# Patient Record
Sex: Female | Born: 1935
Health system: Southern US, Community
[De-identification: ages and names within clinical notes are randomized; demographics above are authoritative.]

## PROBLEM LIST (undated history)

## (undated) DIAGNOSIS — K559 Vascular disorder of intestine, unspecified: Secondary | ICD-10-CM

## (undated) DIAGNOSIS — H4020X0 Unspecified primary angle-closure glaucoma, stage unspecified: Secondary | ICD-10-CM

## (undated) DIAGNOSIS — F329 Major depressive disorder, single episode, unspecified: Secondary | ICD-10-CM

## (undated) DIAGNOSIS — G510 Bell's palsy: Secondary | ICD-10-CM

## (undated) DIAGNOSIS — M7060 Trochanteric bursitis, unspecified hip: Secondary | ICD-10-CM

## (undated) DIAGNOSIS — M810 Age-related osteoporosis without current pathological fracture: Secondary | ICD-10-CM

## (undated) DIAGNOSIS — N390 Urinary tract infection, site not specified: Secondary | ICD-10-CM

## (undated) DIAGNOSIS — F410 Panic disorder [episodic paroxysmal anxiety] without agoraphobia: Secondary | ICD-10-CM

## (undated) DIAGNOSIS — I809 Phlebitis and thrombophlebitis of unspecified site: Secondary | ICD-10-CM

## (undated) DIAGNOSIS — I499 Cardiac arrhythmia, unspecified: Secondary | ICD-10-CM

## (undated) DIAGNOSIS — K589 Irritable bowel syndrome without diarrhea: Secondary | ICD-10-CM

## (undated) DIAGNOSIS — G20 Parkinson's disease: Secondary | ICD-10-CM

## (undated) DIAGNOSIS — K269 Duodenal ulcer, unspecified as acute or chronic, without hemorrhage or perforation: Secondary | ICD-10-CM

## (undated) DIAGNOSIS — I493 Ventricular premature depolarization: Secondary | ICD-10-CM

## (undated) DIAGNOSIS — M797 Fibromyalgia: Secondary | ICD-10-CM

## (undated) DIAGNOSIS — N83202 Unspecified ovarian cyst, left side: Secondary | ICD-10-CM

## (undated) DIAGNOSIS — B029 Zoster without complications: Secondary | ICD-10-CM

## (undated) DIAGNOSIS — F32A Depression, unspecified: Secondary | ICD-10-CM

## (undated) DIAGNOSIS — I739 Peripheral vascular disease, unspecified: Secondary | ICD-10-CM

## (undated) DIAGNOSIS — R5381 Other malaise: Secondary | ICD-10-CM

## (undated) DIAGNOSIS — T7840XA Allergy, unspecified, initial encounter: Secondary | ICD-10-CM

## (undated) DIAGNOSIS — Z8719 Personal history of other diseases of the digestive system: Secondary | ICD-10-CM

## (undated) DIAGNOSIS — E039 Hypothyroidism, unspecified: Secondary | ICD-10-CM

## (undated) DIAGNOSIS — R5382 Chronic fatigue, unspecified: Secondary | ICD-10-CM

## (undated) DIAGNOSIS — K219 Gastro-esophageal reflux disease without esophagitis: Secondary | ICD-10-CM

## (undated) DIAGNOSIS — R109 Unspecified abdominal pain: Secondary | ICD-10-CM

## (undated) DIAGNOSIS — G8929 Other chronic pain: Secondary | ICD-10-CM

## (undated) DIAGNOSIS — G43109 Migraine with aura, not intractable, without status migrainosus: Secondary | ICD-10-CM

## (undated) DIAGNOSIS — G20A1 Parkinson's disease without dyskinesia, without mention of fluctuations: Secondary | ICD-10-CM

## (undated) DIAGNOSIS — F40240 Claustrophobia: Secondary | ICD-10-CM

## (undated) HISTORY — DX: Irritable bowel syndrome, unspecified: K58.9

## (undated) HISTORY — DX: Unspecified ovarian cyst, left side: N83.202

## (undated) HISTORY — DX: Migraine with aura, not intractable, without status migrainosus: G43.109

## (undated) HISTORY — DX: Bell's palsy: G51.0

## (undated) HISTORY — DX: Vascular disorder of intestine, unspecified: K55.9

## (undated) HISTORY — DX: Gastro-esophageal reflux disease without esophagitis: K21.9

## (undated) HISTORY — PX: FRACTURE SURGERY: SHX138

## (undated) HISTORY — PX: EYE SURGERY: SHX253

## (undated) HISTORY — PX: ABDOMINAL HYSTERECTOMY: SHX81

## (undated) HISTORY — PX: WISDOM TOOTH EXTRACTION: SHX21

## (undated) HISTORY — PX: OTHER SURGICAL HISTORY: SHX169

## (undated) HISTORY — DX: Urinary tract infection, site not specified: N39.0

## (undated) HISTORY — DX: Panic disorder (episodic paroxysmal anxiety): F41.0

## (undated) HISTORY — DX: Trochanteric bursitis, unspecified hip: M70.60

## (undated) HISTORY — PX: CATARACT EXTRACTION, BILATERAL: SHX1313

## (undated) HISTORY — DX: Major depressive disorder, single episode, unspecified: F32.9

## (undated) HISTORY — DX: Peripheral vascular disease, unspecified: I73.9

## (undated) HISTORY — DX: Allergy, unspecified, initial encounter: T78.40XA

## (undated) HISTORY — DX: Hypothyroidism, unspecified: E03.9

## (undated) HISTORY — DX: Duodenal ulcer, unspecified as acute or chronic, without hemorrhage or perforation: K26.9

## (undated) HISTORY — DX: Fibromyalgia: M79.7

## (undated) HISTORY — DX: Zoster without complications: B02.9

## (undated) HISTORY — DX: Age-related osteoporosis without current pathological fracture: M81.0

## (undated) HISTORY — DX: Unspecified primary angle-closure glaucoma, stage unspecified: H40.20X0

## (undated) HISTORY — DX: Depression, unspecified: F32.A

## (undated) HISTORY — DX: Phlebitis and thrombophlebitis of unspecified site: I80.9

## (undated) HISTORY — DX: Claustrophobia: F40.240

---
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
## 1960-07-24 DIAGNOSIS — K269 Duodenal ulcer, unspecified as acute or chronic, without hemorrhage or perforation: Secondary | ICD-10-CM

## 1960-07-24 HISTORY — DX: Duodenal ulcer, unspecified as acute or chronic, without hemorrhage or perforation: K26.9

## 1964-07-24 DIAGNOSIS — G510 Bell's palsy: Secondary | ICD-10-CM

## 1964-07-24 HISTORY — DX: Bell's palsy: G51.0

## 1969-07-24 HISTORY — PX: VAGINAL HYSTERECTOMY: SHX2639

## 1997-07-24 DIAGNOSIS — B029 Zoster without complications: Secondary | ICD-10-CM

## 1997-07-24 HISTORY — DX: Zoster without complications: B02.9

## 2005-07-13 ENCOUNTER — Encounter: Admission: RE | Admit: 2005-07-13 | Discharge: 2005-07-13 | Payer: Self-pay | Admitting: Family Medicine

## 2005-07-25 ENCOUNTER — Encounter: Admission: RE | Admit: 2005-07-25 | Discharge: 2005-07-25 | Payer: Self-pay | Admitting: Family Medicine

## 2005-07-25 ENCOUNTER — Encounter (INDEPENDENT_AMBULATORY_CARE_PROVIDER_SITE_OTHER): Payer: Self-pay | Admitting: Specialist

## 2005-07-25 ENCOUNTER — Other Ambulatory Visit: Admission: RE | Admit: 2005-07-25 | Discharge: 2005-07-25 | Payer: Self-pay | Admitting: Interventional Radiology

## 2005-08-07 ENCOUNTER — Encounter (HOSPITAL_COMMUNITY): Admission: RE | Admit: 2005-08-07 | Discharge: 2005-11-05 | Payer: Self-pay | Admitting: Family Medicine

## 2005-09-21 HISTORY — PX: THYROIDECTOMY, PARTIAL: SHX18

## 2005-09-28 ENCOUNTER — Ambulatory Visit (HOSPITAL_COMMUNITY): Admission: RE | Admit: 2005-09-28 | Discharge: 2005-09-29 | Payer: Self-pay | Admitting: Surgery

## 2005-09-28 ENCOUNTER — Encounter (INDEPENDENT_AMBULATORY_CARE_PROVIDER_SITE_OTHER): Payer: Self-pay | Admitting: Specialist

## 2006-05-02 ENCOUNTER — Ambulatory Visit (HOSPITAL_COMMUNITY): Admission: RE | Admit: 2006-05-02 | Discharge: 2006-05-02 | Payer: Self-pay | Admitting: Family Medicine

## 2007-03-25 DIAGNOSIS — E039 Hypothyroidism, unspecified: Secondary | ICD-10-CM

## 2007-03-25 HISTORY — DX: Hypothyroidism, unspecified: E03.9

## 2007-05-07 ENCOUNTER — Ambulatory Visit (HOSPITAL_COMMUNITY): Admission: RE | Admit: 2007-05-07 | Discharge: 2007-05-07 | Payer: Self-pay | Admitting: Family Medicine

## 2007-12-09 ENCOUNTER — Encounter: Admission: RE | Admit: 2007-12-09 | Discharge: 2007-12-09 | Payer: Self-pay | Admitting: Family Medicine

## 2008-01-09 ENCOUNTER — Encounter: Admission: RE | Admit: 2008-01-09 | Discharge: 2008-01-09 | Payer: Self-pay | Admitting: Rheumatology

## 2008-05-07 ENCOUNTER — Ambulatory Visit (HOSPITAL_COMMUNITY): Admission: RE | Admit: 2008-05-07 | Discharge: 2008-05-07 | Payer: Self-pay | Admitting: Family Medicine

## 2008-07-24 DIAGNOSIS — I779 Disorder of arteries and arterioles, unspecified: Secondary | ICD-10-CM

## 2008-07-24 HISTORY — DX: Disorder of arteries and arterioles, unspecified: I77.9

## 2008-12-22 DIAGNOSIS — M7060 Trochanteric bursitis, unspecified hip: Secondary | ICD-10-CM

## 2008-12-22 HISTORY — DX: Trochanteric bursitis, unspecified hip: M70.60

## 2009-01-14 ENCOUNTER — Encounter: Admission: RE | Admit: 2009-01-14 | Discharge: 2009-01-14 | Payer: Self-pay | Admitting: Family Medicine

## 2009-03-24 DIAGNOSIS — I809 Phlebitis and thrombophlebitis of unspecified site: Secondary | ICD-10-CM

## 2009-03-24 HISTORY — DX: Phlebitis and thrombophlebitis of unspecified site: I80.9

## 2009-03-31 ENCOUNTER — Encounter: Admission: RE | Admit: 2009-03-31 | Discharge: 2009-03-31 | Payer: Self-pay | Admitting: Gastroenterology

## 2009-04-05 ENCOUNTER — Encounter: Admission: RE | Admit: 2009-04-05 | Discharge: 2009-04-05 | Payer: Self-pay | Admitting: *Deleted

## 2009-05-10 ENCOUNTER — Ambulatory Visit (HOSPITAL_COMMUNITY): Admission: RE | Admit: 2009-05-10 | Discharge: 2009-05-10 | Payer: Self-pay | Admitting: Internal Medicine

## 2010-03-11 ENCOUNTER — Ambulatory Visit (HOSPITAL_COMMUNITY): Admission: RE | Admit: 2010-03-11 | Discharge: 2010-03-11 | Payer: Self-pay | Admitting: Orthopedic Surgery

## 2010-04-23 DIAGNOSIS — M810 Age-related osteoporosis without current pathological fracture: Secondary | ICD-10-CM

## 2010-04-23 HISTORY — DX: Age-related osteoporosis without current pathological fracture: M81.0

## 2010-05-11 ENCOUNTER — Ambulatory Visit (HOSPITAL_COMMUNITY): Admission: RE | Admit: 2010-05-11 | Discharge: 2010-05-11 | Payer: Self-pay | Admitting: Internal Medicine

## 2010-08-14 ENCOUNTER — Encounter: Payer: Self-pay | Admitting: Orthopedic Surgery

## 2010-09-28 ENCOUNTER — Other Ambulatory Visit: Payer: Self-pay | Admitting: Dermatology

## 2010-12-09 NOTE — Op Note (Signed)
Tracy Malone, Tracy Malone               ACCOUNT NO.:  [id number]   MEDICAL RECORD NO.:  [id number]          PATIENT TYPE:  OIB   LOCATION:  5712                         FACILITY:  [facility]   PHYSICIAN:  Velora Heckler, MD      DATE OF BIRTH:  07/30/1931   DATE OF PROCEDURE:  09/28/2005  DATE OF DISCHARGE:                                 OPERATIVE REPORT   PREOPERATIVE DIAGNOSIS:  Left thyroid mass.   POSTOPERATIVE DIAGNOSIS:  Left thyroid mass.   PROCEDURE:  Left thyroid lobectomy.   SURGEON:  Velora Heckler, M.D., FACS   ASSISTANT:  Wilmon Arms. Corliss Skains, M.D., FACS   ANESTHESIA:  General by Dr. Bedelia Person.   ESTIMATED BLOOD LOSS:  Minimal.   PREPARATION:  Betadine.   COMPLICATIONS:  None.   INDICATIONS:  The patient is a 77 year old white female who presents with  left thyroid mass.  The patient has mild compressive symptoms.  Fine-needle  aspiration biopsy was obtained and demonstrated a follicular lesion with  indeterminate cytopathology.  The patient now comes to surgery for resection  for definitive diagnosis and relief of compressive symptoms.   BODY OF REPORT:  Procedure was done in OR #10 at the [facility].  The patient is brought to the operating room and placed in supine  position on the operating room table.  Following the administration of  general anesthesia, the patient is positioned and then prepped and draped in  the usual strict aseptic fashion.  After ascertaining that an adequate level  of anesthesia had been obtained, a Kocher incision is made with a #15 blade.  Dissection is carried through subcutaneous tissues and platysma.  Hemostasis  is obtained with the electrocautery.  Skin flaps are developed cephalad and  caudad from the thyroid notch to the sternal notch.  A Mahorner self-  retaining retractor is placed for exposure.  Strap muscles are incised in  the midline and dissection is carried down to the thyroid.  Strap muscles  are  reflected laterally and the left thyroid lobe is exposed.  There is a  large mass measuring approximately 4.5 to 5 cm in greatest dimension  occupying the inferior portion of the left thyroid lobe.  This is gently  mobilized with blunt dissection.  Venous tributaries are divided between  small and medium Ligaclips.  Large inferior venous tributaries are ligated  in continuity with 2-0 silk ties and divided.  Gland is rolled medially.  Superior pole vessels are ligated in continuity between 2-0 silk ties and  medium Ligaclips and divided.  Gland is rolled further anteriorly.  The  superior parathyroid gland is identified and preserved on its vascular  pedicle.  Branches of the inferior thyroid artery are divided between small  Ligaclips.  Dissection is carried down to the tubercle of Zuckerkandl.  This  is gently mobilized, taking care to avoid the recurrent nerve.  Inferior  parathyroid tissue is identified and preserved.  Ligament of Allyson Sabal is  transected with the electrocautery and the gland is rolled up and onto the  anterior trachea.  Isthmus is divided between hemostats and suture-ligated  with 3-0 Vicryl suture ligatures.  Specimen is submitted for frozen section.  Dr. Jimmy Picket reviewed the specimen and felt this represented a primarily  cystic lesion with a follicular component, but no sign of malignancy was  identified.  Left neck was irrigated with warm saline.  Good hemostasis was  obtained.  Surgicel was placed over the area of the parathyroid glands and  recurrent nerve.  Right thyroid lobe was inspected.  There were no dominant  nodules or masses.  There was no gross abnormality.  There was no palpable  lymphadenopathy in the central compartment of the neck.  Strap muscles were  reapproximated in the midline with interrupted 3-0 Vicryl sutures.  Platysma  is closed with interrupted 3-0 Vicryl sutures.  Skin is closed with a  running 4-0 Vicryl subcuticular suture.  Wound is  washed and dried and  Benzoin and Steri-Strips are applied.  Sterile dressings are applied.  The  patient is awakened from anesthesia and brought to the recovery room in  stable condition.  The patient tolerated the procedure well.      Velora Heckler, MD  Electronically Signed     TMG/MEDQ  D:  09/28/2005  T:  09/29/2005  Job:  [id number]   cc:   Lavonda Jumbo, M.D.  Fax: [phone]

## 2011-08-02 DIAGNOSIS — H6120 Impacted cerumen, unspecified ear: Secondary | ICD-10-CM | POA: Diagnosis not present

## 2011-08-02 DIAGNOSIS — F3289 Other specified depressive episodes: Secondary | ICD-10-CM | POA: Diagnosis not present

## 2011-08-02 DIAGNOSIS — F329 Major depressive disorder, single episode, unspecified: Secondary | ICD-10-CM | POA: Diagnosis not present

## 2011-08-02 DIAGNOSIS — I6529 Occlusion and stenosis of unspecified carotid artery: Secondary | ICD-10-CM | POA: Diagnosis not present

## 2011-08-02 DIAGNOSIS — G43109 Migraine with aura, not intractable, without status migrainosus: Secondary | ICD-10-CM | POA: Diagnosis not present

## 2011-08-03 DIAGNOSIS — M9981 Other biomechanical lesions of cervical region: Secondary | ICD-10-CM | POA: Diagnosis not present

## 2011-08-03 DIAGNOSIS — M999 Biomechanical lesion, unspecified: Secondary | ICD-10-CM | POA: Diagnosis not present

## 2011-08-03 DIAGNOSIS — M546 Pain in thoracic spine: Secondary | ICD-10-CM | POA: Diagnosis not present

## 2011-08-03 DIAGNOSIS — M5030 Other cervical disc degeneration, unspecified cervical region: Secondary | ICD-10-CM | POA: Diagnosis not present

## 2011-08-08 DIAGNOSIS — H9209 Otalgia, unspecified ear: Secondary | ICD-10-CM | POA: Diagnosis not present

## 2011-08-09 DIAGNOSIS — I6529 Occlusion and stenosis of unspecified carotid artery: Secondary | ICD-10-CM | POA: Diagnosis not present

## 2011-08-15 DIAGNOSIS — H9319 Tinnitus, unspecified ear: Secondary | ICD-10-CM | POA: Diagnosis not present

## 2011-08-21 DIAGNOSIS — M999 Biomechanical lesion, unspecified: Secondary | ICD-10-CM | POA: Diagnosis not present

## 2011-08-21 DIAGNOSIS — M5030 Other cervical disc degeneration, unspecified cervical region: Secondary | ICD-10-CM | POA: Diagnosis not present

## 2011-08-21 DIAGNOSIS — M9981 Other biomechanical lesions of cervical region: Secondary | ICD-10-CM | POA: Diagnosis not present

## 2011-08-21 DIAGNOSIS — M546 Pain in thoracic spine: Secondary | ICD-10-CM | POA: Diagnosis not present

## 2011-08-22 DIAGNOSIS — Z961 Presence of intraocular lens: Secondary | ICD-10-CM | POA: Diagnosis not present

## 2011-08-22 DIAGNOSIS — G43909 Migraine, unspecified, not intractable, without status migrainosus: Secondary | ICD-10-CM | POA: Diagnosis not present

## 2011-08-23 DIAGNOSIS — H9319 Tinnitus, unspecified ear: Secondary | ICD-10-CM | POA: Diagnosis not present

## 2011-08-23 DIAGNOSIS — H905 Unspecified sensorineural hearing loss: Secondary | ICD-10-CM | POA: Diagnosis not present

## 2011-08-24 DIAGNOSIS — M546 Pain in thoracic spine: Secondary | ICD-10-CM | POA: Diagnosis not present

## 2011-08-24 DIAGNOSIS — M999 Biomechanical lesion, unspecified: Secondary | ICD-10-CM | POA: Diagnosis not present

## 2011-08-24 DIAGNOSIS — M5030 Other cervical disc degeneration, unspecified cervical region: Secondary | ICD-10-CM | POA: Diagnosis not present

## 2011-08-24 DIAGNOSIS — M9981 Other biomechanical lesions of cervical region: Secondary | ICD-10-CM | POA: Diagnosis not present

## 2011-09-04 DIAGNOSIS — M5030 Other cervical disc degeneration, unspecified cervical region: Secondary | ICD-10-CM | POA: Diagnosis not present

## 2011-09-04 DIAGNOSIS — M999 Biomechanical lesion, unspecified: Secondary | ICD-10-CM | POA: Diagnosis not present

## 2011-09-04 DIAGNOSIS — M9981 Other biomechanical lesions of cervical region: Secondary | ICD-10-CM | POA: Diagnosis not present

## 2011-09-04 DIAGNOSIS — M546 Pain in thoracic spine: Secondary | ICD-10-CM | POA: Diagnosis not present

## 2011-10-09 DIAGNOSIS — M9981 Other biomechanical lesions of cervical region: Secondary | ICD-10-CM | POA: Diagnosis not present

## 2011-10-09 DIAGNOSIS — M999 Biomechanical lesion, unspecified: Secondary | ICD-10-CM | POA: Diagnosis not present

## 2011-10-09 DIAGNOSIS — M5030 Other cervical disc degeneration, unspecified cervical region: Secondary | ICD-10-CM | POA: Diagnosis not present

## 2011-10-09 DIAGNOSIS — M546 Pain in thoracic spine: Secondary | ICD-10-CM | POA: Diagnosis not present

## 2011-10-10 DIAGNOSIS — M999 Biomechanical lesion, unspecified: Secondary | ICD-10-CM | POA: Diagnosis not present

## 2011-10-10 DIAGNOSIS — M9981 Other biomechanical lesions of cervical region: Secondary | ICD-10-CM | POA: Diagnosis not present

## 2011-10-10 DIAGNOSIS — M5030 Other cervical disc degeneration, unspecified cervical region: Secondary | ICD-10-CM | POA: Diagnosis not present

## 2011-10-10 DIAGNOSIS — M546 Pain in thoracic spine: Secondary | ICD-10-CM | POA: Diagnosis not present

## 2011-11-17 DIAGNOSIS — L821 Other seborrheic keratosis: Secondary | ICD-10-CM | POA: Diagnosis not present

## 2011-11-17 DIAGNOSIS — D239 Other benign neoplasm of skin, unspecified: Secondary | ICD-10-CM | POA: Diagnosis not present

## 2011-12-21 DIAGNOSIS — M9981 Other biomechanical lesions of cervical region: Secondary | ICD-10-CM | POA: Diagnosis not present

## 2011-12-21 DIAGNOSIS — M5030 Other cervical disc degeneration, unspecified cervical region: Secondary | ICD-10-CM | POA: Diagnosis not present

## 2011-12-21 DIAGNOSIS — M999 Biomechanical lesion, unspecified: Secondary | ICD-10-CM | POA: Diagnosis not present

## 2011-12-21 DIAGNOSIS — M546 Pain in thoracic spine: Secondary | ICD-10-CM | POA: Diagnosis not present

## 2011-12-28 DIAGNOSIS — N390 Urinary tract infection, site not specified: Secondary | ICD-10-CM | POA: Diagnosis not present

## 2012-01-23 DIAGNOSIS — M999 Biomechanical lesion, unspecified: Secondary | ICD-10-CM | POA: Diagnosis not present

## 2012-01-23 DIAGNOSIS — M9981 Other biomechanical lesions of cervical region: Secondary | ICD-10-CM | POA: Diagnosis not present

## 2012-01-23 DIAGNOSIS — M5030 Other cervical disc degeneration, unspecified cervical region: Secondary | ICD-10-CM | POA: Diagnosis not present

## 2012-01-23 DIAGNOSIS — M545 Low back pain, unspecified: Secondary | ICD-10-CM | POA: Diagnosis not present

## 2012-01-24 DIAGNOSIS — M5030 Other cervical disc degeneration, unspecified cervical region: Secondary | ICD-10-CM | POA: Diagnosis not present

## 2012-01-24 DIAGNOSIS — M9981 Other biomechanical lesions of cervical region: Secondary | ICD-10-CM | POA: Diagnosis not present

## 2012-01-24 DIAGNOSIS — M545 Low back pain, unspecified: Secondary | ICD-10-CM | POA: Diagnosis not present

## 2012-01-24 DIAGNOSIS — M999 Biomechanical lesion, unspecified: Secondary | ICD-10-CM | POA: Diagnosis not present

## 2012-01-29 DIAGNOSIS — I779 Disorder of arteries and arterioles, unspecified: Secondary | ICD-10-CM | POA: Diagnosis not present

## 2012-01-29 DIAGNOSIS — F329 Major depressive disorder, single episode, unspecified: Secondary | ICD-10-CM | POA: Diagnosis not present

## 2012-01-29 DIAGNOSIS — R5381 Other malaise: Secondary | ICD-10-CM | POA: Diagnosis not present

## 2012-01-29 DIAGNOSIS — R0789 Other chest pain: Secondary | ICD-10-CM | POA: Diagnosis not present

## 2012-01-29 DIAGNOSIS — N390 Urinary tract infection, site not specified: Secondary | ICD-10-CM | POA: Diagnosis not present

## 2012-01-29 DIAGNOSIS — F411 Generalized anxiety disorder: Secondary | ICD-10-CM | POA: Diagnosis not present

## 2012-02-01 DIAGNOSIS — M5030 Other cervical disc degeneration, unspecified cervical region: Secondary | ICD-10-CM | POA: Diagnosis not present

## 2012-02-01 DIAGNOSIS — M545 Low back pain, unspecified: Secondary | ICD-10-CM | POA: Diagnosis not present

## 2012-02-01 DIAGNOSIS — M9981 Other biomechanical lesions of cervical region: Secondary | ICD-10-CM | POA: Diagnosis not present

## 2012-02-01 DIAGNOSIS — M999 Biomechanical lesion, unspecified: Secondary | ICD-10-CM | POA: Diagnosis not present

## 2012-03-13 DIAGNOSIS — M9981 Other biomechanical lesions of cervical region: Secondary | ICD-10-CM | POA: Diagnosis not present

## 2012-03-13 DIAGNOSIS — M999 Biomechanical lesion, unspecified: Secondary | ICD-10-CM | POA: Diagnosis not present

## 2012-03-13 DIAGNOSIS — M545 Low back pain, unspecified: Secondary | ICD-10-CM | POA: Diagnosis not present

## 2012-03-13 DIAGNOSIS — M5030 Other cervical disc degeneration, unspecified cervical region: Secondary | ICD-10-CM | POA: Diagnosis not present

## 2012-03-14 DIAGNOSIS — M999 Biomechanical lesion, unspecified: Secondary | ICD-10-CM | POA: Diagnosis not present

## 2012-03-14 DIAGNOSIS — M5030 Other cervical disc degeneration, unspecified cervical region: Secondary | ICD-10-CM | POA: Diagnosis not present

## 2012-03-14 DIAGNOSIS — M545 Low back pain, unspecified: Secondary | ICD-10-CM | POA: Diagnosis not present

## 2012-03-14 DIAGNOSIS — M9981 Other biomechanical lesions of cervical region: Secondary | ICD-10-CM | POA: Diagnosis not present

## 2012-03-15 DIAGNOSIS — Z01419 Encounter for gynecological examination (general) (routine) without abnormal findings: Secondary | ICD-10-CM | POA: Diagnosis not present

## 2012-04-01 DIAGNOSIS — F411 Generalized anxiety disorder: Secondary | ICD-10-CM | POA: Diagnosis not present

## 2012-04-01 DIAGNOSIS — M25519 Pain in unspecified shoulder: Secondary | ICD-10-CM | POA: Diagnosis not present

## 2012-04-02 DIAGNOSIS — M25519 Pain in unspecified shoulder: Secondary | ICD-10-CM | POA: Diagnosis not present

## 2012-04-12 DIAGNOSIS — R197 Diarrhea, unspecified: Secondary | ICD-10-CM | POA: Diagnosis not present

## 2012-04-12 DIAGNOSIS — M549 Dorsalgia, unspecified: Secondary | ICD-10-CM | POA: Diagnosis not present

## 2012-04-15 DIAGNOSIS — M5030 Other cervical disc degeneration, unspecified cervical region: Secondary | ICD-10-CM | POA: Diagnosis not present

## 2012-04-15 DIAGNOSIS — M545 Low back pain, unspecified: Secondary | ICD-10-CM | POA: Diagnosis not present

## 2012-04-15 DIAGNOSIS — M9981 Other biomechanical lesions of cervical region: Secondary | ICD-10-CM | POA: Diagnosis not present

## 2012-04-15 DIAGNOSIS — M999 Biomechanical lesion, unspecified: Secondary | ICD-10-CM | POA: Diagnosis not present

## 2012-04-22 ENCOUNTER — Encounter (HOSPITAL_COMMUNITY)
Admission: RE | Admit: 2012-04-22 | Discharge: 2012-04-22 | Disposition: A | Discharge status: Home / Self Care | Payer: Medicare Other | Source: Ambulatory Visit | Attending: Geriatric Medicine | Admitting: Geriatric Medicine

## 2012-04-22 ENCOUNTER — Other Ambulatory Visit: Payer: Self-pay | Admitting: Geriatric Medicine

## 2012-04-22 ENCOUNTER — Other Ambulatory Visit: Payer: Self-pay

## 2012-04-22 ENCOUNTER — Other Ambulatory Visit (HOSPITAL_COMMUNITY): Payer: Self-pay | Admitting: Geriatric Medicine

## 2012-04-22 DIAGNOSIS — R7989 Other specified abnormal findings of blood chemistry: Secondary | ICD-10-CM

## 2012-04-22 DIAGNOSIS — R079 Chest pain, unspecified: Secondary | ICD-10-CM | POA: Diagnosis not present

## 2012-04-22 DIAGNOSIS — R059 Cough, unspecified: Secondary | ICD-10-CM

## 2012-04-22 DIAGNOSIS — R0789 Other chest pain: Secondary | ICD-10-CM

## 2012-04-22 DIAGNOSIS — R791 Abnormal coagulation profile: Secondary | ICD-10-CM | POA: Insufficient documentation

## 2012-04-22 DIAGNOSIS — R05 Cough: Secondary | ICD-10-CM

## 2012-04-22 DIAGNOSIS — R071 Chest pain on breathing: Secondary | ICD-10-CM | POA: Diagnosis not present

## 2012-04-22 DIAGNOSIS — M545 Low back pain, unspecified: Secondary | ICD-10-CM | POA: Diagnosis not present

## 2012-04-22 DIAGNOSIS — R030 Elevated blood-pressure reading, without diagnosis of hypertension: Secondary | ICD-10-CM | POA: Diagnosis not present

## 2012-04-22 DIAGNOSIS — R109 Unspecified abdominal pain: Secondary | ICD-10-CM | POA: Diagnosis not present

## 2012-04-22 MED ORDER — TECHNETIUM TO 99M ALBUMIN AGGREGATED
3.0000 | Freq: Once | INTRAVENOUS | Status: AC | PRN
  Administered 2012-04-22: 3 via INTRAVENOUS

## 2012-04-22 MED ORDER — XENON XE 133 GAS
15.0000 | GAS_FOR_INHALATION | Freq: Once | RESPIRATORY_TRACT | Status: AC | PRN
  Administered 2012-04-22: 15 via RESPIRATORY_TRACT

## 2012-05-02 DIAGNOSIS — R05 Cough: Secondary | ICD-10-CM | POA: Diagnosis not present

## 2012-05-02 DIAGNOSIS — R059 Cough, unspecified: Secondary | ICD-10-CM | POA: Diagnosis not present

## 2012-05-03 DIAGNOSIS — M545 Low back pain, unspecified: Secondary | ICD-10-CM | POA: Diagnosis not present

## 2012-05-03 DIAGNOSIS — IMO0002 Reserved for concepts with insufficient information to code with codable children: Secondary | ICD-10-CM | POA: Diagnosis not present

## 2012-05-03 DIAGNOSIS — M549 Dorsalgia, unspecified: Secondary | ICD-10-CM | POA: Diagnosis not present

## 2012-05-13 ENCOUNTER — Ambulatory Visit (INDEPENDENT_AMBULATORY_CARE_PROVIDER_SITE_OTHER): Payer: Medicare Other | Admitting: Family Medicine

## 2012-05-13 ENCOUNTER — Encounter: Payer: Self-pay | Admitting: *Deleted

## 2012-05-13 ENCOUNTER — Encounter: Payer: Self-pay | Admitting: Family Medicine

## 2012-05-13 VITALS — BP 132/70 | HR 48 | Temp 98.1°F | Ht 59.0 in | Wt 117.0 lb

## 2012-05-13 DIAGNOSIS — R5381 Other malaise: Secondary | ICD-10-CM | POA: Diagnosis not present

## 2012-05-13 DIAGNOSIS — F411 Generalized anxiety disorder: Secondary | ICD-10-CM | POA: Insufficient documentation

## 2012-05-13 DIAGNOSIS — R5383 Other fatigue: Secondary | ICD-10-CM

## 2012-05-13 DIAGNOSIS — M8580 Other specified disorders of bone density and structure, unspecified site: Secondary | ICD-10-CM | POA: Insufficient documentation

## 2012-05-13 DIAGNOSIS — M899 Disorder of bone, unspecified: Secondary | ICD-10-CM | POA: Diagnosis not present

## 2012-05-13 DIAGNOSIS — M949 Disorder of cartilage, unspecified: Secondary | ICD-10-CM | POA: Diagnosis not present

## 2012-05-13 DIAGNOSIS — E039 Hypothyroidism, unspecified: Secondary | ICD-10-CM | POA: Diagnosis not present

## 2012-05-13 DIAGNOSIS — R1013 Epigastric pain: Secondary | ICD-10-CM | POA: Diagnosis not present

## 2012-05-13 NOTE — Progress Notes (Signed)
Chief Complaint  Patient presents with  . Advice Only    abdominal burning and sweeling since 04/04/12. Wakes from her sleep most nights sweaty and cold from her neck up. Also has a cough mainly when she lays down.   HPI: 9/12 went to [facility] and got depo medrol into right shoulder joint.  She was feeling fine until that point, but 2 days later started feeling very tired, upper abdominal swelling, burning sensation in upper abdomen, radiating to her back.  Waking up at night sweating from neck up, head soaking wet.  Had a strange cough, when lying down (dry, hacky). Started feeling pulsations across her upper abdomen, which radiated to the back.  Finally, on 9/20 she saw Dr. Kirby Funk, was told "must be a viral infection".  9/30 she saw Dr. Pete Glatter, who had a positive D-dimer, and sent her for a CT scan, which was negative(actually, upon review of chart, didn't have CT due to allergy to contrast, but had nuclear med v/q scan instead, which was normal).  No other bloodwork was done.  10/10 she saw Dr. Earl Gala, who diagnosed her with acid reflux.  This feels completely different to the acid reflux she previously had.  He gave her Nexium samples, but had to stop after 5 days due to severe diarrhea.  She then changed to Prilosec, but also had diarrhea from the Prilosec.  Doesn't recall any improvement in her symptoms while taking the PPI.  She sleeps with head of bed elevated.  On 10/10 she laid on her back to sleep, and the next day she woke up unable to straighten her back, and pain into her legs (she never sleeps on her back) . She saw PA at [facility], had x-rays of back, told normal, and she was given 2 shots of depo medrol, into buttocks.  Felt better (painwise) a few days later.  Ongoing upper abdominal fullness, swelling, pulsating (noticed more when lying or sitting).  It is no longer burning.  She hasn't had a BM x 4 days, since she took immodium after having diarrhea from the PPI's.  Ongoing  fatigue.  Can't recall when last TSH was.  She reports taking the of Synthroid on Sat, Sun, Mon, and taking 50 mcg the rest of the week.  Anxiety--She doesn't want to take xanax anymore.  Has been taking it about every day over the last month, taking 1/2 in morning, 1 at night.  Takes the medication for anxiety.  Denies feeling depressed, but reports feeling "disgusted" that she can't do the things she wants to do.  Hasn't taken a xanax since Saturday. Prior to this past month, when she has been feeling ill, she was only using xanax about twice a month. Recalls that Zoloft in the past gave her ocular migraines.  Prozac made her very jittery in the past.  Past Medical History  Diagnosis Date  . Depression     treated in the past for years;stopped in 2010 for a years  . Panic attack   . Claustrophobia   . Fibromyalgia   . IBS (irritable bowel syndrome)   . Hypercholesteremia     with high HDL  . Glaucoma, narrow-angle     s/p laser surgery  . Duodenal ulcer 1962    h/o  . Hyperthyroidism     h/o x 2 years  . Shingles 1999    h/o  . Hypothyroid 9/08  . GERD (gastroesophageal reflux disease)   . Trochanteric bursitis 12/2008  bilateral  . Superficial thrombophlebitis 03/2009    RLE  . Bell's palsy 1966  . Osteoporosis 10/11    Dr.Hawkes  . SLE (systemic lupus erythematosus)     with arthralgia/myalgia, positive ANA centromere pattern, positive DNA, positive RNP, indeterminant anti-cardiolipin antibody-Dr.Hawkes.  . Carotid artery disease 2010    on vascular screening;unchanged 2013.(could not tolerate simvastatin, no other statins tried)  . Recurrent UTI     has cystocele-Dr.Grewal   Past Surgical History  Procedure Date  . Tonsillectomy 1946  . Vaginal hysterectomy 1971    and bladder repair.  Still has ovaries  . Cataract extraction, bilateral 1995, 1996  . Thyroidectomy, partial 09/2005    L nodule; Dr. Gerrit Friends   History   Social History  . Marital Status:  Married    Spouse Name: N/A    Number of Children: 2  . Years of Education: N/A   Occupational History  . retired (school system)    Social History Main Topics  . Smoking status: Never Smoker   . Smokeless tobacco: Never Used  . Alcohol Use: No  . Drug Use: No  . Sexually Active: Not on file   Other Topics Concern  . Not on file   Social History Narrative   Married.  Son lives in [place]; Daughter [name] lives in [place]; 2 grandchildren   Family History  Problem Relation Age of Onset  . Heart disease Mother   . Hypertension Mother   . Hypertension Sister   . Diabetes Maternal Grandfather   . Heart disease Brother   . Cancer Brother     lung   Current outpatient prescriptions:Acetaminophen (TYLENOL ARTHRITIS EXT RELIEF PO), Take 500 mg by mouth daily., Disp: , Rfl: ;  aspirin 81 MG tablet, Take 81 mg by mouth daily., Disp: , Rfl: ;  b complex vitamins capsule, Take 1 capsule by mouth 3 (three) times daily. , Disp: , Rfl: ;  cholecalciferol (VITAMIN D) 1000 UNITS tablet, Take 2,000 Units by mouth daily. , Disp: , Rfl:  levothyroxine (SYNTHROID) 25 MCG tablet, Take 25 mcg by mouth daily., Disp: , Rfl: ;  levothyroxine (SYNTHROID) 50 MCG tablet, Take 50 mcg by mouth daily., Disp: , Rfl: ;  Probiotic Product (PROBIOTIC DAILY) CAPS, Take 1 capsule by mouth daily., Disp: , Rfl: ;  vitamin E 400 UNIT capsule, Take 400 Units by mouth daily., Disp: , Rfl: ;  ALPRAZolam (XANAX) 0.5 MG tablet, Take 0.25-0.5 mg by mouth as needed., Disp: , Rfl:  ARTIFICIAL TEAR OP, Apply 1-2 drops to eye., Disp: , Rfl:   Allergies  Allergen Reactions  . Salmon (Fish Allergy) Hives and Shortness Of Breath  . Aspirin Other (See Comments)    Sever stomach pain due to ulcer scaring.  . Ciprofloxacin Diarrhea  . Codeine Nausea And Vomiting  . Darvocet (Propoxyphene-Acetaminophen) Nausea And Vomiting  . Demerol (Meperidine) Nausea And Vomiting  . Diphedryl (Diphenhydramine) Other (See Comments)    Increased  pulse/small amount ok  . Doxycycline Hyclate Other (See Comments)    GI intolerance.  Marland Kitchen Epinephrine Other (See Comments)    Breathing problems  . Erythromycin Other (See Comments)    GI intolerance.  . Flexeril (Cyclobenzaprine) Other (See Comments)    Tingly/prickly sensation.  . Iodine   . Keflex (Cephalexin) Hives  . Latex Other (See Comments)    Gloves ok  . Penicillins   . Prednisone Other (See Comments)    Headache  . Shellfish Allergy   . Sulfa Antibiotics  Other (See Comments)    Increased pulse, fainting, diarrhea  . Xylocaine (Lidocaine Hcl)   . Zoloft (Sertraline Hcl) Other (See Comments)    Migraine   . Advil (Ibuprofen) Rash    Motrin ok with a GI effect.   ROS: Never had bloody stool or black BM's.  Some nausea early on, resolved.  No vomiting.  Denies urinary complaints, vaginal discharge.  Joint pains improved--no shoulder or back pain.  Uses tylenol as needed for pain.  Denies any fevers.  Has had some weight loss--recalls being 121 at beginning of illness. Some decrease in appetite. Denies sick contacts.  Denies URI symptoms; +dry cough, worse when lying down, no shortness of breath.    PHYSICAL EXAM: BP 132/70  Pulse 48  Temp 98.1 F (36.7 C) (Oral)  Ht 4\' 11"  (1.499 m)  Wt 117 lb (53.071 kg)  BMI 23.63 kg/m2 Well developed, pleasant female, mildly anxious, in no acute distress HEENT:  PERRL, conjunctiva clear, OP clear Neck: no lymphadenopathy, thyromegaly, carotid bruit Heart: regular rate and rhythm, bradycardic.  Extra heart sound noted.  No murmur Lungs: clear bilaterally Back: no spine or CVA tenderness Abdomen: soft.  No pulsatile mass.  +Epigastric tenderness. No rebound tenderness or guarding, no hepatomegaly or mass Extremities: no edema, 2+ pulse Psych: anxious, normal eye contact, speech, hygiene and grooming Neuro: alert and oriented.  Cranial nerves grossly intact, normal gait.  ASSESSMENT/PLAN: 1. Epigastric abdominal pain   Comprehensive metabolic panel, CBC with Differential, HELICOBACTER PYLORI  ANTIBODY, IGM  2. Other malaise and fatigue  Comprehensive metabolic panel, CBC with Differential, HELICOBACTER PYLORI  ANTIBODY, IGM  3. Unspecified hypothyroidism  TSH  4. Osteopenia  Vitamin D 25 hydroxy  5. Anxiety state, unspecified     History of epigastric burning with dry cough when supine is suggestive of reflux.  Denies improvement (although burning has improved) with PPI, and didn't tolerate them due to diarrhea. Fatigue--check labs CBC, TSH, c-met, Vitamin D H pylori Ab--Ig M (has h/o of being treated for H pylori in past)  Anxiety--discussed Cymbalta in detail--has no medication insurance coverage.  So, will try citalopram instead.  Risks/side effects and reason to start at 1/2 tablet were reviewed.  Will await lab results (to r/o thyroid disease) prior to starting new medications.  Extra heart sound--no record of this in records received from [facility]. I doubt this relates to her current complaints, but should be evaluated with echocardiogram.  Also consider abdominal u/s to evaluate for AAA given her symptoms of upper stomach pulsing (normal exam today, no pulsatile mass noted).  Await lab results and set up these studies.

## 2012-05-14 LAB — COMPREHENSIVE METABOLIC PANEL
AST: 18 U/L (ref 0–37)
Alkaline Phosphatase: 66 U/L (ref 39–117)
BUN: 17 mg/dL (ref 6–23)
Creat: 0.67 mg/dL (ref 0.50–1.10)

## 2012-05-14 LAB — CBC WITH DIFFERENTIAL/PLATELET
Basophils Relative: 0 % (ref 0–1)
Eosinophils Absolute: 0.1 10*3/uL (ref 0.0–0.7)
HCT: 43.7 % (ref 36.0–46.0)
Hemoglobin: 15.1 g/dL — ABNORMAL HIGH (ref 12.0–15.0)
MCH: 30.6 pg (ref 26.0–34.0)
MCHC: 34.6 g/dL (ref 30.0–36.0)
Monocytes Absolute: 1.9 10*3/uL — ABNORMAL HIGH (ref 0.1–1.0)
Monocytes Relative: 11 % (ref 3–12)
Neutro Abs: 13.7 10*3/uL — ABNORMAL HIGH (ref 1.7–7.7)

## 2012-05-14 LAB — VITAMIN D 25 HYDROXY (VIT D DEFICIENCY, FRACTURES): Vit D, 25-Hydroxy: 38 ng/mL (ref 30–89)

## 2012-05-14 LAB — TSH: TSH: 1.497 u[IU]/mL (ref 0.350–4.500)

## 2012-05-14 LAB — HELICOBACTER PYLORI  ANTIBODY, IGM: Helicobacter pylori, IgM: 14.7 U/mL — ABNORMAL HIGH (ref <9.0)

## 2012-05-15 NOTE — Progress Notes (Signed)
Quick Note:  Discussed with patient, after discussing with Dr. Elnoria Howard. Discussed that titers aren't really helpful/useful and wouldn't base treatment decision on them. Discussed options of stopping PPI and doing breath test, vs just referring to him, and possibly have EGD. She would like consult. Refer to Dr Elnoria Howard. Send medical records and labs. She has been taking prilosec OTC once daily, and is feeling a little better, but still having swelling/fullness as well as night sweats (above neck only). Started having some diarrhea from the prilosec, so took 1/2 imodium.  Also, please set up echocardiogram (dx extra heart sound, bradycardia) ______

## 2012-05-16 ENCOUNTER — Other Ambulatory Visit: Payer: Self-pay | Admitting: *Deleted

## 2012-05-16 DIAGNOSIS — R001 Bradycardia, unspecified: Secondary | ICD-10-CM

## 2012-05-20 DIAGNOSIS — R142 Eructation: Secondary | ICD-10-CM | POA: Diagnosis not present

## 2012-05-20 DIAGNOSIS — F411 Generalized anxiety disorder: Secondary | ICD-10-CM | POA: Diagnosis not present

## 2012-05-20 DIAGNOSIS — R141 Gas pain: Secondary | ICD-10-CM | POA: Diagnosis not present

## 2012-05-20 DIAGNOSIS — G4700 Insomnia, unspecified: Secondary | ICD-10-CM | POA: Diagnosis not present

## 2012-05-22 ENCOUNTER — Telehealth: Payer: Self-pay | Admitting: Internal Medicine

## 2012-05-22 ENCOUNTER — Ambulatory Visit (HOSPITAL_COMMUNITY): Payer: Medicare Other | Attending: Cardiology | Admitting: Radiology

## 2012-05-22 ENCOUNTER — Other Ambulatory Visit: Payer: Self-pay

## 2012-05-22 DIAGNOSIS — R002 Palpitations: Secondary | ICD-10-CM | POA: Diagnosis not present

## 2012-05-22 DIAGNOSIS — I2510 Atherosclerotic heart disease of native coronary artery without angina pectoris: Secondary | ICD-10-CM | POA: Insufficient documentation

## 2012-05-22 DIAGNOSIS — R011 Cardiac murmur, unspecified: Secondary | ICD-10-CM | POA: Diagnosis not present

## 2012-05-22 DIAGNOSIS — I059 Rheumatic mitral valve disease, unspecified: Secondary | ICD-10-CM | POA: Insufficient documentation

## 2012-05-22 DIAGNOSIS — I369 Nonrheumatic tricuspid valve disorder, unspecified: Secondary | ICD-10-CM | POA: Insufficient documentation

## 2012-05-22 DIAGNOSIS — R001 Bradycardia, unspecified: Secondary | ICD-10-CM

## 2012-05-22 MED ORDER — ALPRAZOLAM 0.5 MG PO TABS: 0.2500 mg | ORAL_TABLET | ORAL | Status: DC | PRN

## 2012-05-22 NOTE — Telephone Encounter (Signed)
Advise pt re: constipation--just be patient.  High fiber diet, prune juice, drink plenty of fluids.  The constipation is caused by imodium, and should resolve on its own.  If you are aggressive in treating the constipation, you'll just end up with diarrhea again--be patient and it should resolve on its own.  Okay to refill xanax 0.5mg , 1/2-1 tablet every 8 hours as needed for anxiety #30.  Tresa Garter is a muscle relaxer.  It sometimes makes people sleepy, but I don't recommend using it for sleep unless it is muscle pain/spasm that is keeping her from sleeping.  The xanax should help.  She can also try melatonin if she needs something in addition to the xanax.

## 2012-05-22 NOTE — Telephone Encounter (Signed)
i have called in xanax to pharmacy. Waiting on pt to call me back so i can explain everything to her

## 2012-05-22 NOTE — Telephone Encounter (Signed)
Pt states that she is out of her xanax 0.5mg  and would like a refill sent to [facility]. In the past 2 days she has only slept 9 hours and she is exhausted. Pt also has generic soma 350mg  and wants to know if she can take that for sleep. Pt also states that Prilosec has given her diarrhea so she took imodium for it and the imodium made her constipated and what can she do to relief the constipation.

## 2012-05-22 NOTE — Progress Notes (Signed)
Echocardiogram performed.  

## 2012-05-22 NOTE — Telephone Encounter (Signed)
Pt notified and called in xanax

## 2012-06-05 ENCOUNTER — Encounter: Payer: Self-pay | Admitting: Family Medicine

## 2012-06-05 ENCOUNTER — Ambulatory Visit (INDEPENDENT_AMBULATORY_CARE_PROVIDER_SITE_OTHER): Payer: Medicare Other | Admitting: Family Medicine

## 2012-06-05 VITALS — BP 110/70 | HR 56 | Ht 59.0 in | Wt 117.0 lb

## 2012-06-05 DIAGNOSIS — N644 Mastodynia: Secondary | ICD-10-CM | POA: Diagnosis not present

## 2012-06-05 DIAGNOSIS — R109 Unspecified abdominal pain: Secondary | ICD-10-CM | POA: Diagnosis not present

## 2012-06-05 DIAGNOSIS — M797 Fibromyalgia: Secondary | ICD-10-CM

## 2012-06-05 DIAGNOSIS — IMO0001 Reserved for inherently not codable concepts without codable children: Secondary | ICD-10-CM

## 2012-06-05 DIAGNOSIS — R1013 Epigastric pain: Secondary | ICD-10-CM

## 2012-06-05 NOTE — Progress Notes (Signed)
Chief Complaint  Patient presents with  . Advice Only    having abdominal swelling. Not eating and not sleeping well.   Patient presents accompanied by her husband for follow-up on her abdominal pain.  She continues to have decreased appetite, not eating much.  Husband is concerned that she isn't eating enough, seems week, ?losing weight. Not really as active or doing as much as usual.  Gets swelling across her upper abdomen with eating "heavier" meals.  Sometimes has nausea.  Infrequent heartburn.  Took Pepcid Complete with the one episode of heartburn she had,  and caused more reflux/nausea.  She took prilosec x 6 days before it caused diarrhea.  Same thing had happened with Nexium.  Didn't notice any improvement with the prilosec.  Noticed some improvement with nexium the first 3 days she took it, but then it caused terrible diarrhea.  No PPI in 3 weeks.  Having stools 3-4x/day, and some diarrhea late in the afternoon, but not daily.  Denies blood or mucus in stool.  Denies fevers.    No longer is having the sweats (that was drenching her above the neck at her last visit). hacky cough when lying down is much less frequent. No longer having pulsating sensation in her upper abdomen when she lies down. But continues to have abdominal pain and swelling.  Also complaining of a burning, tingling pain in her left breast that recently started.  She is scheduled for a mammogram next week.  Denies nipple discharge, skin changes.  Past Medical History  Diagnosis Date  . Depression     treated in the past for years;stopped in 2010 for a years  . Panic attack   . Claustrophobia   . Fibromyalgia   . IBS (irritable bowel syndrome)   . Hypercholesteremia     with high HDL  . Glaucoma, narrow-angle     s/p laser surgery  . Duodenal ulcer 1962    h/o  . Hyperthyroidism     h/o x 2 years  . Shingles 1999    h/o  . Hypothyroid 9/08  . GERD (gastroesophageal reflux disease)   . Trochanteric  bursitis 12/2008    bilateral  . Superficial thrombophlebitis 03/2009    RLE  . Bell's palsy 1966  . Osteoporosis 10/11    Dr.Hawkes  . SLE (systemic lupus erythematosus)     with arthralgia/myalgia, positive ANA centromere pattern, positive DNA, positive RNP, indeterminant anti-cardiolipin antibody-Dr.Hawkes.  . Carotid artery disease 2010    on vascular screening;unchanged 2013.(could not tolerate simvastatin, no other statins tried)  . Recurrent UTI     has cystocele-Dr.Grewal   Past Surgical History  Procedure Date  . Tonsillectomy 1946  . Vaginal hysterectomy 1971    and bladder repair.  Still has ovaries  . Cataract extraction, bilateral 1995, 1996  . Thyroidectomy, partial 09/2005    L nodule; Dr. Gerrit Friends   History   Social History  . Marital Status: Married    Spouse Name: N/A    Number of Children: 2  . Years of Education: N/A   Occupational History  . retired (school system)    Social History Main Topics  . Smoking status: Never Smoker   . Smokeless tobacco: Never Used  . Alcohol Use: No  . Drug Use: No  . Sexually Active: Not on file   Other Topics Concern  . Not on file   Social History Narrative   Married.  Son lives in [place]; Daughter [name] lives in [place];  2 grandchildren   Current Outpatient Prescriptions on File Prior to Visit  Medication Sig Dispense Refill  . aspirin 81 MG tablet Take 81 mg by mouth daily.      . cholecalciferol (VITAMIN D) 1000 UNITS tablet Take 2,000 Units by mouth daily.       Marland Kitchen levothyroxine (SYNTHROID) 25 MCG tablet Take 25 mcg by mouth daily.      Marland Kitchen levothyroxine (SYNTHROID) 50 MCG tablet Take 50 mcg by mouth daily.      . Probiotic Product (PROBIOTIC DAILY) CAPS Take 1 capsule by mouth daily.      . vitamin E 400 UNIT capsule Take 400 Units by mouth daily.      . Acetaminophen (TYLENOL ARTHRITIS EXT RELIEF PO) Take 500 mg by mouth daily.      Marland Kitchen ALPRAZolam (XANAX) 0.5 MG tablet Take 0.5-1 tablets (0.25-0.5 mg total) by  mouth as needed.  30 tablet  0  . ARTIFICIAL TEAR OP Apply 1-2 drops to eye.      Marland Kitchen b complex vitamins capsule Take 1 capsule by mouth 3 (three) times daily.        Allergies  Allergen Reactions  . Salmon (Fish Allergy) Hives and Shortness Of Breath  . Aspirin Other (See Comments)    Sever stomach pain due to ulcer scaring.  . Ciprofloxacin Diarrhea  . Codeine Nausea And Vomiting  . Darvocet (Propoxyphene-Acetaminophen) Nausea And Vomiting  . Demerol (Meperidine) Nausea And Vomiting  . Diphedryl (Diphenhydramine) Other (See Comments)    Increased pulse/small amount ok  . Doxycycline Hyclate Other (See Comments)    GI intolerance.  Marland Kitchen Epinephrine Other (See Comments)    Breathing problems  . Erythromycin Other (See Comments)    GI intolerance.  . Flexeril (Cyclobenzaprine) Other (See Comments)    Tingly/prickly sensation.  . Iodine   . Keflex (Cephalexin) Hives  . Latex Other (See Comments)    Gloves ok  . Penicillins   . Prednisone Other (See Comments)    Headache  . Shellfish Allergy   . Sulfa Antibiotics Other (See Comments)    Increased pulse, fainting, diarrhea  . Xylocaine (Lidocaine Hcl)   . Zoloft (Sertraline Hcl) Other (See Comments)    Migraine   . Advil (Ibuprofen) Rash    Motrin ok with a GI effect.   ROS:  Denies fevers, headaches.  No URI symptoms, just occasional dry cough.  +abdominal pan, increased stool frequency/diarrhea, fatigue.  Denies bloody or black bowel movements, bleeding.  +back pain, +fibromyalgia with diffuse pain.  +depression/anxiety.  No skin rash/lesions (except breast complaint--see HPI)  PHYSICAL EXAM: BP 110/70  Pulse 56  Ht 4\' 11"  (1.499 m)  Wt 117 lb (53.071 kg)  BMI 23.63 kg/m2 Pleasant female, not appearing in any distress.  Appears stated age, perhaps slightly younger. Neck: no lymphadenopathy, thyromegaly or mass Left breast--mild tenderness medially.  Skin just slightly dry.  No nipple discharge or mass palpable. Heart:  regular rate and rhythm.  No extra heart sounds are noted today at all.  Rare ectopy. Abdomen: soft, normal bowel sounds.Tender at epigastrium and behind umbilicus, and slightly to the left. nontender at RUQ or LLQ.  No rebound tenderness.  Minimal guarding.  Soft abdomen. Extremities: no edema Skin: no rash  ASSESSMENT/PLAN:  1. Abdominal  pain, other specified site  CBC with Differential, US Abdomen Complete  2. Epigastric abdominal pain    3. Breast pain    4. Fibromyalgia     Abdominal u/s CBC (to  f/u on elevated WBC on last check--?demargination from steroids, vs elevated due to infectious process.  Recheck today.  Breast pain--has mammogram scheduled for next week--she will make sure tech is aware of her complaints  F/u with Dr. Elnoria Howard if no cause found.  Might need endoscopy (or breath test).  We discussed the results of H.pylori testing in detail, and the risks/side effects of meds for treatment--prefer to confirm +test prior to treating, if no other cause of her symptoms found.  Consider thoracic radiculopathy with vague symptoms  Consider starting cymbalta (for depression/anxiety/fibromyalgia/pain)--holding off due to side effect (ie nausea, abdominal pain) possibly making her worse.  Finish eval first.  Molli Knock to use xanax prn anxiety in the interim.  She is not needing refill at this time.  30 minute visit, more than 1/2 spent counseling.

## 2012-06-05 NOTE — Patient Instructions (Addendum)
Ensure that you continue to eat 3 meals/day, or 5 small meals/snacks.  Eat bland food, avoid dairy. If you continue to have abdominal complaints, follow up with Dr. Elnoria Howard.  ???could this be related to nerve pain from thoracic spine, causing vague abdominal symptoms??? Vs related somewhat to fibromyalgia (chronic pain).

## 2012-06-06 LAB — CBC WITH DIFFERENTIAL/PLATELET
Basophils Relative: 0 % (ref 0–1)
HCT: 43.9 % (ref 36.0–46.0)
Hemoglobin: 15.1 g/dL — ABNORMAL HIGH (ref 12.0–15.0)
Lymphocytes Relative: 23 % (ref 12–46)
Lymphs Abs: 2.7 10*3/uL (ref 0.7–4.0)
Monocytes Relative: 11 % (ref 3–12)
Neutro Abs: 7.6 10*3/uL (ref 1.7–7.7)
Neutrophils Relative %: 64 % (ref 43–77)
RBC: 5.02 MIL/uL (ref 3.87–5.11)
WBC: 12 10*3/uL — ABNORMAL HIGH (ref 4.0–10.5)

## 2012-06-10 ENCOUNTER — Ambulatory Visit
Admission: RE | Admit: 2012-06-10 | Discharge: 2012-06-10 | Disposition: A | Discharge status: Home / Self Care | Payer: Medicare Other | Source: Ambulatory Visit | Attending: Family Medicine | Admitting: Family Medicine

## 2012-06-10 DIAGNOSIS — N281 Cyst of kidney, acquired: Secondary | ICD-10-CM | POA: Diagnosis not present

## 2012-06-10 DIAGNOSIS — R109 Unspecified abdominal pain: Secondary | ICD-10-CM

## 2012-06-12 ENCOUNTER — Telehealth: Payer: Self-pay | Admitting: Family Medicine

## 2012-06-12 DIAGNOSIS — M5030 Other cervical disc degeneration, unspecified cervical region: Secondary | ICD-10-CM | POA: Diagnosis not present

## 2012-06-12 DIAGNOSIS — M545 Low back pain, unspecified: Secondary | ICD-10-CM | POA: Diagnosis not present

## 2012-06-12 DIAGNOSIS — M9981 Other biomechanical lesions of cervical region: Secondary | ICD-10-CM | POA: Diagnosis not present

## 2012-06-12 DIAGNOSIS — M999 Biomechanical lesion, unspecified: Secondary | ICD-10-CM | POA: Diagnosis not present

## 2012-06-12 NOTE — Telephone Encounter (Signed)
Patient's husband called, she is still having abdominal swelling, some nausea, weakness  Has had some mucous in bowel movement Patient unhappy about not feeling better, concerned.  Also would like to know about ultrasound results  Please call

## 2012-06-12 NOTE — Telephone Encounter (Signed)
Please advise pt of u/s results (I believe I sent them to you already).  Have her f/u with Dr. Elnoria Howard re: ongoing abdominal complaints, as I believe we discussed at last visit.  U/s was fine

## 2012-06-12 NOTE — Telephone Encounter (Signed)
Pt advised.

## 2012-06-13 DIAGNOSIS — Z1231 Encounter for screening mammogram for malignant neoplasm of breast: Secondary | ICD-10-CM | POA: Diagnosis not present

## 2012-06-13 DIAGNOSIS — M999 Biomechanical lesion, unspecified: Secondary | ICD-10-CM | POA: Diagnosis not present

## 2012-06-13 DIAGNOSIS — M9981 Other biomechanical lesions of cervical region: Secondary | ICD-10-CM | POA: Diagnosis not present

## 2012-06-13 DIAGNOSIS — M545 Low back pain, unspecified: Secondary | ICD-10-CM | POA: Diagnosis not present

## 2012-06-13 DIAGNOSIS — M5030 Other cervical disc degeneration, unspecified cervical region: Secondary | ICD-10-CM | POA: Diagnosis not present

## 2012-06-14 DIAGNOSIS — IMO0001 Reserved for inherently not codable concepts without codable children: Secondary | ICD-10-CM | POA: Diagnosis not present

## 2012-06-14 DIAGNOSIS — M549 Dorsalgia, unspecified: Secondary | ICD-10-CM | POA: Diagnosis not present

## 2012-06-14 DIAGNOSIS — R143 Flatulence: Secondary | ICD-10-CM | POA: Diagnosis not present

## 2012-06-14 DIAGNOSIS — R141 Gas pain: Secondary | ICD-10-CM | POA: Diagnosis not present

## 2012-06-14 DIAGNOSIS — M349 Systemic sclerosis, unspecified: Secondary | ICD-10-CM | POA: Diagnosis not present

## 2012-06-14 DIAGNOSIS — R142 Eructation: Secondary | ICD-10-CM | POA: Diagnosis not present

## 2012-06-17 ENCOUNTER — Ambulatory Visit: Payer: Medicare Other | Admitting: Family Medicine

## 2012-06-17 DIAGNOSIS — M5030 Other cervical disc degeneration, unspecified cervical region: Secondary | ICD-10-CM | POA: Diagnosis not present

## 2012-06-17 DIAGNOSIS — M999 Biomechanical lesion, unspecified: Secondary | ICD-10-CM | POA: Diagnosis not present

## 2012-06-17 DIAGNOSIS — R141 Gas pain: Secondary | ICD-10-CM | POA: Diagnosis not present

## 2012-06-17 DIAGNOSIS — M545 Low back pain, unspecified: Secondary | ICD-10-CM | POA: Diagnosis not present

## 2012-06-17 DIAGNOSIS — M9981 Other biomechanical lesions of cervical region: Secondary | ICD-10-CM | POA: Diagnosis not present

## 2012-06-17 DIAGNOSIS — R143 Flatulence: Secondary | ICD-10-CM | POA: Diagnosis not present

## 2012-06-17 DIAGNOSIS — R634 Abnormal weight loss: Secondary | ICD-10-CM | POA: Diagnosis not present

## 2012-06-19 DIAGNOSIS — M5030 Other cervical disc degeneration, unspecified cervical region: Secondary | ICD-10-CM | POA: Diagnosis not present

## 2012-06-19 DIAGNOSIS — M545 Low back pain, unspecified: Secondary | ICD-10-CM | POA: Diagnosis not present

## 2012-06-19 DIAGNOSIS — M9981 Other biomechanical lesions of cervical region: Secondary | ICD-10-CM | POA: Diagnosis not present

## 2012-06-19 DIAGNOSIS — M999 Biomechanical lesion, unspecified: Secondary | ICD-10-CM | POA: Diagnosis not present

## 2012-06-24 DIAGNOSIS — M545 Low back pain, unspecified: Secondary | ICD-10-CM | POA: Diagnosis not present

## 2012-06-24 DIAGNOSIS — M999 Biomechanical lesion, unspecified: Secondary | ICD-10-CM | POA: Diagnosis not present

## 2012-06-24 DIAGNOSIS — M9981 Other biomechanical lesions of cervical region: Secondary | ICD-10-CM | POA: Diagnosis not present

## 2012-06-24 DIAGNOSIS — M5030 Other cervical disc degeneration, unspecified cervical region: Secondary | ICD-10-CM | POA: Diagnosis not present

## 2012-06-25 ENCOUNTER — Other Ambulatory Visit: Payer: Self-pay | Admitting: Family Medicine

## 2012-06-25 NOTE — Telephone Encounter (Signed)
Okay for #30, no refills

## 2012-06-25 NOTE — Telephone Encounter (Signed)
IS THIS OK 

## 2012-06-25 NOTE — Telephone Encounter (Signed)
Called xanax in per knapp

## 2012-06-26 DIAGNOSIS — M545 Low back pain, unspecified: Secondary | ICD-10-CM | POA: Diagnosis not present

## 2012-06-26 DIAGNOSIS — M999 Biomechanical lesion, unspecified: Secondary | ICD-10-CM | POA: Diagnosis not present

## 2012-06-26 DIAGNOSIS — M9981 Other biomechanical lesions of cervical region: Secondary | ICD-10-CM | POA: Diagnosis not present

## 2012-06-26 DIAGNOSIS — M5030 Other cervical disc degeneration, unspecified cervical region: Secondary | ICD-10-CM | POA: Diagnosis not present

## 2012-06-27 DIAGNOSIS — K2970 Gastritis, unspecified, without bleeding: Secondary | ICD-10-CM | POA: Diagnosis not present

## 2012-06-27 DIAGNOSIS — K649 Unspecified hemorrhoids: Secondary | ICD-10-CM | POA: Diagnosis not present

## 2012-06-27 DIAGNOSIS — R634 Abnormal weight loss: Secondary | ICD-10-CM | POA: Diagnosis not present

## 2012-06-27 DIAGNOSIS — R109 Unspecified abdominal pain: Secondary | ICD-10-CM | POA: Diagnosis not present

## 2012-06-27 DIAGNOSIS — K2940 Chronic atrophic gastritis without bleeding: Secondary | ICD-10-CM | POA: Diagnosis not present

## 2012-06-27 DIAGNOSIS — K319 Disease of stomach and duodenum, unspecified: Secondary | ICD-10-CM | POA: Diagnosis not present

## 2012-06-27 DIAGNOSIS — R143 Flatulence: Secondary | ICD-10-CM | POA: Diagnosis not present

## 2012-06-27 DIAGNOSIS — R197 Diarrhea, unspecified: Secondary | ICD-10-CM | POA: Diagnosis not present

## 2012-06-27 DIAGNOSIS — R141 Gas pain: Secondary | ICD-10-CM | POA: Diagnosis not present

## 2012-06-27 DIAGNOSIS — A048 Other specified bacterial intestinal infections: Secondary | ICD-10-CM | POA: Diagnosis not present

## 2012-06-27 HISTORY — PX: UPPER GI ENDOSCOPY: SHX6162

## 2012-07-03 ENCOUNTER — Encounter: Payer: Self-pay | Admitting: Family Medicine

## 2012-07-04 ENCOUNTER — Encounter: Payer: Self-pay | Admitting: Internal Medicine

## 2012-07-04 ENCOUNTER — Encounter: Payer: Self-pay | Admitting: Family Medicine

## 2012-07-05 ENCOUNTER — Other Ambulatory Visit: Payer: Self-pay

## 2012-07-05 DIAGNOSIS — D485 Neoplasm of uncertain behavior of skin: Secondary | ICD-10-CM | POA: Diagnosis not present

## 2012-07-05 DIAGNOSIS — L820 Inflamed seborrheic keratosis: Secondary | ICD-10-CM | POA: Diagnosis not present

## 2012-07-05 DIAGNOSIS — L570 Actinic keratosis: Secondary | ICD-10-CM | POA: Diagnosis not present

## 2012-07-05 DIAGNOSIS — L851 Acquired keratosis [keratoderma] palmaris et plantaris: Secondary | ICD-10-CM | POA: Diagnosis not present

## 2012-07-24 DIAGNOSIS — I493 Ventricular premature depolarization: Secondary | ICD-10-CM

## 2012-07-24 HISTORY — DX: Ventricular premature depolarization: I49.3

## 2012-07-30 DIAGNOSIS — R634 Abnormal weight loss: Secondary | ICD-10-CM | POA: Diagnosis not present

## 2012-07-30 DIAGNOSIS — R6881 Early satiety: Secondary | ICD-10-CM | POA: Diagnosis not present

## 2012-07-30 DIAGNOSIS — R141 Gas pain: Secondary | ICD-10-CM | POA: Diagnosis not present

## 2012-07-31 ENCOUNTER — Other Ambulatory Visit: Payer: Self-pay | Admitting: Gastroenterology

## 2012-07-31 ENCOUNTER — Encounter: Payer: Self-pay | Admitting: Family Medicine

## 2012-07-31 DIAGNOSIS — R634 Abnormal weight loss: Secondary | ICD-10-CM

## 2012-07-31 DIAGNOSIS — R109 Unspecified abdominal pain: Secondary | ICD-10-CM

## 2012-08-01 ENCOUNTER — Ambulatory Visit (INDEPENDENT_AMBULATORY_CARE_PROVIDER_SITE_OTHER): Payer: Medicare Other | Admitting: Family Medicine

## 2012-08-01 ENCOUNTER — Encounter: Payer: Self-pay | Admitting: Family Medicine

## 2012-08-01 ENCOUNTER — Telehealth: Payer: Self-pay | Admitting: *Deleted

## 2012-08-01 VITALS — BP 130/78 | HR 48 | Temp 97.7°F | Ht 59.0 in | Wt 116.0 lb

## 2012-08-01 DIAGNOSIS — I499 Cardiac arrhythmia, unspecified: Secondary | ICD-10-CM

## 2012-08-01 DIAGNOSIS — Z23 Encounter for immunization: Secondary | ICD-10-CM | POA: Diagnosis not present

## 2012-08-01 DIAGNOSIS — R5381 Other malaise: Secondary | ICD-10-CM

## 2012-08-01 DIAGNOSIS — R001 Bradycardia, unspecified: Secondary | ICD-10-CM | POA: Insufficient documentation

## 2012-08-01 DIAGNOSIS — I498 Other specified cardiac arrhythmias: Secondary | ICD-10-CM

## 2012-08-01 DIAGNOSIS — A048 Other specified bacterial intestinal infections: Secondary | ICD-10-CM | POA: Insufficient documentation

## 2012-08-01 DIAGNOSIS — R5383 Other fatigue: Secondary | ICD-10-CM | POA: Insufficient documentation

## 2012-08-01 MED ORDER — INFLUENZA VIRUS VACC SPLIT PF IM SUSP: 0.5000 mL | INTRAMUSCULAR | Status: DC

## 2012-08-01 MED ORDER — INFLUENZA VIRUS VACC SPLIT PF IM SUSP: 0.5000 mL | Freq: Once | INTRAMUSCULAR | Status: DC

## 2012-08-01 NOTE — Telephone Encounter (Signed)
Dr.Nishan at [facility] had a cancellation tomorrow, 08/02/12 @ 10:00am. Spoke with patient and she is aware of this appointment. Just an FYI for you. So you don't need to enter the referral as it is done. I faxed the EKG to Dr.Nishan @ [phone].

## 2012-08-01 NOTE — Progress Notes (Signed)
Chief Complaint  Patient presents with  . Fatigue    and just not feeling well overall x 4 months, has worsened in the last 2 months. Been also having a very low pulse rate since Sept. Would like to also speak to you about her thyroid.   HPI: Patient presents, accompanied by her spouse, with complaints of ongoing fatigue.  She has been under the care of Dr. Elnoria Howard, and recently had endoscopy.  She was told she had H.Pylori, and was rx'd Pylera.  She apparently had an allergic reaction and had to stop it.  Her tongue swelled, and got a prickly feeling and tingling in her face. Found old medical notes she took--had similar reaction to taking separate flayl, TCN, bismuth, nexium in 2010.  She feels swollen in her stomach, and hurts in her back after eating.  Has gastric emptying study scheduled to evaluate for gastroparesis. Dr. Elnoria Howard doesn't think that most of her symptoms are related to the H.Pylori  Today she is complaining of feeling exhausted and weak.  Pulse has remained 44-45 since September.  She doesn't have chest pain or dizziness.  She sleeps only 5.5-6 hrs/night.  Some anxiety contributes to poor sleep. She is starting to feel frustrated and somewhat down, related to her poor quality of life due to the fatigue and abdominal pain.    Past Medical History  Diagnosis Date  . Depression     treated in the past for years;stopped in 2010 for a years  . Panic attack   . Claustrophobia   . Fibromyalgia   . IBS (irritable bowel syndrome)   . Hypercholesteremia     with high HDL  . Glaucoma, narrow-angle     s/p laser surgery  . Duodenal ulcer 1962    h/o  . Hyperthyroidism     h/o x 2 years  . Shingles 1999    h/o  . Hypothyroid 9/08  . GERD (gastroesophageal reflux disease)   . Trochanteric bursitis 12/2008    bilateral  . Superficial thrombophlebitis 03/2009    RLE  . Bell's palsy 1966  . Osteoporosis 10/11    Dr.Hawkes  . SLE (systemic lupus erythematosus)     with  arthralgia/myalgia, positive ANA centromere pattern, positive DNA, positive RNP, indeterminant anti-cardiolipin antibody-Dr.Hawkes.  . Carotid artery disease 2010    on vascular screening;unchanged 2013.(could not tolerate simvastatin, no other statins tried)  . Recurrent UTI     has cystocele-Dr.Grewal   Past Surgical History  Procedure Date  . Tonsillectomy 1946  . Vaginal hysterectomy 1971    and bladder repair.  Still has ovaries  . Cataract extraction, bilateral 1995, 1996  . Thyroidectomy, partial 09/2005    L nodule; Dr. Gerrit Friends  . Upper gi endoscopy 06/27/12  . Flexible sigmoidoscopy    History   Social History  . Marital Status: Married    Spouse Name: N/A    Number of Children: 2  . Years of Education: N/A   Occupational History  . retired (school system)    Social History Main Topics  . Smoking status: Never Smoker   . Smokeless tobacco: Never Used  . Alcohol Use: No  . Drug Use: No  . Sexually Active: Not on file   Other Topics Concern  . Not on file   Social History Narrative   Married.  Son lives in [place]; Daughter [name] lives in [place]; 2 grandchildren   Current Outpatient Prescriptions on File Prior to Visit  Medication Sig Dispense Refill  .  ALPRAZolam (XANAX) 0.5 MG tablet TAKE 1/2 TO 1 TABLET BY MOUTH AS NEEDED FOR ANXIETY  30 tablet  0  . aspirin 81 MG tablet Take 81 mg by mouth daily.      Marland Kitchen b complex vitamins capsule Take 1 capsule by mouth 3 (three) times daily.       . cholecalciferol (VITAMIN D) 1000 UNITS tablet Take 2,000 Units by mouth daily.       Marland Kitchen levothyroxine (SYNTHROID) 25 MCG tablet Take 25 mcg by mouth daily.      Marland Kitchen levothyroxine (SYNTHROID) 50 MCG tablet Take 50 mcg by mouth daily.      . vitamin E 400 UNIT capsule Take 400 Units by mouth daily.      . Acetaminophen (TYLENOL ARTHRITIS EXT RELIEF PO) Take 500 mg by mouth daily.      . ARTIFICIAL TEAR OP Apply 1-2 drops to eye.       No current facility-administered medications on  file prior to visit.    Allergies  Allergen Reactions  . Salmon (Fish Allergy) Hives and Shortness Of Breath  . Aspirin Other (See Comments)    Sever stomach pain due to ulcer scaring.  . Ciprofloxacin Diarrhea  . Codeine Nausea And Vomiting  . Darvocet (Propoxyphene-Acetaminophen) Nausea And Vomiting  . Demerol (Meperidine) Nausea And Vomiting  . Diphedryl (Diphenhydramine) Other (See Comments)    Increased pulse/small amount ok  . Doxycycline Hyclate Other (See Comments)    GI intolerance.  Marland Kitchen Epinephrine Other (See Comments)    Breathing problems  . Erythromycin Other (See Comments)    GI intolerance.  . Flexeril (Cyclobenzaprine) Other (See Comments)    Tingly/prickly sensation.  . Iodine   . Keflex (Cephalexin) Hives  . Latex Other (See Comments)    Gloves ok  . Penicillins   . Prednisone Other (See Comments)    Headache  . Pylera (Bis Subcit-Metronid-Tetracyc) Swelling    Tongue swelling.  . Shellfish Allergy   . Sulfa Antibiotics Other (See Comments)    Increased pulse, fainting, diarrhea  . Xylocaine (Lidocaine Hcl)   . Zoloft (Sertraline Hcl) Other (See Comments)    Migraine   . Advil (Ibuprofen) Rash    Motrin ok with a GI effect.   ROS:  Denies fevers.  Denies URI symptoms, cough, shortness of breath, chest pain.  +abdominal pain, back pain.  No weight changes, rashes, bleeding/bruising.  See HPI  PHYSICAL EXAM: BP 130/78  Pulse 48  Temp 97.7 F (36.5 C) (Oral)  Ht 4\' 11"  (1.499 m)  Wt 116 lb (52.617 kg)  BMI 23.43 kg/m2 Pleasant, somewhat fatigued appearing female, in no distress. Neck: no lymphadenopathy thyromegaly or mass Heart: frequent skipped beats/pauses (but overall rate normal, about 60, with frequent pauses).  No murmur Lungs: clear bilaterally Skin no rash Psych: normal mood, affect, hygiene and grooming Neuro: alert and oriented, normal gait, strength  EKG: NSR rate of 71 with frequent PVC's (every 2-3 beats).  Poor R-wave  progression  ASSESSMENT/PLAN:  1. Fatigue    2. Bradycardia  PR ELECTROCARDIOGRAM, COMPLETE, Ambulatory referral to Cardiology  3. Need for prophylactic vaccination and inoculation against influenza  influenza  inactive virus vaccine (FLUZONE/FLUARIX) injection 0.5 mL, DISCONTINUED: influenza  inactive virus vaccine (FLUZONE/FLUARIX) injection  4. Irregular heartbeat  Ambulatory referral to Cardiology  5. H. pylori infection      All recent available labs reviewed with patient and husband--no labs need to be repeated today.  Patient reports WBC was improved on  most recent CBC done by Dr. Sharman Crate pylori--reviewed risk for potential ulcer formation, and what signs/symptoms are.  We reviewed all the medication regimens to eradicate H.pylori, but without knowing which ingredient (ie TCN vs flagyl) she is allergic to, plus knowing her other allergies), hard to use any combination to treat.  Since Dr. Elnoria Howard feels like symptoms aren't related to infection, no need to rush to try and eradicate at this point.  Will continue with gastric emptying scan.  F/u with Dr. Elnoria Howard if symptoms change, worsen.  Fatigue--? Etiology.  Recent labs normal.  ? If related to bradycardia (my nurse states that there was more significant bradycardia seen on the EKG monitor just prior to it printing (wouldn't even let her print one, due to slow rate), just didn't catch on EKG.  Will refer to cardiology for further evaluation.  Anxiety/possibly mild depression.  Given her intolerance to many meds, and no severe symptoms, I recommended counseling rather than medications.  She was given Raynelle Fanning [facility]'s card. She believes she has seen her in the past (distant), and liked her.  Will call to check and see if she takes Medicare now  35 minute visit, more than 1/2 spent counseling.

## 2012-08-01 NOTE — Patient Instructions (Addendum)
We are referring you to cardiologist for slow/irregular heart rate. See if Berniece Andreas takes your insurance--she would be a great therapist to talk to about moods, anxiety.

## 2012-08-02 ENCOUNTER — Encounter: Payer: Self-pay | Admitting: Cardiovascular Disease

## 2012-08-02 ENCOUNTER — Ambulatory Visit (INDEPENDENT_AMBULATORY_CARE_PROVIDER_SITE_OTHER): Payer: Medicare Other | Admitting: Cardiovascular Disease

## 2012-08-02 VITALS — BP 131/69 | HR 84 | Ht 64.0 in | Wt 116.0 lb

## 2012-08-02 DIAGNOSIS — R5381 Other malaise: Secondary | ICD-10-CM

## 2012-08-02 DIAGNOSIS — I498 Other specified cardiac arrhythmias: Secondary | ICD-10-CM

## 2012-08-02 DIAGNOSIS — R001 Bradycardia, unspecified: Secondary | ICD-10-CM

## 2012-08-02 DIAGNOSIS — I4949 Other premature depolarization: Secondary | ICD-10-CM | POA: Diagnosis not present

## 2012-08-02 DIAGNOSIS — R5383 Other fatigue: Secondary | ICD-10-CM

## 2012-08-02 DIAGNOSIS — I493 Ventricular premature depolarization: Secondary | ICD-10-CM

## 2012-08-02 NOTE — Patient Instructions (Addendum)
**Note De-identified Nuchem Grattan Obfuscation** Your physician recommends that you continue on your current medications as directed. Please refer to the Current Medication list given to you today.  Your physician recommends that you schedule a follow-up appointment in: as needed  

## 2012-08-02 NOTE — Progress Notes (Signed)
Patient ID: Tracy Malone, female   DOB: 1932/05/14, 79 y.o.   MRN: [id number] 79 yo referred by [facility] for bradycardia and fatigue.  Reviewed office notes and no documentation of bradycardia.  Office not indicates pulse 48 but ECG from that office visit 71.. She does have PVC;s and this may throw off attempts to record.  Echo 10/30 normal and reviewed EF 60%.  She has fatigue which sounds more like depression.  She has no chest pain palpitations or dyspnea.  No CRF or electrolyte abnormalities Reviewed recent labs and TSH normal.  Fatigue worse after steroid shots in back.    ROS: Denies fever, malais, weight loss, blurry vision, decreased visual acuity, cough, sputum, SOB, hemoptysis, pleuritic pain, palpitaitons, heartburn, abdominal pain, melena, lower extremity edema, claudication, or rash.  All other systems reviewed and negative   General: Affect appropriate Healthy:  appears stated age HEENT: normal Neck supple with no adenopathy JVP normal no bruits no thyromegaly Lungs clear with no wheezing and good diaphragmatic motion Heart:  S1/S2 no murmur,rub, gallop or click PMI normal Abdomen: benighn, BS positve, no tenderness, no AAA no bruit.  No HSM or HJR Distal pulses intact with no bruits No edema Neuro non-focal Skin warm and dry No muscular weakness  Medications Current Outpatient Prescriptions  Medication Sig Dispense Refill  . Acetaminophen (TYLENOL ARTHRITIS EXT RELIEF PO) Take 500 mg by mouth daily.      Marland Kitchen ALPRAZolam (XANAX) 0.5 MG tablet TAKE 1/2 TO 1 TABLET BY MOUTH AS NEEDED FOR ANXIETY  30 tablet  0  . ARTIFICIAL TEAR OP Apply 1-2 drops to eye.      Marland Kitchen aspirin 81 MG tablet Take 81 mg by mouth daily.      Marland Kitchen b complex vitamins capsule Take 1 capsule by mouth 3 (three) times daily.       . cholecalciferol (VITAMIN D) 1000 UNITS tablet Take 2,000 Units by mouth daily.       Marland Kitchen levothyroxine (SYNTHROID) 25 MCG tablet Take 25 mcg by mouth as directed.       Marland Kitchen  levothyroxine (SYNTHROID) 50 MCG tablet Take 50 mcg by mouth as directed.       Marland Kitchen omeprazole (PRILOSEC) 20 MG capsule Take 20 mg by mouth daily.      . vitamin E 400 UNIT capsule Take 400 Units by mouth. 3 times weekly        Allergies Salmon; Aspirin; Ciprofloxacin; Codeine; Darvocet; Demerol; Diphedryl; Doxycycline hyclate; Epinephrine; Erythromycin; Flexeril; Iodine; Keflex; Latex; Penicillins; Prednisone; Pylera; Shellfish allergy; Sulfa antibiotics; Xylocaine; Zoloft; and Advil  Family History: Family History  Problem Relation Age of Onset  . Heart disease Mother   . Hypertension Mother   . Hypertension Sister   . Diabetes Maternal Grandfather   . Heart disease Brother   . Cancer Brother     lung    Social History: History   Social History  . Marital Status: Married    Spouse Name: N/A    Number of Children: 2  . Years of Education: N/A   Occupational History  . retired (school system)    Social History Main Topics  . Smoking status: Never Smoker   . Smokeless tobacco: Never Used  . Alcohol Use: No  . Drug Use: No  . Sexually Active: Not on file   Other Topics Concern  . Not on file   Social History Narrative   Married.  Son lives in [place]; Daughter [name] lives in [place]; [place]  grandchildren    Electrocardiogram:  08/01/12  SR rate 71 LAD PVC  Today SR 84 PVD LAD  Assessment and Plan

## 2012-08-02 NOTE — Assessment & Plan Note (Signed)
Likely related to depression Consider anti seratonergic

## 2012-08-02 NOTE — Assessment & Plan Note (Signed)
Benign asymptomatic with normal EF

## 2012-08-02 NOTE — Assessment & Plan Note (Signed)
No objective bradycardia.  Suspect PVC;s throwing people off.  No AV block and no symtpoms Fatigue is not related to heart ECG;s always document pulse in 70's and 80;s  Echo and exam are normal as are labs

## 2012-08-05 ENCOUNTER — Other Ambulatory Visit: Payer: Self-pay | Admitting: Family Medicine

## 2012-08-05 NOTE — Telephone Encounter (Signed)
Okay for #30, no refill 

## 2012-08-05 NOTE — Telephone Encounter (Signed)
Is this okay to refill? 

## 2012-08-06 DIAGNOSIS — Z0000 Encounter for general adult medical examination without abnormal findings: Secondary | ICD-10-CM | POA: Diagnosis not present

## 2012-08-06 DIAGNOSIS — Z1331 Encounter for screening for depression: Secondary | ICD-10-CM | POA: Diagnosis not present

## 2012-08-08 ENCOUNTER — Encounter (HOSPITAL_COMMUNITY)
Admission: RE | Admit: 2012-08-08 | Discharge: 2012-08-08 | Disposition: A | Discharge status: Home / Self Care | Payer: Medicare Other | Source: Ambulatory Visit | Attending: Gastroenterology | Admitting: Gastroenterology

## 2012-08-08 DIAGNOSIS — R109 Unspecified abdominal pain: Secondary | ICD-10-CM

## 2012-08-08 DIAGNOSIS — R634 Abnormal weight loss: Secondary | ICD-10-CM | POA: Diagnosis not present

## 2012-08-08 MED ORDER — TECHNETIUM TC 99M SULFUR COLLOID
2.0000 | Freq: Once | INTRAVENOUS | Status: AC | PRN
  Administered 2012-08-08: 2 via ORAL

## 2012-08-12 ENCOUNTER — Ambulatory Visit: Payer: Medicare Other | Admitting: Licensed Clinical Social Worker

## 2012-08-12 DIAGNOSIS — R634 Abnormal weight loss: Secondary | ICD-10-CM | POA: Diagnosis not present

## 2012-08-12 DIAGNOSIS — R141 Gas pain: Secondary | ICD-10-CM | POA: Diagnosis not present

## 2012-08-15 ENCOUNTER — Encounter: Payer: Self-pay | Admitting: Cardiovascular Disease

## 2012-08-19 DIAGNOSIS — M9981 Other biomechanical lesions of cervical region: Secondary | ICD-10-CM | POA: Diagnosis not present

## 2012-08-19 DIAGNOSIS — M546 Pain in thoracic spine: Secondary | ICD-10-CM | POA: Diagnosis not present

## 2012-08-19 DIAGNOSIS — M999 Biomechanical lesion, unspecified: Secondary | ICD-10-CM | POA: Diagnosis not present

## 2012-08-19 DIAGNOSIS — M5030 Other cervical disc degeneration, unspecified cervical region: Secondary | ICD-10-CM | POA: Diagnosis not present

## 2012-08-21 DIAGNOSIS — M5030 Other cervical disc degeneration, unspecified cervical region: Secondary | ICD-10-CM | POA: Diagnosis not present

## 2012-08-21 DIAGNOSIS — M999 Biomechanical lesion, unspecified: Secondary | ICD-10-CM | POA: Diagnosis not present

## 2012-08-21 DIAGNOSIS — M546 Pain in thoracic spine: Secondary | ICD-10-CM | POA: Diagnosis not present

## 2012-08-21 DIAGNOSIS — M9981 Other biomechanical lesions of cervical region: Secondary | ICD-10-CM | POA: Diagnosis not present

## 2012-08-26 DIAGNOSIS — M5030 Other cervical disc degeneration, unspecified cervical region: Secondary | ICD-10-CM | POA: Diagnosis not present

## 2012-08-26 DIAGNOSIS — M546 Pain in thoracic spine: Secondary | ICD-10-CM | POA: Diagnosis not present

## 2012-08-26 DIAGNOSIS — M9981 Other biomechanical lesions of cervical region: Secondary | ICD-10-CM | POA: Diagnosis not present

## 2012-08-26 DIAGNOSIS — M999 Biomechanical lesion, unspecified: Secondary | ICD-10-CM | POA: Diagnosis not present

## 2012-08-28 ENCOUNTER — Ambulatory Visit (INDEPENDENT_AMBULATORY_CARE_PROVIDER_SITE_OTHER): Payer: Medicare Other | Admitting: Licensed Clinical Social Worker

## 2012-08-28 DIAGNOSIS — M5030 Other cervical disc degeneration, unspecified cervical region: Secondary | ICD-10-CM | POA: Diagnosis not present

## 2012-08-28 DIAGNOSIS — M9981 Other biomechanical lesions of cervical region: Secondary | ICD-10-CM | POA: Diagnosis not present

## 2012-08-28 DIAGNOSIS — M999 Biomechanical lesion, unspecified: Secondary | ICD-10-CM | POA: Diagnosis not present

## 2012-08-28 DIAGNOSIS — F411 Generalized anxiety disorder: Secondary | ICD-10-CM

## 2012-08-28 DIAGNOSIS — M546 Pain in thoracic spine: Secondary | ICD-10-CM | POA: Diagnosis not present

## 2012-08-29 DIAGNOSIS — M5030 Other cervical disc degeneration, unspecified cervical region: Secondary | ICD-10-CM | POA: Diagnosis not present

## 2012-08-29 DIAGNOSIS — M546 Pain in thoracic spine: Secondary | ICD-10-CM | POA: Diagnosis not present

## 2012-08-29 DIAGNOSIS — M9981 Other biomechanical lesions of cervical region: Secondary | ICD-10-CM | POA: Diagnosis not present

## 2012-08-29 DIAGNOSIS — M999 Biomechanical lesion, unspecified: Secondary | ICD-10-CM | POA: Diagnosis not present

## 2012-09-04 DIAGNOSIS — M546 Pain in thoracic spine: Secondary | ICD-10-CM | POA: Diagnosis not present

## 2012-09-04 DIAGNOSIS — M999 Biomechanical lesion, unspecified: Secondary | ICD-10-CM | POA: Diagnosis not present

## 2012-09-04 DIAGNOSIS — M5030 Other cervical disc degeneration, unspecified cervical region: Secondary | ICD-10-CM | POA: Diagnosis not present

## 2012-09-04 DIAGNOSIS — M9981 Other biomechanical lesions of cervical region: Secondary | ICD-10-CM | POA: Diagnosis not present

## 2012-09-11 ENCOUNTER — Ambulatory Visit: Payer: Medicare Other | Admitting: Licensed Clinical Social Worker

## 2012-09-16 ENCOUNTER — Ambulatory Visit (INDEPENDENT_AMBULATORY_CARE_PROVIDER_SITE_OTHER): Payer: Medicare Other | Admitting: Licensed Clinical Social Worker

## 2012-09-16 DIAGNOSIS — M546 Pain in thoracic spine: Secondary | ICD-10-CM | POA: Diagnosis not present

## 2012-09-16 DIAGNOSIS — M9981 Other biomechanical lesions of cervical region: Secondary | ICD-10-CM | POA: Diagnosis not present

## 2012-09-16 DIAGNOSIS — F411 Generalized anxiety disorder: Secondary | ICD-10-CM

## 2012-09-16 DIAGNOSIS — M999 Biomechanical lesion, unspecified: Secondary | ICD-10-CM | POA: Diagnosis not present

## 2012-09-16 DIAGNOSIS — M5030 Other cervical disc degeneration, unspecified cervical region: Secondary | ICD-10-CM | POA: Diagnosis not present

## 2012-09-18 ENCOUNTER — Ambulatory Visit (INDEPENDENT_AMBULATORY_CARE_PROVIDER_SITE_OTHER): Payer: Medicare Other | Admitting: Family Medicine

## 2012-09-18 ENCOUNTER — Ambulatory Visit: Payer: Medicare Other | Admitting: Licensed Clinical Social Worker

## 2012-09-18 ENCOUNTER — Encounter: Payer: Self-pay | Admitting: Family Medicine

## 2012-09-18 VITALS — BP 108/64 | HR 56 | Ht 59.0 in | Wt 118.0 lb

## 2012-09-18 DIAGNOSIS — F411 Generalized anxiety disorder: Secondary | ICD-10-CM | POA: Diagnosis not present

## 2012-09-18 DIAGNOSIS — I4949 Other premature depolarization: Secondary | ICD-10-CM | POA: Diagnosis not present

## 2012-09-18 DIAGNOSIS — R5383 Other fatigue: Secondary | ICD-10-CM

## 2012-09-18 DIAGNOSIS — F329 Major depressive disorder, single episode, unspecified: Secondary | ICD-10-CM | POA: Diagnosis not present

## 2012-09-18 DIAGNOSIS — I493 Ventricular premature depolarization: Secondary | ICD-10-CM

## 2012-09-18 DIAGNOSIS — E039 Hypothyroidism, unspecified: Secondary | ICD-10-CM | POA: Diagnosis not present

## 2012-09-18 DIAGNOSIS — R5381 Other malaise: Secondary | ICD-10-CM

## 2012-09-18 DIAGNOSIS — F3289 Other specified depressive episodes: Secondary | ICD-10-CM | POA: Diagnosis not present

## 2012-09-18 NOTE — Patient Instructions (Addendum)
We will be in touch with your thyroid test results tomorrow.  If your thyroid tests are normal, we will plan on starting sertraline 25mg  at bedtime (generic zoloft).  If the thyroid needs to be adjusted, then we will hold off on starting the anti-depressant until thyroid tests are normal, as moods can be affected by thyroid being off.  Thank you for the beautiful scarf!!!

## 2012-09-18 NOTE — Progress Notes (Signed)
Chief Complaint  Patient presents with  . Advice Only    wants to discuss her meds and talk about blood sugar and thyroid. Hair is dry and falling out, legs and hands feel cold, and skin is flaky. Feels shaky every afternoon around 1pm, feels like she needs to eat, eats and doesn't help.   She is complaining of dry, flaky skin, dry hair and hair loss, cold hands and feet.  She is also complaining of diarrhea off and on (every 3-4 days) without any particular change in her diet.   Her stomach pain has finally resolved, ultimately thinking it was from the steroid injections she received. Nervous, irritable Between 1-2pm her hands shake.  Doesn't drink caffeine.  Eats breakfast 9:30-10, eating lunch around 1:30.  Notices the shakiness prior to eating lunch, but it doesn't get better by eating.  She sometimes needs to take 1/2 alprazolam and lie down, between 1-3x/week. 3:30 am awoke from sleep on Sunday, heart was racing, felt shaky.  Had chamomile tea, cookie and 1/2 alprazolam, went back to sleep at 5:30, and was better when she woke up later.  Fasting sugars 97-99, when felt shaky was 122 (using her granddaughter's meter).  Seeing Berniece Andreas, likes her a lot.  Patient reports that she thinks Raynelle Fanning thinks she might be "clinically depressed".  She was put on Zoloft years ago, which worked well, but then was later diagnosed with her thyroid problem.  Feels draggy, doesn't have the desire to do anything, doesn't enjoy things.  Doesn't want to get out of bed.  She forces herself to get out of bed and do things. Sometimes feels her heart beating fast, other times "hardly beating".  Past Medical History  Diagnosis Date  . Depression     treated in the past for years;stopped in 2010 for a years  . Panic attack   . Claustrophobia   . Fibromyalgia   . IBS (irritable bowel syndrome)   . Hypercholesteremia     with high HDL  . Glaucoma, narrow-angle     s/p laser surgery  . Duodenal ulcer 1962   h/o  . Hyperthyroidism     h/o x 2 years  . Shingles 1999    h/o  . Hypothyroid 9/08  . GERD (gastroesophageal reflux disease)   . Trochanteric bursitis 12/2008    bilateral  . Superficial thrombophlebitis 03/2009    RLE  . Bell's palsy 1966  . Osteoporosis 10/11    Dr.Hawkes  . SLE (systemic lupus erythematosus)     with arthralgia/myalgia, positive ANA centromere pattern, positive DNA, positive RNP, indeterminant anti-cardiolipin antibody-Dr.Hawkes.  . Carotid artery disease 2010    on vascular screening;unchanged 2013.(could not tolerate simvastatin, no other statins tried)  . Recurrent UTI     has cystocele-Dr.Grewal   Past Surgical History  Procedure Laterality Date  . Tonsillectomy  1946  . Vaginal hysterectomy  1971    and bladder repair.  Still has ovaries  . Cataract extraction, bilateral  1995, 1996  . Thyroidectomy, partial  09/2005    L nodule; Dr. Gerrit Friends  . Upper gi endoscopy  06/27/12  . Flexible sigmoidoscopy     History   Social History  . Marital Status: Married    Spouse Name: N/A    Number of Children: 2  . Years of Education: N/A   Occupational History  . retired (school system)    Social History Main Topics  . Smoking status: Never Smoker   . Smokeless tobacco:  Never Used  . Alcohol Use: No  . Drug Use: No  . Sexually Active: Not on file   Other Topics Concern  . Not on file   Social History Narrative   Married.  Son lives in [place]; Daughter [name] lives in [place]; 2 grandchildren   Current Outpatient Prescriptions on File Prior to Visit  Medication Sig Dispense Refill  . ALPRAZolam (XANAX) 0.5 MG tablet TAKE 1/2 TO 1 TABLET BY MOUTH AS NEEDED FOR ANXIETY  30 tablet  0  . aspirin 81 MG tablet Take 81 mg by mouth daily.      Marland Kitchen b complex vitamins capsule Take 1 capsule by mouth 3 (three) times daily.       . cholecalciferol (VITAMIN D) 1000 UNITS tablet Take 2,000 Units by mouth daily.       Marland Kitchen levothyroxine (SYNTHROID) 25 MCG tablet Take 25  mcg by mouth as directed.       Marland Kitchen levothyroxine (SYNTHROID) 50 MCG tablet Take 50 mcg by mouth as directed.       . vitamin E 400 UNIT capsule Take 400 Units by mouth. 3 times weekly      . Acetaminophen (TYLENOL ARTHRITIS EXT RELIEF PO) Take 500 mg by mouth daily.      . ARTIFICIAL TEAR OP Apply 1-2 drops to eye.       No current facility-administered medications on file prior to visit.   Allergies  Allergen Reactions  . Salmon (Fish Allergy) Hives and Shortness Of Breath  . Aspirin Other (See Comments)    Sever stomach pain due to ulcer scaring.  . Ciprofloxacin Diarrhea  . Codeine Nausea And Vomiting  . Darvocet (Propoxyphene-Acetaminophen) Nausea And Vomiting  . Demerol (Meperidine) Nausea And Vomiting  . Diphedryl (Diphenhydramine) Other (See Comments)    Increased pulse/small amount ok  . Doxycycline Hyclate Other (See Comments)    GI intolerance.  Marland Kitchen Epinephrine Other (See Comments)    Breathing problems  . Erythromycin Other (See Comments)    GI intolerance.  . Flexeril (Cyclobenzaprine) Other (See Comments)    Tingly/prickly sensation.  . Iodine   . Keflex (Cephalexin) Hives  . Latex Other (See Comments)    Gloves ok  . Penicillins   . Prednisone Other (See Comments)    Headache  . Pylera (Bis Subcit-Metronid-Tetracyc) Swelling    Tongue swelling.  . Shellfish Allergy   . Sulfa Antibiotics Other (See Comments)    Increased pulse, fainting, diarrhea  . Xylocaine (Lidocaine Hcl)   . Zoloft (Sertraline Hcl) Other (See Comments)    Migraine   . Advil (Ibuprofen) Rash    Motrin ok with a GI effect.   ROS:  Denies fevers, URI symptoms, cough, chest pain, shortness of breath.  Denies vomiting or diarrhea, bloody stools.  Denies urinary complaints.  Reports feeling depressed and anxious.  See HPI.  PHYSICAL EXAM: BP 108/64  Pulse 56  Ht 4\' 11"  (1.499 m)  Wt 118 lb (53.524 kg)  BMI 23.82 kg/m2 Well developed pleasant thin female in no distress HEENT:  Conjunctiva  clear. OP clear Neck: no lymphadenopathy thyromegaly or mass Heart: regular rate, Frequent ectopy, every 2nd-6th beat, irregular. Extremities: no edmea  ASSESSMENT/PLAN:  Unspecified hypothyroidism - Plan: TSH, T4, Free, T3  Anxiety state, unspecified  Depressive disorder, not elsewhere classified  PVC (premature ventricular contraction)  Fatigue  She wants additional thyroid tests done.  Check TSH, free t4, t3.  Adjust meds as indicated by results.  If thyroid tests normal,  then plan to restart zoloft 25mg  qHS, which she previously tolerated. Recalls some ocular migraines in the past, which she thought might have been related to the zoloft (she had been on it for about a year), but she would like to retry this. Nausea from citalopram in the past, didn't tolerate it.  Frequent ectopy: Has seen cardiologist and is not worried about fatigue being from her heart  Marker of depression--ask if knitting

## 2012-09-19 ENCOUNTER — Other Ambulatory Visit: Payer: Self-pay | Admitting: *Deleted

## 2012-09-19 DIAGNOSIS — M999 Biomechanical lesion, unspecified: Secondary | ICD-10-CM | POA: Diagnosis not present

## 2012-09-19 DIAGNOSIS — M9981 Other biomechanical lesions of cervical region: Secondary | ICD-10-CM | POA: Diagnosis not present

## 2012-09-19 DIAGNOSIS — M5030 Other cervical disc degeneration, unspecified cervical region: Secondary | ICD-10-CM | POA: Diagnosis not present

## 2012-09-19 DIAGNOSIS — M546 Pain in thoracic spine: Secondary | ICD-10-CM | POA: Diagnosis not present

## 2012-09-19 DIAGNOSIS — F411 Generalized anxiety disorder: Secondary | ICD-10-CM

## 2012-09-19 LAB — TSH: TSH: 1.371 u[IU]/mL (ref 0.350–4.500)

## 2012-09-19 LAB — T4, FREE: Free T4: 1.19 ng/dL (ref 0.80–1.80)

## 2012-09-19 LAB — T3: T3, Total: 95.1 ng/dL (ref 80.0–204.0)

## 2012-09-19 MED ORDER — SERTRALINE HCL 25 MG PO TABS: 25.0000 mg | ORAL_TABLET | Freq: Every day | ORAL | Status: DC

## 2012-09-20 ENCOUNTER — Encounter: Payer: Self-pay | Admitting: Family Medicine

## 2012-09-25 DIAGNOSIS — I789 Disease of capillaries, unspecified: Secondary | ICD-10-CM | POA: Diagnosis not present

## 2012-09-25 DIAGNOSIS — L821 Other seborrheic keratosis: Secondary | ICD-10-CM | POA: Diagnosis not present

## 2012-09-25 DIAGNOSIS — L570 Actinic keratosis: Secondary | ICD-10-CM | POA: Diagnosis not present

## 2012-09-25 DIAGNOSIS — D235 Other benign neoplasm of skin of trunk: Secondary | ICD-10-CM | POA: Diagnosis not present

## 2012-09-25 DIAGNOSIS — L650 Telogen effluvium: Secondary | ICD-10-CM | POA: Diagnosis not present

## 2012-09-26 ENCOUNTER — Telehealth: Payer: Self-pay | Admitting: *Deleted

## 2012-09-26 NOTE — Telephone Encounter (Signed)
Patient called and states that she has been cutting her zoloft in 1/2 and taking her 25mg  for the last 3-4 nights. Last night she took it @ 9:30pm and around 11:00pm the inside of her upper and lower lips starting swelling. Wonders if this could be a reaction to the zoloft? She wanted me to mention to you that she would be willing to try Lexapro if needed. Thanks.

## 2012-09-26 NOTE — Telephone Encounter (Signed)
She has taken this in the past.  If this only occurred, once, possibly could have been related to something she ate, or some other cause.  If swelling was signficant (spread to tongue, affected breathing, etc) then I wouldn't recommend continuing if no other cause if found. Otherwise, I recommend she continue to take it---she can take a benadryl along with it at bedtime if worried, and if no problems, she can stop the benadryl and see how she does

## 2012-09-26 NOTE — Telephone Encounter (Signed)
Patient informed of Dr.Knapp's recommendations and she will call back if she has only problems.

## 2012-09-30 ENCOUNTER — Telehealth: Payer: Self-pay | Admitting: Family Medicine

## 2012-09-30 ENCOUNTER — Other Ambulatory Visit: Payer: Self-pay | Admitting: Family Medicine

## 2012-09-30 NOTE — Telephone Encounter (Signed)
Patient advised of Dr.Knapp's recommendations, she will have Raynelle Fanning call if she feels that she should be started on something.

## 2012-09-30 NOTE — Telephone Encounter (Signed)
PT HAD REACTION TO MEDICATION OVER THE WEEKEND AND WAS INFORMED BY PHARMACY TO STOP TAKING IT. HER TONGUE STARTED SWELLING. PLEASE CALL PT AND INFORM OF WHAT WE NEED TO DO NOW. PT USES [facility] ON [address].

## 2012-09-30 NOTE — Telephone Encounter (Signed)
Is this okay to refill? 

## 2012-09-30 NOTE — Telephone Encounter (Signed)
Okay for #30, no refill 

## 2012-09-30 NOTE — Telephone Encounter (Signed)
I am fine with her not being started on a medication right away, and to continue therapy with Raynelle Fanning.  We can consider starting Wellbutrin in future, but I don't think we need to rush to start a new medication, maybe in a few weeks to a month if not getting improvement with therapy.  Raynelle Fanning can call me to discuss if she feels the patient shouldn't wait/should be started on med sooner (advise pt to tell Raynelle Fanning this)

## 2012-10-02 ENCOUNTER — Ambulatory Visit (INDEPENDENT_AMBULATORY_CARE_PROVIDER_SITE_OTHER): Payer: Medicare Other | Admitting: Family Medicine

## 2012-10-02 ENCOUNTER — Encounter: Payer: Self-pay | Admitting: Family Medicine

## 2012-10-02 VITALS — BP 120/64 | HR 52 | Ht 59.0 in | Wt 118.0 lb

## 2012-10-02 DIAGNOSIS — F329 Major depressive disorder, single episode, unspecified: Secondary | ICD-10-CM | POA: Insufficient documentation

## 2012-10-02 DIAGNOSIS — F3289 Other specified depressive episodes: Secondary | ICD-10-CM | POA: Insufficient documentation

## 2012-10-02 DIAGNOSIS — I4949 Other premature depolarization: Secondary | ICD-10-CM | POA: Diagnosis not present

## 2012-10-02 DIAGNOSIS — I493 Ventricular premature depolarization: Secondary | ICD-10-CM | POA: Insufficient documentation

## 2012-10-02 MED ORDER — BUPROPION HCL ER (XL) 150 MG PO TB24: 150.0000 mg | ORAL_TABLET | Freq: Every day | ORAL | Status: DC

## 2012-10-02 NOTE — Progress Notes (Signed)
Chief Complaint  Patient presents with  . Tachycardia    heart started racing last Friday, does state that she has felt some pulsations in her left arm occasionally.   She is waking up at night, around 2-3 am, about 3x in the last week, with her heart "pulsating", "doesn't feel right".  Denies tachycardia.  One night it woke her from sleep, the other times she has just woken up and noticed it.  She took a xanax once when this occurred, didn't seem to work very fast (took 1/2 tablet), took over an hour before she felt better, and went back to sleep.  Crying, stressed, hasn't felt herself since steroid shots in September.  Had lip swelling after 3rd day of zoloft, then tongue swelling developed.  Swelling has resolved with use of benadryl and stopping the zoloft.  She tolerated BRAND of zoloft in the past.  She would like to try another medication for depression, and states that Berniece Andreas (therapist) agreed that medication would benefit her.  She reports that by the 5th day of zoloft use, she actually noted some improvement in her mood.  Known PVC's, saw cardiologist in January--see epic notes  Past Medical History  Diagnosis Date  . Depression     treated in the past for years;stopped in 2010 for a years  . Panic attack   . Claustrophobia   . Fibromyalgia   . IBS (irritable bowel syndrome)   . Hypercholesteremia     with high HDL  . Glaucoma, narrow-angle     s/p laser surgery  . Duodenal ulcer 1962    h/o  . Hyperthyroidism     h/o x 2 years  . Shingles 1999    h/o  . Hypothyroid 9/08  . GERD (gastroesophageal reflux disease)   . Trochanteric bursitis 12/2008    bilateral  . Superficial thrombophlebitis 03/2009    RLE  . Bell's palsy 1966  . Osteoporosis 10/11    Dr.Hawkes  . SLE (systemic lupus erythematosus)     with arthralgia/myalgia, positive ANA centromere pattern, positive DNA, positive RNP, indeterminant anti-cardiolipin antibody-Dr.Hawkes.  . Carotid artery disease  2010    on vascular screening;unchanged 2013.(could not tolerate simvastatin, no other statins tried)  . Recurrent UTI     has cystocele-Dr.Grewal   Past Surgical History  Procedure Laterality Date  . Tonsillectomy  1946  . Vaginal hysterectomy  1971    and bladder repair.  Still has ovaries  . Cataract extraction, bilateral  1995, 1996  . Thyroidectomy, partial  09/2005    L nodule; Dr. Gerrit Friends  . Upper gi endoscopy  06/27/12  . Flexible sigmoidoscopy     History   Social History  . Marital Status: Married    Spouse Name: N/A    Number of Children: 2  . Years of Education: N/A   Occupational History  . retired (school system)    Social History Main Topics  . Smoking status: Never Smoker   . Smokeless tobacco: Never Used  . Alcohol Use: No  . Drug Use: No  . Sexually Active: Not on file   Other Topics Concern  . Not on file   Social History Narrative   Married.  Son lives in [place]; Daughter [name] lives in [place]; 2 grandchildren   Current Outpatient Prescriptions on File Prior to Visit  Medication Sig Dispense Refill  . ALPRAZolam (XANAX) 0.5 MG tablet TAKE 1/2 TO 1 TABLET BY MOUTH AS NEEDED FOR ANXIETY  30 tablet  0  . aspirin 81 MG tablet Take 81 mg by mouth daily.      Marland Kitchen levothyroxine (SYNTHROID) 25 MCG tablet Take 25 mcg by mouth as directed.       Marland Kitchen levothyroxine (SYNTHROID) 50 MCG tablet Take 50 mcg by mouth as directed.       . Acetaminophen (TYLENOL ARTHRITIS EXT RELIEF PO) Take 500 mg by mouth daily.      . ARTIFICIAL TEAR OP Apply 1-2 drops to eye.      Marland Kitchen b complex vitamins capsule Take 1 capsule by mouth 3 (three) times daily.       . cholecalciferol (VITAMIN D) 1000 UNITS tablet Take 2,000 Units by mouth daily.       . vitamin E 400 UNIT capsule Take 400 Units by mouth. 3 times weekly       No current facility-administered medications on file prior to visit.   Allergies  Allergen Reactions  . Salmon (Fish Allergy) Hives and Shortness Of Breath  .  Aspirin Other (See Comments)    Sever stomach pain due to ulcer scaring.  . Ciprofloxacin Diarrhea  . Codeine Nausea And Vomiting  . Darvocet (Propoxyphene-Acetaminophen) Nausea And Vomiting  . Demerol (Meperidine) Nausea And Vomiting  . Diphedryl (Diphenhydramine) Other (See Comments)    Increased pulse/small amount ok  . Doxycycline Hyclate Other (See Comments)    GI intolerance.  Marland Kitchen Epinephrine Other (See Comments)    Breathing problems  . Erythromycin Other (See Comments)    GI intolerance.  . Flexeril (Cyclobenzaprine) Other (See Comments)    Tingly/prickly sensation.  . Iodine   . Keflex (Cephalexin) Hives  . Latex Other (See Comments)    Gloves ok  . Penicillins   . Prednisone Other (See Comments)    Headache  . Pylera (Bis Subcit-Metronid-Tetracyc) Swelling    Tongue swelling.  . Shellfish Allergy   . Sulfa Antibiotics Other (See Comments)    Increased pulse, fainting, diarrhea  . Xylocaine (Lidocaine Hcl)   . Zoloft (Sertraline Hcl) Swelling and Other (See Comments)    Migraine Swelling of tongue/lip (09/2012)  . Advil (Ibuprofen) Rash    Motrin ok with a GI effect.   ROS:  Denies fevers, URI symptoms, cough, chest pain. +palpitations, no tachycardia. No shortness of breath.  +fatigue.  No headaches, dizziness, vomiting, diarrhea, skin rash, bleeding/bruising or other concerns  PHYSICAL EXAM: BP 120/64  Pulse 52  Ht 4\' 11"  (1.499 m)  Wt 118 lb (53.524 kg)  BMI 23.82 kg/m2  Psych: Crying at first, but full range of affect. Normal hygiene and grooming, good eye contact, normal speech.  +depressed mood. Neck: no lymphadenopathy, thyromegaly or mass Heart: occasional ectopic beat (about 3-4/minute, with long stretches of normal rhythm) Lungs: clear bilaterally with good air movement Abdomen: nontender Extremities: no edema Skin: no rash  ASSESSMENT/PLAN: Depressive disorder, not elsewhere classified - Plan: buPROPion (WELLBUTRIN XL) 150 MG 24 hr tablet  PVC's  (premature ventricular contractions)  Symptomatic PVC's, no tachycardia.  Consider Holter monitor if worsening in symptoms/frequency/severity.  Currently only noted in the evenings. Likely related to room being quite, and +anxiety.  Treatment not recommended at this point.  Avoiding beta blockers due to minimal symptoms (just nocturnal) and depression uncontrolled.  Consider treatment in future, possibly refer back to cardiologist if persistent/worsening.  Depression:  Continue therapy with Raynelle Fanning as scheduled.  Start Wellbutrin XL 150mg  daily.  Risks/side effects reviewed and patient verbalizes understanding of plan.  F/u as scheduled next month  for f/u depression, sooner prn

## 2012-10-02 NOTE — Patient Instructions (Signed)
Start the wellbutrin once daily in the mornings (these cannot be cut).  Premature Ventricular Contraction Premature ventricular contraction (PVC) is an irregularity of the heart rhythm involving extra or skipped heartbeats. In some cases, they may occur without obvious cause or heart disease. Other times, they can be caused by an electrolyte change in the blood. These need to be corrected. They can also be seen when there is not enough oxygen going to the heart. A common cause of this is plaque or cholesterol buildup. This buildup decreases the blood supply to the heart. In addition, extra beats may be caused or aggravated by:  Excessive smoking.  Alcohol consumption.  Caffeine.  Certain medications  Some street drugs. SYMPTOMS   The sensation of feeling your heart skipping a beat (palpitations).  In many cases, the person may have no symptoms. SIGNS AND TESTS   A physical examination may show an occasional irregularity, but if the PVC beats do not happen often, they may not be found on physical exam.  Blood pressure is usually normal.  Other tests that may find extra beats of the heart are:  An EKG (electrocardiogram)  A Holter monitor which can monitor your heart over longer periods of time  An Angiogram (study of the heart arteries). TREATMENT  Usually extra heartbeats do not need treatment. The condition is treated only if symptoms are severe or if extra beats are very frequent or are causing problems. An underlying cause, if discovered, may also require treatment.  Treatment may also be needed if there may be a risk for other more serious cardiac arrhythmias.  PREVENTION   Moderation in caffeine, alcohol, and tobacco use may reduce the risk of ectopic heartbeats in some people.  Exercise often helps people who lead a sedentary (inactive) lifestyle. PROGNOSIS  PVC heartbeats are generally harmless and do not need treatment.  RISKS AND COMPLICATIONS   Ventricular  tachycardia (occasionally).  There usually are no complications.  Other arrhythmias (occasionally). SEEK IMMEDIATE MEDICAL CARE IF:   You feel palpitations that are frequent or continual.  You develop chest pain or other problems such as shortness of breath, sweating, or nausea and vomiting.  You become light-headed or faint (pass out).  You get worse or do not improve with treatment. Document Released: 02/25/2004 Document Revised: 10/02/2011 Document Reviewed: 09/06/2007 [facility] Patient Information 2013 [place], [place].

## 2012-10-04 ENCOUNTER — Ambulatory Visit (INDEPENDENT_AMBULATORY_CARE_PROVIDER_SITE_OTHER): Payer: Medicare Other | Admitting: Licensed Clinical Social Worker

## 2012-10-04 DIAGNOSIS — F411 Generalized anxiety disorder: Secondary | ICD-10-CM

## 2012-10-08 ENCOUNTER — Encounter: Payer: Self-pay | Admitting: Family Medicine

## 2012-10-16 ENCOUNTER — Ambulatory Visit (INDEPENDENT_AMBULATORY_CARE_PROVIDER_SITE_OTHER): Payer: Medicare Other | Admitting: Licensed Clinical Social Worker

## 2012-10-16 DIAGNOSIS — F411 Generalized anxiety disorder: Secondary | ICD-10-CM | POA: Diagnosis not present

## 2012-10-30 ENCOUNTER — Ambulatory Visit (INDEPENDENT_AMBULATORY_CARE_PROVIDER_SITE_OTHER): Payer: Medicare Other | Admitting: Licensed Clinical Social Worker

## 2012-10-30 ENCOUNTER — Ambulatory Visit: Payer: Medicare Other | Admitting: Family Medicine

## 2012-10-30 DIAGNOSIS — F411 Generalized anxiety disorder: Secondary | ICD-10-CM | POA: Diagnosis not present

## 2012-10-31 ENCOUNTER — Telehealth: Payer: Self-pay | Admitting: *Deleted

## 2012-10-31 NOTE — Telephone Encounter (Signed)
Left message for patient that vitamin was okay per Dr.Knapp.

## 2012-10-31 NOTE — Telephone Encounter (Signed)
Patient rescheduled appointment for 11/13/12. She wants to talk to you about her heart rate issues as well as ongoing fatigue. She wanted me to ask you if it would be okay for her to start taking an OTC multivitamin w/o iron and w/o iodine. Would you be okay with this? Please advise. Thanks.

## 2012-10-31 NOTE — Telephone Encounter (Signed)
Okay for vitamin.

## 2012-11-03 ENCOUNTER — Encounter: Payer: Self-pay | Admitting: Family Medicine

## 2012-11-13 ENCOUNTER — Encounter: Payer: Self-pay | Admitting: Family Medicine

## 2012-11-13 ENCOUNTER — Ambulatory Visit (INDEPENDENT_AMBULATORY_CARE_PROVIDER_SITE_OTHER): Payer: Medicare Other | Admitting: Family Medicine

## 2012-11-13 VITALS — BP 110/60 | HR 64 | Ht 59.0 in | Wt 117.0 lb

## 2012-11-13 DIAGNOSIS — F329 Major depressive disorder, single episode, unspecified: Secondary | ICD-10-CM

## 2012-11-13 DIAGNOSIS — F411 Generalized anxiety disorder: Secondary | ICD-10-CM

## 2012-11-13 DIAGNOSIS — D1720 Benign lipomatous neoplasm of skin and subcutaneous tissue of unspecified limb: Secondary | ICD-10-CM

## 2012-11-13 DIAGNOSIS — I493 Ventricular premature depolarization: Secondary | ICD-10-CM

## 2012-11-13 DIAGNOSIS — I4949 Other premature depolarization: Secondary | ICD-10-CM | POA: Diagnosis not present

## 2012-11-13 DIAGNOSIS — F3289 Other specified depressive episodes: Secondary | ICD-10-CM | POA: Diagnosis not present

## 2012-11-13 DIAGNOSIS — D1779 Benign lipomatous neoplasm of other sites: Secondary | ICD-10-CM | POA: Diagnosis not present

## 2012-11-13 MED ORDER — FLUOXETINE HCL 10 MG PO TABS: ORAL_TABLET | ORAL | Status: DC

## 2012-11-13 NOTE — Progress Notes (Signed)
Chief Complaint  Patient presents with  . Advice Only    wants to discuss heart issues and fatigue. Also has some places on her right arm that she would like you to look at.   Patient presents for follow up on depression.  She is wanting to try 10mg  of Prozac.  She reports that she previously took Prozac 20mg , and it made her jittery.  Had taken 10mg  also, and did better with that.  Zoloft caused lip and tongue swelling (with the generic; tolerated brand in the distant past), and she didn't tolerate Wellbutrin due to stomach pain.  "just not where I feel like I should be".  She continues to see Berniece Andreas, and went from every 2 weeks down to every 3 weeks.  She feels tired.  Crying less, but still periodically gets emotional build-up and needs to cry, occuring about once a week (previously had been crying 3x/week).  Has gotten some better with therapy.  She continues to have a lot of anxiety, worries a lot (whole life).    Wellbutrin bothered her stomach, she couldn't eat. She took it for just over a week, after problems started 5-6 days into taking it.  Complained of abdominal fullness and pain, "like someone hit me in the stomach with their fist".  Pain resolved within 2 days of stopping the Wellbutrin.  At night, when she goes to bed, if she lies on her left side, she is more aware of the irregular heartbeat.  Doesn't feel it at all on her back, feels less when on her right side, but lying on her right side bothers her neck.  Noticed lumps on her RUE.  She has had one on her right upper arm for at least 30 years.  She has noticed a very gradual increase in size, no sudden increase. Nontender.  She was told it was noticeable related to weight loss.  She noticed another lump in the RUE in bicep area.  She thinks she noticed it after losing weight related to her recent GI issue.  Hasn't noticed any change in size in the last month.  No longer having any stomach pain.  Appetite is fair.  No further  weight loss (down one pound per our records), but appetite is much better.  Past Medical History  Diagnosis Date  . Depression     treated in the past for years;stopped in 2010 for a years  . Panic attack   . Claustrophobia   . Fibromyalgia   . IBS (irritable bowel syndrome)   . Hypercholesteremia     with high HDL  . Glaucoma, narrow-angle     s/p laser surgery  . Duodenal ulcer 1962    h/o  . Hyperthyroidism     h/o x 2 years  . Shingles 1999    h/o  . Hypothyroid 9/08  . GERD (gastroesophageal reflux disease)   . Trochanteric bursitis 12/2008    bilateral  . Superficial thrombophlebitis 03/2009    RLE  . Bell's palsy 1966  . Osteoporosis 10/11    Dr.Hawkes  . SLE (systemic lupus erythematosus)     with arthralgia/myalgia, positive ANA centromere pattern, positive DNA, positive RNP, indeterminant anti-cardiolipin antibody-Dr.Hawkes.  . Carotid artery disease 2010    on vascular screening;unchanged 2013.(could not tolerate simvastatin, no other statins tried)  . Recurrent UTI     has cystocele-Dr.Grewal   Past Surgical History  Procedure Laterality Date  . Tonsillectomy  1946  . Vaginal hysterectomy  1971  and bladder repair.  Still has ovaries  . Cataract extraction, bilateral  1995, 1996  . Thyroidectomy, partial  09/2005    L nodule; Dr. Gerrit Friends  . Upper gi endoscopy  06/27/12  . Flexible sigmoidoscopy     History   Social History  . Marital Status: Married    Spouse Name: N/A    Number of Children: 2  . Years of Education: N/A   Occupational History  . retired (school system)    Social History Main Topics  . Smoking status: Never Smoker   . Smokeless tobacco: Never Used  . Alcohol Use: No  . Drug Use: No  . Sexually Active: Not on file   Other Topics Concern  . Not on file   Social History Narrative   Married.  Son lives in [place]; Daughter [name] lives in [place]; 2 grandchildren   Current Outpatient Prescriptions on File Prior to Visit   Medication Sig Dispense Refill  . Acetaminophen (TYLENOL ARTHRITIS EXT RELIEF PO) Take 500 mg by mouth daily.      Marland Kitchen ALPRAZolam (XANAX) 0.5 MG tablet TAKE 1/2 TO 1 TABLET BY MOUTH AS NEEDED FOR ANXIETY  30 tablet  0  . ARTIFICIAL TEAR OP Apply 1-2 drops to eye.      Marland Kitchen aspirin 81 MG tablet Take 81 mg by mouth daily.      Marland Kitchen b complex vitamins capsule Take 1 capsule by mouth 3 (three) times daily.       . cholecalciferol (VITAMIN D) 1000 UNITS tablet Take 2,000 Units by mouth daily.       Marland Kitchen levothyroxine (SYNTHROID) 25 MCG tablet Take 25 mcg by mouth as directed.       Marland Kitchen levothyroxine (SYNTHROID) 50 MCG tablet Take 50 mcg by mouth as directed.       . vitamin E 400 UNIT capsule Take 400 Units by mouth. 3 times weekly       No current facility-administered medications on file prior to visit.   Allergies  Allergen Reactions  . Salmon (Fish Allergy) Hives and Shortness Of Breath  . Aspirin Other (See Comments)    Sever stomach pain due to ulcer scaring.  . Ciprofloxacin Diarrhea  . Codeine Nausea And Vomiting  . Darvocet (Propoxyphene-Acetaminophen) Nausea And Vomiting  . Demerol (Meperidine) Nausea And Vomiting  . Diphedryl (Diphenhydramine) Other (See Comments)    Increased pulse/small amount ok  . Doxycycline Hyclate Other (See Comments)    GI intolerance.  Marland Kitchen Epinephrine Other (See Comments)    Breathing problems  . Erythromycin Other (See Comments)    GI intolerance.  . Flexeril (Cyclobenzaprine) Other (See Comments)    Tingly/prickly sensation.  . Iodine   . Keflex (Cephalexin) Hives  . Latex Other (See Comments)    Gloves ok  . Penicillins   . Prednisone Other (See Comments)    Headache  . Pylera (Bis Subcit-Metronid-Tetracyc) Swelling    Tongue swelling.  . Shellfish Allergy   . Sulfa Antibiotics Other (See Comments)    Increased pulse, fainting, diarrhea  . Xylocaine (Lidocaine Hcl)   . Zoloft (Sertraline Hcl) Swelling and Other (See Comments)    Migraine Swelling  of tongue/lip (09/2012)  . Advil (Ibuprofen) Rash    Motrin ok with a GI effect.   ROS:  R sided neck pain intermittently, worse when she lies on right side, and notes some soreness of R collarbone.  Used a sling which helped.  Denies fevers, URI symptoms, cough, shortness of breath, chest  pain.  +palpitations.  Denies GI or GU complaints, bleeding/bruising.  +depression/anxiety.  See HPI  PHYSICAL EXAM: BP 110/60  Pulse 64  Ht 4\' 11"  (1.499 m)  Wt 117 lb (53.071 kg)  BMI 23.62 kg/m2 Well developed, pleasant female in no distress.  Right upper arm, medially.  There is a 3.5 x 2 cm soft tissue, fleshy/fatty feeling soft mobile mass, subcutaneous.  Over the biceps area there is a similar feeling subcutaneous mass, measuring 2 x 1-1.5 cm. Neck: no lymphadenopathy or mass  Pulse:  PVC felt about every 4th beat (slight pause and weaker pulse felt every 4th beat).  Subsequently, listening to heart, her rhythm is regular, but PVC's are more irregular/sporadic, from every 4th to every 10th beat. Nor murmurs, rubs Lungs: clear bilaterally  Mild tenderness over R clavicle.  No significant soft tissue swelling is noted. Psych: normal affect, full range, frequently smiling.  Normal speech, eye contact, hygiene and grooming. Neuro: alert and oriented.  Cranial nerves intact. Normal gait, strength.  ASSESSMENT/PLAN:  Depressive disorder, not elsewhere classified - Plan: FLUoxetine (PROZAC) 10 MG tablet  Anxiety state, unspecified  PVC's (premature ventricular contractions)  Lipoma of arm  Depression and anxiety:  Improving with therapy, but suboptimally.  Pt would like to start prozac.  Start at 10mg  tablet, at just 1/2 tablet and increase to full tablet if tolerated.  Reviewed risks/side effects.  Continue with therapy.  Lipomas--reassured.  Return if increase in size, tenderness, firmness of masses.  Neck/shoulder pain--Counseled re: use of sling, and risk for frozen shoulder (which she has  had before).  Using only for short-term relief, and do ROM exercises daily. Follow up as scheduled with Dr. Nickola Major (rheum) next month.  PVC's, relatively asymptomatic (just when lying down at night she is aware of them).  Reassured.  No treatment needed at this time.  Reassured, reminded what cardiologist told her.   F/u 6 weeks.  She would like lipids checked, so will come fasting to next visit.

## 2012-11-13 NOTE — Patient Instructions (Addendum)
Start the fluoxetine at just 1/2 tablet daily.  Take in the morning.  Increase to full tablet after a week if you aren't having any side effects.  If you are having some mild side effects, stay at the 1/2 tablet for longer.  Stop it if you develop any mouth or tongue swelling.  Continue counseling with Raynelle Fanning.  Your lumps on the arm are likely lipomas.  Return for re-evaluation if they grow rapidly, or become very tender.  Lipoma A lipoma is a noncancerous (benign) tumor composed of fat cells. They are usually found under the skin (subcutaneous). A lipoma may occur in any tissue of the body that contains fat. Common areas for lipomas to appear include the back, shoulders, buttocks, and thighs. Lipomas are a very common soft tissue growth. They are soft and grow slowly. Most problems caused by a lipoma depend on where it is growing. DIAGNOSIS  A lipoma can be diagnosed with a physical exam. These tumors rarely become cancerous, but radiographic studies can help determine this for certain. Studies used may include:  Computerized X-ray scans (CT or CAT scan).  Computerized magnetic scans (MRI). TREATMENT  Small lipomas that are not causing problems may be watched. If a lipoma continues to enlarge or causes problems, removal is often the best treatment. Lipomas can also be removed to improve appearance. Surgery is done to remove the fatty cells and the surrounding capsule. Most often, this is done with medicine that numbs the area (local anesthetic). The removed tissue is examined under a microscope to make sure it is not cancerous. Keep all follow-up appointments with your caregiver. SEEK MEDICAL CARE IF:   The lipoma becomes larger or hard.  The lipoma becomes painful, red, or increasingly swollen. These could be signs of infection or a more serious condition. Document Released: 06/30/2002 Document Revised: 10/02/2011 Document Reviewed: 12/10/2009 [facility] Patient Information 2013 [place],  [place].

## 2012-11-15 DIAGNOSIS — H43819 Vitreous degeneration, unspecified eye: Secondary | ICD-10-CM | POA: Diagnosis not present

## 2012-11-15 DIAGNOSIS — Z961 Presence of intraocular lens: Secondary | ICD-10-CM | POA: Diagnosis not present

## 2012-11-15 DIAGNOSIS — H52209 Unspecified astigmatism, unspecified eye: Secondary | ICD-10-CM | POA: Diagnosis not present

## 2012-11-15 DIAGNOSIS — H40039 Anatomical narrow angle, unspecified eye: Secondary | ICD-10-CM | POA: Diagnosis not present

## 2012-11-20 ENCOUNTER — Ambulatory Visit (INDEPENDENT_AMBULATORY_CARE_PROVIDER_SITE_OTHER): Payer: Medicare Other | Admitting: Licensed Clinical Social Worker

## 2012-11-20 DIAGNOSIS — F411 Generalized anxiety disorder: Secondary | ICD-10-CM

## 2012-11-25 ENCOUNTER — Other Ambulatory Visit: Payer: Self-pay | Admitting: Family Medicine

## 2012-11-25 NOTE — Telephone Encounter (Signed)
Is this okay to refill? 

## 2012-11-25 NOTE — Telephone Encounter (Signed)
Ok for #30 

## 2012-12-02 ENCOUNTER — Ambulatory Visit (INDEPENDENT_AMBULATORY_CARE_PROVIDER_SITE_OTHER): Payer: Medicare Other | Admitting: Licensed Clinical Social Worker

## 2012-12-02 DIAGNOSIS — F411 Generalized anxiety disorder: Secondary | ICD-10-CM | POA: Diagnosis not present

## 2012-12-04 ENCOUNTER — Encounter: Payer: Self-pay | Admitting: Family Medicine

## 2012-12-06 DIAGNOSIS — IMO0001 Reserved for inherently not codable concepts without codable children: Secondary | ICD-10-CM | POA: Diagnosis not present

## 2012-12-06 DIAGNOSIS — M25519 Pain in unspecified shoulder: Secondary | ICD-10-CM | POA: Diagnosis not present

## 2012-12-06 DIAGNOSIS — M349 Systemic sclerosis, unspecified: Secondary | ICD-10-CM | POA: Diagnosis not present

## 2012-12-18 ENCOUNTER — Ambulatory Visit (INDEPENDENT_AMBULATORY_CARE_PROVIDER_SITE_OTHER): Payer: Medicare Other | Admitting: Licensed Clinical Social Worker

## 2012-12-18 DIAGNOSIS — F411 Generalized anxiety disorder: Secondary | ICD-10-CM | POA: Diagnosis not present

## 2013-01-01 ENCOUNTER — Telehealth: Payer: Self-pay | Admitting: *Deleted

## 2013-01-01 ENCOUNTER — Encounter: Payer: Self-pay | Admitting: Family Medicine

## 2013-01-01 ENCOUNTER — Ambulatory Visit (INDEPENDENT_AMBULATORY_CARE_PROVIDER_SITE_OTHER): Payer: Medicare Other | Admitting: Family Medicine

## 2013-01-01 VITALS — BP 160/80 | HR 64 | Ht 59.0 in | Wt 116.0 lb

## 2013-01-01 DIAGNOSIS — E7800 Pure hypercholesterolemia, unspecified: Secondary | ICD-10-CM | POA: Diagnosis not present

## 2013-01-01 DIAGNOSIS — F411 Generalized anxiety disorder: Secondary | ICD-10-CM

## 2013-01-01 DIAGNOSIS — F3289 Other specified depressive episodes: Secondary | ICD-10-CM | POA: Diagnosis not present

## 2013-01-01 DIAGNOSIS — F329 Major depressive disorder, single episode, unspecified: Secondary | ICD-10-CM

## 2013-01-01 LAB — LIPID PANEL
Cholesterol: 207 mg/dL — ABNORMAL HIGH (ref 0–200)
LDL Cholesterol: 114 mg/dL — ABNORMAL HIGH (ref 0–99)
Total CHOL/HDL Ratio: 3.2 Ratio
VLDL: 29 mg/dL (ref 0–40)

## 2013-01-01 NOTE — Progress Notes (Signed)
Chief Complaint  Patient presents with  . Depression    4 week follow up on depression and is fasting for lipids. Wants to talk to you about the 81 mg aspirin, does she really need to take?   "I don't feel depressed right now".  Counseling is helping.   She didn't tolerate the prozac--she took 1/2 tablet (5mg ) for over 2 weeks, and 2 hrs after taking got very shaky and jittery and anxious.  This didn't seem to ever improve/get less so med was stopped.  Continues to see Berniece Andreas, currently seeing her every 3 weeks.    Sometimes she will wake up with panic attacks.  Sometimes needs a whole pill, often just 1/2, maybe every other day (at least 3x/week).  She gets panicky if in a car while it is raining. Intolerant of multiple SSRI's and Wellbutrin.  She stopped taking her aspirin about a month ago, due to bruising.  Bruising improved since she stopped the aspirin, wondering if she needs to be on it.  Review of records shows some possible carotid artery disease (no imaging available, just the information in the history), intolerant of statins.  Also, per history, has ?h/o lupus and indeterminant anti-cardiolipin antibody (labs per Dr. Nickola Major). Paper chart not available to me today.  Neck and shoulder pain--seeing Dr. Nickola Major, who apparently recommended she see an orthopedist.  Having pain in her right collarbone and neck.  She has been doing some home exercises, and something she did yesterday seems to make it worse.  Having muscle spasms today.  Afraid to see ortho because she doesn't want a shot--had significant abdominal pain after last depo medrol injections from ortho.  Hasn't had PT.  Using low dose ibuprofen up to twice daily, with temporary benefit and no GI discomfort   BP 160/80  Pulse 64  Ht 4\' 11"  (1.499 m)  Wt 116 lb (52.617 kg)  BMI 23.42 kg/m2 Pleasant female, in fairly good spirits, but in obvious pain with certain head movements, having spasms in right side of neck.  Tender over  right sternocleidomastoid muscle, extending from behind right ear to clavicle.  Also tender at shoulder at trapezius muscle.  Limited range of motion due to pain.  No lymphadenopathy or mass HEENT:  PERRL, EOMI, conjunctiva and OP clear Heart: regular rate and rhythm, no murmur Lungs: clear bilaterally Abdomen: soft, nontender, no mass Extremities: no edema Skin: normal without rashes Neuro: alert and oriented.  Normal strength, sensation, strength, sensation.  ASSESSMENT/PLAN:  Depressive disorder, not elsewhere classified  Anxiety state, unspecified  Pure hypercholesterolemia - Plan: Lipid panel  Depression--improving with counseling.  Intolerant of medications. Anxiety--still with some panic attacks.  Usually at night.  Continue to use xanax prn, since intolerant of other medications.  Neck pain--agree with her hesitancy about steroid injections.  Discussed types of injections (she may have had intramuscular, which will have more system effects than a joint injection), as well as trigger point injections (that don't need to have steroid in it at all), and that she should be frank in her discussion with Dr. Jillyn Hidden.  I would prefer she have a trial of PT given her intolerance to meds.  She will discuss this with ortho.  Re: question of aspirin.  Discussed potential risks and benefits.  Her risk factors that would suggest benefit from aspirin include carotid artery disease and ?poss anti-cardiolipin.  I would like to see copies of carotid ultrasound.  She has h/o ulcers, but not having problems currently.  She is asking about taking qod to lessen the bruising.  Discussed that this might lessen the benefit, but likely better than not taking anything.

## 2013-01-01 NOTE — Patient Instructions (Signed)
See if you can get hold of carotid artery ultrasound results.  I recommend discussing getting physical therapy with your doctors re: your neck pain.  Use heat and/or ice for the neck pain. Okay to use the ibuprofen as long as not having any stomach pain.  Take with food.  Consider starting an enteric-coated aspirin 81mg  back every other day, if you don't want to take it every day.  I will be in touch with your cholesterol results in the next few days.

## 2013-01-01 NOTE — Telephone Encounter (Signed)
Patient called and left a message letting you know that she had a carotid artery study done 08/09/2011 @ [facility] with Dr.Varanasi. She has called there and asked them to send you a copy. Just an FYI.

## 2013-01-03 DIAGNOSIS — M25819 Other specified joint disorders, unspecified shoulder: Secondary | ICD-10-CM | POA: Diagnosis not present

## 2013-01-03 DIAGNOSIS — G243 Spasmodic torticollis: Secondary | ICD-10-CM | POA: Diagnosis not present

## 2013-01-08 ENCOUNTER — Ambulatory Visit (INDEPENDENT_AMBULATORY_CARE_PROVIDER_SITE_OTHER): Payer: Medicare Other | Admitting: Licensed Clinical Social Worker

## 2013-01-08 DIAGNOSIS — F411 Generalized anxiety disorder: Secondary | ICD-10-CM | POA: Diagnosis not present

## 2013-01-13 ENCOUNTER — Other Ambulatory Visit: Payer: Self-pay | Admitting: Family Medicine

## 2013-01-13 NOTE — Telephone Encounter (Signed)
Is this okay to refill? 

## 2013-01-13 NOTE — Telephone Encounter (Signed)
I called out Xanax per Crosby Oyster PA-C. CLS

## 2013-01-14 ENCOUNTER — Telehealth: Payer: Self-pay | Admitting: Internal Medicine

## 2013-01-14 NOTE — Telephone Encounter (Signed)
It looks like this was called in yesterday. Please check on it.

## 2013-01-14 NOTE — Telephone Encounter (Signed)
Refill request for alprazolam 0.5mg to [facility] 

## 2013-01-14 NOTE — Telephone Encounter (Signed)
Tracy Malone it looks like Tracy Malone called this in yesterday

## 2013-01-21 ENCOUNTER — Encounter: Payer: Self-pay | Admitting: Family Medicine

## 2013-01-21 DIAGNOSIS — M5030 Other cervical disc degeneration, unspecified cervical region: Secondary | ICD-10-CM | POA: Diagnosis not present

## 2013-01-21 DIAGNOSIS — M549 Dorsalgia, unspecified: Secondary | ICD-10-CM | POA: Diagnosis not present

## 2013-01-22 ENCOUNTER — Telehealth: Payer: Self-pay | Admitting: *Deleted

## 2013-01-22 ENCOUNTER — Encounter: Payer: Self-pay | Admitting: Family Medicine

## 2013-01-22 NOTE — Telephone Encounter (Signed)
I forwarded you a patient advice request this am before I got to look in the red folder. I saw the report on Tracy Malone and I did call her and reassure her that you reviewed the carotid artery duplex report. Did reassure her not to worry about the small thyroid nodules and that you do check this at every visit.

## 2013-01-29 ENCOUNTER — Ambulatory Visit (INDEPENDENT_AMBULATORY_CARE_PROVIDER_SITE_OTHER): Payer: Medicare Other | Admitting: Licensed Clinical Social Worker

## 2013-01-29 DIAGNOSIS — F411 Generalized anxiety disorder: Secondary | ICD-10-CM | POA: Diagnosis not present

## 2013-01-30 ENCOUNTER — Encounter: Payer: Self-pay | Admitting: Family Medicine

## 2013-02-03 ENCOUNTER — Encounter: Payer: Self-pay | Admitting: Family Medicine

## 2013-02-10 ENCOUNTER — Ambulatory Visit (INDEPENDENT_AMBULATORY_CARE_PROVIDER_SITE_OTHER): Payer: Medicare Other | Admitting: Licensed Clinical Social Worker

## 2013-02-10 DIAGNOSIS — F411 Generalized anxiety disorder: Secondary | ICD-10-CM

## 2013-02-17 DIAGNOSIS — M5030 Other cervical disc degeneration, unspecified cervical region: Secondary | ICD-10-CM | POA: Diagnosis not present

## 2013-02-17 DIAGNOSIS — M549 Dorsalgia, unspecified: Secondary | ICD-10-CM | POA: Diagnosis not present

## 2013-02-24 ENCOUNTER — Telehealth: Payer: Self-pay | Admitting: Internal Medicine

## 2013-02-24 ENCOUNTER — Other Ambulatory Visit: Payer: Self-pay | Admitting: *Deleted

## 2013-02-24 MED ORDER — ALPRAZOLAM 0.5 MG PO TABS: 0.2500 mg | ORAL_TABLET | ORAL | Status: DC | PRN

## 2013-02-24 NOTE — Telephone Encounter (Signed)
Okay to refill #30.  We have seen her multiple times for same complaint of fatigue and haven't really found a cause.  We have thought that depression was contributing--we can see if she feels worse now that she stopped the prozac (may take a few weeks to notice).  Ensure that she is still getting counseling

## 2013-02-24 NOTE — Telephone Encounter (Signed)
Phoned in rx for #30 and went over Dr.Knapp's recommendations with patient.

## 2013-02-24 NOTE — Telephone Encounter (Signed)
Last filled 6/23.  Please check with pt if she truly needs, or if pharmacy just requesting.  Check on frequency of use

## 2013-02-24 NOTE — Telephone Encounter (Signed)
Refill request for alprazolam 0.5mg to [facility] 

## 2013-02-24 NOTE — Telephone Encounter (Signed)
Spoke with patient and she did request this refill. She states she is using this medication about 3-4 times a week. Has 4 pills left but is going out of town next week to visit her sister and would like to take with her. She also states that she stopped taking the prozac, it was making her so tired and she had no appetite. She wants to know if she can come in and see you either Wed or Thurs this week for the fatigue. Is this okay? Do you have a preference where I should schedule her?

## 2013-02-26 ENCOUNTER — Other Ambulatory Visit: Payer: Self-pay

## 2013-02-26 ENCOUNTER — Ambulatory Visit (INDEPENDENT_AMBULATORY_CARE_PROVIDER_SITE_OTHER): Payer: Medicare Other | Admitting: Licensed Clinical Social Worker

## 2013-02-26 DIAGNOSIS — F411 Generalized anxiety disorder: Secondary | ICD-10-CM | POA: Diagnosis not present

## 2013-03-17 ENCOUNTER — Ambulatory Visit (INDEPENDENT_AMBULATORY_CARE_PROVIDER_SITE_OTHER): Payer: Medicare Other | Admitting: Licensed Clinical Social Worker

## 2013-03-17 DIAGNOSIS — F411 Generalized anxiety disorder: Secondary | ICD-10-CM | POA: Diagnosis not present

## 2013-04-02 ENCOUNTER — Other Ambulatory Visit: Payer: Self-pay | Admitting: Obstetrics and Gynecology

## 2013-04-02 DIAGNOSIS — Z124 Encounter for screening for malignant neoplasm of cervix: Secondary | ICD-10-CM | POA: Diagnosis not present

## 2013-04-02 DIAGNOSIS — Z1231 Encounter for screening mammogram for malignant neoplasm of breast: Secondary | ICD-10-CM | POA: Diagnosis not present

## 2013-04-02 DIAGNOSIS — N6001 Solitary cyst of right breast: Secondary | ICD-10-CM

## 2013-04-03 ENCOUNTER — Telehealth: Payer: Self-pay | Admitting: *Deleted

## 2013-04-03 NOTE — Telephone Encounter (Signed)
Patient informed. 

## 2013-04-03 NOTE — Telephone Encounter (Signed)
It is supposed to be on an empty stomach for best absorption (and not to be taken with other meds).  So, if about 4 hrs after eating, and with no other meds, it should be okay. She can verify recommendations on how to take (if not taken this way) with her pharmacist.

## 2013-04-03 NOTE — Telephone Encounter (Signed)
Patient called and wants to know if you think it would be okay for her to take her Synthroid at night instead of in the morning. Feels it would be easier for her. Please advise.

## 2013-04-04 ENCOUNTER — Ambulatory Visit (INDEPENDENT_AMBULATORY_CARE_PROVIDER_SITE_OTHER): Payer: Medicare Other | Admitting: Licensed Clinical Social Worker

## 2013-04-04 DIAGNOSIS — F411 Generalized anxiety disorder: Secondary | ICD-10-CM

## 2013-04-18 ENCOUNTER — Ambulatory Visit
Admission: RE | Admit: 2013-04-18 | Discharge: 2013-04-18 | Disposition: A | Discharge status: Home / Self Care | Payer: Medicare Other | Source: Ambulatory Visit | Attending: Obstetrics and Gynecology | Admitting: Obstetrics and Gynecology

## 2013-04-18 DIAGNOSIS — N641 Fat necrosis of breast: Secondary | ICD-10-CM | POA: Diagnosis not present

## 2013-04-18 DIAGNOSIS — N6001 Solitary cyst of right breast: Secondary | ICD-10-CM

## 2013-04-21 ENCOUNTER — Telehealth: Payer: Self-pay | Admitting: Internal Medicine

## 2013-04-21 MED ORDER — ALPRAZOLAM 0.5 MG PO TABS: 0.2500 mg | ORAL_TABLET | ORAL | Status: DC | PRN

## 2013-04-21 NOTE — Telephone Encounter (Signed)
Refill request for alprazolam 0.5mg to [facility] 

## 2013-04-21 NOTE — Telephone Encounter (Signed)
Last filled 8/4.  Okay for refill #30

## 2013-04-21 NOTE — Telephone Encounter (Signed)
Done, phoned in. 

## 2013-04-30 ENCOUNTER — Ambulatory Visit (INDEPENDENT_AMBULATORY_CARE_PROVIDER_SITE_OTHER): Payer: Medicare Other | Admitting: Licensed Clinical Social Worker

## 2013-04-30 DIAGNOSIS — F411 Generalized anxiety disorder: Secondary | ICD-10-CM | POA: Diagnosis not present

## 2013-05-06 ENCOUNTER — Encounter: Payer: Self-pay | Admitting: Family Medicine

## 2013-05-14 ENCOUNTER — Other Ambulatory Visit: Payer: Medicare Other

## 2013-05-28 ENCOUNTER — Ambulatory Visit (INDEPENDENT_AMBULATORY_CARE_PROVIDER_SITE_OTHER): Payer: Medicare Other | Admitting: Licensed Clinical Social Worker

## 2013-05-28 DIAGNOSIS — F411 Generalized anxiety disorder: Secondary | ICD-10-CM | POA: Diagnosis not present

## 2013-05-29 ENCOUNTER — Other Ambulatory Visit: Payer: Self-pay

## 2013-06-02 ENCOUNTER — Encounter: Payer: Self-pay | Admitting: Family Medicine

## 2013-06-02 ENCOUNTER — Ambulatory Visit (INDEPENDENT_AMBULATORY_CARE_PROVIDER_SITE_OTHER): Payer: Medicare Other | Admitting: Family Medicine

## 2013-06-02 VITALS — BP 130/68 | HR 76 | Temp 99.0°F | Ht 59.0 in | Wt 120.0 lb

## 2013-06-02 DIAGNOSIS — J029 Acute pharyngitis, unspecified: Secondary | ICD-10-CM

## 2013-06-02 DIAGNOSIS — J0110 Acute frontal sinusitis, unspecified: Secondary | ICD-10-CM | POA: Diagnosis not present

## 2013-06-02 LAB — POCT RAPID STREP A (OFFICE): Rapid Strep A Screen: NEGATIVE

## 2013-06-02 MED ORDER — AZITHROMYCIN 250 MG PO TABS: ORAL_TABLET | ORAL | Status: DC

## 2013-06-02 NOTE — Progress Notes (Signed)
Chief Complaint  Patient presents with  . Sore Throat    ST,cough and runny nose that started last Wed-throat feels extremely sore. Fever started yesterday, low grade.    She had a cold with runny nose, head congestion about 2 weeks ago (so she canceled her flu shot).  She got better, but unsure if she got completely better.  Started feeling worse again 6 days ago.  She developed sore throat, congestion.  When seeing Berniece Andreas last week (5 days ago) her eyes seemed glazed over, had headache.  Congestion has gotten progressively worse.  She is having frontal headaches.  Nasal mucus is yellow-green, has gotten a little lighter and looser compared to last week, but still discolored.  Phlegm is also yellow-green.  Denies any shortness of breath, chest tightness.  Last night she noted fever and chills (T99.4), no fevers prior to this.   She has been gargling, using nasal saline and using neti-pot.  Using tylenol as needed, no other OTC medications.  Phlegm is getting looser and she is able to get the phlegm up and out--saline spray has helped a lot with that.  No known sick contacts, but +exposures at her 62th anniversary party last week. Moods are better, seeing Raynelle Fanning has been very helpful. Still anxious, but overall much better.  Past Medical History  Diagnosis Date  . Depression     treated in the past for years;stopped in 2010 for a years  . Panic attack   . Claustrophobia   . Fibromyalgia   . IBS (irritable bowel syndrome)   . Hypercholesteremia     with high HDL  . Glaucoma, narrow-angle     s/p laser surgery  . Duodenal ulcer 1962    h/o  . Hyperthyroidism     h/o x 2 years  . Shingles 1999    h/o  . Hypothyroid 9/08  . GERD (gastroesophageal reflux disease)   . Trochanteric bursitis 12/2008    bilateral  . Superficial thrombophlebitis 03/2009    RLE  . Bell's palsy 1966  . Osteoporosis 10/11    Dr.Hawkes  . SLE (systemic lupus erythematosus)     with arthralgia/myalgia,  positive ANA centromere pattern, positive DNA, positive RNP, indeterminant anti-cardiolipin antibody-Dr.Hawkes.  . Carotid artery disease 2010    on vascular screening;unchanged 2013.(could not tolerate simvastatin, no other statins tried)--<30% blockage bilat 07/2011  . Recurrent UTI     has cystocele-Dr.Grewal   Past Surgical History  Procedure Laterality Date  . Tonsillectomy  1946  . Vaginal hysterectomy  1971    and bladder repair.  Still has ovaries  . Cataract extraction, bilateral  1995, 1996  . Thyroidectomy, partial  09/2005    L nodule; Dr. Gerrit Friends  . Upper gi endoscopy  06/27/12  . Flexible sigmoidoscopy     History   Social History  . Marital Status: Married    Spouse Name: N/A    Number of Children: 2  . Years of Education: N/A   Occupational History  . retired (school system)    Social History Main Topics  . Smoking status: Never Smoker   . Smokeless tobacco: Never Used  . Alcohol Use: No  . Drug Use: No  . Sexual Activity: Not on file   Other Topics Concern  . Not on file   Social History Narrative   Married.  Son lives in [place]; Daughter [name] lives in [place]; 2 grandchildren   Current outpatient prescriptions:ALPRAZolam Prudy Feeler) 0.5 MG tablet, Take 0.5-1 tablets (  0.25-0.5 mg total) by mouth as needed for sleep or anxiety., Disp: 30 tablet, Rfl: 0;  ARTIFICIAL TEAR OP, Apply 1-2 drops to eye., Disp: , Rfl: ;  b complex vitamins capsule, Take 1 capsule by mouth 3 (three) times daily. , Disp: , Rfl: ;  cholecalciferol (VITAMIN D) 1000 UNITS tablet, Take 2,000 Units by mouth daily. , Disp: , Rfl:  levothyroxine (SYNTHROID) 50 MCG tablet, Take 50 mcg by mouth daily before breakfast. 1/2 tablet Mon, Wed, Fri, full tablet on Tues, Thurs ,Sat and Sun, Disp: , Rfl: ;  Multiple Vitamins-Minerals (MULTIVITAMIN WITH MINERALS) tablet, Take 1 tablet by mouth daily., Disp: , Rfl: ;  vitamin E 400 UNIT capsule, Take 400 Units by mouth. 3 times weekly, Disp: , Rfl:   Acetaminophen (TYLENOL ARTHRITIS EXT RELIEF PO), Take 500 mg by mouth daily., Disp: , Rfl: ;  aspirin 81 MG tablet, Take 81 mg by mouth daily., Disp: , Rfl:   Allergies  Allergen Reactions  . Salmon [Fish Allergy] Hives and Shortness Of Breath  . Aspirin Other (See Comments)    Sever stomach pain due to ulcer scaring.  . Ciprofloxacin Diarrhea  . Codeine Nausea And Vomiting  . Darvocet [Propoxyphene-Acetaminophen] Nausea And Vomiting  . Demerol [Meperidine] Nausea And Vomiting  . Diphedryl [Diphenhydramine] Other (See Comments)    Increased pulse/small amount ok  . Doxycycline Hyclate Other (See Comments)    GI intolerance.  Marland Kitchen Epinephrine Other (See Comments)    Breathing problems  . Erythromycin Other (See Comments)    GI intolerance.  [name] [Cyclobenzaprine] Other (See Comments)    Tingly/prickly sensation.  . Iodine   . Keflex [Cephalexin] Hives  . Latex Other (See Comments)    Gloves ok  . Penicillins   . Prednisone Other (See Comments)    Headache  . Pylera [Bis Subcit-Metronid-Tetracyc] Swelling    Tongue swelling.  . Shellfish Allergy   . Sulfa Antibiotics Other (See Comments)    Increased pulse, fainting, diarrhea  . Xylocaine [Lidocaine Hcl]   . Zoloft [Sertraline Hcl] Swelling and Other (See Comments)    Migraine Swelling of tongue/lip (09/2012)  . Advil [Ibuprofen] Rash    Motrin ok with a GI effect.   All allergies and intolerances reviewed with pt.  She has tolerated z-pak in the past.  ROS:  +fevers, +headache, cough.  Denies chest pain, shortness of breath, nausea, vomiting, diarrhea.  Denies rashes, bleeding, bruising.  Moods are improved, mild anxiety.  Has some arm soreness, but otherwise denies any joint pains or other myalgias.  PHYSICAL EXAM:  BP 130/68  Pulse 76  Temp(Src) 99 F (37.2 C)  Ht 4\' 11"  (1.499 m)  Wt 120 lb (54.432 kg)  BMI 24.22 kg/m2 Well developed female, with frequent sniffling and throat clearing. No coughing.  In no  distress, speaking easily in full sentences.  Hoarse voice. HEENT:  PERRL, EOMI, conjunctiva clear.  TM's and EAC's normal.  OP clear.  Nasal mucosa moderately edematous, L side is severely edematous.  Pale on the right.  No purulence noted.  OP clear.  Mild tenderness over both frontal sinuses, nontender over maxillary sinuses Neck: no lymphadenopathy, thyromegaly or mass Heart: frequent ectopy, sounding like a bigeminy.  Normal rate Lungs: clear bilaterally with good air movement. No wheezes, rales, ronchi Skin: no rash  ASSESSMENT/PLAN:  Acute frontal sinusitis - Plan: azithromycin (ZITHROMAX) 250 MG tablet  Sore throat - Plan: Rapid Strep A   Bacterial sinusitis, likely complicating recent viral illness.  Continue with supportive measures--saline nasal spray and sinus rinses.  Drink plenty of fluids. Sleep with head of bed elevated. Continue tylenol as needed for pain and/or fever Consider using guaifenesin (mucinex) if secretions become thick. Consider claritin if also seem to have any underlying allergies (clear runny nose, sneezing, itchy eyes)--it doesn't appear that you need this now.  Take the z-pak as directed.  Call on day 5 if your are worse rather than improving.  Otherwise, call on day 10 if you aren't completely better (if you still have residual symptoms, I would send in a refill)

## 2013-06-02 NOTE — Patient Instructions (Signed)
Bacterial sinusitis, likely complicating recent viral illness. Continue with supportive measures--saline nasal spray and sinus rinses.  Drink plenty of fluids. Sleep with head of bed elevated. Continue tylenol as needed for pain and/or fever Consider using guaifenesin (mucinex) if secretions become thick. Consider claritin if also seem to have any underlying allergies (clear runny nose, sneezing, itchy eyes)--it doesn't appear that you need this now.  Take the z-pak as directed.  Call on day 5 if your are worse rather than improving.  Otherwise, call on day 10 if you aren't completely better (if you still have residual symptoms, I would send in a refill)

## 2013-06-11 ENCOUNTER — Other Ambulatory Visit: Payer: Self-pay | Admitting: Family Medicine

## 2013-06-11 ENCOUNTER — Telehealth: Payer: Self-pay | Admitting: Family Medicine

## 2013-06-11 DIAGNOSIS — R5381 Other malaise: Secondary | ICD-10-CM | POA: Diagnosis not present

## 2013-06-11 DIAGNOSIS — J0110 Acute frontal sinusitis, unspecified: Secondary | ICD-10-CM

## 2013-06-11 DIAGNOSIS — M329 Systemic lupus erythematosus, unspecified: Secondary | ICD-10-CM | POA: Diagnosis not present

## 2013-06-11 DIAGNOSIS — M810 Age-related osteoporosis without current pathological fracture: Secondary | ICD-10-CM | POA: Diagnosis not present

## 2013-06-11 DIAGNOSIS — IMO0001 Reserved for inherently not codable concepts without codable children: Secondary | ICD-10-CM | POA: Diagnosis not present

## 2013-06-11 MED ORDER — AZITHROMYCIN 250 MG PO TABS
ORAL_TABLET | ORAL | Status: DC
Start: 2013-06-11 — End: 2013-06-26

## 2013-06-11 NOTE — Telephone Encounter (Signed)
Refill of z-pak sent to pharmacy.  Okay for flu shot (if no contraindications, if she has tolerated in the past--she has so many allergies!).

## 2013-06-11 NOTE — Telephone Encounter (Signed)
Is this okay to refill? I called patient and she states that you told her to call back on day 10 if she was not 100% better and you would do another round of zpak. She is not 100% better, throat still isn't quite there. Please advise, thanks.

## 2013-06-11 NOTE — Telephone Encounter (Signed)
Pt called wanted refill on Zithromycin as she is not 100%.  She still has congestion in throat.  Refill to [facility] on [address]  Also pt wants to know if she can have flu shot.  Please call her [phone]

## 2013-06-13 ENCOUNTER — Other Ambulatory Visit (INDEPENDENT_AMBULATORY_CARE_PROVIDER_SITE_OTHER): Payer: Medicare Other

## 2013-06-13 DIAGNOSIS — Z23 Encounter for immunization: Secondary | ICD-10-CM

## 2013-06-25 ENCOUNTER — Telehealth: Payer: Self-pay | Admitting: *Deleted

## 2013-06-25 ENCOUNTER — Telehealth: Payer: Self-pay | Admitting: Family Medicine

## 2013-06-25 MED ORDER — ALPRAZOLAM 0.5 MG PO TABS: 0.2500 mg | ORAL_TABLET | ORAL | Status: DC | PRN

## 2013-06-25 NOTE — Telephone Encounter (Signed)
Left message for patient to return my call.

## 2013-06-25 NOTE — Telephone Encounter (Signed)
Per notes in computer, she uses 1/2 tablet twice a week.  Last filled 9/29.  Please check with pt (does she use it at bedtime also?) to see if she truly needs or pharmacy did automatically. Fine to refill #30 with no add'l refills if she needs it.

## 2013-06-25 NOTE — Telephone Encounter (Signed)
Spoke with patient and she has been using alprazolam at bedtime occasionally and truly does need this refill. Phoned in #30 0.5mg  with 0 refills.

## 2013-06-26 ENCOUNTER — Ambulatory Visit (INDEPENDENT_AMBULATORY_CARE_PROVIDER_SITE_OTHER): Payer: Medicare Other | Admitting: Family Medicine

## 2013-06-26 ENCOUNTER — Encounter: Payer: Self-pay | Admitting: Family Medicine

## 2013-06-26 VITALS — BP 130/70 | HR 48 | Temp 98.1°F | Ht 59.0 in | Wt 117.0 lb

## 2013-06-26 DIAGNOSIS — R5381 Other malaise: Secondary | ICD-10-CM

## 2013-06-26 DIAGNOSIS — K219 Gastro-esophageal reflux disease without esophagitis: Secondary | ICD-10-CM

## 2013-06-26 DIAGNOSIS — R5383 Other fatigue: Secondary | ICD-10-CM

## 2013-06-26 DIAGNOSIS — I493 Ventricular premature depolarization: Secondary | ICD-10-CM

## 2013-06-26 DIAGNOSIS — E039 Hypothyroidism, unspecified: Secondary | ICD-10-CM | POA: Diagnosis not present

## 2013-06-26 DIAGNOSIS — I498 Other specified cardiac arrhythmias: Secondary | ICD-10-CM | POA: Diagnosis not present

## 2013-06-26 DIAGNOSIS — R61 Generalized hyperhidrosis: Secondary | ICD-10-CM

## 2013-06-26 DIAGNOSIS — I4949 Other premature depolarization: Secondary | ICD-10-CM

## 2013-06-26 DIAGNOSIS — M62838 Other muscle spasm: Secondary | ICD-10-CM | POA: Diagnosis not present

## 2013-06-26 DIAGNOSIS — R001 Bradycardia, unspecified: Secondary | ICD-10-CM

## 2013-06-26 LAB — CBC WITH DIFFERENTIAL/PLATELET
Eosinophils Absolute: 0.1 10*3/uL (ref 0.0–0.7)
Eosinophils Relative: 1 % (ref 0–5)
Hemoglobin: 14.6 g/dL (ref 12.0–15.0)
Lymphocytes Relative: 20 % (ref 12–46)
Lymphs Abs: 1.7 10*3/uL (ref 0.7–4.0)
MCH: 29.5 pg (ref 26.0–34.0)
MCHC: 33.7 g/dL (ref 30.0–36.0)
MCV: 87.5 fL (ref 78.0–100.0)
Monocytes Absolute: 0.7 10*3/uL (ref 0.1–1.0)
Monocytes Relative: 8 % (ref 3–12)
Platelets: 279 10*3/uL (ref 150–400)
RBC: 4.95 MIL/uL (ref 3.87–5.11)

## 2013-06-26 LAB — COMPREHENSIVE METABOLIC PANEL
AST: 17 U/L (ref 0–37)
Albumin: 4 g/dL (ref 3.5–5.2)
Alkaline Phosphatase: 60 U/L (ref 39–117)
Chloride: 107 mEq/L (ref 96–112)
Creat: 0.63 mg/dL (ref 0.50–1.10)
Glucose, Bld: 106 mg/dL — ABNORMAL HIGH (ref 70–99)
Potassium: 4.2 mEq/L (ref 3.5–5.3)
Sodium: 139 mEq/L (ref 135–145)
Total Bilirubin: 1.1 mg/dL (ref 0.3–1.2)
Total Protein: 6.8 g/dL (ref 6.0–8.3)

## 2013-06-26 NOTE — Patient Instructions (Addendum)
Keep food journal--record what symptom you have, recorded with foods that you have eaten, in order to better determine which foods might be causing your IBS symptoms.  Consider meeting with a nutritionist, vs seeing gastroenterologist if having ongoing stomach problems.  Muscle spasm in your left upper back (rhomboid muscle)--use heat, massage, and the stretches as shown  Go to [facility] next week for chest x-ray if you continue to have night sweats.  Use Pepcid prior to dinner--do this regularly for at least a week.

## 2013-06-26 NOTE — Progress Notes (Signed)
Chief Complaint  Patient presents with  . Fatigue    extremely exhausted over the past 3 weeks, but getting worse. For the last 10 days she has been waking up at 3-4am from a deep sleep and she is cold, wet and clammy. Last night her bp and she was unable to obtain a pulse with her machine.   [employer]ley [employer]    feels like she is having alot of gas lately and like her "bowel is compromised and spasming."   On and off for years, but moreso in the last 2 weeks, she has been waking up in the middle of the night feeling cold and clammy (from the waist up).  She hasn't had fevers, not sweating through her clothing, just feels clammy.  BP during these episodes 140's/60's but pulse isn't registering.  She doesn't feel back to normal for about 2 hours.  1/2 of 0.5mg  of xanax is not helpful. She denies feeling anxious at this time, thought to take the xanax "just because I feel so bad" but didn't help.  She is not having heartburn at this time, but does have it earlier in the evening, prior to going to bed. Denies dizziness, numbness, tingling, chest pain, no tachycardia, no shortness of breath with episodes. Feels the pulsation in her base of her skull when she wakes up with these episodes, but it isn't fast.  She does have heartburn symptoms in the evening, before going to bed.  She has taken gaviscon.  She has also taken a pinch of sodium bicarb with immediate relief.  Certain foods trigger her hearburn, including gravy, peanut butter, sweet foods.  She avoids acidic foods, eats dinner early but has a bedtime snack.  She also has IBS, and many foods trigger loose stools.  Her husband feels she is being too restrictive, saying that everything bothers her stomach, and not eating enough.  Chart was reviewed, and her weight has been stable.  She feels that the recent antibiotics affected her stomach, that it still feels a little inflamed, and not back to normal yet.  She denies and bloody stools or any abdominal pain  currently.  She is also having a dry cough, as a remnant from her recent URI.  Denies shortness of breath, doesn't think it is related to reflux.    She feels like she aggravated a muscle in her back due to knitting a lot; pain is bilateral.  She had pain/swelling in the muscles of the back when she was fixing her hair (arms raised above her head).  Currently there is a spot in her upper back that is painful. She has fibromyalgia, so having muscle pain is not new for her.  She had labs done last month at Dr. Nickola Major, but records haven't been received yet.  Going once a month to see Berniece Andreas (therapist), doing well, denies depression.  She had a recent sinus infection, s/p 2 z-paks, and sinus symptoms have improved.  Slight residual drainage and cough. Off ABX just since last week  Past Medical History  Diagnosis Date  . Depression     treated in the past for years;stopped in 2010 for a years  . Panic attack   . Claustrophobia   . Fibromyalgia   . IBS (irritable bowel syndrome)   . Hypercholesteremia     with high HDL  . Glaucoma, narrow-angle     s/p laser surgery  . Duodenal ulcer 1962    h/o  . Hyperthyroidism  h/o x 2 years  . Shingles 1999    h/o  . Hypothyroid 9/08  . GERD (gastroesophageal reflux disease)   . Trochanteric bursitis 12/2008    bilateral  . Superficial thrombophlebitis 03/2009    RLE  . Bell's palsy 1966  . Osteoporosis 10/11    Dr.Hawkes  . SLE (systemic lupus erythematosus)     with arthralgia/myalgia, positive ANA centromere pattern, positive DNA, positive RNP, indeterminant anti-cardiolipin antibody-Dr.Hawkes.  . Carotid artery disease 2010    on vascular screening;unchanged 2013.(could not tolerate simvastatin, no other statins tried)--<30% blockage bilat 07/2011  . Recurrent UTI     has cystocele-Dr.Grewal   Past Surgical History  Procedure Laterality Date  . Tonsillectomy  1946  . Vaginal hysterectomy  1971    and bladder repair.  Still  has ovaries  . Cataract extraction, bilateral  1995, 1996  . Thyroidectomy, partial  09/2005    L nodule; Dr. Gerrit Friends  . Upper gi endoscopy  06/27/12  . Flexible sigmoidoscopy     History   Social History  . Marital Status: Married    Spouse Name: N/A    Number of Children: 2  . Years of Education: N/A   Occupational History  . retired (school system)    Social History Main Topics  . Smoking status: Never Smoker   . Smokeless tobacco: Never Used  . Alcohol Use: No  . Drug Use: No  . Sexual Activity: Not on file   Other Topics Concern  . Not on file   Social History Narrative   Married.  Son lives in [place]; Daughter [name] lives in [place]; 2 grandchildren   Current outpatient prescriptions:Acetaminophen (TYLENOL ARTHRITIS EXT RELIEF PO), Take 500 mg by mouth daily., Disp: , Rfl: ;  ALPRAZolam (XANAX) 0.5 MG tablet, Take 0.5-1 tablets (0.25-0.5 mg total) by mouth as needed for sleep or anxiety., Disp: 30 tablet, Rfl: 0;  b complex vitamins capsule, Take 1 capsule by mouth 3 (three) times daily. , Disp: , Rfl:  cholecalciferol (VITAMIN D) 1000 UNITS tablet, Take 2,000 Units by mouth daily. , Disp: , Rfl: ;  levothyroxine (SYNTHROID) 50 MCG tablet, Take 50 mcg by mouth daily before breakfast. 1/2 tablet Mon, Wed, Fri, full tablet on Tues, Thurs ,Sat and Sun, Disp: , Rfl: ;  vitamin E 400 UNIT capsule, Take 400 Units by mouth. 3 times weekly, Disp: , Rfl: ;  ARTIFICIAL TEAR OP, Apply 1-2 drops to eye., Disp: , Rfl:   Allergies  Allergen Reactions  . Salmon [Fish Allergy] Hives and Shortness Of Breath  . Aspirin Other (See Comments)    Sever stomach pain due to ulcer scaring.  . Ciprofloxacin Diarrhea  . Codeine Nausea And Vomiting  . Darvocet [Propoxyphene-Acetaminophen] Nausea And Vomiting  . Demerol [Meperidine] Nausea And Vomiting  . Diphedryl [Diphenhydramine] Other (See Comments)    Increased pulse/small amount ok  . Doxycycline Hyclate Other (See Comments)    GI  intolerance.  Marland Kitchen Epinephrine Other (See Comments)    Breathing problems  . Erythromycin Other (See Comments)    GI intolerance.  [name] [Cyclobenzaprine] Other (See Comments)    Tingly/prickly sensation.  . Iodine   . Keflex [Cephalexin] Hives  . Latex Other (See Comments)    Gloves ok  . Penicillins   . Prednisone Other (See Comments)    Headache  . Pylera [Bis Subcit-Metronid-Tetracyc] Swelling    Tongue swelling.  . Shellfish Allergy   . Sulfa Antibiotics Other (See Comments)  Increased pulse, fainting, diarrhea  . Xylocaine [Lidocaine Hcl]   . Zoloft [Sertraline Hcl] Swelling and Other (See Comments)    Migraine Swelling of tongue/lip (09/2012)  . Advil [Ibuprofen] Rash    Motrin ok with a GI effect.    ROS:  No fevers, +night sweats/clamminess.  Denies dizziness, chest pain, palpitations, shortness of breath, vomiting, blood in stool, urinary complaints, bleeding, bruising, depression.  Occasional anxiety.  +dry cough and heartburn as per HPI.  +fatigue.  PHYSICAL EXAM: BP 170/70  Pulse 48  Temp(Src) 98.1 F (36.7 C) (Oral)  Ht 4\' 11"  (1.499 m)  Wt 117 lb (53.071 kg)  BMI 23.62 kg/m2 Pleasant female, accompanied by her husband, in no distress.   HEENT:  PERRL, EOMI, conjunctiva clear.  OP clear, sinuses nontender. Neck: no lymphadenopathy, thyromegaly or carotid bruit Heart: no murmur, rub, gallop.  Bigeminy, rate of 80 Lungs: clear bilaterally Back: no spine tenderness or CVA tenderness.  Very mild tenderness over trapezius bilaterally.  +focal area of tenderness and spasm at left rhomboid muscle. Abdomen: soft, nontender, no mass Extremities: no edema Skin: no rashes, bruising Psych: normal mood, affect, hygiene, grooming, speech and eye contact Neuro: alert and oriented.  Cranial nerves intact, normal strength, DTR's, gait  ASSESSMENT/PLAN:  Fatigue - differential diagnosis reviewed in detail with pt and husband, including allergies, recent URI, meds,  fibromyalgia, depression, poor sleep, thyroid, etc.  - Plan: Comprehensive metabolic panel, CBC with Differential, TSH  Unspecified hypothyroidism - Plan: TSH  Bradycardia - reassured that rate today is normal, frequent PVCs/bigeminy not likely symptomatic  Muscle spasm - L rhomboid.  heat/massage, shown stretches  Night sweats - mild; recent illness, plus postmenopausal.  rec CXR if persistent/worsening. recheck TSH - Plan: DG Chest 2 View  PVC's (premature ventricular contractions)  GERD (gastroesophageal reflux disease) - diet/precautions reviewed.  intolerant of PPI's.  recommended regular use of Pepcid prior to dinner (and BID if needed) for at least a week, longterm if needed  Keep journal re: foods and IBS, to see if she can get a better sense of which foods truly bother her stomach and to avoid those Consider going back to GI (but not interested in meds, so avoid for now). Consider referral to nutritionist to work with her on her diet.  Reassured husband that despite her self-imposed restrictions, she has not lost weight.  CXR--to get next week if still having night sweats  40-45 minute visit, more than 1/2 spent counseling

## 2013-06-27 ENCOUNTER — Encounter: Payer: Self-pay | Admitting: Family Medicine

## 2013-06-27 DIAGNOSIS — K219 Gastro-esophageal reflux disease without esophagitis: Secondary | ICD-10-CM | POA: Insufficient documentation

## 2013-06-27 NOTE — Telephone Encounter (Signed)
done

## 2013-07-02 ENCOUNTER — Ambulatory Visit (INDEPENDENT_AMBULATORY_CARE_PROVIDER_SITE_OTHER): Payer: Medicare Other | Admitting: Licensed Clinical Social Worker

## 2013-07-02 DIAGNOSIS — F411 Generalized anxiety disorder: Secondary | ICD-10-CM | POA: Diagnosis not present

## 2013-07-03 ENCOUNTER — Telehealth: Payer: Self-pay | Admitting: *Deleted

## 2013-07-03 NOTE — Telephone Encounter (Signed)
I think that is fine.  We talked about using Pepcid (either before dinner, or BID if needed), ensure she is doing this, as well as probiotic.  We also talked about getting CXR if night sweats persisted.  Has this improved (no CXR done yet).  Ok for her to f/u with Dr. Elnoria Howard. She shouldn't need referral, is a pt there.

## 2013-07-03 NOTE — Telephone Encounter (Signed)
Patient advised.

## 2013-07-03 NOTE — Telephone Encounter (Signed)
Patient called and states that she thinks she needs to see Dr.Hung. She states that after I saw her in [facility] Tuesday she became so weak after walking around for a few minutes that she had to go out to the car to wait for her husband to finish the shopping. She said most of her issues seem to be in her abdominal area and just does not feel right. Would like to know what you think she should do.

## 2013-07-04 ENCOUNTER — Ambulatory Visit: Payer: Medicare Other | Admitting: Licensed Clinical Social Worker

## 2013-07-04 ENCOUNTER — Emergency Department (HOSPITAL_COMMUNITY): Payer: Medicare Other

## 2013-07-04 ENCOUNTER — Emergency Department (HOSPITAL_COMMUNITY)
Admission: EM | Admit: 2013-07-04 | Discharge: 2013-07-04 | Disposition: A | Discharge status: Home / Self Care | Payer: Medicare Other | Attending: Emergency Medicine | Admitting: Emergency Medicine

## 2013-07-04 ENCOUNTER — Encounter (HOSPITAL_COMMUNITY): Payer: Self-pay | Admitting: Emergency Medicine

## 2013-07-04 DIAGNOSIS — F329 Major depressive disorder, single episode, unspecified: Secondary | ICD-10-CM | POA: Diagnosis not present

## 2013-07-04 DIAGNOSIS — Z8619 Personal history of other infectious and parasitic diseases: Secondary | ICD-10-CM | POA: Diagnosis not present

## 2013-07-04 DIAGNOSIS — F410 Panic disorder [episodic paroxysmal anxiety] without agoraphobia: Secondary | ICD-10-CM | POA: Diagnosis not present

## 2013-07-04 DIAGNOSIS — R5381 Other malaise: Secondary | ICD-10-CM | POA: Diagnosis not present

## 2013-07-04 DIAGNOSIS — K219 Gastro-esophageal reflux disease without esophagitis: Secondary | ICD-10-CM | POA: Diagnosis not present

## 2013-07-04 DIAGNOSIS — Z8669 Personal history of other diseases of the nervous system and sense organs: Secondary | ICD-10-CM | POA: Diagnosis not present

## 2013-07-04 DIAGNOSIS — F3289 Other specified depressive episodes: Secondary | ICD-10-CM | POA: Insufficient documentation

## 2013-07-04 DIAGNOSIS — Z8739 Personal history of other diseases of the musculoskeletal system and connective tissue: Secondary | ICD-10-CM | POA: Diagnosis not present

## 2013-07-04 DIAGNOSIS — Z880 Allergy status to penicillin: Secondary | ICD-10-CM | POA: Diagnosis not present

## 2013-07-04 DIAGNOSIS — E0590 Thyrotoxicosis, unspecified without thyrotoxic crisis or storm: Secondary | ICD-10-CM | POA: Insufficient documentation

## 2013-07-04 DIAGNOSIS — Z91040 Latex allergy status: Secondary | ICD-10-CM | POA: Insufficient documentation

## 2013-07-04 DIAGNOSIS — Z8679 Personal history of other diseases of the circulatory system: Secondary | ICD-10-CM | POA: Insufficient documentation

## 2013-07-04 DIAGNOSIS — Z79899 Other long term (current) drug therapy: Secondary | ICD-10-CM | POA: Insufficient documentation

## 2013-07-04 DIAGNOSIS — G8929 Other chronic pain: Secondary | ICD-10-CM | POA: Insufficient documentation

## 2013-07-04 DIAGNOSIS — R5383 Other fatigue: Secondary | ICD-10-CM | POA: Insufficient documentation

## 2013-07-04 DIAGNOSIS — Z87440 Personal history of urinary (tract) infections: Secondary | ICD-10-CM | POA: Insufficient documentation

## 2013-07-04 DIAGNOSIS — R531 Weakness: Secondary | ICD-10-CM

## 2013-07-04 HISTORY — DX: Other malaise: R53.81

## 2013-07-04 HISTORY — DX: Ventricular premature depolarization: I49.3

## 2013-07-04 HISTORY — DX: Other chronic pain: G89.29

## 2013-07-04 HISTORY — DX: Unspecified abdominal pain: R10.9

## 2013-07-04 HISTORY — DX: Chronic fatigue, unspecified: R53.82

## 2013-07-04 LAB — COMPREHENSIVE METABOLIC PANEL
AST: 18 U/L (ref 0–37)
Albumin: 4 g/dL (ref 3.5–5.2)
BUN: 15 mg/dL (ref 6–23)
Chloride: 106 mEq/L (ref 96–112)
Creatinine, Ser: 0.78 mg/dL (ref 0.50–1.10)
GFR calc Af Amer: 90 mL/min — ABNORMAL LOW (ref >90)
Potassium: 3.9 mEq/L (ref 3.5–5.1)
Total Bilirubin: 1.2 mg/dL (ref 0.3–1.2)

## 2013-07-04 LAB — CBC WITH DIFFERENTIAL/PLATELET
Eosinophils Absolute: 0.2 10*3/uL (ref 0.0–0.7)
Lymphocytes Relative: 22 % (ref 12–46)
Lymphs Abs: 2.3 10*3/uL (ref 0.7–4.0)
Monocytes Absolute: 0.8 10*3/uL (ref 0.1–1.0)
Neutro Abs: 7.2 10*3/uL (ref 1.7–7.7)
Neutrophils Relative %: 69 % (ref 43–77)
Platelets: 227 10*3/uL (ref 150–400)
RBC: 4.96 MIL/uL (ref 3.87–5.11)
WBC: 10.5 10*3/uL (ref 4.0–10.5)

## 2013-07-04 LAB — MAGNESIUM: Magnesium: 2.1 mg/dL (ref 1.5–2.5)

## 2013-07-04 LAB — URINALYSIS W MICROSCOPIC + REFLEX CULTURE
Bilirubin Urine: NEGATIVE
Glucose, UA: NEGATIVE mg/dL
Specific Gravity, Urine: 1.012 (ref 1.005–1.030)
pH: 5.5 (ref 5.0–8.0)

## 2013-07-04 LAB — LACTIC ACID, PLASMA: Lactic Acid, Venous: 1.4 mmol/L (ref 0.5–2.2)

## 2013-07-04 LAB — LIPASE, BLOOD: Lipase: 46 U/L (ref 11–59)

## 2013-07-04 LAB — TROPONIN I: Troponin I: <0.3 ng/mL (ref <0.30)

## 2013-07-04 NOTE — ED Provider Notes (Signed)
CSN: [id number]     Arrival date & time 07/04/13  4098 History   First MD Initiated Contact with Patient 07/04/13 1006     Chief Complaint  Patient presents with  . Weakness    HPI Pt was seen at 1020. Per pt, c/o gradual onset and persistence of constant acute flair of her chronic generalized weakness and fatigue for the past 1 year, worse over the past 1 month. Has been associated with "waking up in a cold sweat" at night.  Pt states she was extensively evaluated by her PMD on 06/26/13 for same without definitive diagnosis. Pt states she continues to feel "tired" and "weak." Denies any new symptoms. Denies N/V/D, no cough/SOB, no CP/palpitations, no abd pain, no back pain, no fevers, no focal motor weakness, no tingling/numbness in extremities, no syncope/near syncope.    Past Medical History  Diagnosis Date  . Depression     treated in the past for years;stopped in 2010 for a years  . Panic attack   . Claustrophobia   . Fibromyalgia   . IBS (irritable bowel syndrome)   . Hypercholesteremia     with high HDL  . Glaucoma, narrow-angle     s/p laser surgery  . Duodenal ulcer 1962    h/o  . Hyperthyroidism     h/o x 2 years  . Shingles 1999    h/o  . Hypothyroid 9/08  . GERD (gastroesophageal reflux disease)   . Trochanteric bursitis 12/2008    bilateral  . Superficial thrombophlebitis 03/2009    RLE  . Bell's palsy 1966  . Osteoporosis 10/11    Dr.Hawkes  . SLE (systemic lupus erythematosus)     with arthralgia/myalgia, positive ANA centromere pattern, positive DNA, positive RNP, indeterminant anti-cardiolipin antibody-Dr.Hawkes.  . Carotid artery disease 2010    on vascular screening;unchanged 2013.(could not tolerate simvastatin, no other statins tried)--<30% blockage bilat 07/2011  . Recurrent UTI     has cystocele-Dr.Grewal  . Chronic fatigue and malaise   . Chronic abdominal pain   . 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 Tonsillectomy  1946  . Vaginal hysterectomy  1971    and bladder repair.  Still has ovaries  . Cataract extraction, bilateral  1995, 1996  . Thyroidectomy, partial  09/2005    L nodule; Dr. Gerrit Friends  . Upper gi endoscopy  06/27/12  . Flexible sigmoidoscopy     Family History  Problem Relation Age of Onset  . Heart disease Mother   . Hypertension Mother   . Hypertension Sister   . Diabetes Maternal Grandfather   . Heart disease Brother   . Cancer Brother     lung   History  Substance Use Topics  . Smoking status: Never Smoker   . Smokeless tobacco: Never Used  . Alcohol Use: No    Review of Systems ROS: Statement: All systems negative except as marked or noted in the HPI; Constitutional: Negative for fever and chills.+generalized weakness/fatigue. ; ; Eyes: Negative for eye pain, redness and discharge. ; ; ENMT: Negative for ear pain, hoarseness, nasal congestion, sinus pressure and sore throat. ; ; Cardiovascular: Negative for chest pain, palpitations, diaphoresis, dyspnea and peripheral edema. ; ; Respiratory: Negative for cough, wheezing and stridor. ; ; Gastrointestinal: Negative for nausea, vomiting, diarrhea, abdominal pain, blood in stool, hematemesis, jaundice and rectal bleeding. . ; ; Genitourinary: Negative for dysuria,  flank pain and hematuria. ; ; Musculoskeletal: Negative for back pain and neck pain. Negative for swelling and trauma.; ; Skin: Negative for pruritus, rash, abrasions, blisters, bruising and skin lesion.; ; Neuro: Negative for headache, lightheadedness and neck stiffness. Negative for altered level of consciousness , altered mental status, extremity weakness, paresthesias, involuntary movement, seizure and syncope.       Allergies  Salmon; Shellfish allergy; Aspirin; Ciprofloxacin; Codeine; Darvocet; Demerol; Diphedryl; Doxycycline hyclate; Epinephrine; Erythromycin; Flexeril; Iodine; Keflex; Latex;  Prednisone; Pylera; Sulfa antibiotics; Xylocaine; Zoloft; Advil; and Penicillins  Home Medications   Current Outpatient Rx  Name  Route  Sig  Dispense  Refill  . ALPRAZolam (XANAX) 0.5 MG tablet   Oral   Take 0.25-0.5 mg by mouth 3 (three) times daily as needed for anxiety or sleep.         Marland Kitchen ARTIFICIAL TEAR OP   Ophthalmic   Apply 1-2 drops to eye 4 (four) times daily as needed (dry eyes).          Marland Kitchen b complex vitamins capsule   Oral   Take 1 capsule by mouth 2 (two) times daily.          . cholecalciferol (VITAMIN D) 1000 UNITS tablet   Oral   Take 1,000 Units by mouth 2 (two) times daily.          Marland Kitchen levothyroxine (SYNTHROID) 50 MCG tablet   Oral   Take 25-50 mcg by mouth daily before breakfast. 25 mcg half tablet Mon, Wed, Fri, 50 mcg full tablet on Tues, Thurs ,Sat and Sun         . omeprazole (PRILOSEC) 20 MG capsule   Oral   Take 20 mg by mouth daily as needed (heartburn).         . vitamin E 400 UNIT capsule   Oral   Take 400 Units by mouth every Monday, Wednesday, and Friday. 3 times weekly          BP 114/49  Pulse 83  Temp(Src) 97.5 F (36.4 C) (Oral)  Resp 18  SpO2 98% Physical Exam 1025: Physical examination:  Nursing notes reviewed; Vital signs and O2 SAT reviewed;  Constitutional: Well developed, Well nourished, Well hydrated, In no acute distress; Head:  Normocephalic, atraumatic; Eyes: EOMI, PERRL, No scleral icterus; ENMT: TM's clear bilat. +edemetous nasal turbinates bilat with clear rhinorrhea. Mouth and pharynx normal, Mucous membranes moist; Neck: Supple, Full range of motion, No lymphadenopathy; Cardiovascular: Regular rate and rhythm, No gallop; Respiratory: Breath sounds clear & equal bilaterally, No wheezes.  Speaking full sentences with ease, Normal respiratory effort/excursion; Chest: Nontender, Movement normal; Abdomen: Soft, Nontender, Nondistended, Normal bowel sounds; Genitourinary: No CVA tenderness; Extremities: Pulses normal,  No tenderness, No edema, No calf edema or asymmetry.; Neuro: AA&Ox3, Major CN grossly intact. No facial droop. Speech clear. Climbs on and off stretcher easily by herself. Gait steady. No gross focal motor or sensory deficits in extremities.; Skin: Color normal, Warm, Dry.   ED Course  Procedures   EKG Interpretation    Date/Time:  Friday July 04 2013 09:36:26 EST Ventricular Rate:  92 PR Interval:  166 QRS Duration: 104 QT Interval:  370 QTC Calculation: 457 R Axis:   -2 Text Interpretation:  Sinus rhythm with occasional Premature ventricular complexes in a pattern of bigeminy and Possible Premature atrial complexes with Abberant conduction Nonspecific ST and T wave abnormality Lateral leads Abnormal ECG When compared with ECG of 08/02/2012, Premature ventricular complexes are  More frequent Otherwise  no significant change Confirmed by [facility]  MD, Nicholos Johns [phone]) on 07/04/2013 10:51:12 AM            MDM  MDM Reviewed: previous chart, nursing note and vitals Reviewed previous: labs and ECG Interpretation: labs, ECG and x-ray   Results for orders placed during the hospital encounter of 07/04/13  URINALYSIS W MICROSCOPIC + REFLEX CULTURE      Result Value Range   Color, Urine YELLOW  YELLOW   APPearance CLEAR  CLEAR   Specific Gravity, Urine 1.012  1.005 - 1.030   pH 5.5  5.0 - 8.0   Glucose, UA NEGATIVE  NEGATIVE mg/dL   Hgb urine dipstick TRACE (*) NEGATIVE   Bilirubin Urine NEGATIVE  NEGATIVE   Ketones, ur NEGATIVE  NEGATIVE mg/dL   Protein, ur NEGATIVE  NEGATIVE mg/dL   Urobilinogen, UA 0.2  0.0 - 1.0 mg/dL   Nitrite NEGATIVE  NEGATIVE   Leukocytes, UA SMALL (*) NEGATIVE   WBC, UA 3-6  <3 WBC/hpf   RBC / HPF 0-2  <3 RBC/hpf   Bacteria, UA RARE  RARE   Squamous Epithelial / LPF RARE  RARE  CBC WITH DIFFERENTIAL      Result Value Range   WBC 10.5  4.0 - 10.5 K/uL   RBC 4.96  3.87 - 5.11 MIL/uL   Hemoglobin 14.9  12.0 - 15.0 g/dL   HCT 91.4  78.2 - 95.6 %    MCV 90.3  78.0 - 100.0 fL   MCH 30.0  26.0 - 34.0 pg   MCHC 33.3  30.0 - 36.0 g/dL   RDW 21.3  08.6 - 57.8 %   Platelets 227  150 - 400 K/uL   Neutrophils Relative % 69  43 - 77 %   Neutro Abs 7.2  1.7 - 7.7 K/uL   Lymphocytes Relative 22  12 - 46 %   Lymphs Abs 2.3  0.7 - 4.0 K/uL   Monocytes Relative 7  3 - 12 %   Monocytes Absolute 0.8  0.1 - 1.0 K/uL   Eosinophils Relative 2  0 - 5 %   Eosinophils Absolute 0.2  0.0 - 0.7 K/uL   Basophils Relative 1  0 - 1 %   Basophils Absolute 0.1  0.0 - 0.1 K/uL  LACTIC ACID, PLASMA      Result Value Range   Lactic Acid, Venous 1.4  0.5 - 2.2 mmol/L  TROPONIN I      Result Value Range   Troponin I <0.30  <0.30 ng/mL  COMPREHENSIVE METABOLIC PANEL      Result Value Range   Sodium 142  135 - 145 mEq/L   Potassium 3.9  3.5 - 5.1 mEq/L   Chloride 106  96 - 112 mEq/L   CO2 24  19 - 32 mEq/L   Glucose, Bld 97  70 - 99 mg/dL   BUN 15  6 - 23 mg/dL   Creatinine, Ser 4.69  0.50 - 1.10 mg/dL   Calcium 9.6  8.4 - 62.9 mg/dL   Total Protein 7.7  6.0 - 8.3 g/dL   Albumin 4.0  3.5 - 5.2 g/dL   AST 18  0 - 37 U/L   ALT 11  0 - 35 U/L   Alkaline Phosphatase 64  39 - 117 U/L   Total Bilirubin 1.2  0.3 - 1.2 mg/dL   GFR calc non Af Amer 77 (*) >90 mL/min   GFR calc Af Amer 90 (*) >90 mL/min  LIPASE, BLOOD      Result Value Range   Lipase 46  11 - 59 U/L  MAGNESIUM      Result Value Range   Magnesium 2.1  1.5 - 2.5 mg/dL   Dg Chest 2 View 09/81/1914   CLINICAL DATA:  Weakness.  EXAM: CHEST  2 VIEW  COMPARISON:  April 22, 2012.  FINDINGS: The heart size and mediastinal contours are within normal limits. No pleural effusion or pneumothorax is noted. Right infrahilar opacity is noted which is unchanged compared to prior exam and most consistent with scarring. Left lung is clear. The visualized skeletal structures are unremarkable.  IMPRESSION: No acute cardiopulmonary abnormality seen.   Electronically Signed   By: Roque Lias M.D.   On:  07/04/2013 11:34    1240:  Pt is not orthostatic. Monitor continues NSR with frequent PVC's. VSS, resps easy, neuro exam intact/unchanged. Has been ambulatory several times while in the ED, gait steady, NAD. Has tol PO well without N/V. States she is ready to go home now. Encouraged to f/u with PMD regarding her chronic symptoms of generalized fatigue/weakness. Verb understanding. Dx and testing d/w pt and family.  Questions answered.  Verb understanding, agreeable to d/c home with outpt f/u.     Laray Anger, DO 07/07/13 [phone]

## 2013-07-04 NOTE — ED Notes (Addendum)
PATIENT REPORTS SHE HAS NOT FELT WELL FOR THE PAST 2 WEEKS OR SO. STATES SHE STARTED WITH A BACTERIAL SINUS INFECTION AND WAS PLACED ON ANTIBIOTICS. STATES SHE HAS A HARD TIME WITH ATB BECAUSE SHE ALSO HAS IBS. STATES SHE HAD DIARRHEA ALSO DURING THIS TIME. LAST DIARRHEA WAS ABOUT 4 DAYS AGO. STATES SHE FINISHED HER ATB ON November 27TH . STATES SHE FOLLOWED UP WITH IVA KNAPP MD IN THE OFFICE. HAD A BATTERY OF LABS DRAWN AND REPORTS THEY WERE UNREMARKABLE. STATES THIS WEEK SHE HAS FELT JUST WEAKER THAN NORMAL. STATES SHE WAS NOT FEELING WELL TODAY SO SHE WENT TO THE MINUTE CLINIC AT [facility] AND THE NP SUGGESTED SHE COME TO THE HOSPITAL TO BE CHECKED "BECAUSE MY PULSE WAS LOW". PT ANIMATELY DENIES ANY CP OR SOB. STATES SHE SIMPLY FEELS WEAK AND THIS IS NOT HER NORMAL.  PT STATES SHE HAD HER PVCS EVALUATED IN January 2013 BY DR Charlton Haws.

## 2013-07-04 NOTE — ED Notes (Signed)
Pt c/o generalized weakness with some diaphoresis x 2 weeks; pt sts hx of PVC and was noted to be bradycardic today

## 2013-07-05 ENCOUNTER — Encounter: Payer: Self-pay | Admitting: Family Medicine

## 2013-07-07 ENCOUNTER — Encounter (HOSPITAL_COMMUNITY): Payer: Self-pay | Admitting: Emergency Medicine

## 2013-07-07 DIAGNOSIS — R1032 Left lower quadrant pain: Secondary | ICD-10-CM | POA: Diagnosis not present

## 2013-07-07 DIAGNOSIS — R1012 Left upper quadrant pain: Secondary | ICD-10-CM | POA: Diagnosis not present

## 2013-07-07 DIAGNOSIS — R197 Diarrhea, unspecified: Secondary | ICD-10-CM | POA: Diagnosis not present

## 2013-07-30 ENCOUNTER — Ambulatory Visit (INDEPENDENT_AMBULATORY_CARE_PROVIDER_SITE_OTHER): Payer: Medicare Other | Admitting: Licensed Clinical Social Worker

## 2013-07-30 DIAGNOSIS — F411 Generalized anxiety disorder: Secondary | ICD-10-CM

## 2013-08-11 ENCOUNTER — Telehealth: Payer: Self-pay | Admitting: *Deleted

## 2013-08-11 DIAGNOSIS — F3289 Other specified depressive episodes: Secondary | ICD-10-CM

## 2013-08-11 DIAGNOSIS — F329 Major depressive disorder, single episode, unspecified: Secondary | ICD-10-CM

## 2013-08-11 MED ORDER — ESCITALOPRAM OXALATE 5 MG PO TABS: ORAL_TABLET | ORAL | Status: DC

## 2013-08-11 NOTE — Telephone Encounter (Signed)
Spoke with patient and went over you recommendations. She wanted me to ask you if you would give her one last try on Lexapro, maybe the lowest dosage and she could cut in half. I did explain again that you really want her to see a psychiatrist, she wants to try this and if it doesn't work then she will consider psych. I told her I would ask.   [facility].

## 2013-08-11 NOTE — Telephone Encounter (Signed)
She hasn't tolerate any medication that I have given her.  If her symptoms of depression aren't adequately treated with therapy alone from Marya Amsler, then I recommend that she consider seeing a psychiatrist.  Dr. Casimiro Needle or Dr. Caprice Beaver are two that I recommend (the former is female, latter is female).

## 2013-08-11 NOTE — Telephone Encounter (Signed)
Spoke with patient and she agreed with all of Dr.Knapp's recommendations.

## 2013-08-11 NOTE — Telephone Encounter (Signed)
Patient called and wanted to know if there was anything you could try on her on for depression, she mentioned Lexapro. She states that she is laying around a lot and cries all the time. I told her you probably would want to see her. Please advise. Thanks.

## 2013-08-11 NOTE — Telephone Encounter (Signed)
Chart reviewed.  She reported tolerating zoloft in past, but when retried had allergic reaction with tongue/lip swelling (08/2012). She then tried Wellbutrin (09/2012) which caused abdominal pain.  She then asked to retry Prozac, which she hadn't tolerated in the past, but she asked to be put on lowest dose, and she, again, did not tolerate it.  This is yet another SSRI, that claims she has been on in the past, yet I'm guessing she had a reaction since she wasn't ever put back on it, or remain on it.  I'm happy to appease her, but I expect there to be an intolerance (if not some other kind of reaction).  This medication will take at least 1-2 weeks to see improvement, so it is very important that she continue to see Almyra Free for counseling in the interim.  If she does not tolerate this medication (or if it isn't effective), then she really must see the psychiatrist as recommended.  The lowest dose is 5 mg.  Start at 1/2 tablet for 4-7 days, increasing to full tablet only if tolerating med completely.  This is very low dose, and if ineffective, but no side effects, we can consider further titrating up dose, but must give this some time.

## 2013-08-19 ENCOUNTER — Ambulatory Visit (INDEPENDENT_AMBULATORY_CARE_PROVIDER_SITE_OTHER): Payer: Medicare Other | Admitting: Medical

## 2013-08-19 ENCOUNTER — Encounter: Payer: Self-pay | Admitting: Medical

## 2013-08-19 VITALS — BP 130/78 | HR 60 | Temp 97.7°F | Resp 16 | Wt 119.0 lb

## 2013-08-19 DIAGNOSIS — R519 Headache, unspecified: Secondary | ICD-10-CM

## 2013-08-19 DIAGNOSIS — K089 Disorder of teeth and supporting structures, unspecified: Secondary | ICD-10-CM | POA: Diagnosis not present

## 2013-08-19 DIAGNOSIS — K0889 Other specified disorders of teeth and supporting structures: Secondary | ICD-10-CM

## 2013-08-19 DIAGNOSIS — R21 Rash and other nonspecific skin eruption: Secondary | ICD-10-CM

## 2013-08-19 DIAGNOSIS — R51 Headache: Secondary | ICD-10-CM

## 2013-08-19 NOTE — Progress Notes (Signed)
  Subjective:   Tracy Malone is a 80 y.o. female who presents for possible shingles .  Sees Dr. Tomi Bamberger here.  She is here with her husband today. She does note hx/o shingles in past, was inside her mouth on the right side.   This past week has been having some pain in her right cheek as well as tooth pain and some bleeding when she brushes her teeth. However in the last day or so she feels a prickly somewhat like pins and needles sensation of her face, jaw, chest, arms. She has been having some muscle aches as well, saw her chiropractor recently for the same symptoms. However after thinking about the cheek pain she was worried that the symptoms may represent shingles. She currently has no rash other than some pink coloration of her neck which feels irritated.  She denies itching, no fever, no chills, no body aches, no recent illness. She denies rash of the right face, inside her mouth or on her scalp.  Denies any irritation of the right eye. No other URI symptoms.   She went to her dentist today to have her tooth issue looked at, however her dentist would not do anything until she came into see [place] to rule out shingles.  Her main complaint today is the sensation in her skin in general and isolated pain of her right cheek.  She does have numerous drug allergies, and was started on a medication for her mood recently by Dr. Tomi Bamberger here. No other aggravating or relieving factors. No other complaint.  The following portions of the patient's history were reviewed and updated as appropriate: allergies, current medications, past family history, past medical history, past social history and problem list.  Review of Systems As in subjective above   Objective:   Gen: wd, wn, nad Skin: Anterior neck with faint pink coloration, mildly superficially swollen appearance of the skin such as a broad whealed appearance, otherwise no other rash HEENT: Unremarkable Oral: There is gingival inflammation in the right upper gum  line, no obvious abscess or decay, otherwise MMM, no lesions Neck: Supple, nontender, no mass,no lymphadenopathy, no thyromegaly Musculoskeletal: Nontender in general Neuro: CN 2-12 intact, nonfocal exam Pulses normal Extremities without edema   Assessment:   Encounter Diagnoses  Name Primary?  . Pain of cheek Yes  . Rash and nonspecific skin eruption   . Tooth pain      Plan:   We discussed her symptoms and concerns today. There is no obvious shingles outbreak. The only abnormal skin finding was mild pink/inflamed appearance of her anterior neck, this is faint, and the area does look a little swollen superficially. She does have some gingival disease in the right upper gum line. Otherwise exam findings unremarkable. At this point I recommended she use a few days of Benadryl over-the-counter, hydrate well.  Advise if any rash outbreaks on her face in the next few days to come back in. Advise if any new or different symptoms to call. Her skin sensation may be more related to myalgias. At this point we will use a watch and wait approach. She will go ahead and followup with dentist after a few days.

## 2013-08-20 ENCOUNTER — Encounter: Payer: Self-pay | Admitting: Family Medicine

## 2013-08-24 ENCOUNTER — Encounter: Payer: Self-pay | Admitting: Family Medicine

## 2013-08-27 ENCOUNTER — Ambulatory Visit (INDEPENDENT_AMBULATORY_CARE_PROVIDER_SITE_OTHER): Payer: Medicare Other | Admitting: Licensed Clinical Social Worker

## 2013-08-27 DIAGNOSIS — F411 Generalized anxiety disorder: Secondary | ICD-10-CM | POA: Diagnosis not present

## 2013-09-10 ENCOUNTER — Ambulatory Visit: Payer: Medicare Other | Admitting: Licensed Clinical Social Worker

## 2013-09-17 ENCOUNTER — Other Ambulatory Visit: Payer: Self-pay | Admitting: Family Medicine

## 2013-09-17 NOTE — Telephone Encounter (Signed)
Is this okay to refill? 

## 2013-09-17 NOTE — Telephone Encounter (Signed)
Ok for #30, no refill 

## 2013-09-22 ENCOUNTER — Other Ambulatory Visit: Payer: Self-pay | Admitting: Obstetrics and Gynecology

## 2013-09-22 DIAGNOSIS — N6009 Solitary cyst of unspecified breast: Secondary | ICD-10-CM

## 2013-09-24 ENCOUNTER — Ambulatory Visit (INDEPENDENT_AMBULATORY_CARE_PROVIDER_SITE_OTHER): Payer: Medicare Other | Admitting: Licensed Clinical Social Worker

## 2013-09-24 DIAGNOSIS — F411 Generalized anxiety disorder: Secondary | ICD-10-CM | POA: Diagnosis not present

## 2013-09-26 DIAGNOSIS — L821 Other seborrheic keratosis: Secondary | ICD-10-CM | POA: Diagnosis not present

## 2013-09-26 DIAGNOSIS — D235 Other benign neoplasm of skin of trunk: Secondary | ICD-10-CM | POA: Diagnosis not present

## 2013-09-26 DIAGNOSIS — L851 Acquired keratosis [keratoderma] palmaris et plantaris: Secondary | ICD-10-CM | POA: Diagnosis not present

## 2013-09-29 ENCOUNTER — Ambulatory Visit (INDEPENDENT_AMBULATORY_CARE_PROVIDER_SITE_OTHER): Payer: Medicare Other | Admitting: Family Medicine

## 2013-09-29 ENCOUNTER — Other Ambulatory Visit: Payer: Self-pay | Admitting: Family Medicine

## 2013-09-29 ENCOUNTER — Encounter: Payer: Self-pay | Admitting: Family Medicine

## 2013-09-29 VITALS — BP 98/60 | HR 48 | Ht 59.0 in | Wt 116.0 lb

## 2013-09-29 DIAGNOSIS — E039 Hypothyroidism, unspecified: Secondary | ICD-10-CM | POA: Diagnosis not present

## 2013-09-29 DIAGNOSIS — Z0000 Encounter for general adult medical examination without abnormal findings: Secondary | ICD-10-CM | POA: Diagnosis not present

## 2013-09-29 DIAGNOSIS — Z131 Encounter for screening for diabetes mellitus: Secondary | ICD-10-CM

## 2013-09-29 DIAGNOSIS — F411 Generalized anxiety disorder: Secondary | ICD-10-CM

## 2013-09-29 DIAGNOSIS — R5383 Other fatigue: Secondary | ICD-10-CM | POA: Diagnosis not present

## 2013-09-29 DIAGNOSIS — K219 Gastro-esophageal reflux disease without esophagitis: Secondary | ICD-10-CM

## 2013-09-29 DIAGNOSIS — I493 Ventricular premature depolarization: Secondary | ICD-10-CM

## 2013-09-29 DIAGNOSIS — M899 Disorder of bone, unspecified: Secondary | ICD-10-CM

## 2013-09-29 DIAGNOSIS — Z23 Encounter for immunization: Secondary | ICD-10-CM

## 2013-09-29 DIAGNOSIS — F329 Major depressive disorder, single episode, unspecified: Secondary | ICD-10-CM

## 2013-09-29 DIAGNOSIS — K589 Irritable bowel syndrome without diarrhea: Secondary | ICD-10-CM

## 2013-09-29 DIAGNOSIS — F3289 Other specified depressive episodes: Secondary | ICD-10-CM

## 2013-09-29 DIAGNOSIS — M949 Disorder of cartilage, unspecified: Secondary | ICD-10-CM | POA: Diagnosis not present

## 2013-09-29 DIAGNOSIS — I4949 Other premature depolarization: Secondary | ICD-10-CM

## 2013-09-29 DIAGNOSIS — R5381 Other malaise: Secondary | ICD-10-CM | POA: Diagnosis not present

## 2013-09-29 DIAGNOSIS — M8580 Other specified disorders of bone density and structure, unspecified site: Secondary | ICD-10-CM

## 2013-09-29 LAB — TSH: TSH: 3.378 u[IU]/mL (ref 0.350–4.500)

## 2013-09-29 LAB — GLUCOSE, RANDOM: GLUCOSE: 92 mg/dL (ref 70–99)

## 2013-09-29 NOTE — Patient Instructions (Signed)
HEALTH MAINTENANCE RECOMMENDATIONS:  It is recommended that you get at least 30 minutes of aerobic exercise at least 5 days/week (for weight loss, you may need as much as 60-90 minutes). This can be any activity that gets your heart rate up. This can be divided in 10-15 minute intervals if needed, but try and build up your endurance at least once a week.  Weight bearing exercise is also recommended twice weekly.  Eat a healthy diet with lots of vegetables, fruits and fiber.  "Colorful" foods have a lot of vitamins (ie green vegetables, tomatoes, red peppers, etc).  Limit sweet tea, regular sodas and alcoholic beverages, all of which has a lot of calories and sugar.  Up to 1 alcoholic drink daily may be beneficial for women (unless trying to lose weight, watch sugars).  Drink a lot of water.  Calcium recommendations are 1200-1500 mg daily (1500 mg for postmenopausal women or women without ovaries), and vitamin D 1000 IU daily.  This should be obtained from diet and/or supplements (vitamins), and calcium should not be taken all at once, but in divided doses.  Monthly self breast exams and yearly mammograms for women over the age of 40 is recommended.  Sunscreen of at least SPF 30 should be used on all sun-exposed parts of the skin when outside between the hours of 10 am and 4 pm (not just when at beach or pool, but even with exercise, golf, tennis, and yard work!)  Use a sunscreen that says "broad spectrum" so it covers both UVA and UVB rays, and make sure to reapply every 1-2 hours.  Remember to change the batteries in your smoke detectors when changing your clock times in the spring and fall.  Use your seat belt every time you are in a car, and please drive safely and not be distracted with cell phones and texting while driving.    

## 2013-09-29 NOTE — Progress Notes (Signed)
Chief Complaint  Patient presents with  . Med check plus    nonfasting med check plus(blood was drawn this am). Does not need gyn care sees Dr.Grewal and is UTD.    Tracy Malone is a 80 y.o. female who presents for a routine med check, as well as Annual Wellness Exam. She sees Dr. Helane Rima for her GYN care.    Depression/anxiety:  She has been seeing Marya Amsler for counseling.  She is tolerating 1/4 tablet of lexapro $RemoveBe'5mg'[id number]$  without side effects, and feels like this is effective in controlling her moods.  Feels less depressed, and she usually gets anxious with her depression.  Doing much better.  She needs to take 1/2 tablet of xanax every night in order to sleep, but not needing it for anxiety.  GERD:  Only having symptoms every couple of weeks, being careful with her diet.  Omeprazole is effective in treating symptoms prn.  IBS:  Stomach has been better the last 2 weeks.  She is careful with what she eats.  She has seen Dr. Benson Norway.  He does not feel that she needs a colonoscopy, not unless she develops changes in bowels, bleeding/anemia.   Hypothyroidism:  She is having dry skin, and feels like her hair is thinning.  Energy isn't as good as she feels it should be, but better than in the past.  She had gained some weight through November, but is back to her typical weight.  Mild carotid artery disease--intolerant of a statin in the past, and had been unwilling to try additional statins.  Recheck of u/s in 2013 was stable, minimal disease. Denies neuro symptoms--no headaches, dizziness, numbness, tingling. Memory problems, speech, concerns.  AWV: Other doctors caring for this patient include:  GYN:  Dr. Helane Rima Rheum:  Dr. Trudie Reed Ophtho: Dr. Satira Sark Dentist:  Dr. Sandria Bales GI: Dr. Benson Norway Cardiology:  Dr. Johnsie Cancel Derm: Dr. Derrel Nip  End of Life: She has a healthcare power of attorney and living will.  Depression screening: + (low energy, decreased appetite, some helplessness.  No SI)--on treatment  and getting counseling.  See scanned sheet  ADL screen:  Completely independent.  Occasional ringing in ears. She had 1 fall 6-7 months ago--she missed a step coming down the stairs.  Needs to hold onto something when putting her pants on.  See scanned sheet   Immunization History  Administered Date(s) Administered  . Influenza, High Dose Seasonal PF 06/13/2013  Tdap 09/23/2009 at [facility], per pt (no records in chart) Last pneumonia vaccine was many years ago (possibly at 18) Declines shingles vaccine (has had shingles in past, afraid of shot) Last Pap smear: last year with Dr. Helane Rima Last mammogram: 03/2013, scheduled for 6 month f/u next week Last colonoscopy: 2006 (and Dr. Benson Norway states isn't needed now) Last DEXA: due; done through Dr. Trudie Reed Carotid u/s 07/2011 (no significant stenosis, stable/unchanged) Dentist: twice yearly Ophtho: yearly Exercise:  [employer]--walking and yoga 3x/week.  No weight-bearing exercise.  Past Medical History  Diagnosis Date  . Depression     treated in the past for years;stopped in 2010 for a years  . Panic attack   . Claustrophobia   . Fibromyalgia   . IBS (irritable bowel syndrome)     Dr. Benson Norway  . Hypercholesteremia     with high HDL  . Glaucoma, narrow-angle     s/p laser surgery  . Duodenal ulcer 1962    h/o  . Hyperthyroidism     h/o x 2 years  . Shingles 1999  h/o  . Hypothyroid 9/08  . GERD (gastroesophageal reflux disease)   . Trochanteric bursitis 12/2008    bilateral  . Superficial thrombophlebitis 03/2009    RLE  . Bell's palsy 1966  . Osteoporosis 10/11    Dr.Hawkes  . SLE (systemic lupus erythematosus)     with arthralgia/myalgia, positive ANA centromere pattern, positive DNA, positive RNP, indeterminant anti-cardiolipin antibody-Dr.Hawkes.  . Carotid artery disease 2010    on vascular screening;unchanged 2013.(could not tolerate simvastatin, no other statins tried)--<30% blockage bilat 07/2011  . Recurrent UTI     has  cystocele-Dr.Grewal  . Chronic fatigue and malaise   . Chronic abdominal pain   . 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 Tonsillectomy  1946  . Vaginal hysterectomy  1971    and bladder repair.  Still has ovaries  . Cataract extraction, bilateral  1995, 1996  . Thyroidectomy, partial  09/2005    L nodule; Dr. Harlow Asa  . Upper gi endoscopy  06/27/12  . Flexible sigmoidoscopy      History   Social History  . Marital Status: Married    Spouse Name: N/A    Number of Children: 2  . Years of Education: N/A   Occupational History  . retired (school system)    Social History Main Topics  . Smoking status: Never Smoker   . Smokeless tobacco: Never Used  . Alcohol Use: No  . Drug Use: No  . Sexual Activity: Not on file   Other Topics Concern  . Not on file   Social History Narrative   Married.  Son lives in [place]; Daughter [name] lives in [place]; 2 grandchildren    Family History  Problem Relation Age of Onset  . Heart disease Mother   . Hypertension Mother   . Hypertension Sister   . Diabetes Maternal Grandfather   . Heart disease Brother   . Cancer Brother     lung   Outpatient Encounter Prescriptions as of 09/29/2013  Medication Sig Note  . ALPRAZolam (XANAX) 0.5 MG tablet TAKE 1/2 TO 1 TABLET BY MOUTH AS NEEDED FOR SLEEP OR ANXIETY 09/29/2013: Takes 1/2 tablet every night  . ARTIFICIAL TEAR OP Apply 1-2 drops to eye 4 (four) times daily as needed (dry eyes).    Marland Kitchen b complex vitamins capsule Take 1 capsule by mouth 2 (two) times daily.    . cholecalciferol (VITAMIN D) 1000 UNITS tablet Take 1,000 Units by mouth 2 (two) times daily.    Marland Kitchen escitalopram (LEXAPRO) 5 MG tablet START AT 1/2 TABLET FOR 4-7 DAYS, THEN INCREASE TO FULL TABLET IF TOLERATED 09/29/2013: Taking 1/4 tablet without side effects  . levothyroxine (SYNTHROID) 50 MCG tablet Take 25-50 mcg by mouth daily before  breakfast. 25 mcg half tablet Mon, Wed, Fri, 50 mcg full tablet on Tues, Thurs ,Sat and Sun   . omeprazole (PRILOSEC) 20 MG capsule Take 20 mg by mouth daily as needed (heartburn). 09/29/2013: Uses prn--once every 2 weeks, depending on what she eats  . vitamin E 400 UNIT capsule Take 400 Units by mouth every Monday, Wednesday, and Friday. 3 times weekly   . [DISCONTINUED] escitalopram (LEXAPRO) 5 MG tablet Start at 1/2 tablet for 4-7 days, then increase to full tablet if tolerated     Allergies  Allergen Reactions  . Salmon [Fish Allergy] Hives and Shortness Of Breath  . Shellfish Allergy Anaphylaxis  .  Aspirin Other (See Comments)    Sever stomach pain due to ulcer scaring.  . Ciprofloxacin Diarrhea  . Codeine Nausea And Vomiting  . Darvocet [Propoxyphene N-Acetaminophen] Nausea And Vomiting  . Demerol [Meperidine] Nausea And Vomiting    Violent vomitting  . Diphedryl [Diphenhydramine] Other (See Comments)    Increased pulse/small amount ok  . Doxycycline Hyclate Other (See Comments)    GI intolerance.  Marland Kitchen Epinephrine Other (See Comments)    Breathing problems  . Erythromycin Other (See Comments)    GI intolerance.  Yvette Rack [Cyclobenzaprine] Other (See Comments)    Tingly/prickly sensation.  . Iodine   . Keflex [Cephalexin] Hives  . Latex Other (See Comments)    Gloves ok  . Prednisone Other (See Comments)    Headache  . Pylera [Bis Subcit-Metronid-Tetracyc] Swelling    Tongue swelling. Face tingling  . Sulfa Antibiotics Other (See Comments)    Increased pulse, fainting, diarrhea, thrush  . Xylocaine [Lidocaine Hcl]     With epinephrine, given by dentist.  Speeded up heart rate and she passed out (occured twice, at dentist)  . Zoloft [Sertraline Hcl] Swelling and Other (See Comments)    Migraine Swelling of tongue/lip (09/2012)  . Advil [Ibuprofen] Rash    Motrin ok with a GI effect.  . Penicillins Hives and Rash   ROS:  The patient denies anorexia (fair appetite),  fever, weight changes, headaches,  vision changes, decreased hearing, ear pain, sore throat, breast concerns, chest pain, dizziness, syncope, dyspnea on exertion, cough, swelling, nausea, vomiting, diarrhea, constipation, abdominal pain, melena, hematochezia, hematuria, incontinence, dysuria, vaginal bleeding, discharge, odor or itch, genital lesions, numbness, tingling, weakness, tremor, suspicious skin lesions, abnormal bleeding/bruising, or enlarged lymph nodes. Aware of PVC's when lying down, otherwise no palpitations. Occasional heartburn Intermittent diarrhea related to IBS (and then gets constipated after taking imodium prn) Occasional hip pain, especially when lying down at night. Fibromyalgia--currently having some shoulder pain/upper back, and slight in lower back.  PHYSICAL EXAM:  BP 98/60  Pulse 48  Ht _0  (1.499 m)  Wt 116 lb (52.617 kg)  BMI 23.42 kg/m2 Pulse 74 on repeat by MD  General Appearance:    Alert, cooperative, no distress, appears stated age  Head:    Normocephalic, without obvious abnormality, atraumatic  Eyes:    PERRL, conjunctiva/corneas clear, EOM's intact, fundi    benign  Ears:    Normal TM's and external ear canals  Nose:   Nares normal, mucosa normal, no drainage or sinus   tenderness  Throat:   Lips, mucosa, and tongue normal; teeth and gums normal  Neck:   Supple, no lymphadenopathy;  thyroid:  no   enlargement/tenderness/nodules; no carotid   bruit or JVD  Back:    Spine nontender, no curvature, ROM normal, no CVA     tenderness  Lungs:     Clear to auscultation bilaterally without wheezes, rales or     ronchi; respirations unlabored  Chest Wall:    No tenderness or deformity   Heart:    Regular rate and rhythm, S1 and S2 normal, no murmur, rub   or gallop; there is an ectopic/extra beat after every 3rd beat (that was missed by nurse on checking pulse initially)   Breast Exam:    Deferred to GYN  Abdomen:     Soft, non-tender, nondistended,  normoactive bowel sounds,    no masses, no hepatosplenomegaly  Genitalia:    Deferred to GYN     Extremities:  No clubbing, cyanosis or edema  Pulses:   2+ and symmetric all extremities  Skin:   Skin color, texture, turgor normal. Skin on arms mildly dry. No rashes/lesions. She has a healing blistered area on her R cheek (recently treated with cryotherapy by derm).  No evidence of infection  Lymph nodes:   Cervical, supraclavicular, and axillary nodes normal  Neurologic:   CNII-XII intact, normal strength, sensation and gait; reflexes 2+ and symmetric throughout          Psych:   Normal mood, affect, hygiene and grooming.     ASSESSMENT/PLAN:  Medicare annual wellness visit, initial  Anxiety state, unspecified - stable/improved  Unspecified hypothyroidism - Plan: TSH  Depressive disorder, not elsewhere classified - stable/improved. continue counseling and low dose lexapro  Osteopenia - monitored per Dr. Trudie Reed.  encouraged calcium, vitamin D and weight-bearing exercise  GERD (gastroesophageal reflux disease) - stable.  continue careful det and prn PPI  Screening for diabetes mellitus - Plan: Glucose, random  Need for pneumococcal vaccine - Plan: Pneumococcal conjugate vaccine 13-valent  IBS (irritable bowel syndrome) - stable  PVC's (premature ventricular contractions) - fairly asymptomatic.  reassured of low pulses when checked by nurses--inaccurate. (being told of low pulses cause her more anxiety, and is really normal)  Discussed monthly self breast exams and yearly mammograms after the age of 53; at least 30 minutes of aerobic activity at least 5 days/week; proper sunscreen use reviewed; healthy diet, including goals of calcium and vitamin D intake and alcohol recommendations (less than or equal to 1 drink/day) reviewed; regular seatbelt use; changing batteries in smoke detectors.  Immunization recommendations discussed--prevnar 13 today.  Will try and confirm Tdap date, but  seems UTD.  Declines zostavax.  Colonoscopy recommendations reviewed--may elect to hold off based on age, per Dr. Benson Norway.  Recommended if significant change in bowels, bloody stools, iron deficiency anemia.  TSH, glucose (c-met done x 2 in December and was normal) CBC--normal in December  Lab Results  Component Value Date   CHOL 207* 01/01/2013   HDL 64 01/01/2013   LDLCALC 114* 01/01/2013   TRIG 146 01/01/2013   CHOLHDL 3.2 01/01/2013   Given that there wasn't any significant carotid blockage, and good HDL, and age and intolerance to statin in past, no treatment is recommended.  Osteoporosis--monitored by Dr. Trudie Reed.  Due for DEXA. Not on treatment.  Recommended weight-bearing exercise.  Had a normal Vitamin D in 04/2012, and continues to take daily supplement.  Depression--doing well.  Continue counseling and very low dose lexapro (refilled to her pharmacy earlier today).   AWV:  End of life discussion.  Pt will get [place] copies of her living will, POA.  MOST form filled out today.  Full Code, Full Care

## 2013-09-30 ENCOUNTER — Encounter: Payer: Self-pay | Admitting: Family Medicine

## 2013-09-30 DIAGNOSIS — K589 Irritable bowel syndrome without diarrhea: Secondary | ICD-10-CM | POA: Insufficient documentation

## 2013-10-06 ENCOUNTER — Ambulatory Visit
Admission: RE | Admit: 2013-10-06 | Discharge: 2013-10-06 | Disposition: A | Discharge status: Home / Self Care | Payer: Medicare Other | Source: Ambulatory Visit | Attending: Obstetrics and Gynecology | Admitting: Obstetrics and Gynecology

## 2013-10-06 DIAGNOSIS — N6009 Solitary cyst of unspecified breast: Secondary | ICD-10-CM

## 2013-10-06 DIAGNOSIS — R928 Other abnormal and inconclusive findings on diagnostic imaging of breast: Secondary | ICD-10-CM | POA: Diagnosis not present

## 2013-10-08 ENCOUNTER — Ambulatory Visit (INDEPENDENT_AMBULATORY_CARE_PROVIDER_SITE_OTHER): Payer: Medicare Other | Admitting: Licensed Clinical Social Worker

## 2013-10-08 DIAGNOSIS — F411 Generalized anxiety disorder: Secondary | ICD-10-CM

## 2013-10-09 ENCOUNTER — Telehealth: Payer: Self-pay | Admitting: *Deleted

## 2013-10-09 NOTE — Telephone Encounter (Signed)
Patient called and states that she takes her Synthroid first thing in the morning on an empty stomach, wants to know how long she has to wait until she can take her Lexapro after that. Please advise. Thanks.

## 2013-10-09 NOTE — Telephone Encounter (Signed)
Advise--30-60 minutes (ideally 60, but as long as she is consistent with following the same routine, it isn't that critical--the consistency is important)

## 2013-10-09 NOTE — Telephone Encounter (Signed)
Patient advised.

## 2013-10-24 ENCOUNTER — Ambulatory Visit (INDEPENDENT_AMBULATORY_CARE_PROVIDER_SITE_OTHER): Payer: Medicare Other | Admitting: Licensed Clinical Social Worker

## 2013-10-24 DIAGNOSIS — F411 Generalized anxiety disorder: Secondary | ICD-10-CM | POA: Diagnosis not present

## 2013-10-27 ENCOUNTER — Encounter: Payer: Self-pay | Admitting: Family Medicine

## 2013-10-28 ENCOUNTER — Other Ambulatory Visit (INDEPENDENT_AMBULATORY_CARE_PROVIDER_SITE_OTHER): Payer: Medicare Other

## 2013-10-28 DIAGNOSIS — D62 Acute posthemorrhagic anemia: Secondary | ICD-10-CM

## 2013-10-28 DIAGNOSIS — Z1211 Encounter for screening for malignant neoplasm of colon: Secondary | ICD-10-CM | POA: Diagnosis not present

## 2013-10-29 ENCOUNTER — Ambulatory Visit (INDEPENDENT_AMBULATORY_CARE_PROVIDER_SITE_OTHER): Payer: Medicare Other | Admitting: Family Medicine

## 2013-10-29 ENCOUNTER — Encounter: Payer: Self-pay | Admitting: Family Medicine

## 2013-10-29 VITALS — BP 118/72 | HR 56 | Temp 99.2°F | Ht 59.0 in | Wt 119.0 lb

## 2013-10-29 DIAGNOSIS — M545 Low back pain, unspecified: Secondary | ICD-10-CM

## 2013-10-29 DIAGNOSIS — N390 Urinary tract infection, site not specified: Secondary | ICD-10-CM | POA: Diagnosis not present

## 2013-10-29 LAB — HEMOCCULT GUIAC POC 1CARD (OFFICE)
Card #2 Fecal Occult Blod, POC: NEGATIVE
Card #3 Fecal Occult Blood, POC: NEGATIVE
Fecal Occult Blood, POC: NEGATIVE

## 2013-10-29 LAB — POCT URINALYSIS DIPSTICK
BILIRUBIN UA: NEGATIVE
Glucose, UA: NEGATIVE
Ketones, UA: NEGATIVE
NITRITE UA: NEGATIVE
PH UA: 5
PROTEIN UA: NEGATIVE
Spec Grav, UA: 1.015
UROBILINOGEN UA: NEGATIVE

## 2013-10-29 MED ORDER — NITROFURANTOIN MONOHYD MACRO 100 MG PO CAPS: 100.0000 mg | ORAL_CAPSULE | Freq: Two times a day (BID) | ORAL | Status: DC

## 2013-10-29 NOTE — Patient Instructions (Signed)
Start the antibiotics for urinary tract infection today.  Culture will take a few days to come back.  We will notify you when available, and change the antibiotic if it shows that the current one is not effective.  Continue to drink plenty of fluids.  Return if you develop high fevers, chills, vomiting, flank/kidney pain.

## 2013-10-29 NOTE — Progress Notes (Signed)
Chief Complaint  Patient presents with  . Groin Pain    and lbp, thinks she may have UTI.   She is having lower abdominal pain/pressure, which radiates around to her lower back.  She has had symptoms off and on for 2.5-3 weeks.  She voids frequently, but drinks a lot of water.  She thinks she is voiding a little more than it normal for her, especially at night, getting up twice at night.  Denies any pain with urination.    Last UTI was over a year ago.  Symptoms are typically just lower abdominal pressure and back pain.  She hasn't been having fever or chills, but feeling "punky"--tired, and cold.  She took some home urinary tests that showed positive for UTI.  She has multiple drug allergies, which were reviewed today.  She recalls taking macrobid in the past--no recollection of significant problem, no allergy documented to it.  The reaction to cipro is diarrhea, so an intolerance, not an allergy.  She has known prolapse of her bladder, and Dr. Helane Rima has recommended a pessary.  She plans to follow up with her soon.  Past Medical History  Diagnosis Date  . Depression     treated in the past for years;stopped in 2010 for a years  . Panic attack   . Claustrophobia   . Fibromyalgia   . IBS (irritable bowel syndrome)     Dr. Benson Norway  . Hypercholesteremia     with high HDL  . Glaucoma, narrow-angle     s/p laser surgery  . Duodenal ulcer 1962    h/o  . Hyperthyroidism     h/o x 2 years  . Shingles 1999    h/o  . Hypothyroid 9/08  . GERD (gastroesophageal reflux disease)   . Trochanteric bursitis 12/2008    bilateral  . Superficial thrombophlebitis 03/2009    RLE  . Bell's palsy 1966  . Osteoporosis 10/11    Dr.Hawkes  . SLE (systemic lupus erythematosus)     with arthralgia/myalgia, positive ANA centromere pattern, positive DNA, positive RNP, indeterminant anti-cardiolipin antibody-Dr.Hawkes.  . Carotid artery disease 2010    on vascular screening;unchanged 2013.(could not tolerate  simvastatin, no other statins tried)--<30% blockage bilat 07/2011  . Recurrent UTI     has cystocele-Dr.Grewal  . Chronic fatigue and malaise   . Chronic abdominal pain   . 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 Tonsillectomy  1946  . Vaginal hysterectomy  1971    and bladder repair.  Still has ovaries  . Cataract extraction, bilateral  1995, 1996  . Thyroidectomy, partial  09/2005    L nodule; Dr. Harlow Asa  . Upper gi endoscopy  06/27/12  . Flexible sigmoidoscopy     History   Social History  . Marital Status: Married    Spouse Name: N/A    Number of Children: 2  . Years of Education: N/A   Occupational History  . retired (school system)    Social History Main Topics  . Smoking status: Never Smoker   . Smokeless tobacco: Never Used  . Alcohol Use: No  . Drug Use: No  . Sexual Activity: Not on file   Other Topics Concern  . Not on file   Social History Narrative   Married.  Son lives in [place]; Daughter [name] lives in [place]; 2 grandchildren   Outpatient Encounter Prescriptions as of 10/29/2013  Medication Sig Note  . ALPRAZolam (XANAX) 0.5 MG tablet TAKE 1/2 TO 1 TABLET BY MOUTH AS NEEDED FOR SLEEP OR ANXIETY 09/29/2013: Takes 1/2 tablet every night  . ARTIFICIAL TEAR OP Apply 1-2 drops to eye 4 (four) times daily as needed (dry eyes).    Marland Kitchen b complex vitamins capsule Take 1 capsule by mouth 2 (two) times daily.    . cholecalciferol (VITAMIN D) 1000 UNITS tablet Take 1,000 Units by mouth 2 (two) times daily.    Marland Kitchen escitalopram (LEXAPRO) 5 MG tablet START AT 1/2 TABLET FOR 4-7 DAYS, THEN INCREASE TO FULL TABLET IF TOLERATED 09/29/2013: Taking 1/4 tablet without side effects  . levothyroxine (SYNTHROID) 50 MCG tablet Take 25-50 mcg by mouth daily before breakfast. 25 mcg half tablet Mon, Wed, Fri, 50 mcg full tablet on Tues, Thurs ,Sat and Sun   . omeprazole (PRILOSEC) 20 MG capsule Take  20 mg by mouth daily as needed (heartburn). 09/29/2013: Uses prn--once every 2 weeks, depending on what she eats  . vitamin E 400 UNIT capsule Take 400 Units by mouth every Monday, Wednesday, and Friday. 3 times weekly   . nitrofurantoin, macrocrystal-monohydrate, (MACROBID) 100 MG capsule Take 1 capsule (100 mg total) by mouth 2 (two) times daily.   (macrobid was added today, not taking prior to visit)  Allergies  Allergen Reactions  . Salmon [Fish Allergy] Hives and Shortness Of Breath  . Shellfish Allergy Anaphylaxis  . Aspirin Other (See Comments)    Sever stomach pain due to ulcer scaring.  . Ciprofloxacin Diarrhea  . Codeine Nausea And Vomiting  . Darvocet [Propoxyphene N-Acetaminophen] Nausea And Vomiting  . Demerol [Meperidine] Nausea And Vomiting    Violent vomitting  . Diphedryl [Diphenhydramine] Other (See Comments)    Increased pulse/small amount ok  . Doxycycline Hyclate Other (See Comments)    GI intolerance.  Marland Kitchen Epinephrine Other (See Comments)    Breathing problems  . Erythromycin Other (See Comments)    GI intolerance.  Yvette Rack [Cyclobenzaprine] Other (See Comments)    Tingly/prickly sensation.  . Iodine   . Keflex [Cephalexin] Hives  . Latex Other (See Comments)    Gloves ok  . Prednisone Other (See Comments)    Headache  . Pylera [Bis Subcit-Metronid-Tetracyc] Swelling    Tongue swelling. Face tingling  . Sulfa Antibiotics Other (See Comments)    Increased pulse, fainting, diarrhea, thrush  . Xylocaine [Lidocaine Hcl]     With epinephrine, given by dentist.  Speeded up heart rate and she passed out (occured twice, at dentist)  . Zoloft [Sertraline Hcl] Swelling and Other (See Comments)    Migraine Swelling of tongue/lip (09/2012)  . Advil [Ibuprofen] Rash    Motrin ok with a GI effect.  . Penicillins Hives and Rash   ROS:  Denies fevers, chills, nausea, vomiting, flank pain, vaginal discharge, bleeding, bruising, URI symptoms, chest pain, shortness of  breath.  Moods stable, weight stable.  PHYSICAL EXAM: BP 118/72  Pulse 56  Temp(Src) 99.2 F (37.3 C) (Oral)  Ht 4\' 11"  (1.499 m)  Wt 119 lb (53.978 kg)  BMI 24.02 kg/m2 Well developed, pleasant female, in no distress, in good spirits Back:  No CVA tenderness Abdomen: soft, nontender.  No suprapubic tenderness or masses  Urine:  Moderate leuks, trace blood  ASSESSMENT/PLAN:  Urinary tract infection, site not specified - Plan: nitrofurantoin, macrocrystal-monohydrate, (MACROBID) 100 MG capsule, Urine culture  LBP (low back pain) - Plan: POCT Urinalysis Dipstick   Treat with macrobid x  7 days.  Send urine for culture.  If abx needs changing, likely can tolerate either low dose cipro (250mg , along with probiotic) vs trial of levaquin.

## 2013-11-01 LAB — URINE CULTURE

## 2013-11-10 ENCOUNTER — Encounter: Payer: Self-pay | Admitting: Family Medicine

## 2013-11-17 ENCOUNTER — Ambulatory Visit: Payer: Medicare Other | Admitting: Licensed Clinical Social Worker

## 2013-11-17 DIAGNOSIS — H40039 Anatomical narrow angle, unspecified eye: Secondary | ICD-10-CM | POA: Diagnosis not present

## 2013-11-17 DIAGNOSIS — Z961 Presence of intraocular lens: Secondary | ICD-10-CM | POA: Diagnosis not present

## 2013-11-17 DIAGNOSIS — H43819 Vitreous degeneration, unspecified eye: Secondary | ICD-10-CM | POA: Diagnosis not present

## 2013-11-17 DIAGNOSIS — H52209 Unspecified astigmatism, unspecified eye: Secondary | ICD-10-CM | POA: Diagnosis not present

## 2013-11-20 ENCOUNTER — Telehealth: Payer: Self-pay

## 2013-11-20 ENCOUNTER — Other Ambulatory Visit: Payer: Self-pay | Admitting: Family Medicine

## 2013-11-20 NOTE — Telephone Encounter (Signed)
Ok for #30, no refill 

## 2013-11-20 NOTE — Telephone Encounter (Signed)
Is this okay to refill? 

## 2013-11-20 NOTE — Telephone Encounter (Signed)
error 

## 2013-11-24 ENCOUNTER — Ambulatory Visit (INDEPENDENT_AMBULATORY_CARE_PROVIDER_SITE_OTHER): Payer: Medicare Other | Admitting: Licensed Clinical Social Worker

## 2013-11-24 DIAGNOSIS — F411 Generalized anxiety disorder: Secondary | ICD-10-CM | POA: Diagnosis not present

## 2013-12-09 DIAGNOSIS — N390 Urinary tract infection, site not specified: Secondary | ICD-10-CM | POA: Diagnosis not present

## 2013-12-09 DIAGNOSIS — M329 Systemic lupus erythematosus, unspecified: Secondary | ICD-10-CM | POA: Diagnosis not present

## 2013-12-09 DIAGNOSIS — IMO0001 Reserved for inherently not codable concepts without codable children: Secondary | ICD-10-CM | POA: Diagnosis not present

## 2013-12-09 DIAGNOSIS — R3989 Other symptoms and signs involving the genitourinary system: Secondary | ICD-10-CM | POA: Diagnosis not present

## 2013-12-09 DIAGNOSIS — IMO0002 Reserved for concepts with insufficient information to code with codable children: Secondary | ICD-10-CM | POA: Diagnosis not present

## 2013-12-09 DIAGNOSIS — M810 Age-related osteoporosis without current pathological fracture: Secondary | ICD-10-CM | POA: Diagnosis not present

## 2013-12-11 ENCOUNTER — Encounter: Payer: Self-pay | Admitting: Family Medicine

## 2013-12-22 ENCOUNTER — Ambulatory Visit (INDEPENDENT_AMBULATORY_CARE_PROVIDER_SITE_OTHER): Payer: Medicare Other | Admitting: Licensed Clinical Social Worker

## 2013-12-22 DIAGNOSIS — F411 Generalized anxiety disorder: Secondary | ICD-10-CM

## 2013-12-23 DIAGNOSIS — IMO0002 Reserved for concepts with insufficient information to code with codable children: Secondary | ICD-10-CM | POA: Diagnosis not present

## 2013-12-23 DIAGNOSIS — M25569 Pain in unspecified knee: Secondary | ICD-10-CM | POA: Diagnosis not present

## 2013-12-29 ENCOUNTER — Encounter: Payer: Self-pay | Admitting: Family Medicine

## 2013-12-29 ENCOUNTER — Ambulatory Visit (INDEPENDENT_AMBULATORY_CARE_PROVIDER_SITE_OTHER): Payer: Medicare Other | Admitting: Family Medicine

## 2013-12-29 VITALS — BP 140/80 | HR 80 | Temp 97.6°F | Ht 59.0 in | Wt 117.0 lb

## 2013-12-29 DIAGNOSIS — R197 Diarrhea, unspecified: Secondary | ICD-10-CM | POA: Diagnosis not present

## 2013-12-29 DIAGNOSIS — M545 Low back pain, unspecified: Secondary | ICD-10-CM | POA: Diagnosis not present

## 2013-12-29 DIAGNOSIS — N8111 Cystocele, midline: Secondary | ICD-10-CM

## 2013-12-29 DIAGNOSIS — E039 Hypothyroidism, unspecified: Secondary | ICD-10-CM

## 2013-12-29 DIAGNOSIS — R102 Pelvic and perineal pain: Secondary | ICD-10-CM

## 2013-12-29 DIAGNOSIS — IMO0002 Reserved for concepts with insufficient information to code with codable children: Secondary | ICD-10-CM

## 2013-12-29 DIAGNOSIS — R109 Unspecified abdominal pain: Secondary | ICD-10-CM

## 2013-12-29 LAB — POCT URINALYSIS DIPSTICK
Bilirubin, UA: NEGATIVE
Glucose, UA: NEGATIVE
Ketones, UA: NEGATIVE
Nitrite, UA: NEGATIVE
PH UA: 5
PROTEIN UA: NEGATIVE
SPEC GRAV UA: 1.01
UROBILINOGEN UA: NEGATIVE

## 2013-12-29 MED ORDER — SYNTHROID 50 MCG PO TABS: 25.0000 ug | ORAL_TABLET | Freq: Every day | ORAL | Status: DC

## 2013-12-29 NOTE — Progress Notes (Signed)
Chief Complaint  Patient presents with  . Back Pain    lbp and abdominal pain since last Monday. Thinks she may have a UTI. UA showed 1+ leuks and blood.     Last UTI was 2 months ago.  Culture showed Enterococcus, treated with Macrobid. Symptoms had completely resolved.  She felt like something started when she saw Dr. Trudie Reed in May, so urine specimen was checked then and was "dirty"--no treatment given.  She thinks perhaps that symptoms have gradually worsened since then, but really noticed them last week.  She is having lower abdominal pains (sometimes it is sharp, and would press on the bowels and cause her to have frequent bowel movements--it felt swollen in her lower abdomen).  She denies any dysuria, urgency/frequency, no odor to the urine, not cloudy and no blood in the urine.  She is also having pain in the lower back over the last week (not in flanks, she does have some intermittent muscular pain higher in back, not currently).  Dr. Helane Rima told her last year that her bladder has dropped, and it sounds like they discussed a pessary, but it was never done.  Gets sharp pains about 6-7 times/day, mostly at night.  Hasn't had any yet today.  Having diarrhea on average 3-4x/day.  Stools are formed at first, then get looser.  +foul odor to stool (not urine).  She had cortisone shot into the R knee at her last visit with Dr. Trudie Reed, and the knee is doing better (gradually).  She is asking for written rx for 59mcg Synthroid brand, so she can check prices at different pharmacies.  Requesting #[phone]  Past Medical History  Diagnosis Date  . Depression     treated in the past for years;stopped in 2010 for a years  . Panic attack   . Claustrophobia   . Fibromyalgia   . IBS (irritable bowel syndrome)     Dr. Benson Norway  . Hypercholesteremia     with high HDL  . Glaucoma, narrow-angle     s/p laser surgery  . Duodenal ulcer 1962    h/o  . Hyperthyroidism     h/o x 2 years  . Shingles 1999    h/o  .  Hypothyroid 9/08  . GERD (gastroesophageal reflux disease)   . Trochanteric bursitis 12/2008    bilateral  . Superficial thrombophlebitis 03/2009    RLE  . Bell's palsy 1966  . Osteoporosis 10/11    Dr.Hawkes  . SLE (systemic lupus erythematosus)     with arthralgia/myalgia, positive ANA centromere pattern, positive DNA, positive RNP, indeterminant anti-cardiolipin antibody-Dr.Hawkes.  . Carotid artery disease 2010    on vascular screening;unchanged 2013.(could not tolerate simvastatin, no other statins tried)--<30% blockage bilat 07/2011  . Recurrent UTI     has cystocele-Dr.Grewal  . Chronic fatigue and malaise   . Chronic abdominal pain   . 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 Tonsillectomy  1946  . Vaginal hysterectomy  1971    and bladder repair.  Still has ovaries  . Cataract extraction, bilateral  1995, 1996  . Thyroidectomy, partial  09/2005    L nodule; Dr. Harlow Asa  . Upper gi endoscopy  06/27/12  . Flexible sigmoidoscopy     History   Social History  . Marital Status: Married    Spouse Name: N/A    Number of Children: 2  . Years  of Education: N/A   Occupational History  . retired (school system)    Social History Main Topics  . Smoking status: Never Smoker   . Smokeless tobacco: Never Used  . Alcohol Use: No  . Drug Use: No  . Sexual Activity: Not on file   Other Topics Concern  . Not on file   Social History Narrative   Married.  Son lives in [place]; Daughter [name] lives in [place]; 2 grandchildren   Outpatient Encounter Prescriptions as of 12/29/2013  Medication Sig Note  . ALPRAZolam (XANAX) 0.5 MG tablet TAKE 1/2 TO 1 TABLET BY MOUTH AS NEEDED FOR SLEEP OR ANXIETY   . ARTIFICIAL TEAR OP Apply 1-2 drops to eye 4 (four) times daily as needed (dry eyes).    Marland Kitchen b complex vitamins capsule Take 1 capsule by mouth 2 (two) times daily.    . cholecalciferol (VITAMIN  D) 1000 UNITS tablet Take 1,000 Units by mouth 2 (two) times daily.    Marland Kitchen escitalopram (LEXAPRO) 5 MG tablet START AT 1/2 TABLET FOR 4-7 DAYS, THEN INCREASE TO FULL TABLET IF TOLERATED 09/29/2013: Taking 1/4 tablet without side effects  . levothyroxine (SYNTHROID) 50 MCG tablet Take 25-50 mcg by mouth daily before breakfast. 25 mcg half tablet Mon, Wed, Fri, 50 mcg full tablet on Tues, Thurs ,Sat and Sun   . vitamin E 400 UNIT capsule Take 400 Units by mouth every Monday, Wednesday, and Friday. 3 times weekly   . omeprazole (PRILOSEC) 20 MG capsule Take 20 mg by mouth daily as needed (heartburn). 09/29/2013: Uses prn--once every 2 weeks, depending on what she eats  . [DISCONTINUED] nitrofurantoin, macrocrystal-monohydrate, (MACROBID) 100 MG capsule Take 1 capsule (100 mg total) by mouth 2 (two) times daily.    Allergies  Allergen Reactions  . Salmon [Fish Allergy] Hives and Shortness Of Breath  . Shellfish Allergy Anaphylaxis  . Aspirin Other (See Comments)    Sever stomach pain due to ulcer scaring.  . Ciprofloxacin Diarrhea  . Codeine Nausea And Vomiting  . Darvocet [Propoxyphene N-Acetaminophen] Nausea And Vomiting  . Demerol [Meperidine] Nausea And Vomiting    Violent vomitting  . Diphedryl [Diphenhydramine] Other (See Comments)    Increased pulse/small amount ok  . Doxycycline Hyclate Other (See Comments)    GI intolerance.  Marland Kitchen Epinephrine Other (See Comments)    Breathing problems  . Erythromycin Other (See Comments)    GI intolerance.  Yvette Rack [Cyclobenzaprine] Other (See Comments)    Tingly/prickly sensation.  . Iodine   . Keflex [Cephalexin] Hives  . Latex Other (See Comments)    Gloves ok  . Prednisone Other (See Comments)    Headache  . Pylera [Bis Subcit-Metronid-Tetracyc] Swelling    Tongue swelling. Face tingling  . Sulfa Antibiotics Other (See Comments)    Increased pulse, fainting, diarrhea, thrush  . Xylocaine [Lidocaine Hcl]     With epinephrine, given by  dentist.  Speeded up heart rate and she passed out (occured twice, at dentist)  . Zoloft [Sertraline Hcl] Swelling and Other (See Comments)    Migraine Swelling of tongue/lip (09/2012)  . Advil [Ibuprofen] Rash    Motrin ok with a GI effect.  . Penicillins Hives and Rash   ROS:  Denies fevers, chills, nausea or vomiting.  +diarrhea.  She has been perspiring when having the diarrhea.  No visible blood in the stool or urine.  Denies URI symptoms, cough, shortness of breath, chest pain, palpitations.  PHYSICAL EXAM: BP 140/80  Pulse 80  Ht 4\' 11"  (1.499 m)  Wt 117 lb (53.071 kg)  BMI 23.62 kg/m2 T97.6 Well developed, pleasant female in no distress Heart: regular rate and rhythm, no murmur or ectopy Lungs: clear bilaterally Abdomen:  No suprapubic tenderness, soft, no masses.  Normal bowel sounds Pelvic exam:  Some cystocele noted with cough. Bladder is nontender.  She has some tenderness isn LLQ/adnexal region; no masses Rectal exam: light brown, heme negative stool. No masses, normal sphincter tone   Urine dip:  1+ blood, 1+ leuks, SG 1.010  ASSESSMENT/PLAN:  Suprapubic pain - other than pain, no history to suggest infection.  given her h/o allergies to meds, will await culture results - Plan: Urine culture  LBP (low back pain) - Plan: POCT Urinalysis Dipstick  Unspecified hypothyroidism - Plan: SYNTHROID 50 MCG tablet  Diarrhea - known h/o IBS  Cystocele - f/u with GYN as scheduled   Start probiotics Send urine culture and await results given atypical symptoms and sensitivities to ABX  F/u as scheduled with Dr. Helane Rima

## 2013-12-29 NOTE — Patient Instructions (Signed)
Restart probiotics, and avoid dairy (since you have been having loose stools). We will be in touch with your culture results once they are back.  If you start having fevers, vomiting, kidney pain, please contact us right away.

## 2013-12-30 DIAGNOSIS — IMO0002 Reserved for concepts with insufficient information to code with codable children: Secondary | ICD-10-CM | POA: Insufficient documentation

## 2013-12-31 LAB — URINE CULTURE

## 2014-01-01 ENCOUNTER — Encounter: Payer: Self-pay | Admitting: Family Medicine

## 2014-01-21 ENCOUNTER — Ambulatory Visit (INDEPENDENT_AMBULATORY_CARE_PROVIDER_SITE_OTHER): Payer: Medicare Other | Admitting: Licensed Clinical Social Worker

## 2014-01-21 ENCOUNTER — Other Ambulatory Visit: Payer: Self-pay | Admitting: Family Medicine

## 2014-01-21 DIAGNOSIS — F411 Generalized anxiety disorder: Secondary | ICD-10-CM

## 2014-01-21 MED ORDER — ALPRAZOLAM 0.5 MG PO TABS: ORAL_TABLET | ORAL | Status: DC

## 2014-02-11 ENCOUNTER — Ambulatory Visit (INDEPENDENT_AMBULATORY_CARE_PROVIDER_SITE_OTHER): Payer: Medicare Other | Admitting: Licensed Clinical Social Worker

## 2014-02-11 DIAGNOSIS — F411 Generalized anxiety disorder: Secondary | ICD-10-CM

## 2014-02-13 DIAGNOSIS — IMO0002 Reserved for concepts with insufficient information to code with codable children: Secondary | ICD-10-CM | POA: Diagnosis not present

## 2014-02-13 DIAGNOSIS — M25569 Pain in unspecified knee: Secondary | ICD-10-CM | POA: Diagnosis not present

## 2014-02-24 ENCOUNTER — Ambulatory Visit (INDEPENDENT_AMBULATORY_CARE_PROVIDER_SITE_OTHER): Payer: Medicare Other | Admitting: Family Medicine

## 2014-02-24 ENCOUNTER — Encounter: Payer: Self-pay | Admitting: Family Medicine

## 2014-02-24 VITALS — BP 102/60 | HR 50 | Wt 115.0 lb

## 2014-02-24 DIAGNOSIS — I4949 Other premature depolarization: Secondary | ICD-10-CM

## 2014-02-24 DIAGNOSIS — R5382 Chronic fatigue, unspecified: Secondary | ICD-10-CM

## 2014-02-24 DIAGNOSIS — F329 Major depressive disorder, single episode, unspecified: Secondary | ICD-10-CM

## 2014-02-24 DIAGNOSIS — R5381 Other malaise: Secondary | ICD-10-CM | POA: Diagnosis not present

## 2014-02-24 DIAGNOSIS — R5383 Other fatigue: Secondary | ICD-10-CM

## 2014-02-24 DIAGNOSIS — F3289 Other specified depressive episodes: Secondary | ICD-10-CM | POA: Diagnosis not present

## 2014-02-24 DIAGNOSIS — I493 Ventricular premature depolarization: Secondary | ICD-10-CM

## 2014-02-24 LAB — CBC WITH DIFFERENTIAL/PLATELET
Basophils Absolute: 0 10*3/uL (ref 0.0–0.1)
Basophils Relative: 0 % (ref 0–1)
EOS ABS: 0.2 10*3/uL (ref 0.0–0.7)
Eosinophils Relative: 2 % (ref 0–5)
HCT: 42.2 % (ref 36.0–46.0)
Hemoglobin: 14.5 g/dL (ref 12.0–15.0)
Lymphocytes Relative: 24 % (ref 12–46)
Lymphs Abs: 2.8 10*3/uL (ref 0.7–4.0)
MCH: 30 pg (ref 26.0–34.0)
MCHC: 34.4 g/dL (ref 30.0–36.0)
MCV: 87.2 fL (ref 78.0–100.0)
MONOS PCT: 9 % (ref 3–12)
Monocytes Absolute: 1.1 10*3/uL — ABNORMAL HIGH (ref 0.1–1.0)
Neutro Abs: 7.7 10*3/uL (ref 1.7–7.7)
Neutrophils Relative %: 65 % (ref 43–77)
PLATELETS: 308 10*3/uL (ref 150–400)
RBC: 4.84 MIL/uL (ref 3.87–5.11)
RDW: 14.2 % (ref 11.5–15.5)
WBC: 11.8 10*3/uL — ABNORMAL HIGH (ref 4.0–10.5)

## 2014-02-24 LAB — COMPREHENSIVE METABOLIC PANEL
ALT: 15 U/L (ref 0–35)
AST: 17 U/L (ref 0–37)
Albumin: 3.9 g/dL (ref 3.5–5.2)
Alkaline Phosphatase: 64 U/L (ref 39–117)
BILIRUBIN TOTAL: 0.9 mg/dL (ref 0.2–1.2)
BUN: 15 mg/dL (ref 6–23)
CO2: 26 meq/L (ref 19–32)
Calcium: 9.1 mg/dL (ref 8.4–10.5)
Chloride: 103 mEq/L (ref 96–112)
Creat: 0.68 mg/dL (ref 0.50–1.10)
GLUCOSE: 119 mg/dL — AB (ref 70–99)
Potassium: 4.1 mEq/L (ref 3.5–5.3)
Sodium: 138 mEq/L (ref 135–145)
Total Protein: 6.7 g/dL (ref 6.0–8.3)

## 2014-02-24 LAB — TSH: TSH: 2.576 u[IU]/mL (ref 0.350–4.500)

## 2014-02-24 NOTE — Progress Notes (Signed)
   Subjective:    Patient ID: Tracy Malone, female    DOB: [birth year], 80 y.o.   MRN: [id number]  HPI She is here for evaluation of continued difficulty with fatigue. She dates this back to April. She did have trouble with UTI at that time but no urinary symptoms since then. She has no shortness of breath, fever, chills, nausea, vomiting, abdominal pain, chest pain. She does states she has some lower abdominal pain that is intermittent in nature. She states that she is taking 5 mg Lexapro but taking only one quarter of a pill at a time and states she is noted no improvement. She does state that the 5 mg causing sedation. She continues in counseling. She does admit to difficulty dealing with guilt as well as worrying about the future.  Review of Systems     Objective:   Physical Exam alert and in no distress. Tympanic membranes and canals are normal. Throat is clear. Tonsils are normal. Neck is supple without adenopathy or thyromegaly. Cardiac exam shows an irregular rhythm without murmurs or gallops. Lungs are clear to auscultation.        Assessment & Plan:  Chronic fatigue - Plan: CBC with Differential, Comprehensive metabolic panel, TSH  PVC's (premature ventricular contractions)  Depressive disorder, not elsewhere classified  will await blood results. Discussed the need to discuss further antidepressant medication with Dr. Tomi Bamberger when she comes back from vacation.

## 2014-02-25 ENCOUNTER — Encounter: Payer: Self-pay | Admitting: Family Medicine

## 2014-03-02 ENCOUNTER — Other Ambulatory Visit: Payer: Self-pay

## 2014-03-02 DIAGNOSIS — Z1231 Encounter for screening mammogram for malignant neoplasm of breast: Secondary | ICD-10-CM

## 2014-03-04 ENCOUNTER — Ambulatory Visit (INDEPENDENT_AMBULATORY_CARE_PROVIDER_SITE_OTHER): Payer: Medicare Other | Admitting: Licensed Clinical Social Worker

## 2014-03-04 DIAGNOSIS — F411 Generalized anxiety disorder: Secondary | ICD-10-CM

## 2014-03-05 ENCOUNTER — Encounter: Payer: Self-pay | Admitting: Family Medicine

## 2014-03-05 ENCOUNTER — Ambulatory Visit (INDEPENDENT_AMBULATORY_CARE_PROVIDER_SITE_OTHER): Payer: Medicare Other | Admitting: Family Medicine

## 2014-03-05 VITALS — BP 140/70 | HR 48 | Ht 59.0 in | Wt 115.0 lb

## 2014-03-05 DIAGNOSIS — F411 Generalized anxiety disorder: Secondary | ICD-10-CM | POA: Diagnosis not present

## 2014-03-05 DIAGNOSIS — E039 Hypothyroidism, unspecified: Secondary | ICD-10-CM

## 2014-03-05 DIAGNOSIS — F329 Major depressive disorder, single episode, unspecified: Secondary | ICD-10-CM | POA: Diagnosis not present

## 2014-03-05 DIAGNOSIS — R5383 Other fatigue: Secondary | ICD-10-CM

## 2014-03-05 DIAGNOSIS — K589 Irritable bowel syndrome without diarrhea: Secondary | ICD-10-CM

## 2014-03-05 DIAGNOSIS — F3289 Other specified depressive episodes: Secondary | ICD-10-CM

## 2014-03-05 DIAGNOSIS — R1011 Right upper quadrant pain: Secondary | ICD-10-CM

## 2014-03-05 DIAGNOSIS — R5381 Other malaise: Secondary | ICD-10-CM

## 2014-03-05 NOTE — Patient Instructions (Signed)
  Increase the lexapro to 1/2 tablet--change to taking it in the evening/bedtime.  Expect some increase in anxiety, which should be temporary (maybe a week), and use the alprazolam if you need to.  After 2 weeks, if your anxiety has gone back to baseline, but you are still feeling depressed, add an extra 1/4 tablet (3/4 tablet) for 1-2 weeks, and then if tolerated, try the full tablet.  Go up very slowly, given your history of intolerance, and don't increase the dose if you already are feeling bad--stay at the lower dose longer.  If your moods are not improving despite gradually increasing the lexapro dose, then we probably should send you to a psychiatrist for further evaluation.   Dr. Letta Moynahan, Dr. Chucky May are two women psychiatrists that I recommend. Dr. Casimiro Needle is a female psychiatrist who specializes in geriatrics  Your abdominal pain may be related to your recent steroids (for bursitis), given your previous reaction, which lasted for months.  You likely had a lower dose, so hopefully you won't get as sick as in the past.  Continue the prilosec--take it an hour before a meal (wait 1-2 hours after taking Synthroid before taking Prilosec; ideally take the prilosec an hour before breakfast).  If needed (meaning that you have ongoing stomach pain, especially in the evening, when the morning dose of prilosec may be wearing off), then take another dose of prilosec before bedtime.  If you have persistent change in bowels and/or abdominal pain, consider follow-up with Dr. Benson Norway, and we may need to check another ultrasound to look at your liver/gall bladder if the pain on the right side persists.  Also if having ongoing pain, would like to repeat your blood count.   Follow up in 1-2 weeks if not improving, otherwise follow-up in 1 month

## 2014-03-05 NOTE — Progress Notes (Signed)
Chief Complaint  Patient presents with  . Fatigue    extreme fatigue, no appetite. She thinks that she doesn't have an appetite because every time she eats her stomach feels so bloated. Also wants to discuss her depression-saw Almyra Free yesterday.     "I'm fatigued to the point where I can't function".  30 minutes after eating this morning her stomach felt swollen, but not painful.  She had decrease in her appetite (when feeling this way--appetite was fine in the morning) but she forced herself to eat an egg for lunch.  Denies nausea.  She has burning in her upper stomach at night, when she is lying down in bed.  Sometimes it wakes her up from sleep.  GI symptoms have been off and on over the last month, but worse over the last 2 weeks.  She has been taking prilosec before lunch, about 15-30 minutes before eating.  She also uses pepcid or gaviscon prn, and that seems to help.  It seems to be with everything she eats, no particular food.  She has been doing [employer] for the last 3 days.  She has not had any vomiting.  Stools have been slightly loose and yellowy, without any significant changes in her diet.  She took imodium once with diarrhea.  Last stool was today, and slightly yellow, but normal in consistency.  She has some bursitis in her right knee, and Dr. Trudie Reed did a steroid injection.  She states she was feeling sick prior to getting the injection (so doesn't blame the injection), but it was just the fatigue--the GI symptoms started more recently.   She had severe abdominal pain after steroid injection in the past, with eval by Dr. Hung--ultimately it self-resolved, and was felt to be related to the steroids.  Depression/Anxiety:  She saw Marya Amsler yesterday--she asked Almyra Free if she was depressed and Almyra Free said yes, which seemed to surprise the patient.  She thought she would be better by now (she was referred to [facility] for treatment of anxiety and depression, due to intolerance of medications).  She  has only been on very low dose lexapro (1/4 of a 5mg  tablet). She states she went up to 1/2 tablet of 5mg  Lexapro for about 4 days. She went back down to the 1/4 tablet after she felt more anxious.  This was about a week ago.  She is asking to go back on Prozac. I had to look back in the record to remind her that we did this back in 10/2012 (at her request)--at just 5mg  it made her feel too jittery, and she stayed on it for 2 weeks without improvement in the increased anxiety, so it was stopped.  She generally uses 1/2 alprazolam between 1-3 in the afternoon, or sometimes at bedtime to help her sleep if she didn't take it earlier in the day.  She needed to take 1/2 twice daily (3 hrs after taking 1/2 tablet of lexapro, and then again 1/2 tablet at bedtime) while she was at the higher dose of lexapro.  She is complaining of feeling extremely tired, but also admits to sleeping poorly.  She isn't sleeping well at all--due to hip pain,stomach burning wakes her up, takes an hour to fall back asleep.  Up 2 times to void at night.  She only sleeps about 4 hrs/day.  She states her husband told her not to nap in the afternoon, but sometimes she can't help it.  When she took a very long nap in  the afternoon she woke up feeling refreshed, much better.  She has also seen cardiology as part of evaluation of fatigue (with bradycardia/bigeminy)--felt to NOT be bradycardic, and that PVC's aren't contributing to her fatigue.  Normal echo and lab w/u.  Past Medical History  Diagnosis Date  . Depression     treated in the past for years;stopped in 2010 for a years  . Panic attack   . Claustrophobia   . Fibromyalgia   . IBS (irritable bowel syndrome)     Dr. Benson Norway  . Hypercholesteremia     with high HDL  . Glaucoma, narrow-angle     s/p laser surgery  . Duodenal ulcer 1962    h/o  . Hyperthyroidism     h/o x 2 years  . Shingles 1999    h/o  . Hypothyroid 9/08  . GERD (gastroesophageal reflux disease)   .  Trochanteric bursitis 12/2008    bilateral  . Superficial thrombophlebitis 03/2009    RLE  . Bell's palsy 1966  . Osteoporosis 10/11    Dr.Hawkes  . SLE (systemic lupus erythematosus)     with arthralgia/myalgia, positive ANA centromere pattern, positive DNA, positive RNP, indeterminant anti-cardiolipin antibody-Dr.Hawkes.  . Carotid artery disease 2010    on vascular screening;unchanged 2013.(could not tolerate simvastatin, no other statins tried)--<30% blockage bilat 07/2011  . Recurrent UTI     has cystocele-Dr.Grewal  . Chronic fatigue and malaise   . Chronic abdominal pain   . 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 Tonsillectomy  1946  . Vaginal hysterectomy  1971    and bladder repair.  Still has ovaries  . Cataract extraction, bilateral  1995, 1996  . Thyroidectomy, partial  09/2005    L nodule; Dr. Harlow Asa  . Upper gi endoscopy  06/27/12  . Flexible sigmoidoscopy     History   Social History  . Marital Status: Married    Spouse Name: N/A    Number of Children: 2  . Years of Education: N/A   Occupational History  . retired (school system)    Social History Main Topics  . Smoking status: Never Smoker   . Smokeless tobacco: Never Used  . Alcohol Use: No  . Drug Use: No  . Sexual Activity: Not on file   Other Topics Concern  . Not on file   Social History Narrative   Married.  Son lives in [place]; Daughter [name] lives in [place]; 2 grandchildren   Outpatient Encounter Prescriptions as of 03/05/2014  Medication Sig Note  . ALPRAZolam (XANAX) 0.5 MG tablet TAKE 1/2 TO 1 TABLET BY MOUTH AS NEEDED FOR SLEEP OR ANXIETY   . ARTIFICIAL TEAR OP Apply 1-2 drops to eye 4 (four) times daily as needed (dry eyes).    Marland Kitchen b complex vitamins capsule Take 1 capsule by mouth 2 (two) times daily.    . cholecalciferol (VITAMIN D) 1000 UNITS tablet Take 1,000 Units by mouth 2 (two) times  daily.    Marland Kitchen escitalopram (LEXAPRO) 5 MG tablet START AT 1/2 TABLET FOR 4-7 DAYS, THEN INCREASE TO FULL TABLET IF TOLERATED 09/29/2013: Taking 1/4 tablet without side effects  . SYNTHROID 50 MCG tablet Take 0.5-1 tablets (25-50 mcg total) by mouth daily before breakfast. Take half tablet Mon, Wed, Fri, full tablet on Tues, Thurs ,Sat and Sun   . vitamin E 400 UNIT capsule Take 400 Units by  mouth every Monday, Wednesday, and Friday. 3 times weekly   . omeprazole (PRILOSEC) 20 MG capsule Take 20 mg by mouth daily as needed (heartburn). 09/29/2013: Uses prn--once every 2 weeks, depending on what she eats   Allergies  Allergen Reactions  . Salmon [Fish Allergy] Hives and Shortness Of Breath  . Shellfish Allergy Anaphylaxis  . Aspirin Other (See Comments)    Sever stomach pain due to ulcer scaring.  . Ciprofloxacin Diarrhea  . Codeine Nausea And Vomiting  . Darvocet [Propoxyphene N-Acetaminophen] Nausea And Vomiting  . Demerol [Meperidine] Nausea And Vomiting    Violent vomitting  . Diphedryl [Diphenhydramine] Other (See Comments)    Increased pulse/small amount ok  . Doxycycline Hyclate Other (See Comments)    GI intolerance.  Marland Kitchen Epinephrine Other (See Comments)    Breathing problems  . Erythromycin Other (See Comments)    GI intolerance.  Yvette Rack [Cyclobenzaprine] Other (See Comments)    Tingly/prickly sensation.  . Iodine   . Keflex [Cephalexin] Hives  . Latex Other (See Comments)    Gloves ok  . Prednisone Other (See Comments)    Headache  . Pylera [Bis Subcit-Metronid-Tetracyc] Swelling    Tongue swelling. Face tingling  . Sulfa Antibiotics Other (See Comments)    Increased pulse, fainting, diarrhea, thrush  . Xylocaine [Lidocaine Hcl]     With epinephrine, given by dentist.  Speeded up heart rate and she passed out (occured twice, at dentist)  . Zoloft [Sertraline Hcl] Swelling and Other (See Comments)    Migraine Swelling of tongue/lip (09/2012)  . Advil [Ibuprofen] Rash     Motrin ok with a GI effect.  . Penicillins Hives and Rash   ROS:  Denies fevers, chills.  Denies URI or allergy symptoms.  No cough, shortness of breath.  Denies skin rashes, bleeding/bruising. Denies any urinary symptoms.  She has chronic pain, but no significant changes noted. Still some pain at her knee from bursitis, but improved.   Denies any black/tarry stools. Weight fluctuates 116-120, unchanged from visit 8/2, down 2# from June  PHYSICAL EXAM: BP 140/70  Pulse 48  Ht 4\' 11"  (1.499 m)  Wt 115 lb (52.164 kg)  BMI 23.21 kg/m2 Elderly female, in no distress.   HEENT:  PERRL, EOMI, conjunctiva clear Neck: no lymphadenopathy, thyromegaly or carotid bruit Heart: Ectopic beats every 2-4 beats--started out every 4th beat, then irregular every 2-5 beats, but then ultimately stayed in bigeminy for a while.  Pulse 62 on repeat. Lungs: clear bilaterally Abdomen: Tender in RUQ. No rebound tenderness, organomegaly or mass.  nontender in epigastrium. Extremities: no edema, 2+ pulse Neuro: alert and oriented.  Seems somewhat tired today, not recalling details/dates as well as in the past.  Normal gait, strength, cranial nerves Psych: normal eye contact, speech, hygiene and grooming.  Depressed mood, slightly depressed affect, not anxious today.  Lab Results  Component Value Date   WBC 11.8* 02/24/2014   HGB 14.5 02/24/2014   HCT 42.2 02/24/2014   MCV 87.2 02/24/2014   PLT 308 02/24/2014     Chemistry      Component Value Date/Time   NA 138 02/24/2014 1153   K 4.1 02/24/2014 1153   CL 103 02/24/2014 1153   CO2 26 02/24/2014 1153   BUN 15 02/24/2014 1153   CREATININE 0.68 02/24/2014 1153   CREATININE 0.78 07/04/2013 1045      Component Value Date/Time   CALCIUM 9.1 02/24/2014 1153   ALKPHOS 64 02/24/2014 1153   AST 17  02/24/2014 1153   ALT 15 02/24/2014 1153   BILITOT 0.9 02/24/2014 1153     nonfasting glucose 119  Lab Results  Component Value Date   TSH 2.576 02/24/2014   Review of chart shows: Last EKG  was 06/2013 which showed bigeminy. Last visit with cardiology was 07/2012, who didn't feel that the PVC's (without any AVB) was cause of fatigue.  Echo, exam and labs were fine.  ASSESSMENT/PLAN:  Depressive disorder, not elsewhere classified - suboptimally controlled on subtherapeutic dose of lexapro and counseling.  Gradually increase lexapro dose.  consider psych referral (names given)  Anxiety state, unspecified - expect some worsening of symptoms with increased lexapro.  use alprazolam prn.  change lexapro dose to evening  Unspecified hypothyroidism - adequately replaced and not contributing to her fatigue  IBS (irritable bowel syndrome) - abdominal bloating and R and L UQ pains. Discussed proper dosing of prilosec; consider f/u with GI, U/S, repeat CBC  Other malaise and fatigue - multifactorial, with inadequate sleep being a component along with depression  RUQ abdominal pain   Increase the lexapro to 1/2 tablet--change to taking it in the evening/bedtime.  Expect some increase in anxiety, which should be temporary (maybe a week), and use the alprazolam if you need to.  After 2 weeks, if your anxiety has gone back to baseline, but you are still feeling depressed, add an extra 1/4 tablet (3/4 tablet) for 1-2 weeks, and then if tolerated, try the full tablet.  Go up very slowly, given your history of intolerance, and don't increase the dose if you already are feeling bad--stay at the lower dose longer.  If your moods are not improving despite gradually increasing the lexapro dose, then we probably should send you to a psychiatrist for further evaluation.   Dr. Letta Moynahan, Dr. Chucky May are two women psychiatrists that I recommend. Dr. Casimiro Needle is a female psychiatrist who specializes in geriatrics  Your abdominal pain may be related to your recent steroids (for bursitis), given your previous reaction, which lasted for months.  You likely had a lower dose, so hopefully you won't  get as sick as in the past.  Continue the prilosec--take it an hour before a meal (wait 1-2 hours after taking Synthroid before taking Prilosec; ideally take the prilosec an hour before breakfast).  If needed (meaning that you have ongoing stomach pain, especially in the evening, when the morning dose of prilosec may be wearing off), then take another dose of prilosec before bedtime.  If you have persistent change in bowels and/or abdominal pain, consider follow-up with Dr. Benson Norway, and we may need to check another ultrasound to look at your liver/gall bladder if the pain on the right side persists.  Also if having ongoing pain, would like to repeat your blood count.   Follow up in 1-2 weeks if not improving, otherwise follow-up in 1 month (scheduled for next month)

## 2014-03-06 ENCOUNTER — Ambulatory Visit
Admission: RE | Admit: 2014-03-06 | Discharge: 2014-03-06 | Disposition: A | Discharge status: Home / Self Care | Payer: Medicare Other | Source: Ambulatory Visit

## 2014-03-06 DIAGNOSIS — Z1231 Encounter for screening mammogram for malignant neoplasm of breast: Secondary | ICD-10-CM | POA: Diagnosis not present

## 2014-03-09 ENCOUNTER — Other Ambulatory Visit: Payer: Self-pay | Admitting: Gastroenterology

## 2014-03-09 DIAGNOSIS — R5383 Other fatigue: Secondary | ICD-10-CM | POA: Diagnosis not present

## 2014-03-09 DIAGNOSIS — R142 Eructation: Secondary | ICD-10-CM | POA: Diagnosis not present

## 2014-03-09 DIAGNOSIS — R1011 Right upper quadrant pain: Secondary | ICD-10-CM

## 2014-03-09 DIAGNOSIS — R634 Abnormal weight loss: Secondary | ICD-10-CM | POA: Diagnosis not present

## 2014-03-09 DIAGNOSIS — R141 Gas pain: Secondary | ICD-10-CM | POA: Diagnosis not present

## 2014-03-09 DIAGNOSIS — R5381 Other malaise: Secondary | ICD-10-CM | POA: Diagnosis not present

## 2014-03-12 ENCOUNTER — Encounter: Payer: Self-pay | Admitting: Family Medicine

## 2014-03-17 ENCOUNTER — Ambulatory Visit (HOSPITAL_COMMUNITY)
Admission: RE | Admit: 2014-03-17 | Discharge: 2014-03-17 | Disposition: A | Discharge status: Home / Self Care | Payer: Medicare Other | Source: Ambulatory Visit | Attending: Gastroenterology | Admitting: Gastroenterology

## 2014-03-17 DIAGNOSIS — R1011 Right upper quadrant pain: Secondary | ICD-10-CM

## 2014-03-17 DIAGNOSIS — R110 Nausea: Secondary | ICD-10-CM | POA: Diagnosis not present

## 2014-03-17 MED ORDER — SINCALIDE 5 MCG IJ SOLR
0.0200 ug/kg | Freq: Once | INTRAMUSCULAR | Status: AC
  Administered 2014-03-17: 1.05 ug via INTRAVENOUS

## 2014-03-17 MED ORDER — STERILE WATER FOR INJECTION IJ SOLN
INTRAMUSCULAR | Status: AC
  Filled 2014-03-17: qty 10

## 2014-03-17 MED ORDER — SINCALIDE 5 MCG IJ SOLR
INTRAMUSCULAR | Status: AC
  Administered 2014-03-17: 1.05 ug via INTRAVENOUS
  Filled 2014-03-17: qty 5

## 2014-03-17 MED ORDER — TECHNETIUM TC 99M MEBROFENIN IV KIT
5.0000 | PACK | Freq: Once | INTRAVENOUS | Status: AC | PRN
  Administered 2014-03-17: 5 via INTRAVENOUS

## 2014-03-20 ENCOUNTER — Ambulatory Visit (INDEPENDENT_AMBULATORY_CARE_PROVIDER_SITE_OTHER): Payer: Medicare Other | Admitting: Licensed Clinical Social Worker

## 2014-03-20 DIAGNOSIS — F411 Generalized anxiety disorder: Secondary | ICD-10-CM

## 2014-03-26 ENCOUNTER — Other Ambulatory Visit: Payer: Self-pay | Admitting: Family Medicine

## 2014-03-26 NOTE — Telephone Encounter (Signed)
Is this okay to refill? 

## 2014-03-26 NOTE — Telephone Encounter (Signed)
Last filled 2 mos ago for #30.  Okay to refill #30, no refill

## 2014-04-01 ENCOUNTER — Ambulatory Visit (INDEPENDENT_AMBULATORY_CARE_PROVIDER_SITE_OTHER): Payer: Medicare Other | Admitting: Family Medicine

## 2014-04-01 ENCOUNTER — Encounter: Payer: Self-pay | Admitting: Family Medicine

## 2014-04-01 VITALS — BP 110/68 | HR 72 | Ht 59.0 in | Wt 114.0 lb

## 2014-04-01 DIAGNOSIS — F3289 Other specified depressive episodes: Secondary | ICD-10-CM | POA: Diagnosis not present

## 2014-04-01 DIAGNOSIS — E039 Hypothyroidism, unspecified: Secondary | ICD-10-CM

## 2014-04-01 DIAGNOSIS — F411 Generalized anxiety disorder: Secondary | ICD-10-CM

## 2014-04-01 DIAGNOSIS — R197 Diarrhea, unspecified: Secondary | ICD-10-CM

## 2014-04-01 DIAGNOSIS — R5382 Chronic fatigue, unspecified: Secondary | ICD-10-CM

## 2014-04-01 DIAGNOSIS — F329 Major depressive disorder, single episode, unspecified: Secondary | ICD-10-CM | POA: Diagnosis not present

## 2014-04-01 DIAGNOSIS — K219 Gastro-esophageal reflux disease without esophagitis: Secondary | ICD-10-CM

## 2014-04-01 DIAGNOSIS — R5383 Other fatigue: Secondary | ICD-10-CM

## 2014-04-01 DIAGNOSIS — R5381 Other malaise: Secondary | ICD-10-CM

## 2014-04-01 MED ORDER — ESCITALOPRAM OXALATE 5 MG PO TABS: ORAL_TABLET | ORAL | Status: DC

## 2014-04-01 NOTE — Patient Instructions (Addendum)
Continue your current medications.  Continue the lexapro at 1/2 tablet daily. Continue counseling with Almyra Free, and using the alprazolam sparingly, as needed for anxiety.  If your runny nose, post-nasal drainage or sinus pressure worsens, consider using claritin and/or neti-pot.  Cut back on dose/frequency of imodium such that is isn't causing constipation that requires Miralax.  When you do use the miralax, use just 1/2 dose.  Try and adjust the dose of imodium (1/2 tablet, less frequent, only when needed) such that you don't need any treatment for constipation.

## 2014-04-01 NOTE — Progress Notes (Signed)
Chief Complaint  Patient presents with  . Med check    nonfasting med check. Had gallbladder test by Dr.Hung and wanted to discuss with you .    She was last seen a month ago with fatigue and abdominal bloating, decrease in appetite.  She saw Dr. Benson Norway in f/u and had normal HIDA scan.  He also repeated labs (not available at the moment; she recalls it being "the same"--white count had been a little elevated--notes not scanned in computer yet).  She has had ongoing diarrhea--he recommended using imodium regularly for 2 weeks and to stop the prilosec.  The imodium caused so much constipation that she had to start on miralax 3 days ago.  She has gotten some relief, had some additional diarrhea.  Took 1/2 imodium yesterday and seems fine today. She is using gaviscon as needed for reflux (infrequent, depending on her diet).  Stomach "feels better than it did", but she still feels a LUQ swelling after eating.  Overall less bloated than before.  She still doesn't have much of an appetite, but she eats regularly (small amounts).  Her weight is down 1 pound from her last visit.  Anxiety:  She continues to see Marya Amsler, which she admits is very helpful. She has had some recent relevations (her responsibility in how she feels rather than blaming others), and feels as though a weight has been lifted.  Anxiety overall is doing better. She is taking 2.5mg  of Lexapro daily, and needing 1/2 alprazolam 4x/week at bedtime.  Hypothyroidism: Taking meds as prescribed, on an empty stomach, separate from her medications/vitamins.  No significant changes in hair/skin/nails (losing some hair in the last few months--last thyroid was within the month).  Bowel changes per HPI (diarrhea/constipation).  Some days she feels "really good" and some days she feels "really tired".  She hasn't paid attention to whether this was after a night of good vs poor sleep (taking alprazolam or not).  Past Medical History  Diagnosis Date  .  Depression     treated in the past for years;stopped in 2010 for a years  . Panic attack   . Claustrophobia   . Fibromyalgia   . IBS (irritable bowel syndrome)     Dr. Benson Norway  . Hypercholesteremia     with high HDL  . Glaucoma, narrow-angle     s/p laser surgery  . Duodenal ulcer 1962    h/o  . Hyperthyroidism     h/o x 2 years  . Shingles 1999    h/o  . Hypothyroid 9/08  . GERD (gastroesophageal reflux disease)   . Trochanteric bursitis 12/2008    bilateral  . Superficial thrombophlebitis 03/2009    RLE  . Bell's palsy 1966  . Osteoporosis 10/11    Dr.Hawkes  . SLE (systemic lupus erythematosus)     with arthralgia/myalgia, positive ANA centromere pattern, positive DNA, positive RNP, indeterminant anti-cardiolipin antibody-Dr.Hawkes.  . Carotid artery disease 2010    on vascular screening;unchanged 2013.(could not tolerate simvastatin, no other statins tried)--<30% blockage bilat 07/2011  . Recurrent UTI     has cystocele-Dr.Grewal  . Chronic fatigue and malaise   . Chronic abdominal pain   . 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 Tonsillectomy  1946  . Vaginal hysterectomy  1971    and bladder repair.  Still has ovaries  . Cataract extraction, bilateral  1995, 1996  .  Thyroidectomy, partial  09/2005    L nodule; Dr. Harlow Asa  . Upper gi endoscopy  06/27/12  . Flexible sigmoidoscopy     History   Social History  . Marital Status: Married    Spouse Name: N/A    Number of Children: 2  . Years of Education: N/A   Occupational History  . retired (school system)    Social History Main Topics  . Smoking status: Never Smoker   . Smokeless tobacco: Never Used  . Alcohol Use: No  . Drug Use: No  . Sexual Activity: Not on file   Other Topics Concern  . Not on file   Social History Narrative   Married.  Son lives in [place]; Daughter [name] lives in [place]; 2 grandchildren     Outpatient Encounter Prescriptions as of 04/01/2014  Medication Sig Note  . ALPRAZolam (XANAX) 0.5 MG tablet TAKE ONE-HALF OR ONE TABLET BY MOUTH AS NEEDED FOR SLEEP OR ANXIETY 04/01/2014: Takes 1/2 tablet at bedtime about 4x/week (feels anxious in the evenings, to help her sleep)  . ARTIFICIAL TEAR OP Apply 1-2 drops to eye 4 (four) times daily as needed (dry eyes).    Marland Kitchen b complex vitamins capsule Take 1 capsule by mouth 2 (two) times daily.    . cholecalciferol (VITAMIN D) 1000 UNITS tablet Take 1,000 Units by mouth 2 (two) times daily.    Marland Kitchen escitalopram (LEXAPRO) 5 MG tablet START AT 1/2 TABLET FOR 4-7 DAYS, THEN INCREASE TO FULL TABLET IF TOLERATED 04/01/2014: Taking 1/2 tab daily  . polyethylene glycol (MIRALAX / GLYCOLAX) packet Take 17 g by mouth daily. 04/01/2014: Just restarted x 3 days (after being constipated from using imodium)  . SYNTHROID 50 MCG tablet Take 0.5-1 tablets (25-50 mcg total) by mouth daily before breakfast. Take half tablet Mon, Wed, Fri, full tablet on Tues, Thurs ,Sat and Sun 04/01/2014: MWF takes 1/2 tablet, full tablet the rest of the week  . vitamin E 400 UNIT capsule Take 400 Units by mouth every Monday, Wednesday, and Friday. 3 times weekly   . [DISCONTINUED] omeprazole (PRILOSEC) 20 MG capsule Take 20 mg by mouth daily as needed (heartburn). 04/01/2014: Stopped by Dr. Benson Norway (?causing diarrhea)   Allergies  Allergen Reactions  . Salmon [Fish Allergy] Hives and Shortness Of Breath  . Shellfish Allergy Anaphylaxis  . Aspirin Other (See Comments)    Sever stomach pain due to ulcer scaring.  . Ciprofloxacin Diarrhea  . Codeine Nausea And Vomiting  . Darvocet [Propoxyphene N-Acetaminophen] Nausea And Vomiting  . Demerol [Meperidine] Nausea And Vomiting    Violent vomitting  . Diphedryl [Diphenhydramine] Other (See Comments)    Increased pulse/small amount ok  . Doxycycline Hyclate Other (See Comments)    GI intolerance.  Marland Kitchen Epinephrine Other (See Comments)     Breathing problems  . Erythromycin Other (See Comments)    GI intolerance.  Yvette Rack [Cyclobenzaprine] Other (See Comments)    Tingly/prickly sensation.  . Iodine   . Keflex [Cephalexin] Hives  . Latex Other (See Comments)    Gloves ok  . Prednisone Other (See Comments)    Headache  . Pylera [Bis Subcit-Metronid-Tetracyc] Swelling    Tongue swelling. Face tingling  . Sulfa Antibiotics Other (See Comments)    Increased pulse, fainting, diarrhea, thrush  . Xylocaine [Lidocaine Hcl]     With epinephrine, given by dentist.  Speeded up heart rate and she passed out (occured twice, at dentist)  . Zoloft [Sertraline Hcl] Swelling and Other (See  Comments)    Migraine Swelling of tongue/lip (09/2012)  . Advil [Ibuprofen] Rash    Motrin ok with a GI effect.  . Penicillins Hives and Rash    ROS:  Right cheek feels puffy/swollen when she wakes up in the morning.  She does report having a "leaky nose"--runs a lot, clear.  Denies sneezing or itchy eyes.  Denies cough, shortness of breath, chest pain, palpitations.  Denies nausea, vomiting.  Infrequent hearburn--see HPI.  Denies urinary complaints, bleeding/bruising, rashes.  +dry skin.  PHYSICAL EXAM: BP 110/68  Pulse 72  Ht 4\' 11"  (1.499 m)  Wt 114 lb (51.71 kg)  BMI 23.01 kg/m2 Well developed, pleasant female in no distress.  She is talkative, and much less anxious than at prior visits. She occasionally sniffles and clear her throat.  No coughing. HEENT:  PERRL, EOMI, conjunctiva clear.  Nasal mucosa is moderately edematous, pale, clear mucus.  OP has very mild cobbletoning posteriorly. Neck: no lymphadenopathy, thyromegaly or mass Heart: regular rate and rhythm Lungs: clear bilaterally Abdomen:  Very mild tenderness in RUQ. No rebound, guarding, organomegaly or Murphy sign.  Normal bowel sounds, nondistended Extremities: no edema, 2+ pulse Neuro: alert and oriented. Cranial nerves intact. Normal gait, strength, sensation Psych: normal  mood, affect, hygiene and grooming. Normal eye contact and speech Skin: no rashes   Lab Results  Component Value Date   WBC 11.8* 02/24/2014   HGB 14.5 02/24/2014   HCT 42.2 02/24/2014   MCV 87.2 02/24/2014   PLT 308 02/24/2014     Chemistry      Component Value Date/Time   NA 138 02/24/2014 1153   K 4.1 02/24/2014 1153   CL 103 02/24/2014 1153   CO2 26 02/24/2014 1153   BUN 15 02/24/2014 1153   CREATININE 0.68 02/24/2014 1153   CREATININE 0.78 07/04/2013 1045      Component Value Date/Time   CALCIUM 9.1 02/24/2014 1153   ALKPHOS 64 02/24/2014 1153   AST 17 02/24/2014 1153   ALT 15 02/24/2014 1153   BILITOT 0.9 02/24/2014 1153     Lab Results  Component Value Date   TSH 2.576 02/24/2014   ASSESSMENT/PLAN:  Anxiety state, unspecified - stable, improved. continue 2.5mg  lexapro and counseling.  using alprazolam sparingly for anxiety (not for insomnia) - Plan: escitalopram (LEXAPRO) 5 MG tablet  Unspecified hypothyroidism - adequately replaced per recent labs.  reviewed proper way to take meds  Depressive disorder, not elsewhere classified - stable, improved. continue counseling with Marya Amsler and lexapro 2.5mg  daily  Gastroesophageal reflux disease without esophagitis - improved.  continue proper diet and prn use of gaviscon  Chronic fatigue - overall significantly improved, now sporadic  Diarrhea - somewhat improved after stopping prilosec.  Too aggressive in imodium use, causing constipation, requiring treatment with Miralax  Questions on how to take thyroid med reviewed/answered in detail (questions re: timing, vitamins)  Rhinitis--allergic vs other.  Minimally symptomatic.  Reviewed symptoms and treatment, if needed (ie claritin).  Counseled extensively re: diarrhea and constipation, and meds she is using.  Cut back on use of imodium (to prn, down to 1/2 tablet if full tablet causes constipation).  Stop use of Miralax if no longer having constipation--discussed not needing both meds to balance  each other out, but to adjust down the imodium so miralax shouldn't be needed.  25 min visit, more than 1/2 spent counseling and answering questions.  Pt reassured. Doing well  F/U in 6 mos, sooner prn

## 2014-04-10 ENCOUNTER — Ambulatory Visit (INDEPENDENT_AMBULATORY_CARE_PROVIDER_SITE_OTHER): Payer: Medicare Other | Admitting: Licensed Clinical Social Worker

## 2014-04-10 DIAGNOSIS — F411 Generalized anxiety disorder: Secondary | ICD-10-CM

## 2014-04-14 DIAGNOSIS — Z01419 Encounter for gynecological examination (general) (routine) without abnormal findings: Secondary | ICD-10-CM | POA: Diagnosis not present

## 2014-04-21 ENCOUNTER — Encounter: Payer: Self-pay | Admitting: Family Medicine

## 2014-04-22 ENCOUNTER — Other Ambulatory Visit: Payer: Medicare Other

## 2014-04-28 ENCOUNTER — Other Ambulatory Visit (INDEPENDENT_AMBULATORY_CARE_PROVIDER_SITE_OTHER): Payer: Medicare Other

## 2014-04-28 DIAGNOSIS — Z23 Encounter for immunization: Secondary | ICD-10-CM | POA: Diagnosis not present

## 2014-05-01 ENCOUNTER — Ambulatory Visit (INDEPENDENT_AMBULATORY_CARE_PROVIDER_SITE_OTHER): Payer: Medicare Other | Admitting: Licensed Clinical Social Worker

## 2014-05-01 DIAGNOSIS — F419 Anxiety disorder, unspecified: Secondary | ICD-10-CM

## 2014-05-05 ENCOUNTER — Encounter: Payer: Self-pay | Admitting: Family Medicine

## 2014-05-08 ENCOUNTER — Other Ambulatory Visit: Payer: Self-pay

## 2014-05-13 DIAGNOSIS — N390 Urinary tract infection, site not specified: Secondary | ICD-10-CM | POA: Diagnosis not present

## 2014-05-13 DIAGNOSIS — N952 Postmenopausal atrophic vaginitis: Secondary | ICD-10-CM | POA: Diagnosis not present

## 2014-05-22 ENCOUNTER — Ambulatory Visit (INDEPENDENT_AMBULATORY_CARE_PROVIDER_SITE_OTHER): Payer: Medicare Other | Admitting: Licensed Clinical Social Worker

## 2014-05-22 DIAGNOSIS — F419 Anxiety disorder, unspecified: Secondary | ICD-10-CM | POA: Diagnosis not present

## 2014-05-25 ENCOUNTER — Encounter: Payer: Self-pay | Admitting: Family Medicine

## 2014-05-28 ENCOUNTER — Other Ambulatory Visit: Payer: Self-pay | Admitting: Family Medicine

## 2014-05-28 ENCOUNTER — Encounter: Payer: Self-pay | Admitting: Family Medicine

## 2014-05-28 MED ORDER — ALPRAZOLAM 0.5 MG PO TABS: 0.5000 mg | ORAL_TABLET | Freq: Every evening | ORAL | Status: DC | PRN

## 2014-06-11 DIAGNOSIS — M25569 Pain in unspecified knee: Secondary | ICD-10-CM | POA: Diagnosis not present

## 2014-06-11 DIAGNOSIS — M797 Fibromyalgia: Secondary | ICD-10-CM | POA: Diagnosis not present

## 2014-06-11 DIAGNOSIS — M810 Age-related osteoporosis without current pathological fracture: Secondary | ICD-10-CM | POA: Diagnosis not present

## 2014-06-11 DIAGNOSIS — M329 Systemic lupus erythematosus, unspecified: Secondary | ICD-10-CM | POA: Diagnosis not present

## 2014-06-21 ENCOUNTER — Encounter: Payer: Self-pay | Admitting: Family Medicine

## 2014-06-22 ENCOUNTER — Ambulatory Visit (INDEPENDENT_AMBULATORY_CARE_PROVIDER_SITE_OTHER): Payer: Medicare Other | Admitting: Family Medicine

## 2014-06-22 ENCOUNTER — Ambulatory Visit: Payer: Medicare Other | Admitting: Licensed Clinical Social Worker

## 2014-06-22 ENCOUNTER — Ambulatory Visit: Payer: Medicare Other | Admitting: Family Medicine

## 2014-06-22 ENCOUNTER — Encounter: Payer: Self-pay | Admitting: Family Medicine

## 2014-06-22 VITALS — BP 142/70 | HR 52 | Ht 59.0 in | Wt 116.0 lb

## 2014-06-22 DIAGNOSIS — M545 Low back pain: Secondary | ICD-10-CM

## 2014-06-22 DIAGNOSIS — M940 Chondrocostal junction syndrome [Tietze]: Secondary | ICD-10-CM | POA: Diagnosis not present

## 2014-06-22 DIAGNOSIS — R079 Chest pain, unspecified: Secondary | ICD-10-CM | POA: Diagnosis not present

## 2014-06-22 DIAGNOSIS — M25512 Pain in left shoulder: Secondary | ICD-10-CM | POA: Diagnosis not present

## 2014-06-22 NOTE — Progress Notes (Signed)
Chief Complaint  Patient presents with  . Chest Pain    and right shoulder pain that began yesterday am @ 8:15am.    MyChart message from patient received earlier today (sent at 10:52 last night): "I had an episode Sunday Am (29th) around 8:15 that lasted for abt 40 min. experienced sharp pain in left side of chest to right of left breast and it went up into my shoulder. It was a very excruciating sharp pain. I felt like some one was stabbing me. I had a slight swelling inside of left side of lips. Not on outside. I think I need to see you sometime early Monday. I took 2 baby aspirin, unlocked my front door and kept phone with me just in case, [name] was at church! So I was pretty much alone and scared.  I don't know if it could of been a stroke! Will call to see you am. I am afraid I won't get much sleep tonight. I did take one of my aprazaloam around 10 am Sunday and did sleep for 3 hours--I was so exhausted"  Pain felt like someone was punching her, like a "hard pressure" in the left side of her chest, and had knife-like sharp pain in her left shoulder. Both places were hurting at the same time.  She noticed the pain about 10 minutes after getting out of bed in the morning (yesterday).  Pain started acutely/suddenly when she was going to lay down on a heating pad for her back (scooting over in bed to get on the pad).  Didn't feel like she strained anything.  Denied any shortness of breath, diaphoresis, chest pain, nausea.  Lasted 40-45 minutes, and gradually dissipated on its own.  Hasn't had any recurrent pain, just a slight residual soreness in chest and shoulder.  She has been taking 200mg  ibuprofen twice daily for the last 6 days for her low back pain (didn't help).  She had slight numbness/swelling on the inside of her left mouth. She then denies any numbness, just a little swelling.  She had some tingling on her left face during this episode, which resolved after taking xanax.  She was  very scared, didn't think she was hyperventilating.  Denies any new foods, contacts, trauma/injury.  She is having low back pain, and "prickliness" in her legs, as well as some pain in her upper back.  She plans to see chiropractor later today.  He has been seeing her regularly for back pain.  PMH, PSH, SH reviewed. Meds and allergies reviewed.  ROS:  No fevers, chills, dizziness, headache, shortness of breath, URI symptoms.  No bleeding, bruising, vomiting, bowel changes, urinary complaints.  See HPI.  PHYSICAL EXAM BP 142/70 mmHg  Pulse 52  Ht 4\' 11"  (1.499 m)  Wt 116 lb (52.617 kg)  BMI 23.42 kg/m2  Pleasant female, in mild-moderate discomfort with position changes (back, and chest) HEENT:  PERRL, EOMI, conjunctiva clear.  OP is clear--no erythema, swelling, lesions.  Normal sensation to light touch and temperature on her cheek Neck: no lymphadenopathy or mass Heart: regular rate and rhythm, noted to be in bigeminy (extra beats every other beat) for part of the exam, other times less frequent PVCs. No murmur Lungs: clear bilaterally Back: no CVA tenderness, no spinal tenderness Shoulders: Tender at left AC joint, no edema, warmth, muscle spasm.  normal on the right Chest:  Very tender along multiple costochondral junctions, more tender inferiorly, but extending over at least 4-5 levels. Pectoralis muscle is nontender.  Breast is without any notable mass or tenderness. Abdomen: mild epigastric discomfort, otherwise normal (chronic for her) Extremities: no edema Neuro: alert and oriented.  Cranial nerves intact.  Normal sensation, strength, gait.  EKG:  Frequent PVC's. No acute changes  ASSESSMENT/PLAN:  Costochondritis  Chest pain, unspecified chest pain type - reassured that her symptoms are not cardiac - Plan: EKG 12-Lead  Pain in joint, shoulder region, left - suspect some underlying arthritis, as well as fibro pain  Low back pain, unspecified back pain laterality, with  sciatica presence unspecified   Costochondritis, left-sided.  Given her GI complaints and limited tolerability of ibuprofen, will treat topically with voltaren gel (she has samples from Dr. Lenna Gilford).  Anxiety--EKG done today to help ease her worries.  Pain is reproducible   F/u prn

## 2014-06-22 NOTE — Patient Instructions (Signed)
Use topical Voltaren gel to the chest.  Use warm compresses  Costochondritis Costochondritis, sometimes called Tietze syndrome, is a swelling and irritation (inflammation) of the tissue (cartilage) that connects your ribs with your breastbone (sternum). It causes pain in the chest and rib area. Costochondritis usually goes away on its own over time. It can take up to 6 weeks or longer to get better, especially if you are unable to limit your activities. CAUSES  Some cases of costochondritis have no known cause. Possible causes include:  Injury (trauma).  Exercise or activity such as lifting.  Severe coughing. SIGNS AND SYMPTOMS  Pain and tenderness in the chest and rib area.  Pain that gets worse when coughing or taking deep breaths.  Pain that gets worse with specific movements. DIAGNOSIS  Your health care provider will do a physical exam and ask about your symptoms. Chest X-rays or other tests may be done to rule out other problems. TREATMENT  Costochondritis usually goes away on its own over time. Your health care provider may prescribe medicine to help relieve pain. HOME CARE INSTRUCTIONS   Avoid exhausting physical activity. Try not to strain your ribs during normal activity. This would include any activities using chest, abdominal, and side muscles, especially if heavy weights are used.  Apply ice to the affected area for the first 2 days after the pain begins.  Put ice in a plastic bag.  Place a towel between your skin and the bag.  Leave the ice on for 20 minutes, 2-3 times a day.  Only take over-the-counter or prescription medicines as directed by your health care provider. SEEK MEDICAL CARE IF:  You have redness or swelling at the rib joints. These are signs of infection.  Your pain does not go away despite rest or medicine. SEEK IMMEDIATE MEDICAL CARE IF:   Your pain increases or you are very uncomfortable.  You have shortness of breath or difficulty  breathing.  You cough up blood.  You have worse chest pains, sweating, or vomiting.  You have a fever or persistent symptoms for more than 2-3 days.  You have a fever and your symptoms suddenly get worse. MAKE SURE YOU:   Understand these instructions.  Will watch your condition.  Will get help right away if you are not doing well or get worse. Document Released: 04/19/2005 Document Revised: 04/30/2013 Document Reviewed: 02/11/2013 [facility] Patient Information 2015 [place], [place]. This information is not intended to replace advice given to you by your health care provider. Make sure you discuss any questions you have with your health care provider.

## 2014-07-06 ENCOUNTER — Ambulatory Visit (INDEPENDENT_AMBULATORY_CARE_PROVIDER_SITE_OTHER): Payer: BLUE CROSS/BLUE SHIELD | Admitting: Licensed Clinical Social Worker

## 2014-07-06 ENCOUNTER — Ambulatory Visit: Payer: Medicare Other | Admitting: Licensed Clinical Social Worker

## 2014-07-06 DIAGNOSIS — F419 Anxiety disorder, unspecified: Secondary | ICD-10-CM | POA: Diagnosis not present

## 2014-07-08 ENCOUNTER — Encounter: Payer: Self-pay | Admitting: Family Medicine

## 2014-07-08 ENCOUNTER — Other Ambulatory Visit: Payer: Self-pay | Admitting: Family Medicine

## 2014-07-08 NOTE — Telephone Encounter (Signed)
Patient called and stated that she knows that she is not due. She dropped her bottle last night in the sink and could only salvage 2 pills.

## 2014-07-08 NOTE — Telephone Encounter (Signed)
Okay to refill? 

## 2014-07-30 ENCOUNTER — Encounter: Payer: Self-pay | Admitting: Family Medicine

## 2014-07-30 ENCOUNTER — Ambulatory Visit (INDEPENDENT_AMBULATORY_CARE_PROVIDER_SITE_OTHER): Payer: Medicare Other | Admitting: Family Medicine

## 2014-07-30 VITALS — BP 130/64 | HR 48 | Temp 98.6°F | Ht 59.0 in | Wt 113.0 lb

## 2014-07-30 DIAGNOSIS — R5383 Other fatigue: Secondary | ICD-10-CM

## 2014-07-30 DIAGNOSIS — R630 Anorexia: Secondary | ICD-10-CM | POA: Diagnosis not present

## 2014-07-30 DIAGNOSIS — E039 Hypothyroidism, unspecified: Secondary | ICD-10-CM

## 2014-07-30 DIAGNOSIS — G4700 Insomnia, unspecified: Secondary | ICD-10-CM | POA: Diagnosis not present

## 2014-07-30 DIAGNOSIS — F419 Anxiety disorder, unspecified: Secondary | ICD-10-CM | POA: Diagnosis not present

## 2014-07-30 DIAGNOSIS — R61 Generalized hyperhidrosis: Secondary | ICD-10-CM | POA: Diagnosis not present

## 2014-07-30 LAB — CBC WITH DIFFERENTIAL/PLATELET
Basophils Absolute: 0.1 10*3/uL (ref 0.0–0.1)
Basophils Relative: 1 % (ref 0–1)
EOS ABS: 0.4 10*3/uL (ref 0.0–0.7)
Eosinophils Relative: 3 % (ref 0–5)
HCT: 42.4 % (ref 36.0–46.0)
HEMOGLOBIN: 14.1 g/dL (ref 12.0–15.0)
LYMPHS ABS: 2.7 10*3/uL (ref 0.7–4.0)
Lymphocytes Relative: 21 % (ref 12–46)
MCH: 29.7 pg (ref 26.0–34.0)
MCHC: 33.3 g/dL (ref 30.0–36.0)
MCV: 89.3 fL (ref 78.0–100.0)
MPV: 10.5 fL (ref 8.6–12.4)
Monocytes Absolute: 1.1 10*3/uL — ABNORMAL HIGH (ref 0.1–1.0)
Monocytes Relative: 9 % (ref 3–12)
Neutro Abs: 8.4 10*3/uL — ABNORMAL HIGH (ref 1.7–7.7)
Neutrophils Relative %: 66 % (ref 43–77)
Platelets: 298 10*3/uL (ref 150–400)
RBC: 4.75 MIL/uL (ref 3.87–5.11)
RDW: 13.5 % (ref 11.5–15.5)
WBC: 12.7 10*3/uL — AB (ref 4.0–10.5)

## 2014-07-30 LAB — COMPREHENSIVE METABOLIC PANEL
ALBUMIN: 4 g/dL (ref 3.5–5.2)
ALT: 10 U/L (ref 0–35)
AST: 19 U/L (ref 0–37)
Alkaline Phosphatase: 68 U/L (ref 39–117)
BILIRUBIN TOTAL: 0.6 mg/dL (ref 0.2–1.2)
BUN: 16 mg/dL (ref 6–23)
CALCIUM: 9.3 mg/dL (ref 8.4–10.5)
CHLORIDE: 105 meq/L (ref 96–112)
CO2: 27 mEq/L (ref 19–32)
Creat: 0.75 mg/dL (ref 0.50–1.10)
Glucose, Bld: 87 mg/dL (ref 70–99)
Potassium: 4.8 mEq/L (ref 3.5–5.3)
Sodium: 141 mEq/L (ref 135–145)
Total Protein: 6.8 g/dL (ref 6.0–8.3)

## 2014-07-30 LAB — TSH: TSH: 1.558 u[IU]/mL (ref 0.350–4.500)

## 2014-07-30 NOTE — Progress Notes (Signed)
Chief Complaint  Patient presents with  . Night Sweats    for the past 10 days she has been waking up each night and from the neck up has a cold, sweaty flush. Loss of appetite.  . Groin Pain    3 days ago had groin pain that lasted 2-3 days and felt light a brush burn-right groin.   . Hip Pain    B/L only when she is laying down at night, they feel "real prickly."  . Anxiety    feels like anxiety is just through the rough. Has been taking 1/2 of a Lexapro, not doing any good. Took a whole tablet and felt like it made her anxiety worse.    She is complaining of decreased appetite for 2.5-3 weeks, forcing herself to eat. Denies nausea or vomiting.  She has lost a few pounds.  She isn't sure if this could be related to her moods. Denies changes in bowels, always has some alternating constipation and loose stools. She uses miralax and imodium as needed. Denies any blood or mucus in the stool.  She sometimes has lower abdominal pain--related to gas or constipation.  Denies pain currently.  She denies urinary symptoms.  Night sweats are only from the neck up--described as cold flushes, different from menopausal hot flashes she used to have.  It has woken her up from sleep, occurs only at night.  She feels "something is just not right".  She denies any URI symptoms. Denies nasal congestion (just in the mornings, some swelling in her right cheek first thing only), postnasal drainage. Denies cough, shortness of breath. No fevers, chills.  Bowels are alternating between constipation and diarrhea,    Anxiety:  She still sees Marya Amsler for counseling. Alprazolam seems to be the only thing that helps--taking 1/2 tablet, sometimes a full, every day recently.  She has been taking 1/2 tablet of the Lexapro for many months.  She tried increasing to the full tablet about 3 weeks ago, took it for 1 week, but felt worse--more irritable/agitated/anxious.  She did this on her own (I was not aware) Anxiety is  worse since her husband sick with problems with his throat/cough.  Right groin pain--was like a "brush burn"--burning pain at her right groin and thigh.  She has some bulging veins in that area.  She applied heat, and symptoms have resolved.  No further pain, resolved. No leg swelling. She is having some pain in her legs for the last couple of months (since she got a new mattress).  Asking for a foot doctor to treat her callouses.  PMH, PSH, SH reviewed.  Outpatient Encounter Prescriptions as of 07/30/2014  Medication Sig Note  . ALPRAZolam (XANAX) 0.5 MG tablet TAKE 1/2 - 1 TABLET BY MOUTH DAILY AS NEEDED FOR SLEEP/ANXIETY 07/30/2014: 1/2 - 1 tablet every day recently, during the day (not at night); up to BID when anxiety is worse  . ARTIFICIAL TEAR OP Apply 1-2 drops to eye 4 (four) times daily as needed (dry eyes).    Marland Kitchen b complex vitamins capsule Take 1 capsule by mouth 2 (two) times daily.    . cholecalciferol (VITAMIN D) 1000 UNITS tablet Take 1,000 Units by mouth 2 (two) times daily.    Marland Kitchen escitalopram (LEXAPRO) 5 MG tablet Take 1/2- 1 tablet by mouth daily as directed 07/30/2014: 1/2 tablet  . SYNTHROID 50 MCG tablet Take 0.5-1 tablets (25-50 mcg total) by mouth daily before breakfast. Take half tablet Mon, Wed, Fri, full tablet  on Tues, Thurs ,Sat and Sun 04/01/2014: MWF takes 1/2 tablet, full tablet the rest of the week  . vitamin E 400 UNIT capsule Take 400 Units by mouth every Monday, Wednesday, and Friday. 3 times weekly   . polyethylene glycol (MIRALAX / GLYCOLAX) packet Take 17 g by mouth daily. 07/30/2014: Uses prn   Allergies  Allergen Reactions  . Salmon [Fish Allergy] Hives and Shortness Of Breath  . Shellfish Allergy Anaphylaxis  . Aspirin Other (See Comments)    Sever stomach pain due to ulcer scaring.  . Ciprofloxacin Diarrhea  . Codeine Nausea And Vomiting  . Darvocet [Propoxyphene N-Acetaminophen] Nausea And Vomiting  . Demerol [Meperidine] Nausea And Vomiting    Violent  vomitting  . Diphedryl [Diphenhydramine] Other (See Comments)    Increased pulse/small amount ok  . Doxycycline Hyclate Other (See Comments)    GI intolerance.  Marland Kitchen Epinephrine Other (See Comments)    Breathing problems  . Erythromycin Other (See Comments)    GI intolerance.  Yvette Rack [Cyclobenzaprine] Other (See Comments)    Tingly/prickly sensation.  . Iodine   . Keflex [Cephalexin] Hives  . Latex Other (See Comments)    Gloves ok  . Prednisone Other (See Comments)    Headache  . Pylera [Bis Subcit-Metronid-Tetracyc] Swelling    Tongue swelling. Face tingling  . Sulfa Antibiotics Other (See Comments)    Increased pulse, fainting, diarrhea, thrush  . Xylocaine [Lidocaine Hcl]     With epinephrine, given by dentist.  Speeded up heart rate and she passed out (occured twice, at dentist)  . Zoloft [Sertraline Hcl] Swelling and Other (See Comments)    Migraine Swelling of tongue/lip (09/2012)  . Advil [Ibuprofen] Rash    Motrin ok with a GI effect.  . Penicillins Hives and Rash   ROS:  See HPI for details. No bleeding, bruising, rash. No chest pain, palpitations.  PHYSICAL EXAM: BP 130/64 mmHg  Pulse 48  Temp(Src) 98.6 F (37 C) (Tympanic)  Ht 4\' 11"  (1.499 m)  Wt 113 lb (51.256 kg)  BMI 22.81 kg/m2 Pleasant female, appears slightly depressed/tired.  She is in no distress.   HEENT: PERRL, conjunctiva clear.   Neck: No lymphadenopathy, thyromegaly or mass Heart: regular rate; Frequent ectopy, with extra beats and pause sometimes as often as every other beat for stretches.  Same as prior exams Lungs: clear bilaterally Back: no CVA tenderness Abdomen: active bowel sounds. Mild epigastric tenderness (unchanged from prior exams). No masses, rebound tenderness or guarding Right hip:  FROM.  No cords.  No pain with hip flexion against resistance. No edema  ASSESSMENT/PLAN:  Other fatigue - Plan: Comprehensive metabolic panel, CBC with Differential  Night sweats - Plan: CBC  with Differential  Decreased appetite - Plan: Comprehensive metabolic panel, CBC with Differential  Anxiety - refer to psych; on minimal SSRI dose, didn't give full tab enough time. Also with insomnia related to anxiety;  med eval with psych recommended  Insomnia  Hypothyroidism, unspecified hypothyroidism type - Plan: TSH  Recent sweats, decreased appetite.  ?viral illness.  Nonspecific symptoms over all.  Check labs. Hip pain resolved prior to eval  Refer to Dr. Casimiro Needle (recommended--pt to call; also given female options, per her request, but geriatric psych is my rec). Continue counseling with Almyra Free.  Dr. Regal/[facility] recommended for treatment of her callouses.

## 2014-07-30 NOTE — Patient Instructions (Addendum)
Please call Dr. Norma Fredrickson for evaluation.  He is a psychiatrist, and I feel that he will be able to help get your anxiety under better control, as well as your insomnia.  You should continue to see Marya Amsler.  If you need his number or a referral, please let me know.    Other psychiatrists that I work with frequency include: Dr. Chucky May, Dr. Letta Moynahan (both women)  Dr. Paulla Dolly at [facility] (or any of his associates) can treat your callouses.  We will be in touch with your lab results soon.

## 2014-07-31 ENCOUNTER — Encounter: Payer: Self-pay | Admitting: Family Medicine

## 2014-08-03 ENCOUNTER — Ambulatory Visit: Payer: Medicare Other | Admitting: Licensed Clinical Social Worker

## 2014-08-03 ENCOUNTER — Telehealth: Payer: Self-pay | Admitting: Family Medicine

## 2014-08-03 ENCOUNTER — Encounter: Payer: Self-pay | Admitting: Family Medicine

## 2014-08-03 NOTE — Telephone Encounter (Signed)
It may be that she has caught a virus/cold.  These symptoms also can potentially raise the white cell count, as well as cause fevers.  I would see Dr. Benson Norway only if GI symptoms are getting worse--abdominal pain and persistent diarrhea.  She wasn't having diarrhea last week, so that might all be part of the virus.  She can give it 48 hours and see if she improves.  Avoid dairy, use imodium as needed, along with OTC cold medications to treat her URI symptoms, if needed

## 2014-08-03 NOTE — Telephone Encounter (Signed)
Pt Has not improved at all over the weekend. She started having blowing greenish mucus out of nose  & not sure if that is related to her GI symptoms. Had a bad day yesterday with diarrhea 9 times , low grade fever. Does she need to see Dr Tomi Bamberger or proceed with seeing Dr Benson Norway?

## 2014-08-03 NOTE — Telephone Encounter (Signed)
Patient advised of Dr.Knapp's recommendations. 

## 2014-08-05 ENCOUNTER — Ambulatory Visit (INDEPENDENT_AMBULATORY_CARE_PROVIDER_SITE_OTHER): Payer: Medicare Other | Admitting: Family Medicine

## 2014-08-05 ENCOUNTER — Encounter: Payer: Self-pay | Admitting: Family Medicine

## 2014-08-05 VITALS — BP 128/70 | HR 48 | Temp 97.9°F | Ht 59.0 in | Wt 112.0 lb

## 2014-08-05 DIAGNOSIS — J069 Acute upper respiratory infection, unspecified: Secondary | ICD-10-CM

## 2014-08-05 DIAGNOSIS — D72829 Elevated white blood cell count, unspecified: Secondary | ICD-10-CM | POA: Diagnosis not present

## 2014-08-05 DIAGNOSIS — R1013 Epigastric pain: Secondary | ICD-10-CM

## 2014-08-05 DIAGNOSIS — R61 Generalized hyperhidrosis: Secondary | ICD-10-CM

## 2014-08-05 DIAGNOSIS — R630 Anorexia: Secondary | ICD-10-CM | POA: Diagnosis not present

## 2014-08-05 LAB — CBC WITH DIFFERENTIAL/PLATELET
BASOS PCT: 1 % (ref 0–1)
Basophils Absolute: 0.1 10*3/uL (ref 0.0–0.1)
Eosinophils Absolute: 0.2 10*3/uL (ref 0.0–0.7)
Eosinophils Relative: 2 % (ref 0–5)
HCT: 41.3 % (ref 36.0–46.0)
Hemoglobin: 13.7 g/dL (ref 12.0–15.0)
Lymphocytes Relative: 27 % (ref 12–46)
Lymphs Abs: 2.3 10*3/uL (ref 0.7–4.0)
MCH: 29 pg (ref 26.0–34.0)
MCHC: 33.2 g/dL (ref 30.0–36.0)
MCV: 87.3 fL (ref 78.0–100.0)
MPV: 10.3 fL (ref 8.6–12.4)
Monocytes Absolute: 0.5 10*3/uL (ref 0.1–1.0)
Monocytes Relative: 6 % (ref 3–12)
NEUTROS ABS: 5.5 10*3/uL (ref 1.7–7.7)
Neutrophils Relative %: 64 % (ref 43–77)
PLATELETS: 339 10*3/uL (ref 150–400)
RBC: 4.73 MIL/uL (ref 3.87–5.11)
RDW: 13.3 % (ref 11.5–15.5)
WBC: 8.6 10*3/uL (ref 4.0–10.5)

## 2014-08-05 NOTE — Progress Notes (Signed)
Chief Complaint  Patient presents with  . Follow-up    not feeling any better. Had low grade fever Fri, Sat and Sun as well as green mucus. Has a sinus headache today. Still feeling excessively tired and her legs are still feeling weird but somewhat better. Has no appetite, cannot eat-stomach feels swollen.    Congestion and green mucus started on 1/8, frontal headache and lowgrade fever.  Headache lasted x 3 days, and still has a slight one now.  She had postnasal drainage, which she was expectorating--stringy green.  Didn't have a cough, shortness of breath.   Green mucus has resolved, none today.  She continues to have some headache.  Fever has also resolved. She has been doing sinus rinses, and mucus has cleared--no longer getting discolored drainage.  Stomach feels swollen.  Stomach feels a little more irritated than last week.  She continues to have sweats in the night, and has a warm feeling over her stomach.  Feels sweaty, clammy.  No nausea, vomiting.  No associated chest pressure, heart racing.  Occasional missed beats, but not noticing at night.  Can't get back to sleep after these episodes. So she hasn't been sleeping much, and this is a big concern to her and her husband.  She has some epigastric pain and bloating, "swelling". She has no appetite, but is forcing herself to eat/drink. Drinking water. Last bowel movement was 1/8--after she needed to take 1/2 tablet of imodium x 4 doses (total of 2 tablets).  Hasn't had a bowel movement since, but hasn't eaten much.  Today she had soup and a waffle.  Had ulcerations in her right upper mouth for the last 3 days. Hasn't gotten better or worse.  Feels irritated when eating. Has Orajel but hasn't used it.  Using tylenol prn--waits until pain is really bad, and then isn't very effective.  She scheduled appt with Dr. Casimiro Needle for next week.  PMH, PSH, SH reviewed. meds and allergies reviewed.  ROS:  See HPI. No urinary complaints, rash,  bleeding, bruising.  URI symptoms are improving. No chest pain. Husband is present today and has many questions, especially about gallbladder. Chart was reviewed. Ad nuclear med study last year which was normal.  PHYSICAL EXAM: BP 128/70 mmHg  Pulse 48  Temp(Src) 97.9 F (36.6 C) (Tympanic)  Ht 4\' 11"  (1.499 m)  Wt 112 lb (50.803 kg)  BMI 22.61 kg/m2  Pleasant female, who appears comfortable, not ill or in any distress. Accompanied by her husband. HEENT: PERRL, EOMI, conjunctiva clear.  Mildly tender to palpation over sinuses x 4 2 small shallow ulcerations on the roof of her mouth (hard palate, on the right)--only very mild erythema. No soft tissue swelling. Remainder of OP is normal. Nasal mucosa is mildly edematous, no erythema. Minimal yellow crust at distal nare on the right Neck: no lymphadenopathy, thyromegaly or mass Heart: frequent ectopy, unchanged. No murmur, rub Lungs: clear bilateraly Abdomen: soft,nondistended.  Tender over epigastrium, unchanged.  No organomegaly or mass Extremities: no edema Psych: appears tired, but otherwise normal mood, affect, hygiene and grooming  Lab Results  Component Value Date   WBC 12.7* 07/30/2014   HGB 14.1 07/30/2014   HCT 42.4 07/30/2014   MCV 89.3 07/30/2014   PLT 298 07/30/2014     Chemistry      Component Value Date/Time   NA 141 07/30/2014 1601   K 4.8 07/30/2014 1601   CL 105 07/30/2014 1601   CO2 27 07/30/2014 1601   BUN  16 07/30/2014 1601   CREATININE 0.75 07/30/2014 1601   CREATININE 0.78 07/04/2013 1045      Component Value Date/Time   CALCIUM 9.3 07/30/2014 1601   ALKPHOS 68 07/30/2014 1601   AST 19 07/30/2014 1601   ALT 10 07/30/2014 1601   BILITOT 0.6 07/30/2014 1601     Lab Results  Component Value Date   TSH 1.558 07/30/2014    ASSESSMENT/PLAN:  Elevated WBC count - Plan: CBC with Differential  Acute upper respiratory infection  Abdominal pain, epigastric  Decreased appetite  Night  sweats   Ongoing abdominal pain, now with some bloating that may be a result of anti-diarrheal use and constipation.  Reviewed bland diet in detail, avoiding dairy.  Chronic epigastric pain.  Consider f/u with Dr. Alroy Bailiff if WBC doesn't normalize, and persistent GI symptoms. Unclear why she is having clamminess/sweats in middle of night.  She can't get back to sleep after, partly related to anxiety.  May use 1/2 tablet of alprazolam (full, if needed) prn in the middle of the night--not chronically/longterm, but for now.  URI is resolving, no evidence of bacterial infection. Mouth ulcer--likely viral, no treatment needed. Avoid hard/sharp foods. Use anbesol prn.  Repeat CBC today, given elevated WBC last week. I don't expect it to be normal.  If significantly higher, than suspect some other process going on.  Consider abdominal CT.  If same or slightly high, recheck in 1-2 weeks to ensure normalizes.  F/u with Dr. Casimiro Needle as scheduled.  30 min visit, moe than 1/2 spent counseling

## 2014-08-06 ENCOUNTER — Other Ambulatory Visit: Payer: Self-pay | Admitting: Family Medicine

## 2014-08-06 ENCOUNTER — Telehealth: Payer: Self-pay | Admitting: *Deleted

## 2014-08-06 ENCOUNTER — Encounter: Payer: Self-pay | Admitting: Family Medicine

## 2014-08-06 MED ORDER — ALPRAZOLAM 0.5 MG PO TABS: 0.2500 mg | ORAL_TABLET | Freq: Every day | ORAL | Status: DC

## 2014-08-06 NOTE — Telephone Encounter (Signed)
Please reassure her.  She recently has had increase in how frequently she is taking it.  Short-term more frequent use is okay.  She can discuss her concerns about medications with the psychiatrist.  What she is currently taking is perfectly safe in the short term. I'm sending her to the psychiatrist to see if there is possibly a better regimen that will get her feeling better, and perhaps need less of the xanax.

## 2014-08-06 NOTE — Telephone Encounter (Signed)
Patient given Dr.Knapp's recommendations. She verbalized understanding.

## 2014-08-06 NOTE — Telephone Encounter (Signed)
Patient called and was very upset on the phone (crying) stating that she feels like she may be addicted to xanax. Has been taking it for 16 years and feels like it is the only thing that can relieve her anxiety. Wants to make sure that it is safe to be taken in the manner in which she has been taking for as long as she has. Please advise.

## 2014-08-10 DIAGNOSIS — R140 Abdominal distension (gaseous): Secondary | ICD-10-CM | POA: Diagnosis not present

## 2014-08-10 DIAGNOSIS — R634 Abnormal weight loss: Secondary | ICD-10-CM | POA: Diagnosis not present

## 2014-08-10 DIAGNOSIS — R197 Diarrhea, unspecified: Secondary | ICD-10-CM | POA: Diagnosis not present

## 2014-08-10 DIAGNOSIS — F411 Generalized anxiety disorder: Secondary | ICD-10-CM | POA: Diagnosis not present

## 2014-08-18 DIAGNOSIS — D1801 Hemangioma of skin and subcutaneous tissue: Secondary | ICD-10-CM | POA: Diagnosis not present

## 2014-08-18 DIAGNOSIS — L853 Xerosis cutis: Secondary | ICD-10-CM | POA: Diagnosis not present

## 2014-08-18 DIAGNOSIS — L650 Telogen effluvium: Secondary | ICD-10-CM | POA: Diagnosis not present

## 2014-08-18 DIAGNOSIS — L820 Inflamed seborrheic keratosis: Secondary | ICD-10-CM | POA: Diagnosis not present

## 2014-08-18 DIAGNOSIS — L821 Other seborrheic keratosis: Secondary | ICD-10-CM | POA: Diagnosis not present

## 2014-08-18 DIAGNOSIS — D225 Melanocytic nevi of trunk: Secondary | ICD-10-CM | POA: Diagnosis not present

## 2014-08-18 DIAGNOSIS — L814 Other melanin hyperpigmentation: Secondary | ICD-10-CM | POA: Diagnosis not present

## 2014-08-18 DIAGNOSIS — L570 Actinic keratosis: Secondary | ICD-10-CM | POA: Diagnosis not present

## 2014-08-26 ENCOUNTER — Other Ambulatory Visit: Payer: Self-pay | Admitting: Gastroenterology

## 2014-08-26 DIAGNOSIS — R634 Abnormal weight loss: Secondary | ICD-10-CM | POA: Diagnosis not present

## 2014-08-26 DIAGNOSIS — N390 Urinary tract infection, site not specified: Secondary | ICD-10-CM | POA: Diagnosis not present

## 2014-08-26 DIAGNOSIS — R197 Diarrhea, unspecified: Secondary | ICD-10-CM | POA: Diagnosis not present

## 2014-08-26 DIAGNOSIS — R140 Abdominal distension (gaseous): Secondary | ICD-10-CM | POA: Diagnosis not present

## 2014-08-26 DIAGNOSIS — R5383 Other fatigue: Secondary | ICD-10-CM | POA: Diagnosis not present

## 2014-09-03 ENCOUNTER — Other Ambulatory Visit: Payer: Self-pay | Admitting: Rheumatology

## 2014-09-03 DIAGNOSIS — M810 Age-related osteoporosis without current pathological fracture: Secondary | ICD-10-CM | POA: Diagnosis not present

## 2014-09-03 DIAGNOSIS — M545 Low back pain: Secondary | ICD-10-CM | POA: Diagnosis not present

## 2014-09-03 DIAGNOSIS — M797 Fibromyalgia: Secondary | ICD-10-CM | POA: Diagnosis not present

## 2014-09-03 DIAGNOSIS — M329 Systemic lupus erythematosus, unspecified: Secondary | ICD-10-CM | POA: Diagnosis not present

## 2014-09-03 DIAGNOSIS — M25561 Pain in right knee: Secondary | ICD-10-CM

## 2014-09-03 LAB — HM DEXA SCAN

## 2014-09-04 ENCOUNTER — Ambulatory Visit
Admission: RE | Admit: 2014-09-04 | Discharge: 2014-09-04 | Disposition: A | Discharge status: Home / Self Care | Payer: Medicare Other | Source: Ambulatory Visit | Attending: Gastroenterology | Admitting: Gastroenterology

## 2014-09-04 DIAGNOSIS — N281 Cyst of kidney, acquired: Secondary | ICD-10-CM | POA: Diagnosis not present

## 2014-09-04 DIAGNOSIS — R634 Abnormal weight loss: Secondary | ICD-10-CM

## 2014-09-07 ENCOUNTER — Encounter: Payer: Self-pay | Admitting: Family Medicine

## 2014-09-08 DIAGNOSIS — I83813 Varicose veins of bilateral lower extremities with pain: Secondary | ICD-10-CM | POA: Diagnosis not present

## 2014-09-10 DIAGNOSIS — I83813 Varicose veins of bilateral lower extremities with pain: Secondary | ICD-10-CM | POA: Diagnosis not present

## 2014-09-14 ENCOUNTER — Other Ambulatory Visit: Payer: Self-pay | Admitting: Family Medicine

## 2014-09-14 NOTE — Telephone Encounter (Signed)
Okay for #10, and maybe call over to their office and let them know that since Dr. Casimiro Needle is treating her for her anxiety, we thought it best that he be aware of her usage and provide her rx in future.

## 2014-09-14 NOTE — Telephone Encounter (Signed)
Last given #30 on 1/14.  She was referred to psych.  If seeing psych, she should be getting refills through them. Please check with patient.

## 2014-09-14 NOTE — Telephone Encounter (Signed)
Spoke with patient and she is seeing Dr.Plovsky, he gave her prozac but not any alprazolam. I told her that she would need to call them to rx. She states that will take 4-5 days to get done. When she calls there it is very hard to get hold of them and it usually takes 4-5 days until they respond to her. She wants to know if you can call in a few days worth to hold her until she can get in touch with them . Please advise.

## 2014-09-14 NOTE — Telephone Encounter (Signed)
Is this okay to refill? 

## 2014-09-16 ENCOUNTER — Other Ambulatory Visit (HOSPITAL_COMMUNITY): Payer: Self-pay | Admitting: Obstetrics and Gynecology

## 2014-09-16 ENCOUNTER — Telehealth: Payer: Self-pay | Admitting: *Deleted

## 2014-09-16 DIAGNOSIS — R109 Unspecified abdominal pain: Secondary | ICD-10-CM | POA: Diagnosis not present

## 2014-09-16 DIAGNOSIS — R1084 Generalized abdominal pain: Secondary | ICD-10-CM | POA: Diagnosis not present

## 2014-09-16 DIAGNOSIS — R1030 Lower abdominal pain, unspecified: Secondary | ICD-10-CM

## 2014-09-16 NOTE — Telephone Encounter (Signed)
Error

## 2014-09-18 ENCOUNTER — Ambulatory Visit (HOSPITAL_COMMUNITY)
Admission: RE | Admit: 2014-09-18 | Discharge: 2014-09-18 | Disposition: A | Discharge status: Home / Self Care | Payer: Medicare Other | Source: Ambulatory Visit | Attending: Obstetrics and Gynecology | Admitting: Obstetrics and Gynecology

## 2014-09-18 DIAGNOSIS — N839 Noninflammatory disorder of ovary, fallopian tube and broad ligament, unspecified: Secondary | ICD-10-CM | POA: Diagnosis not present

## 2014-09-18 DIAGNOSIS — R1030 Lower abdominal pain, unspecified: Secondary | ICD-10-CM | POA: Diagnosis not present

## 2014-09-18 DIAGNOSIS — K5710 Diverticulosis of small intestine without perforation or abscess without bleeding: Secondary | ICD-10-CM | POA: Insufficient documentation

## 2014-09-18 DIAGNOSIS — I700 Atherosclerosis of aorta: Secondary | ICD-10-CM | POA: Insufficient documentation

## 2014-09-21 DIAGNOSIS — I83813 Varicose veins of bilateral lower extremities with pain: Secondary | ICD-10-CM | POA: Diagnosis not present

## 2014-09-24 DIAGNOSIS — R102 Pelvic and perineal pain: Secondary | ICD-10-CM | POA: Diagnosis not present

## 2014-09-24 DIAGNOSIS — N8320 Unspecified ovarian cysts: Secondary | ICD-10-CM | POA: Diagnosis not present

## 2014-09-28 DIAGNOSIS — F411 Generalized anxiety disorder: Secondary | ICD-10-CM | POA: Diagnosis not present

## 2014-09-29 DIAGNOSIS — M25652 Stiffness of left hip, not elsewhere classified: Secondary | ICD-10-CM | POA: Diagnosis not present

## 2014-09-29 DIAGNOSIS — M7062 Trochanteric bursitis, left hip: Secondary | ICD-10-CM | POA: Diagnosis not present

## 2014-09-29 DIAGNOSIS — M25552 Pain in left hip: Secondary | ICD-10-CM | POA: Diagnosis not present

## 2014-09-30 ENCOUNTER — Encounter: Payer: Medicare Other | Admitting: Family Medicine

## 2014-10-04 ENCOUNTER — Ambulatory Visit
Admission: RE | Admit: 2014-10-04 | Discharge: 2014-10-04 | Disposition: A | Discharge status: Home / Self Care | Payer: Medicare Other | Source: Ambulatory Visit | Attending: Rheumatology | Admitting: Rheumatology

## 2014-10-04 DIAGNOSIS — M25561 Pain in right knee: Secondary | ICD-10-CM

## 2014-10-05 ENCOUNTER — Telehealth: Payer: Self-pay | Admitting: Internal Medicine

## 2014-10-05 ENCOUNTER — Telehealth: Payer: Self-pay

## 2014-10-05 DIAGNOSIS — R102 Pelvic and perineal pain: Secondary | ICD-10-CM | POA: Diagnosis not present

## 2014-10-05 DIAGNOSIS — R3915 Urgency of urination: Secondary | ICD-10-CM | POA: Diagnosis not present

## 2014-10-05 DIAGNOSIS — R390 Extravasation of urine: Secondary | ICD-10-CM | POA: Diagnosis not present

## 2014-10-05 DIAGNOSIS — R5383 Other fatigue: Secondary | ICD-10-CM | POA: Diagnosis not present

## 2014-10-05 DIAGNOSIS — N8329 Other ovarian cysts: Secondary | ICD-10-CM | POA: Diagnosis not present

## 2014-10-05 DIAGNOSIS — N390 Urinary tract infection, site not specified: Secondary | ICD-10-CM | POA: Diagnosis not present

## 2014-10-05 NOTE — Telephone Encounter (Signed)
Rec'd from [facility] forward 12 pages to Dr. Olevia Perches

## 2014-10-05 NOTE — Telephone Encounter (Signed)
Rec'd from [facility] forward 12 pages to Historical Provider

## 2014-10-06 ENCOUNTER — Encounter: Payer: Self-pay | Admitting: Gastroenterology

## 2014-10-12 ENCOUNTER — Encounter: Payer: Self-pay | Admitting: Gastroenterology

## 2014-10-12 ENCOUNTER — Ambulatory Visit (INDEPENDENT_AMBULATORY_CARE_PROVIDER_SITE_OTHER): Payer: Medicare Other | Admitting: Gastroenterology

## 2014-10-12 ENCOUNTER — Telehealth: Payer: Self-pay | Admitting: Gastroenterology

## 2014-10-12 VITALS — BP 128/78 | HR 66 | Ht 59.0 in | Wt 109.0 lb

## 2014-10-12 DIAGNOSIS — R1906 Epigastric swelling, mass or lump: Secondary | ICD-10-CM | POA: Insufficient documentation

## 2014-10-12 DIAGNOSIS — R6881 Early satiety: Secondary | ICD-10-CM | POA: Diagnosis not present

## 2014-10-12 NOTE — Progress Notes (Signed)
10/12/2014 Tracy Malone [id number] 1932-05-19   HISTORY OF PRESENT ILLNESS:  This is an 81 year old female who has been followed by Dr. Benson Norway for the past 3 or 4 years, and was just seen by him in February, however, was referred here by her gynecologist, Dr. Helane Rima for evaluation of abdominal pain. The patient had her records transferred here from Dr. Benson Norway, and they were apparently reviewed by and accepted by Dr. Ardis Hughs. Patient was initially scheduled to see him in May for complaints of abdominal pain, however, called today to have her appointment moved sooner and was placed on my schedule urgently.  She comes in today with complaints of abdominal pain, nausea, fatigue, poor appetite, and weight loss of 8 pounds that she says has been present since December, however, after reviewing her records it appears that she's had these complaints dating back to at least 02/2014 as well as other GI complaints in the past that have undergone extensive evaluation.  This is listed below.  She complains of upper abdominal fullness and pressure when she eats too much, which she tries not to do.  Because of this her appetite has been poor and she has lost 8 pounds since 02/2014.    --CBC, CMP, and TSH were normal in 07/2014 and again in 08/2014.   --CT scan of the abdomen and pelvis without contrast was performed on 09/18/2014. This was done without contrast due to dye allergy.  This showed no acute findings in the abdomen and pelvis specifically to explain her history of abdominal pain. There is a 3.0 cm left ovarian cystic lesion that is likely benign. As I stated above, she is following with gynecology and they're going to monitor that, but they think that it is benign.  Stomach, duodenum, small bowel, and colon grossly unremarkable.  --Abdominal ultrasound on 09/04/2014 showed no acute or chronic abnormalities of the hepatobiliary tree; evaluation of the pancreatic tail was limited. There is no acute  abnormalities of the kidney demonstrated. A 1.6 cm diameter mid to lower pole cyst was noted in the right kidney.  --August 2015 she had a nuclear med HIDA scan with [facility], which was normal.  This was performed for complaints of upper abdominal pain, appetite loss, fatigue, and weight loss at that time as well.  --Her last EGD was in December 2013 at which time she was found to have moderate gastritis. Biopsies actually showed H. Pylori with chronic active gastritis, however, after taking 1 dose of the Pylera medication she discontinued it because of side effects (it made her feel horrible).  Therefore, she has not completed treatment for H. Pylori. Duodenal biopsies were unremarkable at that time.  --She had a flexible sigmoidoscopy also in December 2013 that was normal with only medium hemorrhoids noted. This was performed for diarrhea and biopsies of the descending and sigmoid colon were unremarkable. She states that she had a full colonoscopy prior to that with Dr. Benson Norway, however, after calling his office they stated they have never performed a full colonoscopy on her.  --Gastric emptying scan in January 2014 showed 33% of the ingested activity remaining in the stomach which is mildly abnormal compared to the normal range of less than 30% of the ingested activity remaining at 120 minutes.  Just of note, she has listed 22 medication allergies/intolerances.   Past Medical History  Diagnosis Date  . Depression     treated in the past for years;stopped in 2010 for a years  . Panic  attack   . Claustrophobia   . Fibromyalgia   . IBS (irritable bowel syndrome)     Dr. Benson Norway  . Hypercholesteremia     with high HDL  . Glaucoma, narrow-angle     s/p laser surgery  . Duodenal ulcer 1962    h/o  . Hyperthyroidism     h/o x 2 years  . Shingles 1999    h/o  . Hypothyroid 9/08  . GERD (gastroesophageal reflux disease)   . Trochanteric bursitis 12/2008    bilateral  . Superficial thrombophlebitis  03/2009    RLE  . Bell's palsy 1966  . Osteoporosis 10/11    Dr.Hawkes  . SLE (systemic lupus erythematosus)     with arthralgia/myalgia, positive ANA centromere pattern, positive DNA, positive RNP, indeterminant anti-cardiolipin antibody-Dr.Hawkes.  . Carotid artery disease 2010    on vascular screening;unchanged 2013.(could not tolerate simvastatin, no other statins tried)--<30% blockage bilat 07/2011  . Recurrent UTI     has cystocele-Dr.Grewal  . Chronic fatigue and malaise   . Chronic abdominal pain   . Frequent PVCs 07/2012    Seen by [facility]: benign, asymptomatic, normal EF   Past Surgical History  Procedure Laterality Date  . Tonsillectomy  1946  . Vaginal hysterectomy  1971    and bladder repair.  Still has ovaries  . Cataract extraction, bilateral  1995, 1996  . Thyroidectomy, partial  09/2005    L nodule; Dr. Harlow Asa  . Upper gi endoscopy  06/27/12  . Flexible sigmoidoscopy      reports that she has never smoked. She has never used smokeless tobacco. She reports that she does not drink alcohol or use illicit drugs. family history includes Cancer in her brother; Diabetes in her maternal grandfather; Heart disease in her brother and mother; Hypertension in her mother and sister. Allergies  Allergen Reactions  . Salmon [Fish Allergy] Hives and Shortness Of Breath  . Shellfish Allergy Anaphylaxis  . Aspirin Other (See Comments)    Sever stomach pain due to ulcer scaring.  . Ciprofloxacin Diarrhea  . Codeine Nausea And Vomiting  . Darvocet [Propoxyphene N-Acetaminophen] Nausea And Vomiting  . Demerol [Meperidine] Nausea And Vomiting    Violent vomitting  . Diphedryl [Diphenhydramine] Other (See Comments)    Increased pulse/small amount ok  . Doxycycline Hyclate Other (See Comments)    GI intolerance.  Marland Kitchen Epinephrine Other (See Comments)    Breathing problems  . Erythromycin Other (See Comments)    GI intolerance.  Yvette Rack [Cyclobenzaprine] Other (See Comments)     Tingly/prickly sensation.  . Iodine   . Keflex [Cephalexin] Hives  . Latex Other (See Comments)    Gloves ok  . Prednisone Other (See Comments)    Headache  . Pylera [Bis Subcit-Metronid-Tetracyc] Swelling    Tongue swelling. Face tingling  . Sulfa Antibiotics Other (See Comments)    Increased pulse, fainting, diarrhea, thrush  . Xylocaine [Lidocaine Hcl]     With epinephrine, given by dentist.  Speeded up heart rate and she passed out (occured twice, at dentist)  . Zoloft [Sertraline Hcl] Swelling and Other (See Comments)    Migraine Swelling of tongue/lip (09/2012)  . Advil [Ibuprofen] Rash    Motrin ok with a GI effect.  . Penicillins Hives and Rash      Outpatient Encounter Prescriptions as of 10/12/2014  Medication Sig  . ARTIFICIAL TEAR OP Apply 1-2 drops to eye 4 (four) times daily as needed (dry eyes).   Marland Kitchen b  complex vitamins capsule Take 1 capsule by mouth 2 (two) times daily.   . cholecalciferol (VITAMIN D) 1000 UNITS tablet Take 1,000 Units by mouth 2 (two) times daily.   . polyethylene glycol (MIRALAX / GLYCOLAX) packet Take 17 g by mouth daily.  Marland Kitchen SYNTHROID 50 MCG tablet Take 0.5-1 tablets (25-50 mcg total) by mouth daily before breakfast. Take half tablet Mon, Wed, Fri, full tablet on Tues, Thurs ,Sat and Sun  . temazepam (RESTORIL) 7.5 MG capsule Take 7.5 mg by mouth at bedtime as needed for sleep.  . vitamin E 400 UNIT capsule Take 400 Units by mouth every Monday, Wednesday, and Friday. 3 times weekly  . [DISCONTINUED] ALPRAZolam (XANAX) 0.5 MG tablet TAKE 1/2 TO 1 TABLET BY MOUTH DAILY AS NEEDED FOR ANXIETY (Patient not taking: Reported on 10/12/2014)  . [DISCONTINUED] escitalopram (LEXAPRO) 5 MG tablet Take 1/2- 1 tablet by mouth daily as directed (Patient not taking: Reported on 10/12/2014)     REVIEW OF SYSTEMS  : All other systems reviewed and negative except where noted in the History of Present Illness.   PHYSICAL EXAM: BP 128/78 mmHg  Pulse 66  Ht 4'  11" (1.499 m)  Wt 109 lb (49.442 kg)  BMI 22.00 kg/m2 General: Well developed white female in no acute distress Head: Normocephalic and atraumatic Eyes:  Sclerae anicteric, conjunctiva pink. Ears: Normal auditory acuity Lungs: Clear throughout to auscultation Heart: Regular rate and rhythm Abdomen: Soft, non-distended.  Normal bowel sounds.  Mild epigastric TTP without R/R/G.  Musculoskeletal: Symmetrical with no gross deformities  Skin: No lesions on visible extremities Extremities: No edema  Neurological: Alert oriented x 4, grossly non-focal Psychological:  Alert and cooperative. Normal mood and affect  ASSESSMENT AND PLAN: -Post-prandial epigastric fullness/bloating and early satiety with resulting poor appetite and weight loss of 8 pounds:  She had borderline abnormal GES in 07/2012.  I wonder if she has worsening delayed emptying that could be causing her symptoms.  Will repeat GES for comparison.  Also, we know that she has Hpylori that has not been treated due to her extensive medication allergy/intolerance list.  I have suggested that we try treating her with Prevpac (BID PPI, flagyl, and clarithromycin; she thinks that she has taken flagyl without issues in the past and has erythromycin as having "GI intolerance" so not allergy, which means that we could try the clarithromycin).  She declined and would like to see the results of the GES first.  I think that her symptoms may be functional and possibly related to some anxiety/depression.  *She still has an appointment with Dr. Ardis Hughs in May, which we have left on the schedule for now as well.     CC:  [name], MD  CC:  Dr. Dian Queen

## 2014-10-12 NOTE — Patient Instructions (Addendum)
You have been scheduled for a gastric emptying scan at [facility] on 10-22-14  At 10:45 . Please arrive at least 15 minutes prior to your appointment for registration. Please make certain not to have anything to eat or drink after midnight the night before your test. Hold all stomach medications (ex: Zofran, phenergan, Reglan) 48 hours prior to your test. If you need to reschedule your appointment, please contact radiology scheduling at [phone]. _____________________________________________________________________ A gastric-emptying study measures how long it takes for food to move through your stomach. There are several ways to measure stomach emptying. In the most common test, you eat food that contains a small amount of radioactive material. A scanner that detects the movement of the radioactive material is placed over your abdomen to monitor the rate at which food leaves your stomach. This test normally takes about 2 hours to complete. _____________________________________________________________________

## 2014-10-12 NOTE — Telephone Encounter (Signed)
Pt states she is having abdominal pain, weakness, and wt loss. Pt has OV scheduled with Dr. Ardis Hughs in May but does not think she can wait until then. Pt scheduled to see Alonza Bogus PA today at 2:30pm. Pt aware of appt.

## 2014-10-13 NOTE — Progress Notes (Signed)
Jess, thanks for seeing her for this urgent appt.  I agree with your plan, note.  Will see her in May as previously scheduled.

## 2014-10-14 DIAGNOSIS — F411 Generalized anxiety disorder: Secondary | ICD-10-CM | POA: Diagnosis not present

## 2014-10-22 ENCOUNTER — Ambulatory Visit (INDEPENDENT_AMBULATORY_CARE_PROVIDER_SITE_OTHER): Payer: Medicare Other | Admitting: Family Medicine

## 2014-10-22 ENCOUNTER — Encounter: Payer: Self-pay | Admitting: Family Medicine

## 2014-10-22 ENCOUNTER — Ambulatory Visit (HOSPITAL_COMMUNITY)
Admission: RE | Admit: 2014-10-22 | Discharge: 2014-10-22 | Disposition: A | Discharge status: Home / Self Care | Payer: Medicare Other | Source: Ambulatory Visit | Attending: Gastroenterology | Admitting: Gastroenterology

## 2014-10-22 VITALS — BP 140/60 | HR 60 | Ht 59.0 in | Wt 109.6 lb

## 2014-10-22 DIAGNOSIS — G8929 Other chronic pain: Secondary | ICD-10-CM

## 2014-10-22 DIAGNOSIS — R1013 Epigastric pain: Secondary | ICD-10-CM

## 2014-10-22 DIAGNOSIS — R6881 Early satiety: Secondary | ICD-10-CM | POA: Diagnosis not present

## 2014-10-22 DIAGNOSIS — K589 Irritable bowel syndrome without diarrhea: Secondary | ICD-10-CM | POA: Insufficient documentation

## 2014-10-22 DIAGNOSIS — K219 Gastro-esophageal reflux disease without esophagitis: Secondary | ICD-10-CM | POA: Insufficient documentation

## 2014-10-22 DIAGNOSIS — R1906 Epigastric swelling, mass or lump: Secondary | ICD-10-CM | POA: Diagnosis not present

## 2014-10-22 DIAGNOSIS — R140 Abdominal distension (gaseous): Secondary | ICD-10-CM | POA: Diagnosis not present

## 2014-10-22 MED ORDER — TECHNETIUM TC 99M SULFUR COLLOID
2.0000 | Freq: Once | INTRAVENOUS | Status: AC | PRN
  Administered 2014-10-22: 2 via INTRAVENOUS

## 2014-10-22 NOTE — Progress Notes (Signed)
Chief Complaint  Patient presents with  . Edema    swelling of the stomach. Seeing Dr.Jacobs 12/04/14, but in the meantime was told to see her PCP if she was having any problems. She states that after she eats her stomach swells up and she feels bloated. It is making her very very weak. Would like to know if you would order fecal antigen test for H Pylori?   Patient presents with complaint of ongoing swelling and epigastric pain after eating.  It makes her feel weak, wiped out "like a limp rag". Feels wiped out most of the day.  She is eating 4x/day, small amounts, sometimes an additional snack in the evening. Symptoms start an hour after eating breakfast.  This has caused some weight loss.  This has been going on since January. The last visit with Dr. Benson Norway she was told "I'm out of options".  This disappointed her.  She was supposed to be referred to [facility] for second opinion, but never got a call about an appointment. She had seen her GYN, as she was having some lower abdominal pain, cyst, and was ultimately referred to Dr. Edison Nasuti at [facility]. She was seen there 10 days ago.  Full history was reviewed with patient today, re-reading back history from GI's note with the patient, who agrees with the history.  It had been suggested to retreat for H.Pylori, but patient declined presumptive treatment (given her poor tolerance of meds).  She is asking about what other tests could be done to confirm if there is ongoing infection.  She had a gastric emptying study this morning.  She tells me today (never in the past) that she preivously was diagnosed with "spastic colon". She now states that the pain back then (over 10 years ago when she lived in [place]) her symptoms were similar to her discomfort now--bloating and weakness after eating.  She recalls being given some phenobarbital drops which she used before eating and it helped. (of note, she frequently has recalled things inaccurately, when trying to recall which  anti-depressants she had tried, and what side effects she had, asking to re-try meds that she thinks worked for her, when in fact chart was reviewed and documentation was found proving prior intolerance)  She had been referred to Dr. Casimiro Needle to treat her anxiety/depression, especially given her intolerance to so many medications. Dr. Casimiro Needle tried her on two different anti-depressants.  She was re-tried on prozac 10mg  for about a month--felt worse.  She felt bad, so was then changed to Cymbalta--she couldn't concentrate, it affected her memory, worsening depression, cried all the time.  She took it for about a month. She was told she didn't have depression, just anxiety. She remains on BID alprazolam, and was started on sleeping pills.  She is taking temazepam to help her sleep.  She states she was given a new prescription for 15mg , but she hasn't increased the dose. She sleeps until 3am with the 7.5mg  dose. She will take a second pill if she wakes up 2-3am. She was changed to a 15mg  dose to take at bedtime and states she tried that for a bit, but was still waking up 2-4am , so went back to 7.5 each night.  She was upset that Dr. Karen Chafe records were never sent to me. Discussed HIPPA, and that unless specifically asked, psychiatrists do not usually send each visit's notes.  She could ask him to do so if she wants.   Hasn't had diarrhea in the last 4  months. This had been an ongoing problem for her, for which she had seen Dr. Benson Norway.  No longer a problems.  Currently she uses miralax once daily, and bowels are well controlled.  PMH, PSH, SH were reviewed and updated. Outpatient Encounter Prescriptions as of 10/22/2014  Medication Sig Note  . ALPRAZolam (XANAX) 0.5 MG tablet Take 0.5 mg by mouth 2 (two) times daily. 10/22/2014: Takes 1/2 tablet twice daily  . ARTIFICIAL TEAR OP Apply 1-2 drops to eye 4 (four) times daily as needed (dry eyes).    Marland Kitchen b complex vitamins capsule Take 1 capsule by mouth 2 (two)  times daily.    . cholecalciferol (VITAMIN D) 1000 UNITS tablet Take 1,000 Units by mouth 2 (two) times daily.    . polyethylene glycol (MIRALAX / GLYCOLAX) packet Take 17 g by mouth daily. 10/22/2014: Uses daily  . SYNTHROID 50 MCG tablet Take 0.5-1 tablets (25-50 mcg total) by mouth daily before breakfast. Take half tablet Mon, Wed, Fri, full tablet on Tues, Thurs ,Sat and Sun 04/01/2014: MWF takes 1/2 tablet, full tablet the rest of the week  . temazepam (RESTORIL) 7.5 MG capsule Take 7.5 mg by mouth at bedtime as needed for sleep.   . vitamin E 400 UNIT capsule Take 400 Units by mouth every Monday, Wednesday, and Friday. 3 times weekly    Allergies  Allergen Reactions  . Salmon [Fish Allergy] Hives and Shortness Of Breath  . Shellfish Allergy Anaphylaxis  . Aspirin Other (See Comments)    Sever stomach pain due to ulcer scaring.  . Ciprofloxacin Diarrhea  . Codeine Nausea And Vomiting  . Darvocet [Propoxyphene N-Acetaminophen] Nausea And Vomiting  . Demerol [Meperidine] Nausea And Vomiting    Violent vomitting  . Diphedryl [Diphenhydramine] Other (See Comments)    Increased pulse/small amount ok  . Doxycycline Hyclate Other (See Comments)    GI intolerance.  Marland Kitchen Epinephrine Other (See Comments)    Breathing problems  . Erythromycin Other (See Comments)    GI intolerance.  Yvette Rack [Cyclobenzaprine] Other (See Comments)    Tingly/prickly sensation.  . Iodine   . Keflex [Cephalexin] Hives  . Latex Other (See Comments)    Gloves ok  . Prednisone Other (See Comments)    Headache  . Pylera [Bis Subcit-Metronid-Tetracyc] Swelling    Tongue swelling. Face tingling  . Sulfa Antibiotics Other (See Comments)    Increased pulse, fainting, diarrhea, thrush  . Xylocaine [Lidocaine Hcl]     With epinephrine, given by dentist.  Speeded up heart rate and she passed out (occured twice, at dentist)  . Zoloft [Sertraline Hcl] Swelling and Other (See Comments)    Migraine Swelling of  tongue/lip (09/2012)  . Advil [Ibuprofen] Rash    Motrin ok with a GI effect.  . Penicillins Hives and Rash   ROS:  +weight loss--see flowsheet. No headaches, dizziness.  +fatigue. Bowels are normal. No nausea, vomiting.  +abdominal pain and bloating after eating, as per HPI.  No rashes, bleeding, bruising, chest pain, DOE, palpitations.  Denies URI symptoms, cough.  See HPI.   PHYSICAL EXAM: BP 140/60 mmHg  Pulse 60  Ht 4\' 11"  (1.499 m)  Wt 109 lb 9.6 oz (49.714 kg)  BMI 22.12 kg/m2 Elderly female in no distress.  Appears tired, somewhat frustrated, but in no acute distress HEENT: PERRL, EOMI, conjunctiva clear.  OP clear with moist mucus membranes. Neck; no lymphadenopathy, thyromegaly or mass Heart: regular rate and rhythm with occasional ectopy (every 3-4 beats), unchanged. No  murmur Lungs: clear bilaterally Back: no spinal or CVA tenderness Abdomen: +diffuse epigastric tenderness.  Active bowel sounds in this area.  No rebound tenderness or guarding, no mass.  Negative Murphy sign. No masses.   Extremities: no edema Skin: no rash, normal turgor Psych: appears somewhat down/frustrated.  Not anxious appearing.  ASSESSMENT/PLAN:  Abdominal pain, chronic, epigastric  Counseled extensively re: workup, the meaning of negative findings (ruling out many causes, even though cause of her pain not yet found). Discussed functional pain, vs other sources of pain. Discussed why her case is challenging, and why Dr. Benson Norway said what he did--he didn't know what other options there were, so referring to a specialist for additional eval and opinion is a good idea.  Unfortunately, she never had appointment made.  I agree with second opinion from Dr. Eugenia Pancoast office.  Gastric emptying study from this morning is pending.  The information about spastic colon and phenobarb was never mentioned either to me or other GI docs.  Maybe that needs to be investigated further in future (using other types of meds as  a trial for pain). Rather than starting new medications at this point, given her intolerance to so many, I recommended re-try of gluten free diet (and remaining lactose-free).  She has had negative testing for celiac, but perhaps there might be an intolerance that is causing her symptoms (rather than true allergy).  Worth a try, with nothing to lose.    Trial of 1-2 weeks of gluten-free and lactose-free diet (already mostly lactose-free now). Await test results from this morning--you should hear GI doctors office with those results soon. If there is an abnormality, they should recommend a treatment.  If it is normal, and your symptoms don't improve with this dietary trial, then we may need to look further to see what else could be causing your symptoms. You should discuss with them the possibility of anti-spasmodics, and let them know the history you just told me (about phenobarb previously helping). You should also consider the possibility (and discuss with GI) for another endoscopy, since the one true abnormal finding that you have had has been gastritis and a +H.pylori test (2013).    50 minute visit, more than 1/2 spent counseling and answering questions.

## 2014-10-22 NOTE — Patient Instructions (Signed)
Trial of 1-2 weeks of gluten-free and lactose-free diet (already mostly lactose-free now). Await test results from this morning--you should hear GI doctors office with those results soon. If there is an abnormality, they should recommend a treatment.  If it is normal, and your symptoms don't improve with this dietary trial, then we may need to look further to see what else could be causing your symptoms. You should discuss with them the possibility of anti-spasmodics, and let them know the history you just told me (about phenobarb previously helping). You should also consider the possibility (and discuss with GI) for another endoscopy, since the one true abnormal finding that you have had has been gastritis and a +H.pylori test (2013).     Gluten-Free Diet for Celiac Disease Gluten is a protein found in wheat, rye, barley, and triticale (a cross between wheat and rye) grains. People with celiac disease need to have a gluten-free diet. With celiac disease, gluten interferes with the absorption of food and may also cause intestinal injury.  Strict compliance is important even during symptom-free periods. This means eliminating all foods with gluten from your diet permanently. This requires some significant changes but is very manageable. WHAT DO I NEED TO KNOW ABOUT A GLUTEN-FREE DIET?  Look for items labeled with "GF." Looking for GF will make it easier to identify products that are safe to eat.  Read all labels. Gluten may have been added as a minor ingredient where least expected, such as in shredded cheeses or ice creams. Always check food labels and investigate questionable ingredients. Talk to your dietitian or health care provider if you have questions about certain foods or need help finding GF foods.  Check when in doubt. If you are not sure whether an ingredient contains gluten, check with the manufacturer. Note that some manufacturers may change ingredients without notice. Always read  labels.   Know how food is prepared. Since flour and cereal products are often used in the preparation of foods, it is important to be aware of the methods of preparation used, as well as the ingredients in the foods themselves. This is especially true when you are dining out. Ask restaurants if they have a gluten-free menu.  Watch for cross-contamination. Cross-contamination occurs when gluten-free foods come into contact with foods that contain gluten. It often happens during the manufacturing process. Always check the ingredient list and for warnings on packages, such as "may contain gluten."  Eat a balanced diet. It is important to still get enough fiber, iron, and B vitamins in your diet. Look for enriched whole grain gluten-free products and continue to eat a well-balanced diet of the important non-grain items, such as vegetables, fruit, lean proteins, legumes, and dairy.  Consider taking a gluten-free multivitamin and mineral supplement. Discuss this with your health care provider. WHAT KEY WORDS HELP IDENTIFY GLUTEN? Know key words to help identify gluten. A dietitian can help you identify possible harmful ingredients in the foods you normally eat. Words to check for on food labels include:   Flour, enriched flour, bromated flour, white flour, durum flour, graham flour, phosphated flour, self-rising flour, semolina, or farina.  Starch, dextrin, modified food starch, or cereal.  Thickening, fillers, or emulsifiers.  Any kind of malt flavoring, extract, or syrup (malt is made from barley and includes malt vinegar, malted milk, and malted beverages).  Hydrolyzed vegetable protein. WHAT FOODS CAN I EAT? Below is a list of common foods that are allowed with a gluten-free diet.  Grains Products  made from the following flours or grains:amaranth,bean flours, 100% buckwheat flour, corn, millet, nut flours or meals, GF oats, quinoa, rice, sorghum, teff, any all-purpose 100% GF flour mix,  rice wafers, pure cornmeal tortillas, popcorn, some crackers, some chips, and hot cereals made from cornmeal. Ask your dietitian which specific hot and cold cereals are allowed. Hominy, rice or wild rice, and special GF pasta. Some Asian rice noodles or bean noodles. Arrowroot starch, corn bran, corn flour, corn germ, cornmeal, corn starch, potato flour, potato starch flour, and rice bran. Rice flours: plain, brown, and sweet. Rice polish, soy flour, tapioca starch. Vegetables All plain, fresh, frozen, or canned vegetables.  Fruits All fresh, frozen, canned, dried fruits, and fruit juices.  Meats and Other Protein Foods Meat, fish, poultry, or eggs prepared without added wheat, rye, barley, or triticale. Some luncheon meat and some frankfurters. Pure meat. All aged cheese, most processed cheese products, some cottage cheese, and some cream cheese. Dried beans, dried peas, and lentils.  Dairy Milk and yogurt made with allowed ingredients.  Beverages Coffee (regular or decaffeinated), tea, herbal tea (read label to be sure that no wheat flour has been added). Carbonated beverages and some root beers. Wine, sake, and distilled spirits, such as gin, vodka, and whiskey. GF beers and GF ciders.  Sweetsand Desserts Sugar, honey, some syrups, molasses, jelly, jam, plain hard candy, marshmallows, gumdrops, homemade candies free of wheat, rye, barley, or triticale. Coconut. Custard, some pudding mixes, and homemade puddings from cornstarch, rice, and tapioca. Gelatin desserts, sorbets, frozen ice pops, and sherbet. Cake, cookies, and other desserts prepared with allowed flours. Some commercial ice creams. Ask your dietitian about specific brands of dessert that are allowed.  Fats and Oils Butter, margarine, vegetable oil, sour cream not containing modified food starch, whipping cream, shortening, lard, cream, and some mayonnaise. Some commercial salad dressings. Peanut butter.  Other Homemade broth and  soups made with allowed ingredients; some canned or frozen soups. Any other combination or prepared foods that do not contain gluten. Monosodium glutamate (MSG). Cider, rice, and wine vinegar. Baking soda and baking powder. Certain soy sauces (Tamari). Ask your dietitian about specific brands that are allowed. Nuts, coconut, chocolate, and pure cocoa powder. Salt, pepper, herbs, spices, extracts, and food colorings. The items listed above may not be a complete list of allowed foods or beverages. Contact your dietitian for more options.  WHAT FOODS CAN I NOT EAT? Below is a list of common foods that are not allowed with a gluten-free diet.  Grains Barley, bran, bulgur, cracked wheat, graham, malt, matzo, wheat germ, and all wheat and rye cereals including spelt and kamut. Avoid cereals containing malt as a flavoring, such as rice cereal. Also avoid regular noodles, spaghetti, macaroni, and most packaged rice mixes, and all others containing wheat, rye, barley, or triticale.  Vegetables Most creamed vegetables, most vegetables canned in sauces, and any vegetables prepared with wheat, rye, barley, or triticale.  Fruits Thickened or prepared fruits and some pie fillings.  Meats and Other Protein Sources Any meat or meat alternative containing wheat, rye, barley, or gluten stabilizers (such as some hot dogs, salami, cold cuts, or sausage). Bread-containing products, such as Swiss steak, croquettes, and meatloaf. Most tuna canned in vegetable broth, [place] with hydrolyzed vegetable protein (HVP) injected as part of the basting, and any cheese product containing oat gum as an ingredient. [place]. Imitation fish. Dairy Commercial chocolate milk, which may have cereal added, and malted milk. Beverages Certain cereal beverages. Beer and  ciders (unless GF), ale, malted milk, and some root beers. Sweetsand Desserts Commercial candies containing wheat, rye, barley, or triticale. Certain toffees are dusted  with wheat flour. Chocolate-coated nuts, which are often rolled in flour. Cakes, cookies, doughnuts, and pastries that are prepared with wheat, barley, rye, or triticale flour. Some commercial ice creams, ice cream flavors which contain cookies, crumbs, or cheesecake. Ice cream cones. Commercially prepared mixes for cakes, cookies, and other desserts unless marked GF. Bread pudding and other puddings thickened with flour. Fats and Oils Some commercial salad dressings and sour cream containing modified food starch.  Condiments Some curry powder, some dry seasoning mixes, some gravy extracts, some meat sauces, some ketchup, some prepared mustard, horseradish. Other All soups containing wheat, rye, barley, or triticale flour. Bouillon and bouillon cubes that contain HVP. Combination or prepared foods that contain gluten. Some soy sauce, some chip dips, and some chewing gum. Yeast extract (contains barley). Caramel color (may contain malt). The items listed above may not be a complete list of foods and beverages to avoid. Contact your dietitian for more information. Document Released: 07/10/2005 Document Revised: 11/24/2013 Document Reviewed: 05/14/2013 [facility] Patient Information 2015 [place], [place]. This information is not intended to replace advice given to you by your health care provider. Make sure you discuss any questions you have with your health care provider.   Lactose-Free Diet Lactose is a carbohydrate that is found mainly in milk and milk products, as well as in foods with added milk or whey. Lactose must be digested by the enzyme lactase in order to be used by the body. Lactose intolerance occurs when there is a shortage of lactase. When your body is not able to digest lactose, you may feel sick to your stomach (nausea), bloated, and have cramps, gas, and diarrhea. TYPES OF LACTASE DEFICIENCY  Primary lactase deficiency. This is the most common type. It is characterized by a slow  decrease in lactase activity.  Secondary lactase deficiency. This occurs following injury to the small intestinal mucosa as a result of a disease or condition. It can also occur as a result of surgery or after treatment with antibiotic medicines or cancer drugs. Tolerance to lactose varies widely. Each person must determine how much milk can be consumed without developing symptoms. Drinking smaller portions of milk throughout the day may be helpful. Some studies suggest that slowing gastric emptying may help increase tolerance of milk products. This may be done by:  Consuming milk or milk products with a meal rather than alone.  Consuming milk with a higher fat content. There are many dairy products that may be tolerated better than milk by some people, including:  Cheese (especially aged cheese). The lactose content is much lower than in milk.  Cultured dairy products, such as yogurt, buttermilk, cottage cheese, and sweet acidophilus milk (kefir). These products are usually well tolerated by lactase-deficient people. This is because the healthy bacteria help digest lactose.  Lactose-hydrolyzed milk. This product contains 40% to 90% less lactose than milk and may also be well tolerated. ADEQUACY These diets may be deficient in calcium, riboflavin, and vitamin D, according to the Recommended Dietary Allowances of the [employer]. Depending on individual tolerances and the use of milk substitutes, milk, or other dairy products, you may be able to meet these recommendations. SPECIAL NOTES  Lactose is a carbohydrate. The main food source for lactose is dairy products. Reading food labels is important. Many products contain lactose even when they are not made from  milk. Look for the following words: whey, milk solids, dry milk solids, nonfat dry milk powder. Typical sources of lactose other than dairy products include breads, candies, cold cuts, prepared and processed foods, and  commercial sauces and gravies.  All foods must be prepared without milk, cream, or other dairy foods.  A vitamin or mineral supplement may be necessary. Consult your caregiver or Registered Dietitian.  Lactose is also found in many prescription and over-the-counter medicines.  Soy milk and lactose-free supplements may be used as an alternative to milk. CHOOSING FOODS Breads and Starches  Allowed: Breads and rolls made without milk. [place], [place], or [place] bread. Soda crackers, graham crackers. Any crackers prepared without lactose. Cooked or dry cereals prepared without lactose (read labels). Any potatoes, pasta, or rice prepared without milk or lactose. Popcorn.  Avoid: Breads and rolls that contain milk. Prepared mixes such as muffins, biscuits, waffles, pancakes. Sweet rolls, donuts, [place] toast (if made with milk or lactose). Zwieback crackers, corn curls, or any crackers that contain lactose. Cooked or dry cereals prepared with lactose (read labels). Instant potatoes, frozen [place] fries, scalloped or au gratin potatoes. Vegetables  Allowed: Fresh, frozen, and canned vegetables.  Avoid: Creamed or breaded vegetables. Vegetables in a cheese sauce or with lactose-containing margarines. Fruit  Allowed: All fresh, canned, or frozen fruits that are not processed with lactose.  Avoid: Any canned or frozen fruits processed with lactose. Meat and Meat Substitutes  Allowed: Plain beef, chicken, fish, [place], lamb, veal, pork, or ham. Kosher prepared meat products. Strained or junior meats that do not contain milk. Eggs, soy meat substitutes, nuts.  Avoid: Scrambled eggs, omelets, and souffles that contain milk. Creamed or breaded meat, fish, or fowl. Sausage products such as wieners, liver sausage, or cold cuts that contain milk solids. Cheese, cottage cheese, or cheese spreads. Milk  Allowed: None.  Avoid: Milk (whole, 2%, skim, or chocolate). Evaporated, powdered, or condensed  milk. Malted milk. Soups and Combination Foods  Allowed: Bouillon, broth, vegetable soups, clear soups, consomms. Homemade soups made with allowed ingredients. Combination or prepared foods that do not contain milk or milk products (read labels).  Avoid: Cream soups, chowders, commercially prepared soups containing lactose. Macaroni and cheese, pizza. Combination or prepared foods that contain milk or milk products. Desserts and Sweets  Allowed: Water and fruit ices, gelatin, angel food cake. Homemade cookies, pies, or cakes made from allowed ingredients. Pudding (if made with water or a milk substitute). Lactose-free tofu desserts. Sugar, honey, corn syrup, jam, jelly, marmalade, molasses (beet sugar). Pure sugar candy, marshmallows.  Avoid: Ice cream, ice milk, sherbet, custard, pudding, frozen yogurt. Commercial cake and cookie mixes. Desserts that contain chocolate. Pie crust made with milk-containing margarine. Reduced calorie desserts made with a sugar substitute that contains lactose. Toffee, peppermint, butterscotch, chocolate, caramels. Fats and Oils  Allowed: Butter (as tolerated, contains very small amounts of lactose). Margarines and dressings that do not contain milk. Vegetable oils, shortening, mayonnaise, nondairy cream and whipped toppings without lactose or milk solids added. Berniece Salines.  Avoid: Margarines and salad dressings containing milk. Cream, cream cheese, peanut butter with added milk solids, sour cream, chip dips made with sour cream. Beverages  Allowed: Carbonated drinks, tea, coffee and freeze-dried coffee, some instant coffees (check labels). Fruit drinks, fruit and vegetable juice, rice or soy milk.  Avoid: Hot chocolate. Some cocoas, some instant coffees, instant iced teas, powdered fruit drinks (read labels). Condiments  Allowed: Soy sauce, carob powder, olives, gravy made with water, baker's cocoa,  pickles, pure seasonings and spices, wine, pure monosodium  glutamate, catsup, mustard.  Avoid: Some chewing gums, chocolate, some cocoas. Certain antibiotics and vitamin or mineral preparations. Spice blends if they contain milk products. MSG extender. Artificial sweeteners that contain lactose. Some nondairy creamers (read labels). SAMPLE MENU Breakfast  Orange juice.  Banana.  Bran cereal.  Nondairy creamer.  [place] bread, toasted.  Butter or milk-free margarine.  Coffee or tea. Lunch  Chicken breast.  Rice.  Green beans.  Butter or milk-free margarine.  Fresh melon.  Coffee or tea. Dinner  [employer].  Baked potato.  Butter or milk-free margarine.  Broccoli.  Lettuce salad with vinegar and oil dressing.  [employer]  Coffee or tea. Document Released: 12/30/2001 Document Revised: 10/02/2011 Document Reviewed: 10/10/2013 [facility] Patient Information 2015 [address], [place]. This information is not intended to replace advice given to you by your health care provider. Make sure you discuss any questions you have with your health care provider.

## 2014-10-26 ENCOUNTER — Other Ambulatory Visit: Payer: Self-pay

## 2014-10-26 DIAGNOSIS — Z1231 Encounter for screening mammogram for malignant neoplasm of breast: Secondary | ICD-10-CM

## 2014-10-28 DIAGNOSIS — N761 Subacute and chronic vaginitis: Secondary | ICD-10-CM | POA: Diagnosis not present

## 2014-10-28 DIAGNOSIS — R102 Pelvic and perineal pain: Secondary | ICD-10-CM | POA: Diagnosis not present

## 2014-11-02 ENCOUNTER — Telehealth: Payer: Self-pay | Admitting: Gastroenterology

## 2014-11-02 NOTE — Telephone Encounter (Signed)
Spoke with patient's husband and he is interested in getting patient in to see Dr. Ardis Hughs earlier than her May OV. Told him that Dr. Ardis Hughs is booked out until June. Offered to ask for H. Pylori treatment to be ordered since patient is still having symptoms. He states she does not want to do this at this time. They will wait for OV.

## 2014-11-16 DIAGNOSIS — M5416 Radiculopathy, lumbar region: Secondary | ICD-10-CM | POA: Diagnosis not present

## 2014-11-16 DIAGNOSIS — M47816 Spondylosis without myelopathy or radiculopathy, lumbar region: Secondary | ICD-10-CM | POA: Diagnosis not present

## 2014-11-16 DIAGNOSIS — M797 Fibromyalgia: Secondary | ICD-10-CM | POA: Diagnosis not present

## 2014-11-16 DIAGNOSIS — M810 Age-related osteoporosis without current pathological fracture: Secondary | ICD-10-CM | POA: Diagnosis not present

## 2014-11-16 DIAGNOSIS — M329 Systemic lupus erythematosus, unspecified: Secondary | ICD-10-CM | POA: Diagnosis not present

## 2014-11-16 DIAGNOSIS — M545 Low back pain: Secondary | ICD-10-CM | POA: Diagnosis not present

## 2014-11-17 ENCOUNTER — Ambulatory Visit: Payer: Medicare Other | Attending: Obstetrics and Gynecology | Admitting: Physical Therapy

## 2014-11-17 ENCOUNTER — Encounter: Payer: Self-pay | Admitting: Physical Therapy

## 2014-11-17 DIAGNOSIS — K589 Irritable bowel syndrome without diarrhea: Secondary | ICD-10-CM | POA: Diagnosis not present

## 2014-11-17 DIAGNOSIS — K219 Gastro-esophageal reflux disease without esophagitis: Secondary | ICD-10-CM | POA: Insufficient documentation

## 2014-11-17 DIAGNOSIS — R102 Pelvic and perineal pain: Secondary | ICD-10-CM | POA: Insufficient documentation

## 2014-11-17 DIAGNOSIS — E039 Hypothyroidism, unspecified: Secondary | ICD-10-CM | POA: Diagnosis not present

## 2014-11-17 DIAGNOSIS — M797 Fibromyalgia: Secondary | ICD-10-CM | POA: Insufficient documentation

## 2014-11-17 DIAGNOSIS — E780 Pure hypercholesterolemia: Secondary | ICD-10-CM | POA: Diagnosis not present

## 2014-11-17 DIAGNOSIS — M810 Age-related osteoporosis without current pathological fracture: Secondary | ICD-10-CM | POA: Insufficient documentation

## 2014-11-17 DIAGNOSIS — M329 Systemic lupus erythematosus, unspecified: Secondary | ICD-10-CM | POA: Diagnosis not present

## 2014-11-17 DIAGNOSIS — M79605 Pain in left leg: Secondary | ICD-10-CM

## 2014-11-17 DIAGNOSIS — M5416 Radiculopathy, lumbar region: Secondary | ICD-10-CM | POA: Insufficient documentation

## 2014-11-17 DIAGNOSIS — M79604 Pain in right leg: Secondary | ICD-10-CM

## 2014-11-17 DIAGNOSIS — M6289 Other specified disorders of muscle: Secondary | ICD-10-CM

## 2014-11-17 NOTE — Therapy (Signed)
[facility] [address], [address] [place], [place], [address] Phone: [phone]   Fax:  [phone]  Physical Therapy Evaluation  Patient Details  Name: TAMME HOMEWOOD MRN: [id number] Date of Birth: 11-24-1992 Referring Provider:  Marcelle Overlie, MD  Encounter Date: 11/16/77      PT End of Session - 11/17/14 1231    Visit Number 1   Date for PT Re-Evaluation 01/12/15   PT Start Time 1145   PT Stop Time 1230   PT Time Calculation (min) 45 min   Activity Tolerance Patient tolerated treatment well   Behavior During Therapy [facility] for tasks assessed/performed      Past Medical History  Diagnosis Date  . Depression     treated in the past for years;stopped in 2010 for a years  . Panic attack   . Claustrophobia   . Fibromyalgia   . IBS (irritable bowel syndrome)     Dr. Elnoria Howard  . Hypercholesteremia     with high HDL  . Glaucoma, narrow-angle     s/p laser surgery  . Duodenal ulcer 1962    h/o  . Hyperthyroidism     h/o x 2 years  . Shingles 1999    h/o  . Hypothyroid 9/08  . GERD (gastroesophageal reflux disease)   . Trochanteric bursitis 12/2008    bilateral  . Superficial thrombophlebitis 03/2009    RLE  . Bell's palsy 1966  . Osteoporosis 10/11    Dr.Hawkes  . SLE (systemic lupus erythematosus)     with arthralgia/myalgia, positive ANA centromere pattern, positive DNA, positive RNP, indeterminant anti-cardiolipin antibody-Dr.Hawkes.  . Carotid artery disease 2010    on vascular screening;unchanged 2013.(could not tolerate simvastatin, no other statins tried)--<30% blockage bilat 07/2011  . Recurrent UTI     has cystocele-Dr.Grewal  . Chronic fatigue and malaise   . Chronic abdominal pain   . Frequent PVCs 07/2012    Seen by [place] [facility]: benign, asymptomatic, normal EF    Past Surgical History  Procedure Laterality Date  . Tonsillectomy  1946  . Vaginal hysterectomy  1971    and bladder repair.   Still has ovaries  . Cataract extraction, bilateral  1995, 1996  . Thyroidectomy, partial  09/2005    L nodule; Dr. Gerrit Friends  . Upper gi endoscopy  06/27/12  . Flexible sigmoidoscopy      There were no vitals filed for this visit.  Visit Diagnosis:  PFD (pelvic floor dysfunction) - Plan: PT plan of care cert/re-cert  Pain of left lower extremity - Plan: PT plan of care cert/re-cert  Pain of right lower extremity - Plan: PT plan of care cert/re-cert      Subjective Assessment - 11/17/14 1155    Subjective Patient reports pelvic pain started 3 months ago. Patient reports she felt she had a bladder infection. MD felt it was pain coming from the left area.  Patient reports she has difficulty with straightening her right leg due to siffness.  Patient reports pain is 10/10 when sleeping.  Patient had difficulty with constipation and took Miralax.    Pertinent History Osteoporosis; irritable bowel   Limitations Sitting   How long can you sit comfortably? 30 min. with pain going down both legs   How long can you walk comfortably? 40 min.    Diagnostic tests CAT scan that showed cyst on ovary   Patient Stated Goals reduce pain with activities   Currently in Pain? Yes   Pain  Score 5    Pain Location Pelvis  bil. legs   Pain Orientation Right;Left;Lower   Pain Descriptors / Indicators Constant;Tiring   Pain Onset More than a month ago   Pain Frequency Constant   Aggravating Factors  getting in and out of car, sleep at night   Pain Relieving Factors Motrin            [place] PT Assessment - 11/17/14 0001    Assessment   Medical Diagnosis Vaginitis, pelvic floor   Onset Date 08/24/14   Prior Therapy None   Precautions   Precautions --  No joint mobilization   Balance Screen   Has the patient fallen in the past 6 months No   Has the patient had a decrease in activity level because of a fear of falling?  No   Is the patient reluctant to leave their home because of a fear of falling?   No   Home Environment   Living Enviornment Private residence   Type of Home --  town house   Home Layout Two level  master on first level    Alternate Level Stairs-Rails Can reach both  has to hold the railing   Prior Function   Level of Independence Independent with basic ADLs   Vocation Retired   Observation/Other Assessments   Focus on Therapeutic Outcomes (FOTO)  29%  limitation   Posture/Postural Control   Posture/Postural Control Postural limitations   Postural Limitations Rounded Shoulders;Forward head;Decreased lumbar lordosis;Flexed trunk   Posture Comments stands with right knee flexed   AROM   Overall AROM Comments right knee extension -15 degrees   Lumbar Flexion decreased by 25%   Lumbar Extension decreased by 50%   Lumbar - Right Side Bend decreased by 50%   Lumbar - Left Side Bend decreased by 50%   PROM   Overall PROM  Deficits   Overall PROM Comments bil. hip rotation limited by 25%   Strength   Right/Left Hip --  bil. hip strength is 4/5.    Right/Left Knee --  bil. hamstring 4/5   Palpation   Palpation right ilium is anteriorly rotated, Palpable tenderness located in left psoas and lower quadrant, pinpoint tenderness located on bil. great trochanter, suprapubically   Bed Mobility   Bed Mobility --  rolls slowly with pain   Ambulation/Gait   Ambulation/Gait Yes   Ambulation/Gait Assistance 6: Modified independent (Device/Increase time)   Gait Pattern Decreased step length - right;Decreased step length - left;Decreased hip/knee flexion - right;Decreased dorsiflexion - right;Decreased dorsiflexion - left;Right flexed knee in stance;Shuffle;Poor foot clearance - left;Poor foot clearance - right   Stairs Yes   Stairs Assistance 6: Modified independent (Device/Increase time)   Stair Management Technique Two rails;Alternating pattern                 Pelvic Floor Special Questions - 11/17/14 0001    Currently Sexually Active No   Urinary Leakage No    Falling out feeling (prolapse) Yes   Prolapse Anterior Wall   Pelvic Floor Internal Exam Patient confirms identification and approved the physical therapist to assess muscle strength and integrity   Exam Type Vaginal   Palpation palpable tenderness with bil. obturator internist, levator ani   Strength weak squeeze, no lift   Strength # of reps 3   Strength # of seconds 3                  PT Education - 11/17/14 1231  Education provided No          PT Short Term Goals - Dec 17, 2014 1323    PT SHORT TERM GOAL #1   Title pain with daily activities decreased >/= 25%   Time 4   Period Weeks   Status New   PT SHORT TERM GOAL #2   Title pelvic floor strength >/= 3/5 holding for 8 seconds   Time 4   Period Weeks   Status New   PT SHORT TERM GOAL #3   Title be able to go out to entertain with >/ =25% greater ease due to decreased pain   Time 4   Period Weeks   Status New   PT SHORT TERM GOAL #4   Title ability to ambulate with heel toe gait due to increased right knee extension >/=-5 degrees   Time 4   Period Weeks   Status New   PT SHORT TERM GOAL #5   Title understand ways to toilet correctly to decreased feeling of constipation and strain on prolapse   Time 4   Period Weeks   Status New           PT Long Term Goals - Dec 17, 2014 1327    PT LONG TERM GOAL #1   Title pain with daily activities decreased >/= 50%    Time 8   Period Weeks   Status New   PT LONG TERM GOAL #2   Title pelvic floor strength >/= 4/5   Time 8   Period Weeks   Status New   PT LONG TERM GOAL #3   Title move in bed with pain decreased  >/= 50%   Time 8   Period Weeks   Status New   PT LONG TERM GOAL #4   Title ambulate with bigger step length due to increased strength and increased hip mobility   Time 8   Period Weeks   Status New               Plan - 12-17-14 1232    Clinical Impression Statement Patient is a 81 year old female with pelvic pain, bil. lower  extremity pain, and decreased mobility.  Patient reports pelvic pain began 3 months ago with sudden onset. Patient has FOTO score of 45% limitaiton due to pain and lack of mobility.  Patient ambulate with decreased step length bil. decreased right knee extension in stance phase, and decreased bil. heel strike. Patient has decreased strength and range of motion of bil .hips by 25%.  Patient has decreased lumbar range of motion.    Pt will benefit from skilled therapeutic intervention in order to improve on the following deficits Abnormal gait;Decreased range of motion;Difficulty walking;Impaired flexibility;Postural dysfunction;Decreased endurance;Decreased activity tolerance;Increased fascial restricitons;Pain;Increased muscle spasms;Decreased mobility;Decreased strength   Rehab Potential Good   PT Frequency 2x / week   PT Duration 8 weeks   PT Treatment/Interventions Moist Heat;Therapeutic activities;Patient/family education;Passive range of motion;Therapeutic exercise;Biofeedback;Ultrasound;Gait training;Manual techniques;Neuromuscular re-education;Cryotherapy;Electrical Stimulation;Functional mobility training   PT Next Visit Plan modalities for pain relief, education on tips to avoid falls, stretches, soft tissue work, flexibility exercises   PT Home Exercise Plan Posture and flexibility exercises   Recommended Other Services None   Consulted and Agree with Plan of Care Patient          G-Codes - Dec 17, 2014 1328    Functional Assessment Tool Used FOTO 58% score is limitation due to pain and reduced mobility   Functional Limitation Other PT subsequent  Other PT Secondary Current Status [phone]) At least 40 percent but less than 60 percent impaired, limited or restricted   Other PT Secondary Goal Status ([id number]) At least 40 percent but less than 60 percent impaired, limited or restricted       Problem List Patient Active Problem List   Diagnosis Date Noted  . Epigastric fullness 10/12/2014   . Early satiety 10/12/2014  . Cystocele 12/30/2013  . IBS (irritable bowel syndrome) 09/30/2013  . GERD (gastroesophageal reflux disease) 06/27/2013  . PVC's (premature ventricular contractions) 10/02/2012  . Depressive disorder, not elsewhere classified 10/02/2012  . H. pylori infection 08/01/2012  . Bradycardia 08/01/2012  . Fatigue 08/01/2012  . Anxiety state, unspecified 05/13/2012  . Osteopenia 05/13/2012  . Unspecified hypothyroidism 05/13/2012    Roshaun Pound,PT 11/17/2014, 1:35 PM  [facility] [address], [address] [place], [place], [address] Phone: [phone]   Fax:  [phone]

## 2014-11-23 ENCOUNTER — Ambulatory Visit (INDEPENDENT_AMBULATORY_CARE_PROVIDER_SITE_OTHER): Payer: Medicare Other | Admitting: Licensed Clinical Social Worker

## 2014-11-23 DIAGNOSIS — F419 Anxiety disorder, unspecified: Secondary | ICD-10-CM | POA: Diagnosis not present

## 2014-11-26 ENCOUNTER — Ambulatory Visit: Payer: Medicare Other | Attending: Obstetrics and Gynecology | Admitting: Physical Therapy

## 2014-11-26 ENCOUNTER — Encounter: Payer: Self-pay | Admitting: Physical Therapy

## 2014-11-26 DIAGNOSIS — M79605 Pain in left leg: Secondary | ICD-10-CM

## 2014-11-26 DIAGNOSIS — M329 Systemic lupus erythematosus, unspecified: Secondary | ICD-10-CM | POA: Diagnosis not present

## 2014-11-26 DIAGNOSIS — M79604 Pain in right leg: Secondary | ICD-10-CM

## 2014-11-26 DIAGNOSIS — E780 Pure hypercholesterolemia: Secondary | ICD-10-CM | POA: Diagnosis not present

## 2014-11-26 DIAGNOSIS — M810 Age-related osteoporosis without current pathological fracture: Secondary | ICD-10-CM | POA: Insufficient documentation

## 2014-11-26 DIAGNOSIS — M5416 Radiculopathy, lumbar region: Secondary | ICD-10-CM | POA: Diagnosis not present

## 2014-11-26 DIAGNOSIS — E039 Hypothyroidism, unspecified: Secondary | ICD-10-CM | POA: Diagnosis not present

## 2014-11-26 DIAGNOSIS — K219 Gastro-esophageal reflux disease without esophagitis: Secondary | ICD-10-CM | POA: Insufficient documentation

## 2014-11-26 DIAGNOSIS — M797 Fibromyalgia: Secondary | ICD-10-CM | POA: Diagnosis not present

## 2014-11-26 DIAGNOSIS — R102 Pelvic and perineal pain: Secondary | ICD-10-CM | POA: Diagnosis not present

## 2014-11-26 DIAGNOSIS — K589 Irritable bowel syndrome without diarrhea: Secondary | ICD-10-CM | POA: Diagnosis not present

## 2014-11-26 DIAGNOSIS — M6289 Other specified disorders of muscle: Secondary | ICD-10-CM

## 2014-11-26 NOTE — Patient Instructions (Addendum)
About Abdominal Massage  Abdominal massage, also called external colon massage, is a self-treatment circular massage technique that can reduce and eliminate gas and ease constipation. The colon naturally contracts in waves in a clockwise direction starting from inside the right hip, moving up toward the ribs, across the belly, and down inside the left hip.  When you perform circular abdominal massage, you help stimulate your colon's normal wave pattern of movement called peristalsis.  It is most beneficial when done after eating.  Positioning You can practice abdominal massage with oil while lying down, or in the shower with soap.  Some people find that it is just as effective to do the massage through clothing while sitting or standing.  How to Massage Start by placing your finger tips or knuckles on your right side, just inside your hip bone.  . Make small circular movements while you move upward toward your rib cage.   . Once you reach the bottom right side of your rib cage, take your circular movements across to the left side of the bottom of your rib cage.  . Next, move downward until you reach the inside of your left hip bone.  This is the path your feces travel in your colon. . Continue to perform your abdominal massage in this pattern for 10 minutes each day.     You can apply as much pressure as is comfortable in your massage.  Start gently and build pressure as you continue to practice.  Notice any areas of pain as you massage; areas of slight pain may be relieved as you massage, but if you have areas of significant or intense pain, consult with your healthcare provider.  Other Considerations . General physical activity including bending and stretching can have a beneficial massage-like effect on the colon.  Deep breathing can also stimulate the colon because breathing deeply activates the same nervous system that supplies the colon.   . Abdominal massage should always be used in  combination with a bowel-conscious diet that is high in the proper type of fiber for you, fluids (primarily water), and a regular exercise program.  Adductor Stretch - Supine   Lie on floor, knees bent, feet flat. Keeping feet together, lower knees toward floor until stretch felt at inner thighs. Hold 30 seconds.  Repeat _3__ times. Do __1_ times per day.  Copyright  VHI. All rights reserved.  Piriformis Stretch, Sitting   Do while laying on back or Sit, one ankle on opposite knee, same-side hand on crossed knee. Push down on knee, keeping spine straight. Lean torso forward, with flat back, until tension is felt in hamstrings and gluteals of crossed-leg side. Hold _30__ seconds.  Repeat _2__ times per session. Do _1__ sessions per day.  Copyright  VHI. All rights reserved.   Preventing Falls and Fractures  Falls can be very serious, especially for older adults or people with osteoporosis  Falls can be caused by:  Tripping or slipping  Slow reflexes  Balance problems  Reduced muscle strength  Poor vision or a recent change in prescription  Illness and some medications (especially blood pressure pills, diuretics, heart medicines, muscle relaxants and sleep medications)  Drinking alcohol  To prevent falls outdoors:  Use a can or walker if needed  Wear rubber-soled shoes so you don't slip  DO NOT buy "shape up" shoes with rocker bottom soles if you have balance problems.  The thick soles and shape make it more difficult to keep your balance.  Put  kitty litter or salt on icy sidewalks  Walk on the grass if the sidewalks are slick  Avoid walking on uneven ground whenever possible  T prevent falls indoors:  Keep rooms clutter-free, especially hallways, stairs and paths to light switches  Remove throw rugs  Install night lights, especially to and in the bathroom  Turn on lights before going downstairs  Keep a flashlight next to your bed  Buy a cordless phone  to keep with you instead of jumping up to answer the phone  Install grab bars in the bathroom near the shower and toilet  Install rails on both sides of the stairs.  Make sure the stairs are well lit  Wear slippers with non-skid soles.  Do not walk around in stockings or socks  Balance problems and dizziness are not a normal part of growing older.  If you begin having balance problems or dizziness see your doctor.  Physical Therapy can help you with many balance problems, strengthening hip and leg muscles and with gait training.  To keep your bones healthy make sure you are getting enough calcium and Vitamin D each day.  Ask your doctor or pharmacist about supplements.  Regular weight-bearing exercise like walking, lifting weights or dancing can help strengthen bones and prevent osteoporosis.  Bridging   Lie on back with feet shoulder width apart. Lift hips toward the ceiling. Hold 5____ seconds. Repeat _10___ times. Do _1___ sessions per day.  http://gt2.exer.us/356   Copyright  VHI. All rights reserved.   Hip Flexor Stretch   Lying on back near edge of bed, bend one leg, foot flat. Hang other leg over edge, relaxed, thigh resting entirely on bed for _30 seconds. Repeat __2__ times. Do ___1_ sessions per day. Then do on other leg.    http://gt2.exer.us/346   Copyright  VHI. All rights reserved.  Best Stretch   Using a chair for balance if necessary, place one leg back, foot flat on floor, forward leg bent. Slowly shift weight to forward leg until a stretch is felt in back leg. Be sure front knee DOES NOT extend past toes. Hold __30__ seconds. Change legs and repeat. Repeat _2___ times. Do _1___ sessions per day.  http://gt2.exer.us/348   Copyright  VHI. All rights reserved.  Patient able to return demonstration correctly with above exercises.

## 2014-11-26 NOTE — Therapy (Signed)
[facility] [address] [place], [place], [address] Phone: [phone]   Fax:  [phone]  Physical Therapy Treatment  Patient Details  Name: Tracy Malone MRN: [id number] Date of Birth: [birth year] Referring Provider:  Marcelle Overlie, MD  Encounter Date: 11/26/2014      PT End of Session - 11/26/14 1611    Visit Number 2   Number of Visits 10  Medicare   Date for PT Re-Evaluation 01/12/15   PT Start Time 1530   PT Stop Time 1615   PT Time Calculation (min) 45 min   Activity Tolerance Patient tolerated treatment well   Behavior During Therapy [facility] for tasks assessed/performed      Past Medical History  Diagnosis Date  . Depression     treated in the past for years;stopped in 2010 for a years  . Panic attack   . Claustrophobia   . Fibromyalgia   . IBS (irritable bowel syndrome)     Dr. Elnoria Howard  . Hypercholesteremia     with high HDL  . Glaucoma, narrow-angle     s/p laser surgery  . Duodenal ulcer 1962    h/o  . Hyperthyroidism     h/o x 2 years  . Shingles 1999    h/o  . Hypothyroid 9/08  . GERD (gastroesophageal reflux disease)   . Trochanteric bursitis 12/2008    bilateral  . Superficial thrombophlebitis 03/2009    RLE  . Bell's palsy 1966  . Osteoporosis 10/11    Dr.Hawkes  . SLE (systemic lupus erythematosus)     with arthralgia/myalgia, positive ANA centromere pattern, positive DNA, positive RNP, indeterminant anti-cardiolipin antibody-Dr.Hawkes.  . Carotid artery disease 2010    on vascular screening;unchanged 2013.(could not tolerate simvastatin, no other statins tried)--<30% blockage bilat 07/2011  . Recurrent UTI     has cystocele-Dr.Grewal  . Chronic fatigue and malaise   . Chronic abdominal pain   . 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 Tonsillectomy  1946  . Vaginal hysterectomy   1971    and bladder repair.  Still has ovaries  . Cataract extraction, bilateral  1995, 1996  . Thyroidectomy, partial  09/2005    L nodule; Dr. Gerrit Friends  . Upper gi endoscopy  06/27/12  . Flexible sigmoidoscopy      There were no vitals filed for this visit.  Visit Diagnosis:  PFD (pelvic floor dysfunction)  Pain of left lower extremity  Pain of right lower extremity      Subjective Assessment - 11/26/14 1534    Subjective Patient reports she had pain in upper abdomen.    Pertinent History Osteoporosis; irritable bowel   Limitations Sitting   How long can you sit comfortably? 30 min. with pain going down both legs   How long can you walk comfortably? 40 min.    Diagnostic tests CAT scan that showed cyst on ovary   Patient Stated Goals reduce pain with activities   Currently in Pain? Yes   Pain Score 5    Pain Location Pelvis  bil. outter leg   Pain Orientation Right;Left;Lower   Pain Descriptors / Indicators Constant;Tiring   Pain Type Chronic pain   Pain Onset More than a month ago   Pain Frequency Constant   Aggravating Factors  when she eats, getting in and out of car   Pain Relieving Factors  Motrin   Multiple Pain Sites No                         [facility] Adult PT Treatment/Exercise - 11/26/14 0001    Manual Therapy   Manual Therapy Massage;Myofascial release   Massage circular massage around the abdomen to facilitated movement of gas in large intestines.   Myofascial Release to diaphragm, lower abdominal                PT Education - 11/26/14 1610    Education provided Yes   Education Details abdominal massage, gastroc stretch, butterfly stretch, piriformis stretch, hip flexor stretch, bridge, Tips to avoid falls   Person(s) Educated Patient   Methods Explanation;Demonstration;Tactile cues;Verbal cues;Handout   Comprehension Verbal cues required;Returned demonstration;Verbalized understanding          PT Short Term Goals - 11/26/14  1612    PT SHORT TERM GOAL #1   Title pain with daily activities decreased >/= 25%   Time 4   Period Weeks   Status New   PT SHORT TERM GOAL #2   Title pelvic floor strength >/= 3/5 holding for 8 seconds   Time 4   Period Weeks   Status New   PT SHORT TERM GOAL #3   Title be able to go out to entertain with >/ =25% greater ease due to decreased pain   Time 4   Period Weeks   Status New   PT SHORT TERM GOAL #4   Title ability to ambulate with heel toe gait due to increased right knee extension >/=-5 degrees   Time 4   Period Weeks   Status New   PT SHORT TERM GOAL #5   Title understand ways to toilet correctly to decreased feeling of constipation and strain on prolapse   Time 4   Period Weeks   Status New           PT Long Term Goals - 11/17/14 1327    PT LONG TERM GOAL #1   Title pain with daily activities decreased >/= 50%    Time 8   Period Weeks   Status New   PT LONG TERM GOAL #2   Title pelvic floor strength >/= 4/5   Time 8   Period Weeks   Status New   PT LONG TERM GOAL #3   Title move in bed with pain decreased  >/= 50%   Time 8   Period Weeks   Status New   PT LONG TERM GOAL #4   Title ambulate with bigger step length due to increased strength and increased hip mobility   Time 8   Period Weeks   Status New               Plan - 11/26/14 1612    Clinical Impression Statement Patient is a 81 year old female with pelvic pain, abdominal and bil. hip pain. Today patient had difficulty with constipation and tenderness in abdomen and diaphragm.  Patient has learned flexibilty exercises to improve her mobility and decrease her pain.  Patient has not met goals due to her  just starting physical therapy.  Patient has learned tips to avoid falls and verbally understands them.    Pt will benefit from skilled therapeutic intervention in order to improve on the following deficits Abnormal gait;Decreased range of motion;Difficulty walking;Impaired  flexibility;Postural dysfunction;Decreased endurance;Decreased activity tolerance;Increased fascial restricitons;Pain;Increased muscle spasms;Decreased mobility;Decreased strength   Rehab Potential Good  Clinical Impairments Affecting Rehab Potential None   PT Frequency 2x / week   PT Duration 8 weeks   PT Treatment/Interventions Moist Heat;Therapeutic activities;Patient/family education;Passive range of motion;Therapeutic exercise;Biofeedback;Ultrasound;Gait training;Manual techniques;Neuromuscular re-education;Cryotherapy;Electrical Stimulation;Functional mobility training   PT Next Visit Plan modalities for pain relief, toileting, posture, soft tissue work   PT Home Exercise Plan Posture, toileting technique   Consulted and Agree with Plan of Care Patient        Problem List Patient Active Problem List   Diagnosis Date Noted  . Epigastric fullness 10/12/2014  . Early satiety 10/12/2014  . Cystocele 12/30/2013  . IBS (irritable bowel syndrome) 09/30/2013  . GERD (gastroesophageal reflux disease) 06/27/2013  . PVC's (premature ventricular contractions) 10/02/2012  . Depressive disorder, not elsewhere classified 10/02/2012  . H. pylori infection 08/01/2012  . Bradycardia 08/01/2012  . Fatigue 08/01/2012  . Anxiety state, unspecified 05/13/2012  . Osteopenia 05/13/2012  . Unspecified hypothyroidism 05/13/2012    Mala Gibbard,PT 11/26/2014, 4:17 PM  [facility] [address] [place], [place], [address] Phone: [phone]   Fax:  [phone]

## 2014-12-01 ENCOUNTER — Encounter: Payer: Self-pay | Admitting: Physical Therapy

## 2014-12-01 ENCOUNTER — Ambulatory Visit: Payer: Medicare Other | Admitting: Physical Therapy

## 2014-12-01 DIAGNOSIS — M79604 Pain in right leg: Secondary | ICD-10-CM

## 2014-12-01 DIAGNOSIS — M797 Fibromyalgia: Secondary | ICD-10-CM | POA: Diagnosis not present

## 2014-12-01 DIAGNOSIS — M810 Age-related osteoporosis without current pathological fracture: Secondary | ICD-10-CM | POA: Diagnosis not present

## 2014-12-01 DIAGNOSIS — R102 Pelvic and perineal pain: Secondary | ICD-10-CM | POA: Diagnosis not present

## 2014-12-01 DIAGNOSIS — K589 Irritable bowel syndrome without diarrhea: Secondary | ICD-10-CM | POA: Diagnosis not present

## 2014-12-01 DIAGNOSIS — M79605 Pain in left leg: Secondary | ICD-10-CM

## 2014-12-01 DIAGNOSIS — M5416 Radiculopathy, lumbar region: Secondary | ICD-10-CM | POA: Diagnosis not present

## 2014-12-01 DIAGNOSIS — M329 Systemic lupus erythematosus, unspecified: Secondary | ICD-10-CM | POA: Diagnosis not present

## 2014-12-01 DIAGNOSIS — M6289 Other specified disorders of muscle: Secondary | ICD-10-CM

## 2014-12-01 NOTE — Therapy (Signed)
[facility] [address], [place], [place], [address] Phone: [phone]   Fax:  [phone]  Physical Therapy Treatment  Patient Details  Name: Tracy Malone MRN: [id number] Date of Birth: 1931-11-09 Referring Provider:  Dian Queen, MD  Encounter Date: 12/01/2014      PT End of Session - 12/01/14 0940    Visit Number 3   Number of Visits 10  Medicare   Date for PT Re-Evaluation 01/12/15   PT Start Time 0930   PT Stop Time [id number]   PT Time Calculation (min) 45 min   Activity Tolerance Patient tolerated treatment well   Behavior During Therapy [facility] for tasks assessed/performed      Past Medical History  Diagnosis Date  . Depression     treated in the past for years;stopped in 2010 for a years  . Panic attack   . Claustrophobia   . Fibromyalgia   . IBS (irritable bowel syndrome)     Dr. Benson Norway  . Hypercholesteremia     with high HDL  . Glaucoma, narrow-angle     s/p laser surgery  . Duodenal ulcer 1962    h/o  . Hyperthyroidism     h/o x 2 years  . Shingles 1999    h/o  . Hypothyroid 9/08  . GERD (gastroesophageal reflux disease)   . Trochanteric bursitis 12/2008    bilateral  . Superficial thrombophlebitis 03/2009    RLE  . Bell's palsy 1966  . Osteoporosis 10/11    Dr.Hawkes  . SLE (systemic lupus erythematosus)     with arthralgia/myalgia, positive ANA centromere pattern, positive DNA, positive RNP, indeterminant anti-cardiolipin antibody-Dr.Hawkes.  . Carotid artery disease 2010    on vascular screening;unchanged 2013.(could not tolerate simvastatin, no other statins tried)--<30% blockage bilat 07/2011  . Recurrent UTI     has cystocele-Dr.Grewal  . Chronic fatigue and malaise   . Chronic abdominal pain   . Frequent PVCs 07/2012    Seen by [place] [facility]: benign, asymptomatic, normal EF    Past Surgical History  Procedure Laterality Date  . Tonsillectomy  1946  . Vaginal hysterectomy   1971    and bladder repair.  Still has ovaries  . Cataract extraction, bilateral  1995, 1996  . Thyroidectomy, partial  09/2005    L nodule; Dr. Harlow Asa  . Upper gi endoscopy  06/27/12  . Flexible sigmoidoscopy      There were no vitals filed for this visit.  Visit Diagnosis:  PFD (pelvic floor dysfunction)  Pain of left lower extremity  Pain of right lower extremity      Subjective Assessment - 12/01/14 0932    Subjective I did not feel bad after last visit.  I have done the exercises.  The exercise with lifting my lifting my low back has been hurting. After physical therapy patient felt good for 3 days. Massaging the abdomen has helped.    Pertinent History Osteoporosis; irritable bowel   Limitations Sitting   How long can you sit comfortably? 15-20 min   How long can you stand comfortably? No difficulty   How long can you walk comfortably? no difficulty   Diagnostic tests CAT scan that showed cyst on ovary   Patient Stated Goals reduce pain with activities   Currently in Pain? Yes   Pain Score 7    Pain Location Hip  back   Pain Orientation Right;Left;Lower   Pain Descriptors / Indicators Constant;Jabbing   Pain Type Chronic  [facility] [address], [place], [place], [address] Phone: [phone]   Fax:  [phone]  Physical Therapy Treatment  Patient Details  Name: Tracy Malone MRN: [id number] Date of Birth: 1932-01-24 Referring Provider:  Dian Queen, MD  Encounter Date: 12/01/2014      PT End of Session - 12/01/14 0940    Visit Number 3   Number of Visits 10  Medicare   Date for PT Re-Evaluation 01/12/15   PT Start Time 0930   PT Stop Time 9678   PT Time Calculation (min) 45 min   Activity Tolerance Patient tolerated treatment well   Behavior During Therapy [facility] for tasks assessed/performed      Past Medical History  Diagnosis Date  . Depression     treated in the past for years;stopped in 2010 for a years  . Panic attack   . Claustrophobia   . Fibromyalgia   . IBS (irritable bowel syndrome)     Dr. Benson Norway  . Hypercholesteremia     with high HDL  . Glaucoma, narrow-angle     s/p laser surgery  . Duodenal ulcer 1962    h/o  . Hyperthyroidism     h/o x 2 years  . Shingles 1999    h/o  . Hypothyroid 9/08  . GERD (gastroesophageal reflux disease)   . Trochanteric bursitis 12/2008    bilateral  . Superficial thrombophlebitis 03/2009    RLE  . Bell's palsy 1966  . Osteoporosis 10/11    Dr.Hawkes  . SLE (systemic lupus erythematosus)     with arthralgia/myalgia, positive ANA centromere pattern, positive DNA, positive RNP, indeterminant anti-cardiolipin antibody-Dr.Hawkes.  . Carotid artery disease 2010    on vascular screening;unchanged 2013.(could not tolerate simvastatin, no other statins tried)--<30% blockage bilat 07/2011  . Recurrent UTI     has cystocele-Dr.Grewal  . Chronic fatigue and malaise   . Chronic abdominal pain   . 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 Tonsillectomy  1946  . Vaginal hysterectomy   1971    and bladder repair.  Still has ovaries  . Cataract extraction, bilateral  1995, 1996  . Thyroidectomy, partial  09/2005    L nodule; Dr. Harlow Asa  . Upper gi endoscopy  06/27/12  . Flexible sigmoidoscopy      There were no vitals filed for this visit.  Visit Diagnosis:  PFD (pelvic floor dysfunction)  Pain of left lower extremity  Pain of right lower extremity      Subjective Assessment - 12/01/14 0932    Subjective I did not feel bad after last visit.  I have done the exercises.  The exercise with lifting my lifting my low back has been hurting. After physical therapy patient felt good for 3 days. Massaging the abdomen has helped.    Pertinent History Osteoporosis; irritable bowel   Limitations Sitting   How long can you sit comfortably? 15-20 min   How long can you stand comfortably? No difficulty   How long can you walk comfortably? no difficulty   Diagnostic tests CAT scan that showed cyst on ovary   Patient Stated Goals reduce pain with activities   Currently in Pain? Yes   Pain Score 7    Pain Location Hip  back   Pain Orientation Right;Left;Lower   Pain Descriptors / Indicators Constant;Jabbing   Pain Type Chronic  [facility] [address], [place], [place], [address] Phone: [phone]   Fax:  [phone]  Physical Therapy Treatment  Patient Details  Name: Tracy Malone MRN: [id number] Date of Birth: 1932-01-24 Referring Provider:  Dian Queen, MD  Encounter Date: 12/01/2014      PT End of Session - 12/01/14 0940    Visit Number 3   Number of Visits 10  Medicare   Date for PT Re-Evaluation 01/12/15   PT Start Time 0930   PT Stop Time 9678   PT Time Calculation (min) 45 min   Activity Tolerance Patient tolerated treatment well   Behavior During Therapy [facility] for tasks assessed/performed      Past Medical History  Diagnosis Date  . Depression     treated in the past for years;stopped in 2010 for a years  . Panic attack   . Claustrophobia   . Fibromyalgia   . IBS (irritable bowel syndrome)     Dr. Benson Norway  . Hypercholesteremia     with high HDL  . Glaucoma, narrow-angle     s/p laser surgery  . Duodenal ulcer 1962    h/o  . Hyperthyroidism     h/o x 2 years  . Shingles 1999    h/o  . Hypothyroid 9/08  . GERD (gastroesophageal reflux disease)   . Trochanteric bursitis 12/2008    bilateral  . Superficial thrombophlebitis 03/2009    RLE  . Bell's palsy 1966  . Osteoporosis 10/11    Dr.Hawkes  . SLE (systemic lupus erythematosus)     with arthralgia/myalgia, positive ANA centromere pattern, positive DNA, positive RNP, indeterminant anti-cardiolipin antibody-Dr.Hawkes.  . Carotid artery disease 2010    on vascular screening;unchanged 2013.(could not tolerate simvastatin, no other statins tried)--<30% blockage bilat 07/2011  . Recurrent UTI     has cystocele-Dr.Grewal  . Chronic fatigue and malaise   . Chronic abdominal pain   . 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 Tonsillectomy  1946  . Vaginal hysterectomy   1971    and bladder repair.  Still has ovaries  . Cataract extraction, bilateral  1995, 1996  . Thyroidectomy, partial  09/2005    L nodule; Dr. Harlow Asa  . Upper gi endoscopy  06/27/12  . Flexible sigmoidoscopy      There were no vitals filed for this visit.  Visit Diagnosis:  PFD (pelvic floor dysfunction)  Pain of left lower extremity  Pain of right lower extremity      Subjective Assessment - 12/01/14 0932    Subjective I did not feel bad after last visit.  I have done the exercises.  The exercise with lifting my lifting my low back has been hurting. After physical therapy patient felt good for 3 days. Massaging the abdomen has helped.    Pertinent History Osteoporosis; irritable bowel   Limitations Sitting   How long can you sit comfortably? 15-20 min   How long can you stand comfortably? No difficulty   How long can you walk comfortably? no difficulty   Diagnostic tests CAT scan that showed cyst on ovary   Patient Stated Goals reduce pain with activities   Currently in Pain? Yes   Pain Score 7    Pain Location Hip  back   Pain Orientation Right;Left;Lower   Pain Descriptors / Indicators Constant;Jabbing   Pain Type Chronic

## 2014-12-01 NOTE — Patient Instructions (Addendum)
Toileting Techniques for Bowel Movements (Defecation) Using your belly (abdomen) and pelvic floor muscles to have a bowel movement is usually instinctive.  Sometimes people can have problems with these muscles and have to relearn proper defecation (emptying) techniques.  If you have weakness in your muscles, organs that are falling out, decreased sensation in your pelvis, or ignore your urge to go, you may find yourself straining to have a bowel movement.  You are straining if you are: . holding your breath or taking in a huge gulp of air and holding it  . keeping your lips and jaw tensed and closed tightly . turning red in the face because of excessive pushing or forcing . developing or worsening your  hemorrhoids . getting faint while pushing . not emptying completely and have to defecate many times a day  If you are straining, you are actually making it harder for yourself to have a bowel movement.  Many people find they are pulling up with the pelvic floor muscles and closing off instead of opening the anus. Due to lack pelvic floor relaxation and coordination the abdominal muscles, one has to work harder to push the feces out.  Many people have never been taught how to defecate efficiently and effectively.  Notice what happens to your body when you are having a bowel movement.  While you are sitting on the toilet pay attention to the following areas: . Jaw and mouth position . Angle of your hips   . Whether your feet touch the ground or not . Arm placement  . Spine position . Waist . Belly tension . Anus (opening of the anal canal)  An Evacuation/Defecation Plan   Here are the 4 basic points:  1. Lean forward enough for your elbows to rest on your knees 2. Support your feet on the floor or use a low stool if your feet don't touch the floor  3. Push out your belly as if you have swallowed a beach ball-you should feel a widening of your waist 4. Open and relax your pelvic floor muscles,  rather than tightening around the anus      The following conditions my require modifications to your toileting posture:  . If you have had surgery in the past that limits your back, hip, pelvic, knee or ankle flexibility . Constipation   Your healthcare practitioner may make the following additional suggestions and adjustments:  1) Sit on the toilet  a) Make sure your feet are supported. b) Notice your hip angle and spine position-most people find it effective to lean forward or raise their knees, which can help the muscles around the anus to relax  c) When you lean forward, place your forearms on your thighs for support  2) Relax suggestions a) Breath deeply in through your nose and out slowly through your mouth as if you are smelling the flowers and blowing out the candles. b) To become aware of how to relax your muscles, contracting and releasing muscles can be helpful.  Pull your pelvic floor muscles in tightly by using the image of holding back gas, or closing around the anus (visualize making a circle smaller) and lifting the anus up and in.  Then release the muscles and your anus should drop down and feel open. Repeat 5 times ending with the feeling of relaxation. c) Keep your pelvic floor muscles relaxed; let your belly bulge out. d) The digestive tract starts at the mouth and ends at the anal opening, so be   sure to relax both ends of the tube.  Place your tongue on the roof of your mouth with your teeth separated.  This helps relax your mouth and will help to relax the anus at the same time.  3) Empty (defecation) a) Keep your pelvic floor and sphincter relaxed, then bulge your anal muscles.  Make the anal opening wide.  b) Stick your belly out as if you have swallowed a beach ball. c) Make your belly wall hard using your belly muscles while continuing to breathe. Doing this makes it easier to open your anus. d) Breath out and give a grunt (or try using other sounds such as  ahhhh, shhhhh, ohhhh or grrrrrrr).  4) Finish a) As you finish your bowel movement, pull the pelvic floor muscles up and in.  This will leave your anus in the proper place rather than remaining pushed out and down. If you leave your anus pushed out and down, it will start to feel as though that is normal and give you incorrect signals about needing to have a bowel movement.    PT instructed patient on ambulating with big steps, heel toe and arm swings.  Patient requires verbal cues to perform correctly.

## 2014-12-03 ENCOUNTER — Encounter: Payer: Self-pay | Admitting: Physical Therapy

## 2014-12-03 ENCOUNTER — Ambulatory Visit: Payer: Medicare Other | Admitting: Physical Therapy

## 2014-12-03 DIAGNOSIS — M79604 Pain in right leg: Secondary | ICD-10-CM

## 2014-12-03 DIAGNOSIS — M797 Fibromyalgia: Secondary | ICD-10-CM | POA: Diagnosis not present

## 2014-12-03 DIAGNOSIS — M329 Systemic lupus erythematosus, unspecified: Secondary | ICD-10-CM | POA: Diagnosis not present

## 2014-12-03 DIAGNOSIS — M5416 Radiculopathy, lumbar region: Secondary | ICD-10-CM | POA: Diagnosis not present

## 2014-12-03 DIAGNOSIS — R102 Pelvic and perineal pain: Secondary | ICD-10-CM | POA: Diagnosis not present

## 2014-12-03 DIAGNOSIS — K589 Irritable bowel syndrome without diarrhea: Secondary | ICD-10-CM | POA: Diagnosis not present

## 2014-12-03 DIAGNOSIS — M810 Age-related osteoporosis without current pathological fracture: Secondary | ICD-10-CM | POA: Diagnosis not present

## 2014-12-03 DIAGNOSIS — M79605 Pain in left leg: Secondary | ICD-10-CM

## 2014-12-03 NOTE — Therapy (Signed)
[facility] [address] [place], [place], [address] Phone: [phone]   Fax:  [phone]  Physical Therapy Treatment  Patient Details  Name: RUNA PEDRAZA MRN: [id number] Date of Birth: 04-29-1932 Referring Provider:  Marcelle Overlie, MD  Encounter Date: 12/03/2014      PT End of Session - 12/03/14 1224    Visit Number 4   Number of Visits 10  Medicare   Date for PT Re-Evaluation 01/12/15   PT Start Time 1145   PT Stop Time 1225   PT Time Calculation (min) 40 min   Activity Tolerance Patient tolerated treatment well   Behavior During Therapy [facility] for tasks assessed/performed      Past Medical History  Diagnosis Date  . Depression     treated in the past for years;stopped in 2010 for a years  . Panic attack   . Claustrophobia   . Fibromyalgia   . IBS (irritable bowel syndrome)     Dr. Elnoria Howard  . Hypercholesteremia     with high HDL  . Glaucoma, narrow-angle     s/p laser surgery  . Duodenal ulcer 1962    h/o  . Hyperthyroidism     h/o x 2 years  . Shingles 1999    h/o  . Hypothyroid 9/08  . GERD (gastroesophageal reflux disease)   . Trochanteric bursitis 12/2008    bilateral  . Superficial thrombophlebitis 03/2009    RLE  . Bell's palsy 1966  . Osteoporosis 10/11    Dr.Hawkes  . SLE (systemic lupus erythematosus)     with arthralgia/myalgia, positive ANA centromere pattern, positive DNA, positive RNP, indeterminant anti-cardiolipin antibody-Dr.Hawkes.  . Carotid artery disease 2010    on vascular screening;unchanged 2013.(could not tolerate simvastatin, no other statins tried)--<30% blockage bilat 07/2011  . Recurrent UTI     has cystocele-Dr.Grewal  . Chronic fatigue and malaise   . Chronic abdominal pain   . 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 Tonsillectomy  1946  . Vaginal hysterectomy   1971    and bladder repair.  Still has ovaries  . Cataract extraction, bilateral  1995, 1996  . Thyroidectomy, partial  09/2005    L nodule; Dr. Gerrit Friends  . Upper gi endoscopy  06/27/12  . Flexible sigmoidoscopy      There were no vitals filed for this visit.  Visit Diagnosis:  Pain of left lower extremity  Pain of right lower extremity                       [facility] Adult PT Treatment/Exercise - 12/03/14 0001    Lumbar Exercises: Aerobic   Tread Mill x 5 min to increase steps  vc on increasing step length and heel toe   Lumbar Exercises: Prone   Other Prone Lumbar Exercises lay prone ti increase hip and knee extension but too painful x 1 min.    Manual Therapy   Manual Therapy Other (comment)   Massage to left piriformis and SI area in righ tsidely   Other Manual Therapy manually stretched bil. hamsring and hip adductors                PT Education - 12/03/14 1224    Education provided Yes   Education Details hip stretches   Person(s) Educated Patient   Methods Explanation;Demonstration;Tactile cues;Verbal cues;Handout  Comprehension Verbalized understanding;Returned demonstration          PT Short Term Goals - 12/01/14 0934    PT SHORT TERM GOAL #1   Title pain with daily activities decreased >/= 25%   Time 4   Period Weeks   Status On-going  No change   PT SHORT TERM GOAL #3   Title be able to go out to entertain with >/ =25% greater ease due to decreased pain   Time 4   Period Weeks   Status On-going  no change in pain   PT SHORT TERM GOAL #4   Title ability to ambulate with heel toe gait due to increased right knee extension >/=-5 degrees   Time 4   Period Weeks   Status On-going   knee extension -5 but still has trouble with heel toe gait.   PT SHORT TERM GOAL #5   Title understand ways to toilet correctly to decreased feeling of constipation and strain on prolapse   Time 4   Period Weeks   Status Achieved            PT Long Term Goals - 11/17/14 1327    PT LONG TERM GOAL #1   Title pain with daily activities decreased >/= 50%    Time 8   Period Weeks   Status New   PT LONG TERM GOAL #2   Title pelvic floor strength >/= 4/5   Time 8   Period Weeks   Status New   PT LONG TERM GOAL #3   Title move in bed with pain decreased  >/= 50%   Time 8   Period Weeks   Status New   PT LONG TERM GOAL #4   Title ambulate with bigger step length due to increased strength and increased hip mobility   Time 8   Period Weeks   Status New               Plan - 12/03/14 1228    Clinical Impression Statement Patient is a 81 year old female with tight hips and decreased mobility of right knee making it difficult for her to walk with increased step length.  Patient is able to walk with increased step length when walkingfaster.  Patient is unable to lay on her stomach due to hip and back pain.  Patient  has pain in left hip due to muscle spasm.    Pt will benefit from skilled therapeutic intervention in order to improve on the following deficits Abnormal gait;Decreased range of motion;Difficulty walking;Impaired flexibility;Postural dysfunction;Decreased endurance;Decreased activity tolerance;Increased fascial restricitons;Pain;Increased muscle spasms;Decreased mobility;Decreased strength   Rehab Potential Good   Clinical Impairments Affecting Rehab Potential None   PT Frequency 2x / week   PT Duration 8 weeks   PT Treatment/Interventions Moist Heat;Therapeutic activities;Patient/family education;Passive range of motion;Therapeutic exercise;Biofeedback;Ultrasound;Gait training;Manual techniques;Neuromuscular re-education;Cryotherapy;Electrical Stimulation;Functional mobility training   PT Next Visit Plan modalities for pain if needed, soft tissue work, exercises to increase hip mobility   PT Home Exercise Plan current HEP   Consulted and Agree with Plan of Care Patient        Problem List Patient Active  Problem List   Diagnosis Date Noted  . Epigastric fullness 10/12/2014  . Early satiety 10/12/2014  . Cystocele 12/30/2013  . IBS (irritable bowel syndrome) 09/30/2013  . GERD (gastroesophageal reflux disease) 06/27/2013  . PVC's (premature ventricular contractions) 10/02/2012  . Depressive disorder, not elsewhere classified 10/02/2012  . H. pylori infection 08/01/2012  .  Bradycardia 08/01/2012  . Fatigue 08/01/2012  . Anxiety state, unspecified 05/13/2012  . Osteopenia 05/13/2012  . Unspecified hypothyroidism 05/13/2012    Lashaya Kienitz,PT 12/03/2014, 12:31 PM  [facility] [address] [place], [place], [address] Phone: [phone]   Fax:  [phone]

## 2014-12-03 NOTE — Patient Instructions (Signed)
Hip Adductor: Wall Stretch   Lie on back with hips against wall, back of thighs on wall. Pull legs apart until stretch is felt in inner thighs. Hold _30__ seconds. Relax. Repeat _2__ times. Do __2_ times a day. Then keep legs straight hold 30 seconds 2 times Copyright  VHI. All rights reserved.  Hamstring: Wall Stretch   Lie on floor with both leg on wall, . Scoot buttocks toward wall until stretch is felt in back of thigh. Hold _30__ seconds. Relax. Repeat _2__ times. Do _1__ times a day. Repeat with other leg.    Copyright  VHI. All rights reserved.    Quad Set   With other leg bent, foot flat, slowly tighten muscles on thigh of straight leg while counting out loud to _5___. Repeat with other leg. Repeat _10__ times. Do __1__ sessions per day.  http://[email or web address].exer.us/276   Copyright  VHI. All rights reserved.

## 2014-12-04 ENCOUNTER — Encounter: Payer: Self-pay | Admitting: Gastroenterology

## 2014-12-04 ENCOUNTER — Ambulatory Visit (INDEPENDENT_AMBULATORY_CARE_PROVIDER_SITE_OTHER): Payer: Medicare Other | Admitting: Gastroenterology

## 2014-12-04 VITALS — BP 134/70 | HR 64 | Ht 58.75 in | Wt 110.0 lb

## 2014-12-04 DIAGNOSIS — R1012 Left upper quadrant pain: Secondary | ICD-10-CM | POA: Diagnosis not present

## 2014-12-04 NOTE — Patient Instructions (Addendum)
You will be set up for an upper endoscopy for LUQ pains, check for sprue; check for persistent H. Pylori.  You have been scheduled for an endoscopy. Please follow written instructions given to you at your visit today. If you use inhalers (even only as needed), please bring them with you on the day of your procedure. Your physician has requested that you go to www.startemmi.com and enter the access code given to you at your visit today. This web site gives a general overview about your procedure. However, you should still follow specific instructions given to you by our office regarding your preparation for the procedure.

## 2014-12-04 NOTE — Progress Notes (Signed)
Review of previous GI testing; Dr. Hung:  --CBC, CMP, and TSH were normal in 07/2014 and again in 08/2014. CT scan02/26/2016. This was done without contrast due to dye allergy. This showed no acute findings in the abdomen and pelvis specifically to explain her history of abdominal pain. There is a 3.0 cm left ovarian cystic lesion that is likely benign. As I stated above, she is following with gynecology and they're going to monitor that, but they think that it is benign. Stomach, duodenum, small bowel, and colon grossly unremarkable.Abdominal ultrasound on 09/04/2014 showed no acute or chronic abnormalities of the hepatobiliary tree; evaluation of the pancreatic tail was limited. There is no acute abnormalities of the kidney demonstrated. A 1.6 cm diameter mid to lower pole cyst was noted in the right kidney.August 2015 HIDA scan with CCK, which was normal. This was performed for complaints of upper abdominal pain, appetite loss, fatigue, and weight loss at that time as well.EGD was in December 2013 at which time she was found to have moderate gastritis. Biopsies actually showed H. Pylori with chronic active gastritis, however, after taking 1 dose of the Pylera medication she discontinued it because of side effects (it made her feel horrible). Therefore, she has not completed treatment for H. Pylori. Duodenal biopsies were unremarkable at that time.  flexible sigmoidoscopy also in December 2013 that was normal with only medium hemorrhoids noted. This was performed for diarrhea and biopsies of the descending and sigmoid colon were unremarkable. She states that she had a full colonoscopy prior to that with Dr. Hung, however, after calling his office they stated they have never performed a full colonoscopy on her. Gastric emptying scan in January 2014 showed 33% of the ingested activity remaining in the stomach which is mildly abnormal compared to the normal range of less than 30% of the ingested activity  remaining at 120 minutes.  Repeat GES 09/2014 was normal   HPI: This is a  very pleasant 81-year-old woman whom I am meeting for the first time today. She is here with her husband today.   Chief complaint is persistent left upper quadrant discomfort  She was here 2 months ago and met Jessica. Gastric emptying scan ordered at that date was normal.  She is still bothered with post prandial abd swelling, causes an ache.  Has a poor appetite.  Weight  Fluctuates.  No NSAIDs;  Asa causes abd discomfort.  Has gained 2 pounds since her last visit here in GI office (same scale)  Has had symptoms  For 5 months she says.  Past Medical History  Diagnosis Date  . Depression     treated in the past for years;stopped in 2010 for a years  . Panic attack   . Claustrophobia   . Fibromyalgia   . IBS (irritable bowel syndrome)     Dr. Hung  . Hypercholesteremia     with high HDL  . Glaucoma, narrow-angle     s/p laser surgery  . Duodenal ulcer 1962    h/o  . Hyperthyroidism     h/o x 2 years  . Shingles 1999    h/o  . Hypothyroid 9/08  . GERD (gastroesophageal reflux disease)   . Trochanteric bursitis 12/2008    bilateral  . Superficial thrombophlebitis 03/2009    RLE  . Bell's palsy 1966  . Osteoporosis 10/11    Dr.Hawkes  . SLE (systemic lupus erythematosus)     with arthralgia/myalgia, positive ANA centromere pattern, positive DNA, positive RNP,   indeterminant anti-cardiolipin antibody-Dr.Hawkes.  . Carotid artery disease 2010    on vascular screening;unchanged 2013.(could not tolerate simvastatin, no other statins tried)--<30% blockage bilat 07/2011  . Recurrent UTI     has cystocele-Dr.Grewal  . Chronic fatigue and malaise   . Chronic abdominal pain   . Frequent PVCs 07/2012    Seen by [facility]: benign, asymptomatic, normal EF    Past Surgical History  Procedure Laterality Date  . Tonsillectomy  1946  . Vaginal hysterectomy  1971    and bladder repair.  Still has  ovaries  . Cataract extraction, bilateral  1995, 1996  . Thyroidectomy, partial  09/2005    L nodule; Dr. Harlow Asa  . Upper gi endoscopy  06/27/12  . Flexible sigmoidoscopy      Current Outpatient Prescriptions  Medication Sig Dispense Refill  . acetaminophen (TYLENOL) 650 MG CR tablet Take 650 mg by mouth as needed for pain.    Marland Kitchen ALPRAZolam (XANAX) 0.5 MG tablet Take 0.25 mg by mouth 2 (two) times daily as needed.     . ARTIFICIAL TEAR OP Apply 1-2 drops to eye 4 (four) times daily as needed (dry eyes).     Marland Kitchen b complex vitamins capsule Take 1 capsule by mouth daily.     . Biotin 1000 MCG tablet Take 1,000 mcg by mouth daily.    . cholecalciferol (VITAMIN D) 1000 UNITS tablet Take 1,000 Units by mouth 2 (two) times daily.     . Magnesium 250 MG TABS Take 1 tablet by mouth daily.    . polyethylene glycol (MIRALAX / GLYCOLAX) packet Take 17 g by mouth as needed.     Marland Kitchen SYNTHROID 50 MCG tablet Take 0.5-1 tablets (25-50 mcg total) by mouth daily before breakfast. Take half tablet Mon, Wed, Fri, full tablet on Tues, Thurs ,Sat and Sun 90 tablet 1  . temazepam (RESTORIL) 15 MG capsule Take 15 mg by mouth at bedtime as needed for sleep.    . vitamin E 400 UNIT capsule Take 400 Units by mouth every Monday, Wednesday, and Friday. 3 times weekly    . Loperamide HCl (IMODIUM PO) Take 1 tablet by mouth as needed.     No current facility-administered medications for this visit.    Allergies as of 12/04/2014 - Review Complete 12/04/2014  Allergen Reaction Noted  . Salmon [fish allergy] Hives and Shortness Of Breath 05/13/2012  . Shellfish allergy Anaphylaxis 05/13/2012  . Aspirin Other (See Comments) 05/13/2012  . Ciprofloxacin Diarrhea 05/13/2012  . Codeine Nausea And Vomiting 05/13/2012  . Darvocet [propoxyphene n-acetaminophen] Nausea And Vomiting 05/13/2012  . Demerol [meperidine] Nausea And Vomiting 05/13/2012  . Diphedryl [diphenhydramine] Other (See Comments) 05/13/2012  . Doxycycline  hyclate Other (See Comments) 05/13/2012  . Epinephrine Other (See Comments) 05/13/2012  . Erythromycin Other (See Comments) 05/13/2012  . Flexeril [cyclobenzaprine] Other (See Comments) 05/13/2012  . Iodine  05/13/2012  . Keflex [cephalexin] Hives 05/13/2012  . Latex Other (See Comments) 05/13/2012  . Prednisone Other (See Comments) 05/13/2012  . Pylera [bis subcit-metronid-tetracyc] Swelling 08/01/2012  . Sulfa antibiotics Other (See Comments) 05/13/2012  . Xylocaine [lidocaine hcl]  05/13/2012  . Zoloft [sertraline hcl] Swelling and Other (See Comments) 05/13/2012  . Advil [ibuprofen] Rash 05/13/2012  . Penicillins Hives and Rash 05/13/2012    Family History  Problem Relation Age of Onset  . Heart disease Mother   . Hypertension Mother   . Hypertension Sister   . Diabetes Maternal Grandfather   . Heart disease Brother   .  Cancer Brother     lung    History   Social History  . Marital Status: Married    Spouse Name: N/A  . Number of Children: 2  . Years of Education: N/A   Occupational History  . retired (school system)    Social History Main Topics  . Smoking status: Never Smoker   . Smokeless tobacco: Never Used  . Alcohol Use: No  . Drug Use: No  . Sexual Activity: Not on file   Other Topics Concern  . Not on file   Social History Narrative   Married.  Son lives in [place]; Daughter Lisa lives in [place]; 2 grandchildren     Physical Exam: Ht 4' 10.75" (1.492 m)  Wt 110 lb (49.896 kg)  BMI 22.41 kg/m2 Constitutional: generally well-appearing Psychiatric: alert and oriented x3 Abdomen: soft, nontender, nondistended, no obvious ascites, no peritoneal signs, normal bowel sounds   Assessment and plan: 81 y.o. female with dyspepsia, left upper quadrant discomfort  She was H. pylori positive on EGD biopsies 3 years ago and never really completed treatment. I think it is very likely that she still has persistent H pylori. She has a history of duodenal  ulcers also. She has a long list of medication allergies and intolerances. I am concerned that if she has recurrent H. pylori or actually residual H. pylori that I would not be able to pick out appropriate antibiotics for her and she would require infectious disease consultation. I think before proceeding to that I would like to make sure that she does indeed have persistent H. pylori and also check for recurrent duodenal ulcers which she says she has had in the past. Probably we'll also biopsy for celiac sprue at the same time since it can cause some dyspepsia as well.    , MD [facility] 12/04/2014, 11:18 AM    

## 2014-12-07 ENCOUNTER — Encounter: Payer: Self-pay | Admitting: Physical Therapy

## 2014-12-07 ENCOUNTER — Ambulatory Visit (INDEPENDENT_AMBULATORY_CARE_PROVIDER_SITE_OTHER): Payer: Medicare Other | Admitting: Licensed Clinical Social Worker

## 2014-12-07 ENCOUNTER — Ambulatory Visit: Payer: Medicare Other | Admitting: Physical Therapy

## 2014-12-07 DIAGNOSIS — R102 Pelvic and perineal pain: Secondary | ICD-10-CM | POA: Diagnosis not present

## 2014-12-07 DIAGNOSIS — M810 Age-related osteoporosis without current pathological fracture: Secondary | ICD-10-CM | POA: Diagnosis not present

## 2014-12-07 DIAGNOSIS — F419 Anxiety disorder, unspecified: Secondary | ICD-10-CM | POA: Diagnosis not present

## 2014-12-07 DIAGNOSIS — M79604 Pain in right leg: Secondary | ICD-10-CM

## 2014-12-07 DIAGNOSIS — M6289 Other specified disorders of muscle: Secondary | ICD-10-CM

## 2014-12-07 DIAGNOSIS — M329 Systemic lupus erythematosus, unspecified: Secondary | ICD-10-CM | POA: Diagnosis not present

## 2014-12-07 DIAGNOSIS — M79605 Pain in left leg: Secondary | ICD-10-CM

## 2014-12-07 DIAGNOSIS — M5416 Radiculopathy, lumbar region: Secondary | ICD-10-CM | POA: Diagnosis not present

## 2014-12-07 DIAGNOSIS — M797 Fibromyalgia: Secondary | ICD-10-CM | POA: Diagnosis not present

## 2014-12-07 DIAGNOSIS — K589 Irritable bowel syndrome without diarrhea: Secondary | ICD-10-CM | POA: Diagnosis not present

## 2014-12-07 NOTE — Patient Instructions (Signed)
Adduction: Hip - Knees Together With Pelvic Floor (Hook-Lying)   Lie with hips and knees bent, towel roll between knees. Squeeze pelvic floor while pushing knees together. Hold for __5_ seconds. Rest for _5__ seconds. Repeat 10___ times. Do __1_ times a day.   Copyright  VHI. All rights reserved.  Pelvic Tilt (Sitting)   Sit on ball. Tighten pelvic floor and hold. Roll ball slightly forward by tilting pelvis backwards. Relax. Repeat _10__ times. Do _1__ times a day. Advanced: Continue to hold squeeze through repetitions.  Copyright  VHI. All rights reserved.  Side Tilt (Sitting)   Sit on ball. Tighten pelvic floor and hold. Roll ball slightly to one side by tilting pelvis the opposite direction. Repeat to other side. Relax. Repeat _10__ times. Do _1__ times a day. Advanced: Continue to hold squeeze through repetitions.  Copyright  VHI. All rights reserved.  Diagonal (Sitting)   Sit on ball. Tighten pelvic floor and hold. Guiding with legs, move ball diagonally to left side. Relax. Repeat 10___ times. Do _1__ times a day. Alternate: Form a "V" by moving diagonal to one side then to other. Advanced: Continue to hold squeeze through repetitions.   Copyright  VHI. All rights reserved.

## 2014-12-07 NOTE — Therapy (Signed)
[facility] [address] [place], [place], [address] Phone: [phone]   Fax:  [phone]  Physical Therapy Treatment  Patient Details  Name: Tracy Malone MRN: [id number] Date of Birth: Jul 03, 1932 Referring Provider:  Dian Queen, MD  Encounter Date: 12/07/2014      PT End of Session - 12/07/14 1216    Visit Number 5   Number of Visits 10  Medicare   Date for PT Re-Evaluation 01/12/15   PT Start Time 7169   PT Stop Time 1225   PT Time Calculation (min) 40 min   Activity Tolerance Patient tolerated treatment well   Behavior During Therapy [facility] for tasks assessed/performed      Past Medical History  Diagnosis Date  . Depression     treated in the past for years;stopped in 2010 for a years  . Panic attack   . Claustrophobia   . Fibromyalgia   . IBS (irritable bowel syndrome)     Dr. Benson Norway  . Hypercholesteremia     with high HDL  . Glaucoma, narrow-angle     s/p laser surgery  . Duodenal ulcer 1962    h/o  . Hyperthyroidism     h/o x 2 years  . Shingles 1999    h/o  . Hypothyroid 9/08  . GERD (gastroesophageal reflux disease)   . Trochanteric bursitis 12/2008    bilateral  . Superficial thrombophlebitis 03/2009    RLE  . Bell's palsy 1966  . Osteoporosis 10/11    Dr.Hawkes  . SLE (systemic lupus erythematosus)     with arthralgia/myalgia, positive ANA centromere pattern, positive DNA, positive RNP, indeterminant anti-cardiolipin antibody-Dr.Hawkes.  . Carotid artery disease 2010    on vascular screening;unchanged 2013.(could not tolerate simvastatin, no other statins tried)--<30% blockage bilat 07/2011  . Recurrent UTI     has cystocele-Dr.Grewal  . Chronic fatigue and malaise   . Chronic abdominal pain   . 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 Tonsillectomy  1946  . Vaginal hysterectomy   1971    and bladder repair.  Still has ovaries  . Cataract extraction, bilateral  1995, 1996  . Thyroidectomy, partial  09/2005    L nodule; Dr. Harlow Asa  . Upper gi endoscopy  06/27/12  . Flexible sigmoidoscopy      There were no vitals filed for this visit.  Visit Diagnosis:  Pain of left lower extremity  Pain of right lower extremity  PFD (pelvic floor dysfunction)      Subjective Assessment - 12/07/14 1152    Subjective I was unable to perform my last 2 exercises.  Patient reports she has tingling in bilateral legs for 30 minutes and have not had it again. I am getting better with straightening my right knee.    Pertinent History Osteoporosis; irritable bowel   Limitations Sitting   How long can you sit comfortably? 30 min   How long can you stand comfortably? No difficulty   How long can you walk comfortably? no difficulty   Diagnostic tests CAT scan that showed cyst on ovary   Patient Stated Goals reduce pain with activities   Currently in Pain? Yes   Pain Score 4    Pain Location Hip   Pain Orientation Right;Left;Lower   Pain Descriptors / Indicators Constant;Jabbing   Pain Type Chronic pain   Pain Onset More than  a month ago   Pain Frequency Constant   Aggravating Factors  getting in and out of the car   Pain Relieving Factors Motrin   Multiple Pain Sites No                         [facility] Adult PT Treatment/Exercise - 12/07/14 0001    Lumbar Exercises: Stretches   Passive Hamstring Stretch 2 reps;30 seconds  bil.    Piriformis Stretch 2 reps;30 seconds  bil. manually   Knee/Hip Exercises: Supine   Bridges Strengthening;10 reps  discomfort   Other Supine Knee Exercises hip adductor stretch hold 30 sec 2 times each leg   Manual Therapy   Manual Therapy Soft tissue mobilization   Soft tissue mobilization to bil. obturator internist and levator ani                PT Education - 12/07/14 1220    Education provided Yes   Education  Details pelvic floor contraction, sitting on physioball with pelvic mobility and strengthening   Person(s) Educated Patient   Methods Explanation;Demonstration;Tactile cues;Verbal cues;Handout   Comprehension Verbalized understanding;Returned demonstration          PT Short Term Goals - 12/07/14 1155    PT SHORT TERM GOAL #1   Title pain with daily activities decreased >/= 25%   Time 4   Period Weeks   Status Achieved   PT SHORT TERM GOAL #2   Title pelvic floor strength >/= 3/5 holding for 8 seconds   Time 4   Period Weeks   Status New   PT SHORT TERM GOAL #3   Title be able to go out to entertain with >/ =25% greater ease due to decreased pain   Time 4   Period Weeks   Status Achieved   PT SHORT TERM GOAL #4   Title ability to ambulate with heel toe gait due to increased right knee extension >/=-5 degrees   Time 4   Period Weeks   Status On-going           PT Long Term Goals - 11/17/14 1327    PT LONG TERM GOAL #1   Title pain with daily activities decreased >/= 50%    Time 8   Period Weeks   Status New   PT LONG TERM GOAL #2   Title pelvic floor strength >/= 4/5   Time 8   Period Weeks   Status New   PT LONG TERM GOAL #3   Title move in bed with pain decreased  >/= 50%   Time 8   Period Weeks   Status New   PT LONG TERM GOAL #4   Title ambulate with bigger step length due to increased strength and increased hip mobility   Time 8   Period Weeks   Status New               Plan - 12/07/14 1221    Clinical Impression Statement Patient is a 81 year old female with pelvic and bil. leg pain.  Patient has reports getting in and out of bed is 70% easier.  Patient reports her pain with daily activities has decreased by 25%. Patient has a physioball at home and has learned exercises to strengthen the pelvic floor. Patient still ambulates with decreased step length and decreased heel toe. Patient has met STG number 1 and 3.    Pt will benefit from skilled  therapeutic intervention  in order to improve on the following deficits Abnormal gait;Decreased range of motion;Difficulty walking;Impaired flexibility;Postural dysfunction;Decreased endurance;Decreased activity tolerance;Increased fascial restricitons;Pain;Increased muscle spasms;Decreased mobility;Decreased strength   Rehab Potential Good   Clinical Impairments Affecting Rehab Potential None   PT Frequency 2x / week   PT Duration 8 weeks   PT Treatment/Interventions Moist Heat;Therapeutic activities;Patient/family education;Passive range of motion;Therapeutic exercise;Biofeedback;Ultrasound;Gait training;Manual techniques;Neuromuscular re-education;Cryotherapy;Electrical Stimulation;Functional mobility training   PT Next Visit Plan modalities as needed, internal soft tissue work, measure pelvic floor strengt   PT Home Exercise Plan current HEP        Problem List Patient Active Problem List   Diagnosis Date Noted  . Epigastric fullness 10/12/2014  . Early satiety 10/12/2014  . Cystocele 12/30/2013  . IBS (irritable bowel syndrome) 09/30/2013  . GERD (gastroesophageal reflux disease) 06/27/2013  . PVC's (premature ventricular contractions) 10/02/2012  . Depressive disorder, not elsewhere classified 10/02/2012  . H. pylori infection 08/01/2012  . Bradycardia 08/01/2012  . Fatigue 08/01/2012  . Anxiety state, unspecified 05/13/2012  . Osteopenia 05/13/2012  . Unspecified hypothyroidism 05/13/2012    GRAY,CHERYL,PT 12/07/2014, 12:27 PM  [facility] [address], [place], [place], [address] Phone: [phone]   Fax:  [phone]

## 2014-12-08 ENCOUNTER — Encounter: Payer: Medicare Other | Admitting: Physical Therapy

## 2014-12-08 DIAGNOSIS — F411 Generalized anxiety disorder: Secondary | ICD-10-CM | POA: Diagnosis not present

## 2014-12-09 ENCOUNTER — Encounter: Payer: Self-pay | Admitting: Gastroenterology

## 2014-12-09 ENCOUNTER — Ambulatory Visit (AMBULATORY_SURGERY_CENTER): Payer: Medicare Other | Admitting: Gastroenterology

## 2014-12-09 ENCOUNTER — Telehealth: Payer: Self-pay | Admitting: Internal Medicine

## 2014-12-09 ENCOUNTER — Other Ambulatory Visit: Payer: Self-pay | Admitting: Gastroenterology

## 2014-12-09 ENCOUNTER — Telehealth: Payer: Self-pay | Admitting: Gastroenterology

## 2014-12-09 VITALS — BP 147/61 | HR 138 | Temp 98.0°F | Resp 18 | Ht <= 58 in | Wt 110.0 lb

## 2014-12-09 DIAGNOSIS — B9681 Helicobacter pylori [H. pylori] as the cause of diseases classified elsewhere: Secondary | ICD-10-CM | POA: Diagnosis not present

## 2014-12-09 DIAGNOSIS — K2990 Gastroduodenitis, unspecified, without bleeding: Secondary | ICD-10-CM

## 2014-12-09 DIAGNOSIS — R1012 Left upper quadrant pain: Secondary | ICD-10-CM | POA: Diagnosis not present

## 2014-12-09 DIAGNOSIS — R1013 Epigastric pain: Secondary | ICD-10-CM | POA: Diagnosis not present

## 2014-12-09 DIAGNOSIS — K2950 Unspecified chronic gastritis without bleeding: Secondary | ICD-10-CM | POA: Diagnosis not present

## 2014-12-09 DIAGNOSIS — K2970 Gastritis, unspecified, without bleeding: Secondary | ICD-10-CM

## 2014-12-09 DIAGNOSIS — E039 Hypothyroidism, unspecified: Secondary | ICD-10-CM

## 2014-12-09 MED ORDER — SODIUM CHLORIDE 0.9 % IV SOLN: 500.0000 mL | INTRAVENOUS | Status: DC

## 2014-12-09 MED ORDER — SYNTHROID 50 MCG PO TABS: 25.0000 ug | ORAL_TABLET | Freq: Every day | ORAL | Status: DC

## 2014-12-09 NOTE — Progress Notes (Signed)
Called to room to assist during endoscopic procedure.  Patient ID and intended procedure confirmed with present staff. Received instructions for my participation in the procedure from the performing physician.  

## 2014-12-09 NOTE — Patient Instructions (Signed)
YOU HAD AN ENDOSCOPIC PROCEDURE TODAY AT THE [facility]:   Refer to the procedure report that was given to you for any specific questions about what was found during the examination.  If the procedure report does not answer your questions, please call your gastroenterologist to clarify.  If you requested that your care partner not be given the details of your procedure findings, then the procedure report has been included in a sealed envelope for you to review at your convenience later.  YOU SHOULD EXPECT: Some feelings of bloating in the abdomen. Passage of more gas than usual.  Walking can help get rid of the air that was put into your GI tract during the procedure and reduce the bloating. If you had a lower endoscopy (such as a colonoscopy or flexible sigmoidoscopy) you may notice spotting of blood in your stool or on the toilet paper. If you underwent a bowel prep for your procedure, you may not have a normal bowel movement for a few days.  Please Note:  You might notice some irritation and congestion in your nose or some drainage.  This is from the oxygen used during your procedure.  There is no need for concern and it should clear up in a day or so.  SYMPTOMS TO REPORT IMMEDIATELY:   Following upper endoscopy (EGD)  Vomiting of blood or coffee ground material  New chest pain or pain under the shoulder blades  Painful or persistently difficult swallowing  New shortness of breath  Fever of 100F or higher  Black, tarry-looking stools  For urgent or emergent issues, a gastroenterologist can be reached at any hour by calling [phone].   DIET: Your first meal following the procedure should be a small meal and then it is ok to progress to your normal diet. Heavy or fried foods are harder to digest and may make you feel nauseous or bloated.  Likewise, meals heavy in dairy and vegetables can increase bloating.  Drink plenty of fluids but you should avoid alcoholic beverages for  24 hours.  ACTIVITY:  You should plan to take it easy for the rest of today and you should NOT DRIVE or use heavy machinery until tomorrow (because of the sedation medicines used during the test).    FOLLOW UP: Our staff will call the number listed on your records the next business day following your procedure to check on you and address any questions or concerns that you may have regarding the information given to you following your procedure. If we do not reach you, we will leave a message.  However, if you are feeling well and you are not experiencing any problems, there is no need to return our call.  We will assume that you have returned to your regular daily activities without incident.  If any biopsies were taken you will be contacted by phone or by letter within the next 1-3 weeks.  Please call us at [phone] if you have not heard about the biopsies in 3 weeks.    SIGNATURES/CONFIDENTIALITY: You and/or your care partner have signed paperwork which will be entered into your electronic medical record.  These signatures attest to the fact that that the information above on your After Visit Summary has been reviewed and is understood.  Full responsibility of the confidentiality of this discharge information lies with you and/or your care-partner.  Please review gastritis handout provided. Biopsy results pending. 

## 2014-12-09 NOTE — Telephone Encounter (Signed)
This came in, she has TSH in [place] wondering if she is due for med check?

## 2014-12-09 NOTE — Telephone Encounter (Signed)
Refill request for synthroid 0.05mg  to [facility]

## 2014-12-09 NOTE — Op Note (Signed)
[place]  [address] [place], [address]   ENDOSCOPY PROCEDURE REPORT  PATIENT: Tracy Malone, Tracy Malone  MR#: [id number] BIRTHDATE: Oct 17, 1931 , 83  yrs. old GENDER: female ENDOSCOPIST: Milus Banister, MD PROCEDURE DATE:  12/09/2014 PROCEDURE:  EGD w/ biopsy ASA CLASS:     Class III INDICATIONS:  LUQ pain, dyspepsia, previous EGD (Dr.  Benson Norway 2013) found H.  pylori + gastritis. MEDICATIONS: Monitored anesthesia care and Propofol 100 mg IV TOPICAL ANESTHETIC: none  DESCRIPTION OF PROCEDURE: After the risks benefits and alternatives of the procedure were thoroughly explained, informed consent was obtained.  The LB [id number] endoscope was introduced through the mouth and advanced to the second portion of the duodenum , Without limitations.  The instrument was slowly withdrawn as the mucosa was fully examined.  There was mild to moderate non-specific pan-gastritis.  This was biopsied and sent to pathology.  There was a small (1cm) hiatal hernia.  The examination was otherwise normal.  Retroflexed views revealed no abnormalities.     The scope was then withdrawn from the patient and the procedure completed.  COMPLICATIONS: There were no immediate complications.  ENDOSCOPIC IMPRESSION: There was mild to moderate non-specific pan-gastritis.  This was biopsied and sent to pathology.  There was a small (1cm) hiatal hernia.  The examination was otherwise normal  RECOMMENDATIONS: If biopsies prove residual/recurrent H.  pylori, will likely refer you to the ID clinic given your extensive allergy list.   eSigned:  Milus Banister, MD 12/09/2014 10:48 AM    CC: [name], MD

## 2014-12-09 NOTE — Telephone Encounter (Signed)
Done and left message

## 2014-12-09 NOTE — Progress Notes (Signed)
Pt has irregular heart rate. Pt placed on monitor. John Nulty,CRNA in room and observed monitor states pt has PVC's but regular heart rate. John spoke with Dr Ardis Hughs. States ok to proceed with EGD.

## 2014-12-09 NOTE — Telephone Encounter (Signed)
Last TSH was January.   Last AWV was 09/2013.  Medcheck (plus, if has no GYN) and AWV is recommended to be scheduled at some point in the next 6 months.  Can refill synthroid until January.

## 2014-12-09 NOTE — Progress Notes (Signed)
A/ox3 pleased with MAC, report to Wendy RN 

## 2014-12-09 NOTE — Telephone Encounter (Signed)
Pt was told to try OTC omeprazole or zantac/pepcid and we will call as soon as biopsy is back with Dr Ardis Hughs recommendations

## 2014-12-10 ENCOUNTER — Telehealth: Payer: Self-pay

## 2014-12-10 ENCOUNTER — Encounter: Payer: Self-pay | Admitting: Physical Therapy

## 2014-12-10 ENCOUNTER — Telehealth: Payer: Self-pay | Admitting: Gastroenterology

## 2014-12-10 ENCOUNTER — Ambulatory Visit: Payer: Medicare Other | Admitting: Physical Therapy

## 2014-12-10 DIAGNOSIS — M5416 Radiculopathy, lumbar region: Secondary | ICD-10-CM | POA: Diagnosis not present

## 2014-12-10 DIAGNOSIS — M329 Systemic lupus erythematosus, unspecified: Secondary | ICD-10-CM | POA: Diagnosis not present

## 2014-12-10 DIAGNOSIS — R102 Pelvic and perineal pain: Secondary | ICD-10-CM | POA: Diagnosis not present

## 2014-12-10 DIAGNOSIS — M810 Age-related osteoporosis without current pathological fracture: Secondary | ICD-10-CM | POA: Diagnosis not present

## 2014-12-10 DIAGNOSIS — M79605 Pain in left leg: Secondary | ICD-10-CM

## 2014-12-10 DIAGNOSIS — M79604 Pain in right leg: Secondary | ICD-10-CM

## 2014-12-10 DIAGNOSIS — M797 Fibromyalgia: Secondary | ICD-10-CM | POA: Diagnosis not present

## 2014-12-10 DIAGNOSIS — K589 Irritable bowel syndrome without diarrhea: Secondary | ICD-10-CM | POA: Diagnosis not present

## 2014-12-10 NOTE — Telephone Encounter (Signed)
  Follow up Call-  Call back number 12/09/2014  Post procedure Call Back phone  # [phone]  Permission to leave phone message Yes     Patient questions:  Do you have a fever, pain , or abdominal swelling? No. Pain Score  0 *  Have you tolerated food without any problems? Yes.    Have you been able to return to your normal activities? Yes.    Do you have any questions about your discharge instructions: Diet   No. Medications  No. Follow up visit  No.  Do you have questions or concerns about your Care? No.  Actions: * If pain score is 4 or above: No action needed, pain <4.  Patient stated had a episode of diarrhea after procedure. Patient had a EGD . Patient called yesterday and spoke with nurse. Instructed to call back if needed.

## 2014-12-10 NOTE — Telephone Encounter (Signed)
Early Chars you spoke to this pt this morning and she is now complaining of a hematoma an the hand from the IV placement.  Can you please call her?

## 2014-12-10 NOTE — Therapy (Signed)
[facility] [address] [place], [place], [address] Phone: [phone]   Fax:  [phone]  Physical Therapy Treatment  Patient Details  Name: Tracy Malone MRN: [id number] Date of Birth: [birth year] Referring Provider:  Dian Queen, MD  Encounter Date: 12/10/2014      PT End of Session - 12/10/14 1207    Visit Number 6   Number of Visits 10  Medicare   Date for PT Re-Evaluation 01/12/15   PT Start Time 4010   PT Stop Time 1210   PT Time Calculation (min) 25 min   Activity Tolerance Patient limited by fatigue;Patient limited by pain   Behavior During Therapy [facility] for tasks assessed/performed      Past Medical History  Diagnosis Date  . Depression     treated in the past for years;stopped in 2010 for a years  . Panic attack   . Claustrophobia   . Fibromyalgia   . IBS (irritable bowel syndrome)     Dr. Benson Norway  . Hypercholesteremia     with high HDL  . Glaucoma, narrow-angle     s/p laser surgery  . Duodenal ulcer 1962    h/o  . Hyperthyroidism     h/o x 2 years  . Shingles 1999    h/o  . Hypothyroid 9/08  . GERD (gastroesophageal reflux disease)   . Trochanteric bursitis 12/2008    bilateral  . Superficial thrombophlebitis 03/2009    RLE  . Bell's palsy 1966  . Osteoporosis 10/11    Dr.Hawkes  . SLE (systemic lupus erythematosus)     with arthralgia/myalgia, positive ANA centromere pattern, positive DNA, positive RNP, indeterminant anti-cardiolipin antibody-Dr.Hawkes.  . Carotid artery disease 2010    on vascular screening;unchanged 2013.(could not tolerate simvastatin, no other statins tried)--<30% blockage bilat 07/2011  . Recurrent UTI     has cystocele-Dr.Grewal  . Chronic fatigue and malaise   . Chronic abdominal pain   . Frequent PVCs 07/2012    Seen by  Cards: benign, asymptomatic, normal EF    Past Surgical History  Procedure Laterality Date  . Tonsillectomy  1946  .  Vaginal hysterectomy  1971    and bladder repair.  Still has ovaries  . Cataract extraction, bilateral  1995, 1996  . Thyroidectomy, partial  09/2005    L nodule; Dr. Harlow Asa  . Upper gi endoscopy  06/27/12  . Flexible sigmoidoscopy      There were no vitals filed for this visit.  Visit Diagnosis:  Pain of left lower extremity  Pain of right lower extremity      Subjective Assessment - 12/10/14 1153    Subjective Yesterday I had tests done on colon.  Endoscopy on my stomach and do not feel well today.    Pertinent History Osteoporosis; irritable bowel   Limitations Sitting   How long can you sit comfortably? 30 min   How long can you stand comfortably? No difficulty   How long can you walk comfortably? no difficulty   Diagnostic tests CAT scan that showed cyst on ovary   Patient Stated Goals reduce pain with activities   Currently in Pain? Yes   Pain Score 6    Pain Location Pelvis  abdomen   Pain Orientation Anterior;Lower   Pain Descriptors / Indicators Aching   Pain Type Chronic pain   Pain Onset More than a month ago   Pain Frequency Intermittent   Aggravating Factors  getting in and out of  car   Pain Relieving Factors eating   Multiple Pain Sites No                         [facility] Adult PT Treatment/Exercise - 12/10/14 0001    Lumbar Exercises: Stretches   Passive Hamstring Stretch 2 reps;30 seconds  bil.    Piriformis Stretch 2 reps;30 seconds  bil. manually   Lumbar Exercises: Supine   Bridge 5 reps  pain   Manual Therapy   Other Manual Therapy manually stretched bil. hamsring and hip adductors, pirifornis, gastroc                PT Education - 12/10/14 1214    Education provided No          PT Short Term Goals - 12/07/14 1155    PT SHORT TERM GOAL #1   Title pain with daily activities decreased >/= 25%   Time 4   Period Weeks   Status Achieved   PT SHORT TERM GOAL #2   Title pelvic floor strength >/= 3/5 holding for 8  seconds   Time 4   Period Weeks   Status New   PT SHORT TERM GOAL #3   Title be able to go out to entertain with >/ =25% greater ease due to decreased pain   Time 4   Period Weeks   Status Achieved   PT SHORT TERM GOAL #4   Title ability to ambulate with heel toe gait due to increased right knee extension >/=-5 degrees   Time 4   Period Weeks   Status On-going           PT Long Term Goals - 11/17/14 1327    PT LONG TERM GOAL #1   Title pain with daily activities decreased >/= 50%    Time 8   Period Weeks   Status New   PT LONG TERM GOAL #2   Title pelvic floor strength >/= 4/5   Time 8   Period Weeks   Status New   PT LONG TERM GOAL #3   Title move in bed with pain decreased  >/= 50%   Time 8   Period Weeks   Status New   PT LONG TERM GOAL #4   Title ambulate with bigger step length due to increased strength and increased hip mobility   Time 8   Period Weeks   Status New               Plan - 12/10/14 1212    Clinical Impression Statement Patient was not able to tolerate treatment today due to not feeling well from her endoscopy.  Patient was able to tolerate stretches then after that her stomach was not feeling well.  Next visit we will resume therapy.    Pt will benefit from skilled therapeutic intervention in order to improve on the following deficits Abnormal gait;Decreased range of motion;Difficulty walking;Impaired flexibility;Postural dysfunction;Decreased endurance;Decreased activity tolerance;Increased fascial restricitons;Pain;Increased muscle spasms;Decreased mobility;Decreased strength   Clinical Impairments Affecting Rehab Potential None   PT Frequency 2x / week   PT Duration 8 weeks   PT Treatment/Interventions Moist Heat;Therapeutic activities;Patient/family education;Passive range of motion;Therapeutic exercise;Biofeedback;Ultrasound;Gait training;Manual techniques;Neuromuscular re-education;Cryotherapy;Electrical Stimulation;Functional mobility  training   PT Next Visit Plan modalities as needed, internal soft tissue work, measure pelvic floor strengt   PT Home Exercise Plan current HEP; add information on osteoporsis   Consulted and Agree with Plan of Care --  Problem List Patient Active Problem List   Diagnosis Date Noted  . Epigastric fullness 10/12/2014  . Early satiety 10/12/2014  . Cystocele 12/30/2013  . IBS (irritable bowel syndrome) 09/30/2013  . GERD (gastroesophageal reflux disease) 06/27/2013  . PVC's (premature ventricular contractions) 10/02/2012  . Depressive disorder, not elsewhere classified 10/02/2012  . H. pylori infection 08/01/2012  . Bradycardia 08/01/2012  . Fatigue 08/01/2012  . Anxiety state, unspecified 05/13/2012  . Osteopenia 05/13/2012  . Unspecified hypothyroidism 05/13/2012    GRAY,CHERYL,PT 12/10/2014, 12:16 PM  [facility] [address], [place], [place], [address] Phone: [phone]   Fax:  [phone]

## 2014-12-11 DIAGNOSIS — L570 Actinic keratosis: Secondary | ICD-10-CM | POA: Diagnosis not present

## 2014-12-15 ENCOUNTER — Ambulatory Visit: Payer: Medicare Other | Admitting: Physical Therapy

## 2014-12-16 DIAGNOSIS — M797 Fibromyalgia: Secondary | ICD-10-CM | POA: Diagnosis not present

## 2014-12-16 DIAGNOSIS — M329 Systemic lupus erythematosus, unspecified: Secondary | ICD-10-CM | POA: Diagnosis not present

## 2014-12-16 DIAGNOSIS — M25569 Pain in unspecified knee: Secondary | ICD-10-CM | POA: Diagnosis not present

## 2014-12-16 DIAGNOSIS — M5416 Radiculopathy, lumbar region: Secondary | ICD-10-CM | POA: Diagnosis not present

## 2014-12-16 NOTE — Telephone Encounter (Signed)
Spoke with pt today.  Hematoma is still there, but area is "going down".  No numbness to fingers and able to move fingers without difficulty.  Advised her to apply heat, which she states she has been doing.  Told her to call back with any further problems.

## 2014-12-17 ENCOUNTER — Other Ambulatory Visit: Payer: Self-pay

## 2014-12-17 ENCOUNTER — Ambulatory Visit: Payer: Medicare Other | Admitting: Physical Therapy

## 2014-12-17 ENCOUNTER — Encounter: Payer: Self-pay | Admitting: Physical Therapy

## 2014-12-17 DIAGNOSIS — M810 Age-related osteoporosis without current pathological fracture: Secondary | ICD-10-CM | POA: Diagnosis not present

## 2014-12-17 DIAGNOSIS — M329 Systemic lupus erythematosus, unspecified: Secondary | ICD-10-CM | POA: Diagnosis not present

## 2014-12-17 DIAGNOSIS — R102 Pelvic and perineal pain: Secondary | ICD-10-CM | POA: Diagnosis not present

## 2014-12-17 DIAGNOSIS — K589 Irritable bowel syndrome without diarrhea: Secondary | ICD-10-CM | POA: Diagnosis not present

## 2014-12-17 DIAGNOSIS — M79605 Pain in left leg: Secondary | ICD-10-CM

## 2014-12-17 DIAGNOSIS — M5416 Radiculopathy, lumbar region: Secondary | ICD-10-CM | POA: Diagnosis not present

## 2014-12-17 DIAGNOSIS — A048 Other specified bacterial intestinal infections: Secondary | ICD-10-CM

## 2014-12-17 DIAGNOSIS — M79604 Pain in right leg: Secondary | ICD-10-CM

## 2014-12-17 DIAGNOSIS — M797 Fibromyalgia: Secondary | ICD-10-CM | POA: Diagnosis not present

## 2014-12-17 NOTE — Therapy (Signed)
[facility] [address], [place], [place], [address] Phone: [phone]   Fax:  [phone]  Physical Therapy Treatment  Patient Details  Name: Tracy Malone MRN: [id number] Date of Birth: [birth year] Referring Provider:  Dian Queen, MD  Encounter Date: 12/17/2014      PT End of Session - 12/17/14 1222    Visit Number 7   Number of Visits 10  Medicare   Date for PT Re-Evaluation 01/12/15   PT Start Time 4818   PT Stop Time 1225   PT Time Calculation (min) 40 min   Activity Tolerance Patient limited by fatigue;Patient limited by pain   Behavior During Therapy [facility] for tasks assessed/performed      Past Medical History  Diagnosis Date  . Depression     treated in the past for years;stopped in 2010 for a years  . Panic attack   . Claustrophobia   . Fibromyalgia   . IBS (irritable bowel syndrome)     Dr. Benson Norway  . Hypercholesteremia     with high HDL  . Glaucoma, narrow-angle     s/p laser surgery  . Duodenal ulcer 1962    h/o  . Hyperthyroidism     h/o x 2 years  . Shingles 1999    h/o  . Hypothyroid 9/08  . GERD (gastroesophageal reflux disease)   . Trochanteric bursitis 12/2008    bilateral  . Superficial thrombophlebitis 03/2009    RLE  . Bell's palsy 1966  . Osteoporosis 10/11    Dr.Hawkes  . SLE (systemic lupus erythematosus)     with arthralgia/myalgia, positive ANA centromere pattern, positive DNA, positive RNP, indeterminant anti-cardiolipin antibody-Dr.Hawkes.  . Carotid artery disease 2010    on vascular screening;unchanged 2013.(could not tolerate simvastatin, no other statins tried)--<30% blockage bilat 07/2011  . Recurrent UTI     has cystocele-Dr.Grewal  . Chronic fatigue and malaise   . Chronic abdominal pain   . 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 Tonsillectomy  1946  .  Vaginal hysterectomy  1971    and bladder repair.  Still has ovaries  . Cataract extraction, bilateral  1995, 1996  . Thyroidectomy, partial  09/2005    L nodule; Dr. Harlow Asa  . Upper gi endoscopy  06/27/12  . Flexible sigmoidoscopy      There were no vitals filed for this visit.  Visit Diagnosis:  Pain of left lower extremity  Pain of right lower extremity      Subjective Assessment - 12/17/14 1156    Subjective I had to miss last visit due to headache. Pelvic floor pain reduced 99.9%. Pressure from prolapse decreased by 80% but patient feels it.    Pertinent History Osteoporosis; irritable bowel   Limitations Sitting   How long can you sit comfortably? 30 min   How long can you stand comfortably? No difficulty   How long can you walk comfortably? no difficulty   Diagnostic tests CAT scan that showed cyst on ovary   Patient Stated Goals reduce pain with activities   Pain Score 4    Pain Location Back  bil. legs   Pain Orientation Right;Left   Pain Descriptors / Indicators Aching;Burning   Pain Type Chronic pain   Pain Onset More than a month ago   Pain Frequency Intermittent   Aggravating Factors  at night when she  sleeps   Multiple Pain Sites No            [facility] PT Assessment - 12/17/14 0001    Strength   Right/Left Hip --  bilateral hip strength is 4/5                     [facility] Adult PT Treatment/Exercise - 12/17/14 0001    Lumbar Exercises: Stretches   Single Knee to Chest Stretch 5 reps   Lumbar Exercises: Aerobic   Stationary Bike level 0 6 min   Lumbar Exercises: Standing   Other Standing Lumbar Exercises trampoline 3 ways holding onto rail   Other Standing Lumbar Exercises big sidestep 8 feet 2 times both ways;walk backwards 8 feet 2 times; March with high step with abdominal contraction 8 feet 2 times    Lumbar Exercises: Supine   Clam 10 reps  bothered knees   Other Supine Lumbar Exercises hookly alternate shoulder flexion with abdominal  contraction   Knee/Hip Exercises: Seated   Long Arc Quad Strengthening;Both;3 sets;10 reps   Long Arc Quad Weight 2 lbs.                  PT Short Term Goals - 12/17/14 1229    PT SHORT TERM GOAL #1   Title pain with daily activities decreased >/= 25%   Time 4   Period Weeks   Status Achieved   PT SHORT TERM GOAL #2   Title pelvic floor strength >/= 3/5 holding for 8 seconds   Time 4   Period Weeks   Status Achieved   PT SHORT TERM GOAL #3   Title be able to go out to entertain with >/ =25% greater ease due to decreased pain   Time 4   Period Weeks   Status Achieved   PT SHORT TERM GOAL #4   Title ability to ambulate with heel toe gait due to increased right knee extension >/=-5 degrees   Time 4   Period Weeks   Status Achieved   PT SHORT TERM GOAL #5   Title understand ways to toilet correctly to decreased feeling of constipation and strain on prolapse   Time 4   Period Weeks   Status Achieved           PT Long Term Goals - 12/17/14 1223    PT LONG TERM GOAL #1   Title pain with daily activities decreased >/= 50%    Time 8   Period Weeks   Status Achieved   PT LONG TERM GOAL #2   Title pelvic floor strength >/= 4/5   Time 8   Period Weeks   Status New   PT LONG TERM GOAL #3   Title move in bed with pain decreased  >/= 50%   Time 8   Period Weeks   Status On-going  40%   PT LONG TERM GOAL #4   Title ambulate with bigger step length due to increased strength and increased hip mobility   Time 8   Period Weeks   Status On-going  needs verbal cues               Plan - 12/17/14 1225    Clinical Impression Statement Patient reports her pelvic pain is 99.9% better.  Patient can move in bed with 40% less pain. Patient has the same hip strength as initial eval but has increased endurance.  Patient reports pelvic pressure has improved by 80%. Patient reports her  exercises are helping her. Patient is able to take bigger steps after therapy.  Patient will continue to require therapy to increase her strength and endurance and decrease pain.     Pt will benefit from skilled therapeutic intervention in order to improve on the following deficits Abnormal gait;Decreased range of motion;Difficulty walking;Impaired flexibility;Postural dysfunction;Decreased endurance;Decreased activity tolerance;Increased fascial restricitons;Pain;Increased muscle spasms;Decreased mobility;Decreased strength   Rehab Potential Good   Clinical Impairments Affecting Rehab Potential None   PT Frequency 2x / week   PT Duration 8 weeks   PT Treatment/Interventions Moist Heat;Therapeutic activities;Patient/family education;Passive range of motion;Therapeutic exercise;Biofeedback;Ultrasound;Gait training;Manual techniques;Neuromuscular re-education;Cryotherapy;Electrical Stimulation;Functional mobility training   PT Next Visit Plan strengthening, soft tissue work if needed   PT Home Exercise Plan current HeP   Consulted and Agree with Plan of Care Patient        Problem List Patient Active Problem List   Diagnosis Date Noted  . Epigastric fullness 10/12/2014  . Early satiety 10/12/2014  . Cystocele 12/30/2013  . IBS (irritable bowel syndrome) 09/30/2013  . GERD (gastroesophageal reflux disease) 06/27/2013  . PVC's (premature ventricular contractions) 10/02/2012  . Depressive disorder, not elsewhere classified 10/02/2012  . H. pylori infection 08/01/2012  . Bradycardia 08/01/2012  . Fatigue 08/01/2012  . Anxiety state, unspecified 05/13/2012  . Osteopenia 05/13/2012  . Unspecified hypothyroidism 05/13/2012    GRAY,CHERYL,PT 12/17/2014, 12:30 PM  [facility] [address], [place], [place], [address] Phone: [phone]   Fax:  [phone]

## 2014-12-22 ENCOUNTER — Telehealth: Payer: Self-pay | Admitting: Gastroenterology

## 2014-12-22 NOTE — Telephone Encounter (Signed)
Patient notified of the results on the biopsy and the recommendations to see ID.  She is notified that she will be contacted directly by ID for an appt

## 2014-12-23 ENCOUNTER — Encounter: Payer: Self-pay | Admitting: Physical Therapy

## 2014-12-23 ENCOUNTER — Ambulatory Visit: Payer: Medicare Other | Attending: Obstetrics and Gynecology | Admitting: Physical Therapy

## 2014-12-23 DIAGNOSIS — N8184 Pelvic muscle wasting: Secondary | ICD-10-CM | POA: Insufficient documentation

## 2014-12-23 DIAGNOSIS — M79604 Pain in right leg: Secondary | ICD-10-CM | POA: Insufficient documentation

## 2014-12-23 DIAGNOSIS — M79605 Pain in left leg: Secondary | ICD-10-CM | POA: Diagnosis not present
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
## 2014-12-24 ENCOUNTER — Telehealth: Payer: Self-pay

## 2014-12-24 NOTE — Telephone Encounter (Signed)
Pt has appt with dr Megan Salon on 01/06/15 2 pm she is aware

## 2014-12-28 ENCOUNTER — Ambulatory Visit (INDEPENDENT_AMBULATORY_CARE_PROVIDER_SITE_OTHER): Payer: Medicare Other | Admitting: Licensed Clinical Social Worker

## 2014-12-28 DIAGNOSIS — F419 Anxiety disorder, unspecified: Secondary | ICD-10-CM | POA: Diagnosis not present

## 2014-12-29 ENCOUNTER — Encounter: Payer: Self-pay | Admitting: Physical Therapy

## 2014-12-29 ENCOUNTER — Ambulatory Visit: Payer: Medicare Other | Admitting: Physical Therapy

## 2014-12-29 DIAGNOSIS — N8184 Pelvic muscle wasting: Secondary | ICD-10-CM | POA: Diagnosis not present

## 2014-12-29 DIAGNOSIS — M79605 Pain in left leg: Secondary | ICD-10-CM | POA: Diagnosis not present

## 2014-12-29 DIAGNOSIS — M79604 Pain in right leg: Secondary | ICD-10-CM | POA: Diagnosis not present

## 2014-12-29 NOTE — Therapy (Signed)
[facility] [address], [place], [place], [address] Phone: [phone]   Fax:  [phone]  Physical Therapy Treatment  Patient Details  Name: Tracy Malone MRN: [id number] Date of Birth: [birth year] Referring Provider:  Dian Queen, MD  Encounter Date: 12/29/2014      PT End of Session - 12/29/14 1229    Visit Number 9   Number of Visits 10  Medicare   Date for PT Re-Evaluation 01/12/15   PT Start Time 7026   PT Stop Time 1230   PT Time Calculation (min) 45 min   Activity Tolerance Patient tolerated treatment well   Behavior During Therapy [facility] for tasks assessed/performed      Past Medical History  Diagnosis Date  . Depression     treated in the past for years;stopped in 2010 for a years  . Panic attack   . Claustrophobia   . Fibromyalgia   . IBS (irritable bowel syndrome)     Dr. Benson Norway  . Hypercholesteremia     with high HDL  . Glaucoma, narrow-angle     s/p laser surgery  . Duodenal ulcer 1962    h/o  . Hyperthyroidism     h/o x 2 years  . Shingles 1999    h/o  . Hypothyroid 9/08  . GERD (gastroesophageal reflux disease)   . Trochanteric bursitis 12/2008    bilateral  . Superficial thrombophlebitis 03/2009    RLE  . Bell's palsy 1966  . Osteoporosis 10/11    Dr.Hawkes  . SLE (systemic lupus erythematosus)     with arthralgia/myalgia, positive ANA centromere pattern, positive DNA, positive RNP, indeterminant anti-cardiolipin antibody-Dr.Hawkes.  . Carotid artery disease 2010    on vascular screening;unchanged 2013.(could not tolerate simvastatin, no other statins tried)--<30% blockage bilat 07/2011  . Recurrent UTI     has cystocele-Dr.Grewal  . Chronic fatigue and malaise   . Chronic abdominal pain   . 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 Tonsillectomy  1946  . Vaginal hysterectomy   1971    and bladder repair.  Still has ovaries  . Cataract extraction, bilateral  1995, 1996  . Thyroidectomy, partial  09/2005    L nodule; Dr. Harlow Asa  . Upper gi endoscopy  06/27/12  . Flexible sigmoidoscopy      There were no vitals filed for this visit.  Visit Diagnosis:  Pain of left lower extremity  Pain of right lower extremity      Subjective Assessment - 12/29/14 1202    Subjective I am feeling stronger.  I have not seen the doctor for the HYPlori bacterial infection. Patient reports her lower abdominal pain is 99% better.    Pertinent History Osteoporosis; irritable bowel   Limitations Sitting   How long can you sit comfortably? uses a certain chair   How long can you stand comfortably? no difficulty    How long can you walk comfortably? tired    Diagnostic tests CAT scan that showed cyst on ovary; biopsy of colon showed a bacterial infection   Patient Stated Goals reduce pain with activities   Currently in Pain? No/denies            [facility] PT Assessment - 12/29/14 0001    AROM   Overall AROM Comments right knee extension 0 degrees   Strength   Right/Left Knee --  flexion  [facility] [address], [place], [place], [address] Phone: [phone]   Fax:  [phone]  Physical Therapy Treatment  Patient Details  Name: Tracy Malone MRN: [id number] Date of Birth: [birth year] Referring Provider:  Dian Queen, MD  Encounter Date: 12/29/2014      PT End of Session - 12/29/14 1229    Visit Number 9   Number of Visits 10  Medicare   Date for PT Re-Evaluation 01/12/15   PT Start Time 7026   PT Stop Time 1230   PT Time Calculation (min) 45 min   Activity Tolerance Patient tolerated treatment well   Behavior During Therapy [facility] for tasks assessed/performed      Past Medical History  Diagnosis Date  . Depression     treated in the past for years;stopped in 2010 for a years  . Panic attack   . Claustrophobia   . Fibromyalgia   . IBS (irritable bowel syndrome)     Dr. Benson Norway  . Hypercholesteremia     with high HDL  . Glaucoma, narrow-angle     s/p laser surgery  . Duodenal ulcer 1962    h/o  . Hyperthyroidism     h/o x 2 years  . Shingles 1999    h/o  . Hypothyroid 9/08  . GERD (gastroesophageal reflux disease)   . Trochanteric bursitis 12/2008    bilateral  . Superficial thrombophlebitis 03/2009    RLE  . Bell's palsy 1966  . Osteoporosis 10/11    Dr.Hawkes  . SLE (systemic lupus erythematosus)     with arthralgia/myalgia, positive ANA centromere pattern, positive DNA, positive RNP, indeterminant anti-cardiolipin antibody-Dr.Hawkes.  . Carotid artery disease 2010    on vascular screening;unchanged 2013.(could not tolerate simvastatin, no other statins tried)--<30% blockage bilat 07/2011  . Recurrent UTI     has cystocele-Dr.Grewal  . Chronic fatigue and malaise   . Chronic abdominal pain   . 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 Tonsillectomy  1946  . Vaginal hysterectomy   1971    and bladder repair.  Still has ovaries  . Cataract extraction, bilateral  1995, 1996  . Thyroidectomy, partial  09/2005    L nodule; Dr. Harlow Asa  . Upper gi endoscopy  06/27/12  . Flexible sigmoidoscopy      There were no vitals filed for this visit.  Visit Diagnosis:  Pain of left lower extremity  Pain of right lower extremity      Subjective Assessment - 12/29/14 1202    Subjective I am feeling stronger.  I have not seen the doctor for the HYPlori bacterial infection. Patient reports her lower abdominal pain is 99% better.    Pertinent History Osteoporosis; irritable bowel   Limitations Sitting   How long can you sit comfortably? uses a certain chair   How long can you stand comfortably? no difficulty    How long can you walk comfortably? tired    Diagnostic tests CAT scan that showed cyst on ovary; biopsy of colon showed a bacterial infection   Patient Stated Goals reduce pain with activities   Currently in Pain? No/denies            [facility] PT Assessment - 12/29/14 0001    AROM   Overall AROM Comments right knee extension 0 degrees   Strength   Right/Left Knee --  flexion  [facility] [address] [place], [place], [address] Phone: [phone]   Fax:  [phone]  Physical Therapy Treatment  Patient Details  Name: Genene L Orgeron MRN: [id number] Date of Birth: 07/23/1932 Referring Provider:  Grewal, Michelle, MD  Encounter Date: 12/29/2014      PT End of Session - 12/29/14 1229    Visit Number 9   Number of Visits 10  Medicare   Date for PT Re-Evaluation 01/12/15   PT Start Time 1145   PT Stop Time 1230   PT Time Calculation (min) 45 min   Activity Tolerance Patient tolerated treatment well   Behavior During Therapy WFL for tasks assessed/performed      Past Medical History  Diagnosis Date  . Depression     treated in the past for years;stopped in 2010 for a years  . Panic attack   . Claustrophobia   . Fibromyalgia   . IBS (irritable bowel syndrome)     Dr. Hung  . Hypercholesteremia     with high HDL  . Glaucoma, narrow-angle     s/p laser surgery  . Duodenal ulcer 1962    h/o  . Hyperthyroidism     h/o x 2 years  . Shingles 1999    h/o  . Hypothyroid 9/08  . GERD (gastroesophageal reflux disease)   . Trochanteric bursitis 12/2008    bilateral  . Superficial thrombophlebitis 03/2009    RLE  . Bell's palsy 1966  . Osteoporosis 10/11    Dr.Hawkes  . SLE (systemic lupus erythematosus)     with arthralgia/myalgia, positive ANA centromere pattern, positive DNA, positive RNP, indeterminant anti-cardiolipin antibody-Dr.Hawkes.  . Carotid artery disease 2010    on vascular screening;unchanged 2013.(could not tolerate simvastatin, no other statins tried)--<30% blockage bilat 07/2011  . Recurrent UTI     has cystocele-Dr.Grewal  . Chronic fatigue and malaise   . Chronic abdominal pain   . 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 Tonsillectomy  1946  . Vaginal hysterectomy   1971    and bladder repair.  Still has ovaries  . Cataract extraction, bilateral  1995, 1996  . Thyroidectomy, partial  09/2005    L nodule; Dr. Gerkin  . Upper gi endoscopy  06/27/12  . Flexible sigmoidoscopy      There were no vitals filed for this visit.  Visit Diagnosis:  Pain of left lower extremity  Pain of right lower extremity      Subjective Assessment - 12/29/14 1202    Subjective I am feeling stronger.  I have not seen the doctor for the HYPlori bacterial infection. Patient reports her lower abdominal pain is 99% better.    Pertinent History Osteoporosis; irritable bowel   Limitations Sitting   How long can you sit comfortably? uses a certain chair   How long can you stand comfortably? no difficulty    How long can you walk comfortably? tired    Diagnostic tests CAT scan that showed cyst on ovary; biopsy of colon showed a bacterial infection   Patient Stated Goals reduce pain with activities   Currently in Pain? No/denies            [facility] PT Assessment - 12/29/14 0001    AROM   Overall AROM Comments right knee extension 0 degrees   Strength   Right/Left Knee --  flexion

## 2014-12-29 NOTE — Patient Instructions (Addendum)
KNEE: Extension, Long Arc Quad (Band)   Place band around leg and under other foot. Pull band forward until knee is straight. Hold _1__ seconds. Use __red______ band. __10_ reps per set, _3__ sets per day Copyright  VHI. All rights reserved.   FLEXION: Sitting - Resistance Band (Active)  Place knot of band into door.  Sit with right leg extended. Against yellow resistance band, bend knee and draw foot backward. Complete _3__ sets of _10__ repetitions. Perform _1__ sessions per day.  http://gtsc.exer.us/231   Copyright  VHI. All rights reserved.   ANKLE: Plantarflexion - Sitting (Band)   Place band around foot; hold other end. Sit at edge of sitting surface. Keep heel in place. Push foot down against band. Hold _1__ seconds. Use ___red_____ band. _30__ reps per set, _1__ sets per day  Copyright  VHI. All rights reserved.   [facility] [address], [place], [place] [address] Phone # [phone] Fax [phone]

## 2014-12-31 ENCOUNTER — Ambulatory Visit (INDEPENDENT_AMBULATORY_CARE_PROVIDER_SITE_OTHER): Payer: Medicare Other | Admitting: Internal Medicine

## 2014-12-31 ENCOUNTER — Telehealth: Payer: Self-pay | Admitting: *Deleted

## 2014-12-31 ENCOUNTER — Encounter: Payer: Self-pay | Admitting: Physical Therapy

## 2014-12-31 VITALS — BP 116/70 | HR 120 | Temp 97.9°F | Wt 110.0 lb

## 2014-12-31 DIAGNOSIS — B9681 Helicobacter pylori [H. pylori] as the cause of diseases classified elsewhere: Secondary | ICD-10-CM | POA: Diagnosis not present

## 2014-12-31 DIAGNOSIS — A048 Other specified bacterial intestinal infections: Secondary | ICD-10-CM

## 2014-12-31 MED ORDER — METRONIDAZOLE 500 MG PO TABS: 500.0000 mg | ORAL_TABLET | Freq: Two times a day (BID) | ORAL | Status: DC

## 2014-12-31 MED ORDER — OMEPRAZOLE 20 MG PO CPDR: 20.0000 mg | DELAYED_RELEASE_CAPSULE | Freq: Two times a day (BID) | ORAL | Status: DC

## 2014-12-31 MED ORDER — ONDANSETRON HCL 4 MG PO TABS: 4.0000 mg | ORAL_TABLET | Freq: Two times a day (BID) | ORAL | Status: DC

## 2014-12-31 MED ORDER — LEVOFLOXACIN 250 MG PO TABS: 250.0000 mg | ORAL_TABLET | Freq: Two times a day (BID) | ORAL | Status: DC

## 2014-12-31 MED ORDER — CLARITHROMYCIN 500 MG PO TABS: 500.0000 mg | ORAL_TABLET | Freq: Two times a day (BID) | ORAL | Status: DC

## 2014-12-31 MED ORDER — METRONIDAZOLE 500 MG PO TABS
500.0000 mg | ORAL_TABLET | Freq: Two times a day (BID) | ORAL | Status: DC
Start: 2014-12-31 — End: 2014-12-31

## 2014-12-31 NOTE — Telephone Encounter (Signed)
question about ABX change and sequencing rxes each morning.  RN referred to Dr. Storm Frisk office note and went through the medications and morning sequencing of taking them.  Pt verbalized back these information.

## 2014-12-31 NOTE — Progress Notes (Signed)
Subjective:    Patient ID: Tracy Malone, female    DOB: 1931-11-14, 81 y.o.   MRN: [id number]  HPI 81yo F with IBS, fibromyalgia, GERD, dyspepsia who sees Dr. Benson Norway and saw Dr. Ardis Hughs for second opinion. Recent path looks like she has still ongoing h.pylori infection. It appears that she was treated in the past, but did not tolerate pylera pack due to diarrhea. She reports that she only took 1-2 doses before stopping the medicaiton. Difficult to tell which component may her sick. Allergies  Allergen Reactions  . Salmon [Fish Allergy] Hives and Shortness Of Breath  . Shellfish Allergy Anaphylaxis  . Aspirin Other (See Comments)    Sever stomach pain due to ulcer scaring.  . Ciprofloxacin Diarrhea  . Codeine Nausea And Vomiting  . Darvocet [Propoxyphene N-Acetaminophen] Nausea And Vomiting  . Demerol [Meperidine] Nausea And Vomiting    Violent vomitting  . Diphedryl [Diphenhydramine] Other (See Comments)    Increased pulse/small amount ok  . Doxycycline Hyclate Other (See Comments)    GI intolerance.  Marland Kitchen Epinephrine Other (See Comments)    Breathing problems  . Erythromycin Other (See Comments)    GI intolerance.  Yvette Rack [Cyclobenzaprine] Other (See Comments)    Tingly/prickly sensation.  . Iodine   . Keflex [Cephalexin] Hives  . Latex Other (See Comments)    Gloves ok  . Prednisone Other (See Comments)    Headache  . Pylera [Bis Subcit-Metronid-Tetracyc] Swelling    Tongue swelling. Face tingling  . Sulfa Antibiotics Other (See Comments)    Increased pulse, fainting, diarrhea, thrush  . Xylocaine [Lidocaine Hcl]     With epinephrine, given by dentist.  Speeded up heart rate and she passed out (occured twice, at dentist)  . Zoloft [Sertraline Hcl] Swelling and Other (See Comments)    Migraine Swelling of tongue/lip (09/2012)  . Advil [Ibuprofen] Rash    Motrin ok with a GI effect.  . Penicillins Hives and Rash    Current Outpatient Prescriptions on File Prior to  Visit  Medication Sig Dispense Refill  . acetaminophen (TYLENOL) 650 MG CR tablet Take 650 mg by mouth as needed for pain.    Marland Kitchen ALPRAZolam (XANAX) 0.5 MG tablet Take 0.25 mg by mouth 2 (two) times daily as needed.     . ARTIFICIAL TEAR OP Apply 1-2 drops to eye 4 (four) times daily as needed (dry eyes).     Marland Kitchen b complex vitamins capsule Take 1 capsule by mouth daily.     . Biotin 1000 MCG tablet Take 1,000 mcg by mouth daily.    . cholecalciferol (VITAMIN D) 1000 UNITS tablet Take 1,000 Units by mouth 2 (two) times daily.     . Loperamide HCl (IMODIUM PO) Take 1 tablet by mouth as needed.    . Magnesium 250 MG TABS Take 1 tablet by mouth daily.    . polyethylene glycol (MIRALAX / GLYCOLAX) packet Take 17 g by mouth as needed.     Marland Kitchen SYNTHROID 50 MCG tablet Take 0.5-1 tablets (25-50 mcg total) by mouth daily before breakfast. Take half tab M, W, F, full tab T, Th ,Sat and Sun 90 tablet 1  . temazepam (RESTORIL) 15 MG capsule Take 15 mg by mouth at bedtime as needed for sleep.    . vitamin E 400 UNIT capsule Take 400 Units by mouth every Monday, Wednesday, and Friday. 3 times weekly     No current facility-administered medications on file prior to visit.  Active Ambulatory Problems    Diagnosis Date Noted  . Anxiety state, unspecified 05/13/2012  . Osteopenia 05/13/2012  . Unspecified hypothyroidism 05/13/2012  . H. pylori infection 08/01/2012  . Bradycardia 08/01/2012  . Fatigue 08/01/2012  . PVC's (premature ventricular contractions) 10/02/2012  . Depressive disorder, not elsewhere classified 10/02/2012  . GERD (gastroesophageal reflux disease) 06/27/2013  . IBS (irritable bowel syndrome) 09/30/2013  . Cystocele 12/30/2013  . Epigastric fullness 10/12/2014  . Early satiety 10/12/2014   Resolved Ambulatory Problems    Diagnosis Date Noted  . Epigastric abdominal pain 05/13/2012   Past Medical History  Diagnosis Date  . Depression   . Panic attack   . Claustrophobia   .  Fibromyalgia   . Hypercholesteremia   . Glaucoma, narrow-angle   . Duodenal ulcer 1962  . Hyperthyroidism   . Shingles 1999  . Hypothyroid 9/08  . Trochanteric bursitis 12/2008  . Superficial thrombophlebitis 03/2009  . Bell's palsy 1966  . Osteoporosis 10/11  . SLE (systemic lupus erythematosus)   . Carotid artery disease 2010  . Recurrent UTI   . Chronic fatigue and malaise   . Chronic abdominal pain   . 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 Marital Status: Married    Spouse Name: N/A  . Number of Children: 2  . Years of Education: N/A   Occupational History  . retired (school system)    Social History Main Topics  . Smoking status: Never Smoker   . Smokeless tobacco: Never Used  . Alcohol Use: No  . Drug Use: No  . Sexual Activity: Not on file   Other Topics Concern  . Not on file   Social History Narrative   Married.  Son lives in [place]; Daughter [name] lives in [place]; 2 grandchildren  family history includes Cancer in her brother; Diabetes in her maternal grandfather; Heart disease in her brother and mother; Hypertension in her mother and sister. Review of Systems Review of Systems  Constitutional: Negative for fever, chills, diaphoresis, activity change, appetite change, fatigue and unexpected weight change.  HENT: Negative for congestion, sore throat, rhinorrhea, sneezing, trouble swallowing and sinus pressure.  Eyes: Negative for photophobia and visual disturbance.  Respiratory: Negative for cough, chest tightness, shortness of breath, wheezing and stridor.  Cardiovascular: Negative for chest pain, palpitations and leg swelling.  Gastrointestinal: Negative for nausea, vomiting, abdominal pain, diarrhea, constipation, blood in stool, abdominal distention and anal bleeding.  Genitourinary: Negative for dysuria, hematuria, flank pain and difficulty urinating.  Musculoskeletal: Negative for myalgias, back pain, joint swelling, arthralgias and gait  problem.  Skin: Negative for color change, pallor, rash and wound.  Neurological: Negative for dizziness, tremors, weakness and light-headedness.  Hematological: Negative for adenopathy. Does not bruise/bleed easily.  Psychiatric/Behavioral: Negative for behavioral problems, confusion, sleep disturbance, dysphoric mood, decreased concentration and agitation.       Objective:   Physical Exam BP 116/70 mmHg  Pulse 120  Temp(Src) 97.9 F (36.6 C)  Wt 110 lb (49.896 kg)  Constitutional:  oriented to person, place, and time. appears well-developed and well-nourished. No distress.  HENT: [place]/AT, PERRLA, no scleral icterus Mouth/Throat: Oropharynx is clear and moist. No oropharyngeal exudate.  Cardiovascular: Normal rate, regular rhythm and normal heart sounds. Exam reveals no gallop and no friction rub.  No murmur heard.  Pulmonary/Chest: Effort normal and breath sounds normal. No respiratory distress.  has no wheezes.  Neck = supple, no nuchal rigidity Abdominal: Soft. Bowel sounds are normal.  exhibits no distension. There is no tenderness.  Lymphadenopathy: no cervical adenopathy. No axillary adenopathy Neurological: alert and oriented to person, place, and time.  Skin: Skin is warm and dry. No rash noted. No erythema.  Psychiatric: a normal mood and affect.  behavior is normal.   Labs/path: 5/18 PATH: Surgical [P], gastric - CHRONIC MILDLY ACTIVE GASTRITIS WITH HELICOBACTER PYLORI. - WARTHIN-STARRY STAIN POSITIVE FOR HELICOBACTER PYLORI. - NO DYSPLASIA OR MALIGNANCY. Claudette Laws MD Pathologist, Electronic [employer]     Assessment & Plan:  It appears that she only took one dose of pylera pack nad stopped, it is unclear if her allergy is to tetracycline. We will first try the following regimen:  10 day course: omeprazole 20mg  BID, metronidazole 500mg  BID(which she tolerates), clarithromycin 500mg  BID.   We will premedicate with zofran 4mg  BID ( esp for GI side effects of  clarithromycin), take meds after meals  Also ask her take daily immodium. As well as her Aline probiotic.  If she is unable to do this, will need to see if she can get repeat egd to get cultures for h.pylori drug resistant testing.  Only other alternative would be: again needs premeds for nausea nad antimotility for her ibs   Moxifloxacin, rifabutin, ppi, and probiotic

## 2014-12-31 NOTE — Progress Notes (Signed)
Subjective:    Patient ID: JASLYNE BEECK, female    DOB: 1932/06/05, 81 y.o.   MRN: [id number]  HPI  Mrs. Vialpando is an 81 y/o woman with a hx of duodenal ulcer, IBS, H. Pylori infection, colitis, fibromyalgia, lupus. She was referred by Dr. Ardis Hughs for H. Pylori treatment and multiple allergies and intolerance of the Pylopac. She states that she developed tingling to her face and her physician told her to stop taking this. She states she has a continuous "gnawing ache" in her stomach. After she eats she states her stomach feels swollen. She has been afraid to eat although she does feel hungry. She denies any belching, flatulence, bloating. She states that she has tolerated Flagyl, pepto-bismol without problems. She states nexium gives her severe diarrhea but she does tolerate Omeprazole with a little diarrhea. She states that with her IBS, diarrhea is a big concern for her. She does take Immodium on occasion and is concerned that treatment for H. Pylori will cause her a lot of GI distress.   Outpatient Encounter Prescriptions as of 12/31/2014  Medication Sig Note  . ALPRAZolam (XANAX) 0.5 MG tablet Take 0.25 mg by mouth 2 (two) times daily as needed.  12/04/2014: Takes 1/2 tablet twice daily as needed  . b complex vitamins capsule Take 1 capsule by mouth daily.    . cholecalciferol (VITAMIN D) 1000 UNITS tablet Take 1,000 Units by mouth 2 (two) times daily.    . Magnesium 250 MG TABS Take 1 tablet by mouth daily.   Marland Kitchen SYNTHROID 50 MCG tablet Take 0.5-1 tablets (25-50 mcg total) by mouth daily before breakfast. Take half tab M, W, F, full tab T, Th ,Sat and Sun   . temazepam (RESTORIL) 15 MG capsule Take 15 mg by mouth at bedtime as needed for sleep.   . vitamin E 400 UNIT capsule Take 400 Units by mouth every Monday, Wednesday, and Friday. 3 times weekly   . acetaminophen (TYLENOL) 650 MG CR tablet Take 650 mg by mouth as needed for pain.   Marland Kitchen ARTIFICIAL TEAR OP Apply 1-2 drops to eye 4 (four) times  daily as needed (dry eyes).    . Biotin 1000 MCG tablet Take 1,000 mcg by mouth daily.   . clarithromycin (BIAXIN) 500 MG tablet Take 1 tablet (500 mg total) by mouth 2 (two) times daily.   Marland Kitchen levofloxacin (LEVAQUIN) 250 MG tablet Take 1 tablet (250 mg total) by mouth 2 (two) times daily.   . Loperamide HCl (IMODIUM PO) Take 1 tablet by mouth as needed.   . metroNIDAZOLE (FLAGYL) 500 MG tablet Take 1 tablet (500 mg total) by mouth 2 (two) times daily.   Marland Kitchen omeprazole (PRILOSEC) 20 MG capsule Take 1 capsule (20 mg total) by mouth 2 (two) times daily before a meal.   . ondansetron (ZOFRAN) 4 MG tablet Take 1 tablet (4 mg total) by mouth 2 (two) times daily.   . polyethylene glycol (MIRALAX / GLYCOLAX) packet Take 17 g by mouth as needed.  12/04/2014: As needed   No facility-administered encounter medications on file as of 12/31/2014.    Review of Systems  Constitutional: Negative for fever and fatigue.  HENT: Negative for trouble swallowing.   Gastrointestinal: Positive for abdominal pain and diarrhea. Negative for nausea, constipation, blood in stool and abdominal distention.       IBS-D with approx. 3 episodes of diarrhea/day.  Genitourinary: Negative for difficulty urinating.  Musculoskeletal: Negative for arthralgias.  Skin: Negative for  rash.  Neurological: Positive for weakness.  Hematological: Negative for adenopathy.  Psychiatric/Behavioral: The patient is nervous/anxious.        Objective:   Physical Exam  Constitutional: She is oriented to person, place, and time. She appears well-developed and well-nourished.  HENT:  Head: Normocephalic and atraumatic.  Eyes: Conjunctivae are normal. Pupils are equal, round, and reactive to light.  Neck: Normal range of motion. Neck supple.  Cardiovascular: Normal rate and normal heart sounds.   Pulmonary/Chest: Effort normal and breath sounds normal.  Abdominal: Soft. Bowel sounds are normal. She exhibits no distension. There is tenderness.    Tenderness to LUQ with deep palpation  Musculoskeletal: She exhibits no tenderness.  Neurological: She is alert and oriented to person, place, and time.  Skin: Skin is warm and dry.  Psychiatric: She has a normal mood and affect.          Assessment & Plan:  Assessment: Mrs. Lutze is anxious about starting the H. Pylori regimen due to her past intolerances of medications. We have discussed pretreating her for symptoms of nausea and diarrhea and counseled her on the importance of adhering to therapy for the full course of antibiotics. We discussed signs of allergies to medications versus medication side effects and worked with her to develop a plan regarding her regimen.  Plan: H. Pylori infection: Will start 10 day course levofloxacin 250mg  BID, metronidazole 500mg  BID(which she tolerates), clarithromycin 500mg  BID, plus her prilosec twice a day  We will premedicate with zofran 4mg  BID 40min prior to her regimen, Immodium daily and Align probiotics She has been directed to take medications after meals.  Discussed porribly needing repeat egd to get cultures for h.pylori drug resistant testing if she is unable to finish the course Only other alternative would be: again needs premeds for nausea nad antimotility for her ibs  Moxifloxacin, rifabutin, ppi, and probiotic

## 2015-01-05 ENCOUNTER — Ambulatory Visit: Payer: Medicare Other | Admitting: Physical Therapy

## 2015-01-06 ENCOUNTER — Ambulatory Visit: Payer: Self-pay | Admitting: Internal Medicine

## 2015-01-07 ENCOUNTER — Ambulatory Visit: Payer: Medicare Other | Admitting: Physical Therapy

## 2015-01-07 ENCOUNTER — Encounter: Payer: Self-pay | Admitting: Physical Therapy

## 2015-01-07 DIAGNOSIS — M6289 Other specified disorders of muscle: Secondary | ICD-10-CM

## 2015-01-07 DIAGNOSIS — M79605 Pain in left leg: Secondary | ICD-10-CM

## 2015-01-07 DIAGNOSIS — M79604 Pain in right leg: Secondary | ICD-10-CM

## 2015-01-07 DIAGNOSIS — N8184 Pelvic muscle wasting: Secondary | ICD-10-CM | POA: Diagnosis not present

## 2015-01-07 NOTE — Therapy (Signed)
[facility] [address] [place], [place], [address] Phone: [phone]   Fax:  [phone]  Physical Therapy Treatment  Patient Details  Name: Tracy Malone MRN: [id number] Date of Birth: [birth year] Referring Provider:  Dian Queen, MD  Encounter Date: 01/07/2015      PT End of Session - 01/07/15 1219    Visit Number 10   Date for PT Re-Evaluation 01/12/15   PT Start Time 9767   PT Stop Time 1220   PT Time Calculation (min) 35 min   Activity Tolerance Patient limited by lethargy;Other (comment)  feeling bad due to taking antibiotics   Behavior During Therapy [facility] for tasks assessed/performed      Past Medical History  Diagnosis Date  . Depression     treated in the past for years;stopped in 2010 for a years  . Panic attack   . Claustrophobia   . Fibromyalgia   . IBS (irritable bowel syndrome)     Dr. Benson Norway  . Hypercholesteremia     with high HDL  . Glaucoma, narrow-angle     s/p laser surgery  . Duodenal ulcer 1962    h/o  . Hyperthyroidism     h/o x 2 years  . Shingles 1999    h/o  . Hypothyroid 9/08  . GERD (gastroesophageal reflux disease)   . Trochanteric bursitis 12/2008    bilateral  . Superficial thrombophlebitis 03/2009    RLE  . Bell's palsy 1966  . Osteoporosis 10/11    Dr.Hawkes  . SLE (systemic lupus erythematosus)     with arthralgia/myalgia, positive ANA centromere pattern, positive DNA, positive RNP, indeterminant anti-cardiolipin antibody-Dr.Hawkes.  . Carotid artery disease 2010    on vascular screening;unchanged 2013.(could not tolerate simvastatin, no other statins tried)--<30% blockage bilat 07/2011  . Recurrent UTI     has cystocele-Dr.Grewal  . Chronic fatigue and malaise   . Chronic abdominal pain   . Frequent PVCs 07/2012    Seen by [facility]: benign, asymptomatic, normal EF    Past Surgical History  Procedure Laterality Date  . Tonsillectomy  1946  .  Vaginal hysterectomy  1971    and bladder repair.  Still has ovaries  . Cataract extraction, bilateral  1995, 1996  . Thyroidectomy, partial  09/2005    L nodule; Dr. Harlow Asa  . Upper gi endoscopy  06/27/12  . Flexible sigmoidoscopy      There were no vitals filed for this visit.  Visit Diagnosis:  PFD (pelvic floor dysfunction)  Pain of right lower extremity  Pain of left lower extremity      Subjective Assessment - 01/07/15 1159    Subjective I am on medication for my bacterial infection and feel bad.  I am tired, nausea and my stomach does not feel well. Patient reports vaginal achiness has improved by 98%.  Patient feels better in the vaginal area.    Pertinent History Osteoporosis; irritable bowel   Diagnostic tests CAT scan that showed cyst on ovary; biopsy of colon showed a bacterial infection   Patient Stated Goals reduce pain with activities   Currently in Pain? No/denies   Multiple Pain Sites No            [facility] PT Assessment - 01/07/15 0001    Assessment   Medical Diagnosis Vaginitis, pelvic floor   Onset Date/Surgical Date 08/24/14   Prior Therapy None   Prior Function   Level of Independence Independent with basic ADLs  Observation/Other Assessments   Focus on Therapeutic Outcomes (FOTO)  54% limitation due to feeling bad from bacterial infection   AROM   Overall AROM Comments right knee extension 0 degrees   Strength   Right/Left Hip --  bilateral hip strength is 4/5   Ambulation/Gait   Gait Pattern --  Patient ambulates with decreased right knee extension   Gait Comments amulates with improved heel strike bil. and step length     Physical therapist reviewed with patient her HEP and she verbally understands how to perform them.  Patient was not up to demonstrating due to not feeling well. Patient was educated on osteoporosis, ways to protect her spine, ways to prevent falls, and ways to incorporate calcium in her diet to improve bone mass. Patient  verbally understands.                         PT Education - 01/07/15 1219    Education provided Yes   Education Details education on osteoporosis and reviewed her HEP   Person(s) Educated Patient   Methods Explanation;Handout;Verbal cues   Comprehension Verbalized understanding          PT Short Term Goals - 12/17/14 1229    PT SHORT TERM GOAL #1   Title pain with daily activities decreased >/= 25%   Time 4   Period Weeks   Status Achieved   PT SHORT TERM GOAL #2   Title pelvic floor strength >/= 3/5 holding for 8 seconds   Time 4   Period Weeks   Status Achieved   PT SHORT TERM GOAL #3   Title be able to go out to entertain with >/ =25% greater ease due to decreased pain   Time 4   Period Weeks   Status Achieved   PT SHORT TERM GOAL #4   Title ability to ambulate with heel toe gait due to increased right knee extension >/=-5 degrees   Time 4   Period Weeks   Status Achieved   PT SHORT TERM GOAL #5   Title understand ways to toilet correctly to decreased feeling of constipation and strain on prolapse   Time 4   Period Weeks   Status Achieved           PT Long Term Goals - 01/07/15 1207    PT LONG TERM GOAL #1   Title pain with daily activities decreased >/= 50%    Time 8   Period Weeks   Status Achieved   PT LONG TERM GOAL #2   Title pelvic floor strength >/= 4/5   Time 8   Period Weeks   Status Deferred  not tested due to patient feeling bad from new antibiotic.    PT LONG TERM GOAL #3   Title move in bed with pain decreased  >/= 50%   Time 8   Period Weeks   Status Achieved   PT LONG TERM GOAL #4   Title ambulate with bigger step length due to increased strength and increased hip mobility   Time 8   Period Weeks   Status Achieved               Plan - 01/07/15 1220    Clinical Impression Statement Patient is a 81 year old female who attended therapy for pain in LE and pelvic.  Patient has met all of her goal with the  execption of pelvic floor strength due to her not feeling well  from taking an antibiotic for a bacterial infection and feeling weak. Patient has 4/5 strength for bilteral knees and hips due to feeling weak from taking antibiotic.  Patient reports her abdominal pain is 95% better and she is feeling stronger in the pelvic area. Patient is able to to ambulate with bigger step length, improved heel strike, but still has slight decreased righ tknee extension. FOTO score is 54% limitation but should be around 10 % limitation.  Patient is limited due to her feeling  tired, weak and fatiqued from taking a new antibiotic for a bacterial infection.    Pt will benefit from skilled therapeutic intervention in order to improve on the following deficits Abnormal gait;Decreased range of motion;Difficulty walking;Impaired flexibility;Postural dysfunction;Decreased endurance;Decreased activity tolerance;Increased fascial restricitons;Pain;Increased muscle spasms;Decreased mobility;Decreased strength   Rehab Potential Good   Clinical Impairments Affecting Rehab Potential None   PT Treatment/Interventions Moist Heat;Therapeutic activities;Patient/family education;Passive range of motion;Therapeutic exercise;Biofeedback;Ultrasound;Gait training;Manual techniques;Neuromuscular re-education;Cryotherapy;Electrical Stimulation;Functional mobility training   PT Next Visit Plan Discharge    PT Home Exercise Plan current HeP   Consulted and Agree with Plan of Care Patient          G-Codes - Jan 21, 2015 1226    Functional Assessment Tool Used FOTO score is 54% limitation but patient should be 20% limited due to her presently taking an antibiotic for a bacterial infection and feeling weak, tired, and fatiqued   Functional Limitation Other PT subsequent   Other PT Secondary Current Status (J6116) At least 40 percent but less than 60 percent impaired, limited or restricted   Other PT Secondary Goal Status (I3539) At least 20 percent  but less than 40 percent impaired, limited or restricted      Problem List Patient Active Problem List   Diagnosis Date Noted  . Epigastric fullness 10/12/2014  . Early satiety 10/12/2014  . Cystocele 12/30/2013  . IBS (irritable bowel syndrome) 09/30/2013  . GERD (gastroesophageal reflux disease) 06/27/2013  . PVC's (premature ventricular contractions) 10/02/2012  . Depressive disorder, not elsewhere classified 10/02/2012  . H. pylori infection 08/01/2012  . Bradycardia 08/01/2012  . Fatigue 08/01/2012  . Anxiety state, unspecified 05/13/2012  . Osteopenia 05/13/2012  . Unspecified hypothyroidism 05/13/2012    GRAY,CHERYL,PT 21-Jan-2015, 12:32 PM  [facility] [address], [place], [place], [address] Phone: [phone]   Fax:  [phone]   PHYSICAL THERAPY DISCHARGE SUMMARY  Visits from Start of Care: 10  Current functional level related to goals / functional outcomes:  See above for goal updates.   Pelvic floor strength was not assessed due to patient not wanting to be assessed the area.  Remaining deficits:  None.   Education / Equipment: HEP Plan: Patient agrees to discharge.  Patient goals were partially met. Patient is being discharged due to meeting the stated rehab goals. Thank you for the referral. Earlie Counts, PT 01-21-15 12:32 PM  ?????

## 2015-01-07 NOTE — Patient Instructions (Signed)
DO's and DON'T's   Avoid and/or Minimize positions of forward bending ( flexion)  Side bending and rotation of the trunk  Especially when movements occur together   When your back aches:   Don't sit down   Lie down on your back with a small pillow under your head and one under your knees or as outlined by our therapist. Or, lie in the 90/90 position ( on the floor with your feet and legs on the sofa with knees and hips bent to 90 degrees)  Tying or putting on your shoes:   Don't bend over to tie your shoes or put on socks.  Instead, bring one foot up, cross it over the opposite knee and bend forward (hinge) at the hips to so the task.  Keep your back straight.  If you cannot do this safely, then you need to use long handled assistive devices such as a shoehorn and sock puller.  Exercising:  Don't engage in ballistic types of exercise routines such as high-impact aerobics or jumping rope  Don't do exercises in the gym that bring you forward (abdominal crunches, sit-ups, touching your  toes, knee-to-chest, straight leg raising.)  Follow a regular exercise program that includes a variety of different weight-bearing activities, such as low-impact aerobics, T' ai chi or walking as your physical therapist advises  Do exercises that emphasize return to normal body alignment and strengthening of the muscles that keep your back straight, as outlined in this program or by your therapist  Household tasks:  Don't reach unnecessarily or twist your trunk when mopping, sweeping, vacuuming, raking, making beds, weeding gardens, getting objects ou of cupboards, etc.  Keep your broom, mop, vacuum, or rake close to you and mover your whole body as you move them. Walk over to the area on which you are working. Arrange kitchen, bathroom, and bedroom shelves so that frequently used items may be reached without excessive bending, twisting, and reaching.  Use a  sturdy stool if necessary.  Don't bend from the waist to pick up something up  Off the floor, out of the trunk of your car, or to brush your teeth, wash your face, etc.   Bend at the knees, keeping back straight as possible. Use a reacher if necessary.   Prevention of fracture is the so-called "BOTTOm -Line" in the management of OSTEOPOROSIS. Do not take unnecessary chances in movement. Once a compression fracture occurs, the process is very difficult to control; one fracture is frequently followed by many more.     What is Osteoporosis?  - A silent disease in which the skeleton is weakened by decreased bone density. - Characterized by low bone mass, deterioration of bone, and increased risk of fracture postmenopausal (primary) or the result of an identifiable condition/event (secondary) - Commonly found in the wrists, spine, and hips; these are high-risk stress areas and very susceptible to fractures.  The Facts: - There are 1.5 million fractures/year o 500,000 spine; 250,000 hip with over 60,000 nursing home admissions secondary to hip fracture; and 200,000 wrist - After hip fracture, only 50% of people able to walk independently prior to the fracture return to independent ambulation. - Bone mass: Peaks at age 43-30, and begins declining at age 22-50.   Osteoporosis is defined by the [facility]) as:  NOF/WHO Criteria for Interpreting Results of Bone Density Assessment  Results Diagnosis  Within 1 standard deviation (SD) of young adult mean Normal  Between 1 and -2.5 SD below mean,  repeat in 2 years Low bone mass (osteopenia)  Greater than -2.5 SD below mean Osteoporosis  Greater than -2.5 SD below mean and one or more fragility fractures exist Severe Osteoporosis  *Results can be affected by positioning of the body in the DEXA scan, presence of current or old fractures, arthritis, extraneous calcifications.    Osteoporosis is not just a women's  disease!  - 30-40% of women will develop osteoporosis - 5-15% of males will develop osteoporosis   What are the risk factors?  1. Female 2. Thin, small frame 3. Caucasian, Asian race 4. Early menopause (<22 years old)/amenorrhea/delayed puberty 26. Old age 82. Family history (fractures, stooped posture)\ 7. Low calcium diet 8. Sedentary lifestyle 9. Alcohol, Caffeine, Smoking 10. Malnutrition, GI Disease 11. Prolonged use of Glucocorticoids (Prednisone), Meds to treat asthma, arthritis, cancers, thyroid, and anti-seizure meds.  How do I know for sure?  Get a BONE DENSITY TEST!  This measures bone loss and it's painless, non-invasive, and only takes 5-10 minutes!  What can I do about it?  ? Decrease your risk factors (alcohol, caffeine, smoking) ? Helpful medications (see next page) ? Adequate Calcium and Vitamin D intake ? Get active! o Proper posture - Sit and stand tall! No slouching or twisting o Weight-Bearing Exercise - walking, stair climbing, elliptical; NO jogging or high-impact exercise. o Resistive Exercise - Cybex weight equipment, Nautilus, dumbbells, therabands  **Be sure to maintain proper alignment when lifting any weight!!  **When using equipment, avoid abdominal exercises which involve "crunching" or curling or twisting the trunk, biceps machines, cross-country machines, moving handlebars, or ANY MACHINE WITH ROTATION OR FORWARD BENDING!!!           Approved Pharmacologic Management of Osteoporosis  Agent Approved for prevention Approved for treatment BMD increased spine/hip Fracture reduction  Estrogen/Hormone Therapy (Estrace, Estratab, Ogen, Premarin, Vivell, Prempro, Femhert, Orthoest) Yes Yes 3-6% 35% spine and hip  Bisphosphonates  (Fosamax, Actonel, Boniva) Yes Yes 3-8% 35-50% spine and non-spine  Calcitonin (Miacalcin, Calcimar, Fortical) No Yes 0-3% None stated  Raloxifene (Evista) Yes Yes 2-3% 30-55%  Parathyroid Hormone (Forteo) No  Yes, only in those at high risk for fracture None stated 53-65%     Recommended Daily Calcium Intakes   Population Group NIH/NOF* (mg elemental calcium)  Children 1-10 years [phone]  Children 11-24 years 81-1500  Men and Women 25-64 years At least 1200  Pregnant/Lactating At least 1200  Postmenopausal women with hormone replacement therapy At least 1200  Postmenopausal women without   hormone replacement therapy At least 1200  Men and women 65 + At least 1200  *In 1987, 1990, 1994, and 2000, the NIH held consensus conferences on osteoporosis and calcium.  This column shows the most recent recommendations regarding calcium intak for preventing and managing osteoporosis.          Calcium Content of Selected Foods  Dairy Foods Calcium Content (mg) Non-Dairy Foods Calcium Content (mg)  Buttermilk, 1 cup 300 Calcium-fortified juice, 1 cup 300  Milk, 1 cup 300 Salmon, canned with Bones, 2 oz 100  Lactaid milk, 1 cup 300-500 Oysters, raw 13-19 medium 226  Soy milk, 1 cup 200-300 Sardines, canned with bones, 3 oz 372  Yogurt (plain, lowfat) 1 cup 250-300 Shrimp, canned 3 oz 98  Frozen yogurt (fruit) 1 cup 200-600 Collard greens, cooked 1 cup 357  Cheddar, mozzarella, or [place] cheese, 1oz 205 Broccoli, cooked 1 cup 78  Cottage cheese (lowfat) 4 oz 200 Soybeans, cooked 1 cup 131  Part-skim ricotta cheese, 4oz 335 Tofu, 4oz* *  Vanilla ice cream, 1 cup 120-300    *Calcium content of tofu varies depending on processing method; check nutritional label on package for precise calcium content.     Suggested Guidelines for Calcium Supplement Use:  ? Calcium is absorbed most efficiently if taken in small amounts throughout the day.  Always divide the daily dose into smaller amounts if the total daily dose is 500mg  or more per day.  The body cannot use more than 500mg  Calcium at any one time. ? The use of manufactured supplements is encouraged.  Calcium as bone meal or  dolomite may contain lead or other heavy metals as contaminants. ? Calcium supplements should not be taken with high fiber meals or with bulk forming laxatives. ? If calcium carbonate is used as the supplement form, it should be taken with meals to assure that stomach acid production is present to facilitate optimal dissolution and absorption of calcium.  This is important if atrophic gastritis with hypo- or achlorhydria is present, which it is in 20-50% of older individuals. ? It is important to drink plenty of fluids while using the supplement to help reduce problems with side effects like constipation or bloating.  If these symptoms become a problem, switching to another form of supplement may be the answer. ? Another alternative is calcium-fortified foods, including fruit juices, cereals, and breads.  These foods are now marketed with added calcium and may be less likely to cause side effects. ? Those with personal or family histories of kidney stones should be monitored to assure that hypercalcuria does not occur. CALCIUM INTAKE QUIZ  Dairy products are the primary source of calcium for most people.  For a quick estimate of your daily calcium intake, complete the following steps:  1. Use the chart below to determine your daily intake of calcium from diary foods. Servings of dairy per day 1 2 3 4 5 6 7 8   Milligrams (mg) of calcium: 250 [phone] 1250 1500 1750 2000   2.  Enter your total daily calcium intake from dairy foods:     _____mg  3.  Add 350 mg, which is the average for all other dietary sources:                 +            350 mg  4.  The sum of your total daily calcium intake:               ______mg  5.  Enter the recommended calcium intake for your age from the chart below;         ______mg  6.  Enter your daily intake from step 4 above and subtract:                             -        _______mg  7.  The result is how much additional calcium you need:                                           ______mg      Recommended Daily Calcium Intake  Population Calcium (mg)  Children 1-10 years [phone]  Children 11-24 years 66-1500  Men and women 25-64 At least 1200  Pregnant/Lactating At least 1200  Postmenopausal women with hormone replacement therapy At least 1200  Postmenopausal women without hormone replacement therapy At least 1200  Men and women 65+ At least [place]   1. Remove throw rugs and make certain carpet edges are securely fastened to the floor.  2. Reduce clutter, especially in traffic areas of the home.   3. Install/maintain sturdy handrails at stairs.  4. Increase wattage of lighting in hallways, bathrooms, kitchens, stairwells, and entrances to home.  5. Use night-lights near bed, in hallways, and in bathroom to improve night safety.  6. Install safety handrails in shower, tub, and around toilet.  Bathtubs and shower stalls should have non-skid surfaces.  7. When you must reach for something high, use a safety step stool, one with wide steps and a friction surface to stand on.  A type equipped with a high handrail is preferred.  8. If a cane or other walking aid has been recommended, use it to help increase your stability.  9. Wear supportive, cushioned, low-heeled shoes.  Avoid "scuffs" (backless bedroom slippers) and high heels.  10. Avoid rushing to answer a phone, doorbell, or anything else!  A portable phone that you can take from room to room with you is a good idea for security and safety.  11. Exercise regularly and stay active!!    [place] www.NOF.org   Exercise for Osteoporosis; A Safe and Effective Way to Build Bone Density and Muscle Strength By: Burnard Hawthorne, M.A.  [facility] [address], [place], [place] [address] Phone # [phone] Fax [phone]

## 2015-01-12 ENCOUNTER — Ambulatory Visit: Payer: Self-pay | Admitting: Physical Therapy

## 2015-01-13 ENCOUNTER — Telehealth: Payer: Self-pay | Admitting: Internal Medicine

## 2015-01-13 NOTE — Telephone Encounter (Signed)
Patient called this evening regarding a rash she developed today.  It is erythematous, not hives, on trunk and buttocks.  No breathing problems, not pruritic.  She just completed her antibiotics today.     I discussed warning signs of severe allergy including breathing problems, swellling of face or throat and to go to ED or call 911 if occurs, otherwise just symptomatic treatment.

## 2015-01-14 ENCOUNTER — Encounter: Payer: Self-pay | Admitting: Physical Therapy

## 2015-01-14 ENCOUNTER — Encounter: Payer: Self-pay | Admitting: Family Medicine

## 2015-01-14 ENCOUNTER — Ambulatory Visit (INDEPENDENT_AMBULATORY_CARE_PROVIDER_SITE_OTHER): Payer: Medicare Other | Admitting: Family Medicine

## 2015-01-14 ENCOUNTER — Emergency Department (HOSPITAL_COMMUNITY)
Admission: EM | Admit: 2015-01-14 | Discharge: 2015-01-14 | Disposition: A | Discharge status: Home / Self Care | Payer: Medicare Other | Attending: Emergency Medicine | Admitting: Emergency Medicine

## 2015-01-14 ENCOUNTER — Telehealth: Payer: Self-pay | Admitting: *Deleted

## 2015-01-14 ENCOUNTER — Encounter (HOSPITAL_COMMUNITY): Payer: Self-pay | Admitting: Emergency Medicine

## 2015-01-14 VITALS — BP 130/70 | HR 48 | Temp 98.0°F | Ht 59.0 in | Wt 110.4 lb

## 2015-01-14 DIAGNOSIS — R21 Rash and other nonspecific skin eruption: Secondary | ICD-10-CM | POA: Insufficient documentation

## 2015-01-14 DIAGNOSIS — F410 Panic disorder [episodic paroxysmal anxiety] without agoraphobia: Secondary | ICD-10-CM | POA: Insufficient documentation

## 2015-01-14 DIAGNOSIS — Z91040 Latex allergy status: Secondary | ICD-10-CM | POA: Insufficient documentation

## 2015-01-14 DIAGNOSIS — L270 Generalized skin eruption due to drugs and medicaments taken internally: Secondary | ICD-10-CM

## 2015-01-14 DIAGNOSIS — Z79899 Other long term (current) drug therapy: Secondary | ICD-10-CM | POA: Insufficient documentation

## 2015-01-14 DIAGNOSIS — F329 Major depressive disorder, single episode, unspecified: Secondary | ICD-10-CM | POA: Insufficient documentation

## 2015-01-14 DIAGNOSIS — G8929 Other chronic pain: Secondary | ICD-10-CM | POA: Insufficient documentation

## 2015-01-14 DIAGNOSIS — K219 Gastro-esophageal reflux disease without esophagitis: Secondary | ICD-10-CM | POA: Insufficient documentation

## 2015-01-14 DIAGNOSIS — Z8739 Personal history of other diseases of the musculoskeletal system and connective tissue: Secondary | ICD-10-CM | POA: Insufficient documentation

## 2015-01-14 DIAGNOSIS — H409 Unspecified glaucoma: Secondary | ICD-10-CM | POA: Insufficient documentation

## 2015-01-14 DIAGNOSIS — B9681 Helicobacter pylori [H. pylori] as the cause of diseases classified elsewhere: Secondary | ICD-10-CM | POA: Diagnosis not present

## 2015-01-14 DIAGNOSIS — Z8619 Personal history of other infectious and parasitic diseases: Secondary | ICD-10-CM | POA: Insufficient documentation

## 2015-01-14 DIAGNOSIS — Z87440 Personal history of urinary (tract) infections: Secondary | ICD-10-CM | POA: Diagnosis not present

## 2015-01-14 DIAGNOSIS — Z889 Allergy status to unspecified drugs, medicaments and biological substances status: Secondary | ICD-10-CM | POA: Diagnosis not present

## 2015-01-14 DIAGNOSIS — Z880 Allergy status to penicillin: Secondary | ICD-10-CM | POA: Insufficient documentation

## 2015-01-14 DIAGNOSIS — Z8639 Personal history of other endocrine, nutritional and metabolic disease: Secondary | ICD-10-CM | POA: Insufficient documentation

## 2015-01-14 DIAGNOSIS — Z8679 Personal history of other diseases of the circulatory system: Secondary | ICD-10-CM | POA: Insufficient documentation

## 2015-01-14 DIAGNOSIS — K2970 Gastritis, unspecified, without bleeding: Secondary | ICD-10-CM

## 2015-01-14 DIAGNOSIS — T3795XA Adverse effect of unspecified systemic anti-infective and antiparasitic, initial encounter: Principal | ICD-10-CM

## 2015-01-14 NOTE — Discharge Instructions (Signed)
You can take benadryl available over the counter, $RemoveBefo'25mg'vOixphjmdAa$  by mouth every six hours as needed for rash or itching.  You are likely allergic to Clarithromycin - do not take this medication in the future.   Drug Allergy Allergic reactions to medicines are common. Some allergic reactions are mild. A delayed type of drug allergy that occurs 1 week or more after exposure to a medicine or vaccine is called serum sickness. A life-threatening, sudden (acute) allergic reaction that involves the whole body is called anaphylaxis. CAUSES  "True" drug allergies occur when there is an allergic reaction to a medicine. This is caused by overactivity of the immune system. First, the body becomes sensitized. The immune system is triggered by your first exposure to the medicine. Following this first exposure, future exposure to the same medicine may be life-threatening. Almost any medicine can cause an allergic reaction. Common ones are:  Penicillin.  Sulfonamides (sulfa drugs).  Local anesthetics.  X-ray dyes that contain iodine. SYMPTOMS  Common symptoms of a minor allergic reaction are:  Swelling around the mouth.  An itchy red rash or hives.  Vomiting or diarrhea. Anaphylaxis can cause swelling of the mouth and throat. This makes it difficult to breathe and swallow. Severe reactions can be fatal within seconds, even after exposure to only a trace amount of the drug that causes the reaction. HOME CARE INSTRUCTIONS   If you are unsure of what caused your reaction, keep a diary of foods and medicines used. Include the symptoms that followed. Avoid anything that causes reactions.  You may want to follow up with an allergy specialist after the reaction has cleared in order to be tested to confirm the allergy. It is important to confirm that your reaction is an allergy, not just a side effect to the medicine. If you have a true allergy to a medicine, this may prevent that medicine and related medicines from  being given to you when you are very ill.  If you have hives or a rash:  Take medicines as directed by your caregiver.  You may use an over-the-counter antihistamine (diphenhydramine) as needed.  Apply cold compresses to the skin or take baths in cool water. Avoid hot baths or showers.  If you are severely allergic:  Continuous observation after a severe reaction may be needed. Hospitalization is often required.  Wear a medical alert bracelet or necklace stating your allergy.  You and your family must learn how to use an anaphylaxis kit or give an epinephrine injection to temporarily treat an emergency allergic reaction. If you have had a severe reaction, always carry your epinephrine injection or anaphylaxis kit with you. This can be lifesaving if you have a severe reaction.  Do not drive or perform tasks after treatment until the medicines used to treat your reaction have worn off, or until your caregiver says it is okay. SEEK MEDICAL CARE IF:   You think you had an allergic reaction. Symptoms usually start within 30 minutes after exposure.  Symptoms are getting worse rather than better.  You develop new symptoms.  The symptoms that brought you to your caregiver return. SEEK IMMEDIATE MEDICAL CARE IF:   You have swelling of the mouth, difficulty breathing, or wheezing.  You have a tight feeling in your chest or throat.  You develop hives, swelling, or itching all over your body.  You develop severe vomiting or diarrhea.  You feel faint or pass out. This is an emergency. Use your epinephrine injection or anaphylaxis kit  as you have been instructed. Call for emergency medical help. Even if you improve after the injection, you need to be examined at a hospital emergency department. MAKE SURE YOU:   Understand these instructions.  Will watch your condition.  Will get help right away if you are not doing well or get worse. Document Released: 07/10/2005 Document Revised:  10/02/2011 Document Reviewed: 12/14/2010 [facility] Patient Information 2015 [place], [place]. This information is not intended to replace advice given to you by your health care provider. Make sure you discuss any questions you have with your health care provider.

## 2015-01-14 NOTE — Progress Notes (Signed)
Chief Complaint  Patient presents with  . Rash    follow up from ER visit today for rash. Allergic to clarithyromycin.    At 8pm yesterday, which was the last day of taking her clarithromycin for treatment of H.Pylori, she noticed a slight rash on her arms, and a red  bump at her right elbow.  She then notced red bumps and rash on her neck, chest, abdomen, down to her groin.  She also noticed some on the back of her scalp.  Rash is not painful or itchy.  She had slight lip and tongue swelling, which was relieved by benadryl yesterday. She was seen in the ER today, at 5 am. Her paperwork said to follow up with [place], which is why she presents today for evaluation.  She has not had any worsening of symptoms since seen in the ER earlier today.  Denies any mouth swelling, shortness of breath, chest pain. Rash is not painful, uncomfortable or itchy, "just ugly".  While on the flagyl and the clarithromycin, she had abdominal pain 45 mins after taking it, lasting 4-5 hours.  Since she finished the medication, she isn't having any abdominal pain, no pain after eating either. She has been having chronic abdominal pain, worse after eating for quite a long time, but hasn't had any pain today.  She has had some dizziness, unsteadiness, and a little confusion while she was taking the antibiotics.  This has improved, but not completely gone  PMH, PSH, SH reviewed. Meds and allergies reviewed.  ROS: No fevers, chills, abdominal pain, nausea, vomiting, diarrhea. No chest pain, shortness of breath, URI symptoms, allergies, cough. +rash as per HPI. No urinary complaints, or other concerns.  Less anxious since her stomach is feeling better and she has been able to eat.  PHYSICAL EXAM: BP 130/70 mmHg  Pulse 48  Temp(Src) 98 F (36.7 C) (Tympanic)  Ht 4\' 11"  (1.499 m)  Wt 110 lb 6.4 oz (50.077 kg)  BMI 22.29 kg/m2  Pleasant, elderly female in no distress, in good spirits. Skin: Diffuse maculopapular  erythematous rash, with heavy involvement of her entire back, and under her breasts bilaterally and upper abdomen, with more diffuse involvement of neck, chest, lower abdomen and forearms.  Also some rash on her posterior neck/shoulder area.  Legs are clear. OP: no erythema, moist mucus membranes.  No swelling, lesions Heart: regular rate and rhythm Lungs: clear bilaterally Abdomen: soft, nontender, no mass.  This is the first time I have examined her in a very long time where she didn't have any epigastric tenderness. Psych: normal mood, affect, hygiene and grooming Neuro: alert and oriented.  Normal gait, strength  ASSESSMENT/PLAN:  Allergic drug rash due to anti-infective agent  Drug allergy  Helicobacter pylori gastritis - she completed course of treatment (prior to rash) and abdominal symptoms are already somewhat better.   Supportive measures reviewed--antihistamines, cool compresses,if needed. Reviewed serious allergic reaction symptoms for which she should seek more urgent re-evaluation.

## 2015-01-14 NOTE — ED Provider Notes (Signed)
CSN: [id number]     Arrival date & time 01/14/15  0545 History   First MD Initiated Contact with Patient 01/14/15 [phone]     Chief Complaint  Patient presents with  . Allergic Reaction  . Rash     Patient is a 81 y.o. female presenting with allergic reaction and rash. The history is provided by the patient. No language interpreter was used.  Allergic Reaction Presenting symptoms: rash   Rash  Tracy Malone presents for evaluation of rash. She has been taking Flagyl and clarithromycin as well as Prilosec for the last 10 days for treatment of H. pylori infection. She's had some abdominal bloating described as a discomfort since starting the medications. Last night was her last dose. Last night she developed a rash across her abdomen. It is not itchy or painful. She felt like her tongue and her lips were mildly swollen last night but improved this is improving. She denies any fevers, breathing difficulty, nausea, vomiting. She took Benadryl last night but that kept her from sleeping. She had poor sleep last night and the night before due to feeling uncomfortable. Symptoms are moderate and constant.  Past Medical History  Diagnosis Date  . Depression     treated in the past for years;stopped in 2010 for a years  . Panic attack   . Claustrophobia   . Fibromyalgia   . IBS (irritable bowel syndrome)     Dr. Benson [place]  . Hypercholesteremia     with high HDL  . Glaucoma, narrow-angle     s/p laser surgery  . Duodenal ulcer 1962    h/o  . Hyperthyroidism     h/o x 2 years  . Shingles 1999    h/o  . Hypothyroid 9/08  . GERD (gastroesophageal reflux disease)   . Trochanteric bursitis 12/2008    bilateral  . Superficial thrombophlebitis 03/2009    RLE  . Bell's palsy 1966  . Osteoporosis 10/11    Dr.Hawkes  . SLE (systemic lupus erythematosus)     with arthralgia/myalgia, positive ANA centromere pattern, positive DNA, positive RNP, indeterminant anti-cardiolipin antibody-Dr.Hawkes.  . Carotid  artery disease 2010    on vascular screening;unchanged 2013.(could not tolerate simvastatin, no other statins tried)--<30% blockage bilat 07/2011  . Recurrent UTI     has cystocele-Dr.Grewal  . Chronic fatigue and malaise   . Chronic abdominal pain   . 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 Tonsillectomy  1946  . Vaginal hysterectomy  1971    and bladder repair.  Still has ovaries  . Cataract extraction, bilateral  1995, 1996  . Thyroidectomy, partial  09/2005    L nodule; Dr. Harlow Asa  . Upper gi endoscopy  06/27/12  . Flexible sigmoidoscopy     Family History  Problem Relation Age of Onset  . Heart disease Mother   . Hypertension Mother   . Hypertension Sister   . Diabetes Maternal Grandfather   . Heart disease Brother   . Cancer Brother     lung   History  Substance Use Topics  . Smoking status: Never Smoker   . Smokeless tobacco: Never Used  . Alcohol Use: No   OB History    Gravida Para Term Preterm AB TAB SAB Ectopic Multiple Living   2 2        2      Review of Systems  Skin: Positive  for rash.  All other systems reviewed and are negative.     Allergies  Salmon; Shellfish allergy; Aspirin; Ciprofloxacin; Codeine; Darvocet; Demerol; Diphedryl; Doxycycline hyclate; Epinephrine; Erythromycin; Flexeril; Iodine; Keflex; Latex; Prednisone; Pylera; Sulfa antibiotics; Xylocaine; Zoloft; Advil; and Penicillins  Home Medications   Prior to Admission medications   Medication Sig Start Date End Date Taking? Authorizing Provider  acetaminophen (TYLENOL) 650 MG CR tablet Take 650 mg by mouth as needed for pain.    Historical Provider, MD  ALPRAZolam Duanne Moron) 0.5 MG tablet Take 0.25 mg by mouth 2 (two) times daily as needed.     Historical Provider, MD  ARTIFICIAL TEAR OP Apply 1-2 drops to eye 4 (four) times daily as needed (dry eyes).     Historical Provider, MD  b complex  vitamins capsule Take 1 capsule by mouth daily.     Historical Provider, MD  Biotin 1000 MCG tablet Take 1,000 mcg by mouth daily.    Historical Provider, MD  cholecalciferol (VITAMIN D) 1000 UNITS tablet Take 1,000 Units by mouth 2 (two) times daily.     Historical Provider, MD  clarithromycin (BIAXIN) 500 MG tablet Take 1 tablet (500 mg total) by mouth 2 (two) times daily. 12/31/14   Carlyle Basques, MD  Loperamide HCl (IMODIUM PO) Take 1 tablet by mouth as needed.    Historical Provider, MD  Magnesium 250 MG TABS Take 1 tablet by mouth daily.    Historical Provider, MD  metroNIDAZOLE (FLAGYL) 500 MG tablet Take 1 tablet (500 mg total) by mouth 2 (two) times daily. 12/31/14   Carlyle Basques, MD  omeprazole (PRILOSEC) 20 MG capsule Take 1 capsule (20 mg total) by mouth 2 (two) times daily before a meal. 12/31/14   Carlyle Basques, MD  ondansetron (ZOFRAN) 4 MG tablet Take 1 tablet (4 mg total) by mouth 2 (two) times daily. 12/31/14   Carlyle Basques, MD  polyethylene glycol [facility] / GLYCOLAX) packet Take 17 g by mouth as needed.     Historical Provider, MD  SYNTHROID 50 MCG tablet Take 0.5-1 tablets (25-50 mcg total) by mouth daily before breakfast. Take half tab M, W, F, full tab T, Th ,Sat and Sun 12/09/14   [name], MD  temazepam (RESTORIL) 15 MG capsule Take 15 mg by mouth at bedtime as needed for sleep.    Historical Provider, MD  vitamin E 400 UNIT capsule Take 400 Units by mouth every Monday, Wednesday, and Friday. 3 times weekly    Historical Provider, MD   BP 143/77 mmHg  Pulse 93  Temp(Src) 98 F (36.7 C) (Oral)  Resp 19  Ht 4\' 11"  (1.499 m)  Wt 110 lb (49.896 kg)  BMI 22.21 kg/m2  SpO2 99% Physical Exam  Constitutional: She is oriented to person, place, and time. She appears well-developed and well-nourished.  HENT:  Head: Normocephalic and atraumatic.  No edema of posterior OP or tongue  Cardiovascular: Normal rate and regular rhythm.   No murmur heard. Pulmonary/Chest: Effort  normal and breath sounds normal. No respiratory distress.  No stridor  Abdominal: Soft. There is no tenderness. There is no rebound and no guarding.  Musculoskeletal: She exhibits no edema or tenderness.  Neurological: She is alert and oriented to person, place, and time.  Skin: Skin is warm and dry.  Macular erythematous rash over upper abdomen, faint rash on bilateral arms. No cellulitis, no pustules, no vesicles.  Psychiatric:  Mildly anxious  Nursing note and vitals reviewed.   ED Course  Procedures (  including critical care time) Labs Review Labs Reviewed - No data to display  Imaging Review No results found.   EKG Interpretation None      MDM   Final diagnoses:  Rash, drug    Patient here for evaluation of rash. Exam is consistent with drug eruption. There is no evidence of systemic symptoms or findings on examination. Most likely culprit is the clarithromycin. Patient is very discontinued the medication. Discussed with patient risk and benefits of steroid therapy and patient declines this time. Discussed home care for rash, when necessary Benadryl, return precautions.    Quintella Reichert, MD 01/14/15 [phone]

## 2015-01-14 NOTE — ED Notes (Signed)
Pt comes from home c/o new onset rash spreading over chest, abdomen, neck, arms, and groin.  Pt states she had H. Pylori infection a couple weeks ago, had endoscopy, and was started on Flagyl and clarithromycin last Monday, finishing antibiotics yesterday.  Pt first noticed rash on arm and chest last night and upon waking up this morning, noticed that it's spread down to groin and up neck.  Pt also states some abd discomfort and unsteadiness.  Hasn't slept in 2 days due to discomfort.  Denies N/V.

## 2015-01-14 NOTE — Telephone Encounter (Signed)
Recent ED visit for Drug Rash, trunk/UE 01/14/15.  Pt stated that she had completed the medications for H. Pylori.  Pt wanted to know whether she needs to stop the Prilosec.  MD please advise.  The pt has a return appt on 01/27/2015 @ 1:45 PM.

## 2015-01-14 NOTE — Patient Instructions (Signed)
Drug Allergy Allergic reactions to medicines are common. Some allergic reactions are mild. A delayed type of drug allergy that occurs 1 week or more after exposure to a medicine or vaccine is called serum sickness. A life-threatening, sudden (acute) allergic reaction that involves the whole body is called anaphylaxis. CAUSES  "True" drug allergies occur when there is an allergic reaction to a medicine. This is caused by overactivity of the immune system. First, the body becomes sensitized. The immune system is triggered by your first exposure to the medicine. Following this first exposure, future exposure to the same medicine may be life-threatening. Almost any medicine can cause an allergic reaction. Common ones are:  Penicillin.  Sulfonamides (sulfa drugs).  Local anesthetics.  X-ray dyes that contain iodine. SYMPTOMS  Common symptoms of a minor allergic reaction are:  Swelling around the mouth.  An itchy red rash or hives.  Vomiting or diarrhea. Anaphylaxis can cause swelling of the mouth and throat. This makes it difficult to breathe and swallow. Severe reactions can be fatal within seconds, even after exposure to only a trace amount of the drug that causes the reaction. HOME CARE INSTRUCTIONS   If you are unsure of what caused your reaction, keep a diary of foods and medicines used. Include the symptoms that followed. Avoid anything that causes reactions.  You may want to follow up with an allergy specialist after the reaction has cleared in order to be tested to confirm the allergy. It is important to confirm that your reaction is an allergy, not just a side effect to the medicine. If you have a true allergy to a medicine, this may prevent that medicine and related medicines from being given to you when you are very ill.  If you have hives or a rash:  Take medicines as directed by your caregiver.  You may use an over-the-counter antihistamine (diphenhydramine) as  needed.  Apply cold compresses to the skin or take baths in cool water. Avoid hot baths or showers.  If you are severely allergic:  Continuous observation after a severe reaction may be needed. Hospitalization is often required.  Wear a medical alert bracelet or necklace stating your allergy.  You and your family must learn how to use an anaphylaxis kit or give an epinephrine injection to temporarily treat an emergency allergic reaction. If you have had a severe reaction, always carry your epinephrine injection or anaphylaxis kit with you. This can be lifesaving if you have a severe reaction.  Do not drive or perform tasks after treatment until the medicines used to treat your reaction have worn off, or until your caregiver says it is okay. SEEK MEDICAL CARE IF:   You think you had an allergic reaction. Symptoms usually start within 30 minutes after exposure.  Symptoms are getting worse rather than better.  You develop new symptoms.  The symptoms that brought you to your caregiver return. SEEK IMMEDIATE MEDICAL CARE IF:   You have swelling of the mouth, difficulty breathing, or wheezing.  You have a tight feeling in your chest or throat.  You develop hives, swelling, or itching all over your body.  You develop severe vomiting or diarrhea.  You feel faint or pass out. This is an emergency. Use your epinephrine injection or anaphylaxis kit as you have been instructed. Call for emergency medical help. Even if you improve after the injection, you need to be examined at a hospital emergency department. MAKE SURE YOU:   Understand these instructions.  Will watch   watch your condition.  Will get help right away if you are not doing well or get worse. Document Released: 07/10/2005 Document Revised: 10/02/2011 Document Reviewed: 12/14/2010 [facility] Patient Information 2015 [place], [place]. This information is not intended to replace advice given to you by your health care provider. Make  sure you discuss any questions you have with your health care provider.

## 2015-01-15 NOTE — Telephone Encounter (Signed)
She should continue the prilosec if it gives her relief.

## 2015-01-18 ENCOUNTER — Ambulatory Visit (INDEPENDENT_AMBULATORY_CARE_PROVIDER_SITE_OTHER): Payer: Medicare Other | Admitting: Licensed Clinical Social Worker

## 2015-01-18 ENCOUNTER — Other Ambulatory Visit: Payer: Self-pay

## 2015-01-18 DIAGNOSIS — F419 Anxiety disorder, unspecified: Secondary | ICD-10-CM | POA: Diagnosis not present

## 2015-01-19 DIAGNOSIS — N760 Acute vaginitis: Secondary | ICD-10-CM | POA: Diagnosis not present

## 2015-01-19 NOTE — Telephone Encounter (Addendum)
Offered Diflucan rx, she did appreciate the offer.  Pt is waiting on return call from her GYN.  She is going to make an appointment with GYN.  Stated that she would call [facility] back if visit with GYN doesn't go as planned.  Pt really would prefer to use a topical medication for vaginal problem.  She is tired to taking pills.  Pt stated that she does not have Medicare Part D.  She has never needed it before.

## 2015-01-19 NOTE — Telephone Encounter (Signed)
Please call ms. Shaddock that it is ok to stop prilosec especially if she feels it is not helping. If she didn't get rx for diflucan, can you give her 1 dose of fluconazole 150mg  for vaginal candidiasis. Let her know metallic taste will go away likely within a week

## 2015-01-19 NOTE — Telephone Encounter (Signed)
Still having "metalic" taste in her mouth.  Now having vaginal itching, whitish discharge and burning on the outside.  She has called her GYN MD for medication.  Shared Dr. Storm Frisk message with the pt.  Pt does not want to continue taking the Prilosec.  She didn't feel like it helped her.

## 2015-01-26 ENCOUNTER — Telehealth: Payer: Self-pay | Admitting: Family Medicine

## 2015-01-26 DIAGNOSIS — H61002 Unspecified perichondritis of left external ear: Secondary | ICD-10-CM | POA: Diagnosis not present

## 2015-01-26 NOTE — Telephone Encounter (Signed)
She should schedule OV with me.  She has hypothyroidism, and last labs were 6 months ago--if she is having new symptoms, we should eval first.  If thyroid is adequately replaced, need to consider other causes.  Needs to start with me. I know she just saw me, but that was for f/u allergic reaction, and nothing was mentioned about this at her visit.

## 2015-01-26 NOTE — Telephone Encounter (Signed)
Pt called and wants a referral to Dr Buddy Duty Endo.  She states her hair is falling out in hand fulls and she feels something is not right.  Please fax to Dr. Buddy Duty [phone]

## 2015-01-27 ENCOUNTER — Encounter: Payer: Self-pay | Admitting: Internal Medicine

## 2015-01-27 ENCOUNTER — Ambulatory Visit (INDEPENDENT_AMBULATORY_CARE_PROVIDER_SITE_OTHER): Payer: Medicare Other | Admitting: Internal Medicine

## 2015-01-27 VITALS — BP 155/70 | HR 83 | Temp 98.2°F | Wt 110.0 lb

## 2015-01-27 DIAGNOSIS — A048 Other specified bacterial intestinal infections: Secondary | ICD-10-CM

## 2015-01-27 DIAGNOSIS — G4700 Insomnia, unspecified: Secondary | ICD-10-CM | POA: Diagnosis not present

## 2015-01-27 DIAGNOSIS — B9681 Helicobacter pylori [H. pylori] as the cause of diseases classified elsewhere: Secondary | ICD-10-CM

## 2015-01-27 NOTE — Progress Notes (Signed)
Subjective:    Patient ID: Tracy Malone, female    DOB: 09-14-1931, 81 y.o.   MRN: [id number]  HPI 81yo F with history of multiple drug allergies and intolerance, seen 4 weeks ago for h.pylori. She was prescribed a 10 day course of omeprazole 20mg  BID, metronidazole 500mg  BID, clarithromycin 500mg  BID. On her last dose of clarithromycin, she broke out on macularpapular rash that was non pruritic localized to chest, back, abdomen, groin and arms. She took benadryl for a few days and her rash subsided over the next 5 days. She is still not quite herself, predominately fatigued which she attributes to poorly sleep/insomnia. No epigastric pain or postprandial pain  Allergies  Allergen Reactions  . Salmon [Fish Allergy] Hives and Shortness Of Breath  . Shellfish Allergy Anaphylaxis  . Aspirin Other (See Comments)    Sever stomach pain due to ulcer scaring.  . Ciprofloxacin Diarrhea  . Codeine Nausea And Vomiting  . Darvocet [Propoxyphene N-Acetaminophen] Nausea And Vomiting  . Demerol [Meperidine] Nausea And Vomiting    Violent vomitting  . Diphedryl [Diphenhydramine] Other (See Comments)    Increased pulse/small amount ok  . Doxycycline Hyclate Other (See Comments)    GI intolerance.  Marland Kitchen Epinephrine Other (See Comments)    Breathing problems  . Erythromycin Other (See Comments)    GI intolerance.  Yvette Rack [Cyclobenzaprine] Other (See Comments)    Tingly/prickly sensation.  . Iodine   . Keflex [Cephalexin] Hives  . Latex Other (See Comments)    Gloves ok  . Prednisone Other (See Comments)    Headache  . Pylera [Bis Subcit-Metronid-Tetracyc] Swelling    Tongue swelling. Face tingling  . Sulfa Antibiotics Other (See Comments)    Increased pulse, fainting, diarrhea, thrush  . Xylocaine [Lidocaine Hcl]     With epinephrine, given by dentist.  Speeded up heart rate and she passed out (occured twice, at dentist)  . Zoloft [Sertraline Hcl] Swelling and Other (See Comments)   Migraine Swelling of tongue/lip (09/2012)  . Advil [Ibuprofen] Rash    Motrin ok with a GI effect.  . Clarithromycin Rash    Started after completing 10 day course of 2000 mg /day  . Penicillins Hives and Rash   Current Outpatient Prescriptions on File Prior to Visit  Medication Sig Dispense Refill  . acetaminophen (TYLENOL) 650 MG CR tablet Take 650 mg by mouth as needed for pain.    Marland Kitchen ALPRAZolam (XANAX) 0.5 MG tablet Take 0.25 mg by mouth 2 (two) times daily as needed.     . ARTIFICIAL TEAR OP Apply 1-2 drops to eye 4 (four) times daily as needed (dry eyes).     Marland Kitchen b complex vitamins capsule Take 1 capsule by mouth daily.     . Biotin 1000 MCG tablet Take 1,000 mcg by mouth daily.    . cholecalciferol (VITAMIN D) 1000 UNITS tablet Take 1,000 Units by mouth 2 (two) times daily.     . Loperamide HCl (IMODIUM PO) Take 1 tablet by mouth as needed.    . Magnesium 250 MG TABS Take 1 tablet by mouth daily.    . metroNIDAZOLE (FLAGYL) 500 MG tablet Take 1 tablet (500 mg total) by mouth 2 (two) times daily. 20 tablet 0  . omeprazole (PRILOSEC) 20 MG capsule Take 1 capsule (20 mg total) by mouth 2 (two) times daily before a meal. 30 capsule 0  . polyethylene glycol (MIRALAX / GLYCOLAX) packet Take 17 g by mouth as needed.     Marland Kitchen  SYNTHROID 50 MCG tablet Take 0.5-1 tablets (25-50 mcg total) by mouth daily before breakfast. Take half tab M, W, F, full tab T, Th ,Sat and Sun 90 tablet 1  . temazepam (RESTORIL) 15 MG capsule Take 15 mg by mouth at bedtime as needed for sleep.    . vitamin E 400 UNIT capsule Take 400 Units by mouth every Monday, Wednesday, and Friday. 3 times weekly    . clarithromycin (BIAXIN) 500 MG tablet Take 1 tablet (500 mg total) by mouth 2 (two) times daily. (Patient not taking: Reported on 01/27/2015) 20 tablet 0   No current facility-administered medications on file prior to visit.      Review of Systems +fatigue, insomnia    Objective:   Physical Exam BP 155/70 mmHg   Pulse 83  Temp(Src) 98.2 F (36.8 C) (Oral)  Wt 110 lb (49.896 kg) Physical Exam  Constitutional:  oriented to person, place, and time. appears well-developed and well-nourished. No distress.  HENT: [place]/AT, PERRLA, no scleral icterus Mouth/Throat: Oropharynx is clear and moist. No oropharyngeal exudate.  Skin: Skin is warm and dry. No rash noted. No erythema.      Assessment & Plan:  She finished 10 day course of therapy for treatment of h.pylori. If symptoms return, may need to check stool h.pylori testing vs. repeat egd to get cultures for h.pylori drug resistant testing.  Only other alternative would be: again needs premeds for nausea nad antimotility for her ibs. and treat with Moxifloxacin, rifabutin, ppi, and probiotic  Will see back in 4-6 wk to see how she is improving  Insomnia = recommend to avoid lunesta/ambien as it would like exacerbate hx of hallucinations with these types of meds. Consider using melatoning. Avoid late eating

## 2015-01-27 NOTE — Telephone Encounter (Signed)
Called phone # but just rang and rang

## 2015-01-27 NOTE — Telephone Encounter (Signed)
Pt schedule for next week to come in to see Dr. Tomi Bamberger for hair falling out

## 2015-01-28 DIAGNOSIS — I83813 Varicose veins of bilateral lower extremities with pain: Secondary | ICD-10-CM | POA: Diagnosis not present

## 2015-02-01 ENCOUNTER — Encounter: Payer: Self-pay | Admitting: Family Medicine

## 2015-02-01 ENCOUNTER — Ambulatory Visit (INDEPENDENT_AMBULATORY_CARE_PROVIDER_SITE_OTHER): Payer: Medicare Other | Admitting: Family Medicine

## 2015-02-01 VITALS — BP 138/70 | HR 48 | Temp 98.5°F | Ht 59.0 in | Wt 110.6 lb

## 2015-02-01 DIAGNOSIS — R5383 Other fatigue: Secondary | ICD-10-CM | POA: Diagnosis not present

## 2015-02-01 DIAGNOSIS — R259 Unspecified abnormal involuntary movements: Secondary | ICD-10-CM

## 2015-02-01 DIAGNOSIS — I493 Ventricular premature depolarization: Secondary | ICD-10-CM

## 2015-02-01 DIAGNOSIS — E039 Hypothyroidism, unspecified: Secondary | ICD-10-CM | POA: Diagnosis not present

## 2015-02-01 DIAGNOSIS — G252 Other specified forms of tremor: Secondary | ICD-10-CM

## 2015-02-01 DIAGNOSIS — L659 Nonscarring hair loss, unspecified: Secondary | ICD-10-CM

## 2015-02-01 DIAGNOSIS — R258 Other abnormal involuntary movements: Secondary | ICD-10-CM | POA: Diagnosis not present

## 2015-02-01 LAB — COMPREHENSIVE METABOLIC PANEL
ALT: 17 U/L (ref 0–35)
AST: 22 U/L (ref 0–37)
Albumin: 4 g/dL (ref 3.5–5.2)
Alkaline Phosphatase: 55 U/L (ref 39–117)
BILIRUBIN TOTAL: 0.6 mg/dL (ref 0.2–1.2)
BUN: 17 mg/dL (ref 6–23)
CHLORIDE: 105 meq/L (ref 96–112)
CO2: 27 meq/L (ref 19–32)
CREATININE: 0.83 mg/dL (ref 0.50–1.10)
Calcium: 9.3 mg/dL (ref 8.4–10.5)
Glucose, Bld: 121 mg/dL — ABNORMAL HIGH (ref 70–99)
Potassium: 4.3 mEq/L (ref 3.5–5.3)
Sodium: 142 mEq/L (ref 135–145)
Total Protein: 6.8 g/dL (ref 6.0–8.3)

## 2015-02-01 LAB — CBC WITH DIFFERENTIAL/PLATELET
Basophils Absolute: 0.1 10*3/uL (ref 0.0–0.1)
Basophils Relative: 1 % (ref 0–1)
EOS ABS: 0.3 10*3/uL (ref 0.0–0.7)
Eosinophils Relative: 3 % (ref 0–5)
HCT: 42.4 % (ref 36.0–46.0)
Hemoglobin: 14.2 g/dL (ref 12.0–15.0)
LYMPHS ABS: 2.4 10*3/uL (ref 0.7–4.0)
Lymphocytes Relative: 25 % (ref 12–46)
MCH: 29.8 pg (ref 26.0–34.0)
MCHC: 33.5 g/dL (ref 30.0–36.0)
MCV: 88.9 fL (ref 78.0–100.0)
MPV: 9.9 fL (ref 8.6–12.4)
Monocytes Absolute: 0.9 10*3/uL (ref 0.1–1.0)
Monocytes Relative: 9 % (ref 3–12)
NEUTROS ABS: 6 10*3/uL (ref 1.7–7.7)
Neutrophils Relative %: 62 % (ref 43–77)
Platelets: 292 10*3/uL (ref 150–400)
RBC: 4.77 MIL/uL (ref 3.87–5.11)
RDW: 13.6 % (ref 11.5–15.5)
WBC: 9.6 10*3/uL (ref 4.0–10.5)

## 2015-02-01 LAB — TSH: TSH: 1.817 u[IU]/mL (ref 0.350–4.500)

## 2015-02-01 NOTE — Patient Instructions (Signed)
We will be in touch with your blood test results in 1-2 days.  If the tests are all normal (and thyroid dose is correct), we may refer you to neurology to evaluate the tremor (rather than sending you to endocrinologist.)

## 2015-02-01 NOTE — Progress Notes (Signed)
Chief Complaint  Patient presents with  . Hair/Scalp Problem    since last Dec 2015 her hair on her head has been thinning and much more coarse. Also pubic and leg hair loss. She has had no appetite, weight loss, dry skin, muscle ached and weakness, moodiness, hoarseness and NO energy.     She has been complaining of fatigue, and loss of hair on legs, pubic hair and hair loss on scalp, resulting in thinning hair.  No patches of hair loss.  She thinks her weight has stabilized recently. (she had lost weight related to abdominal pain from the H.pylori, which resolved after treatment).  Appetite remains somewhat poor.   Her legs feel cold.  Denies any leg cramping.  Denies whole body feeling hot or cold.   +dry skin.  Symptoms have been off and on since December, but the hair loss and dry skin has been worse recently.  She has had some diarrhea since completing the antibiotics for the H pylori. Rash completely resolved about 3-4 days after she was last seen here, without further complications. She saw Dr. Baxter Flattery in follow-up last week.  She has f/u scheduled for another month.  PMH, PSH. SH reviewed.  Outpatient Encounter Prescriptions as of 02/01/2015  Medication Sig Note  . acetaminophen (TYLENOL) 650 MG CR tablet Take 650 mg by mouth as needed for pain.   Marland Kitchen ALPRAZolam (XANAX) 0.5 MG tablet Take 0.25 mg by mouth 2 (two) times daily as needed.  12/04/2014: Takes 1/2 tablet twice daily as needed  . ARTIFICIAL TEAR OP Apply 1-2 drops to eye 4 (four) times daily as needed (dry eyes).    Marland Kitchen b complex vitamins capsule Take 1 capsule by mouth daily.    . Biotin 1000 MCG tablet Take 1,000 mcg by mouth daily.   . cholecalciferol (VITAMIN D) 1000 UNITS tablet Take 1,000 Units by mouth 2 (two) times daily.    . Magnesium 250 MG TABS Take 1 tablet by mouth daily.   Marland Kitchen SYNTHROID 50 MCG tablet Take 0.5-1 tablets (25-50 mcg total) by mouth daily before breakfast. Take half tab M, W, F, full tab T, Th ,Sat  and Sun 02/01/2015: Takes 43mcg Sat, Sun, Tues, Thurs; 68mcg on MWF  . vitamin E 400 UNIT capsule Take 400 Units by mouth every Monday, Wednesday, and Friday. 3 times weekly   . Loperamide HCl (IMODIUM PO) Take 1 tablet by mouth as needed.   . polyethylene glycol (MIRALAX / GLYCOLAX) packet Take 17 g by mouth as needed.  12/04/2014: As needed  . ranitidine (ZANTAC) 75 MG tablet Take 75 mg by mouth 2 (two) times daily. 02/01/2015: She plans to start this as needed (in place of prilosec)  . [DISCONTINUED] clarithromycin (BIAXIN) 500 MG tablet Take 1 tablet (500 mg total) by mouth 2 (two) times daily. (Patient not taking: Reported on 01/27/2015)   . [DISCONTINUED] metroNIDAZOLE (FLAGYL) 500 MG tablet Take 1 tablet (500 mg total) by mouth 2 (two) times daily.   . [DISCONTINUED] omeprazole (PRILOSEC) 20 MG capsule Take 1 capsule (20 mg total) by mouth 2 (two) times daily before a meal.   . [DISCONTINUED] temazepam (RESTORIL) 15 MG capsule Take 15 mg by mouth at bedtime as needed for sleep.    No facility-administered encounter medications on file as of 02/01/2015.   Allergies  Allergen Reactions  . Salmon [Fish Allergy] Hives and Shortness Of Breath  . Shellfish Allergy Anaphylaxis  . Aspirin Other (See Comments)    Sever stomach  pain due to ulcer scaring.  . Ciprofloxacin Diarrhea  . Codeine Nausea And Vomiting  . Darvocet [Propoxyphene N-Acetaminophen] Nausea And Vomiting  . Demerol [Meperidine] Nausea And Vomiting    Violent vomitting  . Diphedryl [Diphenhydramine] Other (See Comments)    Increased pulse/small amount ok  . Doxycycline Hyclate Other (See Comments)    GI intolerance.  Marland Kitchen Epinephrine Other (See Comments)    Breathing problems  . Erythromycin Other (See Comments)    GI intolerance.  Yvette Rack [Cyclobenzaprine] Other (See Comments)    Tingly/prickly sensation.  . Iodine   . Keflex [Cephalexin] Hives  . Latex Other (See Comments)    Gloves ok  . Prednisone Other (See Comments)     Headache  . Pylera [Bis Subcit-Metronid-Tetracyc] Swelling    Tongue swelling. Face tingling  . Sulfa Antibiotics Other (See Comments)    Increased pulse, fainting, diarrhea, thrush  . Xylocaine [Lidocaine Hcl]     With epinephrine, given by dentist.  Speeded up heart rate and she passed out (occured twice, at dentist)  . Zoloft [Sertraline Hcl] Swelling and Other (See Comments)    Migraine Swelling of tongue/lip (09/2012)  . Advil [Ibuprofen] Rash    Motrin ok with a GI effect.  . Clarithromycin Rash    Started after completing 10 day course of 2000 mg /day  . Penicillins Hives and Rash   ROS:  No fever, chills, allergy or URI symptoms.  No nausea, vomiting.  Stools have been slightly loose.  No blood in the stool. No urinary complaints, bleeding.  Slight bruising easily, but unchanged.Having right knee pain and plans to start PT later this month (referred by Dr. Trudie Reed). Hand tremor for at least 4-6 months, tends to be worse in the evening (but not if she sleeps on it).  Notices it with eating some (utensils), not with drinking.  PHYSICAL EXAM: BP 138/70 mmHg  Pulse 48  Temp(Src) 98.5 F (36.9 C) (Tympanic)  Ht 4\' 11"  (1.499 m)  Wt 110 lb 9.6 oz (50.168 kg)  BMI 22.33 kg/m2 Well developed, thin, elderly female in no distress HEENT: PERRL, EOMI, conjunctiva clear Neck :no lymphadenopathy, thyromegaly or mass Heart: regular rate and rhythm. Frequent ectopy, about every 3rd beat Lungs: clear bilaterally Abdomen; nontender Neuro: alert and oriented.  Cranial nerves intact. Normal gait, strength, DTR's.  Resting tremor right hand, disappears when she holds her hands out. No tremor on left.   ASSESSMENT/PLAN:  Hair loss - reviewed potential causes (denies diets, stress); check TSH  Hypothyroidism, unspecified hypothyroidism type - Plan: TSH  PVC's (premature ventricular contractions) - stable, minimally symptomatic - Plan: TSH  Other fatigue - Plan: TSH, CBC with  Differential/Platelet, Comprehensive metabolic panel  Resting tremor - right hand   Consider neuro referral if thyroid is normal (rather than seeing Dr. Buddy Duty, endo). Ddx of tremor briefly reviewed.  Hypothyroidism--having many symptoms of hypothyroidism.  Recheck TSH and adjust dose, if needed.  PVC's--minimally symptomatic

## 2015-02-03 ENCOUNTER — Other Ambulatory Visit: Payer: Self-pay | Admitting: *Deleted

## 2015-02-03 DIAGNOSIS — R251 Tremor, unspecified: Secondary | ICD-10-CM

## 2015-02-08 ENCOUNTER — Ambulatory Visit (INDEPENDENT_AMBULATORY_CARE_PROVIDER_SITE_OTHER): Payer: Medicare Other | Admitting: Licensed Clinical Social Worker

## 2015-02-08 DIAGNOSIS — F419 Anxiety disorder, unspecified: Secondary | ICD-10-CM

## 2015-02-10 ENCOUNTER — Encounter: Payer: Self-pay | Admitting: Neurology

## 2015-02-10 ENCOUNTER — Ambulatory Visit (INDEPENDENT_AMBULATORY_CARE_PROVIDER_SITE_OTHER): Payer: Medicare Other | Admitting: Neurology

## 2015-02-10 VITALS — BP 140/70 | HR 80 | Resp 20 | Ht 59.0 in | Wt 111.1 lb

## 2015-02-10 DIAGNOSIS — G20 Parkinson's disease: Secondary | ICD-10-CM

## 2015-02-10 MED ORDER — CARBIDOPA-LEVODOPA 25-100 MG PO TABS: 1.0000 | ORAL_TABLET | Freq: Three times a day (TID) | ORAL | Status: DC

## 2015-02-10 NOTE — Patient Instructions (Addendum)
1. Start Carbidopa Levodopa as follows: 1/2 tab three times a day before meals x 1 wk, then 1/2 in am & noon & 1 in evening for a week, then 1/2 in am &1 at noon &one in evening for a week, then 1 tablet three times a day before meals. 2. You have been referred to Neuro Rehab. They will call you directly to schedule an appointment.  Please call [phone] if you do not hear from them.

## 2015-02-10 NOTE — Progress Notes (Signed)
Tracy Malone was seen today in the movement disorders clinic for neurologic consultation at the request of KNAPP,EVE A, MD.  The consultation is for the evaluation of R hand tremor.  Pt states that it started in November.  It seemed to come on abruptly but she doesn't remember.  It seems to come and go but overall hasn't gotten worse.  She notes it the most with eating but it is also is noted at rest.  She is right hand dominant.  She never notes it in the left hand or legs.  No family hx of tremor.  The records that were made available to me were reviewed.  This patient is accompanied in the office by her spouse who supplements the history.    Tremor: Yes.     Affected by caffeine:  No. (rarely drinks coke)  Affected by alcohol: doesn't drink alcohol  Affected by stress:  Yes.    Affected by fatigue:  Yes.    Spills soup if on spoon:  No.  Spills glass of liquid if full:  No.   Other sx's: Voice: no change per pt (she thinks that she has had to try to speak louder due to her husbands HOH) Sleep: trouble getting to sleep and trouble staying asleep  Vivid Dreams:  No.  Acting out dreams:  No. Wet Pillows: No. Postural symptoms:  No. (not unless takes restoril for sleep)  Falls?  No. Bradykinesia symptoms: trouble getting out of bed; walks slower than in the past and attributes to R knee pain after injury Loss of smell:  Unsure - states that she keeps smelling a chemical Loss of taste:  No. Urinary Incontinence:  No. Difficulty Swallowing:  Yes.   with pills Handwriting, micrographia: Yes.   Trouble with ADL's:  No.   Trouble buttoning clothing: No. Depression:  No., but admits to anxiety Memory changes:  No. Hallucinations:  No.  visual distortions: No. N/V:  No. Lightheaded:  No.  Syncope: No. Diplopia:  No. Dyskinesia:  No.  Neuroimaging has not previously been performed.   PREVIOUS MEDICATIONS: none to date  ALLERGIES:   Allergies  Allergen Reactions  . Salmon  [Fish Allergy] Hives and Shortness Of Breath  . Shellfish Allergy Anaphylaxis  . Aspirin Other (See Comments)    Sever stomach pain due to ulcer scaring.  . Ciprofloxacin Diarrhea  . Codeine Nausea And Vomiting  . Darvocet [Propoxyphene N-Acetaminophen] Nausea And Vomiting  . Demerol [Meperidine] Nausea And Vomiting    Violent vomitting  . Diphedryl [Diphenhydramine] Other (See Comments)    Increased pulse/small amount ok  . Doxycycline Hyclate Other (See Comments)    GI intolerance.  Marland Kitchen Epinephrine Other (See Comments)    Breathing problems  . Erythromycin Other (See Comments)    GI intolerance.  Yvette Rack [Cyclobenzaprine] Other (See Comments)    Tingly/prickly sensation.  . Iodine   . Keflex [Cephalexin] Hives  . Latex Other (See Comments)    Gloves ok  . Prednisone Other (See Comments)    Headache  . Pylera [Bis Subcit-Metronid-Tetracyc] Swelling    Tongue swelling. Face tingling  . Sulfa Antibiotics Other (See Comments)    Increased pulse, fainting, diarrhea, thrush  . Xylocaine [Lidocaine Hcl]     With epinephrine, given by dentist.  Speeded up heart rate and she passed out (occured twice, at dentist)  . Zoloft [Sertraline Hcl] Swelling and Other (See Comments)    Migraine Swelling of tongue/lip (09/2012)  . Advil [Ibuprofen]  Rash    Motrin ok with a GI effect.  . Clarithromycin Rash    Started after completing 10 day course of 2000 mg /day  . Penicillins Hives and Rash    CURRENT MEDICATIONS:  Outpatient Encounter Prescriptions as of 02/10/2015  Medication Sig  . temazepam (RESTORIL) 15 MG capsule Take 15 mg by mouth at bedtime as needed for sleep.  Marland Kitchen acetaminophen (TYLENOL) 650 MG CR tablet Take 650 mg by mouth as needed for pain.  Marland Kitchen ALPRAZolam (XANAX) 0.5 MG tablet Take 0.25 mg by mouth 2 (two) times daily as needed.   . ARTIFICIAL TEAR OP Apply 1-2 drops to eye 4 (four) times daily as needed (dry eyes).   Marland Kitchen b complex vitamins capsule Take 1 capsule by mouth  daily.   . Biotin 1000 MCG tablet Take 1,000 mcg by mouth daily.  . cholecalciferol (VITAMIN D) 1000 UNITS tablet Take 1,000 Units by mouth 2 (two) times daily.   . Loperamide HCl (IMODIUM PO) Take 1 tablet by mouth as needed.  . Magnesium 250 MG TABS Take 1 tablet by mouth daily.  . polyethylene glycol (MIRALAX / GLYCOLAX) packet Take 17 g by mouth as needed.   . ranitidine (ZANTAC) 75 MG tablet Take 75 mg by mouth 2 (two) times daily.  Marland Kitchen SYNTHROID 50 MCG tablet Take 0.5-1 tablets (25-50 mcg total) by mouth daily before breakfast. Take half tab M, W, F, full tab T, Th ,Sat and Sun  . vitamin E 400 UNIT capsule Take 400 Units by mouth every Monday, Wednesday, and Friday. 3 times weekly   No facility-administered encounter medications on file as of 02/10/2015.    PAST MEDICAL HISTORY:   Past Medical History  Diagnosis Date  . Depression     treated in the past for years;stopped in 2010 for a years  . Panic attack   . Claustrophobia   . Fibromyalgia   . IBS (irritable bowel syndrome)     Dr. Benson Norway  . Hypercholesteremia     with high HDL  . Glaucoma, narrow-angle     s/p laser surgery  . Duodenal ulcer 1962    h/o  . Hyperthyroidism     h/o x 2 years  . Shingles 1999    h/o  . Hypothyroid 9/08  . GERD (gastroesophageal reflux disease)   . Trochanteric bursitis 12/2008    bilateral  . Superficial thrombophlebitis 03/2009    RLE  . Bell's palsy 1966    ? right side facial droop  . Osteoporosis 10/11    Dr.Hawkes  . SLE (systemic lupus erythematosus)     with arthralgia/myalgia, positive ANA centromere pattern, positive DNA, positive RNP, indeterminant anti-cardiolipin antibody-Dr.Hawkes.  . Carotid artery disease 2010    on vascular screening;unchanged 2013.(could not tolerate simvastatin, no other statins tried)--<30% blockage bilat 07/2011  . Recurrent UTI     has cystocele-Dr.Grewal  . Chronic fatigue and malaise   . Chronic abdominal pain   . 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 Ocular migraine     PAST SURGICAL HISTORY:   Past Surgical History  Procedure Laterality Date  . Tonsillectomy  1946  . Vaginal hysterectomy  1971    and bladder repair.  Still has ovaries  . Cataract extraction, bilateral  1995, 1996  . Thyroidectomy, partial  09/2005    L nodule; Dr. Harlow Asa  . Upper gi endoscopy  06/27/12  . Flexible sigmoidoscopy  SOCIAL HISTORY:   History   Social History  . Marital Status: Married    Spouse Name: N/A  . Number of Children: 2  . Years of Education: N/A   Occupational History  . retired (school system)    Social History Main Topics  . Smoking status: Never Smoker   . Smokeless tobacco: Never Used  . Alcohol Use: No  . Drug Use: No  . Sexual Activity: No   Other Topics Concern  . Not on file   Social History Narrative   Married.  Son lives in [place]; Daughter [name] lives in [place]; 2 grandchildren    FAMILY HISTORY:   Family Status  Relation Status Death Age  . Mother Deceased 7    MI  . Father Deceased 7    arteriosclerosis  . Sister Alive     2, alive and well  . Brother Deceased 33    "natural causes"--had PTSD  . Daughter Alive   . Son Alive   . Brother Deceased     CAD  . Brother Deceased     lung CA    ROS:  A complete 10 system review of systems was obtained and was unremarkable apart from what is mentioned above.  PHYSICAL EXAMINATION:    VITALS:   Filed Vitals:   02/10/15 1243  BP: 140/70  Pulse: 80  Resp: 20  Height: 4\' 11"  (1.499 m)  Weight: 111 lb 1.6 oz (50.395 kg)  SpO2: 98%    GEN:  The patient appears stated age and is in NAD. HEENT:  Normocephalic, atraumatic.  The mucous membranes are moist. The superficial temporal arteries are without ropiness or tenderness. CV:  RRR Lungs:  CTAB Neck/HEME:  There are no carotid bruits bilaterally.  Neurological examination:  Orientation: The patient is alert and oriented x3. Fund of  knowledge is appropriate.  Recent and remote memory are intact.  Attention and concentration are normal.    Able to name objects and repeat phrases. Cranial nerves: There is good facial symmetry. Pupils are pinpoint and reactive to light bilaterally. Fundoscopic exam reveals clear margins bilaterally. Extraocular muscles are intact. The visual fields are full to confrontational testing. The speech is fluent and clear. Soft palate rises symmetrically and there is no tongue deviation. Hearing is intact to conversational tone. Sensation: Sensation is intact to light and pinprick throughout (facial, trunk, extremities). Vibration is intact at the bilateral big toe. There is no extinction with double simultaneous stimulation. There is no sensory dermatomal level identified. Motor: Strength is 5/5 in the bilateral upper and lower extremities.   Shoulder shrug is equal and symmetric.  There is no pronator drift. Deep tendon reflexes: Deep tendon reflexes are 2/4 at the bilateral biceps, triceps, brachioradialis, patella and trace at the bilateral achilles. Plantar responses are downgoing bilaterally.  Movement examination: Tone: There is slight increased tone in the RUE, most notable with distraction procedures.  Tone elsewhere is normal.   Abnormal movements: There is a near constant RUE resting tremor that increases with distraction procedures.  Coordination:  There is mild decremation with RAM's, seen with finger taps, alternation of supination/pronation of the forearm on the right and hand opening and closing on the right.  RAM's were good on the L and in the bilateral LE's.   Gait and Station: The patient has no difficulty arising out of a deep-seated chair without the use of the hands. The patient's stride length is decreased and she drags the RLE (blames it on  R knee pain).    ASSESSMENT/PLAN:  1.  Parkinsonism.  I suspect that this does represent early idiopathic Parkinson's disease.  The patient has  tremor, bradykinesia, rigidity and mild postural instability.  -We discussed the diagnosis as well as pathophysiology of the disease.  We discussed treatment options as well as prognostic indicators.  Patient education was provided.  -Greater than 50% of the 60 minute visit was spent in counseling answering questions and talking about what to expect now as well as in the future.  We talked about medication options as well as potential future surgical options.  We talked about safety in the home.  -We decided to add carbidopa/levodopa 25/100.  1/2 tab tid x 1 wk, then 1/2 in am & noon & 1 at night for a week, then 1/2 in am &1 at noon &night for a week, then 1 po tid.  Risks, benefits, side effects and alternative therapies were discussed.  The opportunity to ask questions was given and they were answered to the best of my ability.  The patient expressed understanding and willingness to follow the outlined treatment protocols.  -I will refer the patient to the Parkinson's program [facility], for PT/OT and ST.  We talked about the importance of safe, cardiovascular exercise in Parkinson's disease.    -We discussed community resources in the area including patient support groups and community exercise programs for PD and pt education was provided to the patient. 2.  Follow up is anticipated in the next few months, sooner should new neurologic issues arise.

## 2015-02-10 NOTE — Addendum Note (Signed)
Addended byAnnamaria Helling on: 02/10/2015 01:55 PM   Modules accepted: Orders

## 2015-02-11 ENCOUNTER — Telehealth: Payer: Self-pay | Admitting: Neurology

## 2015-02-11 ENCOUNTER — Telehealth: Payer: Self-pay | Admitting: Family Medicine

## 2015-02-11 NOTE — Telephone Encounter (Signed)
[facility] for referral for second opinion.  I do believe that she saw a movement disorder specialist, and I trust her opinion, but I'm happy to let her have a second opinion

## 2015-02-11 NOTE — Telephone Encounter (Signed)
Went over your recommendations with patient. She is EXTREMELY hesitant to start these meds-wonders if she "really" needs them. States that she just has "gut feeling," if you know what she means. She really wants a second opinion and is requesting to be referred to [facility]. Please advise, thanks.

## 2015-02-11 NOTE — Telephone Encounter (Signed)
Pt wants a call to let her know if we called in the medication carbidopa levodopa to the [facility] on [address] [phone]

## 2015-02-11 NOTE — Telephone Encounter (Signed)
Pt states she saw Dr Tat yesterday and was Dx'd with early stages of Parkinson's. Dr Tat wants pt to start taking Carbidopa Levdopa morning, lunch and dinner. Pt wants to know if this will affect her Synthroid med or her thyroid in any way. Pt also state that Dr. Carles Collet recommended that pt start cardiovascular exercises and pt concerned that she can not do this due to age limitations. She hs tried it before and could not do it. Also does Dr Tomi Bamberger think pt should get a second opinion?

## 2015-02-11 NOTE — Telephone Encounter (Signed)
It shouldn't affect her synthroid.  She might want to start out a little slower, knowing how she is affected by medications (maybe start at twice daily just the first day, and then three times the next day if not having any problems).  I recommend EVERY person gets regular aerobic exercise.  She needs to start out very slowly, and increase as tolerated, doing something that doesn't cause her problems or pain.  She can march in front of the TV for 10-15 minutes at a time, and work up to doing that two to three times a day, with a goal of getting 150 minutes/week of aerobic exercise (which can be in 10-15 minute intervals, if needed).  Don't do anything too drastic or too much too quickly, or your fibromyalgia will flare.  This amount of regular exercise is recommended to prevent heart disease (and has no age limit, but the TYPE of exercise, and duration/schedule, has to be adjusted for each person).

## 2015-02-11 NOTE — Telephone Encounter (Signed)
Spoke with patient and she wants to do some research and call me back with the specific doctor that she would like to see.

## 2015-02-11 NOTE — Telephone Encounter (Signed)
Patient aware this was sent yesterday.

## 2015-02-15 ENCOUNTER — Ambulatory Visit: Payer: Medicare Other

## 2015-02-15 ENCOUNTER — Other Ambulatory Visit: Payer: Self-pay | Admitting: *Deleted

## 2015-02-15 ENCOUNTER — Ambulatory Visit: Payer: Medicare Other | Attending: Rheumatology

## 2015-02-15 DIAGNOSIS — M25661 Stiffness of right knee, not elsewhere classified: Secondary | ICD-10-CM | POA: Diagnosis not present

## 2015-02-15 DIAGNOSIS — M7631 Iliotibial band syndrome, right leg: Secondary | ICD-10-CM | POA: Diagnosis not present

## 2015-02-15 DIAGNOSIS — M25561 Pain in right knee: Secondary | ICD-10-CM

## 2015-02-15 DIAGNOSIS — R251 Tremor, unspecified: Secondary | ICD-10-CM

## 2015-02-15 DIAGNOSIS — R5382 Chronic fatigue, unspecified: Secondary | ICD-10-CM

## 2015-02-15 NOTE — Therapy (Signed)
[facility] [address] [place], [place], [address] Phone: [phone]   Fax:  [phone]  Physical Therapy Evaluation  Patient Details  Name: Tracy Malone MRN: [id number] Date of Birth: April 18, 1932 Referring Provider:  Gavin Pound, MD  Encounter Date: 02/15/2015      PT End of Session - 02/15/15 1233    Visit Number 1   Number of Visits 10   Date for PT Re-Evaluation 04/12/15   PT Start Time 4010   PT Stop Time 1117   PT Time Calculation (min) 49 min   Activity Tolerance Patient tolerated treatment well   Behavior During Therapy [facility] for tasks assessed/performed      Past Medical History  Diagnosis Date  . Depression     treated in the past for years;stopped in 2010 for a years  . Panic attack   . Claustrophobia   . Fibromyalgia   . IBS (irritable bowel syndrome)     Dr. Benson Norway  . Glaucoma, narrow-angle     s/p laser surgery  . Duodenal ulcer 1962    h/o  . Hyperthyroidism     h/o x 2 years  . Shingles 1999    h/o  . Hypothyroid 9/08  . GERD (gastroesophageal reflux disease)   . Trochanteric bursitis 12/2008    bilateral  . Superficial thrombophlebitis 03/2009    RLE  . Bell's palsy 1966    ? right side facial droop  . Osteoporosis 10/11    Dr.Hawkes  . SLE (systemic lupus erythematosus)     with arthralgia/myalgia, positive ANA centromere pattern, positive DNA, positive RNP, indeterminant anti-cardiolipin antibody-Dr.Hawkes.  . Carotid artery disease 2010    on vascular screening;unchanged 2013.(could not tolerate simvastatin, no other statins tried)--<30% blockage bilat 07/2011  . Recurrent UTI     has cystocele-Dr.Grewal  . Chronic fatigue and malaise   . Chronic abdominal pain   . Frequent PVCs 07/2012    Seen by [facility]: benign, asymptomatic, normal EF  . Ocular migraine     Past Surgical History  Procedure Laterality Date  . Tonsillectomy  1946  . Vaginal hysterectomy  1971     and bladder repair.  Still has ovaries  . Cataract extraction, bilateral  1995, 1996  . Thyroidectomy, partial  09/2005    L nodule; Dr. Harlow Asa  . Upper gi endoscopy  06/27/12  . Flexible sigmoidoscopy    . Abdominal hysterectomy      There were no vitals filed for this visit.  Visit Diagnosis:  Right anterior knee pain - Plan: PT plan of care cert/re-cert  Knee stiffness, right - Plan: PT plan of care cert/re-cert  Iliotibial band syndrome, right - Plan: PT plan of care cert/re-cert      Subjective Assessment - 02/15/15 1033    Subjective 6 mo. ago, she was kneeling into a pillow and reaching for something else and then the Rt knee got pinched into the button hole area and the knee was sore 2 days after that and felt twisted.  Knee was swollen initially and has continued to be swollen throughout the day. Marland Kitchen Has received cortisone shots in the knee and was only temporary    How long can you sit comfortably? 25 - 30 minutes    How long can you stand comfortably? No issues    How long can you walk comfortably? 15-20 minutes; knee begins to feel tight and feels swelling    Diagnostic tests MRI -  normal - see imaging    Patient Stated Goals reduce pain and be able to straighten out leg without pain, getting in/out of the car, sitting for 45 minutes, walking for 30 minutes at a time    Currently in Pain? Yes  Not currently in pain    Pain Score 5    Pain Location Knee   Pain Orientation Right   Pain Descriptors / Indicators Tightness   Pain Radiating Towards tightness around the calf muscle and pulls in the hamstring muscle, swelling at the knee occurs    Aggravating Factors  Sitting for long periods of time, bent knee for long periods    Pain Relieving Factors Extending leg, ice, Tylenol as needed             [facility] PT Assessment - 02/15/15 0001    Assessment   Medical Diagnosis Rt Knee pain    Onset Date/Surgical Date 08/17/14   Next MD Visit Janurary 2016    Prior Therapy  Previously had pelvic floor PT    Precautions   Precautions Other (comment)  osteoporosis   Restrictions   Weight Bearing Restrictions No   Balance Screen   Has the patient fallen in the past 6 months No   Has the patient had a decrease in activity level because of a fear of falling?  No   Is the patient reluctant to leave their home because of a fear of falling?  No   Home Ecologist residence   Home Access Stairs to enter   Home Layout Two level  17 steps    Prior Function   Level of Independence Independent with basic ADLs   Vocation Retired   Clinical research associate, Building services engineer puzzles, crafts    Cognition   Overall Cognitive Status Within Functional Limits for tasks assessed   Observation/Other Assessments   Focus on Therapeutic Outcomes (FOTO)  64%   Observation/Other Assessments-Edema    Edema Circumferential  13 in on Lt.; 14 in on Rt.    ROM / Strength   AROM / PROM / Strength AROM;PROM;Strength   AROM   Overall AROM  Deficits   AROM Assessment Site Knee   Right/Left Knee Right;Left   Right Knee Extension 15   Right Knee Flexion 120  painful   Left Knee Extension 0   Left Knee Flexion --  WNL    PROM   Overall PROM  Deficits   Overall PROM Comments Passive rt. knee extension/flexion limited by pain    Strength   Overall Strength Deficits   Strength Assessment Site Ankle   Right/Left Hip Right;Left   Right Hip Flexion 4+/5   Left Hip Flexion 4+/5   Right/Left Knee Right;Left   Right Knee Flexion 3-/5   Right Knee Extension 4+/5   Left Knee Flexion 4+/5   Left Knee Extension 4+/5   Right/Left Ankle Right;Left   Right Ankle Dorsiflexion 4+/5   Left Ankle Dorsiflexion 4+/5   Palpation   Patella mobility WNL    Palpation comment 25% limited in SLR Rt. compared to Lt.; increased tenderness at Rt. hamstring insertion and gastroc origin   Tenderness at anterolateral knee joint and ITB    Special Tests    Special Tests --  (+) Noble  compression test   Ambulation/Gait   Ambulation/Gait Yes   Ambulation/Gait Assistance 7: Independent   Ambulation Distance (Feet) 40 Feet   Gait Pattern Decreased stance time - right;Decreased hip/knee flexion - right;Antalgic;Poor foot clearance -  right   Gait Comments --                           PT Education - 02/15/15 1232    Education provided Yes   Education Details HEP: hamstring stretch and supine gastroc stretch    Person(s) Educated Patient   Methods Explanation;Demonstration;Verbal cues   Comprehension Verbalized understanding;Returned demonstration          PT Short Term Goals - 02/15/15 1321    PT SHORT TERM GOAL #1   Title Independent with HEP    Time 4   Period Weeks   Status New   PT SHORT TERM GOAL #2   Title Report 30% decrease in Rt. knee pain with sitting and knitting activities    Time 4   Period Weeks   Status New   PT SHORT TERM GOAL #3   Title Report 30% decrease in Rt knee pain with getting in and out of car    Time 4   PT SHORT TERM GOAL #4   Title Improve AROM knee ROM to -10 degrees on Rt. knee for improved gait mechanics    Time 4   Period Weeks   Status New   PT SHORT TERM GOAL #5   Title --           PT Long Term Goals - 02/15/15 1243    PT LONG TERM GOAL #1   Title Independent with advanced HEP    Time 8   Status New   PT LONG TERM GOAL #2   Title Improve Rt. hamstring strength to 4+/5 to improve walking ability    Time 8   Period Weeks   Status New   PT LONG TERM GOAL #3   Title Increase walking time to 45 minutes without being limited by Rt. knee pain    Time 8   Period Weeks   Status New   PT LONG TERM GOAL #4   Title Improve AROM to > or = -5 degrees to improve gait mechanics    Time 8   Period Weeks   Status New   PT LONG TERM GOAL #5   Title Report 70% improvement in Rt. knee pain with sitting activities    Time 8   Period Weeks   Additional Long Term Goals   Additional Long Term Goals  Yes   PT LONG TERM GOAL #6   Title Improve FOTO score limitation to < or = 49%    Time 8   Period Weeks   Status New               Plan - 02/15/15 1234    Clinical Impression Statement 81 y.o female presents with Rt. sided knee pain that began 6 months when she was kneeling on a pillow and her knee slipped. Has noticed increased swelling, pain and inability to fully extend knee since January and limited ability to do sitting for leisure activities and walking. Limited ROM, increased hamstring, gastroc and  ITB tightness  contributing to pain and decreased extension ROM and hamstring weakness. Pt will benefit from skiled PT for pain management, flexiblity program and LE strenghtening to return to functional activities.      Pt will benefit from skilled therapeutic intervention in order to improve on the following deficits Abnormal gait;Pain;Increased muscle spasms;Decreased activity tolerance;Decreased endurance;Decreased strength;Difficulty walking;Impaired flexibility   Rehab Potential Good   PT Frequency 2x / week  PT Duration 8 weeks   PT Treatment/Interventions ADLs/Self Care Home Management;Cryotherapy;Electrical Stimulation;Gait training;Ultrasound;Moist Heat;Iontophoresis 4mg /ml Dexamethasone;Stair training;Therapeutic activities;Functional mobility training;Therapeutic exercise;Neuromuscular re-education;Manual techniques;Patient/family education;Orthotic Fit/Training;Passive range of motion;Dry needling   PT Next Visit Plan Begin with bicycle, [place] for hamstring muscle/gastroc muslce, review HEP, hip strengthening, ITB stretch/foam rolling    Consulted and Agree with Plan of Care Patient          G-Codes - 2015/02/23 1054    Functional Assessment Tool Used 64% limitation: FOTO   Functional Limitation Mobility: Walking and moving around   Mobility: Walking and Moving Around Current Status [phone]) At least 60 percent but less than 80 percent impaired, limited or restricted    Mobility: Walking and Moving Around Goal Status [phone]) At least 40 percent but less than 60 percent impaired, limited or restricted       Problem List Patient Active Problem List   Diagnosis Date Noted  . Resting tremor 02/01/2015  . Epigastric fullness 10/12/2014  . Early satiety 10/12/2014  . Cystocele 12/30/2013  . IBS (irritable bowel syndrome) 09/30/2013  . GERD (gastroesophageal reflux disease) 06/27/2013  . PVC's (premature ventricular contractions) 10/02/2012  . Depressive disorder, not elsewhere classified 10/02/2012  . H. pylori infection 08/01/2012  . Bradycardia 08/01/2012  . Fatigue 08/01/2012  . Anxiety state, unspecified 05/13/2012  . Osteopenia 05/13/2012  . Hypothyroidism 05/13/2012   Reginal Lutes, SPT Feb 23, 2015 1:26 PM   During this treatment session, the therapist was present, participating in, and directing the treatment. TAKACS,KELLY, PT February 23, 2015, 1:26 PM  [facility] [address] [place], [place], [address] Phone: [phone]   Fax:  [phone]

## 2015-02-15 NOTE — Patient Instructions (Addendum)
HIP: Hamstrings - Short Sitting   Rest leg on raised surface. Keep knee straight. Lift chest. Hold _20__ seconds. Alternate legs and do 3x each leg. Do 4-5 x a day   Hamstring Step 1   Straighten left knee using a belt/long towel/bed sheet rolled up. Keep knee level with other knee or on bolster.  Hold _20 seconds__ seconds. Relax knee by returning foot to start. Repeat _3x__ times each leg. Do 3-4x a day.    [facility] [address], [place],  [address] Phone # [phone] Fax [phone]

## 2015-02-17 ENCOUNTER — Encounter: Payer: Self-pay | Admitting: Family Medicine

## 2015-02-22 ENCOUNTER — Ambulatory Visit: Payer: Medicare Other | Attending: Rheumatology | Admitting: Physical Therapy

## 2015-02-22 DIAGNOSIS — M79604 Pain in right leg: Secondary | ICD-10-CM | POA: Insufficient documentation

## 2015-02-22 DIAGNOSIS — M7631 Iliotibial band syndrome, right leg: Secondary | ICD-10-CM | POA: Insufficient documentation

## 2015-02-22 DIAGNOSIS — M79605 Pain in left leg: Secondary | ICD-10-CM | POA: Diagnosis not present

## 2015-02-22 DIAGNOSIS — M25561 Pain in right knee: Secondary | ICD-10-CM

## 2015-02-22 DIAGNOSIS — M25661 Stiffness of right knee, not elsewhere classified: Secondary | ICD-10-CM | POA: Insufficient documentation

## 2015-02-22 NOTE — Therapy (Signed)
[facility] [address] [place], [place], [address] Phone: [phone]   Fax:  [phone]  Physical Therapy Treatment  Patient Details  Name: Tracy Malone MRN: [id number] Date of Birth: 1931-09-18 Referring Provider:  Zenovia Jordan, MD  Encounter Date: 02/22/2015      PT End of Session - 02/22/15 1309    Visit Number 2   Number of Visits 10   Date for PT Re-Evaluation 04/12/15   PT Start Time 1230   PT Stop Time 1310   PT Time Calculation (min) 40 min   Activity Tolerance Patient tolerated treatment well   Behavior During Therapy [facility] for tasks assessed/performed      Past Medical History  Diagnosis Date  . Depression     treated in the past for years;stopped in 2010 for a years  . Panic attack   . Claustrophobia   . Fibromyalgia   . IBS (irritable bowel syndrome)     Dr. Elnoria Howard  . Glaucoma, narrow-angle     s/p laser surgery  . Duodenal ulcer 1962    h/o  . Hyperthyroidism     h/o x 2 years  . Shingles 1999    h/o  . Hypothyroid 9/08  . GERD (gastroesophageal reflux disease)   . Trochanteric bursitis 12/2008    bilateral  . Superficial thrombophlebitis 03/2009    RLE  . Bell's palsy 1966    ? right side facial droop  . Osteoporosis 10/11    Dr.Hawkes  . SLE (systemic lupus erythematosus)     with arthralgia/myalgia, positive ANA centromere pattern, positive DNA, positive RNP, indeterminant anti-cardiolipin antibody-Dr.Hawkes.  . Carotid artery disease 2010    on vascular screening;unchanged 2013.(could not tolerate simvastatin, no other statins tried)--<30% blockage bilat 07/2011  . Recurrent UTI     has cystocele-Dr.Grewal  . Chronic fatigue and malaise   . Chronic abdominal pain   . 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 Ocular migraine     Past Surgical History  Procedure Laterality Date  . Tonsillectomy  1946  . Vaginal hysterectomy  1971     and bladder repair.  Still has ovaries  . Cataract extraction, bilateral  1995, 1996  . Thyroidectomy, partial  09/2005    L nodule; Dr. Gerrit Friends  . Upper gi endoscopy  06/27/12  . Flexible sigmoidoscopy    . Abdominal hysterectomy      There were no vitals filed for this visit.  Visit Diagnosis:  Right anterior knee pain  Iliotibial band syndrome, right  Knee stiffness, right      Subjective Assessment - 02/22/15 1233    Subjective I cannot straighten my knee when I walk bc of pain.    Currently in Pain? No/denies   Aggravating Factors  If I straighten my knee when walking.   Pain Relieving Factors Ice,meds   Multiple Pain Sites No                         [facility] Adult PT Treatment/Exercise - 02/22/15 0001    Knee/Hip Exercises: Stretches   Active Hamstring Stretch Right;Both;3 reps;20 seconds   Gastroc Stretch Right;3 reps;20 seconds   Ultrasound   Ultrasound Location RT lateral quad   Ultrasound Parameters 100%, 1.2wtcm2    Ultrasound Goals Pain   Manual Therapy   Manual Therapy Soft tissue mobilization   Soft tissue mobilization RT gastroc,  IT BAnd, hamstring, quad                  PT Short Term Goals - 02/22/15 1313    PT SHORT TERM GOAL #1   Title Independent with HEP    Time 4   Period Weeks   Status Achieved   PT SHORT TERM GOAL #2   Title Report 30% decrease in Rt. knee pain with sitting and knitting activities    Time 4   Period Weeks   Status On-going  Too soon   PT SHORT TERM GOAL #3   Title Report 30% decrease in Rt knee pain with getting in and out of car    Time 4   Period Weeks   Status On-going  Too soon           PT Long Term Goals - 02/15/15 1243    PT LONG TERM GOAL #1   Title Independent with advanced HEP    Time 8   Status New   PT LONG TERM GOAL #2   Title Improve Rt. hamstring strength to 4+/5 to improve walking ability    Time 8   Period Weeks   Status New   PT LONG TERM GOAL #3   Title  Increase walking time to 45 minutes without being limited by Rt. knee pain    Time 8   Period Weeks   Status New   PT LONG TERM GOAL #4   Title Improve AROM to > or = -5 degrees to improve gait mechanics    Time 8   Period Weeks   Status New   PT LONG TERM GOAL #5   Title Report 70% improvement in Rt. knee pain with sitting activities    Time 8   Period Weeks   Additional Long Term Goals   Additional Long Term Goals Yes   PT LONG TERM GOAL #6   Title Improve FOTO score limitation to < or = 49%    Time 8   Period Weeks   Status New               Plan - 02/22/15 1311    Clinical Impression Statement PAin persists. Pt fearful of stretching bc she feels pain and is afraid it will worsen. encouraged pt to stretch gently 3-4xday and doing so should looosen the tissues and eventualy reduce pain. Pt received [place] and manual work today. After this she was able to walk without pain during stance phase.    Pt will benefit from skilled therapeutic intervention in order to improve on the following deficits Abnormal gait;Pain;Increased muscle spasms;Decreased activity tolerance;Decreased endurance;Decreased strength;Difficulty walking;Impaired flexibility   Rehab Potential Good   Clinical Impairments Affecting Rehab Potential None   PT Frequency 2x / week   PT Duration 8 weeks   PT Treatment/Interventions ADLs/Self Care Home Management;Cryotherapy;Electrical Stimulation;Gait training;Ultrasound;Moist Heat;Iontophoresis 4mg /ml Dexamethasone;Stair training;Therapeutic activities;Functional mobility training;Therapeutic exercise;Neuromuscular re-education;Manual techniques;Patient/family education;Orthotic Fit/Training;Passive range of motion;Dry needling   PT Next Visit Plan Nu Step, manual and [place] to knee area, continue stretching, try rocker board standing   Consulted and Agree with Plan of Care Patient        Problem List Patient Active Problem List   Diagnosis Date Noted  . Resting  tremor 02/01/2015  . Epigastric fullness 10/12/2014  . Early satiety 10/12/2014  . Cystocele 12/30/2013  . IBS (irritable bowel syndrome) 09/30/2013  . GERD (gastroesophageal reflux disease) 06/27/2013  . PVC's (premature ventricular contractions) 10/02/2012  .  Depressive disorder, not elsewhere classified 10/02/2012  . H. pylori infection 08/01/2012  . Bradycardia 08/01/2012  . Fatigue 08/01/2012  . Anxiety state, unspecified 05/13/2012  . Osteopenia 05/13/2012  . Hypothyroidism 05/13/2012    Almadelia Looman, PTA 02/22/2015, 1:15 PM  [facility] [address] [place], [place], [address] Phone: [phone]   Fax:  [phone]    Nu Step x 4 min L1 was performed

## 2015-02-22 NOTE — Patient Instructions (Signed)
Walk around in the house working on straightnening RT leg, visualizing the knee opening in the back. Do a few times a day. Pt verbally agreed.

## 2015-02-23 ENCOUNTER — Encounter: Payer: Self-pay | Admitting: Family Medicine

## 2015-02-23 ENCOUNTER — Ambulatory Visit (INDEPENDENT_AMBULATORY_CARE_PROVIDER_SITE_OTHER): Payer: Medicare Other | Admitting: Family Medicine

## 2015-02-23 VITALS — BP 116/70 | Wt 111.0 lb

## 2015-02-23 DIAGNOSIS — K120 Recurrent oral aphthae: Secondary | ICD-10-CM | POA: Diagnosis not present

## 2015-02-23 MED ORDER — TRIAMCINOLONE ACETONIDE 0.1 % MT PSTE: PASTE | OROMUCOSAL | Status: DC

## 2015-02-23 NOTE — Patient Instructions (Signed)
Use the Kenalog and if that doesn't work call and we will refer you to an ENT

## 2015-02-23 NOTE — Progress Notes (Signed)
   Subjective:    Patient ID: Tracy Malone, female    DOB: 1932-01-10, 81 y.o.   MRN: [id number]  HPI Approximately 3 weeks ago she had difficulty with a sore throat and points to the upper neck area. She then noted a sore lesion on the left side of her tongue. She's had no fever, chills, cough or congestion. She has had difficulty with aphthous ulcers in the past.   Review of Systems     Objective:   Physical Exam Alert and in no distress. Throat is clear. Neck is supple without adenopathy. TMs normal. Exam of the tongue does show an ulcerated lesion on the left side of the tongue.       Assessment & Plan:  Aphthous ulcer  recommend using Kenalog in [employer] . If no improvement she will call for possible referral to ENT.

## 2015-02-24 ENCOUNTER — Ambulatory Visit (INDEPENDENT_AMBULATORY_CARE_PROVIDER_SITE_OTHER): Payer: Medicare Other | Admitting: Licensed Clinical Social Worker

## 2015-02-24 ENCOUNTER — Encounter: Payer: Self-pay | Admitting: Physical Therapy

## 2015-02-24 ENCOUNTER — Ambulatory Visit: Payer: Medicare Other | Admitting: Physical Therapy

## 2015-02-24 DIAGNOSIS — M7631 Iliotibial band syndrome, right leg: Secondary | ICD-10-CM

## 2015-02-24 DIAGNOSIS — F419 Anxiety disorder, unspecified: Secondary | ICD-10-CM

## 2015-02-24 DIAGNOSIS — M25561 Pain in right knee: Secondary | ICD-10-CM

## 2015-02-24 DIAGNOSIS — M79605 Pain in left leg: Secondary | ICD-10-CM | POA: Diagnosis not present

## 2015-02-24 DIAGNOSIS — M25661 Stiffness of right knee, not elsewhere classified: Secondary | ICD-10-CM | POA: Diagnosis not present

## 2015-02-24 DIAGNOSIS — M79604 Pain in right leg: Secondary | ICD-10-CM | POA: Diagnosis not present

## 2015-02-24 NOTE — Therapy (Signed)
[facility] [address], [address] [place], [place], [address] Phone: [phone]   Fax:  [phone]  Physical Therapy Treatment  Patient Details  Name: Tracy Malone MRN: [id number] Date of Birth: 10/31/1931 Referring Provider:  Hawkes, Angela, MD  Encounter Date: 02/24/2015      PT End of Session - 02/24/15 1615    Visit Number 3   Number of Visits 10  Medicare   Date for PT Re-Evaluation 04/12/15   PT Start Time 1615   PT Stop Time 1655   PT Time Calculation (min) 40 min   Activity Tolerance Patient tolerated treatment well   Behavior During Therapy WFL for tasks assessed/performed      Past Medical History  Diagnosis Date  . Depression     treated in the past for years;stopped in 2010 for a years  . Panic attack   . Claustrophobia   . Fibromyalgia   . IBS (irritable bowel syndrome)     Dr. Hung  . Glaucoma, narrow-angle     s/p laser surgery  . Duodenal ulcer 1962    h/o  . Hyperthyroidism     h/o x 2 years  . Shingles 1999    h/o  . Hypothyroid 9/08  . GERD (gastroesophageal reflux disease)   . Trochanteric bursitis 12/2008    bilateral  . Superficial thrombophlebitis 03/2009    RLE  . Bell's palsy 1966    ? right side facial droop  . Osteoporosis 10/11    Dr.Hawkes  . SLE (systemic lupus erythematosus)     with arthralgia/myalgia, positive ANA centromere pattern, positive DNA, positive RNP, indeterminant anti-cardiolipin antibody-Dr.Hawkes.  . Carotid artery disease 2010    on vascular screening;unchanged 2013.(could not tolerate simvastatin, no other statins tried)--<30% blockage bilat 07/2011  . Recurrent UTI     has cystocele-Dr.Grewal  . Chronic fatigue and malaise   . Chronic abdominal pain   . 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 Ocular migraine     Past Surgical History  Procedure Laterality Date  . Tonsillectomy  1946  . Vaginal  hysterectomy  1971    and bladder repair.  Still has ovaries  . Cataract extraction, bilateral  1995, 1996  . Thyroidectomy, partial  09/2005    L nodule; Dr. Gerkin  . Upper gi endoscopy  06/27/12  . Flexible sigmoidoscopy    . Abdominal hysterectomy      There were no vitals filed for this visit.  Visit Diagnosis:  Right anterior knee pain  Iliotibial band syndrome, right  Knee stiffness, right      Subjective Assessment - 02/24/15 1616    Subjective ultrasound helped. I was able to move more with less pain.    Limitations Sitting   How long can you sit comfortably? 25 - 30 minutes    How long can you stand comfortably? No issues    How long can you walk comfortably? 15-20 minutes; knee begins to feel tight and feels swelling    Diagnostic tests MRI - normal - see imaging    Patient Stated Goals reduce pain and be able to straighten out leg without pain, getting in/out of the car, sitting for 45 minutes, walking for 30 minutes at a time                          [facility] Adult PT Treatment/Exercise - 02/24/15   0001    Knee/Hip Exercises: Aerobic   Nustep level 1 8 min   Knee/Hip Exercises: Standing   Step Down Right;20 reps   Ultrasound   Ultrasound Location right lateral quad   Ultrasound Parameters 100%, 1 mhz, 1.2 w/cm2 8 min   Ultrasound Goals Pain   Manual Therapy   Manual Therapy Soft tissue mobilization   Soft tissue mobilization RT gastroc, IT BAnd, hamstring, quad                PT Education - 02/24/15 1644    Education provided No          PT Short Term Goals - 02/22/15 1313    PT SHORT TERM GOAL #1   Title Independent with HEP    Time 4   Period Weeks   Status Achieved   PT SHORT TERM GOAL #2   Title Report 30% decrease in Rt. knee pain with sitting and knitting activities    Time 4   Period Weeks   Status On-going  Too soon   PT SHORT TERM GOAL #3   Title Report 30% decrease in Rt knee pain with getting in and out of  car    Time 4   Period Weeks   Status On-going  Too soon           PT Long Term Goals - 02/15/15 1243    PT LONG TERM GOAL #1   Title Independent with advanced HEP    Time 8   Status New   PT LONG TERM GOAL #2   Title Improve Rt. hamstring strength to 4+/5 to improve walking ability    Time 8   Period Weeks   Status New   PT LONG TERM GOAL #3   Title Increase walking time to 45 minutes without being limited by Rt. knee pain    Time 8   Period Weeks   Status New   PT LONG TERM GOAL #4   Title Improve AROM to > or = -5 degrees to improve gait mechanics    Time 8   Period Weeks   Status New   PT LONG TERM GOAL #5   Title Report 70% improvement in Rt. knee pain with sitting activities    Time 8   Period Weeks   Additional Long Term Goals   Additional Long Term Goals Yes   PT LONG TERM GOAL #6   Title Improve FOTO score limitation to < or = 49%    Time 8   Period Weeks   Status New               Plan - 02/24/15 1644    Clinical Impression Statement Patient reports she has more movement since she had the ultrasound. Patient has palpable tenderness located in right iliotibial band.  Patient has not met goals due to just starting therapy. Patient has difficulty with walking due to pain. Patient hs decreased movement of right knee. Patient would benefit from physical therapy to improve right knee strength and mobility.    Pt will benefit from skilled therapeutic intervention in order to improve on the following deficits Abnormal gait;Pain;Increased muscle spasms;Decreased activity tolerance;Decreased endurance;Decreased strength;Difficulty walking;Impaired flexibility   Rehab Potential Good   Clinical Impairments Affecting Rehab Potential None   PT Frequency 2x / week   PT Duration 8 weeks   PT Treatment/Interventions ADLs/Self Care Home Management;Cryotherapy;Electrical Stimulation;Gait training;Ultrasound;Moist Heat;Iontophoresis 14m/ml Dexamethasone;Stair  training;Therapeutic activities;Functional mobility training;Therapeutic exercise;Neuromuscular re-education;Manual techniques;Patient/family education;Orthotic Fit/Training;Passive  range of motion;Dry needling   PT Next Visit Plan Nu Step, manual and US to knee area, Knee ROM   PT Home Exercise Plan progress as needed   Consulted and Agree with Plan of Care Patient        Problem List Patient Active Problem List   Diagnosis Date Noted  . Resting tremor 02/01/2015  . Epigastric fullness 10/12/2014  . Early satiety 10/12/2014  . Cystocele 12/30/2013  . IBS (irritable bowel syndrome) 09/30/2013  . GERD (gastroesophageal reflux disease) 06/27/2013  . PVC's (premature ventricular contractions) 10/02/2012  . Depressive disorder, not elsewhere classified 10/02/2012  . H. pylori infection 08/01/2012  . Bradycardia 08/01/2012  . Fatigue 08/01/2012  . Anxiety state, unspecified 05/13/2012  . Osteopenia 05/13/2012  . Hypothyroidism 05/13/2012    GRAY,CHERYL,PT 02/24/2015, 4:57 PM  [facility] [address], [address] [place], [place], [address] Phone: [phone]   Fax:  [phone]      

## 2015-03-01 ENCOUNTER — Encounter: Payer: Self-pay | Admitting: Physical Therapy

## 2015-03-01 ENCOUNTER — Ambulatory Visit: Payer: Medicare Other | Admitting: Physical Therapy

## 2015-03-01 ENCOUNTER — Encounter: Payer: Self-pay | Admitting: Family Medicine

## 2015-03-01 DIAGNOSIS — M25661 Stiffness of right knee, not elsewhere classified: Secondary | ICD-10-CM

## 2015-03-01 DIAGNOSIS — M25561 Pain in right knee: Secondary | ICD-10-CM

## 2015-03-01 DIAGNOSIS — M7631 Iliotibial band syndrome, right leg: Secondary | ICD-10-CM

## 2015-03-01 DIAGNOSIS — M79604 Pain in right leg: Secondary | ICD-10-CM | POA: Diagnosis not present

## 2015-03-01 DIAGNOSIS — M79605 Pain in left leg: Secondary | ICD-10-CM | POA: Diagnosis not present

## 2015-03-01 NOTE — Patient Instructions (Signed)
RE-ALIGNMENT ROUTINE EXERCISES-OSTEOPROROSIS BASIC FOR POSTURAL CORRECTION               4. Leg Lengthener: stretches quadratus lumborum and hip flexors.  Strengthens quads and ankle dorsiflexors.   Straighten one leg. Pull toes AND forefoot toward knee gently , extend heel. Lengthen leg by pulling pelvis away from ribs. Hold _2-3__ seconds. Relax. Repeat 1 time. Re-bend knee. Do other leg. Each leg 4-6___ times. Do 1-2 x day Surface: floor  Copyright  V[employer]. All rights reserved.

## 2015-03-01 NOTE — Therapy (Signed)
[facility] [address] [place], [place], [address] Phone: [phone]   Fax:  [phone]  Physical Therapy Treatment  Patient Details  Name: Tracy Malone MRN: [id number] Date of Birth: 1931-09-17 Referring Provider:  Zenovia Jordan, MD  Encounter Date: 03/01/2015      PT End of Session - 03/01/15 1308    Visit Number 4   Number of Visits 10   Date for PT Re-Evaluation 04/12/15   PT Start Time 1230   PT Stop Time 1310   PT Time Calculation (min) 40 min   Activity Tolerance Patient tolerated treatment well   Behavior During Therapy [facility] for tasks assessed/performed      Past Medical History  Diagnosis Date  . Depression     treated in the past for years;stopped in 2010 for a years  . Panic attack   . Claustrophobia   . Fibromyalgia   . IBS (irritable bowel syndrome)     Dr. Elnoria Howard  . Glaucoma, narrow-angle     s/p laser surgery  . Duodenal ulcer 1962    h/o  . Hyperthyroidism     h/o x 2 years  . Shingles 1999    h/o  . Hypothyroid 9/08  . GERD (gastroesophageal reflux disease)   . Trochanteric bursitis 12/2008    bilateral  . Superficial thrombophlebitis 03/2009    RLE  . Bell's palsy 1966    ? right side facial droop  . Osteoporosis 10/11    Dr.Hawkes  . SLE (systemic lupus erythematosus)     with arthralgia/myalgia, positive ANA centromere pattern, positive DNA, positive RNP, indeterminant anti-cardiolipin antibody-Dr.Hawkes.  . Carotid artery disease 2010    on vascular screening;unchanged 2013.(could not tolerate simvastatin, no other statins tried)--<30% blockage bilat 07/2011  . Recurrent UTI     has cystocele-Dr.Grewal  . Chronic fatigue and malaise   . Chronic abdominal pain   . 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 Ocular migraine     Past Surgical History  Procedure Laterality Date  . Tonsillectomy  1946  . Vaginal hysterectomy  1971     and bladder repair.  Still has ovaries  . Cataract extraction, bilateral  1995, 1996  . Thyroidectomy, partial  09/2005    L nodule; Dr. Gerrit Friends  . Upper gi endoscopy  06/27/12  . Flexible sigmoidoscopy    . Abdominal hysterectomy      There were no vitals filed for this visit.  Visit Diagnosis:  Right anterior knee pain  Iliotibial band syndrome, right  Knee stiffness, right      Subjective Assessment - 03/01/15 1229    Subjective My knee is swollen today, still having trouble straightening out knee.   Currently in Pain? No/denies   Multiple Pain Sites No                         [facility] Adult PT Treatment/Exercise - 03/01/15 0001    Knee/Hip Exercises: Stretches   Other Knee/Hip Stretches Supine leg lengthener 2x3 holding for 3 sec   Knee/Hip Exercises: Aerobic   Nustep L1 x 10 min   Ultrasound   Ultrasound Location RT lateral quad knee   Ultrasound Parameters 100% 1.2wtcm2    Ultrasound Goals Pain   Manual Therapy   Manual Therapy Soft tissue mobilization   Soft tissue mobilization RT gastroc, IT BAnd, hamstring, quad  PT Education - 03/01/15 1301    Education provided Yes   Education Details HEP supine leg lengthener   Person(s) Educated Patient   Methods Explanation;Demonstration;Handout   Comprehension Verbalized understanding;Returned demonstration          PT Short Term Goals - 03/01/15 1312    PT SHORT TERM GOAL #1   Title Independent with HEP    Time 4   Period Weeks   Status Achieved   PT SHORT TERM GOAL #2   Title Report 30% decrease in Rt. knee pain with sitting and knitting activities    Time 4   Period Weeks   Status --  10%   PT SHORT TERM GOAL #3   Title Report 30% decrease in Rt knee pain with getting in and out of car    Time 4   Period Weeks   Status On-going  10%   PT SHORT TERM GOAL #4   Title Improve AROM knee ROM to -10 degrees on Rt. knee for improved gait mechanics    Time 4    Period Weeks   Status On-going  will take at end of week.            PT Long Term Goals - 02/15/15 1243    PT LONG TERM GOAL #1   Title Independent with advanced HEP    Time 8   Status New   PT LONG TERM GOAL #2   Title Improve Rt. hamstring strength to 4+/5 to improve walking ability    Time 8   Period Weeks   Status New   PT LONG TERM GOAL #3   Title Increase walking time to 45 minutes without being limited by Rt. knee pain    Time 8   Period Weeks   Status New   PT LONG TERM GOAL #4   Title Improve AROM to > or = -5 degrees to improve gait mechanics    Time 8   Period Weeks   Status New   PT LONG TERM GOAL #5   Title Report 70% improvement in Rt. knee pain with sitting activities    Time 8   Period Weeks   Additional Long Term Goals   Additional Long Term Goals Yes   PT LONG TERM GOAL #6   Title Improve FOTO score limitation to < or = 49%    Time 8   Period Weeks   Status New               Plan - 03/01/15 1309    Clinical Impression Statement Pt reports having some days where her knee does feel better. She is riding stationary bike at home which she feels is helpful. Last two days she reports knee feels swollen making it difficult to bend knee. Knee does not appear swollen through observation, but she does demonstrate overcontracting the RT quad when she walks, almost like she is protecting it.    Pt will benefit from skilled therapeutic intervention in order to improve on the following deficits Abnormal gait;Pain;Increased muscle spasms;Decreased activity tolerance;Decreased endurance;Decreased strength;Difficulty walking;Impaired flexibility   Rehab Potential Good   Clinical Impairments Affecting Rehab Potential None   PT Frequency 2x / week   PT Duration 8 weeks   PT Treatment/Interventions ADLs/Self Care Home Management;Cryotherapy;Electrical Stimulation;Gait training;Ultrasound;Moist Heat;Iontophoresis 4mg /ml Dexamethasone;Stair training;Therapeutic  activities;Functional mobility training;Therapeutic exercise;Neuromuscular re-education;Manual techniques;Patient/family education;Orthotic Fit/Training;Passive range of motion;Dry needling   PT Next Visit Plan Nu Step, manual and [place] to knee area, Knee ROM,  work on not contracting the quad too much when walking.        Problem List Patient Active Problem List   Diagnosis Date Noted  . Resting tremor 02/01/2015  . Epigastric fullness 10/12/2014  . Early satiety 10/12/2014  . Cystocele 12/30/2013  . IBS (irritable bowel syndrome) 09/30/2013  . GERD (gastroesophageal reflux disease) 06/27/2013  . PVC's (premature ventricular contractions) 10/02/2012  . Depressive disorder, not elsewhere classified 10/02/2012  . H. pylori infection 08/01/2012  . Bradycardia 08/01/2012  . Fatigue 08/01/2012  . Anxiety state, unspecified 05/13/2012  . Osteopenia 05/13/2012  . Hypothyroidism 05/13/2012    Kingsten Enfield, PTA 03/01/2015, 1:14 PM  [facility] [address], [address] [place], [place], [address] Phone: [phone]   Fax:  [phone]

## 2015-03-02 ENCOUNTER — Ambulatory Visit (INDEPENDENT_AMBULATORY_CARE_PROVIDER_SITE_OTHER): Payer: Medicare Other | Admitting: Neurology

## 2015-03-02 ENCOUNTER — Telehealth: Payer: Self-pay | Admitting: Neurology

## 2015-03-02 ENCOUNTER — Encounter: Payer: Self-pay | Admitting: Neurology

## 2015-03-02 VITALS — BP 112/68 | HR 68 | Resp 14 | Ht 59.0 in | Wt 111.0 lb

## 2015-03-02 DIAGNOSIS — M7550 Bursitis of unspecified shoulder: Secondary | ICD-10-CM | POA: Insufficient documentation

## 2015-03-02 DIAGNOSIS — M7551 Bursitis of right shoulder: Secondary | ICD-10-CM

## 2015-03-02 DIAGNOSIS — G250 Essential tremor: Secondary | ICD-10-CM | POA: Diagnosis not present

## 2015-03-02 NOTE — Progress Notes (Signed)
[facility]  PATIENT: Tracy Malone DOB: 1932-03-04  REFERRING DOCTOR OR PCP:  Joselyn Arrow SOURCE: patient and EMR records  _________________________________   HISTORICAL  CHIEF COMPLAINT:  Chief Complaint  Patient presents with  . Tremors    Sherrica is here with her husband Chrissie Noa and her dtr. [name] for eval of tremor right hand first noted in November 2015.  Sts. tremor is gradually worsening and is most noticeable when she is holding a fork.  Sts. prior to tremor, she was pushing a heavy door open with her right arm, had sudden pain in right shoulder--she did not seek tx. for pain and sts. she still has intermittent pain right shoulder with lifting arm past midline.  No pain with rest.  She also c/o difficulty going to and staying asleep,   . Sleep Disturbance    onset yrs. ago.  She is currently taking Restoril 15mg  prn, but sts. it leaves her very drowsy the next day.  Sts. even with Restoril she wakes 2-3 times per night, usually to go to the bathroom.  Her husband sts. she has occasional light snoring.  She sts. she doesn't typically nap during the day.  She denies h/a's./fim    HISTORY OF PRESENT ILLNESS:  I had the pleasure of seeing your patient, [name], at [facility] for a neurologic consultation regarding her tremor.     She is an 81 year old woman who began to notice a right hand tremor in late 2015, after a right arm injury she injured her arm when she was pushing a heavy door open and had sudden pain in the right shoulder and proximal arm.  She notes the tremor only in the right hand. She specifically notes it when she is holding a utensil like a spoon or fork. She takes Xanax for anxiety, 0.25 mg twice a day and finds that her tremor is better for a few hours after each pill. Her tremor will worsen if she asked or some brings attention to the fact that she has a tremor. She has not noted the tremor in any significant way in the  left arm or in the head.   She has not had any difficulty with her gait or balance. There have been no falls.   She has no family history tremors.  She continues to have pain in the right shoulder and upper arm.   She has done physical therapy for that and feels it is a little bit better.  Pain is worse when she tries to raise her arm over her head or externally rotates.   REVIEW OF SYSTEMS: Constitutional: No fevers, chills, sweats, or change in appetite Eyes: No visual changes, double vision, eye pain Ear, nose and throat: No hearing loss, ear pain, nasal congestion, sore throat Cardiovascular: No chest pain, palpitations Respiratory: No shortness of breath at rest or with exertion.   No wheezes GastrointestinaI: No nausea, vomiting, diarrhea, abdominal pain, fecal incontinence Genitourinary: No dysuria, urinary retention or frequency.  No nocturia. Musculoskeletal: No neck pain, back pain Integumentary: No rash, pruritus, skin lesions Neurological: as above Psychiatric: No depression at this time.  No anxiety Endocrine: No palpitations, diaphoresis, change in appetite, change in weigh or increased thirst Hematologic/Lymphatic: No anemia, purpura, petechiae. Allergic/Immunologic: No itchy/runny eyes, nasal congestion, recent allergic reactions, rashes  ALLERGIES: Allergies  Allergen Reactions  . Salmon [Fish Allergy] Hives and Shortness Of Breath  . Shellfish Allergy Anaphylaxis  . Aspirin Other (See Comments)  Sever stomach pain due to ulcer scaring.  . Ciprofloxacin Diarrhea  . Codeine Nausea And Vomiting  . Darvocet [Propoxyphene N-Acetaminophen] Nausea And Vomiting  . Demerol [Meperidine] Nausea And Vomiting    Violent vomitting  . Diphedryl [Diphenhydramine] Other (See Comments)    Increased pulse/small amount ok  . Doxycycline Hyclate Other (See Comments)    GI intolerance.  Marland Kitchen Epinephrine Other (See Comments)    Breathing problems  . Erythromycin Other (See  Comments)    GI intolerance.  [name] [Cyclobenzaprine] Other (See Comments)    Tingly/prickly sensation.  . Iodine   . Keflex [Cephalexin] Hives  . Latex Other (See Comments)    Gloves ok  . Prednisone Other (See Comments)    Headache  . Pylera [Bis Subcit-Metronid-Tetracyc] Swelling    Tongue swelling. Face tingling  . Sulfa Antibiotics Other (See Comments)    Increased pulse, fainting, diarrhea, thrush  . Xylocaine [Lidocaine Hcl]     With epinephrine, given by dentist.  Speeded up heart rate and she passed out (occured twice, at dentist)  . Zoloft [Sertraline Hcl] Swelling and Other (See Comments)    Migraine Swelling of tongue/lip (09/2012)  . Advil [Ibuprofen] Rash    Motrin ok with a GI effect.  . Clarithromycin Rash    Started after completing 10 day course of 2000 mg /day  . Penicillins Hives and Rash    HOME MEDICATIONS:  Current outpatient prescriptions:  .  acetaminophen (TYLENOL) 650 MG CR tablet, Take 650 mg by mouth as needed for pain., Disp: , Rfl:  .  ALPRAZolam (XANAX) 0.5 MG tablet, Take 0.25 mg by mouth 2 (two) times daily as needed. , Disp: , Rfl:  .  ARTIFICIAL TEAR OP, Apply 1-2 drops to eye 4 (four) times daily as needed (dry eyes). , Disp: , Rfl:  .  b complex vitamins capsule, Take 1 capsule by mouth daily. , Disp: , Rfl:  .  Biotin 1000 MCG tablet, Take 1,000 mcg by mouth daily., Disp: , Rfl:  .  cholecalciferol (VITAMIN D) 1000 UNITS tablet, Take 1,000 Units by mouth 2 (two) times daily. , Disp: , Rfl:  .  feeding supplement (BOOST HIGH PROTEIN) LIQD, Take 1 Container by mouth 3 (three) times daily between meals., Disp: , Rfl:  .  Loperamide HCl (IMODIUM PO), Take 1 tablet by mouth as needed., Disp: , Rfl:  .  SYNTHROID 50 MCG tablet, Take 0.5-1 tablets (25-50 mcg total) by mouth daily before breakfast. Take half tab M, W, F, full tab T, Th ,Sat and Sun, Disp: 90 tablet, Rfl: 1 .  temazepam (RESTORIL) 15 MG capsule, Take 15 mg by mouth at bedtime as  needed for sleep., Disp: , Rfl:  .  triamcinolone (KENALOG) 0.1 % paste, 1 application to the affected area 2 or 3 times per day, Disp: 5 g, Rfl: 0 .  vitamin E 400 UNIT capsule, Take 400 Units by mouth every Monday, Wednesday, and Friday. 3 times weekly, Disp: , Rfl:  .  carbidopa-levodopa (SINEMET IR) 25-100 MG per tablet, Take 1 tablet by mouth 3 (three) times daily. (Patient not taking: Reported on 02/23/2015), Disp: 270 tablet, Rfl: 1 .  Lysine 500 MG CAPS, Take by mouth., Disp: , Rfl:  .  Magnesium 250 MG TABS, Take 1 tablet by mouth daily., Disp: , Rfl:  .  polyethylene glycol (MIRALAX / GLYCOLAX) packet, Take 17 g by mouth as needed. , Disp: , Rfl:  .  ranitidine (ZANTAC) 75 MG  tablet, Take 75 mg by mouth 2 (two) times daily., Disp: , Rfl:   PAST MEDICAL HISTORY: Past Medical History  Diagnosis Date  . Depression     treated in the past for years;stopped in 2010 for a years  . Panic attack   . Claustrophobia   . Fibromyalgia   . IBS (irritable bowel syndrome)     Dr. Elnoria Howard  . Glaucoma, narrow-angle     s/p laser surgery  . Duodenal ulcer 1962    h/o  . Hyperthyroidism     h/o x 2 years  . Shingles 1999    h/o  . Hypothyroid 9/08  . GERD (gastroesophageal reflux disease)   . Trochanteric bursitis 12/2008    bilateral  . Superficial thrombophlebitis 03/2009    RLE  . Bell's palsy 1966    ? right side facial droop  . Osteoporosis 10/11    Dr.Hawkes  . SLE (systemic lupus erythematosus)     with arthralgia/myalgia, positive ANA centromere pattern, positive DNA, positive RNP, indeterminant anti-cardiolipin antibody-Dr.Hawkes.  . Carotid artery disease 2010    on vascular screening;unchanged 2013.(could not tolerate simvastatin, no other statins tried)--<30% blockage bilat 07/2011  . Recurrent UTI     has cystocele-Dr.Grewal  . Chronic fatigue and malaise   . Chronic abdominal pain   . 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 Ocular  migraine     PAST SURGICAL HISTORY: Past Surgical History  Procedure Laterality Date  . Tonsillectomy  1946  . Vaginal hysterectomy  1971    and bladder repair.  Still has ovaries  . Cataract extraction, bilateral  1995, 1996  . Thyroidectomy, partial  09/2005    L nodule; Dr. Gerrit Friends  . Upper gi endoscopy  06/27/12  . Flexible sigmoidoscopy    . Abdominal hysterectomy      FAMILY HISTORY: Family History  Problem Relation Age of Onset  . Heart disease Mother   . Hypertension Mother   . Hypertension Sister   . Diabetes Maternal Grandfather   . Heart disease Brother   . Cancer Brother     lung    SOCIAL HISTORY:  History   Social History  . Marital Status: Married    Spouse Name: N/A  . Number of Children: 2  . Years of Education: N/A   Occupational History  . retired (school system)    Social History Main Topics  . Smoking status: Never Smoker   . Smokeless tobacco: Never Used  . Alcohol Use: No  . Drug Use: No  . Sexual Activity: No   Other Topics Concern  . Not on file   Social History Narrative   Married.  Son lives in [place]; Daughter [name] lives in [place]; 2 grandchildren     PHYSICAL EXAM  Filed Vitals:   03/02/15 0958  BP: 112/68  Pulse: 68  Resp: 14  Height: 4\' 11"  (1.499 m)  Weight: 111 lb (50.349 kg)    Body mass index is 22.41 kg/(m^2).   General: The patient is well-developed and well-nourished and in no acute distress  Neck: The neck is supple, no carotid bruits are noted.  The neck is nontender.  Cardiovascular: The heart has a regular rate and rhythm with a normal S1 and S2. There were no murmurs, gallops or rubs.    Skin: Extremities are without significant edema.  Musculoskeletal:  She is tender over the right subacromial bursa and the right before meals  joint of the shoulder.   Neurologic Exam  Mental status: The patient is alert and oriented x 3 at the time of the examination. The patient has apparent normal recent and  remote memory, with an apparently normal attention span and concentration ability.   Speech is normal.  Cranial nerves: Extraocular movements are full.  Facial symmetry is present. There is good facial sensation to soft touch bilaterally.Facial strength is normal.  Trapezius and sternocleidomastoid strength is normal. No dysarthria is noted.  The tongue is midline, and the patient has symmetric elevation of the soft palate. No obvious hearing deficits are noted.  Motor:  There is no bradykinesia. Fast 7 hz Tremor increases with sustained posture Muscle bulk is normal.   Tone is normal. Strength is  5 / 5 in all 4 extremities.   Sensory: Sensory testing is intact to pinprick, soft touch and vibration sensation in all 4 extremities.  Coordination: Cerebellar testing reveals good finger-nose-finger and heel-to-shin bilaterally.  Gait and station: Station is normal.   Gait is normal. Tandem gait is normal. No significant retropulsion. Romberg is negative.   Reflexes: Deep tendon reflexes are symmetric and normal bilaterally.   Plantar responses are flexor.    DIAGNOSTIC DATA (LABS, IMAGING, TESTING) - I reviewed patient records, labs, notes, testing and imaging myself where available.  Lab Results  Component Value Date   WBC 9.6 02/01/2015   HGB 14.2 02/01/2015   HCT 42.4 02/01/2015   MCV 88.9 02/01/2015   PLT 292 02/01/2015      Component Value Date/Time   NA 142 02/01/2015 0001   K 4.3 02/01/2015 0001   CL 105 02/01/2015 0001   CO2 27 02/01/2015 0001   GLUCOSE 121* 02/01/2015 0001   BUN 17 02/01/2015 0001   CREATININE 0.83 02/01/2015 0001   CREATININE 0.78 07/04/2013 1045   CALCIUM 9.3 02/01/2015 0001   PROT 6.8 02/01/2015 0001   ALBUMIN 4.0 02/01/2015 0001   AST 22 02/01/2015 0001   ALT 17 02/01/2015 0001   ALKPHOS 55 02/01/2015 0001   BILITOT 0.6 02/01/2015 0001   GFRNONAA 77* 07/04/2013 1045   GFRAA 90* 07/04/2013 1045   Lab Results  Component Value Date   CHOL 207*  01/01/2013   HDL 64 01/01/2013   LDLCALC 114* 01/01/2013   TRIG 146 01/01/2013   CHOLHDL 3.2 01/01/2013   No results found for: HGBA1C No results found for: VITAMINB12 Lab Results  Component Value Date   TSH 1.817 02/01/2015       ASSESSMENT AND PLAN  Essential tremor  Subacromial bursitis, right   In summary, Arabellah Hailes is an 81 year old woman with a nine-month history of tremor isolated to the right hand. Although the tremor is unilateral, it otherwise has characteristics most consistent with benign essential tremor. It increases when she is anxious and is decreased with benzodiazepines. She does not have bradykinesia, gait instability or increased muscle tone as would be expected if she had Parkinson's disease. At this time, the tremor is mild and a medication is probably not indicated. She can always take one half of her Xanax, up to 2 pills a day as needed. If the tremor worsens, I would consider putting her on clonazepam or lorazepam to get a longer duration of action.  She has had difficulties tolerating Inderal in the past so I would avoid the beta blockers, at least initially. I will reevaluate her in 6 months to make sure that the tremor has not picked up characteristics more consistent  with Parkinson's. She also has pain in the right shoulder and appears to have a right subacromial bursitis. Using sterile technique, the right subacromial bursa was injected with 40 mg of Depo-Medrol and saline. She tolerated the procedure well.  She will return to see me in 6 months or call sooner if there are new or worsening neurologic symptoms.   Syd Manges A. Epimenio Foot, MD, PhD 03/02/2015, 10:02 AM Certified in Neurology, Clinical Neurophysiology, Sleep Medicine, Pain Medicine and Neuroimaging  [facility] [address] [place], [place] [address] [phone]

## 2015-03-02 NOTE — Telephone Encounter (Signed)
I have spoken with Tracy Malone this afternoon--she wanted to clarify Xanax dosing for tremor.  Per RAS' ov note, I have advised she may take 1/2 tab up to  qid prn.  She verbalized understanding of same/fim

## 2015-03-02 NOTE — Telephone Encounter (Signed)
Patient called inquiring the directions for ALPRAZolam (XANAX) 0.5 MG tablet . Please call and advise. Patient can be reached at [phone].

## 2015-03-03 ENCOUNTER — Ambulatory Visit: Payer: Medicare Other | Admitting: Physical Therapy

## 2015-03-03 ENCOUNTER — Ambulatory Visit (INDEPENDENT_AMBULATORY_CARE_PROVIDER_SITE_OTHER): Payer: Medicare Other | Admitting: Family Medicine

## 2015-03-03 ENCOUNTER — Encounter: Payer: Self-pay | Admitting: Physical Therapy

## 2015-03-03 ENCOUNTER — Encounter: Payer: Self-pay | Admitting: Family Medicine

## 2015-03-03 VITALS — BP 140/76 | HR 56 | Temp 97.9°F | Ht 59.0 in | Wt 113.0 lb

## 2015-03-03 DIAGNOSIS — M79604 Pain in right leg: Secondary | ICD-10-CM | POA: Diagnosis not present

## 2015-03-03 DIAGNOSIS — N3000 Acute cystitis without hematuria: Secondary | ICD-10-CM

## 2015-03-03 DIAGNOSIS — M25661 Stiffness of right knee, not elsewhere classified: Secondary | ICD-10-CM | POA: Diagnosis not present

## 2015-03-03 DIAGNOSIS — M25561 Pain in right knee: Secondary | ICD-10-CM

## 2015-03-03 DIAGNOSIS — R3915 Urgency of urination: Secondary | ICD-10-CM | POA: Diagnosis not present

## 2015-03-03 DIAGNOSIS — M7631 Iliotibial band syndrome, right leg: Secondary | ICD-10-CM

## 2015-03-03 DIAGNOSIS — M79605 Pain in left leg: Secondary | ICD-10-CM | POA: Diagnosis not present

## 2015-03-03 LAB — POCT URINALYSIS DIPSTICK
BILIRUBIN UA: NEGATIVE
Glucose, UA: NEGATIVE
Ketones, UA: NEGATIVE
PH UA: 6
Spec Grav, UA: >=1.03
UROBILINOGEN UA: NEGATIVE

## 2015-03-03 MED ORDER — NITROFURANTOIN MONOHYD MACRO 100 MG PO CAPS: 100.0000 mg | ORAL_CAPSULE | Freq: Two times a day (BID) | ORAL | Status: DC

## 2015-03-03 NOTE — Patient Instructions (Signed)
[employer] a large can or rolled towel under leg. Straighten knee and leg. Hold __3-5__ seconds. Repeat __10__ times. Do ___2_ sessions per day.  http://gt2.exer.us/366   Copyright  VHI. All rights reserved.

## 2015-03-03 NOTE — Progress Notes (Signed)
Chief Complaint  Patient presents with  . Urinary Frequency    started last Friday with urgency and also states that she has some urinary pain at the end of stream.    5 days ago she started with urinary urgency, followed by frequency.  She started having some discomfort on the outside of her vaginal area, and has some soreness in her lower stomach over the bladder. Denies any vaginal soreness, bleeding, or discharge.  She also has some lower back discomfort, which is common for her to get with UTI's (she reports not usually getting the typical symptoms of urgency/frequency/dysuria with prior UTI's).  She has multiple antibiotic allergies, but recalls tolerating macrobid without problems in the past.  She had a second neurologic opinion yesterday for her tremor.  She was more reassured by this visit (essential tremor, vs possible Parkinson's disease as mentioned by first neuro eval--she reports she had been having more knee problems at that time of the first visit, which may have affected her gait).  Interestingly, she reports having tremor with using fork, but NOT with spoon, or holding cup of liquid/drinking.  PMH, PSH, SH reviewed. Denies fevers, chills, nausea, vomiting.  No bleeding, bruising, rash. +lower abdominal pain and slight back pain. Denies epigastric pain recurrence.  Bowels are normal.  Moods have been okay. No bleeding, bruising, rash. Right knee pain has been improving with PT. She recently started using recumbant bicycle.  PHYSICAL EXAM: BP 140/76 mmHg  Pulse 56  Temp(Src) 97.9 F (36.6 C) (Tympanic)  Ht 4\' 11"  (1.499 m)  Wt 113 lb (51.256 kg)  BMI 22.81 kg/m2  Pleasant, elderly female in good spirits, in no distress Abdomen: Mild suprapubic tenderness. No mass, rebound tenderness or guarding Back: No CVA tenderness.  Area of mild discomfort is lower back Skin: normal turgor, no rash Neuro: alert and oriented. Normal gait, strength  Urine dip:  SG >1.030, 3+ leuks, 1+  nitrite, 1+ blood, trace protein  ASSESSMENT/PLAN:  Acute cystitis without hematuria - Plan: Urine culture, nitrofurantoin, macrocrystal-monohydrate, (MACROBID) 100 MG capsule  Urinary urgency - Plan: POCT Urinalysis Dipstick  Risks/side effects of med reviewed. Urine sent for culture.  She has so many allergies, that if the nitrofurantoin isn't effective, it will be difficult to treat.  Technically doesn't have allergy to cipro, just intolerance (cipro)--possibly could use low dose for short-term... She also recalls not tolerating levaquin

## 2015-03-03 NOTE — Patient Instructions (Signed)

## 2015-03-03 NOTE — Therapy (Signed)
[facility] [address] [place], [place], [address] Phone: [phone]   Fax:  [phone]  Physical Therapy Treatment  Patient Details  Name: Tracy Malone MRN: [id number] Date of Birth: 1931/10/26 Referring Provider:  Zenovia Jordan, MD  Encounter Date: 03/03/2015      PT End of Session - 03/03/15 1307    Visit Number 5   Number of Visits 10   Date for PT Re-Evaluation 04/12/15   PT Start Time 1219   PT Stop Time 1310   PT Time Calculation (min) 51 min   Activity Tolerance Patient tolerated treatment well   Behavior During Therapy [facility] for tasks assessed/performed      Past Medical History  Diagnosis Date  . Depression     treated in the past for years;stopped in 2010 for a years  . Panic attack   . Claustrophobia   . Fibromyalgia   . IBS (irritable bowel syndrome)     Dr. Elnoria Howard  . Glaucoma, narrow-angle     s/p laser surgery  . Duodenal ulcer 1962    h/o  . Hyperthyroidism     h/o x 2 years  . Shingles 1999    h/o  . Hypothyroid 9/08  . GERD (gastroesophageal reflux disease)   . Trochanteric bursitis 12/2008    bilateral  . Superficial thrombophlebitis 03/2009    RLE  . Bell's palsy 1966    ? right side facial droop  . Osteoporosis 10/11    Dr.Hawkes  . SLE (systemic lupus erythematosus)     with arthralgia/myalgia, positive ANA centromere pattern, positive DNA, positive RNP, indeterminant anti-cardiolipin antibody-Dr.Hawkes.  . Carotid artery disease 2010    on vascular screening;unchanged 2013.(could not tolerate simvastatin, no other statins tried)--<30% blockage bilat 07/2011  . Recurrent UTI     has cystocele-Dr.Grewal  . Chronic fatigue and malaise   . Chronic abdominal pain   . 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 Ocular migraine     Past Surgical History  Procedure Laterality Date  . Tonsillectomy  1946  . Vaginal hysterectomy  1971     and bladder repair.  Still has ovaries  . Cataract extraction, bilateral  1995, 1996  . Thyroidectomy, partial  09/2005    L nodule; Dr. Gerrit Friends  . Upper gi endoscopy  06/27/12  . Flexible sigmoidoscopy    . Abdominal hysterectomy      There were no vitals filed for this visit.  Visit Diagnosis:  Right anterior knee pain  Knee stiffness, right  Pain of right lower extremity  Iliotibial band syndrome, right      Subjective Assessment - 03/03/15 1238    Subjective I woke up this morning and my knee did not hurt when I stepped on it.    Currently in Pain? No/denies   Multiple Pain Sites No                         [facility] Adult PT Treatment/Exercise - 03/03/15 0001    Knee/Hip Exercises: Stretches   Other Knee/Hip Stretches 5x 3 sec holds   Knee/Hip Exercises: Aerobic   Nustep L1 10 min   Knee/Hip Exercises: Supine   Short Arc Quad Sets Strengthening;Right;1 set;10 reps   Ultrasound   Ultrasound Location Rt lateral gastroc   Ultrasound Parameters 100% 1.2wtcm2 100%   Ultrasound Goals Pain   Manual Therapy   Manual  Therapy Soft tissue mobilization   Soft tissue mobilization RT gastroc, IT BAnd, hamstring, quad                PT Education - 03/03/15 1302    Education provided Yes   Education Details SAQ for [employer]) Educated Patient   Methods Explanation;Demonstration;Tactile cues;Verbal cues;Handout   Comprehension Verbalized understanding;Returned demonstration          PT Short Term Goals - 03/01/15 1312    PT SHORT TERM GOAL #1   Title Independent with HEP    Time 4   Period Weeks   Status Achieved   PT SHORT TERM GOAL #2   Title Report 30% decrease in Rt. knee pain with sitting and knitting activities    Time 4   Period Weeks   Status --  10%   PT SHORT TERM GOAL #3   Title Report 30% decrease in Rt knee pain with getting in and out of car    Time 4   Period Weeks   Status On-going  10%   PT SHORT TERM GOAL #4    Title Improve AROM knee ROM to -10 degrees on Rt. knee for improved gait mechanics    Time 4   Period Weeks   Status On-going  will take at end of week.            PT Long Term Goals - 02/15/15 1243    PT LONG TERM GOAL #1   Title Independent with advanced HEP    Time 8   Status New   PT LONG TERM GOAL #2   Title Improve Rt. hamstring strength to 4+/5 to improve walking ability    Time 8   Period Weeks   Status New   PT LONG TERM GOAL #3   Title Increase walking time to 45 minutes without being limited by Rt. knee pain    Time 8   Period Weeks   Status New   PT LONG TERM GOAL #4   Title Improve AROM to > or = -5 degrees to improve gait mechanics    Time 8   Period Weeks   Status New   PT LONG TERM GOAL #5   Title Report 70% improvement in Rt. knee pain with sitting activities    Time 8   Period Weeks   Additional Long Term Goals   Additional Long Term Goals Yes   PT LONG TERM GOAL #6   Title Improve FOTO score limitation to < or = 49%    Time 8   Period Weeks   Status New               Plan - 03/03/15 1309    Clinical Impression Statement Pt was able to put weight into her RT leg this morning without any pain. This was the first time in a long time this happened. She is tolerating stretching her leg into extension better and was able to add SAQ to her HEP this week.    Pt will benefit from skilled therapeutic intervention in order to improve on the following deficits Abnormal gait;Pain;Increased muscle spasms;Decreased activity tolerance;Decreased endurance;Decreased strength;Difficulty walking;Impaired flexibility   Clinical Impairments Affecting Rehab Potential None   PT Frequency 2x / week   PT Duration 8 weeks   PT Treatment/Interventions ADLs/Self Care Home Management;Cryotherapy;Electrical Stimulation;Gait training;Ultrasound;Moist Heat;Iontophoresis 4mg /ml Dexamethasone;Stair training;Therapeutic activities;Functional mobility training;Therapeutic  exercise;Neuromuscular re-education;Manual techniques;Patient/family education;Orthotic Fit/Training;Passive range of motion;Dry needling   PT Next Visit  Plan Nu Step, manual and [place] to knee area, Knee ROM, work on not contracting the quad too much when walking. Review SAQ.   Consulted and Agree with Plan of Care Patient        Problem List Patient Active Problem List   Diagnosis Date Noted  . Essential tremor 03/02/2015  . Subacromial bursitis 03/02/2015  . Resting tremor 02/01/2015  . Epigastric fullness 10/12/2014  . Early satiety 10/12/2014  . Cystocele 12/30/2013  . IBS (irritable bowel syndrome) 09/30/2013  . GERD (gastroesophageal reflux disease) 06/27/2013  . PVC's (premature ventricular contractions) 10/02/2012  . Depressive disorder, not elsewhere classified 10/02/2012  . H. pylori infection 08/01/2012  . Bradycardia 08/01/2012  . Fatigue 08/01/2012  . Anxiety state, unspecified 05/13/2012  . Osteopenia 05/13/2012  . Hypothyroidism 05/13/2012    Dan Dissinger, PTA 03/03/2015, 1:12 PM  [facility] [address], [address] [place], [place], [address] Phone: [phone]   Fax:  [phone]

## 2015-03-04 ENCOUNTER — Encounter: Payer: Self-pay | Admitting: Neurology

## 2015-03-04 ENCOUNTER — Encounter: Payer: Self-pay | Admitting: Internal Medicine

## 2015-03-04 ENCOUNTER — Ambulatory Visit (INDEPENDENT_AMBULATORY_CARE_PROVIDER_SITE_OTHER): Payer: Medicare Other | Admitting: Internal Medicine

## 2015-03-04 VITALS — BP 149/76 | HR 80 | Temp 98.3°F | Wt 110.0 lb

## 2015-03-04 DIAGNOSIS — A048 Other specified bacterial intestinal infections: Secondary | ICD-10-CM

## 2015-03-04 DIAGNOSIS — B9681 Helicobacter pylori [H. pylori] as the cause of diseases classified elsewhere: Secondary | ICD-10-CM

## 2015-03-04 DIAGNOSIS — N3000 Acute cystitis without hematuria: Secondary | ICD-10-CM

## 2015-03-04 NOTE — Progress Notes (Signed)
Rfv: follow up on h.pylori treatment Subjective:    Patient ID: Tracy Malone, female    DOB: 1931-11-27, 81 y.o.   MRN: [id number]  HPI  She is doing well since we finished treatment for hpylori in mid july. She did break out in rash on last day of treatment. All resolved. She notices having metalic smell occasionally and wonders if it is related to abtx. She states she does not notice anymore dyspepsia but recently having urinary frequency, urgency with slight dysuria. She went to pcp who is culturing her urine.  She is being treated for uti with macrobid  Allergies  Allergen Reactions  . Salmon [Fish Allergy] Hives and Shortness Of Breath  . Shellfish Allergy Anaphylaxis  . Aspirin Other (See Comments)    Sever stomach pain due to ulcer scaring.  . Ciprofloxacin Diarrhea  . Codeine Nausea And Vomiting  . Darvocet [Propoxyphene N-Acetaminophen] Nausea And Vomiting  . Demerol [Meperidine] Nausea And Vomiting    Violent vomitting  . Diphedryl [Diphenhydramine] Other (See Comments)    Increased pulse/small amount ok  . Doxycycline Hyclate Other (See Comments)    GI intolerance.  Marland Kitchen Epinephrine Other (See Comments)    Breathing problems  . Erythromycin Other (See Comments)    GI intolerance.  Yvette Rack [Cyclobenzaprine] Other (See Comments)    Tingly/prickly sensation.  . Iodine   . Keflex [Cephalexin] Hives  . Latex Other (See Comments)    Gloves ok  . Prednisone Other (See Comments)    Headache  . Pylera [Bis Subcit-Metronid-Tetracyc] Swelling    Tongue swelling. Face tingling  . Sulfa Antibiotics Other (See Comments)    Increased pulse, fainting, diarrhea, thrush  . Xylocaine [Lidocaine Hcl]     With epinephrine, given by dentist.  Speeded up heart rate and she passed out (occured twice, at dentist)  . Zoloft [Sertraline Hcl] Swelling and Other (See Comments)    Migraine Swelling of tongue/lip (09/2012)  . Advil [Ibuprofen] Rash    Motrin ok with a GI effect.  .  Clarithromycin Rash    Started after completing 10 day course of 2000 mg /day  . Penicillins Hives and Rash   Current Outpatient Prescriptions on File Prior to Visit  Medication Sig Dispense Refill  . acetaminophen (TYLENOL) 650 MG CR tablet Take 650 mg by mouth as needed for pain.    Marland Kitchen ALPRAZolam (XANAX) 0.5 MG tablet Take 0.25 mg by mouth 2 (two) times daily as needed.     . ARTIFICIAL TEAR OP Apply 1-2 drops to eye 4 (four) times daily as needed (dry eyes).     Marland Kitchen b complex vitamins capsule Take 1 capsule by mouth daily.     . Biotin 1000 MCG tablet Take 1,000 mcg by mouth daily.    . Cholecalciferol (VITAMIN D) 2000 UNITS tablet Take 2,000 Units by mouth daily.    . Loperamide HCl (IMODIUM PO) Take 1 tablet by mouth as needed.    . Magnesium 250 MG TABS Take 1 tablet by mouth daily.    . nitrofurantoin, macrocrystal-monohydrate, (MACROBID) 100 MG capsule Take 1 capsule (100 mg total) by mouth 2 (two) times daily. 14 capsule 0  . polyethylene glycol (MIRALAX / GLYCOLAX) packet Take 17 g by mouth as needed.     . ranitidine (ZANTAC) 75 MG tablet Take 75 mg by mouth 2 (two) times daily.    Marland Kitchen SYNTHROID 50 MCG tablet Take 0.5-1 tablets (25-50 mcg total) by mouth daily before breakfast. Take half  tab M, W, F, full tab T, Th ,Sat and Sun 90 tablet 1  . temazepam (RESTORIL) 15 MG capsule Take 15 mg by mouth at bedtime as needed for sleep.    . vitamin E 400 UNIT capsule Take 400 Units by mouth every Monday, Wednesday, and Friday. 3 times weekly     No current facility-administered medications on file prior to visit.   Active Ambulatory Problems    Diagnosis Date Noted  . Anxiety state, unspecified 05/13/2012  . Osteopenia 05/13/2012  . Hypothyroidism 05/13/2012  . H. pylori infection 08/01/2012  . Bradycardia 08/01/2012  . Fatigue 08/01/2012  . PVC's (premature ventricular contractions) 10/02/2012  . Depressive disorder, not elsewhere classified 10/02/2012  . GERD (gastroesophageal reflux  disease) 06/27/2013  . IBS (irritable bowel syndrome) 09/30/2013  . Cystocele 12/30/2013  . Epigastric fullness 10/12/2014  . Early satiety 10/12/2014  . Resting tremor 02/01/2015  . Essential tremor 03/02/2015  . Subacromial bursitis 03/02/2015   Resolved Ambulatory Problems    Diagnosis Date Noted  . Epigastric abdominal pain 05/13/2012   Past Medical History  Diagnosis Date  . Depression   . Panic attack   . Claustrophobia   . Fibromyalgia   . Glaucoma, narrow-angle   . Duodenal ulcer 1962  . Hyperthyroidism   . Shingles 1999  . Hypothyroid 9/08  . Trochanteric bursitis 12/2008  . Superficial thrombophlebitis 03/2009  . Bell's palsy 1966  . Osteoporosis 10/11  . SLE (systemic lupus erythematosus)   . Carotid artery disease 2010  . Recurrent UTI   . Chronic fatigue and malaise   . Chronic abdominal pain   . Frequent PVCs 07/2012  . Ocular migraine      Review of Systems 10 point ros reviewed, positive pertinents listed above    Objective:   Physical Exam BP 149/76 mmHg  Pulse 80  Temp(Src) 98.3 F (36.8 C) (Oral)  Wt 110 lb (49.896 kg)  No exam      Assessment & Plan:  H.pylori infection = presumably cured. Patient has not had any signs or symptoms like she had previously. For now we will keep watching her every 3-4 months x 2 to ensure she is getgin back to her baseline.  uti = patient has rx of nitrofurantoin which i recommended to start. And can change if needed  Metallic taste = likely sequelae from antibiotics, anticipate to be transient.

## 2015-03-05 ENCOUNTER — Telehealth: Payer: Self-pay | Admitting: *Deleted

## 2015-03-05 NOTE — Telephone Encounter (Signed)
I have spoken with Tracy Malone this morning--she has noted that her f/u appt. with [facility] indicates she is coming in for f/u MS.  I have advised that this is simply how Dr. Garth Bigness appt's are listed in our appt. system, that it is not a dx. in her chart, and does not indicate that she has or that [name] feels  she has MS. , it is simply how his appts are scheduled.  I have apologized for any concern this caused her.  She verbalized understanding of same/fim

## 2015-03-06 LAB — URINE CULTURE

## 2015-03-08 ENCOUNTER — Ambulatory Visit: Payer: Medicare Other | Admitting: Physical Therapy

## 2015-03-08 ENCOUNTER — Encounter: Payer: Self-pay | Admitting: Physical Therapy

## 2015-03-08 DIAGNOSIS — M25661 Stiffness of right knee, not elsewhere classified: Secondary | ICD-10-CM

## 2015-03-08 DIAGNOSIS — M25561 Pain in right knee: Secondary | ICD-10-CM

## 2015-03-08 DIAGNOSIS — M79605 Pain in left leg: Secondary | ICD-10-CM | POA: Diagnosis not present

## 2015-03-08 DIAGNOSIS — M7631 Iliotibial band syndrome, right leg: Secondary | ICD-10-CM | POA: Diagnosis not present

## 2015-03-08 DIAGNOSIS — M79604 Pain in right leg: Secondary | ICD-10-CM | POA: Diagnosis not present

## 2015-03-08 NOTE — Therapy (Signed)
[facility] [address] [place], [place], [address] Phone: [phone]   Fax:  [phone]  Physical Therapy Treatment  Patient Details  Name: Tracy Malone MRN: [id number] Date of Birth: Dec 10, 1931 Referring Provider:  Gavin Pound, MD  Encounter Date: 03/08/2015      PT End of Session - 03/08/15 1306    Visit Number 6   Number of Visits 10   Date for PT Re-Evaluation 04/12/15   PT Start Time 1225   PT Stop Time 1308   PT Time Calculation (min) 43 min   Activity Tolerance Patient tolerated treatment well   Behavior During Therapy [facility] for tasks assessed/performed      Past Medical History  Diagnosis Date  . Depression     treated in the past for years;stopped in 2010 for a years  . Panic attack   . Claustrophobia   . Fibromyalgia   . IBS (irritable bowel syndrome)     Dr. Benson Norway  . Glaucoma, narrow-angle     s/p laser surgery  . Duodenal ulcer 1962    h/o  . Hyperthyroidism     h/o x 2 years  . Shingles 1999    h/o  . Hypothyroid 9/08  . GERD (gastroesophageal reflux disease)   . Trochanteric bursitis 12/2008    bilateral  . Superficial thrombophlebitis 03/2009    RLE  . Bell's palsy 1966    ? right side facial droop  . Osteoporosis 10/11    Dr.Hawkes  . SLE (systemic lupus erythematosus)     with arthralgia/myalgia, positive ANA centromere pattern, positive DNA, positive RNP, indeterminant anti-cardiolipin antibody-Dr.Hawkes.  . Carotid artery disease 2010    on vascular screening;unchanged 2013.(could not tolerate simvastatin, no other statins tried)--<30% blockage bilat 07/2011  . Recurrent UTI     has cystocele-Dr.Grewal  . Chronic fatigue and malaise   . Chronic abdominal pain   . Frequent PVCs 07/2012    Seen by [facility]: benign, asymptomatic, normal EF  . Ocular migraine     Past Surgical History  Procedure Laterality Date  . Tonsillectomy  1946  . Vaginal hysterectomy  1971     and bladder repair.  Still has ovaries  . Cataract extraction, bilateral  1995, 1996  . Thyroidectomy, partial  09/2005    L nodule; Dr. Harlow Asa  . Upper gi endoscopy  06/27/12  . Flexible sigmoidoscopy    . Abdominal hysterectomy      There were no vitals filed for this visit.  Visit Diagnosis:  Right anterior knee pain  Knee stiffness, right  Pain of right lower extremity  Iliotibial band syndrome, right  Pain of left lower extremity      Subjective Assessment - 03/08/15 1234    Subjective Knee continues to do well first thing in the morning. Main pain is when stretching into extension.   Currently in Pain? No/denies   Multiple Pain Sites No            [facility] PT Assessment - 03/08/15 0001    AROM   Right Knee Extension 10                     [facility] Adult PT Treatment/Exercise - 03/08/15 0001    Knee/Hip Exercises: Aerobic   Nustep L1 10 min   Knee/Hip Exercises: Supine   Short Arc Quad Sets AROM;Right;2 sets;10 reps   Ultrasound   Ultrasound Location RT lateral knee gastroc   Ultrasound  Parameters 100% 1.2wt/cm2 8 min   Ultrasound Goals Pain   Manual Therapy   Manual Therapy Soft tissue mobilization   Soft tissue mobilization RT ITB, lateral kne and lateral gastroc                  PT Short Term Goals - 03/08/15 1238    PT SHORT TERM GOAL #1   Title Independent with HEP    Time 4   Period Weeks   Status Achieved   PT SHORT TERM GOAL #2   Title Report 30% decrease in Rt. knee pain with sitting and knitting activities    Time 4   Period Weeks   Status Achieved  85%   PT SHORT TERM GOAL #3   Title Report 30% decrease in Rt knee pain with getting in and out of car    Time 4   Period Weeks   Status Achieved  85%           PT Long Term Goals - 02/15/15 1243    PT LONG TERM GOAL #1   Title Independent with advanced HEP    Time 8   Status New   PT LONG TERM GOAL #2   Title Improve Rt. hamstring strength to 4+/5 to  improve walking ability    Time 8   Period Weeks   Status New   PT LONG TERM GOAL #3   Title Increase walking time to 45 minutes without being limited by Rt. knee pain    Time 8   Period Weeks   Status New   PT LONG TERM GOAL #4   Title Improve AROM to > or = -5 degrees to improve gait mechanics    Time 8   Period Weeks   Status New   PT LONG TERM GOAL #5   Title Report 70% improvement in Rt. knee pain with sitting activities    Time 8   Period Weeks   Additional Long Term Goals   Additional Long Term Goals Yes   PT LONG TERM GOAL #6   Title Improve FOTO score limitation to < or = 49%    Time 8   Period Weeks   Status New               Plan - 03/08/15 1307    Clinical Impression Statement Knee extension improving, progressing towards goal. Pain is 85% reduced.   Pt will benefit from skilled therapeutic intervention in order to improve on the following deficits Abnormal gait;Pain;Increased muscle spasms;Decreased activity tolerance;Decreased endurance;Decreased strength;Difficulty walking;Impaired flexibility   Rehab Potential Good   Clinical Impairments Affecting Rehab Potential None   PT Frequency 2x / week   PT Duration 8 weeks   PT Treatment/Interventions ADLs/Self Care Home Management;Cryotherapy;Electrical Stimulation;Gait training;Ultrasound;Moist Heat;Iontophoresis 4mg /ml Dexamethasone;Stair training;Therapeutic activities;Functional mobility training;Therapeutic exercise;Neuromuscular re-education;Manual techniques;Patient/family education;Orthotic Fit/Training;Passive range of motion;Dry needling   PT Next Visit Plan Nu Step, manual and [place] to knee area, Knee ROM, work on not contracting the quad too much when walking. Add LAQ ex.   Consulted and Agree with Plan of Care Patient        Problem List Patient Active Problem List   Diagnosis Date Noted  . Essential tremor 03/02/2015  . Subacromial bursitis 03/02/2015  . Resting tremor 02/01/2015  .  Epigastric fullness 10/12/2014  . Early satiety 10/12/2014  . Cystocele 12/30/2013  . IBS (irritable bowel syndrome) 09/30/2013  . GERD (gastroesophageal reflux disease) 06/27/2013  . PVC's (premature ventricular contractions)  10/02/2012  . Depressive disorder, not elsewhere classified 10/02/2012  . H. pylori infection 08/01/2012  . Bradycardia 08/01/2012  . Fatigue 08/01/2012  . Anxiety state, unspecified 05/13/2012  . Osteopenia 05/13/2012  . Hypothyroidism 05/13/2012    COCHRAN,JENNIFER, PTA 03/08/2015, 1:12 PM  [facility] [address] [place], [place], [address] Phone: [phone]   Fax:  [phone]

## 2015-03-09 ENCOUNTER — Telehealth: Payer: Self-pay | Admitting: Family Medicine

## 2015-03-09 ENCOUNTER — Ambulatory Visit: Payer: Medicare Other

## 2015-03-09 DIAGNOSIS — N3000 Acute cystitis without hematuria: Secondary | ICD-10-CM

## 2015-03-09 MED ORDER — NITROFURANTOIN MONOHYD MACRO 100 MG PO CAPS: 100.0000 mg | ORAL_CAPSULE | Freq: Two times a day (BID) | ORAL | Status: DC

## 2015-03-09 NOTE — Telephone Encounter (Signed)
Pt called and stated she is still having issues with UTI. She is requesting another round of rx be sent to [facility]. Pt can be reached at [phone].

## 2015-03-09 NOTE — Telephone Encounter (Signed)
Please advise pt that refill was sent

## 2015-03-10 ENCOUNTER — Ambulatory Visit: Payer: Medicare Other

## 2015-03-10 ENCOUNTER — Encounter: Payer: Self-pay | Admitting: *Deleted

## 2015-03-10 DIAGNOSIS — M25661 Stiffness of right knee, not elsewhere classified: Secondary | ICD-10-CM | POA: Diagnosis not present

## 2015-03-10 DIAGNOSIS — M79604 Pain in right leg: Secondary | ICD-10-CM | POA: Diagnosis not present

## 2015-03-10 DIAGNOSIS — M7631 Iliotibial band syndrome, right leg: Secondary | ICD-10-CM | POA: Diagnosis not present

## 2015-03-10 DIAGNOSIS — M25561 Pain in right knee: Secondary | ICD-10-CM

## 2015-03-10 DIAGNOSIS — F411 Generalized anxiety disorder: Secondary | ICD-10-CM | POA: Diagnosis not present

## 2015-03-10 DIAGNOSIS — M79605 Pain in left leg: Secondary | ICD-10-CM | POA: Diagnosis not present

## 2015-03-10 NOTE — Therapy (Addendum)
[facility] [address] [place], [place], [address] Phone: [phone]   Fax:  [phone]  Physical Therapy Treatment  Patient Details  Name: Tracy Malone MRN: [id number] Date of Birth: May 11, 1932 Referring Provider:  Zenovia Jordan, MD  Encounter Date: 03/10/2015      PT End of Session - 03/10/15 1517    Visit Number 7   Number of Visits 10   Date for PT Re-Evaluation 04/12/15   PT Start Time 1436   PT Stop Time 1524   PT Time Calculation (min) 48 min   Activity Tolerance Patient tolerated treatment well   Behavior During Therapy [facility] for tasks assessed/performed      Past Medical History  Diagnosis Date  . Depression     treated in the past for years;stopped in 2010 for a years  . Panic attack   . Claustrophobia   . Fibromyalgia   . IBS (irritable bowel syndrome)     Dr. Elnoria Howard  . Glaucoma, narrow-angle     s/p laser surgery  . Duodenal ulcer 1962    h/o  . Hyperthyroidism     h/o x 2 years  . Shingles 1999    h/o  . Hypothyroid 9/08  . GERD (gastroesophageal reflux disease)   . Trochanteric bursitis 12/2008    bilateral  . Superficial thrombophlebitis 03/2009    RLE  . Bell's palsy 1966    ? right side facial droop  . Osteoporosis 10/11    Dr.Hawkes  . SLE (systemic lupus erythematosus)     with arthralgia/myalgia, positive ANA centromere pattern, positive DNA, positive RNP, indeterminant anti-cardiolipin antibody-Dr.Hawkes.  . Carotid artery disease 2010    on vascular screening;unchanged 2013.(could not tolerate simvastatin, no other statins tried)--<30% blockage bilat 07/2011  . Recurrent UTI     has cystocele-Dr.Grewal  . Chronic fatigue and malaise   . Chronic abdominal pain   . Frequent PVCs 07/2012    Seen by [facility]: benign, asymptomatic, normal EF  . Ocular migraine     Past Surgical History  Procedure Laterality Date  . Tonsillectomy  1946  . Vaginal hysterectomy  1971     and bladder repair.  Still has ovaries  . Cataract extraction, bilateral  1995, 1996  . Thyroidectomy, partial  09/2005    L nodule; Dr. Gerrit Friends  . Upper gi endoscopy  06/27/12  . Flexible sigmoidoscopy    . Abdominal hysterectomy      There were no vitals filed for this visit.  Visit Diagnosis:  Right anterior knee pain  Knee stiffness, right  Pain of right lower extremity      Subjective Assessment - 03/10/15 1530    Subjective Rt knee only hurts when doing exercise.  Overall pain is better.     Pain Score 0-No pain   Pain Location Knee   Pain Orientation Right   Pain Descriptors / Indicators Tightness   Aggravating Factors  straighening my knee when walking   Pain Relieving Factors ice, meds                         [facility] Adult PT Treatment/Exercise - 03/10/15 0001    Knee/Hip Exercises: Stretches   Active Hamstring Stretch Right;3 reps;20 seconds  seated and supine with strap   Knee/Hip Exercises: Aerobic   Nustep L1 10 min  seat 6, arms 9   Knee/Hip Exercises: Seated   Long Arc Quad Strengthening;Right;2 sets;10 reps  Knee/Hip Exercises: Supine   Short Arc Quad Sets AROM;Right;2 sets;10 reps   Straight Leg Raises Right;Strengthening;2 sets;10 reps   Ultrasound   Ultrasound Location Rt medial and lateral knee joint   Ultrasound Parameters 100% 1.1 w/cm2 x 8 mintues   Ultrasound Goals Pain   Manual Therapy   Manual Therapy Soft tissue mobilization   Soft tissue mobilization RT ITB, lateral kne and lateral gastroc                PT Education - 03/10/15 1513    Education provided Yes   Education Details LAQs   Person(s) Educated Patient   Methods Explanation;Demonstration;Handout   Comprehension Verbalized understanding;Returned demonstration          PT Short Term Goals - 03/08/15 1238    PT SHORT TERM GOAL #1   Title Independent with HEP    Time 4   Period Weeks   Status Achieved   PT SHORT TERM GOAL #2   Title Report  30% decrease in Rt. knee pain with sitting and knitting activities    Time 4   Period Weeks   Status Achieved  85%   PT SHORT TERM GOAL #3   Title Report 30% decrease in Rt knee pain with getting in and out of car    Time 4   Period Weeks   Status Achieved  85%           PT Long Term Goals - 02/15/15 1243    PT LONG TERM GOAL #1   Title Independent with advanced HEP    Time 8   Status New   PT LONG TERM GOAL #2   Title Improve Rt. hamstring strength to 4+/5 to improve walking ability    Time 8   Period Weeks   Status New   PT LONG TERM GOAL #3   Title Increase walking time to 45 minutes without being limited by Rt. knee pain    Time 8   Period Weeks   Status New   PT LONG TERM GOAL #4   Title Improve AROM to > or = -5 degrees to improve gait mechanics    Time 8   Period Weeks   Status New   PT LONG TERM GOAL #5   Title Report 70% improvement in Rt. knee pain with sitting activities    Time 8   Period Weeks   Additional Long Term Goals   Additional Long Term Goals Yes   PT LONG TERM GOAL #6   Title Improve FOTO score limitation to < or = 49%    Time 8   Period Weeks   Status New               Plan - 03/10/15 1447    Clinical Impression Statement Pt with Rt knee AROM knee extension lacking 10 degrees.  Pt with Rt knee pain with exercises but overall pain is reduced by 85%.  Pt will benefit from skilled PT for strength, flexibility, manual and modalities.     Pt will benefit from skilled therapeutic intervention in order to improve on the following deficits Abnormal gait;Pain;Increased muscle spasms;Decreased activity tolerance;Decreased endurance;Decreased strength;Difficulty walking;Impaired flexibility   Rehab Potential Good   PT Frequency 2x / week   PT Duration 8 weeks   PT Treatment/Interventions ADLs/Self Care Home Management;Cryotherapy;Electrical Stimulation;Gait training;Ultrasound;Moist Heat;Iontophoresis 4mg /ml Dexamethasone;Stair  training;Therapeutic activities;Functional mobility training;Therapeutic exercise;Neuromuscular re-education;Manual techniques;Patient/family education;Orthotic Fit/Training;Passive range of motion;Dry needling   PT Next Visit Plan Nu Step,  manual and [place] to knee area, Knee ROM, review LAQ from HEP   Consulted and Agree with Plan of Care Patient        Problem List Patient Active Problem List   Diagnosis Date Noted  . Essential tremor 03/02/2015  . Subacromial bursitis 03/02/2015  . Resting tremor 02/01/2015  . Epigastric fullness 10/12/2014  . Early satiety 10/12/2014  . Cystocele 12/30/2013  . IBS (irritable bowel syndrome) 09/30/2013  . GERD (gastroesophageal reflux disease) 06/27/2013  . PVC's (premature ventricular contractions) 10/02/2012  . Depressive disorder, not elsewhere classified 10/02/2012  . H. pylori infection 08/01/2012  . Bradycardia 08/01/2012  . Fatigue 08/01/2012  . Anxiety state, unspecified 05/13/2012  . Osteopenia 05/13/2012  . Hypothyroidism 05/13/2012    Raejean Swinford, PT 03/10/2015, 3:33 PM  [facility] [address], [address] [place], [place], [address] Phone: [phone]   Fax:  [phone]

## 2015-03-10 NOTE — Patient Instructions (Signed)
KNEE: Extension, Long Arc Quads - Sitting   Raise leg until knee is straight.  Hold 5 seconds __10_ reps per set, __2_ sets per day,   [facility] [address] [place], [place] [address] Phone # [phone] Fax [phone]  Copyright  [employer]. All rights reserved.

## 2015-03-10 NOTE — Telephone Encounter (Signed)
No answer or answering machine-sent pt a my chart message to let her know.

## 2015-03-11 ENCOUNTER — Ambulatory Visit
Admission: RE | Admit: 2015-03-11 | Discharge: 2015-03-11 | Disposition: A | Discharge status: Home / Self Care | Payer: Medicare Other | Source: Ambulatory Visit

## 2015-03-11 DIAGNOSIS — Z1231 Encounter for screening mammogram for malignant neoplasm of breast: Secondary | ICD-10-CM

## 2015-03-15 ENCOUNTER — Encounter: Payer: Self-pay | Admitting: Physical Therapy

## 2015-03-15 ENCOUNTER — Ambulatory Visit (INDEPENDENT_AMBULATORY_CARE_PROVIDER_SITE_OTHER): Payer: Medicare Other | Admitting: Licensed Clinical Social Worker

## 2015-03-15 ENCOUNTER — Ambulatory Visit: Payer: Medicare Other | Admitting: Physical Therapy

## 2015-03-15 DIAGNOSIS — M79605 Pain in left leg: Secondary | ICD-10-CM

## 2015-03-15 DIAGNOSIS — M25661 Stiffness of right knee, not elsewhere classified: Secondary | ICD-10-CM | POA: Diagnosis not present

## 2015-03-15 DIAGNOSIS — M7631 Iliotibial band syndrome, right leg: Secondary | ICD-10-CM

## 2015-03-15 DIAGNOSIS — M25561 Pain in right knee: Secondary | ICD-10-CM

## 2015-03-15 DIAGNOSIS — F419 Anxiety disorder, unspecified: Secondary | ICD-10-CM

## 2015-03-15 DIAGNOSIS — M79604 Pain in right leg: Secondary | ICD-10-CM | POA: Diagnosis not present

## 2015-03-15 NOTE — Therapy (Signed)
[facility] [address], [address] [place], [place], [address] Phone: [phone]   Fax:  [phone]  Physical Therapy Treatment  Patient Details  Name: Tracy Malone MRN: [id number] Date of Birth: 07-02-1932 Referring Provider:  Zenovia Jordan, MD  Encounter Date: 03/15/2015      PT End of Session - 03/15/15 1236    Visit Number 8   Number of Visits 10   Date for PT Re-Evaluation 04/12/15   PT Start Time 1228   PT Stop Time 1310   PT Time Calculation (min) 42 min   Activity Tolerance Patient tolerated treatment well   Behavior During Therapy [facility] for tasks assessed/performed      Past Medical History  Diagnosis Date  . Depression     treated in the past for years;stopped in 2010 for a years  . Panic attack   . Claustrophobia   . Fibromyalgia   . IBS (irritable bowel syndrome)     Dr. Elnoria Howard  . Glaucoma, narrow-angle     s/p laser surgery  . Duodenal ulcer 1962    h/o  . Hyperthyroidism     h/o x 2 years  . Shingles 1999    h/o  . Hypothyroid 9/08  . GERD (gastroesophageal reflux disease)   . Trochanteric bursitis 12/2008    bilateral  . Superficial thrombophlebitis 03/2009    RLE  . Bell's palsy 1966    ? right side facial droop  . Osteoporosis 10/11    Dr.Hawkes  . SLE (systemic lupus erythematosus)     with arthralgia/myalgia, positive ANA centromere pattern, positive DNA, positive RNP, indeterminant anti-cardiolipin antibody-Dr.Hawkes.  . Carotid artery disease 2010    on vascular screening;unchanged 2013.(could not tolerate simvastatin, no other statins tried)--<30% blockage bilat 07/2011  . Recurrent UTI     has cystocele-Dr.Grewal  . Chronic fatigue and malaise   . Chronic abdominal pain   . Frequent PVCs 07/2012    Seen by [facility]: benign, asymptomatic, normal EF  . Ocular migraine     Past Surgical History  Procedure Laterality Date  . Tonsillectomy  1946  . Vaginal hysterectomy  1971     and bladder repair.  Still has ovaries  . Cataract extraction, bilateral  1995, 1996  . Thyroidectomy, partial  09/2005    L nodule; Dr. Gerrit Friends  . Upper gi endoscopy  06/27/12  . Flexible sigmoidoscopy    . Abdominal hysterectomy      There were no vitals filed for this visit.  Visit Diagnosis:  Right anterior knee pain  Pain of left lower extremity  Iliotibial band syndrome, right  Knee stiffness, right  Pain of right lower extremity      Subjective Assessment - 03/15/15 1231    Subjective Had horrible pain for 3 days, had to stop stretching all together. Today it is better.    Currently in Pain? Yes   Pain Score 2    Pain Location Knee   Pain Orientation Right   Pain Descriptors / Indicators Dull   Aggravating Factors  Overdoing my exercises   Pain Relieving Factors ice, meds, rest                         [facility] Adult PT Treatment/Exercise - 03/15/15 0001    Knee/Hip Exercises: Aerobic   Nustep L1 x 10 min   Knee/Hip Exercises: Standing   Other Standing Knee Exercises IT band release at [employer] with  foam roll   Knee/Hip Exercises: Seated   Long Arc Quad --  6x   Knee/Hip Exercises: Supine   Short Arc Quad Sets AROM;Right;2 sets;5 sets   Other Supine Knee/Hip Exercises Gastroc release on soft foam roll 3 positions of calf                   PT Short Term Goals - 03/08/15 1238    PT SHORT TERM GOAL #1   Title Independent with HEP    Time 4   Period Weeks   Status Achieved   PT SHORT TERM GOAL #2   Title Report 30% decrease in Rt. knee pain with sitting and knitting activities    Time 4   Period Weeks   Status Achieved  85%   PT SHORT TERM GOAL #3   Title Report 30% decrease in Rt knee pain with getting in and out of car    Time 4   Period Weeks   Status Achieved  85%           PT Long Term Goals - 03/15/15 1236    PT LONG TERM GOAL #1   Title Independent with advanced HEP    Time 8   Period Weeks   Status On-going   working towards   PT LONG TERM GOAL #3   Title Increase walking time to 45 minutes without being limited by Rt. knee pain    Time 8   Period Weeks   Status Achieved  45 min               Plan - 03/15/15 1253    Clinical Impression Statement Pt had a lot of icreased pain with increase in volume last session. She has only rested until today due to pain. Today was better, but we reduced the load significantly. Tried some release work with foam rolls to [employer] and post knees.    Pt will benefit from skilled therapeutic intervention in order to improve on the following deficits Abnormal gait;Pain;Increased muscle spasms;Decreased activity tolerance;Decreased endurance;Decreased strength;Difficulty walking;Impaired flexibility   Rehab Potential Good   Clinical Impairments Affecting Rehab Potential None   PT Frequency 2x / week   PT Duration 8 weeks   PT Treatment/Interventions ADLs/Self Care Home Management;Cryotherapy;Electrical Stimulation;Gait training;Ultrasound;Moist Heat;Iontophoresis 4mg /ml Dexamethasone;Stair training;Therapeutic activities;Functional mobility training;Therapeutic exercise;Neuromuscular re-education;Manual techniques;Patient/family education;Orthotic Fit/Training;Passive range of motion;Dry needling   PT Next Visit Plan See how pain was after doing foam roll work.   Consulted and Agree with Plan of Care Patient        Problem List Patient Active Problem List   Diagnosis Date Noted  . Essential tremor 03/02/2015  . Subacromial bursitis 03/02/2015  . Resting tremor 02/01/2015  . Epigastric fullness 10/12/2014  . Early satiety 10/12/2014  . Cystocele 12/30/2013  . IBS (irritable bowel syndrome) 09/30/2013  . GERD (gastroesophageal reflux disease) 06/27/2013  . PVC's (premature ventricular contractions) 10/02/2012  . Depressive disorder, not elsewhere classified 10/02/2012  . H. pylori infection 08/01/2012  . Bradycardia 08/01/2012  . Fatigue  08/01/2012  . Anxiety state, unspecified 05/13/2012  . Osteopenia 05/13/2012  . Hypothyroidism 05/13/2012    Camdan Burdi, PTA 03/15/2015, 1:02 PM  [facility] [address] [place], [place], [address] Phone: [phone]   Fax:  [phone]

## 2015-03-17 ENCOUNTER — Ambulatory Visit: Payer: Medicare Other | Admitting: Physical Therapy

## 2015-03-17 ENCOUNTER — Encounter: Payer: Self-pay | Admitting: Physical Therapy

## 2015-03-17 DIAGNOSIS — M7631 Iliotibial band syndrome, right leg: Secondary | ICD-10-CM | POA: Diagnosis not present

## 2015-03-17 DIAGNOSIS — M25661 Stiffness of right knee, not elsewhere classified: Secondary | ICD-10-CM

## 2015-03-17 DIAGNOSIS — M79604 Pain in right leg: Secondary | ICD-10-CM | POA: Diagnosis not present

## 2015-03-17 DIAGNOSIS — M79605 Pain in left leg: Secondary | ICD-10-CM | POA: Diagnosis not present

## 2015-03-17 DIAGNOSIS — M25561 Pain in right knee: Secondary | ICD-10-CM | POA: Diagnosis not present

## 2015-03-17 NOTE — Therapy (Signed)
[facility] [address] [place], [place], [address] Phone: [phone]   Fax:  [phone]  Physical Therapy Treatment  Patient Details  Name: Tracy Malone MRN: [id number] Date of Birth: 10-14-1931 Referring Provider:  Gavin Pound, MD  Encounter Date: 03/17/2015      PT End of Session - 03/17/15 1243    Visit Number 9   Number of Visits 10  Medicare   Date for PT Re-Evaluation 04/12/15   PT Start Time 1230   PT Stop Time 1310   PT Time Calculation (min) 40 min   Activity Tolerance Patient tolerated treatment well   Behavior During Therapy [facility] for tasks assessed/performed      Past Medical History  Diagnosis Date  . Depression     treated in the past for years;stopped in 2010 for a years  . Panic attack   . Claustrophobia   . Fibromyalgia   . IBS (irritable bowel syndrome)     Dr. Benson Norway  . Glaucoma, narrow-angle     s/p laser surgery  . Duodenal ulcer 1962    h/o  . Hyperthyroidism     h/o x 2 years  . Shingles 1999    h/o  . Hypothyroid 9/08  . GERD (gastroesophageal reflux disease)   . Trochanteric bursitis 12/2008    bilateral  . Superficial thrombophlebitis 03/2009    RLE  . Bell's palsy 1966    ? right side facial droop  . Osteoporosis 10/11    Dr.Hawkes  . SLE (systemic lupus erythematosus)     with arthralgia/myalgia, positive ANA centromere pattern, positive DNA, positive RNP, indeterminant anti-cardiolipin antibody-Dr.Hawkes.  . Carotid artery disease 2010    on vascular screening;unchanged 2013.(could not tolerate simvastatin, no other statins tried)--<30% blockage bilat 07/2011  . Recurrent UTI     has cystocele-Dr.Grewal  . Chronic fatigue and malaise   . Chronic abdominal pain   . 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 Ocular migraine     Past Surgical History  Procedure Laterality Date  . Tonsillectomy  1946  . Vaginal  hysterectomy  1971    and bladder repair.  Still has ovaries  . Cataract extraction, bilateral  1995, 1996  . Thyroidectomy, partial  09/2005    L nodule; Dr. Harlow Asa  . Upper gi endoscopy  06/27/12  . Flexible sigmoidoscopy    . Abdominal hysterectomy      There were no vitals filed for this visit.  Visit Diagnosis:  Right anterior knee pain  Iliotibial band syndrome, right  Knee stiffness, right      Subjective Assessment - 03/17/15 1242    Subjective After last wednesday I had increased pain due to doing increased reps.    Limitations Sitting   How long can you sit comfortably? 25 - 30 minutes    How long can you stand comfortably? No issues    How long can you walk comfortably? 15-20 minutes; knee begins to feel tight and feels swelling    Diagnostic tests MRI - normal - see imaging    Patient Stated Goals reduce pain and be able to straighten out leg without pain, getting in/out of the car, sitting for 45 minutes, walking for 30 minutes at a time    Currently in Pain? Yes   Pain Score 5    Pain Location Knee   Pain Orientation Right   Pain Descriptors / Indicators Dull  Pain Type Chronic pain   Pain Onset More than a month ago   Pain Frequency Intermittent   Aggravating Factors  overdoing my exercises   Pain Relieving Factors ice, meds, rest            [facility] PT Assessment - 03/17/15 0001    AROM   Right/Left Knee Right   Right Knee Extension -8  supine   Strength   Right Knee Flexion 3+/5   Right Knee Extension 4-/5                     [facility] Adult PT Treatment/Exercise - 03/17/15 0001    Knee/Hip Exercises: Aerobic   Nustep L2 x 10 min   Knee/Hip Exercises: Seated   Long Arc Quad Strengthening;Right;10 reps;3 sets;Weights   Long Arc Quad Weight 2 lbs.   Knee/Hip Exercises: Supine   Short Arc Quad Sets Strengthening;Right;3 sets;10 reps  2#   Heel Slides Right;10 reps   Manual Therapy   Manual Therapy Soft tissue mobilization   Soft  tissue mobilization posterior right knee in hamsting and gastroc                PT Education - 03/17/15 1242    Education provided No          PT Short Term Goals - 03/17/15 1246    PT SHORT TERM GOAL #1   Title Independent with HEP    Time 4   Period Weeks   Status Achieved   PT SHORT TERM GOAL #2   Title Report 30% decrease in Rt. knee pain with sitting and knitting activities    Time 4   Period Weeks   Status Achieved   PT SHORT TERM GOAL #3   Title Report 30% decrease in Rt knee pain with getting in and out of car    Time 4   Period Weeks   Status Achieved   PT SHORT TERM GOAL #4   Title Improve AROM knee ROM to -10 degrees on Rt. knee for improved gait mechanics    Period Weeks   Status Achieved  -8 degrees           PT Long Term Goals - 03/17/15 1249    PT LONG TERM GOAL #1   Title Independent with advanced HEP    Time 8   Period Weeks   Status On-going  still learning exercises   PT LONG TERM GOAL #2   Title Improve Rt. hamstring strength to 4+/5 to improve walking ability    Time 8   Period Weeks   Status On-going  4/5   PT LONG TERM GOAL #3   Title Increase walking time to 45 minutes without being limited by Rt. knee pain    Time 8   Period Weeks   Status Achieved   PT LONG TERM GOAL #4   Title Improve AROM to > or = -5 degrees to improve gait mechanics    Time 8   Period Weeks   Status On-going  -8 degrees   PT LONG TERM GOAL #5   Title Report 70% improvement in Rt. knee pain with sitting activities    Time 8   Period Weeks   Status Achieved  90%   PT LONG TERM GOAL #6   Title Improve FOTO score limitation to < or = 49%    Time 8   Period Weeks   Status On-going  Plan - 03/17/15 1303    Clinical Impression Statement Patient has met all of her STG's.  Patient has met LTG # 3. Patient has increased right knee extension AROM to -8 degrees and do LAG without pain.  Right knee flexion strength has improved  to 4-/5.  Patient reports her right knee pain has decreased by 90%.  Patient has tenderness located in posterior right knee.  Patient would benefit from inreasing right knee strength and extension ROM.    Pt will benefit from skilled therapeutic intervention in order to improve on the following deficits Abnormal gait;Pain;Increased muscle spasms;Decreased activity tolerance;Decreased endurance;Decreased strength;Difficulty walking;Impaired flexibility   Rehab Potential Good   Clinical Impairments Affecting Rehab Potential None   PT Frequency 2x / week   PT Duration 8 weeks   PT Treatment/Interventions ADLs/Self Care Home Management;Cryotherapy;Electrical Stimulation;Gait training;Ultrasound;Moist Heat;Iontophoresis 23m/ml Dexamethasone;Stair training;Therapeutic activities;Functional mobility training;Therapeutic exercise;Neuromuscular re-education;Manual techniques;Patient/family education;Orthotic Fit/Training;Passive range of motion;Dry needling   PT Next Visit Plan Measure right hip strength; work on right knee ROM and strength; G-code and progress note   PT Home Exercise Plan progress as needed   Consulted and Agree with Plan of Care Patient        Problem List Patient Active Problem List   Diagnosis Date Noted  . Essential tremor 03/02/2015  . Subacromial bursitis 03/02/2015  . Resting tremor 02/01/2015  . Epigastric fullness 10/12/2014  . Early satiety 10/12/2014  . Cystocele 12/30/2013  . IBS (irritable bowel syndrome) 09/30/2013  . GERD (gastroesophageal reflux disease) 06/27/2013  . PVC's (premature ventricular contractions) 10/02/2012  . Depressive disorder, not elsewhere classified 10/02/2012  . H. pylori infection 08/01/2012  . Bradycardia 08/01/2012  . Fatigue 08/01/2012  . Anxiety state, unspecified 05/13/2012  . Osteopenia 05/13/2012  . Hypothyroidism 05/13/2012    GRAY,CHERYL,PT 03/17/2015, 1:07 PM  [facility] [address] [place] [address]Phone: [phone]  Fax:  [phone]

## 2015-03-22 ENCOUNTER — Encounter: Payer: Self-pay | Admitting: Physical Therapy

## 2015-03-22 ENCOUNTER — Ambulatory Visit: Payer: Medicare Other | Admitting: Physical Therapy

## 2015-03-22 DIAGNOSIS — M25661 Stiffness of right knee, not elsewhere classified: Secondary | ICD-10-CM

## 2015-03-22 DIAGNOSIS — M25561 Pain in right knee: Secondary | ICD-10-CM

## 2015-03-22 DIAGNOSIS — M79604 Pain in right leg: Secondary | ICD-10-CM | POA: Diagnosis not present

## 2015-03-22 DIAGNOSIS — M7631 Iliotibial band syndrome, right leg: Secondary | ICD-10-CM | POA: Diagnosis not present

## 2015-03-22 DIAGNOSIS — M79605 Pain in left leg: Secondary | ICD-10-CM

## 2015-03-22 NOTE — Therapy (Addendum)
[facility] [address], [address] [place], [place], [address] Phone: [phone]   Fax:  [phone]  Physical Therapy Treatment  Patient Details  Name: Tracy Malone MRN: [id number] Date of Birth: 08/12/1931 Referring Provider:  Zenovia Jordan, MD  Encounter Date: 03/22/2015      PT End of Session - 03/22/15 1241    Visit Number 10   Number of Visits 10   Date for PT Re-Evaluation 04/12/15   PT Start Time 1228   PT Stop Time 1310   PT Time Calculation (min) 42 min   Activity Tolerance Patient tolerated treatment well   Behavior During Therapy [facility] for tasks assessed/performed      Past Medical History  Diagnosis Date  . Depression     treated in the past for years;stopped in 2010 for a years  . Panic attack   . Claustrophobia   . Fibromyalgia   . IBS (irritable bowel syndrome)     Dr. Elnoria Howard  . Glaucoma, narrow-angle     s/p laser surgery  . Duodenal ulcer 1962    h/o  . Hyperthyroidism     h/o x 2 years  . Shingles 1999    h/o  . Hypothyroid 9/08  . GERD (gastroesophageal reflux disease)   . Trochanteric bursitis 12/2008    bilateral  . Superficial thrombophlebitis 03/2009    RLE  . Bell's palsy 1966    ? right side facial droop  . Osteoporosis 10/11    Dr.Hawkes  . SLE (systemic lupus erythematosus)     with arthralgia/myalgia, positive ANA centromere pattern, positive DNA, positive RNP, indeterminant anti-cardiolipin antibody-Dr.Hawkes.  . Carotid artery disease 2010    on vascular screening;unchanged 2013.(could not tolerate simvastatin, no other statins tried)--<30% blockage bilat 07/2011  . Recurrent UTI     has cystocele-Dr.Grewal  . Chronic fatigue and malaise   . Chronic abdominal pain   . 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 Ocular migraine     Past Surgical History  Procedure Laterality Date  . Tonsillectomy  1946  . Vaginal hysterectomy  1971     and bladder repair.  Still has ovaries  . Cataract extraction, bilateral  1995, 1996  . Thyroidectomy, partial  09/2005    L nodule; Dr. Gerrit Friends  . Upper gi endoscopy  06/27/12  . Flexible sigmoidoscopy    . Abdominal hysterectomy      There were no vitals filed for this visit.  Visit Diagnosis:  Right anterior knee pain  Iliotibial band syndrome, right  Knee stiffness, right  Pain of left lower extremity  Pain of right lower extremity      Subjective Assessment - 03/22/15 1239    Subjective Pt continues to complain of increased pain in her right knee, esspecially in the morning and with bending            [facility] PT Assessment - 03/22/15 0001    Assessment   Medical Diagnosis Rt Knee pain    Onset Date/Surgical Date 08/17/14   Prior Therapy Previously had pelvic floor PT    Precautions   Precautions Other (comment)   Restrictions   Weight Bearing Restrictions No   Balance Screen   Has the patient fallen in the past 6 months No   Has the patient had a decrease in activity level because of a fear of falling?  No   Is the patient reluctant to leave  their home because of a fear of falling?  No   Home Tourist information centre manager residence   Home Access Stairs to enter   Home Layout Two level   Prior Function   Level of Independence Independent with basic ADLs   Vocation Retired   Pension scheme manager, Dance movement psychotherapist puzzles, crafts    Cognition   Overall Cognitive Status Within Functional Limits for tasks assessed   Observation/Other Assessments   Focus on Therapeutic Outcomes (FOTO)  64%   Observation/Other Assessments-Edema    Edema Circumferential  left knee 13, Rt knee 13.5        ROM / Strength   AROM / PROM / Strength AROM   AROM   Overall AROM  Deficits   AROM Assessment Site Knee   Right/Left Knee Right   Right Knee Extension -7   Right Knee Flexion 125   Left Knee Extension 0   Left Knee Flexion --  WNL   PROM   Overall PROM  Deficits    Overall PROM Comments Passive rt. knee extension/flexion limited by pain    Strength   Overall Strength Deficits   Right/Left Hip Right   Right Hip Flexion 4+/5   Left Hip Flexion 4+/5   Right/Left Knee Left;Right   Right Knee Flexion 3+/5   Right Knee Extension 3+/5   Left Knee Flexion 4+/5   Left Knee Extension 4+/5   Right/Left Ankle Right;Left   Right Ankle Dorsiflexion 4+/5   Left Ankle Dorsiflexion 4+/5   Palpation   Patella mobility WNL    Palpation comment 10% limited in SLR Rt compared to Lt   Ambulation/Gait   Ambulation/Gait Yes   Ambulation/Gait Assistance 7: Independent   Ambulation Distance (Feet) 40 Feet   Gait Pattern Decreased stance time - right;Decreased hip/knee flexion - right;Antalgic;Poor foot clearance - right                     [facility] Adult PT Treatment/Exercise - 03/22/15 0001    Knee/Hip Exercises: Stretches   Active Hamstring Stretch Right;3 reps;20 seconds  in supine using strap   Knee/Hip Exercises: Aerobic   Nustep L2 x 10 min  finished one lab today   Knee/Hip Exercises: Seated   Long Arc Quad Strengthening;Right;10 reps;3 sets;Weights   Long Arc Quad Weight 2 lbs.   Knee/Hip Exercises: Supine   Heel Slides 10 reps;20 reps   Manual Therapy   Manual Therapy Soft tissue mobilization   Soft tissue mobilization posterior right knee in hamsting and gastroc                  PT Short Term Goals - 03/22/15 1245    PT SHORT TERM GOAL #1   Title Independent with HEP    Time 4   Period Weeks   Status Achieved   PT SHORT TERM GOAL #2   Title Report 30% decrease in Rt. knee pain with sitting and knitting activities    Time 4   Period Weeks   Status Achieved   PT SHORT TERM GOAL #3   Title Report 30% decrease in Rt knee pain with getting in and out of car    Time 4   Period Weeks   Status Achieved   PT SHORT TERM GOAL #4   Title Improve AROM knee ROM to -10 degrees on Rt. knee for improved gait mechanics    Time 4    Period Weeks   Status Achieved  PT Long Term Goals - 2015-04-20 1246    PT LONG TERM GOAL #1   Title Independent with advanced HEP    Time 8   Period Weeks   Status On-going   PT LONG TERM GOAL #2   Title Improve Rt. hamstring strength to 4+/5 to improve walking ability    Time 8   Period Weeks   Status On-going   PT LONG TERM GOAL #4   Title Improve AROM to > or = -5 degrees to improve gait mechanics    Time 8   Period Weeks   Status On-going   PT LONG TERM GOAL #5   Title Report 70% improvement in Rt. knee pain with sitting activities    Time 8   Period Weeks   Status Achieved   PT LONG TERM GOAL #6   Title Improve FOTO score limitation to < or = 49%   as of 2023-04-20 64%, however, pt repoorts feeling 90% improvement since start of care   Time 8   Period Weeks   Status On-going               Plan - 04/20/15 1243    Clinical Impression Statement Patient is limited in her activity level due to pain in Rt knee limits daily activities. AROM in degrees Rt knee ext -7, flexion 120, Strength knee ext and flexion 4+/5. Pt with palpaple tenederness hamstrings distal insertion.Pt will continue to benefit from skilled Pt   Pt will benefit from skilled therapeutic intervention in order to improve on the following deficits Abnormal gait;Pain;Increased muscle spasms;Decreased activity tolerance;Decreased endurance;Decreased strength;Difficulty walking;Impaired flexibility   Rehab Potential Good   PT Frequency 2x / week   PT Duration 8 weeks   PT Treatment/Interventions ADLs/Self Care Home Management;Cryotherapy;Electrical Stimulation;Gait training;Ultrasound;Moist Heat;Iontophoresis 4mg /ml Dexamethasone;Stair training;Therapeutic activities;Functional mobility training;Therapeutic exercise;Neuromuscular re-education;Manual techniques;Patient/family education;Orthotic Fit/Training;Passive range of motion;Dry needling   PT Next Visit Plan Continue with hamstrings  stretch and softtissue work, and strength   PT Home Exercise Plan progress as needed   Consulted and Agree with Plan of Care Patient          G-Codes - 2015-04-20 1401    Functional Assessment Tool Used FOTO: 64% limitation   Functional Limitation Mobility: Walking and moving around   Mobility: Walking and Moving Around Current Status [phone]) At least 60 percent but less than 80 percent impaired, limited or restricted   Mobility: Walking and Moving Around Goal Status [phone]) At least 40 percent but less than 60 percent impaired, limited or restricted      Problem List Patient Active Problem List   Diagnosis Date Noted  . Essential tremor 03/02/2015  . Subacromial bursitis 03/02/2015  . Resting tremor 02/01/2015  . Epigastric fullness 10/12/2014  . Early satiety 10/12/2014  . Cystocele 12/30/2013  . IBS (irritable bowel syndrome) 09/30/2013  . GERD (gastroesophageal reflux disease) 06/27/2013  . PVC's (premature ventricular contractions) 10/02/2012  . Depressive disorder, not elsewhere classified 10/02/2012  . H. pylori infection 08/01/2012  . Bradycardia 08/01/2012  . Fatigue 08/01/2012  . Anxiety state, unspecified 05/13/2012  . Osteopenia 05/13/2012  . Hypothyroidism 05/13/2012    Ching Rabideau Naumann-Houegnifio, PTA 20-Apr-2015 2:02 PM Physical Therapy Progress Note  Dates of Reporting Period: 02/15/15  to 04-20-15  Objective Reports of Subjective Statement: Pt reports 90% overall improvement since the start of care.  Objective Measurements: See above  Goal Update: See above  Plan: Continue to treat Rt knee pain with modalities, manual, and flexibility  exercises and improve strength with exercises.  Reason Skilled Services are Required: Pt with continued functional limitation due to Rt knee pain.  FOTO is 64% limitation.  Pt will benefit from skilled PT to improve strength and reduce pain to improve functional standing tolerance.   Lorrene Reid, PT 03/22/2015 2:07  PM  [facility] [address], [address] [place], [place], [address] Phone: [phone]   Fax:  [phone]

## 2015-03-24 ENCOUNTER — Ambulatory Visit: Payer: Medicare Other

## 2015-03-24 DIAGNOSIS — M25561 Pain in right knee: Secondary | ICD-10-CM | POA: Diagnosis not present

## 2015-03-24 DIAGNOSIS — M79604 Pain in right leg: Secondary | ICD-10-CM | POA: Diagnosis not present

## 2015-03-24 DIAGNOSIS — M25661 Stiffness of right knee, not elsewhere classified: Secondary | ICD-10-CM

## 2015-03-24 DIAGNOSIS — M7631 Iliotibial band syndrome, right leg: Secondary | ICD-10-CM | POA: Diagnosis not present

## 2015-03-24 DIAGNOSIS — M79605 Pain in left leg: Secondary | ICD-10-CM | POA: Diagnosis not present

## 2015-03-24 NOTE — Therapy (Signed)
[facility] [address], [address] [place], [place], [address] Phone: [phone]   Fax:  [phone]  Physical Therapy Treatment  Patient Details  Name: Tracy Malone MRN: [id number] Date of Birth: 02-26-1932 Referring Provider:  Zenovia Jordan, MD  Encounter Date: 03/24/2015      PT End of Session - 03/24/15 1249    Visit Number 11   Number of Visits 20   Date for PT Re-Evaluation 04/12/15   PT Start Time 1229   PT Stop Time 1303   PT Time Calculation (min) 34 min   Activity Tolerance Patient tolerated treatment well   Behavior During Therapy [facility] for tasks assessed/performed      Past Medical History  Diagnosis Date  . Depression     treated in the past for years;stopped in 2010 for a years  . Panic attack   . Claustrophobia   . Fibromyalgia   . IBS (irritable bowel syndrome)     Dr. Elnoria Howard  . Glaucoma, narrow-angle     s/p laser surgery  . Duodenal ulcer 1962    h/o  . Hyperthyroidism     h/o x 2 years  . Shingles 1999    h/o  . Hypothyroid 9/08  . GERD (gastroesophageal reflux disease)   . Trochanteric bursitis 12/2008    bilateral  . Superficial thrombophlebitis 03/2009    RLE  . Bell's palsy 1966    ? right side facial droop  . Osteoporosis 10/11    Dr.Hawkes  . SLE (systemic lupus erythematosus)     with arthralgia/myalgia, positive ANA centromere pattern, positive DNA, positive RNP, indeterminant anti-cardiolipin antibody-Dr.Hawkes.  . Carotid artery disease 2010    on vascular screening;unchanged 2013.(could not tolerate simvastatin, no other statins tried)--<30% blockage bilat 07/2011  . Recurrent UTI     has cystocele-Dr.Grewal  . Chronic fatigue and malaise   . Chronic abdominal pain   . Frequent PVCs 07/2012    Seen by [facility]: benign, asymptomatic, normal EF  . Ocular migraine     Past Surgical History  Procedure Laterality Date  . Tonsillectomy  1946  . Vaginal hysterectomy  1971     and bladder repair.  Still has ovaries  . Cataract extraction, bilateral  1995, 1996  . Thyroidectomy, partial  09/2005    L nodule; Dr. Gerrit Friends  . Upper gi endoscopy  06/27/12  . Flexible sigmoidoscopy    . Abdominal hysterectomy      There were no vitals filed for this visit.  Visit Diagnosis:  Right anterior knee pain  Knee stiffness, right  Iliotibial band syndrome, right      Subjective Assessment - 03/24/15 1233    Subjective Pt reports that things aren't much better or worse today.     Currently in Pain? Yes   Pain Score 6    Pain Location Knee   Pain Orientation Right   Pain Descriptors / Indicators Dull   Pain Type Chronic pain   Pain Onset More than a month ago                         [facility] Adult PT Treatment/Exercise - 03/24/15 0001    Knee/Hip Exercises: Stretches   Active Hamstring Stretch Right;3 reps;20 seconds  in supine using strap   Knee/Hip Exercises: Aerobic   Nustep L2 x 10 min  slow pace today   Knee/Hip Exercises: Seated   Long Arc Quad Strengthening;Right;10 reps;3 sets;Weights  Long Arc Quad Weight 2 lbs.   Knee/Hip Exercises: Supine   Short Arc Quad Sets Strengthening;Right;3 sets;10 reps   Heel Slides 10 reps;20 reps   Ultrasound   Ultrasound Location Rt medial knee joint   Ultrasound Parameters 100% 1.0 w/cm2 x 8 minutes   Ultrasound Goals Pain                  PT Short Term Goals - 03/22/15 1245    PT SHORT TERM GOAL #1   Title Independent with HEP    Time 4   Period Weeks   Status Achieved   PT SHORT TERM GOAL #2   Title Report 30% decrease in Rt. knee pain with sitting and knitting activities    Time 4   Period Weeks   Status Achieved   PT SHORT TERM GOAL #3   Title Report 30% decrease in Rt knee pain with getting in and out of car    Time 4   Period Weeks   Status Achieved   PT SHORT TERM GOAL #4   Title Improve AROM knee ROM to -10 degrees on Rt. knee for improved gait mechanics    Time  4   Period Weeks   Status Achieved           PT Long Term Goals - 03/22/15 1246    PT LONG TERM GOAL #1   Title Independent with advanced HEP    Time 8   Period Weeks   Status On-going   PT LONG TERM GOAL #2   Title Improve Rt. hamstring strength to 4+/5 to improve walking ability    Time 8   Period Weeks   Status On-going   PT LONG TERM GOAL #4   Title Improve AROM to > or = -5 degrees to improve gait mechanics    Time 8   Period Weeks   Status On-going   PT LONG TERM GOAL #5   Title Report 70% improvement in Rt. knee pain with sitting activities    Time 8   Period Weeks   Status Achieved   PT LONG TERM GOAL #6   Title Improve FOTO score limitation to < or = 49%   as of Aug 29,2016 64%, however, pt repoorts feeling 90% improvement since start of care   Time 8   Period Weeks   Status On-going               Plan - 03/24/15 1246    Clinical Impression Statement Pt with incresaed pain today with 6-7/10 Rt knee pain reported.  Pt is limited in activity including driving and walking due to pain.  Pt reports that ultrasound helps the pain.  Pt will benefit from PT for pain management, flexibiity and strength for the Rt knee.     Pt will benefit from skilled therapeutic intervention in order to improve on the following deficits Abnormal gait;Pain;Increased muscle spasms;Decreased activity tolerance;Decreased endurance;Decreased strength;Difficulty walking;Impaired flexibility   Rehab Potential Good   PT Frequency 2x / week   PT Duration 8 weeks   PT Treatment/Interventions ADLs/Self Care Home Management;Cryotherapy;Electrical Stimulation;Gait training;Ultrasound;Moist Heat;Iontophoresis 4mg /ml Dexamethasone;Stair training;Therapeutic activities;Functional mobility training;Therapeutic exercise;Neuromuscular re-education;Manual techniques;Patient/family education;Orthotic Fit/Training;Passive range of motion;Dry needling   PT Next Visit Plan Continue with hamstrings  stretch and softtissue work, and Barrister's [employer] and Agree with Plan of Care Patient        Problem List Patient Active Problem List   Diagnosis Date Noted  . Essential  tremor 03/02/2015  . Subacromial bursitis 03/02/2015  . Resting tremor 02/01/2015  . Epigastric fullness 10/12/2014  . Early satiety 10/12/2014  . Cystocele 12/30/2013  . IBS (irritable bowel syndrome) 09/30/2013  . GERD (gastroesophageal reflux disease) 06/27/2013  . PVC's (premature ventricular contractions) 10/02/2012  . Depressive disorder, not elsewhere classified 10/02/2012  . H. pylori infection 08/01/2012  . Bradycardia 08/01/2012  . Fatigue 08/01/2012  . Anxiety state, unspecified 05/13/2012  . Osteopenia 05/13/2012  . Hypothyroidism 05/13/2012    Cassiel Fernandez, PT 03/24/2015, 1:05 PM  [facility] [address] [place], [place], [address] Phone: [phone]   Fax:  [phone]

## 2015-03-25 DIAGNOSIS — F419 Anxiety disorder, unspecified: Secondary | ICD-10-CM | POA: Diagnosis not present

## 2015-03-25 DIAGNOSIS — L659 Nonscarring hair loss, unspecified: Secondary | ICD-10-CM | POA: Diagnosis not present

## 2015-03-25 DIAGNOSIS — G4700 Insomnia, unspecified: Secondary | ICD-10-CM | POA: Diagnosis not present

## 2015-03-25 DIAGNOSIS — R454 Irritability and anger: Secondary | ICD-10-CM | POA: Diagnosis not present

## 2015-03-25 DIAGNOSIS — R5383 Other fatigue: Secondary | ICD-10-CM | POA: Diagnosis not present

## 2015-03-25 DIAGNOSIS — Z9889 Other specified postprocedural states: Secondary | ICD-10-CM | POA: Diagnosis not present

## 2015-03-25 DIAGNOSIS — E039 Hypothyroidism, unspecified: Secondary | ICD-10-CM | POA: Diagnosis not present

## 2015-03-25 DIAGNOSIS — E063 Autoimmune thyroiditis: Secondary | ICD-10-CM | POA: Diagnosis not present

## 2015-03-30 ENCOUNTER — Telehealth: Payer: Self-pay | Admitting: *Deleted

## 2015-03-30 ENCOUNTER — Other Ambulatory Visit: Payer: Self-pay | Admitting: Internal Medicine

## 2015-03-30 DIAGNOSIS — A048 Other specified bacterial intestinal infections: Secondary | ICD-10-CM

## 2015-03-30 NOTE — Telephone Encounter (Signed)
I placed an order for Helicobacter pylori antigen on a stool specimen.

## 2015-03-30 NOTE — Telephone Encounter (Signed)
Pt to ccome to [facility] 9,7/16 to Pick up stool kit from [place]

## 2015-03-30 NOTE — Telephone Encounter (Signed)
Pt complaining of "not eating well" and abdominal swelling.  These are the same type of symptoms she was having prior to the H.Pylori diagnosis.  She is wondering if there is a lab/stool test that can be done to check whether the H. Pylori has returned.  RN to return call after consulting with MD.

## 2015-03-31 ENCOUNTER — Telehealth: Payer: Self-pay

## 2015-03-31 NOTE — Telephone Encounter (Signed)
Pt came today and picked up the stool kit.

## 2015-03-31 NOTE — Telephone Encounter (Signed)
Records sent to [facility] on 03/31/2015

## 2015-04-01 ENCOUNTER — Other Ambulatory Visit: Payer: Self-pay

## 2015-04-01 ENCOUNTER — Ambulatory Visit: Payer: Medicare Other | Attending: Rheumatology | Admitting: Physical Therapy

## 2015-04-01 ENCOUNTER — Encounter: Payer: Self-pay | Admitting: Physical Therapy

## 2015-04-01 DIAGNOSIS — M25561 Pain in right knee: Secondary | ICD-10-CM | POA: Insufficient documentation

## 2015-04-01 DIAGNOSIS — M25661 Stiffness of right knee, not elsewhere classified: Secondary | ICD-10-CM | POA: Diagnosis not present

## 2015-04-01 DIAGNOSIS — M7631 Iliotibial band syndrome, right leg: Secondary | ICD-10-CM | POA: Diagnosis not present

## 2015-04-01 DIAGNOSIS — M79604 Pain in right leg: Secondary | ICD-10-CM

## 2015-04-01 NOTE — Therapy (Signed)
[facility] [address], [address] [place], [place], [address] Phone: [phone]   Fax:  [phone]  Physical Therapy Treatment  Patient Details  Name: Tracy Malone MRN: [id number] Date of Birth: 07-07-1932 Referring Provider:  Zenovia Jordan, MD  Encounter Date: 04/01/2015      PT End of Session - 04/01/15 1242    Visit Number 12   Number of Visits 20   Date for PT Re-Evaluation 04/12/15   PT Start Time 1229   PT Stop Time 1314   PT Time Calculation (min) 45 min   Activity Tolerance Patient tolerated treatment well   Behavior During Therapy [facility] for tasks assessed/performed      Past Medical History  Diagnosis Date  . Depression     treated in the past for years;stopped in 2010 for a years  . Panic attack   . Claustrophobia   . Fibromyalgia   . IBS (irritable bowel syndrome)     Dr. Elnoria Howard  . Glaucoma, narrow-angle     s/p laser surgery  . Duodenal ulcer 1962    h/o  . Hyperthyroidism     h/o x 2 years  . Shingles 1999    h/o  . Hypothyroid 9/08  . GERD (gastroesophageal reflux disease)   . Trochanteric bursitis 12/2008    bilateral  . Superficial thrombophlebitis 03/2009    RLE  . Bell's palsy 1966    ? right side facial droop  . Osteoporosis 10/11    Dr.Hawkes  . SLE (systemic lupus erythematosus)     with arthralgia/myalgia, positive ANA centromere pattern, positive DNA, positive RNP, indeterminant anti-cardiolipin antibody-Dr.Hawkes.  . Carotid artery disease 2010    on vascular screening;unchanged 2013.(could not tolerate simvastatin, no other statins tried)--<30% blockage bilat 07/2011  . Recurrent UTI     has cystocele-Dr.Grewal  . Chronic fatigue and malaise   . Chronic abdominal pain   . 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 Ocular migraine     Past Surgical History  Procedure Laterality Date  . Tonsillectomy  1946  . Vaginal hysterectomy  1971     and bladder repair.  Still has ovaries  . Cataract extraction, bilateral  1995, 1996  . Thyroidectomy, partial  09/2005    L nodule; Dr. Gerrit Friends  . Upper gi endoscopy  06/27/12  . Flexible sigmoidoscopy    . Abdominal hysterectomy      There were no vitals filed for this visit.  Visit Diagnosis:  Right anterior knee pain  Knee stiffness, right  Iliotibial band syndrome, right  Pain of right lower extremity      Subjective Assessment - 04/01/15 1235    Subjective My knee is pretty stiff in the morning, it is still swelling in that right knee on the outside   Currently in Pain? No/denies                         [facility] Adult PT Treatment/Exercise - 04/01/15 0001    Knee/Hip Exercises: Stretches   Active Hamstring Stretch Right;3 reps;20 seconds  supine using strap   Knee/Hip Exercises: Aerobic   Nustep L2 x 10 min  st# 5, arms #9, incr pace able to complete 1 lab, 0.57miles   Knee/Hip Exercises: Seated   Long Arc Quad Strengthening;Right;10 reps;3 sets;Weights   Long Arc Quad Weight 2 lbs.   Knee/Hip Exercises: Supine   Short Arc  Quad Sets --   Heel Slides 10 reps;20 reps   Ultrasound   Ultrasound Location Rt medial    Ultrasound Parameters 100%, , 1Wcm   Ultrasound Goals Pain   Manual Therapy   Manual Therapy Soft tissue mobilization;Joint mobilization   Manual therapy comments fibula head ant/posterior glide, tibia ant/post lat and med grade 3   Joint Mobilization grade 3   Soft tissue mobilization gentle crossfriction distal ITB, and biceps femoris tendon                  PT Short Term Goals - 03/22/15 1245    PT SHORT TERM GOAL #1   Title Independent with HEP    Time 4   Period Weeks   Status Achieved   PT SHORT TERM GOAL #2   Title Report 30% decrease in Rt. knee pain with sitting and knitting activities    Time 4   Period Weeks   Status Achieved   PT SHORT TERM GOAL #3   Title Report 30% decrease in Rt knee pain with  getting in and out of car    Time 4   Period Weeks   Status Achieved   PT SHORT TERM GOAL #4   Title Improve AROM knee ROM to -10 degrees on Rt. knee for improved gait mechanics    Time 4   Period Weeks   Status Achieved           PT Long Term Goals - 04/01/15 1247    PT LONG TERM GOAL #1   Title Independent with advanced HEP    Time 8   Period Weeks   Status On-going   PT LONG TERM GOAL #2   Title Improve Rt. hamstring strength to 4+/5 to improve walking ability    Time 8   Period Weeks   Status On-going   PT LONG TERM GOAL #3   Title Increase walking time to 45 minutes without being limited by Rt. knee pain    Time 8   Period Weeks   Status On-going   PT LONG TERM GOAL #4   Title Improve AROM to > or = -5 degrees to improve gait mechanics    Time 8   Period Weeks   Status On-going   PT LONG TERM GOAL #5   Title Report 70% improvement in Rt. knee pain with sitting activities    Time 8   Period Weeks   Status Achieved   PT LONG TERM GOAL #6   Title Improve FOTO score limitation to < or = 49%    Time 8   Period Weeks   Status On-going               Plan - 04/01/15 1243    Clinical Impression Statement No complain of pain today, pt with increase pace on Nu-step today. Pt will continue to  benefit from skilled PT for pain managment, flexibility and strength for Rt knee   Pt will benefit from skilled therapeutic intervention in order to improve on the following deficits Abnormal gait;Pain;Increased muscle spasms;Decreased activity tolerance;Decreased endurance;Decreased strength;Difficulty walking;Impaired flexibility;Dizziness;Decreased safety awareness   Rehab Potential Good   PT Frequency 2x / week   PT Duration 8 weeks   PT Treatment/Interventions ADLs/Self Care Home Management;Cryotherapy;Electrical Stimulation;Gait training;Ultrasound;Moist Heat;Iontophoresis 4mg /ml Dexamethasone;Stair training;Therapeutic activities;Functional mobility  training;Therapeutic exercise;Neuromuscular re-education;Manual techniques;Patient/family education;Orthotic Fit/Training;Passive range of motion;Dry needling   PT Next Visit Plan Continue with strength, endurance and Ultrasound   PT Home Exercise Plan  progress as needed   Consulted and Agree with Plan of Care Patient        Problem List Patient Active Problem List   Diagnosis Date Noted  . Essential tremor 03/02/2015  . Subacromial bursitis 03/02/2015  . Resting tremor 02/01/2015  . Epigastric fullness 10/12/2014  . Early satiety 10/12/2014  . Cystocele 12/30/2013  . IBS (irritable bowel syndrome) 09/30/2013  . GERD (gastroesophageal reflux disease) 06/27/2013  . PVC's (premature ventricular contractions) 10/02/2012  . Depressive disorder, not elsewhere classified 10/02/2012  . H. pylori infection 08/01/2012  . Bradycardia 08/01/2012  . Fatigue 08/01/2012  . Anxiety state, unspecified 05/13/2012  . Osteopenia 05/13/2012  . Hypothyroidism 05/13/2012    NAUMANN-HOUEGNIFIO,Lionardo Haze PTA 04/01/2015, 1:17 PM  [facility] [address] [place], [place], [address] Phone: [phone]   Fax:  [phone]

## 2015-04-02 ENCOUNTER — Ambulatory Visit (INDEPENDENT_AMBULATORY_CARE_PROVIDER_SITE_OTHER): Payer: Medicare Other | Admitting: Licensed Clinical Social Worker

## 2015-04-02 DIAGNOSIS — F419 Anxiety disorder, unspecified: Secondary | ICD-10-CM

## 2015-04-02 NOTE — Telephone Encounter (Signed)
i think we wait to see if hpylori testing is positive.

## 2015-04-05 ENCOUNTER — Encounter: Payer: Self-pay | Admitting: Physical Therapy

## 2015-04-05 ENCOUNTER — Ambulatory Visit: Payer: Medicare Other | Admitting: Physical Therapy

## 2015-04-05 DIAGNOSIS — M25661 Stiffness of right knee, not elsewhere classified: Secondary | ICD-10-CM | POA: Diagnosis not present

## 2015-04-05 DIAGNOSIS — M7631 Iliotibial band syndrome, right leg: Secondary | ICD-10-CM | POA: Diagnosis not present

## 2015-04-05 DIAGNOSIS — M25561 Pain in right knee: Secondary | ICD-10-CM

## 2015-04-05 DIAGNOSIS — M79604 Pain in right leg: Secondary | ICD-10-CM | POA: Diagnosis not present

## 2015-04-05 NOTE — Therapy (Signed)
[facility] [address], [place], [place], [address] Phone: [phone]   Fax:  [phone]  Physical Therapy Treatment  Patient Details  Name: Tracy Malone MRN: [id number] Date of Birth: [birth year] Referring Provider:  Gavin Pound, MD  Encounter Date: 04/05/2015      PT End of Session - 04/05/15 1407    Visit Number 13   Number of Visits 20  Medicare   Date for PT Re-Evaluation 04/12/15   PT Start Time 1400   PT Stop Time 1440   PT Time Calculation (min) 40 min   Activity Tolerance Patient tolerated treatment well   Behavior During Therapy [facility] for tasks assessed/performed      Past Medical History  Diagnosis Date  . Depression     treated in the past for years;stopped in 2010 for a years  . Panic attack   . Claustrophobia   . Fibromyalgia   . IBS (irritable bowel syndrome)     Dr. Benson Norway  . Glaucoma, narrow-angle     s/p laser surgery  . Duodenal ulcer 1962    h/o  . Hyperthyroidism     h/o x 2 years  . Shingles 1999    h/o  . Hypothyroid 9/08  . GERD (gastroesophageal reflux disease)   . Trochanteric bursitis 12/2008    bilateral  . Superficial thrombophlebitis 03/2009    RLE  . Bell's palsy 1966    ? right side facial droop  . Osteoporosis 10/11    Dr.Hawkes  . SLE (systemic lupus erythematosus)     with arthralgia/myalgia, positive ANA centromere pattern, positive DNA, positive RNP, indeterminant anti-cardiolipin antibody-Dr.Hawkes.  . Carotid artery disease 2010    on vascular screening;unchanged 2013.(could not tolerate simvastatin, no other statins tried)--<30% blockage bilat 07/2011  . Recurrent UTI     has cystocele-Dr.Grewal  . Chronic fatigue and malaise   . Chronic abdominal pain   . 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 Ocular migraine     Past Surgical History  Procedure Laterality Date  . Tonsillectomy  1946  . Vaginal  hysterectomy  1971    and bladder repair.  Still has ovaries  . Cataract extraction, bilateral  1995, 1996  . Thyroidectomy, partial  09/2005    L nodule; Dr. Harlow Asa  . Upper gi endoscopy  06/27/12  . Flexible sigmoidoscopy    . Abdominal hysterectomy      There were no vitals filed for this visit.  Visit Diagnosis:  Right anterior knee pain  Knee stiffness, right  Iliotibial band syndrome, right      Subjective Assessment - 04/05/15 1408    Subjective My knee is not painful but the tendon on the side hurts.  I think I am working better.    Limitations Sitting   How long can you sit comfortably? 25 - 30 minutes    How long can you stand comfortably? No issues    How long can you walk comfortably? 15-20 minutes; knee begins to feel tight and feels swelling    Diagnostic tests MRI - normal - see imaging    Patient Stated Goals reduce pain and be able to straighten out leg without pain, getting in/out of the car, sitting for 45 minutes, walking for 30 minutes at a time    Currently in Pain? Yes   Pain Score 7    Pain Location Knee   Pain Orientation Right  Pain Descriptors / Indicators Dull   Pain Type Chronic pain   Pain Onset More than a month ago   Pain Frequency Intermittent   Aggravating Factors  overdoing my exercise   Pain Relieving Factors ice, meds, rest                         [facility] Adult PT Treatment/Exercise - 04/05/15 0001    Knee/Hip Exercises: Aerobic   Nustep L3 x 10 min  st# 5, arms #9, incr pace able to complete 1 lab, 0.69miles   Knee/Hip Exercises: Seated   Long Arc Quad Strengthening;Right;10 reps;3 sets;Weights   Long Arc Quad Weight 2 lbs.   Knee/Hip Exercises: Supine   Heel Slides Right;Strengthening;10 reps  2 pounds   Heel Slides Limitations 2 sets   Ultrasound   Ultrasound Location lateral right knee   Ultrasound Parameters 100% i mhz, 8 min, 1.2 w/cm2   Ultrasound Goals Pain   Manual Therapy   Manual Therapy Soft tissue  mobilization;Joint mobilization   Soft tissue mobilization lateral left knee, iliotibial band, lateral right fibula head                PT Education - 04/05/15 1438    Education provided No          PT Short Term Goals - 03/22/15 1245    PT SHORT TERM GOAL #1   Title Independent with HEP    Time 4   Period Weeks   Status Achieved   PT SHORT TERM GOAL #2   Title Report 30% decrease in Rt. knee pain with sitting and knitting activities    Time 4   Period Weeks   Status Achieved   PT SHORT TERM GOAL #3   Title Report 30% decrease in Rt knee pain with getting in and out of car    Time 4   Period Weeks   Status Achieved   PT SHORT TERM GOAL #4   Title Improve AROM knee ROM to -10 degrees on Rt. knee for improved gait mechanics    Time 4   Period Weeks   Status Achieved           PT Long Term Goals - 04/01/15 1247    PT LONG TERM GOAL #1   Title Independent with advanced HEP    Time 8   Period Weeks   Status On-going   PT LONG TERM GOAL #2   Title Improve Rt. hamstring strength to 4+/5 to improve walking ability    Time 8   Period Weeks   Status On-going   PT LONG TERM GOAL #3   Title Increase walking time to 45 minutes without being limited by Rt. knee pain    Time 8   Period Weeks   Status On-going   PT LONG TERM GOAL #4   Title Improve AROM to > or = -5 degrees to improve gait mechanics    Time 8   Period Weeks   Status On-going   PT LONG TERM GOAL #5   Title Report 70% improvement in Rt. knee pain with sitting activities    Time 8   Period Weeks   Status Achieved   PT LONG TERM GOAL #6   Title Improve FOTO score limitation to < or = 49%    Time 8   Period Weeks   Status On-going               Plan -  04/05/15 1438    Clinical Impression Statement Patient has pain in right distal iliotibial band level 7/10.  Patient is seeing Dr. Tonita Cong this wednesday.  Patient is able to ambulate with a more fluid gait.  Patient tolerated increased  tension on nustep. Patient will benefit from increase right knee strength.    Pt will benefit from skilled therapeutic intervention in order to improve on the following deficits Abnormal gait;Pain;Increased muscle spasms;Decreased activity tolerance;Decreased endurance;Decreased strength;Difficulty walking;Impaired flexibility;Dizziness;Decreased safety awareness   Rehab Potential Good   Clinical Impairments Affecting Rehab Potential None   PT Duration 8 weeks   PT Treatment/Interventions ADLs/Self Care Home Management;Cryotherapy;Electrical Stimulation;Gait training;Ultrasound;Moist Heat;Iontophoresis 4mg /ml Dexamethasone;Stair training;Therapeutic activities;Functional mobility training;Therapeutic exercise;Neuromuscular re-education;Manual techniques;Patient/family education;Orthotic Fit/Training;Passive range of motion;Dry needling   PT Next Visit Plan Continue with strength, endurance and Ultrasound; write MD note   PT Home Exercise Plan progress as needed   Consulted and Agree with Plan of Care Patient        Problem List Patient Active Problem List   Diagnosis Date Noted  . Essential tremor 03/02/2015  . Subacromial bursitis 03/02/2015  . Resting tremor 02/01/2015  . Epigastric fullness 10/12/2014  . Early satiety 10/12/2014  . Cystocele 12/30/2013  . IBS (irritable bowel syndrome) 09/30/2013  . GERD (gastroesophageal reflux disease) 06/27/2013  . PVC's (premature ventricular contractions) 10/02/2012  . Depressive disorder, not elsewhere classified 10/02/2012  . H. pylori infection 08/01/2012  . Bradycardia 08/01/2012  . Fatigue 08/01/2012  . Anxiety state, unspecified 05/13/2012  . Osteopenia 05/13/2012  . Hypothyroidism 05/13/2012    GRAY,CHERYL,PT 04/05/2015, 2:41 PM  [facility] [address] [place], [place], [address] Phone: [phone]   Fax:  [phone]

## 2015-04-07 ENCOUNTER — Encounter: Payer: Self-pay | Admitting: Physical Therapy

## 2015-04-07 ENCOUNTER — Ambulatory Visit: Payer: Medicare Other | Admitting: Physical Therapy

## 2015-04-07 DIAGNOSIS — M7631 Iliotibial band syndrome, right leg: Secondary | ICD-10-CM | POA: Diagnosis not present

## 2015-04-07 DIAGNOSIS — M79604 Pain in right leg: Secondary | ICD-10-CM | POA: Diagnosis not present

## 2015-04-07 DIAGNOSIS — M25561 Pain in right knee: Secondary | ICD-10-CM | POA: Diagnosis not present

## 2015-04-07 DIAGNOSIS — M1711 Unilateral primary osteoarthritis, right knee: Secondary | ICD-10-CM | POA: Diagnosis not present

## 2015-04-07 DIAGNOSIS — M24561 Contracture, right knee: Secondary | ICD-10-CM | POA: Diagnosis not present

## 2015-04-07 DIAGNOSIS — M25661 Stiffness of right knee, not elsewhere classified: Secondary | ICD-10-CM | POA: Diagnosis not present

## 2015-04-07 NOTE — Therapy (Signed)
[facility] [address] [place], [place], [address] Phone: [phone]   Fax:  [phone]  Physical Therapy Treatment  Patient Details  Name: Tracy Malone MRN: [id number] Date of Birth: 1932-01-26 Referring Provider:  Zenovia Jordan, MD  Encounter Date: 04/07/2015      PT End of Session - 04/07/15 1111    Visit Number 14   Number of Visits 20  Medicare   Date for PT Re-Evaluation 05/03/15   PT Start Time 1100   PT Stop Time 1140   PT Time Calculation (min) 40 min   Activity Tolerance Patient tolerated treatment well   Behavior During Therapy [facility] for tasks assessed/performed      Past Medical History  Diagnosis Date  . Depression     treated in the past for years;stopped in 2010 for a years  . Panic attack   . Claustrophobia   . Fibromyalgia   . IBS (irritable bowel syndrome)     Dr. Elnoria Howard  . Glaucoma, narrow-angle     s/p laser surgery  . Duodenal ulcer 1962    h/o  . Hyperthyroidism     h/o x 2 years  . Shingles 1999    h/o  . Hypothyroid 9/08  . GERD (gastroesophageal reflux disease)   . Trochanteric bursitis 12/2008    bilateral  . Superficial thrombophlebitis 03/2009    RLE  . Bell's palsy 1966    ? right side facial droop  . Osteoporosis 10/11    Dr.Hawkes  . SLE (systemic lupus erythematosus)     with arthralgia/myalgia, positive ANA centromere pattern, positive DNA, positive RNP, indeterminant anti-cardiolipin antibody-Dr.Hawkes.  . Carotid artery disease 2010    on vascular screening;unchanged 2013.(could not tolerate simvastatin, no other statins tried)--<30% blockage bilat 07/2011  . Recurrent UTI     has cystocele-Dr.Grewal  . Chronic fatigue and malaise   . Chronic abdominal pain   . Frequent PVCs 07/2012    Seen by [facility]: benign, asymptomatic, normal EF  . Ocular migraine     Past Surgical History  Procedure Laterality Date  . Tonsillectomy  1946  . Vaginal  hysterectomy  1971    and bladder repair.  Still has ovaries  . Cataract extraction, bilateral  1995, 1996  . Thyroidectomy, partial  09/2005    L nodule; Dr. Gerrit Friends  . Upper gi endoscopy  06/27/12  . Flexible sigmoidoscopy    . Abdominal hysterectomy      There were no vitals filed for this visit.  Visit Diagnosis:  Right anterior knee pain - Plan: PT plan of care cert/re-cert  Knee stiffness, right - Plan: PT plan of care cert/re-cert  Iliotibial band syndrome, right - Plan: PT plan of care cert/re-cert      Subjective Assessment - 04/07/15 1113    Subjective I feel like therapy has helped due to more motion in my knee.    Limitations Sitting   How long can you sit comfortably? 25 - 30 minutes    How long can you stand comfortably? No issues    How long can you walk comfortably? 15-20 minutes; knee begins to feel tight and feels swelling    Diagnostic tests MRI - normal - see imaging    Patient Stated Goals reduce pain and be able to straighten out leg without pain, getting in/out of the car, sitting for 45 minutes, walking for 30 minutes at a time    Currently in Pain? Yes  Pain Score 5    Pain Location Knee   Pain Orientation Right   Pain Descriptors / Indicators Aching   Pain Type Chronic pain   Pain Onset More than a month ago   Pain Frequency Intermittent   Aggravating Factors  overdoing my exercise   Pain Relieving Factors stretching, riding bike at home   Multiple Pain Sites No            [facility] PT Assessment - 04/07/15 0001    Assessment   Medical Diagnosis Rt Knee pain    Onset Date/Surgical Date 08/17/14   Prior Therapy Previously had pelvic floor PT    Precautions   Precautions Other (comment)   Restrictions   Weight Bearing Restrictions No   Balance Screen   Has the patient fallen in the past 6 months No   Has the patient had a decrease in activity level because of a fear of falling?  No   Is the patient reluctant to leave their home because of a  fear of falling?  No   Home [employer]ist information centre manager residence   Prior Function   Level of Independence Independent with basic ADLs   Cognition   Overall Cognitive Status Within Functional Limits for tasks assessed   Observation/Other Assessments   Focus on Therapeutic Outcomes (FOTO)  64%   Observation/Other Assessments-Edema    Edema Circumferential  left knee 13, Rt knee 13.5        AROM   AROM Assessment Site Knee   Right/Left Knee Right   Right Knee Extension -8   PROM   Overall PROM Comments right knee extension PROM is -8 degrees with pain   Strength   Right/Left Hip Right   Right Hip Flexion 4+/5   Right Knee Flexion 5/5   Right Knee Extension 4/5   Right Ankle Dorsiflexion 4+/5                     [facility] Adult PT Treatment/Exercise - 04/07/15 0001    Knee/Hip Exercises: Aerobic   Nustep L3 x 10 min  st# 5, arms #9, incr pace able to complete 1 lab, 0.27miles   Knee/Hip Exercises: Supine   Short Arc Quad Sets Strengthening;Right;10 reps;2 sets   Short Arc Quad Sets Limitations 3 pounds   Heel Slides Strengthening;Right;15 reps  3 #   Manual Therapy   Manual Therapy Soft tissue mobilization;Joint mobilization;Passive ROM   Joint Mobilization distraction of right knee   Soft tissue mobilization righ tiliotibial band, right lateral quad, around right fibula head   Passive ROM right knee extension                PT Education - 04/07/15 1135    Education provided No          PT Short Term Goals - 04/07/15 1115    PT SHORT TERM GOAL #1   Title Independent with HEP    Time 4   Period Weeks   Status Achieved   PT SHORT TERM GOAL #2   Time 4   Period Weeks   Status Achieved   PT SHORT TERM GOAL #3   Title Report 30% decrease in Rt knee pain with getting in and out of car    Time 4   Period Weeks   Status Achieved   PT SHORT TERM GOAL #4   Title Improve AROM knee ROM to -10 degrees on Rt. knee for improved gait  mechanics  Time 4   Period Weeks   Status Achieved           PT Long Term Goals - 04/07/15 1115    PT LONG TERM GOAL #1   Title Independent with advanced HEP    Time 8   Period Weeks   Status On-going   PT LONG TERM GOAL #2   Title Improve Rt. hamstring strength to 4+/5 to improve walking ability    Time 8   Period Weeks   Status Achieved   PT LONG TERM GOAL #3   Title Increase walking time to 45 minutes without being limited by Rt. knee pain    Time 8   Period Weeks   Status On-going  35 min.   PT LONG TERM GOAL #4   Title Improve AROM to > or = -5 degrees to improve gait mechanics    Time 8   Period Weeks   Status On-going  -8 AROM in sitting   PT LONG TERM GOAL #5   Title Report 70% improvement in Rt. knee pain with sitting activities    Time 8   Period Weeks   Status Achieved               Plan - 04/07/15 1135    Clinical Impression Statement Patient is a 81 year old female with diagnosis of right knee pain.  Right knee strength for hamstring is 5/5 and quads is 4/5.  Right hip flexion 4+/5 an dright ankle DF for 4+/5.  Right knee extension AROM in sitting is -8 degrees and PROM is -5 degrees after stretching. Patient has met her STG's.  Patietn has met LTG # 2. Patient s able to walk 35 minutes.  Patient is able to ambulate with increased knee extension.    Pt will benefit from skilled therapeutic intervention in order to improve on the following deficits Abnormal gait;Pain;Increased muscle spasms;Decreased activity tolerance;Decreased endurance;Decreased strength;Difficulty walking;Impaired flexibility;Dizziness;Decreased safety awareness   Rehab Potential Good   Clinical Impairments Affecting Rehab Potential None   PT Frequency 2x / week   PT Duration 3 weeks   PT Treatment/Interventions ADLs/Self Care Home Management;Cryotherapy;Electrical Stimulation;Gait training;Ultrasound;Moist Heat;Iontophoresis 4mg /ml Dexamethasone;Stair training;Therapeutic  activities;Functional mobility training;Therapeutic exercise;Neuromuscular re-education;Manual techniques;Patient/family education;Orthotic Fit/Training;Passive range of motion;Dry needling   PT Next Visit Plan see what MD says, work on right knee extension strength and ROM   PT Home Exercise Plan progress as needed   Consulted and Agree with Plan of Care Patient        Problem List Patient Active Problem List   Diagnosis Date Noted  . Essential tremor 03/02/2015  . Subacromial bursitis 03/02/2015  . Resting tremor 02/01/2015  . Epigastric fullness 10/12/2014  . Early satiety 10/12/2014  . Cystocele 12/30/2013  . IBS (irritable bowel syndrome) 09/30/2013  . GERD (gastroesophageal reflux disease) 06/27/2013  . PVC's (premature ventricular contractions) 10/02/2012  . Depressive disorder, not elsewhere classified 10/02/2012  . H. pylori infection 08/01/2012  . Bradycardia 08/01/2012  . Fatigue 08/01/2012  . Anxiety state, unspecified 05/13/2012  . Osteopenia 05/13/2012  . Hypothyroidism 05/13/2012    Hilberto Burzynski,PT 04/07/2015, 11:42 AM  [facility] [address] [place], [place], [address] Phone: [phone]   Fax:  [phone]

## 2015-04-08 ENCOUNTER — Encounter: Payer: Self-pay | Admitting: Family Medicine

## 2015-04-08 ENCOUNTER — Encounter: Payer: Self-pay | Admitting: Neurology

## 2015-04-08 ENCOUNTER — Other Ambulatory Visit: Payer: Medicare Other

## 2015-04-08 DIAGNOSIS — A048 Other specified bacterial intestinal infections: Secondary | ICD-10-CM

## 2015-04-08 DIAGNOSIS — B9681 Helicobacter pylori [H. pylori] as the cause of diseases classified elsewhere: Secondary | ICD-10-CM | POA: Diagnosis not present

## 2015-04-09 ENCOUNTER — Telehealth: Payer: Self-pay | Admitting: *Deleted

## 2015-04-09 LAB — HELICOBACTER PYLORI  SPECIAL ANTIGEN: H. PYLORI Antigen: NOT DETECTED

## 2015-04-09 NOTE — Telephone Encounter (Signed)
-----   Message from Michel Bickers, MD sent at 04/09/2015  9:25 AM EDT ----- Sharyn Lull,  Please let her know her H. Pylori antigen was negative. Thanks.  Jenny Reichmann

## 2015-04-09 NOTE — Telephone Encounter (Signed)
Relayed results to patient. She had no further questions. Howell, Michelle M, RN  

## 2015-04-12 ENCOUNTER — Ambulatory Visit: Payer: Medicare Other | Admitting: Rehabilitation

## 2015-04-12 ENCOUNTER — Encounter: Payer: Self-pay | Admitting: Rehabilitation

## 2015-04-12 DIAGNOSIS — M79604 Pain in right leg: Secondary | ICD-10-CM

## 2015-04-12 DIAGNOSIS — M25661 Stiffness of right knee, not elsewhere classified: Secondary | ICD-10-CM

## 2015-04-12 DIAGNOSIS — M25561 Pain in right knee: Secondary | ICD-10-CM

## 2015-04-12 DIAGNOSIS — M7631 Iliotibial band syndrome, right leg: Secondary | ICD-10-CM | POA: Diagnosis not present

## 2015-04-12 NOTE — Therapy (Signed)
[facility] [address] [place], [place], [address] Phone: [phone]   Fax:  [phone]  Physical Therapy Treatment  Patient Details  Name: Tracy Malone MRN: [id number] Date of Birth: 1931/10/02 Referring Provider:  Gavin Pound, MD  Encounter Date: 04/12/2015      PT End of Session - 04/12/15 1706    Visit Number 15   Number of Visits 20   Date for PT Re-Evaluation 05/03/15   PT Start Time 1324   PT Stop Time 1455   PT Time Calculation (min) 40 min   Activity Tolerance Patient tolerated treatment well      Past Medical History  Diagnosis Date  . Depression     treated in the past for years;stopped in 2010 for a years  . Panic attack   . Claustrophobia   . Fibromyalgia   . IBS (irritable bowel syndrome)     Dr. Benson Norway  . Glaucoma, narrow-angle     s/p laser surgery  . Duodenal ulcer 1962    h/o  . Hyperthyroidism     h/o x 2 years  . Shingles 1999    h/o  . Hypothyroid 9/08  . GERD (gastroesophageal reflux disease)   . Trochanteric bursitis 12/2008    bilateral  . Superficial thrombophlebitis 03/2009    RLE  . Bell's palsy 1966    ? right side facial droop  . Osteoporosis 10/11    Dr.Hawkes  . SLE (systemic lupus erythematosus)     with arthralgia/myalgia, positive ANA centromere pattern, positive DNA, positive RNP, indeterminant anti-cardiolipin antibody-Dr.Hawkes.  . Carotid artery disease 2010    on vascular screening;unchanged 2013.(could not tolerate simvastatin, no other statins tried)--<30% blockage bilat 07/2011  . Recurrent UTI     has cystocele-Dr.Grewal  . Chronic fatigue and malaise   . Chronic abdominal pain   . Frequent PVCs 07/2012    Seen by [facility]: benign, asymptomatic, normal EF  . Ocular migraine     Past Surgical History  Procedure Laterality Date  . Tonsillectomy  1946  . Vaginal hysterectomy  1971    and bladder repair.  Still has ovaries  . Cataract  extraction, bilateral  1995, 1996  . Thyroidectomy, partial  09/2005    L nodule; Dr. Harlow Asa  . Upper gi endoscopy  06/27/12  . Flexible sigmoidoscopy    . Abdominal hysterectomy      There were no vitals filed for this visit.  Visit Diagnosis:  Knee stiffness, right  Right anterior knee pain  Pain of right lower extremity      Subjective Assessment - 04/12/15 1614    Subjective no new complaints. R knee pain as usual   Currently in Pain? Yes   Pain Location Knee   Pain Orientation Right   Aggravating Factors  doing too much   Pain Relieving Factors stretching                         [facility] Adult PT Treatment/Exercise - 04/12/15 0001    Knee/Hip Exercises: Stretches   Other Knee/Hip Stretches prone extension hangs 30"x5   Knee/Hip Exercises: Aerobic   Nustep L3 x 10 min   Knee/Hip Exercises: Supine   Short Arc Quad Sets Strengthening;20 reps   Heel Slides Strengthening;Right;15 reps   Terminal Knee Extension 15 reps;Strengthening;Right   Theraband Level (Terminal Knee Extension) Level 3 (Green)   Manual Therapy   Manual therapy comments sidelying STM  distal ITB and lateral hamstring.  Joint mob hooklying curvilinear PAs grade III 3x30", extension mob grade IV 3x30"                PT Education - 04/12/15 1706    Education provided Yes   Education Details reviewed prone extension hangs for home given by MD   Person(s) Educated Patient   Methods Explanation;Demonstration   Comprehension Verbalized understanding;Returned demonstration          PT Short Term Goals - 04/07/15 1115    PT SHORT TERM GOAL #1   Title Independent with HEP    Time 4   Period Weeks   Status Achieved   PT SHORT TERM GOAL #2   Time 4   Period Weeks   Status Achieved   PT SHORT TERM GOAL #3   Title Report 30% decrease in Rt knee pain with getting in and out of car    Time 4   Period Weeks   Status Achieved   PT SHORT TERM GOAL #4   Title Improve AROM knee  ROM to -10 degrees on Rt. knee for improved gait mechanics    Time 4   Period Weeks   Status Achieved           PT Long Term Goals - 04/07/15 1115    PT LONG TERM GOAL #1   Title Independent with advanced HEP    Time 8   Period Weeks   Status On-going   PT LONG TERM GOAL #2   Title Improve Rt. hamstring strength to 4+/5 to improve walking ability    Time 8   Period Weeks   Status Achieved   PT LONG TERM GOAL #3   Title Increase walking time to 45 minutes without being limited by Rt. knee pain    Time 8   Period Weeks   Status On-going  35 min.   PT LONG TERM GOAL #4   Title Improve AROM to > or = -5 degrees to improve gait mechanics    Time 8   Period Weeks   Status On-going  -8 AROM in sitting   PT LONG TERM GOAL #5   Title Report 70% improvement in Rt. knee pain with sitting activities    Time 8   Period Weeks   Status Achieved               Plan - 04/12/15 1706    Clinical Impression Statement lacking 7 degrees of extension today after manual work.  continues with posterior hamstring region pain with extension.  MD wanted her to add prone extension hangs to HEP; reviewed and performed in clinic to add to HEP.     PT Next Visit Plan work on right knee extension strength and ROM        Problem List Patient Active Problem List   Diagnosis Date Noted  . Essential tremor 03/02/2015  . Subacromial bursitis 03/02/2015  . Resting tremor 02/01/2015  . Epigastric fullness 10/12/2014  . Early satiety 10/12/2014  . Cystocele 12/30/2013  . IBS (irritable bowel syndrome) 09/30/2013  . GERD (gastroesophageal reflux disease) 06/27/2013  . PVC's (premature ventricular contractions) 10/02/2012  . Depressive disorder, not elsewhere classified 10/02/2012  . H. pylori infection 08/01/2012  . Bradycardia 08/01/2012  . Fatigue 08/01/2012  . Anxiety state, unspecified 05/13/2012  . Osteopenia 05/13/2012  . Hypothyroidism 05/13/2012    Stark Bray, DPT,  CMP 04/12/2015, 5:08 PM  [facility]  [place] [address], [place], [place], [address] Phone: [phone]   Fax:  [phone]

## 2015-04-14 ENCOUNTER — Ambulatory Visit: Payer: Medicare Other | Admitting: Licensed Clinical Social Worker

## 2015-04-15 ENCOUNTER — Encounter: Payer: Self-pay | Admitting: Physical Therapy

## 2015-04-15 ENCOUNTER — Ambulatory Visit: Payer: Medicare Other | Admitting: Physical Therapy

## 2015-04-15 DIAGNOSIS — M25561 Pain in right knee: Secondary | ICD-10-CM

## 2015-04-15 DIAGNOSIS — M25661 Stiffness of right knee, not elsewhere classified: Secondary | ICD-10-CM | POA: Diagnosis not present

## 2015-04-15 DIAGNOSIS — M79604 Pain in right leg: Secondary | ICD-10-CM | POA: Diagnosis not present

## 2015-04-15 DIAGNOSIS — M7631 Iliotibial band syndrome, right leg: Secondary | ICD-10-CM | POA: Diagnosis not present

## 2015-04-15 NOTE — Therapy (Signed)
[facility] [address], [place], [place], [address] Phone: [phone]   Fax:  [phone]  Physical Therapy Treatment  Patient Details  Name: Tracy Malone MRN: [id number] Date of Birth: January 17, 1932 Referring Provider:  Gavin Pound, MD  Encounter Date: 04/15/2015      PT End of Session - 04/15/15 1139    Visit Number 16   Number of Visits 20   Date for PT Re-Evaluation 05/03/15   PT Start Time 1100   PT Stop Time 1142   PT Time Calculation (min) 42 min   Activity Tolerance Patient tolerated treatment well   Behavior During Therapy [facility] for tasks assessed/performed      Past Medical History  Diagnosis Date  . Depression     treated in the past for years;stopped in 2010 for a years  . Panic attack   . Claustrophobia   . Fibromyalgia   . IBS (irritable bowel syndrome)     Dr. Benson Norway  . Glaucoma, narrow-angle     s/p laser surgery  . Duodenal ulcer 1962    h/o  . Hyperthyroidism     h/o x 2 years  . Shingles 1999    h/o  . Hypothyroid 9/08  . GERD (gastroesophageal reflux disease)   . Trochanteric bursitis 12/2008    bilateral  . Superficial thrombophlebitis 03/2009    RLE  . Bell's palsy 1966    ? right side facial droop  . Osteoporosis 10/11    Dr.Hawkes  . SLE (systemic lupus erythematosus)     with arthralgia/myalgia, positive ANA centromere pattern, positive DNA, positive RNP, indeterminant anti-cardiolipin antibody-Dr.Hawkes.  . Carotid artery disease 2010    on vascular screening;unchanged 2013.(could not tolerate simvastatin, no other statins tried)--<30% blockage bilat 07/2011  . Recurrent UTI     has cystocele-Dr.Grewal  . Chronic fatigue and malaise   . Chronic abdominal pain   . Frequent PVCs 07/2012    Seen by [facility]: benign, asymptomatic, normal EF  . Ocular migraine     Past Surgical History  Procedure Laterality Date  . Tonsillectomy  1946  . Vaginal hysterectomy  1971     and bladder repair.  Still has ovaries  . Cataract extraction, bilateral  1995, 1996  . Thyroidectomy, partial  09/2005    L nodule; Dr. Harlow Asa  . Upper gi endoscopy  06/27/12  . Flexible sigmoidoscopy    . Abdominal hysterectomy      There were no vitals filed for this visit.  Visit Diagnosis:  Knee stiffness, right  Right anterior knee pain  Pain of right lower extremity  Iliotibial band syndrome, right      Subjective Assessment - 04/15/15 1105    Subjective Pt "achy" all over due to the weather, R knee pain unchanged.  New pain in Rt calf, pt feels it may be from theraband   Pain Score 4    Pain Location Knee   Pain Orientation Right                         [facility] Adult PT Treatment/Exercise - 04/15/15 0001    Knee/Hip Exercises: Stretches   Gastroc Stretch Right;2 reps;30 seconds   Knee/Hip Exercises: Aerobic   Nustep L3 x 10 miniutes   Knee/Hip Exercises: Standing   SLS 5 x 10 sec Bilat   Other Standing Knee Exercises SLS with knee llifts 10 sec x 2 bilat   Knee/Hip Exercises:  Supine   Short Arc Quad Sets Strengthening;Right;3 sets;10 reps   Knee Extension PROM;Right  with foot on towel roll gravity assisted stretch 2 x 60 seco   Knee/Hip Exercises: Sidelying   Clams 2 x 10   Knee/Hip Exercises: Prone   Hamstring Curl 2 sets;10 reps   Manual Therapy   Manual Therapy Soft tissue mobilization   Manual therapy comments sidelying  pt with difficulty toleraating due to pain   Soft tissue mobilization Rt ITB                PT Education - 04/15/15 1139    Education provided Yes   Education Details SLS at counter at home, try for 10 seconds   Person(s) Educated Patient   Methods Explanation;Demonstration   Comprehension Returned demonstration;Verbalized understanding          PT Short Term Goals - 04/15/15 1142    PT SHORT TERM GOAL #1   Title Independent with HEP    Status Achieved   PT SHORT TERM GOAL #2   Title Report 30%  decrease in Rt. knee pain with sitting and knitting activities    Status Achieved   PT SHORT TERM GOAL #3   Title Report 30% decrease in Rt knee pain with getting in and out of car    Status Achieved   PT SHORT TERM GOAL #4   Title Improve AROM knee ROM to -10 degrees on Rt. knee for improved gait mechanics    Status Achieved           PT Long Term Goals - 04/15/15 1142    PT LONG TERM GOAL #1   Title Independent with advanced HEP    Status On-going   PT LONG TERM GOAL #2   Title Improve Rt. hamstring strength to 4+/5 to improve walking ability    Status Achieved   PT LONG TERM GOAL #3   Title Increase walking time to 45 minutes without being limited by Rt. knee pain    Status On-going   PT LONG TERM GOAL #4   Title Improve AROM to > or = -5 degrees to improve gait mechanics    Status On-going   PT LONG TERM GOAL #5   Title Report 70% improvement in Rt. knee pain with sitting activities    Status Achieved   PT LONG TERM GOAL #6   Title Improve FOTO score limitation to < or = 49%    Status On-going               Plan - 04/15/15 1140    Clinical Impression Statement pt continues with pain as the main limiting factor, able to progress to work on balance exercises today   Pt will benefit from skilled therapeutic intervention in order to improve on the following deficits Abnormal gait;Pain;Increased muscle spasms;Decreased activity tolerance;Decreased endurance;Decreased strength;Difficulty walking;Impaired flexibility;Dizziness;Decreased safety awareness   Rehab Potential Good   PT Frequency 2x / week   PT Duration 3 weeks   PT Treatment/Interventions ADLs/Self Care Home Management;Cryotherapy;Electrical Stimulation;Gait training;Ultrasound;Moist Heat;Iontophoresis 4mg /ml Dexamethasone;Stair training;Therapeutic activities;Functional mobility training;Therapeutic exercise;Neuromuscular re-education;Manual techniques;Patient/family education;Orthotic Fit/Training;Passive  range of motion;Dry needling   PT Next Visit Plan continue balance, ROM, strength   PT Home Exercise Plan added SLS   Consulted and Agree with Plan of Care Patient        Problem List Patient Active Problem List   Diagnosis Date Noted  . Essential tremor 03/02/2015  . Subacromial bursitis 03/02/2015  . Resting tremor  02/01/2015  . Epigastric fullness 10/12/2014  . Early satiety 10/12/2014  . Cystocele 12/30/2013  . IBS (irritable bowel syndrome) 09/30/2013  . GERD (gastroesophageal reflux disease) 06/27/2013  . PVC's (premature ventricular contractions) 10/02/2012  . Depressive disorder, not elsewhere classified 10/02/2012  . H. pylori infection 08/01/2012  . Bradycardia 08/01/2012  . Fatigue 08/01/2012  . Anxiety state, unspecified 05/13/2012  . Osteopenia 05/13/2012  . Hypothyroidism 05/13/2012    Isabelle Course, PT, DPT  04/15/2015, 11:44 AM  [facility] [address] [place], [place], [address] Phone: [phone]   Fax:  [phone]

## 2015-04-19 ENCOUNTER — Encounter: Payer: Self-pay | Admitting: Physical Therapy

## 2015-04-19 ENCOUNTER — Other Ambulatory Visit: Payer: Self-pay | Admitting: Obstetrics and Gynecology

## 2015-04-19 DIAGNOSIS — Z6822 Body mass index (BMI) 22.0-22.9, adult: Secondary | ICD-10-CM | POA: Diagnosis not present

## 2015-04-19 DIAGNOSIS — N819 Female genital prolapse, unspecified: Secondary | ICD-10-CM | POA: Diagnosis not present

## 2015-04-19 DIAGNOSIS — Z124 Encounter for screening for malignant neoplasm of cervix: Secondary | ICD-10-CM | POA: Diagnosis not present

## 2015-04-20 LAB — CYTOLOGY - PAP

## 2015-04-21 ENCOUNTER — Ambulatory Visit: Payer: Medicare Other | Admitting: Physical Therapy

## 2015-04-22 DIAGNOSIS — F411 Generalized anxiety disorder: Secondary | ICD-10-CM | POA: Diagnosis not present

## 2015-04-23 ENCOUNTER — Ambulatory Visit: Payer: Medicare Other | Admitting: Physical Therapy

## 2015-04-23 ENCOUNTER — Encounter: Payer: Self-pay | Admitting: Physical Therapy

## 2015-04-23 DIAGNOSIS — M25561 Pain in right knee: Secondary | ICD-10-CM | POA: Diagnosis not present

## 2015-04-23 DIAGNOSIS — M25661 Stiffness of right knee, not elsewhere classified: Secondary | ICD-10-CM

## 2015-04-23 DIAGNOSIS — M7631 Iliotibial band syndrome, right leg: Secondary | ICD-10-CM | POA: Diagnosis not present

## 2015-04-23 DIAGNOSIS — M79604 Pain in right leg: Secondary | ICD-10-CM | POA: Diagnosis not present

## 2015-04-23 NOTE — Therapy (Signed)
[facility] [address], [place], [place], [address] Phone: [phone]   Fax:  [phone]  Physical Therapy Treatment  Patient Details  Name: [place] MRN: [id number] Date of Birth: May 30, 1932 Referring Provider:  Gavin Pound, MD  Encounter Date: 04/23/2015      PT End of Session - 04/23/15 1020    Visit Number 17   Number of Visits 20   Date for PT Re-Evaluation 05/03/15   PT Start Time 1015   PT Stop Time 1055   PT Time Calculation (min) 40 min   Activity Tolerance Patient tolerated treatment well   Behavior During Therapy [facility] for tasks assessed/performed      Past Medical History  Diagnosis Date  . Depression     treated in the past for years;stopped in 2010 for a years  . Panic attack   . Claustrophobia   . Fibromyalgia   . IBS (irritable bowel syndrome)     Dr. Benson Norway  . Glaucoma, narrow-angle     s/p laser surgery  . Duodenal ulcer 1962    h/o  . Hyperthyroidism     h/o x 2 years  . Shingles 1999    h/o  . Hypothyroid 9/08  . GERD (gastroesophageal reflux disease)   . Trochanteric bursitis 12/2008    bilateral  . Superficial thrombophlebitis 03/2009    RLE  . Bell's palsy 1966    ? right side facial droop  . Osteoporosis 10/11    Dr.Hawkes  . SLE (systemic lupus erythematosus)     with arthralgia/myalgia, positive ANA centromere pattern, positive DNA, positive RNP, indeterminant anti-cardiolipin antibody-Dr.Hawkes.  . Carotid artery disease 2010    on vascular screening;unchanged 2013.(could not tolerate simvastatin, no other statins tried)--<30% blockage bilat 07/2011  . Recurrent UTI     has cystocele-Dr.Grewal  . Chronic fatigue and malaise   . Chronic abdominal pain   . Frequent PVCs 07/2012    Seen by [facility]: benign, asymptomatic, normal EF  . Ocular migraine     Past Surgical History  Procedure Laterality Date  . Tonsillectomy  1946  . Vaginal hysterectomy  1971     and bladder repair.  Still has ovaries  . Cataract extraction, bilateral  1995, 1996  . Thyroidectomy, partial  09/2005    L nodule; Dr. Harlow Asa  . Upper gi endoscopy  06/27/12  . Flexible sigmoidoscopy    . Abdominal hysterectomy      There were no vitals filed for this visit.  Visit Diagnosis:  Knee stiffness, right  Right anterior knee pain  Pain of right lower extremity      Subjective Assessment - 04/23/15 1023    Subjective sitting in the car is the worse for me.     Limitations Sitting   How long can you sit comfortably? 60 min.   How long can you stand comfortably? No issues    How long can you walk comfortably? 90 min.   Diagnostic tests MRI - normal - see imaging    Patient Stated Goals reduce pain and be able to straighten out leg without pain, getting in/out of the car, sitting for 45 minutes, walking for 30 minutes at a time    Currently in Pain? No/denies            [facility] PT Assessment - 04/23/15 0001    Assessment   Medical Diagnosis Rt Knee pain    Onset Date/Surgical Date 08/17/14   Prior  Therapy Previously had pelvic floor PT    Precautions   Precautions Other (comment)   Restrictions   Weight Bearing Restrictions No   Balance Screen   Has the patient fallen in the past 6 months No   Has the patient had a decrease in activity level because of a fear of falling?  No   Is the patient reluctant to leave their home because of a fear of falling?  No   Home Ecologist residence   Home Access Stairs to enter   Home Layout Two level   Prior Function   Level of Independence Independent with basic ADLs   Vocation Retired   Clinical research associate, Building services engineer puzzles, crafts    Cognition   Overall Cognitive Status Within Functional Limits for tasks assessed   Observation/Other Assessments   Focus on Therapeutic Outcomes (FOTO)  43% limitaiton CK   ROM / Strength   AROM / PROM / Strength AROM   AROM   AROM Assessment Site Knee    Right/Left Knee Right   Right Knee Extension -3   Strength   Right/Left Hip Right   Right Hip Flexion 5/5   Left Hip Flexion 5/5   Right/Left Knee Right;Left   Right Knee Extension 4+/5   Left Knee Flexion 5/5   Left Knee Extension 5/5   Right Ankle Dorsiflexion 4+/5   Left Ankle Dorsiflexion 5/5                     [facility] Adult PT Treatment/Exercise - 04/23/15 0001    Knee/Hip Exercises: Stretches   Gastroc Stretch Right;2 reps;30 seconds   Knee/Hip Exercises: Aerobic   Nustep L3 x 10 miniutes   Knee/Hip Exercises: Supine   Short Arc Quad Sets Strengthening;Right;3 sets;10 reps   Short Arc Quad Sets Limitations 3   Heel Slides Strengthening;Right;15 reps;2 sets;10 reps   Straight Leg Raises Strengthening;Right;2 sets;10 reps                  PT Short Term Goals - 04/15/15 1142    PT SHORT TERM GOAL #1   Title Independent with HEP    Status Achieved   PT SHORT TERM GOAL #2   Title Report 30% decrease in Rt. knee pain with sitting and knitting activities    Status Achieved   PT SHORT TERM GOAL #3   Title Report 30% decrease in Rt knee pain with getting in and out of car    Status Achieved   PT SHORT TERM GOAL #4   Title Improve AROM knee ROM to -10 degrees on Rt. knee for improved gait mechanics    Status Achieved           PT Long Term Goals - 04/23/15 1021    PT LONG TERM GOAL #1   Title Independent with advanced HEP    Time 8   Period Weeks   Status Achieved   PT LONG TERM GOAL #2   Title Improve Rt. hamstring strength to 4+/5 to improve walking ability    Time 8   Period Weeks   Status Achieved   PT LONG TERM GOAL #3   Title Increase walking time to 45 minutes without being limited by Rt. knee pain    Time 8   Period Weeks   Status Achieved   PT LONG TERM GOAL #4   Title Improve AROM to > or = -5 degrees to improve gait mechanics  Time 8   Period Weeks   Status Achieved   PT LONG TERM GOAL #6   Title Improve FOTO score  limitation to < or = 49%    Time 8   Period Weeks   Status Achieved               Plan - 05-May-2015 1055    Clinical Impression Statement Patient has met all of her goals.  Patient right knee extension AROM -3 degrees.  Patient is independent with HEP.  Right hip is 5/5.  Right knee strength for extension 4+/5 and flexion 5/5.  Right ankle strength is 5/5.  Patient is ready to be discharged.    Pt will benefit from skilled therapeutic intervention in order to improve on the following deficits Abnormal gait;Pain;Increased muscle spasms;Decreased activity tolerance;Decreased endurance;Decreased strength;Difficulty walking;Impaired flexibility;Dizziness;Decreased safety awareness   Rehab Potential Good   Clinical Impairments Affecting Rehab Potential None   PT Treatment/Interventions ADLs/Self Care Home Management;Cryotherapy;Electrical Stimulation;Gait training;Ultrasound;Moist Heat;Iontophoresis 42m/ml Dexamethasone;Stair training;Therapeutic activities;Functional mobility training;Therapeutic exercise;Neuromuscular re-education;Manual techniques;Patient/family education;Orthotic Fit/Training;Passive range of motion;Dry needling   PT Next Visit Plan Discharge to HEP   PT Home Exercise Plan Current HEP   Consulted and Agree with Plan of Care Patient          G-Codes - 02016/10/[id number]    Functional Assessment Tool Used FOTO score is 43% limitation   Functional Limitation Mobility: Walking and moving around   Mobility: Walking and Moving Around Goal Status [phone] At least 40 percent but less than 60 percent impaired, limited or restricted   Mobility: Walking and Moving Around Discharge Status [phone] At least 40 percent but less than 60 percent impaired, limited or restricted      Problem List Patient Active Problem List   Diagnosis Date Noted  . Essential tremor 03/02/2015  . Subacromial bursitis 03/02/2015  . Resting tremor 02/01/2015  . Epigastric fullness 10/12/2014  . Early  satiety 10/12/2014  . Cystocele 12/30/2013  . IBS (irritable bowel syndrome) 09/30/2013  . GERD (gastroesophageal reflux disease) 06/27/2013  . PVC's (premature ventricular contractions) 10/02/2012  . Depressive disorder, not elsewhere classified 10/02/2012  . H. pylori infection 08/01/2012  . Bradycardia 08/01/2012  . Fatigue 08/01/2012  . Anxiety state, unspecified 05/13/2012  . Osteopenia 05/13/2012  . Hypothyroidism 05/13/2012    GRAY,CHERYL,PT 912-Oct-2016 11:00 AM  [facility] [address] [place] [id number]: [phone]  Fax:  [phone] PHYSICAL THERAPY DISCHARGE SUMMARY  Visits from Start of Care: 17  Current functional level related to goals / functional outcomes: See above.   Remaining deficits: See above.   Education / Equipment: HEP Plan: Patient agrees to discharge.  Patient goals were met. Patient is being discharged due to meeting the stated rehab goals. Thank you for the referral.Cheryl GPearline Cables PT 010/12/201611:00 AM    ?????     CEarlie Counts PT 010-06-1610:00 AM

## 2015-05-03 ENCOUNTER — Ambulatory Visit (INDEPENDENT_AMBULATORY_CARE_PROVIDER_SITE_OTHER): Payer: Medicare Other | Admitting: Licensed Clinical Social Worker

## 2015-05-03 ENCOUNTER — Ambulatory Visit (INDEPENDENT_AMBULATORY_CARE_PROVIDER_SITE_OTHER): Payer: Medicare Other | Admitting: Family Medicine

## 2015-05-03 ENCOUNTER — Encounter: Payer: Self-pay | Admitting: Family Medicine

## 2015-05-03 VITALS — BP 112/60 | HR 56 | Temp 97.7°F | Ht 59.0 in | Wt 112.6 lb

## 2015-05-03 DIAGNOSIS — L739 Follicular disorder, unspecified: Secondary | ICD-10-CM | POA: Diagnosis not present

## 2015-05-03 DIAGNOSIS — R350 Frequency of micturition: Secondary | ICD-10-CM

## 2015-05-03 DIAGNOSIS — F419 Anxiety disorder, unspecified: Secondary | ICD-10-CM | POA: Diagnosis not present

## 2015-05-03 DIAGNOSIS — N3000 Acute cystitis without hematuria: Secondary | ICD-10-CM

## 2015-05-03 LAB — POCT URINALYSIS DIPSTICK
Bilirubin, UA: NEGATIVE
GLUCOSE UA: NEGATIVE
KETONES UA: NEGATIVE
NITRITE UA: POSITIVE
PH UA: 5.5
Protein, UA: NEGATIVE
Spec Grav, UA: 1.02
Urobilinogen, UA: NEGATIVE

## 2015-05-03 MED ORDER — NITROFURANTOIN MONOHYD MACRO 100 MG PO CAPS: 100.0000 mg | ORAL_CAPSULE | Freq: Two times a day (BID) | ORAL | Status: DC

## 2015-05-03 NOTE — Patient Instructions (Signed)
Take the antibiotics twice daily.  We will be in touch with the culture results and let you know if any changes need to be made.  Rash in right armpit area:  Try cornstarch to keep it dry, can also use antibacterial ointment if getting tender or more red. Also can try warm compresses if getting larger/worse.

## 2015-05-03 NOTE — Progress Notes (Signed)
Chief Complaint  Patient presents with  . Urinary Frequency    and burning x 6 days. Has some chills the first few days.    Patient presents with complaint of possible UTI.  6-7 days ago she started to have urinary urgency, along with dysuria.  Now the burning is more constant.  The urgency has improved, but is having lower abdominal discomfort. She has some discomfort in her lower back, but not up into the flanks.  She had slight chills and sweats, but these resolved, none in 2 nights.  Denies fever.  She is feeling fatigued, "dragging".  Denies nausea, vomiting.  Last UTI was 2 months ago; culture showed Klebsiella, treated with nitrofurantoin. It showed intermediate sensitivity, and symptoms hadn't completely resolved after a week, so took a second course, and got completely better.  She saw Dr. Helane Rima recently, and is planning to have a pessary. Pap smear was normal (pt hadn't gotten results, but they are in computer and she was advised).  She is also complaining of a rash in her right axilla for about 3 weeks.  It hasn't gotten worse, but not improving.  She stopped using deoderant, hasn't been shaving.  Using various topical creams and lotions and antibacterial ointment. Denies any new products.  No rash on the left side.  PMH, PSH, SH reviewed.  Outpatient Encounter Prescriptions as of 05/03/2015  Medication Sig Note  . acetaminophen (TYLENOL) 650 MG CR tablet Take 650 mg by mouth as needed for pain.   Marland Kitchen ALPRAZolam (XANAX) 0.5 MG tablet Take 0.25 mg by mouth 2 (two) times daily as needed.  12/04/2014: Takes 1/2 tablet twice daily as needed  . b complex vitamins capsule Take 1 capsule by mouth daily.  03/03/2015: Three times a week  . Biotin 1000 MCG tablet Take 1,000 mcg by mouth daily. 03/03/2015: Three times a week  . Cholecalciferol (VITAMIN D) 2000 UNITS tablet Take 2,000 Units by mouth daily. 03/03/2015: Three times a week  . SYNTHROID 50 MCG tablet Take 0.5-1 tablets (25-50 mcg total)  by mouth daily before breakfast. Take half tab M, W, F, full tab T, Th ,Sat and Sun 02/01/2015: Takes 72mcg Sat, Sun, Tues, Thurs; 57mcg on MWF  . vitamin E 400 UNIT capsule Take 400 Units by mouth every Monday, Wednesday, and Friday. 3 times weekly   . ARTIFICIAL TEAR OP Apply 1-2 drops to eye 4 (four) times daily as needed (dry eyes).    . Loperamide HCl (IMODIUM PO) Take 1 tablet by mouth as needed.   . Magnesium 250 MG TABS Take 1 tablet by mouth daily.   . polyethylene glycol (MIRALAX / GLYCOLAX) packet Take 17 g by mouth as needed.  12/04/2014: As needed  . ranitidine (ZANTAC) 75 MG tablet Take 75 mg by mouth 2 (two) times daily. 02/01/2015: She plans to start this as needed (in place of prilosec)  . temazepam (RESTORIL) 15 MG capsule Take 15 mg by mouth at bedtime as needed for sleep.   . [DISCONTINUED] nitrofurantoin, macrocrystal-monohydrate, (MACROBID) 100 MG capsule Take 1 capsule (100 mg total) by mouth 2 (two) times daily.    No facility-administered encounter medications on file as of 05/03/2015.    Allergies  Allergen Reactions  . Salmon [Fish Allergy] Hives and Shortness Of Breath  . Shellfish Allergy Anaphylaxis  . Aspirin Other (See Comments)    Sever stomach pain due to ulcer scaring.  . Ciprofloxacin Diarrhea  . Codeine Nausea And Vomiting  . Darvocet [Propoxyphene N-Acetaminophen]  Nausea And Vomiting  . Demerol [Meperidine] Nausea And Vomiting    Violent vomitting  . Diphedryl [Diphenhydramine] Other (See Comments)    Increased pulse/small amount ok  . Doxycycline Hyclate Other (See Comments)    GI intolerance.  Marland Kitchen Epinephrine Other (See Comments)    Breathing problems  . Erythromycin Other (See Comments)    GI intolerance.  Yvette Rack [Cyclobenzaprine] Other (See Comments)    Tingly/prickly sensation.  . Iodine   . Keflex [Cephalexin] Hives  . Latex Other (See Comments)    Gloves ok  . Prednisone Other (See Comments)    Headache  . Pylera [Bis  Subcit-Metronid-Tetracyc] Swelling    Tongue swelling. Face tingling  . Sulfa Antibiotics Other (See Comments)    Increased pulse, fainting, diarrhea, thrush  . Xylocaine [Lidocaine Hcl]     With epinephrine, given by dentist.  Speeded up heart rate and she passed out (occured twice, at dentist)  . Zoloft [Sertraline Hcl] Swelling and Other (See Comments)    Migraine Swelling of tongue/lip (09/2012)  . Advil [Ibuprofen] Rash    Motrin ok with a GI effect.  . Clarithromycin Rash    Started after completing 10 day course of 2000 mg /day  . Penicillins Hives and Rash   ROS:  No URI symptoms, headaches, dizziness, cough, shortness of breath. No nausea, vomiting, diarrhea. No fever.  Some chills/sweats as per HPI.  No rash (other than axilla as per HPI), bleeding, bruising.  PHYSICAL EXAM: BP 112/60 mmHg  Pulse 56  Temp(Src) 97.7 F (36.5 C) (Tympanic)  Ht 4\' 11"  (1.499 m)  Wt 112 lb 9.6 oz (51.075 kg)  BMI 22.73 kg/m2  Well developed, pleasant, elderly female in no distress Neck: no lymphadenopathy or mass Heart: regular rate and rhythm Lungs: clear bilaterally Back: no CVA tenderness Abdomen: mild suprapubic tenderness, extending across entire lower abdomen. No epigastric tenderness, organomegaly or mass.  Normal bowel sounds. Extremities: Lipoma on right upper arm, nontender. No edema, normal pulses Skin: Small red bumps (not truly pustules) scattered in right axilla in follicular appearance. No erythema, warmth, swelling, tenderness Psych: normal mood, affect, hygiene or grooming Neuro: alert and oriented, cranial nerves intact. Normal strength, gait   Urine dip: 3+ leuks, nitrite +, 1+ blood, SG 1.020  ASSESSMENT/PLAN:  Acute cystitis without hematuria - Plan: Urine culture, nitrofurantoin, macrocrystal-monohydrate, (MACROBID) 100 MG capsule  Urinary frequency - Plan: POCT Urinalysis Dipstick  Folliculitis - mild, without acute infection (follicular dermatitis).  antibacterial ointment, keep dry/cornstarch powder; warm compresses if worsening/painful  Multiple allergies reviewed. Will start with macrobid since she tolerates this, and treat with this while waiting on the culture and sensitivities. If resistant, will need to change ABX.  Given allergies to Sulfa, PCN, cephalosporins, would go with one that caused just GI intolerance rather than a more serious reaction.  Discussed levaquin and cipro, and she prefers Cipro at low dose, if needed. Levaquin isn't listed in her allergies, but she recalls having a reaction, and that it was worse than the diarrhea from Cipro.  Rash R axilla--possibly mild folliculitis Try cornstarch to keep it dry, can also use antibacterial ointment if getting tender or more red. Also can try warm compresses if getting larger/worse.

## 2015-05-04 ENCOUNTER — Encounter: Payer: Self-pay | Admitting: Family Medicine

## 2015-05-04 ENCOUNTER — Ambulatory Visit (INDEPENDENT_AMBULATORY_CARE_PROVIDER_SITE_OTHER): Payer: Medicare Other | Admitting: Family Medicine

## 2015-05-04 VITALS — BP 138/80 | HR 60 | Ht 59.0 in | Wt 112.0 lb

## 2015-05-04 DIAGNOSIS — E7800 Pure hypercholesterolemia, unspecified: Secondary | ICD-10-CM | POA: Diagnosis not present

## 2015-05-04 DIAGNOSIS — K589 Irritable bowel syndrome without diarrhea: Secondary | ICD-10-CM

## 2015-05-04 DIAGNOSIS — I6529 Occlusion and stenosis of unspecified carotid artery: Secondary | ICD-10-CM

## 2015-05-04 DIAGNOSIS — I493 Ventricular premature depolarization: Secondary | ICD-10-CM | POA: Diagnosis not present

## 2015-05-04 DIAGNOSIS — R259 Unspecified abnormal involuntary movements: Secondary | ICD-10-CM

## 2015-05-04 DIAGNOSIS — E039 Hypothyroidism, unspecified: Secondary | ICD-10-CM

## 2015-05-04 DIAGNOSIS — N3000 Acute cystitis without hematuria: Secondary | ICD-10-CM | POA: Diagnosis not present

## 2015-05-04 DIAGNOSIS — R258 Other abnormal involuntary movements: Secondary | ICD-10-CM | POA: Diagnosis not present

## 2015-05-04 DIAGNOSIS — F411 Generalized anxiety disorder: Secondary | ICD-10-CM | POA: Diagnosis not present

## 2015-05-04 DIAGNOSIS — Z131 Encounter for screening for diabetes mellitus: Secondary | ICD-10-CM

## 2015-05-04 DIAGNOSIS — G252 Other specified forms of tremor: Secondary | ICD-10-CM

## 2015-05-04 DIAGNOSIS — Z0000 Encounter for general adult medical examination without abnormal findings: Secondary | ICD-10-CM

## 2015-05-04 NOTE — Progress Notes (Signed)
Chief Complaint  Patient presents with  . AWV    fasting AWV/med check without pap-sees Dr.Grewal. Saw Dr.Kerr recently and he changed her synthroid dosage to 3mcg. Would like to come back for flu shot when feeling better.    Tracy Malone is a 81 y.o. female who presents for annual wellness visit and follow-up on chronic medical conditions.  She sees Dr. Helane Rima for her GYN care. She has the following concerns:  Seen yesterday for UTI.  She has been taking the macrobid since last night (2 doses).  She slept well last night, and did not have any burning with urination this morning.  Depression/anxiety: She is under the care of Dr. Casimiro Needle. He wants her to take Trintellix, but it is too expensive. She doesn't have Medicare Part D.  She waiting to get a coupon from him and see what it will cost at [facility].  She continues to see Marya Amsler for counseling.She was told by [name] (neuro) to take xanax 1 in am, 1/2 in afternoon and 1 at night to help with tremor (and anxiety, which he feels exacerbates her tremor).  She has been taking 1/2 in the morning and at noon, and full tablet at bedtime.   GERD: No longer having any stomach problems since being treated for H.pylori this past year. Denies any heartburn, belching, abdominal pain, dysphagia.  IBS: Symptoms come and go depending on what she eats, worth with fatty food and a lot of roughage. She is careful with what she eats.   Hypothyroidism: Seeing Dr. Buddy Duty, who reduced her Synthroid dose to 37mcg.  She has f/u labs scheduled for next week.  She doesn't feel as tired, feels better overall.  Skin remains dry, no hair loss.  Mild carotid artery disease--intolerant of a statin in the past, and had been unwilling to try additional statins. Recheck of u/s in 2013 was stable, minimal disease. Denies neuro symptoms--no headaches, dizziness, numbness, tingling,memory problems, speech, or other concerns, just the tremor for which she saw the  neurologist.   Immunization History  Administered Date(s) Administered  . Influenza, High Dose Seasonal PF 06/13/2013, 04/28/2014  . Pneumococcal Conjugate-13 09/29/2013  . Tdap 09/23/2009   Declines shingles vaccine (has had shingles in past, afraid of shot) Last Pap smear: last month with Dr. Helane Rima Last mammogram: 02/2015 Last colonoscopy: 2006  Last DEXA: earlier this year; done through Dr. Trudie Reed (no record available to me, "about the same" per pt). Carotid u/s 07/2011 (no significant stenosis, stable/unchanged) Dentist: twice yearly Ophtho: yearly Exercise: exercise bike 15 minutes twice daily for 5x/week. No weight-bearing exercise.   Other doctors caring for patient include: GYN: Dr. Helane Rima Rheum: Dr. [name] (previously saw Dr. Trudie Reed, who moved; staying in same office) Ophtho: Dr. Satira Sark Dentist: Dr. Sandria Bales GI: Dr. Ardis Hughs (previously saw Dr. Benson Norway) Cardiology: Dr. Neta Mends: Dr. Derrel Nip Neuro: [name] Psychiatrist: Dr. Casimiro Needle Endocrinologist: Dr. Buddy Duty  Depression screen: +; full screen not done, as she is under the care of Dr. Casimiro Needle. No falls in the last year, and functional status screen was unremarkable.   End of Life Discussion:  Patient has a living will and medical power of attorney. She still hasn't gotten copies to [place].  Past Medical History  Diagnosis Date  . Depression     treated in the past for years;stopped in 2010 for a years  . Panic attack   . Claustrophobia   . Fibromyalgia   . IBS (irritable bowel syndrome)  Dr. Benson Norway  . Glaucoma, narrow-angle     s/p laser surgery  . Duodenal ulcer 1962    h/o  . Hyperthyroidism     h/o x 2 years  . Shingles 1999    h/o  . Hypothyroid 9/08  . GERD (gastroesophageal reflux disease)   . Trochanteric bursitis 12/2008    bilateral  . Superficial thrombophlebitis 03/2009    RLE  . Bell's palsy 1966    ? right side facial droop  . Osteoporosis 10/11    Dr.Hawkes  . SLE (systemic lupus  erythematosus) (HCC)     with arthralgia/myalgia, positive ANA centromere pattern, positive DNA, positive RNP, indeterminant anti-cardiolipin antibody-Dr.Hawkes.  . Carotid artery disease ([place]) 2010    on vascular screening;unchanged 2013.(could not tolerate simvastatin, no other statins tried)--<30% blockage bilat 07/2011  . Recurrent UTI     has cystocele-Dr.Grewal  . Chronic fatigue and malaise   . Chronic abdominal pain   . 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 Ocular migraine     Past Surgical History  Procedure Laterality Date  . Tonsillectomy  1946  . Vaginal hysterectomy  1971    and bladder repair.  Still has ovaries  . Cataract extraction, bilateral  1995, 1996  . Thyroidectomy, partial  09/2005    L nodule; Dr. Harlow Asa  . Upper gi endoscopy  06/27/12  . Flexible sigmoidoscopy    . Abdominal hysterectomy      Social History   Social History  . Marital Status: Married    Spouse Name: N/A  . Number of Children: 2  . Years of Education: N/A   Occupational History  . retired (school system)    Social History Main Topics  . Smoking status: Never Smoker   . Smokeless tobacco: Never Used  . Alcohol Use: No  . Drug Use: No  . Sexual Activity: No   Other Topics Concern  . Not on file   Social History Narrative   Married.  Son lives in [place]; Daughter [name] lives in [place]; 2 grandchildren    Family History  Problem Relation Age of Onset  . Heart disease Mother   . Hypertension Mother   . Hypertension Sister   . Diabetes Maternal Grandfather   . Heart disease Brother   . Cancer Brother     lung    Outpatient Encounter Prescriptions as of 05/04/2015  Medication Sig Note  . ALPRAZolam (XANAX) 0.5 MG tablet Take 0.25 mg by mouth 2 (two) times daily as needed.  05/04/2015: Taking 1/2 tablet in morning and noon, full tablet at bedtime  . ARTIFICIAL TEAR OP Apply 1-2 drops to eye 4 (four) times daily as needed (dry  eyes).    Marland Kitchen b complex vitamins capsule Take 1 capsule by mouth daily.  03/03/2015: Three times a week  . Biotin 1000 MCG tablet Take 1,000 mcg by mouth daily. 03/03/2015: Three times a week  . Cholecalciferol (VITAMIN D) 2000 UNITS tablet Take 2,000 Units by mouth daily. 05/04/2015: Taking daily  . levothyroxine (SYNTHROID) 25 MCG tablet Take 25 mcg by mouth daily before breakfast.   . nitrofurantoin, macrocrystal-monohydrate, (MACROBID) 100 MG capsule Take 1 capsule (100 mg total) by mouth 2 (two) times daily.   . vitamin E 400 UNIT capsule Take 400 Units by mouth every Monday, Wednesday, and Friday. 3 times weekly   . acetaminophen (TYLENOL) 650 MG CR tablet Take 650 mg by mouth as needed for  pain.   . Loperamide HCl (IMODIUM PO) Take 1 tablet by mouth as needed.   . temazepam (RESTORIL) 15 MG capsule Take 15 mg by mouth at bedtime as needed for sleep.   . [DISCONTINUED] Magnesium 250 MG TABS Take 1 tablet by mouth daily.   . [DISCONTINUED] polyethylene glycol (MIRALAX / GLYCOLAX) packet Take 17 g by mouth as needed.  12/04/2014: As needed  . [DISCONTINUED] ranitidine (ZANTAC) 75 MG tablet Take 75 mg by mouth 2 (two) times daily. 02/01/2015: She plans to start this as needed (in place of prilosec)  . [DISCONTINUED] SYNTHROID 50 MCG tablet Take 0.5-1 tablets (25-50 mcg total) by mouth daily before breakfast. Take half tab M, W, F, full tab T, Th ,Sat and Sun 02/01/2015: Takes 51mcg Sat, Sun, Tues, Thurs; 88mcg on MWF   No facility-administered encounter medications on file as of 05/04/2015.    Allergies  Allergen Reactions  . Salmon [Fish Allergy] Hives and Shortness Of Breath  . Shellfish Allergy Anaphylaxis  . Aspirin Other (See Comments)    Sever stomach pain due to ulcer scaring.  . Ciprofloxacin Diarrhea  . Codeine Nausea And Vomiting  . Darvocet [Propoxyphene N-Acetaminophen] Nausea And Vomiting  . Demerol [Meperidine] Nausea And Vomiting    Violent vomitting  . Diphedryl  [Diphenhydramine] Other (See Comments)    Increased pulse/small amount ok  . Doxycycline Hyclate Other (See Comments)    GI intolerance.  Marland Kitchen Epinephrine Other (See Comments)    Breathing problems  . Erythromycin Other (See Comments)    GI intolerance.  Yvette Rack [Cyclobenzaprine] Other (See Comments)    Tingly/prickly sensation.  . Iodine   . Keflex [Cephalexin] Hives  . Latex Other (See Comments)    Gloves ok  . Prednisone Other (See Comments)    Headache  . Pylera [Bis Subcit-Metronid-Tetracyc] Swelling    Tongue swelling. Face tingling  . Sulfa Antibiotics Other (See Comments)    Increased pulse, fainting, diarrhea, thrush  . Xylocaine [Lidocaine Hcl]     With epinephrine, given by dentist.  Speeded up heart rate and she passed out (occured twice, at dentist)  . Zoloft [Sertraline Hcl] Swelling and Other (See Comments)    Migraine Swelling of tongue/lip (09/2012)  . Advil [Ibuprofen] Rash    Motrin ok with a GI effect.  . Clarithromycin Rash    Started after completing 10 day course of 2000 mg /day  . Penicillins Hives and Rash   ROS: The patient denies anorexia (fair appetite), fever, weight changes, headaches, vision changes, decreased hearing, ear pain, sore throat, breast concerns, chest pain, dizziness, syncope, dyspnea on exertion, cough, swelling, nausea, vomiting, constipation, melena, hematochezia, hematuria, incontinence,vaginal bleeding, discharge, odor or itch, genital lesions, numbness, tingling, weakness, tremor, suspicious skin lesions, or enlarged lymph nodes. Some bruising (she relates to vitamin E), unchanged. No bleeding. Aware of PVC's only when lying down, otherwise no palpitations. No further problems with heartburn Intermittent diarrhea related to IBS (hasn't needed any imodium recently). Occasional hip pain, especially when lying down at night. Fibromyalgia--some shoulder pain/upper back, and slight in lower back. Some chronic mild tinnitus in the  right ear. +dysuria and UTI symptoms per yesterday's visit, improved today, but energy is still decreased. Very mild discomfort in lower abdomen and low back, less than yesterday. R knee pain (s/p PT x 14 weeks). Blurred vision when on a lot of meds, significantly improved now. Dry skin.  PHYSICAL EXAM:  BP 138/80 mmHg  Pulse 60  Ht 4\' 11"  (1.499  m)  Wt 112 lb (50.803 kg)  BMI 22.61 kg/m2  General Appearance:   Alert, cooperative, no distress, appears stated age  Head:   Normocephalic, without obvious abnormality, atraumatic  Eyes:   PERRL, conjunctiva/corneas clear, EOM's intact, fundi   benign  Ears:   Normal TM's and external ear canals  Nose:  Nares normal, mucosa normal, no drainage or sinus tenderness  Throat:  Lips, mucosa, and tongue normal; teeth and gums normal  Neck:  Supple, no lymphadenopathy; thyroid: no enlargement/tenderness/nodules; no carotid  bruit or JVD  Back:  Spine nontender, no curvature, ROM normal, no CVA tenderness  Lungs:   Clear to auscultation bilaterally without wheezes, rales or ronchi; respirations unlabored  Chest Wall:   No tenderness or deformity  Heart:   Regular rate and rhythm, S1 and S2 normal, no murmur, rub  or gallop; Bigeminy noted during visit (PVC every other beat)   Breast Exam:   Deferred to GYN  Abdomen:   Soft, non-tender, nondistended, normoactive bowel sounds,   no masses, no hepatosplenomegaly  Genitalia:   Deferred to GYN     Extremities:  No clubbing, cyanosis or edema. Soft, nontender subcutaneous mass on right upper arm, consistent with lipoma (unchanged)  Pulses:  2+ and symmetric all extremities  Skin:  Skin color, texture, turgor normal. Skin on arms mildly dry. No rashes/lesions.   Lymph nodes:  Cervical, supraclavicular, and axillary nodes normal  Neurologic:  CNII-XII intact, normal strength, sensation and gait; reflexes 2+ and symmetric  throughout. Resting tremor (somewhat pill-rolling in appearance) noted in the RUE   Psych: Normal mood, affect, hygiene and grooming        ASSESSMENT/PLAN:  Medicare annual wellness visit, subsequent  Pure hypercholesterolemia - low cholesterol diet reviewed; due for recheck.  Would not want to take statins, as previously indicated - Plan: Lipid panel  Screening for diabetes mellitus - Plan: Glucose, random  Carotid stenosis, unspecified laterality - minimal; no symptoms or bruit; repeat eval not indicated at this time - Plan: Lipid panel  Anxiety state - continue with counseling and with Dr. Casimiro Needle. Encouraged her to try Trintellix (she was asking about re-trying zoloft), and since no part D, can use coupons  Hypothyroidism, unspecified hypothyroidism type - euthyroid by history. Scheduled for repeat TSH since lowering dose with Dr. Buddy Duty soon  PVC's (premature ventricular contractions) - bigeminy today.  asymptomatic  IBS (irritable bowel syndrome) - controlled  Resting tremor - now under the care of Dr Felecia Shelling. stable  Acute cystitis without hematuria - somewhat improved after 2 doses of ABX. We will be in touch with culture results, continue macrobid  Discussed monthly self breast exams and yearly mammograms; at least 30 minutes of aerobic activity at least 5 days/week, weight-bearing exercise at least 2x/week; proper sunscreen use reviewed; healthy diet, including goals of calcium and vitamin D intake and alcohol recommendations (less than or equal to 1 drink/day) reviewed; regular seatbelt use; changing batteries in smoke detectors. Immunization recommendations discussed--needs to see if she has had pneumovax through [facility], if not then it is recommended. Zostavax reviewed and discussed (doesn't have Medicare Part D, so likely very costly). She declined flu shot today--wanted to fee better (feeling fatigued from UTI), and return in November. Colonoscopy  recommendations reviewed--colonoscopy is not recommended.  Recommended and discussed Cologard.  Consider shingles vaccine--since you don't have Part D Medicare, you will likely have to pay out of pocket.  Check the costs at the pharmacy (I believe it get it  through our office is about $350).  Please get immunization records from [place].  You may need a pneumovax at some point, depending on when your last one was (if over 10 years).  Medicare Attestation I have personally reviewed: The patient's medical and social history Their use of alcohol, tobacco or illicit drugs Their current medications and supplements The patient's functional ability including ADLs,fall risks, home safety risks, cognitive, and hearing and visual impairment Diet and physical activities Evidence for depression or mood disorders  The patient's weight, height, and BMI have been recorded in the chart.  I have made referrals, counseling, and provided education to the patient based on review of the above and I have provided the patient with a written personalized care plan for preventive services.     Jamayah Myszka A, MD   05/04/2015

## 2015-05-04 NOTE — Patient Instructions (Addendum)
  HEALTH MAINTENANCE RECOMMENDATIONS:  It is recommended that you get at least 30 minutes of aerobic exercise at least 5 days/week (for weight loss, you may need as much as 60-90 minutes). This can be any activity that gets your heart rate up. This can be divided in 10-15 minute intervals if needed, but try and build up your endurance at least once a week.  Weight bearing exercise is also recommended twice weekly.  Eat a healthy diet with lots of vegetables, fruits and fiber.  "Colorful" foods have a lot of vitamins (ie green vegetables, tomatoes, red peppers, etc).  Limit sweet tea, regular sodas and alcoholic beverages, all of which has a lot of calories and sugar.  Up to 1 alcoholic drink daily may be beneficial for women (unless trying to lose weight, watch sugars).  Drink a lot of water.  Calcium recommendations are 1200-1500 mg daily (1500 mg for postmenopausal women or women without ovaries), and vitamin D 1000 IU daily.  This should be obtained from diet and/or supplements (vitamins), and calcium should not be taken all at once, but in divided doses.  Monthly self breast exams and yearly mammograms for women over the age of 46 is recommended.  Sunscreen of at least SPF 30 should be used on all sun-exposed parts of the skin when outside between the hours of 10 am and 4 pm (not just when at beach or pool, but even with exercise, golf, tennis, and yard work!)  Use a sunscreen that says "broad spectrum" so it covers both UVA and UVB rays, and make sure to reapply every 1-2 hours.  Remember to change the batteries in your smoke detectors when changing your clock times in the spring and fall.  Use your seat belt every time you are in a car, and please drive safely and not be distracted with cell phones and texting while driving.   Tracy Malone , Thank you for taking time to come for your Medicare Wellness Visit. I appreciate your ongoing commitment to your health goals. Please review the following  plan we discussed and let me know if I can assist you in the future.   These are the goals we discussed: Goals    None      This is a list of the screening recommended for you and due dates:  Health Maintenance  Topic Date Due  . Shingles Vaccine  07/31/1993  . Pneumonia vaccines (2 of 2 - PPSV23) 09/30/2014  . Flu Shot  02/22/2015  . Tetanus Vaccine  09/24/2019  . DEXA scan (bone density measurement)  Completed   Please come back in a week or so to have your flu shot.  Consider shingles vaccine--since you don't have Part D Medicare, you will likely have to pay out of pocket.  Check the costs at the pharmacy (I believe it get it through our office is about $350).  Please get immunization records from [place].  You may need a pneumovax at some point, depending on when your last one was (if over 10 years).

## 2015-05-05 LAB — URINE CULTURE: Colony Count: >=100000

## 2015-05-05 LAB — LIPID PANEL
Cholesterol: 222 mg/dL — ABNORMAL HIGH (ref 125–200)
HDL: 77 mg/dL (ref >=46)
LDL CALC: 121 mg/dL (ref <130)
Total CHOL/HDL Ratio: 2.9 Ratio (ref <=5.0)
Triglycerides: 121 mg/dL (ref <150)
VLDL: 24 mg/dL (ref <30)

## 2015-05-05 LAB — GLUCOSE, RANDOM: GLUCOSE: 90 mg/dL (ref 65–99)

## 2015-05-10 ENCOUNTER — Encounter: Payer: Self-pay | Admitting: Family Medicine

## 2015-05-10 ENCOUNTER — Telehealth: Payer: Self-pay | Admitting: Family Medicine

## 2015-05-10 DIAGNOSIS — N3000 Acute cystitis without hematuria: Secondary | ICD-10-CM

## 2015-05-10 MED ORDER — NITROFURANTOIN MONOHYD MACRO 100 MG PO CAPS: 100.0000 mg | ORAL_CAPSULE | Freq: Two times a day (BID) | ORAL | Status: DC

## 2015-05-10 NOTE — Telephone Encounter (Signed)
Done (we had to do this with last UTI as well; only intermediate sensitivity).  I'll advise her per MyChart

## 2015-05-10 NOTE — Telephone Encounter (Signed)
Pt called and said she was getting some better, but got much worse last night.  Thought she was trying to pass a marble, felt swollen and burning.  Will you call in 2nd round of antibiotics?

## 2015-05-11 DIAGNOSIS — E039 Hypothyroidism, unspecified: Secondary | ICD-10-CM | POA: Diagnosis not present

## 2015-05-13 ENCOUNTER — Ambulatory Visit: Payer: Self-pay | Admitting: Neurology

## 2015-05-17 ENCOUNTER — Ambulatory Visit: Payer: Medicare Other | Admitting: Licensed Clinical Social Worker

## 2015-05-18 DIAGNOSIS — H52203 Unspecified astigmatism, bilateral: Secondary | ICD-10-CM | POA: Diagnosis not present

## 2015-05-18 DIAGNOSIS — H43813 Vitreous degeneration, bilateral: Secondary | ICD-10-CM | POA: Diagnosis not present

## 2015-05-18 DIAGNOSIS — H40013 Open angle with borderline findings, low risk, bilateral: Secondary | ICD-10-CM | POA: Diagnosis not present

## 2015-05-18 DIAGNOSIS — G43909 Migraine, unspecified, not intractable, without status migrainosus: Secondary | ICD-10-CM | POA: Diagnosis not present

## 2015-05-20 ENCOUNTER — Ambulatory Visit (INDEPENDENT_AMBULATORY_CARE_PROVIDER_SITE_OTHER): Payer: Medicare Other | Admitting: Family Medicine

## 2015-05-20 ENCOUNTER — Encounter: Payer: Self-pay | Admitting: Family Medicine

## 2015-05-20 VITALS — BP 120/62 | HR 60 | Ht 59.0 in | Wt 112.6 lb

## 2015-05-20 DIAGNOSIS — I6529 Occlusion and stenosis of unspecified carotid artery: Secondary | ICD-10-CM

## 2015-05-20 DIAGNOSIS — R3989 Other symptoms and signs involving the genitourinary system: Secondary | ICD-10-CM | POA: Diagnosis not present

## 2015-05-20 DIAGNOSIS — N390 Urinary tract infection, site not specified: Secondary | ICD-10-CM | POA: Diagnosis not present

## 2015-05-20 DIAGNOSIS — R21 Rash and other nonspecific skin eruption: Secondary | ICD-10-CM

## 2015-05-20 DIAGNOSIS — R300 Dysuria: Secondary | ICD-10-CM | POA: Diagnosis not present

## 2015-05-20 LAB — POCT URINALYSIS DIPSTICK
BILIRUBIN UA: NEGATIVE
GLUCOSE UA: NEGATIVE
KETONES UA: NEGATIVE
NITRITE UA: POSITIVE
Protein, UA: NEGATIVE
Spec Grav, UA: >=1.03
Urobilinogen, UA: NEGATIVE
pH, UA: 6

## 2015-05-20 NOTE — Patient Instructions (Signed)
Use either lamisil or lotrimin cream twice daily to the right armpit area. Use this for 2 weeks.  If rash doesn't improve at all, and/or if you develop itching, call for prescription steroid cream.  If rash persists/worsens, may need to do a biopsy vs refer you to dermatologist.  Try and continue to keep the skin dry. Okay to continue powder.  We are sending your urine for culture to determine if there truly is a persistent infection (given your ongoing lower abdominal discomfort).  If so, we will need to use a different antibiotic (since 2 weeks of the macrobid wouldn't have cured it).   Your urine was very concentrated today--be sure to drink at least 8 glasses (64 ounces) of beverages (non-alcoholic and non-caffeinated) each day--it doesn't have to all be water.  Limit your caffeine.  Per our last discussion, you would prefer cipro over levaquin. We would use a low dose, and you would want to take a probiotic along with it.  There was no evidence of any yeast infection on exam. If you start having thick white vaginal discharge and any internal itching, let us know, so we can recommend treatment. Your symptoms are related just to the area around your urethra, which appeared inflamed. Try using Vagisil (not the kind for yeast, but the kind for vaginal irritation) to this area as directed on the package.    If you have ongoing irritation and pain/itching in this area, we can send you to the urologist.  We can also consider a trial of topical estrogen just to this small area.

## 2015-05-20 NOTE — Progress Notes (Signed)
Chief Complaint  Patient presents with  . Burning with urination    since original visit 05/03/15. Also recheck on rash under right arm. Still there-same.   Urinary symptoms and abdominal pain felt better for 3 days, then burning recurred, felt like she was passing a pebble.  She called and was given a refill of the macrobid (also knowing that the sensitivity was only intermediate).  She again had temporary improvement from the second course of antibiotic.  She still has some residual pressure/discomfort in her lower abdomen, but much less.  Now is also complaining of itching externally. Denies internal itching or any known vaginal discharge.  Denies fevers, chills, nausea, vomiting.    She has persistent rash under her right arm. She has been using cornstarch powder.  It has never been itchy.  She previously tried A&D, neosporin and hydrocortisone without benefit.  PMH, PSH, SH reviewed.  Outpatient Encounter Prescriptions as of 05/20/2015  Medication Sig Note  . acetaminophen (TYLENOL) 650 MG CR tablet Take 650 mg by mouth as needed for pain.   Marland Kitchen ALPRAZolam (XANAX) 0.5 MG tablet Take 0.25 mg by mouth 2 (two) times daily as needed.  05/04/2015: Taking 1/2 tablet in morning and noon, full tablet at bedtime  . ARTIFICIAL TEAR OP Apply 1-2 drops to eye 4 (four) times daily as needed (dry eyes).    Marland Kitchen b complex vitamins capsule Take 1 capsule by mouth daily.  03/03/2015: Three times a week  . Biotin 1000 MCG tablet Take 1,000 mcg by mouth daily. 03/03/2015: Three times a week  . Cholecalciferol (VITAMIN D) 2000 UNITS tablet Take 2,000 Units by mouth daily. 05/04/2015: Taking daily  . levothyroxine (SYNTHROID) 25 MCG tablet Take 25 mcg by mouth daily before breakfast.   . temazepam (RESTORIL) 15 MG capsule Take 15 mg by mouth at bedtime as needed for sleep.   . vitamin E 400 UNIT capsule Take 400 Units by mouth every Monday, Wednesday, and Friday. 3 times weekly   . Loperamide HCl (IMODIUM PO) Take  1 tablet by mouth as needed.   . [DISCONTINUED] nitrofurantoin, macrocrystal-monohydrate, (MACROBID) 100 MG capsule Take 1 capsule (100 mg total) by mouth 2 (two) times daily.    No facility-administered encounter medications on file as of 05/20/2015.    Allergies  Allergen Reactions  . Salmon [Fish Allergy] Hives and Shortness Of Breath  . Shellfish Allergy Anaphylaxis  . Aspirin Other (See Comments)    Sever stomach pain due to ulcer scaring.  . Ciprofloxacin Diarrhea  . Codeine Nausea And Vomiting  . Darvocet [Propoxyphene N-Acetaminophen] Nausea And Vomiting  . Demerol [Meperidine] Nausea And Vomiting    Violent vomitting  . Diphedryl [Diphenhydramine] Other (See Comments)    Increased pulse/small amount ok  . Doxycycline Hyclate Other (See Comments)    GI intolerance.  Marland Kitchen Epinephrine Other (See Comments)    Breathing problems  . Erythromycin Other (See Comments)    GI intolerance.  Yvette Rack [Cyclobenzaprine] Other (See Comments)    Tingly/prickly sensation.  . Iodine   . Keflex [Cephalexin] Hives  . Latex Other (See Comments)    Gloves ok  . Prednisone Other (See Comments)    Headache  . Pylera [Bis Subcit-Metronid-Tetracyc] Swelling    Tongue swelling. Face tingling  . Sulfa Antibiotics Other (See Comments)    Increased pulse, fainting, diarrhea, thrush  . Xylocaine [Lidocaine Hcl]     With epinephrine, given by dentist.  Speeded up heart rate and she passed out (occured  twice, at dentist)  . Zoloft [Sertraline Hcl] Swelling and Other (See Comments)    Migraine Swelling of tongue/lip (09/2012)  . Advil [Ibuprofen] Rash    Motrin ok with a GI effect.  . Clarithromycin Rash    Started after completing 10 day course of 2000 mg /day  . Penicillins Hives and Rash   ROS: no fever, chills, nausea, vomiting, diarrhea, vaginal discharge. No bleeding, bruising. Rash R axilla per HPI, no other skin concerns.  +anxiety. No chest pain, palpitations, shortness of breath or URI  symptoms. See HPI.  PHYSICAL EXAM: BP 120/62 mmHg  Pulse 60  Ht 4\' 11"  (1.499 m)  Wt 112 lb 9.6 oz (51.075 kg)  BMI 22.73 kg/m2 Pleasant female in no distress Right axilla--some scattered patches of erythema. Nontender, no pustules or drainage. No axillary lymphadenopathy or mass External genitalia: Some inflammation noted surrounding the urethra, with some erythema. There is also a small (1-97mm) whitish polyp at the 5 o'clock position (at urethra). The remainder of the vaginal area looks normal. There is no discharge Abdomen: nontender (was tender diffusely along lower abdomen at her last visit). Back: no CVA tenderness. She describes a fullness on the left CVA area ,but nontender. Extremities:  No edema Psych: normal mood, affect, hygiene, grooming, eye contact and speech Neuro: alert and oriented. Normal cranial nerves, gait, strength  Urine dip: SG >1.030, trace blood, 1+ leuks, +nitrite  ASSESSMENT/PLAN:  Urinary tract infection without hematuria, site unspecified - improvement in symptoms; recheck to evaluate for cure - Plan: Urine culture  Burning with urination - Plan: POCT Urinalysis Dipstick, Urine culture  Rash - right axilla--trial of antifungal cream x 2 wks.  Urethral pain - trial of vagisil. consider topical premarin. small polyp present. consider urology referral if ongoing sx   Differential diagnosis of rash in axilla reviewed.  Given lack of itching and lack of response to hydrocortisone, with no worsening since using the Cornstarch, recommended trial of lamisil or lotrimin twice daily for 2 weeks (alternative that was also discussed was combo anti-fungal and steroid cream).  If no change with 2 weeks of antifungal, try a stronger steroid. If no improvement, consider small biopsy.   Use either lamisil or lotrimin cream twice daily to the right armpit area. Use this for 2 weeks.  If rash doesn't improve at all, and/or if you develop itching, call for prescription  steroid cream.  If rash persists/worsens, may need to do a biopsy vs refer you to dermatologist.  Try and continue to keep the skin dry. Okay to continue powder.  We are sending your urine for culture to determine if there truly is a persistent infection (given your ongoing lower abdominal discomfort).  If so, we will need to use a different antibiotic (since 2 weeks of the macrobid wouldn't have cured it).   Per our last discussion, you would prefer cipro over levaquin. We would use a low dose, and you would want to take a probiotic along with it.  There was no evidence of any yeast infection on exam. If you start having thick white vaginal discharge and any internal itching, let us know, so we can recommend treatment. Your symptoms are related just to the area around your urethra, which appeared inflamed. Try using Vagisil (not the kind for yeast, but the kind for vaginal irritation) to this area as directed on the package.    If you have ongoing irritation and pain/itching in this area, we can send you to the urologist.  We can also consider a trial of topical estrogen just to this small area.

## 2015-05-21 DIAGNOSIS — M24561 Contracture, right knee: Secondary | ICD-10-CM | POA: Diagnosis not present

## 2015-05-21 DIAGNOSIS — Z1212 Encounter for screening for malignant neoplasm of rectum: Secondary | ICD-10-CM | POA: Diagnosis not present

## 2015-05-21 DIAGNOSIS — M1711 Unilateral primary osteoarthritis, right knee: Secondary | ICD-10-CM | POA: Diagnosis not present

## 2015-05-21 DIAGNOSIS — M25561 Pain in right knee: Secondary | ICD-10-CM | POA: Diagnosis not present

## 2015-05-21 DIAGNOSIS — Z1211 Encounter for screening for malignant neoplasm of colon: Secondary | ICD-10-CM | POA: Diagnosis not present

## 2015-05-21 LAB — COLOGUARD: Cologuard: NEGATIVE

## 2015-05-22 LAB — URINE CULTURE

## 2015-05-24 ENCOUNTER — Telehealth: Payer: Self-pay | Admitting: Family Medicine

## 2015-05-24 ENCOUNTER — Encounter: Payer: Self-pay | Admitting: Family Medicine

## 2015-05-24 MED ORDER — CIPROFLOXACIN HCL 500 MG PO TABS: 500.0000 mg | ORAL_TABLET | Freq: Two times a day (BID) | ORAL | Status: DC

## 2015-05-24 NOTE — Telephone Encounter (Signed)
Please advise pt-- She was advised to use the full 500mg  dose, and only cut it in half if she couldn't tolerate it.  She can use a pill cutter, if needed (doesn't need to be exact, if she can't cut it exactly).

## 2015-05-24 NOTE — Telephone Encounter (Signed)
Called and informed pt.  

## 2015-05-24 NOTE — Telephone Encounter (Signed)
Pt says that she was instructed to cut Cipro 500mg  in half but she is concerned since pill does not have scoring on the pill. Will it be ok for pt to just judge the half mark?

## 2015-05-25 DIAGNOSIS — E039 Hypothyroidism, unspecified: Secondary | ICD-10-CM | POA: Diagnosis not present

## 2015-05-25 DIAGNOSIS — E063 Autoimmune thyroiditis: Secondary | ICD-10-CM | POA: Diagnosis not present

## 2015-05-25 DIAGNOSIS — Z79899 Other long term (current) drug therapy: Secondary | ICD-10-CM | POA: Diagnosis not present

## 2015-05-26 ENCOUNTER — Encounter: Payer: Self-pay | Admitting: *Deleted

## 2015-06-02 ENCOUNTER — Encounter: Payer: Self-pay | Admitting: Family Medicine

## 2015-06-02 DIAGNOSIS — L304 Erythema intertrigo: Secondary | ICD-10-CM | POA: Diagnosis not present

## 2015-06-02 NOTE — Telephone Encounter (Signed)
The way this was sent to me, it isn't allowing me to reply directly to her.  Advise that urine odor can be for many reasons (related to medications/vitamins/foods). If not otherwise having symptoms, don't worry about it.  Encourage her to continue to get regular exercise (ie the exercise bike) to work on her stamina and strength.

## 2015-06-07 ENCOUNTER — Encounter: Payer: Self-pay | Admitting: *Deleted

## 2015-06-10 ENCOUNTER — Encounter: Payer: Self-pay | Admitting: *Deleted

## 2015-06-11 DIAGNOSIS — F411 Generalized anxiety disorder: Secondary | ICD-10-CM | POA: Diagnosis not present

## 2015-06-12 DIAGNOSIS — Z23 Encounter for immunization: Secondary | ICD-10-CM | POA: Diagnosis not present

## 2015-06-30 DIAGNOSIS — H01005 Unspecified blepharitis left lower eyelid: Secondary | ICD-10-CM | POA: Diagnosis not present

## 2015-07-21 ENCOUNTER — Ambulatory Visit (INDEPENDENT_AMBULATORY_CARE_PROVIDER_SITE_OTHER): Payer: Medicare Other | Admitting: Licensed Clinical Social Worker

## 2015-07-21 DIAGNOSIS — F419 Anxiety disorder, unspecified: Secondary | ICD-10-CM | POA: Diagnosis not present

## 2015-08-04 ENCOUNTER — Ambulatory Visit (INDEPENDENT_AMBULATORY_CARE_PROVIDER_SITE_OTHER): Payer: Medicare Other | Admitting: Licensed Clinical Social Worker

## 2015-08-04 DIAGNOSIS — F419 Anxiety disorder, unspecified: Secondary | ICD-10-CM

## 2015-08-08 ENCOUNTER — Encounter: Payer: Self-pay | Admitting: Family Medicine

## 2015-08-08 NOTE — Telephone Encounter (Signed)
Please call and schedule her for OV for possible UTI. Thanks!

## 2015-08-09 ENCOUNTER — Ambulatory Visit (INDEPENDENT_AMBULATORY_CARE_PROVIDER_SITE_OTHER): Payer: Medicare Other | Admitting: Family Medicine

## 2015-08-09 ENCOUNTER — Encounter: Payer: Self-pay | Admitting: Family Medicine

## 2015-08-09 VITALS — BP 160/64 | HR 62 | Temp 97.6°F | Ht 59.0 in | Wt 113.8 lb

## 2015-08-09 DIAGNOSIS — N952 Postmenopausal atrophic vaginitis: Secondary | ICD-10-CM

## 2015-08-09 DIAGNOSIS — R300 Dysuria: Secondary | ICD-10-CM

## 2015-08-09 LAB — POCT URINALYSIS DIPSTICK
BILIRUBIN UA: NEGATIVE
Glucose, UA: NEGATIVE
KETONES UA: NEGATIVE
LEUKOCYTES UA: NEGATIVE
NITRITE UA: NEGATIVE
PH UA: 5.5
PROTEIN UA: NEGATIVE
RBC UA: NEGATIVE
Spec Grav, UA: >=1.03
Urobilinogen, UA: NEGATIVE

## 2015-08-09 NOTE — Progress Notes (Signed)
Chief Complaint  Patient presents with  . Vaginal Itching    and burning, also was burning when she urinated. Used Vagisil cream with some relief. UA today was negative. SG was 1.030. Very tired.    5 days ago she noticed pain and burning with voiding. She also started having itching in the vaginal area, and continued to have burning.  She has had some urgency, but not frequency.  Urine isn't cloudy, no blood or odor noted.  Denies fevers, chills.  She describes some bloating in her lower abdomen, some discomfort near her bladder, but not today. She woke up with some back pain, but denies any now.  She had her [facility] appointment rescheduled to the end of the month (they called her recently to r/s).  She was referred to [facility] for diagnosis of frequent UTI's, with multiple antibiotic allergies.   PMH, PSH, SH reviewed. Meds and allergies reviewed.  ROS: no fever, chills, URI symptoms, chest pain, shortness of breath, nausea, vomiting, bowel changes. Some intermittent bloating.  She had some mild heartburn from drinking cranberry juice.  No hematuria, bleeding, bruising, rash, headache, dizziness or other concerns toay.  PHYSICAL EXAM: BP 160/64 mmHg  Pulse 62  Temp(Src) 97.6 F (36.4 C) (Tympanic)  Ht 4\' 11"  (1.499 m)  Wt 113 lb 12.8 oz (51.619 kg)  BMI 22.97 kg/m2 Elderly, pleasant, mildly anxious female in no distress Heart: Bigeminy on exam, trace murmur. Lungs: clear bilaterally Neck: no lymphadenopathy or mass Neuro: alert and oriented. Normal gait. Mild resting tremor RUE Back: no CVA tendenress Abdomen: soft, nontender, no mass External genitalia: normal, atrophic changes noted.  Very mild thickening/inflammation of the left labia, but without erythema or other abnormality.  No vaginal discharge is noted Skin: normal turgor, no rashes Psych: full range of affect, slightly anxious. Normal hygiene, grooming, eye contact.  Urine dip: SG >= 1.030, otherwise  completely negative.  ASSESSMENT/PLAN:  Postmenopausal atrophic vaginitis  Burning with urination - Plan: POCT Urinalysis Dipstick  Reassured there was no evidence of recurrent infection. Likely atrophic vaginitis, and symptoms improved with Vagisil use--continue to use prn.  Answered many questions regarding cranberry juice/extract, other OTC urine agents.

## 2015-08-09 NOTE — Patient Instructions (Signed)
There is no evidence of infection.  The skin looks normal, just some normal atrophy (due to being postmenopausal). You may continue using the Vagisil (for irritation, not the kind of yeast) as needed.  Follow up with the urologist as scheduled (no rush at this point, since you do not have a current infection.

## 2015-08-13 DIAGNOSIS — F411 Generalized anxiety disorder: Secondary | ICD-10-CM | POA: Diagnosis not present

## 2015-08-13 DIAGNOSIS — H01005 Unspecified blepharitis left lower eyelid: Secondary | ICD-10-CM | POA: Diagnosis not present

## 2015-08-13 DIAGNOSIS — H01004 Unspecified blepharitis left upper eyelid: Secondary | ICD-10-CM | POA: Diagnosis not present

## 2015-08-23 DIAGNOSIS — L0293 Carbuncle, unspecified: Secondary | ICD-10-CM | POA: Diagnosis not present

## 2015-08-23 DIAGNOSIS — Z87440 Personal history of urinary (tract) infections: Secondary | ICD-10-CM | POA: Diagnosis not present

## 2015-08-23 DIAGNOSIS — N8111 Cystocele, midline: Secondary | ICD-10-CM | POA: Diagnosis not present

## 2015-08-23 DIAGNOSIS — M7551 Bursitis of right shoulder: Secondary | ICD-10-CM | POA: Diagnosis not present

## 2015-08-23 DIAGNOSIS — M1711 Unilateral primary osteoarthritis, right knee: Secondary | ICD-10-CM | POA: Diagnosis not present

## 2015-08-23 DIAGNOSIS — M25561 Pain in right knee: Secondary | ICD-10-CM | POA: Diagnosis not present

## 2015-08-23 DIAGNOSIS — Z0000 Encounter for general adult medical examination without abnormal findings: Secondary | ICD-10-CM | POA: Diagnosis not present

## 2015-08-23 DIAGNOSIS — M25511 Pain in right shoulder: Secondary | ICD-10-CM | POA: Diagnosis not present

## 2015-08-23 DIAGNOSIS — M7631 Iliotibial band syndrome, right leg: Secondary | ICD-10-CM | POA: Diagnosis not present

## 2015-08-25 ENCOUNTER — Ambulatory Visit (INDEPENDENT_AMBULATORY_CARE_PROVIDER_SITE_OTHER): Payer: Medicare Other | Admitting: Licensed Clinical Social Worker

## 2015-08-25 DIAGNOSIS — F419 Anxiety disorder, unspecified: Secondary | ICD-10-CM

## 2015-08-31 DIAGNOSIS — N393 Stress incontinence (female) (male): Secondary | ICD-10-CM | POA: Diagnosis not present

## 2015-08-31 DIAGNOSIS — N8110 Cystocele, unspecified: Secondary | ICD-10-CM | POA: Diagnosis not present

## 2015-09-01 DIAGNOSIS — L720 Epidermal cyst: Secondary | ICD-10-CM | POA: Diagnosis not present

## 2015-09-01 DIAGNOSIS — L820 Inflamed seborrheic keratosis: Secondary | ICD-10-CM | POA: Diagnosis not present

## 2015-09-01 DIAGNOSIS — L814 Other melanin hyperpigmentation: Secondary | ICD-10-CM | POA: Diagnosis not present

## 2015-09-01 DIAGNOSIS — L821 Other seborrheic keratosis: Secondary | ICD-10-CM | POA: Diagnosis not present

## 2015-09-01 DIAGNOSIS — I788 Other diseases of capillaries: Secondary | ICD-10-CM | POA: Diagnosis not present

## 2015-09-01 DIAGNOSIS — D225 Melanocytic nevi of trunk: Secondary | ICD-10-CM | POA: Diagnosis not present

## 2015-09-01 DIAGNOSIS — D1801 Hemangioma of skin and subcutaneous tissue: Secondary | ICD-10-CM | POA: Diagnosis not present

## 2015-09-02 ENCOUNTER — Ambulatory Visit (INDEPENDENT_AMBULATORY_CARE_PROVIDER_SITE_OTHER): Payer: Medicare Other | Admitting: Neurology

## 2015-09-02 ENCOUNTER — Encounter: Payer: Self-pay | Admitting: Neurology

## 2015-09-02 VITALS — BP 130/60 | HR 68 | Resp 14 | Ht 59.0 in | Wt 114.8 lb

## 2015-09-02 DIAGNOSIS — G250 Essential tremor: Secondary | ICD-10-CM | POA: Diagnosis not present

## 2015-09-02 DIAGNOSIS — M7551 Bursitis of right shoulder: Secondary | ICD-10-CM | POA: Diagnosis not present

## 2015-09-02 MED ORDER — CLONAZEPAM 0.5 MG PO TABS: ORAL_TABLET | ORAL | Status: DC

## 2015-09-02 NOTE — Progress Notes (Signed)
[facility]  PATIENT: Tracy Malone DOB: 1932-01-30  REFERRING DOCTOR OR PCP:  Joselyn Arrow SOURCE: patient and EMR records  _________________________________   HISTORICAL  CHIEF COMPLAINT:  Chief Complaint  Patient presents with  . Tremors    Sts. tremors are about the same.  Sts. right shoulder pain is worsening---inj. given at last ov helped.  She also received a right shoulder inj. from ortho 7-10 days ago, sts. has gotten about 30% relief of pain with this inj/fim  . Right Shoulder Pain    HISTORY OF PRESENT ILLNESS:  Tracy Malone is an 82 year old woman with tremor and right shoulder pain.   Tremor:   She first noted a right hand tremor in late 2015.   It was first noted, after a right arm injury.   She notes the tremor only in the right hand. She specifically notes it when she is holding a utensil like a spoon or fork. She has gotten heavier utensils and a thicker pen with benefit.   She takes Xanax for anxiety, 0.25 mg twice a day and  her tremor is better for a few hours after each pill. However, it makes her sleepy.   Her tremor will worsen if she is anxious or someone mentions the tremor.    She has not noted the tremor in the left arm or in the head.   She has not had any difficulty with her gait or balance. There have been no falls.   She has no family history tremors.  Shoulder pain:   She continues to have pain in the right shoulder and upper arm.   She has done physical therapy for that and feels it is a little bit better.  Pain is worse when she tries to raise her arm over her head or externally rotates.   I did a bursa injection and ortho did one.   Both shots helped about 30-40% for a few week   REVIEW OF SYSTEMS: Constitutional: No fevers, chills, sweats, or change in appetite Eyes: No visual changes, double vision, eye pain Ear, nose and throat: No hearing loss, ear pain, nasal congestion, sore throat Cardiovascular: No chest pain,  palpitations.  Has had palpitations Respiratory: No shortness of breath at rest or with exertion.   No wheezes GastrointestinaI: No nausea, vomiting, diarrhea, abdominal pain, fecal incontinence Genitourinary: No dysuria, urinary retention or frequency.  No nocturia. Musculoskeletal: No neck pain, back pain Integumentary: No rash, pruritus, skin lesions Neurological: as above Psychiatric: No depression at this time.  Some anxiety Endocrine: No palpitations, diaphoresis, change in appetite, change in weigh or increased thirst Hematologic/Lymphatic: No anemia, purpura, petechiae. Allergic/Immunologic: No itchy/runny eyes, nasal congestion, recent allergic reactions, rashes  ALLERGIES: Allergies  Allergen Reactions  . Salmon [Fish Allergy] Hives and Shortness Of Breath  . Shellfish Allergy Anaphylaxis  . Aspirin Other (See Comments)    Sever stomach pain due to ulcer scaring.  . Ciprofloxacin Diarrhea  . Codeine Nausea And Vomiting  . Darvocet [Propoxyphene N-Acetaminophen] Nausea And Vomiting  . Demerol [Meperidine] Nausea And Vomiting    Violent vomitting  . Diphedryl [Diphenhydramine] Other (See Comments)    Increased pulse/small amount ok  . Doxycycline Hyclate Other (See Comments)    GI intolerance.  Marland Kitchen Epinephrine Other (See Comments)    Breathing problems  . Erythromycin Other (See Comments)    GI intolerance.  [name] [Cyclobenzaprine] Other (See Comments)    Tingly/prickly sensation.  . Iodine   .  Keflex [Cephalexin] Hives  . Latex Other (See Comments)    Gloves ok  . Prednisone Other (See Comments)    Headache  . Pylera [Bis Subcit-Metronid-Tetracyc] Swelling    Tongue swelling. Face tingling  . Sulfa Antibiotics Other (See Comments)    Increased pulse, fainting, diarrhea, thrush  . Xylocaine [Lidocaine Hcl]     With epinephrine, given by dentist.  Speeded up heart rate and she passed out (occured twice, at dentist)  . Zoloft [Sertraline Hcl] Swelling and  Other (See Comments)    Migraine Swelling of tongue/lip (09/2012)  . Advil [Ibuprofen] Rash    Motrin ok with a GI effect.  . Clarithromycin Rash    Started after completing 10 day course of 2000 mg /day  . Penicillins Hives and Rash    HOME MEDICATIONS:  Current outpatient prescriptions:  .  acetaminophen (TYLENOL) 650 MG CR tablet, Take 650 mg by mouth as needed for pain., Disp: , Rfl:  .  ALPRAZolam (XANAX) 0.5 MG tablet, Take 0.25 mg by mouth 2 (two) times daily as needed. , Disp: , Rfl:  .  ARTIFICIAL TEAR OP, Apply 1-2 drops to eye 4 (four) times daily as needed (dry eyes). , Disp: , Rfl:  .  b complex vitamins capsule, Take 1 capsule by mouth daily. , Disp: , Rfl:  .  Cholecalciferol (VITAMIN D) 2000 UNITS tablet, Take 2,000 Units by mouth daily., Disp: , Rfl:  .  levothyroxine (SYNTHROID) 25 MCG tablet, Take 25 mcg by mouth daily before breakfast., Disp: , Rfl:  .  vitamin E 400 UNIT capsule, Take 400 Units by mouth every Monday, Wednesday, and Friday. 3 times weekly, Disp: , Rfl:  .  Biotin 1000 MCG tablet, Take 1,000 mcg by mouth daily. Reported on 09/02/2015, Disp: , Rfl:  .  Loperamide HCl (IMODIUM PO), Take 1 tablet by mouth as needed. Reported on 09/02/2015, Disp: , Rfl:   PAST MEDICAL HISTORY: Past Medical History  Diagnosis Date  . Depression     treated in the past for years;stopped in 2010 for a years  . Panic attack   . Claustrophobia   . Fibromyalgia   . IBS (irritable bowel syndrome)     Dr. Elnoria Howard  . Glaucoma, narrow-angle     s/p laser surgery  . Duodenal ulcer 1962    h/o  . Hyperthyroidism     h/o x 2 years  . Shingles 1999    h/o  . Hypothyroid 9/08  . GERD (gastroesophageal reflux disease)   . Trochanteric bursitis 12/2008    bilateral  . Superficial thrombophlebitis 03/2009    RLE  . Bell's palsy 1966    ? right side facial droop  . Osteoporosis 10/11    Dr.Hawkes  . SLE (systemic lupus erythematosus) (HCC)     with arthralgia/myalgia, positive  ANA centromere pattern, positive DNA, positive RNP, indeterminant anti-cardiolipin antibody-Dr.Hawkes.  . Carotid artery disease (HCC) 2010    on vascular screening;unchanged 2013.(could not tolerate simvastatin, no other statins tried)--<30% blockage bilat 07/2011  . Recurrent UTI     has cystocele-Dr.Grewal  . Chronic fatigue and malaise   . Chronic abdominal pain   . 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 Ocular migraine     PAST SURGICAL HISTORY: Past Surgical History  Procedure Laterality Date  . Tonsillectomy  1946  . Vaginal hysterectomy  1971    and bladder repair.  Still has ovaries  .  Cataract extraction, bilateral  1995, 1996  . Thyroidectomy, partial  09/2005    L nodule; Dr. Gerrit Friends  . Upper gi endoscopy  06/27/12  . Flexible sigmoidoscopy    . Abdominal hysterectomy      FAMILY HISTORY: Family History  Problem Relation Age of Onset  . Heart disease Mother   . Hypertension Mother   . Hypertension Sister   . Diabetes Maternal Grandfather   . Heart disease Brother   . Cancer Brother     lung    SOCIAL HISTORY:  Social History   Social History  . Marital Status: Married    Spouse Name: N/A  . Number of Children: 2  . Years of Education: N/A   Occupational History  . retired (school system)    Social History Main Topics  . Smoking status: Never Smoker   . Smokeless tobacco: Never Used  . Alcohol Use: No  . Drug Use: No  . Sexual Activity: No   Other Topics Concern  . Not on file   Social History Narrative   Married.  Son lives in [place]; Daughter [name] lives in [place]; 2 grandchildren     PHYSICAL EXAM  Filed Vitals:   09/02/15 1111  BP: 130/60  Pulse: 68  Resp: 14  Height: 4\' 11"  (1.499 m)  Weight: 114 lb 12.8 oz (52.073 kg)    Body mass index is 23.17 kg/(m^2).   General: The patient is well-developed and well-nourished and in no acute distress  Musculoskeletal:  She is tender over the  right subacromial bursa and the right glenohumeral joint of the shoulder.   ROM is better than last visit  Neurologic Exam  Mental status: The patient is alert and oriented x 3 at the time of the examination. The patient has apparent normal recent and remote memory, with an apparently normal attention span and concentration ability.   Speech is normal.  Cranial nerves: Extraocular movements are full.   Facial strength is normal.  Trapezius and sternocleidomastoid strength is normal. No dysarthria is noted.    Motor:  There is no bradykinesia. Fast 7 hz Tremor increases with sustained posture.   Muscle bulk is normal.   Tone is normal. Strength is  5 / 5 in all 4 extremities.   Sensory: Sensory testing is intact to touch and vibration sensation in all 4 extremities.  Coordination: Cerebellar testing reveals good finger-nose-finger bilaterally.  Gait and station: Station is normal.   Gait is normal for age. Tandem gait is normal. No significant retropulsion. Romberg is negative.   Reflexes: Deep tendon reflexes are symmetric and normal bilaterally.    DIAGNOSTIC DATA (LABS, IMAGING, TESTING) - I reviewed patient records, labs, notes, testing and imaging myself where available.  Lab Results  Component Value Date   WBC 9.6 02/01/2015   HGB 14.2 02/01/2015   HCT 42.4 02/01/2015   MCV 88.9 02/01/2015   PLT 292 02/01/2015      Component Value Date/Time   NA 142 02/01/2015 0001   K 4.3 02/01/2015 0001   CL 105 02/01/2015 0001   CO2 27 02/01/2015 0001   GLUCOSE 90 05/04/2015 0001   BUN 17 02/01/2015 0001   CREATININE 0.83 02/01/2015 0001   CREATININE 0.78 07/04/2013 1045   CALCIUM 9.3 02/01/2015 0001   PROT 6.8 02/01/2015 0001   ALBUMIN 4.0 02/01/2015 0001   AST 22 02/01/2015 0001   ALT 17 02/01/2015 0001   ALKPHOS 55 02/01/2015 0001   BILITOT 0.6 02/01/2015 0001  GFRNONAA 77* 07/04/2013 1045   GFRAA 90* 07/04/2013 1045   Lab Results  Component Value Date   CHOL 222*  05/04/2015   HDL 77 05/04/2015   LDLCALC 121 05/04/2015   TRIG 121 05/04/2015   CHOLHDL 2.9 05/04/2015   No results found for: HGBA1C No results found for: VITAMINB12 Lab Results  Component Value Date   TSH 1.817 02/01/2015       ASSESSMENT AND PLAN  Essential tremor  Subacromial bursitis, right   1.   Trial of low dose clonazepam (0.5 mg pills) take 1/2 pill 2 -  3 times a day. This is to take the place of Xanax not in addition   Most likely tremor is benign essential tremor (rather than Parkinson's) as no bradykinesia, no cogwheeling and stable gait 2.   Stay active and exercise as tolerated. 3.  She is seeing ortho for her shoulder. 4.   She will return to see me in 6 months or call sooner if there are new or worsening neurologic symptoms.   Terence Bart A. Epimenio Foot, MD, PhD 09/02/2015, 11:16 AM Certified in Neurology, Clinical Neurophysiology, Sleep Medicine, Pain Medicine and Neuroimaging  [facility] [address], [address] [place], [place] [address] [phone]

## 2015-09-02 NOTE — Patient Instructions (Signed)
Instead of Xanax, try, clonazepam one half pill up to 3 times a day to see if it works better for your tremor and anxiety.

## 2015-09-06 ENCOUNTER — Encounter: Payer: Self-pay | Admitting: Family Medicine

## 2015-09-06 ENCOUNTER — Ambulatory Visit (INDEPENDENT_AMBULATORY_CARE_PROVIDER_SITE_OTHER): Payer: Medicare Other | Admitting: Family Medicine

## 2015-09-06 ENCOUNTER — Telehealth: Payer: Self-pay | Admitting: *Deleted

## 2015-09-06 VITALS — BP 132/80 | HR 60 | Temp 98.5°F | Ht 59.0 in | Wt 111.2 lb

## 2015-09-06 DIAGNOSIS — R8290 Unspecified abnormal findings in urine: Secondary | ICD-10-CM | POA: Diagnosis not present

## 2015-09-06 DIAGNOSIS — R109 Unspecified abdominal pain: Secondary | ICD-10-CM

## 2015-09-06 DIAGNOSIS — R1012 Left upper quadrant pain: Secondary | ICD-10-CM

## 2015-09-06 LAB — POCT URINALYSIS DIPSTICK
BILIRUBIN UA: NEGATIVE
GLUCOSE UA: NEGATIVE
KETONES UA: NEGATIVE
Nitrite, UA: NEGATIVE
Protein, UA: NEGATIVE
RBC UA: NEGATIVE
Urobilinogen, UA: NEGATIVE
pH, UA: 5.5

## 2015-09-06 NOTE — Telephone Encounter (Signed)
Patient advised.

## 2015-09-06 NOTE — Telephone Encounter (Signed)
Patient called and is having a 2hr dental procedure, she forgot to ask you this afternoon if you think it would be okay to have nitrous oxide? Please advise, thanks.

## 2015-09-06 NOTE — Telephone Encounter (Signed)
That's fine

## 2015-09-06 NOTE — Patient Instructions (Signed)
  Eat bland diet--avoid fried/greasy foods, continue to avoid dairy (Lactaid is okay). See if any food triggers the discomfort--it sounds like it may be gas based on the location, and that positional change and gaviscon helped.  Things that can cause gas are beans, greans, broccoli.  If you want to eat these foods, try taking Beano when you eat these foods.  I recommend using simethicone (ie Gas-X) or gaviscon, as needed for the discomfort. This may be related to IBS (irritable bowel syndrome).  If ongoing problems with pain in the stomach, especially with ongoing bowel changes, you should follow up with your gastroenterologist (Dr. Ardis Hughs). If you develop fever, vomiting, worsening pain, you will need to be seen more urgently.  I think that the clonazepam is a reasonable thing to try--might help both with your anxiety (which can affect IBS and tremor)--run it by Dr. Casimiro Needle, but I think it would be a good thing to try --IN PLACE OF ALPRAZOLAM, not to be used together.  Knowing your sensitivity to medications, I'd start with the lowest possible dose (ie 1/4 tablet if you are able to cut it that small, and then increase to the 1/2 tablet if 1/4 isn't effective and doesn't cause side effects).  Your urine dip had 3+ white cells today.  Given lack of symptoms, I do not think that you have a urinary tract infection.  Should you develop symptoms, return to the urologist for repeat urine testing (and culture) and treatment.

## 2015-09-06 NOTE — Progress Notes (Signed)
Chief Complaint  Patient presents with  . Abdominal Pain    left sided under her ribs, not a sharp pain, just aches. Feels like it swells up after she eats.    "I just feel awful".  She feels like the LUQ quadrant swells and aches.  Going on for the last 6 weeks. Pain is constant, hasn't changed.  Feels like she is hungry, and should be relieved by food, but it doesn't go away. Feels different than ulcer she reports having in the past.  Felt better last night after laying on her right side and taking gaviscon.  Doesn't seem to be related to food/eating.    She had some nausea this morning, otherwise no nausea related to this discomfort.  Denies any heartburn, reflux (just very mild).  Bowels are normal--frequent, but not loose.  She has been getting up once or twice a night to have a BM.  She denies any changes in diet, just decreased appetite.  She saw the urologist recently (referred for frequent UTI's), who prescribed topical premarin to apply to the urethral area 3x/week.  Urine was reportedly clear at that visit. She currently denies any dysuria, but does have urinary frequency.  She drinks a lot of water. She denies any lower abdominal pain or feel like she has a UTI, like prior infections.  She has been using the topical premarin cream, just recently started (got from Dr. Helane Rima).  She had the pessary placed, and is having slight discomfort/twinges, like is possibly has shifted. She has appointment to see her tomorrow.  She saw neuro last week, who recommended low dose of clonazepam for benign essential tremor. She was told to Dr. Casimiro Needle and her PCP about this.  She hasn't picked up the prescription yet.   PMH, PSH, SH reviewed.  Outpatient Encounter Prescriptions as of 09/06/2015  Medication Sig Note  . ALPRAZolam (XANAX) 0.5 MG tablet Take 0.25 mg by mouth 2 (two) times daily as needed.  05/04/2015: Taking 1/2 tablet in morning and noon, full tablet at bedtime  . ARTIFICIAL TEAR OP  Apply 1-2 drops to eye 4 (four) times daily as needed (dry eyes).    Marland Kitchen b complex vitamins capsule Take 1 capsule by mouth daily.  03/03/2015: Three times a week  . Cholecalciferol (VITAMIN D) 2000 UNITS tablet Take 2,000 Units by mouth daily. 05/04/2015: Taking daily  . levothyroxine (SYNTHROID) 25 MCG tablet Take 25 mcg by mouth daily before breakfast.   . acetaminophen (TYLENOL) 650 MG CR tablet Take 650 mg by mouth as needed for pain. Reported on 09/06/2015   . clonazePAM (KLONOPIN) 0.5 MG tablet Take 1/2 pill up to 3 times a day (Patient not taking: Reported on 09/06/2015)   . Loperamide HCl (IMODIUM PO) Take 1 tablet by mouth as needed. Reported on 09/06/2015   . vitamin E 400 UNIT capsule Take 400 Units by mouth every Monday, Wednesday, and Friday. Reported on 09/06/2015   . [DISCONTINUED] Biotin 1000 MCG tablet Take 1,000 mcg by mouth daily. Reported on 09/02/2015 03/03/2015: Three times a week  . [DISCONTINUED] escitalopram (LEXAPRO) 10 MG tablet Take 10 mg by mouth every morning. 09/06/2015: Received from: External Pharmacy   No facility-administered encounter medications on file as of 09/06/2015.   Hasn't started the clonazepam yet.  Allergies  Allergen Reactions  . Salmon [Fish Allergy] Hives and Shortness Of Breath  . Shellfish Allergy Anaphylaxis  . Aspirin Other (See Comments)    Sever stomach pain due to ulcer scaring.  Marland Kitchen  Ciprofloxacin Diarrhea  . Codeine Nausea And Vomiting  . Darvocet [Propoxyphene N-Acetaminophen] Nausea And Vomiting  . Demerol [Meperidine] Nausea And Vomiting    Violent vomitting  . Diphedryl [Diphenhydramine] Other (See Comments)    Increased pulse/small amount ok  . Doxycycline Hyclate Other (See Comments)    GI intolerance.  Marland Kitchen Epinephrine Other (See Comments)    Breathing problems  . Erythromycin Other (See Comments)    GI intolerance.  Yvette Rack [Cyclobenzaprine] Other (See Comments)    Tingly/prickly sensation.  . Iodine   . Keflex [Cephalexin]  Hives  . Latex Other (See Comments)    Gloves ok  . Prednisone Other (See Comments)    Headache  . Pylera [Bis Subcit-Metronid-Tetracyc] Swelling    Tongue swelling. Face tingling  . Sulfa Antibiotics Other (See Comments)    Increased pulse, fainting, diarrhea, thrush  . Xylocaine [Lidocaine Hcl]     With epinephrine, given by dentist.  Speeded up heart rate and she passed out (occured twice, at dentist)  . Zoloft [Sertraline Hcl] Swelling and Other (See Comments)    Migraine Swelling of tongue/lip (09/2012)  . Advil [Ibuprofen] Rash    Motrin ok with a GI effect.  . Clarithromycin Rash    Started after completing 10 day course of 2000 mg /day  . Penicillins Hives and Rash   ROS: +fatigue, feels weak sometimes, some nausea today. More frequent bowel movements, per HPI.  +tremor, +LUQ pain, +right shoulder pain, right leg pain, and some pain in the lower back (h/o injection in the past, painful since the shot).  No numbness, tingling, focal weakness. +anxiety. Denies hopeless/helplessness  PHYSICAL EXAM: BP 132/80 mmHg  Pulse 60  Temp(Src) 98.5 F (36.9 C) (Tympanic)  Ht 4\' 11"  (1.499 m)  Wt 111 lb 3.2 oz (50.44 kg)  BMI 22.45 kg/m2 Elderly female in no distress.  Appears to be in very mild discomfort/frustrated/tired. Honestly, appears slightly depressed today. When asked about depression she stated "Dr. Casimiro Needle says I'm not depressed".  Neck: no lymphadenopathy or mass Back: No CVA or spinal tenderness Abdomen: normal bowel sounds.  No hepatomegaly. Mildly  tender in epigastrium, and minimal tenderness in RUQ.  No rebound tenderness or guarding.  nontender elsewhere, no suprapubic tenderness. Extremities: no edema Neuro: alert and oriented. Mild resting tremor noted in R hand. Normal gait, strength, cranial nerves   Urine dip: 3+ leuks--admits that perhaps she didn't do a great job with trying to get a clean catch urine ("could have wiped better")   ASSESSMENT/PLAN:  LUQ  abdominal pain - suspect IBS, gas. reviewed dietary recommendations and OTC meds prn. f/u with GI if worse  Abdominal pain, unspecified abdominal location - Plan: POCT Urinalysis Dipstick  Abnormal urinalysis - asymptomatic   Given lack of symptoms of UTI, will not send for culture. If symptoms develop, f/u with urologist.  LUQ pain--suspect IBS, related to gas.  Supportive measures reviewed.  F/u with GI if ongoing/worsening pain. Trial Beano, simethicone.  Tremor/anxiety--agree with trial of low dose clonazepam in place of xanax.  Risks/side effects reviewed--consider starting at 1/4 tab, if able to cut that way, due to her issues with sensitivities to meds--increase to 1/2 tablet if needed.  Double check with Dr. Martina Sinner that he agrees with this plans.  25-30 min visit, more than 1/2 spent counseling.   Eat bland diet--avoid fried/greasy foods, continue to avoid dairy (Lactaid is okay). See if any food triggers the discomfort--it sounds like it may be gas based on the  location, and that positional change and gaviscon helped.  Things that can cause gas are beans, greans, broccoli.  If you want to eat these foods, try taking Beano when you eat these foods.  I recommend using simethicone (ie Gas-X) or gaviscon, as needed for the discomfort. This may be related to IBS (irritable bowel syndrome).  If ongoing problems with pain in the stomach, especially with ongoing bowel changes, you should follow up with your gastroenterologist (Dr. Ardis Hughs). If you develop fever, vomiting, worsening pain, you will need to be seen more urgently.  I think that the clonazepam is a reasonable thing to try--might help both with your anxiety (which can affect IBS and tremor)--run it by Dr. Casimiro Needle, but I think it would be a good thing to try --IN PLACE OF ALPRAZOLAM, not to be used together.  Knowing your sensitivity to medications, I'd start with the lowest possible dose (ie 1/4 tablet if you are able to cut it  that small, and then increase to the 1/2 tablet if 1/4 isn't effective and doesn't cause side effects).  Your urine dip had 3+ white cells today.  Given lack of symptoms, I do not think that you have a urinary tract infection.  Should you develop symptoms, return to the urologist for repeat urine testing (and culture) and treatment.

## 2015-09-07 ENCOUNTER — Ambulatory Visit: Payer: Medicare Other | Admitting: Licensed Clinical Social Worker

## 2015-09-07 DIAGNOSIS — N390 Urinary tract infection, site not specified: Secondary | ICD-10-CM | POA: Diagnosis not present

## 2015-09-07 DIAGNOSIS — N819 Female genital prolapse, unspecified: Secondary | ICD-10-CM | POA: Diagnosis not present

## 2015-09-07 DIAGNOSIS — R3915 Urgency of urination: Secondary | ICD-10-CM | POA: Diagnosis not present

## 2015-09-08 ENCOUNTER — Telehealth: Payer: Self-pay | Admitting: Gastroenterology

## 2015-09-08 NOTE — Telephone Encounter (Signed)
Pt has been scheduled to see Cecille Rubin on 09/22/15 she will call back if she has any worsening symptoms

## 2015-09-08 NOTE — Telephone Encounter (Signed)
Pt has recurrent abd pain, the pain is the same as the past, no additional symptoms noted.  She feels "full".  Appt scheduled with Tracy Malone for 09/22/15

## 2015-09-13 ENCOUNTER — Encounter: Payer: Medicare Other | Admitting: Family Medicine

## 2015-09-14 DIAGNOSIS — F411 Generalized anxiety disorder: Secondary | ICD-10-CM | POA: Diagnosis not present

## 2015-09-14 DIAGNOSIS — N393 Stress incontinence (female) (male): Secondary | ICD-10-CM | POA: Diagnosis not present

## 2015-09-14 DIAGNOSIS — N8110 Cystocele, unspecified: Secondary | ICD-10-CM | POA: Diagnosis not present

## 2015-09-15 ENCOUNTER — Ambulatory Visit (INDEPENDENT_AMBULATORY_CARE_PROVIDER_SITE_OTHER): Payer: Medicare Other | Admitting: Licensed Clinical Social Worker

## 2015-09-15 DIAGNOSIS — F419 Anxiety disorder, unspecified: Secondary | ICD-10-CM | POA: Diagnosis not present

## 2015-09-22 ENCOUNTER — Telehealth: Payer: Self-pay | Admitting: Gastroenterology

## 2015-09-22 ENCOUNTER — Other Ambulatory Visit: Payer: Self-pay | Admitting: *Deleted

## 2015-09-22 ENCOUNTER — Ambulatory Visit (INDEPENDENT_AMBULATORY_CARE_PROVIDER_SITE_OTHER): Payer: Medicare Other | Admitting: Physician Assistant

## 2015-09-22 ENCOUNTER — Other Ambulatory Visit (INDEPENDENT_AMBULATORY_CARE_PROVIDER_SITE_OTHER): Payer: Medicare Other

## 2015-09-22 ENCOUNTER — Encounter: Payer: Self-pay | Admitting: Physician Assistant

## 2015-09-22 VITALS — BP 132/62 | HR 64 | Ht 58.75 in | Wt 111.5 lb

## 2015-09-22 DIAGNOSIS — K219 Gastro-esophageal reflux disease without esophagitis: Secondary | ICD-10-CM | POA: Diagnosis not present

## 2015-09-22 DIAGNOSIS — K2970 Gastritis, unspecified, without bleeding: Secondary | ICD-10-CM

## 2015-09-22 DIAGNOSIS — R945 Abnormal results of liver function studies: Secondary | ICD-10-CM

## 2015-09-22 DIAGNOSIS — R1013 Epigastric pain: Secondary | ICD-10-CM | POA: Diagnosis not present

## 2015-09-22 DIAGNOSIS — R7989 Other specified abnormal findings of blood chemistry: Secondary | ICD-10-CM

## 2015-09-22 LAB — CBC WITH DIFFERENTIAL/PLATELET
BASOS ABS: 0 10*3/uL (ref 0.0–0.1)
Basophils Relative: 0.3 % (ref 0.0–3.0)
Eosinophils Absolute: 0.1 10*3/uL (ref 0.0–0.7)
Eosinophils Relative: 1.1 % (ref 0.0–5.0)
HCT: 43.2 % (ref 36.0–46.0)
Hemoglobin: 14.5 g/dL (ref 12.0–15.0)
LYMPHS ABS: 2 10*3/uL (ref 0.7–4.0)
Lymphocytes Relative: 17.1 % (ref 12.0–46.0)
MCHC: 33.5 g/dL (ref 30.0–36.0)
MCV: 88.6 fl (ref 78.0–100.0)
MONO ABS: 1 10*3/uL (ref 0.1–1.0)
Monocytes Relative: 8.3 % (ref 3.0–12.0)
NEUTROS PCT: 73.2 % (ref 43.0–77.0)
Neutro Abs: 8.6 10*3/uL — ABNORMAL HIGH (ref 1.4–7.7)
Platelets: 255 10*3/uL (ref 150.0–400.0)
RBC: 4.87 Mil/uL (ref 3.87–5.11)
RDW: 13.9 % (ref 11.5–15.5)
WBC: 11.7 10*3/uL — ABNORMAL HIGH (ref 4.0–10.5)

## 2015-09-22 LAB — AMYLASE: AMYLASE: 40 U/L (ref 27–131)

## 2015-09-22 LAB — HEPATIC FUNCTION PANEL
ALT: 13 U/L (ref 0–35)
AST: 18 U/L (ref 0–37)
Albumin: 4.2 g/dL (ref 3.5–5.2)
Alkaline Phosphatase: 60 U/L (ref 39–117)
BILIRUBIN DIRECT: 0.2 mg/dL (ref 0.0–0.3)
BILIRUBIN TOTAL: 1.3 mg/dL — AB (ref 0.2–1.2)
Total Protein: 7.4 g/dL (ref 6.0–8.3)

## 2015-09-22 LAB — LIPASE: Lipase: 47 U/L (ref 11.0–59.0)

## 2015-09-22 LAB — GAMMA GT: GGT: 9 U/L (ref 7–51)

## 2015-09-22 MED ORDER — OMEPRAZOLE 20 MG PO CPDR: 20.0000 mg | DELAYED_RELEASE_CAPSULE | ORAL | Status: DC

## 2015-09-22 MED ORDER — HYOSCYAMINE SULFATE 0.125 MG SL SUBL: 0.1250 mg | SUBLINGUAL_TABLET | SUBLINGUAL | Status: DC | PRN

## 2015-09-22 NOTE — Patient Instructions (Signed)
You have been scheduled for an abdominal ultrasound at [facility] (1st floor of hospital) on 09/27/15 at 9 am. Please arrive 15 minutes prior to your appointment for registration. Make certain not to have anything to eat or drink 6 hours prior to your appointment. Should you need to reschedule your appointment, please contact radiology at [phone]. This test typically takes about 30 minutes to perform.  Your physician has requested that you go to the basement for lab work before leaving today.  We have sent the following medications to your pharmacy for you to pick up at your convenience: Omeprazole 20 mg every morning Levsin SL 0.125 mg dissolve as needed for cramps/bloating.  Please call back to Follow up with Dr. Ardis Hughs.

## 2015-09-22 NOTE — Telephone Encounter (Signed)
The appt schedule is not out that far now, she will call back to set up the appt in a couple weeks.

## 2015-09-22 NOTE — Progress Notes (Signed)
Patient ID: Tracy Malone, female   DOB: 08-30-1931, 82 y.o.   MRN: [id number]    Review of previous GI testing; Dr. Elnoria Howard:  --CBC, CMP, and TSH were normal in 07/2014 and again in 08/2014. CT scan02/26/2016. This was done without contrast due to dye allergy. This showed no acute findings in the abdomen and pelvis specifically to explain her history of abdominal pain. There is a 3.0 cm left ovarian cystic lesion that is likely benign. As I stated above, she is following with gynecology and they're going to monitor that, but they think that it is benign. Stomach, duodenum, small bowel, and colon grossly unremarkable.Abdominal ultrasound on 09/04/2014 showed no acute or chronic abnormalities of the hepatobiliary tree; evaluation of the pancreatic tail was limited. There is no acute abnormalities of the kidney demonstrated. A 1.6 cm diameter mid to lower pole cyst was noted in the right kidney.August 2015 HIDA scan with [facility], which was normal. This was performed for complaints of upper abdominal pain, appetite loss, fatigue, and weight loss at that time as well.EGD was in December 2013 at which time she was found to have moderate gastritis. Biopsies actually showed H. Pylori with chronic active gastritis, however, after taking 1 dose of the Pylera medication she discontinued it because of side effects (it made her feel horrible). Therefore, she has not completed treatment for H. Pylori. Duodenal biopsies were unremarkable at that time. flexible sigmoidoscopy also in December 2013 that was normal with only medium hemorrhoids noted. This was performed for diarrhea and biopsies of the descending and sigmoid colon were unremarkable. She states that she had a full colonoscopy prior to that with Dr. Elnoria Howard, however, after calling his office they stated they have never performed a full colonoscopy on her. Gastric emptying scan in January 2014 showed 33% of the ingested activity remaining in the stomach which is mildly  abnormal compared to the normal range of less than 30% of the ingested activity remaining at 120 minutes. Repeat GES 09/2014 was normal  History of Present Illness: Tracy Malone is a pleasant 82 year old female who was last seen by Dr. Christella Hartigan in May 2016. She is here today with ongoing complaints of intermittent left upper quadrant pain. Her pain is not affected by food but she states she experiences left upper quadrant bloating and distention after she eats. Her appetite has been poor. Her weight fluctuates. She had an EGD by Dr. Christella Hartigan in May 2016 that showed mild to moderate nonspecific pangastritis. She was H. pylori positive, was evaluated by infectious disease and treated and subsequent testing revealed that she had cleared the infection. Once she completed her treatment for H. pylori, she states she stopped taking her PPI because she felt fine. She has a burning in the epigastric area. Her bowel movements are moving daily but she has days of formed stools alternating with days of loose stools. She will sometimes eat, developed bloating and cramping and have an urgent bowel movement. She has had no bright red blood per rectum or melena.   Past Medical History  Diagnosis Date  . Depression     treated in the past for years;stopped in 2010 for a years  . Panic attack   . Claustrophobia   . Fibromyalgia   . IBS (irritable bowel syndrome)     Dr. Elnoria Howard  . Glaucoma, narrow-angle     s/p laser surgery  . Duodenal ulcer 1962    h/o  . Hyperthyroidism     h/o x  2 years  . Shingles 1999    h/o  . Hypothyroid 9/08  . GERD (gastroesophageal reflux disease)   . Trochanteric bursitis 12/2008    bilateral  . Superficial thrombophlebitis 03/2009    RLE  . Bell's palsy 1966    ? right side facial droop  . Osteoporosis 10/11    Dr.Hawkes  . SLE (systemic lupus erythematosus) (HCC)     with arthralgia/myalgia, positive ANA centromere pattern, positive DNA, positive RNP, indeterminant  anti-cardiolipin antibody-Dr.Hawkes.  . Carotid artery disease (HCC) 2010    on vascular screening;unchanged 2013.(could not tolerate simvastatin, no other statins tried)--<30% blockage bilat 07/2011  . Recurrent UTI     has cystocele-Dr.Grewal  . Chronic fatigue and malaise   . Chronic abdominal pain   . 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 Ocular migraine     Past Surgical History  Procedure Laterality Date  . Tonsillectomy  1946  . Vaginal hysterectomy  1971    and bladder repair.  Still has ovaries  . Cataract extraction, bilateral  1995, 1996  . Thyroidectomy, partial  09/2005    L nodule; Dr. Gerrit Friends  . Upper gi endoscopy  06/27/12  . Flexible sigmoidoscopy    . Abdominal hysterectomy     Family History  Problem Relation Age of Onset  . Heart disease Mother   . Hypertension Mother   . Hypertension Sister   . Diabetes Maternal Grandfather   . Heart disease Brother   . Lung cancer Brother     lung   Social History  Substance Use Topics  . Smoking status: Never Smoker   . Smokeless tobacco: Never Used  . Alcohol Use: No   Current Outpatient Prescriptions  Medication Sig Dispense Refill  . acetaminophen (TYLENOL) 650 MG CR tablet Take 650 mg by mouth as needed for pain. Reported on 09/06/2015    . ALPRAZolam (XANAX) 0.5 MG tablet Take 0.25 mg by mouth 2 (two) times daily as needed.     . ARTIFICIAL TEAR OP Apply 1-2 drops to eye 4 (four) times daily as needed (dry eyes).     Marland Kitchen b complex vitamins capsule Take 1 capsule by mouth daily.     . Cholecalciferol (VITAMIN D) 2000 UNITS tablet Take 2,000 Units by mouth daily.    . hyoscyamine (LEVSIN SL) 0.125 MG SL tablet Place 1 tablet (0.125 mg total) under the tongue every 4 (four) hours as needed for cramping. 60 tablet 3  . levothyroxine (SYNTHROID) 25 MCG tablet Take 25 mcg by mouth daily before breakfast.    . Loperamide HCl (IMODIUM PO) Take 1 tablet by mouth as needed.  Reported on 09/06/2015    . omeprazole (PRILOSEC) 20 MG capsule Take 1 capsule (20 mg total) by mouth every morning. 30 capsule 3  . vitamin E 400 UNIT capsule Take 400 Units by mouth every Monday, Wednesday, and Friday. Reported on 09/06/2015     No current facility-administered medications for this visit.   Allergies  Allergen Reactions  . Salmon [Fish Allergy] Hives and Shortness Of Breath  . Shellfish Allergy Anaphylaxis  . Aspirin Other (See Comments)    Sever stomach pain due to ulcer scaring.  . Ciprofloxacin Diarrhea  . Codeine Nausea And Vomiting  . Darvocet [Propoxyphene N-Acetaminophen] Nausea And Vomiting  . Demerol [Meperidine] Nausea And Vomiting    Violent vomitting  . Diphedryl [Diphenhydramine] Other (See Comments)    Increased pulse/small amount ok  .  Doxycycline Hyclate Other (See Comments)    GI intolerance.  Marland Kitchen Epinephrine Other (See Comments)    Breathing problems  . Erythromycin Other (See Comments)    GI intolerance.  [name] [Cyclobenzaprine] Other (See Comments)    Tingly/prickly sensation.  . Iodine   . Keflex [Cephalexin] Hives  . Latex Other (See Comments)    Gloves ok  . Prednisone Other (See Comments)    Headache  . Pylera [Bis Subcit-Metronid-Tetracyc] Swelling    Tongue swelling. Face tingling  . Sulfa Antibiotics Other (See Comments)    Increased pulse, fainting, diarrhea, thrush  . Xylocaine [Lidocaine Hcl]     With epinephrine, given by dentist.  Speeded up heart rate and she passed out (occured twice, at dentist)  . Zoloft [Sertraline Hcl] Swelling and Other (See Comments)    Migraine Swelling of tongue/lip (09/2012)  . Advil [Ibuprofen] Rash    Motrin ok with a GI effect.  . Clarithromycin Rash    Started after completing 10 day course of 2000 mg /day  . Penicillins Hives and Rash     Review of Systems: Per history of present illness otherwise negative.  LAB RESULTS:  Recent Labs  09/22/15 0951  WBC 11.7*  HGB 14.5  HCT  43.2  PLT 255.0    LFT  Recent Labs  09/22/15 0951  PROT 7.4  ALBUMIN 4.2  AST 18  ALT 13  ALKPHOS 60  BILITOT 1.3*  BILIDIR 0.2     Physical Exam: BP 132/62 mmHg  Pulse 64  Ht 4' 10.75" (1.492 m)  Wt 111 lb 8 oz (50.576 kg)  BMI 22.72 kg/m2 General: Pleasant, well developed , female in no acute distress Head: Normocephalic and atraumatic Lungs: Clear throughout to auscultation Heart: Regular rate and rhythm Abdomen: Soft, non distended, non-tender. No masses, no hepatomegaly. Normal bowel sounds.   Assessment and Recommendations:  82 year old female with ongoing complaints of dyspepsia and left upper quadrant discomfort likely due to recurrent gastritis. She does have a history of duodenal ulcers and has been off her PPI. An antireflux regimen has been reviewed. We will check a CBC, amylase, lipase, GGT, and hepatic function panel. Omeprazole 20 mg 1 by mouth every morning 30 minutes prior to breakfast. She is very concerned that she may be developing gallbladder problems as there is a strong family history. She did have an ultrasound a year ago that was nonrevealing but we will repeat the ultrasound per patient request. With regards to her postprandial cramping and urge to defecate, she may have a component of IBS. She will be given a trial of Levsin sublingual 0.125 mg dissolve 1 under tongue every 8 hours when necessary cramps. She will follow up in one to 2 months, sooner if needed.       Avonda Toso, Tollie Pizza PA-C 09/22/2015,

## 2015-09-23 NOTE — Progress Notes (Signed)
i agree with the above note, plan 

## 2015-09-27 ENCOUNTER — Ambulatory Visit (HOSPITAL_COMMUNITY)
Admission: RE | Admit: 2015-09-27 | Discharge: 2015-09-27 | Disposition: A | Discharge status: Home / Self Care | Payer: Medicare Other | Source: Ambulatory Visit | Attending: Physician Assistant | Admitting: Physician Assistant

## 2015-09-27 DIAGNOSIS — R1013 Epigastric pain: Secondary | ICD-10-CM | POA: Diagnosis not present

## 2015-09-27 DIAGNOSIS — K219 Gastro-esophageal reflux disease without esophagitis: Secondary | ICD-10-CM | POA: Diagnosis not present

## 2015-09-27 DIAGNOSIS — K2970 Gastritis, unspecified, without bleeding: Secondary | ICD-10-CM | POA: Diagnosis not present

## 2015-09-28 ENCOUNTER — Other Ambulatory Visit: Payer: Self-pay | Admitting: *Deleted

## 2015-09-29 ENCOUNTER — Other Ambulatory Visit (INDEPENDENT_AMBULATORY_CARE_PROVIDER_SITE_OTHER): Payer: Medicare Other

## 2015-09-29 DIAGNOSIS — N390 Urinary tract infection, site not specified: Secondary | ICD-10-CM | POA: Diagnosis not present

## 2015-09-29 DIAGNOSIS — R945 Abnormal results of liver function studies: Secondary | ICD-10-CM

## 2015-09-29 DIAGNOSIS — R7989 Other specified abnormal findings of blood chemistry: Secondary | ICD-10-CM | POA: Diagnosis not present

## 2015-09-29 DIAGNOSIS — N8110 Cystocele, unspecified: Secondary | ICD-10-CM | POA: Diagnosis not present

## 2015-09-29 DIAGNOSIS — R5383 Other fatigue: Secondary | ICD-10-CM | POA: Diagnosis not present

## 2015-09-29 DIAGNOSIS — N819 Female genital prolapse, unspecified: Secondary | ICD-10-CM | POA: Diagnosis not present

## 2015-09-29 LAB — CBC WITH DIFFERENTIAL/PLATELET
BASOS PCT: 0.2 % (ref 0.0–3.0)
Basophils Absolute: 0 10*3/uL (ref 0.0–0.1)
EOS PCT: 1.3 % (ref 0.0–5.0)
Eosinophils Absolute: 0.1 10*3/uL (ref 0.0–0.7)
HCT: 42.5 % (ref 36.0–46.0)
HEMOGLOBIN: 14.3 g/dL (ref 12.0–15.0)
LYMPHS ABS: 2 10*3/uL (ref 0.7–4.0)
Lymphocytes Relative: 16.9 % (ref 12.0–46.0)
MCHC: 33.5 g/dL (ref 30.0–36.0)
MCV: 89.3 fl (ref 78.0–100.0)
MONO ABS: 1 10*3/uL (ref 0.1–1.0)
Monocytes Relative: 8.2 % (ref 3.0–12.0)
Neutro Abs: 8.6 10*3/uL — ABNORMAL HIGH (ref 1.4–7.7)
Neutrophils Relative %: 73.4 % (ref 43.0–77.0)
Platelets: 268 10*3/uL (ref 150.0–400.0)
RBC: 4.76 Mil/uL (ref 3.87–5.11)
RDW: 13.8 % (ref 11.5–15.5)
WBC: 11.8 10*3/uL — AB (ref 4.0–10.5)

## 2015-09-29 LAB — HEPATIC FUNCTION PANEL
ALK PHOS: 68 U/L (ref 39–117)
ALT: 13 U/L (ref 0–35)
AST: 17 U/L (ref 0–37)
Albumin: 4 g/dL (ref 3.5–5.2)
BILIRUBIN DIRECT: 0.2 mg/dL (ref 0.0–0.3)
BILIRUBIN TOTAL: 0.9 mg/dL (ref 0.2–1.2)
Total Protein: 7 g/dL (ref 6.0–8.3)

## 2015-10-01 ENCOUNTER — Ambulatory Visit: Payer: Medicare Other | Admitting: Licensed Clinical Social Worker

## 2015-10-06 ENCOUNTER — Encounter: Payer: Self-pay | Admitting: *Deleted

## 2015-10-06 ENCOUNTER — Telehealth: Payer: Self-pay | Admitting: *Deleted

## 2015-10-06 NOTE — Telephone Encounter (Signed)
-----   Message from Vita Barley Hvozdovic, PA-C sent at 09/27/2015 10:07 PM EST ----- Rollene Fare, For this pt, she will need appt. in3-4 weeks. When app schedule comes out next week, can you set her up? Thanks, Cecille Rubin

## 2015-10-06 NOTE — Telephone Encounter (Signed)
Scheduled ov on 10/13/15 at 10:00 AM with Alonza Bogus, PA. Mailed a letter to patient with OV.

## 2015-10-12 ENCOUNTER — Ambulatory Visit (INDEPENDENT_AMBULATORY_CARE_PROVIDER_SITE_OTHER): Payer: Medicare Other | Admitting: Licensed Clinical Social Worker

## 2015-10-12 DIAGNOSIS — F419 Anxiety disorder, unspecified: Secondary | ICD-10-CM

## 2015-10-13 ENCOUNTER — Encounter: Payer: Self-pay | Admitting: Gastroenterology

## 2015-10-13 ENCOUNTER — Ambulatory Visit (INDEPENDENT_AMBULATORY_CARE_PROVIDER_SITE_OTHER): Payer: Medicare Other | Admitting: Gastroenterology

## 2015-10-13 VITALS — BP 118/60 | HR 72 | Ht 58.75 in | Wt 113.2 lb

## 2015-10-13 DIAGNOSIS — K589 Irritable bowel syndrome without diarrhea: Secondary | ICD-10-CM

## 2015-10-13 DIAGNOSIS — K30 Functional dyspepsia: Secondary | ICD-10-CM | POA: Diagnosis not present

## 2015-10-13 MED ORDER — AMBULATORY NON FORMULARY MEDICATION: Status: DC

## 2015-10-13 NOTE — Patient Instructions (Signed)
  Continue to use your Imodium as needed.    Today we are giving you samples and a coupon for  IBgard, take one capsule twice daily.  You may purchase this over the counter.     I appreciate the opportunity to care for you.

## 2015-10-13 NOTE — Progress Notes (Signed)
10/13/2015 Tracy Malone [id number] 12-Oct-1931  Review of previous GI testing; Dr. Benson Norway:  -CBC, CMP, and TSH were normal in 07/2014 and again in 08/2014. CT scan02/26/2016. This was done without contrast due to dye allergy.  This showed no acute findings in the abdomen and pelvis specifically to explain her history of abdominal pain. There is a 3.0 cm left ovarian cystic lesion that is likely benign. As I stated above, she is following with gynecology and they're going to monitor that, but they think that it is benign.  Stomach, duodenum, small bowel, and colon grossly unremarkable.Abdominal ultrasound on 09/04/2014 showed no acute or chronic abnormalities of the hepatobiliary tree; evaluation of the pancreatic tail was limited. There is no acute abnormalities of the kidney demonstrated. A 1.6 cm diameter mid to lower pole cyst was noted in the right kidney.  August 2015 HIDA scan with [facility], which was normal.  This was performed for complaints of upper abdominal pain, appetite loss, fatigue, and weight loss at that time as well.EGD was in December 2013 at which time she was found to have moderate gastritis. Biopsies actually showed H. Pylori with chronic active gastritis, however, after taking 1 dose of the Pylera medication she discontinued it because of side effects (it made her feel horrible).  Therefore, she has not completed treatment for H. Pylori. Duodenal biopsies were unremarkable at that time.  flexible sigmoidoscopy also in December 2013 that was normal with only medium hemorrhoids noted. This was performed for diarrhea and biopsies of the descending and sigmoid colon were unremarkable. She states that she had a full colonoscopy prior to that with Dr. Benson Norway, however, after calling his office they stated they have never performed a full colonoscopy on her. Gastric emptying scan in January 2014 showed 33% of the ingested activity remaining in the stomach which is mildly abnormal compared to the  normal range of less than 30% of the ingested activity remaining at 120 minutes.  Repeat GES 09/2014 was normal.  EGD 11/2014 with Dr. Ardis Hughs showed pan-gastritis with positive Hpylori on biopsies; also small hiatal hernia.  Saw ID and was apparently treated with subsequent testing negative.  Repeat abdominal ultrasound 09/2015 was normal.   History of Present Illness:  This is an 82 year old female who is known to Dr. Ardis Hughs and was just seen here  3 weeks ago by one of our PAs. Please see the note from 09/22/2015 by Tracy Bruce, Tracy Malone for full details regarding recent events.  Ultrasound was ordered and was normal.  She was given Levsin but says that it messed with her vision so she stopped taking it.  Returns today with ongoing complaints of upper abdominal pain and bloating. She says that she feels like her stomach swells up and gets very bloated even after eating very small amounts. She says that she still has intermittent issues with diarrhea and on Saturday she had 7 episodes, but when she took an Imodium it improved. She says that taking an occasional half pill of Imodium does help with the diarrhea and she is asking if she can just use that as needed. She tells me that she has had IBS symptoms since her daughter was born 2 years ago and was treated with phenobarbital in the past, which apparently worked very well for her.  Says that she also has lactose intolerance but eats all dairy-free/lactose free stuff.   Current Medications, Allergies, Past Medical History, Past Surgical History, Family History and Social History  were reviewed in [facility] record.   Physical Exam: BP 118/60 mmHg  Pulse 72  Ht 4' 10.75" (1.492 m)  Wt 113 lb 4 oz (51.37 kg)  BMI 23.08 kg/m2 General: Well developed white female in no acute distress Head: Normocephalic and atraumatic Eyes:  Sclerae anicteric, conjunctiva pink  Ears: Normal auditory acuity Lungs: Clear throughout to  auscultation Heart: Regular rate and rhythm Abdomen: Soft, non-distended.  Normal bowel sounds.  Expresses mild diffuse TTP but abdominal exam is benign. Musculoskeletal: Symmetrical with no gross deformities  Extremities: No edema  Neurological: Alert oriented x 4, grossly nonfocal Psychological:  Alert and cooperative. Normal mood and affect  Assessment and Recommendations: *82 year old female with ongoing complaints of dyspepsia and left upper quadrant discomfort, likely functional dyspepsia.  With regards to her bowel issues, very likely has IBS.  She is very intolerant or allergic to several medications.  She would like to use only Imodium prn for now, which I told her is completely acceptable.  She will also try IBgard BID prn for bloating (samples and coupon provided).  She will follow-up prn.

## 2015-10-14 NOTE — Progress Notes (Signed)
i agree with the above note, plan 

## 2015-10-18 DIAGNOSIS — N8189 Other female genital prolapse: Secondary | ICD-10-CM | POA: Diagnosis not present

## 2015-10-25 DIAGNOSIS — N8189 Other female genital prolapse: Secondary | ICD-10-CM | POA: Diagnosis not present

## 2015-10-26 DIAGNOSIS — M1711 Unilateral primary osteoarthritis, right knee: Secondary | ICD-10-CM | POA: Diagnosis not present

## 2015-10-27 ENCOUNTER — Ambulatory Visit (INDEPENDENT_AMBULATORY_CARE_PROVIDER_SITE_OTHER): Payer: Medicare Other | Admitting: Licensed Clinical Social Worker

## 2015-10-27 DIAGNOSIS — F419 Anxiety disorder, unspecified: Secondary | ICD-10-CM | POA: Diagnosis not present

## 2015-10-27 DIAGNOSIS — F411 Generalized anxiety disorder: Secondary | ICD-10-CM | POA: Diagnosis not present

## 2015-10-28 DIAGNOSIS — M1711 Unilateral primary osteoarthritis, right knee: Secondary | ICD-10-CM | POA: Diagnosis not present

## 2015-11-02 DIAGNOSIS — N83202 Unspecified ovarian cyst, left side: Secondary | ICD-10-CM | POA: Diagnosis not present

## 2015-11-03 DIAGNOSIS — M1711 Unilateral primary osteoarthritis, right knee: Secondary | ICD-10-CM | POA: Diagnosis not present

## 2015-11-09 ENCOUNTER — Ambulatory Visit (INDEPENDENT_AMBULATORY_CARE_PROVIDER_SITE_OTHER): Payer: Medicare Other | Admitting: Licensed Clinical Social Worker

## 2015-11-09 DIAGNOSIS — F331 Major depressive disorder, recurrent, moderate: Secondary | ICD-10-CM | POA: Diagnosis not present

## 2015-11-11 DIAGNOSIS — F411 Generalized anxiety disorder: Secondary | ICD-10-CM | POA: Diagnosis not present

## 2015-11-11 DIAGNOSIS — M1711 Unilateral primary osteoarthritis, right knee: Secondary | ICD-10-CM | POA: Diagnosis not present

## 2015-11-16 DIAGNOSIS — M1711 Unilateral primary osteoarthritis, right knee: Secondary | ICD-10-CM | POA: Diagnosis not present

## 2015-11-18 DIAGNOSIS — M1711 Unilateral primary osteoarthritis, right knee: Secondary | ICD-10-CM | POA: Diagnosis not present

## 2015-11-19 DIAGNOSIS — M810 Age-related osteoporosis without current pathological fracture: Secondary | ICD-10-CM | POA: Diagnosis not present

## 2015-11-19 DIAGNOSIS — R769 Abnormal immunological finding in serum, unspecified: Secondary | ICD-10-CM | POA: Diagnosis not present

## 2015-11-23 DIAGNOSIS — M1711 Unilateral primary osteoarthritis, right knee: Secondary | ICD-10-CM | POA: Diagnosis not present

## 2015-11-30 ENCOUNTER — Ambulatory Visit (INDEPENDENT_AMBULATORY_CARE_PROVIDER_SITE_OTHER): Payer: Medicare Other | Admitting: Licensed Clinical Social Worker

## 2015-11-30 DIAGNOSIS — F3341 Major depressive disorder, recurrent, in partial remission: Secondary | ICD-10-CM

## 2015-12-13 ENCOUNTER — Ambulatory Visit (INDEPENDENT_AMBULATORY_CARE_PROVIDER_SITE_OTHER): Payer: Medicare Other | Admitting: Licensed Clinical Social Worker

## 2015-12-13 DIAGNOSIS — F331 Major depressive disorder, recurrent, moderate: Secondary | ICD-10-CM

## 2015-12-17 ENCOUNTER — Telehealth: Payer: Self-pay

## 2015-12-17 ENCOUNTER — Encounter (HOSPITAL_COMMUNITY): Payer: Self-pay

## 2015-12-17 ENCOUNTER — Ambulatory Visit (HOSPITAL_COMMUNITY)
Admission: EM | Admit: 2015-12-17 | Discharge: 2015-12-17 | Disposition: A | Discharge status: Home / Self Care | Payer: Medicare Other | Attending: Emergency Medicine | Admitting: Emergency Medicine

## 2015-12-17 DIAGNOSIS — R5383 Other fatigue: Secondary | ICD-10-CM

## 2015-12-17 DIAGNOSIS — R531 Weakness: Secondary | ICD-10-CM

## 2015-12-17 LAB — POCT I-STAT, CHEM 8
BUN: 23 mg/dL — AB (ref 6–20)
CALCIUM ION: 1.16 mmol/L (ref 1.13–1.30)
CREATININE: 0.7 mg/dL (ref 0.44–1.00)
Chloride: 105 mmol/L (ref 101–111)
GLUCOSE: 103 mg/dL — AB (ref 65–99)
HCT: 45 % (ref 36.0–46.0)
Hemoglobin: 15.3 g/dL — ABNORMAL HIGH (ref 12.0–15.0)
Potassium: 4.8 mmol/L (ref 3.5–5.1)
Sodium: 141 mmol/L (ref 135–145)
TCO2: 30 mmol/L (ref 0–100)

## 2015-12-17 LAB — OCCULT BLOOD, POC DEVICE: FECAL OCCULT BLD: NEGATIVE

## 2015-12-17 NOTE — Discharge Instructions (Signed)
Fatigue We discussed options for adjusting medications. May take the Prozac 10 mg every other day for the next few days. If necessary may reduce alprazolam to one half dose. Do not stop taking either of these medications completely. Discussed this with your physicians. Fatigue is feeling tired all of the time, a lack of energy, or a lack of motivation. Occasional or mild fatigue is often a normal response to activity or life in general. However, long-lasting (chronic) or extreme fatigue may indicate an underlying medical condition. HOME CARE INSTRUCTIONS  Watch your fatigue for any changes. The following actions may help to lessen any discomfort you are feeling:  Talk to your health care provider about how much sleep you need each night. Try to get the required amount every night.  Take medicines only as directed by your health care provider.  Eat a healthy and nutritious diet. Ask your health care provider if you need help changing your diet.  Drink enough fluid to keep your urine clear or pale yellow.  Practice ways of relaxing, such as yoga, meditation, massage therapy, or acupuncture.  Exercise regularly.   Change situations that cause you stress. Try to keep your work and personal routine reasonable.  Do not abuse illegal drugs.  Limit alcohol intake to no more than 1 drink per day for nonpregnant women and 2 drinks per day for men. One drink equals 12 ounces of beer, 5 ounces of wine, or 1 ounces of hard liquor.  Take a multivitamin, if directed by your health care provider. SEEK MEDICAL CARE IF:   Your fatigue does not get better.  You have a fever.   You have unintentional weight loss or gain.  You have headaches.   You have difficulty:   Falling asleep.  Sleeping throughout the night.  You feel angry, guilty, anxious, or sad.   You are unable to have a bowel movement (constipation).   You skin is dry.   Your legs or another part of your body is  swollen.  SEEK IMMEDIATE MEDICAL CARE IF:   You feel confused.   Your vision is blurry.  You feel faint or pass out.   You have a severe headache.   You have severe abdominal, pelvic, or back pain.   You have chest pain, shortness of breath, or an irregular or fast heartbeat.   You are unable to urinate or you urinate less than normal.   You develop abnormal bleeding, such as bleeding from the rectum, vagina, nose, lungs, or nipples.  You vomit blood.   You have thoughts about harming yourself or committing suicide.   You are worried that you might harm someone else.    This information is not intended to replace advice given to you by your health care provider. Make sure you discuss any questions you have with your health care provider.   Document Released: 05/07/2007 Document Revised: 07/31/2014 Document Reviewed: 11/11/2013 Elsevier Interactive Patient Education 2016 Elsevier Inc.  Weakness Weakness is a lack of strength. It may be felt all over the body (generalized) or in one specific part of the body (focal). Some causes of weakness can be serious. You may need further medical evaluation, especially if you are elderly or you have a history of immunosuppression (such as chemotherapy or HIV), kidney disease, heart disease, or diabetes. CAUSES  Weakness can be caused by many different things, including:  Infection.  Physical exhaustion.  Internal bleeding or other blood loss that results in a lack of red  blood cells (anemia).  Dehydration. This cause is more common in elderly people.  Side effects or electrolyte abnormalities from medicines, such as pain medicines or sedatives.  Emotional distress, anxiety, or depression.  Circulation problems, especially severe peripheral arterial disease.  Heart disease, such as rapid atrial fibrillation, bradycardia, or heart failure.  Nervous system disorders, such as Guillain-Barr syndrome, multiple sclerosis,  or stroke. DIAGNOSIS  To find the cause of your weakness, your caregiver will take your history and perform a physical exam. Lab tests or X-rays may also be ordered, if needed. TREATMENT  Treatment of weakness depends on the cause of your symptoms and can vary greatly. HOME CARE INSTRUCTIONS   Rest as needed.  Eat a well-balanced diet.  Try to get some exercise every day.  Only take over-the-counter or prescription medicines as directed by your caregiver. SEEK MEDICAL CARE IF:   Your weakness seems to be getting worse or spreads to other parts of your body.  You develop new aches or pains. SEEK IMMEDIATE MEDICAL CARE IF:   You cannot perform your normal daily activities, such as getting dressed and feeding yourself.  You cannot walk up and down stairs, or you feel exhausted when you do so.  You have shortness of breath or chest pain.  You have difficulty moving parts of your body.  You have weakness in only one area of the body or on only one side of the body.  You have a fever.  You have trouble speaking or swallowing.  You cannot control your bladder or bowel movements.  You have black or bloody vomit or stools. MAKE SURE YOU:  Understand these instructions.  Will watch your condition.  Will get help right away if you are not doing well or get worse.   This information is not intended to replace advice given to you by your health care provider. Make sure you discuss any questions you have with your health care provider.   Document Released: 07/10/2005 Document Revised: 01/09/2012 Document Reviewed: 09/08/2011 Elsevier Interactive Patient Education [employer].

## 2015-12-17 NOTE — ED Provider Notes (Signed)
CSN: [id number]     Arrival date & time 12/17/15  1709 History   First MD Initiated Contact with Patient 12/17/15 1711     Chief Complaint  Patient presents with  . Weakness   (Consider location/radiation/quality/duration/timing/severity/associated sxs/prior Treatment) HPI Comments: 82 year old female presents to the urgent care with a two-week in a few days history of feeling tired and weak. She states this coincides with the onset of her starting Prozac. She has no other complaints although she states that after reading the medication literature that the Prozac may calls  weakness as well as dark stool. She states that her stools have been darker recently. Denies seeing blood in her stools. Review of systems is essentially negative other than the chief complaint. She does have left anterolateral lateral chest pain that started after she was leaning into the washing machine on her left chest wall to remove close. It is worse when she dresses on the left side of her chest. She is fully awake, alert, aware, oriented and apparently self-sufficient and able to perform her own ADLs. Her speech is lucid and articulate.   Past Medical History  Diagnosis Date  . Depression     treated in the past for years;stopped in 2010 for a years  . Panic attack   . Claustrophobia   . Fibromyalgia   . IBS (irritable bowel syndrome)     Dr. Benson Norway  . Glaucoma, narrow-angle     s/p laser surgery  . Duodenal ulcer 1962    h/o  . Hyperthyroidism     h/o x 2 years  . Shingles 1999    h/o  . Hypothyroid 9/08  . GERD (gastroesophageal reflux disease)   . Trochanteric bursitis 12/2008    bilateral  . Superficial thrombophlebitis 03/2009    RLE  . Bell's palsy 1966    ? right side facial droop  . Osteoporosis 10/11    Dr.Hawkes  . SLE (systemic lupus erythematosus) (HCC)     with arthralgia/myalgia, positive ANA centromere pattern, positive DNA, positive RNP, indeterminant anti-cardiolipin  antibody-Dr.Hawkes.  . Carotid artery disease ([facility]) 2010    on vascular screening;unchanged 2013.(could not tolerate simvastatin, no other statins tried)--<30% blockage bilat 07/2011  . Recurrent UTI     has cystocele-Dr.Grewal  . Chronic fatigue and malaise   . Chronic abdominal pain   . Frequent PVCs 07/2012    Seen by [facility]: benign, asymptomatic, normal EF  . Ocular migraine    Past Surgical History  Procedure Laterality Date  . Tonsillectomy  1946  . Vaginal hysterectomy  1971    and bladder repair.  Still has ovaries  . Cataract extraction, bilateral  1995, 1996  . Thyroidectomy, partial  09/2005    L nodule; Dr. Harlow Asa  . Upper gi endoscopy  06/27/12  . Flexible sigmoidoscopy    . Abdominal hysterectomy     Family History  Problem Relation Age of Onset  . Heart disease Mother   . Hypertension Mother   . Hypertension Sister   . Diabetes Maternal Grandfather   . Heart disease Brother   . Lung cancer Brother     lung   Social History  Substance Use Topics  . Smoking status: Never Smoker   . Smokeless tobacco: Never Used  . Alcohol Use: No   OB History    Gravida Para Term Preterm AB TAB SAB Ectopic Multiple Living   2 2        2  Review of Systems  Constitutional: Positive for activity change and fatigue. Negative for fever, chills, diaphoresis, appetite change and unexpected weight change.  HENT: Positive for postnasal drip. Negative for congestion, ear pain, sore throat, trouble swallowing and voice change.   Eyes: Negative.  Negative for visual disturbance.  Respiratory: Negative for cough, choking, chest tightness, shortness of breath and wheezing.   Cardiovascular: Positive for chest pain. Negative for palpitations and leg swelling.  Gastrointestinal: Negative for nausea, vomiting, abdominal pain, diarrhea, constipation, blood in stool and abdominal distention.  Genitourinary: Negative.  Negative for dysuria, frequency, hematuria, flank pain, vaginal  discharge, vaginal pain and pelvic pain.  Musculoskeletal: Positive for neck pain. Negative for joint swelling.       Complains of occasional soreness in the musculature of the neck for several weeks.  Skin: Negative.   Neurological: Negative.  Negative for dizziness, tremors, seizures, syncope, facial asymmetry, speech difficulty, light-headedness, numbness and headaches.  Hematological: Negative.   Psychiatric/Behavioral: The patient is nervous/anxious.     Allergies  Iodine; Levsin; Salmon; Shellfish allergy; Aspirin; Ciprofloxacin; Codeine; Darvocet; Demerol; Diphedryl; Doxycycline hyclate; Epinephrine; Erythromycin; Flexeril; Keflex; Latex; Prednisone; Pylera; Sulfa antibiotics; Xylocaine; Zoloft; Advil; Clarithromycin; and Penicillins  Home Medications   Prior to Admission medications   Medication Sig Start Date End Date Taking? Authorizing Provider  acetaminophen (TYLENOL) 650 MG CR tablet Take 650 mg by mouth as needed for pain. Reported on 09/06/2015   Yes Historical Provider, MD  ALPRAZolam Duanne Moron) 0.5 MG tablet Take 0.25 mg by mouth 2 (two) times daily as needed.    Yes Historical Provider, MD  FLUoxetine (PROZAC) 10 MG tablet Take 10 mg by mouth daily.   Yes Historical Provider, MD  levothyroxine (SYNTHROID) 25 MCG tablet Take 25 mcg by mouth daily before breakfast.   Yes Historical Provider, MD  AMBULATORY NON FORMULARY MEDICATION IBgard Take 1 capsule by mouth twice a day 10/13/15   Dartha Lodge, PA-C  ARTIFICIAL TEAR OP Apply 1-2 drops to eye 4 (four) times daily as needed (dry eyes).     Historical Provider, MD  b complex vitamins capsule Take 1 capsule by mouth as needed.     Historical Provider, MD  Cholecalciferol (VITAMIN D) 2000 UNITS tablet Take 2,000 Units by mouth daily.    Historical Provider, MD  Loperamide HCl (IMODIUM PO) Take 1 tablet by mouth as needed. Reported on 09/06/2015    Historical Provider, MD  omeprazole (PRILOSEC) 20 MG capsule Take 1 capsule (20 mg total)  by mouth every morning. Patient taking differently: Take 20 mg by mouth as needed.  09/22/15   Lori P Hvozdovic, PA-C  vitamin E 400 UNIT capsule Take 400 Units by mouth every Monday, Wednesday, and Friday. Reported on 09/06/2015    Historical Provider, MD   Meds Ordered and Administered this Visit  Medications - No data to display  BP 139/94 mmHg  Pulse 66  Temp(Src) 97.7 F (36.5 C)  Resp 16  SpO2 98% No data found.   Physical Exam  Constitutional: She is oriented to person, place, and time. She appears well-developed and well-nourished. No distress.  HENT:  Head: Normocephalic and atraumatic.  Nose: Nose normal.  Mouth/Throat: Oropharynx is clear and moist. No oropharyngeal exudate.  Oropharynx with minor erythema and clear PND.  Eyes: Conjunctivae and EOM are normal. Pupils are equal, round, and reactive to light.  Neck: Normal range of motion. Neck supple.  Cardiovascular: Normal rate and regular rhythm.  Exam reveals gallop.   Pulmonary/Chest: Effort  normal and breath sounds normal. No respiratory distress. She has no wheezes. She has no rales. She exhibits tenderness.  Tenderness to palpation to the left anterior chest wall. This is the same. area and which she had complained of having the chest pain and tenderness.  Abdominal: Soft. There is no tenderness. There is no guarding.  Genitourinary: Guaiac negative stool.  Musculoskeletal: Normal range of motion. She exhibits no edema or tenderness.  Lymphadenopathy:    She has no cervical adenopathy.  Neurological: She is alert and oriented to person, place, and time. No cranial nerve deficit. She exhibits normal muscle tone. Coordination normal.  Skin: Skin is warm and dry. No rash noted. She is not diaphoretic. No erythema.  Psychiatric: She has a normal mood and affect. Her behavior is normal. Judgment and thought content normal.  Nursing note and vitals reviewed.   ED Course  Procedures (including critical care  time)  Labs Review Labs Reviewed  POCT I-STAT, CHEM 8 - Abnormal; Notable for the following:    BUN 23 (*)    Glucose, Bld 103 (*)    Hemoglobin 15.3 (*)    All other components within normal limits  OCCULT BLOOD, POC DEVICE   Results for orders placed or performed during the hospital encounter of 12/17/15  I-STAT, chem 8  Result Value Ref Range   Sodium 141 135 - 145 mmol/L   Potassium 4.8 3.5 - 5.1 mmol/L   Chloride 105 101 - 111 mmol/L   BUN 23 (H) 6 - 20 mg/dL   Creatinine, Ser 0.70 0.44 - 1.00 mg/dL   Glucose, Bld 103 (H) 65 - 99 mg/dL   Calcium, Ion 1.16 1.13 - 1.30 mmol/L   TCO2 30 0 - 100 mmol/L   Hemoglobin 15.3 (H) 12.0 - 15.0 g/dL   HCT 45.0 36.0 - 46.0 %  Occult blood, poc device  Result Value Ref Range   Fecal Occult Bld NEGATIVE NEGATIVE     Imaging Review No results found.   Visual Acuity Review  Right Eye Distance:   Left Eye Distance:   Bilateral Distance:    Right Eye Near:   Left Eye Near:    Bilateral Near:         MDM   1. Other fatigue   2. Generalized weakness    The following medication adjustments are based on the patient's request on how to go about decreasing some of her medication such as the Prozac and alprazolam to see if they may be causing her tiredness and fatigue. This decision is up to the patient and her PCP or prescriber. She is to call the prescriber to check with them that the suggestion below was given to the patient. She is aware that she should not stop these medications abruptly and that there are adverse effects if that were to occur. No other findings suggest other reasons for tiredness and weakness. No evidence for neurologic or cardiovascular etiologies. We discussed options for adjusting medications. May take the Prozac 10 mg every other day for the next few days. If necessary may reduce alprazolam to one half dose. Do not stop taking either of these medications completely. Discussed this with your  physicians.    Janne Napoleon, NP 12/17/15 1824

## 2015-12-17 NOTE — Telephone Encounter (Signed)
Pt called the office in regards to concerns with black tarry stools and feeling tired, weak. Pt denies bright red stool, heart racing, SHOB and dizziness. She was recently started on Prozac capsules. Pt has reached out to her psychiatrist and he was not concerned with this as a side effect from Prozac. Advised pt to go to [facility] for CBC check or to E.R if sx worsen. She is agreeable.   Spoke with Dr. Tomi Bamberger- informed her of advise above and she is agreeable, questions if pt is taking iron? This could cause black tarry stools. Pt needs to call her GI provider and notify them to see if they could see her.   Contacted pt and informed her to go to UC. She is not taking Iron. Advised pt to go to UC on [address] by [facility]. Victorino December

## 2015-12-17 NOTE — ED Notes (Signed)
Patient states she has been feeling weak since she started taking Prozac 1 1/2 weeks ago, she called her primary physician and was told to come to Urgent care today to have Hemoglobin checked  No acute distress

## 2015-12-21 ENCOUNTER — Ambulatory Visit: Payer: Medicare Other | Admitting: Licensed Clinical Social Worker

## 2015-12-28 DIAGNOSIS — K921 Melena: Secondary | ICD-10-CM | POA: Diagnosis not present

## 2015-12-28 DIAGNOSIS — F322 Major depressive disorder, single episode, severe without psychotic features: Secondary | ICD-10-CM | POA: Diagnosis not present

## 2015-12-29 ENCOUNTER — Ambulatory Visit (INDEPENDENT_AMBULATORY_CARE_PROVIDER_SITE_OTHER): Payer: Medicare Other | Admitting: Licensed Clinical Social Worker

## 2015-12-29 DIAGNOSIS — F331 Major depressive disorder, recurrent, moderate: Secondary | ICD-10-CM | POA: Diagnosis not present

## 2016-01-04 ENCOUNTER — Telehealth: Payer: Self-pay

## 2016-01-04 NOTE — Telephone Encounter (Signed)
Records faxed to [facility] @ [place] per pt request. Tracy Malone

## 2016-01-07 DIAGNOSIS — F411 Generalized anxiety disorder: Secondary | ICD-10-CM | POA: Diagnosis not present

## 2016-01-12 ENCOUNTER — Ambulatory Visit: Payer: Medicare Other | Admitting: Licensed Clinical Social Worker

## 2016-01-12 DIAGNOSIS — M810 Age-related osteoporosis without current pathological fracture: Secondary | ICD-10-CM | POA: Diagnosis not present

## 2016-01-12 DIAGNOSIS — M329 Systemic lupus erythematosus, unspecified: Secondary | ICD-10-CM | POA: Diagnosis not present

## 2016-01-12 DIAGNOSIS — M797 Fibromyalgia: Secondary | ICD-10-CM | POA: Diagnosis not present

## 2016-01-12 DIAGNOSIS — M7581 Other shoulder lesions, right shoulder: Secondary | ICD-10-CM | POA: Diagnosis not present

## 2016-01-12 DIAGNOSIS — R769 Abnormal immunological finding in serum, unspecified: Secondary | ICD-10-CM | POA: Diagnosis not present

## 2016-01-12 DIAGNOSIS — N390 Urinary tract infection, site not specified: Secondary | ICD-10-CM | POA: Diagnosis not present

## 2016-01-14 ENCOUNTER — Ambulatory Visit (INDEPENDENT_AMBULATORY_CARE_PROVIDER_SITE_OTHER): Payer: Medicare Other | Admitting: Licensed Clinical Social Worker

## 2016-01-14 DIAGNOSIS — F419 Anxiety disorder, unspecified: Secondary | ICD-10-CM

## 2016-01-24 DIAGNOSIS — N390 Urinary tract infection, site not specified: Secondary | ICD-10-CM | POA: Diagnosis not present

## 2016-01-24 DIAGNOSIS — M79606 Pain in leg, unspecified: Secondary | ICD-10-CM | POA: Diagnosis not present

## 2016-01-31 ENCOUNTER — Ambulatory Visit (INDEPENDENT_AMBULATORY_CARE_PROVIDER_SITE_OTHER): Payer: Medicare Other | Admitting: Licensed Clinical Social Worker

## 2016-01-31 DIAGNOSIS — F331 Major depressive disorder, recurrent, moderate: Secondary | ICD-10-CM | POA: Diagnosis not present

## 2016-02-01 DIAGNOSIS — N390 Urinary tract infection, site not specified: Secondary | ICD-10-CM | POA: Diagnosis not present

## 2016-02-03 DIAGNOSIS — I83811 Varicose veins of right lower extremities with pain: Secondary | ICD-10-CM | POA: Diagnosis not present

## 2016-02-03 DIAGNOSIS — I83891 Varicose veins of right lower extremities with other complications: Secondary | ICD-10-CM | POA: Diagnosis not present

## 2016-02-07 DIAGNOSIS — M79604 Pain in right leg: Secondary | ICD-10-CM | POA: Diagnosis not present

## 2016-02-07 DIAGNOSIS — I8001 Phlebitis and thrombophlebitis of superficial vessels of right lower extremity: Secondary | ICD-10-CM | POA: Diagnosis not present

## 2016-02-10 DIAGNOSIS — E039 Hypothyroidism, unspecified: Secondary | ICD-10-CM | POA: Diagnosis not present

## 2016-02-11 ENCOUNTER — Ambulatory Visit (INDEPENDENT_AMBULATORY_CARE_PROVIDER_SITE_OTHER): Payer: Medicare Other | Admitting: Licensed Clinical Social Worker

## 2016-02-11 DIAGNOSIS — F331 Major depressive disorder, recurrent, moderate: Secondary | ICD-10-CM | POA: Diagnosis not present

## 2016-02-15 DIAGNOSIS — M25551 Pain in right hip: Secondary | ICD-10-CM | POA: Diagnosis not present

## 2016-02-15 DIAGNOSIS — M7061 Trochanteric bursitis, right hip: Secondary | ICD-10-CM | POA: Diagnosis not present

## 2016-02-15 DIAGNOSIS — M25552 Pain in left hip: Secondary | ICD-10-CM | POA: Diagnosis not present

## 2016-02-21 DIAGNOSIS — I868 Varicose veins of other specified sites: Secondary | ICD-10-CM | POA: Diagnosis not present

## 2016-02-21 DIAGNOSIS — R109 Unspecified abdominal pain: Secondary | ICD-10-CM | POA: Diagnosis not present

## 2016-02-21 DIAGNOSIS — R634 Abnormal weight loss: Secondary | ICD-10-CM | POA: Diagnosis not present

## 2016-02-25 ENCOUNTER — Ambulatory Visit (INDEPENDENT_AMBULATORY_CARE_PROVIDER_SITE_OTHER): Payer: Medicare Other | Admitting: Licensed Clinical Social Worker

## 2016-02-25 DIAGNOSIS — F3341 Major depressive disorder, recurrent, in partial remission: Secondary | ICD-10-CM | POA: Diagnosis not present

## 2016-02-27 DIAGNOSIS — R21 Rash and other nonspecific skin eruption: Secondary | ICD-10-CM | POA: Diagnosis not present

## 2016-02-27 DIAGNOSIS — K219 Gastro-esophageal reflux disease without esophagitis: Secondary | ICD-10-CM | POA: Diagnosis not present

## 2016-02-28 DIAGNOSIS — Z1211 Encounter for screening for malignant neoplasm of colon: Secondary | ICD-10-CM | POA: Diagnosis not present

## 2016-03-01 ENCOUNTER — Encounter: Payer: Self-pay | Admitting: Neurology

## 2016-03-01 ENCOUNTER — Ambulatory Visit (INDEPENDENT_AMBULATORY_CARE_PROVIDER_SITE_OTHER): Payer: Medicare Other | Admitting: Neurology

## 2016-03-01 DIAGNOSIS — R21 Rash and other nonspecific skin eruption: Secondary | ICD-10-CM | POA: Insufficient documentation

## 2016-03-01 DIAGNOSIS — G250 Essential tremor: Secondary | ICD-10-CM | POA: Diagnosis not present

## 2016-03-01 DIAGNOSIS — F411 Generalized anxiety disorder: Secondary | ICD-10-CM

## 2016-03-01 DIAGNOSIS — M79671 Pain in right foot: Secondary | ICD-10-CM | POA: Diagnosis not present

## 2016-03-01 DIAGNOSIS — M79672 Pain in left foot: Secondary | ICD-10-CM | POA: Diagnosis not present

## 2016-03-01 DIAGNOSIS — M7551 Bursitis of right shoulder: Secondary | ICD-10-CM | POA: Diagnosis not present

## 2016-03-01 DIAGNOSIS — M79605 Pain in left leg: Secondary | ICD-10-CM | POA: Diagnosis not present

## 2016-03-01 DIAGNOSIS — M79604 Pain in right leg: Secondary | ICD-10-CM | POA: Diagnosis not present

## 2016-03-01 DIAGNOSIS — R109 Unspecified abdominal pain: Secondary | ICD-10-CM | POA: Diagnosis not present

## 2016-03-01 MED ORDER — PREDNISONE 10 MG (21) PO TBPK: 10.0000 mg | ORAL_TABLET | Freq: Every day | ORAL | 0 refills | Status: DC

## 2016-03-01 NOTE — Progress Notes (Signed)
[facility]  PATIENT: [place] DOB: [birth year]  REFERRING DOCTOR OR PCP:  Joselyn Arrow SOURCE: patient and EMR records  _________________________________   HISTORICAL  CHIEF COMPLAINT:  Chief Complaint  Patient presents with  . Tremors  . Shoulder Pain    HISTORY OF PRESENT ILLNESS:  [place] is an 82 year old woman with tremor and right shoulder pain.   She was placed on Dexilant for GERD 12-14 days ago.   After 4 doses, she had redness and pain in her feet and ankles without any edema/swelling.  Her heels felt like walking on pebbles.  Urgent Care told her she was having a reaction to the medication and told her to stop.    She stopped 4 days ago (6 doses total) and the rash is improving but she does not note any improvement in pain symptoms.  Her feet are still burning a lot.     Tremor:   She feels her tremor is about the same.   She first noted a right hand tremor in late 2015.    She notes the tremor only in the right hand. Left hand not really involved.   She specifically notes it when she is holding a utensil like a spoon or fork. She has gotten heavier utensils and a thicker pen with benefit.   She takes Xanax for anxiety, 0.25 mg twice a day and  her tremor is better for a few hours after each pill. However, it makes her sleepy.   Her tremor will worsen if she is anxious or someone is watchiing her.   Tremor is not present when asleep.    She has not noted the tremor in the left arm or in the head.   She has not had any difficulty with her gait or balance. There have been no falls.   She has no family history of tremors.  Shoulder pain:   She feels the right shoulder and upper arm resolved with the shot at the last visit - subacromial bursa injection   REVIEW OF SYSTEMS: Constitutional: No fevers, chills, sweats, or change in appetite Eyes: No visual changes, double vision, eye pain Ear, nose and throat: No hearing loss, ear pain, nasal  congestion, sore throat Cardiovascular: No chest pain, palpitations.  Has had palpitations Respiratory: No shortness of breath at rest or with exertion.   No wheezes GastrointestinaI: No nausea, vomiting, diarrhea, abdominal pain, fecal incontinence Genitourinary: No dysuria, urinary retention or frequency.  No nocturia. Musculoskeletal: No neck pain, back pain Integumentary: No rash, pruritus, skin lesions Neurological: as above Psychiatric: No depression at this time.  Some anxiety Endocrine: No palpitations, diaphoresis, change in appetite, change in weigh or increased thirst Hematologic/Lymphatic: No anemia, purpura, petechiae. Allergic/Immunologic: No itchy/runny eyes, nasal congestion, recent allergic reactions, rashes  ALLERGIES: Allergies  Allergen Reactions  . Iodine Anaphylaxis  . Levsin [Hyoscyamine Sulfate]     Vision problems/pt has glaucoma  . Salmon [Fish Allergy] Hives and Shortness Of Breath  . Shellfish Allergy Anaphylaxis  . Aspirin Other (See Comments)    Sever stomach pain due to ulcer scaring.  . Ciprofloxacin Diarrhea  . Codeine Nausea And Vomiting  . Darvocet [Propoxyphene N-Acetaminophen] Nausea And Vomiting  . Demerol [Meperidine] Nausea Only  . Dexilant [Dexlansoprazole] Swelling  . Diphedryl [Diphenhydramine] Other (See Comments)    Increased pulse/small amount ok  . Doxycycline Hyclate Other (See Comments)    GI intolerance.  Marland Kitchen Epinephrine Other (See Comments)  Breathing problems  . Erythromycin Other (See Comments)    GI intolerance.  [name] [Cyclobenzaprine] Other (See Comments)    Tingly/prickly sensation.  Marland Kitchen Keflex [Cephalexin] Hives  . Latex Other (See Comments)    Gloves ok  . Prednisone Other (See Comments)    Headache  . Pylera [Bis Subcit-Metronid-Tetracyc] Swelling    Tongue swelling. Face tingling  . Sulfa Antibiotics Other (See Comments)    Increased pulse, fainting, diarrhea, thrush  . Xylocaine [Lidocaine Hcl]      With epinephrine, given by dentist.  Speeded up heart rate and she passed out (occured twice, at dentist)  . Zoloft [Sertraline Hcl] Swelling and Other (See Comments)    Migraine Swelling of tongue/lip (09/2012)  . Advil [Ibuprofen] Other (See Comments)    Motrin ok with a GI effect.  . Clarithromycin Rash    Started after completing 10 day course of 2000 mg /day  . Penicillins Hives and Rash    HOME MEDICATIONS:  Current Outpatient Prescriptions:  .  acetaminophen (TYLENOL) 650 MG CR tablet, Take 650 mg by mouth as needed for pain. Reported on 09/06/2015, Disp: , Rfl:  .  ALPRAZolam (XANAX) 0.5 MG tablet, Take 0.25 mg by mouth 2 (two) times daily as needed. , Disp: , Rfl:  .  AMBULATORY NON FORMULARY MEDICATION, IBgard Take 1 capsule by mouth twice a day, Disp: 12 capsule, Rfl: 0 .  ARTIFICIAL TEAR OP, Apply 1-2 drops to eye 4 (four) times daily as needed (dry eyes). , Disp: , Rfl:  .  b complex vitamins capsule, Take 1 capsule by mouth as needed. , Disp: , Rfl:  .  Cholecalciferol (VITAMIN D) 2000 UNITS tablet, Take 2,000 Units by mouth daily., Disp: , Rfl:  .  levothyroxine (SYNTHROID) 25 MCG tablet, Take 50 mcg by mouth daily before breakfast. , Disp: , Rfl:  .  vitamin E 400 UNIT capsule, Take 400 Units by mouth every Monday, Wednesday, and Friday. Reported on 09/06/2015, Disp: , Rfl:  .  FLUoxetine (PROZAC) 10 MG tablet, Take 10 mg by mouth daily., Disp: , Rfl:  .  Loperamide HCl (IMODIUM PO), Take 1 tablet by mouth as needed. Reported on 09/06/2015, Disp: , Rfl:  .  omeprazole (PRILOSEC) 20 MG capsule, Take 1 capsule (20 mg total) by mouth every morning. (Patient not taking: Reported on 03/01/2016), Disp: 30 capsule, Rfl: 3 .  predniSONE (STERAPRED UNI-PAK 21 TAB) 10 MG (21) TBPK tablet, Take 1 tablet (10 mg total) by mouth daily. Taper from 6 to 1 pill po daily over 6 days, Disp: 21 tablet, Rfl: 0  PAST MEDICAL HISTORY: Past Medical History:  Diagnosis Date  . Bell's palsy 1966   ?  right side facial droop  . Carotid artery disease (HCC) 2010   on vascular screening;unchanged 2013.(could not tolerate simvastatin, no other statins tried)--<30% blockage bilat 07/2011  . Chronic abdominal pain   . Chronic fatigue and malaise   . Claustrophobia   . Depression    treated in the past for years;stopped in 2010 for a years  . Duodenal ulcer 1962   h/o  . Fibromyalgia   . Frequent PVCs 07/2012   Seen by [facility]: benign, asymptomatic, normal EF  . GERD (gastroesophageal reflux disease)   . Glaucoma, narrow-angle    s/p laser surgery  . Hyperthyroidism    h/o x 2 years  . Hypothyroid 9/08  . IBS (irritable bowel syndrome)    Dr. Elnoria Howard  . Ocular migraine   .  Osteoporosis 10/11   Dr.Hawkes  . Panic attack   . Recurrent UTI    has cystocele-Dr.Grewal  . Shingles 1999   h/o  . SLE (systemic lupus erythematosus) (HCC)    with arthralgia/myalgia, positive ANA centromere pattern, positive DNA, positive RNP, indeterminant anti-cardiolipin antibody-Dr.Hawkes.  . Superficial thrombophlebitis 03/2009   RLE  . Trochanteric bursitis 12/2008   bilateral    PAST SURGICAL HISTORY: Past Surgical History:  Procedure Laterality Date  . ABDOMINAL HYSTERECTOMY    . CATARACT EXTRACTION, BILATERAL  1995, 1996  . Flexible sigmoidoscopy    . THYROIDECTOMY, PARTIAL  09/2005   L nodule; Dr. Gerrit Friends  . TONSILLECTOMY  1946  . UPPER GI ENDOSCOPY  06/27/12  . VAGINAL HYSTERECTOMY  1971   and bladder repair.  Still has ovaries    FAMILY HISTORY: Family History  Problem Relation Age of Onset  . Heart disease Mother   . Hypertension Mother   . Hypertension Sister   . Diabetes Maternal Grandfather   . Heart disease Brother   . Lung cancer Brother     lung    SOCIAL HISTORY:  Social History   Social History  . Marital status: Married    Spouse name: N/A  . Number of children: 2  . Years of education: N/A   Occupational History  . retired (school system)    Social  History Main Topics  . Smoking status: Never Smoker  . Smokeless tobacco: Never Used  . Alcohol use No  . Drug use: No  . Sexual activity: No   Other Topics Concern  . Not on file   Social History Narrative   Married.  Son lives in [place]; Daughter [name] lives in [place]; 2 grandchildren     PHYSICAL EXAM  There were no vitals filed for this visit.  There is no height or weight on file to calculate BMI.   General: The patient is well-developed and well-nourished and in no acute distress  Musculoskeletal:  She is nontender over the subacromial bursae and the glenohumeral joints of the shoulders with good ROM.     Feet/ankles tender to deep palpation  Skin:   No edema.    Mild discoloration spots in ankle region bilaterally up to mid-shin.    Neurologic Exam  Mental status: The patient is alert and oriented x 3 at the time of the examination. The patient has apparent normal recent and remote memory, with an apparently normal attention span and concentration ability.   Speech is normal.  Cranial nerves: Extraocular movements are full.   Facial strength is normal.  Trapezius and sternocleidomastoid strength is normal. No dysarthria is noted.    Motor:  There is no bradykinesia. Fast 7 hz Tremor increases with sustained posture.   Muscle bulk is normal.   Tone is normal. Strength is  5 / 5 in all 4 extremities.   Sensory: Sensory testing is intact to touch and vibration sensation in all 4 extremities.  Coordination: Cerebellar testing reveals good finger-nose-finger bilaterally.  Gait and station: Station is normal.   Gait is normal for age. Tandem gait is normal. No retropulsion with mild disturbance of posture.  Mild retropulsion when posture disturbed more. Romberg is negative.   Reflexes: Deep tendon reflexes are symmetric and normal bilaterally.    DIAGNOSTIC DATA (LABS, IMAGING, TESTING) - I reviewed patient records, labs, notes, testing and imaging myself where  available.  Lab Results  Component Value Date   WBC 11.8 (H) 09/29/2015  HGB 15.3 (H) 12/17/2015   HCT 45.0 12/17/2015   MCV 89.3 09/29/2015   PLT 268.0 09/29/2015      Component Value Date/Time   NA 141 12/17/2015 1757   K 4.8 12/17/2015 1757   CL 105 12/17/2015 1757   CO2 27 02/01/2015 0001   GLUCOSE 103 (H) 12/17/2015 1757   BUN 23 (H) 12/17/2015 1757   CREATININE 0.70 12/17/2015 1757   CREATININE 0.83 02/01/2015 0001   CALCIUM 9.3 02/01/2015 0001   PROT 7.0 09/29/2015 1216   ALBUMIN 4.0 09/29/2015 1216   AST 17 09/29/2015 1216   ALT 13 09/29/2015 1216   ALKPHOS 68 09/29/2015 1216   BILITOT 0.9 09/29/2015 1216   GFRNONAA 77 (L) 07/04/2013 1045   GFRAA 90 (L) 07/04/2013 1045   Lab Results  Component Value Date   CHOL 222 (H) 05/04/2015   HDL 77 05/04/2015   LDLCALC 121 05/04/2015   TRIG 121 05/04/2015   CHOLHDL 2.9 05/04/2015   No results found for: HGBA1C No results found for: VITAMINB12 Lab Results  Component Value Date   TSH 1.817 02/01/2015       ASSESSMENT AND PLAN  Essential tremor  Subacromial bursitis, right  Rash and nonspecific skin eruption  Leg pain, bilateral  Anxiety state  1.   Can continue Xanax for tremor.   She prefers not to add another medication at this time but I would consider a beta blocker.     Most likely tremor is benign essential tremor (rather than Parkinson's) as no bradykinesia, no cogwheeling and stable gait 2.   Stay active and exercise as tolerated. 3.   For pain in feet and possible drug reaction, I'll have her try a steroid pack.  Tylenol prn for the pain 4.   She will return to see me in 6 months or call sooner if there are new or worsening neurologic symptoms.   Bernardino Dowell A. Epimenio Foot, MD, PhD 03/01/2016, 12:53 PM Certified in Neurology, Clinical Neurophysiology, Sleep Medicine, Pain Medicine and Neuroimaging  [facility] [address] [place], [place] [address] [phone]

## 2016-03-02 ENCOUNTER — Telehealth: Payer: Self-pay | Admitting: Gastroenterology

## 2016-03-03 NOTE — Telephone Encounter (Signed)
Left message on machine to call back  

## 2016-03-06 DIAGNOSIS — F411 Generalized anxiety disorder: Secondary | ICD-10-CM | POA: Diagnosis not present

## 2016-03-06 DIAGNOSIS — Z79899 Other long term (current) drug therapy: Secondary | ICD-10-CM | POA: Diagnosis not present

## 2016-03-07 NOTE — Telephone Encounter (Signed)
Pt has bloating and swelling x 2 months.  Diarrhea ( up to 5 times daily) that she takes imodium for which does help.  No blood, saw PCP and was heme negative.  No recent abx.  IBgard has not helped.  Weight loss of 6 pounds in 2 weeks.  Early satiety.  Has a history of IBS.  No other medications for IBS.  Appt for 05/22/16 is this ok?  Are there any recommendations prior to the appt?

## 2016-03-07 NOTE — Telephone Encounter (Signed)
The pt was notified to NOT take Levsin and begin gas ex with every meal and scheduled imodium.  Pt agreed and verbalized understanding and will call with an update.

## 2016-03-07 NOTE — Telephone Encounter (Signed)
Dr Ardis Hughs the pt has an allergy to levsin?  Please advise

## 2016-03-07 NOTE — Telephone Encounter (Signed)
Ok, then advise she take one gas ex pill with every meal.  Still stay on imodium on scheduled basis.  Thanks

## 2016-03-07 NOTE — Telephone Encounter (Signed)
She should continue imodium, but should take it every morning shortly after waking on a scheduled basis.   She should also be on twice daily levbid (.375mg ), rx 60 pills with 3 refills.  She should call back in 4-5 weeks to report on her response  That rov date is fine, but but on wait list for earlier one with me if a spot opens up.  Thanks

## 2016-03-08 DIAGNOSIS — R1013 Epigastric pain: Secondary | ICD-10-CM | POA: Diagnosis not present

## 2016-03-14 ENCOUNTER — Ambulatory Visit (INDEPENDENT_AMBULATORY_CARE_PROVIDER_SITE_OTHER): Payer: Medicare Other | Admitting: Licensed Clinical Social Worker

## 2016-03-14 DIAGNOSIS — F331 Major depressive disorder, recurrent, moderate: Secondary | ICD-10-CM | POA: Diagnosis not present

## 2016-03-21 DIAGNOSIS — F411 Generalized anxiety disorder: Secondary | ICD-10-CM | POA: Diagnosis not present

## 2016-03-31 ENCOUNTER — Ambulatory Visit (INDEPENDENT_AMBULATORY_CARE_PROVIDER_SITE_OTHER): Payer: Medicare Other | Admitting: Licensed Clinical Social Worker

## 2016-03-31 DIAGNOSIS — F3341 Major depressive disorder, recurrent, in partial remission: Secondary | ICD-10-CM

## 2016-04-06 DIAGNOSIS — I8311 Varicose veins of right lower extremity with inflammation: Secondary | ICD-10-CM | POA: Diagnosis not present

## 2016-04-06 DIAGNOSIS — I8001 Phlebitis and thrombophlebitis of superficial vessels of right lower extremity: Secondary | ICD-10-CM | POA: Diagnosis not present

## 2016-04-13 ENCOUNTER — Other Ambulatory Visit (HOSPITAL_COMMUNITY): Payer: Medicare Other | Attending: Psychiatry | Admitting: Psychiatry

## 2016-04-13 ENCOUNTER — Encounter (HOSPITAL_COMMUNITY): Payer: Self-pay | Admitting: Psychiatry

## 2016-04-13 DIAGNOSIS — Z833 Family history of diabetes mellitus: Secondary | ICD-10-CM | POA: Diagnosis not present

## 2016-04-13 DIAGNOSIS — Z801 Family history of malignant neoplasm of trachea, bronchus and lung: Secondary | ICD-10-CM | POA: Diagnosis not present

## 2016-04-13 DIAGNOSIS — K589 Irritable bowel syndrome without diarrhea: Secondary | ICD-10-CM | POA: Insufficient documentation

## 2016-04-13 DIAGNOSIS — Z9841 Cataract extraction status, right eye: Secondary | ICD-10-CM | POA: Diagnosis not present

## 2016-04-13 DIAGNOSIS — Z881 Allergy status to other antibiotic agents status: Secondary | ICD-10-CM | POA: Insufficient documentation

## 2016-04-13 DIAGNOSIS — M329 Systemic lupus erythematosus, unspecified: Secondary | ICD-10-CM | POA: Diagnosis not present

## 2016-04-13 DIAGNOSIS — Z885 Allergy status to narcotic agent status: Secondary | ICD-10-CM | POA: Diagnosis not present

## 2016-04-13 DIAGNOSIS — Z79890 Hormone replacement therapy (postmenopausal): Secondary | ICD-10-CM | POA: Diagnosis not present

## 2016-04-13 DIAGNOSIS — Z880 Allergy status to penicillin: Secondary | ICD-10-CM | POA: Diagnosis not present

## 2016-04-13 DIAGNOSIS — Z8249 Family history of ischemic heart disease and other diseases of the circulatory system: Secondary | ICD-10-CM | POA: Diagnosis not present

## 2016-04-13 DIAGNOSIS — E039 Hypothyroidism, unspecified: Secondary | ICD-10-CM | POA: Insufficient documentation

## 2016-04-13 DIAGNOSIS — Z79899 Other long term (current) drug therapy: Secondary | ICD-10-CM | POA: Diagnosis not present

## 2016-04-13 DIAGNOSIS — Z9842 Cataract extraction status, left eye: Secondary | ICD-10-CM | POA: Insufficient documentation

## 2016-04-13 DIAGNOSIS — M810 Age-related osteoporosis without current pathological fracture: Secondary | ICD-10-CM | POA: Diagnosis not present

## 2016-04-13 DIAGNOSIS — Z90710 Acquired absence of both cervix and uterus: Secondary | ICD-10-CM | POA: Diagnosis not present

## 2016-04-13 DIAGNOSIS — Z91013 Allergy to seafood: Secondary | ICD-10-CM | POA: Insufficient documentation

## 2016-04-13 DIAGNOSIS — K219 Gastro-esophageal reflux disease without esophagitis: Secondary | ICD-10-CM | POA: Insufficient documentation

## 2016-04-13 DIAGNOSIS — F332 Major depressive disorder, recurrent severe without psychotic features: Secondary | ICD-10-CM | POA: Insufficient documentation

## 2016-04-13 DIAGNOSIS — Z91040 Latex allergy status: Secondary | ICD-10-CM | POA: Diagnosis not present

## 2016-04-13 DIAGNOSIS — I2510 Atherosclerotic heart disease of native coronary artery without angina pectoris: Secondary | ICD-10-CM | POA: Insufficient documentation

## 2016-04-13 NOTE — Progress Notes (Signed)
Psychiatric Initial Adult Assessment   Patient Identification: [place] Mal[place] MRN:  [id number] Date of Evaluation:  04/13/2016 Referral Source: Dr Casimiro Needle Chief Complaint:depression and anxiety not responding to medication and anxiety  Visit Diagnosis:    ICD-9-CM ICD-10-CM   1. Severe recurrent major depression without psychotic features ([place]) 296.33 F33.2     History of Present Illness:  Ms [place] has been depressed for many years.  Zoloft was helpful for many years but stopped working.  No other medication has helped including augmentation with other meds.  She does have hypothyroidism but says her replacement hormones are checked regularly.  Depression has been worse for the last 8 years or so for no apparent reason as her life is good.  Sertraline was tried again without help and seemed to make her feel worse.  She has a good marriage but does have worries about her son who has chronic drug problems.  Her childhood was good.  No trauma or stressors to precipitate the depression.  She has always had generalized anxiety and that preceded the depression but both are present now.   She has no metal implants, ho history of seizures, no bipolar diagnosis and no psychosis.  She has never undergone ECT. Depressions scales today are consistent with severe depression  Associated Signs/Symptoms: Depression Symptoms:  depressed mood, anhedonia, insomnia, fatigue, feelings of worthlessness/guilt, difficulty concentrating, hopelessness, impaired memory, anxiety, decreased appetite, (Hypo) Manic Symptoms:  Irritable Mood, Anxiety Symptoms:  Excessive Worry, Psychotic Symptoms:  none PTSD Symptoms: Negative  Past Psychiatric History: outpatient therapy and medication for years.  No inpatient treatment  Previous Psychotropic Medications: Yes   Substance Abuse History in the last 12 months:  No.  Consequences of Substance Abuse: Negative  Past Medical History:  Past Medical History:   Diagnosis Date  . Bell's palsy 1966   ? right side facial droop  . Carotid artery disease ([place]) 2010   on vascular screening;unchanged 2013.(could not tolerate simvastatin, no other statins tried)--<30% blockage bilat 07/2011  . Chronic abdominal pain   . Chronic fatigue and malaise   . Claustrophobia   . Depression    treated in the past for years;stopped in 2010 for a years  . Duodenal ulcer 1962   h/o  . Fibromyalgia   . 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 GERD (gastroesophageal reflux disease)   . Glaucoma, narrow-angle    s/p laser surgery  . Hyperthyroidism    h/o x 2 years  . Hypothyroid 9/08  . IBS (irritable bowel syndrome)    Dr. Benson Norway  . Ocular migraine   . Osteoporosis 10/11   Dr.Hawkes  . Panic attack   . Recurrent UTI    has cystocele-Dr.Grewal  . Shingles 1999   h/o  . SLE (systemic lupus erythematosus) (HCC)    with arthralgia/myalgia, positive ANA centromere pattern, positive DNA, positive RNP, indeterminant anti-cardiolipin antibody-Dr.Hawkes.  . Superficial thrombophlebitis 03/2009   RLE  . Trochanteric bursitis 12/2008   bilateral    Past Surgical History:  Procedure Laterality Date  . ABDOMINAL HYSTERECTOMY    . CATARACT EXTRACTION, BILATERAL  1995, 1996  . Flexible sigmoidoscopy    . THYROIDECTOMY, PARTIAL  09/2005   L nodule; Dr. Harlow Asa  . TONSILLECTOMY  1946  . UPPER GI ENDOSCOPY  06/27/12  . VAGINAL HYSTERECTOMY  1971   and bladder repair.  Still has ovaries    Family Psychiatric History: good family, no known mental health  issues  Family History:  Family History  Problem Relation Age of Onset  . Heart disease Mother   . Hypertension Mother   . Hypertension Sister   . Diabetes Maternal Grandfather   . Heart disease Brother   . Lung cancer Brother     lung    Social History:   Social History   Social History  . Marital status: Married    Spouse name: N/A  . Number of children: 2   . Years of education: N/A   Occupational History  . retired (school system)    Social History Main Topics  . Smoking status: Never Smoker  . Smokeless tobacco: Never Used  . Alcohol use No  . Drug use: No  . Sexual activity: No   Other Topics Concern  . None   Social History Narrative   Married.  Son lives in [place]; Daughter [place] lives in [place]; 2 grandchildren    Additional Social History: none  Allergies:   Allergies  Allergen Reactions  . Iodine Anaphylaxis  . Levsin [Hyoscyamine Sulfate]     Vision problems/pt has glaucoma  . Salmon [Fish Allergy] Hives and Shortness Of Breath  . Shellfish Allergy Anaphylaxis  . Aspirin Other (See Comments)    Sever stomach pain due to ulcer scaring.  . Ciprofloxacin Diarrhea  . Codeine Nausea And Vomiting  . Darvocet [Propoxyphene N-Acetaminophen] Nausea And Vomiting  . Demerol [Meperidine] Nausea Only  . Dexilant [Dexlansoprazole] Swelling  . Diphedryl [Diphenhydramine] Other (See Comments)    Increased pulse/small amount ok  . Doxycycline Hyclate Other (See Comments)    GI intolerance.  Marland Kitchen Epinephrine Other (See Comments)    Breathing problems  . Erythromycin Other (See Comments)    GI intolerance.  Yvette Rack [Cyclobenzaprine] Other (See Comments)    Tingly/prickly sensation.  Marland Kitchen Keflex [Cephalexin] Hives  . Latex Other (See Comments)    Gloves ok  . Prednisone Other (See Comments)    Headache  . Pylera [Bis Subcit-Metronid-Tetracyc] Swelling    Tongue swelling. Face tingling  . Sulfa Antibiotics Other (See Comments)    Increased pulse, fainting, diarrhea, thrush  . Xylocaine [Lidocaine Hcl]     With epinephrine, given by dentist.  Speeded up heart rate and she passed out (occured twice, at dentist)  . Zoloft [Sertraline Hcl] Swelling and Other (See Comments)    Migraine Swelling of tongue/lip (09/2012)  . Advil [Ibuprofen] Other (See Comments)    Motrin ok with a GI effect.  . Clarithromycin Rash    Started  after completing 10 day course of 2000 mg /day  . Penicillins Hives and Rash    Metabolic Disorder Labs: No results found for: HGBA1C, MPG No results found for: PROLACTIN Lab Results  Component Value Date   CHOL 222 (H) 05/04/2015   TRIG 121 05/04/2015   HDL 77 05/04/2015   CHOLHDL 2.9 05/04/2015   VLDL 24 05/04/2015   LDLCALC 121 05/04/2015   LDLCALC 114 (H) 01/01/2013     Current Medications: Current Outpatient Prescriptions  Medication Sig Dispense Refill  . acetaminophen (TYLENOL) 650 MG CR tablet Take 650 mg by mouth as needed for pain. Reported on 09/06/2015    . ALPRAZolam (XANAX) 0.5 MG tablet Take 0.25 mg by mouth 2 (two) times daily as needed.     . AMBULATORY NON FORMULARY MEDICATION IBgard Take 1 capsule by mouth twice a day 12 capsule 0  . ARTIFICIAL TEAR OP Apply 1-2 drops to eye 4 (four) times daily as  needed (dry eyes).     Marland Kitchen b complex vitamins capsule Take 1 capsule by mouth as needed.     . Cholecalciferol (VITAMIN D) 2000 UNITS tablet Take 2,000 Units by mouth daily.    Marland Kitchen FLUoxetine (PROZAC) 10 MG tablet Take 10 mg by mouth daily.    Marland Kitchen levothyroxine (SYNTHROID) 25 MCG tablet Take 50 mcg by mouth daily before breakfast.     . Loperamide HCl (IMODIUM PO) Take 1 tablet by mouth as needed. Reported on 09/06/2015    . omeprazole (PRILOSEC) 20 MG capsule Take 1 capsule (20 mg total) by mouth every morning. (Patient not taking: Reported on 03/01/2016) 30 capsule 3  . predniSONE (STERAPRED UNI-PAK 21 TAB) 10 MG (21) TBPK tablet Take 1 tablet (10 mg total) by mouth daily. Taper from 6 to 1 pill po daily over 6 days 21 tablet 0  . vitamin E 400 UNIT capsule Take 400 Units by mouth every Monday, Wednesday, and Friday. Reported on 09/06/2015     No current facility-administered medications for this visit.     Neurologic: Headache: Negative Seizure: Negative Paresthesias:NegativeMusculoskeletal: patient does have a significant resting tremor in her right hand and arm.   Neurologist says it is idiopathic she says Strength & Muscle Tone: within normal limits Gait & Station: normal Patient leans: N/A  Psychiatric Specialty Exam: ROS  There were no vitals taken for this visit.There is no height or weight on file to calculate BMI.  General Appearance: Well Groomed  Eye Contact:  Good  Speech:  Clear and Coherent  Volume:  Normal  Mood:  Depressed  Affect:  Congruent  Thought Process:  Coherent and Goal Directed  Orientation:  Full (Time, Place, and Person)  Thought Content:  Logical  Suicidal Thoughts:  No  Homicidal Thoughts:  No  Memory:  Immediate;   Good Recent;   Good Remote;   Good  Judgement:  Good  Insight:  Good  Psychomotor Activity:  Normal  Concentration:  Concentration: Good and Attention Span: Good  Recall:  Good  Fund of Knowledge:Good  Language: Good  Akathisia:  Negative  Handed:  Right  AIMS (if indicated):  0  Assets:  Communication Skills Desire for Improvement Financial Resources/Insurance Housing Intimacy Leisure Time Physical Health Resilience Social Support Talents/Skills Transportation Vocational/Educational  ADL's:  Intact  Cognition: WNL  Sleep:  poor    Treatment Plan Summary: Meets criteria for [facility] therapy.  Discussed ECT and ketamine therapy   Donnelly Angelica, MD 9/21/20173:10 PM

## 2016-04-17 ENCOUNTER — Encounter: Payer: Self-pay | Admitting: Vascular Surgery

## 2016-04-17 DIAGNOSIS — R7303 Prediabetes: Secondary | ICD-10-CM | POA: Diagnosis not present

## 2016-04-17 DIAGNOSIS — R1013 Epigastric pain: Secondary | ICD-10-CM | POA: Diagnosis not present

## 2016-04-17 DIAGNOSIS — Z79899 Other long term (current) drug therapy: Secondary | ICD-10-CM | POA: Diagnosis not present

## 2016-04-17 DIAGNOSIS — Z0000 Encounter for general adult medical examination without abnormal findings: Secondary | ICD-10-CM | POA: Diagnosis not present

## 2016-04-20 DIAGNOSIS — H1032 Unspecified acute conjunctivitis, left eye: Secondary | ICD-10-CM | POA: Diagnosis not present

## 2016-04-25 DIAGNOSIS — Z01419 Encounter for gynecological examination (general) (routine) without abnormal findings: Secondary | ICD-10-CM | POA: Diagnosis not present

## 2016-04-25 DIAGNOSIS — Z1231 Encounter for screening mammogram for malignant neoplasm of breast: Secondary | ICD-10-CM | POA: Diagnosis not present

## 2016-04-25 DIAGNOSIS — N390 Urinary tract infection, site not specified: Secondary | ICD-10-CM | POA: Diagnosis not present

## 2016-04-25 DIAGNOSIS — Z6822 Body mass index (BMI) 22.0-22.9, adult: Secondary | ICD-10-CM | POA: Diagnosis not present

## 2016-04-26 ENCOUNTER — Other Ambulatory Visit (HOSPITAL_COMMUNITY): Payer: Medicare Other | Attending: Psychiatry | Admitting: Psychiatry

## 2016-04-26 DIAGNOSIS — F332 Major depressive disorder, recurrent severe without psychotic features: Secondary | ICD-10-CM

## 2016-04-26 NOTE — Progress Notes (Signed)
Ms Amburgy reported for [facility].  Because of a pronounced tremor in her right hand the determinations were made from the right side of her head.  There was a noticeable in the left hand as well.  We were assisted by Justice Rocher the representative of the company and determined the following parameters.  SOA 30, A/P 10.5, coil angle 5 degrees and SMT 1.24. Ms Kolm tolerated the procedure well and the first treatment followed.

## 2016-04-26 NOTE — Progress Notes (Signed)
Pt reported to [facility] for cortical mapping and motor threshold determination for Repetitive Transcranial Magnetic Stimulation treatment for Major Depressive Disorder. Pt completed a PHQ-9 with a score of 21 ( severe depression). Pt also completed a Beck's Depression Inventory with a score of 46 (severe depression). Prior to procedure, pt signed an informed consent agreement for [place] treatment. Pt's treatment area was found by applying single pulses to her right motor cortex, hunting along the anterior/posterior plane and along the superior oblique angle until the best motor response was elicited from the pt's right thumb. Due to her tremors, the positioning was done on the right side. The best response was observed at 5.0 A/P and 30degrees SOA, with a coil angle of - 5degrees. Pt's motor threshold was calculated using the [employer]'s proprietary MT Assist algorithm, which produced a calculated motor threshold of 1.24SMT. Per these findings, pt's treatment parameters are as follows: A/P -- 11 cm, SOA -- 30degrees, Coil Angle -- 5 degrees, Motor Threshold -- 1.24 SMT. With these parameters, the pt will receive 36 sessions of TMS according to the following protocol: 3000 pulses per session, with stimulation in bursts of pulses lasting 4 seconds at a frequency of 10 Hz, separated by 26 seconds of rest. After determining pt's tx parameters, coil was moved to the treatment location, and the first burst of pulses was applied at a reduced power of 80%MT. Pt reported no complaints, and stated that the stimulation was tolerable, so the first tx session was given to the pt. Stimulation power was gradually titrated up from 80%MT to 120%MT. Pt tolerated tx well. Pt and husband left with no complaints post-tx. Pt departed from clinic without issue. There was no severity in her tremors after treatment session.

## 2016-04-27 ENCOUNTER — Other Ambulatory Visit (INDEPENDENT_AMBULATORY_CARE_PROVIDER_SITE_OTHER): Payer: Medicare Other | Admitting: Emergency Medicine

## 2016-04-27 DIAGNOSIS — F332 Major depressive disorder, recurrent severe without psychotic features: Secondary | ICD-10-CM | POA: Diagnosis not present

## 2016-04-27 NOTE — Progress Notes (Signed)
Pt reported to [facility] for Repetitive Transcranial Magnetic Stimulation treatment for Major Depressive Disorder. Pt presented with pleasant affect. Pt reported no change in alcohol/substance use, caffeine consumption, sleep pattern. Pt with no complaints post-tx. Pt left clinic with her husband with no problems or issues.

## 2016-04-28 DIAGNOSIS — H01004 Unspecified blepharitis left upper eyelid: Secondary | ICD-10-CM | POA: Diagnosis not present

## 2016-04-28 DIAGNOSIS — B353 Tinea pedis: Secondary | ICD-10-CM | POA: Diagnosis not present

## 2016-05-01 ENCOUNTER — Other Ambulatory Visit (INDEPENDENT_AMBULATORY_CARE_PROVIDER_SITE_OTHER): Payer: Medicare Other

## 2016-05-01 DIAGNOSIS — F332 Major depressive disorder, recurrent severe without psychotic features: Secondary | ICD-10-CM | POA: Diagnosis not present

## 2016-05-01 NOTE — Progress Notes (Signed)
Patient ID: Tracy Malone, female   DOB: 1931/08/23, 82 y.o.   MRN: [id number] Pt reported to [facility] for Repetitive Transcranial Magnetic Stimulation treatment for Major Depressive Disorder. Pt presented with pleasant affect. Pt reported no change in alcohol/substance use, caffeine consumption, sleep pattern. Pt stated that her eye has been watering and she is feels a sensation feeling after tx. Informed that those are normal side effects and she may experience the sensation until she is more adjusted. Pt with no complaints post-tx. Pt left clinic with her husband with no problems or issues.

## 2016-05-02 ENCOUNTER — Other Ambulatory Visit (INDEPENDENT_AMBULATORY_CARE_PROVIDER_SITE_OTHER): Payer: Medicare Other

## 2016-05-02 DIAGNOSIS — F332 Major depressive disorder, recurrent severe without psychotic features: Secondary | ICD-10-CM

## 2016-05-02 NOTE — Progress Notes (Signed)
Pt reported to [facility] for Repetitive Transcranial Magnetic Stimulation treatment for Major Depressive Disorder. Pt presented with pleasant affect. Pt reported no change in alcohol/substance use, caffeine consumption, sleep pattern. Pt requests a step stool because she has a hard time getting on the chair. Beather Arbour was notified. Pt with no complaints post-tx. Pt left clinic with her husband with no problems or issues.

## 2016-05-03 ENCOUNTER — Ambulatory Visit (INDEPENDENT_AMBULATORY_CARE_PROVIDER_SITE_OTHER): Payer: Medicare Other | Admitting: Licensed Clinical Social Worker

## 2016-05-03 ENCOUNTER — Encounter: Payer: Medicare Other | Admitting: Family Medicine

## 2016-05-03 ENCOUNTER — Encounter (HOSPITAL_COMMUNITY): Payer: Self-pay

## 2016-05-03 DIAGNOSIS — I8001 Phlebitis and thrombophlebitis of superficial vessels of right lower extremity: Secondary | ICD-10-CM | POA: Diagnosis not present

## 2016-05-03 DIAGNOSIS — F3341 Major depressive disorder, recurrent, in partial remission: Secondary | ICD-10-CM

## 2016-05-03 DIAGNOSIS — M79604 Pain in right leg: Secondary | ICD-10-CM | POA: Diagnosis not present

## 2016-05-04 ENCOUNTER — Ambulatory Visit: Payer: Medicare Other | Admitting: Podiatry

## 2016-05-04 ENCOUNTER — Other Ambulatory Visit (INDEPENDENT_AMBULATORY_CARE_PROVIDER_SITE_OTHER): Payer: Medicare Other

## 2016-05-04 DIAGNOSIS — F332 Major depressive disorder, recurrent severe without psychotic features: Secondary | ICD-10-CM

## 2016-05-04 NOTE — Progress Notes (Signed)
Patient ID: Tracy Malone, female   DOB: [birth year], 82 y.o.   MRN: [id number] Pt reported to [facility] for Repetitive Transcranial Magnetic Stimulation treatment for Major Depressive Disorder. Pt presented with pleasant affect. Pt reported no change in alcohol/substance use, caffeine consumption, sleep pattern.Pt with no complaints post-tx. Pt left clinic with her husband with no problems or issues.

## 2016-05-05 ENCOUNTER — Other Ambulatory Visit (INDEPENDENT_AMBULATORY_CARE_PROVIDER_SITE_OTHER): Payer: Medicare Other

## 2016-05-05 DIAGNOSIS — F332 Major depressive disorder, recurrent severe without psychotic features: Secondary | ICD-10-CM

## 2016-05-05 NOTE — Progress Notes (Signed)
Patient ID: Tracy Malone, female   DOB: 1932-07-13, 82 y.o.   MRN: [id number] Pt reported to [facility] for Repetitive Transcranial Magnetic Stimulation treatment for Major Depressive Disorder. Pt presented with pleasant affect. Pt reported no change in alcohol/substance use, caffeine consumption, sleep pattern.Pt with no complaints post-tx. Pt left clinic with her husband with no problems or issues.

## 2016-05-08 ENCOUNTER — Other Ambulatory Visit (INDEPENDENT_AMBULATORY_CARE_PROVIDER_SITE_OTHER): Payer: Medicare Other

## 2016-05-08 DIAGNOSIS — F332 Major depressive disorder, recurrent severe without psychotic features: Secondary | ICD-10-CM | POA: Diagnosis not present

## 2016-05-08 NOTE — Progress Notes (Signed)
Pt reported to [facility] for Repetitive Transcranial Magnetic Stimulation treatment for Major Depressive Disorder. Pt presented with pleasant affect. Pt reported no change in alcohol/substance use, caffeine consumption, sleep pattern.Pt with no complaints post-tx. Pt left clinic with her husband with no problems or issues.

## 2016-05-09 ENCOUNTER — Encounter: Payer: Self-pay | Admitting: Vascular Surgery

## 2016-05-09 ENCOUNTER — Other Ambulatory Visit: Payer: Self-pay | Admitting: *Deleted

## 2016-05-09 ENCOUNTER — Ambulatory Visit (INDEPENDENT_AMBULATORY_CARE_PROVIDER_SITE_OTHER): Payer: Medicare Other | Admitting: Podiatry

## 2016-05-09 ENCOUNTER — Encounter: Payer: Self-pay | Admitting: Podiatry

## 2016-05-09 ENCOUNTER — Other Ambulatory Visit (INDEPENDENT_AMBULATORY_CARE_PROVIDER_SITE_OTHER): Payer: Medicare Other

## 2016-05-09 DIAGNOSIS — B353 Tinea pedis: Secondary | ICD-10-CM

## 2016-05-09 DIAGNOSIS — F332 Major depressive disorder, recurrent severe without psychotic features: Secondary | ICD-10-CM

## 2016-05-09 DIAGNOSIS — Q828 Other specified congenital malformations of skin: Secondary | ICD-10-CM | POA: Diagnosis not present

## 2016-05-09 NOTE — Progress Notes (Signed)
   Subjective:    Patient ID: Tracy Malone, female    DOB: [birth year], 82 y.o.   MRN: [id number]  HPI: She presents today with a chief complaint of pain to the plantar forefoot bilaterally. She states that left forefoot is just so tender bilaterally and they have these callused areas that have been present for the past several years. She states that she tries to trim them occasionally but she is concerned about the skin healing on the bottom of her foot. She states that the sides of her feet and in between her toes are also peeling and she solid dermatologist who recommended a $300 medication. She states that she was unable to purchase that I did try Lotrimin over-the-counter seems to be working better.    Review of Systems  Constitutional: Positive for activity change.  HENT: Positive for tinnitus.   Musculoskeletal: Positive for arthralgias, back pain, gait problem and myalgias.  Neurological: Positive for tremors and weakness.  Psychiatric/Behavioral: The patient is nervous/anxious.   All other systems reviewed and are negative.      Objective:   Physical Exam: Vital signs are stable she is alert and oriented 3. Pulses are strongly palpable. Neurologic sensorium is intact. Deep tendon reflexes are intact. Muscle strength is normal and symmetrical bilateral. Orthopedic evaluation demonstrates rectus foot bilateral. Hammertoe deformities are noted bilateral lower hallux valgus deformities are noted bilateral. Cutaneous evaluation demonstrates interdigital tinea pedis. She also has multiple reactive hyperkeratotic lesions or keratotic lesions plantar aspect of bilateral foot.        Assessment & Plan:  Porokeratosis and tinea pedis bilateral.  Plan: Debrided all reactive hyperkeratoses today and placed her in silicone metatarsal pads. I also recommended she continue with the Lotrimin and follow-up with her dermatologist.

## 2016-05-09 NOTE — Progress Notes (Signed)
Pt reported to [facility] for Repetitive Transcranial Magnetic Stimulation treatment for Major Depressive Disorder. Pt presented with pleasant affect. Pt reported no change in alcohol/substance use, caffeine consumption, sleep pattern.Pt with no complaints post-tx. Pt had to use the restroom during a minute into the session. Pt left clinic with her husband with no problems or issues.

## 2016-05-10 ENCOUNTER — Other Ambulatory Visit (INDEPENDENT_AMBULATORY_CARE_PROVIDER_SITE_OTHER): Payer: Medicare Other

## 2016-05-10 DIAGNOSIS — F332 Major depressive disorder, recurrent severe without psychotic features: Secondary | ICD-10-CM | POA: Diagnosis not present

## 2016-05-10 NOTE — Progress Notes (Signed)
Patient ID: Tracy Malone, female   DOB: 1932/07/20, 82 y.o.   MRN: [id number] Pt reported to [facility] for Repetitive Transcranial Magnetic Stimulation treatment for Major Depressive Disorder. Pt presented with pleasant affect. Pt reported no change in alcohol/substance use, caffeine consumption, sleep pattern.Pt with no complaints post-tx. Pt had to use the restroom during a minute into the session. Pt left clinic with her husband with no problems or issues.

## 2016-05-11 ENCOUNTER — Other Ambulatory Visit (INDEPENDENT_AMBULATORY_CARE_PROVIDER_SITE_OTHER): Payer: Medicare Other

## 2016-05-11 DIAGNOSIS — F332 Major depressive disorder, recurrent severe without psychotic features: Secondary | ICD-10-CM

## 2016-05-11 NOTE — Progress Notes (Signed)
Patient ID: Tracy Malone, female   DOB: February 04, 1932, 82 y.o.   MRN: [id number] Pt reported to [facility] for Repetitive Transcranial Magnetic Stimulation treatment for Major Depressive Disorder. Pt presented with pleasant affect. Pt reported no change in alcohol/substance use, caffeine consumption, sleep pattern.Pt with no complaints post-tx. Pt had to use the restroom during a minute into the session.Pt left clinic with her husband with no problems or issues.

## 2016-05-12 ENCOUNTER — Other Ambulatory Visit (INDEPENDENT_AMBULATORY_CARE_PROVIDER_SITE_OTHER): Payer: Medicare Other

## 2016-05-12 DIAGNOSIS — Z23 Encounter for immunization: Secondary | ICD-10-CM | POA: Diagnosis not present

## 2016-05-12 DIAGNOSIS — F332 Major depressive disorder, recurrent severe without psychotic features: Secondary | ICD-10-CM | POA: Diagnosis not present

## 2016-05-12 NOTE — Progress Notes (Signed)
Patient ID: Tracy Malone, female   DOB: July 12, 1932, 82 y.o.   MRN: [id number] Pt reported to [facility] for Repetitive Transcranial Magnetic Stimulation treatment for Major Depressive Disorder. Pt presented with pleasant affect. Pt reported no change in alcohol/substance use, caffeine consumption, sleep pattern.Pt with no complaints post-tx. Pt left clinic with her husband with no problems or issues.

## 2016-05-15 ENCOUNTER — Other Ambulatory Visit (INDEPENDENT_AMBULATORY_CARE_PROVIDER_SITE_OTHER): Payer: Medicare Other

## 2016-05-15 DIAGNOSIS — F332 Major depressive disorder, recurrent severe without psychotic features: Secondary | ICD-10-CM

## 2016-05-15 NOTE — Progress Notes (Signed)
Patient ID: Tracy Malone, female   DOB: Mar 26, 1932, 82 y.o.   MRN: [id number] Pt reported to [facility] for Repetitive Transcranial Magnetic Stimulation treatment for Major Depressive Disorder. Pt presented with pleasant affect. Pt reported no change in alcohol/substance use, caffeine consumption, sleep pattern.Pt watched television during tx. Pt stated that she is not feeling as tired after sessions as she was in the beginning. Pt with no complaints post-tx. Pt left clinic with her husband with no problems or issues.

## 2016-05-16 ENCOUNTER — Other Ambulatory Visit (INDEPENDENT_AMBULATORY_CARE_PROVIDER_SITE_OTHER): Payer: Medicare Other

## 2016-05-16 ENCOUNTER — Ambulatory Visit: Payer: Medicare Other | Admitting: Podiatry

## 2016-05-16 DIAGNOSIS — F332 Major depressive disorder, recurrent severe without psychotic features: Secondary | ICD-10-CM

## 2016-05-16 NOTE — Progress Notes (Signed)
Pt reported to [facility] for Repetitive Transcranial Magnetic Stimulation treatment for Major Depressive Disorder. Pt presented with pleasant affect. Pt reported no change in alcohol/substance use, caffeine consumption, sleep pattern. Pt watched television during tx. Pt with no complaints post-tx. Pt left clinic with her husband with no problems or issues 

## 2016-05-17 ENCOUNTER — Other Ambulatory Visit (INDEPENDENT_AMBULATORY_CARE_PROVIDER_SITE_OTHER): Payer: Medicare Other

## 2016-05-17 DIAGNOSIS — F332 Major depressive disorder, recurrent severe without psychotic features: Secondary | ICD-10-CM | POA: Diagnosis not present

## 2016-05-17 NOTE — Progress Notes (Signed)
Patient ID: Tracy Malone, female   DOB: 09/20/1931, 82 y.o.   MRN: [id number] Pt reported to [facility] for Repetitive Transcranial Magnetic Stimulation treatment for Major Depressive Disorder. Pt presented with pleasant affect. Pt reported no change in alcohol/substance use, caffeine consumption, sleep pattern.Pt watched television during tx. Pt completed PHQ-9, totaling 1. Pt with no complaints post-tx. Pt left clinic with her husband with no problems or issues.

## 2016-05-18 ENCOUNTER — Other Ambulatory Visit (INDEPENDENT_AMBULATORY_CARE_PROVIDER_SITE_OTHER): Payer: Medicare Other | Admitting: Emergency Medicine

## 2016-05-18 DIAGNOSIS — F332 Major depressive disorder, recurrent severe without psychotic features: Secondary | ICD-10-CM

## 2016-05-18 NOTE — Progress Notes (Signed)
Pt reported to [facility] for Repetitive Transcranial Magnetic Stimulation treatment for Major Depressive Disorder. Pt presented with pleasant affect. Pt reported no change in alcohol/substance use, caffeine consumption, sleep pattern. Pt watched television during tx. Pt with no complaints post-tx. Pt left clinic with her husband with no problems or issues 

## 2016-05-19 ENCOUNTER — Other Ambulatory Visit (INDEPENDENT_AMBULATORY_CARE_PROVIDER_SITE_OTHER): Payer: Medicare Other | Admitting: Emergency Medicine

## 2016-05-19 DIAGNOSIS — F332 Major depressive disorder, recurrent severe without psychotic features: Secondary | ICD-10-CM

## 2016-05-19 NOTE — Progress Notes (Signed)
Pt reported to [facility] for Repetitive Transcranial Magnetic Stimulation treatment for Major Depressive Disorder. Pt presented with pleasant affect. Pt reported no change in alcohol/substance use, caffeine consumption, sleep pattern. Pt watched television during tx. Pt with no complaints post-tx. Pt left clinic with her husband with no problems or issues 

## 2016-05-22 ENCOUNTER — Ambulatory Visit (INDEPENDENT_AMBULATORY_CARE_PROVIDER_SITE_OTHER): Payer: Medicare Other | Admitting: Gastroenterology

## 2016-05-22 ENCOUNTER — Encounter: Payer: Self-pay | Admitting: Gastroenterology

## 2016-05-22 ENCOUNTER — Other Ambulatory Visit (INDEPENDENT_AMBULATORY_CARE_PROVIDER_SITE_OTHER): Payer: Medicare Other

## 2016-05-22 VITALS — BP 118/68 | HR 78 | Ht 59.0 in | Wt 113.0 lb

## 2016-05-22 DIAGNOSIS — F332 Major depressive disorder, recurrent severe without psychotic features: Secondary | ICD-10-CM | POA: Diagnosis not present

## 2016-05-22 DIAGNOSIS — K30 Functional dyspepsia: Secondary | ICD-10-CM

## 2016-05-22 DIAGNOSIS — K589 Irritable bowel syndrome without diarrhea: Secondary | ICD-10-CM | POA: Diagnosis not present

## 2016-05-22 DIAGNOSIS — R1906 Epigastric swelling, mass or lump: Secondary | ICD-10-CM

## 2016-05-22 NOTE — Progress Notes (Signed)
Review of previous GI testing; Dr. Benson Norway and changed to Dr. Ardis Hughs 10/2014:  -CBC, CMP, and TSH were normal in 07/2014 and again in 08/2014. CT scan02/26/2016. This was done without contrast due to dye allergy. This showed no acute findings in the abdomen and pelvis specifically to explain her history of abdominal pain. There is a 3.0 cm left ovarian cystic lesion that is likely benign. As I stated above, she is following with gynecology and they're going to monitor that, but they think that it is benign. Stomach, duodenum, small bowel, and colon grossly unremarkable.Abdominal ultrasound on 09/04/2014 showed no acute or chronic abnormalities of the hepatobiliary tree; evaluation of the pancreatic tail was limited. There is no acute abnormalities of the kidney demonstrated. A 1.6 cm diameter mid to lower pole cyst was noted in the right kidney.  August 2015 HIDA scan with CCK, which was normal. This was performed for complaints of upper abdominal pain, appetite loss, fatigue, and weight loss at that time as well.EGD was in December 2013 at which time she was found to have moderate gastritis. Biopsies actually showed H. Pylori with chronic active gastritis, however, after taking 1 dose of the Pylera medication she discontinued it because of side effects (it made her feel horrible). Therefore, she has not completed treatment for H. Pylori. Duodenal biopsies were unremarkable at that time. flexible sigmoidoscopy also in December 2013 that was normal with only medium hemorrhoids noted. This was performed for diarrhea and biopsies of the descending and sigmoid colon were unremarkable. She states that she had a full colonoscopy prior to that with Dr. Benson Norway, however, after calling his office they stated they have never performed a full colonoscopy on her. Gastric emptying scan in January 2014 showed 33% of the ingested activity remaining in the stomach which is mildly abnormal compared to the normal range of less than 30%  of the ingested activity remaining at 120 minutes. Repeat GES 09/2014 was normal.  EGD 11/2014 with Dr. Ardis Hughs showed pan-gastritis with positive Hpylori on biopsies; also small hiatal hernia.  Saw ID and was apparently treated with subsequent testing negative.  Repeat abdominal ultrasound 09/2015 was normal.   HPI: This is a  very pleasant 82 year old woman whom I last saw about a year and a half ago. She has been back in the office a couple times since then seeing some of our extenders. She continues to have the same complaint     Chief complaint is left upper quadrant bloating  She has LUQ discomfot.  A band of swelling across the top of her abdomen.  Was having weight loss that has reversed and now back to her baseline.  The still has the swelling in left upper quadrant.  She is under a lot of stress.  Getting Magnetic therapy to brain for depression; this has been helping her a 'great deal'  This occurs once to twice per week.  Can skip a week or two at a time.  No nausea or vomiting.  Has been intermittently constipated (from h2 blocker she says).  Weight is unchanged since 7 months ago (same scale her in GI)  ROS: complete GI ROS as described in HPI.  Constitutional:  No unintentional weight loss   Past Medical History:  Diagnosis Date  . Bell's palsy 1966   ? right side facial droop  . Carotid artery disease ([place]) 2010   on vascular screening;unchanged 2013.(could not tolerate simvastatin, no other statins tried)--<30% blockage bilat 07/2011  . Chronic abdominal pain   .  Chronic fatigue and malaise   . Claustrophobia   . Depression    treated in the past for years;stopped in 2010 for a years  . Duodenal ulcer 1962   h/o  . Fibromyalgia   . 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 GERD (gastroesophageal reflux disease)   . Glaucoma, narrow-angle    s/p laser surgery  . Hyperthyroidism    h/o x 2 years  . Hypothyroid 9/08  .  IBS (irritable bowel syndrome)    Dr. Benson Norway  . Ocular migraine   . Osteoporosis 10/11   Dr.Hawkes  . Panic attack   . Recurrent UTI    has cystocele-Dr.Grewal  . Shingles 1999   h/o  . SLE (systemic lupus erythematosus) (HCC)    with arthralgia/myalgia, positive ANA centromere pattern, positive DNA, positive RNP, indeterminant anti-cardiolipin antibody-Dr.Hawkes.  . Superficial thrombophlebitis 03/2009   RLE  . Trochanteric bursitis 12/2008   bilateral    Past Surgical History:  Procedure Laterality Date  . ABDOMINAL HYSTERECTOMY    . CATARACT EXTRACTION, BILATERAL  1995, 1996  . Flexible sigmoidoscopy    . THYROIDECTOMY, PARTIAL  09/2005   L nodule; Dr. Harlow Asa  . TONSILLECTOMY  1946  . UPPER GI ENDOSCOPY  06/27/12  . VAGINAL HYSTERECTOMY  1971   and bladder repair.  Still has ovaries    Current Outpatient Prescriptions  Medication Sig Dispense Refill  . acetaminophen (TYLENOL) 650 MG CR tablet Take 650 mg by mouth as needed for pain. Reported on 09/06/2015    . ALPRAZolam (XANAX) 0.5 MG tablet Take 0.25 mg by mouth 2 (two) times daily as needed.     . ARTIFICIAL TEAR OP Apply 1-2 drops to eye 4 (four) times daily as needed (dry eyes).     Marland Kitchen b complex vitamins capsule Take 1 capsule by mouth as needed.     . Cholecalciferol (VITAMIN D) 2000 UNITS tablet Take 2,000 Units by mouth daily.    . Flaxseed, Linseed, (FLAX SEEDS PO) Take by mouth.    Marland Kitchen FLUoxetine (PROZAC) 10 MG tablet Take 10 mg by mouth daily.    Marland Kitchen levothyroxine (SYNTHROID) 25 MCG tablet Take 50 mcg by mouth daily before breakfast.     . Loperamide HCl (IMODIUM PO) Take 1 tablet by mouth as needed. Reported on 09/06/2015    . omeprazole (PRILOSEC) 20 MG capsule Take 1 capsule (20 mg total) by mouth every morning. (Patient taking differently: Take 20 mg by mouth daily as needed. ) 30 capsule 3  . vitamin E 400 UNIT capsule Take 400 Units by mouth every Monday, Wednesday, and Friday. Reported on 09/06/2015    . AMBULATORY  NON FORMULARY MEDICATION IBgard Take 1 capsule by mouth twice a day (Patient not taking: Reported on 05/22/2016) 12 capsule 0   No current facility-administered medications for this visit.     Allergies as of 05/22/2016 - Review Complete 05/22/2016  Allergen Reaction Noted  . Iodine Anaphylaxis 05/13/2012  . Levsin [hyoscyamine sulfate]  10/13/2015  . Salmon [fish allergy] Hives and Shortness Of Breath 05/13/2012  . Shellfish allergy Anaphylaxis 05/13/2012  . Aspirin Other (See Comments) 05/13/2012  . Ciprofloxacin Diarrhea 05/13/2012  . Codeine Nausea And Vomiting 05/13/2012  . Darvocet [propoxyphene n-acetaminophen] Nausea And Vomiting 05/13/2012  . Demerol [meperidine] Nausea Only 05/13/2012  . Dexilant [dexlansoprazole] Swelling 03/01/2016  . Diphedryl [diphenhydramine] Other (See Comments) 05/13/2012  . Doxycycline hyclate Other (See Comments) 05/13/2012  . Epinephrine  Other (See Comments) 05/13/2012  . Erythromycin Other (See Comments) 05/13/2012  . Flexeril [cyclobenzaprine] Other (See Comments) 05/13/2012  . Keflex [cephalexin] Hives 05/13/2012  . Latex Other (See Comments) 05/13/2012  . Prednisone Other (See Comments) 05/13/2012  . Pylera [bis subcit-metronid-tetracyc] Swelling 08/01/2012  . Sulfa antibiotics Other (See Comments) 05/13/2012  . Xylocaine [lidocaine hcl]  05/13/2012  . Zoloft [sertraline hcl] Swelling and Other (See Comments) 05/13/2012  . Advil [ibuprofen] Other (See Comments) 05/13/2012  . Clarithromycin Rash 01/14/2015  . Penicillins Hives and Rash 05/13/2012    Family History  Problem Relation Age of Onset  . Heart disease Mother   . Hypertension Mother   . Hypertension Sister   . Heart disease Brother   . Lung cancer Brother     lung  . Diabetes Maternal Grandfather     Social History   Social History  . Marital status: Married    Spouse name: N/A  . Number of children: 2  . Years of education: N/A   Occupational History  . retired  (school system)    Social History Main Topics  . Smoking status: Never Smoker  . Smokeless tobacco: Never Used  . Alcohol use No  . Drug use: No  . Sexual activity: No   Other Topics Concern  . Not on file   Social History Narrative   Married.  Son lives in [place]; Daughter [name] lives in [place]; 2 grandchildren     Physical Exam: BP 118/68   Pulse 78   Ht 4\' 11"  (1.499 m)   Wt 113 lb (51.3 kg)   BMI 22.82 kg/m  Constitutional: generally well-appearing Psychiatric: alert and oriented x3 Abdomen: soft, nontender, nondistended, no obvious ascites, no peritoneal signs, normal bowel sounds No peripheral edema noted in lower extremities  Assessment and plan: 82 y.o. female with chronic intermittent left upper quadrant bloating, swelling  I think she was really here for reassurance today that we are not missing anything serious like a cancer and I hope I was able to provide that for her. She has had CAT scan, ultrasounds, upper endoscopy, colonoscopy none of these show anything significant going on such as cancer ulcer. She does have intermittent bloating I think it is probably functional dyspepsia recommended she try Gas-X on as-needed basis and I recommended against any further dedicated GI testing.   Owens Loffler, MD [facility] 05/22/2016, 3:34 PM

## 2016-05-22 NOTE — Progress Notes (Signed)
Patient ID: Tracy Malone, female   DOB: March 23, 1932, 82 y.o.   MRN: [id number] Pt reported to [facility] for Repetitive Transcranial Magnetic Stimulation treatment for Major Depressive Disorder. Pt presented with pleasant affect. Pt reported no change in alcohol/substance use, caffeine consumption, sleep pattern.Pt watched television during tx. Pt stated that she has not been feeling as tired as she was at the start of [place] tx. Stated that she has been feeling better in regards to her depression. Pt with no complaints post-tx. Pt left clinic with her husband with no problems or issues.

## 2016-05-22 NOTE — Patient Instructions (Signed)
Take gas-ex when need for bloating, LUQ swelling.

## 2016-05-23 ENCOUNTER — Other Ambulatory Visit (INDEPENDENT_AMBULATORY_CARE_PROVIDER_SITE_OTHER): Payer: Medicare Other

## 2016-05-23 DIAGNOSIS — F332 Major depressive disorder, recurrent severe without psychotic features: Secondary | ICD-10-CM | POA: Diagnosis not present

## 2016-05-23 DIAGNOSIS — E039 Hypothyroidism, unspecified: Secondary | ICD-10-CM | POA: Diagnosis not present

## 2016-05-23 NOTE — Progress Notes (Signed)
Pt reported to [facility] for Repetitive Transcranial Magnetic Stimulation treatment for Major Depressive Disorder. Pt presented with pleasant affect. Pt reported no change in alcohol/substance use, caffeine consumption, sleep pattern. Pt watched television during tx. Pt with no complaints post-tx. Pt left clinic with her husband with no problems or issues 

## 2016-05-24 ENCOUNTER — Encounter (HOSPITAL_COMMUNITY): Payer: Self-pay

## 2016-05-24 ENCOUNTER — Ambulatory Visit: Payer: Medicare Other | Admitting: Licensed Clinical Social Worker

## 2016-05-25 ENCOUNTER — Other Ambulatory Visit (HOSPITAL_COMMUNITY): Payer: Medicare Other | Attending: Psychiatry

## 2016-05-25 DIAGNOSIS — F332 Major depressive disorder, recurrent severe without psychotic features: Secondary | ICD-10-CM | POA: Insufficient documentation

## 2016-05-25 NOTE — Progress Notes (Signed)
Patient ID: Tracy Malone, female   DOB: [birth year], 82 y.o.   MRN: [id number] Pt reported to [facility] for Repetitive Transcranial Magnetic Stimulation treatment for Major Depressive Disorder. Pt presented with pleasant affect. Pt reported no change in alcohol/substance use, caffeine consumption, sleep pattern.Pt watched television during tx. Pt stated that she was not feeling well but still feels as though [place] is working. Stated that she has been feeling better in regards to her depression. Pt with no complaints post-tx. Pt left clinic with her husband with no problems or issues.

## 2016-05-26 ENCOUNTER — Other Ambulatory Visit (INDEPENDENT_AMBULATORY_CARE_PROVIDER_SITE_OTHER): Payer: Medicare Other

## 2016-05-26 DIAGNOSIS — E063 Autoimmune thyroiditis: Secondary | ICD-10-CM | POA: Diagnosis not present

## 2016-05-26 DIAGNOSIS — F332 Major depressive disorder, recurrent severe without psychotic features: Secondary | ICD-10-CM | POA: Diagnosis not present

## 2016-05-26 DIAGNOSIS — E039 Hypothyroidism, unspecified: Secondary | ICD-10-CM | POA: Diagnosis not present

## 2016-05-26 DIAGNOSIS — Z98891 History of uterine scar from previous surgery: Secondary | ICD-10-CM | POA: Diagnosis not present

## 2016-05-26 NOTE — Progress Notes (Signed)
Pt reported to [facility] for Repetitive Transcranial Magnetic Stimulation treatment for Major Depressive Disorder. Pt presented with pleasant affect. Pt reported no change in alcohol/substance use, caffeine consumption, sleep pattern.Pt watched television during tx. Pt stated that she was not feeling well but still feels as though [place] is working. Stated that she has been feeling better in regards to her depression. Pt with no complaints post-tx. Pt left clinic with her husband with no problems or issues.

## 2016-05-29 ENCOUNTER — Encounter (HOSPITAL_COMMUNITY): Payer: Self-pay

## 2016-05-30 ENCOUNTER — Encounter (HOSPITAL_COMMUNITY): Payer: Self-pay

## 2016-05-30 ENCOUNTER — Other Ambulatory Visit (INDEPENDENT_AMBULATORY_CARE_PROVIDER_SITE_OTHER): Payer: Medicare Other

## 2016-05-30 DIAGNOSIS — F332 Major depressive disorder, recurrent severe without psychotic features: Secondary | ICD-10-CM

## 2016-05-30 NOTE — Progress Notes (Signed)
Pt reported to [facility] for Repetitive Transcranial Magnetic Stimulation treatment for Major Depressive Disorder. Pt presented with pleasant affect. Pt reported no change in alcohol/substance use, caffeine consumption, sleep pattern.Pt stated she still did not feel well. Pt with no complaints post-tx. Pt left clinic with her husband with no problems or issues.

## 2016-05-31 ENCOUNTER — Other Ambulatory Visit (HOSPITAL_COMMUNITY): Payer: Medicare Other

## 2016-06-01 ENCOUNTER — Encounter (HOSPITAL_COMMUNITY): Payer: Self-pay

## 2016-06-02 ENCOUNTER — Encounter (HOSPITAL_COMMUNITY): Payer: Self-pay | Admitting: Emergency Medicine

## 2016-06-05 ENCOUNTER — Other Ambulatory Visit (INDEPENDENT_AMBULATORY_CARE_PROVIDER_SITE_OTHER): Payer: Medicare Other

## 2016-06-05 DIAGNOSIS — F332 Major depressive disorder, recurrent severe without psychotic features: Secondary | ICD-10-CM | POA: Diagnosis not present

## 2016-06-05 NOTE — Progress Notes (Signed)
Patient ID: HIILEI NED, female   DOB: 1932-01-11, 82 y.o.   MRN: [id number] Pt reported to [facility] for Repetitive Transcranial Magnetic Stimulation treatment for Major Depressive Disorder. Pt presented with pleasant affect. Pt reported no change in alcohol/substance use, caffeine consumption, sleep pattern.Pt watched television during tx. Pt with no complaints post-tx. Pt left clinic with her husband with no problems or issues.

## 2016-06-06 ENCOUNTER — Other Ambulatory Visit (INDEPENDENT_AMBULATORY_CARE_PROVIDER_SITE_OTHER): Payer: Medicare Other

## 2016-06-06 DIAGNOSIS — F332 Major depressive disorder, recurrent severe without psychotic features: Secondary | ICD-10-CM

## 2016-06-06 DIAGNOSIS — H40012 Open angle with borderline findings, low risk, left eye: Secondary | ICD-10-CM | POA: Diagnosis not present

## 2016-06-06 DIAGNOSIS — H40011 Open angle with borderline findings, low risk, right eye: Secondary | ICD-10-CM | POA: Diagnosis not present

## 2016-06-06 DIAGNOSIS — H52203 Unspecified astigmatism, bilateral: Secondary | ICD-10-CM | POA: Diagnosis not present

## 2016-06-06 DIAGNOSIS — H04123 Dry eye syndrome of bilateral lacrimal glands: Secondary | ICD-10-CM | POA: Diagnosis not present

## 2016-06-06 NOTE — Progress Notes (Signed)
Pt reported to [facility] for Repetitive Transcranial Magnetic Stimulation treatment for Major Depressive Disorder. Pt presented with pleasant affect. Pt reported no change in alcohol/substance use, caffeine consumption, sleep pattern. Pt watched television during tx. Pt with no complaints post-tx. Pt left clinic with her husband with no problems or issues 

## 2016-06-07 ENCOUNTER — Other Ambulatory Visit (INDEPENDENT_AMBULATORY_CARE_PROVIDER_SITE_OTHER): Payer: Medicare Other | Admitting: Emergency Medicine

## 2016-06-07 DIAGNOSIS — F332 Major depressive disorder, recurrent severe without psychotic features: Secondary | ICD-10-CM | POA: Diagnosis not present

## 2016-06-07 NOTE — Progress Notes (Addendum)
Pt reported to [facility] for Repetitive Transcranial Magnetic Stimulation treatment for Major Depressive Disorder. Pt presented with pleasant affect. Pt reported no change in alcohol/substance use, caffeine consumption, sleep pattern.Pt watched television during tx. Pt completed PHQ-9 and scored a 0. Pt stated that [place] has been a positive experience thus far. Pt with no complaints post-tx. Pt left clinic with her husband with no problems or issues

## 2016-06-08 ENCOUNTER — Other Ambulatory Visit (INDEPENDENT_AMBULATORY_CARE_PROVIDER_SITE_OTHER): Payer: Medicare Other

## 2016-06-08 DIAGNOSIS — F332 Major depressive disorder, recurrent severe without psychotic features: Secondary | ICD-10-CM | POA: Diagnosis not present

## 2016-06-08 NOTE — Progress Notes (Signed)
Patient ID: Tracy Malone, female   DOB: 04-07-1932, 82 y.o.   MRN: [id number] Pt reported to [facility] for Repetitive Transcranial Magnetic Stimulation treatment for Major Depressive Disorder. Pt presented with pleasant affect. Pt reported no change in alcohol/substance use, caffeine consumption, sleep pattern.Pt watched television and engaged in conversation during tx. Pt stated that she has been feeling much better since [place] and she is excited that she decided to proceed with the procedure. Pt with no complaints post-tx. Pt left clinic with her husband with no problems or issues.

## 2016-06-09 ENCOUNTER — Encounter (HOSPITAL_COMMUNITY): Payer: Self-pay

## 2016-06-09 DIAGNOSIS — F411 Generalized anxiety disorder: Secondary | ICD-10-CM | POA: Diagnosis not present

## 2016-06-12 ENCOUNTER — Other Ambulatory Visit (INDEPENDENT_AMBULATORY_CARE_PROVIDER_SITE_OTHER): Payer: Medicare Other

## 2016-06-12 DIAGNOSIS — F332 Major depressive disorder, recurrent severe without psychotic features: Secondary | ICD-10-CM | POA: Diagnosis not present

## 2016-06-12 NOTE — Progress Notes (Signed)
Patient ID: Tracy Malone, female   DOB: February 13, 1932, 82 y.o.   MRN: [id number] Pt reported to [facility] for Repetitive Transcranial Magnetic Stimulation treatment for Major Depressive Disorder. Pt presented with pleasant affect. Pt reported no change in alcohol/substance use, caffeine consumption, sleep pattern.Pt watched television and engaged in conversation during tx. Pt with no complaints post-tx. Pt left clinic with her husband with no problems or issues.

## 2016-06-13 ENCOUNTER — Other Ambulatory Visit (INDEPENDENT_AMBULATORY_CARE_PROVIDER_SITE_OTHER): Payer: Medicare Other

## 2016-06-13 DIAGNOSIS — F332 Major depressive disorder, recurrent severe without psychotic features: Secondary | ICD-10-CM | POA: Diagnosis not present

## 2016-06-13 DIAGNOSIS — J019 Acute sinusitis, unspecified: Secondary | ICD-10-CM | POA: Diagnosis not present

## 2016-06-13 NOTE — Progress Notes (Signed)
Pt reported to [facility] for Repetitive Transcranial Magnetic Stimulation treatment for Major Depressive Disorder. Pt presented with pleasant affect. Pt reported no change in alcohol/substance use, caffeine consumption, sleep pattern.Pt watched television and engaged in conversation during tx. Pt with no complaints post-tx. Pt left clinic with her husband with no problems or issues.

## 2016-06-14 ENCOUNTER — Other Ambulatory Visit (INDEPENDENT_AMBULATORY_CARE_PROVIDER_SITE_OTHER): Payer: Medicare Other

## 2016-06-14 DIAGNOSIS — F332 Major depressive disorder, recurrent severe without psychotic features: Secondary | ICD-10-CM | POA: Diagnosis not present

## 2016-06-14 NOTE — Progress Notes (Signed)
Pt reported to [facility] for Repetitive Transcranial Magnetic Stimulation treatment for Major Depressive Disorder. Pt presented with pleasant affect. Pt reported no change in alcohol/substance use, caffeine consumption, sleep pattern.Pt watched television and engaged in conversation during tx. Pt with no complaints post-tx. Pt left clinic with her husband with no problems or issues.

## 2016-06-15 ENCOUNTER — Encounter (HOSPITAL_COMMUNITY): Payer: Self-pay

## 2016-06-16 ENCOUNTER — Other Ambulatory Visit (INDEPENDENT_AMBULATORY_CARE_PROVIDER_SITE_OTHER): Payer: Medicare Other | Admitting: Emergency Medicine

## 2016-06-16 DIAGNOSIS — F332 Major depressive disorder, recurrent severe without psychotic features: Secondary | ICD-10-CM | POA: Diagnosis not present

## 2016-06-16 NOTE — Progress Notes (Signed)
Pt reported to [facility] for Repetitive Transcranial Magnetic Stimulation treatment for Major Depressive Disorder. Pt presented with pleasant affect. Pt reported no change in alcohol/substance use, caffeine consumption, sleep pattern.Pt watched television and engaged in conversation during tx. Pt with no complaints post-tx. Pt left clinic with her husband with no problems or issues.

## 2016-06-19 ENCOUNTER — Other Ambulatory Visit (INDEPENDENT_AMBULATORY_CARE_PROVIDER_SITE_OTHER): Payer: Medicare Other

## 2016-06-19 DIAGNOSIS — F332 Major depressive disorder, recurrent severe without psychotic features: Secondary | ICD-10-CM

## 2016-06-19 NOTE — Progress Notes (Signed)
Patient ID: Tracy Malone, female   DOB: January 23, 1932, 82 y.o.   MRN: [id number] Pt reported to [facility] for Repetitive Transcranial Magnetic Stimulation treatment for Major Depressive Disorder. Pt presented with pleasant affect. Pt reported no change in alcohol/substance use, caffeine consumption, sleep pattern.Pt watched television and engaged in conversation during tx. Pt with no complaints post-tx. Pt left clinic with her husband with no problems or issues.

## 2016-06-20 ENCOUNTER — Other Ambulatory Visit (INDEPENDENT_AMBULATORY_CARE_PROVIDER_SITE_OTHER): Payer: Medicare Other | Admitting: Emergency Medicine

## 2016-06-20 DIAGNOSIS — F332 Major depressive disorder, recurrent severe without psychotic features: Secondary | ICD-10-CM

## 2016-06-20 NOTE — Progress Notes (Signed)
Pt reported to [facility] for Repetitive Transcranial Magnetic Stimulation treatment for Major Depressive Disorder. Pt presented with pleasant affect. Pt reported no change in alcohol/substance use, caffeine consumption, sleep pattern.Pt watched television. Pt spoke to Probation officer. Pt with no complaints post-tx. Pt left clinic with her husband with no problems or issues.

## 2016-06-21 ENCOUNTER — Encounter (HOSPITAL_COMMUNITY): Payer: Self-pay

## 2016-06-22 DIAGNOSIS — R109 Unspecified abdominal pain: Secondary | ICD-10-CM | POA: Diagnosis not present

## 2016-06-22 DIAGNOSIS — N390 Urinary tract infection, site not specified: Secondary | ICD-10-CM | POA: Diagnosis not present

## 2016-06-23 DIAGNOSIS — G20 Parkinson's disease: Secondary | ICD-10-CM

## 2016-06-23 DIAGNOSIS — G20A1 Parkinson's disease without dyskinesia, without mention of fluctuations: Secondary | ICD-10-CM

## 2016-06-23 HISTORY — DX: Parkinson's disease: G20

## 2016-06-23 HISTORY — DX: Parkinson's disease without dyskinesia, without mention of fluctuations: G20.A1

## 2016-06-26 ENCOUNTER — Other Ambulatory Visit (HOSPITAL_COMMUNITY): Payer: Medicare Other | Attending: Psychiatry | Admitting: Emergency Medicine

## 2016-06-26 DIAGNOSIS — F332 Major depressive disorder, recurrent severe without psychotic features: Secondary | ICD-10-CM | POA: Diagnosis not present

## 2016-06-26 NOTE — Progress Notes (Signed)
Pt reported to [facility] for Repetitive Transcranial Magnetic Stimulation treatment for Major Depressive Disorder. Pt presented with pleasant affect. Pt reported no change in alcohol/substance use, caffeine consumption, sleep pattern.Pt watched television. Pt spoke to Probation officer. Pt with no complaints post-tx. Pt left clinic with her husband with no problems or issues.

## 2016-06-27 ENCOUNTER — Other Ambulatory Visit (INDEPENDENT_AMBULATORY_CARE_PROVIDER_SITE_OTHER): Payer: Medicare Other

## 2016-06-27 DIAGNOSIS — F332 Major depressive disorder, recurrent severe without psychotic features: Secondary | ICD-10-CM

## 2016-06-27 NOTE — Progress Notes (Signed)
Pt reported to [facility] for Repetitive Transcranial Magnetic Stimulation treatment for Major Depressive Disorder. Pt presented with pleasant affect. Pt reported no change in alcohol/substance use, caffeine consumption, sleep pattern.Pt watched television. Pt spoke to Probation officer.Pt with no complaints post-tx. Pt left clinic with her husband with no problems or issues.

## 2016-06-28 ENCOUNTER — Other Ambulatory Visit (INDEPENDENT_AMBULATORY_CARE_PROVIDER_SITE_OTHER): Payer: Medicare Other | Admitting: Emergency Medicine

## 2016-06-28 DIAGNOSIS — F332 Major depressive disorder, recurrent severe without psychotic features: Secondary | ICD-10-CM

## 2016-06-28 NOTE — Progress Notes (Signed)
Pt reported to [facility] for Repetitive Transcranial Magnetic Stimulation treatment for Major Depressive Disorder. Pt presented with pleasant affect. Pt reported no change in alcohol/substance use, caffeine consumption, sleep pattern.Pt had to stop treatment mid way because she was experiencing cramps. Pt used restroom and resumed treatment,Pt watched television. Pt spoke to Probation officer.Pt with no complaints post-tx. Pt left clinic with her husband with no problems or issues.

## 2016-06-30 ENCOUNTER — Other Ambulatory Visit: Payer: Self-pay

## 2016-07-03 ENCOUNTER — Other Ambulatory Visit (INDEPENDENT_AMBULATORY_CARE_PROVIDER_SITE_OTHER): Payer: Medicare Other

## 2016-07-03 DIAGNOSIS — F332 Major depressive disorder, recurrent severe without psychotic features: Secondary | ICD-10-CM

## 2016-07-03 NOTE — Progress Notes (Signed)
Patient ID: Tracy Malone, female   DOB: 1932-02-28, 82 y.o.   MRN: [id number] Pt reported to [facility] for Repetitive Transcranial Magnetic Stimulation treatment for Major Depressive Disorder. Pt presented with pleasant affect. Pt reported no change in alcohol/substance use, caffeine consumption, sleep pattern. Pt watched television during tx. Pt completed PHQ-9, totaling 0. Pt spoke to Probation officer.Pt with no complaints post-tx. Pt left clinic with her husband with no problems or issues.

## 2016-07-04 ENCOUNTER — Ambulatory Visit (INDEPENDENT_AMBULATORY_CARE_PROVIDER_SITE_OTHER): Payer: Medicare Other | Admitting: Licensed Clinical Social Worker

## 2016-07-04 DIAGNOSIS — F3341 Major depressive disorder, recurrent, in partial remission: Secondary | ICD-10-CM | POA: Diagnosis not present

## 2016-07-04 DIAGNOSIS — S81812A Laceration without foreign body, left lower leg, initial encounter: Secondary | ICD-10-CM | POA: Diagnosis not present

## 2016-07-06 DIAGNOSIS — R5383 Other fatigue: Secondary | ICD-10-CM | POA: Diagnosis not present

## 2016-07-06 DIAGNOSIS — R1032 Left lower quadrant pain: Secondary | ICD-10-CM | POA: Diagnosis not present

## 2016-07-06 DIAGNOSIS — N83209 Unspecified ovarian cyst, unspecified side: Secondary | ICD-10-CM | POA: Diagnosis not present

## 2016-07-11 DIAGNOSIS — M7061 Trochanteric bursitis, right hip: Secondary | ICD-10-CM | POA: Diagnosis not present

## 2016-07-11 DIAGNOSIS — M7062 Trochanteric bursitis, left hip: Secondary | ICD-10-CM | POA: Diagnosis not present

## 2016-07-19 DIAGNOSIS — M546 Pain in thoracic spine: Secondary | ICD-10-CM | POA: Diagnosis not present

## 2016-07-19 DIAGNOSIS — Z1389 Encounter for screening for other disorder: Secondary | ICD-10-CM | POA: Diagnosis not present

## 2016-07-19 DIAGNOSIS — F411 Generalized anxiety disorder: Secondary | ICD-10-CM | POA: Diagnosis not present

## 2016-07-19 DIAGNOSIS — Z0000 Encounter for general adult medical examination without abnormal findings: Secondary | ICD-10-CM | POA: Diagnosis not present

## 2016-07-19 DIAGNOSIS — K580 Irritable bowel syndrome with diarrhea: Secondary | ICD-10-CM | POA: Diagnosis not present

## 2016-07-19 DIAGNOSIS — G250 Essential tremor: Secondary | ICD-10-CM | POA: Diagnosis not present

## 2016-07-19 DIAGNOSIS — G8929 Other chronic pain: Secondary | ICD-10-CM | POA: Diagnosis not present

## 2016-07-19 DIAGNOSIS — F322 Major depressive disorder, single episode, severe without psychotic features: Secondary | ICD-10-CM | POA: Diagnosis not present

## 2016-07-26 ENCOUNTER — Ambulatory Visit: Payer: Medicare Other | Admitting: Licensed Clinical Social Worker

## 2016-07-27 DIAGNOSIS — N83202 Unspecified ovarian cyst, left side: Secondary | ICD-10-CM | POA: Diagnosis not present

## 2016-07-27 DIAGNOSIS — R1032 Left lower quadrant pain: Secondary | ICD-10-CM | POA: Diagnosis not present

## 2016-08-08 DIAGNOSIS — M329 Systemic lupus erythematosus, unspecified: Secondary | ICD-10-CM | POA: Diagnosis not present

## 2016-08-08 DIAGNOSIS — M797 Fibromyalgia: Secondary | ICD-10-CM | POA: Diagnosis not present

## 2016-08-08 DIAGNOSIS — M25569 Pain in unspecified knee: Secondary | ICD-10-CM | POA: Diagnosis not present

## 2016-08-08 DIAGNOSIS — D8989 Other specified disorders involving the immune mechanism, not elsewhere classified: Secondary | ICD-10-CM | POA: Diagnosis not present

## 2016-08-08 DIAGNOSIS — M810 Age-related osteoporosis without current pathological fracture: Secondary | ICD-10-CM | POA: Diagnosis not present

## 2016-08-15 ENCOUNTER — Encounter: Payer: Self-pay | Admitting: Gastroenterology

## 2016-08-15 ENCOUNTER — Ambulatory Visit (INDEPENDENT_AMBULATORY_CARE_PROVIDER_SITE_OTHER): Payer: Medicare Other | Admitting: Gastroenterology

## 2016-08-15 ENCOUNTER — Encounter (INDEPENDENT_AMBULATORY_CARE_PROVIDER_SITE_OTHER): Payer: Self-pay

## 2016-08-15 ENCOUNTER — Other Ambulatory Visit: Payer: Medicare Other

## 2016-08-15 VITALS — BP 132/74 | HR 92 | Ht 59.0 in | Wt 108.4 lb

## 2016-08-15 DIAGNOSIS — R140 Abdominal distension (gaseous): Secondary | ICD-10-CM | POA: Insufficient documentation

## 2016-08-15 DIAGNOSIS — K589 Irritable bowel syndrome without diarrhea: Secondary | ICD-10-CM | POA: Diagnosis not present

## 2016-08-15 DIAGNOSIS — K30 Functional dyspepsia: Secondary | ICD-10-CM | POA: Diagnosis not present

## 2016-08-15 MED ORDER — MIRTAZAPINE 15 MG PO TABS: 15.0000 mg | ORAL_TABLET | Freq: Every day | ORAL | 1 refills | Status: DC

## 2016-08-15 NOTE — Progress Notes (Signed)
08/15/2016 Tracy Malone [id number] 1931/09/21  Review of previous GI testing; Dr. Benson Norway and changed to Dr. Ardis Hughs 10/2014:  -CBC, CMP, and TSH were normal in 07/2014 and again in 08/2014. CT scan02/26/2016. This was done without contrast due to dye allergy.  This showed no acute findings in the abdomen and pelvis specifically to explain her history of abdominal pain. There is a 3.0 cm left ovarian cystic lesion that is likely benign. As I stated above, she is following with gynecology and they're going to monitor that, but they think that it is benign.  Stomach, duodenum, small bowel, and colon grossly unremarkable.Abdominal ultrasound on 09/04/2014 showed no acute or chronic abnormalities of the hepatobiliary tree; evaluation of the pancreatic tail was limited. There is no acute abnormalities of the kidney demonstrated. A 1.6 cm diameter mid to lower pole cyst was noted in the right kidney.  August 2015 HIDA scan with [facility]CK, which was normal.  This was performed for complaints of upper abdominal pain, appetite loss, fatigue, and weight loss at that time as well.EGD was in December 2013 at which time she was found to have moderate gastritis. Biopsies actually showed H. Pylori with chronic active gastritis, however, after taking 1 dose of the Pylera medication she discontinued it because of side effects (it made her feel horrible).  Therefore, she has not completed treatment for H. Pylori. Duodenal biopsies were unremarkable at that time.  flexible sigmoidoscopy also in December 2013 that was normal with only medium hemorrhoids noted. This was performed for diarrhea and biopsies of the descending and sigmoid colon were unremarkable. She states that she had a full colonoscopy prior to that with Dr. Benson Norway, however, after calling his office they stated they have never performed a full colonoscopy on her. Gastric emptying scan in January 2014 showed 33% of the ingested activity remaining in the stomach which is  mildly abnormal compared to the normal range of less than 30% of the ingested activity remaining at 120 minutes.  Repeat GES 09/2014 was normal.  EGD 11/2014 with Dr. Ardis Hughs showed pan-gastritis with positive Hpylori on biopsies; also small hiatal hernia.  Saw ID and was apparently treated with subsequent testing negative.  Repeat abdominal ultrasound 09/2015 was normal.  CC:  Bloating, poor appetite  HISTORY OF PRESENT ILLNESS:  This is an 83 year old female who is known to Dr. Ardis Hughs. She is here today with her husband again with complaints of upper abdominal bloating, loss of appetite, weight loss. She says that she tried IBgard and it did not help.  She is concerned because she is losing weight. Her weight is down about 5 pounds since her last visit here at the end of October. She's had extensive evaluation. On one CT scan it was noted that she had an ovarian cyst, but she follows with Dr. Helane Rima regularly and had a recent pelvic ultrasound after which she was told that everything was fine with that. She does have a history of H. pylori and is concerned that she may have that again. She was treated by Dr. Baxter Flattery of infectious disease due to her multiple medication allergies. She says that after her treatment she had negative study (and I see that here in EPIC from 03/2015), but she would like to be tested again.   Past Medical History:  Diagnosis Date  . Bell's palsy 1966   ? right side facial droop  . Carotid artery disease ([place]) 2010   on vascular screening;unchanged 2013.(could not tolerate simvastatin,  no other statins tried)--<30% blockage bilat 07/2011  . Chronic abdominal pain   . Chronic fatigue and malaise   . Claustrophobia   . Depression    treated in the past for years;stopped in 2010 for a years  . Duodenal ulcer 1962   h/o  . Fibromyalgia   . 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 GERD (gastroesophageal reflux disease)   . Glaucoma,  narrow-angle    s/p laser surgery  . Hyperthyroidism    h/o x 2 years  . Hypothyroid 9/08  . IBS (irritable bowel syndrome)    Dr. Benson Norway  . Ocular migraine   . Osteoporosis 10/11   Dr.Hawkes  . Panic attack   . Recurrent UTI    has cystocele-Dr.Grewal  . Shingles 1999   h/o  . SLE (systemic lupus erythematosus) (HCC)    with arthralgia/myalgia, positive ANA centromere pattern, positive DNA, positive RNP, indeterminant anti-cardiolipin antibody-Dr.Hawkes.  . Superficial thrombophlebitis 03/2009   RLE  . Trochanteric bursitis 12/2008   bilateral   Past Surgical History:  Procedure Laterality Date  . ABDOMINAL HYSTERECTOMY    . CATARACT EXTRACTION, BILATERAL  1995, 1996  . Flexible sigmoidoscopy    . THYROIDECTOMY, PARTIAL  09/2005   L nodule; Dr. Harlow Asa  . TONSILLECTOMY  1946  . UPPER GI ENDOSCOPY  06/27/12  . VAGINAL HYSTERECTOMY  1971   and bladder repair.  Still has ovaries    reports that she has never smoked. She has never used smokeless tobacco. She reports that she does not drink alcohol or use drugs. family history includes Diabetes in her maternal grandfather; Heart disease in her brother and mother; Hypertension in her mother and sister; Lung cancer in her brother. Allergies  Allergen Reactions  . Iodine Anaphylaxis  . Levsin [Hyoscyamine Sulfate]     Vision problems/pt has glaucoma  . Salmon [Fish Allergy] Hives and Shortness Of Breath  . Shellfish Allergy Anaphylaxis  . Aspirin Other (See Comments)    Sever stomach pain due to ulcer scaring.  . Ciprofloxacin Diarrhea  . Codeine Nausea And Vomiting  . Darvocet [Propoxyphene N-Acetaminophen] Nausea And Vomiting  . Demerol [Meperidine] Nausea Only  . Dexilant [Dexlansoprazole] Swelling  . Diphedryl [Diphenhydramine] Other (See Comments)    Increased pulse/small amount ok  . Doxycycline Hyclate Other (See Comments)    GI intolerance.  Marland Kitchen Epinephrine Other (See Comments)    Breathing problems  . Erythromycin  Other (See Comments)    GI intolerance.  Yvette Rack [Cyclobenzaprine] Other (See Comments)    Tingly/prickly sensation.  Marland Kitchen Keflex [Cephalexin] Hives  . Latex Other (See Comments)    Gloves ok  . Prednisone Other (See Comments)    Headache  . Pylera [Bis Subcit-Metronid-Tetracyc] Swelling    Tongue swelling. Face tingling  . Sulfa Antibiotics Other (See Comments)    Increased pulse, fainting, diarrhea, thrush  . Xylocaine [Lidocaine Hcl]     With epinephrine, given by dentist.  Speeded up heart rate and she passed out (occured twice, at dentist)  . Zoloft [Sertraline Hcl] Swelling and Other (See Comments)    Migraine Swelling of tongue/lip (09/2012)  . Advil [Ibuprofen] Other (See Comments)    Motrin ok with a GI effect.  . Clarithromycin Rash    Started after completing 10 day course of 2000 mg /day  . Penicillins Hives and Rash      Outpatient Encounter Prescriptions as of 08/15/2016  Medication Sig  . acetaminophen (  TYLENOL) 650 MG CR tablet Take 650 mg by mouth as needed for pain. Reported on 09/06/2015  . ALPRAZolam (XANAX) 0.5 MG tablet Take 0.25 mg by mouth 2 (two) times daily as needed.   . ARTIFICIAL TEAR OP Apply 1-2 drops to eye 4 (four) times daily as needed (dry eyes).   Marland Kitchen b complex vitamins capsule Take 1 capsule by mouth as needed.   . Cholecalciferol (VITAMIN D) 2000 UNITS tablet Take 2,000 Units by mouth daily.  Marland Kitchen levothyroxine (SYNTHROID) 25 MCG tablet Take 50 mcg by mouth daily before breakfast.   . Loperamide HCl (IMODIUM PO) Take 1 tablet by mouth as needed. Reported on 09/06/2015  . vitamin E 400 UNIT capsule Take 400 Units by mouth every Monday, Wednesday, and Friday. Reported on 09/06/2015  . Flaxseed, Linseed, (FLAX SEEDS PO) Take by mouth.  Marland Kitchen omeprazole (PRILOSEC) 20 MG capsule Take 1 capsule (20 mg total) by mouth every morning. (Patient not taking: Reported on 08/15/2016)  . [DISCONTINUED] AMBULATORY NON FORMULARY MEDICATION IBgard Take 1 capsule by mouth  twice a day (Patient not taking: Reported on 05/22/2016)  . [DISCONTINUED] FLUoxetine (PROZAC) 10 MG tablet Take 10 mg by mouth daily.   No facility-administered encounter medications on file as of 08/15/2016.      REVIEW OF SYSTEMS  : All other systems reviewed and negative except where noted in the History of Present Illness.   PHYSICAL EXAM: BP 132/74   Pulse 92   Ht 4\' 11"  (1.499 m)   Wt 108 lb 6.4 oz (49.2 kg)   BMI 21.89 kg/m  General: Well developed white female in no acute distress Head: Normocephalic and atraumatic Eyes:  Sclerae anicteric, conjunctiva pink. Ears: Normal auditory acuity Lungs: Clear throughout to auscultation; no increased WOB Heart: Regular rate and rhythm Abdomen: Soft, non-distended.  Normal bowel sounds.  Mild epigastric TTP. Musculoskeletal: Symmetrical with no gross deformities  Skin: No lesions on visible extremities Extremities: No edema  Neurological: Alert oriented x 4, grossly non-focal Psychological:  Alert and cooperative. Normal mood and affect  ASSESSMENT AND PLAN: *83 y.o. female with chronic complaints of intermittent left upper quadrant bloating, loss of appetite and weight loss on and off *History of Hpylori:  Was treated by ID due to her several medication allergies.   She has had extensive evaluation of the same symptoms in the past, specifically over the past 2 years with CAT scan, ultrasounds, upper endoscopy, colonoscopy, GES, HIDA scan, none of these show anything significant findings. All of these symptoms are c/w functional dyspepsia/IBS.  I offered her a lot of reassurance today.  She would like to be re-tested for Hpylori.  I have discussed with her using anti-depressants/anxiolytic to help treat her GI symptoms as well.  She has an appt with her PCP soon and will discuss with them again.  I have given her a prescription of Remeron 15 mg to take at bedtime if she decides to proceed with that.  Desipramine or buspirone would also  be options.  Will follow-up in 6-8 weeks.     CC:  Lajean Manes, MD

## 2016-08-15 NOTE — Patient Instructions (Signed)
We have sent the following medications to your pharmacy for you to pick up at your convenience:  Remeron  Your physician has requested that you go to the basement for the following lab work before leaving today:  H pylori  Please follow up with Dr. Ardis Hughs on 10/11/2016 at 3:45pm

## 2016-08-16 ENCOUNTER — Other Ambulatory Visit: Payer: Medicare Other

## 2016-08-16 DIAGNOSIS — K589 Irritable bowel syndrome without diarrhea: Secondary | ICD-10-CM

## 2016-08-16 DIAGNOSIS — K30 Functional dyspepsia: Secondary | ICD-10-CM

## 2016-08-16 DIAGNOSIS — R140 Abdominal distension (gaseous): Secondary | ICD-10-CM

## 2016-08-16 NOTE — Progress Notes (Signed)
I agree with the above note, plan 

## 2016-08-17 DIAGNOSIS — N644 Mastodynia: Secondary | ICD-10-CM | POA: Diagnosis not present

## 2016-08-17 LAB — HELICOBACTER PYLORI  SPECIAL ANTIGEN: H. PYLORI Antigen: NOT DETECTED

## 2016-08-18 ENCOUNTER — Other Ambulatory Visit: Payer: Self-pay | Admitting: Obstetrics and Gynecology

## 2016-08-18 DIAGNOSIS — N644 Mastodynia: Secondary | ICD-10-CM

## 2016-08-21 ENCOUNTER — Telehealth: Payer: Self-pay | Admitting: *Deleted

## 2016-08-21 NOTE — Telephone Encounter (Signed)
Patient left a message on my voicemail asking me to ask you if you would take her back as a patient-she said she does not like being a patient at [facility]. Please advise.

## 2016-08-22 DIAGNOSIS — F322 Major depressive disorder, single episode, severe without psychotic features: Secondary | ICD-10-CM | POA: Diagnosis not present

## 2016-08-22 DIAGNOSIS — K580 Irritable bowel syndrome with diarrhea: Secondary | ICD-10-CM | POA: Diagnosis not present

## 2016-08-23 ENCOUNTER — Other Ambulatory Visit: Payer: Self-pay

## 2016-08-23 NOTE — Telephone Encounter (Signed)
Advise patient I'd be happy to take her back.  But, I'm not sure really what she expects--I likely will not be able to provide the answers she is looking for, as clearly she has sought care from others.   I will need [facility] records sent.

## 2016-08-23 NOTE — Telephone Encounter (Signed)
Patient advised.

## 2016-08-25 ENCOUNTER — Ambulatory Visit (INDEPENDENT_AMBULATORY_CARE_PROVIDER_SITE_OTHER): Payer: Medicare Other | Admitting: Gastroenterology

## 2016-08-25 ENCOUNTER — Encounter: Payer: Self-pay | Admitting: Gastroenterology

## 2016-08-25 VITALS — BP 120/70 | HR 72 | Ht 58.75 in | Wt 108.4 lb

## 2016-08-25 DIAGNOSIS — K2289 Other specified disease of esophagus: Secondary | ICD-10-CM

## 2016-08-25 DIAGNOSIS — K228 Other specified diseases of esophagus: Secondary | ICD-10-CM | POA: Diagnosis not present

## 2016-08-25 NOTE — Patient Instructions (Signed)
You will be set up for an upper endoscopy for lower esophagus pain, weight loss.

## 2016-08-25 NOTE — Progress Notes (Signed)
Review of previous GI testing; Dr. Benson Norway and changed to Dr. Ardis Hughs 10/2014:  -CBC, CMP, and TSH were normal in 07/2014 and again in 08/2014. CT scan02/26/2016.This was done without contrast due to dye allergy.This showed no acute findings in the abdomen and pelvis specifically to explain her history of abdominal pain. There is a 3.0 cm left ovarian cystic lesion that is likely benign. As I stated above, she is following with gynecology and they're going to monitor that, but they think that it is benign.Stomach, duodenum, small bowel, and colon grossly unremarkable.Abdominal ultrasound on 02/12/2016showed no acute or chronic abnormalities of the hepatobiliary tree; evaluation of the pancreatic tail was limited. There is no acute abnormalities of the kidney demonstrated. A 1.6 cm diameter mid to lower pole cyst was noted in the right kidney. August 2015 HIDA scan with CCK, which was normal.This was performed for complaints of upper abdominal pain, appetite loss, fatigue, and weight loss at that time as well.EGD was in December 2013at which time she was found to have moderate gastritis. Biopsies actually showed H. Pylori with chronic active gastritis, however, after taking 1 dose of the Pylera medication she discontinued it because of side effects (it made her feel horrible).Therefore, she has not completed treatment for H. Pylori. Duodenal biopsies were unremarkable at that time.flexible sigmoidoscopy also in December 2013that was normal with only medium hemorrhoids noted. This was performed for diarrhea and biopsies of the descending and sigmoid colon were unremarkable. She states that she had a full colonoscopy prior to that with Dr. Benson Norway, however, after calling his office they stated they have never performed a full colonoscopy on her. Gastric emptying scan in January 2014showed 33% of the ingested activity remaining in the stomach which is mildly abnormal compared to the normal range of less than 30%  of the ingested activity remaining at 120 minutes.Repeat GES 3/2016was normal. EGD 11/2014 with Dr. Ardis Hughs showed pan-gastritis with positive Hpylori on biopsies; also small hiatal hernia. Saw ID and was apparently treated with subsequent testing negative. Repeat abdominal ultrasound 09/2015 was normal.  07/2016 H. Pylori stool antigen was negative.    HPI: This is a    83 year old woman whom I last saw about a year ago. She is here with her husband today. She was here in our office 10 days ago and saw Tracy Malone. This appointment was not canceled in error. She was actually scheduled to see me in about 2 months. When she came in and we explained her of the error she still wanted to be seen  Chief complaint is abdominal pain, epigastrium, nausea.    These are constant. She is very worried that she has something serious going on  She tried remeron but this caused trouble with her eyes and so she stopped.  "my esophagus hurts"  She says her food does not digest.  She says this has been going on for 3.5 monhts  when I pointed out to her that she has had an extreme number of gastrointestinal tests, she said she wants more testing because now she is losing weight. I do document that she is down about 5 pounds since her last visit 1 year ago.    ROS: complete GI ROS as described in HPI.  Constitutional:  No unintentional weight loss   Past Medical History:  Diagnosis Date  . Bell's palsy 1966   ? right side facial droop  . Carotid artery disease ([facility]) 2010   on vascular screening;unchanged 2013.(could not tolerate simvastatin, no other  statins tried)--<30% blockage bilat 07/2011  . Chronic abdominal pain   . Chronic fatigue and malaise   . Claustrophobia   . Depression    treated in the past for years;stopped in 2010 for a years  . Duodenal ulcer 1962   h/o  . Fibromyalgia   . Frequent PVCs 07/2012   Seen by [facility]: benign, asymptomatic, normal EF  . GERD (gastroesophageal reflux  disease)   . Glaucoma, narrow-angle    s/p laser surgery  . Hyperthyroidism    h/o x 2 years  . Hypothyroid 9/08  . IBS (irritable bowel syndrome)    Dr. Benson Norway  . Ocular migraine   . Osteoporosis 10/11   Dr.Hawkes  . Panic attack   . Recurrent UTI    has cystocele-Dr.Grewal  . Shingles 1999   h/o  . SLE (systemic lupus erythematosus) (HCC)    with arthralgia/myalgia, positive ANA centromere pattern, positive DNA, positive RNP, indeterminant anti-cardiolipin antibody-Dr.Hawkes.  . Superficial thrombophlebitis 03/2009   RLE  . Trochanteric bursitis 12/2008   bilateral    Past Surgical History:  Procedure Laterality Date  . ABDOMINAL HYSTERECTOMY    . CATARACT EXTRACTION, BILATERAL  1995, 1996  . Flexible sigmoidoscopy    . THYROIDECTOMY, PARTIAL  09/2005   L nodule; Dr. Harlow Asa  . TONSILLECTOMY  1946  . UPPER GI ENDOSCOPY  06/27/12  . VAGINAL HYSTERECTOMY  1971   and bladder repair.  Still has ovaries    Current Outpatient Prescriptions  Medication Sig Dispense Refill  . acetaminophen (TYLENOL) 650 MG CR tablet Take 650 mg by mouth as needed for pain. Reported on 09/06/2015    . ALPRAZolam (XANAX) 0.5 MG tablet Take 0.25 mg by mouth 2 (two) times daily as needed.     . ARTIFICIAL TEAR OP Apply 1-2 drops to eye 4 (four) times daily as needed (dry eyes).     Marland Kitchen b complex vitamins capsule Take 1 capsule by mouth as needed.     . Cholecalciferol (VITAMIN D) 2000 UNITS tablet Take 2,000 Units by mouth daily.    . Flaxseed, Linseed, (FLAX SEEDS PO) Take by mouth.    . levothyroxine (SYNTHROID) 25 MCG tablet Take 50 mcg by mouth daily before breakfast.     . Loperamide HCl (IMODIUM PO) Take 1 tablet by mouth as needed. Reported on 09/06/2015    . omeprazole (PRILOSEC) 20 MG capsule Take 1 capsule (20 mg total) by mouth every morning. 30 capsule 3  . vitamin E 400 UNIT capsule Take 400 Units by mouth every Monday, Wednesday, and Friday. Reported on 09/06/2015     No current  facility-administered medications for this visit.     Allergies as of 08/25/2016 - Review Complete 08/15/2016  Allergen Reaction Noted  . Iodine Anaphylaxis 05/13/2012  . Levsin [hyoscyamine sulfate]  10/13/2015  . Salmon [fish allergy] Hives and Shortness Of Breath 05/13/2012  . Shellfish allergy Anaphylaxis 05/13/2012  . Remeron [mirtazapine] Other (See Comments) 08/25/2016  . Aspirin Other (See Comments) 05/13/2012  . Ciprofloxacin Diarrhea 05/13/2012  . Codeine Nausea And Vomiting 05/13/2012  . Darvocet [propoxyphene n-acetaminophen] Nausea And Vomiting 05/13/2012  . Demerol [meperidine] Nausea Only 05/13/2012  . Dexilant [dexlansoprazole] Swelling 03/01/2016  . Diphedryl [diphenhydramine] Other (See Comments) 05/13/2012  . Doxycycline hyclate Other (See Comments) 05/13/2012  . Epinephrine Other (See Comments) 05/13/2012  . Erythromycin Other (See Comments) 05/13/2012  . Flexeril [cyclobenzaprine] Other (See Comments) 05/13/2012  . Keflex [cephalexin] Hives 05/13/2012  . Latex Other (See  Comments) 05/13/2012  . Prednisone Other (See Comments) 05/13/2012  . Pylera [bis subcit-metronid-tetracyc] Swelling 08/01/2012  . Sulfa antibiotics Other (See Comments) 05/13/2012  . Xylocaine [lidocaine hcl]  05/13/2012  . Zoloft [sertraline hcl] Swelling and Other (See Comments) 05/13/2012  . Advil [ibuprofen] Other (See Comments) 05/13/2012  . Clarithromycin Rash 01/14/2015  . Penicillins Hives and Rash 05/13/2012    Family History  Problem Relation Age of Onset  . Heart disease Mother   . Hypertension Mother   . Hypertension Sister   . Heart disease Brother   . Lung cancer Brother     lung  . Diabetes Maternal Grandfather     Social History   Social History  . Marital status: Married    Spouse name: N/A  . Number of children: 2  . Years of education: N/A   Occupational History  . retired (school system)    Social History Main Topics  . Smoking status: Never Smoker  .  Smokeless tobacco: Never Used  . Alcohol use No  . Drug use: No  . Sexual activity: No   Other Topics Concern  . Not on file   Social History Narrative   Married.  Son lives in [place]; Daughter [name] lives in [place]; 2 grandchildren     Physical Exam: Ht 4' 10.75" (1.492 m)   Wt 108 lb 6 oz (49.2 kg)   BMI 22.08 kg/m  Constitutional: generally well-appearing Psychiatric: alert and oriented x3 Abdomen: soft, nontender, nondistended, no obvious ascites, no peritoneal signs, normal bowel sounds No peripheral edema noted in lower extremities  Assessment and plan: 83 y.o. female with Chronic pan gastrointestinal GI symptoms, now with some weight loss  I think it is unlikely that there is anything serious going on after the 10-12 gastrointestinal tests that she has had over the past for 5 years. She does point out that she has lost about 5 pounds recently and and I do see that that is true. She is very concerned that she has something serious going on despite reassurance. She insists on further testing and I offered upper endoscopy and we'll proceed with that at her soonest convenience.  Please see the "Patient Instructions" section for addition details about the plan.  Tracy Loffler, MD [facility] 08/25/2016, 9:49 AM

## 2016-08-29 ENCOUNTER — Ambulatory Visit
Admission: RE | Admit: 2016-08-29 | Discharge: 2016-08-29 | Disposition: A | Discharge status: Home / Self Care | Payer: Medicare Other | Source: Ambulatory Visit | Attending: Obstetrics and Gynecology | Admitting: Obstetrics and Gynecology

## 2016-08-29 DIAGNOSIS — N644 Mastodynia: Secondary | ICD-10-CM

## 2016-08-30 ENCOUNTER — Telehealth: Payer: Self-pay | Admitting: Gastroenterology

## 2016-08-30 NOTE — Telephone Encounter (Signed)
The pt was rescheduled to an earlier date and time and was advised to increase her omeprazole to twice daily for the recurrent reflux and burning in the esophagus.  Pt verbalized understanding of the new instructions

## 2016-08-31 DIAGNOSIS — D692 Other nonthrombocytopenic purpura: Secondary | ICD-10-CM | POA: Diagnosis not present

## 2016-08-31 DIAGNOSIS — D1801 Hemangioma of skin and subcutaneous tissue: Secondary | ICD-10-CM | POA: Diagnosis not present

## 2016-08-31 DIAGNOSIS — D225 Melanocytic nevi of trunk: Secondary | ICD-10-CM | POA: Diagnosis not present

## 2016-08-31 DIAGNOSIS — L853 Xerosis cutis: Secondary | ICD-10-CM | POA: Diagnosis not present

## 2016-08-31 DIAGNOSIS — L821 Other seborrheic keratosis: Secondary | ICD-10-CM | POA: Diagnosis not present

## 2016-08-31 DIAGNOSIS — D1721 Benign lipomatous neoplasm of skin and subcutaneous tissue of right arm: Secondary | ICD-10-CM | POA: Diagnosis not present

## 2016-08-31 DIAGNOSIS — L650 Telogen effluvium: Secondary | ICD-10-CM | POA: Diagnosis not present

## 2016-09-04 ENCOUNTER — Ambulatory Visit (INDEPENDENT_AMBULATORY_CARE_PROVIDER_SITE_OTHER): Payer: Medicare Other | Admitting: Licensed Clinical Social Worker

## 2016-09-04 ENCOUNTER — Encounter: Payer: Self-pay | Admitting: Geriatric Medicine

## 2016-09-04 DIAGNOSIS — F3341 Major depressive disorder, recurrent, in partial remission: Secondary | ICD-10-CM

## 2016-09-05 ENCOUNTER — Encounter: Payer: Self-pay | Admitting: Neurology

## 2016-09-05 ENCOUNTER — Ambulatory Visit (INDEPENDENT_AMBULATORY_CARE_PROVIDER_SITE_OTHER): Payer: Medicare Other | Admitting: Neurology

## 2016-09-05 VITALS — BP 114/60 | HR 76 | Resp 16 | Ht <= 58 in | Wt 109.5 lb

## 2016-09-05 DIAGNOSIS — G250 Essential tremor: Secondary | ICD-10-CM | POA: Diagnosis not present

## 2016-09-05 DIAGNOSIS — M7550 Bursitis of unspecified shoulder: Secondary | ICD-10-CM | POA: Diagnosis not present

## 2016-09-05 DIAGNOSIS — W19XXXA Unspecified fall, initial encounter: Secondary | ICD-10-CM | POA: Insufficient documentation

## 2016-09-05 NOTE — Progress Notes (Signed)
[facility]  PATIENT: Tracy Malone DOB: 07-23-1932  REFERRING DOCTOR OR PCP:  Joselyn Arrow SOURCE: patient and EMR records  _________________________________   HISTORICAL  CHIEF COMPLAINT:  Chief Complaint  Patient presents with  . Tremors    Sts. tremor is about the same.  Sts. she would like to discuss continued right shoulder pain./fim    HISTORY OF PRESENT ILLNESS:  Tracy Malone is an 83 year old woman with tremor and shoulder pain  Tremor:   Her tremor is about the same.    She notes it is worse if she is aggravated.   Her tremor will worsen if she is anxious or someone is watchiing her.She is on alprazolam and notes a benefit.   She takes only 0.25-0.5 mg 2-3 times a day.    She first noted a right hand tremor in late 2015.    She notes the tremor in the right hand much more than the left.   She specifically notes it when she is holding a utensil like a spoon or fork. She has heavier utensils and a thicker pen with benefit.   She takes Xanax for anxiety, 0.25 mg twice a day and  her tremor is better for a few hours after each pill. However, it makes her sleepy.     Tremor is not present when asleep.    She has not noted the tremor in the head.   She has not had any difficulty with her gait or balance. However she has two falls in the last 2 years, both when she tripped   She has no family history of tremors.  Fall:   She fell yesterday and bruised her left side.   She tripped in the closet when her foot slipped (was wearing socks on a wood floor).     Shoulder pain:   Her right shoulder is much worse the past month. He shoulder pain will worsen if she externally rotates or elevates the arm.  Keeping the arm in will decrease the pain. NSAIDs have not helped much.   In the past, pain improved with subacromial bursa injection   REVIEW OF SYSTEMS: Constitutional: No fevers, chills, sweats, or change in appetite Eyes: No visual changes, double vision, eye  pain Ear, nose and throat: No hearing loss, ear pain, nasal congestion, sore throat Cardiovascular: No chest pain, palpitations.  Has had palpitations Respiratory: No shortness of breath at rest or with exertion.   No wheezes GastrointestinaI: No nausea, vomiting, diarrhea, abdominal pain, fecal incontinence Genitourinary: No dysuria, urinary retention or frequency.  No nocturia. Musculoskeletal: No neck pain, back pain Integumentary: No rash, pruritus, skin lesions Neurological: as above Psychiatric: No depression at this time.  Some anxiety Endocrine: No palpitations, diaphoresis, change in appetite, change in weigh or increased thirst Hematologic/Lymphatic: No anemia, purpura, petechiae. Allergic/Immunologic: No itchy/runny eyes, nasal congestion, recent allergic reactions, rashes  ALLERGIES: Allergies  Allergen Reactions  . Iodine Anaphylaxis  . Levsin [Hyoscyamine Sulfate]     Vision problems/pt has glaucoma  . Salmon [Fish Allergy] Hives and Shortness Of Breath  . Shellfish Allergy Anaphylaxis  . Remeron [Mirtazapine] Other (See Comments)    Cause blurred vision and red eyes, pt has glaucoma  . Aspirin Other (See Comments)    Sever stomach pain due to ulcer scaring.  . Ciprofloxacin Diarrhea  . Codeine Nausea And Vomiting  . Darvocet [Propoxyphene N-Acetaminophen] Nausea And Vomiting  . Demerol [Meperidine] Nausea Only  . Dexilant [Dexlansoprazole] Swelling  .  Diphedryl [Diphenhydramine] Other (See Comments)    Increased pulse/small amount ok  . Doxycycline Hyclate Other (See Comments)    GI intolerance.  Marland Kitchen Epinephrine Other (See Comments)    Breathing problems  . Erythromycin Other (See Comments)    GI intolerance.  [name] [Cyclobenzaprine] Other (See Comments)    Tingly/prickly sensation.  Marland Kitchen Keflex [Cephalexin] Hives  . Latex Other (See Comments)    Gloves ok  . Prednisone Other (See Comments)    Headache  . Pylera [Bis Subcit-Metronid-Tetracyc] Swelling     Tongue swelling. Face tingling  . Sulfa Antibiotics Other (See Comments)    Increased pulse, fainting, diarrhea, thrush  . Xylocaine [Lidocaine Hcl]     With epinephrine, given by dentist.  Speeded up heart rate and she passed out (occured twice, at dentist)  . Zoloft [Sertraline Hcl] Swelling and Other (See Comments)    Migraine Swelling of tongue/lip (09/2012)  . Advil [Ibuprofen] Other (See Comments)    Motrin ok with a GI effect.  . Clarithromycin Rash    Started after completing 10 day course of 2000 mg /day  . Penicillins Hives and Rash    HOME MEDICATIONS:  Current Outpatient Prescriptions:  .  acetaminophen (TYLENOL) 650 MG CR tablet, Take 650 mg by mouth as needed for pain. Reported on 09/06/2015, Disp: , Rfl:  .  ALPRAZolam (XANAX) 0.5 MG tablet, Take 0.25 mg by mouth 2 (two) times daily as needed. , Disp: , Rfl:  .  ARTIFICIAL TEAR OP, Apply 1-2 drops to eye 4 (four) times daily as needed (dry eyes). , Disp: , Rfl:  .  b complex vitamins capsule, Take 1 capsule by mouth as needed. , Disp: , Rfl:  .  Cholecalciferol (VITAMIN D) 2000 UNITS tablet, Take 2,000 Units by mouth daily., Disp: , Rfl:  .  Flaxseed, Linseed, (FLAX SEEDS PO), Take by mouth., Disp: , Rfl:  .  levothyroxine (SYNTHROID) 25 MCG tablet, Take 50 mcg by mouth daily before breakfast. , Disp: , Rfl:  .  Loperamide HCl (IMODIUM PO), Take 1 tablet by mouth as needed. Reported on 09/06/2015, Disp: , Rfl:  .  omeprazole (PRILOSEC) 20 MG capsule, Take 1 capsule (20 mg total) by mouth every morning., Disp: 30 capsule, Rfl: 3 .  vitamin E 400 UNIT capsule, Take 400 Units by mouth every Monday, Wednesday, and Friday. Reported on 09/06/2015, Disp: , Rfl:   PAST MEDICAL HISTORY: Past Medical History:  Diagnosis Date  . Bell's palsy 1966   ? right side facial droop  . Carotid artery disease ([facility]) 2010   on vascular screening;unchanged 2013.(could not tolerate simvastatin, no other statins tried)--<30% blockage bilat  07/2011  . Chronic abdominal pain   . Chronic fatigue and malaise   . Claustrophobia   . Depression    treated in the past for years;stopped in 2010 for a years  . Duodenal ulcer 1962   h/o  . Fibromyalgia   . Frequent PVCs 07/2012   Seen by [facility]: benign, asymptomatic, normal EF  . GERD (gastroesophageal reflux disease)   . Glaucoma, narrow-angle    s/p laser surgery  . Hyperthyroidism    h/o x 2 years  . Hypothyroid 9/08  . IBS (irritable bowel syndrome)    Dr. Elnoria Howard  . Ocular migraine   . Osteoporosis 10/11   Dr.Hawkes  . Panic attack   . Recurrent UTI    has cystocele-Dr.Grewal  . Shingles 1999   h/o  . SLE (systemic lupus  erythematosus) ([facility]CC)    with arthralgia/myalgia, positive ANA centromere pattern, positive DNA, positive RNP, indeterminant anti-cardiolipin antibody-Dr.Hawkes.  . Superficial thrombophlebitis 03/2009   RLE  . Trochanteric bursitis 12/2008   bilateral    PAST SURGICAL HISTORY: Past Surgical History:  Procedure Laterality Date  . ABDOMINAL HYSTERECTOMY    . CATARACT EXTRACTION, BILATERAL  1995, 1996  . Flexible sigmoidoscopy    . THYROIDECTOMY, PARTIAL  09/2005   L nodule; Dr. Gerrit Friends  . TONSILLECTOMY  1946  . UPPER GI ENDOSCOPY  06/27/12  . VAGINAL HYSTERECTOMY  1971   and bladder repair.  Still has ovaries    FAMILY HISTORY: Family History  Problem Relation Age of Onset  . Heart disease Mother   . Hypertension Mother   . Hypertension Sister   . Heart disease Brother   . Lung cancer Brother     lung  . Diabetes Maternal Grandfather     SOCIAL HISTORY:  Social History   Social History  . Marital status: Married    Spouse name: N/A  . Number of children: 2  . Years of education: N/A   Occupational History  . retired (school system)    Social History Main Topics  . Smoking status: Never Smoker  . Smokeless tobacco: Never Used  . Alcohol use No  . Drug use: No  . Sexual activity: No   Other Topics Concern  . Not on  file   Social History Narrative   Married.  Son lives in [place]; Daughter [name] lives in [place]; 2 grandchildren     PHYSICAL EXAM  Vitals:   09/05/16 1250  BP: 114/60  Pulse: 76  Resp: 16  Weight: 109 lb 8 oz (49.7 kg)  Height: 4\' 10"  (1.473 m)    Body mass index is 22.89 kg/m.   General: The patient is well-developed and well-nourished and in no acute distress  Musculoskeletal:  She is nontender over the subacromial bursae and the glenohumeral joints of the shoulders with good ROM.     Feet/ankles tender to deep palpation  Skin:   No edema.    Mild discoloration spots in ankle region bilaterally up to mid-shin.    Neurologic Exam  Mental status: The patient is alert and oriented x 3 at the time of the examination. The patient has apparent normal recent and remote memory, with an apparently normal attention span and concentration ability.   Speech is normal.  Cranial nerves: Extraocular movements are full.   Facial strength is normal.  Trapezius and sternocleidomastoid strength is normal. No dysarthria is noted.    Motor:  There is no bradykinesia. Fast 7 hz Tremor increases with sustained posture.   Muscle bulk is normal.   Tone is normal. Strength is  5 / 5 in all 4 extremities.   Sensory: Sensory testing is intact to touch and vibration sensation in all 4 extremities.  Coordination: Cerebellar testing reveals good finger-nose-finger bilaterally.  Gait and station: Station is normal.   Gait is normal for age. Tandem gait is normal. No retropulsion with mild disturbance of posture.  Mild retropulsion when posture disturbed more. Romberg is negative.   Reflexes: Deep tendon reflexes are symmetric and normal bilaterally.    DIAGNOSTIC DATA (LABS, IMAGING, TESTING) - I reviewed patient records, labs, notes, testing and imaging myself where available.  Lab Results  Component Value Date   WBC 11.8 (H) 09/29/2015   HGB 15.3 (H) 12/17/2015   HCT 45.0 12/17/2015   MCV  89.3 09/29/2015  PLT 268.0 09/29/2015      Component Value Date/Time   NA 141 12/17/2015 1757   K 4.8 12/17/2015 1757   CL 105 12/17/2015 1757   CO2 27 02/01/2015 0001   GLUCOSE 103 (H) 12/17/2015 1757   BUN 23 (H) 12/17/2015 1757   CREATININE 0.70 12/17/2015 1757   CREATININE 0.83 02/01/2015 0001   CALCIUM 9.3 02/01/2015 0001   PROT 7.0 09/29/2015 1216   ALBUMIN 4.0 09/29/2015 1216   AST 17 09/29/2015 1216   ALT 13 09/29/2015 1216   ALKPHOS 68 09/29/2015 1216   BILITOT 0.9 09/29/2015 1216   GFRNONAA 77 (L) 07/04/2013 1045   GFRAA 90 (L) 07/04/2013 1045   Lab Results  Component Value Date   CHOL 222 (H) 05/04/2015   HDL 77 05/04/2015   LDLCALC 121 05/04/2015   TRIG 121 05/04/2015   CHOLHDL 2.9 05/04/2015   No results found for: HGBA1C No results found for: VITAMINB12 Lab Results  Component Value Date   TSH 1.817 02/01/2015       ASSESSMENT AND PLAN  Essential tremor  Subacromial bursitis  Fall, initial encounter  1.   She will continue Xanax for tremor. If the tremor worsens, I will add a beta blocker. There is no evidence of Parkinson's disease as no bradykinesia, no cogwheeling and stable gait 2.   Stay active and exercise as tolerated. 3.   Inject right subacromial bursa with 40 mg Depo-Medrol in 2.5 cc Marcaine using sterile technique.    She tolerated the procedure well 4.   She will return to see me in 6 months or call sooner if there are new or worsening neurologic symptoms.   Ellyanna Holton A. Epimenio Foot, MD, PhD 09/05/2016, 2:56 PM Certified in Neurology, Clinical Neurophysiology, Sleep Medicine, Pain Medicine and Neuroimaging  [facility] [address] [place], [place] [address] [phone]

## 2016-09-07 DIAGNOSIS — R0789 Other chest pain: Secondary | ICD-10-CM | POA: Diagnosis not present

## 2016-09-12 ENCOUNTER — Ambulatory Visit (AMBULATORY_SURGERY_CENTER): Payer: Medicare Other | Admitting: Gastroenterology

## 2016-09-12 ENCOUNTER — Encounter: Payer: Self-pay | Admitting: Gastroenterology

## 2016-09-12 VITALS — BP 149/78 | HR 62 | Temp 97.1°F | Resp 18 | Ht 58.75 in | Wt 108.0 lb

## 2016-09-12 DIAGNOSIS — K228 Other specified diseases of esophagus: Secondary | ICD-10-CM | POA: Diagnosis not present

## 2016-09-12 DIAGNOSIS — K2970 Gastritis, unspecified, without bleeding: Secondary | ICD-10-CM

## 2016-09-12 DIAGNOSIS — R1013 Epigastric pain: Secondary | ICD-10-CM

## 2016-09-12 DIAGNOSIS — K30 Functional dyspepsia: Secondary | ICD-10-CM | POA: Diagnosis not present

## 2016-09-12 DIAGNOSIS — K2950 Unspecified chronic gastritis without bleeding: Secondary | ICD-10-CM | POA: Diagnosis not present

## 2016-09-12 DIAGNOSIS — B9681 Helicobacter pylori [H. pylori] as the cause of diseases classified elsewhere: Secondary | ICD-10-CM | POA: Diagnosis not present

## 2016-09-12 DIAGNOSIS — K2289 Other specified disease of esophagus: Secondary | ICD-10-CM

## 2016-09-12 MED ORDER — SODIUM CHLORIDE 0.9 % IV SOLN: 500.0000 mL | INTRAVENOUS | Status: DC

## 2016-09-12 NOTE — Progress Notes (Signed)
A/ox3 pleased with MAC, report to Sheila RN 

## 2016-09-12 NOTE — Progress Notes (Signed)
Dental advisory given to patient 

## 2016-09-12 NOTE — Op Note (Signed)
[facility] Patient Name: Tracy Malone Procedure Date: 09/12/2016 8:45 AM MRN: [id number] Endoscopist: Milus Banister , MD Age: 83 Referring MD:  Date of Birth: [birth year] Gender: Female Account #: [id number] Procedure:                Upper GI endoscopy Indications:              Dyspepsia, mild weight loss over past year;                            numerous GI tests since 2016; previous H. pylori +                            gastric biopsies, treated under supervision of ID                            with confirmation of eradication by stool testing Medicines:                Monitored Anesthesia Care Procedure:                Pre-Anesthesia Assessment:                           - Prior to the procedure, a History and Physical                            was performed, and patient medications and                            allergies were reviewed. The patient's tolerance of                            previous anesthesia was also reviewed. The risks                            and benefits of the procedure and the sedation                            options and risks were discussed with the patient.                            All questions were answered, and informed consent                            was obtained. Prior Anticoagulants: The patient has                            taken no previous anticoagulant or antiplatelet                            agents. ASA Grade Assessment: III - A patient with                            severe systemic disease. After reviewing the risks  and benefits, the patient was deemed in                            satisfactory condition to undergo the procedure.                           After obtaining informed consent, the endoscope was                            passed under direct vision. Throughout the                            procedure, the patient's blood pressure, pulse, and                            oxygen  saturations were monitored continuously. The                            Model GIF-[id number]) scope was introduced                            through the mouth, and advanced to the second part                            of duodenum. The upper GI endoscopy was                            accomplished without difficulty. The patient                            tolerated the procedure well. Scope In: Scope Out: Findings:                 The esophagus was normal.                           Diffuse mild inflammation characterized by erythema                            was found in the gastric antrum. Biopsies were                            taken with a cold forceps for histology.                           The examined duodenum was normal. Complications:            No immediate complications. Estimated blood loss:                            None. Estimated Blood Loss:     Estimated blood loss: none. Impression:               - Normal esophagus.                           - Mild,  non-specific gastritis. Biopsied.                           - Normal examined duodenum. Recommendation:           - Patient has a contact number available for                            emergencies. The signs and symptoms of potential                            delayed complications were discussed with the                            patient. Return to normal activities tomorrow.                            Written discharge instructions were provided to the                            patient.                           - Resume previous diet.                           - Continue present medications.                           - Await pathology results. Milus Banister, MD 09/12/2016 8:57:00 AM This report has been signed electronically.

## 2016-09-12 NOTE — Progress Notes (Signed)
No change since pre-visit.

## 2016-09-12 NOTE — Patient Instructions (Signed)
YOU HAD AN ENDOSCOPIC PROCEDURE TODAY AT THE [facility]:   Refer to the procedure report that was given to you for any specific questions about what was found during the examination.  If the procedure report does not answer your questions, please call your gastroenterologist to clarify.  If you requested that your care partner not be given the details of your procedure findings, then the procedure report has been included in a sealed envelope for you to review at your convenience later.  YOU SHOULD EXPECT: Some feelings of bloating in the abdomen. Passage of more gas than usual.  Walking can help get rid of the air that was put into your GI tract during the procedure and reduce the bloating. If you had a lower endoscopy (such as a colonoscopy or flexible sigmoidoscopy) you may notice spotting of blood in your stool or on the toilet paper. If you underwent a bowel prep for your procedure, you may not have a normal bowel movement for a few days.  Please Note:  You might notice some irritation and congestion in your nose or some drainage.  This is from the oxygen used during your procedure.  There is no need for concern and it should clear up in a day or so.  SYMPTOMS TO REPORT IMMEDIATELY:     Following upper endoscopy (EGD)  Vomiting of blood or coffee ground material  New chest pain or pain under the shoulder blades  Painful or persistently difficult swallowing  New shortness of breath  Fever of 100F or higher  Black, tarry-looking stools  For urgent or emergent issues, a gastroenterologist can be reached at any hour by calling [phone].   DIET:  We do recommend a small meal at first, but then you may proceed to your regular diet.  Drink plenty of fluids but you should avoid alcoholic beverages for 24 hours.  ACTIVITY:  You should plan to take it easy for the rest of today and you should NOT DRIVE or use heavy machinery until tomorrow (because of the sedation medicines  used during the test).    FOLLOW UP: Our staff will call the number listed on your records the next business day following your procedure to check on you and address any questions or concerns that you may have regarding the information given to you following your procedure. If we do not reach you, we will leave a message.  However, if you are feeling well and you are not experiencing any problems, there is no need to return our call.  We will assume that you have returned to your regular daily activities without incident.  If any biopsies were taken you will be contacted by phone or by letter within the next 1-3 weeks.  Please call us at [phone] if you have not heard about the biopsies in 3 weeks.    SIGNATURES/CONFIDENTIALITY: You and/or your care partner have signed paperwork which will be entered into your electronic medical record.  These signatures attest to the fact that that the information above on your After Visit Summary has been reviewed and is understood.  Full responsibility of the confidentiality of this discharge information lies with you and/or your care-partner.   Resume medications. Information given on Gastritis. 

## 2016-09-12 NOTE — Progress Notes (Signed)
Called to room to assist during endoscopic procedure.  Patient ID and intended procedure confirmed with present staff. Received instructions for my participation in the procedure from the performing physician.  

## 2016-09-13 ENCOUNTER — Telehealth: Payer: Self-pay | Admitting: *Deleted

## 2016-09-13 ENCOUNTER — Telehealth: Payer: Self-pay | Admitting: Gastroenterology

## 2016-09-13 NOTE — Telephone Encounter (Signed)
  Follow up Call-  Call back number 09/12/2016 12/09/2014  Post procedure Call Back phone  # [phone]  Permission to leave phone message Yes Yes  Some recent data might be hidden     Patient questions:  Do you have a fever, pain , or abdominal swelling? No. Pain Score  0 *  Have you tolerated food without any problems? No.  Have you been able to return to your normal activities? Yes.    Do you have any questions about your discharge instructions: Diet   No. Medications  No. Follow up visit  No.  Do you have questions or concerns about your Care? No.  Actions: * If pain score is 4 or above: No action needed, pain <4.

## 2016-09-13 NOTE — Telephone Encounter (Signed)
Per Up to date pt information the pt should use fiber supplements to bulk up the stool.    Fiber may also be helpful in some people with diarrhea-predominant symptoms since it can improve the consistency of stools.   A bulk-forming fiber supplement (such as psyllium or methylcellulose) may also be recommended to increase fiber intake since it is difficult to consume enough fiber in the diet. Fiber supplements should be started at a low dose and increased slowly over several weeks to reduce the symptoms of excessive intestinal gas, which can occur in some people when beginning fiber therapy.

## 2016-09-13 NOTE — Telephone Encounter (Signed)
Pt has been advised to try cirtucel and will call back if that does not help her symptoms

## 2016-09-13 NOTE — Telephone Encounter (Signed)
Pt states that she needs something to help for diarrhea not constipation

## 2016-09-14 DIAGNOSIS — F411 Generalized anxiety disorder: Secondary | ICD-10-CM | POA: Diagnosis not present

## 2016-09-15 ENCOUNTER — Telehealth: Payer: Self-pay | Admitting: Gastroenterology

## 2016-09-15 ENCOUNTER — Other Ambulatory Visit: Payer: Self-pay

## 2016-09-15 DIAGNOSIS — B9681 Helicobacter pylori [H. pylori] as the cause of diseases classified elsewhere: Secondary | ICD-10-CM

## 2016-09-15 DIAGNOSIS — K279 Peptic ulcer, site unspecified, unspecified as acute or chronic, without hemorrhage or perforation: Principal | ICD-10-CM

## 2016-09-15 DIAGNOSIS — Z881 Allergy status to other antibiotic agents status: Secondary | ICD-10-CM

## 2016-09-15 NOTE — Telephone Encounter (Signed)
The pt has been notified that there is no special diet for H pylori and only avoid foods if they bother her.

## 2016-09-18 ENCOUNTER — Ambulatory Visit (INDEPENDENT_AMBULATORY_CARE_PROVIDER_SITE_OTHER): Payer: Medicare Other | Admitting: Licensed Clinical Social Worker

## 2016-09-18 DIAGNOSIS — F3341 Major depressive disorder, recurrent, in partial remission: Secondary | ICD-10-CM

## 2016-09-20 ENCOUNTER — Other Ambulatory Visit (HOSPITAL_COMMUNITY): Payer: Medicare Other | Attending: Psychiatry | Admitting: Psychiatry

## 2016-09-20 DIAGNOSIS — F332 Major depressive disorder, recurrent severe without psychotic features: Secondary | ICD-10-CM

## 2016-09-20 NOTE — Progress Notes (Signed)
Follow up interview to [place] was helpful for depression and she still feels less depressed than she did and is glad she had the treatment, she says.  Anxiety has gotten worse as she has developed an H. Pylori infection and is allergic to the medication that worked in the past and has to take Prilosec which she hates.  She is going to try the Zoloft brand name as it helped th anxiety in the past but might make her closed angle glaucoma worse.  Worries all the the time that she will never get better and have to live this was till she dies

## 2016-09-25 ENCOUNTER — Telehealth: Payer: Self-pay

## 2016-09-25 NOTE — Telephone Encounter (Signed)
The pt has called ID and they will try and get her in sooner, if not she will call back.  She will also try gas x prior to eating to hopefully relieve some of the bloating

## 2016-09-26 ENCOUNTER — Ambulatory Visit (INDEPENDENT_AMBULATORY_CARE_PROVIDER_SITE_OTHER): Payer: Medicare Other | Admitting: Internal Medicine

## 2016-09-26 ENCOUNTER — Telehealth: Payer: Self-pay | Admitting: Allergy and Immunology

## 2016-09-26 VITALS — BP 132/75 | HR 68 | Temp 97.9°F | Ht 59.0 in | Wt 106.0 lb

## 2016-09-26 DIAGNOSIS — Z880 Allergy status to penicillin: Secondary | ICD-10-CM

## 2016-09-26 DIAGNOSIS — A048 Other specified bacterial intestinal infections: Secondary | ICD-10-CM

## 2016-09-26 MED ORDER — OMEPRAZOLE 20 MG PO CPDR: 20.0000 mg | DELAYED_RELEASE_CAPSULE | ORAL | 3 refills | Status: DC

## 2016-09-26 NOTE — Progress Notes (Signed)
RFV: recurrent h.pylori infection Patient ID: Tracy Malone, female   DOB: 1932/02/07, 83 y.o.   MRN: [id number]  HPI 83yo F with recurrent hpylori infection, previously treated in Summer 2016, but developed rash at the end of 10 day course thought to be due to clarithromycin. She has long list of intolerances. Previously had attempted to be placed on Pylera which gave her tongue and facial swelling. Unclear which specific agent, that caused the reaction. It contains tetracycline though she has tolerated doxycycline in the past, but had the GI intolerance SE.  Her last course of Hpylori treatment included: clarithromycin, metronidazole, and omeprazole. She did a 10 day course which included premedication for zofran for the GI side effects of clarithromycin. She tolerated it up until the last dose where she developed a diffuse maculopapular rash on 6/22. She went to the ED due to treat symptoms with benadryl  She states that her health had been doing ok up until Thanksgiving this past year, when she started to develop more abdominal fullness, epigastric discomfort and increase intestinal gas/occ flatulence  She was seen by Dr Edison Nasuti for evaluation. Her h.pylori stool antigen test was negative on 07/2016.  who performed repeat EGD on 2/20 which showed Diffuse mild inflammation characterized by erythema was found in the gastric antrum. Path showed WARTHIN-STARRY STAIN POSITIVE FOR HELICOBACTER PYLORI.  Due to her past difficulties with treatment, she was referred to our clinic for options for treatment  Currently taking mallox which she finds it is helping her. At the end of the day, she feels that she has accumulated significant gas in her abdomen, that is releaved somewhat with going to the bathroom and passing flatus. Allergies  Allergen Reactions  . Iodine Anaphylaxis  . Levsin [Hyoscyamine Sulfate]     Vision problems/pt has glaucoma  . Salmon [Fish Allergy] Hives and Shortness Of Breath    . Shellfish Allergy Anaphylaxis  . Remeron [Mirtazapine] Other (See Comments)    Cause blurred vision and red eyes, pt has glaucoma  . Aspirin Other (See Comments)    Sever stomach pain due to ulcer scaring.  . Ciprofloxacin Diarrhea  . Codeine Nausea And Vomiting  . Darvocet [Propoxyphene N-Acetaminophen] Nausea And Vomiting  . Demerol [Meperidine] Nausea Only  . Dexilant [Dexlansoprazole] Swelling  . Diphedryl [Diphenhydramine] Other (See Comments)    Increased pulse/small amount ok  . Doxycycline Hyclate Other (See Comments)    GI intolerance.  Marland Kitchen Epinephrine Other (See Comments)    Breathing problems  . Erythromycin Other (See Comments)    GI intolerance.  Yvette Rack [Cyclobenzaprine] Other (See Comments)    Tingly/prickly sensation.  Marland Kitchen Keflex [Cephalexin] Hives  . Latex Other (See Comments)    Gloves ok  . Prednisone Other (See Comments)    Headache  . Pylera [Bis Subcit-Metronid-Tetracyc] Swelling    Tongue swelling. Face tingling  . Sulfa Antibiotics Other (See Comments)    Increased pulse, fainting, diarrhea, thrush  . Xylocaine [Lidocaine Hcl]     With epinephrine, given by dentist.  Speeded up heart rate and she passed out (occured twice, at dentist)  . Zoloft [Sertraline Hcl] Swelling and Other (See Comments)    Migraine Swelling of tongue/lip (09/2012)  . Advil [Ibuprofen] Other (See Comments)    Motrin ok with a GI effect.  . Clarithromycin Rash    Started after completing 10 day course of 2000 mg /day  . Penicillins Hives and Rash     Outpatient Encounter Prescriptions as of 09/26/2016  Medication Sig  . ALPRAZolam (XANAX) 0.5 MG tablet Take 0.25 mg by mouth 2 (two) times daily as needed.   . ARTIFICIAL TEAR OP Apply 1-2 drops to eye 4 (four) times daily as needed (dry eyes).   Marland Kitchen b complex vitamins capsule Take 1 capsule by mouth as needed.   . Cholecalciferol (VITAMIN D) 2000 UNITS tablet Take 2,000 Units by mouth daily.  . Loperamide HCl (IMODIUM PO) Take  1 tablet by mouth as needed. Reported on 09/06/2015  . omeprazole (PRILOSEC) 20 MG capsule Take 1 capsule (20 mg total) by mouth every morning.  Marland Kitchen SYNTHROID 50 MCG tablet Take 50 mg by mouth every morning.  . vitamin E 400 UNIT capsule Take 400 Units by mouth every Monday, Wednesday, and Friday. Reported on 09/06/2015  . acetaminophen (TYLENOL) 650 MG CR tablet Take 650 mg by mouth as needed for pain. Reported on 09/06/2015  . [DISCONTINUED] Flaxseed, Linseed, (FLAX SEEDS PO) Take by mouth.  . [DISCONTINUED] levothyroxine (SYNTHROID) 25 MCG tablet Take 50 mcg by mouth daily before breakfast.   . [DISCONTINUED] 0.9 %  sodium chloride infusion    No facility-administered encounter medications on file as of 09/26/2016.      Patient Active Problem List   Diagnosis Date Noted  . Fall 09/05/2016  . Bloating 08/15/2016  . Severe recurrent major depression without psychotic features ([place]) 04/13/2016    Class: Chronic  . Rash and nonspecific skin eruption 03/01/2016  . Leg pain, bilateral 03/01/2016  . Functional dyspepsia 10/13/2015  . Essential tremor 03/02/2015  . Subacromial bursitis 03/02/2015  . Resting tremor 02/01/2015  . Epigastric fullness 10/12/2014  . Early satiety 10/12/2014  . Cystocele 12/30/2013  . IBS (irritable bowel syndrome) 09/30/2013  . GERD (gastroesophageal reflux disease) 06/27/2013  . PVC's (premature ventricular contractions) 10/02/2012  . Depressive disorder, not elsewhere classified 10/02/2012  . Bradycardia 08/01/2012  . Fatigue 08/01/2012  . Anxiety state 05/13/2012  . Osteopenia 05/13/2012  . Hypothyroidism 05/13/2012     Health Maintenance Due  Topic Date Due  . PNA vac Low Risk Adult (2 of 2 - PPSV23) 09/30/2014  . INFLUENZA VACCINE  02/22/2016    Social History  Substance Use Topics  . Smoking status: Never Smoker  . Smokeless tobacco: Never Used  . Alcohol use No   family history includes Diabetes in her maternal grandfather; Heart disease in  her brother and mother; Hypertension in her mother and sister; Lung cancer in her brother. Review of Systems Per hpi, roughly 5-10# weight loss since 4 months. Otherwise 10point ros is negative Physical Exam  BP 132/75   Pulse 68   Temp 97.9 F (36.6 C) (Oral)   Ht 4\' 11"  (1.499 m)   Wt 106 lb (48.1 kg)   BMI 21.41 kg/m  Physical Exam  Constitutional:  oriented to person, place, and time. appears well-developed and well-nourished. No distress.  HENT: [place]/AT, PERRLA, no scleral icterus Mouth/Throat: Oropharynx is clear and moist. No oropharyngeal exudate.  Cardiovascular: Normal rate, regular rhythm and normal heart sounds. Exam reveals no gallop and no friction rub.  No murmur heard.  Pulmonary/Chest: Effort normal and breath sounds normal. No respiratory distress.  has no wheezes.  Neck = supple, no nuchal rigidity Abdominal: Soft. Bowel sounds are normal.  exhibits no distension. There is no tenderness.   Psychiatric: a normal mood and affect.  behavior is normal.   CBC Lab Results  Component Value Date   WBC 11.8 (H) 09/29/2015   RBC  4.76 09/29/2015   HGB 15.3 (H) 12/17/2015   HCT 45.0 12/17/2015   PLT 268.0 09/29/2015   MCV 89.3 09/29/2015   MCH 29.8 02/01/2015   MCHC 33.5 09/29/2015   RDW 13.8 09/29/2015   LYMPHSABS 2.0 09/29/2015   MONOABS 1.0 09/29/2015   EOSABS 0.1 09/29/2015    BMET Lab Results  Component Value Date   NA 141 12/17/2015   K 4.8 12/17/2015   CL 105 12/17/2015   CO2 27 02/01/2015   GLUCOSE 103 (H) 12/17/2015   BUN 23 (H) 12/17/2015   CREATININE 0.70 12/17/2015   CALCIUM 9.3 02/01/2015   GFRNONAA 77 (L) 07/04/2013   GFRAA 90 (L) 07/04/2013      Assessment and Plan Recurrent h.pylori infection are often difficult to treat since they will include either macrolide or amoxicillin as part of the treatment core.  - I would like to refer Ms. Arcand to see allergist to determine which would be best to desensitive against either amoxicillin and  clarithro so that we can do a salvage therapy  Refill on omeprazole  Spent 45 min with patient counseling and reviewing options for hpylori treatment, reviewed her studies and her previous clinic notes/path study results

## 2016-09-26 NOTE — Telephone Encounter (Signed)
Dr. Storm Frisk office called and made a New Patient appointment for Tracy Malone 04/14/1932, MRN [id number]. Dr. Baxter Flattery would like to speak to you about this patient before she is seen here.  Her appointment is on April 11, at 9:00. Dr. Storm Frisk cell number is [phone].

## 2016-09-27 ENCOUNTER — Telehealth: Payer: Self-pay | Admitting: *Deleted

## 2016-09-27 NOTE — Telephone Encounter (Signed)
Verbal order per Dr Baxter Flattery to refer patient to Allergist for testing/desensitization.  RN called [place], appointment made with Dr Neldon Mc 4/11 at [facility] to refer patient anywhere else locally for this need.  Patient unable to drive to [facility] or [facility] for referral (she and her husband do not drive on major highways, would have to coordinate transportation with their daughter).   Tracy Malone would like to call Dr Bruna Potter office, request to be placed on a cancellation list.  RN will fax office visit and referral to Dr Bruna Potter office at [phone]. Landis Gandy, RN'

## 2016-10-02 ENCOUNTER — Encounter: Payer: Self-pay | Admitting: Gastroenterology

## 2016-10-03 ENCOUNTER — Ambulatory Visit: Payer: Medicare Other | Admitting: Licensed Clinical Social Worker

## 2016-10-04 ENCOUNTER — Ambulatory Visit: Payer: Self-pay | Admitting: Internal Medicine

## 2016-10-04 ENCOUNTER — Telehealth: Payer: Self-pay | Admitting: *Deleted

## 2016-10-04 NOTE — Telephone Encounter (Signed)
Patient husband called and advised his wife is very ill and needs to be seen sooner than 11/01/16 at allergy office. He would like for Dr Baxter Flattery to call them as they have questions and feels she can not wait. Advised him that was the earliest the office had but that they could call and try to get an earlier appointment. Advised will check with the doctor and someone will call him back.

## 2016-10-05 ENCOUNTER — Other Ambulatory Visit: Payer: Self-pay | Admitting: *Deleted

## 2016-10-05 ENCOUNTER — Ambulatory Visit (INDEPENDENT_AMBULATORY_CARE_PROVIDER_SITE_OTHER): Payer: Medicare Other | Admitting: Family Medicine

## 2016-10-05 VITALS — BP 118/68 | HR 64 | Temp 98.7°F | Ht <= 58 in | Wt 107.2 lb

## 2016-10-05 DIAGNOSIS — K2970 Gastritis, unspecified, without bleeding: Secondary | ICD-10-CM

## 2016-10-05 DIAGNOSIS — B9681 Helicobacter pylori [H. pylori] as the cause of diseases classified elsewhere: Secondary | ICD-10-CM | POA: Diagnosis not present

## 2016-10-05 DIAGNOSIS — J3489 Other specified disorders of nose and nasal sinuses: Secondary | ICD-10-CM

## 2016-10-05 DIAGNOSIS — F332 Major depressive disorder, recurrent severe without psychotic features: Secondary | ICD-10-CM | POA: Diagnosis not present

## 2016-10-05 MED ORDER — METRONIDAZOLE 500 MG PO TABS: 500.0000 mg | ORAL_TABLET | Freq: Three times a day (TID) | ORAL | 0 refills | Status: DC

## 2016-10-05 NOTE — Progress Notes (Signed)
Chief Complaint  Patient presents with  . Sinus Problem    having right sided sinus swelling. No facial pain. No drainage. No fevers. No coughing.   . Other    also has a question about probiotics.    Patient is re-establishing here, after switching providers for a time.  Briefly reviewed that I'm aware of recent health issues, including new diagnosis of recurrent H.pylori. She was sent to ID, who recommended allergy eval given her long history of allergies, to determine which antibiotic/treatment can be used.  She also tells me that she has had [facility] therapy for depression (completed in mid-December), and moods are significantly improved.    Her current complain is that for the last 2-3 weeks she has been waking up in the morning with right sided cheek pain. Pain improves later that morning, swelling goes down.  She saw the dentist last week--no problems that he saw in the upper jaw (most problems have been with the lower teeth that he has been treating). Denies any nasal drainage, postnasal drainage, cough, sore throat.  PMH, PSH, SH reviewed  Outpatient Encounter Prescriptions as of 10/05/2016  Medication Sig Note  . ALPRAZolam (XANAX) 0.5 MG tablet Take 0.25 mg by mouth 2 (two) times daily as needed.  05/04/2015: Taking 1/2 tablet in morning and noon, full tablet at bedtime  . b complex vitamins capsule Take 1 capsule by mouth as needed.  03/03/2015: Three times a week  . Cholecalciferol (VITAMIN D) 2000 UNITS tablet Take 2,000 Units by mouth daily. 05/04/2015: Taking daily  . omeprazole (PRILOSEC) 20 MG capsule Take 1 capsule (20 mg total) by mouth every morning.   . Probiotic Product (ALIGN PO) Take by mouth.   . SYNTHROID 50 MCG tablet Take 50 mg by mouth every morning.   . vitamin E 400 UNIT capsule Take 400 Units by mouth every Monday, Wednesday, and Friday. Reported on 09/06/2015   . acetaminophen (TYLENOL) 650 MG CR tablet Take 650 mg by mouth as needed for pain. Reported on 09/06/2015    . ARTIFICIAL TEAR OP Apply 1-2 drops to eye 4 (four) times daily as needed (dry eyes).    . Loperamide HCl (IMODIUM PO) Take 1 tablet by mouth as needed. Reported on 09/06/2015   . metroNIDAZOLE (FLAGYL) 500 MG tablet Take 1 tablet (500 mg total) by mouth 3 (three) times daily. (Patient not taking: Reported on 10/06/2016)   . [DISCONTINUED] hyoscyamine (LEVSIN, ANASPAZ) 0.125 MG tablet Take by mouth 3 (three) times daily as needed.   . [DISCONTINUED] lidocaine (LINDAMANTLE) 3 % CREA cream Apply 1 application to affected area as needed. Apply 5 inches to back at bedtime   . [DISCONTINUED] ZOLOFT 25 MG tablet Take 25 mg by mouth every morning.    No facility-administered encounter medications on file as of 10/05/2016.    Allergies  Allergen Reactions  . Iodine Anaphylaxis  . Levsin [Hyoscyamine Sulfate]     Vision problems/pt has glaucoma  . Salmon [Fish Allergy] Hives and Shortness Of Breath  . Shellfish Allergy Anaphylaxis  . Remeron [Mirtazapine] Other (See Comments)    Cause blurred vision and red eyes, pt has glaucoma  . Aspirin Other (See Comments)    Sever stomach pain due to ulcer scaring.  . Ciprofloxacin Diarrhea  . Codeine Nausea And Vomiting  . Darvocet [Propoxyphene N-Acetaminophen] Nausea And Vomiting  . Demerol [Meperidine] Nausea Only  . Dexilant [Dexlansoprazole] Swelling  . Diphedryl [Diphenhydramine] Other (See Comments)    Increased pulse/small amount  ok  . Doxycycline Hyclate Other (See Comments)    GI intolerance.  Marland Kitchen Epinephrine Other (See Comments)    Breathing problems  . Erythromycin Other (See Comments)    GI intolerance.  Yvette Rack [Cyclobenzaprine] Other (See Comments)    Tingly/prickly sensation.  Marland Kitchen Keflex [Cephalexin] Hives  . Latex Other (See Comments)    Gloves ok  . Prednisone Other (See Comments)    Headache  . Pylera [Bis Subcit-Metronid-Tetracyc] Swelling    Tongue swelling. Face tingling  . Sulfa Antibiotics Other (See Comments)     Increased pulse, fainting, diarrhea, thrush  . Xylocaine [Lidocaine Hcl]     With epinephrine, given by dentist.  Speeded up heart rate and she passed out (occured twice, at dentist)  . Zoloft [Sertraline Hcl] Swelling and Other (See Comments)    Migraine Swelling of tongue/lip (09/2012)  . Advil [Ibuprofen] Other (See Comments)    Motrin ok with a GI effect.  . Clarithromycin Rash    Started after completing 10 day course of 2000 mg /day  . Penicillins Hives and Rash   ROS: Denies sneezing, itchy eyes, runny nose, fever, cough, sore throat.  Denies chest pain, shortness of breath, nausea, vomiting, diarrhea.  She has epigastric pain and some decreased appetite.  No bleeding, bruising, rash or other complaints.  Depression is improved, as per HPI  PHYSICAL EXAM:  BP 118/68 (BP Location: Left Arm, Patient Position: Sitting, Cuff Size: Normal)   Pulse 64   Temp 98.7 F (37.1 C) (Tympanic)   Ht 4\' 10"  (1.473 m)   Wt 107 lb 3.2 oz (48.6 kg)   BMI 22.40 kg/m    Wt Readings from Last 3 Encounters:  10/05/16 107 lb 3.2 oz (48.6 kg)  09/26/16 106 lb (48.1 kg)  09/12/16 108 lb (49 kg)   Well appearing, pleasant female, accompanied by her husband, in no distress HEENT: PERRL, EOMI, conjunctiva and sclera are clear. Nasal mucosa notable for mild edema with clear mucus on the right, fairly clear on the left.  Sinuses are nontender.  OP is clear Neck: no lymphadenopathy, thyromegaly or mass Heart: regular rate and rhythm Lungs: clear bilaterally Back: no spinal or CVA tenderness Abdomen: mild diffuse tenderness across across entire upper abdomen. No rebound, guarding, organomegaly or mass Extremities: no edema Psych: normal mood, affect, hygiene and grooming Skin: normal turgor, no rash Neuro: alert and oriented, cranial nerves intact, normal gait  ASSESSMENT/PLAN:   Sinus pain - reassured no evidence of infection; supportive measures reviewed, and s/sx infection  Helicobacter  pylori gastritis - f/u as scheduled with allergist, to help ID determine best treatment  Severe recurrent major depression without psychotic features ([place]) - significantly improved s/p course of [place]. Continue routine f/u with psych    I see no evidence of a sinus infection.  I do see some clear mucus, which could indicate mild allergies.  If not bothersome you don't need to take any medications for it. If you have increased runny nose, sneezing, itchy eyes, start claritin or allegra daily. You can consider using nasal saline spray and/or sinus rinses to help alleviate any sinus pain.   Return if you have increasing pain (persists), discolored mucus, fever, cough, shortness of breath for re-evaluation.

## 2016-10-05 NOTE — Progress Notes (Signed)
I have an availability at 130 with Dr. Ernst Bowler in [place]. I called patient and left message for patient to call back.

## 2016-10-05 NOTE — Patient Instructions (Signed)
  I see no evidence of a sinus infection.  I do see some clear mucus, which could indicate mild allergies.  If not bothersome you don't need to take any medications for it. If you have increased runny nose, sneezing, itchy eyes, start claritin or allegra daily. You can consider using nasal saline spray and/or sinus rinses to help alleviate any sinus pain.   Return if you have increasing pain (persists), discolored mucus, fever, cough, shortness of breath for re-evaluation.

## 2016-10-06 ENCOUNTER — Ambulatory Visit (INDEPENDENT_AMBULATORY_CARE_PROVIDER_SITE_OTHER): Payer: Medicare Other | Admitting: Allergy & Immunology

## 2016-10-06 ENCOUNTER — Encounter: Payer: Self-pay | Admitting: Allergy & Immunology

## 2016-10-06 VITALS — BP 142/78 | HR 73 | Temp 97.9°F | Resp 16 | Ht 59.0 in | Wt 106.0 lb

## 2016-10-06 DIAGNOSIS — B9681 Helicobacter pylori [H. pylori] as the cause of diseases classified elsewhere: Secondary | ICD-10-CM | POA: Diagnosis not present

## 2016-10-06 DIAGNOSIS — Z881 Allergy status to other antibiotic agents status: Secondary | ICD-10-CM | POA: Diagnosis not present

## 2016-10-06 DIAGNOSIS — K253 Acute gastric ulcer without hemorrhage or perforation: Secondary | ICD-10-CM

## 2016-10-06 DIAGNOSIS — T7803XD Anaphylactic reaction due to other fish, subsequent encounter: Secondary | ICD-10-CM

## 2016-10-06 NOTE — Progress Notes (Signed)
Patient called back yesterday and advised that she was okay with this appointment. I gave her the address to [facility] and told her to go to [name] on computer and print off directions.

## 2016-10-06 NOTE — Progress Notes (Addendum)
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 Allergy to multiple antibiotics - Testing was negative today to penicillin, which does have excellent predictive value in her chances of passing an oral amoxicillin challenege. - Her history is low-risk, and it has been shown that patients with penicillin allergies lose their allergy in large numbers over time (upwards of 90% will lose their allergy over a ten year period).  - Therefore she is likely to tolerate the oral challenge. - I did offer to do the challenge today in clinic, however she was quite hesitant to this.  - We will have her come back in next week for an amoxicillin challenge in the office setting. - Unfortunately, we only have validated testing for penicillin because we know the metabolites of the drug that can cause reactions. - For the other antibiotics, we would have to address these case-by-case and either (1) perform a 10/90 challenge to the drug in clinic setting if it seems to be low risk or (2) perform an inpatient desensitization procedure which must be done in the ICU setting. - It should be noted that the reactions of late-onset drug rashes are likely T-cell mediated, and despite the irritating affect to the patient, they are rarely, if ever, life threatening.  - Therefore, the use of the drugs with late-onset rashes should be used following a risk-benefit analysis.  - We did have an abbreviated conversion about her other allergies, many of which are better classified as known side effects (i.e. diarrhea,  nausea, etc) rather than allergies.  - These reactions do not necessarily preclude the use of these antibiotics.  - However, Tracy Malone does not seem willing to even attempt to try these medications again.  - It should also be noted that the history of mouth sores with the TMP-SMX, any kind of challenge or desensitization is contraindicated since this brings up the possibility of Stevens-Johnson syndrome  2. Heliobacter pylori infection - I will talk to Dr. Baxter Flattery in the meantime and figure out a treatment plan.  - If she passes her amoxicillin test next Thursday, we should be able to use this to treat her Heliobacter. - However, I believe that Heliobacter requires a multiple antibiotic regimen in order to eradicate properly.  - This plan will be more clear once I touch base with Dr. Baxter Flattery.   3. Seafood allergy - It sounds from her story that she had negative testing to seafood. - She is not remotely interested in even trying seafood, therefore we will just leave that allergy on her extensive list. - I would like to send in an EpiPen but she was very hesitant to this secondary to her history of an "allergy" to epinephrine. - We will discuss again at her next appointment.   4. Return to clinic in six days for the amoxicillin challenge (Thursday March 22nd)    Subjective:   Tracy Malone is a 83 y.o. female presenting today for evaluation of  Chief Complaint  Patient presents with  . Allergic Reaction    antibiotics. most recent reaction was to clarithromycin with a rash and hives.   Marland Kitchen GI  Problem    is positive for h. pylori infection and is unable to take the antibiotics to treat it.     Tracy Malone has a history of the following: Patient Active Problem List   Diagnosis Date Noted  . Allergy to multiple antibiotics 10/07/2016  . Seafood allergy, anaphylaxis, subsequent encounter 10/07/2016  . Fall 09/05/2016  . Bloating 08/15/2016  . Severe recurrent major depression  without psychotic features ([place]) 04/13/2016    Class: Chronic  . Rash and nonspecific skin eruption 03/01/2016  . Leg pain, bilateral 03/01/2016  . Functional dyspepsia 10/13/2015  . Essential tremor 03/02/2015  . Subacromial bursitis 03/02/2015  . Resting tremor 02/01/2015  . Epigastric fullness 10/12/2014  . Early satiety 10/12/2014  . Cystocele 12/30/2013  . IBS (irritable bowel syndrome) 09/30/2013  . GERD (gastroesophageal reflux disease) 06/27/2013  . PVC's (premature ventricular contractions) 10/02/2012  . Depressive disorder, not elsewhere classified 10/02/2012  . Bradycardia 08/01/2012  . Fatigue 08/01/2012  . Anxiety state 05/13/2012  . Osteopenia 05/13/2012  . Hypothyroidism 05/13/2012    History obtained from: chart review and patient and her husband.  Tracy Malone was referred by Vikki Ports, MD.     Tracy Malone is a 83 y.o. female presenting for evaluation of antibiotic allergies. She was recently diagnosed with H pylori and needs to be treated for this. She had an endoscopy performed in February 20th. She had been feeling bad since Thanksgiving 2017. However, she has multiple antibiotic allergies and cannot tolerate the first line antibiotics for this infection.   She was diagnosed previously with H pylori two years ago in 2016. She was treated with clarithromycin and Flagyl ini. On the last day of her clarithromycin, she broke out in a rash and was seen in the ED. She was told that she was allergic to clarithromycin and Flagyl. Prior to this, she had taken Flagyl without a problem. She has not taken Flagyl since then around 40 years ago to treat what I believe must have been giardiasis. She does think that this was the first time that she had taken clarithromycin in 2016.   As mentioned above, her reaction included the development of a rash over her abdomen. She denies tongue and lip swelling. When she had the reaction, she called the pharmacist who recommended taking  benadryl. She then ended up in the ED on 01/14/15. She did not receive any medications at the ED. She did not take any additional Benadryl - only the 50mg  prior to coming to the ED. Per the ID note, she is interested in using either amoxicillin or clarithromycin as "salvage therapy". Tracy Malone does carry a diagnosis of penicillin allergy. However this history is rather vague. She has a history of penicillin allergy that was diagnosed decades ago. She estimates that this was over ten years ago. She developed rashes on the bottom of her feet. She did not require any intervention to treat this.   She has mulotiple other antibiotic allergies. Most are adverse reactions. She has them listed out on a sheet that she has brought with her today. She has them broken down into different categories:  "Serious Allergies" Iodine, shellfish, salmon: severe breathing problem and hives Cephalexin: hives and itching Latex: only from bandaids (gloves are no problem) Epinephrine, diamox, xylocaine: breathing problems and fainting Advil: hives on hands and feet with itching  Penicillin: as above (hives on hands and feet with itching) Pylera: headaches, tingling face, diarrhea TMP-SMX: oral thrush, GERD, Tongue swelling, painful  mouth ulcers  Clarithromycin: rash over entire body Dexilant: painful rash on feet Aspirin: stomach pain  "Bad Side Effects" Erythromycin: severe diarrhea Sulfites: diarrhea Demerol: nausea Codeine: vomiting Darvon/Darvocet: vomiting Diphenhydramine: rapid heartbeat Antiinflammatory medications: gastric problems Nexium and Prilosec: diarrhea (dose-dependent, as she tolerates 20mg  daily) [place]mith: burning and pain in stomach   She has a history of difficulty breathing with epinephrine. She first had this reaction at a dentist office and she received Xylocaine with epinephrine. She reported that she passed out. However, she has received lidocaine by itself without adverse event.  She  has anaphylaxis to shrimp and other shellfish. Apparently, she has undergone an evaluation for this in the past. It seems that she had negative skin testing and the physician wanted to give her fish in the office setting. However, she adamantly refused and stopped seeing this physician. In any case, she is fairly careful about where she eats and otherwise ask about whether shellfish is prepared in the same kitchen using the same facilities as the food she will be ingesting restaurant. She does not carry an EpiPen because of her history of an "epinephrine allergy".   Otherwise, there is no history of other atopic diseases, including asthma, stinging insect allergies, or urticaria. There is no significant infectious history. Vaccinations are up to date.    Past Medical History: Patient Active Problem List   Diagnosis Date Noted  . Allergy to multiple antibiotics 10/07/2016  . Seafood allergy, anaphylaxis, subsequent encounter 10/07/2016  . Fall 09/05/2016  . Bloating 08/15/2016  . Severe recurrent major depression without psychotic features ([place]) 04/13/2016    Class: Chronic  . Rash and nonspecific skin eruption 03/01/2016  . Leg pain, bilateral 03/01/2016  . Functional dyspepsia 10/13/2015  . Essential tremor 03/02/2015  . Subacromial bursitis 03/02/2015  . Resting tremor 02/01/2015  . Epigastric fullness 10/12/2014  . Early satiety 10/12/2014  . Cystocele 12/30/2013  . IBS (irritable bowel syndrome) 09/30/2013  . GERD (gastroesophageal reflux disease) 06/27/2013  . PVC's (premature ventricular contractions) 10/02/2012  . Depressive disorder, not elsewhere classified 10/02/2012  . Bradycardia 08/01/2012  . Fatigue 08/01/2012  . Anxiety state 05/13/2012  . Osteopenia 05/13/2012  . Hypothyroidism 05/13/2012    Medication List:  Allergies as of 10/06/2016      Reactions   Iodine Anaphylaxis   Levsin [hyoscyamine Sulfate]    Vision problems/pt has glaucoma   Salmon [fish Allergy]  Hives, Shortness Of Breath   Shellfish Allergy Anaphylaxis   Remeron [mirtazapine] Other (See Comments)   Cause blurred vision and red eyes, pt has glaucoma   Aspirin Other (See Comments)   Sever stomach pain due to ulcer scaring.   Ciprofloxacin Diarrhea   Codeine Nausea And Vomiting   Darvocet [propoxyphene N-acetaminophen] Nausea And Vomiting   Demerol [meperidine] Nausea Only   Dexilant [dexlansoprazole] Swelling   Diphedryl [diphenhydramine] Other (See Comments)   Increased pulse/small amount ok   Doxycycline Hyclate Other (See Comments)   GI intolerance.   Epinephrine Other (See Comments)   Breathing problems   Erythromycin Other (See Comments)   GI intolerance.   Flexeril [cyclobenzaprine] Other (See Comments)   Tingly/prickly sensation.   Keflex [cephalexin] Hives   Latex Other (See Comments)   Gloves ok   Prednisone Other (See Comments)   Headache   Pylera [bis Subcit-metronid-tetracyc] Swelling   Tongue swelling. Face tingling   Sulfa Antibiotics Other (See Comments)   Increased pulse, fainting, diarrhea, thrush   Xylocaine [lidocaine Hcl]    With epinephrine,  given by dentist.  Speeded up heart rate and she passed out (occured twice, at dentist)   Sharlee Blew Hcl] Swelling, Other (See Comments)   Migraine Swelling of tongue/lip (09/2012)   Advil [ibuprofen] Other (See Comments)   Motrin ok with a GI effect.   Clarithromycin Rash   Started after completing 10 day course of 2000 mg /day   Penicillins Hives, Rash      Medication List       Accurate as of 10/06/16 11:59 PM. Always use your most recent med list.          acetaminophen 650 MG CR tablet Commonly known as:  TYLENOL Take 650 mg by mouth as needed for pain. Reported on 09/06/2015   ALIGN Malone Take by mouth.   ALPRAZolam 0.5 MG tablet Commonly known as:  XANAX Take 0.25 mg by mouth 2 (two) times daily as needed.   ARTIFICIAL TEAR OP Apply 1-2 drops to eye 4 (four) times daily as needed  (dry eyes).   b complex vitamins capsule Take 1 capsule by mouth as needed.   IMODIUM Malone Take 1 tablet by mouth as needed. Reported on 09/06/2015   metroNIDAZOLE 500 MG tablet Commonly known as:  FLAGYL Take 1 tablet (500 mg total) by mouth 3 (three) times daily.   omeprazole 20 MG capsule Commonly known as:  PRILOSEC Take 1 capsule (20 mg total) by mouth every morning.   SYNTHROID 50 MCG tablet Generic drug:  levothyroxine Take 50 mg by mouth every morning.   Vitamin D 2000 units tablet Take 2,000 Units by mouth daily.   vitamin E 400 UNIT capsule Take 400 Units by mouth every Monday, Wednesday, and Friday. Reported on 09/06/2015       Birth History: non-contributory.    Developmental History: non-contributory.   Past Surgical History: Past Surgical History:  Procedure Laterality Date  . ABDOMINAL HYSTERECTOMY    . CATARACT EXTRACTION, BILATERAL  1995, 1996  . Flexible sigmoidoscopy    . THYROIDECTOMY, PARTIAL  09/2005   L nodule; Dr. Harlow Asa  . TONSILLECTOMY  1946  . UPPER GI ENDOSCOPY  06/27/12  . VAGINAL HYSTERECTOMY  1971   and bladder repair.  Still has ovaries     Family History: Family History  Problem Relation Age of Onset  . Heart disease Mother   . Hypertension Mother   . Hypertension Sister   . Heart disease Brother   . Lung cancer Brother     lung  . Diabetes Maternal Grandfather      Social History: Tracy Malone lives at home with her husband. They live in a 21yo home with carpeting throughout. They have gas heating and central cooling. There are no animals inside or outside of the home.     Review of Systems: a 14-point review of systems is pertinent for what is mentioned in HPI.  Otherwise, all other systems were negative. Constitutional: negative other than that listed in the HPI Eyes: negative other than that listed in the HPI Ears, nose, mouth, throat, and face: negative other than that listed in the HPI Respiratory: negative other than  that listed in the HPI Cardiovascular: negative other than that listed in the HPI Gastrointestinal: negative other than that listed in the HPI Genitourinary: negative other than that listed in the HPI Integument: negative other than that listed in the HPI Hematologic: negative other than that listed in the HPI Musculoskeletal: negative other than that listed in the HPI Neurological: negative other than that listed  in the HPI Allergy/Immunologic: negative other than that listed in the HPI    Objective:   Blood pressure (!) 142/78, pulse 73, temperature 97.9 F (36.6 C), temperature source Oral, resp. rate 16, height 4\' 11"  (1.499 m), weight 106 lb (48.1 kg), SpO2 95 %. Body mass index is 21.41 kg/m.   Physical Exam:  General: Alert, interactive, in no acute distress. Thin and emaciated. Eyes: No conjunctival injection present on the right, No conjunctival injection present on the left, PERRL bilaterally, No discharge on the right, No discharge on the left and No Horner-Trantas dots present. Sunken eyes.  Ears: Right TM pearly gray with normal light reflex, Left TM pearly gray with normal light reflex, Right TM intact without perforation and Left TM intact without perforation.  Nose/Throat: External nose within normal limits and septum midline, turbinates edematous and pale with clear discharge, post-pharynx erythematous without cobblestoning in the posterior oropharynx. Tonsils 2+ without exudates Neck: Supple without thyromegaly.  Adenopathy: no enlarged lymph nodes appreciated in the anterior cervical, occipital, axillary, epitrochlear, inguinal, or popliteal regions Lungs: Clear to auscultation without wheezing, rhonchi or rales. No increased work of breathing. CV: Normal S1/S2, no murmurs. Capillary refill <2 seconds.  Abdomen: Nondistended, nontender. No guarding or rebound tenderness. Bowel sounds present in all fields and hyperactive  Skin: Warm and dry, without lesions or  rashes. Extremities:  No clubbing, cyanosis or edema. Neuro:   Grossly intact. No focal deficits appreciated. Responsive to questions.  Diagnostic studies:    Allergy Studies:   Penicillin allergy testing (prick testing and intradermal testing): negative to all concentrations with a robust positive control     Salvatore Marvel, MD [facility] [place]

## 2016-10-06 NOTE — Patient Instructions (Addendum)
1. Allergy to multiple antibiotics - Testing was negative today, therefore she is likely to tolerate the oral challenge. - Come back next Thursday to the [facility] office for a amoxicillin challege. - I will talk to Dr. Baxter Flattery in the meantime and figure out a plan to treat the H pylori.  It was a pleasure to meet you today!

## 2016-10-07 ENCOUNTER — Encounter: Payer: Self-pay | Admitting: Family Medicine

## 2016-10-07 DIAGNOSIS — Z881 Allergy status to other antibiotic agents status: Secondary | ICD-10-CM | POA: Insufficient documentation

## 2016-10-07 DIAGNOSIS — K2970 Gastritis, unspecified, without bleeding: Secondary | ICD-10-CM

## 2016-10-07 DIAGNOSIS — T7803XD Anaphylactic reaction due to other fish, subsequent encounter: Secondary | ICD-10-CM | POA: Insufficient documentation

## 2016-10-07 DIAGNOSIS — B9681 Helicobacter pylori [H. pylori] as the cause of diseases classified elsewhere: Secondary | ICD-10-CM | POA: Insufficient documentation

## 2016-10-09 ENCOUNTER — Ambulatory Visit (INDEPENDENT_AMBULATORY_CARE_PROVIDER_SITE_OTHER): Payer: Medicare Other | Admitting: Licensed Clinical Social Worker

## 2016-10-09 DIAGNOSIS — F3341 Major depressive disorder, recurrent, in partial remission: Secondary | ICD-10-CM

## 2016-10-11 ENCOUNTER — Ambulatory Visit: Payer: Self-pay | Admitting: Gastroenterology

## 2016-10-12 ENCOUNTER — Ambulatory Visit (INDEPENDENT_AMBULATORY_CARE_PROVIDER_SITE_OTHER): Payer: Medicare Other | Admitting: Allergy & Immunology

## 2016-10-12 VITALS — BP 100/60 | HR 70 | Resp 14

## 2016-10-12 DIAGNOSIS — Z881 Allergy status to other antibiotic agents status: Secondary | ICD-10-CM | POA: Diagnosis not present

## 2016-10-12 MED ORDER — ESOMEPRAZOLE MAGNESIUM 40 MG PO CPDR: 40.0000 mg | DELAYED_RELEASE_CAPSULE | Freq: Two times a day (BID) | ORAL | 0 refills | Status: DC

## 2016-10-12 MED ORDER — LEVOFLOXACIN 500 MG PO TABS: 500.0000 mg | ORAL_TABLET | Freq: Every day | ORAL | 0 refills | Status: AC

## 2016-10-12 MED ORDER — AMOXICILLIN 500 MG PO CAPS: 1000.0000 mg | ORAL_CAPSULE | Freq: Two times a day (BID) | ORAL | 0 refills | Status: AC

## 2016-10-12 NOTE — Patient Instructions (Addendum)
1. Penicillin allergy - You tolerated a full dose of penicillin today, therefore you should be able to tolerate the amoxicillin for your Heliobacter infection. - I talked to Dr. Baxter Flattery and she recommended the following drug regimen:  - Amoxicillin 1000mg  twice daily for seven days (10/12/16 through 10/19/16)  - Nexium 40mg  twice daily for seven days (10/12/16 through 10/19/16)  - Levaquin 500mg  daily for seven days (10/20/16 through 10/27/16)  - Flagyl 500mg  three times daily for seven days (10/20/16 through 10/27/16) - Dr. Baxter Flattery will see you next week during your appointment.   2. Return as needed.   Please inform us of any Emergency Department visits, hospitalizations, or changes in symptoms. Call us before going to the ED for breathing or allergy symptoms since we might be able to fit you in for a sick visit. Feel free to contact us anytime with any questions, problems, or concerns.  It was a pleasure to see you and your family again today! Happy spring!   Websites that have reliable patient information: 1. American Academy of Asthma, Allergy, and Immunology: www.aaaai.org 2. Food Allergy Research and Education (FARE): foodallergy.org 3. Mothers of Asthmatics: http://www.asthmacommunitynetwork.org 4. American College of Allergy, Asthma, and Immunology: www.acaai.org

## 2016-10-12 NOTE — Progress Notes (Signed)
FOLLOW UP  Date of Service/Encounter:  10/12/16   Assessment:   Allergy to multiple antibiotics  Heliobacter pylori gastritis  Complex medical history    Plan/Recommendations:   1. Penicillin allergy - You tolerated a full dose of penicillin today, therefore you should be able to tolerate the amoxicillin for your Heliobacter infection. - I talked to Dr. Baxter Flattery and she recommended the following drug regimen:  - Amoxicillin 1000mg  twice daily for seven days (10/12/16 through 10/19/16)  - Nexium 40mg  twice daily for seven days (10/12/16 through 10/19/16)  - Levaquin 500mg  daily for seven days (10/20/16 through 10/27/16)  - Flagyl 500mg  three times daily for seven days (10/20/16 through 10/27/16) - Scripts sent in for the antibiotics.  - Dr. Baxter Flattery has a follow up appointment with Ms. Renderos early next week to discuss further.  - Ms. Weinel does not want to start the medication regimen until she talks to Dr. Baxter Flattery next week. - Since she has had the Heliobacter infection for a couple of months, I do not think that a few more days without treatment will hurt her.  2. Multiple antibiotic allergies versus adverse reactions - It should be noted that many of Ms. Register's reactions are actually known side effects.  - Therefore we could consider giving medications concurrently with the antibiotics that treat the side effects (i.e. Zofran to prevent nausea). - We could even consider putting Ms. Krohn on an antihistamine during her antibiotic regimens so that she can tolerate any non-serious rashes from the drugs.   3. Return as needed.       Subjective:   Tracy Malone is a 83 y.o. female presenting today for follow up of  Chief Complaint  Patient presents with  . Food/Drug Challenge    amoxicillin.     AHSLEY Malone has a history of the following: Patient Active Problem List   Diagnosis Date Noted  . Allergy to multiple antibiotics 10/07/2016  . Seafood allergy,  anaphylaxis, subsequent encounter 10/07/2016  . Helicobacter pylori gastritis 10/07/2016  . Fall 09/05/2016  . Bloating 08/15/2016  . Severe recurrent major depression without psychotic features ([place]) 04/13/2016    Class: Chronic  . Rash and nonspecific skin eruption 03/01/2016  . Leg pain, bilateral 03/01/2016  . Functional dyspepsia 10/13/2015  . Essential tremor 03/02/2015  . Subacromial bursitis 03/02/2015  . Resting tremor 02/01/2015  . Epigastric fullness 10/12/2014  . Early satiety 10/12/2014  . Cystocele 12/30/2013  . IBS (irritable bowel syndrome) 09/30/2013  . GERD (gastroesophageal reflux disease) 06/27/2013  . PVC's (premature ventricular contractions) 10/02/2012  . Depressive disorder, not elsewhere classified 10/02/2012  . Bradycardia 08/01/2012  . Fatigue 08/01/2012  . Anxiety state 05/13/2012  . Osteopenia 05/13/2012  . Hypothyroidism 05/13/2012    History obtained from: chart review and patient and her husband.  Tracy Malone was referred by Vikki Ports, MD.     Tracy Malone is a 83 y.o. female presenting for a drug challenge (amoxicillin). She has a complicated past medical history which is laid out in my note from last week. Essentially, she is requiring treatment for Helicobacter pylori gastritis. However she has had adverse reactions to clarithromycin and Flagyl in the past, both of which are included in one particular treatment regimen for Helio bacteria pylori. She has a history of a penicillin allergy, which is low risk including a rash only. We did do skin prick testing and intradermal's on Friday which were all negative. This does provide excellent negative predictive  value. She presents today for an amoxicillin challenge.  Since last visit, she has been stable. She continues to have decreased appetite and abdominal discomfort but this has not bee any worse. She denies any feelings of shortness of breath, swelling, or itching since the last visit. She is quite  nervous about the challenge today. She has not been on any antihistamines at this time.   Otherwise, there have been no changes to her past medical history, surgical history, family history, or social history.    Review of Systems: a 14-point review of systems is pertinent for what is mentioned in HPI.  Otherwise, all other systems were negative. Constitutional: negative other than that listed in the HPI Eyes: negative other than that listed in the HPI Ears, nose, mouth, throat, and face: negative other than that listed in the HPI Respiratory: negative other than that listed in the HPI Cardiovascular: negative other than that listed in the HPI Gastrointestinal: negative other than that listed in the HPI Genitourinary: negative other than that listed in the HPI Integument: negative other than that listed in the HPI Hematologic: negative other than that listed in the HPI Musculoskeletal: negative other than that listed in the HPI Neurological: negative other than that listed in the HPI Allergy/Immunologic: negative other than that listed in the HPI    Objective:   Blood pressure 100/60, pulse 70, resp. rate 14, SpO2 98 %. There is no height or weight on file to calculate BMI.   Physical Exam:  General: Alert, interactive, in no acute distress. Pleasant today. Less tearful than our last visit.  Eyes: No conjunctival injection present on the right, No conjunctival injection present on the left, PERRL bilaterally, No discharge on the right, No discharge on the left and No Horner-Trantas dots present Ears: Right TM pearly gray with normal light reflex, Left TM pearly gray with normal light reflex, Right TM intact without perforation and Left TM intact without perforation.  Nose/Throat: External nose within normal limits and septum midline, turbinates edematous without discharge, post-pharynx mildly erythematous without cobblestoning in the posterior oropharynx. Tonsils 2+ without  exudates Neck: Supple without thyromegaly. Lungs: Clear to auscultation without wheezing, rhonchi or rales. No increased work of breathing. CV: Normal S1/S2, no murmurs. Capillary refill <2 seconds.  Skin: Warm and dry, without lesions or rashes. Neuro:   Grossly intact. No focal deficits appreciated. Responsive to questions.   Diagnostic studies:    Open graded penicillin oral challenge: The patient was given multiple doses of penicillin in the clinic (see flow sheet scanned in for details): 5mg , 25mg , 50mg , and 450mg . The patient was observed and vitals were taken 30 minutes after each dose. She was watched for one hour after the last dose. At the end of three hours, she had tolerated a full dose of penicillin. There was some subjective tongue swelling after the last dose, otherwise there were no vital sign changes, physical exam changes, or any concerning symptoms appreciated. The patient was able to tolerate the challenge today without adverse signs or symptoms. Vital signs were stable throughout the challenge and observation period.   The patient had negative skin tests to penicillin and its determinants and was able to tolerate the open graded oral challenge today without adverse signs or symptoms. Therefore, she has the same risk of systemic reaction associated with the consumption of penicillin antibiotics as the general population.    Salvatore Marvel, MD [facility]

## 2016-10-16 ENCOUNTER — Telehealth: Payer: Self-pay

## 2016-10-16 NOTE — Telephone Encounter (Signed)
Patent called in requesting specific dosing in regards to the penicillin vk that she was given Thursday at her Drug Challenge. She was advised that she was given 450 mg/9 mL with the last dose. She stated that she did have some minor tongue swelling with that last dose. Patient's vital were reviewed in office prior to leaving and her vitals were within normal limits.Patient advised me Thursday that she was not going to start the amoxicillin until after her visit with Dr. Baxter Flattery because she was still concerned.   Patient is being seen tomorrow and she will speak with Dr. Baxter Flattery at that time in regards to the medication that she should take to treat the H. Pylori infection. Will await further information form patient in regards to medications.

## 2016-10-17 ENCOUNTER — Encounter: Payer: Self-pay | Admitting: Internal Medicine

## 2016-10-17 ENCOUNTER — Ambulatory Visit (INDEPENDENT_AMBULATORY_CARE_PROVIDER_SITE_OTHER): Payer: Medicare Other | Admitting: Internal Medicine

## 2016-10-17 VITALS — BP 125/71 | HR 76 | Temp 97.9°F | Wt 106.0 lb

## 2016-10-17 DIAGNOSIS — Z880 Allergy status to penicillin: Secondary | ICD-10-CM

## 2016-10-17 DIAGNOSIS — F064 Anxiety disorder due to known physiological condition: Secondary | ICD-10-CM | POA: Diagnosis not present

## 2016-10-17 DIAGNOSIS — A048 Other specified bacterial intestinal infections: Secondary | ICD-10-CM

## 2016-10-17 NOTE — Progress Notes (Signed)
RFV: recurrent hpylori infection  Patient ID: Tracy Malone, female   DOB: [birth year], 83 y.o.   MRN: [id number]  HPI  Tracy Malone isa an 83yo F with recurrent hpylori who underwent pcn  Desensitization by dr gallagher, allergist, last week with penicillin. The patient has both drug, seafood, iodine allergies as well as intolerances.  We have decided on the following regimen: Amoxicillin 1000mg  bid x 7 day nexium 40mg  bid x 7 days  Followed by Levofloxacin 500mg  daily x 7 days and metronidazole 500mg  TID x 7 days  She reported some tongue swelling roughly 12 min after the last dose of pcn thus she is concerned that she will get an allergic reaction. Thus she has not filled her prescriptions. After speaking with dr gallagher, his notes report that patient felt symptoms of tongue swelling at 40 min post last dose, though examination did not suggest any signs consistent with anaphylaxis  She is also still has poor appetite due to abdominal distention. Simethicone has not helped  Outpatient Encounter Prescriptions as of 10/17/2016  Medication Sig  . acetaminophen (TYLENOL) 650 MG CR tablet Take 650 mg by mouth as needed for pain. Reported on 09/06/2015  . ARTIFICIAL TEAR OP Apply 1-2 drops to eye 4 (four) times daily as needed (dry eyes).   Marland Kitchen b complex vitamins capsule Take 1 capsule by mouth as needed.   . Loperamide HCl (IMODIUM PO) Take 1 tablet by mouth as needed. Reported on 09/06/2015  . omeprazole (PRILOSEC) 20 MG capsule Take 1 capsule (20 mg total) by mouth every morning.  . Probiotic Product (ALIGN PO) Take by mouth.  . SYNTHROID 50 MCG tablet Take 50 mg by mouth every morning.  . vitamin E 400 UNIT capsule Take 400 Units by mouth every Monday, Wednesday, and Friday. Reported on 09/06/2015  . ALPRAZolam (XANAX) 0.5 MG tablet Take 0.25 mg by mouth 2 (two) times daily as needed.   Marland Kitchen amoxicillin (AMOXIL) 500 MG capsule Take 2 capsules (1,000 mg total) by mouth 2 (two) times daily.  (Patient not taking: Reported on 10/17/2016)  . Cholecalciferol (VITAMIN D) 2000 UNITS tablet Take 2,000 Units by mouth daily.  Marland Kitchen esomeprazole (NEXIUM) 40 MG capsule Take 1 capsule (40 mg total) by mouth 2 (two) times daily before a meal. (Patient not taking: Reported on 10/17/2016)  . levofloxacin (LEVAQUIN) 500 MG tablet Take 1 tablet (500 mg total) by mouth daily. (Patient not taking: Reported on 10/17/2016)  . metroNIDAZOLE (FLAGYL) 500 MG tablet Take 1 tablet (500 mg total) by mouth 3 (three) times daily. (Patient not taking: Reported on 10/06/2016)   No facility-administered encounter medications on file as of 10/17/2016.      Patient Active Problem List   Diagnosis Date Noted  . Allergy to multiple antibiotics 10/07/2016  . Seafood allergy, anaphylaxis, subsequent encounter 10/07/2016  . Helicobacter pylori gastritis 10/07/2016  . Fall 09/05/2016  . Bloating 08/15/2016  . Severe recurrent major depression without psychotic features ([place]) 04/13/2016    Class: Chronic  . Rash and nonspecific skin eruption 03/01/2016  . Leg pain, bilateral 03/01/2016  . Functional dyspepsia 10/13/2015  . Essential tremor 03/02/2015  . Subacromial bursitis 03/02/2015  . Resting tremor 02/01/2015  . Epigastric fullness 10/12/2014  . Early satiety 10/12/2014  . Cystocele 12/30/2013  . IBS (irritable bowel syndrome) 09/30/2013  . GERD (gastroesophageal reflux disease) 06/27/2013  . PVC's (premature ventricular contractions) 10/02/2012  . Depressive disorder, not elsewhere classified 10/02/2012  . Bradycardia 08/01/2012  . Fatigue  08/01/2012  . Anxiety state 05/13/2012  . Osteopenia 05/13/2012  . Hypothyroidism 05/13/2012     Health Maintenance Due  Topic Date Due  . PNA vac Low Risk Adult (2 of 2 - PPSV23) 09/30/2014     Review of Systems Loss of appetite, early satiety, abdominal fullness. Weight loss. Otherwise 10 point ros is negative Physical Exam   BP 125/71   Pulse 76   Temp 97.9 F  (36.6 C) (Oral)   Wt 106 lb (48.1 kg)   SpO2 100%   BMI 21.41 kg/m   Physical Exam  Constitutional:  oriented to person, place, and time. appears well-developed and well-nourished. No distress.  HENT: [place]/AT, PERRLA, no scleral icterus Mouth/Throat: Oropharynx is clear and moist. No oropharyngeal exudate.  Cardiovascular: Normal rate, regular rhythm and normal heart sounds. Exam reveals no gallop and no friction rub.  No murmur heard.  Pulmonary/Chest: Effort normal and breath sounds normal. No respiratory distress.  has no wheezes.  Abdominal: Soft. Bowel sounds are normal.  exhibits no distension. There is no tenderness.  Skin: Skin is warm and dry. No rash noted. No erythema.  Psychiatric:tearful, anxious  CBC Lab Results  Component Value Date   WBC 11.8 (H) 09/29/2015   RBC 4.76 09/29/2015   HGB 15.3 (H) 12/17/2015   HCT 45.0 12/17/2015   PLT 268.0 09/29/2015   MCV 89.3 09/29/2015   MCH 29.8 02/01/2015   MCHC 33.5 09/29/2015   RDW 13.8 09/29/2015   LYMPHSABS 2.0 09/29/2015   MONOABS 1.0 09/29/2015   EOSABS 0.1 09/29/2015    BMET Lab Results  Component Value Date   NA 141 12/17/2015   K 4.8 12/17/2015   CL 105 12/17/2015   CO2 27 02/01/2015   GLUCOSE 103 (H) 12/17/2015   BUN 23 (H) 12/17/2015   CREATININE 0.70 12/17/2015   CALCIUM 9.3 02/01/2015   GFRNONAA 77 (L) 07/04/2013   GFRAA 90 (L) 07/04/2013      Assessment and Plan  Abdominal discomfort due to hpylori vs. Bacterial overgrowth - will have her start course of metronidazole to see if it helps symptoms  Weight loss =Will have her supplement diet with protein   - will get her back to dr gallagher, either test with dose of amox vs. desensitizaiton to clarithro vs other alternative regimen that does not include any sensitivities taht she has had in the past  Memory disorder = during these last 2 visits, I suspect that she has mild memory disorder and has forgotten some of our discussion. Will make note  to ensure that prescriptions and discussion of taking meds are accurately understood.  Spent 45 min in counseling for antibiotic allergy and hpylori treatment  Ultimately, if unable to handle this regimen, may need to refer back to GI to help with symptoms.

## 2016-10-19 ENCOUNTER — Telehealth: Payer: Self-pay | Admitting: *Deleted

## 2016-10-19 ENCOUNTER — Encounter: Payer: Medicare Other | Admitting: Allergy and Immunology

## 2016-10-19 NOTE — Telephone Encounter (Signed)
Patient with specific concerns regarding antibiotics. 1- she is asking for explanation regarding why she was tested against penicillin rather than amoxicillin. 2- she wants to be sure that Dr. Baxter Flattery is aware that her tongue was swollen "in the back and along the sides" with her final dose of penicillin during testing. She does not think she is clear to take penicillin. 3- she started the flagyl yesterday. First dose gave her some tongue swelling. Second dose made her lips burn and tingle, with some swelling and redness.  She reports that she "looks like she is wearing lipstick but is not."  She stopped the antibiotic after 2 doses. 4- she states she was never advised about taking antihistamines to help with side effects of her antibiotics. 5- she is extremely concerned that she will never be able to tolerate antibiotics to clear her h pylori. Patient's call was routed to pharmacy. Landis Gandy, RN

## 2016-10-19 NOTE — Telephone Encounter (Signed)
I will call her to answer questions

## 2016-10-23 ENCOUNTER — Other Ambulatory Visit: Payer: Self-pay | Admitting: Internal Medicine

## 2016-10-23 MED ORDER — OMEPRAZOLE 40 MG PO CPDR: 40.0000 mg | DELAYED_RELEASE_CAPSULE | Freq: Two times a day (BID) | ORAL | 0 refills | Status: DC

## 2016-10-23 MED ORDER — NITAZOXANIDE 500 MG PO TABS: 500.0000 mg | ORAL_TABLET | Freq: Two times a day (BID) | ORAL | 0 refills | Status: DC

## 2016-10-23 MED ORDER — DOXYCYCLINE HYCLATE 100 MG PO TABS: 100.0000 mg | ORAL_TABLET | Freq: Two times a day (BID) | ORAL | 0 refills | Status: DC

## 2016-10-23 MED ORDER — ONDANSETRON 8 MG PO TBDP: 8.0000 mg | ORAL_TABLET | Freq: Three times a day (TID) | ORAL | 0 refills | Status: DC | PRN

## 2016-10-23 MED ORDER — LEVOFLOXACIN 500 MG PO TABS: 500.0000 mg | ORAL_TABLET | Freq: Every day | ORAL | 0 refills | Status: DC

## 2016-10-23 MED FILL — ONDANSETRON ODT 8 MG TABLET: 8 | 7 days supply | Qty: 20 | Fill #0

## 2016-10-23 MED FILL — *ALINIA 500 MG TABLET: 500 | 14 days supply | Qty: 28 | Fill #0

## 2016-10-23 MED FILL — DOXYCYCLINE HYCLATE 100 MG: 100 | 14 days supply | Qty: 28 | Fill #0

## 2016-10-23 MED FILL — levoFLOXacin 500 MG TABS: 500 | 14 days supply | Qty: 14 | Fill #0

## 2016-10-23 MED FILL — OMEPRAZOLE DR 40 MG CAPSULE: 40 | 14 days supply | Qty: 28 | Fill #0

## 2016-10-23 NOTE — Progress Notes (Signed)
Patient has had numerous intolerances and true allergies to several drug classes that it is making it challenging to retreat her recurrent hpylori  She is anxious after undergoing penicillin desensitization where she felt she had mild tongue swelling to her last dose though allergist did not feel it was significant.  Nonetheless, we will try a regimen that she has not had prior exposure to, and not known to have true abtx allergy  LOAD regimen for 14 days:  Levo 500mg  daily with breakfast Omeprazole 40mg  BID before meals Nitazoxanide 500mg  BID with meals Doxy 100mg  BID with meals  Also will give zofran to use as needed

## 2016-10-25 ENCOUNTER — Ambulatory Visit: Payer: Medicare Other | Admitting: Licensed Clinical Social Worker

## 2016-10-26 ENCOUNTER — Encounter: Payer: Self-pay | Admitting: Family Medicine

## 2016-10-26 ENCOUNTER — Ambulatory Visit (INDEPENDENT_AMBULATORY_CARE_PROVIDER_SITE_OTHER): Payer: Medicare Other | Admitting: Allergy & Immunology

## 2016-10-26 ENCOUNTER — Encounter: Payer: Self-pay | Admitting: Allergy & Immunology

## 2016-10-26 ENCOUNTER — Ambulatory Visit (INDEPENDENT_AMBULATORY_CARE_PROVIDER_SITE_OTHER): Payer: Medicare Other | Admitting: Family Medicine

## 2016-10-26 VITALS — BP 112/64 | HR 70 | Temp 97.5°F | Resp 16

## 2016-10-26 VITALS — BP 132/72 | HR 72 | Temp 98.5°F | Ht 59.0 in | Wt 107.0 lb

## 2016-10-26 DIAGNOSIS — K253 Acute gastric ulcer without hemorrhage or perforation: Secondary | ICD-10-CM

## 2016-10-26 DIAGNOSIS — N644 Mastodynia: Secondary | ICD-10-CM | POA: Diagnosis not present

## 2016-10-26 DIAGNOSIS — R0789 Other chest pain: Secondary | ICD-10-CM | POA: Diagnosis not present

## 2016-10-26 DIAGNOSIS — Z881 Allergy status to other antibiotic agents status: Secondary | ICD-10-CM | POA: Diagnosis not present

## 2016-10-26 DIAGNOSIS — B9681 Helicobacter pylori [H. pylori] as the cause of diseases classified elsewhere: Secondary | ICD-10-CM

## 2016-10-26 DIAGNOSIS — M797 Fibromyalgia: Secondary | ICD-10-CM | POA: Diagnosis not present

## 2016-10-26 NOTE — Patient Instructions (Addendum)
1. Acute gastric ulcer due to Helicobacter pylori - with multiple antibiotic allergies - You tolerated your entire dose today.  - Continue to take the antibiotic at home until gone.  Morning doses (with meal):  Levofloxacin in the morning with breakfast Omeprazole in the morning with breakfast [place] in the morning with breakfast Doxycycline in the morning with breakfast  Evening doses (with meal): Omeprazole in the evening with your evening meal Alinea in the evening with your evening meal Doxycycline in the evening with your evening meal  2. Return in about 1 year (around 10/26/2017) or as needed.  Please inform us of any Emergency Department visits, hospitalizations, or changes in symptoms. Call us before going to the ED for breathing or allergy symptoms since we might be able to fit you in for a sick visit. Feel free to contact us anytime with any questions, problems, or concerns.  It was a pleasure to see you and your family again today! Happy spring! I love the hats (including your beret, Mr. Vivia Ewing) and the bumblebee broach!   Websites that have reliable patient information: 1. American Academy of Asthma, Allergy, and Immunology: www.aaaai.org 2. Food Allergy Research and Education (FARE): foodallergy.org 3. Mothers of Asthmatics: http://www.asthmacommunitynetwork.org 4. American College of Allergy, Asthma, and Immunology: www.acaai.org

## 2016-10-26 NOTE — Progress Notes (Signed)
FOLLOW UP  Date of Service/Encounter:  10/26/16   Assessment:   Allergy to multiple antibiotics  Acute gastric ulcer due to Helicobacter pylori   Plan/Recommendations:   1. Acute gastric ulcer due to Helicobacter pylori - with multiple antibiotic allergies - Ms. Yankowski tolerated her entire dose today.  - Continue to take the antibiotic at home until gone.  Morning doses (with meal):  Levofloxacin in the morning with breakfast Omeprazole in the morning with breakfast Alinea in the morning with breakfast Doxycycline in the morning with breakfast  Evening doses (with meal): Omeprazole in the evening with your evening meal Alinea in the evening with your evening meal Doxycycline in the evening with your evening meal  2. Return in about 1 year (around 10/26/2017) or as needed.  Subjective:   MAEBEL MARASCO is a 83 y.o. female presenting today for follow up of  Chief Complaint  Patient presents with  . Food/Drug Challenge    Nitazoxanide    DONESHA WALLANDER has a history of the following: Patient Active Problem List   Diagnosis Date Noted  . Allergy to multiple antibiotics 10/07/2016  . Seafood allergy, anaphylaxis, subsequent encounter 10/07/2016  . Helicobacter pylori gastritis 10/07/2016  . Fall 09/05/2016  . Bloating 08/15/2016  . Severe recurrent major depression without psychotic features ([place]) 04/13/2016    Class: Chronic  . Rash and nonspecific skin eruption 03/01/2016  . Leg pain, bilateral 03/01/2016  . Functional dyspepsia 10/13/2015  . Essential tremor 03/02/2015  . Subacromial bursitis 03/02/2015  . Resting tremor 02/01/2015  . Epigastric fullness 10/12/2014  . Early satiety 10/12/2014  . Cystocele 12/30/2013  . IBS (irritable bowel syndrome) 09/30/2013  . GERD (gastroesophageal reflux disease) 06/27/2013  . PVC's (premature ventricular contractions) 10/02/2012  . Depressive disorder, not elsewhere classified 10/02/2012  . Bradycardia  08/01/2012  . Fatigue 08/01/2012  . Anxiety state 05/13/2012  . Osteopenia 05/13/2012  . Hypothyroidism 05/13/2012    History obtained from: chart review and patient.  [name] was referred by Vikki Ports, MD.     Johonna is a 82 y.o. female presenting for an antibiotic ingestion challenge. Fiza is a very complicated past medical history that is delineated in previous clinic visits. Briefly, she has a history of persistent H. pylori infection. Due to her multiple antibiotic allergies, we have been working closely with Dr. Carlyle Basques  to come up with an adequate treatment plan. We did do penicillin skin testing and gave her oral penicillin in clinic two weeks ago to prove that she can tolerate it. She did have some subjective tongue swelling within a few hours of the last dose, although she had no other problems with normal vital signs and physical exam. Since she passed penicillin challenge, we had planned to treat her with amoxicillin in combination with levofloxacin and metronidazole. However, she has refused taking amoxicillin. In consultation with Dr. Baxter Flattery, we we have decided to pursue treatment with the LOAD protocol (levofloxacin, omeprazole, Alinia, and doxycycline). She presents today for her first dose.  Since the last visit, she has remained stable. She did bring all of her medications into the office today. She has tolerated doxycycline in the past. She denies having ever had levofloxacin, but she has had this prescribed in the past. She has never had Alinia. She reports feeling well today aside from her baseline abdominal discomfort secondary to her heel electric pylori infection. She denies itching, shortness breath, or other systemic symptoms. She did bring in applesauce  to help her take the pills.  Otherwise, there have been no changes to her past medical history, surgical history, family history, or social history.    Review of Systems: a 14-point review of systems is  pertinent for what is mentioned in HPI.  Otherwise, all other systems were negative. Constitutional: negative other than that listed in the HPI Eyes: negative other than that listed in the HPI Ears, nose, mouth, throat, and face: negative other than that listed in the HPI Respiratory: negative other than that listed in the HPI Cardiovascular: negative other than that listed in the HPI Gastrointestinal: negative other than that listed in the HPI Genitourinary: negative other than that listed in the HPI Integument: negative other than that listed in the HPI Hematologic: negative other than that listed in the HPI Musculoskeletal: negative other than that listed in the HPI Neurological: negative other than that listed in the HPI Allergy/Immunologic: negative other than that listed in the HPI    Objective:   Blood pressure 112/64, pulse 70, temperature 97.5 F (36.4 C), temperature source Oral, resp. rate 16, SpO2 99 %. There is no height or weight on file to calculate BMI.   Physical Exam:  General: Alert, interactive, in no acute distress. Adorable thin appearing female.  Eyes: No conjunctival injection present on the right, No conjunctival injection present on the left, PERRL bilaterally, No discharge on the right, No discharge on the left and No Horner-Trantas dots present Ears: Right TM pearly gray with normal light reflex, Left TM pearly gray with normal light reflex, Right TM intact without perforation and Left TM intact without perforation.  Nose/Throat: External nose within normal limits and septum midline, turbinates minimally edematous without discharge, post-pharynx mildly erythematous without cobblestoning in the posterior oropharynx. Tonsils 2+ without exudates Neck: Supple without thyromegaly. Lungs: Clear to auscultation without wheezing, rhonchi or rales. No increased work of breathing. CV: Normal S1/S2, no murmurs. Capillary refill <2 seconds.  Skin: Warm and dry, without  lesions or rashes. Neuro:   Grossly intact. No focal deficits appreciated. Responsive to questions.   Diagnostic studies:    Open graded antibiotic (levofloxacin, omeprazole, Alinea, doxycycline) oral challenge: The patient was able to tolerate the challenge today without adverse signs or symptoms. Vital signs were stable throughout the challenge and observation period. Her physical exam remained normal before and during the procedure. She was kept for almost 2 hours following the ingestion.      Salvatore Marvel, MD [facility]

## 2016-10-26 NOTE — Patient Instructions (Signed)
  I suspect your pain is musculoskeletal, and not related to your breast. The fact that it radiates around to the back, and is tender to touch, makes me think it is related to the fibromyalgia. Use Tylenol Arthritis at bedtime, and as needed during the day. You can also try topical medications (if you have one with lidocatine, try that; biofreeze, zostrix, icy hot, etc).  Moist heat might also help (heating pad).  Contact us if increasing/worsening pain.  Next step would be x-rays of the ribs and chest. I suspect this is related to fibromyalgia, not a serious underlying problem, but if getting worse, please let us know.

## 2016-10-26 NOTE — Progress Notes (Signed)
Chief Complaint  Patient presents with  . Breast Pain    saw Dr.Grewal back in Feb for same pain she is having today. Had mammo 08/29/16 @ [facility]. Has not gotten any better or worse-same. She wonders if the pain is coming from her abdomen with her H Pylori? Pain radiates around to her back, hurts when she bends over.   . Cyst    also has a place on her right thigh that she would like you to take a look at as well.    She has pain in the left breast when she leans forward. The pain first started in January. The pain seems to radiate to the nipple.  To get into bed she needs to lift the breast--hurts in the entire breast.  Pain radiates up into to the lateral left chest wall, not quite up into the axilla. Hurts to sleep on the left side at night.  She took tylenol last night, which seemed to help some.  Hurts to take a deep breath.  Pain is usually contant.  She feels it with every bump in the road, feels it with a cough. Left breast feels different--feels swollen from the norm.    She saw Dr. Helane Rima in February for this complaint. Exam was normal, and sent her for a diagnostic mammogram. Mammogram result reviewed--2D diagnostic mammo done 08/29/16, normal, no significant change.  PMH, PSH, SH reviewed  Outpatient Encounter Prescriptions as of 10/26/2016  Medication Sig Note  . ALPRAZolam (XANAX) 0.5 MG tablet Take 0.25 mg by mouth 2 (two) times daily as needed.  05/04/2015: Taking 1/2 tablet in morning and noon, full tablet at bedtime  . b complex vitamins capsule Take 1 capsule by mouth as needed.  10/26/2016: daily  . Cholecalciferol (VITAMIN D) 2000 UNITS tablet Take 2,000 Units by mouth daily. 05/04/2015: Taking daily  . omeprazole (PRILOSEC) 40 MG capsule Take 1 capsule (40 mg total) by mouth 2 (two) times daily. Before meals   . Probiotic Product (ALIGN PO) Take by mouth.   . SYNTHROID 50 MCG tablet Take 50 mg by mouth every morning.   . vitamin E 400 UNIT capsule Take 400 Units by mouth every  Monday, Wednesday, and Friday. Reported on 09/06/2015   . acetaminophen (TYLENOL) 650 MG CR tablet Take 650 mg by mouth as needed for pain. Reported on 09/06/2015   . doxycycline (VIBRA-TABS) 100 MG tablet Take 1 tablet (100 mg total) by mouth 2 (two) times daily. With meals   . esomeprazole (NEXIUM) 40 MG capsule Take 1 capsule (40 mg total) by mouth 2 (two) times daily before a meal. (Patient not taking: Reported on 10/17/2016)   . levofloxacin (LEVAQUIN) 500 MG tablet Take 1 tablet (500 mg total) by mouth daily. With breakfast   . Loperamide HCl (IMODIUM PO) Take 1 tablet by mouth as needed. Reported on 09/06/2015   . nitazoxanide (ALINIA) 500 MG tablet Take 1 tablet (500 mg total) by mouth 2 (two) times daily with a meal. (Patient not taking: Reported on 10/26/2016)   . ondansetron (ZOFRAN-ODT) 8 MG disintegrating tablet Take 1 tablet (8 mg total) by mouth every 8 (eight) hours as needed for nausea or vomiting.   . [DISCONTINUED] ARTIFICIAL TEAR OP Apply 1-2 drops to eye 4 (four) times daily as needed (dry eyes).    . [DISCONTINUED] metroNIDAZOLE (FLAGYL) 500 MG tablet Take 1 tablet (500 mg total) by mouth 3 (three) times daily. (Patient not taking: Reported on 10/06/2016)    No  facility-administered encounter medications on file as of 10/26/2016.    Allergies  Allergen Reactions  . Iodine Anaphylaxis  . Levsin [Hyoscyamine Sulfate]     Vision problems/pt has glaucoma  . Salmon [Fish Allergy] Hives and Shortness Of Breath  . Shellfish Allergy Anaphylaxis  . Remeron [Mirtazapine] Other (See Comments)    Cause blurred vision and red eyes, pt has glaucoma  . Aspirin Other (See Comments)    Sever stomach pain due to ulcer scaring.  . Ciprofloxacin Diarrhea  . Codeine Nausea And Vomiting  . Darvocet [Propoxyphene N-Acetaminophen] Nausea And Vomiting  . Demerol [Meperidine] Nausea Only  . Dexilant [Dexlansoprazole] Swelling  . Diphedryl [Diphenhydramine] Other (See Comments)    Increased  pulse/small amount ok  . Doxycycline Hyclate Other (See Comments)    GI intolerance.  Marland Kitchen Epinephrine Other (See Comments)    Breathing problems  . Erythromycin Other (See Comments)    GI intolerance.  Yvette Rack [Cyclobenzaprine] Other (See Comments)    Tingly/prickly sensation.  Marland Kitchen Keflex [Cephalexin] Hives  . Latex Other (See Comments)    Gloves ok  . Prednisone Other (See Comments)    Headache  . Pylera [Bis Subcit-Metronid-Tetracyc] Swelling    Tongue swelling. Face tingling  . Sulfa Antibiotics Other (See Comments)    Increased pulse, fainting, diarrhea, thrush  . Xylocaine [Lidocaine Hcl]     With epinephrine, given by dentist.  Speeded up heart rate and she passed out (occured twice, at dentist)  . Zoloft [Sertraline Hcl] Swelling and Other (See Comments)    Migraine Swelling of tongue/lip (09/2012)  . Advil [Ibuprofen] Other (See Comments)    Motrin ok with a GI effect.  . Clarithromycin Rash    Started after completing 10 day course of 2000 mg /day   ROS:  No fever chills, URI symptoms. +abdominal pain, bloating--plans for H pylori treatment to start later today. No vomiting or diarrhea.  No nipple discharge, lymphadenopathy or other concerns today.  PHYSICAL EXAM:  BP 132/72 (BP Location: Left Arm, Patient Position: Sitting, Cuff Size: Normal)   Pulse 72   Temp 98.5 F (36.9 C) (Tympanic)   Ht 4\' 11"  (1.499 m)   Wt 107 lb (48.5 kg)   BMI 21.61 kg/m   Pleasant, thin, elderly female in mild-mod discomfort with movements and exam Left breast: normal appearance--no swelling, redness, skin dimpling, nipple discharge or inversion. Very sensitive in the superficial left breast.  No masses or abnormalities in breast exam. She was much more tender to deeper palpation and lateral to the breast, over the rib and intercostal area.  Slightly tender in medial upper breast/chest wall, but not at costochondral junction (medial to this area). nontender posteriorly at spine and  ribs. Mild epigastric tenderness Heart: regular rate and rhythm Lungs: clear   ASSESSMENT/PLAN:   Chest wall pain - suspect related to fibromyalgia.  Ddx include pleurisy vs other underlying problem. Rec rib films/CXR if worsening.  Fibromyalgia  Breast pain, left - doesn't appear to be related to the breast, more likely the underlying tissue and fibromyalgia. normal mammo recently    I suspect your pain is musculoskeletal, and not related to your breast. The fact that it radiates around to the back, and is tender to touch, makes me think it is related to the fibromyalgia. Use Tylenol Arthritis at bedtime, and as needed during the day. You can also try topical medications (if you have one with lidocatine, try that; biofreeze, zostrix, icy hot, etc).  Moist heat might  also help (heating pad).  Contact us if increasing/worsening pain.  Next step would be x-rays of the ribs and chest. I suspect this is related to fibromyalgia, not a serious underlying problem, but if getting worse, please let us know.

## 2016-11-01 ENCOUNTER — Ambulatory Visit: Payer: Medicare Other | Admitting: Allergy and Immunology

## 2016-11-02 ENCOUNTER — Telehealth: Payer: Self-pay | Admitting: Internal Medicine

## 2016-11-02 NOTE — Telephone Encounter (Signed)
Tracy Malone reported feeling horrible in taking her medications for hpylori. She noticed having diarrhea, increasing bloating, possibly reflux radiating toward her sternum. I asked her to stop her regimen since it appears that we would not be able to carry her through the remaining 7 days of treatment.  I mentioned that I would reach out to dr Ardis Hughs to see if she can be seen by them to evaluate her symptoms in order to decide to retry again.

## 2016-11-03 ENCOUNTER — Telehealth: Payer: Self-pay

## 2016-11-03 NOTE — Telephone Encounter (Signed)
Pt has been scheduled to see Dr Ardis Hughs for next week.  Pt aware

## 2016-11-03 NOTE — Telephone Encounter (Signed)
-----   Message from Milus Banister, MD sent at 11/03/2016  4:48 AM EDT ----- Thanks for the information. We'll get her in.  Enjoy your trip!   Katina Degree, She needs rov with me next week, please double book if needed.  Thanks   ----- Message ----- From: Carlyle Basques, MD Sent: 11/02/2016  10:16 PM To: Milus Banister, MD  Hey dan, I have been working (unsuccessfully)  to find a regimen to treat her recurrent h.pylori. She only managed to do a 7 of the 14 day course where she had significant symptoms/SE. I had asked her to stop taking her meds on 4/11 since she appeared miserable.   Any chance you or your PA can see her to see if you can provide any symptomatic relief to her constellation of symptoms.   Since you referred her back to me. I sent her to an allergist to do PCN testing to try to do an amox based regimen. Though the allergist doesn't believe she had any reaction, the patient felt she had some tongue swelling at the last dose.  I opted for a levofloxacin, doxycycline, omeprazole  BID dosing combination but I think she states that he BID dosing of omep caused her diarrhea. And she had reflux vs. Pill esophagitis from the doxy but not nausea. In retrospect, I might have tried to do the lower dose.  I am willing to take another crack at it to find a different regimen but wondering if her symptoms are all hpylori or possibly something else that could be fine tuned. Granted, she does have high level of anxiety that also plays into this all.  Sorry that it wasn't as easy as the first time. I am headed out of town til the 25th but if someone in your office could see her, that would be very useful.

## 2016-11-07 ENCOUNTER — Ambulatory Visit (INDEPENDENT_AMBULATORY_CARE_PROVIDER_SITE_OTHER): Payer: Medicare Other | Admitting: Gastroenterology

## 2016-11-07 ENCOUNTER — Encounter: Payer: Self-pay | Admitting: Gastroenterology

## 2016-11-07 VITALS — BP 132/72 | HR 60 | Ht 59.0 in | Wt 106.2 lb

## 2016-11-07 DIAGNOSIS — R140 Abdominal distension (gaseous): Secondary | ICD-10-CM

## 2016-11-07 DIAGNOSIS — R1011 Right upper quadrant pain: Secondary | ICD-10-CM

## 2016-11-07 DIAGNOSIS — Z8619 Personal history of other infectious and parasitic diseases: Secondary | ICD-10-CM

## 2016-11-07 NOTE — Progress Notes (Signed)
Review of previous GI testing; Dr. Benson Norway and changed to Dr. Ardis Hughs 10/2014:  -CBC, CMP, and TSH were normal in 07/2014 and again in 08/2014. CT scan02/26/2016.This was done without contrast due to dye allergy.This showed no acute findings in the abdomen and pelvis specifically to explain her history of abdominal pain. There is a 3.0 cm left ovarian cystic lesion that is likely benign. As I stated above, she is following with gynecology and they're going to monitor that, but they think that it is benign.Stomach, duodenum, small bowel, and colon grossly unremarkable.Abdominal ultrasound on 02/12/2016showed no acute or chronic abnormalities of the hepatobiliary tree; evaluation of the pancreatic tail was limited. There is no acute abnormalities of the kidney demonstrated. A 1.6 cm diameter mid to lower pole cyst was noted in the right kidney. August 2015 HIDA scan with [facility], which was normal.This was performed for complaints of upper abdominal pain, appetite loss, fatigue, and weight loss at that time as well.EGD was in December 2013at which time she was found to have moderate gastritis. Biopsies actually showed H. Pylori with chronic active gastritis, however, after taking 1 dose of the Pylera medication she discontinued it because of side effects (it made her feel horrible).Therefore, she has not completed treatment for H. Pylori. Duodenal biopsies were unremarkable at that time.flexible sigmoidoscopy also in December 2013that was normal with only medium hemorrhoids noted. This was performed for diarrhea and biopsies of the descending and sigmoid colon were unremarkable. She states that she had a full colonoscopy prior to that with Dr. Benson Norway, however, after calling his office they stated they have never performed a full colonoscopy on her. Gastric emptying scan in January 2014showed 33% of the ingested activity remaining in the stomach which is mildly abnormal compared to the normal range of less than 30%  of the ingested activity remaining at 120 minutes.Repeat GES 3/2016was normal. EGD 11/2014 with Dr. Ardis Hughs showed pan-gastritis withpositive Hpylori on biopsies; also small hiatal hernia. Saw ID and was apparently treated with subsequent testing negative. Repeat abdominal ultrasound 09/2015 was normal.  07/2016 H. Pylori stool antigen was negative. Repeat EGD 08/2016: mild gastritis, + for H pylori again. Referred to ID and tried a regimen but could not complete it due to   HPI: This is a pleasant 83 year old woman who is here with her husband and daughter.  Chief complaint is recent intolerance of antibiotics for H. pylori treatment.  Completed 7 days of ID recommended antibiotics: then she had abdominal cramping.  She stopped the antibiotics his also stopped proton pump inhibitor. She has felt slightly better since stopping all of those.  Her fingers have been peeling.  Her bloating is improved but certainly not gone. She is still bothered by early satiety, bloating after she eats.   ROS: complete GI ROS as described in HPI.  Constitutional:  No unintentional weight loss   Past Medical History:  Diagnosis Date  . Bell's palsy 1966   ? right side facial droop  . Carotid artery disease ([place]) 2010   on vascular screening;unchanged 2013.(could not tolerate simvastatin, no other statins tried)--<30% blockage bilat 07/2011  . Chronic abdominal pain   . Chronic fatigue and malaise   . Claustrophobia   . Depression    treated in the past for years;stopped in 2010 for a years  . Duodenal ulcer 1962   h/o  . Fibromyalgia   . 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 GERD (gastroesophageal reflux disease)   .  Glaucoma, narrow-angle    s/p laser surgery  . Hyperthyroidism    h/o x 2 years  . Hypothyroid 9/08  . IBS (irritable bowel syndrome)    Dr. Benson Norway  . Ocular migraine   . Osteoporosis 10/11   Dr.Hawkes  . Panic attack   . Recurrent UTI     has cystocele-Dr.Grewal  . Shingles 1999   h/o  . SLE (systemic lupus erythematosus) (H[facility])    with arthralgia/myalgia, positive ANA centromere pattern, positive DNA, positive RNP, indeterminant anti-cardiolipin antibody-Dr.Hawkes.  . Superficial thrombophlebitis 03/2009   RLE  . Trochanteric bursitis 12/2008   bilateral    Past Surgical History:  Procedure Laterality Date  . ABDOMINAL HYSTERECTOMY    . CATARACT EXTRACTION, BILATERAL  1995, 1996  . Flexible sigmoidoscopy    . THYROIDECTOMY, PARTIAL  09/2005   L nodule; Dr. Harlow Asa  . TONSILLECTOMY  1946  . UPPER GI ENDOSCOPY  06/27/12  . VAGINAL HYSTERECTOMY  1971   and bladder repair.  Still has ovaries    Current Outpatient Prescriptions  Medication Sig Dispense Refill  . acetaminophen (TYLENOL) 650 MG CR tablet Take 650 mg by mouth as needed for pain. Reported on 09/06/2015    . ALPRAZolam (XANAX) 0.5 MG tablet Take 0.25 mg by mouth 2 (two) times daily as needed.     Marland Kitchen b complex vitamins capsule Take 1 capsule by mouth as needed.     . Cholecalciferol (VITAMIN D) 2000 UNITS tablet Take 2,000 Units by mouth daily.    . Loperamide HCl (IMODIUM PO) Take 1 tablet by mouth as needed. Reported on 09/06/2015    . Probiotic Product (ALIGN PO) Take by mouth.    . SYNTHROID 50 MCG tablet Take 50 mg by mouth every morning.  4  . vitamin E 400 UNIT capsule Take 400 Units by mouth every Monday, Wednesday, and Friday. Reported on 09/06/2015     No current facility-administered medications for this visit.     Allergies as of 11/07/2016 - Review Complete 11/07/2016  Allergen Reaction Noted  . Iodine Anaphylaxis 05/13/2012  . Levsin [hyoscyamine sulfate]  10/13/2015  . Salmon [fish allergy] Hives and Shortness Of Breath 05/13/2012  . Shellfish allergy Anaphylaxis 05/13/2012  . Remeron [mirtazapine] Other (See Comments) 08/25/2016  . Aspirin Other (See Comments) 05/13/2012  . Ciprofloxacin Diarrhea 05/13/2012  . Codeine Nausea And Vomiting  05/13/2012  . Darvocet [propoxyphene n-acetaminophen] Nausea And Vomiting 05/13/2012  . Demerol [meperidine] Nausea Only 05/13/2012  . Dexilant [dexlansoprazole] Swelling 03/01/2016  . Diphedryl [diphenhydramine] Other (See Comments) 05/13/2012  . Doxycycline hyclate Other (See Comments) 05/13/2012  . Epinephrine Other (See Comments) 05/13/2012  . Erythromycin Other (See Comments) 05/13/2012  . Flexeril [cyclobenzaprine] Other (See Comments) 05/13/2012  . Keflex [cephalexin] Hives 05/13/2012  . Latex Other (See Comments) 05/13/2012  . Prednisone Other (See Comments) 05/13/2012  . Pylera [bis subcit-metronid-tetracyc] Swelling 08/01/2012  . Sulfa antibiotics Other (See Comments) 05/13/2012  . Xylocaine [lidocaine hcl]  05/13/2012  . Zoloft [sertraline hcl] Swelling and Other (See Comments) 05/13/2012  . Advil [ibuprofen] Other (See Comments) 05/13/2012  . Clarithromycin Rash 01/14/2015    Family History  Problem Relation Age of Onset  . Heart disease Mother   . Hypertension Mother   . Hypertension Sister   . Heart disease Brother   . Lung cancer Brother     lung  . Diabetes Maternal Grandfather     Social History   Social History  . Marital status: Married  Spouse name: N/A  . Number of children: 2  . Years of education: N/A   Occupational History  . retired (school system)    Social History Main Topics  . Smoking status: Never Smoker  . Smokeless tobacco: Never Used  . Alcohol use No  . Drug use: No  . Sexual activity: No   Other Topics Concern  . Not on file   Social History Narrative   Married.  Son lives in [place]; Daughter [name] lives in [place]; 2 grandchildren     Physical Exam: BP 132/72   Pulse 60   Ht 4\' 11"  (1.499 m)   Wt 106 lb 3.2 oz (48.2 kg)   BMI 21.45 kg/m  Constitutional: Frail, elderly woman Psychiatric: alert and oriented x3 Abdomen: soft, nontender, nondistended, no obvious ascites, no peritoneal signs, normal bowel sounds No  peripheral edema noted in lower extremities  Assessment and plan: 83 y.o. female with recent biopsies of H. pylori positive gastritis, intolerance of multiple medicines including multiple antibiotics  She completed a seven-day course of antibiotics for H. pylori gastritis proven by biopsy. She has had H. pylori biopsies proven 3 times over the past for 5 years. I'm not sure I can contribute all of her symptoms to the H. pylori. I also find anything that stool antigen testing was done about one month before her EGD and it was negative for H. pylori. Perhaps it was because she was on proton pump inhibitor. She is off of proton pump inhibitor now for about a week and then to continue her to hold it and she'll recheck stool antigen H. pylori in 3 or 4 weeks. I like to reimage her abdomen, she has iodine allergy and so CT scan is not possible. I'm getting an abdominal ultrasound to make sure were not missing anything to large obvious tumor in her abdomen. I don't think that is the case. I'm most struck by the fact that she has allergies to 23 or 24 medicines, she has been complaining of GI issues for 68 years, she has undergone 10-12 tests for without any clear explanation except for this intermittently positive H. pylori. I think a lot of her symptoms may be functional, anxiety related.  Please see the "Patient Instructions" section for addition details about the plan.  Owens Loffler, MD [facility] 11/07/2016, 2:19 PM

## 2016-11-07 NOTE — Patient Instructions (Addendum)
You will have labs checked today in the basement lab.  Please head down after you check out with the front desk  (h. Pylori stool antigen in 2-3 weeks).  Abdominal US for epigastric/RUQ abdominal pains.   You have been scheduled for an abdominal ultrasound at [facility] (1st floor of hospital) on 11/09/16 at 930 am. Please arrive 15 minutes prior to your appointment for registration. Make certain not to have anything to eat or drink 6 hours prior to your appointment. Should you need to reschedule your appointment, please contact radiology at [phone]. This test typically takes about 30 minutes to perform.  Slowly advance your diet, whatever foods appeal to you.

## 2016-11-08 ENCOUNTER — Telehealth: Payer: Self-pay | Admitting: Family Medicine

## 2016-11-08 ENCOUNTER — Encounter: Payer: Self-pay | Admitting: Family Medicine

## 2016-11-08 ENCOUNTER — Telehealth: Payer: Self-pay

## 2016-11-08 DIAGNOSIS — R5382 Chronic fatigue, unspecified: Secondary | ICD-10-CM

## 2016-11-08 DIAGNOSIS — Z5181 Encounter for therapeutic drug level monitoring: Secondary | ICD-10-CM

## 2016-11-08 DIAGNOSIS — R0789 Other chest pain: Secondary | ICD-10-CM

## 2016-11-08 DIAGNOSIS — E785 Hyperlipidemia, unspecified: Secondary | ICD-10-CM

## 2016-11-08 DIAGNOSIS — Z13220 Encounter for screening for lipoid disorders: Secondary | ICD-10-CM

## 2016-11-08 NOTE — Telephone Encounter (Signed)
Did she complete antibiotic course? I believe this was for Helicobacter pylori

## 2016-11-08 NOTE — Telephone Encounter (Signed)
Fingers starting peeling after stopping antibiotics. Started with index finger on right hand and then both hands are peeling . No irritation present.  Tips are wrinkling and rough.   She was taking  : doxycyline, Levaquin and alinia for 7 days.   Patient thinks this is related to antibiotics.  Please advise.

## 2016-11-08 NOTE — Telephone Encounter (Signed)
Pt made Medicare Wellness Visit appointment for 05/10/17. She would like to come in on 05/07/17 for fasting labs prior to visit. Is this ok?  Pt said she continues to have arm-breast area pain and pain between her shoulder blades. Breast pain was so bad last night that she had to lift her breast to get into bed. GI doctor will be doing a [place] of her abdomen. Pt said Dr Tomi Bamberger mentioned that she may need a chest & rib xray.

## 2016-11-09 ENCOUNTER — Other Ambulatory Visit: Payer: Medicare Other

## 2016-11-09 ENCOUNTER — Ambulatory Visit (HOSPITAL_COMMUNITY)
Admission: RE | Admit: 2016-11-09 | Discharge: 2016-11-09 | Disposition: A | Discharge status: Home / Self Care | Payer: Medicare Other | Source: Ambulatory Visit | Attending: Gastroenterology | Admitting: Gastroenterology

## 2016-11-09 DIAGNOSIS — R1011 Right upper quadrant pain: Secondary | ICD-10-CM | POA: Insufficient documentation

## 2016-11-09 DIAGNOSIS — Z8619 Personal history of other infectious and parasitic diseases: Secondary | ICD-10-CM

## 2016-11-09 DIAGNOSIS — R109 Unspecified abdominal pain: Secondary | ICD-10-CM | POA: Diagnosis not present

## 2016-11-10 ENCOUNTER — Telehealth: Payer: Self-pay | Admitting: Gastroenterology

## 2016-11-10 ENCOUNTER — Encounter: Payer: Self-pay | Admitting: Gastroenterology

## 2016-11-10 ENCOUNTER — Encounter: Payer: Self-pay | Admitting: Internal Medicine

## 2016-11-10 NOTE — Addendum Note (Signed)
Addended byRita Ohara on: 11/10/2016 03:55 PM   Modules accepted: Orders

## 2016-11-10 NOTE — Telephone Encounter (Signed)
This is definitely not an emergency to treat her  H pylori. This can be revisited with her upcoming appt

## 2016-11-10 NOTE — Telephone Encounter (Signed)
She took 7 out of 14 days, stopped 4/11.

## 2016-11-10 NOTE — Telephone Encounter (Signed)
Pt has been advised she can take imodium as needed.

## 2016-11-10 NOTE — Telephone Encounter (Signed)
Advise pt--I entered order for her to get a chest x-ray.  I was waiting to see if ultrasound showed anything that could contribute to her left sided discomfort, but it looked normal.  She can go at her convenience to [facility] imaging during their walk-in hours.    I entered future orders for prior to her October wellness visit (I forgot to change the "expected date" to say October, but all the entered orders should be released that day--some say earlier than that).

## 2016-11-11 ENCOUNTER — Encounter: Payer: Self-pay | Admitting: Family Medicine

## 2016-11-13 ENCOUNTER — Other Ambulatory Visit: Payer: Self-pay | Admitting: Family Medicine

## 2016-11-13 ENCOUNTER — Ambulatory Visit
Admission: RE | Admit: 2016-11-13 | Discharge: 2016-11-13 | Disposition: A | Discharge status: Home / Self Care | Payer: Medicare Other | Source: Ambulatory Visit | Attending: Family Medicine | Admitting: Family Medicine

## 2016-11-13 DIAGNOSIS — R0789 Other chest pain: Secondary | ICD-10-CM

## 2016-11-13 DIAGNOSIS — R0781 Pleurodynia: Secondary | ICD-10-CM | POA: Diagnosis not present

## 2016-11-13 NOTE — Telephone Encounter (Signed)
Advised pt of Dr Knapp's instructions °

## 2016-11-15 ENCOUNTER — Encounter: Payer: Self-pay | Admitting: Family Medicine

## 2016-11-15 ENCOUNTER — Ambulatory Visit (INDEPENDENT_AMBULATORY_CARE_PROVIDER_SITE_OTHER): Payer: Medicare Other | Admitting: Family Medicine

## 2016-11-15 VITALS — BP 100/70 | HR 64 | Resp 16 | Wt 104.6 lb

## 2016-11-15 DIAGNOSIS — R634 Abnormal weight loss: Secondary | ICD-10-CM | POA: Diagnosis not present

## 2016-11-15 DIAGNOSIS — E039 Hypothyroidism, unspecified: Secondary | ICD-10-CM | POA: Diagnosis not present

## 2016-11-15 DIAGNOSIS — R0789 Other chest pain: Secondary | ICD-10-CM | POA: Diagnosis not present

## 2016-11-15 NOTE — Patient Instructions (Signed)
  Use tylenol and/or topical medications (topical lidocaine or biofreeze or icy hot) as needed for the pain at your ribs. It seems to be a lot less frequent than before--hopefully it at some point will resolve completely. Do the stretches as shown for the upper back discomfort. Consider re-trying the voltaren gel to the painful area at the left breastplate/sternum  (where it hurt to touch)  Be sure to get enough calories throughout the day to maintain your weight.  It is okay to eat small meals and snacks frequently throughout the day if you are afraid to eat larger meals.  It sounds like your stomach is doing much better since treating for H.pylori--so don't live in fear. If you are not having pain, don't restrict your diet.   I have scheduled a thyroid test with your labs for your wellness exam this fall--that may not be needed if you are getting this checked regularly by Dr. Buddy Duty.  Check with him to see when your last test was--should be checked at least once yearly (more often only if having thyroid symptoms or if medication was adjusted).

## 2016-11-15 NOTE — Progress Notes (Signed)
Chief Complaint  Patient presents with  . Advice Only    skin peeling, discuss x-ray results, and discuss medications.    She stopped her antibiotics (to treat H.pylori infection) on 4/11--it was bothering her stomach, and she had noticed significant skin peeling on her hands.  Peeling has gotten a lot better, gradually. Denies any pain, itching, just still a little peeling (she is picking at it during visit).  She noticed a "knot" on her right foot, on the top of the foot--it was small, got bigger, feels hard and dry.  It is not painful.  Concerned about weight loss.  Her stomach was "awful, pure hell" while taking the antibiotics.  But now reports stomach is much better--she has a good appetite and is eating well without bloating and pain. Husband states she isn't eating "well"--she reports being "afraid to eat".  Eating small amounts. Asking about adding protein powder. Denies any further bloating/pain with eating.  Hypothyroidism--she thinks she last saw Dr. Buddy Duty in 08/2015 per pt, but reports she has plenty of refills. Husband thinks she may have been seen more recently.  This is managed by Dr. Buddy Duty. She feels cold all the time, weight loss, no hair changes.  +skin peeling as above.   f/u on pain in left chest, below breast:  She had rib films which were normal. Pain in the left chest is not constant, comes and goes.  Kept her awake last night.  It previously was more constant.  Pain is no longer daily, but when it comes on, it is very painful.  She hadn't tried taking anything for the pain.  Hasn't been taking any tylenol recently. Last night her pain was at her right upper back.  Has a lot of repetitive motions--reach up to a high microwave is bothering her.  She sees rheumatologist (Dr. [name]) who also runs a lot of blood tests. She reports she states she has lupus.  Bowels are back to normal (were loose with antibiotics).  She takes Electronics engineer daily.  PMH, PSH, SH reviewed  Outpatient  Encounter Prescriptions as of 11/15/2016  Medication Sig Note  . acetaminophen (TYLENOL) 650 MG CR tablet Take 650 mg by mouth as needed for pain. Reported on 09/06/2015   . ALPRAZolam (XANAX) 0.5 MG tablet Take 0.25 mg by mouth 2 (two) times daily as needed.  05/04/2015: Taking 1/2 tablet in morning and noon, full tablet at bedtime  . b complex vitamins capsule Take 1 capsule by mouth as needed.  10/26/2016: daily  . Cholecalciferol (VITAMIN D) 2000 UNITS tablet Take 2,000 Units by mouth daily. 05/04/2015: Taking daily  . Loperamide HCl (IMODIUM PO) Take 1 tablet by mouth as needed. Reported on 09/06/2015   . Probiotic Product (ALIGN PO) Take by mouth.   . SYNTHROID 50 MCG tablet Take 50 mg by mouth every morning.   . vitamin E 400 UNIT capsule Take 400 Units by mouth every Monday, Wednesday, and Friday. Reported on 09/06/2015    No facility-administered encounter medications on file as of 11/15/2016.    Allergies  Allergen Reactions  . Iodine Anaphylaxis  . Levsin [Hyoscyamine Sulfate]     Vision problems/pt has glaucoma  . Salmon [Fish Allergy] Hives and Shortness Of Breath  . Shellfish Allergy Anaphylaxis  . Remeron [Mirtazapine] Other (See Comments)    Cause blurred vision and red eyes, pt has glaucoma  . Aspirin Other (See Comments)    Sever stomach pain due to ulcer scaring.  . Ciprofloxacin Diarrhea  .  Codeine Nausea And Vomiting  . Darvocet [Propoxyphene N-Acetaminophen] Nausea And Vomiting  . Demerol [Meperidine] Nausea Only  . Dexilant [Dexlansoprazole] Swelling  . Diphedryl [Diphenhydramine] Other (See Comments)    Increased pulse/small amount ok  . Doxycycline Hyclate Other (See Comments)    GI intolerance.  Marland Kitchen Epinephrine Other (See Comments)    Breathing problems  . Erythromycin Other (See Comments)    GI intolerance.  Yvette Rack [Cyclobenzaprine] Other (See Comments)    Tingly/prickly sensation.  Marland Kitchen Keflex [Cephalexin] Hives  . Latex Other (See Comments)    Gloves ok   . Prednisone Other (See Comments)    Headache  . Pylera [Bis Subcit-Metronid-Tetracyc] Swelling    Tongue swelling. Face tingling  . Sulfa Antibiotics Other (See Comments)    Increased pulse, fainting, diarrhea, thrush  . Xylocaine [Lidocaine Hcl]     With epinephrine, given by dentist.  Speeded up heart rate and she passed out (occured twice, at dentist)  . Zoloft [Sertraline Hcl] Swelling and Other (See Comments)    Migraine Swelling of tongue/lip (09/2012)  . Advil [Ibuprofen] Other (See Comments)    Motrin ok with a GI effect.  . Clarithromycin Rash    Started after completing 10 day course of 2000 mg /day   ROS: no fever, chills, vomiting, diarrhea, abdominal pain, urinary complaints. +weight loss, left sided chest wall pain, skin concern on her foot as per HPI.  No urinary complaints, URI symptoms, shortness of breath.  PHYSICAL EXAM:  BP 100/70   Pulse 64   Resp 16   Wt 104 lb 9.6 oz (47.4 kg)   SpO2 93%   BMI 21.13 kg/m   Wt Readings from Last 3 Encounters:  11/15/16 104 lb 9.6 oz (47.4 kg)  11/07/16 106 lb 3.2 oz (48.2 kg)  10/26/16 107 lb (48.5 kg)   Pleasant, elderly, thin female in no distress.   HEENT: conjunctiva and sclera are clear Neck: no lymphadenopathy, thyromegaly or mass Heart: regular rate and rhythm Lungs: clear bilaterally Chest: Tender along 3-4 levels of costochondral junction on the left, at level of breast.  Also tender along the rib below the left breast (same as prior exam) Area of discomfort in her back (nontender now) is at her rhomboid muscles, lower shoulder blade. No CVA tenderness or spinal tenderness Abdomen: soft, nontender Extremities: no edema Right foot--top of midfoot there is a 0.5cm superficial abrasion.  No surrounding erythema.  There is a bony prominence below this, separate from the superficial skin lesion. Extremities: no edema Hands--one very small area of dry skin on the right radial aspect of index finger. Neuro:  alert and oriented.  Mild resting tremor noted in hands, R>L   ASSESSMENT/PLAN:  Chest wall pain - L sided--seems to be improving; reviewed supportive measures (tylenol, topical meds, warm compress) prn  Weight loss - reassured--discussed adequate caloric intake, frequent snacks if eating only small meals  Hypothyroidism, unspecified type - managed by Dr. Prescott Gum sx, don't suspect peeling fingers or wt changes are related to thyroid.    25 min visit, more than 1/2 spent counseling.  Use tylenol and/or topical medications (topical lidocaine or biofreeze or icy hot) as needed for the pain at your ribs. It seems to be a lot less frequent than before--hopefully it at some point will resolve completely. Do the stretches as shown for the upper back discomfort. Consider re-trying the voltaren gel to the painful area at the left breastplate/sternum  (where it hurt to touch)  Be sure  to get enough calories throughout the day to maintain your weight.  It is okay to eat small meals and snacks frequently throughout the day if you are afraid to eat larger meals.  It sounds like your stomach is doing much better since treating for H.pylori--so don't live in fear. If you are not having pain, don't restrict your diet.   I have scheduled a thyroid test with your labs for your wellness exam this fall--that may not be needed if you are getting this checked regularly by Dr. Buddy Duty.  Check with him to see when your last test was--should be checked at least once yearly (more often only if having thyroid symptoms or if medication was adjusted).

## 2016-11-16 ENCOUNTER — Telehealth: Payer: Self-pay | Admitting: Family Medicine

## 2016-11-16 NOTE — Telephone Encounter (Signed)
Pt aware and verbalized understanding that nutritionist is not recommended at this time. Tracy Malone

## 2016-11-16 NOTE — Telephone Encounter (Signed)
Pt called and states she forgot to ask you if she see a nutritionist,if you recommend it. states she is not eating well, states you and her discussed all of this at her office meeting, pt can be reached at [phone]

## 2016-11-16 NOTE — Telephone Encounter (Signed)
At this point, I don't think she needs to see a nutritionist.  If she has trouble maintaining her weight (continues to lose), then it would be a good idea.  I'm not sure that insurance would cover it at this point.  Just have her continue to try and get enough caloric intake (by eating frequently, if unable to eat a lot at a time), as we discussed.  Please advise

## 2016-11-17 DIAGNOSIS — H01001 Unspecified blepharitis right upper eyelid: Secondary | ICD-10-CM | POA: Diagnosis not present

## 2016-11-17 DIAGNOSIS — H01002 Unspecified blepharitis right lower eyelid: Secondary | ICD-10-CM | POA: Diagnosis not present

## 2016-11-17 DIAGNOSIS — H01005 Unspecified blepharitis left lower eyelid: Secondary | ICD-10-CM | POA: Diagnosis not present

## 2016-11-17 DIAGNOSIS — H01004 Unspecified blepharitis left upper eyelid: Secondary | ICD-10-CM | POA: Diagnosis not present

## 2016-11-17 NOTE — Telephone Encounter (Signed)
Thanks for fielding her questions. She has been back to see dr. Ardis Hughs with plan to retest for hpyloi in 3 wk. She had pcp appt on 4/25 where she appears to be improving. I will check in on her

## 2016-11-17 NOTE — Telephone Encounter (Signed)
No worries :)

## 2016-11-20 ENCOUNTER — Encounter: Payer: Self-pay | Admitting: Family Medicine

## 2016-11-21 ENCOUNTER — Encounter: Payer: Self-pay | Admitting: Gastroenterology

## 2016-11-21 ENCOUNTER — Encounter: Payer: Self-pay | Admitting: Family Medicine

## 2016-11-22 ENCOUNTER — Other Ambulatory Visit: Payer: Medicare Other

## 2016-11-22 DIAGNOSIS — Z8619 Personal history of other infectious and parasitic diseases: Secondary | ICD-10-CM

## 2016-11-22 DIAGNOSIS — R1011 Right upper quadrant pain: Secondary | ICD-10-CM | POA: Diagnosis not present

## 2016-11-22 NOTE — Telephone Encounter (Signed)
Dr Ardis Hughs we do not offer breath test but I have heard that [facility] does.  Do you want me to offer that to her or explain that the stool testing for H pylori was negative.  She was notified of the result but she is very anxious and maybe this would ease her anxiety.  Thanks

## 2016-11-23 ENCOUNTER — Other Ambulatory Visit: Payer: Medicare Other

## 2016-11-23 DIAGNOSIS — R5382 Chronic fatigue, unspecified: Secondary | ICD-10-CM | POA: Diagnosis not present

## 2016-11-23 DIAGNOSIS — Z13220 Encounter for screening for lipoid disorders: Secondary | ICD-10-CM

## 2016-11-23 DIAGNOSIS — Z5181 Encounter for therapeutic drug level monitoring: Secondary | ICD-10-CM | POA: Diagnosis not present

## 2016-11-23 DIAGNOSIS — E785 Hyperlipidemia, unspecified: Secondary | ICD-10-CM | POA: Diagnosis not present

## 2016-11-23 LAB — COMPREHENSIVE METABOLIC PANEL
ALBUMIN: 3.8 g/dL (ref 3.6–5.1)
ALT: 12 U/L (ref 6–29)
AST: 20 U/L (ref 10–35)
Alkaline Phosphatase: 60 U/L (ref 33–130)
BILIRUBIN TOTAL: 1.3 mg/dL — AB (ref 0.2–1.2)
BUN: 16 mg/dL (ref 7–25)
CO2: 26 mmol/L (ref 20–31)
CREATININE: 0.67 mg/dL (ref 0.60–0.88)
Calcium: 9.2 mg/dL (ref 8.6–10.4)
Chloride: 106 mmol/L (ref 98–110)
GLUCOSE: 85 mg/dL (ref 65–99)
Potassium: 4.2 mmol/L (ref 3.5–5.3)
SODIUM: 143 mmol/L (ref 135–146)
Total Protein: 6.6 g/dL (ref 6.1–8.1)

## 2016-11-23 LAB — LIPID PANEL
Cholesterol: 218 mg/dL — ABNORMAL HIGH (ref <200)
HDL: 78 mg/dL (ref >50)
LDL CALC: 115 mg/dL — AB (ref <100)
TRIGLYCERIDES: 123 mg/dL (ref <150)
Total CHOL/HDL Ratio: 2.8 Ratio (ref <5.0)
VLDL: 25 mg/dL (ref <30)

## 2016-11-23 LAB — HELICOBACTER PYLORI  SPECIAL ANTIGEN: H. PYLORI ANTIGEN STOOL: NOT DETECTED

## 2016-11-23 LAB — CBC WITH DIFFERENTIAL/PLATELET
BASOS ABS: 87 {cells}/uL (ref 0–200)
Basophils Relative: 1 %
EOS ABS: 522 {cells}/uL — AB (ref 15–500)
EOS PCT: 6 %
HCT: 41.5 % (ref 35.0–45.0)
Hemoglobin: 13.6 g/dL (ref 11.7–15.5)
Lymphocytes Relative: 37 %
Lymphs Abs: 3219 cells/uL (ref 850–3900)
MCH: 29.4 pg (ref 27.0–33.0)
MCHC: 32.8 g/dL (ref 32.0–36.0)
MCV: 89.6 fL (ref 80.0–100.0)
MPV: 10.3 fL (ref 7.5–12.5)
Monocytes Absolute: 957 cells/uL — ABNORMAL HIGH (ref 200–950)
Monocytes Relative: 11 %
NEUTROS ABS: 3915 {cells}/uL (ref 1500–7800)
Neutrophils Relative %: 45 %
PLATELETS: 257 10*3/uL (ref 140–400)
RBC: 4.63 MIL/uL (ref 3.80–5.10)
RDW: 13.5 % (ref 11.0–15.0)
WBC: 8.7 10*3/uL (ref 4.0–10.5)

## 2016-11-23 LAB — TSH: TSH: 1.81 m[IU]/L

## 2016-11-24 ENCOUNTER — Encounter: Payer: Self-pay | Admitting: Family Medicine

## 2016-11-24 DIAGNOSIS — I700 Atherosclerosis of aorta: Secondary | ICD-10-CM | POA: Insufficient documentation

## 2016-11-28 DIAGNOSIS — F411 Generalized anxiety disorder: Secondary | ICD-10-CM | POA: Diagnosis not present

## 2016-11-30 DIAGNOSIS — R102 Pelvic and perineal pain: Secondary | ICD-10-CM | POA: Diagnosis not present

## 2016-12-20 ENCOUNTER — Telehealth: Payer: Self-pay | Admitting: Neurology

## 2016-12-20 NOTE — Telephone Encounter (Signed)
I have spoken with pt. today. She feels essential tremor is some worse.  Describes knots under the skin right arm, for weeks. Sts. not a rash. Has been eval by pcp.  I offered sooner appt., which conflicted with an appt. she already had with rheumatology.  Per pt's request, I have added her to the cancellation list I keep for RAS pt's/fim

## 2016-12-20 NOTE — Telephone Encounter (Signed)
Pt is having raised bumps on the rt arm that started last month. Pt has seen PCP in April when the 1st bump appeared and she suggested to be seen by Dr Felecia Shelling. Dr Felecia Shelling is booked out.  Please call

## 2017-01-10 ENCOUNTER — Ambulatory Visit (INDEPENDENT_AMBULATORY_CARE_PROVIDER_SITE_OTHER): Payer: Medicare Other | Admitting: Licensed Clinical Social Worker

## 2017-01-10 DIAGNOSIS — F411 Generalized anxiety disorder: Secondary | ICD-10-CM

## 2017-01-18 ENCOUNTER — Encounter: Payer: Self-pay | Admitting: Family Medicine

## 2017-01-18 ENCOUNTER — Ambulatory Visit (INDEPENDENT_AMBULATORY_CARE_PROVIDER_SITE_OTHER): Payer: Medicare Other | Admitting: Family Medicine

## 2017-01-18 VITALS — BP 130/60 | HR 72 | Ht 59.0 in | Wt 107.0 lb

## 2017-01-18 DIAGNOSIS — R5383 Other fatigue: Secondary | ICD-10-CM

## 2017-01-18 DIAGNOSIS — F419 Anxiety disorder, unspecified: Secondary | ICD-10-CM

## 2017-01-18 DIAGNOSIS — M799 Soft tissue disorder, unspecified: Secondary | ICD-10-CM | POA: Diagnosis not present

## 2017-01-18 DIAGNOSIS — E039 Hypothyroidism, unspecified: Secondary | ICD-10-CM

## 2017-01-18 DIAGNOSIS — F339 Major depressive disorder, recurrent, unspecified: Secondary | ICD-10-CM

## 2017-01-18 DIAGNOSIS — R251 Tremor, unspecified: Secondary | ICD-10-CM | POA: Diagnosis not present

## 2017-01-18 DIAGNOSIS — M7989 Other specified soft tissue disorders: Secondary | ICD-10-CM

## 2017-01-18 NOTE — Progress Notes (Signed)
Chief Complaint  Patient presents with  . Advice Only    thinks maybe her thyroid might be off. Having nervousness and she is jittery, low energy, some hair loss, dry skin, constipation and diarrhea as well as bruising easily. Has some bumps in her right bicep area that came about a month ago after she stopped taking wellbutrin.     She feels tired, jittery, nervous/"edgy".  I feel "crappy". Having some hair loss, seems to be off/on.  Over the last month she wakes up with chills (only upper body, not every morning, just intermittently).  Constipation was worse when on Wellbutrin (from Dr. Casimiro Needle). This medication also caused worsening tremor and pressure in her eye. Took it for over a month, stopped it 2 weeks ago. Sees Dr. Casimiro Needle again in July.  She is wondering if her thyroid could be the cause of all of these symptoms. Last had TSH done 11/23/16 and was normal at 1.81. She takes the thyroid medication with a little bit of butter to help her swallow it (always has, no recent change). Last saw Dr. Buddy Duty in December. Doesn't plan to go back to see him.  Depression was improved after TMS (finished in December).  About a month later she started having weird dreams.  Had recurrent depression around February (when the weird dreams started, felt like they were kind of like hallucinations).  She is having trouble sleeping, unable to sleep past 5am. She falls asleep okay, wakes up at 5am.  Goes to bed 11-12--has been trying to stay up later to see if it will help her sleep later, but she still wakes up at 5 (so getting less sleep). Not napping during the day. She admits to being worried about her husband (who has lost weight, and may also be depressed)--who is present today, and who in turn, is very worried about her.  Alprazolam (regular use) had helped with tremor, until she took the wellbutrin.  Not helping as much as before. She continues to have more noticeable tremor, even though wellbutrin was  stopped.  She and her husband also have other questions today-- Questions about bruising. Questions about the lumps in her right arm (unchanged since last discussed)  PMH, PSH, [place] reviewed  Outpatient Encounter Prescriptions as of 01/18/2017  Medication Sig Note  . ALPRAZolam (XANAX) 0.5 MG tablet Take 0.5 mg by mouth 3 (three) times daily.  01/18/2017: tid  . b complex vitamins capsule Take 1 capsule by mouth as needed.  10/26/2016: daily  . Cholecalciferol (VITAMIN D) 2000 UNITS tablet Take 2,000 Units by mouth daily. 05/04/2015: Taking daily  . Probiotic Product (ALIGN PO) Take 1 capsule by mouth every other day.    Marland Kitchen SYNTHROID 50 MCG tablet Take 50 mg by mouth every morning.   . vitamin E 400 UNIT capsule Take 400 Units by mouth every Monday, Wednesday, and Friday. Reported on 09/06/2015   . acetaminophen (TYLENOL) 650 MG CR tablet Take 650 mg by mouth as needed for pain. Reported on 09/06/2015   . Loperamide HCl (IMODIUM PO) Take 1 tablet by mouth as needed. Reported on 09/06/2015    No facility-administered encounter medications on file as of 01/18/2017.    Allergies  Allergen Reactions  . Iodine Anaphylaxis  . Levsin [Hyoscyamine Sulfate]     Vision problems/pt has glaucoma  . [place]mon [Fish Allergy] Hives and Shortness Of Breath  . Shellfish Allergy Anaphylaxis  . Remeron [Mirtazapine] Other (See Comments)    Cause blurred vision and red  eyes, pt has glaucoma  . Aspirin Other (See Comments)    Sever stomach pain due to ulcer scaring.  . Ciprofloxacin Diarrhea  . Codeine Nausea And Vomiting  . Darvocet [Propoxyphene N-Acetaminophen] Nausea And Vomiting  . Demerol [Meperidine] Nausea Only  . Dexilant [Dexlansoprazole] Swelling  . Diphedryl [Diphenhydramine] Other (See Comments)    Increased pulse/small amount ok  . Doxycycline Hyclate Other (See Comments)    GI intolerance.  Marland Kitchen Epinephrine Other (See Comments)    Breathing problems  . Erythromycin Other (See Comments)    GI  intolerance.  Yvette Rack [Cyclobenzaprine] Other (See Comments)    Tingly/prickly sensation.  Marland Kitchen Keflex [Cephalexin] Hives  . Latex Other (See Comments)    Gloves ok  . Prednisone Other (See Comments)    Headache  . Pylera [Bis Subcit-Metronid-Tetracyc] Swelling    Tongue swelling. Face tingling  . Sulfa Antibiotics Other (See Comments)    Increased pulse, fainting, diarrhea, thrush  . Xylocaine [Lidocaine Hcl]     With epinephrine, given by dentist.  Speeded up heart rate and she passed out (occured twice, at dentist)  . Zoloft [Sertraline Hcl] Swelling and Other (See Comments)    Migraine Swelling of tongue/lip (09/2012)  . Advil [Ibuprofen] Other (See Comments)    Motrin ok with a GI effect.  . Clarithromycin Rash    Started after completing 10 day course of 2000 mg /day   ROS:  +fatigue, anxiety, tremor, depression, bowel changes as per HPI.  No fever, chills, URI symptoms, rash.   PHYSICAL EXAM: BP 130/60 (BP Location: Left Arm, Patient Position: Sitting, Cuff Size: Normal)   Pulse 72   Ht 4\' 11"  (1.499 m)   Wt 107 lb (48.5 kg)   BMI 21.61 kg/m   Elderly female, with notable tremor in RUE. She appears depressed, and was quite tearful and end of visit. Full range of affect throughout the visit, able to make some jokes, but overall appeared frustrated, helpless. She has normal eye contact, hygiene, grooming, speech. Neck: no lymphadenopathy, thyromegaly or mass Heart; regular rate and rhythm Lungs: clear Upper right medial arm, 3x3 fleshy, protuberant soft tissue mass, very soft (present x 30 years, unchanged per pt). Mid-arm, there is a smaller subcutaneous mass, measures 1.5-2 mm, soft, not as large/not protruding. A second smaller one is just medial to this. Skin: Senile purpura R hand Neuro: alert and oriented, cranial nerves intact. Normal gait. Tremor more noticeable on right.  ASSESSMENT/PLAN:  Hypothyroidism, unspecified type - reassured of normal recent TSH,  and other reasons for her symptoms. Cont current thyroid dose  Episode of recurrent major depressive disorder, unspecified depression episode severity ([place]) - initial response to [place], but not with recurrent depression. Intolerant of many meds; f/u with Dr. Casimiro Needle. Counseled; contracts for safety. supportive husband  Tremor  Anxiety  Soft tissue mass - right upper extremity--soft, nontender,  not changing. Reassured. excision recommended if tenderness, increase in size orother change  Other fatigue - contributed by inadequate sleep. counseled re: sleep hygiene, encouraged her to go to bed earlier.   25-30 min visit, more than 1/2 spent counseling.   Please try and go to bed earlier, with a goal of getting at least 6-8 hours of sleep.  Staying up later has not been effective to get you to sleep later, so let's try a different technique. I think not getting enough sleep is worsening your moods.  Go to bed earlier. Don't worry about the time on the clock when you  wake up. Just wake up and start your day.  Don't get frustrated and try and toss and turn and keep sleeping.

## 2017-01-18 NOTE — Patient Instructions (Signed)
  Please try and go to bed earlier, with a goal of getting at least 6-8 hours of sleep.  Staying up later has not been effective to get you to sleep later, so let's try a different technique. I think not getting enough sleep is worsening your moods.  Go to bed earlier. Don't worry about the time on the clock when you wake up. Just wake up and start your day.  Don't get frustrated and try and toss and turn and keep sleeping.

## 2017-01-19 DIAGNOSIS — M25511 Pain in right shoulder: Secondary | ICD-10-CM | POA: Diagnosis not present

## 2017-01-26 ENCOUNTER — Ambulatory Visit (INDEPENDENT_AMBULATORY_CARE_PROVIDER_SITE_OTHER): Payer: Medicare Other | Admitting: Licensed Clinical Social Worker

## 2017-01-26 DIAGNOSIS — F331 Major depressive disorder, recurrent, moderate: Secondary | ICD-10-CM | POA: Diagnosis not present

## 2017-01-30 ENCOUNTER — Encounter: Payer: Self-pay | Admitting: Family Medicine

## 2017-01-31 ENCOUNTER — Ambulatory Visit (INDEPENDENT_AMBULATORY_CARE_PROVIDER_SITE_OTHER): Payer: Medicare Other | Admitting: Licensed Clinical Social Worker

## 2017-01-31 DIAGNOSIS — F331 Major depressive disorder, recurrent, moderate: Secondary | ICD-10-CM | POA: Diagnosis not present

## 2017-02-05 ENCOUNTER — Other Ambulatory Visit (INDEPENDENT_AMBULATORY_CARE_PROVIDER_SITE_OTHER): Payer: Medicare Other

## 2017-02-05 DIAGNOSIS — Z5181 Encounter for therapeutic drug level monitoring: Secondary | ICD-10-CM

## 2017-02-07 DIAGNOSIS — M25569 Pain in unspecified knee: Secondary | ICD-10-CM | POA: Diagnosis not present

## 2017-02-07 DIAGNOSIS — F329 Major depressive disorder, single episode, unspecified: Secondary | ICD-10-CM | POA: Diagnosis not present

## 2017-02-07 DIAGNOSIS — M797 Fibromyalgia: Secondary | ICD-10-CM | POA: Diagnosis not present

## 2017-02-07 DIAGNOSIS — M329 Systemic lupus erythematosus, unspecified: Secondary | ICD-10-CM | POA: Diagnosis not present

## 2017-02-08 ENCOUNTER — Encounter (HOSPITAL_COMMUNITY): Payer: Self-pay | Admitting: Psychiatry

## 2017-02-08 DIAGNOSIS — F411 Generalized anxiety disorder: Secondary | ICD-10-CM | POA: Diagnosis not present

## 2017-02-08 NOTE — Progress Notes (Unsigned)
  Patient ID: Tracy Malone, female   DOB: 1932-05-24, 83 y.o.   MRN: [id number]   I met with Tracy Malone with her husband.  She did well after the initial [place] but in the last month has begun to become depressed again.  The trigger seemed to be a round of having to take 4 different antibiotics for an infection which made her physically ill and the depression started again.  Says she currently is crying for no reason and wants to withdraw and avoid social contacts.  Things are starting to make her easily irritated.  Her husband sees more depression than she perceives herself.  They both say this is how her worsening depression begins  Plan:  Recommend another round of [place] as it has been the only thing that has helped recently.  Diagnosis remains Major depression, recurrent severe without psychosis

## 2017-02-09 ENCOUNTER — Encounter: Payer: Self-pay | Admitting: Family Medicine

## 2017-02-13 ENCOUNTER — Other Ambulatory Visit (HOSPITAL_COMMUNITY): Payer: Medicare Other | Attending: Psychiatry | Admitting: Emergency Medicine

## 2017-02-13 DIAGNOSIS — F332 Major depressive disorder, recurrent severe without psychotic features: Secondary | ICD-10-CM | POA: Insufficient documentation

## 2017-02-13 NOTE — Progress Notes (Signed)
Pt reported to [facility] for Repetitive Transcranial Magnetic Stimulation treatment for Major Depressive Disorder. Pt presented with pleasant affect. Pt reported no change in alcohol/substance use, caffeine consumption, sleep pattern.Pt reported that she is glad to be back to complete another round of [place]. She stated that she has been having an extremely hard time. Pt watched television during tx. Pt with no complaints post-tx. Pt left clinic with her husband with no problems or issues

## 2017-02-14 ENCOUNTER — Other Ambulatory Visit (INDEPENDENT_AMBULATORY_CARE_PROVIDER_SITE_OTHER): Payer: Medicare Other | Admitting: Emergency Medicine

## 2017-02-14 DIAGNOSIS — F332 Major depressive disorder, recurrent severe without psychotic features: Secondary | ICD-10-CM | POA: Diagnosis not present

## 2017-02-14 NOTE — Progress Notes (Addendum)
Pt reported to [facility] for Repetitive Transcranial Magnetic Stimulation treatment for Major Depressive Disorder. Pt presented with pleasant affect. Pt reported no change in alcohol/substance use, caffeine consumption, sleep pattern. Pt watched television during tx. Pt with no complaints post-tx. Pt left clinic with her husband with no problems or issues

## 2017-02-15 ENCOUNTER — Encounter: Payer: Self-pay | Admitting: Family Medicine

## 2017-02-15 ENCOUNTER — Other Ambulatory Visit (INDEPENDENT_AMBULATORY_CARE_PROVIDER_SITE_OTHER): Payer: Medicare Other | Admitting: Emergency Medicine

## 2017-02-15 DIAGNOSIS — F332 Major depressive disorder, recurrent severe without psychotic features: Secondary | ICD-10-CM | POA: Diagnosis not present

## 2017-02-15 NOTE — Progress Notes (Signed)
Pt reported to [facility] for Repetitive Transcranial Magnetic Stimulation treatment for Major Depressive Disorder. Pt presented with pleasant affect. Pt reported no change in alcohol/substance use, caffeine consumption, sleep pattern. Pt watched television during tx. Pt with no complaints post-tx. Pt left clinic with her husband with no problems or issues

## 2017-02-16 ENCOUNTER — Encounter: Payer: Self-pay | Admitting: Family Medicine

## 2017-02-16 ENCOUNTER — Other Ambulatory Visit (HOSPITAL_COMMUNITY): Payer: Medicare Other | Admitting: Emergency Medicine

## 2017-02-16 DIAGNOSIS — F332 Major depressive disorder, recurrent severe without psychotic features: Secondary | ICD-10-CM

## 2017-02-16 NOTE — Progress Notes (Signed)
Patient reported to [facility] for Repetitive Transcranial Magnetic Stimulation treatment for severe episode of recurrent major depressive disorder, without psychotic features. Patient presented with appropriate affect, level mood and denied any suicidal or homicidal ideations. Patient denies any other current symptoms and remains optimistic with continued [place] treatment. Patient reported no change in alcohol/substane use, caffeine consumption, sleep pattern or metal implant status since previous treatment. Pt watched tv during sessions. Ptremained at 120% for the duration of treatment after initial increase with first five impulse series.Patient reported no complaints, pain, discomfort or problems with treatment through post out session and completed session with no noted concerns.Patient and husband departed post-treatment with no reported concern.

## 2017-02-19 ENCOUNTER — Other Ambulatory Visit (INDEPENDENT_AMBULATORY_CARE_PROVIDER_SITE_OTHER): Payer: Medicare Other | Admitting: Emergency Medicine

## 2017-02-19 ENCOUNTER — Ambulatory Visit (INDEPENDENT_AMBULATORY_CARE_PROVIDER_SITE_OTHER): Payer: Medicare Other | Admitting: Licensed Clinical Social Worker

## 2017-02-19 ENCOUNTER — Encounter (HOSPITAL_COMMUNITY): Payer: Self-pay | Admitting: Psychiatry

## 2017-02-19 DIAGNOSIS — F332 Major depressive disorder, recurrent severe without psychotic features: Secondary | ICD-10-CM | POA: Diagnosis not present

## 2017-02-19 DIAGNOSIS — F3341 Major depressive disorder, recurrent, in partial remission: Secondary | ICD-10-CM | POA: Diagnosis not present

## 2017-02-19 NOTE — Progress Notes (Addendum)
Patient reported to [facility] for Repetitive Transcranial Magnetic Stimulation treatment for severe episode of recurrent major depressive disorder, without psychotic features. Patient presented with appropriate affect, level mood and denied any suicidal or homicidal ideations. Patient denies any other current symptoms and remains optimistic with continued [place] treatment. Patient reported no change in alcohol/substane use, caffeine consumption, sleep pattern or metal implant status since previous treatment. Pt watched tv during sessions. Pt stated that she was constipated during the weekend. She took a laxative. Pt shared she feels better.Ptremained at 120% for the duration of treatment after initial increase with first five impulse series.Patient reported no complaints, pain, discomfort or problems with treatment through post out session and completed session with no noted concerns.Patient and husband departed post-treatment with no reported concern.

## 2017-02-20 ENCOUNTER — Other Ambulatory Visit (INDEPENDENT_AMBULATORY_CARE_PROVIDER_SITE_OTHER): Payer: Medicare Other | Admitting: Emergency Medicine

## 2017-02-20 DIAGNOSIS — F332 Major depressive disorder, recurrent severe without psychotic features: Secondary | ICD-10-CM

## 2017-02-20 NOTE — Progress Notes (Signed)
Patient reported to [facility] for Repetitive Transcranial Magnetic Stimulation treatment for severe episode of recurrent major depressive disorder, without psychotic features. Patient presented with appropriate affect, level mood and denied any suicidal or homicidal ideations. Patient denies any other current symptoms and remains optimistic with continued [place] treatment. Patient reported no change in alcohol/substane use, caffeine consumption, sleep pattern or metal implant status since previous treatment. Pt watched tv during sessions. Pt stated that she was having negative thoughts - she did not have a good morning. Ptremained at 120% for the duration of treatment after initial increase with first five impulse series.Patient reported no complaints, pain, discomfort or problems with treatment through post out session and completed session with no noted concerns.Patient and husbanddeparted post-treatment with no reported concern.

## 2017-02-21 ENCOUNTER — Other Ambulatory Visit (HOSPITAL_COMMUNITY): Payer: Medicare Other | Attending: Psychiatry | Admitting: Emergency Medicine

## 2017-02-21 DIAGNOSIS — F332 Major depressive disorder, recurrent severe without psychotic features: Secondary | ICD-10-CM | POA: Diagnosis not present

## 2017-02-21 NOTE — Progress Notes (Signed)
Patient reported to [facility] for Repetitive Transcranial Magnetic Stimulation treatment for severe episode of recurrent major depressive disorder, without psychotic features. Patient presented with appropriate affect, level mood and denied any suicidal or homicidal ideations. Patient denies any other current symptoms and remains optimistic with continued [place] treatment. Patient reported no change in alcohol/substane use, caffeine consumption, sleep pattern or metal implant status since previous treatment. Pt came in tearful and expressed that she is feeling hopeless. Writer provided comfort and encouragement. Ptremained at 120% for the duration of treatment after initial increase with first five impulse series.Patient reported no complaints, pain, discomfort or problems with treatment through post out session and completed session with no noted concerns.Patient and husbanddeparted post-treatment with no reported concern.

## 2017-02-22 ENCOUNTER — Other Ambulatory Visit (INDEPENDENT_AMBULATORY_CARE_PROVIDER_SITE_OTHER): Payer: Medicare Other | Admitting: Emergency Medicine

## 2017-02-22 DIAGNOSIS — F332 Major depressive disorder, recurrent severe without psychotic features: Secondary | ICD-10-CM

## 2017-02-22 NOTE — Progress Notes (Signed)
Patient reported to [facility] for Repetitive Transcranial Magnetic Stimulation treatment for severe episode of recurrent major depressive disorder, without psychotic features. Patient presented with appropriate affect, level mood and denied any suicidal or homicidal ideations. Patient denies any other current symptoms and remains optimistic with continued [place] treatment. Patient reported no change in alcohol/substane use, caffeine consumption, sleep pattern or metal implant status since previous treatment. Pt watched tv during sessions. Ptremained at 120% for the duration of treatment after initial increase with first five impulse series.Patient reported no complaints, pain, discomfort or problems with treatment through post out session and completed session with no noted concerns.Patient and husbanddeparted post-treatment with no reported concern.

## 2017-02-23 ENCOUNTER — Other Ambulatory Visit (INDEPENDENT_AMBULATORY_CARE_PROVIDER_SITE_OTHER): Payer: Medicare Other | Admitting: Emergency Medicine

## 2017-02-23 DIAGNOSIS — F332 Major depressive disorder, recurrent severe without psychotic features: Secondary | ICD-10-CM | POA: Diagnosis not present

## 2017-02-23 NOTE — Progress Notes (Signed)
Patient reported to [facility] for Repetitive Transcranial Magnetic Stimulation treatment for severe episode of recurrent major depressive disorder, without psychotic features. Patient presented with appropriate affect, level mood and denied any suicidal or homicidal ideations. Patient denies any other current symptoms and remains optimistic with continued [place] treatment. Patient reported no change in alcohol/substane use, caffeine consumption, sleep pattern or metal implant status since previous treatment. Pt watched tv during sessions. Ptremained at 120% for the duration of treatment after initial increase with first five impulse series.Patient reported no complaints, pain, discomfort or problems with treatment through post out session and completed session with no noted concerns.Patient and husbanddeparted post-treatment with no reported concern.

## 2017-02-26 ENCOUNTER — Other Ambulatory Visit (INDEPENDENT_AMBULATORY_CARE_PROVIDER_SITE_OTHER): Payer: Medicare Other | Admitting: Emergency Medicine

## 2017-02-26 DIAGNOSIS — F332 Major depressive disorder, recurrent severe without psychotic features: Secondary | ICD-10-CM

## 2017-02-26 NOTE — Progress Notes (Addendum)
Patient reported to [facility] for Repetitive Transcranial Magnetic Stimulation treatment for severe episode of recurrent major depressive disorder, without psychotic features. Patient presented with appropriate affect, level mood and denied any suicidal or homicidal ideations. Patient denies any other current symptoms and remains optimistic with continued [place] treatment. Patient reported no change in alcohol/substane use, caffeine consumption, sleep pattern or metal implant status since previous treatment. Pt watched tv during sessions. Pt shared that she has not been feeling well - she had a nightmare last night and had a hard time going to sleep. Pt is stressed and depressed. Writer provided printouts of aromatherapy and acupuncture services - she wants to try anything that can possibly help. Ptremained at 120% for the duration of treatment after initial increase with first five impulse series.Patient reported no complaints, pain, discomfort or problems with treatment through post out session and completed session with no noted concerns.Patient and husbanddeparted post-treatment with no reported concern.

## 2017-02-27 ENCOUNTER — Other Ambulatory Visit (INDEPENDENT_AMBULATORY_CARE_PROVIDER_SITE_OTHER): Payer: Medicare Other | Admitting: Emergency Medicine

## 2017-02-27 DIAGNOSIS — F332 Major depressive disorder, recurrent severe without psychotic features: Secondary | ICD-10-CM | POA: Diagnosis not present

## 2017-02-27 NOTE — Progress Notes (Signed)
Patient reported to [facility] for Repetitive Transcranial Magnetic Stimulation treatment for severe episode of recurrent major depressive disorder, without psychotic features. Patient presented with appropriate affect, level mood and denied any suicidal or homicidal ideations. Patient denies any other current symptoms and remains optimistic with continued [place] treatment. Patient reported no change in alcohol/substane use, caffeine consumption, sleep pattern or metal implant status since previous treatment. Pt stated she called around aromatherapy services - she found a couple that she will like to try. Pt also shared that she spoke with Dr. Debby Bud and he wants to start her on a mediation. He stated that he will be talking to Dr. [name] or Dr. Lovena Le before starting medication trial. Ptremained at 120% for the duration of treatment after initial increase with first five impulse series.Patient reported no complaints, pain, discomfort or problems with treatment through post out session and completed session with no noted concerns.Patient and husbanddeparted post-treatment with no reported concern.

## 2017-02-28 ENCOUNTER — Other Ambulatory Visit (INDEPENDENT_AMBULATORY_CARE_PROVIDER_SITE_OTHER): Payer: Medicare Other | Admitting: Emergency Medicine

## 2017-02-28 DIAGNOSIS — F332 Major depressive disorder, recurrent severe without psychotic features: Secondary | ICD-10-CM | POA: Diagnosis not present

## 2017-02-28 NOTE — Progress Notes (Signed)
Patient reported to [facility] for Repetitive Transcranial Magnetic Stimulation treatment for severe episode of recurrent major depressive disorder, without psychotic features. Patient presented with appropriate affect, level mood and denied any suicidal or homicidal ideations. Patient denies any other current symptoms and remains optimistic with continued [place] treatment. Patient reported no change in alcohol/substane use, caffeine consumption, sleep pattern or metal implant status since previous treatment. Pt forgot to take medication (alprazaloem) this morning. Pt took it during tx session. She said it made sense why her hand was feeling jittery this morning. Pt also started going to a counselor at a facility near [employer]. Pt stated the session was extremely helpful and plan on going there twice a week. Ptremained at 120% for the duration of treatment after initial increase with first five impulse series.Patient reported no complaints, pain, discomfort or problems with treatment through post out session and completed session with no noted concerns.Patient and husbanddeparted post-treatment with no reported concern.

## 2017-03-01 ENCOUNTER — Other Ambulatory Visit (INDEPENDENT_AMBULATORY_CARE_PROVIDER_SITE_OTHER): Payer: Medicare Other | Admitting: Emergency Medicine

## 2017-03-01 DIAGNOSIS — F332 Major depressive disorder, recurrent severe without psychotic features: Secondary | ICD-10-CM

## 2017-03-01 NOTE — Progress Notes (Signed)
Patient reported to [facility] for Repetitive Transcranial Magnetic Stimulation treatment for severe episode of recurrent major depressive disorder, without psychotic features. Patient presented with appropriate affect, level mood and denied any suicidal or homicidal ideations. Patient denies any other current symptoms and remains optimistic with continued [place] treatment. Patient reported no change in alcohol/substane use, caffeine consumption, sleep pattern or metal implant status since previous treatment. Pt watched tv during sessions. Pt stated that she is going to try to learn a new instrument. Pt seemed to be in better spirits.  Ptremained at 120% for the duration of treatment after initial increase with first five impulse series.Patient reported no complaints, pain, discomfort or problems with treatment through post out session and completed session with no noted concerns.Patient and husbanddeparted post-treatment with no reported concern.

## 2017-03-02 ENCOUNTER — Ambulatory Visit (HOSPITAL_COMMUNITY): Payer: Medicare Other | Admitting: Psychiatry

## 2017-03-02 ENCOUNTER — Other Ambulatory Visit (INDEPENDENT_AMBULATORY_CARE_PROVIDER_SITE_OTHER): Payer: Medicare Other | Admitting: Emergency Medicine

## 2017-03-02 DIAGNOSIS — F332 Major depressive disorder, recurrent severe without psychotic features: Secondary | ICD-10-CM | POA: Diagnosis not present

## 2017-03-02 NOTE — Progress Notes (Signed)
Patient reported to [facility] for Repetitive Transcranial Magnetic Stimulation treatment for severe episode of recurrent major depressive disorder, without psychotic features. Patient presented with appropriate affect, level mood and denied any suicidal or homicidal ideations. Patient denies any other current symptoms and remains optimistic with continued [place] treatment. Patient reported no change in alcohol/substane use, caffeine consumption, sleep pattern or metal implant status since previous treatment. Pt was in good spirits today. Pt watched tv during sessions.Ptremained at 120% for the duration of treatment after initial increase with first five impulse series.Patient reported no complaints, pain, discomfort or problems with treatment through post out session and completed session with no noted concerns.Patient and husbanddeparted post-treatment with no reported concern.

## 2017-03-05 ENCOUNTER — Other Ambulatory Visit (INDEPENDENT_AMBULATORY_CARE_PROVIDER_SITE_OTHER): Payer: Medicare Other | Admitting: Emergency Medicine

## 2017-03-05 ENCOUNTER — Ambulatory Visit: Payer: Medicare Other | Admitting: Neurology

## 2017-03-05 DIAGNOSIS — F332 Major depressive disorder, recurrent severe without psychotic features: Secondary | ICD-10-CM

## 2017-03-05 NOTE — Progress Notes (Signed)
F33.2 Patient reported to [facility] for Repetitive Transcranial Magnetic Stimulation treatment for severe episode of recurrent major depressive disorder, without psychotic features. Patient presented with appropriate affect, level mood and denied any suicidal or homicidal ideations. Patient denies any other current symptoms and remains optimistic with continued [place] treatment. Patient reported no change in alcohol/substane use, caffeine consumption, sleep pattern or metal implant status since previous treatment. Pt watched tv during sessions. Pt shared that she had a good weekend. Ptremained at 120% for the duration of treatment after initial increase with first five impulse series.Patient reported no complaints, pain, discomfort or problems with treatment through post out session and completed session with no noted concerns.Patient and husbanddeparted post-treatment with no reported concern.

## 2017-03-06 ENCOUNTER — Other Ambulatory Visit (INDEPENDENT_AMBULATORY_CARE_PROVIDER_SITE_OTHER): Payer: Medicare Other | Admitting: Emergency Medicine

## 2017-03-06 DIAGNOSIS — F332 Major depressive disorder, recurrent severe without psychotic features: Secondary | ICD-10-CM

## 2017-03-06 NOTE — Progress Notes (Signed)
Patient reported to [facility] for Repetitive Transcranial Magnetic Stimulation treatment for severe episode of recurrent major depressive disorder, without psychotic features. Patient presented with appropriate affect, level mood and denied any suicidal or homicidal ideations. Patient denies any other current symptoms and remains optimistic with continued [place] treatment. Patient reported no change in alcohol/substane use, caffeine consumption, sleep pattern or metal implant status since previous treatment. Pt walked in very teary - pt stated she has not been having a good day. Pt stated yesterday afternoon she had a bad headache. Writer provided with support and encouragement.Ptremained at 120% for the duration of treatment after initial increase with first five impulse series.Patient reported no complaints, pain, discomfort or problems with treatment through post out session and completed session with no noted concerns.Patient and husbanddeparted post-treatment with no reported concern.

## 2017-03-07 ENCOUNTER — Encounter (HOSPITAL_COMMUNITY): Payer: Self-pay | Admitting: Psychiatry

## 2017-03-07 ENCOUNTER — Other Ambulatory Visit (INDEPENDENT_AMBULATORY_CARE_PROVIDER_SITE_OTHER): Payer: Medicare Other | Admitting: Emergency Medicine

## 2017-03-07 DIAGNOSIS — F332 Major depressive disorder, recurrent severe without psychotic features: Secondary | ICD-10-CM

## 2017-03-07 NOTE — Progress Notes (Signed)
Patient reported to [facility] for Repetitive Transcranial Magnetic Stimulation treatment for severe episode of recurrent major depressive disorder, without psychotic features. Patient presented with appropriate affect, level mood and denied any suicidal or homicidal ideations. Patient denies any other current symptoms and remains optimistic with continued [place] treatment. Patient reported no change in alcohol/substane use, caffeine consumption, sleep pattern or metal implant status since previous treatment. Pt was provided with encouragement and support. Patient reported no complaints, pain, discomfort or problems with treatment through post out session and completed session with no noted concerns.Patient and husbanddeparted post-treatment with no reported concern.

## 2017-03-08 ENCOUNTER — Other Ambulatory Visit (INDEPENDENT_AMBULATORY_CARE_PROVIDER_SITE_OTHER): Payer: Medicare Other | Admitting: Emergency Medicine

## 2017-03-08 DIAGNOSIS — F332 Major depressive disorder, recurrent severe without psychotic features: Secondary | ICD-10-CM | POA: Diagnosis not present

## 2017-03-08 NOTE — Progress Notes (Signed)
Patient reported to [facility] for Repetitive Transcranial Magnetic Stimulation treatment for severe episode of recurrent major depressive disorder, without psychotic features. Patient presented with appropriate affect, level mood and denied any suicidal or homicidal ideations. Patient denies any other current symptoms and remains optimistic with continued [place] treatment. Patient reported no change in alcohol/substane use, caffeine consumption, sleep pattern or metal implant status since previous treatment. Pt contacted Dr. Ezzie Dural in regards to medication change and [place]. Pt shared she did not like how Dr. Ezzie Dural spoke to her on the phone. Pt was told that she can contact Dr. [name] in regards to any questions she has about [place]. Pt contacted him via email.  Patient reported no complaints, pain, discomfort or problems with treatment through post out session and completed session with no noted concerns.Patient and husbanddeparted post-treatment with no reported concern.

## 2017-03-09 ENCOUNTER — Other Ambulatory Visit (INDEPENDENT_AMBULATORY_CARE_PROVIDER_SITE_OTHER): Payer: Medicare Other | Admitting: Emergency Medicine

## 2017-03-09 DIAGNOSIS — F332 Major depressive disorder, recurrent severe without psychotic features: Secondary | ICD-10-CM | POA: Diagnosis not present

## 2017-03-09 NOTE — Progress Notes (Signed)
Patient reported to [facility] for Repetitive Transcranial Magnetic Stimulation treatment for severe episode of recurrent major depressive disorder, without psychotic features. Patient presented with appropriate affect, level mood and denied any suicidal or homicidal ideations. Patient denies any other current symptoms and remains optimistic with continued [place] treatment. Patient reported no change in alcohol/substane use, caffeine consumption, sleep pattern or metal implant status since previous treatment. Pt was extremely teary today. Writer provided patient with support and encouragement.  Patient reported no complaints, pain, discomfort or problems with treatment through post out session and completed session with no noted concerns.Patient and husbanddeparted post-treatment with no reported concern.

## 2017-03-12 ENCOUNTER — Ambulatory Visit (INDEPENDENT_AMBULATORY_CARE_PROVIDER_SITE_OTHER): Payer: Medicare Other | Admitting: Licensed Clinical Social Worker

## 2017-03-12 ENCOUNTER — Other Ambulatory Visit (INDEPENDENT_AMBULATORY_CARE_PROVIDER_SITE_OTHER): Payer: Medicare Other | Admitting: Emergency Medicine

## 2017-03-12 DIAGNOSIS — F332 Major depressive disorder, recurrent severe without psychotic features: Secondary | ICD-10-CM | POA: Diagnosis not present

## 2017-03-12 DIAGNOSIS — F331 Major depressive disorder, recurrent, moderate: Secondary | ICD-10-CM | POA: Diagnosis not present

## 2017-03-12 NOTE — Progress Notes (Signed)
Patient reported to [facility] for Repetitive Transcranial Magnetic Stimulation treatment for severe episode of recurrent major depressive disorder, without psychotic features. Patient presented with appropriate affect, level mood and denied any suicidal or homicidal ideations. Patient denies any other current symptoms and remains optimistic with continued [place] treatment. Patient reported no change in alcohol/substane use, caffeine consumption, sleep pattern or metal implant status since previous treatment. Pt shared that she went shopping for aromatherapy items. Pt has started using them this weekend.  Patient reported no complaints, pain, discomfort or problems with treatment through post out session and completed session with no noted concerns.Patient and husbanddeparted post-treatment with no reported concern.

## 2017-03-13 ENCOUNTER — Other Ambulatory Visit (INDEPENDENT_AMBULATORY_CARE_PROVIDER_SITE_OTHER): Payer: Medicare Other | Admitting: Emergency Medicine

## 2017-03-13 DIAGNOSIS — F332 Major depressive disorder, recurrent severe without psychotic features: Secondary | ICD-10-CM | POA: Diagnosis not present

## 2017-03-13 NOTE — Progress Notes (Signed)
Patient reported to [facility] for Repetitive Transcranial Magnetic Stimulation treatment for severe episode of recurrent major depressive disorder, without psychotic features. Patient presented with appropriate affect, level mood and denied any suicidal or homicidal ideations. Patient denies any other current symptoms and remains optimistic with continued [place] treatment. Patient reported no change in alcohol/substane use, caffeine consumption, sleep pattern or metal implant status since previous treatment. Pt watched tv. Pt came into session in a good mood.Patient reported no complaints, pain, discomfort or problems with treatment through post out session and completed session with no noted concerns.Patient and husbanddeparted post-treatment with no reported concern.

## 2017-03-14 ENCOUNTER — Encounter (HOSPITAL_COMMUNITY): Payer: Self-pay

## 2017-03-15 ENCOUNTER — Other Ambulatory Visit (INDEPENDENT_AMBULATORY_CARE_PROVIDER_SITE_OTHER): Payer: Medicare Other | Admitting: Emergency Medicine

## 2017-03-15 DIAGNOSIS — F332 Major depressive disorder, recurrent severe without psychotic features: Secondary | ICD-10-CM | POA: Diagnosis not present

## 2017-03-15 NOTE — Progress Notes (Signed)
Patient reported to [facility] for Repetitive Transcranial Magnetic Stimulation treatment for severe episode of recurrent major depressive disorder, without psychotic features. Patient presented with appropriate affect, level mood and denied any suicidal or homicidal ideations. Patient denies any other current symptoms and remains optimistic with continued [place] treatment. Patient reported no change in alcohol/substane use, caffeine consumption, sleep pattern or metal implant status since previous treatment. Pt watched tv. Pt stated that she is feeling better today. Yesterday she had to cancel her appointment  Because she was having a bad day. She was very teary and mentally exhausted. Pt came into session in a good mood.Patient reported no complaints, pain, discomfort or problems with treatment through post out session and completed session with no noted concerns.Patient and husbanddeparted post-treatment with no reported concern.

## 2017-03-16 ENCOUNTER — Ambulatory Visit (INDEPENDENT_AMBULATORY_CARE_PROVIDER_SITE_OTHER): Payer: Medicare Other | Admitting: Neurology

## 2017-03-16 ENCOUNTER — Encounter: Payer: Self-pay | Admitting: Neurology

## 2017-03-16 ENCOUNTER — Encounter (HOSPITAL_COMMUNITY): Payer: Self-pay | Admitting: Emergency Medicine

## 2017-03-16 VITALS — BP 113/62 | HR 67 | Resp 16 | Ht 59.0 in | Wt 99.0 lb

## 2017-03-16 DIAGNOSIS — M7550 Bursitis of unspecified shoulder: Secondary | ICD-10-CM

## 2017-03-16 DIAGNOSIS — G250 Essential tremor: Secondary | ICD-10-CM | POA: Diagnosis not present

## 2017-03-16 DIAGNOSIS — R229 Localized swelling, mass and lump, unspecified: Secondary | ICD-10-CM | POA: Diagnosis not present

## 2017-03-16 DIAGNOSIS — F418 Other specified anxiety disorders: Secondary | ICD-10-CM | POA: Diagnosis not present

## 2017-03-16 MED ORDER — PROPRANOLOL HCL ER 60 MG PO CP24: 60.0000 mg | ORAL_CAPSULE | Freq: Every day | ORAL | 11 refills | Status: DC

## 2017-03-16 NOTE — Progress Notes (Signed)
[facility]  PATIENT: Tracy Malone DOB: 1932/01/24  REFERRING DOCTOR OR PCP:  Joselyn Arrow SOURCE: patient and EMR records  _________________________________   HISTORICAL  CHIEF COMPLAINT:  Chief Complaint  Patient presents with  . Tremors    Sts.tremor right arm is about the same.  Has noted multiple nodules right arm--would like to discuss./fim    HISTORY OF PRESENT ILLNESS:  Micheal Mathwig is an 83 year old woman with tremor and shoulder pain  Tremor:   She has a tremor that is worse in the right arm the left arm. She notes it is generally worse when she is aggravated or if she tries to hold still such as holding a cup or spoon to her mouth. She also notes it with writing. Alprazolam has helped the tremor some she takes a low dose 0.25 mg 2-3 times a day.   She first noted a right hand tremor in late 2015.    She notes the tremor in the right hand much more than the left.      Tremor is not present when asleep.    She has not noted the tremor in the head.   She has not had any difficulty with her gait or balance. She has had a couple falls but they occurred with tripping. There is no family history of tremors.   Shoulder pain:   The right shoulder pain returned. It is worse when she holds her arm over her head or externally rotates.  Pain is best when she holds the arm into her chest. In the past, we did a subacromial bursa injection and that helped her for 2-3 months. She also received an injection by her orthopedic surgeon that also helped for 2-3 months.   NSAIDs have not helped.  Nodules:   She has a nodule in the right arm.   She has shown her PCP  Mood:  She has noted some crying spells but no laughing spells. The crying spells and sometimes spontaneous often she is sad at that time. The history of depression. She prefers not to be on an antidepressant at this time.   REVIEW OF SYSTEMS: Constitutional: No fevers, chills, sweats, or change in  appetite Eyes: No visual changes, double vision, eye pain Ear, nose and throat: No hearing loss, ear pain, nasal congestion, sore throat Cardiovascular: No chest pain, palpitations.  Has had palpitations Respiratory: No shortness of breath at rest or with exertion.   No wheezes GastrointestinaI: No nausea, vomiting, diarrhea, abdominal pain, fecal incontinence Genitourinary: No dysuria, urinary retention or frequency.  No nocturia. Musculoskeletal: No neck pain, back pain.  She notes right shoulder pain Integumentary: No rash, pruritus, skin lesions Neurological: as above Psychiatric: Some crying spells..  Some anxiety Endocrine: No palpitations, diaphoresis, change in appetite, change in weigh or increased thirst Hematologic/Lymphatic: No anemia, purpura, petechiae. Allergic/Immunologic: No itchy/runny eyes, nasal congestion, recent allergic reactions, rashes  ALLERGIES: Allergies  Allergen Reactions  . Iodine Anaphylaxis  . Levsin [Hyoscyamine Sulfate]     Vision problems/pt has glaucoma  . Salmon [Fish Allergy] Hives and Shortness Of Breath  . Shellfish Allergy Anaphylaxis  . Remeron [Mirtazapine] Other (See Comments)    Cause blurred vision and red eyes, pt has glaucoma  . Aspirin Other (See Comments)    Sever stomach pain due to ulcer scaring.  . Ciprofloxacin Diarrhea  . Codeine Nausea And Vomiting  . Darvocet [Propoxyphene N-Acetaminophen] Nausea And Vomiting  . Demerol [Meperidine] Nausea Only  .  Dexilant [Dexlansoprazole] Swelling  . Diphedryl [Diphenhydramine] Other (See Comments)    Increased pulse/small amount ok  . Doxycycline Hyclate Other (See Comments)    GI intolerance.  Marland Kitchen Epinephrine Other (See Comments)    Breathing problems  . Erythromycin Other (See Comments)    GI intolerance.  [name] [Cyclobenzaprine] Other (See Comments)    Tingly/prickly sensation.  Marland Kitchen Keflex [Cephalexin] Hives  . Latex Other (See Comments)    Gloves ok  . Prednisone Other  (See Comments)    Headache  . Pylera [Bis Subcit-Metronid-Tetracyc] Swelling    Tongue swelling. Face tingling  . Sulfa Antibiotics Other (See Comments)    Increased pulse, fainting, diarrhea, thrush  . Xylocaine [Lidocaine Hcl]     With epinephrine, given by dentist.  Speeded up heart rate and she passed out (occured twice, at dentist)  . Zoloft [Sertraline Hcl] Swelling and Other (See Comments)    Migraine Swelling of tongue/lip (09/2012)  . Advil [Ibuprofen] Other (See Comments)    Motrin ok with a GI effect.  . Clarithromycin Rash    Started after completing 10 day course of 2000 mg /day    HOME MEDICATIONS:  Current Outpatient Prescriptions:  .  acetaminophen (TYLENOL) 650 MG CR tablet, Take 650 mg by mouth as needed for pain. Reported on 09/06/2015, Disp: , Rfl:  .  ALPRAZolam (XANAX) 0.5 MG tablet, Take 0.5 mg by mouth 3 (three) times daily. , Disp: , Rfl:  .  b complex vitamins capsule, Take 1 capsule by mouth as needed. , Disp: , Rfl:  .  Cholecalciferol (VITAMIN D) 2000 UNITS tablet, Take 2,000 Units by mouth daily., Disp: , Rfl:  .  Probiotic Product (ALIGN PO), Take 1 capsule by mouth every other day. , Disp: , Rfl:  .  SYNTHROID 50 MCG tablet, Take 50 mg by mouth every morning., Disp: , Rfl: 4 .  vitamin E 400 UNIT capsule, Take 400 Units by mouth every Monday, Wednesday, and Friday. Reported on 09/06/2015, Disp: , Rfl:  .  propranolol ER (INDERAL LA) 60 MG 24 hr capsule, Take 1 capsule (60 mg total) by mouth daily., Disp: 30 capsule, Rfl: 11  PAST MEDICAL HISTORY: Past Medical History:  Diagnosis Date  . Bell's palsy 1966   ? right side facial droop  . Carotid artery disease (HCC) 2010   on vascular screening;unchanged 2013.(could not tolerate simvastatin, no other statins tried)--<30% blockage bilat 07/2011  . Chronic abdominal pain   . Chronic fatigue and malaise   . Claustrophobia   . Depression    treated in the past for years;stopped in 2010 for a years  .  Duodenal ulcer 1962   h/o  . Fibromyalgia   . Frequent PVCs 07/2012   Seen by [facility]: benign, asymptomatic, normal EF  . GERD (gastroesophageal reflux disease)   . Glaucoma, narrow-angle    s/p laser surgery  . Hyperthyroidism    h/o x 2 years  . Hypothyroid 9/08  . IBS (irritable bowel syndrome)    Dr. Elnoria Howard  . Ocular migraine   . Osteoporosis 10/11   Dr.Hawkes  . Panic attack   . Recurrent UTI    has cystocele-Dr.Grewal  . Shingles 1999   h/o  . SLE (systemic lupus erythematosus) (HCC)    with arthralgia/myalgia, positive ANA centromere pattern, positive DNA, positive RNP, indeterminant anti-cardiolipin antibody-Dr.Hawkes.  . Superficial thrombophlebitis 03/2009   RLE  . Trochanteric bursitis 12/2008   bilateral    PAST SURGICAL HISTORY: Past  Surgical History:  Procedure Laterality Date  . ABDOMINAL HYSTERECTOMY    . CATARACT EXTRACTION, BILATERAL  1995, 1996  . Flexible sigmoidoscopy    . THYROIDECTOMY, PARTIAL  09/2005   L nodule; Dr. Gerrit Friends  . TONSILLECTOMY  1946  . UPPER GI ENDOSCOPY  06/27/12  . VAGINAL HYSTERECTOMY  1971   and bladder repair.  Still has ovaries    FAMILY HISTORY: Family History  Problem Relation Age of Onset  . Heart disease Mother   . Hypertension Mother   . Hypertension Sister   . Heart disease Brother   . Lung cancer Brother        lung  . Diabetes Maternal Grandfather     SOCIAL HISTORY:  Social History   Social History  . Marital status: Married    Spouse name: N/A  . Number of children: 2  . Years of education: N/A   Occupational History  . retired (school system)    Social History Main Topics  . Smoking status: Never Smoker  . Smokeless tobacco: Never Used  . Alcohol use No  . Drug use: No  . Sexual activity: No   Other Topics Concern  . Not on file   Social History Narrative   Married.  Son lives in [place]; Daughter [name] lives in [place]; 2 grandchildren     PHYSICAL EXAM  Vitals:   03/16/17 0855   BP: 113/62  Pulse: 67  Resp: 16  Weight: 99 lb (44.9 kg)  Height: 4\' 11"  (1.499 m)    Body mass index is 20 kg/m.   General: The patient is well-developed and well-nourished and in no acute distress  Musculoskeletal:  There is tenderness over the right subacromial bursa and she has a reduced range of motion of the right shoulder, limited by pain.  Skin:   No edema.    There were a couple small subcutaneous nodules in the right arm. .    Neurologic Exam  Mental status: The patient is alert and oriented x 3 at the time of the examination. The patient has apparent normal recent and remote memory, with an apparently normal attention span and concentration ability.   Speech is normal.  Cranial nerves: Extraocular movements are full.   Facial strength is normal.  Trapezius and sternocleidomastoid strength is normal. No dysarthria is noted.    Motor:  There is no bradykinesia. She has a 6-7 Hz tremor worse in the right arm with sustained posture/intention..   Muscle bulk is normal.   Tone is normal. No cogwheeling.  Strength is  5 / 5 in all 4 extremities.   Sensory: Sensory testing is intact to touch and vibration sensation in all 4 extremities.  Coordination: Cerebellar testing reveals good finger-nose-finger bilaterally.  Gait and station: Station is normal.   Gait is normal for age. Tandem gait is normal. There is no significant retropulsion.. Romberg is negative.   Reflexes: Deep tendon reflexes are symmetric and normal bilaterally.    DIAGNOSTIC DATA (LABS, IMAGING, TESTING) - I reviewed patient records, labs, notes, testing and imaging myself where available.  Lab Results  Component Value Date   WBC 8.7 11/23/2016   HGB 13.6 11/23/2016   HCT 41.5 11/23/2016   MCV 89.6 11/23/2016   PLT 257 11/23/2016      Component Value Date/Time   NA 143 11/23/2016 0815   K 4.2 11/23/2016 0815   CL 106 11/23/2016 0815   CO2 26 11/23/2016 0815   GLUCOSE 85 11/23/2016 0815  BUN  16 11/23/2016 0815   CREATININE 0.67 11/23/2016 0815   CALCIUM 9.2 11/23/2016 0815   PROT 6.6 11/23/2016 0815   ALBUMIN 3.8 11/23/2016 0815   AST 20 11/23/2016 0815   ALT 12 11/23/2016 0815   ALKPHOS 60 11/23/2016 0815   BILITOT 1.3 (H) 11/23/2016 0815   GFRNONAA 77 (L) 07/04/2013 1045   GFRAA 90 (L) 07/04/2013 1045   Lab Results  Component Value Date   CHOL 218 (H) 11/23/2016   HDL 78 11/23/2016   LDLCALC 115 (H) 11/23/2016   TRIG 123 11/23/2016   CHOLHDL 2.8 11/23/2016   No results found for: HGBA1C No results found for: VITAMINB12 Lab Results  Component Value Date   TSH 1.81 11/23/2016       ASSESSMENT AND PLAN  Essential tremor  Subacromial bursitis  Depression with anxiety  Subcutaneous nodules  1.   She will continue Xanax for tremor.I will add a low-dose of propranolol to see if that helps better. She does not appear to have Parkinson's as there is no bradykinesia, cogwheeling or significant gait disturbance.   2.   Inject right subacromial bursa with 40 mg Depo-Medrol in 2.5 cc Marcaine using sterile technique.    She tolerated the procedure well.   Pain was much better afterwards. 3.    I offered to have her try low dose of an SSRI for depression but she would prefer to wait at this point she asked about PBA. I do not think that she has that. 4.   Advised to follow up with her primary care physician or dermatologist if the subcutaneous nodules worsen.    They may be lipomas but I let her know that this is well outside of my field of knowledge  She will return to see me in 12 months or call sooner if there are new or worsening neurologic symptoms.   Ariya Bohannon A. Epimenio Foot, MD, PhD 03/16/2017, 9:28 AM Certified in Neurology, Clinical Neurophysiology, Sleep Medicine, Pain Medicine and Neuroimaging  [facility] [address], [address] [place], [place] [address] [phone]

## 2017-03-17 ENCOUNTER — Encounter: Payer: Self-pay | Admitting: Family Medicine

## 2017-03-18 ENCOUNTER — Encounter: Payer: Self-pay | Admitting: Family Medicine

## 2017-03-19 ENCOUNTER — Other Ambulatory Visit (INDEPENDENT_AMBULATORY_CARE_PROVIDER_SITE_OTHER): Payer: Medicare Other | Admitting: Emergency Medicine

## 2017-03-19 DIAGNOSIS — F332 Major depressive disorder, recurrent severe without psychotic features: Secondary | ICD-10-CM

## 2017-03-19 DIAGNOSIS — F411 Generalized anxiety disorder: Secondary | ICD-10-CM | POA: Diagnosis not present

## 2017-03-19 NOTE — Progress Notes (Signed)
Patient reported to [facility] for Repetitive Transcranial Magnetic Stimulation treatment for severe episode of recurrent major depressive disorder, without psychotic features. Patient presented with appropriate affect, level mood and denied any suicidal or homicidal ideations. Patient denies any other current symptoms and remains optimistic with continued [place] treatment. Patient reported no change in alcohol/substane use, caffeine consumption, sleep pattern or metal implant status since previous treatment. Pt watched tv. Pt shared that she did not have a good night last night. Pt stated that she Patient reported no complaints, pain, discomfort or problems with treatment through post out session and completed session with no noted concerns.Patient and husbanddeparted post-treatment with no reported concern.

## 2017-03-20 ENCOUNTER — Other Ambulatory Visit (INDEPENDENT_AMBULATORY_CARE_PROVIDER_SITE_OTHER): Payer: Medicare Other | Admitting: Emergency Medicine

## 2017-03-20 DIAGNOSIS — F332 Major depressive disorder, recurrent severe without psychotic features: Secondary | ICD-10-CM | POA: Diagnosis not present

## 2017-03-20 NOTE — Progress Notes (Signed)
Patient reported to [facility] for Repetitive Transcranial Magnetic Stimulation treatment for severe episode of recurrent major depressive disorder, without psychotic features. Patient presented with appropriate affect, level mood and denied any suicidal or homicidal ideations. Patient denies any other current symptoms and remains optimistic with continued [place] treatment. Patient reported no change in alcohol/substane use, caffeine consumption, sleep pattern or metal implant status since previous treatment. Pt was feeling better today. Pt is looking forward to going to her peer specialist appointment, Pt came into session in a good mood.Patient reported no complaints, pain, discomfort or problems with treatment through post out session and completed session with no noted concerns.Patient and husbanddeparted post-treatment with no reported concern.

## 2017-03-21 ENCOUNTER — Other Ambulatory Visit (INDEPENDENT_AMBULATORY_CARE_PROVIDER_SITE_OTHER): Payer: Medicare Other | Admitting: Emergency Medicine

## 2017-03-21 DIAGNOSIS — F332 Major depressive disorder, recurrent severe without psychotic features: Secondary | ICD-10-CM | POA: Diagnosis not present

## 2017-03-21 NOTE — Progress Notes (Signed)
Patient reported to [facility] for Repetitive Transcranial Magnetic Stimulation treatment for severe episode of recurrent major depressive disorder, without psychotic features. Patient presented with appropriate affect, level mood and denied any suicidal or homicidal ideations. Patient denies any other current symptoms and remains optimistic with continued [place] treatment. Patient reported no change in alcohol/substane use, caffeine consumption, sleep pattern or metal implant status since previous treatment. Pt watched tv. Pt stated that she had a good session with her peer specialist. Pt shared that she will be starting a new medication (propranolol) that was recommended by her neurologist, [name]. Pt is hopeful and excited about taking this medication. Pt stated that she Patient reported no complaints, pain, discomfort or problems with treatment through post out session and completed session with no noted concerns.Patient and husbanddeparted post-treatment with no reported concern.

## 2017-03-22 ENCOUNTER — Other Ambulatory Visit (INDEPENDENT_AMBULATORY_CARE_PROVIDER_SITE_OTHER): Payer: Medicare Other | Admitting: Emergency Medicine

## 2017-03-22 ENCOUNTER — Encounter (HOSPITAL_COMMUNITY): Payer: Self-pay

## 2017-03-22 ENCOUNTER — Encounter: Payer: Self-pay | Admitting: Podiatry

## 2017-03-22 ENCOUNTER — Ambulatory Visit (INDEPENDENT_AMBULATORY_CARE_PROVIDER_SITE_OTHER): Payer: Medicare Other | Admitting: Podiatry

## 2017-03-22 ENCOUNTER — Encounter (HOSPITAL_COMMUNITY): Payer: Self-pay | Admitting: Psychiatry

## 2017-03-22 DIAGNOSIS — B351 Tinea unguium: Secondary | ICD-10-CM | POA: Diagnosis not present

## 2017-03-22 DIAGNOSIS — F332 Major depressive disorder, recurrent severe without psychotic features: Secondary | ICD-10-CM

## 2017-03-22 DIAGNOSIS — M79676 Pain in unspecified toe(s): Secondary | ICD-10-CM | POA: Diagnosis not present

## 2017-03-22 DIAGNOSIS — Q828 Other specified congenital malformations of skin: Secondary | ICD-10-CM

## 2017-03-22 NOTE — Progress Notes (Signed)
Patient reported to [facility] for Repetitive Transcranial Magnetic Stimulation treatment for severe episode of recurrent major depressive disorder, without psychotic features. Patient presented with appropriate affect, level mood and denied any suicidal or homicidal ideations. Patient denies any other current symptoms and remains optimistic with continued [place] treatment. Patient reported no change in alcohol/substane use, caffeine consumption, sleep pattern or metal implant status since previous treatment. Pt brought her daughter to [place] treatment today. Pt watched tv during session. Pt explained to her daughter about [place] and the benefits she had experienced. Patient reported no complaints, pain, discomfort or problems with treatment through post out session and completed session with no noted concerns.Patient and her daughterdeparted post-treatment with no reported concern.

## 2017-03-22 NOTE — Progress Notes (Signed)
She presents today with a chief complaint of painful toenails and calluses bilaterally.  Objective: Vital signs are stable she is alert and oriented 3. Toenails are long thick yellow dystrophic with mycotic and painful on palpation. Multiple reactive hyperkeratosis plantar aspect of the bilateral foot porokeratotic in nature.  Assessment: Porokeratosis and payments. Onychomycosis.  Plan: Debridement of toenails 1 through 5 bilateral. Porokeratosis were debrided as well.

## 2017-03-23 ENCOUNTER — Other Ambulatory Visit (INDEPENDENT_AMBULATORY_CARE_PROVIDER_SITE_OTHER): Payer: Medicare Other | Admitting: Emergency Medicine

## 2017-03-23 DIAGNOSIS — F332 Major depressive disorder, recurrent severe without psychotic features: Secondary | ICD-10-CM | POA: Diagnosis not present

## 2017-03-23 NOTE — Progress Notes (Signed)
Patient reported to [facility] for Repetitive Transcranial Magnetic Stimulation treatment for severe episode of recurrent major depressive disorder, without psychotic features. Patient presented with appropriate affect, level mood and denied any suicidal or homicidal ideations. Patient denies any other current symptoms and remains optimistic with continued [place] treatment. Patient reported no change in alcohol/substane use, caffeine consumption, sleep pattern or metal implant status since previous treatment. Pt watched tv. Pt stated that she had evening yesterday. Pt shared that her aromatherapy has been helping her. Pt stated that she Patient reported no complaints, pain, discomfort or problems with treatment through post out session and completed session with no noted concerns.Patient and husbanddeparted post-treatment with no reported concern.

## 2017-03-26 ENCOUNTER — Encounter (HOSPITAL_COMMUNITY): Payer: Self-pay

## 2017-03-27 ENCOUNTER — Telehealth: Payer: Self-pay | Admitting: Neurology

## 2017-03-27 ENCOUNTER — Other Ambulatory Visit (HOSPITAL_COMMUNITY): Payer: Medicare Other | Attending: Psychiatry | Admitting: Emergency Medicine

## 2017-03-27 ENCOUNTER — Encounter: Payer: Self-pay | Admitting: Family Medicine

## 2017-03-27 DIAGNOSIS — F332 Major depressive disorder, recurrent severe without psychotic features: Secondary | ICD-10-CM | POA: Diagnosis not present

## 2017-03-27 NOTE — Telephone Encounter (Signed)
Pt called said propranolol ER (INDERAL LA) 60 MG 24 hr capsule is making her sleepy, increased tiredness, exhausted. She is wanting to know if will be ok to take it at bedtime. She has noticed her stomach hurts about 3 hrs after taking (she is taking with food) and she gets chills if she is touched by someone although this did not happen last night. The tremor did get better 2 days after starting but it is still there. These are the only effects she has noticed.  She read the pamphlet it said to have BP checked periodically, does she need to do this? Pt said she has [place] treatment at 1:00 today and will be available prior to that or after 2:30.

## 2017-03-27 NOTE — Telephone Encounter (Signed)
Pt called back, she has monitored BP this afternoon. It is running 127/51  Pulse 50-51 (normally 72). She said diastolic is usually around 70-79. FYI

## 2017-03-27 NOTE — Progress Notes (Signed)
Patient reported to [facility] for Repetitive Transcranial Magnetic Stimulation treatment for severe episode of recurrent major depressive disorder, without psychotic features. Patient presented with appropriate affect, level mood and denied any suicidal or homicidal ideations. Patient denies any other current symptoms and remains optimistic with continued [place] treatment. Patient reported no change in alcohol/substane use, caffeine consumption, sleep pattern or metal implant status since previous treatment. Pt watched tv. Pt stated that she is having trouble with her medications. Pt reported that she will be discussing with Dr. [name] tomorrow.Pt stated that she Patient reported no complaints, pain, discomfort or problems with treatment through post out session and completed session with no noted concerns.Patient and husbanddeparted post-treatment with no reported concern.

## 2017-03-27 NOTE — Telephone Encounter (Signed)
Spoke with pt. and explained BP is normal, HR is low due to Inderal.  She should watch for dizziness, feeling as if she is going to pass out, shortness of breath,any other new sx. that causes concern, and call back.  She verbalized understanding of same./fim

## 2017-03-27 NOTE — Telephone Encounter (Signed)
I have spoken with pt. this am and advised ok to take Inderal at night.  Fatigue is a known side effect and hopefully will improved.  She will monitor BP at home to make sure BP is not low, and will call back if it is. Will use otc MOM for gi issues, which is also a known side effect of Inderal. If gi issues worsen, she will call back/fim

## 2017-03-28 ENCOUNTER — Encounter (HOSPITAL_COMMUNITY): Payer: Self-pay | Admitting: Psychiatry

## 2017-03-28 ENCOUNTER — Encounter (HOSPITAL_COMMUNITY): Payer: Self-pay | Admitting: Emergency Medicine

## 2017-03-28 ENCOUNTER — Ambulatory Visit (INDEPENDENT_AMBULATORY_CARE_PROVIDER_SITE_OTHER): Payer: Medicare Other | Admitting: Psychiatry

## 2017-03-28 VITALS — BP 112/72 | HR 55 | Ht 59.0 in | Wt 106.0 lb

## 2017-03-28 DIAGNOSIS — G20 Parkinson's disease: Secondary | ICD-10-CM | POA: Diagnosis not present

## 2017-03-28 DIAGNOSIS — F329 Major depressive disorder, single episode, unspecified: Secondary | ICD-10-CM | POA: Diagnosis not present

## 2017-03-28 NOTE — Progress Notes (Signed)
BH MD/PA/NP OP Progress Note  03/28/2017 1:04 PM Tracy Malone  MRN:  [id number]  Chief Complaint:  Chief Complaint    Follow-up     HPI: Tracy Malone is an 83 year old female with a 3 year history of depression, with treatment resistance. She completed 1 round of [place] and 2017 and had about 3 months of remission, and is currently participating in a repeat round of [place]. This is my first interaction with the patient, and I spent time getting to know her in here about her trajectory in terms of her psychiatric illness.   Upon meeting the patient, I noted that she walks with a shuffled gait and leaning forward. Within moments of sitting with her, it became clear that she had a persistent right-sided pill-rolling tremor. Her face was masked throughout our interaction. She took out some notes with regard to questions that she had, and it was clear from her notes that she has micrographia.  I spent time with the patient asking her about the onset of her depression symptoms, and she reports that it's been about 3 years, and she doesn't understand why she has been suddenly depressed over the past few years. She is never had any lifelong struggle with depression, nor has she been psychiatrically hospitalized. She reports that she had some mild depressive symptoms when she was a teenager, but it was never treated and it went away on its own.   She reports that over the past 3 or 4 years, she was also diagnosed with a benign essential tremor. She absolutely never had any tremor prior to this, and she has no family history of benign essential tremor.  She is also noted that over the past 3 years, she has started to experience hallucinations, and notes that just the other day, she thought that she saw all the strange man walking down the street, but when she looked again, the image disappeared. She is also had experiences of hallucinating early in the morning when she is just waking up. She reports that she  also dreams quite vividly now which she did not used to do in the past.  She reports that her husband has remarked that over the past few years her voices become more quiet, and her voices also become more raspy. She and husband both agree that her gait has become shorter and slower, and she often feels stiff.  She has multiple unexplained nerve-like pains throughout her body.  The patient was agreeable to brief neurological examination, and on my exam she had cogwheeling in her right upper extremity, and on finger tap, the amplitude of her tapping became precipitously lower as she was asked to speed up. She has somewhat of a fixed posture when she is sitting, and has substantial physical difficulty getting up, not due to any other etiology or arthropathy.  I spent time reflecting to the patient that over the past 3-4 years she has had the sudden onset of tremor, the sudden onset of treatment resistant depression, bouts of tearfulness, masked and flat affect in her face, decreased size of writing, shuffling gait, hallucinations, and unexplained peripheral pains. She reports that this is correct. I spent time educating her about Parkinson's disease and my clinical impression that she has a history very consistent with Parkinson's disease.  She reports that she and her husband have been worried about the same, and I spent time educating her on the psychiatric manifestations of Parkinson's disease, and my recommendation that she seek a  second opinion from a neurologist, and I have made a referral for her to have a neurology examination with Maryanna Shape.    She expressed relief to have some sort of a sense of an answer. I expressed my recommendation that she gradually taper Xanax with her outpatient provider, and my recommendation that she talk with her neurology provider about Sinemet.   With regard to [place], I recommended that we discontinue treatment, as she has not acrued benefit over the past few weeks, and I  suspect that her depression is due to medical illness of parkinsons disease.  She and her husband were agreeable to this, and were hopeful that we could discontinue treatment with TMS given the lack of benefit.    Visit Diagnosis:    ICD-10-CM   1. Parkinson's disease ([place]) [facility] referral to Neurology    Past Psychiatric History: See intake H&P for full details. Reviewed, with no updates at this time.   Past Medical History:  Past Medical History:  Diagnosis Date  . Bell's palsy 1966   ? right side facial droop  . Carotid artery disease ([place]) 2010   on vascular screening;unchanged 2013.(could not tolerate simvastatin, no other statins tried)--<30% blockage bilat 07/2011  . Chronic abdominal pain   . Chronic fatigue and malaise   . Claustrophobia   . Depression    treated in the past for years;stopped in 2010 for a years  . Duodenal ulcer 1962   h/o  . Fibromyalgia   . Frequent PVCs 07/2012   Seen by [facility]: benign, asymptomatic, normal EF  . GERD (gastroesophageal reflux disease)   . Glaucoma, narrow-angle    s/p laser surgery  . Hyperthyroidism    h/o x 2 years  . Hypothyroid 9/08  . IBS (irritable bowel syndrome)    Dr. Benson Norway  . Ocular migraine   . Osteoporosis 10/11   Dr.Hawkes  . Panic attack   . Recurrent UTI    has cystocele-Dr.Grewal  . Shingles 1999   h/o  . SLE (systemic lupus erythematosus) (HCC)    with arthralgia/myalgia, positive ANA centromere pattern, positive DNA, positive RNP, indeterminant anti-cardiolipin antibody-Dr.Hawkes.  . Superficial thrombophlebitis 03/2009   RLE  . Trochanteric bursitis 12/2008   bilateral    Past Surgical History:  Procedure Laterality Date  . ABDOMINAL HYSTERECTOMY    . CATARACT EXTRACTION, BILATERAL  1995, 1996  . Flexible sigmoidoscopy    . THYROIDECTOMY, PARTIAL  09/2005   L nodule; Dr. Harlow Asa  . TONSILLECTOMY  1946  . UPPER GI ENDOSCOPY  06/27/12  . VAGINAL HYSTERECTOMY  1971   and bladder repair.   Still has ovaries    Family Psychiatric History: See intake H&P for full details. Reviewed, with no updates at this time.   Family History:  Family History  Problem Relation Age of Onset  . Heart disease Mother   . Hypertension Mother   . Hypertension Sister   . Heart disease Brother   . Lung cancer Brother        lung  . Diabetes Maternal Grandfather     Social History:  Social History   Social History  . Marital status: Married    Spouse name: N/A  . Number of children: 2  . Years of education: N/A   Occupational History  . retired (school system)    Social History Main Topics  . Smoking status: Never Smoker  . Smokeless tobacco: Never Used  . Alcohol use No  . Drug use:  No  . Sexual activity: No   Other Topics Concern  . None   Social History Narrative   Married.  Son lives in [place]; Daughter [name] lives in [place]; 2 grandchildren    Allergies:  Allergies  Allergen Reactions  . Iodine Anaphylaxis  . Levsin [Hyoscyamine Sulfate]     Vision problems/pt has glaucoma  . [place] [Fish Allergy] Hives and Shortness Of Breath  . Shellfish Allergy Anaphylaxis  . Remeron [Mirtazapine] Other (See Comments)    Cause blurred vision and red eyes, pt has glaucoma  . Aspirin Other (See Comments)    Sever stomach pain due to ulcer scaring.  . Ciprofloxacin Diarrhea  . Codeine Nausea And Vomiting  . Darvocet [Propoxyphene N-Acetaminophen] Nausea And Vomiting  . Demerol [Meperidine] Nausea Only  . Dexilant [Dexlansoprazole] Swelling  . Diphedryl [Diphenhydramine] Other (See Comments)    Increased pulse/small amount ok  . Doxycycline Hyclate Other (See Comments)    GI intolerance.  Marland Kitchen Epinephrine Other (See Comments)    Breathing problems  . Erythromycin Other (See Comments)    GI intolerance.  Yvette Rack [Cyclobenzaprine] Other (See Comments)    Tingly/prickly sensation.  Marland Kitchen Keflex [Cephalexin] Hives  . Latex Other (See Comments)    Gloves ok  . Prednisone Other  (See Comments)    Headache  . Pylera [Bis Subcit-Metronid-Tetracyc] Swelling    Tongue swelling. Face tingling  . Sulfa Antibiotics Other (See Comments)    Increased pulse, fainting, diarrhea, thrush  . Xylocaine [Lidocaine Hcl]     With epinephrine, given by dentist.  Speeded up heart rate and she passed out (occured twice, at dentist)  . Zoloft [Sertraline Hcl] Swelling and Other (See Comments)    Migraine Swelling of tongue/lip (09/2012)  . Advil [Ibuprofen] Other (See Comments)    Motrin ok with a GI effect.  . Clarithromycin Rash    Started after completing 10 day course of 2000 mg /day    Metabolic Disorder Labs: No results found for: HGBA1C, MPG No results found for: PROLACTIN Lab Results  Component Value Date   CHOL 218 (H) 11/23/2016   TRIG 123 11/23/2016   HDL 78 11/23/2016   CHOLHDL 2.8 11/23/2016   VLDL 25 11/23/2016   LDLCALC 115 (H) 11/23/2016   LDLCALC 121 05/04/2015   Lab Results  Component Value Date   TSH 1.81 11/23/2016   TSH 1.817 02/01/2015    Therapeutic Level Labs: No results found for: LITHIUM No results found for: VALPROATE No components found for:  CBMZ  Current Medications: Current Outpatient Prescriptions  Medication Sig Dispense Refill  . acetaminophen (TYLENOL) 650 MG CR tablet Take 650 mg by mouth as needed for pain. Reported on 09/06/2015    . ALPRAZolam (XANAX) 0.5 MG tablet Take 0.5 mg by mouth 3 (three) times daily.     Marland Kitchen b complex vitamins capsule Take 1 capsule by mouth as needed.     . Cholecalciferol (VITAMIN D) 2000 UNITS tablet Take 2,000 Units by mouth daily.    . Probiotic Product (ALIGN PO) Take 1 capsule by mouth every other day.     Marland Kitchen SYNTHROID 50 MCG tablet Take 50 mg by mouth every morning.  4  . vitamin E 400 UNIT capsule Take 400 Units by mouth every Monday, Wednesday, and Friday. Reported on 09/06/2015     No current facility-administered medications for this visit.      Musculoskeletal: Strength & Muscle Tone:  cogwheel Gait & Station: shuffle Patient leans: [place]  Specialty Exam: ROS  Blood pressure 112/72, pulse (!) 55, height 4\' 11"  (1.499 m), weight 106 lb (48.1 kg).Body mass index is 21.41 kg/m.  General Appearance: Casual and flat affect  Eye Contact:  Fair  Speech:  Normal Rate  Volume:  Decreased  Mood:  "I dont feel sad but I cry sometimes"  Affect:  Flat  Thought Process:  Coherent and Goal Directed  Orientation:  Full (Time, Place, and Person)  Thought Content: Logical   Suicidal Thoughts:  No  Homicidal Thoughts:  No  Memory:  Immediate;   Fair  Judgement:  Fair  Insight:  Fair  Psychomotor Activity:  Shuffling Gait and Tremor  Concentration:  Concentration: Fair  Recall:  [place] of Knowledge: Fair  Language: Good  Akathisia:  Negative  Handed:  Right  AIMS (if indicated): not done  Assets:  Communication Skills Desire for Improvement Financial Resources/Insurance Housing Intimacy Social Support Transportation  ADL's:  Intact  Cognition: WNL  Sleep:  Fair   Screenings: PHQ2-9     Office Visit from 09/26/2016 in [facility] for Infectious Disease Office Visit from 05/04/2015 in [facility] Visit from 03/04/2015 in [facility] for Infectious Disease Office Visit from 01/27/2015 in [facility] for Infectious Disease  PHQ-2 Total Score  0  2  0  0       Assessment and Plan: TAHIRAH SARA is an 83 year old female with an approximately 3-4 year history of treatment resistant depression, since the age of 12. This has coincided with a gradual decline in her gait, the development of shuffling gait, right-sided pill-rolling tremor, bradykinesia and cogwheel rigidity, decrease in the volume of her speech, masking of her face, onset of periodic visual hallucinations (nonpsychotic), and various somatic complaints. On my examination, she presents with a clinical and physical examination history  consistent with Parkinson's disease. I have expressed my concerns to the patient and my recommendation that they seek neurology consultation and second opinion. She is previously failed treatment with [place], achieving only 3 months of remission from her depressive symptoms. She is currently approximately halfway through her current [place] treatment, and has yet to accrue benefit, I recommended that we discontinue treatment in light of this new clinical information and presentation.  No acute safety issues at this time, and she can follow up with her outpatient psychiatrist as scheduled.  I provided a neurology consultation and phone number for her to schedule an appointment at her convenience.   1. Parkinson's disease ([place])    Suspect depression is secondary to medical illness  Referral to neurology Discontinue [place] Continued outpatient psychiatric medication management per primary psychiatrist  Aundra Dubin, MD 03/28/2017, 1:04 PM

## 2017-03-29 ENCOUNTER — Encounter (HOSPITAL_COMMUNITY): Payer: Self-pay | Admitting: Psychiatry

## 2017-03-29 ENCOUNTER — Encounter (HOSPITAL_COMMUNITY): Payer: Self-pay

## 2017-03-30 ENCOUNTER — Encounter (HOSPITAL_COMMUNITY): Payer: Self-pay

## 2017-03-30 DIAGNOSIS — D1721 Benign lipomatous neoplasm of skin and subcutaneous tissue of right arm: Secondary | ICD-10-CM | POA: Diagnosis not present

## 2017-04-04 ENCOUNTER — Telehealth: Payer: Self-pay | Admitting: Neurology

## 2017-04-04 ENCOUNTER — Ambulatory Visit (INDEPENDENT_AMBULATORY_CARE_PROVIDER_SITE_OTHER): Payer: Medicare Other | Admitting: Licensed Clinical Social Worker

## 2017-04-04 DIAGNOSIS — F3340 Major depressive disorder, recurrent, in remission, unspecified: Secondary | ICD-10-CM

## 2017-04-04 NOTE — Telephone Encounter (Signed)
Note faxed.

## 2017-04-04 NOTE — Telephone Encounter (Signed)
Received call from Dr. Casimiro Needle regarding patient.  He did not know that patient previously saw me but did know that she had seen "someone" previously as well as [name] now.  Said that a different psychiatrist referred her back to me.  She is very depressed.  S/p [place].  Other psychiatrist felt that if she was treated for PD depression would resolve but Dr. Casimiro Needle disagreed.  He also wanted my opinion on PBA as felt that patient had this as well as depression (crying spells for no reason).  Pt appears to still be actively following with [facility].  I reviewed Dr. [name] records and am not sure that he is aware that I saw the patient in the past.  Caryl Pina, please fax a copy of my 2016 evaluation of the patient to Dr. [name].

## 2017-04-04 NOTE — Progress Notes (Signed)
Tracy Malone was seen today in the movement disorders clinic for neurologic consultation at the request of [name], MD.  The consultation is for the evaluation of R hand tremor.  Pt states that it started in November.  It seemed to come on abruptly but she doesn't remember.  It seems to come and go but overall hasn't gotten worse.  She notes it the most with eating but it is also is noted at rest.  She is right hand dominant.  She never notes it in the left hand or legs.  No family hx of tremor.  The records that were made available to me were reviewed.  This patient is accompanied in the office by her spouse who supplements the history.    Tremor: Yes.     Affected by caffeine:  No. (rarely drinks coke)  Affected by alcohol: doesn't drink alcohol  Affected by stress:  Yes.    Affected by fatigue:  Yes.    Spills soup if on spoon:  No.  Spills glass of liquid if full:  No.   Other sx's: Voice: no change per pt (she thinks that she has had to try to speak louder due to her husbands HOH) Sleep: trouble getting to sleep and trouble staying asleep  Vivid Dreams:  No.  Acting out dreams:  No. Wet Pillows: No. Postural symptoms:  No. (not unless takes restoril for sleep)  Falls?  No. Bradykinesia symptoms: trouble getting out of bed; walks slower than in the past and attributes to R knee pain after injury Loss of smell:  Unsure - states that she keeps smelling a chemical Loss of taste:  No. Urinary Incontinence:  No. Difficulty Swallowing:  Yes.   with pills Handwriting, micrographia: Yes.   Trouble with ADL's:  No.   Trouble buttoning clothing: No. Depression:  No., but admits to anxiety Memory changes:  No. Hallucinations:  No.  visual distortions: No. N/V:  No. Lightheaded:  No.  Syncope: No. Diplopia:  No. Dyskinesia:  No.  Neuroimaging has not previously been performed.   04/05/17 update:  I have not seen this patient in over 2 years.  The records that were made  available to me were reviewed.  She is accompanied by her husband and daughter, who supplement the history.  I have spoken to her psychiatrist.  I felt that she likely had early PD at our last visit.  I recommended carbidopa/levodopa 25/100.  She did not take this and transferred care to [name].  He felt that she did not have PD but rather ET.  Xanax was recommended for her tremor.  When she last saw [name], on 03/16/17, he started her on propranolol LA, 60 mg because of increased tremor. This did help some.   She has been seeing Dr. [name] at [facility] and he felt that she had sx's consistent with PD and recommend she come to see me.  She states that she has had no falls.  She states that she has normal balance but states that she has back pain today and that has caused some trouble.  No dreams.  No acting out of the dreams.  Handwriting is very little.  Still having trouble with smelling things that are not there.  No visual distortions.  No hallucinations.  No trouble with ADL's/buttoning clothing.  Doing okay with swallowing.  Taking xanax 0.5 mg tid.  Still with depression.  Holding [place] for now.  Independent of depression, she  has crying spells.  PREVIOUS MEDICATIONS: none to date  ALLERGIES:   Allergies  Allergen Reactions  . Iodine Anaphylaxis  . Levsin [Hyoscyamine Sulfate]     Vision problems/pt has glaucoma  . Salmon [Fish Allergy] Hives and Shortness Of Breath  . Shellfish Allergy Anaphylaxis  . Remeron [Mirtazapine] Other (See Comments)    Cause blurred vision and red eyes, pt has glaucoma  . Aspirin Other (See Comments)    Sever stomach pain due to ulcer scaring.  . Ciprofloxacin Diarrhea  . Codeine Nausea And Vomiting  . Darvocet [Propoxyphene N-Acetaminophen] Nausea And Vomiting  . Demerol [Meperidine] Nausea Only  . Dexilant [Dexlansoprazole] Swelling  . Diphedryl [Diphenhydramine] Other (See Comments)    Increased pulse/small amount ok  . Doxycycline Hyclate Other  (See Comments)    GI intolerance.  Marland Kitchen Epinephrine Other (See Comments)    Breathing problems  . Erythromycin Other (See Comments)    GI intolerance.  Yvette Rack [Cyclobenzaprine] Other (See Comments)    Tingly/prickly sensation.  Marland Kitchen Keflex [Cephalexin] Hives  . Latex Other (See Comments)    Gloves ok  . Prednisone Other (See Comments)    Headache  . Pylera [Bis Subcit-Metronid-Tetracyc] Swelling    Tongue swelling. Face tingling  . Sulfa Antibiotics Other (See Comments)    Increased pulse, fainting, diarrhea, thrush  . Xylocaine [Lidocaine Hcl]     With epinephrine, given by dentist.  Speeded up heart rate and she passed out (occured twice, at dentist)  . Zoloft [Sertraline Hcl] Swelling and Other (See Comments)    Migraine Swelling of tongue/lip (09/2012)  . Advil [Ibuprofen] Other (See Comments)    Motrin ok with a GI effect.  . Clarithromycin Rash    Started after completing 10 day course of 2000 mg /day    CURRENT MEDICATIONS:  Outpatient Encounter Prescriptions as of 04/05/2017  Medication Sig  . acetaminophen (TYLENOL) 650 MG CR tablet Take 650 mg by mouth as needed for pain. Reported on 09/06/2015  . ALPRAZolam (XANAX) 0.5 MG tablet Take 0.5 mg by mouth 3 (three) times daily.   Marland Kitchen b complex vitamins capsule Take 1 capsule by mouth as needed.   . Cholecalciferol (VITAMIN D) 2000 UNITS tablet Take 2,000 Units by mouth daily.  . Probiotic Product (ALIGN PO) Take 1 capsule by mouth every other day.   Marland Kitchen SYNTHROID 50 MCG tablet Take 50 mg by mouth every morning.  . vitamin E 400 UNIT capsule Take 400 Units by mouth every Monday, Wednesday, and Friday. Reported on 09/06/2015   No facility-administered encounter medications on file as of 04/05/2017.     PAST MEDICAL HISTORY:   Past Medical History:  Diagnosis Date  . Bell's palsy 1966   ? right side facial droop  . Carotid artery disease ([place]) 2010   on vascular screening;unchanged 2013.(could not tolerate simvastatin, no other  statins tried)--<30% blockage bilat 07/2011  . Chronic abdominal pain   . Chronic fatigue and malaise   . Claustrophobia   . Depression    treated in the past for years;stopped in 2010 for a years  . Duodenal ulcer 1962   h/o  . Fibromyalgia   . Frequent PVCs 07/2012   Seen by [facility]: benign, asymptomatic, normal EF  . GERD (gastroesophageal reflux disease)   . Glaucoma, narrow-angle    s/p laser surgery  . Hyperthyroidism    h/o x 2 years  . Hypothyroid 9/08  . IBS (irritable bowel syndrome)    Dr. Benson Norway  .  Ocular migraine   . Osteoporosis 10/11   Dr.Hawkes  . Panic attack   . Recurrent UTI    has cystocele-Dr.Grewal  . Shingles 1999   h/o  . SLE (systemic lupus erythematosus) (HCC)    with arthralgia/myalgia, positive ANA centromere pattern, positive DNA, positive RNP, indeterminant anti-cardiolipin antibody-Dr.Hawkes.  . Superficial thrombophlebitis 03/2009   RLE  . Trochanteric bursitis 12/2008   bilateral    PAST SURGICAL HISTORY:   Past Surgical History:  Procedure Laterality Date  . ABDOMINAL HYSTERECTOMY    . CATARACT EXTRACTION, BILATERAL  1995, 1996  . Flexible sigmoidoscopy    . THYROIDECTOMY, PARTIAL  09/2005   L nodule; Dr. Harlow Asa  . TONSILLECTOMY  1946  . UPPER GI ENDOSCOPY  06/27/12  . VAGINAL HYSTERECTOMY  1971   and bladder repair.  Still has ovaries    SOCIAL HISTORY:   Social History   Social History  . Marital status: Married    Spouse name: N/A  . Number of children: 2  . Years of education: N/A   Occupational History  . retired (school system)    Social History Main Topics  . Smoking status: Never Smoker  . Smokeless tobacco: Never Used  . Alcohol use No  . Drug use: No  . Sexual activity: No   Other Topics Concern  . Not on file   Social History Narrative   Married.  Son lives in [place]; Daughter [name] lives in [place]; 2 grandchildren    FAMILY HISTORY:   Family Status  Relation Status  . Mother Deceased at age 43        MI  . Father Deceased at age 6       arteriosclerosis  . Sister Alive       2, alive and well  . Brother Deceased at age 22       "natural causes"--had PTSD  . Daughter Alive  . Son Alive  . Brother Deceased       CAD  . Brother Deceased       lung CA  . MGF (Not Specified)    ROS:  A complete 10 system review of systems was obtained and was unremarkable apart from what is mentioned above.  PHYSICAL EXAMINATION:    VITALS:   Vitals:   04/05/17 1026  BP: 104/70  Pulse: 67  SpO2: 98%  Weight: 104 lb 9.6 oz (47.4 kg)  Height: 4\' 11"  (1.499 m)    GEN:  The patient appears stated age and is in NAD. HEENT:  Normocephalic, atraumatic.  The mucous membranes are moist. The superficial temporal arteries are without ropiness or tenderness. CV:  RRR Lungs:  CTAB Neck/HEME:  There are no carotid bruits bilaterally.  Neurological examination:  Orientation: The patient is alert and oriented x3. Fund of knowledge is appropriate.  Recent and remote memory are intact.  Attention and concentration are normal.    Able to name objects and repeat phrases. Cranial nerves: There is good facial symmetry. . Extraocular muscles are intact. The visual fields are full to confrontational testing. The speech is fluent and clear.   She has hypophonic speech.  Soft palate rises symmetrically and there is no tongue deviation. Hearing is intact to conversational tone. Sensation: Sensation is intact to light and pinprick throughout (facial, trunk, extremities). Vibration is intact at the bilateral big toe. There is no extinction with double simultaneous stimulation. There is no sensory dermatomal level identified. Motor: Strength is 5/5 in the bilateral upper and  lower extremities.   Shoulder shrug is equal and symmetric.  There is no pronator drift. Deep tendon reflexes: Deep tendon reflexes are 2-2+/4 at the bilateral biceps, triceps, brachioradialis, patella and trace at the bilateral achilles. Plantar  responses are downgoing bilaterally.  Movement examination: Tone: There is mild increased tone in the RUE that becomes at least moderate with activation procedures.  Tone elsewhere is normal.   Abnormal movements: There is a near constant RUE resting tremor that increases with distraction procedures.  Coordination:  There is decremation with RAM's, seen with finger taps, alternation of supination/pronation of the forearm on the right and hand opening and closing on the right.  She has trouble alternating supination and pronation of the forearm, hand opening and closing, finger taps on the L.  Heel taps are good bilaterally.  Toe taps are slow bilaterally but slower on the right. Gait and Station: She is able to arise out of the chair without the use of her hands.  When she ambulates, the right knee is flexed.  She has tremor in the right arm with ambulation.  She is mildly unsteady.  ASSESSMENT/PLAN:  1.  Idiopathic Parkinson's disease.  The patient has tremor, bradykinesia, rigidity and mild postural instability.  -I last saw the patient 2+ years ago and dx her with PD.  She did not take the carbidopa/levodopa 25/100 prescribed at that point in time.  She sought a 2nd opinion and was told her tremor was due to essential tremor.  She meets [place] brain bank criteria for the diagnoiss of idiopathic parkinsons disease  -We discussed the diagnosis as well as pathophysiology of the disease.  We discussed treatment options as well as prognostic indicators.  Patient education was provided.  -We discussed that it used to be thought that levodopa would increase risk of melanoma but now it is believed that Parkinsons itself likely increases risk of melanoma. she is to get regular skin checks.  -We decided to add carbidopa/levodopa 25/100.  1/2 tab tid x 1 wk, then 1/2 in am & noon & 1 at night for a week, then 1/2 in am &1 at noon &night for a week, then 1 po tid.  Risks, benefits, side effects and alternative  therapies were discussed.  The opportunity to ask questions was given and they were answered to the best of my ability.  The patient expressed understanding and willingness to follow the outlined treatment protocols.  -I will refer the patient to the Parkinson's program at the [facility], for PT/OT and ST.  We talked about the importance of safe, cardiovascular exercise in Parkinson's disease.  -We discussed community resources in the area including patient support groups and community exercise programs for PD and pt education was provided to the patient.  2.  PBA   -It does sound like it is possible that the patient has pseudobulbar affect, in addition to depression.  We talked about Nuedexta, but I really do not want to add too different medications at once.  We will consider this in the future, and she can consider this with her psychiatrist as well.  3.  Depression  -Long discussion with the patient today.  She asked me for treatment for the Parkinson's disease would help her depression.  Given her long-standing depression and failure of multiple medications, I suspect that just adding dopamine is not going to help her mood symptoms.  Many studies show that most Parkinson's patients will need to be on additional medications for depression.  I have no objection to [place], if this is felt beneficial for her depression.  I will leave this to her psychiatrist.  -I did address her Xanax.  I would like to see her off of daytime Xanax.  This decreases her cognition and increases risk for falls.  They will work with psychiatry regarding this medication.  4.  Told patient that they can return to [name] at previously scheduled visit or can return here.  However, I did ask them not to go back and forth because it is not good for her care and she is getting differing opinions and is frustrated by that.  If she chooses to f/u here, she will be seen in 3 months.  Much greater than 50% of this visit  was spent in counseling and coordinating care.  Total face to face time:  45 min

## 2017-04-05 ENCOUNTER — Ambulatory Visit (INDEPENDENT_AMBULATORY_CARE_PROVIDER_SITE_OTHER): Payer: Medicare Other | Admitting: Neurology

## 2017-04-05 ENCOUNTER — Encounter: Payer: Self-pay | Admitting: Neurology

## 2017-04-05 ENCOUNTER — Encounter (HOSPITAL_COMMUNITY): Payer: Self-pay | Admitting: Psychiatry

## 2017-04-05 VITALS — BP 104/70 | HR 67 | Ht 59.0 in | Wt 104.6 lb

## 2017-04-05 DIAGNOSIS — F482 Pseudobulbar affect: Secondary | ICD-10-CM

## 2017-04-05 DIAGNOSIS — F331 Major depressive disorder, recurrent, moderate: Secondary | ICD-10-CM | POA: Diagnosis not present

## 2017-04-05 DIAGNOSIS — G20 Parkinson's disease: Secondary | ICD-10-CM | POA: Diagnosis not present

## 2017-04-05 MED ORDER — CARBIDOPA-LEVODOPA 25-100 MG PO TABS: 1.0000 | ORAL_TABLET | Freq: Three times a day (TID) | ORAL | 1 refills | Status: DC

## 2017-04-05 NOTE — Patient Instructions (Addendum)
1.  Start carbidopa/levodopa 25/100, 1/2 tab three times a day 30 min before meals x 1 wk, then 1/2 in am & noon & 1 in evening for a week, then 1/2 in am &1 at noon &one in evening for a week, then 1 tablet three times a day 30 min before meals before meals  2.  We will send a referral to the neurorehab center for Physical/occupational/speech therapy for Parkinsons disease, they will contact you to schedule this appointment if you do not hear from them within a week call us to let us know   3. Follow up in 3-4 months

## 2017-04-05 NOTE — Progress Notes (Signed)
Spoke with patient via phone regarding recent Neuro visit and once again her formal diagnosis and education regarding Parkinsonism. She is feeling encouraged that she has been diagnosed and understands her symptoms.    Expressed my support for her proceeding with Sinemet titration and we agreed to revisit the need for TMS in about 3-4 weeks. Discussed that sinemet can lower seizure threshold, so we can proceed with caution, if nee ded, once her dose is stable.  She will follow up with her primary psychiatrist for med management on September 5th and can let us know then how she is doing in terms of mood symptoms.  - hold [place] for now - if we decide to resume, I'd like to remap given that she had inadequate improvement after the last treatment

## 2017-04-05 NOTE — Telephone Encounter (Signed)
Patient called to confirm the right directions for her Carbidopa Levodopa medication. She said there are no directions on the bottle. She is to start the medication tomorrow. Please call. Thanks

## 2017-04-05 NOTE — Progress Notes (Signed)
Note faxed.

## 2017-04-09 ENCOUNTER — Telehealth: Payer: Self-pay | Admitting: Neurology

## 2017-04-09 NOTE — Telephone Encounter (Signed)
Pt's daughter [name] left a voicemail message asking for a call back in regards to a reaction to some medication she took on Friday but did not leave the name of the medication

## 2017-04-09 NOTE — Telephone Encounter (Signed)
She does report a lot of allergies which are a lot of sensitivities and not true allergy.  Can she start with carbidopa/levodopa 25/100, 1/2 q day for a week then go to 1/2 bid for a week, then 1/2 tid for a week and then she can folllow titration given?

## 2017-04-09 NOTE — Telephone Encounter (Signed)
Patient's daughter made aware.  She asked about an RX for an epipen to make her mother feel better about taking medication.  Epinephrine was on patient's allergy list. She will relay Dr. Doristine Devoid instructions.

## 2017-04-09 NOTE — Telephone Encounter (Signed)
Spoke with patient's daughter (DPR signed for all [facility] Practices 09/2016) and she states patient started Carbidopa Levodopa 25/100 IR 1/2 TID last Friday. She took Friday dosages, then 1/2 tablet Saturday morning. She then developed burning and swelling in her lips/mouth. She called the pharmacist who advised her to stop taking the medication.   Patient's daughter states that patient is very sensitive to medications and thinks this may be an additive issue, states she has had them in the past. She wants to know if there is an alternative.   Please advise. Daughter can be reached on her cell phone [phone].

## 2017-04-17 ENCOUNTER — Telehealth (HOSPITAL_COMMUNITY): Payer: Self-pay | Admitting: Psychiatry

## 2017-04-17 ENCOUNTER — Ambulatory Visit: Payer: Self-pay | Admitting: Neurology

## 2017-04-17 ENCOUNTER — Telehealth: Payer: Self-pay | Admitting: Neurology

## 2017-04-17 NOTE — Telephone Encounter (Signed)
Pt's daughter left a voicemail message wanting a call back regarding an interaction with medication

## 2017-04-18 NOTE — Telephone Encounter (Signed)
There really aren't other options in her age group.  Levodopa is converted to dopamine in the body, which is a naturally occurring neurotransmitter and I have never had anyone had puffy lips with it.  Carbidopa is merely in it to prevent it from breaking down in the stomach and making her sick.  She can d/c it.  Any other new meds?  Other parkinsons meds are not as much an option in her age group and would be MORE likely to give side effects than carbidopa/levodopa

## 2017-04-18 NOTE — Telephone Encounter (Signed)
Left message on machine for patient to call back.

## 2017-04-18 NOTE — Telephone Encounter (Signed)
Spoke with patient's daughter.  She states she went to visit her mother, even on a half tablet of Levodopa she developed red/puffy lips and swelling in the mouth. Patient stopped medication.   Any alternatives?   Patient's daughter wanted me to note that she is also dealing with a lot of depression and is in the process of trying to treat this as well. No new medications.

## 2017-04-18 NOTE — Telephone Encounter (Signed)
Spoke with patient's daughter. Made her aware of recommendations.  She is worried more about depression at this point and they will treat that first. They will keep follow up appt.

## 2017-04-18 NOTE — Telephone Encounter (Signed)
I called and left her a VM.

## 2017-04-18 NOTE — Telephone Encounter (Signed)
I spoke with the patient's daughter for about 20 minutes.  She reports that her mom continues to be very tearful, very depressed, and this is certainly been complicated by her pseudobulbar affect.  She has not been able to tolerate Sinemet, due to some unspecific intolerance and lip swelling.  She had questions regarding treatment planning, [place], and additional treatment options. I reviewed the pathophysiology of [place], and how [place] is used to treat major depressive disorder. We reviewed the patient's prior response to [place] which was essentially a negative or nonresponse, and her most recent trial of [place] for 3 weeks, once again without response.  We discussed that we may want to consider remapping and pursuing a different treatment site in the [place] process.  I also educated the daughter on the option of ECT, which I also spoke about with the patient during our previous discussions. Ultimately, given her failure with [place], it would be quite reasonable for the family to seek a sitdown consultation with the ECT team. Discussed that ECT is by far the most effective treatment for major depressive disorder, but her neurocognitive status would certainly complicate treatment, given that the risk of delirium and Parkinson's disease associated with ECT is quite elevated.  Daughter was receptive to the education provided. She reports that her mom asked about ECT, specifically, because one of her family members have had this in the past. Daughter reports that they're going to schedule a consultation for ECT, and continue to think about whether or not they want to restart [place].  I encouraged her to discuss ECT and the prospects of proceeding with treatment, with the patient's primary psychiatrist, and certainly feel free to contact us if they would like to reconsider doing a new mapping for [place].

## 2017-04-20 ENCOUNTER — Telehealth: Payer: Self-pay | Admitting: Neurology

## 2017-04-20 NOTE — Telephone Encounter (Signed)
Pt's daughter called and said pt was supposed to be referred for physical therapy and has not heard anything yet and also a boxing class and has not heard on that either

## 2017-04-20 NOTE — Telephone Encounter (Signed)
LMOM letting daughter know that they can contact [facility] at [phone] to set up therapy. Referral was sent at appt. Also that boxing class does not need a referral. They would contact them directly for details. Information was given at appt.

## 2017-04-21 ENCOUNTER — Encounter (HOSPITAL_COMMUNITY): Payer: Self-pay | Admitting: Psychiatry

## 2017-04-23 ENCOUNTER — Ambulatory Visit (INDEPENDENT_AMBULATORY_CARE_PROVIDER_SITE_OTHER): Payer: Medicare Other | Admitting: Licensed Clinical Social Worker

## 2017-04-23 DIAGNOSIS — F331 Major depressive disorder, recurrent, moderate: Secondary | ICD-10-CM | POA: Diagnosis not present

## 2017-04-24 ENCOUNTER — Encounter (HOSPITAL_COMMUNITY): Payer: Self-pay | Admitting: Psychiatry

## 2017-04-24 ENCOUNTER — Encounter: Payer: Self-pay | Admitting: Psychiatry

## 2017-04-24 ENCOUNTER — Ambulatory Visit (INDEPENDENT_AMBULATORY_CARE_PROVIDER_SITE_OTHER): Payer: Medicare Other | Admitting: Psychiatry

## 2017-04-24 VITALS — BP 137/75 | HR 79 | Temp 97.7°F | Wt 105.4 lb

## 2017-04-24 DIAGNOSIS — G20 Parkinson's disease: Secondary | ICD-10-CM | POA: Diagnosis not present

## 2017-04-24 DIAGNOSIS — Z9889 Other specified postprocedural states: Secondary | ICD-10-CM | POA: Insufficient documentation

## 2017-04-24 DIAGNOSIS — F332 Major depressive disorder, recurrent severe without psychotic features: Secondary | ICD-10-CM

## 2017-04-24 DIAGNOSIS — E063 Autoimmune thyroiditis: Secondary | ICD-10-CM | POA: Insufficient documentation

## 2017-04-24 NOTE — Progress Notes (Signed)
ECT: This is a intake and consultation evaluation for ECT for this 83 year old woman with a history of depression. Chart reviewed. Met with patient and her husband and daughter. 83 year old woman who has had symptoms of major depression for a few years now. Current symptoms include low energy to the point that she frequently does not get dressed and does almost nothing around the house on a daily basis. Unable to do even basic household tasks. Mood is sad and down frequently with a hopeless feeling and frequent crying spells. Sleep is poor. Energy level poor. Appetite poor with loss of significant weight over the last several months. Patient denies any suicidal thoughts no wish to die or to kill her self. Denies any hallucinations or delusions or other psychotic symptoms. Patient is referred for consideration of ECT treatment by the doctors in [place].  Most recent treatment has included 2 rounds of transcranial magnetic stimulation. Patient felt that the first round was partially helpful although the benefit was transient. She felt the second round produced little effect. She has also been on multiple medications including Prozac, Lexapro, Wellbutrin all at normal doses for therapeutic times. Patient reports that medicines either were of no effect or in a couple of cases such as Zoloft caused possible allergic reactions and side effects or made her feel agitated. She is currently not on any antidepressant medicine and taking only a low dose of Xanax 0.5 mg 3 times a day as far as psychiatric medicine.  Patient's medical condition is currently notable for a new diagnosis of probable Parkinson's disease. The severity of her condition is still mild. Treatment was attempted with levodopa/carbidopa but the patient was unable to tolerate it saying it made her feel like her face was tingly. She has no history of coronary artery disease no history of stroke no history of renal disease. She does have a history of  hypothyroidism and is on thyroid replacement.  Social history: Patient lives with her husband. Daughter nearby. Family very supportive and fully in agreement with getting treatment. They understand the needs for transportation and support during a course of ECT and are willing to be available.  Patient presents as a neatly dressed and groomed woman cooperative and appropriate during the interview. Psychomotor activity somewhat diminished. Mild resting tremor notable. Eye contact good. Affect somewhat flattened. Mood depressed. Some tearfulness during the interview at appropriate times. Thoughts are lucid without any signs of loosening of associations or delusions. Patient denies suicidal or homicidal ideation. Patient asked multiple appropriate questions and appears to have good BASIC knowledge of medical terms. She asked appropriate questions regarding anesthesia. Patient clearly understands well enough to make appropriate medical judgments. Patient and family both report that no concern has been raised about significant memory loss or dementia. I did not do formal testing but the patient was able to remember multiple medications as well as details of recent treatment and conversations in a way that seemed to indicate that she was cognitively intact.  83 year old woman with a history of major depression severe recurrent without psychotic features that has been going on for more than a year. Resistant to trials of multiple medicines. Resistant now to transcranial magnetic stimulation. Causing major life distress and lack of quality of life. Patient does not have any major contraindications.  Description of treatment was made and patient and her family were given ample opportunity to ask questions. I recommend right unilateral ECT treatment. Risks of short-term memory impairment and confusion were described. Risks of transient hypertension  and pain described. Patient is tentatively willing to pursue  treatment.  I am going to have her get the usual labs including EKG and chest x-ray. Orders completed. I will send an email to her outpatient psychiatrist and the referring doctor. I will send an email to our nursing staff and to utilization review. We are tentatively agreeing on a plan to initiate treatment on October 15.

## 2017-04-27 DIAGNOSIS — F411 Generalized anxiety disorder: Secondary | ICD-10-CM | POA: Diagnosis not present

## 2017-04-30 ENCOUNTER — Encounter
Admission: RE | Admit: 2017-04-30 | Discharge: 2017-04-30 | Disposition: A | Discharge status: Home / Self Care | Payer: Medicare Other | Source: Ambulatory Visit | Attending: Psychiatry | Admitting: Psychiatry

## 2017-04-30 ENCOUNTER — Ambulatory Visit
Admission: RE | Admit: 2017-04-30 | Discharge: 2017-04-30 | Disposition: A | Discharge status: Home / Self Care | Payer: Medicare Other | Source: Ambulatory Visit | Attending: Psychiatry | Admitting: Psychiatry

## 2017-04-30 DIAGNOSIS — R918 Other nonspecific abnormal finding of lung field: Secondary | ICD-10-CM | POA: Diagnosis not present

## 2017-04-30 DIAGNOSIS — N644 Mastodynia: Secondary | ICD-10-CM | POA: Diagnosis not present

## 2017-04-30 DIAGNOSIS — Z01818 Encounter for other preprocedural examination: Secondary | ICD-10-CM | POA: Diagnosis not present

## 2017-04-30 DIAGNOSIS — I2510 Atherosclerotic heart disease of native coronary artery without angina pectoris: Secondary | ICD-10-CM | POA: Diagnosis not present

## 2017-04-30 DIAGNOSIS — K219 Gastro-esophageal reflux disease without esophagitis: Secondary | ICD-10-CM | POA: Diagnosis not present

## 2017-04-30 HISTORY — DX: Personal history of other diseases of the digestive system: Z87.19

## 2017-04-30 HISTORY — DX: Cardiac arrhythmia, unspecified: I49.9

## 2017-04-30 LAB — URINALYSIS, ROUTINE W REFLEX MICROSCOPIC
BILIRUBIN URINE: NEGATIVE
Glucose, UA: NEGATIVE mg/dL
Hgb urine dipstick: NEGATIVE
KETONES UR: NEGATIVE mg/dL
LEUKOCYTES UA: NEGATIVE
NITRITE: NEGATIVE
PROTEIN: NEGATIVE mg/dL
Specific Gravity, Urine: 1.008 (ref 1.005–1.030)
pH: 5 (ref 5.0–8.0)

## 2017-04-30 LAB — BASIC METABOLIC PANEL
ANION GAP: 9 (ref 5–15)
BUN: 17 mg/dL (ref 6–20)
CALCIUM: 9.5 mg/dL (ref 8.9–10.3)
CO2: 29 mmol/L (ref 22–32)
CREATININE: 0.73 mg/dL (ref 0.44–1.00)
Chloride: 101 mmol/L (ref 101–111)
GFR calc Af Amer: >60 mL/min (ref >60)
GFR calc non Af Amer: >60 mL/min (ref >60)
Glucose, Bld: 93 mg/dL (ref 65–99)
Potassium: 3.5 mmol/L (ref 3.5–5.1)
SODIUM: 139 mmol/L (ref 135–145)

## 2017-04-30 LAB — CBC
HEMATOCRIT: 43.1 % (ref 35.0–47.0)
Hemoglobin: 14.4 g/dL (ref 12.0–16.0)
MCH: 30.3 pg (ref 26.0–34.0)
MCHC: 33.4 g/dL (ref 32.0–36.0)
MCV: 90.8 fL (ref 80.0–100.0)
Platelets: 272 10*3/uL (ref 150–440)
RBC: 4.75 MIL/uL (ref 3.80–5.20)
RDW: 14 % (ref 11.5–14.5)
WBC: 10.9 10*3/uL (ref 3.6–11.0)

## 2017-05-02 ENCOUNTER — Ambulatory Visit (INDEPENDENT_AMBULATORY_CARE_PROVIDER_SITE_OTHER): Payer: Medicare Other | Admitting: Licensed Clinical Social Worker

## 2017-05-02 DIAGNOSIS — F331 Major depressive disorder, recurrent, moderate: Secondary | ICD-10-CM

## 2017-05-07 ENCOUNTER — Encounter
Admission: RE | Admit: 2017-05-07 | Discharge: 2017-05-07 | Disposition: A | Discharge status: Home / Self Care | Payer: Medicare Other | Source: Ambulatory Visit | Attending: Psychiatry | Admitting: Psychiatry

## 2017-05-07 ENCOUNTER — Encounter: Payer: Self-pay | Admitting: Anesthesiology

## 2017-05-07 ENCOUNTER — Other Ambulatory Visit: Payer: Medicare Other

## 2017-05-07 ENCOUNTER — Ambulatory Visit: Payer: Self-pay | Admitting: Anesthesiology

## 2017-05-07 DIAGNOSIS — K589 Irritable bowel syndrome without diarrhea: Secondary | ICD-10-CM | POA: Diagnosis not present

## 2017-05-07 DIAGNOSIS — R5381 Other malaise: Secondary | ICD-10-CM | POA: Insufficient documentation

## 2017-05-07 DIAGNOSIS — Z87440 Personal history of urinary (tract) infections: Secondary | ICD-10-CM | POA: Diagnosis not present

## 2017-05-07 DIAGNOSIS — R5382 Chronic fatigue, unspecified: Secondary | ICD-10-CM | POA: Insufficient documentation

## 2017-05-07 DIAGNOSIS — M542 Cervicalgia: Secondary | ICD-10-CM | POA: Diagnosis not present

## 2017-05-07 DIAGNOSIS — F332 Major depressive disorder, recurrent severe without psychotic features: Secondary | ICD-10-CM | POA: Diagnosis not present

## 2017-05-07 DIAGNOSIS — K219 Gastro-esophageal reflux disease without esophagitis: Secondary | ICD-10-CM | POA: Insufficient documentation

## 2017-05-07 DIAGNOSIS — G8929 Other chronic pain: Secondary | ICD-10-CM | POA: Diagnosis not present

## 2017-05-07 DIAGNOSIS — M797 Fibromyalgia: Secondary | ICD-10-CM | POA: Diagnosis not present

## 2017-05-07 DIAGNOSIS — E039 Hypothyroidism, unspecified: Secondary | ICD-10-CM | POA: Diagnosis not present

## 2017-05-07 DIAGNOSIS — H409 Unspecified glaucoma: Secondary | ICD-10-CM | POA: Diagnosis not present

## 2017-05-07 MED ORDER — SUCCINYLCHOLINE CHLORIDE 20 MG/ML IJ SOLN
INTRAMUSCULAR | Status: DC | PRN
  Administered 2017-05-07: 60 mg via INTRAVENOUS

## 2017-05-07 MED ORDER — SODIUM CHLORIDE 0.9 % IV SOLN
500.0000 mL | Freq: Once | INTRAVENOUS | Status: AC
  Administered 2017-05-07: 500 mL via INTRAVENOUS

## 2017-05-07 MED ORDER — SUCCINYLCHOLINE CHLORIDE 20 MG/ML IJ SOLN
INTRAMUSCULAR | Status: AC
  Filled 2017-05-07: qty 1

## 2017-05-07 MED ORDER — METHOHEXITAL SODIUM 100 MG/10ML IV SOSY
PREFILLED_SYRINGE | INTRAVENOUS | Status: DC | PRN
  Administered 2017-05-07: 50 mg via INTRAVENOUS

## 2017-05-07 MED ORDER — SODIUM CHLORIDE 0.9 % IV SOLN
INTRAVENOUS | Status: DC | PRN
  Administered 2017-05-07: 10:00:00 via INTRAVENOUS

## 2017-05-07 MED ORDER — METHOHEXITAL SODIUM 0.5 G IJ SOLR
INTRAMUSCULAR | Status: AC
  Filled 2017-05-07: qty 500

## 2017-05-07 NOTE — Anesthesia Post-op Follow-up Note (Signed)
Anesthesia QCDR form completed.        

## 2017-05-07 NOTE — Anesthesia Preprocedure Evaluation (Signed)
Anesthesia Evaluation  Patient identified by MRN, date of birth, ID band Patient awake    Reviewed: Allergy & Precautions, H&P , NPO status , Patient's Chart, lab work & pertinent test results  History of Anesthesia Complications Negative for: history of anesthetic complications  Airway Mallampati: III  TM Distance: <3 FB Neck ROM: limited    Dental  (+) Poor Dentition, Chipped, Missing   Pulmonary neg pulmonary ROS, neg shortness of breath,           Cardiovascular Exercise Tolerance: Good (-) angina+ Peripheral Vascular Disease  (-) Past MI and (-) DOE + dysrhythmias (PVCs)      Neuro/Psych  Headaches, PSYCHIATRIC DISORDERS Anxiety Depression  Neuromuscular disease negative psych ROS   GI/Hepatic Neg liver ROS, hiatal hernia, PUD, GERD  Medicated and Controlled,  Endo/Other  Hypothyroidism   Renal/GU negative Renal ROS  negative genitourinary   Musculoskeletal  (+) Fibromyalgia -  Abdominal   Peds  Hematology negative hematology ROS (+)   Anesthesia Other Findings Past Medical History: 1966: Bell's palsy     Comment:  ? right side facial droop 2010: Carotid artery disease ([place])     Comment:  on vascular screening;unchanged 2013.(could not tolerate              simvastatin, no other statins tried)--<30% blockage bilat              07/2011 No date: Chronic abdominal pain No date: Chronic fatigue and malaise No date: Claustrophobia No date: Depression     Comment:  treated in the past for years;stopped in 2010 for a               years 1962: Duodenal ulcer     Comment:  h/o No date: Dysrhythmia     Comment:  ocassional PVC's No date: Fibromyalgia 07/2012: Frequent PVCs     Comment:  Seen by [facility] Cards: benign, asymptomatic, normal EF No date: GERD (gastroesophageal reflux disease) No date: Glaucoma, narrow-angle     Comment:  s/p laser surgery No date: History of hiatal hernia     Comment:  during  endoscopy 9/08: Hypothyroid No date: IBS (irritable bowel syndrome)     Comment:  Dr. Benson [place] No date: Ocular migraine 10/11: Osteoporosis     Comment:  Dr.Hawkes No date: Panic attack No date: Recurrent UTI     Comment:  has cystocele-Dr.Grewal 1999: Shingles     Comment:  h/o 03/2009: Superficial thrombophlebitis     Comment:  RLE 12/2008: Trochanteric bursitis     Comment:  bilateral  Past Surgical History: No date: [place]: CATARACT EXTRACTION, BILATERAL No date: Flexible sigmoidoscopy 09/2005: THYROIDECTOMY, PARTIAL     Comment:  L nodule; Dr. Harlow Asa 1946: TONSILLECTOMY 06/27/12: UPPER GI ENDOSCOPY 1971: VAGINAL HYSTERECTOMY     Comment:  and bladder repair.  Still has ovaries  BMI    Body Mass Index:  21.01 kg/m      Reproductive/Obstetrics negative OB ROS                             Anesthesia Physical Anesthesia Plan  ASA: III  Anesthesia Plan: General   Post-op Pain Management:    Induction: Intravenous  PONV Risk Score and Plan:   Airway Management Planned: Natural Airway, Nasal Cannula and Mask  Additional Equipment:   Intra-op Plan:   Post-operative Plan:   Informed Consent: I have reviewed the patients History and Physical,  chart, labs and discussed the procedure including the risks, benefits and alternatives for the proposed anesthesia with the patient or authorized representative who has indicated his/her understanding and acceptance.   Dental Advisory Given  Plan Discussed with: Anesthesiologist, CRNA and Surgeon  Anesthesia Plan Comments: (Patient consented for risks of anesthesia including but not limited to:  - adverse reactions to medications - risk of intubation if required - damage to teeth, lips or other oral mucosa - sore throat or hoarseness - Damage to heart, brain, lungs or loss of life  Patient voiced understanding.)        Anesthesia Quick Evaluation

## 2017-05-07 NOTE — Transfer of Care (Signed)
Immediate Anesthesia Transfer of Care Note  Patient: Tracy Malone  Procedure(s) Performed: ECT TX  Patient Location: PACU  Anesthesia Type:General  Level of Consciousness: awake and sedated  Airway & Oxygen Therapy: Patient Spontanous Breathing and Patient connected to face mask oxygen  Post-op Assessment: Report given to RN and Post -op Vital signs reviewed and stable  Post vital signs: Reviewed and stable  Last Vitals:  Vitals:   05/07/17 0905  BP: (!) 130/51  Pulse: 71  Resp: 18  Temp: 37.1 C  SpO2: 99%    Last Pain:  Vitals:   05/07/17 0905  TempSrc: Oral         Complications: No apparent anesthesia complications

## 2017-05-07 NOTE — Discharge Instructions (Signed)
1)  The drugs that you have been given will stay in your system until tomorrow so for the       next 24 hours you should not:  A. Drive an automobile  B. Make any legal decisions  C. Drink any alcoholic beverages  2)  You may resume your regular meals upon return home.  3)  A responsible adult must take you home.  Someone should stay with you for a few          hours, then be available by phone for the remainder of the treatment day.  4)  You May experience any of the following symptoms:  Headache, Nausea and a dry mouth (due to the medications you were given),  temporary memory loss and some confusion, or sore muscles (a warm bath  should help this).  If you you experience any of these symptoms let us know on                your return visit.  5)  Report any of the following: any acute discomfort, severe headache, or temperature        greater than 100.5 F.   Also report any unusual redness, swelling, drainage, or pain         at your IV site.    You may report Symptoms to:  [place] [facility]          Phone: [phone], ECT Department           or Dr. Prescott Gum office [phone]  6)  Your next ECT Treatment is Wednesday May 09, 2018 at 0830   We will call 2 days prior to your scheduled appointment for arrival times.  7)  Nothing to eat or drink after midnight the night before your procedure.  8)  Take     With a sip of water the morning of your procedure.  9)  Other Instructions: Call [phone] to cancel the morning of your procedure due         to illness or emergency.  10) We will call within 72 hours to assess how you are feeling.

## 2017-05-07 NOTE — Anesthesia Postprocedure Evaluation (Signed)
Anesthesia Post Note  Patient: Tracy Malone  Procedure(s) Performed: ECT TX  Patient location during evaluation: PACU Anesthesia Type: General Level of consciousness: awake and alert Pain management: pain level controlled Vital Signs Assessment: post-procedure vital signs reviewed and stable Respiratory status: spontaneous breathing, nonlabored ventilation, respiratory function stable and patient connected to nasal cannula oxygen Cardiovascular status: blood pressure returned to baseline and stable Postop Assessment: no apparent nausea or vomiting Anesthetic complications: no     Last Vitals:  Vitals:   05/07/17 1105 05/07/17 1120  BP: (!) 144/78 (!) 143/87  Pulse: 98   Resp: 15 (!) 97  Temp:  37 C  SpO2: 98%     Last Pain:  Vitals:   05/07/17 1120  TempSrc: Oral  PainSc: 4                  Precious Haws Oneill Bais

## 2017-05-08 ENCOUNTER — Other Ambulatory Visit: Payer: Self-pay | Admitting: Psychiatry

## 2017-05-09 ENCOUNTER — Encounter: Payer: Self-pay | Admitting: Certified Registered Nurse Anesthetist

## 2017-05-09 ENCOUNTER — Encounter (HOSPITAL_BASED_OUTPATIENT_CLINIC_OR_DEPARTMENT_OTHER)
Admission: RE | Admit: 2017-05-09 | Discharge: 2017-05-09 | Disposition: A | Discharge status: Home / Self Care | Payer: Medicare Other | Source: Ambulatory Visit | Attending: Psychiatry | Admitting: Psychiatry

## 2017-05-09 DIAGNOSIS — R5381 Other malaise: Secondary | ICD-10-CM | POA: Diagnosis not present

## 2017-05-09 DIAGNOSIS — F332 Major depressive disorder, recurrent severe without psychotic features: Secondary | ICD-10-CM

## 2017-05-09 DIAGNOSIS — K219 Gastro-esophageal reflux disease without esophagitis: Secondary | ICD-10-CM | POA: Diagnosis not present

## 2017-05-09 DIAGNOSIS — E039 Hypothyroidism, unspecified: Secondary | ICD-10-CM | POA: Diagnosis not present

## 2017-05-09 DIAGNOSIS — F419 Anxiety disorder, unspecified: Secondary | ICD-10-CM | POA: Diagnosis not present

## 2017-05-09 DIAGNOSIS — R5382 Chronic fatigue, unspecified: Secondary | ICD-10-CM | POA: Diagnosis not present

## 2017-05-09 DIAGNOSIS — G8929 Other chronic pain: Secondary | ICD-10-CM | POA: Diagnosis not present

## 2017-05-09 DIAGNOSIS — I739 Peripheral vascular disease, unspecified: Secondary | ICD-10-CM | POA: Diagnosis not present

## 2017-05-09 DIAGNOSIS — M542 Cervicalgia: Secondary | ICD-10-CM | POA: Diagnosis not present

## 2017-05-09 DIAGNOSIS — F329 Major depressive disorder, single episode, unspecified: Secondary | ICD-10-CM | POA: Diagnosis not present

## 2017-05-09 MED ORDER — METHOHEXITAL SODIUM 100 MG/10ML IV SOSY
PREFILLED_SYRINGE | INTRAVENOUS | Status: DC | PRN
  Administered 2017-05-09: 50 mg via INTRAVENOUS

## 2017-05-09 MED ORDER — SODIUM CHLORIDE 0.9 % IV SOLN
500.0000 mL | Freq: Once | INTRAVENOUS | Status: AC
  Administered 2017-05-09: 500 mL via INTRAVENOUS

## 2017-05-09 MED ORDER — METHOHEXITAL SODIUM 0.5 G IJ SOLR
INTRAMUSCULAR | Status: AC
  Filled 2017-05-09: qty 500

## 2017-05-09 MED ORDER — SUCCINYLCHOLINE CHLORIDE 20 MG/ML IJ SOLN
INTRAMUSCULAR | Status: DC | PRN
  Administered 2017-05-09: 60 mg via INTRAVENOUS

## 2017-05-09 MED ORDER — SODIUM CHLORIDE 0.9 % IV SOLN
INTRAVENOUS | Status: DC | PRN
  Administered 2017-05-09: 10:00:00 via INTRAVENOUS

## 2017-05-09 MED ORDER — FENTANYL CITRATE (PF) 100 MCG/2ML IJ SOLN: 25.0000 ug | INTRAMUSCULAR | Status: DC | PRN

## 2017-05-09 NOTE — Anesthesia Post-op Follow-up Note (Signed)
Anesthesia QCDR form completed.        

## 2017-05-09 NOTE — Anesthesia Postprocedure Evaluation (Signed)
Anesthesia Post Note  Patient: Tracy Malone  Procedure(s) Performed: ECT TX  Patient location during evaluation: PACU Anesthesia Type: General Level of consciousness: awake and alert Pain management: pain level controlled Vital Signs Assessment: post-procedure vital signs reviewed and stable Respiratory status: spontaneous breathing and respiratory function stable Cardiovascular status: stable Anesthetic complications: no     Last Vitals:  Vitals:   05/09/17 1117 05/09/17 1132  BP: (!) 146/83 136/82  Pulse: 85 78  Resp: (!) 21 18  Temp:  37.1 C  SpO2: 97%     Last Pain:  Vitals:   05/09/17 1132  TempSrc: Oral  PainSc:                  KEPHART,WILLIAM K

## 2017-05-09 NOTE — Anesthesia Preprocedure Evaluation (Signed)
Anesthesia Evaluation  Patient identified by MRN, date of birth, ID band Patient awake    Reviewed: Allergy & Precautions, H&P , NPO status , Patient's Chart, lab work & pertinent test results  History of Anesthesia Complications Negative for: history of anesthetic complications  Airway Mallampati: III  TM Distance: <3 FB Neck ROM: limited    Dental  (+) Poor Dentition, Chipped, Missing   Pulmonary neg pulmonary ROS, neg shortness of breath,           Cardiovascular Exercise Tolerance: Good (-) angina+ Peripheral Vascular Disease  (-) Past MI and (-) DOE + dysrhythmias (PVCs)      Neuro/Psych  Headaches, PSYCHIATRIC DISORDERS Anxiety Depression  Neuromuscular disease negative psych ROS   GI/Hepatic Neg liver ROS, hiatal hernia, PUD, GERD  Medicated and Controlled,  Endo/Other  Hypothyroidism   Renal/GU negative Renal ROS  negative genitourinary   Musculoskeletal  (+) Fibromyalgia -  Abdominal   Peds  Hematology negative hematology ROS (+)   Anesthesia Other Findings Past Medical History: 1966: Bell's palsy     Comment:  ? right side facial droop 2010: Carotid artery disease ([place])     Comment:  on vascular screening;unchanged 2013.(could not tolerate              simvastatin, no other statins tried)--<30% blockage bilat              07/2011 No date: Chronic abdominal pain No date: Chronic fatigue and malaise No date: Claustrophobia No date: Depression     Comment:  treated in the past for years;stopped in 2010 for a               years 1962: Duodenal ulcer     Comment:  h/o No date: Dysrhythmia     Comment:  ocassional PVC's No date: Fibromyalgia 07/2012: Frequent PVCs     Comment:  Seen by  Cards: benign, asymptomatic, normal EF No date: GERD (gastroesophageal reflux disease) No date: Glaucoma, narrow-angle     Comment:  s/p laser surgery No date: History of hiatal hernia     Comment:  during  endoscopy 9/08: Hypothyroid No date: IBS (irritable bowel syndrome)     Comment:  Dr. Benson [place] No date: Ocular migraine 10/11: Osteoporosis     Comment:  Dr.Hawkes No date: Panic attack No date: Recurrent UTI     Comment:  has cystocele-Dr.Grewal 1999: Shingles     Comment:  h/o 03/2009: Superficial thrombophlebitis     Comment:  RLE 12/2008: Trochanteric bursitis     Comment:  bilateral  Past Surgical History: No date: [place]: CATARACT EXTRACTION, BILATERAL No date: Flexible sigmoidoscopy 09/2005: THYROIDECTOMY, PARTIAL     Comment:  L nodule; Dr. Harlow Asa 1946: TONSILLECTOMY 06/27/12: UPPER GI ENDOSCOPY 1971: VAGINAL HYSTERECTOMY     Comment:  and bladder repair.  Still has ovaries  BMI    Body Mass Index:  21.01 kg/m      Reproductive/Obstetrics negative OB ROS                             Anesthesia Physical  Anesthesia Plan  ASA: III  Anesthesia Plan: General   Post-op Pain Management:    Induction: Intravenous  PONV Risk Score and Plan:   Airway Management Planned: Natural Airway, Nasal Cannula and Mask  Additional Equipment:   Intra-op Plan:   Post-operative Plan:   Informed Consent: I have reviewed the patients History and  Physical, chart, labs and discussed the procedure including the risks, benefits and alternatives for the proposed anesthesia with the patient or authorized representative who has indicated his/her understanding and acceptance.   Dental Advisory Given  Plan Discussed with: Anesthesiologist, CRNA and Surgeon  Anesthesia Plan Comments: (Patient consented for risks of anesthesia including but not limited to:  - adverse reactions to medications - risk of intubation if required - damage to teeth, lips or other oral mucosa - sore throat or hoarseness - Damage to heart, brain, lungs or loss of life  Patient voiced understanding.)        Anesthesia Quick Evaluation

## 2017-05-09 NOTE — Anesthesia Procedure Notes (Signed)
Performed by: Chetara Kropp Pre-anesthesia Checklist: Patient identified, Patient being monitored, Timeout performed, Emergency Drugs available and Suction available Patient Re-evaluated:Patient Re-evaluated prior to inductionOxygen Delivery Method: Circle system utilized Preoxygenation: Pre-oxygenation with 100% oxygen Ventilation: Mask ventilation without difficulty Airway Equipment and Method: Bite block Dental Injury: Teeth and Oropharynx as per pre-operative assessment        

## 2017-05-09 NOTE — Transfer of Care (Signed)
Immediate Anesthesia Transfer of Care Note  Patient: Tracy Malone  Procedure(s) Performed: ECT TX  Patient Location: PACU  Anesthesia Type:General  Level of Consciousness: awake and alert   Airway & Oxygen Therapy: Patient Spontanous Breathing and Patient connected to face mask oxygen  Post-op Assessment: Report given to RN and Post -op Vital signs reviewed and stable  Post vital signs: Reviewed and stable  Last Vitals:  Vitals:   05/09/17 0841  BP: (!) 148/64  Pulse: 69  Resp: 18  Temp: 36.4 C  SpO2: 100%    Last Pain:  Vitals:   05/09/17 0841  TempSrc: Oral         Complications: No apparent anesthesia complications

## 2017-05-09 NOTE — Discharge Instructions (Signed)
1)  The drugs that you have been given will stay in your system until tomorrow so for the       next 24 hours you should not:  A. Drive an automobile  B. Make any legal decisions  C. Drink any alcoholic beverages  2)  You may resume your regular meals upon return home.  3)  A responsible adult must take you home.  Someone should stay with you for a few          hours, then be available by phone for the remainder of the treatment day.  4)  You May experience any of the following symptoms:  Headache, Nausea and a dry mouth (due to the medications you were given),  temporary memory loss and some confusion, or sore muscles (a warm bath  should help this).  If you you experience any of these symptoms let us know on                your return visit.  5)  Report any of the following: any acute discomfort, severe headache, or temperature        greater than 100.5 F.   Also report any unusual redness, swelling, drainage, or pain         at your IV site.    You may report Symptoms to:  [place] at [facility]          Phone: [phone], ECT Department           or Dr. Prescott Gum office [phone]  6)  Your next ECT Treatment is Friday May 11, 2017 at 0830  We will call 2 days prior to your scheduled appointment for arrival times.  7)  Nothing to eat or drink after midnight the night before your procedure.  8)  Take      With a sip of water the morning of your procedure.  9)  Other Instructions: Call [phone] to cancel the morning of your procedure due         to illness or emergency.  10) We will call within 72 hours to assess how you are feeling.

## 2017-05-09 NOTE — H&P (Signed)
Tracy Malone is an 83 y.o. female.   Chief Complaint: patient with major depression. Second treatment. Had some predictable soreness in the jaw and neck after the last treatment. HPI: history of recurrent severe depression and not responding to medication  Past Medical History:  Diagnosis Date  . Bell's palsy 1966   ? right side facial droop  . Carotid artery disease ([place]) 2010   on vascular screening;unchanged 2013.(could not tolerate simvastatin, no other statins tried)--<30% blockage bilat 07/2011  . Chronic abdominal pain   . Chronic fatigue and malaise   . Claustrophobia   . Depression    treated in the past for years;stopped in 2010 for a years  . Duodenal ulcer 1962   h/o  . Dysrhythmia    ocassional PVC's  . Fibromyalgia   . Frequent PVCs 07/2012   Seen by [facility]: benign, asymptomatic, normal EF  . GERD (gastroesophageal reflux disease)   . Glaucoma, narrow-angle    s/p laser surgery  . History of hiatal hernia    during endoscopy  . Hypothyroid 9/08  . IBS (irritable bowel syndrome)    Dr. Benson Norway  . Ocular migraine   . Osteoporosis 10/11   Dr.Hawkes  . Panic attack   . Recurrent UTI    has cystocele-Dr.Grewal  . Shingles 1999   h/o  . Superficial thrombophlebitis 03/2009   RLE  . Trochanteric bursitis 12/2008   bilateral    Past Surgical History:  Procedure Laterality Date  . ABDOMINAL HYSTERECTOMY    . CATARACT EXTRACTION, BILATERAL  1995, 1996  . Flexible sigmoidoscopy    . THYROIDECTOMY, PARTIAL  09/2005   L nodule; Dr. Harlow Asa  . TONSILLECTOMY  1946  . UPPER GI ENDOSCOPY  06/27/12  . VAGINAL HYSTERECTOMY  1971   and bladder repair.  Still has ovaries    Family History  Problem Relation Age of Onset  . Heart disease Mother   . Hypertension Mother   . Hypertension Sister   . Heart disease Brother   . Lung cancer Brother        lung  . Diabetes Maternal Grandfather    Social History:  reports that she has never smoked. She has never used  smokeless tobacco. She reports that she does not drink alcohol or use drugs.  Allergies:  Allergies  Allergen Reactions  . Iodine Anaphylaxis    IV and topical forms.  Burnard Leigh [Hyoscyamine Sulfate]     Vision problems/pt has glaucoma  . Salmon [Fish Allergy] Hives and Shortness Of Breath  . Shellfish Allergy Anaphylaxis  . Remeron [Mirtazapine] Other (See Comments)    Cause blurred vision and red eyes, pt has glaucoma  . Aspirin Other (See Comments)    Sever stomach pain due to ulcer scaring.  . Ciprofloxacin Diarrhea  . Codeine Nausea And Vomiting  . Darvocet [Propoxyphene N-Acetaminophen] Nausea And Vomiting  . Demerol [Meperidine] Nausea Only  . Dexilant [Dexlansoprazole] Swelling    Redness, swelling and peeling of both feet.  . Diphedryl [Diphenhydramine] Other (See Comments)    Increased pulse/small amount ok  . Doxycycline Hyclate Other (See Comments)    GI intolerance.  Marland Kitchen Epinephrine Other (See Comments)    Breathing problems  . Erythromycin Other (See Comments)    GI intolerance.  Yvette Rack [Cyclobenzaprine] Other (See Comments)    Tingly/prickly sensation.  Marland Kitchen Keflex [Cephalexin] Hives  . Latex Other (See Comments)    Gloves ok.  Skin gets red from elastic in underwear and  latex bandaides.  . Prednisone Other (See Comments)    Headache  . Pylera [Bis Subcit-Metronid-Tetracyc] Swelling    Tongue swelling. Face tingling  . Sulfa Antibiotics Other (See Comments)    Increased pulse, fainting, diarrhea, thrush  . Xylocaine [Lidocaine Hcl]     With epinephrine, given by dentist.  Speeded up heart rate and she passed out (occured twice, at dentist)  . Zoloft [Sertraline Hcl] Swelling and Other (See Comments)    Migraine Swelling of tongue/lip (09/2012)  . Advil [Ibuprofen] Other (See Comments)    Motrin ok with a GI effect.  . Clarithromycin Rash    Started after completing 10 day course of 2000 mg /day     (Not in a hospital admission)  No results found for  this or any previous visit (from the past 48 hour(s)). No results found.  Review of Systems  Constitutional: Negative.   HENT: Negative.   Eyes: Negative.   Respiratory: Negative.   Cardiovascular: Negative.   Gastrointestinal: Negative.   Musculoskeletal: Negative.   Skin: Negative.   Neurological: Negative.   Psychiatric/Behavioral: Positive for depression. Negative for hallucinations, memory loss, substance abuse and suicidal ideas. The patient is not nervous/anxious and does not have insomnia.     Blood pressure (!) 148/64, pulse 69, temperature 97.6 F (36.4 C), temperature source Oral, resp. rate 18, height 4\' 11"  (1.499 m), weight 47.6 kg (105 lb), SpO2 100 %. Physical Exam  Nursing note and vitals reviewed. Constitutional: She appears well-developed and well-nourished.  HENT:  Head: Normocephalic and atraumatic.  Eyes: Pupils are equal, round, and reactive to light. Conjunctivae are normal.  Neck: Normal range of motion.  Cardiovascular: Regular rhythm and normal heart sounds.   Respiratory: Effort normal. No respiratory distress.  GI: Soft.  Musculoskeletal: Normal range of motion.  Neurological: She is alert.  Skin: Skin is warm and dry.  Psychiatric: Her speech is normal and behavior is normal. Judgment and thought content normal. Cognition and memory are normal. She exhibits a depressed mood.     Assessment/Plan Second treatment today. Talked about pain management strategies. Continue current treatment plan.  Alethia Berthold, MD 05/09/2017, 10:33 AM

## 2017-05-09 NOTE — Procedures (Signed)
ECT SERVICES Physician's Interval Evaluation & Treatment Note  Patient Identification: BUFFEY ZABINSKI MRN:  [id number] Date of Evaluation:  05/09/2017 TX #: 2  MADRS:   MMSE:   P.E. Findings:  No change to physical exam. Vitals unremarkable. Heart and lungs normal.  Psychiatric Interval Note:  Patient feeling about the same. Some aches and pains mood still depressed. No major complaints about cognition  Subjective:  Patient is a 83 y.o. female seen for evaluation for Electroconvulsive Therapy. Some soreness but otherwise about the same no change  Treatment Summary:   []   Right Unilateral             [x]  Bilateral   % Energy : 1.0 ms 40%   Impedance: 2070 ohms  Seizure Energy Index: 10,503 V squared 98%8% Postictal Suppression Index: 71%  Seizure Concordance Index: 98%  Medications  Pre Shock: Brevital 50 mg succinylcholine 60 mg  Post Shock:    Seizure Duration: 42 seconds by EMG 58 seconds by EEG   Comments: ontinue Monday Wednesday Friday follow-up scheduled   Lungs:  [x]   Clear to auscultation               []  Other:   Heart:    [x]   Regular rhythm             []  irregular rhythm    [x]   Previous H&P reviewed, patient examined and there are NO CHANGES                 []   Previous H&P reviewed, patient examined and there are changes noted.   Alethia Berthold, MD 10/17/201810:35 AM

## 2017-05-10 ENCOUNTER — Ambulatory Visit: Payer: Medicare Other | Admitting: Family Medicine

## 2017-05-10 ENCOUNTER — Other Ambulatory Visit: Payer: Self-pay | Admitting: Psychiatry

## 2017-05-10 ENCOUNTER — Ambulatory Visit: Payer: Medicare Other | Admitting: Licensed Clinical Social Worker

## 2017-05-11 ENCOUNTER — Encounter: Payer: Self-pay | Admitting: Certified Registered Nurse Anesthetist

## 2017-05-11 ENCOUNTER — Encounter
Admission: RE | Admit: 2017-05-11 | Discharge: 2017-05-11 | Disposition: A | Discharge status: Home / Self Care | Payer: Medicare Other | Source: Ambulatory Visit | Attending: Psychiatry | Admitting: Psychiatry

## 2017-05-11 DIAGNOSIS — H409 Unspecified glaucoma: Secondary | ICD-10-CM | POA: Insufficient documentation

## 2017-05-11 DIAGNOSIS — E039 Hypothyroidism, unspecified: Secondary | ICD-10-CM | POA: Insufficient documentation

## 2017-05-11 DIAGNOSIS — R5382 Chronic fatigue, unspecified: Secondary | ICD-10-CM | POA: Insufficient documentation

## 2017-05-11 DIAGNOSIS — Z87440 Personal history of urinary (tract) infections: Secondary | ICD-10-CM | POA: Insufficient documentation

## 2017-05-11 DIAGNOSIS — F329 Major depressive disorder, single episode, unspecified: Secondary | ICD-10-CM | POA: Diagnosis not present

## 2017-05-11 DIAGNOSIS — K219 Gastro-esophageal reflux disease without esophagitis: Secondary | ICD-10-CM | POA: Diagnosis not present

## 2017-05-11 DIAGNOSIS — K589 Irritable bowel syndrome without diarrhea: Secondary | ICD-10-CM | POA: Insufficient documentation

## 2017-05-11 DIAGNOSIS — M797 Fibromyalgia: Secondary | ICD-10-CM | POA: Diagnosis not present

## 2017-05-11 DIAGNOSIS — F332 Major depressive disorder, recurrent severe without psychotic features: Secondary | ICD-10-CM

## 2017-05-11 DIAGNOSIS — F410 Panic disorder [episodic paroxysmal anxiety] without agoraphobia: Secondary | ICD-10-CM | POA: Insufficient documentation

## 2017-05-11 DIAGNOSIS — R109 Unspecified abdominal pain: Secondary | ICD-10-CM | POA: Diagnosis not present

## 2017-05-11 DIAGNOSIS — G8929 Other chronic pain: Secondary | ICD-10-CM | POA: Diagnosis not present

## 2017-05-11 DIAGNOSIS — F419 Anxiety disorder, unspecified: Secondary | ICD-10-CM | POA: Diagnosis not present

## 2017-05-11 MED ORDER — SODIUM CHLORIDE 0.9 % IV SOLN
INTRAVENOUS | Status: DC | PRN
  Administered 2017-05-11: 10:00:00 via INTRAVENOUS

## 2017-05-11 MED ORDER — METHOHEXITAL SODIUM 0.5 G IJ SOLR
INTRAMUSCULAR | Status: AC
  Filled 2017-05-11: qty 500

## 2017-05-11 MED ORDER — METHOHEXITAL SODIUM 100 MG/10ML IV SOSY
PREFILLED_SYRINGE | INTRAVENOUS | Status: DC | PRN
  Administered 2017-05-11: 50 mg via INTRAVENOUS

## 2017-05-11 MED ORDER — SODIUM CHLORIDE 0.9 % IV SOLN
500.0000 mL | Freq: Once | INTRAVENOUS | Status: AC
  Administered 2017-05-11: 500 mL via INTRAVENOUS

## 2017-05-11 MED ORDER — SUCCINYLCHOLINE CHLORIDE 20 MG/ML IJ SOLN
INTRAMUSCULAR | Status: DC | PRN
  Administered 2017-05-11: 60 mg via INTRAVENOUS

## 2017-05-11 NOTE — Procedures (Signed)
ECT SERVICES Physician's Interval Evaluation & Treatment Note  Patient Identification: Tracy Malone MRN:  [id number] Date of Evaluation:  05/11/2017 TX #: 3  MADRS:   MMSE:   P.E. Findings:  Unchanged physical exam  Psychiatric Interval Note:  Affect euthymic mood slightly depressed still  Subjective:  Patient is a 83 y.o. female seen for evaluation for Electroconvulsive Therapy. Some possible slight improvement  Treatment Summary:   []   Right Unilateral             [x]  Bilateral   % Energy : 1.0 ms 40%   Impedance: 1760 ohms  Seizure Energy Index: 9049 V square  Postictal Suppression Index: 81%  Seizure Concordance Index: 94%  Medications  Pre Shock: Brevital 50 mg, succinylcholine 60 mg  Post Shock:    Seizure Duration: 44 seconds by EMG 63 seconds by EEG   Comments: Tolerating treatment well.  Spoke with patient and family today.  Continue 3 times a week schedule into the next week.  Lungs:  [x]   Clear to auscultation               []  Other:   Heart:    [x]   Regular rhythm             []  irregular rhythm    [x]   Previous H&P reviewed, patient examined and there are NO CHANGES                 []   Previous H&P reviewed, patient examined and there are changes noted.   Alethia Berthold, MD 10/19/201810:20 AM

## 2017-05-11 NOTE — Anesthesia Preprocedure Evaluation (Signed)
Anesthesia Evaluation  Patient identified by MRN, date of birth, ID band Patient awake    Reviewed: Allergy & Precautions, H&P , NPO status , Patient's Chart, lab work & pertinent test results  History of Anesthesia Complications Negative for: history of anesthetic complications  Airway Mallampati: III  TM Distance: <3 FB Neck ROM: limited    Dental  (+) Poor Dentition, Chipped, Missing   Pulmonary neg pulmonary ROS, neg shortness of breath,           Cardiovascular Exercise Tolerance: Good (-) angina+ Peripheral Vascular Disease  (-) Past MI and (-) DOE + dysrhythmias (PVCs)      Neuro/Psych  Headaches, PSYCHIATRIC DISORDERS Anxiety Depression  Neuromuscular disease negative psych ROS   GI/Hepatic Neg liver ROS, hiatal hernia, PUD, GERD  Medicated and Controlled,  Endo/Other  Hypothyroidism   Renal/GU negative Renal ROS  negative genitourinary   Musculoskeletal  (+) Fibromyalgia -  Abdominal   Peds  Hematology negative hematology ROS (+)   Anesthesia Other Findings Past Medical History: 1966: Bell's palsy     Comment:  ? right side facial droop 2010: Carotid artery disease ([place])     Comment:  on vascular screening;unchanged 2013.(could not tolerate              simvastatin, no other statins tried)--<30% blockage bilat              07/2011 No date: Chronic abdominal pain No date: Chronic fatigue and malaise No date: Claustrophobia No date: Depression     Comment:  treated in the past for years;stopped in 2010 for a               years 1962: Duodenal ulcer     Comment:  h/o No date: Dysrhythmia     Comment:  ocassional PVC's No date: Fibromyalgia 07/2012: Frequent PVCs     Comment:  Seen by [facility]: benign, asymptomatic, normal EF No date: GERD (gastroesophageal reflux disease) No date: Glaucoma, narrow-angle     Comment:  s/p laser surgery No date: History of hiatal hernia     Comment:  during  endoscopy 9/08: Hypothyroid No date: IBS (irritable bowel syndrome)     Comment:  Dr. Benson Norway No date: Ocular migraine 10/11: Osteoporosis     Comment:  Dr.Hawkes No date: Panic attack No date: Recurrent UTI     Comment:  has cystocele-Dr.Grewal 1999: Shingles     Comment:  h/o 03/2009: Superficial thrombophlebitis     Comment:  RLE 12/2008: Trochanteric bursitis     Comment:  bilateral  Past Surgical History: No date: [place]: CATARACT EXTRACTION, BILATERAL No date: Flexible sigmoidoscopy 09/2005: THYROIDECTOMY, PARTIAL     Comment:  L nodule; Dr. Harlow Asa 1946: TONSILLECTOMY 06/27/12: UPPER GI ENDOSCOPY 1971: VAGINAL HYSTERECTOMY     Comment:  and bladder repair.  Still has ovaries  BMI    Body Mass Index:  21.01 kg/m      Reproductive/Obstetrics negative OB ROS                             Anesthesia Physical  Anesthesia Plan  ASA: III  Anesthesia Plan: General   Post-op Pain Management:    Induction: Intravenous  PONV Risk Score and Plan:   Airway Management Planned: Natural Airway, Nasal Cannula and Mask  Additional Equipment:   Intra-op Plan:   Post-operative Plan:   Informed Consent: I have reviewed the patients History and  Physical, chart, labs and discussed the procedure including the risks, benefits and alternatives for the proposed anesthesia with the patient or authorized representative who has indicated his/her understanding and acceptance.   Dental Advisory Given  Plan Discussed with: Anesthesiologist, CRNA and Surgeon  Anesthesia Plan Comments: (Patient consented for risks of anesthesia including but not limited to:  - adverse reactions to medications - risk of intubation if required - damage to teeth, lips or other oral mucosa - sore throat or hoarseness - Damage to heart, brain, lungs or loss of life  Patient voiced understanding.)        Anesthesia Quick Evaluation

## 2017-05-11 NOTE — Anesthesia Procedure Notes (Signed)
Performed by: Alicianna Litchford Pre-anesthesia Checklist: Patient identified, Patient being monitored, Timeout performed, Emergency Drugs available and Suction available Patient Re-evaluated:Patient Re-evaluated prior to inductionOxygen Delivery Method: Circle system utilized Preoxygenation: Pre-oxygenation with 100% oxygen Ventilation: Mask ventilation without difficulty Airway Equipment and Method: Bite block Dental Injury: Teeth and Oropharynx as per pre-operative assessment        

## 2017-05-11 NOTE — Transfer of Care (Signed)
Immediate Anesthesia Transfer of Care Note  Patient: Tracy Malone  Procedure(s) Performed: ECT TX  Patient Location: PACU  Anesthesia Type:General  Level of Consciousness: sedated  Airway & Oxygen Therapy: Patient Spontanous Breathing and Patient connected to face mask oxygen  Post-op Assessment: Report given to RN and Post -op Vital signs reviewed and stable  Post vital signs: Reviewed and stable  Last Vitals:  Vitals:   05/11/17 0850 05/11/17 1033  BP: (!) 150/75 136/62  Pulse:  84  Resp: 18 12  Temp: 36.9 C 37.1 C  SpO2:  98%    Last Pain:  Vitals:   05/11/17 0902  TempSrc:   PainSc: 0-No pain         Complications: No apparent anesthesia complications

## 2017-05-11 NOTE — Anesthesia Post-op Follow-up Note (Signed)
Anesthesia QCDR form completed.        

## 2017-05-11 NOTE — H&P (Signed)
Tracy Malone is an 83 y.o. female.   Chief Complaint: No specific complaint HPI: Patient with recurrent severe depression today is treatment #3 of ECT.  Tolerating treatment fairly well so far.  Past Medical History:  Diagnosis Date  . Bell's palsy 1966   ? right side facial droop  . Carotid artery disease ([place]) 2010   on vascular screening;unchanged 2013.(could not tolerate simvastatin, no other statins tried)--<30% blockage bilat 07/2011  . Chronic abdominal pain   . Chronic fatigue and malaise   . Claustrophobia   . Depression    treated in the past for years;stopped in 2010 for a years  . Duodenal ulcer 1962   h/o  . Dysrhythmia    ocassional PVC's  . Fibromyalgia   . Frequent PVCs 07/2012   Seen by [facility]: benign, asymptomatic, normal EF  . GERD (gastroesophageal reflux disease)   . Glaucoma, narrow-angle    s/p laser surgery  . History of hiatal hernia    during endoscopy  . Hypothyroid 9/08  . IBS (irritable bowel syndrome)    Dr. Benson Norway  . Ocular migraine   . Osteoporosis 10/11   Dr.Hawkes  . Panic attack   . Recurrent UTI    has cystocele-Dr.Grewal  . Shingles 1999   h/o  . Superficial thrombophlebitis 03/2009   RLE  . Trochanteric bursitis 12/2008   bilateral    Past Surgical History:  Procedure Laterality Date  . ABDOMINAL HYSTERECTOMY    . CATARACT EXTRACTION, BILATERAL  1995, 1996  . Flexible sigmoidoscopy    . THYROIDECTOMY, PARTIAL  09/2005   L nodule; Dr. Harlow Asa  . TONSILLECTOMY  1946  . UPPER GI ENDOSCOPY  06/27/12  . VAGINAL HYSTERECTOMY  1971   and bladder repair.  Still has ovaries    Family History  Problem Relation Age of Onset  . Heart disease Mother   . Hypertension Mother   . Hypertension Sister   . Heart disease Brother   . Lung cancer Brother        lung  . Diabetes Maternal Grandfather    Social History:  reports that she has never smoked. She has never used smokeless tobacco. She reports that she does not drink  alcohol or use drugs.  Allergies:  Allergies  Allergen Reactions  . Iodine Anaphylaxis    IV and topical forms.  Burnard Leigh [Hyoscyamine Sulfate]     Vision problems/pt has glaucoma  . Salmon [Fish Allergy] Hives and Shortness Of Breath  . Shellfish Allergy Anaphylaxis  . Remeron [Mirtazapine] Other (See Comments)    Cause blurred vision and red eyes, pt has glaucoma  . Aspirin Other (See Comments)    Sever stomach pain due to ulcer scaring.  . Ciprofloxacin Diarrhea  . Codeine Nausea And Vomiting  . Darvocet [Propoxyphene N-Acetaminophen] Nausea And Vomiting  . Demerol [Meperidine] Nausea Only  . Dexilant [Dexlansoprazole] Swelling    Redness, swelling and peeling of both feet.  . Diphedryl [Diphenhydramine] Other (See Comments)    Increased pulse/small amount ok  . Doxycycline Hyclate Other (See Comments)    GI intolerance.  Marland Kitchen Epinephrine Other (See Comments)    Breathing problems  . Erythromycin Other (See Comments)    GI intolerance.  Tracy Malone [Cyclobenzaprine] Other (See Comments)    Tingly/prickly sensation.  Marland Kitchen Keflex [Cephalexin] Hives  . Latex Other (See Comments)    Gloves ok.  Skin gets red from elastic in underwear and latex bandaides.  . Prednisone Other (See Comments)  Headache  . Pylera [Bis Subcit-Metronid-Tetracyc] Swelling    Tongue swelling. Face tingling  . Sulfa Antibiotics Other (See Comments)    Increased pulse, fainting, diarrhea, thrush  . Xylocaine [Lidocaine Hcl]     With epinephrine, given by dentist.  Speeded up heart rate and she passed out (occured twice, at dentist)  . Zoloft [Sertraline Hcl] Swelling and Other (See Comments)    Migraine Swelling of tongue/lip (09/2012)  . Advil [Ibuprofen] Other (See Comments)    Motrin ok with a GI effect.  . Clarithromycin Rash    Started after completing 10 day course of 2000 mg /day     (Not in a hospital admission)  No results found for this or any previous visit (from the past 48  hour(s)). No results found.  Review of Systems  Constitutional: Negative.   HENT: Negative.   Eyes: Negative.   Respiratory: Negative.   Cardiovascular: Negative.   Gastrointestinal: Negative.   Musculoskeletal: Negative.   Skin: Negative.   Neurological: Negative.   Psychiatric/Behavioral: Positive for depression and memory loss. Negative for hallucinations, substance abuse and suicidal ideas. The patient is not nervous/anxious and does not have insomnia.     Blood pressure (!) 150/75, temperature 98.4 F (36.9 C), temperature source Oral, resp. rate 18, height 4\' 11"  (1.499 m), weight 106 lb (48.1 kg). Physical Exam  Nursing note and vitals reviewed. Constitutional: She appears well-developed and well-nourished.  HENT:  Head: Normocephalic and atraumatic.  Eyes: Pupils are equal, round, and reactive to light. Conjunctivae are normal.  Neck: Normal range of motion.  Cardiovascular: Regular rhythm and normal heart sounds.   Respiratory: Effort normal and breath sounds normal. No respiratory distress.  GI: Soft.  Musculoskeletal: Normal range of motion.  Neurological: She is alert.  Skin: Skin is warm and dry.  Psychiatric: She has a normal mood and affect. Her behavior is normal. Judgment and thought content normal.     Assessment/Plan Continue 3 times a week schedule into next week with continued monitoring of any improvement.  Alethia Berthold, MD 05/11/2017, 10:18 AM

## 2017-05-11 NOTE — Discharge Instructions (Signed)
1)  The drugs that you have been given will stay in your system until tomorrow so for the       next 24 hours you should not:  A. Drive an automobile  B. Make any legal decisions  C. Drink any alcoholic beverages  2)  You may resume your regular meals upon return home.  3)  A responsible adult must take you home.  Someone should stay with you for a few          hours, then be available by phone for the remainder of the treatment day.  4)  You May experience any of the following symptoms:  Headache, Nausea and a dry mouth (due to the medications you were given),  temporary memory loss and some confusion, or sore muscles (a warm bath  should help this).  If you you experience any of these symptoms let us know on                your return visit.  5)  Report any of the following: any acute discomfort, severe headache, or temperature        greater than 100.5 F.   Also report any unusual redness, swelling, drainage, or pain         at your IV site.    You may report Symptoms to:  [place] [facility]          Phone: [phone], ECT Department           or Dr. Prescott Gum office [phone]  6)  Your next ECT Treatment is Monday May 14, 2017   We will call 2 days prior to your scheduled appointment for arrival times.  7)  Nothing to eat or drink after midnight the night before your procedure.  8)  Take      With a sip of water the morning of your procedure.  9)  Other Instructions: Call [phone] to cancel the morning of your procedure due         to illness or emergency.  10) We will call within 72 hours to assess how you are feeling.

## 2017-05-12 DIAGNOSIS — Z23 Encounter for immunization: Secondary | ICD-10-CM | POA: Diagnosis not present

## 2017-05-14 ENCOUNTER — Encounter: Payer: Self-pay | Admitting: Anesthesiology

## 2017-05-14 ENCOUNTER — Inpatient Hospital Stay: Admission: RE | Admit: 2017-05-14 | Payer: Self-pay | Source: Ambulatory Visit

## 2017-05-14 ENCOUNTER — Telehealth: Payer: Self-pay

## 2017-05-14 NOTE — Anesthesia Postprocedure Evaluation (Signed)
Anesthesia Post Note  Patient: Tracy Malone  Procedure(s) Performed: ECT TX  Patient location during evaluation: PACU Anesthesia Type: General Level of consciousness: awake and alert Pain management: pain level controlled Vital Signs Assessment: post-procedure vital signs reviewed and stable Respiratory status: spontaneous breathing, nonlabored ventilation, respiratory function stable and patient connected to nasal cannula oxygen Cardiovascular status: blood pressure returned to baseline and stable Postop Assessment: no apparent nausea or vomiting Anesthetic complications: no     Last Vitals:  Vitals:   05/11/17 1103 05/11/17 1118  BP: (!) 129/53 134/73  Pulse: 86 79  Resp: 20 20  Temp: 36.7 C 36.7 C  SpO2: 96%     Last Pain:  Vitals:   05/11/17 1118  TempSrc: Oral  PainSc:                  Martha Clan

## 2017-05-21 ENCOUNTER — Encounter: Payer: Self-pay | Admitting: *Deleted

## 2017-05-22 ENCOUNTER — Telehealth (HOSPITAL_COMMUNITY): Payer: Self-pay | Admitting: Psychiatry

## 2017-05-22 NOTE — Telephone Encounter (Signed)
She should not be following up with me at all, she is [facility] patient.  We can cancel the visit. The follow-up visit was for [place], but she is not a [place] candidate

## 2017-05-24 ENCOUNTER — Other Ambulatory Visit: Payer: Self-pay | Admitting: Radiology

## 2017-05-24 DIAGNOSIS — N644 Mastodynia: Secondary | ICD-10-CM | POA: Diagnosis not present

## 2017-05-29 DIAGNOSIS — E039 Hypothyroidism, unspecified: Secondary | ICD-10-CM | POA: Diagnosis not present

## 2017-05-30 ENCOUNTER — Ambulatory Visit: Payer: Self-pay

## 2017-05-30 ENCOUNTER — Ambulatory Visit
Admission: RE | Admit: 2017-05-30 | Discharge: 2017-05-30 | Disposition: A | Discharge status: Home / Self Care | Payer: Medicare Other | Source: Ambulatory Visit | Attending: Radiology | Admitting: Radiology

## 2017-05-30 DIAGNOSIS — R928 Other abnormal and inconclusive findings on diagnostic imaging of breast: Secondary | ICD-10-CM | POA: Diagnosis not present

## 2017-05-30 DIAGNOSIS — N644 Mastodynia: Secondary | ICD-10-CM

## 2017-06-01 DIAGNOSIS — E039 Hypothyroidism, unspecified: Secondary | ICD-10-CM | POA: Diagnosis not present

## 2017-06-01 DIAGNOSIS — E063 Autoimmune thyroiditis: Secondary | ICD-10-CM | POA: Diagnosis not present

## 2017-06-03 ENCOUNTER — Encounter: Payer: Self-pay | Admitting: Family Medicine

## 2017-06-05 DIAGNOSIS — M7551 Bursitis of right shoulder: Secondary | ICD-10-CM | POA: Diagnosis not present

## 2017-06-11 ENCOUNTER — Encounter: Payer: Self-pay | Admitting: Neurology

## 2017-06-11 DIAGNOSIS — H0100B Unspecified blepharitis left eye, upper and lower eyelids: Secondary | ICD-10-CM | POA: Diagnosis not present

## 2017-06-11 DIAGNOSIS — H52203 Unspecified astigmatism, bilateral: Secondary | ICD-10-CM | POA: Diagnosis not present

## 2017-06-11 DIAGNOSIS — H43813 Vitreous degeneration, bilateral: Secondary | ICD-10-CM | POA: Diagnosis not present

## 2017-06-11 DIAGNOSIS — H0100A Unspecified blepharitis right eye, upper and lower eyelids: Secondary | ICD-10-CM | POA: Diagnosis not present

## 2017-06-13 ENCOUNTER — Ambulatory Visit (INDEPENDENT_AMBULATORY_CARE_PROVIDER_SITE_OTHER): Payer: Medicare Other | Admitting: Psychiatry

## 2017-06-13 DIAGNOSIS — Z5329 Procedure and treatment not carried out because of patient's decision for other reasons: Secondary | ICD-10-CM

## 2017-06-13 NOTE — Progress Notes (Signed)
Patient requesting to be seen sooner.  Offered appointment this AM, VM left. Patient unable to make it. Will follow-up as scheduled in December.

## 2017-06-21 ENCOUNTER — Encounter: Payer: Self-pay | Admitting: Family Medicine

## 2017-06-22 ENCOUNTER — Encounter: Payer: Self-pay | Admitting: Podiatry

## 2017-06-22 ENCOUNTER — Ambulatory Visit (INDEPENDENT_AMBULATORY_CARE_PROVIDER_SITE_OTHER): Payer: Medicare Other | Admitting: Podiatry

## 2017-06-22 DIAGNOSIS — B351 Tinea unguium: Secondary | ICD-10-CM

## 2017-06-22 DIAGNOSIS — Q828 Other specified congenital malformations of skin: Secondary | ICD-10-CM | POA: Diagnosis not present

## 2017-06-22 DIAGNOSIS — M79676 Pain in unspecified toe(s): Secondary | ICD-10-CM

## 2017-06-22 NOTE — Progress Notes (Signed)
Complaint:  Visit Type: Patient returns to my office for continued preventative foot care services. Complaint: Patient states" my nails have grown long and thick and become painful to walk and wear shoes" . The patient presents for preventative foot care services. No changes to ROS.  Patient has painful callus both feet.  Podiatric Exam: Vascular: dorsalis pedis and posterior tibial pulses are palpable bilateral. Capillary return is immediate. Temperature gradient is WNL. Skin turgor WNL  Sensorium: Normal Semmes Weinstein monofilament test. Normal tactile sensation bilaterally. Nail Exam: Pt has thick disfigured discolored nails with subungual debris noted bilateral entire nail hallux through fifth toenails Ulcer Exam: There is no evidence of ulcer or pre-ulcerative changes or infection. Orthopedic Exam: Muscle tone and strength are WNL. No limitations in general ROM. No crepitus or effusions noted. Foot type and digits show no abnormalities. Bony prominences are unremarkable. Skin:  Porokeratosis sub 1 left and sub 5th  B/L.   No infection or ulcers  Diagnosis:  Onychomycosis, , Pain in right toe, pain in left toes,  Porokeratosis  Treatment & Plan Procedures and Treatment: Consent by patient was obtained for treatment procedures.   Debridement of mycotic and hypertrophic toenails, 1 through 5 bilateral and clearing of subungual debris. No ulceration, no infection noted. Debride porokeratosis  B/L Return Visit-Office Procedure: Patient instructed to return to the office for a follow up visit 3 months for continued evaluation and treatment.    Gardiner Barefoot DPM

## 2017-06-25 ENCOUNTER — Encounter (HOSPITAL_COMMUNITY): Payer: Self-pay | Admitting: Psychiatry

## 2017-06-26 ENCOUNTER — Encounter (HOSPITAL_COMMUNITY): Payer: Self-pay | Admitting: Psychiatry

## 2017-06-27 ENCOUNTER — Ambulatory Visit (INDEPENDENT_AMBULATORY_CARE_PROVIDER_SITE_OTHER): Payer: Medicare Other | Admitting: Family Medicine

## 2017-06-27 ENCOUNTER — Encounter: Payer: Self-pay | Admitting: Family Medicine

## 2017-06-27 VITALS — BP 134/70 | HR 64 | Temp 97.8°F | Ht 59.0 in | Wt 106.6 lb

## 2017-06-27 DIAGNOSIS — I2510 Atherosclerotic heart disease of native coronary artery without angina pectoris: Secondary | ICD-10-CM | POA: Diagnosis not present

## 2017-06-27 DIAGNOSIS — R300 Dysuria: Secondary | ICD-10-CM

## 2017-06-27 DIAGNOSIS — N3001 Acute cystitis with hematuria: Secondary | ICD-10-CM

## 2017-06-27 DIAGNOSIS — F339 Major depressive disorder, recurrent, unspecified: Secondary | ICD-10-CM

## 2017-06-27 LAB — POCT URINALYSIS DIP (PROADVANTAGE DEVICE)
BILIRUBIN UA: NEGATIVE mg/dL
Bilirubin, UA: NEGATIVE
Glucose, UA: NEGATIVE mg/dL
Nitrite, UA: NEGATIVE
PH UA: 5.5 (ref 5.0–8.0)
SPECIFIC GRAVITY, URINE: 1.02
UUROB: NEGATIVE

## 2017-06-27 MED ORDER — NITROFURANTOIN MONOHYD MACRO 100 MG PO CAPS: 100.0000 mg | ORAL_CAPSULE | Freq: Two times a day (BID) | ORAL | 0 refills | Status: DC

## 2017-06-27 NOTE — Progress Notes (Signed)
Chief Complaint  Patient presents with  . Dysuria    burning with urination, lbp and bladder felt swollen. Started about 3 days ago.    Started with "swelling of the bladder" about a week ago, low back pain and fatigue.  2 days ago she started with burning with urination. She has no urgency, but increased pain at the end of void.  She isn't voiding as much, but admits she isn't drinking as much water.  She recalls taking cipro for a UTI from Dr. Helane Rima a couple of years ago, did fine, with a probiotic.  She also recalls tolerating macrobid without a problem.  Depression:  She had 3 ECT treatments.  Stopped due to memory loss. Feels some improvement, "not 100%".  PMH, PSH, SH reviewed  Outpatient Encounter Medications as of 06/27/2017  Medication Sig Note  . ALPRAZolam (XANAX) 0.5 MG tablet Take 0.5 mg by mouth 3 (three) times daily.  01/18/2017: tid  . b complex vitamins capsule Take 1 capsule by mouth as needed.  10/26/2016: daily  . Cholecalciferol (VITAMIN D) 2000 UNITS tablet Take 2,000 Units by mouth daily. 05/04/2015: Taking daily  . SYNTHROID 50 MCG tablet Take 50 mg by mouth every morning.   . vitamin E 400 UNIT capsule Take 400 Units by mouth every Monday, Wednesday, and Friday. Reported on 09/06/2015   . acetaminophen (TYLENOL) 650 MG CR tablet Take 650 mg by mouth as needed for pain. Reported on 09/06/2015   . nitrofurantoin, macrocrystal-monohydrate, (MACROBID) 100 MG capsule Take 1 capsule (100 mg total) by mouth 2 (two) times daily.   . Probiotic Product (ALIGN PO) Take 1 capsule by mouth every other day.     No facility-administered encounter medications on file as of 06/27/2017.    (macrobid rx'd today, not taking prior to visit)  Allergies  Allergen Reactions  . Iodine Anaphylaxis    IV and topical forms.  Burnard Leigh [Hyoscyamine Sulfate]     Vision problems/pt has glaucoma  . Salmon [Fish Allergy] Hives and Shortness Of Breath  . Shellfish Allergy Anaphylaxis  . Remeron  [Mirtazapine] Other (See Comments)    Cause blurred vision and red eyes, pt has glaucoma  . Aspirin Other (See Comments)    Sever stomach pain due to ulcer scaring.  . Ciprofloxacin Diarrhea  . Codeine Nausea And Vomiting  . Darvocet [Propoxyphene N-Acetaminophen] Nausea And Vomiting  . Demerol [Meperidine] Nausea Only  . Dexilant [Dexlansoprazole] Swelling    Redness, swelling and peeling of both feet.  . Diphedryl [Diphenhydramine] Other (See Comments)    Increased pulse/small amount ok  . Doxycycline Hyclate Other (See Comments)    GI intolerance.  Marland Kitchen Epinephrine Other (See Comments)    Breathing problems  . Erythromycin Other (See Comments)    GI intolerance.  Yvette Rack [Cyclobenzaprine] Other (See Comments)    Tingly/prickly sensation.  Marland Kitchen Keflex [Cephalexin] Hives  . Latex Other (See Comments)    Gloves ok.  Skin gets red from elastic in underwear and latex bandaides.  . Prednisone Other (See Comments)    Headache  . Pylera [Bis Subcit-Metronid-Tetracyc] Swelling    Tongue swelling. Face tingling  . Sulfa Antibiotics Other (See Comments)    Increased pulse, fainting, diarrhea, thrush  . Xylocaine [Lidocaine Hcl]     With epinephrine, given by dentist.  Speeded up heart rate and she passed out (occured twice, at dentist)  . Zoloft [Sertraline Hcl] Swelling and Other (See Comments)    Migraine Swelling of tongue/lip (09/2012)  .  Advil [Ibuprofen] Other (See Comments)    Motrin ok with a GI effect.  . Clarithromycin Rash    Started after completing 10 day course of 2000 mg /day   ROS: No fever, chills.  She has had a headache in the morning for the last week, at the top of her head and on the sides.  She denies sinus or allergy symptoms. She is seeing dentist tomorrow because she chipped a tooth. No nausea or vomiting, no diarrhea.  +constipation. Denies any vaginal discharge, bleeding, spotting. Denies flank pain, but has pain in her lower back  PHYSICAL EXAM:  BP  134/70   Pulse 64   Temp 97.8 F (36.6 C) (Oral)   Ht 4\' 11"  (1.499 m)   Wt 106 lb 9.6 oz (48.4 kg)   BMI 21.53 kg/m   Well appearing, pleasant female (apologizing for being "a pain"). Neck: no lymphadenopathy or thyromegaly or mass Heart: regular rate and rhythm Lungs: clear bilaterally Back: no CVA tenderness. nontender over the lower back where she feels discomfort Abdomen: mild diffuse tenderness along the lower abdomen, no mass. No upper abdominal tenderness, organomegaly or mass Psych: she has full range of affect, smiling some, making jokes, but appearing tired. Neuro: alert and oriented.  Resting tremor of right hand noted.  Urine dip: large blood, 3+ leuks, 30 protein (trace)   ASSESSMENT/PLAN:  Acute cystitis with hematuria - many allergies, tolerated macrobid, and cipro, if needed to change - Plan: Urine Culture, nitrofurantoin, macrocrystal-monohydrate, (MACROBID) 100 MG capsule  Burning with urination - Plan: POCT Urinalysis DIP (Proadvantage Device)  Episode of recurrent major depressive disorder, unspecified depression episode severity ([place]) - some improvement with few sessions of ECT.  Still also has a lot of anxiety. Under care of psych   Urine culture macrobid  She has tolerated macrobid in the past. She has also had cipro--caused diarrhea at the higher doses, but she is willing to retry (at the lower dose) if culture dictates a need for change.

## 2017-06-27 NOTE — Patient Instructions (Signed)
Drink plenty of water. Take the antibiotic twice daily for a week. We will contact you through MyChart with the urine culture results, when available.  If the macrobid is the correct antibiotic to treat the infection, you should feel a lot better within 1-2 days. Seek immediate evaluation if you develop high fever, flank pain (mid-back, on either side), vomiting, unable to keep the antibiotic down or other concerns.   Urinary Tract Infection, Adult A urinary tract infection (UTI) is an infection of any part of the urinary tract, which includes the kidneys, ureters, bladder, and urethra. These organs make, store, and get rid of urine in the body. UTI can be a bladder infection (cystitis) or kidney infection (pyelonephritis). What are the causes? This infection may be caused by fungi, viruses, or bacteria. Bacteria are the most common cause of UTIs. This condition can also be caused by repeated incomplete emptying of the bladder during urination. What increases the risk? This condition is more likely to develop if:  You ignore your need to urinate or hold urine for long periods of time.  You do not empty your bladder completely during urination.  You wipe back to front after urinating or having a bowel movement, if you are female.  You are uncircumcised, if you are female.  You are constipated.  You have a urinary catheter that stays in place (indwelling).  You have a weak defense (immune) system.  You have a medical condition that affects your bowels, kidneys, or bladder.  You have diabetes.  You take antibiotic medicines frequently or for long periods of time, and the antibiotics no longer work well against certain types of infections (antibiotic resistance).  You take medicines that irritate your urinary tract.  You are exposed to chemicals that irritate your urinary tract.  You are female.  What are the signs or symptoms? Symptoms of this condition  include:  Fever.  Frequent urination or passing small amounts of urine frequently.  Needing to urinate urgently.  Pain or burning with urination.  Urine that smells bad or unusual.  Cloudy urine.  Pain in the lower abdomen or back.  Trouble urinating.  Blood in the urine.  Vomiting or being less hungry than normal.  Diarrhea or abdominal pain.  Vaginal discharge, if you are female.  How is this diagnosed? This condition is diagnosed with a medical history and physical exam. You will also need to provide a urine sample to test your urine. Other tests may be done, including:  Blood tests.  Sexually transmitted disease (STD) testing.  If you have had more than one UTI, a cystoscopy or imaging studies may be done to determine the cause of the infections. How is this treated? Treatment for this condition often includes a combination of two or more of the following:  Antibiotic medicine.  Other medicines to treat less common causes of UTI.  Over-the-counter medicines to treat pain.  Drinking enough water to stay hydrated.  Follow these instructions at home:  Take over-the-counter and prescription medicines only as told by your health care provider.  If you were prescribed an antibiotic, take it as told by your health care provider. Do not stop taking the antibiotic even if you start to feel better.  Avoid alcohol, caffeine, tea, and carbonated beverages. They can irritate your bladder.  Drink enough fluid to keep your urine clear or pale yellow.  Keep all follow-up visits as told by your health care provider. This is important.  Make sure to: ?  Empty your bladder often and completely. Do not hold urine for long periods of time. ? Empty your bladder before and after sex. ? Wipe from front to back after a bowel movement if you are female. Use each tissue one time when you wipe. Contact a health care provider if:  You have back pain.  You have a fever.  You  feel nauseous or vomit.  Your symptoms do not get better after 3 days.  Your symptoms go away and then return. Get help right away if:  You have severe back pain or lower abdominal pain.  You are vomiting and cannot keep down any medicines or water. This information is not intended to replace advice given to you by your health care provider. Make sure you discuss any questions you have with your health care provider. Document Released: 04/19/2005 Document Revised: 12/22/2015 Document Reviewed: 05/31/2015 Elsevier Interactive Patient Education  2017 [employer].

## 2017-06-30 LAB — URINE CULTURE
MICRO NUMBER: 81368031
SPECIMEN QUALITY: ADEQUATE

## 2017-07-01 ENCOUNTER — Encounter: Payer: Self-pay | Admitting: Family Medicine

## 2017-07-01 MED ORDER — CIPROFLOXACIN HCL 250 MG PO TABS: 250.0000 mg | ORAL_TABLET | Freq: Two times a day (BID) | ORAL | 0 refills | Status: DC

## 2017-07-01 NOTE — Addendum Note (Signed)
Addended by: [name] on: 07/01/2017 08:08 AM   Modules accepted: Orders

## 2017-07-06 ENCOUNTER — Ambulatory Visit: Payer: Self-pay | Admitting: Neurology

## 2017-07-11 ENCOUNTER — Encounter: Payer: Self-pay | Admitting: Neurology

## 2017-07-11 ENCOUNTER — Ambulatory Visit (INDEPENDENT_AMBULATORY_CARE_PROVIDER_SITE_OTHER): Payer: Medicare Other | Admitting: Psychiatry

## 2017-07-11 ENCOUNTER — Other Ambulatory Visit (INDEPENDENT_AMBULATORY_CARE_PROVIDER_SITE_OTHER): Payer: Medicare Other

## 2017-07-11 ENCOUNTER — Encounter (HOSPITAL_COMMUNITY): Payer: Self-pay | Admitting: Psychiatry

## 2017-07-11 ENCOUNTER — Telehealth (HOSPITAL_COMMUNITY): Payer: Self-pay

## 2017-07-11 ENCOUNTER — Ambulatory Visit (HOSPITAL_COMMUNITY): Payer: Self-pay | Admitting: Psychiatry

## 2017-07-11 VITALS — BP 122/70 | HR 88 | Ht 59.0 in | Wt 105.4 lb

## 2017-07-11 DIAGNOSIS — F330 Major depressive disorder, recurrent, mild: Secondary | ICD-10-CM

## 2017-07-11 DIAGNOSIS — N3001 Acute cystitis with hematuria: Secondary | ICD-10-CM

## 2017-07-11 DIAGNOSIS — Z79899 Other long term (current) drug therapy: Secondary | ICD-10-CM

## 2017-07-11 DIAGNOSIS — G20 Parkinson's disease: Secondary | ICD-10-CM | POA: Diagnosis not present

## 2017-07-11 DIAGNOSIS — R300 Dysuria: Secondary | ICD-10-CM | POA: Diagnosis not present

## 2017-07-11 DIAGNOSIS — F410 Panic disorder [episodic paroxysmal anxiety] without agoraphobia: Secondary | ICD-10-CM | POA: Diagnosis not present

## 2017-07-11 DIAGNOSIS — R251 Tremor, unspecified: Secondary | ICD-10-CM

## 2017-07-11 LAB — POCT URINALYSIS DIP (PROADVANTAGE DEVICE)
BILIRUBIN UA: NEGATIVE mg/dL
Bilirubin, UA: NEGATIVE
Glucose, UA: NEGATIVE mg/dL
Nitrite, UA: NEGATIVE
PH UA: 6 (ref 5.0–8.0)
PROTEIN UA: NEGATIVE mg/dL
SPECIFIC GRAVITY, URINE: 1.025
Urobilinogen, Ur: NEGATIVE

## 2017-07-11 MED ORDER — ALPRAZOLAM 0.5 MG PO TABS: 0.5000 mg | ORAL_TABLET | Freq: Three times a day (TID) | ORAL | 1 refills | Status: DC

## 2017-07-11 MED ORDER — PROPRANOLOL HCL ER 60 MG PO CP24: 60.0000 mg | ORAL_CAPSULE | Freq: Every day | ORAL | 1 refills | Status: DC

## 2017-07-11 NOTE — Telephone Encounter (Signed)
No she can take at same time

## 2017-07-11 NOTE — Progress Notes (Signed)
BH MD/PA/NP OP Progress Note  07/11/2017 11:11 AM Tracy Malone  MRN:  [id number]  Chief Complaint:  a little better, less of the crying spells   HPI: Tracy Malone presents today for psychiatric transfer of care.  She is known to this Probation officer from [facility] assessment.  The patient continues to have difficulty processing her diagnosis of Parkinson's disease.  She appears far less labile and tearful than our previous encounters.  She was able to tolerate 3 treatments of ECT and per discussion with her husband and daughter who are also present, there has been an improvement in her mood lability and episodes of tearfulness.  She does seem to be less depressed, and closer to her baseline in terms of mood.  She does continue to have periodic episodes of tearfulness but they are short-lived and she is able to respond to comforting.  We discussed treatment options if she has a flare in depressive symptoms, including the use of selegiline Emsam patch, but reviewed that this is an MAOI and carries substantial risks of side effects.  We discussed that this is also potentially helpful for Parkinson symptoms because of the action of increasing endogenous dopamine.  The patient would like to avoid any medications oral or otherwise.  Given that she has had some positive improvements with ECT, we discussed the risks and benefits of considering a repeat Roberts treatment, and doing a re-mapping.  Patient is open to this if necessary.  Regarding Xanax, I reviewed the risks with her that this is of course a habit forming medication, does increase the risk of falls and confusion, but also can be quite beneficial for anxiety and neuromuscular tension.  Specifically I suggested we consider switching her from Xanax to Lorazepam, she was agreeable to this in the future.  She was specifically wondering about restarting propranolol extended release.  I reviewed the risks of falls and hypotension, and suggested that she monitor for any  side effects, family was on board.  We agreed to restart at the lowest dose of extended release which is propranolol LA 60 mg daily.  She had previously tried this for about a week and felt that it was helpful, but then discontinued due to starting other medicines for her mood and depression.   She does not present with any safety issues or suicidality.  I spent time with the patient providing her education on various illnesses associated with parkinsonism, including Parkinson's disease, Parkinson's plus dementia, multiple systems atrophy, idiopathic parkinsonism related to medications, and spent time processing some of her concerns about the long-term sequelae and outcomes.  I educated her on the associated symptoms of Parkinson's including the pains of unclear etiology, potential for autonomic disturbances, and depression.  She will see her neurologist soon to consider retrial of Sinemet.  Visit Diagnosis:    ICD-10-CM   1. Tremor R25.1 propranolol ER (INDERAL LA) 60 MG 24 hr capsule  2. Panic disorder F41.0 ALPRAZolam (XANAX) 0.5 MG tablet  3. Mild episode of recurrent major depressive disorder (HCC) F33.0   4. Parkinson's disease ([place]) [place]     Past Psychiatric History: 3 tx with ECT, prior [place] last year  Past Medical History:  Past Medical History:  Diagnosis Date  . Bell's palsy 1966   ? right side facial droop  . Carotid artery disease ([place]) 2010   on vascular screening;unchanged 2013.(could not tolerate simvastatin, no other statins tried)--<30% blockage bilat 07/2011  . Chronic abdominal pain   . Chronic fatigue and  malaise   . Claustrophobia   . Depression    treated in the past for years;stopped in 2010 for a years  . Duodenal ulcer 1962   h/o  . Dysrhythmia    ocassional PVC's  . Fibromyalgia   . Frequent PVCs 07/2012   Seen by [facility]: benign, asymptomatic, normal EF  . GERD (gastroesophageal reflux disease)   . Glaucoma, narrow-angle    s/p laser surgery  .  History of hiatal hernia    during endoscopy  . Hypothyroid 9/08  . IBS (irritable bowel syndrome)    Dr. Benson [place]  . Ocular migraine   . Osteoporosis 10/11   Dr.Hawkes  . Panic attack   . Recurrent UTI    has cystocele-Dr.Grewal  . Shingles 1999   h/o  . Superficial thrombophlebitis 03/2009   RLE  . Trochanteric bursitis 12/2008   bilateral    Past Surgical History:  Procedure Laterality Date  . ABDOMINAL HYSTERECTOMY    . CATARACT EXTRACTION, BILATERAL  1995, 1996  . Flexible sigmoidoscopy    . THYROIDECTOMY, PARTIAL  09/2005   L nodule; Dr. Harlow Asa  . TONSILLECTOMY  1946  . UPPER GI ENDOSCOPY  06/27/12  . VAGINAL HYSTERECTOMY  1971   and bladder repair.  Still has ovaries    Family Psychiatric History: See intake H&P for full details. Reviewed, with no updates at this time.   Family History:  Family History  Problem Relation Age of Onset  . Heart disease Mother   . Hypertension Mother   . Hypertension Sister   . Heart disease Brother   . Lung cancer Brother        lung  . Diabetes Maternal Grandfather     Social History:  Social History   Socioeconomic History  . Marital status: Married    Spouse name: Not on file  . Number of children: 2  . Years of education: Not on file  . Highest education level: Not on file  Social Needs  . Financial resource strain: Not on file  . Food insecurity - worry: Not on file  . Food insecurity - inability: Not on file  . Transportation needs - medical: Not on file  . Transportation needs - non-medical: Not on file  Occupational History  . Occupation: retired (school system)  Tobacco Use  . Smoking status: Never Smoker  . Smokeless tobacco: Never Used  Substance and Sexual Activity  . Alcohol use: No    Alcohol/week: 0.0 oz  . Drug use: No  . Sexual activity: No  Other Topics Concern  . Not on file  Social History Narrative   Married.  Son lives in [place]; Daughter [name] lives in [place]; 2 grandchildren     Allergies:  Allergies  Allergen Reactions  . Iodine Anaphylaxis    IV and topical forms.  Burnard Leigh [Hyoscyamine Sulfate]     Vision problems/pt has glaucoma  . Salmon [Fish Allergy] Hives and Shortness Of Breath  . Shellfish Allergy Anaphylaxis  . Remeron [Mirtazapine] Other (See Comments)    Cause blurred vision and red eyes, pt has glaucoma  . Aspirin Other (See Comments)    Sever stomach pain due to ulcer scaring.  . Ciprofloxacin Diarrhea  . Codeine Nausea And Vomiting  . Darvocet [Propoxyphene N-Acetaminophen] Nausea And Vomiting  . Demerol [Meperidine] Nausea Only  . Dexilant [Dexlansoprazole] Swelling    Redness, swelling and peeling of both feet.  . Diphedryl [Diphenhydramine] Other (See Comments)    Increased pulse/small  amount ok  . Doxycycline Hyclate Other (See Comments)    GI intolerance.  Marland Kitchen Epinephrine Other (See Comments)    Breathing problems  . Erythromycin Other (See Comments)    GI intolerance.  Yvette Rack [Cyclobenzaprine] Other (See Comments)    Tingly/prickly sensation.  Marland Kitchen Keflex [Cephalexin] Hives  . Latex Other (See Comments)    Gloves ok.  Skin gets red from elastic in underwear and latex bandaides.  . Prednisone Other (See Comments)    Headache  . Pylera [Bis Subcit-Metronid-Tetracyc] Swelling    Tongue swelling. Face tingling  . Sulfa Antibiotics Other (See Comments)    Increased pulse, fainting, diarrhea, thrush  . Xylocaine [Lidocaine Hcl]     With epinephrine, given by dentist.  Speeded up heart rate and she passed out (occured twice, at dentist)  . Zoloft [Sertraline Hcl] Swelling and Other (See Comments)    Migraine Swelling of tongue/lip (09/2012)  . Advil [Ibuprofen] Other (See Comments)    Motrin ok with a GI effect.  . Clarithromycin Rash    Started after completing 10 day course of 2000 mg /day    Metabolic Disorder Labs: No results found for: HGBA1C, MPG No results found for: PROLACTIN Lab Results  Component Value Date    CHOL 218 (H) 11/23/2016   TRIG 123 11/23/2016   HDL 78 11/23/2016   CHOLHDL 2.8 11/23/2016   VLDL 25 11/23/2016   LDLCALC 115 (H) 11/23/2016   LDLCALC 121 05/04/2015   Lab Results  Component Value Date   TSH 1.81 11/23/2016   TSH 1.817 02/01/2015    Therapeutic Level Labs: No results found for: LITHIUM No results found for: VALPROATE No components found for:  CBMZ  Current Medications: Current Outpatient Medications  Medication Sig Dispense Refill  . acetaminophen (TYLENOL) 650 MG CR tablet Take 650 mg by mouth as needed for pain. Reported on 09/06/2015    . ALPRAZolam (XANAX) 0.5 MG tablet Take 1 tablet (0.5 mg total) by mouth 3 (three) times daily. 90 tablet 1  . b complex vitamins capsule Take 1 capsule by mouth as needed.     . Cholecalciferol (VITAMIN D) 2000 UNITS tablet Take 2,000 Units by mouth daily.    . ciprofloxacin (CIPRO) 250 MG tablet Take 1 tablet (250 mg total) by mouth 2 (two) times daily. 14 tablet 0  . Probiotic Product (ALIGN PO) Take 1 capsule by mouth every other day.     . propranolol ER (INDERAL LA) 60 MG 24 hr capsule Take 1 capsule (60 mg total) by mouth daily. 90 capsule 1  . SYNTHROID 50 MCG tablet Take 50 mg by mouth every morning.  4  . vitamin E 400 UNIT capsule Take 400 Units by mouth every Monday, Wednesday, and Friday. Reported on 09/06/2015     No current facility-administered medications for this visit.      Musculoskeletal: Strength & Muscle Tone: cogwheel and increased Gait & Station: shuffle Patient leans: Front  Psychiatric Specialty Exam: ROS  Blood pressure 122/70, pulse 88, height 4\' 11"  (1.499 m), weight 105 lb 6.4 oz (47.8 kg).Body mass index is 21.29 kg/m.  General Appearance: Casual and Well Groomed  Eye Contact:  Fair  Speech:  Normal Rate  Volume:  Decreased  Mood:  Euthymic  Affect:  Flat  Thought Process:  Goal Directed and Descriptions of Associations: Intact  Orientation:  Full (Time, Place, and Person)  Thought  Content: Logical   Suicidal Thoughts:  No  Homicidal Thoughts:  No  Memory:  Immediate;   Fair  Judgement:  Fair  Insight:  Fair  Psychomotor Activity:  Psychomotor Retardation and Tremor  Concentration:  Concentration: Fair  Recall:  [employer] of Knowledge: Fair  Language: Good  Akathisia:  Negative  Handed:  Right  AIMS (if indicated): not done  Assets:  Communication Skills Desire for Improvement Financial Resources/Insurance Housing  ADL's:  Intact  Cognition: WNL  Sleep:  Good   Screenings: ECT-MADRS     ECT Treatment from 05/07/2017 in [place]  MADRS Total Score  31    Mini-Mental     ECT Treatment from 05/07/2017 in [place]  Total Score (max 30 points )  30    PHQ2-9     Office Visit from 09/26/2016 in [facility] for [facility]ious Disease Office Visit from 05/04/2015 in [facility] Visit from 03/04/2015 in [facility] for Infectious Disease Office Visit from 01/27/2015 in [facility] for Infectious Disease  PHQ-2 Total Score  0  2  0  0       Assessment and Plan:  Tracy Malone is an 83 year old female with idiopathic parkinsonism presenting today for medication management follow-up and transfer of psychiatric care.  Her lability and presentation of pseudobulbar affect has improved as a result of ECT, and family is far less concerned about her episodes of tearfulness.  We spent today processing her ongoing questions and concerns about Parkinson's disease.  We also discussed initiating propranolol to help with her general nervousness and tremulousness, which she reports was partially relieved with propranolol in the past.  I reviewed the risks and benefits of beta-blockers with the patient and family.  I also reviewed the risks and benefits of benzodiazepines with the family and at this point it does appear that the Xanax provides her with  some benefit and comfort, and we will continue as below.  If she has a flare in her depressive symptoms, I have suggested that we proceed with a repeat treatment of [place] and redo her mapping given that she has neurocognitive changes.  1. Tremor   2. Panic disorder   3. Mild episode of recurrent major depressive disorder ([place])   4. Parkinson's disease ([place])     Status of current problems: gradually improving  Labs Ordered: No orders of the defined types were placed in this encounter.   Labs Reviewed: n/a  Collateral Obtained/Records Reviewed: ECT record, family in office  Plan:  Continue Xanax 0.5 mg 3 times a day Initiate propranolol ER 60 mg daily Reviewed the risks and benefits of benzodiazepines and beta-blocker with the patient and family Encourage the patient to follow up with her neurologist to discuss additional treatment modalities for Parkinson's disease Consider Emsam patch versus [place] for any flare in depression symptoms  I spent 30 minutes with the patient in direct face-to-face clinical care.  Greater than 50% of this time was spent in counseling and coordination of care with the patient.    Aundra Dubin, MD 07/11/2017, 11:11 AM

## 2017-07-11 NOTE — Telephone Encounter (Signed)
Patient called and would like to know if the Propanolol and the Xanax can be taken at the same time as her Synthroid, or does she need to space them out. Please review and advise, thank you

## 2017-07-11 NOTE — Telephone Encounter (Signed)
Okay, I called patient back and let her know.

## 2017-07-12 LAB — URINE CULTURE
MICRO NUMBER:: 81429681
Result:: NO GROWTH
SPECIMEN QUALITY:: ADEQUATE

## 2017-07-26 ENCOUNTER — Encounter: Payer: Self-pay | Admitting: Family Medicine

## 2017-07-26 ENCOUNTER — Ambulatory Visit (INDEPENDENT_AMBULATORY_CARE_PROVIDER_SITE_OTHER): Payer: Medicare Other | Admitting: Family Medicine

## 2017-07-26 VITALS — BP 134/80 | HR 72 | Temp 97.5°F | Ht 59.0 in | Wt 108.0 lb

## 2017-07-26 DIAGNOSIS — N390 Urinary tract infection, site not specified: Secondary | ICD-10-CM | POA: Diagnosis not present

## 2017-07-26 DIAGNOSIS — R109 Unspecified abdominal pain: Secondary | ICD-10-CM | POA: Diagnosis not present

## 2017-07-26 DIAGNOSIS — R251 Tremor, unspecified: Secondary | ICD-10-CM | POA: Diagnosis not present

## 2017-07-26 DIAGNOSIS — F332 Major depressive disorder, recurrent severe without psychotic features: Secondary | ICD-10-CM | POA: Diagnosis not present

## 2017-07-26 LAB — POCT URINALYSIS DIP (PROADVANTAGE DEVICE)
BILIRUBIN UA: NEGATIVE mg/dL
Bilirubin, UA: NEGATIVE
Blood, UA: NEGATIVE
GLUCOSE UA: NEGATIVE mg/dL
Leukocytes, UA: NEGATIVE
Nitrite, UA: NEGATIVE
PROTEIN UA: NEGATIVE mg/dL
SPECIFIC GRAVITY, URINE: 1.015
Urobilinogen, Ur: NEGATIVE
pH, UA: 6 (ref 5.0–8.0)

## 2017-07-26 NOTE — Progress Notes (Signed)
Chief Complaint  Patient presents with  . Advice Only    feels like her bladder is full, her chiro told her to have her urine checked.    She was diagnosed with UTI on 12/5. She was originally treated with macrobid. She was later changed to cipro, when culture came back proteus mirabilis, resistant to nitrofurantoin.  She reports taking cipro for 5 days (BID).  She was fine while she was taking it, but developed diarrhea x 3 days, starting about 3 days after completing the course.  She also reports that by the 3rd day on the cipro she had some burning in her feet, they looked red and hurt (from the knee down), lasted for a couple of days, resolved when she finished the antibiotics.  She presents for recheck today because her bladder felt "swollen". She was at the chiropractor today (for sciatica issue), and mentioned this, and he thinks the back could contribute, but recommended she get her bladder rechecked.  She denies dysuria, urgency, frequency, hematuria. She denies any urinary leakage, overflow or any incontinence.  She had chocolate flavored instant breakfast with lactose-free milk, and developed diarrhea today.  She has had this in the past from drinking chocolate drinks. She had some fecal incontinence on the way to this visit.  PMH, PSH, SH reviewed  Outpatient Encounter Medications as of 07/26/2017  Medication Sig Note  . ALPRAZolam (XANAX) 0.5 MG tablet Take 1 tablet (0.5 mg total) by mouth 3 (three) times daily.   Marland Kitchen b complex vitamins capsule Take 1 capsule by mouth as needed.  10/26/2016: daily  . Cholecalciferol (VITAMIN D) 2000 UNITS tablet Take 2,000 Units by mouth daily. 05/04/2015: Taking daily  . Probiotic Product (ALIGN PO) Take 1 capsule by mouth every other day.    . propranolol ER (INDERAL LA) 60 MG 24 hr capsule Take 1 capsule (60 mg total) by mouth daily.   Marland Kitchen SYNTHROID 50 MCG tablet Take 50 mg by mouth every morning.   . vitamin E 400 UNIT capsule Take 400 Units by mouth  every Monday, Wednesday, and Friday. Reported on 09/06/2015   . acetaminophen (TYLENOL) 650 MG CR tablet Take 650 mg by mouth as needed for pain. Reported on 09/06/2015   . [DISCONTINUED] ciprofloxacin (CIPRO) 250 MG tablet Take 1 tablet (250 mg total) by mouth 2 (two) times daily. (Patient not taking: Reported on 07/26/2017)    No facility-administered encounter medications on file as of 07/26/2017.    Allergies  Allergen Reactions  . Iodine Anaphylaxis    IV and topical forms.  Burnard Leigh [Hyoscyamine Sulfate]     Vision problems/pt has glaucoma  . Salmon [Fish Allergy] Hives and Shortness Of Breath  . Shellfish Allergy Anaphylaxis  . Remeron [Mirtazapine] Other (See Comments)    Cause blurred vision and red eyes, pt has glaucoma  . Aspirin Other (See Comments)    Sever stomach pain due to ulcer scaring.  . Ciprofloxacin Diarrhea  . Codeine Nausea And Vomiting  . Darvocet [Propoxyphene N-Acetaminophen] Nausea And Vomiting  . Demerol [Meperidine] Nausea Only  . Dexilant [Dexlansoprazole] Swelling    Redness, swelling and peeling of both feet.  . Diphedryl [Diphenhydramine] Other (See Comments)    Increased pulse/small amount ok  . Doxycycline Hyclate Other (See Comments)    GI intolerance.  Marland Kitchen Epinephrine Other (See Comments)    Breathing problems  . Erythromycin Other (See Comments)    GI intolerance.  Yvette Rack [Cyclobenzaprine] Other (See Comments)  Tingly/prickly sensation.  Marland Kitchen Keflex [Cephalexin] Hives  . Latex Other (See Comments)    Gloves ok.  Skin gets red from elastic in underwear and latex bandaides.  . Prednisone Other (See Comments)    Headache  . Pylera [Bis Subcit-Metronid-Tetracyc] Swelling    Tongue swelling. Face tingling  . Sulfa Antibiotics Other (See Comments)    Increased pulse, fainting, diarrhea, thrush  . Xylocaine [Lidocaine Hcl]     With epinephrine, given by dentist.  Speeded up heart rate and she passed out (occured twice, at dentist)  . Zoloft  [Sertraline Hcl] Swelling and Other (See Comments)    Migraine Swelling of tongue/lip (09/2012)  . Advil [Ibuprofen] Other (See Comments)    Motrin ok with a GI effect.  . Clarithromycin Rash    Started after completing 10 day course of 2000 mg /day   ROS: no fever, chills, headache, dizziness, urinary complaints, vomiting.  Had diarrhea today, but otherwise bowels are usually fine.  Pain in feet from cipro resolved upon completing the course. Denies bleeding, bruising.  No URI symptoms, chest pain, cough, shortness of breath.  She has some chronic fatigue, pain, currently having sciatica issues.   PHYSICAL EXAM:  BP 134/80   Pulse 72   Temp (!) 97.5 F (36.4 C) (Tympanic)   Ht 4\' 11"  (1.499 m)   Wt 108 lb (49 kg)   BMI 21.81 kg/m   Pleasant, thin, elderly female in no distress.  She was crying with nurse (embarrassed about accident), but was in good spirits during our visit.  While she complains of pain, fears related to her diagnosis of Parksinson's, she is laughing, talking about her upcoming 67th wedding anniversary, how her husband makes her laugh, which helps her moods. HEENT: conjunctiva and sclera are clear, EOMI, OP clear, moist mucus membranes Neck: no lymphadenopathy or mass Heart: regular rate and rhythm, no murmur Lungs: clear bilaterally Back: no spinal tenderness, no CVA tenderness. Tender at R>L SI joints Abdomen: soft, normal bowel sounds, nontender Extremities: no edema Neuro: alert and oriented. Cranial nerves intact.  Resting tremor RUE. Normal gait Skin: normal turgor, no rash  Urine dip was normal.   ASSESSMENT/PLAN:  Abdominal discomfort - possibly related to chocolate drink, which caused diarrhea. Reassured no e/o UTI  Frequent UTI - Plan: POCT Urinalysis DIP (Proadvantage Device)  Tremor - mostly R hand, resting. Dx'd with PD--reassured, counseled re: using LUE  more, typing when able, instead of writing, to be less frustrated re: tremor  Severe  recurrent major depression without psychotic features ([place]) - Seems a little better today--frustrated with chronic pain, fear of PD; counseled re: keeping things in perspective, calming anxiety/fear, use of laughter

## 2017-08-06 ENCOUNTER — Encounter (HOSPITAL_COMMUNITY): Payer: Self-pay | Admitting: Psychiatry

## 2017-08-07 ENCOUNTER — Encounter (HOSPITAL_COMMUNITY): Payer: Self-pay | Admitting: Psychiatry

## 2017-08-07 DIAGNOSIS — M5417 Radiculopathy, lumbosacral region: Secondary | ICD-10-CM | POA: Diagnosis not present

## 2017-08-07 DIAGNOSIS — M7061 Trochanteric bursitis, right hip: Secondary | ICD-10-CM | POA: Diagnosis not present

## 2017-08-07 DIAGNOSIS — M7062 Trochanteric bursitis, left hip: Secondary | ICD-10-CM | POA: Diagnosis not present

## 2017-08-09 DIAGNOSIS — I8311 Varicose veins of right lower extremity with inflammation: Secondary | ICD-10-CM | POA: Diagnosis not present

## 2017-08-09 DIAGNOSIS — I8312 Varicose veins of left lower extremity with inflammation: Secondary | ICD-10-CM | POA: Diagnosis not present

## 2017-08-09 DIAGNOSIS — M79604 Pain in right leg: Secondary | ICD-10-CM | POA: Diagnosis not present

## 2017-08-10 DIAGNOSIS — F331 Major depressive disorder, recurrent, moderate: Secondary | ICD-10-CM | POA: Diagnosis not present

## 2017-08-15 ENCOUNTER — Encounter: Payer: Self-pay | Admitting: Family Medicine

## 2017-08-15 ENCOUNTER — Ambulatory Visit (INDEPENDENT_AMBULATORY_CARE_PROVIDER_SITE_OTHER): Payer: Medicare Other | Admitting: Family Medicine

## 2017-08-15 VITALS — BP 118/60 | HR 68 | Temp 98.2°F | Ht 59.0 in | Wt 104.8 lb

## 2017-08-15 DIAGNOSIS — F419 Anxiety disorder, unspecified: Secondary | ICD-10-CM

## 2017-08-15 DIAGNOSIS — E039 Hypothyroidism, unspecified: Secondary | ICD-10-CM

## 2017-08-15 DIAGNOSIS — Z72821 Inadequate sleep hygiene: Secondary | ICD-10-CM | POA: Diagnosis not present

## 2017-08-15 NOTE — Patient Instructions (Addendum)
Try and be as least disruptive as possible when you wake up--leave the lights off, try to stay in bed (unless you need to use the bathroom).  Don't check your blood sugar--they are fine.  Work on breathing/relaxation techniques as we discussed, to try and relax you and help you get back to sleep.  You can ask your therapist to help you with this, if needed.  Consider not taking the alprazolam at bedtime (if you're able to fall asleep without it), and use it just if needed when you wake up.  We are checking your thyroid today to make sure that your current dose is still correct.  Insomnia Insomnia is a sleep disorder that makes it difficult to fall asleep or to stay asleep. Insomnia can cause tiredness (fatigue), low energy, difficulty concentrating, mood swings, and poor performance at work or school. There are three different ways to classify insomnia:  Difficulty falling asleep.  Difficulty staying asleep.  Waking up too early in the morning.  Any type of insomnia can be long-term (chronic) or short-term (acute). Both are common. Short-term insomnia usually lasts for three months or less. Chronic insomnia occurs at least three times a week for longer than three months. What are the causes? Insomnia may be caused by another condition, situation, or substance, such as:  Anxiety.  Certain medicines.  Gastroesophageal reflux disease (GERD) or other gastrointestinal conditions.  Asthma or other breathing conditions.  Restless legs syndrome, sleep apnea, or other sleep disorders.  Chronic pain.  Menopause. This may include hot flashes.  Stroke.  Abuse of alcohol, tobacco, or illegal drugs.  Depression.  Caffeine.  Neurological disorders, such as Alzheimer disease.  An overactive thyroid (hyperthyroidism).  The cause of insomnia may not be known. What increases the risk? Risk factors for insomnia include:  Gender. Women are more commonly affected than men.  Age. Insomnia  is more common as you get older.  Stress. This may involve your professional or personal life.  Income. Insomnia is more common in people with lower income.  Lack of exercise.  Irregular work schedule or night shifts.  Traveling between different time zones.  What are the signs or symptoms? If you have insomnia, trouble falling asleep or trouble staying asleep is the main symptom. This may lead to other symptoms, such as:  Feeling fatigued.  Feeling nervous about going to sleep.  Not feeling rested in the morning.  Having trouble concentrating.  Feeling irritable, anxious, or depressed.  How is this treated? Treatment for insomnia depends on the cause. If your insomnia is caused by an underlying condition, treatment will focus on addressing the condition. Treatment may also include:  Medicines to help you sleep.  Counseling or therapy.  Lifestyle adjustments.  Follow these instructions at home:  Take medicines only as directed by your health care provider.  Keep regular sleeping and waking hours. Avoid naps.  Keep a sleep diary to help you and your health care provider figure out what could be causing your insomnia. Include: ? When you sleep. ? When you wake up during the night. ? How well you sleep. ? How rested you feel the next day. ? Any side effects of medicines you are taking. ? What you eat and drink.  Make your bedroom a comfortable place where it is easy to fall asleep: ? Put up shades or special blackout curtains to block light from outside. ? Use a white noise machine to block noise. ? Keep the temperature cool.  Exercise  regularly as directed by your health care provider. Avoid exercising right before bedtime.  Use relaxation techniques to manage stress. Ask your health care provider to suggest some techniques that may work well for you. These may include: ? Breathing exercises. ? Routines to release muscle tension. ? Visualizing peaceful  scenes.  Cut back on alcohol, caffeinated beverages, and cigarettes, especially close to bedtime. These can disrupt your sleep.  Do not overeat or eat spicy foods right before bedtime. This can lead to digestive discomfort that can make it hard for you to sleep.  Limit screen use before bedtime. This includes: ? Watching TV. ? Using your smartphone, tablet, and computer.  Stick to a routine. This can help you fall asleep faster. Try to do a quiet activity, brush your teeth, and go to bed at the same time each night.  Get out of bed if you are still awake after 15 minutes of trying to sleep. Keep the lights down, but try reading or doing a quiet activity. When you feel sleepy, go back to bed.  Make sure that you drive carefully. Avoid driving if you feel very sleepy.  Keep all follow-up appointments as directed by your health care provider. This is important. Contact a health care provider if:  You are tired throughout the day or have trouble in your daily routine due to sleepiness.  You continue to have sleep problems or your sleep problems get worse. Get help right away if:  You have serious thoughts about hurting yourself or someone else. This information is not intended to replace advice given to you by your health care provider. Make sure you discuss any questions you have with your health care provider. Document Released: 07/07/2000 Document Revised: 12/10/2015 Document Reviewed: 04/10/2014 Elsevier Interactive Patient Education  [employer].

## 2017-08-15 NOTE — Progress Notes (Signed)
Chief Complaint  Patient presents with  . Advice Only    waking up for the last 10 nights around 1am, cold and clammy. Head is sweaty. Feels like a flutter in her stomach. Her counselor tell her that these are called "nightime panic attacks." Has been checking blood sugar when this occurs and blood pressure. These are fine. Tired during the day because she is being woken up. Husband reports she is not eating well.   . Back Pain    feels tight across her upper back.   Waking up at 2am feeling cold, clammy, not sweaty.  Blood sugar is 79-80's (has been checking every time she wakes up clammy). Heart is racing, and gradually improves. Mild nausea just a couple of times (she attributes to her neck pain). Once she wakes up and moves around, it dissipates.  She has trouble getting back to sleep, takes 3-4 hours, sometimes can't get back to bed at all. Fell asleep right away at 11:30 last night.  Has been happening most nights for the last almost 2 weeks.  No change in activities or habits.  Doesn't recall any dreams, denies having nightmares.  She reports she "has some hallucinations" since ECT, last 2.5 weeks ago--she describes "seeing" scary people/events, occurring when sleeping (but doesn't call them dreams?)  +hair loss in the last few months. Dry skin Some naps--once slept 4.5 hours and still slept great the following night.  She reports she got injections in both hip bursa, got good relief, walks without pain now. Still complains of feeling heavy from knees down, "like lead".  PMH, PSH, SH reviewed Meds and allergies reviewed  Outpatient Encounter Medications as of 08/15/2017  Medication Sig Note  . ALPRAZolam (XANAX) 0.5 MG tablet Take 1 tablet (0.5 mg total) by mouth 3 (three) times daily. 08/15/2017: Takes morning, afternoon and before bed  . SYNTHROID 50 MCG tablet Take 50 mg by mouth every morning.   Marland Kitchen acetaminophen (TYLENOL) 650 MG CR tablet Take 650 mg by mouth as needed for pain.  Reported on 09/06/2015   . b complex vitamins capsule Take 1 capsule by mouth as needed.  10/26/2016: daily  . Cholecalciferol (VITAMIN D) 2000 UNITS tablet Take 2,000 Units by mouth daily. 05/04/2015: Taking daily  . Probiotic Product (ALIGN PO) Take 1 capsule by mouth every other day.    . vitamin E 400 UNIT capsule Take 400 Units by mouth every Monday, Wednesday, and Friday. Reported on 09/06/2015   . [DISCONTINUED] propranolol ER (INDERAL LA) 60 MG 24 hr capsule Take 1 capsule (60 mg total) by mouth daily. (Patient not taking: Reported on 08/15/2017)    No facility-administered encounter medications on file as of 08/15/2017.    ROS: no fever, chills, URI symptoms, headaches, dizziness, chest pain.  +heart racing, cold/clammy spells in the middle of the night per HPI.  Chronic fatigue, insomnia per HPI.  No other new complaints.  Remains depressed.  PHYSICAL EXAM:  BP 118/60   Pulse 68   Temp 98.2 F (36.8 C) (Tympanic)   Ht 4\' 11"  (1.499 m)   Wt 104 lb 12.8 oz (47.5 kg)   BMI 21.17 kg/m  Wt Readings from Last 3 Encounters:  08/15/17 104 lb 12.8 oz (47.5 kg)  07/26/17 108 lb (49 kg)  06/27/17 106 lb 9.6 oz (48.4 kg)   Well appearing, elderly female, wearing a knit hat, accompanied by her husband.  Both appear to be in good spirits in normal conversation HEENT: conjunctiva and sclera  are clear, EOMI Neck: no lymphadenopathy, thyromegaly or mass Heart: regular rate and rhythm Lungs: clear bilaterally Extremities: no edema Psych: depressed, slightly anxious.  Full range of affect. Normal speech, eye contact, hygiene and grooming Neuro: alert and oriented, normal cranial nerves, gait  ASSESSMENT/PLAN:  Anxiety - suspect episodes may be related to dreams, anxiety. Reassured. Instructed on relaxation techniques  Poor sleep hygiene - and insomnia.  Counseled.   Hypothyroidism, unspecified type - recheck TSH given hair loss, dry skin, possible heat intolerance - Plan: TSH   Sleep  hygiene reviewed. Shown some breathing techniques.  Discussed DDx for symptoms, as well as how to handle, and how to try and get back to sleep. Check TSH to r/o overreplacement contributing to her sx (doubt).  25-30 min visit, more than 1/2 spent counseling.

## 2017-08-16 ENCOUNTER — Encounter: Payer: Self-pay | Admitting: Family Medicine

## 2017-08-16 LAB — TSH: TSH: 0.625 u[IU]/mL (ref 0.450–4.500)

## 2017-08-17 ENCOUNTER — Encounter (HOSPITAL_COMMUNITY): Payer: Self-pay | Admitting: Psychiatry

## 2017-08-20 ENCOUNTER — Encounter: Payer: Self-pay | Admitting: Family Medicine

## 2017-08-22 ENCOUNTER — Ambulatory Visit: Payer: Self-pay | Admitting: Family Medicine

## 2017-08-22 ENCOUNTER — Encounter: Payer: Self-pay | Admitting: Family Medicine

## 2017-08-22 ENCOUNTER — Ambulatory Visit (INDEPENDENT_AMBULATORY_CARE_PROVIDER_SITE_OTHER): Payer: Medicare Other | Admitting: Family Medicine

## 2017-08-22 VITALS — BP 122/80 | HR 80 | Ht 59.0 in | Wt 105.4 lb

## 2017-08-22 DIAGNOSIS — E039 Hypothyroidism, unspecified: Secondary | ICD-10-CM

## 2017-08-22 DIAGNOSIS — F419 Anxiety disorder, unspecified: Secondary | ICD-10-CM | POA: Diagnosis not present

## 2017-08-22 DIAGNOSIS — F332 Major depressive disorder, recurrent severe without psychotic features: Secondary | ICD-10-CM

## 2017-08-22 DIAGNOSIS — R5383 Other fatigue: Secondary | ICD-10-CM | POA: Diagnosis not present

## 2017-08-22 DIAGNOSIS — R002 Palpitations: Secondary | ICD-10-CM

## 2017-08-22 NOTE — Patient Instructions (Signed)
  We are checking EKG and labs to make sure there doesn't seem to be anything underlying that is contributing to your symptoms. It does sound as though there is anxiety (in the larger stores) that may have caused the episode at [facility] on Monday. I would continue to work with your counselor on helping treat your anxiety and depression, including the crying spells. Continue to exercise on the bike (since you don't feel badly at all doing so), gradually working up to 30 minutes/day, increasing very slowly

## 2017-08-22 NOTE — Progress Notes (Signed)
Chief Complaint  Patient presents with  . Tachycardia    was in [employer] this past Monday. Was pushing cart and felt like her heart was racing and she felt "weird." Was very extremely fatigued and went home and went to bed. On another note, for a couple weeks she has a period of time each day where she is very emotional, cries for a while (maybe 30-60 minutes). And then gradually stops and has a pain/weakness under her breath away. Questioning panic attack?   2 days ago, while pushing an empty cart at [employer], she felt her heart start racing, then legs got rubbery, weak.  She didn't sit and rest, she "just kept going" and it got better.  She hurried, but finished her shopping Heart rate gradually increased, not suddenly, and gradually improved. She had pain in her upper back when it was beating fast. No associated lightheadedness or dizziness. No associated chest pain, but she did have a discomfort across her whole upper abdomen, "like a weakness".  No nausea.  She had eaten that morning, nothing different.  She drinks a lot of water, doesn't drink caffeine Take takes xanax three times daily. She had taken it about 30 minutes prior to going to [employer].  Sometimes in the grocery store she also feels the same, not as bad as when she is in a "big store".  Admits to hating being in those larger stores.  She also gets very tired/fatigue when washing back and forth when she does the laundry. She also gets some back discomfort (husband helps her empty the washer/dryer).    She started riding her exercise bike again a few days ago.  Started slowly, 9 mins, and did 10 minutes this morning.  It seems to help the pain in her legs.  No associated tachycardia, back or stomach weakness/discomfort. She pushed herself a little harder today, 1.5 miles in 10 minutes.  Her other complaint today is episodes of being very emotional, crying.  Occurs around 10:30, comes on in the midst of eating breakfast, over the last  1-2 weeks.  She thinks this happens after she takes the xanax, but this only happens in the morning, not after the other doses later in the day. Usually starts when she is eating breakfast.  "it just comes on", lasts about 30-45 minutes. Most of the time she pushes through, occasionally will get back in bed.  Takes synthroid 8:30-8:45am, xanax 10-10:30, 3-3:30 and before bed (around 11). She is not currently taking any other prescription medications.  Previously had issues with PVC's, but denies noticing that recently.  PMH, PSH, SH reviewed  Outpatient Encounter Medications as of 08/22/2017  Medication Sig Note  . ALPRAZolam (XANAX) 0.5 MG tablet Take 1 tablet (0.5 mg total) by mouth 3 (three) times daily. 08/15/2017: Takes morning, afternoon and before bed  . b complex vitamins capsule Take 1 capsule by mouth as needed.  10/26/2016: daily  . Cholecalciferol (VITAMIN D) 2000 UNITS tablet Take 2,000 Units by mouth daily. 05/04/2015: Taking daily  . Probiotic Product (ALIGN PO) Take 1 capsule by mouth every other day.    Marland Kitchen SYNTHROID 50 MCG tablet Take 50 mg by mouth every morning.   . vitamin E 400 UNIT capsule Take 400 Units by mouth every Monday, Wednesday, and Friday. Reported on 09/06/2015   . acetaminophen (TYLENOL) 650 MG CR tablet Take 650 mg by mouth as needed for pain. Reported on 09/06/2015    No facility-administered encounter medications on file as of  08/22/2017.    See list of allergies, reviewed  ROS: no fever, has been feeling cold, tired per HPI.  Denies URI symptoms, chest pain (just the upper abdominal "weakness" and back pain as reported in HPI).  No vomiting, diarrhea, bleeding, bruising, rashes, urinary complaints. +depression  PHYSICAL EXAM:  BP 122/80   Pulse 80   Ht _0  (1.499 m)   Wt 105 lb 6.4 oz (47.8 kg)   BMI 21.29 kg/m   Wt Readings from Last 3 Encounters:  08/22/17 105 lb 6.4 oz (47.8 kg)  08/15/17 104 lb 12.8 oz (47.5 kg)  07/26/17 108 lb (49 kg)    Somewhat frail-appearing, elderly female, tearful and anxious, but also smiling. In no distress, just frustrated and concerned HEENT: conjunctiva and sclera are clear, OP clear Neck: no lymphadenopathy, thyromegaly or carotid bruit Heart: regular rate and rhythm, no murmur or ectopy Lungs: clear bilaterally Back: no spinal or CVA tenderness Chest: nontender Abdomen: soft, nontender, no mass Extremities: no edema, clubbing, cyanosis Skin: no rash, normal turgor Psych: depressed and anxious, with full range of affect. Normal speech, eye contact, hygiene and grooming Neuro: alert and oriented, normal cranial nerves and gait. Resting tremor in hands unchanged  EKG: NSR, rate 62. Incomplete RBBB.  Comparison to 01/2017 notes some changes, mostly related to RWP--there is incomplete transition/poor RWP now.  ASSESSMENT/PLAN:  Palpitations - Plan: CBC with Differential/Platelet, Comprehensive metabolic panel, T4, Free, EKG 12-Lead  Fatigue, unspecified type - Plan: CBC with Differential/Platelet, Comprehensive metabolic panel, T4, Free  Hypothyroidism, unspecified type - Plan: T4, Free  Severe recurrent major depression without psychotic features ([facility])  Anxiety   CBC, c-met, free T4   Consider sending back to cardiology if labs normal and ongoing problems. Consider CXR if persistent back pain with exertion  I suspect it is related to anxiety (occurring in a large store, which she "hates"), NOT having problems when using exercise bike.  Back pain only when pushing cart or doing laundry, may be MSK and related to her fibromyalgia.   We are checking EKG and labs to make sure there doesn't seem to be anything underlying that is contributing to your symptoms. It does sound as though there is anxiety (in the larger stores) that may have caused the episode at [facility] on Monday. I would continue to work with your counselor on helping treat your anxiety and depression, including the crying  spells. Continue to exercise on the bike (since you don't feel badly at all doing so), gradually working up to 30 minutes/day, increasing very slowly

## 2017-08-23 ENCOUNTER — Other Ambulatory Visit: Payer: Self-pay | Admitting: *Deleted

## 2017-08-23 ENCOUNTER — Ambulatory Visit
Admission: RE | Admit: 2017-08-23 | Discharge: 2017-08-23 | Disposition: A | Discharge status: Home / Self Care | Payer: Medicare Other | Source: Ambulatory Visit | Attending: Family Medicine | Admitting: Family Medicine

## 2017-08-23 ENCOUNTER — Encounter: Payer: Self-pay | Admitting: Family Medicine

## 2017-08-23 DIAGNOSIS — M549 Dorsalgia, unspecified: Secondary | ICD-10-CM

## 2017-08-23 DIAGNOSIS — R0789 Other chest pain: Secondary | ICD-10-CM

## 2017-08-23 DIAGNOSIS — R079 Chest pain, unspecified: Secondary | ICD-10-CM | POA: Diagnosis not present

## 2017-08-23 LAB — CBC WITH DIFFERENTIAL/PLATELET
Basophils Absolute: 0.1 10*3/uL (ref 0.0–0.2)
Basos: 0 %
EOS (ABSOLUTE): 0.1 10*3/uL (ref 0.0–0.4)
EOS: 1 %
Hematocrit: 42.7 % (ref 34.0–46.6)
Hemoglobin: 14.2 g/dL (ref 11.1–15.9)
IMMATURE GRANULOCYTES: 0 %
Immature Grans (Abs): 0 10*3/uL (ref 0.0–0.1)
LYMPHS: 18 %
Lymphocytes Absolute: 2.1 10*3/uL (ref 0.7–3.1)
MCH: 29.2 pg (ref 26.6–33.0)
MCHC: 33.3 g/dL (ref 31.5–35.7)
MCV: 88 fL (ref 79–97)
Monocytes Absolute: 1 10*3/uL — ABNORMAL HIGH (ref 0.1–0.9)
Monocytes: 8 %
NEUTROS PCT: 73 %
Neutrophils Absolute: 8.3 10*3/uL — ABNORMAL HIGH (ref 1.4–7.0)
PLATELETS: 282 10*3/uL (ref 150–379)
RBC: 4.86 x10E6/uL (ref 3.77–5.28)
RDW: 14 % (ref 12.3–15.4)
WBC: 11.6 10*3/uL — AB (ref 3.4–10.8)

## 2017-08-23 LAB — COMPREHENSIVE METABOLIC PANEL
ALT: 13 IU/L (ref 0–32)
AST: 21 IU/L (ref 0–40)
Albumin/Globulin Ratio: 1.4 (ref 1.2–2.2)
Albumin: 4 g/dL (ref 3.5–4.7)
Alkaline Phosphatase: 75 IU/L (ref 39–117)
BUN/Creatinine Ratio: 23 (ref 12–28)
BUN: 16 mg/dL (ref 8–27)
Bilirubin Total: 1 mg/dL (ref 0.0–1.2)
CALCIUM: 9.4 mg/dL (ref 8.7–10.3)
CO2: 26 mmol/L (ref 20–29)
CREATININE: 0.7 mg/dL (ref 0.57–1.00)
Chloride: 101 mmol/L (ref 96–106)
GFR calc Af Amer: 92 mL/min/{1.73_m2} (ref >59)
GFR, EST NON AFRICAN AMERICAN: 80 mL/min/{1.73_m2} (ref >59)
GLUCOSE: 94 mg/dL (ref 65–99)
Globulin, Total: 2.8 g/dL (ref 1.5–4.5)
POTASSIUM: 4.4 mmol/L (ref 3.5–5.2)
Sodium: 144 mmol/L (ref 134–144)
Total Protein: 6.8 g/dL (ref 6.0–8.5)

## 2017-08-23 LAB — T4, FREE: Free T4: 1.58 ng/dL (ref 0.82–1.77)

## 2017-08-24 DIAGNOSIS — I8311 Varicose veins of right lower extremity with inflammation: Secondary | ICD-10-CM | POA: Diagnosis not present

## 2017-08-24 DIAGNOSIS — I8312 Varicose veins of left lower extremity with inflammation: Secondary | ICD-10-CM | POA: Diagnosis not present

## 2017-08-26 NOTE — Progress Notes (Signed)
Chief Complaint  Patient presents with  . Medicare Wellness    nonfasting AWV, no pap. Right ear has been popping x 1 week. Still very fatigued and has that racing feeling in her chest. And after she eats she has a fullnes in her stomach.     Tracy Malone is a 84 y.o. female who presents for annual wellness visit and follow-up on chronic medical conditions.  She has the following concerns:  Seen last week with palpitations, tachycardia, back pain with pushing cart (but not with exercise bike).  No significant changes since last visit.  She is cutting her Synthroid in half, had one episode where heart still seemed faster.  At her prior visit, she also described crying/emotional for no reason.   Patient's prior notes/records reviewed, including neuro notes reviewed, from Tracy Malone 03/2017. At that visit they discussed possible pseudobulbar affect, in addition to depression. They had Nuedexta, said to consider it at some point, and to discuss with her psychiatrist as well.  She has f/u with her 3/1 (also has f/u with Tracy Malone in August--shouldn't have both--discussed this).  Hypothyroidism:  It was suggested she cut her thyroid dose from 50 to 25 after her last labs (TSH trending down, free T4 higher than it needs to be), possibly somewhat symptomatic, so trial of lower dose. She has dry skin, hair loss, depression, palpitations/tachycardia as per last visit. She started cutting them in half.  No known change yet in any symptoms. Persistent significant fatigue.  Mild carotid artery disease--intolerant of a statin in the past, and had been unwilling to try additional statins.  Recheck of u/s in 2013 was stable, minimal disease. Denies neuro symptoms--no headaches, dizziness, numbness, tingling, changes in speech, or other concerns. Some memory loss related to ECT, and tremor/PD   Immunization History  Administered Date(s) Administered  . Influenza Split 06/22/2015  . Influenza, High Dose Seasonal  PF 06/13/2013, 04/28/2014, 05/12/2017  . Influenza-Unspecified 05/17/2016  . Pneumococcal Conjugate-13 09/29/2013  . Tdap 09/23/2009   Last Pap smear: last year with Tracy Malone Last mammogram: 05/2017 Last colonoscopy: 2006; negative Cologard 04/2015 Last DEXA: done through Tracy Malone. Dossie Malone (known osteoporosis) Carotid u/s 07/2011 (no significant stenosis, stable/unchanged, 20-30% at [facility]) Dentist: twice yearly Ophtho: yearly Exercise: exercise bike 9-10 minutes intermittently/sporadically, when not too tired.  Other doctors caring for this patient include:  GYN:  Tracy Malone Rheum:  Tracy Malone (previously Tracy Malone) Ophtho: Tracy Malone Dentist:  Tracy Malone GI: Tracy Malone Cardiology:  Tracy Malone Derm: Tracy Malone Urologist: Tracy Malone (doesn't plan to see him again) Psychiatrist: Dr. Sharyon Malone (previously saw Tracy Malone for ECT in the past Ortho: Tracy Malone Neuro: Tracy Malone and also Tracy Malone (for tremor, poss Parkinson's) Veins: Tracy Malone  End of Life: She has a healthcare power of attorney and living will.  Depression screening: abnormal (PHQ-2 score of 4), under care of psychiatrist and therapist. Fall screen: negative Functional Status survey: some trouble walking (from fatigue, bursitis), not driving since the tremor. See epic for full screens.  Past Medical History:  Diagnosis Date  . Bell's palsy 1966   ? right side facial droop  . Carotid artery disease ([place]) 2010   on vascular screening;unchanged 2013.(could not tolerate simvastatin, no other statins tried)--<30% blockage bilat 07/2011  . Chronic abdominal pain   . Chronic fatigue and malaise   . Claustrophobia   . Depression    treated in the past for years;stopped in 2010 for a  years  . Duodenal ulcer 1962   h/o  . Dysrhythmia    ocassional PVC's  . Fibromyalgia   . Frequent PVCs 07/2012   Seen by [facility]: benign, asymptomatic, normal EF  . GERD (gastroesophageal reflux disease)    . Glaucoma, narrow-angle    s/p laser surgery  . History of hiatal hernia    during endoscopy  . Hypothyroid 9/08  . IBS (irritable bowel syndrome)    Tracy Malone  . Ocular migraine   . Osteoporosis 10/11   TracyHawkes  . Panic attack   . Recurrent UTI    has cystocele-[name]  . Shingles 1999   h/o  . Superficial thrombophlebitis 03/2009   [facility]  . Trochanteric bursitis 12/2008   bilateral    Past Surgical History:  Procedure Laterality Date  . ABDOMINAL HYSTERECTOMY    . CATARACT EXTRACTION, BILATERAL  1995, 1996  . Flexible sigmoidoscopy    . THYROIDECTOMY, PARTIAL  09/2005   L nodule; Tracy Malone  . TONSILLECTOMY  1946  . UPPER GI ENDOSCOPY  06/27/12  . VAGINAL HYSTERECTOMY  1971   and bladder repair.  Still has ovaries    Social History   Socioeconomic History  . Marital status: Married    Spouse name: Not on file  . Number of children: 2  . Years of education: Not on file  . Highest education level: Not on file  Social Needs  . Financial resource strain: Not on file  . Food insecurity - worry: Not on file  . Food insecurity - inability: Not on file  . Transportation needs - medical: Not on file  . Transportation needs - non-medical: Not on file  Occupational History  . Occupation: retired (school system)  Tobacco Use  . Smoking status: Never Smoker  . Smokeless tobacco: Never Used  Substance and Sexual Activity  . Alcohol use: No    Alcohol/week: 0.0 oz  . Drug use: No  . Sexual activity: No  Other Topics Concern  . Not on file  Social History Narrative   Married.  Son lives in [place]; Daughter Tracy Malone lives in [place]; 2 grandchildren    Family History  Problem Relation Age of Onset  . Heart disease Mother   . Hypertension Mother   . Hypertension Sister   . HIV Son   . Heart disease Brother   . Lung cancer Brother        lung  . Diabetes Maternal Grandfather     Outpatient Encounter Medications as of 08/27/2017  Medication Sig Note  .  ALPRAZolam (XANAX) 0.5 MG tablet Take 1 tablet (0.5 mg total) by mouth 3 (three) times daily. 08/15/2017: Takes morning, afternoon and before bed  . b complex vitamins capsule Take 1 capsule by mouth as needed.  10/26/2016: daily  . Cholecalciferol (VITAMIN D) 2000 UNITS tablet Take 2,000 Units by mouth daily. 05/04/2015: Taking daily  . Probiotic Product (ALIGN PO) Take 1 capsule by mouth every other day.    Marland Kitchen SYNTHROID 50 MCG tablet Take 25 mg by mouth every morning.  08/27/2017: Started cutting in half since visit last week  . vitamin E 400 UNIT capsule Take 400 Units by mouth every Monday, Wednesday, and Friday. Reported on 09/06/2015   . acetaminophen (TYLENOL) 650 MG CR tablet Take 650 mg by mouth as needed for pain. Reported on 09/06/2015    No facility-administered encounter medications on file as of 08/27/2017.     Allergies  Allergen Reactions  . Iodine  Anaphylaxis    IV and topical forms.  Burnard Leigh [Hyoscyamine Sulfate]     Vision problems/pt has glaucoma  . Salmon [Fish Allergy] Hives and Shortness Of Breath  . Shellfish Allergy Anaphylaxis  . Remeron [Mirtazapine] Other (See Comments)    Cause blurred vision and red eyes, pt has glaucoma  . Aspirin Other (See Comments)    Sever stomach pain due to ulcer scaring.  . Ciprofloxacin Diarrhea  . Codeine Nausea And Vomiting  . Darvocet [Propoxyphene N-Acetaminophen] Nausea And Vomiting  . Demerol [Meperidine] Nausea Only  . Dexilant [Dexlansoprazole] Swelling    Redness, swelling and peeling of both feet.  . Diphedryl [Diphenhydramine] Other (See Comments)    Increased pulse/small amount ok  . Doxycycline Hyclate Other (See Comments)    GI intolerance.  Marland Kitchen Epinephrine Other (See Comments)    Breathing problems  . Erythromycin Other (See Comments)    GI intolerance.  Yvette Rack [Cyclobenzaprine] Other (See Comments)    Tingly/prickly sensation.  Marland Kitchen Keflex [Cephalexin] Hives  . Latex Other (See Comments)    Gloves ok.  Skin gets  red from elastic in underwear and latex bandaides.  . Prednisone Other (See Comments)    Headache  . Pylera [Bis Subcit-Metronid-Tetracyc] Swelling    Tongue swelling. Face tingling  . Sulfa Antibiotics Other (See Comments)    Increased pulse, fainting, diarrhea, thrush  . Xylocaine [Lidocaine Hcl]     With epinephrine, given by dentist.  Speeded up heart rate and she passed out (occured twice, at dentist)  . Zoloft [Sertraline Hcl] Swelling and Other (See Comments)    Migraine Swelling of tongue/lip (09/2012)  . Advil [Ibuprofen] Other (See Comments)    Motrin ok with a GI effect.  . Clarithromycin Rash    Started after completing 10 day course of 2000 mg /day    ROS:  The patient denies fever, weight changes, headaches,  vision changes, decreased hearing, sore throat, breast concerns, chest pain, dizziness, syncope, cough, swelling, nausea (only after certain foods/large meals), vomiting, diarrhea, abdominal pain, melena, hematochezia, hematuria, incontinence, dysuria, vaginal bleeding, discharge, odor or itch, genital lesions, numbness, tingling, weakness, suspicious skin lesions, abnormal bleeding, or enlarged lymph nodes. Some easy bruising. Occasional heartburn IBS--constipation, uses Miralax most nights, with good results. Hip pain from bursitis--improved after last injection Fibromyalgia--stable, chronic pain in shoulder/upper back, and slight in lower back. Depression--not doing well, didn't benefit from ECT Occasional popping in her right ear, off/on for a couple of weeks. No longer has ringing in her ears. Feels bloated after eating dinner, discomfort in upper abdomen, only when eating a large meal Tremor, R hand, worse when nervous. Saw Dr. Aleda Grana recently, had phlebitis, resolved.  Recent US. Pessary didn't stay in place, tried 2 different kinds. No recent UTI or urinary complaints.   PHYSICAL EXAM: BP 138/78   Pulse 68   Ht 4\' 11"  (1.499 m)   Wt 104 lb 12.8 oz  (47.5 kg)   BMI 21.17 kg/m   Wt Readings from Last 3 Encounters:  08/27/17 104 lb 12.8 oz (47.5 kg)  08/22/17 105 lb 6.4 oz (47.8 kg)  08/15/17 104 lb 12.8 oz (47.5 kg)    General Appearance:    Alert, cooperative, thin, somewhat frail appearing elderly female, who is very down, constantly putting herself down, and crying intermittently.  Head:    Normocephalic, without obvious abnormality, atraumatic  Eyes:    PERRL, conjunctiva/corneas clear, EOM's intact, fundi    benign  Ears:  Normal TM's and external ear canals  Nose:   Nares normal, mucosa normal, no drainage or sinus   tenderness  Throat:   Lips, mucosa, and tongue normal; teeth and gums normal  Neck:   Supple, no lymphadenopathy;  thyroid:  no enlargement/ tenderness/nodules; no carotid bruit or JVD. WHSS anterior neck  Back:    Spine nontender, no curvature, ROM normal, no CVA   tenderness. Mildly tender in upper back, trapezius and around scapula bilaterally. No spasm  Lungs:     Clear to auscultation bilaterally without wheezes, rales or   ronchi; respirations unlabored  Chest Wall:    No tenderness or deformity   Heart:    Regular rate and rhythm, S1 and S2 normal, no murmur, rub   or gallop   Breast Exam:    Deferred to GYN  Abdomen:     Soft, nondistended, normoactive bowel sounds, no masses, no hepatosplenomegaly. Mildly tender in epigastrium today (was nontender at recent visit). No rebound or guarding  Genitalia:    Deferred to GYN     Extremities:   No clubbing, cyanosis or edema  Pulses:   2+ and symmetric all extremities  Skin:   Skin color, texture, turgor normal. No rashes/lesions.   Lymph nodes:   Cervical, supraclavicular, and axillary nodes normal  Neurologic:   CNII-XII intact, normal strength, sensation and gait; reflexes 2+ and symmetric throughout. Resting right hand tremor.                              Psych:  Depressed and anxious mood, full range of affect, normal hygiene and grooming.  Normal  eye contact and speech    ASSESSMENT/PLAN:  Medicare annual wellness visit, subsequent  Severe recurrent major depression without psychotic features ([place]) - to f/u with psych and therapist.  Consider PBA, whether or not this was previously mentioned, possible cause of her new crying spells at breakfast  Anxiety  Hypothyroidism, unspecified type - has 6 week lab visit scheduled to recheck TSH on 70mcg dose.  Hiatal hernia  Popping of right ear - normal exam, reassured    Discussed monthly self breast exams and yearly mammograms; at least 30 minutes of aerobic activity at least 5 days/week, weight-bearing exercise at least 2-3x/week; proper sunscreen use reviewed; healthy diet, including goals of calcium and vitamin D intake and alcohol recommendations (less than or equal to 1 drink/day) reviewed; regular seatbelt use; changing batteries in smoke detectors.  Immunization recommendations discussed--pneumovax recommended--even if she got a dose at age 39, booster is recommended. She prefers to check her records to see when her last one was. Continue yearly high dose flu shots. Shingrix recommended, risks/side effects reviewed, to get from pharmacy.  Colonoscopy recommendations reviewed--cologard due again 04/2018 (won't be covered by her birthday in 07/2018).   Answered questions re: hiatal hernia, in discussing her GI complaints. Encouraged to eat small, frequent meals, waiting at least 2-3 hours after eating before reclining.  Info given.  MOST form reviewed, Full Code, Full Care  45 min total visit, more than 1/2 spent counseling (re: depression, vaccinations, coordinating her various doctors, answering her many questions, etc.)   F/u 3/14 as scheduled for repeat TSH    Medicare Attestation I have personally reviewed: The patient's medical and social history Their use of alcohol, tobacco or illicit drugs Their current medications and supplements The patient's functional ability  including ADLs,fall risks, home safety risks, cognitive, and hearing  and visual impairment Diet and physical activities Evidence for depression or mood disorders  The patient's weight, height and BMI have been recorded in the chart.  I have made referrals, counseling, and provided education to the patient based on review of the above and I have provided the patient with a written personalized care plan for preventive services.

## 2017-08-27 ENCOUNTER — Encounter: Payer: Self-pay | Admitting: Family Medicine

## 2017-08-27 ENCOUNTER — Ambulatory Visit (INDEPENDENT_AMBULATORY_CARE_PROVIDER_SITE_OTHER): Payer: Medicare Other | Admitting: Family Medicine

## 2017-08-27 VITALS — BP 138/78 | HR 68 | Ht 59.0 in | Wt 104.8 lb

## 2017-08-27 DIAGNOSIS — F332 Major depressive disorder, recurrent severe without psychotic features: Secondary | ICD-10-CM

## 2017-08-27 DIAGNOSIS — F419 Anxiety disorder, unspecified: Secondary | ICD-10-CM

## 2017-08-27 DIAGNOSIS — H938X1 Other specified disorders of right ear: Secondary | ICD-10-CM

## 2017-08-27 DIAGNOSIS — K449 Diaphragmatic hernia without obstruction or gangrene: Secondary | ICD-10-CM | POA: Diagnosis not present

## 2017-08-27 DIAGNOSIS — E039 Hypothyroidism, unspecified: Secondary | ICD-10-CM

## 2017-08-27 DIAGNOSIS — Z0000 Encounter for general adult medical examination without abnormal findings: Secondary | ICD-10-CM

## 2017-08-27 NOTE — Patient Instructions (Addendum)
HEALTH MAINTENANCE RECOMMENDATIONS:  It is recommended that you get at least 30 minutes of aerobic exercise at least 5 days/week (for weight loss, you may need as much as 60-90 minutes). This can be any activity that gets your heart rate up. This can be divided in 10-15 minute intervals if needed, but try and build up your endurance at least once a week.  Weight bearing exercise is also recommended twice weekly.  Eat a healthy diet with lots of vegetables, fruits and fiber.  "Colorful" foods have a lot of vitamins (ie green vegetables, tomatoes, red peppers, etc).  Limit sweet tea, regular sodas and alcoholic beverages, all of which has a lot of calories and sugar.  Up to 1 alcoholic drink daily may be beneficial for women (unless trying to lose weight, watch sugars).  Drink a lot of water.  Calcium recommendations are 1200-1500 mg daily (1500 mg for postmenopausal women or women without ovaries), and vitamin D 1000 IU daily.  This should be obtained from diet and/or supplements (vitamins), and calcium should not be taken all at once, but in divided doses.  Monthly self breast exams and yearly mammograms for women over the age of 82 is recommended.  Sunscreen of at least SPF 30 should be used on all sun-exposed parts of the skin when outside between the hours of 10 am and 4 pm (not just when at beach or pool, but even with exercise, golf, tennis, and yard work!)  Use a sunscreen that says "broad spectrum" so it covers both UVA and UVB rays, and make sure to reapply every 1-2 hours.  Remember to change the batteries in your smoke detectors when changing your clock times in the spring and fall. Carbon monoxide detectors are recommended for the home.  Use your seat belt every time you are in a car, and please drive safely and not be distracted with cell phones and texting while driving.   Ms. Gerwig , Thank you for taking time to come for your Medicare Wellness Visit. I appreciate your ongoing  commitment to your health goals. Please review the following plan we discussed and let me know if I can assist you in the future.   These are the goals we discussed: Goals    None      This is a list of the screening recommended for you and due dates:  Health Maintenance  Topic Date Due  . Pneumonia vaccines (2 of 2 - PPSV23) 09/30/2014  . Tetanus Vaccine  09/24/2019  . Flu Shot  Completed  . DEXA scan (bone density measurement)  Completed   We have no record of you having a pneumovax.  I suspect that you likely had one many years ago (usually given at age 84).  If you haven't had one in over 10 years, I recommend getting another one. You can get this at our office, or the pharmacy. If you get it from the pharmacy, please let us know the date.  You need the PNEUMOVAX. NOT PREVNAR-13, as you already got this, and only need that once.  I'd love to get a copy of your bone density test from your rheumatologist.  Ask when you see her next time, or whenever you are due for another one.  I recommend getting the new shingles vaccine (Shingrix). You will need to check with your insurance to see if it is covered, and if covered by Medicare Part D, you need to get from the pharmacy rather than our office.  It is a series of 2 injections, spaced 2 months apart.  Continue yearly high dose flu shots.  I recommend doing Cologard one last time before your insurance no longer covers it.  It is due again in October 2019 (and Medicare will no longer pay for it once you turn 84). So, this needs to be done October/November/December.  Follow up with Dr. Carles Collet as scheduled in March. Continue to see your therapist and psychiatrist regularly.  Try and get some exercise every day--try 10 minutes on the bike, and work your way up to doing that at least 2-3 times daily.  You may feel very tired right now, but sometimes pushing through the fatigue and getting a little exercise can actually help your mood and energy  later in the day.  Just try your best.  Be sure to eat small meals in the evenings, but frequently (feel free to eat every 2 hours, if needed, but not such a large meal that you feel sick). Your were noted to have a hiatal hernia back when you had an upper endoscopy. Here is some information regarding this diagnosis.    Hiatal Hernia A hiatal hernia occurs when part of the stomach slides above the muscle that separates the abdomen from the chest (diaphragm). A person can be born with a hiatal hernia (congenital), or it may develop over time. In almost all cases of hiatal hernia, only the top part of the stomach pushes through the diaphragm. Many people have a hiatal hernia with no symptoms. The larger the hernia, the more likely it is that you will have symptoms. In some cases, a hiatal hernia allows stomach acid to flow back into the tube that carries food from your mouth to your stomach (esophagus). This may cause heartburn symptoms. Severe heartburn symptoms may mean that you have developed a condition called gastroesophageal reflux disease (GERD). What are the causes? This condition is caused by a weakness in the opening (hiatus) where the esophagus passes through the diaphragm to attach to the upper part of the stomach. A person may be born with a weakness in the hiatus, or a weakness can develop over time. What increases the risk? This condition is more likely to develop in:  Older people. Age is a major risk factor for a hiatal hernia, especially if you are over the age of 82.  Pregnant women.  People who are overweight.  People who have frequent constipation.  What are the signs or symptoms? Symptoms of this condition usually develop in the form of GERD symptoms. Symptoms include:  Heartburn.  Belching.  Indigestion.  Trouble swallowing.  Coughing or wheezing.  Sore throat.  Hoarseness.  Chest pain.  Nausea and vomiting.  How is this diagnosed? This condition may  be diagnosed during testing for GERD. Tests that may be done include:  X-rays of your stomach or chest.  An upper gastrointestinal (GI) series. This is an X-ray exam of your GI tract that is taken after you swallow a chalky liquid that shows up clearly on the X-ray.  Endoscopy. This is a procedure to look into your stomach using a thin, flexible tube that has a tiny camera and light on the end of it.  How is this treated? This condition may be treated by:  Dietary and lifestyle changes to help reduce GERD symptoms.  Medicines. These may include: ? Over-the-counter antacids. ? Medicines that make your stomach empty more quickly. ? Medicines that block the production of stomach acid (H2  blockers). ? Stronger medicines to reduce stomach acid (proton pump inhibitors).  Surgery to repair the hernia, if other treatments are not helping.  If you have no symptoms, you may not need treatment. Follow these instructions at home: Lifestyle and activity  Do not use any products that contain nicotine or tobacco, such as cigarettes and e-cigarettes. If you need help quitting, ask your health care provider.  Try to achieve and maintain a healthy body weight.  Avoid putting pressure on your abdomen. Anything that puts pressure on your abdomen increases the amount of acid that may be pushed up into your esophagus. ? Avoid bending over, especially after eating. ? Raise the head of your bed by putting blocks under the legs. This keeps your head and esophagus higher than your stomach. ? Do not wear tight clothing around your chest or stomach. ? Try not to strain when having a bowel movement, when urinating, or when lifting heavy objects. Eating and drinking  Avoid foods that can worsen GERD symptoms. These may include: ? Fatty foods, like fried foods. ? Citrus fruits, like oranges or lemon. ? Other foods and drinks that contain acid, like orange juice or tomatoes. ? Spicy  food. ? Chocolate.  Eat frequent small meals instead of three large meals a day. This helps prevent your stomach from getting too full. ? Eat slowly. ? Do not lie down right after eating. ? Do not eat 1-2 hours before bed.  Do not drink beverages with caffeine. These include cola, coffee, cocoa, and tea.  Do not drink alcohol. General instructions  Take over-the-counter and prescription medicines only as told by your health care provider.  Keep all follow-up visits as told by your health care provider. This is important. Contact a health care provider if:  Your symptoms are not controlled with medicines or lifestyle changes.  You are having trouble swallowing.  You have coughing or wheezing that will not go away. Get help right away if:  Your pain is getting worse.  Your pain spreads to your arms, neck, jaw, teeth, or back.  You have shortness of breath.  You sweat for no reason.  You feel sick to your stomach (nauseous) or you vomit.  You vomit blood.  You have bright red blood in your stools.  You have black, tarry stools. This information is not intended to replace advice given to you by your health care provider. Make sure you discuss any questions you have with your health care provider. Document Released: 09/30/2003 Document Revised: 07/03/2016 Document Reviewed: 07/03/2016 Elsevier Interactive Patient Education  [employer].

## 2017-08-29 ENCOUNTER — Encounter: Payer: Self-pay | Admitting: Family Medicine

## 2017-08-29 ENCOUNTER — Encounter: Payer: Self-pay | Admitting: Neurology

## 2017-08-29 ENCOUNTER — Encounter (HOSPITAL_COMMUNITY): Payer: Self-pay | Admitting: Psychiatry

## 2017-08-30 ENCOUNTER — Encounter (HOSPITAL_COMMUNITY): Payer: Self-pay | Admitting: Psychiatry

## 2017-08-30 ENCOUNTER — Encounter: Payer: Self-pay | Admitting: Neurology

## 2017-08-30 DIAGNOSIS — E039 Hypothyroidism, unspecified: Secondary | ICD-10-CM | POA: Diagnosis not present

## 2017-08-31 ENCOUNTER — Encounter (HOSPITAL_COMMUNITY): Payer: Self-pay | Admitting: Psychiatry

## 2017-08-31 DIAGNOSIS — E039 Hypothyroidism, unspecified: Secondary | ICD-10-CM | POA: Diagnosis not present

## 2017-08-31 DIAGNOSIS — E063 Autoimmune thyroiditis: Secondary | ICD-10-CM | POA: Diagnosis not present

## 2017-09-03 ENCOUNTER — Ambulatory Visit: Payer: Self-pay | Admitting: Neurology

## 2017-09-03 ENCOUNTER — Ambulatory Visit (INDEPENDENT_AMBULATORY_CARE_PROVIDER_SITE_OTHER): Payer: Medicare Other | Admitting: Psychiatry

## 2017-09-03 ENCOUNTER — Telehealth (HOSPITAL_COMMUNITY): Payer: Self-pay

## 2017-09-03 ENCOUNTER — Encounter (HOSPITAL_COMMUNITY): Payer: Self-pay | Admitting: Psychiatry

## 2017-09-03 DIAGNOSIS — G20 Parkinson's disease: Secondary | ICD-10-CM

## 2017-09-03 DIAGNOSIS — Z79899 Other long term (current) drug therapy: Secondary | ICD-10-CM

## 2017-09-03 DIAGNOSIS — F332 Major depressive disorder, recurrent severe without psychotic features: Secondary | ICD-10-CM

## 2017-09-03 DIAGNOSIS — R251 Tremor, unspecified: Secondary | ICD-10-CM

## 2017-09-03 MED ORDER — TRAZODONE HCL 100 MG PO TABS: 100.0000 mg | ORAL_TABLET | Freq: Every day | ORAL | 1 refills | Status: DC

## 2017-09-03 MED ORDER — LORAZEPAM 1 MG PO TABS: 1.0000 mg | ORAL_TABLET | Freq: Three times a day (TID) | ORAL | 2 refills | Status: DC | PRN

## 2017-09-03 NOTE — Progress Notes (Signed)
BH MD/PA/NP OP Progress Note  09/03/2017 2:13 PM Tracy Malone  MRN:  [id number]  Chief Complaint: Med management HPI: Patient presents for depression follow-up.  This is complicated by Parkinson's disease.  I spent time reviewing her results from genetic testing and medication metabolism.  There are a few medications that would be agreed in terms of her metabolism.  We agreed to switch her from Xanax to Lorazepam given the effects in reducing neuromuscular rigidity.  I recommended she taper as tolerated to twice a day, then the evening only.  We agreed to start trazodone nightly as a sleep aid and titrate antidepressant effect.   Reviewed again the risks of lorazepam, increased risk of dementia, increase coping skill falls, increased risk of confusion.  Weighing risks and benefits, and patient's primary concern with quality of life, we agreed to initiate as discussed.  Specifically, my recommendation was that we start Emsam patch for treatment of depression and Parkinson's disease symptomology as well.  Unfortunately the patient does not have medication coverage for her insurance, and this would cost approximately $2000 out of pocket per month.  This is untenable for the family.  They may look into adding prescription coverage to their insurance and we can revisit this as a treatment option at that point.  I offered to resume [place] and restart at a new mapping location, and patient reports she will think about that option as well.  Visit Diagnosis:    ICD-10-CM   1. Tremor R25.1   2. Major depressive disorder, recurrent, severe without psychotic features ([place]) F33.2   3. Parkinson's disease ([place]) [place]     Past Psychiatric History: See intake H&P for full details. Reviewed, with no updates at this time.   Past Medical History:  Past Medical History:  Diagnosis Date  . Bell's palsy 1966   ? right side facial droop  . Carotid artery disease ([place]) 2010   on vascular screening;unchanged  2013.(could not tolerate simvastatin, no other statins tried)--<30% blockage bilat 07/2011  . Chronic abdominal pain   . Chronic fatigue and malaise   . Claustrophobia   . Depression    treated in the past for years;stopped in 2010 for a years  . Duodenal ulcer 1962   h/o  . Dysrhythmia    ocassional PVC's  . Fibromyalgia   . Frequent PVCs 07/2012   Seen by [facility]: benign, asymptomatic, normal EF  . GERD (gastroesophageal reflux disease)   . Glaucoma, narrow-angle    s/p laser surgery  . History of hiatal hernia    during endoscopy  . Hypothyroid 9/08  . IBS (irritable bowel syndrome)    Dr. Benson [place]  . Ocular migraine   . Osteoporosis 10/11   Dr.Hawkes  . Panic attack   . Recurrent UTI    has cystocele-Dr.Grewal  . Shingles 1999   h/o  . Superficial thrombophlebitis 03/2009   RLE  . Trochanteric bursitis 12/2008   bilateral    Past Surgical History:  Procedure Laterality Date  . ABDOMINAL HYSTERECTOMY    . CATARACT EXTRACTION, BILATERAL  1995, 1996  . Flexible sigmoidoscopy    . THYROIDECTOMY, PARTIAL  09/2005   L nodule; Dr. Harlow Asa  . TONSILLECTOMY  1946  . UPPER GI ENDOSCOPY  06/27/12  . VAGINAL HYSTERECTOMY  1971   and bladder repair.  Still has ovaries    Family Psychiatric History: See intake H&P for full details. Reviewed, with no updates at this time.   Family History:  Family  History  Problem Relation Age of Onset  . Heart disease Mother   . Hypertension Mother   . Hypertension Sister   . HIV Son   . Heart disease Brother   . Lung cancer Brother        lung  . Diabetes Maternal Grandfather     Social History:  Social History   Socioeconomic History  . Marital status: Married    Spouse name: None  . Number of children: 2  . Years of education: None  . Highest education level: None  Social Needs  . Financial resource strain: None  . Food insecurity - worry: None  . Food insecurity - inability: None  . Transportation needs - medical:  None  . Transportation needs - non-medical: None  Occupational History  . Occupation: retired (school system)  Tobacco Use  . Smoking status: Never Smoker  . Smokeless tobacco: Never Used  Substance and Sexual Activity  . Alcohol use: No    Alcohol/week: 0.0 oz  . Drug use: No  . Sexual activity: No  Other Topics Concern  . None  Social History Narrative   Married.  Son lives in [place]; Daughter [name] lives in [place]; 2 grandchildren    Allergies:  Allergies  Allergen Reactions  . Iodine Anaphylaxis    IV and topical forms.  Burnard Leigh [Hyoscyamine Sulfate]     Vision problems/pt has glaucoma  . [place] [Fish Allergy] Hives and Shortness Of Breath  . Shellfish Allergy Anaphylaxis  . Remeron [Mirtazapine] Other (See Comments)    Cause blurred vision and red eyes, pt has glaucoma  . Aspirin Other (See Comments)    Sever stomach pain due to ulcer scaring.  . Ciprofloxacin Diarrhea  . Codeine Nausea And Vomiting  . Darvocet [Propoxyphene N-Acetaminophen] Nausea And Vomiting  . Demerol [Meperidine] Nausea Only  . Dexilant [Dexlansoprazole] Swelling    Redness, swelling and peeling of both feet.  . Diphedryl [Diphenhydramine] Other (See Comments)    Increased pulse/small amount ok  . Doxycycline Hyclate Other (See Comments)    GI intolerance.  Marland Kitchen Epinephrine Other (See Comments)    Breathing problems  . Erythromycin Other (See Comments)    GI intolerance.  Yvette Rack [Cyclobenzaprine] Other (See Comments)    Tingly/prickly sensation.  Marland Kitchen Keflex [Cephalexin] Hives  . Latex Other (See Comments)    Gloves ok.  Skin gets red from elastic in underwear and latex bandaides.  . Prednisone Other (See Comments)    Headache  . Pylera [Bis Subcit-Metronid-Tetracyc] Swelling    Tongue swelling. Face tingling  . Sulfa Antibiotics Other (See Comments)    Increased pulse, fainting, diarrhea, thrush  . Xylocaine [Lidocaine Hcl]     With epinephrine, given by dentist.  Speeded up heart  rate and she passed out (occured twice, at dentist)  . Zoloft [Sertraline Hcl] Swelling and Other (See Comments)    Migraine Swelling of tongue/lip (09/2012)  . Advil [Ibuprofen] Other (See Comments)    Motrin ok with a GI effect.  . Clarithromycin Rash    Started after completing 10 day course of 2000 mg /day    Metabolic Disorder Labs: No results found for: HGBA1C, MPG No results found for: PROLACTIN Lab Results  Component Value Date   CHOL 218 (H) 11/23/2016   TRIG 123 11/23/2016   HDL 78 11/23/2016   CHOLHDL 2.8 11/23/2016   VLDL 25 11/23/2016   LDLCALC 115 (H) 11/23/2016   LDLCALC 121 05/04/2015   Lab Results  Component  Value Date   TSH 0.625 08/15/2017   TSH 1.81 11/23/2016    Therapeutic Level Labs: No results found for: LITHIUM No results found for: VALPROATE No components found for:  CBMZ  Current Medications: Current Outpatient Medications  Medication Sig Dispense Refill  . acetaminophen (TYLENOL) 650 MG CR tablet Take 650 mg by mouth as needed for pain. Reported on 09/06/2015    . b complex vitamins capsule Take 1 capsule by mouth as needed.     . Cholecalciferol (VITAMIN D) 2000 UNITS tablet Take 2,000 Units by mouth daily.    Marland Kitchen LORazepam (ATIVAN) 1 MG tablet Take 1 tablet (1 mg total) by mouth 3 (three) times daily as needed for anxiety. 90 tablet 2  . Probiotic Product (ALIGN PO) Take 1 capsule by mouth every other day.     Marland Kitchen SYNTHROID 50 MCG tablet Take 25 mg by mouth every morning.   4  . traZODone (DESYREL) 100 MG tablet Take 1 tablet (100 mg total) by mouth at bedtime. Take 1/2 tablet for 1 week, then increase to 1 tablet 90 tablet 1  . vitamin E 400 UNIT capsule Take 400 Units by mouth every Monday, Wednesday, and Friday. Reported on 09/06/2015     No current facility-administered medications for this visit.      Musculoskeletal: Strength & Muscle Tone: cogwheel and abnormal Gait & Station: shuffle Patient leans: Front  Psychiatric Specialty  Exam: ROS  There were no vitals taken for this visit.There is no height or weight on file to calculate BMI.  General Appearance: Casual and Fairly Groomed  Eye Contact:  Fair  Speech:  Slow  Volume:  Decreased  Mood:  Dysphoric  Affect:  Flat  Thought Process:  Coherent and Descriptions of Associations: Intact  Orientation:  Full (Time, Place, and Person)  Thought Content: Logical   Suicidal Thoughts:  No  Homicidal Thoughts:  No  Memory:  Immediate;   Fair  Judgement:  Fair  Insight:  Fair  Psychomotor Activity:  Shuffling Gait and Tremor  Concentration:  Concentration: Fair  Recall:  [employer] of Knowledge: Fair  Language: Fair  Akathisia:  Negative  Handed:  Right  AIMS (if indicated): not done  Assets:  Communication Skills Desire for Improvement Financial Resources/Insurance Housing Intimacy Social Support Transportation  ADL's:  Intact  Cognition: WNL  Sleep:  Poor   Screenings: ECT-MADRS     ECT Treatment from 05/07/2017 in [facility]  MADRS Total Score  31    Mini-Mental     ECT Treatment from 05/07/2017 in [facility]  Total Score (max 30 points )  30    PHQ2-9     Office Visit from 08/27/2017 in [facility] Office Visit from 09/26/2016 in [facility] Office Visit from 05/04/2015 in [facility] Visit from 03/04/2015 in [facility] Office Visit from 01/27/2015 in [facility]  PHQ-2 Total Score  4  0  2  0  0       Assessment and Plan: MARYBELLA ETHIER presents today for medication management follow-up for severe major depressive disorder in the context of Parkinson's disease.  I reviewed her genetic testing results for medication metabolism, and she remains fairly sensitive to multiple medications as evidenced on the data reviewed.  She would be a normal  metabolizer of Emsam, which would be helpful  for depression and Parkinson symptoms, but this is cost inhibited at this point.  She has a normal metabolizer of trazodone and we agreed to initiate as below for relief of sleep and depressive symptoms.  1. Tremor   2. Major depressive disorder, recurrent, severe without psychotic features ([place])   3. Parkinson's disease ([place])     Status of current problems: unchanged  Labs Ordered: No orders of the defined types were placed in this encounter.   Labs Reviewed: N/A  Collateral Obtained/Records Reviewed: N/A  Plan:  Initiate Lorazepam 1 mg 3 times a day Discontinue Xanax Once again reiterated the deleterious effects of benzodiazepines in the elderly Trazodone 50 mg nightly, increase to 100 mg in 1-2 weeks Return to clinic in 4 weeks as scheduled Continue to offer [place] if patient is agreeable  I spent 25 minutes with the patient in direct face-to-face clinical care.  Greater than 50% of this time was spent in counseling and coordination of care with the patient.    Aundra Dubin, MD 09/03/2017, 2:13 PM

## 2017-09-03 NOTE — Telephone Encounter (Signed)
Called patients husband and let him know

## 2017-09-03 NOTE — Telephone Encounter (Signed)
If she wants to come at 1 pm today that is okay

## 2017-09-03 NOTE — Telephone Encounter (Signed)
Patients husband called and he is very concerned and would like to know if you can work her in. I was unable to find an appointment close, so can you let me know?

## 2017-09-04 ENCOUNTER — Encounter (HOSPITAL_COMMUNITY): Payer: Self-pay | Admitting: Psychiatry

## 2017-09-04 DIAGNOSIS — L853 Xerosis cutis: Secondary | ICD-10-CM | POA: Diagnosis not present

## 2017-09-04 DIAGNOSIS — L820 Inflamed seborrheic keratosis: Secondary | ICD-10-CM | POA: Diagnosis not present

## 2017-09-04 DIAGNOSIS — B351 Tinea unguium: Secondary | ICD-10-CM | POA: Diagnosis not present

## 2017-09-04 DIAGNOSIS — D225 Melanocytic nevi of trunk: Secondary | ICD-10-CM | POA: Diagnosis not present

## 2017-09-04 DIAGNOSIS — L570 Actinic keratosis: Secondary | ICD-10-CM | POA: Diagnosis not present

## 2017-09-04 DIAGNOSIS — L814 Other melanin hyperpigmentation: Secondary | ICD-10-CM | POA: Diagnosis not present

## 2017-09-04 DIAGNOSIS — L821 Other seborrheic keratosis: Secondary | ICD-10-CM | POA: Diagnosis not present

## 2017-09-04 DIAGNOSIS — D1721 Benign lipomatous neoplasm of skin and subcutaneous tissue of right arm: Secondary | ICD-10-CM | POA: Diagnosis not present

## 2017-09-06 ENCOUNTER — Encounter (HOSPITAL_COMMUNITY): Payer: Self-pay | Admitting: Psychiatry

## 2017-09-06 DIAGNOSIS — H5713 Ocular pain, bilateral: Secondary | ICD-10-CM | POA: Diagnosis not present

## 2017-09-07 ENCOUNTER — Encounter (HOSPITAL_COMMUNITY): Payer: Self-pay | Admitting: Psychiatry

## 2017-09-13 ENCOUNTER — Encounter (HOSPITAL_COMMUNITY): Payer: Self-pay | Admitting: Psychiatry

## 2017-09-14 ENCOUNTER — Other Ambulatory Visit (HOSPITAL_COMMUNITY): Payer: Self-pay | Admitting: Psychiatry

## 2017-09-14 ENCOUNTER — Telehealth (HOSPITAL_COMMUNITY): Payer: Self-pay

## 2017-09-14 MED ORDER — BUPROPION HCL ER (XL) 150 MG PO TB24: 150.0000 mg | ORAL_TABLET | ORAL | 2 refills | Status: DC

## 2017-09-14 MED ORDER — ALPRAZOLAM 0.5 MG PO TABS
ORAL_TABLET | ORAL | 1 refills | Status: DC
Start: 2017-09-14 — End: 2017-11-15

## 2017-09-14 NOTE — Telephone Encounter (Signed)
Patient's daughter had called concerned about mom's depression. Wants to know if patient could possibly be put on another medication. Please advise

## 2017-09-16 ENCOUNTER — Encounter: Payer: Self-pay | Admitting: Family Medicine

## 2017-09-17 NOTE — Telephone Encounter (Signed)
I already spoke with them, thank you!

## 2017-09-18 DIAGNOSIS — H04123 Dry eye syndrome of bilateral lacrimal glands: Secondary | ICD-10-CM | POA: Diagnosis not present

## 2017-09-18 DIAGNOSIS — H0100A Unspecified blepharitis right eye, upper and lower eyelids: Secondary | ICD-10-CM | POA: Diagnosis not present

## 2017-09-18 DIAGNOSIS — H0100B Unspecified blepharitis left eye, upper and lower eyelids: Secondary | ICD-10-CM | POA: Diagnosis not present

## 2017-09-20 NOTE — Progress Notes (Signed)
Tracy Malone was seen today in the movement disorders clinic for neurologic consultation at the request of [name], MD.  The consultation is for the evaluation of R hand tremor.  Pt states that it started in November.  It seemed to come on abruptly but she doesn't remember.  It seems to come and go but overall hasn't gotten worse.  She notes it the most with eating but it is also is noted at rest.  She is right hand dominant.  She never notes it in the left hand or legs.  No family hx of tremor.  The records that were made available to me were reviewed.  This patient is accompanied in the office by her spouse who supplements the history.    Tremor: Yes.     Affected by caffeine:  No. (rarely drinks coke)  Affected by alcohol: doesn't drink alcohol  Affected by stress:  Yes.    Affected by fatigue:  Yes.    Spills soup if on spoon:  No.  Spills glass of liquid if full:  No.   Other sx's: Voice: no change per pt (she thinks that she has had to try to speak louder due to her husbands HOH) Sleep: trouble getting to sleep and trouble staying asleep  Vivid Dreams:  No.  Acting out dreams:  No. Wet Pillows: No. Postural symptoms:  No. (not unless takes restoril for sleep)  Falls?  No. Bradykinesia symptoms: trouble getting out of bed; walks slower than in the past and attributes to R knee pain after injury Loss of smell:  Unsure - states that she keeps smelling a chemical Loss of taste:  No. Urinary Incontinence:  No. Difficulty Swallowing:  Yes.   with pills Handwriting, micrographia: Yes.   Trouble with ADL's:  No.   Trouble buttoning clothing: No. Depression:  No., but admits to anxiety Memory changes:  No. Hallucinations:  No.  visual distortions: No. N/V:  No. Lightheaded:  No.  Syncope: No. Diplopia:  No. Dyskinesia:  No.  Neuroimaging has not previously been performed.   04/05/17 update:  I have not seen this patient in over 2 years.  The records that were made  available to me were reviewed.  She is accompanied by her husband and daughter, who supplement the history.  I have spoken to her psychiatrist.  I felt that she likely had early PD at our last visit.  I recommended carbidopa/levodopa 25/100.  She did not take this and transferred care to [name].  He felt that she did not have PD but rather ET.  Xanax was recommended for her tremor.  When she last saw [name], on 03/16/17, he started her on propranolol LA, 60 mg because of increased tremor. This did help some.   She has been seeing Dr. [name] at [facility] and he felt that she had sx's consistent with PD and recommend she come to see me.  She states that she has had no falls.  She states that she has normal balance but states that she has back pain today and that has caused some trouble.  No dreams.  No acting out of the dreams.  Handwriting is very little.  Still having trouble with smelling things that are not there.  No visual distortions.  No hallucinations.  No trouble with ADL's/buttoning clothing.  Doing okay with swallowing.  Taking xanax 0.5 mg tid.  Still with depression.  Holding Lowgap for now.  Independent of depression, she  has crying spells.  09/21/17 update: Patient is seen back in follow-up today.  Many records reviewed since our last visit.  Pt accompanied by daughter and husband who supplements the history.  I started her on levodopa last visit.  Her daughter called only a few days after her visit to state that she took 1/2 tablet and developed burning and swelling in the lip/mouth.  She called her pharmacist and told her to stop taking the medication.  I gave her a new titration schedule that was very slow and they reported that even a half a tablet once per day caused red and puffy lips and swelling in the mouth.  We discussed that there really are not other options in her age group and that I did not think that this was likely a side effect of levodopa.  Her daughter reported that  depression was a bigger issue and they wanted to hold on further attempts with levodopa.  She has been to psychiatry.  Numerous records have been reviewed.  She started ECT treatments for depression.  That seemed to help some, but she developed some memory change after 3 treatments and decided to hold.  She remains faithful with follow-up with psychiatry.  Trazodone was started but she reports it caused blurred vision and she d/c it and started wellbutrin last week.  She saw ophthalmology and was told she just had dry eye.  Her Xanax was changed to lorazepam.  The patient has been in contact with [name] via email since our last visit.  She reported that she needed to see him quickly because she was told that she had Parkinson's disease.  One fall since our visit where she fell off poorly inflated exercise ball.  Daughter asks me about the crying spells independent of depression.  PREVIOUS MEDICATIONS: none to date  ALLERGIES:   Allergies  Allergen Reactions  . Iodine Anaphylaxis    IV and topical forms.  Burnard Leigh [Hyoscyamine Sulfate]     Vision problems/pt has glaucoma  . Salmon [Fish Allergy] Hives and Shortness Of Breath  . Shellfish Allergy Anaphylaxis  . Remeron [Mirtazapine] Other (See Comments)    Cause blurred vision and red eyes, pt has glaucoma  . Aspirin Other (See Comments)    Sever stomach pain due to ulcer scaring.  . Ciprofloxacin Diarrhea  . Codeine Nausea And Vomiting  . Darvocet [Propoxyphene N-Acetaminophen] Nausea And Vomiting  . Demerol [Meperidine] Nausea Only  . Dexilant [Dexlansoprazole] Swelling    Redness, swelling and peeling of both feet.  . Diphedryl [Diphenhydramine] Other (See Comments)    Increased pulse/small amount ok  . Doxycycline Hyclate Other (See Comments)    GI intolerance.  Marland Kitchen Epinephrine Other (See Comments)    Breathing problems  . Erythromycin Other (See Comments)    GI intolerance.  Yvette Rack [Cyclobenzaprine] Other (See Comments)     Tingly/prickly sensation.  Marland Kitchen Keflex [Cephalexin] Hives  . Latex Other (See Comments)    Gloves ok.  Skin gets red from elastic in underwear and latex bandaides.  . Prednisone Other (See Comments)    Headache  . Pylera [Bis Subcit-Metronid-Tetracyc] Swelling    Tongue swelling. Face tingling  . Sulfa Antibiotics Other (See Comments)    Increased pulse, fainting, diarrhea, thrush  . Xylocaine [Lidocaine Hcl]     With epinephrine, given by dentist.  Speeded up heart rate and she passed out (occured twice, at dentist)  . Zoloft [Sertraline Hcl] Swelling and Other (See Comments)  Migraine Swelling of tongue/lip (09/2012)  . Advil [Ibuprofen] Other (See Comments)    Motrin ok with a GI effect.  . Clarithromycin Rash    Started after completing 10 day course of 2000 mg /day    CURRENT MEDICATIONS:  Outpatient Encounter Medications as of 09/21/2017  Medication Sig  . acetaminophen (TYLENOL) 650 MG CR tablet Take 650 mg by mouth as needed for pain. Reported on 09/06/2015  . ALPRAZolam (XANAX) 0.5 MG tablet Use up to 3 times daily.  Warning: benzodiazepines increase the risk of falls and neurocognitive decline in the elderly  . b complex vitamins capsule Take 1 capsule by mouth as needed.   Marland Kitchen buPROPion (WELLBUTRIN XL) 150 MG 24 hr tablet Take 1 tablet (150 mg total) by mouth every morning.  . Cholecalciferol (VITAMIN D) 2000 UNITS tablet Take 2,000 Units by mouth daily.  . Probiotic Product (ALIGN PO) Take 1 capsule by mouth every other day.   Marland Kitchen SYNTHROID 50 MCG tablet Take 25 mcg by mouth every morning.   . vitamin E 400 UNIT capsule Take 400 Units by mouth every Monday, Wednesday, and Friday. Reported on 09/06/2015  . Carbidopa-Levodopa ER (SINEMET CR) 25-100 MG tablet controlled release Take 1 tablet by mouth 3 (three) times daily.   No facility-administered encounter medications on file as of 09/21/2017.     PAST MEDICAL HISTORY:   Past Medical History:  Diagnosis Date  . Bell's palsy  1966   ? right side facial droop  . Carotid artery disease ([place]) 2010   on vascular screening;unchanged 2013.(could not tolerate simvastatin, no other statins tried)--<30% blockage bilat 07/2011  . Chronic abdominal pain   . Chronic fatigue and malaise   . Claustrophobia   . Depression    treated in the past for years;stopped in 2010 for a years  . Duodenal ulcer 1962   h/o  . Dysrhythmia    ocassional PVC's  . Fibromyalgia   . Frequent PVCs 07/2012   Seen by [facility]: benign, asymptomatic, normal EF  . GERD (gastroesophageal reflux disease)   . Glaucoma, narrow-angle    s/p laser surgery  . History of hiatal hernia    during endoscopy  . Hypothyroid 9/08  . IBS (irritable bowel syndrome)    Dr. Benson Norway  . Ocular migraine   . Osteoporosis 10/11   Dr.Hawkes  . Panic attack   . Recurrent UTI    has cystocele-Dr.Grewal  . Shingles 1999   h/o  . Superficial thrombophlebitis 03/2009   RLE  . Trochanteric bursitis 12/2008   bilateral    PAST SURGICAL HISTORY:   Past Surgical History:  Procedure Laterality Date  . ABDOMINAL HYSTERECTOMY    . CATARACT EXTRACTION, BILATERAL  1995, 1996  . Flexible sigmoidoscopy    . THYROIDECTOMY, PARTIAL  09/2005   L nodule; Dr. Harlow Asa  . TONSILLECTOMY  1946  . UPPER GI ENDOSCOPY  06/27/12  . VAGINAL HYSTERECTOMY  1971   and bladder repair.  Still has ovaries    SOCIAL HISTORY:   Social History   Socioeconomic History  . Marital status: Married    Spouse name: Not on file  . Number of children: 2  . Years of education: Not on file  . Highest education level: Not on file  Social Needs  . Financial resource strain: Not on file  . Food insecurity - worry: Not on file  . Food insecurity - inability: Not on file  . Transportation needs - medical: Not on file  .  Transportation needs - non-medical: Not on file  Occupational History  . Occupation: retired (school system)  Tobacco Use  . Smoking status: Never Smoker  . Smokeless  tobacco: Never Used  Substance and Sexual Activity  . Alcohol use: No    Alcohol/week: 0.0 oz  . Drug use: No  . Sexual activity: No  Other Topics Concern  . Not on file  Social History Narrative   Married.  Son lives in [place]; Daughter [name] lives in [place]; 2 grandchildren    FAMILY HISTORY:   Family Status  Relation Name Status  . Mother  Deceased at age 58       MI  . Father  Deceased at age 31       arteriosclerosis  . Sister 2 Alive       2, alive and well  . Brother  Deceased at age 1       "natural causes"--had PTSD  . Daughter  Alive  . Son  Alive  . Brother  Deceased       CAD  . Brother  Deceased       lung CA  . MGF  (Not Specified)    ROS:  A complete 10 system review of systems was obtained and was unremarkable apart from what is mentioned above.  PHYSICAL EXAMINATION:    VITALS:   Vitals:   09/21/17 1458  BP: 120/76  Pulse: 88  SpO2: 96%  Weight: 103 lb (46.7 kg)  Height: _0  (1.499 m)    GEN:  The patient appears stated age and is in NAD.  She is intermittently very tearful throughout the examination (but appropriately tearful) HEENT:  Normocephalic, atraumatic.  The mucous membranes are moist. The superficial temporal arteries are without ropiness or tenderness. CV:  RRR Lungs:  CTAB Neck/HEME:  There are no carotid bruits bilaterally.  Neurological examination:  Orientation: The patient is alert and oriented x3. Fund of knowledge is appropriate.  Recent and remote memory are intact.  Attention and concentration are normal.    Able to name objects and repeat phrases. Cranial nerves: There is good facial symmetry. . Extraocular muscles are intact. The visual fields are full to confrontational testing. The speech is fluent and clear.   She has hypophonic speech.  Soft palate rises symmetrically and there is no tongue deviation. Hearing is intact to conversational tone. Sensation: Sensation is intact to light touch throughout. Motor: Strength  is 5/5 in the bilateral upper and lower extremities.   Shoulder shrug is equal and symmetric.  There is no pronator drift.  Movement examination: Tone: There is mild increased tone in the RUE that becomes at least moderate with activation procedures.  Tone elsewhere is normal.   Abnormal movements: There is a near constant RUE resting tremor that increases with distraction procedures.  Coordination:  There is decremation with RAM's, seen with finger taps, alternation of supination/pronation of the forearm on the right and hand opening and closing on the right.  She has trouble alternating supination and pronation of the forearm, hand opening and closing, finger taps on the L.  Heel taps are good bilaterally.  Toe taps are slow bilaterally but slower on the right. Gait and Station: She is able to arise out of the chair without the use of her hands.  When she ambulates, she drags the R leg and has decreased arm swing on the right.  Labs:  Lab Results  Component Value Date   TSH 0.625 08/15/2017  ASSESSMENT/PLAN:  1.  Idiopathic Parkinson's disease.  The patient has tremor, bradykinesia, rigidity and mild postural instability.  -I had a long discussion with the patient today.  She was seen by me in 2016 and diagnosed with Parkinson's disease.  She transferred care to [name] who felt that she did not have Parkinson's disease, but rather essential tremor.  She then returned back to our office in 2018, but has been emailing [name] about the diagnosis of Parkinson's disease.  I discussed with the patient that I have no objection to her going back to [name], but it is not in her best interest to be going back and forth, especially since there is a discrepancy in diagnosis and therefore treatment.  We discussed this last visit as well.  I also told patient that I will be happy to get her a third opinion if she would like, but ultimately she opted to stay here.  -Patient tried half a tablet of  levodopa on 2 separate occasions and felt that it caused lip burning and some swelling.  This would be incredibly unusual, and given her more than 24 allergies listed, I suspect that she just has sensitivities and not true allergies.  In fact, when I reviewed her allergy list many of these medications are reported to cause facial tingling, and swelling of the lips and tongue.  I wonder if the patient is having anxiety about taking new medications, and the patient's husband and daughter agree that this is likely the case.  I discussed with the patient that without levodopa, she really has no other options in medication given her age.  Ultimately, she decided to try to change to the CR formulation of levodopa and work very slowly up to 1 tablet 3 times per day.  We discussed what this meant.  -daughter asked me about neurorehab programs but patient refused.  I did tell her I thought it would be helpful for her to get involved with community programs.  She also met with our social worker today about various community programs available.  2.  PBA   -It does sound like it is possible that the patient has pseudobulbar affect, in addition to depression.  We talked about Nuedexta, but I really do not want to add too different medications at once.  They asked me about this today and I reiterated that since we are starting levodopa again, we would hold on this.  She is also working with her psychiatrist on various medications, but like many of her other medications she finds intolerance to them.    3.  Depression  -Patient is faithfully following with psychiatry.  I think this is likely her biggest issue  -I did address her Xanax.  I would like to see her off of daytime Xanax.  This decreases her cognition and increases risk for falls.  They will work with psychiatry regarding this medication.  4.  Follow up is anticipated in the next few months, sooner should new neurologic issues arise.  Much greater than 50% of  this visit was spent in counseling with patient, daughter and husband.  Total face to face time:  40 min.  This did not include the 35 min of record review which was detailed above, which was non face to face time.

## 2017-09-21 ENCOUNTER — Encounter: Payer: Self-pay | Admitting: Neurology

## 2017-09-21 ENCOUNTER — Ambulatory Visit (INDEPENDENT_AMBULATORY_CARE_PROVIDER_SITE_OTHER): Payer: Medicare Other | Admitting: Neurology

## 2017-09-21 ENCOUNTER — Encounter: Payer: Self-pay | Admitting: Psychology

## 2017-09-21 VITALS — BP 120/76 | HR 88 | Ht 59.0 in | Wt 103.0 lb

## 2017-09-21 DIAGNOSIS — F331 Major depressive disorder, recurrent, moderate: Secondary | ICD-10-CM | POA: Diagnosis not present

## 2017-09-21 DIAGNOSIS — G20 Parkinson's disease: Secondary | ICD-10-CM

## 2017-09-21 DIAGNOSIS — F482 Pseudobulbar affect: Secondary | ICD-10-CM | POA: Diagnosis not present

## 2017-09-21 MED ORDER — CARBIDOPA-LEVODOPA ER 25-100 MG PO TBCR: 1.0000 | EXTENDED_RELEASE_TABLET | Freq: Three times a day (TID) | ORAL | 2 refills | Status: DC

## 2017-09-21 NOTE — Patient Instructions (Signed)
1. Start Carbidopa Levodopa 25/100 CR - one tablet daily for one week,  Then one tablet twice daily for one week,  Then one tablet three times daily  Prescription has been sent to your pharmacy.

## 2017-09-21 NOTE — Progress Notes (Signed)
I met with the patient, her husband and her daughter while they were in the clinic today.  We talked about resources in our community for Parkinson's disease.  I reviewed the social and support programs.  In addition the exercise program and encouraged him to attend.  They can call me with any questions that they may have in the future.

## 2017-09-22 DIAGNOSIS — R319 Hematuria, unspecified: Secondary | ICD-10-CM | POA: Diagnosis not present

## 2017-09-22 DIAGNOSIS — R300 Dysuria: Secondary | ICD-10-CM | POA: Diagnosis not present

## 2017-09-24 ENCOUNTER — Encounter (HOSPITAL_COMMUNITY): Payer: Self-pay | Admitting: Psychiatry

## 2017-09-24 ENCOUNTER — Ambulatory Visit (INDEPENDENT_AMBULATORY_CARE_PROVIDER_SITE_OTHER): Payer: Medicare Other | Admitting: Psychiatry

## 2017-09-24 VITALS — BP 149/79 | HR 79 | Ht 59.0 in | Wt 104.6 lb

## 2017-09-24 DIAGNOSIS — G20 Parkinson's disease: Secondary | ICD-10-CM | POA: Diagnosis not present

## 2017-09-24 DIAGNOSIS — F332 Major depressive disorder, recurrent severe without psychotic features: Secondary | ICD-10-CM

## 2017-09-24 DIAGNOSIS — G4700 Insomnia, unspecified: Secondary | ICD-10-CM | POA: Diagnosis not present

## 2017-09-24 DIAGNOSIS — F410 Panic disorder [episodic paroxysmal anxiety] without agoraphobia: Secondary | ICD-10-CM | POA: Diagnosis not present

## 2017-09-24 DIAGNOSIS — G20A1 Parkinson's disease without dyskinesia, without mention of fluctuations: Secondary | ICD-10-CM

## 2017-09-24 MED ORDER — TRAZODONE HCL 50 MG PO TABS: ORAL_TABLET | ORAL | 0 refills | Status: DC

## 2017-09-24 MED ORDER — FLUOXETINE HCL 10 MG PO CAPS: 10.0000 mg | ORAL_CAPSULE | Freq: Every day | ORAL | 1 refills | Status: DC

## 2017-09-24 NOTE — Progress Notes (Signed)
Tracy Malone Progress Note  09/24/2017 10:51 AM Tracy Malone  MRN:  [id number]  Chief Complaint:  down, sad HPI: Tracy Malone reports that she still is down and depressed, does feel like the Wellbutrin has helped her energy a little bit.  She reports that the trazodone low-dose 25-50 mg seem to help her with sleep and did not have side effects.  She wonders about still taking a low-dose of trazodone rather than increasing trazodone to a high antidepressant dose.  We agreed to continue Wellbutrin and continue a low-dose of trazodone.  She reports that she had done well in the past with Prozac at a low dose of 10 mg.  We agreed to restart Prozac 10 mg to take in conjunction with Wellbutrin.  Spent time reviewing the risks of Xanax and agreed that given her severe anxiety, it was beneficial for her quality of life to continue Xanax on an as-needed basis.  Spent time discussing that we may find better results with using low doses of multiple medicines rather than a high dose of 1 medicine, and she may be able to better tolerate the low doses of multiple medicines.  I also recommended family have a geriatric psych consultation with [facility] as this would help to guide this provider if there was any other additional interventions she would benefit from.  They were agreeable to this.  She is also coordinated with her neurologist and has decided to restart Sinemet and to do a gradual titration are much slower than her previous attempt.  I echoed my support of this  Visit Diagnosis:    ICD-10-CM   1. Parkinson's disease ([place]) G20   2. Major depressive disorder, recurrent, severe without psychotic features ([place]) F33.2   3. Panic disorder F41.0     Past Psychiatric History: See intake H&P for full details. Reviewed, with no updates at this time.   Past Medical History:  Past Medical History:  Diagnosis Date  . Bell's palsy 1966   ? right side facial droop  . Carotid artery disease ([place]) 2010   on vascular screening;unchanged 2013.(could not tolerate simvastatin, no other statins tried)--<30% blockage bilat 07/2011  . Chronic abdominal pain   . Chronic fatigue and malaise   . Claustrophobia   . Depression    treated in the past for years;stopped in 2010 for a years  . Duodenal ulcer 1962   h/o  . Dysrhythmia    ocassional PVC's  . Fibromyalgia   . 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 GERD (gastroesophageal reflux disease)   . Glaucoma, narrow-angle    s/p laser surgery  . History of hiatal hernia    during endoscopy  . Hypothyroid 9/08  . IBS (irritable bowel syndrome)    Dr. Benson Norway  . Ocular migraine   . Osteoporosis 10/11   Dr.Hawkes  . Panic attack   . Recurrent UTI    has cystocele-Dr.Grewal  . Shingles 1999   h/o  . Superficial thrombophlebitis 03/2009   RLE  . Trochanteric bursitis 12/2008   bilateral    Past Surgical History:  Procedure Laterality Date  . ABDOMINAL HYSTERECTOMY    . CATARACT EXTRACTION, BILATERAL  1995, 1996  . Flexible sigmoidoscopy    . THYROIDECTOMY, PARTIAL  09/2005   L nodule; Dr. Harlow Asa  . TONSILLECTOMY  1946  . UPPER GI ENDOSCOPY  06/27/12  . VAGINAL HYSTERECTOMY  1971   and bladder repair.  Still  has ovaries    Family Psychiatric History: See intake H&P for full details. Reviewed, with no updates at this time.   Family History:  Family History  Problem Relation Age of Onset  . Heart disease Mother   . Hypertension Mother   . Hypertension Sister   . HIV Son   . Heart disease Brother   . Lung cancer Brother        lung  . Diabetes Maternal Grandfather     Social History:  Social History   Socioeconomic History  . Marital status: Married    Spouse name: None  . Number of children: 2  . Years of education: None  . Highest education level: None  Social Needs  . Financial resource strain: None  . Food insecurity - worry: None  . Food insecurity - inability: None  .  Transportation needs - medical: None  . Transportation needs - non-medical: None  Occupational History  . Occupation: retired (school system)  Tobacco Use  . Smoking status: Never Smoker  . Smokeless tobacco: Never Used  Substance and Sexual Activity  . Alcohol use: No    Alcohol/week: 0.0 oz  . Drug use: No  . Sexual activity: No  Other Topics Concern  . None  Social History Narrative   Married.  Son lives in [place]; Daughter [name] lives in [place]; 2 grandchildren    Allergies:  Allergies  Allergen Reactions  . Iodine Anaphylaxis    IV and topical forms.  Burnard Leigh [Hyoscyamine Sulfate]     Vision problems/pt has glaucoma  . Salmon [Fish Allergy] Hives and Shortness Of Breath  . Shellfish Allergy Anaphylaxis  . Remeron [Mirtazapine] Other (See Comments)    Cause blurred vision and red eyes, pt has glaucoma  . Aspirin Other (See Comments)    Sever stomach pain due to ulcer scaring.  . Ciprofloxacin Diarrhea  . Codeine Nausea And Vomiting  . Darvocet [Propoxyphene N-Acetaminophen] Nausea And Vomiting  . Demerol [Meperidine] Nausea Only  . Dexilant [Dexlansoprazole] Swelling    Redness, swelling and peeling of both feet.  . Diphedryl [Diphenhydramine] Other (See Comments)    Increased pulse/small amount ok  . Doxycycline Hyclate Other (See Comments)    GI intolerance.  Marland Kitchen Epinephrine Other (See Comments)    Breathing problems  . Erythromycin Other (See Comments)    GI intolerance.  Yvette Rack [Cyclobenzaprine] Other (See Comments)    Tingly/prickly sensation.  Marland Kitchen Keflex [Cephalexin] Hives  . Latex Other (See Comments)    Gloves ok.  Skin gets red from elastic in underwear and latex bandaides.  . Prednisone Other (See Comments)    Headache  . Pylera [Bis Subcit-Metronid-Tetracyc] Swelling    Tongue swelling. Face tingling  . Sulfa Antibiotics Other (See Comments)    Increased pulse, fainting, diarrhea, thrush  . Xylocaine [Lidocaine Hcl]     With epinephrine, given  by dentist.  Speeded up heart rate and she passed out (occured twice, at dentist)  . Zoloft [Sertraline Hcl] Swelling and Other (See Comments)    Migraine Swelling of tongue/lip (09/2012)  . Advil [Ibuprofen] Other (See Comments)    Motrin ok with a GI effect.  . Clarithromycin Rash    Started after completing 10 day course of 2000 mg /day    Metabolic Disorder Labs: No results found for: HGBA1C, MPG No results found for: PROLACTIN Lab Results  Component Value Date   CHOL 218 (H) 11/23/2016   TRIG 123 11/23/2016   HDL 78 11/23/2016  CHOLHDL 2.8 11/23/2016   VLDL 25 11/23/2016   LDLCALC 115 (H) 11/23/2016   LDLCALC 121 05/04/2015   Lab Results  Component Value Date   TSH 0.625 08/15/2017   TSH 1.81 11/23/2016    Therapeutic Level Labs: No results found for: LITHIUM No results found for: VALPROATE No components found for:  CBMZ  Current Medications: Current Outpatient Medications  Medication Sig Dispense Refill  . acetaminophen (TYLENOL) 650 MG CR tablet Take 650 mg by mouth as needed for pain. Reported on 09/06/2015    . ALPRAZolam (XANAX) 0.5 MG tablet Use up to 3 times daily.  Warning: benzodiazepines increase the risk of falls and neurocognitive decline in the elderly 90 tablet 1  . b complex vitamins capsule Take 1 capsule by mouth as needed.     Marland Kitchen buPROPion (WELLBUTRIN XL) 150 MG 24 hr tablet Take 1 tablet (150 mg total) by mouth every morning. 30 tablet 2  . Cholecalciferol (VITAMIN D) 2000 UNITS tablet Take 2,000 Units by mouth daily.    . Probiotic Product (ALIGN PO) Take 1 capsule by mouth every other day.     Marland Kitchen SYNTHROID 50 MCG tablet Take 25 mcg by mouth every morning.   4  . vitamin E 400 UNIT capsule Take 400 Units by mouth every Monday, Wednesday, and Friday. Reported on 09/06/2015    . Carbidopa-Levodopa ER (SINEMET CR) 25-100 MG tablet controlled release Take 1 tablet by mouth 3 (three) times daily. 90 tablet 2  . FLUoxetine (PROZAC) 10 MG capsule Take 1  capsule (10 mg total) by mouth daily. Take in the morning with wellbutrin 90 capsule 1  . traZODone (DESYREL) 50 MG tablet 25-50 mg nightly 90 tablet 0   No current facility-administered medications for this visit.      Musculoskeletal: Strength & Muscle Tone: within normal limits Gait & Station: normal Patient leans: N/A  Psychiatric Specialty Exam: ROS  Blood pressure (!) 149/79, pulse 79, height 4\' 11"  (1.499 m), weight 104 lb 9.6 oz (47.4 kg).Body mass index is 21.13 kg/m.  General Appearance: Casual and Fairly Groomed  Eye Contact:  Fair  Speech:  Normal Rate  Volume:  Decreased  Mood:  Dysphoric  Affect:  Flat  Thought Process:  Goal Directed and Descriptions of Associations: Intact  Orientation:  Full (Time, Place, and Person)  Thought Content: Logical   Suicidal Thoughts:  No  Homicidal Thoughts:  No  Memory:  Immediate;   Fair  Judgement:  Fair  Insight:  Fair  Psychomotor Activity:  Psychomotor Retardation, Shuffling Gait and Tremor  Concentration:  Concentration: Good  Recall:  [employer] of Knowledge: Fair  Language: Fair  Akathisia:  Negative  Handed:  Right  AIMS (if indicated): not done  Assets:  Communication Skills Desire for Improvement Financial Resources/Insurance Housing Intimacy Social Support Transportation  ADL's:  Intact  Cognition: WNL  Sleep:  Fair   Screenings: ECT-MADRS     ECT Treatment from 05/07/2017 in [facility]  MADRS Total Score  31    Mini-Mental     ECT Treatment from 05/07/2017 in [facility]  Total Score (max 30 points )  30    PHQ2-9     Office Visit from 08/27/2017 in [place] Visit from 09/26/2016 in [facility] for [facility]ious Disease Office Visit from 05/04/2015 in [place] Visit from 03/04/2015 in [facility] for [facility] Office Visit  from 01/27/2015 in [facility] for Infectious Disease  PHQ-2 Total Score  4  0  2  0  0      Assessment and Plan: ISMAHAN LIPPMAN presents with complex depression related to Parkinson's disease and associated neurocognitive changes.  We spent time today discussing sensible polypharmacy at low doses to help alleviate some of her symptoms.  I also suggested she participate in the Parkinson's disease support group, and she shares that her neurologist also recommended this.  She has excellent support from her family, no suicidal thoughts.  We will proceed as below and follow-up in 4-6 weeks.  1. Parkinson's disease ([place])   2. Major depressive disorder, recurrent, severe without psychotic features ([place])   3. Panic disorder     Status of current problems: unchanged  Labs Ordered: No orders of the defined types were placed in this encounter.   Labs Reviewed: n/a  Collateral Obtained/Records Reviewed: family present, husband and daughter  Plan:  Continue Wellbutrin XL 150 mg daily Low-dose trazodone 25-50 mg nightly for sleep only Xanax 0.25-0.5 mg 3 times a day as needed for anxiety Initiate low-dose Prozac 10 mg; I suggested patient wait for a couple weeks before she start Prozac, so that she could assess her response to Sinemet Return to clinic in 4-6 weeks Gero-psych consultation with [facility]  I spent 25 minutes with the patient in direct face-to-face clinical care.  Greater than 50% of this time was spent in counseling and coordination of care with the patient.    Aundra Dubin, MD 09/24/2017, 10:51 AM

## 2017-09-24 NOTE — Patient Instructions (Signed)
Start the Sinemet (L-Dopa) per neurology instructions   Go ahead and add back low dose trazodone (25 mg) for sleep only and leave it around 25-50 mg  In a couple weeks, start prozac in the morning with wellbutrin

## 2017-09-25 ENCOUNTER — Encounter: Payer: Self-pay | Admitting: Podiatry

## 2017-09-25 ENCOUNTER — Ambulatory Visit (INDEPENDENT_AMBULATORY_CARE_PROVIDER_SITE_OTHER): Payer: Medicare Other | Admitting: Podiatry

## 2017-09-25 DIAGNOSIS — B351 Tinea unguium: Secondary | ICD-10-CM

## 2017-09-25 DIAGNOSIS — Q828 Other specified congenital malformations of skin: Secondary | ICD-10-CM | POA: Diagnosis not present

## 2017-09-25 DIAGNOSIS — M79676 Pain in unspecified toe(s): Secondary | ICD-10-CM | POA: Diagnosis not present

## 2017-09-25 NOTE — Progress Notes (Addendum)
Complaint:  Visit Type: Patient returns to my office for continued preventative foot care services. Complaint: Patient states" my nails have grown long and thick and become painful to walk and wear shoes" . The patient presents for preventative foot care services. No changes to ROS.  Patient has painful callus both feet.  Podiatric Exam: Vascular: dorsalis pedis and posterior tibial pulses are palpable bilateral. Capillary return is immediate. Temperature gradient is WNL. Skin turgor WNL  Sensorium: Normal Semmes Weinstein monofilament test. Normal tactile sensation bilaterally. Nail Exam: Pt has thick disfigured discolored nails with subungual debris noted bilateral entire nail hallux through fifth toenails Ulcer Exam: There is no evidence of ulcer or pre-ulcerative changes or infection. Orthopedic Exam: Muscle tone and strength are WNL. No limitations in general ROM. No crepitus or effusions noted. Foot type and digits show no abnormalities. Bony prominences are unremarkable. Skin:  Porokeratosis sub 1 left and sub 5th  B/L.   No infection or ulcers  Diagnosis:  Onychomycosis, , Pain in right toe, pain in left toes,  Porokeratosis  Treatment & Plan Procedures and Treatment: Consent by patient was obtained for treatment procedures.   Debridement of mycotic and hypertrophic toenails, 1 through 5 bilateral and clearing of subungual debris. No ulceration, no infection noted. Debride porokeratosis  B/L  ABN signed for 1019. Return Visit-Office Procedure: Patient instructed to return to the office for a follow up visit 3 months for continued evaluation and treatment.    Gardiner Barefoot DPM

## 2017-09-26 DIAGNOSIS — Z01419 Encounter for gynecological examination (general) (routine) without abnormal findings: Secondary | ICD-10-CM | POA: Diagnosis not present

## 2017-09-26 DIAGNOSIS — Z6820 Body mass index (BMI) 20.0-20.9, adult: Secondary | ICD-10-CM | POA: Diagnosis not present

## 2017-09-27 ENCOUNTER — Telehealth: Payer: Self-pay

## 2017-09-27 NOTE — Telephone Encounter (Signed)
Pt was notified.  

## 2017-09-27 NOTE — Telephone Encounter (Signed)
Ok to stop the antibiotic since I am seeing her tomorrow

## 2017-09-27 NOTE — Telephone Encounter (Signed)
Please advise of kim message

## 2017-09-27 NOTE — Telephone Encounter (Signed)
They did not pt says she was told to follow up with our office. Made her an appt with you tomorrow. Pt just wanted to know should she stop the abx she is on since it will not help?

## 2017-09-27 NOTE — Telephone Encounter (Signed)
Pt has appt with you tomorrow but is taking macrolide abx . Pt was given this med at urgent care and was called by them to let her know this med will not work for bladder infection. Pt wants to know if she should continue to take this med until she comes in to see you tomorrow afternoon. Please advise Thanks [facility]

## 2017-09-27 NOTE — Telephone Encounter (Signed)
They should have switched her to a different antibiotic. Did they? If she was told this will not work then no use taking it.

## 2017-09-28 ENCOUNTER — Encounter: Payer: Self-pay | Admitting: Family Medicine

## 2017-09-28 ENCOUNTER — Ambulatory Visit (INDEPENDENT_AMBULATORY_CARE_PROVIDER_SITE_OTHER): Payer: Medicare Other | Admitting: Family Medicine

## 2017-09-28 VITALS — BP 120/82 | HR 72 | Temp 97.7°F

## 2017-09-28 DIAGNOSIS — N3001 Acute cystitis with hematuria: Secondary | ICD-10-CM

## 2017-09-28 LAB — POCT URINALYSIS DIP (PROADVANTAGE DEVICE)
BILIRUBIN UA: NEGATIVE mg/dL
Bilirubin, UA: NEGATIVE
Blood, UA: NEGATIVE
Glucose, UA: NEGATIVE mg/dL
Leukocytes, UA: NEGATIVE
Nitrite, UA: NEGATIVE
PH UA: 6 (ref 5.0–8.0)
PROTEIN UA: NEGATIVE mg/dL
SPECIFIC GRAVITY, URINE: 1.025
Urobilinogen, Ur: NEGATIVE

## 2017-09-28 MED ORDER — CIPROFLOXACIN HCL 250 MG PO TABS: 250.0000 mg | ORAL_TABLET | Freq: Two times a day (BID) | ORAL | 0 refills | Status: DC

## 2017-09-28 NOTE — Progress Notes (Signed)
Chief Complaint  Patient presents with  . bladder issues    bladder feels swollen, sweating. no other symptoms. was on macrobid but no relieve    Subjective:  Tracy Malone is a 84 y.o. female who is here to discuss recent UTI diagnosis. States she was diagnosed with a UTI with hematuria at an [facility] last weekend and was treated with Macrobid. States she received a phone call from them stating that the College would not cover the UTI and she should stop taking it and follow up with her PCP. She is requesting Cipro low dose. States she can take this safely. States Dr. Tomi Bamberger has prescribed this for her and she did fine.  She reports urinary symptoms have mainly resolved but she still feels like her bladder it "swollen". No longer having dysuria.  States she has occasional sweating.    Past Medical History:  Diagnosis Date  . Bell's palsy 1966   ? right side facial droop  . Carotid artery disease ([place]) 2010   on vascular screening;unchanged 2013.(could not tolerate simvastatin, no other statins tried)--<30% blockage bilat 07/2011  . Chronic abdominal pain   . Chronic fatigue and malaise   . Claustrophobia   . Depression    treated in the past for years;stopped in 2010 for a years  . Duodenal ulcer 1962   h/o  . Dysrhythmia    ocassional PVC's  . Fibromyalgia   . 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 GERD (gastroesophageal reflux disease)   . Glaucoma, narrow-angle    s/p laser surgery  . History of hiatal hernia    during endoscopy  . Hypothyroid 9/08  . IBS (irritable bowel syndrome)    Dr. Benson Norway  . Ocular migraine   . Osteoporosis 10/11   Dr.Hawkes  . Panic attack   . Recurrent UTI    has cystocele-Dr.Grewal  . Shingles 1999   h/o  . Superficial thrombophlebitis 03/2009   RLE  . Trochanteric bursitis 12/2008   bilateral    ROS as in subjective  Reviewed allergies, medications, past medical, surgical, and social history.     Objective: Vitals:   09/28/17 1353  BP: 120/82  Pulse: 72  Temp: 97.7 F (36.5 C)  SpO2: 95%    General appearance: alert, no distress, WD/WN, female Abdomen: +bs, soft, non tender, non distended, no masses, no hepatomegaly, no splenomegaly, no bruits Back: no CVA tenderness GU: Declined     Laboratory:  Urine dipstick: negative for all components.       Assessment: Acute cystitis with hematuria - Plan: POCT Urinalysis DIP (Proadvantage Device), Urine Culture, ciprofloxacin (CIPRO) 250 MG tablet    Plan: Attempted to get the notes and results from [facility] urgent care but they required the patient sign a medical release form.  Our fax machine is not currently working due to the phone lines being down all day.  Her urinalysis today is normal however she continues to have symptoms.  I will send the urine for culture. We will also attempt to get the records from urgent care. She reports the urgent care called her because Macrobid was not good coverage for her UTI, I will switch her to Cipro. Cipro is listed in her allergies however patient states she can take low-dose Cipro and has done fine on this recently.  Advised increased water intake, can use OTC Tylenol for pain.    Urine culture sent.  Call or return if  worse or not improving.

## 2017-09-29 LAB — URINE CULTURE

## 2017-10-01 ENCOUNTER — Telehealth: Payer: Self-pay | Admitting: Neurology

## 2017-10-01 ENCOUNTER — Encounter: Payer: Self-pay | Admitting: Family Medicine

## 2017-10-01 NOTE — Telephone Encounter (Signed)
Please advise 

## 2017-10-01 NOTE — Telephone Encounter (Signed)
LMOM at Dr. Mellissa Kohut office to check on glaucoma and safety of using Levodopa. Awaiting call back.

## 2017-10-01 NOTE — Telephone Encounter (Signed)
Tracy Malone, call her ophthalmologist and find out status and if I can use.  It does look like she has narrow angle glaucoma.  I have no other meds for tx of her PD and this patient is VERY nervous about all meds.

## 2017-10-01 NOTE — Telephone Encounter (Signed)
Pt called in regards to her prescription for carbadopa and the pharmacists was concerned about filling this because of her glaucoma, please call and advise whether it is safe or not

## 2017-10-03 ENCOUNTER — Ambulatory Visit (HOSPITAL_COMMUNITY): Payer: Self-pay | Admitting: Psychiatry

## 2017-10-03 NOTE — Progress Notes (Signed)
Chief Complaint  Patient presents with  . Follow-up    took macrobid but felt she didn't need cipro so did not take. I will check her urine sample, not having any urinary symptoms today. Also to have TSH rechecked today, no thyroid symptoms. Having L breast soreness x 2 weeks.     Patient presents for f/u on hypothyroidism. Her TSH/free T4 were borderline over-replaced,and due to her anxiety/tremor/fatigue, dose of thyroid was cut from 72mcg to 11mcg She is due for recheck of labs today. She can't tell if she feels any different (due to feeling badly, from depression, etc).  Hair is still falling out, but not as much. She also notices that her nails grew. Lab Results  Component Value Date   TSH 0.625 08/15/2017  free T4 1.58 (normal up to 1.77)  She was seen last week by [facility] for f/u on UTI from [facility]. She had been put on macrobid by [facility], but called stating the ABX needed to be changed by PCP.  Vickie put her on low dose cipro. Records were reviewed from [facility]: Urine dip showed mod leuks, mod blood. No culture was sent.  She had been treated with macrobid, took it for a full 7 days, and symptoms resolved.  She still had some swelling of her bladder when she saw Vickie last week (was still taking the macrobid). She finished the course of the macrobid, rather than starting the cipro, and symptoms did finally resolve.  She currently denies dysuria, urgency, frequency, abdominal pain or "bladder swelling" as she previously had.  She is complaining of left breast soreness x 2 weeks. It hasn't gotten worse.  She hasn't worn a bra in a while.  Uses that left arm to push herself up and out of bed. No change in activity, no injury/trauma.  Pain started prior to falling off an exercise ball.  (Slid off and landed on her tailbone).  Left chest/breast hurts in certain positions, and when she coughs at night. Pain 4.5-5/10 She was told by pharmacist that she shouldn't take tylenol with  wellbutrin.  She was started on wellbutrin by her psychiatrist, currently only taking 150mg , recently started.  She denies side effects.  She had emailed the end of February, with ongoing complaints of heartburn and reflux, along with abdominal swelling and bloating, and burning sensation. She was taking antacid with simethicone.  She also reported losing weight down to 102 lbs.   She hasn't taken any antacids in quite a while, and the bloating and pain resolved.  She has since seen Dr. Carles Collet in f/u of her tremor/Parkinson's.  They are waiting on hearing from her ophtho to determine whether or not she can take Levodopa.  PMH, PSH, SH reviewed  Outpatient Encounter Medications as of 10/04/2017  Medication Sig Note  . ALPRAZolam (XANAX) 0.5 MG tablet Use up to 3 times daily.  Warning: benzodiazepines increase the risk of falls and neurocognitive decline in the elderly   . b complex vitamins capsule Take 1 capsule by mouth as needed.  10/26/2016: daily  . buPROPion (WELLBUTRIN XL) 150 MG 24 hr tablet Take 1 tablet (150 mg total) by mouth every morning.   . Cholecalciferol (VITAMIN D) 2000 UNITS tablet Take 2,000 Units by mouth daily. 05/04/2015: Taking daily  . Probiotic Product (ALIGN PO) Take 1 capsule by mouth every other day.    . vitamin E 400 UNIT capsule Take 400 Units by mouth every Monday, Wednesday, and Friday. Reported on 09/06/2015   . [  DISCONTINUED] SYNTHROID 50 MCG tablet Take 25 mcg by mouth every morning.  08/27/2017: Started cutting in half since visit last week  . acetaminophen (TYLENOL) 650 MG CR tablet Take 650 mg by mouth as needed for pain. Reported on 09/06/2015   . Carbidopa-Levodopa ER (SINEMET CR) 25-100 MG tablet controlled release Take 1 tablet by mouth 3 (three) times daily. (Patient not taking: Reported on 10/04/2017)   . ciprofloxacin (CIPRO) 250 MG tablet Take 1 tablet (250 mg total) by mouth 2 (two) times daily. (Patient not taking: Reported on 10/04/2017)   . FLUoxetine  (PROZAC) 10 MG capsule Take 1 capsule (10 mg total) by mouth daily. Take in the morning with wellbutrin (Patient not taking: Reported on 09/28/2017)   . SYNTHROID 25 MCG tablet Take 1 tablet (25 mcg total) by mouth daily before breakfast.   . [DISCONTINUED] traZODone (DESYREL) 50 MG tablet 25-50 mg nightly    No facility-administered encounter medications on file as of 10/04/2017.    Allergies  Allergen Reactions  . Iodine Anaphylaxis    IV and topical forms.  Burnard Leigh [Hyoscyamine Sulfate]     Vision problems/pt has glaucoma  . Salmon [Fish Allergy] Hives and Shortness Of Breath  . Shellfish Allergy Anaphylaxis  . Remeron [Mirtazapine] Other (See Comments)    Cause blurred vision and red eyes, pt has glaucoma  . Aspirin Other (See Comments)    Sever stomach pain due to ulcer scaring.  . Ciprofloxacin Diarrhea  . Codeine Nausea And Vomiting  . Darvocet [Propoxyphene N-Acetaminophen] Nausea And Vomiting  . Demerol [Meperidine] Nausea Only  . Dexilant [Dexlansoprazole] Swelling    Redness, swelling and peeling of both feet.  . Diphedryl [Diphenhydramine] Other (See Comments)    Increased pulse/small amount ok  . Doxycycline Hyclate Other (See Comments)    GI intolerance.  Marland Kitchen Epinephrine Other (See Comments)    Breathing problems  . Erythromycin Other (See Comments)    GI intolerance.  Yvette Rack [Cyclobenzaprine] Other (See Comments)    Tingly/prickly sensation.  Marland Kitchen Keflex [Cephalexin] Hives  . Latex Other (See Comments)    Gloves ok.  Skin gets red from elastic in underwear and latex bandaides.  . Prednisone Other (See Comments)    Headache  . Pylera [Bis Subcit-Metronid-Tetracyc] Swelling    Tongue swelling. Face tingling  . Sulfa Antibiotics Other (See Comments)    Increased pulse, fainting, diarrhea, thrush  . Xylocaine [Lidocaine Hcl]     With epinephrine, given by dentist.  Speeded up heart rate and she passed out (occured twice, at dentist)  . Zoloft [Sertraline Hcl]  Swelling and Other (See Comments)    Migraine Swelling of tongue/lip (09/2012)  . Advil [Ibuprofen] Other (See Comments)    Motrin ok with a GI effect.  . Clarithromycin Rash    Started after completing 10 day course of 2000 mg /day   ROS:  +depression--crying, decreased appetite.  No SI/HI. No fever, chills, urinary complaints, bleeding, bruising, rash.  No change in bowels.  +left sided chest/breast pain per HPI.  See HPI.  PHYSICAL EXAM:  BP 120/68   Pulse 64   Temp 97.8 F (36.6 C) (Tympanic)   Ht 4\' 11"  (1.499 m)   Wt 103 lb 3.2 oz (46.8 kg)   BMI 20.84 kg/m   Wt Readings from Last 3 Encounters:  10/04/17 103 lb 3.2 oz (46.8 kg)  09/21/17 103 lb (46.7 kg)  08/27/17 104 lb 12.8 oz (47.5 kg)   Elderly, thin female, intermittently tearful,  in no distress.   HEENT conjunctiva and sclera are clear, EOMI Neck: no lymphadenopathy or thyromegaly, nontender Heart: regular rate and rhythm, no murmur Lungs: clear bilaterally Back: no cVA tenderness Abdomen: soft, nontender, no mass Chest: diffusely tender at the inferior portion of breast, and extending to the ribs below, extending laterally. She is very tender even to light touch over the breast tissue.  No masses, no axillary adenopathy, no other abnormality noted.  Extremities: no edema Psych: depressed. Normal hygiene and grooming, normal eye contact and speech. +tearful intermittently Neuro: alert and oriented, cranial nerves intact, normal gait Skin: no rashes or lesions.  Skin at chest/breast entirely normal as well.   Urine dip: 2+ leuks, trace blood.   ASSESSMENT/PLAN:  Hypothyroidism, unspecified type - dose decreased to 6mcg, due for recheck today. Euthyroid by history, nails improved - Plan: TSH, T4, Free, SYNTHROID 25 MCG tablet  Hiatal hernia - discussed what this was and meant.  Symptoms improved  Cystitis - clinically resolved. no antibiotics are indicated. Likely has had asymptomatic bacteriuria. Given lack  of symptoms, not sending any culture - Plan: POCT Urinalysis DIP (Proadvantage Device)  Severe recurrent major depression without psychotic features ([place]) - Reassured, to be pt with wellbutrin, only recently started, still low dose. To think of one thing that makes her smile every day. f/u with psych as planned  Chest wall pain - possible strain, vs related to fibromyalgia. Topical creams, tylenol, warm compresses prn. Reassured.   UTI--symptoms of frequency and dysuria resolved with 7d of macrobid.  No need to take the cipro. Repeat urine today not normal, but not sending for culture due to lack of symptoms.  TSH, free fT4  Send new rx for 25 mcg for when she runs out of 50, if dose isn't changed, to [facility].    There is no contraindication that I am aware of with taking acetaminophen and wellbutrin together.  You may use the tylenol (acetaminophen) as needed for pain, as well as topical medications (ie Icy Hot or Biofreeze), or a heating pad to the left chest.  I don't know that massage is a good idea for you--you are sensitive to just light touch, so massage might actually be fairly painful for you, and I'm not sure that it will help much.  Remember to try and think of something every day that makes you happy and/or makes you smile.  It might not be easy, but try. Do not lose hope in the new medication--you haven't been on it very long, and it is low dose.  The good news is that it isn't causing you any side effects, so be patient.

## 2017-10-03 NOTE — Telephone Encounter (Signed)
Spoke with Dr. Mellissa Kohut office to see the status. They state they will call me back.

## 2017-10-04 ENCOUNTER — Ambulatory Visit (INDEPENDENT_AMBULATORY_CARE_PROVIDER_SITE_OTHER): Payer: Medicare Other | Admitting: Family Medicine

## 2017-10-04 ENCOUNTER — Telehealth: Payer: Self-pay

## 2017-10-04 ENCOUNTER — Encounter: Payer: Self-pay | Admitting: Family Medicine

## 2017-10-04 VITALS — BP 120/68 | HR 64 | Temp 97.8°F | Ht 59.0 in | Wt 103.2 lb

## 2017-10-04 DIAGNOSIS — N3090 Cystitis, unspecified without hematuria: Secondary | ICD-10-CM

## 2017-10-04 DIAGNOSIS — F332 Major depressive disorder, recurrent severe without psychotic features: Secondary | ICD-10-CM | POA: Diagnosis not present

## 2017-10-04 DIAGNOSIS — E039 Hypothyroidism, unspecified: Secondary | ICD-10-CM

## 2017-10-04 DIAGNOSIS — R0789 Other chest pain: Secondary | ICD-10-CM

## 2017-10-04 DIAGNOSIS — K449 Diaphragmatic hernia without obstruction or gangrene: Secondary | ICD-10-CM | POA: Diagnosis not present

## 2017-10-04 LAB — POCT URINALYSIS DIP (PROADVANTAGE DEVICE)
BILIRUBIN UA: NEGATIVE mg/dL
Bilirubin, UA: NEGATIVE
Glucose, UA: NEGATIVE mg/dL
NITRITE UA: NEGATIVE
PH UA: 6 (ref 5.0–8.0)
PROTEIN UA: NEGATIVE mg/dL
Specific Gravity, Urine: 1.01
Urobilinogen, Ur: NEGATIVE

## 2017-10-04 NOTE — Telephone Encounter (Signed)
Patient's husband called to see if you have spoken with Dr. Mellissa Kohut office? Thanks

## 2017-10-04 NOTE — Telephone Encounter (Signed)
Patient left message on the voice mail stating she is return a call

## 2017-10-04 NOTE — Telephone Encounter (Signed)
Patient said it was Urgent to speak with you regarding the medication.

## 2017-10-04 NOTE — Telephone Encounter (Signed)
Called pt because it seems as if she was checking to see if we had gotten some records from another doctor in 2014. Pt cell phone recording was going in out . Could not lvm because of it not being set up. [place]

## 2017-10-04 NOTE — Telephone Encounter (Signed)
Spoke with patient - made her aware I have still not heard back from Dr. Mellissa Kohut office.  She has called them twice as well.

## 2017-10-04 NOTE — Patient Instructions (Signed)
  There is no contraindication that I am aware of with taking acetaminophen and wellbutrin together.  You may use the tylenol (acetaminophen) as needed for pain, as well as topical medications (ie Icy Hot or Biofreeze), or a heating pad to the left chest.  I don't know that massage is a good idea for you--you are sensitive to just light touch, so massage might actually be fairly painful for you, and I'm not sure that it will help much.  Remember to try and think of something every day that makes you happy and/or makes you smile.  It might not be easy, but try. Do not lose hope in the new medication--you haven't been on it very long, and it is low dose.  The good news is that it isn't causing you any side effects, so be patient.

## 2017-10-04 NOTE — Telephone Encounter (Signed)
Left message on machine for patient to call back.

## 2017-10-05 LAB — TSH: TSH: 1.81 u[IU]/mL (ref 0.450–4.500)

## 2017-10-05 LAB — T4, FREE: FREE T4: 1.53 ng/dL (ref 0.82–1.77)

## 2017-10-05 MED ORDER — SYNTHROID 25 MCG PO TABS: 25.0000 ug | ORAL_TABLET | Freq: Every day | ORAL | 5 refills | Status: DC

## 2017-10-05 NOTE — Telephone Encounter (Signed)
Patient called into after hours nursing line and left message that she finally heard back from Dr. Mellissa Kohut office that Carbidopa Levodopa is safe for her to use. She will start medication.   Dr. Carles Collet Juluis Rainier.

## 2017-10-10 ENCOUNTER — Telehealth: Payer: Self-pay | Admitting: Neurology

## 2017-10-10 ENCOUNTER — Encounter: Payer: Self-pay | Admitting: Family Medicine

## 2017-10-10 ENCOUNTER — Telehealth: Payer: Self-pay | Admitting: Family Medicine

## 2017-10-10 NOTE — Telephone Encounter (Signed)
Sent patient a message through My Chart.

## 2017-10-10 NOTE — Telephone Encounter (Signed)
Spoke with patient. She started Carbidopa Levodopa on Saturday. She is on CR version and only taking one tablet in the morning. The last two nights she is having chills at bedtime. She states "chills" is a side effect of Levodopa.   I did let her know that this more than likely is not coming from Levodopa. She is taking medication in the morning and even though it's CR version it would not still be in her system at bedtime. Also explained that everything listed as side effects is not always an accurate representation and should pay attention to more common side effects.   I did let her know I would make Dr. Carles Collet aware of this and call back if she thinks differently. She will continue medication and call PCP if chills persist.

## 2017-10-10 NOTE — Telephone Encounter (Signed)
Pt called and stated that she is getting frequent chills at night. She states she goes to bed fine but every time her husband moves she gets chills. She states it last about 2 minutes or so then goes away but returns if he moves again. She stated she called Dr. Doristine Devoid office and they stated for her to call pcp. Please advise pt at [phone] and she uses [facility] on [address].

## 2017-10-10 NOTE — Telephone Encounter (Signed)
1.     Name of medication: Carbadopa  2.     How are you currently taking this medication (dosage and times per day)?   3.     Are you having a reaction (difficulty breathing--STAT)?   4.     What is your medication issue? Pt is having night sweats and chills

## 2017-10-11 ENCOUNTER — Encounter: Payer: Self-pay | Admitting: Family Medicine

## 2017-10-11 NOTE — Telephone Encounter (Signed)
I had a very long discussion last visit about her intolerances to medication and pts family and I believe that these are mostly anxiety.  She can d/c if she wishes but I have no other options for her

## 2017-10-15 ENCOUNTER — Encounter: Payer: Self-pay | Admitting: Neurology

## 2017-10-22 DIAGNOSIS — E039 Hypothyroidism, unspecified: Secondary | ICD-10-CM | POA: Diagnosis not present

## 2017-10-23 DIAGNOSIS — M25511 Pain in right shoulder: Secondary | ICD-10-CM | POA: Diagnosis not present

## 2017-10-23 DIAGNOSIS — M7542 Impingement syndrome of left shoulder: Secondary | ICD-10-CM | POA: Diagnosis not present

## 2017-10-30 ENCOUNTER — Other Ambulatory Visit: Payer: Self-pay | Admitting: Obstetrics and Gynecology

## 2017-10-30 DIAGNOSIS — M940 Chondrocostal junction syndrome [Tietze]: Secondary | ICD-10-CM | POA: Diagnosis not present

## 2017-10-30 DIAGNOSIS — N644 Mastodynia: Secondary | ICD-10-CM | POA: Diagnosis not present

## 2017-10-31 ENCOUNTER — Other Ambulatory Visit: Payer: Self-pay

## 2017-10-31 DIAGNOSIS — M25561 Pain in right knee: Secondary | ICD-10-CM | POA: Diagnosis not present

## 2017-10-31 DIAGNOSIS — M1711 Unilateral primary osteoarthritis, right knee: Secondary | ICD-10-CM | POA: Diagnosis not present

## 2017-11-02 ENCOUNTER — Telehealth: Payer: Self-pay | Admitting: Neurology

## 2017-11-02 NOTE — Telephone Encounter (Signed)
Called Pt, LMOVM advising

## 2017-11-02 NOTE — Telephone Encounter (Signed)
Patient left on VM that she is having some problems with her carbidopa levodopa such a as headache not sleeping  Sweating and some other things that she did not want to leave on the VM she has called twice and left two VM this morning stating that is was very important she speaks to someone

## 2017-11-02 NOTE — Telephone Encounter (Signed)
Called and spoke with Pt. She wants to remain on 1 tablet QD. She will call her PCP to make an appt to be evaluated for her symptoms.

## 2017-11-02 NOTE — Telephone Encounter (Signed)
Called and spoke with Pt. She states she started Carbidopa-Levodopa 4 weeks ago, taking 1 tablet QD.  On 10/28/17, she started 1 BID 9:30a, 3:30p. She mentioned she was to take another tablet at 9:30p, however, she has not been taking that dose.  On Wednesday (10/31/17) she developed a severe headache that worsened throughout the night, she was unable to sleep. Headache resolved by 10a. The headache returned last night around 10p, was worse than the previous night. She has been experiencing chills and night sweats, along with abdominal pain. She has noticed jerking in her R. Arm that she states was not there previously and red spots on her hands, though mostly on the R. She has a lump on her upper R. arm and wrist. She has also been more nervous and emotional. She is questioning if she should d/c.

## 2017-11-02 NOTE — Telephone Encounter (Signed)
Just make her aware carbidopa/levodopa won't work if only takes once per day.  Supposed to be working to tid.

## 2017-11-02 NOTE — Telephone Encounter (Signed)
Those are no side effects of levodopa.  She should be evaluated by PCP or ER for those things.  She can stop levodopa if she would like but as I have told her many times previously, I have no other medication options for her.  She does need to get assist for her anxiety.

## 2017-11-05 ENCOUNTER — Ambulatory Visit
Admission: RE | Admit: 2017-11-05 | Discharge: 2017-11-05 | Disposition: A | Discharge status: Home / Self Care | Payer: Medicare Other | Source: Ambulatory Visit | Attending: Obstetrics and Gynecology | Admitting: Obstetrics and Gynecology

## 2017-11-05 ENCOUNTER — Other Ambulatory Visit: Payer: Self-pay | Admitting: Obstetrics and Gynecology

## 2017-11-05 ENCOUNTER — Ambulatory Visit: Payer: Self-pay

## 2017-11-05 ENCOUNTER — Encounter: Payer: Self-pay | Admitting: Family Medicine

## 2017-11-05 ENCOUNTER — Ambulatory Visit (INDEPENDENT_AMBULATORY_CARE_PROVIDER_SITE_OTHER): Payer: Medicare Other | Admitting: Family Medicine

## 2017-11-05 VITALS — BP 134/70 | HR 80 | Temp 98.0°F | Ht 59.0 in | Wt 106.0 lb

## 2017-11-05 DIAGNOSIS — R0781 Pleurodynia: Secondary | ICD-10-CM

## 2017-11-05 DIAGNOSIS — F332 Major depressive disorder, recurrent severe without psychotic features: Secondary | ICD-10-CM | POA: Diagnosis not present

## 2017-11-05 DIAGNOSIS — D1721 Benign lipomatous neoplasm of skin and subcutaneous tissue of right arm: Secondary | ICD-10-CM | POA: Diagnosis not present

## 2017-11-05 DIAGNOSIS — R928 Other abnormal and inconclusive findings on diagnostic imaging of breast: Secondary | ICD-10-CM | POA: Diagnosis not present

## 2017-11-05 DIAGNOSIS — R51 Headache: Secondary | ICD-10-CM | POA: Diagnosis not present

## 2017-11-05 DIAGNOSIS — R0789 Other chest pain: Secondary | ICD-10-CM | POA: Diagnosis not present

## 2017-11-05 DIAGNOSIS — G20 Parkinson's disease: Secondary | ICD-10-CM | POA: Diagnosis not present

## 2017-11-05 DIAGNOSIS — R519 Headache, unspecified: Secondary | ICD-10-CM

## 2017-11-05 DIAGNOSIS — R05 Cough: Secondary | ICD-10-CM | POA: Diagnosis not present

## 2017-11-05 DIAGNOSIS — N644 Mastodynia: Secondary | ICD-10-CM

## 2017-11-05 NOTE — Progress Notes (Signed)
Chief Complaint  Patient presents with  . Allergic Reaction    severe HA, thought she was having a stroke. Believes it's from her sinemet. Was taking 2 now she is taking only 1 and is fine. Dr. Carles Collet said this is not possible.    Patient presents with complaint of headaches which she relates to the new medication from Dr. Carles Collet, carbidopa/levodopa.  She is taking 1 levodopa daily. She took it once daily x 4 weeks and tolerated it fine; states her daughter thought it helped--acted/looked/seemed to feel better, not any significant change in the tremor noted, per pt. She then increased it to 2x/d (6 hours apart) 1 week ago, 4/8.  She was fine on 4/8, but by 4/10th at 8pm she had a "terrible, severe headache", on either side of her head (both parieto-occipital area).  It kept her up all night, tylenol didn't help. It was gone by 11am the next day.  Had the same HA develop the next night.  She went back to just taking the morning dose, and hasn't had any further headaches. She did check BP's when feeling bad, had some high BP's, but a lot of fluctuations. Nothing significantly high.  She had no associated symptoms with the headaches (vision, nausea, other neuro complaints).  She remains on Wellbutrin. She has an upcoming appointment with psychologist and she is asking for my opinion on this.  She admits to crying a lot, but not necessarily due to feeling hopeless/helpless, just cries easily.  She thinks this bothers her husband.  She has been having some left sided chest pain (for about 5 weeks), that she thinks is contributed by her pushing herself out of bed with her left arm. She saw Dr. Helane Rima recently, who ordered mammogram and chest x-ray, both of which she had this morning. She had gotten the mammo results and was relieved, asking about CXR results.  She has noted some bumps on the right upper. She reports having known lipomas on the front of that arm, one near the armpit for 40 years, lower on the arm  for at least 4-5 years. She does think they became more noticeable with weight loss. The new lumps are smaller, not painful, not associated with any injury.  PMH, PSH, SH reviewed  Outpatient Encounter Medications as of 11/05/2017  Medication Sig Note  . ALPRAZolam (XANAX) 0.5 MG tablet Use up to 3 times daily.  Warning: benzodiazepines increase the risk of falls and neurocognitive decline in the elderly   . b complex vitamins capsule Take 1 capsule by mouth as needed.  10/26/2016: daily  . buPROPion (WELLBUTRIN XL) 150 MG 24 hr tablet Take 1 tablet (150 mg total) by mouth every morning.   . Carbidopa-Levodopa ER (SINEMET CR) 25-100 MG tablet controlled release Take 1 tablet by mouth 3 (three) times daily. 11/05/2017: 1 daily  . Cholecalciferol (VITAMIN D) 2000 UNITS tablet Take 2,000 Units by mouth daily. 05/04/2015: Taking daily  . Probiotic Product (ALIGN PO) Take 1 capsule by mouth every other day.    Marland Kitchen SYNTHROID 25 MCG tablet Take 1 tablet (25 mcg total) by mouth daily before breakfast.   . vitamin E 400 UNIT capsule Take 400 Units by mouth every Monday, Wednesday, and Friday. Reported on 09/06/2015   . acetaminophen (TYLENOL) 650 MG CR tablet Take 650 mg by mouth as needed for pain. Reported on 09/06/2015   . ciclopirox (PENLAC) 8 % solution    . [DISCONTINUED] buPROPion (WELLBUTRIN XL) 150 MG 24 hr  tablet Wellbutrin XL 150 mg 24 hr tablet, extended release  Take 1 tablet every day by oral route.   . [DISCONTINUED] ciprofloxacin (CIPRO) 250 MG tablet Take 1 tablet (250 mg total) by mouth 2 (two) times daily. (Patient not taking: Reported on 10/04/2017)   . [DISCONTINUED] FLUoxetine (PROZAC) 10 MG capsule Take 1 capsule (10 mg total) by mouth daily. Take in the morning with wellbutrin (Patient not taking: Reported on 09/28/2017)    No facility-administered encounter medications on file as of 11/05/2017.    Allergies  Allergen Reactions  . Iodine Anaphylaxis    IV and topical forms.  Burnard Leigh  [Hyoscyamine Sulfate]     Vision problems/pt has glaucoma  . Salmon [Fish Allergy] Hives and Shortness Of Breath  . Shellfish Allergy Anaphylaxis  . Remeron [Mirtazapine] Other (See Comments)    Cause blurred vision and red eyes, pt has glaucoma  . Aspirin Other (See Comments)    Sever stomach pain due to ulcer scaring.  . Ciprofloxacin Diarrhea  . Codeine Nausea And Vomiting  . Darvocet [Propoxyphene N-Acetaminophen] Nausea And Vomiting  . Demerol [Meperidine] Nausea Only  . Dexilant [Dexlansoprazole] Swelling    Redness, swelling and peeling of both feet.  . Diphedryl [Diphenhydramine] Other (See Comments)    Increased pulse/small amount ok  . Doxycycline Hyclate Other (See Comments)    GI intolerance.  Marland Kitchen Epinephrine Other (See Comments)    Breathing problems  . Erythromycin Other (See Comments)    GI intolerance.  Yvette Rack [Cyclobenzaprine] Other (See Comments)    Tingly/prickly sensation.  Marland Kitchen Keflex [Cephalexin] Hives  . Latex Other (See Comments)    Gloves ok.  Skin gets red from elastic in underwear and latex bandaides.  . Prednisone Other (See Comments)    Headache  . Pylera [Bis Subcit-Metronid-Tetracyc] Swelling    Tongue swelling. Face tingling  . Sulfa Antibiotics Other (See Comments)    Increased pulse, fainting, diarrhea, thrush  . Xylocaine [Lidocaine Hcl]     With epinephrine, given by dentist.  Speeded up heart rate and she passed out (occured twice, at dentist)  . Zoloft [Sertraline Hcl] Swelling and Other (See Comments)    Migraine Swelling of tongue/lip (09/2012)  . Advil [Ibuprofen] Other (See Comments)    Motrin ok with a GI effect.  . Clarithromycin Rash    Started after completing 10 day course of 2000 mg /day   ROS:  No fever, chills, URI symptoms.  Appetite okay, no signiicant weight changes.  +tremor RUE.  Moods somewhat improved, though still depressed. Left sided chest wall pain per HPI; no exertional chest pain. She had a fall and has had some  persistent pain at her tailbone, which limits her ability to ride the exercise bike. She has been walking some. No bleeding, bruising, rash or any other complaints, except as noted in HPI  PHYSICAL EXAM:  BP 134/70   Pulse 80   Temp 98 F (36.7 C) (Tympanic)   Ht 4\' 11"  (1.499 m)   Wt 106 lb (48.1 kg)   BMI 21.41 kg/m  Wt Readings from Last 3 Encounters:  11/05/17 106 lb (48.1 kg)  10/04/17 103 lb 3.2 oz (46.8 kg)  09/21/17 103 lb (46.7 kg)   Well appearing, pleasant female, who is not accompanied by her husband in exam room today.  She appears to be in better spirits, with full range of affect, smiling.  Not tearful. HEENT: conjunctiva and sclera are clear, EOMI Neck: no lymphadenopathy or mass  Chest: tender at costochondral junction on left, at level of lower breast Heart: regular rate and rhythm Lungs: clear Head: nontender Right upper arm--along the lateral aspect are 1.5 cm or less soft, subcutaneous masses. No overlying skin abnormality.  Further superiorly and anteriorly on the right arm are much large subcutaneous masses ("those are my lipomas").  Chest x-ray: normal   ASSESSMENT/PLAN:  Lipoma of right upper extremity - reassured. to return if increasing in size, painful or other new concerns  Chest wall pain - left -sided costochondritis. Reassured about normal CXR results  Acute nonintractable headache, unspecified headache type - resolved; consider possibility is side effect from sinimet; no further HA's since decreased dose back to 1/d  Parkinson disease ([place]) - discussed trial of slower titration up to BID dose as per Dr. Dalia Heading pm dose every other day at first, then re-try BID. To discuss concerns/SE  with Dr. Carles Collet  Severe recurrent major depression without psychotic features ([place]) - improved some on wellbutrin, encouraged her to f/u with counseling as planned   Consider titrating up the Carbidopa-Levodopa a little more slowly--sounds like you did fine taking  it once daily, but on the third day of taking it 2x/day you developed headaches.  Try taking the 2nd dose every other day for a few weeks, and if no headaches, then try again to take it daily.  Feel free to run this by Dr. Carles Collet as well.  Your chest x-ray and mammogram were normal. Costochondritis is not something you can see on an x-ray. The fact that the x-ray didn't show any abnormalities is very reassuring that nothing more serious is causing your pain.   The small lumps that you have noticed on the right upper arm are consistent with small lipomas, the same as the other, larger lumps on that arm.  They have the same exact consistency.  If you noticed any rapid increase in size or tenderness, please let us know, otherwise these will remain there (and possibly very slowly increase in size), as part of the normal course of a lipoma.

## 2017-11-05 NOTE — Patient Instructions (Signed)
  Consider titrating up the Carbidopa-Levodopa a little more slowly--sounds like you did fine taking it once daily, but on the third day of taking it 2x/day you developed headaches.  Try taking the 2nd dose every other day for a few weeks, and if no headaches, then try again to take it daily.  Feel free to run this by Dr. Carles Collet as well.  Your chest x-ray and mammogram were normal. Costochondritis is not something you can see on an x-ray. The fact that the x-ray didn't show any abnormalities is very reassuring that nothing more serious is causing your pain.   The small lumps that you have noticed on the right upper arm are consistent with small lipomas, the same as the other, larger lumps on that arm.  They have the same exact consistency.  If you noticed any rapid increase in size or tenderness, please let us know, otherwise these will remain there (and possibly very slowly increase in size), as part of the normal course of a lipoma.  Lipoma A lipoma is a noncancerous (benign) tumor that is made up of fat cells. This is a very common type of soft-tissue growth. Lipomas are usually found under the skin (subcutaneous). They may occur in any tissue of the body that contains fat. Common areas for lipomas to appear include the back, shoulders, buttocks, and thighs. Lipomas grow slowly, and they are usually painless. Most lipomas do not cause problems and do not require treatment. What are the causes? The cause of this condition is not known. What increases the risk? This condition is more likely to develop in:  People who are 78-64 years old.  People who have a family history of lipomas.  What are the signs or symptoms? A lipoma usually appears as a small, round bump under the skin. It may feel soft or rubbery, but the firmness can vary. Most lipomas are not painful. However, a lipoma may become painful if it is located in an area where it pushes on nerves. How is this diagnosed? A lipoma can usually  be diagnosed with a physical exam. You may also have tests to confirm the diagnosis and to rule out other conditions. Tests may include:  Imaging tests, such as a CT scan or MRI.  Removal of a tissue sample to be looked at under a microscope (biopsy).  How is this treated? Treatment is not needed for small lipomas that are not causing problems. If a lipoma continues to get bigger or it causes problems, removal is often the best option. Lipomas can also be removed to improve appearance. Removal of a lipoma is usually done with a surgery in which the fatty cells and the surrounding capsule are removed. Most often, a medicine that numbs the area (local anesthetic) is used for this procedure. Follow these instructions at home:  Keep all follow-up visits as directed by your health care provider. This is important. Contact a health care provider if:  Your lipoma becomes larger or hard.  Your lipoma becomes painful, red, or increasingly swollen. These could be signs of infection or a more serious condition. This information is not intended to replace advice given to you by your health care provider. Make sure you discuss any questions you have with your health care provider. Document Released: 06/30/2002 Document Revised: 12/16/2015 Document Reviewed: 07/06/2014 Elsevier Interactive Patient Education  [employer].

## 2017-11-07 ENCOUNTER — Ambulatory Visit: Payer: Medicare Other | Admitting: Family Medicine

## 2017-11-15 ENCOUNTER — Ambulatory Visit (INDEPENDENT_AMBULATORY_CARE_PROVIDER_SITE_OTHER): Payer: Medicare Other | Admitting: Psychiatry

## 2017-11-15 ENCOUNTER — Encounter (HOSPITAL_COMMUNITY): Payer: Self-pay | Admitting: Psychiatry

## 2017-11-15 VITALS — BP 117/68 | HR 76 | Ht 59.0 in | Wt 104.0 lb

## 2017-11-15 DIAGNOSIS — G20 Parkinson's disease: Secondary | ICD-10-CM | POA: Diagnosis not present

## 2017-11-15 DIAGNOSIS — F332 Major depressive disorder, recurrent severe without psychotic features: Secondary | ICD-10-CM

## 2017-11-15 DIAGNOSIS — Z79899 Other long term (current) drug therapy: Secondary | ICD-10-CM

## 2017-11-15 DIAGNOSIS — R251 Tremor, unspecified: Secondary | ICD-10-CM

## 2017-11-15 DIAGNOSIS — F411 Generalized anxiety disorder: Secondary | ICD-10-CM | POA: Diagnosis not present

## 2017-11-15 MED ORDER — BUPROPION HCL ER (XL) 150 MG PO TB24: 150.0000 mg | ORAL_TABLET | ORAL | 1 refills | Status: DC

## 2017-11-15 MED ORDER — ALPRAZOLAM 0.5 MG PO TABS: ORAL_TABLET | ORAL | 2 refills | Status: DC

## 2017-11-15 NOTE — Progress Notes (Signed)
[facility] MD/PA/NP OP Progress Note  11/15/2017 10:21 AM BANA BORGMEYER  MRN:  [id number]  Chief Complaint: Doing a little better HPI: Tracy Malone presents for follow-up visit, has been taking Wellbutrin consistently, and gradually titrating Sinemet.  No acute safety issues.  She continues on Xanax 0.5 mg 3 times a day as needed.  She understands that this does contribute to the risk of falls.  Spent time with her encouraging her participation in individual therapy, which she will be starting today.  Disclose that Probation officer is leaving office at the end of August, and we discussed the plan of transition to Dr. Adele Schilder for geriatric psychiatry specialty care.  She was agreeable to this, no other acute concerns or questions at this time/  Visit Diagnosis:    ICD-10-CM   1. Major depressive disorder, recurrent, severe without psychotic features ([place]) F33.2 buPROPion (WELLBUTRIN XL) 150 MG 24 hr tablet    ALPRAZolam (XANAX) 0.5 MG tablet  2. Parkinson's disease ([place]) G20   3. Tremor R25.1     Past Psychiatric History: See intake H&P for full details. Reviewed, with no updates at this time.   Past Medical History:  Past Medical History:  Diagnosis Date  . Bell's palsy 1966   ? right side facial droop  . Carotid artery disease ([place]) 2010   on vascular screening;unchanged 2013.(could not tolerate simvastatin, no other statins tried)--<30% blockage bilat 07/2011  . Chronic abdominal pain   . Chronic fatigue and malaise   . Claustrophobia   . Depression    treated in the past for years;stopped in 2010 for a years  . Duodenal ulcer 1962   h/o  . Dysrhythmia    ocassional PVC's  . Fibromyalgia   . 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 GERD (gastroesophageal reflux disease)   . Glaucoma, narrow-angle    s/p laser surgery  . History of hiatal hernia    during endoscopy  . Hypothyroid 9/08  . IBS (irritable bowel syndrome)    Dr. Benson Norway  . Ocular  migraine   . Osteoporosis 10/11   Dr.Hawkes  . Panic attack   . Recurrent UTI    has cystocele-Dr.Grewal  . Shingles 1999   h/o  . Superficial thrombophlebitis 03/2009   RLE  . Trochanteric bursitis 12/2008   bilateral    Past Surgical History:  Procedure Laterality Date  . ABDOMINAL HYSTERECTOMY    . CATARACT EXTRACTION, BILATERAL  1995, 1996  . Flexible sigmoidoscopy    . THYROIDECTOMY, PARTIAL  09/2005   L nodule; Dr. Harlow Asa  . TONSILLECTOMY  1946  . UPPER GI ENDOSCOPY  06/27/12  . VAGINAL HYSTERECTOMY  1971   and bladder repair.  Still has ovaries    Family Psychiatric History: See intake H&P for full details. Reviewed, with no updates at this time.   Family History:  Family History  Problem Relation Age of Onset  . Heart disease Mother   . Hypertension Mother   . Hypertension Sister   . HIV Son   . Heart disease Brother   . Lung cancer Brother        lung  . Diabetes Maternal Grandfather     Social History:  Social History   Socioeconomic History  . Marital status: Married    Spouse name: Not on file  . Number of children: 2  . Years of education: Not on file  . Highest education level: Not on file  Occupational History  . Occupation: retired (school system)  Social Needs  . Financial resource strain: Not on file  . Food insecurity:    Worry: Not on file    Inability: Not on file  . Transportation needs:    Medical: Not on file    Non-medical: Not on file  Tobacco Use  . Smoking status: Never Smoker  . Smokeless tobacco: Never Used  Substance and Sexual Activity  . Alcohol use: No    Alcohol/week: 0.0 oz  . Drug use: No  . Sexual activity: Never  Lifestyle  . Physical activity:    Days per week: Not on file    Minutes per session: Not on file  . Stress: Not on file  Relationships  . Social connections:    Talks on phone: Not on file    Gets together: Not on file    Attends religious service: Not on file    Active member of club or  organization: Not on file    Attends meetings of clubs or organizations: Not on file    Relationship status: Not on file  Other Topics Concern  . Not on file  Social History Narrative   Married.  Son lives in [place]; Daughter [name] lives in [place]; 2 grandchildren    Allergies:  Allergies  Allergen Reactions  . Iodine Anaphylaxis    IV and topical forms.  Burnard Leigh [Hyoscyamine Sulfate]     Vision problems/pt has glaucoma  . [place] [Fish Allergy] Hives and Shortness Of Breath  . Shellfish Allergy Anaphylaxis  . Remeron [Mirtazapine] Other (See Comments)    Cause blurred vision and red eyes, pt has glaucoma  . Aspirin Other (See Comments)    Sever stomach pain due to ulcer scaring.  . Ciprofloxacin Diarrhea  . Codeine Nausea And Vomiting  . Darvocet [Propoxyphene N-Acetaminophen] Nausea And Vomiting  . Demerol [Meperidine] Nausea Only  . Dexilant [Dexlansoprazole] Swelling    Redness, swelling and peeling of both feet.  . Diphedryl [Diphenhydramine] Other (See Comments)    Increased pulse/small amount ok  . Doxycycline Hyclate Other (See Comments)    GI intolerance.  Marland Kitchen Epinephrine Other (See Comments)    Breathing problems  . Erythromycin Other (See Comments)    GI intolerance.  Yvette Rack [Cyclobenzaprine] Other (See Comments)    Tingly/prickly sensation.  Marland Kitchen Keflex [Cephalexin] Hives  . Latex Other (See Comments)    Gloves ok.  Skin gets red from elastic in underwear and latex bandaides.  . Prednisone Other (See Comments)    Headache  . Pylera [Bis Subcit-Metronid-Tetracyc] Swelling    Tongue swelling. Face tingling  . Sulfa Antibiotics Other (See Comments)    Increased pulse, fainting, diarrhea, thrush  . Xylocaine [Lidocaine Hcl]     With epinephrine, given by dentist.  Speeded up heart rate and she passed out (occured twice, at dentist)  . Zoloft [Sertraline Hcl] Swelling and Other (See Comments)    Migraine Swelling of tongue/lip (09/2012)  . Advil [Ibuprofen]  Other (See Comments)    Motrin ok with a GI effect.  . Clarithromycin Rash    Started after completing 10 day course of 2000 mg /day    Metabolic Disorder Labs: No results found for: HGBA1C, MPG No results found for: PROLACTIN Lab Results  Component Value Date   CHOL 218 (H) 11/23/2016   TRIG 123 11/23/2016   HDL 78 11/23/2016   CHOLHDL 2.8 11/23/2016   VLDL 25 11/23/2016   LDLCALC 115 (H) 11/23/2016  Brigantine 121 05/04/2015   Lab Results  Component Value Date   TSH 1.810 10/04/2017   TSH 0.625 08/15/2017    Therapeutic Level Labs: No results found for: LITHIUM No results found for: VALPROATE No components found for:  CBMZ  Current Medications: Current Outpatient Medications  Medication Sig Dispense Refill  . acetaminophen (TYLENOL) 650 MG CR tablet Take 650 mg by mouth as needed for pain. Reported on 09/06/2015    . ALPRAZolam (XANAX) 0.5 MG tablet Use up to 3 times daily.  Warning: benzodiazepines increase the risk of falls and neurocognitive decline in the elderly 90 tablet 2  . b complex vitamins capsule Take 1 capsule by mouth as needed.     Marland Kitchen buPROPion (WELLBUTRIN XL) 150 MG 24 hr tablet Take 1 tablet (150 mg total) by mouth every morning. 90 tablet 1  . Carbidopa-Levodopa ER (SINEMET CR) 25-100 MG tablet controlled release Take 1 tablet by mouth 3 (three) times daily. 90 tablet 2  . Cholecalciferol (VITAMIN D) 2000 UNITS tablet Take 2,000 Units by mouth daily.    . ciclopirox (PENLAC) 8 % solution   2  . Probiotic Product (ALIGN PO) Take 1 capsule by mouth every other day.     Marland Kitchen SYNTHROID 25 MCG tablet Take 1 tablet (25 mcg total) by mouth daily before breakfast. 30 tablet 5  . vitamin E 400 UNIT capsule Take 400 Units by mouth every Monday, Wednesday, and Friday. Reported on 09/06/2015     No current facility-administered medications for this visit.      Musculoskeletal: Strength & Muscle Tone: abnormal Gait & Station: shuffle Patient leans:  Front  Psychiatric Specialty Exam: ROS  Blood pressure 117/68, pulse 76, height 4' 11"  (1.499 m), weight 104 lb (47.2 kg), SpO2 99 %.Body mass index is 21.01 kg/m.  General Appearance: Casual and Fairly Groomed  Eye Contact:  Good  Speech:  Normal Rate  Volume:  soft  Mood:  okay, a little irritable  Affect:  Congruent and Flat  Thought Process:  Goal Directed and Descriptions of Associations: Intact  Orientation:  Full (Time, Place, and Person)  Thought Content: Logical   Suicidal Thoughts:  No  Homicidal Thoughts:  No  Memory:  Immediate;   Fair  Judgement:  Good  Insight:  Fair  Psychomotor Activity:  Psychomotor Retardation and Tremor  Concentration:  Concentration: Fair  Recall:  [place] of Knowledge: Fair  Language: Fair  Akathisia:  Negative  Handed:  Right  AIMS (if indicated): not done  Assets:  Communication Skills Desire for Improvement Financial Resources/Insurance Housing Social Support Transportation  ADL's:  Intact  Cognition: WNL  Sleep:  Good   Screenings: ECT-MADRS     ECT Treatment from 05/07/2017 in [facility]  MADRS Total Score  31    Mini-Mental     ECT Treatment from 05/07/2017 in [facility]  Total Score (max 30 points )  30    PHQ2-9     Office Visit from 08/27/2017 in [place] Office Visit from 09/26/2016 in [facility] for Infectious Disease Office Visit from 05/04/2015 in [facility] Visit from 03/04/2015 in [facility] for Infectious Disease Office Visit from 01/27/2015 in [facility] for [facility]ious Disease  PHQ-2 Total Score  4  0  2  0  0       Assessment and Plan:  CHARLINA DWIGHT has had some  interval improvement with Wellbutrin in terms of depression and mood, energy, and participation in family activities.  She continues to struggle with physical side effects of titration of Sinemet and  is working with neurology on this.  No acute safety issues or suicidality.  Has excellent family support.  Discussed writer's departure from this clinic at the end of August and transition of care plans.  1. Major depressive disorder, recurrent, severe without psychotic features ([place])   2. Parkinson's disease ([place])   3. Tremor     Status of current problems: gradually improving  Labs Ordered: No orders of the defined types were placed in this encounter.   Labs Reviewed: n/a  Collateral Obtained/Records Reviewed: family present to corroborate improvements gradually from last visit  Plan:  Continue Wellbutrin 150 mg XL daily Xanax 0.5 mg 3 times daily; benefits outweigh the risks at this time, particularly given that this is improving her quality of life, I have educated the patient repeatedly on the significant risks of increased falls.  Attempts to taper and discontinue have been met with significant increase in anxiety and panic and tearfulness Continue to work with neurology on Sinemet titration Return to clinic in 2 months, transfer care to Dr. Adele Schilder in September  I spent 30 minutes with the patient in direct face-to-face clinical care.  Greater than 50% of this time was spent in counseling and coordination of care with the patient.    Aundra Dubin, MD 11/15/2017, 10:21 AM

## 2017-11-19 ENCOUNTER — Encounter (HOSPITAL_COMMUNITY): Payer: Self-pay | Admitting: Psychiatry

## 2017-11-19 DIAGNOSIS — N644 Mastodynia: Secondary | ICD-10-CM | POA: Diagnosis not present

## 2017-11-20 ENCOUNTER — Encounter: Payer: Self-pay | Admitting: Family Medicine

## 2017-11-27 ENCOUNTER — Ambulatory Visit (INDEPENDENT_AMBULATORY_CARE_PROVIDER_SITE_OTHER): Payer: Medicare Other | Admitting: Psychiatry

## 2017-11-27 DIAGNOSIS — F411 Generalized anxiety disorder: Secondary | ICD-10-CM

## 2017-11-28 ENCOUNTER — Encounter: Payer: Self-pay | Admitting: Family Medicine

## 2017-11-28 ENCOUNTER — Ambulatory Visit (INDEPENDENT_AMBULATORY_CARE_PROVIDER_SITE_OTHER): Payer: Medicare Other | Admitting: Family Medicine

## 2017-11-28 ENCOUNTER — Ambulatory Visit (INDEPENDENT_AMBULATORY_CARE_PROVIDER_SITE_OTHER): Payer: Medicare Other | Admitting: Neurology

## 2017-11-28 ENCOUNTER — Encounter: Payer: Self-pay | Admitting: Neurology

## 2017-11-28 VITALS — BP 127/66 | HR 79 | Ht 59.0 in | Wt 104.5 lb

## 2017-11-28 VITALS — BP 120/70 | HR 84 | Temp 99.0°F | Ht 59.0 in | Wt 104.6 lb

## 2017-11-28 DIAGNOSIS — F411 Generalized anxiety disorder: Secondary | ICD-10-CM | POA: Diagnosis not present

## 2017-11-28 DIAGNOSIS — R300 Dysuria: Secondary | ICD-10-CM

## 2017-11-28 DIAGNOSIS — G252 Other specified forms of tremor: Secondary | ICD-10-CM

## 2017-11-28 DIAGNOSIS — D692 Other nonthrombocytopenic purpura: Secondary | ICD-10-CM

## 2017-11-28 DIAGNOSIS — M7550 Bursitis of unspecified shoulder: Secondary | ICD-10-CM

## 2017-11-28 DIAGNOSIS — R269 Unspecified abnormalities of gait and mobility: Secondary | ICD-10-CM | POA: Insufficient documentation

## 2017-11-28 DIAGNOSIS — R259 Unspecified abnormal involuntary movements: Secondary | ICD-10-CM | POA: Diagnosis not present

## 2017-11-28 DIAGNOSIS — S0093XA Contusion of unspecified part of head, initial encounter: Secondary | ICD-10-CM | POA: Diagnosis not present

## 2017-11-28 LAB — POCT URINALYSIS DIP (PROADVANTAGE DEVICE)
BILIRUBIN UA: NEGATIVE
BILIRUBIN UA: NEGATIVE mg/dL
Glucose, UA: NEGATIVE mg/dL
Nitrite, UA: NEGATIVE
PH UA: 6 (ref 5.0–8.0)
Protein Ur, POC: 30 mg/dL — AB
Specific Gravity, Urine: 1.02
Urobilinogen, Ur: NEGATIVE

## 2017-11-28 NOTE — Progress Notes (Signed)
[facility]  PATIENT: Tracy Malone DOB: November 20, 1931  REFERRING DOCTOR OR PCP:  Joselyn Arrow SOURCE: patient and EMR records  _________________________________   HISTORICAL  CHIEF COMPLAINT:  Chief Complaint  Patient presents with  . Follow-up    patient here with her husband to discuss her worsening tremors.     HISTORY OF PRESENT ILLNESS:  Tracy Malone is an 84year old woman with tremor and shoulder pain  Update 11/28/2017: Her right hand tremor has worsened.  She does not note any significant tremor on the left.  Her tremor will worsen if she is anxious.  She is on Xanax 0.5 mg tid and that helps the tremor some.    She is on Sinemet CR 25/100.   She can tolerate one pill a day but on 2 pills she had a headache.  On 1 pill, she does not note any benefit.  We discussed that that dose is extremely low and would be unlikely to make a difference.    She stumbled some with her gait but has not had falls.  She is concerned about interactions between Wellbutrin and Sinemet.   We discussed that they can be taken at the same time.     She has shoulder pain on her right.   She only got short term benefit from the subacromial bursa injection.    From 03/16/2017: Tremor:   She has a tremor that is worse in the right arm the left arm. She notes it is generally worse when she is aggravated or if she tries to hold still such as holding a cup or spoon to her mouth. She also notes it with writing. Alprazolam has helped the tremor some she takes a low dose 0.25 mg 2-3 times a day.   She first noted a right hand tremor in late 2015.    She notes the tremor in the right hand much more than the left.      Tremor is not present when asleep.    She has not noted the tremor in the head.   She has not had any difficulty with her gait or balance. She has had a couple falls but they occurred with tripping. There is no family history of tremors.   Shoulder pain:   The right shoulder pain  returned. It is worse when she holds her arm over her head or externally rotates.  Pain is best when she holds the arm into her chest. In the past, we did a subacromial bursa injection and that helped her for 2-3 months. She also received an injection by her orthopedic surgeon that also helped for 2-3 months.   NSAIDs have not helped.  Nodules:   She has a nodule in the right arm.   She has shown her PCP  Mood:  She has noted some crying spells but no laughing spells. The crying spells and sometimes spontaneous often she is sad at that time. The history of depression. She prefers not to be on an antidepressant at this time.   REVIEW OF SYSTEMS: Constitutional: No fevers, chills, sweats, or change in appetite Eyes: No visual changes, double vision, eye pain Ear, nose and throat: No hearing loss, ear pain, nasal congestion, sore throat Cardiovascular: No chest pain, palpitations.  Has had palpitations Respiratory: No shortness of breath at rest or with exertion.   No wheezes GastrointestinaI: No nausea, vomiting, diarrhea, abdominal pain, fecal incontinence Genitourinary: No dysuria, urinary retention or frequency.  No nocturia. Musculoskeletal:  No neck pain, back pain.  She notes right shoulder pain Integumentary: No rash, pruritus, skin lesions Neurological: as above Psychiatric: Some crying spells..  Some anxiety Endocrine: No palpitations, diaphoresis, change in appetite, change in weigh or increased thirst Hematologic/Lymphatic: No anemia, purpura, petechiae. Allergic/Immunologic: No itchy/runny eyes, nasal congestion, recent allergic reactions, rashes  ALLERGIES: Allergies  Allergen Reactions  . Iodine Anaphylaxis    IV and topical forms.  [name] [Hyoscyamine Sulfate]     Vision problems/pt has glaucoma  . Salmon [Fish Allergy] Hives and Shortness Of Breath  . Shellfish Allergy Anaphylaxis  . Remeron [Mirtazapine] Other (See Comments)    Cause blurred vision and red eyes,  pt has glaucoma  . Aspirin Other (See Comments)    Sever stomach pain due to ulcer scaring.  . Ciprofloxacin Diarrhea  . Codeine Nausea And Vomiting  . Darvocet [Propoxyphene N-Acetaminophen] Nausea And Vomiting  . Demerol [Meperidine] Nausea Only  . Dexilant [Dexlansoprazole] Swelling    Redness, swelling and peeling of both feet.  . Diphedryl [Diphenhydramine] Other (See Comments)    Increased pulse/small amount ok  . Doxycycline Hyclate Other (See Comments)    GI intolerance.  Marland Kitchen Epinephrine Other (See Comments)    Breathing problems  . Erythromycin Other (See Comments)    GI intolerance.  [name] [Cyclobenzaprine] Other (See Comments)    Tingly/prickly sensation.  Marland Kitchen Keflex [Cephalexin] Hives  . Latex Other (See Comments)    Gloves ok.  Skin gets red from elastic in underwear and latex bandaides.  . Prednisone Other (See Comments)    Headache  . Pylera [Bis Subcit-Metronid-Tetracyc] Swelling    Tongue swelling. Face tingling  . Sulfa Antibiotics Other (See Comments)    Increased pulse, fainting, diarrhea, thrush  . Xylocaine [Lidocaine Hcl]     With epinephrine, given by dentist.  Speeded up heart rate and she passed out (occured twice, at dentist)  . Zoloft [Sertraline Hcl] Swelling and Other (See Comments)    Migraine Swelling of tongue/lip (09/2012)  . Advil [Ibuprofen] Other (See Comments)    Motrin ok with a GI effect.  . Clarithromycin Rash    Started after completing 10 day course of 2000 mg /day    HOME MEDICATIONS:  Current Outpatient Medications:  .  acetaminophen (TYLENOL) 650 MG CR tablet, Take 650 mg by mouth as needed for pain. Reported on 09/06/2015, Disp: , Rfl:  .  ALPRAZolam (XANAX) 0.5 MG tablet, Use up to 3 times daily.  Warning: benzodiazepines increase the risk of falls and neurocognitive decline in the elderly, Disp: 90 tablet, Rfl: 2 .  b complex vitamins capsule, Take 1 capsule by mouth as needed. , Disp: , Rfl:  .  buPROPion (WELLBUTRIN XL)  150 MG 24 hr tablet, Take 1 tablet (150 mg total) by mouth every morning., Disp: 90 tablet, Rfl: 1 .  Carbidopa-Levodopa ER (SINEMET CR) 25-100 MG tablet controlled release, Take 1 tablet by mouth 3 (three) times daily., Disp: 90 tablet, Rfl: 2 .  Cholecalciferol (VITAMIN D) 2000 UNITS tablet, Take 2,000 Units by mouth daily., Disp: , Rfl:  .  Probiotic Product (ALIGN PO), Take 1 capsule by mouth every other day. , Disp: , Rfl:  .  SYNTHROID 25 MCG tablet, Take 1 tablet (25 mcg total) by mouth daily before breakfast., Disp: 30 tablet, Rfl: 5 .  vitamin E 400 UNIT capsule, Take 400 Units by mouth every Monday, Wednesday, and Friday. Reported on 09/06/2015, Disp: , Rfl:   PAST MEDICAL HISTORY:  Past Medical History:  Diagnosis Date  . Bell's palsy 1966   ? right side facial droop  . Carotid artery disease (HCC) 2010   on vascular screening;unchanged 2013.(could not tolerate simvastatin, no other statins tried)--<30% blockage bilat 07/2011  . Chronic abdominal pain   . Chronic fatigue and malaise   . Claustrophobia   . Depression    treated in the past for years;stopped in 2010 for a years  . Duodenal ulcer 1962   h/o  . Dysrhythmia    ocassional PVC's  . Fibromyalgia   . 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 GERD (gastroesophageal reflux disease)   . Glaucoma, narrow-angle    s/p laser surgery  . History of hiatal hernia    during endoscopy  . Hypothyroid 9/08  . IBS (irritable bowel syndrome)    Dr. Elnoria Howard  . Ocular migraine   . Osteoporosis 10/11   Dr.Hawkes  . Panic attack   . Recurrent UTI    has cystocele-Dr.Grewal  . Shingles 1999   h/o  . Superficial thrombophlebitis 03/2009   RLE  . Trochanteric bursitis 12/2008   bilateral    PAST SURGICAL HISTORY: Past Surgical History:  Procedure Laterality Date  . ABDOMINAL HYSTERECTOMY    . CATARACT EXTRACTION, BILATERAL  1995, 1996  . Flexible sigmoidoscopy    . THYROIDECTOMY,  PARTIAL  09/2005   L nodule; Dr. Gerrit Friends  . TONSILLECTOMY  1946  . UPPER GI ENDOSCOPY  06/27/12  . VAGINAL HYSTERECTOMY  1971   and bladder repair.  Still has ovaries    FAMILY HISTORY: Family History  Problem Relation Age of Onset  . Heart disease Mother   . Hypertension Mother   . Hypertension Sister   . HIV Son   . Heart disease Brother   . Lung cancer Brother        lung  . Diabetes Maternal Grandfather     SOCIAL HISTORY:  Social History   Socioeconomic History  . Marital status: Married    Spouse name: Not on file  . Number of children: 2  . Years of education: Not on file  . Highest education level: Not on file  Occupational History  . Occupation: retired (school system)  Social Needs  . Financial resource strain: Not on file  . Food insecurity:    Worry: Not on file    Inability: Not on file  . Transportation needs:    Medical: Not on file    Non-medical: Not on file  Tobacco Use  . Smoking status: Never Smoker  . Smokeless tobacco: Never Used  Substance and Sexual Activity  . Alcohol use: No    Alcohol/week: 0.0 oz  . Drug use: No  . Sexual activity: Never  Lifestyle  . Physical activity:    Days per week: Not on file    Minutes per session: Not on file  . Stress: Not on file  Relationships  . Social connections:    Talks on phone: Not on file    Gets together: Not on file    Attends religious service: Not on file    Active member of club or organization: Not on file    Attends meetings of clubs or organizations: Not on file    Relationship status: Not on file  . Intimate partner violence:    Fear of current or ex partner: Not on file    Emotionally abused: Not on file  Physically abused: Not on file    Forced sexual activity: Not on file  Other Topics Concern  . Not on file  Social History Narrative   Married.  Son lives in [place]; Daughter [name] lives in [place]; 2 grandchildren     PHYSICAL EXAM  Vitals:   11/28/17 1108  BP:  127/66  Pulse: 79  Weight: 104 lb 8 oz (47.4 kg)  Height: 4\' 11"  (1.499 m)    Body mass index is 21.11 kg/m.   General: The patient is well-developed and well-nourished and in no acute distress  Musculoskeletal:  There is tenderness over the right subacromial bursa and she has a reduced range of motion of the right shoulder, limited by pain.  Skin:   No edema.    There were a couple small subcutaneous nodules in the right arm. .    Neurologic Exam  Mental status: The patient is alert and oriented x 3 at the time of the examination. The patient has apparent normal recent and remote memory, with an apparently normal attention span and concentration ability.   Speech is normal.  Cranial nerves: Extraocular movements are full.   Facial strength is normal.  Trapezius and sternocleidomastoid strength is normal. No dysarthria is noted.    Motor: She does not have bradykinesia.  There is a 6 Hz tremor on the right that is worse during rest and with intention.   Muscle bulk is normal.   Tone is normal. No cogwheeling.  Strength is  5 / 5 in all 4 extremities.   Sensory: Sensory testing is intact to touch and vibration sensation in all 4 extremities.  Coordination: Cerebellar testing reveals good finger-nose-finger bilaterally.  Gait and station: Station is normal.   The gait is normal for age.  Her tandem gait is minimally wide but normal for age.  She did not have retropulsion to mild disturbances of posture but did have a few steps with larger disturbances.  Romberg is negative.   Reflexes: Deep tendon reflexes are symmetric and normal bilaterally.    DIAGNOSTIC DATA (LABS, IMAGING, TESTING) - I reviewed patient records, labs, notes, testing and imaging myself where available.  Lab Results  Component Value Date   WBC 11.6 (H) 08/22/2017   HGB 14.2 08/22/2017   HCT 42.7 08/22/2017   MCV 88 08/22/2017   PLT 282 08/22/2017      Component Value Date/Time   NA 144 08/22/2017 1329     K 4.4 08/22/2017 1329   CL 101 08/22/2017 1329   CO2 26 08/22/2017 1329   GLUCOSE 94 08/22/2017 1329   GLUCOSE 93 04/30/2017 1413   BUN 16 08/22/2017 1329   CREATININE 0.70 08/22/2017 1329   CREATININE 0.67 11/23/2016 0815   CALCIUM 9.4 08/22/2017 1329   PROT 6.8 08/22/2017 1329   ALBUMIN 4.0 08/22/2017 1329   AST 21 08/22/2017 1329   ALT 13 08/22/2017 1329   ALKPHOS 75 08/22/2017 1329   BILITOT 1.0 08/22/2017 1329   GFRNONAA 80 08/22/2017 1329   GFRAA 92 08/22/2017 1329   Lab Results  Component Value Date   CHOL 218 (H) 11/23/2016   HDL 78 11/23/2016   LDLCALC 115 (H) 11/23/2016   TRIG 123 11/23/2016   CHOLHDL 2.8 11/23/2016   No results found for: HGBA1C No results found for: VITAMINB12 Lab Results  Component Value Date   TSH 1.810 10/04/2017       ASSESSMENT AND PLAN  Resting tremor  Anxiety state  Gait disturbance  Subacromial  bursitis  1.    It is still not completely clear whether she has Parkinson's but she does have some retropulsion on today's examination making that diagnosis more likely.  I discussed with her and her husband to try to very slowly increase the Sinemet dose to a total of 300 mg a day of levodopa.  They can go up 1/2 pill at a time.   2.    She will continue Xanax and Wellbutrin.  She should follow-up with psychiatry for her anxiety.   3.    She is seeing Dr. Arbutus Leas and I discussed with them that her movement disorders might be better treated by a subspecialist and that I do very little movement disorders.   4.   She will return to see me as needed if there are new or worsening neurologic symptoms.   Nur Krasinski A. Epimenio Foot, MD, PhD 11/28/2017, 12:38 PM Certified in Neurology, Clinical Neurophysiology, Sleep Medicine, Pain Medicine and Neuroimaging  [facility] [address] [place], [place] [address] [phone]

## 2017-11-28 NOTE — Patient Instructions (Signed)
  Continue the cranberry extract tablets and drinking lots of water. Let us know if your urinary symptoms return and worsen. We are sending your urine for culture, but am not using this to decide whether or not you need antibiotics. That decision is made only on if you have ongoing urinary symptoms (burning with urination, urgency, frequency, flank pain, fevers).   Your head looks fine--no swelling or bruising.  Try and be careful. Return or seek help if any confusion, dizziness, severe headaches, vomiting, or any other neurologic symptoms develop.

## 2017-11-28 NOTE — Progress Notes (Signed)
Chief Complaint  Patient presents with  . Fall    fell in bedroom Fri night and hit head on bedside table (slipped on pillow next to bed). And then last night she hit her head on counter in the kitchen while coming up from getting something on floor.  . Urinary Tract Infection    last night urinary burning started.     She had a pillow laying against the nighstand, stepped on the edge of it, slid, and the back of her head hit the door, pushing her head forward.  This was 4 nights ago. No loss of consciousness, but her neck hurts some. She has some discomfort at the back of her head and neck.  No bleeding. She has been sore since then, but no severe headaches or dizziness.  Denies numbness, tingling, weakness.  Last night she heard something fall in the dining room, saw a dish fell. She leaned down to pick it up, and as she was getting up, she hit the top/right side of the head on the underneath part of counter. Didn't have any swelling, but has been sore and hurting. She iced it right away. It feels sore, but no headaches, nausea.  She took some Tylenol for the discomfort, which helped.  Last night she noticed burning when she went to the bathroom. Not having any symptoms today. She took cranberry pills last night. Urine has been very light in color.  Denies cloudy, no blood, no odor. She hasn't had any further burning or urinary discomfort during the day today, just last night.   Last UTI was 09/2017 (originally diagnosed at [facility], no culture done), treated with macrobid (took a while for symptoms to completely resolve, but ultimately did, with only using macrobid, never starting the cipro that was rx'd at f/u).  No flank pain, nausea, vomiting. Some constipation from her medication (relieved by prunes and pistachios).  Denies abdominal pain. +head pain (right side, from injury last night).  PMH, PSH, SH reviewed Meds and allergies reviewed  ROS: no fever, chills, dizziness, nausea, vomiting.  Bleeding, bruising, rash, confusion or altered mental status.  Pain in head as per HPI, and some neck pain. No numbness, tingling, weakness. No abdominal pain, hematuria, incontinence, flank pain.  +constipation, stable.  PHYSICAL EXAM:  BP 120/70   Pulse 84   Temp 99 F (37.2 C) (Tympanic)   Ht 4\' 11"  (1.499 m)   Wt 104 lb 9.6 oz (47.4 kg)   BMI 21.13 kg/m   Well-appearing female, tremor of right hand, in no distress Tender along the right parietal scalp. No bruising, soft tissue swelling, skin is intact. She is also tender at the occiput posteriorly (bilateral)--no bruising or soft tissue swelling Cervical spine is nontender, FROM Neck: no lymphadenopathy or mass Back: no flank pain Abdomen: soft, nontender, no mass Heart: regular rate and rhythm Lungs: clear Extremities: no edema Neuro: alert and oriented. Resting tremor of R hand. Normal gait (she saw neuro this morning). Skin: normal turgor, no rash.  Some senile purpura noted on hands.   Urine dip: SG 1.020, trace protein, small blood, 3+ leukocytes   ASSESSMENT/PLAN:   Contusion of head, unspecified part of head, initial encounter - reassured. reviewed red flag s/sx to be concerned about. No significant injury  Burning with urination - abnormal urine dip, but sx resolved. Don't want to treat asymptomatic bacteriuria. Culture sent so that if sx worsen, we have for ABX decision, hope to avoid - Plan: POCT Urinalysis DIP (Proadvantage  Device), Urine Culture  Senile purpura ([place]) - reassured    Continue the cranberry extract tablets and drinking lots of water. Let us know if your urinary symptoms return and worsen. We are sending your urine for culture, but am not using this to decide whether or not you need antibiotics. That decision is made only on if you have ongoing urinary symptoms (burning with urination, urgency, frequency, flank pain, fevers).

## 2017-11-29 ENCOUNTER — Encounter: Payer: Self-pay | Admitting: Family Medicine

## 2017-11-29 ENCOUNTER — Ambulatory Visit: Payer: Medicare Other | Admitting: Family Medicine

## 2017-11-30 ENCOUNTER — Encounter (HOSPITAL_COMMUNITY): Payer: Self-pay | Admitting: Psychiatry

## 2017-11-30 ENCOUNTER — Encounter: Payer: Self-pay | Admitting: Family Medicine

## 2017-11-30 MED ORDER — NITROFURANTOIN MONOHYD MACRO 100 MG PO CAPS: 100.0000 mg | ORAL_CAPSULE | Freq: Two times a day (BID) | ORAL | 0 refills | Status: DC

## 2017-12-01 LAB — URINE CULTURE

## 2017-12-02 ENCOUNTER — Encounter: Payer: Self-pay | Admitting: Family Medicine

## 2017-12-03 ENCOUNTER — Encounter: Payer: Self-pay | Admitting: Family Medicine

## 2017-12-03 ENCOUNTER — Encounter: Payer: Self-pay | Admitting: Podiatry

## 2017-12-03 ENCOUNTER — Encounter: Payer: Self-pay | Admitting: Neurology

## 2017-12-04 ENCOUNTER — Encounter: Payer: Self-pay | Admitting: Family Medicine

## 2017-12-04 ENCOUNTER — Ambulatory Visit (INDEPENDENT_AMBULATORY_CARE_PROVIDER_SITE_OTHER): Payer: Medicare Other | Admitting: Psychiatry

## 2017-12-04 DIAGNOSIS — I8311 Varicose veins of right lower extremity with inflammation: Secondary | ICD-10-CM | POA: Diagnosis not present

## 2017-12-04 DIAGNOSIS — I8312 Varicose veins of left lower extremity with inflammation: Secondary | ICD-10-CM | POA: Diagnosis not present

## 2017-12-04 DIAGNOSIS — F411 Generalized anxiety disorder: Secondary | ICD-10-CM

## 2017-12-05 ENCOUNTER — Encounter: Payer: Self-pay | Admitting: Family Medicine

## 2017-12-06 ENCOUNTER — Encounter: Payer: Self-pay | Admitting: Family Medicine

## 2017-12-06 ENCOUNTER — Other Ambulatory Visit: Payer: Self-pay | Admitting: *Deleted

## 2017-12-06 MED ORDER — CIPROFLOXACIN HCL 250 MG PO TABS: 250.0000 mg | ORAL_TABLET | Freq: Two times a day (BID) | ORAL | 0 refills | Status: DC

## 2017-12-06 NOTE — Telephone Encounter (Signed)
FYI--I told her we were holding off on sending in the #8 until we hear back

## 2017-12-09 ENCOUNTER — Encounter (HOSPITAL_COMMUNITY): Payer: Self-pay | Admitting: Psychiatry

## 2017-12-11 ENCOUNTER — Ambulatory Visit (INDEPENDENT_AMBULATORY_CARE_PROVIDER_SITE_OTHER): Payer: Medicare Other | Admitting: Psychiatry

## 2017-12-11 DIAGNOSIS — F411 Generalized anxiety disorder: Secondary | ICD-10-CM | POA: Diagnosis not present

## 2017-12-12 ENCOUNTER — Encounter: Payer: Self-pay | Admitting: Family Medicine

## 2017-12-12 ENCOUNTER — Telehealth: Payer: Self-pay | Admitting: Neurology

## 2017-12-12 ENCOUNTER — Encounter: Payer: Self-pay | Admitting: Neurology

## 2017-12-12 NOTE — Telephone Encounter (Signed)
Pt is needing Dr Felecia Shelling to be aware that re taking:Carbidopa-Levodopa ER (SINEMET CR) 25-100 MG tablet controlled release her  her vision is getting blurry and her pupils are getting very constricted.  Pt wants Dr Felecia Shelling to be aware she has scheduled an appointment with her Eye Doctor to be checked out.  Please call

## 2017-12-12 NOTE — Telephone Encounter (Signed)
Spoke with Tracy Malone.  Several issues:    1--She sts. she has a hx. of narrow angle glaucoma and wanted to know if it is safe to continue Carbidopa-Levodopa with this hx.  Per RAS, I advised it is ok to continue Carbidopa-Levodopa.   2--She sts. she has seen psychiatry for her anxiety and depression and he d/c her Wellbutrin.  She questions this decision.  I have explained that she needs to address these concerns to the psychiatrist--he is the best person to answer these questions for her.   3--She would like a 3rd opinion from a movement disorder specialist regarding PD dx. and wondered if [facility] or [facility] is best.  I have explained that RAS does not feel one is better than the other; she would do well with either facility. She verbalized understanding of all of above/fim

## 2017-12-14 ENCOUNTER — Encounter (HOSPITAL_COMMUNITY): Payer: Self-pay | Admitting: Psychiatry

## 2017-12-14 DIAGNOSIS — H04123 Dry eye syndrome of bilateral lacrimal glands: Secondary | ICD-10-CM | POA: Diagnosis not present

## 2017-12-14 DIAGNOSIS — H5703 Miosis: Secondary | ICD-10-CM | POA: Diagnosis not present

## 2017-12-20 ENCOUNTER — Ambulatory Visit (INDEPENDENT_AMBULATORY_CARE_PROVIDER_SITE_OTHER): Payer: Medicare Other | Admitting: Psychiatry

## 2017-12-20 DIAGNOSIS — F411 Generalized anxiety disorder: Secondary | ICD-10-CM | POA: Diagnosis not present

## 2017-12-24 ENCOUNTER — Ambulatory Visit (INDEPENDENT_AMBULATORY_CARE_PROVIDER_SITE_OTHER): Payer: Medicare Other | Admitting: Psychiatry

## 2017-12-24 ENCOUNTER — Encounter (HOSPITAL_COMMUNITY): Payer: Self-pay | Admitting: Psychiatry

## 2017-12-24 VITALS — BP 126/72 | HR 98 | Ht 59.0 in | Wt 107.0 lb

## 2017-12-24 DIAGNOSIS — G20 Parkinson's disease: Secondary | ICD-10-CM | POA: Diagnosis not present

## 2017-12-24 DIAGNOSIS — F332 Major depressive disorder, recurrent severe without psychotic features: Secondary | ICD-10-CM | POA: Diagnosis not present

## 2017-12-24 DIAGNOSIS — F4321 Adjustment disorder with depressed mood: Secondary | ICD-10-CM

## 2017-12-24 DIAGNOSIS — F4329 Adjustment disorder with other symptoms: Secondary | ICD-10-CM

## 2017-12-24 DIAGNOSIS — Z634 Disappearance and death of family member: Secondary | ICD-10-CM

## 2017-12-24 MED ORDER — FLUOXETINE HCL 10 MG PO CAPS: 10.0000 mg | ORAL_CAPSULE | Freq: Every day | ORAL | 0 refills | Status: DC

## 2017-12-24 NOTE — Progress Notes (Signed)
BH MD/PA/NP OP Progress Note  12/24/2017 12:40 PM Tracy Malone  MRN:  [id number]  Chief Complaint: not happy HPI: Tracy Malone presents with ongoing tearfulness, grief and anxiety about her dx of parkinsons disease.  She does not feel Sinemet has been beneficial, I encouraged her to coordinate with her neurologist and consider second opinion.  Suggested that [facility] has a movement disorders clinic which may provide her a second opinion.  Discussed much of her anxiety/mood issues are related to ongoing depression and grief related to dx.  We agreed to start prozac, which she has been prescribed by writer but never started.  Suggest initiate prozac and continue PRN xanax.  She discontinued wellbutrin due to dizziness and insomnia.  She did try melatonin for sleep and found it to be helpful.  No acute SI, HI, AVH. Husband presents and able to corroborate patients ongoing tearfulness, grief about diagnosis.  Visit Diagnosis:    ICD-10-CM   1. Major depressive disorder, recurrent, severe without psychotic features ([place]) F33.2 FLUoxetine (PROZAC) 10 MG capsule  2. Parkinson's disease ([place]) G20   3. Complicated grief W43.15    Z63.4     Past Psychiatric History: See intake H&P for full details. Reviewed, with no updates at this time.   Past Medical History:  Past Medical History:  Diagnosis Date  . Bell's palsy 1966   ? right side facial droop  . Carotid artery disease ([place]) 2010   on vascular screening;unchanged 2013.(could not tolerate simvastatin, no other statins tried)--<30% blockage bilat 07/2011  . Chronic abdominal pain   . Chronic fatigue and malaise   . Claustrophobia   . Depression    treated in the past for years;stopped in 2010 for a years  . Duodenal ulcer 1962   h/o  . Dysrhythmia    ocassional PVC's  . Fibromyalgia   . Frequent PVCs 07/2012   Seen by [facility]: benign, asymptomatic, normal EF  . GERD (gastroesophageal reflux disease)   . Glaucoma,  narrow-angle    s/p laser surgery  . History of hiatal hernia    during endoscopy  . Hypothyroid 9/08  . IBS (irritable bowel syndrome)    Dr. Benson Norway  . Ocular migraine   . Osteoporosis 10/11   Dr.Hawkes  . Panic attack   . Recurrent UTI    has cystocele-Dr.Grewal  . Shingles 1999   h/o  . Superficial thrombophlebitis 03/2009   RLE  . Trochanteric bursitis 12/2008   bilateral    Past Surgical History:  Procedure Laterality Date  . ABDOMINAL HYSTERECTOMY    . CATARACT EXTRACTION, BILATERAL  1995, 1996  . Flexible sigmoidoscopy    . THYROIDECTOMY, PARTIAL  09/2005   L nodule; Dr. Harlow Asa  . TONSILLECTOMY  1946  . UPPER GI ENDOSCOPY  06/27/12  . VAGINAL HYSTERECTOMY  1971   and bladder repair.  Still has ovaries    Family Psychiatric History: See intake H&P for full details. Reviewed, with no updates at this time.   Family History:  Family History  Problem Relation Age of Onset  . Heart disease Mother   . Hypertension Mother   . Hypertension Sister   . HIV Son   . Heart disease Brother   . Lung cancer Brother        lung  . Diabetes Maternal Grandfather     Social History:  Social History   Socioeconomic History  . Marital status: Married    Spouse name: Not on file  .  Number of children: 2  . Years of education: Not on file  . Highest education level: Not on file  Occupational History  . Occupation: retired (school system)  Social Needs  . Financial resource strain: Not on file  . Food insecurity:    Worry: Not on file    Inability: Not on file  . Transportation needs:    Medical: Not on file    Non-medical: Not on file  Tobacco Use  . Smoking status: Never Smoker  . Smokeless tobacco: Never Used  Substance and Sexual Activity  . Alcohol use: No    Alcohol/week: 0.0 oz  . Drug use: No  . Sexual activity: Never  Lifestyle  . Physical activity:    Days per week: Not on file    Minutes per session: Not on file  . Stress: Not on file   Relationships  . Social connections:    Talks on phone: Not on file    Gets together: Not on file    Attends religious service: Not on file    Active member of club or organization: Not on file    Attends meetings of clubs or organizations: Not on file    Relationship status: Not on file  Other Topics Concern  . Not on file  Social History Narrative   Married.  Son lives in [place]; Daughter [name] lives in [place]; 2 grandchildren    Allergies:  Allergies  Allergen Reactions  . Iodine Anaphylaxis    IV and topical forms.  Burnard Leigh [Hyoscyamine Sulfate]     Vision problems/pt has glaucoma  . Salmon [Fish Allergy] Hives and Shortness Of Breath  . Shellfish Allergy Anaphylaxis  . Remeron [Mirtazapine] Other (See Comments)    Cause blurred vision and red eyes, pt has glaucoma  . Aspirin Other (See Comments)    Sever stomach pain due to ulcer scaring.  . Ciprofloxacin Diarrhea  . Codeine Nausea And Vomiting  . Darvocet [Propoxyphene N-Acetaminophen] Nausea And Vomiting  . Demerol [Meperidine] Nausea Only  . Dexilant [Dexlansoprazole] Swelling    Redness, swelling and peeling of both feet.  . Diphedryl [Diphenhydramine] Other (See Comments)    Increased pulse/small amount ok  . Doxycycline Hyclate Other (See Comments)    GI intolerance.  Marland Kitchen Epinephrine Other (See Comments)    Breathing problems  . Erythromycin Other (See Comments)    GI intolerance.  Yvette Rack [Cyclobenzaprine] Other (See Comments)    Tingly/prickly sensation.  Marland Kitchen Keflex [Cephalexin] Hives  . Latex Other (See Comments)    Gloves ok.  Skin gets red from elastic in underwear and latex bandaides.  . Prednisone Other (See Comments)    Headache  . Pylera [Bis Subcit-Metronid-Tetracyc] Swelling    Tongue swelling. Face tingling  . Sulfa Antibiotics Other (See Comments)    Increased pulse, fainting, diarrhea, thrush  . Xylocaine [Lidocaine Hcl]     With epinephrine, given by dentist.  Speeded up heart rate and  she passed out (occured twice, at dentist)  . Zoloft [Sertraline Hcl] Swelling and Other (See Comments)    Migraine Swelling of tongue/lip (09/2012)  . Advil [Ibuprofen] Other (See Comments)    Motrin ok with a GI effect.  . Clarithromycin Rash    Started after completing 10 day course of 2000 mg /day    Metabolic Disorder Labs: No results found for: HGBA1C, MPG No results found for: PROLACTIN Lab Results  Component Value Date   CHOL 218 (H) 11/23/2016   TRIG 123 11/23/2016  HDL 78 11/23/2016   CHOLHDL 2.8 11/23/2016   VLDL 25 11/23/2016   LDLCALC 115 (H) 11/23/2016   LDLCALC 121 05/04/2015   Lab Results  Component Value Date   TSH 1.810 10/04/2017   TSH 0.625 08/15/2017    Therapeutic Level Labs: No results found for: LITHIUM No results found for: VALPROATE No components found for:  CBMZ  Current Medications: Current Outpatient Medications  Medication Sig Dispense Refill  . acetaminophen (TYLENOL) 650 MG CR tablet Take 650 mg by mouth as needed for pain. Reported on 09/06/2015    . ALPRAZolam (XANAX) 0.5 MG tablet Use up to 3 times daily.  Warning: benzodiazepines increase the risk of falls and neurocognitive decline in the elderly 90 tablet 2  . b complex vitamins capsule Take 1 capsule by mouth as needed.     . Carbidopa-Levodopa ER (SINEMET CR) 25-100 MG tablet controlled release Take 1 tablet by mouth 3 (three) times daily. 90 tablet 2  . Cholecalciferol (VITAMIN D) 2000 UNITS tablet Take 2,000 Units by mouth daily.    . Probiotic Product (ALIGN PO) Take 1 capsule by mouth every other day.     Marland Kitchen SYNTHROID 25 MCG tablet Take 1 tablet (25 mcg total) by mouth daily before breakfast. 30 tablet 5  . vitamin E 400 UNIT capsule Take 400 Units by mouth every Monday, Wednesday, and Friday. Reported on 09/06/2015    . FLUoxetine (PROZAC) 10 MG capsule Take 1 capsule (10 mg total) by mouth daily. 90 capsule 0   No current facility-administered medications for this visit.      Musculoskeletal: Strength & Muscle Tone: within normal limits Gait & Station: normal Patient leans: N/A  Psychiatric Specialty Exam: ROS  Blood pressure 126/72, pulse 98, height 4\' 11"  (1.499 m), weight 107 lb (48.5 kg), SpO2 98 %.Body mass index is 21.61 kg/m.  General Appearance: Casual and Fairly Groomed  Eye Contact:  Fair  Speech:  Normal Rate  Volume:  Normal  Mood:  Depressed and Dysphoric  Affect:  Appropriate and Congruent  Thought Process:  Coherent, Goal Directed and Descriptions of Associations: Intact  Orientation:  Full (Time, Place, and Person)  Thought Content: Logical   Suicidal Thoughts:  No  Homicidal Thoughts:  No  Memory:  Immediate;   Fair  Judgement:  Intact  Insight:  Shallow  Psychomotor Activity:  Normal  Concentration:  Concentration: Good  Recall:  Good  Fund of Knowledge: Good  Language: Good  Akathisia:  Negative  Handed:  Right  AIMS (if indicated): not done  Assets:  Communication Skills Desire for Improvement Financial Resources/Insurance Housing  ADL's:  Intact  Cognition: WNL  Sleep:  Fair   Screenings: ECT-MADRS     ECT Treatment from 05/07/2017 in [facility]  MADRS Total Score  31    Mini-Mental     ECT Treatment from 05/07/2017 in [facility]  Total Score (max 30 points )  30    PHQ2-9     Office Visit from 08/27/2017 in [facility] Visit from 09/26/2016 in [facility] Office Visit from 05/04/2015 in [facility] Visit from 03/04/2015 in [facility] Office Visit from 01/27/2015 in [facility]  PHQ-2 Total Score  4  0  2  0  0       Assessment and Plan:  Tracy Malone presents  with ongoing depression and grief related to Parkinson's disease diagnosis, and superimposed on pre-existing depression and generally  poor coping skills.  The patient seems to respond well to validation and listening, and I have reiterated the importance of individual therapy.  She has good support from her husband.  Pharmacologically, she is incredibly sensitive to medications, and we agreed to proceed with a low-dose of Prozac which she has tolerated in the past.  She continues on Xanax as needed 3-4 times per day, and melatonin at night.  We recently discontinued Wellbutrin because it seemed to cause her some dizziness and insomnia.  She continues to be preoccupied with the diagnosis of Parkinson's disease and may pursue a second opinion.  She understands Probation officer is transitioning out of clinic and she will follow-up in her psychiatric care with Dr. Adele Schilder or another psychiatrist at her discretion.  1. Major depressive disorder, recurrent, severe without psychotic features ([place])   2. Parkinson's disease ([place])   3. Complicated grief     Status of current problems: unchanged  Labs Ordered: No orders of the defined types were placed in this encounter.   Labs Reviewed: n/a  Collateral Obtained/Records Reviewed: n/a  Plan:  Xanax 0.5 mg 3 times daily as needed Prozac 10 mg daily Discontinue Wellbutrin; patient has already stopped the medicine Melatonin 1 mg nightly rtc 6-8 weeks  Aundra Dubin, MD 12/24/2017, 12:40 PM

## 2017-12-25 ENCOUNTER — Ambulatory Visit (INDEPENDENT_AMBULATORY_CARE_PROVIDER_SITE_OTHER): Payer: Medicare Other | Admitting: Podiatry

## 2017-12-25 ENCOUNTER — Encounter: Payer: Self-pay | Admitting: Podiatry

## 2017-12-25 DIAGNOSIS — M79676 Pain in unspecified toe(s): Secondary | ICD-10-CM | POA: Diagnosis not present

## 2017-12-25 DIAGNOSIS — B351 Tinea unguium: Secondary | ICD-10-CM | POA: Diagnosis not present

## 2017-12-25 DIAGNOSIS — Q828 Other specified congenital malformations of skin: Secondary | ICD-10-CM | POA: Diagnosis not present

## 2017-12-25 NOTE — Progress Notes (Signed)
Complaint:  Visit Type: Patient returns to my office for continued preventative foot care services. Complaint: Patient states" my nails have grown long and thick and become painful to walk and wear shoes" . The patient presents for preventative foot care services. No changes to ROS.  Patient has painful callus both feet.  Podiatric Exam: Vascular: dorsalis pedis and posterior tibial pulses are palpable bilateral. Capillary return is immediate. Temperature gradient is WNL. Skin turgor WNL  Sensorium: Normal Semmes Weinstein monofilament test. Normal tactile sensation bilaterally. Nail Exam: Pt has thick disfigured discolored nails with subungual debris noted bilateral entire nail hallux through fifth toenails Ulcer Exam: There is no evidence of ulcer or pre-ulcerative changes or infection. Orthopedic Exam: Muscle tone and strength are WNL. No limitations in general ROM. No crepitus or effusions noted. Foot type and digits show no abnormalities. Bony prominences are unremarkable. Skin:  Porokeratosis sub 1 left and sub 5 left and sub 1,4 right..   No infection or ulcers  Diagnosis:  Onychomycosis, , Pain in right toe, pain in left toes,  Porokeratosis  Treatment & Plan Procedures and Treatment: Consent by patient was obtained for treatment procedures.   Debridement of mycotic and hypertrophic toenails, 1 through 5 bilateral and clearing of subungual debris. No ulceration, no infection noted. Debride porokeratosis  B/L  ABN signed for 1019. Rick prepared diabetic insoles to help her calluses not be so painful. Return Visit-Office Procedure: Patient instructed to return to the office for a follow up visit 3 months for continued evaluation and treatment.    Gardiner Barefoot DPM

## 2017-12-26 DIAGNOSIS — M7541 Impingement syndrome of right shoulder: Secondary | ICD-10-CM | POA: Diagnosis not present

## 2017-12-26 DIAGNOSIS — M25511 Pain in right shoulder: Secondary | ICD-10-CM | POA: Diagnosis not present

## 2017-12-31 NOTE — Progress Notes (Signed)
Tracy Malone was seen today in the movement disorders clinic for neurologic consultation at the request of [name], MD.  The consultation is for the evaluation of R hand tremor.  Pt states that it started in November.  It seemed to come on abruptly but she doesn't remember.  It seems to come and go but overall hasn't gotten worse.  She notes it the most with eating but it is also is noted at rest.  She is right hand dominant.  She never notes it in the left hand or legs.  No family hx of tremor.  The records that were made available to me were reviewed.  This patient is accompanied in the office by her spouse who supplements the history.    Tremor: Yes.     Affected by caffeine:  No. (rarely drinks coke)  Affected by alcohol: doesn't drink alcohol  Affected by stress:  Yes.    Affected by fatigue:  Yes.    Spills soup if on spoon:  No.  Spills glass of liquid if full:  No.   Other sx's: Voice: no change per pt (she thinks that she has had to try to speak louder due to her husbands HOH) Sleep: trouble getting to sleep and trouble staying asleep  Vivid Dreams:  No.  Acting out dreams:  No. Wet Pillows: No. Postural symptoms:  No. (not unless takes restoril for sleep)  Falls?  No. Bradykinesia symptoms: trouble getting out of bed; walks slower than in the past and attributes to R knee pain after injury Loss of smell:  Unsure - states that she keeps smelling a chemical Loss of taste:  No. Urinary Incontinence:  No. Difficulty Swallowing:  Yes.   with pills Handwriting, micrographia: Yes.   Trouble with ADL's:  No.   Trouble buttoning clothing: No. Depression:  No., but admits to anxiety Memory changes:  No. Hallucinations:  No.  visual distortions: No. N/V:  No. Lightheaded:  No.  Syncope: No. Diplopia:  No. Dyskinesia:  No.  Neuroimaging has not previously been performed.   04/05/17 update:  I have not seen this patient in over 2 years.  The records that were made  available to me were reviewed.  She is accompanied by her husband and daughter, who supplement the history.  I have spoken to her psychiatrist.  I felt that she likely had early PD at our last visit.  I recommended carbidopa/levodopa 25/100.  She did not take this and transferred care to [name].  He felt that she did not have PD but rather ET.  Xanax was recommended for her tremor.  When she last saw [name], on 03/16/17, he started her on propranolol LA, 60 mg because of increased tremor. This did help some.   She has been seeing Dr. [name] at [facility] and he felt that she had sx's consistent with PD and recommend she come to see me.  She states that she has had no falls.  She states that she has normal balance but states that she has back pain today and that has caused some trouble.  No dreams.  No acting out of the dreams.  Handwriting is very little.  Still having trouble with smelling things that are not there.  No visual distortions.  No hallucinations.  No trouble with ADL's/buttoning clothing.  Doing okay with swallowing.  Taking xanax 0.5 mg tid.  Still with depression.  Holding Lowgap for now.  Independent of depression, she  has crying spells.  09/21/17 update: Patient is seen back in follow-up today.  Many records reviewed since our last visit.  Pt accompanied by daughter and husband who supplements the history.  I started her on levodopa last visit.  Her daughter called only a few days after her visit to state that she took 1/2 tablet and developed burning and swelling in the lip/mouth.  She called her pharmacist and told her to stop taking the medication.  I gave her a new titration schedule that was very slow and they reported that even a half a tablet once per day caused red and puffy lips and swelling in the mouth.  We discussed that there really are not other options in her age group and that I did not think that this was likely a side effect of levodopa.  Her daughter reported that  depression was a bigger issue and they wanted to hold on further attempts with levodopa.  She has been to psychiatry.  Numerous records have been reviewed.  She started ECT treatments for depression.  That seemed to help some, but she developed some memory change after 3 treatments and decided to hold.  She remains faithful with follow-up with psychiatry.  Trazodone was started but she reports it caused blurred vision and she d/c it and started wellbutrin last week.  She saw ophthalmology and was told she just had dry eye.  Her Xanax was changed to lorazepam.  The patient has been in contact with [name] via email since our last visit.  She reported that she needed to see him quickly because she was told that she had Parkinson's disease.  One fall since our visit where she fell off poorly inflated exercise ball.  Daughter asks me about the crying spells independent of depression.  01/01/18 update: Patient is seen today for Parkinson's.  She is accompanied by her family who supplements the history.  The patient was given carbidopa/levodopa 25/100 CR last visit and told to slowly work up to 1 tablet 3 times per day.  Not surprisingly, she was not able to tolerate this either.  She had previously reported facial paresthesias with levodopa (as with many other medications) and I told her I did not think it was related to levodopa.  This time, she reports that taking it more than once per day causes headache.  Records have been reviewed extensively since last visit.  She just saw [name] on Nov 28, 2017.  He thought it was possible that the patient had Parkinson's disease and also told the patient to increase her medication to 3 times per day dosing (taking at 9am/3pm/9pm).  She states that she did that and has been on that for about 1 week.  "the only thing that I have noted is my walking is better.  I need to do something about the shaking of my hand."   She emailed [name] back asking him for another opinion at  [facility] or [facility].  She has been seeing psychiatry.  He noted her pre-occupation with the dx of PD.  She was started on prozac.  PREVIOUS MEDICATIONS: none to date  ALLERGIES:   Allergies  Allergen Reactions  . Iodine Anaphylaxis    IV and topical forms.  Burnard Leigh [Hyoscyamine Sulfate]     Vision problems/pt has glaucoma  . Salmon [Fish Allergy] Hives and Shortness Of Breath  . Shellfish Allergy Anaphylaxis  . Remeron [Mirtazapine] Other (See Comments)    Cause blurred vision  and red eyes, pt has glaucoma  . Aspirin Other (See Comments)    Sever stomach pain due to ulcer scaring.  . Ciprofloxacin Diarrhea  . Codeine Nausea And Vomiting  . Darvocet [Propoxyphene N-Acetaminophen] Nausea And Vomiting  . Demerol [Meperidine] Nausea Only  . Dexilant [Dexlansoprazole] Swelling    Redness, swelling and peeling of both feet.  . Diphedryl [Diphenhydramine] Other (See Comments)    Increased pulse/small amount ok  . Doxycycline Hyclate Other (See Comments)    GI intolerance.  Marland Kitchen Epinephrine Other (See Comments)    Breathing problems  . Erythromycin Other (See Comments)    GI intolerance.  Yvette Rack [Cyclobenzaprine] Other (See Comments)    Tingly/prickly sensation.  Marland Kitchen Keflex [Cephalexin] Hives  . Latex Other (See Comments)    Gloves ok.  Skin gets red from elastic in underwear and latex bandaides.  . Prednisone Other (See Comments)    Headache  . Pylera [Bis Subcit-Metronid-Tetracyc] Swelling    Tongue swelling. Face tingling  . Sulfa Antibiotics Other (See Comments)    Increased pulse, fainting, diarrhea, thrush  . Xylocaine [Lidocaine Hcl]     With epinephrine, given by dentist.  Speeded up heart rate and she passed out (occured twice, at dentist)  . Zoloft [Sertraline Hcl] Swelling and Other (See Comments)    Migraine Swelling of tongue/lip (09/2012)  . Advil [Ibuprofen] Other (See Comments)    Motrin ok with a GI effect.  . Clarithromycin Rash    Started after completing  10 day course of 2000 mg /day    CURRENT MEDICATIONS:  Outpatient Encounter Medications as of 01/01/2018  Medication Sig  . acetaminophen (TYLENOL) 650 MG CR tablet Take 650 mg by mouth as needed for pain. Reported on 09/06/2015  . ALPRAZolam (XANAX) 0.5 MG tablet Use up to 3 times daily.  Warning: benzodiazepines increase the risk of falls and neurocognitive decline in the elderly  . b complex vitamins capsule Take 1 capsule by mouth as needed.   . Carbidopa-Levodopa ER (SINEMET CR) 25-100 MG tablet controlled release Take 1 tablet by mouth 3 (three) times daily.  . Cholecalciferol (VITAMIN D) 2000 UNITS tablet Take 2,000 Units by mouth daily.  Marland Kitchen FLUoxetine (PROZAC) 10 MG capsule Take 1 capsule (10 mg total) by mouth daily.  . Probiotic Product (ALIGN PO) Take 1 capsule by mouth every other day.   Marland Kitchen SYNTHROID 25 MCG tablet Take 1 tablet (25 mcg total) by mouth daily before breakfast.  . vitamin E 400 UNIT capsule Take 400 Units by mouth every Monday, Wednesday, and Friday. Reported on 09/06/2015   No facility-administered encounter medications on file as of 01/01/2018.     PAST MEDICAL HISTORY:   Past Medical History:  Diagnosis Date  . Bell's palsy 1966   ? right side facial droop  . Carotid artery disease ([place]) 2010   on vascular screening;unchanged 2013.(could not tolerate simvastatin, no other statins tried)--<30% blockage bilat 07/2011  . Chronic abdominal pain   . Chronic fatigue and malaise   . Claustrophobia   . Depression    treated in the past for years;stopped in 2010 for a years  . Duodenal ulcer 1962   h/o  . Dysrhythmia    ocassional PVC's  . Fibromyalgia   . Frequent PVCs 07/2012   Seen by  Cards: benign, asymptomatic, normal EF  . GERD (gastroesophageal reflux disease)   . Glaucoma, narrow-angle    s/p laser surgery  . History of hiatal hernia    during  endoscopy  . Hypothyroid 9/08  . IBS (irritable bowel syndrome)    Dr. Benson Norway  . Ocular migraine   .  Osteoporosis 10/11   Dr.Hawkes  . Panic attack   . Recurrent UTI    has cystocele-Dr.Grewal  . Shingles 1999   h/o  . Superficial thrombophlebitis 03/2009   RLE  . Trochanteric bursitis 12/2008   bilateral    PAST SURGICAL HISTORY:   Past Surgical History:  Procedure Laterality Date  . ABDOMINAL HYSTERECTOMY    . CATARACT EXTRACTION, BILATERAL  1995, 1996  . Flexible sigmoidoscopy    . THYROIDECTOMY, PARTIAL  09/2005   L nodule; Dr. Harlow Asa  . TONSILLECTOMY  1946  . UPPER GI ENDOSCOPY  06/27/12  . VAGINAL HYSTERECTOMY  1971   and bladder repair.  Still has ovaries    SOCIAL HISTORY:   Social History   Socioeconomic History  . Marital status: Married    Spouse name: Not on file  . Number of children: 2  . Years of education: Not on file  . Highest education level: Not on file  Occupational History  . Occupation: retired (school system)  Social Needs  . Financial resource strain: Not on file  . Food insecurity:    Worry: Not on file    Inability: Not on file  . Transportation needs:    Medical: Not on file    Non-medical: Not on file  Tobacco Use  . Smoking status: Never Smoker  . Smokeless tobacco: Never Used  Substance and Sexual Activity  . Alcohol use: No    Alcohol/week: 0.0 oz  . Drug use: No  . Sexual activity: Never  Lifestyle  . Physical activity:    Days per week: Not on file    Minutes per session: Not on file  . Stress: Not on file  Relationships  . Social connections:    Talks on phone: Not on file    Gets together: Not on file    Attends religious service: Not on file    Active member of club or organization: Not on file    Attends meetings of clubs or organizations: Not on file    Relationship status: Not on file  . Intimate partner violence:    Fear of current or ex partner: Not on file    Emotionally abused: Not on file    Physically abused: Not on file    Forced sexual activity: Not on file  Other Topics Concern  . Not on file    Social History Narrative   Married.  Son lives in [place]; Daughter [name] lives in [place]; 2 grandchildren    FAMILY HISTORY:   Family Status  Relation Name Status  . Mother  Deceased at age 58       MI  . Father  Deceased at age 22       arteriosclerosis  . Sister 2 Alive       2, alive and well  . Brother  Deceased at age 18       "natural causes"--had PTSD  . Daughter  Alive  . Son  Alive  . Brother  Deceased       CAD  . Brother  Deceased       lung CA  . MGF  (Not Specified)    ROS:  A complete 10 system review of systems was obtained and was unremarkable apart from what is mentioned above.  PHYSICAL EXAMINATION:    VITALS:   Vitals:  01/01/18 1041  BP: 140/74  Pulse: 81  SpO2: 98%  Weight: 107 lb 4 oz (48.6 kg)  Height: '4\' 11"'$  (1.499 m)    GEN:  The patient appears stated age and is in NAD.  Intermittently tearful and crying (appropriately) HEENT:  Normocephalic, atraumatic.  The mucous membranes are moist. The superficial temporal arteries are without ropiness or tenderness. CV:  RRR Lungs:  CTAB Neck/HEME:  There are no carotid bruits bilaterally.  Neurological examination:  Orientation: The patient is alert and oriented x3. Cranial nerves: There is good facial symmetry. The speech is fluent and clear. Soft palate rises symmetrically and there is no tongue deviation. Hearing is intact to conversational tone. Sensation: Sensation is intact to light touch throughout Motor: Strength is 5/5 in the bilateral upper and lower extremities.   Shoulder shrug is equal and symmetric.  There is no pronator drift.  Movement examination: Tone: There is normal tone in the UE/LE Abnormal movements: There is a near constant RUE resting tremor that increases with distraction procedures.  Coordination:  There is no decremation, with any form of RAMS, including alternating supination and pronation of the forearm, hand opening and closing, finger taps, heel taps and toe  taps. Gait and Station: She is able to arise out of the chair without the use of her hands.  She is no longer dragging the right leg.  Is just slightly short stepped.    Labs:  Lab Results  Component Value Date   TSH 1.810 10/04/2017     ASSESSMENT/PLAN:  1.  Idiopathic Parkinson's disease.  The patient has tremor, bradykinesia, rigidity and mild postural instability.  -I had a long discussion with the patient today.  She was seen by me in 2016 and diagnosed with Parkinson's disease.  She transferred care to [name] who felt that she did not have Parkinson's disease, but rather essential tremor.  She then returned back to our office in 2018, but has been going back and forth for care, despite long discussions about the fact that this is not good for her care.  We discussed this last visit but she has continued this pattern.  We discussed that she certainly can stay with [name] and she has a follow-up with him in August.  She was upset and stated that she wanted follow-up here.  I explained to her that I have no issues with second opinions or even third opinions, but when she is going back and forth for care, she is getting differing opinions that ultimately are not good for her.  -Patient has many intolerances to medications.  She has reported facial paresthesias with levodopa in the past.  She then reported headache with levodopa.   Neither of these are generally side effects of levodopa and I think that the patients biggest issue is anxiety.  She was ultimately able to get up on 3 times daily levodopa, but is spreading it too far apart.  I told her to take 1 tablet at 9 AM/1 PM/4 PM.  Discussed dietary issues with levodopa.  Understanding was expressed.    -She may have a component of levodopa resistant tremor, but it has helped other symptoms.  I told her that I would not recommend adding further medication just for tremor.  -asked me about propranolol and I would not recommend  -talked to  her about importance of exercise.  She is having difficulty with this because of pains in the shoulder, knee and ankle.  This eliminates most  forms of exercise, but talked about things like yoga and tai chi.  2.  PBA   -It certainly is possible that the patient has pseudobulbar affect in addition to depression.  3.  Depression/anxiety  -Patient is faithfully following with psychiatry.  I think this is likely her biggest issue  -I would like to see her off of the Xanax, but certainly will leave this to the care of psychiatry.  They have just started Prozac.  I believe that she likely could benefit from the addition of a counselor as well.  4.  Follow-up in 4 months, sooner should new issues arise.  Much greater than 50% of this visit was spent in counseling and coordinating care.  Total face to face time:  25 min

## 2018-01-01 ENCOUNTER — Encounter: Payer: Self-pay | Admitting: Neurology

## 2018-01-01 ENCOUNTER — Ambulatory Visit (INDEPENDENT_AMBULATORY_CARE_PROVIDER_SITE_OTHER): Payer: Medicare Other | Admitting: Neurology

## 2018-01-01 VITALS — BP 140/74 | HR 81 | Ht 59.0 in | Wt 107.2 lb

## 2018-01-01 DIAGNOSIS — F331 Major depressive disorder, recurrent, moderate: Secondary | ICD-10-CM | POA: Diagnosis not present

## 2018-01-01 DIAGNOSIS — G20 Parkinson's disease: Secondary | ICD-10-CM | POA: Diagnosis not present

## 2018-01-01 DIAGNOSIS — G20A1 Parkinson's disease without dyskinesia, without mention of fluctuations: Secondary | ICD-10-CM

## 2018-01-01 NOTE — Patient Instructions (Signed)
Registration is OPEN!    Third Annual Parkinson's Education Symposium   To register: [email or web address]      Search:  Parkinson's Symposium  Register EACH person attending individually Questions: Contact Jessica Thomas, LCSW  [phone] or [email or web address]    [facility] Parkinson's & Movement Disorders  The [facility] team know that living well with a movement disorder extends far beyond our clinic walls. We are together with you. Our team is passionate about providing resources to you and your loved ones who are living with Parkinson's disease and movement disorders. Participate in these programs and join our community. These resources are free or low cost!    Parkinson's and Movement Disorders Program is adding:   Innovative educational programs for patients and caregivers.   Support groups for patients and caregivers living with Parkinson's disease.   Parkinson's specific exercise programs.   Custom tailored therapeutic programs that will benefit patient's living with Parkinson's disease.   We are in this together. You can help and contribute to grow these programs and resources in our community. 100% of the funds donated to the Movement Disorders Fund stays right here in our community to support patients and their caregivers.  To make a tax deductible contribution:  -ask for a [facility] Parkinson's envelope in the office today.  - call the Office of Institutional Advancement at [phone].      

## 2018-01-03 ENCOUNTER — Ambulatory Visit (INDEPENDENT_AMBULATORY_CARE_PROVIDER_SITE_OTHER): Payer: Medicare Other | Admitting: Psychiatry

## 2018-01-03 DIAGNOSIS — F411 Generalized anxiety disorder: Secondary | ICD-10-CM | POA: Diagnosis not present

## 2018-01-07 ENCOUNTER — Encounter: Payer: Self-pay | Admitting: Neurology

## 2018-01-07 ENCOUNTER — Encounter: Payer: Self-pay | Admitting: Family Medicine

## 2018-01-09 ENCOUNTER — Ambulatory Visit: Payer: Medicare Other | Admitting: Family Medicine

## 2018-01-10 ENCOUNTER — Ambulatory Visit (INDEPENDENT_AMBULATORY_CARE_PROVIDER_SITE_OTHER): Payer: Medicare Other | Admitting: Psychiatry

## 2018-01-10 DIAGNOSIS — F411 Generalized anxiety disorder: Secondary | ICD-10-CM | POA: Diagnosis not present

## 2018-01-14 ENCOUNTER — Encounter: Payer: Self-pay | Admitting: Family Medicine

## 2018-01-21 ENCOUNTER — Encounter (HOSPITAL_COMMUNITY): Payer: Self-pay | Admitting: Psychiatry

## 2018-01-23 ENCOUNTER — Encounter: Payer: Self-pay | Admitting: Family Medicine

## 2018-01-23 ENCOUNTER — Ambulatory Visit (INDEPENDENT_AMBULATORY_CARE_PROVIDER_SITE_OTHER): Payer: Medicare Other | Admitting: Family Medicine

## 2018-01-23 VITALS — BP 110/60 | HR 65 | Temp 97.6°F | Ht 59.0 in | Wt 106.6 lb

## 2018-01-23 DIAGNOSIS — I879 Disorder of vein, unspecified: Secondary | ICD-10-CM | POA: Diagnosis not present

## 2018-01-23 DIAGNOSIS — R739 Hyperglycemia, unspecified: Secondary | ICD-10-CM

## 2018-01-23 LAB — POCT GLYCOSYLATED HEMOGLOBIN (HGB A1C): HEMOGLOBIN A1C: 5.5 % (ref 4.0–5.6)

## 2018-01-23 LAB — POCT CBG (FASTING - GLUCOSE)-MANUAL ENTRY: GLUCOSE FASTING, POC: 87 mg/dL (ref 70–99)

## 2018-01-23 NOTE — Patient Instructions (Signed)
  Your blood sugar is completely fine. I suspect that your monitor or test strips are not accurate.  DO NOT WORRY.  I recommend a trial of compression socks to help with your burning discomfort in your lower legs.   [employer] (a scrub store) carries a nice selection. [address] Try just one pair, to see how they feel. It is much less pressure than what you got from Dr. Aleda Grana.   Put them on in the morning, before you develop any discomfort.

## 2018-01-23 NOTE — Progress Notes (Signed)
Chief Complaint  Patient presents with  . Hyperglycemia   Patient presents with concern over high blood sugars at home.  She is complaining of burning in her lower legs, which made her worry that she could have diabetes. She checked her blood sugar with her daughter's machine and it was high.  183 the first time, 221, 206.  This morning was 266.  The machine and test strips were admittedly very old. She denies polydipsia, polyuria.  She is complaining of lower legs burning, and the feet turning very red when she is sitting.  She has previously seen Dr. Aleda Grana, had ultrasound, told everything was okay.  Given compression stockings (thigh high), but are too hard to put on.  PMH, PSH, SH reviewed  Outpatient Encounter Medications as of 01/23/2018  Medication Sig Note  . acetaminophen (TYLENOL) 650 MG CR tablet Take 650 mg by mouth as needed for pain. Reported on 09/06/2015   . ALPRAZolam (XANAX) 0.5 MG tablet Use up to 3 times daily.  Warning: benzodiazepines increase the risk of falls and neurocognitive decline in the elderly   . b complex vitamins capsule Take 1 capsule by mouth as needed.  10/26/2016: daily  . Carbidopa-Levodopa ER (SINEMET CR) 25-100 MG tablet controlled release Take 1 tablet by mouth 3 (three) times daily. 11/05/2017: 1 daily  . Cholecalciferol (VITAMIN D) 2000 UNITS tablet Take 2,000 Units by mouth daily. 05/04/2015: Taking daily  . Probiotic Product (ALIGN PO) Take 1 capsule by mouth every other day.    Marland Kitchen SYNTHROID 25 MCG tablet Take 1 tablet (25 mcg total) by mouth daily before breakfast.   . vitamin E 400 UNIT capsule Take 400 Units by mouth every Monday, Wednesday, and Friday. Reported on 09/06/2015   . FLUoxetine (PROZAC) 10 MG capsule Take 1 capsule (10 mg total) by mouth daily. (Patient not taking: Reported on 01/23/2018)    No facility-administered encounter medications on file as of 01/23/2018.    Allergies  Allergen Reactions  . Iodine Anaphylaxis    IV and  topical forms.  Burnard Leigh [Hyoscyamine Sulfate]     Vision problems/pt has glaucoma  . Salmon [Fish Allergy] Hives and Shortness Of Breath  . Shellfish Allergy Anaphylaxis  . Remeron [Mirtazapine] Other (See Comments)    Cause blurred vision and red eyes, pt has glaucoma  . Aspirin Other (See Comments)    Sever stomach pain due to ulcer scaring.  . Ciprofloxacin Diarrhea  . Codeine Nausea And Vomiting  . Darvocet [Propoxyphene N-Acetaminophen] Nausea And Vomiting  . Demerol [Meperidine] Nausea Only  . Dexilant [Dexlansoprazole] Swelling    Redness, swelling and peeling of both feet.  . Diphedryl [Diphenhydramine] Other (See Comments)    Increased pulse/small amount ok  . Doxycycline Hyclate Other (See Comments)    GI intolerance.  Marland Kitchen Epinephrine Other (See Comments)    Breathing problems  . Erythromycin Other (See Comments)    GI intolerance.  Yvette Rack [Cyclobenzaprine] Other (See Comments)    Tingly/prickly sensation.  Marland Kitchen Keflex [Cephalexin] Hives  . Latex Other (See Comments)    Gloves ok.  Skin gets red from elastic in underwear and latex bandaides.  . Prednisone Other (See Comments)    Headache  . Pylera [Bis Subcit-Metronid-Tetracyc] Swelling    Tongue swelling. Face tingling  . Sulfa Antibiotics Other (See Comments)    Increased pulse, fainting, diarrhea, thrush  . Xylocaine [Lidocaine Hcl]     With epinephrine, given by dentist.  Speeded up heart rate and she  passed out (occured twice, at dentist)  . Zoloft [Sertraline Hcl] Swelling and Other (See Comments)    Migraine Swelling of tongue/lip (09/2012)  . Advil [Ibuprofen] Other (See Comments)    Motrin ok with a GI effect.  . Clarithromycin Rash    Started after completing 10 day course of 2000 mg /day   ROS: no fever, chills, URI symptoms, polydipsia, polyuria, vision changes, bleeding, bruising, rash. +burning in legs, +chronic pain, +depression/anxiety, unchanged.   PHYSICAL EXAM: BP 110/60   Pulse 65   Temp  97.6 F (36.4 C) (Oral)   Ht 4\' 11"  (1.499 m)   Wt 106 lb 9.6 oz (48.4 kg)   SpO2 94%   BMI 21.53 kg/m   Well appearing, elderly female, accompanied by her husband, and they both appear to be in good spirits today HEENT: conjunctiva and sclera are clear, EOMI Extremities: Normal pulses, no edema. Superficial veins noted bilaterally, most prominent on the dorsum of the feet, R>L.  Somewhat red and violaceous when seated--after just a minute of putting legs straight out on exam table, color improved to normal (other than the superficial veins). No varicosities noted. Calves nontender  Fasting glucose 87 A1c 5.5  ASSESSMENT/PLAN:  Elevated blood sugar - reassured that blood sugars are normal, no evidence of diabetes.  Tests strips and/or monitor she used were NOT ACCURATE - Plan: Glucose (CBG), Fasting, HgB A1c  Venous disease - reassured no DVT; mainly superficial varicosities. Encouraged use of support socks--may tolerate knee-high socks better than stockings. Elevate legs when able    Your blood sugar is completely fine. I suspect that your monitor or test strips are not accurate.  DO NOT WORRY.  I recommend a trial of compression socks to help with your burning discomfort in your lower legs.   [employer] (a scrub store) carries a nice selection. [address]. Try just one pair, to see how they feel. It is much less pressure than what you got from Dr. Aleda Grana.   Put them on in the morning, before you develop any discomfort.

## 2018-01-28 DIAGNOSIS — I8312 Varicose veins of left lower extremity with inflammation: Secondary | ICD-10-CM | POA: Diagnosis not present

## 2018-01-28 DIAGNOSIS — I8311 Varicose veins of right lower extremity with inflammation: Secondary | ICD-10-CM | POA: Diagnosis not present

## 2018-01-28 DIAGNOSIS — M79604 Pain in right leg: Secondary | ICD-10-CM | POA: Diagnosis not present

## 2018-01-28 DIAGNOSIS — M79605 Pain in left leg: Secondary | ICD-10-CM | POA: Diagnosis not present

## 2018-01-30 ENCOUNTER — Encounter: Payer: Self-pay | Admitting: Family Medicine

## 2018-01-30 ENCOUNTER — Encounter: Payer: Self-pay | Admitting: Neurology

## 2018-02-04 ENCOUNTER — Other Ambulatory Visit: Payer: Self-pay | Admitting: Obstetrics and Gynecology

## 2018-02-05 ENCOUNTER — Ambulatory Visit: Payer: Medicare Other | Admitting: Psychiatry

## 2018-02-07 DIAGNOSIS — M940 Chondrocostal junction syndrome [Tietze]: Secondary | ICD-10-CM | POA: Diagnosis not present

## 2018-02-13 ENCOUNTER — Encounter: Payer: Self-pay | Admitting: Neurology

## 2018-02-13 ENCOUNTER — Encounter: Payer: Self-pay | Admitting: Family Medicine

## 2018-02-14 ENCOUNTER — Other Ambulatory Visit: Payer: Self-pay | Admitting: Neurology

## 2018-02-17 ENCOUNTER — Other Ambulatory Visit (HOSPITAL_COMMUNITY): Payer: Self-pay | Admitting: Psychiatry

## 2018-02-17 DIAGNOSIS — F332 Major depressive disorder, recurrent severe without psychotic features: Secondary | ICD-10-CM

## 2018-02-18 DIAGNOSIS — I8311 Varicose veins of right lower extremity with inflammation: Secondary | ICD-10-CM | POA: Diagnosis not present

## 2018-02-18 DIAGNOSIS — I8312 Varicose veins of left lower extremity with inflammation: Secondary | ICD-10-CM | POA: Diagnosis not present

## 2018-02-19 ENCOUNTER — Ambulatory Visit (INDEPENDENT_AMBULATORY_CARE_PROVIDER_SITE_OTHER): Payer: Medicare Other | Admitting: Psychiatry

## 2018-02-19 DIAGNOSIS — F411 Generalized anxiety disorder: Secondary | ICD-10-CM

## 2018-02-21 DIAGNOSIS — M7061 Trochanteric bursitis, right hip: Secondary | ICD-10-CM | POA: Diagnosis not present

## 2018-02-21 DIAGNOSIS — M7062 Trochanteric bursitis, left hip: Secondary | ICD-10-CM | POA: Diagnosis not present

## 2018-02-21 DIAGNOSIS — M25551 Pain in right hip: Secondary | ICD-10-CM | POA: Diagnosis not present

## 2018-02-25 ENCOUNTER — Encounter (HOSPITAL_COMMUNITY): Payer: Self-pay | Admitting: Psychiatry

## 2018-02-25 ENCOUNTER — Ambulatory Visit (INDEPENDENT_AMBULATORY_CARE_PROVIDER_SITE_OTHER): Payer: Medicare Other | Admitting: Psychiatry

## 2018-02-25 DIAGNOSIS — F4329 Adjustment disorder with other symptoms: Secondary | ICD-10-CM

## 2018-02-25 DIAGNOSIS — F332 Major depressive disorder, recurrent severe without psychotic features: Secondary | ICD-10-CM

## 2018-02-25 DIAGNOSIS — Z634 Disappearance and death of family member: Secondary | ICD-10-CM | POA: Diagnosis not present

## 2018-02-25 DIAGNOSIS — G20 Parkinson's disease: Secondary | ICD-10-CM | POA: Diagnosis not present

## 2018-02-25 DIAGNOSIS — G20A1 Parkinson's disease without dyskinesia, without mention of fluctuations: Secondary | ICD-10-CM

## 2018-02-25 DIAGNOSIS — F4381 Prolonged grief disorder: Secondary | ICD-10-CM

## 2018-02-25 DIAGNOSIS — F4321 Adjustment disorder with depressed mood: Secondary | ICD-10-CM

## 2018-02-25 NOTE — Progress Notes (Signed)
[facility] MD/PA/NP OP Progress Note  02/25/2018 10:30 AM Tracy Malone  MRN:  [id number]  Chief Complaint: feel better off prozac  HPI: Tracy Malone reports that she feels better off the Prozac.  She thinks that the titration of Sinemet has helped with her mood and outlook as well.  She is now taking 3 of the Sinemet capsules per day.  She feels like she is moving around a little better as well and her writing is getting better.  She uses the Xanax about 2-3 times a day, is sleeping okay at night.  We discussed focusing on therapy, and revisiting the idea of [place].  Educated her on additional modalities of [place] including the brains way machine, which may provide some broader stimulation.  She wants to hold off on [place] for now but revisit if needed in the future.  No suicidality or unsafe thoughts.  Visit Diagnosis:    ICD-10-CM   1. Major depressive disorder, recurrent, severe without psychotic features ([place]) F33.2   2. Complicated grief P71.06    Z63.4   3. Parkinson's disease ([place]) [place]     Past Psychiatric History: See intake H&P for full details. Reviewed, with no updates at this time.   Past Medical History:  Past Medical History:  Diagnosis Date  . Bell's palsy 1966   ? right side facial droop  . Carotid artery disease ([place]) 2010   on vascular screening;unchanged 2013.(could not tolerate simvastatin, no other statins tried)--<30% blockage bilat 07/2011  . Chronic abdominal pain   . Chronic fatigue and malaise   . Claustrophobia   . Depression    treated in the past for years;stopped in 2010 for a years  . Duodenal ulcer 1962   h/o  . Dysrhythmia    ocassional PVC's  . Fibromyalgia   . Frequent PVCs 07/2012   Seen by [facility]: benign, asymptomatic, normal EF  . GERD (gastroesophageal reflux disease)   . Glaucoma, narrow-angle    s/p laser surgery  . History of hiatal hernia    during endoscopy  . Hypothyroid 9/08  . IBS (irritable bowel syndrome)    Dr. Benson Norway  . Ocular  migraine   . Osteoporosis 10/11   Dr.Hawkes  . Panic attack   . Recurrent UTI    has cystocele-Dr.Grewal  . Shingles 1999   h/o  . Superficial thrombophlebitis 03/2009   RLE  . Trochanteric bursitis 12/2008   bilateral    Past Surgical History:  Procedure Laterality Date  . ABDOMINAL HYSTERECTOMY    . CATARACT EXTRACTION, BILATERAL  1995, 1996  . Flexible sigmoidoscopy    . THYROIDECTOMY, PARTIAL  09/2005   L nodule; Dr. Harlow Asa  . TONSILLECTOMY  1946  . UPPER GI ENDOSCOPY  06/27/12  . VAGINAL HYSTERECTOMY  1971   and bladder repair.  Still has ovaries    Family Psychiatric History: See intake H&P for full details. Reviewed, with no updates at this time.   Family History:  Family History  Problem Relation Age of Onset  . Heart disease Mother   . Hypertension Mother   . Hypertension Sister   . HIV Son   . Heart disease Brother   . Lung cancer Brother        lung  . Diabetes Maternal Grandfather     Social History:  Social History   Socioeconomic History  . Marital status: Married    Spouse name: Not on file  . Number of children: 2  . Years  of education: Not on file  . Highest education level: Not on file  Occupational History  . Occupation: retired (school system)  Social Needs  . Financial resource strain: Not on file  . Food insecurity:    Worry: Not on file    Inability: Not on file  . Transportation needs:    Medical: Not on file    Non-medical: Not on file  Tobacco Use  . Smoking status: Never Smoker  . Smokeless tobacco: Never Used  Substance and Sexual Activity  . Alcohol use: No    Alcohol/week: 0.0 oz  . Drug use: No  . Sexual activity: Never  Lifestyle  . Physical activity:    Days per week: Not on file    Minutes per session: Not on file  . Stress: Not on file  Relationships  . Social connections:    Talks on phone: Not on file    Gets together: Not on file    Attends religious service: Not on file    Active member of club or  organization: Not on file    Attends meetings of clubs or organizations: Not on file    Relationship status: Not on file  Other Topics Concern  . Not on file  Social History Narrative   Married.  Son lives in [place]; Daughter [name] lives in [place]; 2 grandchildren    Allergies:  Allergies  Allergen Reactions  . Iodine Anaphylaxis    IV and topical forms.  Burnard Leigh [Hyoscyamine Sulfate]     Vision problems/pt has glaucoma  . Salmon [Fish Allergy] Hives and Shortness Of Breath  . Shellfish Allergy Anaphylaxis  . Remeron [Mirtazapine] Other (See Comments)    Cause blurred vision and red eyes, pt has glaucoma  . Aspirin Other (See Comments)    Sever stomach pain due to ulcer scaring.  . Ciprofloxacin Diarrhea  . Codeine Nausea And Vomiting  . Darvocet [Propoxyphene N-Acetaminophen] Nausea And Vomiting  . Demerol [Meperidine] Nausea Only  . Dexilant [Dexlansoprazole] Swelling    Redness, swelling and peeling of both feet.  . Diphedryl [Diphenhydramine] Other (See Comments)    Increased pulse/small amount ok  . Doxycycline Hyclate Other (See Comments)    GI intolerance.  Marland Kitchen Epinephrine Other (See Comments)    Breathing problems  . Erythromycin Other (See Comments)    GI intolerance.  Yvette Rack [Cyclobenzaprine] Other (See Comments)    Tingly/prickly sensation.  Marland Kitchen Keflex [Cephalexin] Hives  . Latex Other (See Comments)    Gloves ok.  Skin gets red from elastic in underwear and latex bandaides.  . Prednisone Other (See Comments)    Headache  . Pylera [Bis Subcit-Metronid-Tetracyc] Swelling    Tongue swelling. Face tingling  . Sulfa Antibiotics Other (See Comments)    Increased pulse, fainting, diarrhea, thrush  . Xylocaine [Lidocaine Hcl]     With epinephrine, given by dentist.  Speeded up heart rate and she passed out (occured twice, at dentist)  . Zoloft [Sertraline Hcl] Swelling and Other (See Comments)    Migraine Swelling of tongue/lip (09/2012)  . Advil [Ibuprofen]  Other (See Comments)    Motrin ok with a GI effect.  . Clarithromycin Rash    Started after completing 10 day course of 2000 mg /day    Metabolic Disorder Labs: Lab Results  Component Value Date   HGBA1C 5.5 01/23/2018   No results found for: PROLACTIN Lab Results  Component Value Date   CHOL 218 (H) 11/23/2016   TRIG 123 11/23/2016  HDL 78 11/23/2016   CHOLHDL 2.8 11/23/2016   VLDL 25 11/23/2016   LDLCALC 115 (H) 11/23/2016   LDLCALC 121 05/04/2015   Lab Results  Component Value Date   TSH 1.810 10/04/2017   TSH 0.625 08/15/2017    Therapeutic Level Labs: No results found for: LITHIUM No results found for: VALPROATE No components found for:  CBMZ  Current Medications: Current Outpatient Medications  Medication Sig Dispense Refill  . acetaminophen (TYLENOL) 650 MG CR tablet Take 650 mg by mouth as needed for pain. Reported on 09/06/2015    . ALPRAZolam (XANAX) 0.5 MG tablet TAKE BY MOUTH UP TO 3 TIMES DAILY. WARNING: BENZODIAZEPINES INCREASE THE RISK OF FALLS AND NEUROCOGNITIVE DECLINE IN THE ELDERLY 90 tablet 2  . b complex vitamins capsule Take 1 capsule by mouth as needed.     . Carbidopa-Levodopa ER (SINEMET CR) 25-100 MG tablet controlled release TAKE 1 TABLET BY MOUTH THREE TIMES DAILY 270 tablet 1  . Cholecalciferol (VITAMIN D) 2000 UNITS tablet Take 2,000 Units by mouth daily.    . Probiotic Product (ALIGN PO) Take 1 capsule by mouth every other day.     Marland Kitchen SYNTHROID 25 MCG tablet Take 1 tablet (25 mcg total) by mouth daily before breakfast. 30 tablet 5  . vitamin E 400 UNIT capsule Take 400 Units by mouth every Monday, Wednesday, and Friday. Reported on 09/06/2015     No current facility-administered medications for this visit.     Musculoskeletal: Strength & Muscle Tone: within normal limits Gait & Station: normal Patient leans: N/A  Psychiatric Specialty Exam: ROS  There were no vitals taken for this visit.There is no height or weight on file to  calculate BMI.  General Appearance: Casual and Fairly Groomed  Eye Contact:  Fair  Speech:  Normal Rate  Volume:  Normal  Mood:  Euthymic and okay right now  Affect:  Appropriate and Congruent  Thought Process:  Coherent, Goal Directed and Descriptions of Associations: Intact  Orientation:  Full (Time, Place, and Person)  Thought Content: Logical   Suicidal Thoughts:  No  Homicidal Thoughts:  No  Memory:  Immediate;   Fair  Judgement:  Intact  Insight:  Present  Psychomotor Activity:  Normal  Concentration:  Concentration: Good  Recall:  Good  Fund of Knowledge: Good  Language: Good  Akathisia:  Negative  Handed:  Right  AIMS (if indicated): not done  Assets:  Communication Skills Desire for Improvement Financial Resources/Insurance Housing  ADL's:  Intact  Cognition: WNL  Sleep:  Fair   Screenings: ECT-MADRS     ECT Treatment from 05/07/2017 in [facility] [place]  MADRS Total Score  31    Mini-Mental     ECT Treatment from 05/07/2017 in [facility]  Total Score (max 30 points )  30    PHQ2-9     Office Visit from 08/27/2017 in [facility] Office Visit from 09/26/2016 in [facility] Office Visit from 05/04/2015 in [facility] Visit from 03/04/2015 in [facility] Office Visit from 01/27/2015 in [facility]  PHQ-2 Total Score  4  0  2  0  0      Assessment and Plan:  Tracy Malone presents with generally calm mood. Wishes to avoid any new medicines and stick with therapy, xanax prn and consider TMS if needed.  Will  follow-up in 6 weeks with Probation officer.  1. Major depressive disorder, recurrent, severe without psychotic features ([place])   2. Complicated grief   3. Parkinson's disease ([place])    Status of current problems: unchanged  Labs Ordered: No orders of the defined  types were placed in this encounter.  Labs Reviewed: n/a  Collateral Obtained/Records Reviewed: n/a  Plan:  Xanax 0.5 mg 3 times daily as needed RTC 6-8 weeks  Aundra Dubin, MD 02/25/2018, 10:30 AM

## 2018-02-26 ENCOUNTER — Ambulatory Visit: Payer: Medicare Other | Admitting: Podiatry

## 2018-03-04 ENCOUNTER — Encounter: Payer: Self-pay | Admitting: Podiatry

## 2018-03-05 ENCOUNTER — Ambulatory Visit (INDEPENDENT_AMBULATORY_CARE_PROVIDER_SITE_OTHER): Payer: Medicare Other | Admitting: Psychiatry

## 2018-03-05 DIAGNOSIS — F411 Generalized anxiety disorder: Secondary | ICD-10-CM

## 2018-03-06 ENCOUNTER — Ambulatory Visit (HOSPITAL_COMMUNITY)
Admission: RE | Admit: 2018-03-06 | Discharge: 2018-03-06 | Disposition: A | Discharge status: Home / Self Care | Payer: Medicare Other | Source: Ambulatory Visit | Attending: Vascular Surgery | Admitting: Vascular Surgery

## 2018-03-06 ENCOUNTER — Other Ambulatory Visit (HOSPITAL_COMMUNITY): Payer: Self-pay | Admitting: Family Medicine

## 2018-03-06 ENCOUNTER — Telehealth: Payer: Self-pay | Admitting: Licensed Clinical Social Worker

## 2018-03-06 DIAGNOSIS — M79605 Pain in left leg: Principal | ICD-10-CM

## 2018-03-06 DIAGNOSIS — M79604 Pain in right leg: Secondary | ICD-10-CM

## 2018-03-06 NOTE — Telephone Encounter (Signed)
Palliative Care SW left a vm on patient's home phone to set up a visit.

## 2018-03-07 ENCOUNTER — Telehealth: Payer: Self-pay

## 2018-03-07 NOTE — Telephone Encounter (Signed)
Will send to [facility]--can't have again until October (3 years from last)

## 2018-03-07 NOTE — Telephone Encounter (Signed)
Pt called stating she wants to have Cologuard done. She stated she is already aware this is the last time she can have it done. Please advise.

## 2018-03-10 ENCOUNTER — Encounter: Payer: Self-pay | Admitting: Family Medicine

## 2018-03-13 ENCOUNTER — Telehealth: Payer: Self-pay | Admitting: *Deleted

## 2018-03-13 NOTE — Telephone Encounter (Signed)
Contacted and spoke with patient to arrange home visit. Visit scheduled to visit with patient and husband on 8/30 at 12p.

## 2018-03-15 ENCOUNTER — Encounter: Payer: Self-pay | Admitting: Family Medicine

## 2018-03-15 ENCOUNTER — Ambulatory Visit (INDEPENDENT_AMBULATORY_CARE_PROVIDER_SITE_OTHER): Payer: Medicare Other | Admitting: Family Medicine

## 2018-03-15 VITALS — BP 130/68 | HR 64 | Temp 98.5°F | Ht 59.0 in | Wt 109.2 lb

## 2018-03-15 DIAGNOSIS — J029 Acute pharyngitis, unspecified: Secondary | ICD-10-CM | POA: Diagnosis not present

## 2018-03-15 LAB — POCT RAPID STREP A (OFFICE): Rapid Strep A Screen: NEGATIVE

## 2018-03-15 NOTE — Patient Instructions (Signed)
  There does not appear to be any evidence of bacterial infection. Strep test was negative. It could possibly be due to a virus, and/or exacerbated by some of things you have used to try and help the discomfort.  Stay away from the peppermint toothpaste. Drink plenty of water to stay well hydrated. Limit salty foods. Continue to use the Biotene. You can use tylenol as needed for the discomfort. You can continue to use heat vs ice (hot/cold liquids), whichever feels better.  Avoid citrus and acidic foods (ie pineapple, oranges, lemon/lime).  If you develop any runny nose, sniffling or other allergy symptoms, then start claritin for allergies, as the postnasal drainage can also contribute to sore throat--you don't appear to have that now, but in case it develops, this would be the recommendation.

## 2018-03-15 NOTE — Progress Notes (Signed)
Chief Complaint  Patient presents with  . Sore Throat    started 5 days ago with scratchy throat. Now feels swollen, lips were burning and mouth. No coughing or fever per patient.    Started with scratchy throat last weekend, then got worse, started to hurt to swallow, felt swollen. She had stuffy nose, but no runny nose, no cough.  Eyes feel "sort of glassy", not itchy or watery.   No known fever, headaches. No sick contacts.  Today her throat seems a little better. The upper part of her palate she has noticed some ridges intermittently, the lips and tongue looked red, almost looked like she had lipstick on. Inside of her mouth and tongue felt the same way.  She used chloraseptic spray last night, which made it hurt worse. Doing salt water gargles twice daily. She also used some Biotene yesterday, helped temporarily.  She used a new toothpaste, which had peppermint, which often bothers her--makes her mouth feel weird, may have started this.  She stopped using the toothpaste about a week ago.  PMH, PSH, SH reviewed  Outpatient Encounter Medications as of 03/15/2018  Medication Sig Note  . acetaminophen (TYLENOL) 500 MG tablet Take 500 mg by mouth every 6 (six) hours as needed.   . ALPRAZolam (XANAX) 0.5 MG tablet TAKE BY MOUTH UP TO 3 TIMES DAILY. WARNING: BENZODIAZEPINES INCREASE THE RISK OF FALLS AND NEUROCOGNITIVE DECLINE IN THE ELDERLY   . b complex vitamins capsule Take 1 capsule by mouth as needed.  10/26/2016: daily  . Carbidopa-Levodopa ER (SINEMET CR) 25-100 MG tablet controlled release TAKE 1 TABLET BY MOUTH THREE TIMES DAILY   . Cholecalciferol (VITAMIN D) 2000 UNITS tablet Take 2,000 Units by mouth daily. 05/04/2015: Taking daily  . Probiotic Product (ALIGN PO) Take 1 capsule by mouth every other day.    Marland Kitchen SYNTHROID 25 MCG tablet Take 1 tablet (25 mcg total) by mouth daily before breakfast.   . vitamin E 400 UNIT capsule Take 400 Units by mouth every Monday, Wednesday, and Friday.  Reported on 09/06/2015   . [DISCONTINUED] acetaminophen (TYLENOL) 650 MG CR tablet Take 650 mg by mouth as needed for pain. Reported on 09/06/2015    No facility-administered encounter medications on file as of 03/15/2018.    Allergies  Allergen Reactions  . Iodine Anaphylaxis    IV and topical forms.  Burnard Leigh [Hyoscyamine Sulfate]     Vision problems/pt has glaucoma  . [place] [Fish Allergy] Hives and Shortness Of Breath  . Shellfish Allergy Anaphylaxis  . Remeron [Mirtazapine] Other (See Comments)    Cause blurred vision and red eyes, pt has glaucoma  . Aspirin Other (See Comments)    Sever stomach pain due to ulcer scaring.  . Ciprofloxacin Diarrhea  . Codeine Nausea And Vomiting  . Darvocet [Propoxyphene N-Acetaminophen] Nausea And Vomiting  . Demerol [Meperidine] Nausea Only  . Dexilant [Dexlansoprazole] Swelling    Redness, swelling and peeling of both feet.  . Diphedryl [Diphenhydramine] Other (See Comments)    Increased pulse/small amount ok  . Doxycycline Hyclate Other (See Comments)    GI intolerance.  Marland Kitchen Epinephrine Other (See Comments)    Breathing problems  . Erythromycin Other (See Comments)    GI intolerance.  Yvette Rack [Cyclobenzaprine] Other (See Comments)    Tingly/prickly sensation.  Marland Kitchen Keflex [Cephalexin] Hives  . Latex Other (See Comments)    Gloves ok.  Skin gets red from elastic in underwear and latex bandaides.  . Prednisone Other (See  Comments)    Headache  . Pylera [Bis Subcit-Metronid-Tetracyc] Swelling    Tongue swelling. Face tingling  . Sulfa Antibiotics Other (See Comments)    Increased pulse, fainting, diarrhea, thrush  . Xylocaine [Lidocaine Hcl]     With epinephrine, given by dentist.  Speeded up heart rate and she passed out (occured twice, at dentist)  . Zoloft [Sertraline Hcl] Swelling and Other (See Comments)    Migraine Swelling of tongue/lip (09/2012)  . Advil [Ibuprofen] Other (See Comments)    Motrin ok with a GI effect.  .  Clarithromycin Rash    Started after completing 10 day course of 2000 mg /day   ROS: no fever, chills, URI or allergy symptoms. Sore throat per HPI. No chest pain, shortness of breath, urinary complaints, bleeding, bruising, rash. No GI complaints. See HPI   PHYSICAL EXAM:  BP 130/68   Pulse 64   Temp 98.5 F (36.9 C) (Tympanic)   Ht 4\' 11"  (1.499 m)   Wt 109 lb 3.2 oz (49.5 kg)   BMI 22.06 kg/m   Well appearing, thin, elderly female, in good spirits overall, full range of affect. HEENT: conjunctiva and sclera are clear, EOMI.  Two small areas of erythema at the center of the top of the hard palate, not raised, no ulceration. There is erythema at the edge of the anterior tonsillar pillars bilaterally, otherwise the rest of the OP appears entirely normal. Lips are somewhat dry. Tongue appears normal. Sinuses are nontender. Nasal mucosa is normal (mildly edematous and pale on the left, no drainage). TM's and EACs normal. Neck: no lymphadenopathy or mass Heart: regular rate and rhythm Lungs: clear bilaterally Skin: normal turgor, no rash Neuro: resting tremor of right hand.  Normal cranial nerves, gait Psych: normal mood, affect, hygiene and grooming  Rapid strep: Negative   ASSESSMENT/PLAN:  Pharyngitis, unspecified etiology  Sore throat - Plan: Rapid Strep A    There does not appear to be any evidence of bacterial infection. Strep test was negative. It could possibly be due to a virus, and/or exacerbated by some of things you have used to try and help the discomfort.  Stay away from the peppermint toothpaste. Drink plenty of water to stay well hydrated. Limit salty foods. Continue to use the Biotene. You can use tylenol as needed for the discomfort. You can continue to use heat vs ice (hot/cold liquids), whichever feels better.  Avoid citrus and acidic foods (ie pineapple, oranges, lemon/lime).  If you develop any runny nose, sniffling or other allergy symptoms,  then start claritin for allergies, as the postnasal drainage can also contribute to sore throat--you don't appear to have that now, but in case it develops, this would be the recommendation.

## 2018-03-16 ENCOUNTER — Encounter: Payer: Self-pay | Admitting: Family Medicine

## 2018-03-18 ENCOUNTER — Ambulatory Visit: Payer: Medicare Other | Admitting: Neurology

## 2018-03-18 DIAGNOSIS — I83813 Varicose veins of bilateral lower extremities with pain: Secondary | ICD-10-CM | POA: Diagnosis not present

## 2018-03-19 ENCOUNTER — Ambulatory Visit (INDEPENDENT_AMBULATORY_CARE_PROVIDER_SITE_OTHER): Payer: Medicare Other | Admitting: Psychiatry

## 2018-03-19 ENCOUNTER — Encounter (HOSPITAL_COMMUNITY): Payer: Self-pay

## 2018-03-19 ENCOUNTER — Encounter: Payer: Self-pay | Admitting: Family Medicine

## 2018-03-19 DIAGNOSIS — F411 Generalized anxiety disorder: Secondary | ICD-10-CM

## 2018-03-19 NOTE — Telephone Encounter (Signed)
Please send in Singac for pt (with lidocaine, benadryl and antacid like mylanta)), advise to use 5-10 cc swish, gargle and spit (not swallow) every 4-6 hours prn. Please advise pt that this should help with her discomfort, to use if/when needed over the next 3-5 days and hopefully she will be improving and symptoms will resolve.   Return for recheck if persistent/worsening/new symptoms.

## 2018-03-20 DIAGNOSIS — E039 Hypothyroidism, unspecified: Secondary | ICD-10-CM | POA: Diagnosis not present

## 2018-03-21 DIAGNOSIS — M79671 Pain in right foot: Secondary | ICD-10-CM | POA: Diagnosis not present

## 2018-03-22 ENCOUNTER — Other Ambulatory Visit: Payer: Medicare Other | Admitting: *Deleted

## 2018-03-22 ENCOUNTER — Other Ambulatory Visit: Payer: Medicare Other | Admitting: Licensed Clinical Social Worker

## 2018-03-22 DIAGNOSIS — Z515 Encounter for palliative care: Secondary | ICD-10-CM

## 2018-03-23 NOTE — Progress Notes (Signed)
COMMUNITY PALLIATIVE CARE SW NOTE  PATIENT NAME: Tracy Malone DOB: 01/15/1932 MRN: [id number]  PRIMARY CARE PROVIDER: [name], MD  RESPONSIBLE PARTY:  Acct ID - Guarantor Home Phone Work Phone Relationship Acct Type  [id number] [name] Self P/F     [place], [place] [address]     PLAN OF CARE and INTERVENTIONS:             1. GOALS OF CARE/ ADVANCE CARE PLANNING:  Patient wishes to remain in her home with her husband. 2. SOCIAL/EMOTIONAL/SPIRITUAL ASSESSMENT/ INTERVENTIONS:  SW and Palliative Care RN, [name], met with patient and her husband, [name], in their home.  SW provided active listening and supportive counseling while they participated in life review.  She expressed having difficulty adjusting to her Parkinson's diagnosis.  She has been married for over 32 years.  The couple has a daughter locally and a son that lives in [place].  Patient reported her daughter is very supportive and helpful.  The couple moved from [place] twelve years ago to be near family.  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  Patient enjoys knitting and said she does not have hand tremors when she knits.  She was very engaging and appeared to respond positively to the visit. 3. PATIENT/CAREGIVER EDUCATION/ COPING:  Patient copes by expressing her thoughts and feelings openly.  She currently is under the care of a psychiatrist and a psychologist.  SW provided education regarding Palliative Care and the SW role.  She said she understood. 4. PERSONAL EMERGENCY PLAN:  Patient will call her husband or EMS. 5. COMMUNITY RESOURCES COORDINATION/ HEALTH CARE NAVIGATION:  Patient makes her own appointments. 6. FINANCIAL/LEGAL CONCERNS/INTERVENTIONS:  None per patient.     SOCIAL HX:  Social History   Tobacco Use  . Smoking status: Never Smoker  . Smokeless tobacco: Never Used  Substance Use Topics  . Alcohol use: No    Alcohol/week: 0.0 standard  drinks    CODE STATUS:  Full Code  ADVANCED DIRECTIVES: N MOST FORM COMPLETE:  N HOSPICE EDUCATION PROVIDED: Yes PPS: Patient's appetite is normal.  She is able to stand and ambulate independently. Duration of visit and documentation:  60 minutes.      Tracy Corn Avianna Moynahan, LCSW

## 2018-03-26 NOTE — Progress Notes (Signed)
COMMUNITY PALLIATIVE CARE RN NOTE  PATIENT NAME: Tracy Malone DOB: 02/14/1932 MRN: [id number]  PRIMARY CARE PROVIDER: [name], MD  RESPONSIBLE PARTY:  Acct ID - Guarantor Home Phone Work Phone Relationship Acct Type  [id number] [name] Self P/F     [address], [place], [place] [address]    PLAN OF CARE and INTERVENTION:  1. ADVANCE CARE PLANNING/GOALS OF CARE: She wants to remain at home with her husband, avoid hospitalizations and start knitting again 2. PATIENT/CAREGIVER EDUCATION: Explained Palliative Care Services, Reinforced Safety/Fall Precautions 3. DISEASE STATUS: Joint visit made with Palliative Care SW, Lynn Duffy. Husband [name] also present during visit. Patient very pleasant and conversational. Denies pain at this time, but does report some soreness in her R foot from dropping a jar of peanut butter on it about a month ago. Her doctor has advised her to limit the time that she is on her foott and to wear flat shoes when going out. She voices her frustrations regarding her Parkinson's diagnosis and the various treatments that she has undergone. She does have a tremor noted at rest to her R arm/hand, but is still able to write and perform other ADLs independently such as bathing, dressing, toileting and ambulation. Her intake is fair, but she does say that she doesn't like cooking much anymore. Last week she was experiencing a sore throat with some nasal congestion. She was seen by her PCP. Her strep test was negative. Instructions given for patient to follow. She has Tylenol available if needed. She is welcoming of future visits from the Palliative Care Team.   HISTORY OF PRESENT ILLNESS:  This is a 84 yo female who resides at home with her husband. Palliative Care to follow patient monthly and PRN.  CODE STATUS: FULL CODE ADVANCED DIRECTIVES: N MOST FORM: no PPS: 50%   PHYSICAL EXAM:   LUNGS: clear to auscultation  CARDIAC: Cor  RRR EXTREMITIES: No edema SKIN: Skin color, texture, turgor normal. No rashes or lesions  NEURO: Alert and oriented x 3, pleasant mood, ambulatory    (Duration of visit and documentation 75 minutes)   [name], RN, BSN

## 2018-03-27 DIAGNOSIS — I8311 Varicose veins of right lower extremity with inflammation: Secondary | ICD-10-CM | POA: Diagnosis not present

## 2018-03-27 DIAGNOSIS — R600 Localized edema: Secondary | ICD-10-CM | POA: Diagnosis not present

## 2018-03-27 DIAGNOSIS — I8312 Varicose veins of left lower extremity with inflammation: Secondary | ICD-10-CM | POA: Diagnosis not present

## 2018-03-27 DIAGNOSIS — M79604 Pain in right leg: Secondary | ICD-10-CM | POA: Diagnosis not present

## 2018-03-29 ENCOUNTER — Encounter: Payer: Self-pay | Admitting: Family Medicine

## 2018-04-02 ENCOUNTER — Ambulatory Visit (INDEPENDENT_AMBULATORY_CARE_PROVIDER_SITE_OTHER): Payer: Medicare Other | Admitting: Psychiatry

## 2018-04-02 DIAGNOSIS — F411 Generalized anxiety disorder: Secondary | ICD-10-CM

## 2018-04-03 ENCOUNTER — Encounter: Payer: Self-pay | Admitting: Family Medicine

## 2018-04-03 ENCOUNTER — Other Ambulatory Visit: Payer: Self-pay | Admitting: Family Medicine

## 2018-04-03 DIAGNOSIS — Z1231 Encounter for screening mammogram for malignant neoplasm of breast: Secondary | ICD-10-CM

## 2018-04-05 ENCOUNTER — Ambulatory Visit: Payer: Medicare Other | Admitting: Podiatry

## 2018-04-10 ENCOUNTER — Telehealth: Payer: Self-pay | Admitting: Licensed Clinical Social Worker

## 2018-04-10 NOTE — Telephone Encounter (Signed)
Palliative Care SW left a message with patient to schedule a home visit next week.

## 2018-04-11 DIAGNOSIS — M79671 Pain in right foot: Secondary | ICD-10-CM | POA: Diagnosis not present

## 2018-04-12 ENCOUNTER — Encounter: Payer: Self-pay | Admitting: Family Medicine

## 2018-04-12 NOTE — Telephone Encounter (Signed)
Tracy Malone doesn't do MyChart, so Tracy Malone did it on his behalf.  She usually does it in his chart, but in error did it from hers.  Please copy and paste the information sent (and if possible, my reply) into a phone encounter into [employer] chart, so I can refer back to it when I see him next week.  Thanks

## 2018-04-16 ENCOUNTER — Ambulatory Visit (HOSPITAL_COMMUNITY): Payer: Self-pay | Admitting: Psychiatry

## 2018-04-16 ENCOUNTER — Ambulatory Visit (INDEPENDENT_AMBULATORY_CARE_PROVIDER_SITE_OTHER): Payer: Medicare Other | Admitting: Psychiatry

## 2018-04-16 DIAGNOSIS — F411 Generalized anxiety disorder: Secondary | ICD-10-CM

## 2018-04-17 ENCOUNTER — Other Ambulatory Visit (INDEPENDENT_AMBULATORY_CARE_PROVIDER_SITE_OTHER): Payer: Medicare Other

## 2018-04-17 DIAGNOSIS — Z23 Encounter for immunization: Secondary | ICD-10-CM

## 2018-04-19 ENCOUNTER — Other Ambulatory Visit: Payer: Medicare Other | Admitting: *Deleted

## 2018-04-19 ENCOUNTER — Other Ambulatory Visit: Payer: Medicare Other | Admitting: Licensed Clinical Social Worker

## 2018-04-19 DIAGNOSIS — Z515 Encounter for palliative care: Secondary | ICD-10-CM

## 2018-04-22 NOTE — Progress Notes (Signed)
COMMUNITY PALLIATIVE CARE RN NOTE  PATIENT NAME: Tracy Malone DOB: [birth year] MRN: [id number]  PRIMARY CARE PROVIDER: [name], MD  RESPONSIBLE PARTY:  Acct ID - Guarantor Home Phone Work Phone Relationship Acct Type  [id number] Tracy [phone] Self P/F     [address], [place], [place] [address]    PLAN OF CARE and INTERVENTION:  1. ADVANCE CARE PLANNING/GOALS OF CARE: She wants to remain at home with her husband and find out causes of her emotional issues 2. PATIENT/CAREGIVER EDUCATION: Reinforced Safe Mobility 3. DISEASE STATUS: Joint visit made with Palliative Care SW, Lynn Duffy. Met with patient and her husband in their home. Upon arrival, husband answered the door and states that she is having a "bad day" and is still lying in bed in her pajamas. She came into the living room, sat beside this RN and immediately placed her head on my shoulder and began crying. She is worried that something is wrong with her husband, as she watches him continually decline. She is upset that he has not gone to the MD sooner to report his issues. She also spoke about being upset about a conversation she had with her daughter. She feels that her daughter is more concerned with her husband than her. She has always had high anxiety. She saw her Psychiatrist this week who recommended that she be checked for a hormonal imbalance. She speaks about her Parkinson's diagnosis and her everyday struggles with living with this illness. She says she is unable to do anything that she used to be able to do. She remains able to bathe, dress, toilet and ambulate independently. Her intake is poor to fair. Her R foot is still sore where she dropped a can of peanut butter on it about 2 months ago. She wears a brace on her R foot for stability. She also wears a brace on her R wrist, which is the hand where her tremors are more profound. Will continue to monitor.  HISTORY OF PRESENT ILLNESS:  This is a 84  yo female who resides at home with her husband. Palliative Care Team continues to follow.  CODE STATUS: FULL CODE ADVANCED DIRECTIVES: N MOST FORM: no PPS: 50%   PHYSICAL EXAM:   LUNGS: clear to auscultation  CARDIAC: Cor RRR EXTREMITIES: No edema SKIN: Exposed skin is dry and intact  NEURO: Alert and oriented x 3, anxious and tearful, ambulatory   (Duration of visit and documentation 60 minutes)    [name], RN, BSN

## 2018-04-22 NOTE — Progress Notes (Signed)
COMMUNITY PALLIATIVE CARE SW NOTE  PATIENT NAME: Tracy Malone DOB: [birth year] MRN: [id number]  PRIMARY CARE PROVIDER: [name], MD  RESPONSIBLE PARTY:  Acct ID - Guarantor Home Phone Work Phone Relationship Acct Type  [id number] Tracy [phone] Self P/F     [address], [place], [place] [address]     PLAN OF CARE and INTERVENTIONS:             1. GOALS OF CARE/ ADVANCE CARE PLANNING:  Patient wants to remain at home with her husband.  She wishes to avoid hospitalizations.  She is a Full Code. 2. SOCIAL/EMOTIONAL/SPIRITUAL ASSESSMENT/ INTERVENTIONS:  SW and Palliative Care RN, Tracy Malone, met with patient and her husband, Tracy Malone.  Tracy Malone said patient was not having a good day and was in bed.  When patient came into the living room, she was in her pajamas and was crying.  She talked about how difficult it has been caring for others.  "I care too much."  She expressed concern for her husband who is having tests conducted today due to his difficulty swallowing.  RN remained with patient while SW spoke with Tracy Malone in the other room. 3. PATIENT/CAREGIVER EDUCATION/ COPING:  Patient expresses her feelings openly.  Continue providing Palliative Care program information. 4. PERSONAL EMERGENCY PLAN:  Patient will inform her family of medical concerns. 5. COMMUNITY RESOURCES COORDINATION/ HEALTH CARE NAVIGATION:  None. 6. FINANCIAL/LEGAL CONCERNS/INTERVENTIONS:  None     SOCIAL HX:  Social History   Tobacco Use  . Smoking status: Never Smoker  . Smokeless tobacco: Never Used  Substance Use Topics  . Alcohol use: No    Alcohol/week: 0.0 standard drinks    CODE STATUS:  Full Code  ADVANCED DIRECTIVES: N MOST FORM COMPLETE:   N HOSPICE EDUCATION PROVIDED:  N PPS:  Patient's appetite is normal.  She is able to stand and ambulate independently. Duration of visit and documentation:  45 minutes.      Creola Corn Adryen Cookson, LCSW

## 2018-04-30 ENCOUNTER — Other Ambulatory Visit: Payer: Self-pay | Admitting: *Deleted

## 2018-04-30 ENCOUNTER — Telehealth: Payer: Self-pay | Admitting: *Deleted

## 2018-04-30 ENCOUNTER — Ambulatory Visit (INDEPENDENT_AMBULATORY_CARE_PROVIDER_SITE_OTHER): Payer: Medicare Other | Admitting: Psychiatry

## 2018-04-30 ENCOUNTER — Encounter: Payer: Self-pay | Admitting: Family Medicine

## 2018-04-30 DIAGNOSIS — F411 Generalized anxiety disorder: Secondary | ICD-10-CM

## 2018-04-30 DIAGNOSIS — Z1211 Encounter for screening for malignant neoplasm of colon: Secondary | ICD-10-CM

## 2018-04-30 NOTE — Telephone Encounter (Signed)
Contacted and spoke with patient to arrange a home visit. Visit scheduled for 05/03/18 at 1 pm.

## 2018-04-30 NOTE — Telephone Encounter (Signed)
That's fine

## 2018-04-30 NOTE — Telephone Encounter (Signed)
I have a reminder that patient is due for repeat cologuard 04/2018-should I order?

## 2018-05-01 ENCOUNTER — Telehealth: Payer: Self-pay | Admitting: Neurology

## 2018-05-01 DIAGNOSIS — F332 Major depressive disorder, recurrent severe without psychotic features: Secondary | ICD-10-CM | POA: Diagnosis not present

## 2018-05-01 DIAGNOSIS — F4321 Adjustment disorder with depressed mood: Secondary | ICD-10-CM | POA: Diagnosis not present

## 2018-05-01 DIAGNOSIS — G20 Parkinson's disease: Secondary | ICD-10-CM | POA: Diagnosis not present

## 2018-05-01 NOTE — Telephone Encounter (Signed)
LMOM for Tracy Malone to call back.

## 2018-05-01 NOTE — Telephone Encounter (Signed)
Dorian Pod from Dr. Edger House office calling to speak with Dr.Tat's nurse to discuss a medication Dr.Tat just put patient on. Ellen's best contact is [phone] ext:[phone]

## 2018-05-01 NOTE — Telephone Encounter (Signed)
Spoke with Dorian Pod at Dr. Christen Butter office. 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 Patient states she has been this way since starting Carbidopa Levodopa. I did let Dorian Pod know she has been on it several months and hasn't mentioned mental health decline. She has pinpointed several other symptoms she thought were due to the medication to [place]. Dr. Helane Rima stopped her Carbidopa Levodopa. Patient was going to see psychiatry this afternoon to discuss. Dorian Pod could not tell me about what dose of Xanax she was currently taking because she did not have patient's chart in front of her (it was discussed last visit for patient to discuss this medicine with psychiatry).   Dr. Carles Collet Juluis Rainier.

## 2018-05-02 DIAGNOSIS — M79671 Pain in right foot: Secondary | ICD-10-CM | POA: Diagnosis not present

## 2018-05-02 NOTE — Telephone Encounter (Signed)
Ok.  Unfortunately, I have nothing further to offer her given medication "sensitivites" and intolerances.

## 2018-05-03 ENCOUNTER — Telehealth: Payer: Self-pay | Admitting: Neurology

## 2018-05-03 ENCOUNTER — Other Ambulatory Visit: Payer: Medicare Other | Admitting: Licensed Clinical Social Worker

## 2018-05-03 ENCOUNTER — Other Ambulatory Visit: Payer: Medicare Other | Admitting: *Deleted

## 2018-05-03 DIAGNOSIS — Z515 Encounter for palliative care: Secondary | ICD-10-CM

## 2018-05-03 NOTE — Telephone Encounter (Signed)
Tried to call Dr. Helane Rima to understand why carbidopa/levodopa 25/100 was stopped.  She was not in the office today.  Office was to transfer me to VM but didn't and I could not leave a message.  Luvenia Starch will you remind me to try and call next week.

## 2018-05-03 NOTE — Telephone Encounter (Signed)
Will set a reminder for next week.

## 2018-05-06 NOTE — Progress Notes (Signed)
COMMUNITY PALLIATIVE CARE SW NOTE  PATIENT NAME: Tracy Malone DOB: [birth year] MRN: [id number]  PRIMARY CARE PROVIDER: [name], MD  RESPONSIBLE PARTY:  Acct ID - Guarantor Home Phone Work Phone Relationship Acct Type  [id number] [name] Self P/F     [address], [place] [address]     PLAN OF CARE and INTERVENTIONS:             1. GOALS OF CARE/ ADVANCE CARE PLANNING:  Patient wishes to remain at home with her husband.  She is a full code. 2. SOCIAL/EMOTIONAL/SPIRITUAL ASSESSMENT/ INTERVENTIONS:  SW and Palliative Care RN, [name], met with patient and her husband, [name]. She was tearful during the visit due to "feeling bad" all of the time.  She stated she felt empty inside.  She presented her autobiography.  Explored complimentary therapies including aromatherapy and writing poetry.  She was able to tell her husband that she did not need a lecture, but needed a hug.  She also said she keeps having flashbacks of her ECT treatments.  Patient keeps her daughter informed of all of her medications. 3. PATIENT/CAREGIVER EDUCATION/ COPING:  Patient copes by expressing her feelings openly.   4. PERSONAL EMERGENCY PLAN:  Patient will inform her daughter and husband. 5. COMMUNITY RESOURCES COORDINATION/ HEALTH CARE NAVIGATION:  She has a Secretary/administrator. 6. FINANCIAL/LEGAL CONCERNS/INTERVENTIONS:  None per patient.     SOCIAL HX:  Social History   Tobacco Use  . Smoking status: Never Smoker  . Smokeless tobacco: Never Used  Substance Use Topics  . Alcohol use: No    Alcohol/week: 0.0 standard drinks    CODE STATUS:  Full Code ADVANCED DIRECTIVES: N MOST FORM COMPLETE:  N HOSPICE EDUCATION PROVIDED:  N PPS:  Patient reports her appetite is normal.  She is able to stand and ambulate independently. Duration of visit and documentation:  60 minutes.      Creola Corn Killian Schwer, LCSW

## 2018-05-07 NOTE — Telephone Encounter (Signed)
Tried to call Dr. Helane Rima again but she is not in the office currently.

## 2018-05-08 ENCOUNTER — Encounter: Payer: Self-pay | Admitting: *Deleted

## 2018-05-08 ENCOUNTER — Other Ambulatory Visit: Payer: Self-pay | Admitting: *Deleted

## 2018-05-08 DIAGNOSIS — E039 Hypothyroidism, unspecified: Secondary | ICD-10-CM

## 2018-05-08 MED ORDER — SYNTHROID 25 MCG PO TABS: 25.0000 ug | ORAL_TABLET | Freq: Every day | ORAL | 1 refills | Status: DC

## 2018-05-08 NOTE — Progress Notes (Signed)
COMMUNITY PALLIATIVE CARE RN NOTE  PATIENT NAME: Tracy Malone DOB: 02/19/1932 MRN: [id number]  PRIMARY CARE PROVIDER: [name], MD  RESPONSIBLE PARTY:  Acct ID - Guarantor Home Phone Work Phone Relationship Acct Type  [id number] [name] Self P/F     [address], [place], [place] [address]    PLAN OF CARE and INTERVENTION:  1. ADVANCE CARE PLANNING/GOALS OF CARE: Remain at home with her husband and avoid hospitalizations 2. PATIENT/CAREGIVER EDUCATION: Reinforced Safe Mobility and Alternative Therapies for Depression/Anxiety 3. DISEASE STATUS: Joint visit made with Palliative Care SW, Tracy Malone. Met with patient and her [name] in their home. Patient has a history of anxiety and depression, however her tearful episodes are occurring more frequently. She reports that she feels "bad" most of the time and that she only had one day where she felt good in the past several weeks. She was tearful on and off throughout the visit. She kept referring to when she had an ECT several years ago, and even being able to smell a "burnt" smell when she wakes up in the morning. SW discussed alternative therapies, including aromatherapy to help. She advised that her Sinemet CR has been placed on hold at this time to see if this will improve her mood/crying spells. This is Day 3 without this medication. She speaks about "hating" to take her Xanax, as she states it makes her feel bad and thinking is less clear, however when she tries to take a lesser dose, she ends up having to take the full dose eventually before the day is over to control her anxiety. She was able to cheer up towards end of visit and showed her autobiography and poem she wrote for her family. Will continue to monitor.  HISTORY OF PRESENT ILLNESS:  This is a 84 yo female who resides at home with her husband. Palliative Care Team continues to follow. Next visit scheduled in 2 weeks.   CODE STATUS: FULL  CODE ADVANCED DIRECTIVES: N MOST FORM: no PPS: 50%   PHYSICAL EXAM:   LUNGS: clear to auscultation  CARDIAC: Cor RRR EXTREMITIES: No edema SKIN: Thin, frail skin; Exposed skin is dry and intact  NEURO: Alert and oriented x 3, anxious, ambulates with walker    (Duration of visit and documentation 60 minutes)   [name], RN, BSN

## 2018-05-09 ENCOUNTER — Encounter: Payer: Self-pay | Admitting: Family Medicine

## 2018-05-09 NOTE — Telephone Encounter (Signed)
Called Dr. Helane Rima and was able to speak with her.  States pt was very depressed and reported it was from carbidopa/levodopa.  States that she didn't tell pt to stop carbidopa/levodopa 25/100 but to talk with Dr. Tomi Bamberger.  Reports pt had appt with Dr. Tomi Bamberger that same day (I was unable to find that note in system).  I did review notes and psychiatry notes indicate pt said that carbidopa/levodopa 25/100 "helped with mood and outlook."  Left message for Dr. Tomi Bamberger to call to discuss

## 2018-05-10 NOTE — Telephone Encounter (Signed)
Spoke with Dr. Tomi Bamberger this morning.  She confirmed that she didn't stop the patients carbidopa/levodopa 25/100 but that she was going to email pt and try to encourage her to restart it.  Discussed that it is okay if patient doesn't want to take the medication (although what pt thinks are SE likely are not and are manifestations of her signficant depression) but if she chooses not to take it, there are not a lot of PD treatment options for her.

## 2018-05-10 NOTE — Telephone Encounter (Signed)
I called Dr. Pascal Lux office and left message before realizing that she had left a voice mail for me.  I listened to that message.  Dr. Tomi Bamberger explained that she did not see the patient but was actually seeing the patients husband and pt asked her something about her foot.  Neither depression nor levodopa was addressed.  Dr. Tomi Bamberger does state (and I agree) that patient has very long standing MDD, long prior to start of levodopa.  She did not discontinue med.  I did note in palliative notes from yesterday that pt stopped the medication (levodopa).

## 2018-05-12 ENCOUNTER — Encounter: Payer: Self-pay | Admitting: Family Medicine

## 2018-05-13 DIAGNOSIS — M25572 Pain in left ankle and joints of left foot: Secondary | ICD-10-CM | POA: Diagnosis not present

## 2018-05-14 ENCOUNTER — Ambulatory Visit: Payer: Self-pay | Admitting: Psychiatry

## 2018-05-15 ENCOUNTER — Encounter: Payer: Self-pay | Admitting: Family Medicine

## 2018-05-16 DIAGNOSIS — F4321 Adjustment disorder with depressed mood: Secondary | ICD-10-CM | POA: Diagnosis not present

## 2018-05-16 DIAGNOSIS — F329 Major depressive disorder, single episode, unspecified: Secondary | ICD-10-CM | POA: Diagnosis not present

## 2018-05-17 ENCOUNTER — Other Ambulatory Visit: Payer: Medicare Other | Admitting: *Deleted

## 2018-05-17 ENCOUNTER — Other Ambulatory Visit: Payer: Medicare Other | Admitting: Licensed Clinical Social Worker

## 2018-05-17 DIAGNOSIS — Z515 Encounter for palliative care: Secondary | ICD-10-CM

## 2018-05-21 NOTE — Progress Notes (Signed)
COMMUNITY PALLIATIVE CARE SW NOTE  PATIENT NAME: Tracy Malone DOB: 1931-11-14 MRN: [id number]  PRIMARY CARE PROVIDER: [name], MD  RESPONSIBLE PARTY:  Acct ID - Guarantor Home Phone Work Phone Relationship Acct Type  [id number] Tracy [phone] Self P/F     [address], [place] [address]     PLAN OF CARE and INTERVENTIONS:             1. GOALS OF CARE/ ADVANCE CARE PLANNING:  Patient's goal is to remain at home with her husband.  She wishes to avoid hospitalizations.  She remains a full code. 2. SOCIAL/EMOTIONAL/SPIRITUAL ASSESSMENT/ INTERVENTIONS:  SW and Palliative Care RN, Tracy Malone, met with patient and her husband, Tracy Malone, in their home.  Patient stated she continues to feel "bad" with crying episodes.  She reflected on her ECT treatment last year, including the sensory and olfactory memories.  She has not attempted aromatherapy as previously suggested.  She was able to express to her husband her emotional needs.  SW continues providing education regarding the couple's different coping styles.  They acknowledge these differences, but do not alter their communication. 3. PATIENT/CAREGIVER EDUCATION/ COPING:  SW provided education regarding her emotions during her illness.  She said she understood.  Patient openly expresses her feelings.  She copes by seeking out medical professionals for her symptoms. 4. PERSONAL EMERGENCY PLAN:  Patient will stay in bed when she feels "bad".  She will inform her husband or daughter with medical concerns and they will act accordingly. 5. COMMUNITY RESOURCES COORDINATION/ HEALTH CARE NAVIGATION:  Patient has a housekeeper. 6. FINANCIAL/LEGAL CONCERNS/INTERVENTIONS:  None.     SOCIAL HX:  Social History   Tobacco Use  . Smoking status: Never Smoker  . Smokeless tobacco: Never Used  Substance Use Topics  . Alcohol use: No    Alcohol/week: 0.0 standard drinks    CODE STATUS:  Full Code ADVANCED DIRECTIVES:  N MOST FORM COMPLETE:  N HOSPICE EDUCATION PROVIDED: N PPS:  Patient's appetite is normal.  She is able to stand and ambulates independently. Duration of visit and documentation:  45 minutes.      Tracy Corn Senica Crall, LCSW

## 2018-05-21 NOTE — Progress Notes (Signed)
COMMUNITY PALLIATIVE CARE RN NOTE  PATIENT NAME: ERNIE SAGRERO DOB: 03-23-1932 MRN: [id number]  PRIMARY CARE PROVIDER: [name], MD  RESPONSIBLE PARTY:  Acct ID - Guarantor Home Phone Work Phone Relationship Acct Type  [id number] [name] Self P/F     [address], [place],  [address]    PLAN OF CARE and INTERVENTION:  1. ADVANCE CARE PLANNING/GOALS OF CARE: Remain at home with her husband and avoid the hospital 2. PATIENT/CAREGIVER EDUCATION: Reinforced Safe Mobility and Pain Management 3. DISEASE STATUS: Joint visit made with Palliative Care SW, Lynn Duffy. Met with patient and her husband in their home. She is in a much better mood this visit. No tearfulness. Continues to speak about receiving a ECT a year ago, and wishing that she hadn't. She reports not sleeping well last pm initially, but was then able to fall asleep after listening to some nature sounds on the headphones and slept for about 6.5 hours. She has started back taking her Levadopa, as she states that her R arm began to become painful and she was more shaky. She reports not seeing much difference in her mood, because she continues to have sad/tearful days. Crying is one of her coping mechanisms. She has an upcoming appointment with her Neurologist soon. She continues with anxiety and is taking Xanax to help. She also states that she worries often about her son and her husband's health. She is experiencing some pain in her L foot and had it wrapped in an ACE bandage. She states waking up one morning and her foot was swollen. She was seen by the Ortho MD, however they are unsure of the cause. It is recommended that she stay off of her feet as much as possible. She is ambulating with a slight limp d/t pain. She tries to keep it elevated. Will continue to monitor.   HISTORY OF PRESENT ILLNESS: This is a 84 yo female who resides at home with her husband. Palliative Care Team continues to follow.  Next visit scheduled in 3 weeks.    CODE STATUS: FULL CODE ADVANCED DIRECTIVES: N MOST FORM: no PPS: 50%   PHYSICAL EXAM:   LUNGS: clear to auscultation  CARDIAC: Cor RRR EXTREMITIES: Trace edema to L ankle/foot SKIN: Exposed skin is dry and intact  NEURO: Alert and oriented x 3, pleasant mood today, ambulatory   (Duration of visit and documentation 60 minutes)    [name], RN, BSN

## 2018-05-23 DIAGNOSIS — L821 Other seborrheic keratosis: Secondary | ICD-10-CM | POA: Diagnosis not present

## 2018-05-23 NOTE — Progress Notes (Signed)
Tracy Malone was seen today in the movement disorders clinic for neurologic consultation at the request of Tracy Ohara, MD.  The consultation is for the evaluation of R hand tremor.  Pt states that it started in November.  It seemed to come on abruptly but she doesn't remember.  It seems to come and go but overall hasn't gotten worse.  She notes it the most with eating but it is also is noted at rest.  She is right hand dominant.  She never notes it in the left hand or legs.  No family hx of tremor.  The records that were made available to me were reviewed.  This patient is accompanied in the office by her spouse who supplements the history.    Tremor: Yes.     Affected by caffeine:  No. (rarely drinks coke)  Affected by alcohol: doesn't drink alcohol  Affected by stress:  Yes.    Affected by fatigue:  Yes.    Spills soup if on spoon:  No.  Spills glass of liquid if full:  No.   Other sx's: Voice: no change per pt (she thinks that she has had to try to speak louder due to her husbands HOH) Sleep: trouble getting to sleep and trouble staying asleep  Vivid Dreams:  No.  Acting out dreams:  No. Wet Pillows: No. Postural symptoms:  No. (not unless takes restoril for sleep)  Falls?  No. Bradykinesia symptoms: trouble getting out of bed; walks slower than in the past and attributes to R knee pain after injury Loss of smell:  Unsure - states that she keeps smelling a chemical Loss of taste:  No. Urinary Incontinence:  No. Difficulty Swallowing:  Yes.   with pills Handwriting, micrographia: Yes.   Trouble with ADL's:  No.   Trouble buttoning clothing: No. Depression:  No., but admits to anxiety Memory changes:  No. Hallucinations:  No.  visual distortions: No. N/V:  No. Lightheaded:  No.  Syncope: No. Diplopia:  No. Dyskinesia:  No.  Neuroimaging has not previously been performed.   04/05/17 update:  I have not seen this patient in over 2 years.  The records that were made  available to me were reviewed.  She is accompanied by her husband and daughter, who supplement the history.  I have spoken to her psychiatrist.  I felt that she likely had early PD at our last visit.  I recommended carbidopa/levodopa 25/100.  She did not take this and transferred care to Dr. Felecia Malone.  He felt that she did not have PD but rather ET.  Xanax was recommended for her tremor.  When she last saw Dr. Felecia Malone, on 03/16/17, he started her on propranolol LA, 60 mg because of increased tremor. This did help some.   She has been seeing Dr. Daron Malone at [facility] and he felt that she had sx's consistent with PD and recommend she come to see me.  She states that she has had no falls.  She states that she has normal balance but states that she has back pain today and that has caused some trouble.  No dreams.  No acting out of the dreams.  Handwriting is very little.  Still having trouble with smelling things that are not there.  No visual distortions.  No hallucinations.  No trouble with ADL's/buttoning clothing.  Doing okay with swallowing.  Taking xanax 0.5 mg tid.  Still with depression.  Holding Lowgap for now.  Independent of depression, she  has crying spells.  09/21/17 update: Patient is seen back in follow-up today.  Many records reviewed since our last visit.  Pt accompanied by daughter and husband who supplements the history.  I started her on levodopa last visit.  Her daughter called only a few days after her visit to state that she took 1/2 tablet and developed burning and swelling in the lip/mouth.  She called her pharmacist and told her to stop taking the medication.  I gave her a new titration schedule that was very slow and they reported that even a half a tablet once per day caused red and puffy lips and swelling in the mouth.  We discussed that there really are not other options in her age group and that I did not think that this was likely a side effect of levodopa.  Her daughter reported that  depression was a bigger issue and they wanted to hold on further attempts with levodopa.  She has been to psychiatry.  Numerous records have been reviewed.  She started ECT treatments for depression.  That seemed to help some, but she developed some memory change after 3 treatments and decided to hold.  She remains faithful with follow-up with psychiatry.  Trazodone was started but she reports it caused blurred vision and she d/c it and started wellbutrin last week.  She saw ophthalmology and was told she just had dry eye.  Her Xanax was changed to lorazepam.  The patient has been in contact with Dr. Felecia Malone via email since our last visit.  She reported that she needed to see him quickly because she was told that she had Parkinson's disease.  One fall since our visit where she fell off poorly inflated exercise ball.  Daughter asks me about the crying spells independent of depression.  01/01/18 update: Patient is seen today for Parkinson's.  She is accompanied by her family who supplements the history.  The patient was given carbidopa/levodopa 25/100 CR last visit and told to slowly work up to 1 tablet 3 times per day.  Not surprisingly, she was not able to tolerate this either.  She had previously reported facial paresthesias with levodopa (as with many other medications) and I told her I did not think it was related to levodopa.  This time, she reports that taking it more than once per day causes headache.  Records have been reviewed extensively since last visit.  She just saw Dr. Felecia Malone on Nov 28, 2017.  He thought it was possible that the patient had Parkinson's disease and also told the patient to increase her medication to 3 times per day dosing (taking at 9am/3pm/9pm).  She states that she did that and has been on that for about 1 week.  "the only thing that I have noted is my walking is better.  I need to do something about the shaking of my hand."   She emailed Dr. Felecia Malone back asking him for another opinion at  [facility] or [facility].  She has been seeing psychiatry.  He noted her pre-occupation with the dx of PD.  She was started on prozac.  05/24/18 update: Patient is seen back today in follow-up for Parkinson's disease.  Husband accompanies patient who supplements the history.  Much  happened since her last visit.  I have talked to multiple physicians regarding the patient's care.  I have reviewed her records since our last visit.  I received a call from her gynecology office that the patient was complaining about depression  and mood changes and the patient told her gynecologist that it started when she started the levodopa.  Interestingly, her psychiatry records contradict this and states that she felt better since starting levodopa.  In any case, the levodopa was discontinued.  I talked to her primary care and she agreed that the patient has had long-standing depression.  Her primary care physician kindly contacted the patient and encouraged the patient to go back on the levodopa. Pt states that she went back on it 1.5 weeks ago.  She states that her psychiatrist told her to go on selegeline but she insists that she was told that she would need to go down on the levodopa to try that medication because selegeline "produces dopamine."    PREVIOUS MEDICATIONS: none to date  ALLERGIES:   Allergies  Allergen Reactions  . Iodine Anaphylaxis    IV and topical forms.  Burnard Leigh [Hyoscyamine Sulfate]     Vision problems/pt has glaucoma  . [place] [Fish Allergy] Hives and Shortness Of Breath  . Shellfish Allergy Anaphylaxis  . Remeron [Mirtazapine] Other (See Comments)    Cause blurred vision and red eyes, pt has glaucoma  . Aspirin Other (See Comments)    Sever stomach pain due to ulcer scaring.  . Ciprofloxacin Diarrhea  . Codeine Nausea And Vomiting  . Darvocet [Propoxyphene N-Acetaminophen] Nausea And Vomiting  . Demerol [Meperidine] Nausea Only  . Dexilant [Dexlansoprazole] Swelling    Redness, swelling  and peeling of both feet.  . Diphedryl [Diphenhydramine] Other (See Comments)    Increased pulse/small amount ok  . Doxycycline Hyclate Other (See Comments)    GI intolerance.  Marland Kitchen Epinephrine Other (See Comments)    Breathing problems  . Erythromycin Other (See Comments)    GI intolerance.  Yvette Rack [Cyclobenzaprine] Other (See Comments)    Tingly/prickly sensation.  Marland Kitchen Keflex [Cephalexin] Hives  . Latex Other (See Comments)    Gloves ok.  Skin gets red from elastic in underwear and latex bandaides.  . Prednisone Other (See Comments)    Headache  . Pylera [Bis Subcit-Metronid-Tetracyc] Swelling    Tongue swelling. Face tingling  . Sulfa Antibiotics Other (See Comments)    Increased pulse, fainting, diarrhea, thrush  . Xylocaine [Lidocaine Hcl]     With epinephrine, given by dentist.  Speeded up heart rate and she passed out (occured twice, at dentist)  . Zoloft [Sertraline Hcl] Swelling and Other (See Comments)    Migraine Swelling of tongue/lip (09/2012)  . Advil [Ibuprofen] Other (See Comments)    Motrin ok with a GI effect.  . Clarithromycin Rash    Started after completing 10 day course of 2000 mg /day    CURRENT MEDICATIONS:  Outpatient Encounter Medications as of 05/24/2018  Medication Sig  . acetaminophen (TYLENOL) 500 MG tablet Take 500 mg by mouth every 6 (six) hours as needed.  . ALPRAZolam (XANAX) 0.5 MG tablet TAKE BY MOUTH UP TO 3 TIMES DAILY. WARNING: BENZODIAZEPINES INCREASE THE RISK OF FALLS AND NEUROCOGNITIVE DECLINE IN THE ELDERLY  . b complex vitamins capsule Take 1 capsule by mouth as needed.   . Carbidopa-Levodopa ER (SINEMET CR) 25-100 MG tablet controlled release TAKE 1 TABLET BY MOUTH THREE TIMES DAILY  . Cholecalciferol (VITAMIN D) 2000 UNITS tablet Take 2,000 Units by mouth daily.  . Probiotic Product (ALIGN PO) Take 1 capsule by mouth every other day.   Marland Kitchen SYNTHROID 25 MCG tablet Take 1 tablet (25 mcg total) by mouth daily before breakfast.  .  vitamin  B-12 (CYANOCOBALAMIN) 1000 MCG tablet Take 1,000 mcg by mouth daily.  . vitamin E 400 UNIT capsule Take 400 Units by mouth every Monday, Wednesday, and Friday. Reported on 09/06/2015   No facility-administered encounter medications on file as of 05/24/2018.     PAST MEDICAL HISTORY:   Past Medical History:  Diagnosis Date  . Bell's palsy 1966   ? right side facial droop  . Carotid artery disease ([place]) 2010   on vascular screening;unchanged 2013.(could not tolerate simvastatin, no other statins tried)--<30% blockage bilat 07/2011  . Chronic abdominal pain   . Chronic fatigue and malaise   . Claustrophobia   . Depression    treated in the past for years;stopped in 2010 for a years  . Duodenal ulcer 1962   h/o  . Dysrhythmia    ocassional PVC's  . Fibromyalgia   . Frequent PVCs 07/2012   Seen by [facility]: benign, asymptomatic, normal EF  . GERD (gastroesophageal reflux disease)   . Glaucoma, narrow-angle    s/p laser surgery  . History of hiatal hernia    during endoscopy  . Hypothyroid 9/08  . IBS (irritable bowel syndrome)    Dr. Benson Norway  . Ocular migraine   . Osteoporosis 10/11   Dr.Hawkes  . Panic attack   . Recurrent UTI    has cystocele-Dr.Grewal  . Shingles 1999   h/o  . Superficial thrombophlebitis 03/2009   RLE  . Trochanteric bursitis 12/2008   bilateral    PAST SURGICAL HISTORY:   Past Surgical History:  Procedure Laterality Date  . ABDOMINAL HYSTERECTOMY    . CATARACT EXTRACTION, BILATERAL  1995, 1996  . Flexible sigmoidoscopy    . THYROIDECTOMY, PARTIAL  09/2005   L nodule; Dr. Harlow Asa  . TONSILLECTOMY  1946  . UPPER GI ENDOSCOPY  06/27/12  . VAGINAL HYSTERECTOMY  1971   and bladder repair.  Still has ovaries    SOCIAL HISTORY:   Social History   Socioeconomic History  . Marital status: Married    Spouse name: Not on file  . Number of children: 2  . Years of education: Not on file  . Highest education level: Not on file  Occupational History    . Occupation: retired (school system)  Social Needs  . Financial resource strain: Not on file  . Food insecurity:    Worry: Not on file    Inability: Not on file  . Transportation needs:    Medical: Not on file    Non-medical: Not on file  Tobacco Use  . Smoking status: Never Smoker  . Smokeless tobacco: Never Used  Substance and Sexual Activity  . Alcohol use: No    Alcohol/week: 0.0 standard drinks  . Drug use: No  . Sexual activity: Never  Lifestyle  . Physical activity:    Days per week: Not on file    Minutes per session: Not on file  . Stress: Not on file  Relationships  . Social connections:    Talks on phone: Not on file    Gets together: Not on file    Attends religious service: Not on file    Active member of club or organization: Not on file    Attends meetings of clubs or organizations: Not on file    Relationship status: Not on file  . Intimate partner violence:    Fear of current or ex partner: Not on file    Emotionally abused: Not on file    Physically  abused: Not on file    Forced sexual activity: Not on file  Other Topics Concern  . Not on file  Social History Narrative   Married.  Son lives in [place]; Daughter [name] lives in [place]; 2 grandchildren    FAMILY HISTORY:   Family Status  Relation Name Status  . Mother  Deceased at age 77       MI  . Father  Deceased at age 9       arteriosclerosis  . Sister 2 Alive       2, alive and well  . Brother  Deceased at age 64       "natural causes"--had PTSD  . Daughter  Alive  . Son  Alive  . Brother  Deceased       CAD  . Brother  Deceased       lung CA  . MGF  (Not Specified)    ROS:  Review of Systems  Constitutional: Positive for malaise/fatigue.  HENT: Negative.   Eyes: Negative.   Respiratory: Negative.   Cardiovascular: Negative.   Skin: Negative.      PHYSICAL EXAMINATION:    VITALS:   Vitals:   05/24/18 1246  BP: 126/70  Pulse: 80  SpO2: 98%  Weight: 113 lb (51.3 kg)   Height: 4\' 11"  (1.499 m)    GEN:  The patient appears stated age and is in NAD. HEENT:  Normocephalic, atraumatic.  The mucous membranes are moist. The superficial temporal arteries are without ropiness or tenderness. CV:  RRR Lungs:  CTAB Neck/HEME:  There are no carotid bruits bilaterally.  Neurological examination:  Orientation: The patient is alert and oriented x3. Cranial nerves: There is good facial symmetry. The speech is fluent and clear. Soft palate rises symmetrically and there is no tongue deviation. Hearing is intact to conversational tone. Sensation: Sensation is intact to light touch throughout Motor: Strength is 5/5 in the bilateral upper and lower extremities.   Shoulder shrug is equal and symmetric.  There is no pronator drift.   Movement examination: Tone: There is normal tone in the UE/LE Abnormal movements: There is a near constant RUE resting tremor that increases with distraction procedures.  Coordination:  There is no decremation, with any form of RAMS, including alternating supination and pronation of the forearm, hand opening and closing, finger taps, heel taps and toe taps. Gait and Station: She is able to arise out of the chair without the use of her hands.  She walks well in the hall  Labs:  Lab Results  Component Value Date   TSH 1.810 10/04/2017     ASSESSMENT/PLAN:  1.  Idiopathic Parkinson's disease.  The patient has tremor, bradykinesia, rigidity and mild postural instability.  -I had a long discussion with the patient today.  She was seen by me in 2016 and diagnosed with Parkinson's disease.  She transferred care to Dr. Felecia Malone who felt that she did not have Parkinson's disease, but rather essential tremor.  She then went back and forth for her care for a time.  -The patient has had many intolerances to medications, as evidenced by her allergy list.  She has tried levodopa several times.  She has reported multiple different side effects including  facial paresthesias, headache and now depression.  These are not generally side effects with levodopa and, in fact, I have not had any other patient with the side effects.  On levodopa.  She actually looks very well from a Parkinson's standpoint.  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  -she asks about what she can do to help the disease, but she doesn't want to exercise and is clear with me about that.  She does have ankle pain on the left, but we talked about exercises that she can do to avoid the ankle.  She then states that she is too tired to exercise.  We talked about the importance of exercising despite fatigue.  We talked about hiring a Clinical research associate.  She states that she cannot afford one.  She was given scholarship programs for essentially free personal trainers.  We talked about putting her barriers to her own health care.  She had multiple excuses to almost everything that was suggested today.    2.  PBA   -It certainly is possible that the patient has pseudobulbar affect in addition to depression.  3.  Depression/anxiety  -Patient is faithfully following with psychiatry.  I think this continues to be the biggest issue  -Patient's psychiatry has suggested scheduling for the treatment of depression.  This could be a good option.  Talked to the patient about the fact that she would not need to reduce levodopa just because she went on selegiline, as she states that she was told.  I think that she must have misunderstood.  I did tell her that selegiline may give her some energy as it breaks down into amphetamine-like properties.  4.   Insomnia  -refuses to take medication.  Refuses to exercise.  Melatonin makes her feel like she has a "boulder on her chest."   5.  Follow up is anticipated in the next few months, sooner should new neurologic issues arise.  Much greater than 50% of this visit was spent in counseling and coordinating care.  Total face  to face time:  25 min.  This did not include the 40 min of non face to face care/coordinating care which was detailed above.

## 2018-05-24 ENCOUNTER — Ambulatory Visit (INDEPENDENT_AMBULATORY_CARE_PROVIDER_SITE_OTHER): Payer: Medicare Other | Admitting: Neurology

## 2018-05-24 ENCOUNTER — Encounter: Payer: Self-pay | Admitting: Neurology

## 2018-05-24 VITALS — BP 126/70 | HR 80 | Ht 59.0 in | Wt 113.0 lb

## 2018-05-24 DIAGNOSIS — F331 Major depressive disorder, recurrent, moderate: Secondary | ICD-10-CM

## 2018-05-24 DIAGNOSIS — G20 Parkinson's disease: Secondary | ICD-10-CM | POA: Diagnosis not present

## 2018-05-24 DIAGNOSIS — G4700 Insomnia, unspecified: Secondary | ICD-10-CM | POA: Diagnosis not present

## 2018-05-24 DIAGNOSIS — G20A1 Parkinson's disease without dyskinesia, without mention of fluctuations: Secondary | ICD-10-CM

## 2018-05-24 NOTE — Patient Instructions (Signed)
Support & Resources  You are not alone. Being diagnosed with Parkinson's disease can be an emotional diagnosis, and one that impacts your whole family. Our goal is to make sure you're getting support not just while you're at [facility], but in the days between appointments. Our social worker will work with both patients and their support systems to help you cope with the changes your diagnosis brings to your everyday life. You will learn how to recognize and adjust to new needs, receive information about coping skills related to disease progression, receive counseling and be plugged into community resources and other areas of support. You can contact our Education officer, museum, Kathryne Hitch. Marcello Moores, for resources and support at [phone].   [place] [employer] [place] This group meets every third Tuesday from 4:00 p.m. - 5:00 p.m. at the [facility]. This group provides information about how Parkinson's disease affects you as an individual, how to proactively take control of Parkinson's disease, and steps to manage your Parkinson's disease, education, and information about exercise for lifelong activity.  Other Local Support Groups: (please call group leader to confirm meeting location) [facility] Day/Time Location Group Leader  [place]  1st Tuesday  (no June/July mtg) 10:30 am                                                                  [address] [place], [place] [address] Annette Caughron                            [phone]                                    [email or web address]         [place]  2nd Tuesday   10:30 am  [address]      [place], [place] [address] Ninetta Lights                                         [phone]                         [email or web address]   [place]) 1st Thursday     10:30 am [place], [place] [address] Berton Mount                                          [phone]   [place]- early onset  [place]                                      [email or web address]  [place]  3rd Monday     2pm [address] [place], [place] [address] [name], LCSW  Janett [email or web address] [email or web address] 2nd Thursday          10am [address], [place] [address] Reymundo Poll                      [phone]                       [email or web address]  3rd Wednesday      7pm  [address] [place],  [address] [phone]        Community Parkinson's Exercise Programs   Parkinson's Wellness Recovery Exercise Programs:   PWR! Moves PD Exercise Class:  This is a therapist-led exercise class for people with Parkinson's disease in the [place] community. It consists of a one-hour exercise class each week. Classes are offered in eight-week sessions, and the cost per session is $80. Class size is limited to a maximum of 20 participants. Participant criteria includes: Participant must be able to get up and down from the floor with minimal to no assistance, have had 0-1 falls in the past 6 months, and have completed physical or occupational therapy at [facility] within the past year.  To find out more about session dates, questions, or to register, please contact Mady Haagensen, Physical Therapist, or Nita Sells, Physical Brewing technologist, at [facility] at [phone].  PWR! Circuit Class:  This is a therapist-led exercise class with intervals of circuit activities incorporating PWR! Moves into functional activities. It consists of one 45-minute exercise class per week. Classes are offered in eight-week sessions, and the cost per session is $120. Class size is limited to a maximum of eight participants to allow for hands-on instruction. Participant criteria: class is ideal for people with Parkinson's disease who have completed  PWR! Moves Exercise Class or who are currently independently exercising and want to be challenged, must be able to walk independently with 0-1 falls in the past 6 months, able to get up and down from the floor independently, able to sit to stand independently, and able to jog 20 feet.   To find out more about session dates, questions, or to register, please contact Mady Haagensen, Physical Therapist, or Nita Sells, Physical Brewing technologist, at [facility] at [phone].   [employer] Parkinson's Cycle:   Parkinson's Cycle Class at [facility] This is an ongoing class on Monday and Thursday mornings at 10:45 a.m. A healthcare provider referral is required to enroll. This class is FREE to participants, and you do not have to be a member of the [employer] to enroll. Contact Beth at [phone] or [email or web address]. Parkinson's Cycle Class at [facility] Ongoing Class Monday, Wednesday, and Friday mornings at 9:00 a.m. A healthcare provider referral is required to enroll. This class is FREE to participants, and you do not have to be a member of the [employer] to enroll. Contact Marlee at [phone] or [email or web address]. Parkinson's Cycle Class at [facility] Ongoing Class every Friday mornings at 12 p.m.  A healthcare provider referral is required to enroll. This class is FREE to participants, and you do not have to be a member of the [employer] to enroll. Contact [phone].  Parkinson's Cycle Class at [facility] Ongoing Class every Monday at 12pm.  A healthcare provider referral is required to enroll. This class is FREE to participants, and you do not have to be a member of the [employer] to enroll.  Contact Almyra Free at [phone] or  [email or web address].   [employer]:  [employer]  Classes are offered Mondays at 5:15 p.m. and Tuesdays and Thursdays at 12 p.m. at [employer]. For more information, contact [phone] or visit [email or web address] or [email or web address]. [employer] [place] Classes are offered Monday, Wednesday, and Friday from 9:30 a.m. - 11:00 a.m. For more information, contact [phone] or [phone] or email [email or web address] or visit [email or web address] or [email or web address]. [employer] (classes are offered at 2 locations) . [facility] [employer] in [place] (for more information, contact [place] at [phone] or email [email or web address] . Cathren Laine at [facility] (class is open to the public -- for more information, contact Clabe Seal at [phone] or email [email or web address]) [place] are held at [facility] in [place], [place]. For more information, call Dr. Bing Plume at [phone] or [email or web address].   Personal Training for Parkinson's:   [employer] Offers certified personal training to customize a program to meet your exercise needs to address Parkinson's disease. For more information, contact [phone] or visit [email or web address].  Community Dance for Parkinson's:   Community dance class for people with Parkinson's Disease Wednesdays at 9 a.m. [employer] [place], [place] [address] Please contact Eliberto Ivory [phone] for more information  Scholarships Available for Fitness Programs:  The [place] for [employer] is a non-profit 501(C)3 organization run by volunteers, whose mission is to strive to empower those living with Parkinson's Disease (PD), Progressive Supra-Nuclear Palsy (PSP) and Multiple System Atrophy ([place]).  Through financial support, recipients benefit from individual and group programs. [phone] [email or web address]

## 2018-05-28 ENCOUNTER — Ambulatory Visit (INDEPENDENT_AMBULATORY_CARE_PROVIDER_SITE_OTHER): Payer: Medicare Other | Admitting: Psychiatry

## 2018-05-28 DIAGNOSIS — M25572 Pain in left ankle and joints of left foot: Secondary | ICD-10-CM | POA: Diagnosis not present

## 2018-05-28 DIAGNOSIS — F411 Generalized anxiety disorder: Secondary | ICD-10-CM

## 2018-05-30 DIAGNOSIS — G20 Parkinson's disease: Secondary | ICD-10-CM | POA: Diagnosis not present

## 2018-05-30 DIAGNOSIS — F5101 Primary insomnia: Secondary | ICD-10-CM | POA: Diagnosis not present

## 2018-05-30 DIAGNOSIS — F410 Panic disorder [episodic paroxysmal anxiety] without agoraphobia: Secondary | ICD-10-CM | POA: Diagnosis not present

## 2018-05-31 ENCOUNTER — Ambulatory Visit
Admission: RE | Admit: 2018-05-31 | Discharge: 2018-05-31 | Disposition: A | Discharge status: Home / Self Care | Payer: Medicare Other | Source: Ambulatory Visit | Attending: Family Medicine | Admitting: Family Medicine

## 2018-05-31 ENCOUNTER — Ambulatory Visit: Payer: Medicare Other | Admitting: Podiatry

## 2018-05-31 DIAGNOSIS — Z1231 Encounter for screening mammogram for malignant neoplasm of breast: Secondary | ICD-10-CM

## 2018-06-03 ENCOUNTER — Encounter: Payer: Self-pay | Admitting: Podiatry

## 2018-06-03 ENCOUNTER — Ambulatory Visit (INDEPENDENT_AMBULATORY_CARE_PROVIDER_SITE_OTHER): Payer: Medicare Other | Admitting: Podiatry

## 2018-06-03 ENCOUNTER — Encounter

## 2018-06-03 ENCOUNTER — Encounter: Payer: Self-pay | Admitting: Family Medicine

## 2018-06-03 DIAGNOSIS — B351 Tinea unguium: Secondary | ICD-10-CM

## 2018-06-03 DIAGNOSIS — M79674 Pain in right toe(s): Secondary | ICD-10-CM

## 2018-06-03 DIAGNOSIS — M79675 Pain in left toe(s): Secondary | ICD-10-CM | POA: Diagnosis not present

## 2018-06-03 DIAGNOSIS — L84 Corns and callosities: Secondary | ICD-10-CM

## 2018-06-03 NOTE — Patient Instructions (Signed)

## 2018-06-07 ENCOUNTER — Other Ambulatory Visit: Payer: Medicare Other | Admitting: Licensed Clinical Social Worker

## 2018-06-07 ENCOUNTER — Other Ambulatory Visit: Payer: Medicare Other | Admitting: *Deleted

## 2018-06-07 DIAGNOSIS — Z515 Encounter for palliative care: Secondary | ICD-10-CM

## 2018-06-10 DIAGNOSIS — Z79890 Hormone replacement therapy (postmenopausal): Secondary | ICD-10-CM | POA: Diagnosis not present

## 2018-06-10 DIAGNOSIS — Z9889 Other specified postprocedural states: Secondary | ICD-10-CM | POA: Diagnosis not present

## 2018-06-10 DIAGNOSIS — E063 Autoimmune thyroiditis: Secondary | ICD-10-CM | POA: Diagnosis not present

## 2018-06-10 DIAGNOSIS — E039 Hypothyroidism, unspecified: Secondary | ICD-10-CM | POA: Diagnosis not present

## 2018-06-11 ENCOUNTER — Ambulatory Visit (INDEPENDENT_AMBULATORY_CARE_PROVIDER_SITE_OTHER): Payer: Medicare Other | Admitting: Psychiatry

## 2018-06-11 DIAGNOSIS — F411 Generalized anxiety disorder: Secondary | ICD-10-CM | POA: Diagnosis not present

## 2018-06-11 NOTE — Progress Notes (Signed)
COMMUNITY PALLIATIVE CARE RN NOTE  PATIENT NAME: Tracy Malone DOB: 05-18-1932 MRN: [id number]  PRIMARY CARE PROVIDER: [name], MD  RESPONSIBLE PARTY:  Acct ID - Guarantor Home Phone Work Phone Relationship Acct Type  [id number] [name] Self P/F     [address], [place], [place] [address]    PLAN OF CARE and INTERVENTION:  1. ADVANCE CARE PLANNING/GOALS OF CARE: Remain at home with her husband and avoid going to the hospital 2. PATIENT/CAREGIVER EDUCATION: Reinforced Safe Mobility 3. DISEASE STATUS: Met with patient and her husband in their home. She is pleasant and smiling this visit. No tearful episodes today. She does report having a tearful episode last pm, but afterwards slept well throughout the night. She is unsure of what causes her crying spells, but states they come on all of a sudden. Today, upon arrival, she is cooking dinner in her Crockpot. She reports soreness in her L foot from a sudden occurrence of edema. Still unsure of cause. She wears a compression sock to help. She also reports experiencing generalized pain every morning when she wakes up, but seems to improve throughout the day. She does not take, and does not want to take pain medication to help. She continues on her Carbidopa-Levadopa TID and Xanax. She was recently seen by her Neurologist. No changes made to her plan of care. Continues with R hand tremor. Overall, she reports feeling good today. She went out earlier for breakfast with her husband and enjoyed the outing. Will continue to monitor.  HISTORY OF PRESENT ILLNESS:  This is a 84 yo female who resides at home with her husband. Palliative Care Team continues to follow patient. Will continue to visit monthly and PRN.  CODE STATUS: FULL CODE  ADVANCED DIRECTIVES: N MOST FORM: no PPS: 50%  PHYSICAL EXAM:   LUNGS: clear to auscultation  CARDIAC: Cor RRR EXTREMITIES: Trace edema in L foot SKIN: Exposed skin is dry and  intact  NEURO: Alert and oriented x 3, pleasant mood, ambulatory without assistive devices, R hand tremor   (Duration of visit and documentation 60 minutes)    [name], RN, BSN

## 2018-06-17 DIAGNOSIS — H0100A Unspecified blepharitis right eye, upper and lower eyelids: Secondary | ICD-10-CM | POA: Diagnosis not present

## 2018-06-17 DIAGNOSIS — H0100B Unspecified blepharitis left eye, upper and lower eyelids: Secondary | ICD-10-CM | POA: Diagnosis not present

## 2018-06-17 DIAGNOSIS — H43813 Vitreous degeneration, bilateral: Secondary | ICD-10-CM | POA: Diagnosis not present

## 2018-06-17 DIAGNOSIS — Z1211 Encounter for screening for malignant neoplasm of colon: Secondary | ICD-10-CM | POA: Diagnosis not present

## 2018-06-17 DIAGNOSIS — H52203 Unspecified astigmatism, bilateral: Secondary | ICD-10-CM | POA: Diagnosis not present

## 2018-06-17 LAB — COLOGUARD: Cologuard: NEGATIVE

## 2018-06-18 DIAGNOSIS — M79604 Pain in right leg: Secondary | ICD-10-CM | POA: Diagnosis not present

## 2018-06-18 DIAGNOSIS — M79605 Pain in left leg: Secondary | ICD-10-CM | POA: Diagnosis not present

## 2018-06-18 NOTE — Progress Notes (Signed)
Subjective: Tracy Malone presents today with painful, thick toenails 1-5 b/l that she cannot cut and which interfere with daily activities.  Pain is aggravated when wearing enclosed shoe gear.  Tracy Malone has h/o multiple drug allergies listed below:  Allergies  Allergen Reactions  . Iodine Anaphylaxis    IV and topical forms.  Tracy Malone [Hyoscyamine Sulfate]     Vision problems/pt has glaucoma  . Salmon [Fish Allergy] Hives and Shortness Of Breath  . Shellfish Allergy Anaphylaxis  . Remeron [Mirtazapine] Other (See Comments)    Cause blurred vision and red eyes, pt has glaucoma  . Aspirin Other (See Comments)    Sever stomach pain due to ulcer scaring.  . Ciprofloxacin Diarrhea  . Codeine Nausea And Vomiting  . Darvocet [Propoxyphene N-Acetaminophen] Nausea And Vomiting  . Demerol [Meperidine] Nausea Only  . Dexilant [Dexlansoprazole] Swelling    Redness, swelling and peeling of both feet.  . Diphedryl [Diphenhydramine] Other (See Comments)    Increased pulse/small amount ok  . Doxycycline Hyclate Other (See Comments)    GI intolerance.  Tracy Malone Kitchen Epinephrine Other (See Comments)    Breathing problems  . Erythromycin Other (See Comments)    GI intolerance.  Tracy Malone [Cyclobenzaprine] Other (See Comments)    Tingly/prickly sensation.  Tracy Malone Kitchen Keflex [Cephalexin] Hives  . Latex Other (See Comments)    Gloves ok.  Skin gets red from elastic in underwear and latex bandaides.  . Prednisone Other (See Comments)    Headache  . Pylera [Bis Subcit-Metronid-Tetracyc] Swelling    Tongue swelling. Face tingling  . Sulfa Antibiotics Other (See Comments)    Increased pulse, fainting, diarrhea, thrush  . Xylocaine [Lidocaine Hcl]     With epinephrine, given by dentist.  Speeded up heart rate and she passed out (occured twice, at dentist)  . Zoloft [Sertraline Hcl] Swelling and Other (See Comments)    Migraine Swelling of tongue/lip (09/2012)  . Advil [Ibuprofen] Other (See Comments)    Motrin  ok with a GI effect.  . Clarithromycin Rash    Started after completing 10 day course of 2000 mg /day   Objective: Vascular Examination: Capillary refill time immediate x 10 digits Dorsalis pedis and Posterior tibial pulses palpable b/l Digital hair x 10 digits present Skin temperature gradient WNL b/l  Dermatological Examination: Skin with normal turgor, texture and tone b/l  Toenails 1-5 b/l discolored, thick, dystrophic with subungual debris and pain with palpation to nailbeds due to thickness of nails.  Hyperkeratotic lesion submet head 5 b/l and submet head 1 left foot. No surrounding erythema, no edema, no flocculence.  Musculoskeletal: Muscle strength 5/5 to all LE muscle groups  Neurological: Sensation intact with 10 gram monofilament. Vibratory sensation intact.  Assessment: Painful onychomycosis toenails 1-5 b/l  Calluses b/l feet  Plan: 1. Toenails 1-5 b/l were debrided in length and girth without iatrogenic bleeding. 2. Calluses pared submet head 5 b/l and submet head 1 left foot. 3. Patient to continue soft, supportive shoe gear 4. Patient to report any pedal injuries to medical professional immediately. 5. Follow up 3 months. Patient/POA to call should there be a concern in the interim.

## 2018-06-19 DIAGNOSIS — M25572 Pain in left ankle and joints of left foot: Secondary | ICD-10-CM | POA: Diagnosis not present

## 2018-06-25 ENCOUNTER — Ambulatory Visit (INDEPENDENT_AMBULATORY_CARE_PROVIDER_SITE_OTHER): Payer: Medicare Other | Admitting: Psychiatry

## 2018-06-25 DIAGNOSIS — F411 Generalized anxiety disorder: Secondary | ICD-10-CM | POA: Diagnosis not present

## 2018-07-01 ENCOUNTER — Ambulatory Visit (INDEPENDENT_AMBULATORY_CARE_PROVIDER_SITE_OTHER): Payer: Medicare Other | Admitting: Family Medicine

## 2018-07-01 ENCOUNTER — Encounter: Payer: Self-pay | Admitting: *Deleted

## 2018-07-01 VITALS — BP 120/58 | HR 68 | Ht 59.0 in | Wt 114.4 lb

## 2018-07-01 DIAGNOSIS — M25473 Effusion, unspecified ankle: Secondary | ICD-10-CM | POA: Diagnosis not present

## 2018-07-01 DIAGNOSIS — I83893 Varicose veins of bilateral lower extremities with other complications: Secondary | ICD-10-CM

## 2018-07-01 NOTE — Progress Notes (Signed)
Chief Complaint  Patient presents with  . Edema    in both ankes. Saw ortho,podiatry and vascular for this without any help. Has some supportive pieces that she brought with her that were recommended but not helping.     Patient presents accompanied by her husband, with complaint of ankle swelling.  She had been having ongoing issues with pain at the left ankle (being treated by Dr. Delilah Shan), which has had some associated swelling.  Right foot has been swelling x 2.5 weeks. She has seen the vein doctors who didn't recommend treatment (just compression).  Hasn't been eating more salt. Hasn't been wearing compression socks. She finds they are too hard to get on.  She denies any pain with the swelling (on the right), break in skin, weeping, redness or pain.  She and her husband note that the tip of her right great toe (and some other toes) sometimes look very bright red, shiny.  She denies any associated pain or swelling in the toe, just the color change.  Denies it every looking white or blue. She reports some chronic numbness in that toe (x years)  Of note, she had normal ABI in 03/2018, US showed reflux in small and great saphenous veins  PMH, PSH, SH reviewed  Outpatient Encounter Medications as of 07/01/2018  Medication Sig Note  . ALPRAZolam (XANAX) 0.5 MG tablet TAKE BY MOUTH UP TO 3 TIMES DAILY. WARNING: BENZODIAZEPINES INCREASE THE RISK OF FALLS AND NEUROCOGNITIVE DECLINE IN THE ELDERLY   . b complex vitamins capsule Take 1 capsule by mouth as needed.  10/26/2016: daily  . Carbidopa-Levodopa ER (SINEMET CR) 25-100 MG tablet controlled release TAKE 1 TABLET BY MOUTH THREE TIMES DAILY   . Cholecalciferol (VITAMIN D) 2000 UNITS tablet Take 2,000 Units by mouth daily. 05/04/2015: Taking daily  . ibuprofen (ADVIL,MOTRIN) 200 MG tablet Take 200 mg by mouth every 6 (six) hours as needed.   Marland Kitchen SYNTHROID 25 MCG tablet Take 1 tablet (25 mcg total) by mouth daily before breakfast.   . vitamin B-12  (CYANOCOBALAMIN) 1000 MCG tablet Take 1,000 mcg by mouth daily.   . vitamin E 400 UNIT capsule Take 400 Units by mouth every Monday, Wednesday, and Friday. Reported on 09/06/2015   . acetaminophen (TYLENOL) 500 MG tablet Take 500 mg by mouth every 6 (six) hours as needed.   . Probiotic Product (ALIGN PO) Take 1 capsule by mouth every other day.     No facility-administered encounter medications on file as of 07/01/2018.    Allergies  Allergen Reactions  . Iodine Anaphylaxis    IV and topical forms.  Burnard Leigh [Hyoscyamine Sulfate]     Vision problems/pt has glaucoma  . Salmon [Fish Allergy] Hives and Shortness Of Breath  . Shellfish Allergy Anaphylaxis  . Remeron [Mirtazapine] Other (See Comments)    Cause blurred vision and red eyes, pt has glaucoma  . Aspirin Other (See Comments)    Sever stomach pain due to ulcer scaring.  . Ciprofloxacin Diarrhea  . Codeine Nausea And Vomiting  . Darvocet [Propoxyphene N-Acetaminophen] Nausea And Vomiting  . Demerol [Meperidine] Nausea Only  . Dexilant [Dexlansoprazole] Swelling    Redness, swelling and peeling of both feet.  . Diphedryl [Diphenhydramine] Other (See Comments)    Increased pulse/small amount ok  . Doxycycline Hyclate Other (See Comments)    GI intolerance.  Marland Kitchen Epinephrine Other (See Comments)    Breathing problems  . Erythromycin Other (See Comments)    GI intolerance.  Marland Kitchen  Flexeril [Cyclobenzaprine] Other (See Comments)    Tingly/prickly sensation.  Marland Kitchen Keflex [Cephalexin] Hives  . Latex Other (See Comments)    Gloves ok.  Skin gets red from elastic in underwear and latex bandaides.  . Prednisone Other (See Comments)    Headache  . Pylera [Bis Subcit-Metronid-Tetracyc] Swelling    Tongue swelling. Face tingling  . Sulfa Antibiotics Other (See Comments)    Increased pulse, fainting, diarrhea, thrush  . Xylocaine [Lidocaine Hcl]     With epinephrine, given by dentist.  Speeded up heart rate and she passed out (occured twice, at  dentist)  . Zoloft [Sertraline Hcl] Swelling and Other (See Comments)    Migraine Swelling of tongue/lip (09/2012)  . Advil [Ibuprofen] Other (See Comments)    Motrin ok with a GI effect.  . Clarithromycin Rash    Started after completing 10 day course of 2000 mg /day   ROS: no fever, chills, dizziness, chest pain, URI symptoms, shortness of breath.  +tremor, swelling, left ankle pain per HPI   PHYSICAL EXAM:  BP 140/70   Pulse 68   Ht 4\' 11"  (1.499 m)   Wt 114 lb 6.4 oz (51.9 kg)   BMI 23.11 kg/m   120/58 on repeat by MD  Pleasant, well-appearing female, in good spirits today.   HEENT: conjunctiva and sclera are clear, EOMI Neck: no lymphadenopathy or mass Heart: regular rate and rhythm Lungs: clear Extremities:  R leg/ankle--prominent distended veins across the top of the foot and ankle.  2+ pulses.  Right tip of great toe is pink/red, nontender, no swelling.  Some of the discoloration of the feet resolved immediately when she was supine. Veins are nontender No pitting edema noted today, just a small sock indentation at her lower leg. Left--there is a bony tenderness at the medial foot (above the arch), and some tenderness laterally at the ankle.  Some prominent veins medially, much less than on the right. Psych: normal mood, affect.  She is smiling, full range of affect, just some mild worry/anxiety per her usual. Normal hygiene, grooming, eye contact and speech Neuro: alert and oriented.  Tremor R>L UE noted. Normal gait   ASSESSMENT/PLAN:  Ankle swelling, unspecified laterality - bilat--L contributed by tendonitis, pain, R by varicose veins. Discussed sodium in diet and compression stockings. Reassured  Varicose veins of both legs with edema - R>L.  Discussed concerning signs (discoloration, weeping, cellulitis), compression, leg elevation, exercise. reassured.    Try wearing compression over the bulging veins (wrap, socks, or whatever is tolerated). Taking breaks  and elevated the feet and or walking to contract the muscles can also help mobilize the fluid. Some of your swelling is related to the dilated veins in the feet, and some may be related to salt, so try and limit the sodium in your diet (see handout).

## 2018-07-01 NOTE — Patient Instructions (Signed)
Try wearing compression over the bulging veins (wrap, socks, or whatever is tolerated). Taking breaks and elevated the feet and or walking to contract the muscles can also help mobilize the fluid. Some of your swelling is related to the dilated veins in the feet, and some may be related to salt, so try and limit the sodium in your diet (see handout).   Low-Sodium Eating Plan Sodium, which is an element that makes up salt, helps you maintain a healthy balance of fluids in your body. Too much sodium can increase your blood pressure and cause fluid and waste to be held in your body. Your health care provider or dietitian may recommend following this plan if you have high blood pressure (hypertension), kidney disease, liver disease, or heart failure. Eating less sodium can help lower your blood pressure, reduce swelling, and protect your heart, liver, and kidneys. What are tips for following this plan? General guidelines  Most people on this plan should limit their sodium intake to 1,500-2,000 mg (milligrams) of sodium each day. Reading food labels  The Nutrition Facts label lists the amount of sodium in one serving of the food. If you eat more than one serving, you must multiply the listed amount of sodium by the number of servings.  Choose foods with less than 140 mg of sodium per serving.  Avoid foods with 300 mg of sodium or more per serving. Shopping  Look for lower-sodium products, often labeled as "low-sodium" or "no salt added."  Always check the sodium content even if foods are labeled as "unsalted" or "no salt added".  Buy fresh foods. ? Avoid canned foods and premade or frozen meals. ? Avoid canned, cured, or processed meats  Buy breads that have less than 80 mg of sodium per slice. Cooking  Eat more home-cooked food and less restaurant, buffet, and fast food.  Avoid adding salt when cooking. Use salt-free seasonings or herbs instead of table salt or sea salt. Check with your  health care provider or pharmacist before using salt substitutes.  Cook with plant-based oils, such as canola, sunflower, or olive oil. Meal planning  When eating at a restaurant, ask that your food be prepared with less salt or no salt, if possible.  Avoid foods that contain MSG (monosodium glutamate). MSG is sometimes added to [place] food, bouillon, and some canned foods. What foods are recommended? The items listed may not be a complete list. Talk with your dietitian about what dietary choices are best for you. Grains Low-sodium cereals, including oats, puffed wheat and rice, and shredded wheat. Low-sodium crackers. Unsalted rice. Unsalted pasta. Low-sodium bread. Whole-grain breads and whole-grain pasta. Vegetables Fresh or frozen vegetables. "No salt added" canned vegetables. "No salt added" tomato sauce and paste. Low-sodium or reduced-sodium tomato and vegetable juice. Fruits Fresh, frozen, or canned fruit. Fruit juice. Meats and other protein foods Fresh or frozen (no salt added) meat, poultry, seafood, and fish. Low-sodium canned tuna and salmon. Unsalted nuts. Dried peas, beans, and lentils without added salt. Unsalted canned beans. Eggs. Unsalted nut butters. Dairy Milk. Soy milk. Cheese that is naturally low in sodium, such as ricotta cheese, fresh mozzarella, or Swiss cheese Low-sodium or reduced-sodium cheese. Cream cheese. Yogurt. Fats and oils Unsalted butter. Unsalted margarine with no trans fat. Vegetable oils such as canola or olive oils. Seasonings and other foods Fresh and dried herbs and spices. Salt-free seasonings. Low-sodium mustard and ketchup. Sodium-free salad dressing. Sodium-free light mayonnaise. Fresh or refrigerated horseradish. Lemon juice. Vinegar. Homemade, reduced-sodium,  or low-sodium soups. Unsalted popcorn and pretzels. Low-salt or salt-free chips. What foods are not recommended? The items listed may not be a complete list. Talk with your dietitian  about what dietary choices are best for you. Grains Instant hot cereals. Bread stuffing, pancake, and biscuit mixes. Croutons. Seasoned rice or pasta mixes. Noodle soup cups. Boxed or frozen macaroni and cheese. Regular salted crackers. Self-rising flour. Vegetables Sauerkraut, pickled vegetables, and relishes. Olives. [place] fries. Onion rings. Regular canned vegetables (not low-sodium or reduced-sodium). Regular canned tomato sauce and paste (not low-sodium or reduced-sodium). Regular tomato and vegetable juice (not low-sodium or reduced-sodium). Frozen vegetables in sauces. Meats and other protein foods Meat or fish that is salted, canned, smoked, spiced, or pickled. Bacon, ham, sausage, hotdogs, corned beef, chipped beef, packaged lunch meats, salt pork, jerky, pickled herring, anchovies, regular canned tuna, sardines, salted nuts. Dairy Processed cheese and cheese spreads. Cheese curds. Blue cheese. Feta cheese. String cheese. Regular cottage cheese. Buttermilk. Canned milk. Fats and oils Salted butter. Regular margarine. Ghee. Bacon fat. Seasonings and other foods Onion salt, garlic salt, seasoned salt, table salt, and sea salt. Canned and packaged gravies. Worcestershire sauce. Tartar sauce. Barbecue sauce. Teriyaki sauce. Soy sauce, including reduced-sodium. Steak sauce. Fish sauce. Oyster sauce. Cocktail sauce. Horseradish that you find on the shelf. Regular ketchup and mustard. Meat flavorings and tenderizers. Bouillon cubes. Hot sauce and Tabasco sauce. Premade or packaged marinades. Premade or packaged taco seasonings. Relishes. Regular salad dressings. Salsa. Potato and tortilla chips. Corn chips and puffs. Salted popcorn and pretzels. Canned or dried soups. Pizza. Frozen entrees and pot pies. Summary  Eating less sodium can help lower your blood pressure, reduce swelling, and protect your heart, liver, and kidneys.  Most people on this plan should limit their sodium intake to  1,500-2,000 mg (milligrams) of sodium each day.  Canned, boxed, and frozen foods are high in sodium. Restaurant foods, fast foods, and pizza are also very high in sodium. You also get sodium by adding salt to food.  Try to cook at home, eat more fresh fruits and vegetables, and eat less fast food, canned, processed, or prepared foods. This information is not intended to replace advice given to you by your health care provider. Make sure you discuss any questions you have with your health care provider. Document Released: 12/30/2001 Document Revised: 07/03/2016 Document Reviewed: 07/03/2016 Elsevier Interactive Patient Education  [employer].

## 2018-07-02 ENCOUNTER — Encounter: Payer: Self-pay | Admitting: Family Medicine

## 2018-07-02 LAB — COLOGUARD

## 2018-07-09 ENCOUNTER — Ambulatory Visit (INDEPENDENT_AMBULATORY_CARE_PROVIDER_SITE_OTHER): Payer: Medicare Other | Admitting: Psychiatry

## 2018-07-09 DIAGNOSIS — F411 Generalized anxiety disorder: Secondary | ICD-10-CM | POA: Diagnosis not present

## 2018-07-11 DIAGNOSIS — G20 Parkinson's disease: Secondary | ICD-10-CM | POA: Diagnosis not present

## 2018-07-11 DIAGNOSIS — F5101 Primary insomnia: Secondary | ICD-10-CM | POA: Diagnosis not present

## 2018-07-11 DIAGNOSIS — F410 Panic disorder [episodic paroxysmal anxiety] without agoraphobia: Secondary | ICD-10-CM | POA: Diagnosis not present

## 2018-07-12 ENCOUNTER — Other Ambulatory Visit: Payer: Medicare Other | Admitting: Licensed Clinical Social Worker

## 2018-07-12 ENCOUNTER — Other Ambulatory Visit: Payer: Medicare Other | Admitting: *Deleted

## 2018-07-12 DIAGNOSIS — Z515 Encounter for palliative care: Secondary | ICD-10-CM

## 2018-07-12 NOTE — Progress Notes (Signed)
COMMUNITY PALLIATIVE CARE RN NOTE  PATIENT NAME: Tracy Malone DOB: 01/01/1932 MRN: [id number]  PRIMARY CARE PROVIDER: Knapp, Eve, MD  RESPONSIBLE PARTY: Tracy Malone (husband) Acct ID - Guarantor Home Phone Work Phone Relationship Acct Type  [id number] - Tracy Malone,HEL* [phone] [id number] Self P/F     [address], [place], [place] [address]    PLAN OF CARE and INTERVENTION:  1. ADVANCE CARE PLANNING/GOALS OF CARE: She wants to remain at home with her husband. She is a Full Code 2. PATIENT/CAREGIVER EDUCATION: Reinforced Safe Mobility 3. DISEASE STATUS: Joint visit made with Palliative Care SW, Tracy Malone. Met with patient and her husband in their home. She is awake and alert. Pleasant and engaging most of the visit. Some tearfulness on and off throughout visit when patient speaks of ECT treatment she received in October 2018 that she feels she suffers with PTSD from. However, she does state that she has not been having as many crying spells recently that she has had in the past. She did have a crying spell that lasted all day on 07/08/18. She states that it comes on all of a sudden without warning, but does not occur daily. She speaks of her appointment with her Psychiatrist, Tracy Malone on yesterday, 07/11/18. He suggests that patient try TMS treatments for her depression. She wants more time to think about this. She has had this treatment in the past without negative side effects, but did not complete a full course. She reports mild pain in her lower back, she feels was caused after vacuuming her house. Also reports pain in both feet. She does not like taking medication. She is using moist heat for her back pain, which is effective. She continues on Carbidopa-Levidopa for her Parkinson's and Xanax for anxiety. R hand tremors noted. She states that she would like to wean off of Xanax. She spoke about her love for knitting, and has started back knitting scarfs. She reports her intake is good  and she drinks plenty of fluids. Provided active listening and support. She remains ambulatory and able to perform ADLs independently. Will continue to monitor.    HISTORY OF PRESENT ILLNESS:  This is a 84 yo female who resides at home with her husband. Palliative Care Team continues to follow patient. Will continue to visit monthly and PRN.  CODE STATUS: FULL CODE ADVANCED DIRECTIVES: N MOST FORM: no PPS: 50%   PHYSICAL EXAM:   LUNGS: clear to auscultation  CARDIAC: Cor RRR EXTREMITIES: No edema SKIN: Exposed skin is dry and intact  NEURO: Alert and oriented x 3, pleasant mood, ambulatory   (Duration of visit and documentation 45 minutes)    Tracy Howard, RN, BSN 

## 2018-07-15 NOTE — Progress Notes (Signed)
COMMUNITY PALLIATIVE CARE SW NOTE  PATIENT NAME: Tracy Malone DOB: 07/27/1931 MRN: [id number]  PRIMARY CARE PROVIDER: Knapp, Eve, MD  RESPONSIBLE PARTY:  Acct ID - Guarantor Home Phone Work Phone Relationship Acct Type  [id number] - Ozawa,HEL* [phone] [id number] Self P/F     [address], [place], [place] [address]    PLAN OF CARE and INTERVENTIONS:             1. GOALS OF CARE/ ADVANCE CARE PLANNING:  Patient wishes to live at home with her husband as long as possible.  She does not want to be hospitalized.  She remains a full code. 2. SOCIAL/EMOTIONAL/SPIRITUAL ASSESSMENT/ INTERVENTIONS:  SW and Palliative Care RN, Monishia Howard, met with patient and her husband, Bill, in their home.  Patient reports having decreased crying spells in the past few weeks.  She stated last Monday that she cried continuously with no specific prompt.  She does have flashbacks of her ECT treatments which are quite distressful.  Patient continues stating that her family does not understand her emotional state. 3. PATIENT/CAREGIVER EDUCATION/ COPING:  Patient openly expresses her feelings.  She seeks out medical professionals for her symptoms. 4. PERSONAL EMERGENCY PLAN:  She will inform her husband or daughter with medical concerns. 5. COMMUNITY RESOURCES COORDINATION/ HEALTH CARE NAVIGATION:  Patient has a housekeeper. 6. FINANCIAL/LEGAL CONCERNS/INTERVENTIONS:  None.        SOCIAL HX:  Social History   Tobacco Use  . Smoking status: Never Smoker  . Smokeless tobacco: Never Used  Substance Use Topics  . Alcohol use: No    Alcohol/week: 0.0 standard drinks    CODE STATUS:  Full Code  ADVANCED DIRECTIVES: N MOST FORM COMPLETE:  N HOSPICE EDUCATION PROVIDED:  N PPS: Patient reports her appetite is normal.  She is able to stand and ambulate independently.   Duration of visit and documentation:  45 minutes.       Z , LCSW 

## 2018-07-17 ENCOUNTER — Encounter: Payer: Self-pay | Admitting: Family Medicine

## 2018-07-22 DIAGNOSIS — M810 Age-related osteoporosis without current pathological fracture: Secondary | ICD-10-CM | POA: Diagnosis not present

## 2018-07-22 DIAGNOSIS — M25569 Pain in unspecified knee: Secondary | ICD-10-CM | POA: Diagnosis not present

## 2018-07-22 DIAGNOSIS — M797 Fibromyalgia: Secondary | ICD-10-CM | POA: Diagnosis not present

## 2018-07-22 DIAGNOSIS — M25559 Pain in unspecified hip: Secondary | ICD-10-CM | POA: Diagnosis not present

## 2018-07-22 DIAGNOSIS — F329 Major depressive disorder, single episode, unspecified: Secondary | ICD-10-CM | POA: Diagnosis not present

## 2018-07-22 DIAGNOSIS — R251 Tremor, unspecified: Secondary | ICD-10-CM | POA: Diagnosis not present

## 2018-07-22 DIAGNOSIS — G20 Parkinson's disease: Secondary | ICD-10-CM | POA: Diagnosis not present

## 2018-07-22 DIAGNOSIS — Z8739 Personal history of other diseases of the musculoskeletal system and connective tissue: Secondary | ICD-10-CM | POA: Diagnosis not present

## 2018-07-23 ENCOUNTER — Ambulatory Visit (INDEPENDENT_AMBULATORY_CARE_PROVIDER_SITE_OTHER): Payer: Medicare Other | Admitting: Psychiatry

## 2018-07-23 DIAGNOSIS — F411 Generalized anxiety disorder: Secondary | ICD-10-CM | POA: Diagnosis not present

## 2018-07-26 DIAGNOSIS — M25559 Pain in unspecified hip: Secondary | ICD-10-CM | POA: Diagnosis not present

## 2018-07-26 DIAGNOSIS — R251 Tremor, unspecified: Secondary | ICD-10-CM | POA: Diagnosis not present

## 2018-07-26 DIAGNOSIS — Z8739 Personal history of other diseases of the musculoskeletal system and connective tissue: Secondary | ICD-10-CM | POA: Diagnosis not present

## 2018-07-26 DIAGNOSIS — F329 Major depressive disorder, single episode, unspecified: Secondary | ICD-10-CM | POA: Diagnosis not present

## 2018-07-26 DIAGNOSIS — G20 Parkinson's disease: Secondary | ICD-10-CM | POA: Diagnosis not present

## 2018-07-26 DIAGNOSIS — M25569 Pain in unspecified knee: Secondary | ICD-10-CM | POA: Diagnosis not present

## 2018-07-26 DIAGNOSIS — M797 Fibromyalgia: Secondary | ICD-10-CM | POA: Diagnosis not present

## 2018-07-30 DIAGNOSIS — R350 Frequency of micturition: Secondary | ICD-10-CM

## 2018-07-31 ENCOUNTER — Telehealth: Payer: Self-pay | Admitting: Neurology

## 2018-07-31 NOTE — Telephone Encounter (Signed)
Referral faxed to Dr. Zigmund Daniel at [phone] with confirmation received. They should call patient to schedule.

## 2018-08-06 ENCOUNTER — Ambulatory Visit (INDEPENDENT_AMBULATORY_CARE_PROVIDER_SITE_OTHER): Payer: Medicare Other | Admitting: Psychiatry

## 2018-08-06 ENCOUNTER — Telehealth: Payer: Self-pay | Admitting: Neurology

## 2018-08-06 DIAGNOSIS — F411 Generalized anxiety disorder: Secondary | ICD-10-CM

## 2018-08-06 NOTE — Telephone Encounter (Signed)
Received note from [facility] to call to make appt with Dr. Zigmund Daniel with Urology. Called and made appt for next available on October 09, 2018 at 9:45 am. They will mail details and appt information to patient directly.

## 2018-08-08 ENCOUNTER — Encounter: Payer: Self-pay | Admitting: *Deleted

## 2018-08-08 DIAGNOSIS — Z8739 Personal history of other diseases of the musculoskeletal system and connective tissue: Secondary | ICD-10-CM | POA: Diagnosis not present

## 2018-08-08 DIAGNOSIS — G20 Parkinson's disease: Secondary | ICD-10-CM | POA: Diagnosis not present

## 2018-08-08 DIAGNOSIS — M25569 Pain in unspecified knee: Secondary | ICD-10-CM | POA: Diagnosis not present

## 2018-08-08 DIAGNOSIS — M797 Fibromyalgia: Secondary | ICD-10-CM | POA: Diagnosis not present

## 2018-08-08 DIAGNOSIS — M25559 Pain in unspecified hip: Secondary | ICD-10-CM | POA: Diagnosis not present

## 2018-08-08 DIAGNOSIS — F5101 Primary insomnia: Secondary | ICD-10-CM | POA: Diagnosis not present

## 2018-08-08 DIAGNOSIS — F410 Panic disorder [episodic paroxysmal anxiety] without agoraphobia: Secondary | ICD-10-CM | POA: Diagnosis not present

## 2018-08-08 DIAGNOSIS — F329 Major depressive disorder, single episode, unspecified: Secondary | ICD-10-CM | POA: Diagnosis not present

## 2018-08-08 DIAGNOSIS — M7071 Other bursitis of hip, right hip: Secondary | ICD-10-CM | POA: Diagnosis not present

## 2018-08-08 DIAGNOSIS — R251 Tremor, unspecified: Secondary | ICD-10-CM | POA: Diagnosis not present

## 2018-08-12 ENCOUNTER — Telehealth: Payer: Self-pay | Admitting: Licensed Clinical Social Worker

## 2018-08-12 NOTE — Telephone Encounter (Signed)
SW phoned patient's home to schedule an appointment.  Home visit is scheduled tomorrow,  1/21, at 1pm.

## 2018-08-13 ENCOUNTER — Other Ambulatory Visit: Payer: Medicare Other | Admitting: *Deleted

## 2018-08-13 ENCOUNTER — Other Ambulatory Visit: Payer: Medicare Other | Admitting: Licensed Clinical Social Worker

## 2018-08-13 DIAGNOSIS — Z515 Encounter for palliative care: Secondary | ICD-10-CM

## 2018-08-14 NOTE — Progress Notes (Signed)
COMMUNITY PALLIATIVE CARE SW NOTE  PATIENT NAME: Tracy Malone DOB: 05/28/1932 MRN: [id number]  PRIMARY CARE PROVIDER: Knapp, Eve, MD  RESPONSIBLE PARTY:  Acct ID - Guarantor Home Phone Work Phone Relationship Acct Type  [id number] - Helm,HEL* [phone] [id number] Self P/F     [address], [place], [place] [address]     PLAN OF CARE and INTERVENTIONS:             1. GOALS OF CARE/ ADVANCE CARE PLANNING:  Goal for patient is to remain at home with her husband, Tracy Malone.  She does not want any further hospitalizations.  She is a full code. 2. SOCIAL/EMOTIONAL/SPIRITUAL ASSESSMENT/ INTERVENTIONS:  SW and Palliative Care RN, Monishia Howard, met with patient and her husband, Tracy Malone, in their home.  Patient was initially tearful when discussing her son in [place].  She continues focusing on her symptoms of depression.  This causes conflict with her husband who expresses having control over his emotions, where Lizbet says she does not.  Patient denied pain during this visit.  She is to begin a new Parkinsons medication.  SW provided active listening and supportive counseling. 3. PATIENT/CAREGIVER EDUCATION/ COPING:  Patient copes by expressing her feelings openly.  She crochets to steady her hands from the Parkinsons.  She recently began using essential oils. 4. PERSONAL EMERGENCY PLAN:  She will inform her husband of her medical needs and he will act accordingly. 5. COMMUNITY RESOURCES COORDINATION/ HEALTH CARE NAVIGATION:  Patient has a housekeeper. 6. FINANCIAL/LEGAL CONCERNS/INTERVENTIONS:  None.     SOCIAL HX:  Social History   Tobacco Use  . Smoking status: Never Smoker  . Smokeless tobacco: Never Used  Substance Use Topics  . Alcohol use: No    Alcohol/week: 0.0 standard drinks    CODE STATUS:  Full Code ADVANCED DIRECTIVES: N MOST FORM COMPLETE:  N HOSPICE EDUCATION PROVIDED:  N PPS:  Patient reports a normal appetite.  She is able to stand and ambulate independently. Duration  of visit and documentation:  45 minutes. Plan:  Continue assessing patient coping.       Z , LCSW 

## 2018-08-16 NOTE — Progress Notes (Signed)
COMMUNITY PALLIATIVE CARE RN NOTE  PATIENT NAME: Tracy Malone DOB: 1932-05-01 MRN: [id number]  PRIMARY CARE PROVIDER: [name], MD  RESPONSIBLE PARTY: [name] (husband) Acct ID - Guarantor Home Phone Work Phone Relationship Acct Type  [id number] [name] Self P/F     [address], [place], [place] [address]    PLAN OF CARE and INTERVENTION:  1. ADVANCE CARE PLANNING/GOALS OF CARE: She wants to remain at home with her husband 2. PATIENT/CAREGIVER EDUCATION: Relaxation Techniques and Aromatherapy 3. DISEASE STATUS: Joint visit made with Palliative Care SW, Lynn Duffy. Patient very tearful throughout visit as she received disturbing news regarding her son and is having difficulty coping. Provided active listening and support. She had a recent appointment with Dr. [name] who would like patient to start on a new medication first, Safinamide, before trying [place] treatments for her depression. She just received the medication yesterday. Pharmacy had to order it. She did not want to start it today because she had a Dentist appointment this am and did not know how it would affect her. She plans to start tomorrow. She remains able to perform ADLs independently. She does have a R hand tremor but does not allow this to interfere with her daily life. Will continue to monitor.  HISTORY OF PRESENT ILLNESS:  This is a 85 yo female who resides at home with her husband. Palliative Care Team continues to follow patient. Team to visit monthly and PRN.   CODE STATUS: FULL CODE  ADVANCED DIRECTIVES: N MOST FORM: no PPS: 50%   PHYSICAL EXAM:   LUNGS: clear to auscultation  CARDIAC: Cor RRR EXTREMITIES: No edema SKIN: Exposed skin is dry and intact  NEURO: Alert and oriented x 3, tearful/sad, ambulatory   (Duration of visit and documentation 60 minutes)    [name], RN, BSN

## 2018-08-20 ENCOUNTER — Ambulatory Visit (INDEPENDENT_AMBULATORY_CARE_PROVIDER_SITE_OTHER): Payer: Medicare Other | Admitting: Psychiatry

## 2018-08-20 DIAGNOSIS — F411 Generalized anxiety disorder: Secondary | ICD-10-CM | POA: Diagnosis not present

## 2018-08-21 ENCOUNTER — Encounter: Payer: Self-pay | Admitting: Family Medicine

## 2018-08-21 ENCOUNTER — Ambulatory Visit (INDEPENDENT_AMBULATORY_CARE_PROVIDER_SITE_OTHER): Payer: Medicare Other | Admitting: Family Medicine

## 2018-08-21 VITALS — BP 140/70 | HR 72 | Temp 98.7°F | Ht 59.0 in | Wt 109.8 lb

## 2018-08-21 DIAGNOSIS — J029 Acute pharyngitis, unspecified: Secondary | ICD-10-CM | POA: Diagnosis not present

## 2018-08-21 LAB — POCT RAPID STREP A (OFFICE): Rapid Strep A Screen: NEGATIVE

## 2018-08-21 NOTE — Patient Instructions (Signed)
  You do not have strep throat.  I think your pain is from postnasal drainage. Try and sleep with the head elevated.  Consider using claritin to help dry up the mucus. Continue salt water gargles. You can use tylenol as needed for the pain.  Contact us if you develop fever, discolored mucus, sinus pain, or other new symptoms.

## 2018-08-21 NOTE — Progress Notes (Signed)
Chief Complaint  Patient presents with  . Sore Throat    mostly on right side, about 10 days. No fever. Bringing up lots of clear mucus, doing salt water gargles.    Noted right sided sore throat about 10 days ago.  Hasn't gotten worse, but not better either.  It is worse in the morning (pain, and also has some worse mouth dryness in the morning). No runny nose, sinus pain, cough.  She has PND Slight ear pain on the right, off/on  No known fever.  Occasional slight chill.   Selegiline from (psych) was started last week, just once daily--to help with anxiety/depression, and also tremor. She let Dr. Carles Collet know, and was told it fine to take with the levodopa. No known side effects.  No change in moods yet.  PMH, PSH, SH reviewed  Outpatient Encounter Medications as of 08/21/2018  Medication Sig Note  . ALPRAZolam (XANAX) 0.5 MG tablet TAKE BY MOUTH UP TO 3 TIMES DAILY. WARNING: BENZODIAZEPINES INCREASE THE RISK OF FALLS AND NEUROCOGNITIVE DECLINE IN THE ELDERLY   . b complex vitamins capsule Take 1 capsule by mouth as needed.  10/26/2016: daily  . Carbidopa-Levodopa ER (SINEMET CR) 25-100 MG tablet controlled release TAKE 1 TABLET BY MOUTH THREE TIMES DAILY   . Cholecalciferol (VITAMIN D) 2000 UNITS tablet Take 2,000 Units by mouth daily. 05/04/2015: Taking daily  . Probiotic Product (ALIGN PO) Take 1 capsule by mouth every other day.    Marland Kitchen QUEtiapine (SEROQUEL) 25 MG tablet Take 25 mg by mouth at bedtime.   Marland Kitchen SYNTHROID 25 MCG tablet Take 1 tablet (25 mcg total) by mouth daily before breakfast.   . vitamin B-12 (CYANOCOBALAMIN) 1000 MCG tablet Take 1,000 mcg by mouth daily.   . vitamin E 400 UNIT capsule Take 400 Units by mouth every Monday, Wednesday, and Friday. Reported on 09/06/2015   . acetaminophen (TYLENOL) 500 MG tablet Take 500 mg by mouth every 6 (six) hours as needed.   Marland Kitchen ibuprofen (ADVIL,MOTRIN) 200 MG tablet Take 200 mg by mouth every 6 (six) hours as needed.    No  facility-administered encounter medications on file as of 08/21/2018.    Allergies  Allergen Reactions  . Iodine Anaphylaxis    IV and topical forms.  Burnard Leigh [Hyoscyamine Sulfate]     Vision problems/pt has glaucoma  . Salmon [Fish Allergy] Hives and Shortness Of Breath  . Shellfish Allergy Anaphylaxis  . Remeron [Mirtazapine] Other (See Comments)    Cause blurred vision and red eyes, pt has glaucoma  . Aspirin Other (See Comments)    Sever stomach pain due to ulcer scaring.  . Ciprofloxacin Diarrhea  . Codeine Nausea And Vomiting  . Darvocet [Propoxyphene N-Acetaminophen] Nausea And Vomiting  . Demerol [Meperidine] Nausea Only  . Dexilant [Dexlansoprazole] Swelling    Redness, swelling and peeling of both feet.  . Diphedryl [Diphenhydramine] Other (See Comments)    Increased pulse/small amount ok  . Doxycycline Hyclate Other (See Comments)    GI intolerance.  Marland Kitchen Epinephrine Other (See Comments)    Breathing problems  . Erythromycin Other (See Comments)    GI intolerance.  Yvette Rack [Cyclobenzaprine] Other (See Comments)    Tingly/prickly sensation.  Marland Kitchen Keflex [Cephalexin] Hives  . Latex Other (See Comments)    Gloves ok.  Skin gets red from elastic in underwear and latex bandaides.  . Prednisone Other (See Comments)    Headache  . Pylera [Bis Subcit-Metronid-Tetracyc] Swelling    Tongue swelling. Face  tingling  . Sulfa Antibiotics Other (See Comments)    Increased pulse, fainting, diarrhea, thrush  . Xylocaine [Lidocaine Hcl]     With epinephrine, given by dentist.  Speeded up heart rate and she passed out (occured twice, at dentist)  . Zoloft [Sertraline Hcl] Swelling and Other (See Comments)    Migraine Swelling of tongue/lip (09/2012)  . Advil [Ibuprofen] Other (See Comments)    Motrin ok with a GI effect.  . Clarithromycin Rash    Started after completing 10 day course of 2000 mg /day   ROS: No fever, headache, dizziness, chest pain, shortness of breath. No  nausea, vomiting, diarrhea. Not currently in a lot of pain. +stress  PHYSICAL EXAM:  BP 140/70   Pulse 72   Temp 98.7 F (37.1 C) (Tympanic)   Ht 4\' 11"  (1.499 m)   Wt 109 lb 12.8 oz (49.8 kg)   BMI 22.18 kg/m   Well-appearing, pleasant female, in good spirits, accompanied by her husband (also in good spirits).  HEENT: PERRL, EOMI, conjunctiva and sclera are clear.  TM's and EAC's are normal. Nasal mucosa is mildly edematous on the right, with clear mucus. Mild-mod on the left.  Sinuses are nontender.  OP is notable for some erythema posteriorly.  No other redness or lesions noted.  Moist mucus membranes. Neck: no lymphadenopathy Heart: regular rate and rhythm Lungs: clear bilaterally Skin: no rash, normal turgor Psych: normal mood, affect, hygiene and grooming Neuro: alert and oriented, cranial nerves intact, normal gait.  Rapid strep negative  ASSESSMENT/PLAN:  Sore throat - suspect related to PND; no evidence of infection. Trial claritin; other supportive measures reviewed. - Plan: Rapid Strep A     You do not have strep throat.  I think your pain is from postnasal drainage. Try and sleep with the head elevated.  Consider using claritin to help dry up the mucus. Continue salt water gargles. You can use tylenol as needed for the pain.  Contact us if you develop fever, discolored mucus, sinus pain, or other new symptoms.

## 2018-08-28 ENCOUNTER — Encounter: Payer: Self-pay | Admitting: Family Medicine

## 2018-08-28 NOTE — Progress Notes (Signed)
Chief Complaint  Patient presents with  . Medicare Wellness    fasting AWV no pap-seeing Dr.Grewal at the end of the month. Still having issues with her throat, did not go away. She has general, overall hurting. When she sleeps at night her left breast area hurts.   . Form    has mental competency form to be filled out from attny.    Tracy Malone is a 85 y.o. female who presents for annual wellness visit and follow-up on chronic medical conditions.  She has the following concerns:  She was recently seen with sore throat. Se has some ongoing issues with discomfort on the right side and some postnasal drainage.  She forgot to take claritin yesterday and had more drainage in her throat this morning.  Not any worse.  Hypothyroidism: She reports compliance with taking her Synthroid 35mg daily, on an empty stomach, separate from her other medications.  She denies any significant changes to hair/skin/bowels/energy. She has chronic dryness to her skin, mild hair loss, fatigue and depression, no recent changes. Complains of fatigue, depression.  Lab Results  Component Value Date   TSH 1.810 10/04/2017   Mild carotid artery disease--intolerant of a statin in the past, and had been unwilling to try additional statins. Recheck of u/s in 2013 was stable, minimal disease (216-96%stenosis of LICA, <<78%stenosis of RICA). Denies neuro symptoms--no headaches, dizziness, numbness (only in right 4th and 5th fingertips from a mandolin injury), tingling, changes in speech, or other concerns. (just tremor related to PD).  Depression:  Under the care of Dr. EDaron Offer   Recently started selegiline, about 2 weeks ago, hasn't really noticed improvement.  Has a little bit of lightheadedness at times. She plans to discuss this with him, and also to discuss repeating TCullen  Has upcoming appointment. She reports being very depressed and upset about her son, CGerald Stabs   She reports that he is in trouble for possible child  pornopgraphy--someone else used his computer, that he is innocent. Court date March; dragging on x 3.5 years.  She worries constantly about this, and cries frequently.  Immunization History  Administered Date(s) Administered  . Influenza Split 06/22/2015  . Influenza, High Dose Seasonal PF 06/13/2013, 04/28/2014, 05/12/2017, 04/17/2018  . Influenza-Unspecified 05/17/2016  . Pneumococcal Conjugate-13 09/29/2013  . Tdap 09/23/2009   Last Pap smear: 03/2015 with Dr. GHelane Rima has upcoming appointment Last mammogram: 05/2018 Last colonoscopy: 2006; negative Cologard 04/2015 and 05/2018 Last DEXA: done through Dr. HDarden Amber SDossie Der(known osteoporosis)--we don't have record Carotid u/s 07/2011 (no significant stenosis, stable/unchanged, 20-30% at [facility]) Dentist: twice yearly Ophtho: yearly Exercise: had to stop using exercise bike due to bursitis, plans to restart.  She had been using it daily , for 30 mins daily (15 twice daily or 30 mins at once).  Lipids: Lab Results  Component Value Date   CHOL 218 (H) 11/23/2016   HDL 78 11/23/2016   LDLCALC 115 (H) 11/23/2016   TRIG 123 11/23/2016   CHOLHDL 2.8 11/23/2016    Other doctors caring for this patient include:  GYN: Dr. GHelane RimaRheum: Dr. SDossie Der(previously Dr. HTrudie Reed Ophtho: Dr. TTora KindredDentist: Dr. WSandria BalesGI: Dr. JArdis HughsCardiology: Dr. NJohnsie CancelDerm: Dr. SDerrel NipUrologist: Dr. BPilar Jarvis(doesn't plan to see him again) Psychiatrist: Dr. ESharyon MedicusPsychologist:  Dr. AOuida SillsPodiatry: Dr. GElisha PonderOrtho: Dr. BEugenie Filler(or ankle) Neuro: Dr. TCarles Collet(previously also saw Dr. SFelecia Shelling Veins: Dr. FRenaldo Reel End of Life: She has a healthcare power of attorney  and living will.  Depression screening: PHQ-2 score of 4; she is under care of psychiatrist and therapist. Fall screen: negative Functional Status survey: notable for some recent depth perception issue (unsure if related to her glasses or new meds); no longer  drives Mini-Cog screen: normal (perfect circle, no evidence of tremor) See epic for full screens.  Past Medical History:  Diagnosis Date  . Bell's palsy 1966   ? right side facial droop  . Carotid artery disease ([place]) 2010   on vascular screening;unchanged 2013.(could not tolerate simvastatin, no other statins tried)--<30% blockage bilat 07/2011  . Chronic abdominal pain   . Chronic fatigue and malaise   . Claustrophobia   . Depression    treated in the past for years;stopped in 2010 for a years  . Duodenal ulcer 1962   h/o  . Dysrhythmia    ocassional PVC's  . Fibromyalgia   . Frequent PVCs 07/2012   Seen by [facility]: benign, asymptomatic, normal EF  . GERD (gastroesophageal reflux disease)   . Glaucoma, narrow-angle    s/p laser surgery  . History of hiatal hernia    during endoscopy  . Hypothyroid 9/08  . IBS (irritable bowel syndrome)    Dr. Benson Norway  . Ocular migraine   . Osteoporosis 10/11   Dr.Hawkes  . Panic attack   . Recurrent UTI    has cystocele-Dr.Grewal  . Shingles 1999   h/o  . Superficial thrombophlebitis 03/2009   RLE  . Trochanteric bursitis 12/2008   bilateral    Past Surgical History:  Procedure Laterality Date  . ABDOMINAL HYSTERECTOMY    . CATARACT EXTRACTION, BILATERAL  1995, 1996  . Flexible sigmoidoscopy    . THYROIDECTOMY, PARTIAL  09/2005   L nodule; Dr. Harlow Asa  . TONSILLECTOMY  1946  . UPPER GI ENDOSCOPY  06/27/12  . VAGINAL HYSTERECTOMY  1971   and bladder repair.  Still has ovaries    Social History   Socioeconomic History  . Marital status: Married    Spouse name: Not on file  . Number of children: 2  . Years of education: Not on file  . Highest education level: Not on file  Occupational History  . Occupation: retired (school system)  Social Needs  . Financial resource strain: Not on file  . Food insecurity:    Worry: Not on file    Inability: Not on file  . Transportation needs:    Medical: Not on file     Non-medical: Not on file  Tobacco Use  . Smoking status: Never Smoker  . Smokeless tobacco: Never Used  Substance and Sexual Activity  . Alcohol use: No    Alcohol/week: 0.0 standard drinks  . Drug use: No  . Sexual activity: Not Currently    Partners: Male  Lifestyle  . Physical activity:    Days per week: Not on file    Minutes per session: Not on file  . Stress: Not on file  Relationships  . Social connections:    Talks on phone: Not on file    Gets together: Not on file    Attends religious service: Not on file    Active member of club or organization: Not on file    Attends meetings of clubs or organizations: Not on file    Relationship status: Not on file  . Intimate partner violence:    Fear of current or ex partner: Not on file    Emotionally abused: Not on file  Physically abused: Not on file    Forced sexual activity: Not on file  Other Topics Concern  . Not on file  Social History Narrative   Married.  Son lives in [place]; Daughter [name] lives in [place]; 2 grandchildren    Family History  Problem Relation Age of Onset  . Heart disease Mother   . Hypertension Mother   . Hypertension Sister   . HIV Son   . Heart disease Brother   . Lung cancer Brother        lung  . Diabetes Maternal Grandfather   . Diabetes Granddaughter        type 1    Outpatient Encounter Medications as of 08/29/2018  Medication Sig Note  . acetaminophen (TYLENOL) 500 MG tablet Take 500 mg by mouth every 6 (six) hours as needed.   . ALPRAZolam (XANAX) 0.5 MG tablet TAKE BY MOUTH UP TO 3 TIMES DAILY. WARNING: BENZODIAZEPINES INCREASE THE RISK OF FALLS AND NEUROCOGNITIVE DECLINE IN THE ELDERLY   . b complex vitamins capsule Take 1 capsule by mouth as needed.  10/26/2016: daily  . Carbidopa-Levodopa ER (SINEMET CR) 25-100 MG tablet controlled release TAKE 1 TABLET BY MOUTH THREE TIMES DAILY   . Cholecalciferol (VITAMIN D) 2000 UNITS tablet Take 2,000 Units by mouth daily. 05/04/2015:  Taking daily  . loratadine (CLARITIN) 10 MG tablet Take 10 mg by mouth daily.   . Probiotic Product (ALIGN PO) Take 1 capsule by mouth every other day.    . selegiline (ELDEPRYL) 5 MG capsule Take 5 mg by mouth daily. 08/21/2018: Takes in the morning; prescribed by her psychiatrist, started last week.  Marland Kitchen SYNTHROID 25 MCG tablet Take 1 tablet (25 mcg total) by mouth daily before breakfast.   . vitamin B-12 (CYANOCOBALAMIN) 1000 MCG tablet Take 1,000 mcg by mouth daily.   . vitamin E 400 UNIT capsule Take 400 Units by mouth every Monday, Wednesday, and Friday. Reported on 09/06/2015   . QUEtiapine (SEROQUEL) 25 MG tablet Take 25 mg by mouth at bedtime. 08/29/2018: Uses prn  . [DISCONTINUED] ibuprofen (ADVIL,MOTRIN) 200 MG tablet Take 200 mg by mouth every 6 (six) hours as needed.    No facility-administered encounter medications on file as of 08/29/2018.     Allergies  Allergen Reactions  . Iodine Anaphylaxis    IV and topical forms.  Burnard Leigh [Hyoscyamine Sulfate]     Vision problems/pt has glaucoma  . Salmon [Fish Allergy] Hives and Shortness Of Breath  . Shellfish Allergy Anaphylaxis  . Remeron [Mirtazapine] Other (See Comments)    Cause blurred vision and red eyes, pt has glaucoma  . Aspirin Other (See Comments)    Sever stomach pain due to ulcer scaring.  . Ciprofloxacin Diarrhea  . Codeine Nausea And Vomiting  . Darvocet [Propoxyphene N-Acetaminophen] Nausea And Vomiting  . Demerol [Meperidine] Nausea Only  . Dexilant [Dexlansoprazole] Swelling    Redness, swelling and peeling of both feet.  . Diphedryl [Diphenhydramine] Other (See Comments)    Increased pulse/small amount ok  . Doxycycline Hyclate Other (See Comments)    GI intolerance.  Marland Kitchen Epinephrine Other (See Comments)    Breathing problems  . Erythromycin Other (See Comments)    GI intolerance.  Yvette Rack [Cyclobenzaprine] Other (See Comments)    Tingly/prickly sensation.  Marland Kitchen Keflex [Cephalexin] Hives  . Latex Other (See  Comments)    Gloves ok.  Skin gets red from elastic in underwear and latex bandaides.  . Prednisone Other (See Comments)  Headache  . Pylera [Bis Subcit-Metronid-Tetracyc] Swelling    Tongue swelling. Face tingling  . Sulfa Antibiotics Other (See Comments)    Increased pulse, fainting, diarrhea, thrush  . Xylocaine [Lidocaine Hcl]     With epinephrine, given by dentist.  Speeded up heart rate and she passed out (occured twice, at dentist)  . Zoloft [Sertraline Hcl] Swelling and Other (See Comments)    Migraine Swelling of tongue/lip (09/2012)  . Advil [Ibuprofen] Other (See Comments)    Motrin ok with a GI effect.  . Clarithromycin Rash    Started after completing 10 day course of 2000 mg /day    ROS: The patient denies fever, headaches, decreased hearing, breast concerns (some pain when she sleeps on left side, previously evaluated with [place]), chest pain, dizziness (some LH since starting selegiline), syncope, cough, swelling, nausea, vomiting, diarrhea, abdominal pain, melena, hematochezia, hematuria, incontinence, dysuria, vaginal bleeding, discharge, odor or itch, genital lesions, numbness (just fingertips due to injury, 4th and 5th on R), tingling, weakness, suspicious skin lesions, abnormal bleeding, or enlarged lymph nodes. Some easy bruising. Only rare heartburn after certain foods. Fibromyalgia--stable, chronic pain in shoulder/upper back, and slight in lower back. Worse on a rainy day (today), overall is usually 2-3/10, tylenol helps. Depression--not well controlled, has f/u with psychiatrist on Monday.  Cries easily (family stress related to her son). No longer eats large meals, helps prevent bloating.  Bowels are normal, no longer needing miralax regularly. Tremor, R hand, worse when nervous, under care of Dr. Carles Collet for Parkinson's. Ankle pain and swelling resolved (bilaterally) Hip pain improved after cortisone shots (bilaterally) for bursitis. +insomnia--trouble staying asleep,  and getting back to sleep after going to the bathroom. Able to fall asleep okay, takes a little while. Some slight swelling in her knees, denies significant pain, occasional ache. Urinary frequency attributed to drinking lots of water. Hasn't had UTI in a long time.    PHYSICAL EXAM:  BP 134/70   Pulse 68   Ht 4' 11.5" (1.511 m)   Wt 108 lb 12.8 oz (49.4 kg)   BMI 21.61 kg/m   Wt Readings from Last 3 Encounters:  08/29/18 108 lb 12.8 oz (49.4 kg)  08/21/18 109 lb 12.8 oz (49.8 kg)  07/01/18 114 lb 6.4 oz (51.9 kg)    General Appearance:  Alert, cooperative, thin elderly female in no distress. Some throat-clearing during the visit.  Intermittently tearful in discussing her son, worry.  Head:  Normocephalic, without obvious abnormality, atraumatic  Eyes:  PERRL, conjunctiva/corneas clear, EOM's intact, fundi  benign  Ears:  Normal TM's and external ear canals  Nose: Nares normal, mucosa is mild-moderately edematous, no drainage or sinus tenderness  Throat: Lips, mucosa, and tongue normal; teeth and gums normal  Neck: Supple, no lymphadenopathy; thyroid: no enlargement/ tenderness/nodules; no carotid bruit or JVD. WHSS anterior neck  Back:  Spine nontender, no curvature, ROM normal, no CVA tenderness. No trigger points.  Lungs:  Clear to auscultation bilaterally without wheezes, rales or ronchi; respirations unlabored  Chest Wall:  No deformity; mildly tender at costochondral junctions on the left at 2-3 levels (reproducing the discomfort she feels when in bed at night)  Heart:  Regular rate and rhythm, S1 and S2 normal, no murmur, rub or gallop   Breast Exam:  Deferred to GYN  Abdomen:  Soft, nontender, nondistended, normoactive bowel sounds, no masses, no hepatosplenomegaly.  Genitalia:  Deferred to GYN     Extremities: No clubbing, cyanosis or edema  Pulses: 2+  and symmetric all extremities  Skin: Skin color, texture, turgor  normal. No rashes/lesions.   Lymph nodes: Cervical, supraclavicular, and axillary nodes normal  Neurologic: CNII-XII intact, normal strength, sensation and gait; reflexes 2+ and symmetric throughout. Resting right hand tremor.  Psych: Depressed and anxious mood, full range of affect (crying and laughing/smiling at different points during the visit), normal hygiene and grooming.Normal eye contact and speech    ASSESSMENT/PLAN:  Medicare annual wellness visit, subsequent  Hypothyroidism, unspecified type - euthyroid by history; recheck TSH - Plan: TSH  Parkinson disease ([place]) - under the care of Dr. Carles Collet.    Varicose veins of both legs with edema - Edema has resolved; Under the care of Dr. Renaldo Reel. Continue compression stockings  Irritable bowel syndrome, unspecified type - stable/improved. This is the first time I recall her abdomen being nontender on exam  Fatigue, unspecified type - Plan: Comprehensive metabolic panel, CBC with Differential/Platelet, TSH  Stenosis of right carotid artery - minimal, with no progression on recheck in 2013. No bruit, no symptoms. Will not recheck at this time - Plan: Lipid panel  Immunization due - risks/SE and need for pneumovax reviewed.  Encouraged Shingrix from pharmacy, risks/SE reviewed - Plan: Pneumococcal polysaccharide vaccine 23-valent greater than or equal to 2yo subcutaneous/IM  Severe recurrent major depression without psychotic features [facility]) - continue seeing therapist and psychiatrist. Counseled some today. +stress related to son, worse now with upcoming court date  Chest wall pain - mild costochondritis.  Rec heat. Pt w/fibromyalgia. No other significant pain currently; bursitis resolved with cortisone injection    c-met, CBC, TSH, lipids  Discussed monthly self breast exams and yearly mammograms; at least 30 minutes of aerobic activity at least 5 days/week, weight-bearing exercise at least  2-3x/week; proper sunscreen use reviewed; healthy diet, including goals of calcium and vitamin D intake and alcohol recommendations (less than or equal to 1 drink/day) reviewed; regular seatbelt use; changing batteries in smoke detectors. Immunization recommendations discussed--pneumovax given today. Continue yearly high dose flu shots. Shingrix recommended, risks/side effects reviewed, to get from pharmacy. Will need tetanus booster next year (from pharmacy). Colon cancer screening recommendations reviewed--UTD, cologuard recently negative   MOST form reviewed, Full Code, Full Care  ROR Dr. [name] (rheum), OV notes, labs and DEXA Pt reports showed osteoporosis, and that she didn't tolerate fosamax.  I'd like to see results, as she is asking my opinion.  FFO stating that she is competent. While she is dealing with significant depression, there is nothing that disrupts her ability to make decision (about her health, finances or otherwise).   Medicare Attestation I have personally reviewed: The patient's medical and social history Their use of alcohol, tobacco or illicit drugs Their current medications and supplements The patient's functional ability including ADLs,fall risks, home safety risks, cognitive, and hearing and visual impairment Diet and physical activities Evidence for depression or mood disorders  The patient's weight, height and BMI have been recorded in the chart.  I have made referrals, counseling, and provided education to the patient based on review of the above and I have provided the patient with a written personalized care plan for preventive services.

## 2018-08-29 ENCOUNTER — Ambulatory Visit (INDEPENDENT_AMBULATORY_CARE_PROVIDER_SITE_OTHER): Payer: Medicare Other | Admitting: Family Medicine

## 2018-08-29 ENCOUNTER — Encounter: Payer: Self-pay | Admitting: Family Medicine

## 2018-08-29 VITALS — BP 134/70 | HR 68 | Ht 59.5 in | Wt 108.8 lb

## 2018-08-29 DIAGNOSIS — R0789 Other chest pain: Secondary | ICD-10-CM | POA: Diagnosis not present

## 2018-08-29 DIAGNOSIS — Z0000 Encounter for general adult medical examination without abnormal findings: Secondary | ICD-10-CM | POA: Diagnosis not present

## 2018-08-29 DIAGNOSIS — Z23 Encounter for immunization: Secondary | ICD-10-CM | POA: Diagnosis not present

## 2018-08-29 DIAGNOSIS — G20 Parkinson's disease: Secondary | ICD-10-CM

## 2018-08-29 DIAGNOSIS — R5383 Other fatigue: Secondary | ICD-10-CM

## 2018-08-29 DIAGNOSIS — I6521 Occlusion and stenosis of right carotid artery: Secondary | ICD-10-CM | POA: Diagnosis not present

## 2018-08-29 DIAGNOSIS — F332 Major depressive disorder, recurrent severe without psychotic features: Secondary | ICD-10-CM | POA: Diagnosis not present

## 2018-08-29 DIAGNOSIS — I83893 Varicose veins of bilateral lower extremities with other complications: Secondary | ICD-10-CM | POA: Diagnosis not present

## 2018-08-29 DIAGNOSIS — K589 Irritable bowel syndrome without diarrhea: Secondary | ICD-10-CM | POA: Diagnosis not present

## 2018-08-29 DIAGNOSIS — E039 Hypothyroidism, unspecified: Secondary | ICD-10-CM

## 2018-08-29 NOTE — Patient Instructions (Addendum)
  HEALTH MAINTENANCE RECOMMENDATIONS:  It is recommended that you get at least 30 minutes of aerobic exercise at least 5 days/week (for weight loss, you may need as much as 60-90 minutes). This can be any activity that gets your heart rate up. This can be divided in 10-15 minute intervals if needed, but try and build up your endurance at least once a week.  Weight bearing exercise is also recommended twice weekly.  Eat a healthy diet with lots of vegetables, fruits and fiber.  "Colorful" foods have a lot of vitamins (ie green vegetables, tomatoes, red peppers, etc).  Limit sweet tea, regular sodas and alcoholic beverages, all of which has a lot of calories and sugar.  Up to 1 alcoholic drink daily may be beneficial for women (unless trying to lose weight, watch sugars).  Drink a lot of water.  Calcium recommendations are 1200-1500 mg daily (1500 mg for postmenopausal women or women without ovaries), and vitamin D 1000 IU daily.  This should be obtained from diet and/or supplements (vitamins), and calcium should not be taken all at once, but in divided doses.  Monthly self breast exams and yearly mammograms for women over the age of 16 is recommended.  Sunscreen of at least SPF 30 should be used on all sun-exposed parts of the skin when outside between the hours of 10 am and 4 pm (not just when at beach or pool, but even with exercise, golf, tennis, and yard work!)  Use a sunscreen that says "broad spectrum" so it covers both UVA and UVB rays, and make sure to reapply every 1-2 hours.  Remember to change the batteries in your smoke detectors when changing your clock times in the spring and fall.  Use your seat belt every time you are in a car, and please drive safely and not be distracted with cell phones and texting while driving.   Tracy Malone , Thank you for taking time to come for your Medicare Wellness Visit. I appreciate your ongoing commitment to your health goals. Please review the following  plan we discussed and let me know if I can assist you in the future.    This is a list of the screening recommended for you and due dates:  Health Maintenance  Topic Date Due  . Pneumonia vaccines (2 of 2 - PPSV23) 09/30/2014  . Tetanus Vaccine  09/24/2019  . Flu Shot  Completed  . DEXA scan (bone density measurement)  Completed   You were given the last pneumonia vaccine today.  I recommend getting the new shingles vaccine (Shingrix). You will need to check with your insurance to see if it is covered, and if covered by Medicare Part D, you need to get from the pharmacy rather than our office.  It is a series of 2 injections, spaced 2 months apart.

## 2018-08-30 LAB — CBC WITH DIFFERENTIAL/PLATELET
Basophils Absolute: 0 10*3/uL (ref 0.0–0.2)
Basos: 0 %
EOS (ABSOLUTE): 0.2 10*3/uL (ref 0.0–0.4)
EOS: 1 %
HEMATOCRIT: 46 % (ref 34.0–46.6)
Hemoglobin: 15.8 g/dL (ref 11.1–15.9)
Immature Grans (Abs): 0.2 10*3/uL — ABNORMAL HIGH (ref 0.0–0.1)
Immature Granulocytes: 1 %
Lymphocytes Absolute: 2.1 10*3/uL (ref 0.7–3.1)
Lymphs: 15 %
MCH: 30.5 pg (ref 26.6–33.0)
MCHC: 34.3 g/dL (ref 31.5–35.7)
MCV: 89 fL (ref 79–97)
Monocytes Absolute: 1 10*3/uL — ABNORMAL HIGH (ref 0.1–0.9)
Monocytes: 8 %
Neutrophils Absolute: 10.2 10*3/uL — ABNORMAL HIGH (ref 1.4–7.0)
Neutrophils: 75 %
Platelets: 189 10*3/uL (ref 150–450)
RBC: 5.18 x10E6/uL (ref 3.77–5.28)
RDW: 13.4 % (ref 11.7–15.4)
WBC: 13.6 10*3/uL — ABNORMAL HIGH (ref 3.4–10.8)

## 2018-08-30 LAB — COMPREHENSIVE METABOLIC PANEL
ALK PHOS: 78 IU/L (ref 39–117)
ALT: 26 IU/L (ref 0–32)
AST: 22 IU/L (ref 0–40)
Albumin/Globulin Ratio: 1.7 (ref 1.2–2.2)
Albumin: 4.3 g/dL (ref 3.6–4.6)
BUN/Creatinine Ratio: 20 (ref 12–28)
BUN: 15 mg/dL (ref 8–27)
Bilirubin Total: 1.1 mg/dL (ref 0.0–1.2)
CO2: 23 mmol/L (ref 20–29)
CREATININE: 0.76 mg/dL (ref 0.57–1.00)
Calcium: 9.6 mg/dL (ref 8.7–10.3)
Chloride: 102 mmol/L (ref 96–106)
GFR calc Af Amer: 83 mL/min/{1.73_m2} (ref >59)
GFR calc non Af Amer: 72 mL/min/{1.73_m2} (ref >59)
Globulin, Total: 2.6 g/dL (ref 1.5–4.5)
Glucose: 91 mg/dL (ref 65–99)
Potassium: 4.8 mmol/L (ref 3.5–5.2)
Sodium: 143 mmol/L (ref 134–144)
Total Protein: 6.9 g/dL (ref 6.0–8.5)

## 2018-08-30 LAB — LIPID PANEL
Chol/HDL Ratio: 2.6 ratio (ref 0.0–4.4)
Cholesterol, Total: 233 mg/dL — ABNORMAL HIGH (ref 100–199)
HDL: 91 mg/dL (ref >39)
LDL CALC: 116 mg/dL — AB (ref 0–99)
Triglycerides: 128 mg/dL (ref 0–149)
VLDL Cholesterol Cal: 26 mg/dL (ref 5–40)

## 2018-08-30 LAB — TSH: TSH: 1.65 u[IU]/mL (ref 0.450–4.500)

## 2018-08-31 ENCOUNTER — Encounter: Payer: Self-pay | Admitting: Family Medicine

## 2018-09-02 ENCOUNTER — Other Ambulatory Visit: Payer: Self-pay | Admitting: *Deleted

## 2018-09-02 DIAGNOSIS — G20 Parkinson's disease: Secondary | ICD-10-CM | POA: Diagnosis not present

## 2018-09-02 DIAGNOSIS — D72829 Elevated white blood cell count, unspecified: Secondary | ICD-10-CM

## 2018-09-02 DIAGNOSIS — F5101 Primary insomnia: Secondary | ICD-10-CM | POA: Diagnosis not present

## 2018-09-02 DIAGNOSIS — F410 Panic disorder [episodic paroxysmal anxiety] without agoraphobia: Secondary | ICD-10-CM | POA: Diagnosis not present

## 2018-09-03 ENCOUNTER — Ambulatory Visit (INDEPENDENT_AMBULATORY_CARE_PROVIDER_SITE_OTHER): Payer: Medicare Other | Admitting: Psychiatry

## 2018-09-03 ENCOUNTER — Ambulatory Visit: Payer: Medicare Other | Admitting: Podiatry

## 2018-09-03 DIAGNOSIS — F411 Generalized anxiety disorder: Secondary | ICD-10-CM | POA: Diagnosis not present

## 2018-09-05 DIAGNOSIS — L853 Xerosis cutis: Secondary | ICD-10-CM | POA: Diagnosis not present

## 2018-09-05 DIAGNOSIS — L821 Other seborrheic keratosis: Secondary | ICD-10-CM | POA: Diagnosis not present

## 2018-09-05 DIAGNOSIS — D225 Melanocytic nevi of trunk: Secondary | ICD-10-CM | POA: Diagnosis not present

## 2018-09-05 DIAGNOSIS — L820 Inflamed seborrheic keratosis: Secondary | ICD-10-CM | POA: Diagnosis not present

## 2018-09-05 DIAGNOSIS — D1801 Hemangioma of skin and subcutaneous tissue: Secondary | ICD-10-CM | POA: Diagnosis not present

## 2018-09-09 ENCOUNTER — Telehealth: Payer: Self-pay | Admitting: *Deleted

## 2018-09-09 NOTE — Telephone Encounter (Signed)
Contacted and spoke with patient to arrange a home visit for this month. Visit scheduled for 09/12/18 at 1p.

## 2018-09-11 ENCOUNTER — Ambulatory Visit (INDEPENDENT_AMBULATORY_CARE_PROVIDER_SITE_OTHER): Payer: Medicare Other | Admitting: Podiatry

## 2018-09-11 DIAGNOSIS — M79675 Pain in left toe(s): Secondary | ICD-10-CM | POA: Diagnosis not present

## 2018-09-11 DIAGNOSIS — M79674 Pain in right toe(s): Secondary | ICD-10-CM

## 2018-09-11 DIAGNOSIS — L84 Corns and callosities: Secondary | ICD-10-CM

## 2018-09-11 DIAGNOSIS — B351 Tinea unguium: Secondary | ICD-10-CM

## 2018-09-11 NOTE — Patient Instructions (Signed)
Onychomycosis/Fungal Toenails  WHAT IS IT? An infection that lies within the keratin of your nail plate that is caused by a fungus.  WHY ME? Fungal infections affect all ages, sexes, races, and creeds.  There may be many factors that predispose you to a fungal infection such as age, coexisting medical conditions such as diabetes, or an autoimmune disease; stress, medications, fatigue, genetics, etc.  Bottom line: fungus thrives in a warm, moist environment and your shoes offer such a location.  IS IT CONTAGIOUS? Theoretically, yes.  You do not want to share shoes, nail clippers or files with someone who has fungal toenails.  Walking around barefoot in the same room or sleeping in the same bed is unlikely to transfer the organism.  It is important to realize, however, that fungus can spread easily from one nail to the next on the same foot.  HOW DO WE TREAT THIS?  There are several ways to treat this condition.  Treatment may depend on many factors such as age, medications, pregnancy, liver and kidney conditions, etc.  It is best to ask your doctor which options are available to you.  1. No treatment.   Unlike many other medical concerns, you can live with this condition.  However for many people this can be a painful condition and may lead to ingrown toenails or a bacterial infection.  It is recommended that you keep the nails cut short to help reduce the amount of fungal nail. 2. Topical treatment.  These range from herbal remedies to prescription strength nail lacquers.  About 40-50% effective, topicals require twice daily application for approximately 9 to 12 months or until an entirely new nail has grown out.  The most effective topicals are medical grade medications available through physicians offices. 3. Oral antifungal medications.  With an 80-90% cure rate, the most common oral medication requires 3 to 4 months of therapy and stays in your system for a year as the new nail grows out.  Oral  antifungal medications do require blood work to make sure it is a safe drug for you.  A liver function panel will be performed prior to starting the medication and after the first month of treatment.  It is important to have the blood work performed to avoid any harmful side effects.  In general, this medication safe but blood work is required. 4. Laser Therapy.  This treatment is performed by applying a specialized laser to the affected nail plate.  This therapy is noninvasive, fast, and non-painful.  It is not covered by insurance and is therefore, out of pocket.  The results have been very good with a 80-95% cure rate.  [facility] is the only practice in the area to offer this therapy. Permanent Nail Avulsion.  Removing the entire nail so that a new nail will not grow back.Corns and Calluses Corns are small areas of thickened skin that occur on the top, sides, or tip of a toe. They contain a cone-shaped core with a point that can press on a nerve below. This causes pain.  Calluses are areas of thickened skin that can occur anywhere on the body, including the hands, fingers, palms, soles of the feet, and heels. Calluses are usually larger than corns. What are the causes? Corns and calluses are caused by rubbing (friction) or pressure, such as from shoes that are too tight or do not fit properly. What increases the risk? Corns are more likely to develop in people who have misshapen   toes (toe deformities), such as hammer toes. Calluses can occur with friction to any area of the skin. They are more likely to develop in people who:  Work with their hands.  Wear shoes that fit poorly, are too tight, or are high-heeled.  Have toe deformities. What are the signs or symptoms? Symptoms of a corn or callus include:  A hard growth on the skin.  Pain or tenderness under the skin.  Redness and swelling.  Increased discomfort while wearing tight-fitting shoes, if your feet are affected. If a  corn or callus becomes infected, symptoms may include:  Redness and swelling that gets worse.  Pain.  Fluid, blood, or pus draining from the corn or callus. How is this diagnosed? Corns and calluses may be diagnosed based on your symptoms, your medical history, and a physical exam. How is this treated? Treatment for corns and calluses may include:  Removing the cause of the friction or pressure. This may involve: ? Changing your shoes. ? Wearing shoe inserts (orthotics) or other protective layers in your shoes, such as a corn pad. ? Wearing gloves.  Applying medicine to the skin (topical medicine) to help soften skin in the hardened, thickened areas.  Removing layers of dead skin with a file to reduce the size of the corn or callus.  Removing the corn or callus with a scalpel or laser.  Taking antibiotic medicines, if your corn or callus is infected.  Having surgery, if a toe deformity is the cause. Follow these instructions at home:   Take over-the-counter and prescription medicines only as told by your health care provider.  If you were prescribed an antibiotic, take it as told by your health care provider. Do not stop taking it even if your condition starts to improve.  Wear shoes that fit well. Avoid wearing high-heeled shoes and shoes that are too tight or too loose.  Wear any padding, protective layers, gloves, or orthotics as told by your health care provider.  Soak your hands or feet and then use a file or pumice stone to soften your corn or callus. Do this as told by your health care provider.  Check your corn or callus every day for symptoms of infection. Contact a health care provider if you:  Notice that your symptoms do not improve with treatment.  Have redness or swelling that gets worse.  Notice that your corn or callus becomes painful.  Have fluid, blood, or pus coming from your corn or callus.  Have new symptoms. Summary  Corns are small areas of  thickened skin that occur on the top, sides, or tip of a toe.  Calluses are areas of thickened skin that can occur anywhere on the body, including the hands, fingers, palms, and soles of the feet. Calluses are usually larger than corns.  Corns and calluses are caused by rubbing (friction) or pressure, such as from shoes that are too tight or do not fit properly.  Treatment may include wearing any padding, protective layers, gloves, or orthotics as told by your health care provider. This information is not intended to replace advice given to you by your health care provider. Make sure you discuss any questions you have with your health care provider. Document Released: 04/15/2004 Document Revised: 05/23/2017 Document Reviewed: 05/23/2017 Elsevier Interactive Patient Education  2019 Elsevier Inc.  

## 2018-09-12 ENCOUNTER — Other Ambulatory Visit: Payer: Self-pay | Admitting: Neurology

## 2018-09-12 ENCOUNTER — Other Ambulatory Visit: Payer: Medicare Other | Admitting: Licensed Clinical Social Worker

## 2018-09-12 ENCOUNTER — Other Ambulatory Visit: Payer: Medicare Other | Admitting: *Deleted

## 2018-09-12 DIAGNOSIS — Z515 Encounter for palliative care: Secondary | ICD-10-CM

## 2018-09-16 ENCOUNTER — Ambulatory Visit (INDEPENDENT_AMBULATORY_CARE_PROVIDER_SITE_OTHER): Payer: Medicare Other | Admitting: Family Medicine

## 2018-09-16 ENCOUNTER — Encounter: Payer: Self-pay | Admitting: Family Medicine

## 2018-09-16 VITALS — BP 120/70 | HR 80 | Temp 98.8°F | Ht 59.5 in | Wt 109.2 lb

## 2018-09-16 DIAGNOSIS — F332 Major depressive disorder, recurrent severe without psychotic features: Secondary | ICD-10-CM | POA: Diagnosis not present

## 2018-09-16 DIAGNOSIS — I6521 Occlusion and stenosis of right carotid artery: Secondary | ICD-10-CM | POA: Diagnosis not present

## 2018-09-16 DIAGNOSIS — J309 Allergic rhinitis, unspecified: Secondary | ICD-10-CM | POA: Diagnosis not present

## 2018-09-16 NOTE — Patient Instructions (Signed)
  Avoid using Flonase--it seems to irritate your nasal passages more than helping. Continue to use nasal saline as frequently as needed. There is no evidence of any significant recent nosebleed.  The "pink stuff" that you coughed up is likely just postnasal drainage (the same "pink stuff" that you blew out of your nose). There is no evidence of any infection or other problem.  Please try and find at least 30 minutes daily (if not more) to engage in an activity to keep your mind distracted and busy, preferably finding something you enjoy (ie puppy videos, "uplift" videos. Consider volunteering.  I briefly mentioned Freight [employer] or Out of the [employer] vs through [employer] or elsewhere. Continue with your counseling.

## 2018-09-16 NOTE — Progress Notes (Signed)
Chief Complaint  Patient presents with  . Epistaxis    noticed some light nose bleeding from the left side of her nose Sat. also some Sunday. Has been having cold and warm flushes at night-no fevers though.    She started using flonase after her last visit--helped with her runny nose and eye symptoms.  She only used it for 3 days. It burned a little after the third time, so she stopped using it. Last Friday she noted some increased nasal congestion on the left side, so used 1 spray of flonase on the left, and it burned. The next morning she noted "pink stuff" from her nose, and also expectorating pink mucus.  She started using nasal saline the following day, using it since, frequently. She had just a little pink mucus noted from the nose Sunday afternoon (yesterday), none since.  Denies itchy or watery eyes. No longer having the nasal congestion that she noted on Friday. No sinus pain. She wakes up with frontal headaches, feels better throughout the morning when upright.  Today she feels very tired, isn't sleeping well.  Melatonin helped her sleep until 2:30 last night. Sees psychiatrist next AFTER the court date. Seeing 2 therapists (one is through [place], and the one through [facility])  [facility], [place], [place] reviewed Allergies remain the same, reviewed  Outpatient Encounter Medications as of 09/16/2018  Medication Sig Note  . ALPRAZolam (XANAX) 0.5 MG tablet TAKE BY MOUTH UP TO 3 TIMES DAILY. WARNING: BENZODIAZEPINES INCREASE THE RISK OF FALLS AND NEUROCOGNITIVE DECLINE IN THE ELDERLY   . b complex vitamins capsule Take 1 capsule by mouth as needed.  10/26/2016: daily  . Carbidopa-Levodopa ER (SINEMET CR) 25-100 MG tablet controlled release TAKE ONE TABLET BY MOUTH THREE TIMES A DAY   . Cholecalciferol (VITAMIN D) 2000 UNITS tablet Take 2,000 Units by mouth daily. 05/04/2015: Taking daily  . loratadine (CLARITIN) 10 MG tablet Take 10 mg by mouth daily.   . Probiotic Product (ALIGN PO) Take 1 capsule by  mouth every other day.    . selegiline (ELDEPRYL) 5 MG capsule Take 5 mg by mouth daily. 08/21/2018: Takes in the morning; prescribed by her psychiatrist, started last week.  Marland Kitchen SYNTHROID 25 MCG tablet Take 1 tablet (25 mcg total) by mouth daily before breakfast.   . vitamin B-12 (CYANOCOBALAMIN) 1000 MCG tablet Take 1,000 mcg by mouth daily.   . vitamin E 400 UNIT capsule Take 400 Units by mouth every Monday, Wednesday, and Friday. Reported on 09/06/2015   . acetaminophen (TYLENOL) 500 MG tablet Take 500 mg by mouth every 6 (six) hours as needed.   Marland Kitchen QUEtiapine (SEROQUEL) 25 MG tablet Take 25 mg by mouth at bedtime. 08/29/2018: Uses prn   No facility-administered encounter medications on file as of 09/16/2018.    ROS: no fever, URI symptoms other than some nasal congestion, pink mucus and sinus headaches per HPI. No ear pain, sore throat, cough, shortness of breath, chest pain.  +fatigue, depression.  Some recurrent abdominal discomfort and foot pain.  Reports that the podiatrist "cut her" when debriding, a little sore still.  PHYSICAL EXAM:  BP 120/70   Pulse 80   Temp 98.8 F (37.1 C) (Tympanic)   Ht 4' 11.5" (1.511 m)   Wt 109 lb 3.2 oz (49.5 kg)   BMI 21.69 kg/m   Pleasant, elderly female, periodically smiling, but frequently crying as well  Full range of affect, but appears depressed.  Fair eye contact. Normal speech. Normal hygiene and  grooming HEENT: conjunctiva and sclera are clear. Nasal mucosa is mild-mod edematous, some clear mucus noted on the right. There is no erythema or purulence . There is no evidence of any bleeding or recent bleed on the left.  OP is clear Sinuses are nontender She is mildly tender over the right cheek muscles, extending to the jaw.  No lymphadenopathy No carotid bruit Heart: regular rate and rhythm Lungs: clear bilaterally Psych: depressed, anxious, tearful in discussing situation regarding her son. See above.  ASSESSMENT/PLAN:  Allergic rhinitis,  unspecified seasonality, unspecified trigger - slight bleeding/irritation exacerbated by Flonase. Stop Flonase. Continue frequent saline spray.  Severe recurrent major depression without psychotic features ([place]) - counseled in detail--stay distracted, find things to do which bring her joy, don't perseverate on the uncontrollable (son's future).   25 min visit, more than 1/2 spent counseling, regarding depression and anxiety.    Avoid using Flonase--it seems to irritate your nasal passages more than helping. Continue to use nasal saline as frequently as needed. There is no evidence of any significant recent nosebleed.  The "pink stuff" that you coughed up is likely just postnasal drainage (the same "pink stuff" that you blew out of your nose). There is no evidence of any infection or other problem.  Please try and find at least 30 minutes daily (if not more) to engage in an activity to keep your mind distracted and busy, preferably finding something you enjoy (ie puppy videos, "uplift" videos. Consider volunteering.  I briefly mentioned Freight [employer] or Out of the [employer] vs through [employer] or elsewhere. Continue with your counseling.

## 2018-09-16 NOTE — Progress Notes (Signed)
COMMUNITY PALLIATIVE CARE SW NOTE  PATIENT NAME: Tracy Malone DOB: 06-10-1932 MRN: [id number]  PRIMARY CARE PROVIDER: [name], MD  RESPONSIBLE PARTY:  Acct ID - Guarantor Home Phone Work Phone Relationship Acct Type  [id number] [name] Self P/F     [address], [place], [place] [address]     PLAN OF CARE and INTERVENTIONS:             1. GOALS OF CARE/ ADVANCE CARE PLANNING:  Patient wishes to remain at home with her husband, [name].  She does not wish to be hospitalized.  She remains a full code. 2. SOCIAL/EMOTIONAL/SPIRITUAL ASSESSMENT/ INTERVENTIONS:  SW and Palliative Care RN, [name], met with patient and her husband in their home.  She complained of having a few cuts on her feet.  Since the last visit, the couple flew to [facility] to visit their son.  SW provided active listening and supportive counseling.  Patient became tearful when discussing him.  Her husband remains supportive and says he tries to take her mind off of sad thoughts. 3. PATIENT/CAREGIVER EDUCATION/ COPING:  Patient expresses her feelings openly. 4. PERSONAL EMERGENCY PLAN:  She informs her husband of any medical needs. 5. COMMUNITY RESOURCES COORDINATION/ HEALTH CARE NAVIGATION:  A housekeeper cleans their home one time a month. 6. FINANCIAL/LEGAL CONCERNS/INTERVENTIONS:  None.     SOCIAL HX:  Social History   Tobacco Use  . Smoking status: Never Smoker  . Smokeless tobacco: Never Used  Substance Use Topics  . Alcohol use: No    Alcohol/week: 0.0 standard drinks    CODE STATUS:  Full code. ADVANCED DIRECTIVES: N MOST FORM COMPLETE:  N HOSPICE EDUCATION PROVIDED:  N PPS:  Patient states her appetite is normal.  She stands and ambulates independently. Duration of visit and documentation:  45 minutes.      Creola Corn Eulice Rutledge, LCSW

## 2018-09-17 ENCOUNTER — Ambulatory Visit (INDEPENDENT_AMBULATORY_CARE_PROVIDER_SITE_OTHER): Payer: Medicare Other | Admitting: Psychiatry

## 2018-09-17 DIAGNOSIS — F411 Generalized anxiety disorder: Secondary | ICD-10-CM

## 2018-09-18 NOTE — Progress Notes (Signed)
COMMUNITY PALLIATIVE CARE RN NOTE  PATIENT NAME: Tracy Malone DOB: [birth year] MRN: [id number]  PRIMARY CARE PROVIDER: [name], MD  RESPONSIBLE PARTY: [name] (husband) Acct ID - Guarantor Home Phone Work Phone Relationship Acct Type  [id number] [name] Self P/F     [address], [place], [place] [address]    PLAN OF CARE and INTERVENTION:  1. ADVANCE CARE PLANNING/GOALS OF CARE: Goal is to remain at home with her husband. She is a Full code. 2. PATIENT/CAREGIVER : Reinforced Safe Mobility 3. DISEASE STATUS: Joint visit made with Palliative Care SW, Lynn Duffy. Met with patient and husband in their home. She denies pain at this time, but does experience some soreness in her back. She reports going to the Podiatrist yesterday to get calluses removed, but unfortunately sustained cuts on both of her feet. They are currently covered with a band-aid. She continues with tearful episodes, which occurred some during the visit today. She says crying helps her to feel better. She recently took a flight to visit her son in [place]. Tremor continues in R hand from Parkinson's. She wears a wrist brace which helps. She continues to take Safinamide, prescribed by Dr. [name] for depression. She feels that her writing has improved. She has some difficulty sleeping during the night. She took 1/4 of her Seroquel last pm and slept well. Recommended that she take this regularly at night. Her mood seems better the following day after getting adequate sleep. Her intake is normal. She remains independent with all ADLs. Will continue to monitor.  HISTORY OF PRESENT ILLNESS:  This is a 85 yo female who resides at home with her husband. Palliative Care Team continues to follow patient. Will continue to visit monthly and PRN.  CODE STATUS: Full Code  ADVANCED DIRECTIVES: Y MOST FORM: no PPS: 50%   PHYSICAL EXAM:   LUNGS: clear to auscultation  CARDIAC: Cor RRR EXTREMITIES:  No edema SKIN: No skin issues  NEURO: Alert and oriented x 3, tearful episodes at times, ambulatory   (Duration of visit and documentation 60 minutes)    [name], RN, BSN

## 2018-09-20 ENCOUNTER — Encounter: Payer: Self-pay | Admitting: Podiatry

## 2018-09-20 NOTE — Progress Notes (Addendum)
Subjective: Tracy Malone presents with cc of painful, discolored, thick toenails and painful mycotic toenails and calluses which interfere with activities of daily living. Pain is aggravated when wearing enclosed shoe gear. Pain is relieved with periodic professional debridement.  Tracy Ohara, MD is her PCP and last visit was 08/29/2018.   Current Outpatient Medications:  .  acetaminophen (TYLENOL) 500 MG tablet, Take 500 mg by mouth every 6 (six) hours as needed., Disp: , Rfl:  .  ALPRAZolam (XANAX) 0.5 MG tablet, TAKE BY MOUTH UP TO 3 TIMES DAILY. WARNING: BENZODIAZEPINES INCREASE THE RISK OF FALLS AND NEUROCOGNITIVE DECLINE IN THE ELDERLY, Disp: 90 tablet, Rfl: 2 .  b complex vitamins capsule, Take 1 capsule by mouth as needed. , Disp: , Rfl:  .  Carbidopa-Levodopa ER (SINEMET CR) 25-100 MG tablet controlled release, TAKE ONE TABLET BY MOUTH THREE TIMES A DAY, Disp: 270 tablet, Rfl: 0 .  Cholecalciferol (VITAMIN D) 2000 UNITS tablet, Take 2,000 Units by mouth daily., Disp: , Rfl:  .  loratadine (CLARITIN) 10 MG tablet, Take 10 mg by mouth daily., Disp: , Rfl:  .  Probiotic Product (ALIGN PO), Take 1 capsule by mouth every other day. , Disp: , Rfl:  .  QUEtiapine (SEROQUEL) 25 MG tablet, Take 25 mg by mouth at bedtime., Disp: , Rfl:  .  selegiline (ELDEPRYL) 5 MG capsule, Take 5 mg by mouth daily., Disp: , Rfl:  .  SYNTHROID 25 MCG tablet, Take 1 tablet (25 mcg total) by mouth daily before breakfast., Disp: 90 tablet, Rfl: 1 .  vitamin B-12 (CYANOCOBALAMIN) 1000 MCG tablet, Take 1,000 mcg by mouth daily., Disp: , Rfl:  .  vitamin E 400 UNIT capsule, Take 400 Units by mouth every Monday, Wednesday, and Friday. Reported on 09/06/2015, Disp: , Rfl:   Allergies  Allergen Reactions  . Iodine Anaphylaxis    IV and topical forms.  Burnard Leigh [Hyoscyamine Sulfate]     Vision problems/pt has glaucoma  . Salmon [Fish Allergy] Hives and Shortness Of Breath  . Shellfish Allergy Anaphylaxis  .  Remeron [Mirtazapine] Other (See Comments)    Cause blurred vision and red eyes, pt has glaucoma  . Aspirin Other (See Comments)    Sever stomach pain due to ulcer scaring.  . Ciprofloxacin Diarrhea  . Codeine Nausea And Vomiting  . Darvocet [Propoxyphene N-Acetaminophen] Nausea And Vomiting  . Demerol [Meperidine] Nausea Only  . Dexilant [Dexlansoprazole] Swelling    Redness, swelling and peeling of both feet.  . Diphedryl [Diphenhydramine] Other (See Comments)    Increased pulse/small amount ok  . Doxycycline Hyclate Other (See Comments)    GI intolerance.  Marland Kitchen Epinephrine Other (See Comments)    Breathing problems  . Erythromycin Other (See Comments)    GI intolerance.  Yvette Rack [Cyclobenzaprine] Other (See Comments)    Tingly/prickly sensation.  Marland Kitchen Keflex [Cephalexin] Hives  . Latex Other (See Comments)    Gloves ok.  Skin gets red from elastic in underwear and latex bandaides.  . Prednisone Other (See Comments)    Headache  . Pylera [Bis Subcit-Metronid-Tetracyc] Swelling    Tongue swelling. Face tingling  . Sulfa Antibiotics Other (See Comments)    Increased pulse, fainting, diarrhea, thrush  . Xylocaine [Lidocaine Hcl]     With epinephrine, given by dentist.  Speeded up heart rate and she passed out (occured twice, at dentist)  . Zoloft [Sertraline Hcl] Swelling and Other (See Comments)    Migraine Swelling of tongue/lip (09/2012)  .  Advil [Ibuprofen] Other (See Comments)    Motrin ok with a GI effect.  . Clarithromycin Rash    Started after completing 10 day course of 2000 mg /day    Vascular Examination: Capillary refill time immediate  x 10 digits  Dorsalis pedis and Posterior tibial pulses present b/l  Digital hair present x 10 digits  Skin temperature gradient WNL b/l  Dermatological Examination: Skin with normal turgor, texture and tone b/l.  Toenails 1-5 b/l discolored, thick, dystrophic with subungual debris and pain with palpation to nailbeds due to  thickness of nails.  Hyperkeratotic lesion submetatarsal head 5 b/l and submet head 1 left foot. No erythema, no edema, no drainage, no flocculence.  Musculoskeletal: Muscle strength 5/5 to all LE muscle groups  Neurological: Sensation intact with 10 gram monofilamen  Vibratory sensation intact   Assessment: 1. Painful onychomycosis toenails 1-5 b/l  2. Calluses submetatarsal head 5 b/l and submet head 1 left foot  Plan: 1. Toenails 1-5 b/l were debrided in length and girth without iatrogenic bleeding. 2. Hyperkeratotic lesion pared with sterile scalpel submetatarsal head 5 b/l and submet head 1 left foot without incident. 3. Patient to continue soft, supportive shoe gear daily. 4. Patient to report any pedal injuries to medical professional immediately. 5. Follow up 3 months.  6. Patient/POA to call should there be a concern in the interim.

## 2018-09-25 ENCOUNTER — Telehealth: Payer: Self-pay | Admitting: Neurology

## 2018-09-25 DIAGNOSIS — F332 Major depressive disorder, recurrent severe without psychotic features: Secondary | ICD-10-CM | POA: Diagnosis not present

## 2018-09-25 NOTE — Telephone Encounter (Signed)
Patient called to ask about taking Levodopa with food. I explained the need to keep away from protein, but what food she could eat with medication. She will call with any other questions.

## 2018-09-29 ENCOUNTER — Encounter: Payer: Self-pay | Admitting: Family Medicine

## 2018-09-30 ENCOUNTER — Other Ambulatory Visit: Payer: Self-pay | Admitting: Family Medicine

## 2018-09-30 ENCOUNTER — Encounter: Payer: Self-pay | Admitting: Family Medicine

## 2018-09-30 ENCOUNTER — Other Ambulatory Visit: Payer: Medicare Other

## 2018-09-30 DIAGNOSIS — D72829 Elevated white blood cell count, unspecified: Secondary | ICD-10-CM | POA: Diagnosis not present

## 2018-09-30 DIAGNOSIS — E039 Hypothyroidism, unspecified: Secondary | ICD-10-CM

## 2018-09-30 LAB — CBC WITH DIFFERENTIAL/PLATELET
Basophils Absolute: 0.1 10*3/uL (ref 0.0–0.2)
Basos: 1 %
EOS (ABSOLUTE): 0.2 10*3/uL (ref 0.0–0.4)
EOS: 1 %
Hematocrit: 47 % — ABNORMAL HIGH (ref 34.0–46.6)
Hemoglobin: 15.5 g/dL (ref 11.1–15.9)
Immature Grans (Abs): 0.2 10*3/uL — ABNORMAL HIGH (ref 0.0–0.1)
Immature Granulocytes: 1 %
Lymphocytes Absolute: 2.6 10*3/uL (ref 0.7–3.1)
Lymphs: 19 %
MCH: 30.2 pg (ref 26.6–33.0)
MCHC: 33 g/dL (ref 31.5–35.7)
MCV: 92 fL (ref 79–97)
Monocytes Absolute: 1.1 10*3/uL — ABNORMAL HIGH (ref 0.1–0.9)
Monocytes: 8 %
NEUTROS ABS: 9.5 10*3/uL — AB (ref 1.4–7.0)
Neutrophils: 70 %
Platelets: 253 10*3/uL (ref 150–450)
RBC: 5.13 x10E6/uL (ref 3.77–5.28)
RDW: 14 % (ref 11.7–15.4)
WBC: 13.6 10*3/uL — ABNORMAL HIGH (ref 3.4–10.8)

## 2018-10-01 ENCOUNTER — Encounter: Payer: Self-pay | Admitting: Family Medicine

## 2018-10-01 ENCOUNTER — Ambulatory Visit (INDEPENDENT_AMBULATORY_CARE_PROVIDER_SITE_OTHER): Payer: Medicare Other | Admitting: Psychiatry

## 2018-10-01 ENCOUNTER — Other Ambulatory Visit: Payer: Self-pay | Admitting: Family Medicine

## 2018-10-01 ENCOUNTER — Ambulatory Visit (INDEPENDENT_AMBULATORY_CARE_PROVIDER_SITE_OTHER): Payer: Medicare Other | Admitting: Family Medicine

## 2018-10-01 VITALS — BP 120/82 | HR 75 | Temp 97.8°F | Wt 109.6 lb

## 2018-10-01 DIAGNOSIS — N3001 Acute cystitis with hematuria: Secondary | ICD-10-CM | POA: Diagnosis not present

## 2018-10-01 DIAGNOSIS — I6521 Occlusion and stenosis of right carotid artery: Secondary | ICD-10-CM | POA: Diagnosis not present

## 2018-10-01 DIAGNOSIS — D729 Disorder of white blood cells, unspecified: Secondary | ICD-10-CM

## 2018-10-01 DIAGNOSIS — F411 Generalized anxiety disorder: Secondary | ICD-10-CM | POA: Diagnosis not present

## 2018-10-01 DIAGNOSIS — R61 Generalized hyperhidrosis: Secondary | ICD-10-CM

## 2018-10-01 LAB — POCT URINALYSIS DIP (PROADVANTAGE DEVICE)
Bilirubin, UA: NEGATIVE
GLUCOSE UA: NEGATIVE mg/dL
Ketones, POC UA: NEGATIVE mg/dL
Nitrite, UA: POSITIVE — AB
Protein Ur, POC: NEGATIVE mg/dL
Specific Gravity, Urine: 1.015
Urobilinogen, Ur: 3.5
pH, UA: 6 (ref 5.0–8.0)

## 2018-10-01 MED ORDER — NITROFURANTOIN MONOHYD MACRO 100 MG PO CAPS: 100.0000 mg | ORAL_CAPSULE | Freq: Two times a day (BID) | ORAL | 0 refills | Status: DC

## 2018-10-01 NOTE — Progress Notes (Signed)
   Subjective:    Patient ID: Tracy Malone, female    DOB: 1931/09/26, 85 y.o.   MRN: [id number]  HPI She complains of a 4-day history of backache, lower abdominal pain, cold sweats, fatigue with frequency.  No fever or chills. Review of the records states that she does have multiple allergies and side effects from medications. Review of Systems     Objective:   Physical Exam Alert and in no distress.  Urine dipstick did show nitrite positive.       Assessment & Plan:  Acute cystitis with hematuria - Plan: nitrofurantoin, macrocrystal-monohydrate, (MACROBID) 100 MG capsule Discussed the use of Macrobid and possible toxicity with her however she would like this medicine as it has worked well for her in the past.

## 2018-10-01 NOTE — Progress Notes (Signed)
Referral to hematology

## 2018-10-02 ENCOUNTER — Telehealth: Payer: Self-pay | Admitting: Family Medicine

## 2018-10-02 NOTE — Telephone Encounter (Signed)
Requested records received from [facility]. Sending back for review.

## 2018-10-04 ENCOUNTER — Telehealth: Payer: Self-pay

## 2018-10-04 ENCOUNTER — Ambulatory Visit: Payer: Medicare Other | Admitting: Podiatry

## 2018-10-04 MED ORDER — CIPROFLOXACIN HCL 250 MG PO TABS: 250.0000 mg | ORAL_TABLET | Freq: Two times a day (BID) | ORAL | 0 refills | Status: DC

## 2018-10-04 NOTE — Telephone Encounter (Signed)
Correction: I have prescribed Cipro low dose in the past for her for UTI. We can try this again. She does not need to come in. Ciprofloxacin 250 mg twice daily x 7 days, no refills. Please send this in for her. Thanks. If she is not improving by Monday then she should be seen again and will need a urine culture at that time.

## 2018-10-04 NOTE — Telephone Encounter (Signed)
Please re view previous message as patient was seen recently fir UTI and she does not feel any better. She would like a different medication ( to be Cipro) to be sent to [facility].

## 2018-10-04 NOTE — Telephone Encounter (Signed)
Medication has been sent and patient has been informed.

## 2018-10-04 NOTE — Telephone Encounter (Signed)
Patient called and stated she does not feel any better after taking Macrobid. Stated her bladder still feels full and she may need Cipro. Please advise.

## 2018-10-04 NOTE — Telephone Encounter (Signed)
I have never seen her before but after reviewing her chart it appears that she has an allergy to Cipro.  Since she is not improving, I recommend she drop off another urine specimen today and let send it for a urine culture to see which antibiotics will take care of the infection.

## 2018-10-07 ENCOUNTER — Encounter: Payer: Self-pay | Admitting: Family Medicine

## 2018-10-07 NOTE — Addendum Note (Signed)
Addended by: Elyse Jarvis on: 10/07/2018 08:58 AM   Modules accepted: Orders

## 2018-10-08 ENCOUNTER — Telehealth: Payer: Self-pay | Admitting: Licensed Clinical Social Worker

## 2018-10-08 NOTE — Telephone Encounter (Signed)
Palliative Care SW spoke with patient and scheduled a home visit for Friday, 3/20, at 1pm.

## 2018-10-11 ENCOUNTER — Other Ambulatory Visit: Payer: Self-pay

## 2018-10-11 ENCOUNTER — Other Ambulatory Visit: Payer: Medicare Other | Admitting: *Deleted

## 2018-10-11 ENCOUNTER — Other Ambulatory Visit: Payer: Medicare Other | Admitting: Licensed Clinical Social Worker

## 2018-10-11 DIAGNOSIS — Z515 Encounter for palliative care: Secondary | ICD-10-CM

## 2018-10-11 NOTE — Progress Notes (Signed)
COMMUNITY PALLIATIVE CARE SW NOTE  PATIENT NAME: Tracy Malone DOB: 12/14/1931 MRN: [id number]  PRIMARY CARE PROVIDER: [name], MD  RESPONSIBLE PARTY:  Acct ID - Guarantor Home Phone Work Phone Relationship Acct Type  [id number] [name] Self P/F     [address], [place], [place] [address]     PLAN OF CARE and INTERVENTIONS:             1. GOALS OF CARE/ ADVANCE CARE PLANNING:  Goal is for patient to remain in her home with her husband, Tracy Malone, who is also a Palliative Care patient.  She wishes to avoid hospitalizations.  She is a full code. 2. SOCIAL/EMOTIONAL/SPIRITUAL ASSESSMENT/ INTERVENTIONS:  SW and Palliative Care RN, [name], met with patient and her husband in their home.  Patient was in her robe.  She cried during most of the visit about news she recently received about her son.  SW attempted to provide interventions and supportive counseling.  Patient has a history of depression.  Both she and her husband understand the signs and symptoms.  3. PATIENT/CAREGIVER EDUCATION/ COPING:  SW provided education regarding adjusting to the new about her son.  She is currently coping by expressing her feelings.  She is remaining in bed more.  She said her stomach is "upset" from antibiotics she took recently. 4. PERSONAL EMERGENCY PLAN:  She will inform her family of medical concerns and contact a MD if necessary. 5. COMMUNITY RESOURCES COORDINATION/ HEALTH CARE NAVIGATION:  She has a Secretary/administrator. 6. FINANCIAL/LEGAL CONCERNS/INTERVENTIONS:  None.     SOCIAL HX:  Social History   Tobacco Use  . Smoking status: Never Smoker  . Smokeless tobacco: Never Used  Substance Use Topics  . Alcohol use: No    Alcohol/week: 0.0 standard drinks    CODE STATUS:  Full Code ADVANCED DIRECTIVES: N MOST FORM COMPLETE:  N HOSPICE EDUCATION PROVIDED: N PPS:  Patient stated her appetite has decreased since the news about her son and taking antibiotics.  She  can ambulate independently. Duration of visit and documentation:  60 minutes.      [name], LCSW

## 2018-10-12 ENCOUNTER — Encounter: Payer: Self-pay | Admitting: Family Medicine

## 2018-10-14 ENCOUNTER — Telehealth: Payer: Self-pay | Admitting: Neurology

## 2018-10-14 ENCOUNTER — Telehealth (INDEPENDENT_AMBULATORY_CARE_PROVIDER_SITE_OTHER): Payer: Medicare Other | Admitting: Family Medicine

## 2018-10-14 ENCOUNTER — Other Ambulatory Visit: Payer: Self-pay

## 2018-10-14 ENCOUNTER — Ambulatory Visit: Payer: Medicare Other | Admitting: Family Medicine

## 2018-10-14 DIAGNOSIS — F419 Anxiety disorder, unspecified: Secondary | ICD-10-CM | POA: Diagnosis not present

## 2018-10-14 DIAGNOSIS — R1084 Generalized abdominal pain: Secondary | ICD-10-CM | POA: Diagnosis not present

## 2018-10-14 DIAGNOSIS — R61 Generalized hyperhidrosis: Secondary | ICD-10-CM

## 2018-10-14 DIAGNOSIS — F32A Depression, unspecified: Secondary | ICD-10-CM

## 2018-10-14 DIAGNOSIS — F329 Major depressive disorder, single episode, unspecified: Secondary | ICD-10-CM

## 2018-10-14 NOTE — Progress Notes (Signed)
Consents to phone consultation Nobody else in the room with her at home  Start time 3:49 End time 4:21 Visit time 32 minutes  Patient is seeking consult to follow-up on many various concerns as expressed her in her multiple emails over the last week--regarding her depression, anxiety, GI complaints, clamminess in the mornings, elevated white count.  Today she reports that her stomach is feeling a little better. "the two antibiotics really screwed me up", but is finally improving. She had been using miralax prior to taking the antibiotics, stopped when taking them, and resumed taking it yesterday.  She reports she hadn't been moving her bowels as regularly, while having her increased GI complaints, but used Miralax yesterday and her stomach feels better today. She describes that if she eats too much for dinner, it feels "like a boulder in my abdomen, I can't even sleep, I feel like I can't digest things".  She continues to wake up 1-5am feeling clammy.  Never gets this during the day. No change in thermostat. This started about 6 weeks ago. She denies any worsening symptoms, and actually thinks it might have been a little better these last couple of days.  She reports she only had a slight bit this morning around 5, and didn't have it the night before.  So maybe is getting slightly better (?)  No longer getting up to void frequently at night (used to wake up 3-4 times/night, partly related to UTI, and prior to that was related to drinking so much water). She cut back and isn't drinking as much water (which may have contributed to her GI complaints).  She checks her temperature regularly, before bed and also when feeling clammy.  Hasn't had any fever.  She reports that her therapists canceled all of her upcoming appointments.  She was not aware that anyone was doing telephone or video visits. She hasn't tried calling them since her appointments were canceled.  She has been in email contact with  her psychiatrist.  'I want to go back on the selegiline, I felt so much better on that stuff"  Stopped related to blurry vision, vision changes. This was stopped 2.5 weeks ago.  She states she thinks the psychiatrist thinks maybe that could also contribute to the clamminess (though those symptoms are only slightly better in the last 2 days, not within a closer time frame of stopping the medication). She felt her rigidity, tremor and anxiety was better when she was on that medication.  Apparently there is a lower dose, but reportedly it was much more expensive. She just sent him email about whether or not she could restart this.  A significant source of her anxiety has been her son. He did go to court, and she reports that he got a 5 year sentence. She states this is not a "hard core" prison. She has some hope that it can be shorter, still dealing with lawyers.  She reports she will be able to have regular contact with him, and did a "hangout" session with him last night and really enjoyed that, made her happy.  No changes in meds except as noted above (selegiline stopped) Meds/allergies reviewed.  PHYSICAL EXAM: Not performed, phone consult. She has normal speech.  Full range of affect.  Sounds somewhat down and anxious, but laughing and asking about my family.  ASSESSMENT/PLAN:  Generalized abdominal pain - worse after dinner; improved since resuming miralax. Cont increased water, miralax prn  Night sweats - early morning clamminess. Less the  last couple of mornings.   Anxiety and depression - to call therapist office and see if can do phone/video visit. Counseled today (staying busy, positive, not feeling socially isolated)   32 minute phone consultation, significant counseling performed

## 2018-10-14 NOTE — Telephone Encounter (Signed)
Spoke with patient and verified that they do have a video compatible device. Best number to reach them is [phone]. Email: [email or web address].

## 2018-10-14 NOTE — Patient Instructions (Signed)
   Please call your therapists' offices to verify whether or not you can be "seen"--whether it be a phone consult or video consult.  Please be sure to drink plenty of water--try and drink the majority of it prior to 4-5pm (so it doesn't keep you up at night going to the bathroom)--drinking the fluid will help you keep your bowels regular and prevent bloating and constipation. Use Miralax just as needed to help keep your bowels moving regularly.  Please stay in regular contact with your family--by phone, video--to prevent feeling isolated.  Do things daily that bring you some joy. We spoke about trying to see some of the [employer] tours, concerts, etc.   I hope that your clamminess continues to improve. As we discussed, no urgency with with hematology consult.  Stay home, stay well, and stay positive!!

## 2018-10-15 ENCOUNTER — Telehealth: Payer: Self-pay | Admitting: Neurology

## 2018-10-15 NOTE — Progress Notes (Deleted)
Virtual Visit via Video Note The purpose of this virtual visit is to provide medical care while limiting exposure to the novel coronavirus.    Consent was obtained for video visit:  Yes.   Answered questions that patient had about telehealth interaction:  Yes.   I discussed the limitations, risks, security and privacy concerns of performing an evaluation and management service by telemedicine. I also discussed with the patient that there may be a patient responsible charge related to this service. The patient expressed understanding and agreed to proceed.  Pt location: Home Physician Location: office Name of referring provider:  [name], MD I connected with Tracy Malone at patients initiation/request on 10/16/2018 at 11:00 AM EDT by video enabled telemedicine application and verified that I am speaking with the correct person using two identifiers. Pt MRN:  [id number] Pt DOB:  September 23, 1931   History of Present Illness: Patient is seen via tele-visit for Parkinson's disease.  She is on carbidopa/levodopa 25/100, 1 tablet 3 times per day.  There is no falls since last visit.  No lightheadedness or near syncope.  Anxiety/depression continues to be the biggest issue.  She just had a phone visit with her primary care provider regarding this and I reviewed that in detail.  She has emailed me several times since last visit.  She emailed me about her psychiatry starting her on low-dose quetiapine, which I had no objection to.  She emailed me about CBD, which I did not really recommend for Parkinson's disease, due to insufficient data.  She emailed me regarding urinary frequency and thought it was related to levodopa.  I explained that this was not related to levodopa, but could be related to Parkinson's disease.  I offered a referral to urogynecology, which she wanted.  We sent a referral to Dr. Zigmund Daniel.  She has an appointment on May 13.  Has still been seeing psychiatry.  She is off of selegiline, but  states that she wants to go back on it (although stopped it because of complaints about blurry vision).    Observations/Objective:  *** GEN:  The patient appears stated age and is in NAD.  Neurological examination:  Orientation: The patient is alert and oriented x3. Cranial nerves: There is good facial symmetry. There is ***facial hypomimia.  The speech is fluent and clear. Soft palate rises symmetrically and there is no tongue deviation. Hearing is intact to conversational tone. Motor: Strength is at least antigravity x 4.   Shoulder shrug is equal and symmetric.  There is no pronator drift.  Movement examination: Tone: unable Abnormal movements: ***There is a near constant right upper extremity resting tremor that increases with distraction. Coordination:  There is no decremation with RAM's, *** Gait and Station: The patient has no difficulty arising out of a deep-seated chair without the use of the hands. The patient's stride length is ***.      Assessment and Plan:  *** 1.  Idiopathic Parkinson's disease.  The patient has tremor, bradykinesia, rigidity and mild postural instability.             -On carbidopa/levodopa 25/100, 1 tablet 3 times per day.  -Biggest barrier to treatment is actually the patient's anxiety/depression and multiple intolerances to medication (that are really likely related more to anxiety and depression than true intolerances).             -Encouraged exercise.  She does not wish to participate in any community Parkinson's programs (currently not going on because  of covid)  -Discussed in detail Covid-19 and risk factors for this, including age and PD.  Discussed importance of social distancing.  Discussed importance of staying home at all times, as is feasible.  Discussed taking advantage of grocery store hours for the elderly.  Pt expressed understanding.               2.  PBA              -It certainly is possible that the patient has pseudobulbar affect in  addition to depression.  3.  Depression/anxiety             -Patient is following with psychiatry.  This continues to be the biggest issue.   Follow Up Instructions:    -I discussed the assessment and treatment plan with the patient. The patient was provided an opportunity to ask questions and all were answered. The patient agreed with the plan and demonstrated an understanding of the instructions.   The patient was advised to call back or seek an in-person evaluation if the symptoms worsen or if the condition fails to improve as anticipated.    Total Time spent in visit with the patient was:  ***, of which more than 50% of the time was spent in counseling and/or coordinating care on ***.   Pt understands and agrees with the plan of care outlined.     Alonza Bogus, DO

## 2018-10-15 NOTE — Telephone Encounter (Signed)
Left message on machine for patient to call back.  To go over medications prior to e-visit tomorrow and ask about urogyn. Ref.

## 2018-10-15 NOTE — Progress Notes (Signed)
COMMUNITY PALLIATIVE CARE RN NOTE  PATIENT NAME: Tracy Malone DOB: Feb 02, 1932 MRN: [id number]  PRIMARY CARE PROVIDER: [name], MD  RESPONSIBLE PARTY:  Acct ID - Guarantor Home Phone Work Phone Relationship Acct Type  [id number] [name] Self P/F     [address], [place], [place] [address]    PLAN OF CARE and INTERVENTION:  1. ADVANCE CARE PLANNING/GOALS OF CARE: She wants to remain in her home. She is a Full Code. 2. PATIENT/CAREGIVER EDUCATION: Reinforced Safe Mobility and Relaxation Techniques/Redirection 3. DISEASE STATUS: Joint visit made with Palliative Care SW, Lynn Duffy. Met with patient and husband in their home. Patient came out of her room still wearing her pajamas and house coat. She denies pain at this time, but is very sad and tearful throughout majority of visit. She received bad news regarding her son in [place] and is having difficulty coping. She reports that she is also not sleeping well. She continues on Xanax for anxiety. Her intake has somewhat deteriorated, but she states that she does try and eat something 3 times/day. She c/o stomach discomfort and mild nausea. She recently was placed on antibiotics for a UTI, but states they upset her stomach and caused diarrhea. Macrobid was tried initially, then was switched to Cipro, but both caused same effect. She continues with L hand tremor and continues to wear a wrist brace, which helps. She remains ambulatory and able to perform ADLs independently. She is disinterested in any type of activity. She used to crochet scarfs, but does not want to do this either. Her appointments with her Pyschiatrist and Counselor were cancelled d/t COVID-19 restrictions. Provided active listening and support. Will continue to monitor.     HISTORY OF PRESENT ILLNESS:  This is a 85 yo female who resides at home with her husband. Palliative Care Team continues to follow patient. Will continue to visit patient monthly  and PRN.  CODE STATUS: Full Code  ADVANCED DIRECTIVES: N MOST FORM: no PPS: 30%   PHYSICAL EXAM:   LUNGS: clear to auscultation  CARDIAC: Cor RRR EXTREMITIES: No edema SKIN: Exposed skin is dry and intact  NEURO: Alert and oriented x 3, depressed mood, tearful, ambulatory without assistive devices   (Duration of visit and documentation 60 minutes)    [name], RN BSN

## 2018-10-15 NOTE — Telephone Encounter (Signed)
Patient called back and I verified her medications. She states she feels like this is too overwhelming and wants to wait to do this visit. Chelsea - please call to reschedule. I did offer to reschedule in the office but doesn't want to wait that long.  Dr. Carles Collet - fyi.

## 2018-10-15 NOTE — Telephone Encounter (Signed)
Also, asked patient about appt with Urogyn and this was r/s to the middle of May. FYI.

## 2018-10-15 NOTE — Telephone Encounter (Signed)
-----   Message from Sterling, DO sent at 10/15/2018 10:53 AM EDT ----- Find out what happened to urogyn referral.  Doesn't look like she had appt

## 2018-10-16 ENCOUNTER — Telehealth: Payer: Self-pay | Admitting: Neurology

## 2018-10-17 ENCOUNTER — Ambulatory Visit: Payer: Medicare Other | Admitting: Psychiatry

## 2018-10-20 ENCOUNTER — Encounter: Payer: Self-pay | Admitting: Family Medicine

## 2018-10-22 ENCOUNTER — Other Ambulatory Visit: Payer: Self-pay

## 2018-10-22 ENCOUNTER — Encounter: Payer: Self-pay | Admitting: Internal Medicine

## 2018-10-22 ENCOUNTER — Inpatient Hospital Stay: Payer: Medicare Other

## 2018-10-22 ENCOUNTER — Inpatient Hospital Stay: Payer: Medicare Other | Attending: Internal Medicine | Admitting: Internal Medicine

## 2018-10-22 VITALS — BP 130/60 | HR 80 | Temp 98.0°F | Resp 18 | Ht 59.5 in | Wt 110.8 lb

## 2018-10-22 DIAGNOSIS — M2550 Pain in unspecified joint: Secondary | ICD-10-CM

## 2018-10-22 DIAGNOSIS — R300 Dysuria: Secondary | ICD-10-CM

## 2018-10-22 DIAGNOSIS — D72828 Other elevated white blood cell count: Secondary | ICD-10-CM

## 2018-10-22 DIAGNOSIS — G20 Parkinson's disease: Secondary | ICD-10-CM | POA: Diagnosis not present

## 2018-10-22 DIAGNOSIS — D508 Other iron deficiency anemias: Secondary | ICD-10-CM

## 2018-10-22 DIAGNOSIS — D509 Iron deficiency anemia, unspecified: Secondary | ICD-10-CM

## 2018-10-22 DIAGNOSIS — D72829 Elevated white blood cell count, unspecified: Secondary | ICD-10-CM | POA: Diagnosis not present

## 2018-10-22 DIAGNOSIS — Z87440 Personal history of urinary (tract) infections: Secondary | ICD-10-CM

## 2018-10-22 DIAGNOSIS — D72822 Plasmacytosis: Secondary | ICD-10-CM | POA: Diagnosis not present

## 2018-10-22 DIAGNOSIS — Z114 Encounter for screening for human immunodeficiency virus [HIV]: Secondary | ICD-10-CM

## 2018-10-22 LAB — CMP (CANCER CENTER ONLY)
ALBUMIN: 3.5 g/dL (ref 3.5–5.0)
ALT: 7 U/L (ref 0–44)
AST: 17 U/L (ref 15–41)
Alkaline Phosphatase: 74 U/L (ref 38–126)
Anion gap: 11 (ref 5–15)
BUN: 13 mg/dL (ref 8–23)
CHLORIDE: 103 mmol/L (ref 98–111)
CO2: 25 mmol/L (ref 22–32)
Calcium: 9.2 mg/dL (ref 8.9–10.3)
Creatinine: 0.75 mg/dL (ref 0.44–1.00)
GFR, Est AFR Am: >60 mL/min (ref >60)
GFR, Estimated: >60 mL/min (ref >60)
Glucose, Bld: 96 mg/dL (ref 70–99)
Potassium: 3.5 mmol/L (ref 3.5–5.1)
Sodium: 139 mmol/L (ref 135–145)
Total Bilirubin: 0.8 mg/dL (ref 0.3–1.2)
Total Protein: 7.4 g/dL (ref 6.5–8.1)

## 2018-10-22 LAB — URINALYSIS, COMPLETE (UACMP) WITH MICROSCOPIC
Bacteria, UA: NONE SEEN
Bilirubin Urine: NEGATIVE
Glucose, UA: NEGATIVE mg/dL
Hgb urine dipstick: NEGATIVE
Ketones, ur: NEGATIVE mg/dL
Leukocytes,Ua: NEGATIVE
Nitrite: NEGATIVE
PH: 5 (ref 5.0–8.0)
Protein, ur: NEGATIVE mg/dL
SPECIFIC GRAVITY, URINE: 1.01 (ref 1.005–1.030)

## 2018-10-22 LAB — CBC WITH DIFFERENTIAL (CANCER CENTER ONLY)
Abs Immature Granulocytes: 0.14 10*3/uL — ABNORMAL HIGH (ref 0.00–0.07)
Basophils Absolute: 0.1 10*3/uL (ref 0.0–0.1)
Basophils Relative: 1 %
Eosinophils Absolute: 0.1 10*3/uL (ref 0.0–0.5)
Eosinophils Relative: 1 %
HEMATOCRIT: 46.4 % — AB (ref 36.0–46.0)
HEMOGLOBIN: 14.4 g/dL (ref 12.0–15.0)
Immature Granulocytes: 1 %
LYMPHS ABS: 2.3 10*3/uL (ref 0.7–4.0)
LYMPHS PCT: 20 %
MCH: 29.9 pg (ref 26.0–34.0)
MCHC: 31 g/dL (ref 30.0–36.0)
MCV: 96.5 fL (ref 80.0–100.0)
Monocytes Absolute: 1 10*3/uL (ref 0.1–1.0)
Monocytes Relative: 8 %
Neutro Abs: 8.3 10*3/uL — ABNORMAL HIGH (ref 1.7–7.7)
Neutrophils Relative %: 69 %
Platelet Count: 260 10*3/uL (ref 150–400)
RBC: 4.81 MIL/uL (ref 3.87–5.11)
RDW: 14.2 % (ref 11.5–15.5)
WBC Count: 12 10*3/uL — ABNORMAL HIGH (ref 4.0–10.5)
nRBC: 0 % (ref 0.0–0.2)

## 2018-10-22 LAB — SEDIMENTATION RATE: Sed Rate: 4 mm/hr (ref 0–22)

## 2018-10-22 LAB — LACTATE DEHYDROGENASE: LDH: 197 U/L — ABNORMAL HIGH (ref 98–192)

## 2018-10-22 NOTE — Progress Notes (Signed)
Virtual Visit via Video Note The purpose of this virtual visit is to provide medical care while limiting exposure to the novel coronavirus.    Consent was obtained for video visit:  Yes.   Answered questions that patient had about telehealth interaction:  Yes.   I discussed the limitations, risks, security and privacy concerns of performing an evaluation and management service by telemedicine. I also discussed with the patient that there may be a patient responsible charge related to this service. The patient expressed understanding and agreed to proceed.  Pt location: Home Physician Location: office Name of referring provider:  [name], MD I connected with Tracy Malone at patients initiation/request on 10/24/2018 at  2:00 PM EDT by video enabled telemedicine application and verified that I am speaking with the correct person using two identifiers. Pt MRN:  [id number] Pt DOB:  [birth year]   History of Present Illness: Patient is seen via tele-visit for Parkinson's disease.  She is on carbidopa/levodopa 25/100, 1 tablet 3 times per day.  "when I get up in the AM, I feel awful."  Has trouble describing that but does state that she feels "agitated and sad."  There is no falls since last visit.  No lightheadedness or near syncope.  Anxiety/depression continues to be the biggest issue.  She just had a phone visit with her primary care provider regarding this and I reviewed that in detail.  She has emailed me several times since last visit.  She emailed me about her psychiatry starting her on low-dose quetiapine, which I had no objection to.  She emailed me about CBD, which I did not really recommend for Parkinson's disease, due to insufficient data.  She emailed me regarding urinary frequency and thought it was related to levodopa.  I explained that this was not related to levodopa, but could be related to Parkinson's disease.  I offered a referral to urogynecology, which she wanted.  We sent a  referral to Dr. Zigmund Daniel.  She has an appointment on May 13.  Has still been seeing psychiatry.  She is off of selegiline, but states that she wants to go back on it (although stopped it because of complaints about blurry vision) because "my tremor and writing were better."  States that psychiatry doesn't want her back on it.  Daughter is doing grocery shopping given coronavirus.    Observations/Objective:   Vitals:   10/24/18 1414  BP: (!) 117/55  Pulse: 78  Temp: (!) 96.8 F (36 C)  Weight: 110 lb (49.9 kg)    GEN:  The patient appears stated age and is in NAD.  Much more upbeat, less anxious than usual.  Neurological examination:  Orientation: The patient is alert and oriented x3. Cranial nerves: There is good facial symmetry. There is mild facial hypomimia.  The speech is fluent and clear. Soft palate rises symmetrically and there is no tongue deviation. Hearing is intact to conversational tone. Motor: Strength is at least antigravity x 4.   Shoulder shrug is equal and symmetric.  There is no pronator drift.  Movement examination: Tone: unable Abnormal movements: Intermittent RUE resting tremor (but not able to see arms much of video visit) Coordination:  There is no decremation with RAM's,  Gait and Station: The patient has no difficulty arising out of a deep-seated chair without the use of the hands. The patient's stride length is good with good arm swing.      Assessment and Plan:   1.  Idiopathic Parkinson's  disease.  The patient has tremor, bradykinesia, rigidity and mild postural instability.             -On carbidopa/levodopa 25/100 CR, 1 tablet 3 times per day.  Will add an additional carbidopa/levodopa 25/100 CR at bedtime.  She states that the AM's are awful and difficult to tell if this is really an "off" phenomenon or if it is more a psychologic issue.  Pt was agreeable to try.  -Biggest barrier to treatment is actually the patient's anxiety/depression and multiple  intolerances to medication (that are really likely related more to anxiety and depression than true intolerances).             -Encouraged exercise.  Home exercise resources sent to patient.  -Discussed in detail Covid-19 and risk factors for this, including age and PD.  Discussed importance of social distancing.  Discussed importance of staying home at all times, as is feasible.  Discussed taking advantage of grocery store hours for the elderly.  Pt expressed understanding.               2.  PBA              -It certainly is possible that the patient has pseudobulbar affect in addition to depression.  3.  Depression/anxiety             -Patient is following with psychiatry.  This continues to be the biggest issue.    Follow Up Instructions:    -I discussed the assessment and treatment plan with the patient. The patient was provided an opportunity to ask questions and all were answered. The patient agreed with the plan and demonstrated an understanding of the instructions.   The patient was advised to call back or seek an in-person evaluation if the symptoms worsen or if the condition fails to improve as anticipated.    Total Time spent in visit with the patient was:  25 min, of which more than 50% of the time was spent in counseling and/or coordinating care on safety with covid and PD.   Pt understands and agrees with the plan of care outlined.     Alonza Bogus, DO

## 2018-10-22 NOTE — Progress Notes (Signed)
Referring Physician:  [name], MD  Diagnosis Other elevated white blood cell (WBC) count - Plan: CBC with Differential ([facility] Only), CMP ([facility] only), Lactate dehydrogenase (LDH), Sedimentation rate, Ferritin, Iron and TIBC, Jak 2 V617F ([facility]), Urinalysis, Complete w Microscopic, Urine Culture, BCR ABL1 FISH ([facility]), Jak 2 Exon 12 ([facility]), Methylmalonic acid, serum, Rheumatoid factor, Hepatitis B surface antigen, Hepatitis B core antibody, total, Hepatitis B core antibody, IgM, Hepatitis C antibody, HIV antibody (with reflex)  Dysuria - Plan: CBC with Differential ([facility] Only), CMP ([facility] only), Lactate dehydrogenase (LDH), Sedimentation rate, Ferritin, Iron and TIBC, Jak 2 V617F ([facility]), Urinalysis, Complete w Microscopic, Urine Culture, BCR ABL1 FISH ([facility]), Jak 2 Exon 12 ([facility]), Methylmalonic acid, serum, Rheumatoid factor, Hepatitis B surface antigen, Hepatitis B core antibody, total, Hepatitis B core antibody, IgM, Hepatitis C antibody, HIV antibody (with reflex)  Other iron deficiency anemia - Plan: CBC with Differential ([facility] Only), CMP ([facility] only), Lactate dehydrogenase (LDH), Sedimentation rate, Ferritin, Iron and TIBC, Jak 2 V617F ([facility]), Urinalysis, Complete w Microscopic, Urine Culture, BCR ABL1 FISH ([facility]), Jak 2 Exon 12 ([facility]), Methylmalonic acid, serum, Rheumatoid factor, Hepatitis B surface antigen, Hepatitis B core antibody, total, Hepatitis B core antibody, IgM, Hepatitis C antibody, HIV antibody (with reflex)  Screening for HIV without presence of risk factors - Plan: CBC with Differential ([facility] Only), CMP ([facility] only), Lactate dehydrogenase (LDH), Sedimentation rate, Ferritin, Iron and TIBC, Jak 2 V617F ([facility]), Urinalysis, Complete w Microscopic, Urine Culture, BCR ABL1 FISH ([facility]), Jak 2 Exon 12 ([facility]), Methylmalonic acid, serum, Rheumatoid factor, Hepatitis B surface antigen, Hepatitis  B core antibody, total, Hepatitis B core antibody, IgM, Hepatitis C antibody, HIV antibody (with reflex)  Staging Cancer Staging No matching staging information was found for the patient.  Assessment and Plan:  1.  Leucocytosis.  85 year old female referred for evaluation due to leucocytosis.  Pt has history of persistent UTIs and UA done 09/2018 showed 2+ LE, Positive nitrites.  Culture was not sent.  She reports she was treated with Macrobid but did not tolerate therapy.  She denies fevers but reports occasional night sweats.  Labs done 08/22/2018 showed WBC 11.6 HB 14.2 plts 282,000. Normal Differential.   Chemistries WNL with K+ 4.4 Cr 0.7 and normal LFTs.  Labs done 08/29/2018 showed WBC 13.6 HB 15.8 plts 189,000.  Normal Differential.  Labs done 09/30/2018 showed WBC 13.6 hb 15.5 plts 253,000.  Pt reports occasional cloudy urine.  She has Parkinson Disease.  She denies steroid use.  She reports occasional night sweats and skin changes.    Pt is seen today for consultation due to leucocytosis.    Labs done today showed WBC 12 HB 14.4 plts 260,000.  She has a normal differential.  Chemistries WNL with K+ 3.5 Cr 0,75 and normal LFTs.   Discussion held with pt regarding possible etiologies of mildly elevated WBC count. Included in the differential diagnosis is infection.  She has history of frequent UTIs and recent UA done in 09/2018 showed + LE and nitrites.  Culture was not done at that time.  WBC is improved today at 12,000 compared to labs done on 09/30/2018.  Awaiting results of BCR/ABL and Jak 2, hepatitis panel and HIV.   Pt will have phone visit for follow-up to go over labs.  She should notify the office if any change in symptoms prior to follow-up.  All questions answered and she expressed understanding of information presented.  2. Dysuria/UTI.  Pt has history of frequent UTIs.  Awaiting results of urine culture. Pt had USN of abdomen done 10/2016 that was negative.   Discussed with pt that UTIs may  cause leucocytosis.  Pt will be notified of results.    3.  Night sweats and skin changes.  Many people with PD experience trouble with sweating.  Sometimes this occurs on the palms of the hands and soles of the feet.  Drenching sweats, particularly at night, can occur and may be related to therapy with  carbidopa-levodopa or Sinemet.  Skin changes also likely related to PD.  Pt should discuss this further with PCP or neurology.    4.  Parkinson Disease.  Pt is on Sinemet.  Follow-up with neurology as directed.    5.  Joint pain.  Awaiting results of sed rate and RF.  Pt has history of bursitis.    6.  Health maintenance.  Mammogram screenings as recommended.  GI follow-up as recommended.    40 minutes spent with more than 50% spent in review of records, counseling and coordination of care.    HPI:  85 year old female referred for evaluation due to leucocytosis.  Pt has history of persistent UTIs and UA done 09/2018 showed 2+ LE, Positive nitrites.  Culture was not sent.  She reports she was treated with Macrobid but did not tolerate therapy.  She denies fevers but reports occasional night sweats.  Labs done 08/22/2018 showed WBC 11.6 HB 14.2 plts 282,000. Normal Differential.   Chemistries WNL with K+ 4.4 Cr 0.7 and normal LFTs.  Labs done 08/29/2018 showed WBC 13.6 HB 15.8 plts 189,000.  Normal Differential.  Labs done 09/30/2018 showed WBC 13.6 hb 15.5 plts 253,000.  Pt reports occasional cloudy urine.  She has Parkinson Disease.  She denies steroid use.   Pt reports some skin changes and night sweats.   Pt is seen today for consultation due to leucocytosis.    Problem List Patient Active Problem List   Diagnosis Date Noted  . Gait disturbance [R26.9] 11/28/2017  . Chronic lymphocytic thyroiditis [E06.3] 04/24/2017  . Status post removal of thyroid nodule [Z98.890] 04/24/2017  . Depression with anxiety [F41.8] 03/16/2017  . Subcutaneous nodules [R22.9] 03/16/2017  . Aortic atherosclerosis ([facility])  [I70.0] 11/24/2016  . Allergy to multiple antibiotics [Z88.1] 10/07/2016  . Seafood allergy, anaphylaxis, subsequent encounter [T78.03XD] 10/07/2016  . Helicobacter pylori gastritis [K29.70, B96.81] 10/07/2016  . Fall [W19.XXXA] 09/05/2016  . Bloating [R14.0] 08/15/2016  . Severe recurrent major depression without psychotic features ([facility]) [F33.2] 04/13/2016    Class: Chronic  . Rash and nonspecific skin eruption [R21] 03/01/2016  . Leg pain, bilateral [I45.809, M79.605] 03/01/2016  . Functional dyspepsia [K30] 10/13/2015  . Essential tremor [G25.0] 03/02/2015  . Subacromial bursitis [M75.50] 03/02/2015  . Resting tremor [R25.9] 02/01/2015  . Epigastric fullness [R19.06] 10/12/2014  . Early satiety [R68.81] 10/12/2014  . Cystocele [IMO0002] 12/30/2013  . IBS (irritable bowel syndrome) [K58.9] 09/30/2013  . GERD (gastroesophageal reflux disease) [K21.9] 06/27/2013  . PVC's (premature ventricular contractions) [I49.3] 10/02/2012  . Depressive disorder, not elsewhere classified [F32.9] 10/02/2012  . Bradycardia [R00.1] 08/01/2012  . Fatigue [R53.83] 08/01/2012  . Anxiety state [F41.1] 05/13/2012  . Osteopenia [M85.80] 05/13/2012  . Hypothyroidism [E03.9] 05/13/2012    Past Medical History Past Medical History:  Diagnosis Date  . Bell's palsy 1966   ? right side facial droop  . Carotid artery disease ([facility]) 2010   on vascular screening;unchanged 2013.(could not  tolerate simvastatin, no other statins tried)--<30% blockage bilat 07/2011  . Chronic abdominal pain   . Chronic fatigue and malaise   . Claustrophobia   . Depression    treated in the past for years;stopped in 2010 for a years  . Duodenal ulcer 1962   h/o  . Dysrhythmia    ocassional PVC's  . Fibromyalgia   . Frequent PVCs 07/2012   Seen by [facility]: benign, asymptomatic, normal EF  . GERD (gastroesophageal reflux disease)   . Glaucoma, narrow-angle    s/p laser surgery  . History of hiatal hernia    during  endoscopy  . Hypothyroid 9/08  . IBS (irritable bowel syndrome)    Dr. Benson [place]  . Ocular migraine   . Osteoporosis 10/11   Dr.Hawkes  . Panic attack   . Recurrent UTI    has cystocele-Dr.Grewal  . Shingles 1999   h/o  . Superficial thrombophlebitis 03/2009   RLE  . Trochanteric bursitis 12/2008   bilateral    Past Surgical History Past Surgical History:  Procedure Laterality Date  . ABDOMINAL HYSTERECTOMY    . CATARACT EXTRACTION, BILATERAL  1995, 1996  . Flexible sigmoidoscopy    . THYROIDECTOMY, PARTIAL  09/2005   L nodule; Dr. Harlow Asa  . TONSILLECTOMY  1946  . UPPER GI ENDOSCOPY  06/27/12  . VAGINAL HYSTERECTOMY  1971   and bladder repair.  Still has ovaries    Family History Family History  Problem Relation Age of Onset  . Heart disease Mother   . Hypertension Mother   . Hypertension Sister   . HIV Son   . Heart disease Brother   . Lung cancer Brother        lung  . Diabetes Maternal Grandfather   . Diabetes Granddaughter        type 1     Social History  reports that she has never smoked. She has never used smokeless tobacco. She reports that she does not drink alcohol or use drugs.  Medications  Current Outpatient Medications:  .  acetaminophen (TYLENOL) 500 MG tablet, Take 500 mg by mouth every 6 (six) hours as needed., Disp: , Rfl:  .  ALPRAZolam (XANAX) 0.5 MG tablet, TAKE BY MOUTH UP TO 3 TIMES DAILY. WARNING: BENZODIAZEPINES INCREASE THE RISK OF FALLS AND NEUROCOGNITIVE DECLINE IN THE ELDERLY, Disp: 90 tablet, Rfl: 2 .  b complex vitamins capsule, Take 1 capsule by mouth as needed. , Disp: , Rfl:  .  Carbidopa-Levodopa ER (SINEMET CR) 25-100 MG tablet controlled release, TAKE ONE TABLET BY MOUTH THREE TIMES A DAY, Disp: 270 tablet, Rfl: 0 .  Cholecalciferol (VITAMIN D) 2000 UNITS tablet, Take 2,000 Units by mouth daily., Disp: , Rfl:  .  loratadine (CLARITIN) 10 MG tablet, Take 10 mg by mouth daily., Disp: , Rfl:  .  Probiotic Product (ALIGN PO), Take 1  capsule by mouth every other day. , Disp: , Rfl:  .  SYNTHROID 25 MCG tablet, TAKE 1 TABLET DAILY BEFORE BREAKFAST, Disp: 90 tablet, Rfl: 1 .  vitamin E 400 UNIT capsule, Take 400 Units by mouth every Monday, Wednesday, and Friday. Reported on 09/06/2015, Disp: , Rfl:  .  selegiline (ELDEPRYL) 5 MG capsule, Take 5 mg by mouth daily., Disp: , Rfl:  .  vitamin B-12 (CYANOCOBALAMIN) 1000 MCG tablet, Take 1,000 mcg by mouth daily., Disp: , Rfl:   Allergies Iodine; Levsin [hyoscyamine sulfate]; Hyman Hopes allergy]; Shellfish allergy; Remeron [mirtazapine]; Aspirin; Ciprofloxacin; Codeine; Darvocet [propoxyphene n-acetaminophen];  Demerol [meperidine]; Dexilant [dexlansoprazole]; Diphedryl [diphenhydramine]; Doxycycline hyclate; Epinephrine; Erythromycin; Flexeril [cyclobenzaprine]; Keflex [cephalexin]; Latex; Prednisone; Pylera [bis subcit-metronid-tetracyc]; Sulfa antibiotics; Xylocaine [lidocaine hcl]; Zoloft [sertraline hcl]; Advil [ibuprofen]; and Clarithromycin  Review of Systems Review of Systems - Oncology ROS negative other than dysuria, skin changes and night sweats.     Physical Exam  Vitals Wt Readings from Last 3 Encounters:  10/22/18 110 lb 12.8 oz (50.3 kg)  10/01/18 109 lb 9.6 oz (49.7 kg)  09/16/18 109 lb 3.2 oz (49.5 kg)   Temp Readings from Last 3 Encounters:  10/22/18 98 F (36.7 C) (Oral)  10/01/18 97.8 F (36.6 C)  09/16/18 98.8 F (37.1 C) (Tympanic)   BP Readings from Last 3 Encounters:  10/22/18 130/60  10/01/18 120/82  09/16/18 120/70   Pulse Readings from Last 3 Encounters:  10/22/18 80  10/01/18 75  09/16/18 80   Constitutional: Well-developed, well-nourished, and in no distress.   HENT: Head: Normocephalic and atraumatic.  Mouth/Throat: No oropharyngeal exudate. Mucosa moist. Eyes: Pupils are equal, round, and reactive to light. Conjunctivae are normal. No scleral icterus.  Neck: Normal range of motion. Neck supple. No JVD present.  Cardiovascular:  Normal rate, regular rhythm and normal heart sounds.  Exam reveals no gallop and no friction rub.   No murmur heard. Pulmonary/Chest: Effort normal and breath sounds normal. No respiratory distress. No wheezes.No rales.  Abdominal: Soft. Bowel sounds are normal. No distension. There is no tenderness. There is no guarding.  Musculoskeletal: No edema or tenderness.  Lymphadenopathy: No cervical,axillary or supraclavicular adenopathy.  Neurological: Alert and oriented to person, place, and time. Tremor noted on right.   Skin: Skin is warm and dry. Small hemangiomas noted on chest and abdomen.   Psychiatric: Affect and judgment normal.   Labs Appointment on 10/22/2018  Component Date Value Ref Range Status  . Color, Urine 10/22/2018 YELLOW  YELLOW Final  . APPearance 10/22/2018 HAZY* CLEAR Final  . Specific Gravity, Urine 10/22/2018 1.010  1.005 - 1.030 Final  . pH 10/22/2018 5.0  5.0 - 8.0 Final  . Glucose, UA 10/22/2018 NEGATIVE  NEGATIVE mg/dL Final  . Hgb urine dipstick 10/22/2018 NEGATIVE  NEGATIVE Final  . Bilirubin Urine 10/22/2018 NEGATIVE  NEGATIVE Final  . Ketones, ur 10/22/2018 NEGATIVE  NEGATIVE mg/dL Final  . Protein, ur 10/22/2018 NEGATIVE  NEGATIVE mg/dL Final  . Nitrite 10/22/2018 NEGATIVE  NEGATIVE Final  . Leukocytes,Ua 10/22/2018 NEGATIVE  NEGATIVE Final  . RBC / HPF 10/22/2018 0-5  0 - 5 RBC/hpf Final  . WBC, UA 10/22/2018 0-5  0 - 5 WBC/hpf Final  . Bacteria, UA 10/22/2018 NONE SEEN  NONE SEEN Final  . Squamous Epithelial / LPF 10/22/2018 0-5  0 - 5 Final  . Mucus 10/22/2018 PRESENT   Final   Performed at [facility], [place] [address] [place], [place] [address]  . Sodium 10/22/2018 139  135 - 145 mmol/L Final  . Potassium 10/22/2018 3.5  3.5 - 5.1 mmol/L Final  . Chloride 10/22/2018 103  98 - 111 mmol/L Final  . CO2 10/22/2018 25  22 - 32 mmol/L Final  . Glucose, Bld 10/22/2018 96  70 - 99 mg/dL Final  . BUN 10/22/2018 13  8 - 23 mg/dL Final  .  Creatinine 10/22/2018 0.75  0.44 - 1.00 mg/dL Final  . Calcium 10/22/2018 9.2  8.9 - 10.3 mg/dL Final  . Total Protein 10/22/2018 7.4  6.5 - 8.1 g/dL Final  . Albumin 10/22/2018 3.5  3.5 -  5.0 g/dL Final  . AST 10/22/2018 17  15 - 41 U/L Final  . ALT 10/22/2018 7  0 - 44 U/L Final  . Alkaline Phosphatase 10/22/2018 74  38 - 126 U/L Final  . Total Bilirubin 10/22/2018 0.8  0.3 - 1.2 mg/dL Final  . GFR, Est Non Af Am 10/22/2018 >60  >60 mL/min Final  . GFR, Est AFR Am 10/22/2018 >60  >60 mL/min Final  . Anion gap 10/22/2018 11  5 - 15 Final   Performed at [facility], [place] [address] [place], [place] [address]  . WBC Count 10/22/2018 12.0* 4.0 - 10.5 K/uL Final  . RBC 10/22/2018 4.81  3.87 - 5.11 MIL/uL Final  . Hemoglobin 10/22/2018 14.4  12.0 - 15.0 g/dL Final  . HCT 10/22/2018 46.4* 36.0 - 46.0 % Final  . MCV 10/22/2018 96.5  80.0 - 100.0 fL Final  . MCH 10/22/2018 29.9  26.0 - 34.0 pg Final  . MCHC 10/22/2018 31.0  30.0 - 36.0 g/dL Final  . RDW 10/22/2018 14.2  11.5 - 15.5 % Final  . Platelet Count 10/22/2018 260  150 - 400 K/uL Final  . nRBC 10/22/2018 0.0  0.0 - 0.2 % Final  . Neutrophils Relative % 10/22/2018 69  % Final  . Neutro Abs 10/22/2018 8.3* 1.7 - 7.7 K/uL Final  . Lymphocytes Relative 10/22/2018 20  % Final  . Lymphs Abs 10/22/2018 2.3  0.7 - 4.0 K/uL Final  . Monocytes Relative 10/22/2018 8  % Final  . Monocytes Absolute 10/22/2018 1.0  0.1 - 1.0 K/uL Final  . Eosinophils Relative 10/22/2018 1  % Final  . Eosinophils Absolute 10/22/2018 0.1  0.0 - 0.5 K/uL Final  . Basophils Relative 10/22/2018 1  % Final  . Basophils Absolute 10/22/2018 0.1  0.0 - 0.1 K/uL Final  . Immature Granulocytes 10/22/2018 1  % Final  . Abs Immature Granulocytes 10/22/2018 0.14* 0.00 - 0.07 K/uL Final   Performed at [facility], [place] [address] [place], [place] [address]     Pathology Orders Placed This Encounter  Procedures  . Urine  Culture    Standing Status:   Future    Number of Occurrences:   1    Standing Expiration Date:   10/22/2019  . CBC with Differential (Cancer Center Only)    Standing Status:   Future    Number of Occurrences:   1    Standing Expiration Date:   10/22/2019  . CMP ([facility] only)    Standing Status:   Future    Number of Occurrences:   1    Standing Expiration Date:   10/22/2019  . Lactate dehydrogenase (LDH)    Standing Status:   Future    Number of Occurrences:   1    Standing Expiration Date:   10/22/2019  . Sedimentation rate    Standing Status:   Future    Number of Occurrences:   1    Standing Expiration Date:   10/22/2019  . Ferritin    Standing Status:   Future    Number of Occurrences:   1    Standing Expiration Date:   10/22/2019  . Iron and TIBC    Standing Status:   Future    Number of Occurrences:   1    Standing Expiration Date:   10/22/2019  . Jak 2 V617F ([facility])    Standing Status:   Future    Number  of Occurrences:   1    Standing Expiration Date:   10/22/2019  . Urinalysis, Complete w Microscopic    Standing Status:   Future    Number of Occurrences:   1    Standing Expiration Date:   10/22/2019  . BCR ABL1 FISH ([facility])    Standing Status:   Future    Number of Occurrences:   1    Standing Expiration Date:   10/22/2019  . Jak 2 Exon 12 ([facility])    Standing Status:   Future    Number of Occurrences:   1    Standing Expiration Date:   10/22/2019  . Methylmalonic acid, serum    Standing Status:   Future    Number of Occurrences:   1    Standing Expiration Date:   10/22/2019  . Rheumatoid factor    Standing Status:   Future    Number of Occurrences:   1    Standing Expiration Date:   10/22/2019  . Hepatitis B surface antigen    Standing Status:   Future    Number of Occurrences:   1    Standing Expiration Date:   10/22/2019  . Hepatitis B core antibody, total    Standing Status:   Future    Number of Occurrences:   1    Standing Expiration Date:    10/22/2019  . Hepatitis B core antibody, IgM    Standing Status:   Future    Number of Occurrences:   1    Standing Expiration Date:   10/22/2019  . Hepatitis C antibody    Standing Status:   Future    Number of Occurrences:   1    Standing Expiration Date:   10/22/2019  . HIV antibody (with reflex)    Standing Status:   Future    Number of Occurrences:   1    Standing Expiration Date:   10/22/2019       Zoila Shutter MD

## 2018-10-23 ENCOUNTER — Encounter: Payer: Self-pay | Admitting: *Deleted

## 2018-10-23 ENCOUNTER — Telehealth: Payer: Self-pay | Admitting: *Deleted

## 2018-10-23 LAB — METHYLMALONIC ACID, SERUM: Methylmalonic Acid, Quantitative: 160 nmol/L (ref 0–378)

## 2018-10-23 LAB — HEPATITIS B CORE ANTIBODY, IGM: Hep B C IgM: NEGATIVE

## 2018-10-23 LAB — HEPATITIS B SURFACE ANTIGEN: Hepatitis B Surface Ag: NEGATIVE

## 2018-10-23 LAB — URINE CULTURE

## 2018-10-23 LAB — RHEUMATOID FACTOR: Rhuematoid fact SerPl-aCnc: 10.3 IU/mL (ref 0.0–13.9)

## 2018-10-23 LAB — HIV ANTIBODY (ROUTINE TESTING W REFLEX): HIV SCREEN 4TH GENERATION: NONREACTIVE

## 2018-10-23 LAB — HEPATITIS B CORE ANTIBODY, TOTAL: Hep B Core Total Ab: NEGATIVE

## 2018-10-23 LAB — IRON AND TIBC
Iron: 67 ug/dL (ref 41–142)
Saturation Ratios: 19 % — ABNORMAL LOW (ref 21–57)
TIBC: 358 ug/dL (ref 236–444)
UIBC: 291 ug/dL (ref 120–384)

## 2018-10-23 LAB — HEPATITIS C ANTIBODY: HCV Ab: <0.1 s/co ratio (ref 0.0–0.9)

## 2018-10-23 LAB — FERRITIN: Ferritin: 76 ng/mL (ref 11–307)

## 2018-10-23 NOTE — Telephone Encounter (Signed)
TCT to patient regarding urine culture results. Per Dr. Walden Field, urine culture is negative. Spoke with patient and informed her of the above. Also advised that her next appt with Dr. Walden Field will be via the phone in about 2 weeks and a scheduler will call her her with date and time. Pt appreciative of call. No questions or concerns

## 2018-10-24 ENCOUNTER — Telehealth (INDEPENDENT_AMBULATORY_CARE_PROVIDER_SITE_OTHER): Payer: Medicare Other | Admitting: Neurology

## 2018-10-24 ENCOUNTER — Ambulatory Visit: Payer: Self-pay | Admitting: Neurology

## 2018-10-24 ENCOUNTER — Encounter: Payer: Self-pay | Admitting: Neurology

## 2018-10-24 ENCOUNTER — Other Ambulatory Visit: Payer: Self-pay

## 2018-10-24 ENCOUNTER — Telehealth: Payer: Self-pay | Admitting: Internal Medicine

## 2018-10-24 DIAGNOSIS — F411 Generalized anxiety disorder: Secondary | ICD-10-CM | POA: Diagnosis not present

## 2018-10-24 DIAGNOSIS — G20 Parkinson's disease: Secondary | ICD-10-CM | POA: Diagnosis not present

## 2018-10-24 MED ORDER — CARBIDOPA-LEVODOPA ER 25-100 MG PO TBCR: 1.0000 | EXTENDED_RELEASE_TABLET | Freq: Four times a day (QID) | ORAL | 1 refills | Status: DC

## 2018-10-24 NOTE — Telephone Encounter (Signed)
Patient's updated calender will be mailed.  °

## 2018-10-28 ENCOUNTER — Telehealth: Payer: Self-pay | Admitting: Neurology

## 2018-10-28 ENCOUNTER — Encounter: Payer: Self-pay | Admitting: Family Medicine

## 2018-10-29 ENCOUNTER — Encounter: Payer: Self-pay | Admitting: Family Medicine

## 2018-10-31 ENCOUNTER — Encounter: Payer: Self-pay | Admitting: Family Medicine

## 2018-10-31 DIAGNOSIS — R197 Diarrhea, unspecified: Secondary | ICD-10-CM | POA: Diagnosis not present

## 2018-10-31 DIAGNOSIS — R350 Frequency of micturition: Secondary | ICD-10-CM | POA: Diagnosis not present

## 2018-11-01 ENCOUNTER — Telehealth: Payer: Self-pay | Admitting: Internal Medicine

## 2018-11-01 NOTE — Telephone Encounter (Signed)
[facility] out of office 4/14. Telephone visit moved from 4/14 to 4/17. Confirmed with patient this will still be a telephone visit.

## 2018-11-05 ENCOUNTER — Ambulatory Visit: Payer: Self-pay | Admitting: Internal Medicine

## 2018-11-05 DIAGNOSIS — R197 Diarrhea, unspecified: Secondary | ICD-10-CM | POA: Diagnosis not present

## 2018-11-06 ENCOUNTER — Encounter: Payer: Self-pay | Admitting: Family Medicine

## 2018-11-07 ENCOUNTER — Telehealth: Payer: Self-pay | Admitting: *Deleted

## 2018-11-07 NOTE — Telephone Encounter (Signed)
Contacted and spoke with patient to arrange a Virtual check in visit. Visit scheduled for 11/12/18 at 1p.

## 2018-11-08 ENCOUNTER — Inpatient Hospital Stay: Payer: Medicare Other | Attending: Internal Medicine | Admitting: Internal Medicine

## 2018-11-08 DIAGNOSIS — D72828 Other elevated white blood cell count: Secondary | ICD-10-CM

## 2018-11-08 NOTE — Progress Notes (Signed)
Virtual Visit via Telephone Note  I connected with Tracy Malone on 11/08/18 at  2:20 PM EDT by telephone and verified that I am speaking with the correct person using two identifiers.   I discussed the limitations, risks, security and privacy concerns of performing an evaluation and management service by telephone and the availability of in person appointments. I also discussed with the patient that there may be a patient responsible charge related to this service. The patient expressed understanding and agreed to proceed.  Interval history:  Historical data obtained from note dated 10/22/2018.  85 year old female referred for evaluation due to leucocytosis.  Pt has history of persistent UTIs and UA done 09/2018 showed 2+ LE, Positive nitrites.  Culture was not sent.  She reports she was treated with Macrobid but did not tolerate therapy.  She denies fevers but reports occasional night sweats.  Labs done 08/22/2018 showed WBC 11.6 HB 14.2 plts 282,000. Normal Differential.   Chemistries WNL with K+ 4.4 Cr 0.7 and normal LFTs.  Labs done 08/29/2018 showed WBC 13.6 HB 15.8 plts 189,000.  Normal Differential.  Labs done 09/30/2018 showed WBC 13.6 hb 15.5 plts 253,000.  Pt reports occasional cloudy urine.  She has Parkinson Disease.  She denies steroid use.   Pt reports some skin changes and night sweats.     Observations/Objective:Review labs from 10/22/2018   Assessment and Plan: 1.  Leucocytosis.  85 year old female referred for evaluation due to leucocytosis.  Pt has history of persistent UTIs and UA done 09/2018 showed 2+ LE, Positive nitrites.  Culture was not sent.  She reports she was treated with Macrobid but did not tolerate therapy.  She denies fevers but reports occasional night sweats.  Labs done 08/22/2018 showed WBC 11.6 HB 14.2 plts 282,000. Normal Differential.   Chemistries WNL with K+ 4.4 Cr 0.7 and normal LFTs.  Labs done 08/29/2018 showed WBC 13.6 HB 15.8 plts 189,000.  Normal Differential.  Labs  done 09/30/2018 showed WBC 13.6 hb 15.5 plts 253,000.  Pt reports occasional cloudy urine.  She has Parkinson Disease.  She denies steroid use.  She reports occasional night sweats and skin changes.     Labs done 10/22/2018 reviewed and showed WBC 12 HB 14.4 plts 260,000.  She has a normal differential.  Chemistries WNL with K+ 3.5 Cr 0,75 and normal LFTs. Ferritin normal at 76.  Sed rate normal at 4.  RF normal at 10.   Hepatitis and HIV testing negative.  Jak 2 and BCR/ABL testing negative.    I have discussed with pt work-up is negative.  She has normal WBC with minimal periodic increase that is felt not to be clinically significant.    I have discussed with her possible etiologies of mildly elevated WBC count and included in the differential diagnosis is infection.  She has history of frequent UTIs and recent UA done in 09/2018 showed + LE and nitrites.  Culture was not done at that time.  WBC is improved on 10/22/2018  at 12,000 compared to labs done on 09/30/2018.  Urine culture was negative.  Pt will have repeat labs in 03/2019 and will follow-up at that time to go over results.    She should notify the office if any change in symptoms prior to follow-up.  All questions answered and she expressed understanding of information presented.    2. Dysuria/UTI.  Pt has history of frequent UTIs.  UA done 10/22/2018 showed negative urine culture. Pt had  USN of abdomen done 10/2016 that was negative.   Discussed with pt that UTIs may cause leucocytosis.  Follow-up with PCP or GYN if ongoing symptoms.   3.  Night sweats and skin changes.  Many people with PD experience trouble with sweating.  Sometimes this occurs on the palms of the hands and soles of the feet.  Drenching sweats, particularly at night, can occur and may be related to therapy with  carbidopa-levodopa or Sinemet.  Skin changes also likely related to PD.  Pt should discuss this further with PCP or neurology.    4.  Parkinson Disease.  Pt is on  Sinemet.  Follow-up with neurology as directed.    5.  Joint pain.  Pt has normal sed rate and RF.  Pt has history of bursitis.  Follow-up with PCP if ongoing symptoms.    6.  Health maintenance.  Mammogram screenings as recommended.  GI follow-up as recommended.    Follow Up Instructions: follow-up in 03/2019 with labs.      I discussed the assessment and treatment plan with the patient. The patient was provided an opportunity to ask questions and all were answered. The patient agreed with the plan and demonstrated an understanding of the instructions.   The patient was advised to call back or seek an in-person evaluation if the symptoms worsen or if the condition fails to improve as anticipated.  I provided 15 minutes of non-face-to-face time during this encounter.   Zoila Shutter, MD

## 2018-11-12 ENCOUNTER — Other Ambulatory Visit: Payer: Medicare Other | Admitting: Licensed Clinical Social Worker

## 2018-11-12 ENCOUNTER — Other Ambulatory Visit: Payer: Self-pay

## 2018-11-12 ENCOUNTER — Other Ambulatory Visit: Payer: Medicare Other | Admitting: *Deleted

## 2018-11-12 DIAGNOSIS — Z515 Encounter for palliative care: Secondary | ICD-10-CM

## 2018-11-12 NOTE — Progress Notes (Signed)
COMMUNITY PALLIATIVE CARE SW NOTE  PATIENT NAME: Tracy Malone DOB: Feb 21, 1932 MRN: [id number]  PRIMARY CARE PROVIDER: [name], MD  RESPONSIBLE PARTY:  Acct ID - Guarantor Home Phone Work Phone Relationship Acct Type  [id number] [name] Self P/F     [address], [place], [place] [address]   Due to the COVID-19 crisis, this virtual check-in visit was done via telephone from my office and it was initiated and consent given by this patient.  PLAN OF CARE and INTERVENTIONS:             1. GOALS OF CARE/ ADVANCE CARE PLANNING:  Goal is for patient to remain at home with her husband, Gwyndolyn Saxon, who is also a Palliative Care patient.  Patient is a full code.  She does not wish to be hospitalized. 2. SOCIAL/EMOTIONAL/SPIRITUAL ASSESSMENT/ INTERVENTIONS:  SW and Palliative Care RN, [name], conducted a virtual check-in visit with patient.  She was focused on the pandemic initially.  Patient remains concerned regarding her her health also.  Provided active listening and supportive counseling.  Watching her priest on video helps affirm her faith.  She enjoys video chats with her son in [place], but misses giving hugs to her daughter and grandchildren. 3. PATIENT/CAREGIVER EDUCATION/ COPING:  Patient copes by expressing her feelings openly.  Provided education regarding virtual check-in visits. 4. PERSONAL EMERGENCY PLAN:  She will email or call her doctors 5. COMMUNITY RESOURCES COORDINATION/ HEALTH CARE NAVIGATION:  She has assistance with housekeeping. 6. FINANCIAL/LEGAL CONCERNS/INTERVENTIONS:  None.     SOCIAL HX:  Social History   Tobacco Use  . Smoking status: Never Smoker  . Smokeless tobacco: Never Used  Substance Use Topics  . Alcohol use: No    Alcohol/week: 0.0 standard drinks    CODE STATUS:  Full Code  ADVANCED DIRECTIVES: N MOST FORM COMPLETE:  N HOSPICE EDUCATION PROVIDED: N PPS:  Patient's appetite is normal.  She ambulates  independently. Duration of visit and documentation:  60 minutes.       Creola Corn Adetokunbo Mccadden, LCSW

## 2018-11-13 NOTE — Progress Notes (Signed)
COMMUNITY PALLIATIVE CARE RN NOTE  PATIENT NAME: Tracy Malone DOB: 1931-09-20 MRN: [id number]  PRIMARY CARE PROVIDER: [name], MD  RESPONSIBLE PARTY:  Acct ID - Guarantor Home Phone Work Phone Relationship Acct Type  [id number] [name] Self P/F     [address], [place], [place] [address]   Due to the COVID-19 crisis, this virtual check-in visit was done via telephone from my office and it was initiated and consent by this patient and or family.  PLAN OF CARE and INTERVENTION:  1. ADVANCE CARE PLANNING/GOALS OF CARE: Goal is for patient to remain at home with her husband. She is a Full Code. 2. PATIENT/CAREGIVER EDUCATION: Relaxation Techniques 3. DISEASE STATUS: Joint virtual check in visit made with Palliative Care SW, Jeani Hawking Duffy. Patient denies pain, and for most of conversation was in a pleasant mood. She did become tearful when speaking about her son in [place]. She reports feeling "lazy and tired" and not wanting to do much of anything. She did start making face masks, however says that she has been unable to complete this task. She has been disinterested in most activities. She is concerned that her Carbidopa/Levadopa is causing increased weakness and fatigue, especially in the am. She did not take this medication this am and reports feeling much better. She says that she will take this medication in the afternoon and evenings only and see how she feels. She had a recent appointment with her Hematologist and Gynecologist. Her WBC was elevated at 13.0. She is to have her WBC rechecked this week. She states that during her Gynecology appointment, that when her colon was checked, it caused pain so there is a suspicion that something may be going on with her colon causing this WBC elevation. She says that her stomach had been very swollen, to the point where she was having difficulty buttoning her pants, but has since resolved. She is taking Miralax daily, which  is helping with constipation. She has also been referred to a Urologist. She has a history of UTIs and prolapsed bladder. She then spoke of her depression and reports crying almost all day yesterday. Husband tries to remain supportive. She continues not to have much of an appetite, but does eat 3 small meals/day and drinks Boost for added nutritional supplementation. She is appreciative of visit. Will continue to monitor.   HISTORY OF PRESENT ILLNESS:  This is a 85 yo female who resides at home with her husband. Palliative Care Team continues to follow patient. Will continue to check in with patient monthly and PRN.  CODE STATUS: Full Code ADVANCED DIRECTIVES: Y MOST FORM: no PPS: 50%  (Duration of visit and documentation 60 minutes)   [name], RN BSN

## 2018-11-14 DIAGNOSIS — D72829 Elevated white blood cell count, unspecified: Secondary | ICD-10-CM | POA: Diagnosis not present

## 2018-11-15 LAB — JAK 2 V617F (GENPATH)

## 2018-11-15 LAB — BCR ABL1 FISH (GENPATH)

## 2018-11-15 LAB — JAK 2 EXON 12 (GENPATH)

## 2018-11-17 ENCOUNTER — Encounter: Payer: Self-pay | Admitting: Family Medicine

## 2018-11-18 ENCOUNTER — Encounter: Payer: Self-pay | Admitting: Family Medicine

## 2018-11-18 DIAGNOSIS — R1084 Generalized abdominal pain: Secondary | ICD-10-CM

## 2018-11-19 ENCOUNTER — Telehealth: Payer: Self-pay | Admitting: *Deleted

## 2018-11-19 NOTE — Telephone Encounter (Signed)
Received call from pt requesting recent labs (10/22/18) be fax'd to her GI doctor-Dr. Owens Loffler. This was done.

## 2018-11-21 ENCOUNTER — Other Ambulatory Visit: Payer: Self-pay

## 2018-11-21 ENCOUNTER — Ambulatory Visit: Payer: Medicare Other | Admitting: Psychiatry

## 2018-11-21 ENCOUNTER — Encounter (HOSPITAL_COMMUNITY): Payer: Self-pay

## 2018-11-21 ENCOUNTER — Inpatient Hospital Stay (HOSPITAL_COMMUNITY)
Admission: EM | Admit: 2018-11-21 | Discharge: 2018-11-25 | DRG: 689 | Disposition: A | Discharge status: Home / Self Care | Payer: Medicare Other | Attending: Family Medicine | Admitting: Family Medicine

## 2018-11-21 DIAGNOSIS — R0902 Hypoxemia: Secondary | ICD-10-CM | POA: Diagnosis not present

## 2018-11-21 DIAGNOSIS — D72829 Elevated white blood cell count, unspecified: Secondary | ICD-10-CM

## 2018-11-21 DIAGNOSIS — Z9842 Cataract extraction status, left eye: Secondary | ICD-10-CM

## 2018-11-21 DIAGNOSIS — R197 Diarrhea, unspecified: Secondary | ICD-10-CM | POA: Diagnosis not present

## 2018-11-21 DIAGNOSIS — E872 Acidosis: Secondary | ICD-10-CM | POA: Diagnosis not present

## 2018-11-21 DIAGNOSIS — I959 Hypotension, unspecified: Secondary | ICD-10-CM | POA: Diagnosis not present

## 2018-11-21 DIAGNOSIS — Z1159 Encounter for screening for other viral diseases: Secondary | ICD-10-CM

## 2018-11-21 DIAGNOSIS — R1032 Left lower quadrant pain: Secondary | ICD-10-CM | POA: Diagnosis not present

## 2018-11-21 DIAGNOSIS — Z886 Allergy status to analgesic agent status: Secondary | ICD-10-CM

## 2018-11-21 DIAGNOSIS — Z91041 Radiographic dye allergy status: Secondary | ICD-10-CM

## 2018-11-21 DIAGNOSIS — K55039 Acute (reversible) ischemia of large intestine, extent unspecified: Secondary | ICD-10-CM | POA: Diagnosis not present

## 2018-11-21 DIAGNOSIS — Z91013 Allergy to seafood: Secondary | ICD-10-CM

## 2018-11-21 DIAGNOSIS — Z8711 Personal history of peptic ulcer disease: Secondary | ICD-10-CM

## 2018-11-21 DIAGNOSIS — K219 Gastro-esophageal reflux disease without esophagitis: Secondary | ICD-10-CM | POA: Diagnosis present

## 2018-11-21 DIAGNOSIS — K559 Vascular disorder of intestine, unspecified: Secondary | ICD-10-CM

## 2018-11-21 DIAGNOSIS — E876 Hypokalemia: Secondary | ICD-10-CM | POA: Diagnosis present

## 2018-11-21 DIAGNOSIS — Z884 Allergy status to anesthetic agent status: Secondary | ICD-10-CM

## 2018-11-21 DIAGNOSIS — G20 Parkinson's disease: Secondary | ICD-10-CM | POA: Diagnosis not present

## 2018-11-21 DIAGNOSIS — N3000 Acute cystitis without hematuria: Secondary | ICD-10-CM | POA: Diagnosis not present

## 2018-11-21 DIAGNOSIS — N390 Urinary tract infection, site not specified: Secondary | ICD-10-CM | POA: Diagnosis present

## 2018-11-21 DIAGNOSIS — R1084 Generalized abdominal pain: Secondary | ICD-10-CM | POA: Diagnosis not present

## 2018-11-21 DIAGNOSIS — I493 Ventricular premature depolarization: Secondary | ICD-10-CM | POA: Diagnosis present

## 2018-11-21 DIAGNOSIS — Z91040 Latex allergy status: Secondary | ICD-10-CM

## 2018-11-21 DIAGNOSIS — K6389 Other specified diseases of intestine: Secondary | ICD-10-CM | POA: Diagnosis not present

## 2018-11-21 DIAGNOSIS — Z885 Allergy status to narcotic agent status: Secondary | ICD-10-CM

## 2018-11-21 DIAGNOSIS — Z8672 Personal history of thrombophlebitis: Secondary | ICD-10-CM

## 2018-11-21 DIAGNOSIS — E890 Postprocedural hypothyroidism: Secondary | ICD-10-CM | POA: Diagnosis present

## 2018-11-21 DIAGNOSIS — N83209 Unspecified ovarian cyst, unspecified side: Secondary | ICD-10-CM

## 2018-11-21 DIAGNOSIS — K5900 Constipation, unspecified: Secondary | ICD-10-CM | POA: Diagnosis not present

## 2018-11-21 DIAGNOSIS — F40240 Claustrophobia: Secondary | ICD-10-CM | POA: Diagnosis present

## 2018-11-21 DIAGNOSIS — Z881 Allergy status to other antibiotic agents status: Secondary | ICD-10-CM

## 2018-11-21 DIAGNOSIS — Z87440 Personal history of urinary (tract) infections: Secondary | ICD-10-CM

## 2018-11-21 DIAGNOSIS — I10 Essential (primary) hypertension: Secondary | ICD-10-CM | POA: Diagnosis not present

## 2018-11-21 DIAGNOSIS — Z90710 Acquired absence of both cervix and uterus: Secondary | ICD-10-CM

## 2018-11-21 DIAGNOSIS — Z9841 Cataract extraction status, right eye: Secondary | ICD-10-CM

## 2018-11-21 DIAGNOSIS — E039 Hypothyroidism, unspecified: Secondary | ICD-10-CM | POA: Diagnosis present

## 2018-11-21 DIAGNOSIS — N83292 Other ovarian cyst, left side: Secondary | ICD-10-CM | POA: Diagnosis not present

## 2018-11-21 DIAGNOSIS — K529 Noninfective gastroenteritis and colitis, unspecified: Secondary | ICD-10-CM

## 2018-11-21 DIAGNOSIS — F410 Panic disorder [episodic paroxysmal anxiety] without agoraphobia: Secondary | ICD-10-CM | POA: Diagnosis present

## 2018-11-21 DIAGNOSIS — K581 Irritable bowel syndrome with constipation: Secondary | ICD-10-CM | POA: Diagnosis present

## 2018-11-21 DIAGNOSIS — Z79890 Hormone replacement therapy (postmenopausal): Secondary | ICD-10-CM

## 2018-11-21 DIAGNOSIS — M797 Fibromyalgia: Secondary | ICD-10-CM | POA: Diagnosis present

## 2018-11-21 DIAGNOSIS — B961 Klebsiella pneumoniae [K. pneumoniae] as the cause of diseases classified elsewhere: Secondary | ICD-10-CM | POA: Diagnosis present

## 2018-11-21 DIAGNOSIS — R911 Solitary pulmonary nodule: Secondary | ICD-10-CM | POA: Diagnosis present

## 2018-11-21 DIAGNOSIS — Z888 Allergy status to other drugs, medicaments and biological substances status: Secondary | ICD-10-CM

## 2018-11-21 DIAGNOSIS — M810 Age-related osteoporosis without current pathological fracture: Secondary | ICD-10-CM | POA: Diagnosis present

## 2018-11-21 DIAGNOSIS — Z880 Allergy status to penicillin: Secondary | ICD-10-CM

## 2018-11-21 DIAGNOSIS — Z882 Allergy status to sulfonamides status: Secondary | ICD-10-CM

## 2018-11-21 DIAGNOSIS — Z79899 Other long term (current) drug therapy: Secondary | ICD-10-CM

## 2018-11-21 HISTORY — DX: Vascular disorder of intestine, unspecified: K55.9

## 2018-11-21 LAB — CBC WITH DIFFERENTIAL/PLATELET
Abs Immature Granulocytes: 0.2 10*3/uL — ABNORMAL HIGH (ref 0.00–0.07)
Basophils Absolute: 0.1 10*3/uL (ref 0.0–0.1)
Basophils Relative: 0 %
Eosinophils Absolute: 0.1 10*3/uL (ref 0.0–0.5)
Eosinophils Relative: 0 %
HCT: 47.4 % — ABNORMAL HIGH (ref 36.0–46.0)
Hemoglobin: 14.7 g/dL (ref 12.0–15.0)
Immature Granulocytes: 1 %
Lymphocytes Relative: 4 %
Lymphs Abs: 0.9 10*3/uL (ref 0.7–4.0)
MCH: 30.3 pg (ref 26.0–34.0)
MCHC: 31 g/dL (ref 30.0–36.0)
MCV: 97.7 fL (ref 80.0–100.0)
Monocytes Absolute: 0.7 10*3/uL (ref 0.1–1.0)
Monocytes Relative: 3 %
Neutro Abs: 18.8 10*3/uL — ABNORMAL HIGH (ref 1.7–7.7)
Neutrophils Relative %: 92 %
Platelets: 288 10*3/uL (ref 150–400)
RBC: 4.85 MIL/uL (ref 3.87–5.11)
RDW: 13.2 % (ref 11.5–15.5)
WBC: 20.8 10*3/uL — ABNORMAL HIGH (ref 4.0–10.5)
nRBC: 0 % (ref 0.0–0.2)

## 2018-11-21 MED ORDER — FENTANYL CITRATE (PF) 100 MCG/2ML IJ SOLN
50.0000 ug | Freq: Once | INTRAMUSCULAR | Status: AC
  Administered 2018-11-21: 50 ug via INTRAVENOUS
  Filled 2018-11-21: qty 2

## 2018-11-21 MED ORDER — ONDANSETRON HCL 4 MG/2ML IJ SOLN
4.0000 mg | Freq: Once | INTRAMUSCULAR | Status: AC
  Administered 2018-11-21: 4 mg via INTRAVENOUS
  Filled 2018-11-21: qty 2

## 2018-11-21 NOTE — ED Triage Notes (Signed)
Pt arrived via EMS from home. Pt c/o lower abdominal pain / cramping. Pt had be constipated and had a BM today and began having cramping afterwards.Pt denies taking mediation for pain.    EMS v/s 139/58, HR 70, 97% RA, RR 16, CBG 163  Husband [phone]

## 2018-11-21 NOTE — ED Provider Notes (Signed)
[facility] DEPT Provider Note   CSN: [id number] Arrival date & time: 11/21/18  2128    History   Chief Complaint Chief Complaint  Patient presents with   Abdominal Pain    HPI Tracy Malone is a 85 y.o. female with a history of Parkinson's disease, H. pylori gastritis, aortic atherosclerosis, GERD, IBS, and anxiety who presents to the emergency department with a chief complaint of rectal bleeding.  The patient reports she has been having intermittent episodes of lower abdominal cramping and generalized weakness for the last 3 weeks.  She reports the abdominal cramping is intermittent and lasts for about 40 to 45 minutes before spontaneously resolving.  No known aggravating or alleviating factors.  She reports that when the symptoms initially began that she was seen by her OB who advised her to follow-up with GI.  She was diagnosed with a UTI and was started on nitrofurantoin, which was later changed to ciprofloxacin.  She reports worsening abdominal pain and frequent episodes of diarrhea while taking antibiotics.  She reports that diarrhea resolved after finishing her last dose of antibiotics.  She reports that yesterday she did have a small bowel movement, but reports she had been constipated for 5 days prior.  She reports that earlier tonight she began to feel clammy, nauseated in addition to feeling more generally weak than she has been feeling over the last few weeks.  She reports had a very large bowel movement and noted bright red blood in the stool, which was new.  She reports that after the bowel movement that she was feeling very weak and was unable to get off the toilet for approximately 15 minutes.  She reports the abdominal cramping then worsened again and she had a second bowel movement where she only passed a small amount of bright red blood, but no stool. She reports   She denies vomiting, dysuria, hematuria, vaginal pain, bleeding, discharge,  shortness of breath, chest pain, fever, chills, back pain, rash, or rectal pain.  She has been treating her symptoms at home with Tylenol with no improvement.  Per chart review, the patient was started on Macrobid on 10/01/2018, but was not tolerating the medication it was changed to ciprofloxacin on 3/13-24/20.      The history is provided by the patient. No language interpreter was used.    Past Medical History:  Diagnosis Date   Bell's palsy 1966   ? right side facial droop   Carotid artery disease ([place]) 2010   on vascular screening;unchanged 2013.(could not tolerate simvastatin, no other statins tried)--<30% blockage bilat 07/2011   Chronic abdominal pain    Chronic fatigue and malaise    Claustrophobia    Depression    treated in the past for years;stopped in 2010 for a years   Duodenal ulcer 1962   h/o   Dysrhythmia    ocassional PVC's   Fibromyalgia    Frequent PVCs 07/2012   Seen by [facility]: benign, asymptomatic, normal EF   GERD (gastroesophageal reflux disease)    Glaucoma, narrow-angle    s/p laser surgery   History of hiatal hernia    during endoscopy   Hypothyroid 9/08   IBS (irritable bowel syndrome)    Dr. Benson [place]   Ocular migraine    Osteoporosis 10/11   Dr.Hawkes   Panic attack    Recurrent UTI    has cystocele-Dr.Grewal   Shingles 1999   h/o   Superficial thrombophlebitis 03/2009   RLE  Trochanteric bursitis 12/2008   bilateral    Patient Active Problem List   Diagnosis Date Noted   Parkinson's disease ([place]) 10/24/2018   Gait disturbance 11/28/2017   Chronic lymphocytic thyroiditis 04/24/2017   Status post removal of thyroid nodule 04/24/2017   Depression with anxiety 03/16/2017   Subcutaneous nodules 03/16/2017   Aortic atherosclerosis ([place]) 11/24/2016   Allergy to multiple antibiotics 10/07/2016   Seafood allergy, anaphylaxis, subsequent encounter 60/73/7106   Helicobacter pylori gastritis 10/07/2016    Fall 09/05/2016   Bloating 08/15/2016   Severe recurrent major depression without psychotic features ([place]) 04/13/2016    Class: Chronic   Rash and nonspecific skin eruption 03/01/2016   Leg pain, bilateral 03/01/2016   Functional dyspepsia 10/13/2015   Subacromial bursitis 03/02/2015   Resting tremor 02/01/2015   Epigastric fullness 10/12/2014   Early satiety 10/12/2014   Cystocele 12/30/2013   IBS (irritable bowel syndrome) 09/30/2013   GERD (gastroesophageal reflux disease) 06/27/2013   PVC's (premature ventricular contractions) 10/02/2012   Depressive disorder, not elsewhere classified 10/02/2012   Bradycardia 08/01/2012   Fatigue 08/01/2012   Anxiety state 05/13/2012   Osteopenia 05/13/2012   Hypothyroidism 05/13/2012    Past Surgical History:  Procedure Laterality Date   ABDOMINAL HYSTERECTOMY     CATARACT EXTRACTION, BILATERAL  1995, 1996   Flexible sigmoidoscopy     THYROIDECTOMY, PARTIAL  09/2005   L nodule; Dr. Harlow Asa   TONSILLECTOMY  1946   UPPER GI ENDOSCOPY  06/27/12   VAGINAL HYSTERECTOMY  1971   and bladder repair.  Still has ovaries     OB History    Gravida  2   Para  2   Term      Preterm      AB      Living  2     SAB      TAB      Ectopic      Multiple      Live Births               Home Medications    Prior to Admission medications   Medication Sig Start Date End Date Taking? Authorizing Provider  ALPRAZolam (XANAX) 0.5 MG tablet TAKE BY MOUTH UP TO 3 TIMES DAILY. WARNING: BENZODIAZEPINES INCREASE THE RISK OF FALLS AND NEUROCOGNITIVE DECLINE IN THE ELDERLY Patient taking differently: Take 0.5 mg by mouth 4 (four) times daily as needed for anxiety.  02/18/18  Yes Eksir, Richard Miu, MD  b complex vitamins capsule Take 1 capsule by mouth daily.    Yes [provider]  Carbidopa-Levodopa ER (SINEMET CR) 25-100 MG tablet controlled release Take 1 tablet by mouth 4 (four) times daily. Patient  taking differently: Take 1 tablet by mouth 3 (three) times daily.  10/24/18  Yes Tat, Eustace Quail, DO  Cholecalciferol (VITAMIN D) 2000 UNITS tablet Take 2,000 Units by mouth daily.   Yes [provider]  Homeopathic Products (AZO CONFIDENCE PO) Take 1 tablet by mouth once.   Yes [provider]  Probiotic Product (ALIGN PO) Take 1 capsule by mouth daily.    Yes [provider]  selegiline (ELDEPRYL) 5 MG capsule Take 5 mg by mouth daily.   Yes [provider]  SYNTHROID 25 MCG tablet TAKE 1 TABLET DAILY BEFORE BREAKFAST Patient taking differently: Take 25 mcg by mouth daily before breakfast.  09/30/18  Yes [name], MD  vitamin E 400 UNIT capsule Take 400 Units by mouth every Monday, Wednesday, and Friday.  Reported on 09/06/2015   Yes [provider]    Family History Family History  Problem Relation Age of Onset   Heart disease Mother    Hypertension Mother    Hypertension Sister    HIV Son    Heart disease Brother    Lung cancer Brother        lung   Diabetes Maternal Grandfather    Diabetes Granddaughter        type 1    Social History Social History   Tobacco Use   Smoking status: Never Smoker   Smokeless tobacco: Never Used  Substance Use Topics   Alcohol use: No    Alcohol/week: 0.0 standard drinks   Drug use: No     Allergies   Iodine; Levsin [hyoscyamine sulfate]; Hyman Hopes allergy]; Shellfish allergy; Remeron [mirtazapine]; Aspirin; Ciprofloxacin; Codeine; Darvocet [propoxyphene n-acetaminophen]; Demerol [meperidine]; Dexilant [dexlansoprazole]; Diphedryl [diphenhydramine]; Doxycycline hyclate; Epinephrine; Erythromycin; Flexeril [cyclobenzaprine]; Keflex [cephalexin]; Latex; Nitrofurantoin; Prednisone; Pylera [bis subcit-metronid-tetracyc]; Sulfa antibiotics; Xylocaine [lidocaine hcl]; Zoloft [sertraline hcl]; Advil [ibuprofen]; and Clarithromycin   Review of Systems Review of Systems  Constitutional:  Negative for activity change, chills and fever.  HENT: Negative for congestion.   Respiratory: Negative for cough, shortness of breath and wheezing.   Cardiovascular: Negative for chest pain, palpitations and leg swelling.  Gastrointestinal: Positive for abdominal pain, blood in stool, constipation, diarrhea and nausea. Negative for abdominal distention, anal bleeding and vomiting.  Genitourinary: Negative for dysuria and hematuria.  Musculoskeletal: Negative for back pain, myalgias, neck pain and neck stiffness.  Skin: Negative for rash.  Allergic/Immunologic: Negative for immunocompromised state.  Neurological: Positive for weakness (generalized). Negative for dizziness, syncope, numbness and headaches.  Psychiatric/Behavioral: Negative for confusion.   Physical Exam Updated Vital Signs BP (!) 117/51    Pulse 72    Temp 98.3 F (36.8 C) (Oral)    Resp 17    SpO2 95%   Physical Exam Vitals signs and nursing note reviewed.  Constitutional:      General: She is not in acute distress.    Appearance: She is not toxic-appearing or diaphoretic.  HENT:     Head: Normocephalic.  Eyes:     Conjunctiva/sclera: Conjunctivae normal.  Neck:     Musculoskeletal: Normal range of motion and neck supple.  Cardiovascular:     Rate and Rhythm: Normal rate and regular rhythm.     Heart sounds: No murmur. No friction rub. No gallop.   Pulmonary:     Effort: Pulmonary effort is normal. No respiratory distress.     Breath sounds: No stridor. No wheezing, rhonchi or rales.  Chest:     Chest wall: No tenderness.  Abdominal:     General: There is no distension.     Palpations: Abdomen is soft. There is no mass.     Tenderness: There is abdominal tenderness. There is no right CVA tenderness, left CVA tenderness or rebound.     Hernia: No hernia is present.     Comments: Tender to palpation in the left lower quadrant and suprapubic regions.  She has no guarding, but grimaces with palpation.  No  rebound.  Upper abdomen is nontender.  Abdomen is soft, nondistended.  No CVA tenderness bilaterally.   Musculoskeletal:     Right lower leg: No edema.     Left lower leg: No edema.  Skin:    General: Skin is warm.     Capillary Refill: Capillary refill takes less than 2 seconds.  Findings: No rash.  Neurological:     Mental Status: She is alert.     Comments: Resting tremor in the right hand. Left hand is not tremulous. Patient reports this is baseline.   Psychiatric:        Behavior: Behavior normal.      ED Treatments / Results  Labs (all labs ordered are listed, but only abnormal results are displayed) Labs Reviewed  CBC WITH DIFFERENTIAL/PLATELET - Abnormal; Notable for the following components:      Result Value   WBC 20.8 (*)    HCT 47.4 (*)    Neutro Abs 18.8 (*)    Abs Immature Granulocytes 0.20 (*)    All other components within normal limits  COMPREHENSIVE METABOLIC PANEL - Abnormal; Notable for the following components:   Glucose, Bld 146 (*)    All other components within normal limits  URINALYSIS, ROUTINE W REFLEX MICROSCOPIC - Abnormal; Notable for the following components:   APPearance CLOUDY (*)    Ketones, ur 5 (*)    Nitrite POSITIVE (*)    Leukocytes,Ua LARGE (*)    Bacteria, UA MANY (*)    All other components within normal limits  URINE CULTURE  LIPASE, BLOOD  POC OCCULT BLOOD, ED    EKG None  Radiology Ct Abdomen Pelvis Wo Contrast  Result Date: 11/22/2018 CLINICAL DATA:  Initial evaluation for acute abdominal pain. Diverticulitis suspected. EXAM: CT ABDOMEN AND PELVIS WITHOUT CONTRAST TECHNIQUE: Multidetector CT imaging of the abdomen and pelvis was performed following the standard protocol without IV contrast. COMPARISON:  Prior CT from 09/18/2014. FINDINGS: Lower chest: Scattered subsegmental atelectatic changes and/or fibrosis noted within the visualized lung bases. 7 mm pleural base nodular density at the posterior right lung base (series  4, image 34), indeterminate. Visualized lungs are otherwise clear. Hepatobiliary: Limited noncontrast evaluation of the liver is unremarkable. Gallbladder normal. No biliary dilatation. Pancreas: Pancreas within normal limits. Spleen: Spleen within normal limits. Adrenals/Urinary Tract: Adrenal glands are normal. Kidneys equal in size without evidence for nephrolithiasis or hydronephrosis. No radiopaque calculi seen along the course of either renal collecting system. No hydroureter. Partially distended bladder within normal limits. Stomach/Bowel: Stomach mildly distended with enteric contrast material within the gastric lumen. No evidence for bowel obstruction. Normal appendix. Colon largely decompressed. Mild circumferential wall thickening about the transverse colon, which could be related incomplete distension or possibly mild colitis. No other acute inflammatory changes about the bowels. Vascular/Lymphatic: Mild aorto bi-iliac atherosclerotic disease. No aneurysm. No adenopathy. Reproductive: Uterus is absent. Atrophic right ovary. 3.7 cm simple cyst present within the left ovary. Other: No free air or fluid. Musculoskeletal: No acute osseous finding. No discrete lytic or blastic osseous lesions. IMPRESSION: 1. Mild circumferential wall thickening about the transverse colon, which may be related incomplete distension or possibly mild colitis. 2. No other acute intra-abdominal or pelvic process. 3. 3.7 cm simple left ovarian cyst. Follow-up examination with dedicated pelvic ultrasound recommended for complete evaluation. 4. 7 mm pleural-based nodular density at the posterior right lung base, indeterminate. Non-contrast chest CT at 6-12 months is recommended. If the nodule is stable at time of repeat CT, then future CT at 18-24 months (from today's scan) is considered optional for low-risk patients, but is recommended for high-risk patients. This recommendation follows the consensus statement: Guidelines for  Management of Incidental Pulmonary Nodules Detected on CT Images: From the Fleischner Society 2017; Radiology 2017; 284:228-243. Electronically Signed   By: Jeannine Boga M.D.   On: 11/22/2018 02:17  Procedures Procedures (including critical care time)  Medications Ordered in ED Medications  sodium chloride 0.9 % bolus 500 mL (has no administration in time range)  piperacillin-tazobactam (ZOSYN) IVPB 3.375 g (has no administration in time range)  acetaminophen (TYLENOL) tablet 1,000 mg (has no administration in time range)  fentaNYL (SUBLIMAZE) injection 50 mcg (has no administration in time range)  fentaNYL (SUBLIMAZE) injection 50 mcg (50 mcg Intravenous Given 11/21/18 2303)  ondansetron (ZOFRAN) injection 4 mg (4 mg Intravenous Given 11/21/18 2303)  fentaNYL (SUBLIMAZE) injection 50 mcg (50 mcg Intravenous Given 11/22/18 0147)     Initial Impression / Assessment and Plan / ED Course  I have reviewed the triage vital signs and the nursing notes.  Pertinent labs & imaging results that were available during my care of the patient were reviewed by me and considered in my medical decision making (see chart for details).        85 year old female with a history of Parkinson's disease, H. pylori gastritis, aortic atherosclerosis, GERD, IBS, and anxiety presenting with rectal bleeding.  She reports that she was feeling clammy, generally weak, and is having abdominal pain along with rectal bleeding earlier tonight.  She was treated with antibiotics for UTI approximately 5 weeks ago.  She took 2 days of Macrobid, but was not tolerating the medication and there was changed to ciprofloxacin for a course from 3/13-24/20.  The patient was discussed and independently evaluated by Dr. Clydell Hakim, attending physician.  Vital signs are reassuring.  She is afebrile and without tachycardia.  On exam, she is tender to palpation in the suprapubic region and left lower quadrant.  She has no guarding  or rebound, but does have some grimacing with palpation.  We had a lengthy decision making conversation regarding pain control given the patient's many extensive allergies.  I recommended Bentyl for cramping pain, but patient was very hesitant to try any medication at this time.  She was agreeable to fentanyl and on reevaluation reports good pain control with IV fentanyl.  She was given Zofran for nausea.  Labs are notable for leukocytosis of 20.8 with an elevated absolute neutrophil count.  Metabolic panel is unremarkable.  Urinalysis is concerning for infection.  Urine culture sent.  She does not meet sepsis criteria.  CT with mild circumferential wall thickening concerning for colitis.  She also has a 3.7 cm left ovarian cyst that may benefit from follow-up examination with a dedicated pelvic ultrasound for further evaluation.   Per chart review, in 2018, patient underwent penicillin sensitization.  She previously refused amoxicillin.  She reported edema with Flagyl. Given the patient's many allergies to antibiotics, pharmacy was consulted and spoke with [facility] who recommended Zosyn for both colitis and UTI coverage.   She had a urine culture from December 2018 that grew Proteus that demonstrated resistance to ampicillin, cefazolin, and intermediate resistance to ampicillin sulbactam.  Zosyn was sensitive.  On reevaluation, discussed CT findings with patient.  I have also ordered a 500 cc fluid bolus as patient previously had an echo that demonstrated an EF of 50 to 55%.  She has had no further episodes of bleeding or BM since arrival in the ER.  She is requesting something for sleep, but also reports that her cramping pain is back.  Reports that she typically takes 1.5 milligrams of Xanax at night to help with sleeping.  Discussed with the patient that I do not feel comfortable administering both Xanax and fentanyl.  Will give fentanyl and Tylenol and  consider giving a small dose of Benadryl (as the patient  reports tachycardia with higher doses) if she continues to have insomnia.  She is requested I call and give her husband an update.  I have attempted to call the patient's home phone 4-5 times and received a busy signal each time.   Given concern for the patient's age and extensive medical history and concern for decompensation, the hospitalist team was consulted.  Dr. Hal Hope has accepted the patient for admission. The patient appears reasonably stabilized for admission considering the current resources, flow, and capabilities available in the ED at this time, and I doubt any other [facility] requiring further screening and/or treatment in the ED prior to admission.    Final Clinical Impressions(s) / ED Diagnoses   Final diagnoses:  Colitis  Acute cystitis without hematuria    ED Discharge Orders    None       Joanne Gavel, PA-C 11/22/18 [place], April, MD 11/22/18 2800

## 2018-11-21 NOTE — ED Notes (Signed)
Bed: WA21 Expected date:  Expected time:  Means of arrival:  Comments: 65F abdominal pain

## 2018-11-22 ENCOUNTER — Emergency Department (HOSPITAL_COMMUNITY): Payer: Medicare Other

## 2018-11-22 ENCOUNTER — Encounter (HOSPITAL_COMMUNITY): Payer: Self-pay

## 2018-11-22 ENCOUNTER — Telehealth: Payer: Self-pay | Admitting: Gastroenterology

## 2018-11-22 ENCOUNTER — Observation Stay (HOSPITAL_COMMUNITY): Payer: Medicare Other

## 2018-11-22 DIAGNOSIS — R109 Unspecified abdominal pain: Secondary | ICD-10-CM

## 2018-11-22 DIAGNOSIS — K581 Irritable bowel syndrome with constipation: Secondary | ICD-10-CM | POA: Diagnosis present

## 2018-11-22 DIAGNOSIS — R911 Solitary pulmonary nodule: Secondary | ICD-10-CM | POA: Diagnosis present

## 2018-11-22 DIAGNOSIS — N390 Urinary tract infection, site not specified: Secondary | ICD-10-CM

## 2018-11-22 DIAGNOSIS — M797 Fibromyalgia: Secondary | ICD-10-CM | POA: Diagnosis present

## 2018-11-22 DIAGNOSIS — Z885 Allergy status to narcotic agent status: Secondary | ICD-10-CM | POA: Diagnosis not present

## 2018-11-22 DIAGNOSIS — R1032 Left lower quadrant pain: Secondary | ICD-10-CM | POA: Diagnosis not present

## 2018-11-22 DIAGNOSIS — I493 Ventricular premature depolarization: Secondary | ICD-10-CM | POA: Diagnosis present

## 2018-11-22 DIAGNOSIS — N3000 Acute cystitis without hematuria: Secondary | ICD-10-CM | POA: Diagnosis not present

## 2018-11-22 DIAGNOSIS — E890 Postprocedural hypothyroidism: Secondary | ICD-10-CM | POA: Diagnosis present

## 2018-11-22 DIAGNOSIS — Z1159 Encounter for screening for other viral diseases: Secondary | ICD-10-CM | POA: Diagnosis not present

## 2018-11-22 DIAGNOSIS — Z91040 Latex allergy status: Secondary | ICD-10-CM | POA: Diagnosis not present

## 2018-11-22 DIAGNOSIS — K559 Vascular disorder of intestine, unspecified: Secondary | ICD-10-CM | POA: Diagnosis not present

## 2018-11-22 DIAGNOSIS — Z9842 Cataract extraction status, left eye: Secondary | ICD-10-CM | POA: Diagnosis not present

## 2018-11-22 DIAGNOSIS — F40240 Claustrophobia: Secondary | ICD-10-CM | POA: Diagnosis present

## 2018-11-22 DIAGNOSIS — K6389 Other specified diseases of intestine: Secondary | ICD-10-CM | POA: Diagnosis not present

## 2018-11-22 DIAGNOSIS — K529 Noninfective gastroenteritis and colitis, unspecified: Secondary | ICD-10-CM | POA: Insufficient documentation

## 2018-11-22 DIAGNOSIS — N3001 Acute cystitis with hematuria: Secondary | ICD-10-CM | POA: Insufficient documentation

## 2018-11-22 DIAGNOSIS — Z91048 Other nonmedicinal substance allergy status: Secondary | ICD-10-CM | POA: Diagnosis not present

## 2018-11-22 DIAGNOSIS — Z90710 Acquired absence of both cervix and uterus: Secondary | ICD-10-CM | POA: Diagnosis not present

## 2018-11-22 DIAGNOSIS — Z881 Allergy status to other antibiotic agents status: Secondary | ICD-10-CM | POA: Diagnosis not present

## 2018-11-22 DIAGNOSIS — D72829 Elevated white blood cell count, unspecified: Secondary | ICD-10-CM | POA: Diagnosis not present

## 2018-11-22 DIAGNOSIS — M810 Age-related osteoporosis without current pathological fracture: Secondary | ICD-10-CM | POA: Diagnosis present

## 2018-11-22 DIAGNOSIS — N3090 Cystitis, unspecified without hematuria: Secondary | ICD-10-CM | POA: Insufficient documentation

## 2018-11-22 DIAGNOSIS — Z886 Allergy status to analgesic agent status: Secondary | ICD-10-CM | POA: Diagnosis not present

## 2018-11-22 DIAGNOSIS — E876 Hypokalemia: Secondary | ICD-10-CM | POA: Diagnosis present

## 2018-11-22 DIAGNOSIS — Z9841 Cataract extraction status, right eye: Secondary | ICD-10-CM | POA: Diagnosis not present

## 2018-11-22 DIAGNOSIS — K219 Gastro-esophageal reflux disease without esophagitis: Secondary | ICD-10-CM | POA: Diagnosis present

## 2018-11-22 DIAGNOSIS — N83292 Other ovarian cyst, left side: Secondary | ICD-10-CM | POA: Diagnosis not present

## 2018-11-22 DIAGNOSIS — Z91013 Allergy to seafood: Secondary | ICD-10-CM | POA: Diagnosis not present

## 2018-11-22 DIAGNOSIS — Z8711 Personal history of peptic ulcer disease: Secondary | ICD-10-CM | POA: Diagnosis not present

## 2018-11-22 DIAGNOSIS — Z87440 Personal history of urinary (tract) infections: Secondary | ICD-10-CM | POA: Diagnosis not present

## 2018-11-22 DIAGNOSIS — F410 Panic disorder [episodic paroxysmal anxiety] without agoraphobia: Secondary | ICD-10-CM | POA: Diagnosis present

## 2018-11-22 DIAGNOSIS — E039 Hypothyroidism, unspecified: Secondary | ICD-10-CM | POA: Diagnosis not present

## 2018-11-22 DIAGNOSIS — K55039 Acute (reversible) ischemia of large intestine, extent unspecified: Secondary | ICD-10-CM | POA: Diagnosis present

## 2018-11-22 DIAGNOSIS — B961 Klebsiella pneumoniae [K. pneumoniae] as the cause of diseases classified elsewhere: Secondary | ICD-10-CM | POA: Diagnosis present

## 2018-11-22 DIAGNOSIS — Z880 Allergy status to penicillin: Secondary | ICD-10-CM | POA: Diagnosis not present

## 2018-11-22 DIAGNOSIS — Z884 Allergy status to anesthetic agent status: Secondary | ICD-10-CM | POA: Diagnosis not present

## 2018-11-22 DIAGNOSIS — G20 Parkinson's disease: Secondary | ICD-10-CM | POA: Diagnosis not present

## 2018-11-22 DIAGNOSIS — E872 Acidosis: Secondary | ICD-10-CM | POA: Diagnosis present

## 2018-11-22 DIAGNOSIS — Z8672 Personal history of thrombophlebitis: Secondary | ICD-10-CM | POA: Diagnosis not present

## 2018-11-22 DIAGNOSIS — Z888 Allergy status to other drugs, medicaments and biological substances status: Secondary | ICD-10-CM | POA: Diagnosis not present

## 2018-11-22 HISTORY — DX: Urinary tract infection, site not specified: N39.0

## 2018-11-22 LAB — CBC
HCT: 46.8 % — ABNORMAL HIGH (ref 36.0–46.0)
HCT: 45.4 % (ref 36.0–46.0)
HCT: 45.8 % (ref 36.0–46.0)
Hemoglobin: 14.8 g/dL (ref 12.0–15.0)
Hemoglobin: 13.9 g/dL (ref 12.0–15.0)
Hemoglobin: 14.3 g/dL (ref 12.0–15.0)
MCH: 30.6 pg (ref 26.0–34.0)
MCH: 30.1 pg (ref 26.0–34.0)
MCH: 30.8 pg (ref 26.0–34.0)
MCHC: 31.6 g/dL (ref 30.0–36.0)
MCHC: 30.6 g/dL (ref 30.0–36.0)
MCHC: 31.2 g/dL (ref 30.0–36.0)
MCV: 96.9 fL (ref 80.0–100.0)
MCV: 98.3 fL (ref 80.0–100.0)
MCV: 98.5 fL (ref 80.0–100.0)
Platelets: 257 10*3/uL (ref 150–400)
Platelets: 291 10*3/uL (ref 150–400)
Platelets: 252 10*3/uL (ref 150–400)
RBC: 4.83 MIL/uL (ref 3.87–5.11)
RBC: 4.62 MIL/uL (ref 3.87–5.11)
RBC: 4.65 MIL/uL (ref 3.87–5.11)
RDW: 13.1 % (ref 11.5–15.5)
RDW: 13.1 % (ref 11.5–15.5)
RDW: 13.2 % (ref 11.5–15.5)
WBC: 16.5 10*3/uL — ABNORMAL HIGH (ref 4.0–10.5)
WBC: 15.2 10*3/uL — ABNORMAL HIGH (ref 4.0–10.5)
WBC: 16.2 10*3/uL — ABNORMAL HIGH (ref 4.0–10.5)
nRBC: 0 % (ref 0.0–0.2)
nRBC: 0 % (ref 0.0–0.2)
nRBC: 0 % (ref 0.0–0.2)

## 2018-11-22 LAB — URINALYSIS, ROUTINE W REFLEX MICROSCOPIC
Bilirubin Urine: NEGATIVE
Glucose, UA: NEGATIVE mg/dL
Hgb urine dipstick: NEGATIVE
Ketones, ur: 5 mg/dL — AB
Nitrite: POSITIVE — AB
Protein, ur: NEGATIVE mg/dL
Specific Gravity, Urine: 1.017 (ref 1.005–1.030)
pH: 5 (ref 5.0–8.0)

## 2018-11-22 LAB — TYPE AND SCREEN
ABO/RH(D): B POS
Antibody Screen: NEGATIVE

## 2018-11-22 LAB — LACTIC ACID, PLASMA
Lactic Acid, Venous: 2.1 mmol/L (ref 0.5–1.9)
Lactic Acid, Venous: 2.3 mmol/L (ref 0.5–1.9)
Lactic Acid, Venous: 2.7 mmol/L (ref 0.5–1.9)
Lactic Acid, Venous: 2.4 mmol/L (ref 0.5–1.9)

## 2018-11-22 LAB — COMPREHENSIVE METABOLIC PANEL
ALT: <5 U/L (ref 0–44)
AST: 22 U/L (ref 15–41)
Albumin: 4 g/dL (ref 3.5–5.0)
Alkaline Phosphatase: 77 U/L (ref 38–126)
Anion gap: 9 (ref 5–15)
BUN: 15 mg/dL (ref 8–23)
CO2: 25 mmol/L (ref 22–32)
Calcium: 9.2 mg/dL (ref 8.9–10.3)
Chloride: 105 mmol/L (ref 98–111)
Creatinine, Ser: 0.7 mg/dL (ref 0.44–1.00)
GFR calc Af Amer: >60 mL/min (ref >60)
GFR calc non Af Amer: >60 mL/min (ref >60)
Glucose, Bld: 146 mg/dL — ABNORMAL HIGH (ref 70–99)
Potassium: 3.7 mmol/L (ref 3.5–5.1)
Sodium: 139 mmol/L (ref 135–145)
Total Bilirubin: 0.9 mg/dL (ref 0.3–1.2)
Total Protein: 7.4 g/dL (ref 6.5–8.1)

## 2018-11-22 LAB — POC OCCULT BLOOD, ED: Fecal Occult Bld: POSITIVE — AB

## 2018-11-22 LAB — ABO/RH: ABO/RH(D): B POS

## 2018-11-22 LAB — LIPASE, BLOOD: Lipase: 44 U/L (ref 11–51)

## 2018-11-22 MED ORDER — ONDANSETRON HCL 4 MG PO TABS: 4.0000 mg | ORAL_TABLET | Freq: Four times a day (QID) | ORAL | Status: DC | PRN

## 2018-11-22 MED ORDER — ACETAMINOPHEN 500 MG PO TABS
1000.0000 mg | ORAL_TABLET | Freq: Once | ORAL | Status: AC
  Administered 2018-11-22: 1000 mg via ORAL
  Filled 2018-11-22: qty 2

## 2018-11-22 MED ORDER — FENTANYL CITRATE (PF) 100 MCG/2ML IJ SOLN
50.0000 ug | Freq: Once | INTRAMUSCULAR | Status: AC
  Administered 2018-11-22: 50 ug via INTRAVENOUS
  Filled 2018-11-22: qty 2

## 2018-11-22 MED ORDER — LEVOTHYROXINE SODIUM 25 MCG PO TABS
25.0000 ug | ORAL_TABLET | Freq: Every day | ORAL | Status: DC
  Administered 2018-11-22 – 2018-11-25 (×4): 25 ug via ORAL
  Filled 2018-11-22 (×4): qty 1

## 2018-11-22 MED ORDER — PIPERACILLIN-TAZOBACTAM 3.375 G IVPB
3.3750 g | Freq: Three times a day (TID) | INTRAVENOUS | Status: DC
  Administered 2018-11-22 – 2018-11-24 (×7): 3.375 g via INTRAVENOUS
  Filled 2018-11-22 (×6): qty 50

## 2018-11-22 MED ORDER — ALPRAZOLAM 0.5 MG PO TABS: 1.5000 mg | ORAL_TABLET | Freq: Once | ORAL | Status: DC

## 2018-11-22 MED ORDER — ALPRAZOLAM 0.5 MG PO TABS
0.5000 mg | ORAL_TABLET | Freq: Four times a day (QID) | ORAL | Status: DC | PRN
  Administered 2018-11-22 – 2018-11-24 (×9): 0.5 mg via ORAL
  Filled 2018-11-22 (×9): qty 1

## 2018-11-22 MED ORDER — ACETAMINOPHEN 650 MG RE SUPP: 650.0000 mg | Freq: Four times a day (QID) | RECTAL | Status: DC | PRN

## 2018-11-22 MED ORDER — ONDANSETRON HCL 4 MG/2ML IJ SOLN: 4.0000 mg | Freq: Four times a day (QID) | INTRAMUSCULAR | Status: DC | PRN

## 2018-11-22 MED ORDER — PIPERACILLIN-TAZOBACTAM 3.375 G IVPB
3.3750 g | Freq: Once | INTRAVENOUS | Status: AC
  Administered 2018-11-22: 3.375 g via INTRAVENOUS
  Filled 2018-11-22: qty 50

## 2018-11-22 MED ORDER — SODIUM CHLORIDE 0.9 % IV BOLUS
500.0000 mL | Freq: Once | INTRAVENOUS | Status: AC
  Administered 2018-11-22: 500 mL via INTRAVENOUS

## 2018-11-22 MED ORDER — ACETAMINOPHEN 325 MG PO TABS: 650.0000 mg | ORAL_TABLET | Freq: Four times a day (QID) | ORAL | Status: DC | PRN

## 2018-11-22 MED ORDER — SELEGILINE HCL 5 MG PO TABS
5.0000 mg | ORAL_TABLET | Freq: Every day | ORAL | Status: DC
  Administered 2018-11-22 – 2018-11-25 (×4): 5 mg via ORAL
  Filled 2018-11-22 (×4): qty 1

## 2018-11-22 MED ORDER — FENTANYL CITRATE (PF) 100 MCG/2ML IJ SOLN
25.0000 ug | INTRAMUSCULAR | Status: DC | PRN
  Administered 2018-11-22 – 2018-11-25 (×15): 25 ug via INTRAVENOUS
  Filled 2018-11-22 (×15): qty 2

## 2018-11-22 MED ORDER — SODIUM CHLORIDE 0.9 % IV SOLN
INTRAVENOUS | Status: AC
  Administered 2018-11-22 (×2): via INTRAVENOUS

## 2018-11-22 MED ORDER — CARBIDOPA-LEVODOPA ER 25-100 MG PO TBCR
1.0000 | EXTENDED_RELEASE_TABLET | Freq: Three times a day (TID) | ORAL | Status: DC
  Administered 2018-11-22 – 2018-11-25 (×10): 1 via ORAL
  Filled 2018-11-22 (×11): qty 1

## 2018-11-22 NOTE — Progress Notes (Signed)
CRITICAL VALUE ALERT  Date & Time Notied: 11/22/2018 2030  Lactic acid 2.4  Provider Notified: X blount  Orders Received/Actions taken: no new orders yet will continue to monitor patient

## 2018-11-22 NOTE — Telephone Encounter (Signed)
FYI:  Pt's husband called to inform that pt is in the ED and that her symptoms has gotten worse.  Pt is scheduled for a virtual visit on Monday, 11/25/18.  Pt will be requesting a GI consult with Dr. Silverio Decamp today.

## 2018-11-22 NOTE — Progress Notes (Signed)
A consult was received from an ED physician for zosyn per pharmacy dosing.  The patient's profile has been reviewed for ht/wt/allergies/indication/available labs.   A one time order has been placed for Zosyn 3.375 Gm x1 over 4 hours.  Further antibiotics/pharmacy consults should be ordered by admitting physician if indicated.        Re: Patient with multiple medication allergies:   Discussed allergies with Donavan Foil, PA.  Per old epic records seems patient has tolerated amoxicillin in the past.  Will proceed with zosyn and run it slowly over 4 hours and have RN observe for allergic rxn.                  Thank you, Dorrene German 11/22/2018  2:46 AM

## 2018-11-22 NOTE — Progress Notes (Addendum)
PROGRESS NOTE    Tracy Malone  PYK:[id number] DOB: [birth year] DOA: 11/21/2018 PCP: [name], MD (Confirm with patient/family/NH records and if not entered, this HAS to be entered at [facility] point of entry. "No PCP" if truly none.)   Brief Narrative:  Tracy Malone is a 85 y.o. female with history of Parkinson's disease and hypothyroidism has been experiencing suprapubic and left lower quadrant pain for last 3 to 4 weeks.  Denied any nausea vomiting.  Has chronic constipation.  Patient also had visited OB/GYN about 2 weeks ago and was told that her pain is likely related to the colon and not related to bladder or uterine.  Patient was referred to Dr. Ardis Hughs gastroenterologist which patient is here to follow.  Patient also had followed up with hematologist because of weakness. Yesterday patient had increasing pain and also for the first time she noticed some blood in the stools so she came to ER.  In the ER patient was afebrile and hemodynamically stable.  CT scan of the abdomen and pelvis done showed colon thickening around the transverse colon concerning for colitis and UA is consistent with UTI.  CT abdomen also showed left simple ovarian cyst which was verified by pelvic ultrasound. Patient was started on empiric antibiotics IV fluids and admitted under hospitalist service.  Consultants:   GI  Procedures:   None  Antimicrobials:   Zosyn started on 11/21/2018   Subjective: Patient seen and examined.  She states that she continues to have lower abdominal pain which is crampy 9 out of 10 with no aggravating or relieving factor.  Does not have any other complaint.  Denies any shortness of breath or fever or chills.  When I entered the room, patient started telling me all the long history about her abdominal pain at her visits to oncologist and OB/GYN.  She told me that her husband has already called the gastroenterologist/Dr. Ardis Hughs office and that his APP will see patient today.  She tells  me that I do not need to call them or consult them.  Objective: Vitals:   11/22/18 0345 11/22/18 0430 11/22/18 0510 11/22/18 0513  BP: (!) 121/52 133/67  132/68  Pulse: 80 86  73  Resp: 13 20  18   Temp:    98.2 F (36.8 C)  TempSrc:    Oral  SpO2: 100% 100%  91%  Weight:   49.6 kg   Height:   4\' 11"  (1.499 m)     Intake/Output Summary (Last 24 hours) at 11/22/2018 1249 Last data filed at 11/22/2018 0927 Gross per 24 hour  Intake 752.91 ml  Output -  Net 752.91 ml   Filed Weights   11/22/18 0331 11/22/18 0510  Weight: 48.5 kg 49.6 kg    Examination:  General exam: Appears comfortable but anxious. Respiratory system: Clear to auscultation. Respiratory effort normal. Cardiovascular system: S1 & S2 heard, RRR. No JVD, murmurs, rubs, gallops or clicks. No pedal edema. Gastrointestinal system: Abdomen is nondistended, generalized tenderness with more pronounced in the lower abdomen. No organomegaly or masses felt. Normal bowel sounds heard. Central nervous system: Alert and oriented. No focal neurological deficits. Extremities: Symmetric 5 x 5 power.  Pill-rolling movements of right upper extremity Skin: No rashes, lesions or ulcers Psychiatry: Judgement and insight appear normal. Mood & affect appropriate.    Data Reviewed: I have personally reviewed following labs and imaging studies  CBC: Recent Labs  Lab 11/21/18 2300 11/22/18 0447 11/22/18 0722 11/22/18 1126  WBC 20.8*  16.5* 15.2* 16.2*  NEUTROABS 18.8*  --   --   --   HGB 14.7 14.8 13.9 14.3  HCT 47.4* 46.8* 45.4 45.8  MCV 97.7 96.9 98.3 98.5  PLT 288 257 291 381   Basic Metabolic Panel: Recent Labs  Lab 11/21/18 2300  NA 139  K 3.7  CL 105  CO2 25  GLUCOSE 146*  BUN 15  CREATININE 0.70  CALCIUM 9.2   GFR: Estimated Creatinine Clearance: 35.1 mL/min (by C-G formula based on SCr of 0.7 mg/dL). Liver Function Tests: Recent Labs  Lab 11/21/18 2300  AST 22  ALT <5  ALKPHOS 77  BILITOT 0.9  PROT  7.4  ALBUMIN 4.0   Recent Labs  Lab 11/21/18 2300  LIPASE 44   No results for input(s): AMMONIA in the last 168 hours. Coagulation Profile: No results for input(s): INR, PROTIME in the last 168 hours. Cardiac Enzymes: No results for input(s): CKTOTAL, CKMB, CKMBINDEX, TROPONINI in the last 168 hours. BNP (last 3 results) No results for input(s): PROBNP in the last 8760 hours. HbA1C: No results for input(s): HGBA1C in the last 72 hours. CBG: No results for input(s): GLUCAP in the last 168 hours. Lipid Profile: No results for input(s): CHOL, HDL, LDLCALC, TRIG, CHOLHDL, LDLDIRECT in the last 72 hours. Thyroid Function Tests: No results for input(s): TSH, T4TOTAL, FREET4, T3FREE, THYROIDAB in the last 72 hours. Anemia Panel: No results for input(s): VITAMINB12, FOLATE, FERRITIN, TIBC, IRON, RETICCTPCT in the last 72 hours. Sepsis Labs: Recent Labs  Lab 11/22/18 0448 11/22/18 0175  LATICACIDVEN 2.1* 2.3*    No results found for this or any previous visit (from the past 240 hour(s)).    Radiology Studies: Ct Abdomen Pelvis Wo Contrast  Result Date: 11/22/2018 CLINICAL DATA:  Initial evaluation for acute abdominal pain. Diverticulitis suspected. EXAM: CT ABDOMEN AND PELVIS WITHOUT CONTRAST TECHNIQUE: Multidetector CT imaging of the abdomen and pelvis was performed following the standard protocol without IV contrast. COMPARISON:  Prior CT from 09/18/2014. FINDINGS: Lower chest: Scattered subsegmental atelectatic changes and/or fibrosis noted within the visualized lung bases. 7 mm pleural base nodular density at the posterior right lung base (series 4, image 34), indeterminate. Visualized lungs are otherwise clear. Hepatobiliary: Limited noncontrast evaluation of the liver is unremarkable. Gallbladder normal. No biliary dilatation. Pancreas: Pancreas within normal limits. Spleen: Spleen within normal limits. Adrenals/Urinary Tract: Adrenal glands are normal. Kidneys equal in size  without evidence for nephrolithiasis or hydronephrosis. No radiopaque calculi seen along the course of either renal collecting system. No hydroureter. Partially distended bladder within normal limits. Stomach/Bowel: Stomach mildly distended with enteric contrast material within the gastric lumen. No evidence for bowel obstruction. Normal appendix. Colon largely decompressed. Mild circumferential wall thickening about the transverse colon, which could be related incomplete distension or possibly mild colitis. No other acute inflammatory changes about the bowels. Vascular/Lymphatic: Mild aorto bi-iliac atherosclerotic disease. No aneurysm. No adenopathy. Reproductive: Uterus is absent. Atrophic right ovary. 3.7 cm simple cyst present within the left ovary. Other: No free air or fluid. Musculoskeletal: No acute osseous finding. No discrete lytic or blastic osseous lesions. IMPRESSION: 1. Mild circumferential wall thickening about the transverse colon, which may be related incomplete distension or possibly mild colitis. 2. No other acute intra-abdominal or pelvic process. 3. 3.7 cm simple left ovarian cyst. Follow-up examination with dedicated pelvic ultrasound recommended for complete evaluation. 4. 7 mm pleural-based nodular density at the posterior right lung base, indeterminate. Non-contrast chest CT at 6-12 months is recommended.  If the nodule is stable at time of repeat CT, then future CT at 18-24 months (from today's scan) is considered optional for low-risk patients, but is recommended for high-risk patients. This recommendation follows the consensus statement: Guidelines for Management of Incidental Pulmonary Nodules Detected on CT Images: From the Fleischner Society 2017; Radiology 2017; 284:228-243. Electronically Signed   By: Jeannine Boga M.D.   On: 11/22/2018 02:17   US Pelvis (transabdominal Only)  Result Date: 11/22/2018 CLINICAL DATA:  Left ovarian cysts seen on abdominal CT. Left lower  quadrant pain for 3-4 weeks EXAM: TRANSABDOMINAL ULTRASOUND OF PELVIS TECHNIQUE: Transabdominal ultrasound examination of the pelvis was performed including evaluation of the uterus, ovaries, adnexal regions, and pelvic cul-de-sac. COMPARISON:  Abdominal CT from earlier today FINDINGS: Uterus Surgically absent Right ovary Not visualized due to bowel.  No abnormality by CT. Left ovary Measurements: 4.6 x 4.5 x 3 cm = volume: 32 mL. There is an underlying 3.4 cm avascular anechoic cyst. Other findings:  No abnormal free fluid. IMPRESSION: 1. Simple 3.4 cm left ovarian cyst. One year follow-up pelvic ultrasound is recommended if appropriate for comorbidities. 2. The right ovary was not visualized sonographically but the right adnexa was normal by preceding CT. Electronically Signed   By: Monte Fantasia M.D.   On: 11/22/2018 08:11    Scheduled Meds: . Carbidopa-Levodopa ER  1 tablet Oral TID  . levothyroxine  25 mcg Oral QAC breakfast  . selegiline  5 mg Oral Daily   Continuous Infusions: . sodium chloride 150 mL/hr at 11/22/18 0952  . piperacillin-tazobactam (ZOSYN)  IV 3.375 g (11/22/18 1205)     LOS: 0 days   Assessment & Plan:   Principal Problem:   Acute colitis Active Problems:   Hypothyroidism   Parkinson's disease ([place])   Acute lower UTI   1. Transverse colitis : Patient has several allergies and for that reason, she was started on Zosyn which I will continue.  As mentioned above, patient tells me that her husband has already spoken to GI office and they will see her in consultation.  She believes that she needs a colonoscopy.  Slightly worsened leukocytosis.  Also has lactic acidosis.  Continue IV fluids.  Recheck lactic acid later.  Check blood culture.  2. Rectal bleeding -this is happened yesterday has not had any further episodes after admission.  Will check serial CBC.  Has had a sigmoidoscopy in 2013.  Which showed hemorrhoids.  Last upper endoscopy was in 2018.  As stated  above, she will be seen by GI and will defer further evaluation up to them.  3. Parkinson's disease on carbidopa levodopa and selegiline.  Continue those medications.  4. Hypothyroidism on Synthroid.  5. Anxiety on PRN Xanax.  6. Simple left ovarian cyst: Informed the patient about this diagnosis.  Will need repeat pelvic ultrasound in a year.  7. 8 mm pulmonary nodule: Home patient about this diagnosis as well.  Will need repeat CT scan in 12 months.  8. Acute UTI: Tinea Zosyn and follow urine culture and tailor antibiotics accordingly.   DVT prophylaxis: SCD Code Status: Full code Family Communication: Plan of care discussed with the patient.  Patient did not want me to call any family members.  She will call her husband to update. Disposition Plan: To be determined   Time spent: [place], MD [facility] Pager [phone]  If 7PM-7AM, please contact night-coverage [email or web address] Password [facility] 11/22/2018, 12:49 PM

## 2018-11-22 NOTE — ED Notes (Signed)
Patient put on 2L [place] due to o2 sat-80%.

## 2018-11-22 NOTE — Progress Notes (Signed)
CRITICAL VALUE ALERT  Critical Value:  Lactic 2.7  Date & Time Notied:  11/22/2018 1440  Provider Notified: Dr. Doristine Bosworth  Orders Received/Actions taken: Notified via text page.  Awaiting call back.

## 2018-11-22 NOTE — ED Notes (Signed)
ED TO INPATIENT HANDOFF REPORT  ED Nurse Name and Phone #: Loleta Books [phone]  S Name/Age/Gender [name] 85 y.o. female Room/Bed: WA21/WA21  Code Status   Code Status: Not on file  Home/SNF/Other Home Patient oriented to: self, place, time and situation Is this baseline? Yes   Triage Complete: Triage complete  Chief Complaint Abd pain  Triage Note Pt arrived via EMS from home. Pt c/o lower abdominal pain / cramping. Pt had be constipated and had a BM today and began having cramping afterwards.Pt denies taking mediation for pain.    EMS v/s 139/58, HR 70, 97% RA, RR 16, CBG 163  Husband [phone]   Allergies Allergies  Allergen Reactions  . Iodine Anaphylaxis    IV and topical forms.  Burnard Leigh [Hyoscyamine Sulfate]     Vision problems/pt has glaucoma  . Salmon [Fish Allergy] Hives and Shortness Of Breath  . Shellfish Allergy Anaphylaxis  . Remeron [Mirtazapine] Other (See Comments)    Cause blurred vision and red eyes, pt has glaucoma  . Aspirin Other (See Comments)    Sever stomach pain due to ulcer scaring.  . Ciprofloxacin Diarrhea  . Codeine Nausea And Vomiting  . Darvocet [Propoxyphene N-Acetaminophen] Nausea And Vomiting  . Demerol [Meperidine] Nausea Only  . Dexilant [Dexlansoprazole] Swelling    Redness, swelling and peeling of both feet.  . Diphedryl [Diphenhydramine] Other (See Comments)    Increased pulse/small amount ok  . Doxycycline Hyclate Other (See Comments)    GI intolerance.  Marland Kitchen Epinephrine Other (See Comments)    Breathing problems  . Erythromycin Other (See Comments)    GI intolerance.  Yvette Rack [Cyclobenzaprine] Other (See Comments)    Tingly/prickly sensation.  Marland Kitchen Keflex [Cephalexin] Hives  . Latex Other (See Comments)    Gloves ok.  Skin gets red from elastic in underwear and latex bandaides.  . Nitrofurantoin Diarrhea  . Prednisone Other (See Comments)    Headache  . Pylera [Bis Subcit-Metronid-Tetracyc]  Swelling    Tongue swelling. Face tingling  . Sulfa Antibiotics Other (See Comments)    Increased pulse, fainting, diarrhea, thrush  . Xylocaine [Lidocaine Hcl]     With epinephrine, given by dentist.  Speeded up heart rate and she passed out (occured twice, at dentist)  . Zoloft [Sertraline Hcl] Swelling and Other (See Comments)    Migraine Swelling of tongue/lip (09/2012)  . Advil [Ibuprofen] Other (See Comments)    Motrin ok with a GI effect.  . Clarithromycin Rash    Started after completing 10 day course of 2000 mg /day    Level of Care/Admitting Diagnosis ED Disposition    ED Disposition Condition [facility] Area: [place] [[id number]]  Level of Care: Telemetry [5]  Admit to tele based on following criteria: Monitor for Ischemic changes  Covid Evaluation: N/A  Diagnosis: Abdominal pain [009233]  Admitting Physician: Rise Patience [phone]  Attending Physician: Rise Patience [[id number]]  PT Class (Do Not Modify): Observation [104]  PT Acc Code (Do Not Modify): Observation [10022]       B Medical/Surgery History Past Medical History:  Diagnosis Date  . Bell's palsy 1966   ? right side facial droop  . Carotid artery disease ([place]) 2010   on vascular screening;unchanged 2013.(could not tolerate simvastatin, no other statins tried)--<30% blockage bilat 07/2011  . Chronic abdominal pain   . Chronic fatigue and malaise   . Claustrophobia   . Depression  treated in the past for years;stopped in 2010 for a years  . Duodenal ulcer 1962   h/o  . Dysrhythmia    ocassional PVC's  . Fibromyalgia   . 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 GERD (gastroesophageal reflux disease)   . Glaucoma, narrow-angle    s/p laser surgery  . History of hiatal hernia    during endoscopy  . Hypothyroid 9/08  . IBS (irritable bowel syndrome)    Dr. Benson [place]  . Ocular migraine   . Osteoporosis 10/11    Dr.Hawkes  . Panic attack   . Recurrent UTI    has cystocele-Dr.Grewal  . Shingles 1999   h/o  . Superficial thrombophlebitis 03/2009   RLE  . Trochanteric bursitis 12/2008   bilateral   Past Surgical History:  Procedure Laterality Date  . ABDOMINAL HYSTERECTOMY    . CATARACT EXTRACTION, BILATERAL  1995, 1996  . Flexible sigmoidoscopy    . THYROIDECTOMY, PARTIAL  09/2005   L nodule; Dr. Harlow Asa  . TONSILLECTOMY  1946  . UPPER GI ENDOSCOPY  06/27/12  . VAGINAL HYSTERECTOMY  1971   and bladder repair.  Still has ovaries     A IV Location/Drains/Wounds Patient Lines/Drains/Airways Status   Active Line/Drains/Airways    Name:   Placement date:   Placement time:   Site:   Days:   Peripheral IV 11/21/18 Left Antecubital   11/21/18    2300    Antecubital   1          Intake/Output Last 24 hours No intake or output data in the 24 hours ending 11/22/18 0431  Labs/Imaging Results for orders placed or performed during the hospital encounter of 11/21/18 (from the past 48 hour(s))  Urinalysis, Routine w reflex microscopic     Status: Abnormal   Collection Time: 11/21/18  9:57 PM  Result Value Ref Range   Color, Urine YELLOW YELLOW   APPearance CLOUDY (A) CLEAR   Specific Gravity, Urine 1.017 1.005 - 1.030   pH 5.0 5.0 - 8.0   Glucose, UA NEGATIVE NEGATIVE mg/dL   Hgb urine dipstick NEGATIVE NEGATIVE   Bilirubin Urine NEGATIVE NEGATIVE   Ketones, ur 5 (A) NEGATIVE mg/dL   Protein, ur NEGATIVE NEGATIVE mg/dL   Nitrite POSITIVE (A) NEGATIVE   Leukocytes,Ua LARGE (A) NEGATIVE   RBC / HPF 6-10 0 - 5 RBC/hpf   WBC, UA 21-50 0 - 5 WBC/hpf   Bacteria, UA MANY (A) NONE SEEN   Squamous Epithelial / LPF 6-10 0 - 5   Mucus PRESENT    Hyaline Casts, UA PRESENT    Amorphous Crystal PRESENT     Comment: Performed at [facility], [place] [address] [place], [place] [address]  CBC with Differential     Status: Abnormal   Collection Time: 11/21/18 11:00 PM  Result Value  Ref Range   WBC 20.8 (H) 4.0 - 10.5 K/uL   RBC 4.85 3.87 - 5.11 MIL/uL   Hemoglobin 14.7 12.0 - 15.0 g/dL   HCT 47.4 (H) 36.0 - 46.0 %   MCV 97.7 80.0 - 100.0 fL   MCH 30.3 26.0 - 34.0 pg   MCHC 31.0 30.0 - 36.0 g/dL   RDW 13.2 11.5 - 15.5 %   Platelets 288 150 - 400 K/uL   nRBC 0.0 0.0 - 0.2 %   Neutrophils Relative % 92 %   Neutro Abs 18.8 (H) 1.7 - 7.7 K/uL  Lymphocytes Relative 4 %   Lymphs Abs 0.9 0.7 - 4.0 K/uL   Monocytes Relative 3 %   Monocytes Absolute 0.7 0.1 - 1.0 K/uL   Eosinophils Relative 0 %   Eosinophils Absolute 0.1 0.0 - 0.5 K/uL   Basophils Relative 0 %   Basophils Absolute 0.1 0.0 - 0.1 K/uL   Immature Granulocytes 1 %   Abs Immature Granulocytes 0.20 (H) 0.00 - 0.07 K/uL    Comment: Performed at [facility], [place] [address] [place], [place] [address]  Comprehensive metabolic panel     Status: Abnormal   Collection Time: 11/21/18 11:00 PM  Result Value Ref Range   Sodium 139 135 - 145 mmol/L   Potassium 3.7 3.5 - 5.1 mmol/L   Chloride 105 98 - 111 mmol/L   CO2 25 22 - 32 mmol/L   Glucose, Bld 146 (H) 70 - 99 mg/dL   BUN 15 8 - 23 mg/dL   Creatinine, Ser 0.70 0.44 - 1.00 mg/dL   Calcium 9.2 8.9 - 10.3 mg/dL   Total Protein 7.4 6.5 - 8.1 g/dL   Albumin 4.0 3.5 - 5.0 g/dL   AST 22 15 - 41 U/L   ALT <5 0 - 44 U/L   Alkaline Phosphatase 77 38 - 126 U/L   Total Bilirubin 0.9 0.3 - 1.2 mg/dL   GFR calc non Af Amer >60 >60 mL/min   GFR calc Af Amer >60 >60 mL/min   Anion gap 9 5 - 15    Comment: Performed at [facility], [place] [address] [place], [place] [address]  Lipase, blood     Status: None   Collection Time: 11/21/18 11:00 PM  Result Value Ref Range   Lipase 44 11 - 51 U/L    Comment: Performed at [facility], [place] [address], [place], [place] [address]   Ct Abdomen Pelvis Wo Contrast  Result Date: 11/22/2018 CLINICAL DATA:  Initial evaluation for acute abdominal pain. Diverticulitis  suspected. EXAM: CT ABDOMEN AND PELVIS WITHOUT CONTRAST TECHNIQUE: Multidetector CT imaging of the abdomen and pelvis was performed following the standard protocol without IV contrast. COMPARISON:  Prior CT from 09/18/2014. FINDINGS: Lower chest: Scattered subsegmental atelectatic changes and/or fibrosis noted within the visualized lung bases. 7 mm pleural base nodular density at the posterior right lung base (series 4, image 34), indeterminate. Visualized lungs are otherwise clear. Hepatobiliary: Limited noncontrast evaluation of the liver is unremarkable. Gallbladder normal. No biliary dilatation. Pancreas: Pancreas within normal limits. Spleen: Spleen within normal limits. Adrenals/Urinary Tract: Adrenal glands are normal. Kidneys equal in size without evidence for nephrolithiasis or hydronephrosis. No radiopaque calculi seen along the course of either renal collecting system. No hydroureter. Partially distended bladder within normal limits. Stomach/Bowel: Stomach mildly distended with enteric contrast material within the gastric lumen. No evidence for bowel obstruction. Normal appendix. Colon largely decompressed. Mild circumferential wall thickening about the transverse colon, which could be related incomplete distension or possibly mild colitis. No other acute inflammatory changes about the bowels. Vascular/Lymphatic: Mild aorto bi-iliac atherosclerotic disease. No aneurysm. No adenopathy. Reproductive: Uterus is absent. Atrophic right ovary. 3.7 cm simple cyst present within the left ovary. Other: No free air or fluid. Musculoskeletal: No acute osseous finding. No discrete lytic or blastic osseous lesions. IMPRESSION: 1. Mild circumferential wall thickening about the transverse colon, which may be related incomplete distension or possibly mild colitis. 2. No other acute intra-abdominal or pelvic process. 3. 3.7 cm simple left ovarian cyst.  Follow-up examination with dedicated pelvic ultrasound recommended for  complete evaluation. 4. 7 mm pleural-based nodular density at the posterior right lung base, indeterminate. Non-contrast chest CT at 6-12 months is recommended. If the nodule is stable at time of repeat CT, then future CT at 18-24 months (from today's scan) is considered optional for low-risk patients, but is recommended for high-risk patients. This recommendation follows the consensus statement: Guidelines for Management of Incidental Pulmonary Nodules Detected on CT Images: From the Fleischner Society 2017; Radiology 2017; 284:228-243. Electronically Signed   By: Jeannine Boga M.D.   On: 11/22/2018 02:17    Pending Labs Unresulted Labs (From admission, onward)    Start     Ordered   11/22/18 0142  Urine culture  Add-on,   STAT     11/22/18 0141          Vitals/Pain Today's Vitals   11/22/18 0315 11/22/18 0330 11/22/18 0331 11/22/18 0345  BP:    (!) 121/52  Pulse: (!) 209 76  80  Resp: (!) 9 13  13   Temp:      TempSrc:      SpO2: (!) 86% 99%  100%  Weight:   48.5 kg   Height:   4\' 11"  (1.499 m)   PainSc:        Isolation Precautions No active isolations  Medications Medications  piperacillin-tazobactam (ZOSYN) IVPB 3.375 g (3.375 g Intravenous New Bag/Given 11/22/18 0326)  fentaNYL (SUBLIMAZE) injection 50 mcg (50 mcg Intravenous Given 11/21/18 2303)  ondansetron (ZOFRAN) injection 4 mg (4 mg Intravenous Given 11/21/18 2303)  fentaNYL (SUBLIMAZE) injection 50 mcg (50 mcg Intravenous Given 11/22/18 0147)  sodium chloride 0.9 % bolus 500 mL (500 mLs Intravenous New Bag/Given 11/22/18 0324)  acetaminophen (TYLENOL) tablet 1,000 mg (1,000 mg Oral Given 11/22/18 0325)  fentaNYL (SUBLIMAZE) injection 50 mcg (50 mcg Intravenous Given 11/22/18 0331)    Mobility walks with person assist Low fall risk   Focused Assessments Abdominal Pain   R Recommendations: See Admitting Provider Note  Report given to: Katie RN  Additional Notes:

## 2018-11-22 NOTE — Evaluation (Signed)
Physical Therapy Evaluation Patient Details Name: Tracy Malone MRN: [id number] DOB: 14-Jun-1932 Today's Date: 11/22/2018   History of Present Illness  85 yo female admitted to ED on 4/30 with abdominal pain, CT revealing colitis. Pt also with UTI. PMH includes CAD, depression, fibromyalgia, PVCs, IBS, migraines, OP, Parkinson's disease.  Clinical Impression  Pt presents with abdominal pain, increased time and effort to perform mobility tasks, hand tremors, and decreased activity tolerance due to abdominal pain. Pt to benefit from acute PT to address deficits. Pt ambulated hallway distance with use of IV pole for steadying, limited by abdominal pain and lack of mobility over the past couple of days. PT expects pt to progress well with mobility, given prior independent status and motivation to return to PLOF. PT to progress mobility as tolerated, and will continue to follow acutely.      Follow Up Recommendations No PT follow up;Supervision for mobility/OOB    Equipment Recommendations  None recommended by PT    Recommendations for Other Services       Precautions / Restrictions Precautions Precautions: Fall Restrictions Weight Bearing Restrictions: No      Mobility  Bed Mobility Overal bed mobility: Needs Assistance Bed Mobility: Supine to Sit     Supine to sit: Min guard;HOB elevated     General bed mobility comments: Min guard for safety, increased time to perform and use of bed rails.   Transfers Overall transfer level: Needs assistance Equipment used: None Transfers: Sit to/from Stand Sit to Stand: Min guard;From elevated surface         General transfer comment: Min guard for safety, pt with self-steadying upon standing. Pt does wish to use IV pole for steadying during ambulation for pt comfort.   Ambulation/Gait Ambulation/Gait assistance: Supervision Gait Distance (Feet): 150 Feet Assistive device: IV Pole Gait Pattern/deviations: Step-through  pattern;Decreased stride length Gait velocity: slightly decr   General Gait Details: Supervision for safety, pt with use of IV pole for steadying and if not using IV pole pt reaches for environment. Pt with short steps, no festinating/freezing of gait noted.   Stairs            Wheelchair Mobility    Modified Rankin (Stroke Patients Only)       Balance Overall balance assessment: Mild deficits observed, not formally tested                                           Pertinent Vitals/Pain Pain Assessment: Faces Faces Pain Scale: Hurts even more Pain Location: abdomen, with mobility  Pain Descriptors / Indicators: Discomfort;Cramping Pain Intervention(s): Monitored during session;Repositioned;Limited activity within patient's tolerance    Home Living Family/patient expects to be discharged to:: Private residence Living Arrangements: Spouse/significant other Available Help at Discharge: Family;Available 24 hours/day Type of Home: Independent living facility Home Access: Level entry     Home Layout: Two level;Able to live on main level with bedroom/bathroom Home Equipment: Shower seat - built in      Prior Function Level of Independence: Independent         Comments: Pt reports doing everything for self, but husband and pt do have a housekeeper. Pt states her MD has 2 palliative care attendants check on her and husband from time to time.      Hand Dominance   Dominant Hand: Right    Extremity/Trunk Assessment   Upper Extremity  Assessment Upper Extremity Assessment: Overall WFL for tasks assessed(Pt with tremor R>L)    Lower Extremity Assessment Lower Extremity Assessment: Overall WFL for tasks assessed    Cervical / Trunk Assessment Cervical / Trunk Assessment: Normal  Communication   Communication: No difficulties  Cognition Arousal/Alertness: Awake/alert Behavior During Therapy: WFL for tasks assessed/performed Overall Cognitive  Status: Within Functional Limits for tasks assessed                                        General Comments General comments (skin integrity, edema, etc.): Pt on 3LO2 upon PT arrival to room. Pt on RA for PT eval, sats ranging from 97-98%. RN notified, pt dropped to 2LO2.     Exercises     Assessment/Plan    PT Assessment Patient needs continued PT services  PT Problem List Pain;Decreased activity tolerance;Decreased mobility;Decreased safety awareness       PT Treatment Interventions DME instruction;Functional mobility training;Balance training;Patient/family education;Gait training;Therapeutic exercise;Stair training;Therapeutic activities;Neuromuscular re-education    PT Goals (Current goals can be found in the Care Plan section)  Acute Rehab PT Goals Patient Stated Goal: go home to husband  PT Goal Formulation: With patient Time For Goal Achievement: 12/06/18 Potential to Achieve Goals: Good    Frequency Min 3X/week   Barriers to discharge        Co-evaluation               AM-PAC PT "6 Clicks" Mobility  Outcome Measure Help needed turning from your back to your side while in a flat bed without using bedrails?: A Little Help needed moving from lying on your back to sitting on the side of a flat bed without using bedrails?: A Little Help needed moving to and from a bed to a chair (including a wheelchair)?: None Help needed standing up from a chair using your arms (e.g., wheelchair or bedside chair)?: None Help needed to walk in hospital room?: None Help needed climbing 3-5 steps with a railing? : A Little 6 Click Score: 21    End of Session Equipment Utilized During Treatment: Gait belt Activity Tolerance: Patient tolerated treatment well;Patient limited by fatigue;Patient limited by pain Patient left: in chair;with chair alarm set;with call bell/phone within reach Nurse Communication: Mobility status PT Visit Diagnosis: Other abnormalities of  gait and mobility (R26.89);Pain Pain - Right/Left: (lower) Pain - part of body: (abdomen)    Time: 1610-9604 PT Time Calculation (min) (ACUTE ONLY): 23 min   Charges:   PT Evaluation $PT Eval Low Complexity: 1 Low PT Treatments $Gait Training: 8-22 mins       Nicola Police, PT Acute Rehabilitation Services Pager [phone]  Office [phone]  Tracy Malone 11/22/2018, 4:12 PM

## 2018-11-22 NOTE — Progress Notes (Signed)
Pharmacy Antibiotic Note  Tracy Malone is a 85 y.o. female admitted on 11/21/2018 with intra-abdominal infection.  Pharmacy has been consulted for zosyn dosing.  Plan: Zosyn 3.375g IV q8h (4 hour infusion).  F/u scr/cultures  Height: 4\' 11"  (149.9 cm) Weight: 107 lb (48.5 kg) IBW/kg (Calculated) : 43.2  Temp (24hrs), Avg:98.3 F (36.8 C), Min:98.3 F (36.8 C), Max:98.3 F (36.8 C)  Recent Labs  Lab 11/21/18 2300  WBC 20.8*  CREATININE 0.70    Estimated Creatinine Clearance: 35.1 mL/min (by C-G formula based on SCr of 0.7 mg/dL).    Allergies  Allergen Reactions  . Iodine Anaphylaxis    IV and topical forms.  Burnard Leigh [Hyoscyamine Sulfate]     Vision problems/pt has glaucoma  . Salmon [Fish Allergy] Hives and Shortness Of Breath  . Shellfish Allergy Anaphylaxis  . Remeron [Mirtazapine] Other (See Comments)    Cause blurred vision and red eyes, pt has glaucoma  . Aspirin Other (See Comments)    Sever stomach pain due to ulcer scaring.  . Ciprofloxacin Diarrhea  . Codeine Nausea And Vomiting  . Darvocet [Propoxyphene N-Acetaminophen] Nausea And Vomiting  . Demerol [Meperidine] Nausea Only  . Dexilant [Dexlansoprazole] Swelling    Redness, swelling and peeling of both feet.  . Diphedryl [Diphenhydramine] Other (See Comments)    Increased pulse/small amount ok  . Doxycycline Hyclate Other (See Comments)    GI intolerance.  Marland Kitchen Epinephrine Other (See Comments)    Breathing problems  . Erythromycin Other (See Comments)    GI intolerance.  Yvette Rack [Cyclobenzaprine] Other (See Comments)    Tingly/prickly sensation.  Marland Kitchen Keflex [Cephalexin] Hives  . Latex Other (See Comments)    Gloves ok.  Skin gets red from elastic in underwear and latex bandaides.  . Nitrofurantoin Diarrhea  . Prednisone Other (See Comments)    Headache  . Pylera [Bis Subcit-Metronid-Tetracyc] Swelling    Tongue swelling. Face tingling  . Sulfa Antibiotics Other (See Comments)    Increased  pulse, fainting, diarrhea, thrush  . Xylocaine [Lidocaine Hcl]     With epinephrine, given by dentist.  Speeded up heart rate and she passed out (occured twice, at dentist)  . Zoloft [Sertraline Hcl] Swelling and Other (See Comments)    Migraine Swelling of tongue/lip (09/2012)  . Advil [Ibuprofen] Other (See Comments)    Motrin ok with a GI effect.  . Clarithromycin Rash    Started after completing 10 day course of 2000 mg /day    Antimicrobials this admission: 5/1 zosyn >>    >>   Dose adjustments this admission:   Microbiology results:  BCx:   UCx:    Sputum:    MRSA PCR:  Thank you for allowing pharmacy to be a part of this patient's care.  Dorrene German 11/22/2018 4:48 AM

## 2018-11-22 NOTE — H&P (Signed)
History and Physical    Tracy Malone HDQ:[id number] DOB: [birth year] DOA: 11/21/2018  PCP: Tracy Ohara, MD   Patient coming from: Home.  Chief Complaint: Abdominal pain.  Rectal bleeding.  HPI: Tracy Malone is a 85 y.o. female with history of Parkinson's disease and hypothyroidism has been experiencing suprapubic and left lower quadrant pain for last 3 to 4 weeks.  Denies any nausea vomiting.  Has chronic constipation.  Patient has had UTI in the month of March about 2 months ago when patient was given nitrofurantoin and after taking for 3 days developed diarrhea and was switched to Cipro after which patient developed some gastritis-like symptoms.  Following which patient has been exam constipation.  Last week patient had constipation for 5 days and had a bowel movement after taking MiraLAX.  Patient also had visited OB/GYN about 2 weeks ago and was told that her pain is likely related to the colon and not related to bladder or uterine.  Patient was referred to Dr. Ardis Malone gastroenterologist which patient is here to follow.  Patient also had followed up with hematologist because of weakness.  Patient's abdominal pain is mostly suprapubic and left lower quadrant crampy in nature has no relation to food or bowel movement.  Yesterday patient had increasing pain and also for the first time will be some blood in the stools.  ED Course: In the ER patient was afebrile and hemodynamically stable.  CT scan of the abdomen and pelvis done showed colon thickening around the transverse colon concerning for colitis and UA is consistent with UTI.  There also was left ovarian cyst which will need further study.  Patient still complains of some pain.  Blood count shows leukocytosis.  Patient was started on empiric antibiotics IV fluids and admitted for abdominal pain differentials possibility includes colitis with possible UTI and also need further assessment of the ovarian cyst.  Review of Systems: As per HPI,  rest all negative.   Past Medical History:  Diagnosis Date   Bell's palsy 1966   ? right side facial droop   Carotid artery disease ([place]) 2010   on vascular screening;unchanged 2013.(could not tolerate simvastatin, no other statins tried)--<30% blockage bilat 07/2011   Chronic abdominal pain    Chronic fatigue and malaise    Claustrophobia    Depression    treated in the past for years;stopped in 2010 for a years   Duodenal ulcer 1962   h/o   Dysrhythmia    ocassional PVC's   Fibromyalgia    Frequent PVCs 07/2012   Seen by  Cards: benign, asymptomatic, normal EF   GERD (gastroesophageal reflux disease)    Glaucoma, narrow-angle    s/p laser surgery   History of hiatal hernia    during endoscopy   Hypothyroid 9/08   IBS (irritable bowel syndrome)    Tracy Malone   Ocular migraine    Osteoporosis 10/11   TracyHawkes   Panic attack    Recurrent UTI    has cystocele-[name]   Shingles 1999   h/o   Superficial thrombophlebitis 03/2009   RLE   Trochanteric bursitis 12/2008   bilateral    Past Surgical History:  Procedure Laterality Date   ABDOMINAL HYSTERECTOMY     CATARACT EXTRACTION, BILATERAL  1995, 1996   Flexible sigmoidoscopy     THYROIDECTOMY, PARTIAL  09/2005   L nodule; Tracy Malone   TONSILLECTOMY  1946   UPPER GI ENDOSCOPY  06/27/12   Tracy Malone  and bladder repair.  Still has ovaries     reports that she has never smoked. She has never used smokeless tobacco. She reports that she does not drink alcohol or use drugs.  Allergies  Allergen Reactions   Iodine Anaphylaxis    IV and topical forms.   Levsin [Hyoscyamine Sulfate]     Vision problems/pt has glaucoma   Salmon [Fish Allergy] Hives and Shortness Of Breath   Shellfish Allergy Anaphylaxis   Remeron [Mirtazapine] Other (See Comments)    Cause blurred vision and red eyes, pt has glaucoma   Aspirin Other (See Comments)    Sever stomach pain due  to ulcer scaring.   Ciprofloxacin Diarrhea   Codeine Nausea And Vomiting   Darvocet [Propoxyphene N-Acetaminophen] Nausea And Vomiting   Demerol [Meperidine] Nausea Only   Dexilant [Dexlansoprazole] Swelling    Redness, swelling and peeling of both feet.   Diphedryl [Diphenhydramine] Other (See Comments)    Increased pulse/small amount ok   Doxycycline Hyclate Other (See Comments)    GI intolerance.   Epinephrine Other (See Comments)    Breathing problems   Erythromycin Other (See Comments)    GI intolerance.   Flexeril [Cyclobenzaprine] Other (See Comments)    Tingly/prickly sensation.   Keflex [Cephalexin] Hives   Latex Other (See Comments)    Gloves ok.  Skin gets red from elastic in underwear and latex bandaides.   Nitrofurantoin Diarrhea   Prednisone Other (See Comments)    Headache   Pylera [Bis Subcit-Metronid-Tetracyc] Swelling    Tongue swelling. Face tingling   Sulfa Antibiotics Other (See Comments)    Increased pulse, fainting, diarrhea, thrush   Xylocaine [Lidocaine Hcl]     With epinephrine, given by dentist.  Speeded up heart rate and she passed out (occured twice, at dentist)   Zoloft [Sertraline Hcl] Swelling and Other (See Comments)    Migraine Swelling of tongue/lip (09/2012)   Advil [Ibuprofen] Other (See Comments)    Motrin ok with a GI effect.   Clarithromycin Rash    Started after completing 10 day course of 2000 mg /day    Family History  Problem Relation Age of Onset   Heart disease Mother    Hypertension Mother    Hypertension Sister    HIV Son    Heart disease Brother    Lung cancer Brother        lung   Diabetes Maternal Grandfather    Diabetes Granddaughter        type 1    Prior to Admission medications   Medication Sig Start Date End Date Taking? Authorizing Provider  ALPRAZolam (XANAX) 0.5 MG tablet TAKE BY MOUTH UP TO 3 TIMES DAILY. WARNING: BENZODIAZEPINES INCREASE THE RISK OF FALLS AND NEUROCOGNITIVE  DECLINE IN THE ELDERLY Patient taking differently: Take 0.5 mg by mouth 4 (four) times daily as needed for anxiety.  02/18/18  Yes Eksir, Richard Miu, MD  b complex vitamins capsule Take 1 capsule by mouth daily.    Yes [provider]  Carbidopa-Levodopa ER (SINEMET CR) 25-100 MG tablet controlled release Take 1 tablet by mouth 4 (four) times daily. Patient taking differently: Take 1 tablet by mouth 3 (three) times daily.  10/24/18  Yes Tat, Eustace Quail, DO  Cholecalciferol (VITAMIN D) 2000 UNITS tablet Take 2,000 Units by mouth daily.   Yes [provider]  Homeopathic Products (AZO CONFIDENCE PO) Take 1 tablet by mouth once.   Yes [provider]  Probiotic Product (ALIGN PO) Take  1 capsule by mouth daily.    Yes [provider]  selegiline (ELDEPRYL) 5 MG capsule Take 5 mg by mouth daily.   Yes [provider]  SYNTHROID 25 MCG tablet TAKE 1 TABLET DAILY BEFORE BREAKFAST Patient taking differently: Take 25 mcg by mouth daily before breakfast.  09/30/18  Yes Tracy Ohara, MD  vitamin E 400 UNIT capsule Take 400 Units by mouth every Monday, Wednesday, and Friday. Reported on 09/06/2015   Yes [provider]    Physical Exam: Vitals:   11/22/18 0315 11/22/18 0330 11/22/18 0331 11/22/18 0345  BP:    (!) 121/52  Pulse: (!) 209 76  80  Resp: (!) 9 13  13   Temp:      TempSrc:      SpO2: (!) 86% 99%  100%  Weight:   48.5 kg   Height:   4\' 11"  (1.499 m)       Constitutional: Moderately built and nourished. Vitals:   11/22/18 0315 11/22/18 0330 11/22/18 0331 11/22/18 0345  BP:    (!) 121/52  Pulse: (!) 209 76  80  Resp: (!) 9 13  13   Temp:      TempSrc:      SpO2: (!) 86% 99%  100%  Weight:   48.5 kg   Height:   4\' 11"  (1.499 m)    Eyes: Anicteric no pallor. ENMT: No discharge from the ears eyes nose and mouth. Neck: No mass felt.  No neck rigidity. Respiratory: No rhonchi or crepitations. Cardiovascular: S1-S2 heard. Abdomen:  Soft nontender bowel sounds present.  No rigidity no rebound tenderness. Musculoskeletal: No edema.  No joint effusion. Skin: No rash. Neurologic: Alert awake oriented to time place and person.  Moves all extremities. Psychiatric: Appears normal.   Labs on Admission: I have personally reviewed following labs and imaging studies  CBC: Recent Labs  Lab 11/21/18 2300  WBC 20.8*  NEUTROABS 18.8*  HGB 14.7  HCT 47.4*  MCV 97.7  PLT 623   Basic Metabolic Panel: Recent Labs  Lab 11/21/18 2300  NA 139  K 3.7  CL 105  CO2 25  GLUCOSE 146*  BUN 15  CREATININE 0.70  CALCIUM 9.2   GFR: Estimated Creatinine Clearance: 35.1 mL/min (by C-G formula based on SCr of 0.7 mg/dL). Liver Function Tests: Recent Labs  Lab 11/21/18 2300  AST 22  ALT <5  ALKPHOS 77  BILITOT 0.9  PROT 7.4  ALBUMIN 4.0   Recent Labs  Lab 11/21/18 2300  LIPASE 44   No results for input(s): AMMONIA in the last 168 hours. Coagulation Profile: No results for input(s): INR, PROTIME in the last 168 hours. Cardiac Enzymes: No results for input(s): CKTOTAL, CKMB, CKMBINDEX, TROPONINI in the last 168 hours. BNP (last 3 results) No results for input(s): PROBNP in the last 8760 hours. HbA1C: No results for input(s): HGBA1C in the last 72 hours. CBG: No results for input(s): GLUCAP in the last 168 hours. Lipid Profile: No results for input(s): CHOL, HDL, LDLCALC, TRIG, CHOLHDL, LDLDIRECT in the last 72 hours. Thyroid Function Tests: No results for input(s): TSH, T4TOTAL, FREET4, T3FREE, THYROIDAB in the last 72 hours. Anemia Panel: No results for input(s): VITAMINB12, FOLATE, FERRITIN, TIBC, IRON, RETICCTPCT in the last 72 hours. Urine analysis:    Component Value Date/Time   COLORURINE YELLOW 11/21/2018 2157   APPEARANCEUR CLOUDY (A) 11/21/2018 2157   LABSPEC 1.017 11/21/2018 2157   LABSPEC 1.015 10/01/2018 0857   PHURINE 5.0 11/21/2018 2157  GLUCOSEU NEGATIVE 11/21/2018 2157   HGBUR NEGATIVE  11/21/2018 2157   BILIRUBINUR NEGATIVE 11/21/2018 2157   BILIRUBINUR negative 10/01/2018 0857   BILIRUBINUR neg 09/06/2015 1334   KETONESUR 5 (A) 11/21/2018 2157   PROTEINUR NEGATIVE 11/21/2018 2157   UROBILINOGEN negative 09/06/2015 1334   UROBILINOGEN 0.2 07/04/2013 1050   NITRITE POSITIVE (A) 11/21/2018 2157   LEUKOCYTESUR LARGE (A) 11/21/2018 2157   Sepsis Labs: @LABRCNTIP (procalcitonin:4,lacticidven:4) )No results found for this or any previous visit (from the past 240 hour(s)).   Radiological Exams on Admission: Ct Abdomen Pelvis Wo Contrast  Result Date: 11/22/2018 CLINICAL DATA:  Initial evaluation for acute abdominal pain. Diverticulitis suspected. EXAM: CT ABDOMEN AND PELVIS WITHOUT CONTRAST TECHNIQUE: Multidetector CT imaging of the abdomen and pelvis was performed following the standard protocol without IV contrast. COMPARISON:  Prior CT from 09/18/2014. FINDINGS: Lower chest: Scattered subsegmental atelectatic changes and/or fibrosis noted within the visualized lung bases. 7 mm pleural base nodular density at the posterior right lung base (series 4, image 34), indeterminate. Visualized lungs are otherwise clear. Hepatobiliary: Limited noncontrast evaluation of the liver is unremarkable. Gallbladder normal. No biliary dilatation. Pancreas: Pancreas within normal limits. Spleen: Spleen within normal limits. Adrenals/Urinary Tract: Adrenal glands are normal. Kidneys equal in size without evidence for nephrolithiasis or hydronephrosis. No radiopaque calculi seen along the course of either renal collecting system. No hydroureter. Partially distended bladder within normal limits. Stomach/Bowel: Stomach mildly distended with enteric contrast material within the gastric lumen. No evidence for bowel obstruction. Normal appendix. Colon largely decompressed. Mild circumferential wall thickening about the transverse colon, which could be related incomplete distension or possibly mild colitis. No  other acute inflammatory changes about the bowels. Vascular/Lymphatic: Mild aorto bi-iliac atherosclerotic disease. No aneurysm. No adenopathy. Reproductive: Uterus is absent. Atrophic right ovary. 3.7 cm simple cyst present within the left ovary. Other: No free air or fluid. Musculoskeletal: No acute osseous finding. No discrete lytic or blastic osseous lesions. IMPRESSION: 1. Mild circumferential wall thickening about the transverse colon, which may be related incomplete distension or possibly mild colitis. 2. No other acute intra-abdominal or pelvic process. 3. 3.7 cm simple left ovarian cyst. Follow-up examination with dedicated pelvic ultrasound recommended for complete evaluation. 4. 7 mm pleural-based nodular density at the posterior right lung base, indeterminate. Non-contrast chest CT at 6-12 months is recommended. If the nodule is stable at time of repeat CT, then future CT at 18-24 months (from today's scan) is considered optional for low-risk patients, but is recommended for high-risk patients. This recommendation follows the consensus statement: Guidelines for Management of Incidental Pulmonary Nodules Detected on CT Images: From the Fleischner Society 2017; Radiology 2017; 284:228-243. Electronically Signed   By: Jeannine Boga M.D.   On: 11/22/2018 02:17     Assessment/Plan Principal Problem:   Abdominal pain Active Problems:   Hypothyroidism   Parkinson's disease ([place])   Colitis   Acute lower UTI    1. Abdominal pain -differentials include colitis will have to rule out ischemic colitis and also could be from UTI given the suprapubic nature of the pain and also that is a cyst on the left side for which I have ordered pelvic ultrasound.  Will check lactate keep patient on IV fluids antibiotics and may consult Dr. Ardis Malone gastroenterologist in the morning since patient is supposed to follow as outpatient. 2. Rectal bleeding -this is happened yesterday has not had any further episodes  after admission.  Will check serial CBC.  Has had a sigmoidoscopy in 2013.  Which showed hemorrhoids.  Last upper endoscopy was in 2018.  As discussed in #1 may consult Dr. Ardis Malone gastroenterologist in the morning. 3. Parkinson's disease on carbidopa levodopa and selegiline. 4. Hypothyroidism on Synthroid. 5. Anxiety on PRN Xanax.   DVT prophylaxis: SCDs. Code Status: Full code. Family Communication: Discussed with patient. Disposition Plan: Home. Consults called: None. Admission status: Observation.   Rise Patience MD [facility] Pager [phone].  If 7PM-7AM, please contact night-coverage [email or web address] Password [facility]  11/22/2018, 4:34 AM

## 2018-11-22 NOTE — Telephone Encounter (Signed)
I explained to the patient's husband that the inpatient team caring for his wife will need to make a consult request for GI to see her while she is in the hospital.  He thanked me for the call and will contact her inpatient nurse to make that request.

## 2018-11-22 NOTE — Progress Notes (Signed)
MD Darliss Cheney notified and aware of critical lactic acid of 2.3.

## 2018-11-23 LAB — CBC WITH DIFFERENTIAL/PLATELET
Abs Immature Granulocytes: 0.12 10*3/uL — ABNORMAL HIGH (ref 0.00–0.07)
Basophils Absolute: 0.1 10*3/uL (ref 0.0–0.1)
Basophils Relative: 0 %
Eosinophils Absolute: 0.2 10*3/uL (ref 0.0–0.5)
Eosinophils Relative: 1 %
HCT: 42.7 % (ref 36.0–46.0)
Hemoglobin: 13.3 g/dL (ref 12.0–15.0)
Immature Granulocytes: 1 %
Lymphocytes Relative: 13 %
Lymphs Abs: 2.5 10*3/uL (ref 0.7–4.0)
MCH: 30.2 pg (ref 26.0–34.0)
MCHC: 31.1 g/dL (ref 30.0–36.0)
MCV: 96.8 fL (ref 80.0–100.0)
Monocytes Absolute: 1.7 10*3/uL — ABNORMAL HIGH (ref 0.1–1.0)
Monocytes Relative: 9 %
Neutro Abs: 14.7 10*3/uL — ABNORMAL HIGH (ref 1.7–7.7)
Neutrophils Relative %: 76 %
Platelets: 284 10*3/uL (ref 150–400)
RBC: 4.41 MIL/uL (ref 3.87–5.11)
RDW: 13.2 % (ref 11.5–15.5)
WBC: 19.3 10*3/uL — ABNORMAL HIGH (ref 4.0–10.5)
nRBC: 0 % (ref 0.0–0.2)

## 2018-11-23 LAB — COMPREHENSIVE METABOLIC PANEL
ALT: 11 U/L (ref 0–44)
AST: 20 U/L (ref 15–41)
Albumin: 3.1 g/dL — ABNORMAL LOW (ref 3.5–5.0)
Alkaline Phosphatase: 63 U/L (ref 38–126)
Anion gap: 7 (ref 5–15)
BUN: 5 mg/dL — ABNORMAL LOW (ref 8–23)
CO2: 24 mmol/L (ref 22–32)
Calcium: 8.3 mg/dL — ABNORMAL LOW (ref 8.9–10.3)
Chloride: 109 mmol/L (ref 98–111)
Creatinine, Ser: 0.62 mg/dL (ref 0.44–1.00)
GFR calc Af Amer: >60 mL/min (ref >60)
GFR calc non Af Amer: >60 mL/min (ref >60)
Glucose, Bld: 108 mg/dL — ABNORMAL HIGH (ref 70–99)
Potassium: 3.6 mmol/L (ref 3.5–5.1)
Sodium: 140 mmol/L (ref 135–145)
Total Bilirubin: 1.7 mg/dL — ABNORMAL HIGH (ref 0.3–1.2)
Total Protein: 6.1 g/dL — ABNORMAL LOW (ref 6.5–8.1)

## 2018-11-23 MED ORDER — BISMUTH SUBSALICYLATE 262 MG/15ML PO SUSP
30.0000 mL | Freq: Three times a day (TID) | ORAL | Status: DC
  Administered 2018-11-23 – 2018-11-25 (×8): 30 mL via ORAL
  Filled 2018-11-23 (×2): qty 236

## 2018-11-23 NOTE — Progress Notes (Signed)
Pt requesting something for gas as she complains of feeling bloated. Dr. Doristine Bosworth made aware. New order given. Pt made aware; but pt wants to hold off for now. VWilliams,RN.

## 2018-11-23 NOTE — Consult Note (Addendum)
Referring Provider: [facility]   Primary Care Physician:  [name], MD Primary Gastroenterologist:  Oretha Caprice, MD     Reason for Consultation:   Blood in stool, abnormal colon on CT scan.     ASSESSMENT / PLAN:     49. 85 yo female with probable ischemic colitis  Distribution of colon inflammation not typical for ischemic colitis but her presentation is. No further diarrhea / bleeding. No further abdominal pain.  -Afebrile. Her WBC is still increasing but urine + for Klebsiella. Consider narrowing antibiotics. Doesn't need Zosyn for ischemic colitis. -soft diet   2. UTI, see #1.   HPI:   Tracy Malone is a 85 y.o. female with Parkinson's disease, hypothyroidism.   Three weeks ago patient took antibiotics 3 weeks ago for UTI. Took Macrodantin but stopped after a few days due to diarrhea. Changed to Cipro and developed abdominal pain but no further diarrhea until Thursday night. Friday the cramping continued but she hasn't had any BMs since Thursday.  No associated N/V, no fever. CT scan - Mild circumferential wall thickening about the transverse colon, which could be related incomplete distension or possibly mild colitis. No other acute inflammatory changes about the bowels.  Past Medical History:  Diagnosis Date   Bell's palsy 1966   ? right side facial droop   Carotid artery disease ([place]) 2010   on vascular screening;unchanged 2013.(could not tolerate simvastatin, no other statins tried)--<30% blockage bilat 07/2011   Chronic abdominal pain    Chronic fatigue and malaise    Claustrophobia    Depression    treated in the past for years;stopped in 2010 for a years   Duodenal ulcer 1962   h/o   Dysrhythmia    ocassional PVC's   Fibromyalgia    Frequent PVCs 07/2012   Seen by [facility]: benign, asymptomatic, normal EF   GERD (gastroesophageal reflux disease)    Glaucoma, narrow-angle    s/p laser surgery   History of hiatal hernia    during  endoscopy   Hypothyroid 9/08   IBS (irritable bowel syndrome)    Dr. Benson [place]   Ocular migraine    Osteoporosis 10/11   Dr.Hawkes   Panic attack    Recurrent UTI    has cystocele-Dr.Grewal   Shingles 1999   h/o   Superficial thrombophlebitis 03/2009   RLE   Trochanteric bursitis 12/2008   bilateral    Past Surgical History:  Procedure Laterality Date   ABDOMINAL HYSTERECTOMY     CATARACT EXTRACTION, BILATERAL  1995, 1996   Flexible sigmoidoscopy     THYROIDECTOMY, PARTIAL  09/2005   L nodule; Dr. Harlow Asa   TONSILLECTOMY  1946   UPPER GI ENDOSCOPY  06/27/12   VAGINAL HYSTERECTOMY  1971   and bladder repair.  Still has ovaries    Prior to Admission medications   Medication Sig Start Date End Date Taking? Authorizing Provider  ALPRAZolam (XANAX) 0.5 MG tablet TAKE BY MOUTH UP TO 3 TIMES DAILY. WARNING: BENZODIAZEPINES INCREASE THE RISK OF FALLS AND NEUROCOGNITIVE DECLINE IN THE ELDERLY Patient taking differently: Take 0.5 mg by mouth 4 (four) times daily as needed for anxiety.  02/18/18  Yes Eksir, Richard Miu, MD  b complex vitamins capsule Take 1 capsule by mouth daily.    Yes [provider]  Carbidopa-Levodopa ER (SINEMET CR) 25-100 MG tablet controlled release Take 1 tablet by mouth 4 (four) times daily. Patient taking differently: Take 1 tablet by mouth 3 (three) times  daily.  10/24/18  Yes Tat, Eustace Quail, DO  Cholecalciferol (VITAMIN D) 2000 UNITS tablet Take 2,000 Units by mouth daily.   Yes [provider]  Homeopathic Products (AZO CONFIDENCE PO) Take 1 tablet by mouth once.   Yes [provider]  Probiotic Product (ALIGN PO) Take 1 capsule by mouth daily.    Yes [provider]  selegiline (ELDEPRYL) 5 MG capsule Take 5 mg by mouth daily.   Yes [provider]  SYNTHROID 25 MCG tablet TAKE 1 TABLET DAILY BEFORE BREAKFAST Patient taking differently: Take 25 mcg by mouth daily before breakfast.  09/30/18  Yes [name], MD  vitamin E 400 UNIT capsule Take 400 Units by mouth every Monday, Wednesday, and Friday. Reported on 09/06/2015   Yes [provider]    Current Facility-Administered Medications  Medication Dose Route Frequency Provider Last Rate Last Dose   acetaminophen (TYLENOL) tablet 650 mg  650 mg Oral Q6H PRN Rise Patience, MD       Or   acetaminophen (TYLENOL) suppository 650 mg  650 mg Rectal Q6H PRN Rise Patience, MD       ALPRAZolam Duanne Moron) tablet 0.5 mg  0.5 mg Oral QID PRN Rise Patience, MD   0.5 mg at 11/23/18 1016   Carbidopa-Levodopa ER (SINEMET CR) 25-100 MG tablet controlled release 1 tablet  1 tablet Oral TID Rise Patience, MD   1 tablet at 11/23/18 1238   fentaNYL (SUBLIMAZE) injection 25 mcg  25 mcg Intravenous Q2H PRN Rise Patience, MD   25 mcg at 11/23/18 0447   levothyroxine (SYNTHROID) tablet 25 mcg  25 mcg Oral QAC breakfast Rise Patience, MD   25 mcg at 11/23/18 0542   ondansetron (ZOFRAN) tablet 4 mg  4 mg Oral Q6H PRN Rise Patience, MD       Or   ondansetron [facility]) injection 4 mg  4 mg Intravenous Q6H PRN Rise Patience, MD       piperacillin-tazobactam (ZOSYN) IVPB 3.375 g  3.375 g Intravenous Q8H Dorrene German, RPH 12.5 mL/hr at 11/23/18 1243 3.375 g at 11/23/18 1243   selegiline (ELDEPRYL) tablet 5 mg  5 mg Oral Daily Rise Patience, MD   5 mg at 11/23/18 1016    Allergies as of 11/21/2018 - Review Complete 11/21/2018  Allergen Reaction Noted   Iodine Anaphylaxis 05/13/2012   Levsin [hyoscyamine sulfate]  10/13/2015   Salmon [fish allergy] Hives and Shortness Of Breath 05/13/2012   Shellfish allergy Anaphylaxis 05/13/2012   Remeron [mirtazapine] Other (See Comments) 08/25/2016   Aspirin Other (See Comments) 05/13/2012   Ciprofloxacin Diarrhea 05/13/2012   Codeine Nausea And Vomiting 05/13/2012   Darvocet [propoxyphene n-acetaminophen] Nausea And Vomiting 05/13/2012    Demerol [meperidine] Nausea Only 05/13/2012   Dexilant [dexlansoprazole] Swelling 03/01/2016   Diphedryl [diphenhydramine] Other (See Comments) 05/13/2012   Doxycycline hyclate Other (See Comments) 05/13/2012   Epinephrine Other (See Comments) 05/13/2012   Erythromycin Other (See Comments) 05/13/2012   Flexeril [cyclobenzaprine] Other (See Comments) 05/13/2012   Keflex [cephalexin] Hives 05/13/2012   Latex Other (See Comments) 05/13/2012   Prednisone Other (See Comments) 05/13/2012   Pylera [bis subcit-metronid-tetracyc] Swelling 08/01/2012   Sulfa antibiotics Other (See Comments) 05/13/2012   Xylocaine [lidocaine hcl]  05/13/2012   Zoloft [sertraline hcl] Swelling and Other (See Comments) 05/13/2012   Advil [ibuprofen] Other (See Comments) 05/13/2012   Clarithromycin Rash 01/14/2015    Family History  Problem Relation Age  of Onset   Heart disease Mother    Hypertension Mother    Hypertension Sister    HIV Son    Heart disease Brother    Lung cancer Brother        lung   Diabetes Maternal Grandfather    Diabetes Granddaughter        type 1    Social History   Socioeconomic History   Marital status: Married    Spouse name: Not on file   Number of children: 2   Years of education: Not on file   Highest education level: Not on file  Occupational History   Occupation: retired (school system)  Social Needs   Emergency planning/management officer strain: Not on file   Food insecurity:    Worry: Not on file    Inability: Not on file   Transportation needs:    Medical: Not on file    Non-medical: Not on file  Tobacco Use   Smoking status: Never Smoker   Smokeless tobacco: Never Used  Substance and Sexual Activity   Alcohol use: No    Alcohol/week: 0.0 standard drinks   Drug use: No   Sexual activity: Not Currently    Partners: Male  Lifestyle   Physical activity:    Days per week: Not on file    Minutes per session: Not on file   Stress: Not  on file  Relationships   Social connections:    Talks on phone: Not on file    Gets together: Not on file    Attends religious service: Not on file    Active member of club or organization: Not on file    Attends meetings of clubs or organizations: Not on file    Relationship status: Not on file   Intimate partner violence:    Fear of current or ex partner: Not on file    Emotionally abused: Not on file    Physically abused: Not on file    Forced sexual activity: Not on file  Other Topics Concern   Not on file  Social History Narrative   Married.  Son lives in [place]; Daughter [name] lives in [place]; 2 grandchildren    Review of Systems: All systems reviewed and negative except where noted in HPI.  Physical Exam: Vital signs in last 24 hours: Temp:  [98.4 F (36.9 C)-98.7 F (37.1 C)] 98.7 F (37.1 C) (05/02 0640) Pulse Rate:  [65-76] 65 (05/02 0640) Resp:  [18-20] 18 (05/02 0640) BP: (129-132)/(56-58) 130/56 (05/02 0640) SpO2:  [97 %-99 %] 97 % (05/02 0640) Weight:  [50.5 kg] 50.5 kg (05/02 0640) Last BM Date: 11/21/18 General:   Alert, well-developed,  female in NAD Psych:  Pleasant, cooperative. Normal mood and affect. Eyes:  Pupils equal, sclera clear, no icterus.   Conjunctiva pink. Ears:  Normal auditory acuity. Nose:  No deformity, discharge,  or lesions. Neck:  Supple; no masses Lungs:  Clear throughout to auscultation.   No wheezes, crackles, or rhonchi.  Heart:  Regular rate and rhythm; no murmurs, no lower extremity edema Abdomen:  Soft, non-distended, nontender, BS active, no palp mass    Rectal:  Deferred  Msk:  Symmetrical without gross deformities. . Neurologic:  Alert and  oriented x4;  grossly normal neurologically. Skin:  Intact without significant lesions or rashes.   Intake/Output from previous day: 05/01 0701 - 05/02 0700 In: 170 [P.O.:120; IV Piggyback:50] Out: -  Intake/Output this shift: No intake/output data recorded.  Lab  Results: Recent  Labs    11/22/18 0722 11/22/18 1126 11/23/18 0455  WBC 15.2* 16.2* 19.3*  HGB 13.9 14.3 13.3  HCT 45.4 45.8 42.7  PLT 291 252 284   BMET Recent Labs    11/21/18 2300 11/23/18 0455  NA 139 140  K 3.7 3.6  CL 105 109  CO2 25 24  GLUCOSE 146* 108*  BUN 15 5*  CREATININE 0.70 0.62  CALCIUM 9.2 8.3*   LFT Recent Labs    11/23/18 0455  PROT 6.1*  ALBUMIN 3.1*  AST 20  ALT 11  ALKPHOS 63  BILITOT 1.7*   PT/INR No results for input(s): LABPROT, INR in the last 72 hours. Hepatitis Panel No results for input(s): HEPBSAG, HCVAB, HEPAIGM, HEPBIGM in the last 72 hours.    Studies/Results: Ct Abdomen Pelvis Wo Contrast  Result Date: 11/22/2018 CLINICAL DATA:  Initial evaluation for acute abdominal pain. Diverticulitis suspected. EXAM: CT ABDOMEN AND PELVIS WITHOUT CONTRAST TECHNIQUE: Multidetector CT imaging of the abdomen and pelvis was performed following the standard protocol without IV contrast. COMPARISON:  Prior CT from 09/18/2014. FINDINGS: Lower chest: Scattered subsegmental atelectatic changes and/or fibrosis noted within the visualized lung bases. 7 mm pleural base nodular density at the posterior right lung base (series 4, image 34), indeterminate. Visualized lungs are otherwise clear. Hepatobiliary: Limited noncontrast evaluation of the liver is unremarkable. Gallbladder normal. No biliary dilatation. Pancreas: Pancreas within normal limits. Spleen: Spleen within normal limits. Adrenals/Urinary Tract: Adrenal glands are normal. Kidneys equal in size without evidence for nephrolithiasis or hydronephrosis. No radiopaque calculi seen along the course of either renal collecting system. No hydroureter. Partially distended bladder within normal limits. Stomach/Bowel: Stomach mildly distended with enteric contrast material within the gastric lumen. No evidence for bowel obstruction. Normal appendix. Colon largely decompressed. Mild circumferential wall thickening  about the transverse colon, which could be related incomplete distension or possibly mild colitis. No other acute inflammatory changes about the bowels. Vascular/Lymphatic: Mild aorto bi-iliac atherosclerotic disease. No aneurysm. No adenopathy. Reproductive: Uterus is absent. Atrophic right ovary. 3.7 cm simple cyst present within the left ovary. Other: No free air or fluid. Musculoskeletal: No acute osseous finding. No discrete lytic or blastic osseous lesions. IMPRESSION: 1. Mild circumferential wall thickening about the transverse colon, which may be related incomplete distension or possibly mild colitis. 2. No other acute intra-abdominal or pelvic process. 3. 3.7 cm simple left ovarian cyst. Follow-up examination with dedicated pelvic ultrasound recommended for complete evaluation. 4. 7 mm pleural-based nodular density at the posterior right lung base, indeterminate. Non-contrast chest CT at 6-12 months is recommended. If the nodule is stable at time of repeat CT, then future CT at 18-24 months (from today's scan) is considered optional for low-risk patients, but is recommended for high-risk patients. This recommendation follows the consensus statement: Guidelines for Management of Incidental Pulmonary Nodules Detected on CT Images: From the Fleischner Society 2017; Radiology 2017; 284:228-243. Electronically Signed   By: Jeannine Boga M.D.   On: 11/22/2018 02:17   US Pelvis (transabdominal Only)  Result Date: 11/22/2018 CLINICAL DATA:  Left ovarian cysts seen on abdominal CT. Left lower quadrant pain for 3-4 weeks EXAM: TRANSABDOMINAL ULTRASOUND OF PELVIS TECHNIQUE: Transabdominal ultrasound examination of the pelvis was performed including evaluation of the uterus, ovaries, adnexal regions, and pelvic cul-de-sac. COMPARISON:  Abdominal CT from earlier today FINDINGS: Uterus Surgically absent Right ovary Not visualized due to bowel.  No abnormality by CT. Left ovary Measurements: 4.6 x 4.5 x 3 cm =  volume: 32  mL. There is an underlying 3.4 cm avascular anechoic cyst. Other findings:  No abnormal free fluid. IMPRESSION: 1. Simple 3.4 cm left ovarian cyst. One year follow-up pelvic ultrasound is recommended if appropriate for comorbidities. 2. The right ovary was not visualized sonographically but the right adnexa was normal by preceding CT. Electronically Signed   By: Monte Fantasia M.D.   On: 11/22/2018 08:11     Tye Savoy, NP-C @  11/23/2018, 1:41 PM   Attending physician's note   I have taken a history, examined the patient and reviewed the chart. I agree with the Advanced Practitioner's note, impression and recommendations.  85 year old female with history of Parkinson's disease, hypothyroidism and chronic constipation. Diarrhea associated with abdominal pain and blood, CT with thickening transverse colon suggestive of possible ischemic colitis Symptoms have since resolved. Advance diet as tolerated Bowel regimen with MiraLAX daily No plan for colonoscopy at this time Urine culture positive for Klebsiella, sensitivity pending.  Taper antibiotic regimen based on antibiotic sensitivity  Please call if have any questions, will sign off  K. Denzil Magnuson , MD [phone]

## 2018-11-23 NOTE — Progress Notes (Signed)
Pt requesting xanax too close to last adminstration. Dr. Doristine Bosworth made aware. MD said it was OK to give drug. Med given.

## 2018-11-23 NOTE — Progress Notes (Signed)
PROGRESS NOTE    MARYCATHERINE Malone  HYW:[id number] DOB: Nov 01, 1931 DOA: 11/21/2018 PCP: [name], MD (Confirm with patient/family/NH records and if not entered, this HAS to be entered at [facility] point of entry. "No PCP" if truly none.)   Brief Narrative:  Tracy Malone is a 85 y.o. female with history of Parkinson's disease and hypothyroidism has been experiencing suprapubic and left lower quadrant pain for last 3 to 4 weeks.  Denied any nausea vomiting.  Has chronic constipation.  Patient also had visited OB/GYN about 2 weeks ago and was told that her pain is likely related to the colon and not related to bladder or uterine.  Patient was referred to Dr. Ardis Hughs gastroenterologist which patient is here to follow.  Patient also had followed up with hematologist because of weakness. Yesterday patient had increasing pain and also for the first time she noticed some blood in the stools so she came to ER.  In the ER patient was afebrile and hemodynamically stable.  CT scan of the abdomen and pelvis done showed colon thickening around the transverse colon concerning for colitis and UA is consistent with UTI.  CT abdomen also showed left simple ovarian cyst which was verified by pelvic ultrasound. Patient was started on empiric antibiotics of IV Zosyn and IV fluids and admitted under hospitalist service.  She is feeling better today.  GI has been consulted today.  Consultants:   GI  Procedures:   None  Antimicrobials:   Zosyn started on 11/21/2018   Subjective: Patient seen and examined.  She states that she feels better today.  Her abdominal pain is improved compared to yesterday.  No new complaint.  Objective: Vitals:   11/22/18 0513 11/22/18 1437 11/22/18 2125 11/23/18 0640  BP: 132/68 (!) 129/57 (!) 132/58 (!) 130/56  Pulse: 73 75 76 65  Resp: 18 20 19 18   Temp: 98.2 F (36.8 C) 98.4 F (36.9 C) 98.6 F (37 C) 98.7 F (37.1 C)  TempSrc: Oral Oral Oral Oral  SpO2: 91% 99% 99% 97%   Weight:    50.5 kg  Height:       No intake or output data in the 24 hours ending 11/23/18 1214 Filed Weights   11/22/18 0331 11/22/18 0510 11/23/18 0640  Weight: 48.5 kg 49.6 kg 50.5 kg    Examination:  General exam: Appears comfortable but anxious. Respiratory system: Clear to auscultation. Respiratory effort normal. Cardiovascular system: S1 & S2 heard, RRR. No JVD, murmurs, rubs, gallops or clicks. No pedal edema. Gastrointestinal system: Abdomen is nondistended, nontender, no organomegaly or masses felt. Normal bowel sounds heard. Central nervous system: Alert and oriented. No focal neurological deficits. Extremities: Symmetric 5 x 5 power.  Pill-rolling movements of right upper extremity Skin: No rashes, lesions or ulcers Psychiatry: Judgement and insight appear normal. Mood & affect appropriate.    Data Reviewed: I have personally reviewed following labs and imaging studies  CBC: Recent Labs  Lab 11/21/18 2300 11/22/18 0447 11/22/18 0722 11/22/18 1126 11/23/18 0455  WBC 20.8* 16.5* 15.2* 16.2* 19.3*  NEUTROABS 18.8*  --   --   --  14.7*  HGB 14.7 14.8 13.9 14.3 13.3  HCT 47.4* 46.8* 45.4 45.8 42.7  MCV 97.7 96.9 98.3 98.5 96.8  PLT 288 257 291 252 485   Basic Metabolic Panel: Recent Labs  Lab 11/21/18 2300 11/23/18 0455  NA 139 140  K 3.7 3.6  CL 105 109  CO2 25 24  GLUCOSE 146* 108*  BUN  15 5*  CREATININE 0.70 0.62  CALCIUM 9.2 8.3*   GFR: Estimated Creatinine Clearance: 35.1 mL/min (by C-G formula based on SCr of 0.62 mg/dL). Liver Function Tests: Recent Labs  Lab 11/21/18 2300 11/23/18 0455  AST 22 20  ALT <5 11  ALKPHOS 77 63  BILITOT 0.9 1.7*  PROT 7.4 6.1*  ALBUMIN 4.0 3.1*   Recent Labs  Lab 11/21/18 2300  LIPASE 44   No results for input(s): AMMONIA in the last 168 hours. Coagulation Profile: No results for input(s): INR, PROTIME in the last 168 hours. Cardiac Enzymes: No results for input(s): CKTOTAL, CKMB, CKMBINDEX,  TROPONINI in the last 168 hours. BNP (last 3 results) No results for input(s): PROBNP in the last 8760 hours. HbA1C: No results for input(s): HGBA1C in the last 72 hours. CBG: No results for input(s): GLUCAP in the last 168 hours. Lipid Profile: No results for input(s): CHOL, HDL, LDLCALC, TRIG, CHOLHDL, LDLDIRECT in the last 72 hours. Thyroid Function Tests: No results for input(s): TSH, T4TOTAL, FREET4, T3FREE, THYROIDAB in the last 72 hours. Anemia Panel: No results for input(s): VITAMINB12, FOLATE, FERRITIN, TIBC, IRON, RETICCTPCT in the last 72 hours. Sepsis Labs: Recent Labs  Lab 11/22/18 0448 11/22/18 0722 11/22/18 1406 11/22/18 1952  LATICACIDVEN 2.1* 2.3* 2.7* 2.4*    Recent Results (from the past 240 hour(s))  Urine culture     Status: Abnormal (Preliminary result)   Collection Time: 11/21/18  9:57 PM  Result Value Ref Range Status   Specimen Description   Final    URINE, RANDOM Performed at [facility], [place] [address] [place], [place] [address]    Special Requests   Final    NONE Performed at [facility], [place] [address], [place], [place] [address]    Culture (A)  Final    >=100,000 COLONIES/mL KLEBSIELLA PNEUMONIAE SUSCEPTIBILITIES TO FOLLOW Performed at [facility], [place] [address] [place], [place] [address]    Report Status PENDING  Incomplete  Culture, blood (routine x 2)     Status: None (Preliminary result)   Collection Time: 11/22/18 11:24 AM  Result Value Ref Range Status   Specimen Description   Final    BLOOD RIGHT ARM Performed at [facility], [place] [address], [place], [place] [address]    Special Requests   Final    BOTTLES DRAWN AEROBIC AND ANAEROBIC Blood Culture results may not be optimal due to an excessive volume of blood received in culture bottles Performed at [place] [address], [place], [place] [address]    Culture   Final    NO GROWTH < 24 HOURS  Performed at [place] [address] [place], [place] [address]    Report Status PENDING  Incomplete  Culture, blood (routine x 2)     Status: None (Preliminary result)   Collection Time: 11/22/18 11:24 AM  Result Value Ref Range Status   Specimen Description   Final    BLOOD RIGHT HAND Performed at [facility], [place] [address] [place], [place] [address]    Special Requests   Final    BOTTLES DRAWN AEROBIC ONLY Blood Culture adequate volume Performed at [place] [address] [place], [place] [address]    Culture   Final    NO GROWTH < 24 HOURS Performed at [place] [address] [place], [place] [address]    Report Status PENDING  Incomplete      Radiology Studies: Ct Abdomen Pelvis Wo Contrast  Result Date: 11/22/2018 CLINICAL DATA:  Initial evaluation for acute abdominal pain. Diverticulitis suspected. EXAM: CT ABDOMEN AND PELVIS WITHOUT CONTRAST TECHNIQUE: Multidetector CT imaging of the abdomen and pelvis was performed following the standard protocol without IV contrast. COMPARISON:  Prior CT from 09/18/2014. FINDINGS: Lower chest: Scattered subsegmental atelectatic changes and/or fibrosis noted within the visualized lung bases. 7 mm pleural base nodular density at the posterior right lung base (series 4, image 34), indeterminate. Visualized lungs are otherwise clear. Hepatobiliary: Limited noncontrast evaluation of the liver is unremarkable. Gallbladder normal. No biliary dilatation. Pancreas: Pancreas within normal limits. Spleen: Spleen within normal limits. Adrenals/Urinary Tract: Adrenal glands are normal. Kidneys equal in size without evidence for nephrolithiasis or hydronephrosis. No radiopaque calculi seen along the course of either renal collecting system. No hydroureter. Partially distended bladder within normal limits. Stomach/Bowel: Stomach mildly distended with enteric contrast material within the gastric lumen. No  evidence for bowel obstruction. Normal appendix. Colon largely decompressed. Mild circumferential wall thickening about the transverse colon, which could be related incomplete distension or possibly mild colitis. No other acute inflammatory changes about the bowels. Vascular/Lymphatic: Mild aorto bi-iliac atherosclerotic disease. No aneurysm. No adenopathy. Reproductive: Uterus is absent. Atrophic right ovary. 3.7 cm simple cyst present within the left ovary. Other: No free air or fluid. Musculoskeletal: No acute osseous finding. No discrete lytic or blastic osseous lesions. IMPRESSION: 1. Mild circumferential wall thickening about the transverse colon, which may be related incomplete distension or possibly mild colitis. 2. No other acute intra-abdominal or pelvic process. 3. 3.7 cm simple left ovarian cyst. Follow-up examination with dedicated pelvic ultrasound recommended for complete evaluation. 4. 7 mm pleural-based nodular density at the posterior right lung base, indeterminate. Non-contrast chest CT at 6-12 months is recommended. If the nodule is stable at time of repeat CT, then future CT at 18-24 months (from today's scan) is considered optional for low-risk patients, but is recommended for high-risk patients. This recommendation follows the consensus statement: Guidelines for Management of Incidental Pulmonary Nodules Detected on CT Images: From the Fleischner Society 2017; Radiology 2017; 284:228-243. Electronically Signed   By: Jeannine Boga M.D.   On: 11/22/2018 02:17   US Pelvis (transabdominal Only)  Result Date: 11/22/2018 CLINICAL DATA:  Left ovarian cysts seen on abdominal CT. Left lower quadrant pain for 3-4 weeks EXAM: TRANSABDOMINAL ULTRASOUND OF PELVIS TECHNIQUE: Transabdominal ultrasound examination of the pelvis was performed including evaluation of the uterus, ovaries, adnexal regions, and pelvic cul-de-sac. COMPARISON:  Abdominal CT from earlier today FINDINGS: Uterus Surgically  absent Right ovary Not visualized due to bowel.  No abnormality by CT. Left ovary Measurements: 4.6 x 4.5 x 3 cm = volume: 32 mL. There is an underlying 3.4 cm avascular anechoic cyst. Other findings:  No abnormal free fluid. IMPRESSION: 1. Simple 3.4 cm left ovarian cyst. One year follow-up pelvic ultrasound is recommended if appropriate for comorbidities. 2. The right ovary was not visualized sonographically but the right adnexa was normal by preceding CT. Electronically Signed   By: Monte Fantasia M.D.   On: 11/22/2018 08:11    Scheduled Meds: . Carbidopa-Levodopa ER  1 tablet Oral TID  . levothyroxine  25 mcg Oral QAC breakfast  . selegiline  5 mg Oral Daily   Continuous Infusions: . piperacillin-tazobactam (ZOSYN)  IV 3.375 g (11/23/18 0356)     LOS: 1 day   Assessment & Plan:   Principal Problem:  Acute colitis Active Problems:   Hypothyroidism   Parkinson's disease ([place])   Acute lower UTI   Transverse colitis : Abdominal pain improved.  Continue Zosyn and continue on clear liquids for possible need of colonoscopy as GI has been consulted now.  Leukocytosis slightly worsened today.  Rectal bleeding -only one episode on the day of admission.  Has had a sigmoidoscopy in 2013 Which showed hemorrhoids.  Last upper endoscopy was in 2018.  GI on board.  Defer management to them.  Parkinson's disease on carbidopa levodopa and selegiline.  Continue those medications.  Hypothyroidism on Synthroid.  Anxiety on PRN Xanax.  Simple left ovarian cyst:  Will need repeat pelvic ultrasound in a year.  8 mm pulmonary nodule: Will need repeat CT scan in 12 months.  Acute UTI: Urine culture growing Klebsiella pneumonia.  Susceptibilities pending.  Continue Zosyn and follow urine culture and tailor antibiotics accordingly.   DVT prophylaxis: SCD Code Status: Full code Family Communication: Plan of care discussed with the patient.  Patient did not want me to call any family members.  She  will call her husband to update. Disposition Plan: To be determined   Time spent: 69   Darliss Cheney, MD [facility] Pager [phone]  If 7PM-7AM, please contact night-coverage [email or web address] Password [facility] 11/23/2018, 12:14 PM

## 2018-11-24 DIAGNOSIS — Z885 Allergy status to narcotic agent status: Secondary | ICD-10-CM

## 2018-11-24 DIAGNOSIS — Z87440 Personal history of urinary (tract) infections: Secondary | ICD-10-CM

## 2018-11-24 DIAGNOSIS — K559 Vascular disorder of intestine, unspecified: Secondary | ICD-10-CM

## 2018-11-24 DIAGNOSIS — Z881 Allergy status to other antibiotic agents status: Secondary | ICD-10-CM

## 2018-11-24 DIAGNOSIS — Z886 Allergy status to analgesic agent status: Secondary | ICD-10-CM

## 2018-11-24 DIAGNOSIS — Z91048 Other nonmedicinal substance allergy status: Secondary | ICD-10-CM

## 2018-11-24 DIAGNOSIS — G20 Parkinson's disease: Secondary | ICD-10-CM

## 2018-11-24 DIAGNOSIS — Z91040 Latex allergy status: Secondary | ICD-10-CM

## 2018-11-24 DIAGNOSIS — D72829 Elevated white blood cell count, unspecified: Secondary | ICD-10-CM

## 2018-11-24 DIAGNOSIS — Z884 Allergy status to anesthetic agent status: Secondary | ICD-10-CM

## 2018-11-24 DIAGNOSIS — Z888 Allergy status to other drugs, medicaments and biological substances status: Secondary | ICD-10-CM

## 2018-11-24 DIAGNOSIS — Z91013 Allergy to seafood: Secondary | ICD-10-CM

## 2018-11-24 LAB — COMPREHENSIVE METABOLIC PANEL
ALT: 12 U/L (ref 0–44)
AST: 17 U/L (ref 15–41)
Albumin: 3 g/dL — ABNORMAL LOW (ref 3.5–5.0)
Alkaline Phosphatase: 68 U/L (ref 38–126)
Anion gap: 8 (ref 5–15)
BUN: 6 mg/dL — ABNORMAL LOW (ref 8–23)
CO2: 28 mmol/L (ref 22–32)
Calcium: 8.6 mg/dL — ABNORMAL LOW (ref 8.9–10.3)
Chloride: 105 mmol/L (ref 98–111)
Creatinine, Ser: 0.66 mg/dL (ref 0.44–1.00)
GFR calc Af Amer: >60 mL/min (ref >60)
GFR calc non Af Amer: >60 mL/min (ref >60)
Glucose, Bld: 93 mg/dL (ref 70–99)
Potassium: 3.3 mmol/L — ABNORMAL LOW (ref 3.5–5.1)
Sodium: 141 mmol/L (ref 135–145)
Total Bilirubin: 1.3 mg/dL — ABNORMAL HIGH (ref 0.3–1.2)
Total Protein: 5.9 g/dL — ABNORMAL LOW (ref 6.5–8.1)

## 2018-11-24 LAB — CBC WITH DIFFERENTIAL/PLATELET
Abs Immature Granulocytes: 0.13 10*3/uL — ABNORMAL HIGH (ref 0.00–0.07)
Basophils Absolute: 0.1 10*3/uL (ref 0.0–0.1)
Basophils Relative: 0 %
Eosinophils Absolute: 0.3 10*3/uL (ref 0.0–0.5)
Eosinophils Relative: 2 %
HCT: 41.7 % (ref 36.0–46.0)
Hemoglobin: 12.9 g/dL (ref 12.0–15.0)
Immature Granulocytes: 1 %
Lymphocytes Relative: 11 %
Lymphs Abs: 2.3 10*3/uL (ref 0.7–4.0)
MCH: 30 pg (ref 26.0–34.0)
MCHC: 30.9 g/dL (ref 30.0–36.0)
MCV: 97 fL (ref 80.0–100.0)
Monocytes Absolute: 1.7 10*3/uL — ABNORMAL HIGH (ref 0.1–1.0)
Monocytes Relative: 8 %
Neutro Abs: 16.3 10*3/uL — ABNORMAL HIGH (ref 1.7–7.7)
Neutrophils Relative %: 78 %
Platelets: 251 10*3/uL (ref 150–400)
RBC: 4.3 MIL/uL (ref 3.87–5.11)
RDW: 13.1 % (ref 11.5–15.5)
WBC: 20.9 10*3/uL — ABNORMAL HIGH (ref 4.0–10.5)
nRBC: 0 % (ref 0.0–0.2)

## 2018-11-24 LAB — URINE CULTURE: Culture: >=100000 — AB

## 2018-11-24 LAB — CK: Total CK: 29 U/L — ABNORMAL LOW (ref 38–234)

## 2018-11-24 LAB — LACTIC ACID, PLASMA: Lactic Acid, Venous: 0.7 mmol/L (ref 0.5–1.9)

## 2018-11-24 LAB — LACTATE DEHYDROGENASE: LDH: 112 U/L (ref 98–192)

## 2018-11-24 LAB — SARS CORONAVIRUS 2 BY RT PCR (HOSPITAL ORDER, PERFORMED IN ~~LOC~~ HOSPITAL LAB): SARS Coronavirus 2: NEGATIVE

## 2018-11-24 MED ORDER — POTASSIUM CHLORIDE CRYS ER 20 MEQ PO TBCR
40.0000 meq | EXTENDED_RELEASE_TABLET | Freq: Once | ORAL | Status: AC
  Administered 2018-11-24: 40 meq via ORAL
  Filled 2018-11-24: qty 2

## 2018-11-24 MED ORDER — LORAZEPAM 2 MG/ML IJ SOLN
1.0000 mg | Freq: Once | INTRAMUSCULAR | Status: AC
  Administered 2018-11-24: 1 mg via INTRAVENOUS
  Filled 2018-11-24: qty 1

## 2018-11-24 MED ORDER — LORAZEPAM BOLUS VIA INFUSION: 1.0000 mg | Freq: Once | INTRAVENOUS | Status: DC

## 2018-11-24 NOTE — Progress Notes (Signed)
PHARMACY NOTE -  [place] has been assisting with dosing of Zosyn for IAI.  Dosage remains stable at 3.375 g IV q8 hr and need for further dosage adjustment appears unlikely at present given stable renal function  Pharmacy will sign off, following peripherally for culture results or dose adjustments. Please reconsult if a change in clinical status warrants re-evaluation of dosage.  Reuel Boom, PharmD, BCPS [phone] 11/24/2018, 12:41 PM

## 2018-11-24 NOTE — Progress Notes (Addendum)
[facility] ROUNDING NOTE   Subjective: Tolerating soft diet No diarrhea or rectal bleeding She had mild lower abdominal cramping after she eat but not intense compared what she had prior to hospitalization   Objective: Vital signs in last 24 hours: Temp:  [98.3 F (36.8 C)-98.6 F (37 C)] 98.6 F (37 C) (05/03 0430) Pulse Rate:  [71-81] 76 (05/03 0430) Resp:  [14-18] 18 (05/03 0430) BP: (117-122)/(54-65) 122/65 (05/03 0430) SpO2:  [98 %-99 %] 98 % (05/03 0430) Last BM Date: 11/21/18 General: NAD Abdomen:soft, no distension, no rebound, no tenderness, BS+ Ext: No edema    Intake/Output from previous day: 05/02 0701 - 05/03 0700 In: 270 [IV Piggyback:270] Out: -  Intake/Output this shift: Total I/O In: 240 [P.O.:240] Out: -    Lab Results: Recent Labs    11/22/18 1126 11/23/18 0455 11/24/18 0600  WBC 16.2* 19.3* 20.9*  HGB 14.3 13.3 12.9  PLT 252 284 251  MCV 98.5 96.8 97.0   BMET Recent Labs    11/21/18 2300 11/23/18 0455 11/24/18 0600  NA 139 140 141  K 3.7 3.6 3.3*  CL 105 109 105  CO2 25 24 28   GLUCOSE 146* 108* 93  BUN 15 5* 6*  CREATININE 0.70 0.62 0.66  CALCIUM 9.2 8.3* 8.6*   LFT Recent Labs    11/21/18 2300 11/23/18 0455 11/24/18 0600  PROT 7.4 6.1* 5.9*  ALBUMIN 4.0 3.1* 3.0*  AST 22 20 17   ALT 5 11 12   ALKPHOS 77 63 68  BILITOT 0.9 1.7* 1.3*   PT/INR No results for input(s): INR in the last 72 hours.    Imaging/Other results: No results found.    Assessment &Plan  12 yr F with Parkinson's disease, hypothyroidism, Klebsiella UIT Admitted with lower abdominal pain and diarrhea with blood, which has since resolved. Mild transverse colon thickening on CT. Likely etiology mild ischemic colitis  Clinically colitis appears to be resolving and abdominal exam is benign  Continues to have persistent leucocytosis predominant neutrophils with left shift  SARS COV 2 negative  On Zosyn for Klebsiella UTI  If  no other source of infection, may consider repeat CT abd & pelvis with contrast.  Consider ID consult  Discussed above plan with patient and her daughter on phone  We will continue to follow      K. Denzil Magnuson , MD [phone]  [facility]

## 2018-11-24 NOTE — Consult Note (Signed)
[facility]       Reason for Consult: leukocytosis    Referring Physician: Dr. Doristine Bosworth  Principal Problem:   Acute colitis Active Problems:   Hypothyroidism   Parkinson's disease ([place])   Acute lower UTI   Ischemic colitis ([place])   Leukocytosis   . bismuth subsalicylate  30 mL Oral TID AC & HS  . Carbidopa-Levodopa ER  1 tablet Oral TID  . levothyroxine  25 mcg Oral QAC breakfast  . selegiline  5 mg Oral Daily    Recommendations: Stop Zosyn Continue supportive care   Assessment: She has ischemic colitis and leukocytosis.  Probably some exacerbation with recent cipro use with antibiotics-associated (non-C diff) diarrhea.  She has no signs of an active infection with no urinary complaints, no other symptoms.  I suspect the leukocytosis is reactive with ongoing non-infectious colitis.   No indication for antibiotics at this time. Will continue to monitor for new symptoms.  Antibiotic allergy - she reports a penicillin allergy but is tolerating pipercillin just fine. No pcn allergy listed anyway.    Dr. Tommy Medal on tomorrow  Antibiotics: Zosyn day 4  HPI: Tracy Malone is a 85 y.o. female with parkinson's disease and reported recurrent UTIs who presented to the hospital with ongoing suprapubic and abdominal pain for 3-4 weeks.  Not associated with n/v.  Work up in the hospital included a CT of her abdomen with mild colitis.  Had noted bloody diarrhea.  Pain associated with eating.  Has improved since admission.  Has had UTIs in the past but asymptomatic.  UA here done and culture sent though no dysuria, only some frequency and back pain she relates to having a UTI.  Previous UTI, again with just back pain and some frequency and given macrobid and then cipro.  After the cipro she felt her abdominal pain and diarrhea much worse.   Recently saw hematology due to persistent leukocytosis.  WBC now up to 20.9.   CT without pyelonephritis, no abscess.     Review of Systems:  Constitutional: negative for fevers and chills Respiratory: negative for cough Genitourinary: negative for frequency, dysuria at this time Integument/breast: negative for rash All other systems reviewed and are negative    Past Medical History:  Diagnosis Date  . Bell's palsy 1966   ? right side facial droop  . Carotid artery disease ([place]) 2010   on vascular screening;unchanged 2013.(could not tolerate simvastatin, no other statins tried)--<30% blockage bilat 07/2011  . Chronic abdominal pain   . Chronic fatigue and malaise   . Claustrophobia   . Depression    treated in the past for years;stopped in 2010 for a years  . Duodenal ulcer 1962   h/o  . Dysrhythmia    ocassional PVC's  . Fibromyalgia   . Frequent PVCs 07/2012   Seen by [facility]: benign, asymptomatic, normal EF  . GERD (gastroesophageal reflux disease)   . Glaucoma, narrow-angle    s/p laser surgery  . History of hiatal hernia    during endoscopy  . Hypothyroid 9/08  . IBS (irritable bowel syndrome)    Dr. Benson Norway  . Ocular migraine   . Osteoporosis 10/11   Dr.Hawkes  . Panic attack   . Recurrent UTI    has cystocele-Dr.Grewal  . Shingles 1999   h/o  . Superficial thrombophlebitis 03/2009   RLE  . Trochanteric bursitis 12/2008   bilateral    Social History   Tobacco Use  .  Smoking status: Never Smoker  . Smokeless tobacco: Never Used  Substance Use Topics  . Alcohol use: No    Alcohol/week: 0.0 standard drinks  . Drug use: No    Family History  Problem Relation Age of Onset  . Heart disease Mother   . Hypertension Mother   . Hypertension Sister   . HIV Son   . Heart disease Brother   . Lung cancer Brother        lung  . Diabetes Maternal Grandfather   . Diabetes Granddaughter        type 1    Allergies  Allergen Reactions  . Iodine Anaphylaxis    IV and topical forms.  Burnard Leigh [Hyoscyamine Sulfate]     Vision problems/pt has glaucoma  . Salmon [Fish Allergy]  Hives and Shortness Of Breath  . Shellfish Allergy Anaphylaxis  . Remeron [Mirtazapine] Other (See Comments)    Cause blurred vision and red eyes, pt has glaucoma  . Aspirin Other (See Comments)    Sever stomach pain due to ulcer scaring.  . Ciprofloxacin Diarrhea  . Codeine Nausea And Vomiting  . Darvocet [Propoxyphene N-Acetaminophen] Nausea And Vomiting  . Demerol [Meperidine] Nausea Only  . Dexilant [Dexlansoprazole] Swelling    Redness, swelling and peeling of both feet.  . Diphedryl [Diphenhydramine] Other (See Comments)    Increased pulse/small amount ok  . Doxycycline Hyclate Other (See Comments)    GI intolerance.  Marland Kitchen Epinephrine Other (See Comments)    Breathing problems  . Erythromycin Other (See Comments)    GI intolerance.  Yvette Rack [Cyclobenzaprine] Other (See Comments)    Tingly/prickly sensation.  Marland Kitchen Keflex [Cephalexin] Hives  . Latex Other (See Comments)    Gloves ok.  Skin gets red from elastic in underwear and latex bandaides.  . Nitrofurantoin Diarrhea  . Prednisone Other (See Comments)    Headache  . Pylera [Bis Subcit-Metronid-Tetracyc] Swelling    Tongue swelling. Face tingling  . Sulfa Antibiotics Other (See Comments)    Increased pulse, fainting, diarrhea, thrush  . Xylocaine [Lidocaine Hcl]     With epinephrine, given by dentist.  Speeded up heart rate and she passed out (occured twice, at dentist)  . Zoloft [Sertraline Hcl] Swelling and Other (See Comments)    Migraine Swelling of tongue/lip (09/2012)  . Advil [Ibuprofen] Other (See Comments)    Motrin ok with a GI effect.  . Clarithromycin Rash    Started after completing 10 day course of 2000 mg /day    Physical Exam: Constitutional: in no apparent distress  Vitals:   11/24/18 0430 11/24/18 1246  BP: 122/65 120/67  Pulse: 76 77  Resp: 18 16  Temp: 98.6 F (37 C) 98.6 F (37 C)  SpO2: 98% 96%   EYES: anicteric ENMT: no thrush Cardiovascular: Cor RRR Respiratory: CTA B; normal  respriatory effort GI: soft, no guarding, some tenderness with palpation on left side, normal bowel sounds Musculoskeletal: no pedal edema noted Skin: negatives: no rash Neuro: non-focal  Lab Results  Component Value Date   WBC 20.9 (H) 11/24/2018   HGB 12.9 11/24/2018   HCT 41.7 11/24/2018   MCV 97.0 11/24/2018   PLT 251 11/24/2018    Lab Results  Component Value Date   CREATININE 0.66 11/24/2018   BUN 6 (L) 11/24/2018   NA 141 11/24/2018   K 3.3 (L) 11/24/2018   CL 105 11/24/2018   CO2 28 11/24/2018    Lab Results  Component Value Date  ALT 12 11/24/2018   AST 17 11/24/2018   ALKPHOS 68 11/24/2018     Microbiology: Recent Results (from the past 240 hour(s))  Urine culture     Status: Abnormal   Collection Time: 11/21/18  9:57 PM  Result Value Ref Range Status   Specimen Description   Final    URINE, RANDOM Performed at [facility] [address] [place], [place] [address]    Special Requests   Final    NONE Performed at [facility], [place] [address], [place], [place] [address]    Culture >=100,000 COLONIES/mL KLEBSIELLA PNEUMONIAE (A)  Final   Report Status 11/24/2018 FINAL  Final   Organism ID, Bacteria KLEBSIELLA PNEUMONIAE (A)  Final      Susceptibility   Klebsiella pneumoniae - MIC*    AMPICILLIN >=32 RESISTANT Resistant     CEFAZOLIN <=4 SENSITIVE Sensitive     CEFTRIAXONE <=1 SENSITIVE Sensitive     CIPROFLOXACIN <=0.25 SENSITIVE Sensitive     GENTAMICIN <=1 SENSITIVE Sensitive     IMIPENEM <=0.25 SENSITIVE Sensitive     NITROFURANTOIN 64 INTERMEDIATE Intermediate     TRIMETH/SULFA <=20 SENSITIVE Sensitive     AMPICILLIN/SULBACTAM 4 SENSITIVE Sensitive     PIP/TAZO <=4 SENSITIVE Sensitive     Extended ESBL NEGATIVE Sensitive     * >=100,000 COLONIES/mL KLEBSIELLA PNEUMONIAE  Culture, blood (routine x 2)     Status: None (Preliminary result)   Collection Time: 11/22/18 11:24 AM  Result Value Ref Range Status    Specimen Description   Final    BLOOD RIGHT ARM Performed at [facility], [address] [place], [place] [address]    Special Requests   Final    BOTTLES DRAWN AEROBIC AND ANAEROBIC Blood Culture results may not be optimal due to an excessive volume of blood received in culture bottles Performed at [facility] [address], [place], [place] [address]    Culture   Final    NO GROWTH 2 DAYS Performed at [facility] [address], [place], [place] [address]    Report Status PENDING  Incomplete  Culture, blood (routine x 2)     Status: None (Preliminary result)   Collection Time: 11/22/18 11:24 AM  Result Value Ref Range Status   Specimen Description   Final    BLOOD RIGHT HAND Performed at [facility], [place] [address], [place], [place] [address]    Special Requests   Final    BOTTLES DRAWN AEROBIC ONLY Blood Culture adequate volume Performed at [facility] [address], [place], [place] [address]    Culture   Final    NO GROWTH 2 DAYS Performed at [facility] [address] [place], [place] [address]    Report Status PENDING  Incomplete  SARS Coronavirus 2 [facility] order, Performed in [facility] hospital lab)     Status: None   Collection Time: 11/24/18  9:15 AM  Result Value Ref Range Status   SARS Coronavirus 2 NEGATIVE NEGATIVE Final    Comment: (NOTE) If result is NEGATIVE SARS-CoV-2 target nucleic acids are NOT DETECTED. The SARS-CoV-2 RNA is generally detectable in upper and lower  respiratory specimens during the acute phase of infection. The lowest  concentration of SARS-CoV-2 viral copies this assay can detect is 250  copies / mL. A negative result does not preclude SARS-CoV-2 infection  and should not be used as the  sole basis for treatment or other  patient management decisions.  A negative result may occur with  improper specimen collection / handling, submission of specimen other   than nasopharyngeal swab, presence of viral mutation(s) within the  areas targeted by this assay, and inadequate number of viral copies  (<250 copies / mL). A negative result must be combined with clinical  observations, patient history, and epidemiological information. If result is POSITIVE SARS-CoV-2 target nucleic acids are DETECTED. The SARS-CoV-2 RNA is generally detectable in upper and lower  respiratory specimens dur ing the acute phase of infection.  Positive  results are indicative of active infection with SARS-CoV-2.  Clinical  correlation with patient history and other diagnostic information is  necessary to determine patient infection status.  Positive results do  not rule out bacterial infection or co-infection with other viruses. If result is PRESUMPTIVE POSTIVE SARS-CoV-2 nucleic acids MAY BE PRESENT.   A presumptive positive result was obtained on the submitted specimen  and confirmed on repeat testing.  While 2019 novel coronavirus  (SARS-CoV-2) nucleic acids may be present in the submitted sample  additional confirmatory testing may be necessary for epidemiological  and / or clinical management purposes  to differentiate between  SARS-CoV-2 and other Sarbecovirus currently known to infect humans.  If clinically indicated additional testing with an alternate test  methodology [phone]) is advised. The SARS-CoV-2 RNA is generally  detectable in upper and lower respiratory sp ecimens during the acute  phase of infection. The expected result is Negative. Fact Sheet for Patients:  StrictlyIdeas.no Fact Sheet for Healthcare Providers: BankingDealers.co.za This test is not yet approved or cleared by the [place] FDA and has been authorized for detection and/or diagnosis of SARS-CoV-2 by FDA under an Emergency Use Authorization (EUA).  This EUA will remain in effect (meaning this test can be used) for the duration of the  COVID-19 declaration under Section 564(b)(1) of the Act, 21 U.S.C. section 360bbb-3(b)(1), unless the authorization is terminated or revoked sooner. Performed at [facility], [place] [address], [place], [place] [address]     Jaydin Boniface W Mileena Rothenberger, MD [facility] for Infectious Disease [facility] [email or web address] 11/24/2018, 4:08 PM

## 2018-11-24 NOTE — Progress Notes (Signed)
Physical Therapy Treatment Patient Details Name: Tracy Malone MRN: [id number] DOB: 08-22-1931 Today's Date: 11/24/2018    History of Present Illness 85 yo female admitted to ED on 4/30 with abdominal pain, CT revealing colitis. Pt also with UTI. PMH includes CAD, depression, fibromyalgia, PVCs, IBS, migraines, OP, Parkinson's disease.    PT Comments    Pt progressing toward goals; incr amb distance/tolerance today. amb ~ 380' with RW progressing  to no device, min/guard to close supervision for safety    Follow Up Recommendations  No PT follow up;Supervision for mobility/OOB     Equipment Recommendations  None recommended by PT    Recommendations for Other Services       Precautions / Restrictions Precautions Precautions: Fall Restrictions Weight Bearing Restrictions: No    Mobility  Bed Mobility Overal bed mobility: Needs Assistance Bed Mobility: Supine to Sit;Sit to Supine     Supine to sit: Supervision Sit to supine: Min guard   General bed mobility comments: incr time, supervision for safety to sit, min/guard to elevate LEs onto bed  Transfers Overall transfer level: Needs assistance Equipment used: None;Rolling walker (2 wheeled) Transfers: Sit to/from Stand Sit to Stand: Min guard(from bed, toilet)         General transfer comment: min-guard for safety, unsteady on initial stand  Ambulation/Gait Ambulation/Gait assistance: Supervision;Min guard Gait Distance (Feet): 380 Feet Assistive device: Rolling walker (2 wheeled);None Gait Pattern/deviations: Step-through pattern;Decreased stride length;Festinating;Drifts right/left     General Gait Details: close supervision to min/guard for balance and safety; pt wanted to amb with RW however with incr distance agreed to amb without AD; pt with mildly festinating gait initially, improved with distance; unsteady although no overt LOB   Stairs             Wheelchair Mobility    Modified Rankin  (Stroke Patients Only)       Balance Overall balance assessment: Needs assistance Sitting-balance support: No upper extremity supported;Feet supported Sitting balance-Leahy Scale: Good     Standing balance support: During functional activity;No upper extremity supported Standing balance-Leahy Scale: Fair               High level balance activites: Direction changes;Turns;Head turns;Backward walking High Level Balance Comments: min/guard for safety with above, no overt LOB, unable to tolerate mod challenges without LOB            Cognition Arousal/Alertness: Awake/alert Behavior During Therapy: WFL for tasks assessed/performed Overall Cognitive Status: Within Functional Limits for tasks assessed                                        Exercises      General Comments        Pertinent Vitals/Pain Pain Assessment: No/denies pain    Home Living                      Prior Function            PT Goals (current goals can now be found in the care plan section) Acute Rehab PT Goals Patient Stated Goal: go home to husband  PT Goal Formulation: With patient Time For Goal Achievement: 12/06/18 Potential to Achieve Goals: Good Progress towards PT goals: Progressing toward goals    Frequency           PT Plan Current plan remains appropriate    Co-evaluation  AM-PAC PT "6 Clicks" Mobility   Outcome Measure  Help needed turning from your back to your side while in a flat bed without using bedrails?: A Little Help needed moving from lying on your back to sitting on the side of a flat bed without using bedrails?: A Little Help needed moving to and from a bed to a chair (including a wheelchair)?: A Little Help needed standing up from a chair using your arms (e.g., wheelchair or bedside chair)?: A Little Help needed to walk in hospital room?: A Little Help needed climbing 3-5 steps with a railing? : A Little 6 Click  Score: 18    End of Session Equipment Utilized During Treatment: Gait belt Activity Tolerance: Patient tolerated treatment well Patient left: in bed;with call bell/phone within reach;with bed alarm set Nurse Communication: Mobility status PT Visit Diagnosis: Other abnormalities of gait and mobility (R26.89);Pain     Time: 1414-1440 PT Time Calculation (min) (ACUTE ONLY): 26 min  Charges:  $Gait Training: 23-37 mins                     Kenyon Ana, PT  Pager: [phone] Acute Rehab Dept [facility]): [phone]   11/24/2018    [facility] 11/24/2018, 2:50 PM

## 2018-11-24 NOTE — Progress Notes (Addendum)
PROGRESS NOTE    Tracy Malone  OYD:[id number] DOB: 21-Jun-1932 DOA: 11/21/2018 PCP: [name], MD (Confirm with patient/family/NH records and if not entered, this HAS to be entered at [facility] point of entry. "No PCP" if truly none.)   Brief Narrative:  Tracy Malone is a 85 y.o. female with history of Parkinson's disease and hypothyroidism has been experiencing suprapubic and left lower quadrant pain for last 3 to 4 weeks.  Denied any nausea vomiting.  Has chronic constipation.  Patient also had visited OB/GYN about 2 weeks ago and was told that her pain is likely related to the colon and not related to bladder or uterine.  Patient was referred to Dr. Ardis Hughs gastroenterologist which patient is here to follow.  Patient also had followed up with hematologist because of weakness. Yesterday patient had increasing pain and also for the first time she noticed some blood in the stools so she came to ER.  In the ER patient was afebrile and hemodynamically stable.  CT scan of the abdomen and pelvis done showed colon thickening around the transverse colon concerning for colitis and UA is consistent with UTI.  CT abdomen also showed left simple ovarian cyst which was verified by pelvic ultrasound. Patient was started on empiric antibiotics of IV Zosyn and IV fluids and admitted under hospitalist service.  Per patient's request, GI was consulted however they do not plan on any colonoscopy and patient does not want any either.  She continues to have leukocytosis.  Has intermittent abdominal pain.  Urine culture growing Klebsiella pneumonia and she continues to be on Zosyn due to the fact that she has allergies to ciprofloxacin, cephalosporins and sulfa antibiotics.  Consultants:   GI  Procedures:   None  Antimicrobials:   Zosyn started on 11/21/2018   Subjective: Patient seen and examined.  She states that she felt better yesterday when she was seen by GI however 30 minutes later, she started having  abdominal pain and although her diet was advanced to soft diet but she never tried anything.  She has intermittent abdominal pain but overall feels better than when she came in.  Objective: Vitals:   11/23/18 0640 11/23/18 1458 11/23/18 2032 11/24/18 0430  BP: (!) 130/56 (!) 119/54 (!) 117/57 122/65  Pulse: 65 71 81 76  Resp: 18 14 18 18   Temp: 98.7 F (37.1 C) 98.3 F (36.8 C) 98.5 F (36.9 C) 98.6 F (37 C)  TempSrc: Oral Oral Oral Oral  SpO2: 97% 99% 99% 98%  Weight: 50.5 kg     Height:        Intake/Output Summary (Last 24 hours) at 11/24/2018 0926 Last data filed at 11/24/2018 0542 Gross per 24 hour  Intake 269.97 ml  Output -  Net 269.97 ml   Filed Weights   11/22/18 0331 11/22/18 0510 11/23/18 0640  Weight: 48.5 kg 49.6 kg 50.5 kg    Examination:  General exam: Appears comfortable but anxious. Respiratory system: Clear to auscultation. Respiratory effort normal. Cardiovascular system: S1 & S2 heard, RRR. No JVD, murmurs, rubs, gallops or clicks. No pedal edema. Gastrointestinal system: Abdomen is nondistended, very mild tenderness in the right lower quadrant, no organomegaly or masses felt. Normal bowel sounds heard. Central nervous system: Alert and oriented. No focal neurological deficits. Extremities: Symmetric 5 x 5 power.  Pill-rolling movements of right upper extremity Skin: No rashes, lesions or ulcers Psychiatry: Judgement and insight appear normal. Mood & affect appropriate.    Data Reviewed: I have  personally reviewed following labs and imaging studies  CBC: Recent Labs  Lab 11/21/18 2300 11/22/18 0447 11/22/18 0722 11/22/18 1126 11/23/18 0455 11/24/18 0600  WBC 20.8* 16.5* 15.2* 16.2* 19.3* 20.9*  NEUTROABS 18.8*  --   --   --  14.7* 16.3*  HGB 14.7 14.8 13.9 14.3 13.3 12.9  HCT 47.4* 46.8* 45.4 45.8 42.7 41.7  MCV 97.7 96.9 98.3 98.5 96.8 97.0  PLT 288 257 291 252 284 494   Basic Metabolic Panel: Recent Labs  Lab 11/21/18 2300 11/23/18  0455 11/24/18 0600  NA 139 140 141  K 3.7 3.6 3.3*  CL 105 109 105  CO2 25 24 28   GLUCOSE 146* 108* 93  BUN 15 5* 6*  CREATININE 0.70 0.62 0.66  CALCIUM 9.2 8.3* 8.6*   GFR: Estimated Creatinine Clearance: 35.1 mL/min (by C-G formula based on SCr of 0.66 mg/dL). Liver Function Tests: Recent Labs  Lab 11/21/18 2300 11/23/18 0455 11/24/18 0600  AST 22 20 17   ALT 5 11 12   ALKPHOS 77 63 68  BILITOT 0.9 1.7* 1.3*  PROT 7.4 6.1* 5.9*  ALBUMIN 4.0 3.1* 3.0*   Recent Labs  Lab 11/21/18 2300  LIPASE 44   No results for input(s): AMMONIA in the last 168 hours. Coagulation Profile: No results for input(s): INR, PROTIME in the last 168 hours. Cardiac Enzymes: No results for input(s): CKTOTAL, CKMB, CKMBINDEX, TROPONINI in the last 168 hours. BNP (last 3 results) No results for input(s): PROBNP in the last 8760 hours. HbA1C: No results for input(s): HGBA1C in the last 72 hours. CBG: No results for input(s): GLUCAP in the last 168 hours. Lipid Profile: No results for input(s): CHOL, HDL, LDLCALC, TRIG, CHOLHDL, LDLDIRECT in the last 72 hours. Thyroid Function Tests: No results for input(s): TSH, T4TOTAL, FREET4, T3FREE, THYROIDAB in the last 72 hours. Anemia Panel: No results for input(s): VITAMINB12, FOLATE, FERRITIN, TIBC, IRON, RETICCTPCT in the last 72 hours. Sepsis Labs: Recent Labs  Lab 11/22/18 0722 11/22/18 1406 11/22/18 1952 11/24/18 0805  LATICACIDVEN 2.3* 2.7* 2.4* 0.7    Recent Results (from the past 240 hour(s))  Urine culture     Status: Abnormal   Collection Time: 11/21/18  9:57 PM  Result Value Ref Range Status   Specimen Description   Final    URINE, RANDOM Performed at [facility], [place] [address], [place], [place] [address]    Special Requests   Final    NONE Performed at [facility], [place] [address] [place], [place] [address]    Culture >=100,000 COLONIES/mL KLEBSIELLA PNEUMONIAE (A)  Final   Report  Status 11/24/2018 FINAL  Final   Organism ID, Bacteria KLEBSIELLA PNEUMONIAE (A)  Final      Susceptibility   Klebsiella pneumoniae - MIC*    AMPICILLIN >=32 RESISTANT Resistant     CEFAZOLIN <=4 SENSITIVE Sensitive     CEFTRIAXONE <=1 SENSITIVE Sensitive     CIPROFLOXACIN <=0.25 SENSITIVE Sensitive     GENTAMICIN <=1 SENSITIVE Sensitive     IMIPENEM <=0.25 SENSITIVE Sensitive     NITROFURANTOIN 64 INTERMEDIATE Intermediate     TRIMETH/SULFA <=20 SENSITIVE Sensitive     AMPICILLIN/SULBACTAM 4 SENSITIVE Sensitive     PIP/TAZO <=4 SENSITIVE Sensitive     Extended ESBL NEGATIVE Sensitive     * >=100,000 COLONIES/mL KLEBSIELLA PNEUMONIAE  Culture, blood (routine x 2)     Status: None (Preliminary result)   Collection Time: 11/22/18 11:24 AM  Result Value Ref Range  Status   Specimen Description   Final    BLOOD RIGHT ARM Performed at [facility], [place] [address] [place], [place] [address]    Special Requests   Final    BOTTLES DRAWN AEROBIC AND ANAEROBIC Blood Culture results may not be optimal due to an excessive volume of blood received in culture bottles Performed at [place] [address], [place], [place] [address]    Culture   Final    NO GROWTH 2 DAYS Performed at [facility] [address], [place], [place] [address]    Report Status PENDING  Incomplete  Culture, blood (routine x 2)     Status: None (Preliminary result)   Collection Time: 11/22/18 11:24 AM  Result Value Ref Range Status   Specimen Description   Final    BLOOD RIGHT HAND Performed at [facility], [place] [address] [place], [place] [address]    Special Requests   Final    BOTTLES DRAWN AEROBIC ONLY Blood Culture adequate volume Performed at [place] [address], [place], [place] [address]    Culture   Final    NO GROWTH 2 DAYS Performed at [facility] [place] [address], [place], [place] [address]    Report Status PENDING   Incomplete      Radiology Studies: No results found.  Scheduled Meds: . bismuth subsalicylate  30 mL Oral TID AC & HS  . Carbidopa-Levodopa ER  1 tablet Oral TID  . levothyroxine  25 mcg Oral QAC breakfast  . potassium chloride  40 mEq Oral Once  . selegiline  5 mg Oral Daily   Continuous Infusions: . piperacillin-tazobactam (ZOSYN)  IV 3.375 g (11/24/18 0405)     LOS: 2 days   Assessment & Plan:   Principal Problem:   Acute colitis Active Problems:   Hypothyroidism   Parkinson's disease ([place])   Acute lower UTI   Transverse colitis : Abdominal pain waxing and waning.  Continue on soft diet and advance as tolerated.  Also came in with lactic acidosis which was a stable.  Rechecking it today.  Discussed case with GI Dr. Silverio Decamp and requested to recommend appropriate oral antibiotics in her case when she has allergies to several antibiotics.  We will also check for COVID 19 per GI recommendations.  Rectal bleeding -only one episode on the day of admission.  Has had a sigmoidoscopy in 2013 Which showed hemorrhoids.  Last upper endoscopy was in 2018.  GI on board.  Defer management to them.  Parkinson's disease on carbidopa levodopa and selegiline.  Continue those medications.  Hypothyroidism on Synthroid.  Anxiety on PRN Xanax.  Simple left ovarian cyst:  Will need repeat pelvic ultrasound in a year.  8 mm pulmonary nodule: Will need repeat CT scan in 12 months.  Acute UTI: Urine culture growing Klebsiella pneumonia, not ESBL.  Sensitive to Zosyn.  Continue Zosyn since she is allergic to cephalosporins, fluoroquinolones and sulfa antibiotics.  Hypokalemia: 3.3 today.  Will replace orally and recheck in the morning.  Persistent and worsening leukocytosis: Upon extensive chart review, it seems like patient has a history of leukocytosis in the past and now she has persistent and worsening albeit slowly leukocytosis.  Low suspicion for CML however we will check basic labs  such as LDH and peripheral smear.  If indicated we will consider consulting hematology.  DVT prophylaxis: SCD Code Status: Full code Family Communication: Plan of  care discussed with the patient.  I also called patient's husband Mr. Tracy Malone at [phone] and answered all the questions. Disposition Plan: To be determined   Time spent: 30 min   Darliss Cheney, MD [facility] Pager [phone]  If 7PM-7AM, please contact night-coverage [email or web address] Password [email or web address] 11/24/2018, 9:26 AM

## 2018-11-25 ENCOUNTER — Encounter: Payer: Self-pay | Admitting: Gastroenterology

## 2018-11-25 ENCOUNTER — Ambulatory Visit (INDEPENDENT_AMBULATORY_CARE_PROVIDER_SITE_OTHER): Payer: Medicare Other | Admitting: Gastroenterology

## 2018-11-25 VITALS — Ht 59.0 in | Wt 108.0 lb

## 2018-11-25 DIAGNOSIS — Z5329 Procedure and treatment not carried out because of patient's decision for other reasons: Secondary | ICD-10-CM

## 2018-11-25 DIAGNOSIS — Z880 Allergy status to penicillin: Secondary | ICD-10-CM

## 2018-11-25 DIAGNOSIS — Z91199 Patient's noncompliance with other medical treatment and regimen due to unspecified reason: Secondary | ICD-10-CM

## 2018-11-25 LAB — COMPREHENSIVE METABOLIC PANEL
ALT: 12 U/L (ref 0–44)
AST: 22 U/L (ref 15–41)
Albumin: 2.9 g/dL — ABNORMAL LOW (ref 3.5–5.0)
Alkaline Phosphatase: 63 U/L (ref 38–126)
Anion gap: 6 (ref 5–15)
BUN: 8 mg/dL (ref 8–23)
CO2: 27 mmol/L (ref 22–32)
Calcium: 8.5 mg/dL — ABNORMAL LOW (ref 8.9–10.3)
Chloride: 107 mmol/L (ref 98–111)
Creatinine, Ser: 0.61 mg/dL (ref 0.44–1.00)
GFR calc Af Amer: >60 mL/min (ref >60)
GFR calc non Af Amer: >60 mL/min (ref >60)
Glucose, Bld: 99 mg/dL (ref 70–99)
Potassium: 4 mmol/L (ref 3.5–5.1)
Sodium: 140 mmol/L (ref 135–145)
Total Bilirubin: 0.9 mg/dL (ref 0.3–1.2)
Total Protein: 6.1 g/dL — ABNORMAL LOW (ref 6.5–8.1)

## 2018-11-25 LAB — CBC WITH DIFFERENTIAL/PLATELET
Abs Immature Granulocytes: 0.11 10*3/uL — ABNORMAL HIGH (ref 0.00–0.07)
Basophils Absolute: 0.1 10*3/uL (ref 0.0–0.1)
Basophils Relative: 1 %
Eosinophils Absolute: 0.6 10*3/uL — ABNORMAL HIGH (ref 0.0–0.5)
Eosinophils Relative: 3 %
HCT: 41.1 % (ref 36.0–46.0)
Hemoglobin: 12.9 g/dL (ref 12.0–15.0)
Immature Granulocytes: 1 %
Lymphocytes Relative: 17 %
Lymphs Abs: 3 10*3/uL (ref 0.7–4.0)
MCH: 30.5 pg (ref 26.0–34.0)
MCHC: 31.4 g/dL (ref 30.0–36.0)
MCV: 97.2 fL (ref 80.0–100.0)
Monocytes Absolute: 1.5 10*3/uL — ABNORMAL HIGH (ref 0.1–1.0)
Monocytes Relative: 9 %
Neutro Abs: 12.3 10*3/uL — ABNORMAL HIGH (ref 1.7–7.7)
Neutrophils Relative %: 69 %
Platelets: 251 10*3/uL (ref 150–400)
RBC: 4.23 MIL/uL (ref 3.87–5.11)
RDW: 13.1 % (ref 11.5–15.5)
WBC: 17.5 10*3/uL — ABNORMAL HIGH (ref 4.0–10.5)
nRBC: 0 % (ref 0.0–0.2)

## 2018-11-25 MED ORDER — LACTULOSE 10 GM/15ML PO SOLN
30.0000 g | Freq: Once | ORAL | Status: AC
  Administered 2018-11-25: 30 g via ORAL
  Filled 2018-11-25: qty 45

## 2018-11-25 MED ORDER — OXYCODONE-ACETAMINOPHEN 5-325 MG PO TABS
1.0000 | ORAL_TABLET | Freq: Four times a day (QID) | ORAL | Status: DC | PRN
  Administered 2018-11-25: 1 via ORAL
  Filled 2018-11-25: qty 1

## 2018-11-25 MED ORDER — DICYCLOMINE HCL 10 MG PO CAPS
10.0000 mg | ORAL_CAPSULE | Freq: Four times a day (QID) | ORAL | Status: DC | PRN
  Administered 2018-11-25: 10 mg via ORAL
  Filled 2018-11-25: qty 1

## 2018-11-25 MED ORDER — DICYCLOMINE HCL 10 MG PO CAPS: 10.0000 mg | ORAL_CAPSULE | Freq: Four times a day (QID) | ORAL | 0 refills | Status: DC | PRN

## 2018-11-25 MED ORDER — ONDANSETRON 4 MG PO TBDP: 4.0000 mg | ORAL_TABLET | Freq: Three times a day (TID) | ORAL | 0 refills | Status: DC | PRN

## 2018-11-25 MED ORDER — DICYCLOMINE HCL 10 MG PO CAPS: 10.0000 mg | ORAL_CAPSULE | Freq: Three times a day (TID) | ORAL | Status: DC

## 2018-11-25 NOTE — Progress Notes (Signed)
[facility] ROUNDING NOTE   Subjective: No acute events overnight. Lower abdominal discomfort again this morning. Tolerating PO intake. No BM in 3+ days. Seen by ID yesterday and Abx d/c-ed. WBC downtrending to 17.5 from 20.9.   Objective: Vital signs in last 24 hours: Temp:  [97.8 F (36.6 C)-98.6 F (37 C)] 97.8 F (36.6 C) (05/04 0541) Pulse Rate:  [74-81] 74 (05/04 0541) Resp:  [16-18] 18 (05/04 0541) BP: (106-120)/(43-67) 107/43 (05/04 0541) SpO2:  [94 %-96 %] 94 % (05/04 0541) Last BM Date: 11/21/18 General: NAD Lungs: CTA b/l, no w/r/r Heart: RRR, no m/r/g Abdomen: Soft, lower abdominal TTP without rebound or guarding Ext: No c/c/e   Intake/Output from previous day: 05/03 0701 - 05/04 0700 In: 530 [P.O.:480; IV Piggyback:50] Out: -  Intake/Output this shift: No intake/output data recorded.   Lab Results: Recent Labs    11/23/18 0455 11/24/18 0600 11/25/18 0505  WBC 19.3* 20.9* 17.5*  HGB 13.3 12.9 12.9  PLT 284 251 251  MCV 96.8 97.0 97.2   BMET Recent Labs    11/23/18 0455 11/24/18 0600 11/25/18 0505  NA 140 141 140  K 3.6 3.3* 4.0  CL 109 105 107  CO2 24 28 27   GLUCOSE 108* 93 99  BUN 5* 6* 8  CREATININE 0.62 0.66 0.61  CALCIUM 8.3* 8.6* 8.5*   LFT Recent Labs    11/23/18 0455 11/24/18 0600 11/25/18 0505  PROT 6.1* 5.9* 6.1*  ALBUMIN 3.1* 3.0* 2.9*  AST 20 17 22   ALT 11 12 12   ALKPHOS 63 68 63  BILITOT 1.7* 1.3* 0.9   PT/INR No results for input(s): INR in the last 72 hours.    Imaging/Other results: No results found.    Assessment and Recommendations:  85 yo female with hx of Parkinson's Disease, hypothyroidism, admitted with lower abdominal pain and diarrhea, with admission CT n/f mild transverse colonic wall thickening, clinically c/w mild Ischemic Colitis. Diarrhea resolved (no BM in 3+ days now) and abdominal pain improved, but not resolved. Was treated with Zosyn for Kleb  UTI, which was d/c-ed yesterday.    1) Ischemic Coltis: Resolving 2) Lower Abdominal Pain: - Intermittent cramping-type pain. No clear exacerbating factors. Suspect 2/2 the Ischemic Colitis.  - Trial course of Bentyl to use prn abdominal pain - Agree with trial of laxative for some clinical constipation. Baseline bowel habits is 1 BM QOD. No previous need for laxatives/stool softeners prior to admission, so explained that goal is for short term use of these agents until back to baseline.   3) Leukocytosis: - Dowtrending WBC. Agree that the elevation likely exacerbation of baseline elevated WBC 2/2 colitis as above, but now improving.  - Abx d/c-ed per ID recs - Outpatient Heme w/u recently for elevated WBC (13.6). Ongoing eval per primary Hospitalist service and outpatient Hematology  GI Service will sign off at this time. However, please do not hesitate to contact with additional questions or concerns.      Lavena Bullion, DO  11/25/2018, 9:33 AM [facility] Pager [phone]

## 2018-11-25 NOTE — Progress Notes (Signed)
Pt being discharged. Discharge instructions were reviewed with the pt. No questions or concerns at this time.

## 2018-11-25 NOTE — Discharge Summary (Signed)
Physician Discharge Summary  ELIAH OZAWA VZD:[id number] DOB: [birth year] DOA: 11/21/2018  PCP: [name], MD  Admit date: 11/21/2018 Discharge date: 11/25/2018  Admitted From: Home Disposition: Home  Recommendations for Outpatient Follow-up:  1. Follow up with PCP in 1-2 weeks 2. Please obtain BMP/CBC in one week 3. Please follow up on the following pending results:  Home Health: Yes Equipment/Devices: None  Discharge Condition: Stable CODE STATUS: Full code Diet recommendation: Regular diet  Subjective: Patient seen 85.  She feels overall better but still with intermittent abdominal pain which is improving in intensity.  No other complaint.  Tolerating diet.  Brief/Interim Summary: Tracy Malone a 85 y.o.femalewithhistory of Parkinson's disease and hypothyroidism has been experiencing suprapubic and left lower quadrant pain for last 3 to 4 weeks. Denied any nausea vomiting. Has chronic constipation. Patient also had visited OB/GYN about 2 weeks ago and was told that her pain is likely related to the colon and not related to bladder or uterine. Patient was referred to Dr. Ardis Hughs gastroenterologist which patient is here to follow. Patient also had followed up with hematologist because of weakness.  Yesterday patient had increasing pain on 11/21/2018 and also for the first time she noticed some blood in the stools so she came to ER on 11/22/2018.  In the ER patient was afebrile and hemodynamically stable. CT scan of the abdomen and pelvis done showed colon thickening around the transverse colon concerning for colitis and UA is consistent with UTI.  CT abdomen also showed left simple ovarian cyst which was verified by pelvic ultrasound.Patient was started on empiric antibiotics of IV Zosyn and IV fluids and admitted under hospitalist service.  Per patient's request, GI was consulted however they do not plan on any colonoscopy and patient did not want any either.  She continues to  have leukocytosis with intermittent abdominal pain so per GI recommendations, ID was consulted and they discontinued her antibiotics and according to them, her leukocytosis were secondary to acute ischemic colitis.  Since discontinuation of antibiotics yesterday, her leukocytosis has in fact improved.  She has improved as well.  She is tolerating regular diet now.  GI has seen her again and they have recommended Bentyl as needed for abdominal pain.  I have prescribed that as well as PRN Zofran ODT.  She will resume all her home medications.  She will follow with PCP next week and she will follow up with her gastroenterologist in 1 to 2 weeks.  Patient is agreeable to the plan of care for the discharge and so is her husband who I spoke to over the phone.  Discharge Diagnoses:  Active Problems:   Hypothyroidism   Parkinson's disease ([place])   Acute lower UTI   Ischemic colitis ([place])   Leukocytosis  Discharge Instructions  Discharge Instructions    Discharge patient   Complete by:  As directed    Discharge disposition:  01-Home or Self Care   Discharge patient date:  11/25/2018     Allergies as of 11/25/2018      Reactions   Iodine Anaphylaxis   IV and topical forms.   Levsin [hyoscyamine Sulfate]    Vision problems/pt has glaucoma   Salmon [fish Allergy] Hives, Shortness Of Breath   Shellfish Allergy Anaphylaxis   Remeron [mirtazapine] Other (See Comments)   Cause blurred vision and red eyes, pt has glaucoma   Aspirin Other (See Comments)   Sever stomach pain due to ulcer scaring.   Ciprofloxacin Diarrhea   Codeine  Nausea And Vomiting   Darvocet [propoxyphene N-acetaminophen] Nausea And Vomiting   Demerol [meperidine] Nausea Only   Dexilant [dexlansoprazole] Swelling   Redness, swelling and peeling of both feet.   Diphedryl [diphenhydramine] Other (See Comments)   Increased pulse/small amount ok   Doxycycline Hyclate Other (See Comments)   GI intolerance.   Epinephrine Other (See  Comments)   Breathing problems   Erythromycin Other (See Comments)   GI intolerance.   Flexeril [cyclobenzaprine] Other (See Comments)   Tingly/prickly sensation.   Keflex [cephalexin] Hives   Latex Other (See Comments)   Gloves ok.  Skin gets red from elastic in underwear and latex bandaides.   Nitrofurantoin Diarrhea   Prednisone Other (See Comments)   Headache   Pylera [bis Subcit-metronid-tetracyc] Swelling   Tongue swelling. Face tingling   Sulfa Antibiotics Other (See Comments)   Increased pulse, fainting, diarrhea, thrush   Xylocaine [lidocaine Hcl]    With epinephrine, given by dentist.  Speeded up heart rate and she passed out (occured twice, at dentist)   Sharlee Blew Hcl] Swelling, Other (See Comments)   Migraine Swelling of tongue/lip (09/2012)   Advil [ibuprofen] Other (See Comments)   Motrin ok with a GI effect.   Clarithromycin Rash   Started after completing 10 day course of 2000 mg /day      Medication List    TAKE these medications   ALIGN PO Take 1 capsule by mouth daily.   ALPRAZolam 0.5 MG tablet Commonly known as:  XANAX TAKE BY MOUTH UP TO 3 TIMES DAILY. WARNING: BENZODIAZEPINES INCREASE THE RISK OF FALLS AND NEUROCOGNITIVE DECLINE IN THE ELDERLY What changed:    how much to take  how to take this  when to take this  reasons to take this  additional instructions   AZO CONFIDENCE PO Take 1 tablet by mouth once.   b complex vitamins capsule Take 1 capsule by mouth daily.   Carbidopa-Levodopa ER 25-100 MG tablet controlled release Commonly known as:  SINEMET CR Take 1 tablet by mouth 4 (four) times daily. What changed:  when to take this   dicyclomine 10 MG capsule Commonly known as:  BENTYL Take 1 capsule (10 mg total) by mouth every 6 (six) hours as needed (abdominal discomfort).   ondansetron 4 MG disintegrating tablet Commonly known as:  ZOFRAN-ODT Take 1 tablet (4 mg total) by mouth every 8 (eight) hours as needed for  nausea or vomiting.   selegiline 5 MG capsule Commonly known as:  ELDEPRYL Take 5 mg by mouth daily.   Synthroid 25 MCG tablet Generic drug:  levothyroxine TAKE 1 TABLET DAILY BEFORE BREAKFAST What changed:  how much to take   Vitamin D 50 MCG (2000 UT) tablet Take 2,000 Units by mouth daily.   vitamin E 400 UNIT capsule Take 400 Units by mouth every Monday, Wednesday, and Friday. Reported on 09/06/2015      Follow-up Information    [name], MD Follow up in 1 week(s).   Specialty:  Family Medicine Contact information: [address] [place] [address] [phone]          Allergies  Allergen Reactions  . Iodine Anaphylaxis    IV and topical forms.  Burnard Leigh [Hyoscyamine Sulfate]     Vision problems/pt has glaucoma  . Salmon [Fish Allergy] Hives and Shortness Of Breath  . Shellfish Allergy Anaphylaxis  . Remeron [Mirtazapine] Other (See Comments)    Cause blurred vision and red eyes, pt has glaucoma  .  Aspirin Other (See Comments)    Sever stomach pain due to ulcer scaring.  . Ciprofloxacin Diarrhea  . Codeine Nausea And Vomiting  . Darvocet [Propoxyphene N-Acetaminophen] Nausea And Vomiting  . Demerol [Meperidine] Nausea Only  . Dexilant [Dexlansoprazole] Swelling    Redness, swelling and peeling of both feet.  . Diphedryl [Diphenhydramine] Other (See Comments)    Increased pulse/small amount ok  . Doxycycline Hyclate Other (See Comments)    GI intolerance.  Marland Kitchen Epinephrine Other (See Comments)    Breathing problems  . Erythromycin Other (See Comments)    GI intolerance.  Yvette Rack [Cyclobenzaprine] Other (See Comments)    Tingly/prickly sensation.  Marland Kitchen Keflex [Cephalexin] Hives  . Latex Other (See Comments)    Gloves ok.  Skin gets red from elastic in underwear and latex bandaides.  . Nitrofurantoin Diarrhea  . Prednisone Other (See Comments)    Headache  . Pylera [Bis Subcit-Metronid-Tetracyc] Swelling    Tongue swelling. Face tingling   . Sulfa Antibiotics Other (See Comments)    Increased pulse, fainting, diarrhea, thrush  . Xylocaine [Lidocaine Hcl]     With epinephrine, given by dentist.  Speeded up heart rate and she passed out (occured twice, at dentist)  . Zoloft [Sertraline Hcl] Swelling and Other (See Comments)    Migraine Swelling of tongue/lip (09/2012)  . Advil [Ibuprofen] Other (See Comments)    Motrin ok with a GI effect.  . Clarithromycin Rash    Started after completing 10 day course of 2000 mg /day    Consultations: Gastroenterology and palliative care   Procedures/Studies: Ct Abdomen Pelvis Wo Contrast  Result Date: 11/22/2018 CLINICAL DATA:  Initial evaluation for acute abdominal pain. Diverticulitis suspected. EXAM: CT ABDOMEN AND PELVIS WITHOUT CONTRAST TECHNIQUE: Multidetector CT imaging of the abdomen and pelvis was performed following the standard protocol without IV contrast. COMPARISON:  Prior CT from 09/18/2014. FINDINGS: Lower chest: Scattered subsegmental atelectatic changes and/or fibrosis noted within the visualized lung bases. 7 mm pleural base nodular density at the posterior right lung base (series 4, image 34), indeterminate. Visualized lungs are otherwise clear. Hepatobiliary: Limited noncontrast evaluation of the liver is unremarkable. Gallbladder normal. No biliary dilatation. Pancreas: Pancreas within normal limits. Spleen: Spleen within normal limits. Adrenals/Urinary Tract: Adrenal glands are normal. Kidneys equal in size without evidence for nephrolithiasis or hydronephrosis. No radiopaque calculi seen along the course of either renal collecting system. No hydroureter. Partially distended bladder within normal limits. Stomach/Bowel: Stomach mildly distended with enteric contrast material within the gastric lumen. No evidence for bowel obstruction. Normal appendix. Colon largely decompressed. Mild circumferential wall thickening about the transverse colon, which could be related incomplete  distension or possibly mild colitis. No other acute inflammatory changes about the bowels. Vascular/Lymphatic: Mild aorto bi-iliac atherosclerotic disease. No aneurysm. No adenopathy. Reproductive: Uterus is absent. Atrophic right ovary. 3.7 cm simple cyst present within the left ovary. Other: No free air or fluid. Musculoskeletal: No acute osseous finding. No discrete lytic or blastic osseous lesions. IMPRESSION: 1. Mild circumferential wall thickening about the transverse colon, which may be related incomplete distension or possibly mild colitis. 2. No other acute intra-abdominal or pelvic process. 3. 3.7 cm simple left ovarian cyst. Follow-up examination with dedicated pelvic ultrasound recommended for complete evaluation. 4. 7 mm pleural-based nodular density at the posterior right lung base, indeterminate. Non-contrast chest CT at 6-12 months is recommended. If the nodule is stable at time of repeat CT, then future CT at 18-24 months (from today's scan) is considered  optional for low-risk patients, but is recommended for high-risk patients. This recommendation follows the consensus statement: Guidelines for Management of Incidental Pulmonary Nodules Detected on CT Images: From the Fleischner Society 2017; Radiology 2017; 284:228-243. Electronically Signed   By: Jeannine Boga M.D.   On: 11/22/2018 02:17   US Pelvis (transabdominal Only)  Result Date: 11/22/2018 CLINICAL DATA:  Left ovarian cysts seen on abdominal CT. Left lower quadrant pain for 3-4 weeks EXAM: TRANSABDOMINAL ULTRASOUND OF PELVIS TECHNIQUE: Transabdominal ultrasound examination of the pelvis was performed including evaluation of the uterus, ovaries, adnexal regions, and pelvic cul-de-sac. COMPARISON:  Abdominal CT from earlier today FINDINGS: Uterus Surgically absent Right ovary Not visualized due to bowel.  No abnormality by CT. Left ovary Measurements: 4.6 x 4.5 x 3 cm = volume: 32 mL. There is an underlying 3.4 cm avascular anechoic  cyst. Other findings:  No abnormal free fluid. IMPRESSION: 1. Simple 3.4 cm left ovarian cyst. One year follow-up pelvic ultrasound is recommended if appropriate for comorbidities. 2. The right ovary was not visualized sonographically but the right adnexa was normal by preceding CT. Electronically Signed   By: Monte Fantasia M.D.   On: 11/22/2018 08:11      Discharge Exam: Vitals:   11/25/18 0541 11/25/18 1342  BP: (!) 107/43 (!) 115/40  Pulse: 74 74  Resp: 18 12  Temp: 97.8 F (36.6 C) 97.9 F (36.6 C)  SpO2: 94% 100%   Vitals:   11/24/18 1246 11/24/18 2006 11/25/18 0541 11/25/18 1342  BP: 120/67 (!) 106/44 (!) 107/43 (!) 115/40  Pulse: 77 81 74 74  Resp: 16 18 18 12   Temp: 98.6 F (37 C) 98.6 F (37 C) 97.8 F (36.6 C) 97.9 F (36.6 C)  TempSrc: Oral Oral Oral Oral  SpO2: 96% 95% 94% 100%  Weight:      Height:        General: Pt is alert, awake, not in acute distress Cardiovascular: RRR, S1/S2 +, no rubs, no gallops Respiratory: CTA bilaterally, no wheezing, no rhonchi Abdominal: Soft, NT, ND, bowel sounds + Extremities: no edema, no cyanosis    The results of significant diagnostics from this hospitalization (including imaging, microbiology, ancillary and laboratory) are listed below for reference.     Microbiology: Recent Results (from the past 240 hour(s))  Urine culture     Status: Abnormal   Collection Time: 11/21/18  9:57 PM  Result Value Ref Range Status   Specimen Description   Final    URINE, RANDOM Performed at [facility] [address], [place], [place] [address]    Special Requests   Final    NONE Performed at [facility], [place] [address], [place], [place] [address]    Culture >=100,000 COLONIES/mL KLEBSIELLA PNEUMONIAE (A)  Final   Report Status 11/24/2018 FINAL  Final   Organism ID, Bacteria KLEBSIELLA PNEUMONIAE (A)  Final      Susceptibility   Klebsiella pneumoniae - MIC*    AMPICILLIN >=32  RESISTANT Resistant     CEFAZOLIN <=4 SENSITIVE Sensitive     CEFTRIAXONE <=1 SENSITIVE Sensitive     CIPROFLOXACIN <=0.25 SENSITIVE Sensitive     GENTAMICIN <=1 SENSITIVE Sensitive     IMIPENEM <=0.25 SENSITIVE Sensitive     NITROFURANTOIN 64 INTERMEDIATE Intermediate     TRIMETH/SULFA <=20 SENSITIVE Sensitive     AMPICILLIN/SULBACTAM 4 SENSITIVE Sensitive     PIP/TAZO <=4 SENSITIVE Sensitive     Extended ESBL NEGATIVE Sensitive     * >=  100,000 COLONIES/mL KLEBSIELLA PNEUMONIAE  Culture, blood (routine x 2)     Status: None (Preliminary result)   Collection Time: 11/22/18 11:24 AM  Result Value Ref Range Status   Specimen Description   Final    BLOOD RIGHT ARM Performed at [facility], [address] [place], [place] [address]    Special Requests   Final    BOTTLES DRAWN AEROBIC AND ANAEROBIC Blood Culture results may not be optimal due to an excessive volume of blood received in culture bottles Performed at [facility] [address] [place], [place] [address]    Culture   Final    NO GROWTH 3 DAYS Performed at [facility], [place] [address] [place], [place] [address]    Report Status PENDING  Incomplete  Culture, blood (routine x 2)     Status: None (Preliminary result)   Collection Time: 11/22/18 11:24 AM  Result Value Ref Range Status   Specimen Description   Final    BLOOD RIGHT HAND Performed at [facility], [place] [address], [place], [place] [address]    Special Requests   Final    BOTTLES DRAWN AEROBIC ONLY Blood Culture adequate volume Performed at [facility] [address] [place], [place] [address]    Culture   Final    NO GROWTH 3 DAYS Performed at [facility], [place] [address] [place], [place] [address]    Report Status PENDING  Incomplete  SARS Coronavirus 2 [facility] order, Performed in [place] hospital lab)     Status: None   Collection Time: 11/24/18  9:15 AM  Result Value Ref  Range Status   SARS Coronavirus 2 NEGATIVE NEGATIVE Final    Comment: (NOTE) If result is NEGATIVE SARS-CoV-2 target nucleic acids are NOT DETECTED. The SARS-CoV-2 RNA is generally detectable in upper and lower  respiratory specimens during the acute phase of infection. The lowest  concentration of SARS-CoV-2 viral copies this assay can detect is 250  copies / mL. A negative result does not preclude SARS-CoV-2 infection  and should not be used as the sole basis for treatment or other  patient management decisions.  A negative result may occur with  improper specimen collection / handling, submission of specimen other  than nasopharyngeal swab, presence of viral mutation(s) within the  areas targeted by this assay, and inadequate number of viral copies  (<250 copies / mL). A negative result must be combined with clinical  observations, patient history, and epidemiological information. If result is POSITIVE SARS-CoV-2 target nucleic acids are DETECTED. The SARS-CoV-2 RNA is generally detectable in upper and lower  respiratory specimens dur ing the acute phase of infection.  Positive  results are indicative of active infection with SARS-CoV-2.  Clinical  correlation with patient history and other diagnostic information is  necessary to determine patient infection status.  Positive results do  not rule out bacterial infection or co-infection with other viruses. If result is PRESUMPTIVE POSTIVE SARS-CoV-2 nucleic acids MAY BE PRESENT.   A presumptive positive result was obtained on the submitted specimen  and confirmed on repeat testing.  While 2019 novel coronavirus  (SARS-CoV-2) nucleic acids may be present in the submitted sample  additional confirmatory testing may be necessary for epidemiological  and / or clinical management purposes  to differentiate between  SARS-CoV-2 and other Sarbecovirus currently known to infect humans.  If clinically indicated additional testing with an  alternate test  methodology [phone]) is advised. The SARS-CoV-2 RNA is generally  detectable in upper and lower respiratory sp ecimens during the acute  phase of infection. The expected result is Negative. Fact Sheet for Patients:  StrictlyIdeas.no Fact Sheet for Healthcare Providers: BankingDealers.co.za This test is not yet approved or cleared by the [place] FDA and has been authorized for detection and/or diagnosis of SARS-CoV-2 by FDA under an Emergency Use Authorization (EUA).  This EUA will remain in effect (meaning this test can be used) for the duration of the COVID-19 declaration under Section 564(b)(1) of the Act, 21 U.S.C. section 360bbb-3(b)(1), unless the authorization is terminated or revoked sooner. Performed at [facility], [place] [address], [place], [place] [address]      Labs: BNP (last 3 results) No results for input(s): BNP in the last 8760 hours. Basic Metabolic Panel: Recent Labs  Lab 11/21/18 2300 11/23/18 0455 11/24/18 0600 11/25/18 0505  NA 139 140 141 140  K 3.7 3.6 3.3* 4.0  CL 105 109 105 107  CO2 25 24 28 27   GLUCOSE 146* 108* 93 99  BUN 15 5* 6* 8  CREATININE 0.70 0.62 0.66 0.61  CALCIUM 9.2 8.3* 8.6* 8.5*   Liver Function Tests: Recent Labs  Lab 11/21/18 2300 11/23/18 0455 11/24/18 0600 11/25/18 0505  AST 22 20 17 22   ALT 5 11 12 12   ALKPHOS 77 63 68 63  BILITOT 0.9 1.7* 1.3* 0.9  PROT 7.4 6.1* 5.9* 6.1*  ALBUMIN 4.0 3.1* 3.0* 2.9*   Recent Labs  Lab 11/21/18 2300  LIPASE 44   No results for input(s): AMMONIA in the last 168 hours. CBC: Recent Labs  Lab 11/21/18 2300  11/22/18 0722 11/22/18 1126 11/23/18 0455 11/24/18 0600 11/25/18 0505  WBC 20.8*   < > 15.2* 16.2* 19.3* 20.9* 17.5*  NEUTROABS 18.8*  --   --   --  14.7* 16.3* 12.3*  HGB 14.7   < > 13.9 14.3 13.3 12.9 12.9  HCT 47.4*   < > 45.4 45.8 42.7 41.7 41.1  MCV 97.7   < > 98.3 98.5 96.8  97.0 97.2  PLT 288   < > 291 252 284 251 251   < > = values in this interval not displayed.   Cardiac Enzymes: Recent Labs  Lab 11/24/18 1102  CKTOTAL 29*   BNP: Invalid input(s): POCBNP CBG: No results for input(s): GLUCAP in the last 168 hours. D-Dimer No results for input(s): DDIMER in the last 72 hours. Hgb A1c No results for input(s): HGBA1C in the last 72 hours. Lipid Profile No results for input(s): CHOL, HDL, LDLCALC, TRIG, CHOLHDL, LDLDIRECT in the last 72 hours. Thyroid function studies No results for input(s): TSH, T4TOTAL, T3FREE, THYROIDAB in the last 72 hours.  Invalid input(s): FREET3 Anemia work up No results for input(s): VITAMINB12, FOLATE, FERRITIN, TIBC, IRON, RETICCTPCT in the last 72 hours. Urinalysis    Component Value Date/Time   COLORURINE YELLOW 11/21/2018 2157   APPEARANCEUR CLOUDY (A) 11/21/2018 2157   LABSPEC 1.017 11/21/2018 2157   LABSPEC 1.015 10/01/2018 0857   PHURINE 5.0 11/21/2018 2157   GLUCOSEU NEGATIVE 11/21/2018 2157   HGBUR NEGATIVE 11/21/2018 2157   BILIRUBINUR NEGATIVE 11/21/2018 2157   BILIRUBINUR negative 10/01/2018 0857   BILIRUBINUR neg 09/06/2015 1334   KETONESUR 5 (A) 11/21/2018 2157   PROTEINUR NEGATIVE 11/21/2018 2157   UROBILINOGEN negative 09/06/2015 1334   UROBILINOGEN 0.2 07/04/2013 1050   NITRITE POSITIVE (A) 11/21/2018 2157   LEUKOCYTESUR LARGE (  A) 11/21/2018 2157   Sepsis Labs Invalid input(s): PROCALCITONIN,  WBC,  LACTICIDVEN Microbiology Recent Results (from the past 240 hour(s))  Urine culture     Status: Abnormal   Collection Time: 11/21/18  9:57 PM  Result Value Ref Range Status   Specimen Description   Final    URINE, RANDOM Performed at [facility] [address], [place], [place] [address]    Special Requests   Final    NONE Performed at [facility], [place] [address] [place], [place] [address]    Culture >=100,000 COLONIES/mL KLEBSIELLA PNEUMONIAE (A)   Final   Report Status 11/24/2018 FINAL  Final   Organism ID, Bacteria KLEBSIELLA PNEUMONIAE (A)  Final      Susceptibility   Klebsiella pneumoniae - MIC*    AMPICILLIN >=32 RESISTANT Resistant     CEFAZOLIN <=4 SENSITIVE Sensitive     CEFTRIAXONE <=1 SENSITIVE Sensitive     CIPROFLOXACIN <=0.25 SENSITIVE Sensitive     GENTAMICIN <=1 SENSITIVE Sensitive     IMIPENEM <=0.25 SENSITIVE Sensitive     NITROFURANTOIN 64 INTERMEDIATE Intermediate     TRIMETH/SULFA <=20 SENSITIVE Sensitive     AMPICILLIN/SULBACTAM 4 SENSITIVE Sensitive     PIP/TAZO <=4 SENSITIVE Sensitive     Extended ESBL NEGATIVE Sensitive     * >=100,000 COLONIES/mL KLEBSIELLA PNEUMONIAE  Culture, blood (routine x 2)     Status: None (Preliminary result)   Collection Time: 11/22/18 11:24 AM  Result Value Ref Range Status   Specimen Description   Final    BLOOD RIGHT ARM Performed at [facility], [address] [place], [place] [address]    Special Requests   Final    BOTTLES DRAWN AEROBIC AND ANAEROBIC Blood Culture results may not be optimal due to an excessive volume of blood received in culture bottles Performed at [facility] [address], [place], [place] [address]    Culture   Final    NO GROWTH 3 DAYS Performed at [facility], [place] [address], [place], [place] [address]    Report Status PENDING  Incomplete  Culture, blood (routine x 2)     Status: None (Preliminary result)   Collection Time: 11/22/18 11:24 AM  Result Value Ref Range Status   Specimen Description   Final    BLOOD RIGHT HAND Performed at [facility], [place] [address] [place], [place] [address]    Special Requests   Final    BOTTLES DRAWN AEROBIC ONLY Blood Culture adequate volume Performed at [facility] [address], [place], [place] [address]    Culture   Final    NO GROWTH 3 DAYS Performed at [facility], [place] [address], [place], [place] [address]    Report  Status PENDING  Incomplete  SARS Coronavirus 2 [facility] order, Performed in [place] hospital lab)     Status: None   Collection Time: 11/24/18  9:15 AM  Result Value Ref Range Status   SARS Coronavirus 2 NEGATIVE NEGATIVE Final    Comment: (NOTE) If result is NEGATIVE SARS-CoV-2 target nucleic acids are NOT DETECTED. The SARS-CoV-2 RNA is generally detectable in upper and lower  respiratory specimens during the acute phase of infection. The lowest  concentration of SARS-CoV-2 viral copies this assay can detect is 250  copies / mL. A negative result does not preclude SARS-CoV-2 infection  and should not be used as the sole basis  for treatment or other  patient management decisions.  A negative result may occur with  improper specimen collection / handling, submission of specimen other  than nasopharyngeal swab, presence of viral mutation(s) within the  areas targeted by this assay, and inadequate number of viral copies  (<250 copies / mL). A negative result must be combined with clinical  observations, patient history, and epidemiological information. If result is POSITIVE SARS-CoV-2 target nucleic acids are DETECTED. The SARS-CoV-2 RNA is generally detectable in upper and lower  respiratory specimens dur ing the acute phase of infection.  Positive  results are indicative of active infection with SARS-CoV-2.  Clinical  correlation with patient history and other diagnostic information is  necessary to determine patient infection status.  Positive results do  not rule out bacterial infection or co-infection with other viruses. If result is PRESUMPTIVE POSTIVE SARS-CoV-2 nucleic acids MAY BE PRESENT.   A presumptive positive result was obtained on the submitted specimen  and confirmed on repeat testing.  While 2019 novel coronavirus  (SARS-CoV-2) nucleic acids may be present in the submitted sample  additional confirmatory testing may be necessary for epidemiological  and / or  clinical management purposes  to differentiate between  SARS-CoV-2 and other Sarbecovirus currently known to infect humans.  If clinically indicated additional testing with an alternate test  methodology [phone]) is advised. The SARS-CoV-2 RNA is generally  detectable in upper and lower respiratory sp ecimens during the acute  phase of infection. The expected result is Negative. Fact Sheet for Patients:  StrictlyIdeas.no Fact Sheet for Healthcare Providers: BankingDealers.co.za This test is not yet approved or cleared by the [place] FDA and has been authorized for detection and/or diagnosis of SARS-CoV-2 by FDA under an Emergency Use Authorization (EUA).  This EUA will remain in effect (meaning this test can be used) for the duration of the COVID-19 declaration under Section 564(b)(1) of the Act, 21 U.S.C. section 360bbb-3(b)(1), unless the authorization is terminated or revoked sooner. Performed at [facility], [place] [address] [place], [place] [address]      Time coordinating discharge: 45 minutes  SIGNED:   Darliss Cheney, MD  [facility] 11/25/2018, 2:42 PM Pager [phone]  If 7PM-7AM, please contact night-coverage [email or web address] Password [email or web address]

## 2018-11-25 NOTE — Progress Notes (Deleted)
Review of previous GI testing; Dr. Benson Norway and changed to Dr. Ardis Hughs 10/2014:  -CBC, CMP, and TSH were normal in 07/2014 and again in 08/2014. CT scan02/26/2016.This was done without contrast due to dye allergy.This showed no acute findings in the abdomen and pelvis specifically to explain her history of abdominal pain. There is a 3.0 cm left ovarian cystic lesion that is likely benign. As I stated above, she is following with gynecology and they're going to monitor that, but they think that it is benign.Stomach, duodenum, small bowel, and colon grossly unremarkable.Abdominal ultrasound on 02/12/2016showed no acute or chronic abnormalities of the hepatobiliary tree; evaluation of the pancreatic tail was limited. There is no acute abnormalities of the kidney demonstrated. A 1.6 cm diameter mid to lower pole cyst was noted in the right kidney. August 2015 HIDA scan with [facility], which was normal.This was performed for complaints of upper abdominal pain, appetite loss, fatigue, and weight loss at that time as well.EGD was in December 2013at which time she was found to have moderate gastritis. Biopsies actually showed H. Pylori with chronic active gastritis, however, after taking 1 dose of the Pylera medication she discontinued it because of side effects (it made her feel horrible).Therefore, she has not completed treatment for H. Pylori. Duodenal biopsies were unremarkable at that time.flexible sigmoidoscopy also in December 2013that was normal with only medium hemorrhoids noted. This was performed for diarrhea and biopsies of the descending and sigmoid colon were unremarkable. She states that she had a full colonoscopy prior to that with Dr. Benson Norway, however, after calling his office they stated they have never performed a full colonoscopy on her. Gastric emptying scan in January 2014showed 33% of the ingested activity remaining in the stomach which is mildly abnormal compared to the normal range of less than 30%  of the ingested activity remaining at 120 minutes.Repeat GES 3/2016was normal. EGD 11/2014 with Dr. Ardis Hughs showed pan-gastritis withpositive Hpylori on biopsies; also small hiatal hernia. Saw ID and was apparently treated with subsequent testing negative. Repeat abdominal ultrasound 09/2015 was normal.07/2016 H. Pylori stool antigen was negative. Repeat EGD 08/2016: mild gastritis, + for H pylori again. Referred to ID and tried a regimen but could not complete it due to    This service was provided via virtual visit.  Both audio and visual were used.  *** Only audio was used.  The patient was located at home.  I was located in my office.  The patient did consent to this virtual visit and is aware of possible charges through their insurance for this visit.  The patient is a *** patient.  My certified medical assistant, Tracy Malone, contributed to this visit by contacting the patient by phone 1 or 2 business days prior to the appointment and also followed up on the recommendations I made after the visit.  Time spent on virtual visit: ***   HPI: This is a ***  I last saw her in the office a little over 2 years ago..  At that point I felt a lot of her symptoms might be functional after extensive GI work-up and the fact that she had been complaining of GI issues for 68 years, she has 23 or 24 medicine allergies listed.   CT scan Nov 22, 2018 abdomen pelvis without IV contrast "evaluation of acute abdominal pain" IMPRESSION: 1. Mild circumferential wall thickening about the transverse colon, which may be related incomplete distension or possibly mild colitis. 2. No other acute intra-abdominal or pelvic process. 3. 3.7  cm simple left ovarian cyst. Follow-up examination with dedicated pelvic ultrasound recommended for complete evaluation.  Chief complaint is ***  ROS: complete GI ROS as described in HPI, all other review negative.  Constitutional:  No unintentional weight loss   Past  Medical History:  Diagnosis Date  . Bell's palsy 1966   ? right side facial droop  . Carotid artery disease ([place]) 2010   on vascular screening;unchanged 2013.(could not tolerate simvastatin, no other statins tried)--<30% blockage bilat 07/2011  . Chronic abdominal pain   . Chronic fatigue and malaise   . Claustrophobia   . Depression    treated in the past for years;stopped in 2010 for a years  . Duodenal ulcer 1962   h/o  . Dysrhythmia    ocassional PVC's  . Fibromyalgia   . 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 GERD (gastroesophageal reflux disease)   . Glaucoma, narrow-angle    s/p laser surgery  . History of hiatal hernia    during endoscopy  . Hypothyroid 9/08  . IBS (irritable bowel syndrome)    Dr. Benson Norway  . Ocular migraine   . Osteoporosis 10/11   Dr.Hawkes  . Panic attack   . Recurrent UTI    has cystocele-Dr.Grewal  . Shingles 1999   h/o  . Superficial thrombophlebitis 03/2009   RLE  . Trochanteric bursitis 12/2008   bilateral    Past Surgical History:  Procedure Laterality Date  . ABDOMINAL HYSTERECTOMY    . CATARACT EXTRACTION, BILATERAL  1995, 1996  . Flexible sigmoidoscopy    . THYROIDECTOMY, PARTIAL  09/2005   L nodule; Dr. Harlow Asa  . TONSILLECTOMY  1946  . UPPER GI ENDOSCOPY  06/27/12  . VAGINAL HYSTERECTOMY  1971   and bladder repair.  Still has ovaries    No current facility-administered medications for this visit.    No current outpatient medications on file.   Facility-Administered Medications Ordered in Other Visits  Medication Dose Route Frequency Provider Last Rate Last Dose  . acetaminophen (TYLENOL) tablet 650 mg  650 mg Oral Q6H PRN Rise Patience, MD       Or  . acetaminophen (TYLENOL) suppository 650 mg  650 mg Rectal Q6H PRN Rise Patience, MD      . ALPRAZolam Duanne Moron) tablet 0.5 mg  0.5 mg Oral QID PRN Rise Patience, MD   0.5 mg at 11/24/18 2135  . bismuth subsalicylate  (PEPTO BISMOL) 262 MG/15ML suspension 30 mL  30 mL Oral TID AC & HS Darliss Cheney, MD   30 mL at 11/25/18 0819  . Carbidopa-Levodopa ER (SINEMET CR) 25-100 MG tablet controlled release 1 tablet  1 tablet Oral TID Rise Patience, MD   1 tablet at 11/25/18 0818  . levothyroxine (SYNTHROID) tablet 25 mcg  25 mcg Oral QAC breakfast Rise Patience, MD   25 mcg at 11/25/18 0533  . ondansetron (ZOFRAN) tablet 4 mg  4 mg Oral Q6H PRN Rise Patience, MD       Or  . ondansetron [facility]) injection 4 mg  4 mg Intravenous Q6H PRN Rise Patience, MD      . oxyCODONE-acetaminophen (PERCOCET/ROXICET) 5-325 MG per tablet 1 tablet  1 tablet Oral Q6H PRN Darliss Cheney, MD   1 tablet at 11/25/18 0829  . selegiline (ELDEPRYL) tablet 5 mg  5 mg Oral Daily Rise Patience, MD   5 mg at 11/25/18 [phone]  Allergies as of 11/25/2018 - Review Complete 11/22/2018  Allergen Reaction Noted  . Iodine Anaphylaxis 05/13/2012  . Levsin [hyoscyamine sulfate]  10/13/2015  . Salmon [fish allergy] Hives and Shortness Of Breath 05/13/2012  . Shellfish allergy Anaphylaxis 05/13/2012  . Remeron [mirtazapine] Other (See Comments) 08/25/2016  . Aspirin Other (See Comments) 05/13/2012  . Ciprofloxacin Diarrhea 05/13/2012  . Codeine Nausea And Vomiting 05/13/2012  . Darvocet [propoxyphene n-acetaminophen] Nausea And Vomiting 05/13/2012  . Demerol [meperidine] Nausea Only 05/13/2012  . Dexilant [dexlansoprazole] Swelling 03/01/2016  . Diphedryl [diphenhydramine] Other (See Comments) 05/13/2012  . Doxycycline hyclate Other (See Comments) 05/13/2012  . Epinephrine Other (See Comments) 05/13/2012  . Erythromycin Other (See Comments) 05/13/2012  . Flexeril [cyclobenzaprine] Other (See Comments) 05/13/2012  . Keflex [cephalexin] Hives 05/13/2012  . Latex Other (See Comments) 05/13/2012  . Nitrofurantoin Diarrhea 11/22/2018  . Prednisone Other (See Comments) 05/13/2012  . Pylera [bis  subcit-metronid-tetracyc] Swelling 08/01/2012  . Sulfa antibiotics Other (See Comments) 05/13/2012  . Xylocaine [lidocaine hcl]  05/13/2012  . Zoloft [sertraline hcl] Swelling and Other (See Comments) 05/13/2012  . Advil [ibuprofen] Other (See Comments) 05/13/2012  . Clarithromycin Rash 01/14/2015    Family History  Problem Relation Age of Onset  . Heart disease Mother   . Hypertension Mother   . Hypertension Sister   . HIV Son   . Heart disease Brother   . Lung cancer Brother        lung  . Diabetes Maternal Grandfather   . Diabetes Granddaughter        type 1    Social History   Socioeconomic History  . Marital status: Married    Spouse name: Not on file  . Number of children: 2  . Years of education: Not on file  . Highest education level: Not on file  Occupational History  . Occupation: retired (school system)  Social Needs  . Financial resource strain: Not on file  . Food insecurity:    Worry: Not on file    Inability: Not on file  . Transportation needs:    Medical: Not on file    Non-medical: Not on file  Tobacco Use  . Smoking status: Never Smoker  . Smokeless tobacco: Never Used  Substance and Sexual Activity  . Alcohol use: No    Alcohol/week: 0.0 standard drinks  . Drug use: No  . Sexual activity: Not Currently    Partners: Male  Lifestyle  . Physical activity:    Days per week: Not on file    Minutes per session: Not on file  . Stress: Not on file  Relationships  . Social connections:    Talks on phone: Not on file    Gets together: Not on file    Attends religious service: Not on file    Active member of club or organization: Not on file    Attends meetings of clubs or organizations: Not on file    Relationship status: Not on file  . Intimate partner violence:    Fear of current or ex partner: Not on file    Emotionally abused: Not on file    Physically abused: Not on file    Forced sexual activity: Not on file  Other Topics Concern  .  Not on file  Social History Narrative   Married.  Son lives in [place]; Daughter [name] lives in [place]; 2 grandchildren     Physical Exam: Unable to perform because this was a "telemed visit" due to  current Covid-19 pandemic  Assessment and plan: 85 y.o. female with ***  ***  Please see the "Patient Instructions" section for addition details about the plan.  Tracy Loffler, MD [facility] 11/25/2018, 9:23 AM

## 2018-11-25 NOTE — Progress Notes (Signed)
Physical Therapy Treatment Patient Details Name: Tracy Malone MRN: [id number] DOB: May 22, 1932 Today's Date: 11/25/2018    History of Present Illness 85 yo female admitted to ED on 4/30 with abdominal pain, CT revealing colitis. Pt also with UTI. PMH includes CAD, depression, fibromyalgia, PVCs, IBS, migraines, OP, Parkinson's disease.    PT Comments    Pt tolerating ambulation distance well, has some difficulty with challenges to dynamic standing balance but no overt LOB noted during session. PT introduced CL LE and UE exercise and sit to stands for general strengthening and to address Parkinson's-related deficits. Pt tolerated exercises well, and states she does sit to stands and fine motor exercises at home. PT to continue to follow acutely, no PT follow up needed at this time.    Follow Up Recommendations  No PT follow up;Supervision for mobility/OOB     Equipment Recommendations  None recommended by PT    Recommendations for Other Services       Precautions / Restrictions Precautions Precautions: Fall Restrictions Weight Bearing Restrictions: No    Mobility  Bed Mobility Overal bed mobility: Needs Assistance Bed Mobility: Supine to Sit;Sit to Supine     Supine to sit: Supervision Sit to supine: Supervision   General bed mobility comments: Supervision for safety, increased time to come to sitting and reports of increased abdominal pain with mobility.   Transfers Overall transfer level: Needs assistance Equipment used: None;Rolling walker (2 wheeled) Transfers: Sit to/from Stand Sit to Stand: Min guard(from bed, toilet)         General transfer comment: Min guard for safety, increased time to come to standing.   Ambulation/Gait Ambulation/Gait assistance: Supervision;Min guard Gait Distance (Feet): 400 Feet Assistive device: None Gait Pattern/deviations: Step-through pattern;Decreased stride length;Drifts right/left;Shuffle Gait velocity: slightly decr    General Gait Details: Min guard to supervision for standby steady assist. Pt with deviations to gait when dynamic standing balance challenged (see balance section).    Stairs             Wheelchair Mobility    Modified Rankin (Stroke Patients Only)       Balance Overall balance assessment: Needs assistance Sitting-balance support: No upper extremity supported;Feet supported Sitting balance-Leahy Scale: Good     Standing balance support: During functional activity;No upper extremity supported Standing balance-Leahy Scale: Fair Standing balance comment: able to stand without UE assist, difficulty with challenge to balance             High level balance activites: Turns;Head turns;Other (comment) High Level Balance Comments: Pt with WNL gait with head turns horizontally and vertically. Pt with unsteadiness no LOB noted with turning 180*, weaving in hallway, stepping over obstacle.             Cognition Arousal/Alertness: Awake/alert Behavior During Therapy: WFL for tasks assessed/performed Overall Cognitive Status: Within Functional Limits for tasks assessed                                        Exercises Other Exercises Other Exercises: contralateral UE flexion and LE marching in seated, x10 bilaterally  Other Exercises: Sit to stand without UE use x6, limited by fatigue     General Comments        Pertinent Vitals/Pain Pain Assessment: 0-10 Pain Score: 5  Pain Location: abdomen, with mobility  Pain Descriptors / Indicators: Discomfort;Cramping Pain Intervention(s): Monitored during session;Repositioned;Limited activity within patient's tolerance;Premedicated  before session    Home Living                      Prior Function            PT Goals (current goals can now be found in the care plan section) Acute Rehab PT Goals Patient Stated Goal: go home to husband  PT Goal Formulation: With patient Time For Goal  Achievement: 12/06/18 Potential to Achieve Goals: Good Progress towards PT goals: Progressing toward goals    Frequency    Min 3X/week      PT Plan Current plan remains appropriate    Co-evaluation              AM-PAC PT "6 Clicks" Mobility   Outcome Measure  Help needed turning from your back to your side while in a flat bed without using bedrails?: A Little Help needed moving from lying on your back to sitting on the side of a flat bed without using bedrails?: A Little Help needed moving to and from a bed to a chair (including a wheelchair)?: A Little Help needed standing up from a chair using your arms (e.g., wheelchair or bedside chair)?: A Little Help needed to walk in hospital room?: A Little Help needed climbing 3-5 steps with a railing? : A Little 6 Click Score: 18    End of Session Equipment Utilized During Treatment: Gait belt Activity Tolerance: Patient tolerated treatment well Patient left: in bed;with call bell/phone within reach Nurse Communication: Mobility status PT Visit Diagnosis: Other abnormalities of gait and mobility (R26.89);Pain Pain - Right/Left: (lower) Pain - part of body: (abdomen)     Time: 0156-1537 PT Time Calculation (min) (ACUTE ONLY): 16 min  Charges:  $Gait Training: 8-22 mins                    Julien Girt, PT Acute Rehabilitation Services Pager [phone]  Office [place] 11/25/2018, 11:57 AM

## 2018-11-25 NOTE — Progress Notes (Signed)
Subjective: Minimal abdominal pain now   Antibiotics:  Anti-infectives (From admission, onward)   Start     Dose/Rate Route Frequency Ordered Stop   11/22/18 1200  piperacillin-tazobactam (ZOSYN) IVPB 3.375 g  Status:  Discontinued     3.375 g 12.5 mL/hr over 240 Minutes Intravenous Every 8 hours 11/22/18 0450 11/24/18 1640   11/22/18 0245  piperacillin-tazobactam (ZOSYN) IVPB 3.375 g     3.375 g 12.5 mL/hr over 240 Minutes Intravenous  Once 11/22/18 0242 11/22/18 0501      Medications: Scheduled Meds: . bismuth subsalicylate  30 mL Oral TID AC & HS  . Carbidopa-Levodopa ER  1 tablet Oral TID  . levothyroxine  25 mcg Oral QAC breakfast  . selegiline  5 mg Oral Daily   Continuous Infusions: PRN Meds:.acetaminophen **OR** acetaminophen, ALPRAZolam, dicyclomine, ondansetron **OR** ondansetron (ZOFRAN) IV, oxyCODONE-acetaminophen    Objective: Weight change:   Intake/Output Summary (Last 24 hours) at 11/25/2018 1744 Last data filed at 11/25/2018 1230 Gross per 24 hour  Intake 480 ml  Output -  Net 480 ml   Blood pressure (!) 115/40, pulse 74, temperature 97.9 F (36.6 C), temperature source Oral, resp. rate 12, height 4\' 11"  (1.499 m), weight 50.5 kg, SpO2 100 %. Temp:  [97.8 F (36.6 C)-98.6 F (37 C)] 97.9 F (36.6 C) (05/04 1342) Pulse Rate:  [74-81] 74 (05/04 1342) Resp:  [12-18] 12 (05/04 1342) BP: (106-115)/(40-44) 115/40 (05/04 1342) SpO2:  [94 %-100 %] 100 % (05/04 1342) Weight:  [49 kg] 49 kg (05/04 0752)  Physical Exam: General: Alert and awake, oriented x3, not in any acute distress. HEENT: anicteric sclera, EOMI CVS regular rate, normal  Chest: , no wheezing, no respiratory distress Abdomen: soft non-distended,  Extremities: no edema or deformity noted bilaterally Skin: no rashes Neuro: nonfocal  CBC:    BMET Recent Labs    11/24/18 0600 11/25/18 0505  NA 141 140  K 3.3* 4.0  CL 105 107  CO2 28 27  GLUCOSE 93 99  BUN 6* 8   CREATININE 0.66 0.61  CALCIUM 8.6* 8.5*     Liver Panel  Recent Labs    11/24/18 0600 11/25/18 0505  PROT 5.9* 6.1*  ALBUMIN 3.0* 2.9*  AST 17 22  ALT 12 12  ALKPHOS 68 63  BILITOT 1.3* 0.9       Sedimentation Rate No results for input(s): ESRSEDRATE in the last 72 hours. C-Reactive Protein No results for input(s): CRP in the last 72 hours.  Micro Results: Recent Results (from the past 720 hour(s))  Urine culture     Status: Abnormal   Collection Time: 11/21/18  9:57 PM  Result Value Ref Range Status   Specimen Description   Final    URINE, RANDOM Performed at [facility] [address] [place], [place] [address]    Special Requests   Final    NONE Performed at [facility], [place] [address], [place], [place] [address]    Culture >=100,000 COLONIES/mL KLEBSIELLA PNEUMONIAE (A)  Final   Report Status 11/24/2018 FINAL  Final   Organism ID, Bacteria KLEBSIELLA PNEUMONIAE (A)  Final      Susceptibility   Klebsiella pneumoniae - MIC*    AMPICILLIN >=32 RESISTANT Resistant     CEFAZOLIN <=4 SENSITIVE Sensitive     CEFTRIAXONE <=1 SENSITIVE Sensitive     CIPROFLOXACIN <=0.25 SENSITIVE Sensitive     GENTAMICIN <=1 SENSITIVE Sensitive  IMIPENEM <=0.25 SENSITIVE Sensitive     NITROFURANTOIN 64 INTERMEDIATE Intermediate     TRIMETH/SULFA <=20 SENSITIVE Sensitive     AMPICILLIN/SULBACTAM 4 SENSITIVE Sensitive     PIP/TAZO <=4 SENSITIVE Sensitive     Extended ESBL NEGATIVE Sensitive     * >=100,000 COLONIES/mL KLEBSIELLA PNEUMONIAE  Culture, blood (routine x 2)     Status: None (Preliminary result)   Collection Time: 11/22/18 11:24 AM  Result Value Ref Range Status   Specimen Description   Final    BLOOD RIGHT ARM Performed at [facility], [place] [address] [place], [place] [address]    Special Requests   Final    BOTTLES DRAWN AEROBIC AND ANAEROBIC Blood Culture results may not be optimal due to an excessive volume  of blood received in culture bottles Performed at [facility] [address] [place], [place] [address]    Culture   Final    NO GROWTH 3 DAYS Performed at [facility], [place] [address], [place], [place] [address]    Report Status PENDING  Incomplete  Culture, blood (routine x 2)     Status: None (Preliminary result)   Collection Time: 11/22/18 11:24 AM  Result Value Ref Range Status   Specimen Description   Final    BLOOD RIGHT HAND Performed at [facility], [place] [address] [place], [place] [address]    Special Requests   Final    BOTTLES DRAWN AEROBIC ONLY Blood Culture adequate volume Performed at [facility] [address], [place], [place] [address]    Culture   Final    NO GROWTH 3 DAYS Performed at [facility], [place] [address], [place], [place] [address]    Report Status PENDING  Incomplete  SARS Coronavirus 2 [facility] order, Performed in [place] hospital lab)     Status: None   Collection Time: 11/24/18  9:15 AM  Result Value Ref Range Status   SARS Coronavirus 2 NEGATIVE NEGATIVE Final    Comment: (NOTE) If result is NEGATIVE SARS-CoV-2 target nucleic acids are NOT DETECTED. The SARS-CoV-2 RNA is generally detectable in upper and lower  respiratory specimens during the acute phase of infection. The lowest  concentration of SARS-CoV-2 viral copies this assay can detect is 250  copies / mL. A negative result does not preclude SARS-CoV-2 infection  and should not be used as the sole basis for treatment or other  patient management decisions.  A negative result may occur with  improper specimen collection / handling, submission of specimen other  than nasopharyngeal swab, presence of viral mutation(s) within the  areas targeted by this assay, and inadequate number of viral copies  (<250 copies / mL). A negative result must be combined with clinical  observations, patient history, and epidemiological  information. If result is POSITIVE SARS-CoV-2 target nucleic acids are DETECTED. The SARS-CoV-2 RNA is generally detectable in upper and lower  respiratory specimens dur ing the acute phase of infection.  Positive  results are indicative of active infection with SARS-CoV-2.  Clinical  correlation with patient history and other diagnostic information is  necessary to determine patient infection status.  Positive results do  not rule out bacterial infection or co-infection with other viruses. If result is PRESUMPTIVE POSTIVE SARS-CoV-2 nucleic acids MAY BE PRESENT.   A presumptive positive result was obtained on the submitted specimen  and confirmed on repeat testing.  While 2019 novel coronavirus  (SARS-CoV-2) nucleic  acids may be present in the submitted sample  additional confirmatory testing may be necessary for epidemiological  and / or clinical management purposes  to differentiate between  SARS-CoV-2 and other Sarbecovirus currently known to infect humans.  If clinically indicated additional testing with an alternate test  methodology [phone]) is advised. The SARS-CoV-2 RNA is generally  detectable in upper and lower respiratory sp ecimens during the acute  phase of infection. The expected result is Negative. Fact Sheet for Patients:  StrictlyIdeas.no Fact Sheet for Healthcare Providers: BankingDealers.co.za This test is not yet approved or cleared by the [place] FDA and has been authorized for detection and/or diagnosis of SARS-CoV-2 by FDA under an Emergency Use Authorization (EUA).  This EUA will remain in effect (meaning this test can be used) for the duration of the COVID-19 declaration under Section 564(b)(1) of the Act, 21 U.S.C. section 360bbb-3(b)(1), unless the authorization is terminated or revoked sooner. Performed at [facility], [place] [address], [place], [place] [address]      Studies/Results: No results found.    Assessment/Plan:  INTERVAL HISTORY: pt doing well off antibiotics   Active Problems:   Hypothyroidism   Parkinson's disease ([place])   Acute lower UTI   Ischemic colitis ([place])   Leukocytosis    Tracy Malone is a 85 y.o. female with with ischemic colitis and leukocytosis, initially treated with antimicrobials though we feel confident that her entire presentation is due to ischemic colitis and not due to an active infectious process.  She has done well off antibiotics  #1 ischemic colitis: Continuing to improve  #2 penicillin allergy: She states that she had an allergic reaction with a rash that developed while she was being treated for Helicobacter pylori with erythromycin and a penicillin.  She also states she developed welts on the bottom of her feet for 5 days into treatment with cephalexin in the past.  She did not have a reaction to Zosyn while she was here.  Nonetheless we should be cautious with beta lactams with her.   LOS: 3 days   Alcide Evener 11/25/2018, 5:44 PM

## 2018-11-25 NOTE — Discharge Instructions (Signed)
Ischemic Colitis    Ischemic colitis is damage to the large intestine due to reduced blood flow (ischemia) to the colon. The colon is the last section of the large intestine, where stool is formed. The reduced blood flow may lead to the death of cells (necrosis) in the lining of the colon, damaging the colon and often causing bleeding.  Most cases of ischemic colitis clear up in a few days with treatment. In other cases, blood flow does not improve, and parts of the colon start to die. This is extremely serious and even life-threatening. If this happens, surgery may be required. In some cases, parts of the colon may need to be removed.  What are the causes?  Ischemic colitis results from a decrease in the blood supply to the colon. Many conditions can cause this, such as:  Heart problems that reduce blood flow to the arteries that supply the colon. These include problems such as coronary heart disease, peripheral vascular disease, atrial fibrillation, and congestive heart failure.  Low blood pressure from:  An infection that spreads to the blood (sepsis).  Dehydration or bleeding (shock).  Drugs that narrow blood vessels (vasoconstrictors).  Sometimes the cause is not known.  What increases the risk?  You are more likely to develop this condition if:  You are 60 years of age or older.  You are female.  You have another medical condition, such as:  Heart disease.  Diabetes.  Kidney disease that requires you to be on dialysis.  A disease that causes blood clots.  You are frequently constipated.  You have had surgery on the heart, blood vessels (such as the aorta), or colon.  You take certain medicines or drugs, such as:  Medicines that suppress your immune system (immunomodulators).  Medicines that cause constipation.  Illegal drugs, such as cocaine or methamphetamines.  You get an extreme amount of exercise from long-distance bike riding or running.  What are the signs or symptoms?  Symptoms of this condition start  suddenly and may include:  Dull pain, usually on the left side of the abdomen.  Tenderness of the abdomen.  Abdomen (abdominal) cramps.  An urgent need to have a bowel movement.  Loose, bloody stools with clots of dark or bright red blood.  Nausea and vomiting.  Fever.  Weakness, fatigue, and confusion.  How is this diagnosed?  This condition may be diagnosed based on:  Your symptoms, your medical history, and a physical exam.  Tests to find out more about your condition and to rule out other causes of pain and bleeding. These tests may include:  Blood tests to check for clotting, blood loss, and low proteins in your blood.  CT scan of the colon.  A procedure to examine the inside of your colon using a scope that is passed through the rectum (colonoscopy). Colonoscopy is the most important diagnostic test. During this test, your health care provider may take a small piece of tissue from your colon to be examined under a microscope (biopsy).  How is this treated?  You may be hospitalized for treatment. Treatment usually includes:  Not eating or drinking anything. This allows the colon to rest.  IV fluids to maintain blood pressure, regulate blood minerals (electrolytes), and provide nutrition.  Having a tube inserted into your stomach through your nose (nasogastric tube) to drain your stomach.  IV antibiotic medicines. These may be used if an infection is suspected.  Stopping or changing medicines that may be causing the condition.    You may need surgery if your condition is severe or if it gets worse or does not get better after a few days. Parts of the colon that will not recover may need to be removed. In some cases, a procedure is also done to attach the healthy part of the colon to the outer wall of the abdomen to drain stool (colostomy).  Follow these instructions at home:  Follow instructions from your health care provider about eating or drinking restrictions.  Drink enough fluid to keep your urine clear or  pale yellow.  Take over-the-counter and prescription medicines only as told by your health care provider.  Return to your normal activities as told by your health care provider. Ask your health care provider what activities are safe for you.  Do not use any products that contain nicotine or tobacco, such as cigarettes and e-cigarettes. If you need help quitting, ask your health care provider.  Keep all follow-up visits as told by your health care provider. This is important.  Contact a health care provider if:  You have blood in your stool.  You have abdominal pain or cramps.  You have constipation.  You have nausea or vomiting.  Get help right away if:  You have a moderate to large amount of loose, bloody stools with clots of dark or bright red blood.  You have severe abdominal pain.  Your abdominal pain has not improved after 24 hours.  You have a fever.  You have not been able to have a bowel movement, and you are in pain and vomiting.  You have shortness of breath.  You are very tired (lethargic) or have confusion.  Summary  Ischemic colitis is damage to the large intestine due to reduced blood flow (ischemia) to the colon.  Some of the symptoms of this condition include abdominal pain or tenderness, bloody stools, and an urgent need to have a bowel movement.  Diagnosis usually includes a procedure to examine the inside of the colon using a scope that is passed through the rectum (colonoscopy).  This information is not intended to replace advice given to you by your health care provider. Make sure you discuss any questions you have with your health care provider.  Document Released: 08/28/2016 Document Revised: 08/28/2016 Document Reviewed: 08/28/2016  Elsevier Interactive Patient Education  2019 [employer]

## 2018-11-26 ENCOUNTER — Telehealth: Payer: Self-pay

## 2018-11-26 ENCOUNTER — Encounter: Payer: Self-pay | Admitting: General Surgery

## 2018-11-26 ENCOUNTER — Telehealth: Payer: Self-pay | Admitting: Gastroenterology

## 2018-11-26 NOTE — Telephone Encounter (Signed)
Spoke to pt about hospital follow. Pt says she is feeling about 60 % better. All current and discharged medicine were reconciled. Pt appt is set for 12-02-18 at 2 pm. Pt also would like to know is there a special diet she should follow. Pt would also like to know if she could take a stool softer due to not having a bowel movement since this past Thursday.  Pt is also having some issues with gas and would like to know if it is ok to take Gas-Ex. Please advise. [place]

## 2018-11-26 NOTE — Telephone Encounter (Signed)
Patient's spouse tells me they did not have any missed calls. Wants to speak to nurse before scheduling an appointment. Confirmed the call back number and it's correct [phone]. Will you please call one more time today?

## 2018-11-26 NOTE — Telephone Encounter (Signed)
Patient husband called said that he is wanting to know what should be the next step. PT was at the ED on 4-30 for Colitis.

## 2018-11-26 NOTE — Progress Notes (Signed)
In hosp

## 2018-11-26 NOTE — Telephone Encounter (Signed)
Pt was scheduled to speak with Dr Ardis Hughs tomorrow at 230 pm.

## 2018-11-26 NOTE — Telephone Encounter (Signed)
Tried to call pt husband at number given.  Phone rang several times and then disconnected.  The pt no showed for appt yesterday and needs to reschedule to discuss next steps with Dr Ardis Hughs

## 2018-11-26 NOTE — Telephone Encounter (Signed)
No special diet.  She can use Gas-X and also have her use MiraLAX to help with her BMs.

## 2018-11-26 NOTE — Telephone Encounter (Signed)
Called pt and no answer. Sent pt a my chart message. [place]

## 2018-11-27 ENCOUNTER — Ambulatory Visit (INDEPENDENT_AMBULATORY_CARE_PROVIDER_SITE_OTHER): Payer: Medicare Other | Admitting: Gastroenterology

## 2018-11-27 ENCOUNTER — Other Ambulatory Visit: Payer: Self-pay

## 2018-11-27 ENCOUNTER — Encounter: Payer: Self-pay | Admitting: Gastroenterology

## 2018-11-27 ENCOUNTER — Telehealth: Payer: Self-pay

## 2018-11-27 VITALS — Ht 59.0 in | Wt 108.0 lb

## 2018-11-27 DIAGNOSIS — K559 Vascular disorder of intestine, unspecified: Secondary | ICD-10-CM

## 2018-11-27 DIAGNOSIS — K529 Noninfective gastroenteritis and colitis, unspecified: Secondary | ICD-10-CM | POA: Diagnosis not present

## 2018-11-27 DIAGNOSIS — I6521 Occlusion and stenosis of right carotid artery: Secondary | ICD-10-CM | POA: Diagnosis not present

## 2018-11-27 DIAGNOSIS — N390 Urinary tract infection, site not specified: Secondary | ICD-10-CM | POA: Diagnosis not present

## 2018-11-27 LAB — CULTURE, BLOOD (ROUTINE X 2)
Culture: NO GROWTH
Culture: NO GROWTH
Special Requests: ADEQUATE

## 2018-11-27 NOTE — Telephone Encounter (Signed)
LVM for pt to for In office visit Monday with Dr. Redmond School. Screening needs to be done . Also sent My chart message. [place]

## 2018-11-27 NOTE — Progress Notes (Signed)
Review of previous GI testing; Tracy Malone and changed to Tracy Malone 10/2014:  -CBC, CMP, and TSH were normal in 07/2014 and again in 08/2014. CT scan02/26/2016.This was done without contrast due to dye allergy.This showed no acute findings in the abdomen and pelvis specifically to explain her history of abdominal pain. There is a 3.0 cm left ovarian cystic lesion that is likely benign. As I stated above, she is following with gynecology and they're going to monitor that, but they think that it is benign.Stomach, duodenum, small bowel, and colon grossly unremarkable.Abdominal ultrasound on 02/12/2016showed no acute or chronic abnormalities of the hepatobiliary tree; evaluation of the pancreatic tail was limited. There is no acute abnormalities of the kidney demonstrated. A 1.6 cm diameter mid to lower pole cyst was noted in the right kidney. August 2015 HIDA scan with [facility], which was normal.This was performed for complaints of upper abdominal pain, appetite loss, fatigue, and weight loss at that time as well.EGD was in December 2013at which time she was found to have moderate gastritis. Biopsies actually showed H. Pylori with chronic active gastritis, however, after taking 1 dose of the Pylera medication she discontinued it because of side effects (it made her feel horrible).Therefore, she has not completed treatment for H. Pylori. Duodenal biopsies were unremarkable at that time.flexible sigmoidoscopy also in December 2013that was normal with only medium hemorrhoids noted. This was performed for diarrhea and biopsies of the descending and sigmoid colon were unremarkable. She states that she had a full colonoscopy prior to that with Tracy Malone, however, after calling his office they stated they have never performed a full colonoscopy on her. Gastric emptying scan in January 2014showed 33% of the ingested activity remaining in the stomach which is mildly abnormal compared to the normal range of less than 30%  of the ingested activity remaining at 120 minutes.Repeat GES 3/2016was normal. EGD 11/2014 with Tracy Malone showed pan-gastritis withpositive Hpylori on biopsies; also small hiatal hernia. Saw ID and was apparently treated with subsequent testing negative. Repeat abdominal ultrasound 09/2015 was normal.07/2016 H. Pylori stool antigen was negative. Repeat EGD 08/2016: mild gastritis, + for H pylori again. Referred to ID and tried a regimen but could not complete it due   This service was provided via virtual visit.Only audio was used.  The patient was located at home.  I was located in my office.  The patient did consent to this virtual visit and is aware of possible charges through their insurance for this visit.  The patient is an established patient.  My certified medical assistant, Tracy Malone, contributed to this visit by contacting the patient by phone 1 or 2 business days prior to the appointment and also followed up on the recommendations I made after the visit.  Time spent on virtual visit: 27 min   HPI: This is a pleasant 85 year old woman whom was just discharged from the hospital 2 or 3 days ago.  I spoke with her and her husband on the phone today.  Earlier this week she was hospitalized with diarrhea that progressed to constipation.  CT scan suggested "mild circumferential wall thickening about the transverse colon, which may be related to incomplete distention or possibly mild colitis."  Her symptoms seemed to improve quickly.  Her urinary culture was positive for Klebsiella.  ID was at 1 point consulted and they felt she had ischemic colitis.  It did not feel that she actually had ongoing urinary tract infections, antibiotics were stopped.  She ended up spending  just 2 nights in the hospital.  When she went to the hospital she had severe abd pains, cramping, bloody diarrhea. The diarrhea resolved.  She still has some  lower abdominal discomfort.  She was upset that she didn't get  another CT scan while in the hospital.   She saw Dr. Helane Malone earlier today.  Drew blood tests and urine testing.  Those results should be back tomorrow.  Hematology has been evaluating her chronically elevated WBC, they drew a battery of blood tests last month without any conclusive diagnosis   Chief complaint is recent ischemic colitis, chronic constipation  ROS: complete GI ROS as described in HPI, all other review negative.  Constitutional:  No unintentional weight loss   Past Medical History:  Diagnosis Date  . Bell's palsy 1966   ? right side facial droop  . Carotid artery disease ([place]) 2010   on vascular screening;unchanged 2013.(could not tolerate simvastatin, no other statins tried)--<30% blockage bilat 07/2011  . Chronic abdominal pain   . Chronic fatigue and malaise   . Claustrophobia   . Depression    treated in the past for years;stopped in 2010 for a years  . Duodenal ulcer 1962   h/o  . Dysrhythmia    ocassional PVC's  . Fibromyalgia   . Frequent PVCs 07/2012   Seen by [facility]: benign, asymptomatic, normal EF  . GERD (gastroesophageal reflux disease)   . Glaucoma, narrow-angle    s/p laser surgery  . History of hiatal hernia    during endoscopy  . Hypothyroid 9/08  . IBS (irritable bowel syndrome)    Tracy Malone  . Ocular migraine   . Osteoporosis 10/11   Tracy Malone  . Panic attack   . Recurrent UTI    has cystocele-Tracy Malone  . Shingles 1999   h/o  . Superficial thrombophlebitis 03/2009   RLE  . Trochanteric bursitis 12/2008   bilateral    Past Surgical History:  Procedure Laterality Date  . ABDOMINAL HYSTERECTOMY    . CATARACT EXTRACTION, BILATERAL  1995, 1996  . Flexible sigmoidoscopy    . THYROIDECTOMY, PARTIAL  09/2005   L nodule; Tracy Malone  . TONSILLECTOMY  1946  . UPPER GI ENDOSCOPY  06/27/12  . VAGINAL HYSTERECTOMY  1971   and bladder repair.  Still has ovaries    Current Outpatient Medications  Medication Sig Dispense Refill  .  ALPRAZolam (XANAX) 0.5 MG tablet TAKE BY MOUTH UP TO 3 TIMES DAILY. WARNING: BENZODIAZEPINES INCREASE THE RISK OF FALLS AND NEUROCOGNITIVE DECLINE IN THE ELDERLY (Patient taking differently: Take 0.5 mg by mouth 4 (four) times daily as needed for anxiety. ) 90 tablet 2  . b complex vitamins capsule Take 1 capsule by mouth daily.     . Carbidopa-Levodopa ER (SINEMET CR) 25-100 MG tablet controlled release Take 1 tablet by mouth 4 (four) times daily. (Patient taking differently: Take 1 tablet by mouth 3 (three) times daily. ) 360 tablet 1  . Cholecalciferol (VITAMIN D) 2000 UNITS tablet Take 2,000 Units by mouth daily.    Marland Kitchen dicyclomine (BENTYL) 10 MG capsule Take 1 capsule (10 mg total) by mouth every 6 (six) hours as needed (abdominal discomfort). 30 capsule 0  . Homeopathic Products (AZO CONFIDENCE PO) Take 1 tablet by mouth once.    . ondansetron (ZOFRAN-ODT) 4 MG disintegrating tablet Take 1 tablet (4 mg total) by mouth every 8 (eight) hours as needed for nausea or vomiting. 20 tablet 0  . Probiotic Product (ALIGN PO) Take  1 capsule by mouth daily.     . selegiline (ELDEPRYL) 5 MG capsule Take 5 mg by mouth daily.    Marland Kitchen SYNTHROID 25 MCG tablet TAKE 1 TABLET DAILY BEFORE BREAKFAST (Patient taking differently: Take 25 mcg by mouth daily before breakfast. ) 90 tablet 1  . vitamin E 400 UNIT capsule Take 400 Units by mouth every Monday, Wednesday, and Friday. Reported on 09/06/2015     No current facility-administered medications for this visit.     Allergies as of 11/27/2018 - Review Complete 11/26/2018  Allergen Reaction Noted  . Iodine Anaphylaxis 05/13/2012  . Levsin [hyoscyamine sulfate]  10/13/2015  . Salmon [fish allergy] Hives and Shortness Of Breath 05/13/2012  . Shellfish allergy Anaphylaxis 05/13/2012  . Remeron [mirtazapine] Other (See Comments) 08/25/2016  . Aspirin Other (See Comments) 05/13/2012  . Ciprofloxacin Diarrhea 05/13/2012  . Codeine Nausea And Vomiting 05/13/2012  .  Darvocet [propoxyphene n-acetaminophen] Nausea And Vomiting 05/13/2012  . Demerol [meperidine] Nausea Only 05/13/2012  . Dexilant [dexlansoprazole] Swelling 03/01/2016  . Diphedryl [diphenhydramine] Other (See Comments) 05/13/2012  . Doxycycline hyclate Other (See Comments) 05/13/2012  . Epinephrine Other (See Comments) 05/13/2012  . Erythromycin Other (See Comments) 05/13/2012  . Flexeril [cyclobenzaprine] Other (See Comments) 05/13/2012  . Keflex [cephalexin] Hives 05/13/2012  . Latex Other (See Comments) 05/13/2012  . Nitrofurantoin Diarrhea 11/22/2018  . Prednisone Other (See Comments) 05/13/2012  . Pylera [bis subcit-metronid-tetracyc] Swelling 08/01/2012  . Sulfa antibiotics Other (See Comments) 05/13/2012  . Xylocaine [lidocaine hcl]  05/13/2012  . Zoloft [sertraline hcl] Swelling and Other (See Comments) 05/13/2012  . Advil [ibuprofen] Other (See Comments) 05/13/2012  . Clarithromycin Rash 01/14/2015    Family History  Problem Relation Age of Onset  . Heart disease Mother   . Hypertension Mother   . Hypertension Sister   . HIV Son   . Heart disease Brother   . Lung cancer Brother        lung  . Diabetes Maternal Grandfather   . Diabetes Granddaughter        type 1    Social History   Socioeconomic History  . Marital status: Married    Spouse name: Not on file  . Number of children: 2  . Years of education: Not on file  . Highest education level: Not on file  Occupational History  . Occupation: retired (school system)  Social Needs  . Financial resource strain: Not on file  . Food insecurity:    Worry: Not on file    Inability: Not on file  . Transportation needs:    Medical: Not on file    Non-medical: Not on file  Tobacco Use  . Smoking status: Never Smoker  . Smokeless tobacco: Never Used  Substance and Sexual Activity  . Alcohol use: No    Alcohol/week: 0.0 standard drinks  . Drug use: No  . Sexual activity: Not Currently    Partners: Male   Lifestyle  . Physical activity:    Days per week: Not on file    Minutes per session: Not on file  . Stress: Not on file  Relationships  . Social connections:    Talks on phone: Not on file    Gets together: Not on file    Attends religious service: Not on file    Active member of club or organization: Not on file    Attends meetings of clubs or organizations: Not on file    Relationship status: Not on file  .  Intimate partner violence:    Fear of current or ex partner: Not on file    Emotionally abused: Not on file    Physically abused: Not on file    Forced sexual activity: Not on file  Other Topics Concern  . Not on file  Social History Narrative   Married.  Son lives in [place]; Daughter [name] lives in [place]; 2 grandchildren     Physical Exam: Unable to perform because this was a "telemed visit" due to current Covid-19 pandemic  Assessment and plan: 85 y.o. female with chronic constipation, recent likely ischemic colitis, chronic low-grade leukocytosis  It is certainly likely that she had mild ischemic colitis, her chronic constipation puts her at risk.  I recommended that she start taking MiraLAX on an every day basis rather than just as needed.  She had labs drawn at her gynecologist office this morning and those will be sent to me for my review.  After reviewing them we will contact her with my opinion.  She understands that he can take days or even up to a week or 2 to fully recover from ischemic colitis.  We will arrange for return office visit with me in 2 weeks.  Please see the "Patient Instructions" section for addition details about the plan.  Tracy Loffler, MD [facility] 11/27/2018, 12:03 PM

## 2018-11-27 NOTE — Patient Instructions (Addendum)
She knows to increase her MiraLAX so that she takes a single dose every day not just as needed  We will look out for labs from Dr. Christen Butter office that were drawn this morning  ROV with Dr Ardis Hughs in 2 weeks (telemedicine if needed).

## 2018-11-28 ENCOUNTER — Telehealth: Payer: Self-pay | Admitting: Gastroenterology

## 2018-11-28 NOTE — Telephone Encounter (Signed)
Patient husband called said that his wife had a virtual visit this week. She is currently having Dark black stools that started today and would like to know if that is normal. Callback # [phone]

## 2018-11-28 NOTE — Telephone Encounter (Signed)
The pt was given the recommendations that Dr Ardis Hughs discussed at virtual visit yesterday. "She understands that he can take days or even up to a week or 2 to fully recover from ischemic colitis.  We will arrange for return office visit with me in 2 weeks."  She will call if the stools begin to be bright red or she starts seeing [facility].

## 2018-12-01 ENCOUNTER — Encounter: Payer: Self-pay | Admitting: Family Medicine

## 2018-12-02 ENCOUNTER — Encounter: Payer: Self-pay | Admitting: Family Medicine

## 2018-12-02 ENCOUNTER — Other Ambulatory Visit: Payer: Self-pay

## 2018-12-02 ENCOUNTER — Ambulatory Visit (INDEPENDENT_AMBULATORY_CARE_PROVIDER_SITE_OTHER): Payer: Medicare Other | Admitting: Family Medicine

## 2018-12-02 ENCOUNTER — Telehealth: Payer: Self-pay | Admitting: Gastroenterology

## 2018-12-02 VITALS — BP 126/74 | HR 83 | Temp 98.0°F | Wt 108.0 lb

## 2018-12-02 DIAGNOSIS — Z87440 Personal history of urinary (tract) infections: Secondary | ICD-10-CM

## 2018-12-02 DIAGNOSIS — K5909 Other constipation: Secondary | ICD-10-CM | POA: Diagnosis not present

## 2018-12-02 DIAGNOSIS — D72829 Elevated white blood cell count, unspecified: Secondary | ICD-10-CM

## 2018-12-02 DIAGNOSIS — K559 Vascular disorder of intestine, unspecified: Secondary | ICD-10-CM | POA: Diagnosis not present

## 2018-12-02 LAB — POCT URINALYSIS DIP (PROADVANTAGE DEVICE)
Bilirubin, UA: NEGATIVE
Blood, UA: NEGATIVE
Glucose, UA: NEGATIVE mg/dL
Ketones, POC UA: NEGATIVE mg/dL
Leukocytes, UA: NEGATIVE
Nitrite, UA: NEGATIVE
Protein Ur, POC: NEGATIVE mg/dL
Specific Gravity, Urine: 1.005
Urobilinogen, Ur: 3.5
pH, UA: 6 (ref 5.0–8.0)

## 2018-12-02 NOTE — Progress Notes (Signed)
   Subjective:    Patient ID: Tracy Malone, female    DOB: April 21, 1932, 85 y.o.   MRN: [id number]  HPI She is here for follow-up visit after recent hospitalization.  She was found to have ischemic colitis.  There was also a previous history of UTI.  She also has a history of chronic constipation and has subsequently been placed on MiraLAX.  While in the hospital was also noted that she had leukocytosis but this was about subsequently to be secondary to the ischemic colitis.  She was seen by GI and infectious disease.  Presently she does not complain of nausea, vomiting and only occasional difficulty with abdominal cramping.  She is not using Bentyl nor has she used Zofran.  No frequency, dysuria.  She states that her bowel movements have essentially returned to normal.   Review of Systems     Objective:   Physical Exam Alert and in no distress.  Medical record including lab work, x-ray and medical discharge was reviewed. Cardiac exam shows regular rhythm without murmurs or gallops.  Lungs are clear to auscultation.  Abdominal exam shows no masses or tenderness with decreased bowel sounds.  Urinalysis was negative.      Assessment & Plan:  Ischemic colitis ([place]) - Plan: CBC with Differential/Platelet, Comprehensive metabolic panel  History of UTI - Plan: POCT Urinalysis DIP (Proadvantage Device), CANCELED: Urinalysis Dipstick  Chronic constipation  Leukocytosis, unspecified type - Plan: CBC with Differential/Platelet She will continue with her present medication regimen and I will call when I get to blood work back. Over 30 minutes was spent in evaluation and management as well as consultation.

## 2018-12-02 NOTE — Telephone Encounter (Signed)
The pt has been advised to advance her diet as she can tolerate.  The pt has been advised of the information and verbalized understanding.

## 2018-12-02 NOTE — Patient Outreach (Signed)
[facility] [employer] [facility]) Care Management  12/02/2018  Tracy Malone [birth year] [id number]   EMMI- General Discharge RED ON EMMI ALERT Day # 4 Date: 11/30/2018 Red Alert Reason:  Got discharge papers? I Don't Know  Know who to call about changes in condition? No  Lost interest in things? Yes  Sad/hopeless/anxious/empty? Yes  Other questions/problems? Yes    Outreach attempt: spoke with patient.  She states she is doing ok but not her best.  Addressed red alerts with patient.  She states she did get her discharge papers and has read them.  She expressed some dissatisfaction as they did not provide her with diet instructions as what she can eat and not eat.  Discussed with patient diet and what she can eat.  She verbalized understanding and advised that  CM will send information given.  Patient states that she has spoken with there GI and will be following up him again as well. Patient states that her physician is on leave and was not sure who to call if she had problems. Advised patient that PCP office has someone who will cover for her PCP.  She states that they do and she will be meeting with him today Dr. Redmond School.  Patient admits to some down feelings and frustration with her recent colitis but is dealing with it.  She denies any other problems with depression.  Patient declined any further needs and is appreciative of call.    Plan: RN CM will send EMMI Educational information reviewed on phone with patient.   RN CM will close case.    Jone Baseman, RN, MSN [facility] Care Management Care Management Coordinator Direct Line [phone] Toll Free: [phone]  Fax: [phone]

## 2018-12-03 LAB — COMPREHENSIVE METABOLIC PANEL
ALT: 9 IU/L (ref 0–32)
AST: 14 IU/L (ref 0–40)
Albumin/Globulin Ratio: 1.6 (ref 1.2–2.2)
Albumin: 4.2 g/dL (ref 3.6–4.6)
Alkaline Phosphatase: 76 IU/L (ref 39–117)
BUN/Creatinine Ratio: 18 (ref 12–28)
BUN: 11 mg/dL (ref 8–27)
Bilirubin Total: 0.4 mg/dL (ref 0.0–1.2)
CO2: 24 mmol/L (ref 20–29)
Calcium: 9.5 mg/dL (ref 8.7–10.3)
Chloride: 102 mmol/L (ref 96–106)
Creatinine, Ser: 0.62 mg/dL (ref 0.57–1.00)
GFR calc Af Amer: 95 mL/min/{1.73_m2} (ref >59)
GFR calc non Af Amer: 82 mL/min/{1.73_m2} (ref >59)
Globulin, Total: 2.6 g/dL (ref 1.5–4.5)
Glucose: 101 mg/dL — ABNORMAL HIGH (ref 65–99)
Potassium: 4.6 mmol/L (ref 3.5–5.2)
Sodium: 139 mmol/L (ref 134–144)
Total Protein: 6.8 g/dL (ref 6.0–8.5)

## 2018-12-03 LAB — CBC WITH DIFFERENTIAL/PLATELET
Basophils Absolute: 0.1 10*3/uL (ref 0.0–0.2)
Basos: 1 %
EOS (ABSOLUTE): 0.2 10*3/uL (ref 0.0–0.4)
Eos: 2 %
Hematocrit: 42.6 % (ref 34.0–46.6)
Hemoglobin: 14.5 g/dL (ref 11.1–15.9)
Immature Grans (Abs): 0.2 10*3/uL — ABNORMAL HIGH (ref 0.0–0.1)
Immature Granulocytes: 1 %
Lymphocytes Absolute: 2.5 10*3/uL (ref 0.7–3.1)
Lymphs: 20 %
MCH: 30.2 pg (ref 26.6–33.0)
MCHC: 34 g/dL (ref 31.5–35.7)
MCV: 89 fL (ref 79–97)
Monocytes Absolute: 1.1 10*3/uL — ABNORMAL HIGH (ref 0.1–0.9)
Monocytes: 9 %
Neutrophils Absolute: 8.3 10*3/uL — ABNORMAL HIGH (ref 1.4–7.0)
Neutrophils: 67 %
Platelets: 411 10*3/uL (ref 150–450)
RBC: 4.8 x10E6/uL (ref 3.77–5.28)
RDW: 12.1 % (ref 11.7–15.4)
WBC: 12.4 10*3/uL — ABNORMAL HIGH (ref 3.4–10.8)

## 2018-12-04 ENCOUNTER — Telehealth: Payer: Self-pay | Admitting: Gastroenterology

## 2018-12-04 ENCOUNTER — Other Ambulatory Visit: Payer: Medicare Other | Admitting: Licensed Clinical Social Worker

## 2018-12-04 ENCOUNTER — Other Ambulatory Visit: Payer: Self-pay

## 2018-12-04 ENCOUNTER — Encounter: Payer: Self-pay | Admitting: Family Medicine

## 2018-12-04 DIAGNOSIS — Z515 Encounter for palliative care: Secondary | ICD-10-CM

## 2018-12-04 NOTE — Telephone Encounter (Signed)
I reviewed labs from Nov 27, 2018  CBC showed her usual a slightly elevated white blood cell count at 14.5 thousand.  Hemoglobin 14.6.  Complete metabolic profile was completely normal except for slightly elevated blood glucose level    Please call her, let her know that I reviewed her labs from last week, Dr.Grewal's appointment, they all look good.  No changes from what she and I discussed at her visit last week.

## 2018-12-04 NOTE — Telephone Encounter (Signed)
Patient notified. She is aware that Dr Ardis Hughs reviewed her labs from Dr Valentino Hue. All questions answered. Patient voiced understanding.

## 2018-12-04 NOTE — Progress Notes (Signed)
COMMUNITY PALLIATIVE CARE SW NOTE  PATIENT NAME: Tracy Malone DOB: 1931/09/28 MRN: [id number]  PRIMARY CARE PROVIDER: [name], MD  RESPONSIBLE PARTY:  Acct ID - Guarantor Home Phone Work Phone Relationship Acct Type  [id number] Tracy [phone] Self P/F     [address], [place], [name] [address]   Due to the COVID-19 crisis, this virtual check-in visit was done via telephone from my office and it was initiated and consent given by thispatient.  PLAN OF CARE and INTERVENTIONS:             1. GOALS OF CARE/ ADVANCE CARE PLANNING:  Patient wishes to remain in her home with her husband.  She is a Full Code. 2. SOCIAL/EMOTIONAL/SPIRITUAL ASSESSMENT/ INTERVENTIONS:  SW conducted a virtual check-in visit with patient in her home.  She stated she was recently hospitalized due to bowel issues.  SW provided active listening and supportive counseling while she discussed her medical status.  Her husband, Tracy Malone, is currently caring for her at home.  She stated she would feel better in a few weeks and asked for another visit then. 3. PATIENT/CAREGIVER EDUCATION/ COPING:  Patient copes by expressing her feelings openly. 4. PERSONAL EMERGENCY PLAN:  She will inform her husband or daughter and proceed accordingly. 5. COMMUNITY RESOURCES COORDINATION/ HEALTH CARE NAVIGATION:  She has assistance with housekeeping. 6. FINANCIAL/LEGAL CONCERNS/INTERVENTIONS:  None.     SOCIAL HX:  Social History   Tobacco Use  . Smoking status: Never Smoker  . Smokeless tobacco: Never Used  Substance Use Topics  . Alcohol use: No    Alcohol/week: 0.0 standard drinks    CODE STATUS:  Full Code  ADVANCED DIRECTIVES: N MOST FORM COMPLETE:  N HOSPICE EDUCATION PROVIDED: N PPS:  Patient reports her appetite is normal. Duration of visit and documentation:  30 minutes.     Creola Corn Tracy Ravan, LCSW

## 2018-12-10 ENCOUNTER — Ambulatory Visit: Payer: Medicare Other | Admitting: Gastroenterology

## 2018-12-11 ENCOUNTER — Encounter: Payer: Self-pay | Admitting: Family Medicine

## 2018-12-11 ENCOUNTER — Ambulatory Visit: Payer: Medicare Other | Admitting: Podiatry

## 2018-12-12 ENCOUNTER — Ambulatory Visit (INDEPENDENT_AMBULATORY_CARE_PROVIDER_SITE_OTHER): Payer: Medicare Other | Admitting: Gastroenterology

## 2018-12-12 ENCOUNTER — Encounter: Payer: Self-pay | Admitting: Gastroenterology

## 2018-12-12 ENCOUNTER — Other Ambulatory Visit: Payer: Self-pay

## 2018-12-12 VITALS — Ht 59.0 in | Wt 108.0 lb

## 2018-12-12 DIAGNOSIS — K559 Vascular disorder of intestine, unspecified: Secondary | ICD-10-CM | POA: Diagnosis not present

## 2018-12-12 DIAGNOSIS — I6521 Occlusion and stenosis of right carotid artery: Secondary | ICD-10-CM

## 2018-12-12 DIAGNOSIS — K219 Gastro-esophageal reflux disease without esophagitis: Secondary | ICD-10-CM | POA: Diagnosis not present

## 2018-12-12 NOTE — Progress Notes (Signed)
Review of previous GI testing; Dr. Benson Norway and changed to Dr. Ardis Hughs 10/2014:  -CBC, CMP, and TSH were normal in 07/2014 and again in 08/2014. CT scan02/26/2016.This was done without contrast due to dye allergy.This showed no acute findings in the abdomen and pelvis specifically to explain her history of abdominal pain. There is a 3.0 cm left ovarian cystic lesion that is likely benign. As I stated above, she is following with gynecology and they're going to monitor that, but they think that it is benign.Stomach, duodenum, small bowel, and colon grossly unremarkable.Abdominal ultrasound on 02/12/2016showed no acute or chronic abnormalities of the hepatobiliary tree; evaluation of the pancreatic tail was limited. There is no acute abnormalities of the kidney demonstrated. A 1.6 cm diameter mid to lower pole cyst was noted in the right kidney. August 2015 HIDA scan with [facility], which was normal.This was performed for complaints of upper abdominal pain, appetite loss, fatigue, and weight loss at that time as well.EGD was in December 2013at which time she was found to have moderate gastritis. Biopsies actually showed H. Pylori with chronic active gastritis, however, after taking 1 dose of the Pylera medication she discontinued it because of side effects (it made her feel horrible).Therefore, she has not completed treatment for H. Pylori. Duodenal biopsies were unremarkable at that time.flexible sigmoidoscopy also in December 2013that was normal with only medium hemorrhoids noted. This was performed for diarrhea and biopsies of the descending and sigmoid colon were unremarkable. She states that she had a full colonoscopy prior to that with Dr. Benson Norway, however, after calling his office they stated they have never performed a full colonoscopy on her. Gastric emptying scan in January 2014showed 33% of the ingested activity remaining in the stomach which is mildly abnormal compared to the normal range of less than 30%  of the ingested activity remaining at 120 minutes.Repeat GES 3/2016was normal. EGD 11/2014 with Dr. Ardis Hughs showed pan-gastritis withpositive Hpylori on biopsies; also small hiatal hernia. Saw ID and was apparently treated with subsequent testing negative. Repeat abdominal ultrasound 09/2015 was normal.07/2016 H. Pylori stool antigen was negative.Repeat EGD 08/2016: mild gastritis, + for H pylori again. Referred to ID and tried a regimen but could not complete it due  2.  Presumed ischemic colitis.  April 2020, CT scan suggested "mild circumferential wall thickening about the transverse colon, which may be related to incomplete distention or possibly mild colitis".     This service was provided via virtual visit.   Only audio was used.  The patient was located at home.  I was located in my office.  The patient did consent to this virtual visit and is aware of possible charges through their insurance for this visit.  The patient is an established patient.  Her husband was on the phone as well.  My certified medical assistant, Grace Bushy, contributed to this visit by contacting the patient by phone 1 or 2 business days prior to the appointment and also followed up on the recommendations I made after the visit.  Time spent on virtual visit: 23 minutes   HPI: This is a very pleasant 85 year old woman  I last saw her by telemedicine visit about 3 weeks ago.  She had just recently been hospitalized for what seemed like it was probably mild ischemic colitis.  Certainly her chronic constipation puts her at risk for that.  Recommended that she start taking MiraLAX on an every day basis rather than just as needed.  She had blood work repeated a few  days after her visit with me confirming a normal CBC for her, chronically mildly elevated white count, and a normal complete metabolic profile.  Urinalysis was normal.  She feels slightly better.  Still eating softer foods and small meals.  She takes  miralax every day, once per day.  Her bowels are better, at least this helps her constipation.  She is still quite fatigued.  Her husband I spoke after I spoke with her and he thinks that she worries a lot about her overall health and he thinks that might be part of the problem, he also thinks she has been a bit depressed.   Chief complaint is fatigue, constipation, recent ischemic colitis  ROS: complete GI ROS as described in HPI, all other review negative.  Constitutional:  No unintentional weight loss   Past Medical History:  Diagnosis Date  . Bell's palsy 1966   ? right side facial droop  . Carotid artery disease ([place]) 2010   on vascular screening;unchanged 2013.(could not tolerate simvastatin, no other statins tried)--<30% blockage bilat 07/2011  . Chronic abdominal pain   . Chronic fatigue and malaise   . Claustrophobia   . Depression    treated in the past for years;stopped in 2010 for a years  . Duodenal ulcer 1962   h/o  . Dysrhythmia    ocassional PVC's  . Fibromyalgia   . 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 GERD (gastroesophageal reflux disease)   . Glaucoma, narrow-angle    s/p laser surgery  . History of hiatal hernia    during endoscopy  . Hypothyroid 9/08  . IBS (irritable bowel syndrome)    Dr. Benson Norway  . Ischemic colitis ([place]) 11/21/2018  . Ocular migraine   . Osteoporosis 10/11   Dr.Hawkes  . Panic attack   . Recurrent UTI    has cystocele-Dr.Grewal  . Shingles 1999   h/o  . Superficial thrombophlebitis 03/2009   RLE  . Trochanteric bursitis 12/2008   bilateral    Past Surgical History:  Procedure Laterality Date  . ABDOMINAL HYSTERECTOMY    . CATARACT EXTRACTION, BILATERAL  1995, 1996  . Flexible sigmoidoscopy    . THYROIDECTOMY, PARTIAL  09/2005   L nodule; Dr. Harlow Asa  . TONSILLECTOMY  1946  . UPPER GI ENDOSCOPY  06/27/12  . VAGINAL HYSTERECTOMY  1971   and bladder repair.  Still has ovaries     Current Outpatient Medications  Medication Sig Dispense Refill  . ALPRAZolam (XANAX) 0.5 MG tablet TAKE BY MOUTH UP TO 3 TIMES DAILY. WARNING: BENZODIAZEPINES INCREASE THE RISK OF FALLS AND NEUROCOGNITIVE DECLINE IN THE ELDERLY (Patient taking differently: Take 0.5 mg by mouth 4 (four) times daily as needed for anxiety. ) 90 tablet 2  . b complex vitamins capsule Take 1 capsule by mouth daily.     . Carbidopa-Levodopa ER (SINEMET CR) 25-100 MG tablet controlled release Take 1 tablet by mouth 4 (four) times daily. (Patient taking differently: Take 1 tablet by mouth 3 (three) times daily. ) 360 tablet 1  . Cholecalciferol (VITAMIN D) 2000 UNITS tablet Take 2,000 Units by mouth daily.    Marland Kitchen dicyclomine (BENTYL) 10 MG capsule Take 1 capsule (10 mg total) by mouth every 6 (six) hours as needed (abdominal discomfort). 30 capsule 0  . ondansetron (ZOFRAN-ODT) 4 MG disintegrating tablet Take 1 tablet (4 mg total) by mouth every 8 (eight) hours as needed for nausea or vomiting. 20 tablet 0  .  Probiotic Product (ALIGN PO) Take 1 capsule by mouth daily.     Marland Kitchen SYNTHROID 25 MCG tablet TAKE 1 TABLET DAILY BEFORE BREAKFAST (Patient taking differently: Take 25 mcg by mouth daily before breakfast. ) 90 tablet 1  . vitamin E 400 UNIT capsule Take 400 Units by mouth every Monday, Wednesday, and Friday. Reported on 09/06/2015     No current facility-administered medications for this visit.     Allergies as of 12/12/2018 - Review Complete 12/12/2018  Allergen Reaction Noted  . Iodine Anaphylaxis 05/13/2012  . Levsin [hyoscyamine sulfate]  10/13/2015  . Salmon [fish allergy] Hives and Shortness Of Breath 05/13/2012  . Shellfish allergy Anaphylaxis 05/13/2012  . Remeron [mirtazapine] Other (See Comments) 08/25/2016  . Aspirin Other (See Comments) 05/13/2012  . Ciprofloxacin Diarrhea 05/13/2012  . Codeine Nausea And Vomiting 05/13/2012  . Darvocet [propoxyphene n-acetaminophen] Nausea And Vomiting 05/13/2012   . Demerol [meperidine] Nausea Only 05/13/2012  . Dexilant [dexlansoprazole] Swelling 03/01/2016  . Diphedryl [diphenhydramine] Other (See Comments) 05/13/2012  . Doxycycline hyclate Other (See Comments) 05/13/2012  . Epinephrine Other (See Comments) 05/13/2012  . Erythromycin Other (See Comments) 05/13/2012  . Flexeril [cyclobenzaprine] Other (See Comments) 05/13/2012  . Keflex [cephalexin] Hives 05/13/2012  . Latex Other (See Comments) 05/13/2012  . Nitrofurantoin Diarrhea 11/22/2018  . Prednisone Other (See Comments) 05/13/2012  . Pylera [bis subcit-metronid-tetracyc] Swelling 08/01/2012  . Sulfa antibiotics Other (See Comments) 05/13/2012  . Xylocaine [lidocaine hcl]  05/13/2012  . Zoloft [sertraline hcl] Swelling and Other (See Comments) 05/13/2012  . Advil [ibuprofen] Other (See Comments) 05/13/2012  . Clarithromycin Rash 01/14/2015    Family History  Problem Relation Age of Onset  . Heart disease Mother   . Hypertension Mother   . Hypertension Sister   . HIV Son   . Heart disease Brother   . Lung cancer Brother        lung  . Diabetes Maternal Grandfather   . Diabetes Granddaughter        type 1    Social History   Socioeconomic History  . Marital status: Married    Spouse name: Not on file  . Number of children: 2  . Years of education: Not on file  . Highest education level: Not on file  Occupational History  . Occupation: retired (school system)  Social Needs  . Financial resource strain: Not on file  . Food insecurity:    Worry: Not on file    Inability: Not on file  . Transportation needs:    Medical: Not on file    Non-medical: Not on file  Tobacco Use  . Smoking status: Never Smoker  . Smokeless tobacco: Never Used  Substance and Sexual Activity  . Alcohol use: No    Alcohol/week: 0.0 standard drinks  . Drug use: No  . Sexual activity: Not Currently    Partners: Male  Lifestyle  . Physical activity:    Days per week: Not on file    Minutes  per session: Not on file  . Stress: Not on file  Relationships  . Social connections:    Talks on phone: Not on file    Gets together: Not on file    Attends religious service: Not on file    Active member of club or organization: Not on file    Attends meetings of clubs or organizations: Not on file    Relationship status: Not on file  . Intimate partner violence:    Fear of current or  ex partner: Not on file    Emotionally abused: Not on file    Physically abused: Not on file    Forced sexual activity: Not on file  Other Topics Concern  . Not on file  Social History Narrative   Married.  Son lives in [place]; Daughter [name] lives in [place]; 2 grandchildren     Physical Exam: Unable to perform because this was a "telemed visit" due to current Covid-19 pandemic  Assessment and plan: 85 y.o. female with recent ischemic colitis, fatigue  I think it is very helpful that she continue taking MiraLAX every single day to keep her from becoming constipated again.  I do think it is a very good chance that the constipation which she was having caused her mild ischemic colitis.  Daily MiraLAX seems to be helping quite well.  I encouraged her to increase her diet to a greater variety of foods.  She has been reluctant and I tried to reassure her that getting back to her normal diet is probably safe now.  She is having nocturnal pyrosis, almost water brash and so I recommended bedtime H2 blocker on a nightly basis.  Return office visit with me in 4 5 weeks, hopefully in person by then  Please see the "Patient Instructions" section for addition details about the plan.  Owens Loffler, MD [facility] 12/12/2018, 3:18 PM

## 2018-12-12 NOTE — Patient Instructions (Addendum)
  She knows to continue eating several small meals per day, 5 or 6.  She will also continue taking MiraLAX every day to try to prevent constipation which I believe is why she had her ischemic colitis episode  She will try Pepcid or famotidine 20 mg at bedtime every night for nocturnal pyrosis.  We will arrange return office visit with me hopefully in person in 4 weeks from now  Thank you for entrusting me with your care and choosing [facility].  Dr Ardis Hughs

## 2018-12-17 ENCOUNTER — Telehealth: Payer: Self-pay | Admitting: Gastroenterology

## 2018-12-17 NOTE — Telephone Encounter (Signed)
Pts husband wanted to know if pt should continue to take miralax every day due to not eating much. Her Levodopa meds cause constipation so discussed with him she should continue the miralax. He states one BM seemed to be loosed, discussed with him if the stool is loose she could try 1/2 dose of miralax a day to see if that helped. He also wanted to know the name of med recommended for reflux. Discussed with him it was recommended she take pepcid 20mg  at night. Husband verbalized understanding.

## 2018-12-17 NOTE — Telephone Encounter (Signed)
Pt's husband reported that pt was in the hospital for colitis.  He requested a call back to discuss medications.

## 2018-12-18 ENCOUNTER — Other Ambulatory Visit: Payer: Medicare Other | Admitting: Licensed Clinical Social Worker

## 2018-12-18 ENCOUNTER — Other Ambulatory Visit: Payer: Self-pay

## 2018-12-18 DIAGNOSIS — Z515 Encounter for palliative care: Secondary | ICD-10-CM

## 2018-12-19 DIAGNOSIS — I8312 Varicose veins of left lower extremity with inflammation: Secondary | ICD-10-CM | POA: Diagnosis not present

## 2018-12-19 DIAGNOSIS — M79604 Pain in right leg: Secondary | ICD-10-CM | POA: Diagnosis not present

## 2018-12-19 DIAGNOSIS — Z6822 Body mass index (BMI) 22.0-22.9, adult: Secondary | ICD-10-CM | POA: Diagnosis not present

## 2018-12-19 DIAGNOSIS — Z01419 Encounter for gynecological examination (general) (routine) without abnormal findings: Secondary | ICD-10-CM | POA: Diagnosis not present

## 2018-12-19 DIAGNOSIS — M79605 Pain in left leg: Secondary | ICD-10-CM | POA: Diagnosis not present

## 2018-12-19 DIAGNOSIS — I8311 Varicose veins of right lower extremity with inflammation: Secondary | ICD-10-CM | POA: Diagnosis not present

## 2018-12-19 NOTE — Progress Notes (Signed)
COMMUNITY PALLIATIVE CARE SW NOTE  PATIENT NAME: Tracy Malone DOB: [birth year] MRN: [id number]  PRIMARY CARE PROVIDER: [name], MD  RESPONSIBLE PARTY:  Acct ID - Guarantor Home Phone Work Phone Relationship Acct Type  [id number] [name] Self P/F     [address] [place],  [address]   Due to the COVID-19 crisis, this virtual check-in visit was done via telephone from my office and it was initiated and consent given by thispatient.  PLAN OF CARE and INTERVENTIONS:             1. GOALS OF CARE/ ADVANCE CARE PLANNING:  Goal is for patient to remain at home with her husband, [name].  Patient is a full code. 2. SOCIAL/EMOTIONAL/SPIRITUAL ASSESSMENT/ INTERVENTIONS:  SW conducted a virtual check-in visit with patient.  Another virtual check-in visit is scheduled for Friday, May 29, with the Palliative Care RN, [name].  Patient stated she is feeling better from her hospitalization recently.  She is sleeping well and napping during the day.  She has to monitor her diet carefully.  SW provided active listening and supportive counseling while she discussed her thoughts about future procedures.   3. PATIENT/CAREGIVER EDUCATION/ COPING:  Patient copes by expressing her feelings openly. 4. PERSONAL EMERGENCY PLAN:  She will rest in bed when she is fatigued.  She will also inform her husband or daughter when she needs assistance. 5. COMMUNITY RESOURCES COORDINATION/ HEALTH CARE NAVIGATION:  Patient has a housekeeper. 6. FINANCIAL/LEGAL CONCERNS/INTERVENTIONS:  None.     SOCIAL HX:  Social History   Tobacco Use  . Smoking status: Never Smoker  . Smokeless tobacco: Never Used  Substance Use Topics  . Alcohol use: No    Alcohol/week: 0.0 standard drinks    CODE STATUS:  Full Code  ADVANCED DIRECTIVES: N MOST FORM COMPLETE:  N HOSPICE EDUCATION PROVIDED:  N PPS:  Appetite is normal.  Patient ambulates independently. Duration of visit and  documentation:  30 minutes.      Creola Corn Fatin Bachicha, LCSW

## 2018-12-20 ENCOUNTER — Other Ambulatory Visit: Payer: Self-pay

## 2018-12-20 ENCOUNTER — Other Ambulatory Visit: Payer: Medicare Other | Admitting: *Deleted

## 2018-12-20 ENCOUNTER — Ambulatory Visit: Payer: Medicare Other | Admitting: Psychology

## 2018-12-20 ENCOUNTER — Other Ambulatory Visit: Payer: Medicare Other | Admitting: Licensed Clinical Social Worker

## 2018-12-20 DIAGNOSIS — Z515 Encounter for palliative care: Secondary | ICD-10-CM

## 2018-12-23 ENCOUNTER — Encounter: Payer: Self-pay | Admitting: Family Medicine

## 2018-12-23 NOTE — Progress Notes (Signed)
COMMUNITY PALLIATIVE CARE SW NOTE  PATIENT NAME: Tracy Malone DOB: 1931-08-02 MRN: [id number]  PRIMARY CARE PROVIDER: [name], MD  RESPONSIBLE PARTY:  Acct ID - Guarantor Home Phone Work Phone Relationship Acct Type  [id number] [name] [phone] Self P/F     [address] [place], [place] [address]   Due to the COVID-19 crisis, this virtual check-in visit was done via telephone from my office and it was initiated and consent given by thispatient.  PLAN OF CARE and INTERVENTIONS:             1. GOALS OF CARE/ ADVANCE CARE PLANNING:  Patient's goal is remain in her home with her husband, [name], who is also a Palliative Care patient.  She is a full code. 2. SOCIAL/EMOTIONAL/SPIRITUAL ASSESSMENT/ INTERVENTIONS:  SW and Palliative Care RN, [name], conducted a joint virtual check-in meeting with patient in her home.  She stated she continues to recover from her recent hospitalization.  Patient also discussed her need to not be a burden to her husband and daughter.  She has video chats with her son in [place] which she appears to enjoy.  SW provided active listening and supportive counseling. 3. PATIENT/CAREGIVER EDUCATION/ COPING:  Patient expresses her feelings openly. 4. PERSONAL EMERGENCY PLAN:  She will inform her husband and daughter for emergencies. 5. COMMUNITY RESOURCES COORDINATION/ HEALTH CARE NAVIGATION:  Patient has a housekeeper. 6. FINANCIAL/LEGAL CONCERNS/INTERVENTIONS:  None.     SOCIAL HX:  Social History   Tobacco Use  . Smoking status: Never Smoker  . Smokeless tobacco: Never Used  Substance Use Topics  . Alcohol use: No    Alcohol/week: 0.0 standard drinks    CODE STATUS:  Full Code ADVANCED DIRECTIVES: N MOST FORM COMPLETE:  N HOSPICE EDUCATION PROVIDED: N PPS:  Patient stated she has to make herself eat.  She ambulates independently. Duration of visit and documentation:  30 minutes.      Creola Corn Lavaughn Haberle, LCSW

## 2018-12-23 NOTE — Progress Notes (Signed)
COMMUNITY PALLIATIVE CARE RN NOTE  PATIENT NAME: Tracy Malone DOB: 1932-01-10 MRN: [id number]  PRIMARY CARE PROVIDER: [name], MD  RESPONSIBLE PARTY:  Acct ID - Guarantor Home Phone Work Phone Relationship Acct Type  [id number] [name] [phone] Self P/F     [address], [place], [place] [address]   Due to the COVID-19 crisis, this virtual check-in visit was done via telephone from my office and it was initiated and consent by this patient and or family.  PLAN OF CARE and INTERVENTION:  1. ADVANCE CARE PLANNING/GOALS OF CARE: Goal is for patient to remain at home with her husband. She is a Full Code. 2. PATIENT/CAREGIVER EDUCATION: Reinforced Safety Precautions 3. DISEASE STATUS: Joint virtual check-in visit completed via telephone with [place]. Patient denies pain at this time. She spoke about her most recent hospitalization from 4/30-5/4 due to ischemic colitis. She was treated with IV antibiotics and placed on a liquid diet x 2 days. She was seen by her OBGYN yesterday who restricted her diet to include foods that are not too harsh on her digestive system. Less fiber and no fried foods. Recommendation made for her to drink 2 Boosts per day. Her appetite is slowly improving. Continues with good fluid intake. She remains able to perform ADLs independently, however she tires much easier and is resting more  on and off throughout the day. She reports that she had an episode of severe dizziness last pm while lying on couch. Unsure of cause. None experienced today. She knows that her recovery is going to take time, but she says she is trying to be more patient. She is taking Miralax PRN constipation which is helpful. She has an appointment with her Gastroenterologist next month. No tearfulness noted during visit today. She had a very positive disposition. Will continue to monitor.   HISTORY OF PRESENT ILLNESS:  This is a 85 yo female who resides at  home with her husband. Palliative Care Team continues to follow patient. Will continue to check in with patient monthly and PRN.  CODE STATUS: Full Code   ADVANCED DIRECTIVES: Y MOST FORM: no PPS: 50%   (Duration of visit and documentation 30 minutes)   [name], RN BSN

## 2019-01-02 DIAGNOSIS — L820 Inflamed seborrheic keratosis: Secondary | ICD-10-CM | POA: Diagnosis not present

## 2019-01-02 DIAGNOSIS — L821 Other seborrheic keratosis: Secondary | ICD-10-CM | POA: Diagnosis not present

## 2019-01-03 DIAGNOSIS — F5101 Primary insomnia: Secondary | ICD-10-CM | POA: Diagnosis not present

## 2019-01-03 DIAGNOSIS — F410 Panic disorder [episodic paroxysmal anxiety] without agoraphobia: Secondary | ICD-10-CM | POA: Diagnosis not present

## 2019-01-03 DIAGNOSIS — F332 Major depressive disorder, recurrent severe without psychotic features: Secondary | ICD-10-CM | POA: Diagnosis not present

## 2019-01-07 DIAGNOSIS — K529 Noninfective gastroenteritis and colitis, unspecified: Secondary | ICD-10-CM | POA: Diagnosis not present

## 2019-01-09 ENCOUNTER — Telehealth: Payer: Self-pay | Admitting: General Surgery

## 2019-01-09 NOTE — Telephone Encounter (Signed)
Covid-19 screening questions   Do you now or have you had a fever in the last 14 days? no  Do you have any respiratory symptoms of shortness of breath or cough now or in the last 14 days? no  Do you have any family members or close contacts with diagnosed or suspected Covid-19 in the past 14 days? no  Have you been tested for Covid-19 and found to be positive? NO  Patient instructed to wear a mask to office and she stated that she was told that her husband was able to come as well. Expressed that he will need to mask as well. The patient verbalized understanding.

## 2019-01-10 ENCOUNTER — Ambulatory Visit: Payer: Medicare Other | Admitting: Gastroenterology

## 2019-01-13 ENCOUNTER — Telehealth: Payer: Self-pay | Admitting: *Deleted

## 2019-01-13 NOTE — Telephone Encounter (Signed)
Contacted and spoke with patient to arrange a virtual check in visit. Patient is very tearful/emotional today during phone conversation and states that she feels that "everything is falling apart." She wants visit scheduled for 01/17/19 at 1pm.

## 2019-01-15 ENCOUNTER — Encounter: Payer: Self-pay | Admitting: Family Medicine

## 2019-01-17 ENCOUNTER — Other Ambulatory Visit: Payer: Self-pay

## 2019-01-17 ENCOUNTER — Other Ambulatory Visit: Payer: Medicare Other | Admitting: Licensed Clinical Social Worker

## 2019-01-17 ENCOUNTER — Other Ambulatory Visit: Payer: Medicare Other | Admitting: *Deleted

## 2019-01-17 DIAGNOSIS — Z515 Encounter for palliative care: Secondary | ICD-10-CM | POA: Diagnosis not present

## 2019-01-17 NOTE — Progress Notes (Signed)
COMMUNITY PALLIATIVE CARE SW NOTE  PATIENT NAME: Tracy Malone DOB: 1931-11-29 MRN: [id number]  PRIMARY CARE PROVIDER: [name], MD  RESPONSIBLE PARTY:  Acct ID - Guarantor Home Phone Work Phone Relationship Acct Type  [id number] [name] Self P/F     [address] [place], [place] [address]   Due to the COVID-19 crisis, this virtual check-in visit was done via telephone from my office and it was initiated and consent given by this patient and or family.  PLAN OF CARE and INTERVENTIONS:             1. GOALS OF CARE/ ADVANCE CARE PLANNING:  Goal is for patient to remain at home with her husband, [name], who is also a Palliative Care patient.  Patient remains a full code. 2. SOCIAL/EMOTIONAL/SPIRITUAL ASSESSMENT/ INTERVENTIONS:  SW and Palliative Care RN, [name], conducted a virtual check-in visit with patient in her home.  SW provided active listening and supportive counseling while patient discussed her son.  She sounded tearful and stated she is very worried about hi.  Her faith is very important.  She continues to talk with her therapist.  She expressed feeling overwhelmed. 3. PATIENT/CAREGIVER EDUCATION/ COPING:  Patient copes by expressing her feelings openly. 4. PERSONAL EMERGENCY PLAN:  Patient will inform her husband for medical issues. 5. COMMUNITY RESOURCES COORDINATION/ HEALTH CARE NAVIGATION:  Patient has a housekeeper. 6. FINANCIAL/LEGAL CONCERNS/INTERVENTIONS:  None.     SOCIAL HX:  Social History   Tobacco Use  . Smoking status: Never Smoker  . Smokeless tobacco: Never Used  Substance Use Topics  . Alcohol use: No    Alcohol/week: 0.0 standard drinks    CODE STATUS:  Full code  ADVANCED DIRECTIVES: N MOST FORM COMPLETE:  N HOSPICE EDUCATION PROVIDED:  N PPS:  Patient's appetite has decreased.  She can ambulate independently. Duration of visit and documentation:  60 minutes.      Creola Corn Onur Mori, LCSW

## 2019-01-20 NOTE — Progress Notes (Signed)
COMMUNITY PALLIATIVE CARE RN NOTE  PATIENT NAME: Tracy Malone DOB: 1931/10/18 MRN: [id number]  PRIMARY CARE PROVIDER: [name], MD  RESPONSIBLE PARTY:  Acct ID - Guarantor Home Phone Work Phone Relationship Acct Type  [id number] [name] [phone] Self P/F     [address] [place], [place] [address]   Due to the COVID-19 crisis, this virtual check-in visit was done via telephone from my office and it was initiated and consent by this patient and or family.  PLAN OF CARE and INTERVENTION:  1. ADVANCE CARE PLANNING/GOALS OF CARE: Goal is for patient to remain at home with her husband. She is a Full Code. 2. PATIENT/CAREGIVER EDUCATION: Relaxation and Coping Techniques, Bowel Regimen 3. DISEASE STATUS: Joint virtual check-in visit completed with Palliative Care SW, Lynn Duffy. Patient is very tearful and emotional this visit. Patient's son has been having some personal issues that has affected her mood tremendously. She says that she has many problems and feels overwhelmed with her anxiety, depression, Parkinson's and colon issues. She denies wanting to harm herself nor having any suicidal ideations. She spoke with her Counselor last week which is helpful. She has been limiting her exposure to the news and newspapers, as these things contribute to her depression and anxiety. She says that her Xanax was recently increased to 1mg , but she feels that this dose makes her too drowsy during the day and that her legs feel like "lead." However, it is helping her to sleep better during the night. She also reports that she is noticing several bruises to her hands. As bruises on one hand heals, they will occur on her other hand and alternates like this back and forth. Unsure of cause, but she feels that it is due to her Carbidopa/Levodopa. She is going to speak to her Neurologist regarding this during her next visit. She has a upcoming visit next week with her Dr. Ardis Hughs (GI) and Dr.  Helane Rima (OBGYN). Her appetite has decreased some, however she is supplementing with Boost daily. She has been able to maintain her weight. She continues with some issues with constipation vs diarrhea. She is learning how to adjust her medications better. She states that she had been taking a full dose of Miralax, but has since reduced this to half which is better. She is also taking Colace. She remains able to perform ADLs independently, but is taking more frequent rest periods. Will continue to monitor.    HISTORY OF PRESENT ILLNESS: This is a 85 yo female who resides at home with her husband. Palliative Care Team continues to follow patient. Patient requests next visit in 2 weeks.    CODE STATUS: Full Code ADVANCED DIRECTIVES: Y (Living Will/HCPOA) MOST FORM: no PPS: 50%   (Duration of visit and documentation 60 minutes)   [name], RN BSN

## 2019-01-22 DIAGNOSIS — F5101 Primary insomnia: Secondary | ICD-10-CM | POA: Diagnosis not present

## 2019-01-22 DIAGNOSIS — F410 Panic disorder [episodic paroxysmal anxiety] without agoraphobia: Secondary | ICD-10-CM | POA: Diagnosis not present

## 2019-01-22 DIAGNOSIS — F332 Major depressive disorder, recurrent severe without psychotic features: Secondary | ICD-10-CM | POA: Diagnosis not present

## 2019-01-27 ENCOUNTER — Telehealth: Payer: Self-pay | Admitting: Neurology

## 2019-01-27 NOTE — Telephone Encounter (Signed)
Pt is wanting to switch to Dr. Rexene Alberts frm [name] she has parkinson and we have rcv'd a referral is it ok for pt to switch to Dr. Rexene Alberts?

## 2019-01-27 NOTE — Telephone Encounter (Signed)
I have reviewed her chart, I really do not have anything new or different to offer her. I will therefore have to decline the transfer. Of note, she has also been followed by Dr. Carles Collet as well with a recent appointment and next appointment pending.

## 2019-01-27 NOTE — Telephone Encounter (Signed)
She transferred her care to Dr. Carles Collet and I have not seen her for over a year.  As I do not specialize in movement disorders, she should continue to see Dr. Carles Collet or ask her PCP for a referral to another movement disorder specialist

## 2019-02-04 ENCOUNTER — Telehealth: Payer: Self-pay | Admitting: Licensed Clinical Social Worker

## 2019-02-04 ENCOUNTER — Telehealth: Payer: Self-pay

## 2019-02-04 NOTE — Telephone Encounter (Signed)
Palliative Care SW scheduled a virtual check-in visit with patient and her husband, Gwyndolyn Saxon, for Friday, 7/17, at 1pm.

## 2019-02-04 NOTE — Telephone Encounter (Signed)
Covid-19 screening questions   Do you now or have you had a fever in the last 14 days no   Do you have any respiratory symptoms of shortness of breath or cough now or in the last 14 days no  Do you have any family members or close contacts with diagnosed or suspected Covid-19 in the past 14 days no  Have you been tested for Covid-19 and found to be positive no          

## 2019-02-05 ENCOUNTER — Encounter

## 2019-02-05 ENCOUNTER — Ambulatory Visit (INDEPENDENT_AMBULATORY_CARE_PROVIDER_SITE_OTHER): Payer: Medicare Other | Admitting: Gastroenterology

## 2019-02-05 ENCOUNTER — Encounter: Payer: Self-pay | Admitting: Gastroenterology

## 2019-02-05 VITALS — BP 110/64 | HR 91 | Temp 97.9°F | Ht 59.0 in | Wt 106.0 lb

## 2019-02-05 DIAGNOSIS — I6521 Occlusion and stenosis of right carotid artery: Secondary | ICD-10-CM | POA: Diagnosis not present

## 2019-02-05 DIAGNOSIS — K559 Vascular disorder of intestine, unspecified: Secondary | ICD-10-CM

## 2019-02-05 DIAGNOSIS — M25552 Pain in left hip: Secondary | ICD-10-CM | POA: Diagnosis not present

## 2019-02-05 DIAGNOSIS — K589 Irritable bowel syndrome without diarrhea: Secondary | ICD-10-CM | POA: Diagnosis not present

## 2019-02-05 DIAGNOSIS — M25551 Pain in right hip: Secondary | ICD-10-CM | POA: Diagnosis not present

## 2019-02-05 MED ORDER — DICYCLOMINE HCL 10 MG PO CAPS: 10.0000 mg | ORAL_CAPSULE | Freq: Four times a day (QID) | ORAL | 3 refills | Status: DC | PRN

## 2019-02-05 NOTE — Patient Instructions (Addendum)
Please weigh yourself every other day  Please purchase the following medications over the counter and take as directed: Multivitamin, continue your probiotic  We have sent the following medications to your pharmacy for you to pick up at your convenience: Dicyclomine  Please take a daily walk in or outdoors   Please contact our office for a follow up appointment for  September  Thank you for entrusting me with your care and choosing [facility].  Dr Ardis Hughs

## 2019-02-05 NOTE — Progress Notes (Signed)
Review of previous GI testing; Dr. Benson Norway and changed to Dr. Ardis Hughs 10/2014:  -CBC, CMP, and TSH were normal in 07/2014 and again in 08/2014. CT scan02/26/2016.This was done without contrast due to dye allergy.This showed no acute findings in the abdomen and pelvis specifically to explain her history of abdominal pain. There is a 3.0 cm left ovarian cystic lesion that is likely benign. As I stated above, she is following with gynecology and they're going to monitor that, but they think that it is benign.Stomach, duodenum, small bowel, and colon grossly unremarkable.Abdominal ultrasound on 02/12/2016showed no acute or chronic abnormalities of the hepatobiliary tree; evaluation of the pancreatic tail was limited. There is no acute abnormalities of the kidney demonstrated. A 1.6 cm diameter mid to lower pole cyst was noted in the right kidney. August 2015 HIDA scan with [facility], which was normal.This was performed for complaints of upper abdominal pain, appetite loss, fatigue, and weight loss at that time as well.EGD was in December 2013at which time she was found to have moderate gastritis. Biopsies actually showed H. Pylori with chronic active gastritis, however, after taking 1 dose of the Pylera medication she discontinued it because of side effects (it made her feel horrible).Therefore, she has not completed treatment for H. Pylori. Duodenal biopsies were unremarkable at that time.flexible sigmoidoscopy also in December 2013that was normal with only medium hemorrhoids noted. This was performed for diarrhea and biopsies of the descending and sigmoid colon were unremarkable. She states that she had a full colonoscopy prior to that with Dr. Benson Norway, however, after calling his office they stated they have never performed a full colonoscopy on her. Gastric emptying scan in January 2014showed 33% of the ingested activity remaining in the stomach which is mildly abnormal compared to the normal range of less than 30%  of the ingested activity remaining at 120 minutes.Repeat GES 3/2016was normal. EGD 11/2014 with Dr. Ardis Hughs showed pan-gastritis withpositive Hpylori on biopsies; also small hiatal hernia. Saw ID and was apparently treated with subsequent testing negative. Repeat abdominal ultrasound 09/2015 was normal.07/2016 H. Pylori stool antigen was negative.Repeat EGD 08/2016: mild gastritis, + for H pylori again. Referred to ID and tried a regimen but could not complete it due  2.  Presumed ischemic colitis.  April 2020, CT scan suggested "mild circumferential wall thickening about the transverse colon, which may be related to incomplete distention or possibly mild colitis".    HPI: This is a pleasant 85 year old woman who I last visited with via telemedicine about 2 months ago.  In the interim she no showed for an in person appointment.  Chief complaint is lower abdominal cramping  She is here with her husband today.  She has a lot of fatigue still.  She has intermittent mild lower abdominal cramping.  Antispasmodic Bentyl seems to help.  She is out of prescription refills for it.  She asked about Colace 3 times daily, probiotic dosing, multivitamins.  We had a very nice discussion  ROS: complete GI ROS as described in HPI, all other review negative.  Constitutional:  No unintentional weight loss   Past Medical History:  Diagnosis Date  . Bell's palsy 1966   ? right side facial droop  . Carotid artery disease ([place]) 2010   on vascular screening;unchanged 2013.(could not tolerate simvastatin, no other statins tried)--<30% blockage bilat 07/2011  . Chronic abdominal pain   . Chronic fatigue and malaise   . Claustrophobia   . Depression    treated in the past for years;stopped  in 2010 for a years  . Duodenal ulcer 1962   h/o  . Dysrhythmia    ocassional PVC's  . Fibromyalgia   . 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 GERD (gastroesophageal reflux  disease)   . Glaucoma, narrow-angle    s/p laser surgery  . History of hiatal hernia    during endoscopy  . Hypothyroid 9/08  . IBS (irritable bowel syndrome)    Dr. Benson Norway  . Ischemic colitis ([place]) 11/21/2018  . Ocular migraine   . Osteoporosis 10/11   Dr.Hawkes  . Panic attack   . Recurrent UTI    has cystocele-Dr.Grewal  . Shingles 1999   h/o  . Superficial thrombophlebitis 03/2009   RLE  . Trochanteric bursitis 12/2008   bilateral    Past Surgical History:  Procedure Laterality Date  . ABDOMINAL HYSTERECTOMY    . CATARACT EXTRACTION, BILATERAL  1995, 1996  . Flexible sigmoidoscopy    . THYROIDECTOMY, PARTIAL  09/2005   L nodule; Dr. Harlow Asa  . TONSILLECTOMY  1946  . UPPER GI ENDOSCOPY  06/27/12  . VAGINAL HYSTERECTOMY  1971   and bladder repair.  Still has ovaries    Current Outpatient Medications  Medication Sig Dispense Refill  . ALPRAZolam (XANAX) 0.5 MG tablet TAKE BY MOUTH UP TO 3 TIMES DAILY. WARNING: BENZODIAZEPINES INCREASE THE RISK OF FALLS AND NEUROCOGNITIVE DECLINE IN THE ELDERLY (Patient taking differently: Take 0.5 mg by mouth 4 (four) times daily as needed for anxiety. ) 90 tablet 2  . Carbidopa-Levodopa ER (SINEMET CR) 25-100 MG tablet controlled release Take 1 tablet by mouth 4 (four) times daily. (Patient taking differently: Take 1 tablet by mouth 3 (three) times daily. ) 360 tablet 1  . Cholecalciferol (VITAMIN D) 2000 UNITS tablet Take 2,000 Units by mouth daily.    Marland Kitchen dicyclomine (BENTYL) 10 MG capsule Take 1 capsule (10 mg total) by mouth every 6 (six) hours as needed (abdominal discomfort). 30 capsule 0  . ondansetron (ZOFRAN-ODT) 4 MG disintegrating tablet Take 1 tablet (4 mg total) by mouth every 8 (eight) hours as needed for nausea or vomiting. 20 tablet 0  . Probiotic Product (ALIGN PO) Take 1 capsule by mouth daily.     Marland Kitchen SYNTHROID 25 MCG tablet TAKE 1 TABLET DAILY BEFORE BREAKFAST (Patient taking differently: Take 25 mcg by mouth daily before  breakfast. ) 90 tablet 1   No current facility-administered medications for this visit.     Allergies as of 02/05/2019 - Review Complete 02/05/2019  Allergen Reaction Noted  . Iodine Anaphylaxis 05/13/2012  . Levsin [hyoscyamine sulfate]  10/13/2015  . Salmon [fish allergy] Hives and Shortness Of Breath 05/13/2012  . Shellfish allergy Anaphylaxis 05/13/2012  . Remeron [mirtazapine] Other (See Comments) 08/25/2016  . Aspirin Other (See Comments) 05/13/2012  . Ciprofloxacin Diarrhea 05/13/2012  . Codeine Nausea And Vomiting 05/13/2012  . Darvocet [propoxyphene n-acetaminophen] Nausea And Vomiting 05/13/2012  . Demerol [meperidine] Nausea Only 05/13/2012  . Dexilant [dexlansoprazole] Swelling 03/01/2016  . Diphedryl [diphenhydramine] Other (See Comments) 05/13/2012  . Doxycycline hyclate Other (See Comments) 05/13/2012  . Epinephrine Other (See Comments) 05/13/2012  . Erythromycin Other (See Comments) 05/13/2012  . Flexeril [cyclobenzaprine] Other (See Comments) 05/13/2012  . Keflex [cephalexin] Hives 05/13/2012  . Latex Other (See Comments) 05/13/2012  . Nitrofurantoin Diarrhea 11/22/2018  . Prednisone Other (See Comments) 05/13/2012  . Pylera [bis subcit-metronid-tetracyc] Swelling 08/01/2012  . Sulfa antibiotics Other (See Comments) 05/13/2012  .  Xylocaine [lidocaine hcl]  05/13/2012  . Zoloft [sertraline hcl] Swelling and Other (See Comments) 05/13/2012  . Advil [ibuprofen] Other (See Comments) 05/13/2012  . Clarithromycin Rash 01/14/2015    Family History  Problem Relation Age of Onset  . Heart disease Mother   . Hypertension Mother   . Hypertension Sister   . HIV Son   . Heart disease Brother   . Lung cancer Brother        lung  . Diabetes Maternal Grandfather   . Diabetes Granddaughter        type 1    Social History   Socioeconomic History  . Marital status: Married    Spouse name: Not on file  . Number of children: 2  . Years of education: Not on file  .  Highest education level: Not on file  Occupational History  . Occupation: retired (school system)  Social Needs  . Financial resource strain: Not on file  . Food insecurity    Worry: Not on file    Inability: Not on file  . Transportation needs    Medical: Not on file    Non-medical: Not on file  Tobacco Use  . Smoking status: Never Smoker  . Smokeless tobacco: Never Used  Substance and Sexual Activity  . Alcohol use: No    Alcohol/week: 0.0 standard drinks  . Drug use: No  . Sexual activity: Not Currently    Partners: Male  Lifestyle  . Physical activity    Days per week: Not on file    Minutes per session: Not on file  . Stress: Not on file  Relationships  . Social Herbalist on phone: Not on file    Gets together: Not on file    Attends religious service: Not on file    Active member of club or organization: Not on file    Attends meetings of clubs or organizations: Not on file    Relationship status: Not on file  . Intimate partner violence    Fear of current or ex partner: Not on file    Emotionally abused: Not on file    Physically abused: Not on file    Forced sexual activity: Not on file  Other Topics Concern  . Not on file  Social History Narrative   Married.  Son lives in [place]; Daughter [name] lives in [place]; 2 grandchildren     Physical Exam: BP 110/64   Pulse 91   Temp 97.9 F (36.6 C)   Ht 4\' 11"  (1.499 m)   Wt 106 lb (48.1 kg)   BMI 21.41 kg/m  Constitutional: generally well-appearing Psychiatric: alert and oriented x3 Abdomen: soft, nontender, nondistended, no obvious ascites, no peritoneal signs, normal bowel sounds No peripheral edema noted in lower extremities  Assessment and plan: 85 y.o. female with intermittent constipation, previous ischemic colitis  I think her ischemic colitis is clearly healed clinically.  We are left with mild IBS-like symptoms.  She is 64, quite frail, fatigue is a big issue for her.  I recommended  that she try to get out of the house and walk once or twice a day.  I explained to her that it was safe for her to stay on Colace 2 or 3 times a day like she has been doing since it helps to regulate her bowels better than MiraLAX.  It is also safe that she stay on probiotics daily.  I recommended a multivitamin, really no downside and usually  and she thinks it might help her fatigue.  It might.  We will give her a new prescription for Bentyl 10 mg, she is out of this now and it does seem to help when she does have lower abdominal cramping.  She will return to see me in 2 months and sooner if any problems.  Please see the "Patient Instructions" section for addition details about the plan.  Owens Loffler, MD [facility] 02/05/2019, 9:56 AM

## 2019-02-06 ENCOUNTER — Ambulatory Visit (INDEPENDENT_AMBULATORY_CARE_PROVIDER_SITE_OTHER): Payer: Medicare Other | Admitting: Neurology

## 2019-02-06 ENCOUNTER — Other Ambulatory Visit: Payer: Self-pay

## 2019-02-06 ENCOUNTER — Encounter: Payer: Self-pay | Admitting: Neurology

## 2019-02-06 VITALS — BP 105/60 | HR 80 | Temp 97.5°F | Ht 59.0 in | Wt 107.0 lb

## 2019-02-06 DIAGNOSIS — F418 Other specified anxiety disorders: Secondary | ICD-10-CM

## 2019-02-06 DIAGNOSIS — G250 Essential tremor: Secondary | ICD-10-CM | POA: Diagnosis not present

## 2019-02-06 DIAGNOSIS — I6521 Occlusion and stenosis of right carotid artery: Secondary | ICD-10-CM

## 2019-02-06 DIAGNOSIS — R269 Unspecified abnormalities of gait and mobility: Secondary | ICD-10-CM

## 2019-02-06 DIAGNOSIS — G20 Parkinson's disease: Secondary | ICD-10-CM

## 2019-02-06 DIAGNOSIS — R0789 Other chest pain: Secondary | ICD-10-CM | POA: Diagnosis not present

## 2019-02-06 DIAGNOSIS — G20A1 Parkinson's disease without dyskinesia, without mention of fluctuations: Secondary | ICD-10-CM

## 2019-02-06 MED ORDER — METOPROLOL TARTRATE 25 MG PO TABS: ORAL_TABLET | ORAL | 11 refills | Status: DC

## 2019-02-06 NOTE — Progress Notes (Signed)
[facility]  PATIENT: Tracy Malone DOB: [birth year]  REFERRING DOCTOR OR PCP:  [name] SOURCE: patient and EMR records  _________________________________   HISTORICAL  CHIEF COMPLAINT:  Chief Complaint  Patient presents with  . Follow-up    RM 12 with husband.Husband temp: 98.0. Last seen 11/28/2017. Here to f/u on tremor, gait issues, anxiety. Takes sinemet. Takes xanax, wellbutrin (followed by psychiatry). Having more diffficulty with walking. Was in the hospital for ischemic collitis back in June.     HISTORY OF PRESENT ILLNESS:  Tracy Malone is an 85 year old woman with tremor and shoulder pain  Update 02/06/2019: She continues to have a severe tremor in her right hand but not in the left hand or head.  She feels gait is doing ok.   No recent falls.  She is on Sinemet 25-100 one po tid and alprazolam 0.5 mg qid.     She does not think Sinemet helped the tremor but gait may be slightly better.     She was hospitalized for a colitis she reports was due to constipation from Sinemet..   She is now also on dicyclomine but had the constipation before that.  She feels the tremor is actually worse 30-90 minutes after each Sinemet and she feels more agitated during that time.    Her tremor had done better on propranolol LA 60 mg but her BP dropped and she felt lightheaded and needed to stop  She has needed bursa injections for hip pain and just had one yesterday.  She is sore and feels she is walking a little worse today.  Alprazolam has helped the anxiety a little bit.   However, she still feels agitated at time during the day.  She reports trying several other medications in the past that did not help the anxiety or were poorly tolerated.  Update 11/28/2017: Her right hand tremor has worsened.  She does not note any significant tremor on the left.  Her tremor will worsen if she is anxious.  She is on Xanax 0.5 mg tid and that helps the tremor some.    She is on  Sinemet CR 25/100.   She can tolerate one pill a day but on 2 pills she had a headache.  On 1 pill, she does not note any benefit.  We discussed that that dose is extremely low and would be unlikely to make a difference.    She stumbled some with her gait but has not had falls.  She is concerned about interactions between Wellbutrin and Sinemet.   We discussed that they can be taken at the same time.     She has shoulder pain on her right.   She only got short term benefit from the subacromial bursa injection.    From 03/16/2017: Tremor:   She has a tremor that is worse in the right arm the left arm. She notes it is generally worse when she is aggravated or if she tries to hold still such as holding a cup or spoon to her mouth. She also notes it with writing. Alprazolam has helped the tremor some she takes a low dose 0.25 mg 2-3 times a day.   She first noted a right hand tremor in late 2015.    She notes the tremor in the right hand much more than the left.      Tremor is not present when asleep.    She has not noted the tremor in the head.  She has not had any difficulty with her gait or balance. She has had a couple falls but they occurred with tripping. There is no family history of tremors.   Shoulder pain:   The right shoulder pain returned. It is worse when she holds her arm over her head or externally rotates.  Pain is best when she holds the arm into her chest. In the past, we did a subacromial bursa injection and that helped her for 2-3 months. She also received an injection by her orthopedic surgeon that also helped for 2-3 months.   NSAIDs have not helped.  Nodules:   She has a nodule in the right arm.   She has shown her PCP  Mood:  She has noted some crying spells but no laughing spells. The crying spells and sometimes spontaneous often she is sad at that time. The history of depression. She prefers not to be on an antidepressant at this time.   REVIEW OF SYSTEMS: Constitutional: No  fevers, chills, sweats, or change in appetite Eyes: No visual changes, double vision, eye pain Ear, nose and throat: No hearing loss, ear pain, nasal congestion, sore throat Cardiovascular: No chest pain, palpitations.  Has had palpitations Respiratory: No shortness of breath at rest or with exertion.   No wheezes GastrointestinaI: No nausea, vomiting, diarrhea, abdominal pain, fecal incontinence Genitourinary: No dysuria, urinary retention or frequency.  No nocturia. Musculoskeletal: No neck pain, back pain.  She notes right shoulder pain Integumentary: No rash, pruritus, skin lesions Neurological: as above Psychiatric: Some crying spells..  Some anxiety Endocrine: No palpitations, diaphoresis, change in appetite, change in weigh or increased thirst Hematologic/Lymphatic: No anemia, purpura, petechiae. Allergic/Immunologic: No itchy/runny eyes, nasal congestion, recent allergic reactions, rashes  ALLERGIES: Allergies  Allergen Reactions  . Iodine Anaphylaxis    IV and topical forms.  Burnard Leigh [Hyoscyamine Sulfate]     Vision problems/pt has glaucoma  . Salmon [Fish Allergy] Hives and Shortness Of Breath  . Shellfish Allergy Anaphylaxis  . Remeron [Mirtazapine] Other (See Comments)    Cause blurred vision and red eyes, pt has glaucoma  . Aspirin Other (See Comments)    Sever stomach pain due to ulcer scaring.  . Ciprofloxacin Diarrhea  . Codeine Nausea And Vomiting  . Darvocet [Propoxyphene N-Acetaminophen] Nausea And Vomiting  . Demerol [Meperidine] Nausea Only  . Dexilant [Dexlansoprazole] Swelling    Redness, swelling and peeling of both feet.  . Diphedryl [Diphenhydramine] Other (See Comments)    Increased pulse/small amount ok  . Doxycycline Hyclate Other (See Comments)    GI intolerance.  Marland Kitchen Epinephrine Other (See Comments)    Breathing problems  . Erythromycin Other (See Comments)    GI intolerance.  Yvette Rack [Cyclobenzaprine] Other (See Comments)     Tingly/prickly sensation.  Marland Kitchen Keflex [Cephalexin] Hives  . Latex Other (See Comments)    Gloves ok.  Skin gets red from elastic in underwear and latex bandaides.  . Nitrofurantoin Diarrhea  . Prednisone Other (See Comments)    Headache  . Pylera [Bis Subcit-Metronid-Tetracyc] Swelling    Tongue swelling. Face tingling  . Sulfa Antibiotics Other (See Comments)    Increased pulse, fainting, diarrhea, thrush  . Xylocaine [Lidocaine Hcl]     With epinephrine, given by dentist.  Speeded up heart rate and she passed out (occured twice, at dentist)  . Zoloft [Sertraline Hcl] Swelling and Other (See Comments)    Migraine Swelling of tongue/lip (09/2012)  . Advil [Ibuprofen] Other (See Comments)  Motrin ok with a GI effect.  . Clarithromycin Rash    Started after completing 10 day course of 2000 mg /day    HOME MEDICATIONS:  Current Outpatient Medications:  .  ALPRAZolam (XANAX) 0.5 MG tablet, TAKE BY MOUTH UP TO 3 TIMES DAILY. WARNING: BENZODIAZEPINES INCREASE THE RISK OF FALLS AND NEUROCOGNITIVE DECLINE IN THE ELDERLY (Patient taking differently: Take 0.5 mg by mouth 4 (four) times daily as needed for anxiety. ), Disp: 90 tablet, Rfl: 2 .  Carbidopa-Levodopa ER (SINEMET CR) 25-100 MG tablet controlled release, Take 1 tablet by mouth 4 (four) times daily. (Patient taking differently: Take 1 tablet by mouth 3 (three) times daily. ), Disp: 360 tablet, Rfl: 1 .  Cholecalciferol (VITAMIN D) 2000 UNITS tablet, Take 2,000 Units by mouth daily., Disp: , Rfl:  .  dicyclomine (BENTYL) 10 MG capsule, Take 1 capsule (10 mg total) by mouth every 6 (six) hours as needed (abdominal discomfort)., Disp: 30 capsule, Rfl: 0 .  dicyclomine (BENTYL) 10 MG capsule, Take 1 capsule (10 mg total) by mouth every 6 (six) hours as needed for spasms., Disp: 50 capsule, Rfl: 3 .  Probiotic Product (ALIGN PO), Take 1 capsule by mouth daily. , Disp: , Rfl:  .  SYNTHROID 25 MCG tablet, TAKE 1 TABLET DAILY BEFORE BREAKFAST  (Patient taking differently: Take 25 mcg by mouth daily before breakfast. ), Disp: 90 tablet, Rfl: 1  PAST MEDICAL HISTORY: Past Medical History:  Diagnosis Date  . Bell's palsy 1966   ? right side facial droop  . Carotid artery disease ([place]) 2010   on vascular screening;unchanged 2013.(could not tolerate simvastatin, no other statins tried)--<30% blockage bilat 07/2011  . Chronic abdominal pain   . Chronic fatigue and malaise   . Claustrophobia   . Depression    treated in the past for years;stopped in 2010 for a years  . Duodenal ulcer 1962   h/o  . Dysrhythmia    ocassional PVC's  . Fibromyalgia   . Frequent PVCs 07/2012   Seen by [facility]: benign, asymptomatic, normal EF  . GERD (gastroesophageal reflux disease)   . Glaucoma, narrow-angle    s/p laser surgery  . History of hiatal hernia    during endoscopy  . Hypothyroid 9/08  . IBS (irritable bowel syndrome)    Dr. Benson Norway  . Ischemic colitis ([place]) 11/21/2018  . Ocular migraine   . Osteoporosis 10/11   Dr.Hawkes  . Panic attack   . Recurrent UTI    has cystocele-Dr.Grewal  . Shingles 1999   h/o  . Superficial thrombophlebitis 03/2009   RLE  . Trochanteric bursitis 12/2008   bilateral    PAST SURGICAL HISTORY: Past Surgical History:  Procedure Laterality Date  . ABDOMINAL HYSTERECTOMY    . CATARACT EXTRACTION, BILATERAL  1995, 1996  . Flexible sigmoidoscopy    . THYROIDECTOMY, PARTIAL  09/2005   L nodule; Dr. Harlow Asa  . TONSILLECTOMY  1946  . UPPER GI ENDOSCOPY  06/27/12  . VAGINAL HYSTERECTOMY  1971   and bladder repair.  Still has ovaries    FAMILY HISTORY: Family History  Problem Relation Age of Onset  . Heart disease Mother   . Hypertension Mother   . Hypertension Sister   . HIV Son   . Heart disease Brother   . Lung cancer Brother        lung  . Diabetes Maternal Grandfather   . Diabetes Granddaughter        type 1  SOCIAL HISTORY:  Social History   Socioeconomic History  . Marital  status: Married    Spouse name: Gwyndolyn Saxon  . Number of children: 2  . Years of education: Not on file  . Highest education level: Not on file  Occupational History  . Occupation: retired (school system)  Social Needs  . Financial resource strain: Not on file  . Food insecurity    Worry: Not on file    Inability: Not on file  . Transportation needs    Medical: Not on file    Non-medical: Not on file  Tobacco Use  . Smoking status: Never Smoker  . Smokeless tobacco: Never Used  Substance and Sexual Activity  . Alcohol use: No    Alcohol/week: 0.0 standard drinks  . Drug use: No  . Sexual activity: Not Currently    Partners: Male  Lifestyle  . Physical activity    Days per week: Not on file    Minutes per session: Not on file  . Stress: Not on file  Relationships  . Social Herbalist on phone: Not on file    Gets together: Not on file    Attends religious service: Not on file    Active member of club or organization: Not on file    Attends meetings of clubs or organizations: Not on file    Relationship status: Not on file  . Intimate partner violence    Fear of current or ex partner: Not on file    Emotionally abused: Not on file    Physically abused: Not on file    Forced sexual activity: Not on file  Other Topics Concern  . Not on file  Social History Narrative   Married.  Son lives in [place]; Daughter [name] lives in [place]; 2 grandchildren     PHYSICAL EXAM  Vitals:   02/06/19 0949  BP: 105/60  Pulse: 80  Temp: (!) 97.5 F (36.4 C)  SpO2: 98%  Weight: 107 lb (48.5 kg)  Height: 4\' 11"  (1.499 m)    Body mass index is 21.61 kg/m.   General: The patient is well-developed and well-nourished and in no acute distress  Musculoskeletal:  There is tenderness over the right subacromial bursa and she has a reduced range of motion of the right shoulder, limited by pain.  Skin:   No edema.    There were a couple small subcutaneous nodules in the right  arm. .    Neurologic Exam  Mental status: The patient is alert and oriented x 3 at the time of the examination. The patient has apparent normal recent and remote memory, with an apparently normal attention span and concentration ability.   Speech is normal.  Cranial nerves: Extraocular movements are full.   Facial strength is normal.  Trapezius and sternocleidomastoid strength is normal. No dysarthria is noted.    Motor: She does not have bradykinesia.  There is a 6 Hz tremor on the right that is worse during rest and with intention.   Muscle bulk is normal.   Tone is normal. No cogwheeling.  Strength is  5 / 5 in all 4 extremities.   Sensory: Sensory testing is intact to touch and vibration sensation in all 4 extremities.  Coordination: Cerebellar testing reveals good finger-nose-finger bilaterally.  Gait and station: Station is normal.   The gait is fairly normal for age.  Her tandem gait is minimally wide but normal for age.  Today, he did not have any  significant retropulsion..  Romberg is negative.   Reflexes: Deep tendon reflexes are symmetric and normal bilaterally.    DIAGNOSTIC DATA (LABS, IMAGING, TESTING) - I reviewed patient records, labs, notes, testing and imaging myself where available.  Lab Results  Component Value Date   WBC 12.4 (H) 12/02/2018   HGB 14.5 12/02/2018   HCT 42.6 12/02/2018   MCV 89 12/02/2018   PLT 411 12/02/2018      Component Value Date/Time   NA 139 12/02/2018 1454   K 4.6 12/02/2018 1454   CL 102 12/02/2018 1454   CO2 24 12/02/2018 1454   GLUCOSE 101 (H) 12/02/2018 1454   GLUCOSE 99 11/25/2018 0505   BUN 11 12/02/2018 1454   CREATININE 0.62 12/02/2018 1454   CREATININE 0.75 10/22/2018 1401   CREATININE 0.67 11/23/2016 0815   CALCIUM 9.5 12/02/2018 1454   PROT 6.8 12/02/2018 1454   ALBUMIN 4.2 12/02/2018 1454   AST 14 12/02/2018 1454   AST 17 10/22/2018 1401   ALT 9 12/02/2018 1454   ALT 7 10/22/2018 1401   ALKPHOS 76 12/02/2018  1454   BILITOT 0.4 12/02/2018 1454   BILITOT 0.8 10/22/2018 1401   GFRNONAA 82 12/02/2018 1454   GFRNONAA >60 10/22/2018 1401   GFRAA 95 12/02/2018 1454   GFRAA >60 10/22/2018 1401   Lab Results  Component Value Date   CHOL 233 (H) 08/29/2018   HDL 91 08/29/2018   LDLCALC 116 (H) 08/29/2018   TRIG 128 08/29/2018   CHOLHDL 2.6 08/29/2018   Lab Results  Component Value Date   HGBA1C 5.5 01/23/2018   No results found for: VITAMINB12 Lab Results  Component Value Date   TSH 1.650 08/29/2018       ASSESSMENT AND PLAN    1. Parkinson's disease ([place])   2. Essential tremor   3. Gait disturbance   4. Depression with anxiety     1.    She appears to have PD/BET overlap.   She did get some beenfit from propranolol but felt lightheadedness and gait seems better with Sinemet.Marland Kitchen   She will cut Sinemet down to 2 pills a day (1/2 pills x 4) and add very low dose metoprolol (12.5 to 25 mg).  If she gets lightheaded she will stop the metoprolol. 2.    Dr. Sharyon Medicus has her on Xanax and Wellbutrin.  She should follow-up with psychiatry for her anxiety.  I asked her to ask Dr. [name] if she can switch to a longer acting benzo (ativan, valium or clonazepam) to see if better controlled. 3.    She will return to see me in 6 months or as needed if there are new or worsening neurologic symptoms.    A. Felecia Shelling, MD, PhD 6/44/0347, 42:59 AM Certified in Neurology, Clinical Neurophysiology, Sleep Medicine, Pain Medicine and Neuroimaging  [facility] [address], [place], [place] [address] [phone]

## 2019-02-07 ENCOUNTER — Other Ambulatory Visit: Payer: Medicare Other | Admitting: Licensed Clinical Social Worker

## 2019-02-07 ENCOUNTER — Other Ambulatory Visit: Payer: Medicare Other | Admitting: *Deleted

## 2019-02-07 ENCOUNTER — Other Ambulatory Visit: Payer: Self-pay

## 2019-02-07 DIAGNOSIS — Z515 Encounter for palliative care: Secondary | ICD-10-CM

## 2019-02-08 DIAGNOSIS — R5381 Other malaise: Secondary | ICD-10-CM | POA: Diagnosis not present

## 2019-02-08 DIAGNOSIS — R251 Tremor, unspecified: Secondary | ICD-10-CM | POA: Diagnosis not present

## 2019-02-08 NOTE — Progress Notes (Signed)
COMMUNITY PALLIATIVE CARE SW NOTE  PATIENT NAME: Tracy Malone DOB: [birth year] MRN: [id number]  PRIMARY CARE PROVIDER: [name], MD  RESPONSIBLE PARTY:  Acct ID - Guarantor Home Phone Work Phone Relationship Acct Type  [id number] [name] Self P/F     [address] [place], [place] [address]   Due to the COVID-19 crisis, this virtual check-in visit was done via telephone from my office and it was initiated and consent given by this patient and or family.  PLAN OF CARE and INTERVENTIONS:             1. GOALS OF CARE/ ADVANCE CARE PLANNING:  Patient's goal is to remain in her home with her husband, Tracy Malone.  He is also a Palliative Care patient.  She is a full code. 2. SOCIAL/EMOTIONAL/SPIRITUAL ASSESSMENT/ INTERVENTIONS:  SW and Palliative Care RN, [name], conducted a virtual check-in visit with patient in her home.  She was tearful during the visit and expressed deep concern for her son in [place].  She plans on contacting her pastor for spiritual console.  She has a new therapist and a community therapist she speaks with weekly.  She said her hair is falling out. She expresses feeling alone even with her family members present.  SW provided active listening and supportive counseling. 3. PATIENT/CAREGIVER EDUCATION/ COPING:  Patient copes by expressing her feelings openly. 4. PERSONAL EMERGENCY PLAN:  She will inform her husband or MD. 5. COMMUNITY RESOURCES [place]:  She has a Secretary/administrator. 6. FINANCIAL/LEGAL CONCERNS/INTERVENTIONS:  None.     SOCIAL HX:  Social History   Tobacco Use  . Smoking status: Never Smoker  . Smokeless tobacco: Never Used  Substance Use Topics  . Alcohol use: No    Alcohol/week: 0.0 standard drinks    CODE STATUS:  Full Code  ADVANCED DIRECTIVES: N MOST FORM COMPLETE:  N HOSPICE EDUCATION PROVIDED: N PPS:  Her appetite has decreased.  She ambulates independently. Duration of visit and  documentation:  60 minutes.      Creola Corn Deavin Forst, LCSW

## 2019-02-10 ENCOUNTER — Telehealth: Payer: Self-pay | Admitting: Gastroenterology

## 2019-02-10 ENCOUNTER — Encounter: Payer: Self-pay | Admitting: Family Medicine

## 2019-02-10 ENCOUNTER — Other Ambulatory Visit: Payer: Self-pay

## 2019-02-10 NOTE — Telephone Encounter (Signed)
Pt reported that she was at [facility] ED and was prescribed brand Macrobid.  Pt would like to know whether it is OK for her to take and if not any alts.

## 2019-02-10 NOTE — Telephone Encounter (Signed)
The pt wanted to discuss antibiotic that MD at [facility] prescribed her.  I did advise her that she would need to speak with the MD that put her on the abx to discuss any changes.  The pt has been advised of the information and verbalized understanding.

## 2019-02-12 NOTE — Progress Notes (Signed)
COMMUNITY PALLIATIVE CARE RN NOTE  PATIENT NAME: Tracy Malone DOB: Dec 15, 1931 MRN: [id number]  PRIMARY CARE PROVIDER: [name], MD  RESPONSIBLE PARTY: [name] (husband) Acct ID - Guarantor Home Phone Work Phone Relationship Acct Type  [id number] ALYSSANDRA, [phone] Self P/F     [address] [place], [place] [address]   Due to the COVID-19 crisis, this virtual check-in visit was done via telephone from my office and it was initiated and consent by this patient and or family.  PLAN OF CARE and INTERVENTION:  1. ADVANCE CARE PLANNING/GOALS OF CARE: Goal is to remain at home with her husband. She is a Full Code. 2. PATIENT/CAREGIVER EDUCATION: Pain Management, Relaxation Techniques 3. DISEASE STATUS: Joint virtual check-in visit completed with Palliative Care SW, Lynn Duffy. She reports having a bad day today. She has been having some family issues that has caused feelings of increased depression, sadness and tearfulness. She is tearful during visit. She has a Social worker whom she speaks with weekly. She has had several doctor appointments this week. She had to have steroid injections to the bursa joint in both legs this week d/t pain and bilateral leg weakness. Injections were helpful. She has little energy and feels like lying down all the time. She speaks of her worsening R hand tremor which makes it difficult for her to write which is frustrating. She continues on her Carbidopa/Levadopa twice daily and Xanax to help with anxiety. She is taking Colace and Miralax to help with constipation. She does not have much of an appetite, but eats because she know she needs to. Continues with good fluid intake and nutritional supplement drinks twice daily. Provided active listening and support. Will continue to monitor.    HISTORY OF PRESENT ILLNESS:  This is a 85 yo female who resides at home with her husband. Palliative Care team continues to follow patient. Will continue to  check in with patient monthly and PRN.  CODE STATUS: Full Code ADVANCED DIRECTIVES: Y MOST FORM: no PPS: 50%   (Duration of visit and documentation 60 minutes)   [name], RN BSN

## 2019-02-13 ENCOUNTER — Encounter: Payer: Self-pay | Admitting: Family Medicine

## 2019-02-13 ENCOUNTER — Other Ambulatory Visit: Payer: Self-pay

## 2019-02-13 ENCOUNTER — Ambulatory Visit: Payer: Medicare Other | Admitting: Psychiatry

## 2019-02-13 ENCOUNTER — Ambulatory Visit (INDEPENDENT_AMBULATORY_CARE_PROVIDER_SITE_OTHER): Payer: Medicare Other | Admitting: Family Medicine

## 2019-02-13 VITALS — BP 110/70 | HR 73 | Temp 98.0°F | Ht 59.0 in | Wt 106.0 lb

## 2019-02-13 DIAGNOSIS — F332 Major depressive disorder, recurrent severe without psychotic features: Secondary | ICD-10-CM

## 2019-02-13 DIAGNOSIS — Z87440 Personal history of urinary (tract) infections: Secondary | ICD-10-CM | POA: Diagnosis not present

## 2019-02-13 DIAGNOSIS — R1010 Upper abdominal pain, unspecified: Secondary | ICD-10-CM

## 2019-02-13 DIAGNOSIS — K589 Irritable bowel syndrome without diarrhea: Secondary | ICD-10-CM

## 2019-02-13 DIAGNOSIS — I6521 Occlusion and stenosis of right carotid artery: Secondary | ICD-10-CM

## 2019-02-13 LAB — POCT URINALYSIS DIP (PROADVANTAGE DEVICE)
Bilirubin, UA: NEGATIVE
Blood, UA: NEGATIVE
Glucose, UA: NEGATIVE mg/dL
Nitrite, UA: NEGATIVE
Protein Ur, POC: NEGATIVE mg/dL
Specific Gravity, Urine: 1.015
Urobilinogen, Ur: NEGATIVE
pH, UA: 6 (ref 5.0–8.0)

## 2019-02-13 NOTE — Progress Notes (Signed)
Chief Complaint  Patient presents with  . Urinary Frequency    taking Macrobid since Tuesday   . Bloated    She woke up this morning with her stomach feeling swollen below her ribs.  It woke her up, and it is still present. Pain was 7/10 this morning, is currently 3/10. Ate some crackers, no worse. Had BM--no change in pain related to having bowel movement. Has had some heartburn, but not in the last few days.  She belches a lot. She uses Mylanta prn.  Bowels have been regular--denies diarrhea or constipation.  Last BM was this morning. No new foods or change in diet (routine/typical dinner includes chicken, potatoes, brocolli and carrots).  Husband states they are getting bored with this diet.  Tracy Malone says she can't eat roughage.  She had abdominal cramping yesterday, took a bentyl yesterday, which helped. Doesn't have that kind of cramping now.  She is currently taking macrobid for UTI.  She had been seen at [facility].  Symptoms included feeling tired and weak, with urinary frequency. Did not have dysuria or hematuria.  She reports these symptoms have improved.  Tolerating the antibiotic so far without diarrhea (documented sided effect in the past).  She continues to be very depressed, worries all the time about her son and other things.    PMH, PSH, SH reviewed  Outpatient Encounter Medications as of 02/13/2019  Medication Sig  . ALPRAZolam (XANAX) 0.5 MG tablet TAKE BY MOUTH UP TO 3 TIMES DAILY. WARNING: BENZODIAZEPINES INCREASE THE RISK OF FALLS AND NEUROCOGNITIVE DECLINE IN THE ELDERLY (Patient taking differently: Take 0.5 mg by mouth 4 (four) times daily as needed for anxiety. )  . Carbidopa-Levodopa ER (SINEMET CR) 25-100 MG tablet controlled release Take 1 tablet by mouth 4 (four) times daily. (Patient taking differently: Take 1 tablet by mouth 3 (three) times daily. )  . Cholecalciferol (VITAMIN D) 2000 UNITS tablet Take 2,000 Units by mouth daily.  Marland Kitchen dicyclomine (BENTYL) 10 MG capsule  Take 1 capsule (10 mg total) by mouth every 6 (six) hours as needed for spasms.  . metoprolol tartrate (LOPRESSOR) 25 MG tablet 1/2 to 1 po in the morning  . nitrofurantoin, macrocrystal-monohydrate, (MACROBID) 100 MG capsule Take by mouth.  . Probiotic Product (ALIGN PO) Take 1 capsule by mouth daily.   Marland Kitchen SYNTHROID 25 MCG tablet TAKE 1 TABLET DAILY BEFORE BREAKFAST (Patient taking differently: Take 25 mcg by mouth daily before breakfast. )  . [DISCONTINUED] dicyclomine (BENTYL) 10 MG capsule Take 1 capsule (10 mg total) by mouth every 6 (six) hours as needed (abdominal discomfort).   No facility-administered encounter medications on file as of 02/13/2019.    Allergies  Allergen Reactions  . Iodine Anaphylaxis    IV and topical forms.  Burnard Leigh [Hyoscyamine Sulfate]     Vision problems/pt has glaucoma  . Salmon [Fish Allergy] Hives and Shortness Of Breath  . Shellfish Allergy Anaphylaxis  . Remeron [Mirtazapine] Other (See Comments)    Cause blurred vision and red eyes, pt has glaucoma  . Aspirin Other (See Comments)    Sever stomach pain due to ulcer scaring.  . Ciprofloxacin Diarrhea  . Codeine Nausea And Vomiting  . Darvocet [Propoxyphene N-Acetaminophen] Nausea And Vomiting  . Demerol [Meperidine] Nausea Only  . Dexilant [Dexlansoprazole] Swelling    Redness, swelling and peeling of both feet.  . Diphedryl [Diphenhydramine] Other (See Comments)    Increased pulse/small amount ok  . Doxycycline Hyclate Other (See Comments)    GI  intolerance.  Marland Kitchen Epinephrine Other (See Comments)    Breathing problems  . Erythromycin Other (See Comments)    GI intolerance.  Yvette Rack [Cyclobenzaprine] Other (See Comments)    Tingly/prickly sensation.  Marland Kitchen Keflex [Cephalexin] Hives  . Latex Other (See Comments)    Gloves ok.  Skin gets red from elastic in underwear and latex bandaides.  . Nitrofurantoin Diarrhea  . Prednisone Other (See Comments)    Headache  . Pylera [Bis  Subcit-Metronid-Tetracyc] Swelling    Tongue swelling. Face tingling  . Sulfa Antibiotics Other (See Comments)    Increased pulse, fainting, diarrhea, thrush  . Xylocaine [Lidocaine Hcl]     With epinephrine, given by dentist.  Speeded up heart rate and she passed out (occured twice, at dentist)  . Zoloft [Sertraline Hcl] Swelling and Other (See Comments)    Migraine Swelling of tongue/lip (09/2012)  . Advil [Ibuprofen] Other (See Comments)    Motrin ok with a GI effect.  . Clarithromycin Rash    Started after completing 10 day course of 2000 mg /day   ROS:  Denies dysuria, hematuria.No diarrhea. No fever, chills, URI symptoms, cough, chest pain.  She had slight nausea this morning, resolved. No vomiting. +depression and anxiety   PHYSICAL EXAM:  BP 110/70   Pulse 73   Temp 98 F (36.7 C) (Oral)   Ht 4\' 11"  (1.499 m)   Wt 106 lb (48.1 kg)   SpO2 98%   BMI 21.41 kg/m   Elderly female, appears depressed, periodically crying during visit.  Normal speech, eye contact okay intermittently. She is accompanied by her husband. She does not appear to be in any pain or distress (just depressed). HEENT: conjunctiva and sclera are clear, EOMI Neck: no lymphadenopathy or mass Heart: regular rate and rhythm Lungs: clear bilaterally Abdomen: soft, normal bowel sounds. She is mildly tender in epigastrium, extending bilaterally in upper abdomen. No rebound, guarding or masses. Extremities: no edema Skin: normal turgor, no rash Psych: depressed mood and affect. Normal hygiene and grooming.  ASSESSMENT/PLAN:  Upper abdominal pain - some intermittent reflux symptoms--rec Mylanta Plus prn.  Pain level already improved   History of UTI - on macrobid and urine has improved (still has leuks, but improved from dip in UC); to complete ABX course - Plan: POCT Urinalysis DIP (Proadvantage Device)  Irritable bowel syndrome, unspecified type - may continue Bentyl prn cramping   Severe recurrent  major depression without psychotic features ([place]) - Counseled some today. Encouraged her to f/u with therapist and psych. Plans for repeat [facility] were postponed due to COVID

## 2019-02-13 NOTE — Patient Instructions (Signed)
Please be sure to follow up with your therapist and psychiatrist for your depression. Please try and focus on things that make you happy, even for just a short bit during the day.  Try taking Mylanta Plus if you need to take something for your discomfort. I'm glad that your pain has eased off some.

## 2019-02-18 DIAGNOSIS — M25561 Pain in right knee: Secondary | ICD-10-CM | POA: Diagnosis not present

## 2019-02-19 ENCOUNTER — Telehealth: Payer: Self-pay | Admitting: Gastroenterology

## 2019-02-19 ENCOUNTER — Encounter: Payer: Self-pay | Admitting: Family Medicine

## 2019-02-19 NOTE — Telephone Encounter (Signed)
Patient husband called said he has a question regarding medication BENTYL

## 2019-02-19 NOTE — Telephone Encounter (Signed)
Tried to reach Tracy Malone and the phone just rings busy, no voicemail.

## 2019-02-19 NOTE — Progress Notes (Signed)
Chief Complaint  Patient presents with  . Urinary Frequency  . Diarrhea  . Knee Pain    right knee swollen   . Arm Injury    cut on left arm     Patient presents for recheck of her urine, s/p treatment for UTI, as well as complaint of arm pain. Message sent by patient yesterday: "I think I need recheck on uti. Been peeing 3 times at night and sweaty and clammy. I cut lower left arm with piece card board box lid, it was pretty deep.  I checked it this morning and it's pretty red.  Arm hurts .  What do I do next.  I feel awful I never had my arm hurt like this before.   That left side of abdominal area still feels swollen, feels like something in there that shouldn't be THERE."  UTI diagnosed and treated by UC, seen 7/18, treated with macrobid.  She completed the course. 2 days ago she noticed more frequent urination at night.  She drinks water before bed (to take her medications).  She was up 3x/ last night, no change in water intake from her normal routine. No dysuria. No frequency during the day, only at night, for the last 3 nights. No caffeine, alcohol. Thinks she is waking up at night, and then going, not that the urge to go is waking her up.  Diarrhea has been off and on.  It had gotten a little worse when the macrobid was first started, but improved some.  She stopped taking the colace when she started the antibiotics.  She had increased diarrhea 5 days ago.  5x on the first day, 4x Sunday, 3 episodes Monday, none yesterday, once so far today. Yesterday she had left sided discomfort (chronic, but was a little worse yesterday).   No blood or mucus in the stool (maybe initially she noticed some mucus, this past weekend, not recently).   No constipation/straining. She was sweaty the last 3 mornings.  No longer having the sweats at night like she used to.  She took Bentyl this morning, due to having spasms on the left side; it does help some, not completely, doesn't last long enough,  takes a while to work.  Helps for about 2.5 hours.   She passes gas, and the pain improves. She is asking whether or not she should be taking the bentyl on a more regular basis, as opposed to prn cramping.  She cut her left forearm with cardboard yesterday.  Felt like it was deep--described as a skin tear.  She cleaned it, skin put back in place. She reports bleeding stopped, and she was surprised how much better it looked today. Still sore, but much better than yesterday.  R knee is swollen.  She is under the care of ortho and was given a brace yesterday. She isn't wearing it today, said it was swollen and couldn't get it on. Reports that the swelling is on the top part of the knee, hurts to touch.  PMH, PSH, SH reviewed  Outpatient Encounter Medications as of 02/20/2019  Medication Sig  . ALPRAZolam (XANAX) 0.5 MG tablet TAKE BY MOUTH UP TO 3 TIMES DAILY. WARNING: BENZODIAZEPINES INCREASE THE RISK OF FALLS AND NEUROCOGNITIVE DECLINE IN THE ELDERLY (Patient taking differently: Take 0.5 mg by mouth 4 (four) times daily as needed for anxiety. )  . Carbidopa-Levodopa ER (SINEMET CR) 25-100 MG tablet controlled release Take 1 tablet by mouth 4 (four) times daily. (Patient taking differently:  Take 1 tablet by mouth 3 (three) times daily. )  . Cholecalciferol (VITAMIN D) 2000 UNITS tablet Take 2,000 Units by mouth daily.  Marland Kitchen dicyclomine (BENTYL) 10 MG capsule Take 1 capsule (10 mg total) by mouth every 6 (six) hours as needed for spasms.  . metoprolol tartrate (LOPRESSOR) 25 MG tablet 1/2 to 1 po in the morning  . Probiotic Product (ALIGN PO) Take 1 capsule by mouth daily.   Marland Kitchen SYNTHROID 25 MCG tablet TAKE 1 TABLET DAILY BEFORE BREAKFAST (Patient taking differently: Take 25 mcg by mouth daily before breakfast. )   No facility-administered encounter medications on file as of 02/20/2019.    Allergies  Allergen Reactions  . Iodine Anaphylaxis    IV and topical forms.  Burnard Leigh [Hyoscyamine Sulfate]      Vision problems/pt has glaucoma  . Salmon [Fish Allergy] Hives and Shortness Of Breath  . Shellfish Allergy Anaphylaxis  . Remeron [Mirtazapine] Other (See Comments)    Cause blurred vision and red eyes, pt has glaucoma  . Aspirin Other (See Comments)    Sever stomach pain due to ulcer scaring.  . Ciprofloxacin Diarrhea  . Codeine Nausea And Vomiting  . Darvocet [Propoxyphene N-Acetaminophen] Nausea And Vomiting  . Demerol  [Meperidine Hcl]   . Demerol [Meperidine] Nausea Only  . Dexilant [Dexlansoprazole] Swelling    Redness, swelling and peeling of both feet.  . Diphedryl [Diphenhydramine] Other (See Comments)    Increased pulse/small amount ok  . Doxycycline Hyclate Other (See Comments)    GI intolerance.  Marland Kitchen Epinephrine Other (See Comments)    Breathing problems  . Erythromycin Other (See Comments)    GI intolerance.  Yvette Rack [Cyclobenzaprine] Other (See Comments)    Tingly/prickly sensation.  Marland Kitchen Keflex [Cephalexin] Hives  . Latex Other (See Comments)    Gloves ok.  Skin gets red from elastic in underwear and latex bandaides.  . Nitrofurantoin Diarrhea  . Other   . Prednisone Other (See Comments)    Headache  . Pylera [Bis Subcit-Metronid-Tetracyc] Swelling    Tongue swelling. Face tingling  . Sulfa Antibiotics Other (See Comments)    Increased pulse, fainting, diarrhea, thrush  . Xylocaine [Lidocaine Hcl]     With epinephrine, given by dentist.  Speeded up heart rate and she passed out (occured twice, at dentist)  . Zoloft [Sertraline Hcl] Swelling and Other (See Comments)    Migraine Swelling of tongue/lip (09/2012)  . Advil [Ibuprofen] Other (See Comments)    Motrin ok with a GI effect.  . Clarithromycin Rash    Started after completing 10 day course of 2000 mg /day    ROS: no known fever, sweats in the morning per HPI.  No headaches, dizziness, chest pain.  +Left sided abdominal bloating/pain and cramping intermittently.  +diarrhea recently per HPI.  Urinary  frequency for the last 3 nights, no hematuria, dysuria, or frequency during the day.  Injury to left forearm--no longer as painful, looks better.  PHYSICAL EXAM:  BP 110/68   Pulse 76   Temp (!) 97.2 F (36.2 C) (Oral)   Ht 4\' 11"  (1.499 m)   Wt 105 lb 12.8 oz (48 kg)   SpO2 96%   BMI 21.37 kg/m   Wt Readings from Last 3 Encounters:  02/20/19 105 lb 12.8 oz (48 kg)  02/13/19 106 lb (48.1 kg)  02/06/19 107 lb (48.5 kg)   Elderly female, accompanied by her husband, in no distress.  +anxious, many complaints HEENT: conjunctiva and sclera are  clear, EOMI Heart: regular rate and rhythm Lungs: clear bilaterally  L forearm-- Dime size area of erythema Skin tear that is back in place, healing.  Wound edges completely approximated. No bruising or soft tissue swelling. Mildly tender.  R knee--minimal soft tissue swelling superior to the right knee. Mildly tender. No erythema or warmth in this area. No warmth; no effusion at the knee itself.    Abdomen: mildly tender in epigastrium and slightly to the left. nontender at LUQ (where she reportedly gets her spasms--took Bentyl earlier today). nontender suprapubically. When LLQ palpated, nontender, but made her have urinary urgency, felt like she had to void. Back: no CVA tenderness  Urine dip: cloudy, SG 1.000, 3+ leuks, no blood.  ASSESSMENT/PLAN:  Urinary frequency - only at night x3d, no other UTI sx. Having diarrhea, a risk. send for cx and treat per culture.  - Plan: POCT Urinalysis DIP (Proadvantage Device), Urine Culture  Skin tear of left forearm without complication, initial encounter - edges well approximated and healing very nicely--reassured.   Irritable bowel syndrome, unspecified type - cont to use bentyl prn only (LUQ spasms)   Abnormal urinalysis - Plan: Urine Culture  Abdominal bloating - improvement in discomfort after passing gas--discussed foods/diet. May try simethicone prn  Diarrhea, unspecified type -  unclear if related to her IBS, vs constipation, vs other etiology (recent ABX use). without fever and benign exam, supportive measures; plans to f/u with GI   Acute pain of right knee - saw ortho yest--encouraged to wear brace. Reassured--suspect strain vs bursitis

## 2019-02-20 ENCOUNTER — Ambulatory Visit (INDEPENDENT_AMBULATORY_CARE_PROVIDER_SITE_OTHER): Payer: Medicare Other | Admitting: Psychiatry

## 2019-02-20 ENCOUNTER — Encounter: Payer: Self-pay | Admitting: Family Medicine

## 2019-02-20 ENCOUNTER — Other Ambulatory Visit: Payer: Self-pay

## 2019-02-20 ENCOUNTER — Ambulatory Visit (INDEPENDENT_AMBULATORY_CARE_PROVIDER_SITE_OTHER): Payer: Medicare Other | Admitting: Family Medicine

## 2019-02-20 VITALS — BP 110/68 | HR 76 | Temp 97.2°F | Ht 59.0 in | Wt 105.8 lb

## 2019-02-20 DIAGNOSIS — R140 Abdominal distension (gaseous): Secondary | ICD-10-CM | POA: Diagnosis not present

## 2019-02-20 DIAGNOSIS — R197 Diarrhea, unspecified: Secondary | ICD-10-CM | POA: Diagnosis not present

## 2019-02-20 DIAGNOSIS — K589 Irritable bowel syndrome without diarrhea: Secondary | ICD-10-CM

## 2019-02-20 DIAGNOSIS — I6521 Occlusion and stenosis of right carotid artery: Secondary | ICD-10-CM

## 2019-02-20 DIAGNOSIS — F411 Generalized anxiety disorder: Secondary | ICD-10-CM | POA: Diagnosis not present

## 2019-02-20 DIAGNOSIS — R8290 Unspecified abnormal findings in urine: Secondary | ICD-10-CM | POA: Diagnosis not present

## 2019-02-20 DIAGNOSIS — F332 Major depressive disorder, recurrent severe without psychotic features: Secondary | ICD-10-CM | POA: Diagnosis not present

## 2019-02-20 DIAGNOSIS — R350 Frequency of micturition: Secondary | ICD-10-CM

## 2019-02-20 DIAGNOSIS — F410 Panic disorder [episodic paroxysmal anxiety] without agoraphobia: Secondary | ICD-10-CM | POA: Diagnosis not present

## 2019-02-20 DIAGNOSIS — M25561 Pain in right knee: Secondary | ICD-10-CM | POA: Diagnosis not present

## 2019-02-20 DIAGNOSIS — S51812A Laceration without foreign body of left forearm, initial encounter: Secondary | ICD-10-CM

## 2019-02-20 DIAGNOSIS — F5101 Primary insomnia: Secondary | ICD-10-CM | POA: Diagnosis not present

## 2019-02-20 LAB — POCT URINALYSIS DIP (PROADVANTAGE DEVICE)
Bilirubin, UA: NEGATIVE
Blood, UA: NEGATIVE
Glucose, UA: NEGATIVE mg/dL
Ketones, POC UA: NEGATIVE mg/dL
Nitrite, UA: NEGATIVE
Protein Ur, POC: NEGATIVE mg/dL
Specific Gravity, Urine: 1
Urobilinogen, Ur: NEGATIVE
pH, UA: 6 (ref 5.0–8.0)

## 2019-02-20 NOTE — Patient Instructions (Signed)
Since passing gas seems to ease off some of your discomfort, you may have some trapped gas, causing your left-sided pain.  Consider trying simethicone (Gas-X) when the pain is bad (and not able to pass the gas).    Talk to Dr. Ardis Hughs as to whether or not you should take any kind of fiber supplement or not.

## 2019-02-20 NOTE — Telephone Encounter (Signed)
I spoke to patient who states she has not been taking her Bentyl and has had loose stools for a few days.She started on an antibiotic a few days ago for a UTI and will be going back today for a recheck. She has some "fullness:on her upper left quad, that will come and go, she states she has felt this before when her colitis flairs up. Denies fever. She was advised to eat bland small frequent meals and take her bentyl 10mg  as needed for abdominal spasms. She will also discus her symptoms with her PCP today as her symptoms may be related to her antibiotic for her UTI. She will call back with any questions or concerns.

## 2019-02-24 ENCOUNTER — Other Ambulatory Visit: Payer: Self-pay | Admitting: *Deleted

## 2019-02-24 ENCOUNTER — Ambulatory Visit: Payer: Medicare Other | Admitting: Psychology

## 2019-02-24 ENCOUNTER — Telehealth: Payer: Self-pay | Admitting: Gastroenterology

## 2019-02-24 LAB — URINE CULTURE

## 2019-02-24 MED ORDER — CIPROFLOXACIN HCL 250 MG PO TABS: 250.0000 mg | ORAL_TABLET | Freq: Two times a day (BID) | ORAL | 0 refills | Status: DC

## 2019-02-24 NOTE — Telephone Encounter (Signed)
The pt's husband called to report the pt continues to have lower abd discomfort.  She does not have diarrhea but feels like she has the urge to mover her bowels.  She is not taking the bentyl at this time.  I did advise her to take the bentyl as prescribed and call back if this does not help the spasms.  She also has an appt the first of September and will keep that appt as planned unless needed sooner.

## 2019-02-24 NOTE — Telephone Encounter (Signed)
Pt 's husband called to inform that pt's sxs have no resolved completely yet, she is still having diarrhea on and off and abd pain. He would like some advise.

## 2019-02-25 ENCOUNTER — Encounter (HOSPITAL_COMMUNITY): Payer: Self-pay | Admitting: Emergency Medicine

## 2019-02-25 ENCOUNTER — Emergency Department (HOSPITAL_COMMUNITY): Payer: Medicare Other

## 2019-02-25 ENCOUNTER — Other Ambulatory Visit: Payer: Self-pay

## 2019-02-25 ENCOUNTER — Telehealth: Payer: Self-pay | Admitting: Family Medicine

## 2019-02-25 ENCOUNTER — Ambulatory Visit (INDEPENDENT_AMBULATORY_CARE_PROVIDER_SITE_OTHER)
Admission: EM | Admit: 2019-02-25 | Discharge: 2019-02-25 | Disposition: A | Discharge status: Home / Self Care | Payer: Medicare Other | Source: Home / Self Care

## 2019-02-25 ENCOUNTER — Emergency Department (HOSPITAL_COMMUNITY)
Admission: EM | Admit: 2019-02-25 | Discharge: 2019-02-25 | Disposition: A | Discharge status: Home / Self Care | Payer: Medicare Other | Attending: Emergency Medicine | Admitting: Emergency Medicine

## 2019-02-25 DIAGNOSIS — Z79899 Other long term (current) drug therapy: Secondary | ICD-10-CM | POA: Diagnosis not present

## 2019-02-25 DIAGNOSIS — K219 Gastro-esophageal reflux disease without esophagitis: Secondary | ICD-10-CM | POA: Insufficient documentation

## 2019-02-25 DIAGNOSIS — R1010 Upper abdominal pain, unspecified: Secondary | ICD-10-CM

## 2019-02-25 DIAGNOSIS — R1013 Epigastric pain: Secondary | ICD-10-CM

## 2019-02-25 DIAGNOSIS — N83202 Unspecified ovarian cyst, left side: Secondary | ICD-10-CM | POA: Diagnosis not present

## 2019-02-25 DIAGNOSIS — K589 Irritable bowel syndrome without diarrhea: Secondary | ICD-10-CM | POA: Insufficient documentation

## 2019-02-25 DIAGNOSIS — Z91040 Latex allergy status: Secondary | ICD-10-CM | POA: Insufficient documentation

## 2019-02-25 DIAGNOSIS — R109 Unspecified abdominal pain: Secondary | ICD-10-CM

## 2019-02-25 DIAGNOSIS — E039 Hypothyroidism, unspecified: Secondary | ICD-10-CM | POA: Insufficient documentation

## 2019-02-25 LAB — COMPREHENSIVE METABOLIC PANEL
ALT: 17 U/L (ref 0–44)
AST: 20 U/L (ref 15–41)
Albumin: 3.8 g/dL (ref 3.5–5.0)
Alkaline Phosphatase: 67 U/L (ref 38–126)
Anion gap: 10 (ref 5–15)
BUN: 10 mg/dL (ref 8–23)
CO2: 27 mmol/L (ref 22–32)
Calcium: 9.3 mg/dL (ref 8.9–10.3)
Chloride: 104 mmol/L (ref 98–111)
Creatinine, Ser: 0.71 mg/dL (ref 0.44–1.00)
GFR calc Af Amer: >60 mL/min (ref >60)
GFR calc non Af Amer: >60 mL/min (ref >60)
Glucose, Bld: 105 mg/dL — ABNORMAL HIGH (ref 70–99)
Potassium: 3.6 mmol/L (ref 3.5–5.1)
Sodium: 141 mmol/L (ref 135–145)
Total Bilirubin: 1.4 mg/dL — ABNORMAL HIGH (ref 0.3–1.2)
Total Protein: 7 g/dL (ref 6.5–8.1)

## 2019-02-25 LAB — URINALYSIS, ROUTINE W REFLEX MICROSCOPIC
Bilirubin Urine: NEGATIVE
Glucose, UA: NEGATIVE mg/dL
Ketones, ur: 5 mg/dL — AB
Nitrite: NEGATIVE
Protein, ur: NEGATIVE mg/dL
Specific Gravity, Urine: 1.003 — ABNORMAL LOW (ref 1.005–1.030)
WBC, UA: >50 WBC/hpf — ABNORMAL HIGH (ref 0–5)
pH: 6 (ref 5.0–8.0)

## 2019-02-25 LAB — CBC
HCT: 49.4 % — ABNORMAL HIGH (ref 36.0–46.0)
Hemoglobin: 15.6 g/dL — ABNORMAL HIGH (ref 12.0–15.0)
MCH: 29.3 pg (ref 26.0–34.0)
MCHC: 31.6 g/dL (ref 30.0–36.0)
MCV: 92.7 fL (ref 80.0–100.0)
Platelets: 305 10*3/uL (ref 150–400)
RBC: 5.33 MIL/uL — ABNORMAL HIGH (ref 3.87–5.11)
RDW: 13.5 % (ref 11.5–15.5)
WBC: 10.2 10*3/uL (ref 4.0–10.5)
nRBC: 0 % (ref 0.0–0.2)

## 2019-02-25 LAB — LIPASE, BLOOD: Lipase: 39 U/L (ref 11–51)

## 2019-02-25 MED ORDER — CARBIDOPA-LEVODOPA ER 25-100 MG PO TBCR
1.0000 | EXTENDED_RELEASE_TABLET | Freq: Once | ORAL | Status: DC
  Filled 2019-02-25: qty 1

## 2019-02-25 MED ORDER — SODIUM CHLORIDE 0.9% FLUSH: 3.0000 mL | Freq: Once | INTRAVENOUS | Status: DC

## 2019-02-25 MED ORDER — SODIUM CHLORIDE 0.9 % IV BOLUS
1000.0000 mL | Freq: Once | INTRAVENOUS | Status: AC
  Administered 2019-02-25: 1000 mL via INTRAVENOUS

## 2019-02-25 MED ORDER — FAMOTIDINE IN NACL 20-0.9 MG/50ML-% IV SOLN
20.0000 mg | Freq: Once | INTRAVENOUS | Status: AC
  Administered 2019-02-25: 20 mg via INTRAVENOUS
  Filled 2019-02-25: qty 50

## 2019-02-25 MED ORDER — PANTOPRAZOLE SODIUM 40 MG IV SOLR
40.0000 mg | Freq: Once | INTRAVENOUS | Status: AC
  Administered 2019-02-25: 40 mg via INTRAVENOUS
  Filled 2019-02-25: qty 40

## 2019-02-25 MED ORDER — ALUM & MAG HYDROXIDE-SIMETH 200-200-20 MG/5ML PO SUSP
30.0000 mL | Freq: Once | ORAL | Status: AC
  Administered 2019-02-25: 30 mL via ORAL
  Filled 2019-02-25: qty 30

## 2019-02-25 NOTE — Telephone Encounter (Signed)
Ned Card called to let you know her mom went to urgent care today and they sent her over to the ER and she has been there since 11am sitting in the lobby in the wheelchair with belly pain.

## 2019-02-25 NOTE — ED Notes (Addendum)
Pt placed on cardiac monitor at this time. D/t sudden increase in pulse rate with sudden decrease. Elevated HR unsustained RN unable to confirm with physical pulse d/t sudden decrease. PA informed. Will continue to monitor cardiac monitor.

## 2019-02-25 NOTE — Telephone Encounter (Signed)
Chart reviewed.  At the time of the message she had labs done, and was headed to [place].  She has been treated/discharged, and notes reviewed.

## 2019-02-25 NOTE — ED Notes (Signed)
Pt aware that urine sample is needed, but is unable to provide one at this time 

## 2019-02-25 NOTE — ED Notes (Signed)
Signature pad for DC not available

## 2019-02-25 NOTE — ED Triage Notes (Signed)
Pt c/o upper abdominal pain since "I got out of the hospital for colitis". Pt states that was in "may".

## 2019-02-25 NOTE — ED Notes (Signed)
Discharge instructions discussed with pt and spouse at bedside. Pt verbalized follow up with GI. No questions at this time.

## 2019-02-25 NOTE — ED Notes (Signed)
Patient transported to CT 

## 2019-02-25 NOTE — Discharge Instructions (Signed)
Findings were reassuring today.  Follow-up with your GI specialist.  Return to the emergency department, as needed.

## 2019-02-25 NOTE — ED Triage Notes (Signed)
Onset 1 week ago developed general abdominal pain with soft stool. Last BM was yesterday and has a bloating feeling, Stated spoke with GI doctor last week and not feeling better and has a history of colitis. Denies nausea and emesis.

## 2019-02-25 NOTE — Discharge Instructions (Addendum)
Go to the emergency department for evaluation of your acute abdominal pain. 

## 2019-02-25 NOTE — ED Provider Notes (Signed)
Tracy Malone    CSN: [id number] Arrival date & time: 02/25/19  1122     History   Chief Complaint Chief Complaint  Patient presents with  . Abdominal Pain    HPI Tracy Malone is a 85 y.o. female.   Patient presents with acute abdominal pain x2 days.  She states the pain is sharp and constant.  She denies fever, chills, vomiting, diarrhea; but does report nausea.  She denies any injuries.  She was previously treated for a UTI with Macrobid on 02/08/2019 but the urine culture indicated that a different antibiotic was required; she was then prescribed Cipro which patient has not taken.  Patient was hospitalized on 11/21/2018 for colitis.    The history is provided by the patient.    Past Medical History:  Diagnosis Date  . Bell's palsy 1966   ? right side facial droop  . Carotid artery disease ([place]) 2010   on vascular screening;unchanged 2013.(could not tolerate simvastatin, no other statins tried)--<30% blockage bilat 07/2011  . Chronic abdominal pain   . Chronic fatigue and malaise   . Claustrophobia   . Depression    treated in the past for years;stopped in 2010 for a years  . Duodenal ulcer 1962   h/o  . Dysrhythmia    ocassional PVC's  . Fibromyalgia   . Frequent PVCs 07/2012   Seen by [facility]: benign, asymptomatic, normal EF  . GERD (gastroesophageal reflux disease)   . Glaucoma, narrow-angle    s/p laser surgery  . History of hiatal hernia    during endoscopy  . Hypothyroid 9/08  . IBS (irritable bowel syndrome)    Dr. Benson [place]  . Ischemic colitis ([place]) 11/21/2018  . Ocular migraine   . Osteoporosis 10/11   Dr.Hawkes  . Panic attack   . Recurrent UTI    has cystocele-Dr.Grewal  . Shingles 1999   h/o  . Superficial thrombophlebitis 03/2009   RLE  . Trochanteric bursitis 12/2008   bilateral    Patient Active Problem List   Diagnosis Date Noted  . Essential tremor 02/06/2019  . Ischemic colitis ([place])   . Leukocytosis   . Acute colitis  11/22/2018  . Colitis 11/22/2018  . Acute lower UTI 11/22/2018  . Acute cystitis without hematuria   . Parkinson's disease ([place]) 10/24/2018  . Gait disturbance 11/28/2017  . Chronic lymphocytic thyroiditis 04/24/2017  . Status post removal of thyroid nodule 04/24/2017  . Depression with anxiety 03/16/2017  . Subcutaneous nodules 03/16/2017  . Aortic atherosclerosis ([place]) 11/24/2016  . Allergy to multiple antibiotics 10/07/2016  . Seafood allergy, anaphylaxis, subsequent encounter 10/07/2016  . Helicobacter pylori gastritis 10/07/2016  . Fall 09/05/2016  . Bloating 08/15/2016  . Severe recurrent major depression without psychotic features ([place]) 04/13/2016    Class: Chronic  . Rash and nonspecific skin eruption 03/01/2016  . Leg pain, bilateral 03/01/2016  . Functional dyspepsia 10/13/2015  . Subacromial bursitis 03/02/2015  . Resting tremor 02/01/2015  . Epigastric fullness 10/12/2014  . Early satiety 10/12/2014  . Cystocele 12/30/2013  . IBS (irritable bowel syndrome) 09/30/2013  . GERD (gastroesophageal reflux disease) 06/27/2013  . PVC's (premature ventricular contractions) 10/02/2012  . Depressive disorder, not elsewhere classified 10/02/2012  . Bradycardia 08/01/2012  . Fatigue 08/01/2012  . Anxiety state 05/13/2012  . Osteopenia 05/13/2012  . Hypothyroidism 05/13/2012    Past Surgical History:  Procedure Laterality Date  . ABDOMINAL HYSTERECTOMY    . CATARACT EXTRACTION, BILATERAL  1995, 1996  .  Flexible sigmoidoscopy    . THYROIDECTOMY, PARTIAL  09/2005   L nodule; Dr. Harlow Asa  . TONSILLECTOMY  1946  . UPPER GI ENDOSCOPY  06/27/12  . VAGINAL HYSTERECTOMY  1971   and bladder repair.  Still has ovaries    OB History    Gravida  2   Para  2   Term      Preterm      AB      Living  2     SAB      TAB      Ectopic      Multiple      Live Births               Home Medications    Prior to Admission medications   Medication Sig Start Date  End Date Taking? Authorizing Provider  ALPRAZolam (XANAX) 0.5 MG tablet TAKE BY MOUTH UP TO 3 TIMES DAILY. WARNING: BENZODIAZEPINES INCREASE THE RISK OF FALLS AND NEUROCOGNITIVE DECLINE IN THE ELDERLY Patient taking differently: Take 0.5 mg by mouth 4 (four) times daily as needed for anxiety.  02/18/18   Eksir, Richard Miu, MD  Carbidopa-Levodopa ER (SINEMET CR) 25-100 MG tablet controlled release Take 1 tablet by mouth 4 (four) times daily. Patient taking differently: Take 1 tablet by mouth 3 (three) times daily.  10/24/18   Tat, Eustace Quail, DO  Cholecalciferol (VITAMIN D) 2000 UNITS tablet Take 2,000 Units by mouth daily.    [provider]  ciprofloxacin (CIPRO) 250 MG tablet Take 1 tablet (250 mg total) by mouth 2 (two) times daily. 02/24/19   [name], MD  dicyclomine (BENTYL) 10 MG capsule Take 1 capsule (10 mg total) by mouth every 6 (six) hours as needed for spasms. 02/05/19   Milus Banister, MD  metoprolol tartrate (LOPRESSOR) 25 MG tablet 1/2 to 1 po in the morning 02/06/19   Sater, Nanine Means, MD  Probiotic Product (ALIGN PO) Take 1 capsule by mouth daily.     [provider]  SYNTHROID 25 MCG tablet TAKE 1 TABLET DAILY BEFORE BREAKFAST Patient taking differently: Take 25 mcg by mouth daily before breakfast.  09/30/18   [name], MD    Family History Family History  Problem Relation Age of Onset  . Heart disease Mother   . Hypertension Mother   . Hypertension Sister   . HIV Son   . Heart disease Brother   . Lung cancer Brother        lung  . Diabetes Maternal Grandfather   . Diabetes Granddaughter        type 1    Social History Social History   Tobacco Use  . Smoking status: Never Smoker  . Smokeless tobacco: Never Used  Substance Use Topics  . Alcohol use: No    Alcohol/week: 0.0 standard drinks  . Drug use: No     Allergies   Iodine, Levsin [hyoscyamine sulfate], Salmon [fish allergy], Shellfish allergy, Remeron [mirtazapine], Aspirin,  Ciprofloxacin, Codeine, Darvocet [propoxyphene n-acetaminophen], Demerol  [meperidine hcl], Demerol [meperidine], Dexilant [dexlansoprazole], Diphedryl [diphenhydramine], Doxycycline hyclate, Epinephrine, Erythromycin, Flexeril [cyclobenzaprine], Keflex [cephalexin], Latex, Nitrofurantoin, Other, Prednisone, Pylera [bis subcit-metronid-tetracyc], Sulfa antibiotics, Xylocaine [lidocaine hcl], Zoloft [sertraline hcl], Advil [ibuprofen], and Clarithromycin   Review of Systems Review of Systems  Constitutional: Negative for chills and fever.  HENT: Negative for ear pain and sore throat.   Eyes: Negative for pain and visual disturbance.  Respiratory: Negative for cough and shortness of breath.   Cardiovascular:  Negative for chest pain and palpitations.  Gastrointestinal: Positive for abdominal pain and nausea. Negative for diarrhea and vomiting.  Genitourinary: Negative for dysuria and hematuria.  Musculoskeletal: Negative for arthralgias and back pain.  Skin: Negative for color change and rash.  Neurological: Negative for seizures and syncope.  All other systems reviewed and are negative.    Physical Exam Triage Vital Signs ED Triage Vitals [02/25/19 1136]  Enc Vitals Group     BP 137/73     Pulse Rate 71     Resp 18     Temp 97.8 F (36.6 C)     Temp src      SpO2 98 %     Weight      Height      Head Circumference      Peak Flow      Pain Score 7     Pain Loc      Pain Edu?      Excl. in [place]?    No data found.  Updated Vital Signs BP 137/73   Pulse 71   Temp 97.8 F (36.6 C)   Resp 18   SpO2 98%   Visual Acuity Right Eye Distance:   Left Eye Distance:   Bilateral Distance:    Right Eye Near:   Left Eye Near:    Bilateral Near:     Physical Exam Vitals signs and nursing note reviewed.  Constitutional:      General: She is not in acute distress.    Appearance: She is well-developed.  HENT:     Head: Normocephalic and atraumatic.     Mouth/Throat:      Mouth: Mucous membranes are moist.  Eyes:     Conjunctiva/sclera: Conjunctivae normal.  Neck:     Musculoskeletal: Neck supple.  Cardiovascular:     Rate and Rhythm: Normal rate and regular rhythm.     Heart sounds: No murmur.  Pulmonary:     Effort: Pulmonary effort is normal. No respiratory distress.     Breath sounds: Normal breath sounds.  Abdominal:     General: Bowel sounds are normal.     Palpations: Abdomen is soft.     Tenderness: There is abdominal tenderness. There is no right CVA tenderness, left CVA tenderness, guarding or rebound.  Skin:    General: Skin is warm and dry.  Neurological:     Mental Status: She is alert.      UC Treatments / Results  Labs (all labs ordered are listed, but only abnormal results are displayed) Labs Reviewed - No data to display  EKG   Radiology No results found.  Procedures Procedures (including critical care time)  Medications Ordered in UC Medications - No data to display  Initial Impression / Assessment and Plan / UC Course  I have reviewed the triage vital signs and the nursing notes.  Pertinent labs & imaging results that were available during my care of the patient were reviewed by me and considered in my medical decision making (see chart for details).   Abdominal pain.  Patient is acutely tender in her upper abdomen.  Sending to the ED for evaluation.     Final Clinical Impressions(s) / UC Diagnoses   Final diagnoses:  Abdominal pain, unspecified abdominal location     Discharge Instructions     Go to the emergency department for evaluation of your acute abdominal pain.      ED Prescriptions    None  Controlled Substance Prescriptions [place] Controlled Substance Registry consulted? Not Applicable   Sharion Balloon, NP 02/25/19 1201

## 2019-02-25 NOTE — ED Notes (Signed)
Spoke with patient states did not take her parkinson medication this morning and 1300 dose. Spoke with ED doctor ordered her home medication Sinemet CR 25/100mg 

## 2019-02-25 NOTE — ED Provider Notes (Signed)
[facility] EMERGENCY DEPARTMENT Provider Note   CSN: [id number] Arrival date & time: 02/25/19  1156    History   Chief Complaint Chief Complaint  Patient presents with  . Abdominal Pain    HPI Tracy Malone is a 85 y.o. female.     HPI   Tracy Malone is a 85 y.o. female, with a history of chronic and recurrent abdominal pain, duodenal ulcer, GERD, IBS, colitis, presenting to the ED with abdominal discomfort.  Discomfort is epigastric, burning and "feels like swelling like balloons," currently rated 8/10, nonradiating. Was previously intermittent, but last three days has been constant and worse. Accompanied by some "mucousy" loose stools.  She had been having similar pain intermittently for the last 2.5 week and has been in contact with her GI specialist, Dr. Ardis Hughs ([facility]), previously on this matter and had been treated with Bentyl, but states this seems to make the discomfort worse.  Last EGD was 2018, cultures positive for H. Pylori.   Patient was diagnosed with an UTI July 23 due to symptoms of urinary frequency, generalized weakness, and bilateral lower back pain (states this is typical for her UTIs) beginning a few days prior. She took macrobid 7 days and seemed to improve, however, her PCP told her the culture showed macrobid was not good coverage. She was prescribed Cipro, but has not taken it because she states this medication typically causes GI upset and did not want to take it and worsen her current symptoms.   Denies fever/chills, N/V, hematochezia/melena, chest pain, shortness of breath, back pain, or any other complaints.  Past Medical History:  Diagnosis Date  . Bell's palsy 1966   ? right side facial droop  . Carotid artery disease ([place]) 2010   on vascular screening;unchanged 2013.(could not tolerate simvastatin, no other statins tried)--<30% blockage bilat 07/2011  . Chronic abdominal pain   . Chronic fatigue and malaise   .  Claustrophobia   . Depression    treated in the past for years;stopped in 2010 for a years  . Duodenal ulcer 1962   h/o  . Dysrhythmia    ocassional PVC's  . Fibromyalgia   . Frequent PVCs 07/2012   Seen by [facility]: benign, asymptomatic, normal EF  . GERD (gastroesophageal reflux disease)   . Glaucoma, narrow-angle    s/p laser surgery  . History of hiatal hernia    during endoscopy  . Hypothyroid 9/08  . IBS (irritable bowel syndrome)    Dr. Benson Norway  . Ischemic colitis ([place]) 11/21/2018  . Ocular migraine   . Osteoporosis 10/11   Dr.Hawkes  . Panic attack   . Recurrent UTI    has cystocele-Dr.Grewal  . Shingles 1999   h/o  . Superficial thrombophlebitis 03/2009   RLE  . Trochanteric bursitis 12/2008   bilateral    Patient Active Problem List   Diagnosis Date Noted  . Essential tremor 02/06/2019  . Ischemic colitis ([place])   . Leukocytosis   . Acute colitis 11/22/2018  . Colitis 11/22/2018  . Acute lower UTI 11/22/2018  . Acute cystitis without hematuria   . Parkinson's disease ([place]) 10/24/2018  . Gait disturbance 11/28/2017  . Chronic lymphocytic thyroiditis 04/24/2017  . Status post removal of thyroid nodule 04/24/2017  . Depression with anxiety 03/16/2017  . Subcutaneous nodules 03/16/2017  . Aortic atherosclerosis ([place]) 11/24/2016  . Allergy to multiple antibiotics 10/07/2016  . Seafood allergy, anaphylaxis, subsequent encounter 10/07/2016  . Helicobacter pylori gastritis 10/07/2016  .  Fall 09/05/2016  . Bloating 08/15/2016  . Severe recurrent major depression without psychotic features ([place]) 04/13/2016    Class: Chronic  . Rash and nonspecific skin eruption 03/01/2016  . Leg pain, bilateral 03/01/2016  . Functional dyspepsia 10/13/2015  . Subacromial bursitis 03/02/2015  . Resting tremor 02/01/2015  . Epigastric fullness 10/12/2014  . Early satiety 10/12/2014  . Cystocele 12/30/2013  . IBS (irritable bowel syndrome) 09/30/2013  . GERD  (gastroesophageal reflux disease) 06/27/2013  . PVC's (premature ventricular contractions) 10/02/2012  . Depressive disorder, not elsewhere classified 10/02/2012  . Bradycardia 08/01/2012  . Fatigue 08/01/2012  . Anxiety state 05/13/2012  . Osteopenia 05/13/2012  . Hypothyroidism 05/13/2012    Past Surgical History:  Procedure Laterality Date  . ABDOMINAL HYSTERECTOMY    . CATARACT EXTRACTION, BILATERAL  1995, 1996  . Flexible sigmoidoscopy    . THYROIDECTOMY, PARTIAL  09/2005   L nodule; Dr. Harlow Asa  . TONSILLECTOMY  1946  . UPPER GI ENDOSCOPY  06/27/12  . VAGINAL HYSTERECTOMY  1971   and bladder repair.  Still has ovaries     OB History    Gravida  2   Para  2   Term      Preterm      AB      Living  2     SAB      TAB      Ectopic      Multiple      Live Births               Home Medications    Prior to Admission medications   Medication Sig Start Date End Date Taking? Authorizing Provider  ALPRAZolam (XANAX) 0.5 MG tablet TAKE BY MOUTH UP TO 3 TIMES DAILY. WARNING: BENZODIAZEPINES INCREASE THE RISK OF FALLS AND NEUROCOGNITIVE DECLINE IN THE ELDERLY Patient taking differently: Take 0.5 mg by mouth 4 (four) times daily as needed for anxiety.  02/18/18   Eksir, Richard Miu, MD  Carbidopa-Levodopa ER (SINEMET CR) 25-100 MG tablet controlled release Take 1 tablet by mouth 4 (four) times daily. Patient taking differently: Take 1 tablet by mouth 3 (three) times daily.  10/24/18   Tat, Eustace Quail, DO  Cholecalciferol (VITAMIN D) 2000 UNITS tablet Take 2,000 Units by mouth daily.    [provider]  ciprofloxacin (CIPRO) 250 MG tablet Take 1 tablet (250 mg total) by mouth 2 (two) times daily. 02/24/19   [name], MD  dicyclomine (BENTYL) 10 MG capsule Take 1 capsule (10 mg total) by mouth every 6 (six) hours as needed for spasms. 02/05/19   Milus Banister, MD  metoprolol tartrate (LOPRESSOR) 25 MG tablet 1/2 to 1 po in the morning 02/06/19   Sater,  Nanine Means, MD  Probiotic Product (ALIGN PO) Take 1 capsule by mouth daily.     [provider]  SYNTHROID 25 MCG tablet TAKE 1 TABLET DAILY BEFORE BREAKFAST Patient taking differently: Take 25 mcg by mouth daily before breakfast.  09/30/18   [name], MD    Family History Family History  Problem Relation Age of Onset  . Heart disease Mother   . Hypertension Mother   . Hypertension Sister   . HIV Son   . Heart disease Brother   . Lung cancer Brother        lung  . Diabetes Maternal Grandfather   . Diabetes Granddaughter        type 1    Social History Social History  Tobacco Use  . Smoking status: Never Smoker  . Smokeless tobacco: Never Used  Substance Use Topics  . Alcohol use: No    Alcohol/week: 0.0 standard drinks  . Drug use: No     Allergies   Iodine, Levsin [hyoscyamine sulfate], Salmon [fish allergy], Shellfish allergy, Remeron [mirtazapine], Aspirin, Ciprofloxacin, Codeine, Darvocet [propoxyphene n-acetaminophen], Demerol  [meperidine hcl], Demerol [meperidine], Dexilant [dexlansoprazole], Diphedryl [diphenhydramine], Doxycycline hyclate, Epinephrine, Erythromycin, Flexeril [cyclobenzaprine], Keflex [cephalexin], Latex, Nitrofurantoin, Other, Prednisone, Pylera [bis subcit-metronid-tetracyc], Sulfa antibiotics, Xylocaine [lidocaine hcl], Zoloft [sertraline hcl], Advil [ibuprofen], and Clarithromycin   Review of Systems Review of Systems  Constitutional: Negative for chills, diaphoresis and fever.  Respiratory: Negative for cough and shortness of breath.   Cardiovascular: Negative for chest pain.  Gastrointestinal: Positive for abdominal pain. Negative for blood in stool, diarrhea, nausea and vomiting.  Genitourinary: Negative for dysuria, flank pain, frequency and hematuria.  All other systems reviewed and are negative.    Physical Exam Updated Vital Signs BP (!) 142/68   Pulse 83   Temp 98.1 F (36.7 C)   Resp 18   Ht 4\' 11"  (1.499 m)   Wt  46 kg   SpO2 97%   BMI 20.48 kg/m   Physical Exam Vitals signs and nursing note reviewed.  Constitutional:      General: She is not in acute distress.    Appearance: She is well-developed. She is not diaphoretic.  HENT:     Head: Normocephalic and atraumatic.     Mouth/Throat:     Mouth: Mucous membranes are moist.     Pharynx: Oropharynx is clear.  Eyes:     Conjunctiva/sclera: Conjunctivae normal.  Neck:     Musculoskeletal: Neck supple.  Cardiovascular:     Rate and Rhythm: Normal rate and regular rhythm.     Pulses: Normal pulses.          Radial pulses are 2+ on the right side and 2+ on the left side.       Posterior tibial pulses are 2+ on the right side and 2+ on the left side.     Heart sounds: Normal heart sounds.     Comments: Tactile temperature in the extremities appropriate and equal bilaterally. Pulmonary:     Effort: Pulmonary effort is normal. No respiratory distress.     Breath sounds: Normal breath sounds.  Abdominal:     Palpations: Abdomen is soft.     Tenderness: There is abdominal tenderness in the epigastric area. There is no guarding.  Musculoskeletal:     Right lower leg: No edema.     Left lower leg: No edema.  Lymphadenopathy:     Cervical: No cervical adenopathy.  Skin:    General: Skin is warm and dry.  Neurological:     Mental Status: She is alert.     Comments: Sensation grossly intact to light touch in the extremities.  Grip strengths equal bilaterally.  Strength 5/5 in all extremities.Coordination intact.   Psychiatric:        Mood and Affect: Mood and affect normal.        Speech: Speech normal.        Behavior: Behavior normal.      ED Treatments / Results  Labs (all labs ordered are listed, but only abnormal results are displayed) Labs Reviewed  COMPREHENSIVE METABOLIC PANEL - Abnormal; Notable for the following components:      Result Value   Glucose, Bld 105 (*)    Total Bilirubin 1.4 (*)  All other components within  normal limits  CBC - Abnormal; Notable for the following components:   RBC 5.33 (*)    Hemoglobin 15.6 (*)    HCT 49.4 (*)    All other components within normal limits  URINALYSIS, ROUTINE W REFLEX MICROSCOPIC - Abnormal; Notable for the following components:   APPearance CLOUDY (*)    Specific Gravity, Urine 1.003 (*)    Hgb urine dipstick SMALL (*)    Ketones, ur 5 (*)    Leukocytes,Ua LARGE (*)    WBC, UA >50 (*)    Bacteria, UA MANY (*)    All other components within normal limits  LIPASE, BLOOD    EKG None  Radiology Ct Abdomen Pelvis Wo Contrast  Result Date: 02/25/2019 CLINICAL DATA:  Generalized abdominal pain EXAM: CT ABDOMEN AND PELVIS WITHOUT CONTRAST TECHNIQUE: Multidetector CT imaging of the abdomen and pelvis was performed following the standard protocol without IV contrast. COMPARISON:  11/22/2018 FINDINGS: Lower chest: Slight decrease in and the previously described subpleural nodule in the posterior costophrenic angle on the right. This is likely postinflammatory in nature given its regression. Hepatobiliary: No focal liver abnormality is seen. No gallstones, gallbladder wall thickening, or biliary dilatation. Pancreas: Unremarkable. No pancreatic ductal dilatation or surrounding inflammatory changes. Spleen: Normal in size without focal abnormality. Adrenals/Urinary Tract: The adrenal glands are within normal limits. Kidneys are well visualized bilaterally without renal calculi. No obstructive changes are noted. The bladder is within normal limits. Stomach/Bowel: The appendix is within normal limits. Colon is well distended without focal obstructive changes. Previously seen thickening in the transverse colon is not well appreciated on today's exam. No small bowel obstructive or inflammatory changes are seen. The stomach is within normal limits. Vascular/Lymphatic: No significant vascular findings are present. No enlarged abdominal or pelvic lymph nodes. Reproductive: The  uterus has been surgically removed. Persistent left ovarian cyst is noted which measures 3.2 cm in greatest dimension. Other: No abdominal wall hernia or abnormality. No abdominopelvic ascites. Musculoskeletal: No acute or significant osseous findings. IMPRESSION: Previously seen right lower lobe subpleural nodule has decreased in size consistent with a postinflammatory nature. Left ovarian cyst stable from the prior exam. No other focal abnormality is seen. Electronically Signed   By: Inez Catalina M.D.   On: 02/25/2019 20:11   US Abdomen Limited Ruq  Result Date: 02/25/2019 CLINICAL DATA:  Epigastric pain EXAM: ULTRASOUND ABDOMEN LIMITED RIGHT UPPER QUADRANT COMPARISON:  None. FINDINGS: Gallbladder: No gallstones or wall thickening visualized. No sonographic Murphy sign noted by sonographer. Common bile duct: Diameter: 6 mm Liver: No focal lesion identified. Within normal limits in parenchymal echogenicity. Portal vein is patent on color Doppler imaging with normal direction of blood flow towards the liver. Other: None. IMPRESSION: Normal study.  No acute sonographic abnormality. Electronically Signed   By: Constance Holster M.D.   On: 02/25/2019 17:54    Procedures Procedures (including critical care time)  Medications Ordered in ED Medications  sodium chloride flush (NS) 0.9 % injection 3 mL (has no administration in time range)  Carbidopa-Levodopa ER (SINEMET CR) 25-100 MG tablet controlled release 1 tablet (1 tablet Oral Not Given 02/25/19 1819)  sodium chloride 0.9 % bolus 1,000 mL (0 mLs Intravenous Stopped 02/25/19 1918)  pantoprazole (PROTONIX) injection 40 mg (40 mg Intravenous Given 02/25/19 1807)  famotidine (PEPCID) IVPB 20 mg premix (0 mg Intravenous Stopped 02/25/19 1844)  alum & mag hydroxide-simeth (MAALOX/MYLANTA) 200-200-20 MG/5ML suspension 30 mL (30 mLs Oral Given 02/25/19 1816)  Initial Impression / Assessment and Plan / ED Course  I have reviewed the triage vital signs and the  nursing notes.  Pertinent labs & imaging results that were available during my care of the patient were reviewed by me and considered in my medical decision making (see chart for details).  Clinical Course as of Feb 24 2113  Tue Feb 25, 2019  1638 Noted values of 1.3 three months ago, then down to 0.4.  Total Bilirubin(!): 1.4 [SJ]  1850 Discussed ultrasound results with the patient.  Patient's husband is now at the bedside. States her original abdominal discomfort has resolved, but now describes a fullness in the left abdomen.    [SJ]  2103 Abnormalities on UA are already being managed by patient's PCP.  Urinalysis, Routine w reflex microscopic(!) [SJ]    Clinical Course User Index [SJ] Messiah Rovira C, PA-C       Patient presents with epigastric pain.  Patient is nontoxic appearing, afebrile, not tachycardic, not tachypneic, not hypotensive, maintains excellent SPO2 on room air, and is in no apparent distress.  No leukocytosis. Symptoms sound as if they could be related to ulcer, of which patient has a history.  She improved with Maalox, Protonix, and Pepcid. Ultrasound and CT abdomen were without acute abnormality.  Abdominal CT had to be performed without IV contrast due to patient's iodine allergy. The patient was given instructions for home care as well as return precautions. Patient voices understanding of these instructions, accepts the plan, and is comfortable with discharge.  Findings and plan of care discussed with Dorie Rank, MD. Dr. Tomi Bamberger personally evaluated and examined this patient.   Vitals:   02/25/19 1815 02/25/19 1845 02/25/19 1900 02/25/19 2010  BP: (!) 142/58 136/80 (!) 142/107 (!) 121/53  Pulse: 66 68 66 67  Resp:   17 (!) 22  Temp:      TempSrc:      SpO2: 99% 100% 100% 99%  Weight:      Height:         Final Clinical Impressions(s) / ED Diagnoses   Final diagnoses:  Epigastric pain    ED Discharge Orders    None       Layla Maw  02/25/19 2114    Dorie Rank, MD 02/26/19 1040

## 2019-02-25 NOTE — ED Notes (Signed)
Patient is being discharged from the [facility]gent [facility] and sent to the Emergency Department via wheelchair by staff. Per Barkley Boards, NP, patient is stable but in need of higher level of care due to abdominal pain. Patient is aware and verbalizes understanding of plan of care.  Vitals:   02/25/19 1136  BP: 137/73  Pulse: 71  Resp: 18  Temp: 97.8 F (36.6 C)  SpO2: 98%

## 2019-02-26 ENCOUNTER — Telehealth: Payer: Self-pay | Admitting: Gastroenterology

## 2019-02-26 NOTE — Telephone Encounter (Signed)
Returned call to the pt and no answer and voice mail not set up  

## 2019-02-26 NOTE — Telephone Encounter (Signed)
I spoke with the pt and she has been scheduled with T[facility] for 8/27 for diarrhea/ abd pain post ED visit.

## 2019-02-27 ENCOUNTER — Encounter: Payer: Self-pay | Admitting: Family Medicine

## 2019-02-27 ENCOUNTER — Ambulatory Visit: Payer: Medicare Other | Admitting: Psychiatry

## 2019-03-04 ENCOUNTER — Telehealth: Payer: Self-pay | Admitting: *Deleted

## 2019-03-04 ENCOUNTER — Other Ambulatory Visit: Payer: Self-pay

## 2019-03-04 ENCOUNTER — Other Ambulatory Visit: Payer: Medicare Other | Admitting: Licensed Clinical Social Worker

## 2019-03-04 ENCOUNTER — Other Ambulatory Visit: Payer: Medicare Other | Admitting: *Deleted

## 2019-03-04 DIAGNOSIS — Z515 Encounter for palliative care: Secondary | ICD-10-CM

## 2019-03-04 NOTE — Progress Notes (Signed)
COMMUNITY PALLIATIVE CARE SW NOTE  PATIENT NAME: Tracy Malone DOB: 1931-12-21 MRN: [id number]  PRIMARY CARE PROVIDER: [name], MD  RESPONSIBLE PARTY:  Acct ID - Guarantor Home Phone Work Phone Relationship Acct Type  [id number] Tracy [phone] Self P/F     [address] [name], Tracy Malone   Due to the COVID-19 crisis, this virtual check-in visit was done via telephone from my office and it was initiated and consent given by this patient and or family.  PLAN OF CARE and INTERVENTIONS:             1. GOALS OF CARE/ ADVANCE CARE PLANNING:  Goal is to remain at home with her husband, Tracy Malone.  Patient is a full code. 2. SOCIAL/EMOTIONAL/SPIRITUAL ASSESSMENT/ INTERVENTIONS:  SW and Palliative Care RN, [name], conducted a virtual check-in visit.  She sounded tearful and expressed her lifelong feelings of guilt.  Recommended counseling.  Provided active listening and supportive counseling while patient expressed her feelings of depression.  Assessed for self harm due to her helplessness and hopelessness.  Patient adamantly denied.  She is not sleeping well. 3. PATIENT/CAREGIVER EDUCATION/ COPING:  She expresses her feelings openly. 4. PERSONAL EMERGENCY PLAN:  She will tell her husband or family. 5. COMMUNITY RESOURCES COORDINATION/ HEALTH CARE NAVIGATION:  She has a Secretary/administrator. 6. FINANCIAL/LEGAL CONCERNS/INTERVENTIONS:  None.     SOCIAL HX:  Social History   Tobacco Use  . Smoking status: Never Smoker  . Smokeless tobacco: Never Used  Substance Use Topics  . Alcohol use: No    Alcohol/week: 0.0 standard drinks    CODE STATUS:  Full Code  ADVANCED DIRECTIVES: N MOST FORM COMPLETE:  N HOSPICE EDUCATION PROVIDED:  N PPS:  Patient reports a decrease in appetite.  Patient ambulates independently. Duration of visit and documentation:  60 minutes.      Creola Corn Sequoya Hogsett, LCSW

## 2019-03-04 NOTE — Telephone Encounter (Signed)
Contacted patient to arrange a date/time for a virtual visit. Patient requested visit be done today so is scheduled at 1:00 pm and husband Gwyndolyn Saxon at 2:00 pm.

## 2019-03-06 ENCOUNTER — Ambulatory Visit (INDEPENDENT_AMBULATORY_CARE_PROVIDER_SITE_OTHER): Payer: Medicare Other | Admitting: Family Medicine

## 2019-03-06 ENCOUNTER — Encounter: Payer: Self-pay | Admitting: Family Medicine

## 2019-03-06 ENCOUNTER — Other Ambulatory Visit: Payer: Self-pay

## 2019-03-06 VITALS — BP 120/70 | HR 72 | Temp 98.9°F | Resp 16 | Wt 105.0 lb

## 2019-03-06 DIAGNOSIS — F32A Depression, unspecified: Secondary | ICD-10-CM

## 2019-03-06 DIAGNOSIS — F419 Anxiety disorder, unspecified: Secondary | ICD-10-CM | POA: Diagnosis not present

## 2019-03-06 DIAGNOSIS — R350 Frequency of micturition: Secondary | ICD-10-CM

## 2019-03-06 DIAGNOSIS — F329 Major depressive disorder, single episode, unspecified: Secondary | ICD-10-CM

## 2019-03-06 DIAGNOSIS — I6521 Occlusion and stenosis of right carotid artery: Secondary | ICD-10-CM

## 2019-03-06 DIAGNOSIS — H8110 Benign paroxysmal vertigo, unspecified ear: Secondary | ICD-10-CM | POA: Diagnosis not present

## 2019-03-06 DIAGNOSIS — R1011 Right upper quadrant pain: Secondary | ICD-10-CM

## 2019-03-06 LAB — POCT URINALYSIS DIP (PROADVANTAGE DEVICE)
Bilirubin, UA: NEGATIVE
Blood, UA: NEGATIVE
Glucose, UA: NEGATIVE mg/dL
Ketones, POC UA: NEGATIVE mg/dL
Leukocytes, UA: NEGATIVE
Nitrite, UA: NEGATIVE
Protein Ur, POC: NEGATIVE mg/dL
Specific Gravity, Urine: 1
Urobilinogen, Ur: NEGATIVE
pH, UA: 6 (ref 5.0–8.0)

## 2019-03-06 NOTE — Progress Notes (Signed)
Chief Complaint  Patient presents with  . bladder soreness, burning    bladder soreness, burning, weak   She had vertigo when she woke up this morning--room felt like it was spinning, lasted 3 minutes.  Also had it about a month ago, gradually resolved.  She continues to complain of abdominal problems--she reports bladder soreness, as well as upper abdominal pain, extending across the upper stomach. She has decreased appetite. She describes a constant fullness in the upper stomach (not sharp as when she was seen for pain on 7/23).  Her husband is worried that she isn't eating much, feels bad, and is losing weight.  She has had two back to back bladder infections.  She previously was put on premarin cream by her GYN, Dr. Helane Rima, and had seen urologist.  She has not discussed the UTI's or potentially restarting premarin cream with her.  She was given some other samples of cream.  She hasn't tried using this, due to "not having any vaginal problems".  Her daughter suggested she could have a gall bladder problem.  She and husband state that she doesn't digest food well, stays in her stomach and she feels miserable. Stools are soft, thin, yellowish.  She had 3 stools today. No diarrhea since the first day of cipro.  Hasn't needed to take bentyl.  Describes a fullness on the left, like she has a balloon in there. Not having crampy pain.  PMH, PSH, SH reviewed  Outpatient Encounter Medications as of 03/06/2019  Medication Sig  . ALPRAZolam (XANAX) 0.5 MG tablet TAKE BY MOUTH UP TO 3 TIMES DAILY. WARNING: BENZODIAZEPINES INCREASE THE RISK OF FALLS AND NEUROCOGNITIVE DECLINE IN THE ELDERLY (Patient taking differently: Take 0.5 mg by mouth 4 (four) times daily as needed for anxiety. )  . Carbidopa-Levodopa ER (SINEMET CR) 25-100 MG tablet controlled release Take 1 tablet by mouth 4 (four) times daily. (Patient taking differently: Take 1 tablet by mouth 3 (three) times daily. )  . Cholecalciferol  (VITAMIN D) 2000 UNITS tablet Take 2,000 Units by mouth daily.  . Probiotic Product (ALIGN PO) Take 1 capsule by mouth daily.   Marland Kitchen SYNTHROID 25 MCG tablet TAKE 1 TABLET DAILY BEFORE BREAKFAST (Patient taking differently: Take 25 mcg by mouth daily before breakfast. )  . ciprofloxacin (CIPRO) 250 MG tablet Take 1 tablet (250 mg total) by mouth 2 (two) times daily. (Patient not taking: Reported on 03/06/2019)  . dicyclomine (BENTYL) 10 MG capsule Take 1 capsule (10 mg total) by mouth every 6 (six) hours as needed for spasms. (Patient not taking: Reported on 03/06/2019)  . metoprolol tartrate (LOPRESSOR) 25 MG tablet 1/2 to 1 po in the morning (Patient not taking: Reported on 03/06/2019)   No facility-administered encounter medications on file as of 03/06/2019.    Allergies  Allergen Reactions  . Iodine Anaphylaxis    IV and topical forms.  Burnard Leigh [Hyoscyamine Sulfate]     Vision problems/pt has glaucoma  . Salmon [Fish Allergy] Hives and Shortness Of Breath  . Shellfish Allergy Anaphylaxis  . Remeron [Mirtazapine] Other (See Comments)    Cause blurred vision and red eyes, pt has glaucoma  . Aspirin Other (See Comments)    Sever stomach pain due to ulcer scaring.  . Ciprofloxacin Diarrhea  . Codeine Nausea And Vomiting  . Darvocet [Propoxyphene N-Acetaminophen] Nausea And Vomiting  . Demerol  [Meperidine Hcl]   . Demerol [Meperidine] Nausea Only  . Dexilant [Dexlansoprazole] Swelling    Redness, swelling and peeling  of both feet.  . Diphedryl [Diphenhydramine] Other (See Comments)    Increased pulse/small amount ok  . Doxycycline Hyclate Other (See Comments)    GI intolerance.  Marland Kitchen Epinephrine Other (See Comments)    Breathing problems  . Erythromycin Other (See Comments)    GI intolerance.  Yvette Rack [Cyclobenzaprine] Other (See Comments)    Tingly/prickly sensation.  Marland Kitchen Keflex [Cephalexin] Hives  . Latex Other (See Comments)    Gloves ok.  Skin gets red from elastic in underwear and  latex bandaides.  . Nitrofurantoin Diarrhea  . Other   . Prednisone Other (See Comments)    Headache  . Pylera [Bis Subcit-Metronid-Tetracyc] Swelling    Tongue swelling. Face tingling  . Sulfa Antibiotics Other (See Comments)    Increased pulse, fainting, diarrhea, thrush  . Xylocaine [Lidocaine Hcl]     With epinephrine, given by dentist.  Speeded up heart rate and she passed out (occured twice, at dentist)  . Zoloft [Sertraline Hcl] Swelling and Other (See Comments)    Migraine Swelling of tongue/lip (09/2012)  . Advil [Ibuprofen] Other (See Comments)    Motrin ok with a GI effect.  . Clarithromycin Rash    Started after completing 10 day course of 2000 mg /day   ROS: No fever or chills.  No dysuria.  When she pushes herself up from a chair, to try and stand, she feels like there is a bubble, like she has to pee. Abdominal pain and decreased appetite per HPI. +depression. No nausea or vomiting   PHYSICAL EXAM:  BP 120/70   Pulse 72   Temp 98.9 F (37.2 C) (Temporal)   Resp 16   Wt 105 lb (47.6 kg)   SpO2 97%   BMI 21.21 kg/m   Wt Readings from Last 3 Encounters:  03/06/19 105 lb (47.6 kg)  02/25/19 101 lb 6.6 oz (46 kg)  02/20/19 105 lb 12.8 oz (48 kg)   Wt 108-109 in 08/2018, and also 02/2018  Thin, elderly female, appears frustrated, tired, and depressed. She does have full range of affect, and brightens when asking about my children HEENT: conjunctiva and sclera are clear, EOMI.  Wearing mask Neck: no lymphadenopathy or mass Heart: regular rate and rhythm Lungs: clear bilaterally Abdomen: soft, slightly diminished bowel sounds, but present and normal pitch.  nondistended and soft.  She is tender at RUQ. Negative Murphy sign, no hepatosplenomegaly.  No rebound or guarding. nontender elsewhere today. Back: no CVA tenderness Extremities: no edema Skin: normal turgor, no rash Neuro: she is alert and oriented.  EOMI. She had short-lived vertigo develop when arising to  seated position from supine with head straight forward.  Also when leaning forward slightly.  No vertigo when doing same maneuver with head turned to either side.  Urine dip normal  CT and US performed in ED earlier this month were reviewed with patient, normal gall bladder   ASSESSMENT/PLAN:  Urinary frequency - recent UTI x 2, completed ABX. UA normal today, reassured. Disc causes of recurrent UTI, to discuss with GYN poss restart premarin, vs seeing urologist - Plan: POCT Urinalysis DIP (Proadvantage Device)  RUQ pain - has diffuse bloating/pain per pt, only tender at RUQ. normal US/CT recently. Check LFT's to reassure pt - Plan: Hepatic function panel  Benign paroxysmal positional vertigo, unspecified laterality - counseled to turn head when changing positions, to avoid vertigo. If persists or worsens, discussed poss PT. Currently mild, short-lived   Anxiety and depression - discussed how this could  be a factor with her decreased appetite and pain complaints   Reassured that gall bladder looked normal on recent scans (which doesn't necessarily indicate function)--to f/u with GI if persistent/worsening pain. Will check LFT's to reassure pt that liver is fine, given location of pain.  She has lost a few pounds, but not steadily losing wt, husband reassured. Discussed decreased appetite (without nausea) could be symptom of her depression.  Recurrent UTI--urine is clear today. To discuss with her GYN. Discussed at length today causes of UTI--atrophy/postmenopausal, vs urinary retention related to position of bladder, etc.  Discussed evaluation that could be done by urologist, but she prefers to start with her GYN, whom she trusts.  Visit time today was 30 minutes, more than 1/2 spent counseling.   You have benign positional vertigo--the room spinning that you feel seems to be when you sit up when looking straight forward. To avoid feeling bad, try looking to either side when you sit up,  and that should help prevent the vertigo.   You may be having recurrent infections due to issues related to postmenopausal atrophy of the tissues.  Premarin cream has helped you in the past.  You may want to discuss this with Dr. Helane Rima.  Other things that may need to be evaluated is how well you empty your bladder (post-void residual), and you may need to go back to see a urologist for further evaluation.

## 2019-03-06 NOTE — Patient Instructions (Signed)
You have benign positional vertigo--the room spinning that you feel seems to be when you sit up when looking straight forward. To avoid feeling bad, try looking to either side when you sit up, and that should help prevent the vertigo.   You may be having recurrent infections due to issues related to postmenopausal atrophy of the tissues.  Premarin cream has helped you in the past.  You may want to discuss this with Dr. Helane Rima.  Other things that may need to be evaluated is how well you empty your bladder (post-void residual), and you may need to go back to see a urologist for further evaluation.   Benign Positional Vertigo Vertigo is the feeling that you or your surroundings are moving when they are not. Benign positional vertigo is the most common form of vertigo. This is usually a harmless condition (benign). This condition is positional. This means that symptoms are triggered by certain movements and positions. This condition can be dangerous if it occurs while you are doing something that could cause harm to you or others. This includes activities such as driving or operating machinery. What are the causes? In many cases, the cause of this condition is not known. It may be caused by a disturbance in an area of the inner ear that helps your brain to sense movement and balance. This disturbance can be caused by:  Viral infection (labyrinthitis).  Head injury.  Repetitive motion, such as jumping, dancing, or running. What increases the risk? You are more likely to develop this condition if:  You are a woman.  You are 83 years of age or older. What are the signs or symptoms? Symptoms of this condition usually happen when you move your head or your eyes in different directions. Symptoms may start suddenly, and usually last for less than a minute. They include:  Loss of balance and falling.  Feeling like you are spinning or moving.  Feeling like your surroundings are spinning or moving.   Nausea and vomiting.  Blurred vision.  Dizziness.  Involuntary eye movement (nystagmus). Symptoms can be mild and cause only minor problems, or they can be severe and interfere with daily life. Episodes of benign positional vertigo may return (recur) over time. Symptoms may improve over time. How is this diagnosed? This condition may be diagnosed based on:  Your medical history.  Physical exam of the head, neck, and ears.  Tests, such as: ? MRI. ? CT scan. ? Eye movement tests. Your health care provider may ask you to change positions quickly while he or she watches you for symptoms of benign positional vertigo, such as nystagmus. Eye movement may be tested with a variety of exams that are designed to evaluate or stimulate vertigo. ? An electroencephalogram (EEG). This records electrical activity in your brain. ? Hearing tests. You may be referred to a health care provider who specializes in ear, nose, and throat (ENT) problems (otolaryngologist) or a provider who specializes in disorders of the nervous system (neurologist). How is this treated?  This condition may be treated in a session in which your health care provider moves your head in specific positions to adjust your inner ear back to normal. Treatment for this condition may take several sessions. Surgery may be needed in severe cases, but this is rare. In some cases, benign positional vertigo may resolve on its own in 2-4 weeks. Follow these instructions at home: Safety  Move slowly. Avoid sudden body or head movements or certain positions, as told  by your health care provider.  Avoid driving until your health care provider says it is safe for you to do so.  Avoid operating heavy machinery until your health care provider says it is safe for you to do so.  Avoid doing any tasks that would be dangerous to you or others if vertigo occurs.  If you have trouble walking or keeping your balance, try using a cane for  stability. If you feel dizzy or unstable, sit down right away.  Return to your normal activities as told by your health care provider. Ask your health care provider what activities are safe for you. General instructions  Take over-the-counter and prescription medicines only as told by your health care provider.  Drink enough fluid to keep your urine pale yellow.  Keep all follow-up visits as told by your health care provider. This is important. Contact a health care provider if:  You have a fever.  Your condition gets worse or you develop new symptoms.  Your family or friends notice any behavioral changes.  You have nausea or vomiting that gets worse.  You have numbness or a "pins and needles" sensation. Get help right away if you:  Have difficulty speaking or moving.  Are always dizzy.  Faint.  Develop severe headaches.  Have weakness in your legs or arms.  Have changes in your hearing or vision.  Develop a stiff neck.  Develop sensitivity to light. Summary  Vertigo is the feeling that you or your surroundings are moving when they are not. Benign positional vertigo is the most common form of vertigo.  The cause of this condition is not known. It may be caused by a disturbance in an area of the inner ear that helps your brain to sense movement and balance.  Symptoms include loss of balance and falling, feeling that you or your surroundings are moving, nausea and vomiting, and blurred vision.  This condition can be diagnosed based on symptoms, physical exam, and other tests, such as MRI, CT scan, eye movement tests, and hearing tests.  Follow safety instructions as told by your health care provider. You will also be told when to contact your health care provider in case of problems. This information is not intended to replace advice given to you by your health care provider. Make sure you discuss any questions you have with your health care provider. Document  Released: 04/17/2006 Document Revised: 12/19/2017 Document Reviewed: 12/19/2017 Elsevier Patient Education  2020 [employer].

## 2019-03-07 LAB — HEPATIC FUNCTION PANEL
ALT: 7 IU/L (ref 0–32)
AST: 21 IU/L (ref 0–40)
Albumin: 4.2 g/dL (ref 3.6–4.6)
Alkaline Phosphatase: 86 IU/L (ref 39–117)
Bilirubin Total: 0.7 mg/dL (ref 0.0–1.2)
Bilirubin, Direct: 0.17 mg/dL (ref 0.00–0.40)
Total Protein: 6.7 g/dL (ref 6.0–8.5)

## 2019-03-08 NOTE — Progress Notes (Signed)
COMMUNITY PALLIATIVE CARE RN NOTE  PATIENT NAME: Tracy Malone DOB: April 22, 1932 MRN: [id number]  PRIMARY CARE PROVIDER: [name], MD  RESPONSIBLE PARTY:  Acct ID - Guarantor Home Phone Work Phone Relationship Acct Type  [id number] [name] Self P/F     [address], [place],  [address]   Due to the COVID-19 crisis, this virtual check-in visit was done via telephone from my office and it was initiated and consent by this patient and or family.  PLAN OF CARE and INTERVENTION:  1. ADVANCE CARE PLANNING/GOALS OF CARE: Goal is for patient to remain at home with her husband. She wishes for her crying spells to decrease. 2. PATIENT/CAREGIVER EDUCATION: N/A 3. DISEASE STATUS: Joint virtual check-in visit completed with Palliative care SW, Lynn Duffy. Patient reports that she was recently seen in the ED for severe abdominal pain. She was treated for a UTI in July with Macrobid, but was switched to Cipro due to the organism growing. Ultrasound and Abdominal CT did not show any acute abnormalities. They feel that her symptoms could be related to an ulcer. Her symptoms were improved with Maalox, Protonix and Pepcid. She says that she is currently on Cipro for a UTI and has one more dose  left but does not like taking d/t side effects of increased abdominal pain and bloating. She is feeling more tired, emotional and agitated lately. She was quite tearful throughout phone conversation. She reports feelings of guilt resurfacing that began when she was a child. It is difficult for her to redirect her thoughts. She has a Social worker that she speaks with every other week. Recommended that she request weekly sessions for a while. Her intake is decreasing and she says she is losing weight. She is no longer drinking Ensure, as she says it started causing diarrhea. Recommended that she try Boost Breeze since it is more juice based. She reports not sleeping well and woke up this am  feeling dizzy. Unsure of cause. Her constipation has improved on current regimen. Will continue to monitor.   HISTORY OF PRESENT ILLNESS: This is a 85 yo female who resides at home with her husband. Palliative care team continues to follow patient. Will continue to visit patient monthly and prn.   CODE STATUS: Full Code ADVANCED DIRECTIVES: Y MOST FORM: no PPS: 50%   (Duration of visit and documentation 75 minutes)   [name], RN BSN

## 2019-03-11 ENCOUNTER — Ambulatory Visit (INDEPENDENT_AMBULATORY_CARE_PROVIDER_SITE_OTHER): Payer: Medicare Other | Admitting: Psychiatry

## 2019-03-11 ENCOUNTER — Ambulatory Visit (INDEPENDENT_AMBULATORY_CARE_PROVIDER_SITE_OTHER): Payer: Medicare Other | Admitting: Family Medicine

## 2019-03-11 ENCOUNTER — Other Ambulatory Visit: Payer: Self-pay

## 2019-03-11 VITALS — BP 118/70 | HR 83 | Temp 97.1°F

## 2019-03-11 DIAGNOSIS — F330 Major depressive disorder, recurrent, mild: Secondary | ICD-10-CM

## 2019-03-11 DIAGNOSIS — F411 Generalized anxiety disorder: Secondary | ICD-10-CM

## 2019-03-11 DIAGNOSIS — I6521 Occlusion and stenosis of right carotid artery: Secondary | ICD-10-CM

## 2019-03-11 DIAGNOSIS — T148XXA Other injury of unspecified body region, initial encounter: Secondary | ICD-10-CM | POA: Diagnosis not present

## 2019-03-11 DIAGNOSIS — B079 Viral wart, unspecified: Secondary | ICD-10-CM

## 2019-03-11 MED ORDER — DICLOFENAC SODIUM 75 MG PO TBEC: 75.0000 mg | DELAYED_RELEASE_TABLET | Freq: Two times a day (BID) | ORAL | 0 refills | Status: DC

## 2019-03-11 NOTE — Progress Notes (Signed)
   Subjective:    Patient ID: Tracy Malone, female    DOB: 1932/02/22, 85 y.o.   MRN: [id number]  HPI She states that last Saturday while she was moving a piece of furniture she was pushing that and then felt some lower abdominal and pelvic pain.  She has been treating this with off and on ice and heat and is also tried Tylenol and Motrin without success.  She was recently treated for a UTI and states she is still having some frequency.  She also has a lesion on her anterior chest that she would like evaluated.   Review of Systems     Objective:   Physical Exam Alert and complaining of abdominal/pelvic discomfort.  She does complain of palpable tenderness in the right gluteus minimus area.  No tenderness over SI joint or over iliac crest.  No skin lesions noted.  Abdominal exam shows no tenderness palpation in the lateral right abdominal area. Exam of the anterior chest does show a 0.5 cm raised pinkish lesion.       Assessment & Plan:  Muscle strain - Plan: diclofenac (VOLTAREN) 75 MG EC tablet, I did review her allergy history and she has never had this medication before.  She did try ibuprofen without success so not having a true allergy to that.  Also recommend Tylenol.  Viral warts, unspecified type - Plan: Recommend she come back and have Dr. Tomi Bamberger remove the wart from her anterior chest.  Flu shot offered but she refused

## 2019-03-11 NOTE — Patient Instructions (Signed)
Take the Voltaren twice per day and you can also take Tylenol with that.  Set up an appointment to see Dr. Tomi Bamberger about the lesion on your chest

## 2019-03-12 ENCOUNTER — Telehealth: Payer: Self-pay

## 2019-03-12 DIAGNOSIS — M4856XA Collapsed vertebra, not elsewhere classified, lumbar region, initial encounter for fracture: Secondary | ICD-10-CM | POA: Diagnosis not present

## 2019-03-12 DIAGNOSIS — M545 Low back pain: Secondary | ICD-10-CM | POA: Diagnosis not present

## 2019-03-12 DIAGNOSIS — M810 Age-related osteoporosis without current pathological fracture: Secondary | ICD-10-CM | POA: Diagnosis not present

## 2019-03-12 NOTE — Telephone Encounter (Signed)
It is usually best to get bone density the same place prior one was done, so they can compare.  Her last DEXA was done 2016 at [facility], ordered by Dr. Lenna Gilford.  She might want to contact Dr. Lenna Gilford.  There is no rush that I'm aware of for this test--it will not necessarily alter treatment (I don't have any records from ortho, but if it was a compression fracture, that DEFINES that she has osteoporosis), and it won't help her feel better any faster.

## 2019-03-12 NOTE — Telephone Encounter (Signed)
Pt was seen by Dr. Marlou Sa and she has a fracture in her mid back . Pt was advised to have a dexa scan. She would like to have it as soon as she can but stated pain is still a ten. Please advise if pt should wait or can go ahead and have the dexa scan. Also pt will need order for the dexa scan to be done at the breast center. [place]

## 2019-03-12 NOTE — Telephone Encounter (Signed)
Patient advised.

## 2019-03-13 ENCOUNTER — Other Ambulatory Visit: Payer: Self-pay

## 2019-03-13 ENCOUNTER — Other Ambulatory Visit: Payer: Medicare Other | Admitting: Licensed Clinical Social Worker

## 2019-03-13 DIAGNOSIS — Z515 Encounter for palliative care: Secondary | ICD-10-CM

## 2019-03-13 NOTE — Progress Notes (Signed)
COMMUNITY PALLIATIVE CARE SW NOTE  PATIENT NAME: TZIVIA ONEIL DOB: Jan 07, 1932 MRN: [id number]  PRIMARY CARE PROVIDER: [name], MD  RESPONSIBLE PARTY:  Acct ID - Guarantor Home Phone Work Phone Relationship Acct Type  [id number] [name] Self P/F     [address] [place], [place] [address]   Due to the COVID-19 crisis, this virtual check-in visit was done via telephone from my office and it was initiated and consent given by this patient and or family.  PLAN OF CARE and INTERVENTIONS:             1. GOALS OF CARE/ ADVANCE CARE PLANNING:  Patient wishes to remain at home with her husband, [name].  She is a full code. 2. SOCIAL/EMOTIONAL/SPIRITUAL ASSESSMENT/ INTERVENTIONS:  SW conducted a virtual check-in visit with patient in her home.  She stated she strained her back by moving her mattress.  She was tearful at times, but less than previous visit.  SW provided active listening and supportive counseling.  A church member made a visit yesterday, which she benefited from.  She confirms feeling depressed.  She remains in contact with her son. 3. PATIENT/CAREGIVER EDUCATION/ COPING:  Patient copes by expressing her feelings openly, but feels no one understands her. 4. PERSONAL EMERGENCY PLAN:  She informs her family if she has a medical need. 5. COMMUNITY RESOURCES COORDINATION/ HEALTH CARE NAVIGATION:  She has assistance for house keeping. 6. FINANCIAL/LEGAL CONCERNS/INTERVENTIONS:  None.     SOCIAL HX:  Social History   Tobacco Use  . Smoking status: Never Smoker  . Smokeless tobacco: Never Used  Substance Use Topics  . Alcohol use: No    Alcohol/week: 0.0 standard drinks    CODE STATUS:  Full Code  ADVANCED DIRECTIVES: N MOST FORM COMPLETE:  N HOSPICE EDUCATION PROVIDED:  N PPS:  Patient reports a decreased appetite.  She ambulates independently. Duration of visit and documentation:  45 minutes.      Creola Corn Maecyn Panning, LCSW

## 2019-03-14 ENCOUNTER — Telehealth: Payer: Self-pay | Admitting: Gastroenterology

## 2019-03-14 NOTE — Telephone Encounter (Signed)
The pt is complaining of  abd discomfort, bloating.  She has a recent history of diarrhea.  Took bentyl and imodium.  She is now constipated and has been taking colace BID as well as miralax BID. She stopped the bentyl.   She says she is not eating very much at all.  Some cereal in the morning, soft foods for lunch and ensure once daily.  She has not had a BM in 3 days.  Passes a lot of gas.  Please advise

## 2019-03-16 ENCOUNTER — Encounter: Payer: Self-pay | Admitting: Family Medicine

## 2019-03-16 DIAGNOSIS — S32010A Wedge compression fracture of first lumbar vertebra, initial encounter for closed fracture: Secondary | ICD-10-CM | POA: Insufficient documentation

## 2019-03-16 DIAGNOSIS — M810 Age-related osteoporosis without current pathological fracture: Secondary | ICD-10-CM | POA: Insufficient documentation

## 2019-03-16 NOTE — Telephone Encounter (Signed)
Advise 3 fleet enemas.  Thanks

## 2019-03-17 NOTE — Telephone Encounter (Signed)
The pt states she took several more doses of miralax over the weekend and did have a good BM.  She will get some fleet enemas to keep on hand if this happens in the future.

## 2019-03-20 ENCOUNTER — Ambulatory Visit: Payer: Medicare Other | Admitting: Nurse Practitioner

## 2019-03-20 DIAGNOSIS — M545 Low back pain: Secondary | ICD-10-CM | POA: Diagnosis not present

## 2019-03-21 ENCOUNTER — Other Ambulatory Visit: Payer: Self-pay

## 2019-03-21 ENCOUNTER — Other Ambulatory Visit: Payer: Medicare Other | Admitting: *Deleted

## 2019-03-21 ENCOUNTER — Other Ambulatory Visit: Payer: Medicare Other | Admitting: Licensed Clinical Social Worker

## 2019-03-21 DIAGNOSIS — Z515 Encounter for palliative care: Secondary | ICD-10-CM | POA: Diagnosis not present

## 2019-03-21 NOTE — Progress Notes (Signed)
COMMUNITY PALLIATIVE CARE SW NOTE  PATIENT NAME: Tracy Malone DOB: [birth year] MRN: [id number]  PRIMARY CARE PROVIDER: [name], MD  RESPONSIBLE PARTY:  Acct ID - Guarantor Home Phone Work Phone Relationship Acct Type  [id number] Tracy [phone] Self P/F     [address], [place], [place] [address]   Due to the COVID-19 crisis, this virtual check-in visit was done via telephone from my office and it was initiated and consent given by this patient and or family.  PLAN OF CARE and INTERVENTIONS:             1. GOALS OF CARE/ ADVANCE CARE PLANNING:  Goal is for patient to remain in her home with her husband, Tracy Malone.  Patient is a full code. 2. SOCIAL/EMOTIONAL/SPIRITUAL ASSESSMENT/ INTERVENTIONS:  SW and Palliative Care RN, Tracy Malone, conducted a virtual check-in visit with patient.  She stated she has to have back surgery and was in severe pain.  She reports this is due to moving a mattress and will have to have cement in her back.  She did not sound tearful during the visit.  She is having difficulty finding a comfortable position to sleep in.  She asked for a call back next week so that she can update the Palliative Care team about her surgery. 3. PATIENT/CAREGIVER EDUCATION/ COPING:  Patient expresses her feelings openly. 4. PERSONAL EMERGENCY PLAN:  Patient will inform her MD. 5. COMMUNITY RESOURCES COORDINATION/ HEALTH CARE NAVIGATION:  Patient has assistance with housekeeping. 6. FINANCIAL/LEGAL CONCERNS/INTERVENTIONS:  None.     SOCIAL HX:  Social History   Tobacco Use  . Smoking status: Never Smoker  . Smokeless tobacco: Never Used  Substance Use Topics  . Alcohol use: No    Alcohol/week: 0.0 standard drinks    CODE STATUS:  Full Code ADVANCED DIRECTIVES: No MOST FORM COMPLETE:  No HOSPICE EDUCATION PROVIDED:  No PPS:  Patient reports a decrease in her appetite.  She ambulates independently. Duration of visit and documentation:  45  minutes.      Tracy Malone Tracy Chrismer, LCSW

## 2019-03-24 ENCOUNTER — Telehealth: Payer: Self-pay | Admitting: Gastroenterology

## 2019-03-24 NOTE — Telephone Encounter (Signed)
The pt was scheduled to see Dr Ardis Hughs on 9/2 but wanted to be seen sooner and cancelled the appt with Dr Ardis Hughs rescheduled with Nevin Bloodgood on 8/27. She then  no showed the appt with Nevin Bloodgood on 8/27.  She is calling to get back on the schedule with Dr Ardis Hughs for 9/2.  There are no appointments available for 9/2 with Dr Ardis Hughs so she was offered an appt on 10/8 with Ellouise Newer PA.  The pt states she does not want to see a NP. The pt has been advised of the information and verbalized understanding.

## 2019-03-25 ENCOUNTER — Encounter: Payer: Self-pay | Admitting: Family Medicine

## 2019-03-25 ENCOUNTER — Telehealth: Payer: Self-pay | Admitting: Family Medicine

## 2019-03-25 DIAGNOSIS — M4856XA Collapsed vertebra, not elsewhere classified, lumbar region, initial encounter for fracture: Secondary | ICD-10-CM | POA: Diagnosis not present

## 2019-03-25 NOTE — Telephone Encounter (Signed)
Pt dropped of surgery clearance form. Sending back to be completed.

## 2019-03-25 NOTE — Progress Notes (Signed)
COMMUNITY PALLIATIVE CARE RN NOTE  PATIENT NAME: Tracy Malone DOB: 1932-01-10 MRN: [id number]  PRIMARY CARE PROVIDER: [name], MD  RESPONSIBLE PARTY: Tracy Malone (husband) Acct ID - Guarantor Home Phone Work Phone Relationship Acct Type  [id number] TAMECA, [phone] Self P/F     [address], [place], [place] [address]   Due to the COVID-19 crisis, this virtual check-in visit was done via telephone from my office and it was initiated and consent by this patient and or family.  PLAN OF CARE and INTERVENTION:  1. ADVANCE CARE PLANNING/GOALS OF CARE: Goal is for patient to remain at home with her husband. She is a Full Code. 2. PATIENT/CAREGIVER EDUCATION: Pain Management, Safe Mobility 3. DISEASE STATUS:  Joint virtual check-in visit completed with Palliative Care SW, Lynn Duffy. Patient reports pain in her back and bilateral legs. She recently hurt her back moving a mattress. She was seen by an Orthopedic MD and was told that she now requires back surgery. She has an appointment next week to discuss the procedure and schedule the surgery. She is having difficulties sleeping and getting comfortable lying down. She has a pain medication via nose spray that is being ordered to help with pain, as she does not want to take any opioids. She remains ambulatory without assistive devices, but has to walk very slowly. No recent falls. Her intake has decreased. She reports not having an appetite. She does want a call next week to update her regarding her surgery. Will continue to monitor.                      HISTORY OF PRESENT ILLNESS:  This is a 85 yo female who resides at home with her husband. Palliative care team continues to follow patient. Will contact patient next week to discuss info regarding future back surgery.   CODE STATUS: Full Code ADVANCED DIRECTIVES: Y MOST FORM: no PPS: 50%   (Duration of visit and documentation 45 minutes)   [name], RN  BSN

## 2019-03-26 ENCOUNTER — Encounter: Payer: Self-pay | Admitting: Family Medicine

## 2019-03-26 ENCOUNTER — Ambulatory Visit: Payer: Self-pay | Admitting: Orthopedic Surgery

## 2019-03-26 ENCOUNTER — Ambulatory Visit: Payer: Medicare Other | Admitting: Gastroenterology

## 2019-03-26 NOTE — Telephone Encounter (Signed)
Patient advised.

## 2019-03-26 NOTE — Telephone Encounter (Signed)
Chart reviewed--cleared (but to stop diclofenac, if still taking, was rx'd 8/18 by JCL).  BP's/lipids ok, last ekg 07/2017, prior cardiac eval 2013-14 (PVC's)  FFO---in outgoing red folder.  Please return. Cleared for surgery

## 2019-03-27 ENCOUNTER — Ambulatory Visit: Payer: Self-pay | Admitting: Neurology

## 2019-03-28 ENCOUNTER — Telehealth: Payer: Self-pay | Admitting: Neurology

## 2019-03-28 NOTE — Telephone Encounter (Signed)
Is this okay to take?

## 2019-03-28 NOTE — Telephone Encounter (Signed)
Yes she needs to take it.  No it doesn't interfere with the surgery

## 2019-03-28 NOTE — Telephone Encounter (Signed)
Patient notified

## 2019-03-28 NOTE — Telephone Encounter (Signed)
Patient is having back surgery on 04/03/19 and he was wanting to know about the carbidopa levodopa medication. Would the medication interfere with the surgery? Does she still need to take it? Please call back and update husband. Thanks!

## 2019-04-01 ENCOUNTER — Other Ambulatory Visit (HOSPITAL_COMMUNITY)
Admission: RE | Admit: 2019-04-01 | Discharge: 2019-04-01 | Disposition: A | Discharge status: Home / Self Care | Payer: Medicare Other | Source: Ambulatory Visit | Attending: Orthopedic Surgery | Admitting: Orthopedic Surgery

## 2019-04-01 ENCOUNTER — Ambulatory Visit: Payer: Medicare Other | Admitting: Physician Assistant

## 2019-04-01 ENCOUNTER — Ambulatory Visit: Payer: Self-pay | Admitting: Orthopedic Surgery

## 2019-04-01 DIAGNOSIS — Z20828 Contact with and (suspected) exposure to other viral communicable diseases: Secondary | ICD-10-CM | POA: Insufficient documentation

## 2019-04-01 DIAGNOSIS — Z01812 Encounter for preprocedural laboratory examination: Secondary | ICD-10-CM | POA: Diagnosis not present

## 2019-04-01 LAB — SARS CORONAVIRUS 2 (TAT 6-24 HRS): SARS Coronavirus 2: NEGATIVE

## 2019-04-01 NOTE — Pre-Procedure Instructions (Signed)
Tracy Malone  04/01/2019     Your procedure is scheduled on Thursday, September 10..  Report to [facility], Main Entrance or Entrance "A" at 11:15 AM                   Your surgery or procedure is scheduled for 1:15 PM    Call this number if you have problems the morning of surgery: [phone]  This is the number for the Pre- Surgical Desk.   Remember:  Do not eat or drink after midnight.    Take these medicines the morning of surgery with A SIP OF WATER :              Carbidopa-Levodopa ER (SINEMET CR)    SYNTHROID    Take if needed: Acetaminophen (Tylenol) Alprazolam (Xanax)  STOP/ Do Not start taking Aspirin, Aspirin Products (Goody Powder, Excedrin Migraine), Ibuprofen (Advil), Naproxen (Aleve), Vitamins and Herbal Products (ie Fish Oil).  Special instructions:   [employer]- Preparing For Surgery  Before surgery, you can play an important role. Because skin is not sterile, your skin needs to be as free of germs as possible. You can reduce the number of germs on your skin by washing with CHG (chlorahexidine gluconate) Soap before surgery.  CHG is an antiseptic cleaner which kills germs and bonds with the skin to continue killing germs even after washing.    Oral Hygiene is also important to reduce your risk of infection.  Remember - BRUSH YOUR TEETH THE MORNING OF SURGERY WITH YOUR REGULAR TOOTHPASTE  Please do not use if you have an allergy to CHG or antibacterial soaps. If your skin becomes reddened/irritated stop using the CHG.  Do not shave (including legs and underarms) for at least 48 hours prior to first CHG shower. It is OK to shave your face.  Please follow these instructions carefully.   1. Shower the NIGHT BEFORE SURGERY and the MORNING OF SURGERY with CHG.   2. If you chose to wash your hair, wash your hair first as usual with your normal shampoo.  3. After you shampoo, wash your face and private area with the soap you use at home, then rinse  your hair and body thoroughly to remove the shampoo and soap.  4. Use CHG as you would any other liquid soap. You can apply CHG directly to the skin and wash gently with a scrungie or a clean washcloth.   5. Apply the CHG Soap to your body ONLY FROM THE NECK DOWN.  Do not use on open wounds or open sores. Avoid contact with your eyes, ears, mouth and genitals (private parts).   6. Wash thoroughly, paying special attention to the area where your surgery will be performed.  7. Thoroughly rinse your body with warm water from the neck down.  8. DO NOT shower/wash with your normal soap after using and rinsing off the CHG Soap.  9. Pat yourself dry with a CLEAN TOWEL.  10. Wear CLEAN PAJAMAS to bed the night before surgery, wear comfortable clothes the morning of surgery  11. Place CLEAN SHEETS on your bed the night of your first shower and DO NOT SLEEP WITH PETS.  Day of Surgery: Shower as instructed above. Do not wear lotions, powders, or perfumes, or deodorant. Please wear clean clothes to the hospital/surgery center.   Remember to brush your teeth WITH YOUR REGULAR TOOTHPASTE.  Do not wear jewelry, make-up or nail polish.  Do not  shave 48 hours prior to surgery.   Do not bring valuables to the hospital.  [facility] is not responsible for any belongings or valuables.  Contacts, dentures or bridgework may not be worn into surgery.  Leave your suitcase in the car.  After surgery it may be brought to your room.  For patients admitted to the hospital, discharge time will be determined by your treatment team.  Patients discharged the day of surgery will not be allowed to drive home.   Please read over the following fact sheets that you were given: Pain Booklet, Coughing and Deep Breathing, Surgical Site Infections.

## 2019-04-02 ENCOUNTER — Other Ambulatory Visit: Payer: Self-pay

## 2019-04-02 ENCOUNTER — Ambulatory Visit: Payer: Self-pay | Admitting: Orthopedic Surgery

## 2019-04-02 ENCOUNTER — Encounter (HOSPITAL_COMMUNITY): Payer: Self-pay

## 2019-04-02 ENCOUNTER — Ambulatory Visit (HOSPITAL_COMMUNITY)
Admission: RE | Admit: 2019-04-02 | Discharge: 2019-04-02 | Disposition: A | Discharge status: Home / Self Care | Payer: Medicare Other | Source: Ambulatory Visit | Attending: Orthopedic Surgery | Admitting: Orthopedic Surgery

## 2019-04-02 ENCOUNTER — Encounter (HOSPITAL_COMMUNITY)
Admission: RE | Admit: 2019-04-02 | Discharge: 2019-04-02 | Disposition: A | Discharge status: Home / Self Care | Payer: Medicare Other | Source: Ambulatory Visit | Attending: Orthopedic Surgery | Admitting: Orthopedic Surgery

## 2019-04-02 DIAGNOSIS — Z87440 Personal history of urinary (tract) infections: Secondary | ICD-10-CM | POA: Insufficient documentation

## 2019-04-02 DIAGNOSIS — K219 Gastro-esophageal reflux disease without esophagitis: Secondary | ICD-10-CM | POA: Diagnosis not present

## 2019-04-02 DIAGNOSIS — K589 Irritable bowel syndrome without diarrhea: Secondary | ICD-10-CM | POA: Insufficient documentation

## 2019-04-02 DIAGNOSIS — E890 Postprocedural hypothyroidism: Secondary | ICD-10-CM | POA: Diagnosis not present

## 2019-04-02 DIAGNOSIS — F329 Major depressive disorder, single episode, unspecified: Secondary | ICD-10-CM | POA: Diagnosis not present

## 2019-04-02 DIAGNOSIS — M797 Fibromyalgia: Secondary | ICD-10-CM | POA: Diagnosis not present

## 2019-04-02 DIAGNOSIS — G20 Parkinson's disease: Secondary | ICD-10-CM | POA: Diagnosis not present

## 2019-04-02 DIAGNOSIS — Z8672 Personal history of thrombophlebitis: Secondary | ICD-10-CM | POA: Insufficient documentation

## 2019-04-02 DIAGNOSIS — Z79890 Hormone replacement therapy (postmenopausal): Secondary | ICD-10-CM | POA: Diagnosis not present

## 2019-04-02 DIAGNOSIS — M8008XA Age-related osteoporosis with current pathological fracture, vertebra(e), initial encounter for fracture: Secondary | ICD-10-CM | POA: Diagnosis not present

## 2019-04-02 DIAGNOSIS — S32010A Wedge compression fracture of first lumbar vertebra, initial encounter for closed fracture: Secondary | ICD-10-CM | POA: Diagnosis not present

## 2019-04-02 DIAGNOSIS — F410 Panic disorder [episodic paroxysmal anxiety] without agoraphobia: Secondary | ICD-10-CM | POA: Insufficient documentation

## 2019-04-02 DIAGNOSIS — F40240 Claustrophobia: Secondary | ICD-10-CM | POA: Diagnosis not present

## 2019-04-02 DIAGNOSIS — Z01818 Encounter for other preprocedural examination: Secondary | ICD-10-CM | POA: Diagnosis not present

## 2019-04-02 DIAGNOSIS — Z79899 Other long term (current) drug therapy: Secondary | ICD-10-CM | POA: Diagnosis not present

## 2019-04-02 HISTORY — DX: Parkinson's disease without dyskinesia, without mention of fluctuations: G20.A1

## 2019-04-02 HISTORY — DX: Parkinson's disease: G20

## 2019-04-02 LAB — CBC
HCT: 46.9 % — ABNORMAL HIGH (ref 36.0–46.0)
Hemoglobin: 14.8 g/dL (ref 12.0–15.0)
MCH: 29.8 pg (ref 26.0–34.0)
MCHC: 31.6 g/dL (ref 30.0–36.0)
MCV: 94.4 fL (ref 80.0–100.0)
Platelets: 313 10*3/uL (ref 150–400)
RBC: 4.97 MIL/uL (ref 3.87–5.11)
RDW: 14 % (ref 11.5–15.5)
WBC: 10.4 10*3/uL (ref 4.0–10.5)
nRBC: 0 % (ref 0.0–0.2)

## 2019-04-02 LAB — URINALYSIS, ROUTINE W REFLEX MICROSCOPIC
Bacteria, UA: NONE SEEN
Bilirubin Urine: NEGATIVE
Glucose, UA: NEGATIVE mg/dL
Ketones, ur: NEGATIVE mg/dL
Leukocytes,Ua: NEGATIVE
Nitrite: NEGATIVE
Protein, ur: NEGATIVE mg/dL
Specific Gravity, Urine: 1.006 (ref 1.005–1.030)
pH: 5 (ref 5.0–8.0)

## 2019-04-02 LAB — PROTIME-INR
INR: 1 (ref 0.8–1.2)
Prothrombin Time: 12.9 seconds (ref 11.4–15.2)

## 2019-04-02 LAB — BASIC METABOLIC PANEL
Anion gap: 8 (ref 5–15)
BUN: 9 mg/dL (ref 8–23)
CO2: 27 mmol/L (ref 22–32)
Calcium: 9.4 mg/dL (ref 8.9–10.3)
Chloride: 101 mmol/L (ref 98–111)
Creatinine, Ser: 0.51 mg/dL (ref 0.44–1.00)
GFR calc Af Amer: >60 mL/min (ref >60)
GFR calc non Af Amer: >60 mL/min (ref >60)
Glucose, Bld: 112 mg/dL — ABNORMAL HIGH (ref 70–99)
Potassium: 3.7 mmol/L (ref 3.5–5.1)
Sodium: 136 mmol/L (ref 135–145)

## 2019-04-02 LAB — SURGICAL PCR SCREEN
MRSA, PCR: NEGATIVE
Staphylococcus aureus: NEGATIVE

## 2019-04-02 LAB — APTT: aPTT: 29 seconds (ref 24–36)

## 2019-04-02 NOTE — Progress Notes (Signed)
Patient denies shortness of breath, fever, cough and chest pain at PAT appointment  PCP - Dr [name] Cardiologist - Denies  Chest x-ray - 04/02/19 EKG - 04/02/19 Stress Test - Denies ECHO - 05/22/12 Cardiac Cath - Denies  Anesthesia review: Yes  7 days prior to surgery STOP taking any Aspirin (unless otherwise instructed by your surgeon), Aleve, Naproxen, Ibuprofen, Motrin, Advil, Goody's, BC's, all herbal medications, fish oil, and all vitamins.   Coronavirus Screening Have you or your husband experienced the following symptoms:  Cough yes/no: No Fever (>100.81F)  yes/no: No Runny nose yes/no: No Sore throat yes/no: No Difficulty breathing/shortness of breath  yes/no: No  Have you or your husband traveled in the last 14 days and where? yes/no: No  Patient verbalized understanding of instructions that were given to them at the PAT appointment.

## 2019-04-02 NOTE — Progress Notes (Signed)
Anesthesia Chart Review:  Case: [id number] Date/Time: 04/03/19 1300   Procedure: KYPHOPLASTY L1 (N/A ) - 90 mins   Anesthesia type: General   Pre-op diagnosis: L1 Osteoporitic compression fracture   Location: [facility] OR ROOM 04 / [facility]   Surgeon: Melina Schools, MD      DISCUSSION: Patient is an 85 year old female scheduled for the above procedure.  History includes never smoker, Parkinson's disease, claustrophobia, panic attacks, depression, fibromyalgia, glaucoma, hypothyroidism, GERD, RLE superficial thrombophlebitis (2010), Bell's Palsy (1966, right face), carotid artery disease (mild, 2013, intolerant to simvastatin), cystocele with recurrent UTI, PVC's (2014), hiatal hernia, ischemic colitis (11/21/18-11/25/18 hospitalization), IBS, left thyroidectomy (2007; pathology: cystic adenomatous nodule a/w chronic lymphocytic thyroiditis). Patient says she is not aware of any CAD history and no mention of CAD in 2014 cardiology evaluation.   Reviewed EKG with anesthesiologist Renold Don, MD. R wave progression worse when compared to 2019 tracing. No significant ST/T wave abnormalities. Consider changes due to lead placement. She denied chest pain, SOB, cough, fever at PAT RN visit. Denied known CAD. Saw Dr. Johnsie Cancel for PVCs in 2014 and "Echo and exam are normal as are labs."   04/01/19 COVID-19 test negative.  Anesthesia team to evaluate on the day of surgery. If no acute changes then it is anticipated that she can proceed as planned. CXR is still in process.   VS: BP (!) 136/48   Pulse 86   Temp 36.5 C   Resp 18   Ht 4\' 11"  (1.499 m)   Wt 46 kg   LMP  (LMP Unknown)   SpO2 100%   BMI 20.50 kg/m     PROVIDERS: [name], MD is PCP Tat, Wells Guiles, DO is neurologist Owens Loffler, MD is GI. She is scheduled to see Ellouise Newer, [place] on 04/11/19.  Higgs, Mathis Dad, MD is hematologist. Last visit 11/08/18 for leukocytosis in setting of recurrent UTI. Given normal WBC with minimal periodic increase, this  was not felt to be clinically significant.  - She is not followed routinely by cardiology, but saw Jenkins Rouge, MD in 2014 for PVCs (with no objective bradycardia) with normal LVEF and felt to be bengin. (EF 50-55%, no wall motion abnormalities 04/2012.)   LABS: Labs reviewed: Acceptable for surgery. (all labs ordered are listed, but only abnormal results are displayed)  Labs Reviewed  BASIC METABOLIC PANEL - Abnormal; Notable for the following components:      Result Value   Glucose, Bld 112 (*)    All other components within normal limits  CBC - Abnormal; Notable for the following components:   HCT 46.9 (*)    All other components within normal limits  URINALYSIS, ROUTINE W REFLEX MICROSCOPIC - Abnormal; Notable for the following components:   Hgb urine dipstick SMALL (*)    All other components within normal limits  SURGICAL PCR SCREEN  APTT  PROTIME-INR    IMAGES: CXR 04/02/19: In process.  Abdominal US 02/25/19: IMPRESSION: Normal study.  No acute sonographic abnormality.  CT abd/pelvis 02/25/19: IMPRESSION: - Previously seen right lower lobe subpleural nodule has decreased in size consistent with a postinflammatory nature. - Left ovarian cyst stable from the prior exam. - No other focal abnormality is seen.   EKG: 04/02/19: Normal sinus rhythm Left axis deviation Minimal voltage criteria for LVH, may be normal variant Possible Lateral infarct , age undetermined Abnormal ECG - R wave progression worse in V4-5 when compared to 08/22/17 tracing      CV: Echo 05/22/12: Study Conclusions  -  Left ventricle: The cavity size was normal. Wall thickness  was normal. Systolic function was normal. The estimated  ejection fraction was in the range of 50% to 55%. Wall  motion was normal; there were no regional wall motion  abnormalities. Left ventricular diastolic function  parameters were normal.  - Atrial septum: No defect or patent foramen ovale was  identified.    Carotid US 08/09/11: Conclusion: - Mild bilateral plaque without significant stenosis (< 30% BICA stenoses; < 50% BECA stenoses) - Incidental right thyroid nodule   Past Medical History:  Diagnosis Date  . Bell's palsy 1966   ? right side facial droop  . Carotid artery disease ([place]) 2010   on vascular screening;unchanged 2013.(could not tolerate simvastatin, no other statins tried)--<30% blockage bilat 07/2011  . Chronic abdominal pain   . Chronic fatigue and malaise   . Claustrophobia   . Depression    treated in the past for years;stopped in 2010 for a years  . Duodenal ulcer 1962   h/o  . Dysrhythmia    ocassional PVC's  . Fibromyalgia   . Frequent PVCs 07/2012   Seen by [facility]: benign, asymptomatic, normal EF  . GERD (gastroesophageal reflux disease)   . Glaucoma, narrow-angle    s/p laser surgery  . History of hiatal hernia    during endoscopy  . Hypothyroid 9/08  . IBS (irritable bowel syndrome)    Dr. Benson Norway  . Ischemic colitis ([place]) 11/21/2018  . Ocular migraine   . Osteoporosis 10/11   Dr.Hawkes  . Panic attack   . Recurrent UTI    has cystocele-Dr.Grewal  . Shingles 1999   h/o  . Superficial thrombophlebitis 03/2009   RLE  . Trochanteric bursitis 12/2008   bilateral    Past Surgical History:  Procedure Laterality Date  . ABDOMINAL HYSTERECTOMY    . CATARACT EXTRACTION, BILATERAL  1995, 1996  . Flexible sigmoidoscopy    . THYROIDECTOMY, PARTIAL  09/2005   L nodule; Dr. Harlow Asa  . TONSILLECTOMY  1946  . UPPER GI ENDOSCOPY  06/27/12  . VAGINAL HYSTERECTOMY  1971   and bladder repair.  Still has ovaries    MEDICATIONS: . acetaminophen (TYLENOL) 500 MG tablet  . ALPRAZolam (XANAX) 0.5 MG tablet  . calcium carbonate (OSCAL) 1500 (600 Ca) MG TABS tablet  . Carbidopa-Levodopa ER (SINEMET CR) 25-100 MG tablet controlled release  . Cholecalciferol (VITAMIN D) 2000 UNITS tablet  . ciprofloxacin (CIPRO) 250 MG tablet  . diclofenac (VOLTAREN) 75 MG  EC tablet  . dicyclomine (BENTYL) 10 MG capsule  . metoprolol tartrate (LOPRESSOR) 25 MG tablet  . Probiotic Product (ALIGN PO)  . SYNTHROID 25 MCG tablet  . VITAMIN E PO   No current facility-administered medications for this encounter.     [name], PA-C Surgical Short Stay/Anesthesiology [facility] Phone [phone] [facility] Phone [phone] 04/02/2019 4:41 PM

## 2019-04-02 NOTE — H&P (Signed)
Subjective:   For quality, patient reports aching, burning, gnawing, stabbing, throbbing, sharp, constant, and worsening. For severity, she reports pain level 20/10. For duration, she reports continuous since onset. For timing, she reports acute. For context, she reports overuse. For alleviating factors, she reports nothing helps. For aggravating factors, she reports sitting, standing, lying down, walking, lifting, carrying, twisting, bending/squatting, pushing/pulling, throwing, weightbearing, and exercise. For associated symptoms, she reports no weakness, no numbness, no tingling, no swelling, no redness, no warmth, no ecchymosis, no catching/locking, no popping/clicking, no buckling, no grinding, no instability, no radiation down leg, no drainage, no fever, no weight loss, and no change in bowel/bladder habits. For previous surgery, she reports none. For prior imaging, she reports x ray and mri. For previous injections, she reports none. For previous pt, she reports none. For work related, she reports no. For working, she reports no.  Patient Active Problem List   Diagnosis Date Noted  . Compression fracture of L1 lumbar vertebra ([place]) 03/16/2019  . Osteoporosis of lumbar spine 03/16/2019  . Essential tremor 02/06/2019  . Ischemic colitis ([place])   . Leukocytosis   . Acute colitis 11/22/2018  . Colitis 11/22/2018  . Acute lower UTI 11/22/2018  . Acute cystitis without hematuria   . Parkinson's disease ([place]) 10/24/2018  . Gait disturbance 11/28/2017  . Chronic lymphocytic thyroiditis 04/24/2017  . Status post removal of thyroid nodule 04/24/2017  . Depression with anxiety 03/16/2017  . Subcutaneous nodules 03/16/2017  . Aortic atherosclerosis ([place]) 11/24/2016  . Allergy to multiple antibiotics 10/07/2016  . Seafood allergy, anaphylaxis, subsequent encounter 10/07/2016  . Helicobacter pylori gastritis 10/07/2016  . Fall 09/05/2016  . Bloating 08/15/2016  . Severe recurrent major  depression without psychotic features ([place]) 04/13/2016    Class: Chronic  . Rash and nonspecific skin eruption 03/01/2016  . Leg pain, bilateral 03/01/2016  . Functional dyspepsia 10/13/2015  . Subacromial bursitis 03/02/2015  . Resting tremor 02/01/2015  . Epigastric fullness 10/12/2014  . Early satiety 10/12/2014  . Cystocele 12/30/2013  . IBS (irritable bowel syndrome) 09/30/2013  . GERD (gastroesophageal reflux disease) 06/27/2013  . PVC's (premature ventricular contractions) 10/02/2012  . Depressive disorder, not elsewhere classified 10/02/2012  . Bradycardia 08/01/2012  . Fatigue 08/01/2012  . Anxiety state 05/13/2012  . Osteopenia 05/13/2012  . Hypothyroidism 05/13/2012   Past Medical History:  Diagnosis Date  . Bell's palsy 1966   Hx: right side facial droop, resolved per patient 04/02/19  . Carotid artery disease ([place]) 2010   on vascular screening;unchanged 2013.(could not tolerate simvastatin, no other statins tried)--<30% blockage bilat 07/2011  . Chronic abdominal pain   . Chronic fatigue and malaise   . Claustrophobia   . Depression    treated in the past for years;stopped in 2010 for a years  . Duodenal ulcer 1962   h/o  . Dysrhythmia    ocassional PVC's, no current problems per patient on 04/02/19  . Fibromyalgia   . Frequent PVCs 07/2012   Seen by [facility]: benign, asymptomatic, normal EF  . GERD (gastroesophageal reflux disease)    diet controlled  . Glaucoma, narrow-angle    s/p laser surgery  . History of hiatal hernia    during endoscopy  . Hypothyroid 9/08  . IBS (irritable bowel syndrome)    Dr. Benson Norway  . Ischemic colitis ([place]) 11/21/2018   no current problems per patient on 04/02/19  . Ocular migraine   . Osteoporosis 10/11   Dr.Hawkes  . Panic attack   .  Parkinson disease ([place])   . Recurrent UTI    has cystocele-Dr.Grewal  . Shingles 1999   h/o  . Superficial thrombophlebitis 03/2009   RLE  . Trochanteric bursitis 12/2008   bilateral     Past Surgical History:  Procedure Laterality Date  . ABDOMINAL HYSTERECTOMY    . CATARACT EXTRACTION, BILATERAL  1995, 1996  . EYE SURGERY Bilateral    laser - glaucoma  . Flexible sigmoidoscopy    . THYROIDECTOMY, PARTIAL  09/2005   L nodule; Dr. Harlow Asa  . TONSILLECTOMY  1946  . UPPER GI ENDOSCOPY  06/27/12  . VAGINAL HYSTERECTOMY  1971   and bladder repair.  Still has ovaries  . WISDOM TOOTH EXTRACTION      Current Outpatient Medications  Medication Sig Dispense Refill Last Dose  . acetaminophen (TYLENOL) 500 MG tablet Take 500-1,000 mg by mouth 3 (three) times daily as needed (pain.).      Marland Kitchen ALPRAZolam (XANAX) 0.5 MG tablet TAKE BY MOUTH UP TO 3 TIMES DAILY. WARNING: BENZODIAZEPINES INCREASE THE RISK OF FALLS AND NEUROCOGNITIVE DECLINE IN THE ELDERLY (Patient taking differently: Take 0.5-1 mg by mouth See admin instructions. Take 1 tablet (0.5 mg) by mouth twice daily & take 2 tablets(1 mg) by mouth in the evening.) 90 tablet 2 Taking  . calcium carbonate (OSCAL) 1500 (600 Ca) MG TABS tablet Take 600 mg by mouth 2 (two) times daily.     . Carbidopa-Levodopa ER (SINEMET CR) 25-100 MG tablet controlled release Take 1 tablet by mouth 4 (four) times daily. (Patient taking differently: Take 1 tablet by mouth 3 (three) times daily before meals. ) 360 tablet 1 Taking  . Cholecalciferol (VITAMIN D) 2000 UNITS tablet Take 2,000 Units by mouth daily.   Taking  . ciprofloxacin (CIPRO) 250 MG tablet Take 1 tablet (250 mg total) by mouth 2 (two) times daily. (Patient not taking: Reported on 03/11/2019) 14 tablet 0 Not Taking  . diclofenac (VOLTAREN) 75 MG EC tablet Take 1 tablet (75 mg total) by mouth 2 (two) times daily. (Patient not taking: Reported on 03/27/2019) 20 tablet 0 Not Taking at Unknown time  . dicyclomine (BENTYL) 10 MG capsule Take 1 capsule (10 mg total) by mouth every 6 (six) hours as needed for spasms. (Patient not taking: Reported on 03/06/2019) 50 capsule 3 Not Taking  . metoprolol  tartrate (LOPRESSOR) 25 MG tablet 1/2 to 1 po in the morning (Patient not taking: Reported on 03/06/2019) 30 tablet 11 Not Taking  . Polyethylene Glycol 3350 (MIRALAX PO) Take by mouth as needed.     . Probiotic Product (ALIGN PO) Take 1 capsule by mouth daily.    Taking  . SYNTHROID 25 MCG tablet TAKE 1 TABLET DAILY BEFORE BREAKFAST (Patient taking differently: Take 25 mcg by mouth daily before breakfast. ) 90 tablet 1 Taking  . VITAMIN E PO Take 1 capsule by mouth 3 (three) times a week.      No current facility-administered medications for this visit.    Allergies  Allergen Reactions  . Iodine Anaphylaxis    IV and topical forms.  Burnard Leigh [Hyoscyamine Sulfate]     Vision problems/pt has glaucoma  . Salmon [Fish Allergy] Hives and Shortness Of Breath  . Shellfish Allergy Anaphylaxis  . Remeron [Mirtazapine] Other (See Comments)    Cause blurred vision and red eyes, pt has glaucoma  . Aspirin Other (See Comments)    Sever stomach pain due to ulcer scaring.  . Ciprofloxacin Diarrhea  . Codeine  Nausea And Vomiting  . Darvocet [Propoxyphene N-Acetaminophen] Nausea And Vomiting  . Demerol  [Meperidine Hcl]   . Demerol [Meperidine] Nausea Only  . Dexilant [Dexlansoprazole] Swelling    Redness, swelling and peeling of both feet.  . Diphedryl [Diphenhydramine] Other (See Comments)    Increased pulse/small amount ok  . Doxycycline Hyclate Other (See Comments)    GI intolerance.  Marland Kitchen Epinephrine Other (See Comments)    Breathing problems  . Erythromycin Other (See Comments)    GI intolerance.  Yvette Rack [Cyclobenzaprine] Other (See Comments)    Tingly/prickly sensation.  Marland Kitchen Keflex [Cephalexin] Hives  . Latex Other (See Comments)    Gloves ok.  Skin gets red from elastic in underwear and latex bandaides.  . Nitrofurantoin Diarrhea  . Other   . Prednisone Other (See Comments)    Headache  . Pylera [Bis Subcit-Metronid-Tetracyc] Swelling    Tongue swelling. Face tingling  . Sulfa  Antibiotics Other (See Comments)    Increased pulse, fainting, diarrhea, thrush  . Xylocaine [Lidocaine Hcl]     With epinephrine, given by dentist.  Speeded up heart rate and she passed out (occured twice, at dentist)  . Zoloft [Sertraline Hcl] Swelling and Other (See Comments)    Migraine Swelling of tongue/lip (09/2012)  . Advil [Ibuprofen] Other (See Comments)    Motrin ok with a GI effect.  . Clarithromycin Rash    Started after completing 10 day course of 2000 mg /day    Social History   Tobacco Use  . Smoking status: Never Smoker  . Smokeless tobacco: Never Used  Substance Use Topics  . Alcohol use: No    Alcohol/week: 0.0 standard drinks    Family History  Problem Relation Age of Onset  . Heart disease Mother   . Hypertension Mother   . Hypertension Sister   . HIV Son   . Heart disease Brother   . Lung cancer Brother        lung  . Diabetes Maternal Grandfather   . Diabetes Granddaughter        type 1    Review of Systems As stated in HPI  Objective:   Clinical exam: Melvia is a pleasant individual, who appears younger than their stated age. She Is alert and orientated 3. No shortness of breath, chest pain. Abdomen is soft and non-tender, negative loss of bowel and bladder control, no rebound tenderness. Negative: skin lesions abrasions contusions  Lungs: Clear to auscultation bilaterally  Cardiac: Regular rate and rhythm no rubs gallops murmurs  Peripheral pulses: 1+ dorsalis pedis/posterior tibialis pulses bilaterally. Compartment soft and nontender.  Gait pattern: Limited ability to ambulate due to severe lumbar pain. She is grossly stable when she attempts to ambulate.  Assistive devices: Currently using a wheelchair because of her severe acute low back pain.  Neuro: No focal motor or sensory deficits on exam in the lower extremity. Negative nerve root tension signs in the lower extremity. Negative Babinski test, no clonus, 1+ deep tendon reflexes at the  knee and Achilles.  Musculoskeletal: Severe low back pain with direct palpation in the mid to upper lumbar spine. No significant SI joint pain. No hip, knee, ankle pain with isolated joint range of motion  X-rays of the lumbar spine demonstrate an L1 compression fracture with kyphosis. Mild degenerative disc disease in the remainder of the lower lumbar spine.  MRI of the lumbar spine dated 03/20/19 demonstrates L1 acute compression fracture with 40% loss of anterior height. There is a  slight amount of retropulsion. But no significant canal stenosis is noted at that level. Mild degenerative changes throughout the remainder of the lumbar spine. Conus is in normal position and appearance.  Assessment:   Antony Haste is a pleasant otherwise active 85 year old woman who has been having severe debilitating pain since 03/09/19. She was relocating a couch and felt an acute severe pain in her low back. Since then she states she cannot walk far distances and she is been very limited. Imaging studies demonstrate an L1 acute osteoporotic compression fracture with 4 mm of retropulsion. X-rays confirm the anterior compression deformity of L1.  Plan:    At this point time the patient states the pain is debilitating and she would like to move forward with surgical intervention. At this point I think it is reasonable to move forward with an L1 kyphoplasty. I have gone over the surgery in great detail with the patient and her husband and I provided them with literature. I have also discussed the risks and benefits of surgery.Risks of surgery include: Infection, bleeding, death, stroke, paralysis, nerve damage, leak of cement, need for additional surgery including open decompression. Ongoing or worse pain.   Goals of surgery: Reduction in pain, and improvement in quality of life All of their questions were encouraged and addressed. We will plan on moving forward with the L1 kyphoplasty after we have obtained clearance from her  primary care physician. Treatment plan: L1 kyphoplasty

## 2019-04-03 ENCOUNTER — Ambulatory Visit (HOSPITAL_COMMUNITY): Payer: Medicare Other

## 2019-04-03 ENCOUNTER — Observation Stay (HOSPITAL_COMMUNITY)
Admission: RE | Admit: 2019-04-03 | Discharge: 2019-04-04 | Disposition: A | Discharge status: Home / Self Care | Payer: Medicare Other | Attending: Orthopedic Surgery | Admitting: Orthopedic Surgery

## 2019-04-03 ENCOUNTER — Encounter (HOSPITAL_COMMUNITY): Payer: Self-pay | Admitting: Anesthesiology

## 2019-04-03 ENCOUNTER — Ambulatory Visit (HOSPITAL_COMMUNITY): Payer: Medicare Other | Admitting: Anesthesiology

## 2019-04-03 ENCOUNTER — Encounter: Payer: Self-pay | Admitting: Family Medicine

## 2019-04-03 ENCOUNTER — Ambulatory Visit (HOSPITAL_COMMUNITY): Payer: Medicare Other | Admitting: Vascular Surgery

## 2019-04-03 ENCOUNTER — Encounter (HOSPITAL_COMMUNITY)
Admission: RE | Disposition: A | Discharge status: Home / Self Care | Payer: Self-pay | Source: Home / Self Care | Attending: Orthopedic Surgery

## 2019-04-03 ENCOUNTER — Other Ambulatory Visit: Payer: Self-pay

## 2019-04-03 DIAGNOSIS — Z419 Encounter for procedure for purposes other than remedying health state, unspecified: Secondary | ICD-10-CM

## 2019-04-03 DIAGNOSIS — Z881 Allergy status to other antibiotic agents status: Secondary | ICD-10-CM | POA: Insufficient documentation

## 2019-04-03 DIAGNOSIS — M5137 Other intervertebral disc degeneration, lumbosacral region: Secondary | ICD-10-CM | POA: Diagnosis not present

## 2019-04-03 DIAGNOSIS — Z886 Allergy status to analgesic agent status: Secondary | ICD-10-CM | POA: Diagnosis not present

## 2019-04-03 DIAGNOSIS — M8008XA Age-related osteoporosis with current pathological fracture, vertebra(e), initial encounter for fracture: Principal | ICD-10-CM | POA: Insufficient documentation

## 2019-04-03 DIAGNOSIS — Z885 Allergy status to narcotic agent status: Secondary | ICD-10-CM | POA: Diagnosis not present

## 2019-04-03 DIAGNOSIS — G20 Parkinson's disease: Secondary | ICD-10-CM | POA: Insufficient documentation

## 2019-04-03 DIAGNOSIS — Z79890 Hormone replacement therapy (postmenopausal): Secondary | ICD-10-CM | POA: Insufficient documentation

## 2019-04-03 DIAGNOSIS — Z79899 Other long term (current) drug therapy: Secondary | ICD-10-CM | POA: Insufficient documentation

## 2019-04-03 DIAGNOSIS — F418 Other specified anxiety disorders: Secondary | ICD-10-CM | POA: Diagnosis not present

## 2019-04-03 DIAGNOSIS — S32010A Wedge compression fracture of first lumbar vertebra, initial encounter for closed fracture: Secondary | ICD-10-CM | POA: Diagnosis not present

## 2019-04-03 DIAGNOSIS — Z882 Allergy status to sulfonamides status: Secondary | ICD-10-CM | POA: Insufficient documentation

## 2019-04-03 DIAGNOSIS — Z01818 Encounter for other preprocedural examination: Secondary | ICD-10-CM

## 2019-04-03 DIAGNOSIS — X500XXA Overexertion from strenuous movement or load, initial encounter: Secondary | ICD-10-CM | POA: Diagnosis not present

## 2019-04-03 DIAGNOSIS — Z888 Allergy status to other drugs, medicaments and biological substances status: Secondary | ICD-10-CM | POA: Insufficient documentation

## 2019-04-03 DIAGNOSIS — S32000A Wedge compression fracture of unspecified lumbar vertebra, initial encounter for closed fracture: Secondary | ICD-10-CM | POA: Diagnosis present

## 2019-04-03 DIAGNOSIS — E039 Hypothyroidism, unspecified: Secondary | ICD-10-CM | POA: Insufficient documentation

## 2019-04-03 DIAGNOSIS — K219 Gastro-esophageal reflux disease without esophagitis: Secondary | ICD-10-CM | POA: Diagnosis not present

## 2019-04-03 DIAGNOSIS — M8088XA Other osteoporosis with current pathological fracture, vertebra(e), initial encounter for fracture: Secondary | ICD-10-CM | POA: Diagnosis not present

## 2019-04-03 DIAGNOSIS — Z981 Arthrodesis status: Secondary | ICD-10-CM | POA: Diagnosis not present

## 2019-04-03 HISTORY — PX: KYPHOPLASTY: SHX5884

## 2019-04-03 SURGERY — KYPHOPLASTY
Anesthesia: Monitor Anesthesia Care

## 2019-04-03 MED ORDER — BUPIVACAINE LIPOSOME 1.3 % IJ SUSP
20.0000 mL | Freq: Once | INTRAMUSCULAR | Status: DC
  Filled 2019-04-03: qty 20

## 2019-04-03 MED ORDER — ALPRAZOLAM 0.5 MG PO TABS
1.0000 mg | ORAL_TABLET | Freq: Every day | ORAL | Status: DC
  Administered 2019-04-03: 1 mg via ORAL
  Filled 2019-04-03: qty 2

## 2019-04-03 MED ORDER — BUPIVACAINE HCL (PF) 0.25 % IJ SOLN
INTRAMUSCULAR | Status: DC | PRN
  Administered 2019-04-03: 20 mL

## 2019-04-03 MED ORDER — ACETAMINOPHEN 10 MG/ML IV SOLN
1000.0000 mg | Freq: Once | INTRAVENOUS | Status: AC
  Administered 2019-04-03: 1000 mg via INTRAVENOUS

## 2019-04-03 MED ORDER — PHENOL 1.4 % MT LIQD: 1.0000 | OROMUCOSAL | Status: DC | PRN

## 2019-04-03 MED ORDER — METHOCARBAMOL 500 MG PO TABS
500.0000 mg | ORAL_TABLET | Freq: Four times a day (QID) | ORAL | Status: DC | PRN
  Administered 2019-04-03: 500 mg via ORAL
  Filled 2019-04-03: qty 1

## 2019-04-03 MED ORDER — ONDANSETRON HCL 4 MG/2ML IJ SOLN: 4.0000 mg | Freq: Once | INTRAMUSCULAR | Status: DC | PRN

## 2019-04-03 MED ORDER — MIDAZOLAM HCL 2 MG/2ML IJ SOLN
1.0000 mg | Freq: Once | INTRAMUSCULAR | Status: AC
  Administered 2019-04-03: 1 mg via INTRAVENOUS

## 2019-04-03 MED ORDER — TRAMADOL HCL 50 MG PO TABS: 50.0000 mg | ORAL_TABLET | Freq: Four times a day (QID) | ORAL | Status: AC | PRN

## 2019-04-03 MED ORDER — FENTANYL CITRATE (PF) 100 MCG/2ML IJ SOLN
INTRAMUSCULAR | Status: DC | PRN
  Administered 2019-04-03 (×2): 50 ug via INTRAVENOUS

## 2019-04-03 MED ORDER — BUPIVACAINE-EPINEPHRINE (PF) 0.25% -1:200000 IJ SOLN
INTRAMUSCULAR | Status: AC
  Filled 2019-04-03: qty 30

## 2019-04-03 MED ORDER — 0.9 % SODIUM CHLORIDE (POUR BTL) OPTIME
TOPICAL | Status: DC | PRN
  Administered 2019-04-03: 1000 mL

## 2019-04-03 MED ORDER — IOPAMIDOL (ISOVUE-300) INJECTION 61%
INTRAVENOUS | Status: DC | PRN
  Administered 2019-04-03: 50 mL

## 2019-04-03 MED ORDER — VANCOMYCIN HCL IN DEXTROSE 1-5 GM/200ML-% IV SOLN
1000.0000 mg | INTRAVENOUS | Status: AC
  Administered 2019-04-03: 1000 mg via INTRAVENOUS
  Filled 2019-04-03: qty 200

## 2019-04-03 MED ORDER — ACETAMINOPHEN 10 MG/ML IV SOLN
INTRAVENOUS | Status: AC
  Filled 2019-04-03: qty 100

## 2019-04-03 MED ORDER — LACTATED RINGERS IV SOLN: INTRAVENOUS | Status: DC

## 2019-04-03 MED ORDER — ACETAMINOPHEN 325 MG PO TABS
650.0000 mg | ORAL_TABLET | ORAL | Status: DC | PRN
  Administered 2019-04-03 – 2019-04-04 (×3): 650 mg via ORAL
  Filled 2019-04-03 (×3): qty 2

## 2019-04-03 MED ORDER — ONDANSETRON HCL 4 MG PO TABS: 4.0000 mg | ORAL_TABLET | Freq: Four times a day (QID) | ORAL | Status: DC | PRN

## 2019-04-03 MED ORDER — ALPRAZOLAM 0.5 MG PO TABS: 0.5000 mg | ORAL_TABLET | ORAL | Status: DC

## 2019-04-03 MED ORDER — VANCOMYCIN HCL 500 MG IV SOLR
500.0000 mg | INTRAVENOUS | Status: DC
  Administered 2019-04-04: 500 mg via INTRAVENOUS
  Filled 2019-04-03 (×2): qty 500

## 2019-04-03 MED ORDER — BUPIVACAINE LIPOSOME 1.3 % IJ SUSP
INTRAMUSCULAR | Status: DC | PRN
  Administered 2019-04-03: 10 mL

## 2019-04-03 MED ORDER — PROPOFOL 500 MG/50ML IV EMUL
INTRAVENOUS | Status: DC | PRN
  Administered 2019-04-03: 25 ug/kg/min via INTRAVENOUS

## 2019-04-03 MED ORDER — ONDANSETRON HCL 4 MG/2ML IJ SOLN: 4.0000 mg | Freq: Four times a day (QID) | INTRAMUSCULAR | Status: DC | PRN

## 2019-04-03 MED ORDER — FENTANYL CITRATE (PF) 100 MCG/2ML IJ SOLN
INTRAMUSCULAR | Status: AC
  Filled 2019-04-03: qty 2

## 2019-04-03 MED ORDER — FENTANYL CITRATE (PF) 250 MCG/5ML IJ SOLN
INTRAMUSCULAR | Status: AC
  Filled 2019-04-03: qty 5

## 2019-04-03 MED ORDER — LACTATED RINGERS IV SOLN
INTRAVENOUS | Status: DC
  Administered 2019-04-03: 13:00:00 via INTRAVENOUS

## 2019-04-03 MED ORDER — DIPHENHYDRAMINE HCL 25 MG PO CAPS
25.0000 mg | ORAL_CAPSULE | Freq: Four times a day (QID) | ORAL | Status: DC | PRN
  Administered 2019-04-03: 25 mg via ORAL
  Filled 2019-04-03: qty 1

## 2019-04-03 MED ORDER — MENTHOL 3 MG MT LOZG: 1.0000 | LOZENGE | OROMUCOSAL | Status: DC | PRN

## 2019-04-03 MED ORDER — MIDAZOLAM HCL 2 MG/2ML IJ SOLN
INTRAMUSCULAR | Status: AC
  Administered 2019-04-03: 1 mg via INTRAVENOUS
  Filled 2019-04-03: qty 2

## 2019-04-03 MED ORDER — ALPRAZOLAM 0.5 MG PO TABS: 0.5000 mg | ORAL_TABLET | Freq: Two times a day (BID) | ORAL | Status: DC

## 2019-04-03 MED ORDER — ACETAMINOPHEN 650 MG RE SUPP: 650.0000 mg | RECTAL | Status: DC | PRN

## 2019-04-03 MED ORDER — ACETAMINOPHEN 500 MG PO TABS: 500.0000 mg | ORAL_TABLET | Freq: Three times a day (TID) | ORAL | Status: DC | PRN

## 2019-04-03 MED ORDER — MIDAZOLAM HCL 2 MG/2ML IJ SOLN
1.0000 mg | Freq: Once | INTRAMUSCULAR | Status: AC
  Administered 2019-04-03: 13:00:00 1 mg via INTRAVENOUS

## 2019-04-03 MED ORDER — ONDANSETRON HCL 4 MG/2ML IJ SOLN
INTRAMUSCULAR | Status: DC | PRN
  Administered 2019-04-03: 4 mg via INTRAVENOUS

## 2019-04-03 MED ORDER — TRAMADOL HCL 50 MG PO TABS
50.0000 mg | ORAL_TABLET | Freq: Four times a day (QID) | ORAL | Status: DC | PRN
  Administered 2019-04-03: 50 mg via ORAL
  Filled 2019-04-03: qty 1

## 2019-04-03 MED ORDER — CARBIDOPA-LEVODOPA ER 25-100 MG PO TBCR
1.0000 | EXTENDED_RELEASE_TABLET | Freq: Three times a day (TID) | ORAL | Status: DC
  Administered 2019-04-04: 1 via ORAL
  Filled 2019-04-03 (×4): qty 1

## 2019-04-03 MED ORDER — METHOCARBAMOL 1000 MG/10ML IJ SOLN
500.0000 mg | Freq: Four times a day (QID) | INTRAVENOUS | Status: DC | PRN
  Filled 2019-04-03: qty 5

## 2019-04-03 MED ORDER — FENTANYL CITRATE (PF) 100 MCG/2ML IJ SOLN
25.0000 ug | INTRAMUSCULAR | Status: DC | PRN
  Administered 2019-04-03: 25 ug via INTRAVENOUS

## 2019-04-03 MED ORDER — SODIUM CHLORIDE 0.9% FLUSH: 3.0000 mL | INTRAVENOUS | Status: DC | PRN

## 2019-04-03 MED ORDER — BUPIVACAINE HCL (PF) 0.25 % IJ SOLN
INTRAMUSCULAR | Status: AC
  Filled 2019-04-03: qty 30

## 2019-04-03 MED ORDER — DIPHENHYDRAMINE HCL 50 MG/ML IJ SOLN
12.5000 mg | Freq: Once | INTRAMUSCULAR | Status: AC
  Administered 2019-04-03: 14:00:00 12.5 mg via INTRAVENOUS

## 2019-04-03 MED ORDER — DIPHENHYDRAMINE HCL 50 MG/ML IJ SOLN
INTRAMUSCULAR | Status: AC
  Administered 2019-04-03: 12.5 mg via INTRAVENOUS
  Filled 2019-04-03: qty 1

## 2019-04-03 MED ORDER — SODIUM CHLORIDE 0.9% FLUSH
3.0000 mL | Freq: Two times a day (BID) | INTRAVENOUS | Status: DC
  Administered 2019-04-03: 3 mL via INTRAVENOUS

## 2019-04-03 MED ORDER — LEVOTHYROXINE SODIUM 25 MCG PO TABS
25.0000 ug | ORAL_TABLET | Freq: Every day | ORAL | Status: DC
  Administered 2019-04-04: 25 ug via ORAL
  Filled 2019-04-03: qty 1

## 2019-04-03 SURGICAL SUPPLY — 48 items
ADH SKN CLS APL DERMABOND .7 (GAUZE/BANDAGES/DRESSINGS) ×1
BLADE SURG 15 STRL LF DISP TIS (BLADE) ×1 IMPLANT
BLADE SURG 15 STRL SS (BLADE) ×2
BNDG ADH 1X3 SHEER STRL LF (GAUZE/BANDAGES/DRESSINGS) ×3 IMPLANT
BNDG ADH THN 3X1 STRL LF (GAUZE/BANDAGES/DRESSINGS) ×1
CEMENT KYPHON CX01A KIT/MIXER (Cement) ×2 IMPLANT
COVER SURGICAL LIGHT HANDLE (MISCELLANEOUS) ×2 IMPLANT
COVER WAND RF STERILE (DRAPES) ×2 IMPLANT
CURETTE EXPRESS SZ2 7MM (INSTRUMENTS) IMPLANT
CURETTE WEDGE 8.5MM KYPHX (MISCELLANEOUS) IMPLANT
CURRETTE EXPRESS SZ2 7MM (INSTRUMENTS)
DERMABOND ADVANCED (GAUZE/BANDAGES/DRESSINGS) ×1
DERMABOND ADVANCED .7 DNX12 (GAUZE/BANDAGES/DRESSINGS) ×1 IMPLANT
DRAPE C-ARM 42X72 X-RAY (DRAPES) ×4 IMPLANT
DRAPE INCISE IOBAN 66X45 STRL (DRAPES) ×2 IMPLANT
DRAPE LAPAROTOMY T 102X78X121 (DRAPES) ×2 IMPLANT
DRAPE SURG 17X23 STRL (DRAPES) ×2 IMPLANT
DRAPE U-SHAPE 47X51 STRL (DRAPES) ×2 IMPLANT
DRAPE WARM FLUID 44X44 (DRAPES) ×2 IMPLANT
DURAPREP 26ML APPLICATOR (WOUND CARE) ×2 IMPLANT
GAUZE 4X4 16PLY RFD (DISPOSABLE) ×1 IMPLANT
GLOVE BIO SURGEON STRL SZ 6.5 (GLOVE) ×2 IMPLANT
GLOVE BIOGEL PI IND STRL 6.5 (GLOVE) ×1 IMPLANT
GLOVE BIOGEL PI IND STRL 8.5 (GLOVE) ×1 IMPLANT
GLOVE BIOGEL PI INDICATOR 6.5 (GLOVE) ×1
GLOVE BIOGEL PI INDICATOR 8.5 (GLOVE) ×1
GLOVE SS BIOGEL STRL SZ 8.5 (GLOVE) ×1 IMPLANT
GLOVE SUPERSENSE BIOGEL SZ 8.5 (GLOVE) ×1
GOWN STRL REUS W/ TWL LRG LVL3 (GOWN DISPOSABLE) ×2 IMPLANT
GOWN STRL REUS W/TWL 2XL LVL3 (GOWN DISPOSABLE) ×2 IMPLANT
GOWN STRL REUS W/TWL LRG LVL3 (GOWN DISPOSABLE) ×4
KIT BASIN OR (CUSTOM PROCEDURE TRAY) ×2 IMPLANT
KIT TURNOVER KIT B (KITS) ×2 IMPLANT
NDL SPNL 18GX3.5 QUINCKE PK (NEEDLE) ×2 IMPLANT
NEEDLE SPNL 18GX3.5 QUINCKE PK (NEEDLE) ×4 IMPLANT
NS IRRIG 1000ML POUR BTL (IV SOLUTION) ×2 IMPLANT
PACK SURGICAL SETUP 50X90 (CUSTOM PROCEDURE TRAY) ×2 IMPLANT
PACK UNIVERSAL I (CUSTOM PROCEDURE TRAY) ×2 IMPLANT
PAD ARMBOARD 7.5X6 YLW CONV (MISCELLANEOUS) ×4 IMPLANT
SPONGE LAP 4X18 RFD (DISPOSABLE) ×2 IMPLANT
SUT MNCRL AB 3-0 PS2 18 (SUTURE) ×2 IMPLANT
SUT MON AB 3-0 SH 27 (SUTURE) ×2
SUT MON AB 3-0 SH27 (SUTURE) IMPLANT
SYR CONTROL 10ML LL (SYRINGE) ×2 IMPLANT
TOWEL GREEN STERILE (TOWEL DISPOSABLE) ×2 IMPLANT
TRAY KYPHOPAK 15/3 ONESTEP 1ST (MISCELLANEOUS) IMPLANT
TRAY KYPHOPAK 20/3 ONESTEP 1ST (MISCELLANEOUS) IMPLANT
WATER STERILE IRR 1000ML POUR (IV SOLUTION) ×2 IMPLANT

## 2019-04-03 NOTE — H&P (Signed)
Addendum H&P.  Patient continues to have severe diffuse back pain radiating to the scapula and into the lumbar spine.  No significant complaints of lower extremity pain.  No change in her clinical exam since her last note of 04/02/2019.  Plan on moving forward with L1 kyphoplasty for osteoporotic compression fracture.  Patient is aware of the plan as well as the risks and benefits and has indicated she would like to move forward with surgery.  Plan on doing a IV sedation with local anesthesia.

## 2019-04-03 NOTE — Progress Notes (Addendum)
Pharmacy Antibiotic Note  Tracy Malone is a 85 y.o. female admitted on 04/03/2019 for surgery for Lumbar one osteoporitic compression fracture. S/p L1 kyphoplasty today 04/03/19.  Pharmacy has been consulted for Vancomycin dosing for surgical prophylaxis.  WBC wnl, afeb Patient received preop Vancomycin 1g x1 today 9/10 at 13:30.  85 y.o female, wt only 46 kg,  CrCl 35 ml/min  NO drain noted, thus will give 1 dose of Vancomycin 12 hours post op.   Plan: Vancomycin 500 mg IV x1 at 0130 tomorrow AM. Pharmacy will sign off. Re-consult pharmacy if vancomycin needs to continue.    Height: 4\' 11"  (149.9 cm) Weight: 101 lb 8 oz (46 kg) IBW/kg (Calculated) : 43.2  Temp (24hrs), Avg:98.1 F (36.7 C), Min:97.7 F (36.5 C), Max:98.6 F (37 C)  Recent Labs  Lab 04/02/19 1346  WBC 10.4  CREATININE 0.51    Estimated Creatinine Clearance: 35.1 mL/min (by C-G formula based on SCr of 0.51 mg/dL).    Allergies  Allergen Reactions  . Iodine Anaphylaxis    IV and topical forms.  Burnard Leigh [Hyoscyamine Sulfate]     Vision problems/pt has glaucoma  . Salmon [Fish Allergy] Hives and Shortness Of Breath  . Shellfish Allergy Anaphylaxis  . Remeron [Mirtazapine] Other (See Comments)    Cause blurred vision and red eyes, pt has glaucoma  . Aspirin Other (See Comments)    Sever stomach pain due to ulcer scaring.  . Ciprofloxacin Diarrhea  . Codeine Nausea And Vomiting  . Darvocet [Propoxyphene N-Acetaminophen] Nausea And Vomiting  . Demerol  [Meperidine Hcl]   . Demerol [Meperidine] Nausea Only  . Dexilant [Dexlansoprazole] Swelling    Redness, swelling and peeling of both feet.  . Diphedryl [Diphenhydramine] Other (See Comments)    Increased pulse/small amount ok  . Doxycycline Hyclate Other (See Comments)    GI intolerance.  Marland Kitchen Epinephrine Other (See Comments)    Breathing problems  . Erythromycin Other (See Comments)    GI intolerance.  Yvette Rack [Cyclobenzaprine] Other (See Comments)     Tingly/prickly sensation.  Marland Kitchen Keflex [Cephalexin] Hives  . Latex Other (See Comments)    Gloves ok.  Skin gets red from elastic in underwear and latex bandaides.  . Nitrofurantoin Diarrhea  . Other   . Prednisone Other (See Comments)    Headache  . Pylera [Bis Subcit-Metronid-Tetracyc] Swelling    Tongue swelling. Face tingling  . Sulfa Antibiotics Other (See Comments)    Increased pulse, fainting, diarrhea, thrush  . Xylocaine [Lidocaine Hcl]     With epinephrine, given by dentist.  Speeded up heart rate and she passed out (occured twice, at dentist)  . Zoloft [Sertraline Hcl] Swelling and Other (See Comments)    Migraine Swelling of tongue/lip (09/2012)  . Advil [Ibuprofen] Other (See Comments)    Motrin ok with a GI effect.  . Clarithromycin Rash    Started after completing 10 day course of 2000 mg /day     Thank you for allowing pharmacy to be a part of this patient's care.  Nicole Cella, RPh Clinical Pharmacist [phone] (04/03/19 until 10PM) Please check AMION for all [place] phone numbers After 10:00 PM, call [place] [phone] 04/03/2019 5:01 PM

## 2019-04-03 NOTE — Progress Notes (Signed)
Patient has been very anxious.  Verbal orders received for versed and given.  Pt. C/o her stomach burning.  Order received to slow down Vanc by half.

## 2019-04-03 NOTE — Transfer of Care (Signed)
Immediate Anesthesia Transfer of Care Note  Patient: Tracy Malone  Procedure(s) Performed: KYPHOPLASTY L1 (N/A )  Patient Location: PACU  Anesthesia Type:MAC  Level of Consciousness: awake, alert , oriented and sedated  Airway & Oxygen Therapy: Patient Spontanous Breathing and Patient connected to nasal cannula oxygen  Post-op Assessment: Report given to RN and Post -op Vital signs reviewed and stable  Post vital signs: Reviewed and stable  Last Vitals:  Vitals Value Taken Time  BP 105/49 04/03/19 1537  Temp    Pulse 80 04/03/19 1540  Resp 15 04/03/19 1540  SpO2 100 % 04/03/19 1540  Vitals shown include unvalidated device data.  Last Pain:  Vitals:   04/03/19 1216  TempSrc:   PainSc: 10-Worst pain ever      Patients Stated Pain Goal: 2 (07/68/08 8110)  Complications: No apparent anesthesia complications

## 2019-04-03 NOTE — Op Note (Signed)
Operative report  Preoperative diagnosis: osteoporotic L1 compression fracture  Postoperative diagnosis: Same  Operative procedure: L1 kyphoplasty  First Assistant: Cleta Alberts, PA  Complications: None  Indications: Tracy Malone is a very pleasant 85 year old woman with severe acute onset of back pain.  Imaging studies demonstrated osteoporotic compression fracture of L1.  Attempts at conservative management failed to alleviate her symptoms and so we elected to move forward with surgery.  All appropriate risks benefits and alternatives to surgery were discussed and consent was obtained.  Operative report  Patient was brought the operating room turned known on the operating room table.  After successful induction of IV sedation the back was prepped and draped in a standard fashion.  Timeout was taken to confirm patient procedure and all other important data.  Once this was done 2 fluoroscopy machines were brought sterilely into the field 1 for the AP the other for the lateral view.  The L1 pedicle was marked out and the area was infiltrated with Marcaine quarter percent with Exparel.  Once local anesthesia was obtained a small incision was made and the Jamshidi needle was advanced percutaneously to the lateral aspect of the L1 pedicle.  Using the AP and lateral fluoroscopy images I advanced the Jamshidi needle into the L1 pedicle and into the L1 vertebral body.  Again trajectory was confirmed throughout insertion using biplane fluoroscopy images.  Once the pedicle was cannulated I then repeated the exact same procedure on the contralateral side.  The drill was then advanced and then the inflatable bone tamp inserted.  I inflated approximately 1 cc of fluid on both sides.  There was excellent fill with no complications.  I then deflated the left balloon and then inserted approximately 1/2 cc of cement.  I then deflated the right balloon and inserted approximately 1 cc of cement.  I then started and another  half cc cement on the left side.  At this point there was good cement fill on both sides.  I continue to insert the full amount of 1-1/2 cc of cement on each side.  There is a total of 3 cc of cement inserted.  There was excellent fill of the vertebral body with no migration of the cement anterior, posterior, superior, inferior, or laterally.  Once the cement was allowed to harden the Jamshidi trocar was removed and the skin was cleaned.  The stab incisions were closed with interrupted simple 3-0 Monocryl stitches and a dry dressing was applied.  The patient was then transferred to the PACU without incident.  The end of the case all needle sponge counts were correct.  There were no adverse intraoperative events.

## 2019-04-03 NOTE — Addendum Note (Signed)
Addendum  created 04/03/19 1641 by Scheryl Darter, CRNA   Intraprocedure Event edited

## 2019-04-03 NOTE — Anesthesia Preprocedure Evaluation (Addendum)
Anesthesia Evaluation  Patient identified by MRN, date of birth, ID band Patient awake    Reviewed: Allergy & Precautions, NPO status , Patient's Chart, lab work & pertinent test results  Airway Mallampati: III  TM Distance: >3 FB Neck ROM: Full    Dental  (+) Poor Dentition, Chipped, Missing   Pulmonary neg pulmonary ROS,    Pulmonary exam normal breath sounds clear to auscultation       Cardiovascular Normal cardiovascular exam+ dysrhythmias  Rhythm:Regular Rate:Normal  EKG- NSR, LAD, lateral infarct, LVH  Echo 10/302013 Left ventricle: The cavity size was normal. Wall thickness  was normal. Systolic function was normal. The estimated ejection fraction was in the range of 50% to 55%. Wall motion was normal; there were no regional wall motion abnormalities. Left ventricular diastolic functionparameters were normal.  - Atrial septum: No defect or patent foramen ovale was  identified.    Neuro/Psych  Headaches, PSYCHIATRIC DISORDERS Anxiety Depression Parkinson's disease  Neuromuscular disease    GI/Hepatic hiatal hernia, PUD, GERD  Medicated and Controlled,IBS Diverticulosis    Endo/Other  Hypothyroidism Chronic lymphocytic thyroiditis Osteoporosis  Renal/GU   negative genitourinary   Musculoskeletal  (+) Fibromyalgia -Compression Fx L1   Abdominal   Peds  Hematology   Anesthesia Other Findings   Reproductive/Obstetrics                             Anesthesia Physical Anesthesia Plan  ASA: III  Anesthesia Plan: MAC   Post-op Pain Management:    Induction:   PONV Risk Score and Plan: 3 and Ondansetron, Treatment may vary due to age or medical condition and Propofol infusion  Airway Management Planned: Natural Airway and Nasal Cannula  Additional Equipment:   Intra-op Plan:   Post-operative Plan:   Informed Consent: I have reviewed the patients History and Physical,  chart, labs and discussed the procedure including the risks, benefits and alternatives for the proposed anesthesia with the patient or authorized representative who has indicated his/her understanding and acceptance.     Dental advisory given  Plan Discussed with: CRNA and Surgeon  Anesthesia Plan Comments:        Anesthesia Quick Evaluation

## 2019-04-03 NOTE — Anesthesia Postprocedure Evaluation (Signed)
Anesthesia Post Note  Patient: Tracy Malone  Procedure(s) Performed: KYPHOPLASTY L1 (N/A )     Patient location during evaluation: PACU Anesthesia Type: MAC Level of consciousness: awake and alert and oriented Pain management: pain level controlled Vital Signs Assessment: post-procedure vital signs reviewed and stable Respiratory status: spontaneous breathing, nonlabored ventilation and respiratory function stable Cardiovascular status: stable and blood pressure returned to baseline Postop Assessment: no apparent nausea or vomiting Anesthetic complications: no    Last Vitals:  Vitals:   04/03/19 1540 04/03/19 1555  BP: (!) 105/49 114/78  Pulse: 80 85  Resp: 15 17  Temp: 36.5 C   SpO2: 100% 99%    Last Pain:  Vitals:   04/03/19 1540  TempSrc:   PainSc: 5                  Lion Fernandez A.

## 2019-04-03 NOTE — Brief Op Note (Signed)
04/03/2019  3:26 PM  PATIENT:  Tracy Malone  85 y.o. female  PRE-OPERATIVE DIAGNOSIS:  Lumbar one osteoporitic compression fracture  POST-OPERATIVE DIAGNOSIS:  Lumbar one osteoporitic compression fracture  PROCEDURE:  Procedure(s) with comments: KYPHOPLASTY L1 (N/A) - 90 mins  SURGEON:  Surgeon(s) and Role:    Melina Schools, MD - Primary  PHYSICIAN ASSISTANT:   ASSISTANTS: Amanda Ward, PA   ANESTHESIA:   IV sedation  EBL:  none   BLOOD ADMINISTERED:none  DRAINS: none   LOCAL MEDICATIONS USED:  MARCAINE    and OTHER exparel  SPECIMEN:  No Specimen  DISPOSITION OF SPECIMEN:  N/A  COUNTS:  YES  TOURNIQUET:  * No tourniquets in log *  DICTATION: .Dragon Dictation  PLAN OF CARE: Admit for overnight observation  PATIENT DISPOSITION:  PACU - hemodynamically stable.

## 2019-04-03 NOTE — Anesthesia Procedure Notes (Signed)
Procedure Name: MAC Date/Time: 04/03/2019 2:40 PM Performed by: Scheryl Darter, CRNA Pre-anesthesia Checklist: Patient identified, Emergency Drugs available, Suction available and Patient being monitored Patient Re-evaluated:Patient Re-evaluated prior to induction Oxygen Delivery Method: Nasal cannula

## 2019-04-04 ENCOUNTER — Encounter (HOSPITAL_COMMUNITY): Payer: Self-pay | Admitting: Orthopedic Surgery

## 2019-04-04 DIAGNOSIS — Z79890 Hormone replacement therapy (postmenopausal): Secondary | ICD-10-CM | POA: Diagnosis not present

## 2019-04-04 DIAGNOSIS — M8008XA Age-related osteoporosis with current pathological fracture, vertebra(e), initial encounter for fracture: Secondary | ICD-10-CM | POA: Diagnosis not present

## 2019-04-04 DIAGNOSIS — E039 Hypothyroidism, unspecified: Secondary | ICD-10-CM | POA: Diagnosis not present

## 2019-04-04 DIAGNOSIS — G20 Parkinson's disease: Secondary | ICD-10-CM | POA: Diagnosis not present

## 2019-04-04 DIAGNOSIS — F418 Other specified anxiety disorders: Secondary | ICD-10-CM | POA: Diagnosis not present

## 2019-04-04 DIAGNOSIS — Z79899 Other long term (current) drug therapy: Secondary | ICD-10-CM | POA: Diagnosis not present

## 2019-04-04 NOTE — Evaluation (Signed)
Physical Therapy Evaluation Patient Details Name: Tracy Malone MRN: [id number] DOB: 1931/10/23 Today's Date: 04/04/2019   History of Present Illness  Patient is a 85 y/o female who presents s/p L1 kyphoplasty. PMH includes CAD, depression, fibromyalgia, PVCs, PD, migraines.  Clinical Impression  Patient presents with pain and post surgical deficits s/p above surgery. Pt Mod I PTA and uses RW for ambulation. Has support of spouse at home. 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 Today, pt requires Min guard-supervision for bed mobility, transfers and gait training. Needs cues for upright posture. Education re: back precautions, log roll technique, positioning, safety at home, and walking program. Will follow acutely to maximize independence and mobility prior to return home.     Follow Up Recommendations No PT follow up;Supervision - Intermittent    Equipment Recommendations  None recommended by PT    Recommendations for Other Services       Precautions / Restrictions Precautions Precautions: Fall;Back Precaution Booklet Issued: Yes (comment) Precaution Comments: Reviewed precautions and handout Restrictions Weight Bearing Restrictions: No      Mobility  Bed Mobility Overal bed mobility: Needs Assistance Bed Mobility: Rolling;Sidelying to Sit Rolling: Supervision Sidelying to sit: Supervision;HOB elevated       General bed mobility comments: Cues for log roll technique; increased time and effort but no assist needed. HOB slightly elevated.  Transfers Overall transfer level: Needs assistance Equipment used: Rolling walker (2 wheeled) Transfers: Sit to/from Stand Sit to Stand: Min guard         General transfer comment: Min guard for safety. Stood from [employer], transferred to chair post ambulation.  Ambulation/Gait Ambulation/Gait assistance: Supervision Gait Distance (Feet): 350 Feet Assistive device: Rolling walker (2 wheeled) Gait Pattern/deviations:  Step-through pattern;Decreased stride length;Trunk flexed Gait velocity: decreased   General Gait Details: Slow, mostly steady gait with cues for upright posture.  Stairs            Wheelchair Mobility    Modified Rankin (Stroke Patients Only)       Balance Overall balance assessment: Needs assistance Sitting-balance support: Feet supported;No upper extremity supported Sitting balance-Leahy Scale: Good     Standing balance support: During functional activity Standing balance-Leahy Scale: Fair Standing balance comment: Does better with UE support for standing.                             Pertinent Vitals/Pain Pain Assessment: 0-10 Pain Score: 2  Pain Location: back Pain Descriptors / Indicators: Sore Pain Intervention(s): Repositioned;Monitored during session    Home Living Family/patient expects to be discharged to:: Private residence   Available Help at Discharge: Family;Available 24 hours/day Type of Home: Independent living facility Home Access: Level entry     Home Layout: Two level;Able to live on main level with bedroom/bathroom Home Equipment: Shower seat - built in;Walker - 2 wheels;Grab bars - toilet;Grab bars - tub/shower      Prior Function Level of Independence: Independent with assistive device(s)         Comments: Uses RW for ambulation and reports doing ADLs independently. Likes to knit     Hand Dominance   Dominant Hand: Right    Extremity/Trunk Assessment   Upper Extremity Assessment Upper Extremity Assessment: (Intention tremor RUE)    Lower Extremity Assessment Lower Extremity Assessment: Overall WFL for tasks assessed(sensation WFLs)    Cervical / Trunk Assessment Cervical / Trunk Assessment: Other exceptions Cervical / Trunk Exceptions: s/p  back surgery  Communication   Communication: No difficulties  Cognition Arousal/Alertness: Awake/alert Behavior During Therapy: WFL for tasks assessed/performed Overall  Cognitive Status: Within Functional Limits for tasks assessed                                 General Comments: for basic mobility tasks.      General Comments      Exercises     Assessment/Plan    PT Assessment Patient needs continued PT services  PT Problem List Decreased mobility;Decreased balance;Pain;Decreased skin integrity;Decreased knowledge of precautions;Decreased activity tolerance       PT Treatment Interventions Therapeutic activities;Gait training;Therapeutic exercise;Patient/family education;Balance training;Functional mobility training;DME instruction    PT Goals (Current goals can be found in the Care Plan section)  Acute Rehab PT Goals Patient Stated Goal: to go home PT Goal Formulation: With patient Time For Goal Achievement: 04/18/19 Potential to Achieve Goals: Good    Frequency Min 5X/week   Barriers to discharge        Co-evaluation               AM-PAC PT "6 Clicks" Mobility  Outcome Measure Help needed turning from your back to your side while in a flat bed without using bedrails?: A Little Help needed moving from lying on your back to sitting on the side of a flat bed without using bedrails?: A Little Help needed moving to and from a bed to a chair (including a wheelchair)?: A Little Help needed standing up from a chair using your arms (e.g., wheelchair or bedside chair)?: A Little Help needed to walk in hospital room?: A Little Help needed climbing 3-5 steps with a railing? : A Little 6 Click Score: 18    End of Session Equipment Utilized During Treatment: Gait belt Activity Tolerance: Patient tolerated treatment well Patient left: in chair;with call bell/phone within reach Nurse Communication: Mobility status PT Visit Diagnosis: Unsteadiness on feet (R26.81);Difficulty in walking, not elsewhere classified (R26.2)    Time: [id number] PT Time Calculation (min) (ACUTE ONLY): 19 min   Charges:   PT Evaluation $PT  Eval Moderate Complexity: 1 Mod          Wray Kearns, PT, DPT Acute Rehabilitation Services Pager [phone] Office [phone]      Tracy Malone 04/04/2019, 8:46 AM

## 2019-04-04 NOTE — Evaluation (Signed)
Occupational Therapy Evaluation and Discharge Patient Details Name: Tracy Malone MRN: [id number] DOB: 13-Jul-1932 Today's Date: 04/04/2019    History of Present Illness Patient is a 85 y/o female who presents s/p L1 kyphoplasty. PMH includes CAD, depression, fibromyalgia, PVCs, PD, migraines.   Clinical Impression   All education completed. Pt has all necessary DME and AE. She will have assist of her husband and daughter at home. Pt verbalized and demonstrated understanding of back precautions.     Follow Up Recommendations  No OT follow up    Equipment Recommendations  None recommended by OT    Recommendations for Other Services       Precautions / Restrictions Precautions Precautions: Fall;Back Precaution Booklet Issued: Yes (comment) Precaution Comments: Reviewed precautions and handout Restrictions Weight Bearing Restrictions: No      Mobility Bed Mobility Overal bed mobility: Needs Assistance Bed Mobility: Rolling;Sit to Sidelying Rolling: Supervision      Sit to sidelying: Min assist General bed mobility comments: min assist for LEs into bed, cues for log roll technique  Transfers Overall transfer level: Needs assistance Equipment used: None Transfers: Sit to/from Stand Sit to Stand: Supervision         General transfer comment: supervision for safety to ambulate to bathroom and back to bed    Balance Overall balance assessment: Needs assistance Sitting-balance support: Feet supported;No upper extremity supported Sitting balance-Leahy Scale: Good     Standing balance support: During functional activity Standing balance-Leahy Scale: Fair Standing balance comment: Does better with UE support for standing.                           ADL either performed or assessed with clinical judgement   ADL                                         General ADL Comments: performed toileting, dressing with min assist and grooming with  supervision for safety     Vision Baseline Vision/History: Wears glasses       Perception     Praxis      Pertinent Vitals/Pain Pain Assessment: Faces Pain Score: 2  Faces Pain Scale: Hurts even more Pain Location: back Pain Descriptors / Indicators: Sore Pain Intervention(s): Monitored during session     Hand Dominance Right   Extremity/Trunk Assessment Upper Extremity Assessment Upper Extremity Assessment: Overall WFL for tasks assessed(tremor)   Lower Extremity Assessment Lower Extremity Assessment: Defer to PT evaluation   Cervical / Trunk Assessment Cervical / Trunk Assessment: Other exceptions Cervical / Trunk Exceptions: s/p back surgery   Communication Communication Communication: No difficulties   Cognition Arousal/Alertness: Awake/alert Behavior During Therapy: WFL for tasks assessed/performed Overall Cognitive Status: Within Functional Limits for tasks assessed                                 General Comments: for basic mobility tasks.   General Comments       Exercises     Shoulder Instructions      Home Living Family/patient expects to be discharged to:: Private residence Living Arrangements: Spouse/significant other Available Help at Discharge: Family;Available 24 hours/day Type of Home: Independent living facility Home Access: Level entry     Home Layout: Two level;Able to live on main level with bedroom/bathroom  Bathroom Shower/Tub: Occupational psychologist: Standard Bathroom Accessibility: Yes   Home Equipment: Shower seat - built in;Walker - 2 wheels;Grab bars - toilet;Grab bars - tub/shower;Adaptive equipment Adaptive Equipment: Reacher;Sock aid;Long-handled sponge        Prior Functioning/Environment Level of Independence: Independent with assistive device(s)        Comments: Uses RW for ambulation and reports doing ADLs independently. Likes to knit        OT Problem List:        OT  Treatment/Interventions:      OT Goals(Current goals can be found in the care plan section) Acute Rehab OT Goals Patient Stated Goal: to go home  OT Frequency:     Barriers to D/C:            Co-evaluation              AM-PAC OT "6 Clicks" Daily Activity     Outcome Measure Help from another person eating meals?: None Help from another person taking care of personal grooming?: A Little Help from another person toileting, which includes using toliet, bedpan, or urinal?: None Help from another person bathing (including washing, rinsing, drying)?: A Little Help from another person to put on and taking off regular upper body clothing?: None Help from another person to put on and taking off regular lower body clothing?: A Little 6 Click Score: 21   End of Session Equipment Utilized During Treatment: Gait belt  Activity Tolerance: Patient tolerated treatment well Patient left: in bed;with call bell/phone within reach  OT Visit Diagnosis: Other abnormalities of gait and mobility (R26.89);Pain                Time: [id number] OT Time Calculation (min): 39 min Charges:  OT General Charges $OT Visit: 1 Visit OT Evaluation $OT Eval Moderate Complexity: 1 Mod OT Treatments $Self Care/Home Management : 23-37 mins  Malka So 04/04/2019, 10:37 AM  Nestor Lewandowsky, OTR/L Acute Rehabilitation Services Pager: [phone] Office: [phone]

## 2019-04-04 NOTE — Progress Notes (Signed)
Patient is discharged from room 3C11 at this time. Alert and in stable condition. IV site d/c'd and instructions read to patient with understanding verbalized. Left unit via wheelchair with all belongings at side. 

## 2019-04-04 NOTE — Progress Notes (Signed)
    Subjective: Procedure(s) (LRB): KYPHOPLASTY L1 (N/A) 1 Day Post-Op  Patient reports pain as 1 on 0-10 scale.  Reports none leg pain denies incisional back pain   Positive void Negative bowel movement Positive flatus Negative chest pain or shortness of breath  Objective: Vital signs in last 24 hours: Temp:  [97.7 F (36.5 C)-98.6 F (37 C)] 98 F (36.7 C) (09/11 0407) Pulse Rate:  [76-103] 76 (09/11 0407) Resp:  [15-18] 18 (09/11 0407) BP: (101-172)/(42-84) 101/42 (09/11 0407) SpO2:  [94 %-100 %] 96 % (09/11 0407) Weight:  [46 kg] 46 kg (09/10 1133)  Intake/Output from previous day: 09/10 0701 - 09/11 0700 In: 475 [P.O.:75; I.V.:300; IV Piggyback:100] Out: -   Labs: Recent Labs    04/02/19 1346  WBC 10.4  RBC 4.97  HCT 46.9*  PLT 313   Recent Labs    04/02/19 1346  NA 136  K 3.7  CL 101  CO2 27  BUN 9  CREATININE 0.51  GLUCOSE 112*  CALCIUM 9.4   Recent Labs    04/02/19 1346  INR 1.0    Physical Exam: Neurologically intact ABD soft Intact pulses distally Dorsiflexion/Plantar flexion intact Incision: dressing C/D/I and no drainage Compartment soft Body mass index is 20.5 kg/m.   Assessment/Plan: Patient stable  xrays n/a Continue mobilization with physical therapy Continue care  Advance diet Up with therapy  Doing well Ambulating without significant pain Plan on d/c to home - f/u in 2 weeks  Melina Schools, MD [facility] [phone]

## 2019-04-08 ENCOUNTER — Emergency Department (HOSPITAL_COMMUNITY)
Admission: EM | Admit: 2019-04-08 | Discharge: 2019-04-08 | Disposition: A | Discharge status: Home / Self Care | Payer: Medicare Other | Attending: Emergency Medicine | Admitting: Emergency Medicine

## 2019-04-08 ENCOUNTER — Encounter (HOSPITAL_COMMUNITY): Payer: Self-pay

## 2019-04-08 ENCOUNTER — Emergency Department (HOSPITAL_COMMUNITY): Payer: Medicare Other

## 2019-04-08 ENCOUNTER — Other Ambulatory Visit: Payer: Self-pay

## 2019-04-08 DIAGNOSIS — R110 Nausea: Secondary | ICD-10-CM | POA: Diagnosis not present

## 2019-04-08 DIAGNOSIS — K5900 Constipation, unspecified: Secondary | ICD-10-CM | POA: Insufficient documentation

## 2019-04-08 DIAGNOSIS — R1084 Generalized abdominal pain: Secondary | ICD-10-CM | POA: Diagnosis not present

## 2019-04-08 DIAGNOSIS — Z79899 Other long term (current) drug therapy: Secondary | ICD-10-CM | POA: Insufficient documentation

## 2019-04-08 DIAGNOSIS — G20 Parkinson's disease: Secondary | ICD-10-CM | POA: Diagnosis not present

## 2019-04-08 DIAGNOSIS — R109 Unspecified abdominal pain: Secondary | ICD-10-CM | POA: Diagnosis present

## 2019-04-08 DIAGNOSIS — R52 Pain, unspecified: Secondary | ICD-10-CM | POA: Diagnosis not present

## 2019-04-08 LAB — CBC WITH DIFFERENTIAL/PLATELET
Abs Immature Granulocytes: 0.02 10*3/uL (ref 0.00–0.07)
Basophils Absolute: 0.1 10*3/uL (ref 0.0–0.1)
Basophils Relative: 1 %
Eosinophils Absolute: 0.3 10*3/uL (ref 0.0–0.5)
Eosinophils Relative: 4 %
HCT: 46 % (ref 36.0–46.0)
Hemoglobin: 14.8 g/dL (ref 12.0–15.0)
Immature Granulocytes: 0 %
Lymphocytes Relative: 29 %
Lymphs Abs: 2.4 10*3/uL (ref 0.7–4.0)
MCH: 29.5 pg (ref 26.0–34.0)
MCHC: 32.2 g/dL (ref 30.0–36.0)
MCV: 91.8 fL (ref 80.0–100.0)
Monocytes Absolute: 0.9 10*3/uL (ref 0.1–1.0)
Monocytes Relative: 11 %
Neutro Abs: 4.5 10*3/uL (ref 1.7–7.7)
Neutrophils Relative %: 55 %
Platelets: 341 10*3/uL (ref 150–400)
RBC: 5.01 MIL/uL (ref 3.87–5.11)
RDW: 14.2 % (ref 11.5–15.5)
WBC: 8.2 10*3/uL (ref 4.0–10.5)
nRBC: 0 % (ref 0.0–0.2)

## 2019-04-08 LAB — COMPREHENSIVE METABOLIC PANEL
ALT: 22 U/L (ref 0–44)
AST: 29 U/L (ref 15–41)
Albumin: 4 g/dL (ref 3.5–5.0)
Alkaline Phosphatase: 111 U/L (ref 38–126)
Anion gap: 11 (ref 5–15)
BUN: 10 mg/dL (ref 8–23)
CO2: 25 mmol/L (ref 22–32)
Calcium: 9.5 mg/dL (ref 8.9–10.3)
Chloride: 106 mmol/L (ref 98–111)
Creatinine, Ser: 0.59 mg/dL (ref 0.44–1.00)
GFR calc Af Amer: >60 mL/min (ref >60)
GFR calc non Af Amer: >60 mL/min (ref >60)
Glucose, Bld: 98 mg/dL (ref 70–99)
Potassium: 3.7 mmol/L (ref 3.5–5.1)
Sodium: 142 mmol/L (ref 135–145)
Total Bilirubin: 0.7 mg/dL (ref 0.3–1.2)
Total Protein: 7.2 g/dL (ref 6.5–8.1)

## 2019-04-08 LAB — LIPASE, BLOOD: Lipase: 43 U/L (ref 11–51)

## 2019-04-08 MED ORDER — ONDANSETRON HCL 4 MG/2ML IJ SOLN
4.0000 mg | Freq: Once | INTRAMUSCULAR | Status: AC
  Administered 2019-04-08: 4 mg via INTRAVENOUS
  Filled 2019-04-08: qty 2

## 2019-04-08 MED ORDER — ACETAMINOPHEN 325 MG PO TABS
650.0000 mg | ORAL_TABLET | Freq: Once | ORAL | Status: DC
  Filled 2019-04-08: qty 2

## 2019-04-08 MED ORDER — ALPRAZOLAM 0.5 MG PO TABS
0.5000 mg | ORAL_TABLET | Freq: Once | ORAL | Status: AC
  Administered 2019-04-08: 0.5 mg via ORAL
  Filled 2019-04-08: qty 1

## 2019-04-08 NOTE — ED Notes (Signed)
EDP made aware of patient's reported discomfort

## 2019-04-08 NOTE — ED Provider Notes (Signed)
85yo female with kyphoplasty at L1. No bladder changes, normal neuro exam. Constipation and abdominal pain, drinking contrast.  Follow up GI Thursday. Not on narcotics, taking colace and suppositories.  Physical Exam  BP (!) 163/94   Pulse 94   Temp 97.7 F (36.5 C) (Oral)   Resp 17   LMP  (LMP Unknown)   SpO2 99%   Physical Exam Well appearing, pleasant female, comfortable at this time. ED Course/Procedures   Clinical Course as of Apr 07 701  Tue Apr 08, 2019  0508 RN states patient declining tylenol and requesting "something for anxiety."   [SJ]    Clinical Course User Index [SJ] Joy, Shawn C, PA-C    Procedures  MDM  Reivewed labs and CT with patient, patient prefers dietary changes to help with her constipation. Patient plans to drink warm prune juice, will increase her miralax. Patient is to follow up with GI later this week, return to ER for fevers, worsening pain, other concerns.       Tacy Learn, PA-C 04/08/19 ID:[id number]    Lacretia Leigh, MD 04/08/19 [phone]

## 2019-04-08 NOTE — ED Notes (Signed)
[place]

## 2019-04-08 NOTE — ED Notes (Signed)
Patient is moaning and states she "is uncomfortable and wants her CT scan" Patient has a lengthy list of medications she does not take and was drinking oral contrast at 0600.

## 2019-04-08 NOTE — ED Notes (Signed)
Patient ambulated to bathroom with RN assistance. Patient steady and needed minimal help with getting out of bed. Patient told RN to "throw away panties" as she had "wet herself while walking" RN asked patient what she does at home, pt reports "Well I walk to the bathroom." PA made aware.

## 2019-04-08 NOTE — ED Notes (Signed)
Pt finished drinking all the CT contrast. No reports of emesis

## 2019-04-08 NOTE — ED Provider Notes (Signed)
[facility] DEPT Provider Note   CSN: [id number] Arrival date & time: 04/08/19  0411     History   Chief Complaint Chief Complaint  Patient presents with  . Abdominal Pain    HPI Tracy Malone is a 85 y.o. female.     HPI   Tracy Malone is a 85 y.o. female, with a history of Bell's palsy, chronic abdominal pain, ischemic colitis, Parkinson's disease, presenting to the ED with abdominal pain for the past 4 days.  Pain is in the upper and left abdomen, "feels like a pressure or innertube inflated in the abdomen," constant, 5/10, radiating throughout the abdomen. Patient states she underwent kyphoplasty 4 days ago.  Since that time she has not been able to have a bowel movement.  She adds she was already having difficulty with constipation prior to the surgery, but it has gotten worse and is now accompanied by pain. Also notes nausea and loss of appetite.  She has been taking Colace and has tried laxatives suppositories. She is still passing gas. Denies fever/chills, vomiting, diarrhea, rectal bleeding, urinary symptoms, changes in bladder function, numbness, weakness, syncope, or any other complaints.   Past Medical History:  Diagnosis Date  . Bell's palsy 1966   Hx: right side facial droop, resolved per patient 04/02/19  . Carotid artery disease ([place]) 2010   on vascular screening;unchanged 2013.(could not tolerate simvastatin, no other statins tried)--<30% blockage bilat 07/2011  . Chronic abdominal pain   . Chronic fatigue and malaise   . Claustrophobia   . Depression    treated in the past for years;stopped in 2010 for a years  . Duodenal ulcer 1962   h/o  . Dysrhythmia    ocassional PVC's, no current problems per patient on 04/02/19  . Fibromyalgia   . Frequent PVCs 07/2012   Seen by [facility]: benign, asymptomatic, normal EF  . GERD (gastroesophageal reflux disease)    diet controlled  . Glaucoma, narrow-angle    s/p laser surgery   . History of hiatal hernia    during endoscopy  . Hypothyroid 9/08  . IBS (irritable bowel syndrome)    Dr. Benson [place]  . Ischemic colitis ([place]) 11/21/2018   no current problems per patient on 04/02/19  . Ocular migraine   . Osteoporosis 10/11   Dr.Hawkes  . Panic attack   . Parkinson disease ([place])   . Recurrent UTI    has cystocele-Dr.Grewal  . Shingles 1999   h/o  . Superficial thrombophlebitis 03/2009   RLE  . Trochanteric bursitis 12/2008   bilateral    Patient Active Problem List   Diagnosis Date Noted  . Lumbar compression fracture ([place]) 04/03/2019  . Compression fracture of L1 lumbar vertebra ([place]) 03/16/2019  . Osteoporosis of lumbar spine 03/16/2019  . Essential tremor 02/06/2019  . Ischemic colitis ([place])   . Leukocytosis   . Acute colitis 11/22/2018  . Colitis 11/22/2018  . Acute lower UTI 11/22/2018  . Acute cystitis without hematuria   . Parkinson's disease ([place]) 10/24/2018  . Gait disturbance 11/28/2017  . Chronic lymphocytic thyroiditis 04/24/2017  . Status post removal of thyroid nodule 04/24/2017  . Depression with anxiety 03/16/2017  . Subcutaneous nodules 03/16/2017  . Aortic atherosclerosis ([place]) 11/24/2016  . Allergy to multiple antibiotics 10/07/2016  . Seafood allergy, anaphylaxis, subsequent encounter 10/07/2016  . Helicobacter pylori gastritis 10/07/2016  . Fall 09/05/2016  . Bloating 08/15/2016  . Severe recurrent major depression without psychotic features ([place]) 04/13/2016  Class: Chronic  . Rash and nonspecific skin eruption 03/01/2016  . Leg pain, bilateral 03/01/2016  . Functional dyspepsia 10/13/2015  . Subacromial bursitis 03/02/2015  . Resting tremor 02/01/2015  . Epigastric fullness 10/12/2014  . Early satiety 10/12/2014  . Cystocele 12/30/2013  . IBS (irritable bowel syndrome) 09/30/2013  . GERD (gastroesophageal reflux disease) 06/27/2013  . PVC's (premature ventricular contractions) 10/02/2012  . Depressive disorder, not  elsewhere classified 10/02/2012  . Bradycardia 08/01/2012  . Fatigue 08/01/2012  . Anxiety state 05/13/2012  . Osteopenia 05/13/2012  . Hypothyroidism 05/13/2012    Past Surgical History:  Procedure Laterality Date  . ABDOMINAL HYSTERECTOMY    . CATARACT EXTRACTION, BILATERAL  1995, 1996  . EYE SURGERY Bilateral    laser - glaucoma  . Flexible sigmoidoscopy    . KYPHOPLASTY N/A 04/03/2019   Procedure: KYPHOPLASTY L1;  Surgeon: Melina Schools, MD;  Location: [facility];  Service: Orthopedics;  Laterality: N/A;  90 mins  . THYROIDECTOMY, PARTIAL  09/2005   L nodule; Dr. Harlow Asa  . TONSILLECTOMY  1946  . UPPER GI ENDOSCOPY  06/27/12  . VAGINAL HYSTERECTOMY  1971   and bladder repair.  Still has ovaries  . WISDOM TOOTH EXTRACTION       OB History    Gravida  2   Para  2   Term      Preterm      AB      Living  2     SAB      TAB      Ectopic      Multiple      Live Births               Home Medications    Prior to Admission medications   Medication Sig Start Date End Date Taking? Authorizing Provider  acetaminophen (TYLENOL) 500 MG tablet Take 500-1,000 mg by mouth 3 (three) times daily as needed (pain.).    Yes [provider]  ALPRAZolam (XANAX) 0.5 MG tablet TAKE BY MOUTH UP TO 3 TIMES DAILY. WARNING: BENZODIAZEPINES INCREASE THE RISK OF FALLS AND NEUROCOGNITIVE DECLINE IN THE ELDERLY Patient taking differently: Take 0.5-1 mg by mouth See admin instructions. Take 1 tablet (0.5 mg) by mouth twice daily & take 2 tablets(1 mg) by mouth in the evening. 02/18/18  Yes Eksir, Richard Miu, MD  calcium carbonate (OSCAL) 1500 (600 Ca) MG TABS tablet Take 600 mg by mouth 2 (two) times daily.   Yes [provider]  Carbidopa-Levodopa ER (SINEMET CR) 25-100 MG tablet controlled release Take 1 tablet by mouth 4 (four) times daily. Patient taking differently: Take 1 tablet by mouth 3 (three) times daily before meals.  10/24/18  Yes Tat, Eustace Quail, DO   Cholecalciferol (VITAMIN D) 2000 UNITS tablet Take 2,000 Units by mouth daily.   Yes [provider]  docusate sodium (COLACE) 100 MG capsule Take 100 mg by mouth 2 (two) times daily.   Yes [provider]  polyethylene glycol (MIRALAX / GLYCOLAX) 17 g packet Take 17 g by mouth daily.   Yes [provider]  Probiotic Product (ALIGN PO) Take 1 capsule by mouth daily.    Yes [provider]  SYNTHROID 25 MCG tablet TAKE 1 TABLET DAILY BEFORE BREAKFAST Patient taking differently: Take 25 mcg by mouth daily before breakfast.  09/30/18  Yes [name], MD  dicyclomine (BENTYL) 10 MG capsule Take 1 capsule (10 mg total) by mouth every 6 (six) hours as needed  for spasms. Patient not taking: Reported on 03/06/2019 02/05/19   Milus Banister, MD    Family History Family History  Problem Relation Age of Onset  . Heart disease Mother   . Hypertension Mother   . Hypertension Sister   . HIV Son   . Heart disease Brother   . Lung cancer Brother        lung  . Diabetes Maternal Grandfather   . Diabetes Granddaughter        type 1    Social History Social History   Tobacco Use  . Smoking status: Never Smoker  . Smokeless tobacco: Never Used  Substance Use Topics  . Alcohol use: No    Alcohol/week: 0.0 standard drinks  . Drug use: No     Allergies   Iodine, Levsin [hyoscyamine sulfate], Salmon [fish allergy], Shellfish allergy, Remeron [mirtazapine], Aspirin, Ciprofloxacin, Codeine, Darvocet [propoxyphene n-acetaminophen], Demerol  [meperidine hcl], Demerol [meperidine], Dexilant [dexlansoprazole], Diphedryl [diphenhydramine], Doxycycline hyclate, Epinephrine, Erythromycin, Flexeril [cyclobenzaprine], Keflex [cephalexin], Latex, Nitrofurantoin, Other, Prednisone, Pylera [bis subcit-metronid-tetracyc], Sulfa antibiotics, Xylocaine [lidocaine hcl], Zoloft [sertraline hcl], Advil [ibuprofen], and Clarithromycin   Review of Systems Review of Systems   Constitutional: Negative for chills, diaphoresis and fever.  Respiratory: Negative for shortness of breath.   Cardiovascular: Negative for chest pain.  Gastrointestinal: Positive for abdominal pain, constipation and nausea. Negative for blood in stool and vomiting.  Genitourinary: Negative for dysuria, flank pain, frequency and hematuria.  Musculoskeletal: Negative for back pain.  Neurological: Negative for syncope.  All other systems reviewed and are negative.    Physical Exam Updated Vital Signs BP (!) 166/80 (BP Location: Left Arm)   Pulse 88   Temp 97.7 F (36.5 C) (Oral)   Resp 19   LMP  (LMP Unknown)   SpO2 100%   Physical Exam Vitals signs and nursing note reviewed.  Constitutional:      General: She is not in acute distress.    Appearance: She is well-developed. She is not diaphoretic.  HENT:     Head: Normocephalic and atraumatic.     Mouth/Throat:     Mouth: Mucous membranes are moist.     Pharynx: Oropharynx is clear.  Eyes:     Conjunctiva/sclera: Conjunctivae normal.  Neck:     Musculoskeletal: Neck supple.  Cardiovascular:     Rate and Rhythm: Normal rate and regular rhythm.     Pulses: Normal pulses.          Radial pulses are 2+ on the right side and 2+ on the left side.       Posterior tibial pulses are 2+ on the right side and 2+ on the left side.     Heart sounds: Normal heart sounds.     Comments: Tactile temperature in the extremities appropriate and equal bilaterally. Pulmonary:     Effort: Pulmonary effort is normal. No respiratory distress.     Breath sounds: Normal breath sounds.  Abdominal:     Palpations: Abdomen is soft.     Tenderness: There is abdominal tenderness. There is guarding.    Musculoskeletal:     Right lower leg: No edema.     Left lower leg: No edema.  Lymphadenopathy:     Cervical: No cervical adenopathy.  Skin:    General: Skin is warm and dry.  Neurological:     Mental Status: She is alert.     Deep Tendon  Reflexes:     Reflex Scores:      Patellar  reflexes are 2+ on the right side and 2+ on the left side.    Comments: Sensation grossly intact to light touch in the lower extremities bilaterally. No saddle anesthesias. Strength 5/5 in the bilateral lower extremities.  Psychiatric:        Mood and Affect: Mood and affect normal.        Speech: Speech normal.        Behavior: Behavior normal.      ED Treatments / Results  Labs (all labs ordered are listed, but only abnormal results are displayed) Labs Reviewed  COMPREHENSIVE METABOLIC PANEL  LIPASE, BLOOD  CBC WITH DIFFERENTIAL/PLATELET    EKG None  Radiology No results found.  Procedures Procedures (including critical care time)  Medications Ordered in ED Medications  ondansetron (ZOFRAN) injection 4 mg (4 mg Intravenous Given 04/08/19 0514)  ALPRAZolam Duanne Moron) tablet 0.5 mg (0.5 mg Oral Given 04/08/19 0521)     Initial Impression / Assessment and Plan / ED Course  I have reviewed the triage vital signs and the nursing notes.  Pertinent labs & imaging results that were available during my care of the patient were reviewed by me and considered in my medical decision making (see chart for details).  Clinical Course as of Apr 07 725  Tue Apr 08, 2019  0508 RN states patient declining tylenol and requesting "something for anxiety."   [SJ]    Clinical Course User Index [SJ] Jaecion Dempster C, PA-C       Patient presents with abdominal pain and constipation. Patient is nontoxic appearing, afebrile, not tachycardic, not tachypneic, not hypotensive, maintains excellent SPO2 on room air. Lab work unremarkable.  Findings and plan of care discussed with Merrily Pew, MD.   End of shift patient care handoff report given to Suella Broad, PA-C. Plan: Patient awaiting CT of abdomen/pelvis.   Vitals:   04/08/19 0422 04/08/19 0425 04/08/19 0530  BP:  (!) 166/80 (!) 163/94  Pulse:  88 94  Resp:  19 17  Temp:  97.7 F (36.5 C)    TempSrc:  Oral   SpO2: 98% 100% 99%     Final Clinical Impressions(s) / ED Diagnoses   Final diagnoses:  None    ED Discharge Orders    None       Layla Maw 04/08/19 [id number]    Mesner, Corene Cornea, MD 04/08/19 0730

## 2019-04-08 NOTE — ED Notes (Signed)
Pt states her back is still hurting. She just had back surgery 4 days ago. This Probation officer noticed patient starting shaking. Pt stated she has anxiety and shakes from her anxiety when she has not taken her medicine. Pt states she is in a lot of pain.

## 2019-04-08 NOTE — ED Notes (Signed)
Patient given water. PA at bedside

## 2019-04-08 NOTE — Discharge Instructions (Addendum)
Follow up with your GI as scheduled. Return to ER for fevers, new or worsening symptoms.

## 2019-04-08 NOTE — ED Triage Notes (Signed)
Per ems: Pt coming from home c/o abdominal pain/constipation/ bloating x1 month. Pt had back surgery on 04/03/19 and didn't take narcotics. Tried enemas at home without relief. A&Ox4 and ambulatory with assistance. Soft nontender in all 4 quadrants.    142/88 76 hr 18 rr 98% room air 98.1 temp

## 2019-04-09 ENCOUNTER — Telehealth: Payer: Self-pay | Admitting: Internal Medicine

## 2019-04-09 ENCOUNTER — Telehealth: Payer: Self-pay | Admitting: Licensed Clinical Social Worker

## 2019-04-09 DIAGNOSIS — F410 Panic disorder [episodic paroxysmal anxiety] without agoraphobia: Secondary | ICD-10-CM | POA: Diagnosis not present

## 2019-04-09 DIAGNOSIS — F5101 Primary insomnia: Secondary | ICD-10-CM | POA: Diagnosis not present

## 2019-04-09 DIAGNOSIS — F332 Major depressive disorder, recurrent severe without psychotic features: Secondary | ICD-10-CM | POA: Diagnosis not present

## 2019-04-09 NOTE — Telephone Encounter (Signed)
Palliative Care SW made a follow up phone call to patient and her husband, Gwyndolyn Saxon, but there was no answer.

## 2019-04-09 NOTE — Telephone Encounter (Signed)
Returned patient's phone call regarding [phone] an appointment, per patient's request 09/23 appointment has been cancelled and she will call when she's ready to reschedule.

## 2019-04-10 NOTE — Discharge Summary (Signed)
Patient ID: Tracy Malone MRN: [id number] DOB/AGE: [birth year] 85 y.o.  Admit date: 04/03/2019 Discharge date: 04/10/2019  Admission Diagnoses:  Active Problems:   Lumbar compression fracture [facility])   Discharge Diagnoses:  Active Problems:   Lumbar compression fracture ([facility])  status post Procedure(s): KYPHOPLASTY L1  Past Medical History:  Diagnosis Date   Bell's palsy 1966   Hx: right side facial droop, resolved per patient 04/02/19   Carotid artery disease ([facility]) 2010   on vascular screening;unchanged 2013.(could not tolerate simvastatin, no other statins tried)--<30% blockage bilat 07/2011   Chronic abdominal pain    Chronic fatigue and malaise    Claustrophobia    Depression    treated in the past for years;stopped in 2010 for a years   Duodenal ulcer 1962   h/o   Dysrhythmia    ocassional PVC's, no current problems per patient on 04/02/19   Fibromyalgia    Frequent PVCs 07/2012   Seen by [facility]: benign, asymptomatic, normal EF   GERD (gastroesophageal reflux disease)    diet controlled   Glaucoma, narrow-angle    s/p laser surgery   History of hiatal hernia    during endoscopy   Hypothyroid 9/08   IBS (irritable bowel syndrome)    Dr. Elnoria Howard   Ischemic colitis ([facility]) 11/21/2018   no current problems per patient on 04/02/19   Ocular migraine    Osteoporosis 10/11   Dr.Hawkes   Panic attack    Parkinson disease ([facility])    Recurrent UTI    has cystocele-Dr.Grewal   Shingles 1999   h/o   Superficial thrombophlebitis 03/2009   RLE   Trochanteric bursitis 12/2008   bilateral    Surgeries: Procedure(s): KYPHOPLASTY L1 on 04/03/2019   Consultants:   Discharged Condition: Improved  Hospital Course: Tracy Malone is an 85 y.o. female who was admitted 04/03/2019 for operative treatment of L1 compression fracture. Patient failed conservative treatments (please see the history and physical for the specifics) and had severe unremitting pain  that affects sleep, daily activities and work/hobbies. After pre-op clearance, the patient was taken to the operating room on 04/03/2019 and underwent  Procedure(s): KYPHOPLASTY L1.    Patient was given perioperative antibiotics:  Anti-infectives (From admission, onward)   Start     Dose/Rate Route Frequency Ordered Stop   04/04/19 0130  vancomycin (VANCOCIN) 500 mg in sodium chloride 0.9 % 100 mL IVPB  Status:  Discontinued     500 mg 100 mL/hr over 60 Minutes Intravenous Every 24 hours 04/03/19 1708 04/04/19 1421   04/03/19 1215  vancomycin (VANCOCIN) IVPB 1000 mg/200 mL premix     1,000 mg 200 mL/hr over 60 Minutes Intravenous To Short Stay 04/03/19 1201 04/03/19 1735       Patient was given sequential compression devices and early ambulation to prevent DVT.   Patient benefited maximally from hospital stay and there were no complications. At the time of discharge, the patient was urinating/moving their bowels without difficulty, tolerating a regular diet, pain is controlled with oral pain medications and they have been cleared by PT/OT.   Recent vital signs: No data found.   Recent laboratory studies:  Recent Labs    04/08/19 0516  WBC 8.2  HGB 14.8  HCT 46.0  PLT 341  NA 142  K 3.7  CL 106  CO2 25  BUN 10  CREATININE 0.59  GLUCOSE 98  CALCIUM 9.5     Discharge Medications:   Allergies as of 04/04/2019  Reactions   Iodine Anaphylaxis   IV and topical forms.   Levsin [hyoscyamine Sulfate]    Vision problems/pt has glaucoma   Salmon [fish Allergy] Hives, Shortness Of Breath   Shellfish Allergy Anaphylaxis   Remeron [mirtazapine] Other (See Comments)   Cause blurred vision and red eyes, pt has glaucoma   Aspirin Other (See Comments)   Sever stomach pain due to ulcer scaring.   Ciprofloxacin Diarrhea   Codeine Nausea And Vomiting   Darvocet [propoxyphene N-acetaminophen] Nausea And Vomiting   Demerol  [meperidine Hcl]    Demerol [meperidine] Nausea Only    Dexilant [dexlansoprazole] Swelling   Redness, swelling and peeling of both feet.   Diphedryl [diphenhydramine] Other (See Comments)   Increased pulse/small amount ok   Doxycycline Hyclate Other (See Comments)   GI intolerance.   Epinephrine Other (See Comments)   Breathing problems   Erythromycin Other (See Comments)   GI intolerance.   Flexeril [cyclobenzaprine] Other (See Comments)   Tingly/prickly sensation.   Keflex [cephalexin] Hives   Latex Other (See Comments)   Gloves ok.  Skin gets red from elastic in underwear and latex bandaides.   Nitrofurantoin Diarrhea   Other    Prednisone Other (See Comments)   Headache   Pylera [bis Subcit-metronid-tetracyc] Swelling   Tongue swelling. Face tingling   Sulfa Antibiotics Other (See Comments)   Increased pulse, fainting, diarrhea, thrush   Xylocaine [lidocaine Hcl]    With epinephrine, given by dentist.  Speeded up heart rate and she passed out (occured twice, at dentist)   Sharman Crate Hcl] Swelling, Other (See Comments)   Migraine Swelling of tongue/lip (09/2012)   Advil [ibuprofen] Other (See Comments)   Motrin ok with a GI effect.   Clarithromycin Rash   Started after completing 10 day course of 2000 mg /day      Medication List    STOP taking these medications   ciprofloxacin 250 MG tablet Commonly known as: CIPRO   diclofenac 75 MG EC tablet Commonly known as: VOLTAREN   metoprolol tartrate 25 MG tablet Commonly known as: LOPRESSOR     TAKE these medications   acetaminophen 500 MG tablet Commonly known as: TYLENOL Take 500-1,000 mg by mouth 3 (three) times daily as needed (pain.).   ALIGN PO Take 1 capsule by mouth daily.   ALPRAZolam 0.5 MG tablet Commonly known as: XANAX TAKE BY MOUTH UP TO 3 TIMES DAILY. WARNING: BENZODIAZEPINES INCREASE THE RISK OF FALLS AND NEUROCOGNITIVE DECLINE IN THE ELDERLY What changed:   how much to take  how to take this  when to take this  additional  instructions   calcium carbonate 1500 (600 Ca) MG Tabs tablet Commonly known as: OSCAL Take 600 mg by mouth 2 (two) times daily.   Carbidopa-Levodopa ER 25-100 MG tablet controlled release Commonly known as: SINEMET CR Take 1 tablet by mouth 4 (four) times daily. What changed: when to take this   dicyclomine 10 MG capsule Commonly known as: BENTYL Take 1 capsule (10 mg total) by mouth every 6 (six) hours as needed for spasms.   Synthroid 25 MCG tablet Generic drug: levothyroxine TAKE 1 TABLET DAILY BEFORE BREAKFAST What changed: how much to take   Vitamin D 50 MCG (2000 UT) tablet Take 2,000 Units by mouth daily.       Diagnostic Studies: Ct Abdomen Pelvis Wo Contrast  Result Date: 04/08/2019 CLINICAL DATA:  Abdominal pain, constipation, bloating x4 days. Prior kyphoplasty on 04/03/2019. EXAM: CT ABDOMEN AND PELVIS  WITHOUT CONTRAST TECHNIQUE: Multidetector CT imaging of the abdomen and pelvis was performed following the standard protocol without IV contrast. COMPARISON:  02/25/2019. FINDINGS: Lower chest: Lung bases are clear. Hepatobiliary: Unenhanced liver is unremarkable. Gallbladder is unremarkable. No intrahepatic or extrahepatic ductal dilatation. Pancreas: Within normal limits. Spleen: Within normal limits. Adrenals/Urinary Tract: Adrenal glands are within normal limits. Kidneys are within normal limits.  No hydronephrosis. Distended bladder. Stomach/Bowel: Stomach is within normal limits. No evidence of bowel obstruction. Appendix is not discretely visualized. No colonic wall thickening or mass is evident on CT. Vascular/Lymphatic: No evidence of abdominal aortic aneurysm. Atherosclerotic calcifications of the abdominal aorta and branch vessels. No suspicious abdominopelvic lymphadenopathy. Reproductive: Status post hysterectomy. No adnexal masses. Other: No abdominopelvic ascites. Musculoskeletal: Moderate compression fracture deformity at L1 with prior vertebral augmentation.  IMPRESSION: No CT findings to account for the patient's abdominal pain. Moderate compression fracture deformity at L1 with prior vertebral augmentation. Electronically Signed   By: Charline Bills M.D.   On: 04/08/2019 08:19   Dg Chest 2 View  Result Date: 04/03/2019 CLINICAL DATA:  Preop kyphoplasty L1. EXAM: CHEST - 2 VIEW COMPARISON:  11/05/2017 FINDINGS: Lungs are adequately inflated and otherwise clear. Cardiomediastinal silhouette is within normal. Evidence of patient's known moderate L1 compression fracture new since the previous exam. IMPRESSION: No active cardiopulmonary disease. Known moderate L1 compression fracture new since the previous exam. Electronically Signed   By: Elberta Fortis M.D.   On: 04/03/2019 08:22   Dg Lumbar Spine 2-3 Views  Result Date: 04/03/2019 CLINICAL DATA:  L1 kyphoplasty EXAM: LUMBAR SPINE - 2-3 VIEW; DG C-ARM 1-60 MIN COMPARISON:  11/28/2010. Abdomen pelvis CT dated 02/25/2019. Chest radiographs dated 04/02/2019 FINDINGS: A single PA C-arm view of the thoracolumbar junction demonstrates the recently demonstrated L1 vertebral compression deformity with interval kyphoplasty material. Degenerative changes more inferiorly. IMPRESSION: Previously demonstrated L1 vertebral compression deformity with interval kyphoplasty material. Electronically Signed   By: Beckie Salts M.D.   On: 04/03/2019 15:42   Dg C-arm 1-60 Min  Result Date: 04/03/2019 CLINICAL DATA:  L1 kyphoplasty EXAM: LUMBAR SPINE - 2-3 VIEW; DG C-ARM 1-60 MIN COMPARISON:  11/28/2010. Abdomen pelvis CT dated 02/25/2019. Chest radiographs dated 04/02/2019 FINDINGS: A single PA C-arm view of the thoracolumbar junction demonstrates the recently demonstrated L1 vertebral compression deformity with interval kyphoplasty material. Degenerative changes more inferiorly. IMPRESSION: Previously demonstrated L1 vertebral compression deformity with interval kyphoplasty material. Electronically Signed   By: Beckie Salts M.D.    On: 04/03/2019 15:42    Discharge Instructions    Incentive spirometry RT   Complete by: As directed       Follow-up Information    Venita Lick, MD. Schedule an appointment as soon as possible for a visit in 2 weeks.   Specialty: Orthopedic Surgery Why: For suture removal, If symptoms worsen, For wound re-check Contact information: [address] [place] [address] [phone]           Discharge Plan:  discharge to home  Disposition: stable    Signed: Leonette Monarch Charlesetta Milliron for [facility] PA-C [facility] [phone] 04/10/2019, 6:39 PM

## 2019-04-11 ENCOUNTER — Telehealth: Payer: Medicare Other | Admitting: Neurology

## 2019-04-11 ENCOUNTER — Other Ambulatory Visit: Payer: Self-pay

## 2019-04-11 ENCOUNTER — Ambulatory Visit (INDEPENDENT_AMBULATORY_CARE_PROVIDER_SITE_OTHER): Payer: Medicare Other | Admitting: Physician Assistant

## 2019-04-11 ENCOUNTER — Encounter: Payer: Self-pay | Admitting: Physician Assistant

## 2019-04-11 VITALS — BP 102/62 | HR 93 | Temp 97.8°F | Ht 59.0 in | Wt 101.0 lb

## 2019-04-11 DIAGNOSIS — R140 Abdominal distension (gaseous): Secondary | ICD-10-CM

## 2019-04-11 DIAGNOSIS — I6521 Occlusion and stenosis of right carotid artery: Secondary | ICD-10-CM | POA: Diagnosis not present

## 2019-04-11 DIAGNOSIS — K5900 Constipation, unspecified: Secondary | ICD-10-CM

## 2019-04-11 DIAGNOSIS — R12 Heartburn: Secondary | ICD-10-CM | POA: Diagnosis not present

## 2019-04-11 MED ORDER — LINACLOTIDE 72 MCG PO CAPS: 72.0000 ug | ORAL_CAPSULE | Freq: Every day | ORAL | 1 refills | Status: DC

## 2019-04-11 MED ORDER — OMEPRAZOLE 20 MG PO CPDR: 20.0000 mg | DELAYED_RELEASE_CAPSULE | Freq: Every day | ORAL | 3 refills | Status: DC

## 2019-04-11 NOTE — Progress Notes (Signed)
Chief Complaint: Abdominal pain, nausea and bloating  HPI:    Tracy Malone is an 85 year old female with a past medical history as listed below, known to Dr. Ardis Hughs, who was referred to me by [name], MD for a complaint of abdominal pain, nausea and bloating.      08/25/2016 EGD Dr. Ardis Hughs with mild nonspecific gastritis.  She was positive for H. pylori "again".  She was referred to ID for recurrent H. pylori gastritis and help with antibiotic choice given her many allergies.    04/08/2019 ER visit for abdominal pain.  Patient had a CT which showed no findings to account for abdominal pain.  Moderate compression fracture deformity of L1 with prior vertebral augmentation.  CBC CMP and lipase were normal.    Today, the patient presents clinic accompanied by her daughter who does assist with history.  She explains that since she has been seen in clinic last she has continued with bloating abdominal discomfort along with a burning which happens about 10 to 15 minutes after she eats almost anything.  Due to this they have placed her on a mostly bland diet, but she would like to try and eat other foods.  This pain can often be worse when she lays down to sleep and in fact has started to hinder her sleep schedule.    Was also seen in the ER as above with abdominal pain thought related to constipation.  Patient tells me that she may have 1-2 small bowel movements throughout the day but never feels completely empty.  Currently she has increased her MiraLAX to twice daily dosing but still feels incomplete evacuation.  She also feels as though the MiraLAX is contributing to her bloating.    Daughter asks if patient's Parkinson disease could be causing some of these issues, "slowness".    Denies fever, chills, weight loss or vomiting.  Past Medical History:  Diagnosis Date  . Bell's palsy 1966   Hx: right side facial droop, resolved per patient 04/02/19  . Carotid artery disease ([facility]) 2010   on vascular  screening;unchanged 2013.(could not tolerate simvastatin, no other statins tried)--<30% blockage bilat 07/2011  . Chronic abdominal pain   . Chronic fatigue and malaise   . Claustrophobia   . Depression    treated in the past for years;stopped in 2010 for a years  . Duodenal ulcer 1962   h/o  . Dysrhythmia    ocassional PVC's, no current problems per patient on 04/02/19  . Fibromyalgia   . Frequent PVCs 07/2012   Seen by [facility]: benign, asymptomatic, normal EF  . GERD (gastroesophageal reflux disease)    diet controlled  . Glaucoma, narrow-angle    s/p laser surgery  . History of hiatal hernia    during endoscopy  . Hypothyroid 9/08  . IBS (irritable bowel syndrome)    Dr. Benson Norway  . Ischemic colitis ([facility]) 11/21/2018   no current problems per patient on 04/02/19  . Ocular migraine   . Osteoporosis 10/11   Dr.Hawkes  . Panic attack   . Parkinson disease ([facility])   . Recurrent UTI    has cystocele-Dr.Grewal  . Shingles 1999   h/o  . Superficial thrombophlebitis 03/2009   RLE  . Trochanteric bursitis 12/2008   bilateral    Past Surgical History:  Procedure Laterality Date  . ABDOMINAL HYSTERECTOMY    . CATARACT EXTRACTION, BILATERAL  1995, 1996  . EYE SURGERY Bilateral    laser - glaucoma  .  Flexible sigmoidoscopy    . KYPHOPLASTY N/A 04/03/2019   Procedure: KYPHOPLASTY L1;  Surgeon: Melina Schools, MD;  Location: [facility];  Service: Orthopedics;  Laterality: N/A;  90 mins  . THYROIDECTOMY, PARTIAL  09/2005   L nodule; Dr. Harlow Asa  . TONSILLECTOMY  1946  . UPPER GI ENDOSCOPY  06/27/12  . VAGINAL HYSTERECTOMY  1971   and bladder repair.  Still has ovaries  . WISDOM TOOTH EXTRACTION      Current Outpatient Medications  Medication Sig Dispense Refill  . acetaminophen (TYLENOL) 500 MG tablet Take 500-1,000 mg by mouth 3 (three) times daily as needed (pain.).     Marland Kitchen ALPRAZolam (XANAX) 0.5 MG tablet TAKE BY MOUTH UP TO 3 TIMES DAILY. WARNING: BENZODIAZEPINES INCREASE THE RISK OF  FALLS AND NEUROCOGNITIVE DECLINE IN THE ELDERLY (Patient taking differently: Take 0.5-1 mg by mouth See admin instructions. Take 1 tablet (0.5 mg) by mouth twice daily & take 2 tablets(1 mg) by mouth in the evening.) 90 tablet 2  . calcium carbonate (OSCAL) 1500 (600 Ca) MG TABS tablet Take 600 mg by mouth 2 (two) times daily.    . Carbidopa-Levodopa ER (SINEMET CR) 25-100 MG tablet controlled release Take 1 tablet by mouth 4 (four) times daily. (Patient taking differently: Take 1 tablet by mouth 3 (three) times daily before meals. ) 360 tablet 1  . Cholecalciferol (VITAMIN D) 2000 UNITS tablet Take 2,000 Units by mouth daily.    Marland Kitchen docusate sodium (COLACE) 100 MG capsule Take 100 mg by mouth 2 (two) times daily.    . polyethylene glycol (MIRALAX / GLYCOLAX) 17 g packet Take 17 g by mouth daily.    . Probiotic Product (ALIGN PO) Take 1 capsule by mouth daily.     Marland Kitchen SYNTHROID 25 MCG tablet TAKE 1 TABLET DAILY BEFORE BREAKFAST (Patient taking differently: Take 25 mcg by mouth daily before breakfast. ) 90 tablet 1   No current facility-administered medications for this visit.     Allergies as of 04/11/2019 - Review Complete 04/11/2019  Allergen Reaction Noted  . Iodine Anaphylaxis 05/13/2012  . Levsin [hyoscyamine sulfate]  10/13/2015  . Salmon [fish allergy] Hives and Shortness Of Breath 05/13/2012  . Shellfish allergy Anaphylaxis 05/13/2012  . Tramadol Swelling 04/11/2019  . Remeron [mirtazapine] Other (See Comments) 08/25/2016  . Aspirin Other (See Comments) 05/13/2012  . Ciprofloxacin Diarrhea 05/13/2012  . Codeine Nausea And Vomiting 05/13/2012  . Darvocet [propoxyphene n-acetaminophen] Nausea And Vomiting 05/13/2012  . Demerol  [meperidine hcl]  02/20/2019  . Demerol [meperidine] Nausea Only 05/13/2012  . Dexilant [dexlansoprazole] Swelling 03/01/2016  . Diphedryl [diphenhydramine] Other (See Comments) 05/13/2012  . Doxycycline hyclate Other (See Comments) 05/13/2012  . Epinephrine  Other (See Comments) 05/13/2012  . Erythromycin Other (See Comments) 05/13/2012  . Flexeril [cyclobenzaprine] Other (See Comments) 05/13/2012  . Keflex [cephalexin] Hives 05/13/2012  . Latex Other (See Comments) 05/13/2012  . Nitrofurantoin Diarrhea 11/22/2018  . Other  02/20/2019  . Prednisone Other (See Comments) 05/13/2012  . Pylera [bis subcit-metronid-tetracyc] Swelling 08/01/2012  . Sulfa antibiotics Other (See Comments) 05/13/2012  . Xylocaine [lidocaine hcl]  05/13/2012  . Zoloft [sertraline hcl] Swelling and Other (See Comments) 05/13/2012  . Advil [ibuprofen] Other (See Comments) 05/13/2012  . Clarithromycin Rash 01/14/2015    Family History  Problem Relation Age of Onset  . Heart disease Mother   . Hypertension Mother   . Hypertension Sister   . HIV Son   . Heart disease Brother   . Lung  cancer Brother        lung  . Diabetes Maternal Grandfather   . Diabetes Granddaughter        type 1    Social History   Socioeconomic History  . Marital status: Married    Spouse name: Gwyndolyn Saxon  . Number of children: 2  . Years of education: Not on file  . Highest education level: Not on file  Occupational History  . Occupation: retired (school system)  Social Needs  . Financial resource strain: Not on file  . Food insecurity    Worry: Not on file    Inability: Not on file  . Transportation needs    Medical: Not on file    Non-medical: Not on file  Tobacco Use  . Smoking status: Never Smoker  . Smokeless tobacco: Never Used  Substance and Sexual Activity  . Alcohol use: No    Alcohol/week: 0.0 standard drinks  . Drug use: No  . Sexual activity: Not Currently    Partners: Male    Birth control/protection: Other-see comments    Comment: Hysterectomy  Lifestyle  . Physical activity    Days per week: Not on file    Minutes per session: Not on file  . Stress: Not on file  Relationships  . Social Herbalist on phone: Not on file    Gets together: Not  on file    Attends religious service: Not on file    Active member of club or organization: Not on file    Attends meetings of clubs or organizations: Not on file    Relationship status: Not on file  . Intimate partner violence    Fear of current or ex partner: Not on file    Emotionally abused: Not on file    Physically abused: Not on file    Forced sexual activity: Not on file  Other Topics Concern  . Not on file  Social History Narrative   Married.  Son lives in [place]; Daughter [name] lives in [place]; 2 grandchildren    Review of Systems:    Constitutional: No weight loss, fever or chills Cardiovascular: No chest pain Respiratory: No SOB  Gastrointestinal: See HPI and otherwise negative   Physical Exam:  Vital signs: BP 102/62 (BP Location: Left Arm, Patient Position: Sitting, Cuff Size: Normal)   Pulse 93   Temp 97.8 F (36.6 C) (Oral)   Ht 4\' 11"  (1.499 m)   Wt 101 lb (45.8 kg) Comment: per patient  LMP  (LMP Unknown)   BMI 20.40 kg/m   Constitutional:   Pleasant Elderly Frail appearing Caucasian female appears to be in NAD, Well developed, Well nourished, alert and cooperative Respiratory: Respirations even and unlabored. Lungs clear to auscultation bilaterally.   No wheezes, crackles, or rhonchi.  Cardiovascular: Normal S1, S2. No MRG. Regular rate and rhythm. No peripheral edema, cyanosis or pallor.  Gastrointestinal:  Soft, nondistended, nontender. No rebound or guarding. Normal bowel sounds. No appreciable masses or hepatomegaly. Rectal:  Not performed.  Msk:  Symmetrical without gross deformities. Without edema, no deformity or joint abnormality. Ambulates in wheelchair Psychiatric: Demonstrates good judgement and reason without abnormal affect or behaviors.  MOST RECENT LABS AND IMAGING: CBC    Component Value Date/Time   WBC 8.2 04/08/2019 0516   RBC 5.01 04/08/2019 0516   HGB 14.8 04/08/2019 0516   HGB 14.5 12/02/2018 1454   HCT 46.0 04/08/2019 0516    HCT 42.6 12/02/2018 1454   PLT  341 04/08/2019 0516   PLT 411 12/02/2018 1454   MCV 91.8 04/08/2019 0516   MCV 89 12/02/2018 1454   MCH 29.5 04/08/2019 0516   MCHC 32.2 04/08/2019 0516   RDW 14.2 04/08/2019 0516   RDW 12.1 12/02/2018 1454   LYMPHSABS 2.4 04/08/2019 0516   LYMPHSABS 2.5 12/02/2018 1454   MONOABS 0.9 04/08/2019 0516   EOSABS 0.3 04/08/2019 0516   EOSABS 0.2 12/02/2018 1454   BASOSABS 0.1 04/08/2019 0516   BASOSABS 0.1 12/02/2018 1454    CMP     Component Value Date/Time   NA 142 04/08/2019 0516   NA 139 12/02/2018 1454   K 3.7 04/08/2019 0516   CL 106 04/08/2019 0516   CO2 25 04/08/2019 0516   GLUCOSE 98 04/08/2019 0516   BUN 10 04/08/2019 0516   BUN 11 12/02/2018 1454   CREATININE 0.59 04/08/2019 0516   CREATININE 0.75 10/22/2018 1401   CREATININE 0.67 11/23/2016 0815   CALCIUM 9.5 04/08/2019 0516   PROT 7.2 04/08/2019 0516   PROT 6.7 03/06/2019 1503   ALBUMIN 4.0 04/08/2019 0516   ALBUMIN 4.2 03/06/2019 1503   AST 29 04/08/2019 0516   AST 17 10/22/2018 1401   ALT 22 04/08/2019 0516   ALT 7 10/22/2018 1401   ALKPHOS 111 04/08/2019 0516   BILITOT 0.7 04/08/2019 0516   BILITOT 0.7 03/06/2019 1503   BILITOT 0.8 10/22/2018 1401   GFRNONAA >60 04/08/2019 0516   GFRNONAA >60 10/22/2018 1401   GFRAA >60 04/08/2019 0516   GFRAA >60 10/22/2018 1401    Assessment: 1.  Epigastric pain: History of recurrent H. pylori in the past, will try to treat for reflux and see how this goes, may benefit from repeat testing 2.  Constipation: Likely related to age+/-Parkinson's+/-recent back surgery and decrease in ambulation 3.  Bloating: With above  Plan: 1.  Prescribed Omeprazole 20 mg daily, 30 minutes before dinner as patient tells me it is hard for her to sleep due to heartburn and she often wakes up from this in the morning. 2.  Patient can continue Mylanta if needed. 3.  Prescribed Linzess 72 mcg samples.  Recommend taking on an empty stomach in the morning.   Prescribed #30 with 1 refill.  Did encourage patient to continue this for at least a month unless she is having severe diarrhea. 4.  Provided her with a low gas producing diet handout. 5.  Did discuss various reasons for patient's current symptoms, she is now in a wheelchair and not moving around as much, also has Parkinson's and has not been eating a regular diet. 6.  Patient to follow in clinic with me in 6-8 weeks or sooner if necessary.  Ellouise Newer, PA-C [facility] 04/11/2019, 3:06 PM  Cc: [name], MD.

## 2019-04-11 NOTE — Patient Instructions (Signed)
If you are age 83 or older, your body mass index should be between 23-30. Your Body mass index is 20.4 kg/m. If this is out of the aforementioned range listed, please consider follow up with your Primary Care Provider.  If you are age 64 or younger, your body mass index should be between 19-25. Your Body mass index is 20.4 kg/m. If this is out of the aformentioned range listed, please consider follow up with your Primary Care Provider.   We have sent the following medications to your pharmacy for you to pick up at your convenience: Omeprazole, Linzess   Please follow gas and flatulence diet that was given.   Start Linzess 19mcg - once daily. Samples given.   Thank you for choosing me and [facility].  Dennison Bulla

## 2019-04-13 NOTE — Progress Notes (Signed)
I agree with the above note, plan 

## 2019-04-16 ENCOUNTER — Other Ambulatory Visit: Payer: Self-pay

## 2019-04-16 ENCOUNTER — Ambulatory Visit: Payer: Self-pay | Admitting: Internal Medicine

## 2019-04-17 ENCOUNTER — Other Ambulatory Visit: Payer: Self-pay

## 2019-04-17 ENCOUNTER — Other Ambulatory Visit: Payer: Medicare Other | Admitting: *Deleted

## 2019-04-17 ENCOUNTER — Other Ambulatory Visit: Payer: Medicare Other | Admitting: Licensed Clinical Social Worker

## 2019-04-17 DIAGNOSIS — Z515 Encounter for palliative care: Secondary | ICD-10-CM

## 2019-04-17 NOTE — Progress Notes (Signed)
COMMUNITY PALLIATIVE CARE RN NOTE  PATIENT NAME: Tracy Malone DOB: 22-Jun-1932 MRN: [id number]  PRIMARY CARE PROVIDER: [name], MD  RESPONSIBLE PARTY: [name] (husband) Acct ID - Guarantor Home Phone Work Phone Relationship Acct Type  [id number] BENDETTA, [phone] Self P/F     [address], [place], [place] [address]   Due to the COVID-19 crisis, this virtual check-in visit was done via telephone from my office and it was initiated and consent by this patient and or family.  PLAN OF CARE and INTERVENTION 1. ADVANCE CARE PLANNING/GOALS OF CARE: Goal is for patient to receive in-home PT to get stronger s/p kyphoplasty. She is a Full code. 2. PATIENT/CAREGIVER EDUCATION: Safe Mobility/Transfers, Pain Management 3. DISEASE STATUS: Joint virtual check-in visit completed via telephone with [place]. Patient reports a new onset of pain noted in her lower back. She had a recent kyphoplasty, but reports the area of surgery was in her upper back. She is interested in receiving in home PT for strengthening. She has an appointment with one of her physicians tomorrow and will request a PT referral during this appointment. She is taking Tylenol EX up to 3x/day which is effective. She is also applying ice to her lower back as she says this helps with the swelling she is also experiencing. She does not feel comfortable walking with a cane or a walker. She says she does better walking when she places her hands on both hips. She remains able to bathe and dress herself. Her husband does assist her to the bathroom as needed. Her intake has decreased. She says that she has lost about 6 lbs with a current weight of 101 lbs. She is making herself nutritional shakes to help. She reports that her bowels have been more regular over the past few days. She is drinking warm prune juice and taking 1/2 dose of Miralax daily. She says she was placed on Linzess and tried one  dose, but this caused side effects of abdominal pain and discomfort so she is not going to take any more. Will continue to monitor.  HISTORY OF PRESENT ILLNESS:  This is a 85 yo female who resides at home with her husband. Palliative Care team continues to follow patient. Will continue to visit monthly and PRN.  CODE STATUS: Full code ADVANCED DIRECTIVES: Y MOST FORM: no PPS: 50%   (Duration of visit and documentation 45 minutes)   [name], RN BSN

## 2019-04-17 NOTE — Progress Notes (Signed)
COMMUNITY PALLIATIVE CARE SW NOTE  PATIENT NAME: Tracy Malone DOB: 20-Nov-1931 MRN: [id number]  PRIMARY CARE PROVIDER: [name], MD  RESPONSIBLE PARTY:  Acct ID - Guarantor Home Phone Work Phone Relationship Acct Type  [id number] [name] Self P/F     [address] [place], [place] [address]   Due to the COVID-19 crisis, this virtual check-in visit was done via telephone from my office and it was initiated and consent given by this patient and or family.   PLAN OF CARE and INTERVENTIONS:             1. GOALS OF CARE/ ADVANCE CARE PLANNING:  Patient's goal is to remain in her home with her husband, Tracy Malone, who is also a Palliative Care patient.  She is a full code. 2. SOCIAL/EMOTIONAL/SPIRITUAL ASSESSMENT/ INTERVENTIONS:  SW and Palliative Care RN, [name], conducted a virtual check-in visit with patient in her home.  She had back surgery about two weeks ago.  She said she is having lower back pain now, but is relieved by Tylenol.  She is using a walker periodically to ambulate, but she is leaning over, causing discomfort.  She is going to request physical therapy in her home during an MD visit tomorrow.  Her husband has been during chores and light cooking since her movements are limited.  She reports tiring quickly.  SW provided active listening and supportive counseling. 3. PATIENT/CAREGIVER EDUCATION/ COPING:  Patient copes by expressing her feelings openly. 4. PERSONAL EMERGENCY PLAN:  Patient will inform her MD. 5. COMMUNITY RESOURCES COORDINATION/ HEALTH CARE NAVIGATION:  She has assistance with housekeeping. 6. FINANCIAL/LEGAL CONCERNS/INTERVENTIONS:  None.     SOCIAL HX:  Social History   Tobacco Use  . Smoking status: Never Smoker  . Smokeless tobacco: Never Used  Substance Use Topics  . Alcohol use: No    Alcohol/week: 0.0 standard drinks    CODE STATUS:  Full code ADVANCED DIRECTIVES: No MOST FORM COMPLETE:  No HOSPICE  EDUCATION PROVIDED:  No PPS: Her appetite has decreased due to pain issues.  She can ambulate independently. Duration of visit and documentation:  45 minutes.      Creola Corn Ardelia Wrede, LCSW

## 2019-04-21 ENCOUNTER — Telehealth: Payer: Self-pay | Admitting: Neurology

## 2019-04-21 ENCOUNTER — Encounter: Payer: Self-pay | Admitting: Family Medicine

## 2019-04-21 NOTE — Telephone Encounter (Signed)
Please cancel appt with me for wed.  Pt has transferred care (again) back to [name] at [facility].  Let pt know that we have cancelled appt as she saw [name] recently and has a f/u with him.  Thank you!

## 2019-04-22 ENCOUNTER — Telehealth: Payer: Self-pay

## 2019-04-22 ENCOUNTER — Other Ambulatory Visit: Payer: Self-pay

## 2019-04-22 MED ORDER — OMEPRAZOLE 20 MG PO CPDR: 20.0000 mg | DELAYED_RELEASE_CAPSULE | Freq: Two times a day (BID) | ORAL | 3 refills | Status: DC

## 2019-04-22 NOTE — Progress Notes (Signed)
omeprazol

## 2019-04-22 NOTE — Telephone Encounter (Signed)
Called patient and she states she has been taking the Omeprazole 30 mins. Before supper. Let her know Ellouise Newer PA wants her to start taking it twice a day, 30 mins. before breakfast and 30 mins. before supper. Said she never got the Omeprazole script sent in a few days ago. I resent it. She says she only took the [place] once and does not remember if it was with food or not. Michela Pitcher it gave her sever diarrhea so she stopped. Asked her to take Miralax BID for a few weeks and see if that gives her regular bowel empting.

## 2019-04-23 ENCOUNTER — Telehealth: Payer: Medicare Other | Admitting: Neurology

## 2019-04-24 ENCOUNTER — Encounter: Payer: Self-pay | Admitting: Neurology

## 2019-04-28 ENCOUNTER — Ambulatory Visit (INDEPENDENT_AMBULATORY_CARE_PROVIDER_SITE_OTHER): Payer: Medicare Other | Admitting: Physician Assistant

## 2019-04-28 ENCOUNTER — Encounter: Payer: Self-pay | Admitting: Physician Assistant

## 2019-04-28 ENCOUNTER — Other Ambulatory Visit: Payer: Self-pay

## 2019-04-28 ENCOUNTER — Encounter: Payer: Self-pay | Admitting: Family Medicine

## 2019-04-28 ENCOUNTER — Telehealth: Payer: Self-pay | Admitting: Neurology

## 2019-04-28 VITALS — BP 118/58 | HR 72 | Temp 97.1°F | Ht 59.0 in

## 2019-04-28 DIAGNOSIS — R12 Heartburn: Secondary | ICD-10-CM

## 2019-04-28 DIAGNOSIS — I6521 Occlusion and stenosis of right carotid artery: Secondary | ICD-10-CM | POA: Diagnosis not present

## 2019-04-28 DIAGNOSIS — K5900 Constipation, unspecified: Secondary | ICD-10-CM | POA: Diagnosis not present

## 2019-04-28 NOTE — Patient Instructions (Signed)
If you are age 83 or older, your body mass index should be between 23-30. Your Body mass index is 20.4 kg/m. If this is out of the aforementioned range listed, please consider follow up with your Primary Care Provider.  If you are age 35 or younger, your body mass index should be between 19-25. Your Body mass index is 20.4 kg/m. If this is out of the aformentioned range listed, please consider follow up with your Primary Care Provider.   Continue Omeprazole 20 mg twice daily.  Continue Miralax every other day.  Follow up with me in three months.  Please call our office in a couple of weeks to make an appointment as the schedule is not available at this time.  Thank you for choosing me and [facility].    Ellouise Newer, PA-C

## 2019-04-28 NOTE — Telephone Encounter (Signed)
Patient dismissed from [facility] by [place] DO, effective 04/24/19. Dismissal letter was sent by registered mail. js

## 2019-04-28 NOTE — Progress Notes (Signed)
Chief Complaint: Follow-up abdominal pain, nausea and bloating  HPI:    Tracy Malone is an 85 year old female with a past medical history as listed below, known to Dr. Ardis Hughs, who returns clinic today for follow-up of her abdominal pain, nausea and bloating.    04/11/2019 patient seen in the clinic after being seen in the ER on the 15th for abdominal pain.  CT showed no findings to account for abdominal pain.  Labs were normal.  At that time patient presented with daughter who described that she had continued with abdominal bloating and discomfort along with burning which happens 10 to 15 minutes after she ate anything.  She had been on mostly bland diet to try and help.  This that hindered her sleep.  Also describes constipation.  It was noted that her Parkinson's is likely contributing.  At that time prescribed omeprazole 20 mg daily and Linzess 72 mcg daily.    04/22/2019 patient continued to complain of burning.  Her omeprazole was increased to twice daily dosing and she was told to use MiraLAX twice a day after the Linzess gave her diarrhea.    Today, the patient presents to clinic accompanied by her husband and tells me that she has had no further episodes of burning across her abdomen since she has been using the Omeprazole 20 mg twice daily except for last night at about 3:30 in the morning.  When she does have breakthrough symptoms she just uses some Gaviscon which helps relieve her symptoms.  Tells me in general she still feels weak after her back surgery and finds herself laying down for much of the day because she just "cannot sit up".  Does still have a decreased appetite.    Constipation is better with MiraLAX every other day per the patient.    Denies fever, chills, nausea or vomiting.  Past Medical History:  Diagnosis Date  . Bell's palsy 1966   Hx: right side facial droop, resolved per patient 04/02/19  . Carotid artery disease ([place]) 2010   on vascular screening;unchanged 2013.(could  not tolerate simvastatin, no other statins tried)--<30% blockage bilat 07/2011  . Chronic abdominal pain   . Chronic fatigue and malaise   . Claustrophobia   . Depression    treated in the past for years;stopped in 2010 for a years  . Duodenal ulcer 1962   h/o  . Dysrhythmia    ocassional PVC's, no current problems per patient on 04/02/19  . Fibromyalgia   . Frequent PVCs 07/2012   Seen by [facility]: benign, asymptomatic, normal EF  . GERD (gastroesophageal reflux disease)    diet controlled  . Glaucoma, narrow-angle    s/p laser surgery  . History of hiatal hernia    during endoscopy  . Hypothyroid 9/08  . IBS (irritable bowel syndrome)    Dr. Benson Norway  . Ischemic colitis ([place]) 11/21/2018   no current problems per patient on 04/02/19  . Ocular migraine   . Osteoporosis 10/11   Dr.Hawkes  . Panic attack   . Parkinson disease ([place])   . Recurrent UTI    has cystocele-Dr.Grewal  . Shingles 1999   h/o  . Superficial thrombophlebitis 03/2009   RLE  . Trochanteric bursitis 12/2008   bilateral    Past Surgical History:  Procedure Laterality Date  . ABDOMINAL HYSTERECTOMY    . CATARACT EXTRACTION, BILATERAL  1995, 1996  . EYE SURGERY Bilateral    laser - glaucoma  . Flexible sigmoidoscopy    .  KYPHOPLASTY N/A 04/03/2019   Procedure: KYPHOPLASTY L1;  Surgeon: Melina Schools, MD;  Location: [facility];  Service: Orthopedics;  Laterality: N/A;  90 mins  . THYROIDECTOMY, PARTIAL  09/2005   L nodule; Dr. Harlow Asa  . TONSILLECTOMY  1946  . UPPER GI ENDOSCOPY  06/27/12  . VAGINAL HYSTERECTOMY  1971   and bladder repair.  Still has ovaries  . WISDOM TOOTH EXTRACTION      Current Outpatient Medications  Medication Sig Dispense Refill  . acetaminophen (TYLENOL) 500 MG tablet Take 500-1,000 mg by mouth 3 (three) times daily as needed (pain.).     Marland Kitchen ALPRAZolam (XANAX) 0.5 MG tablet TAKE BY MOUTH UP TO 3 TIMES DAILY. WARNING: BENZODIAZEPINES INCREASE THE RISK OF FALLS AND NEUROCOGNITIVE  DECLINE IN THE ELDERLY (Patient taking differently: Take 0.5-1 mg by mouth See admin instructions. Take 1 tablet (0.5 mg) by mouth twice daily & take 2 tablets(1 mg) by mouth in the evening.) 90 tablet 2  . calcium carbonate (OSCAL) 1500 (600 Ca) MG TABS tablet Take 600 mg by mouth 2 (two) times daily.    . Carbidopa-Levodopa ER (SINEMET CR) 25-100 MG tablet controlled release Take 1 tablet by mouth 4 (four) times daily. (Patient taking differently: Take 1 tablet by mouth 3 (three) times daily before meals. ) 360 tablet 1  . Cholecalciferol (VITAMIN D) 2000 UNITS tablet Take 2,000 Units by mouth daily.    Marland Kitchen docusate sodium (COLACE) 100 MG capsule Take 100 mg by mouth 2 (two) times daily.    Marland Kitchen omeprazole (PRILOSEC) 20 MG capsule Take 1 capsule (20 mg total) by mouth 2 (two) times daily before a meal. 60 capsule 3  . polyethylene glycol (MIRALAX / GLYCOLAX) 17 g packet Take 17 g by mouth daily.    . Probiotic Product (ALIGN PO) Take 1 capsule by mouth daily.     Marland Kitchen SYNTHROID 25 MCG tablet TAKE 1 TABLET DAILY BEFORE BREAKFAST (Patient taking differently: Take 25 mcg by mouth daily before breakfast. ) 90 tablet 1  . linaclotide (LINZESS) 72 MCG capsule Take 1 capsule (72 mcg total) by mouth daily before breakfast. (Patient not taking: Reported on 04/28/2019) 30 capsule 1   No current facility-administered medications for this visit.     Allergies as of 04/28/2019 - Review Complete 04/28/2019  Allergen Reaction Noted  . Iodine Anaphylaxis 05/13/2012  . Levsin [hyoscyamine sulfate]  10/13/2015  . Salmon [fish allergy] Hives and Shortness Of Breath 05/13/2012  . Shellfish allergy Anaphylaxis 05/13/2012  . Tramadol Swelling 04/11/2019  . Remeron [mirtazapine] Other (See Comments) 08/25/2016  . Aspirin Other (See Comments) 05/13/2012  . Ciprofloxacin Diarrhea 05/13/2012  . Codeine Nausea And Vomiting 05/13/2012  . Darvocet [propoxyphene n-acetaminophen] Nausea And Vomiting 05/13/2012  . Demerol   [meperidine hcl]  02/20/2019  . Demerol [meperidine] Nausea Only 05/13/2012  . Dexilant [dexlansoprazole] Swelling 03/01/2016  . Diphedryl [diphenhydramine] Other (See Comments) 05/13/2012  . Doxycycline hyclate Other (See Comments) 05/13/2012  . Epinephrine Other (See Comments) 05/13/2012  . Erythromycin Other (See Comments) 05/13/2012  . Flexeril [cyclobenzaprine] Other (See Comments) 05/13/2012  . Keflex [cephalexin] Hives 05/13/2012  . Latex Other (See Comments) 05/13/2012  . Nitrofurantoin Diarrhea 11/22/2018  . Other  02/20/2019  . Prednisone Other (See Comments) 05/13/2012  . Pylera [bis subcit-metronid-tetracyc] Swelling 08/01/2012  . Sulfa antibiotics Other (See Comments) 05/13/2012  . Xylocaine [lidocaine hcl]  05/13/2012  . Zoloft [sertraline hcl] Swelling and Other (See Comments) 05/13/2012  . Advil [ibuprofen] Other (See Comments) 05/13/2012  .  Clarithromycin Rash 01/14/2015    Family History  Problem Relation Age of Onset  . Heart disease Mother   . Hypertension Mother   . Hypertension Sister   . HIV Son   . Heart disease Brother   . Lung cancer Brother        lung  . Diabetes Maternal Grandfather   . Diabetes Granddaughter        type 1    Social History   Socioeconomic History  . Marital status: Married    Spouse name: Gwyndolyn Saxon  . Number of children: 2  . Years of education: Not on file  . Highest education level: Not on file  Occupational History  . Occupation: retired (school system)  Social Needs  . Financial resource strain: Not on file  . Food insecurity    Worry: Not on file    Inability: Not on file  . Transportation needs    Medical: Not on file    Non-medical: Not on file  Tobacco Use  . Smoking status: Never Smoker  . Smokeless tobacco: Never Used  Substance and Sexual Activity  . Alcohol use: No    Alcohol/week: 0.0 standard drinks  . Drug use: No  . Sexual activity: Not Currently    Partners: Male    Birth control/protection:  Other-see comments    Comment: Hysterectomy  Lifestyle  . Physical activity    Days per week: Not on file    Minutes per session: Not on file  . Stress: Not on file  Relationships  . Social Herbalist on phone: Not on file    Gets together: Not on file    Attends religious service: Not on file    Active member of club or organization: Not on file    Attends meetings of clubs or organizations: Not on file    Relationship status: Not on file  . Intimate partner violence    Fear of current or ex partner: Not on file    Emotionally abused: Not on file    Physically abused: Not on file    Forced sexual activity: Not on file  Other Topics Concern  . Not on file  Social History Narrative   Married.  Son lives in [place]; Daughter [name] lives in [place]; 2 grandchildren    Review of Systems:    Constitutional: No weight loss, fever or chills Cardiovascular: No chest pain Respiratory: No SOB  Gastrointestinal: See HPI and otherwise negative   Physical Exam:  Vital signs: BP (!) 118/58   Pulse 72   Temp (!) 97.1 F (36.2 C)   LMP  (LMP Unknown)   Constitutional:   Pleasant Elderly Caucasian female appears to be in NAD, Well developed, Well nourished, alert and cooperative Respiratory: Respirations even and unlabored. Lungs clear to auscultation bilaterally.   No wheezes, crackles, or rhonchi.  Cardiovascular: Normal S1, S2. No MRG. Regular rate and rhythm. No peripheral edema, cyanosis or pallor.  Gastrointestinal:  Soft, nondistended, nontender. No rebound or guarding. Normal bowel sounds. No appreciable masses or hepatomegaly. Rectal:  Not performed.  Psychiatric: Demonstrates good judgement and reason without abnormal affect or behaviors.  No recent labs.  Assessment: 1.  Constipation: Better with MiraLAX every other day; again thought related to Parkinson's + being sedentary+ lack of food 2.  Heartburn: Better on Omeprazole 20 twice daily  Plan: 1.  Continue  Omeprazole 20 mg twice daily.  Patient does have a history of osteoporosis, will discuss decreasing  this at her next visit to once daily dosing, pending amount of breakthroughs that she is having. 2.  Continue Gaviscon for breakthrough heartburn. 3.  Continue MiraLAX every other day to maintain regular stools. 4.  Patient to follow in clinic with me in 3 to 4 months.  Ellouise Newer, PA-C [facility] 04/28/2019, 3:38 PM  Cc: [name], MD

## 2019-04-29 NOTE — Progress Notes (Signed)
I agree with the above note, plan 

## 2019-05-01 ENCOUNTER — Ambulatory Visit (INDEPENDENT_AMBULATORY_CARE_PROVIDER_SITE_OTHER): Payer: Medicare Other | Admitting: Psychiatry

## 2019-05-01 DIAGNOSIS — M79604 Pain in right leg: Secondary | ICD-10-CM | POA: Diagnosis not present

## 2019-05-01 DIAGNOSIS — I890 Lymphedema, not elsewhere classified: Secondary | ICD-10-CM | POA: Diagnosis not present

## 2019-05-01 DIAGNOSIS — F411 Generalized anxiety disorder: Secondary | ICD-10-CM | POA: Diagnosis not present

## 2019-05-02 DIAGNOSIS — C44529 Squamous cell carcinoma of skin of other part of trunk: Secondary | ICD-10-CM | POA: Diagnosis not present

## 2019-05-02 DIAGNOSIS — D485 Neoplasm of uncertain behavior of skin: Secondary | ICD-10-CM | POA: Diagnosis not present

## 2019-05-02 DIAGNOSIS — B353 Tinea pedis: Secondary | ICD-10-CM | POA: Diagnosis not present

## 2019-05-02 DIAGNOSIS — L218 Other seborrheic dermatitis: Secondary | ICD-10-CM | POA: Diagnosis not present

## 2019-05-08 ENCOUNTER — Other Ambulatory Visit: Payer: Self-pay | Admitting: Family Medicine

## 2019-05-08 DIAGNOSIS — E039 Hypothyroidism, unspecified: Secondary | ICD-10-CM

## 2019-05-13 ENCOUNTER — Telehealth: Payer: Self-pay | Admitting: *Deleted

## 2019-05-13 ENCOUNTER — Telehealth: Payer: Self-pay | Admitting: Neurology

## 2019-05-13 NOTE — Telephone Encounter (Signed)
Patient called and requested a call back from a nurse. She said the she has questions about the carbidopa-levodopa dosing instructions. She wants to know how much time should pass in between each dose.

## 2019-05-13 NOTE — Telephone Encounter (Signed)
She has transferred her care to [facility] and should direct her questions to that office.

## 2019-05-13 NOTE — Telephone Encounter (Addendum)
Called patient and directed patient to [facility] office. She had question about her carbadopa/levadopa as she has just been doing 3x day with meals and just noticed bottle said 4x day. Informed patient 4/2 progress note stated to add bedtime dose.   Discussed above with MD and she stated to have patient contact her neurologist now following her at [facility]. Attempted to call patient back no answer.

## 2019-05-13 NOTE — Telephone Encounter (Signed)
Contacted and spoke with patient to arrange a home visit. Visit scheduled for 05/16/19 at 1:00 pm.

## 2019-05-14 DIAGNOSIS — C44529 Squamous cell carcinoma of skin of other part of trunk: Secondary | ICD-10-CM | POA: Diagnosis not present

## 2019-05-14 NOTE — Telephone Encounter (Signed)
I spoke with patient and notified her to contact [facility]

## 2019-05-15 ENCOUNTER — Telehealth: Payer: Self-pay | Admitting: *Deleted

## 2019-05-15 ENCOUNTER — Other Ambulatory Visit (INDEPENDENT_AMBULATORY_CARE_PROVIDER_SITE_OTHER): Payer: Medicare Other

## 2019-05-15 ENCOUNTER — Other Ambulatory Visit: Payer: Self-pay

## 2019-05-15 DIAGNOSIS — Z23 Encounter for immunization: Secondary | ICD-10-CM

## 2019-05-15 NOTE — Telephone Encounter (Signed)
Patient was here and asked if you would look at note from PA Lemmon-said that she mentioned that her BMI was too low-wonders if she should see a dietician? Please advise.

## 2019-05-15 NOTE — Telephone Encounter (Signed)
Will send message to pt

## 2019-05-16 ENCOUNTER — Other Ambulatory Visit: Payer: Medicare Other | Admitting: Licensed Clinical Social Worker

## 2019-05-16 ENCOUNTER — Other Ambulatory Visit: Payer: Medicare Other | Admitting: *Deleted

## 2019-05-16 DIAGNOSIS — Z515 Encounter for palliative care: Secondary | ICD-10-CM

## 2019-05-17 NOTE — Progress Notes (Signed)
COMMUNITY PALLIATIVE CARE SW NOTE  PATIENT NAME: Tracy Malone DOB: 22-Jun-1932 MRN: [id number]  PRIMARY CARE PROVIDER: [name], MD  RESPONSIBLE PARTY:  Acct ID - Guarantor Home Phone Work Phone Relationship Acct Type  [id number] [name] Self P/F     [address], [place], [place] [address]     PLAN OF CARE and INTERVENTIONS:             1. GOALS OF CARE/ ADVANCE CARE PLANNING:  Goal is for patient to remain in her home with her husband, [name], who is also a Palliative Care patient.  Patient is a full code. 2. SOCIAL/EMOTIONAL/SPIRITUAL ASSESSMENT/ INTERVENTIONS:  SW and Palliative Care RN, [name], met with patient and husband in their home.  She ambulated with caution due to her back surgery.  She reported the pain continues to be an issue if she extends herself more than she should.  She has changed doctors and felt she needed to explain the event.  She has difficulty expressing her need for assistance from her husband.  She was not tearful during this visit.  She was recently started on an antidepressant.  SW provided active listening and supportive counseling. 3. PATIENT/CAREGIVER EDUCATION/ COPING:  Patient copes by venting. 4. PERSONAL EMERGENCY PLAN:  Family or patient will contact her MD. 5. COMMUNITY RESOURCES COORDINATION/ HEALTH CARE NAVIGATION:  She has housekeeping assistance. 6. FINANCIAL/LEGAL CONCERNS/INTERVENTIONS:  None.     SOCIAL HX:  Social History   Tobacco Use  . Smoking status: Never Smoker  . Smokeless tobacco: Never Used  Substance Use Topics  . Alcohol use: No    Alcohol/week: 0.0 standard drinks    CODE STATUS:   Full Code ADVANCED DIRECTIVES: No MOST FORM COMPLETE:  No HOSPICE EDUCATION PROVIDED: No PPS:  Patient reports her appetite has improved.  She is able to ambulate independently. Duration of visit and documentation:  45 minutes.      Creola Corn Mikiah Demond, LCSW

## 2019-05-19 ENCOUNTER — Other Ambulatory Visit: Payer: Self-pay

## 2019-05-19 NOTE — Progress Notes (Signed)
COMMUNITY PALLIATIVE CARE RN NOTE  PATIENT NAME: Tracy Malone DOB: 11-20-1931 MRN: [id number]  PRIMARY CARE PROVIDER: [name], MD  RESPONSIBLE PARTY: Tracy Malone (husband) Acct ID - Guarantor Home Phone Work Phone Relationship Acct Type  [id number] [name], [name] Self P/F     [address] [place], [place] [address]   Covid-19 Pre-screening Negative  PLAN OF CARE and INTERVENTION:  1. ADVANCE CARE PLANNING/GOALS OF CARE: Goal is to feel better overall and avoid hospitalizations. She is a Full code. 2. PATIENT/CAREGIVER EDUCATION: Safe Mobility and Pain Management 3. DISEASE STATUS: Joint visit made with Palliative care SW, Tracy Malone. Met with patient and husband in their home. Patient reports pain in her lower back from her recent kyphoplasty. The pain moves around to different areas in her lower back and sides. She is taking Tylenol to make pain more tolerable. She has difficulty getting comfortable while in bed. Pain worsens if she has sat for too long or bends over to pick up things (she knows she is not supposed to bend.) She requires assistance in putting her pants on. She is able to bathe and dress herself independently otherwise. She ambulates at a slow pace. She has a cane, but says it seems more difficult to ambulate while using it. She reports 2 recent falls over the past 2 weeks, without injury. She has someone coming tomorrow to clean her house she is unable to perform many household chores at this time. Her mood is more pleasant today and no tearful episodes occurred. She was recently started on Prozac and feels that this medication has helped her. I notice that her tremor is slightly worse in her right hand/arm. She continues on Carbidopa-Levodopa 3x/day. Her intake is poor-fair. Fluid intake remains good. She appears thinner and clothes are fitting more loosely. She has stomach issues/upset/diarrhea when drinking Ensure for nutritional  supplementation. Recommended Boost Breeze to try since it is juice based. She says that 2 weeks ago her weight had gone down to 99 lbs. Her bowels have been more regular. Will continue to monitor.   HISTORY OF PRESENT ILLNESS:  This is a 85 yo female who resides at home with her husband, who is also a Palliative care patient. Palliative care team continues to follow patient. Will continue to visit monthly and PRN.  CODE STATUS: Full code  ADVANCED DIRECTIVES: Y MOST FORM: no PPS: 50%   PHYSICAL EXAM:   LUNGS: clear to auscultation  CARDIAC: Cor RRR EXTREMITIES: No edema SKIN: Exposed skin is dry and intact  NEURO: Alert and oriented x 3, right hand/arm tremor, generalized weakness, ambulatory   (Duration of visit and documentation 60 minutes)   Tracy Eastern, RN BSN

## 2019-05-20 ENCOUNTER — Ambulatory Visit (INDEPENDENT_AMBULATORY_CARE_PROVIDER_SITE_OTHER): Payer: Medicare Other | Admitting: Psychiatry

## 2019-05-20 DIAGNOSIS — Z4889 Encounter for other specified surgical aftercare: Secondary | ICD-10-CM | POA: Diagnosis not present

## 2019-05-20 DIAGNOSIS — F411 Generalized anxiety disorder: Secondary | ICD-10-CM | POA: Diagnosis not present

## 2019-05-22 ENCOUNTER — Other Ambulatory Visit: Payer: Self-pay | Admitting: Family Medicine

## 2019-05-22 DIAGNOSIS — Z1231 Encounter for screening mammogram for malignant neoplasm of breast: Secondary | ICD-10-CM

## 2019-05-25 HISTORY — PX: BACK SURGERY: SHX140

## 2019-05-27 DIAGNOSIS — D692 Other nonthrombocytopenic purpura: Secondary | ICD-10-CM | POA: Diagnosis not present

## 2019-05-27 DIAGNOSIS — Z85828 Personal history of other malignant neoplasm of skin: Secondary | ICD-10-CM | POA: Diagnosis not present

## 2019-05-27 DIAGNOSIS — L570 Actinic keratosis: Secondary | ICD-10-CM | POA: Diagnosis not present

## 2019-05-28 ENCOUNTER — Telehealth: Payer: Self-pay | Admitting: Family Medicine

## 2019-05-28 NOTE — Telephone Encounter (Signed)
Patient advised.

## 2019-05-28 NOTE — Telephone Encounter (Signed)
Pt called and states that she has been at her dermatology appts she had a place taken off her chest. The pathology showed Carcinoma. She went yesterday for a follow up and showed the doctor some red blotches on her arms. She states she was informed that it could be her platelets.  She states that she has shown these places to Dr. Tomi Bamberger before and wanted to know your thoughts on this. Pt can be reached at [phone].

## 2019-05-28 NOTE — Telephone Encounter (Signed)
Please reassure patient that her platelets were normal on last check.  Unless having increased bruising, bleeding, increased number of these areas, I don't think it needs to be checked again  Lab Results  Component Value Date   WBC 8.2 04/08/2019   HGB 14.8 04/08/2019   HCT 46.0 04/08/2019   MCV 91.8 04/08/2019   PLT 341 04/08/2019

## 2019-06-05 DIAGNOSIS — F332 Major depressive disorder, recurrent severe without psychotic features: Secondary | ICD-10-CM | POA: Diagnosis not present

## 2019-06-05 DIAGNOSIS — F5101 Primary insomnia: Secondary | ICD-10-CM | POA: Diagnosis not present

## 2019-06-05 DIAGNOSIS — F410 Panic disorder [episodic paroxysmal anxiety] without agoraphobia: Secondary | ICD-10-CM | POA: Diagnosis not present

## 2019-06-09 ENCOUNTER — Other Ambulatory Visit: Payer: Self-pay

## 2019-06-09 ENCOUNTER — Other Ambulatory Visit: Payer: Medicare Other | Admitting: Licensed Clinical Social Worker

## 2019-06-09 DIAGNOSIS — Z515 Encounter for palliative care: Secondary | ICD-10-CM

## 2019-06-09 NOTE — Progress Notes (Signed)
COMMUNITY PALLIATIVE CARE SW NOTE  PATIENT NAME: Tracy Malone DOB: January 27, 1932 MRN: [id number]  PRIMARY CARE PROVIDER: [name], MD  RESPONSIBLE PARTY:  Acct ID - Guarantor Home Phone Work Phone Relationship Acct Type  [id number] Tracy [phone] Self P/F     [address], [place], [place] [address]   Due to the COVID-19 crisis, this virtual check-in visit was done via telephone from my office and it was initiated and consent given by this patient and or family.  PLAN OF CARE and INTERVENTIONS:             1. GOALS OF CARE/ ADVANCE CARE PLANNING:  Patient's goal is to stay at home with her husband, Tracy Malone.  He is also a Palliative Care patient.  She is a full code. 2. SOCIAL/EMOTIONAL/SPIRITUAL ASSESSMENT/ INTERVENTIONS:  SW conducted a virtual check-in visit with patient in her home.  She stated her back is not painful, but uncomfortable.  SW provided active listening and supportive counseling.  She talked about continuing to see her psychiatrist.  Her mood sounded much improved.  She is looking forward to a consultation with her physical therapist for water aerobics.  The next visit with SW and RN is scheduled for Friday, 11/20, at 11:30 with patient and her husband. 3. PATIENT/CAREGIVER EDUCATION/ COPING:  Patient copes by expressing her feelings openly. 4. PERSONAL EMERGENCY PLAN:  Patient will contact her family for medical concerns, then her MD. 5. COMMUNITY RESOURCES COORDINATION/ HEALTH CARE NAVIGATION:  Patient has assistance with her housekeeping. 6. FINANCIAL/LEGAL CONCERNS/INTERVENTIONS:  None.     SOCIAL HX:  Social History   Tobacco Use  . Smoking status: Never Smoker  . Smokeless tobacco: Never Used  Substance Use Topics  . Alcohol use: No    Alcohol/week: 0.0 standard drinks    CODE STATUS:  Full Code  ADVANCED DIRECTIVES: N MOST FORM COMPLETE:  N  HOSPICE EDUCATION PROVIDED: N PPS: Appetite is normal.  She ambulates  independently. Duration of visit and documentation:  30 minutes.      Tracy Corn Graylen [place]oa, LCSW

## 2019-06-13 ENCOUNTER — Other Ambulatory Visit: Payer: Medicare Other | Admitting: *Deleted

## 2019-06-13 ENCOUNTER — Other Ambulatory Visit: Payer: Self-pay

## 2019-06-13 ENCOUNTER — Other Ambulatory Visit: Payer: Medicare Other | Admitting: Licensed Clinical Social Worker

## 2019-06-13 DIAGNOSIS — Z515 Encounter for palliative care: Secondary | ICD-10-CM

## 2019-06-16 NOTE — Progress Notes (Signed)
COMMUNITY PALLIATIVE CARE RN NOTE  PATIENT NAME: Tracy Malone DOB: March 30, 1932 MRN: [id number]  PRIMARY CARE PROVIDER: [name], MD  RESPONSIBLE PARTY: Tracy Malone (husband) Acct ID - Guarantor Home Phone Work Phone Relationship Acct Type  [id number] FLOWER, [phone] Self P/F     [address] [place], [place] [address]   Covid-19 Pre-screening Negative  PLAN OF CARE and INTERVENTION:  1. ADVANCE CARE PLANNING/GOALS OF CARE: Goal is for patient to remain at home with her husband. She is a Full code. 2. PATIENT/CAREGIVER EDUCATION: Pain Management, Symptom Management 3. DISEASE STATUS: Joint visit made with Palliative Care SW, Lynn Duffy. Met with patient and her husband, Tracy Malone, in their home. Patient is awake and alert and remains able to engage in appropriate conversation. She reports continued discomfort in her lower back from her kyphoplasty, but says that it is slowly improving. She also has some slight swelling in her lower back. She has a difficult time some nights getting comfortable, which interferes with her sleep. She talked about buying a "L" shaped pillow which she says can help her lie straighter in the bed. She takes Tylenol prn to help. She has an appointment to speak with a water aerobic instructor today in order to strengthen her back. She has several questions for the instructor prior to making a decision whether she will move forward with this. She is ambulatory, but is careful and walks slowly. She is now able to take showers and put her pants on independently, which she was unable to do last month. She is unable to perform any household chores and has a housekeeper to help. Her appetite is improving and her bowel movements have been more regular lately. Her mood has improved with the addition of Prozac to her medication regimen. She continues with occasional "crying episodes" but with less severity. She is not requiring the use of her Xanax as often.  She states that her tremors seem to be improving, and she is not noticing them as often. She continues on Carbidopa-Levodopa 3 times/day. She has started back crocheting some. She is currently working on a hat. Will continue to monitor.  HISTORY OF PRESENT ILLNESS: This is a 85 yo female who resides at home with her husband. Palliative care team continues to follow patient. Will continue to visit monthly and PRN.   CODE STATUS: Full Code ADVANCED DIRECTIVES: Y MOST FORM: no PPS: 50%   PHYSICAL EXAM:   LUNGS: clear to auscultation  CARDIAC: Cor RRR EXTREMITIES: No Edema SKIN: Skin color, texture, turgor normal. No rashes or lesions  NEURO: Alert and oriented x 3, pleasant mood, ambulatory   (Duration of visit and documentation 60 minutes)   [name], RN, BSN

## 2019-06-16 NOTE — Progress Notes (Signed)
COMMUNITY PALLIATIVE CARE SW NOTE  PATIENT NAME: Tracy Malone DOB: 1932-06-22 MRN: [id number]  PRIMARY CARE PROVIDER: [name], MD  RESPONSIBLE PARTY:  Acct ID - Guarantor Home Phone Work Phone Relationship Acct Type  [id number] [name] Self P/F     [address], [place], [place] [address]     PLAN OF CARE and INTERVENTIONS:             1. GOALS OF CARE/ ADVANCE CARE PLANNING:  Patient wishes to remain at home with her husband, [name].  He is also a Palliative Care patient.  She is a full code. 2. SOCIAL/EMOTIONAL/SPIRITUAL ASSESSMENT/ INTERVENTIONS:  SW and Palliative Care RN, [name], met with patient and her husband in their home.  Patient reports less discomfort from her back surgery.  She continues to discuss her relationships with her doctor's.  Provided active listening and supportive counseling regarding their concerns and fears regarding the pandemic. 3. PATIENT/CAREGIVER EDUCATION/ COPING:  Patient copes by venting. 4. PERSONAL EMERGENCY PLAN:  Patient or family contact patient's MD. 5. COMMUNITY RESOURCES [place]:  Patient has assistance with housekeeping. 6. FINANCIAL/LEGAL CONCERNS/INTERVENTIONS:  None.     SOCIAL HX:  Social History   Tobacco Use  . Smoking status: Never Smoker  . Smokeless tobacco: Never Used  Substance Use Topics  . Alcohol use: No    Alcohol/week: 0.0 standard drinks    CODE STATUS:  Full Code  ADVANCED DIRECTIVES: No MOST FORM COMPLETE:  No HOSPICE EDUCATION PROVIDED: No PPS:  Patient's appetite is normal.  She ambulates independently. Duration of visit and documentation:  45 minutes.      Creola Corn Rindy Kollman, LCSW

## 2019-06-18 ENCOUNTER — Encounter: Payer: Self-pay | Admitting: Family Medicine

## 2019-06-23 DIAGNOSIS — H0100A Unspecified blepharitis right eye, upper and lower eyelids: Secondary | ICD-10-CM | POA: Diagnosis not present

## 2019-06-23 DIAGNOSIS — Z961 Presence of intraocular lens: Secondary | ICD-10-CM | POA: Diagnosis not present

## 2019-06-23 DIAGNOSIS — H52203 Unspecified astigmatism, bilateral: Secondary | ICD-10-CM | POA: Diagnosis not present

## 2019-06-23 DIAGNOSIS — H43813 Vitreous degeneration, bilateral: Secondary | ICD-10-CM | POA: Diagnosis not present

## 2019-06-27 DIAGNOSIS — L821 Other seborrheic keratosis: Secondary | ICD-10-CM | POA: Diagnosis not present

## 2019-06-27 DIAGNOSIS — L820 Inflamed seborrheic keratosis: Secondary | ICD-10-CM | POA: Diagnosis not present

## 2019-06-27 DIAGNOSIS — Z85828 Personal history of other malignant neoplasm of skin: Secondary | ICD-10-CM | POA: Diagnosis not present

## 2019-06-30 ENCOUNTER — Ambulatory Visit (INDEPENDENT_AMBULATORY_CARE_PROVIDER_SITE_OTHER): Payer: Medicare Other | Admitting: Physician Assistant

## 2019-06-30 ENCOUNTER — Encounter: Payer: Self-pay | Admitting: Physician Assistant

## 2019-06-30 VITALS — BP 116/60 | HR 80 | Temp 98.0°F | Ht 58.75 in | Wt 103.4 lb

## 2019-06-30 DIAGNOSIS — R140 Abdominal distension (gaseous): Secondary | ICD-10-CM

## 2019-06-30 DIAGNOSIS — R12 Heartburn: Secondary | ICD-10-CM | POA: Diagnosis not present

## 2019-06-30 DIAGNOSIS — K5900 Constipation, unspecified: Secondary | ICD-10-CM | POA: Diagnosis not present

## 2019-06-30 DIAGNOSIS — I6521 Occlusion and stenosis of right carotid artery: Secondary | ICD-10-CM

## 2019-06-30 NOTE — Patient Instructions (Signed)
If you are age 83 or older, your body mass index should be between 23-30. Your Body mass index is 21.06 kg/m. If this is out of the aforementioned range listed, please consider follow up with your Primary Care Provider.  If you are age 60 or younger, your body mass index should be between 19-25. Your Body mass index is 21.06 kg/m. If this is out of the aformentioned range listed, please consider follow up with your Primary Care Provider.   STOP Omeprazole. Start Pepcid 40 mg at bedtime.  If heartburn recurs then restart Omeprazole.  Start Gas-X before meals.  Use Miralax every other day.  Follow up as needed.  Thank you for choosing me and [facility].    Ellouise Newer, PA-C

## 2019-06-30 NOTE — Progress Notes (Signed)
Chief Complaint: Follow-up constipation  HPI:    Mrs. Tracy Malone is an 85 year old female, known to Dr. Ardis Hughs, who returns to clinic today for follow-up of her constipation.    Please recall patient had been seen 04/11/2019 for follow-up after being seen in the ER for abdominal pain.  CT showed no findings to account for pain.  Labs are normal.  That time described abdominal bloating and discomfort along with burning 10 to 15 minutes after she ate anything.  Also described constipation.  It is noted that her Parkinson's is likely contributing.  At that time she was prescribed omeprazole 20 mg daily and Linzess 72 mcg daily.    04/22/2019 patient continued to complain of burning in her omeprazole was increased to twice daily dosing and she was told to use MiraLAX twice a day after the Linzess gave her diarrhea.    04/28/2019 patient seen for follow-up and at that time had no further episodes of burning across her abdomen since she been using the omeprazole 20 mg twice daily.  She did have occasional breakthrough for which she is Gaviscon.  In general she felt weak after her back surgery.  Constipation was better with MiraLAX every other day per patient.  At that time it was discussed that she would continue omeprazole 20 mg twice daily but it was also noted she had a history of osteoporosis and we could discuss decreasing her omeprazole at the next visit.  Pending breakthroughs.  Also continue on MiraLAX every other day.    Today, the patient returns to clinic accompanied by her husband who does assist with her history.  She explains that she is now only taking her Omeprazole 20 mg at night and has had no further burning or epigastric discomfort but does feel like she is "really full all the time" and bloated.  Feels like the food just kind of sits in her stomach and does not really digest.  Denies nausea.     Also tells me she has stopped taking the MiraLAX.  She is having a bowel movement about every day but  sometimes still feels bloated and full and incompletely relieved afterwards.  Also describes occasional lower abdominal cramping pains which come on before a bowel movement and are relieved afterwards.  Describes a lot of gas for which she takes Gas-X when she has symptoms, does not use this with meals.    In general patient continues to be troubled after her back surgery.  She is graduating from aquatic therapy to land therapy within the next week and is excited to be able to start doing things like going up and down the stairs.  Social history positive for the fact that her husband has a hobby of painting rocks.  Patient also desires a haircut.    Denies fever, chills, weight loss or vomiting.  Past Medical History:  Diagnosis Date   Bell's palsy 1966   Hx: right side facial droop, resolved per patient 04/02/19   Carotid artery disease ([place]) 2010   on vascular screening;unchanged 2013.(could not tolerate simvastatin, no other statins tried)--<30% blockage bilat 07/2011   Chronic abdominal pain    Chronic fatigue and malaise    Claustrophobia    Depression    treated in the past for years;stopped in 2010 for a years   Duodenal ulcer 1962   h/o   Dysrhythmia    ocassional PVC's, no current problems per patient on 04/02/19   Fibromyalgia    Frequent PVCs 07/2012  Seen by [facility]: benign, asymptomatic, normal EF   GERD (gastroesophageal reflux disease)    diet controlled   Glaucoma, narrow-angle    s/p laser surgery   History of hiatal hernia    during endoscopy   Hypothyroid 9/08   IBS (irritable bowel syndrome)    Dr. Benson Norway   Ischemic colitis ([place]) 11/21/2018   no current problems per patient on 04/02/19   Ocular migraine    Osteoporosis 10/11   Dr.Hawkes   Panic attack    Parkinson disease ([place])    Recurrent UTI    has cystocele-Dr.Grewal   Shingles 1999   h/o   Superficial thrombophlebitis 03/2009   RLE   Trochanteric bursitis 12/2008   bilateral     Past Surgical History:  Procedure Laterality Date   ABDOMINAL HYSTERECTOMY     CATARACT EXTRACTION, BILATERAL  1995, 1996   EYE SURGERY Bilateral    laser - glaucoma   Flexible sigmoidoscopy     KYPHOPLASTY N/A 04/03/2019   Procedure: KYPHOPLASTY L1;  Surgeon: Melina Schools, MD;  Location: [facility];  Service: Orthopedics;  Laterality: N/A;  90 mins   THYROIDECTOMY, PARTIAL  09/2005   L nodule; Dr. Harlow Asa   TONSILLECTOMY  1946   UPPER GI ENDOSCOPY  06/27/12   VAGINAL HYSTERECTOMY  1971   and bladder repair.  Still has ovaries   WISDOM TOOTH EXTRACTION      Current Outpatient Medications  Medication Sig Dispense Refill   acetaminophen (TYLENOL) 500 MG tablet Take 500-1,000 mg by mouth 3 (three) times daily as needed (pain.).      ALPRAZolam (XANAX) 0.5 MG tablet TAKE BY MOUTH UP TO 3 TIMES DAILY. WARNING: BENZODIAZEPINES INCREASE THE RISK OF FALLS AND NEUROCOGNITIVE DECLINE IN THE ELDERLY (Patient taking differently: Take 0.5-1 mg by mouth See admin instructions. Take 1 tablet (0.5 mg) by mouth twice daily & take 2 tablets(1 mg) by mouth in the evening.) 90 tablet 2   calcium carbonate (OSCAL) 1500 (600 Ca) MG TABS tablet Take 600 mg by mouth 2 (two) times daily.     Carbidopa-Levodopa ER (SINEMET CR) 25-100 MG tablet controlled release Take 1 tablet by mouth 4 (four) times daily. (Patient taking differently: Take 1 tablet by mouth 3 (three) times daily before meals. ) 360 tablet 1   Cholecalciferol (VITAMIN D) 2000 UNITS tablet Take 2,000 Units by mouth daily.     docusate sodium (COLACE) 100 MG capsule Take 100 mg by mouth 2 (two) times daily.     linaclotide (LINZESS) 72 MCG capsule Take 1 capsule (72 mcg total) by mouth daily before breakfast. (Patient not taking: Reported on 04/28/2019) 30 capsule 1   omeprazole (PRILOSEC) 20 MG capsule Take 1 capsule (20 mg total) by mouth 2 (two) times daily before a meal. 60 capsule 3   polyethylene glycol (MIRALAX / GLYCOLAX) 17  g packet Take 17 g by mouth daily.     Probiotic Product (ALIGN PO) Take 1 capsule by mouth daily.      SYNTHROID 25 MCG tablet TAKE 1 TABLET DAILY BEFORE BREAKFAST 90 tablet 1   No current facility-administered medications for this visit.     Allergies as of 06/30/2019 - Review Complete 04/28/2019  Allergen Reaction Noted   Iodine Anaphylaxis 05/13/2012   Levsin [hyoscyamine sulfate]  10/13/2015   Salmon [fish allergy] Hives and Shortness Of Breath 05/13/2012   Shellfish allergy Anaphylaxis 05/13/2012   Tramadol Swelling 04/11/2019   Remeron [mirtazapine] Other (See Comments) 08/25/2016  Aspirin Other (See Comments) 05/13/2012   Ciprofloxacin Diarrhea 05/13/2012   Codeine Nausea And Vomiting 05/13/2012   Darvocet [propoxyphene n-acetaminophen] Nausea And Vomiting 05/13/2012   Demerol  [meperidine hcl]  02/20/2019   Demerol [meperidine] Nausea Only 05/13/2012   Dexilant [dexlansoprazole] Swelling 03/01/2016   Diphedryl [diphenhydramine] Other (See Comments) 05/13/2012   Doxycycline hyclate Other (See Comments) 05/13/2012   Epinephrine Other (See Comments) 05/13/2012   Erythromycin Other (See Comments) 05/13/2012   Flexeril [cyclobenzaprine] Other (See Comments) 05/13/2012   Keflex [cephalexin] Hives 05/13/2012   Latex Other (See Comments) 05/13/2012   Nitrofurantoin Diarrhea 11/22/2018   Other  02/20/2019   Prednisone Other (See Comments) 05/13/2012   Pylera [bis subcit-metronid-tetracyc] Swelling 08/01/2012   Sulfa antibiotics Other (See Comments) 05/13/2012   Xylocaine [lidocaine hcl]  05/13/2012   Zoloft [sertraline hcl] Swelling and Other (See Comments) 05/13/2012   Advil [ibuprofen] Other (See Comments) 05/13/2012   Clarithromycin Rash 01/14/2015    Family History  Problem Relation Age of Onset   Heart disease Mother    Hypertension Mother    Hypertension Sister    HIV Son    Heart disease Brother    Lung cancer Brother         lung   Diabetes Maternal Grandfather    Diabetes Granddaughter        type 1    Social History   Socioeconomic History   Marital status: Married    Spouse name: Gwyndolyn Saxon   Number of children: 2   Years of education: Not on file   Highest education level: Not on file  Occupational History   Occupation: retired (school system)  Social Needs   Emergency planning/management officer strain: Not on file   Food insecurity    Worry: Not on file    Inability: Not on Lexicographer needs    Medical: Not on file    Non-medical: Not on file  Tobacco Use   Smoking status: Never Smoker   Smokeless tobacco: Never Used  Substance and Sexual Activity   Alcohol use: No    Alcohol/week: 0.0 standard drinks   Drug use: No   Sexual activity: Not Currently    Partners: Male    Birth control/protection: Other-see comments    Comment: Hysterectomy  Lifestyle   Physical activity    Days per week: Not on file    Minutes per session: Not on file   Stress: Not on file  Relationships   Social connections    Talks on phone: Not on file    Gets together: Not on file    Attends religious service: Not on file    Active member of club or organization: Not on file    Attends meetings of clubs or organizations: Not on file    Relationship status: Not on file   Intimate partner violence    Fear of current or ex partner: Not on file    Emotionally abused: Not on file    Physically abused: Not on file    Forced sexual activity: Not on file  Other Topics Concern   Not on file  Social History Narrative   Married.  Son lives in [place]; Daughter [name] lives in [place]; 2 grandchildren    Review of Systems:    Constitutional: No weight loss, fever or chills Cardiovascular: No chest pain Respiratory: No SOB  Gastrointestinal: See HPI and otherwise negative   Physical Exam:  Vital signs: BP 116/60 (BP Location: Left Arm, Patient Position: Sitting,  Cuff Size: Normal)    Pulse 80    Temp 98  F (36.7 C)    Ht 4' 10.75" (1.492 m)    Wt 103 lb 6 oz (46.9 kg)    LMP  (LMP Unknown)    BMI 21.06 kg/m   Constitutional:   Pleasant Elderly Caucasian female appears to be in NAD, Well developed, Well nourished, alert and cooperative Respiratory: Respirations even and unlabored. Lungs clear to auscultation bilaterally.   No wheezes, crackles, or rhonchi.  Cardiovascular: Normal S1, S2. No MRG. Regular rate and rhythm. No peripheral edema, cyanosis or pallor.  Gastrointestinal:  Soft, nondistended, nontender. No rebound or guarding. Normal bowel sounds. No appreciable masses or hepatomegaly. Psychiatric: Demonstrates good judgement and reason without abnormal affect or behaviors.  No recent labs or imaging.  Assessment: 1.  Constipation: Incomplete bowel movements every day with bloating and some gas lower abdominal cramping 2.  Bloating: Likely related to constipation 3.  Heartburn: Controlled with Omeprazole 20 mg daily, no breakthrough symptoms, is having trouble with digestion, will try to decrease Omeprazole  Plan: 1.  Discussed with patient that we should try to lower her Omeprazole if possible to see if this helps with digestion.  Recommend she discontinue this and start Pepcid 40 mg nightly instead.  If she starts back with heartburn symptoms she can restart her Omeprazole.  This would also be wise given her history of osteoporosis. 2.  Would recommend the patient restart her MiraLAX every other day to ensure that she is getting good complete bowel movements that are not adding to her bloating and gas.  Discussed titration of this. 3.  Discussed with the patient that if she uses Gas-X it should be with her right before/ with a  meal.  This will help it to be more effective. 4.  Patient to follow in clinic with me or Dr. Ardis Hughs as needed in the near future.  Ellouise Newer, PA-C [facility] 06/30/2019, 2:09 PM  Cc: [name], MD

## 2019-07-01 ENCOUNTER — Telehealth: Payer: Self-pay | Admitting: *Deleted

## 2019-07-01 NOTE — Telephone Encounter (Signed)
Contacted and spoke with patient to arrange a home visit. Visit scheduled for Wednesday, 07/09/19 at 11:00 am.

## 2019-07-01 NOTE — Progress Notes (Signed)
I agree with the above note, plan 

## 2019-07-03 ENCOUNTER — Encounter: Payer: Self-pay | Admitting: Family Medicine

## 2019-07-09 ENCOUNTER — Other Ambulatory Visit: Payer: Self-pay

## 2019-07-09 ENCOUNTER — Other Ambulatory Visit: Payer: Medicare Other | Admitting: Licensed Clinical Social Worker

## 2019-07-09 ENCOUNTER — Other Ambulatory Visit: Payer: Medicare Other | Admitting: *Deleted

## 2019-07-09 DIAGNOSIS — Z515 Encounter for palliative care: Secondary | ICD-10-CM

## 2019-07-09 NOTE — Progress Notes (Signed)
COMMUNITY PALLIATIVE CARE SW NOTE  PATIENT NAME: Tracy Malone DOB: 1931/12/21 MRN: [id number]  PRIMARY CARE PROVIDER: [name], MD  RESPONSIBLE PARTY:  Acct ID - Guarantor Home Phone Work Phone Relationship Acct Type  [id number] [name] Self P/F     [address], [place], [place] [address]   Due to the COVID-19 crisis, this virtual check-in visit was done via telephone from my office and it was initiated and consent given by this patient and or family.   PLAN OF CARE and INTERVENTIONS:             1. GOALS OF CARE/ ADVANCE CARE PLANNING:  Goal is for patient to remain in her home with her husband, [name], who is also a Palliative Care patient.  She is a full code. 2. SOCIAL/EMOTIONAL/SPIRITUAL ASSESSMENT/ INTERVENTIONS:  SW and Palliative Care RN, [name], conducted a virtual check-in visit with patient and her husband.  Patient reports being extremely sore from her water physical therapy.  She was crying intermittently during the visit when discussing her son.  Her husband stated he gets "upset" when she cries and attempts to improve her mood, which is minimally successful.  She reports not feeling herself since she received ECT several years ago.  SW provided active listening and supportive counseling.  Explored when she is sad and how these feelings impact her life. 3. PATIENT/CAREGIVER EDUCATION/ COPING:  Patient copes by venting. 4. PERSONAL EMERGENCY PLAN:  Patient's MD will be notified. 5. COMMUNITY RESOURCES COORDINATION/ HEALTH CARE NAVIGATION:  She has assistance with housekeeping. 6. FINANCIAL/LEGAL CONCERNS/INTERVENTIONS:  None.     SOCIAL HX:  Social History   Tobacco Use  . Smoking status: Never Smoker  . Smokeless tobacco: Never Used  Substance Use Topics  . Alcohol use: No    Alcohol/week: 0.0 standard drinks    CODE STATUS:  Full Code  ADVANCED DIRECTIVES: No MOST FORM COMPLETE:  No HOSPICE EDUCATION PROVIDED: No PPS:   Patient reported no change in appetite.  She is able to ambulate independently. Duration of visit and documentation:  45 minutes.      Creola Corn Rollan Roger, LCSW

## 2019-07-10 ENCOUNTER — Telehealth: Payer: Self-pay | Admitting: Hematology and Oncology

## 2019-07-10 NOTE — Telephone Encounter (Signed)
[facility] transfer to [place]. Confirmed January appointment with patient.  °

## 2019-07-10 NOTE — Progress Notes (Signed)
COMMUNITY PALLIATIVE CARE RN NOTE  PATIENT NAME: Tracy Malone DOB: 07/12/1932 MRN: [id number]  PRIMARY CARE PROVIDER: [name], MD  RESPONSIBLE PARTY: [name] (husband) Acct ID - Guarantor Home Phone Work Phone Relationship Acct Type  [id number] SHAWNTRICE, [phone] Self P/F     [address], [place], [place] [address]   Due to the COVID-19 crisis, this virtual check-in visit was done via telephone from my office and it was initiated and consent by this patient and or family.  PLAN OF CARE and INTERVENTION:  1. ADVANCE CARE PLANNING/GOALS OF CARE: Goal is for patient to be able to move around better without pain. She is a Full code. 2. PATIENT/CAREGIVER EDUCATION: Safe Mobility/Transfers 3. DISEASE STATUS: Joint virtual check-in visit completed via telephone. Spoke with both patient and her husband, [name]. Patient reports that she has been going to water aerobic therapy. She has had about 3 sessions. She states that after the sessions she has been experiencing generalized soreness and increased pain in her left side. She says her back and left side hurts even when she takes a deep breath. She is having difficulty getting comfortable in any position. She is taking Tylenol, but it is not effective. She is unable to take Ibuprofen and she doesn't like how Tramadol has made her feel in the past. She tried using Bengay on the area, but it was applied after using the heating pad and says that she thinks this is the reason the medication caused a burning sensation on her back and she had to wipe it off. She was up at 5am this morning and had difficulties falling back asleep d/t pain. She feels that she may be better off doing regular physical therapy sessions. She is able to dress, shower and toilet herself, but takes some time. She remains ambulatory, but says she is unable to perform household chores. She is tearful today. I believe that her discomfort is contributing and  exacerbating her sad mood because she says everything she does causes pain and she wants to be more independent. She will reach out to her Ortho MD if her side pain does not improve.  She also speaks of her head feeling "weird" at times when she wakes up in the mornings. This feeling is difficult for her to describe, but it is not pain. This feeling usually subsides on it's own. She goes back to when she received ECT treatments in the past and feels some of her head issues started after this. She continues on Xanax for her anxiety. She also continues on Prozac. When she doesn't remember things, this bothers and frustrates her. She feels that her tremors have improved, but is having difficulties writing. She continues on Carbidopa/Levadopa for her Parkinson's. She is appreciative of visit and Palliative support. Will continue to monitor.  HISTORY OF PRESENT ILLNESS: This is a 85 yo female who resides at home with her husband. Palliative care team continues to follow patient. Will continue with monthly and PRN visits.   CODE STATUS: Full Code ADVANCED DIRECTIVES: Y MOST FORM: no PPS: 50%   (Duration of visit and documentation 60 minutes)   [name], RN BSN

## 2019-07-11 ENCOUNTER — Ambulatory Visit
Admission: RE | Admit: 2019-07-11 | Discharge: 2019-07-11 | Disposition: A | Discharge status: Home / Self Care | Payer: Medicare Other | Source: Ambulatory Visit | Attending: Family Medicine | Admitting: Family Medicine

## 2019-07-11 ENCOUNTER — Other Ambulatory Visit: Payer: Self-pay

## 2019-07-11 DIAGNOSIS — Z1231 Encounter for screening mammogram for malignant neoplasm of breast: Secondary | ICD-10-CM

## 2019-07-15 ENCOUNTER — Telehealth: Payer: Self-pay | Admitting: Physician Assistant

## 2019-07-15 NOTE — Telephone Encounter (Signed)
Called patient back, and she states she has no appetite, pain across her upper abdomin, mushy stools, a lot of gas and wakes up at night all sweaty. Says no fever, has tried gas-x with no relief. Looking for some advise.

## 2019-07-16 NOTE — Telephone Encounter (Signed)
Please ask if she discontinued her Omeprazole as discussed at last visit.  If she did and had switched to Pepcid as we discussed then would recommend she switch back to Omeprazole 20 mg 30 min before dinner  It could be that it is her gastritis causing all the symptoms.  Please let me know, Thank you, Ellouise Newer, PA-C

## 2019-07-16 NOTE — Telephone Encounter (Signed)
Called patient and she said she had already switched back to the Omperazole about 1 week ago. States she had back surgery in Sept.of this year and is still getting PT for it. She had PT yesterday and it was brought up that it could be related to her GI symptoms ? Stated last night was a little better and that she would give it 1 more week and let us know how she is doing

## 2019-07-21 ENCOUNTER — Encounter: Payer: Self-pay | Admitting: Hematology and Oncology

## 2019-07-22 ENCOUNTER — Telehealth: Payer: Self-pay | Admitting: Physician Assistant

## 2019-07-22 NOTE — Telephone Encounter (Signed)
On review of notes, was seen by PA Lemmon earlier this month and restarted on Miralax daily. Is she still taking this daily? If so, recommend decreasing to 1/2 cap/day or QOD. If she has already stopped but still with sxs, start fiber supplement such as Benefiber or Citrucel.

## 2019-07-22 NOTE — Telephone Encounter (Signed)
Patient's husband called and is very concerned about his wife. She has not been eating much at all because when she does it goes right through her. She has 3-4 mushy BMs a day. He stated he thinks it is her IBS flaring up. No fever. No n/v. As DOD please advise

## 2019-07-22 NOTE — Telephone Encounter (Signed)
Called patient, and she has not been taking the Miralax. Told her Dr.Cirigliano would like her to try Benefiver or Citrucel OTC and if no improvement in 2 days to call us back.

## 2019-07-28 ENCOUNTER — Ambulatory Visit (INDEPENDENT_AMBULATORY_CARE_PROVIDER_SITE_OTHER): Payer: Medicare Other | Admitting: Family Medicine

## 2019-07-28 ENCOUNTER — Other Ambulatory Visit: Payer: Self-pay

## 2019-07-28 ENCOUNTER — Encounter: Payer: Self-pay | Admitting: Family Medicine

## 2019-07-28 VITALS — BP 140/80 | HR 84 | Temp 97.7°F | Ht 59.0 in | Wt 102.0 lb

## 2019-07-28 DIAGNOSIS — R109 Unspecified abdominal pain: Secondary | ICD-10-CM | POA: Diagnosis not present

## 2019-07-28 DIAGNOSIS — M545 Low back pain, unspecified: Secondary | ICD-10-CM

## 2019-07-28 DIAGNOSIS — N3000 Acute cystitis without hematuria: Secondary | ICD-10-CM

## 2019-07-28 DIAGNOSIS — G8929 Other chronic pain: Secondary | ICD-10-CM

## 2019-07-28 LAB — POCT URINALYSIS DIP (PROADVANTAGE DEVICE)
Bilirubin, UA: NEGATIVE
Blood, UA: NEGATIVE
Glucose, UA: NEGATIVE mg/dL
Ketones, POC UA: NEGATIVE mg/dL
Nitrite, UA: NEGATIVE
Protein Ur, POC: NEGATIVE mg/dL
Specific Gravity, Urine: 1
Urobilinogen, Ur: NEGATIVE
pH, UA: 6 (ref 5.0–8.0)

## 2019-07-28 NOTE — Patient Instructions (Signed)
Take the macrobid until we let you know to stop it (if the culture stated it was completely resistant (and not intermediate), then it should be stopped; if intermediate, complete the last 4 pills). I suspect you may have colonization of the bladder with bacteria rather than a true infection, in which case more antibiotics will just make you feel worse.  If you truly have an infection, and once you stop the macrobid, if you develop additional symptoms (burning with urination, odor, blood in the urine, urinary urgency, frequency, fever), then we should start one of the antibiotics that the culture showed would be effective (tetracycline or doxycycline).. I will be calling over to get the results once their office opens (closed until 2pm).

## 2019-07-28 NOTE — Progress Notes (Signed)
Chief Complaint  Patient presents with  . Abdominal Pain    and discomfort. Went to Dr Christen Butter office 12/28 and didn't see the doctor, just gave sample and she thinks they cultured it. Started taking macrobid BID.    Patient presents possibly UTI.   She developed abdominal discomfort and low back pain, and was worried it could be a UTI, so saw her GYN (didn't see MD).  Reportedly a few different ABX were sent in, and she chose to start macrobid.  She has 4 pills left.  She got branded macrobid (got diarrhea from generic nitrofurantoin).  She paid $96, and hasn't had any diarrhea.  She reports that their office called her Wed with the results, was told it was resistant to macrobid, and to change to TCN, but it was too expensive. She chose to stay with the macrobid.  Currently only has pain in low back, on either side. No abdominal discomfort.  She had low back pain and lower abdominal pain.  She did not have any dysuria, no hematuria, no odor or cloudiness. Never had any fever or flank pain.  Also having ongoing GI issues.  Messages reviewed in the chart to GI, reporting 3-4 mushy BMs/day, she wasn't eating much, as it was "going right through her". She is having lower abdominal discomfort.  She was advised to start fiber supplement such as Benefiber or Citrucel.  She is not taking miralax. She hasn't started any fiber.  She had a normal bowel movement.  PMH, PSH, SH reviewed  Outpatient Encounter Medications as of 07/28/2019  Medication Sig  . ALPRAZolam (XANAX) 0.5 MG tablet TAKE BY MOUTH UP TO 3 TIMES DAILY. WARNING: BENZODIAZEPINES INCREASE THE RISK OF FALLS AND NEUROCOGNITIVE DECLINE IN THE ELDERLY (Patient taking differently: Take 0.5-1 mg by mouth See admin instructions. Take 1 tablet (0.5 mg) by mouth twice daily & take 2 tablets(1 mg) by mouth in the evening.)  . calcium carbonate (OSCAL) 1500 (600 Ca) MG TABS tablet Take 600 mg by mouth 2 (two) times daily.  . Carbidopa-Levodopa ER  (SINEMET CR) 25-100 MG tablet controlled release Take 1 tablet by mouth 4 (four) times daily. (Patient taking differently: Take 1 tablet by mouth 3 (three) times daily before meals. )  . Cholecalciferol (VITAMIN D) 2000 UNITS tablet Take 2,000 Units by mouth daily.  . nitrofurantoin, macrocrystal-monohydrate, (MACROBID) 100 MG capsule Macrobid 100 mg capsule  Take 1 capsule twice a day by oral route for 7 days.  Marland Kitchen omeprazole (PRILOSEC) 20 MG capsule Take 1 capsule (20 mg total) by mouth 2 (two) times daily before a meal.  . Probiotic Product (ALIGN PO) Take 1 capsule by mouth daily.   Marland Kitchen SYNTHROID 25 MCG tablet TAKE 1 TABLET DAILY BEFORE BREAKFAST  . acetaminophen (TYLENOL) 500 MG tablet Take 500-1,000 mg by mouth 3 (three) times daily as needed (pain.).   . [DISCONTINUED] FLUoxetine (PROZAC) 10 MG tablet Take 10 mg by mouth daily.   No facility-administered encounter medications on file as of 07/28/2019.    Allergies  Allergen Reactions  . Iodine Anaphylaxis    IV and topical forms.  Burnard Leigh [Hyoscyamine Sulfate]     Vision problems/pt has glaucoma  . Salmon [Fish Allergy] Hives and Shortness Of Breath  . Shellfish Allergy Anaphylaxis  . Tramadol Swelling  . Remeron [Mirtazapine] Other (See Comments)    Cause blurred vision and red eyes, pt has glaucoma  . Aspirin Other (See Comments)    Sever stomach pain due to  ulcer scaring.  . Ciprofloxacin Diarrhea  . Codeine Nausea And Vomiting  . Darvocet [Propoxyphene N-Acetaminophen] Nausea And Vomiting  . Demerol  [Meperidine Hcl]   . Demerol [Meperidine] Nausea Only  . Dexilant [Dexlansoprazole] Swelling    Redness, swelling and peeling of both feet.  . Diphedryl [Diphenhydramine] Other (See Comments)    Increased pulse/small amount ok  . Doxycycline Hyclate Other (See Comments)    GI intolerance.  Marland Kitchen Epinephrine Other (See Comments)    Breathing problems  . Erythromycin Other (See Comments)    GI intolerance.  Yvette Rack  [Cyclobenzaprine] Other (See Comments)    Tingly/prickly sensation.  Marland Kitchen Keflex [Cephalexin] Hives  . Latex Other (See Comments)    Gloves ok.  Skin gets red from elastic in underwear and latex bandaides.  . Nitrofurantoin Diarrhea  . Other   . Prednisone Other (See Comments)    Headache  . Pylera [Bis Subcit-Metronid-Tetracyc] Swelling    Tongue swelling. Face tingling  . Sulfa Antibiotics Other (See Comments)    Increased pulse, fainting, diarrhea, thrush  . Xylocaine [Lidocaine Hcl]     With epinephrine, given by dentist.  Speeded up heart rate and she passed out (occured twice, at dentist)  . Zoloft [Sertraline Hcl] Swelling and Other (See Comments)    Migraine Swelling of tongue/lip (09/2012)  . Advil [Ibuprofen] Other (See Comments)    Motrin ok with a GI effect.  . Biaxin [Clarithromycin] Rash    Started after completing 10 day course of 2000 mg /day, Lips swelling   ROS: no fever, chills, dysuria, hematuria.  Lower abdominal discomfort is not currently present. Denies flank pain, has some pain at either side of lower back (which she has had since her surgery).  She denies nausea, vomiting or diarrhea.  Some decreased appetite.   PHYSICAL EXAM:  BP 140/80   Pulse 84   Temp 97.7 F (36.5 C) (Other (Comment))   Ht 4\' 11"  (1.499 m)   Wt 102 lb (46.3 kg)   LMP  (LMP Unknown)   BMI 20.60 kg/m   Wt Readings from Last 3 Encounters:  07/28/19 102 lb (46.3 kg)  06/30/19 103 lb 6 oz (46.9 kg)  04/11/19 101 lb (45.8 kg)   Elderly female, accompanied by her husband.  She is in no distress, though doesn't appear to be in great spirits today. HEENT: conjunctiva and sclera are clear, EOMI. Wearing mask and hat in office Back: no spinal or CVA tenderness (area of discomfort is lower lumbar back in muscular area, no spasm noted). Abdomen: soft.  Mild epigastric tenderness. No suprapubic tenderness Neuro: alert and oriented, normal gait.  Trace leuks, SG 1.000  Eventually was  able to get urine culture results from GYN office--Klebsiella, resistant to macrobid.  ASSESSMENT/PLAN:  Acute cystitis without hematuria - Klebsiella on culture; unclear if this is asymptomatic bacteriuria, vs true UTI given vague sx (which improved with using resistant ABX). Can stop ABX  Abdominal pain, unspecified abdominal location - resolved.  - Plan: POCT Urinalysis DIP (Proadvantage Device)  Chronic bilateral low back pain without sciatica - reassure not related to UTI or kidneys.  cont heat prn   .Marland Kitchen

## 2019-07-29 ENCOUNTER — Other Ambulatory Visit: Payer: Medicare Other

## 2019-07-29 ENCOUNTER — Ambulatory Visit: Payer: Medicare Other | Admitting: Hematology and Oncology

## 2019-07-30 ENCOUNTER — Ambulatory Visit: Payer: Medicare Other | Admitting: Hematology and Oncology

## 2019-07-30 ENCOUNTER — Other Ambulatory Visit: Payer: Medicare Other

## 2019-07-30 DIAGNOSIS — R262 Difficulty in walking, not elsewhere classified: Secondary | ICD-10-CM | POA: Diagnosis not present

## 2019-07-30 DIAGNOSIS — S32000D Wedge compression fracture of unspecified lumbar vertebra, subsequent encounter for fracture with routine healing: Secondary | ICD-10-CM | POA: Diagnosis not present

## 2019-07-30 DIAGNOSIS — M545 Low back pain: Secondary | ICD-10-CM | POA: Diagnosis not present

## 2019-08-05 ENCOUNTER — Other Ambulatory Visit: Payer: Self-pay

## 2019-08-05 ENCOUNTER — Other Ambulatory Visit: Payer: Medicare Other | Admitting: Licensed Clinical Social Worker

## 2019-08-05 DIAGNOSIS — Z515 Encounter for palliative care: Secondary | ICD-10-CM

## 2019-08-06 NOTE — Progress Notes (Signed)
COMMUNITY PALLIATIVE CARE SW NOTE  PATIENT NAME: Tracy Malone DOB: 06-25-1932 MRN: [id number]  PRIMARY CARE PROVIDER: [name], MD  RESPONSIBLE PARTY:  Acct ID - Guarantor Home Phone Work Phone Relationship Acct Type  [id number] [name] Self P/F     [address], [place], [place] [address]   Due to the COVID-19 crisis, this virtual check-in visit was done via telephone from my office and it was initiated and consent given by this patientand orfamily.  PLAN OF CARE and INTERVENTIONS:             1. GOALS OF CARE/ ADVANCE CARE PLANNING:  Patient's goal is to remain at home with her husband, [name].  He is also a Palliative Care patient.  Patient is a full code. 2. SOCIAL/EMOTIONAL/SPIRITUAL ASSESSMENT/ INTERVENTIONS:  SW conducted a virtual check-in visit with patient in her home, after speaking with Bill.  SW provided active listening and supportive counseling while she talked about him falling down the stairs last Sunday.  She was very concerned about him.  She said she will have her daughter take patient to the MD tomorrow.  Patient was lying in bed on a heating pad.  She complained of still having discomfort since her back surgery.  She talked about hurting her toe during water aerobics.   3. PATIENT/CAREGIVER EDUCATION/ COPING:  Patient copes by expressing her feelings freely. 4. PERSONAL EMERGENCY PLAN:  She contacts her MD. 5. COMMUNITY RESOURCES COORDINATION/ HEALTH CARE NAVIGATION:  The couple has assistance with housekeeping. 6. FINANCIAL/LEGAL CONCERNS/INTERVENTIONS:  None.     SOCIAL HX:  Social History   Tobacco Use  . Smoking status: Never Smoker  . Smokeless tobacco: Never Used  Substance Use Topics  . Alcohol use: No    Alcohol/week: 0.0 standard drinks    CODE STATUS:  Full Code ADVANCED DIRECTIVES:  No MOST FORM COMPLETE:  No HOSPICE EDUCATION PROVIDED:  No PPS:   She did not report any change in her appetite.  She ambulates  independently. Duration of visit and documentation:  30 minutes.      Creola Corn Rande Dario, LCSW

## 2019-08-07 ENCOUNTER — Encounter: Payer: Self-pay | Admitting: Family Medicine

## 2019-08-08 DIAGNOSIS — R262 Difficulty in walking, not elsewhere classified: Secondary | ICD-10-CM | POA: Diagnosis not present

## 2019-08-08 DIAGNOSIS — M545 Low back pain: Secondary | ICD-10-CM | POA: Diagnosis not present

## 2019-08-08 DIAGNOSIS — S32000D Wedge compression fracture of unspecified lumbar vertebra, subsequent encounter for fracture with routine healing: Secondary | ICD-10-CM | POA: Diagnosis not present

## 2019-08-11 DIAGNOSIS — R262 Difficulty in walking, not elsewhere classified: Secondary | ICD-10-CM | POA: Diagnosis not present

## 2019-08-11 DIAGNOSIS — S32000D Wedge compression fracture of unspecified lumbar vertebra, subsequent encounter for fracture with routine healing: Secondary | ICD-10-CM | POA: Diagnosis not present

## 2019-08-11 DIAGNOSIS — M545 Low back pain: Secondary | ICD-10-CM | POA: Diagnosis not present

## 2019-08-12 ENCOUNTER — Encounter: Payer: Self-pay | Admitting: Neurology

## 2019-08-12 ENCOUNTER — Ambulatory Visit (INDEPENDENT_AMBULATORY_CARE_PROVIDER_SITE_OTHER): Payer: Medicare Other | Admitting: Neurology

## 2019-08-12 ENCOUNTER — Other Ambulatory Visit: Payer: Self-pay

## 2019-08-12 VITALS — BP 129/61 | HR 74 | Temp 97.3°F | Ht 59.0 in

## 2019-08-12 DIAGNOSIS — G20 Parkinson's disease: Secondary | ICD-10-CM

## 2019-08-12 DIAGNOSIS — G250 Essential tremor: Secondary | ICD-10-CM

## 2019-08-12 DIAGNOSIS — R269 Unspecified abnormalities of gait and mobility: Secondary | ICD-10-CM

## 2019-08-12 DIAGNOSIS — R350 Frequency of micturition: Secondary | ICD-10-CM | POA: Diagnosis not present

## 2019-08-12 DIAGNOSIS — S32010S Wedge compression fracture of first lumbar vertebra, sequela: Secondary | ICD-10-CM

## 2019-08-12 MED ORDER — CARISOPRODOL 350 MG PO TABS: ORAL_TABLET | ORAL | 0 refills | Status: DC

## 2019-08-12 MED ORDER — CARBIDOPA-LEVODOPA ER 25-100 MG PO TBCR: 1.0000 | EXTENDED_RELEASE_TABLET | Freq: Four times a day (QID) | ORAL | 3 refills | Status: DC

## 2019-08-12 NOTE — Progress Notes (Signed)
[facility]  PATIENT: Tracy Malone DOB: [birth year]  REFERRING DOCTOR OR PCP:  Joselyn Arrow SOURCE: patient and EMR records  _________________________________   HISTORICAL  CHIEF COMPLAINT:  Chief Complaint  Patient presents with  . Follow-up    RM 12, with husband (temp: 97.5). Last seen 02/06/2019. Here today in WC. She fractured vertebrae in back 03/2019. Had surgery for this 04/2019. Still doing PT 2x/week at [facility] on Monday/Wed with Delice Bison.     HISTORY OF PRESENT ILLNESS:  Tracy Malone is an 86 year old woman with tremor and recent L1 fracture.    Update 08/12/2019: She fractured the L1 vertebrae September 2020 while moving a mattress and had kyphoplasty 04/03/2019.   She feels she got a little better but is still having a lot of pain.  She is doing PT.     She is on Sinemet 25/100 q 4 hours while awake (4 times a day).   She felt gait improved with sinemet but she still had a tremor.    She feels the xanax has helped the tremors.    Propranolol helped the tremor the most but caused hypotension so she stopped,   She never started the low dose metoprolol.      She is sleeping worse due to the pain.    Methocarbamol was not well tolerated recently.    In the past she could not tolerate Flexeril.    Tramadol was not well tolerated   She also has urinary frequency and constipation, likely related to her PD  Update 02/06/2019: She continues to have a severe tremor in her right hand but not in the left hand or head.  She feels gait is doing ok.   No recent falls.  She is on Sinemet 25-100 one po tid and alprazolam 0.5 mg qid.     She does not think Sinemet helped the tremor but gait may be slightly better.     She was hospitalized for a colitis she reports was due to constipation from Sinemet..   She is now also on dicyclomine but had the constipation before that.  She feels the tremor is actually worse 30-90 minutes after each Sinemet and she feels more  agitated during that time.    Her tremor had done better on propranolol LA 60 mg but her BP dropped and she felt lightheaded and needed to stop  She has needed bursa injections for hip pain and just had one yesterday.  She is sore and feels she is walking a little worse today.  Alprazolam has helped the anxiety a little bit.   However, she still feels agitated at time during the day.  She reports trying several other medications in the past that did not help the anxiety or were poorly tolerated.  Update 11/28/2017: Her right hand tremor has worsened.  She does not note any significant tremor on the left.  Her tremor will worsen if she is anxious.  She is on Xanax 0.5 mg tid and that helps the tremor some.    She is on Sinemet CR 25/100.   She can tolerate one pill a day but on 2 pills she had a headache.  On 1 pill, she does not note any benefit.  We discussed that that dose is extremely low and would be unlikely to make a difference.    She stumbled some with her gait but has not had falls.  She is concerned about interactions between Wellbutrin and Sinemet.  We discussed that they can be taken at the same time.     She has shoulder pain on her right.   She only got short term benefit from the subacromial bursa injection.    From 03/16/2017: Tremor:   She has a tremor that is worse in the right arm the left arm. She notes it is generally worse when she is aggravated or if she tries to hold still such as holding a cup or spoon to her mouth. She also notes it with writing. Alprazolam has helped the tremor some she takes a low dose 0.25 mg 2-3 times a day.   She first noted a right hand tremor in late 2015.    She notes the tremor in the right hand much more than the left.      Tremor is not present when asleep.    She has not noted the tremor in the head.   She has not had any difficulty with her gait or balance. She has had a couple falls but they occurred with tripping. There is no family history of  tremors.   Shoulder pain:   The right shoulder pain returned. It is worse when she holds her arm over her head or externally rotates.  Pain is best when she holds the arm into her chest. In the past, we did a subacromial bursa injection and that helped her for 2-3 months. She also received an injection by her orthopedic surgeon that also helped for 2-3 months.   NSAIDs have not helped.  Nodules:   She has a nodule in the right arm.   She has shown her PCP  Mood:  She has noted some crying spells but no laughing spells. The crying spells and sometimes spontaneous often she is sad at that time. The history of depression. She prefers not to be on an antidepressant at this time.   REVIEW OF SYSTEMS: Constitutional: No fevers, chills, sweats, or change in appetite Eyes: No visual changes, double vision, eye pain Ear, nose and throat: No hearing loss, ear pain, nasal congestion, sore throat Cardiovascular: No chest pain, palpitations.  Has had palpitations Respiratory: No shortness of breath at rest or with exertion.   No wheezes GastrointestinaI: No nausea, vomiting, diarrhea, abdominal pain, fecal incontinence Genitourinary: No dysuria, urinary retention or frequency.  No nocturia. Musculoskeletal: No neck pain, back pain.  She notes right shoulder pain Integumentary: No rash, pruritus, skin lesions Neurological: as above Psychiatric: Some crying spells..  Some anxiety Endocrine: No palpitations, diaphoresis, change in appetite, change in weigh or increased thirst Hematologic/Lymphatic: No anemia, purpura, petechiae. Allergic/Immunologic: No itchy/runny eyes, nasal congestion, recent allergic reactions, rashes  ALLERGIES: Allergies  Allergen Reactions  . Iodine Anaphylaxis    IV and topical forms.  [name] [Hyoscyamine Sulfate]     Vision problems/pt has glaucoma  . Salmon [Fish Allergy] Hives and Shortness Of Breath  . Shellfish Allergy Anaphylaxis  . Tramadol Swelling  . Remeron  [Mirtazapine] Other (See Comments)    Cause blurred vision and red eyes, pt has glaucoma  . Aspirin Other (See Comments)    Sever stomach pain due to ulcer scaring.  . Ciprofloxacin Diarrhea  . Codeine Nausea And Vomiting  . Darvocet [Propoxyphene N-Acetaminophen] Nausea And Vomiting  . Demerol  [Meperidine Hcl]   . Demerol [Meperidine] Nausea Only  . Dexilant [Dexlansoprazole] Swelling    Redness, swelling and peeling of both feet.  . Diphedryl [Diphenhydramine] Other (See Comments)    Increased pulse/small  amount ok  . Doxycycline Hyclate Other (See Comments)    GI intolerance.  Marland Kitchen Epinephrine Other (See Comments)    Breathing problems  . Erythromycin Other (See Comments)    GI intolerance.  [name] [Cyclobenzaprine] Other (See Comments)    Tingly/prickly sensation.  Marland Kitchen Keflex [Cephalexin] Hives  . Latex Other (See Comments)    Gloves ok.  Skin gets red from elastic in underwear and latex bandaides.  . Nitrofurantoin Diarrhea  . Other   . Prednisone Other (See Comments)    Headache  . Pylera [Bis Subcit-Metronid-Tetracyc] Swelling    Tongue swelling. Face tingling  . Sulfa Antibiotics Other (See Comments)    Increased pulse, fainting, diarrhea, thrush  . Xylocaine [Lidocaine Hcl]     With epinephrine, given by dentist.  Speeded up heart rate and she passed out (occured twice, at dentist)  . Zoloft [Sertraline Hcl] Swelling and Other (See Comments)    Migraine Swelling of tongue/lip (09/2012)  . Advil [Ibuprofen] Other (See Comments)    Motrin ok with a GI effect.  . Biaxin [Clarithromycin] Rash    Started after completing 10 day course of 2000 mg /day, Lips swelling    HOME MEDICATIONS:  Current Outpatient Medications:  .  acetaminophen (TYLENOL) 500 MG tablet, Take 500-1,000 mg by mouth 3 (three) times daily as needed (pain.). , Disp: , Rfl:  .  ALPRAZolam (XANAX) 0.5 MG tablet, TAKE BY MOUTH UP TO 3 TIMES DAILY. WARNING: BENZODIAZEPINES INCREASE THE RISK OF FALLS  AND NEUROCOGNITIVE DECLINE IN THE ELDERLY (Patient taking differently: Take 0.5-1 mg by mouth See admin instructions. Take 1 tablet (0.5 mg) by mouth twice daily & take 2 tablets(1 mg) by mouth in the evening.), Disp: 90 tablet, Rfl: 2 .  calcium carbonate (OSCAL) 1500 (600 Ca) MG TABS tablet, Take 600 mg by mouth 2 (two) times daily., Disp: , Rfl:  .  Carbidopa-Levodopa ER (SINEMET CR) 25-100 MG tablet controlled release, Take 1 tablet by mouth 4 (four) times daily., Disp: 360 tablet, Rfl: 3 .  Cholecalciferol (VITAMIN D) 2000 UNITS tablet, Take 2,000 Units by mouth daily., Disp: , Rfl:  .  Probiotic Product (ALIGN PO), Take 1 capsule by mouth daily. , Disp: , Rfl:  .  SYNTHROID 25 MCG tablet, TAKE 1 TABLET DAILY BEFORE BREAKFAST, Disp: 90 tablet, Rfl: 1 .  carisoprodol (SOMA) 350 MG tablet, 1/2 to 1 po qHS, Disp: 30 tablet, Rfl: 0 .  omeprazole (PRILOSEC) 20 MG capsule, Take 1 capsule (20 mg total) by mouth 2 (two) times daily before a meal. (Patient not taking: Reported on 08/12/2019), Disp: 60 capsule, Rfl: 3  PAST MEDICAL HISTORY: Past Medical History:  Diagnosis Date  . Bell's palsy 1966   Hx: right side facial droop, resolved per patient 04/02/19  . Carotid artery disease (HCC) 2010   on vascular screening;unchanged 2013.(could not tolerate simvastatin, no other statins tried)--<30% blockage bilat 07/2011  . Chronic abdominal pain   . Chronic fatigue and malaise   . Claustrophobia   . Depression    treated in the past for years;stopped in 2010 for a years  . Duodenal ulcer 1962   h/o  . Dysrhythmia    ocassional PVC's, no current problems per patient on 04/02/19  . Fibromyalgia   . Frequent PVCs 07/2012   Seen by [facility]: benign, asymptomatic, normal EF  . GERD (gastroesophageal reflux disease)    diet controlled  . Glaucoma, narrow-angle    s/p laser surgery  . History  of hiatal hernia    during endoscopy  . Hypothyroid 9/08  . IBS (irritable bowel syndrome)    Dr. Elnoria Howard  .  Ischemic colitis (HCC) 11/21/2018   no current problems per patient on 04/02/19  . Ocular migraine   . Osteoporosis 10/11   Dr.Hawkes  . Panic attack   . Parkinson disease (HCC)   . Recurrent UTI    has cystocele-Dr.Grewal  . Shingles 1999   h/o  . Superficial thrombophlebitis 03/2009   RLE  . Trochanteric bursitis 12/2008   bilateral    PAST SURGICAL HISTORY: Past Surgical History:  Procedure Laterality Date  . ABDOMINAL HYSTERECTOMY    . CATARACT EXTRACTION, BILATERAL  1995, 1996  . EYE SURGERY Bilateral    laser - glaucoma  . Flexible sigmoidoscopy    . KYPHOPLASTY N/A 04/03/2019   Procedure: KYPHOPLASTY L1;  Surgeon: Venita Lick, MD;  Location: [facility] OR;  Service: Orthopedics;  Laterality: N/A;  90 mins  . THYROIDECTOMY, PARTIAL  09/2005   L nodule; Dr. Gerrit Friends  . TONSILLECTOMY  1946  . UPPER GI ENDOSCOPY  06/27/12  . VAGINAL HYSTERECTOMY  1971   and bladder repair.  Still has ovaries  . WISDOM TOOTH EXTRACTION      FAMILY HISTORY: Family History  Problem Relation Age of Onset  . Heart disease Mother   . Hypertension Mother   . Hypertension Sister   . HIV Son   . Heart disease Brother   . Lung cancer Brother        lung  . Diabetes Maternal Grandfather   . Diabetes Granddaughter        type 1    SOCIAL HISTORY:  Social History   Socioeconomic History  . Marital status: Married    Spouse name: Chrissie Noa  . Number of children: 2  . Years of education: Not on file  . Highest education level: Not on file  Occupational History  . Occupation: retired (school system)  Tobacco Use  . Smoking status: Never Smoker  . Smokeless tobacco: Never Used  Substance and Sexual Activity  . Alcohol use: No    Alcohol/week: 0.0 standard drinks  . Drug use: No  . Sexual activity: Not Currently    Partners: Male    Birth control/protection: Other-see comments    Comment: Hysterectomy  Other Topics Concern  . Not on file  Social History Narrative   Married.  Son lives  in [place]; Daughter [name] lives in [place]; 2 grandchildren   Social Determinants of Health   Financial Resource Strain:   . Difficulty of Paying Living Expenses: Not on file  Food Insecurity:   . Worried About Programme researcher, broadcasting/film/video in the Last Year: Not on file  . Ran Out of Food in the Last Year: Not on file  Transportation Needs:   . Lack of Transportation (Medical): Not on file  . Lack of Transportation (Non-Medical): Not on file  Physical Activity:   . Days of Exercise per Week: Not on file  . Minutes of Exercise per Session: Not on file  Stress:   . Feeling of Stress : Not on file  Social Connections:   . Frequency of Communication with Friends and Family: Not on file  . Frequency of Social Gatherings with Friends and Family: Not on file  . Attends Religious Services: Not on file  . Active Member of Clubs or Organizations: Not on file  . Attends Banker Meetings: Not on file  . Marital  Status: Not on file  Intimate Partner Violence:   . Fear of Current or Ex-Partner: Not on file  . Emotionally Abused: Not on file  . Physically Abused: Not on file  . Sexually Abused: Not on file     PHYSICAL EXAM  Vitals:   08/12/19 1537  BP: 129/61  Pulse: 74  Temp: (!) 97.3 F (36.3 C)  SpO2: 97%  Height: 4\' 11"  (1.499 m)    Body mass index is 20.6 kg/m.   General: The patient is well-developed and well-nourished and in no acute distress  Musculoskeletal:  There is tenderness over the right subacromial bursa and she has a reduced range of motion of the right shoulder, limited by pain.  Skin:   No edema.    There were a couple small subcutaneous nodules in the right arm. .    Neurologic Exam  Mental status: The patient is alert and oriented x 3 at the time of the examination. The patient has apparent normal recent and remote memory, with an apparently normal attention span and concentration ability.   Speech is normal.  Cranial nerves: Extraocular movements are  full.   Facial strength is normal.  Trapezius and sternocleidomastoid strength is normal. No dysarthria is noted.    Motor: She does not have bradykinesia.  There is a 6 Hz tremor on the right>> left  that is worse during rest and with intention.   Muscle bulk is normal.   Tone is normal. No cogwheeling.  Strength is  5 / 5 in all 4 extremities.   Sensory: Sensory testing is intact to touch and vibration sensation in all 4 extremities.  Coordination: Cerebellar testing reveals good finger-nose-finger bilaterally.  Gait and station: Station is normal.   The gait has a mildly reduced stride and normal arm swing.   She turns 180 degrees in 3-4 steps..  Romberg is negative.   Reflexes: Deep tendon reflexes are symmetric and normal bilaterally.    DIAGNOSTIC DATA (LABS, IMAGING, TESTING) - I reviewed patient records, labs, notes, testing and imaging myself where available.  Lab Results  Component Value Date   WBC 8.2 04/08/2019   HGB 14.8 04/08/2019   HCT 46.0 04/08/2019   MCV 91.8 04/08/2019   PLT 341 04/08/2019      Component Value Date/Time   NA 142 04/08/2019 0516   NA 139 12/02/2018 1454   K 3.7 04/08/2019 0516   CL 106 04/08/2019 0516   CO2 25 04/08/2019 0516   GLUCOSE 98 04/08/2019 0516   BUN 10 04/08/2019 0516   BUN 11 12/02/2018 1454   CREATININE 0.59 04/08/2019 0516   CREATININE 0.75 10/22/2018 1401   CREATININE 0.67 11/23/2016 0815   CALCIUM 9.5 04/08/2019 0516   PROT 7.2 04/08/2019 0516   PROT 6.7 03/06/2019 1503   ALBUMIN 4.0 04/08/2019 0516   ALBUMIN 4.2 03/06/2019 1503   AST 29 04/08/2019 0516   AST 17 10/22/2018 1401   ALT 22 04/08/2019 0516   ALT 7 10/22/2018 1401   ALKPHOS 111 04/08/2019 0516   BILITOT 0.7 04/08/2019 0516   BILITOT 0.7 03/06/2019 1503   BILITOT 0.8 10/22/2018 1401   GFRNONAA >60 04/08/2019 0516   GFRNONAA >60 10/22/2018 1401   GFRAA >60 04/08/2019 0516   GFRAA >60 10/22/2018 1401   Lab Results  Component Value Date   CHOL 233 (H)  08/29/2018   HDL 91 08/29/2018   LDLCALC 116 (H) 08/29/2018   TRIG 128 08/29/2018   CHOLHDL 2.6 08/29/2018  Lab Results  Component Value Date   HGBA1C 5.5 01/23/2018   No results found for: VITAMINB12 Lab Results  Component Value Date   TSH 1.650 08/29/2018       ASSESSMENT AND PLAN    1. Parkinson's disease (HCC)   2. Essential tremor   3. Compression fracture of L1 vertebra, sequela   4. Gait disturbance   5. Urinary frequency     1.    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   Continue sinemet.   May try low dose metoprolol but she prefers to wait till back is better. 2.    Dr. Gaspar Skeeters has her on Xanax which also helps tremor 3.    Soma 175 mg for lower back spasticity and pain at night.   4.   She will return to see me in 6 months or as needed if there are new or worsening neurologic symptoms.   Blanche Scovell A. Epimenio Foot, MD, PhD 08/12/2019, 9:05 PM Certified in Neurology, Clinical Neurophysiology, Sleep Medicine, Pain Medicine and Neuroimaging  [facility] [address], [address] [place], [place] [address] [phone]

## 2019-08-13 ENCOUNTER — Telehealth: Payer: Self-pay | Admitting: Licensed Clinical Social Worker

## 2019-08-13 NOTE — Telephone Encounter (Signed)
Palliative Care SW returned phone call from patient.  She requested a home visit.  It is scheduled for 2pm tomorrow, 1/21.

## 2019-08-14 ENCOUNTER — Other Ambulatory Visit: Payer: Medicare Other | Admitting: Licensed Clinical Social Worker

## 2019-08-14 ENCOUNTER — Other Ambulatory Visit: Payer: Self-pay

## 2019-08-14 ENCOUNTER — Other Ambulatory Visit: Payer: Medicare Other | Admitting: *Deleted

## 2019-08-14 DIAGNOSIS — Z515 Encounter for palliative care: Secondary | ICD-10-CM

## 2019-08-15 DIAGNOSIS — M545 Low back pain: Secondary | ICD-10-CM | POA: Diagnosis not present

## 2019-08-15 DIAGNOSIS — S32000D Wedge compression fracture of unspecified lumbar vertebra, subsequent encounter for fracture with routine healing: Secondary | ICD-10-CM | POA: Diagnosis not present

## 2019-08-15 DIAGNOSIS — R262 Difficulty in walking, not elsewhere classified: Secondary | ICD-10-CM | POA: Diagnosis not present

## 2019-08-15 NOTE — Progress Notes (Signed)
COMMUNITY PALLIATIVE CARE SW NOTE  PATIENT NAME: Tracy Malone DOB: [birth year] MRN: [id number]  PRIMARY CARE PROVIDER: [name], MD  RESPONSIBLE PARTY:  Acct ID - Guarantor Home Phone Work Phone Relationship Acct Type  [id number] [name] Self P/F     [address], [place], [place] [address]     PLAN OF CARE and INTERVENTIONS:             1. GOALS OF CARE/ ADVANCE CARE PLANNING:  Goal is for patient to remain in her home with her husband, [name], who is also a Palliative Care patient.  She is a full code. 2. SOCIAL/EMOTIONAL/SPIRITUAL ASSESSMENT/ INTERVENTIONS:  SW and Palliative Care RN, [name], met with patient and her husband in their home.  She was eating lunch independently in her kitchen.  She continues to express concern regarding her physical limitations.  She has a conflicted discussion with her husband about his fall down their stairs a few weeks ago.  She is very frustrated with her isolation.  SW provided active listening and supportive counseling. 3. PATIENT/CAREGIVER EDUCATION/ COPING:  Patient copes by venting. 4. PERSONAL EMERGENCY PLAN:  She will contact her MD. 5. COMMUNITY RESOURCES COORDINATION/ HEALTH CARE NAVIGATION:  She assistance with housekeeping. 6. FINANCIAL/LEGAL CONCERNS/INTERVENTIONS:  None.     SOCIAL HX:  Social History   Tobacco Use  . Smoking status: Never Smoker  . Smokeless tobacco: Never Used  Substance Use Topics  . Alcohol use: No    Alcohol/week: 0.0 standard drinks    CODE STATUS:  Full Code  ADVANCED DIRECTIVES: N MOST FORM COMPLETE:  Introduced HOSPICE EDUCATION PROVIDED:  N PPS:  Her appetite is normal.  Patient ambulates independently. Duration of visit and documentation:  45 minutes.      Creola Corn Taquila Leys, LCSW

## 2019-08-18 DIAGNOSIS — R262 Difficulty in walking, not elsewhere classified: Secondary | ICD-10-CM | POA: Diagnosis not present

## 2019-08-18 DIAGNOSIS — S32000D Wedge compression fracture of unspecified lumbar vertebra, subsequent encounter for fracture with routine healing: Secondary | ICD-10-CM | POA: Diagnosis not present

## 2019-08-18 DIAGNOSIS — M545 Low back pain: Secondary | ICD-10-CM | POA: Diagnosis not present

## 2019-08-18 NOTE — Progress Notes (Signed)
COMMUNITY PALLIATIVE CARE RN NOTE  PATIENT NAME: Tracy Malone DOB: Apr 24, 1932 MRN: [id number]  PRIMARY CARE PROVIDER: [name], MD  RESPONSIBLE PARTY: [name] (husband) Acct ID - Guarantor Home Phone Work Phone Relationship Acct Type  [id number] TALEENA, [phone] Self P/F     [address], [place], [place] [address]   Covid-19 Pre-screening Negative  PLAN OF CARE and INTERVENTION:  1. ADVANCE CARE PLANNING/GOALS OF CARE: Goal is for patient's back pain/issues to improve. She is a Full Code. 2. PATIENT/CAREGIVER EDUCATION: Symptom Management 3. DISEASE STATUS: Joint visit made with Palliative care SW, Lynn Duffy. Upon arrival, patient is standing up in the kitchen and states that she wants to finish her sandwich. She reports that her back and leg pain has improved some, but is still present. She is experiencing soreness in the back part of her legs by her hamstrings, she states from doing exercises with PT recently, where she had to stand several times from a seated position without using her arms to push her upright. She also reports that most of her back pain is in the left side and hip region. She is participating in Outpatient PT 2x/week. She no longer wants to do water aerobics, as she feels this causes her more discomfort afterwards than regular PT. After her water aerobics session last month, she hit her toe on a water jet when exiting the pool. This is also causing foot pain and she noticed a bruise on her right 2nd toe this week. Her intake is variable. She alternates between constipation and loose stools. This week she reports more loose stools and feels this varies based on what she has eaten. She has good fluid intake. She is ambulatory without assistive devices. She finds it easier to balance herself by placing her hands on her hips when she walks. Her mood was pleasant today. No tearful episodes. She is no longer taking Prozac, as she felt this has  caused her to bruise too easily. She noticed bruising and a raised area on her right hand recently. Area is now flat, but bruising is still present. She continues on Xanax 2-3 times per day. She feels that she has been more tired lately and spending more time lying around. She is able to perform ADLs without assistance. She is no longer able to perform household chores such as vacuuming and cleaning. She says that she is no longer able to write letters to her son, as her writing is now illegible. This is frustrating to her. She types letters now using her tablet. Will continue to monitor.   HISTORY OF PRESENT ILLNESS:  His is a 86 yo female who resides at home with her husband. Palliative care continues to follow patient. Team to continue to visit monthly and PRN  CODE STATUS: Full Code ADVANCED DIRECTIVES: Y MOST FORM: MOST form left in the home for patient to review PPS: 50%   PHYSICAL EXAM:    LUNGS: clear to auscultation  CARDIAC: Cor RRR EXTREMITIES: No edema SKIN: Exposed skin is dry and intact; bruising noted to right 2nd toe and right hand  NEURO: Alert and oriented x 3, pleasant mood, generalized weakness, ambulatory   (Duration of visit and documentation 60 minutes)    [name], RN BSN

## 2019-08-22 DIAGNOSIS — R262 Difficulty in walking, not elsewhere classified: Secondary | ICD-10-CM | POA: Diagnosis not present

## 2019-08-22 DIAGNOSIS — M545 Low back pain: Secondary | ICD-10-CM | POA: Diagnosis not present

## 2019-08-22 DIAGNOSIS — S32000D Wedge compression fracture of unspecified lumbar vertebra, subsequent encounter for fracture with routine healing: Secondary | ICD-10-CM | POA: Diagnosis not present

## 2019-08-25 ENCOUNTER — Encounter: Payer: Self-pay | Admitting: Family Medicine

## 2019-08-25 DIAGNOSIS — R262 Difficulty in walking, not elsewhere classified: Secondary | ICD-10-CM | POA: Diagnosis not present

## 2019-08-25 DIAGNOSIS — S32000D Wedge compression fracture of unspecified lumbar vertebra, subsequent encounter for fracture with routine healing: Secondary | ICD-10-CM | POA: Diagnosis not present

## 2019-08-25 DIAGNOSIS — M545 Low back pain: Secondary | ICD-10-CM | POA: Diagnosis not present

## 2019-08-26 ENCOUNTER — Encounter: Payer: Self-pay | Admitting: Family Medicine

## 2019-09-01 DIAGNOSIS — R262 Difficulty in walking, not elsewhere classified: Secondary | ICD-10-CM | POA: Diagnosis not present

## 2019-09-01 DIAGNOSIS — S32000D Wedge compression fracture of unspecified lumbar vertebra, subsequent encounter for fracture with routine healing: Secondary | ICD-10-CM | POA: Diagnosis not present

## 2019-09-01 DIAGNOSIS — M545 Low back pain: Secondary | ICD-10-CM | POA: Diagnosis not present

## 2019-09-02 ENCOUNTER — Telehealth: Payer: Self-pay | Admitting: *Deleted

## 2019-09-02 NOTE — Telephone Encounter (Signed)
Contacted and spoke with patient to arrange a home visit. Visit scheduled for 09/09/19@ 11:00am.

## 2019-09-02 NOTE — Patient Instructions (Addendum)
HEALTH MAINTENANCE RECOMMENDATIONS:  It is recommended that you get at least 30 minutes of aerobic exercise at least 5 days/week (for weight loss, you may need as much as 60-90 minutes). This can be any activity that gets your heart rate up. This can be divided in 10-15 minute intervals if needed, but try and build up your endurance at least once a week.  Weight bearing exercise is also recommended twice weekly.  Eat a healthy diet with lots of vegetables, fruits and fiber.  "Colorful" foods have a lot of vitamins (ie green vegetables, tomatoes, red peppers, etc).  Limit sweet tea, regular sodas and alcoholic beverages, all of which has a lot of calories and sugar.  Up to 1 alcoholic drink daily may be beneficial for women (unless trying to lose weight, watch sugars).  Drink a lot of water.  Calcium recommendations are 1200-1500 mg daily (1500 mg for postmenopausal women or women without ovaries), and vitamin D 1000 IU daily.  This should be obtained from diet and/or supplements (vitamins), and calcium should not be taken all at once, but in divided doses.  Monthly self breast exams and yearly mammograms for women over the age of 75 is recommended.  Sunscreen of at least SPF 30 should be used on all sun-exposed parts of the skin when outside between the hours of 10 am and 4 pm (not just when at beach or pool, but even with exercise, golf, tennis, and yard work!)  Use a sunscreen that says "broad spectrum" so it covers both UVA and UVB rays, and make sure to reapply every 1-2 hours.  Remember to change the batteries in your smoke detectors when changing your clock times in the spring and fall. Carbon monoxide detectors are recommended for your home.  Use your seat belt every time you are in a car, and please drive safely and not be distracted with cell phones and texting while driving.   Tracy Malone , Thank you for taking time to come for your Medicare Wellness Visit. I appreciate your ongoing  commitment to your health goals. Please review the following plan we discussed and let me know if I can assist you in the future.   This is a list of the screening recommended for you and due dates:  Health Maintenance  Topic Date Due  . Tetanus Vaccine  09/24/2019  . Flu Shot  Completed  . DEXA scan (bone density measurement)  Completed  . Pneumonia vaccines  Completed   Tetanus booster is due next month (to get at the pharmacy)--you need to wait at least 2 weeks after completing your COVID-19 vaccines.   After the COVID series, at some point, I also recommend you get Shingrix from the pharmacy (they usually want you to wait 4 weeks after any other vaccine). You will need to get this from the pharmacy, as it is covered by Medicare Part D (just like the tetanus).  It is a series of 2 injections, spaced 2 months apart. If you do not have Part D, then the least expensive place to pay out of pocket for the vaccine would be at [place].  I recommend getting the new shingles vaccine (Shingrix). You will need to get this from the pharmacy rather than our office, as it is covered by Medicare Part D.  It is a series of 2 injections, spaced 2 months apart. This needs to come 4 weeks after other vaccines (check with pharmacy to see if they will give the same  day as the tetanus.   Osteoporosis--it is very important that you start treatment, to prevent further compression fractures in your spine.  I understand you wanting to have another bone density test soon, as a baseline.  I understand you would prefer to get this from Dr. Christen Butter office.  The last bone density I see from rheum was either 2015 or 2016. If you truly had another one there since then (that they didn't send [place]), then you need to make sure that it has been 2 years since the last one, or insurance won't pay for it.  Technically, you do not need to wait for this scan to start treatment. If it has been more than 2 years, then it is fine to  scan then treat.  Prolia is what has been recommended, and I agree with this.  Remember it needs to be used long-term.  If Dr. Helane Rima doesn't do Prolia treatment at her office, make sure we get the bone density test results (most recent) and we can do here.  I recommend taking the fiber supplement once daily.  Use heat to the tender areas on your left chest   Be sure to eat frequently, especially if you're only eating small amounts at a time (every 1-2 hours).  Please let us know the date of your first COVID vaccine (send [place] a MyChart message).   Osteoporosis  Osteoporosis is thinning and loss of density in your bones. Osteoporosis makes bones more brittle and fragile and more likely to break (fracture). Over time, osteoporosis can cause your bones to become so weak that they fracture after a minor fall. Bones in the hip, wrist, and spine are most likely to fracture due to osteoporosis. What are the causes? The exact cause of this condition is not known. What increases the risk? You may be at greater risk for osteoporosis if you:  Have a family history of the condition.  Have poor nutrition.  Use steroid medicines, such as prednisone.  Are female.  Are age 87 or older.  Smoke or have a history of smoking.  Are not physically active (are sedentary).  Are white (Caucasian) or of Asian descent.  Have a small body frame.  Take certain medicines, such as antiseizure medicines. What are the signs or symptoms? A fracture might be the first sign of osteoporosis, especially if the fracture results from a fall or injury that usually would not cause a bone to break. Other signs and symptoms include:  Pain in the neck or low back.  Stooped posture.  Loss of height. How is this diagnosed? This condition may be diagnosed based on:  Your medical history.  A physical exam.  A bone mineral density test, also called a DXA or DEXA test (dual-energy X-ray absorptiometry test).  This test uses X-rays to measure the amount of minerals in your bones. How is this treated? The goal of treatment is to strengthen your bones and lower your risk for a fracture. Treatment may involve:  Making lifestyle changes, such as: ? Including foods with more calcium and vitamin D in your diet. ? Doing weight-bearing and muscle-strengthening exercises. ? Stopping tobacco use. ? Limiting alcohol intake.  Taking medicine to slow the process of bone loss or to increase bone density.  Taking daily supplements of calcium and vitamin D.  Taking hormone replacement medicines, such as estrogen for women and testosterone for men.  Monitoring your levels of calcium and vitamin D. Follow these instructions at home:  Activity  Exercise as told by your health care provider. Ask your health care provider what exercises and activities are safe for you. You should do: ? Exercises that make you work against gravity (weight-bearing exercises), such as tai chi, yoga, or walking. ? Exercises to strengthen muscles, such as lifting weights. Lifestyle  Limit alcohol intake to no more than 1 drink a day for nonpregnant women and 2 drinks a day for men. One drink equals 12 oz of beer, 5 oz of wine, or 1 oz of hard liquor.  Do not use any products that contain nicotine or tobacco, such as cigarettes and e-cigarettes. If you need help quitting, ask your health care provider. Preventing falls  Use devices to help you move around (mobility aids) as needed, such as canes, walkers, scooters, or crutches.  Keep rooms well-lit and clutter-free.  Remove tripping hazards from walkways, including cords and throw rugs.  Install grab bars in bathrooms and safety rails on stairs.  Use rubber mats in the bathroom and other areas that are often wet or slippery.  Wear closed-toe shoes that fit well and support your feet. Wear shoes that have rubber soles or low heels.  Review your medicines with your health  care provider. Some medicines can cause dizziness or changes in blood pressure, which can increase your risk of falling. General instructions  Include calcium and vitamin D in your diet. Calcium is important for bone health, and vitamin D helps your body to absorb calcium. Good sources of calcium and vitamin D include: ? Certain fatty fish, such as salmon and tuna. ? Products that have calcium and vitamin D added to them (fortified products), such as fortified cereals. ? Egg yolks. ? Cheese. ? Liver.  Take over-the-counter and prescription medicines only as told by your health care provider.  Keep all follow-up visits as told by your health care provider. This is important. Contact a health care provider if:  You have never been screened for osteoporosis and you are: ? A woman who is age 65 or older. ? A man who is age 75 or older. Get help right away if:  You fall or injure yourself. Summary  Osteoporosis is thinning and loss of density in your bones. This makes bones more brittle and fragile and more likely to break (fracture),even with minor falls.  The goal of treatment is to strengthen your bones and reduce your risk for a fracture.  Include calcium and vitamin D in your diet. Calcium is important for bone health, and vitamin D helps your body to absorb calcium.  Talk with your health care provider about screening for osteoporosis if you are a woman who is age 9 or older, or a man who is age 36 or older. This information is not intended to replace advice given to you by your health care provider. Make sure you discuss any questions you have with your health care provider. Document Revised: 06/22/2017 Document Reviewed: 05/04/2017 [employer] Patient Education  2020 [employer].

## 2019-09-02 NOTE — Progress Notes (Signed)
Chief Complaint  Patient presents with  . Medicare Wellness    fasting AWV/med check plus. Would really like you to check her back-she is still having pain. She has no appetite and is very tired. Has periodic shallow breathing when she is laying down.     Tracy Malone is a 86 y.o. female who presents for annual wellness visit and follow-up on chronic medical conditions.  She is accompanied by her husband. She has the following concerns: Ongoing back pain, decreased appetite and fatigue.  She is followed by palliative care, with home visits. "they are real nice".  UTI treated by GYN in late December.  Mainly had low back pain, and some abdominal pain, but was also having GI issues at that time. Currently she denies any urinary complaints.  Bowels are better, just occasional bloating and cramping.  Bentyl helps, and uses it prn with good results.  She has chronic constipation and frequent bloating, and has been under the care of GI for these complaints.  She used fiber supplement in the past, stopped because she  "no longer needs it". She continues to only be able to eat small amounts. She also has h/o ischemic colitis (10/2018)  Hypothyroidism:She reports compliance with taking her Synthroid 23mg daily, on an empty stomach, separate from her other medications.  She denies any significant changes to hair/skin/energy. She has chronic dryness to her skin, mild hair loss, fatigue and depression, no recent changes. No changes in the last 6 months. Bowels as regular, no recent diarrhea or constipation. +weight loss    Lab Results  Component Value Date   TSH 1.650 08/29/2018   Osteoporosis, with L1 compression fracture, s/p kyphoplasty.  Continues to have some pain. She is getting PT, but still has pain, which concerns her. She had known osteoporosis prior to this fracture (DEXA done in 2016 by Dr. HTrudie Reed.  Prolia was recommended at that time, declined by patient. Patient thinks she had  another DEXA after that, but we do not have results. She saw Dr. KBuddy Dutylast month to discuss osteoporosis treatments, and declined all treatments.  She reports today that she wants to have another bone density test, through Dr. GHelane Rima but hasn't been able to lay on her back to have it done, due to pain.  Mild carotid artery disease--intolerant of a statin in the past, and had been unwilling to try additional statins. Recheck of u/s in 2013 was stable, minimal disease (282-99%stenosis of LICA, <<37%stenosis of RICA). Denies neuro symptoms--no headaches, dizziness, numbness (only in right 4th and 5th fingertips from a mandolin injury), tingling, changes inspeech, or otherconcerns. (just tremor related to PD).  Depression:  Under the care of Dr. EDaron Offer  She was started on 172mProzac about 2 months ago.  She noticed some improvement initially only. Has f/u appt next week. She has been very depressed.  She continues to get counseling.  Stressors include being worried about her son ChGerald Stabsho has been in legal trouble. Sentence has been reduced by 2 years. This is still an issue and is discussing in therapy.  Parkinson's and Essential Tremor (Dr. SaFelecia Shellingeels she likely has overlap)--last saw Dr. SaFelecia Shelling/2021.  She continues on Sinimet.  Low dose metoprolol could be tried, previously didn't tolerate propranolol due to low BP.  Discussed with pt at that visit and preferred to wait until her back felt better.  She notes that the xanax prescribed by her psychiatrist does help with her tremor.  Dr.  Sater prescribed Soma for low back spasticity and pain at night.  It didn't help her sleep, couldn't tell if it helped with her pain. Her husband admits that she had tried 1/2 tablet of his Manuela Neptune previously, and it did help some. (didn't tolerate methocarbamol, nor flexeril in the past).  She last saw Dr. Helane Rima in 11/2018 She sees Dr. Renaldo Reel for VV.  Her legs haven't been bothering her, so hasn't been wearing  compression stockings recently.    Immunization History  Administered Date(s) Administered  . Fluad Quad(high Dose 65+) 05/15/2019  . Influenza Split 06/22/2015  . Influenza, High Dose Seasonal PF 06/13/2013, 04/28/2014, 05/12/2017, 04/17/2018  . Influenza-Unspecified 05/17/2016  . Pneumococcal Conjugate-13 09/29/2013  . Pneumococcal Polysaccharide-23 08/29/2018  . Tdap 09/23/2009   Got 1st COVID vaccine, pfizer, thinks 08/28/2019. Last Pap smear: 03/2015 with Dr. Helane Rima; last seen by GYN 11/2018 Last mammogram:06/2019 Last colonoscopy: 2006; negative Cologard 04/2015 and 05/2018 Last DEXA: 08/2014 with Dr. Posey Rea.  Osteoporosis.  Prolia was recommended at that time (not started). She thinks she has had DEXA since then. Carotid u/s 07/2011 (no significant stenosis, stable/unchanged, 20-30% at [facility]) Dentist: twice yearly Ophtho: yearly Exercise:limited by back pain; doing PT (pool exercises, and outside of pool).  She is not to use her exercise bike yet, or do weight-bearing exercise Vitamin D-OH 04/2012 level of 38 Lipids: Lab Results  Component Value Date   CHOL 233 (H) 08/29/2018   HDL 91 08/29/2018   LDLCALC 116 (H) 08/29/2018   TRIG 128 08/29/2018   CHOLHDL 2.6 08/29/2018   Other doctors caring for this patient include:  GYN: Dr. Helane Rima Rheum: Dr.Syed (previously Dr. Trudie Reed) Ophtho: Dr. Tora Kindred Dentist: Dr. Sandria Bales GI: Dr.Jacobs Cardiology: Dr. Johnsie Cancel Derm: Dr. Derrel Nip Urologist: Dr. Silverio Lay plan to see him again) Psychiatrist:Dr. Eskir Psychologist:  Dr. Ouida Sills Podiatry: Dr. Elisha Ponder Ortho: Dr. Eugenie Filler (or ankle), Dr. Rolena Infante (back) Neuro: [name] (also previously saw Dr.Tat, no longer sees her) Veins: Dr. Renaldo Reel Endocrine: Dr. Buddy Duty  End of Life: She has a healthcare power of attorney and living will.  Depression screening:PHQ-2 score of 6, PHQ-9 score of 17; she is under care of psychiatrist and therapist. Fall  screen:negative Functional Status survey:  no longer drives, decreased vision L eye, husband helps with dressing/bathing, bowel issues from IBS, memory.  See full screen in epic Mini-Cog screen: normal (perfect circle, no evidence of tremor) See epic for full screens.   PMH, PSH, SH and FH reviewed  Outpatient Encounter Medications as of 09/03/2019  Medication Sig Note  . acetaminophen (TYLENOL) 500 MG tablet Take 500-1,000 mg by mouth 3 (three) times daily as needed (pain.).  09/03/2019: As needed  . ALPRAZolam (XANAX) 0.5 MG tablet TAKE BY MOUTH UP TO 3 TIMES DAILY. WARNING: BENZODIAZEPINES INCREASE THE RISK OF FALLS AND NEUROCOGNITIVE DECLINE IN THE ELDERLY (Patient taking differently: Take 0.5-1 mg by mouth See admin instructions. Take 1 tablet (0.5 mg) by mouth twice daily & take 2 tablets(1 mg) by mouth in the evening.)   . b complex vitamins tablet Take 1 tablet by mouth daily.   . calcium carbonate (OSCAL) 1500 (600 Ca) MG TABS tablet Take 600 mg by mouth 2 (two) times daily.   . Carbidopa-Levodopa ER (SINEMET CR) 25-100 MG tablet controlled release Take 1 tablet by mouth 4 (four) times daily.   . Cholecalciferol (VITAMIN D) 2000 UNITS tablet Take 2,000 Units by mouth daily.   Marland Kitchen dicyclomine (BENTYL) 10 MG capsule dicyclomine 10 mg  capsule   10 mg by oral route.   Marland Kitchen FLUoxetine (PROZAC) 10 MG capsule Take 10 mg by mouth daily.   Marland Kitchen omeprazole (PRILOSEC) 20 MG capsule Take 1 capsule (20 mg total) by mouth 2 (two) times daily before a meal. 09/03/2019: Once daily, as needed.  . Probiotic Product (ALIGN PO) Take 1 capsule by mouth daily.    Marland Kitchen SYNTHROID 25 MCG tablet TAKE 1 TABLET DAILY BEFORE BREAKFAST   . [DISCONTINUED] carisoprodol (SOMA) 350 MG tablet 1/2 to 1 po qHS    No facility-administered encounter medications on file as of 09/03/2019.   Allergies  Allergen Reactions  . Iodine Anaphylaxis    IV and topical forms.  Burnard Leigh [Hyoscyamine Sulfate]     Vision problems/pt has  glaucoma  . Salmon [Fish Allergy] Hives and Shortness Of Breath  . Shellfish Allergy Anaphylaxis  . Tramadol Swelling  . Remeron [Mirtazapine] Other (See Comments)    Cause blurred vision and red eyes, pt has glaucoma  . Aspirin Other (See Comments)    Sever stomach pain due to ulcer scaring.  . Ciprofloxacin Diarrhea  . Codeine Nausea And Vomiting  . Darvocet [Propoxyphene N-Acetaminophen] Nausea And Vomiting  . Demerol  [Meperidine Hcl]   . Demerol [Meperidine] Nausea Only  . Dexilant [Dexlansoprazole] Swelling    Redness, swelling and peeling of both feet.  . Diphedryl [Diphenhydramine] Other (See Comments)    Increased pulse/small amount ok  . Doxycycline Hyclate Other (See Comments)    GI intolerance.  Marland Kitchen Epinephrine Other (See Comments)    Breathing problems  . Erythromycin Other (See Comments)    GI intolerance.  Yvette Rack [Cyclobenzaprine] Other (See Comments)    Tingly/prickly sensation.  Marland Kitchen Keflex [Cephalexin] Hives  . Latex Other (See Comments)    Gloves ok.  Skin gets red from elastic in underwear and latex bandaides.  . Nitrofurantoin Diarrhea  . Other   . Prednisone Other (See Comments)    Headache  . Pylera [Bis Subcit-Metronid-Tetracyc] Swelling    Tongue swelling. Face tingling  . Sulfa Antibiotics Other (See Comments)    Increased pulse, fainting, diarrhea, thrush  . Xylocaine [Lidocaine Hcl]     With epinephrine, given by dentist.  Speeded up heart rate and she passed out (occured twice, at dentist)  . Zoloft [Sertraline Hcl] Swelling and Other (See Comments)    Migraine Swelling of tongue/lip (09/2012)  . Advil [Ibuprofen] Other (See Comments)    Motrin ok with a GI effect.  . Biaxin [Clarithromycin] Rash    Started after completing 10 day course of 2000 mg /day, Lips swelling    ROS: The patient denies fever, headaches, decreased hearing, breast concerns, chest pain (some L-sided chest wall pain, no exertional pain), dizziness, syncope, cough,  swelling, nausea, vomiting, diarrhea, constipation, abdominal pain, melena, hematochezia, hematuria, incontinence, dysuria, vaginal bleeding, discharge, odor or itch, genital lesions, numbness (just fingertips due to injury, 4th and 5th on R), tingling, weakness, suspicious skin lesions, abnormal bleeding, or enlarged lymph nodes.Some easy bruising. Low back pain since L1 fracture, s/p kyphoplasty with residual pain Depression--continues to be active, feeling isolated with COVID, worried about son.. Constipation and bloating--eating small meals helps some.  Constipation has resolved. L eye--crusted in the mornings/watery, some vision change.  Sees ophtho, needs new glasses Tremor R>L +insomnia--sleeping pretty well these days, except related to back pain. Some knee pain, chronic and mild.   PHYSICAL EXAM:  BP 120/62   Pulse 72   Temp (!) 96.8 F (  36 C) (Other (Comment))   Ht '4\' 10"'$  (1.473 m)   Wt 100 lb 3.2 oz (45.5 kg)   LMP  (LMP Unknown)   BMI 20.94 kg/m   Wt Readings from Last 3 Encounters:  09/03/19 100 lb 3.2 oz (45.5 kg)  07/28/19 102 lb (46.3 kg)  06/30/19 103 lb 6 oz (46.9 kg)   Wt 108# 12.8 oz at her CPE last year  General Appearance:  Alert, cooperative,thin elderly female in no distress.  Head:  Normocephalic, without obvious abnormality, atraumatic  Eyes:  PERRL, conjunctiva/corneas clear, EOM's intact  Ears:  Normal TM's and external ear canals  Nose: Not examined, wearing mask due to COVID-19 pandemic  Throat: Not examined, wearing mask due to COVID-19 pandemic  Neck: Supple, no lymphadenopathy; thyroid: no enlargement/ tenderness/nodules; no carotid bruit or JVD.   Back:  Spine nontender, no curvature, no CVA tenderness. No trigger points in upper back. Tender at left paraspinous muscles in lower back.  Lungs:  Clear to auscultation bilaterally without wheezes, rales or ronchi; respirations unlabored  Chest Wall:  No deformity. Tender  at 2-3 levels at CC junction on the left  Heart:  Regular rate and rhythm, S1 and S2 normal, no murmur, rub or gallop  Breast Exam:  Deferred to GYN  Abdomen:  Soft, nontender, nondistended, normoactive bowel sounds, no masses, no hepatosplenomegaly.  Genitalia:  Deferred to GYN     Extremities: No clubbing, cyanosis or edema  Pulses: 2+ and symmetric all extremities  Skin: Skin color, texture, turgor normal. No rashes/lesions.   Lymph nodes: Cervical, supraclavicular, and axillary nodes normal  Neurologic: Slow, but normal gait; reflexes 2+ and symmetric throughout. Resting right hand tremor noted.  Normal handwriting (and no evidence of tremor in drawing spiral, circle for her clock)  Psych:Depressed and anxiousmood, somewhat flattened affect,normalhygiene and grooming.Normal eye contact and speech   ASSESSMENT/PLAN:  Medicare annual wellness visit, subsequent  Severe recurrent major depression without psychotic features ([place]) - pt to continue to work closely and f/u with psych.  Denies SI  Hypothyroidism, unspecified type - due for recheck - Plan: TSH  Parkinson disease ([place]) - on treatment per [name]  Chronic constipation - improved; encouraged daily fiber supplement  Aortic atherosclerosis ([place]) - she has refused statins   Osteoporosis of lumbar spine - known osteoporosis for years, declined tx, now with painful compression fx. Encouraged Prolia tx now. She would like DEXA repeated thru her GYN. - Plan: VITAMIN D 25 Hydroxy (Vit-D Deficiency, Fractures)  Leukocytosis, unspecified type - resolved on last check.  Due for repeat  Weight loss - inadequate caloric intake, can tolerate small meals only. Reviewed diet, encouraged eating every 1-2 hours - Plan: TSH  Medication monitoring encounter - Plan: TSH, CBC with Differential/Platelet, Comprehensive metabolic panel  History of vertebral compression fracture - Plan:  VITAMIN D 25 Hydroxy (Vit-D Deficiency, Fractures)  Patient was extremely anxious and hesitant to start any treatment. Counseled extensively regarding her osteoporosis, recent compression fracture, and risk for additional fractures.  Reviewed risks/SE of Prolia, and she is willing to try.  Will send to [facility] to check on coverage/cost. Unsure if she has had DEXA in the last 2 years.  Pt wants a baseline prior to treatment--and prefers to get through Dr. Christen Butter office.  She will call and try and get it scheduled. Advised that if she has had one within 2 years, I would NOT delay starting Prolia treatments until her next scan, should start it right away (  but can delay for a short bit if able to get DEXA soon). Counseled re: Ca, D and weight-bearing exercise (some of which has been limited since her back surgery).  Getting 2800 IU daily of D total  TSH, cbc, c-met, D  COVID vaccines recommended. TdaP due in March (to wait until after COVID vaccines). Shingrix recommended--she doesn't have Medicare Part D.   Discussed monthly self breast exams and yearly mammograms; at least 30 minutes of aerobic activity at least 5 days/week, weight-bearing exercise at least 2-3x/week; proper sunscreen use reviewed; healthy diet, including goals of calcium and vitamin D intake and alcohol recommendations (less than or equal to 1 drink/day) reviewed; regular seatbelt use; changing batteries in smoke detectors. Immunization recommendations discussed--COVID vaccine recommended.  After completing that, should get TdaP from pharmacy.  Shingrix also recommended, risks/SE reviewed.  Continue yearly high dose flu shots.  Colon cancer screening recommendations reviewed--UTD  MOST form reviewed, Full Code, Full Care  Total FTF visit time 55 mins, plus at least 10 mins documentation and chart review.  Medicare Attestation I have personally reviewed: The patient's medical and social history Their use of alcohol, tobacco  or illicit drugs Their current medications and supplements The patient's functional ability including ADLs,fall risks, home safety risks, cognitive, and hearing and visual impairment Diet and physical activities Evidence for depression or mood disorders  The patient's weight, height, BMI have been recorded in the chart.  I have made referrals, counseling, and provided education to the patient based on review of the above and I have provided the patient with a written personalized care plan for preventive services.

## 2019-09-03 ENCOUNTER — Telehealth: Payer: Self-pay | Admitting: Family Medicine

## 2019-09-03 ENCOUNTER — Encounter: Payer: Self-pay | Admitting: *Deleted

## 2019-09-03 ENCOUNTER — Other Ambulatory Visit: Payer: Self-pay

## 2019-09-03 ENCOUNTER — Encounter: Payer: Self-pay | Admitting: Family Medicine

## 2019-09-03 ENCOUNTER — Ambulatory Visit (INDEPENDENT_AMBULATORY_CARE_PROVIDER_SITE_OTHER): Payer: Medicare Other | Admitting: Family Medicine

## 2019-09-03 VITALS — BP 120/62 | HR 72 | Temp 96.8°F | Ht <= 58 in | Wt 100.2 lb

## 2019-09-03 DIAGNOSIS — F332 Major depressive disorder, recurrent severe without psychotic features: Secondary | ICD-10-CM

## 2019-09-03 DIAGNOSIS — I700 Atherosclerosis of aorta: Secondary | ICD-10-CM

## 2019-09-03 DIAGNOSIS — M810 Age-related osteoporosis without current pathological fracture: Secondary | ICD-10-CM

## 2019-09-03 DIAGNOSIS — D72829 Elevated white blood cell count, unspecified: Secondary | ICD-10-CM

## 2019-09-03 DIAGNOSIS — K5909 Other constipation: Secondary | ICD-10-CM

## 2019-09-03 DIAGNOSIS — Z8781 Personal history of (healed) traumatic fracture: Secondary | ICD-10-CM

## 2019-09-03 DIAGNOSIS — G20 Parkinson's disease: Secondary | ICD-10-CM

## 2019-09-03 DIAGNOSIS — Z5181 Encounter for therapeutic drug level monitoring: Secondary | ICD-10-CM

## 2019-09-03 DIAGNOSIS — R634 Abnormal weight loss: Secondary | ICD-10-CM

## 2019-09-03 DIAGNOSIS — Z0000 Encounter for general adult medical examination without abnormal findings: Secondary | ICD-10-CM

## 2019-09-03 DIAGNOSIS — E039 Hypothyroidism, unspecified: Secondary | ICD-10-CM | POA: Diagnosis not present

## 2019-09-03 NOTE — Telephone Encounter (Signed)
Pt called and wanted to let you know that she had her first COVID vaccine on 08/28/2019 and it was phizer and she is having her second one on 09/18/2019

## 2019-09-03 NOTE — Telephone Encounter (Signed)
Done

## 2019-09-03 NOTE — Telephone Encounter (Signed)
Please abstract her first COVID vaccine

## 2019-09-04 ENCOUNTER — Encounter: Payer: Self-pay | Admitting: Family Medicine

## 2019-09-04 LAB — COMPREHENSIVE METABOLIC PANEL
ALT: 4 IU/L (ref 0–32)
AST: 19 IU/L (ref 0–40)
Albumin/Globulin Ratio: 1.6 (ref 1.2–2.2)
Albumin: 4.2 g/dL (ref 3.6–4.6)
Alkaline Phosphatase: 83 IU/L (ref 39–117)
BUN/Creatinine Ratio: 21 (ref 12–28)
BUN: 14 mg/dL (ref 8–27)
Bilirubin Total: 1 mg/dL (ref 0.0–1.2)
CO2: 25 mmol/L (ref 20–29)
Calcium: 9.8 mg/dL (ref 8.7–10.3)
Chloride: 103 mmol/L (ref 96–106)
Creatinine, Ser: 0.68 mg/dL (ref 0.57–1.00)
GFR calc Af Amer: 92 mL/min/{1.73_m2} (ref >59)
GFR calc non Af Amer: 79 mL/min/{1.73_m2} (ref >59)
Globulin, Total: 2.6 g/dL (ref 1.5–4.5)
Glucose: 91 mg/dL (ref 65–99)
Potassium: 4.6 mmol/L (ref 3.5–5.2)
Sodium: 141 mmol/L (ref 134–144)
Total Protein: 6.8 g/dL (ref 6.0–8.5)

## 2019-09-04 LAB — CBC WITH DIFFERENTIAL/PLATELET
Basophils Absolute: 0.1 10*3/uL (ref 0.0–0.2)
Basos: 1 %
EOS (ABSOLUTE): 0.2 10*3/uL (ref 0.0–0.4)
Eos: 3 %
Hematocrit: 42.5 % (ref 34.0–46.6)
Hemoglobin: 14.4 g/dL (ref 11.1–15.9)
Immature Grans (Abs): 0 10*3/uL (ref 0.0–0.1)
Immature Granulocytes: 0 %
Lymphocytes Absolute: 1.8 10*3/uL (ref 0.7–3.1)
Lymphs: 27 %
MCH: 29.9 pg (ref 26.6–33.0)
MCHC: 33.9 g/dL (ref 31.5–35.7)
MCV: 88 fL (ref 79–97)
Monocytes Absolute: 0.7 10*3/uL (ref 0.1–0.9)
Monocytes: 10 %
Neutrophils Absolute: 4.1 10*3/uL (ref 1.4–7.0)
Neutrophils: 59 %
Platelets: 286 10*3/uL (ref 150–450)
RBC: 4.82 x10E6/uL (ref 3.77–5.28)
RDW: 12.5 % (ref 11.7–15.4)
WBC: 6.9 10*3/uL (ref 3.4–10.8)

## 2019-09-04 LAB — VITAMIN D 25 HYDROXY (VIT D DEFICIENCY, FRACTURES): Vit D, 25-Hydroxy: 50 ng/mL (ref 30.0–100.0)

## 2019-09-04 LAB — TSH: TSH: 1.97 u[IU]/mL (ref 0.450–4.500)

## 2019-09-05 DIAGNOSIS — R262 Difficulty in walking, not elsewhere classified: Secondary | ICD-10-CM | POA: Diagnosis not present

## 2019-09-05 DIAGNOSIS — S32000D Wedge compression fracture of unspecified lumbar vertebra, subsequent encounter for fracture with routine healing: Secondary | ICD-10-CM | POA: Diagnosis not present

## 2019-09-05 DIAGNOSIS — M545 Low back pain: Secondary | ICD-10-CM | POA: Diagnosis not present

## 2019-09-09 ENCOUNTER — Other Ambulatory Visit: Payer: Medicare Other | Admitting: *Deleted

## 2019-09-09 ENCOUNTER — Other Ambulatory Visit: Payer: Self-pay

## 2019-09-09 ENCOUNTER — Other Ambulatory Visit: Payer: Medicare Other | Admitting: Licensed Clinical Social Worker

## 2019-09-09 DIAGNOSIS — Z515 Encounter for palliative care: Secondary | ICD-10-CM

## 2019-09-10 ENCOUNTER — Other Ambulatory Visit: Payer: Self-pay
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
## 2019-09-10 NOTE — Progress Notes (Signed)
COMMUNITY PALLIATIVE CARE RN NOTE  PATIENT NAME: Tracy Malone DOB: 06/13/1932 MRN: [id number]  PRIMARY CARE PROVIDER: [name], MD  RESPONSIBLE PARTY: [name] (husband) Acct ID - Guarantor Home Phone Work Phone Relationship Acct Type  [id number] SAIYA, [phone] Self P/F     [address] [place], [place] [address]   Covid-19 Pre-screening Negative  PLAN OF CARE and INTERVENTION:  1. ADVANCE CARE PLANNING/GOALS OF CARE: Goal is for patient to remain at home with her husband and avoid hospitalizations. She is a Full code. 2. PATIENT/CAREGIVER EDUCATION: Symptom management, safe mobility 3. DISEASE STATUS: Joint visit made with Palliative care SW, Lynn Duffy. Met with patient and her husband, [name], who is also a Palliative patient, in their home. Upon my arrival, she is sitting up on the couch eating a peanut butter sandwich. She continues to report soreness in her back, mainly the left side and lower back. She also has some soreness in both arms. She is currently receiving outpatient PT, which contributes to some of her soreness, but she states it is improving. No crying spells or sadness noted during visit. She continues on Prozac, which she feels is helping with her mood. She also continues with Xanax for anxiety routinely. She is ambulatory without assistive devices and able to perform ADLs independently. She says that it helps for her to walk with both hands on her hips to balance herself. Right hand tremor slightly more prevalent today. She reports not longer being able to write letters to her son d/t her tremors, so she now types them. She says that she does not have much of an appetite. She does try to eat several small meals per day. She drinks Ensure daily for nutritional supplementation, but has to take Lactaid prior to drinking these. Her bowels have been moving more regularly. She does experience some cramping and bloating at times. She is very  particular about what she eats in order to try to prevent stomach issues. She is sleeping well during the night most of the time. She says she does not have much energy and mainly feels like lying around much of the day. She has had her first Covid-19 vaccination without any ill effects. Will continue to monitor.    HISTORY OF PRESENT ILLNESS: This is a 86 yo female with a diagnosis of Parkinson's. She lives at home with her husband. Palliative care continues to follow patient. Will continue to visit patient monthly and PRN.   CODE STATUS: Full code ADVANCED DIRECTIVES: Y MOST FORM: no PPS: 50%   PHYSICAL EXAM:   LUNGS: clear to auscultation  CARDIAC: Cor RRR EXTREMITIES: No edema SKIN: Exposed skin is dry and intact  NEURO: Alert and oriented x 3, right hand tremor, generalized weakness, ambulatory   (Duration of visit and documentation 60 minutes)   [name], RN BSN

## 2019-09-12 ENCOUNTER — Encounter: Payer: Self-pay | Admitting: Family Medicine

## 2019-09-12 DIAGNOSIS — R262 Difficulty in walking, not elsewhere classified: Secondary | ICD-10-CM | POA: Diagnosis not present

## 2019-09-12 DIAGNOSIS — S32000D Wedge compression fracture of unspecified lumbar vertebra, subsequent encounter for fracture with routine healing: Secondary | ICD-10-CM | POA: Diagnosis not present

## 2019-09-12 DIAGNOSIS — M545 Low back pain: Secondary | ICD-10-CM | POA: Diagnosis not present

## 2019-09-14 ENCOUNTER — Encounter: Payer: Self-pay | Admitting: Family Medicine

## 2019-09-15 ENCOUNTER — Ambulatory Visit (INDEPENDENT_AMBULATORY_CARE_PROVIDER_SITE_OTHER): Payer: Medicare Other | Admitting: Family Medicine

## 2019-09-15 ENCOUNTER — Encounter: Payer: Self-pay | Admitting: Family Medicine

## 2019-09-15 ENCOUNTER — Other Ambulatory Visit: Payer: Self-pay

## 2019-09-15 VITALS — BP 140/60 | HR 68 | Temp 96.6°F | Ht <= 58 in | Wt 101.0 lb

## 2019-09-15 DIAGNOSIS — M7062 Trochanteric bursitis, left hip: Secondary | ICD-10-CM

## 2019-09-15 DIAGNOSIS — R0789 Other chest pain: Secondary | ICD-10-CM

## 2019-09-15 DIAGNOSIS — M810 Age-related osteoporosis without current pathological fracture: Secondary | ICD-10-CM

## 2019-09-15 DIAGNOSIS — R1084 Generalized abdominal pain: Secondary | ICD-10-CM

## 2019-09-15 NOTE — Progress Notes (Signed)
Chief Complaint  Patient presents with  . Back Pain    that radiates around to the left front (rib area) under her left breast.    Having pain at LLQ, started a few days after visit with Dr. Rolena Infante.  Feels swollen, goes into spasms, and spreads to the central portion of the stomach.  She felt some lumps in her stomach (no longer present).  She informs me that she had 6 bowel movements after he recent wellness visit (1 normal, 2 loose, 2 diarrhea).  Thinks related to Boost she had when she got home.  Had cramps in her "lower colon" this morning, extending to suprapubic area, took anti-spasmodic (bentyl), and pain resolved. Still has some discomfort in LLQ, but suprapubic cramps resolved.  Last BM was this morning--doesn't think she emptied completely. She does report she "felt blocked" this morning, prior to defecating.  Went twice.  Somewhat loose.  She has been taking Benefiber daily--drinks it throughout the day, admits she has had this just for the last few days.  She has questions about calcium and magnesium supplementation (see her MyChart messages).  She reports she is currentling taking calcium 600mg  BID.  They are asking for handicap placard, since it has been so hard for her to walk related to her back pain.  She is using a cane.   PMH, PSH, SH reviewed  Outpatient Encounter Medications as of 09/15/2019  Medication Sig Note  . acetaminophen (TYLENOL) 500 MG tablet Take 500-1,000 mg by mouth 3 (three) times daily as needed (pain.).  09/15/2019: Last dose at noon today  . ALPRAZolam (XANAX) 0.5 MG tablet TAKE BY MOUTH UP TO 3 TIMES DAILY. WARNING: BENZODIAZEPINES INCREASE THE RISK OF FALLS AND NEUROCOGNITIVE DECLINE IN THE ELDERLY (Patient taking differently: Take 0.5-1 mg by mouth See admin instructions. Take 1 tablet (0.5 mg) by mouth twice daily & take 2 tablets(1 mg) by mouth in the evening.)   . b complex vitamins tablet Take 1 tablet by mouth daily.   . calcium carbonate (OSCAL)  1500 (600 Ca) MG TABS tablet Take 600 mg by mouth 2 (two) times daily.   . Carbidopa-Levodopa ER (SINEMET CR) 25-100 MG tablet controlled release Take 1 tablet by mouth 4 (four) times daily.   . Cholecalciferol (VITAMIN D) 2000 UNITS tablet Take 2,000 Units by mouth daily.   Marland Kitchen dicyclomine (BENTYL) 10 MG capsule dicyclomine 10 mg capsule   10 mg by oral route.   Marland Kitchen FLUoxetine (PROZAC) 10 MG capsule Take 10 mg by mouth daily.   . Probiotic Product (ALIGN PO) Take 1 capsule by mouth daily.    Marland Kitchen SYNTHROID 25 MCG tablet TAKE 1 TABLET DAILY BEFORE BREAKFAST   . omeprazole (PRILOSEC) 20 MG capsule Take 1 capsule (20 mg total) by mouth 2 (two) times daily before a meal. (Patient not taking: Reported on 09/15/2019) 09/03/2019: Once daily, as needed.   No facility-administered encounter medications on file as of 09/15/2019.   Allergies  Allergen Reactions  . Iodine Anaphylaxis    IV and topical forms.  Burnard Leigh [Hyoscyamine Sulfate]     Vision problems/pt has glaucoma  . Salmon [Fish Allergy] Hives and Shortness Of Breath  . Shellfish Allergy Anaphylaxis  . Tramadol Swelling  . Remeron [Mirtazapine] Other (See Comments)    Cause blurred vision and red eyes, pt has glaucoma  . Aspirin Other (See Comments)    Sever stomach pain due to ulcer scaring.  . Ciprofloxacin Diarrhea  . Codeine Nausea And Vomiting  .  Darvocet [Propoxyphene N-Acetaminophen] Nausea And Vomiting  . Demerol  [Meperidine Hcl]   . Demerol [Meperidine] Nausea Only  . Dexilant [Dexlansoprazole] Swelling    Redness, swelling and peeling of both feet.  . Diphedryl [Diphenhydramine] Other (See Comments)    Increased pulse/small amount ok  . Doxycycline Hyclate Other (See Comments)    GI intolerance.  Marland Kitchen Epinephrine Other (See Comments)    Breathing problems  . Erythromycin Other (See Comments)    GI intolerance.  Yvette Rack [Cyclobenzaprine] Other (See Comments)    Tingly/prickly sensation.  Marland Kitchen Keflex [Cephalexin] Hives  .  Latex Other (See Comments)    Gloves ok.  Skin gets red from elastic in underwear and latex bandaides.  . Nitrofurantoin Diarrhea  . Other   . Prednisone Other (See Comments)    Headache  . Pylera [Bis Subcit-Metronid-Tetracyc] Swelling    Tongue swelling. Face tingling  . Sulfa Antibiotics Other (See Comments)    Increased pulse, fainting, diarrhea, thrush  . Xylocaine [Lidocaine Hcl]     With epinephrine, given by dentist.  Speeded up heart rate and she passed out (occured twice, at dentist)  . Zoloft [Sertraline Hcl] Swelling and Other (See Comments)    Migraine Swelling of tongue/lip (09/2012)  . Advil [Ibuprofen] Other (See Comments)    Motrin ok with a GI effect.  . Biaxin [Clarithromycin] Rash    Started after completing 10 day course of 2000 mg /day, Lips swelling   ROS: no fever, chills, URI symptoms, headaches.  GI complaints per HPI. No dysuria, odor, hematuria.  No vaginal discharge. No bloody or black bowel movements.  +back pain (since fracture).  Some pain her in left hip and thigh--worries about a lump in her leg that hurts.     PHYSICAL EXAM:  BP 140/60   Pulse 68   Temp (!) 96.6 F (35.9 C) (Other (Comment))   Ht 4\' 10"  (1.473 m)   Wt 101 lb (45.8 kg)   LMP  (LMP Unknown)   BMI 21.11 kg/m   Wt Readings from Last 3 Encounters:  09/15/19 101 lb (45.8 kg)  09/03/19 100 lb 3.2 oz (45.5 kg)  07/28/19 102 lb (46.3 kg)   Elderly, frail female, accompanied by her husband, in fair spirits today (better than at last visit), in no distress.  HEENT: conjunctiva and sclera are clear, EOMI, wearing mask. Neck: no lymphadenopathy, thyromegaly or mass Heart: regular rate and rhythm Lungs: clear bilaterally Back: no CVA tenderness Chest:  She is slightly tender at left mid-breast area, extending laterally from the L costochondral junction.  Has some tenderness along CC junctions at floating ribs as well, extending up. Pain also extends laterally to the left ribs.  No  bony step-offs, no bruising. Abdomen: soft, no epigastric tenderness, hepatosplenomegaly or masses.  Abdomen is soft, no masses.  Her discomfort was more at her left lower ribs, no true abdominal tenderness on exam. Extremities: Mildly tender at the left trochanteric bursa Lipoma left thigh is nontender   ASSESSMENT/PLAN:  Generalized abdominal pain - suspect gas and constipation a factor, as well as IBS. Currently improved (pain is MSK). Encouraged adequate fluid intake, fiber supplement daily  Osteoporosis of lumbar spine - still checking on Prolia info/coverage. Counseled re: calcium recommendations, diet vs tablets. May add to her constipation. Total intake of 1200-1500mg  rec  Trochanteric bursitis of left hip - she has been dx'd and treated in past for this. Reassured it was not the lipoma that was painful.  Chest wall  pain - in costonchondral area and ribs.  This is a patient with chronic pain. Encouraged warm compresse, topical meds (with lidocaine) prn   Handicap placard given ([facility]O).  Discussed her Mg supplement questions--no need (not on chronic PPI), may add to GI issues, cause loose stools.  All questions from pt and her husband were answered.  30 min visit.   There is no need for a magnesium supplement.  I would like for you to get a TOTAL of 1200-1500mg  of calcium daily, from ALL sources including your diet (milk, yogurt, cheese, greens).  Look at your labels.  You likely can cut back to just 1 calcium tablet daily, as long as you are getting 2-3 servings of calcium in your diet daily.  Please be sure to continue to take the benefiber every day.

## 2019-09-15 NOTE — Patient Instructions (Addendum)
There is no need for a magnesium supplement.  I would like for you to get a TOTAL of 1200-1500mg  of calcium daily, from ALL sources including your diet (milk, yogurt, cheese, greens).  Look at your labels.  You likely can cut back to just 1 calcium tablet daily, as long as you are getting 2-3 servings of calcium in your diet daily.  Please be sure to continue to take the benefiber every day.   Bone Health Bones protect organs, store calcium, anchor muscles, and support the whole body. Keeping your bones strong is important, especially as you get older. You can take actions to help keep your bones strong and healthy. Why is keeping my bones healthy important?  Keeping your bones healthy is important because your body constantly replaces bone cells. Cells get old, and new cells take their place. As we age, we lose bone cells because the body may not be able to make enough new cells to replace the old cells. The amount of bone cells and bone tissue you have is referred to as bone mass. The higher your bone mass, the stronger your bones. The aging process leads to an overall loss of bone mass in the body, which can increase the likelihood of:  Joint pain and stiffness.  Broken bones.  A condition in which the bones become weak and brittle (osteoporosis). A large decline in bone mass occurs in older adults. In women, it occurs about the time of menopause. What actions can I take to keep my bones healthy? Good health habits are important for maintaining healthy bones. This includes eating nutritious foods and exercising regularly. To have healthy bones, you need to get enough of the right minerals and vitamins. Most nutrition experts recommend getting these nutrients from the foods that you eat. In some cases, taking supplements may also be recommended. Doing certain types of exercise is also important for bone health. What are the nutritional recommendations for healthy bones?  Eating a  well-balanced diet with plenty of calcium and vitamin D will help to protect your bones. Nutritional recommendations vary from person to person. Ask your health care provider what is healthy for you. Here are some general guidelines. Get enough calcium Calcium is the most important (essential) mineral for bone health. Most people can get enough calcium from their diet, but supplements may be recommended for people who are at risk for osteoporosis. Good sources of calcium include:  Dairy products, such as low-fat or nonfat milk, cheese, and yogurt.  Dark green leafy vegetables, such as bok choy and broccoli.  Calcium-fortified foods, such as orange juice, cereal, bread, soy beverages, and tofu products.  Nuts, such as almonds. Follow these recommended amounts for daily calcium intake:  Children, age 69-3: 700 mg.  Children, age 11-8: 1,000 mg.  Children, age 117-13: 1,300 mg.  Teens, age 66-18: 1,300 mg.  Adults, age 27-50: 1,000 mg.  Adults, age 27-70: ? Men: 1,000 mg. ? Women: 1,200 mg.  Adults, age 113 or older: 1,200 mg.  Pregnant and breastfeeding females: ? Teens: 1,300 mg. ? Adults: 1,000 mg. Get enough vitamin D Vitamin D is the most essential vitamin for bone health. It helps the body absorb calcium. Sunlight stimulates the skin to make vitamin D, so be sure to get enough sunlight. If you live in a cold climate or you do not get outside often, your health care provider may recommend that you take vitamin D supplements. Good sources of vitamin D in your diet include:  Egg yolks.  Saltwater fish.  Milk and cereal fortified with vitamin D. Follow these recommended amounts for daily vitamin D intake:  Children and teens, age 23-18: 600 international units.  Adults, age 31 or younger: 400-800 international units.  Adults, age 44 or older: 800-1,000 international units. Get other important nutrients Other nutrients that are important for bone health include:  Phosphorus.  This mineral is found in meat, poultry, dairy foods, nuts, and legumes. The recommended daily intake for adult men and adult women is 700 mg.  Magnesium. This mineral is found in seeds, nuts, dark green vegetables, and legumes. The recommended daily intake for adult men is 400-420 mg. For adult women, it is 310-320 mg.  Vitamin K. This vitamin is found in green leafy vegetables. The recommended daily intake is 120 mg for adult men and 90 mg for adult women. What type of physical activity is best for building and maintaining healthy bones? Weight-bearing and strength-building activities are important for building and maintaining healthy bones. Weight-bearing activities cause muscles and bones to work against gravity. Strength-building activities increase the strength of the muscles that support bones. Weight-bearing and muscle-building activities include:  Walking and hiking.  Jogging and running.  Dancing.  Gym exercises.  Lifting weights.  Tennis and racquetball.  Climbing stairs.  Aerobics. Adults should get at least 30 minutes of moderate physical activity on most days. Children should get at least 60 minutes of moderate physical activity on most days. Ask your health care provider what type of exercise is best for you. How can I find out if my bone mass is low? Bone mass can be measured with an X-ray test called a bone mineral density (BMD) test. This test is recommended for all women who are age 7 or older. It may also be recommended for:  Men who are age 76 or older.  People who are at risk for osteoporosis because of: ? Having bones that break easily. ? Having a long-term disease that weakens bones, such as kidney disease or rheumatoid arthritis. ? Having menopause earlier than normal. ? Taking medicine that weakens bones, such as steroids, thyroid hormones, or hormone treatment for breast cancer or prostate cancer. ? Smoking. ? Drinking three or more alcoholic drinks a  day. If you find that you have a low bone mass, you may be able to prevent osteoporosis or further bone loss by changing your diet and lifestyle. Where can I find more information? For more information, check out the following websites:  [employer]: AviationTales.fr  [employer] of Health: www.bones.SouthExposed.es  International Osteoporosis Foundation: Administrator.iofbonehealth.org Summary  The aging process leads to an overall loss of bone mass in the body, which can increase the likelihood of broken bones and osteoporosis.  Eating a well-balanced diet with plenty of calcium and vitamin D will help to protect your bones.  Weight-bearing and strength-building activities are also important for building and maintaining strong bones.  Bone mass can be measured with an X-ray test called a bone mineral density (BMD) test. This information is not intended to replace advice given to you by your health care provider. Make sure you discuss any questions you have with your health care provider. Document Revised: 08/06/2017 Document Reviewed: 08/06/2017 Elsevier Patient Education  2020 [employer].

## 2019-09-16 ENCOUNTER — Encounter: Payer: Self-pay | Admitting: Family Medicine

## 2019-09-16 DIAGNOSIS — H2000 Unspecified acute and subacute iridocyclitis: Secondary | ICD-10-CM | POA: Diagnosis not present

## 2019-09-17 DIAGNOSIS — M545 Low back pain: Secondary | ICD-10-CM | POA: Diagnosis not present

## 2019-09-17 DIAGNOSIS — R262 Difficulty in walking, not elsewhere classified: Secondary | ICD-10-CM | POA: Diagnosis not present

## 2019-09-17 DIAGNOSIS — S32000D Wedge compression fracture of unspecified lumbar vertebra, subsequent encounter for fracture with routine healing: Secondary | ICD-10-CM | POA: Diagnosis not present

## 2019-09-18 ENCOUNTER — Telehealth: Payer: Self-pay | Admitting: Family Medicine

## 2019-09-18 NOTE — Telephone Encounter (Signed)
Pt was called concerning Prolia. I was calling to advise insurance approval and make an appt. She advised me that she is receiving her 2nd COVID injection today. Please advise pt (and me) how long she will need to wait to receive her first Prolia injection. I will call her back and schedule after that date.

## 2019-09-18 NOTE — Telephone Encounter (Signed)
Pt informed and I will call back to schedule after appropriate time.

## 2019-09-18 NOTE — Telephone Encounter (Signed)
Wait 2 weeks please

## 2019-09-23 ENCOUNTER — Ambulatory Visit (INDEPENDENT_AMBULATORY_CARE_PROVIDER_SITE_OTHER): Payer: Medicare Other | Admitting: Psychiatry

## 2019-09-23 DIAGNOSIS — F411 Generalized anxiety disorder: Secondary | ICD-10-CM

## 2019-09-26 ENCOUNTER — Telehealth: Payer: Self-pay | Admitting: *Deleted

## 2019-09-26 DIAGNOSIS — H61021 Chronic perichondritis of right external ear: Secondary | ICD-10-CM | POA: Diagnosis not present

## 2019-09-26 DIAGNOSIS — D225 Melanocytic nevi of trunk: Secondary | ICD-10-CM | POA: Diagnosis not present

## 2019-09-26 DIAGNOSIS — L570 Actinic keratosis: Secondary | ICD-10-CM | POA: Diagnosis not present

## 2019-09-26 DIAGNOSIS — L218 Other seborrheic dermatitis: Secondary | ICD-10-CM | POA: Diagnosis not present

## 2019-09-26 DIAGNOSIS — L814 Other melanin hyperpigmentation: Secondary | ICD-10-CM | POA: Diagnosis not present

## 2019-09-26 DIAGNOSIS — D692 Other nonthrombocytopenic purpura: Secondary | ICD-10-CM | POA: Diagnosis not present

## 2019-09-26 DIAGNOSIS — D1801 Hemangioma of skin and subcutaneous tissue: Secondary | ICD-10-CM | POA: Diagnosis not present

## 2019-09-26 DIAGNOSIS — Z85828 Personal history of other malignant neoplasm of skin: Secondary | ICD-10-CM | POA: Diagnosis not present

## 2019-09-26 DIAGNOSIS — L821 Other seborrheic keratosis: Secondary | ICD-10-CM | POA: Diagnosis not present

## 2019-09-26 NOTE — Telephone Encounter (Signed)
Called and left a message to schedule a Palliative care home visit. Information left for return call.

## 2019-09-30 ENCOUNTER — Telehealth: Payer: Self-pay | Admitting: *Deleted

## 2019-09-30 NOTE — Telephone Encounter (Signed)
Called and spoke with patient to arrange a Palliative care home visit. Visit scheduled for 10/06/19 at 2p.

## 2019-10-02 ENCOUNTER — Telehealth: Payer: Self-pay | Admitting: *Deleted

## 2019-10-02 DIAGNOSIS — H10413 Chronic giant papillary conjunctivitis, bilateral: Secondary | ICD-10-CM | POA: Diagnosis not present

## 2019-10-02 NOTE — Telephone Encounter (Signed)
Patient called and asked if you think it would be good for her to use protein powder to gain some weight, any medical reason why she shouldn't? Also she hasn't had a chance to check on Prolia coverage yet but wanted to know if you wanted her to have her bone density first anyway?

## 2019-10-02 NOTE — Telephone Encounter (Signed)
Patient advised and Juliann Pulse is working on her coverage for Prolia.

## 2019-10-02 NOTE — Telephone Encounter (Signed)
I believe Tracy Malone should be working on the [name] for her.  Check with her.  I do not think she needs to add any protein powders.  Just make sure she gets adequate protein and caloric intake from her diet and supplements (Boost, etc).  She is so sensitive to so many things, I'd hate for her to start something and have it cause her any GI problems.

## 2019-10-06 ENCOUNTER — Ambulatory Visit (INDEPENDENT_AMBULATORY_CARE_PROVIDER_SITE_OTHER): Payer: Medicare Other | Admitting: Family Medicine

## 2019-10-06 ENCOUNTER — Ambulatory Visit: Payer: Medicare Other | Admitting: Family Medicine

## 2019-10-06 ENCOUNTER — Telehealth: Payer: Self-pay | Admitting: *Deleted

## 2019-10-06 ENCOUNTER — Other Ambulatory Visit: Payer: Self-pay

## 2019-10-06 ENCOUNTER — Encounter: Payer: Self-pay | Admitting: Family Medicine

## 2019-10-06 VITALS — BP 160/80 | HR 76 | Temp 97.7°F | Ht <= 58 in | Wt 100.2 lb

## 2019-10-06 DIAGNOSIS — R1013 Epigastric pain: Secondary | ICD-10-CM

## 2019-10-06 DIAGNOSIS — G8929 Other chronic pain: Secondary | ICD-10-CM

## 2019-10-06 DIAGNOSIS — M545 Low back pain, unspecified: Secondary | ICD-10-CM

## 2019-10-06 DIAGNOSIS — M810 Age-related osteoporosis without current pathological fracture: Secondary | ICD-10-CM

## 2019-10-06 DIAGNOSIS — R0789 Other chest pain: Secondary | ICD-10-CM | POA: Diagnosis not present

## 2019-10-06 DIAGNOSIS — K5909 Other constipation: Secondary | ICD-10-CM

## 2019-10-06 DIAGNOSIS — F332 Major depressive disorder, recurrent severe without psychotic features: Secondary | ICD-10-CM | POA: Diagnosis not present

## 2019-10-06 NOTE — Progress Notes (Signed)
Chief Complaint  Patient presents with  . Nausea    and stomach burning-starts ont he left side and goes across. Started this morning when she woke up-has been off and on since last year but worse this am.    She woke up this morning at 3am with L upper abdominal "swelling" and burning, felt nauseated.  Pain spread across to the right side, entire upper stomach, burning sensation.  She was afraid to take anything, just drank some water.  It isn't as bad as it was this morning, but still feels a little swelling and burning. She had 2 bowel movements this morning, normal/soft.  She thinks a fiber supplement is helping keep her bowels regular, the miralax caused diarrhea so stopped using it. Her husband is concerned that she isn't eating enough.  She took omeprazole before dinner last night, takes it daily. Hasn't taken any of her meds today other than 1/2 xanax, but otherwise taking them regularly.  Last night had toast with butter at 9pm. Otherwise tries to avoid eating late at night. Eats a bland diet, small portions. Last night for dinner she had pork tenderloin, carrots and potato.  Ate cereal and banana today, no change in pain (not better or worse after eating).   She has had chronic issues with abdominal pain, has seen GI multiple times.  This is different than the cramping in the lower stomach that she has gotten (for which the antispasmodic is effective).  PMH, PSH, SH reviewed  Outpatient Encounter Medications as of 10/06/2019  Medication Sig Note  . ALPRAZolam (XANAX) 0.5 MG tablet TAKE BY MOUTH UP TO 3 TIMES DAILY. WARNING: BENZODIAZEPINES INCREASE THE RISK OF FALLS AND NEUROCOGNITIVE DECLINE IN THE ELDERLY 10/06/2019: Took 1/2 tablet this morning  . omeprazole (PRILOSEC) 20 MG capsule Take 1 capsule (20 mg total) by mouth 2 (two) times daily before a meal. 10/06/2019: Takes daily before dinner.  Took last night  . acetaminophen (TYLENOL) 500 MG tablet Take 500-1,000 mg by mouth 3  (three) times daily as needed (pain.).  09/15/2019: Last dose at noon today  . b complex vitamins tablet Take 1 tablet by mouth daily.   . calcium carbonate (OSCAL) 1500 (600 Ca) MG TABS tablet Take 600 mg by mouth 2 (two) times daily.   . Carbidopa-Levodopa ER (SINEMET CR) 25-100 MG tablet controlled release Take 1 tablet by mouth 4 (four) times daily. (Patient not taking: Reported on 10/06/2019) 10/06/2019: Hasn't taken it today  . Cholecalciferol (VITAMIN D) 2000 UNITS tablet Take 2,000 Units by mouth daily.   Marland Kitchen dicyclomine (BENTYL) 10 MG capsule dicyclomine 10 mg capsule   10 mg by oral route.   Marland Kitchen FLUoxetine (PROZAC) 10 MG capsule Take 10 mg by mouth daily.   . Probiotic Product (ALIGN PO) Take 1 capsule by mouth daily.    Marland Kitchen SYNTHROID 25 MCG tablet TAKE 1 TABLET DAILY BEFORE BREAKFAST (Patient not taking: Reported on 10/06/2019) 10/06/2019: Just didn't take today   No facility-administered encounter medications on file as of 10/06/2019.   Allergies  Allergen Reactions  . Iodine Anaphylaxis    IV and topical forms.  Burnard Leigh [Hyoscyamine Sulfate]     Vision problems/pt has glaucoma  . Salmon [Fish Allergy] Hives and Shortness Of Breath  . Shellfish Allergy Anaphylaxis  . Tramadol Swelling  . Remeron [Mirtazapine] Other (See Comments)    Cause blurred vision and red eyes, pt has glaucoma  . Aspirin Other (See Comments)    Sever stomach pain  due to ulcer scaring.  . Ciprofloxacin Diarrhea  . Codeine Nausea And Vomiting  . Darvocet [Propoxyphene N-Acetaminophen] Nausea And Vomiting  . Demerol  [Meperidine Hcl]   . Demerol [Meperidine] Nausea Only  . Dexilant [Dexlansoprazole] Swelling    Redness, swelling and peeling of both feet.  . Diphedryl [Diphenhydramine] Other (See Comments)    Increased pulse/small amount ok  . Doxycycline Hyclate Other (See Comments)    GI intolerance.  Marland Kitchen Epinephrine Other (See Comments)    Breathing problems  . Erythromycin Other (See Comments)    GI  intolerance.  Yvette Rack [Cyclobenzaprine] Other (See Comments)    Tingly/prickly sensation.  Marland Kitchen Keflex [Cephalexin] Hives  . Latex Other (See Comments)    Gloves ok.  Skin gets red from elastic in underwear and latex bandaides.  . Nitrofurantoin Diarrhea  . Other   . Prednisone Other (See Comments)    Headache  . Pylera [Bis Subcit-Metronid-Tetracyc] Swelling    Tongue swelling. Face tingling  . Sulfa Antibiotics Other (See Comments)    Increased pulse, fainting, diarrhea, thrush  . Xylocaine [Lidocaine Hcl]     With epinephrine, given by dentist.  Speeded up heart rate and she passed out (occured twice, at dentist)  . Zoloft [Sertraline Hcl] Swelling and Other (See Comments)    Migraine Swelling of tongue/lip (09/2012)  . Advil [Ibuprofen] Other (See Comments)    Motrin ok with a GI effect.  . Biaxin [Clarithromycin] Rash    Started after completing 10 day course of 2000 mg /day, Lips swelling   ROS:  No fever, chills. +abdominal pain per HPI, improved since this morning, but still has some. Nausea has improved.  No bowel changes. No urinary complaints.  +depression and anxiety. No changes in beds. No dizziness, chest pain, URI symptoms or other complaints.  She does have some chronic pain (back).  PHYSICAL EXAM:  BP (!) 160/80   Pulse 76   Temp 97.7 F (36.5 C) (Other (Comment))   Ht 4\' 10"  (1.473 m)   Wt 100 lb 3.2 oz (45.5 kg)   LMP  (LMP Unknown)   BMI 20.94 kg/m   Wt Readings from Last 3 Encounters:  10/06/19 100 lb 3.2 oz (45.5 kg)  09/15/19 101 lb (45.8 kg)  09/03/19 100 lb 3.2 oz (45.5 kg)   Elderly, thin female.  She is upset that she feels terrible, that she can't feel good, and became tearful in discussing how worried she is about her son. HEENT: conjunctiva and sclera are clear, anicteric, EOMI. Wearing mask Neck: no lymphadenopathy or mass Heart: regular rate and rhythm Lungs: clear bilaterally--She had pain with deep breath when seated (not when supine),  and found to have tenderness at the lower left lateral ribs. Abdomen: Minimally tender in epigastrium. Some intermittent deep tenderness at L abdomen.  No masses or swelling. Active bowel sounds. Back: No spinal tenderness. Tender at L SI joint and muscles in lumbar paraspinous area (trigger point). Extremities: no edema Skin: normal turgor Psych: depressed mood, flattened affect.  Not smiling or joking today, as she has at prior visits.   ASSESSMENT/PLAN:   Epigastric pain - burning and "swelling" sensation. compliant with PPI. Suspect component of gas, try simethicone (ie mylanta+).  Chest wall pain - tender along lower left lateral ribs, pain with deep breaths when seated only, normal lung exam. Moist heat, tylenol prn  Osteoporosis of lumbar spine - to contact Juliann Pulse when ready to start Prolia  Chronic constipation - controlled with fiber  supplement daily  Chronic bilateral low back pain without sciatica - related to compression fracture, and also with trigger points (h/o fibromyalgia). Moist heat, topical meds, tylenol prn  Severe recurrent major depression without psychotic features ([place]) - encouraged continued counseling.  Discussed focusing attention elsewhere, not internally on pain (somaticizing)--hobbies, gratitude journal, prayer   Possibly some component of gas to your discomfort. Burning often can be related to acid.  I recommend trying Simethicone/antacid combo (ie Mylanta Plus) as needed for the burning and discomfort that you are having now. You are also having musculoskeletal pain (muscles in the back, and the left ribcage) Heat, massage and topical medications. You can also use tylenol if needed.  Be sure to eat--it doesn't seem to affecting your pain.  Try and not think only of your pain. Think of things that make you happy and make you smile.  Count your blessings. You may do something that gives you joy and makes you feel good about yourself--ie a manicure or  your hair done. Feel free to find a hobby that gives you joy--I suggest gardening rather than a pet.  Anxiety and depression can contribute to pain.

## 2019-10-06 NOTE — Patient Instructions (Addendum)
  Possibly some component of gas to your discomfort. Burning often can be related to acid.  I recommend trying Simethicone/antacid combo (ie Mylanta Plus) as needed for the burning and discomfort that you are having now. You are also having musculoskeletal pain (muscles in the back, and the left ribcage) Heat, massage and topical medications. You can also use tylenol if needed.  Be sure to eat--it doesn't seem to affecting your pain.  Try and not think only of your pain. Think of things that make you happy and make you smile.  Count your blessings. You may do something that gives you joy and makes you feel good about yourself--ie a manicure or your hair done. Feel free to find a hobby that gives you joy--I suggest gardening rather than a pet.  Anxiety and depression can contribute to pain.

## 2019-10-06 NOTE — Telephone Encounter (Signed)
8:59a Received a message stating that patient needed to cancel our visit today, as she has had to make an appointment with her PCP. She requests to re-schedule.  4:27p Called and spoke with patient to re-schedule Palliative care visit. Visit scheduled for 10/13/19 at 11a.

## 2019-10-08 ENCOUNTER — Telehealth: Payer: Self-pay | Admitting: Family Medicine

## 2019-10-08 NOTE — Telephone Encounter (Signed)
Pt was called and appt for Prolia was made. Pt has concerns about side effects. Please advise pt at [phone]

## 2019-10-08 NOTE — Telephone Encounter (Signed)
Tried to call patient, no answer. Will try again

## 2019-10-08 NOTE — Telephone Encounter (Signed)
We have discussed this at prior visit. There is a long list of potential side effects (including pain, headache, etc). I have many patients taking this treatment and haven't had any concerning side effects. I don't want her to worry about them, I feel it is safe and recommend the treatment.

## 2019-10-08 NOTE — Telephone Encounter (Signed)
Patient advised and she wanted you to know tha tshe set up appt with Dr. Christen Butter office to have DEXA dine there 10/28/19 and they will send results to you (she will ask them to).

## 2019-10-12 DIAGNOSIS — F5101 Primary insomnia: Secondary | ICD-10-CM | POA: Diagnosis not present

## 2019-10-12 DIAGNOSIS — F410 Panic disorder [episodic paroxysmal anxiety] without agoraphobia: Secondary | ICD-10-CM | POA: Diagnosis not present

## 2019-10-12 DIAGNOSIS — F332 Major depressive disorder, recurrent severe without psychotic features: Secondary | ICD-10-CM | POA: Diagnosis not present

## 2019-10-13 ENCOUNTER — Other Ambulatory Visit: Payer: Medicare Other | Admitting: *Deleted

## 2019-10-13 ENCOUNTER — Other Ambulatory Visit: Payer: Self-pay

## 2019-10-13 DIAGNOSIS — M7062 Trochanteric bursitis, left hip: Secondary | ICD-10-CM | POA: Diagnosis not present

## 2019-10-13 DIAGNOSIS — Z515 Encounter for palliative care: Secondary | ICD-10-CM

## 2019-10-13 DIAGNOSIS — M25551 Pain in right hip: Secondary | ICD-10-CM | POA: Diagnosis not present

## 2019-10-13 DIAGNOSIS — M25552 Pain in left hip: Secondary | ICD-10-CM | POA: Diagnosis not present

## 2019-10-14 ENCOUNTER — Ambulatory Visit: Payer: Medicare Other | Admitting: Gastroenterology

## 2019-10-15 NOTE — Progress Notes (Addendum)
COMMUNITY PALLIATIVE CARE RN NOTE  PATIENT NAME: Tracy Malone DOB: 1931-12-03 MRN: [id number]  PRIMARY CARE PROVIDER: [name], MD  RESPONSIBLE PARTY: [name] (husband) Acct ID - Guarantor Home Phone Work Phone Relationship Acct Type  [id number] [place], [id number] [phone] Self P/F     [address], [place], [place] [address]   Covid-19 Pre-screening Negative  PLAN OF CARE and INTERVENTION:  1. ADVANCE CARE PLANNING/GOALS OF CARE: Goal is for patient to feel better overall and remain at home with her husband. She is a Full code. 2. PATIENT/CAREGIVER EDUCATION: Symptom management, pain management, safe mobility 3. DISEASE STATUS: Met with patient and her husband, Tracy Malone, in their home. Patient reports that she has been having issues with her stomach, such as pain and bloating. She had an appointment last week with her PCP. She has a long history of IBS. She says that it feels like something is inside of her abdomen on the left side. She also says that her L rib is sore. She is also experiencing pain in the both her right and left hip joints. She says that this pain started about 3 weeks ago after a physical therapy session and makes walking painful and difficult. She is unable to sleep on her left side d/t pain. She states that her mid back is still slightly swollen. She had a massage at [employer] and says this was helpful. She does not like taking pain medications, but does use heating pads and topical analgesics. She remains ambulatory w/o the use of assistive devices. She leans slightly to the right. Her left shoulder is higher than the right. During visit, she is having difficulty getting comfortable while sitting on the couch. She is able to shower and dress herself independently, but is taking longer to complete this task d/t stiffness and pain. She has an appointment with her Ortho MD this evening at 3:15p. She is supposed to start receiving Prolia injections next  Monday. She also reports issues with her left eye. She is having ocular migraines which causes her to have blurred vision and seeing halos in this eye. She woke up this morning and her reports that her left eye was swollen, but appears normal at time of this visit. Her intake is poor-fair. She eats about 2 meals/day of small portions. She is continent of both bowel and bladder. She reports sleeping well last pm, as she is now taking a higher dose of Xanax, 1 mg at bedtime. She does feel tired much of the day. She says that she continues with crying spells, but they do not seem to be occurring as often as they did in the past. She continues to speak with her counselor weekly. Will continue to monitor.   HISTORY OF PRESENT ILLNESS:  This is a 86 yo female who resides at home with her husband. Palliative care continues to visit patient monthly and PRN.   CODE STATUS: Full code ADVANCED DIRECTIVES: Y  MOST FORM: no PPS: 50%   PHYSICAL EXAM:   LUNGS: clear to auscultation  CARDIAC: Cor RRR EXTREMITIES: No edema SKIN: Exposed skin is dry and intact; denies skin issues  NEURO: Alert and oriented x 3, right hand tremors, generalized weakness, ambulatory   (Duration of visit and documentation 45 minutes)   [name], RN BSN

## 2019-10-17 ENCOUNTER — Other Ambulatory Visit: Payer: Medicare Other

## 2019-10-20 ENCOUNTER — Other Ambulatory Visit (INDEPENDENT_AMBULATORY_CARE_PROVIDER_SITE_OTHER): Payer: Medicare Other

## 2019-10-20 ENCOUNTER — Other Ambulatory Visit: Payer: Self-pay

## 2019-10-20 DIAGNOSIS — M810 Age-related osteoporosis without current pathological fracture: Secondary | ICD-10-CM

## 2019-10-20 MED ORDER — DENOSUMAB 60 MG/ML ~~LOC~~ SOSY
60.0000 mg | PREFILLED_SYRINGE | Freq: Once | SUBCUTANEOUS | Status: AC
  Administered 2019-10-20: 60 mg via SUBCUTANEOUS

## 2019-10-21 ENCOUNTER — Ambulatory Visit (INDEPENDENT_AMBULATORY_CARE_PROVIDER_SITE_OTHER): Payer: Medicare Other | Admitting: Psychiatry

## 2019-10-21 DIAGNOSIS — F411 Generalized anxiety disorder: Secondary | ICD-10-CM | POA: Diagnosis not present

## 2019-10-29 ENCOUNTER — Encounter: Payer: Self-pay | Admitting: Gastroenterology

## 2019-10-29 ENCOUNTER — Ambulatory Visit (INDEPENDENT_AMBULATORY_CARE_PROVIDER_SITE_OTHER): Payer: Medicare Other | Admitting: Gastroenterology

## 2019-10-29 VITALS — BP 108/60 | HR 72 | Temp 97.5°F | Ht <= 58 in | Wt 96.8 lb

## 2019-10-29 DIAGNOSIS — R634 Abnormal weight loss: Secondary | ICD-10-CM

## 2019-10-29 DIAGNOSIS — R112 Nausea with vomiting, unspecified: Secondary | ICD-10-CM | POA: Insufficient documentation

## 2019-10-29 DIAGNOSIS — R6881 Early satiety: Secondary | ICD-10-CM | POA: Diagnosis not present

## 2019-10-29 DIAGNOSIS — R194 Change in bowel habit: Secondary | ICD-10-CM | POA: Diagnosis not present

## 2019-10-29 DIAGNOSIS — R630 Anorexia: Secondary | ICD-10-CM | POA: Insufficient documentation

## 2019-10-29 DIAGNOSIS — R1012 Left upper quadrant pain: Secondary | ICD-10-CM | POA: Diagnosis not present

## 2019-10-29 HISTORY — DX: Nausea with vomiting, unspecified: R11.2

## 2019-10-29 MED ORDER — SUPREP BOWEL PREP KIT 17.5-3.13-1.6 GM/177ML PO SOLN: ORAL | 0 refills | Status: DC

## 2019-10-29 NOTE — Progress Notes (Signed)
I agree with the above note, plan 

## 2019-10-29 NOTE — Progress Notes (Signed)
10/29/2019 Tracy Malone PW:[id number] 1932/04/08   HISTORY OF PRESENT ILLNESS: This is a pleasant 86 year old female who is a patient of Dr. Ardis Hughs.  Has longstanding IBS.  Is here today with her husband.  Comes in with several different complaints.  Reports pain in her left upper quadrant.  Says she feels like she is not digesting her food and just sits there.  She says some mornings she wakes up nauseated.  She has a poor appetite.  Is able to only eat very little at a time.  No vomiting.  The states she is becoming weak because of not eating much.  Weight is down 7 pounds since 06/2019 when she was last seen here.  Reports that stools alternate between constipation and loose stools.  She uses Benefiber about 1 tablespoon daily.  Uses prune juice and MiraLAX when needed for constipation.  Not currently taking anything regularly for acid reflux type symptoms.  Previously was on omeprazole.  Says that now she is just been using Mylanta as needed.  Says that she does not even need it on a daily basis, but once again she is not eating much either.  Reports lactose intolerance.  EGD 08/2016 showed mild gastritis.  CT scan of the abdomen and pelvis without contrast in 03/2019 was unremarkable.  Fairly recent labs unremarkable including CBC, CMP, and TSH.  It appears that she had a lot of these similar symptoms back in 2016 at which time a gastric emptying scan was ordered and was normal.  They are requesting EGD and colonoscopy to evaluate and be sure that there are no other underlying issues causing her symptoms.   Past Medical History:  Diagnosis Date  . Bell's palsy 1966   Hx: right side facial droop, resolved per patient 04/02/19  . Carotid artery disease ([place]) 2010   on vascular screening;unchanged 2013.(could not tolerate simvastatin, no other statins tried)--<30% blockage bilat 07/2011  . Chronic abdominal pain   . Chronic fatigue and malaise   . Claustrophobia   . Depression    treated  in the past for years;stopped in 2010 for a years  . Duodenal ulcer 1962   h/o  . Dysrhythmia    ocassional PVC's, no current problems per patient on 04/02/19  . Fibromyalgia   . Frequent PVCs 07/2012   Seen by [facility]: benign, asymptomatic, normal EF  . GERD (gastroesophageal reflux disease)    diet controlled  . Glaucoma, narrow-angle    s/p laser surgery  . History of hiatal hernia    during endoscopy  . Hypothyroid 9/08  . IBS (irritable bowel syndrome)    Dr. Benson Norway  . Ischemic colitis ([place]) 11/21/2018   no current problems per patient on 04/02/19  . Ocular migraine   . Osteoporosis 10/11   Dr.Hawkes  . Panic attack   . Parkinson disease ([place])   . Recurrent UTI    has cystocele-Dr.Grewal  . Shingles 1999   h/o  . Superficial thrombophlebitis 03/2009   RLE  . Trochanteric bursitis 12/2008   bilateral   Past Surgical History:  Procedure Laterality Date  . ABDOMINAL HYSTERECTOMY    . CATARACT EXTRACTION, BILATERAL  1995, 1996  . EYE SURGERY Bilateral    laser - glaucoma  . Flexible sigmoidoscopy    . KYPHOPLASTY N/A 04/03/2019   Procedure: KYPHOPLASTY L1;  Surgeon: Melina Schools, MD;  Location: [facility];  Service: Orthopedics;  Laterality: N/A;  90 mins  . THYROIDECTOMY, PARTIAL  09/2005  L nodule; Dr. Harlow Asa  . TONSILLECTOMY  1946  . UPPER GI ENDOSCOPY  06/27/12  . VAGINAL HYSTERECTOMY  1971   and bladder repair.  Still has ovaries  . WISDOM TOOTH EXTRACTION      reports that she has never smoked. She has never used smokeless tobacco. She reports that she does not drink alcohol or use drugs. family history includes Diabetes in her granddaughter and maternal grandfather; HIV in her son; Heart disease in her brother and mother; Hypertension in her mother and sister; Lung cancer in her brother. Allergies  Allergen Reactions  . Iodine Anaphylaxis    IV and topical forms.  Burnard Leigh [Hyoscyamine Sulfate]     Vision problems/pt has glaucoma  . Salmon [Fish Allergy]  Hives and Shortness Of Breath  . Shellfish Allergy Anaphylaxis  . Tramadol Swelling  . Remeron [Mirtazapine] Other (See Comments)    Cause blurred vision and red eyes, pt has glaucoma  . Aspirin Other (See Comments)    Sever stomach pain due to ulcer scaring.  . Ciprofloxacin Diarrhea  . Codeine Nausea And Vomiting  . Darvocet [Propoxyphene N-Acetaminophen] Nausea And Vomiting  . Demerol  [Meperidine Hcl]   . Demerol [Meperidine] Nausea Only  . Dexilant [Dexlansoprazole] Swelling    Redness, swelling and peeling of both feet.  . Diphedryl [Diphenhydramine] Other (See Comments)    Increased pulse/small amount ok  . Doxycycline Hyclate Other (See Comments)    GI intolerance.  Marland Kitchen Epinephrine Other (See Comments)    Breathing problems  . Erythromycin Other (See Comments)    GI intolerance.  Yvette Rack [Cyclobenzaprine] Other (See Comments)    Tingly/prickly sensation.  Marland Kitchen Keflex [Cephalexin] Hives  . Latex Other (See Comments)    Gloves ok.  Skin gets red from elastic in underwear and latex bandaides.  . Nitrofurantoin Diarrhea  . Other   . Prednisone Other (See Comments)    Headache  . Pylera [Bis Subcit-Metronid-Tetracyc] Swelling    Tongue swelling. Face tingling  . Sulfa Antibiotics Other (See Comments)    Increased pulse, fainting, diarrhea, thrush  . Xylocaine [Lidocaine Hcl]     With epinephrine, given by dentist.  Speeded up heart rate and she passed out (occured twice, at dentist)  . Zoloft [Sertraline Hcl] Swelling and Other (See Comments)    Migraine Swelling of tongue/lip (09/2012)  . Advil [Ibuprofen] Other (See Comments)    Motrin ok with a GI effect.  . Biaxin [Clarithromycin] Rash    Started after completing 10 day course of 2000 mg /day, Lips swelling      Outpatient Encounter Medications as of 10/29/2019  Medication Sig  . acetaminophen (TYLENOL) 500 MG tablet Take 500-1,000 mg by mouth 3 (three) times daily as needed (pain.).   Marland Kitchen ALPRAZolam (XANAX) 0.5 MG  tablet TAKE BY MOUTH UP TO 3 TIMES DAILY. WARNING: BENZODIAZEPINES INCREASE THE RISK OF FALLS AND NEUROCOGNITIVE DECLINE IN THE ELDERLY  . b complex vitamins tablet Take 1 tablet by mouth daily.  . calcium carbonate (OSCAL) 1500 (600 Ca) MG TABS tablet Take 600 mg by mouth 2 (two) times daily.  . Carbidopa-Levodopa ER (SINEMET CR) 25-100 MG tablet controlled release Take 1 tablet by mouth 4 (four) times daily.  . Cholecalciferol (VITAMIN D) 2000 UNITS tablet Take 2,000 Units by mouth daily.  Marland Kitchen dicyclomine (BENTYL) 10 MG capsule dicyclomine 10 mg capsule   10 mg by oral route.  Marland Kitchen FLUoxetine (PROZAC) 10 MG capsule Take 10 mg by mouth daily.  Marland Kitchen  omeprazole (PRILOSEC) 20 MG capsule Take 1 capsule (20 mg total) by mouth 2 (two) times daily before a meal.  . Probiotic Product (ALIGN PO) Take 1 capsule by mouth daily.   Marland Kitchen SYNTHROID 25 MCG tablet TAKE 1 TABLET DAILY BEFORE BREAKFAST   No facility-administered encounter medications on file as of 10/29/2019.     REVIEW OF SYSTEMS  : All other systems reviewed and negative except where noted in the History of Present Illness.   PHYSICAL EXAM: BP 108/60   Pulse 72   Temp (!) 97.5 F (36.4 C)   Ht 4\' 10"  (1.473 m)   Wt 96 lb 12.8 oz (43.9 kg)   LMP  (LMP Unknown)   BMI 20.23 kg/m  General: Well developed white female in no acute distress Head: Normocephalic and atraumatic Eyes:  Sclerae anicteric, conjunctiva pink. Ears: Normal auditory acuity Lungs: Clear throughout to auscultation; no increased WOB. Heart: Regular rate and rhythm; no M/R/G. Abdomen: Soft, non-distended.  BS present.  Non-tender. Rectal:  Will be done at the time of colonoscopy. Musculoskeletal: Symmetrical with no gross deformities  Skin: No lesions on visible extremities Extremities: No edema  Neurological: Alert oriented x 4, grossly non-focal Psychological:  Alert and cooperative. Normal mood and affect  ASSESSMENT AND PLAN: *86 year old female with history of IBS  and ongoing GI complaints of bowel habits alternating between constipation and loose stools, nausea, early satiety, poor appetite, left upper quadrant abdominal pain.  Has lost 7 pounds since she was last seen here in December.  CT scan in September 2020 was unremarkable.  Labs unremarkable.  Symptoms sound consistent with gastroparesis, but she had similar complaints in the past and GES was normal in 2016.  EGD with just gastritis in 2018.  Last colonoscopy several years ago.  She desires evaluation with EGD and colonoscopy to be sure that there are no other underlying issues.  We will plan for EGD and colonoscopy with Dr. Ardis Hughs.  The risks, benefits, and alternatives to EGD and colonoscopy were discussed with the patient and she consents to proceed.  I searched the Internet while she is here and they do make an Ensure that is plant-based that is lactose-free, gluten-free, dairy free.  She is going to get some of those to try and advised that she may want to make some type of shake or smoothies out of them in the blender with adding some fruits and could also consider putting her Benefiber in that as well.   CC:  [name], MD

## 2019-10-29 NOTE — Patient Instructions (Addendum)
If you are age 83 or older, your body mass index should be between 23-30. Your Body mass index is 20.23 kg/m. If this is out of the aforementioned range listed, please consider follow up with your Primary Care Provider.  If you are age 24 or younger, your body mass index should be between 19-25. Your Body mass index is 20.23 kg/m. If this is out of the aformentioned range listed, please consider follow up with your Primary Care Provider.    You have been scheduled for an endoscopy and colonoscopy. Please follow the written instructions given to you at your visit today. Please pick up your prep supplies at the pharmacy within the next 1-3 days. If you use inhalers (even only as needed), please bring them with you on the day of your procedure. Your physician has requested that you go to www.startemmi.com and enter the access code given to you at your visit today. This web site gives a general overview about your procedure. However, you should still follow specific instructions given to you by our office regarding your preparation for the procedure.   Thank you for entrusting me with your care and for choosing [employer], Alonza Bogus, P.A. - C.

## 2019-10-31 ENCOUNTER — Telehealth: Payer: Self-pay | Admitting: Gastroenterology

## 2019-10-31 DIAGNOSIS — H04123 Dry eye syndrome of bilateral lacrimal glands: Secondary | ICD-10-CM | POA: Diagnosis not present

## 2019-10-31 DIAGNOSIS — H0100A Unspecified blepharitis right eye, upper and lower eyelids: Secondary | ICD-10-CM | POA: Diagnosis not present

## 2019-10-31 DIAGNOSIS — G43909 Migraine, unspecified, not intractable, without status migrainosus: Secondary | ICD-10-CM | POA: Diagnosis not present

## 2019-10-31 DIAGNOSIS — H0100B Unspecified blepharitis left eye, upper and lower eyelids: Secondary | ICD-10-CM | POA: Diagnosis not present

## 2019-10-31 NOTE — Telephone Encounter (Signed)
Left message on machine to call back  

## 2019-10-31 NOTE — Telephone Encounter (Signed)
Patient is returning your call.  

## 2019-11-03 NOTE — Telephone Encounter (Signed)
The pt has been advised that she will have biopsy of the area during her procedure and H pylori will detected if present. No further testing needed prior to the app for procedures.  The pt has been advised of the information and verbalized understanding.

## 2019-11-04 ENCOUNTER — Other Ambulatory Visit: Payer: Medicare Other | Admitting: *Deleted

## 2019-11-04 ENCOUNTER — Other Ambulatory Visit: Payer: Medicare Other

## 2019-11-04 ENCOUNTER — Other Ambulatory Visit: Payer: Self-pay

## 2019-11-04 DIAGNOSIS — Z515 Encounter for palliative care: Secondary | ICD-10-CM

## 2019-11-05 NOTE — Progress Notes (Signed)
COMMUNITY PALLIATIVE CARE SW NOTE  PATIENT NAME: Tracy Malone DOB: 11/26/1931 MRN: [id number]  PRIMARY CARE PROVIDER: [name], MD  RESPONSIBLE PARTY:  Acct ID - Guarantor Home Phone Work Phone Relationship Acct Type  [id number] [name] Self P/F     [address], [place], [place] [address]     PLAN OF CARE and INTERVENTIONS:             1. GOALS OF CARE/ ADVANCE CARE PLANNING: Patient has advanced directives. 2. SOCIAL/EMOTIONAL/SPIRITUAL ASSESSMENT/ INTERVENTIONS: Palliative Care SW and RN-M.Howard visited patient and her husband at their home. She joined the visit shortly after the team arrived. She denied pain. Patient reported that she has not returned to physical therapy as she feels she has maximized it's benefit. She stated she received a Cortizone injection that provided immediate relief to her. She  has received information from her physician about Parkinson's groups and counseling that is available in the area.Patient has a meeting scheduled this week with [name] for individual counseling. Patient feels that she will get better benefit from counseling if it was face-to--face, individualized and not in a group setting. Her husband report that patient has increased crying spells and moodiness. Patient admitted to this, but is aware that it is a part of her disease progression, but has feelings of sadness generally because she has a experienced loss with the disease. SW provided empathetic listening, supportive counseling, validation and normalization of feelings and reassurance of support.  3. PATIENT/CAREGIVER EDUCATION/ COPING: Patient is alert and oriented x3. She seemed to be openly engaged with the team. She shared her excitement about the upcoming wedding of her granddaughter. However she also shared concern about her husband's health status the frustration she often feels around his response to her concerns. She remains independent of ADL's.  Her husband and daughter remain very supportive of her and has daily contact via telephone or visits. 4. PERSONAL EMERGENCY PLAN: 911 can be activated for emergencies. 5. COMMUNITY RESOURCES COORDINATION/ HEALTH CARE NAVIGATION: Patient has individual counseling scheduled with [name] this week. She continues to explore other resources and data about her disease.  6. FINANCIAL/LEGAL CONCERNS/INTERVENTIONS: No financial or legal concerns at this time.      SOCIAL HX:  Social History   Tobacco Use  . Smoking status: Never Smoker  . Smokeless tobacco: Never Used  Substance Use Topics  . Alcohol use: No    Alcohol/week: 0.0 standard drinks    CODE STATUS:   Code Status: Prior  ADVANCED DIRECTIVES: N MOST FORM COMPLETE:  Yes HOSPICE EDUCATION PROVIDED: No  PPS: Patient ambulated independently. She has intermittent pain to her hip area, but feels that current medication manages this. Patient is alert and oriented x3 and can make her needs/wishes known.  I spent 60 minutes with patient providing her introduction to this SW as new palliative care social worker, empathetic listening, supportive counseling, normalization and validation of feelings, reassurance of support.       [address] [place], [place]

## 2019-11-06 NOTE — Progress Notes (Signed)
COMMUNITY PALLIATIVE CARE RN NOTE  PATIENT NAME: Tracy Malone DOB: 02-10-1932 MRN: [id number]  PRIMARY CARE PROVIDER: [name], MD  RESPONSIBLE PARTY: Tracy Malone (husband) Acct ID - Guarantor Home Phone Work Phone Relationship Acct Type  [id number] Tracy Malone, Tracy [phone] Self P/F     [address], [place], [place] [address]   Covid-19 Pre-screening Negative  PLAN OF CARE and INTERVENTION:  1. ADVANCE CARE PLANNING/GOALS OF CARE: Patient's goal is to continue to get stronger. She is a Full code. 2. PATIENT/CAREGIVER EDUCATION: Symptom management, safe mobility/transfers 3. DISEASE STATUS: Joint visit made with Palliative care SW, [name]. Met with patient and her husband, [name], who is also a Palliative patient. Patient reports some back soreness and has difficulties getting comfortable with sitting/lying at times. During visit last month, she stated pain in her left hip. She did receive a steroid injection for this and says that it helped almost immediately. She has not been back to Physical therapy, but states when she calls them back, she will tell them she is more interested in lighter therapy, such as walking on a treadmill. She says that her balance is more steady and she has been able to navigate the stairs recently without difficulty. She is able to perform ADLs independently, but is no longer able to do housework. She has a housekeeper that comes monthly. She has been having issues with her stomach. She feels as though she is not emptying her bowels completely. She is taking Benefiber and having daily BMs. She experiences bloating after eating any type of food. She also feels like there is something inside the area of her left side/abdomen. She is scheduled for a sigmoidoscopy and colonoscopy at the end of the month. She has a poor appetite. She says she is losing weight and is currently 96 lbs. She has weaned herself off of her Prozac about a week ago.  Initially, she felt that it was helping with her mood and sadness, but feels that over time these feelings started to increase. She talks with her counselor weekly, which is helpful. She has a right hand tremor. She continues on her Carbidopa/Levodopa. She is taking Xanax to help with anxiety. Will continue to monitor.  HISTORY OF PRESENT ILLNESS: This is a 86 yo female who resides at home with her husband. Palliative care team continues to follow patient and visits monthly and PRN.   CODE STATUS: Full code ADVANCED DIRECTIVES: Y MOST FORM: no PPS: 50%   PHYSICAL EXAM:   LUNGS: clear to auscultation  CARDIAC: Cor RRR EXTREMITIES: No edema SKIN: Exposed skin is dry and intact  NEURO: Alert and oriented x 3, generalized weakness, ambulatory   (Duration of visit and documentation 45 minutes)   [name], RN BSN

## 2019-11-12 ENCOUNTER — Encounter: Payer: Self-pay | Admitting: Family Medicine

## 2019-11-20 ENCOUNTER — Telehealth: Payer: Self-pay | Admitting: Gastroenterology

## 2019-11-20 ENCOUNTER — Ambulatory Visit (INDEPENDENT_AMBULATORY_CARE_PROVIDER_SITE_OTHER): Payer: Medicare Other | Admitting: Family Medicine

## 2019-11-20 ENCOUNTER — Encounter: Payer: Self-pay | Admitting: Family Medicine

## 2019-11-20 ENCOUNTER — Telehealth: Payer: Self-pay | Admitting: *Deleted

## 2019-11-20 ENCOUNTER — Other Ambulatory Visit: Payer: Self-pay

## 2019-11-20 VITALS — BP 130/70 | HR 64 | Temp 97.4°F | Ht <= 58 in | Wt 99.6 lb

## 2019-11-20 DIAGNOSIS — K1379 Other lesions of oral mucosa: Secondary | ICD-10-CM | POA: Diagnosis not present

## 2019-11-20 DIAGNOSIS — R682 Dry mouth, unspecified: Secondary | ICD-10-CM

## 2019-11-20 DIAGNOSIS — J309 Allergic rhinitis, unspecified: Secondary | ICD-10-CM | POA: Diagnosis not present

## 2019-11-20 NOTE — Telephone Encounter (Signed)
I spoke to Dr. Loletha Carrow about the allergy, and he stated that it was fine for the patient to take Suprep. I talked with the pharmacist and she stated that same thing.  The patient states that she is okay with taking it, but is still worried.  She will take the prep and call us if she has issues.

## 2019-11-20 NOTE — Telephone Encounter (Signed)
Called the patient and she had many questions about her medication.  She wanted to know if she could take them the day of her procedure.  I told her that she could, and that she is to drink only clear liquids the day before her procedure.   She was informed not to drink anything red or purple.  She gave good feedback.

## 2019-11-20 NOTE — Patient Instructions (Signed)
  There is some redness on your upper palate, otherwise your exam is normal.  Avoid acidic and spicy or salty foods. Avoid hard foods/chips. Use Biotene spray regularly, as dry mouth can make this worse. You do have evidence of some allergies (swelling on the left side of the nose, on the inside). Try taking loratidine (claritin) once daily.  You can also use saline nasal spray or mist. Use warm compresses to the inner part of your left eye.  Contact us in the next few days if you would like to try either viscous lidocaine or Magic Mouthwash, if your mouth pain is getting worse rather than improving.

## 2019-11-20 NOTE — Telephone Encounter (Signed)
Patient has questions about medications. Please call her. 

## 2019-11-20 NOTE — Telephone Encounter (Signed)
Called patient back because she had concerns about the suprep. Patient stated that she has a sulfa allergy and was concerned about the ingredients in the suprep. She stated that the suprep ingredients contained sodium sulfate, and potassium sulfate. She stated that she gets tachycardic, lightheaded, and has fainted in the past as a result from sulfa medication. She wanted to know if it would be safe for her to have the suprep. Admitting RN called pharmacy and inquired about the concern of the patient. Pharmacy representative stated that it would be okay for the patient to take the suprep. Advised patient that it would be okay for her to drink the prep. She vocalized understanding. Gave the patient the after hours number to call should she have any issues throughout the night with the prep.

## 2019-11-20 NOTE — Progress Notes (Signed)
Chief Complaint  Patient presents with  . Mouth swelling    started Tuesday, she woke up and her mouth started feeling swollen and her lips as well. Lips feel red and rigid. She is having EGD and colonoscopy Friday.    She is complaining of discomfort in her mouth and lips.  Symptoms started 2 days ago. She reports that her lips feel "puffy".  She states the roof of her mouth is sore, tender, feels bumpy when she rubs her tongue along it.  No change in toothpaste, uses Sensodyne. No change in mouthwash or other products.  Denies change in diet, no acidic/citrus foods, denies any burns to mouth. Last saw dentist 4/15. Wakes up with dry mouth sometimes, uses Biotene spray periodically, not recently.  Saw eye doctor a couple of weeks ago--L eye was draining "white stuff", crusty in the mornings. Treated with drops.  Drainage has improved (only intermittent), eye still doesn't feel quite right.  She stopped taking B12 in 11/2018 (had taken for years). No recent B12 levels.  Last CBC normal. Lab Results  Component Value Date   WBC 6.9 09/03/2019   HGB 14.4 09/03/2019   HCT 42.5 09/03/2019   MCV 88 09/03/2019   PLT 286 09/03/2019    PMH, PSH, SH reviewed  Outpatient Encounter Medications as of 11/20/2019  Medication Sig  . SYNTHROID 25 MCG tablet TAKE 1 TABLET DAILY BEFORE BREAKFAST  . acetaminophen (TYLENOL) 500 MG tablet Take 500-1,000 mg by mouth 3 (three) times daily as needed (pain.).   Marland Kitchen ALPRAZolam (XANAX) 0.5 MG tablet TAKE BY MOUTH UP TO 3 TIMES DAILY. WARNING: BENZODIAZEPINES INCREASE THE RISK OF FALLS AND NEUROCOGNITIVE DECLINE IN THE ELDERLY  . b complex vitamins tablet Take 1 tablet by mouth daily.  . calcium carbonate (OSCAL) 1500 (600 Ca) MG TABS tablet Take 600 mg by mouth 2 (two) times daily.  . Carbidopa-Levodopa ER (SINEMET CR) 25-100 MG tablet controlled release Take 1 tablet by mouth 4 (four) times daily.  . Cholecalciferol (VITAMIN D) 2000 UNITS tablet Take 2,000 Units  by mouth daily.  Marland Kitchen dicyclomine (BENTYL) 10 MG capsule dicyclomine 10 mg capsule   10 mg by oral route.  Marland Kitchen FLUoxetine (PROZAC) 10 MG capsule Take 10 mg by mouth daily.  Marland Kitchen omeprazole (PRILOSEC) 20 MG capsule Take 1 capsule (20 mg total) by mouth 2 (two) times daily before a meal. (Patient not taking: Reported on 11/20/2019)  . Probiotic Product (ALIGN PO) Take 1 capsule by mouth daily.   . [DISCONTINUED] SUPREP BOWEL PREP KIT 17.5-3.13-1.6 GM/177ML SOLN Suprep-Use as directed   No facility-administered encounter medications on file as of 11/20/2019.   Allergies  Allergen Reactions  . Iodine Anaphylaxis    IV and topical forms.  Burnard Leigh [Hyoscyamine Sulfate]     Vision problems/pt has glaucoma  . Salmon [Fish Allergy] Hives and Shortness Of Breath  . Shellfish Allergy Anaphylaxis  . Tramadol Swelling  . Remeron [Mirtazapine] Other (See Comments)    Cause blurred vision and red eyes, pt has glaucoma  . Aspirin Other (See Comments)    Sever stomach pain due to ulcer scaring.  . Ciprofloxacin Diarrhea  . Codeine Nausea And Vomiting  . Darvocet [Propoxyphene N-Acetaminophen] Nausea And Vomiting  . Demerol  [Meperidine Hcl]   . Demerol [Meperidine] Nausea Only  . Dexilant [Dexlansoprazole] Swelling    Redness, swelling and peeling of both feet.  . Diphedryl [Diphenhydramine] Other (See Comments)    Increased pulse/small amount ok  . Doxycycline  Hyclate Other (See Comments)    GI intolerance.  Marland Kitchen Epinephrine Other (See Comments)    Breathing problems  . Erythromycin Other (See Comments)    GI intolerance.  Yvette Rack [Cyclobenzaprine] Other (See Comments)    Tingly/prickly sensation.  Marland Kitchen Keflex [Cephalexin] Hives  . Latex Other (See Comments)    Gloves ok.  Skin gets red from elastic in underwear and latex bandaides.  . Nitrofurantoin Diarrhea  . Other   . Prednisone Other (See Comments)    Headache  . Pylera [Bis Subcit-Metronid-Tetracyc] Swelling    Tongue swelling. Face  tingling  . Sulfa Antibiotics Other (See Comments)    Increased pulse, fainting, diarrhea, thrush  . Xylocaine [Lidocaine Hcl]     With epinephrine, given by dentist.  Speeded up heart rate and she passed out (occured twice, at dentist)  . Zoloft [Sertraline Hcl] Swelling and Other (See Comments)    Migraine Swelling of tongue/lip (09/2012)  . Advil [Ibuprofen] Other (See Comments)    Motrin ok with a GI effect.  . Biaxin [Clarithromycin] Rash    Started after completing 10 day course of 2000 mg /day, Lips swelling   ROS:  No fever, chills, URI symptoms.  Denies headaches, sinus pain, sore throat cough.  +dry mouth. +nasal congestion and slight PND.  No chest pain, shortness of breath.  No nausea, vomiting.  Occasional L sided low back pain, which when it flares sometimes goes down the left leg. No numbness/tingling. No bowel/bladder changes.  Scheduled for EGD and colonoscopy tomorrow.  PHYSICAL EXAM:  BP 130/70   Pulse 64   Temp (!) 97.4 F (36.3 C) (Other (Comment))   Ht '4\' 10"'$  (1.473 m)   Wt 99 lb 9.6 oz (45.2 kg)   LMP  (LMP Unknown)   BMI 20.82 kg/m   Thin, elderly female, accompanied by her husband, in fair spirits today (not as depressed as at other visits recently). HEENT: conjunctiva and sclera are clear, EOMI.  There is slight inflammation noted at the medial canthus of the L eye. No drainage or crusting. Nasal mucosa is mildly edematous, L>R (mod on the left), no erythema or purulence. Sinuses are nontender. OP: clear posteriorly.  Slight erythema just behind the upper front teeth on the right side, and in the center just posterior to this.  There are no ulcerations are other mucosal abnormalities.  Tongue appears normal Neck: no lymphadenopathy, thyromegaly or mass Heart; regular rate and rhythm Lungs: clear bilaterally Back: nontender.  Area of intermittent discomfort it at left lower paraspinous muscles, just above the SI joint area.  nontender  currently. Extremities: no edema Skin: no rashes, normal turgor  ASSESSMENT/PLAN:   Mouth pain - Exam is fairly normal, just slight erythema and some e/o allergies. no ulcers, thrush, e/o sinus infection. Pt reassured. Anbesol prn; Viscous lido if worse  Allergic rhinitis, unspecified seasonality, unspecified trigger - recommended daily claritin. Can also use saline spray prn  Dry mouth - encouraged to stay well hydrated, Biotene prn   There is some redness on your upper palate, otherwise your exam is normal.  Avoid acidic and spicy or salty foods. Avoid hard foods/chips. Use Biotene spray regularly, as dry mouth can make this worse. You do have evidence of some allergies (swelling on the left side of the nose, on the inside). Try taking loratidine (claritin) once daily.  You can also use saline nasal spray or mist. Use warm compresses to the inner part of your left eye.  Contact us  in the next few days if you would like to try either viscous lidocaine or Magic Mouthwash, if your mouth pain is getting worse rather than improving.

## 2019-11-21 ENCOUNTER — Ambulatory Visit (AMBULATORY_SURGERY_CENTER): Payer: Medicare Other | Admitting: Gastroenterology

## 2019-11-21 ENCOUNTER — Encounter: Payer: Self-pay | Admitting: Gastroenterology

## 2019-11-21 VITALS — BP 136/72 | HR 88 | Temp 97.1°F | Resp 18 | Ht 59.0 in | Wt 96.0 lb

## 2019-11-21 DIAGNOSIS — K2950 Unspecified chronic gastritis without bleeding: Secondary | ICD-10-CM

## 2019-11-21 DIAGNOSIS — R194 Change in bowel habit: Secondary | ICD-10-CM | POA: Diagnosis not present

## 2019-11-21 DIAGNOSIS — I2510 Atherosclerotic heart disease of native coronary artery without angina pectoris: Secondary | ICD-10-CM | POA: Diagnosis not present

## 2019-11-21 DIAGNOSIS — R1084 Generalized abdominal pain: Secondary | ICD-10-CM | POA: Diagnosis not present

## 2019-11-21 DIAGNOSIS — K5730 Diverticulosis of large intestine without perforation or abscess without bleeding: Secondary | ICD-10-CM | POA: Diagnosis not present

## 2019-11-21 DIAGNOSIS — R197 Diarrhea, unspecified: Secondary | ICD-10-CM | POA: Diagnosis not present

## 2019-11-21 DIAGNOSIS — R634 Abnormal weight loss: Secondary | ICD-10-CM | POA: Diagnosis not present

## 2019-11-21 DIAGNOSIS — R195 Other fecal abnormalities: Secondary | ICD-10-CM | POA: Diagnosis not present

## 2019-11-21 DIAGNOSIS — R1012 Left upper quadrant pain: Secondary | ICD-10-CM

## 2019-11-21 MED ORDER — SODIUM CHLORIDE 0.9 % IV SOLN: 500.0000 mL | Freq: Once | INTRAVENOUS | Status: DC

## 2019-11-21 NOTE — Op Note (Signed)
[facility] Patient Name: Tracy Malone Procedure Date: 11/21/2019 2:24 PM MRN: [id number] Endoscopist: Milus Banister , MD Age: 86 Referring MD:  Date of Birth: 1931/11/19 Gender: Female Account #: [id number] Procedure:                Upper GI endoscopy Indications:              Generalized abdominal pain, Anorexia, Nausea,                            Weight loss Medicines:                Monitored Anesthesia Care Procedure:                Pre-Anesthesia Assessment:                           - Prior to the procedure, a History and Physical                            was performed, and patient medications and                            allergies were reviewed. The patient's tolerance of                            previous anesthesia was also reviewed. The risks                            and benefits of the procedure and the sedation                            options and risks were discussed with the patient.                            All questions were answered, and informed consent                            was obtained. Prior Anticoagulants: The patient has                            taken no previous anticoagulant or antiplatelet                            agents. ASA Grade Assessment: III - A patient with                            severe systemic disease. After reviewing the risks                            and benefits, the patient was deemed in                            satisfactory condition to undergo the procedure.  After obtaining informed consent, the endoscope was                            passed under direct vision. Throughout the                            procedure, the patient's blood pressure, pulse, and                            oxygen saturations were monitored continuously. The                            Endoscope was introduced through the mouth, and                            advanced to the second part of duodenum. The  upper                            GI endoscopy was accomplished without difficulty.                            The patient tolerated the procedure well. Scope In: Scope Out: Findings:                 Mild inflammation characterized by erythema,                            friability and granularity was found in the entire                            examined stomach. Biopsies were taken with a cold                            forceps for histology.                           The exam was otherwise without abnormality. Complications:            No immediate complications. Estimated blood loss:                            None. Estimated Blood Loss:     Estimated blood loss: none. Impression:               - Mild pan-gastritis. Biopsied.                           - The examination was otherwise normal. Recommendation:           - Patient has a contact number available for                            emergencies. The signs and symptoms of potential                            delayed complications were discussed with the  patient. Return to normal activities tomorrow.                            Written discharge instructions were provided to the                            patient.                           - Resume previous diet.                           - Continue present medications.                           - Await pathology results. Milus Banister, MD 11/21/2019 2:56:38 PM This report has been signed electronically.

## 2019-11-21 NOTE — Progress Notes (Signed)
Vitals-DT Temp-LC  Patient non ambulatory.  Had to put her in a wheelchair in order to get her to the bathroom.  Dr. Ardis Hughs was notified regarding the patient's condition.  He wishes to go ahead with the  Procedure.  REviewed history. Patient is crying and states that she is very depressed. Pt denies desire to hurt herself.  She is not taking her antidepressent.

## 2019-11-21 NOTE — Op Note (Signed)
[facility] Patient Name: Tracy Malone Procedure Date: 11/21/2019 2:24 PM MRN: [id number] Endoscopist: Milus Banister , MD Age: 86 Referring MD:  Date of Birth: [birth year] Gender: Female Account #: [id number] Procedure:                Colonoscopy Indications:              Generalized abdominal pain, constipation, diarrhea,                            weight loss Medicines:                Monitored Anesthesia Care Procedure:                Pre-Anesthesia Assessment:                           - Prior to the procedure, a History and Physical                            was performed, and patient medications and                            allergies were reviewed. The patient's tolerance of                            previous anesthesia was also reviewed. The risks                            and benefits of the procedure and the sedation                            options and risks were discussed with the patient.                            All questions were answered, and informed consent                            was obtained. Prior Anticoagulants: The patient has                            taken no previous anticoagulant or antiplatelet                            agents. ASA Grade Assessment: III - A patient with                            severe systemic disease. After reviewing the risks                            and benefits, the patient was deemed in                            satisfactory condition to undergo the procedure.  After obtaining informed consent, the colonoscope                            was passed under direct vision. Throughout the                            procedure, the patient's blood pressure, pulse, and                            oxygen saturations were monitored continuously. The                            Colonoscope was introduced through the anus and                            advanced to the the cecum, identified by                          appendiceal orifice and ileocecal valve. The                            colonoscopy was performed without difficulty. The                            patient tolerated the procedure well. The quality                            of the bowel preparation was good. The ileocecal                            valve, appendiceal orifice, and rectum were                            photographed. Scope In: 2:38:10 PM Scope Out: [id number] PM Scope Withdrawal Time: 0 hours 4 minutes 11 seconds  Total Procedure Duration: 0 hours 8 minutes 2 seconds  Findings:                 Multiple small-mouthed diverticula were found in                            the left colon.                           The exam was otherwise without abnormality on                            direct and retroflexion views. Complications:            No immediate complications. Estimated blood loss:                            None. Estimated Blood Loss:     Estimated blood loss: none. Impression:               - Diverticulosis in the left colon.                           -  The examination was otherwise normal on direct                            and retroflexion views.                           - No specimens collected. Recommendation:           - EGD now.                           - No repeat colonoscopy. Milus Banister, MD 11/21/2019 2:49:04 PM This report has been signed electronically.

## 2019-11-21 NOTE — Progress Notes (Signed)
Called to room to assist during endoscopic procedure.  Patient ID and intended procedure confirmed with present staff. Received instructions for my participation in the procedure from the performing physician.  

## 2019-11-21 NOTE — Patient Instructions (Signed)
YOU HAD AN ENDOSCOPIC PROCEDURE TODAY AT THE [facility]:   Refer to the procedure report that was given to you for any specific questions about what was found during the examination.  If the procedure report does not answer your questions, please call your gastroenterologist to clarify.  If you requested that your care partner not be given the details of your procedure findings, then the procedure report has been included in a sealed envelope for you to review at your convenience later.  YOU SHOULD EXPECT: Some feelings of bloating in the abdomen. Passage of more gas than usual.  Walking can help get rid of the air that was put into your GI tract during the procedure and reduce the bloating. If you had a lower endoscopy (such as a colonoscopy or flexible sigmoidoscopy) you may notice spotting of blood in your stool or on the toilet paper. If you underwent a bowel prep for your procedure, you may not have a normal bowel movement for a few days.  Please Note:  You might notice some irritation and congestion in your nose or some drainage.  This is from the oxygen used during your procedure.  There is no need for concern and it should clear up in a day or so.  SYMPTOMS TO REPORT IMMEDIATELY:   Following lower endoscopy (colonoscopy or flexible sigmoidoscopy):  Excessive amounts of blood in the stool  Significant tenderness or worsening of abdominal pains  Swelling of the abdomen that is new, acute  Fever of 100F or higher   Following upper endoscopy (EGD)  Vomiting of blood or coffee ground material  New chest pain or pain under the shoulder blades  Painful or persistently difficult swallowing  New shortness of breath  Fever of 100F or higher  Black, tarry-looking stools  For urgent or emergent issues, a gastroenterologist can be reached at any hour by calling [phone]. Do not use MyChart messaging for urgent concerns.    DIET:  We do recommend a small meal at first, but  then you may proceed to your regular diet.  Drink plenty of fluids but you should avoid alcoholic beverages for 24 hours.  MEDICATIONS: Continue present medications.  Please see handouts given to you by your recovery nurse.  ACTIVITY:  You should plan to take it easy for the rest of today and you should NOT DRIVE or use heavy machinery until tomorrow (because of the sedation medicines used during the test).    FOLLOW UP: Our staff will call the number listed on your records 48-72 hours following your procedure to check on you and address any questions or concerns that you may have regarding the information given to you following your procedure. If we do not reach you, we will leave a message.  We will attempt to reach you two times.  During this call, we will ask if you have developed any symptoms of COVID 19. If you develop any symptoms (ie: fever, flu-like symptoms, shortness of breath, cough etc.) before then, please call [phone].  If you test positive for Covid 19 in the 2 weeks post procedure, please call and report this information to us.    If any biopsies were taken you will be contacted by phone or by letter within the next 1-3 weeks.  Please call us at [phone] if you have not heard about the biopsies in 3 weeks.   Thank you for allowing us to provide for your healthcare needs today.   SIGNATURES/CONFIDENTIALITY: You   and/or your care partner have signed paperwork which will be entered into your electronic medical record.  These signatures attest to the fact that that the information above on your After Visit Summary has been reviewed and is understood.  Full responsibility of the confidentiality of this discharge information lies with you and/or your care-partner.

## 2019-11-21 NOTE — Progress Notes (Signed)
Report given to PACU, vss 

## 2019-11-25 ENCOUNTER — Telehealth: Payer: Self-pay

## 2019-11-25 NOTE — Telephone Encounter (Signed)
  Follow up Call-  Call back number 11/21/2019  Post procedure Call Back phone  # [phone]  Permission to leave phone message Yes  Some recent data might be hidden     Patient questions:  Do you have a fever, pain , or abdominal swelling? No. Pain Score  0 *  Have you tolerated food without any problems? Yes.    Have you been able to return to your normal activities? Yes.    Do you have any questions about your discharge instructions: Diet   No. Medications  No. Follow up visit  No.  Do you have questions or concerns about your Care? No.  Actions: * If pain score is 4 or above: No action needed, pain <4.  1. Have you developed a fever since your procedure? No  2.   Have you had an respiratory symptoms (SOB or cough) since your procedure? No  3.   Have you tested positive for COVID 19 since your procedure No  4.   Have you had any family members/close contacts diagnosed with the COVID 19 since your procedure?  No   If yes to any of these questions please route to Joylene John, RN and Erenest Rasher, RN

## 2019-11-26 ENCOUNTER — Encounter: Payer: Self-pay | Admitting: Family Medicine

## 2019-11-26 ENCOUNTER — Telehealth: Payer: Self-pay | Admitting: Family Medicine

## 2019-11-26 MED ORDER — LIDOCAINE VISCOUS HCL 2 % MT SOLN
15.0000 mL | OROMUCOSAL | 0 refills | Status: DC | PRN
Start: 2019-11-26 — End: 2019-11-26

## 2019-11-26 MED ORDER — LIDOCAINE VISCOUS HCL 2 % MT SOLN: 15.0000 mL | OROMUCOSAL | 0 refills | Status: DC | PRN

## 2019-11-26 NOTE — Telephone Encounter (Signed)
Fax from [employer] Lidocaine 2% rx was for quantity 15 ml Fax states it comes in 100 ml and wants authorization for 100 ml

## 2019-11-26 NOTE — Telephone Encounter (Signed)
New rx sent with 138ml quantity.

## 2019-11-27 ENCOUNTER — Encounter: Payer: Self-pay | Admitting: Gastroenterology

## 2019-11-27 ENCOUNTER — Telehealth: Payer: Self-pay | Admitting: *Deleted

## 2019-11-27 NOTE — Telephone Encounter (Signed)
Received a call from patient to schedule a Palliative care home visit. Visit scheduled for 5/11 at 11:30a.

## 2019-12-02 ENCOUNTER — Other Ambulatory Visit: Payer: Self-pay

## 2019-12-02 ENCOUNTER — Other Ambulatory Visit: Payer: Medicare Other

## 2019-12-02 ENCOUNTER — Telehealth: Payer: Self-pay | Admitting: Gastroenterology

## 2019-12-02 ENCOUNTER — Other Ambulatory Visit: Payer: Medicare Other | Admitting: *Deleted

## 2019-12-02 DIAGNOSIS — Z515 Encounter for palliative care: Secondary | ICD-10-CM

## 2019-12-02 NOTE — Telephone Encounter (Signed)
The pt has been advised that a letter was mailed to her but we did discuss and she had no further concerns.  She will await the letter in the mail.

## 2019-12-03 ENCOUNTER — Other Ambulatory Visit: Payer: Self-pay

## 2019-12-04 ENCOUNTER — Telehealth: Payer: Self-pay | Admitting: *Deleted

## 2019-12-04 NOTE — Telephone Encounter (Signed)
Patient advised. She will call next week for appointment if needed.

## 2019-12-04 NOTE — Progress Notes (Signed)
COMMUNITY PALLIATIVE CARE SW NOTE  PATIENT NAME: Tracy Malone DOB: Dec 15, 1931 MRN: [id number]  PRIMARY CARE PROVIDER: [name], MD  RESPONSIBLE PARTY:  Acct ID - Guarantor Home Phone Work Phone Relationship Acct Type  [id number] [name] [phone] Self P/F     [address], [place], [place] [address]     PLAN OF CARE and INTERVENTIONS:             1. GOALS OF CARE/ ADVANCE CARE PLANNING: Patient is a DNR. The goal is for patient to remain in her home safely and as independent as possible.  2. SOCIAL/EMOTIONAL/SPIRITUAL ASSESSMENT/ INTERVENTIONS: SW and RN-M.Nadara Mustard completed a face-to-face visit with patient at her home where her husband was present with her. Patient continues to report she has pain to her left leg and lower back. She is sleeping better. She had her endotoscopy and colonoscopy and there was not issues. Her biopsy was also done and no concerns. She will see [name] for counseling in the next few weeks. She has had weight loss and her appetite is poor overall. Patient is excited about her granddaughter's upcoming wedding and has enjoyed shopping for it. SW provided supportive presence, active listening, normalized feelings. 3. PATIENT/CAREGIVER EDUCATION/ COPING: Patient's husband and daughter are very supportive of patient. Patient reports some depression about overall condition, isolation due to COVID and loss of independence. 4. PERSONAL EMERGENCY PLAN: 911 can be activated for any emergencies. 5. COMMUNITY RESOURCES COORDINATION/ HEALTH CARE NAVIGATION: SW reinforced access to palliative care support. [name] will be providing counseling to patient. 6. FINANCIAL/LEGAL CONCERNS/INTERVENTIONS:  No financial or legal issues presented.     SOCIAL HX:  Social History   Tobacco Use  . Smoking status: Never Smoker  . Smokeless tobacco: Never Used  Substance Use Topics  . Alcohol use: No    Alcohol/week: 0.0 standard drinks    CODE  STATUS:   Code Status: Prior DNR ADVANCED DIRECTIVES: N MOST FORM COMPLETE: No HOSPICE EDUCATION PROVIDED: No  PPS: Patient ambulate independently, but gait is unsteady. She has had some weight loss. She is independent for personal care needs. She is alert and oriented x3.   Duration of visit is 60 minutes      [employer], LCSW

## 2019-12-04 NOTE — Telephone Encounter (Signed)
Patient called and said since her Prolia shot she has been experiencing pain in her joints. Hips, legs and knees. She doesn't think it is connected to her surgery and wanted to know if this is side effect of the Prolia.

## 2019-12-04 NOTE — Telephone Encounter (Signed)
Injection was 3/29.  We documented some chronic knee pain at her wellness visit in 08/2019. So, while Prolia does potentially cause pain in extremities, at 86 yo, there are many other causes.  Having further fractures due to osteoporosis causes significantly more pain.  So, my recommendation is to continue with the injections every 6 months.  If she is having severe hip pain, she needs to be seen.

## 2019-12-07 NOTE — Progress Notes (Signed)
COMMUNITY PALLIATIVE CARE RN NOTE  PATIENT NAME: Tracy Malone DOB: 1932-01-17 MRN: [id number]  PRIMARY CARE PROVIDER: [name], MD  RESPONSIBLE PARTY: Tracy Malone (husband) Acct ID - Guarantor Home Phone Work Phone Relationship Acct Type  [id number] MAHROSH, [phone] Self P/F     [address], [place], [place] [address]   Covid-19 Pre-screening Negative  PLAN OF CARE and INTERVENTION:  1. ADVANCE CARE PLANNING/GOALS OF CARE: Goal is for her to continue to get stronger and wanting her back/leg to feel better. She is a Full code. 2. PATIENT/CAREGIVER EDUCATION: Symptom management, safe mobility 3. DISEASE STATUS: Joint visit made with Palliative care SW, [name]. Met with patient and husband, Tracy Malone, who is also a Palliative care patient. She reports pain in her left leg that radiates from her hip down to the bottom of her foot. She feels this is originating from her sciatic nerve. She is seeing a Restaurant manager, fast food for her back. She says that it still feels swollen. She questions whether she is having muscle spasms. She is considering acupuncture therapy. She does feel that her mobility is improving. She is now able to navigate up and down the stairs in her home, but is careful. She is interested in an upright rollator walker, especially when walking outside. She remains able to perform ADLs independently, but is unable to do household chores. She does have someone to clean her home monthly. She has ongoing issues with her stomach. She does not have much of an appetite, and often feels bloated. She has to be particular about what she eats. She had a recent endoscopy/colonoscopy. She says that a biopsy was done but was negative. She states that it showed some diverticulitis. She feels that her Parkinson's medication is causing her stomach to be upset. She did not take this medication for 2 days when preparing for her endoscopy and says her stomach felt fine. When she started  taking her Carbidopa/Levadopa again is when she noticed her stomach issues returning. She is going to speak with her Neurologist regarding this issue. She does have tremors in her right hand. She wears a wrist brace to help with stability. Her weight today is 102 lbs. Will continue to monitor.   HISTORY OF PRESENT ILLNESS:  This is a 86 yo female who resides at home with her husband. Palliative care team continues to follow patient. Will continue to visit her monthly and PRN.  CODE STATUS: Full code ADVANCED DIRECTIVES: Y MOST FORM: no PPS: 50%   PHYSICAL EXAM:  LUNGS: clear to auscultation  CARDIAC: Cor RRR EXTREMITIES: No edema SKIN: Exposed skin is dry and intact  NEURO: Alert and oriented x 3, right hand tremors, ambulatory without assistive devices   (Duration of visit and documentation 45 minutes)   [name], RN BSN

## 2019-12-08 ENCOUNTER — Encounter: Payer: Self-pay | Admitting: Family Medicine

## 2019-12-11 ENCOUNTER — Ambulatory Visit (INDEPENDENT_AMBULATORY_CARE_PROVIDER_SITE_OTHER): Payer: Medicare Other | Admitting: Psychology

## 2019-12-11 DIAGNOSIS — F411 Generalized anxiety disorder: Secondary | ICD-10-CM

## 2019-12-16 ENCOUNTER — Telehealth: Payer: Self-pay | Admitting: Gastroenterology

## 2019-12-16 NOTE — Telephone Encounter (Signed)
The pt wanted an appt to discuss digestive enzymes with Dr Ardis Hughs.  Appt was made for 7/28.  The pt has been advised

## 2019-12-18 ENCOUNTER — Encounter: Payer: Self-pay | Admitting: Family Medicine

## 2019-12-18 ENCOUNTER — Ambulatory Visit (INDEPENDENT_AMBULATORY_CARE_PROVIDER_SITE_OTHER): Payer: Medicare Other | Admitting: Family Medicine

## 2019-12-18 ENCOUNTER — Other Ambulatory Visit: Payer: Self-pay

## 2019-12-18 ENCOUNTER — Encounter: Payer: Self-pay | Admitting: *Deleted

## 2019-12-18 VITALS — BP 140/70 | HR 80 | Ht <= 58 in | Wt 100.0 lb

## 2019-12-18 DIAGNOSIS — G8929 Other chronic pain: Secondary | ICD-10-CM

## 2019-12-18 DIAGNOSIS — M7062 Trochanteric bursitis, left hip: Secondary | ICD-10-CM

## 2019-12-18 DIAGNOSIS — M7630 Iliotibial band syndrome, unspecified leg: Secondary | ICD-10-CM

## 2019-12-18 DIAGNOSIS — M545 Low back pain, unspecified: Secondary | ICD-10-CM

## 2019-12-18 NOTE — Patient Instructions (Signed)
Stop the Tylenol Extra Strength 500mg . Instead, ONLY take the Tylenol Arthritis, and take it as directed, every 8 hours.  Use ice to the areas when swollen. Use heating pad to your painful areas prior to stretching.  After using heat, do the hamstring and IT band stretches as shown today (seated--with legs straight out, reaching for your toes, and then reaching for your toes with each leg crossed over the other, getting both sides stretched equally).  You should use the Voltaren gel to the left hip where the bursa is inflamed and tender, and also to the bones at the side of the knees when they are swollen and painful (where the IT band inserts at the side of the knee).  Consider seeing your orthopedist for follow-up if your hip bursitis or knee pain is worsening.  You might benefit from physical therapy if not getting better.   Iliotibial Band Syndrome  Iliotibial band syndrome (ITBS) is a condition that often causes knee pain. It can also cause pain in the outside of your hip, thigh, and knee. The iliotibial band is a strip of tissue that runs from the outside of your hip and down your thigh to the outside of your knee. Repeatedly bending and straightening your knee can irritate the iliotibial band. What are the causes? This condition is caused by inflammation and irritation from the friction of the iliotibial band moving over the thigh bone (femur) when you repeatedly bend and straighten your knee. What increases the risk? This condition is more likely to develop in people who:  Frequently change elevation during their workouts.  Run very long distances.  Recently increased the length or intensity of their workouts.  Run downhill often, or just started running downhill.  Ride a bike very far or often. You may also be at greater risk if you start a new workout routine without first warming up or if you have a job that requires you to bend, squat, or climb frequently. What are the signs  or symptoms? Symptoms of this condition include:  Pain along the outside of your knee that may be worse with activity, especially running or going up and down stairs.  A "snapping" sensation over your knee.  Swelling on the outside of your knee.  Pain or a feeling of tightness in your hip. How is this diagnosed? This condition is diagnosed based on your symptoms, medical history, and physical exam. You may also see a health care provider who specializes in reducing pain and increasing mobility (physical therapist). A physical therapist may do an exam to check your balance, movement, and way of walking or running (gait) to see whether the way you move could contribute to your injury. You may also have tests to measure your strength, flexibility, and range of motion. How is this treated? Treatment for this condition includes:  Resting and limiting exercise.  Returning to activities gradually.  Doing range-of-motion and strengthening exercises (physical therapy) as told by your health care provider.  Including low-impact activities, such as swimming, in your exercise routine. Follow these instructions at home:  If directed, apply ice to the injured area. ? Put ice in a plastic bag. ? Place a towel between your skin and the bag. ? Leave the ice on for 20 minutes, 2-3 times per day.  Return to your normal activities as told by your health care provider. Ask your health care provider what activities are safe for you.  Keep all follow-up visits with your health care provider.  This is important. Contact a health care provider if:  Your pain does not improve or gets worse despite treatment. This information is not intended to replace advice given to you by your health care provider. Make sure you discuss any questions you have with your health care provider. Document Revised: 06/22/2017 Document Reviewed: 08/11/2016 Elsevier Patient Education  [place].

## 2019-12-18 NOTE — Progress Notes (Signed)
Chief Complaint  Patient presents with  . Knee Pain    having knee pain and trouble walking.    Wakes up at night with pain at the lateral aspect of both knees, reporting that this area looks swollen, L>R. She has pain along the lateral thighs and posterior thighs when she is walking.  She has some tightness and pain in her R hamstring. She still has pain on the L side of her back, and thinks she is walking differently, favoring one side.   Using Carlyss, only gives temporary help. Also using aspercreme with lidocaine, also helps some.  She takes Tylenol Arthritis 1 tablet at night, and 1000mg  of Extra Strength Tylenol in the morning, doesn't help.   Previously saw Dr. Tonita Cong in 01/2019 for knee pain--that was after an injury, different than her current pain (x-rays done of R knee at that time).  She is still having some problems with her eyes, the left one feels swollen.  Given Zaditor drops, but still doesn't feel normal.  Denies eye pain or vision changes.  Feels like she needs to blink more, like it is irritated.  She plans to schedule f/u with Dr. Satira Sark.   PMH, PSH, SH reviewed  Outpatient Encounter Medications as of 12/18/2019  Medication Sig  . ALPRAZolam (XANAX) 0.5 MG tablet TAKE BY MOUTH UP TO 3 TIMES DAILY. WARNING: BENZODIAZEPINES INCREASE THE RISK OF FALLS AND NEUROCOGNITIVE DECLINE IN THE ELDERLY  . b complex vitamins tablet Take 1 tablet by mouth daily.  . calcium carbonate (OSCAL) 1500 (600 Ca) MG TABS tablet Take 600 mg by mouth 2 (two) times daily.  . Carbidopa-Levodopa ER (SINEMET CR) 25-100 MG tablet controlled release Take 1 tablet by mouth 4 (four) times daily.  . Cholecalciferol (VITAMIN D) 2000 UNITS tablet Take 2,000 Units by mouth daily.  . Probiotic Product (ALIGN PO) Take 1 capsule by mouth daily.   Marland Kitchen SYNTHROID 25 MCG tablet TAKE 1 TABLET DAILY BEFORE BREAKFAST  . [DISCONTINUED] acetaminophen (TYLENOL) 500 MG tablet Take 500-1,000 mg by mouth 3  (three) times daily as needed (pain.).   Marland Kitchen dicyclomine (BENTYL) 10 MG capsule dicyclomine 10 mg capsule   10 mg by oral route.  . [DISCONTINUED] FLUoxetine (PROZAC) 10 MG capsule Take 10 mg by mouth daily.  . [DISCONTINUED] lidocaine (XYLOCAINE) 2 % solution Use as directed 15 mLs in the mouth or throat every 4 (four) hours as needed for mouth pain. Apply sparingly to area of discomfort in mouth  . [DISCONTINUED] omeprazole (PRILOSEC) 20 MG capsule Take 1 capsule (20 mg total) by mouth 2 (two) times daily before a meal. (Patient not taking: Reported on 11/20/2019)   Facility-Administered Encounter Medications as of 12/18/2019  Medication  . 0.9 %  sodium chloride infusion   Allergies  Allergen Reactions  . Iodine Anaphylaxis    IV and topical forms.  Burnard Leigh [Hyoscyamine Sulfate]     Vision problems/pt has glaucoma  . Salmon [Fish Allergy] Hives and Shortness Of Breath  . Shellfish Allergy Anaphylaxis  . Tramadol Swelling  . Remeron [Mirtazapine] Other (See Comments)    Cause blurred vision and red eyes, pt has glaucoma  . Aspirin Other (See Comments)    Sever stomach pain due to ulcer scaring.  . Ciprofloxacin Diarrhea  . Codeine Nausea And Vomiting  . Darvocet [Propoxyphene N-Acetaminophen] Nausea And Vomiting  . Demerol  [Meperidine Hcl]   . Demerol [Meperidine] Nausea Only  . Dexilant [Dexlansoprazole] Swelling    Redness,  swelling and peeling of both feet.  . Diphedryl [Diphenhydramine] Other (See Comments)    Increased pulse/small amount ok  . Doxycycline Hyclate Other (See Comments)    GI intolerance.  Marland Kitchen Epinephrine Other (See Comments)    Breathing problems  . Erythromycin Other (See Comments)    GI intolerance.  Yvette Rack [Cyclobenzaprine] Other (See Comments)    Tingly/prickly sensation.  Marland Kitchen Keflex [Cephalexin] Hives  . Latex Other (See Comments)    Gloves ok.  Skin gets red from elastic in underwear and latex bandaides.  . Nitrofurantoin Diarrhea  . Other   .  Prednisone Other (See Comments)    Headache  . Pylera [Bis Subcit-Metronid-Tetracyc] Swelling    Tongue swelling. Face tingling  . Sulfa Antibiotics Other (See Comments)    Increased pulse, fainting, diarrhea, thrush  . Xylocaine [Lidocaine Hcl]     With epinephrine, given by dentist.  Speeded up heart rate and she passed out (occured twice, at dentist)  . Zoloft [Sertraline Hcl] Swelling and Other (See Comments)    Migraine Swelling of tongue/lip (09/2012)  . Advil [Ibuprofen] Other (See Comments)    Motrin ok with a GI effect.  . Biaxin [Clarithromycin] Rash    Started after completing 10 day course of 2000 mg /day, Lips swelling   ROS: no fever, chills, no chest pain, shortness of breath. No URI symptoms.   +eye irritation per HPI.  No vomiting. Occ has some abdominal pain. Not currently having urinary complaints.  +bilateral knee pain, left hip pain, and left back pain per HPI. +depression, insomnia. Under care of psych. +tremor, unchanged.   PHYSICAL EXAM:  BP 140/70   Pulse 80   Ht 4\' 10"  (1.473 m)   Wt 100 lb (45.4 kg)   LMP  (LMP Unknown)   BMI 20.90 kg/m   Elderly female, in no distress, just many complaints of pain. HEENT: conjunctiva and sclera are clear, EOMI, wearing mask Neck: no lymphadenopathy, thyromegaly or mass Back:  No spinal tenderness, tender at L lumbar paraspinous muscles,  Abdomen: soft, nontender (very slight tenderness at LLQ and diffuse). Extremities: no edema, palpable pulses.  She is tender at TFL bilaterally, and at the L trochanteric bursa. nontender at knees, no effusion, no significant crepitus. Pain at the superior portion of fibula with inversion of the knee. Skin: normal turgor, no rash Psych: mildly depressed, full range of affect Neuro: alert and oriented, normal gait. +tremor of UE  ASSESSMENT/PLAN:  Iliotibial band syndrome, unspecified laterality - tender at TFL and complaining of lateral knee pain (normal knee exam); shown  stretches. Voltaren gel. Consider PT  Greater trochanteric bursitis of left hip - may use Voltarn gel. Consider f/u with ortho, prev has had cortisone injections from them for bursitis  Chronic left-sided low back pain, unspecified whether sciatica present   Shown IT band stretches and hamstring stretches.  Stop the Tylenol Extra Strength 500mg . Instead, ONLY take the Tylenol Arthritis, and take it as directed, every 8 hours.  Use heating pad to your painful areas.  After using heat, do the hamstring and IT band stretches as shown today (seated--with legs straight out, reaching for your toes, and then reaching for your toes with each leg crossed over the other, getting both sides stretched equally).  You should use the Voltaren gel to the left hip where the bursa is inflamed and tender, and also to the bones at the side of the knees when they are swollen and painful (where the IT band  inserts at the side of the knee).

## 2019-12-19 ENCOUNTER — Encounter: Payer: Self-pay | Admitting: Family Medicine

## 2019-12-23 ENCOUNTER — Ambulatory Visit: Payer: Medicare Other | Admitting: Psychology

## 2019-12-24 ENCOUNTER — Ambulatory Visit (INDEPENDENT_AMBULATORY_CARE_PROVIDER_SITE_OTHER): Payer: Medicare Other | Admitting: Psychology

## 2019-12-24 DIAGNOSIS — F411 Generalized anxiety disorder: Secondary | ICD-10-CM

## 2019-12-26 DIAGNOSIS — M545 Low back pain: Secondary | ICD-10-CM | POA: Diagnosis not present

## 2019-12-26 DIAGNOSIS — M519 Unspecified thoracic, thoracolumbar and lumbosacral intervertebral disc disorder: Secondary | ICD-10-CM | POA: Diagnosis not present

## 2019-12-30 DIAGNOSIS — F332 Major depressive disorder, recurrent severe without psychotic features: Secondary | ICD-10-CM | POA: Diagnosis not present

## 2019-12-30 DIAGNOSIS — F5101 Primary insomnia: Secondary | ICD-10-CM | POA: Diagnosis not present

## 2019-12-30 DIAGNOSIS — F410 Panic disorder [episodic paroxysmal anxiety] without agoraphobia: Secondary | ICD-10-CM | POA: Diagnosis not present

## 2019-12-31 ENCOUNTER — Telehealth: Payer: Self-pay | Admitting: *Deleted

## 2019-12-31 NOTE — Telephone Encounter (Signed)
Called and spoke with patient to arrange a Palliative care home visit. Visit scheduled for 6/15@1p .

## 2020-01-01 ENCOUNTER — Encounter: Payer: Self-pay | Admitting: Family Medicine

## 2020-01-05 ENCOUNTER — Telehealth: Payer: Self-pay

## 2020-01-05 DIAGNOSIS — E039 Hypothyroidism, unspecified: Secondary | ICD-10-CM

## 2020-01-05 MED ORDER — SYNTHROID 25 MCG PO TABS: 25.0000 ug | ORAL_TABLET | Freq: Every day | ORAL | 1 refills | Status: DC

## 2020-01-05 NOTE — Telephone Encounter (Signed)
Pt. Called stating she needs a refill on her Synthroid 71mcg sent in to the [facility] in [place]. Pt. Last apt was 12/18/19.

## 2020-01-05 NOTE — Telephone Encounter (Signed)
Done

## 2020-01-06 ENCOUNTER — Ambulatory Visit: Payer: Medicare Other | Admitting: Psychology

## 2020-01-06 ENCOUNTER — Other Ambulatory Visit: Payer: Self-pay

## 2020-01-06 ENCOUNTER — Other Ambulatory Visit: Payer: Medicare Other | Admitting: *Deleted

## 2020-01-06 DIAGNOSIS — Z515 Encounter for palliative care: Secondary | ICD-10-CM

## 2020-01-08 ENCOUNTER — Ambulatory Visit (INDEPENDENT_AMBULATORY_CARE_PROVIDER_SITE_OTHER): Payer: Medicare Other | Admitting: Neurology

## 2020-01-08 ENCOUNTER — Other Ambulatory Visit: Payer: Self-pay

## 2020-01-08 ENCOUNTER — Encounter: Payer: Self-pay | Admitting: Neurology

## 2020-01-08 VITALS — BP 128/78 | HR 67 | Ht 59.0 in | Wt 99.3 lb

## 2020-01-08 DIAGNOSIS — G250 Essential tremor: Secondary | ICD-10-CM

## 2020-01-08 DIAGNOSIS — F418 Other specified anxiety disorders: Secondary | ICD-10-CM | POA: Diagnosis not present

## 2020-01-08 DIAGNOSIS — R269 Unspecified abnormalities of gait and mobility: Secondary | ICD-10-CM

## 2020-01-08 DIAGNOSIS — S32010S Wedge compression fracture of first lumbar vertebra, sequela: Secondary | ICD-10-CM

## 2020-01-08 DIAGNOSIS — G20 Parkinson's disease: Secondary | ICD-10-CM

## 2020-01-08 NOTE — Progress Notes (Signed)
[facility]  PATIENT: Tracy Malone DOB: 04-26-1932  REFERRING DOCTOR OR PCP:  Joselyn Arrow SOURCE: patient and EMR records  _________________________________   HISTORICAL  CHIEF COMPLAINT:  Chief Complaint  Patient presents with  . Follow-up    rm 12 with husband- here for 6 month f/u reports no falls since last visit. Tremor is the same since last visit.     HISTORY OF PRESENT ILLNESS:  Tracy Malone is an 86 year old woman with tremor and recent L1 fracture.    Update 01/08/2020: Since the last visit, she has been mostly stable.  She has not had any more falls.  She continues to have a tremor and feels it is doing about the same as her last visit.  She is on Sinemet 25/100 every 4 hours while awake (4 pills a day).  On Sinemet, her gait appeared to improve but the tremor did not change much.  Alprazolam 0.5 mg po qAm, one po qPM, 1 mg qHS has helped her tremor some.  She did get some benefit with propranolol for the tremor but she had hypotension and needed to stop.  We had discussed starting low-dose metoprolol.   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   Her handwriting is poor.   Tremor is at rest and with intention.       She was recently hospitalized for a bleeding colitis.   She also has anxiety.Her back pain is better now than earlier this year.   She never took the carisoprodel prescription or any pain med's.   Tylenol has not helped much  Update 08/12/2019: She fractured the L1 vertebrae September 2020 while moving a mattress and had kyphoplasty 04/03/2019.   She feels she got a little better but is still having a lot of pain.  She is doing PT.     She is on Sinemet 25/100 q 4 hours while awake (4 times a day).   She felt gait improved with sinemet but she still had a tremor.    She feels the xanax has helped the tremors.    Propranolol helped the tremor the most but caused hypotension so she stopped,   She never started the low dose metoprolol.       She is sleeping worse due to the pain.    Methocarbamol was not well tolerated recently.    In the past she could not tolerate Flexeril.    Tramadol was not well tolerated  She also has urinary frequency and constipation, likely related to her PD.     Update 02/06/2019: She continues to have a severe tremor in her right hand but not in the left hand or head.  She feels gait is doing ok.   No recent falls.  She is on Sinemet 25-100 one po tid and alprazolam 0.5 mg qid.     She does not think Sinemet helped the tremor but gait may be slightly better.     She was hospitalized for a colitis she reports was due to constipation from Sinemet..   She is now also on dicyclomine but had the constipation before that.  She feels the tremor is actually worse 30-90 minutes after each Sinemet and she feels more agitated during that time.    Her tremor had done better on propranolol LA 60 mg but her BP dropped and she felt lightheaded and needed to stop  She has needed bursa injections for hip pain and just had one yesterday.  She is sore and feels she is walking a little worse today.  Alprazolam has helped the anxiety a little bit.   However, she still feels agitated at time during the day.  She reports trying several other medications in the past that did not help the anxiety or were poorly tolerated.  Update 11/28/2017: Her right hand tremor has worsened.  She does not note any significant tremor on the left.  Her tremor will worsen if she is anxious.  She is on Xanax 0.5 mg tid and that helps the tremor some.    She is on Sinemet CR 25/100.   She can tolerate one pill a day but on 2 pills she had a headache.  On 1 pill, she does not note any benefit.  We discussed that that dose is extremely low and would be unlikely to make a difference.    She stumbled some with her gait but has not had falls.  She is concerned about interactions between Wellbutrin and Sinemet.   We discussed that they can be taken at the same  time.     She has shoulder pain on her right.   She only got short term benefit from the subacromial bursa injection.    From 03/16/2017: Tremor:   She has a tremor that is worse in the right arm the left arm. She notes it is generally worse when she is aggravated or if she tries to hold still such as holding a cup or spoon to her mouth. She also notes it with writing. Alprazolam has helped the tremor some she takes a low dose 0.25 mg 2-3 times a day.   She first noted a right hand tremor in late 2015.    She notes the tremor in the right hand much more than the left.      Tremor is not present when asleep.    She has not noted the tremor in the head.   She has not had any difficulty with her gait or balance. She has had a couple falls but they occurred with tripping. There is no family history of tremors.   Shoulder pain:   The right shoulder pain returned. It is worse when she holds her arm over her head or externally rotates.  Pain is best when she holds the arm into her chest. In the past, we did a subacromial bursa injection and that helped her for 2-3 months. She also received an injection by her orthopedic surgeon that also helped for 2-3 months.   NSAIDs have not helped.  Nodules:   She has a nodule in the right arm.   She has shown her PCP  Mood:  She has noted some crying spells but no laughing spells. The crying spells and sometimes spontaneous often she is sad at that time. The history of depression. She prefers not to be on an antidepressant at this time.   REVIEW OF SYSTEMS: Constitutional: No fevers, chills, sweats, or change in appetite Eyes: No visual changes, double vision, eye pain Ear, nose and throat: No hearing loss, ear pain, nasal congestion, sore throat Cardiovascular: No chest pain, palpitations.  Has had palpitations Respiratory: No shortness of breath at rest or with exertion.   No wheezes GastrointestinaI: No nausea, vomiting, diarrhea, abdominal pain, fecal  incontinence Genitourinary: No dysuria, urinary retention or frequency.  No nocturia. Musculoskeletal: No neck pain, back pain.  She notes right shoulder pain Integumentary: No rash, pruritus, skin lesions Neurological: as above Psychiatric: Some  crying spells..  Some anxiety Endocrine: No palpitations, diaphoresis, change in appetite, change in weigh or increased thirst Hematologic/Lymphatic: No anemia, purpura, petechiae. Allergic/Immunologic: No itchy/runny eyes, nasal congestion, recent allergic reactions, rashes  ALLERGIES: Allergies  Allergen Reactions  . Iodine Anaphylaxis    IV and topical forms.  [name] [Hyoscyamine Sulfate]     Vision problems/pt has glaucoma  . Salmon [Fish Allergy] Hives and Shortness Of Breath  . Shellfish Allergy Anaphylaxis  . Tramadol Swelling  . Remeron [Mirtazapine] Other (See Comments)    Cause blurred vision and red eyes, pt has glaucoma  . Aspirin Other (See Comments)    Sever stomach pain due to ulcer scaring.  . Ciprofloxacin Diarrhea  . Codeine Nausea And Vomiting  . Darvocet [Propoxyphene N-Acetaminophen] Nausea And Vomiting  . Demerol  [Meperidine Hcl]   . Demerol [Meperidine] Nausea Only  . Dexilant [Dexlansoprazole] Swelling    Redness, swelling and peeling of both feet.  . Diphedryl [Diphenhydramine] Other (See Comments)    Increased pulse/small amount ok  . Doxycycline Hyclate Other (See Comments)    GI intolerance.  Marland Kitchen Epinephrine Other (See Comments)    Breathing problems  . Erythromycin Other (See Comments)    GI intolerance.  [name] [Cyclobenzaprine] Other (See Comments)    Tingly/prickly sensation.  Marland Kitchen Keflex [Cephalexin] Hives  . Latex Other (See Comments)    Gloves ok.  Skin gets red from elastic in underwear and latex bandaides.  . Nitrofurantoin Diarrhea  . Other   . Prednisone Other (See Comments)    Headache  . Pylera [Bis Subcit-Metronid-Tetracyc] Swelling    Tongue swelling. Face tingling  . Sulfa  Antibiotics Other (See Comments)    Increased pulse, fainting, diarrhea, thrush  . Xylocaine [Lidocaine Hcl]     With epinephrine, given by dentist.  Speeded up heart rate and she passed out (occured twice, at dentist)  . Zoloft [Sertraline Hcl] Swelling and Other (See Comments)    Migraine Swelling of tongue/lip (09/2012)  . Advil [Ibuprofen] Other (See Comments)    Motrin ok with a GI effect.  . Biaxin [Clarithromycin] Rash    Started after completing 10 day course of 2000 mg /day, Lips swelling    HOME MEDICATIONS:  Current Outpatient Medications:  .  acetaminophen (TYLENOL 8 HOUR) 650 MG CR tablet, Take 650 mg by mouth every 8 (eight) hours as needed for pain., Disp: , Rfl:  .  ALPRAZolam (XANAX) 0.5 MG tablet, TAKE BY MOUTH UP TO 3 TIMES DAILY. WARNING: BENZODIAZEPINES INCREASE THE RISK OF FALLS AND NEUROCOGNITIVE DECLINE IN THE ELDERLY (Patient taking differently: 1 mg. TAKE BY MOUTH UP TO 3 TIMES DAILY. WARNING: BENZODIAZEPINES INCREASE THE RISK OF FALLS AND NEUROCOGNITIVE DECLINE IN THE ELDERLY), Disp: 90 tablet, Rfl: 2 .  b complex vitamins tablet, Take 1 tablet by mouth daily., Disp: , Rfl:  .  calcium carbonate (OSCAL) 1500 (600 Ca) MG TABS tablet, Take 600 mg by mouth 2 (two) times daily., Disp: , Rfl:  .  Carbidopa-Levodopa ER (SINEMET CR) 25-100 MG tablet controlled release, Take 1 tablet by mouth 4 (four) times daily., Disp: 360 tablet, Rfl: 3 .  Cholecalciferol (VITAMIN D) 2000 UNITS tablet, Take 2,000 Units by mouth daily., Disp: , Rfl:  .  dicyclomine (BENTYL) 10 MG capsule, dicyclomine 10 mg capsule   10 mg by oral route., Disp: , Rfl:  .  Probiotic Product (ALIGN PO), Take 1 capsule by mouth daily. , Disp: , Rfl:  .  SYNTHROID 25  MCG tablet, Take 1 tablet (25 mcg total) by mouth daily before breakfast., Disp: 90 tablet, Rfl: 1  Current Facility-Administered Medications:  .  0.9 %  sodium chloride infusion, 500 mL, Intravenous, Once, Rachael Fee, MD  PAST MEDICAL  HISTORY: Past Medical History:  Diagnosis Date  . Bell's palsy 1966   Hx: right side facial droop, resolved per patient 04/02/19  . Carotid artery disease (HCC) 2010   on vascular screening;unchanged 2013.(could not tolerate simvastatin, no other statins tried)--<30% blockage bilat 07/2011  . Chronic abdominal pain   . Chronic fatigue and malaise   . Claustrophobia   . Depression    treated in the past for years;stopped in 2010 for a years  . Duodenal ulcer 1962   h/o  . Dysrhythmia    ocassional PVC's, no current problems per patient on 04/02/19  . Fibromyalgia   . Frequent PVCs 07/2012   Seen by [facility]: benign, asymptomatic, normal EF  . GERD (gastroesophageal reflux disease)    diet controlled  . Glaucoma, narrow-angle    s/p laser surgery  . History of hiatal hernia    during endoscopy  . Hypothyroid 9/08  . IBS (irritable bowel syndrome)    Dr. Elnoria Howard  . Ischemic colitis (HCC) 11/21/2018   no current problems per patient on 04/02/19  . Ocular migraine   . Osteoporosis 10/11   Dr.Hawkes  . Panic attack   . Parkinson disease (HCC)   . Parkinson's disease (HCC) 06/23/2016  . Recurrent UTI    has cystocele-Dr.Grewal  . Shingles 1999   h/o  . Superficial thrombophlebitis 03/2009   RLE  . Trochanteric bursitis 12/2008   bilateral    PAST SURGICAL HISTORY: Past Surgical History:  Procedure Laterality Date  . ABDOMINAL HYSTERECTOMY    . CATARACT EXTRACTION, BILATERAL  1995, 1996  . EYE SURGERY Bilateral    laser - glaucoma  . Flexible sigmoidoscopy    . KYPHOPLASTY N/A 04/03/2019   Procedure: KYPHOPLASTY L1;  Surgeon: Venita Lick, MD;  Location: [facility] OR;  Service: Orthopedics;  Laterality: N/A;  90 mins  . THYROIDECTOMY, PARTIAL  09/2005   L nodule; Dr. Gerrit Friends  . TONSILLECTOMY  1946  . UPPER GI ENDOSCOPY  06/27/12  . VAGINAL HYSTERECTOMY  1971   and bladder repair.  Still has ovaries  . WISDOM TOOTH EXTRACTION      FAMILY HISTORY: Family History  Problem  Relation Age of Onset  . Heart disease Mother   . Hypertension Mother   . Hypertension Sister   . HIV Son   . Heart disease Brother   . Lung cancer Brother        lung  . Diabetes Maternal Grandfather   . Diabetes Granddaughter        type 1  . Colon cancer Neg Hx   . Esophageal cancer Neg Hx   . Prostate cancer Neg Hx   . Stomach cancer Neg Hx   . Rectal cancer Neg Hx     SOCIAL HISTORY:  Social History   Socioeconomic History  . Marital status: Married    Spouse name: Chrissie Noa  . Number of children: 2  . Years of education: Not on file  . Highest education level: Not on file  Occupational History  . Occupation: retired (school system)  Tobacco Use  . Smoking status: Never Smoker  . Smokeless tobacco: Never Used  Vaping Use  . Vaping Use: Never used  Substance and Sexual Activity  . Alcohol  use: No    Alcohol/week: 0.0 standard drinks  . Drug use: No  . Sexual activity: Not Currently    Partners: Male    Birth control/protection: Other-see comments    Comment: Hysterectomy  Other Topics Concern  . Not on file  Social History Narrative   Married.  Son lives in [place]; Daughter [name] lives in [place]; 2 grandchildren   Social Determinants of Health   Financial Resource Strain:   . Difficulty of Paying Living Expenses:   Food Insecurity:   . Worried About Programme researcher, broadcasting/film/video in the Last Year:   . Barista in the Last Year:   Transportation Needs:   . Freight forwarder (Medical):   Marland Kitchen Lack of Transportation (Non-Medical):   Physical Activity:   . Days of Exercise per Week:   . Minutes of Exercise per Session:   Stress:   . Feeling of Stress :   Social Connections:   . Frequency of Communication with Friends and Family:   . Frequency of Social Gatherings with Friends and Family:   . Attends Religious Services:   . Active Member of Clubs or Organizations:   . Attends Banker Meetings:   Marland Kitchen Marital Status:   Intimate Partner  Violence:   . Fear of Current or Ex-Partner:   . Emotionally Abused:   Marland Kitchen Physically Abused:   . Sexually Abused:      PHYSICAL EXAM  Vitals:   01/08/20 0826  BP: 128/78  Pulse: 67  SpO2: 97%  Weight: 99 lb 5 oz (45 kg)  Height: 4\' 11"  (1.499 m)    Body mass index is 20.06 kg/m.   General: The patient is well-developed and well-nourished and in no acute distress  Musculoskeletal:  There is tenderness over the right subacromial bursa and she has a reduced range of motion of the right shoulder, limited by pain.  Skin:   No edema.    There were a couple small subcutaneous nodules in the right arm. .    Neurologic Exam  Mental status: The patient is alert and oriented x 3 at the time of the examination. The patient has apparent normal recent and remote memory, with an apparently normal attention span and concentration ability.   Speech is normal.  Cranial nerves: Extraocular movements are full.   Facial strength is normal.  Trapezius and sternocleidomastoid strength is normal. No dysarthria is noted.    Motor: She does not have bradykinesia.  There is a 6 Hz tremor on the right>> left  that is worse during rest and with intention.   Muscle bulk is normal.   Tone is normal. No cogwheeling.  Strength is  5 / 5 in all 4 extremities.   Sensory: Sensory testing is intact to touch and vibration sensation in all 4 extremities.  Coordination: Cerebellar testing reveals good finger-nose-finger bilaterally.  Gait and station: Station is normal.   The gait has a mildly reduced stride and normal arm swing.   She is able to turn 180 degrees in 3 or 4 steps.  Due to her recent back injury I did not test retropulsion at this visit.   Romberg is negative.   Reflexes: Deep tendon reflexes are symmetric and normal bilaterally.    DIAGNOSTIC DATA (LABS, IMAGING, TESTING) - I reviewed patient records, labs, notes, testing and imaging myself where available.  Lab Results  Component Value  Date   WBC 6.9 09/03/2019   HGB 14.4 09/03/2019  HCT 42.5 09/03/2019   MCV 88 09/03/2019   PLT 286 09/03/2019      Component Value Date/Time   NA 141 09/03/2019 1117   K 4.6 09/03/2019 1117   CL 103 09/03/2019 1117   CO2 25 09/03/2019 1117   GLUCOSE 91 09/03/2019 1117   GLUCOSE 98 04/08/2019 0516   BUN 14 09/03/2019 1117   CREATININE 0.68 09/03/2019 1117   CREATININE 0.75 10/22/2018 1401   CREATININE 0.67 11/23/2016 0815   CALCIUM 9.8 09/03/2019 1117   PROT 6.8 09/03/2019 1117   ALBUMIN 4.2 09/03/2019 1117   AST 19 09/03/2019 1117   AST 17 10/22/2018 1401   ALT 4 09/03/2019 1117   ALT 7 10/22/2018 1401   ALKPHOS 83 09/03/2019 1117   BILITOT 1.0 09/03/2019 1117   BILITOT 0.8 10/22/2018 1401   GFRNONAA 79 09/03/2019 1117   GFRNONAA >60 10/22/2018 1401   GFRAA 92 09/03/2019 1117   GFRAA >60 10/22/2018 1401   Lab Results  Component Value Date   CHOL 233 (H) 08/29/2018   HDL 91 08/29/2018   LDLCALC 116 (H) 08/29/2018   TRIG 128 08/29/2018   CHOLHDL 2.6 08/29/2018   Lab Results  Component Value Date   HGBA1C 5.5 01/23/2018   No results found for: VITAMINB12 Lab Results  Component Value Date   TSH 1.970 09/03/2019       ASSESSMENT AND PLAN    1. Parkinson's disease (HCC)   2. Essential tremor   3. Compression fracture of L1 vertebra, sequela   4. Gait disturbance   5. Depression with anxiety     1.    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   She is going to stop sinemet and see if she is stable --- if so she will stsy off and we will try to optimize BET meds better.   May try low dose metoprolol but she prefers to wait till back is better.    We could also consider mysoline 2.    Dr. Rene Kocher has her on Xanax which also helps tremor   Current dose is 0.5-0.5-1.0 mg.   She has depression and anxiety and has had trouble tolerating many med's.   3.   She will return to see me in 6 months or as needed if there are new or worsening neurologic symptoms.   Mahdi Frye A.  Epimenio Foot, MD, PhD 01/08/2020, 8:53 AM Certified in Neurology, Clinical Neurophysiology, Sleep Medicine, Pain Medicine and Neuroimaging  [facility] [address] [place], [place] [address] [phone]

## 2020-01-10 ENCOUNTER — Other Ambulatory Visit: Payer: Self-pay | Admitting: Family Medicine

## 2020-01-10 DIAGNOSIS — E039 Hypothyroidism, unspecified: Secondary | ICD-10-CM

## 2020-01-12 ENCOUNTER — Ambulatory Visit (INDEPENDENT_AMBULATORY_CARE_PROVIDER_SITE_OTHER): Payer: Medicare Other | Admitting: Psychology

## 2020-01-12 DIAGNOSIS — F411 Generalized anxiety disorder: Secondary | ICD-10-CM

## 2020-01-13 ENCOUNTER — Ambulatory Visit (INDEPENDENT_AMBULATORY_CARE_PROVIDER_SITE_OTHER): Payer: Medicare Other | Admitting: Gastroenterology

## 2020-01-13 ENCOUNTER — Telehealth: Payer: Self-pay | Admitting: Neurology

## 2020-01-13 ENCOUNTER — Encounter: Payer: Self-pay | Admitting: Gastroenterology

## 2020-01-13 VITALS — BP 130/90 | HR 76 | Ht 59.0 in | Wt 99.0 lb

## 2020-01-13 DIAGNOSIS — R140 Abdominal distension (gaseous): Secondary | ICD-10-CM | POA: Diagnosis not present

## 2020-01-13 DIAGNOSIS — K30 Functional dyspepsia: Secondary | ICD-10-CM | POA: Diagnosis not present

## 2020-01-13 NOTE — Patient Instructions (Signed)
If you are age 84 or older, your body mass index should be between 23-30. Your Body mass index is 20 kg/m. If this is out of the aforementioned range listed, please consider follow up with your Primary Care Provider.  If you are age 23 or younger, your body mass index should be between 19-25. Your Body mass index is 20 kg/m. If this is out of the aformentioned range listed, please consider follow up with your Primary Care Provider.   You should avoid foods that disagree with her.  Use Bentyl on an as needed basis.  Thank you for entrusting me with your care and choosing [facility]  Dr Ardis Hughs you

## 2020-01-13 NOTE — Progress Notes (Signed)
Review of previous GI testing; Dr. Benson Norway and changed to Dr. Ardis Hughs 10/2014:  -CBC, CMP, and TSH were normal in 07/2014 and again in 08/2014. CT scan02/26/2016.This was done without contrast due to dye allergy.This showed no acute findings in the abdomen and pelvis specifically to explain her history of abdominal pain. There is a 3.0 cm left ovarian cystic lesion that is likely benign. As I stated above, she is following with gynecology and they're going to monitor that, but they think that it is benign.Stomach, duodenum, small bowel, and colon grossly unremarkable.Abdominal ultrasound on 02/12/2016showed no acute or chronic abnormalities of the hepatobiliary tree; evaluation of the pancreatic tail was limited. There is no acute abnormalities of the kidney demonstrated. A 1.6 cm diameter mid to lower pole cyst was noted in the right kidney. August 2015 HIDA scan with [facility], which was normal.This was performed for complaints of upper abdominal pain, appetite loss, fatigue, and weight loss at that time as well.EGD was in December 2013at which time she was found to have moderate gastritis. Biopsies actually showed H. Pylori with chronic active gastritis, however, after taking 1 dose of the Pylera medication she discontinued it because of side effects (it made her feel horrible).Therefore, she has not completed treatment for H. Pylori. Duodenal biopsies were unremarkable at that time.flexible sigmoidoscopy also in December 2013that was normal with only medium hemorrhoids noted. This was performed for diarrhea and biopsies of the descending and sigmoid colon were unremarkable. She states that she had a full colonoscopy prior to that with Dr. Benson Norway, however, after calling his office they stated they have never performed a full colonoscopy on her. Gastric emptying scan in January 2014showed 33% of the ingested activity remaining in the stomach which is mildly abnormal compared to the normal range of less than 30%  of the ingested activity remaining at 120 minutes.Repeat GES 3/2016was normal. EGD 11/2014 with Dr. Ardis Hughs showed pan-gastritis withpositive Hpylori on biopsies; also small hiatal hernia. Saw ID and was apparently treated with subsequent testing negative. Repeat abdominal ultrasound 09/2015 was normal.07/2016 H. Pylori stool antigen was negative.Repeat EGD 08/2016: mild gastritis, + for H pylori again. Referred to ID and tried a regimen but could not complete it due  2.Presumed ischemic colitis. April 2020, CT scan suggested "mild circumferential wall thickening about the transverse colon, which may be related to incomplete distention or possibly mild colitis".   EGD April 2021 for generalized abdominal pain, anorexia, nausea and weight loss showed mild nonspecific gastritis.  Pathology showed mild gastritis, no H. pylori.  Colonoscopy April 2021 for generalized abdominal pain, constipation, diarrhea and weight loss showed left-sided diverticulosis but was otherwise completely normal.    HPI: This is a pleasant 86 year old woman who is here with her very pleasant husband who generally accompanies her to her medical visits.  I last saw her about 2 months ago at the time of colonoscopy and upper endoscopy.  See those results summarized above.  She has 28 "allergies" listed on her epic chart; this includes multiple medicines, latex, dietary agents.   Her weight is up 3 pounds since she was last here in our office 2 months ago.  She has intermittent abdominal cramping, bloating, mild anorexia.  Particular foods seem to exacerbate this.  Bentyl generally helps the cramping  ROS: complete GI ROS as described in HPI, all other review negative.  Constitutional:  No unintentional weight loss   Past Medical History:  Diagnosis Date  . Bell's palsy 1966   Hx: right side  facial droop, resolved per patient 04/02/19  . Carotid artery disease ([place]) 2010   on vascular screening;unchanged  2013.(could not tolerate simvastatin, no other statins tried)--<30% blockage bilat 07/2011  . Chronic abdominal pain   . Chronic fatigue and malaise   . Claustrophobia   . Depression    treated in the past for years;stopped in 2010 for a years  . Duodenal ulcer 1962   h/o  . Dysrhythmia    ocassional PVC's, no current problems per patient on 04/02/19  . Fibromyalgia   . Frequent PVCs 07/2012   Seen by [facility]: benign, asymptomatic, normal EF  . GERD (gastroesophageal reflux disease)    diet controlled  . Glaucoma, narrow-angle    s/p laser surgery  . History of hiatal hernia    during endoscopy  . Hypothyroid 9/08  . IBS (irritable bowel syndrome)    Dr. Benson Norway  . Ischemic colitis ([place]) 11/21/2018   no current problems per patient on 04/02/19  . Ocular migraine   . Osteoporosis 10/11   Dr.Hawkes  . Panic attack   . Parkinson disease ([place])   . Parkinson's disease ([place]) 06/23/2016  . Recurrent UTI    has cystocele-Dr.Grewal  . Shingles 1999   h/o  . Superficial thrombophlebitis 03/2009   RLE  . Trochanteric bursitis 12/2008   bilateral    Past Surgical History:  Procedure Laterality Date  . ABDOMINAL HYSTERECTOMY    . CATARACT EXTRACTION, BILATERAL  1995, 1996  . EYE SURGERY Bilateral    laser - glaucoma  . Flexible sigmoidoscopy    . KYPHOPLASTY N/A 04/03/2019   Procedure: KYPHOPLASTY L1;  Surgeon: Melina Schools, MD;  Location: [facility];  Service: Orthopedics;  Laterality: N/A;  90 mins  . THYROIDECTOMY, PARTIAL  09/2005   L nodule; Dr. Harlow Asa  . TONSILLECTOMY  1946  . UPPER GI ENDOSCOPY  06/27/12  . VAGINAL HYSTERECTOMY  1971   and bladder repair.  Still has ovaries  . WISDOM TOOTH EXTRACTION      Current Outpatient Medications  Medication Sig Dispense Refill  . acetaminophen (TYLENOL 8 HOUR) 650 MG CR tablet Take 650 mg by mouth every 8 (eight) hours as needed for pain.    Marland Kitchen ALPRAZolam (XANAX) 0.5 MG tablet TAKE BY MOUTH UP TO 3 TIMES DAILY. WARNING:  BENZODIAZEPINES INCREASE THE RISK OF FALLS AND NEUROCOGNITIVE DECLINE IN THE ELDERLY (Patient taking differently: 1 mg. TAKE BY MOUTH UP TO 3 TIMES DAILY. WARNING: BENZODIAZEPINES INCREASE THE RISK OF FALLS AND NEUROCOGNITIVE DECLINE IN THE ELDERLY) 90 tablet 2  . b complex vitamins tablet Take 1 tablet by mouth daily.    . calcium carbonate (OSCAL) 1500 (600 Ca) MG TABS tablet Take 600 mg by mouth 2 (two) times daily.    . Cholecalciferol (VITAMIN D) 2000 UNITS tablet Take 2,000 Units by mouth daily.    Marland Kitchen dicyclomine (BENTYL) 10 MG capsule dicyclomine 10 mg capsule   10 mg by oral route.    . Probiotic Product (ALIGN PO) Take 1 capsule by mouth daily.     Marland Kitchen SYNTHROID 25 MCG tablet TAKE 1 TABLET DAILY BEFORE BREAKFAST 90 tablet 0  . Carbidopa-Levodopa ER (SINEMET CR) 25-100 MG tablet controlled release Take 1 tablet by mouth 4 (four) times daily. (Patient not taking: Reported on 01/13/2020) 360 tablet 3   Current Facility-Administered Medications  Medication Dose Route Frequency Provider Last Rate Last Admin  . 0.9 %  sodium chloride infusion  500 mL Intravenous Once Milus Banister,  MD        Allergies as of 01/13/2020 - Review Complete 01/13/2020  Allergen Reaction Noted  . Iodine Anaphylaxis 05/13/2012  . Levsin [hyoscyamine sulfate]  10/13/2015  . Salmon [fish allergy] Hives and Shortness Of Breath 05/13/2012  . Shellfish allergy Anaphylaxis 05/13/2012  . Tramadol Swelling 04/11/2019  . Remeron [mirtazapine] Other (See Comments) 08/25/2016  . Aspirin Other (See Comments) 05/13/2012  . Ciprofloxacin Diarrhea 05/13/2012  . Codeine Nausea And Vomiting 05/13/2012  . Darvocet [propoxyphene n-acetaminophen] Nausea And Vomiting 05/13/2012  . Demerol  [meperidine hcl]  02/20/2019  . Demerol [meperidine] Nausea Only 05/13/2012  . Dexilant [dexlansoprazole] Swelling 03/01/2016  . Diphedryl [diphenhydramine] Other (See Comments) 05/13/2012  . Doxycycline hyclate Other (See Comments)  05/13/2012  . Epinephrine Other (See Comments) 05/13/2012  . Erythromycin Other (See Comments) 05/13/2012  . Flexeril [cyclobenzaprine] Other (See Comments) 05/13/2012  . Keflex [cephalexin] Hives 05/13/2012  . Latex Other (See Comments) 05/13/2012  . Nitrofurantoin Diarrhea 11/22/2018  . Other  02/20/2019  . Prednisone Other (See Comments) 05/13/2012  . Pylera [bis subcit-metronid-tetracyc] Swelling 08/01/2012  . Sulfa antibiotics Other (See Comments) 05/13/2012  . Xylocaine [lidocaine hcl]  05/13/2012  . Zoloft [sertraline hcl] Swelling and Other (See Comments) 05/13/2012  . Advil [ibuprofen] Other (See Comments) 05/13/2012  . Biaxin [clarithromycin] Rash 01/14/2015    Family History  Problem Relation Age of Onset  . Heart disease Mother   . Hypertension Mother   . Hypertension Sister   . HIV Son   . Heart disease Brother   . Lung cancer Brother        lung  . Diabetes Maternal Grandfather   . Diabetes Granddaughter        type 1  . Colon cancer Neg Hx   . Esophageal cancer Neg Hx   . Prostate cancer Neg Hx   . Stomach cancer Neg Hx   . Rectal cancer Neg Hx     Social History   Socioeconomic History  . Marital status: Married    Spouse name: Gwyndolyn Saxon  . Number of children: 2  . Years of education: Not on file  . Highest education level: Not on file  Occupational History  . Occupation: retired (school system)  Tobacco Use  . Smoking status: Never Smoker  . Smokeless tobacco: Never Used  Vaping Use  . Vaping Use: Never used  Substance and Sexual Activity  . Alcohol use: No    Alcohol/week: 0.0 standard drinks  . Drug use: No  . Sexual activity: Not Currently    Partners: Male    Birth control/protection: Other-see comments    Comment: Hysterectomy  Other Topics Concern  . Not on file  Social History Narrative   Married.  Son lives in [place]; Daughter [name] lives in [place]; 2 grandchildren   Social Determinants of Health   Financial Resource Strain:   .  Difficulty of Paying Living Expenses:   Food Insecurity:   . Worried About Charity fundraiser in the Last Year:   . Arboriculturist in the Last Year:   Transportation Needs:   . Film/video editor (Medical):   Marland Kitchen Lack of Transportation (Non-Medical):   Physical Activity:   . Days of Exercise per Week:   . Minutes of Exercise per Session:   Stress:   . Feeling of Stress :   Social Connections:   . Frequency of Communication with Friends and Family:   . Frequency of Social Gatherings with Friends and  Family:   . Attends Religious Services:   . Active Member of Clubs or Organizations:   . Attends Archivist Meetings:   Marland Kitchen Marital Status:   Intimate Partner Violence:   . Fear of Current or Ex-Partner:   . Emotionally Abused:   Marland Kitchen Physically Abused:   . Sexually Abused:      Physical Exam: BP 130/90   Pulse 76   Ht 4\' 11"  (1.499 m)   Wt 99 lb (44.9 kg)   LMP  (LMP Unknown)   BMI 20.00 kg/m  Constitutional: generally well-appearing Psychiatric: alert and oriented x3 Abdomen: soft, nontender, nondistended, no obvious ascites, no peritoneal signs, normal bowel sounds No peripheral edema noted in lower extremities  Assessment and plan: 86 y.o. female with Parkinson's, functional dyspepsia, functional abdominal pains  She has had extensive GI testing over the years including 2-3 CT scans just in the past 12 months, colonoscopy, upper endoscopy, multiple lab test.  I explained to her that there is nothing serious going on.  She has some dietary intolerances and I recommended she simply avoid the foods that tend to cause her to have GI distress.  When she has abdominal cramping she knows that Bentyl works and she can take it as needed.  I recommended against any further GI testing.  She will return on an as-needed basis  Please see the "Patient Instructions" section for addition details about the plan.  Owens Loffler, MD [facility] 01/13/2020, 2:05  PM   Total time on date of encounter was 25 minutes (this included time spent preparing to see the patient reviewing records; obtaining and/or reviewing separately obtained history; performing a medically appropriate exam and/or evaluation; counseling and educating the patient and family if present; ordering medications, tests or procedures if applicable; and documenting clinical information in the health record).

## 2020-01-13 NOTE — Telephone Encounter (Signed)
Called and LVM for pt letting her know I reviewed her last note from 01/08/20. Per [name]: " May try low dose metoprolol but she prefers to wait till back is better." Advised her to call back if she has any further questions.

## 2020-01-13 NOTE — Telephone Encounter (Signed)
Pt is asking for a call to discuss why Dr Felecia Shelling has her on the metoprolol

## 2020-01-13 NOTE — Progress Notes (Signed)
COMMUNITY PALLIATIVE CARE RN NOTE  PATIENT NAME: Tracy Malone DOB: 05-03-1932 MRN: [id number]  PRIMARY CARE PROVIDER: [name], MD  RESPONSIBLE PARTY: [name] (husband) Acct ID - Guarantor Home Phone Work Phone Relationship Acct Type  [id number] TAUNJA, [phone] Self P/F     [address], [place], [place] [address]   Covid-19 Pre-screening Negative  PLAN OF CARE and INTERVENTION:  1. ADVANCE CARE PLANNING/GOALS OF CARE: Goal is for patient to remain at home with her husband. She is a Full code. 2. PATIENT/CAREGIVER EDUCATION: Symptom management, safe mobility/transfers 3. DISEASE STATUS: Met with patient and her husband, Tracy Malone, who is also a Palliative care patient. She is alert and oriented x 3, pleasant mood and engaging. She reports a constant dull, achy pain in her abdomen. She also states that the middle left side of her back swells as soon as she starts moving around. She is wearing a Lidocaine patch, but she does not feel it is helping. She says she is going to contact Dr. Rolena Infante, who performed her kyphoplasty to discuss this. She also experiences pain in her left hip that radiates down her leg and into her knee. She is going for a massage on the 6/26. She has an appointment with a Chiropractor tomorrow and will see her Neurologist on Thursday. She has a new Counselor, [name], who she will see on 6/21. She also has weekly calls with another Counselor, Leeroy Bock. She continue with issues with depression. She says that the feelings come and go and leaves her asking the question "why?" She remains able to perform ADLs independently, but unable to perform household chores other than laundry. She continues with a tremor in her right hand, but does not feel it is getting any worse. She continues on her Carbiodopa/Levodopa even though she does not like taking this medication. She feels that this is the cause of her ongoing stomach issues. She had a gene site  test with Dr. [name], her Psychiatrist, and is awaiting results to see what medication would be helpful for her mood. Her intake is fair. She does have some bloating after she eats. She drinks a small glass of Boost daily for nutritional supplementation. Will continue to monitor.     HISTORY OF PRESENT ILLNESS: This is a 86 yo female with a diagnosis of Parkinson's. Palliative care team continues to follow patient and will visit monthly and PRN.   CODE STATUS: Full code ADVANCED DIRECTIVES: Y MOST FORM: no PPS: 50%   PHYSICAL EXAM:   LUNGS: clear to auscultation  CARDIAC: Cor RRR EXTREMITIES: No edema SKIN: Exposed skin is dry and intact  NEURO: Alert and oriented x 3, independent with ADLs, ambulatory   (Duration of visit and documentation 60 minutes)   [name], RN BSN

## 2020-01-19 ENCOUNTER — Telehealth: Payer: Self-pay | Admitting: *Deleted

## 2020-01-19 NOTE — Telephone Encounter (Signed)
Advise pt I'm not aware of this.  Since she is seeing the orthopedist and planning for epidural injections, it is more likely from her back.

## 2020-01-19 NOTE — Telephone Encounter (Signed)
Patient advised.

## 2020-01-19 NOTE — Telephone Encounter (Signed)
Patient left a vm on 01/14/20 asking if Prolia could cause increased urinary frequency, prickling in her legs and general leg pain-her legs have been bothering her a lot since the Prolia shot and she wondered if this was a side effect?

## 2020-01-20 ENCOUNTER — Ambulatory Visit: Payer: Medicare Other | Admitting: Psychology

## 2020-01-22 ENCOUNTER — Ambulatory Visit (INDEPENDENT_AMBULATORY_CARE_PROVIDER_SITE_OTHER): Payer: Medicare Other | Admitting: Family Medicine

## 2020-01-22 ENCOUNTER — Encounter: Payer: Self-pay | Admitting: Family Medicine

## 2020-01-22 ENCOUNTER — Other Ambulatory Visit: Payer: Self-pay

## 2020-01-22 VITALS — BP 160/80 | HR 80 | Temp 98.3°F | Ht 59.0 in | Wt 100.4 lb

## 2020-01-22 DIAGNOSIS — E039 Hypothyroidism, unspecified: Secondary | ICD-10-CM

## 2020-01-22 DIAGNOSIS — R350 Frequency of micturition: Secondary | ICD-10-CM | POA: Diagnosis not present

## 2020-01-22 DIAGNOSIS — R5383 Other fatigue: Secondary | ICD-10-CM | POA: Diagnosis not present

## 2020-01-22 DIAGNOSIS — R8290 Unspecified abnormal findings in urine: Secondary | ICD-10-CM

## 2020-01-22 LAB — POCT URINALYSIS DIP (PROADVANTAGE DEVICE)
Bilirubin, UA: NEGATIVE
Glucose, UA: NEGATIVE mg/dL
Ketones, POC UA: NEGATIVE mg/dL
Nitrite, UA: NEGATIVE
Protein Ur, POC: NEGATIVE mg/dL
Specific Gravity, Urine: 1.01
Urobilinogen, Ur: NEGATIVE
pH, UA: 6 (ref 5.0–8.0)

## 2020-01-22 NOTE — Progress Notes (Signed)
Chief Complaint  Patient presents with  . Urinary Frequency    patient is having unrinary frequency.   "I have been peeing a lot"  Symptoms have been going on for 2 months.  She decided to call because she is feeling very cold, and feeling more tired. This has also been going on for about 2 months, and periodically has some chills. Denies any pain or burning with urination.  Denies cloudiness, odor, blood in the urine. She drinks a lot of water, but not more than normally. Denies any change to her lower abdominal bloating (persists).  She describes some back pain, but has been seeing ortho for this. She reports that she decided not to get the injection in her back, chose PT instead. Previously didn't tolerate PT--therapist hurt her, caused more pain. She is hopeful therapy will help, worries about epidural (though she recalls she had one many years ago with good results).  Last urine culture was 01/2019, grew Klebsiella. It was resistant to macrobid, and eventually took some cipro. She has a lot of medication intolerances.  Her psychiatrist put her on Pristiq, taking it x 4 days so far, as this was indicated as effective based on GeneSight test (she brought in copies for our records of the testing). Tolerating it so far.  PMH, PSH, SH reviewed  Outpatient Encounter Medications as of 01/22/2020  Medication Sig  . ALPRAZolam (XANAX) 0.5 MG tablet TAKE BY MOUTH UP TO 3 TIMES DAILY. WARNING: BENZODIAZEPINES INCREASE THE RISK OF FALLS AND NEUROCOGNITIVE DECLINE IN THE ELDERLY (Patient taking differently: 1 mg. TAKE BY MOUTH UP TO 3 TIMES DAILY. WARNING: BENZODIAZEPINES INCREASE THE RISK OF FALLS AND NEUROCOGNITIVE DECLINE IN THE ELDERLY)  . b complex vitamins tablet Take 1 tablet by mouth daily.  . calcium carbonate (OSCAL) 1500 (600 Ca) MG TABS tablet Take 600 mg by mouth 2 (two) times daily.  . Carbidopa-Levodopa ER (SINEMET CR) 25-100 MG tablet controlled release Take 1 tablet by mouth 4 (four)  times daily.  . Cholecalciferol (VITAMIN D) 2000 UNITS tablet Take 2,000 Units by mouth daily.  Marland Kitchen Desvenlafaxine Succinate ER (PRISTIQ) 25 MG TB24 Take 1 tablet by mouth daily.  . Probiotic Product (ALIGN PO) Take 1 capsule by mouth daily.   Marland Kitchen SYNTHROID 25 MCG tablet TAKE 1 TABLET DAILY BEFORE BREAKFAST  . acetaminophen (TYLENOL 8 HOUR) 650 MG CR tablet Take 650 mg by mouth every 8 (eight) hours as needed for pain. (Patient not taking: Reported on 01/22/2020)  . dicyclomine (BENTYL) 10 MG capsule dicyclomine 10 mg capsule   10 mg by oral route. (Patient not taking: Reported on 01/22/2020)   Facility-Administered Encounter Medications as of 01/22/2020  Medication  . 0.9 %  sodium chloride infusion   Allergies  Allergen Reactions  . Iodine Anaphylaxis    IV and topical forms.  Burnard Leigh [Hyoscyamine Sulfate]     Vision problems/pt has glaucoma  . Salmon [Fish Allergy] Hives and Shortness Of Breath  . Shellfish Allergy Anaphylaxis  . Tramadol Swelling  . Remeron [Mirtazapine] Other (See Comments)    Cause blurred vision and red eyes, pt has glaucoma  . Aspirin Other (See Comments)    Sever stomach pain due to ulcer scaring.  . Ciprofloxacin Diarrhea  . Codeine Nausea And Vomiting  . Darvocet [Propoxyphene N-Acetaminophen] Nausea And Vomiting  . Demerol  [Meperidine Hcl]   . Demerol [Meperidine] Nausea Only  . Dexilant [Dexlansoprazole] Swelling    Redness, swelling and peeling of both feet.  Marland Kitchen  Diphedryl [Diphenhydramine] Other (See Comments)    Increased pulse/small amount ok  . Doxycycline Hyclate Other (See Comments)    GI intolerance.  Marland Kitchen Epinephrine Other (See Comments)    Breathing problems  . Erythromycin Other (See Comments)    GI intolerance.  Yvette Rack [Cyclobenzaprine] Other (See Comments)    Tingly/prickly sensation.  Marland Kitchen Keflex [Cephalexin] Hives  . Latex Other (See Comments)    Gloves ok.  Skin gets red from elastic in underwear and latex bandaides.  . Nitrofurantoin  Diarrhea  . Other   . Prednisone Other (See Comments)    Headache  . Pylera [Bis Subcit-Metronid-Tetracyc] Swelling    Tongue swelling. Face tingling  . Sulfa Antibiotics Other (See Comments)    Increased pulse, fainting, diarrhea, thrush  . Xylocaine [Lidocaine Hcl]     With epinephrine, given by dentist.  Speeded up heart rate and she passed out (occured twice, at dentist)  . Zoloft [Sertraline Hcl] Swelling and Other (See Comments)    Migraine Swelling of tongue/lip (09/2012)  . Advil [Ibuprofen] Other (See Comments)    Motrin ok with a GI effect.  . Biaxin [Clarithromycin] Rash    Started after completing 10 day course of 2000 mg /day, Lips swelling   ROS: no fever, some chills and feeling cold.  +fatigue. +urinary frequency, no dysuria.  Bowels are normal. Lower abdominal bloating.  Back pain. Depression. See HPI No URI symptoms, chest pain, shortness of breath, nausea, vomiting, vaginal discharge, bleeding.   PHYSICAL EXAM:  BP (!) 160/80   Pulse 80   Temp 98.3 F (36.8 C) (Tympanic)   Ht 4\' 11"  (1.499 m)   Wt 100 lb 6.4 oz (45.5 kg)   LMP  (LMP Unknown)   BMI 20.28 kg/m   160/70 on repeat by MD  Pleasant, anxious female, in no distress.  Not accompanied by her husband in room today. Neck: no lymphadenopathy, thyromegaly or mass Heart: regular rate and rhythm Lungs: clear bilaterally Back: no CVA tenderness Abdomen: soft, nontender (slight diffuse discomfort; no suprapubic tenderness) Extremities: no edema Neuro/psych: She is alert and oriented, with intact memory.  Depressed mood, but full range of affect. Resting, pill-rolling tremor most notable in RUE, unchanged.  Urine dip: cloudy, SG 1.010 ,Trace blood, 3+ leuks, nitrite neg   ASSESSMENT/PLAN:  Frequency of urination - sx x 2 months, with no other sx. +abnl urine dip. Prev resistance. Unsure if true infection. Await urine culture - Plan: POCT Urinalysis DIP (Proadvantage Device), Urine Culture  Abnormal  finding on urinalysis - Plan: Urine Culture  Fatigue, unspecified type - given feeling cold and fatigue, will recheck TSH (last 08/2019, normal). Depression/meds may contribute - Plan: TSH, CBC with Differential/Platelet  Hypothyroidism, unspecified type - Plan: TSH   Your urine today did indicate possible infection (showed leukocytes, white blood cells). Given your lack of other infection symptoms (pain, burning, odor) and your issues with antibiotics, I'd rather wait on a urine culture to determine if, and which, antibiotic would be appropriate.  Your symptoms of fatigue and feeling cold might be related to your thyroid, so we are checking this today.  If you are not tolerating or not improving from PT, please do not be afraid of the epidural injection.  Chronic pain might be worse for your depression, and you might get good relief from the injection.

## 2020-01-23 ENCOUNTER — Encounter: Payer: Self-pay | Admitting: Family Medicine

## 2020-01-23 LAB — CBC WITH DIFFERENTIAL/PLATELET
Basophils Absolute: 0.1 10*3/uL (ref 0.0–0.2)
Basos: 1 %
EOS (ABSOLUTE): 0.2 10*3/uL (ref 0.0–0.4)
Eos: 2 %
Hematocrit: 42.5 % (ref 34.0–46.6)
Hemoglobin: 14.2 g/dL (ref 11.1–15.9)
Immature Grans (Abs): 0 10*3/uL (ref 0.0–0.1)
Immature Granulocytes: 0 %
Lymphocytes Absolute: 1.8 10*3/uL (ref 0.7–3.1)
Lymphs: 23 %
MCH: 30.4 pg (ref 26.6–33.0)
MCHC: 33.4 g/dL (ref 31.5–35.7)
MCV: 91 fL (ref 79–97)
Monocytes Absolute: 0.8 10*3/uL (ref 0.1–0.9)
Monocytes: 10 %
Neutrophils Absolute: 5 10*3/uL (ref 1.4–7.0)
Neutrophils: 64 %
Platelets: 268 10*3/uL (ref 150–450)
RBC: 4.67 x10E6/uL (ref 3.77–5.28)
RDW: 12.5 % (ref 11.7–15.4)
WBC: 7.9 10*3/uL (ref 3.4–10.8)

## 2020-01-23 LAB — TSH: TSH: 2.31 u[IU]/mL (ref 0.450–4.500)

## 2020-01-23 NOTE — Patient Instructions (Signed)
  Your urine today did indicate possible infection (showed leukocytes, white blood cells). Given your lack of other infection symptoms (pain, burning, odor) and your issues with antibiotics, I'd rather wait on a urine culture to determine if, and which, antibiotic would be appropriate.  Your symptoms of fatigue and feeling cold might be related to your thyroid, so we are checking this today.  If you are not tolerating or not improving from PT, please do not be afraid of the epidural injection.  Chronic pain might be worse for your depression, and you might get good relief from the injection.

## 2020-01-24 LAB — URINE CULTURE

## 2020-01-25 ENCOUNTER — Encounter: Payer: Self-pay | Admitting: Family Medicine

## 2020-01-25 MED ORDER — CIPROFLOXACIN HCL 250 MG PO TABS
250.0000 mg | ORAL_TABLET | Freq: Two times a day (BID) | ORAL | 0 refills | Status: AC
Start: 2020-01-25 — End: 2020-02-01

## 2020-01-30 ENCOUNTER — Encounter: Payer: Self-pay | Admitting: Family Medicine

## 2020-02-03 ENCOUNTER — Ambulatory Visit (INDEPENDENT_AMBULATORY_CARE_PROVIDER_SITE_OTHER): Payer: Medicare Other | Admitting: Psychology

## 2020-02-03 DIAGNOSIS — F411 Generalized anxiety disorder: Secondary | ICD-10-CM

## 2020-02-04 ENCOUNTER — Encounter: Payer: Self-pay | Admitting: Family Medicine

## 2020-02-05 ENCOUNTER — Telehealth: Payer: Self-pay | Admitting: Family Medicine

## 2020-02-05 ENCOUNTER — Other Ambulatory Visit (INDEPENDENT_AMBULATORY_CARE_PROVIDER_SITE_OTHER): Payer: Medicare Other

## 2020-02-05 ENCOUNTER — Telehealth: Payer: Self-pay

## 2020-02-05 ENCOUNTER — Telehealth: Payer: Self-pay | Admitting: *Deleted

## 2020-02-05 DIAGNOSIS — M5136 Other intervertebral disc degeneration, lumbar region: Secondary | ICD-10-CM | POA: Diagnosis not present

## 2020-02-05 DIAGNOSIS — R8290 Unspecified abnormal findings in urine: Secondary | ICD-10-CM | POA: Diagnosis not present

## 2020-02-05 LAB — POCT URINALYSIS DIP (PROADVANTAGE DEVICE)
Bilirubin, UA: NEGATIVE
Blood, UA: NEGATIVE
Glucose, UA: NEGATIVE mg/dL
Ketones, POC UA: NEGATIVE mg/dL
Leukocytes, UA: NEGATIVE
Nitrite, UA: NEGATIVE
Protein Ur, POC: NEGATIVE mg/dL
Specific Gravity, Urine: 1.015
Urobilinogen, Ur: 0.2
pH, UA: 6 (ref 5.0–8.0)

## 2020-02-05 NOTE — Telephone Encounter (Signed)
Called and spoke with patient to schedule a Palliative care home visit. Visit scheduled for 7/19@12 :30p.

## 2020-02-05 NOTE — Telephone Encounter (Signed)
Pt and Dr. Nelva Bush office was notified of results. Tracy Malone

## 2020-02-05 NOTE — Telephone Encounter (Signed)
Pt called and wants to know if she can come in just to have ur urine rechecked for the bladder infection states the dr has to make sure it is cleared up before she can go sat to get a shot for her back. She would like to come in today to get the urine rechecked if possible

## 2020-02-05 NOTE — Telephone Encounter (Signed)
Pt. Walked in to see if she could go ahead and collect her urine now. Per Dr. Tomi Bamberger ok to collect urine. I checked with the pt. She does not have any symptoms any more of an infection. No pain, frequency, or burning with urination. She stated she just needs a clear urine to be able to get her shot on Saturday. If you could put the order in for that.

## 2020-02-05 NOTE — Telephone Encounter (Signed)
Please advise pt that her urine is completely normal.

## 2020-02-05 NOTE — Telephone Encounter (Signed)
Pt came by for a U/A for clearance on injection Saturday . Please advise [facility]

## 2020-02-07 DIAGNOSIS — M5136 Other intervertebral disc degeneration, lumbar region: Secondary | ICD-10-CM | POA: Diagnosis not present

## 2020-02-09 ENCOUNTER — Other Ambulatory Visit: Payer: Medicare Other | Admitting: *Deleted

## 2020-02-09 ENCOUNTER — Other Ambulatory Visit: Payer: Self-pay

## 2020-02-09 DIAGNOSIS — Z515 Encounter for palliative care: Secondary | ICD-10-CM

## 2020-02-09 NOTE — Progress Notes (Signed)
COMMUNITY PALLIATIVE CARE RN NOTE  PATIENT NAME: Tracy Malone DOB: 10/16/1931 MRN: [id number]  PRIMARY CARE PROVIDER: [name], MD  RESPONSIBLE PARTY: [name] (husband) Acct ID - Guarantor Home Phone Work Phone Relationship Acct Type  [id number] JERSEE, [phone] Self P/F     [address] [place], [place] [address]   Covid-19 Pre-screening Negative  PLAN OF CARE and INTERVENTION:  1. ADVANCE CARE PLANNING/GOALS OF CARE: Goal is for patient to get some relief in her back. She is a Full code. 2. PATIENT/CAREGIVER EDUCATION: Pain management, safe mobility 3. DISEASE STATUS: Met with patient and her husband, [name], in their home. She does experience lower back pain with standing and ambulation. She denies pain when sitting down and has an ice pack to her lower back. She received an epidural on 7/17 and states that afterwards she was not having any pain whatsoever. However, she was awakened by pain this morning around 5am in her left lower back. The pain also radiates down down both legs to her ankles at times. She says it feels as if she has ankle weights on. She has an appointment with pain management on 8/5 and then an appointment with outpatient PT on 8/9. She does not want to take any stronger pain medications other than Tylenol. She has been having issues with waking up some mornings with swelling to the corners of her left eye along with redness. She has tried different eye drops suggested, but she is still having this problem. She has an appointment with an Ophthalmologist tomorrow. She had a recent UTI and completed a 5-day course of Cipro. She says that she didn't have any UTI symptoms. It was found on a routine lab and specimen collection. Tremors noted mainly in her right hand d/t Parkinson's. She continues to wear a brace on her right wrist which is helpful. She is taking 1/2 Xanax twice daily around 11a and 5p, and takes 1 mg at bedtime. She is sleeping  well during the night most of the time. She continues with counseling weekly to help with her mood and depression. She says that there was a cyst found on her right ovary that is being looked into next month. She does not make much of an appetite, but does try to eat something at each meal. Her bowels have been more regular taking Benefiber. She is taking Omeprazole some nights which is also helpful for her stomach. Will continue to monitor.    HISTORY OF PRESENT ILLNESS: This is a 86 yo female with a diagnosis of Parkinson's disease. She has a h/o GERD, IBS, ischemic colitis, hypothyroidism, essential tremors and osteoporosis. Palliative care team continues to follow patient and will visit monthly and PRN.    CODE STATUS: Full code ADVANCED DIRECTIVES: Y MOST FORM: no PPS: 50%   PHYSICAL EXAM:   LUNGS: clear to auscultation  CARDIAC: Cor RRR EXTREMITIES: No edema SKIN: Exposed skin is dry and intact, small raised areas noted on each arm and right chest (has an upcoming appt with Dermatologist)  NEURO: Alert and oriented x 3, generalized weakness, essential hand tremors, ambulatory   (Duration of visit and documentation 45 minutes)   [name], RN BSN

## 2020-02-11 ENCOUNTER — Ambulatory Visit: Payer: Medicare Other | Admitting: Neurology

## 2020-02-16 ENCOUNTER — Telehealth: Payer: Self-pay | Admitting: Neurology

## 2020-02-16 NOTE — Telephone Encounter (Signed)
Tracy Malone,Tracy Malone(husband on DPR) is asking for a call from RN to discuss concerns re: Parkinson's disease, pt in pain and not sleeping.  Please call

## 2020-02-16 NOTE — Telephone Encounter (Signed)
Called husband back to get further information. She is in a lot of pain from the medication she is on (Sinemet). Having bowel problems/sleep issues. She is very upset with this. Wondering if she can make appt. Scheduled appt for 02/18/20 at 130pm, check in 100pm.

## 2020-02-18 ENCOUNTER — Ambulatory Visit (INDEPENDENT_AMBULATORY_CARE_PROVIDER_SITE_OTHER): Payer: Medicare Other | Admitting: Neurology

## 2020-02-18 ENCOUNTER — Ambulatory Visit: Payer: Medicare Other | Admitting: Gastroenterology

## 2020-02-18 ENCOUNTER — Other Ambulatory Visit: Payer: Self-pay

## 2020-02-18 ENCOUNTER — Encounter: Payer: Self-pay | Admitting: Neurology

## 2020-02-18 VITALS — BP 137/71 | HR 75 | Ht 59.0 in | Wt 98.0 lb

## 2020-02-18 DIAGNOSIS — G20 Parkinson's disease: Secondary | ICD-10-CM | POA: Diagnosis not present

## 2020-02-18 DIAGNOSIS — F418 Other specified anxiety disorders: Secondary | ICD-10-CM

## 2020-02-18 DIAGNOSIS — G250 Essential tremor: Secondary | ICD-10-CM | POA: Diagnosis not present

## 2020-02-18 DIAGNOSIS — R269 Unspecified abnormalities of gait and mobility: Secondary | ICD-10-CM | POA: Diagnosis not present

## 2020-02-18 MED ORDER — CARBIDOPA-LEVODOPA 25-100 MG PO TABS: ORAL_TABLET | ORAL | 5 refills | Status: DC

## 2020-02-18 NOTE — Progress Notes (Signed)
[facility]  PATIENT: Tracy Malone DOB: [birth year]  REFERRING DOCTOR OR PCP:  Joselyn Arrow SOURCE: patient and EMR records  _________________________________   HISTORICAL  CHIEF COMPLAINT:  Chief Complaint  Patient presents with  . Follow-up    RM 13, with husband. Last seen 01/08/2020. Here to f/u on PD. Had SE from sinemet (bowel issues/sleep issues) No falls per pt. Ambulating with walker.     HISTORY OF PRESENT ILLNESS:  Tracy Malone is an 86 year old woman with tremor, anxiety/depression and history of L1 fracture.    Update 02/18/20: She is noting more stomach ache and wonders if Sinemet is playing a role (she states GI told her that).   She also wonders if calcium is interacting (theoretical may mildly reduce absorption - not dangerous)  .   When she started Sinemet, gait improved but tremor was not changed.     She had a lumbar ESI.    She does not think it helped much.     She had a UTI and took a short course of cipro but stopped due tp sie effects.     Update 01/08/2020: Since the last visit, she has been mostly stable.  She has not had any more falls.  She continues to have a tremor and feels it is doing about the same as her last visit.  She is on Sinemet 25/100 every 4 hours while awake (4 pills a day).  On Sinemet, her gait appeared to improve but the tremor did not change much.  Alprazolam 0.5 mg po qAm, one po qPM, 1 mg qHS has helped her tremor some.  She did get some benefit with propranolol for the tremor but she had hypotension and needed to stop.  We had discussed starting low-dose metoprolol.   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   Her handwriting is poor.   Tremor is at rest and with intention.       She was recently hospitalized for a bleeding colitis.   She also has anxiety.Her back pain is better now than earlier this year.   She never took the carisoprodel prescription or any pain med's.   Tylenol has not helped much  Update  08/12/2019: She fractured the L1 vertebrae September 2020 while moving a mattress and had kyphoplasty 04/03/2019.   She feels she got a little better but is still having a lot of pain.  She is doing PT.     She is on Sinemet 25/100 q 4 hours while awake (4 times a day).   She felt gait improved with sinemet but she still had a tremor.    She feels the xanax has helped the tremors.    Propranolol helped the tremor the most but caused hypotension so she stopped,   She never started the low dose metoprolol.      She is sleeping worse due to the pain.    Methocarbamol was not well tolerated recently.    In the past she could not tolerate Flexeril.    Tramadol was not well tolerated  She also has urinary frequency and constipation, likely related to her PD.     Update 02/06/2019: She continues to have a severe tremor in her right hand but not in the left hand or head.  She feels gait is doing ok.   No recent falls.  She is on Sinemet 25-100 one po tid and alprazolam 0.5 mg qid.     She does not  think Sinemet helped the tremor but gait may be slightly better.     She was hospitalized for a colitis she reports was due to constipation from Sinemet..   She is now also on dicyclomine but had the constipation before that.  She feels the tremor is actually worse 30-90 minutes after each Sinemet and she feels more agitated during that time.    Her tremor had done better on propranolol LA 60 mg but her BP dropped and she felt lightheaded and needed to stop  She has needed bursa injections for hip pain and just had one yesterday.  She is sore and feels she is walking a little worse today.  Alprazolam has helped the anxiety a little bit.   However, she still feels agitated at time during the day.  She reports trying several other medications in the past that did not help the anxiety or were poorly tolerated.  Update 11/28/2017: Her right hand tremor has worsened.  She does not note any significant tremor on the left.   Her tremor will worsen if she is anxious.  She is on Xanax 0.5 mg tid and that helps the tremor some.    She is on Sinemet CR 25/100.   She can tolerate one pill a day but on 2 pills she had a headache.  On 1 pill, she does not note any benefit.  We discussed that that dose is extremely low and would be unlikely to make a difference.    She stumbled some with her gait but has not had falls.  She is concerned about interactions between Wellbutrin and Sinemet.   We discussed that they can be taken at the same time.     She has shoulder pain on her right.   She only got short term benefit from the subacromial bursa injection.    From 03/16/2017: Tremor:   She has a tremor that is worse in the right arm the left arm. She notes it is generally worse when she is aggravated or if she tries to hold still such as holding a cup or spoon to her mouth. She also notes it with writing. Alprazolam has helped the tremor some she takes a low dose 0.25 mg 2-3 times a day.   She first noted a right hand tremor in late 2015.    She notes the tremor in the right hand much more than the left.      Tremor is not present when asleep.    She has not noted the tremor in the head.   She has not had any difficulty with her gait or balance. She has had a couple falls but they occurred with tripping. There is no family history of tremors.   Shoulder pain:   The right shoulder pain returned. It is worse when she holds her arm over her head or externally rotates.  Pain is best when she holds the arm into her chest. In the past, we did a subacromial bursa injection and that helped her for 2-3 months. She also received an injection by her orthopedic surgeon that also helped for 2-3 months.   NSAIDs have not helped.  Nodules:   She has a nodule in the right arm.   She has shown her PCP  Mood:  She has noted some crying spells but no laughing spells. The crying spells and sometimes spontaneous often she is sad at that time. The history of  depression. She prefers not to be on an  antidepressant at this time.   REVIEW OF SYSTEMS: Constitutional: No fevers, chills, sweats, or change in appetite Eyes: No visual changes, double vision, eye pain Ear, nose and throat: No hearing loss, ear pain, nasal congestion, sore throat Cardiovascular: No chest pain, palpitations.  Has had palpitations Respiratory: No shortness of breath at rest or with exertion.   No wheezes GastrointestinaI: No nausea, vomiting, diarrhea, abdominal pain, fecal incontinence Genitourinary: No dysuria, urinary retention or frequency.  No nocturia. Musculoskeletal: No neck pain, back pain.  She notes right shoulder pain Integumentary: No rash, pruritus, skin lesions Neurological: as above Psychiatric: Some crying spells..  Some anxiety Endocrine: No palpitations, diaphoresis, change in appetite, change in weigh or increased thirst Hematologic/Lymphatic: No anemia, purpura, petechiae. Allergic/Immunologic: No itchy/runny eyes, nasal congestion, recent allergic reactions, rashes  ALLERGIES: Allergies  Allergen Reactions  . Iodine Anaphylaxis    IV and topical forms.  [name] [Hyoscyamine Sulfate]     Vision problems/pt has glaucoma  . Salmon [Fish Allergy] Hives and Shortness Of Breath  . Shellfish Allergy Anaphylaxis  . Tramadol Swelling  . Remeron [Mirtazapine] Other (See Comments)    Cause blurred vision and red eyes, pt has glaucoma  . Aspirin Other (See Comments)    Sever stomach pain due to ulcer scaring.  . Ciprofloxacin Diarrhea  . Codeine Nausea And Vomiting  . Darvocet [Propoxyphene N-Acetaminophen] Nausea And Vomiting  . Demerol  [Meperidine Hcl]   . Demerol [Meperidine] Nausea Only  . Dexilant [Dexlansoprazole] Swelling    Redness, swelling and peeling of both feet.  . Diphedryl [Diphenhydramine] Other (See Comments)    Increased pulse/small amount ok  . Doxycycline Hyclate Other (See Comments)    GI intolerance.  Marland Kitchen Epinephrine  Other (See Comments)    Breathing problems  . Erythromycin Other (See Comments)    GI intolerance.  [name] [Cyclobenzaprine] Other (See Comments)    Tingly/prickly sensation.  Marland Kitchen Keflex [Cephalexin] Hives  . Latex Other (See Comments)    Gloves ok.  Skin gets red from elastic in underwear and latex bandaides.  . Nitrofurantoin Diarrhea  . Other   . Prednisone Other (See Comments)    Headache  . Pylera [Bis Subcit-Metronid-Tetracyc] Swelling    Tongue swelling. Face tingling  . Sulfa Antibiotics Other (See Comments)    Increased pulse, fainting, diarrhea, thrush  . Xylocaine [Lidocaine Hcl]     With epinephrine, given by dentist.  Speeded up heart rate and she passed out (occured twice, at dentist)  . Zoloft [Sertraline Hcl] Swelling and Other (See Comments)    Migraine Swelling of tongue/lip (09/2012)  . Advil [Ibuprofen] Other (See Comments)    Motrin ok with a GI effect.  . Biaxin [Clarithromycin] Rash    Started after completing 10 day course of 2000 mg /day, Lips swelling    HOME MEDICATIONS:  Current Outpatient Medications:  .  acetaminophen (TYLENOL 8 HOUR) 650 MG CR tablet, Take 650 mg by mouth every 8 (eight) hours as needed for pain. , Disp: , Rfl:  .  ALPRAZolam (XANAX) 0.5 MG tablet, TAKE BY MOUTH UP TO 3 TIMES DAILY. WARNING: BENZODIAZEPINES INCREASE THE RISK OF FALLS AND NEUROCOGNITIVE DECLINE IN THE ELDERLY (Patient taking differently: 1 mg. TAKE BY MOUTH UP TO 3 TIMES DAILY. WARNING: BENZODIAZEPINES INCREASE THE RISK OF FALLS AND NEUROCOGNITIVE DECLINE IN THE ELDERLY), Disp: 90 tablet, Rfl: 2 .  b complex vitamins tablet, Take 1 tablet by mouth daily., Disp: , Rfl:  .  calcium carbonate (OSCAL)  1500 (600 Ca) MG TABS tablet, Take 600 mg by mouth 2 (two) times daily., Disp: , Rfl:  .  Cholecalciferol (VITAMIN D) 2000 UNITS tablet, Take 2,000 Units by mouth daily., Disp: , Rfl:  .  dicyclomine (BENTYL) 10 MG capsule, dicyclomine 10 mg capsule   10 mg by oral route.,  Disp: , Rfl:  .  Probiotic Product (ALIGN PO), Take 1 capsule by mouth daily. , Disp: , Rfl:  .  SYNTHROID 25 MCG tablet, TAKE 1 TABLET DAILY BEFORE BREAKFAST, Disp: 90 tablet, Rfl: 0 .  carbidopa-levodopa (SINEMET IR) 25-100 MG tablet, 1/2 pill po four times a day., Disp: 60 tablet, Rfl: 5  Current Facility-Administered Medications:  .  0.9 %  sodium chloride infusion, 500 mL, Intravenous, Once, Rachael Fee, MD  PAST MEDICAL HISTORY: Past Medical History:  Diagnosis Date  . Bell's palsy 1966   Hx: right side facial droop, resolved per patient 04/02/19  . Carotid artery disease (HCC) 2010   on vascular screening;unchanged 2013.(could not tolerate simvastatin, no other statins tried)--<30% blockage bilat 07/2011  . Chronic abdominal pain   . Chronic fatigue and malaise   . Claustrophobia   . Depression    treated in the past for years;stopped in 2010 for a years  . Duodenal ulcer 1962   h/o  . Dysrhythmia    ocassional PVC's, no current problems per patient on 04/02/19  . Fibromyalgia   . Frequent PVCs 07/2012   Seen by [facility]: benign, asymptomatic, normal EF  . GERD (gastroesophageal reflux disease)    diet controlled  . Glaucoma, narrow-angle    s/p laser surgery  . History of hiatal hernia    during endoscopy  . Hypothyroid 9/08  . IBS (irritable bowel syndrome)    Dr. Elnoria Howard  . Ischemic colitis (HCC) 11/21/2018   no current problems per patient on 04/02/19  . Ocular migraine   . Osteoporosis 10/11   Dr.Hawkes  . Panic attack   . Parkinson disease (HCC)   . Parkinson's disease (HCC) 06/23/2016  . Recurrent UTI    has cystocele-Dr.Grewal  . Shingles 1999   h/o  . Superficial thrombophlebitis 03/2009   RLE  . Trochanteric bursitis 12/2008   bilateral    PAST SURGICAL HISTORY: Past Surgical History:  Procedure Laterality Date  . ABDOMINAL HYSTERECTOMY    . CATARACT EXTRACTION, BILATERAL  1995, 1996  . EYE SURGERY Bilateral    laser - glaucoma  . Flexible  sigmoidoscopy    . KYPHOPLASTY N/A 04/03/2019   Procedure: KYPHOPLASTY L1;  Surgeon: Venita Lick, MD;  [facility];  Service: Orthopedics;  Laterality: N/A;  90 mins  . THYROIDECTOMY, PARTIAL  09/2005   L nodule; Dr. Gerrit Friends  . TONSILLECTOMY  1946  . UPPER GI ENDOSCOPY  06/27/12  . VAGINAL HYSTERECTOMY  1971   and bladder repair.  Still has ovaries  . WISDOM TOOTH EXTRACTION      FAMILY HISTORY: Family History  Problem Relation Age of Onset  . Heart disease Mother   . Hypertension Mother   . Hypertension Sister   . HIV Son   . Heart disease Brother   . Lung cancer Brother        lung  . Diabetes Maternal Grandfather   . Diabetes Granddaughter        type 1  . Colon cancer Neg Hx   . Esophageal cancer Neg Hx   . Prostate cancer Neg Hx   . Stomach cancer Neg  Hx   . Rectal cancer Neg Hx     SOCIAL HISTORY:  Social History   Socioeconomic History  . Marital status: Married    Spouse name: Chrissie Noa  . Number of children: 2  . Years of education: Not on file  . Highest education level: Not on file  Occupational History  . Occupation: retired (school system)  Tobacco Use  . Smoking status: Never Smoker  . Smokeless tobacco: Never Used  Vaping Use  . Vaping Use: Never used  Substance and Sexual Activity  . Alcohol use: No    Alcohol/week: 0.0 standard drinks  . Drug use: No  . Sexual activity: Not Currently    Partners: Male    Birth control/protection: Other-see comments    Comment: Hysterectomy  Other Topics Concern  . Not on file  Social History Narrative   Married.  Son lives in [place]; Daughter [name] lives in [place]; 2 grandchildren   Social Determinants of Health   Financial Resource Strain:   . Difficulty of Paying Living Expenses:   Food Insecurity:   . Worried About Programme researcher, broadcasting/film/video in the Last Year:   . Barista in the Last Year:   Transportation Needs:   . Freight forwarder (Medical):   Marland Kitchen Lack of Transportation (Non-Medical):    Physical Activity:   . Days of Exercise per Week:   . Minutes of Exercise per Session:   Stress:   . Feeling of Stress :   Social Connections:   . Frequency of Communication with Friends and Family:   . Frequency of Social Gatherings with Friends and Family:   . Attends Religious Services:   . Active Member of Clubs or Organizations:   . Attends Banker Meetings:   Marland Kitchen Marital Status:   Intimate Partner Violence:   . Fear of Current or Ex-Partner:   . Emotionally Abused:   Marland Kitchen Physically Abused:   . Sexually Abused:      PHYSICAL EXAM  Vitals:   02/18/20 1326  BP: (!) 137/71  Pulse: 75  Weight: 98 lb (44.5 kg)  Height: 4\' 11"  (1.499 m)    Body mass index is 19.79 kg/m.   General: The patient is well-developed and well-nourished and in no acute distress  Musculoskeletal:  There is tenderness over the right subacromial bursa and she has a reduced range of motion of the right shoulder, limited by pain.  Skin:   No edema.    There were a couple small subcutaneous nodules in the right arm. .    Neurologic Exam  Mental status: The patient is alert and oriented x 3 at the time of the examination. The patient has apparent normal recent and remote memory, with an apparently normal attention span and concentration ability.   Speech is normal.  Cranial nerves: Extraocular movements are full.   Facial strength is normal.  Trapezius and sternocleidomastoid strength is normal. No dysarthria is noted.    Motor: She does not have bradykinesia.  There is a 6 Hz tremor on the right>> left  that is worse during rest and with intention.   Muscle bulk is normal.   Tone is normal. No cogwheeling.  Strength is  5 / 5 in all 4 extremities.   Sensory: Sensory testing is intact to touch and vibration sensation in all 4 extremities.  Coordination: Cerebellar testing reveals good finger-nose-finger bilaterally.  Gait and station: Station is normal.   The gait has a mildly reduced  stride and normal arm swing.   She is able to turn 180 degrees in 3 or 4 steps.  She has retropulsion.  Romberg is negative.   Reflexes: Deep tendon reflexes are symmetric and normal bilaterally.    DIAGNOSTIC DATA (LABS, IMAGING, TESTING) - I reviewed patient records, labs, notes, testing and imaging myself where available.  Lab Results  Component Value Date   WBC 7.9 01/22/2020   HGB 14.2 01/22/2020   HCT 42.5 01/22/2020   MCV 91 01/22/2020   PLT 268 01/22/2020      Component Value Date/Time   NA 141 09/03/2019 1117   K 4.6 09/03/2019 1117   CL 103 09/03/2019 1117   CO2 25 09/03/2019 1117   GLUCOSE 91 09/03/2019 1117   GLUCOSE 98 04/08/2019 0516   BUN 14 09/03/2019 1117   CREATININE 0.68 09/03/2019 1117   CREATININE 0.75 10/22/2018 1401   CREATININE 0.67 11/23/2016 0815   CALCIUM 9.8 09/03/2019 1117   PROT 6.8 09/03/2019 1117   ALBUMIN 4.2 09/03/2019 1117   AST 19 09/03/2019 1117   AST 17 10/22/2018 1401   ALT 4 09/03/2019 1117   ALT 7 10/22/2018 1401   ALKPHOS 83 09/03/2019 1117   BILITOT 1.0 09/03/2019 1117   BILITOT 0.8 10/22/2018 1401   GFRNONAA 79 09/03/2019 1117   GFRNONAA >60 10/22/2018 1401   GFRAA 92 09/03/2019 1117   GFRAA >60 10/22/2018 1401   Lab Results  Component Value Date   CHOL 233 (H) 08/29/2018   HDL 91 08/29/2018   LDLCALC 116 (H) 08/29/2018   TRIG 128 08/29/2018   CHOLHDL 2.6 08/29/2018   Lab Results  Component Value Date   HGBA1C 5.5 01/23/2018   No results found for: VITAMINB12 Lab Results  Component Value Date   TSH 2.310 01/22/2020       ASSESSMENT AND PLAN    1. Parkinson's disease (HCC)   2. Essential tremor   3. Gait disturbance   4. Depression with anxiety     1.    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  She did feel that she had a benefit when on Sinemet.  However, she is noting some abdominal pain.  We discussed changing from the 25/100 CR preparation to the 25/100 IR preparation to take 1/2 4 times a day.     We  could also consider mysoline 2.    Dr. Rene Kocher has her on Xanax which also helps tremor   Current dose is 0.5-0.5-1.0 mg.   She has depression and anxiety and has had trouble tolerating many med's.  We could consider adding a beta-blocker to see if the tremor improves any 3.   She will return to see me in 6 months or as needed if there are new or worsening neurologic symptoms.   Steele Ledonne A. Epimenio Foot, MD, PhD 02/18/2020, 6:08 PM Certified in Neurology, Clinical Neurophysiology, Sleep Medicine, Pain Medicine and Neuroimaging  [facility] [address] [place], [place] [address] [phone]

## 2020-02-22 DIAGNOSIS — J029 Acute pharyngitis, unspecified: Secondary | ICD-10-CM | POA: Diagnosis not present

## 2020-02-23 ENCOUNTER — Telehealth: Payer: Self-pay | Admitting: Neurology

## 2020-02-23 ENCOUNTER — Ambulatory Visit: Payer: Medicare Other | Admitting: Psychology

## 2020-02-23 NOTE — Telephone Encounter (Signed)
Pt's husband, Amorah Sebring called medication is not working. Pt has complication; emotional and upset. Mr. Coen would like a call from the nurse.

## 2020-02-23 NOTE — Telephone Encounter (Signed)
Ok should take the prozac then

## 2020-02-23 NOTE — Telephone Encounter (Signed)
Called husband back to further discuss. States wife is having problems with sinemet. She did switch to carbidopa-levodopa IR 25-100, 1/2 tab po QID. She did not notice a benefit so she went back to carbidopa-levodopa ER 25-100, 1 tablet po QID. She is still feeling very emotional, frigidity and anxious. Wanting to know what [name] would recommend at this point. Advised I will send him a message and call back once he replies.

## 2020-02-23 NOTE — Telephone Encounter (Signed)
Lets try sertraline 50 mg daily which may help the mood more

## 2020-02-23 NOTE — Telephone Encounter (Signed)
Called pt back. She has allergy to sertraline, cannot take this. She has tried fluoxetine (prozac)10mg  in the past (06/11/2019).  She no longer takes this but said she tolerated it well and willing to retry this. She has a full bottle at home that's states to discard by 06/2020. Advised I will send message to MD and call her back.  She was recently placed on pristiq 25mg  ER but this has caused her stomach issues and planning on stopping this. She only took a couple doses. She has gastritis and states she is more sensitive to medications.

## 2020-02-23 NOTE — Telephone Encounter (Signed)
Called pt back and advised [name] ok with her restarting fluoxetine 10mg  po qd. She will try this for at least 2-3 weeks. She will call back if ineffective.

## 2020-02-24 ENCOUNTER — Telehealth: Payer: Self-pay | Admitting: Neurology

## 2020-02-24 NOTE — Telephone Encounter (Signed)
Called pt back. Relayed Dr. Garth Bigness message. She verbalized understanding. She already has a psychiatrist. They are the ones who placed her on pristiq but she had SE on this (stomach problems). She called her psychiatrist about this but has not heard back. She is going to reach out to them again. Will see if they are ok with her doing duloxetine with xanax instead.

## 2020-02-24 NOTE — Telephone Encounter (Signed)
Pt stated, pharmacy told her there is a interaction between FLUoxetine (PROZAC) 10 MG tablet and ALPRAZolam (XANAX) 0.5 MG tablet. Pt would like a call from the nurse the best time to take FLUoxetine (PROZAC) 10 MG tablet.

## 2020-02-24 NOTE — Telephone Encounter (Signed)
Dr. Sater- please advise 

## 2020-02-24 NOTE — Telephone Encounter (Signed)
There is no serious interaction --- fluoxetine can make the alprazolam last a little longer in the body which could make one drowsier.   if she gets drowsy she can stop.   We can also refer to psychiatry if she feels anxiety is worsening

## 2020-02-26 DIAGNOSIS — M5416 Radiculopathy, lumbar region: Secondary | ICD-10-CM | POA: Diagnosis not present

## 2020-03-08 ENCOUNTER — Other Ambulatory Visit: Payer: Medicare Other | Admitting: *Deleted

## 2020-03-08 ENCOUNTER — Other Ambulatory Visit: Payer: Self-pay

## 2020-03-08 DIAGNOSIS — Z515 Encounter for palliative care: Secondary | ICD-10-CM

## 2020-03-10 ENCOUNTER — Other Ambulatory Visit: Payer: Self-pay

## 2020-03-10 ENCOUNTER — Other Ambulatory Visit (INDEPENDENT_AMBULATORY_CARE_PROVIDER_SITE_OTHER): Payer: Medicare Other

## 2020-03-10 VITALS — BP 110/60 | HR 80 | Ht 59.0 in | Wt 99.2 lb

## 2020-03-10 DIAGNOSIS — R631 Polydipsia: Secondary | ICD-10-CM | POA: Diagnosis not present

## 2020-03-10 DIAGNOSIS — R42 Dizziness and giddiness: Secondary | ICD-10-CM | POA: Diagnosis not present

## 2020-03-10 DIAGNOSIS — R251 Tremor, unspecified: Secondary | ICD-10-CM | POA: Diagnosis not present

## 2020-03-10 LAB — GLUCOSE, POCT (MANUAL RESULT ENTRY): POC Glucose: 121 mg/dl — AB (ref 70–99)

## 2020-03-11 ENCOUNTER — Telehealth: Payer: Self-pay | Admitting: Family Medicine

## 2020-03-11 DIAGNOSIS — Z85828 Personal history of other malignant neoplasm of skin: Secondary | ICD-10-CM | POA: Diagnosis not present

## 2020-03-11 DIAGNOSIS — L570 Actinic keratosis: Secondary | ICD-10-CM | POA: Diagnosis not present

## 2020-03-11 DIAGNOSIS — L820 Inflamed seborrheic keratosis: Secondary | ICD-10-CM | POA: Diagnosis not present

## 2020-03-11 NOTE — Telephone Encounter (Signed)
Pt called and has some questions concerning Prolia. She states she is to received cortisone shots in both hips next week. She wants to know if that will interfere with her next Prolia shot which is due after 04/21/2020. She also states she needs some dental work done, fillings per pt, appt for that has not been scheduled yet. With that she has experienced jaw pain. She wants to know of that will interfere with Sept Prolia. Pt can be reached at [phone].

## 2020-03-11 NOTE — Telephone Encounter (Signed)
Pt advised.

## 2020-03-11 NOTE — Telephone Encounter (Signed)
No issue related to the cortisone shots next week. Dental work is best done just prior to next injection (or can delay the injection, if needed).  Just regular fillings are NOT an issue, only with more extensive dentalwork (and the dentist may make the recommendation to postpone the shot if significant work being done). Please advise pt

## 2020-03-12 DIAGNOSIS — N83202 Unspecified ovarian cyst, left side: Secondary | ICD-10-CM | POA: Diagnosis not present

## 2020-03-12 DIAGNOSIS — N83201 Unspecified ovarian cyst, right side: Secondary | ICD-10-CM | POA: Diagnosis not present

## 2020-03-15 ENCOUNTER — Other Ambulatory Visit: Payer: Medicare Other

## 2020-03-16 ENCOUNTER — Emergency Department (HOSPITAL_COMMUNITY): Payer: Medicare Other

## 2020-03-16 ENCOUNTER — Encounter: Payer: Self-pay | Admitting: Family Medicine

## 2020-03-16 ENCOUNTER — Encounter (HOSPITAL_COMMUNITY): Payer: Self-pay | Admitting: Emergency Medicine

## 2020-03-16 ENCOUNTER — Ambulatory Visit (INDEPENDENT_AMBULATORY_CARE_PROVIDER_SITE_OTHER): Payer: Medicare Other | Admitting: Family Medicine

## 2020-03-16 ENCOUNTER — Emergency Department (HOSPITAL_COMMUNITY)
Admission: EM | Admit: 2020-03-16 | Discharge: 2020-03-16 | Disposition: A | Discharge status: Home / Self Care | Payer: Medicare Other | Attending: Emergency Medicine | Admitting: Emergency Medicine

## 2020-03-16 ENCOUNTER — Other Ambulatory Visit: Payer: Self-pay

## 2020-03-16 VITALS — BP 140/72 | HR 73 | Temp 97.7°F

## 2020-03-16 DIAGNOSIS — Z5321 Procedure and treatment not carried out due to patient leaving prior to being seen by health care provider: Secondary | ICD-10-CM | POA: Insufficient documentation

## 2020-03-16 DIAGNOSIS — I10 Essential (primary) hypertension: Secondary | ICD-10-CM | POA: Diagnosis not present

## 2020-03-16 DIAGNOSIS — R0789 Other chest pain: Secondary | ICD-10-CM | POA: Insufficient documentation

## 2020-03-16 DIAGNOSIS — R079 Chest pain, unspecified: Secondary | ICD-10-CM | POA: Diagnosis not present

## 2020-03-16 DIAGNOSIS — R110 Nausea: Secondary | ICD-10-CM | POA: Diagnosis not present

## 2020-03-16 DIAGNOSIS — J90 Pleural effusion, not elsewhere classified: Secondary | ICD-10-CM | POA: Diagnosis not present

## 2020-03-16 LAB — CBC
HCT: 45.5 % (ref 36.0–46.0)
Hemoglobin: 14.3 g/dL (ref 12.0–15.0)
MCH: 29.5 pg (ref 26.0–34.0)
MCHC: 31.4 g/dL (ref 30.0–36.0)
MCV: 93.8 fL (ref 80.0–100.0)
Platelets: 316 10*3/uL (ref 150–400)
RBC: 4.85 MIL/uL (ref 3.87–5.11)
RDW: 13.1 % (ref 11.5–15.5)
WBC: 8.7 10*3/uL (ref 4.0–10.5)
nRBC: 0 % (ref 0.0–0.2)

## 2020-03-16 LAB — BASIC METABOLIC PANEL
Anion gap: 9 (ref 5–15)
BUN: 10 mg/dL (ref 8–23)
CO2: 27 mmol/L (ref 22–32)
Calcium: 9.6 mg/dL (ref 8.9–10.3)
Chloride: 106 mmol/L (ref 98–111)
Creatinine, Ser: 0.62 mg/dL (ref 0.44–1.00)
GFR calc Af Amer: >60 mL/min (ref >60)
GFR calc non Af Amer: >60 mL/min (ref >60)
Glucose, Bld: 97 mg/dL (ref 70–99)
Potassium: 4.2 mmol/L (ref 3.5–5.1)
Sodium: 142 mmol/L (ref 135–145)

## 2020-03-16 LAB — TROPONIN I (HIGH SENSITIVITY): Troponin I (High Sensitivity): 4 ng/L (ref <18)

## 2020-03-16 NOTE — ED Triage Notes (Signed)
Patient arrives to ED with complaints of intermittent left sided chest pressure over the last month. Pt denies SOB & N/V/D. Pt states no difference in pain today just was sent here by her PCP.

## 2020-03-16 NOTE — ED Notes (Signed)
Pt did not answer for repeat labs

## 2020-03-16 NOTE — Progress Notes (Signed)
   Subjective:    Patient ID: Tracy Malone, female    DOB: Aug 07, 1931, 86 y.o.   MRN: [id number]  HPI She states that around 1 in the morning she was awakened with chest pressure that has been intermittent in nature.  No associated shortness of breath, diaphoresis or weakness.  She has never had these symptoms before.  No nausea, vomiting.  She does complain of some left-sided rib pain separate from the pressure sensation.   Review of Systems     Objective:   Physical Exam Alert but appearing in distress.  Cardiac exam shows a regular rhythm without murmurs or gallops.  Lungs are clear to auscultation.  EKG compared to previous tracing of 9/92020 essentially unchanged.  No acute changes noted.       Assessment & Plan:  Left chest pressure - Plan: EKG [facility] called for transfer to hospital to further evaluate her chest pressure. Offered aspirin but apparently she did not want to take it due to possible GI distress.

## 2020-03-17 ENCOUNTER — Ambulatory Visit (INDEPENDENT_AMBULATORY_CARE_PROVIDER_SITE_OTHER): Payer: Medicare Other | Admitting: Family Medicine

## 2020-03-17 ENCOUNTER — Encounter: Payer: Self-pay | Admitting: Family Medicine

## 2020-03-17 VITALS — BP 140/80 | HR 76 | Temp 98.2°F | Ht 59.0 in | Wt 98.4 lb

## 2020-03-17 DIAGNOSIS — F332 Major depressive disorder, recurrent severe without psychotic features: Secondary | ICD-10-CM

## 2020-03-17 DIAGNOSIS — K219 Gastro-esophageal reflux disease without esophagitis: Secondary | ICD-10-CM | POA: Diagnosis not present

## 2020-03-17 DIAGNOSIS — E039 Hypothyroidism, unspecified: Secondary | ICD-10-CM | POA: Diagnosis not present

## 2020-03-17 DIAGNOSIS — R0789 Other chest pain: Secondary | ICD-10-CM

## 2020-03-17 NOTE — Progress Notes (Signed)
Chief Complaint  Patient presents with  . Hospitalization Follow-up    hospital follow up.   Yesterday she woke up with heaviness in her chest. Still had it at visit with Dr. Redmond School yesterday. Per JCL, no changes in EKG. She reports that the discomfort resolved after getting blood drawn at ER. Pain hasn't recurred. No associated nausea or belching yesterday. She has some constipation off/on, had diarrhea this morning.  Took Bentyl this morning--had spasms f/b diarrhea.  Took bentyl with no further pain today.  No longer taking PPI,  She reports that Dr. Ardis Hughs told her she can stop taking the PPI in April.  Has used just a few times prn.  She has been eating later at night, at 7pm.  States she used to eat 5:30-6. Feels like her food doesn't go anywhere, lays in her stomach  She has been getting some gas and discomfort at night. Mylanta helps.  Husband tries to elevate the [facility], but she doesn't think it helps.  She and her husband are concerned that they felt a lump in the L abdomen yesterday.  She reports she was prescribed Pristiq from psych--hasn't started yet  She is asking about timing of her Synthroid. She has been taking at 6am, can't get back to sleep.  PMH, PSH, SH reviewed  Outpatient Encounter Medications as of 03/17/2020  Medication Sig  . ALPRAZolam (XANAX) 1 MG tablet Take 1 mg by mouth at bedtime.   Marland Kitchen b complex vitamins tablet Take 1 tablet by mouth daily.  . calcium carbonate (OSCAL) 1500 (600 Ca) MG TABS tablet Take 600 mg by mouth 2 (two) times daily.  . Carbidopa-Levodopa ER (SINEMET CR) 25-100 MG tablet controlled release Take 1 tablet by mouth in the morning, at noon, in the evening, and at bedtime.   . Cholecalciferol (VITAMIN D) 2000 UNITS tablet Take 2,000 Units by mouth daily.  Marland Kitchen dicyclomine (BENTYL) 10 MG capsule dicyclomine 10 mg capsule   10 mg by oral route.  . Probiotic Product (ALIGN PO) Take 1 capsule by mouth daily.   Marland Kitchen SYNTHROID 25 MCG tablet TAKE 1  TABLET DAILY BEFORE BREAKFAST  . acetaminophen (TYLENOL 8 HOUR) 650 MG CR tablet Take 650 mg by mouth every 8 (eight) hours as needed for pain.  (Patient not taking: Reported on 03/17/2020)  . [DISCONTINUED] ALPRAZolam (XANAX) 0.5 MG tablet TAKE BY MOUTH UP TO 3 TIMES DAILY. WARNING: BENZODIAZEPINES INCREASE THE RISK OF FALLS AND NEUROCOGNITIVE DECLINE IN THE ELDERLY (Patient not taking: Reported on 03/16/2020)   Facility-Administered Encounter Medications as of 03/17/2020  Medication  . 0.9 %  sodium chloride infusion   Allergies  Allergen Reactions  . Iodine Anaphylaxis    IV and topical forms.  Burnard Leigh [Hyoscyamine Sulfate]     Vision problems/pt has glaucoma  . Salmon [Fish Allergy] Hives and Shortness Of Breath  . Shellfish Allergy Anaphylaxis  . Tramadol Swelling  . Remeron [Mirtazapine] Other (See Comments)    Cause blurred vision and red eyes, pt has glaucoma  . Aspirin Other (See Comments)    Sever stomach pain due to ulcer scaring.  . Ciprofloxacin Diarrhea  . Codeine Nausea And Vomiting  . Darvocet [Propoxyphene N-Acetaminophen] Nausea And Vomiting  . Demerol  [Meperidine Hcl]   . Demerol [Meperidine] Nausea Only  . Dexilant [Dexlansoprazole] Swelling    Redness, swelling and peeling of both feet.  . Diphedryl [Diphenhydramine] Other (See Comments)    Increased pulse/small amount ok  . Doxycycline Hyclate Other (See  Comments)    GI intolerance.  Marland Kitchen Epinephrine Other (See Comments)    Breathing problems  . Erythromycin Other (See Comments)    GI intolerance.  Yvette Rack [Cyclobenzaprine] Other (See Comments)    Tingly/prickly sensation.  Marland Kitchen Keflex [Cephalexin] Hives  . Latex Other (See Comments)    Gloves ok.  Skin gets red from elastic in underwear and latex bandaides.  . Nitrofurantoin Diarrhea  . Other   . Prednisone Other (See Comments)    Headache  . Pylera [Bis Subcit-Metronid-Tetracyc] Swelling    Tongue swelling. Face tingling  . Sulfa Antibiotics Other  (See Comments)    Increased pulse, fainting, diarrhea, thrush  . Xylocaine [Lidocaine Hcl]     With epinephrine, given by dentist.  Speeded up heart rate and she passed out (occured twice, at dentist)  . Zoloft [Sertraline Hcl] Swelling and Other (See Comments)    Migraine Swelling of tongue/lip (09/2012)  . Advil [Ibuprofen] Other (See Comments)    Motrin ok with a GI effect.  . Biaxin [Clarithromycin] Rash    Started after completing 10 day course of 2000 mg /day, Lips swelling   ROS:  No fever, chills, URI symptoms headaches, dizziness.  Chest pain resolved.  No nausea, vomiting.  GI per HPI. No dysuria. Some L sided abdominal discomfort intermittently. No rashes.  +back pain and bilateral hip pain--getting injections for bursitis tomorrow. +depressed mood.   PHYSICAL EXAM:  BP 140/80   Pulse 76   Temp 98.2 F (36.8 C) (Tympanic)   Ht 4\' 11"  (1.499 m)   Wt 98 lb 6.4 oz (44.6 kg)   LMP  (LMP Unknown)   BMI 19.87 kg/m   Pleasant, elderly female, appears tired, but in no distress HEENT: conjunctiva and sclera are clear, EOMI, wearing mask Neck: no lymphadenopathy Heart: regular rate and rhythm Lungs: clear bilaterally Back: no CVA tenderness, no spinal tenderness Tender at bilateral trochanteric bursa Abdomen: soft.  Minimally tender over suprapubic area, and LLQ.  No mass, no rebound, guarding or mass. Patient and husband also checked her abdomen, and could not find the lump they had been concerned about that they noticed yesterday. Chest:  Slight tenderness at 1 level at L costochondral junction nontender elsewhere (different than the discomfort she had yesterday) Extremities: no edema Psych: depressed mood, full range of affect. Normal grooming, eye contact, speech.   ER records reviewed, including labs, CXR.  Reviewed with patient and questions answered.    ASSESSMENT/PLAN:  Left chest pressure - resolved  Gastroesophageal reflux disease, unspecified whether  esophagitis present - trial pepcid with dinner (vs restart omeprazole if pepcid not effective)  Severe recurrent major depression without psychotic features ([place]) - to start pristiq as prescribed by her psychiatrist  Hypothyroidism, unspecified type - reviewed proper timing of med, vs food, calcium, other medications  35 min visit, all questions and concerns addressed. More than 1/2 spent counseling. Additional time spent in chart review and documentation. Take pepcid (famotidine) 20mg  with dinner for the next 7-10 days to see if this helps with any indigestion or chest pain. If this isn't effective, you can try switching to prilosec (omeprazole) instead. If none of these help, you can stop both of them.  Continue to try and keep smaller meals in the evening, and/or wait at least 2-3 hours after eating before laying down.

## 2020-03-17 NOTE — Patient Instructions (Addendum)
  Take pepcid (famotidine) 20mg  with dinner for the next 7-10 days to see if this helps with any indigestion or chest pain. If this isn't effective, you can try switching to prilosec (omeprazole) instead. If none of these help, you can stop both of them.  Continue to try and keep smaller meals in the evening, and/or wait at least 2-3 hours after eating before laying down.     You can take your synthroid when you first wake up (you don't need to set an alarm). Just be sure to wait 30 minutes before eating and taking other medications. You should delay taking your first calcium pill by 4 hours--take with lunch and dinner, rather than at breakfast.

## 2020-03-18 ENCOUNTER — Ambulatory Visit (INDEPENDENT_AMBULATORY_CARE_PROVIDER_SITE_OTHER): Payer: Medicare Other | Admitting: Psychology

## 2020-03-18 DIAGNOSIS — M5136 Other intervertebral disc degeneration, lumbar region: Secondary | ICD-10-CM | POA: Diagnosis not present

## 2020-03-18 DIAGNOSIS — F411 Generalized anxiety disorder: Secondary | ICD-10-CM

## 2020-03-20 DIAGNOSIS — M5136 Other intervertebral disc degeneration, lumbar region: Secondary | ICD-10-CM | POA: Diagnosis not present

## 2020-03-26 NOTE — Progress Notes (Signed)
COMMUNITY PALLIATIVE CARE RN NOTE  PATIENT NAME: Tracy Malone DOB: 02/12/1932 MRN: [id number]  PRIMARY CARE PROVIDER: [name], MD  RESPONSIBLE PARTY: Tracy Malone (husband) Acct ID - Guarantor Home Phone Work Phone Relationship Acct Type  [id number] LUSIA, [phone] Self P/F     [address], [place], [place] [address]   Covid-19 Pre-screening Negative  PLAN OF CARE and INTERVENTION:  1. ADVANCE CARE PLANNING/GOALS OF CARE: Goal is for patient to remain at home and improved mood. She is a Full code. 2. PATIENT/CAREGIVER EDUCATION: Symptom management, pain management, safe mobility/transfers 3. DISEASE STATUS: Met with patient and her husband, Tracy Malone, in their home. Patient reports not sleeping well last night and waking up at 2am with pelvic cramping. She did take some medication to help but woke up she says in a "twisted" mood. She continues with pain in her lower back that travels down her hips to her feet. She feels she would benefit from a firmer mattress. She uses heat and cold therapy, Bengay and Tylenol to help minimize her pain. She is ambulatory without the use of assistive devices and able to perform personal care independently. She is unable to bend over, as she will lose her balance. She is no longer able to perform household chores. She does have someone to help clean monthly. She says she is experiencing increased stress and depression. She feels that it is "always something," in regards to her health. She was switched to Wellbutrin ER tabs and today is the 1st day she has taken this. She has an appointment with her counselor on 9/1. She is frustrated that d/t her worsening right hand tremor that she is no longer able to knit. She does not like to go out anymore because she has to take multiple breaks when walking in a department or grocery store. She is fearful of being alone. She is also worried that her husband will fall when performing certain tasks, as  his gait is more unsteady. She appears thinner, especially in her face/neck region. She says she does not have much of an appetite. She does have good fluid intake. Will continue to monitor.   HISTORY OF PRESENT ILLNESS: This is a 86 yo female with a diagnosis of Parkinson's disease. She has a h/o GERD, IBS, ischemic colitis, hypothyroidism, essential tremors and osteoporosis. Palliative care team continues to follow patient and will visit monthly and PRN.    CODE STATUS: Full code  ADVANCED DIRECTIVES: Y MOST FORM: no PPS: 50%   PHYSICAL EXAM:   LUNGS: clear to auscultation  CARDIAC: Cor RRR EXTREMITIES: No edema SKIN: Exposed skin is dry and intact  NEURO: alert and oriented x 3, depressed mood, generalized weakness, ambulatory   (Duration of visit and documentation 45 minutes)   Tracy Eastern, RN BSN

## 2020-04-01 ENCOUNTER — Encounter: Payer: Self-pay | Admitting: Family Medicine

## 2020-04-01 ENCOUNTER — Telehealth: Payer: Self-pay | Admitting: Family Medicine

## 2020-04-01 NOTE — Telephone Encounter (Signed)
This is a chronic complaint. She has ongoing L sided GI issues for a long time. I don't think she needs eval in office today if no fever, vomiting, or blood in the stool or severe pain. Have her drink plenty of water, rest.   To continue to eat bland diet, avoid dairy, possible take a probiotic as well. I truly don't think I have anything to offer you at a visit in the office today that would be different. Often times stress/anxiety and trigger IBS symptoms and pain, so be sure that you are also working on self-care for these issues.  If your GI symptoms persist or worsen, consider following up with your gastroenterologist.

## 2020-04-01 NOTE — Telephone Encounter (Signed)
Spoke with patient and advised her of all of Dr.Knapp's recommendations-patient verbalized understanding.

## 2020-04-01 NOTE — Telephone Encounter (Signed)
Pt called and states that she having nausea lower left side pain states it has been going on for a while, she is also states that every time she eats she Is having loose stools, states he stomach is very hard on her lower stomach,  No other symptoms

## 2020-04-05 ENCOUNTER — Ambulatory Visit (INDEPENDENT_AMBULATORY_CARE_PROVIDER_SITE_OTHER): Payer: Medicare Other | Admitting: Psychology

## 2020-04-05 ENCOUNTER — Telehealth: Payer: Self-pay

## 2020-04-05 DIAGNOSIS — F411 Generalized anxiety disorder: Secondary | ICD-10-CM

## 2020-04-05 NOTE — Telephone Encounter (Signed)
Telephone call to patient to schedule palliative care visit with patient. Patient/family in agreement with home visit on 9-22-21at 11:00AM

## 2020-04-06 DIAGNOSIS — M5136 Other intervertebral disc degeneration, lumbar region: Secondary | ICD-10-CM | POA: Diagnosis not present

## 2020-04-06 DIAGNOSIS — M791 Myalgia, unspecified site: Secondary | ICD-10-CM | POA: Diagnosis not present

## 2020-04-06 DIAGNOSIS — G20 Parkinson's disease: Secondary | ICD-10-CM | POA: Diagnosis not present

## 2020-04-06 DIAGNOSIS — F419 Anxiety disorder, unspecified: Secondary | ICD-10-CM | POA: Diagnosis not present

## 2020-04-09 ENCOUNTER — Other Ambulatory Visit: Payer: Self-pay

## 2020-04-09 ENCOUNTER — Other Ambulatory Visit: Payer: Medicare Other | Admitting: *Deleted

## 2020-04-09 DIAGNOSIS — Z515 Encounter for palliative care: Secondary | ICD-10-CM

## 2020-04-12 ENCOUNTER — Ambulatory Visit (INDEPENDENT_AMBULATORY_CARE_PROVIDER_SITE_OTHER): Payer: Medicare Other | Admitting: Psychology

## 2020-04-12 DIAGNOSIS — F411 Generalized anxiety disorder: Secondary | ICD-10-CM

## 2020-04-14 ENCOUNTER — Telehealth: Payer: Self-pay | Admitting: Family Medicine

## 2020-04-14 NOTE — Telephone Encounter (Signed)
Called pt concerning her next Prolia injection. She is due 04/22/2020. Pt declined to make an appt. She states she is seeing a dermatologist for a skin condition. She states she wants to see that provider again prior to injection but does not have a appt as of yet. Pt was informed that I would be sending a message back to Dr. Tomi Bamberger to inform and inquire about pt having Prolia late. Please advise pt and me as to what she needs to do.

## 2020-04-14 NOTE — Telephone Encounter (Signed)
Advise patient that if there is a significant delay in Prolia shot, there can be increased risk for fracture.  I'd prefer for her to remain on time, if feasible.  Delaying by a week is okay, but not to put it off much longer (unless there is a true/valid reason, ie getting dental work and they want her off it for a bit, etc).

## 2020-04-15 NOTE — Telephone Encounter (Signed)
Patient advised and encouraged to continue Prolia injections. She said she will call back Monday and let us know.

## 2020-04-15 NOTE — Telephone Encounter (Signed)
Pt was called to relay message concerning prolia. Pt states she was able to get an appt with dermatologist for this afternoon. She is going to ask about Prolia and will call me back.  Also pt states the last time she had Prolia she had significant joint pain after injection. She wants to know if that is something she should be concerned about. Please advise pt.

## 2020-04-15 NOTE — Telephone Encounter (Signed)
It is hard to say.  Most of our patients do not experience this.  If the pain is short-lived (1-2 days; the shot works for 6 months), then I would encourage her to continue taking the shots (and treat any pain accordingly, if needed).

## 2020-04-16 DIAGNOSIS — Z85828 Personal history of other malignant neoplasm of skin: Secondary | ICD-10-CM | POA: Diagnosis not present

## 2020-04-16 DIAGNOSIS — L578 Other skin changes due to chronic exposure to nonionizing radiation: Secondary | ICD-10-CM | POA: Diagnosis not present

## 2020-04-16 DIAGNOSIS — L244 Irritant contact dermatitis due to drugs in contact with skin: Secondary | ICD-10-CM | POA: Diagnosis not present

## 2020-04-19 ENCOUNTER — Telehealth: Payer: Self-pay | Admitting: Gastroenterology

## 2020-04-19 ENCOUNTER — Telehealth: Payer: Self-pay | Admitting: Family Medicine

## 2020-04-19 NOTE — Telephone Encounter (Signed)
She last had white cell count done 8/24 and it was normal.  No bloodwork with her prolia injection.

## 2020-04-19 NOTE — Telephone Encounter (Signed)
The pt has been scheduled to see Colleen on 05/19/20 for change in bowels, bloating and gas.  She will call back if she worsens prior to that time.

## 2020-04-19 NOTE — Telephone Encounter (Signed)
Pt is requesting a call back from a nurse to discuss her loose stools and cramping going on for about a week now.

## 2020-04-19 NOTE — Telephone Encounter (Signed)
Pt advised.

## 2020-04-19 NOTE — Telephone Encounter (Signed)
Pt called and will be scheduling an appt to get her prolia shot soon but she wanted to see when she schedules if she can get some blood work done. She said her feet are always cold and she wants to have her white blood cell checked

## 2020-04-23 ENCOUNTER — Other Ambulatory Visit: Payer: Medicare Other

## 2020-04-23 ENCOUNTER — Other Ambulatory Visit: Payer: Self-pay

## 2020-04-23 DIAGNOSIS — Z515 Encounter for palliative care: Secondary | ICD-10-CM

## 2020-04-23 NOTE — Progress Notes (Signed)
COMMUNITY PALLIATIVE CARE SW NOTE  PATIENT NAME: Tracy Malone DOB: July 13, 1932 MRN: [id number]  PRIMARY CARE PROVIDER: [name], MD  RESPONSIBLE PARTY:  Acct ID - Guarantor Home Phone Work Phone Relationship Acct Type  [id number] [name] Self P/F     [address], [place], [place] [address]     PLAN OF CARE and INTERVENTIONS:             1. GOALS OF CARE/ ADVANCE CARE PLANNING:  Patient is a DNR. The goal is for patient to remain in her home safely and as independent as possible. 2. SOCIAL/EMOTIONAL/SPIRITUAL ASSESSMENT/ INTERVENTIONS:  SW completed a visit with patient at her home. She was present with her husband-[name]. She report ongoing generalized pain to her back and legs that she feels is a result of her Parkinson's disease. She report not being able to sleep well or get comfortable due to overall discomfort and pain. Patient report issues with her stomach that causes nausea. She reports poor appetite. She is having difficulty with accepting the challenges of her disease and life. She was tearful as she shared family issues that have contributed to her depression and her inability to move pass the emotional pain and hurt she is feeling. SW provided positive affirmations about her overall health status and encouraged her find positive aspects of family and life to focus on. SW suggested that she participate in a legacy project for her family, attend the [facility] for exercise or do activities with her husband at home or something as simple as going for a car ride. Patient indicated that it would be difficult for her to participate in any of these activities because she is too easily fatigued. Patient was provided resources for group counseling by her personal therapist. She advised that she did not like the zoom format for group. Patient is scheduled for a psych evaluation on 10/12. SW provided supportive presence and counseling, active listening, suggested  interventions/activites to improve quality of life, reassurance of ongoing support.  3. PATIENT/CAREGIVER EDUCATION/ COPING:  Patient has a supportive family. 4. PERSONAL EMERGENCY PLAN:  911 can be activated for emergencies. 5. COMMUNITY RESOURCES COORDINATION/ HEALTH CARE NAVIGATION:  Patient will have a psych evaluation on 10/12. 6. FINANCIAL/LEGAL CONCERNS/INTERVENTIONS:  None.     SOCIAL HX:  Social History   Tobacco Use  . Smoking status: Never Smoker  . Smokeless tobacco: Never Used  Substance Use Topics  . Alcohol use: No    Alcohol/week: 0.0 standard drinks    CODE STATUS: DNR ADVANCED DIRECTIVES: No MOST FORM COMPLETE:  No HOSPICE EDUCATION PROVIDED: No  PPS: Patient continues to ambulate independently. She is independent for personal care needs. She is alert and oriented x3.   Duration of visit is 60 minutes       [employer], LCSW

## 2020-04-23 NOTE — Progress Notes (Signed)
COMMUNITY PALLIATIVE CARE RN NOTE  PATIENT NAME: Tracy Malone DOB: October 14, 1931 MRN: [id number]  PRIMARY CARE PROVIDER: [name], MD  RESPONSIBLE PARTY: [name] (husband) Acct ID - Guarantor Home Phone Work Phone Relationship Acct Type  [id number] Tracy, [phone] Self P/F     [address], [place], [place] [address]   Covid-19 Pre-screening Negative  PLAN OF CARE and INTERVENTION:  1. ADVANCE CARE PLANNING/GOALS OF CARE: Goal is for patient to remain at home with her husband and to be able to be more active. She is a Full code. 2. PATIENT/CAREGIVER EDUCATION: Symptom management, pain management, safe mobility, s/s of infection 3. DISEASE STATUS: Met with patient and her husband, [name], who is also a Palliative care patient. Patient reports that she has been more emotional and tearful recently and it occurs all of a sudden. She worries a lot and everything concerns her. She feels that she does not have any peace. She is depressed that she cannot do things like she once was able to do such as crocheting. She feels that her tremors have worsening over the past month.  She also speaks of feeling tired all of the time and feeling as if she could fall asleep at any time. She speaks about the injections that she has received in her back. She continues with lower back and bilateral leg pain, but says that she does not want any more "needles." She also reports pain in her left abdomen and says it feels swollen. She sometimes applies ice which she says feels good. She also takes Tylenol to help. She does have cramps/spasms in her lower pelvic region. She feels limited to what she can eat d/t her digestive issues. She does not have much of an appetite and eats small portions. She has occasional diarrhea. She continues to speak with a counselor on a weekly basis. She has an appointment with [name] on 9/26. It was suggested that she participate in group sessions for  patient's with Parkinson's disease. Patient does not feel this is a good idea for her and feels it will make her more emotional. She is taking her Xanax routinely and it does help. She often speaks about how she does not like taking medications d/t her sensitivity to them. She remains ambulatory and able to perform ADLs independently. She travelled outside to [place] with her husband, but had a difficult time d/t back pain and discomfort. She recently had a skin cancer removed on her chest. She currently has some red, splotchy areas noted on her chest. She does have cream that she applies to help. Will continue to monitor.   HISTORY OF PRESENT ILLNESS: This is a 86 yo female with a diagnosis of Parkinson'sdisease.She has a h/o GERD, IBS, ischemic colitis, hypothyroidism, essential tremors and osteoporosis.Palliative care team continues to follow patient and will visit monthly and PRN.    CODE STATUS: Full code ADVANCED DIRECTIVES: Y MOST FORM: no PPS: 50%   (Duration of visit and documentation 60 minutes)   [name], RN BSN

## 2020-04-26 ENCOUNTER — Ambulatory Visit (INDEPENDENT_AMBULATORY_CARE_PROVIDER_SITE_OTHER): Payer: Medicare Other | Admitting: Psychology

## 2020-04-26 DIAGNOSIS — F411 Generalized anxiety disorder: Secondary | ICD-10-CM | POA: Diagnosis not present

## 2020-04-27 ENCOUNTER — Telehealth: Payer: Self-pay | Admitting: Family Medicine

## 2020-04-27 ENCOUNTER — Other Ambulatory Visit: Payer: Medicare Other

## 2020-04-27 NOTE — Telephone Encounter (Signed)
Pt Called about her Prolia that she is getting tomorrow, she is having abdominal issues and seeing GI at end of month and wanted to know if the Prolia would cause any abdominal issues or make it worse? Also she wants to know how long after her Prolia does she need to wait before getting the flu shot?

## 2020-04-27 NOTE — Telephone Encounter (Signed)
Advise pt--no need to wait for flu shot, she can get the same day as Prolia if she wants.  As far as GI side effects---they are not common . They do list possibility of abdominal pain and constipation, but low down on the list.  And if related to the medication, they would not be prominent now, when due for her next injection.  She has had GI problems PRIOR to starting the Prolia treatments, so I do not think she should delay her treatment.  Please advise.

## 2020-04-27 NOTE — Telephone Encounter (Signed)
Called pt and informed

## 2020-04-28 ENCOUNTER — Other Ambulatory Visit: Payer: Self-pay

## 2020-04-28 ENCOUNTER — Other Ambulatory Visit: Payer: Medicare Other

## 2020-04-28 ENCOUNTER — Other Ambulatory Visit (INDEPENDENT_AMBULATORY_CARE_PROVIDER_SITE_OTHER): Payer: Medicare Other

## 2020-04-28 ENCOUNTER — Telehealth: Payer: Self-pay | Admitting: Family Medicine

## 2020-04-28 DIAGNOSIS — M810 Age-related osteoporosis without current pathological fracture: Secondary | ICD-10-CM | POA: Diagnosis not present

## 2020-04-28 MED ORDER — DENOSUMAB 60 MG/ML ~~LOC~~ SOSY
60.0000 mg | PREFILLED_SYRINGE | Freq: Once | SUBCUTANEOUS | Status: AC
  Administered 2020-04-28: 60 mg via SUBCUTANEOUS

## 2020-04-28 NOTE — Telephone Encounter (Signed)
Pt is coming in this afternoon for her Prolia and is supposed to take her Carbidopa-Levodopa this morning and wants to know if this is ok?

## 2020-04-28 NOTE — Telephone Encounter (Signed)
Patient advised.

## 2020-04-28 NOTE — Telephone Encounter (Signed)
handled

## 2020-04-28 NOTE — Telephone Encounter (Signed)
Take all of her regular medications

## 2020-04-30 DIAGNOSIS — R0781 Pleurodynia: Secondary | ICD-10-CM | POA: Diagnosis not present

## 2020-05-03 ENCOUNTER — Other Ambulatory Visit: Payer: Self-pay

## 2020-05-03 ENCOUNTER — Other Ambulatory Visit: Payer: Medicare Other | Admitting: *Deleted

## 2020-05-03 DIAGNOSIS — Z515 Encounter for palliative care: Secondary | ICD-10-CM

## 2020-05-04 DIAGNOSIS — F322 Major depressive disorder, single episode, severe without psychotic features: Secondary | ICD-10-CM | POA: Diagnosis not present

## 2020-05-10 ENCOUNTER — Ambulatory Visit (INDEPENDENT_AMBULATORY_CARE_PROVIDER_SITE_OTHER): Payer: Medicare Other | Admitting: Psychology

## 2020-05-10 DIAGNOSIS — F411 Generalized anxiety disorder: Secondary | ICD-10-CM

## 2020-05-11 ENCOUNTER — Ambulatory Visit: Payer: Medicare Other | Admitting: Physical Therapy

## 2020-05-12 ENCOUNTER — Other Ambulatory Visit: Payer: Medicare Other

## 2020-05-13 ENCOUNTER — Ambulatory Visit (INDEPENDENT_AMBULATORY_CARE_PROVIDER_SITE_OTHER): Payer: Medicare Other | Admitting: Family Medicine

## 2020-05-13 ENCOUNTER — Other Ambulatory Visit: Payer: Self-pay

## 2020-05-13 ENCOUNTER — Encounter: Payer: Self-pay | Admitting: Family Medicine

## 2020-05-13 VITALS — BP 128/69 | HR 78 | Ht 59.0 in | Wt 98.0 lb

## 2020-05-13 DIAGNOSIS — F418 Other specified anxiety disorders: Secondary | ICD-10-CM | POA: Diagnosis not present

## 2020-05-13 DIAGNOSIS — G20 Parkinson's disease: Secondary | ICD-10-CM | POA: Diagnosis not present

## 2020-05-13 DIAGNOSIS — R269 Unspecified abnormalities of gait and mobility: Secondary | ICD-10-CM | POA: Diagnosis not present

## 2020-05-13 DIAGNOSIS — Z23 Encounter for immunization: Secondary | ICD-10-CM | POA: Diagnosis not present

## 2020-05-13 NOTE — Patient Instructions (Addendum)
We will continue Sinemet CR 25/100mg  but we are going to decrease the frequency. I would like for you to take 1 tablet when you wake up (around 9am), another tablet around 3pm and then a third dose at bedtime (around 9pm)  Continue close follow up with psychiatry and PCP.   Stay well hydrated. Well balanced diet and regular exercise encouraged.   Follow up with Dr Felecia Shelling in 6 months.    Major Depressive Disorder, Adult Major depressive disorder (MDD) is a mental health condition. MDD often makes you feel sad, hopeless, or helpless. MDD can also cause symptoms in your body. MDD can affect your:  Work.  School.  Relationships.  Other normal activities. MDD can range from mild to very bad. It may occur once (single episode MDD). It can also occur many times (recurrent MDD). The main symptoms of MDD often include:  Feeling sad, depressed, or irritable most of the time.  Loss of interest. MDD symptoms also include:  Sleeping too much or too little.  Eating too much or too little.  A change in your weight.  Feeling tired (fatigue) or having low energy.  Feeling worthless.  Feeling guilty.  Trouble making decisions.  Trouble thinking clearly.  Thoughts of suicide or harming others.  Feeling weak.  Feeling agitated.  Keeping yourself from being around other people (isolation). Follow these instructions at home: Activity  Do these things as told by your doctor: ? Go back to your normal activities. ? Exercise regularly. ? Spend time outdoors. Alcohol  Talk with your doctor about how alcohol can affect your antidepressant medicines.  Do not drink alcohol. Or, limit how much alcohol you drink. ? This means no more than 1 drink a day for nonpregnant women and 2 drinks a day for men. One drink equals one of these:  12 oz of beer.  5 oz of wine.  1 oz of hard liquor. General instructions  Take over-the-counter and prescription medicines only as told by your  doctor.  Eat a healthy diet.  Get plenty of sleep.  Find activities that you enjoy. Make time to do them.  Think about joining a support group. Your doctor may be able to suggest a group for you.  Keep all follow-up visits as told by your doctor. This is important. Where to find more information:  [employer] on Mental Illness: ? www.nami.[place]: ? https://carter.com/  National Suicide Prevention Lifeline: ? 631 728 6319. This is free, 24-hour help. Contact a doctor if:  Your symptoms get worse.  You have new symptoms. Get help right away if:  You self-harm.  You see, hear, taste, smell, or feel things that are not present (hallucinate). If you ever feel like you may hurt yourself or others, or have thoughts about taking your own life, get help right away. You can go to your nearest emergency department or call:  Your local emergency services (911 in the [place]  A suicide crisis helpline, such as the National Suicide Prevention Lifeline: ? 570-835-5734. This is open 24 hours a day. This information is not intended to replace advice given to you by your health care provider. Make sure you discuss any questions you have with your health care provider. Document Revised: 06/22/2017 Document Reviewed: 03/26/2016 Elsevier Patient Education  [place] Disease Parkinson's disease causes problems with movements. It is a long-term condition. It gets worse over time (is progressive). It affects each person in different ways.  It makes it harder for you to:  Control how your body moves.  Move your body normally. The condition can range from mild to very bad (advanced). What are the causes? This condition results from a loss of brain cells called neurons. These brain cells make a chemical called dopamine, which is needed to control body movement. As the condition gets worse, the brain cells make less dopamine.  This makes it hard to move or control your movements. The exact cause of this condition is not known. What increases the risk?  Being female.  Being age 41 or older.  Having family members who had Parkinson's disease.  Having had an injury to the brain.  Being very sad (depressed).  Being around things that are harmful or poisonous. What are the signs or symptoms? Symptoms of this condition can vary. The main symptoms have to do with movement. These include:  A tremor or shaking while you are resting that you cannot control.  Stiffness in your neck, arms, and legs.  Slowing of movement. This may include: ? Losing expressions of the face. ? Having trouble making small movements that are needed to button your clothing or brush your teeth.  Walking in a way that is not normal. You may walk with short, shuffling steps.  Loss of balance when standing. You may sway, fall backward, or have trouble making turns. Other symptoms include:  Being very sad, worried, or confused.  Seeing or hearing things that are not real.  Losing thinking abilities (dementia).  Trouble speaking or swallowing.  Having a hard time pooping (constipation).  Needing to pee right away, peeing often, or not being able to control when you pee or poop.  Sleep problems. How is this treated? There is no cure. The goal of treatment is to manage your symptoms. Treatment may include:  Medicines.  Therapy to help with talking or movement.  Surgery to reduce shaking and other movements that you cannot control. Follow these instructions at home: Medicines  Take over-the-counter and prescription medicines only as told by your doctor.  Avoid taking pain or sleeping medicines. Eating and drinking  Follow instructions from your doctor about what you cannot eat or drink.  Do not drink alcohol. Activity  Talk with your doctor about if it is safe for you to drive.  Do exercises as told by your  doctor. Lifestyle      Put in grab bars and railings in your home. These help to prevent falls.  Do not use any products that contain nicotine or tobacco, such as cigarettes, e-cigarettes, and chewing tobacco. If you need help quitting, ask your doctor.  Join a support group. General instructions  Talk with your doctor about what you need help with and what your safety needs are.  Keep all follow-up visits as told by your doctor, including any therapy visits to help with talking or moving. This is important. Contact a doctor if:  Medicines do not help your symptoms.  You feel off-balance.  You fall at home.  You need more help at home.  You have trouble swallowing.  You have a very hard time pooping.  You have a lot of side effects from your medicines.  You feel very sad, worried, or confused. Get help right away if:  You were hurt in a fall.  You see or hear things that are not real.  You cannot swallow without choking.  You have chest pain or trouble breathing.  You do not feel  safe at home.  You have thoughts about hurting yourself or others. If you ever feel like you may hurt yourself or others, or have thoughts about taking your own life, get help right away. You can go to your nearest emergency department or call:  Your local emergency services (911 in the [place]  A suicide crisis helpline, such as the [place] at 671-281-0844. This is open 24 hours a day. Summary  This condition causes problems with movements.  It is a long-term condition. It gets worse over time.  There is no cure. Treatment focuses on managing your symptoms.  Talk with your doctor about what you need help with and what your safety needs are.  Keep all follow-up visits as told by your doctor. This is important. This information is not intended to replace advice given to you by your health care provider. Make sure you discuss any questions you have  with your health care provider. Document Revised: 09/26/2018 Document Reviewed: 09/26/2018 Elsevier Patient Education  [place].

## 2020-05-13 NOTE — Progress Notes (Signed)
Chief Complaint  Patient presents with  . Tremors  . Follow-up    rm 6     HISTORY OF PRESENT ILLNESS: Today 05/13/20  Tracy Malone is a 86 y.o. female here today for follow up for PD. She continues Sinemet CR 25/100 1 tablet QID.   She feels that she is doing well from a physical standpoint but much more depressed than normal.  Over the past few months she feels that she is having more episodes of crying and feeling sad.  She has had thoughts in the past that she would be better off dead.  She has never attempted suicide and does not have a plan.  She adamantly denies any concerns of committing suicide.  She is followed closely by PCP. Anxiety treated with Xanax 1mg  daily. She is now followed by palliative care. She has had side effects with multiple depression agents including fluoxetine, escitalopram, sertraline, duloxetine and Pristiq. She reports doing fairly well with Wellbutrin She is seeing psychiatry. She has an upcoming appt in November.   She is concerned that Sinemet could be contributing to worsening depression. She feels that depression seems to have worsened over the past three month. She has tried taking Sinemet IR 1/2 tablet 4 times daily but feels that tremor was much worse. Tremor has improved since being back on Sinemet CR,    HISTORY (copied from Dr Garth Bigness note on 02/18/2020)  Tracy Malone is an 86 year old woman with tremor, anxiety/depression and history of L1 fracture.    Update 02/18/20: She is noting more stomach ache and wonders if Sinemet is playing a role (she states GI told her that).   She also wonders if calcium is interacting (theoretical may mildly reduce absorption - not dangerous)  .   When she started Sinemet, gait improved but tremor was not changed.     She had a lumbar ESI.    She does not think it helped much.     She had a UTI and took a short course of cipro but stopped due tp sie effects.     Update 01/08/2020: Since the last  visit, she has been mostly stable.  She has not had any more falls.  She continues to have a tremor and feels it is doing about the same as her last visit.  She is on Sinemet 25/100 every 4 hours while awake (4 pills a day).  On Sinemet, her gait appeared to improve but the tremor did not change much.  Alprazolam 0.5 mg po qAm, one po qPM, 1 mg qHS has helped her tremor some.  She did get some benefit with propranolol for the tremor but she had hypotension and needed to stop.  We had discussed starting low-dose metoprolol.   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   Her handwriting is poor.   Tremor is at rest and with intention.       She was recently hospitalized for a bleeding colitis.   She also has anxiety.Her back pain is better now than earlier this year.   She never took the carisoprodel prescription or any pain med's.   Tylenol has not helped much  Update 08/12/2019: She fractured the L1 vertebrae September 2020 while moving a mattress and had kyphoplasty 04/03/2019.   She feels she got a little better but is still having a lot of pain.  She is doing PT.     She is on Sinemet 25/100 q 4 hours while  awake (4 times a day).   She felt gait improved with sinemet but she still had a tremor.    She feels the xanax has helped the tremors.    Propranolol helped the tremor the most but caused hypotension so she stopped,   She never started the low dose metoprolol.      She is sleeping worse due to the pain.    Methocarbamol was not well tolerated recently.    In the past she could not tolerate Flexeril.    Tramadol was not well tolerated  She also has urinary frequency and constipation, likely related to her PD.     Update 02/06/2019: She continues to have a severe tremor in her right hand but not in the left hand or head.  She feels gait is doing ok.   No recent falls.  She is on Sinemet 25-100 one po tid and alprazolam 0.5 mg qid.     She does not think Sinemet helped the tremor but gait may  be slightly better.     She was hospitalized for a colitis she reports was due to constipation from Sinemet..   She is now also on dicyclomine but had the constipation before that.  She feels the tremor is actually worse 30-90 minutes after each Sinemet and she feels more agitated during that time.    Her tremor had done better on propranolol LA 60 mg but her BP dropped and she felt lightheaded and needed to stop  She has needed bursa injections for hip pain and just had one yesterday.  She is sore and feels she is walking a little worse today.  Alprazolam has helped the anxiety a little bit.   However, she still feels agitated at time during the day.  She reports trying several other medications in the past that did not help the anxiety or were poorly tolerated.  Update 11/28/2017: Her right hand tremor has worsened.  She does not note any significant tremor on the left.  Her tremor will worsen if she is anxious.  She is on Xanax 0.5 mg tid and that helps the tremor some.    She is on Sinemet CR 25/100.   She can tolerate one pill a day but on 2 pills she had a headache.  On 1 pill, she does not note any benefit.  We discussed that that dose is extremely low and would be unlikely to make a difference.    She stumbled some with her gait but has not had falls.  She is concerned about interactions between Wellbutrin and Sinemet.   We discussed that they can be taken at the same time.     She has shoulder pain on her right.   She only got short term benefit from the subacromial bursa injection.    From 03/16/2017: Tremor:   She has a tremor that is worse in the right arm the left arm. She notes it is generally worse when she is aggravated or if she tries to hold still such as holding a cup or spoon to her mouth. She also notes it with writing. Alprazolam has helped the tremor some she takes a low dose 0.25 mg 2-3 times a day.   She first noted a right hand tremor in late 2015.    She notes the tremor  in the right hand much more than the left.      Tremor is not present when asleep.    She has not noted  the tremor in the head.   She has not had any difficulty with her gait or balance. She has had a couple falls but they occurred with tripping. There is no family history of tremors.   Shoulder pain:   The right shoulder pain returned. It is worse when she holds her arm over her head or externally rotates.  Pain is best when she holds the arm into her chest. In the past, we did a subacromial bursa injection and that helped her for 2-3 months. She also received an injection by her orthopedic surgeon that also helped for 2-3 months.   NSAIDs have not helped.  Nodules:   She has a nodule in the right arm.   She has shown her PCP  Mood:  She has noted some crying spells but no laughing spells. The crying spells and sometimes spontaneous often she is sad at that time. The history of depression. She prefers not to be on an antidepressant at this time.     REVIEW OF SYSTEMS: Out of a complete 14 system review of symptoms, the patient complains only of the following symptoms, tremor, depression and all other reviewed systems are negative.   ALLERGIES: Allergies  Allergen Reactions  . Iodine Anaphylaxis    IV and topical forms.  Burnard Leigh [Hyoscyamine Sulfate]     Vision problems/pt has glaucoma  . Salmon [Fish Allergy] Hives and Shortness Of Breath  . Shellfish Allergy Anaphylaxis  . Tramadol Swelling  . Remeron [Mirtazapine] Other (See Comments)    Cause blurred vision and red eyes, pt has glaucoma  . Aspirin Other (See Comments)    Sever stomach pain due to ulcer scaring.  . Ciprofloxacin Diarrhea  . Codeine Nausea And Vomiting  . Darvocet [Propoxyphene N-Acetaminophen] Nausea And Vomiting  . Demerol  [Meperidine Hcl]   . Demerol [Meperidine] Nausea Only  . Dexilant [Dexlansoprazole] Swelling    Redness, swelling and peeling of both feet.  . Diphedryl [Diphenhydramine] Other (See  Comments)    Increased pulse/small amount ok  . Doxycycline Hyclate Other (See Comments)    GI intolerance.  Marland Kitchen Epinephrine Other (See Comments)    Breathing problems  . Erythromycin Other (See Comments)    GI intolerance.  Yvette Rack [Cyclobenzaprine] Other (See Comments)    Tingly/prickly sensation.  Marland Kitchen Keflex [Cephalexin] Hives  . Latex Other (See Comments)    Gloves ok.  Skin gets red from elastic in underwear and latex bandaides.  . Nitrofurantoin Diarrhea  . Other   . Prednisone Other (See Comments)    Headache  . Pylera [Bis Subcit-Metronid-Tetracyc] Swelling    Tongue swelling. Face tingling  . Sulfa Antibiotics Other (See Comments)    Increased pulse, fainting, diarrhea, thrush  . Xylocaine [Lidocaine Hcl]     With epinephrine, given by dentist.  Speeded up heart rate and she passed out (occured twice, at dentist)  . Zoloft [Sertraline Hcl] Swelling and Other (See Comments)    Migraine Swelling of tongue/lip (09/2012)  . Advil [Ibuprofen] Other (See Comments)    Motrin ok with a GI effect.  . Biaxin [Clarithromycin] Rash    Started after completing 10 day course of 2000 mg /day, Lips swelling     HOME MEDICATIONS: Outpatient Medications Prior to Visit  Medication Sig Dispense Refill  . acetaminophen (TYLENOL 8 HOUR) 650 MG CR tablet Take 650 mg by mouth every 8 (eight) hours as needed for pain.     Marland Kitchen ALPRAZolam (XANAX) 1 MG tablet Take 1  mg by mouth at bedtime. Up to 2 tab daily    . b complex vitamins tablet Take 1 tablet by mouth daily.    . calcium carbonate (OSCAL) 1500 (600 Ca) MG TABS tablet Take 600 mg by mouth 2 (two) times daily.    . Carbidopa-Levodopa ER (SINEMET CR) 25-100 MG tablet controlled release Take 1 tablet by mouth in the morning, at noon, in the evening, and at bedtime.     . Cholecalciferol (VITAMIN D) 2000 UNITS tablet Take 2,000 Units by mouth daily.    Marland Kitchen dicyclomine (BENTYL) 10 MG capsule As needed    . Probiotic Product (ALIGN PO) Take 1  capsule by mouth daily.     Marland Kitchen SYNTHROID 25 MCG tablet TAKE 1 TABLET DAILY BEFORE BREAKFAST 90 tablet 0   Facility-Administered Medications Prior to Visit  Medication Dose Route Frequency Provider Last Rate Last Admin  . 0.9 %  sodium chloride infusion  500 mL Intravenous Once Milus Banister, MD         PAST MEDICAL HISTORY: Past Medical History:  Diagnosis Date  . Bell's palsy 1966   Hx: right side facial droop, resolved per patient 04/02/19  . Carotid artery disease ([place]) 2010   on vascular screening;unchanged 2013.(could not tolerate simvastatin, no other statins tried)--<30% blockage bilat 07/2011  . Chronic abdominal pain   . Chronic fatigue and malaise   . Claustrophobia   . Depression    treated in the past for years;stopped in 2010 for a years  . Duodenal ulcer 1962   h/o  . Dysrhythmia    ocassional PVC's, no current problems per patient on 04/02/19  . Fibromyalgia   . Frequent PVCs 07/2012   Seen by [facility]: benign, asymptomatic, normal EF  . GERD (gastroesophageal reflux disease)    diet controlled  . Glaucoma, narrow-angle    s/p laser surgery  . History of hiatal hernia    during endoscopy  . Hypothyroid 9/08  . IBS (irritable bowel syndrome)    Dr. Benson Norway  . Ischemic colitis ([place]) 11/21/2018   no current problems per patient on 04/02/19  . Ocular migraine   . Osteoporosis 10/11   Dr.Hawkes  . Panic attack   . Parkinson disease ([place])   . Parkinson's disease ([place]) 06/23/2016  . Recurrent UTI    has cystocele-Dr.Grewal  . Shingles 1999   h/o  . Superficial thrombophlebitis 03/2009   RLE  . Trochanteric bursitis 12/2008   bilateral     PAST SURGICAL HISTORY: Past Surgical History:  Procedure Laterality Date  . ABDOMINAL HYSTERECTOMY    . CATARACT EXTRACTION, BILATERAL  1995, 1996  . EYE SURGERY Bilateral    laser - glaucoma  . Flexible sigmoidoscopy    . KYPHOPLASTY N/A 04/03/2019   Procedure: KYPHOPLASTY L1;  Surgeon: Melina Schools, MD;   Location: [facility];  Service: Orthopedics;  Laterality: N/A;  90 mins  . THYROIDECTOMY, PARTIAL  09/2005   L nodule; Dr. Harlow Asa  . TONSILLECTOMY  1946  . UPPER GI ENDOSCOPY  06/27/12  . VAGINAL HYSTERECTOMY  1971   and bladder repair.  Still has ovaries  . WISDOM TOOTH EXTRACTION       FAMILY HISTORY: Family History  Problem Relation Age of Onset  . Heart disease Mother   . Hypertension Mother   . Hypertension Sister   . HIV Son   . Heart disease Brother   . Lung cancer Brother        lung  .  Diabetes Maternal Grandfather   . Diabetes Granddaughter        type 1  . Colon cancer Neg Hx   . Esophageal cancer Neg Hx   . Prostate cancer Neg Hx   . Stomach cancer Neg Hx   . Rectal cancer Neg Hx      SOCIAL HISTORY: Social History   Socioeconomic History  . Marital status: Married    Spouse name: Gwyndolyn Saxon  . Number of children: 2  . Years of education: Not on file  . Highest education level: Not on file  Occupational History  . Occupation: retired (school system)  Tobacco Use  . Smoking status: Never Smoker  . Smokeless tobacco: Never Used  Vaping Use  . Vaping Use: Never used  Substance and Sexual Activity  . Alcohol use: No    Alcohol/week: 0.0 standard drinks  . Drug use: No  . Sexual activity: Not Currently    Partners: Male    Birth control/protection: Other-see comments    Comment: Hysterectomy  Other Topics Concern  . Not on file  Social History Narrative   Married.  Son lives in [place]; Daughter [name] lives in [place]; 2 grandchildren   Social Determinants of Health   Financial Resource Strain:   . Difficulty of Paying Living Expenses: Not on file  Food Insecurity:   . Worried About Charity fundraiser in the Last Year: Not on file  . Ran Out of Food in the Last Year: Not on file  Transportation Needs:   . Lack of Transportation (Medical): Not on file  . Lack of Transportation (Non-Medical): Not on file  Physical Activity:   . Days of Exercise per  Week: Not on file  . Minutes of Exercise per Session: Not on file  Stress:   . Feeling of Stress : Not on file  Social Connections:   . Frequency of Communication with Friends and Family: Not on file  . Frequency of Social Gatherings with Friends and Family: Not on file  . Attends Religious Services: Not on file  . Active Member of Clubs or Organizations: Not on file  . Attends Archivist Meetings: Not on file  . Marital Status: Not on file  Intimate Partner Violence:   . Fear of Current or Ex-Partner: Not on file  . Emotionally Abused: Not on file  . Physically Abused: Not on file  . Sexually Abused: Not on file      PHYSICAL EXAM  Vitals:   05/13/20 1024  BP: 128/69  Pulse: 78  Weight: 98 lb (44.5 kg)  Height: 4\' 11"  (1.499 m)   Body mass index is 19.79 kg/m.   Generalized: Well developed, in no acute distress   Neurological examination  Mentation: Alert oriented to time, place, history taking. Follows all commands speech and language fluent Cranial nerve II-XII: Pupils were equal round reactive to light. Extraocular movements were full, visual field were full on confrontational test. Facial sensation and strength were normal. Uvula tongue midline. Head turning and shoulder shrug  were normal and symmetric. Motor: The motor testing reveals 5 over 5 strength of all 4 extremities. Good symmetric motor tone is noted throughout. No tremor noted today  Sensory: Sensory testing is intact to soft touch on all 4 extremities. No evidence of extinction is noted.  Coordination: Cerebellar testing reveals good finger-nose-finger and heel-to-shin bilaterally.  Gait and station: Gait is stable with walker, short steps, tandem not attempted Reflexes: Deep tendon reflexes are symmetric and normal  bilaterally.     DIAGNOSTIC DATA (LABS, IMAGING, TESTING) - I reviewed patient records, labs, notes, testing and imaging myself where available.  Lab Results  Component Value  Date   WBC 8.7 03/16/2020   HGB 14.3 03/16/2020   HCT 45.5 03/16/2020   MCV 93.8 03/16/2020   PLT 316 03/16/2020      Component Value Date/Time   NA 142 03/16/2020 1153   NA 141 09/03/2019 1117   K 4.2 03/16/2020 1153   CL 106 03/16/2020 1153   CO2 27 03/16/2020 1153   GLUCOSE 97 03/16/2020 1153   BUN 10 03/16/2020 1153   BUN 14 09/03/2019 1117   CREATININE 0.62 03/16/2020 1153   CREATININE 0.75 10/22/2018 1401   CREATININE 0.67 11/23/2016 0815   CALCIUM 9.6 03/16/2020 1153   PROT 6.8 09/03/2019 1117   ALBUMIN 4.2 09/03/2019 1117   AST 19 09/03/2019 1117   AST 17 10/22/2018 1401   ALT 4 09/03/2019 1117   ALT 7 10/22/2018 1401   ALKPHOS 83 09/03/2019 1117   BILITOT 1.0 09/03/2019 1117   BILITOT 0.8 10/22/2018 1401   GFRNONAA >60 03/16/2020 1153   GFRNONAA >60 10/22/2018 1401   GFRAA >60 03/16/2020 1153   GFRAA >60 10/22/2018 1401   Lab Results  Component Value Date   CHOL 233 (H) 08/29/2018   HDL 91 08/29/2018   LDLCALC 116 (H) 08/29/2018   TRIG 128 08/29/2018   CHOLHDL 2.6 08/29/2018   Lab Results  Component Value Date   HGBA1C 5.5 01/23/2018   No results found for: VITAMINB12 Lab Results  Component Value Date   TSH 2.310 01/22/2020      ASSESSMENT AND PLAN  86 y.o. year old female  has a past medical history of Bell's palsy (1966), Carotid artery disease ([place]) (2010), Chronic abdominal pain, Chronic fatigue and malaise, Claustrophobia, Depression, Duodenal ulcer (1962), Dysrhythmia, Fibromyalgia, Frequent PVCs (07/2012), GERD (gastroesophageal reflux disease), Glaucoma, narrow-angle, History of hiatal hernia, Hypothyroid (9/08), IBS (irritable bowel syndrome), Ischemic colitis ([place]) (11/21/2018), Ocular migraine, Osteoporosis (10/11), Panic attack, Parkinson disease ([place]), Parkinson's disease ([place]) (06/23/2016), Recurrent UTI, Shingles (1999), Superficial thrombophlebitis (03/2009), and Trochanteric bursitis (12/2008). here with   Parkinson's disease  ([place])  Gait disturbance  Depression with anxiety  Jerzy reports that she has had much more difficulty with depression over the past 3 months.  She is very concerned that is related to dosing of Sinemet.  She was unable to tolerate lower doses of Sinemet IR.  I will have her decrease Sinemet CR dosing to 3 times daily.  She will take her medication approximately 6 h apart.  I will have her monitor tremor and depression very closely.  She has follow-up with psychiatry in 2 weeks.  She is adamant that she does not have a plan nor would she attempt to harm herself.  I have given her educational materials in her AVS which includes emergency hotline should depression worsen or if she has any concerns of suicidal ideations.  She is living with her husband who is able to keep an eye on her.  She will call me with any worsening concerns.  I will have her follow-up in 4 months with [name].  She verbalizes understanding and agreement with this plan.   I spent 40 minutes of face-to-face and non-face-to-face time with patient.  This included previsit chart review, lab review, study review, order entry, electronic health record documentation, patient education.    Debbora Presto, MSN, FNP-C 05/13/2020, 10:42 AM  [facility] [address] [place], [place] [address] [phone]

## 2020-05-13 NOTE — Progress Notes (Signed)
I have read the note, and I agree with the clinical assessment and plan.  Briannah Lona A. Legend Tumminello, MD, PhD, FAAN Certified in Neurology, Clinical Neurophysiology, Sleep Medicine, Pain Medicine and Neuroimaging  [facility] [address], [address] [place], [place] [address] [phone]  

## 2020-05-15 DIAGNOSIS — F410 Panic disorder [episodic paroxysmal anxiety] without agoraphobia: Secondary | ICD-10-CM | POA: Diagnosis not present

## 2020-05-15 DIAGNOSIS — F5101 Primary insomnia: Secondary | ICD-10-CM | POA: Diagnosis not present

## 2020-05-15 DIAGNOSIS — F332 Major depressive disorder, recurrent severe without psychotic features: Secondary | ICD-10-CM | POA: Diagnosis not present

## 2020-05-18 ENCOUNTER — Encounter: Payer: Medicare Other | Admitting: Physical Therapy

## 2020-05-18 NOTE — Progress Notes (Signed)
COMMUNITY PALLIATIVE CARE RN NOTE  PATIENT NAME: Tracy Malone DOB: 06-05-1932 MRN: [id number]  PRIMARY CARE PROVIDER: [name], MD  RESPONSIBLE PARTY: Tracy Malone (husband) Acct ID - Guarantor Home Phone Work Phone Relationship Acct Type  [id number] Tracy, [phone] Self P/F     [address] [place], [place] [address]   Covid-19 Pre-screening Negative  PLAN OF CARE and INTERVENTION:  1. ADVANCE CARE PLANNING/GOALS OF CARE: Goal is for patient to remain at home with her husband. She is a Full Code. 2. PATIENT/CAREGIVER EDUCATION: Symptom management, safe mobility/transfers, fall prevention, s/s of infection 3. DISEASE STATUS: PRN RN visit made per patient's request. She is very anxious today saying that she is concerned about her husband, Tracy Malone, and requested a visit. Upon arrival, she is lying on the couch awake. She reports that she had a fall on Thursday and has fractured 2 of her ribs. She does experience some pain and unable to take deep breaths. Pain is slowly improving but still present, especially with activity. She states that she stood up from the couch and her left leg gave out all of a sudden causing her to fall. She has bruising noted underneath her chin, and a large bruise on her left upper arm. She was initially using a walker afterward the incident but has since given it to her husband due to his gait becoming more unsteady and increased weakness. He has been in the bed today d/t increased uses with his chronic neck pain. He took a muscle relaxant. Recommended that they get another walker so she could use one as well. She got upset last night, stating that Tracy Malone got up on his own in the middle of the night and did not let her know. She has been assisting him in the bathroom by standing in front of him because he can't bring the walker into the bathroom due to space. Explained that it is unsafe to do this because if he falls, he could fall on her and they  both get injured. She says she does not do this anymore. All and all, she is moving around quite well given her current circumstances. She continues with Xanax to help with her anxiety. Her right arm tremor appears to be slightly worse. She continues on her Carbidopa-Levodopa. She does not like to be left alone and she worries when her husband drives out to the store. Recommended life alert, but she feels that this would not be helpful in the case that she becomes unconscious. Also recommended hiring someone to assist with personal care (patient unable to wash her hair), light housekeeping and meal preparation. Her daughter Tracy Malone and son-in-law was present during visit. Emailed Tracy Malone a list of in home care agencies and resources. She has an appointment with a new Psychiatrist tomorrow and her daughter will take her to this appointment. Will continue to monitor.   HISTORY OF PRESENT ILLNESS:  This is a 86 yo female with a diagnosis of Parkinson'sdisease.She has a h/o GERD, IBS, ischemic colitis, hypothyroidism, essential tremors and osteoporosis.Palliative care team continues to follow patient and will visit monthly and PRN.    CODE STATUS: Full code ADVANCED DIRECTIVES: Y MOST FORM: no  PPS: 50%   (Duration of visit and documentation 60 minutes)   [name], RN BSN

## 2020-05-19 ENCOUNTER — Ambulatory Visit: Payer: Medicare Other | Admitting: Nurse Practitioner

## 2020-05-20 ENCOUNTER — Ambulatory Visit: Payer: Medicare Other | Admitting: Psychology

## 2020-05-21 DIAGNOSIS — M5416 Radiculopathy, lumbar region: Secondary | ICD-10-CM | POA: Diagnosis not present

## 2020-05-26 ENCOUNTER — Encounter: Payer: Medicare Other | Admitting: Physical Therapy

## 2020-05-27 ENCOUNTER — Encounter: Payer: Self-pay | Admitting: Family Medicine

## 2020-05-27 ENCOUNTER — Encounter: Payer: Self-pay | Admitting: Adult Health

## 2020-05-27 ENCOUNTER — Ambulatory Visit (INDEPENDENT_AMBULATORY_CARE_PROVIDER_SITE_OTHER): Payer: Medicare Other | Admitting: Adult Health

## 2020-05-27 ENCOUNTER — Other Ambulatory Visit: Payer: Self-pay

## 2020-05-27 VITALS — BP 133/60 | HR 82 | Ht 59.0 in | Wt 97.0 lb

## 2020-05-27 DIAGNOSIS — G4700 Insomnia, unspecified: Secondary | ICD-10-CM | POA: Diagnosis not present

## 2020-05-27 DIAGNOSIS — F411 Generalized anxiety disorder: Secondary | ICD-10-CM | POA: Diagnosis not present

## 2020-05-27 DIAGNOSIS — F331 Major depressive disorder, recurrent, moderate: Secondary | ICD-10-CM

## 2020-05-27 MED ORDER — ALPRAZOLAM 1 MG PO TABS: 1.0000 mg | ORAL_TABLET | Freq: Two times a day (BID) | ORAL | 2 refills | Status: DC | PRN

## 2020-05-27 NOTE — Progress Notes (Signed)
[facility] MD/PA/NP Initial Note  05/27/2020 12:50 PM Tracy Malone  MRN:  [id number]  Chief Complaint:   HPI:   Accompanied by husband and daughter   Describes mood today as "not to good". Pleasant. Tearful - has periods of tearfulness throughout the day. Mood symptoms - reports depression, anxiety, and irritability. More depressed overall. Stating "I stay anxious and depressed". Describes herself as a Research officer, trade union. Gets thoughts in her head and can't let them go. Has tried various medications to treat mood symptoms, but has not tolerated them. Was seeing Dr Tory Emerald until he ritered recently. She is prescribed Xanax 1mg  BID for anxiety and sleep. She typically does not take 2 tablets daily. Se also has a prescription for Pristiq 25mg  that she has not started. Diagnosed with Parkinson's in 2017. Started on Carba-Dopa Levodopa was reduced from 5 tablets to 4 daily on 10/21. Was feeling worse on the medication. Back surgery a year ago. Varying interest and motivation. Taking medications as prescribed.  Energy levels lower. Using a rolling walker. Mostly stationary with recent falls. Active, does not have a regular exercise routine. Enjoys some usual interests and activities. Married. Lives with husband of 77 years. 2 children 59 and 55. Son lives in [place]. Spending time with family. Appetite adequate. Weight loss - 97 pounds.  Sleeping difficulties. Averages 4 hours. Focus and concentration stable. Completing tasks. Managing aspects of household. Retired.  Denies SI or HI.  Denies AH or VH.  Previous medication trials: Multiple medication trials  Visit Diagnosis:    ICD-10-CM   1. Generalized anxiety disorder  F41.1 ALPRAZolam (XANAX) 1 MG tablet  2. Major depressive disorder, recurrent episode, moderate (HCC)  F33.1   3. Insomnia, unspecified type  G47.00     Past Psychiatric History: Denies psychiatric hospitalization. ECT. [place].   Past Medical History:  Past Medical History:  Diagnosis  Date   Bell's palsy 1966   Hx: right side facial droop, resolved per patient 04/02/19   Carotid artery disease ([place]) 2010   on vascular screening;unchanged 2013.(could not tolerate simvastatin, no other statins tried)--<30% blockage bilat 07/2011   Chronic abdominal pain    Chronic fatigue and malaise    Claustrophobia    Depression    treated in the past for years;stopped in 2010 for a years   Duodenal ulcer 1962   h/o   Dysrhythmia    ocassional PVC's, no current problems per patient on 04/02/19   Fibromyalgia    Frequent PVCs 07/2012   Seen by [facility]: benign, asymptomatic, normal EF   GERD (gastroesophageal reflux disease)    diet controlled   Glaucoma, narrow-angle    s/p laser surgery   History of hiatal hernia    during endoscopy   Hypothyroid 9/08   IBS (irritable bowel syndrome)    Dr. Benson [place]   Ischemic colitis ([place]) 11/21/2018   no current problems per patient on 04/02/19   Ocular migraine    Osteoporosis 10/11   Dr.Hawkes   Panic attack    Parkinson disease ([place])    Parkinson's disease ([place]) 06/23/2016   Recurrent UTI    has cystocele-Dr.Grewal   Shingles 1999   h/o   Superficial thrombophlebitis 03/2009   RLE   Trochanteric bursitis 12/2008   bilateral    Past Surgical History:  Procedure Laterality Date   ABDOMINAL HYSTERECTOMY     CATARACT EXTRACTION, BILATERAL  1995, 1996   EYE SURGERY Bilateral    laser - glaucoma   Flexible sigmoidoscopy  KYPHOPLASTY N/A 04/03/2019   Procedure: KYPHOPLASTY L1;  Surgeon: Melina Schools, MD;  Location: [facility];  Service: Orthopedics;  Laterality: N/A;  90 mins   THYROIDECTOMY, PARTIAL  09/2005   L nodule; Dr. Harlow Asa   TONSILLECTOMY  1946   UPPER GI ENDOSCOPY  06/27/12   VAGINAL HYSTERECTOMY  1971   and bladder repair.  Still has ovaries   WISDOM TOOTH EXTRACTION      Family Psychiatric History: Mother with depression. Sister with depression - ECT.   Family History:  Family  History  Problem Relation Age of Onset   Heart disease Mother    Hypertension Mother    Hypertension Sister    HIV Son    Heart disease Brother    Lung cancer Brother        lung   Diabetes Maternal Grandfather    Diabetes Granddaughter        type 1   Colon cancer Neg Hx    Esophageal cancer Neg Hx    Prostate cancer Neg Hx    Stomach cancer Neg Hx    Rectal cancer Neg Hx     Social History:  Social History   Socioeconomic History   Marital status: Married    Spouse name: Gwyndolyn Saxon   Number of children: 2   Years of education: Not on file   Highest education level: Not on file  Occupational History   Occupation: retired (school system)  Tobacco Use   Smoking status: Never Smoker   Smokeless tobacco: Never Used  Scientific laboratory technician Use: Never used  Substance and Sexual Activity   Alcohol use: No    Alcohol/week: 0.0 standard drinks   Drug use: No   Sexual activity: Not Currently    Partners: Male    Birth control/protection: Other-see comments    Comment: Hysterectomy  Other Topics Concern   Not on file  Social History Narrative   Married.  Son lives in [place]; Daughter [name] lives in [place]; 2 grandchildren   Social Determinants of Health   Financial Resource Strain:    Difficulty of Paying Living Expenses: Not on file  Food Insecurity:    Worried About Charity fundraiser in the Last Year: Not on file   [employer] of Food in the Last Year: Not on file  Transportation Needs:    Lack of Transportation (Medical): Not on file   Lack of Transportation (Non-Medical): Not on file  Physical Activity:    Days of Exercise per Week: Not on file   Minutes of Exercise per Session: Not on file  Stress:    Feeling of Stress : Not on file  Social Connections:    Frequency of Communication with Friends and Family: Not on file   Frequency of Social Gatherings with Friends and Family: Not on file   Attends Religious Services: Not on file    Active Member of Clubs or Organizations: Not on file   Attends Archivist Meetings: Not on file   Marital Status: Not on file    Allergies:  Allergies  Allergen Reactions   Iodine Anaphylaxis    IV and topical forms.   Levsin [Hyoscyamine Sulfate]     Vision problems/pt has glaucoma   [place]mon [Fish Allergy] Hives and Shortness Of Breath   Shellfish Allergy Anaphylaxis   Tramadol Swelling   Remeron [Mirtazapine] Other (See Comments)    Cause blurred vision and red eyes, pt has glaucoma   Aspirin Other (See Comments)  Sever stomach pain due to ulcer scaring.   Ciprofloxacin Diarrhea   Codeine Nausea And Vomiting   Darvocet [Propoxyphene N-Acetaminophen] Nausea And Vomiting   Demerol  [Meperidine Hcl]    Demerol [Meperidine] Nausea Only   Dexilant [Dexlansoprazole] Swelling    Redness, swelling and peeling of both feet.   Diphedryl [Diphenhydramine] Other (See Comments)    Increased pulse/small amount ok   Doxycycline Hyclate Other (See Comments)    GI intolerance.   Epinephrine Other (See Comments)    Breathing problems   Erythromycin Other (See Comments)    GI intolerance.   Flexeril [Cyclobenzaprine] Other (See Comments)    Tingly/prickly sensation.   Keflex [Cephalexin] Hives   Latex Other (See Comments)    Gloves ok.  Skin gets red from elastic in underwear and latex bandaides.   Nitrofurantoin Diarrhea   Other    Prednisone Other (See Comments)    Headache   Pylera [Bis Subcit-Metronid-Tetracyc] Swelling    Tongue swelling. Face tingling   Sulfa Antibiotics Other (See Comments)    Increased pulse, fainting, diarrhea, thrush   Xylocaine [Lidocaine Hcl]     With epinephrine, given by dentist.  Speeded up heart rate and she passed out (occured twice, at dentist)   Zoloft [Sertraline Hcl] Swelling and Other (See Comments)    Migraine Swelling of tongue/lip (09/2012)   Advil [Ibuprofen] Other (See Comments)    Motrin ok  with a GI effect.   Biaxin [Clarithromycin] Rash    Started after completing 10 day course of 2000 mg /day, Lips swelling    Metabolic Disorder Labs: Lab Results  Component Value Date   HGBA1C 5.5 01/23/2018   No results found for: PROLACTIN Lab Results  Component Value Date   CHOL 233 (H) 08/29/2018   TRIG 128 08/29/2018   HDL 91 08/29/2018   CHOLHDL 2.6 08/29/2018   VLDL 25 11/23/2016   LDLCALC 116 (H) 08/29/2018   LDLCALC 115 (H) 11/23/2016   Lab Results  Component Value Date   TSH 2.310 01/22/2020   TSH 1.970 09/03/2019    Therapeutic Level Labs: No results found for: LITHIUM No results found for: VALPROATE No components found for:  CBMZ  Current Medications: Current Outpatient Medications  Medication Sig Dispense Refill   acetaminophen (TYLENOL 8 HOUR) 650 MG CR tablet Take 650 mg by mouth every 8 (eight) hours as needed for pain.      ALPRAZolam (XANAX) 1 MG tablet Take 1 tablet (1 mg total) by mouth 2 (two) times daily as needed for anxiety. 60 tablet 2   b complex vitamins tablet Take 1 tablet by mouth daily.     calcium carbonate (OSCAL) 1500 (600 Ca) MG TABS tablet Take 600 mg by mouth 2 (two) times daily.     Carbidopa-Levodopa ER (SINEMET CR) 25-100 MG tablet controlled release Take 1 tablet by mouth in the morning, at noon, in the evening, and at bedtime.      Cholecalciferol (VITAMIN D) 2000 UNITS tablet Take 2,000 Units by mouth daily.     dicyclomine (BENTYL) 10 MG capsule As needed     Probiotic Product (ALIGN PO) Take 1 capsule by mouth daily.      SYNTHROID 25 MCG tablet TAKE 1 TABLET DAILY BEFORE BREAKFAST 90 tablet 0   Current Facility-Administered Medications  Medication Dose Route Frequency Provider Last Rate Last Admin   0.9 %  sodium chloride infusion  500 mL Intravenous Once Milus Banister, MD  Medication Side Effects: none  Orders placed this visit:  No orders of the defined types were placed in this  encounter.   Psychiatric Specialty Exam:  Review of Systems  Musculoskeletal: Negative for gait problem.  Neurological: Negative for tremors.  Psychiatric/Behavioral:       Please refer to HPI    Blood pressure 133/60, pulse 82, height 4\' 11"  (1.499 m), weight 97 lb (44 kg).Body mass index is 19.59 kg/m.  General Appearance: Casual, Neat and Well Groomed  Eye Contact:  Good  Speech:  Clear and Coherent and Normal Rate  Volume:  Normal  Mood:  Anxious and Depressed  Affect:  Appropriate and Congruent  Thought Process:  Coherent and Descriptions of Associations: Intact  Orientation:  Full (Time, Place, and Person)  Thought Content: Logical   Suicidal Thoughts:  No  Homicidal Thoughts:  No  Memory:  WNL  Judgement:  Good  Insight:  Good  Psychomotor Activity:  Normal  Concentration:  Concentration: Good  Recall:  Good  Fund of Knowledge: Good  Language: Good  Assets:  Communication Skills Desire for Improvement Financial Resources/Insurance Housing Intimacy Leisure Time Physical Health Resilience Social Support Talents/Skills Transportation Vocational/Educational  ADL's:  Intact  Cognition: WNL  Prognosis:  Good   Screenings:  PHQ2-9     Office Visit from 09/03/2019 in [facility] Patient Outreach Telephone from 12/02/2018 in [facility] Visit from 08/29/2018 in [facility] Visit from 08/27/2017 in [facility] Visit from 09/26/2016 in [facility] for [facility]ious Disease  PHQ-2 Total Score 6 1 4 4  0  PHQ-9 Total Score 17 -- -- -- --      Receiving Psychotherapy: No   Treatment Plan/Recommendations:   Plan:  PDMP reviewed  1. Xanax 1mg  BID 2. Will start the Pristiq 25mg  daily  Read and reviewed note with patient for accuracy.   RTC 4 weeks  Patient advised to contact office with any questions, adverse effects, or acute worsening in signs and symptoms.  Discussed potential  benefits, risk, and side effects of benzodiazepines to include potential risk of tolerance and dependence, as well as possible drowsiness.  Advised patient not to drive if experiencing drowsiness and to take lowest possible effective dose to minimize risk of dependence and tolerance.    Aloha Gell, NP

## 2020-05-28 ENCOUNTER — Other Ambulatory Visit: Payer: Medicare Other | Admitting: *Deleted

## 2020-05-28 DIAGNOSIS — Z515 Encounter for palliative care: Secondary | ICD-10-CM

## 2020-05-30 DIAGNOSIS — Z20822 Contact with and (suspected) exposure to covid-19: Secondary | ICD-10-CM | POA: Diagnosis not present

## 2020-05-31 NOTE — Progress Notes (Signed)
[facility] PALLIATIVE CARE RN NOTE  PATIENT NAME: Tracy Malone DOB: 1932/05/14 MRN: [id number]  PRIMARY CARE PROVIDER: [name], MD  RESPONSIBLE PARTY: Tracy Malone (husband) Acct ID - Guarantor Home Phone Work Phone Relationship Acct Type  [id number] Tracy Malone, Tracy [phone] [id number] Self P/F     [address] [place], [place] [address]   Covid-19 Pre-screening Negative  PLAN OF CARE and INTERVENTION:  1. ADVANCE CARE PLANNING/GOALS OF CARE: Goal is for patient to remain at home with her husband. She is a Full code. 2. PATIENT/CAREGIVER EDUCATION: Symptom management, pain management, safe mobility, s/s of infection 3. DISEASE STATUS: Met with patient and her husband, Tracy Malone, who is also a Palliative care patient in their home. Patient was initially in the bathroom upon my arrival. Husband states that she has been having some issues with constipation. He says she drank some prune juice and has Miralax available. She continues to report pain in her lower back and hips, which radiates down both legs. She feels that the pain in her back and legs worsened after recently receiving a Prolia injection. She also has some discomfort in her left abdomen. She is ambulatory, but appears stiff. She continues with fractured ribs on the left side from a fall a month ago. The bruising to her chin and left arm has resolved. She has an appointment at the end of the month with Dr. Nelva Bush, her Ortho doctor. She states that for her pain, he recommends a nerve block or pain medications and patient is not agreeable in taking medications to control the pain. She had an appointment yesterday with [facility]. They state that Dr. [name] is moving so she had to find a new psychiatrist. She was very pleased with the NP that she met with. She says that her doctor will be Dr. Karene Fry and she will meet with her next time. She is starting a new medication, Prestiq to help with her depressed mood. She was  supposed to start this medication today, but has chosen to start on Monday so if she has any side effects she will be able to get in contact with someone more easily vs over the weekend. Her right hand tremor is worsening. She says the medication that seemed to help most with these was Propanolol, however it caused her BP to drop too low. She remain ambulatory and able to perform ADLs independently, but it is more difficult. Her appetite is variable. She has a difficult time finding foods to eat that won't upset her stomach. She also has a difficult time getting comfortable while sitting on her furniture, so spends a lot of time in bed where she is most comfortable. Will continue to monitor.   HISTORY OF PRESENT ILLNESS: This is a 86 yo female with a diagnosis of Parkinson'sdisease.She has a h/o GERD, IBS, ischemic colitis, hypothyroidism, essential tremors and osteoporosis.Palliative care team continues to follow patient and will visit monthly and PRN.    CODE STATUS: Full code ADVANCED DIRECTIVES: Y MOST FORM: no PPS: 50%   PHYSICAL EXAM:   LUNGS: clear to auscultation  CARDIAC: Cor RRR EXTREMITIES: No edema SKIN: Exposed skin is dry and intact  NEURO: Alert and oriented x 4, varying moods throughout visit (pleasant and tearful), generalized weakness, ambulatory   (Duration of visit and documentation 60 minutes)   Tracy Eastern, RN BSN

## 2020-06-01 ENCOUNTER — Encounter: Payer: Medicare Other | Admitting: Physical Therapy

## 2020-06-01 ENCOUNTER — Ambulatory Visit (INDEPENDENT_AMBULATORY_CARE_PROVIDER_SITE_OTHER): Payer: Medicare Other | Admitting: Psychology

## 2020-06-01 DIAGNOSIS — F411 Generalized anxiety disorder: Secondary | ICD-10-CM

## 2020-06-02 ENCOUNTER — Telehealth: Payer: Self-pay | Admitting: Adult Health

## 2020-06-02 NOTE — Telephone Encounter (Signed)
Please review

## 2020-06-02 NOTE — Telephone Encounter (Signed)
[name], Tracy Malone's daughter, called to report that they are not going to retry the pristiq  Because she is afraid of all the side effect.  They are wondering if Wellbutrin would be an option and are asking if you would send in a prescription for regular Wellbutrin at the lowest dose available.  Send to [facility] on [address] at [address]. Please call [name] at [phone] with any questions.

## 2020-06-03 NOTE — Telephone Encounter (Signed)
Called and spoke with patient daughter.

## 2020-06-07 ENCOUNTER — Other Ambulatory Visit: Payer: Self-pay | Admitting: Family Medicine

## 2020-06-07 DIAGNOSIS — H0100B Unspecified blepharitis left eye, upper and lower eyelids: Secondary | ICD-10-CM | POA: Diagnosis not present

## 2020-06-07 DIAGNOSIS — E039 Hypothyroidism, unspecified: Secondary | ICD-10-CM

## 2020-06-08 ENCOUNTER — Telehealth: Payer: Self-pay | Admitting: Adult Health

## 2020-06-08 DIAGNOSIS — F411 Generalized anxiety disorder: Secondary | ICD-10-CM

## 2020-06-08 MED ORDER — FLUOXETINE HCL 10 MG PO CAPS: 10.0000 mg | ORAL_CAPSULE | Freq: Every day | ORAL | 2 refills | Status: DC

## 2020-06-08 NOTE — Telephone Encounter (Signed)
Will call daughter to discuss.

## 2020-06-08 NOTE — Telephone Encounter (Signed)
Please review

## 2020-06-08 NOTE — Telephone Encounter (Signed)
Noted thanks °

## 2020-06-08 NOTE — Telephone Encounter (Signed)
Daughter, Tracy Malone, called to report that Tracy Malone has been taking the Trintellix for 4 days and it upsets her stomach to the point that she can't even eat.  She wants to go back on the Prozac.  Needs to know how to stop trintellix and restart the Prozac.  Has some Prozac left over from before that she can take just needs proper instructions.

## 2020-06-10 ENCOUNTER — Other Ambulatory Visit: Payer: Self-pay

## 2020-06-10 ENCOUNTER — Ambulatory Visit (INDEPENDENT_AMBULATORY_CARE_PROVIDER_SITE_OTHER): Payer: Medicare Other | Admitting: Psychiatry

## 2020-06-10 DIAGNOSIS — Z85828 Personal history of other malignant neoplasm of skin: Secondary | ICD-10-CM | POA: Diagnosis not present

## 2020-06-10 DIAGNOSIS — F411 Generalized anxiety disorder: Secondary | ICD-10-CM

## 2020-06-10 DIAGNOSIS — L821 Other seborrheic keratosis: Secondary | ICD-10-CM | POA: Diagnosis not present

## 2020-06-10 DIAGNOSIS — L820 Inflamed seborrheic keratosis: Secondary | ICD-10-CM | POA: Diagnosis not present

## 2020-06-10 DIAGNOSIS — D225 Melanocytic nevi of trunk: Secondary | ICD-10-CM | POA: Diagnosis not present

## 2020-06-10 NOTE — Progress Notes (Signed)
[facility] Counselor Initial Adult Exam  Name: Tracy Malone Date: 06/10/2020 MRN: [id number] DOB: [birth year] PCP: [name], MD  Time spent:  60 minutes  2:00pm to 3:00pm   Guardian/Payee:  patient  Paperwork requested:  No   Reason for Visit /Presenting Problem: Depression (main symptom), Tearfulness, regrets about incarcerated son, anxiety, has Parkinson's Disease, anxiety  Mental Status Exam:   Appearance:   Casual     Behavior:  Appropriate, Sharing and Motivated  Motor:  walks with rollater  Speech/Language:   Clear and Coherent  Affect:  anxious, depressed  Mood:  depressed  Thought process:  normal  Thought content:    some rumination  Sensory/Perceptual disturbances:    WNL  Orientation:  oriented to person, place, time/date, situation, day of week, month of year and year  Attention:  Good  Concentration:  Good and Fair  Memory:  Slater of knowledge:   Good  Insight:    Good and Fair  Judgment:   Good  Impulse Control:  Good   Reported Symptoms:  See symptoms above.  Risk Assessment: Danger to Self:  No Self-injurious Behavior: No Danger to Others: No Duty to Warn:no Physical Aggression / Violence:No  Access to Firearms a concern: No  Gang Involvement:No  Patient / guardian was educated about steps to take if suicide or homicide risk level increases between visits: Patient denies any SI or HI. While future psychiatric events cannot be accurately predicted, the patient does not currently require acute inpatient psychiatric care and does not currently meet [facility] involuntary commitment criteria.  Substance Abuse History: Current substance abuse: No     Past Psychiatric History:   Previous psychological history is significant for depression and mom' death Outpatient Providers:names not given from past; current [employer] (med provider) History of Psych Hospitalization: No  Psychological Testing: none   Abuse History: Victim of No., no    Report needed: No. Victim of Neglect:No. Perpetrator of no  Witness / Exposure to Domestic Violence: No   Protective Services Involvement: No  Witness to [employer] Violence:  No   Family History: Patient confirms info below. Family History  Problem Relation Age of Onset  . Heart disease Mother   . Hypertension Mother   . Hypertension Sister   . HIV Son   . Heart disease Brother   . Lung cancer Brother        lung  . Diabetes Maternal Grandfather   . Diabetes Granddaughter        type 1  . Colon cancer Neg Hx   . Esophageal cancer Neg Hx   . Prostate cancer Neg Hx   . Stomach cancer Neg Hx   . Rectal cancer Neg Hx     Living situation: the patient lives with their spouse  Sexual Orientation:  Straight  Relationship Status: married 30 years  Name of spouse / other: n/a             If a parent, number of children / ages: 2 adult kids (daughter in [place], and son in [place]; spouse Adult Scientist, physiological Stress:  No   Income/Employment/Disability: Actor: No   Educational History: Education: high school diploma/GED  Religion/Sprituality/World View:   Protestant  Any cultural differences that may affect / interfere with treatment:  not applicable   Recreation/Hobbies: music  Stressors:Health problems, Parkinson's, fibromyalgia, osteoporosis, gastrointestinal issues  Strengths:  Family, Friends, Church, [place], Conservator, museum/gallery and Able  to Communicate Effectively  Barriers:  myself--letting go and making changes are hard for me.  Legal History: Pending legal issue / charges: The patient has no significant history of legal issues. History of legal issue / charges: n/a  Medical History/Surgical History:Patient confirms info below. Past Medical History:  Diagnosis Date  . Bell's palsy 1966   Hx: right side facial droop, resolved per patient 04/02/19  . Carotid artery disease ([place]) 2010   on vascular  screening;unchanged 2013.(could not tolerate simvastatin, no other statins tried)--<30% blockage bilat 07/2011  . Chronic abdominal pain   . Chronic fatigue and malaise   . Claustrophobia   . Depression    treated in the past for years;stopped in 2010 for a years  . Duodenal ulcer 1962   h/o  . Dysrhythmia    ocassional PVC's, no current problems per patient on 04/02/19  . Fibromyalgia   . Frequent PVCs 07/2012   Seen by [facility]: benign, asymptomatic, normal EF  . GERD (gastroesophageal reflux disease)    diet controlled  . Glaucoma, narrow-angle    s/p laser surgery  . History of hiatal hernia    during endoscopy  . Hypothyroid 9/08  . IBS (irritable bowel syndrome)    Dr. Benson Norway  . Ischemic colitis ([place]) 11/21/2018   no current problems per patient on 04/02/19  . Ocular migraine   . Osteoporosis 10/11   Dr.Hawkes  . Panic attack   . Parkinson disease ([place])   . Parkinson's disease ([place]) 06/23/2016  . Recurrent UTI    has cystocele-Dr.Grewal  . Shingles 1999   h/o  . Superficial thrombophlebitis 03/2009   RLE  . Trochanteric bursitis 12/2008   bilateral    Past Surgical History:  Procedure Laterality Date  . ABDOMINAL HYSTERECTOMY    . CATARACT EXTRACTION, BILATERAL  1995, 1996  . EYE SURGERY Bilateral    laser - glaucoma  . Flexible sigmoidoscopy    . KYPHOPLASTY N/A 04/03/2019   Procedure: KYPHOPLASTY L1;  Surgeon: Melina Schools, MD;  Location: [facility];  Service: Orthopedics;  Laterality: N/A;  90 mins  . THYROIDECTOMY, PARTIAL  09/2005   L nodule; Dr. Harlow Asa  . TONSILLECTOMY  1946  . UPPER GI ENDOSCOPY  06/27/12  . VAGINAL HYSTERECTOMY  1971   and bladder repair.  Still has ovaries  . WISDOM TOOTH EXTRACTION      Medications: Current Outpatient Medications  Medication Sig Dispense Refill  . acetaminophen (TYLENOL 8 HOUR) 650 MG CR tablet Take 650 mg by mouth every 8 (eight) hours as needed for pain.     Marland Kitchen ALPRAZolam (XANAX) 1 MG tablet Take 1 tablet (1 mg  total) by mouth 2 (two) times daily as needed for anxiety. 60 tablet 2  . b complex vitamins tablet Take 1 tablet by mouth daily.    . calcium carbonate (OSCAL) 1500 (600 Ca) MG TABS tablet Take 600 mg by mouth 2 (two) times daily.    . Carbidopa-Levodopa ER (SINEMET CR) 25-100 MG tablet controlled release Take 1 tablet by mouth in the morning, at noon, in the evening, and at bedtime.     . Cholecalciferol (VITAMIN D) 2000 UNITS tablet Take 2,000 Units by mouth daily.    Marland Kitchen dicyclomine (BENTYL) 10 MG capsule As needed    . FLUoxetine (PROZAC) 10 MG capsule Take 1 capsule (10 mg total) by mouth daily. 30 capsule 2  . Probiotic Product (ALIGN PO) Take 1 capsule by mouth daily.     Marland Kitchen  SYNTHROID 25 MCG tablet TAKE 1 TABLET DAILY BEFORE BREAKFAST FOR HYPOTHYROIDISM 90 tablet 0   Current Facility-Administered Medications  Medication Dose Route Frequency Provider Last Rate Last Admin  . 0.9 %  sodium chloride infusion  500 mL Intravenous Once Milus Banister, MD        Allergies  Allergen Reactions  . Iodine Anaphylaxis    IV and topical forms.  Burnard Leigh [Hyoscyamine Sulfate]     Vision problems/pt has glaucoma  . Salmon [Fish Allergy] Hives and Shortness Of Breath  . Shellfish Allergy Anaphylaxis  . Tramadol Swelling  . Remeron [Mirtazapine] Other (See Comments)    Cause blurred vision and red eyes, pt has glaucoma  . Aspirin Other (See Comments)    Sever stomach pain due to ulcer scaring.  . Ciprofloxacin Diarrhea  . Codeine Nausea And Vomiting  . Darvocet [Propoxyphene N-Acetaminophen] Nausea And Vomiting  . Demerol  [Meperidine Hcl]   . Demerol [Meperidine] Nausea Only  . Dexilant [Dexlansoprazole] Swelling    Redness, swelling and peeling of both feet.  . Diphedryl [Diphenhydramine] Other (See Comments)    Increased pulse/small amount ok  . Doxycycline Hyclate Other (See Comments)    GI intolerance.  Marland Kitchen Epinephrine Other (See Comments)    Breathing problems  . Erythromycin Other  (See Comments)    GI intolerance.  Yvette Rack [Cyclobenzaprine] Other (See Comments)    Tingly/prickly sensation.  Marland Kitchen Keflex [Cephalexin] Hives  . Latex Other (See Comments)    Gloves ok.  Skin gets red from elastic in underwear and latex bandaides.  . Nitrofurantoin Diarrhea  . Other   . Prednisone Other (See Comments)    Headache  . Pylera [Bis Subcit-Metronid-Tetracyc] Swelling    Tongue swelling. Face tingling  . Sulfa Antibiotics Other (See Comments)    Increased pulse, fainting, diarrhea, thrush  . Xylocaine [Lidocaine Hcl]     With epinephrine, given by dentist.  Speeded up heart rate and she passed out (occured twice, at dentist)  . Zoloft [Sertraline Hcl] Swelling and Other (See Comments)    Migraine Swelling of tongue/lip (09/2012)  . Advil [Ibuprofen] Other (See Comments)    Motrin ok with a GI effect.  . Biaxin [Clarithromycin] Rash    Started after completing 10 day course of 2000 mg /day, Lips swelling    Diagnoses:    ICD-10-CM   1. Generalized anxiety disorder  F41.1     Plan of Care:  Patient not signing treatment plan on computer screen due to COVID.  Treatment Goals: Treatment goals will remain on treatment plan as patient works with strategies to achieve her goals.  Progress will be noted each session and documented in the "progress" section of note.  Long term goal: Develop the ability to recognize, accept, and cope with feelings of depression.   Short term goal: Verbalize any unresolved grief issues that may be contributing to depression.  Strategy: Replace negative self-defeating self talk with verbalization of realistic and positive cognitive messages to lessen depression and improve mood.  Progress: Today was patient's first appointment for therapy.  She states she is wanting to be followed in therapy at the same location where her medication is managed which is here at [facility].  We completed her initial evaluation and then  formulated her treatment goal plan.  Please see notes above notes for more detailed information on family and patient history.  Below are some statements made by patient indicating what she feels are some of her need  areas:  I'd like to change how I'm feeling (depression and anxiousness, frustration).  Being able to reframe what things are or mean. I focus on negatives Doesn't feel capable of changing. Negativity, State " I can't" a lot instead of "I can or I can try" Needing to be more open to her being capable of doing some things and of some changes (Another isue) Son incarcerated re: computer-  [place]  Goal review.  Next visit within 2-3 weeks.   Shanon Ace, LCSW

## 2020-06-14 DIAGNOSIS — M5416 Radiculopathy, lumbar region: Secondary | ICD-10-CM | POA: Diagnosis not present

## 2020-06-27 ENCOUNTER — Encounter: Payer: Self-pay | Admitting: Family Medicine

## 2020-06-27 ENCOUNTER — Encounter: Payer: Self-pay | Admitting: *Deleted

## 2020-06-28 ENCOUNTER — Ambulatory Visit: Payer: Medicare Other | Admitting: Psychology

## 2020-06-30 ENCOUNTER — Ambulatory Visit: Payer: Medicare Other | Admitting: Neurology

## 2020-06-30 ENCOUNTER — Telehealth: Payer: Self-pay | Admitting: *Deleted

## 2020-06-30 NOTE — Telephone Encounter (Signed)
Called and spoke with patient to schedule a palliative care home visit. Visit scheduled for 07/02/20 @1 :30p.

## 2020-07-02 ENCOUNTER — Other Ambulatory Visit: Payer: Self-pay

## 2020-07-02 ENCOUNTER — Other Ambulatory Visit: Payer: Medicare Other | Admitting: *Deleted

## 2020-07-02 DIAGNOSIS — Z515 Encounter for palliative care: Secondary | ICD-10-CM

## 2020-07-05 ENCOUNTER — Ambulatory Visit (INDEPENDENT_AMBULATORY_CARE_PROVIDER_SITE_OTHER): Payer: Medicare Other | Admitting: Psychiatry

## 2020-07-05 ENCOUNTER — Other Ambulatory Visit: Payer: Self-pay

## 2020-07-05 DIAGNOSIS — F411 Generalized anxiety disorder: Secondary | ICD-10-CM

## 2020-07-05 NOTE — Progress Notes (Signed)
[facility] Counselor/Therapist Progress Note  Patient ID: Tracy Malone, MRN: [id number],    Date: 07/05/2020  Time Spent: 50 minutes  3:00pm to 3:50pm  Treatment Type: Individual Therapy  Reported Symptoms: anxiety, sometimes depression, "I get agitated sometimes, when I get agitated I get cranky, and when I get cranky I cry."   Mental Status Exam:  Appearance:   Casual     Behavior:  Appropriate, Sharing and Motivated  Motor:  uses a "rollater" to get around better  Speech/Language:   Clear and Coherent  Affect:  anxious  Mood:  anxious and some depression  Thought process:  goal directed  Thought content:    WNL  Sensory/Perceptual disturbances:    WNL  Orientation:  oriented to person, place, time/date, situation, day of week, month of year and year  Attention:  Good  Concentration:  Good  Memory:  "some little things I forget"  Fund of knowledge:   Good  Insight:    Good and Fair  Judgment:   Good  Impulse Control:  Good   Risk Assessment: Danger to Self:  No Self-injurious Behavior: No Danger to Others: No Duty to Warn:no Physical Aggression / Violence:No  Access to Firearms a concern: No  Gang Involvement:No   Subjective: Patient today reports anxiety and  some depression.  Interventions: Solution-Oriented/Positive Psychology and Ego-Supportive  Diagnosis:   ICD-10-CM   1. Generalized anxiety disorder  F41.1      Plan of Care:  Patient not signing treatment plan on computer screen due to COVID.  Treatment Goals: Treatment goals will remain on treatment plan as patient works with strategies to achieve her goals.  Progress will be noted each session and documented in the "progress" section of note.  Long term goal: Develop the ability to recognize, accept, and cope with feelings of depression.   Short term goal: Verbalize any unresolved grief issues that may be contributing to depression.  Strategy: Replace negative self-defeating  self talk with verbalization of realistic and positive cognitive messages to lessen depression and improve mood.  Progress: Patient in today reporting her thoughts often go to the past and it's usually about things that creates guilt, and she shared several examples. Shared her feelings and thoughts about her son's incarceration and how difficult this has been on her emotionally. Also shared some other examples of other long-term guilt and other sadnesses. Talked about her tendency to focus on the negatives more than positives and is willing to work on changing to focus on the positives. Worked in session today on being more aware of her anxious/fearful thinking patterns and how to try and interrupt them, then replace with more positive and encouraging thoughts.  Practiced this with 2 examples of anxious/fearful thoughts that she has frequently and patient was able to do this.  To continue working on this between sessions and particularly note any mood changes.  Despite her age, she is very sharp, receptive, and is motivated.  States the following changes she is hoping to achieve:  Change my anxious and fearful thoughts to be more realistic and less threatening. Want to reframe what things mean to me. Want to be less anxious, frustrated, and depressed. Want to focus on positives rather than negatives. More to believe in myself. Want to be less negative and believe I can make changes rather than saying I cannot. Need to be more open to doing things differently. Understand that I cannot change situation for my son who is incarcerated.  Goal review and progress/challenges noted for patient.  Next appointment within 2 weeks.   Shanon Ace, LCSW

## 2020-07-06 ENCOUNTER — Telehealth: Payer: Self-pay | Admitting: Adult Health

## 2020-07-06 DIAGNOSIS — F331 Major depressive disorder, recurrent, moderate: Secondary | ICD-10-CM

## 2020-07-06 DIAGNOSIS — M5459 Other low back pain: Secondary | ICD-10-CM | POA: Diagnosis not present

## 2020-07-06 MED ORDER — BUPROPION HCL ER (SR) 100 MG PO TB12: 100.0000 mg | ORAL_TABLET | Freq: Every morning | ORAL | 2 refills | Status: DC

## 2020-07-06 NOTE — Telephone Encounter (Signed)
Called and spoke with patient. Has been taking Prozac 10mg  - nausea and upset stomach. Will discontinue.  Add Wellbutrin SR 100mg . Denies seizure history.

## 2020-07-06 NOTE — Telephone Encounter (Signed)
Pt called and left a message that she has been taking the prozac and she is having stomach issues, no appetitie and diarrhea. She said that she had forgotten but this happened the last time she was onthis medicine. She said she would like to try wellbutrin instead. Please give her a call after 4:30 at [phone]

## 2020-07-07 ENCOUNTER — Telehealth: Payer: Self-pay | Admitting: Adult Health

## 2020-07-07 ENCOUNTER — Other Ambulatory Visit: Payer: Self-pay | Admitting: Adult Health

## 2020-07-07 NOTE — Telephone Encounter (Signed)
Returned call to patient - discussed Wellbutrin.

## 2020-07-07 NOTE — Telephone Encounter (Signed)
Pt left a message that she wants to talk to gina about the welbutrin. Please call her at [phone]

## 2020-07-09 ENCOUNTER — Ambulatory Visit: Payer: Medicare Other | Admitting: Physician Assistant

## 2020-07-12 ENCOUNTER — Ambulatory Visit (INDEPENDENT_AMBULATORY_CARE_PROVIDER_SITE_OTHER): Payer: Medicare Other | Admitting: Family Medicine

## 2020-07-12 ENCOUNTER — Other Ambulatory Visit: Payer: Self-pay

## 2020-07-12 ENCOUNTER — Encounter: Payer: Self-pay | Admitting: Family Medicine

## 2020-07-12 VITALS — BP 140/80 | HR 76 | Ht 59.0 in | Wt 98.4 lb

## 2020-07-12 DIAGNOSIS — Z23 Encounter for immunization: Secondary | ICD-10-CM | POA: Diagnosis not present

## 2020-07-12 DIAGNOSIS — G8929 Other chronic pain: Secondary | ICD-10-CM | POA: Diagnosis not present

## 2020-07-12 DIAGNOSIS — M810 Age-related osteoporosis without current pathological fracture: Secondary | ICD-10-CM

## 2020-07-12 DIAGNOSIS — F332 Major depressive disorder, recurrent severe without psychotic features: Secondary | ICD-10-CM

## 2020-07-12 DIAGNOSIS — M545 Low back pain, unspecified: Secondary | ICD-10-CM | POA: Diagnosis not present

## 2020-07-12 NOTE — Progress Notes (Signed)
Chief Complaint  Patient presents with  . Consult    Wants to discuss whether or not she should get [place] booster.    Patient would like to have booster, and asking about potential reactions, side effects and to discuss whether she should get this. She has the primary vaccines in 08/2019 and didn't have any side effects.  She is in constant pain, so wants to make sure things don't get worse.  She reports her depression has not been well controlled. She will be starting Wellbutrin SR 100mg  just in the mornings, starting tomorrow.  She stopped Prozac due to diarrhea, stopped about 10 days ago. Moods have been "awful", correlated to her pain, and having a lot of low back pain.  Has an appointment in January with Dr. Buddy Duty in January to discuss other options for osteoporosis. She felt that Prolia was causing significant worsening of her back pain, and wants to discuss other treatments.  PMH, PSH, SH reviewed  Outpatient Encounter Medications as of 07/12/2020  Medication Sig Note  . acetaminophen (TYLENOL) 650 MG CR tablet Take 650 mg by mouth every 8 (eight) hours as needed for pain.    Marland Kitchen ALPRAZolam (XANAX) 1 MG tablet Take 1 tablet (1 mg total) by mouth 2 (two) times daily as needed for anxiety.   Marland Kitchen b complex vitamins tablet Take 1 tablet by mouth daily. 07/12/2020: Forgot today  . calcium carbonate (OSCAL) 1500 (600 Ca) MG TABS tablet Take 600 mg by mouth 2 (two) times daily. 07/12/2020: Missed today's dose  . Carbidopa-Levodopa ER (SINEMET CR) 25-100 MG tablet controlled release Take 1 tablet by mouth in the morning, at noon, in the evening, and at bedtime.    . Cholecalciferol (VITAMIN D) 2000 UNITS tablet Take 2,000 Units by mouth daily. 07/12/2020: Missed today's but has been taking daily  . dicyclomine (BENTYL) 10 MG capsule As needed   . Probiotic Product (ALIGN PO) Take 1 capsule by mouth daily. 07/12/2020: Missed today's dose  . SYNTHROID 25 MCG tablet TAKE 1 TABLET DAILY BEFORE  BREAKFAST FOR HYPOTHYROIDISM   . buPROPion (WELLBUTRIN SR) 100 MG 12 hr tablet Take 1 tablet (100 mg total) by mouth in the morning. (Patient not taking: Reported on 07/12/2020)    Facility-Administered Encounter Medications as of 07/12/2020  Medication  . 0.9 %  sodium chloride infusion   Allergies  Allergen Reactions  . Iodine Anaphylaxis    IV and topical forms.  Burnard Leigh [Hyoscyamine Sulfate]     Vision problems/pt has glaucoma  . Salmon [Fish Allergy] Hives and Shortness Of Breath  . Shellfish Allergy Anaphylaxis  . Tramadol Swelling  . Remeron [Mirtazapine] Other (See Comments)    Cause blurred vision and red eyes, pt has glaucoma  . Aspirin Other (See Comments)    Sever stomach pain due to ulcer scaring.  . Ciprofloxacin Diarrhea  . Codeine Nausea And Vomiting  . Darvocet [Propoxyphene N-Acetaminophen] Nausea And Vomiting  . Demerol  [Meperidine Hcl]   . Demerol [Meperidine] Nausea Only  . Dexilant [Dexlansoprazole] Swelling    Redness, swelling and peeling of both feet.  . Diphedryl [Diphenhydramine] Other (See Comments)    Increased pulse/small amount ok  . Doxycycline Hyclate Other (See Comments)    GI intolerance.  Marland Kitchen Epinephrine Other (See Comments)    Breathing problems  . Erythromycin Other (See Comments)    GI intolerance.  Yvette Rack [Cyclobenzaprine] Other (See Comments)    Tingly/prickly sensation.  Marland Kitchen Keflex [Cephalexin] Hives  . Latex  Other (See Comments)    Gloves ok.  Skin gets red from elastic in underwear and latex bandaides.  . Nitrofurantoin Diarrhea  . Other   . Prednisone Other (See Comments)    Headache  . Pylera [Bis Subcit-Metronid-Tetracyc] Swelling    Tongue swelling. Face tingling  . Sulfa Antibiotics Other (See Comments)    Increased pulse, fainting, diarrhea, thrush  . Xylocaine [Lidocaine Hcl]     With epinephrine, given by dentist.  Speeded up heart rate and she passed out (occured twice, at dentist)  . Zoloft [Sertraline Hcl]  Swelling and Other (See Comments)    Migraine Swelling of tongue/lip (09/2012)  . Advil [Ibuprofen] Other (See Comments)    Motrin ok with a GI effect.  . Biaxin [Clarithromycin] Rash    Started after completing 10 day course of 2000 mg /day, Lips swelling   ROS:  Decreased appetite, depression. No weight changes.  Denies any URI symptoms, cough, shortness of breath.  Has a chronic runny nose when she leans forward.  Periodically her left eye gets crusty (saw eye doctor who said nothing was wrong).  Denies headaches or dizziness. Has some pain that radiates from her back around to her left lower stomach (chronic) Back pain, pain in knees, "I hurt everywhere" Injections haven't been helpful, under care of Dr. Nelva Bush.  MRI may be planned.  PHYSICAL EXAM:  BP 140/80   Pulse 76   Ht 4\' 11"  (1.499 m)   Wt 98 lb 6.4 oz (44.6 kg)   LMP  (LMP Unknown)   BMI 19.87 kg/m   Wt Readings from Last 3 Encounters:  07/12/20 98 lb 6.4 oz (44.6 kg)  05/13/20 98 lb (44.5 kg)  03/17/20 98 lb 6.4 oz (44.6 kg)   Thin, elderly female, in no distress She is alert and oriented. She has normal eye contact, speech, hygiene and grooming. She appears depressed, but has full range of affect. Normal gait.   ASSESSMENT/PLAN:  Need for viral immunization - counseled in detail re: risks/SE. She previously was monitored for 30 min post-vaccine, so this was done today as well, to ease anxiety. - Plan: Pfizer SARS-COV-2 Vaccine  Osteoporosis of lumbar spine - reviewed risks of stopping Prolia without starting another/bridging treatment.  She has appt with Dr. Buddy Duty to discuss med options in more detail  Severe recurrent major depression without psychotic features ([place]) - under the care of psych, changing from prozac to wellbutrin. Continues to see therapist as well, through [facility]  Chronic left-sided low back pain, unspecified whether sciatica present - has f/u with Dr Nelva Bush, and reports possibly getting MRI.  Discussed techniques to distract from pain rather than focus on it  Discussed other recommended vaccines-- Counseled re: risks/SE of shingrix, that she should get even with prior shingles infection. Tdap is also due--to wait 2 weeks and get from pharmacy

## 2020-07-12 NOTE — Patient Instructions (Signed)
Please get your TdaP from the pharmacy.  You will need to wait 2 weeks from today, since you got your COVID booster.  Shingrix is recommended also, to get at your convenience (can wait a month, or longer, if preferred, also needs to be 2-4 weeks separate from other vaccines).

## 2020-07-15 IMAGING — CT CT ABD-PELV W/O CM
2 of 4 series · 16 of 46 positions shown, 18 images · non-contrast
Comparison: 02/25/2019.

CLINICAL DATA: Abdominal pain, constipation, bloating x4 days.
Prior kyphoplasty on 04/03/2019.

EXAM:
CT ABDOMEN AND PELVIS WITHOUT CONTRAST
TECHNIQUE: Multidetector CT imaging of the abdomen and pelvis was performed
following the standard protocol without IV contrast.

[Series 2: axial st · axial · 0.60mm/px · z∈[+1120,+1455]mm · 13 of 75 slices shown, 15 images]
[im 4/75  soft-tissue]
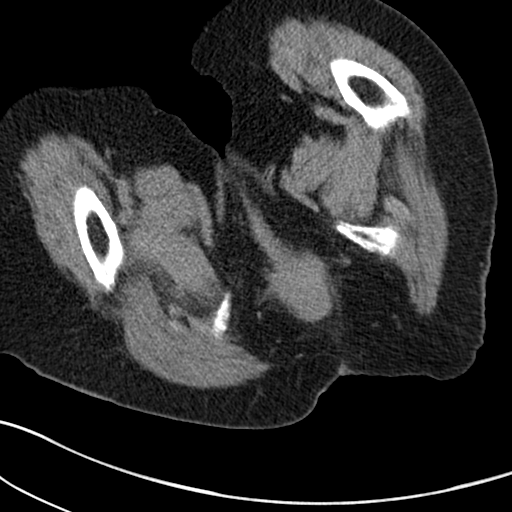
[im 4/75  bone]
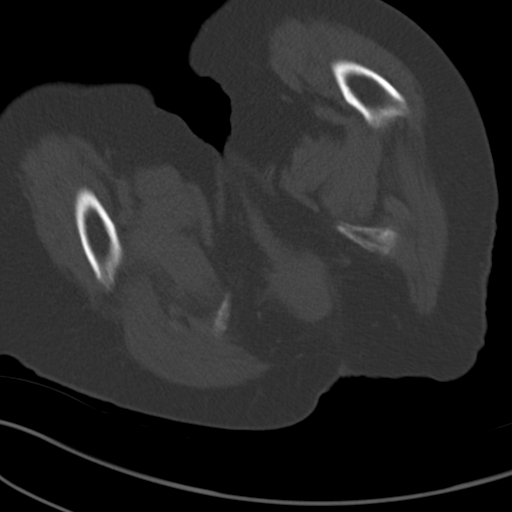
[im 12/75  soft-tissue]
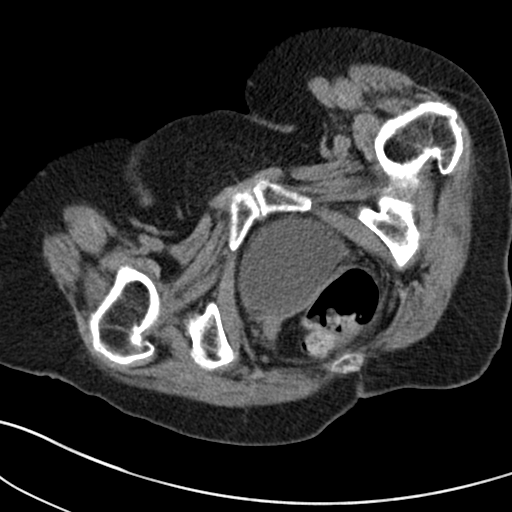
[im 16/75  soft-tissue]
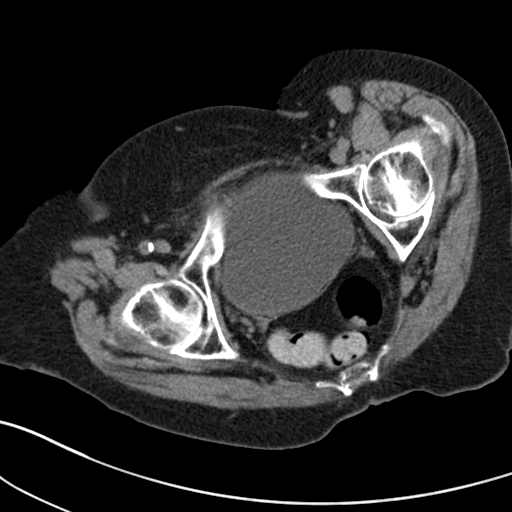
[im 20/75  soft-tissue]
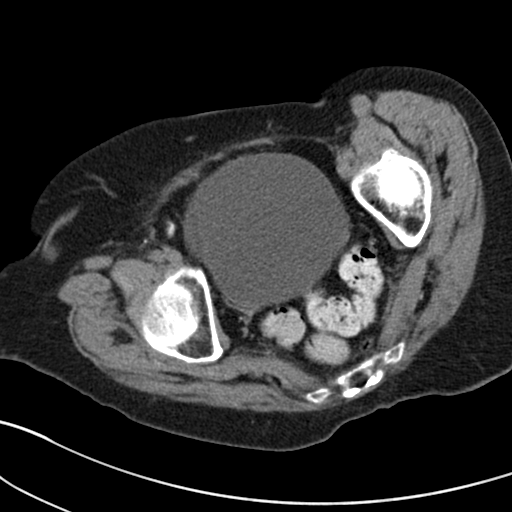
[im 28/75  soft-tissue]
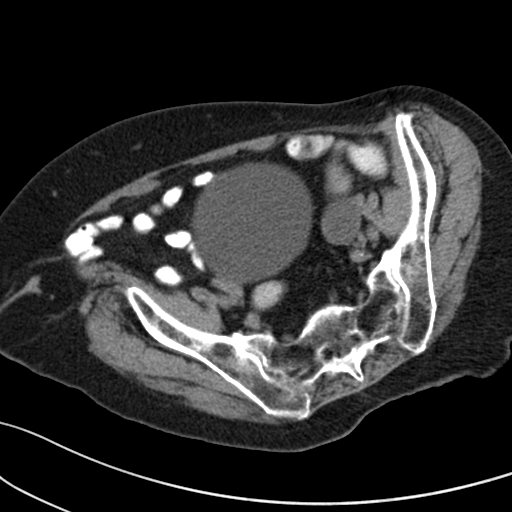
[im 32/75  soft-tissue]
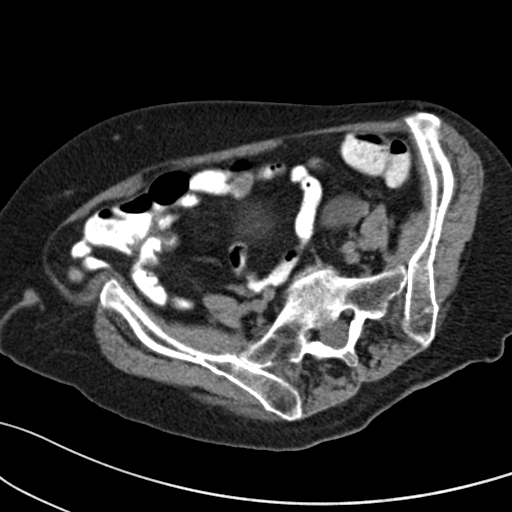
[im 39/75  soft-tissue]
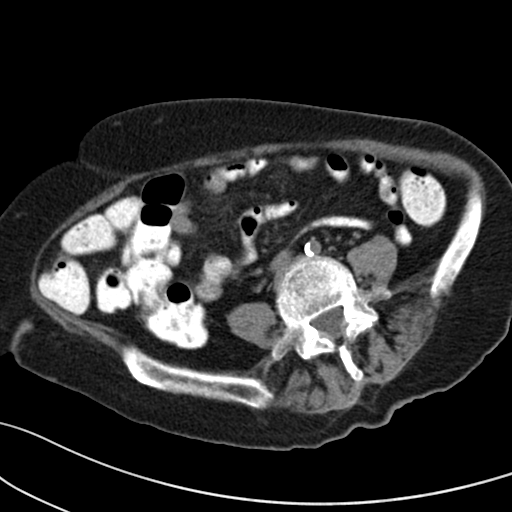
[im 43/75  soft-tissue]
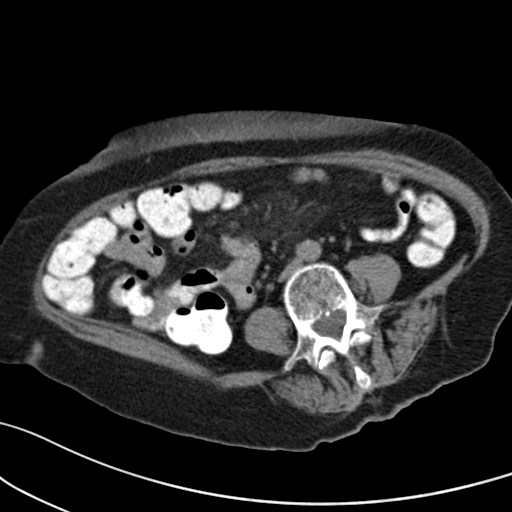
[im 47/75  soft-tissue]
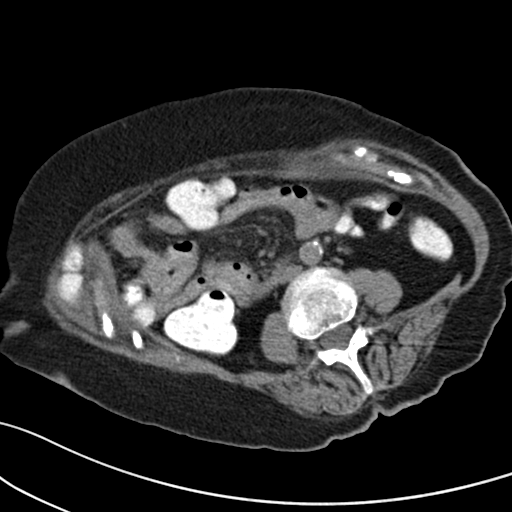
[im 47/75  bone]
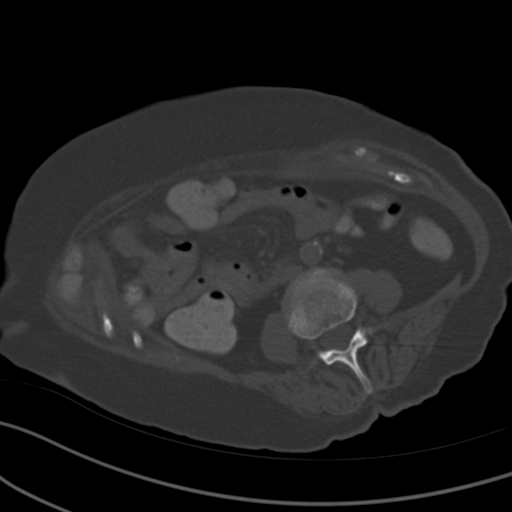
[im 55/75  soft-tissue]
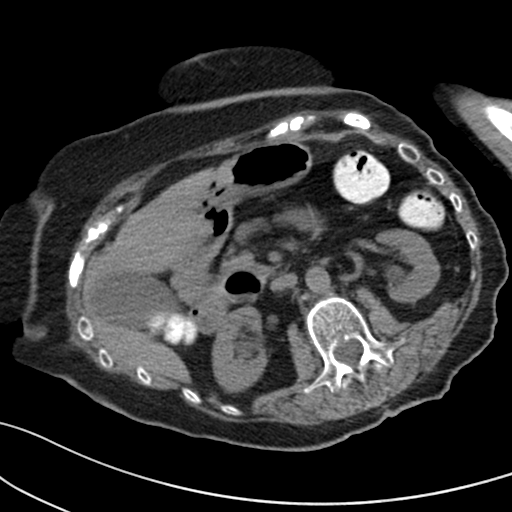
[im 59/75  soft-tissue]
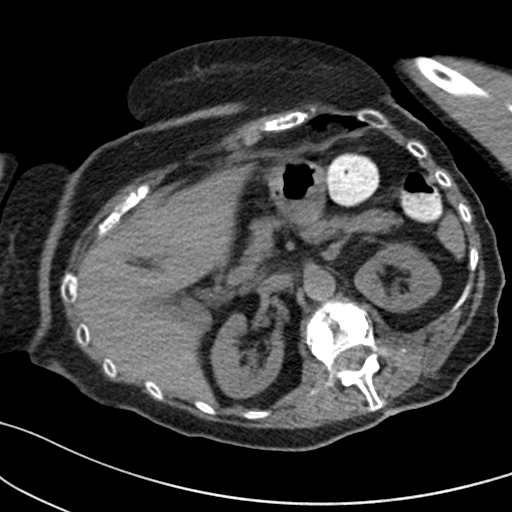
[im 63/75  soft-tissue]
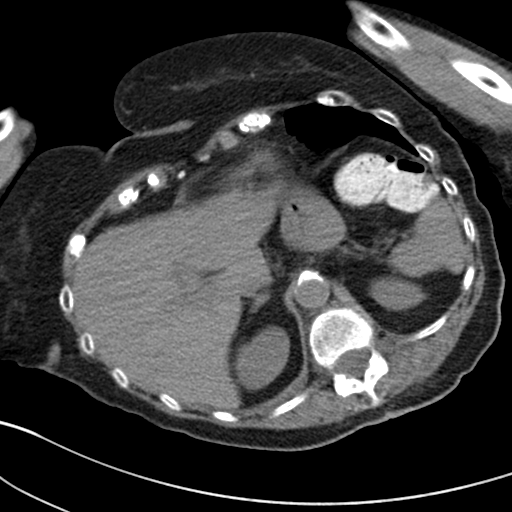
[im 71/75  soft-tissue]
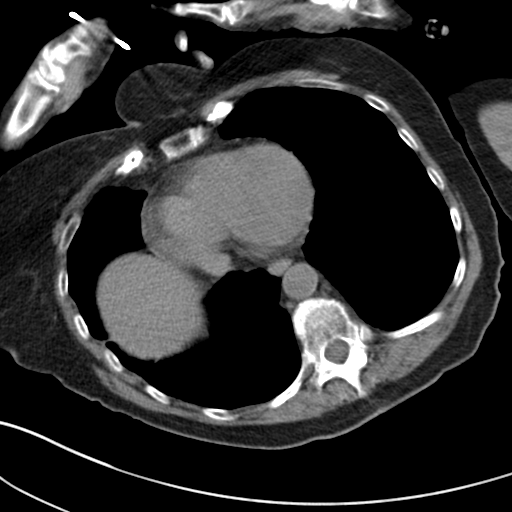

[Series 5: coronal st · coronal · 0.63mm/px · 3 of 111 slices shown]
[im 37/111  soft-tissue]
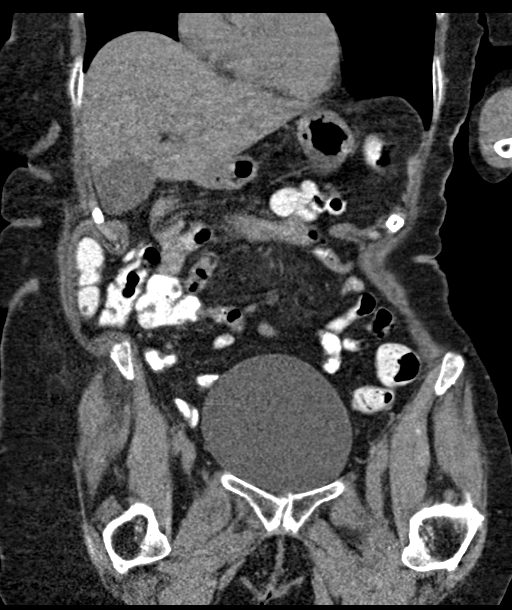
[im 49/111  soft-tissue]
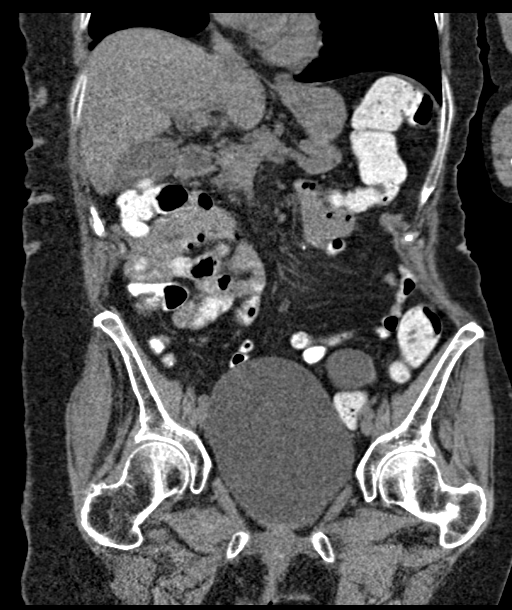
[im 62/111  soft-tissue]
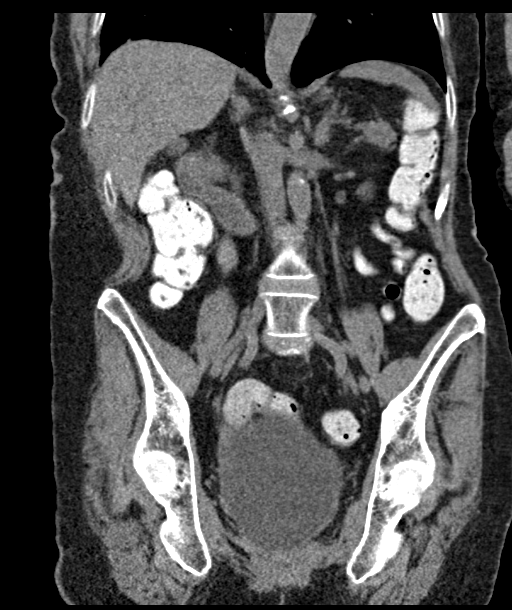

[16 of 46 positions shown; findings below may reference images not displayed]

FINDINGS: Lower chest: Lung bases are clear.

Hepatobiliary: Unenhanced liver is unremarkable.

Gallbladder is unremarkable. No intrahepatic or extrahepatic ductal
dilatation.

Pancreas: Within normal limits.

Spleen: Within normal limits.

Adrenals/Urinary Tract: Adrenal glands are within normal limits.

Kidneys are within normal limits.  No hydronephrosis.

Distended bladder.

Stomach/Bowel: Stomach is within normal limits.

No evidence of bowel obstruction.

Appendix is not discretely visualized.

No colonic wall thickening or mass is evident on CT.

Vascular/Lymphatic: No evidence of abdominal aortic aneurysm.

Atherosclerotic calcifications of the abdominal aorta and branch
vessels.

No suspicious abdominopelvic lymphadenopathy.

Reproductive: Status post hysterectomy.

No adnexal masses.

Other: No abdominopelvic ascites.

Musculoskeletal: Moderate compression fracture deformity at L1 with
prior vertebral augmentation.
IMPRESSION: No CT findings to account for the patient's abdominal pain.

Moderate compression fracture deformity at L1 with prior vertebral
augmentation.

## 2020-07-22 ENCOUNTER — Ambulatory Visit (INDEPENDENT_AMBULATORY_CARE_PROVIDER_SITE_OTHER): Payer: Medicare Other | Admitting: Psychiatry

## 2020-07-22 ENCOUNTER — Other Ambulatory Visit: Payer: Self-pay

## 2020-07-22 ENCOUNTER — Telehealth: Payer: Self-pay | Admitting: Adult Health

## 2020-07-22 DIAGNOSIS — F411 Generalized anxiety disorder: Secondary | ICD-10-CM | POA: Diagnosis not present

## 2020-07-22 NOTE — Telephone Encounter (Signed)
Have her stop the medication.

## 2020-07-22 NOTE — Progress Notes (Signed)
[facility] Counselor/Therapist Progress Note  Patient ID: Tracy Malone, MRN: [id number],    Date: 07/22/2020  Time Spent: 50 minutes   1:00pm to 1:50pm  Treatment Type: Individual Therapy  Reported Symptoms: anxiety, depression  Mental Status Exam:  Appearance:   Neat     Behavior:  Appropriate, Sharing and Motivated  Motor:  slowed; walks with rollater  Speech/Language:   Clear and Coherent  Affect:  anxious, depressed  Mood:  anxious and depressed  Thought process:  goal directed  Thought content:    some tangentiality  Sensory/Perceptual disturbances:    WNL  Orientation:  oriented to person, place, time/date, situation, day of week, month of year and year  Attention:  Good  Concentration:  Good and Fair  Memory:  "a little forgetfulness"  Fund of knowledge:   Good  Insight:    Good and Fair  Judgment:   Good  Impulse Control:  Good   Risk Assessment: Danger to Self:  No Self-injurious Behavior: No Danger to Others: No Duty to Warn:no Physical Aggression / Violence:No  Access to Firearms a concern: No  Gang Involvement:No   Subjective: Patient in today reporting anxiety and depression.  Holidays went "pretty well" but was tiring and had some tough family memories, but only lasted on the holiday.   Interventions: Solution-Oriented/Positive Psychology and Ego-Supportive  Diagnosis:   ICD-10-CM   1. Generalized anxiety disorder  F41.1      Plan of Care: Patient not signing treatment plan on computer screen due to COVID.  Treatment Goals: Treatment goals will remain on treatment plan as patient works with strategies to achieve her goals. Progress will be noted each session and documented in the "progress" section of note.  Long term goal: Develop the ability to recognize, accept, and cope with feelings of depression.   Short term goal: Verbalize any unresolved grief issues that may be contributing to depression.  Strategy: Replace  negative self-defeating self talk with verbalization of realistic and positive cognitive messages to lessen depression and improve mood.  Progress: Patient reporting anxiety, depressed, irritability, moodiness, and I called med provider and her meds were reduced as some of these symptoms were felt to be side effects of her medication.  Has been using her gratitude list daily and finds that helpful.  Also using the positive affirmations with benefit. Patient reports that she is making progress on not going back to the past in her thoughts and has experienced some less guilt.  Did focus some on incarcerated son "which made me more emotional."  Having difficult time looking for positives versus negatives but states she is trying and is sometimes successful.  Still having some early morning tearfulness and "often is not really sad but tears come anyway." Worked more today on her anxious/fearful thoughts and how to interrupt them and replace with more encouraging and empowering thoughts.  Practiced this some more with a couple examples and patient was able to follow through.  "I think I carry a lot of baggage that I don't need to carry" and agrees to keep sharing and working on it in therapy.   Areas that she is working on are listed below: Reviewed with patient Change my anxious and fearful thoughts to be more reality-based and less threatening.  Need to be more open and persistent in doing things differently that will help me. Focus more on positives rather than negatives. Reframing what things mean to me, things that hurt or bother me. Recognize  more quickly my anxious and fearful thoughts and replace them to be more realistic and less threatening. Want to be less frustrated, anxious, and depressed. Believe and myself. Be less negative and believe I can make changes rather than saying I cannot. Understand that I cannot change the situation for my son who is incarcerated.   Goal review and  progress/challenges noted with patient.  Next appointment within 2 to 3 weeks.   Mathis Fare, LCSW

## 2020-07-22 NOTE — Telephone Encounter (Signed)
Pt called and said that she is on the wellbutrin 150 mg. She said that she has parkinsons and her trenors are worse. She also said that is having headaches, she is very anxious and is having panic attacks. She would like to know what you suggest. Please give her a call at [phone]. She will be here at 1 pm to see debbie.

## 2020-07-26 NOTE — Progress Notes (Signed)
[facility] RN NOTE  PATIENT NAME: VALETTA MULROY DOB: 1932/04/02 MRN: [id number]  PRIMARY CARE PROVIDER: [name], MD  RESPONSIBLE PARTY: [name] (husband) Acct ID - Guarantor Home Phone Work Phone Relationship Acct Type  [id number] LESLIEANNE, [phone] Self P/F     [address], [place], [place] [address]   Covid-19 Pre-screening Negative  PLAN OF CARE and INTERVENTION:  1. ADVANCE CARE PLANNING/GOALS OF CARE: Goal is for patient to remain at home with her husband. She is a Full code. 2. PATIENT/CAREGIVER EDUCATION: Symptom management, pain management, safe mobility/transfers, fall prevention, s/s of infection 3. DISEASE STATUS: Met with patient and her husband, [name], who is also a Palliative care patient in their home. Patient continues to report pain mainly in her lower back, hips and legs. She says the pain is worse on her left side. When getting up in the am after sleeping, she reports she can hardly stand. After taking her shower yesterday she was experiencing pain and also after going to the store to look for a comfortable recliner chair. Pain and stiffness does improve once she starts moving. She is treating her pain with Tylenol, ice and heat. She does require the use of a walker at times during ambulation when she is feeling unsteady. She is to see her Ortho doctor next week. She feels that her pain worsened after receiving 2 Prolia injections so she says she will no longer receive these injections. Her mood has improved and she says she doesn't have as many crying spells since starting back on Prozac. She is to meet her new counselor next week at [facility] and is looking forward to this. She says that her appetite has been ok and weight stable. She has good fluid intake. She has intermittent issues with constipation but takes her Miralax daily which is helpful. Will continue to monitor.   HISTORY OF PRESENT ILLNESS: This is a 87 yo female  with a diagnosis of Parkinson'sdisease.She has a h/o GERD, IBS, ischemic colitis, hypothyroidism, essential tremors and osteoporosis.Palliative care team continues to follow patient and will visit monthly and PRN.    CODE STATUS: Full code  ADVANCED DIRECTIVES: Y MOST FORM: no PPS: 50%   PHYSICAL EXAM:   LUNGS: clear to auscultation  CARDIAC: Cor RRR EXTREMITIES: No edema  SKIN: Exposed skin is dry and intact  NEURO: Alert and oriented x 4, right hand tremor, generalized weakness, ambulatory   (Duration of visit and documentation 45 minutes)   [name], RN BSN

## 2020-07-29 ENCOUNTER — Encounter: Payer: Self-pay | Admitting: Nurse Practitioner

## 2020-07-29 ENCOUNTER — Ambulatory Visit (INDEPENDENT_AMBULATORY_CARE_PROVIDER_SITE_OTHER): Payer: Medicare Other | Admitting: Nurse Practitioner

## 2020-07-29 VITALS — BP 116/70 | HR 69 | Ht 59.0 in | Wt 100.0 lb

## 2020-07-29 DIAGNOSIS — R140 Abdominal distension (gaseous): Secondary | ICD-10-CM | POA: Diagnosis not present

## 2020-07-29 NOTE — Progress Notes (Signed)
07/29/2020 Tracy Malone PW:[id number] 07-16-1932   Chief Complaint: abdominal bloat  History of Present Illness: Tracy Malone  is an 87 year old female with a past medical history of Parkinson's disease, recent fall with injury to her left rib cage, back and leg, fibromyalgia and back pain.  See further medical history below.  She presents to our office today for further evaluation regarding ongoing abdominal bloat and intermittent constipation.  Her husband is present.  She continues to feel bloated to her left lower quadrant.  No significant abdominal pain. As previously reviewed at the time of her last office follow-up appointment with Dr. Ardis Hughs 01/13/2020, she underwent 2-3 CT scans in the past 12 months, an EGD and colonoscopy which were unrevealing.  No further GI testing was recommended at that time.  Her bowel pattern varies.  She can go 3 days without passing a bowel movement and she has more abdominal bloat when constipated.  Today she passed 3 bowel movements, the first and second stools were smaller and the third bowel movement was larger and she felt emptied.  No rectal bleeding or melena.  She is lactose intolerance and maintains a dairy free diet.  She has gained 2 pounds over the past 6 months, no weight loss which is reassuring.  CBC Latest Ref Rng & Units 03/16/2020 01/22/2020 09/03/2019  WBC 4.0 - 10.5 K/uL 8.7 7.9 6.9  Hemoglobin 12.0 - 15.0 g/dL 14.3 14.2 14.4  Hematocrit 36.0 - 46.0 % 45.5 42.5 42.5  Platelets 150 - 400 K/uL 316 268 286   CMP Latest Ref Rng & Units 03/16/2020 09/03/2019 04/08/2019  Glucose 70 - 99 mg/dL 97 91 98  BUN 8 - 23 mg/dL 10 14 10   Creatinine 0.44 - 1.00 mg/dL 0.62 0.68 0.59  Sodium 135 - 145 mmol/L 142 141 142  Potassium 3.5 - 5.1 mmol/L 4.2 4.6 3.7  Chloride 98 - 111 mmol/L 106 103 106  CO2 22 - 32 mmol/L 27 25 25   Calcium 8.9 - 10.3 mg/dL 9.6 9.8 9.5  Total Protein 6.0 - 8.5 g/dL - 6.8 7.2  Total Bilirubin 0.0 - 1.2 mg/dL - 1.0 0.7   Alkaline Phos 39 - 117 IU/L - 83 111  AST 0 - 40 IU/L - 19 29  ALT 0 - 32 IU/L - 4 22    EGD April 2021 for generalized abdominal pain, anorexia, nausea and weight loss showed mild nonspecific gastritis.  Pathology showed mild gastritis, no H. pylori.  Colonoscopy April 2021 for generalized abdominal pain, constipation, diarrhea and weight loss showed left-sided diverticulosis but was otherwise completely normal.    Past Medical History:  Diagnosis Date  . Bell's palsy 1966   Hx: right side facial droop, resolved per patient 04/02/19  . Carotid artery disease (Post) 2010   on vascular screening;unchanged 2013.(could not tolerate simvastatin, no other statins tried)--<30% blockage bilat 07/2011  . Chronic abdominal pain   . Chronic fatigue and malaise   . Claustrophobia   . Depression    treated in the past for years;stopped in 2010 for a years  . Duodenal ulcer 1962   h/o  . Dysrhythmia    ocassional PVC's, no current problems per patient on 04/02/19  . Fibromyalgia   . Frequent PVCs 07/2012   Seen by [facility]: benign, asymptomatic, normal EF  . GERD (gastroesophageal reflux disease)    diet controlled  . Glaucoma, narrow-angle    s/p laser surgery  . History of hiatal hernia  during endoscopy  . Hypothyroid 9/08  . IBS (irritable bowel syndrome)    Dr. Elnoria Howard  . Ischemic colitis (HCC) 11/21/2018   no current problems per patient on 04/02/19  . Ocular migraine   . Osteoporosis 10/11   Dr.Hawkes  . Panic attack   . Parkinson disease (HCC)   . Parkinson's disease (HCC) 06/23/2016  . Recurrent UTI    has cystocele-Dr.Grewal  . Shingles 1999   h/o  . Superficial thrombophlebitis 03/2009   RLE  . Trochanteric bursitis 12/2008   bilateral   Current Outpatient Medications on File Prior to Visit  Medication Sig Dispense Refill  . acetaminophen (TYLENOL) 650 MG CR tablet Take 650 mg by mouth every 8 (eight) hours as needed for pain.     Marland Kitchen ALPRAZolam (XANAX) 1 MG tablet  Take 1 tablet (1 mg total) by mouth 2 (two) times daily as needed for anxiety. 60 tablet 2  . b complex vitamins tablet Take 1 tablet by mouth daily.    . calcium carbonate (OSCAL) 1500 (600 Ca) MG TABS tablet Take 600 mg by mouth 2 (two) times daily.    . Carbidopa-Levodopa ER (SINEMET CR) 25-100 MG tablet controlled release Take 1 tablet by mouth in the morning, at noon, in the evening, and at bedtime.     . Cholecalciferol (VITAMIN D) 2000 UNITS tablet Take 2,000 Units by mouth daily.    Marland Kitchen dicyclomine (BENTYL) 10 MG capsule As needed    . Probiotic Product (ALIGN PO) Take 1 capsule by mouth daily.    Marland Kitchen SYNTHROID 25 MCG tablet TAKE 1 TABLET DAILY BEFORE BREAKFAST FOR HYPOTHYROIDISM 90 tablet 0   Current Facility-Administered Medications on File Prior to Visit  Medication Dose Route Frequency Provider Last Rate Last Admin  . 0.9 %  sodium chloride infusion  500 mL Intravenous Once Rachael Fee, MD       Allergies  Allergen Reactions  . Iodine Anaphylaxis    IV and topical forms.  [name] [Hyoscyamine Sulfate]     Vision problems/pt has glaucoma  . Salmon [Fish Allergy] Hives and Shortness Of Breath  . Shellfish Allergy Anaphylaxis  . Tramadol Swelling  . Remeron [Mirtazapine] Other (See Comments)    Cause blurred vision and red eyes, pt has glaucoma  . Aspirin Other (See Comments)    Sever stomach pain due to ulcer scaring.  . Ciprofloxacin Diarrhea  . Codeine Nausea And Vomiting  . Darvocet [Propoxyphene N-Acetaminophen] Nausea And Vomiting  . Demerol  [Meperidine Hcl]   . Demerol [Meperidine] Nausea Only  . Dexilant [Dexlansoprazole] Swelling    Redness, swelling and peeling of both feet.  . Diphedryl [Diphenhydramine] Other (See Comments)    Increased pulse/small amount ok  . Doxycycline Hyclate Other (See Comments)    GI intolerance.  Marland Kitchen Epinephrine Other (See Comments)    Breathing problems  . Erythromycin Other (See Comments)    GI intolerance.  [name]  [Cyclobenzaprine] Other (See Comments)    Tingly/prickly sensation.  Marland Kitchen Keflex [Cephalexin] Hives  . Latex Other (See Comments)    Gloves ok.  Skin gets red from elastic in underwear and latex bandaides.  . Nitrofurantoin Diarrhea  . Other   . Prednisone Other (See Comments)    Headache  . Pylera [Bis Subcit-Metronid-Tetracyc] Swelling    Tongue swelling. Face tingling  . Sulfa Antibiotics Other (See Comments)    Increased pulse, fainting, diarrhea, thrush  . Wellbutrin [Bupropion] Other (See Comments)    Headaches  .  Xylocaine [Lidocaine Hcl]     With epinephrine, given by dentist.  Speeded up heart rate and she passed out (occured twice, at dentist)  . Zoloft [Sertraline Hcl] Swelling and Other (See Comments)    Migraine Swelling of tongue/lip (09/2012)  . Advil [Ibuprofen] Other (See Comments)    Motrin ok with a GI effect.  . Biaxin [Clarithromycin] Rash    Started after completing 10 day course of 2000 mg /day, Lips swelling      Current Medications, Allergies, Past Medical History, Past Surgical History, Family History and Social History were reviewed in [employer] record.   Review of Systems:   Constitutional: Negative for fever, sweats, chills or weight loss.  Respiratory: Negative for shortness of breath.   Cardiovascular: Negative for chest pain, palpitations and leg swelling.  Gastrointestinal: See HPI.  Musculoskeletal: + back pain.  Neurological: + dizziness.    Physical Exam: BP 116/70   Pulse 69   Ht 4\' 11"  (1.499 m)   Wt 100 lb (45.4 kg)   LMP  (LMP Unknown)   BMI 20.20 kg/m    Wt Readings from Last 3 Encounters:  07/29/20 100 lb (45.4 kg)  07/12/20 98 lb 6.4 oz (44.6 kg)  05/13/20 98 lb (44.5 kg)   General: Petite 87 year old female in no acute distress. Head: Normocephalic and atraumatic. Eyes: No scleral icterus. Conjunctiva pink . Ears: Normal auditory acuity. Mouth: Dentition intact. No ulcers or lesions.  Lungs:  Clear throughout to auscultation. Heart: Regular rate and rhythm, no murmur. Abdomen: Soft, very mild LLQ tenderness without rebound or guarding. Nondistended. No masses or hepatomegaly. Normal bowel sounds x 4 quadrants.  Rectal: Deferred.  Musculoskeletal: Symmetrical with no gross deformities. Extremities: No edema. Neurological: Alert oriented x 4. No focal deficits. Parkinsonian tremor note to RUE. Psychological: Alert and cooperative. Normal mood and affect  Assessment and Recommendations:  41.  87 year old female with Parkinson's disease presents with complaints of intermittent constipation and abdominal bloat. -Gas-X 1 tab twice daily -OTC digestive enzyme 1 p.o. twice daily -MiraLAX 1 capful mixed in 8 ounces of water at bedtime if no bowel movement for 24-hour -Follow-up as needed -Patient to call our office if symptoms worsen -Stop Dicyclomine which can increase the risk of falls in the elderly population

## 2020-07-29 NOTE — Patient Instructions (Signed)
1. Take Gas X one tab by mouth twice daily  2. Take Miralax 1 capful mixed in 8 ounces of water at bed time if no bowel movement for 24 hours.   3. Least expensive over the counter digestive enzyme one tab by mouth twice daily  4. Stop Dicyclomine which can increase risk of falls   5. Follow up in office as needed

## 2020-07-29 NOTE — Progress Notes (Signed)
I agree with the above note, plan 

## 2020-07-30 DIAGNOSIS — Z8781 Personal history of (healed) traumatic fracture: Secondary | ICD-10-CM | POA: Diagnosis not present

## 2020-07-30 DIAGNOSIS — M810 Age-related osteoporosis without current pathological fracture: Secondary | ICD-10-CM | POA: Diagnosis not present

## 2020-07-30 DIAGNOSIS — E063 Autoimmune thyroiditis: Secondary | ICD-10-CM | POA: Diagnosis not present

## 2020-07-30 DIAGNOSIS — E039 Hypothyroidism, unspecified: Secondary | ICD-10-CM | POA: Diagnosis not present

## 2020-08-02 ENCOUNTER — Ambulatory Visit (INDEPENDENT_AMBULATORY_CARE_PROVIDER_SITE_OTHER): Payer: Medicare Other | Admitting: Psychiatry

## 2020-08-02 ENCOUNTER — Other Ambulatory Visit: Payer: Self-pay

## 2020-08-02 DIAGNOSIS — F411 Generalized anxiety disorder: Secondary | ICD-10-CM | POA: Diagnosis not present

## 2020-08-02 NOTE — Progress Notes (Signed)
[facility] Counselor/Therapist Progress Note  Patient ID: Tracy Malone, MRN: [id number],    Date: 08/02/2020  Time Spent: 60 minutes   3:00pm to 4:00pm  Treatment Type: Individual Therapy  Reported Symptoms: anxiety, tearfulness, and depression. States anxiety is her strongest symptom, with depression being close behind.  Having back pain and is to have MRI later this week.   Mental Status Exam:  Appearance:   Neat     Behavior:  Appropriate, Sharing and Motivated  Motor:  walks with a rollater  Speech/Language:   Clear and Coherent  Affect:  Anxious, depressed  Mood:  anxious, depressed and sad  Thought process:  some tangentiality  Thought content:    some rumination  Sensory/Perceptual disturbances:    WNL  Orientation:  oriented to person, place, time/date, situation, day of week, month of year and year  Attention:  Good  Concentration:  Good and Fair  Memory:  denies any memory issues  Fund of knowledge:   Good  Insight:    Good and Fair  Judgment:   Good  Impulse Control:  Good   Risk Assessment: Danger to Self:  No Self-injurious Behavior: No Danger to Others: No Duty to Warn:no Physical Aggression / Violence:No  Access to Firearms a concern: No  Gang Involvement:No   Subjective: Patient today reports anxiety, some tearfulness, and depression on a daily basis.  "I had a couple good days this past week."     Interventions: Solution-Oriented/Positive Psychology and Ego-Supportive  Diagnosis:   ICD-10-CM   1. Generalized anxiety disorder  F41.1      Plan of Care: Patient not signing treatment plan on computer screen due to COVID.  Treatment Goals: Treatment goals will remain on treatment plan as patient works with strategies to achieve her goals. Progress will be noted each session and documented in the "progress" section of note.  Long term goal: Develop the ability to recognize, accept, and cope with feelings of depression.  Short  term goal: Verbalize any unresolved grief issues that may be contributing to depression.  Strategy: Replace negative self-defeating self talk with verbalization of realistic and positive cognitive messages to lessen depression and improve mood.  Progress: Patient in today reporting anxiety, tearfulness, and depression. She does report the tearfulness has decreased some in the past couple weeks. Main issue she reports is her adult son being incarcerated and "he is putting in for early release" (incarcerated "due to some computer issues") and hoping he may get out sometime this year. Grieves the fact son is incarcerated which is difficult for patient and we used session today to primarily talk about her grief and anxiety about son's situation.  Does tend to focus heavily on "negatives" and seemed to accept this better today in our discussion. States that she is going to and be more aware of positives and I encouraged her on a daily basis to intentionally look for more positives than negatives.  She shared that she has had some tearful episodes recently but none today so far and there was none during our session today.  She has some good cognitive abilities however with her increased age I am not sure if she is able to truly hang on to some of the cognitive changes that we talked about.  We will see how she does with homework discussed today involve some retaining of different ways of thinking and reframing some of her fearful thoughts.  She is a pleasant lady and motivated.  Does  not have a whole lot of interaction with others outside the home but her mobility limits her some as well as other factors in her situation.  She smiled and added "most of the places I go are to doctor appointments".  Before she left today we did review again the process of interrupting anxious/fearful thoughts and replacing them with more reality-based, encouraging, and empowering thoughts.  She was encouraged to also use this and  having more positive self talk. She does seem to be working through some of the "baggage that I do not need to carry" and agrees to keep working on this in therapy.  Need Areas that she is working on in session and outside of session listed below: Believing in herself.  Believe that I can make some changes rather than saying I cannot. Wanting to be less frustrated, anxious, and depressed. Pay closer attention to her thoughts. Focused much more on positives rather than negatives.  Intentionally look for positives daily. Replace anxious and fearful thoughts to be more reality based and less threatening thoughts. Be more determined and persistent in doing things differently that will help me. Reframe the meaning that things have for me, especially things that hurt and bother me. Understand and accept that I cannot change the situation for my son who is incarcerated.  Goal review and progress/challenges noted with patient.  Next appointment within 2 to 3 weeks.   Shanon Ace, LCSW

## 2020-08-06 ENCOUNTER — Telehealth: Payer: Self-pay | Admitting: *Deleted

## 2020-08-06 DIAGNOSIS — M5459 Other low back pain: Secondary | ICD-10-CM | POA: Diagnosis not present

## 2020-08-06 DIAGNOSIS — M545 Low back pain, unspecified: Secondary | ICD-10-CM | POA: Diagnosis not present

## 2020-08-06 NOTE — Telephone Encounter (Signed)
Called and spoke with patient to schedule a palliative care home visit. Visit scheduled for 08/11/20 @11a  for her and her husband Jann Milkovich, who is also a Palliative care patient.

## 2020-08-11 ENCOUNTER — Other Ambulatory Visit: Payer: Medicare Other | Admitting: *Deleted

## 2020-08-11 ENCOUNTER — Other Ambulatory Visit: Payer: Self-pay

## 2020-08-11 DIAGNOSIS — Z515 Encounter for palliative care: Secondary | ICD-10-CM

## 2020-08-12 DIAGNOSIS — M4856XA Collapsed vertebra, not elsewhere classified, lumbar region, initial encounter for fracture: Secondary | ICD-10-CM | POA: Diagnosis not present

## 2020-08-12 DIAGNOSIS — M5136 Other intervertebral disc degeneration, lumbar region: Secondary | ICD-10-CM | POA: Diagnosis not present

## 2020-08-16 ENCOUNTER — Ambulatory Visit (INDEPENDENT_AMBULATORY_CARE_PROVIDER_SITE_OTHER): Payer: Medicare Other | Admitting: Psychiatry

## 2020-08-16 ENCOUNTER — Other Ambulatory Visit: Payer: Self-pay

## 2020-08-16 DIAGNOSIS — F411 Generalized anxiety disorder: Secondary | ICD-10-CM | POA: Diagnosis not present

## 2020-08-16 NOTE — Progress Notes (Signed)
[facility] Counselor/Therapist Progress Note  Patient ID: Tracy Malone, MRN: [id number],    Date: 08/16/2020  Time Spent: 50 minutes     3:00pm to 3:50pm  Treatment Type: Individual Therapy  Reported Symptoms: Anxiety, some tearfulness (mostly re: incarcerated son), depression. States anxiety is her stronger symptom.  Mental Status Exam:  Appearance:   Neat     Behavior:  Appropriate and Sharing  Motor:  walks with assistance of a rollator  Speech/Language:   Clear and Coherent  Affect:  anxiety, depression,  Mood:  anxious and depressed  Thought process:  goal directed  Thought content:    some ruminating  Sensory/Perceptual disturbances:    WNL  Orientation:  oriented to person, place, time/date, situation, day of week, month of year and year  Attention:  Good  Concentration:  Good and Fair  Memory:  some forgetfulness  Fund of knowledge:   Good  Insight:    Good and Fair  Judgment:   Good  Impulse Control:  Good   Risk Assessment: Danger to Self:  No Self-injurious Behavior: No Danger to Others: No Duty to Warn:no Physical Aggression / Violence:No  Access to Firearms a concern: No  Gang Involvement:No   Subjective: Patient today reports anxiety, some tearfulness about incarcerated son, and depression. States she has become some more hopeful.  States she appreciates coming here and being able to talk through her worries and concerns and feels heard.   Interventions: Solution-Oriented/Positive Psychology and Ego-Supportive  Diagnosis:   ICD-10-CM   1. Generalized anxiety disorder  F41.1     Plan of Care: Patient not signing treatment plan on computer screen due to COVID.  Treatment Goals: Treatment goals will remain on treatment plan as patient works with strategies to achieve her goals. Progress will be noted each session and documented in the "progress" section of note.  Long term goal: Develop the ability to recognize, accept, and cope with  feelings of depression.  Short term goal: Verbalize any unresolved grief issues that may be contributing to depression.  Strategy: Replace negative self-defeating self talk with verbalization of realistic and positive cognitive messages to lessen depression and improve mood.  Progress: Patient in today reporting anxiety, some tearfulness (mostly about incarcerated son), and some depression.  Still having some back problems and is to start physical therapy within couple weeks.  Processed some more of her feelings related to concerns/fears for incarcerated son, "although he may get early release this year." Feels she is managing her feelings about son some better but still some sadness, but "hoping all goes well with his earlier release." Talks of her concern about her husband and his health plus the death of his sister recently. States she is working on not focusing so heavily on the negatives, and intentionally look for positives. Reports she hasn't had many contacts in her neighborhood lately as they've not been outside much due to very cold ad some icy weather. Able to have phone calls with son that is incarcerated in [place] frequently, and also with daughter who lives locally.  Not as much self-defeating self talk today and states that she is improving with more positive self talk, "but it does not always come natural".  Continue to process some of her grief issues about her son, and also difficulty with some of her physical issues as she and her husband grow older, with husband turning 56 in March of this year.  One noticeable difference in session today was that  she was not as tearful.  Reminded her today about the interrupting of anxious/fearful thoughts and replacing them with more reality-based and empowering thoughts.  She is able to understand this but admits that she is not always able to do it, but does put forth effort and is successful at times.  Remains motivated.  Need Areas that she is  working on in session and outside of session listed below: Believing in herself.  Believe that I can make some changes rather than saying I cannot. Wanting to be less frustrated, anxious, and depressed. Focused much more on positives rather than negatives.  Intentionally look for positives daily. Replace anxious and fearful thoughts to be more reality based and less threatening thoughts. Be more determined and persistent in doing things differently that will help me. Reframe the meaning that things have for me, especially things that hurt and bother me. Understand and accept that I cannot change the situation for my son who is incarcerated.  Goal review and progress/challenges noted with patient.  Next appointment within 3 weeks.   Shanon Ace, LCSW

## 2020-08-18 ENCOUNTER — Other Ambulatory Visit: Payer: Self-pay

## 2020-08-18 ENCOUNTER — Encounter: Payer: Self-pay | Admitting: Neurology

## 2020-08-18 ENCOUNTER — Ambulatory Visit (INDEPENDENT_AMBULATORY_CARE_PROVIDER_SITE_OTHER): Payer: Medicare Other | Admitting: Neurology

## 2020-08-18 VITALS — BP 135/70 | HR 73 | Ht 59.0 in | Wt 99.5 lb

## 2020-08-18 DIAGNOSIS — G250 Essential tremor: Secondary | ICD-10-CM | POA: Diagnosis not present

## 2020-08-18 DIAGNOSIS — F418 Other specified anxiety disorders: Secondary | ICD-10-CM

## 2020-08-18 DIAGNOSIS — G20 Parkinson's disease: Secondary | ICD-10-CM | POA: Diagnosis not present

## 2020-08-18 DIAGNOSIS — S32010S Wedge compression fracture of first lumbar vertebra, sequela: Secondary | ICD-10-CM | POA: Diagnosis not present

## 2020-08-18 MED ORDER — PROPRANOLOL HCL 10 MG PO TABS: 10.0000 mg | ORAL_TABLET | Freq: Three times a day (TID) | ORAL | 5 refills | Status: DC

## 2020-08-18 MED ORDER — CARBIDOPA-LEVODOPA ER 25-100 MG PO TBCR
1.0000 | EXTENDED_RELEASE_TABLET | Freq: Four times a day (QID) | ORAL | 11 refills | Status: DC
Start: 2020-08-18 — End: 2020-11-18

## 2020-08-18 NOTE — Progress Notes (Signed)
[facility]  PATIENT: Tracy Malone DOB: [birth year]  REFERRING DOCTOR OR PCP:  Joselyn Arrow SOURCE: patient and EMR records  _________________________________   HISTORICAL  CHIEF COMPLAINT:  Chief Complaint  Patient presents with  . Follow-up    RM 13 with husband. Last seen 05/13/20 by AL,NP. Takes sinemet for PD. Could not go 6hr between doses. Went back to 4.5hr( takes at morning, lunchtime and dinner). Follow psychiatry for depression. Going to [facility] now (Debbie Dowd-therapist and [facility] care of medications for her there). Ambulating with rolling walker. On Oct 8th- fell and fractured ribs on left side. Did hit head.     HISTORY OF PRESENT ILLNESS:  Tracy Malone is an 87 year old woman with tremor, anxiety/depression and history of L1 fracture.    Update 08/18/2020: She feels mostly stable.  She had a fall after waking up on the couch and getting up to go to bed.  She fractured 2 ribs.    Tremor is present while resting but gets worse when she writes.. It is also worse when anxious.  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   She is trying Sinemet CR 25/100 every 4 hours while awake (4 pills a day).  On Sinemet, her gait appeared to improve but the tremor did not change much.  Alprazolam 0.5 mg po qAm,   1 mg qHS has helped her tremor some.  She did get a lot of benefit with propranolol for the tremor but she had hypotension and needed to stop.      Her handwriting is poor.   Tremor is at rest and with intention.       She also has anxiety.  She is not sure how much alprazolam helps.  Prozac helps some but causes stomach problems and headaches.     She sees [facility].   She is often tearful in the morning.    She fractured the L1 vertebrae September 2020 while moving a mattress and had kyphoplasty 04/03/2019.   She has also had shots in her back (nerve blocks and ESI) and PT.  She feels PT helped more than the shots..     Tremor History:      She  first noted a right hand tremor in late 2015.    She notes the tremor in the right hand much more than the left.   It worsened with intention but also present at rest   Tremor is not present when asleep.    She has not noted the tremor in the head.   Initially, she did not have any difficulty with her gait or balance.   There is no family history of tremors.   Alprazolam seemed to help the tremor.   Propranolol helped the tremor quite a bit but was poorly tolerated.   She started to have more gait issues around 2019   Sinemet seemed to help the gait but not the tremor.       REVIEW OF SYSTEMS: Constitutional: No fevers, chills, sweats, or change in appetite Eyes: No visual changes, double vision, eye pain Ear, nose and throat: No hearing loss, ear pain, nasal congestion, sore throat Cardiovascular: No chest pain, palpitations.  Has had palpitations Respiratory: No shortness of breath at rest or with exertion.   No wheezes GastrointestinaI: No nausea, vomiting, diarrhea, abdominal pain, fecal incontinence Genitourinary: No dysuria, urinary retention or frequency.  No nocturia. Musculoskeletal: No neck pain, back pain.  She notes right shoulder pain Integumentary:  No rash, pruritus, skin lesions Neurological: as above Psychiatric: Some crying spells..  Some anxiety Endocrine: No palpitations, diaphoresis, change in appetite, change in weigh or increased thirst Hematologic/Lymphatic: No anemia, purpura, petechiae. Allergic/Immunologic: No itchy/runny eyes, nasal congestion, recent allergic reactions, rashes  ALLERGIES: Allergies  Allergen Reactions  . Iodine Anaphylaxis    IV and topical forms.  [name] [Hyoscyamine Sulfate]     Vision problems/pt has glaucoma  . Salmon [Fish Allergy] Hives and Shortness Of Breath  . Shellfish Allergy Anaphylaxis  . Tramadol Swelling  . Remeron [Mirtazapine] Other (See Comments)    Cause blurred vision and red eyes, pt has glaucoma  . Aspirin Other  (See Comments)    Sever stomach pain due to ulcer scaring.  . Ciprofloxacin Diarrhea  . Codeine Nausea And Vomiting  . Darvocet [Propoxyphene N-Acetaminophen] Nausea And Vomiting  . Demerol  [Meperidine Hcl]   . Demerol [Meperidine] Nausea Only  . Dexilant [Dexlansoprazole] Swelling    Redness, swelling and peeling of both feet.  . Diphedryl [Diphenhydramine] Other (See Comments)    Increased pulse/small amount ok  . Doxycycline Hyclate Other (See Comments)    GI intolerance.  Marland Kitchen Epinephrine Other (See Comments)    Breathing problems  . Erythromycin Other (See Comments)    GI intolerance.  [name] [Cyclobenzaprine] Other (See Comments)    Tingly/prickly sensation.  Marland Kitchen Keflex [Cephalexin] Hives  . Latex Other (See Comments)    Gloves ok.  Skin gets red from elastic in underwear and latex bandaides.  . Nitrofurantoin Diarrhea  . Other   . Prednisone Other (See Comments)    Headache  . Pylera [Bis Subcit-Metronid-Tetracyc] Swelling    Tongue swelling. Face tingling  . Sulfa Antibiotics Other (See Comments)    Increased pulse, fainting, diarrhea, thrush  . Wellbutrin [Bupropion] Other (See Comments)    Headaches  . Xylocaine [Lidocaine Hcl]     With epinephrine, given by dentist.  Speeded up heart rate and she passed out (occured twice, at dentist)  . Zoloft [Sertraline Hcl] Swelling and Other (See Comments)    Migraine Swelling of tongue/lip (09/2012)  . Advil [Ibuprofen] Other (See Comments)    Motrin ok with a GI effect.  . Biaxin [Clarithromycin] Rash    Started after completing 10 day course of 2000 mg /day, Lips swelling    HOME MEDICATIONS:  Current Outpatient Medications:  .  acetaminophen (TYLENOL) 650 MG CR tablet, Take 650 mg by mouth every 8 (eight) hours as needed for pain. , Disp: , Rfl:  .  ALPRAZolam (XANAX) 1 MG tablet, Take 1 tablet (1 mg total) by mouth 2 (two) times daily as needed for anxiety., Disp: 60 tablet, Rfl: 2 .  b complex vitamins tablet, Take  1 tablet by mouth daily., Disp: , Rfl:  .  calcium carbonate (OSCAL) 1500 (600 Ca) MG TABS tablet, Take 600 mg by mouth 2 (two) times daily., Disp: , Rfl:  .  Carbidopa-Levodopa ER (SINEMET CR) 25-100 MG tablet controlled release, Take 1 tablet by mouth in the morning, at noon, in the evening, and at bedtime. , Disp: , Rfl:  .  Cholecalciferol (VITAMIN D) 2000 UNITS tablet, Take 2,000 Units by mouth daily., Disp: , Rfl:  .  dicyclomine (BENTYL) 10 MG capsule, As needed, Disp: , Rfl:  .  FLUoxetine (PROZAC) 10 MG capsule, Take 10 mg by mouth daily., Disp: , Rfl:  .  Probiotic Product (ALIGN PO), Take 1 capsule by mouth daily., Disp: , Rfl:  .  SYNTHROID 25 MCG tablet, TAKE 1 TABLET DAILY BEFORE BREAKFAST FOR HYPOTHYROIDISM, Disp: 90 tablet, Rfl: 0  Current Facility-Administered Medications:  .  0.9 %  sodium chloride infusion, 500 mL, Intravenous, Once, Rachael Fee, MD  PAST MEDICAL HISTORY: Past Medical History:  Diagnosis Date  . Bell's palsy 1966   Hx: right side facial droop, resolved per patient 04/02/19  . Carotid artery disease (HCC) 2010   on vascular screening;unchanged 2013.(could not tolerate simvastatin, no other statins tried)--<30% blockage bilat 07/2011  . Chronic abdominal pain   . Chronic fatigue and malaise   . Claustrophobia   . Depression    treated in the past for years;stopped in 2010 for a years  . Duodenal ulcer 1962   h/o  . Dysrhythmia    ocassional PVC's, no current problems per patient on 04/02/19  . Fibromyalgia   . Frequent PVCs 07/2012   Seen by [facility]: benign, asymptomatic, normal EF  . GERD (gastroesophageal reflux disease)    diet controlled  . Glaucoma, narrow-angle    s/p laser surgery  . History of hiatal hernia    during endoscopy  . Hypothyroid 9/08  . IBS (irritable bowel syndrome)    Dr. Elnoria Howard  . Ischemic colitis (HCC) 11/21/2018   no current problems per patient on 04/02/19  . Ocular migraine   . Osteoporosis 10/11   Dr.Hawkes   . Panic attack   . Parkinson disease (HCC)   . Parkinson's disease (HCC) 06/23/2016  . Recurrent UTI    has cystocele-Dr.Grewal  . Shingles 1999   h/o  . Superficial thrombophlebitis 03/2009   RLE  . Trochanteric bursitis 12/2008   bilateral    PAST SURGICAL HISTORY: Past Surgical History:  Procedure Laterality Date  . ABDOMINAL HYSTERECTOMY    . CATARACT EXTRACTION, BILATERAL  1995, 1996  . EYE SURGERY Bilateral    laser - glaucoma  . Flexible sigmoidoscopy    . KYPHOPLASTY N/A 04/03/2019   Procedure: KYPHOPLASTY L1;  Surgeon: Venita Lick, MD;  Location: [facility] OR;  Service: Orthopedics;  Laterality: N/A;  90 mins  . THYROIDECTOMY, PARTIAL  09/2005   L nodule; Dr. Gerrit Friends  . TONSILLECTOMY  1946  . UPPER GI ENDOSCOPY  06/27/12  . VAGINAL HYSTERECTOMY  1971   and bladder repair.  Still has ovaries  . WISDOM TOOTH EXTRACTION      FAMILY HISTORY: Family History  Problem Relation Age of Onset  . Heart disease Mother   . Hypertension Mother   . Hypertension Sister   . HIV Son   . Heart disease Brother   . Lung cancer Brother        lung  . Diabetes Maternal Grandfather   . Diabetes Granddaughter        type 1  . Colon cancer Neg Hx   . Esophageal cancer Neg Hx   . Prostate cancer Neg Hx   . Stomach cancer Neg Hx   . Rectal cancer Neg Hx     SOCIAL HISTORY:  Social History   Socioeconomic History  . Marital status: Married    Spouse name: Chrissie Noa  . Number of children: 2  . Years of education: Not on file  . Highest education level: Not on file  Occupational History  . Occupation: retired (school system)  Tobacco Use  . Smoking status: Never Smoker  . Smokeless tobacco: Never Used  Vaping Use  . Vaping Use: Never used  Substance and Sexual Activity  . Alcohol use: No  Alcohol/week: 0.0 standard drinks  . Drug use: No  . Sexual activity: Not Currently    Partners: Male    Birth control/protection: Other-see comments    Comment: Hysterectomy  Other  Topics Concern  . Not on file  Social History Narrative   Married.  Son lives in [place]; Daughter [name] lives in [place]; 2 grandchildren   Social Determinants of Health   Financial Resource Strain: Not on file  Food Insecurity: Not on file  Transportation Needs: Not on file  Physical Activity: Not on file  Stress: Not on file  Social Connections: Not on file  Intimate Partner Violence: Not on file     PHYSICAL EXAM  Vitals:   08/18/20 1446  BP: 135/70  Pulse: 73  SpO2: 98%  Weight: 99 lb 8 oz (45.1 kg)  Height: 4\' 11"  (1.499 m)    Body mass index is 20.1 kg/m.   General: The patient is well-developed and well-nourished and in no acute distress  Musculoskeletal: She has mild tenderness over the right subacromial bursa of the shoulder and also has mild tenderness over the L1 region of her back.    Skin:   No edema.    There were a couple small subcutaneous nodules in the right arm. .    Neurologic Exam  Mental status: The patient is alert and oriented x 3 at the time of the examination. The patient has apparent normal recent and remote memory, with an apparently normal attention span and concentration ability.   Speech is normal.  Cranial nerves: Extraocular movements are full.   Facial strength is normal.  Trapezius and sternocleidomastoid strength is normal. No dysarthria is noted.    Motor: She does not have bradykinesia.  There is a 6-7 Hz tremor on the right>> left  that is worse during rest and with intention.   Muscle bulk is normal.   Tone is normal. No cogwheeling.  Strength is  5 / 5 in all 4 extremities.   Sensory: Sensory testing is intact to touch and vibration sensation in all 4 extremities.  Coordination: Cerebellar testing reveals good finger-nose-finger bilaterally.  Gait and station: Station is normal.   The gait has a mildly reduced stride, and normal arm swing.   She is able to turn 180 degrees in 3 or 4 steps.  She has retropulsion.  Romberg is  negative.   Reflexes: Deep tendon reflexes are symmetric and normal bilaterally.    DIAGNOSTIC DATA (LABS, IMAGING, TESTING) - I reviewed patient records, labs, notes, testing and imaging myself where available.  Lab Results  Component Value Date   WBC 8.7 03/16/2020   HGB 14.3 03/16/2020   HCT 45.5 03/16/2020   MCV 93.8 03/16/2020   PLT 316 03/16/2020      Component Value Date/Time   NA 142 03/16/2020 1153   NA 141 09/03/2019 1117   K 4.2 03/16/2020 1153   CL 106 03/16/2020 1153   CO2 27 03/16/2020 1153   GLUCOSE 97 03/16/2020 1153   BUN 10 03/16/2020 1153   BUN 14 09/03/2019 1117   CREATININE 0.62 03/16/2020 1153   CREATININE 0.75 10/22/2018 1401   CREATININE 0.67 11/23/2016 0815   CALCIUM 9.6 03/16/2020 1153   PROT 6.8 09/03/2019 1117   ALBUMIN 4.2 09/03/2019 1117   AST 19 09/03/2019 1117   AST 17 10/22/2018 1401   ALT 4 09/03/2019 1117   ALT 7 10/22/2018 1401   ALKPHOS 83 09/03/2019 1117   BILITOT 1.0 09/03/2019 1117  BILITOT 0.8 10/22/2018 1401   GFRNONAA >60 03/16/2020 1153   GFRNONAA >60 10/22/2018 1401   GFRAA >60 03/16/2020 1153   GFRAA >60 10/22/2018 1401   Lab Results  Component Value Date   CHOL 233 (H) 08/29/2018   HDL 91 08/29/2018   LDLCALC 116 (H) 08/29/2018   TRIG 128 08/29/2018   CHOLHDL 2.6 08/29/2018   Lab Results  Component Value Date   HGBA1C 5.5 01/23/2018   No results found for: VITAMINB12 Lab Results  Component Value Date   TSH 2.310 01/22/2020       ASSESSMENT AND PLAN    1. Parkinson's disease (HCC)   2. Essential tremor   3. Depression with anxiety   4. Compression fracture of L1 vertebra, sequela     1.    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  continue Sinemet CR 25/100 qid.  Add low dose inderal 10 mg o tid.   Continue alprazolam (written by psychiatry) 2.    Continue to use walker for safety 3.    Ok to take CBD at night 4.    She will return to see me in 6 months or as needed if there are new or worsening  neurologic symptoms.   Kabella Cassidy A. Epimenio Foot, MD, PhD 08/18/2020, 3:33 PM Certified in Neurology, Clinical Neurophysiology, Sleep Medicine, Pain Medicine and Neuroimaging  [facility] [address], [address] [place], [place] [address] [phone]

## 2020-08-19 DIAGNOSIS — M816 Localized osteoporosis [Lequesne]: Secondary | ICD-10-CM | POA: Diagnosis not present

## 2020-08-19 DIAGNOSIS — N958 Other specified menopausal and perimenopausal disorders: Secondary | ICD-10-CM | POA: Diagnosis not present

## 2020-08-19 LAB — HM DEXA SCAN

## 2020-08-24 DIAGNOSIS — M545 Low back pain, unspecified: Secondary | ICD-10-CM | POA: Diagnosis not present

## 2020-08-24 NOTE — Progress Notes (Signed)
[facility]UNITY PALLIATIVE CARE RN NOTE  PATIENT NAME: Tracy Malone DOB: [birth year] MRN: [id number]  PRIMARY CARE PROVIDER: [name], MD  RESPONSIBLE PARTY: [name] (husband) Acct ID - Guarantor Home Phone Work Phone Relationship Acct Type  [id number] Tracy Malone, Tracy [phone] Self P/F     [address] [place], [place] [address]   Covid-19 Pre-screening Negative  PLAN OF CARE and INTERVENTION:  1. ADVANCE CARE PLANNING/GOALS OF CARE: Goal is for patient to remain at home with her husband. She is a Full code. 2. PATIENT/CAREGIVER EDUCATION: Symptom management, safe mobility, pain management, s/s of infection 3. DISEASE STATUS: Met with patient and her husband, Tracy Malone, in their home. Patient remains alert and oriented x 4. She is pleasant and engaging. Her mood has improved with less crying spells since being on Prozac. She is also very pleased with her new therapy counselor at [facility]. She complains of ongoing pain in her left hip which radiates down her leg to her knee. She says it is difficult for her getting in and out of a vehicle. She had a recent MRI done. She has an appointment with Dr. Nelva Bush (Ortho) tomorrow to discuss results. She says that she doesn't want to receive any more injections. She takes Tylenol and applies a heating pad to help. She remains ambulatory and able to perform ADLs independently, but says that showering and washing her hair is a task. She has to rest and lie down afterwards. She also takes naps during the day. She sometimes uses her walker when waking up in the middle of the night to go to the bathroom. She walks leaning towards the right side. She is very careful and has not had any recent falls. She says her intake has been ok. She has occasional bloating on the left side of her abdomen. She denies any recent issues of constipation. Will continue to monitor.   HISTORY OF PRESENT ILLNESS:  This is a 87 yo female with a diagnosis of  Parkinson'sdisease.She has a h/o GERD, IBS, ischemic colitis, hypothyroidism, essential tremors and osteoporosis.Palliative care team continues to follow patient and will visit monthly and PRN.   CODE STATUS: Full code ADVANCED DIRECTIVES: Y MOST FORM:  no PPS: 50%   PHYSICAL EXAM:   LUNGS: clear to auscultation  CARDIAC: Cor RRR EXTREMITIES: No edema SKIN: Exposed skin is dry and intact  NEURO: Alert and oriented x 4, pleasant mood, generalized weakness, right hand tremor, ambulatory (sometimes uses walker)   (Duration of visit and documentation 45 minutes)   [name], RN BSN

## 2020-08-26 DIAGNOSIS — M810 Age-related osteoporosis without current pathological fracture: Secondary | ICD-10-CM | POA: Diagnosis not present

## 2020-08-27 ENCOUNTER — Ambulatory Visit (INDEPENDENT_AMBULATORY_CARE_PROVIDER_SITE_OTHER): Payer: Medicare Other | Admitting: Adult Health

## 2020-08-27 ENCOUNTER — Other Ambulatory Visit: Payer: Self-pay

## 2020-08-27 ENCOUNTER — Encounter: Payer: Self-pay | Admitting: Adult Health

## 2020-08-27 DIAGNOSIS — F411 Generalized anxiety disorder: Secondary | ICD-10-CM | POA: Diagnosis not present

## 2020-08-27 DIAGNOSIS — F331 Major depressive disorder, recurrent, moderate: Secondary | ICD-10-CM | POA: Diagnosis not present

## 2020-08-27 DIAGNOSIS — G4700 Insomnia, unspecified: Secondary | ICD-10-CM | POA: Diagnosis not present

## 2020-08-27 NOTE — Progress Notes (Signed)
Tracy Malone [id number] 01/29/1932 87 y.o.  Subjective:   Patient ID:  Tracy Malone is a 87 y.o. (DOB 1932-01-05) female.  Chief Complaint: No chief complaint on file.   HPI Tracy Malone presents to the office today for follow-up of MDD, GAD, and insomnia.  Describes mood today as "ok". Pleasant. Tearful at times. Mood symptoms - reports decreased depression and anxiety. Feels irritable at times - "I get cranky". Feels like Prozac 10mg  has been helpful for mood but is having "stomach pains". Concerned Prozac may be the reason she is having "stomach" pain. Diagnosed with Parkinson's in 2017. Started on Carba-Dopa Levodopa on 10/21. Was feeling worse on the medication. Back surgery a year ago. Varying interest and motivation. Taking medications as prescribed.  Energy levels lower. Using a rolling walker. Active, does not have a regular exercise routine. Enjoys some usual interests and activities. Married. Lives with husband of 79 years. 2 children 59 and 55. Son lives in [place]. Spending time with family. Appetite adequate - 4 to 5 "mini" meals. Weight stable - 99.5 pounds.  Sleeping difficulties. Averages 3 to 4 hours - back pain. Focus and concentration stable. Completing tasks. Managing aspects of household. Retired.  Denies SI or HI.  Denies AH or VH.  Previous medication trials: Multiple medication trials  PHQ2-9   [place] Office Visit from 09/03/2019 in [place] [facility] Telephone from 12/02/2018 in [place] Visit from 08/29/2018 in [place] Visit from 08/27/2017 in [place] Visit from 09/26/2016 in [facility] for [facility]ious Disease  PHQ-2 Total Score 6 1 4 4  0  PHQ-9 Total Score 17 -- -- -- --       Review of Systems:  Review of Systems  Musculoskeletal: Negative for gait problem.  Neurological: Negative for tremors.  Psychiatric/Behavioral:       Please refer to  HPI    Medications: I have reviewed the patient's current medications.  Current Outpatient Medications  Medication Sig Dispense Refill  . acetaminophen (TYLENOL) 650 MG CR tablet Take 650 mg by mouth every 8 (eight) hours as needed for pain.     Marland Kitchen ALPRAZolam (XANAX) 1 MG tablet Take 1 tablet (1 mg total) by mouth 2 (two) times daily as needed for anxiety. 60 tablet 2  . b complex vitamins tablet Take 1 tablet by mouth daily.    . calcium carbonate (OSCAL) 1500 (600 Ca) MG TABS tablet Take 600 mg by mouth 2 (two) times daily.    . Carbidopa-Levodopa ER (SINEMET CR) 25-100 MG tablet controlled release Take 1 tablet by mouth in the morning, at noon, in the evening, and at bedtime. 120 tablet 11  . Cholecalciferol (VITAMIN D) 2000 UNITS tablet Take 2,000 Units by mouth daily.    Marland Kitchen dicyclomine (BENTYL) 10 MG capsule As needed    . Probiotic Product (ALIGN PO) Take 1 capsule by mouth daily.    . propranolol (INDERAL) 10 MG tablet Take 1 tablet (10 mg total) by mouth 3 (three) times daily. 90 tablet 5  . SYNTHROID 25 MCG tablet TAKE 1 TABLET DAILY BEFORE BREAKFAST FOR HYPOTHYROIDISM 90 tablet 0   Current Facility-Administered Medications  Medication Dose Route Frequency Provider Last Rate Last Admin  . 0.9 %  sodium chloride infusion  500 mL Intravenous Once Milus Banister, MD        Medication Side Effects: None  Allergies:  Allergies  Allergen Reactions  . Iodine  Anaphylaxis    IV and topical forms. Other reaction(s): Unknown  . Levsin [Hyoscyamine Sulfate]     Vision problems/pt has glaucoma  . Salmon [Fish Allergy] Hives and Shortness Of Breath  . Shellfish Allergy Anaphylaxis  . Tramadol Swelling  . Remeron [Mirtazapine] Other (See Comments)    Cause blurred vision and red eyes, pt has glaucoma  . Aspirin Other (See Comments)    Sever stomach pain due to ulcer scaring.  . Bis Subcit-Metronid-Tetracyc Swelling    Tongue swelling. Face tingling Other reaction(s): Unknown  .  Cephalexin Hives    Other reaction(s): hives  . Ciprofloxacin Diarrhea  . Codeine Nausea And Vomiting  . Contrast Media [Iodinated Diagnostic Agents]   . Cyclobenzaprine Other (See Comments)    Tingly/prickly sensation. Other reaction(s): tingly/prickly sensation  . Darvocet [Propoxyphene N-Acetaminophen] Nausea And Vomiting  . Demerol [Meperidine] Nausea Only  . Dexlansoprazole Swelling    Redness, swelling and peeling of both feet. Other reaction(s): foot pain  . Diphedryl [Diphenhydramine] Other (See Comments)    Increased pulse/small amount ok  . Doxycycline Hyclate Other (See Comments)    GI intolerance.  . Doxycycline Hyclate     Other reaction(s): GI intolerance  . Epinephrine Other (See Comments)    Breathing problems Other reaction(s): breathing problems/fainting  . Erythromycin Other (See Comments)    GI intolerance. Other reaction(s): GI  . Fish Oil     Other reaction(s): breathing problems/hives  . Hyoscyamine     Other reaction(s): eye pain  . Latex Other (See Comments)    Gloves ok.  Skin gets red from elastic in underwear and latex bandaides.  . Meperidine Hcl     Other reaction(s): vomiting  . Nitrofurantoin Diarrhea  . Other     Other reaction(s): migraines Other reaction(s): Unknown Other reaction(s): Unknown Other reaction(s): Unknown Other reaction(s): increased pulse, faint, diarrhea  . Prednisone Other (See Comments)    Headache Other reaction(s): headache  . Ra Diphedryl Allergy [Diphenhydramine Hcl]     Other reaction(s): increased pulse small dose okay  . Sertraline Hcl Swelling and Other (See Comments)    Migraine Swelling of tongue/lip (09/2012) Other reaction(s): Unknown  . Shellfish-Derived Products     Other reaction(s): Unknown  . Sulfa Antibiotics Other (See Comments)    Increased pulse, fainting, diarrhea, thrush  . Wellbutrin [Bupropion] Other (See Comments)    Headaches  . Xylocaine [Lidocaine Hcl]     With epinephrine, given  by dentist.  Speeded up heart rate and she passed out (occured twice, at dentist)  . Xylocaine [Lidocaine]     Other reaction(s): Unknown  . Biaxin [Clarithromycin] Rash    Started after completing 10 day course of 2000 mg /day, Lips swelling  . Ibuprofen Other (See Comments)    Motrin ok with a GI effect. Other reaction(s): rash Motrin okay with a GI effect    Past Medical History:  Diagnosis Date  . Bell's palsy 1966   Hx: right side facial droop, resolved per patient 04/02/19  . Carotid artery disease ([place]) 2010   on vascular screening;unchanged 2013.(could not tolerate simvastatin, no other statins tried)--<30% blockage bilat 07/2011  . Chronic abdominal pain   . Chronic fatigue and malaise   . Claustrophobia   . Depression    treated in the past for years;stopped in 2010 for a years  . Duodenal ulcer 1962   h/o  . Dysrhythmia    ocassional PVC's, no current problems per patient on 04/02/19  . Fibromyalgia   .  Frequent PVCs 07/2012   Seen by [facility]: benign, asymptomatic, normal EF  . GERD (gastroesophageal reflux disease)    diet controlled  . Glaucoma, narrow-angle    s/p laser surgery  . History of hiatal hernia    during endoscopy  . Hypothyroid 9/08  . IBS (irritable bowel syndrome)    Dr. Benson [place]  . Ischemic colitis ([place]) 11/21/2018   no current problems per patient on 04/02/19  . Ocular migraine   . Osteoporosis 10/11   Dr.Hawkes  . Panic attack   . Parkinson disease ([place])   . Parkinson's disease ([place]) 06/23/2016  . Recurrent UTI    has cystocele-Dr.Grewal  . Shingles 1999   h/o  . Superficial thrombophlebitis 03/2009   RLE  . Trochanteric bursitis 12/2008   bilateral    Family History  Problem Relation Age of Onset  . Heart disease Mother   . Hypertension Mother   . Hypertension Sister   . HIV Son   . Heart disease Brother   . Lung cancer Brother        lung  . Diabetes Maternal Grandfather   . Diabetes Granddaughter        type 1  . Colon  cancer Neg Hx   . Esophageal cancer Neg Hx   . Prostate cancer Neg Hx   . Stomach cancer Neg Hx   . Rectal cancer Neg Hx     Social History   Socioeconomic History  . Marital status: Married    Spouse name: Gwyndolyn Saxon  . Number of children: 2  . Years of education: Not on file  . Highest education level: Not on file  Occupational History  . Occupation: retired (school system)  Tobacco Use  . Smoking status: Never Smoker  . Smokeless tobacco: Never Used  Vaping Use  . Vaping Use: Never used  Substance and Sexual Activity  . Alcohol use: No    Alcohol/week: 0.0 standard drinks  . Drug use: No  . Sexual activity: Not Currently    Partners: Male    Birth control/protection: Other-see comments    Comment: Hysterectomy  Other Topics Concern  . Not on file  Social History Narrative   Married.  Son lives in [place]; Daughter [name] lives in [place]; 2 grandchildren   Social Determinants of Health   Financial Resource Strain: Not on file  Food Insecurity: Not on file  Transportation Needs: Not on file  Physical Activity: Not on file  Stress: Not on file  Social Connections: Not on file  Intimate Partner Violence: Not on file    Past Medical History, Surgical history, Social history, and Family history were reviewed and updated as appropriate.   Please see review of systems for further details on the patient's review from today.   Objective:   Physical Exam:  LMP  (LMP Unknown)   Physical Exam Constitutional:      General: She is not in acute distress. Musculoskeletal:        General: No deformity.  Neurological:     Mental Status: She is alert and oriented to person, place, and time.     Coordination: Coordination normal.  Psychiatric:        Attention and Perception: Attention and perception normal. She does not perceive auditory or visual hallucinations.        Mood and Affect: Mood normal. Mood is not anxious or depressed. Affect is not labile, blunt, angry or  inappropriate.        Speech: Speech  normal.        Behavior: Behavior normal.        Thought Content: Thought content normal. Thought content is not paranoid or delusional. Thought content does not include homicidal or suicidal ideation. Thought content does not include homicidal or suicidal plan.        Cognition and Memory: Cognition and memory normal.        Judgment: Judgment normal.     Comments: Insight intact     Lab Review:     Component Value Date/Time   NA 142 03/16/2020 1153   NA 141 09/03/2019 1117   K 4.2 03/16/2020 1153   CL 106 03/16/2020 1153   CO2 27 03/16/2020 1153   GLUCOSE 97 03/16/2020 1153   BUN 10 03/16/2020 1153   BUN 14 09/03/2019 1117   CREATININE 0.62 03/16/2020 1153   CREATININE 0.75 10/22/2018 1401   CREATININE 0.67 11/23/2016 0815   CALCIUM 9.6 03/16/2020 1153   PROT 6.8 09/03/2019 1117   ALBUMIN 4.2 09/03/2019 1117   AST 19 09/03/2019 1117   AST 17 10/22/2018 1401   ALT 4 09/03/2019 1117   ALT 7 10/22/2018 1401   ALKPHOS 83 09/03/2019 1117   BILITOT 1.0 09/03/2019 1117   BILITOT 0.8 10/22/2018 1401   GFRNONAA >60 03/16/2020 1153   GFRNONAA >60 10/22/2018 1401   GFRAA >60 03/16/2020 1153   GFRAA >60 10/22/2018 1401       Component Value Date/Time   WBC 8.7 03/16/2020 1153   RBC 4.85 03/16/2020 1153   HGB 14.3 03/16/2020 1153   HGB 14.2 01/22/2020 1213   HCT 45.5 03/16/2020 1153   HCT 42.5 01/22/2020 1213   PLT 316 03/16/2020 1153   PLT 268 01/22/2020 1213   MCV 93.8 03/16/2020 1153   MCV 91 01/22/2020 1213   MCH 29.5 03/16/2020 1153   MCHC 31.4 03/16/2020 1153   RDW 13.1 03/16/2020 1153   RDW 12.5 01/22/2020 1213   LYMPHSABS 1.8 01/22/2020 1213   MONOABS 0.9 04/08/2019 0516   EOSABS 0.2 01/22/2020 1213   BASOSABS 0.1 01/22/2020 1213    No results found for: POCLITH, LITHIUM   No results found for: PHENYTOIN, PHENOBARB, VALPROATE, CBMZ   .res Assessment: Plan:    Plan:  PDMP reviewed  1. Xanax 1mg  BID - takes 1/2  tablet at bedtime 2. D/C Prozac 10mg  daily for now - rule out stomach pain.  Read and reviewed note with patient for accuracy.   RTC 4 weeks  Patient advised to contact office with any questions, adverse effects, or acute worsening in signs and symptoms.  Discussed potential benefits, risk, and side effects of benzodiazepines to include potential risk of tolerance and dependence, as well as possible drowsiness.  Advised patient not to drive if experiencing drowsiness and to take lowest possible effective dose to minimize risk of dependence and tolerance.    Diagnoses and all orders for this visit:  Generalized anxiety disorder  Major depressive disorder, recurrent episode, moderate (HCC)  Insomnia, unspecified type     Please see After Visit Summary for patient specific instructions.  Future Appointments  Date Time Provider [place]  08/31/2020  3:00 PM Shanon Ace, LCSW CP-CP None  09/08/2020  9:45 AM [name], MD PFM-PFM [facility]  09/15/2020  3:00 PM Shanon Ace, LCSW CP-CP None  09/28/2020  3:00 PM Shanon Ace, LCSW CP-CP None  10/12/2020  3:00 PM Shanon Ace, LCSW CP-CP None  02/16/2021  3:00 PM Lomax, Amy, NP [facility] None  No orders of the defined types were placed in this encounter.   -------------------------------

## 2020-08-31 ENCOUNTER — Ambulatory Visit (INDEPENDENT_AMBULATORY_CARE_PROVIDER_SITE_OTHER): Payer: Medicare Other | Admitting: Psychiatry

## 2020-08-31 ENCOUNTER — Other Ambulatory Visit: Payer: Self-pay

## 2020-08-31 DIAGNOSIS — F411 Generalized anxiety disorder: Secondary | ICD-10-CM

## 2020-08-31 NOTE — Progress Notes (Signed)
[facility] Counselor/Therapist Progress Note  Patient ID: Tracy Malone, MRN: [id number],    Date: 08/31/2020  Time Spent: 60 minutes    3:00pm to 4:00pm  Treatment Type: Individual Therapy  Reported Symptoms: anxiety, depression, physical pain in back (ongoing 6 mos and is being treated at [facility]  Mental Status Exam:  Appearance:   Neat     Behavior:  Appropriate, Sharing and Motivated  Motor:  walks with a [place]  Speech/Language:   Clear and Coherent  Affect:  anxious, depressed  Mood:  anxious and depressed  Thought process:  some tangentiality  Thought content:    some ruminating  Sensory/Perceptual disturbances:    WNL  Orientation:  oriented to person, place, time/date, situation, day of week, month of year and year  Attention:  Good  Concentration:  Good and Fair  Memory:  WNL per patient  Fund of knowledge:   Good  Insight:    Good and Fair  Judgment:   Good  Impulse Control:  Good   Risk Assessment: Danger to Self:  No Self-injurious Behavior: No Danger to Others: No Duty to Warn:no Physical Aggression / Violence:No  Access to Firearms a concern: No  Gang Involvement:No   Subjective: Patient today reporting anxiety and depression, mostly in reference to personal and family situations.  Interventions: Solution-Oriented/Positive Psychology and Ego-Supportive  Diagnosis:   ICD-10-CM   1. Generalized anxiety disorder  F41.1     Plan of Care: Patient not signing treatment plan on computer screen due to COVID.  Treatment Goals: Treatment goals will remain on treatment plan as patient works with strategies to achieve her goals. Progress will be noted each session and documented in the "progress" section of note.  Long term goal: Develop the ability to recognize, accept, and cope with feelings of depression.  Short term goal: Verbalize any unresolved grief issues that may be contributing to depression.  Strategy: Replace negative  self-defeating self talk with verbalization of realistic and positive cognitive messages to lessen depression and improve mood.  Progress: Patient in today reporting anxiety, depression, and some tearfulness mostly related to personal/family issues.  Her back problems she reports still persist and went for PT consult and to return twice this week. Upset regarding son as the place where he is incarcerated in [place] has been on "lock-down" recently due to a shooting in another facility in [place] and all the prisons were locked-down temporarily.  Shared her concerns for son and his safety there, and hoping he will get an earlier release as he is hoping also. That has been discussed but family not notified yet. Struggles with her son being away, and worked more today on her grief, and also her acceptance and patience with the situation and with herself. Shares updated concerns re: her husband and his health problems and "doesn't talk to others much". Does show decrease in her self-defeating in her self-talk but having to work at it. Looking forward to her husband's 92nd birthday next month.  Patient today was not as tearful as in earlier sessions and is able to articulate quite well her concerns however does sometimes get caught up mostly with the son's situation and hard to move past it, but is doing better than when she originally came for help. Continued work on that issue as well as her self-care with her various physical problems, she is to continue her PT, and encouraged to move about as she is able occasionally throughout the day, work  on interrupting her anxious/fearful thoughts and replacing them with more positive and empowering thoughts, staying in the present versus the past or too far into the future, focusing on what she can change or control and letting go of what she cannot, reframing on issues regarding her adult son, and staying in contact with others who are supportive of her and her  husband.  Need areas that patient is working on and and outside of sessions listed below: Accepting things that she cannot change, including situation for her adult son. Replacing anxious and fearful thoughts to be more reality-based and empowering thoughts. Reframing hurtful messages or situations that bother her. Believe that she can make some changes rather than saying "I cannot". Intentionally focusing more on positives daily. Working to better manage her frustration, anxiety, and depression. Being determined and persistent in doing things differently that will help her. Staying in the present and focus on what she can control or change.   Goal review and progress/challenges noted with patient.  Next appointment within 3 weeks.   Shanon Ace, LCSW

## 2020-09-01 DIAGNOSIS — M545 Low back pain, unspecified: Secondary | ICD-10-CM | POA: Diagnosis not present

## 2020-09-03 DIAGNOSIS — M545 Low back pain, unspecified: Secondary | ICD-10-CM | POA: Diagnosis not present

## 2020-09-07 ENCOUNTER — Telehealth: Payer: Self-pay | Admitting: *Deleted

## 2020-09-07 DIAGNOSIS — M545 Low back pain, unspecified: Secondary | ICD-10-CM | POA: Diagnosis not present

## 2020-09-07 NOTE — Progress Notes (Signed)
Chief Complaint  Patient presents with   Medicare Wellness    Nonfasting (had toast and small banana) AWV. Having some abdominal pain, she thinks it may be radiating around from her back. Constant fatigue. Depression not getting better. Saw Dr. Helane Rima this month for DEXA consult, she brought copy today, no gyn exam, no mammo done. Has another appt in March-she may have gyn exam done then. Saw Dr. Buddy Duty in January. Does not have Medicare part D.    Depression Screening    (6)    Tracy Malone is a 87 y.o. female who presents for annual wellness visit and follow-up on chronic medical conditions.  She is unaccompanied today (husband in waiting room). She has the following concerns: She is having some left sided abdominal pain today.  She has chronic constipation and frequent bloating, and has been under the care of GI for these complaints. She also has h/o ischemic colitis (10/2018). She last saw GI on 07/2020.  It was recommended that she take Gas-X BID, digestive enzyme BID, and Miralax 1 capful in water at bedtime if no BM x 24 hr. Her bowels have been moving regularly. She admits that she hasn't been taking the gas-X. She had bad cramps the other day which required her to take Bentyl, which did help. Uses this very rarely.  She is followed by palliative care, with home visits. They are coming tomorrow, she enjoys this.  Hypothyroidism:She reports compliance with taking her Synthroid 47mcg daily, on an empty stomach, separate from her other medications.  She denies any significant changes to hair/skin/energy. She has chronic dryness to her skin, mild hair loss, fatigue and depression.  She feels like the hair is falling out much more easily now compared to previously. No bowel changes. She reports that Dr. Buddy Duty also checked her thyroid, in January, and it was fine.  Lab Results  Component Value Date   TSH 2.310 01/22/2020   Osteoporosis, with L1 compression fracture, s/p kyphoplasty. She  had known osteoporosis prior to this fracture (DEXA done in 2016 by Dr. Trudie Reed). She stopped Prolia due to joint pains.  She has seen Dr. Buddy Duty in follow-up to discuss her osteoporosis, but reports this visit was prior to getting her DEXA repeated.  She saw Dr. Helane Rima earlier this month. She is considering having allergy testing for salmon to see if calcitonin is a possibility for treatment. DEXA was done by Dr. Helane Rima 08/19/20 T-4.1 at R femoral neck, -3.7 at L femoral neck, -3.2 spine. Vitamin D level was normal at 50 in 08/2019.  Chronic back pain--she had no benefit from injections (selective nerve root blocks) through [facility].  Last notes in chart from 06/2020, got trigger point injections and MRI ordered. She is now getting physical therapy (told them she doesn't want any more shots).  She was told the "MRI was fine".  Depression:  Under the care of psychiatrist and therapist.  Parkinson's and Essential Tremor, under the care of [name]. She last saw him in 07/2020. She continues on Sinimet; inderal 10mg  TID was added at this visit. She reports she never started it, was too worried about it lowering her blood pressure.  Atherosclerotic calcification of the aortic knob was noted on CXR in 02/2020. She was also noted to have 20-30% stenosis of R ICA on [place] in 2013, mild plaque noted. She was intolerant of statins due to myalgias  Immunization History  Administered Date(s) Administered   Fluad Quad(high Dose 65+) 05/15/2019   Influenza  Split 05/16/2011, 06/22/2015, 05/12/2016   Influenza, High Dose Seasonal PF 06/13/2013, 04/28/2014, 05/12/2017, 04/17/2018, 05/13/2020   Influenza-Unspecified 05/17/2016   PFIZER(Purple Top)SARS-COV-2 Vaccination 08/28/2019, 09/18/2019, 07/12/2020   Pneumococcal Conjugate-13 09/29/2013   Pneumococcal Polysaccharide-23 08/07/1999, 08/29/2018   Td 08/06/1997, 09/23/2009   Tdap 09/23/2009   Last Pap smear: 03/2015 with Dr. Helane Rima; She has GYN  scheduled for 09/2020 Last mammogram:06/2019; delayed due to rib fractures. Last colonoscopy: 2006; negative Cologard 04/2015 and 05/2018 Last DEXA: 07/2020 through Dr. Georgia Duff at R femoral neck, -3.7 at L femoral neck, -3.2 spine Carotid u/s 07/2011 (no significant stenosis, stable/unchanged, 20-30% at R ICA) Dentist: twice yearly Ophtho: yearly Exercise: Physical therapy 2x/week. She would like to do more, but was told not to. Vitamin D-OH was 50 in 08/2019 Lipids: Lab Results  Component Value Date   CHOL 233 (H) 08/29/2018   HDL 91 08/29/2018   LDLCALC 116 (H) 08/29/2018   TRIG 128 08/29/2018   CHOLHDL 2.6 08/29/2018   Other doctors caring for this patient include:  GYN: Dr. Helane Rima Rheum: Dr.Syed (previously Dr. Trudie Reed), no longer sees Ophtho: Dr. Tora Kindred Dentist: Dr. Sandria Bales GI: Dr.Jacobs Cardiology: Dr. Johnsie Cancel Derm: Dr. Derrel Nip Urologist: Dr. Silverio Lay plan to see him again) Psychiatrist:Regina Dwaine Gale, NP Psychologist:  Shanon Ace Podiatry: Dr. Elisha Ponder Ortho: Dr. Eugenie Filler (or ankle), Dr. Rolena Infante (back) Pain: Dr. Nelva Bush Neuro: [name] (also previously saw Dr.Tat, no longer sees her) Veins: Dr. Renaldo Reel Endocrine: Dr. Buddy Duty  End of Life: She has a healthcare power of attorney and living will.  Depression screening:PHQ-9 score of 2, PHQ-9 score of 7; she is under care of psychiatrist and therapist. Fall screen:1, fractured 2 ribs Functional Status survey:  no longer drives, doesn't like to make decisions; denies memory concerns. Some trouble walking due to back pain. See full screen in epic Mini-Cog screen: normal (perfect circle, no evidence of tremor) See epic for full screens.   PMH, PSH, SH and FH reviewed  Outpatient Encounter Medications as of 09/08/2020  Medication Sig   ALPRAZolam (XANAX) 1 MG tablet Take 1 tablet (1 mg total) by mouth 2 (two) times daily as needed for anxiety.   b complex vitamins tablet Take 1  tablet by mouth daily.   calcium carbonate (OSCAL) 1500 (600 Ca) MG TABS tablet Take 600 mg by mouth 2 (two) times daily.   Carbidopa-Levodopa ER (SINEMET CR) 25-100 MG tablet controlled release Take 1 tablet by mouth in the morning, at noon, in the evening, and at bedtime.   Cholecalciferol (VITAMIN D) 2000 UNITS tablet Take 2,000 Units by mouth daily.   SYNTHROID 25 MCG tablet TAKE 1 TABLET DAILY BEFORE BREAKFAST FOR HYPOTHYROIDISM   acetaminophen (TYLENOL) 650 MG CR tablet Take 650 mg by mouth every 8 (eight) hours as needed for pain.  (Patient not taking: Reported on 09/08/2020)   dicyclomine (BENTYL) 10 MG capsule As needed (Patient not taking: Reported on 09/08/2020)   FLUoxetine (PROZAC) 10 MG capsule Take 10 mg by mouth daily. (Patient not taking: Reported on 09/08/2020)   Probiotic Product (ALIGN PO) Take 1 capsule by mouth daily. (Patient not taking: Reported on 09/08/2020)   [DISCONTINUED] propranolol (INDERAL) 10 MG tablet Take 1 tablet (10 mg total) by mouth 3 (three) times daily.   Facility-Administered Encounter Medications as of 09/08/2020  Medication   0.9 %  sodium chloride infusion   Allergies  Allergen Reactions   Iodine Anaphylaxis    IV and topical forms. Other reaction(s): Unknown   Levsin [Hyoscyamine Sulfate]  Vision problems/pt has glaucoma   Salmon [Fish Allergy] Hives and Shortness Of Breath   Shellfish Allergy Anaphylaxis   Tramadol Swelling   Remeron [Mirtazapine] Other (See Comments)    Cause blurred vision and red eyes, pt has glaucoma   Aspirin Other (See Comments)    Sever stomach pain due to ulcer scaring.   Bis Subcit-Metronid-Tetracyc Swelling    Tongue swelling. Face tingling Other reaction(s): Unknown   Cephalexin Hives    Other reaction(s): hives   Ciprofloxacin Diarrhea   Codeine Nausea And Vomiting   Contrast Media [Iodinated Diagnostic Agents]    Cyclobenzaprine Other (See Comments)    Tingly/prickly  sensation. Other reaction(s): tingly/prickly sensation   Darvocet [Propoxyphene N-Acetaminophen] Nausea And Vomiting   Demerol [Meperidine] Nausea Only   Dexlansoprazole Swelling    Redness, swelling and peeling of both feet. Other reaction(s): foot pain   Diphedryl [Diphenhydramine] Other (See Comments)    Increased pulse/small amount ok   Doxycycline Hyclate Other (See Comments)    GI intolerance.   Doxycycline Hyclate     Other reaction(s): GI intolerance   Epinephrine Other (See Comments)    Breathing problems Other reaction(s): breathing problems/fainting   Erythromycin Other (See Comments)    GI intolerance. Other reaction(s): GI   Fish Oil     Other reaction(s): breathing problems/hives   Hyoscyamine     Other reaction(s): eye pain   Latex Other (See Comments)    Gloves ok.  Skin gets red from elastic in underwear and latex bandaides.   Meperidine Hcl     Other reaction(s): vomiting   Nitrofurantoin Diarrhea   Other     Other reaction(s): migraines Other reaction(s): Unknown Other reaction(s): Unknown Other reaction(s): Unknown Other reaction(s): increased pulse, faint, diarrhea   Prednisone Other (See Comments)    Headache Other reaction(s): headache   Ra Diphedryl Allergy [Diphenhydramine Hcl]     Other reaction(s): increased pulse small dose okay   Sertraline Hcl Swelling and Other (See Comments)    Migraine Swelling of tongue/lip (09/2012) Other reaction(s): Unknown   Shellfish-Derived Products     Other reaction(s): Unknown   Sulfa Antibiotics Other (See Comments)    Increased pulse, fainting, diarrhea, thrush   Wellbutrin [Bupropion] Other (See Comments)    Headaches   Xylocaine [Lidocaine Hcl]     With epinephrine, given by dentist.  Speeded up heart rate and she passed out (occured twice, at dentist)   Xylocaine [Lidocaine]     Other reaction(s): Unknown   Biaxin [Clarithromycin] Rash    Started after completing 10 day course  of 2000 mg /day, Lips swelling   Ibuprofen Other (See Comments)    Motrin ok with a GI effect. Other reaction(s): rash Motrin okay with a GI effect    ROS: The patient denies fever, headaches, decreased hearing, breast concerns, chest pain (some L-sided chest wall pain, no exertional pain), dizziness, syncope, cough, swelling, nausea, vomiting, diarrhea, constipation, abdominal pain, melena, hematochezia, hematuria, incontinence, dysuria, vaginal bleeding, discharge, odor or itch, genital lesions, numbness, tingling, weakness, suspicious skin lesions, abnormal bleeding, or enlarged lymph nodes.Some easy bruising. Chronic low back pain Depression, per HPI Constipation and bloating-- Constipation has resolved. Having some bloating and LLQ today Tremor R>L +insomnia--improved except related to back pain. Some knee pain, chronic and mild, comes and goes. Chronic issues with L eye draining (has seen ophtho multiple times). No changes to vision. Heaviness in chest off/on since her back surgery, never with exertion. Currently denies any urinary complaints (had  frequency after Prolia shots). Today reports some dizziness with looking down, hard to explain if equilibrium, LH or both.   PHYSICAL EXAM:  BP 118/60    Pulse 64    Ht 4\' 11"  (1.499 m)    Wt 99 lb 6.4 oz (45.1 kg)    LMP  (LMP Unknown)    BMI 20.08 kg/m   Wt Readings from Last 3 Encounters:  09/08/20 99 lb 6.4 oz (45.1 kg)  08/18/20 99 lb 8 oz (45.1 kg)  07/29/20 100 lb (45.4 kg)   Wt 100# 3.2 oz at her AWV 08/2019  General Appearance:  Alert, cooperative,thin elderly female in no distress.  Head:  Normocephalic, without obvious abnormality, atraumatic  Eyes:  PERRL, conjunctiva/corneas clear, EOM's intact  Ears:  Normal TM's and external ear canals  Nose: Not examined, wearing mask due to COVID-19 pandemic  Throat: Not examined, wearing mask due to COVID-19 pandemic  Neck: Supple, no lymphadenopathy; thyroid:  no enlargement/ tenderness/nodules; no carotid bruit or JVD. She is somewhat tender at her L jaw today.   Back:  Spine nontender, no curvature, no CVA tenderness. No trigger points in upper back. Tender at left paraspinous muscles in lower back. Nontender at SI joints.  Lungs:  Clear to auscultation bilaterally without wheezes, rales or ronchi; respirations unlabored  Chest Wall:   Nontender, no deformity  Heart:  Regular rate and rhythm, S1 and S2 normal, no murmur, rub or gallop  Breast Exam:  Deferred to GYN  Abdomen:  Soft, nondistended, normoactive bowel sounds, no masses, no hepatosplenomegaly. Very mild LLQ tenderness, no rebound, mass, somewhat diffuse/mild discomfort.  Genitalia:  Deferred to GYN     Extremities: No clubbing, cyanosis or edema  Pulses: 2+ and symmetric all extremities  Skin: Skin color, texture, turgor normal. No rashes/lesions.   Lymph nodes: Cervical, supraclavicular, and axillary nodes normal  Neurologic: Slow, but normal gait; reflexes 2+ and symmetric throughout. Resting right hand tremor noted (pill-rolling).  Normal handwriting (and no evidence of tremor in drawing spiral, circle for her clock)  Psych:Depressed mood, normal affect,normalhygiene and grooming.Normal eye contact and speech   ASSESSMENT/PLAN:  Medicare annual wellness visit, subsequent  Parkinson disease ([place]) - cont sinimet per neuro  Osteoporosis of lumbar spine - to f/u with Dr. Buddy Duty now that DEXA was repeated, showing significant osteoporosis in multiple areas.  Treatment rec.  Discussed Ca, D, wt-bearing exercise  Hypothyroidism, unspecified type - check TSH due to pt's report of worsening hair loss.  Suspect normal; cont Synthroid  Chronic constipation  Chronic bilateral low back pain without sciatica - cont PT and f/u with ortho prn  Aortic atherosclerosis ([place]) - noted on CXR 02/2020, and some carotid  atherosclerosis; intolerant of statins  Myalgia due to statin  Essential tremor - discussed potential SE, dosing of inderal, as recommended by [name]. Recommended she try  Depression, recurrent ([place]) - under care of psych; mild depression now, overall improved   Should need Synthroid RF--she stated she had 2 bottles at home still from [facility].   Discussed monthly self breast exams and yearly mammograms; at least 30 minutes of aerobic activity at least 5 days/week, weight-bearing exercise at least 2-3x/week; proper sunscreen use reviewed; healthy diet, including goals of calcium and vitamin D intake and alcohol recommendations (less than or equal to 1 drink/day) reviewed; regular seatbelt use; changing batteries in smoke detectors. Immunization recommendations discussed--due for TdaP from pharmacy.  Shingrix also recommended (to get from pharmacy, though doesn't have Medicare part  D, so will be expensive).  Continue yearly high dose flu shots.  Colon cancer screening recommendations reviewed--UTD F/u with Dr. Helane Rima for GYN exam as scheduled in March. To f/u with Dr. Buddy Duty to discuss the DEXA results, treatment recommendations.  If she needs referral to allergist to evaluate salmon allergy, she will let us know--but not to pursue this until she discusses treatment recommendations with Dr. Buddy Duty.   MOST form reviewed, Full Code, Full Care She may consider making changes--advised to discuss with her husband/family, and we can void and if she changes her wishes, whenever she wants.    Medicare Attestation I have personally reviewed: The patient's medical and social history Their use of alcohol, tobacco or illicit drugs Their current medications and supplements The patient's functional ability including ADLs,fall risks, home safety risks, cognitive, and hearing and visual impairment Diet and physical activities Evidence for depression or mood disorders  The patient's weight, height,  BMI have been recorded in the chart.  I have made referrals, counseling, and provided education to the patient based on review of the above and I have provided the patient with a written personalized care plan for preventive services.

## 2020-09-07 NOTE — Telephone Encounter (Signed)
Called and spoke with patient to schedule a palliative care home visit. Visit scheduled for 09/10/19@1p .

## 2020-09-07 NOTE — Patient Instructions (Addendum)
HEALTH MAINTENANCE RECOMMENDATIONS:  It is recommended that you get at least 30 minutes of aerobic exercise at least 5 days/week (for weight loss, you may need as much as 60-90 minutes). This can be any activity that gets your heart rate up. This can be divided in 10-15 minute intervals if needed, but try and build up your endurance at least once a week.  Weight bearing exercise is also recommended twice weekly.  Eat a healthy diet with lots of vegetables, fruits and fiber.  "Colorful" foods have a lot of vitamins (ie green vegetables, tomatoes, red peppers, etc).  Limit sweet tea, regular sodas and alcoholic beverages, all of which has a lot of calories and sugar.  Up to 1 alcoholic drink daily may be beneficial for women (unless trying to lose weight, watch sugars).  Drink a lot of water.  Calcium recommendations are 1200-1500 mg daily (1500 mg for postmenopausal women or women without ovaries), and vitamin D 1000 IU daily.  This should be obtained from diet and/or supplements (vitamins), and calcium should not be taken all at once, but in divided doses.  Monthly self breast exams and yearly mammograms for women over the age of 59 is recommended.  Sunscreen of at least SPF 30 should be used on all sun-exposed parts of the skin when outside between the hours of 10 am and 4 pm (not just when at beach or pool, but even with exercise, golf, tennis, and yard work!)  Use a sunscreen that says "broad spectrum" so it covers both UVA and UVB rays, and make sure to reapply every 1-2 hours.  Remember to change the batteries in your smoke detectors when changing your clock times in the spring and fall. Carbon monoxide detectors are recommended for your home.  Use your seat belt every time you are in a car, and please drive safely and not be distracted with cell phones and texting while driving.   Tracy Malone , Thank you for taking time to come for your Medicare Wellness Visit. I appreciate your ongoing  commitment to your health goals. Please review the following plan we discussed and let me know if I can assist you in the future.    This is a list of the screening recommended for you and due dates:  Health Maintenance  Topic Date Due  . Tetanus Vaccine  09/24/2019  . Flu Shot  Completed  . DEXA scan (bone density measurement)  Completed  . COVID-19 Vaccine  Completed  . Pneumonia vaccines  Completed   Please get tetanus booster (TdaP) from the pharmacy.  Shingrix (shingles vaccine) is also recommended (get from pharmacy). This is a series of 2 shots, given 2 months apart.  It should be separated from other vaccines by at least 2-4 weeks. You may need to check around at different pharmacies for best pricing ([facility] is usually the lowest).  Consider yearly mammograms, as discussed with your GYN.  Take the Gas-X twice daily as recommended by your gastroenterologist.  Contact Dr. Cindra Eves office to ensure he got copy of DEXA done by Dr. Helane Rima.  Once he reviews this, you should plan on discussing your treatment options (and whether you need to see the allergist to evaluate for salmon allergy or not).  Consider starting the inderal for your tremor as prescribed by [name].  This is a very low dose, so hopefully won't lower your blood pressure.  If it does, you can decrease the dose (take it less often, or just 1/2  tablet).  You may want to further discuss the MOST form and possibility of DNR with the palliative care folks when they visit tomorrow.  If you want to make any changes, they may be able to help you, or let us know.  As it stands, we have that you would want CPR and full care.

## 2020-09-08 ENCOUNTER — Encounter: Payer: Self-pay | Admitting: Family Medicine

## 2020-09-08 ENCOUNTER — Other Ambulatory Visit: Payer: Self-pay | Admitting: *Deleted

## 2020-09-08 ENCOUNTER — Other Ambulatory Visit: Payer: Self-pay

## 2020-09-08 ENCOUNTER — Ambulatory Visit (INDEPENDENT_AMBULATORY_CARE_PROVIDER_SITE_OTHER): Payer: Medicare Other | Admitting: Family Medicine

## 2020-09-08 ENCOUNTER — Telehealth: Payer: Self-pay | Admitting: Neurology

## 2020-09-08 VITALS — BP 118/60 | HR 64 | Ht 59.0 in | Wt 99.4 lb

## 2020-09-08 DIAGNOSIS — G250 Essential tremor: Secondary | ICD-10-CM | POA: Diagnosis not present

## 2020-09-08 DIAGNOSIS — M810 Age-related osteoporosis without current pathological fracture: Secondary | ICD-10-CM | POA: Diagnosis not present

## 2020-09-08 DIAGNOSIS — Z0000 Encounter for general adult medical examination without abnormal findings: Secondary | ICD-10-CM | POA: Diagnosis not present

## 2020-09-08 DIAGNOSIS — G8929 Other chronic pain: Secondary | ICD-10-CM

## 2020-09-08 DIAGNOSIS — M545 Low back pain, unspecified: Secondary | ICD-10-CM | POA: Diagnosis not present

## 2020-09-08 DIAGNOSIS — E039 Hypothyroidism, unspecified: Secondary | ICD-10-CM | POA: Diagnosis not present

## 2020-09-08 DIAGNOSIS — F339 Major depressive disorder, recurrent, unspecified: Secondary | ICD-10-CM | POA: Diagnosis not present

## 2020-09-08 DIAGNOSIS — M791 Myalgia, unspecified site: Secondary | ICD-10-CM | POA: Diagnosis not present

## 2020-09-08 DIAGNOSIS — T466X5A Adverse effect of antihyperlipidemic and antiarteriosclerotic drugs, initial encounter: Secondary | ICD-10-CM

## 2020-09-08 DIAGNOSIS — G20 Parkinson's disease: Secondary | ICD-10-CM | POA: Diagnosis not present

## 2020-09-08 DIAGNOSIS — I700 Atherosclerosis of aorta: Secondary | ICD-10-CM | POA: Diagnosis not present

## 2020-09-08 DIAGNOSIS — G20A1 Parkinson's disease without dyskinesia, without mention of fluctuations: Secondary | ICD-10-CM

## 2020-09-08 DIAGNOSIS — K5909 Other constipation: Secondary | ICD-10-CM

## 2020-09-08 MED ORDER — PROPRANOLOL HCL 10 MG PO TABS
10.0000 mg | ORAL_TABLET | Freq: Three times a day (TID) | ORAL | 5 refills | Status: DC
Start: 2020-09-08 — End: 2021-02-21

## 2020-09-08 NOTE — Telephone Encounter (Signed)
Pt called, the medication you prescribed my last office visit for my tremors is not at the pharmacy. Pt could pronounce the medication. Would like a call from the nurse

## 2020-09-08 NOTE — Telephone Encounter (Signed)
Called pt back. She ended up not picking up propranolol 10mg  po TID that [name] rx'd at last visit. She was hesitant. She saw PCP this week who also recommended she try this. She would like rx re-sent. She is agreeable to try this for tremors. I e-scribed to Kristopher Oppenheim per pt request.

## 2020-09-09 ENCOUNTER — Encounter: Payer: Self-pay | Admitting: Family Medicine

## 2020-09-09 ENCOUNTER — Telehealth: Payer: Self-pay | Admitting: Adult Health

## 2020-09-09 ENCOUNTER — Other Ambulatory Visit: Payer: Medicare Other

## 2020-09-09 ENCOUNTER — Other Ambulatory Visit: Payer: Medicare Other | Admitting: *Deleted

## 2020-09-09 DIAGNOSIS — Z515 Encounter for palliative care: Secondary | ICD-10-CM

## 2020-09-09 NOTE — Progress Notes (Signed)
COMMUNITY PALLIATIVE CARE RN NOTE  PATIENT NAME: Tracy Malone DOB: 04-13-1932 MRN: [id number]  PRIMARY CARE PROVIDER: [name], MD  RESPONSIBLE PARTY: Tracy Malone (husband) Acct ID - Guarantor Home Phone Work Phone Relationship Acct Type  [id number] TAELAR, [phone] Self P/F     [address], [place], [place] [address]   Covid-19 Pre-screening Negative  PLAN OF CARE and INTERVENTION:  1. ADVANCE CARE PLANNING/GOALS OF CARE: Goal is for patient to remain at home with her husband and avoid hospitalizations. She is a Full code. 2. PATIENT/CAREGIVER EDUCATION: Symptom management, pain management, safe mobility, s/s of infection 3. DISEASE STATUS: Joint visit made with Palliative care MSW, [facility]. Met with patient and her husband in their home. Patient continues to c/o pain in her lower back, most prevalent on the left side. She says that pain radiates into her L hip and L buttocks then across to her other hip and down her right leg. She is currently participating in outpatient PT at [facility] 2x/week. She says she doesn't feel that it has been helping with the pain. She has difficulty finding a comfortable position whether sitting or standing. Her next appointment with Dr. Nelva Bush (Ortho) is next month. She remains ambulatory. She uses her walker at times during the night if she feels unsteady. No recent falls. She remains able to perform ADLs independently. Showering takes longer to do due to pain and fatigue. She has a shower chair she sits on to help. She tires very easily. She is frustrated with being unable to do more physical things she was once able to do. She had an appointment with her PCP yesterday and had a bone density test performed. She says that the results weren't good. She does not want to take any more Prolia injections because she feels this caused an increase in her pain. She states that she is going to see her Endocrinologist, Dr. Buddy Duty to seek  other options. She continues with her right hand tremor from her Parkinson's disease. She says that she was prescribed Propanolol to help with her tremor by [name] and it is waiting at the pharmacy now for her to pick it up and begin. She reports having a poor appetite, but tries to eat because she knows she needs to. She says however that her weight has been stable. She stays well hydrated. Bowel movements have been regular lately. She also states that she stopped taking her Prozac about 2 weeks ago because she feels it was causing her stomach to be upset. She has noticed that she has been more tearful since she stopped taking this. She is talking with her counselor every other week, which is helpful to her. Will continue to monitor.  HISTORY OF PRESENT ILLNESS: This is a 87 yo female with a diagnosis of Parkinson'sdisease.She has a h/o GERD, IBS, ischemic colitis, hypothyroidism, essential tremors and osteoporosis.Palliative care team continues to follow patient and will visit monthly and PRN.   CODE STATUS: Full code ADVANCED DIRECTIVES: Y MOST FORM: no PPS: 50%   PHYSICAL EXAM:   LUNGS: clear to auscultation  CARDIAC: Cor RRR EXTREMITIES: No edema SKIN: Denies any skin breakdown or issues at this time  NEURO: Alert and oriented x 3, generalized weakness, engaging, ambulatory    (Duration of visit and documentation 45 minutes)    [name], RN, BSN

## 2020-09-09 NOTE — Telephone Encounter (Signed)
She can restart if symptoms have resolved.

## 2020-09-09 NOTE — Telephone Encounter (Signed)
Patient aware and she will restart tomorrow. Reports she just cries for nothing, became tearful on the phone. Advised her to call back if symptoms not improving.

## 2020-09-09 NOTE — Telephone Encounter (Signed)
Next visit is 09/24/20. Tracy Malone has been off of Prozac for two weeks due to stomach problems. It was two weeks this past Saturday. Can she go back on the Prozac now? Her number is [phone].

## 2020-09-09 NOTE — Progress Notes (Signed)
COMMUNITY PALLIATIVE CARE SW NOTE  PATIENT NAME: Tracy Malone DOB: 04-27-1932 MRN: [id number]  PRIMARY CARE PROVIDER: [name], MD  RESPONSIBLE PARTY:  Acct ID - Guarantor Home Phone Work Phone Relationship Acct Type  [id number] [name] Self P/F     [address], [place], [place] [address]     PLAN OF CARE and INTERVENTIONS:             1. GOALS OF CARE/ ADVANCE CARE PLANNING:  Patient is a DNR. The goal is for patient to remain in her home safely and as independent as possible. 1.  2. SOCIAL/EMOTIONAL/SPIRITUAL ASSESSMENT/ INTERVENTIONS:  SW completed a face-to-face visit with patient at her home. Her husband was present with her. She entered the room slowly and holding her back. She report that her back has been bothering her. Patient report persistent pain to her back despite her therapy. Patient described her pain as rigid. She continues to receive physical therapy 2x/week. SW observed involuntary shaking in her hands. Her appetite remains poor, but her fluid intake is good. She continues to do her own personal care, but requires standby assistance from her husband. She report that her weight is stable. She report increased fatigue and weakness. SW provided supportive presence and counseling, active listening and reassurance of ongoing support.  2.  1. PATIENT/CAREGIVER EDUCATION/ COPING:  Patient has a supportive family. 3.  4. PERSONAL EMERGENCY PLAN:  911 can activated for emergencies. 5. COMMUNITY RESOURCES COORDINATION/ HEALTH CARE NAVIGATION:  Patient is receiving physical therapy 2x week.  6. FINANCIAL/LEGAL CONCERNS/INTERVENTIONS:  None.     SOCIAL HX:  Social History   Tobacco Use  . Smoking status: Never Smoker  . Smokeless tobacco: Never Used  Substance Use Topics  . Alcohol use: No    Alcohol/week: 0.0 standard drinks    CODE STATUS: DNR ADVANCED DIRECTIVES: No MOST FORM COMPLETE:  No HOSPICE EDUCATION PROVIDED: No  PPS:  Patient continues to ambulate independently. She is independent for personal care needs.  Patient is having persistent pain to her back. She is alert and oriented x3.  Duration of visit is 60 minutes      [employer], LCSW

## 2020-09-10 DIAGNOSIS — M545 Low back pain, unspecified: Secondary | ICD-10-CM | POA: Diagnosis not present

## 2020-09-13 DIAGNOSIS — M545 Low back pain, unspecified: Secondary | ICD-10-CM | POA: Diagnosis not present

## 2020-09-14 ENCOUNTER — Ambulatory Visit: Payer: Medicare Other | Admitting: Psychiatry

## 2020-09-14 DIAGNOSIS — H524 Presbyopia: Secondary | ICD-10-CM | POA: Diagnosis not present

## 2020-09-14 DIAGNOSIS — H43813 Vitreous degeneration, bilateral: Secondary | ICD-10-CM | POA: Diagnosis not present

## 2020-09-14 DIAGNOSIS — Z961 Presence of intraocular lens: Secondary | ICD-10-CM | POA: Diagnosis not present

## 2020-09-14 DIAGNOSIS — H0100A Unspecified blepharitis right eye, upper and lower eyelids: Secondary | ICD-10-CM | POA: Diagnosis not present

## 2020-09-15 ENCOUNTER — Other Ambulatory Visit: Payer: Self-pay

## 2020-09-15 ENCOUNTER — Ambulatory Visit (INDEPENDENT_AMBULATORY_CARE_PROVIDER_SITE_OTHER): Payer: Medicare Other | Admitting: Psychiatry

## 2020-09-15 DIAGNOSIS — F411 Generalized anxiety disorder: Secondary | ICD-10-CM | POA: Diagnosis not present

## 2020-09-15 NOTE — Progress Notes (Signed)
[facility] Counselor/Therapist Progress Note  Patient ID: Tracy Malone, MRN: [id number],    Date: 09/15/2020  Time Spent: 60 minutes      3:00pm to 4:00pm  Treatment Type: Individual Therapy  Reported Symptoms: Anxiety, depression, sadness  Mental Status Exam:  Appearance:   Well Groomed     Behavior:  Appropriate, Sharing and Motivated  Motor:  Normal, does fine with her rollater  Speech/Language:   Clear and Coherent  Affect:  anxious, depressed  Mood:  anxious, depressed and sad  Thought process:  some tangentiality  Thought content:    some ruminating  Sensory/Perceptual disturbances:    WNL  Orientation:  oriented to person, place, time/date, situation, day of week, month of year and year  Attention:  Good  Concentration:  Good and Fair  Memory:  WNL  Fund of knowledge:   Good  Insight:    Good and Fair  Judgment:   Good  Impulse Control:  Good   Risk Assessment: Danger to Self:  No Self-injurious Behavior: No Danger to Others: No Duty to Warn:no Physical Aggression / Violence:No  Access to Firearms a concern: No  Gang Involvement:No   Subjective: Patient in today reporting anxiety, depression, and sadness regarding personal and family situations.  Interventions: Solution-Oriented/Positive Psychology and Ego-Supportive  Diagnosis:   ICD-10-CM   1. Generalized anxiety disorder  F41.1      Plan of Care: Patient not signing treatment plan on computer screen due to COVID.  Treatment Goals: Treatment goals will remain on treatment plan as patient works with strategies to achieve her goals. Progress will be noted each session and documented in the "progress" section of note.  Long term goal: Develop the ability to recognize, accept, and cope with feelings of depression.  Short term goal: Verbalize any unresolved grief issues that may be contributing to depression.  Strategy: Replace negative self-defeating self talk with verbalization of  realistic and positive cognitive messages to lessen depression and improve mood.  Progress: Patient in today reporting anxiety, depression, and sadness regarding personal/family issues. Concerns are related to her family and personal issues. Her over-thinking is worse and leads to more tearfulness per patient and per husband when speaking with him after patient's appt. Lower back continues and that is "aggravating my depression and nothing seems to help the back."  Is to see Dr. Nelva Bush in early March for further evaluation. Processing more of her grief today about incarcerated son in [place], and states some days it's worse than others. Does think he may get early release based on what family has heard, and talked about her trying to focus more on what she can control versus cannot control, and also focusing on the more encouraging information family has been told about likely "early release for her son". Discussed this with patient and she admits that the positive is hard to hang onto as "it's not happening right now."  She does report further decrease in her self-defeating self talk and that she is trying to be more positive towards herself.  Is looking forward to her husband's 92nd birthday next month.  Very little tearfulness in session today and she talked almost nonstop and did a good job of explaining her concerns and trying to work on some more positive cognitions.  That is a challenge for patient but she does put forth good effort.  Focus of session and behaviors patient is working on between sessions: Working to hold onto more positive thought patterns. Excepting  things that she cannot change. Believe that she can make some changes rather than saying "I cannot change". Corrupting and replacing anxious/fearful thoughts with more reality based thoughts. Attentionally focusing on more positives and negatives daily. Work with strategies we discussed today in session to manage anxiety and depression  better. Stay in the present versus worrying about the future. Be determined and persistent in doing things differently that can help her. As weather permits, get outsite some daily even for brief periods.   Goal review and progress/challenges noted with patient.  Next appointment within 2 to 3 weeks.   Shanon Ace, LCSW

## 2020-09-16 DIAGNOSIS — M545 Low back pain, unspecified: Secondary | ICD-10-CM | POA: Diagnosis not present

## 2020-09-20 DIAGNOSIS — M545 Low back pain, unspecified: Secondary | ICD-10-CM | POA: Diagnosis not present

## 2020-09-23 DIAGNOSIS — M4856XA Collapsed vertebra, not elsewhere classified, lumbar region, initial encounter for fracture: Secondary | ICD-10-CM | POA: Diagnosis not present

## 2020-09-23 DIAGNOSIS — M5136 Other intervertebral disc degeneration, lumbar region: Secondary | ICD-10-CM | POA: Diagnosis not present

## 2020-09-24 ENCOUNTER — Other Ambulatory Visit: Payer: Self-pay

## 2020-09-24 ENCOUNTER — Ambulatory Visit (INDEPENDENT_AMBULATORY_CARE_PROVIDER_SITE_OTHER): Payer: Medicare Other | Admitting: Adult Health

## 2020-09-24 ENCOUNTER — Encounter: Payer: Self-pay | Admitting: Adult Health

## 2020-09-24 DIAGNOSIS — F411 Generalized anxiety disorder: Secondary | ICD-10-CM

## 2020-09-24 DIAGNOSIS — F331 Major depressive disorder, recurrent, moderate: Secondary | ICD-10-CM | POA: Diagnosis not present

## 2020-09-24 MED ORDER — ALPRAZOLAM 1 MG PO TABS: 1.0000 mg | ORAL_TABLET | Freq: Two times a day (BID) | ORAL | 2 refills | Status: DC | PRN

## 2020-09-24 MED ORDER — ALPRAZOLAM 0.5 MG PO TABS: ORAL_TABLET | ORAL | 2 refills | Status: DC

## 2020-09-24 NOTE — Progress Notes (Signed)
Tracy Malone [id number] Dec 28, 1931 87 y.o.  Subjective:   Patient ID:  Tracy Malone is a 87 y.o. (DOB 1932/03/29) female.  Chief Complaint: No chief complaint on file.   HPI   Accompanied by husband.  Tracy Malone presents to the office today for follow-up of MDD, GAD, and insomnia.  Describes mood today as "not too good". Pleasant. Tearful throughout interview. Mood symptoms - reports depression, anxiety, and irritability. Stating "I feel very emotional". Worried about husband - "having health issues". Feeling "helpless and hopeless" at times. Diagnosed with Parkinson's in 2017. Started on Carba-Dopa Levodopa on 10/21. Back surgery a year ago. Getting overwhelmed at times. Stating "there are too many things to think about and I can't do it anymore". Started on Prozac 10mg  2 weeks ago. Diagnosed with Parkinson's in 2017. Started on Carba-Dopa Levodopa on 10/21. Back surgery a year ago. Varying interest and motivation. Taking medications as prescribed. Seeing therapist. Energy levels lower. More active at home. Using a rolling walker. Active, does not have a regular exercise routine. Enjoys some usual interests and activities. Married. Lives with husband of 108 years. 2 children 59 and 55. Son lives in [place]. Spending time with family. Appetite adequate. Weight stable. Sleeping difficulties. Averages 3 to 4 hours - back pain. Focus and concentration stable. Completing tasks. Managing aspects of household. Retired.  Denies SI or HI.  Denies AH or VH.  Previous medication trials: Multiple medication trials   PHQ2-9   [facility] Office Visit from 09/08/2020 in [facility] Visit from 09/03/2019 in [facility] Patient Outreach Telephone from 12/02/2018 in [facility] Visit from 08/29/2018 in [facility] Visit from 08/27/2017 in [facility]  PHQ-2 Total Score 6 6 1 4 4   PHQ-9 Total Score 7 17 -- -- --        Review of Systems:  Review of Systems  Musculoskeletal: Negative for gait problem.  Neurological: Negative for tremors.  Psychiatric/Behavioral:       Please refer to HPI    Medications: I have reviewed the patient's current medications.  Current Outpatient Medications  Medication Sig Dispense Refill  . acetaminophen (TYLENOL) 650 MG CR tablet Take 650 mg by mouth every 8 (eight) hours as needed for pain.  (Patient not taking: Reported on 09/08/2020)    . ALPRAZolam (XANAX) 0.5 MG tablet Take four tablets daily as needed for anxiety. 120 tablet 2  . b complex vitamins tablet Take 1 tablet by mouth daily.    . calcium carbonate (OSCAL) 1500 (600 Ca) MG TABS tablet Take 600 mg by mouth 2 (two) times daily.    . Carbidopa-Levodopa ER (SINEMET CR) 25-100 MG tablet controlled release Take 1 tablet by mouth in the morning, at noon, in the evening, and at bedtime. 120 tablet 11  . Cholecalciferol (VITAMIN D) 2000 UNITS tablet Take 2,000 Units by mouth daily.    Marland Kitchen dicyclomine (BENTYL) 10 MG capsule As needed (Patient not taking: Reported on 09/08/2020)    . FLUoxetine (PROZAC) 10 MG capsule Take 10 mg by mouth daily. (Patient not taking: Reported on 09/08/2020)    . Probiotic Product (ALIGN PO) Take 1 capsule by mouth daily. (Patient not taking: Reported on 09/08/2020)    . propranolol (INDERAL) 10 MG tablet Take 1 tablet (10 mg total) by mouth 3 (three) times daily. 90 tablet 5  . SYNTHROID 25 MCG tablet TAKE 1 TABLET DAILY BEFORE BREAKFAST FOR HYPOTHYROIDISM 90 tablet 0  Current Facility-Administered Medications  Medication Dose Route Frequency Provider Last Rate Last Admin  . 0.9 %  sodium chloride infusion  500 mL Intravenous Once Milus Banister, MD        Medication Side Effects: None  Allergies:  Allergies  Allergen Reactions  . Iodine Anaphylaxis    IV and topical forms. Other reaction(s): Unknown  . Levsin [Hyoscyamine Sulfate]     Vision problems/pt has glaucoma  . Salmon  [Fish Allergy] Hives and Shortness Of Breath  . Shellfish Allergy Anaphylaxis  . Tramadol Swelling  . Remeron [Mirtazapine] Other (See Comments)    Cause blurred vision and red eyes, pt has glaucoma  . Aspirin Other (See Comments)    Sever stomach pain due to ulcer scaring.  . Bis Subcit-Metronid-Tetracyc Swelling    Tongue swelling. Face tingling Other reaction(s): Unknown  . Cephalexin Hives    Other reaction(s): hives  . Ciprofloxacin Diarrhea  . Codeine Nausea And Vomiting  . Contrast Media [Iodinated Diagnostic Agents]   . Cyclobenzaprine Other (See Comments)    Tingly/prickly sensation. Other reaction(s): tingly/prickly sensation  . Darvocet [Propoxyphene N-Acetaminophen] Nausea And Vomiting  . Demerol [Meperidine] Nausea Only  . Dexlansoprazole Swelling    Redness, swelling and peeling of both feet. Other reaction(s): foot pain  . Diphedryl [Diphenhydramine] Other (See Comments)    Increased pulse/small amount ok  . Doxycycline Hyclate Other (See Comments)    GI intolerance.  . Doxycycline Hyclate     Other reaction(s): GI intolerance  . Epinephrine Other (See Comments)    Breathing problems Other reaction(s): breathing problems/fainting  . Erythromycin Other (See Comments)    GI intolerance. Other reaction(s): GI  . Fish Oil     Other reaction(s): breathing problems/hives  . Hyoscyamine     Other reaction(s): eye pain  . Latex Other (See Comments)    Gloves ok.  Skin gets red from elastic in underwear and latex bandaides.  . Meperidine Hcl     Other reaction(s): vomiting  . Nitrofurantoin Diarrhea  . Other     Other reaction(s): migraines Other reaction(s): Unknown Other reaction(s): Unknown Other reaction(s): Unknown Other reaction(s): increased pulse, faint, diarrhea  . Prednisone Other (See Comments)    Headache Other reaction(s): headache  . Ra Diphedryl Allergy [Diphenhydramine Hcl]     Other reaction(s): increased pulse small dose okay  .  Sertraline Hcl Swelling and Other (See Comments)    Migraine Swelling of tongue/lip (09/2012) Other reaction(s): Unknown  . Shellfish-Derived Products     Other reaction(s): Unknown  . Sulfa Antibiotics Other (See Comments)    Increased pulse, fainting, diarrhea, thrush  . Wellbutrin [Bupropion] Other (See Comments)    Headaches  . Xylocaine [Lidocaine Hcl]     With epinephrine, given by dentist.  Speeded up heart rate and she passed out (occured twice, at dentist)  . Xylocaine [Lidocaine]     Other reaction(s): Unknown  . Biaxin [Clarithromycin] Rash    Started after completing 10 day course of 2000 mg /day, Lips swelling  . Ibuprofen Other (See Comments)    Motrin ok with a GI effect. Other reaction(s): rash Motrin okay with a GI effect    Past Medical History:  Diagnosis Date  . Bell's palsy 1966   Hx: right side facial droop, resolved per patient 04/02/19  . Carotid artery disease ([facility]) 2010   on vascular screening;unchanged 2013.(could not tolerate simvastatin, no other statins tried)--<30% blockage bilat 07/2011  . Chronic abdominal pain   . Chronic  fatigue and malaise   . Claustrophobia   . Depression    treated in the past for years;stopped in 2010 for a years  . Duodenal ulcer 1962   h/o  . Dysrhythmia    ocassional PVC's, no current problems per patient on 04/02/19  . Fibromyalgia   . Frequent PVCs 07/2012   Seen by [facility]: benign, asymptomatic, normal EF  . GERD (gastroesophageal reflux disease)    diet controlled  . Glaucoma, narrow-angle    s/p laser surgery  . History of hiatal hernia    during endoscopy  . Hypothyroid 9/08  . IBS (irritable bowel syndrome)    Dr. Benson [place]  . Ischemic colitis ([facility]) 11/21/2018   no current problems per patient on 04/02/19  . Ocular migraine   . Osteoporosis 10/11   Dr.Hawkes  . Panic attack   . Parkinson disease ([facility])   . Parkinson's disease ([facility]) 06/23/2016  . Recurrent UTI    has cystocele-Dr.Grewal  . Shingles 1999    h/o  . Superficial thrombophlebitis 03/2009   RLE  . Trochanteric bursitis 12/2008   bilateral    Family History  Problem Relation Age of Onset  . Heart disease Mother   . Hypertension Mother   . Hypertension Sister   . HIV Son   . Heart disease Brother   . Lung cancer Brother        lung  . Diabetes Maternal Grandfather   . Diabetes Granddaughter        type 1  . Colon cancer Neg Hx   . Esophageal cancer Neg Hx   . Prostate cancer Neg Hx   . Stomach cancer Neg Hx   . Rectal cancer Neg Hx     Social History   Socioeconomic History  . Marital status: Married    Spouse name: Gwyndolyn Saxon  . Number of children: 2  . Years of education: Not on file  . Highest education level: Not on file  Occupational History  . Occupation: retired (school system)  Tobacco Use  . Smoking status: Never Smoker  . Smokeless tobacco: Never Used  Vaping Use  . Vaping Use: Never used  Substance and Sexual Activity  . Alcohol use: No    Alcohol/week: 0.0 standard drinks  . Drug use: No  . Sexual activity: Not Currently    Partners: Male    Birth control/protection: Other-see comments    Comment: Hysterectomy  Other Topics Concern  . Not on file  Social History Narrative   Married.  Son lives in [place]; Daughter [name] lives in [place]; 2 grandchildren   Social Determinants of Health   Financial Resource Strain: Not on file  Food Insecurity: Not on file  Transportation Needs: Not on file  Physical Activity: Not on file  Stress: Not on file  Social Connections: Not on file  Intimate Partner Violence: Not on file    Past Medical History, Surgical history, Social history, and Family history were reviewed and updated as appropriate.   Please see review of systems for further details on the patient's review from today.   Objective:   Physical Exam:  LMP  (LMP Unknown)   Physical Exam Constitutional:      General: She is not in acute distress. Musculoskeletal:        General: No  deformity.  Neurological:     Mental Status: She is alert and oriented to person, place, and time.     Coordination: Coordination normal.  Psychiatric:  Attention and Perception: Attention and perception normal. She does not perceive auditory or visual hallucinations.        Mood and Affect: Mood normal. Mood is not anxious or depressed. Affect is not labile, blunt, angry or inappropriate.        Speech: Speech normal.        Behavior: Behavior normal.        Thought Content: Thought content normal. Thought content is not paranoid or delusional. Thought content does not include homicidal or suicidal ideation. Thought content does not include homicidal or suicidal plan.        Cognition and Memory: Cognition and memory normal.        Judgment: Judgment normal.     Comments: Insight intact     Lab Review:     Component Value Date/Time   NA 142 03/16/2020 1153   NA 141 09/03/2019 1117   K 4.2 03/16/2020 1153   CL 106 03/16/2020 1153   CO2 27 03/16/2020 1153   GLUCOSE 97 03/16/2020 1153   BUN 10 03/16/2020 1153   BUN 14 09/03/2019 1117   CREATININE 0.62 03/16/2020 1153   CREATININE 0.75 10/22/2018 1401   CREATININE 0.67 11/23/2016 0815   CALCIUM 9.6 03/16/2020 1153   PROT 6.8 09/03/2019 1117   ALBUMIN 4.2 09/03/2019 1117   AST 19 09/03/2019 1117   AST 17 10/22/2018 1401   ALT 4 09/03/2019 1117   ALT 7 10/22/2018 1401   ALKPHOS 83 09/03/2019 1117   BILITOT 1.0 09/03/2019 1117   BILITOT 0.8 10/22/2018 1401   GFRNONAA >60 03/16/2020 1153   GFRNONAA >60 10/22/2018 1401   GFRAA >60 03/16/2020 1153   GFRAA >60 10/22/2018 1401       Component Value Date/Time   WBC 8.7 03/16/2020 1153   RBC 4.85 03/16/2020 1153   HGB 14.3 03/16/2020 1153   HGB 14.2 01/22/2020 1213   HCT 45.5 03/16/2020 1153   HCT 42.5 01/22/2020 1213   PLT 316 03/16/2020 1153   PLT 268 01/22/2020 1213   MCV 93.8 03/16/2020 1153   MCV 91 01/22/2020 1213   MCH 29.5 03/16/2020 1153   MCHC 31.4  03/16/2020 1153   RDW 13.1 03/16/2020 1153   RDW 12.5 01/22/2020 1213   LYMPHSABS 1.8 01/22/2020 1213   MONOABS 0.9 04/08/2019 0516   EOSABS 0.2 01/22/2020 1213   BASOSABS 0.1 01/22/2020 1213    No results found for: POCLITH, LITHIUM   No results found for: PHENYTOIN, PHENOBARB, VALPROATE, CBMZ   .res Assessment: Plan:     Plan:  PDMP reviewed  1. Xanax 0.5mg  - 4 x daily prn anxiety    2  Prozac 10mg  daily   RTC 4 weeks  Patient advised to contact office with any questions, adverse effects, or acute worsening in signs and symptoms.  Discussed potential benefits, risk, and side effects of benzodiazepines to include potential risk of tolerance and dependence, as well as possible drowsiness.  Advised patient not to drive if experiencing drowsiness and to take lowest possible effective dose to minimize risk of dependence and tolerance.  Diagnoses and all orders for this visit:  Major depressive disorder, recurrent episode, moderate (HCC)  Generalized anxiety disorder -     Discontinue: ALPRAZolam (XANAX) 1 MG tablet; Take 1 tablet (1 mg total) by mouth 2 (two) times daily as needed for anxiety. -     ALPRAZolam (XANAX) 0.5 MG tablet; Take four tablets daily as needed for anxiety.     Please see  After Visit Summary for patient specific instructions.  Future Appointments  Date Time Provider [place]  09/28/2020  3:00 PM Shanon Ace, LCSW [facility] None  10/12/2020  3:00 PM Shanon Ace, LCSW [facility] None  10/26/2020  3:00 PM Shanon Ace, LCSW [facility] None  11/09/2020  3:00 PM Shanon Ace, LCSW [facility] None  02/16/2021  3:00 PM Debbora Presto, NP [facility] None  03/10/2021  3:00 PM [name], MD [facility]    No orders of the defined types were placed in this encounter.   -------------------------------

## 2020-09-27 DIAGNOSIS — M545 Low back pain, unspecified: Secondary | ICD-10-CM | POA: Diagnosis not present

## 2020-09-28 ENCOUNTER — Other Ambulatory Visit: Payer: Self-pay

## 2020-09-28 ENCOUNTER — Ambulatory Visit (INDEPENDENT_AMBULATORY_CARE_PROVIDER_SITE_OTHER): Payer: Medicare Other | Admitting: Psychiatry

## 2020-09-28 DIAGNOSIS — F411 Generalized anxiety disorder: Secondary | ICD-10-CM

## 2020-09-28 NOTE — Progress Notes (Deleted)
      [facility] Counselor/Therapist Progress Note  Patient ID: UMEKA WRENCH, MRN: [id number],    Date: 09/28/2020  Time Spent: ***   Treatment Type: {[facility] AMB THERAPY [phone]}  Reported Symptoms: ***  Mental Status Exam:  Appearance:   {PSY:22683}     Behavior:  {PSY:21022743}  Motor:  {PSY:22302}  Speech/Language:   {PSY:22685}  Affect:  {PSY:22687}  Mood:  {PSY:31886}  Thought process:  {PSY:31888}  Thought content:    {PSY:7063309933}  Sensory/Perceptual disturbances:    {[phone] [phone]}  Orientation:  {PSY:30297}  Attention:  {PSY:22877}  Concentration:  {PSY:717-756-1798}  Memory:  {PSY:438-310-1900}  Fund of knowledge:   {PSY:717-756-1798}  Insight:    {PSY:717-756-1798}  Judgment:   {PSY:717-756-1798}  Impulse Control:  {PSY:717-756-1798}   Risk Assessment: Danger to Self:  {PSY:22692} Self-injurious Behavior: {PSY:22692} Danger to Others: {PSY:22692} Duty to Warn:{PSY:311194} Physical Aggression / Violence:{PSY:21197} Access to Firearms a concern: {PSY:21197} Gang Involvement:{PSY:21197}  Subjective: ***   Interventions: {PSY:613-868-8988}  Diagnosis:No diagnosis found.  Plan: ***  Shanon Ace, LCSW

## 2020-09-28 NOTE — Progress Notes (Signed)
[facility] Counselor/Therapist Progress Note  Patient ID: Tracy Malone, MRN: [id number],    Date: 09/28/2020  Time Spent:  60 minutes    3:00pm to 4:00pm  Treatment Type: Individual Therapy  Reported Symptoms: Patient reporting anxiety as her strongest symptom, but also struggling with depression and some sadness.  Mental Status Exam:  Appearance:   Casual     Behavior:  Appropriate, Sharing and Motivated  Motor:  uses a rollater to walk  Speech/Language:   Clear and Coherent  Affect:  anxious, depressed  Mood:  anxious and depressed  Thought process:  some tangentiality  Thought content:    Rumination  Sensory/Perceptual disturbances:    WNL  Orientation:  oriented to person, place, time/date, situation, day of week, month of year and year  Attention:  Good  Concentration:  Good and Fair  Memory:  [place] of knowledge:   Good  Insight:    Good and Fair  Judgment:   Good  Impulse Control:  Good/Fair   Risk Assessment: Danger to Self:  No Self-injurious Behavior: No Danger to Others: No Duty to Warn:no Physical Aggression / Violence:No  Access to Firearms a concern: No  Gang Involvement:No   Subjective: Patient in today reporting anxiety and depression.  States her anxiety is a stronger symptom right now.  Interventions: Cognitive Behavioral Therapy and Solution-Oriented/Positive Psychology  Diagnosis:   ICD-10-CM   1. Generalized anxiety disorder  F41.1     Plan of Care: Patient not signing treatment plan on computer screen due to COVID.  Treatment Goals: Treatment goals will remain on treatment plan as patient works with strategies to achieve her goals. Progress will be noted each session and documented in the "progress" section of note.  Long term goal: Develop the ability to recognize, accept, and cope with feelings of depression.  Short term goal: Verbalize any unresolved grief issues that may be contributing to  depression.  Strategy: Replace negative self-defeating self talk with verbalization of realistic and positive cognitive messages to lessen depression and improve mood.  Progress: Patient in today reporting anxiety and depression. Became very tearful when explaining about a problem at their bank where her money is "on hold" due to banking issue with banks making a transition and there's a temporary hold.  Hard for patient to trust bank but also admits she has no reason to distrust them.  Was very upset and needed most of the session to be able to that her feelings and frustrations and distressed in this situation and eventually was able to calm down and at least partially grasp the idea that [name] do sometimes have technical issues and that it sounded like per her husband, that that is what happened this time.  He also had tried to reassure her that the bank would have their system straightened out and things would be restored, per report the Bank had told him.  By session and, she was more grounded and was able to connect with some of the "positives" that I mentioned that was related to her and her husband and other family members.  We also discussed the CBT principle relating" thoughts leading to feelings" and how she can understand that and hold onto it for a while, it is hard for her to maintain that hold on it.  Patient asked if she could take a picture of the posterior in my office demonstrating CBT which she did and plans to take it home and use it there  as well.  Goal related behaviors patient is to work on between sessions: Recognizing and holding onto more positive thought patterns. Intentionally focusing on more positives daily. Interrupting and replacing anxious/fearful thoughts with more reality based thoughts. Accepting things she cannot change Believing that she can make changes. Stay in the present versus worrying about the future. Work with strategies discussed in sessions to better  manage anxiety. As weather permits get outside daily even for brief periods of time.   Goal review and progress/challenges noted with patient.  Next appointment within 2 to 3 weeks.   Shanon Ace, LCSW

## 2020-09-29 DIAGNOSIS — L853 Xerosis cutis: Secondary | ICD-10-CM | POA: Diagnosis not present

## 2020-09-29 DIAGNOSIS — L218 Other seborrheic dermatitis: Secondary | ICD-10-CM | POA: Diagnosis not present

## 2020-09-29 DIAGNOSIS — Z85828 Personal history of other malignant neoplasm of skin: Secondary | ICD-10-CM | POA: Diagnosis not present

## 2020-09-29 DIAGNOSIS — L821 Other seborrheic keratosis: Secondary | ICD-10-CM | POA: Diagnosis not present

## 2020-09-29 DIAGNOSIS — D1801 Hemangioma of skin and subcutaneous tissue: Secondary | ICD-10-CM | POA: Diagnosis not present

## 2020-09-30 DIAGNOSIS — M545 Low back pain, unspecified: Secondary | ICD-10-CM | POA: Diagnosis not present

## 2020-10-01 ENCOUNTER — Other Ambulatory Visit: Payer: Self-pay

## 2020-10-01 ENCOUNTER — Telehealth: Payer: Self-pay | Admitting: Adult Health

## 2020-10-01 MED ORDER — FLUOXETINE HCL 20 MG/5ML PO SOLN: 10.0000 mg | Freq: Every day | ORAL | 3 refills | Status: DC

## 2020-10-01 NOTE — Telephone Encounter (Signed)
What kind of pain?

## 2020-10-01 NOTE — Telephone Encounter (Signed)
Rx for Fluoxetine 20 mg/5 ml solution take 2.5 ml daily sent to [facility]. They had 1 bottle on the shelf available. Patient aware and hopefully this will be easier on her stomach. Pt was happy to try the liquid.

## 2020-10-01 NOTE — Telephone Encounter (Signed)
Noted  

## 2020-10-01 NOTE — Telephone Encounter (Signed)
Husband Bill LM on VM stating Pt in a lot of pain and needed to know what she can take. Contact # [phone]

## 2020-10-01 NOTE — Telephone Encounter (Signed)
Rtc to husband and he reports she was/is having leg pain, last night she had to get up and walk. I asked if it was any kind of restless leg and he reports no, her pain goes from legs, back, sometimes neck. This pain is not new. He also reports she had to stop the Prozac because it was too upsetting for her stomach, he's asking if there is something else to take instead?

## 2020-10-04 ENCOUNTER — Telehealth: Payer: Self-pay | Admitting: *Deleted

## 2020-10-04 NOTE — Telephone Encounter (Signed)
Called and spoke with patient to schedule a palliative care visit for both her and her [name]. Visit scheduled for 3/16@11a .

## 2020-10-06 ENCOUNTER — Other Ambulatory Visit: Payer: Medicare Other | Admitting: *Deleted

## 2020-10-06 ENCOUNTER — Other Ambulatory Visit: Payer: Self-pay

## 2020-10-06 VITALS — BP 121/70 | HR 72 | Temp 97.7°F | Resp 18

## 2020-10-06 DIAGNOSIS — Z515 Encounter for palliative care: Secondary | ICD-10-CM

## 2020-10-06 NOTE — Progress Notes (Signed)
Chief Complaint  Patient presents with   Shaking    Shakiness over the last week. Last 4 days of blood sugar readings, fasting were 120,118,118 and 119.     Monishia, nurse with palliative care, was at her house yesterday for a visit.  She sent message to our office stating that Nevea had been feeling jittery and weak over the last week.  Hands shaky, stomach feels jittery, she was tearful at the visit.   She has been check blood sugars (her daughter gave her meter--ran out of test strips, bought new ones but they didn't work). Sugar was 118 fasting, went up to 159 2 hours after eating.  She is concerned because this is high for her, usually running 99-101.  She wants to get her A1c checked. She has been craving the taste of sweet things.  She reports she felt jittery and weak, needed something sweet.  She ate something, didn't feel better. She has been feeling this way for a little over a week, only in the mornings, between 10:30 and 11am. Feeling weak x 2.5 weeks.  Stopped prozac capsules around that time.  She developed stomach cramps after being on prozac capsules (prescribed by Vilinda Boehringer).  Symptoms started on 6th day.  She took it for about 3 weeks, and then was told to stop it due to side effects (stomach pain and diarrhea).  She stopped was told to stop it for 2 weeks, and then start the liquid form of Prozac. She started the liquid on 3/12.  She is again having cramping in her stomach and diarrhea.  She plans to call psychiatrist to let her know. She has been depressed, crying.  She has been seeing her therapist.  Vitals were normal yesterday by palliative care nurse-- BP 121/70, P 72, R 18, O2 97% on RA.  PMH, PSH, SH reviewed  Outpatient Encounter Medications as of 10/07/2020  Medication Sig   ALPRAZolam (XANAX) 0.5 MG tablet Take four tablets daily as needed for anxiety.   Carbidopa-Levodopa ER (SINEMET CR) 25-100 MG tablet controlled release Take 1 tablet by mouth in the  morning, at noon, in the evening, and at bedtime.   SYNTHROID 25 MCG tablet TAKE 1 TABLET DAILY BEFORE BREAKFAST FOR HYPOTHYROIDISM   acetaminophen (TYLENOL) 650 MG CR tablet Take 650 mg by mouth every 8 (eight) hours as needed for pain.  (Patient not taking: No sig reported)   b complex vitamins tablet Take 1 tablet by mouth daily. (Patient not taking: Reported on 10/07/2020)   calcium carbonate (OSCAL) 1500 (600 Ca) MG TABS tablet Take 600 mg by mouth 2 (two) times daily. (Patient not taking: Reported on 10/07/2020)   Cholecalciferol (VITAMIN D) 2000 UNITS tablet Take 2,000 Units by mouth daily. (Patient not taking: Reported on 10/07/2020)   dicyclomine (BENTYL) 10 MG capsule As needed (Patient not taking: No sig reported)   FLUoxetine (PROZAC) 20 MG/5ML solution Take 2.5 mLs (10 mg total) by mouth daily. (Patient not taking: Reported on 10/07/2020)   Probiotic Product (ALIGN PO) Take 1 capsule by mouth daily. (Patient not taking: No sig reported)   propranolol (INDERAL) 10 MG tablet Take 1 tablet (10 mg total) by mouth 3 (three) times daily. (Patient not taking: Reported on 10/07/2020)   Facility-Administered Encounter Medications as of 10/07/2020  Medication   0.9 %  sodium chloride infusion   Allergies  Allergen Reactions   Iodine Anaphylaxis    IV and topical forms. Other reaction(s): Unknown   Levsin [Hyoscyamine  Sulfate]     Vision problems/pt has glaucoma   Salmon [Fish Allergy] Hives and Shortness Of Breath   Shellfish Allergy Anaphylaxis   Tramadol Swelling   Remeron [Mirtazapine] Other (See Comments)    Cause blurred vision and red eyes, pt has glaucoma   Aspirin Other (See Comments)    Sever stomach pain due to ulcer scaring.   Bis Subcit-Metronid-Tetracyc Swelling    Tongue swelling. Face tingling Other reaction(s): Unknown   Cephalexin Hives    Other reaction(s): hives   Ciprofloxacin Diarrhea   Codeine Nausea And Vomiting   Contrast Media [Iodinated  Diagnostic Agents]    Cyclobenzaprine Other (See Comments)    Tingly/prickly sensation. Other reaction(s): tingly/prickly sensation   Darvocet [Propoxyphene N-Acetaminophen] Nausea And Vomiting   Demerol [Meperidine] Nausea Only   Dexlansoprazole Swelling    Redness, swelling and peeling of both feet. Other reaction(s): foot pain   Diphedryl [Diphenhydramine] Other (See Comments)    Increased pulse/small amount ok   Doxycycline Hyclate Other (See Comments)    GI intolerance.   Doxycycline Hyclate     Other reaction(s): GI intolerance   Epinephrine Other (See Comments)    Breathing problems Other reaction(s): breathing problems/fainting   Erythromycin Other (See Comments)    GI intolerance. Other reaction(s): GI   Fish Oil     Other reaction(s): breathing problems/hives   Hyoscyamine     Other reaction(s): eye pain   Latex Other (See Comments)    Gloves ok.  Skin gets red from elastic in underwear and latex bandaides.   Meperidine Hcl     Other reaction(s): vomiting   Nitrofurantoin Diarrhea   Other     Other reaction(s): migraines Other reaction(s): Unknown Other reaction(s): Unknown Other reaction(s): Unknown Other reaction(s): increased pulse, faint, diarrhea   Prednisone Other (See Comments)    Headache Other reaction(s): headache   Ra Diphedryl Allergy [Diphenhydramine Hcl]     Other reaction(s): increased pulse small dose okay   Sertraline Hcl Swelling and Other (See Comments)    Migraine Swelling of tongue/lip (09/2012) Other reaction(s): Unknown   Shellfish-Derived Products     Other reaction(s): Unknown   Sulfa Antibiotics Other (See Comments)    Increased pulse, fainting, diarrhea, thrush   Wellbutrin [Bupropion] Other (See Comments)    Headaches   Xylocaine [Lidocaine Hcl]     With epinephrine, given by dentist.  Speeded up heart rate and she passed out (occured twice, at dentist)   Xylocaine [Lidocaine]     Other reaction(s):  Unknown   Biaxin [Clarithromycin] Rash    Started after completing 10 day course of 2000 mg /day, Lips swelling   Ibuprofen Other (See Comments)    Motrin ok with a GI effect. Other reaction(s): rash Motrin okay with a GI effect   She stopped her vitamins for the last 2 days   ROS:  No fever.  Mild chills, mainly just feels cold a lot.  No URI symptoms, chronic runny nose ("leaks"), no sneezing, no sore throat or cough.  Denies chest pain, palpitations (rare flutter in lower chest). No nausea, vomiting.  +diarrhea per HPI.  No blood or mucus in the stool.  Decreased appetite, but no significant weight changes. +depression.   PHYSICAL EXAM:  BP 120/70    Pulse 80    Ht 4\' 11"  (1.499 m)    Wt 98 lb 6.4 oz (44.6 kg)    LMP  (LMP Unknown)    BMI 19.87 kg/m   Wt Readings from  Last 3 Encounters:  10/07/20 98 lb 6.4 oz (44.6 kg)  09/08/20 99 lb 6.4 oz (45.1 kg)  08/18/20 99 lb 8 oz (45.1 kg)   Thin, elderly female, appears mildly depressed, but not tearful, and smiles intermittently. HEENT: conjunctiva and sclera are clear, EOMI, wearing mask She is alert and oriented. Mild tremor noted in hands, R>L, unchanged. Normal gait. Heart: regular rate and rhythm Lungs: clear bilaterally Abdomen: soft, normal bowel sounds.  Very mild tenderness at L abdomen, no rebound or guarding.  Lab Results  Component Value Date   HGBA1C 5.3 10/07/2020    ASSESSMENT/PLAN:  IFG (impaired fasting glucose) - based on home sugars.  Reassured A1c normal. Hasn't been noted to have IFG on labs, ?accuracy of monitor/strips. Pt reassured - Plan: HgB A1c  Depression, recurrent ([place]) - under care of psychiatrist and therapist.  Not tolerating Prozac well and will f/u with psych  Diarrhea, unspecified type - pt relates this to her prozac. She has had many chronic problems with GI issues, h/o IBS. Educated re: bland diet, avoid dairy since having diarrhea    Continue to drink a lot of water. It is  important for you to keep eating, even if you don't feel hungry or have much appetite. If you don't eat enough, your weakness will be worse. With diarrhea, be sure to avoid dairy. Stick with bland foods (not spicy, greasy, no cheese, milk, etc). Toast, grilled chicken, chicken noodle soup, crackers, bananas, rice are all fine to eat.  Your A1c was entirely normal (5.3). You do not have diabetes or pre-diabetes. You do not need to continue to check your blood sugars. If you crave sweets, it is fine to eat some!

## 2020-10-07 ENCOUNTER — Telehealth: Payer: Self-pay | Admitting: Neurology

## 2020-10-07 ENCOUNTER — Ambulatory Visit (INDEPENDENT_AMBULATORY_CARE_PROVIDER_SITE_OTHER): Payer: Medicare Other | Admitting: Family Medicine

## 2020-10-07 ENCOUNTER — Telehealth: Payer: Self-pay | Admitting: Adult Health

## 2020-10-07 ENCOUNTER — Encounter: Payer: Self-pay | Admitting: Family Medicine

## 2020-10-07 ENCOUNTER — Other Ambulatory Visit: Payer: Self-pay

## 2020-10-07 VITALS — BP 120/70 | HR 80 | Ht 59.0 in | Wt 98.4 lb

## 2020-10-07 DIAGNOSIS — R7301 Impaired fasting glucose: Secondary | ICD-10-CM

## 2020-10-07 DIAGNOSIS — F339 Major depressive disorder, recurrent, unspecified: Secondary | ICD-10-CM

## 2020-10-07 DIAGNOSIS — R197 Diarrhea, unspecified: Secondary | ICD-10-CM

## 2020-10-07 LAB — POCT GLYCOSYLATED HEMOGLOBIN (HGB A1C): Hemoglobin A1C: 5.3 % (ref 4.0–5.6)

## 2020-10-07 NOTE — Telephone Encounter (Signed)
Have her leave it off and lets not start anything until it is resolved.

## 2020-10-07 NOTE — Telephone Encounter (Signed)
Pt is asking for a call from RN  to discuss her diet

## 2020-10-07 NOTE — Patient Instructions (Signed)
Continue to drink a lot of water. It is important for you to keep eating, even if you don't feel hungry or have much appetite. If you don't eat enough, your weakness will be worse. With diarrhea, be sure to avoid dairy. Stick with bland foods (not spicy, greasy, no cheese, milk, etc). Toast, grilled chicken, chicken noodle soup, crackers, bananas, rice are all fine to eat.  Your A1c was entirely normal (5.3). You do not have diabetes or pre-diabetes. You do not need to continue to check your blood sugars. If you crave sweets, it is fine to eat some!

## 2020-10-07 NOTE — Telephone Encounter (Signed)
Called pt back. She asked to make sooner appt w/ AL,NP to discuss her diet. She is being told by PCP what she can and cannot eat and she is very overwhelmed/frustrated by it. Recommended she contact PCP back to see if they can refer her to a dietician. They would be a great resource for her to figure out what best for her to eat. She verbalized understanding and appreciation.

## 2020-10-07 NOTE — Telephone Encounter (Signed)
Please review

## 2020-10-07 NOTE — Telephone Encounter (Signed)
Pt called to report she tried liquid Prozac. Did not work, still having stomach issues. Contact # [phone]

## 2020-10-08 ENCOUNTER — Telehealth: Payer: Self-pay | Admitting: Adult Health

## 2020-10-08 DIAGNOSIS — M545 Low back pain, unspecified: Secondary | ICD-10-CM | POA: Diagnosis not present

## 2020-10-08 NOTE — Telephone Encounter (Signed)
Noted  

## 2020-10-08 NOTE — Telephone Encounter (Signed)
We stopped the Prozac and would like to give her GI issues a chance to resolve. We can look to start Zoloft if she wants - that may also cause GI issues - may come in a liquid form as well if interested.

## 2020-10-08 NOTE — Telephone Encounter (Signed)
Pt's daughter [name] called and would like to talk to you about Pt's meds. Contact# [phone]. Has release for her to be able to talk to you.

## 2020-10-08 NOTE — Telephone Encounter (Signed)
Pt has been informed.

## 2020-10-08 NOTE — Telephone Encounter (Signed)
Spoke to [place] and she informed me her mom is worried that there is no plan for her.She stopped taking the prozac because it made her have stomach issues.Manar mentioned she tried zoloft in the past and it helped her but daughter does not know how true that is.[name] wants to know what is the plan for her until her appointment.

## 2020-10-11 ENCOUNTER — Other Ambulatory Visit: Payer: Self-pay | Admitting: Adult Health

## 2020-10-11 DIAGNOSIS — M545 Low back pain, unspecified: Secondary | ICD-10-CM | POA: Diagnosis not present

## 2020-10-11 NOTE — Telephone Encounter (Signed)
I spoke to the daughter again and let her know.She wants to know what to try next.

## 2020-10-11 NOTE — Telephone Encounter (Signed)
She had an allergic reaction to Zoloft - listed in chart.

## 2020-10-11 NOTE — Telephone Encounter (Signed)
She will like generic Zoloft 25 mg sent to [facility] on file (pharmacist informed her this is the lowest dose) She also requested only a one week Rx to try it out first ,the pharmacy told her she can do this instead of paying for a month Rx without trying it first.

## 2020-10-11 NOTE — Telephone Encounter (Signed)
Wow,I will call her daughter and let her know that immediately

## 2020-10-11 NOTE — Telephone Encounter (Signed)
We will need to consider another option.

## 2020-10-11 NOTE — Telephone Encounter (Signed)
I will need to review her gene-sight testing. Has she tried viibryd or trintellex?

## 2020-10-12 ENCOUNTER — Ambulatory Visit (INDEPENDENT_AMBULATORY_CARE_PROVIDER_SITE_OTHER): Payer: Medicare Other | Admitting: Psychiatry

## 2020-10-12 ENCOUNTER — Other Ambulatory Visit: Payer: Self-pay | Admitting: Adult Health

## 2020-10-12 ENCOUNTER — Other Ambulatory Visit: Payer: Self-pay

## 2020-10-12 DIAGNOSIS — F331 Major depressive disorder, recurrent, moderate: Secondary | ICD-10-CM | POA: Diagnosis not present

## 2020-10-12 DIAGNOSIS — F411 Generalized anxiety disorder: Secondary | ICD-10-CM

## 2020-10-12 MED ORDER — CITALOPRAM HYDROBROMIDE 10 MG PO TABS: ORAL_TABLET | ORAL | 0 refills | Status: DC

## 2020-10-12 NOTE — Telephone Encounter (Signed)
Script sent  

## 2020-10-12 NOTE — Telephone Encounter (Signed)
She will like Rx sent to [facility] [address], [place]

## 2020-10-12 NOTE — Progress Notes (Signed)
[facility] Counselor/Therapist Progress Note  Patient ID: Tracy Malone, MRN: [id number],    Date: 10/12/2020  Time Spent: 50 minutes    3:00pm to 3:50pm   Treatment Type: Individual Therapy  Reported Symptoms: Anxiety, depression is the stronger symptom  Mental Status Exam:  Appearance:   Casual     Behavior:  Appropriate and Sharing  Motor:  slowed and walks using a rollater  Speech/Language:   Clear and Coherent  Affect:  anxious, depressed  Mood:  anxious and depressed  Thought process:  some tangentiality  Thought content:    Rumination and some tangentiality  Sensory/Perceptual disturbances:    WNL  Orientation:  oriented to person, place, time/date, situation, day of week, month of year and year  Attention:  Fair  Concentration:  Fair  Memory:  some forgetting at times  [employer] of knowledge:   Good  Insight:    Good and Fair  Judgment:   Good and Fair  Impulse Control:  Good   Risk Assessment: Danger to Self:  No Self-injurious Behavior: No Danger to Others: No Duty to Warn:no Physical Aggression / Violence:No  Access to Firearms a concern: No  Gang Involvement:No   Subjective:  Patient in today reporting depression as her main symptom and also anxiety.     Interventions: Solution-Oriented/Positive Psychology and Ego-Supportive  Diagnosis:   ICD-10-CM   1. Major depressive disorder, recurrent episode, moderate ([place])  F33.1      Plan of Care: Patient not signing treatment plan on computer screen due to [place].  Treatment Goals: Treatment goals will remain on treatment plan as patient works with strategies to achieve her goals. Progress will be noted each session and documented in the "progress" section of note.  Long term goal: Develop the ability to recognize, accept, and cope with feelings of depression.  Short term goal: Verbalize any unresolved grief issues that may be contributing to depression.  Strategy: Replace negative  self-defeating self talk with verbalization of realistic and positive cognitive messages to lessen depression and improve mood.  Progress: Patient in today reporting continued anxiety and depression. Some tearfulness briefly whenever she mentions her incarcerated son. Feels she can't really do much changing due to her physical problems and "my age". Tearfully processed a lot of her sadness especially about "her son that is in prison, and the war".  Some of patient's worries that she shared last session with the bank are being worked out and she was focus more today on her sign in the war.  States that she is not watching a lot of violence on TV.  Talked about several negative and distressing issues and was able to vent her feelings freely.  She did eventually get on a more positive track and able to talk more about some of the positives in her life which seemed to help a lot.  She does have difficulty in hanging onto the positives as she explained to me today.  We discussed a posterior MI office that is geared towards positivity and that seemed to help patient reverse her train of thought to be more open to positives.  She actually shared several positives herself, including some laughter.   Was more grounded and emotionally more upbeat by session and and encouraged to continue focusing more on positives in between sessions, getting out of her house a little more as she is able, and looking more for positives and negatives.  Her husband is a big support to her and  they have been together 67 years which she realizes is a significant "positive".  Goal directed behaviors patient is to work on between sessions Accepting the things she cannot change. Recognizing and holding onto more positive thought patterns. Believing that she can make changes. Looking intentionally for positives daily Interrupt and replace anxious/fearful thoughts with more reality-based thoughts. As weather permits, be outside more even  for brief periods of time. Stay in the present versus worrying about the future. Working with strategies discussed in sessions to better manage anxiety and depression. I  Goal review and progress/challenges noted with patient.  Next appointment within 2-3 weeks.   Shanon Ace, LCSW

## 2020-10-12 NOTE — Telephone Encounter (Signed)
Ok let's add Celexa 10mg  - 1/2 tablet daily.

## 2020-10-12 NOTE — Telephone Encounter (Signed)
Trintellex did not work,and she has not tried Viibryd.Tracy Malone has her gene sight results and she saw citalopram on there. She wants to know will this be a good option.She did research and saw that it did not cause GI issues.

## 2020-10-13 DIAGNOSIS — M545 Low back pain, unspecified: Secondary | ICD-10-CM | POA: Diagnosis not present

## 2020-10-13 NOTE — Telephone Encounter (Signed)
Reviewed

## 2020-10-18 DIAGNOSIS — M545 Low back pain, unspecified: Secondary | ICD-10-CM | POA: Diagnosis not present

## 2020-10-21 DIAGNOSIS — E063 Autoimmune thyroiditis: Secondary | ICD-10-CM | POA: Diagnosis not present

## 2020-10-21 DIAGNOSIS — M545 Low back pain, unspecified: Secondary | ICD-10-CM | POA: Diagnosis not present

## 2020-10-21 DIAGNOSIS — E039 Hypothyroidism, unspecified: Secondary | ICD-10-CM | POA: Diagnosis not present

## 2020-10-21 DIAGNOSIS — Z8781 Personal history of (healed) traumatic fracture: Secondary | ICD-10-CM | POA: Diagnosis not present

## 2020-10-21 DIAGNOSIS — Z9889 Other specified postprocedural states: Secondary | ICD-10-CM | POA: Diagnosis not present

## 2020-10-21 DIAGNOSIS — M810 Age-related osteoporosis without current pathological fracture: Secondary | ICD-10-CM | POA: Diagnosis not present

## 2020-10-22 DIAGNOSIS — Z01419 Encounter for gynecological examination (general) (routine) without abnormal findings: Secondary | ICD-10-CM | POA: Diagnosis not present

## 2020-10-22 DIAGNOSIS — Z6820 Body mass index (BMI) 20.0-20.9, adult: Secondary | ICD-10-CM | POA: Diagnosis not present

## 2020-10-26 ENCOUNTER — Ambulatory Visit (INDEPENDENT_AMBULATORY_CARE_PROVIDER_SITE_OTHER): Payer: Medicare Other | Admitting: Psychiatry

## 2020-10-26 ENCOUNTER — Other Ambulatory Visit: Payer: Self-pay

## 2020-10-26 DIAGNOSIS — F411 Generalized anxiety disorder: Secondary | ICD-10-CM | POA: Diagnosis not present

## 2020-10-26 NOTE — Progress Notes (Signed)
[facility] Counselor/Therapist Progress Note  Patient ID: Tracy Malone, MRN: [id number],    Date: 10/26/2020  Time Spent: 60 minutes   3:00pm to 4:00pm   Treatment Type: Individual Therapy  Reported Symptoms: anxiety, tearfulness, depression  Mental Status Exam:  Appearance:   Casual     Behavior:  Appropriate, Sharing and Motivated  Motor:  gets around with her rollater  Speech/Language:   Clear and Coherent  Affect:  anxious  Mood:  anxious and depressed  Thought process:  some tangentiality  Thought content:    some tangentiality  Sensory/Perceptual disturbances:    WNL  Orientation:  oriented to person, place, time/date, situation, day of week, month of year and year  Attention:  Good  Concentration:  Good and Fair  Memory:  WNL  Fund of knowledge:   Good  Insight:    Good and Fair  Judgment:   Good  Impulse Control:  Good   Risk Assessment: Danger to Self:  No Self-injurious Behavior: No Danger to Others: No Duty to Warn:no Physical Aggression / Violence:No  Access to Firearms a concern: No  Gang Involvement:No   Subjective:  Patient today reporting anxiety and depression and difficulty not focusing on negative thoughts.  (See progress note below).   Interventions: Solution-Oriented/Positive Psychology and Ego-Supportive  Diagnosis:   ICD-10-CM   1. Generalized anxiety disorder  F41.1       Plan of Care: Patient not signing treatment plan on computer screen due to COVID.  Treatment Goals: Treatment goals will remain on treatment plan as patient works with strategies to achieve her goals. Progress will be noted each session and documented in the "progress" section of note.  Long term goal: Develop the ability to recognize, accept, and cope with feelings of depression.  Short term goal: Verbalize any unresolved grief issues that may be contributing to depression.  Strategy: Replace negative self-defeating self talk with  verbalization of realistic and positive cognitive messages to lessen depression and improve mood.   Progress: Patient in today struggling with a lot of anxious/depressive thoughts.  Denies any SI. Reporting depression, anxiety, tearfulness, and difficulty hanging onto positive thoughts. Intermittent tearfulness during session but became more diminished as the session went on.  Reporting a lot of difficulty in reverting back to depressive, anxious thoughts and ends up more tearful and more sad and depressed, but states her sadness and depression are not focused on specific things but more of a feeling.  There are some things such as her incarcerated son that creates more sadness but she acknowledges that she tends to focus more on the negatives and positives and that this has been a habit for a while.  We worked on this in session today about being able to interrupt anxious feelings and replace them with more realistic and empowering thoughts.  Patient can grasp this but it is hard for her to hold onto it.  Did put forth some good effort today in session and is to continue working on this between sessions.  Is also to be in touch with some of her neighbors and try having more interactions with them.  Was able to stay on task a little more today in session which seemed to help her, although it is a challenge for her to hold onto positives.  Used the CBT posterior again in my office to work on the relationship between thoughts and feelings with patient, emphasizing the work done earlier in regards to anxious thoughts leading  to anxious feelings.  Encouraged patient to stay in the present focusing on what she can control or change, except the things that she cannot change, be outside some each day as she is able, recognize and hold onto positive thought patterns, intentionally look for positives daily, work on the interruption and replacement of anxious/fearful thoughts changing them to more reality-based  thoughts, and follow-through between sessions working with strategies discussed to better manage anxiety and depression.  Patient did hold on to a few positives for at least part of the session today versus always interrupting with negative thoughts, so this was some progress for her.  She also seemed to notice a difference. I  Goal review and progress/challenges noted with patient.  Next appointment within 2 weeks.  Shanon Ace, LCSW

## 2020-10-27 DIAGNOSIS — M5136 Other intervertebral disc degeneration, lumbar region: Secondary | ICD-10-CM | POA: Diagnosis not present

## 2020-10-27 NOTE — Progress Notes (Signed)
[facility] RN NOTE  PATIENT NAME: Tracy Malone DOB: 1932-02-12 MRN: [id number]  PRIMARY CARE PROVIDER: [name], MD  RESPONSIBLE PARTY: [name] (husband) Acct ID - Guarantor Home Phone Work Phone Relationship Acct Type  [id number] MATALIE, [phone] Self P/F     [address] [place], [place] [address]   Covid-19 Pre-screening Negative  PLAN OF CARE and INTERVENTION:  1. ADVANCE CARE PLANNING/GOALS OF CARE: Goal is for patient to remain at home with her husband and avoid hospitalizations. She is a Full code. 2. PATIENT/CAREGIVER EDUCATION: Symptom management, safe mobility, s/s of infection 3. DISEASE STATUS: Met with patient and her husband in their home. She says that for the past 85 weeks she has been feeling very jittery and weak. Both hands are shaky and her stomach also feels jittery. She is more tearful today. She took her blood sugar because of how she has been feeling and it was 118 fasting Mon, Tues, Wed. After she ate it went up to 159. Her daughter gave her this meter some time ago but she just ran out of test strips so was unable to take it today. She says her blood sugars usually run between 99-101 when she takes it fasting in the morning. She is urinating more than usual. She is interested in getting her A1C checked. She has made an appointment during this visit with her PCP for tomorrow 10/07/20 at 1:30p. She had stopped taking her Prozac again and she says liquid was recommended. She took one dose of the liquid Prozac yesterday, but does not want to take it today or possibly any more. She states that the liquid made her lips feel "puckered" and her tongue became slightly swollen and she took some Benadryl which was effective. She is cancelling her PT session for this evening at 2p because she says she doesn't feel well. She continues to report pain in her L hip region which radiates down her leg. Also states she has pain in her left  abdomen with occasional sharp pains she feels also stems from her back issues. She also appears more anxious today. She is concerned about her husband and his health issues which causes her increased anxiety. She continues on Xanax to help. Will continue to monitor.    HISTORY OF PRESENT ILLNESS: This is a 87 yo female with a diagnosis of Parkinson'sdisease.She has a h/o GERD, IBS, ischemic colitis, hypothyroidism, essential tremors and osteoporosis.Palliative care team continues to follow patient and will visit monthly and PRN.   CODE STATUS: Full code  ADVANCED DIRECTIVES: Y MOST FORM: no PPS: 50%   PHYSICAL EXAM:   VITALS: Today's Vitals   10/06/20 1121  BP: 121/70  Pulse: 72  Resp: 18  Temp: 97.7 F (36.5 C)  TempSrc: Temporal  SpO2: 97%  PainSc: 4   PainLoc: Hip    LUNGS: clear to auscultation  CARDIAC: Cor RRR EXTREMITIES: No edema SKIN: Exposed skin is dry and intact; no skin breakdown reported  NEURO: Alert and oriented x 4, tearful at times, generalized weakness, right hand tremor, ambulatory   (Duration of visit and documentation 45 minutes)   [name], RN BSN

## 2020-10-29 ENCOUNTER — Other Ambulatory Visit: Payer: Medicare Other | Admitting: *Deleted

## 2020-10-29 ENCOUNTER — Other Ambulatory Visit: Payer: Self-pay

## 2020-10-29 DIAGNOSIS — Z515 Encounter for palliative care: Secondary | ICD-10-CM

## 2020-11-02 ENCOUNTER — Encounter: Payer: Self-pay | Admitting: Adult Health

## 2020-11-02 ENCOUNTER — Other Ambulatory Visit: Payer: Self-pay

## 2020-11-02 ENCOUNTER — Ambulatory Visit (INDEPENDENT_AMBULATORY_CARE_PROVIDER_SITE_OTHER): Payer: Medicare Other | Admitting: Adult Health

## 2020-11-02 DIAGNOSIS — G4700 Insomnia, unspecified: Secondary | ICD-10-CM

## 2020-11-02 DIAGNOSIS — F411 Generalized anxiety disorder: Secondary | ICD-10-CM

## 2020-11-02 DIAGNOSIS — F331 Major depressive disorder, recurrent, moderate: Secondary | ICD-10-CM | POA: Diagnosis not present

## 2020-11-02 NOTE — Progress Notes (Signed)
Tracy Malone [id number] 1931/08/12 87 y.o.  Subjective:   Patient ID:  Tracy Malone is a 87 y.o. (DOB [birth year]) female.  Chief Complaint: No chief complaint on file.   HPI Tracy Malone presents to the office today for follow-up of  MDD, GAD, and insomnia.  Accompanied by husband.    Describes mood today as "about the same". Pleasant. Tearful at times. Mood symptoms - reports depression, anxiety, and irritability. Started Celexa 5mg  daily - feels like it is making her jittery and would like to stop it. Would like to restart the Prozac and try taking it at night. Stating "it works the best for me". Stopped initially but feels it worked the best for mood symptoms.  Having crying spells - usually in the mornings. Diagnosed with Parkinson's in 2017. Started on Carba-Dopa Levodopa on 10/21 - "it makes me jittery". Varying interest and motivation. Taking medications as prescribed. Seeing therapist. Energy levels lower. Stating "I piddle around". Using a rolling walker. Active, does not have a regular exercise routine. Enjoys some usual interests and activities. Married. Lives with husband of 54 years. 2 children 59 and 55. Son lives in [place]. Spending time with family. Appetite adequate. Weight stable. Sleeping difficulties. Averages 3 to 4 hours - back pain and going to the bathroom. Focus and concentration stable. Completing tasks. Managing aspects of household. Retired.  Denies SI or HI.  Denies AH or VH.  Previous medication trials: Multiple medication trials     PHQ2-9   [facility] Visit from 09/08/2020 in [facility] Visit from 09/03/2019 in [facility] Patient Outreach Telephone from 12/02/2018 in [facility] Visit from 08/29/2018 in [facility] Visit from 08/27/2017 in [facility]  PHQ-2 Total Score 6 6 1 4 4   PHQ-9 Total Score 7 17 -- -- --       Review of Systems:  Review of  Systems  Musculoskeletal: Negative for gait problem.  Neurological: Negative for tremors.  Psychiatric/Behavioral:       Please refer to HPI    Medications: I have reviewed the patient's current medications.  Current Outpatient Medications  Medication Sig Dispense Refill  . acetaminophen (TYLENOL) 650 MG CR tablet Take 650 mg by mouth every 8 (eight) hours as needed for pain.  (Patient not taking: No sig reported)    . ALPRAZolam (XANAX) 0.5 MG tablet Take four tablets daily as needed for anxiety. 120 tablet 2  . b complex vitamins tablet Take 1 tablet by mouth daily. (Patient not taking: Reported on 10/07/2020)    . calcium carbonate (OSCAL) 1500 (600 Ca) MG TABS tablet Take 600 mg by mouth 2 (two) times daily. (Patient not taking: Reported on 10/07/2020)    . Carbidopa-Levodopa ER (SINEMET CR) 25-100 MG tablet controlled release Take 1 tablet by mouth in the morning, at noon, in the evening, and at bedtime. 120 tablet 11  . Cholecalciferol (VITAMIN D) 2000 UNITS tablet Take 2,000 Units by mouth daily. (Patient not taking: Reported on 10/07/2020)    . citalopram (CELEXA) 10 MG tablet Take 1/2 tablet daily. 7 tablet 0  . dicyclomine (BENTYL) 10 MG capsule As needed (Patient not taking: No sig reported)    . FLUoxetine (PROZAC) 20 MG/5ML solution Take 2.5 mLs (10 mg total) by mouth daily. (Patient not taking: Reported on 10/07/2020) 120 mL 3  . Probiotic Product (ALIGN PO) Take 1 capsule by mouth daily. (Patient not taking: No sig reported)    .  propranolol (INDERAL) 10 MG tablet Take 1 tablet (10 mg total) by mouth 3 (three) times daily. (Patient not taking: Reported on 10/07/2020) 90 tablet 5  . SYNTHROID 25 MCG tablet TAKE 1 TABLET DAILY BEFORE BREAKFAST FOR HYPOTHYROIDISM 90 tablet 0   Current Facility-Administered Medications  Medication Dose Route Frequency Provider Last Rate Last Admin  . 0.9 %  sodium chloride infusion  500 mL Intravenous Once Milus Banister, MD        Medication  Side Effects: None  Allergies:  Allergies  Allergen Reactions  . Iodine Anaphylaxis    IV and topical forms. Other reaction(s): Unknown  . Levsin [Hyoscyamine Sulfate]     Vision problems/pt has glaucoma  . Salmon [Fish Allergy] Hives and Shortness Of Breath  . Shellfish Allergy Anaphylaxis  . Tramadol Swelling  . Remeron [Mirtazapine] Other (See Comments)    Cause blurred vision and red eyes, pt has glaucoma  . Aspirin Other (See Comments)    Sever stomach pain due to ulcer scaring.  . Bis Subcit-Metronid-Tetracyc Swelling    Tongue swelling. Face tingling Other reaction(s): Unknown  . Cephalexin Hives    Other reaction(s): hives  . Ciprofloxacin Diarrhea  . Codeine Nausea And Vomiting  . Contrast Media [Iodinated Diagnostic Agents]   . Cyclobenzaprine Other (See Comments)    Tingly/prickly sensation. Other reaction(s): tingly/prickly sensation  . Darvocet [Propoxyphene N-Acetaminophen] Nausea And Vomiting  . Demerol [Meperidine] Nausea Only  . Dexlansoprazole Swelling    Redness, swelling and peeling of both feet. Other reaction(s): foot pain  . Diphedryl [Diphenhydramine] Other (See Comments)    Increased pulse/small amount ok  . Doxycycline Hyclate Other (See Comments)    GI intolerance.  . Doxycycline Hyclate     Other reaction(s): GI intolerance  . Epinephrine Other (See Comments)    Breathing problems Other reaction(s): breathing problems/fainting  . Erythromycin Other (See Comments)    GI intolerance. Other reaction(s): GI  . Fish Oil     Other reaction(s): breathing problems/hives  . Hyoscyamine     Other reaction(s): eye pain  . Latex Other (See Comments)    Gloves ok.  Skin gets red from elastic in underwear and latex bandaides.  . Meperidine Hcl     Other reaction(s): vomiting  . Nitrofurantoin Diarrhea  . Other     Other reaction(s): migraines Other reaction(s): Unknown Other reaction(s): Unknown Other reaction(s): Unknown Other reaction(s):  increased pulse, faint, diarrhea  . Prednisone Other (See Comments)    Headache Other reaction(s): headache  . Ra Diphedryl Allergy [Diphenhydramine Hcl]     Other reaction(s): increased pulse small dose okay  . Sertraline Hcl Swelling and Other (See Comments)    Migraine Swelling of tongue/lip (09/2012) Other reaction(s): Unknown  . Shellfish-Derived Products     Other reaction(s): Unknown  . Sulfa Antibiotics Other (See Comments)    Increased pulse, fainting, diarrhea, thrush  . Wellbutrin [Bupropion] Other (See Comments)    Headaches  . Xylocaine [Lidocaine Hcl]     With epinephrine, given by dentist.  Speeded up heart rate and she passed out (occured twice, at dentist)  . Xylocaine [Lidocaine]     Other reaction(s): Unknown  . Biaxin [Clarithromycin] Rash    Started after completing 10 day course of 2000 mg /day, Lips swelling  . Ibuprofen Other (See Comments)    Motrin ok with a GI effect. Other reaction(s): rash Motrin okay with a GI effect    Past Medical History:  Diagnosis Date  . Bell's  palsy 1966   Hx: right side facial droop, resolved per patient 04/02/19  . Carotid artery disease ([facility]) 2010   on vascular screening;unchanged 2013.(could not tolerate simvastatin, no other statins tried)--<30% blockage bilat 07/2011  . Chronic abdominal pain   . Chronic fatigue and malaise   . Claustrophobia   . Depression    treated in the past for years;stopped in 2010 for a years  . Duodenal ulcer 1962   h/o  . Dysrhythmia    ocassional PVC's, no current problems per patient on 04/02/19  . Fibromyalgia   . Frequent PVCs 07/2012   Seen by [facility]: benign, asymptomatic, normal EF  . GERD (gastroesophageal reflux disease)    diet controlled  . Glaucoma, narrow-angle    s/p laser surgery  . History of hiatal hernia    during endoscopy  . Hypothyroid 9/08  . IBS (irritable bowel syndrome)    Dr. Benson [place]  . Ischemic colitis ([facility]) 11/21/2018   no current problems per patient  on 04/02/19  . Ocular migraine   . Osteoporosis 10/11   Dr.Hawkes  . Panic attack   . Parkinson disease ([facility])   . Parkinson's disease ([facility]) 06/23/2016  . Recurrent UTI    has cystocele-Dr.Grewal  . Shingles 1999   h/o  . Superficial thrombophlebitis 03/2009   RLE  . Trochanteric bursitis 12/2008   bilateral    Family History  Problem Relation Age of Onset  . Heart disease Mother   . Hypertension Mother   . Hypertension Sister   . HIV Son   . Heart disease Brother   . Lung cancer Brother        lung  . Diabetes Maternal Grandfather   . Diabetes Granddaughter        type 1  . Colon cancer Neg Hx   . Esophageal cancer Neg Hx   . Prostate cancer Neg Hx   . Stomach cancer Neg Hx   . Rectal cancer Neg Hx     Social History   Socioeconomic History  . Marital status: Married    Spouse name: Gwyndolyn Saxon  . Number of children: 2  . Years of education: Not on file  . Highest education level: Not on file  Occupational History  . Occupation: retired (school system)  Tobacco Use  . Smoking status: Never Smoker  . Smokeless tobacco: Never Used  Vaping Use  . Vaping Use: Never used  Substance and Sexual Activity  . Alcohol use: No    Alcohol/week: 0.0 standard drinks  . Drug use: No  . Sexual activity: Not Currently    Partners: Male    Birth control/protection: Other-see comments    Comment: Hysterectomy  Other Topics Concern  . Not on file  Social History Narrative   Married.  Son lives in [place]; Daughter [name] lives in [place]; 2 grandchildren   Social Determinants of Health   Financial Resource Strain: Not on file  Food Insecurity: Not on file  Transportation Needs: Not on file  Physical Activity: Not on file  Stress: Not on file  Social Connections: Not on file  Intimate Partner Violence: Not on file    Past Medical History, Surgical history, Social history, and Family history were reviewed and updated as appropriate.   Please see review of systems for  further details on the patient's review from today.   Objective:   Physical Exam:  LMP  (LMP Unknown)   Physical Exam Constitutional:      General: She is  not in acute distress. Musculoskeletal:        General: No deformity.  Neurological:     Mental Status: She is alert and oriented to person, place, and time.     Coordination: Coordination normal.  Psychiatric:        Attention and Perception: Attention and perception normal. She does not perceive auditory or visual hallucinations.        Mood and Affect: Mood normal. Mood is not anxious or depressed. Affect is not labile, blunt, angry or inappropriate.        Speech: Speech normal.        Behavior: Behavior normal.        Thought Content: Thought content normal. Thought content is not paranoid or delusional. Thought content does not include homicidal or suicidal ideation. Thought content does not include homicidal or suicidal plan.        Cognition and Memory: Cognition and memory normal.        Judgment: Judgment normal.     Comments: Insight intact     Lab Review:     Component Value Date/Time   NA 142 03/16/2020 1153   NA 141 09/03/2019 1117   K 4.2 03/16/2020 1153   CL 106 03/16/2020 1153   CO2 27 03/16/2020 1153   GLUCOSE 97 03/16/2020 1153   BUN 10 03/16/2020 1153   BUN 14 09/03/2019 1117   CREATININE 0.62 03/16/2020 1153   CREATININE 0.75 10/22/2018 1401   CREATININE 0.67 11/23/2016 0815   CALCIUM 9.6 03/16/2020 1153   PROT 6.8 09/03/2019 1117   ALBUMIN 4.2 09/03/2019 1117   AST 19 09/03/2019 1117   AST 17 10/22/2018 1401   ALT 4 09/03/2019 1117   ALT 7 10/22/2018 1401   ALKPHOS 83 09/03/2019 1117   BILITOT 1.0 09/03/2019 1117   BILITOT 0.8 10/22/2018 1401   GFRNONAA >60 03/16/2020 1153   GFRNONAA >60 10/22/2018 1401   GFRAA >60 03/16/2020 1153   GFRAA >60 10/22/2018 1401       Component Value Date/Time   WBC 8.7 03/16/2020 1153   RBC 4.85 03/16/2020 1153   HGB 14.3 03/16/2020 1153   HGB 14.2  01/22/2020 1213   HCT 45.5 03/16/2020 1153   HCT 42.5 01/22/2020 1213   PLT 316 03/16/2020 1153   PLT 268 01/22/2020 1213   MCV 93.8 03/16/2020 1153   MCV 91 01/22/2020 1213   MCH 29.5 03/16/2020 1153   MCHC 31.4 03/16/2020 1153   RDW 13.1 03/16/2020 1153   RDW 12.5 01/22/2020 1213   LYMPHSABS 1.8 01/22/2020 1213   MONOABS 0.9 04/08/2019 0516   EOSABS 0.2 01/22/2020 1213   BASOSABS 0.1 01/22/2020 1213    No results found for: POCLITH, LITHIUM   No results found for: PHENYTOIN, PHENOBARB, VALPROATE, CBMZ   .res Assessment: Plan:    Plan:  PDMP reviewed  1. Xanax 0.5mg  - 4 x daily prn anxiety    2  Add Prozac 10mg  daily  3. D/C Celexa 5mg  daily  RTC 4 weeks  Patient advised to contact office with any questions, adverse effects, or acute worsening in signs and symptoms.  Discussed potential benefits, risk, and side effects of benzodiazepines to include potential risk of tolerance and dependence, as well as possible drowsiness.  Advised patient not to drive if experiencing drowsiness and to take lowest possible effective dose to minimize risk of dependence and tolerance.   Diagnoses and all orders for this visit:  Generalized anxiety disorder  Major depressive disorder,  recurrent episode, moderate (HCC)  Insomnia, unspecified type     Please see After Visit Summary for patient specific instructions.  Future Appointments  Date Time Provider [place]  11/09/2020  3:00 PM Shanon Ace, LCSW [facility] None  11/23/2020  3:00 PM Shanon Ace, LCSW [facility] None  12/07/2020  1:00 PM Shanon Ace, LCSW [facility] None  12/22/2020  1:00 PM Shanon Ace, LCSW [facility] None  02/16/2021  3:00 PM Debbora Presto, NP [facility] None  03/10/2021  3:00 PM [name], MD P[facility]    No orders of the defined types were placed in this encounter.   -------------------------------

## 2020-11-03 DIAGNOSIS — M545 Low back pain, unspecified: Secondary | ICD-10-CM | POA: Diagnosis not present

## 2020-11-09 ENCOUNTER — Ambulatory Visit (INDEPENDENT_AMBULATORY_CARE_PROVIDER_SITE_OTHER): Payer: Medicare Other | Admitting: Psychiatry

## 2020-11-09 ENCOUNTER — Other Ambulatory Visit: Payer: Self-pay

## 2020-11-09 DIAGNOSIS — F411 Generalized anxiety disorder: Secondary | ICD-10-CM

## 2020-11-09 NOTE — Progress Notes (Signed)
[facility] Counselor/Therapist Progress Note  Patient ID: Tracy Malone, MRN: [id number],    Date: 11/09/2020  Time Spent: 60 minutes    3:00pm to 4:00pm  Treatment Type: Individual Therapy  Reported Symptoms: Anxiety, depression  Mental Status Exam:  Appearance:   Neat     Behavior:  Appropriate and Sharing  Motor:  some slowed but walking today without her rollater  Speech/Language:   Clear and Coherent  Affect:  Depressed and anxiety  Mood:  anxious and depressed  Thought process:  normal  Thought content:    some rumination  Sensory/Perceptual disturbances:    WNL  Orientation:  oriented to person, place, time/date, situation, day of week, month of year and year  Attention:  Fair  Concentration:  Fair  Memory:  WNL  Fund of knowledge:   Good  Insight:    Good and Fair  Judgment:   Good and Fair  Impulse Control:  Good   Risk Assessment: Danger to Self:  No Self-injurious Behavior: No Danger to Others: No Duty to Warn:no Physical Aggression / Violence:No  Access to Firearms a concern: No  Gang Involvement:No   Subjective: Patient today reports daily anxiety, depression mostly relating to her son being incarcerated, and her and husband's health concerns (neuro,and breathing issues).  See progress note below.   Interventions: Solution-Oriented/Positive Psychology and Ego-Supportive  Diagnosis:   ICD-10-CM   1. Generalized anxiety disorder  F41.1       Plan of Care: Patient not signing treatment plan on computer screen due to COVID.  Treatment Goals: Treatment goals will remain on treatment plan as patient works with strategies to achieve her goals. Progress will be noted each session and documented in the "progress" section of note.  Long term goal: Develop the ability to recognize, accept, and cope with feelings of depression.  Short term goal: Verbalize any unresolved grief issues that may be contributing to  depression.  Strategy: Replace negative self-defeating self talk with verbalization of realistic and positive cognitive messages to lessen depression and improve mood.   Progress: Patient in today struggling with anxiety and depression related to her incarcerated son, her own health conditions including Parkinson's, and her husband's health conditions with some breathing issues and balance issues more recently.  Vented and discussed a lot of her concerns about her son, and also her own physical health and her husband's health problems.  Patient has also begun having some balance issues with her Parkinson's.  She needed session today to process her thoughts and feelings about the above-stated issues involving self, adult son and husband.  Overall patient was doing some better emotionally today except got stuck in looking at only "what could go wrong" in regards to any of her concerns and especially about her Parkinson's disease.  She shared that she gets this information from what she reads online and all of it seemed to be focused in a negative direction.  Encouraged patient to tune in more with her doctor that is following her and that that be more of her source of information than online resources.  Patient was not as tearful today and was able to refocus at times and also see some positives in her situation especially as far as a good support she has.  Encouraged patient to stay in the present more and focus on what she can control, accept the things that she cannot change, try getting outside some each day as she is able, recognize and hold onto  positive thoughts, intentionally look for positives daily, interrupt and replace anxious/fearful thoughts changing them to be more reality-based thoughts as we have actually practiced in prior sessions.   Goal review and progress/challenges noted with patient.  Next appointment within 3 weeks.   Shanon Ace, LCSW

## 2020-11-15 NOTE — Progress Notes (Signed)
[facility] RN NOTE  PATIENT NAME: Tracy Malone DOB: August 23, 1931 MRN: [id number]  PRIMARY CARE PROVIDER: [name], MD  RESPONSIBLE PARTY: [name] (husband) Acct ID - Guarantor Home Phone Work Phone Relationship Acct Type  [id number] DENISS, [phone] Self P/F     [address], [place], [place] [address]   Covid-19 Pre-screening Negative  PLAN OF CARE and INTERVENTION:  1. ADVANCE CARE PLANNING/GOALS OF CARE: Patient's goal is to experience less crying spells. She is a Full code.  2. PATIENT/CAREGIVER EDUCATION: Symptom management, pain management, safe mobility, s/s of infection 3. DISEASE STATUS: Face-to-face visit made to patient and her husband, [name], who is also a palliative care patient. She continues to c/o pain in her back and left hip that radiates down her left leg. She says that her left hip is often swollen. She is also having pain in her ribs from a rib fracture a few months ago s/p a mechanical fall. She has ordered a rib brace from [employer] and her Orthopedic doctor has also ordered her a brace from a local medical supply company as patient requested. She is being seen regularly by Ortho. She is to restart physical therapy next week. She remains ambulatory without the use of assistive devices, but her gait is unsteady at times. She does utilize her walker at times when getting up in the middle of the night for safety. She says this am she felt weaker than usual, but slept well during the night. She also states that it is not until after she takes her Carbiodopa/Levodopa that she starts "feeling funny." She knows that she must continue to take this d/t her Parkinson's disease. She continues with a right hand tremor and difficulty writing. She reports that she is now starting to have some slight shortness of breath with exertion. She is able to recover quickly at rest. She feels tired all of the time. She is also experiencing an increase in  her crying spells lately. She was recently started on Celexa to help with her mood, since she reports Prozac aggravated her stomach so she is unable to take this. She is not eating as much and her last weight was 99.5 lbs. Will continue to monitor.   HISTORY OF PRESENT ILLNESS: This is a 87 yo female with a diagnosis of Parkinson'sdisease.She has a h/o GERD, IBS, ischemic colitis, hypothyroidism, essential tremors and osteoporosis.Palliative care team continues to follow patient and will visit monthly and PRN.    CODE STATUS: Full code ADVANCED DIRECTIVES: Y MOST FORM: no PPS: 50%   PHYSICAL EXAM:   LUNGS: clear to auscultation  CARDIAC: Cor RRR EXTREMITIES: No edema SKIN: Exposed skin is dry and intact  NEURO: Alert and oriented x 4, tearful at times, generalized weakness, ambulatory   (Duration of visit and documentation 60 minutes)   [name], RN BSN

## 2020-11-18 ENCOUNTER — Telehealth: Payer: Self-pay | Admitting: Neurology

## 2020-11-18 MED ORDER — CARBIDOPA-LEVODOPA ER 25-100 MG PO TBCR: 1.0000 | EXTENDED_RELEASE_TABLET | Freq: Four times a day (QID) | ORAL | 3 refills | Status: DC

## 2020-11-18 NOTE — Addendum Note (Signed)
Addended by: Wyvonnia Lora on: 11/18/2020 02:06 PM   Modules accepted: Orders

## 2020-11-18 NOTE — Telephone Encounter (Signed)
@   10:07 pt left vm asking for a call from Terrence Dupont, RN to discuss the amount of Carbidopa-Levodopa ER (SINEMET CR) 25-100 MG tablet controlled release she was given on her last refill versus the 300 amount she was previously given.  Please call

## 2020-11-18 NOTE — Telephone Encounter (Signed)
Called and spoke w/ pt. She states most recent bottle given for carbidopa-levodopa does not have "generic Sinemet" listed on bottle. Wants to make sure she got same thing as before. I instructed her to follow up with her pharmacy on this. She would also like rx resent to be a 90 day supply. I e-scribed this.

## 2020-11-23 ENCOUNTER — Other Ambulatory Visit: Payer: Self-pay

## 2020-11-23 ENCOUNTER — Ambulatory Visit (INDEPENDENT_AMBULATORY_CARE_PROVIDER_SITE_OTHER): Payer: Medicare Other | Admitting: Psychiatry

## 2020-11-23 DIAGNOSIS — F411 Generalized anxiety disorder: Secondary | ICD-10-CM

## 2020-11-23 NOTE — Progress Notes (Signed)
[facility] Counselor/Therapist Progress Note  Patient ID: Tracy Malone, MRN: [id number],    Date: 11/23/2020   Time Spent: 60 minutes   3:00pm to 4:00pm  Treatment Type: Individual Therapy  Reported Symptoms: anxiety, depression  Mental Status Exam:  Appearance:   Neat     Behavior:  Appropriate and Sharing  Motor:  walks with cane or rollater  Speech/Language:   Clear and Coherent  Affect:  anxious, depressed  Mood:  anxious  Thought process:  normal  Thought content:    some ruminating  Sensory/Perceptual disturbances:    WNL  Orientation:  oriented to person, place, time/date, situation, day of week, month of year and year  Attention:  Good  Concentration:  Fair  Memory:  "not too many memory issues"  Fund of knowledge:   Good  Insight:    Good  Judgment:   Good  Impulse Control:  Good   Risk Assessment: Danger to Self:  No Self-injurious Behavior: No Danger to Others: No Duty to Warn:no Physical Aggression / Violence:No  Access to Firearms a concern: No  Gang Involvement:No   Subjective: Patient today reporting anxiety and depression. Very hard not to focus on the negatives vs positives.  See Progress Note below.   Interventions: Solution-Oriented/Positive Psychology and Ego-Supportive  Diagnosis:   ICD-10-CM   1. Generalized anxiety disorder  F41.1     Plan of Care: Patient not signing treatment plan on computer screen due to COVID.  Treatment Goals: Treatment goals will remain on treatment plan as patient works with strategies to achieve her goals. Progress will be noted each session and documented in the "progress" section of note.  Long term goal: Develop the ability to recognize, accept, and cope with feelings of depression.  Short term goal: Verbalize any unresolved grief issues that may be contributing to depression.  Strategy: Replace negative self-defeating self talk with verbalization of realistic and positive cognitive  messages to lessen depression and improve mood.   Progress: Patient in today reporting anxiety, depression, and occasional sleep issues.  Denies any SI.  Difficulty with physical pains in back and legs and is under care of her doctor and chiropractor. Gets more tired easily "as I get older". Tends to focus on negatives and very hard for her to focus on positives versus negatives. Tearful intermittently today about some family issues that she cannot change, about seeing world violence in [place] on TV, and various other situations that are out of her control.  Worked with patient more today on realizing how staying connected with these various stressors affects her emotionally and in each case, she has no control in any of it.  Looked at what she might do instead of focusing on those things, including trying to be more involved outside of her home as she is able with close neighbors, inviting some of her neighbors to stop in and see her as they are in a community with other senior citizens nearby.  Husband and other family members have encouraged patient to not hang onto negatives or things that upset her, but have not had much success.  Today towards the end of session she did seem to grasp what the benefit might be for her and not always focusing on the negatives.  She was encouraged to do some homework as far as focusing on positives and noticing differences in her outlook.  She has gotten word that her son who is incarcerated is due to be released on November  11 of this year so that is a positive for her now that she has a definite date.  She does seem to at times have some progression in her Parkinson's but still does relatively well moving about for her age and health issues.  Seems more grounded by session and and after having talked through some of her worries and concerns as noted above.  Encouraged patient to follow through with recommendations above(per short-term goal and strategy and treatment plan  above) and also to interrupt anxious/negative thoughts as they arise and replace them with more positive and affirming thoughts, trying to stay focused on what she can change versus cannot, and stay in the present versus the past or the future.  Goal review and progress/challenges noted with patient.  Next appt within 3 weeks.   Shanon Ace, LCSW

## 2020-11-24 DIAGNOSIS — M5416 Radiculopathy, lumbar region: Secondary | ICD-10-CM | POA: Diagnosis not present

## 2020-11-26 DIAGNOSIS — M5416 Radiculopathy, lumbar region: Secondary | ICD-10-CM | POA: Diagnosis not present

## 2020-11-29 ENCOUNTER — Telehealth: Payer: Self-pay | Admitting: *Deleted

## 2020-11-29 NOTE — Telephone Encounter (Signed)
Called and spoke with patient to schedule a Palliative care home visit. Visit scheduled for 5/13@10a .

## 2020-11-30 ENCOUNTER — Ambulatory Visit: Payer: Medicare Other | Admitting: Adult Health

## 2020-11-30 DIAGNOSIS — D485 Neoplasm of uncertain behavior of skin: Secondary | ICD-10-CM | POA: Diagnosis not present

## 2020-11-30 DIAGNOSIS — L820 Inflamed seborrheic keratosis: Secondary | ICD-10-CM | POA: Diagnosis not present

## 2020-11-30 DIAGNOSIS — L570 Actinic keratosis: Secondary | ICD-10-CM | POA: Diagnosis not present

## 2020-11-30 DIAGNOSIS — Z85828 Personal history of other malignant neoplasm of skin: Secondary | ICD-10-CM | POA: Diagnosis not present

## 2020-12-01 DIAGNOSIS — J3489 Other specified disorders of nose and nasal sinuses: Secondary | ICD-10-CM | POA: Diagnosis not present

## 2020-12-01 DIAGNOSIS — M5136 Other intervertebral disc degeneration, lumbar region: Secondary | ICD-10-CM | POA: Diagnosis not present

## 2020-12-01 DIAGNOSIS — M26609 Unspecified temporomandibular joint disorder, unspecified side: Secondary | ICD-10-CM | POA: Diagnosis not present

## 2020-12-02 ENCOUNTER — Other Ambulatory Visit: Payer: Self-pay

## 2020-12-02 ENCOUNTER — Encounter: Payer: Self-pay | Admitting: Adult Health

## 2020-12-02 ENCOUNTER — Ambulatory Visit (INDEPENDENT_AMBULATORY_CARE_PROVIDER_SITE_OTHER): Payer: Medicare Other | Admitting: Adult Health

## 2020-12-02 DIAGNOSIS — F411 Generalized anxiety disorder: Secondary | ICD-10-CM

## 2020-12-02 DIAGNOSIS — G4700 Insomnia, unspecified: Secondary | ICD-10-CM | POA: Diagnosis not present

## 2020-12-02 DIAGNOSIS — F331 Major depressive disorder, recurrent, moderate: Secondary | ICD-10-CM | POA: Diagnosis not present

## 2020-12-02 DIAGNOSIS — F410 Panic disorder [episodic paroxysmal anxiety] without agoraphobia: Secondary | ICD-10-CM | POA: Diagnosis not present

## 2020-12-02 NOTE — Progress Notes (Signed)
Tracy Malone JA:[id number] 05/26/1932 87 y.o.  Subjective:   Patient ID:  Tracy Malone is a 87 y.o. (DOB [birth year]) female.  Chief Complaint: No chief complaint on file.   HPI   Tracy Malone presents to the office today for follow-up of  MDD, GAD, and insomnia.  Accompanied by husband.  Describes mood today as "better". Pleasant. Tearful at times - "crying spells". Mood symptoms - reports decreased depression, anxiety, and irritability. Reports panic attacks 1 this week, 2 last week. Has restarted Prozac at 10mg  and is tolerating - would like to increase dose to 20mg . Reports some visual changes - "not sure if it's Prozac related. Trying to manage multiple medical issues. Gets frustrated with having appointments - stating "I would like a week off from having to go to appointments" Diagnosed with Parkinson's in 2017 - taking Carba-Dopa Levodopa. Varying interest and motivation. Taking medications as prescribed. Seeing therapist - Rinaldo Cloud. Energy levels lower. Using a rolling walker. Active, does not have a regular exercise routine. Enjoys some usual interests and activities. Married. Lives with husband of 31 years. 2 children 59 and 55. Son lives in [place]. Spending time with family. Appetite adequate. Weight stable. Sleeping difficulties. Averages 3 to 4 hours - back pain and going to the bathroom. Focus and concentration stable. Completing tasks. Managing aspects of household. Retired.  Denies SI or HI.  Denies AH or VH.  Previous medication trials: Multiple medication trials    PHQ2-9   [place] Office Visit from 09/08/2020 in [facility] Visit from 09/03/2019 in [facility] Telephone from 12/02/2018 in [facility] Visit from 08/29/2018 in [facility] Visit from 08/27/2017 in [facility]  PHQ-2 Total Score 6 6 1 4 4   PHQ-9 Total Score 7 17 -- -- --       Review of  Systems:  Review of Systems  Musculoskeletal: Negative for gait problem.  Neurological: Negative for tremors.  Psychiatric/Behavioral:       Please refer to HPI    Medications: I have reviewed the patient's current medications.  Current Outpatient Medications  Medication Sig Dispense Refill  . acetaminophen (TYLENOL) 650 MG CR tablet Take 650 mg by mouth every 8 (eight) hours as needed for pain.  (Patient not taking: No sig reported)    . ALPRAZolam (XANAX) 0.5 MG tablet Take four tablets daily as needed for anxiety. 120 tablet 2  . b complex vitamins tablet Take 1 tablet by mouth daily. (Patient not taking: Reported on 10/07/2020)    . calcium carbonate (OSCAL) 1500 (600 Ca) MG TABS tablet Take 600 mg by mouth 2 (two) times daily. (Patient not taking: Reported on 10/07/2020)    . Carbidopa-Levodopa ER (SINEMET CR) 25-100 MG tablet controlled release Take 1 tablet by mouth in the morning, at noon, in the evening, and at bedtime. 360 tablet 3  . Cholecalciferol (VITAMIN D) 2000 UNITS tablet Take 2,000 Units by mouth daily. (Patient not taking: Reported on 10/07/2020)    . citalopram (CELEXA) 10 MG tablet Take 1/2 tablet daily. 7 tablet 0  . dicyclomine (BENTYL) 10 MG capsule As needed (Patient not taking: No sig reported)    . FLUoxetine (PROZAC) 20 MG/5ML solution Take 2.5 mLs (10 mg total) by mouth daily. (Patient not taking: Reported on 10/07/2020) 120 mL 3  . Probiotic Product (ALIGN PO) Take 1 capsule by mouth daily. (Patient not taking: No sig reported)    .  propranolol (INDERAL) 10 MG tablet Take 1 tablet (10 mg total) by mouth 3 (three) times daily. (Patient not taking: Reported on 10/07/2020) 90 tablet 5  . SYNTHROID 25 MCG tablet TAKE 1 TABLET DAILY BEFORE BREAKFAST FOR HYPOTHYROIDISM 90 tablet 0   Current Facility-Administered Medications  Medication Dose Route Frequency Provider Last Rate Last Admin  . 0.9 %  sodium chloride infusion  500 mL Intravenous Once Milus Banister, MD         Medication Side Effects: None  Allergies:  Allergies  Allergen Reactions  . Iodine Anaphylaxis    IV and topical forms. Other reaction(s): Unknown  . Levsin [Hyoscyamine Sulfate]     Vision problems/pt has glaucoma  . Salmon [Fish Allergy] Hives and Shortness Of Breath  . Shellfish Allergy Anaphylaxis  . Tramadol Swelling  . Remeron [Mirtazapine] Other (See Comments)    Cause blurred vision and red eyes, pt has glaucoma  . Aspirin Other (See Comments)    Sever stomach pain due to ulcer scaring.  . Bis Subcit-Metronid-Tetracyc Swelling    Tongue swelling. Face tingling Other reaction(s): Unknown  . Cephalexin Hives    Other reaction(s): hives  . Ciprofloxacin Diarrhea  . Codeine Nausea And Vomiting  . Contrast Media [Iodinated Diagnostic Agents]   . Cyclobenzaprine Other (See Comments)    Tingly/prickly sensation. Other reaction(s): tingly/prickly sensation  . Darvocet [Propoxyphene N-Acetaminophen] Nausea And Vomiting  . Demerol [Meperidine] Nausea Only  . Dexlansoprazole Swelling    Redness, swelling and peeling of both feet. Other reaction(s): foot pain  . Diphedryl [Diphenhydramine] Other (See Comments)    Increased pulse/small amount ok  . Doxycycline Hyclate Other (See Comments)    GI intolerance.  . Doxycycline Hyclate     Other reaction(s): GI intolerance  . Epinephrine Other (See Comments)    Breathing problems Other reaction(s): breathing problems/fainting  . Erythromycin Other (See Comments)    GI intolerance. Other reaction(s): GI  . Fish Oil     Other reaction(s): breathing problems/hives  . Hyoscyamine     Other reaction(s): eye pain  . Latex Other (See Comments)    Gloves ok.  Skin gets red from elastic in underwear and latex bandaides.  . Meperidine Hcl     Other reaction(s): vomiting  . Nitrofurantoin Diarrhea  . Other     Other reaction(s): migraines Other reaction(s): Unknown Other reaction(s): Unknown Other reaction(s): Unknown Other  reaction(s): increased pulse, faint, diarrhea  . Prednisone Other (See Comments)    Headache Other reaction(s): headache  . Ra Diphedryl Allergy [Diphenhydramine Hcl]     Other reaction(s): increased pulse small dose okay  . Sertraline Hcl Swelling and Other (See Comments)    Migraine Swelling of tongue/lip (09/2012) Other reaction(s): Unknown  . Shellfish-Derived Products     Other reaction(s): Unknown  . Sulfa Antibiotics Other (See Comments)    Increased pulse, fainting, diarrhea, thrush  . Wellbutrin [Bupropion] Other (See Comments)    Headaches  . Xylocaine [Lidocaine Hcl]     With epinephrine, given by dentist.  Speeded up heart rate and she passed out (occured twice, at dentist)  . Xylocaine [Lidocaine]     Other reaction(s): Unknown  . Biaxin [Clarithromycin] Rash    Started after completing 10 day course of 2000 mg /day, Lips swelling  . Ibuprofen Other (See Comments)    Motrin ok with a GI effect. Other reaction(s): rash Motrin okay with a GI effect    Past Medical History:  Diagnosis Date  . Bell's  palsy 1966   Hx: right side facial droop, resolved per patient 04/02/19  . Carotid artery disease ([place]) 2010   on vascular screening;unchanged 2013.(could not tolerate simvastatin, no other statins tried)--<30% blockage bilat 07/2011  . Chronic abdominal pain   . Chronic fatigue and malaise   . Claustrophobia   . Depression    treated in the past for years;stopped in 2010 for a years  . Duodenal ulcer 1962   h/o  . Dysrhythmia    ocassional PVC's, no current problems per patient on 04/02/19  . Fibromyalgia   . Frequent PVCs 07/2012   Seen by [facility]: benign, asymptomatic, normal EF  . GERD (gastroesophageal reflux disease)    diet controlled  . Glaucoma, narrow-angle    s/p laser surgery  . History of hiatal hernia    during endoscopy  . Hypothyroid 9/08  . IBS (irritable bowel syndrome)    Dr. Benson [place]  . Ischemic colitis ([place]) 11/21/2018   no current problems  per patient on 04/02/19  . Ocular migraine   . Osteoporosis 10/11   Dr.Hawkes  . Panic attack   . Parkinson disease ([place])   . Parkinson's disease ([place]) 06/23/2016  . Recurrent UTI    has cystocele-Dr.Grewal  . Shingles 1999   h/o  . Superficial thrombophlebitis 03/2009   RLE  . Trochanteric bursitis 12/2008   bilateral    Past Medical History, Surgical history, Social history, and Family history were reviewed and updated as appropriate.   Please see review of systems for further details on the patient's review from today.   Objective:   Physical Exam:  LMP  (LMP Unknown)   Physical Exam Constitutional:      General: She is not in acute distress. Musculoskeletal:        General: No deformity.  Neurological:     Mental Status: She is alert and oriented to person, place, and time.     Coordination: Coordination normal.  Psychiatric:        Attention and Perception: Attention and perception normal. She does not perceive auditory or visual hallucinations.        Mood and Affect: Mood normal. Mood is not anxious or depressed. Affect is not labile, blunt, angry or inappropriate.        Speech: Speech normal.        Behavior: Behavior normal.        Thought Content: Thought content normal. Thought content is not paranoid or delusional. Thought content does not include homicidal or suicidal ideation. Thought content does not include homicidal or suicidal plan.        Cognition and Memory: Cognition and memory normal.        Judgment: Judgment normal.     Comments: Insight intact     Lab Review:     Component Value Date/Time   NA 142 03/16/2020 1153   NA 141 09/03/2019 1117   K 4.2 03/16/2020 1153   CL 106 03/16/2020 1153   CO2 27 03/16/2020 1153   GLUCOSE 97 03/16/2020 1153   BUN 10 03/16/2020 1153   BUN 14 09/03/2019 1117   CREATININE 0.62 03/16/2020 1153   CREATININE 0.75 10/22/2018 1401   CREATININE 0.67 11/23/2016 0815   CALCIUM 9.6 03/16/2020 1153   PROT 6.8  09/03/2019 1117   ALBUMIN 4.2 09/03/2019 1117   AST 19 09/03/2019 1117   AST 17 10/22/2018 1401   ALT 4 09/03/2019 1117   ALT 7 10/22/2018 1401   ALKPHOS 83  09/03/2019 1117   BILITOT 1.0 09/03/2019 1117   BILITOT 0.8 10/22/2018 1401   GFRNONAA >60 03/16/2020 1153   GFRNONAA >60 10/22/2018 1401   GFRAA >60 03/16/2020 1153   GFRAA >60 10/22/2018 1401       Component Value Date/Time   WBC 8.7 03/16/2020 1153   RBC 4.85 03/16/2020 1153   HGB 14.3 03/16/2020 1153   HGB 14.2 01/22/2020 1213   HCT 45.5 03/16/2020 1153   HCT 42.5 01/22/2020 1213   PLT 316 03/16/2020 1153   PLT 268 01/22/2020 1213   MCV 93.8 03/16/2020 1153   MCV 91 01/22/2020 1213   MCH 29.5 03/16/2020 1153   MCHC 31.4 03/16/2020 1153   RDW 13.1 03/16/2020 1153   RDW 12.5 01/22/2020 1213   LYMPHSABS 1.8 01/22/2020 1213   MONOABS 0.9 04/08/2019 0516   EOSABS 0.2 01/22/2020 1213   BASOSABS 0.1 01/22/2020 1213    No results found for: POCLITH, LITHIUM   No results found for: PHENYTOIN, PHENOBARB, VALPROATE, CBMZ   .res Assessment: Plan:    Plan:  PDMP reviewed  1. Xanax 0.5mg  - 4 x daily prn anxiety    2  Increase Prozac 10mg  to 20mg  daily   RTC 4 weeks  Patient advised to contact office with any questions, adverse effects, or acute worsening in signs and symptoms.  Discussed potential benefits, risk, and side effects of benzodiazepines to include potential risk of tolerance and dependence, as well as possible drowsiness.  Advised patient not to drive if experiencing drowsiness and to take lowest possible effective dose to minimize risk of dependence and tolerance  Diagnoses and all orders for this visit:  Insomnia, unspecified type  Major depressive disorder, recurrent episode, moderate (HCC)  Generalized anxiety disorder  Panic attacks     Please see After Visit Summary for patient specific instructions.  Future Appointments  Date Time Provider [place]  12/03/2020 10:00 AM  Conan Bowens, RN ACP-ACP None  12/07/2020  1:00 PM Shanon Ace, LCSW CP-CP None  12/22/2020  1:00 PM Shanon Ace, LCSW CP-CP None  01/05/2021  3:00 PM Shanon Ace, LCSW CP-CP None  01/19/2021  3:00 PM Shanon Ace, LCSW CP-CP None  02/16/2021  3:00 PM Debbora Presto, NP GNA-GNA None  03/10/2021  3:00 PM [name], MD PFM-PFM [facility]    No orders of the defined types were placed in this encounter.   -------------------------------

## 2020-12-03 ENCOUNTER — Other Ambulatory Visit: Payer: Medicare Other | Admitting: *Deleted

## 2020-12-03 ENCOUNTER — Other Ambulatory Visit: Payer: Medicare Other

## 2020-12-03 DIAGNOSIS — Z515 Encounter for palliative care: Secondary | ICD-10-CM

## 2020-12-03 DIAGNOSIS — L570 Actinic keratosis: Secondary | ICD-10-CM

## 2020-12-03 DIAGNOSIS — L820 Inflamed seborrheic keratosis: Secondary | ICD-10-CM

## 2020-12-03 HISTORY — DX: Actinic keratosis: L57.0

## 2020-12-03 HISTORY — DX: Inflamed seborrheic keratosis: L82.0

## 2020-12-06 ENCOUNTER — Encounter: Payer: Self-pay | Admitting: *Deleted

## 2020-12-06 NOTE — Progress Notes (Signed)
COMMUNITY PALLIATIVE CARE SW NOTE  PATIENT NAME: Tracy Malone DOB: 12/12/1931 MRN: [id number]  PRIMARY CARE PROVIDER: [name], MD  RESPONSIBLE PARTY:  Acct ID - Guarantor Home Phone Work Phone Relationship Acct Type  [id number] [name] Self P/F     [address], [place], [place] [address]     PLAN OF CARE and INTERVENTIONS:             1. GOALS OF CARE/ ADVANCE CARE PLANNING:  Goal is for patient to remain at home with her husband. Patient is a DNR. 2. SOCIAL/EMOTIONAL/SPIRITUAL ASSESSMENT/ INTERVENTIONS:  SW and RN- Tracy Malone completed a visit with patient at her home. She was appeared to be in a good mood as she had music playing and she seem to be in good spirits. Patient advised that she is expecting to have her lift recliner delivered today. She is hoping this will help her back a she continues to have burning and pain down that starts in her back and goes dow her legs. She walks independently, but uses her walker at night. SW observed involuntary tremors in her hands. She expressed concern about her husband as he is having breathing difficulty-he was at the doctor during first part of our visit and then arrived later. She report that she is continues to have intermittent crying spells that is coupled with a depressed and anxious mood. Patient reported that she has been thinking about the root of her anxiety. She talked about how her son's incarceration has affected her, but she has been doing life review and realized it as another traumatic event that has contributed to her anxiety and depression. She tearfully shared that experience. SW provided active listening, supportive counseling, normalized and validated her feelings and encouraged her to continue to process this with her therapist. Patient remains open to ongoing palliative care support; SW to provide psychosocial assessment and support.  3. PATIENT/CAREGIVER EDUCATION/ COPING:  Patient is  processing past trauma, but is coping adequately. 4. PERSONAL EMERGENCY PLAN:  911 can be accessed for emergencies. SW reinforced access to palliative care support.  5. COMMUNITY RESOURCES COORDINATION/ HEALTH CARE NAVIGATION:  Patient continues to receive physical therapy 2x week. She is also seeing a therapist 1x week. Patient has a housekeeper 1-2x month. 6. FINANCIAL/LEGAL CONCERNS/INTERVENTIONS:  None.     SOCIAL HX:  Social History   Tobacco Use  . Smoking status: Never Smoker  . Smokeless tobacco: Never Used  Substance Use Topics  . Alcohol use: No    Alcohol/week: 0.0 standard drinks    CODE STATUS: DNR ADVANCED DIRECTIVES: No MOST FORM COMPLETE:  No HOSPICE EDUCATION PROVIDED: No  PPS: Patientcontinues toambulate independently, but unsteady at times.She is independent for personal care needs.  Patient is having persistent pain to her back. She is alert and oriented x3.  Duration of visit is 60 minutes       [employer], LCSW

## 2020-12-07 ENCOUNTER — Ambulatory Visit (INDEPENDENT_AMBULATORY_CARE_PROVIDER_SITE_OTHER): Payer: Medicare Other | Admitting: Psychiatry

## 2020-12-07 ENCOUNTER — Other Ambulatory Visit: Payer: Self-pay

## 2020-12-07 DIAGNOSIS — F331 Major depressive disorder, recurrent, moderate: Secondary | ICD-10-CM | POA: Diagnosis not present

## 2020-12-07 NOTE — Progress Notes (Signed)
[facility] Counselor/Therapist Progress Note  Patient ID: Tracy Malone, MRN: [id number],    Date: 12/07/2020  Time Spent:   Treatment Type: Individual Therapy  Reported Symptoms: Anxiety, depression, frustration, is having significant back issues and in treatment  Mental Status Exam:  Appearance:   Casual and Neat     Behavior:  Appropriate, Sharing and Motivated  Motor:  Typically walks with a cane or Rollator, sometimes on her own but does have some orthopedic issues that affect her mobility  Speech/Language:   Clear and Coherent  Affect:  Anxious, tearful  Mood:  anxious, depressed and Some tearfulness  Thought process:  Some ruminating  Thought content:    Some ruminating  Sensory/Perceptual disturbances:    WNL  Orientation:  oriented to person, place, time/date, situation, day of week, month of year and year  Attention:  Good  Concentration:  Good and Fair  Memory:  WNL  Fund of knowledge:   Good  Insight:    Good and Fair  Judgment:   Good  Impulse Control:  Good   Risk Assessment: Danger to Self:  No Self-injurious Behavior: No Danger to Others: No Duty to Warn:no Physical Aggression / Violence:No  Access to Firearms a concern: No  Gang Involvement:No   Subjective: Patient in today reporting anxiety, depression, some occasional sleep issues, and frustration.  She continues to have considerable back pain and is being treated for that.  Some of the treatment options for her are limited due to patient's desire not to have surgery nor certain types of medications.  Patient processed a lot of her anxious and depressive thoughts in session today, also looking at what she can control and what she cannot control.  It continues to be difficult for her to not automatically go to the negative side of things versus any positives, but is responding some better to some redirection and use this a lot and working with her today.  Encouraged her to work on this some at home  between sessions and if it would be helpful, to use written reminders as discussed in prior sessions.  Also looked at some of her depressive feelings that are reality based and how to better cope with some of them that are long-term situations, to which she responded doubtful initially but then the more we spoke she seemed to gain some self-confidence.  Continue to work with this in sessions and encouraged her to do the same at home.   Interventions: Solution-Oriented/Positive Psychology and Ego-Supportive  Diagnosis:   ICD-10-CM   1. Major depressive disorder, recurrent episode, moderate (HCC)  F33.1      Treatment Goal Plan: Patient not signing treatment plan on computer screen due to [place]. Treatment Goals: Treatment goals will remain on treatment plan as patient works with strategies to achieve her goals. Progress will be noted each session and documented in the "progress" section of note. Long term goal: Develop the ability to recognize, accept, and cope with feelings of depression. Short term goal: Verbalize any unresolved grief issues that may be contributing to depression. Strategy: Replace negative self-defeating self talk with verbalization of realistic and positive cognitive messages to lessen depression and improve mood.    Progress / Plan: Patient displayed some tearfulness today but it was appropriate and not very long lasting, and able to refocus on some more positives.  Husband is concerned that she overly looks for what might go wrong in situations versus right including assuming worst case scenarios.  While patient still struggles with this, I do see some progress in her more recently.  Her back pain continues to limit her in some ways physically.  Also states that "I get tired or as I get older".  Focus more today about letting go of some things that she cannot control, and being able to more successfully accept those things.  Strongly encouraged her again today to  let herself be more involved in outside activities that she is able to do, including having more social contacts with others either in her neighborhood or her church, or friends outside of either of those categories.  So today shared with her the information about "rock steady boxing classes" that are specifically for Parkinson's patients here locally.  She seemed pretty interested and I called and got specific information for her to follow up on.  Homework given regarding looking for positives versus negatives and noticing the change in her outlook, and also to reach out to other people within her neighborhood or church.  For her age, she seems to be doing fairly well in her moving about, and cognitively.  Encouraged patient to follow through with recommendations made today and described in note above and also in trying to look more for the positives versus negatives, interrupting anxious/negative thoughts and replacing them with more positive and realistic thoughts, and staying in the present versus the past or future focusing on what she can change versus cannot change.   Goal review and progress/challenges noted with patient.   Next appt within  2 weeks.   Shanon Ace, LCSW

## 2020-12-10 DIAGNOSIS — M5416 Radiculopathy, lumbar region: Secondary | ICD-10-CM | POA: Diagnosis not present

## 2020-12-14 ENCOUNTER — Encounter: Payer: Self-pay | Admitting: Family Medicine

## 2020-12-14 DIAGNOSIS — M5416 Radiculopathy, lumbar region: Secondary | ICD-10-CM | POA: Diagnosis not present

## 2020-12-14 NOTE — Progress Notes (Signed)
COMMUNITY PALLIATIVE CARE RN NOTE  PATIENT NAME: Tracy Malone DOB: 12-18-1931 MRN: [id number]  PRIMARY CARE PROVIDER: [name], MD  RESPONSIBLE PARTY: [name] (husband) Acct ID - Guarantor Home Phone Work Phone Relationship Acct Type  [id number] Tracy Malone, Tracy [phone] Self P/F     [address] [place], [place] [address]   Covid-19 Pre-screening Negative  PLAN OF CARE and INTERVENTION:  1. ADVANCE CARE PLANNING/GOALS OF CARE: Goal is for patient to have less back pain and improved mood. She is a Full code. 2. PATIENT/CAREGIVER EDUCATION: Symptom management, pain management, safe mobility/transfers, s/s of infection 3. DISEASE STATUS: Joint face-to-face visit made with Palliative care SW, [name]. Met with patient in her home. She remains alert and oriented x 4. She says she has been having more issues with tearful spells and anxiety over the past few days. She shared that she experienced a very traumatic experience as a young adult that she is just now being able to remember. MSW encouraged her to share this information with her Counselor. She continue to take Xanax daily for her anxiety. She is also concerned with her husband's health as issues arise. She continues to report pain in her left lower back/hip region that radiates down her left side and left leg. She says she is now been sent to an outpatient physical therapist who specializes in patient's with nerve related issues. She has been riding a stationary bicycle in her home daily starting at 3 minutes initially. She has added an extra minute when possible and is now up to 5 minutes about 3x/day. Her gait is unsteady at times. She seems to be leaning more to the right when walking. She does not use any assistive devices, except her walker at times when getting up in the middle of the night. She remains able to perform ADLs independently, but requires frequent rest periods due to fatigue. She did order a lift  chair for her living room, as she feels she has no comfortable chairs to sit in in her home. She is most comfortable lying in bed. She continues with a right hand tremor. She does not feel that her Carbidopa/Levodopa is working every 4.5 hours. She has an appointment with her Neurologist in July. She reports not having much of an appetite, but does try to eat several small meals/day. She denies any issues with urination or bowel movements. Will continue to monitor.   HISTORY OF PRESENT ILLNESS: This is a 87 yo female with a diagnosis of Parkinson'sdisease.She has a h/o GERD, IBS, ischemic colitis, hypothyroidism, essential tremors and osteoporosis.Palliative care team continues to follow patient and will visit monthly and PRN.    CODE STATUS: Full code ADVANCED DIRECTIVES: Y MOST FORM: no PPS: 50%   (Duration of visit and documentation 75 minutes)   [name], RN BSN

## 2020-12-16 DIAGNOSIS — M5416 Radiculopathy, lumbar region: Secondary | ICD-10-CM | POA: Diagnosis not present

## 2020-12-22 ENCOUNTER — Other Ambulatory Visit: Payer: Self-pay

## 2020-12-22 ENCOUNTER — Ambulatory Visit (INDEPENDENT_AMBULATORY_CARE_PROVIDER_SITE_OTHER): Payer: Medicare Other | Admitting: Psychiatry

## 2020-12-22 DIAGNOSIS — F331 Major depressive disorder, recurrent, moderate: Secondary | ICD-10-CM | POA: Diagnosis not present

## 2020-12-22 NOTE — Progress Notes (Signed)
[facility] Counselor/Therapist Progress Note  Patient ID: Tracy Malone, MRN: [id number],    Date: 12/22/2020  Time Spent: 60 minutes  Treatment Type: Individual Therapy  Reported Symptoms: Anxiety, depression, some tearfulness  Mental Status Exam:  Appearance:   Casual     Behavior:  Appropriate, Sharing and Motivated  Motor:  Normal  Speech/Language:   Clear and Coherent  Affect:   anxious, depressed  Mood:  anxious and depressed  Thought process:  goal directed  Thought content:    some ruminating  Sensory/Perceptual disturbances:    WNL  Orientation:  oriented to person, place, time/date, situation, day of week, month of year and year  Attention:  Good  Concentration:  Good  Memory:  WNL  Fund of knowledge:   Good  Insight:    Good and Fair  Judgment:   Good  Impulse Control:  Good   Risk Assessment: Danger to Self:  No Self-injurious Behavior: No Danger to Others: No Duty to Warn:no Physical Aggression / Violence:No  Access to Firearms a concern: No  Gang Involvement:No   Subjective:  Patient in today reporting anxiety, depression, and sad at times when she focuses on her physical problems and her husband's physical issues.  We were in the midst of discussing this when patient brought up a very sensitive subject related to family and with very tearful for a period of time.  (Not all details are included in this note due to patient privacy needs.)  She talked through her concerns on the family-related situation and by end of session seem to be at a better place although still feeling some sadness.  Patient will sometimes frequently tear up but not have prolonged sadness in sessions.  Although still somewhat teary after her session, patient stated several times that she was definitely okay to go home.  Her husband had transported her to session today and I talked with him afterwards and made him aware of after hours behavioral health services and the behavioral  health Hospital locally, just in case needed.  He did feel she would be okay once they got home as well and patient added that she felt like it would help if she got home and took one of her Xanax.  Part of her also seemed relieved that she had actually discussed some of the sensitive information in the session today.  Encouraged good self-care (including taking meds as prescribed, eating healthy, and getting rest) on her part and being in touch with other family as well as people who live nearby them that are supportive.  Already encouraging patient to get out a little more at home as she is able and be more interactive with people in her neighborhood which consists of other retired individuals who husband reports are typically very caring people.  We will see patient again within 2 weeks and they know they can call if needed before then.  (See progress/plan below.)  Interventions: Solution-Oriented/Positive Psychology and Ego-Supportive  Diagnosis:   ICD-10-CM   1. Major depressive disorder, recurrent episode, moderate (HCC)  F33.1     Treatment Goal Plan:  Patient not signing treatment plan on computer screen due to [place]. Treatment Goals: Treatment goals will remain on treatment plan as patient works with strategies to achieve her goals. Progress will be noted each session and documented in the "progress" section of note. Long term goal: Develop the ability to recognize, accept, and cope with feelings of depression. Short term goal: Verbalize any  unresolved grief issues that may be contributing to depression. Strategy: Replace negative self-defeating self talk with verbalization of realistic and positive cognitive messages to lessen depression and improve mood.    Progress / Plan:  Patient today was more tearful after sharing some sensitive information relating to herself and family.  She often has some tearfulness but it is usually short-lived.  Today she unexpectedly shared the  sensitive information and experienced increased tearfulness but eventually calmed some with reassurance and support.  Encouraged her again to practice good self-care at home as noted above and also positive self talk, especially between now and her next session which will be within 2 weeks.  Also encouraged patient to get outside some daily as she is able, to speak more with her neighbors, to not watch much TV where there is so much distressing news as this upsets patient, to be in personal or phone contact with other family, to stay on her medications as prescribed, to involve herself in puzzles/books/games/crafts there at her home that would be of interest to her and would help occupy some of her time in more positive ways.  Also encouraged her to attend a Wednesday morning service at her church that she spoke about wanting to try.  Patient was more grounded by the end of session but I explained to her and to her husband that transported her to her appointment, about the availability of after hours services if needed and also the local behavioral health Hospital and its location.  Patient denies any SI.  Patient was not expressing nor showing any signs of self harm or threat but she was much more emotional today, it seems based on what some of her conversation and sharing was about, and had calmed down considerably at the end of her appointment.   Goal review and progress/challenges noted with patient.   Next appt within 3 weeks.   Shanon Ace, LCSW

## 2020-12-23 DIAGNOSIS — M5416 Radiculopathy, lumbar region: Secondary | ICD-10-CM | POA: Diagnosis not present

## 2020-12-27 ENCOUNTER — Telehealth: Payer: Self-pay

## 2020-12-27 NOTE — Telephone Encounter (Signed)
SW scheduled a palliative care visit with patient for 6/8/@12pm .

## 2020-12-29 ENCOUNTER — Other Ambulatory Visit: Payer: Medicare Other | Admitting: *Deleted

## 2020-12-29 ENCOUNTER — Other Ambulatory Visit: Payer: Self-pay

## 2020-12-29 DIAGNOSIS — Z515 Encounter for palliative care: Secondary | ICD-10-CM

## 2020-12-30 ENCOUNTER — Ambulatory Visit: Payer: Medicare Other | Admitting: Adult Health

## 2020-12-31 DIAGNOSIS — H10412 Chronic giant papillary conjunctivitis, left eye: Secondary | ICD-10-CM | POA: Diagnosis not present

## 2020-12-31 DIAGNOSIS — L723 Sebaceous cyst: Secondary | ICD-10-CM | POA: Diagnosis not present

## 2021-01-03 ENCOUNTER — Ambulatory Visit (INDEPENDENT_AMBULATORY_CARE_PROVIDER_SITE_OTHER): Payer: Medicare Other | Admitting: Adult Health

## 2021-01-03 ENCOUNTER — Encounter: Payer: Self-pay | Admitting: Adult Health

## 2021-01-03 ENCOUNTER — Other Ambulatory Visit: Payer: Self-pay

## 2021-01-03 DIAGNOSIS — G4700 Insomnia, unspecified: Secondary | ICD-10-CM | POA: Diagnosis not present

## 2021-01-03 DIAGNOSIS — F331 Major depressive disorder, recurrent, moderate: Secondary | ICD-10-CM | POA: Diagnosis not present

## 2021-01-03 DIAGNOSIS — F411 Generalized anxiety disorder: Secondary | ICD-10-CM

## 2021-01-03 DIAGNOSIS — F410 Panic disorder [episodic paroxysmal anxiety] without agoraphobia: Secondary | ICD-10-CM

## 2021-01-03 MED ORDER — ALPRAZOLAM 0.5 MG PO TABS: ORAL_TABLET | ORAL | 2 refills | Status: DC

## 2021-01-03 MED ORDER — FLUOXETINE HCL 10 MG PO CAPS: 20.0000 mg | ORAL_CAPSULE | Freq: Every day | ORAL | 5 refills | Status: DC

## 2021-01-03 NOTE — Progress Notes (Signed)
Tracy Malone [id number] 1932/03/13 87 y.o.  Subjective:   Patient ID:  Tracy Malone is a 87 y.o. (DOB 1931-10-03) female.  Chief Complaint: No chief complaint on file.   HPI Tracy Malone presents to the office today for follow-up of MDD, GAD, panic attacks and insomnia.  Accompanied by husband - not present for interview.   Describes mood today as "not the best". Pleasant. Tearful at times. Mood symptoms - reports depression, anxiety, and irritability. Reports panic attacks. Feels like Prozac continues to be helpful. Taking 10mg  every other day - "I have visual changes with it, so taking it every other day is better for me". Worried about husband's health issues. Son getting out of prison in November. Trying to manage multiple medical issues. Diagnosed with Parkinson's in 2017. Varying interest and motivation. Taking medications as prescribed. Seeing therapist - Rockne Menghini. Energy levels lower. Using a cane to steady her gait. Active, does not have a regular exercise routine. Enjoys some usual interests and activities. Married. Lives with husband of 67 years. 2 children 59 and 55. Son lives in [place]. Spending time with family. Appetite adequate. Weight stable. Sleeping difficulties. Averages 5 to 7 hours - "it depends on the day I have". Focus and concentration stable. Completing tasks. Managing aspects of household. Retired.  Denies SI or HI.  Denies AH or VH.  Previous medication trials: Multiple medication trials    PHQ2-9    Flowsheet Row Office Visit from 09/08/2020 in [facility] Office Visit from 09/03/2019 in [facility] Patient Outreach Telephone from 12/02/2018 in [facility] Office Visit from 08/29/2018 in [facility] Office Visit from 08/27/2017 in [facility]  PHQ-2 Total Score 6 6 1 4 4   PHQ-9 Total Score 7 17 -- -- --        Review of Systems:  Review of Systems  Musculoskeletal:  Negative for gait  problem.  Neurological:  Negative for tremors.  Psychiatric/Behavioral:         Please refer to HPI   Medications: I have reviewed the patient's current medications.  Current Outpatient Medications  Medication Sig Dispense Refill   FLUoxetine (PROZAC) 10 MG capsule Take 2 capsules (20 mg total) by mouth daily. 30 capsule 5   acetaminophen (TYLENOL) 650 MG CR tablet Take 650 mg by mouth every 8 (eight) hours as needed for pain.  (Patient not taking: No sig reported)     ALPRAZolam (XANAX) 0.5 MG tablet Take four tablets daily as needed for anxiety. 120 tablet 2   b complex vitamins tablet Take 1 tablet by mouth daily. (Patient not taking: Reported on 10/07/2020)     calcium carbonate (OSCAL) 1500 (600 Ca) MG TABS tablet Take 600 mg by mouth 2 (two) times daily. (Patient not taking: Reported on 10/07/2020)     Carbidopa-Levodopa ER (SINEMET CR) 25-100 MG tablet controlled release Take 1 tablet by mouth in the morning, at noon, in the evening, and at bedtime. 360 tablet 3   Cholecalciferol (VITAMIN D) 2000 UNITS tablet Take 2,000 Units by mouth daily. (Patient not taking: Reported on 10/07/2020)     citalopram (CELEXA) 10 MG tablet Take 1/2 tablet daily. 7 tablet 0   dicyclomine (BENTYL) 10 MG capsule As needed (Patient not taking: No sig reported)     Probiotic Product (ALIGN Malone) Take 1 capsule by mouth daily. (Patient not taking: No sig reported)     propranolol (INDERAL) 10 MG tablet Take 1 tablet (10 mg  total) by mouth 3 (three) times daily. (Patient not taking: Reported on 10/07/2020) 90 tablet 5   SYNTHROID 25 MCG tablet TAKE 1 TABLET DAILY BEFORE BREAKFAST FOR HYPOTHYROIDISM 90 tablet 0   Current Facility-Administered Medications  Medication Dose Route Frequency Provider Last Rate Last Admin   0.9 %  sodium chloride infusion  500 mL Intravenous Once Rachael Fee, MD        Medication Side Effects: None  Allergies:  Allergies  Allergen Reactions   Iodine Anaphylaxis    IV and  topical forms. Other reaction(s): Unknown   Levsin [Hyoscyamine Sulfate]     Vision problems/pt has glaucoma   Salmon [Fish Allergy] Hives and Shortness Of Breath   Shellfish Allergy Anaphylaxis   Tramadol Swelling   Remeron [Mirtazapine] Other (See Comments)    Cause blurred vision and red eyes, pt has glaucoma   Aspirin Other (See Comments)    Sever stomach pain due to ulcer scaring.   Bis Subcit-Metronid-Tetracyc Swelling    Tongue swelling. Face tingling Other reaction(s): Unknown   Cephalexin Hives    Other reaction(s): hives   Ciprofloxacin Diarrhea   Codeine Nausea And Vomiting   Contrast Media [Iodinated Diagnostic Agents]    Cyclobenzaprine Other (See Comments)    Tingly/prickly sensation. Other reaction(s): tingly/prickly sensation   Darvocet [Propoxyphene N-Acetaminophen] Nausea And Vomiting   Demerol [Meperidine] Nausea Only   Dexlansoprazole Swelling    Redness, swelling and peeling of both feet. Other reaction(s): foot pain   Diphedryl [Diphenhydramine] Other (See Comments)    Increased pulse/small amount ok   Doxycycline Hyclate Other (See Comments)    GI intolerance.   Doxycycline Hyclate     Other reaction(s): GI intolerance   Epinephrine Other (See Comments)    Breathing problems Other reaction(s): breathing problems/fainting   Erythromycin Other (See Comments)    GI intolerance. Other reaction(s): GI   Fish Oil     Other reaction(s): breathing problems/hives   Hyoscyamine     Other reaction(s): eye pain   Latex Other (See Comments)    Gloves ok.  Skin gets red from elastic in underwear and latex bandaides.   Meperidine Hcl     Other reaction(s): vomiting   Nitrofurantoin Diarrhea   Other     Other reaction(s): migraines Other reaction(s): Unknown Other reaction(s): Unknown Other reaction(s): Unknown Other reaction(s): increased pulse, faint, diarrhea   Prednisone Other (See Comments)    Headache Other reaction(s): headache   Ra Diphedryl  Allergy [Diphenhydramine Hcl]     Other reaction(s): increased pulse small dose okay   Sertraline Hcl Swelling and Other (See Comments)    Migraine Swelling of tongue/lip (09/2012) Other reaction(s): Unknown   Shellfish-Derived Products     Other reaction(s): Unknown   Sulfa Antibiotics Other (See Comments)    Increased pulse, fainting, diarrhea, thrush   Wellbutrin [Bupropion] Other (See Comments)    Headaches   Xylocaine [Lidocaine Hcl]     With epinephrine, given by dentist.  Speeded up heart rate and she passed out (occured twice, at dentist)   Xylocaine [Lidocaine]     Other reaction(s): Unknown   Biaxin [Clarithromycin] Rash    Started after completing 10 day course of 2000 mg /day, Lips swelling   Ibuprofen Other (See Comments)    Motrin ok with a GI effect. Other reaction(s): rash Motrin okay with a GI effect    Past Medical History:  Diagnosis Date   Bell's palsy 1966   Hx: right side facial droop, resolved  per patient 04/02/19   Carotid artery disease (HCC) 2010   on vascular screening;unchanged 2013.(could not tolerate simvastatin, no other statins tried)--<30% blockage bilat 07/2011   Chronic abdominal pain    Chronic fatigue and malaise    Claustrophobia    Depression    treated in the past for years;stopped in 2010 for a years   Duodenal ulcer 1962   h/o   Dysrhythmia    ocassional PVC's, no current problems per patient on 04/02/19   Fibromyalgia    Frequent PVCs 07/2012   Seen by [facility]: benign, asymptomatic, normal EF   GERD (gastroesophageal reflux disease)    diet controlled   Glaucoma, narrow-angle    s/p laser surgery   History of hiatal hernia    during endoscopy   Hypothyroid 9/08   IBS (irritable bowel syndrome)    Dr. Elnoria Howard   Ischemic colitis (HCC) 11/21/2018   no current problems per patient on 04/02/19   Lichenoid keratosis 12/03/2020   Dr.Stinehelfer   Ocular migraine    Osteoporosis 10/11   Dr.Hawkes   Panic attack    Parkinson  disease (HCC)    Parkinson's disease (HCC) 06/23/2016   Recurrent UTI    has cystocele-Dr.Grewal   Shingles 1999   h/o   Superficial thrombophlebitis 03/2009   RLE   Trochanteric bursitis 12/2008   bilateral    Past Medical History, Surgical history, Social history, and Family history were reviewed and updated as appropriate.   Please see review of systems for further details on the patient's review from today.   Objective:   Physical Exam:  LMP  (LMP Unknown)   Physical Exam  Lab Review:     Component Value Date/Time   NA 142 03/16/2020 1153   NA 141 09/03/2019 1117   K 4.2 03/16/2020 1153   CL 106 03/16/2020 1153   CO2 27 03/16/2020 1153   GLUCOSE 97 03/16/2020 1153   BUN 10 03/16/2020 1153   BUN 14 09/03/2019 1117   CREATININE 0.62 03/16/2020 1153   CREATININE 0.75 10/22/2018 1401   CREATININE 0.67 11/23/2016 0815   CALCIUM 9.6 03/16/2020 1153   PROT 6.8 09/03/2019 1117   ALBUMIN 4.2 09/03/2019 1117   AST 19 09/03/2019 1117   AST 17 10/22/2018 1401   ALT 4 09/03/2019 1117   ALT 7 10/22/2018 1401   ALKPHOS 83 09/03/2019 1117   BILITOT 1.0 09/03/2019 1117   BILITOT 0.8 10/22/2018 1401   GFRNONAA >60 03/16/2020 1153   GFRNONAA >60 10/22/2018 1401   GFRAA >60 03/16/2020 1153   GFRAA >60 10/22/2018 1401       Component Value Date/Time   WBC 8.7 03/16/2020 1153   RBC 4.85 03/16/2020 1153   HGB 14.3 03/16/2020 1153   HGB 14.2 01/22/2020 1213   HCT 45.5 03/16/2020 1153   HCT 42.5 01/22/2020 1213   PLT 316 03/16/2020 1153   PLT 268 01/22/2020 1213   MCV 93.8 03/16/2020 1153   MCV 91 01/22/2020 1213   MCH 29.5 03/16/2020 1153   MCHC 31.4 03/16/2020 1153   RDW 13.1 03/16/2020 1153   RDW 12.5 01/22/2020 1213   LYMPHSABS 1.8 01/22/2020 1213   MONOABS 0.9 04/08/2019 0516   EOSABS 0.2 01/22/2020 1213   BASOSABS 0.1 01/22/2020 1213    No results found for: POCLITH, LITHIUM   No results found for: PHENYTOIN, PHENOBARB, VALPROATE, CBMZ    .res Assessment: Plan:     Plan:  PDMP reviewed  1. Xanax 0.5mg  -  4 x daily prn anxiety    2  Prozac 10mg  daily   RTC 6 weeks  Patient advised to contact office with any questions, adverse effects, or acute worsening in signs and symptoms.  Discussed potential benefits, risk, and side effects of benzodiazepines to include potential risk of tolerance and dependence, as well as possible drowsiness.  Advised patient not to drive if experiencing drowsiness and to take lowest possible effective dose to minimize risk of dependence and tolerance    Diagnoses and all orders for this visit:  Major depressive disorder, recurrent episode, moderate (HCC) -     FLUoxetine (PROZAC) 10 MG capsule; Take 2 capsules (20 mg total) by mouth daily.  Insomnia, unspecified type  Panic attacks  Generalized anxiety disorder -     ALPRAZolam (XANAX) 0.5 MG tablet; Take four tablets daily as needed for anxiety. -     FLUoxetine (PROZAC) 10 MG capsule; Take 2 capsules (20 mg total) by mouth daily.    Please see After Visit Summary for patient specific instructions.  Future Appointments  Date Time Provider Department Center  01/05/2021  3:00 PM Mathis Fare, LCSW [facility] None  01/19/2021  3:00 PM Mathis Fare, LCSW [facility] None  02/14/2021  2:00 PM Lida Berkery, Thereasa Solo, NP [facility] None  02/16/2021  3:00 PM Shawnie Dapper, NP [facility] None  03/10/2021  3:00 PM Joselyn Arrow, MD [facility]    No orders of the defined types were placed in this encounter.   -------------------------------

## 2021-01-04 DIAGNOSIS — M5416 Radiculopathy, lumbar region: Secondary | ICD-10-CM | POA: Diagnosis not present

## 2021-01-05 ENCOUNTER — Ambulatory Visit (INDEPENDENT_AMBULATORY_CARE_PROVIDER_SITE_OTHER): Payer: Medicare Other | Admitting: Psychiatry

## 2021-01-05 ENCOUNTER — Other Ambulatory Visit: Payer: Self-pay

## 2021-01-05 DIAGNOSIS — F331 Major depressive disorder, recurrent, moderate: Secondary | ICD-10-CM | POA: Diagnosis not present

## 2021-01-05 NOTE — Progress Notes (Signed)
[facility] Counselor/Therapist Progress Note  Patient ID: Tracy Malone, MRN: [id number],    Date: 01/05/2021  Time Spent: 60 minutes   Treatment Type: Individual Therapy  Reported Symptoms: anxiety, some depression  Mental Status Exam:  Appearance:   Casual and Neat     Behavior:  Appropriate and Sharing  Motor:  Uses cane in being mobile  Speech/Language:   Clear and Coherent  Affect:  Anxious, depressed  Mood:  anxious and depressed  Thought process:  goal directed  Thought content:    ruminating  Sensory/Perceptual disturbances:    WNL  Orientation:  oriented to person, place, time/date, situation, day of week, month of year, and year  Attention:  Good  Concentration:  Good and Fair  Memory:  WNL  Fund of knowledge:   Good  Insight:    Good and Fair  Judgment:   Good  Impulse Control:  Good   Risk Assessment: Danger to Self:  No Self-injurious Behavior: No Danger to Others: No Duty to Warn:no Physical Aggression / Violence:No  Access to Firearms a concern: No  Gang Involvement:No   Subjective:  Patient in today reporting anxiety and depression. Reports focusing a lot negatives and "worries" and family has also reported this is true for patient.  Is more leveled out today with less tearfulness.  Has not really followed through on homework of being around people more especially her neighbors and friends from church.  States she has just not felt like it recently but agrees to make that effort between now and next session within 2 to 3 weeks.  Discussed her anxiety and depression, both of which have not been as bad more recently per her report.  Discussed her son's situation of being incarcerated and due to get out in November and had some tearfulness about that.  Difficult for patient to take suggestions as she seems to already have preconceived ideas of what is not going to help but we are usually able to work through that in sessions to where she leaves the  possibility open that some things can change and she feel better and that certain techniques may be beneficial to her.  Working with her on her anxious/negative thoughts and trying to interrupt them more quickly and replace with more reality based and empowering/positive thought patterns.  Encouraging her also to not watch as much distressing and violent newscast on TV or online as they can be upsetting for her, and to focus more on positive behaviors such as visiting with neighbors, having contact with her church friends, talking with family members, reading books of interests, and listening to music which she has indicated that is helpful in the past.  Patient reflects a lot on her Parkinson's disease and how it limits her, but today was also able to see some of the possibilities of the things she can still do even with Parkinson's which is hopefully encouraging to her.  Encouraged her use of music to help cope as that is a positive outlet that has helped her many times before per her report.   Interventions: Solution-Oriented/Positive Psychology and Ego-Supportive  Diagnosis:   ICD-10-CM   1. Major depressive disorder, recurrent episode, moderate (HCC)  F33.1       Plan:  Patient today showing good motivation.  There is a tendency to look on the negative side of things such as various interventions that might help some of her anxiety and depression.  She actually is appearing today to  be less anxious and less depressed and this was noted throughout session.  Only had 2 instances of any tears and laughed several times depending on what she was talking about.  Encouraged patient in some of the things that we frequently recommend for her as they have proven to be helpful including consistent positive self-care and positive self talk, healthy nutrition, allowing for good sleep patterns, taking her meds as prescribed, staying in touch with family and friends who are supportive, get out of her home and be  more interactive with neighbors as she is able, and to stay in the present focusing on what she can control or change versus cannot.  Also encouraged her to feel good about the motivation she shows and working on goal-directed behaviors and in and out of session, as she works to move forward in a more positive direction leading to better overall emotional health.   Goal review and progress/challenges noted with patient.  Next appt within 3 weeks.   Shanon Ace, LCSW

## 2021-01-06 DIAGNOSIS — M5136 Other intervertebral disc degeneration, lumbar region: Secondary | ICD-10-CM | POA: Diagnosis not present

## 2021-01-19 ENCOUNTER — Ambulatory Visit (INDEPENDENT_AMBULATORY_CARE_PROVIDER_SITE_OTHER): Payer: Medicare Other | Admitting: Psychiatry

## 2021-01-19 ENCOUNTER — Other Ambulatory Visit: Payer: Self-pay

## 2021-01-19 DIAGNOSIS — F331 Major depressive disorder, recurrent, moderate: Secondary | ICD-10-CM

## 2021-01-19 NOTE — Progress Notes (Signed)
[facility] Counselor/Therapist Progress Note  Patient ID: Tracy Malone, MRN: [id number],    Date: 01/19/2021  Time Spent: 58 minutes   Treatment Type: Individual Therapy  Reported Symptoms: anxiety, depression, "worrying a lot", crying at times and may not feel sad nor know I'm crying  Mental Status Exam:  Appearance:   Casual and Neat     Behavior:  Appropriate, Sharing, and Motivated  Motor:  Normal; uses cane or rollator  Speech/Language:   Clear and Coherent  Affect:  Anxious, depression  Mood:  anxious and depressed  Thought process:  Some tangentiality  Thought content:    Some ruminating  Sensory/Perceptual disturbances:    WNL  Orientation:  oriented to person, place, time/date, situation, day of week, month of year, year, and stated date of January 19, 2021  Attention:  Good  Concentration:  Good and Fair  Memory:  [employer] of knowledge:   Good  Insight:    Good and Fair  Judgment:   Good  Impulse Control:  Good   Risk Assessment: Danger to Self:  No Self-injurious Behavior: No Danger to Others: No Duty to Warn:no Physical Aggression / Violence:No  Access to Firearms a concern: No  Gang Involvement:No   Subjective:  Patient in today reporting anxiety, depression, and "worrying a lot" some about her own health and "whether or not I really have Parkinson's" and I don't think I do sometimes.  Also worrying about incarcerated son and wanting more details.  Discussed her being more supportive versus questioning in speaking by phone with son. Husband tries to help but patient sometimes rejects it. Worked today on focusing more on the positives, having more contact with others (neighbors, family, church friends), and not assuming the worst or negatives.  Worked more specifically today on her habit of assuming the worst and assuming negatives, and also not trying something because "I know it will not work" when she is never tried it and when it is felt that it  might be helpful regarding some of her symptomology.  She is to focus more between sessions on connecting with other people and her husband will support this also.  She has opportunities within her neighborhood, with her adult daughter, and within her church where she could definitely have predictable time with people that care about her.  Staying cooped up in the house does not really work well for her for long periods of time and we discussed this and also the benefits of spending time with other people.  Encouraged her to also take breaks from her iPad where she spends a lot of time.  Also encouraged her to continue using music to help cope as that continues to be a positive outlet for her and she reports that it is helpful and soothing.    Interventions: Cognitive Behavioral Therapy and Solution-Oriented/Positive Psychology  Diagnosis:   ICD-10-CM   1. Major depressive disorder, recurrent episode, moderate (HCC)  F33.1        Treatment Goal Plan:  Patient not signing treatment plan on computer screen due to [place]. Treatment Goals: Treatment goals will remain on treatment plan as patient works with strategies to achieve her goals.  Progress will be noted each session and documented in the "progress" section of note. Long term goal: Develop the ability to recognize, accept, and cope with feelings of depression. Short term goal: Verbalize any unresolved grief issues that may be contributing to depression. Strategy: Replace negative self-defeating self talk  with verbalization of realistic and positive cognitive messages to lessen depression and improve mood.    Plan:  Patient today showing motivation although not quite as strong as in some sessions, but it did increase over the time of the session which was good.  Worked further on her tendency to see the negative side of things and looking for what might go wrong, encouraging her to instead look for what might go right and keeping her eyes  open for positives more than negatives.  Not as tearful today nor in the recent past per her report.  Does question "whether or not she really has Parkinson's disease" and plans to talk with her doctor further in an appointment soon.  Encouraged patient in some behaviors that have been helpful to her before between sessions and recommended the following: Healthy nutrition and some exercise and movement as she is able, consistent positive self talk, allowing for good sleep patterns, staying in touch with family and friends who are supportive, taking medication as prescribed, staying in the present focusing on what she can control or change versus cannot, getting out of her home more and trying to be more interactive with neighbors as she is able, and to recognize the motivation she is showing in working with goal-directed behaviors in and out of sessions as she works to move forward in a more positive direction leading to better emotional health.  Goal review and progress/challenges noted with patient.  Next appointment within 2 to 3 weeks.   Shanon Ace, LCSW

## 2021-01-26 DIAGNOSIS — M533 Sacrococcygeal disorders, not elsewhere classified: Secondary | ICD-10-CM | POA: Diagnosis not present

## 2021-01-26 NOTE — Progress Notes (Signed)
[facility] RN NOTE  PATIENT NAME: Tracy Malone DOB: [birth year] MRN: [id number]  PRIMARY CARE PROVIDER: [name], MD  RESPONSIBLE PARTY: [name] (husband) Acct ID - Guarantor Home Phone Work Phone Relationship Acct Type  [id number] Tracy, [phone] Self P/F     [address], [place], [place] [address]   Covid-19 Pre-screening Negative  PLAN OF CARE and INTERVENTION:  ADVANCE CARE PLANNING/GOALS OF CARE: Goal is for patient to remain at home with her husband and avoid hospitalizations. She wishes that her back felt better and will continue with PT. PATIENT/CAREGIVER EDUCATION: Symptom management, pain management, safe mobility DISEASE STATUS: Face-to-face visit completed in the home. Met with both patient and husband, who is also a Palliative care patient. She appears to be in a better mood today. No tearful episodes today. She continues with pain in her left side region in her abdomen, along with pain that radiates from her lower back down her left leg. She is currently receiving outpatient PT, however she states that her therapy was cancelled this week. She has a new lift chair where she is now able to sit more comfortably during the day. She reports having issues over the past 2 days of painful diarrhea. She says that she takes her Bentyl as needed for cramping and it does help. Her appetite continues to decrease. She states she doesn't really have much of an appetite. She mainly eats because she knows she needs to. She is aware of the foods that causes her problems and stays away from those items. She remains able to perform ADLs independently. Her gait is unsteady at times and she does use her walker when she has to get up in the middle of the night. No recent falls. She says she is very careful. She continues with right hand tremors, but they appear to be at baseline. Will continue to monitor.  HISTORY OF PRESENT ILLNESS: This is a 87 yo female  with a diagnosis of Parkinson's disease. She has a h/o GERD, IBS, ischemic colitis, hypothyroidism, essential tremors and osteoporosis. Palliative care team continues to follow patient and will visit monthly and PRN.             CODE STATUS: Full code ADVANCED DIRECTIVES: Y MOST FORM: no PPS: 50%   (Duration of visit and documentation 60 minutes)   [name], RN BSN

## 2021-01-27 DIAGNOSIS — H01023 Squamous blepharitis right eye, unspecified eyelid: Secondary | ICD-10-CM | POA: Diagnosis not present

## 2021-01-27 DIAGNOSIS — H04123 Dry eye syndrome of bilateral lacrimal glands: Secondary | ICD-10-CM | POA: Diagnosis not present

## 2021-01-27 DIAGNOSIS — H01026 Squamous blepharitis left eye, unspecified eyelid: Secondary | ICD-10-CM | POA: Diagnosis not present

## 2021-01-31 ENCOUNTER — Other Ambulatory Visit: Payer: Medicare Other | Admitting: *Deleted

## 2021-01-31 ENCOUNTER — Other Ambulatory Visit: Payer: Self-pay

## 2021-01-31 VITALS — BP 141/74 | HR 81 | Temp 97.7°F | Resp 19

## 2021-01-31 DIAGNOSIS — Z515 Encounter for palliative care: Secondary | ICD-10-CM

## 2021-02-01 NOTE — Progress Notes (Signed)
COMMUNITY PALLIATIVE CARE RN NOTE  PATIENT NAME: Tracy Malone DOB: Dec 10, 1931 MRN: [id number]  PRIMARY CARE PROVIDER: [name], MD  RESPONSIBLE PARTY: Tracy Malone (husband) Acct ID - Guarantor Home Phone Work Phone Relationship Acct Type  [id number] INFINITY, [id number] [phone] Self P/F     [address] [place], [place] [address]   Covid-19 Pre-screening Negative  PLAN OF CARE and INTERVENTION:  ADVANCE CARE PLANNING/GOALS OF CARE: Patient wants to remain at home with her husband. She is a Full code. PATIENT/CAREGIVER EDUCATION: Symptom management, safe mobility DISEASE STATUS: Face-to-face completed in patient's home. Received a call from patient requesting a RN visit. Upon arrival, patient is sitting up at her kitchen eating a waffle, but very emotional and tearful. Her biggest concern is that her husband, who is also a palliative care patient, is not doing well and she is not able to take care of him. Her daughter is currently out of town until Sunday. She says she received an injection in her left lower back/hip region due to persistent back pain. Initially, the pain had improved the following day, but she is now starting to experience pain again and says her legs "feel funny." She is moving at a very slow pace when standing and during ambulation. She continues to lean to the left when walking. Her gait is unsteady at times. She is very anxious, but says she has taken all of her medications so far today including her Xanax. I suggested that they consider an in home aid service 24/7 at least until her daughter returns back to [facility]. Both patient and husband agreed. I contacted our palliative care MSW for a list of agencies to assist. [facility] was selected. They were able to start with patient today. Called patient later in the evening to check in and she sounded much better and calmer. Will continue to monitor.   HISTORY OF PRESENT ILLNESS: This is a 87 yo female  with a diagnosis of Parkinson's disease. She has a h/o GERD, IBS, ischemic colitis, hypothyroidism, essential tremors and osteoporosis. Palliative care team continues to follow patient and will visit monthly and PRN.              CODE STATUS: Full code ADVANCED DIRECTIVES: Y MOST FORM: no PPS: 50%   PHYSICAL EXAM:   VITALS: Today's Vitals   01/31/21 1052  BP: (!) 141/74  Pulse: 81  Resp: 19  Temp: 97.7 F (36.5 C)  TempSrc: Temporal  SpO2: 98%  PainSc: 4      (Duration of visit and documentation 40 minutes)   [name], RN BSN

## 2021-02-03 ENCOUNTER — Other Ambulatory Visit: Payer: Self-pay | Admitting: Family Medicine

## 2021-02-03 DIAGNOSIS — E039 Hypothyroidism, unspecified: Secondary | ICD-10-CM

## 2021-02-07 ENCOUNTER — Ambulatory Visit (INDEPENDENT_AMBULATORY_CARE_PROVIDER_SITE_OTHER): Payer: Medicare Other | Admitting: Psychiatry

## 2021-02-07 DIAGNOSIS — F411 Generalized anxiety disorder: Secondary | ICD-10-CM

## 2021-02-07 NOTE — Progress Notes (Signed)
[facility] Counselor/Therapist Progress Note  Patient ID: Tracy Malone, MRN: [id number],    Date: 02/07/2021  Time Spent: 50 minutes   Virtual Visit via MyChart VIDEO Note: Please note we tried repeatedly to get the Video up and working consistently for patient. Very upsetting to patient and had to convert to telephone for rest of session.  Connected with patient by a video enabled telemedicine/telehealth application or telephone, with their informed consent, and verified patient privacy and that I am speaking with the correct person using two identifiers. I discussed the limitations, risks, security and privacy concerns of performing psychotherapy and management service by telephone and the availability of in person appointments. I also discussed with the patient that there may be a patient responsible charge related to this service. The patient expressed understanding and agreed to proceed. I discussed the treatment planning with the patient. The patient was provided an opportunity to ask questions and all were answered. The patient agreed with the plan and demonstrated an understanding of the instructions. The patient was advised to call  our office if  symptoms worsen or feel they are in a crisis state and need immediate contact.   Therapist Location: [facility] Patient Location: home     Treatment Type: Individual Therapy  Reported Symptoms: anxiety, depression, some tearfulness  Mental Status Exam:  Appearance:   N/A    telehealth  Behavior:  Appropriate, Sharing, and Motivated  Motor:  Uses rollator to walk  Speech/Language:   Clear and Coherent  Affect:  N/A    telehealth  Mood:  anxious and depressed  Thought process:  normal  Thought content:    Some ruminating  Sensory/Perceptual disturbances:    WNL  Orientation:  oriented to person, place, time/date, situation, day of week, month of year, year, and stated date of February 07, 2021  Attention:  Fair   Concentration:  Fair  Memory:  [place] of knowledge:   Good  Insight:    Good and Fair  Judgment:   Good  Impulse Control:  Good   Risk Assessment: Danger to Self:  No Self-injurious Behavior: No Danger to Others: No Duty to Warn:no Physical Aggression / Violence:No  Access to Firearms a concern: No  Gang Involvement:No    Subjective:  Patient today reporting anxiety, depression with anxiety being the stronger symptom.  Focused mostly on her "worrying and sadness regarding incarcerated son, her health issues, and her husband's health issues.  Patient is 87 years old and finds it very hard to hold onto any positives.  Denies any SI.  Helped her in refocusing some today on how she has managed stressors before with some success and also what helped in those cases, including family contact, positive affirmations that we have used in sessions, her faith, and her close relationship with her husband.  Did encourage her again to get out more within her neighborhood, which is a close-knit neighborhood of mostly senior citizens, however, patient is reluctant to interact very much with others there.  Discussed her excessive worrying and how it impacts her ability to feel more hopeful, in addition to the advantages of letting go of worrying and replace it with reality based and more positive thoughts.  Practiced this some in session and she was able to do it successfully.  Also discussed ways that she might be able to prompt herself more outside of sessions to use the strategies rather than to seek too quickly into worrying and only  seeing the negatives.  Also reminded her that music has been helpful in the past and helping her through these tough emotional times.  Husband remains very supportive.  Interventions: Solution-Oriented/Positive Psychology and Ego-Supportive  Diagnosis:   ICD-10-CM   1. Generalized anxiety disorder  F41.1        Treatment Goal Plan:  Patient not signing treatment  plan on computer screen due to [place]. Treatment Goals: Treatment goals will remain on treatment plan as patient works with strategies to achieve her goals.  Progress will be noted each session and documented in the "progress" section of note. Long term goal: Develop the ability to recognize, accept, and cope with feelings of depression. Short term goal: Verbalize any unresolved grief issues that may be contributing to depression. Strategy: Replace negative self-defeating self talk with verbalization of realistic and positive cognitive messages to lessen depression and improve mood.     Plan:  Patient today showing some motivation although not quite as strong as in previous sessions.  Again, over time and getting her to talk more and express feelings, her motivation did increase as well as some hopefulness.  Encouraged patient to use some behaviors that have proven to be helpful to her between sessions previously including: Consistent positive self talk, allowing for better sleep patterns, healthy nutrition and some exercise as she is able, staying in touch with family and friends who are supportive, staying in the present focusing on what she can control, remaining on medication as prescribed, getting outside of her home more and trying to be more interactive with neighbors, and to feel good about the strength she is showing and working with goal-directed behaviors in and out of sessions as she tries to move forward in a more positive direction of improved emotional health.  Goal review and progress/challenges noted with patient.  Next appt within 3 weeks.   Shanon Ace, LCSW

## 2021-02-14 ENCOUNTER — Telehealth: Payer: Self-pay | Admitting: Neurology

## 2021-02-14 ENCOUNTER — Telehealth: Payer: Self-pay | Admitting: Adult Health

## 2021-02-14 ENCOUNTER — Ambulatory Visit: Payer: Medicare Other | Admitting: Adult Health

## 2021-02-14 ENCOUNTER — Other Ambulatory Visit: Payer: Self-pay | Admitting: Chiropractic Medicine

## 2021-02-14 DIAGNOSIS — M5459 Other low back pain: Secondary | ICD-10-CM

## 2021-02-14 NOTE — Telephone Encounter (Signed)
Pt lm sounded like she was in distress with a very teary voice and labored breathing. Stating to please call. # [phone]

## 2021-02-14 NOTE — Telephone Encounter (Signed)
Pt called, feel iCarbidopa-Levodopa ER (SINEMET CR) 25-100 MG tablet controlled releases not working. Need adjustment on this medication. Would like a call from the nurse to discuss a sooner appt than October.

## 2021-02-14 NOTE — Telephone Encounter (Signed)
Rtc to pt, she wasn't in distress then. She just reports her Xanax doesn't seem to be working anymore for her anxiety and reports she is just crying all the time and doesn't know. She has apt over the phone with Barnett Applebaum tomorrow so I informed her she would probably talk to her more then before changing anything but I would update her to see. She said that's fine and appreciated my call.

## 2021-02-14 NOTE — Telephone Encounter (Signed)
Will discuss at appointment.

## 2021-02-14 NOTE — Telephone Encounter (Signed)
Called pt back. She had to cx appt w/ AL,NP on 02/16/21 d/t severe leg/knee pain. She did not sleep at all last night. She had shot in back 01/27/21 via Dr. Nelva Bush. She called his office today to report sx, waiting on call back. I rescheduled her appt to 02/21/21 at 230p w/ [name]. She states husband having vertigo issues and normally takes her to appt. If he is not feeling better by 08/01 to bring her, she will call to r/s.

## 2021-02-14 NOTE — Telephone Encounter (Signed)
noted 

## 2021-02-15 ENCOUNTER — Encounter: Payer: Self-pay | Admitting: Adult Health

## 2021-02-15 ENCOUNTER — Telehealth: Payer: Medicare Other | Admitting: Adult Health

## 2021-02-15 ENCOUNTER — Telehealth (INDEPENDENT_AMBULATORY_CARE_PROVIDER_SITE_OTHER): Payer: Medicare Other | Admitting: Adult Health

## 2021-02-15 DIAGNOSIS — G4700 Insomnia, unspecified: Secondary | ICD-10-CM | POA: Diagnosis not present

## 2021-02-15 DIAGNOSIS — F411 Generalized anxiety disorder: Secondary | ICD-10-CM | POA: Diagnosis not present

## 2021-02-15 DIAGNOSIS — F410 Panic disorder [episodic paroxysmal anxiety] without agoraphobia: Secondary | ICD-10-CM | POA: Diagnosis not present

## 2021-02-15 DIAGNOSIS — F331 Major depressive disorder, recurrent, moderate: Secondary | ICD-10-CM

## 2021-02-15 NOTE — Progress Notes (Signed)
Tracy Malone PW:[id number] 05-01-1932 87 y.o.  Virtual Visit via Telephone Note  I connected with pt on 02/15/21 at  1:40 PM EDT by telephone and verified that I am speaking with the correct person using two identifiers.   I discussed the limitations, risks, security and privacy concerns of performing an evaluation and management service by telephone and the availability of in person appointments. I also discussed with the patient that there may be a patient responsible charge related to this service. The patient expressed understanding and agreed to proceed.   I discussed the assessment and treatment plan with the patient. The patient was provided an opportunity to ask questions and all were answered. The patient agreed with the plan and demonstrated an understanding of the instructions.   The patient was advised to call back or seek an in-person evaluation if the symptoms worsen or if the condition fails to improve as anticipated.  I provided 30 minutes of non-face-to-face time during this encounter.  The patient was located at home.  The provider was located at [place].   Tracy Gell, NP   Subjective:   Patient ID:  Tracy Malone is a 87 y.o. (DOB [birth year]) female.  Chief Complaint: No chief complaint on file.   HPI Tracy Malone presents for follow-up of MDD, GAD, panic attacks and insomnia.  Accompanied by husband - not present for interview.   Describes mood today as "not the best". Pleasant. Tearful at times. Mood symptoms - reports depression, anxiety, and irritability. Reports panic attacks. Feels like Prozac continues to be helpful. Taking '10mg'$  every other day - "I have visual changes with it, so taking it every other day is better for me". Worried about husband's health issues. Son getting out of prison in November. Trying to manage multiple medical issues. Diagnosed with Parkinson's in 2017. Varying interest and motivation. Taking medications as  prescribed. Seeing therapist - Tracy Malone. Energy levels lower. Using a cane to steady her gait. Active, does not have a regular exercise routine. Enjoys some usual interests and activities. Married. Lives with husband of 73 years. 2 children 59 and 55. Son lives in [place]. Spending time with family. Appetite adequate. Weight stable. Sleeping difficulties. Averages 5 to 7 hours - "it depends on the day I have". Focus and concentration stable. Completing tasks. Managing aspects of household. Retired.  Denies SI or HI.  Denies AH or VH.  Previous medication trials: Multiple medication trials   Review of Systems:  Review of Systems  Musculoskeletal:  Negative for gait problem.  Neurological:  Negative for tremors.  Psychiatric/Behavioral:         Please refer to HPI   Medications: I have reviewed the patient's current medications.  Current Outpatient Medications  Medication Sig Dispense Refill   acetaminophen (TYLENOL) 650 MG CR tablet Take 650 mg by mouth every 8 (eight) hours as needed for pain.  (Patient not taking: No sig reported)     ALPRAZolam (XANAX) 0.5 MG tablet Take four tablets daily as needed for anxiety. 120 tablet 2   b complex vitamins tablet Take 1 tablet by mouth daily. (Patient not taking: Reported on 10/07/2020)     calcium carbonate (OSCAL) 1500 (600 Ca) MG TABS tablet Take 600 mg by mouth 2 (two) times daily. (Patient not taking: Reported on 10/07/2020)     Carbidopa-Levodopa ER (SINEMET CR) 25-100 MG tablet controlled release Take 1 tablet by mouth in the morning, at noon, in the evening, and at bedtime.  360 tablet 3   Cholecalciferol (VITAMIN D) 2000 UNITS tablet Take 2,000 Units by mouth daily. (Patient not taking: Reported on 10/07/2020)     citalopram (CELEXA) 10 MG tablet Take 1/2 tablet daily. 7 tablet 0   dicyclomine (BENTYL) 10 MG capsule As needed (Patient not taking: No sig reported)     FLUoxetine (PROZAC) 10 MG capsule Take 2 capsules (20 mg total) by mouth  daily. 30 capsule 5   Probiotic Product (ALIGN PO) Take 1 capsule by mouth daily. (Patient not taking: No sig reported)     propranolol (INDERAL) 10 MG tablet Take 1 tablet (10 mg total) by mouth 3 (three) times daily. (Patient not taking: Reported on 10/07/2020) 90 tablet 5   SYNTHROID 25 MCG tablet TAKE 1 TABLET DAILY BEFORE BREAKFAST FOR HYPOTHYROIDISM 90 tablet 0   Current Facility-Administered Medications  Medication Dose Route Frequency Provider Last Rate Last Admin   0.9 %  sodium chloride infusion  500 mL Intravenous Once Milus Banister, MD        Medication Side Effects: None  Allergies:  Allergies  Allergen Reactions   Iodine Anaphylaxis    IV and topical forms. Other reaction(s): Unknown   Levsin [Hyoscyamine Sulfate]     Vision problems/pt has glaucoma   [place] [Fish Allergy] Hives and Shortness Of Breath   Shellfish Allergy Anaphylaxis   Tramadol Swelling   Remeron [Mirtazapine] Other (See Comments)    Cause blurred vision and red eyes, pt has glaucoma   Aspirin Other (See Comments)    Sever stomach pain due to ulcer scaring.   Bis Subcit-Metronid-Tetracyc Swelling    Tongue swelling. Face tingling Other reaction(s): Unknown   Cephalexin Hives    Other reaction(s): hives   Ciprofloxacin Diarrhea   Codeine Nausea And Vomiting   Contrast Media [Iodinated Diagnostic Agents]    Cyclobenzaprine Other (See Comments)    Tingly/prickly sensation. Other reaction(s): tingly/prickly sensation   Darvocet [Propoxyphene N-Acetaminophen] Nausea And Vomiting   Demerol [Meperidine] Nausea Only   Dexlansoprazole Swelling    Redness, swelling and peeling of both feet. Other reaction(s): foot pain   Diphedryl [Diphenhydramine] Other (See Comments)    Increased pulse/small amount ok   Doxycycline Hyclate Other (See Comments)    GI intolerance.   Doxycycline Hyclate     Other reaction(s): GI intolerance   Epinephrine Other (See Comments)    Breathing problems Other  reaction(s): breathing problems/fainting   Erythromycin Other (See Comments)    GI intolerance. Other reaction(s): GI   Fish Oil     Other reaction(s): breathing problems/hives   Hyoscyamine     Other reaction(s): eye pain   Latex Other (See Comments)    Gloves ok.  Skin gets red from elastic in underwear and latex bandaides.   Meperidine Hcl     Other reaction(s): vomiting   Nitrofurantoin Diarrhea   Other     Other reaction(s): migraines Other reaction(s): Unknown Other reaction(s): Unknown Other reaction(s): Unknown Other reaction(s): increased pulse, faint, diarrhea   Prednisone Other (See Comments)    Headache Other reaction(s): headache   Ra Diphedryl Allergy [Diphenhydramine Hcl]     Other reaction(s): increased pulse small dose okay   Sertraline Hcl Swelling and Other (See Comments)    Migraine Swelling of tongue/lip (09/2012) Other reaction(s): Unknown   Shellfish-Derived Products     Other reaction(s): Unknown   Sulfa Antibiotics Other (See Comments)    Increased pulse, fainting, diarrhea, thrush   Wellbutrin [Bupropion] Other (See Comments)  Headaches   Xylocaine [Lidocaine Hcl]     With epinephrine, given by dentist.  Speeded up heart rate and she passed out (occured twice, at dentist)   Xylocaine [Lidocaine]     Other reaction(s): Unknown   Biaxin [Clarithromycin] Rash    Started after completing 10 day course of 2000 mg /day, Lips swelling   Ibuprofen Other (See Comments)    Motrin ok with a GI effect. Other reaction(s): rash Motrin okay with a GI effect    Past Medical History:  Diagnosis Date   Bell's palsy 1966   Hx: right side facial droop, resolved per patient 04/02/19   Carotid artery disease ([place]) 2010   on vascular screening;unchanged 2013.(could not tolerate simvastatin, no other statins tried)--<30% blockage bilat 07/2011   Chronic abdominal pain    Chronic fatigue and malaise    Claustrophobia    Depression    treated in the past for  years;stopped in 2010 for a years   Duodenal ulcer 1962   h/o   Dysrhythmia    ocassional PVC's, no current problems per patient on 04/02/19   Fibromyalgia    Frequent PVCs 07/2012   Seen by [facility]: benign, asymptomatic, normal EF   GERD (gastroesophageal reflux disease)    diet controlled   Glaucoma, narrow-angle    s/p laser surgery   History of hiatal hernia    during endoscopy   Hypothyroid 9/08   IBS (irritable bowel syndrome)    Dr. Benson [place]   Ischemic colitis ([place]) 11/21/2018   no current problems per patient on [id number]   Lichenoid keratosis [id number]   Dr.Stinehelfer   Ocular migraine    Osteoporosis 10/11   Dr.Hawkes   Panic attack    Parkinson disease ([place])    Parkinson's disease ([place]) 06/23/2016   Recurrent UTI    has cystocele-Dr.Grewal   Shingles 1999   h/o   Superficial thrombophlebitis 03/2009   RLE   Trochanteric bursitis 12/2008   bilateral    Family History  Problem Relation Age of Onset   Heart disease Mother    Hypertension Mother    Hypertension Sister    HIV Son    Heart disease Brother    Lung cancer Brother        lung   Diabetes Maternal Grandfather    Diabetes Granddaughter        type 1   Colon cancer Neg Hx    Esophageal cancer Neg Hx    Prostate cancer Neg Hx    Stomach cancer Neg Hx    Rectal cancer Neg Hx     Social History   Socioeconomic History   Marital status: Married    Spouse name: Gwyndolyn Saxon   Number of children: 2   Years of education: Not on file   Highest education level: Not on file  Occupational History   Occupation: retired (school system)  Tobacco Use   Smoking status: Never   Smokeless tobacco: Never  Vaping Use   Vaping Use: Never used  Substance and Sexual Activity   Alcohol use: No    Alcohol/week: 0.0 standard drinks   Drug use: No   Sexual activity: Not Currently    Partners: Male    Birth control/protection: Other-see comments    Comment: Hysterectomy  Other Topics Concern   Not on file   Social History Narrative   Married.  Son lives in [place]; Daughter [name] lives in [place]; 2 grandchildren   Social Determinants of Health   Financial  Resource Strain: Not on file  Food Insecurity: Not on file  Transportation Needs: Not on file  Physical Activity: Not on file  Stress: Not on file  Social Connections: Not on file  Intimate Partner Violence: Not on file    Past Medical History, Surgical history, Social history, and Family history were reviewed and updated as appropriate.   Please see review of systems for further details on the patient's review from today.   Objective:   Physical Exam:  LMP  (LMP Unknown)   Physical Exam Neurological:     Mental Status: She is alert and oriented to person, place, and time.     Cranial Nerves: No dysarthria.  Psychiatric:        Attention and Perception: Attention and perception normal.        Mood and Affect: Mood normal.        Speech: Speech normal.        Behavior: Behavior is cooperative.        Thought Content: Thought content normal. Thought content is not paranoid or delusional. Thought content does not include homicidal or suicidal ideation. Thought content does not include homicidal or suicidal plan.        Cognition and Memory: Cognition and memory normal.        Judgment: Judgment normal.     Comments: Insight intact    Lab Review:     Component Value Date/Time   NA 142 03/16/2020 1153   NA 141 09/03/2019 1117   K 4.2 03/16/2020 1153   CL 106 03/16/2020 1153   CO2 27 03/16/2020 1153   GLUCOSE 97 03/16/2020 1153   BUN 10 03/16/2020 1153   BUN 14 09/03/2019 1117   CREATININE 0.62 03/16/2020 1153   CREATININE 0.75 10/22/2018 1401   CREATININE 0.67 11/23/2016 0815   CALCIUM 9.6 03/16/2020 1153   PROT 6.8 09/03/2019 1117   ALBUMIN 4.2 09/03/2019 1117   AST 19 09/03/2019 1117   AST 17 10/22/2018 1401   ALT 4 09/03/2019 1117   ALT 7 10/22/2018 1401   ALKPHOS 83 09/03/2019 1117   BILITOT 1.0 09/03/2019  1117   BILITOT 0.8 10/22/2018 1401   GFRNONAA >60 03/16/2020 1153   GFRNONAA >60 10/22/2018 1401   GFRAA >60 03/16/2020 1153   GFRAA >60 10/22/2018 1401       Component Value Date/Time   WBC 8.7 03/16/2020 1153   RBC 4.85 03/16/2020 1153   HGB 14.3 03/16/2020 1153   HGB 14.2 01/22/2020 1213   HCT 45.5 03/16/2020 1153   HCT 42.5 01/22/2020 1213   PLT 316 03/16/2020 1153   PLT 268 01/22/2020 1213   MCV 93.8 03/16/2020 1153   MCV 91 01/22/2020 1213   MCH 29.5 03/16/2020 1153   MCHC 31.4 03/16/2020 1153   RDW 13.1 03/16/2020 1153   RDW 12.5 01/22/2020 1213   LYMPHSABS 1.8 01/22/2020 1213   MONOABS 0.9 04/08/2019 0516   EOSABS 0.2 01/22/2020 1213   BASOSABS 0.1 01/22/2020 1213    No results found for: POCLITH, LITHIUM   No results found for: PHENYTOIN, PHENOBARB, VALPROATE, CBMZ   .res Assessment: Plan:    Plan:  PDMP reviewed  1. Xanax 0.'5mg'$  - 4 x daily prn anxiety    2  Prozac '10mg'$  daily   RTC 6 weeks  Patient advised to contact office with any questions, adverse effects, or acute worsening in signs and symptoms.  Discussed potential benefits, risk, and side effects of benzodiazepines to include potential risk  of tolerance and dependence, as well as possible drowsiness.  Advised patient not to drive if experiencing drowsiness and to take lowest possible effective dose to minimize risk of dependence and tolerance   There are no diagnoses linked to this encounter.  Please see After Visit Summary for patient specific instructions.  Future Appointments  Date Time Provider [place]  02/21/2021  2:30 PM Sater, Nanine Means, MD [facility] None  02/22/2021  3:00 PM Shanon Ace, LCSW CP-CP None  03/08/2021  3:00 PM Shanon Ace, LCSW CP-CP None  03/10/2021  3:00 PM [name], MD P[facility] [place]  03/22/2021  3:00 PM Shanon Ace, LCSW CP-CP None    No orders of the defined types were placed in this encounter.     -------------------------------

## 2021-02-15 NOTE — Progress Notes (Signed)
Tracy Malone [id number] [birth year] 87 y.o.  Virtual Visit via Telephone Note  I connected with pt on 02/15/21 at  2:40 PM EDT by telephone and verified that I am speaking with the correct person using two identifiers.   I discussed the limitations, risks, security and privacy concerns of performing an evaluation and management service by telephone and the availability of in person appointments. I also discussed with the patient that there may be a patient responsible charge related to this service. The patient expressed understanding and agreed to proceed.   I discussed the assessment and treatment plan with the patient. The patient was provided an opportunity to ask questions and all were answered. The patient agreed with the plan and demonstrated an understanding of the instructions.   The patient was advised to call back or seek an in-person evaluation if the symptoms worsen or if the condition fails to improve as anticipated.  I provided 30 minutes of non-face-to-face time during this encounter.  The patient was located at home.  The provider was located at [place].   Aloha Gell, NP   Subjective:   Patient ID:  Tracy Malone is a 87 y.o. (DOB 07-Mar-1932) female.  Chief Complaint: No chief complaint on file.   HPI Tracy Malone presents for follow-up of MDD, GAD, panic attacks and insomnia.  Describes mood today as "not very good". Pleasant. Tearful at times. Mood symptoms - reports depression, anxiety, and irritability. Reports panic attacks. Started taking Prozac every other day and stopped it a week ago. Mood started to decline over the past week. Restarted Prozac '10mg'$  this morning and plans to remain compliant. Stating "my daughter said I did better with it". Worried about husband's health issues. Diagnosed with Parkinson's in 2017. Recent visit with Opthalmologist - eyes normal for age. Varying interest and motivation. Taking medications as prescribed.  Seeing therapist - Rinaldo Cloud. Energy levels lower. Using a cane to steady her gait. Active, does not have a regular exercise routine. Enjoys some usual interests and activities. Married. Lives with husband of 17 years. 2 children 59 and 55. Son lives in [place]. Spending time with family. Appetite adequate. Weight stable. Sleeping difficulties. Averages 5 to 7 hours. Focus and concentration stable. Completing tasks. Managing aspects of household. Retired.  Denies SI or HI.  Denies AH or VH.  Previous medication trials: Multiple medication trials    Review of Systems:  Review of Systems  Musculoskeletal:  Negative for gait problem.  Neurological:  Negative for tremors.  Psychiatric/Behavioral:         Please refer to HPI   Medications: I have reviewed the patient's current medications.  Current Outpatient Medications  Medication Sig Dispense Refill   acetaminophen (TYLENOL) 650 MG CR tablet Take 650 mg by mouth every 8 (eight) hours as needed for pain.  (Patient not taking: No sig reported)     ALPRAZolam (XANAX) 0.5 MG tablet Take four tablets daily as needed for anxiety. 120 tablet 2   b complex vitamins tablet Take 1 tablet by mouth daily. (Patient not taking: Reported on 10/07/2020)     calcium carbonate (OSCAL) 1500 (600 Ca) MG TABS tablet Take 600 mg by mouth 2 (two) times daily. (Patient not taking: Reported on 10/07/2020)     Carbidopa-Levodopa ER (SINEMET CR) 25-100 MG tablet controlled release Take 1 tablet by mouth in the morning, at noon, in the evening, and at bedtime. 360 tablet 3   Cholecalciferol (VITAMIN D) 2000 UNITS tablet  Take 2,000 Units by mouth daily. (Patient not taking: Reported on 10/07/2020)     citalopram (CELEXA) 10 MG tablet Take 1/2 tablet daily. 7 tablet 0   dicyclomine (BENTYL) 10 MG capsule As needed (Patient not taking: No sig reported)     FLUoxetine (PROZAC) 10 MG capsule Take 2 capsules (20 mg total) by mouth daily. 30 capsule 5   Probiotic Product  (ALIGN PO) Take 1 capsule by mouth daily. (Patient not taking: No sig reported)     propranolol (INDERAL) 10 MG tablet Take 1 tablet (10 mg total) by mouth 3 (three) times daily. (Patient not taking: Reported on 10/07/2020) 90 tablet 5   SYNTHROID 25 MCG tablet TAKE 1 TABLET DAILY BEFORE BREAKFAST FOR HYPOTHYROIDISM 90 tablet 0   Current Facility-Administered Medications  Medication Dose Route Frequency Provider Last Rate Last Admin   0.9 %  sodium chloride infusion  500 mL Intravenous Once Milus Banister, MD        Medication Side Effects: None  Allergies:  Allergies  Allergen Reactions   Iodine Anaphylaxis    IV and topical forms. Other reaction(s): Unknown   Levsin [Hyoscyamine Sulfate]     Vision problems/pt has glaucoma   Salmon [Fish Allergy] Hives and Shortness Of Breath   Shellfish Allergy Anaphylaxis   Tramadol Swelling   Remeron [Mirtazapine] Other (See Comments)    Cause blurred vision and red eyes, pt has glaucoma   Aspirin Other (See Comments)    Sever stomach pain due to ulcer scaring.   Bis Subcit-Metronid-Tetracyc Swelling    Tongue swelling. Face tingling Other reaction(s): Unknown   Cephalexin Hives    Other reaction(s): hives   Ciprofloxacin Diarrhea   Codeine Nausea And Vomiting   Contrast Media [Iodinated Diagnostic Agents]    Cyclobenzaprine Other (See Comments)    Tingly/prickly sensation. Other reaction(s): tingly/prickly sensation   Darvocet [Propoxyphene N-Acetaminophen] Nausea And Vomiting   Demerol [Meperidine] Nausea Only   Dexlansoprazole Swelling    Redness, swelling and peeling of both feet. Other reaction(s): foot pain   Diphedryl [Diphenhydramine] Other (See Comments)    Increased pulse/small amount ok   Doxycycline Hyclate Other (See Comments)    GI intolerance.   Doxycycline Hyclate     Other reaction(s): GI intolerance   Epinephrine Other (See Comments)    Breathing problems Other reaction(s): breathing problems/fainting    Erythromycin Other (See Comments)    GI intolerance. Other reaction(s): GI   Fish Oil     Other reaction(s): breathing problems/hives   Hyoscyamine     Other reaction(s): eye pain   Latex Other (See Comments)    Gloves ok.  Skin gets red from elastic in underwear and latex bandaides.   Meperidine Hcl     Other reaction(s): vomiting   Nitrofurantoin Diarrhea   Other     Other reaction(s): migraines Other reaction(s): Unknown Other reaction(s): Unknown Other reaction(s): Unknown Other reaction(s): increased pulse, faint, diarrhea   Prednisone Other (See Comments)    Headache Other reaction(s): headache   Ra Diphedryl Allergy [Diphenhydramine Hcl]     Other reaction(s): increased pulse small dose okay   Sertraline Hcl Swelling and Other (See Comments)    Migraine Swelling of tongue/lip (09/2012) Other reaction(s): Unknown   Shellfish-Derived Products     Other reaction(s): Unknown   Sulfa Antibiotics Other (See Comments)    Increased pulse, fainting, diarrhea, thrush   Wellbutrin [Bupropion] Other (See Comments)    Headaches   Xylocaine [Lidocaine Hcl]  With epinephrine, given by dentist.  Speeded up heart rate and she passed out (occured twice, at dentist)   Xylocaine [Lidocaine]     Other reaction(s): Unknown   Biaxin [Clarithromycin] Rash    Started after completing 10 day course of 2000 mg /day, Lips swelling   Ibuprofen Other (See Comments)    Motrin ok with a GI effect. Other reaction(s): rash Motrin okay with a GI effect    Past Medical History:  Diagnosis Date   Bell's palsy 1966   Hx: right side facial droop, resolved per patient 04/02/19   Carotid artery disease ([place]) 2010   on vascular screening;unchanged 2013.(could not tolerate simvastatin, no other statins tried)--<30% blockage bilat 07/2011   Chronic abdominal pain    Chronic fatigue and malaise    Claustrophobia    Depression    treated in the past for years;stopped in 2010 for a years   Duodenal  ulcer 1962   h/o   Dysrhythmia    ocassional PVC's, no current problems per patient on 04/02/19   Fibromyalgia    Frequent PVCs 07/2012   Seen by [facility]: benign, asymptomatic, normal EF   GERD (gastroesophageal reflux disease)    diet controlled   Glaucoma, narrow-angle    s/p laser surgery   History of hiatal hernia    during endoscopy   Hypothyroid 9/08   IBS (irritable bowel syndrome)    Dr. Benson [place]   Ischemic colitis ([place]) 11/21/2018   no current problems per patient on [id number]   Lichenoid keratosis [id number]   Dr.Stinehelfer   Ocular migraine    Osteoporosis 10/11   Dr.Hawkes   Panic attack    Parkinson disease ([place])    Parkinson's disease ([place]) 06/23/2016   Recurrent UTI    has cystocele-Dr.Grewal   Shingles 1999   h/o   Superficial thrombophlebitis 03/2009   RLE   Trochanteric bursitis 12/2008   bilateral    Family History  Problem Relation Age of Onset   Heart disease Mother    Hypertension Mother    Hypertension Sister    HIV Son    Heart disease Brother    Lung cancer Brother        lung   Diabetes Maternal Grandfather    Diabetes Granddaughter        type 1   Colon cancer Neg Hx    Esophageal cancer Neg Hx    Prostate cancer Neg Hx    Stomach cancer Neg Hx    Rectal cancer Neg Hx     Social History   Socioeconomic History   Marital status: Married    Spouse name: Gwyndolyn Saxon   Number of children: 2   Years of education: Not on file   Highest education level: Not on file  Occupational History   Occupation: retired (school system)  Tobacco Use   Smoking status: Never   Smokeless tobacco: Never  Vaping Use   Vaping Use: Never used  Substance and Sexual Activity   Alcohol use: No    Alcohol/week: 0.0 standard drinks   Drug use: No   Sexual activity: Not Currently    Partners: Male    Birth control/protection: Other-see comments    Comment: Hysterectomy  Other Topics Concern   Not on file  Social History Narrative   Married.  Son  lives in [place]; Daughter [name] lives in [place]; 2 grandchildren   Social Determinants of Health   Financial Resource Strain: Not on file  Food Insecurity: Not on  file  Transportation Needs: Not on file  Physical Activity: Not on file  Stress: Not on file  Social Connections: Not on file  Intimate Partner Violence: Not on file    Past Medical History, Surgical history, Social history, and Family history were reviewed and updated as appropriate.   Please see review of systems for further details on the patient's review from today.   Objective:   Physical Exam:  LMP  (LMP Unknown)   Physical Exam Neurological:     Mental Status: She is alert and oriented to person, place, and time.     Cranial Nerves: No dysarthria.  Psychiatric:        Attention and Perception: Attention and perception normal.        Mood and Affect: Mood normal.        Speech: Speech normal.        Behavior: Behavior is cooperative.        Thought Content: Thought content normal. Thought content is not paranoid or delusional. Thought content does not include homicidal or suicidal ideation. Thought content does not include homicidal or suicidal plan.        Cognition and Memory: Cognition and memory normal.        Judgment: Judgment normal.     Comments: Insight intact    Lab Review:     Component Value Date/Time   NA 142 03/16/2020 1153   NA 141 09/03/2019 1117   K 4.2 03/16/2020 1153   CL 106 03/16/2020 1153   CO2 27 03/16/2020 1153   GLUCOSE 97 03/16/2020 1153   BUN 10 03/16/2020 1153   BUN 14 09/03/2019 1117   CREATININE 0.62 03/16/2020 1153   CREATININE 0.75 10/22/2018 1401   CREATININE 0.67 11/23/2016 0815   CALCIUM 9.6 03/16/2020 1153   PROT 6.8 09/03/2019 1117   ALBUMIN 4.2 09/03/2019 1117   AST 19 09/03/2019 1117   AST 17 10/22/2018 1401   ALT 4 09/03/2019 1117   ALT 7 10/22/2018 1401   ALKPHOS 83 09/03/2019 1117   BILITOT 1.0 09/03/2019 1117   BILITOT 0.8 10/22/2018 1401   GFRNONAA  >60 03/16/2020 1153   GFRNONAA >60 10/22/2018 1401   GFRAA >60 03/16/2020 1153   GFRAA >60 10/22/2018 1401       Component Value Date/Time   WBC 8.7 03/16/2020 1153   RBC 4.85 03/16/2020 1153   HGB 14.3 03/16/2020 1153   HGB 14.2 01/22/2020 1213   HCT 45.5 03/16/2020 1153   HCT 42.5 01/22/2020 1213   PLT 316 03/16/2020 1153   PLT 268 01/22/2020 1213   MCV 93.8 03/16/2020 1153   MCV 91 01/22/2020 1213   MCH 29.5 03/16/2020 1153   MCHC 31.4 03/16/2020 1153   RDW 13.1 03/16/2020 1153   RDW 12.5 01/22/2020 1213   LYMPHSABS 1.8 01/22/2020 1213   MONOABS 0.9 04/08/2019 0516   EOSABS 0.2 01/22/2020 1213   BASOSABS 0.1 01/22/2020 1213    No results found for: POCLITH, LITHIUM   No results found for: PHENYTOIN, PHENOBARB, VALPROATE, CBMZ   .res Assessment: Plan:    Plan:  PDMP reviewed  1. Xanax 0.'5mg'$  - 4 x daily prn anxiety    2  Prozac '10mg'$  daily   RTC 6 weeks  Patient advised to contact office with any questions, adverse effects, or acute worsening in signs and symptoms.  Discussed potential benefits, risk, and side effects of benzodiazepines to include potential risk of tolerance and dependence, as well as possible drowsiness.  Advised patient not to drive if experiencing drowsiness and to take lowest possible effective dose to minimize risk of dependence and tolerance  Diagnoses and all orders for this visit:  Panic attacks  Insomnia, unspecified type  Major depressive disorder, recurrent episode, moderate ([place])  Generalized anxiety disorder   Please see After Visit Summary for patient specific instructions.  Future Appointments  Date Time Provider [place]  02/21/2021  2:30 PM Sater, Nanine Means, MD [facility] None  02/22/2021  3:00 PM Shanon Ace, LCSW [facility] None  03/08/2021  3:00 PM Shanon Ace, LCSW [facility] None  03/10/2021  3:00 PM [name], MD [facility] [place]  03/22/2021  3:00 PM Shanon Ace, LCSW [facility] None    No orders of the defined types were  placed in this encounter.     -------------------------------

## 2021-02-16 ENCOUNTER — Ambulatory Visit: Payer: Medicare Other | Admitting: Family Medicine

## 2021-02-17 DIAGNOSIS — I700 Atherosclerosis of aorta: Secondary | ICD-10-CM | POA: Diagnosis not present

## 2021-02-17 DIAGNOSIS — F411 Generalized anxiety disorder: Secondary | ICD-10-CM | POA: Diagnosis not present

## 2021-02-17 DIAGNOSIS — K559 Vascular disorder of intestine, unspecified: Secondary | ICD-10-CM | POA: Diagnosis not present

## 2021-02-17 DIAGNOSIS — M7062 Trochanteric bursitis, left hip: Secondary | ICD-10-CM | POA: Diagnosis not present

## 2021-02-17 DIAGNOSIS — K581 Irritable bowel syndrome with constipation: Secondary | ICD-10-CM | POA: Diagnosis not present

## 2021-02-17 DIAGNOSIS — M5116 Intervertebral disc disorders with radiculopathy, lumbar region: Secondary | ICD-10-CM | POA: Diagnosis not present

## 2021-02-17 DIAGNOSIS — M810 Age-related osteoporosis without current pathological fracture: Secondary | ICD-10-CM | POA: Diagnosis not present

## 2021-02-17 DIAGNOSIS — M7551 Bursitis of right shoulder: Secondary | ICD-10-CM | POA: Diagnosis not present

## 2021-02-17 DIAGNOSIS — E063 Autoimmune thyroiditis: Secondary | ICD-10-CM | POA: Diagnosis not present

## 2021-02-17 DIAGNOSIS — G20 Parkinson's disease: Secondary | ICD-10-CM | POA: Diagnosis not present

## 2021-02-17 DIAGNOSIS — M7061 Trochanteric bursitis, right hip: Secondary | ICD-10-CM | POA: Diagnosis not present

## 2021-02-17 DIAGNOSIS — E039 Hypothyroidism, unspecified: Secondary | ICD-10-CM | POA: Diagnosis not present

## 2021-02-17 DIAGNOSIS — Z87440 Personal history of urinary (tract) infections: Secondary | ICD-10-CM | POA: Diagnosis not present

## 2021-02-17 DIAGNOSIS — F332 Major depressive disorder, recurrent severe without psychotic features: Secondary | ICD-10-CM | POA: Diagnosis not present

## 2021-02-17 DIAGNOSIS — H409 Unspecified glaucoma: Secondary | ICD-10-CM | POA: Diagnosis not present

## 2021-02-17 DIAGNOSIS — M7542 Impingement syndrome of left shoulder: Secondary | ICD-10-CM | POA: Diagnosis not present

## 2021-02-17 DIAGNOSIS — K219 Gastro-esophageal reflux disease without esophagitis: Secondary | ICD-10-CM | POA: Diagnosis not present

## 2021-02-17 DIAGNOSIS — G8929 Other chronic pain: Secondary | ICD-10-CM | POA: Diagnosis not present

## 2021-02-21 ENCOUNTER — Ambulatory Visit (INDEPENDENT_AMBULATORY_CARE_PROVIDER_SITE_OTHER): Payer: Medicare Other | Admitting: Neurology

## 2021-02-21 ENCOUNTER — Encounter: Payer: Self-pay | Admitting: Neurology

## 2021-02-21 ENCOUNTER — Other Ambulatory Visit: Payer: Self-pay

## 2021-02-21 VITALS — BP 145/71 | HR 70 | Ht 59.0 in | Wt 99.5 lb

## 2021-02-21 DIAGNOSIS — G250 Essential tremor: Secondary | ICD-10-CM

## 2021-02-21 DIAGNOSIS — S32010S Wedge compression fracture of first lumbar vertebra, sequela: Secondary | ICD-10-CM

## 2021-02-21 DIAGNOSIS — G20 Parkinson's disease: Secondary | ICD-10-CM | POA: Diagnosis not present

## 2021-02-21 DIAGNOSIS — F418 Other specified anxiety disorders: Secondary | ICD-10-CM | POA: Diagnosis not present

## 2021-02-21 DIAGNOSIS — K8020 Calculus of gallbladder without cholecystitis without obstruction: Secondary | ICD-10-CM

## 2021-02-21 HISTORY — DX: Calculus of gallbladder without cholecystitis without obstruction: K80.20

## 2021-02-21 MED ORDER — SERTRALINE HCL 25 MG PO TABS: 25.0000 mg | ORAL_TABLET | Freq: Every day | ORAL | 5 refills | Status: DC

## 2021-02-21 MED ORDER — CARBIDOPA-LEVODOPA ER 25-100 MG PO TBCR
EXTENDED_RELEASE_TABLET | ORAL | 3 refills | Status: DC
Start: 2021-02-21 — End: 2021-09-05

## 2021-02-21 NOTE — Patient Instructions (Signed)
Change carbidopa/levodopa to 2 pills in the morning, 1 pill in the afternoon and 2 pills at night    Take on an empty stomach.    If any lightheadedness, go back to 4 pills a day

## 2021-02-21 NOTE — Progress Notes (Signed)
[facility]  PATIENT: Tracy Malone DOB: [birth year]  REFERRING DOCTOR OR PCP:  Joselyn Arrow SOURCE: patient and EMR records  _________________________________   HISTORICAL  CHIEF COMPLAINT:  Chief Complaint  Patient presents with   Follow-up    Rm 2, w/ husband. Here for 6 month f/u, takes sinemet for PD. Pt states she is not doing well. Pt states not feeling well, from taking Prozac, she has now stopped taking but has bad cramping. Pt would like to discuss her carbidopa-levodopa.     HISTORY OF PRESENT ILLNESS:  Tracy Malone is a 87 y.o.  woman with tremor, anxiety/depression and history of L1 fracture.    Update 02/21/2021: She feels mostly stable.  Tremor is present while resting but gets worse when she writes or if she gets upset.. It is also worse when anxious.  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   She is trying Sinemet CR 25/100 every 4 hours while awake (4 pills a day).  On Sinemet, her gait appeared to improve but the tremor did not change much.  Alprazolam 0.5 mg po qAm,   1 mg qHS has helped her tremor some.  She had a lot of benefit taking propranolol for the tremor but she had hypotension and needed to stop.      Her handwriting is poor.   Tremor is at rest and with intention.       She has anxiety.  She is not sure how much alprazolam helps.  Prozac helps some but causes stomach problems and headaches. Escitalopram was poorly tolerated    She reports that sertraline was well tolerates initially but then she started to have some lip swelling .  No rash, hives or shortness of breath.     She sees [facility].     he fractured the L1 vertebrae September 2020 while moving a mattress and had kyphoplasty 04/03/2019.   She has also had shots in her back (nerve blocks and ESI) and PT.  She feels PT helped more than the shots..      She had lumbar surgery (Dr. Shon Baton) recently and continues to have pain (back and leg ito the thigh).     Tremor History:       She first noted a right hand tremor in late 2015.    She notes the tremor in the right hand much more than the left.   It worsened with intention but also present at rest   Tremor is not present when asleep.    She has not noted the tremor in the head.   Initially, she did not have any difficulty with her gait or balance.   There is no family history of tremors.   Alprazolam seemed to help the tremor.   Propranolol helped the tremor quite a bit but was poorly tolerated.   She started to have more gait issues around 2019   Sinemet seemed to help the gait but not the tremor.       REVIEW OF SYSTEMS: Constitutional: No fevers, chills, sweats, or change in appetite Eyes: No visual changes, double vision, eye pain Ear, nose and throat: No hearing loss, ear pain, nasal congestion, sore throat Cardiovascular: No chest pain, palpitations.  Has had palpitations Respiratory:  No shortness of breath at rest or with exertion.   No wheezes GastrointestinaI: No nausea, vomiting, diarrhea, abdominal pain, fecal incontinence Genitourinary:  No dysuria, urinary retention or frequency.  No nocturia. Musculoskeletal:  No neck pain, back  pain.  She notes right shoulder pain Integumentary: No rash, pruritus, skin lesions Neurological: as above Psychiatric: Some crying spells..  Some anxiety Endocrine: No palpitations, diaphoresis, change in appetite, change in weigh or increased thirst Hematologic/Lymphatic:  No anemia, purpura, petechiae. Allergic/Immunologic: No itchy/runny eyes, nasal congestion, recent allergic reactions, rashes  ALLERGIES: Allergies  Allergen Reactions   Iodine Anaphylaxis    IV and topical forms. Other reaction(s): Unknown   Levsin [Hyoscyamine Sulfate]     Vision problems/pt has glaucoma   Salmon [Fish Allergy] Hives and Shortness Of Breath   Shellfish Allergy Anaphylaxis   Tramadol Swelling   Remeron [Mirtazapine] Other (See Comments)    Cause blurred vision and red eyes, pt has  glaucoma   Aspirin Other (See Comments)    Sever stomach pain due to ulcer scaring.   Bis Subcit-Metronid-Tetracyc Swelling    Tongue swelling. Face tingling Other reaction(s): Unknown   Cephalexin Hives    Other reaction(s): hives   Ciprofloxacin Diarrhea   Codeine Nausea And Vomiting   Contrast Media [Iodinated Diagnostic Agents]    Cyclobenzaprine Other (See Comments)    Tingly/prickly sensation. Other reaction(s): tingly/prickly sensation   Darvocet [Propoxyphene N-Acetaminophen] Nausea And Vomiting   Demerol [Meperidine] Nausea Only   Dexlansoprazole Swelling    Redness, swelling and peeling of both feet. Other reaction(s): foot pain   Diphedryl [Diphenhydramine] Other (See Comments)    Increased pulse/small amount ok   Doxycycline Hyclate Other (See Comments)    GI intolerance.   Doxycycline Hyclate     Other reaction(s): GI intolerance   Epinephrine Other (See Comments)    Breathing problems Other reaction(s): breathing problems/fainting   Erythromycin Other (See Comments)    GI intolerance. Other reaction(s): GI   Fish Oil     Other reaction(s): breathing problems/hives   Hyoscyamine     Other reaction(s): eye pain   Latex Other (See Comments)    Gloves ok.  Skin gets red from elastic in underwear and latex bandaides.   Meperidine Hcl     Other reaction(s): vomiting   Nitrofurantoin Diarrhea   Other     Other reaction(s): migraines Other reaction(s): Unknown Other reaction(s): Unknown Other reaction(s): Unknown Other reaction(s): increased pulse, faint, diarrhea   Prednisone Other (See Comments)    Headache Other reaction(s): headache   Ra Diphedryl Allergy [Diphenhydramine Hcl]     Other reaction(s): increased pulse small dose okay   Sertraline Hcl Swelling and Other (See Comments)    Migraine Swelling of tongue/lip (09/2012) Other reaction(s): Unknown   Shellfish-Derived Products     Other reaction(s): Unknown   Sulfa Antibiotics Other (See Comments)     Increased pulse, fainting, diarrhea, thrush   Wellbutrin [Bupropion] Other (See Comments)    Headaches   Xylocaine [Lidocaine Hcl]     With epinephrine, given by dentist.  Speeded up heart rate and she passed out (occured twice, at dentist)   Xylocaine [Lidocaine]     Other reaction(s): Unknown   Biaxin [Clarithromycin] Rash    Started after completing 10 day course of 2000 mg /day, Lips swelling   Ibuprofen Other (See Comments)    Motrin ok with a GI effect. Other reaction(s): rash Motrin okay with a GI effect    HOME MEDICATIONS:  Current Outpatient Medications:    acetaminophen (TYLENOL) 650 MG CR tablet, Take 650 mg by mouth every 8 (eight) hours as needed for pain., Disp: , Rfl:    ALPRAZolam (XANAX) 0.5 MG tablet, Take four tablets daily as  needed for anxiety., Disp: 120 tablet, Rfl: 2   b complex vitamins tablet, Take 1 tablet by mouth daily., Disp: , Rfl:    calcium carbonate (OSCAL) 1500 (600 Ca) MG TABS tablet, Take 600 mg by mouth 2 (two) times daily., Disp: , Rfl:    Cholecalciferol (VITAMIN D) 2000 UNITS tablet, Take 2,000 Units by mouth daily., Disp: , Rfl:    dicyclomine (BENTYL) 10 MG capsule, As needed, Disp: , Rfl:    Probiotic Product (ALIGN PO), Take 1 capsule by mouth daily., Disp: , Rfl:    sertraline (ZOLOFT) 25 MG tablet, Take 1 tablet (25 mg total) by mouth daily., Disp: 30 tablet, Rfl: 5   SYNTHROID 25 MCG tablet, TAKE 1 TABLET DAILY BEFORE BREAKFAST FOR HYPOTHYROIDISM, Disp: 90 tablet, Rfl: 0   Carbidopa-Levodopa ER (SINEMET CR) 25-100 MG tablet controlled release, Take 2 po qAm, one po qPM and 2 po qHS, Disp: 450 tablet, Rfl: 3  Current Facility-Administered Medications:    0.9 %  sodium chloride infusion, 500 mL, Intravenous, Once, Rachael Fee, MD  PAST MEDICAL HISTORY: Past Medical History:  Diagnosis Date   Bell's palsy 1966   Hx: right side facial droop, resolved per patient 04/02/19   Carotid artery disease (HCC) 2010   on vascular  screening;unchanged 2013.(could not tolerate simvastatin, no other statins tried)--<30% blockage bilat 07/2011   Chronic abdominal pain    Chronic fatigue and malaise    Claustrophobia    Depression    treated in the past for years;stopped in 2010 for a years   Duodenal ulcer 1962   h/o   Dysrhythmia    ocassional PVC's, no current problems per patient on 04/02/19   Fibromyalgia    Frequent PVCs 07/2012   Seen by [facility]: benign, asymptomatic, normal EF   GERD (gastroesophageal reflux disease)    diet controlled   Glaucoma, narrow-angle    s/p laser surgery   History of hiatal hernia    during endoscopy   Hypothyroid 9/08   IBS (irritable bowel syndrome)    Dr. Elnoria Howard   Ischemic colitis (HCC) 11/21/2018   no current problems per patient on 04/02/19   Lichenoid keratosis 12/03/2020   Dr.Stinehelfer   Ocular migraine    Osteoporosis 10/11   Dr.Hawkes   Panic attack    Parkinson disease (HCC)    Parkinson's disease (HCC) 06/23/2016   Recurrent UTI    has cystocele-Dr.Grewal   Shingles 1999   h/o   Superficial thrombophlebitis 03/2009   RLE   Trochanteric bursitis 12/2008   bilateral    PAST SURGICAL HISTORY: Past Surgical History:  Procedure Laterality Date   ABDOMINAL HYSTERECTOMY     CATARACT EXTRACTION, BILATERAL  1995, 1996   EYE SURGERY Bilateral    laser - glaucoma   Flexible sigmoidoscopy     KYPHOPLASTY N/A 04/03/2019   Procedure: KYPHOPLASTY L1;  Surgeon: Venita Lick, MD;  Location: [facility] OR;  Service: Orthopedics;  Laterality: N/A;  90 mins   THYROIDECTOMY, PARTIAL  09/2005   L nodule; Dr. Gerrit Friends   TONSILLECTOMY  1946   UPPER GI ENDOSCOPY  06/27/12   VAGINAL HYSTERECTOMY  1971   and bladder repair.  Still has ovaries   WISDOM TOOTH EXTRACTION      FAMILY HISTORY: Family History  Problem Relation Age of Onset   Heart disease Mother    Hypertension Mother    Hypertension Sister    HIV Son    Heart disease Brother  Lung cancer Brother        lung    Diabetes Maternal Grandfather    Diabetes Granddaughter        type 1   Colon cancer Neg Hx    Esophageal cancer Neg Hx    Prostate cancer Neg Hx    Stomach cancer Neg Hx    Rectal cancer Neg Hx     SOCIAL HISTORY:  Social History   Socioeconomic History   Marital status: Married    Spouse name: Chrissie Noa   Number of children: 2   Years of education: Not on file   Highest education level: Not on file  Occupational History   Occupation: retired (school system)  Tobacco Use   Smoking status: Never   Smokeless tobacco: Never  Vaping Use   Vaping Use: Never used  Substance and Sexual Activity   Alcohol use: No    Alcohol/week: 0.0 standard drinks   Drug use: No   Sexual activity: Not Currently    Partners: Male    Birth control/protection: Other-see comments    Comment: Hysterectomy  Other Topics Concern   Not on file  Social History Narrative   Married.  Son lives in [place]; Daughter [name] lives in [place]; 2 grandchildren   Social Determinants of Health   Financial Resource Strain: Not on file  Food Insecurity: Not on file  Transportation Needs: Not on file  Physical Activity: Not on file  Stress: Not on file  Social Connections: Not on file  Intimate Partner Violence: Not on file     PHYSICAL EXAM  Vitals:   02/21/21 1407  BP: (!) 145/71  Pulse: 70  Weight: 99 lb 8 oz (45.1 kg)  Height: 4\' 11"  (1.499 m)    Body mass index is 20.1 kg/m.   General: The patient is well-developed and well-nourished and in no acute distress  Musculoskeletal: She has mild tenderness over the right subacromial bursa of the shoulder and also has mild tenderness over the L1 region of her back.    Skin:   No edema.    There were a couple small subcutaneous nodules in the right arm. .    Neurologic Exam  Mental status: The patient is alert and oriented x 3 at the time of the examination. The patient has apparent normal recent and remote memory, with an apparently normal  attention span and concentration ability.   Speech is normal.  Cranial nerves: Extraocular movements are full.   Facial strength is normal.  Trapezius and sternocleidomastoid strength is normal. No dysarthria is noted.    Motor: She does not have bradykinesia.  There is a 6-7 Hz tremor on the right>> left  that is worse during rest and with intention.   Muscle bulk is normal.   Tone is normal. No cogwheeling.  Strength is  5 / 5 in all 4 extremities.   Sensory: Sensory testing is intact to touch and vibration sensation in all 4 extremities.  Coordination: Cerebellar testing reveals good finger-nose-finger bilaterally.  Gait and station: Station is normal.   The gait has a mildly reduced stride, and normal arm swing.   Gait is difficult to test due to her lumbar fracture and back pain.  She was able to turn 180 degrees in 4 steps.  Romberg is negative.   Reflexes: Deep tendon reflexes are symmetric and normal bilaterally.    DIAGNOSTIC DATA (LABS, IMAGING, TESTING) - I reviewed patient records, labs, notes, testing and imaging myself where available.  Lab Results  Component Value Date   WBC 8.7 03/16/2020   HGB 14.3 03/16/2020   HCT 45.5 03/16/2020   MCV 93.8 03/16/2020   PLT 316 03/16/2020      Component Value Date/Time   NA 142 03/16/2020 1153   NA 141 09/03/2019 1117   K 4.2 03/16/2020 1153   CL 106 03/16/2020 1153   CO2 27 03/16/2020 1153   GLUCOSE 97 03/16/2020 1153   BUN 10 03/16/2020 1153   BUN 14 09/03/2019 1117   CREATININE 0.62 03/16/2020 1153   CREATININE 0.75 10/22/2018 1401   CREATININE 0.67 11/23/2016 0815   CALCIUM 9.6 03/16/2020 1153   PROT 6.8 09/03/2019 1117   ALBUMIN 4.2 09/03/2019 1117   AST 19 09/03/2019 1117   AST 17 10/22/2018 1401   ALT 4 09/03/2019 1117   ALT 7 10/22/2018 1401   ALKPHOS 83 09/03/2019 1117   BILITOT 1.0 09/03/2019 1117   BILITOT 0.8 10/22/2018 1401   GFRNONAA >60 03/16/2020 1153   GFRNONAA >60 10/22/2018 1401   GFRAA >60  03/16/2020 1153   GFRAA >60 10/22/2018 1401   Lab Results  Component Value Date   CHOL 233 (H) 08/29/2018   HDL 91 08/29/2018   LDLCALC 116 (H) 08/29/2018   TRIG 128 08/29/2018   CHOLHDL 2.6 08/29/2018   Lab Results  Component Value Date   HGBA1C 5.3 10/07/2020   No results found for: VITAMINB12 Lab Results  Component Value Date   TSH 2.310 01/22/2020       ASSESSMENT AND PLAN    1. Parkinson's disease (HCC)   2. Essential tremor   3. Depression with anxiety   4. Compression fracture of L1 vertebra, sequela      1.    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  continue Sinemet CR 25/100 and increase to 5 pills a day (2 in the morning, 1 in the afternoon and 2 at night).  Continue alprazolam (written by psychiatry) 2.    Continue to use walker for safety 3.    We can change prozac to zoloft.   Had some swelling but no definite allergic reaction in the past. 4.    She will return to see me in 6 months or as needed if there are new or worsening neurologic symptoms.   Danaka Llera A. Epimenio Foot, MD, PhD 02/21/2021, 3:38 PM Certified in Neurology, Clinical Neurophysiology, Sleep Medicine, Pain Medicine and Neuroimaging  [facility] [address], [address] [place], [place] [address] [phone]

## 2021-02-22 ENCOUNTER — Ambulatory Visit (INDEPENDENT_AMBULATORY_CARE_PROVIDER_SITE_OTHER): Payer: Medicare Other | Admitting: Psychiatry

## 2021-02-22 ENCOUNTER — Telehealth: Payer: Self-pay | Admitting: Adult Health

## 2021-02-22 DIAGNOSIS — M7062 Trochanteric bursitis, left hip: Secondary | ICD-10-CM | POA: Diagnosis not present

## 2021-02-22 DIAGNOSIS — M7061 Trochanteric bursitis, right hip: Secondary | ICD-10-CM | POA: Diagnosis not present

## 2021-02-22 DIAGNOSIS — M7551 Bursitis of right shoulder: Secondary | ICD-10-CM | POA: Diagnosis not present

## 2021-02-22 DIAGNOSIS — M5116 Intervertebral disc disorders with radiculopathy, lumbar region: Secondary | ICD-10-CM | POA: Diagnosis not present

## 2021-02-22 DIAGNOSIS — G20 Parkinson's disease: Secondary | ICD-10-CM | POA: Diagnosis not present

## 2021-02-22 DIAGNOSIS — F411 Generalized anxiety disorder: Secondary | ICD-10-CM

## 2021-02-22 NOTE — Telephone Encounter (Signed)
Pt called and said that she stopped the prozac after  five days. On day 3 she started experiencing stomach pain and burning, She couldn't eat or sleep. As soon as she stopped the medicine the stomach pain went away. Please call her with any new medicines at [phone]

## 2021-02-22 NOTE — Telephone Encounter (Signed)
Please review

## 2021-02-22 NOTE — Progress Notes (Signed)
[facility] Counselor/Therapist Progress Note  Patient ID: Tracy Malone, MRN: [id number],    Date: 02/22/2021  Time Spent: 30 minutes       Virtual Visit via Telephone Note: (Telephone Only) Connected with patient by a video enabled telemedicine/telehealth application or telephone, with their informed consent, and verified patient privacy and that I am speaking with the correct person using two identifiers. I discussed the limitations, risks, security and privacy concerns of performing psychotherapy and management service by telephone and the availability of in person appointments. I also discussed with the patient that there may be a patient responsible charge related to this service. The patient expressed understanding and agreed to proceed. I discussed the treatment planning with the patient. The patient was provided an opportunity to ask questions and all were answered. The patient agreed with the plan and demonstrated an understanding of the instructions. The patient was advised to call  our office if  symptoms worsen or feel they are in a crisis state and need immediate contact.   Therapist Location: [facility] Patient Location: home   Treatment Type: Individual Therapy  Reported Symptoms: anxiety, depression, "drained" and tired  Mental Status Exam:  Appearance:   N/A   telehealth      Behavior:  Sharing  Motor:  N/A   telehealth  Speech/Language:   Clear and Coherent and Normal Rate  Affect:  N/A    telehealth  Mood:  anxious and depressed  Thought process:  Some tangentiality  Thought content:    Some tangentiality  Sensory/Perceptual disturbances:    WNL  Orientation:  oriented to person, place, time/date, situation, day of week, month of year, year, and stated date of Aug. 2, 2022  Attention:  Good  Concentration:  Good and Fair  Memory:  WNL  Fund of knowledge:   Good  Insight:    Good and Fair  Judgment:   Good  Impulse Control:  Good   Risk  Assessment: Danger to Self:  No Self-injurious Behavior: No Danger to Others: No Duty to Warn:no Physical Aggression / Violence:No  Access to Firearms a concern: No  Gang Involvement:No   Subjective:  Patient today reporting anxiety, depression, and feeling "drained and tired". Processing multiple "worries" about herself and her husband who both are having health concerns and have not been able to get out this past week.  Has had to get some in-home medical assistance for her and her husband which was helpful for them.  Anxious and depressed and was able to talk very openly including their health issues and her emotional health issues.  Did a good job as noted above in processing her "worries" and seemed to be able to accept support and reduce her worrying.  She denies any SI.  It was noticeable that she did not really focus heavily on her incarcerated son today which is usually a very sensitive issue for her and hard for her not to get stuck in.  We focused some on some positives in that she has family close by and concerned neighbors, and "found a good agency that helps provide medical people to come help you in your home".  She was also to recall how on some occasions she has more positively managed stressors and has used positive affirmations and her faith to help her on some occasions.  Husband (94) remains very supportive but he too is having significant medical problems.  Patient sounded like she had some more energy and was  more upbeat by end of session.   Interventions: Cognitive Behavioral Therapy and Solution-Oriented/Positive Psychology  Diagnosis:   ICD-10-CM   1. Generalized anxiety disorder  F41.1        Treatment Goal Plan:  Patient not signing treatment plan on computer screen due to [place]. Treatment Goals: Treatment goals will remain on treatment plan as patient works with strategies to achieve her goals.  Progress will be noted each session and documented in the "progress"  section of note. Long term goal: Develop the ability to recognize, accept, and cope with feelings of depression. Short term goal: Verbalize any unresolved grief issues that may be contributing to depression. Strategy: Replace negative self-defeating self talk with verbalization of realistic and positive cognitive messages to lessen depression and improve mood.     Plan:  Patient today showing less motivation initially but as session continued she seemed stronger and some more motivated.  Did well in talking through some of her frequent "worries" and eventually able to realize some were not quite realistic, but also did gain some clarity and support in talking through them.  Seemed to be somewhat stronger, more hopeful, and a little more energy by the end of session.  Denies any SI.  Encouraged her to practice some behaviors that have proven to be helpful to her before including allowing for good sleep patterns, healthy nutrition, moving about carefully as she is able per Dr., Positive self talk, staying in touch with family and friends who are supportive, staying in the present focusing on what she can control, getting outside when she is able, be open to interacting more with neighbors as she is able, remain on her medications as prescribed, and recognize the strengths she is showing in working with goal-directed behaviors in and out of sessions as she tries to move forward in a more positive direction of improved emotional health and stability.  Goal review and progress/challenges noted with patient.  Next appointment within 2 to 3 weeks.   This record has been created using [employer].  Chart creation errors have been sought, but may not always have been located and corrected.  Such creation errors do not reflect on the standard of medical care.   Shanon Ace, LCSW

## 2021-02-22 NOTE — Telephone Encounter (Signed)
She's done well with the Prozac. Has she been sick with any GI issues.

## 2021-02-23 NOTE — Telephone Encounter (Signed)
Yes I have been trying to call her and she will not answer but Ill keep trying

## 2021-02-23 NOTE — Telephone Encounter (Signed)
Noted  

## 2021-02-23 NOTE — Telephone Encounter (Signed)
No I believe laura meant to say it is not working.She is having stomach problems still

## 2021-02-23 NOTE — Telephone Encounter (Signed)
So she is doing better now?

## 2021-02-23 NOTE — Telephone Encounter (Signed)
Will you call her to discuss.

## 2021-02-23 NOTE — Telephone Encounter (Signed)
Next visit is 03/15/21. Tracy Malone has taken Prozac for 5 days and it made her stomach hurt the entire time and she couldn't eat because of the Prozac. She has been on this before and it is now working. Her phone number is [phone]. [facility] is:  [facility], [place], [place], [place]. Phone number is [phone].

## 2021-02-23 NOTE — Telephone Encounter (Signed)
Rtc to pt and discussed options, she wants to retry the Sertraline 25 mg, she will cut it in 1/2 to see if she can tolerate that with no reaction. She has such medication sensitivity there are not a lot of options. She will call back with an update. She is desperate to take something to help her depression and crying.

## 2021-02-23 NOTE — Telephone Encounter (Signed)
Pt stated she stopped the prozac due to the terrible Gi symptoms.She stated she tried prozac before and it gave her trouble.Now she is having some severe depression and can't stop crying.She wants to know what can be done to help

## 2021-02-23 NOTE — Telephone Encounter (Signed)
Can you call and talk to her about some suggestions.

## 2021-02-23 NOTE — Telephone Encounter (Signed)
Yes please see laura's message she called back today

## 2021-02-24 ENCOUNTER — Other Ambulatory Visit: Payer: Self-pay

## 2021-02-24 DIAGNOSIS — F411 Generalized anxiety disorder: Secondary | ICD-10-CM | POA: Diagnosis not present

## 2021-02-24 DIAGNOSIS — M7061 Trochanteric bursitis, right hip: Secondary | ICD-10-CM | POA: Diagnosis not present

## 2021-02-24 DIAGNOSIS — M5116 Intervertebral disc disorders with radiculopathy, lumbar region: Secondary | ICD-10-CM | POA: Diagnosis not present

## 2021-02-24 DIAGNOSIS — M7551 Bursitis of right shoulder: Secondary | ICD-10-CM | POA: Diagnosis not present

## 2021-02-24 DIAGNOSIS — G20 Parkinson's disease: Secondary | ICD-10-CM | POA: Diagnosis not present

## 2021-02-24 DIAGNOSIS — M7062 Trochanteric bursitis, left hip: Secondary | ICD-10-CM | POA: Diagnosis not present

## 2021-02-24 MED ORDER — SERTRALINE HCL 25 MG PO TABS
ORAL_TABLET | ORAL | 1 refills | Status: DC
Start: 2021-02-24 — End: 2021-03-10

## 2021-02-24 NOTE — Telephone Encounter (Signed)
Lindzie called this morning and said that [facility] hasnt gotten the prescription for her medication yet. Can this be resent to them?   [facility] [id number] [place], [place]  Phone:  [phone]  Fax:  [phone]

## 2021-02-24 NOTE — Telephone Encounter (Signed)
Rx for Sertraline 25 mg take 1/2 tablet by mouth daily sent to [facility]

## 2021-02-24 NOTE — Telephone Encounter (Signed)
She told me she had some at home already. I will contact her this morning to clarify.

## 2021-02-28 ENCOUNTER — Ambulatory Visit
Admission: RE | Admit: 2021-02-28 | Discharge: 2021-02-28 | Disposition: A | Discharge status: Home / Self Care | Payer: Medicare Other | Source: Ambulatory Visit | Attending: Chiropractic Medicine | Admitting: Chiropractic Medicine

## 2021-02-28 ENCOUNTER — Other Ambulatory Visit: Payer: Self-pay

## 2021-02-28 ENCOUNTER — Telehealth: Payer: Self-pay | Admitting: Nurse Practitioner

## 2021-02-28 DIAGNOSIS — M545 Low back pain, unspecified: Secondary | ICD-10-CM | POA: Diagnosis not present

## 2021-02-28 DIAGNOSIS — M5459 Other low back pain: Secondary | ICD-10-CM

## 2021-02-28 NOTE — Telephone Encounter (Signed)
Spoke with patient. Patient reports having nausea, diarrhea,feels her stomach is"swollen. Patient also reported is having sweats and feels clammy at night. States she has been eating bland foods and has taken Mylanta and antacids with no resolve. Patient states her liquid intake is normal and causes no issues. Patient also states she having MRI this afternoon d/t back pain. Please advise, thank you

## 2021-02-28 NOTE — Telephone Encounter (Signed)
Pt sent the following message to the appointment pool:  "Left abdominal pain (stomach area) diarrhea, no appetite ,nausea . Pain  little over 2 weeks.  Need help. Thank you Tracy Malone [id number]  home phone Thank you. Birthday. Jun 17, 1932"

## 2021-03-01 DIAGNOSIS — M7551 Bursitis of right shoulder: Secondary | ICD-10-CM | POA: Diagnosis not present

## 2021-03-01 DIAGNOSIS — M7061 Trochanteric bursitis, right hip: Secondary | ICD-10-CM | POA: Diagnosis not present

## 2021-03-01 DIAGNOSIS — F411 Generalized anxiety disorder: Secondary | ICD-10-CM | POA: Diagnosis not present

## 2021-03-01 DIAGNOSIS — G20 Parkinson's disease: Secondary | ICD-10-CM | POA: Diagnosis not present

## 2021-03-01 DIAGNOSIS — M7062 Trochanteric bursitis, left hip: Secondary | ICD-10-CM | POA: Diagnosis not present

## 2021-03-01 DIAGNOSIS — M5116 Intervertebral disc disorders with radiculopathy, lumbar region: Secondary | ICD-10-CM | POA: Diagnosis not present

## 2021-03-02 ENCOUNTER — Encounter: Payer: Self-pay | Admitting: Family Medicine

## 2021-03-02 ENCOUNTER — Ambulatory Visit (INDEPENDENT_AMBULATORY_CARE_PROVIDER_SITE_OTHER): Payer: Medicare Other | Admitting: Family Medicine

## 2021-03-02 ENCOUNTER — Other Ambulatory Visit: Payer: Self-pay

## 2021-03-02 VITALS — BP 112/70 | HR 68 | Temp 98.0°F | Ht 59.0 in | Wt 99.6 lb

## 2021-03-02 DIAGNOSIS — R0789 Other chest pain: Secondary | ICD-10-CM

## 2021-03-02 DIAGNOSIS — M545 Low back pain, unspecified: Secondary | ICD-10-CM

## 2021-03-02 DIAGNOSIS — F339 Major depressive disorder, recurrent, unspecified: Secondary | ICD-10-CM

## 2021-03-02 DIAGNOSIS — R197 Diarrhea, unspecified: Secondary | ICD-10-CM | POA: Diagnosis not present

## 2021-03-02 DIAGNOSIS — G8929 Other chronic pain: Secondary | ICD-10-CM

## 2021-03-02 DIAGNOSIS — R1012 Left upper quadrant pain: Secondary | ICD-10-CM

## 2021-03-02 NOTE — Progress Notes (Signed)
Chief Complaint  Patient presents with   Abdominal Pain    Abdominal pain on left side x several months. Home physical therapist took her bp Monday and Tuesday she was told it was very low. Having low back pain, more intense than usual. Has had diarrhea for three days, but not today. Has been hto burning pain on left side of her breast. Hair falling out.    Appointment was scheduled for "possible UTI"  She denies any dysuria, urgency, frequency, incontinence or any urinary complaints.  She is unable to give a sample today.  She had diarrhea x 3 days--Fri/Sat/Sun.  No further diarrhea the last 3 days, had a normal bowel movement today.  She had some nausea the first 2 days, no vomiting.  Nausea has resolved, but only eats small amounts.  She continues to have some gassiness. She states her stomach feels "swollen", pointing to the left side (which she has complained of for years).  She is still having back pain. Laying down, ice pack and [facility] do help some. MRI was done 8/8 (ordered by specialist)--  IMPRESSION: 1. 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 No explanation for extremity symptoms. 2. Scoliosis. 3. Remote and healed L1 compression fracture.  Report also notes L5-S1:Disc narrowing and mild bulging. Mild-to-moderate facet spurring.  In the last couple of weeks she gets a "hot burning" feeling in the left breast, extending to the L axilla. It comes and goes, not constant.  Not related to activity.  Denies any change in activity. She had discomfort when she woke up this morning, denies any discomfort currently. She admits she is past due on mammogram--was due around the time of her fracture, pain, and her GYN suggested she hold off.  She has been having some heartburn at night, and uses mylanta at bedtime with good results. Doesn't have spicy/acidic/tomatoes/alcohol or caffeine.   PMH, PSH, SH reviewed  Outpatient Encounter Medications as of 03/02/2021   Medication Sig   acetaminophen (TYLENOL) 650 MG CR tablet Take 650 mg by mouth every 8 (eight) hours as needed for pain.   ALPRAZolam (XANAX) 0.5 MG tablet Take four tablets daily as needed for anxiety.   b complex vitamins tablet Take 1 tablet by mouth daily.   Calcium Carbonate (CALCIUM 600 PO) Take 2 capsules by mouth in the morning and at bedtime.   Carbidopa-Levodopa ER (SINEMET CR) 25-100 MG tablet controlled release Take 2 po qAm, one po qPM and 2 po qHS   Cholecalciferol (VITAMIN D) 2000 UNITS tablet Take 2,000 Units by mouth daily.   Probiotic Product (ALIGN PO) Take 1 capsule by mouth daily.   SYNTHROID 25 MCG tablet TAKE 1 TABLET DAILY BEFORE BREAKFAST FOR HYPOTHYROIDISM   dicyclomine (BENTYL) 10 MG capsule As needed (Patient not taking: Reported on 03/02/2021)   sertraline (ZOLOFT) 25 MG tablet Take 1/2 tablet (12.5 mg) by mouth daily (Patient not taking: Reported on 03/02/2021)   [DISCONTINUED] calcium carbonate (OSCAL) 1500 (600 Ca) MG TABS tablet Take 600 mg by mouth 2 (two) times daily.   Facility-Administered Encounter Medications as of 03/02/2021  Medication   0.9 %  sodium chloride infusion   Allergies  Allergen Reactions   Iodine Anaphylaxis    IV and topical forms. Other reaction(s): Unknown   Levsin [Hyoscyamine Sulfate]     Vision problems/pt has glaucoma   Salmon [Fish Allergy] Hives and Shortness Of Breath   Shellfish Allergy Anaphylaxis   Tramadol Swelling   Remeron [Mirtazapine] Other (See Comments)  Cause blurred vision and red eyes, pt has glaucoma   Aspirin Other (See Comments)    Sever stomach pain due to ulcer scaring.   Bis Subcit-Metronid-Tetracyc Swelling    Tongue swelling. Face tingling Other reaction(s): Unknown   Cephalexin Hives    Other reaction(s): hives   Ciprofloxacin Diarrhea   Codeine Nausea And Vomiting   Contrast Media [Iodinated Diagnostic Agents]    Cyclobenzaprine Other (See Comments)    Tingly/prickly sensation. Other  reaction(s): tingly/prickly sensation   Darvocet [Propoxyphene N-Acetaminophen] Nausea And Vomiting   Demerol [Meperidine] Nausea Only   Dexlansoprazole Swelling    Redness, swelling and peeling of both feet. Other reaction(s): foot pain   Diphedryl [Diphenhydramine] Other (See Comments)    Increased pulse/small amount ok   Doxycycline Hyclate Other (See Comments)    GI intolerance.   Doxycycline Hyclate     Other reaction(s): GI intolerance   Epinephrine Other (See Comments)    Breathing problems Other reaction(s): breathing problems/fainting   Erythromycin Other (See Comments)    GI intolerance. Other reaction(s): GI   Fish Oil     Other reaction(s): breathing problems/hives   Hyoscyamine     Other reaction(s): eye pain   Latex Other (See Comments)    Gloves ok.  Skin gets red from elastic in underwear and latex bandaides.   Meperidine Hcl     Other reaction(s): vomiting   Nitrofurantoin Diarrhea   Other     Other reaction(s): migraines Other reaction(s): Unknown Other reaction(s): Unknown Other reaction(s): Unknown Other reaction(s): increased pulse, faint, diarrhea   Prednisone Other (See Comments)    Headache Other reaction(s): headache   Ra Diphedryl Allergy [Diphenhydramine Hcl]     Other reaction(s): increased pulse small dose okay   Sertraline Hcl Swelling and Other (See Comments)    Migraine Swelling of tongue/lip (09/2012) Other reaction(s): Unknown   Shellfish-Derived Products     Other reaction(s): Unknown   Sulfa Antibiotics Other (See Comments)    Increased pulse, fainting, diarrhea, thrush   Wellbutrin [Bupropion] Other (See Comments)    Headaches   Xylocaine [Lidocaine Hcl]     With epinephrine, given by dentist.  Speeded up heart rate and she passed out (occured twice, at dentist)   Xylocaine [Lidocaine]     Other reaction(s): Unknown   Biaxin [Clarithromycin] Rash    Started after completing 10 day course of 2000 mg /day, Lips swelling    Ibuprofen Other (See Comments)    Motrin ok with a GI effect. Other reaction(s): rash Motrin okay with a GI effect    ROS:  Denies fever, chills, headaches or dizziness. Nausea and diarrhea resolved, +gassiness and bloating per HPI. No rashes, bleeding. No urinary complaints. L breast pain per HPI.  Denies chest pain or palpitations, no shortness of breath. Occasional heartburn per HPI. +depression, anxiety, "I'm just tired"    PHYSICAL EXAM:  BP 112/70   Pulse 68   Temp 98 F (36.7 C) (Tympanic)   Ht '4\' 11"'$  (1.499 m)   Wt 99 lb 9.6 oz (45.2 kg)   LMP  (LMP Unknown)   BMI 20.12 kg/m   Wt Readings from Last 3 Encounters:  03/02/21 99 lb 9.6 oz (45.2 kg)  02/21/21 99 lb 8 oz (45.1 kg)  10/07/20 98 lb 6.4 oz (44.6 kg)   Frail, elderly female, appears depressed, in no distress.  Moves slowly, discomfort with position changes (reclining) related to chronic back pain HEENT: conjunctiva and sclera are clear, EOMI, wearing mask  Neck: no lymphadenopathy, thyromegaly or mass Heart: regular rate and rhythm Lungs: clear bilaterally Back: L SI joint tender. No spinal or CVA tenderness. No muscle spasm Abdomen: normal bowel sounds, soft. Tender in LUQ, no rebound, guarding or mass Chest/breast: Mild fibroglandular changes in L breast, slightly tender in these focal areas. Most of her tenderness is at the lateral chest wall, along the ribs Extremities: no edema Neuro: alert and oriented, normal gait. Psych: depressed mood/affect. Normal eye contact, speech, hygiene and grooming   ASSESSMENT/PLAN:  LUQ pain - pt has had pain/bloating on L side fairly chronically, now in setting of recent diarrhea. Benign exam. trial simethicone. Cont bland, lactose-free diet  Chest wall pain - tender over lateral ribs, and some mild tenderness and FC changes in breast. Discussed mammos given age, optional  Diarrhea, unspecified type - resolved, reassured. May take more time for any abdominal  discomfort to fully resolve  Chronic bilateral low back pain without sciatica - under care of specialist.  MRI reviewed.  Currently tender at L SI joint, d/w pt (unsure if she has had injections there recently or not), cont conservative tx  Depression, recurrent ([place]) - Not well controlled, under care of psych.  Encouraged her to keep her mind focused on other things rather than pain, counseled

## 2021-03-02 NOTE — Patient Instructions (Signed)
Your chest pain is related to sore ribs (maybe a tiny area of discomfort within the breast itself, but not concerning). If you have persistent pain in the actual breast (not ribs), consider getting a mammogram.  You may try Simethicone (Gas-X) for any bloating and gas pains.  Try and focus on things other than pain.

## 2021-03-03 ENCOUNTER — Encounter: Payer: Self-pay | Admitting: Family Medicine

## 2021-03-03 DIAGNOSIS — G20 Parkinson's disease: Secondary | ICD-10-CM | POA: Diagnosis not present

## 2021-03-03 DIAGNOSIS — F411 Generalized anxiety disorder: Secondary | ICD-10-CM | POA: Diagnosis not present

## 2021-03-03 DIAGNOSIS — M5116 Intervertebral disc disorders with radiculopathy, lumbar region: Secondary | ICD-10-CM | POA: Diagnosis not present

## 2021-03-03 DIAGNOSIS — M7551 Bursitis of right shoulder: Secondary | ICD-10-CM | POA: Diagnosis not present

## 2021-03-03 DIAGNOSIS — M7061 Trochanteric bursitis, right hip: Secondary | ICD-10-CM | POA: Diagnosis not present

## 2021-03-03 DIAGNOSIS — M7062 Trochanteric bursitis, left hip: Secondary | ICD-10-CM | POA: Diagnosis not present

## 2021-03-07 ENCOUNTER — Encounter (HOSPITAL_BASED_OUTPATIENT_CLINIC_OR_DEPARTMENT_OTHER): Payer: Self-pay | Admitting: *Deleted

## 2021-03-07 ENCOUNTER — Emergency Department (HOSPITAL_BASED_OUTPATIENT_CLINIC_OR_DEPARTMENT_OTHER): Payer: Medicare Other

## 2021-03-07 ENCOUNTER — Other Ambulatory Visit: Payer: Self-pay

## 2021-03-07 ENCOUNTER — Emergency Department (HOSPITAL_BASED_OUTPATIENT_CLINIC_OR_DEPARTMENT_OTHER)
Admission: EM | Admit: 2021-03-07 | Discharge: 2021-03-07 | Disposition: A | Discharge status: Home / Self Care | Payer: Medicare Other | Attending: Emergency Medicine | Admitting: Emergency Medicine

## 2021-03-07 ENCOUNTER — Emergency Department (HOSPITAL_BASED_OUTPATIENT_CLINIC_OR_DEPARTMENT_OTHER): Payer: Medicare Other | Admitting: Radiology

## 2021-03-07 DIAGNOSIS — R109 Unspecified abdominal pain: Secondary | ICD-10-CM | POA: Diagnosis present

## 2021-03-07 DIAGNOSIS — Z20822 Contact with and (suspected) exposure to covid-19: Secondary | ICD-10-CM | POA: Diagnosis not present

## 2021-03-07 DIAGNOSIS — R519 Headache, unspecified: Secondary | ICD-10-CM | POA: Diagnosis not present

## 2021-03-07 DIAGNOSIS — R1084 Generalized abdominal pain: Secondary | ICD-10-CM | POA: Diagnosis not present

## 2021-03-07 DIAGNOSIS — R1011 Right upper quadrant pain: Secondary | ICD-10-CM | POA: Diagnosis not present

## 2021-03-07 DIAGNOSIS — E039 Hypothyroidism, unspecified: Secondary | ICD-10-CM | POA: Diagnosis not present

## 2021-03-07 DIAGNOSIS — R079 Chest pain, unspecified: Secondary | ICD-10-CM

## 2021-03-07 DIAGNOSIS — K8020 Calculus of gallbladder without cholecystitis without obstruction: Secondary | ICD-10-CM

## 2021-03-07 DIAGNOSIS — G20 Parkinson's disease: Secondary | ICD-10-CM | POA: Diagnosis not present

## 2021-03-07 DIAGNOSIS — K219 Gastro-esophageal reflux disease without esophagitis: Secondary | ICD-10-CM | POA: Diagnosis not present

## 2021-03-07 DIAGNOSIS — Z79899 Other long term (current) drug therapy: Secondary | ICD-10-CM | POA: Diagnosis not present

## 2021-03-07 DIAGNOSIS — R9431 Abnormal electrocardiogram [ECG] [EKG]: Secondary | ICD-10-CM | POA: Diagnosis not present

## 2021-03-07 DIAGNOSIS — R509 Fever, unspecified: Secondary | ICD-10-CM | POA: Diagnosis not present

## 2021-03-07 DIAGNOSIS — N83291 Other ovarian cyst, right side: Secondary | ICD-10-CM | POA: Diagnosis not present

## 2021-03-07 LAB — CBC WITH DIFFERENTIAL/PLATELET
Abs Immature Granulocytes: 0.04 10*3/uL (ref 0.00–0.07)
Basophils Absolute: 0.1 10*3/uL (ref 0.0–0.1)
Basophils Relative: 0 %
Eosinophils Absolute: 0.1 10*3/uL (ref 0.0–0.5)
Eosinophils Relative: 1 %
HCT: 41.8 % (ref 36.0–46.0)
Hemoglobin: 13.9 g/dL (ref 12.0–15.0)
Immature Granulocytes: 0 %
Lymphocytes Relative: 13 %
Lymphs Abs: 1.6 10*3/uL (ref 0.7–4.0)
MCH: 30.4 pg (ref 26.0–34.0)
MCHC: 33.3 g/dL (ref 30.0–36.0)
MCV: 91.5 fL (ref 80.0–100.0)
Monocytes Absolute: 1.6 10*3/uL — ABNORMAL HIGH (ref 0.1–1.0)
Monocytes Relative: 13 %
Neutro Abs: 9.1 10*3/uL — ABNORMAL HIGH (ref 1.7–7.7)
Neutrophils Relative %: 73 %
Platelets: 253 10*3/uL (ref 150–400)
RBC: 4.57 MIL/uL (ref 3.87–5.11)
RDW: 13.2 % (ref 11.5–15.5)
WBC: 12.5 10*3/uL — ABNORMAL HIGH (ref 4.0–10.5)
nRBC: 0 % (ref 0.0–0.2)

## 2021-03-07 LAB — COMPREHENSIVE METABOLIC PANEL
ALT: 175 U/L — ABNORMAL HIGH (ref 0–44)
AST: 135 U/L — ABNORMAL HIGH (ref 15–41)
Albumin: 3.9 g/dL (ref 3.5–5.0)
Alkaline Phosphatase: 81 U/L (ref 38–126)
Anion gap: 9 (ref 5–15)
BUN: 13 mg/dL (ref 8–23)
CO2: 29 mmol/L (ref 22–32)
Calcium: 9.7 mg/dL (ref 8.9–10.3)
Chloride: 101 mmol/L (ref 98–111)
Creatinine, Ser: 0.62 mg/dL (ref 0.44–1.00)
GFR, Estimated: >60 mL/min (ref >60)
Glucose, Bld: 99 mg/dL (ref 70–99)
Potassium: 3.8 mmol/L (ref 3.5–5.1)
Sodium: 139 mmol/L (ref 135–145)
Total Bilirubin: 1.4 mg/dL — ABNORMAL HIGH (ref 0.3–1.2)
Total Protein: 7.1 g/dL (ref 6.5–8.1)

## 2021-03-07 LAB — LIPASE, BLOOD: Lipase: 38 U/L (ref 11–51)

## 2021-03-07 LAB — URINALYSIS, ROUTINE W REFLEX MICROSCOPIC
Bilirubin Urine: NEGATIVE
Glucose, UA: NEGATIVE mg/dL
Nitrite: NEGATIVE
Protein, ur: NEGATIVE mg/dL
Specific Gravity, Urine: 1.01 (ref 1.005–1.030)
pH: 5.5 (ref 5.0–8.0)

## 2021-03-07 LAB — RESP PANEL BY RT-PCR (FLU A&B, COVID) ARPGX2
Influenza A by PCR: NEGATIVE
Influenza B by PCR: NEGATIVE
SARS Coronavirus 2 by RT PCR: NEGATIVE

## 2021-03-07 LAB — TROPONIN I (HIGH SENSITIVITY): Troponin I (High Sensitivity): 6 ng/L (ref <18)

## 2021-03-07 MED ORDER — ONDANSETRON 4 MG PO TBDP: 4.0000 mg | ORAL_TABLET | Freq: Three times a day (TID) | ORAL | 0 refills | Status: DC | PRN

## 2021-03-07 MED ORDER — SODIUM CHLORIDE 0.9 % IV BOLUS
500.0000 mL | Freq: Once | INTRAVENOUS | Status: AC
  Administered 2021-03-07: 500 mL via INTRAVENOUS

## 2021-03-07 MED ORDER — FENTANYL CITRATE (PF) 100 MCG/2ML IJ SOLN
50.0000 ug | Freq: Once | INTRAMUSCULAR | Status: AC
Start: 2021-03-07 — End: 2021-03-07
  Administered 2021-03-07: 50 ug via INTRAVENOUS
  Filled 2021-03-07: qty 2

## 2021-03-07 MED ORDER — FENTANYL CITRATE (PF) 100 MCG/2ML IJ SOLN
50.0000 ug | Freq: Once | INTRAMUSCULAR | Status: AC
  Administered 2021-03-07: 50 ug via INTRAVENOUS
  Filled 2021-03-07: qty 2

## 2021-03-07 MED ORDER — ONDANSETRON HCL 4 MG/2ML IJ SOLN
4.0000 mg | Freq: Once | INTRAMUSCULAR | Status: AC
  Administered 2021-03-07: 4 mg via INTRAVENOUS
  Filled 2021-03-07: qty 2

## 2021-03-07 NOTE — Discharge Instructions (Addendum)
Recommend following up with both your primary care doctor and with the general surgery clinic.  You should have your blood work including your electrolytes, kidney function and liver function tests repeated sometime within the next few days.  Can take the prescribed Zofran as needed for pain and nausea.  If you do have recurrent or worsening abdominal pain, any vomiting or fever, you should return to the emergency room for reassessment at that time.

## 2021-03-07 NOTE — ED Provider Notes (Signed)
[facility] Provider Note   CSN: [id number] Arrival date & time: 03/07/21  0736     History Chief Complaint  Patient presents with   Abdominal Pain   Fever   Headache   Generalized Body Aches    Tracy Malone is a 87 y.o. female.  Presents to ER with with abdominal pain.  States that a couple days ago she noted some abdominal pain.  Described as crampy, aching.  However this has become more severe and more constant.  Left-sided.  Tried Gas-X with minimal relief.  Yesterday also had a headache however this morning did not have any sort of headache.  Some nausea but no vomiting.  Had chills.  Temperature at home of 100 F but nothing higher.  Has Parkinson's disease.  HPI     Past Medical History:  Diagnosis Date   Bell's palsy 1966   Hx: right side facial droop, resolved per patient 04/02/19   Carotid artery disease ([place]) 2010   on vascular screening;unchanged 2013.(could not tolerate simvastatin, no other statins tried)--<30% blockage bilat 07/2011   Chronic abdominal pain    Chronic fatigue and malaise    Claustrophobia    Depression    treated in the past for years;stopped in 2010 for a years   Duodenal ulcer 1962   h/o   Dysrhythmia    ocassional PVC's, no current problems per patient on 04/02/19   Fibromyalgia    Frequent PVCs 07/2012   Seen by [facility]: benign, asymptomatic, normal EF   GERD (gastroesophageal reflux disease)    diet controlled   Glaucoma, narrow-angle    s/p laser surgery   History of hiatal hernia    during endoscopy   Hypothyroid 9/08   IBS (irritable bowel syndrome)    Dr. Benson Norway   Ischemic colitis ([place]) 11/21/2018   no current problems per patient on 09/26/34   Lichenoid keratosis 14/43/1540   Dr.Stinehelfer   Ocular migraine    Osteoporosis 10/11   Dr.Hawkes   Panic attack    Parkinson disease ([place])    Parkinson's disease ([place]) 06/23/2016   Recurrent UTI    has cystocele-Dr.Grewal   Shingles 1999    h/o   Superficial thrombophlebitis 03/2009   RLE   Trochanteric bursitis 12/2008   bilateral    Patient Active Problem List   Diagnosis Date Noted   LUQ pain 10/29/2019   Poor appetite 10/29/2019   Loss of weight 10/29/2019   Nausea and vomiting 10/29/2019   Altered bowel habits 10/29/2019   Lumbar compression fracture ([place]) 04/03/2019   Compression fracture of L1 lumbar vertebra ([place]) 03/16/2019   Osteoporosis of lumbar spine 03/16/2019   Essential tremor 02/06/2019   Ischemic colitis ([place])    Leukocytosis    Acute colitis 11/22/2018   Colitis 11/22/2018   Acute lower UTI 11/22/2018   Acute cystitis without hematuria    Parkinson's disease ([place]) 10/24/2018   Gait disturbance 11/28/2017   Chronic lymphocytic thyroiditis 04/24/2017   Status post removal of thyroid nodule 04/24/2017   Depression with anxiety 03/16/2017   Subcutaneous nodules 03/16/2017   Aortic atherosclerosis ([place]) 11/24/2016   Allergy to multiple antibiotics 10/07/2016   Seafood allergy, anaphylaxis, subsequent encounter 08/67/6195   Helicobacter pylori gastritis 10/07/2016   Fall 09/05/2016   Bloating 08/15/2016   Severe recurrent major depression without psychotic features ([place]) 04/13/2016    Class: Chronic   Rash and nonspecific skin eruption 03/01/2016   Leg pain, bilateral 03/01/2016  Functional dyspepsia 10/13/2015   Subacromial bursitis 03/02/2015   Resting tremor 02/01/2015   Epigastric fullness 10/12/2014   Early satiety 10/12/2014   Cystocele 12/30/2013   IBS (irritable bowel syndrome) 09/30/2013   GERD (gastroesophageal reflux disease) 06/27/2013   PVC's (premature ventricular contractions) 10/02/2012   Depressive disorder, not elsewhere classified 10/02/2012   Bradycardia 08/01/2012   Fatigue 08/01/2012   Anxiety state 05/13/2012   Osteopenia 05/13/2012   Hypothyroidism 05/13/2012    Past Surgical History:  Procedure Laterality Date   ABDOMINAL HYSTERECTOMY     CATARACT  EXTRACTION, BILATERAL  1995, 1996   EYE SURGERY Bilateral    laser - glaucoma   Flexible sigmoidoscopy     KYPHOPLASTY N/A 04/03/2019   Procedure: KYPHOPLASTY L1;  Surgeon: Melina Schools, MD;  Location: [facility];  Service: Orthopedics;  Laterality: N/A;  90 mins   THYROIDECTOMY, PARTIAL  09/2005   L nodule; Dr. Harlow Asa   TONSILLECTOMY  1946   UPPER GI ENDOSCOPY  06/27/12   VAGINAL HYSTERECTOMY  1971   and bladder repair.  Still has ovaries   WISDOM TOOTH EXTRACTION       OB History     Gravida  2   Para  2   Term      Preterm      AB      Living  2      SAB      IAB      Ectopic      Multiple      Live Births              Family History  Problem Relation Age of Onset   Heart disease Mother    Hypertension Mother    Hypertension Sister    HIV Son    Heart disease Brother    Lung cancer Brother        lung   Diabetes Maternal Grandfather    Diabetes Granddaughter        type 1   Colon cancer Neg Hx    Esophageal cancer Neg Hx    Prostate cancer Neg Hx    Stomach cancer Neg Hx    Rectal cancer Neg Hx     Social History   Tobacco Use   Smoking status: Never   Smokeless tobacco: Never  Vaping Use   Vaping Use: Never used  Substance Use Topics   Alcohol use: No    Alcohol/week: 0.0 standard drinks   Drug use: No    Home Medications Prior to Admission medications   Medication Sig Start Date End Date Taking? Authorizing Provider  ondansetron (ZOFRAN ODT) 4 MG disintegrating tablet Take 1 tablet (4 mg total) by mouth every 8 (eight) hours as needed for nausea or vomiting. 03/07/21  Yes Caroly Purewal, Ellwood Dense, MD  acetaminophen (TYLENOL) 650 MG CR tablet Take 650 mg by mouth every 8 (eight) hours as needed for pain.    [provider]  ALPRAZolam Duanne Moron) 0.5 MG tablet Take four tablets daily as needed for anxiety. 01/03/21   Mozingo, Berdie Ogren, NP  b complex vitamins tablet Take 1 tablet by mouth daily.    [provider]   Calcium Carbonate (CALCIUM 600 PO) Take 2 capsules by mouth in the morning and at bedtime.    [provider]  Carbidopa-Levodopa ER (SINEMET CR) 25-100 MG tablet controlled release Take 2 po qAm, one po qPM and 2 po qHS 02/21/21   Sater, Nanine Means, MD  Cholecalciferol (  VITAMIN D) 2000 UNITS tablet Take 2,000 Units by mouth daily.    [provider]  dicyclomine (BENTYL) 10 MG capsule As needed Patient not taking: Reported on 03/02/2021 02/05/19   [provider]  Probiotic Product (ALIGN PO) Take 1 capsule by mouth daily.    [provider]  sertraline (ZOLOFT) 25 MG tablet Take 1/2 tablet (12.5 mg) by mouth daily Patient not taking: Reported on 03/02/2021 02/24/21   Mozingo, Berdie Ogren, NP  SYNTHROID 25 MCG tablet TAKE 1 TABLET DAILY BEFORE BREAKFAST FOR HYPOTHYROIDISM 02/03/21   [name], MD    Allergies    Iodine, Levsin [hyoscyamine sulfate], Salmon [fish allergy], Shellfish allergy, Tramadol, Remeron [mirtazapine], Aspirin, Bis subcit-metronid-tetracyc, Cephalexin, Ciprofloxacin, Codeine, Contrast media [iodinated diagnostic agents], Cyclobenzaprine, Darvocet [propoxyphene n-acetaminophen], Demerol [meperidine], Dexlansoprazole, Diphedryl [diphenhydramine], Doxycycline hyclate, Doxycycline hyclate, Epinephrine, Erythromycin, Fish oil, Hyoscyamine, Latex, Meperidine hcl, Nitrofurantoin, Other, Prednisone, Ra diphedryl allergy [diphenhydramine hcl], Sertraline hcl, Shellfish-derived products, Sulfa antibiotics, Wellbutrin [bupropion], Xylocaine [lidocaine hcl], Xylocaine [lidocaine], Biaxin [clarithromycin], and Ibuprofen  Review of Systems   Review of Systems  Constitutional:  Positive for chills and fatigue. Negative for fever.  HENT:  Negative for ear pain and sore throat.   Eyes:  Negative for pain and visual disturbance.  Respiratory:  Negative for cough and shortness of breath.   Cardiovascular:  Negative for chest pain and palpitations.   Gastrointestinal:  Positive for abdominal pain and nausea. Negative for vomiting.  Genitourinary:  Negative for dysuria and hematuria.  Musculoskeletal:  Positive for myalgias. Negative for arthralgias and back pain.  Skin:  Negative for color change and rash.  Neurological:  Positive for headaches. Negative for seizures and syncope.  All other systems reviewed and are negative.  Physical Exam Updated Vital Signs BP (!) 139/50   Pulse 83   Temp 98.1 F (36.7 C) (Oral)   Resp 13   Ht 4' 11"  (1.499 m)   Wt 45.1 kg   LMP  (LMP Unknown)   SpO2 99%   BMI 20.10 kg/m   Physical Exam Vitals and nursing note reviewed.  Constitutional:      General: She is not in acute distress.    Appearance: She is well-developed.  HENT:     Head: Normocephalic and atraumatic.  Eyes:     Conjunctiva/sclera: Conjunctivae normal.  Cardiovascular:     Rate and Rhythm: Normal rate and regular rhythm.     Heart sounds: No murmur heard. Pulmonary:     Effort: Pulmonary effort is normal. No respiratory distress.     Breath sounds: Normal breath sounds.  Abdominal:     Palpations: Abdomen is soft.     Tenderness: There is abdominal tenderness.     Comments: Generalized tenderness to palpation, no rebound or guarding, worse in left upper quadrant  Musculoskeletal:     Cervical back: Neck supple.  Skin:    General: Skin is warm and dry.  Neurological:     Mental Status: She is alert.    ED Results / Procedures / Treatments   Labs (all labs ordered are listed, but only abnormal results are displayed) Labs Reviewed  CBC WITH DIFFERENTIAL/PLATELET - Abnormal; Notable for the following components:      Result Value   WBC 12.5 (*)    Neutro Abs 9.1 (*)    Monocytes Absolute 1.6 (*)    All other components within normal limits  COMPREHENSIVE METABOLIC PANEL - Abnormal; Notable for the following components:   AST 135 (*)  ALT 175 (*)    Total Bilirubin 1.4 (*)    All other components within  normal limits  URINALYSIS, ROUTINE W REFLEX MICROSCOPIC - Abnormal; Notable for the following components:   Hgb urine dipstick TRACE (*)    Ketones, ur TRACE (*)    Leukocytes,Ua MODERATE (*)    All other components within normal limits  RESP PANEL BY RT-PCR (FLU A&B, COVID) ARPGX2  LIPASE, BLOOD  TROPONIN I (HIGH SENSITIVITY)    EKG EKG Interpretation  Date/Time:  Monday March 07 2021 07:53:53 EDT Ventricular Rate:  82 PR Interval:  204 QRS Duration: 110 QT Interval:  397 QTC Calculation: 464 R Axis:   -28 Text Interpretation: Sinus rhythm LVH with secondary repolarization abnormality Anterior infarct, old Baseline wander in lead(s) V6 Confirmed by Madalyn Rob [phone]) on 03/07/2021 8:10:31 AM  Radiology CT ABDOMEN PELVIS WO CONTRAST  Result Date: 03/07/2021 CLINICAL DATA:  Left lower quadrant pain, left upper quadrant pain, diverticulitis suspected EXAM: CT ABDOMEN AND PELVIS WITHOUT CONTRAST TECHNIQUE: Multidetector CT imaging of the abdomen and pelvis was performed following the standard protocol without IV contrast. COMPARISON:  CT abdomen/pelvis 04/08/2019 FINDINGS: Lower chest: Linear opacity in the right lung base likely reflects scarring. The lung bases are otherwise clear. Hepatobiliary: The liver is unremarkable. Layering hyperdense material in the gallbladder likely reflect sludge and/or small stones. There is no evidence of acute cholecystitis. There is no biliary ductal dilatation. Pancreas: Unremarkable. Spleen: Unremarkable. Adrenals/Urinary Tract: The adrenals are unremarkable. A hypodense structure in the region of the left renal pelvis may reflect a cyst or calyceal diverticulum. There are no other focal lesions. There are no calculi. There is no hydronephrosis or hydroureter. The bladder is unremarkable. Stomach/Bowel: The stomach is unremarkable. There is a medially directed duodenal diverticulum, unchanged. There is no evidence of bowel obstruction. There is no  abnormal bowel wall thickening or. Specifically, there is no evidence of acute diverticulitis. Vascular/Lymphatic: There is calcified atherosclerotic plaque throughout the nonaneurysmal abdominal aorta. There is no pathologic lymphadenopathy in the abdomen or pelvis. Reproductive: The patient is status post hysterectomy. There is a 2.9 cm left adnexal cyst, unchanged since at least 2020. There is no right adnexal mass. Other: There is no ascites or free air. Musculoskeletal: The patient is status post kyphoplasty at L1 with moderate loss of vertebral body height and mild bony retropulsion, unchanged. The remaining vertebral body heights are preserved. Grade 1 anterolisthesis of L2 on L3 is unchanged. There is no evidence of acute osseous abnormality or aggressive osseous lesion. IMPRESSION: 1. No evidence of acute diverticulitis or other acute pathology in the abdomen or pelvis to explain the patient's pain. 2. Unchanged left adnexal cyst. Follow-up pelvic ultrasound in 1 year could be considered depending on patient goals of care and comorbidities. 3. Sludge and/or small stones in the gallbladder without evidence of acute cholecystitis. 4.  Aortic Atherosclerosis (ICD10-I70.0). Electronically Signed   By: Valetta Mole M.D.   On: 03/07/2021 09:08   US Abdomen Limited  Result Date: 03/07/2021 CLINICAL DATA:  Right upper quadrant pain. EXAM: ULTRASOUND ABDOMEN LIMITED RIGHT UPPER QUADRANT COMPARISON:  CT Abdomen Pelvis, 03/07/2021. FINDINGS: Gallbladder: Layering gallstones and biliary sludge within a minimally-distended gallbladder. No gallbladder wall thickening visualized. No sonographic Murphy sign noted by sonographer. Common bile duct: Diameter: Near the upper limit of normal in size, at 0.8 cm. Minimal central intrahepatic biliary ductal dilation. Liver: No focal lesion identified. Within normal limits in parenchymal echogenicity. Portal vein is patent on color  Doppler imaging with normal direction of blood  flow towards the liver. Other: No perihepatic ascites IMPRESSION: 1. Cholelithiasis without sonographic evidence of acute cholecystitis. 2. Minimal intrahepatic biliary ductal dilation, and CBD near the upper limit of normal in size. Correlate with obstructive enzymes. Electronically Signed   By: Michaelle Birks M.D.   On: 03/07/2021 10:45   DG Chest Port 1 View  Result Date: 03/07/2021 CLINICAL DATA:  Fever and body aches. EXAM: PORTABLE CHEST 1 VIEW COMPARISON:  Chest x-ray dated March 16, 2020. FINDINGS: The heart size and mediastinal contours are within normal limits. Normal pulmonary vascularity. No focal consolidation, pleural effusion, or pneumothorax. No acute osseous abnormality. Chronic L1 compression deformity status post cement augmentation again noted. Prior partial thyroidectomy. IMPRESSION: No active disease. Electronically Signed   By: Titus Dubin M.D.   On: 03/07/2021 10:47    Procedures Procedures   Medications Ordered in ED Medications  fentaNYL (SUBLIMAZE) injection 50 mcg (50 mcg Intravenous Given 03/07/21 0820)  ondansetron (ZOFRAN) injection 4 mg (4 mg Intravenous Given 03/07/21 0819)  sodium chloride 0.9 % bolus 500 mL (0 mLs Intravenous Stopped 03/07/21 0908)  fentaNYL (SUBLIMAZE) injection 50 mcg (50 mcg Intravenous Given 03/07/21 1039)    ED Course  I have reviewed the triage vital signs and the nursing notes.  Pertinent labs & imaging results that were available during my care of the patient were reviewed by me and considered in my medical decision making (see chart for details).    MDM Rules/Calculators/A&P                           Exhibited presented to the emergency room with concern for abdominal pain.  Also endorsed general fatigue, low-grade fever.  On exam patient well-appearing in no distress with stable vital signs.  No fever here.  RN initially documented right upper quadrant pain but when I inquired, patient states her pain has primarily been  left-sided.  On her exam the tenderness was primarily on the left side.  No focal right upper quadrant pain and negative Murphy sign.  Basic labs noted for slight leukocytosis, mild elevation in AST and ALT but normal alk phos, T bili 1.4.  CT scan negative for acute abdominal pelvic pathology.  Patient already aware of her adnexal cyst and states her primary doctor monitors this for her.  Commented on cholelithiasis.  Checked dedicated right upper quadrant ultrasound.  No sonographic evidence for acute cholecystitis.  On reassessment, patient denied any ongoing pain and remained well-appearing.  Given the work-up today and her current well appearance with lack of ongoing symptoms, believe she is appropriate for discharge and outpatient management.  Tolerated p.o. without difficulty.  Recommend she follow-up with her primary care doctor as well as with general surgery to discuss the possible symptomatic cholelithiasis.  Reviewed return precautions with patient and husband, discharged home.  After the discussed management above, the patient was determined to be safe for discharge.  The patient was in agreement with this plan and all questions regarding their care were answered.  ED return precautions were discussed and the patient will return to the ED with any significant worsening of condition.  Final Clinical Impression(s) / ED Diagnoses Final diagnoses:  Generalized abdominal pain  Calculus of gallbladder without cholecystitis without obstruction    Rx / DC Orders ED Discharge Orders          Ordered    ondansetron (ZOFRAN ODT) 4 MG  disintegrating tablet  Every 8 hours PRN        03/07/21 1141             Lucrezia Starch, MD 03/07/21 1210

## 2021-03-07 NOTE — ED Notes (Signed)
Per okay from provider, patient given a cup of water to drink

## 2021-03-07 NOTE — ED Notes (Signed)
Abdominal pain is improved, patient has a worsening headache.  Provider notified.

## 2021-03-07 NOTE — ED Notes (Signed)
Patient transported to CT 

## 2021-03-07 NOTE — ED Triage Notes (Signed)
Pt states she started to feel bad yesterday, last night, fever, headache, RUQ pain and body aches. This event may have started 2 days ago. Denies N/V/D

## 2021-03-08 ENCOUNTER — Ambulatory Visit (INDEPENDENT_AMBULATORY_CARE_PROVIDER_SITE_OTHER): Payer: Medicare Other | Admitting: Psychiatry

## 2021-03-08 DIAGNOSIS — F411 Generalized anxiety disorder: Secondary | ICD-10-CM

## 2021-03-08 NOTE — Progress Notes (Signed)
[facility] Counselor/Therapist Progress Note  Patient ID: Tracy [facility]one, MRN: [id number],    Date: 03/08/2021  Time Spent: 55 minutes   Virtual Visit via Telephone Note:   TELEPHONE ONLY session Connected with patient by a video enabled telemedicine/telehealth application or telephone, with their informed consent, and verified patient privacy and that I am speaking with the correct person using two identifiers. I discussed the limitations, risks, security and privacy concerns of performing psychotherapy and management service by telephone and the availability of in person appointments. I also discussed with the patient that there may be a patient responsible charge related to this service. The patient expressed understanding and agreed to proceed. I discussed the treatment planning with the patient. The patient was provided an opportunity to ask questions and all were answered. The patient agreed with the plan and demonstrated an understanding of the instructions. The patient was advised to call  our office if  symptoms worsen or feel they are in a crisis state and need immediate contact.   Therapist Location: [facility] Patient Location: home    Treatment Type: Individual Therapy  Reported Symptoms: anxiety, depression, "recent trip to ER due to bad headache" ("Dr. Kristeen Miss it was something viral)  Mental Status Exam:  Appearance:   N/A    telehealth      Behavior:  Appropriate, Sharing, and Motivated  Motor:  N/A     telehealth  Speech/Language:   Normal Rate  Affect:  Depressed and anxious  Mood:  anxious and depressed  Thought process:  goal directed  Thought content:    Some ruminating  Sensory/Perceptual disturbances:    WNL  Orientation:  oriented to person, place, time/date, situation, day of week, month of year, year, and stated date of Aug. 16, 2022  Attention:  Good  Concentration:  Good and Fair  Memory:  [place] of knowledge:   Good  Insight:    Good  and Fair  Judgment:   Good  Impulse Control:  Good   Risk Assessment: Danger to Self:  No Self-injurious Behavior: No Danger to Others: No Duty to Warn:no Physical Aggression / Violence:No  Access to Firearms a concern: No  Gang Involvement:No   Subjective:   Patient today reports anxiety, depression and "no medicine I try seems to work."  Today reporting multiple concerns mostly health related and also shared her tendency to feel badly about her past, giving several examples of what she was referring to.  Several involved situations in which she was either taken advantage of or felt she made bad decisions and felt bad about herself.  She processed these today as she was talking and then realizing how long she has held onto them, it was suggested to her to work on letting go the issues from the past  in order to live in the present which is all that she can impact at this point, and that the present and going forward can offer a lot more peace and happiness if she does let go of certain things from her past.  (Not all details about her past included in this note due to patient privacy needs.) Agrees to work on the "letting go" of painful situations she shared today from her past and is going to practice the interruption and replacement strategy we used in session today, in working on this.  Despite being anxious and depressed, she was able to verbalize her thoughts and feelings quite well today and actually seemed  to give her more energy by the end of session.  Was able to think of some positives from her past also as well as some positives currently.  Feels that using positive affirmations and her faith being strong helps her.  Ended session on a more hopeful note today and encouraged to work on the "letting go" and make it a priority.   Interventions: Cognitive Behavioral Therapy and Solution-Oriented/Positive Psychology  Diagnosis:   ICD-10-CM   1. Generalized anxiety disorder  F41.1         Treatment Goal Plan:  Patient not signing treatment plan on computer screen due to [place]. Treatment Goals: Treatment goals will remain on treatment plan as patient works with strategies to achieve her goals.  Progress will be noted each session and documented in the "progress" section of note. Long term goal: Develop the ability to recognize, accept, and cope with feelings of depression. Short term goal: Verbalize any unresolved grief issues that may be contributing to depression. Strategy: Replace negative self-defeating self talk with verbalization of realistic and positive cognitive messages to lessen depression and improve mood.    Plan:  Patient today showing good motivation even thought she reports struggling with "my emotions".  Did some good work in session and seemed stronger and brighter in her outlook by the end of session.  Think that was definitely related to her sharing a lot of negativity and hurt from her past and am encouraging her to be able to let go of it more more in order to live more in the present.  Courage patient to practice behaviors that have proven to be helpful to her before including: More consistent positive self talk, allowing for good sleep patterns, healthy nutrition, moving about carefully as she is able per her doctor, staying in touch with family and friends who are supportive, staying in the present focusing on what she can control, being open to interacting more with neighbors as she is able, getting outside when she is able, remain on her medications as prescribed, and feel good about the strength she is showing in working with goal-directed behaviors as she tries to move forward in a more positive direction of improved emotional health and stability.  Goal review and progress/challenges noted with patient.  Next appointment within 2 to 3 weeks.   Shanon Ace, LCSW

## 2021-03-09 NOTE — Progress Notes (Signed)
Chief Complaint  Patient presents with   other    Med check no apt yet with general surgeon    Patient presents for 6 month med check. She was seen last week with L upper abdominal pain, after having had diarrhea. She had worsening pain and went to the ER on 8/15. Pain remained on left side. She had some subjective fever/chills, but was afebrile in the ER. WBC was mildly elevated, and she had elevations in AST/ALT, normal alkphos, T bili 1.4. She had CT: IMPRESSION: 1. No evidence of acute diverticulitis or other acute pathology in the abdomen or pelvis to explain the patient's pain. 2. Unchanged left adnexal cyst. Follow-up pelvic ultrasound in 1 year could be considered depending on patient goals of care and comorbidities. 3. Sludge and/or small stones in the gallbladder without evidence of acute cholecystitis. 4.  Aortic Atherosclerosis (ICD10-I70.0).  Adnexal cyst stable x 2 years.  [place] also performed: IMPRESSION: 1. Cholelithiasis without sonographic evidence of acute cholecystitis. 2. Minimal intrahepatic biliary ductal dilation, and CBD near the upper limit of normal in size. Correlate with obstructive enzymes.  It was recommended that she f/u with general surgeon. This is not yet scheduled. Chart reviewed--03/2019 CT normal gallbladder  Today she had 4 slightly loose stools.  She reports she is either constipated (no stool x 5d) or having frequent bowel movements.  She used a suppository yesterday for constipation.  Last week she also had "hot burning" feeling in the left breast, extending to the L axilla. It comes and goes, not constant.  Had some yesterday, none today.  No change overall, no worse since last visit.  Hypothyroidism:  She reports compliance with taking her Synthroid 67mg daily, on an empty stomach, separate from her other medications.  She denies any significant changes to hair (still falling out/thinning per pt, no change from prior visits); no change to  skin--ongoing dry skin; no change in energy--persistent fatigue. Weight is unchanged. Alternating constipation and frequent bowel movements are her norm. Lab Results  Component Value Date   TSH 2.310 01/22/2020  Thyroid studies were rechecked by Dr. KBuddy Dutyin 07/2020, and were normal. TSH 1.57.   Osteoporosis, with L1 compression fracture, s/p kyphoplasty. She had known osteoporosis prior to this fracture (DEXA done in 2016 by Dr. HTrudie Reed. She stopped Prolia due to joint pains.  She has seen Dr. KBuddy Dutyin follow-up to discuss her osteoporosis, last in 07/2020. She had normal Vitamin D3 (45.6) at that time. They discussed Reclast infusion.  She recalls taking fosamax in the distant past--didn't have problems with joint pains, just "tore up her stomach".  She wasn't interested in Reclast as it listed the same side effects (joint pains) as she had with the Prolia. At her Wellness visit 6 months ago she was considering having allergy testing for salmon to see if calcitonin is a possibility for treatment. There was no follow-up on any osteoporosis treatments. DEXA was done by Dr. GHelane Rima1/27/22 T-4.1 at R femoral neck, -3.7 at L femoral neck, -3.2 spine. Vitamin D level was normal at 50 in 08/2019.   Chronic back pain--she had no benefit from injections (selective nerve root blocks) through [facility].  She had recent MRI. She was tender at L SI joint at her visit last week, still complains of pain in that area.   Depression:  Under the care of psychiatrist and therapist. This has not been well controlled, she remains very depressed.  She has been off Prozac for 2-3 weeks.  She states she was having burning and pain across her stomach, couldn't eat. That pain resolved after stopping the Prozac. She never started the zoloft that was prescribed by [name] (see below), waiting to discuss further with [facility] next week.   Parkinson's and Essential Tremor, under the care of [name]. She last saw him earlier this  month. She continues on Sinimet; he recommended increase dose to 5 pills/d (2/1/2), but she couldn't tolerate, is back to taking QID.  He changed prozac to zoloft, but she has not made this change. She sees [employer] next week and plans to discuss this suggestion.  Atherosclerotic calcification of the aortic knob was noted on CXR in 02/2020 (and aortic atherosclerosis noted on recent CT). She was also noted to have 20-30% stenosis of R ICA on [place] in 2013, mild plaque noted. She was intolerant of statins due to myalgias, not interested in trying others. Lab Results  Component Value Date   CHOL 233 (H) 08/29/2018   HDL 91 08/29/2018   LDLCALC 116 (H) 08/29/2018   TRIG 128 08/29/2018   CHOLHDL 2.6 08/29/2018     PMH, PSH, SH reviewed  Outpatient Encounter Medications as of 03/10/2021  Medication Sig Note   ALPRAZolam (XANAX) 0.5 MG tablet Take four tablets daily as needed for anxiety.    b complex vitamins tablet Take 1 tablet by mouth daily.    Calcium Carbonate (CALCIUM 600 PO) Take 2 capsules by mouth in the morning and at bedtime.    Carbidopa-Levodopa ER (SINEMET CR) 25-100 MG tablet controlled release Take 2 po qAm, one po qPM and 2 po qHS    Cholecalciferol (VITAMIN D) 2000 UNITS tablet Take 2,000 Units by mouth daily.    Probiotic Product (ALIGN PO) Take 1 capsule by mouth daily.    SYNTHROID 25 MCG tablet TAKE 1 TABLET DAILY BEFORE BREAKFAST FOR HYPOTHYROIDISM    acetaminophen (TYLENOL) 650 MG CR tablet Take 650 mg by mouth every 8 (eight) hours as needed for pain. (Patient not taking: Reported on 03/10/2021) 03/10/2021: Prn none today   dicyclomine (BENTYL) 10 MG capsule As needed (Patient not taking: No sig reported) 03/10/2021: Prn none today   ondansetron (ZOFRAN ODT) 4 MG disintegrating tablet Take 1 tablet (4 mg total) by mouth every 8 (eight) hours as needed for nausea or vomiting. (Patient not taking: Reported on 03/10/2021) 03/10/2021: Has not picked it up yet    [DISCONTINUED] sertraline (ZOLOFT) 25 MG tablet Take 1/2 tablet (12.5 mg) by mouth daily (Patient not taking: Reported on 03/02/2021)    Facility-Administered Encounter Medications as of 03/10/2021  Medication   0.9 %  sodium chloride infusion   Allergies  Allergen Reactions   Iodine Anaphylaxis    IV and topical forms. Other reaction(s): Unknown   Levsin [Hyoscyamine Sulfate]     Vision problems/pt has glaucoma   Salmon [Fish Allergy] Hives and Shortness Of Breath   Shellfish Allergy Anaphylaxis   Tramadol Swelling   Remeron [Mirtazapine] Other (See Comments)    Cause blurred vision and red eyes, pt has glaucoma   Aspirin Other (See Comments)    Sever stomach pain due to ulcer scaring.   Bis Subcit-Metronid-Tetracyc Swelling    Tongue swelling. Face tingling Other reaction(s): Unknown   Cephalexin Hives    Other reaction(s): hives   Ciprofloxacin Diarrhea   Codeine Nausea And Vomiting   Contrast Media [Iodinated Diagnostic Agents]    Cyclobenzaprine Other (See Comments)    Tingly/prickly sensation. Other reaction(s): tingly/prickly  sensation   Darvocet [Propoxyphene N-Acetaminophen] Nausea And Vomiting   Demerol [Meperidine] Nausea Only   Dexlansoprazole Swelling    Redness, swelling and peeling of both feet. Other reaction(s): foot pain   Diphedryl [Diphenhydramine] Other (See Comments)    Increased pulse/small amount ok   Doxycycline Hyclate Other (See Comments)    GI intolerance.   Doxycycline Hyclate     Other reaction(s): GI intolerance   Epinephrine Other (See Comments)    Breathing problems Other reaction(s): breathing problems/fainting   Erythromycin Other (See Comments)    GI intolerance. Other reaction(s): GI   Fish Oil     Other reaction(s): breathing problems/hives   Hyoscyamine     Other reaction(s): eye pain   Latex Other (See Comments)    Gloves ok.  Skin gets red from elastic in underwear and latex bandaides.   Meperidine Hcl     Other  reaction(s): vomiting   Nitrofurantoin Diarrhea   Other     Other reaction(s): migraines Other reaction(s): Unknown Other reaction(s): Unknown Other reaction(s): Unknown Other reaction(s): increased pulse, faint, diarrhea   Prednisone Other (See Comments)    Headache Other reaction(s): headache   Ra Diphedryl Allergy [Diphenhydramine Hcl]     Other reaction(s): increased pulse small dose okay   Sertraline Hcl Swelling and Other (See Comments)    Migraine Swelling of tongue/lip (09/2012) Other reaction(s): Unknown   Shellfish-Derived Products     Other reaction(s): Unknown   Sulfa Antibiotics Other (See Comments)    Increased pulse, fainting, diarrhea, thrush   Wellbutrin [Bupropion] Other (See Comments)    Headaches   Xylocaine [Lidocaine Hcl]     With epinephrine, given by dentist.  Speeded up heart rate and she passed out (occured twice, at dentist)   Xylocaine [Lidocaine]     Other reaction(s): Unknown   Biaxin [Clarithromycin] Rash    Started after completing 10 day course of 2000 mg /day, Lips swelling   Ibuprofen Other (See Comments)    Motrin ok with a GI effect. Other reaction(s): rash Motrin okay with a GI effect   ROS:  denies fevers since ER visit on Monday. No URI symptoms. No chest pain, just the burning of L breast/chest wall. No nausea, vomiting. Decreased appetite.  Alternating constipation and frequent stools--had 4 BM today, slightly loose (needed to use suppository yesterday). Denies any urinary complaints.  (Recent u/a at hospital--mod leuks, but only 0-5 on microscopic exam) No bleeding or rashes.  Bruises easily.   PHYSICAL EXAM:  BP 122/78   Pulse 73   Temp 97.7 F (36.5 C)   Wt 99 lb (44.9 kg)   LMP  (LMP Unknown)   BMI 20.00 kg/m   Wt Readings from Last 3 Encounters:  03/07/21 99 lb 8 oz (45.1 kg)  03/02/21 99 lb 9.6 oz (45.2 kg)  02/21/21 99 lb 8 oz (45.1 kg)   Tired-appearing, frail elderly female, in no distress except with position  changes (back pain). She is accompanied by her husband, and brings her own pillow. HEENT: conjunctiva and sclera clear, EOMI, wearing mask Neck: no lymphadenopathy or mass Heart: regular rate and rhythm Lungs: clear bilaterally Abdomen: normal bowel sounds, soft. Tender at LUQ (unchanged from last week), mildly tender at epigastrium. When RUQ palpated, caused pain to radiate to L side of the back. (Not actually tender at RUQ).  No rebound or guarding. Back: no spinal tenderness.  Tender at L SI Extremities: no edema Skin: no rashes Psych: appears depressed.  Normal eye  contact and speech Neuro: alert and oriented, some tremor noted.  Normal gait.    ASSESSMENT/PLAN:  Calculus of gallbladder without cholecystitis without obstruction - sludge vs stones.  Recent elevations of LFT's.  Recheck today.  Pt to contact [facility] to schedule consult.  Not suspecting need for surgery soon - Plan: CBC with Differential/Platelet  Elevated LFTs - recheck today--will send results to [facility] - Plan: Hepatic function panel  Leukocytosis, unspecified type - noted in ER.  Recheck.  If increasing WBC, may be indication for more invasive treatment of GB issues - Plan: CBC with Differential/Platelet  Hypothyroidism, unspecified type - Plan: TSH  Osteoporosis of lumbar spine - reviewed history and conversations with her other docs--encouraged Reclast. to f/u with Dr. Buddy Duty  Parkinson disease [facility]) - unable to tolerate higher doses of C/L  Depression, recurrent ([place]) - currently off all meds--to f/u as planned with psych  Chronic bilateral low back pain without sciatica - under care of ortho--mainly now at L SI joint, consider another injection  Aortic atherosclerosis ([place]) - intolerant of statins, refuses.  Will recheck lipids today (if very high, would consider zetia, not statin) - Plan: Lipid panel  Hypercholesteremia - Plan: Lipid panel  F/u in 08/2021 as scheduled, sooner prn.  Please discuss Reclast infusion  for treatment of osteoporosis with Dr. Buddy Duty. Given that you previously didn't have joint pains when you took Fosamax in the past, you likely will tolerate the Reclast just fine.  It is a different class of medication from [place], and while they may list the same side effects, it is not a given that you will have joint pains from the Reclast (if you didn't with the fosamax).  We are rechecking your blood work today (blood counts looking for infection and liver tests). We are also rechecking your thyroid. Please contact [facility] and schedule an appointment (phone number is on your paperwork).

## 2021-03-10 ENCOUNTER — Other Ambulatory Visit: Payer: Self-pay

## 2021-03-10 ENCOUNTER — Ambulatory Visit (INDEPENDENT_AMBULATORY_CARE_PROVIDER_SITE_OTHER): Payer: Medicare Other | Admitting: Family Medicine

## 2021-03-10 ENCOUNTER — Encounter: Payer: Self-pay | Admitting: Family Medicine

## 2021-03-10 VITALS — BP 122/78 | HR 73 | Temp 97.7°F | Wt 99.0 lb

## 2021-03-10 DIAGNOSIS — R7989 Other specified abnormal findings of blood chemistry: Secondary | ICD-10-CM | POA: Diagnosis not present

## 2021-03-10 DIAGNOSIS — K8020 Calculus of gallbladder without cholecystitis without obstruction: Secondary | ICD-10-CM | POA: Diagnosis not present

## 2021-03-10 DIAGNOSIS — D72829 Elevated white blood cell count, unspecified: Secondary | ICD-10-CM

## 2021-03-10 DIAGNOSIS — E039 Hypothyroidism, unspecified: Secondary | ICD-10-CM

## 2021-03-10 DIAGNOSIS — E7800 Pure hypercholesterolemia, unspecified: Secondary | ICD-10-CM

## 2021-03-10 DIAGNOSIS — I700 Atherosclerosis of aorta: Secondary | ICD-10-CM

## 2021-03-10 DIAGNOSIS — G8929 Other chronic pain: Secondary | ICD-10-CM

## 2021-03-10 DIAGNOSIS — G20 Parkinson's disease: Secondary | ICD-10-CM | POA: Diagnosis not present

## 2021-03-10 DIAGNOSIS — M545 Low back pain, unspecified: Secondary | ICD-10-CM

## 2021-03-10 DIAGNOSIS — F339 Major depressive disorder, recurrent, unspecified: Secondary | ICD-10-CM

## 2021-03-10 DIAGNOSIS — M810 Age-related osteoporosis without current pathological fracture: Secondary | ICD-10-CM

## 2021-03-10 NOTE — Patient Instructions (Signed)
  Please discuss Reclast infusion for treatment of osteoporosis with Dr. Buddy Duty. Given that you previously didn't have joint pains when you took Fosamax in the past, you likely will tolerate the Reclast just fine.  It is a different class of medication from [place], and while they may list the same side effects, it is not a given that you will have joint pains from the Reclast (if you didn't with the fosamax).  We are rechecking your blood work today (blood counts looking for infection and liver tests). We are also rechecking your thyroid. Please contact [facility] and schedule an appointment (phone number is on your paperwork).

## 2021-03-11 LAB — CBC WITH DIFFERENTIAL/PLATELET
Basophils Absolute: 0 10*3/uL (ref 0.0–0.2)
Basos: 1 %
EOS (ABSOLUTE): 0.1 10*3/uL (ref 0.0–0.4)
Eos: 1 %
Hematocrit: 39.5 % (ref 34.0–46.6)
Hemoglobin: 13.4 g/dL (ref 11.1–15.9)
Immature Grans (Abs): 0 10*3/uL (ref 0.0–0.1)
Immature Granulocytes: 0 %
Lymphocytes Absolute: 1.4 10*3/uL (ref 0.7–3.1)
Lymphs: 17 %
MCH: 30.1 pg (ref 26.6–33.0)
MCHC: 33.9 g/dL (ref 31.5–35.7)
MCV: 89 fL (ref 79–97)
Monocytes Absolute: 1.3 10*3/uL — ABNORMAL HIGH (ref 0.1–0.9)
Monocytes: 15 %
Neutrophils Absolute: 5.5 10*3/uL (ref 1.4–7.0)
Neutrophils: 66 %
Platelets: 266 10*3/uL (ref 150–450)
RBC: 4.45 x10E6/uL (ref 3.77–5.28)
RDW: 12.2 % (ref 11.7–15.4)
WBC: 8.4 10*3/uL (ref 3.4–10.8)

## 2021-03-11 LAB — LIPID PANEL
Chol/HDL Ratio: 3 ratio (ref 0.0–4.4)
Cholesterol, Total: 198 mg/dL (ref 100–199)
HDL: 66 mg/dL (ref >39)
LDL Chol Calc (NIH): 112 mg/dL — ABNORMAL HIGH (ref 0–99)
Triglycerides: 111 mg/dL (ref 0–149)
VLDL Cholesterol Cal: 20 mg/dL (ref 5–40)

## 2021-03-11 LAB — HEPATIC FUNCTION PANEL
ALT: 19 IU/L (ref 0–32)
AST: 24 IU/L (ref 0–40)
Albumin: 4 g/dL (ref 3.6–4.6)
Alkaline Phosphatase: 84 IU/L (ref 44–121)
Bilirubin Total: 0.4 mg/dL (ref 0.0–1.2)
Bilirubin, Direct: 0.11 mg/dL (ref 0.00–0.40)
Total Protein: 6.1 g/dL (ref 6.0–8.5)

## 2021-03-11 LAB — TSH: TSH: 1.53 u[IU]/mL (ref 0.450–4.500)

## 2021-03-15 ENCOUNTER — Telehealth: Payer: Self-pay | Admitting: Adult Health

## 2021-03-15 ENCOUNTER — Encounter: Payer: Self-pay | Admitting: Adult Health

## 2021-03-15 ENCOUNTER — Ambulatory Visit (INDEPENDENT_AMBULATORY_CARE_PROVIDER_SITE_OTHER): Payer: Medicare Other | Admitting: Adult Health

## 2021-03-15 ENCOUNTER — Other Ambulatory Visit: Payer: Self-pay

## 2021-03-15 DIAGNOSIS — F411 Generalized anxiety disorder: Secondary | ICD-10-CM | POA: Diagnosis not present

## 2021-03-15 DIAGNOSIS — F331 Major depressive disorder, recurrent, moderate: Secondary | ICD-10-CM

## 2021-03-15 DIAGNOSIS — F410 Panic disorder [episodic paroxysmal anxiety] without agoraphobia: Secondary | ICD-10-CM

## 2021-03-15 DIAGNOSIS — G4700 Insomnia, unspecified: Secondary | ICD-10-CM

## 2021-03-15 MED ORDER — ESCITALOPRAM OXALATE 5 MG PO TABS
5.0000 mg | ORAL_TABLET | Freq: Every day | ORAL | 5 refills | Status: DC
Start: 2021-03-15 — End: 2021-04-14

## 2021-03-15 MED ORDER — DESVENLAFAXINE SUCCINATE ER 25 MG PO TB24: 25.0000 mg | ORAL_TABLET | Freq: Every morning | ORAL | 2 refills | Status: DC

## 2021-03-15 NOTE — Telephone Encounter (Signed)
Can we get a number for the daughter?

## 2021-03-15 NOTE — Progress Notes (Signed)
Tracy Malone [id number] 1932-03-28 87 y.o.  Subjective:   Patient ID:  Tracy Malone is a 87 y.o. (DOB May 12, 1932) female.  Chief Complaint: No chief complaint on file.   HPI  Accompanied by daughter.  Tracy Malone presents to the office today for follow-up of MDD, GAD, panic attacks and insomnia.  Describes mood today as "not very good". Pleasant. Tearful - "increased". Mood symptoms - reports depression, anxiety, and irritability. Reports panic attacks. Stating "I'm not doing to well". Stopped Prozac due to GI issues. Stating "it was making me feel sick of my stomach". Symptoms have resolved since discontinuing. Willing to try another medication. Stating "I need to feel better". Daughter with genesight testing and indicating Lexapro as an option - no interference with the Carba-Dopa Levo-dopa. Patient willing to try at a low dose. Diagnosed with Parkinson's in 2017. Decreased interest and motivation. Taking medications as prescribed. Seeing therapist - Rinaldo Cloud. Energy levels lower. Using a rolling walker today. Active, does not have a regular exercise routine. Enjoys some usual interests and activities. Married. Lives with husband of 70 years. 2 children 59 and 55. Son lives in [place]. Spending time with family. Appetite adequate. Weight loss 94.2. Sleeping difficulties. Averages 5 hours - up and down during the night. Reports some daytime napping.  Focus and concentration stable. Completing tasks. Managing aspects of household. Retired.  Denies SI or HI.  Denies AH or VH.  Previous medication trials: Multiple medication trials   PHQ2-9    [place] Office Visit from 09/08/2020 in [facility] Visit from 09/03/2019 in [facility] Patient Outreach Telephone from 12/02/2018 in [facility] Visit from 08/29/2018 in [facility] Visit from 08/27/2017 in [facility] [place]  PHQ-2 Total Score '6 6 1 4 4  '$ PHQ-9  Total Score 7 17 -- -- --      Flowsheet Row ED from 03/07/2021 in [facility]  C-SSRS RISK CATEGORY No Risk        Review of Systems:  Review of Systems  Musculoskeletal:  Negative for gait problem.  Neurological:  Negative for tremors.  Psychiatric/Behavioral:         Please refer to HPI   Medications: I have reviewed the patient's current medications.  Current Outpatient Medications  Medication Sig Dispense Refill   escitalopram (LEXAPRO) 5 MG tablet Take 1 tablet (5 mg total) by mouth daily. 30 tablet 5   acetaminophen (TYLENOL) 650 MG CR tablet Take 650 mg by mouth every 8 (eight) hours as needed for pain. (Patient not taking: Reported on 03/10/2021)     ALPRAZolam (XANAX) 0.5 MG tablet Take four tablets daily as needed for anxiety. 120 tablet 2   b complex vitamins tablet Take 1 tablet by mouth daily.     Calcium Carbonate (CALCIUM 600 PO) Take 2 capsules by mouth in the morning and at bedtime.     Carbidopa-Levodopa ER (SINEMET CR) 25-100 MG tablet controlled release Take 2 po qAm, one po qPM and 2 po qHS 450 tablet 3   Cholecalciferol (VITAMIN D) 2000 UNITS tablet Take 2,000 Units by mouth daily.     dicyclomine (BENTYL) 10 MG capsule As needed (Patient not taking: No sig reported)     ondansetron (ZOFRAN ODT) 4 MG disintegrating tablet Take 1 tablet (4 mg total) by mouth every 8 (eight) hours as needed for nausea or vomiting. (Patient not taking: Reported on 03/10/2021) 20 tablet 0   Probiotic Product (  ALIGN PO) Take 1 capsule by mouth daily.     SYNTHROID 25 MCG tablet TAKE 1 TABLET DAILY BEFORE BREAKFAST FOR HYPOTHYROIDISM 90 tablet 0   Current Facility-Administered Medications  Medication Dose Route Frequency Provider Last Rate Last Admin   0.9 %  sodium chloride infusion  500 mL Intravenous Once Milus Banister, MD        Medication Side Effects: None  Allergies:  Allergies  Allergen Reactions   Iodine Anaphylaxis    IV and topical  forms. Other reaction(s): Unknown   Levsin [Hyoscyamine Sulfate]     Vision problems/pt has glaucoma   Salmon [Fish Allergy] Hives and Shortness Of Breath   Shellfish Allergy Anaphylaxis   Tramadol Swelling   Remeron [Mirtazapine] Other (See Comments)    Cause blurred vision and red eyes, pt has glaucoma   Aspirin Other (See Comments)    Sever stomach pain due to ulcer scaring.   Bis Subcit-Metronid-Tetracyc Swelling    Tongue swelling. Face tingling Other reaction(s): Unknown   Cephalexin Hives    Other reaction(s): hives   Ciprofloxacin Diarrhea   Codeine Nausea And Vomiting   Contrast Media [Iodinated Diagnostic Agents]    Cyclobenzaprine Other (See Comments)    Tingly/prickly sensation. Other reaction(s): tingly/prickly sensation   Darvocet [Propoxyphene N-Acetaminophen] Nausea And Vomiting   Demerol [Meperidine] Nausea Only   Dexlansoprazole Swelling    Redness, swelling and peeling of both feet. Other reaction(s): foot pain   Diphedryl [Diphenhydramine] Other (See Comments)    Increased pulse/small amount ok   Doxycycline Hyclate Other (See Comments)    GI intolerance.   Doxycycline Hyclate     Other reaction(s): GI intolerance   Epinephrine Other (See Comments)    Breathing problems Other reaction(s): breathing problems/fainting   Erythromycin Other (See Comments)    GI intolerance. Other reaction(s): GI   Fish Oil     Other reaction(s): breathing problems/hives   Hyoscyamine     Other reaction(s): eye pain   Latex Other (See Comments)    Gloves ok.  Skin gets red from elastic in underwear and latex bandaides.   Meperidine Hcl     Other reaction(s): vomiting   Nitrofurantoin Diarrhea   Other     Other reaction(s): migraines Other reaction(s): Unknown Other reaction(s): Unknown Other reaction(s): Unknown Other reaction(s): increased pulse, faint, diarrhea   Prednisone Other (See Comments)    Headache Other reaction(s): headache   Ra Diphedryl Allergy  [Diphenhydramine Hcl]     Other reaction(s): increased pulse small dose okay   Sertraline Hcl Swelling and Other (See Comments)    Migraine Swelling of tongue/lip (09/2012) Other reaction(s): Unknown   Shellfish-Derived Products     Other reaction(s): Unknown   Sulfa Antibiotics Other (See Comments)    Increased pulse, fainting, diarrhea, thrush   Wellbutrin [Bupropion] Other (See Comments)    Headaches   Xylocaine [Lidocaine Hcl]     With epinephrine, given by dentist.  Speeded up heart rate and she passed out (occured twice, at dentist)   Xylocaine [Lidocaine]     Other reaction(s): Unknown   Biaxin [Clarithromycin] Rash    Started after completing 10 day course of 2000 mg /day, Lips swelling   Ibuprofen Other (See Comments)    Motrin ok with a GI effect. Other reaction(s): rash Motrin okay with a GI effect    Past Medical History:  Diagnosis Date   Bell's palsy 1966   Hx: right side facial droop, resolved per patient 04/02/19   Carotid  artery disease ([place]) 2010   on vascular screening;unchanged 2013.(could not tolerate simvastatin, no other statins tried)--<30% blockage bilat 07/2011   Chronic abdominal pain    Chronic fatigue and malaise    Claustrophobia    Depression    treated in the past for years;stopped in 2010 for a years   Duodenal ulcer 1962   h/o   Dysrhythmia    ocassional PVC's, no current problems per patient on 04/02/19   Fibromyalgia    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 Benson [place]   Ischemic colitis ([place]) 11/21/2018   no current problems per patient on [id number]   Lichenoid keratosis [id number]   Dr.Stinehelfer   Ocular migraine    Osteoporosis 10/11   Dr.Hawkes   Panic attack    Parkinson disease ([place])     Parkinson's disease ([place]) 06/23/2016   Recurrent UTI    has cystocele-Dr.Grewal   Shingles 1999   h/o   Superficial thrombophlebitis 03/2009   RLE   Trochanteric bursitis 12/2008   bilateral    Past Medical History, Surgical history, Social history, and Family history were reviewed and updated as appropriate.   Please see review of systems for further details on the patient's review from today.   Objective:   Physical Exam:  LMP  (LMP Unknown)   Physical Exam Constitutional:      General: She is not in acute distress. Musculoskeletal:        General: No deformity.  Neurological:     Mental Status: She is alert and oriented to person, place, and time.     Coordination: Coordination normal.  Psychiatric:        Attention and Perception: Attention and perception normal. She does not perceive auditory or visual hallucinations.        Mood and Affect: Mood normal. Mood is not anxious or depressed. Affect is not labile, blunt, angry or inappropriate.        Speech: Speech normal.        Behavior: Behavior normal.        Thought Content: Thought content normal. Thought content is not paranoid or delusional. Thought content does not include homicidal or suicidal ideation. Thought content does not include homicidal or suicidal plan.        Cognition and Memory: Cognition and memory normal.        Judgment: Judgment normal.     Comments: Insight intact    Lab Review:     Component Value Date/Time   NA 139 03/07/2021 0755   NA 141 09/03/2019 1117   K 3.8 03/07/2021 0755   CL 101 03/07/2021 0755   CO2 29 03/07/2021 0755   GLUCOSE 99 03/07/2021 0755   BUN 13 03/07/2021 0755   BUN 14 09/03/2019 1117   CREATININE 0.62 03/07/2021 0755   CREATININE 0.75 10/22/2018 1401   CREATININE 0.67 11/23/2016 0815   CALCIUM 9.7 03/07/2021 0755   PROT 6.1 03/10/2021 1547   ALBUMIN 4.0 03/10/2021 1547   AST 24 03/10/2021 1547   AST 17 10/22/2018 1401   ALT 19 03/10/2021 1547   ALT 7  10/22/2018 1401   ALKPHOS 84 03/10/2021 1547   BILITOT 0.4 03/10/2021 1547  BILITOT 0.8 10/22/2018 1401   GFRNONAA >60 03/07/2021 0755   GFRNONAA >60 10/22/2018 1401   GFRAA >60 03/16/2020 1153   GFRAA >60 10/22/2018 1401       Component Value Date/Time   WBC 8.4 03/10/2021 1547   WBC 12.5 (H) 03/07/2021 0755   RBC 4.45 03/10/2021 1547   RBC 4.57 03/07/2021 0755   HGB 13.4 03/10/2021 1547   HCT 39.5 03/10/2021 1547   PLT 266 03/10/2021 1547   MCV 89 03/10/2021 1547   MCH 30.1 03/10/2021 1547   MCH 30.4 03/07/2021 0755   MCHC 33.9 03/10/2021 1547   MCHC 33.3 03/07/2021 0755   RDW 12.2 03/10/2021 1547   LYMPHSABS 1.4 03/10/2021 1547   MONOABS 1.6 (H) 03/07/2021 0755   EOSABS 0.1 03/10/2021 1547   BASOSABS 0.0 03/10/2021 1547    No results found for: POCLITH, LITHIUM   No results found for: PHENYTOIN, PHENOBARB, VALPROATE, CBMZ   .res Assessment: Plan:    Plan:  PDMP reviewed  1. Xanax 0.'5mg'$  - 4 x daily prn anxiety    2. D/C Prozac '10mg'$  daily   3. Add Lexapro '5mg'$  daily  Consider Viibryd  RTC 6 weeks  Patient advised to contact office with any questions, adverse effects, or acute worsening in signs and symptoms.  Discussed potential benefits, risk, and side effects of benzodiazepines to include potential risk of tolerance and dependence, as well as possible drowsiness.  Advised patient not to drive if experiencing drowsiness and to take lowest possible effective dose to minimize risk of dependence and tolerance   Diagnoses and all orders for this visit:  Generalized anxiety disorder -     escitalopram (LEXAPRO) 5 MG tablet; Take 1 tablet (5 mg total) by mouth daily.  Panic attacks -     escitalopram (LEXAPRO) 5 MG tablet; Take 1 tablet (5 mg total) by mouth daily.  Insomnia, unspecified type  Major depressive disorder, recurrent episode, moderate (HCC)  Other orders -     Discontinue: Desvenlafaxine Succinate ER (PRISTIQ) 25 MG TB24; Take 25 mg by  mouth every morning.    Please see After Visit Summary for patient specific instructions.  Future Appointments  Date Time Provider [place]  03/22/2021  3:00 PM Shanon Ace, [place] [facility] None  04/05/2021  3:00 PM Shanon Ace, LCSW CP-CP None  04/19/2021  3:00 PM Shanon Ace, LCSW CP-CP None  05/03/2021  3:00 PM Shanon Ace, LCSW CP-CP None  05/17/2021  3:00 PM Shanon Ace, LCSW CP-CP None  08/25/2021  2:30 PM Sater, Nanine Means, MD [facility] None  08/31/2021  1:45 PM [name], MD [facility] [place]  11/30/2021  8:45 AM [name], MD [facility]    No orders of the defined types were placed in this encounter.   -------------------------------

## 2021-03-15 NOTE — Telephone Encounter (Signed)
Do you have the number her daughter called from?

## 2021-03-15 NOTE — Telephone Encounter (Signed)
Pt.'s daughter, Ned Card called about about her mom, Tracy Malone.  The pharmacy flagged her Lexapro as having iodine.  The daughter thinks her mom does NOT have a problem with iodine but rather shellfish.  She can eat salt which has iodine it it w/o problems.  The Prestiq has the same "iodine" warning.  Daughter wants call back to confirm that it's ok to take the med.

## 2021-03-15 NOTE — Telephone Encounter (Signed)
Please review

## 2021-03-16 DIAGNOSIS — F411 Generalized anxiety disorder: Secondary | ICD-10-CM | POA: Diagnosis not present

## 2021-03-16 DIAGNOSIS — M7551 Bursitis of right shoulder: Secondary | ICD-10-CM | POA: Diagnosis not present

## 2021-03-16 DIAGNOSIS — M7062 Trochanteric bursitis, left hip: Secondary | ICD-10-CM | POA: Diagnosis not present

## 2021-03-16 DIAGNOSIS — M5116 Intervertebral disc disorders with radiculopathy, lumbar region: Secondary | ICD-10-CM | POA: Diagnosis not present

## 2021-03-16 DIAGNOSIS — M7061 Trochanteric bursitis, right hip: Secondary | ICD-10-CM | POA: Diagnosis not present

## 2021-03-16 DIAGNOSIS — G20 Parkinson's disease: Secondary | ICD-10-CM | POA: Diagnosis not present

## 2021-03-16 NOTE — Telephone Encounter (Signed)
[phone]

## 2021-03-16 NOTE — Telephone Encounter (Signed)
Called and spoke with daughter

## 2021-03-16 NOTE — Telephone Encounter (Signed)
[name] the daughter's number is [phone]

## 2021-03-17 DIAGNOSIS — M5136 Other intervertebral disc degeneration, lumbar region: Secondary | ICD-10-CM | POA: Diagnosis not present

## 2021-03-18 ENCOUNTER — Ambulatory Visit (INDEPENDENT_AMBULATORY_CARE_PROVIDER_SITE_OTHER): Payer: Medicare Other | Admitting: Medical

## 2021-03-18 VITALS — BP 110/62 | HR 69 | Temp 97.5°F | Wt 99.2 lb

## 2021-03-18 DIAGNOSIS — R21 Rash and other nonspecific skin eruption: Secondary | ICD-10-CM | POA: Diagnosis not present

## 2021-03-18 DIAGNOSIS — B029 Zoster without complications: Secondary | ICD-10-CM | POA: Diagnosis not present

## 2021-03-18 DIAGNOSIS — Z889 Allergy status to unspecified drugs, medicaments and biological substances status: Secondary | ICD-10-CM

## 2021-03-18 DIAGNOSIS — R54 Age-related physical debility: Secondary | ICD-10-CM

## 2021-03-18 MED ORDER — TRIAMCINOLONE ACETONIDE 0.1 % EX CREA: 1.0000 "application " | TOPICAL_CREAM | Freq: Two times a day (BID) | CUTANEOUS | 0 refills | Status: DC

## 2021-03-18 MED ORDER — ACYCLOVIR 200 MG/5ML PO SUSP: 400.0000 mg | Freq: Three times a day (TID) | ORAL | 0 refills | Status: DC

## 2021-03-18 NOTE — Progress Notes (Signed)
Subjective:  Tracy Malone is a 87 y.o. female who presents for Chief Complaint  Patient presents with   Mass    Left breast and burning for a couple of weeks      Here today accompanied by her husband.  She was just here last week and saw Dr. Tomi Bamberger for the same symptoms except she did not have a rash at that time.  For least a few weeks she has been having some discomfort over the left chest, a burning sensation.  In the last several days though the burning sensation and itching got more intense and now she has a round red rash over her left breast.  She denies fever, no new body aches or chills.  But it is burning, itchy and has discomfort.  She and Dr. Tomi Bamberger discussed the possibility of shingles even before the rash was present.  She was seen at the hospital a few weeks ago for gastroenteritis  No new creams or body hygiene products.  No other exposure.  She has some nausea.  She has never had the shingles vaccine.  No other aggravating or relieving factors.    No other c/o.  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 Benson [place]   Ischemic colitis ([place]) 11/21/2018   no current problems  per patient on [id number]   Lichenoid keratosis [id number]   Dr.Stinehelfer   Ocular migraine    Osteoporosis 10/11   Dr.Hawkes   Panic attack    Parkinson disease ([place])    Parkinson's disease ([place]) 06/23/2016   Recurrent UTI    has cystocele-Dr.Grewal   Shingles 1999   h/o   Superficial thrombophlebitis 03/2009   RLE   Trochanteric bursitis 12/2008   bilateral   Current Outpatient Medications on File Prior to Visit  Medication Sig Dispense Refill   ALPRAZolam (XANAX) 0.5 MG tablet Take four tablets daily as needed for anxiety. 120 tablet 2   b complex vitamins tablet Take 1 tablet by mouth daily.     Calcium Carbonate (CALCIUM 600 PO) Take 2 capsules by mouth in the morning and at bedtime.     Carbidopa-Levodopa ER (SINEMET CR) 25-100 MG tablet controlled release Take 2 po qAm, one po qPM and 2 po qHS 450 tablet 3   Cholecalciferol (VITAMIN D) 2000 UNITS tablet Take 2,000 Units by mouth daily.     Probiotic Product (ALIGN PO) Take 1 capsule by mouth daily.     SYNTHROID 25 MCG tablet TAKE 1 TABLET DAILY BEFORE BREAKFAST FOR HYPOTHYROIDISM 90 tablet 0   acetaminophen (TYLENOL) 650  MG CR tablet Take 650 mg by mouth every 8 (eight) hours as needed for pain. (Patient not taking: No sig reported)     dicyclomine (BENTYL) 10 MG capsule As needed (Patient not taking: No sig reported)     escitalopram (LEXAPRO) 5 MG tablet Take 1 tablet (5 mg total) by mouth daily. (Patient not taking: No sig reported) 30 tablet 5   ondansetron (ZOFRAN ODT) 4 MG disintegrating tablet Take 1 tablet (4 mg total) by mouth every 8 (eight) hours as needed for nausea or vomiting. (Patient not taking: No sig reported) 20 tablet 0   Current Facility-Administered Medications on File Prior to Visit  Medication Dose Route Frequency Provider Last Rate Last Admin   0.9 %  sodium chloride infusion  500 mL Intravenous Once Milus Banister, MD         The following portions of the patient's history were reviewed and  updated as appropriate: allergies, current medications, past family history, past medical history, past social history, past surgical history and problem list.  ROS Otherwise as in subjective above  Objective: BP 110/62   Pulse 69   Temp (!) 97.5 F (36.4 C)   Wt 99 lb 3.2 oz (45 kg)   LMP  (LMP Unknown)   SpO2 97%   BMI 20.04 kg/m   General appearance: alert, no distress, well developed, well nourished Anterior left breast with a round patch of erythema somewhat puffy but no obvious vesicle lesions.  The whole area is inflamed.  The rash diameter is approximately 4cm diameter along T5 dermatome, mid clavicular line.,  No other rash, no other deformity, no lumps or other significant findings   Assessment: Encounter Diagnoses  Name Primary?   Rash Yes   Herpes zoster without complication    Multiple allergies    Frail elderly      Plan: We discussed her symptoms and concern.  We discussed possible differential.  Given the symptoms and new rash and given recent stress from hospitalization I strongly suspect this is shingles.  There is no obvious vesicular lesions but the appearance and prior symptoms suggest this could be shingles  She has numerous drug allergies and is very sensitive to medications.  We discussed typical treatment for shingles.  She does not think she can swallow the large Valtrex tablets.  She does not want to take a high dose of what ever medicine we use today.  Thus we compromised with a lower dose.  I called and spoke to her pharmacist about options.  We will use acyclovir 400 mg 3 times a day instead of 800 mg 5 times daily for antiviral therapy.  She can use the cream below topically for the itching and burning sensation.  Do not use the cream on the face.  Use the cream short-term.  We discussed handwashing and good hygiene to avoid spread to others.  We discussed possible complications of shingles.  She wants to stick with Tylenol for pain.  I asked her to  call back in a week to update [place] on her symptoms.  Return sooner if needed  Vada was seen today for mass.  Diagnoses and all orders for this visit:  Rash  Herpes zoster without complication  Multiple allergies  Frail elderly  Other orders -     triamcinolone cream (KENALOG) 0.1 %; Apply 1 application topically 2 (two) times daily. -     acyclovir (ZOVIRAX) 200 MG/5ML suspension; Take 10 mLs (400 mg total) by mouth  3 (three) times daily.   Follow up: 1 week

## 2021-03-19 DIAGNOSIS — F332 Major depressive disorder, recurrent severe without psychotic features: Secondary | ICD-10-CM | POA: Diagnosis not present

## 2021-03-19 DIAGNOSIS — E063 Autoimmune thyroiditis: Secondary | ICD-10-CM | POA: Diagnosis not present

## 2021-03-19 DIAGNOSIS — M7542 Impingement syndrome of left shoulder: Secondary | ICD-10-CM | POA: Diagnosis not present

## 2021-03-19 DIAGNOSIS — G20 Parkinson's disease: Secondary | ICD-10-CM | POA: Diagnosis not present

## 2021-03-19 DIAGNOSIS — M810 Age-related osteoporosis without current pathological fracture: Secondary | ICD-10-CM | POA: Diagnosis not present

## 2021-03-19 DIAGNOSIS — E039 Hypothyroidism, unspecified: Secondary | ICD-10-CM | POA: Diagnosis not present

## 2021-03-19 DIAGNOSIS — K219 Gastro-esophageal reflux disease without esophagitis: Secondary | ICD-10-CM | POA: Diagnosis not present

## 2021-03-19 DIAGNOSIS — K581 Irritable bowel syndrome with constipation: Secondary | ICD-10-CM | POA: Diagnosis not present

## 2021-03-19 DIAGNOSIS — M7551 Bursitis of right shoulder: Secondary | ICD-10-CM | POA: Diagnosis not present

## 2021-03-19 DIAGNOSIS — G8929 Other chronic pain: Secondary | ICD-10-CM | POA: Diagnosis not present

## 2021-03-19 DIAGNOSIS — M5116 Intervertebral disc disorders with radiculopathy, lumbar region: Secondary | ICD-10-CM | POA: Diagnosis not present

## 2021-03-19 DIAGNOSIS — H409 Unspecified glaucoma: Secondary | ICD-10-CM | POA: Diagnosis not present

## 2021-03-19 DIAGNOSIS — F411 Generalized anxiety disorder: Secondary | ICD-10-CM | POA: Diagnosis not present

## 2021-03-19 DIAGNOSIS — Z87440 Personal history of urinary (tract) infections: Secondary | ICD-10-CM | POA: Diagnosis not present

## 2021-03-19 DIAGNOSIS — I700 Atherosclerosis of aorta: Secondary | ICD-10-CM | POA: Diagnosis not present

## 2021-03-19 DIAGNOSIS — M7062 Trochanteric bursitis, left hip: Secondary | ICD-10-CM | POA: Diagnosis not present

## 2021-03-19 DIAGNOSIS — M7061 Trochanteric bursitis, right hip: Secondary | ICD-10-CM | POA: Diagnosis not present

## 2021-03-19 DIAGNOSIS — K559 Vascular disorder of intestine, unspecified: Secondary | ICD-10-CM | POA: Diagnosis not present

## 2021-03-21 ENCOUNTER — Telehealth: Payer: Self-pay | Admitting: Adult Health

## 2021-03-21 NOTE — Telephone Encounter (Signed)
[name]  the daughter called and said that Suprina tried the lexapro for 5 days. She had tingling and swollen lipps as a reaction to the medicine. All of these symptoms have gone away. They want to know when she can go back on the prozac. Please call lisa at [phone]

## 2021-03-21 NOTE — Telephone Encounter (Signed)
Please advise 

## 2021-03-22 ENCOUNTER — Telehealth: Payer: Self-pay | Admitting: Adult Health

## 2021-03-22 ENCOUNTER — Ambulatory Visit (INDEPENDENT_AMBULATORY_CARE_PROVIDER_SITE_OTHER): Payer: Medicare Other | Admitting: Psychiatry

## 2021-03-22 ENCOUNTER — Telehealth: Payer: Self-pay | Admitting: Family Medicine

## 2021-03-22 DIAGNOSIS — F331 Major depressive disorder, recurrent, moderate: Secondary | ICD-10-CM | POA: Diagnosis not present

## 2021-03-22 NOTE — Telephone Encounter (Signed)
Pt informed

## 2021-03-22 NOTE — Telephone Encounter (Signed)
We can retry the Prozac if she would like.

## 2021-03-22 NOTE — Telephone Encounter (Signed)
Yes

## 2021-03-22 NOTE — Telephone Encounter (Signed)
Tracy Malone stated [facility] stopped taking the lexapro due to the reaction.She said at the last appt you discussed viibryd as a back up plan,but they can not afford this med.That's why she asked about the prozac.

## 2021-03-22 NOTE — Telephone Encounter (Signed)
Informed Tracy Malone she says she still has a rx for it

## 2021-03-22 NOTE — Progress Notes (Signed)
[facility] Counselor/Therapist Progress Note  Patient ID: Tracy Malone, MRN: [id number],    Date: 03/22/2021  Time Spent: 50 minutes   Virtual Visit via Telephone Note: TELEPHONE ONLY Note as patient is not physically able to do Video session today Connected with patient by a video enabled telemedicine/telehealth application or telephone, with their informed consent, and verified patient privacy and that I am speaking with the correct person using two identifiers. I discussed the limitations, risks, security and privacy concerns of performing psychotherapy and management service by telephone and the availability of in person appointments. I also discussed with the patient that there may be a patient responsible charge related to this service. The patient expressed understanding and agreed to proceed. I discussed the treatment planning with the patient. The patient was provided an opportunity to ask questions and all were answered. The patient agreed with the plan and demonstrated an understanding of the instructions. The patient was advised to call  our office if  symptoms worsen or feel they are in a crisis state and need immediate contact.   Therapist Location: [facility] Patient Location: home    Treatment Type: Individual Therapy  Reported Symptoms: depression, anxiety, self-defeating talk, tearfulness  Mental Status Exam:  Appearance:   N/a   telehealth      Behavior:  Sharing  Motor:  Uses a rollator  Speech/Language:   Clear and Coherent  Affect:  N/a   telehealth  Mood:  anxious and depressed  Thought process:  Some tangentiality  Thought content:    Some rumination  Sensory/Perceptual disturbances:    WNL  Orientation:  oriented to person, place, time/date, situation, day of week, month of year, year, and stated date of Aug. 30, 2022  Attention:  Fair  Concentration:  Fair  Memory:  Martin of knowledge:   Good  Insight:    Fair  Judgment:   Good  and Fair  Impulse Control:  Good   Risk Assessment: Danger to Self:  No Self-injurious Behavior: No Danger to Others: No Duty to Warn:no Physical Aggression / Violence:No  Access to Firearms a concern: No  Gang Involvement:No   Subjective: Patient today reporting more depression recently, in addition to anxiety, negative thinking, negative self-talk, hard to see the positives.  This is an ongoing struggle for patient and apparently her depression has been off and on for years before she came to [facility] for therapy and meds.  Has had shingles recently but is reportedly getting better from that.  Needed the session today to vent and share a lot of her concerns and get support as well as some redirection toward some of her goal-directed behaviors.  Did do some good work today on trying to reframe some of her depressive/anxious/negative thoughts and challenge them and replace with some thoughts that are more realistic and not anxiety producing.  Patient able to do this exercise but the difficulty is holding on to the more realistic and positive thoughts.  We discussed how "letting go" will be a key factor in this work for her.  She agrees to work on this between now and next session and will share at that time how this has been for her.   Interventions: Solution-Oriented/Positive Psychology and Ego-Supportive  Diagnosis:   ICD-10-CM   1. Major depressive disorder, recurrent episode, moderate (HCC)  F33.1        Treatment Goal Plan:  Patient not signing treatment plan on computer screen due to [place].  Treatment Goals: Treatment goals will remain on treatment plan as patient works with strategies to achieve her goals.  Progress will be noted each session and documented in the "progress" section of note. Long term goal: Develop the ability to recognize, accept, and cope with feelings of depression. Short term goal: Verbalize any unresolved grief issues that may be contributing to  depression. Strategy: Replace negative self-defeating self talk with verbalization of realistic and positive cognitive messages to lessen depression and improve mood.   Plan: Patient today showing some motivation but less than last session.  Her motivation did show a little bit of increase at times during session and she did follow through and some of the work we are doing especially about "letting go" of some things in previous times and working to be able to see more positives than negatives, realizing how the negatives and dwelling on them prevents her from more happiness and peace of mind.  Family has also been concerned about her dwelling on the negatives.  Patient denies any SI.  Encouraged her to practice behaviors that have proven to be helpful in the past including: Allowing for good sleep patterns, healthy nutrition, moving about carefully as she is able per her doctor, consistent positive self talk, staying in the present focusing on what she can control, staying in touch with family and friends who are supportive, staying in touch with her church members, being open to interacting more with neighbors as she is able, remain on her medications as prescribed, follow up on homework from this session including her positives list to help with redirecting her thoughts, and to recognize the strength she is showing in working with goal-directed behaviors as she tries to move forward towards improved emotional health and stability.  Goal review and progress/challenges noted with patient.  Next appointment within 3 weeks.   Shanon Ace, LCSW

## 2021-03-22 NOTE — Telephone Encounter (Signed)
Reviewed

## 2021-03-22 NOTE — Telephone Encounter (Signed)
Call daughter for specifics. Going back on Prozac was not discussed.

## 2021-03-22 NOTE — Telephone Encounter (Signed)
Pt is going back on Prozac and wants to know if ok for her to take prozac with the acyclovir  Please call

## 2021-03-22 NOTE — Telephone Encounter (Signed)
Pt called stating she would like to go back on Prozac. If she starts having stomach issues, is it safe to take mylanta with prozac.. Advise Pt @ [phone].

## 2021-03-22 NOTE — Telephone Encounter (Signed)
Noted  

## 2021-03-22 NOTE — Telephone Encounter (Signed)
Pt wants to know can she take Mylanta with prozac if she has stomach issues

## 2021-03-23 NOTE — Telephone Encounter (Signed)
Pt informed

## 2021-03-23 NOTE — Progress Notes (Signed)
Chief Complaint  Patient presents with   Follow-up    Follow up on rash, not any worse-somewhat better.     Patient saw Audelia Acton 8/26 with rash.  She had ongoing discomfort and burning over the L chest. Itching and burning had gotten more intense, and rash started. She denied fever, new body aches, chills.  He felt that the rash was dermatomal, and started her on acyclovir.  She was only willing to take low dose, taking '400mg'$  TID rather than '800mg'$  5x/d. She was also prescribed TAC 0.1% for the rash. She developed nausea from the acyclovir, cut it back to just twice/day.   She elected to delay starting the prozac to avoid confusion in side effects. The ointment burns some, applies with a brush.  She feels the rash is improving, and the burning has resolved. She states it never really was itchy.  She continues to be very depressed.  Really wants to hug her son.  He is in [place], and will be going to halfway house in November.  He sent her something recently, and talks to her.  She is frustrated that she can't see/hug him, and frustrated with her depression, not being able to do things that she could when she was younger.   PM[facility], [facility] reviewed  Outpatient Encounter Medications as of 03/24/2021  Medication Sig Note   acyclovir (ZOVIRAX) 200 MG/5ML suspension Take 10 mLs (400 mg total) by mouth 3 (three) times daily. 03/24/2021: Taking BID   ALPRAZolam (XANAX) 0.5 MG tablet Take four tablets daily as needed for anxiety.    b complex vitamins tablet Take 1 tablet by mouth daily.    Carbidopa-Levodopa ER (SINEMET CR) 25-100 MG tablet controlled release Take 2 po qAm, one po qPM and 2 po qHS    Cholecalciferol (VITAMIN D) 2000 UNITS tablet Take 2,000 Units by mouth daily.    Probiotic Product (ALIGN PO) Take 1 capsule by mouth daily.    SYNTHROID 25 MCG tablet TAKE 1 TABLET DAILY BEFORE BREAKFAST FOR HYPOTHYROIDISM    triamcinolone cream (KENALOG) 0.1 % Apply 1 application topically 2 (two) times daily.     acetaminophen (TYLENOL) 650 MG CR tablet Take 650 mg by mouth every 8 (eight) hours as needed for pain. (Patient not taking: No sig reported) 03/10/2021: Prn none today   Calcium Carbonate (CALCIUM 600 PO) Take 2 capsules by mouth in the morning and at bedtime. (Patient not taking: Reported on 03/24/2021)    dicyclomine (BENTYL) 10 MG capsule As needed (Patient not taking: No sig reported) 03/10/2021: Prn none today   escitalopram (LEXAPRO) 5 MG tablet Take 1 tablet (5 mg total) by mouth daily. (Patient not taking: No sig reported)    ondansetron (ZOFRAN ODT) 4 MG disintegrating tablet Take 1 tablet (4 mg total) by mouth every 8 (eight) hours as needed for nausea or vomiting. (Patient not taking: No sig reported) 03/10/2021: Has not picked it up yet   Facility-Administered Encounter Medications as of 03/24/2021  Medication   0.9 %  sodium chloride infusion   Taking acyclovir BID  Allergies  Allergen Reactions   Iodine Anaphylaxis    IV and topical forms. Other reaction(s): Unknown   Levsin [Hyoscyamine Sulfate]     Vision problems/pt has glaucoma   Salmon [Fish Allergy] Hives and Shortness Of Breath   Shellfish Allergy Anaphylaxis   Tramadol Swelling   Remeron [Mirtazapine] Other (See Comments)    Cause blurred vision and red eyes, pt has glaucoma   Aspirin  Other (See Comments)    Sever stomach pain due to ulcer scaring.   Bis Subcit-Metronid-Tetracyc Swelling    Tongue swelling. Face tingling Other reaction(s): Unknown   Cephalexin Hives    Other reaction(s): hives   Ciprofloxacin Diarrhea   Codeine Nausea And Vomiting   Contrast Media [Iodinated Diagnostic Agents]    Cyclobenzaprine Other (See Comments)    Tingly/prickly sensation. Other reaction(s): tingly/prickly sensation   Darvocet [Propoxyphene N-Acetaminophen] Nausea And Vomiting   Demerol [Meperidine] Nausea Only   Dexlansoprazole Swelling    Redness, swelling and peeling of both feet. Other reaction(s): foot pain    Diphedryl [Diphenhydramine] Other (See Comments)    Increased pulse/small amount ok   Doxycycline Hyclate Other (See Comments)    GI intolerance.   Doxycycline Hyclate     Other reaction(s): GI intolerance   Epinephrine Other (See Comments)    Breathing problems Other reaction(s): breathing problems/fainting   Erythromycin Other (See Comments)    GI intolerance. Other reaction(s): GI   Fish Oil     Other reaction(s): breathing problems/hives   Hyoscyamine     Other reaction(s): eye pain   Latex Other (See Comments)    Gloves ok.  Skin gets red from elastic in underwear and latex bandaides.   Meperidine Hcl     Other reaction(s): vomiting   Nitrofurantoin Diarrhea   Other     Other reaction(s): migraines Other reaction(s): Unknown Other reaction(s): Unknown Other reaction(s): Unknown Other reaction(s): increased pulse, faint, diarrhea   Prednisone Other (See Comments)    Headache Other reaction(s): headache   Ra Diphedryl Allergy [Diphenhydramine Hcl]     Other reaction(s): increased pulse small dose okay   Sertraline Hcl Swelling and Other (See Comments)    Migraine Swelling of tongue/lip (09/2012) Other reaction(s): Unknown   Shellfish-Derived Products     Other reaction(s): Unknown   Sulfa Antibiotics Other (See Comments)    Increased pulse, fainting, diarrhea, thrush   Wellbutrin [Bupropion] Other (See Comments)    Headaches   Xylocaine [Lidocaine Hcl]     With epinephrine, given by dentist.  Speeded up heart rate and she passed out (occured twice, at dentist)   Xylocaine [Lidocaine]     Other reaction(s): Unknown   Biaxin [Clarithromycin] Rash    Started after completing 10 day course of 2000 mg /day, Lips swelling   Ibuprofen Other (See Comments)    Motrin ok with a GI effect. Other reaction(s): rash Motrin okay with a GI effect    ROS:  no f/c.  No significant abdominal pain since prozac stopped.  Had tongue and lip swelling related to re-trying zoloft  recently.  Rash is improving. +depression. Tired today, plans to go home and take a nap. No URI complaints, shortness of breath or other concerns.  PHYSICAL EXAM:  BP 118/60   Pulse 60   Ht '4\' 11"'$  (1.499 m)   Wt 99 lb 3.2 oz (45 kg)   LMP  (LMP Unknown)   BMI 20.04 kg/m   Pleasant, elderly female, who appears very depressed, accompanied by her husband. She is alert, oriented, normal speech, hygiene, grooming, eye contact.  Left chest--directly above the areolar area, on the left breast, there is a cluster of 3-4 healing lesions.  There is erythema surrounding central area (slightly purplish in coloration in 2-3 of the lesions, centrally, possibly some fluid/vesicular, not scabbed), the one on the left has no central portion, just a small red papule). No significant overall erythema, just surrounding the lesions.  No other areas of rash, not extending elsewhere along the dermatome.  Heart: regular rate and rhythm Lungs: clear bilaterally Neuro: alert, oriented, slow/normal gait. +tremor, unchanged   ASSESSMENT/PLAN:  Herpes zoster without complication - only tolerating very low dose acyclovir, improving. Not dermatomal, more focal, but c/w HSV vs zoster. Complete 10d of med  Depression, recurrent ([place]) - Severe. Advised to restart prozac--no interaction with acyclovir, but could wait until finished. Counseled re: depression, gratitude, appreciating good things Counseled x 15 mins re: depression. Cont f/u with Rollene Fare and therapist, and to start prozac.

## 2021-03-24 ENCOUNTER — Ambulatory Visit (INDEPENDENT_AMBULATORY_CARE_PROVIDER_SITE_OTHER): Payer: Medicare Other | Admitting: Family Medicine

## 2021-03-24 ENCOUNTER — Other Ambulatory Visit: Payer: Self-pay

## 2021-03-24 ENCOUNTER — Encounter: Payer: Self-pay | Admitting: Family Medicine

## 2021-03-24 VITALS — BP 118/60 | HR 60 | Ht 59.0 in | Wt 99.2 lb

## 2021-03-24 DIAGNOSIS — B029 Zoster without complications: Secondary | ICD-10-CM

## 2021-03-24 DIAGNOSIS — F339 Major depressive disorder, recurrent, unspecified: Secondary | ICD-10-CM | POA: Diagnosis not present

## 2021-03-24 NOTE — Patient Instructions (Addendum)
Take the acyclovir through Sunday (10 days). Okay to stay on the lower dose if you can't tolerate it at 3 times per day.  Since the rash is no longer itchy, and improving, you probably can stop the ointment (which is causing some stinging/burning).  If the itching/burning and inflammation recur, you can restart it--but apply it only to the rash, not the surrounding normal skin (with a Q-tip).  Use cool compresses or icepack if needed for burning and discomfort. Hopefully this will continue to resolve (may take 2-3 weeks for the rash to fully resolve).

## 2021-03-25 ENCOUNTER — Other Ambulatory Visit: Payer: Medicare Other | Admitting: *Deleted

## 2021-03-25 VITALS — BP 109/66 | HR 63 | Temp 97.7°F | Resp 18

## 2021-03-25 DIAGNOSIS — Z515 Encounter for palliative care: Secondary | ICD-10-CM

## 2021-03-29 ENCOUNTER — Telehealth: Payer: Self-pay | Admitting: Adult Health

## 2021-03-29 NOTE — Progress Notes (Signed)
PATIENT NAME: Tracy Malone DOB: [birth year] MRN: [id number]  PRIMARY CARE PROVIDER: [name], MD  RESPONSIBLE PARTY: Tracy Malone (husband) Acct ID - Guarantor Home Phone Work Phone Relationship Acct Type  [id number] Tracy Malone, Tracy [phone] Self P/F     [address], [place], [place] [address]   Covid-19 Pre-screening Negative  PLAN OF CARE and INTERVENTION:  ADVANCE CARE PLANNING/GOALS OF CARE: Goal is for patient to remain at home with her husband. She wants to have better control over her depression and crying spells.  PATIENT/CAREGIVER EDUCATION: Symptom management, pain management, safe mobility DISEASE STATUS: Follow up palliative care visit made in patient's home. Met with patient and her husband, who is also a palliative care patient. Spenser's biggest concern is her increased depression causing frequent crying spells. She has tried several medications in the past for this, but says they caused her stomach to be upset. She says that she is going to try taking Prozac again starting over this weekend. She was advised that if she has stomach upset then she can try Mylanta to see if this helps. She really wants to have more control over her feelings of depression. She says she is not sleeping well or eating well. She appears more thin/frail and cheeks appear sunken in. She often doesn't feel hungry. She becomes frustrated and agitated more easily. She is taking her Xanax to help with her anxiety. She also spoke about having shingles on her left breast. She is currently taking liquid Acyclovir. The burning sensation has subsided. She never experienced any blistering as she felt the reason for this is catching it in time. She is receiving in home PT twice weekly through [facility] and feels this has been very beneficial. Will continue to monitor.   HISTORY OF PRESENT ILLNESS: This is a 87 yo female with a diagnosis of Parkinson's. She was seen in the ED on 03/07/21 for  increasing abdominal pain (left-sided), nausea, fever of 100 at home, chills, fatigue and head/body aches. Patient had stabilized and was released back to home. Palliative care continues to follow patient for symptom management, goals of care and complex decision making.  CODE STATUS: Full code ADVANCED DIRECTIVES: Y MOST FORM: no PPS: 60%   PHYSICAL EXAM:   VITALS: Today's Vitals   03/25/21 1321  BP: 109/66  Pulse: 63  Resp: 18  Temp: 97.7 F (36.5 C)  TempSrc: Temporal  SpO2: 98%  PainSc: 3   PainLoc: Hip    LUNGS: clear to auscultation  CARDIAC: Cor RRR EXTREMITIES: No edema SKIN:  Reddened area noted to left breast   NEURO:  Alert and oriented x 4, depressed mood, ambulatory, right hand tremor   (Duration of visit and documentation 45 minutes)   [name], RN BSN

## 2021-03-29 NOTE — Telephone Encounter (Signed)
Pt called reporting she is having trouble sleeping. She is so tired and can't sleep. She can only take certain meds. Can't take seroquel interaction to another med she is taking. Call pt @ [phone]. Send Rx to [facility] they deliver. Husband is feeling dizzy and can't drive.

## 2021-03-29 NOTE — Telephone Encounter (Signed)
Pt was tearful and stated she is falling apart because she can't sleep.Pt can't take seroqel because it interacts with cabidopa levodopa.I asked if she has tried trazadone and she said she can't take that either.she is requesting something that can help.

## 2021-03-30 ENCOUNTER — Encounter: Payer: Self-pay | Admitting: Adult Health

## 2021-03-30 NOTE — Telephone Encounter (Signed)
We can try Hydroxyzine '10mg'$  at bedtime.

## 2021-03-30 NOTE — Telephone Encounter (Signed)
Call patient to see if daughter has her list of sleep aids on the genesight testing.

## 2021-03-30 NOTE — Telephone Encounter (Signed)
I asked her if she could take trazodone yesterday and she stated she can't because it also interacts with carbidopa levodopa.

## 2021-03-30 NOTE — Telephone Encounter (Signed)
I don't prescribe Manuela Neptune - it is a muscle relaxer. We could offer Trazadone '25mg'$  at bedtime - I don't see that listed as an allergy.

## 2021-03-30 NOTE — Telephone Encounter (Signed)
Her daughter stated there is not a section listed for sleep meds.Talisha stated she has taken Carisoprodol before and it did help but she can only take 1/2 tab.Her husband has some she can take if you agree

## 2021-03-31 ENCOUNTER — Other Ambulatory Visit: Payer: Self-pay

## 2021-03-31 MED ORDER — HYDROXYZINE HCL 10 MG PO TABS
10.0000 mg | ORAL_TABLET | Freq: Every day | ORAL | 0 refills | Status: DC
Start: 2021-03-31 — End: 2021-04-28

## 2021-03-31 NOTE — Telephone Encounter (Signed)
So no history or reactions to the hydroxyzine?

## 2021-03-31 NOTE — Telephone Encounter (Signed)
Rx sent 

## 2021-03-31 NOTE — Telephone Encounter (Signed)
No she said she has not tried it before

## 2021-04-04 ENCOUNTER — Telehealth: Payer: Self-pay | Admitting: Adult Health

## 2021-04-04 ENCOUNTER — Other Ambulatory Visit: Payer: Self-pay

## 2021-04-04 DIAGNOSIS — F411 Generalized anxiety disorder: Secondary | ICD-10-CM

## 2021-04-04 NOTE — Telephone Encounter (Signed)
Cancelled and pended  

## 2021-04-04 NOTE — Telephone Encounter (Signed)
Is she taking all 4 of these daily?

## 2021-04-04 NOTE — Telephone Encounter (Signed)
Error

## 2021-04-04 NOTE — Telephone Encounter (Signed)
Pt wants the xanax script to be sent to [facility] and not [facility]. [facility]. Please cancel and resubmit

## 2021-04-05 ENCOUNTER — Ambulatory Visit (INDEPENDENT_AMBULATORY_CARE_PROVIDER_SITE_OTHER): Payer: Medicare Other | Admitting: Psychiatry

## 2021-04-05 DIAGNOSIS — F411 Generalized anxiety disorder: Secondary | ICD-10-CM

## 2021-04-05 DIAGNOSIS — K8020 Calculus of gallbladder without cholecystitis without obstruction: Secondary | ICD-10-CM | POA: Diagnosis not present

## 2021-04-05 NOTE — Progress Notes (Signed)
[facility] Counselor/Therapist Progress Note  Patient ID: Tracy Malone, MRN: [id number],    Date: 04/05/2021  Time Spent: 45 minutes     Virtual Visit via Telephone Note: Telephone Only (patient not able physically to do video session) Connected with patient by a video enabled telemedicine/telehealth application or telephone, with their informed consent, and verified patient privacy and that I am speaking with the correct person using two identifiers. I discussed the limitations, risks, security and privacy concerns of performing psychotherapy and management service by telephone and the availability of in person appointments. I also discussed with the patient that there may be a patient responsible charge related to this service. The patient expressed understanding and agreed to proceed. I discussed the treatment planning with the patient. The patient was provided an opportunity to ask questions and all were answered. The patient agreed with the plan and demonstrated an understanding of the instructions. The patient was advised to call  our office if  symptoms worsen or feel they are in a crisis state and need immediate contact.   Therapist Location: [facility] Patient Location: home   Treatment Type: Individual Therapy  Reported Symptoms: anxiety, depression, very tearful re: sister  Mental Status Exam:  Appearance:   N/a  telehealth      Behavior:  Sharing  Motor:  N/a  telehealth  Speech/Language:   Clear but some difficulty at times hearing her due to tearfulness off and on during appt  Affect:  N/a  telehealth  Mood:  anxious, depressed, and sad  Thought process:  Some tangentiality  Thought content:    Rumination  Sensory/Perceptual disturbances:    WNL  Orientation:  oriented to person, place, time/date, situation, day of week, month of year, year, and stated date of Sept 13, 2022  Attention:  Fair  Concentration:  Fair  Memory:  [place] of knowledge:    Good  Insight:    Fair  Judgment:   Good and Fair  Impulse Control:  Good   Risk Assessment: Danger to Self:  No Self-injurious Behavior: No Danger to Others: No Duty to Warn:no Physical Aggression / Violence:No  Access to Firearms a concern: No  Gang Involvement:No   Subjective:  Patient today was continuously crying from the beginning of session.  Focused mostly on helping her vent some emotions so she could finally talk and be understood, however she was in and out of tears for over half of the session but was able to feel some more calm and grounded towards the last part of session.  Discussed her fears and concerns about her own health issues and also her 85 year old sister in [place] who fell recently and broke a hip.  Patient herself had just returned from a doctor's appointment earlier today where she actually got encouraging news that she would not need any surgery regarding her gallbladder.  Most of session was needed today helping patient vent her emotions but also eventually calm herself some and begin to not just look at what might go wrong and imagining worst case scenarios.  She has struggled with this tendency before so whenever something happens it exacerbates that thought process.  She was able to finally be more calm and not assume all the negatives that she had been worried about earlier.  Supported her in her fears and anxieties but also tried to offer a more leveled out view and calling attention to some positives as far as helpful people around and also  her not having to have surgery, and her sister being able to be discharged from the hospital.  Patient agreed to follow up with some of the self-care that we discussed earlier in session including trying to get enough sleep, staying on her medications as prescribed, trying more positive self talk, and eating as healthy as possible especially with her medications and also to help maintain strength.  I did confirm that a  daughter is at the home with her later today as well as her husband so she would not be alone.  Was not voicing any Thoughts of harming herself at all but had just been very upset earlier and has remained calmer before ending our telehealth session today.  She is scheduled again within 2 weeks and knows that she can call before that time if needed and her husband is aware that also.  Interventions: Solution-Oriented/Positive Psychology, Ego-Supportive, and Insight-Oriented  Diagnosis:   ICD-10-CM   1. Generalized anxiety disorder  F41.1       Treatment Goal Plan:  Patient not signing treatment plan on computer screen due to [place]. Treatment Goals: Treatment goals will remain on treatment plan as patient works with strategies to achieve her goals.  Progress will be noted each session and documented in the "progress" section of note. Long term goal: Develop the ability to recognize, accept, and cope with feelings of depression. Short term goal: Verbalize any unresolved grief issues that may be contributing to depression. Strategy: Replace negative self-defeating self talk with verbalization of realistic and positive cognitive messages to lessen depression and improve mood.    Plan: Patient today showing some motivation but due to being quite upset during a good part of the session, it was hard to maintain consistent motivation but she did stay involved in the session as noted above.  Worked with patient as she shared her tears and fears in reference to her own health and her 85 year old sister's health up in [place], as noted above.  She was able to eventually remain some calmer and complete the session and is scheduled again within 2 weeks, understanding that she can call if needed before that time and her husband understands that as well.  Encouraged patient to practice positive behaviors including: Healthy nutrition, remaining on her medications as prescribed, moving about carefully as  she is able per her doctor, allowing for good sleep patterns, consistent positive self talk, staying in the present focusing on what she can control versus cannot, remaining in touch with family and friends who are supportive, staying in touch with church members that want to be supportive, being open to interacting more with neighbors as she is able, recall the things we discussed in reference to her list of positives that help her in redirecting her thoughts when she is struggling, and to feel good about the strength she is showing as she works with goal-directed behaviors in trying to move forward towards improved emotional health and stability.  Goal review and progress/challenges noted with patient.  Next appointment within 2 to 3 weeks.   Shanon Ace, LCSW

## 2021-04-12 ENCOUNTER — Other Ambulatory Visit: Payer: Self-pay | Admitting: Adult Health

## 2021-04-12 DIAGNOSIS — F411 Generalized anxiety disorder: Secondary | ICD-10-CM

## 2021-04-12 MED ORDER — ALPRAZOLAM 0.5 MG PO TABS: ORAL_TABLET | ORAL | 2 refills | Status: DC

## 2021-04-12 NOTE — Telephone Encounter (Signed)
Pt stated yes she is taking 4 a day and she is still not able to get any sleep

## 2021-04-12 NOTE — Telephone Encounter (Signed)
Noted  

## 2021-04-12 NOTE — Telephone Encounter (Signed)
I refused because when I called her she told me she got the Rx already

## 2021-04-12 NOTE — Telephone Encounter (Signed)
Is she taking 4 of these a day?

## 2021-04-13 ENCOUNTER — Telehealth: Payer: Self-pay | Admitting: Family Medicine

## 2021-04-13 DIAGNOSIS — M7551 Bursitis of right shoulder: Secondary | ICD-10-CM | POA: Diagnosis not present

## 2021-04-13 DIAGNOSIS — M5116 Intervertebral disc disorders with radiculopathy, lumbar region: Secondary | ICD-10-CM | POA: Diagnosis not present

## 2021-04-13 DIAGNOSIS — M7061 Trochanteric bursitis, right hip: Secondary | ICD-10-CM | POA: Diagnosis not present

## 2021-04-13 DIAGNOSIS — M7062 Trochanteric bursitis, left hip: Secondary | ICD-10-CM | POA: Diagnosis not present

## 2021-04-13 DIAGNOSIS — F411 Generalized anxiety disorder: Secondary | ICD-10-CM | POA: Diagnosis not present

## 2021-04-13 DIAGNOSIS — G20 Parkinson's disease: Secondary | ICD-10-CM | POA: Diagnosis not present

## 2021-04-13 NOTE — Progress Notes (Signed)
  Chronic Care Management   Note  04/13/2021 Name: Tracy Malone MRN: [id number] DOB: [birth year]  Tracy Malone is a 87 y.o. year old female who is a primary care patient of [name], MD. I reached out to [name] by phone today in response to a referral sent by Tracy Malone PCP, [name], MD.   Tracy Malone was given information about Chronic Care Management services today including:  CCM service includes personalized support from designated clinical staff supervised by her physician, including individualized plan of care and coordination with other care providers 24/7 contact phone numbers for assistance for urgent and routine care needs. Service will only be billed when office clinical staff spend 20 minutes or more in a month to coordinate care. Only one practitioner may furnish and bill the service in a calendar month. The patient may stop CCM services at any time (effective at the end of the month) by phone call to the office staff.   Patient agreed to services and verbal consent obtained.   Follow up plan:   Tatjana Secretary/administrator

## 2021-04-13 NOTE — Progress Notes (Signed)
Chief Complaint  Patient presents with   Consult    Wakes up in the middle of the night with hot burning hands that goes up in to her arms and sometimes up in to her armpit-for about a month. Started taking prozac about 3 weeks ago, wonders if it could be from this. Patient was sobbing and wanted to wait to talk to you about flu shot.   Patient presents accompanied by her daughter [name]. Her chief complaint is that she is waking up with her hands red, burning, occurring on both hands (palms), sometimes feels the warmth up to arms, sometimes to her armpits. Feels a little sweaty. Lasts 30-45 mins, and comes back during the day. She feels warm when this occurs.  No other associated symptoms (chest pain, dizziness, GI complaints).   She also reports that hr feet are red in am, though aren't hot. They apparently keep their thermostat in their home set at 76-77.    She wonders if this is related to her recent shingles (which was left sided).  She reports still having some mild discomfort, the 3 lesions are healing, but not yet resolved.  Pt has hypothyroidism.  Recent TSH was normal.   Lab Results  Component Value Date   TSH 1.530 03/10/2021   She and her husband both haven't been eating very well (depressed, decreased appetites), both have lost some weight.  She has tried taking Boost--has milk, bothering her stomach, even when she uses a lactaid tablet beforehand. "I can't eat"--feels full, no appetite. Constipated from meds She reports she is staying well hydrated, drinking 8-10 glasses of water day.  She is under the care of psych, and was restarted on Prozac 3 weeks ago.  The daughter brings up that she thinks that Parkinsons and aging are affecting her gut more than prozac (pt always blames the medications), and anxiety.  She has had increased anxiety, related to husband's recent issues with dizziness, and her sister's health issues.  She continues to have some LBP, pain L side mainly now  just with deep breaths.  PMH, PSH, SH reviewed  Outpatient Encounter Medications as of 04/14/2021  Medication Sig Note   ALPRAZolam (XANAX) 0.5 MG tablet Take four tablets daily as needed for anxiety.    b complex vitamins tablet Take 1 tablet by mouth daily.    Carbidopa-Levodopa ER (SINEMET CR) 25-100 MG tablet controlled release Take 2 po qAm, one po qPM and 2 po qHS    Cholecalciferol (VITAMIN D) 2000 UNITS tablet Take 2,000 Units by mouth daily.    FLUoxetine (PROZAC) 10 MG capsule Prozac 10 mg capsule 04/14/2021: Did not take today   hydrOXYzine (ATARAX/VISTARIL) 10 MG tablet Take 1 tablet (10 mg total) by mouth at bedtime. (Patient not taking: Reported on 04/14/2021)    Probiotic Product (ALIGN PO) Take 1 capsule by mouth daily.    SYNTHROID 25 MCG tablet TAKE 1 TABLET DAILY BEFORE BREAKFAST FOR HYPOTHYROIDISM    acetaminophen (TYLENOL) 650 MG CR tablet Take 650 mg by mouth every 8 (eight) hours as needed for pain. (Patient not taking: No sig reported)    acyclovir (ZOVIRAX) 200 MG/5ML suspension Take 10 mLs (400 mg total) by mouth 3 (three) times daily. (Patient not taking: Reported on 04/14/2021) 03/24/2021: Taking BID   Calcium Carbonate (CALCIUM 600 PO) Take 2 capsules by mouth in the morning and at bedtime. (Patient not taking: No sig reported)    dicyclomine (BENTYL) 10 MG capsule As needed (Patient not taking:  No sig reported)    ondansetron (ZOFRAN ODT) 4 MG disintegrating tablet Take 1 tablet (4 mg total) by mouth every 8 (eight) hours as needed for nausea or vomiting. (Patient not taking: No sig reported) 03/10/2021: Has not picked it up yet   triamcinolone cream (KENALOG) 0.1 % Apply 1 application topically 2 (two) times daily. (Patient not taking: Reported on 04/14/2021)    [DISCONTINUED] escitalopram (LEXAPRO) 5 MG tablet Take 1 tablet (5 mg total) by mouth daily. (Patient not taking: No sig reported)    Facility-Administered Encounter Medications as of 04/14/2021  Medication   0.9  %  sodium chloride infusion   Allergies  Allergen Reactions   Iodinated Diagnostic Agents Anaphylaxis   Iodine Anaphylaxis    IV and topical forms. Other reaction(s): Unknown   Levsin [Hyoscyamine Sulfate]     Vision problems/pt has glaucoma   [place] [Fish Allergy] Hives and Shortness Of Breath   Shellfish Allergy Anaphylaxis   Tramadol Swelling   Remeron [Mirtazapine] Other (See Comments)    Cause blurred vision and red eyes, pt has glaucoma   Aspirin Other (See Comments)    Sever stomach pain due to ulcer scaring.   Bis Subcit-Metronid-Tetracyc Swelling    Tongue swelling. Face tingling Other reaction(s): Unknown   Cephalexin Hives    Other reaction(s): hives   Ciprofloxacin Diarrhea   Codeine Nausea And Vomiting   Cyclobenzaprine Other (See Comments)    Tingly/prickly sensation. Other reaction(s): tingly/prickly sensation   Darvocet [Propoxyphene N-Acetaminophen] Nausea And Vomiting   Demerol [Meperidine] Nausea Only   Dexlansoprazole Swelling    Redness, swelling and peeling of both feet. Other reaction(s): foot pain   Diphedryl [Diphenhydramine] Other (See Comments)    Increased pulse/small amount ok   Doxycycline Hyclate Other (See Comments)    GI intolerance.   Doxycycline Hyclate     Other reaction(s): GI intolerance   Epinephrine Other (See Comments)    Breathing problems Other reaction(s): breathing problems/fainting   Erythromycin Other (See Comments)    GI intolerance. Other reaction(s): GI   Fish Oil     Other reaction(s): breathing problems/hives   Hyoscyamine     Other reaction(s): eye pain   Latex Other (See Comments)    Gloves ok.  Skin gets red from elastic in underwear and latex bandaides.   Meperidine Hcl     Other reaction(s): vomiting   Nitrofurantoin Diarrhea   Other     Other reaction(s): migraines Other reaction(s): Unknown Other reaction(s): Unknown Other reaction(s): Unknown Other reaction(s): increased pulse, faint, diarrhea    Prednisone Other (See Comments)    Headache Other reaction(s): headache   Ra Diphedryl Allergy [Diphenhydramine Hcl]     Other reaction(s): increased pulse small dose okay   Sertraline Hcl Swelling and Other (See Comments)    Migraine Swelling of tongue/lip (09/2012) Other reaction(s): Unknown   Shellfish-Derived Products     Other reaction(s): Unknown   Sulfa Antibiotics Other (See Comments)    Increased pulse, fainting, diarrhea, thrush   Wellbutrin [Bupropion] Other (See Comments)    Headaches   Xylocaine [Lidocaine Hcl]     With epinephrine, given by dentist.  Speeded up heart rate and she passed out (occured twice, at dentist)   Xylocaine [Lidocaine]     Other reaction(s): Unknown   Biaxin [Clarithromycin] Rash    Started after completing 10 day course of 2000 mg /day, Lips swelling   Ibuprofen Other (See Comments)    Motrin ok with a GI effect. Other reaction(s):  rash Motrin okay with a GI effect   ROS:  no known fever.  +warmth, and red hands/feet per HPI.  +depression, anxiety, decreased appetite, weight loss, constipation, LBP and L side pain per HPI. Denies urinary complaints, other rashes (just healing shingles lesions).  PHYSICAL EXAM:  BP 132/70   Pulse 64   Ht 4\' 11"  (1.499 m)   Wt 97 lb 12.8 oz (44.4 kg)   LMP  (LMP Unknown)   BMI 19.75 kg/m   Wt Readings from Last 3 Encounters:  04/14/21 97 lb 12.8 oz (44.4 kg)  03/24/21 99 lb 3.2 oz (45 kg)  03/18/21 99 lb 3.2 oz (45 kg)   Elderly, thin, frail female, who appears depressed, in no acute distress HEENT: conjunctiva and sclera are clear, EOMI, wearing mask Neck; No lymphadenopathy, thyromegaly or mass Heart: regular rate and rhythm Lungs: clear bilaterally Chest: superior portion of L breast -3 healing lesions, without erythema or swelling. Abdomen: soft, nontender, nondistended, no mass Back: no spinal or CVA tenderness.  She is tender at bilateral SI joints Extremities: no edema.  2+ pulses. Brisk  capillary refill.  No palmar erythema noted. Neuro: she is alert and oriented, normal gait (slow).  +tremor, unchanged Psych: appears depressed, flattened affect. Normal hygiene, grooming, eye contact and speech.   ASSESSMENT/PLAN:   Palmar erythema - intermittent, sounds as though when feeling warm all over. May be related to cooling--discussed lowering thermostat, cool hands when occurs.  Need for influenza vaccination - Plan: Flu Vaccine QUAD High Dose(Fluad)  Protein-calorie malnutrition, unspecified severity ([place]) - Pt has lost weight, is frail, has decreased appetite related to depression. Encouraged adequate caloric intake, eating every 2 hours.  Parkinson disease ([place]) - per neuro.  She has some difficulty tolerating the medications.  Chest wall pain - related to recent shingles, improving  Calculus of gallbladder without cholecystitis without obstruction - asymptomatic.  Will let us know if she develops any RUQ pain  Depression, recurrent ([place]) - severe, under the care of psych and therapist. Given a small gift of book called "Gratitude"  Chronic bilateral low back pain without sciatica - multifactoria, currently mainly at SI joints.  She is getting PT, pain is tolerable.

## 2021-04-14 ENCOUNTER — Ambulatory Visit (INDEPENDENT_AMBULATORY_CARE_PROVIDER_SITE_OTHER): Payer: Medicare Other | Admitting: Family Medicine

## 2021-04-14 ENCOUNTER — Other Ambulatory Visit: Payer: Self-pay

## 2021-04-14 ENCOUNTER — Encounter: Payer: Self-pay | Admitting: Family Medicine

## 2021-04-14 VITALS — BP 132/70 | HR 64 | Ht 59.0 in | Wt 97.8 lb

## 2021-04-14 DIAGNOSIS — E46 Unspecified protein-calorie malnutrition: Secondary | ICD-10-CM

## 2021-04-14 DIAGNOSIS — L538 Other specified erythematous conditions: Secondary | ICD-10-CM | POA: Diagnosis not present

## 2021-04-14 DIAGNOSIS — M545 Low back pain, unspecified: Secondary | ICD-10-CM | POA: Diagnosis not present

## 2021-04-14 DIAGNOSIS — G20 Parkinson's disease: Secondary | ICD-10-CM | POA: Diagnosis not present

## 2021-04-14 DIAGNOSIS — G8929 Other chronic pain: Secondary | ICD-10-CM | POA: Diagnosis not present

## 2021-04-14 DIAGNOSIS — F339 Major depressive disorder, recurrent, unspecified: Secondary | ICD-10-CM

## 2021-04-14 DIAGNOSIS — Z23 Encounter for immunization: Secondary | ICD-10-CM | POA: Diagnosis not present

## 2021-04-14 DIAGNOSIS — R0789 Other chest pain: Secondary | ICD-10-CM

## 2021-04-14 DIAGNOSIS — K8020 Calculus of gallbladder without cholecystitis without obstruction: Secondary | ICD-10-CM

## 2021-04-14 DIAGNOSIS — G20A1 Parkinson's disease without dyskinesia, without mention of fluctuations: Secondary | ICD-10-CM

## 2021-04-14 NOTE — Patient Instructions (Addendum)
Check temperature when you feel very warm (when hands are red). Keep a bowl of water with a washcloth nearby to cool your hands down (ie soak for 15 minutes rather than just running under cool water at the sink).   Try increasing fiber in your diet, and get some walking around the house in throughout the day.  This should help your bowels.  You are doing very well with drinking water. Consider stool softener (colace) if stools remain hard.

## 2021-04-15 DIAGNOSIS — M7551 Bursitis of right shoulder: Secondary | ICD-10-CM | POA: Diagnosis not present

## 2021-04-15 DIAGNOSIS — G20 Parkinson's disease: Secondary | ICD-10-CM | POA: Diagnosis not present

## 2021-04-15 DIAGNOSIS — M7062 Trochanteric bursitis, left hip: Secondary | ICD-10-CM | POA: Diagnosis not present

## 2021-04-15 DIAGNOSIS — M5116 Intervertebral disc disorders with radiculopathy, lumbar region: Secondary | ICD-10-CM | POA: Diagnosis not present

## 2021-04-15 DIAGNOSIS — F411 Generalized anxiety disorder: Secondary | ICD-10-CM | POA: Diagnosis not present

## 2021-04-15 DIAGNOSIS — M7061 Trochanteric bursitis, right hip: Secondary | ICD-10-CM | POA: Diagnosis not present

## 2021-04-17 ENCOUNTER — Encounter: Payer: Self-pay | Admitting: Family Medicine

## 2021-04-18 ENCOUNTER — Telehealth: Payer: Self-pay | Admitting: Adult Health

## 2021-04-18 DIAGNOSIS — M7062 Trochanteric bursitis, left hip: Secondary | ICD-10-CM | POA: Diagnosis not present

## 2021-04-18 DIAGNOSIS — M7061 Trochanteric bursitis, right hip: Secondary | ICD-10-CM | POA: Diagnosis not present

## 2021-04-18 DIAGNOSIS — M5116 Intervertebral disc disorders with radiculopathy, lumbar region: Secondary | ICD-10-CM | POA: Diagnosis not present

## 2021-04-18 DIAGNOSIS — H409 Unspecified glaucoma: Secondary | ICD-10-CM | POA: Diagnosis not present

## 2021-04-18 DIAGNOSIS — G20 Parkinson's disease: Secondary | ICD-10-CM | POA: Diagnosis not present

## 2021-04-18 DIAGNOSIS — Z87440 Personal history of urinary (tract) infections: Secondary | ICD-10-CM | POA: Diagnosis not present

## 2021-04-18 DIAGNOSIS — F411 Generalized anxiety disorder: Secondary | ICD-10-CM | POA: Diagnosis not present

## 2021-04-18 DIAGNOSIS — M7542 Impingement syndrome of left shoulder: Secondary | ICD-10-CM | POA: Diagnosis not present

## 2021-04-18 DIAGNOSIS — K581 Irritable bowel syndrome with constipation: Secondary | ICD-10-CM | POA: Diagnosis not present

## 2021-04-18 DIAGNOSIS — E063 Autoimmune thyroiditis: Secondary | ICD-10-CM | POA: Diagnosis not present

## 2021-04-18 DIAGNOSIS — I700 Atherosclerosis of aorta: Secondary | ICD-10-CM | POA: Diagnosis not present

## 2021-04-18 DIAGNOSIS — E039 Hypothyroidism, unspecified: Secondary | ICD-10-CM | POA: Diagnosis not present

## 2021-04-18 DIAGNOSIS — G8929 Other chronic pain: Secondary | ICD-10-CM | POA: Diagnosis not present

## 2021-04-18 DIAGNOSIS — M810 Age-related osteoporosis without current pathological fracture: Secondary | ICD-10-CM | POA: Diagnosis not present

## 2021-04-18 DIAGNOSIS — K559 Vascular disorder of intestine, unspecified: Secondary | ICD-10-CM | POA: Diagnosis not present

## 2021-04-18 DIAGNOSIS — M7551 Bursitis of right shoulder: Secondary | ICD-10-CM | POA: Diagnosis not present

## 2021-04-18 DIAGNOSIS — K219 Gastro-esophageal reflux disease without esophagitis: Secondary | ICD-10-CM | POA: Diagnosis not present

## 2021-04-18 DIAGNOSIS — F332 Major depressive disorder, recurrent severe without psychotic features: Secondary | ICD-10-CM | POA: Diagnosis not present

## 2021-04-18 NOTE — Telephone Encounter (Signed)
do you want me to try to reach her? Didn't know if you wanted to talk to her.

## 2021-04-18 NOTE — Telephone Encounter (Signed)
Pt left message on v-mail stating she has taken Prozac, all but 4 pills. Nothings changed. Please do something she said. Contact # [phone]

## 2021-04-18 NOTE — Telephone Encounter (Signed)
Yes, give her a call please.

## 2021-04-19 ENCOUNTER — Ambulatory Visit (INDEPENDENT_AMBULATORY_CARE_PROVIDER_SITE_OTHER): Payer: Medicare Other | Admitting: Psychiatry

## 2021-04-19 DIAGNOSIS — G20 Parkinson's disease: Secondary | ICD-10-CM | POA: Diagnosis not present

## 2021-04-19 DIAGNOSIS — M5116 Intervertebral disc disorders with radiculopathy, lumbar region: Secondary | ICD-10-CM | POA: Diagnosis not present

## 2021-04-19 DIAGNOSIS — F411 Generalized anxiety disorder: Secondary | ICD-10-CM | POA: Diagnosis not present

## 2021-04-19 DIAGNOSIS — M7062 Trochanteric bursitis, left hip: Secondary | ICD-10-CM | POA: Diagnosis not present

## 2021-04-19 DIAGNOSIS — M7551 Bursitis of right shoulder: Secondary | ICD-10-CM | POA: Diagnosis not present

## 2021-04-19 DIAGNOSIS — M7061 Trochanteric bursitis, right hip: Secondary | ICD-10-CM | POA: Diagnosis not present

## 2021-04-19 NOTE — Telephone Encounter (Signed)
Rtc to pt and she reports taking Prozac 10 mg for the past 4 weeks and is not feeling any different. She does report it's suppressing her appetite and upsetting her stomach. Asking what she should do?   Informed her I would talk to Tracy Malone and call back.

## 2021-04-19 NOTE — Telephone Encounter (Signed)
Let's have her leave it off. We could try some deplin samples to help with mood.

## 2021-04-19 NOTE — Progress Notes (Signed)
[facility] Counselor/Therapist Progress Note  Patient ID: Tracy Malone, MRN: [id number],    Date: 04/19/2021  Time Spent: 35 minutes   Virtual Visit via Telephone Note: Telephone only (patient unable physically to do the video) Connected with patient by a video enabled telemedicine/telehealth application or telephone, with their informed consent, and verified patient privacy and that I am speaking with the correct person using two identifiers. I discussed the limitations, risks, security and privacy concerns of performing psychotherapy and management service by telephone and the availability of in person appointments. I also discussed with the patient that there may be a patient responsible charge related to this service. The patient expressed understanding and agreed to proceed. I discussed the treatment planning with the patient. The patient was provided an opportunity to ask questions and all were answered. The patient agreed with the plan and demonstrated an understanding of the instructions. The patient was advised to call  our office if  symptoms worsen or feel they are in a crisis state and need immediate contact.   Therapist Location: [facility] Patient Location: home   Treatment Type: Individual Therapy  Reported Symptoms: anxiety, depression, very distressed over physical issues (back, hand, and had shingles recently)  Mental Status Exam:  Appearance:   N/a    telehealth      Behavior:  Sharing  Motor:  N/a   telehealth  Speech/Language:   Clear and Coherent  Affect:  N/a   telehealth  Mood:  anxious, irritable, and sad, depressed  Thought process:  goal directed  Thought content:    Some obsessiveness  Sensory/Perceptual disturbances:    WNL  Orientation:  oriented to person, place, time/date, situation, day of week, month of year, year, and stated date of Sept. 27, 2022  Attention:  Fair  Concentration:  Fair  Memory:  [place] of knowledge:   Good   Insight:    Fair  Judgment:   Good and Fair  Impulse Control:  Good and Fair   Risk Assessment: Danger to Self:  No Self-injurious Behavior: No Danger to Others: No Duty to Warn:no Physical Aggression / Violence:No  Access to Firearms a concern: No  Gang Involvement:No   Subjective:  Patient today very distressed over her physical pain after having had a physical therapy session earlier today.  She was very tearful initially and throughout a good part of the session but was some calmer towards the end.  Used the session today to vent her concerns and express her fears and anxieties and frustrations due to her medical conditions and especially her back pain that has worsened significantly today.  Husband is present in the home with her.  They are hoping to hear back from her doctor that maybe she can get an earlier to get an injection that would help her back pain.  Patient to call later about a return appointment at our office and also needing to check on whether her insurance is still covering telehealth services in the future after September 2022.  Interventions: Solution-Oriented/Positive Psychology and Ego-Supportive  Diagnosis:   ICD-10-CM   1. Generalized anxiety disorder  F41.1        Treatment Goal Plan:  Patient not signing treatment plan on computer screen due to [place]. Treatment Goals: Treatment goals will remain on treatment plan as patient works with strategies to achieve her goals.  Progress will be noted each session and documented in the "progress" section of note. Long term goal: Develop  the ability to recognize, accept, and cope with feelings of anxiety and depression. Short term goal: Verbalize any unresolved grief issues that may be contributing to anxiety and depression. Strategy: Replace negative self-defeating self talk with verbalization of realistic and positive cognitive messages to lessen depression/anxiety and improve mood.     Plan: Patient today  showed motivation and participated actively in session but was very distraught initially and frustrated over multiple medical problems and having acute pain today.  Needed session today to process her anxious/depressive thoughts and to get support.  Encouraged patient in following her orthopedic doctor's recommendations as far as the pain she was having and the plan is to get her in for an injection at their office soon.  Also encouraged patient in practicing some positive behaviors that have helped her previously including: Healthy nutrition, staying on medications as prescribed, moving about very carefully as she is able per her doctor, allowing for good sleep pattern, positive self talk, staying in the present focusing on what she can control versus cannot, remaining in touch with family and friends that are supportive, and to follow up on recommendations from her doctors as far as managing her pain.  Goal review and progress/challenges noted with patient.  Next appointment within 3 weeks depending on patient's physical condition.   This record has been created using [employer].  Chart creation errors have been sought, but may not always have been located and corrected.  Such creation errors do not reflect on the standard of medical care provided.    Shanon Ace, LCSW

## 2021-04-20 NOTE — Telephone Encounter (Signed)
Pt notified and will come get samples of [place]. I explained it's use and she is happy to try it.

## 2021-04-25 ENCOUNTER — Telehealth: Payer: Self-pay | Admitting: Neurology

## 2021-04-25 NOTE — Telephone Encounter (Signed)
Called pt back. Has pain in upper abdomen area. Has burning sensation in upper colon. Started 3 weeks ago. Unable to eat, causes stomach pain.Taking sinemet ER 25-100mg ; 1 in the am, 1 in the afternoon and 1 in the pm (cut back from 4 per day). This did not help, still having SE.  Went off of prozac about 4 days, states it was ineffective. This was rx'd at [facility] by [facility]. Has not started any other new medications.  Received flu shot about a week ago. Feels tired/weak. Drinking lots of fluids.  Has seen GI specialist, Dr. Ardis Hughs in the past. Last seen around 09/2019. Previous endoscopy/colonoscopy did not show anything.  Aware I will speak with MD and call her back with recommendation.

## 2021-04-25 NOTE — Telephone Encounter (Signed)
Pt states that she is having abdominal pain, trouble eating and loosing weight due to the Carbidopa-Levodopa ER (SINEMET CR) 25-100 MG tablet controlled release Pt would like to discuss.

## 2021-04-25 NOTE — Telephone Encounter (Signed)
Called pt back. Relayed Dr. Garth Bigness message. She will try cutting sinemet down to 1 in the am and 1 in the pm. She will also f/u with Dr. Ardis Hughs (GI specialist) in the morning to schedule an appt to be evaluated again. Nothing further needed.

## 2021-04-26 ENCOUNTER — Telehealth: Payer: Self-pay | Admitting: Pharmacist

## 2021-04-26 NOTE — Chronic Care Management (AMB) (Signed)
Chronic Care Management /Pharmacy Assistant   Name: Tracy Malone  MRN: [id number] DOB: 02/02/1932  Tracy Malone is an 87 y.o. year old female who presents for her initial CCM visit with the clinical pharmacist.  Reason for Encounter: Chart Prep for initial appointment with Christoper Fabian, D on 04/28/21.   Conditions to be addressed/monitored: Aortic Atherosclerosis, GERD, IBS, Colitis, Hypothyroidism, Parkinsons Disease and Bradycardia.   Primary concerns for visit include:   Recent office visits:  04/14/21 [name] MD (PCP) - seen for Palmar erythema and other issues. Discontinued escitalopram 5mg . Follow up as needed.   03/24/21 [name] MD (PCP) - seen for herpes zoster without complication. No medication changes. Follow up as needed.   03/18/21 Chana Bode PA-C [facility]) - seen for rash and other issues. Patient started on acyclovir 400mg  and triamcinolone acetonide. No follow up noted.   03/02/21 [name] MD (PCP) - seen for LUQ pain and other issues. Increased calcium carbonate to 2 capsules twice daily. No follow up noted.   Recent consult visits:  04/05/21 Rolm Bookbinder MD [facility]) - seen for gallstones. No medication changes. Follow up as needed.  03/25/21 Monisha Howard RN [facility]) - seen for palliative care encounter. No medication changes. No follow up needed.    03/15/21 Deloria Lair NP (Psychiatry) - seen for generalized anxiety disorder. Patient started on escitalopram 5mg . Discontinued pristiq. No follow up needed.   02/21/21 Arlice Colt MD (Neurology) - seen for parkinsons disease and other issues. Patient started on sertraline 25mg  daily. Discontinued citalopram, fluoxetine and propanolol. Follow up in 6 months.   02/15/21 Deloria Lair NP (Psychiatry) - seen for panic attacks and other issues. No medication changes. Follow up in 4 weeks.   01/27/21 Marshall Cork (Ophthalmology) - seen for squamous  blepharitis right eye. No medication changes or follow up noted.   Hospital visits:  Medication Reconciliation was completed by comparing discharge summary, patient's EMR and Pharmacy list, and upon discussion with patient.  Patient visited [facility] on 03/07/21 for 4 hours due to generalized abdominal pain and gallbladder.   New?Medications Started at [facility] Discharge:?? -started ondansetron 4mg .  Medication Changes at Hospital Discharge: -Changed None.  Medications Discontinued at Hospital Discharge: -Stopped None.   Medications that remain the same after Hospital Discharge:??  -All other medications will remain the same.    Medications: Outpatient Encounter Medications as of 04/26/2021  Medication Sig Note   acetaminophen (TYLENOL) 650 MG CR tablet Take 650 mg by mouth every 8 (eight) hours as needed for pain. (Patient not taking: No sig reported)    acyclovir (ZOVIRAX) 200 MG/5ML suspension Take 10 mLs (400 mg total) by mouth 3 (three) times daily. (Patient not taking: Reported on 04/14/2021) 03/24/2021: Taking BID   ALPRAZolam (XANAX) 0.5 MG tablet Take four tablets daily as needed for anxiety.    b complex vitamins tablet Take 1 tablet by mouth daily.    Calcium Carbonate (CALCIUM 600 PO) Take 2 capsules by mouth in the morning and at bedtime. (Patient not taking: No sig reported)    Carbidopa-Levodopa ER (SINEMET CR) 25-100 MG tablet controlled release Take 2 po qAm, one po qPM and 2 po qHS    Cholecalciferol (VITAMIN D) 2000 UNITS tablet Take 2,000 Units by mouth daily.    dicyclomine (BENTYL) 10 MG capsule As needed (Patient not taking: No sig reported)    FLUoxetine (PROZAC) 10 MG capsule Prozac 10 mg capsule 04/14/2021:  Did not take today   hydrOXYzine (ATARAX/VISTARIL) 10 MG tablet Take 1 tablet (10 mg total) by mouth at bedtime. (Patient not taking: Reported on 04/14/2021)    ondansetron (ZOFRAN ODT) 4 MG disintegrating tablet Take 1 tablet (4 mg  total) by mouth every 8 (eight) hours as needed for nausea or vomiting. (Patient not taking: No sig reported) 03/10/2021: Has not picked it up yet   Probiotic Product (ALIGN PO) Take 1 capsule by mouth daily.    SYNTHROID 25 MCG tablet TAKE 1 TABLET DAILY BEFORE BREAKFAST FOR HYPOTHYROIDISM    triamcinolone cream (KENALOG) 0.1 % Apply 1 application topically 2 (two) times daily. (Patient not taking: Reported on 04/14/2021)    Facility-Administered Encounter Medications as of 04/26/2021  Medication   0.9 %  sodium chloride infusion   Fill History: alprazolam 0.5 mg tablet 04/12/2021 30   loteprednol etabonate (Lotemax) 0.5% drops,suspension 01/27/2021 30   hydroxyzine HCl 10 mg tablet 03/31/2021 30   Initial Questions Have you seen any other providers since your last visit?   Any changes in your medications or health?   Any side effects from any medications?   Do you have an symptoms or problems not managed by your medications?   Any concerns about your health right now?   Has your provider asked that you check blood pressure, blood sugar, or follow special diet at home?   Do you get any type of exercise on a regular basis?   Can you think of a goal you would like to reach for your health?   Do you have any problems getting your medications?   Is there anything that you would like to discuss during the appointment?   Please bring medications and supplements to appointment.  Notes: Spoke with patient who stated she needed to cancel her initial appointment with Jeni Salles. Patient stated she has some other health things going on right now she has to get done and she is not going to be able to make the appointment. Patient stated she will call back to reschedule.  Care Gaps:  AWV - scheduled for 11/30/21 Zoster vaccines - never done Tetanus/Tdap - overdue since 09/24/19   Star Rating Drugs:  None.  [place]  Clinical Pharmacist Assistant [phone]

## 2021-04-27 DIAGNOSIS — M7061 Trochanteric bursitis, right hip: Secondary | ICD-10-CM | POA: Diagnosis not present

## 2021-04-27 DIAGNOSIS — F411 Generalized anxiety disorder: Secondary | ICD-10-CM | POA: Diagnosis not present

## 2021-04-27 DIAGNOSIS — M7551 Bursitis of right shoulder: Secondary | ICD-10-CM | POA: Diagnosis not present

## 2021-04-27 DIAGNOSIS — M5116 Intervertebral disc disorders with radiculopathy, lumbar region: Secondary | ICD-10-CM | POA: Diagnosis not present

## 2021-04-27 DIAGNOSIS — G20 Parkinson's disease: Secondary | ICD-10-CM | POA: Diagnosis not present

## 2021-04-27 DIAGNOSIS — M7062 Trochanteric bursitis, left hip: Secondary | ICD-10-CM | POA: Diagnosis not present

## 2021-04-28 ENCOUNTER — Other Ambulatory Visit: Payer: Self-pay

## 2021-04-28 ENCOUNTER — Ambulatory Visit: Payer: Medicare Other

## 2021-04-28 ENCOUNTER — Ambulatory Visit (INDEPENDENT_AMBULATORY_CARE_PROVIDER_SITE_OTHER): Payer: Medicare Other | Admitting: Pharmacist

## 2021-04-28 DIAGNOSIS — E039 Hypothyroidism, unspecified: Secondary | ICD-10-CM

## 2021-04-28 DIAGNOSIS — F339 Major depressive disorder, recurrent, unspecified: Secondary | ICD-10-CM

## 2021-04-28 NOTE — Patient Instructions (Addendum)
Hi Tracy Malone!  It was so lovely to meet you and your husband. Here's a reminder of some of the topics we discussed:  Try capsacin cream for pain Try Fairlife protein shakes Look for fibercon (fiber tablets) Look for calcium citrate instead of calcium carbonate when you finish out with Caltrate  Please let me know if you have any questions about anything we discussed or anything else related to your medications! Best, Maddie  Tracy Malone, PharmD, [facility] [phone]  Visit Information   Goals Addressed             This Visit's Progress    Manage My Medicine       Timeframe:  Long-Range Goal Priority:  Medium Start Date:                             Expected End Date:                       Follow Up Date 08/29/20    - call for medicine refill 2 or 3 days before it runs out - keep a list of all the medicines I take; vitamins and herbals too    Why is this important?   These steps will help you keep on track with your medicines.   Notes:        Patient Care Plan: CCM Pharmacy Care Plan     Problem Identified: Problem: Hypothyroidism, Depression, Anxiety, and Insomnia      Long-Range Goal: Patient-Specific Goal   Start Date: 04/28/2021  Expected End Date: 04/28/2022  This Visit's Progress: On track  Priority: High  Note:   Current Barriers:  Unable to independently monitor therapeutic efficacy Suboptimal therapeutic regimen for osteoporosis Does not adhere to prescribed medication regimen  Pharmacist Clinical Goal(s):  Patient will achieve adherence to monitoring guidelines and medication adherence to achieve therapeutic efficacy through collaboration with PharmD and provider.   Interventions: 1:1 collaboration with Tracy Ohara, MD regarding development and update of comprehensive plan of care as evidenced by provider attestation and co-signature Inter-disciplinary care team collaboration (see longitudinal plan of  care) Comprehensive medication review performed; medication list updated in electronic medical record  Depression/Anxiety (Goal: minimize symptoms) -Not ideally controlled -Current treatment: Alprazolam 0.5 mg 4 tablets daily as needed  -Medications previously tried/failed: Zoloft (never started), Prozac (suppressed appetite, aches and burning in stomach), Remeron, duloxetine, mirtazapine -PHQ9: 17 -GAD7: n/a -Educated on Benefits of medication for symptom control Benefits of cognitive-behavioral therapy with or without medication -Counseled on risks of long term use of benzodiazepines and finding a stable medication.  Insomnia (Goal: improve quality and quantity of sleep) -Uncontrolled -Current treatment  Quetiapine 25 mg 1 tablet at bedtime - has not started -Medications previously tried: unknown  -Counseled on practicing good sleep hygiene by setting a sleep schedule and maintaining it, avoid excessive napping, following a nightly routine, avoiding screen time for 30-60 minutes before going to bed, and making the bedroom a cool, quiet and dark space   Parkinson's disease (Goal: minimize symptoms) -Controlled -Current treatment  Sinemet CR 25-100 mg 2 tablets in the morning, 1 in the afternoon and 2 in the evening - taking 1 tablet in the morning and 1 in the evening -Medications previously tried: none  -Recommended to continue current medication Patient is taking less due to side effects.  Hypothyroidism (Goal: TSH 2.5-4.5 (with osteporosis) -Uncontrolled -Current treatment  Synthroid  25 mcg 1 tablet daily before breakfast -Medications previously tried: none  -Counseled on importance of taking it on an empty stomach and without food. Recommended decreasing dose to prevent worsening of osteoporosis.    Osteoporosis (Goal prevent fractures) -Uncontrolled -Last DEXA Scan: 08/19/20   T-Score femoral neck: -4.1  T-Score total hip: n/a  T-Score lumbar spine: -3.2  T-Score  forearm radius: n/a  10-year probability of major osteoporotic fracture: n/a  10-year probability of hip fracture: n/a -Patient is a candidate for pharmacologic treatment due to T-Score < -2.5 in femoral neck and T-Score < -2.5 in lumbar spine -Current treatment  Calcium 600 mg twice a day Vitamin D 2000 units daily -Medications previously tried: Prolia (bone pain); Alendronate (tore up her stomach)   -Recommend [phone] units of vitamin D daily. Recommend 1200 mg of calcium daily from dietary and supplemental sources. Recommend weight-bearing and muscle strengthening exercises for building and maintaining bone density. -Recommended follow up with Dr. Buddy Duty as patient would benefit from pharmacotherapy.   Health Maintenance -Vaccine gaps: shingrix, tetanus -Current therapy:  Vitamin B complex daily Ondansetron 4 mg 1 tablet as needed Align probiotic daily Triamcinolone 0.1% cream twice daily -Educated on Cost vs benefit of each product must be carefully weighed by individual consumer -Patient is satisfied with current therapy and denies issues -Recommended to continue current medication  Patient Goals/Self-Care Activities Patient will:  - take medications as prescribed  Follow Up Plan: Telephone follow up appointment with care management team member scheduled for: 4 months      Tracy Malone was given information about Chronic Care Management services today including:  CCM service includes personalized support from designated clinical staff supervised by her physician, including individualized plan of care and coordination with other care providers 24/7 contact phone numbers for assistance for urgent and routine care needs. Standard insurance, coinsurance, copays and deductibles apply for chronic care management only during months in which we provide at least 20 minutes of these services. Most insurances cover these services at 100%, however patients may be responsible for any copay,  coinsurance and/or deductible if applicable. This service may help you avoid the need for more expensive face-to-face services. Only one practitioner may furnish and bill the service in a calendar month. The patient may stop CCM services at any time (effective at the end of the month) by phone call to the office staff.  Patient agreed to services and verbal consent obtained.   The patient verbalized understanding of instructions, educational materials, and care plan provided today and agreed to receive a mailed copy of patient instructions, educational materials, and care plan.  Telephone follow up appointment with pharmacy team member scheduled for: 4 months  Tracy Malone, [facility]

## 2021-04-28 NOTE — Progress Notes (Deleted)
Chronic Care Management Pharmacy Note  04/28/2021 Name:  Tracy Malone MRN:  [id number] DOB:  12-03-1931  Summary: ***  Recommendations/Changes made from today's visit: ***  Plan: ***   Subjective: Tracy Malone is an 87 y.o. year old female who is a primary patient of [name], MD.  The CCM team was consulted for assistance with disease management and care coordination needs.    Engaged with patient face to face for initial visit in response to provider referral for pharmacy case management and/or care coordination services.   Consent to Services:  The patient was given the following information about Chronic Care Management services today, agreed to services, and gave verbal consent: 1. CCM service includes personalized support from designated clinical staff supervised by the primary care provider, including individualized plan of care and coordination with other care providers 2. 24/7 contact phone numbers for assistance for urgent and routine care needs. 3. Service will only be billed when office clinical staff spend 20 minutes or more in a month to coordinate care. 4. Only one practitioner may furnish and bill the service in a calendar month. 5.The patient may stop CCM services at any time (effective at the end of the month) by phone call to the office staff. 6. The patient will be responsible for cost sharing (co-pay) of up to 20% of the service fee (after annual deductible is met). Patient agreed to services and consent obtained.  Patient Care Team: [name], MD as PCP - General (Family Medicine) Duffy, Creola Corn, LCSW as Social Worker (Licensed Clinical Social Worker) Conan Bowens, RN as Registered Nurse [facility]) Viona Gilmore, [facility] as Pharmacist (Pharmacist)  Recent office visits: 04/14/21 [name] MD (PCP) - seen for Palmar erythema and other issues. Discontinued escitalopram 60m. Follow up as needed.    03/24/21 ERita OharaMD (PCP) -  seen for herpes zoster without complication. No medication changes. Follow up as needed.    03/18/21 DChana [facility]odePA-C [facility] - seen for rash and other issues. Patient started on acyclovir 40170mand triamcinolone acetonide. No follow up noted.    03/02/21 EvRita OharaD (PCP) - seen for LUQ pain and other issues. Increased calcium carbonate to 2 capsules twice daily. No follow up noted.   Recent consult visits: 04/05/21 MaRolm BookbinderD [facility] - seen for gallstones. No medication changes. Follow up as needed.   03/25/21 Mon[facility] Howard [facility] - seen for palliative care encounter. No medication changes. No follow up needed.     03/15/21 ReDeloria LairP (Psychiatry) - seen for generalized anxiety disorder. Patient started on escitalopram 70m9mDiscontinued pristiq. No follow up needed.    02/21/21 RicArlice Colt (Neurology) - seen for parkinsons disease and other issues. Patient started on sertraline 270m59mily. Discontinued citalopram, fluoxetine and propanolol. Follow up in 6 months.    02/15/21 RegiDeloria Lair(Psychiatry) - seen for panic attacks and other issues. No medication changes. Follow up in 4 weeks.    01/27/21 [facility]) - seen for squamous blepharitis right eye. No medication changes or follow up noted.     Hospital visits: Medication Reconciliation was completed by comparing discharge summary, patient's EMR and Pharmacy list, and upon discussion with patient.   [facility] hours due to generalized abdominal pain and gallbladder.    [facility] -started ondansetron 4mg.770mMedication Changes at  Hospital Discharge: -Changed None.   Medications Discontinued at Hospital Discharge: -Stopped None.    Medications that remain the same after Hospital Discharge:??  -All other medications will remain the same.      Objective:  Lab Results  Component Value Date   CREATININE 0.62 03/07/2021   BUN 13 03/07/2021   GFRNONAA >60 03/07/2021   GFRAA >60 03/16/2020   NA 139 03/07/2021   K 3.8 03/07/2021   CALCIUM 9.7 03/07/2021   CO2 29 03/07/2021   GLUCOSE 99 03/07/2021    Lab Results  Component Value Date/Time   HGBA1C 5.3 10/07/2020 01:43 PM   HGBA1C 5.5 01/23/2018 10:46 AM    Last diabetic Eye exam: No results found for: HMDIABEYEEXA  Last diabetic Foot exam: No results found for: HMDIABFOOTEX   Lab Results  Component Value Date   CHOL 198 03/10/2021   HDL 66 03/10/2021   LDLCALC 112 (H) 03/10/2021   TRIG 111 03/10/2021   CHOLHDL 3.0 03/10/2021    Hepatic Function Latest Ref Rng & Units 03/10/2021 03/07/2021 09/03/2019  Total Protein 6.0 - 8.5 g/dL 6.1 7.1 6.8  Albumin 3.6 - 4.6 g/dL 4.0 3.9 4.2  AST 0 - 40 IU/L 24 135(H) 19  ALT 0 - 32 IU/L 19 175(H) 4  Alk Phosphatase 44 - 121 IU/L 84 81 83  Total Bilirubin 0.0 - 1.2 mg/dL 0.4 1.4(H) 1.0  Bilirubin, Direct 0.00 - 0.40 mg/dL 0.11 - -    Lab Results  Component Value Date/Time   TSH 1.530 03/10/2021 03:47 PM   TSH 2.310 01/22/2020 12:13 PM   FREET4 1.53 10/04/2017 02:05 PM   FREET4 1.58 08/22/2017 01:29 PM    CBC Latest Ref Rng & Units 03/10/2021 03/07/2021 03/16/2020  WBC 3.4 - 10.8 x10E3/uL 8.4 12.5(H) 8.7  Hemoglobin 11.1 - 15.9 g/dL 13.4 13.9 14.3  Hematocrit 34.0 - 46.6 % 39.5 41.8 45.5  Platelets 150 - 450 x10E3/uL 266 253 316    Lab Results  Component Value Date/Time   VD25OH 50.0 09/03/2019 11:17 AM   VD25OH 38 05/13/2012 01:37 PM    Clinical ASCVD: {YES/NO:21197} The ASCVD Risk score (Arnett DK, et al., 2019) failed to calculate for the following reasons:   The 2019 ASCVD risk score is only valid for ages 7 to 60    Depression screen PHQ 2/9 03/24/2021 09/08/2020 09/08/2020  Decreased Interest 3 3 3   Down, Depressed, Hopeless 3 3 3   PHQ - 2 Score 6 6 6   Altered sleeping 2 0 -  Tired, decreased energy 3 0 -   Change in appetite 1 1 -  Feeling bad or failure about yourself  3 0 -  Trouble concentrating 1 0 -  Moving slowly or fidgety/restless 1 0 -  Suicidal thoughts 0 0 -  PHQ-9 Score 17 7 -  Difficult doing work/chores Somewhat difficult Somewhat difficult -  Some recent data might be hidden     ***Other: (CHADS2VASc if Afib, MMRC or CAT for COPD, ACT, DEXA)  Social History   Tobacco Use  Smoking Status Never  Smokeless Tobacco Never   BP Readings from Last 3 Encounters:  04/14/21 132/70  03/25/21 109/66  03/24/21 118/60   Pulse Readings from Last 3 Encounters:  04/14/21 64  03/25/21 63  03/24/21 60   Wt Readings from Last 3 Encounters:  04/14/21 97 lb 12.8 oz (44.4 kg)  03/24/21 99 lb 3.2 oz (45 kg)  03/18/21 99 lb 3.2 oz (45 kg)   BMI Readings from  Last 3 Encounters:  04/14/21 19.75 kg/m  03/24/21 20.04 kg/m  03/18/21 20.04 kg/m    Assessment/Interventions: Review of patient past medical history, allergies, medications, health status, including review of consultants reports, laboratory and other test data, was performed as part of comprehensive evaluation and provision of chronic care management services.   SDOH:  (Social Determinants of Health) assessments and interventions performed: {yes/no:20286}  SDOH Screenings   Alcohol Screen: Not on file  Depression (PHQ2-9): Medium Risk   PHQ-2 Score: 17  Financial Resource Strain: Not on file  Food Insecurity: Not on file  Housing: Not on file  Physical Activity: Not on file  Social Connections: Not on file  Stress: Not on file  Tobacco Use: Low Risk    Smoking Tobacco Use: Never   Smokeless Tobacco Use: Never  Transportation Needs: Not on file    CCM Care Plan  Allergies  Allergen Reactions   Iodinated Diagnostic Agents Anaphylaxis   Iodine Anaphylaxis    IV and topical forms. Other reaction(s): Unknown   Levsin [Hyoscyamine Sulfate]     Vision problems/pt has glaucoma   Salmon [Fish Allergy] Hives  and Shortness Of Breath   Shellfish Allergy Anaphylaxis   Tramadol Swelling   Remeron [Mirtazapine] Other (See Comments)    Cause blurred vision and red eyes, pt has glaucoma   Aspirin Other (See Comments)    Sever stomach pain due to ulcer scaring.   Bis Subcit-Metronid-Tetracyc Swelling    Tongue swelling. Face tingling Other reaction(s): Unknown   Cephalexin Hives    Other reaction(s): hives   Ciprofloxacin Diarrhea   Codeine Nausea And Vomiting   Cyclobenzaprine Other (See Comments)    Tingly/prickly sensation. Other reaction(s): tingly/prickly sensation   Darvocet [Propoxyphene N-Acetaminophen] Nausea And Vomiting   Demerol [Meperidine] Nausea Only   Dexlansoprazole Swelling    Redness, swelling and peeling of both feet. Other reaction(s): foot pain   Diphedryl [Diphenhydramine] Other (See Comments)    Increased pulse/small amount ok   Doxycycline Hyclate Other (See Comments)    GI intolerance.   Doxycycline Hyclate     Other reaction(s): GI intolerance   Epinephrine Other (See Comments)    Breathing problems Other reaction(s): breathing problems/fainting   Erythromycin Other (See Comments)    GI intolerance. Other reaction(s): GI   Fish Oil     Other reaction(s): breathing problems/hives   Hyoscyamine     Other reaction(s): eye pain   Latex Other (See Comments)    Gloves ok.  Skin gets red from elastic in underwear and latex bandaides.   Meperidine Hcl     Other reaction(s): vomiting   Nitrofurantoin Diarrhea   Other     Other reaction(s): migraines Other reaction(s): Unknown Other reaction(s): Unknown Other reaction(s): Unknown Other reaction(s): increased pulse, faint, diarrhea   Prednisone Other (See Comments)    Headache Other reaction(s): headache   Ra Diphedryl Allergy [Diphenhydramine Hcl]     Other reaction(s): increased pulse small dose okay   Sertraline Hcl Swelling and Other (See Comments)    Migraine Swelling of tongue/lip (09/2012) Other  reaction(s): Unknown   Shellfish-Derived Products     Other reaction(s): Unknown   Sulfa Antibiotics Other (See Comments)    Increased pulse, fainting, diarrhea, thrush   Wellbutrin [Bupropion] Other (See Comments)    Headaches   Xylocaine [Lidocaine Hcl]     With epinephrine, given by dentist.  Speeded up heart rate and she passed out (occured twice, at dentist)   Xylocaine [Lidocaine]  Other reaction(s): Unknown   Biaxin [Clarithromycin] Rash    Started after completing 10 day course of 2000 mg /day, Lips swelling   Ibuprofen Other (See Comments)    Motrin ok with a GI effect. Other reaction(s): rash Motrin okay with a GI effect    Medications Reviewed Today     Reviewed by [name], MD (Physician) on 04/17/21 at [place] List Status: <None>   Medication Order Taking? Sig Documenting Provider Last Dose Status Informant  0.9 %  sodium chloride infusion [id number]   Milus Banister, MD  Active   acetaminophen (TYLENOL) 650 MG CR tablet [id number] No Take 650 mg by mouth every 8 (eight) hours as needed for pain.  Patient not taking: No sig reported   [provider] Not Taking Active            Med Note Pincus Large Apr 14, 2021 11:24 AM)    acyclovir (ZOVIRAX) 200 MG/5ML suspension [id number] No Take 10 mLs (400 mg total) by mouth 3 (three) times daily.  Patient not taking: Reported on 04/14/2021   Carlena Hurl, PA-C Not Taking Active            Med Note Pincus Large Mar 24, 2021 11:29 AM) Taking BID  ALPRAZolam Duanne Moron) 0.5 MG tablet [id number] Yes Take four tablets daily as needed for anxiety. Mozingo, Berdie Ogren, NP Taking Active   b complex vitamins tablet [id number] Yes Take 1 tablet by mouth daily. [provider] Taking Active            Med Note ([place], PATTI E   Thu Jul 29, 2020  2:34 PM)    Calcium Carbonate (CALCIUM 600 PO) [id number] No Take 2 capsules by mouth in the morning and at bedtime.  Patient not taking: No  sig reported   [provider] Not Taking Active   Carbidopa-Levodopa ER (SINEMET CR) 25-100 MG tablet controlled release [id number] Yes Take 2 po qAm, one po qPM and 2 po qHS Sater, Nanine Means, MD Taking Active   Cholecalciferol (VITAMIN D) 2000 UNITS tablet [id number] Yes Take 2,000 Units by mouth daily. [provider] Taking Active            Med Note ([place], PATTI E   Thu Jul 29, 2020  2:36 PM)    dicyclomine (BENTYL) 10 MG capsule [id number] No As needed  Patient not taking: No sig reported   [provider] Not Taking Active            Med Note Pincus Large Apr 14, 2021 11:25 AM)    FLUoxetine (PROZAC) 10 MG capsule [id number] Yes Prozac 10 mg capsule [provider] Taking Active            Med Note Pincus Large Apr 14, 2021 11:25 AM) Did not take today  hydrOXYzine (ATARAX/VISTARIL) 10 MG tablet [id number] No Take 1 tablet (10 mg total) by mouth at bedtime.  Patient not taking: Reported on 04/14/2021   Mozingo, Berdie Ogren, NP Not Taking Active   ondansetron (ZOFRAN ODT) 4 MG disintegrating tablet [id number] No Take 1 tablet (4 mg total) by mouth every 8 (eight) hours as needed for nausea or vomiting.  Patient not taking: No sig reported   Lucrezia Starch, MD Not Taking Active            Med Note (COMER, ADAM D  Thu Mar 10, 2021  2:51 PM) Has not picked it up yet  Probiotic Product (ALIGN PO) [id number] Yes Take 1 capsule by mouth daily. [provider] Taking Active            Med Note ([place], Threasa Beards Jul 29, 2020  2:35 PM)    SYNTHROID 25 MCG tablet [id number] Yes TAKE 1 TABLET DAILY BEFORE BREAKFAST FOR HYPOTHYROIDISM [name], MD Taking Active   triamcinolone cream (KENALOG) 0.1 % [id number] No Apply 1 application topically 2 (two) times daily.  Patient not taking: Reported on 04/14/2021   Carlena Hurl, PA-C Not Taking Active             Patient Active Problem List   Diagnosis Date Noted    Rash 03/18/2021   Herpes zoster without complication 79/39/0300   Multiple allergies 03/18/2021   Frail elderly 03/18/2021   LUQ pain 10/29/2019   Poor appetite 10/29/2019   Loss of weight 10/29/2019   Nausea and vomiting 10/29/2019   Altered bowel habits 10/29/2019   Lumbar compression fracture (HCC) 04/03/2019   Compression fracture of L1 lumbar vertebra (HCC) 03/16/2019   Osteoporosis of lumbar spine 03/16/2019   Essential tremor 02/06/2019   Ischemic colitis ([place])    Leukocytosis    Acute colitis 11/22/2018   Colitis 11/22/2018   Acute lower UTI 11/22/2018   Acute cystitis without hematuria    Parkinson's disease ([place]) 10/24/2018   Gait disturbance 11/28/2017   Chronic lymphocytic thyroiditis 04/24/2017   Status post removal of thyroid nodule 04/24/2017   Depression with anxiety 03/16/2017   Subcutaneous nodules 03/16/2017   Aortic atherosclerosis ([place]) 11/24/2016   Allergy to multiple antibiotics 10/07/2016   Seafood allergy, anaphylaxis, subsequent encounter 92/33/0076   Helicobacter pylori gastritis 10/07/2016   Fall 09/05/2016   Bloating 08/15/2016   Severe recurrent major depression without psychotic features ([place]) 04/13/2016    Class: Chronic   Rash and nonspecific skin eruption 03/01/2016   Leg pain, bilateral 03/01/2016   Functional dyspepsia 10/13/2015   Subacromial bursitis 03/02/2015   Resting tremor 02/01/2015   Epigastric fullness 10/12/2014   Early satiety 10/12/2014   Cystocele 12/30/2013   IBS (irritable bowel syndrome) 09/30/2013   GERD (gastroesophageal reflux disease) 06/27/2013   PVC's (premature ventricular contractions) 10/02/2012   Depressive disorder, not elsewhere classified 10/02/2012   Bradycardia 08/01/2012   Fatigue 08/01/2012   Anxiety state 05/13/2012   Osteopenia 05/13/2012   Hypothyroidism 05/13/2012    Immunization History  Administered Date(s) Administered   Fluad Quad(high Dose 65+) 05/15/2019, 04/14/2021   Influenza Split  05/16/2011, 06/22/2015, 05/12/2016   Influenza, High Dose Seasonal PF 06/13/2013, 04/28/2014, 05/12/2017, 04/17/2018, 05/13/2020   Influenza-Unspecified 05/17/2016   PFIZER(Purple Top)SARS-COV-2 Vaccination 08/28/2019, 09/18/2019, 04/05/2020, 07/12/2020   Pneumococcal Conjugate-13 09/29/2013   Pneumococcal Polysaccharide-23 08/07/1999, 08/29/2018   Td 08/06/1997, 09/23/2009   Tdap 09/23/2009    Conditions to be addressed/monitored:  {USCCMDZASSESSMENTOPTIONS:23563}  There are no care plans that you recently modified to display for this patient.    Medication Assistance: {MEDASSISTANCEINFO:25044}  Compliance/Adherence/Medication fill history: Care Gaps: ***  Star-Rating Drugs: ***  Patient's preferred pharmacy is:  [facility] [id number] - [address], [place] [address] Phone: [phone] Fax: [phone]  [facility] - [place], [place] - [address] [place] [address] Phone: [phone] Fax: [place], [place] [address] [place] [address] Phone: [phone] Fax: [phone]  Uses pill box? {Yes or If no, why not?:20788} Pt endorses ***% compliance  We discussed: {Pharmacy options:24294} Patient decided to: {US Pharmacy JFHL:45625}  Care Plan and Follow Up Patient Decision:  {FOLLOWUP:24991}  Plan: {CM FOLLOW UP WLSL:37342}  Jeni Salles, PharmD, [place] Pharmacist [facility]

## 2021-04-28 NOTE — Progress Notes (Signed)
Chronic Care Management Pharmacy Note  05/05/2021 Name:  Tracy Malone MRN:  [id number] DOB:  Jan 06, 1932  Summary: Pt is in constant pain and is looking for relief TSH is not ideal for concurrent osteoporosis  Recommendations/Changes made from today's visit: -Recommended trial of capsaicin cream for pain -Recommended fairlife protein shakes -Recommended fibercon to improve constipation -Recommended switching to calcium citrate to improve absorption of calcium -Recommend decreasing Synthroid to target less strict goal TSH to prevent worsening of osteoporosis   Plan: Follow up general assessment in 1-2 months   Subjective: Tracy Malone is an 87 y.o. year old female who is a primary patient of [name], MD.  The CCM team was consulted for assistance with disease management and care coordination needs.    Engaged with patient face to face for initial visit in response to provider referral for pharmacy case management and/or care coordination services.   Consent to Services:  The patient was given the following information about Chronic Care Management services today, agreed to services, and gave verbal consent: 1. CCM service includes personalized support from designated clinical staff supervised by the primary care provider, including individualized plan of care and coordination with other care providers 2. 24/7 contact phone numbers for assistance for urgent and routine care needs. 3. Service will only be billed when office clinical staff spend 20 minutes or more in a month to coordinate care. 4. Only one practitioner may furnish and bill the service in a calendar month. 5.The patient may stop CCM services at any time (effective at the end of the month) by phone call to the office staff. 6. The patient will be responsible for cost sharing (co-pay) of up to 20% of the service fee (after annual deductible is met). Patient agreed to services and consent obtained.  Patient Care  Team: [name], MD as PCP - General (Family Medicine) Duffy, Creola Corn, LCSW as Social Worker (Licensed Clinical Social Worker) Conan Bowens, RN as Registered Nurse [facility] [place]) Viona Gilmore, [facility] as Pharmacist (Pharmacist)  Recent office visits: 04/14/21 [name] MD (PCP) - seen for Palmar erythema and other issues. Discontinued escitalopram 64m. Follow up as needed.    03/24/21 ERita OharaMD (PCP) - seen for herpes zoster without complication. No medication changes. Follow up as needed.    03/18/21 DChana BodePA-C [facility] - seen for rash and other issues. Patient started on acyclovir 406mand triamcinolone acetonide. No follow up noted.    03/02/21 EvRita OharaD (PCP) - seen for LUQ pain and other issues. Increased calcium carbonate to 2 capsules twice daily. No follow up noted.   Recent consult visits: 04/05/21 MaRolm BookbinderD [facility] - seen for gallstones. No medication changes. Follow up as needed.   03/25/21 Mon[facility] Howard [facility] - seen for palliative care encounter. No medication changes. No follow up needed.     03/15/21 ReDeloria LairP (Psychiatry) - seen for generalized anxiety disorder. Patient started on escitalopram 34m42mDiscontinued pristiq. No follow up needed.    02/21/21 RicArlice Colt (Neurology) - seen for parkinsons disease and other issues. Patient started on sertraline 234m534mily. Discontinued citalopram, fluoxetine and propanolol. Follow up in 6 months.    02/15/21 RegiDeloria Lair(Psychiatry) - seen for panic attacks and other issues. No medication changes. Follow up in 4 weeks.    01/27/21 [facility]) - seen for squamous blepharitis right eye. No medication changes or follow  up noted.     Hospital visits: Medication Reconciliation was completed by comparing discharge summary, patient's EMR and Pharmacy list, and upon discussion with patient.   Patient  visited [facility] on 03/07/21 for 4 hours due to generalized abdominal pain and gallbladder.    New?Medications Started at [facility] Discharge:?? -started ondansetron 49m.   Medication Changes at Hospital Discharge: -Changed None.   Medications Discontinued at Hospital Discharge: -Stopped None.    Medications that remain the same after Hospital Discharge:??  -All other medications will remain the same.     Objective:  Lab Results  Component Value Date   CREATININE 0.62 03/07/2021   BUN 13 03/07/2021   GFRNONAA >60 03/07/2021   GFRAA >60 03/16/2020   NA 139 03/07/2021   K 3.8 03/07/2021   CALCIUM 9.7 03/07/2021   CO2 29 03/07/2021   GLUCOSE 99 03/07/2021    Lab Results  Component Value Date/Time   HGBA1C 5.3 10/07/2020 01:43 PM   HGBA1C 5.5 01/23/2018 10:46 AM    Last diabetic Eye exam: No results found for: HMDIABEYEEXA  Last diabetic Foot exam: No results found for: HMDIABFOOTEX   Lab Results  Component Value Date   CHOL 198 03/10/2021   HDL 66 03/10/2021   LDLCALC 112 (H) 03/10/2021   TRIG 111 03/10/2021   CHOLHDL 3.0 03/10/2021    Hepatic Function Latest Ref Rng & Units 03/10/2021 03/07/2021 09/03/2019  Total Protein 6.0 - 8.5 g/dL 6.1 7.1 6.8  Albumin 3.6 - 4.6 g/dL 4.0 3.9 4.2  AST 0 - 40 IU/L 24 135(H) 19  ALT 0 - 32 IU/L 19 175(H) 4  Alk Phosphatase 44 - 121 IU/L 84 81 83  Total Bilirubin 0.0 - 1.2 mg/dL 0.4 1.4(H) 1.0  Bilirubin, Direct 0.00 - 0.40 mg/dL 0.11 - -    Lab Results  Component Value Date/Time   TSH 1.530 03/10/2021 03:47 PM   TSH 2.310 01/22/2020 12:13 PM   FREET4 1.53 10/04/2017 02:05 PM   FREET4 1.58 08/22/2017 01:29 PM    CBC Latest Ref Rng & Units 03/10/2021 03/07/2021 03/16/2020  WBC 3.4 - 10.8 x10E3/uL 8.4 12.5(H) 8.7  Hemoglobin 11.1 - 15.9 g/dL 13.4 13.9 14.3  Hematocrit 34.0 - 46.6 % 39.5 41.8 45.5  Platelets 150 - 450 x10E3/uL 266 253 316    Lab Results  Component Value Date/Time   VD25OH  50.0 09/03/2019 11:17 AM   VD25OH 38 05/13/2012 01:37 PM    Clinical ASCVD: No  The ASCVD Risk score (Arnett DK, et al., 2019) failed to calculate for the following reasons:   The 2019 ASCVD risk score is only valid for ages 465to 765   Depression screen PHQ 2/9 03/24/2021 09/08/2020 09/08/2020  Decreased Interest _0 Down, Depressed, Hopeless _1 PHQ - 2 Score _2 Altered sleeping 2 0 -  Tired, decreased energy 3 0 -  Change in appetite 1 1 -  Feeling bad or failure about yourself  3 0 -  Trouble concentrating 1 0 -  Moving slowly or fidgety/restless 1 0 -  Suicidal thoughts 0 0 -  PHQ-9 Score 17 7 -  Difficult doing work/chores Somewhat difficult Somewhat difficult -  Some recent data might be hidden      Social History   Tobacco Use  Smoking Status Never  Smokeless Tobacco Never   BP Readings from Last 3 Encounters:  05/04/21 112/60  04/14/21 132/70  03/25/21 109/66  Pulse Readings from Last 3 Encounters:  05/04/21 80  04/14/21 64  03/25/21 63   Wt Readings from Last 3 Encounters:  05/04/21 98 lb (44.5 kg)  04/14/21 97 lb 12.8 oz (44.4 kg)  03/24/21 99 lb 3.2 oz (45 kg)   BMI Readings from Last 3 Encounters:  05/04/21 19.79 kg/m  04/14/21 19.75 kg/m  03/24/21 20.04 kg/m    Assessment/Interventions: Review of patient past medical history, allergies, medications, health status, including review of consultants reports, laboratory and other test data, was performed as part of comprehensive evaluation and provision of chronic care management services.   SDOH:  (Social Determinants of Health) assessments and interventions performed: Yes SDOH Interventions    Flowsheet Row Most Recent Value  SDOH Interventions   Financial Strain Interventions Intervention Not Indicated  Transportation Interventions Intervention Not Indicated      SDOH Screenings   Alcohol Screen: Not on file  Depression (PHQ2-9): Medium Risk   PHQ-2 Score: 17  Financial  Resource Strain: Low Risk    Difficulty of Paying Living Expenses: Not hard at all  Food Insecurity: Not on file  Housing: Not on file  Physical Activity: Not on file  Social Connections: Not on file  Stress: Not on file  Tobacco Use: Low Risk    Smoking Tobacco Use: Never   Smokeless Tobacco Use: Never  Transportation Needs: No Transportation Needs   Lack of Transportation (Medical): No   Lack of Transportation (Non-Medical): No   Patient and husband presented to visit and patient's biggest concern right now is her constant pain. She doesn't want any strong pain medicine as she really tries to limit what she has to take for medications. She is currently using Tylenol only and the injections did not help. She mostly has back pain.  She also reports she has a lot of issues with sleep. She usually wakes up during the night often and goes to bathroom 1-2 times in the night. She does wake up in pain often.    Patient was just prescribed Seroquel for sleep and has a lot of questions about it. She has not yet started taking it yet because she was concerned about side effects. She follows up regularly with a therapist and she has a session scheduled for tomorrow.  Patient is also struggling to keep weight on and does not have much of an appetite. She is unable to drink a lot of protein shakes due to having lactose and she is lactose intolerant.    CCM Care Plan  Allergies  Allergen Reactions   Iodinated Diagnostic Agents Anaphylaxis   Iodine Anaphylaxis    IV and topical forms. Other reaction(s): Unknown   Levsin [Hyoscyamine Sulfate]     Vision problems/pt has glaucoma   Salmon [Fish Allergy] Hives and Shortness Of Breath   Shellfish Allergy Anaphylaxis   Tramadol Swelling   Remeron [Mirtazapine] Other (See Comments)    Cause blurred vision and red eyes, pt has glaucoma   Aspirin Other (See Comments)    Sever stomach pain due to ulcer scaring.   Bis Subcit-Metronid-Tetracyc Swelling     Tongue swelling. Face tingling Other reaction(s): Unknown   Cephalexin Hives    Other reaction(s): hives   Ciprofloxacin Diarrhea   Codeine Nausea And Vomiting   Cyclobenzaprine Other (See Comments)    Tingly/prickly sensation. Other reaction(s): tingly/prickly sensation   Darvocet [Propoxyphene N-Acetaminophen] Nausea And Vomiting   Demerol [Meperidine] Nausea Only   Dexlansoprazole Swelling    Redness, swelling and  peeling of both feet. Other reaction(s): foot pain   Diphedryl [Diphenhydramine] Other (See Comments)    Increased pulse/small amount ok   Doxycycline Hyclate Other (See Comments)    GI intolerance.   Doxycycline Hyclate     Other reaction(s): GI intolerance   Epinephrine Other (See Comments)    Breathing problems Other reaction(s): breathing problems/fainting   Erythromycin Other (See Comments)    GI intolerance. Other reaction(s): GI   Fish Oil     Other reaction(s): breathing problems/hives   Hyoscyamine     Other reaction(s): eye pain   Latex Other (See Comments)    Gloves ok.  Skin gets red from elastic in underwear and latex bandaides.   Meperidine Hcl     Other reaction(s): vomiting   Nitrofurantoin Diarrhea   Other     Other reaction(s): migraines Other reaction(s): Unknown Other reaction(s): Unknown Other reaction(s): Unknown Other reaction(s): increased pulse, faint, diarrhea   Prednisone Other (See Comments)    Headache Other reaction(s): headache   Ra Diphedryl Allergy [Diphenhydramine Hcl]     Other reaction(s): increased pulse small dose okay   Sertraline Hcl Swelling and Other (See Comments)    Migraine Swelling of tongue/lip (09/2012) Other reaction(s): Unknown   Shellfish-Derived Products     Other reaction(s): Unknown   Sulfa Antibiotics Other (See Comments)    Increased pulse, fainting, diarrhea, thrush   Wellbutrin [Bupropion] Other (See Comments)    Headaches   Xylocaine [Lidocaine Hcl]     With epinephrine, given by  dentist.  Speeded up heart rate and she passed out (occured twice, at dentist)   Xylocaine [Lidocaine]     Other reaction(s): Unknown   Biaxin [Clarithromycin] Rash    Started after completing 10 day course of 2000 mg /day, Lips swelling   Ibuprofen Other (See Comments)    Motrin ok with a GI effect. Other reaction(s): rash Motrin okay with a GI effect    Medications Reviewed Today     Reviewed by Cain Sieve, CMA (Certified Medical Assistant) on 05/04/21 at 1004  Med List Status: <None>   Medication Order Taking? Sig Documenting Provider Last Dose Status Informant  0.9 %  sodium chloride infusion [id number]   Milus Banister, MD  Active   acetaminophen (TYLENOL) 650 MG CR tablet [id number] Yes Take 650 mg by mouth every 8 (eight) hours as needed for pain. [provider] Taking Active            Med Note Pincus Large Apr 14, 2021 11:24 AM)    ALPRAZolam Duanne Moron) 0.5 MG tablet [id number] Yes Take four tablets daily as needed for anxiety. Mozingo, Berdie Ogren, NP Taking Active   b complex vitamins tablet [id number] Yes Take 1 tablet by mouth daily. [provider] Taking Active            Med Note ([place], PATTI E   Thu Jul 29, 2020  2:34 PM)    Calcium Carbonate (CALCIUM 600 PO) [id number] Yes Take 1 capsule by mouth in the morning and at bedtime. [provider] Taking Active   Carbidopa-Levodopa ER (SINEMET CR) 25-100 MG tablet controlled release [id number] Yes Take 2 po qAm, one po qPM and 2 po qHS Sater, Nanine Means, MD Taking Active   Cholecalciferol (VITAMIN D) 2000 UNITS tablet [id number] Yes Take 2,000 Units by mouth daily. [provider] Taking Active            Med Note ([place], PATTI E  Thu Jul 29, 2020  2:36 PM)    dicyclomine (BENTYL) 10 MG capsule [id number] Yes As needed [provider] Taking Active            Med Note Pincus Large Apr 14, 2021 11:25 AM)    Probiotic Product (ALIGN PO) [id number] Yes  Take 1 capsule by mouth daily. [provider] Taking Active            Med Note ([place], PATTI E   Thu Jul 29, 2020  2:35 PM)    QUEtiapine (SEROQUEL) 25 MG tablet [id number] Yes Take 25 mg by mouth at bedtime. [provider] Taking Active   SYNTHROID 25 MCG tablet [id number] Yes TAKE 1 TABLET DAILY BEFORE BREAKFAST FOR HYPOTHYROIDISM [name], MD Taking Active             Patient Active Problem List   Diagnosis Date Noted   Rash 03/18/2021   Herpes zoster without complication 83/41/9622   Multiple allergies 03/18/2021   Frail elderly 03/18/2021   LUQ pain 10/29/2019   Poor appetite 10/29/2019   Loss of weight 10/29/2019   Nausea and vomiting 10/29/2019   Altered bowel habits 10/29/2019   Lumbar compression fracture ([place]) 04/03/2019   Compression fracture of L1 lumbar vertebra (HCC) 03/16/2019   Osteoporosis of lumbar spine 03/16/2019   Essential tremor 02/06/2019   Ischemic colitis ([place])    Leukocytosis    Acute colitis 11/22/2018   Colitis 11/22/2018   Acute lower UTI 11/22/2018   Acute cystitis without hematuria    Parkinson's disease ([place]) 10/24/2018   Gait disturbance 11/28/2017   Chronic lymphocytic thyroiditis 04/24/2017   Status post removal of thyroid nodule 04/24/2017   Depression with anxiety 03/16/2017   Subcutaneous nodules 03/16/2017   Aortic atherosclerosis ([place]) 11/24/2016   Allergy to multiple antibiotics 10/07/2016   Seafood allergy, anaphylaxis, subsequent encounter 29/79/8921   Helicobacter pylori gastritis 10/07/2016   Fall 09/05/2016   Bloating 08/15/2016   Severe recurrent major depression without psychotic features ([place]) 04/13/2016    Class: Chronic   Rash and nonspecific skin eruption 03/01/2016   Leg pain, bilateral 03/01/2016   Functional dyspepsia 10/13/2015   Subacromial bursitis 03/02/2015   Resting tremor 02/01/2015   Epigastric fullness 10/12/2014   Early satiety 10/12/2014   Cystocele 12/30/2013   IBS  (irritable bowel syndrome) 09/30/2013   GERD (gastroesophageal reflux disease) 06/27/2013   PVC's (premature ventricular contractions) 10/02/2012   Depressive disorder, not elsewhere classified 10/02/2012   Bradycardia 08/01/2012   Fatigue 08/01/2012   Anxiety state 05/13/2012   Osteopenia 05/13/2012   Hypothyroidism 05/13/2012    Immunization History  Administered Date(s) Administered   Fluad Quad(high Dose 65+) 05/15/2019, 04/14/2021   Influenza Split 05/16/2011, 06/22/2015, 05/12/2016   Influenza, High Dose Seasonal PF 06/13/2013, 04/28/2014, 05/12/2017, 04/17/2018, 05/13/2020   Influenza-Unspecified 05/17/2016   PFIZER(Purple Top)SARS-COV-2 Vaccination 08/28/2019, 09/18/2019, 04/05/2020, 07/12/2020   Pneumococcal Conjugate-13 09/29/2013   Pneumococcal Polysaccharide-23 08/07/1999, 08/29/2018   Td 08/06/1997, 09/23/2009   Tdap 09/23/2009    Conditions to be addressed/monitored:  Hypothyroidism, Depression, Anxiety, Osteoporosis, and Insomnia  Care Plan : [place]  Updates made by Viona Gilmore, [place] since 05/05/2021 12:00 AM     Problem: Problem: Hypothyroidism, Depression, Anxiety, and Insomnia      Long-Range Goal: Patient-Specific Goal   Start Date: 04/28/2021  Expected End Date: 04/28/2022  This Visit's Progress: On track  Priority: High  Note:   Current Barriers:  Unable  to independently monitor therapeutic efficacy Suboptimal therapeutic regimen for osteoporosis Does not adhere to prescribed medication regimen  Pharmacist Clinical Goal(s):  Patient will achieve adherence to monitoring guidelines and medication adherence to achieve therapeutic efficacy through collaboration with PharmD and provider.   Interventions: 1:1 collaboration with [name], MD regarding development and update of comprehensive plan of care as evidenced by provider attestation and co-signature Inter-disciplinary care team collaboration (see longitudinal plan of  care) Comprehensive medication review performed; medication list updated in electronic medical record  Depression/Anxiety (Goal: minimize symptoms) -Not ideally controlled -Current treatment: Alprazolam 0.5 mg 4 tablets daily as needed  -Medications previously tried/failed: Zoloft (never started), Prozac (suppressed appetite, aches and burning in stomach), Remeron, duloxetine, mirtazapine -PHQ9: 17 -GAD7: n/a -Educated on Benefits of medication for symptom control Benefits of cognitive-behavioral therapy with or without medication -Counseled on risks of long term use of benzodiazepines and finding a stable medication.  Insomnia (Goal: improve quality and quantity of sleep) -Uncontrolled -Current treatment  Quetiapine 25 mg 1 tablet at bedtime - has not started -Medications previously tried: unknown  -Counseled on practicing good sleep hygiene by setting a sleep schedule and maintaining it, avoid excessive napping, following a nightly routine, avoiding screen time for 30-60 minutes before going to bed, and making the bedroom a cool, quiet and dark space   Parkinson's disease (Goal: minimize symptoms) -Controlled -Current treatment  Sinemet CR 25-100 mg 2 tablets in the morning, 1 in the afternoon and 2 in the evening - taking 1 tablet in the morning and 1 in the evening -Medications previously tried: none  -Recommended to continue current medication Patient is taking less due to side effects.  Hypothyroidism (Goal: TSH 2.5-4.5 (with osteporosis) -Uncontrolled -Current treatment  Synthroid 25 mcg 1 tablet daily before breakfast -Medications previously tried: none  -Counseled on importance of taking it on an empty stomach and without food. Recommended decreasing dose to prevent worsening of osteoporosis.    Osteoporosis (Goal prevent fractures) -Uncontrolled -Last DEXA Scan: 08/19/20   T-Score femoral neck: -4.1  T-Score total hip: n/a  T-Score lumbar spine: -3.2  T-Score  forearm radius: n/a  10-year probability of major osteoporotic fracture: n/a  10-year probability of hip fracture: n/a -Patient is a candidate for pharmacologic treatment due to T-Score < -2.5 in femoral neck and T-Score < -2.5 in lumbar spine -Current treatment  Calcium 600 mg twice a day Vitamin D 2000 units daily -Medications previously tried: Prolia (bone pain); Alendronate (tore up her stomach)   -Recommend [phone] units of vitamin D daily. Recommend 1200 mg of calcium daily from dietary and supplemental sources. Recommend weight-bearing and muscle strengthening exercises for building and maintaining bone density. -Recommended follow up with Dr. Buddy Duty as patient would benefit from pharmacotherapy.   Health Maintenance -Vaccine gaps: shingrix, tetanus -Current therapy:  Vitamin B complex daily Ondansetron 4 mg 1 tablet as needed Align probiotic daily Triamcinolone 0.1% cream twice daily -Educated on Cost vs benefit of each product must be carefully weighed by individual consumer -Patient is satisfied with current therapy and denies issues -Recommended to continue current medication  Patient Goals/Self-Care Activities Patient will:  - take medications as prescribed  Follow Up Plan: Telephone follow up appointment with care management team member scheduled for: 4 months        Medication Assistance: None required.  Patient affirms current coverage meets needs.  Compliance/Adherence/Medication fill history: Care Gaps: Shingrix, tetanus  Star-Rating Drugs: None  Patient's preferred pharmacy is:  [facility] [id number] -  [place], [place] [address] [place] [address] Phone: [phone] Fax: [phone]  [facility] - [place], [place] - [address] [place] [address] Phone: [phone] Fax: [phone]  [facility] - [place], [place] - [address] [place] [address] Phone: [phone] Fax: [phone]  Uses pill box? No - has her own system  (does not like the pillbox) Pt endorses 99% compliance  We discussed: Benefits of medication synchronization, packaging and delivery as well as enhanced pharmacist oversight with [employer]. Patient decided to: Continue current medication management strategy  Care Plan and Follow Up Patient Decision:  Patient agrees to Care Plan and Follow-up.  Plan: Telephone follow up appointment with care management team member scheduled for:  3 months  Jeni Salles, PharmD, [place] Pharmacist [place] [facility] [place] [phone]

## 2021-04-29 ENCOUNTER — Telehealth: Payer: Self-pay | Admitting: Adult Health

## 2021-04-29 ENCOUNTER — Telehealth: Payer: Self-pay | Admitting: Nurse Practitioner

## 2021-04-29 NOTE — Telephone Encounter (Signed)
Please review

## 2021-04-29 NOTE — Telephone Encounter (Signed)
Pt is scheduled to see Tracy Malone on 10/26 but is looking for a sooner appt or some advise on what to do. She states that she is not able to eat because anything she eats gives her stomach pain on her left side, and her stomach get very distented. Pls call her.

## 2021-04-29 NOTE — Telephone Encounter (Signed)
Pt picked up samples today but she has questions she wants to ask Traci before she starts to take the med.

## 2021-04-29 NOTE — Telephone Encounter (Signed)
Pt LVM again at 4:04 with same issue.

## 2021-04-29 NOTE — Telephone Encounter (Signed)
Spoke with the patient. She is complaining of feeling bloated, full and no appetite. She reports bowel movements maybe every other day and "not very much." Reviewed her records. She has a history of this issue. Denies pain, bloody with stools or vomiting. She is currently taking fiber supplement only for her bowels. States she is good about drinking water.  Suggested she go back to daily Miralax and continue her water. She will keep her scheduled appointment. I will monitor the schedule for something sooner.

## 2021-05-02 DIAGNOSIS — M5136 Other intervertebral disc degeneration, lumbar region: Secondary | ICD-10-CM | POA: Diagnosis not present

## 2021-05-02 DIAGNOSIS — M5416 Radiculopathy, lumbar region: Secondary | ICD-10-CM | POA: Diagnosis not present

## 2021-05-02 NOTE — Telephone Encounter (Signed)
Rtc to pt about her [place] and answered her questions.

## 2021-05-03 ENCOUNTER — Ambulatory Visit (INDEPENDENT_AMBULATORY_CARE_PROVIDER_SITE_OTHER): Payer: Medicare Other | Admitting: Psychiatry

## 2021-05-03 ENCOUNTER — Other Ambulatory Visit: Payer: Self-pay | Admitting: Adult Health

## 2021-05-03 DIAGNOSIS — M5416 Radiculopathy, lumbar region: Secondary | ICD-10-CM | POA: Diagnosis not present

## 2021-05-03 DIAGNOSIS — F411 Generalized anxiety disorder: Secondary | ICD-10-CM

## 2021-05-03 NOTE — Telephone Encounter (Signed)
This Rx was sent to [facility]  on 9/20  Will check last refill

## 2021-05-03 NOTE — Progress Notes (Signed)
[facility] Counselor/Therapist Progress Note  Patient ID: Tracy Malone, MRN: [id number],    Date: 05/03/2021  Time Spent: 50 minutes   Virtual Visit via Telephone Note:  Telephone Only as patient not able to do the Video  Connected with patient by a telemedicine/telehealth application, with their informed consent, and verified patient privacy and that I am speaking with the correct person using two identifiers. I discussed the limitations, risks, security and privacy concerns of performing psychotherapy and the availability of in person appointments. I also discussed with the patient that there may be a patient responsible charge related to this service. The patient expressed understanding and agreed to proceed. I discussed the treatment planning with the patient. The patient was provided an opportunity to ask questions and all were answered. The patient agreed with the plan and demonstrated an understanding of the instructions. The patient was advised to call  our office if  symptoms worsen or feel they are in a crisis state and need immediate contact.   Therapist Location: [facility] Patient Location: home    Treatment Type: Individual Therapy  Reported Symptoms: anxiety, assuming "the worst", depressing  Mental Status Exam:  Appearance:   N/a  telehealth      Behavior:  Appropriate, Sharing, and some motivation  Motor:  Having difficulties with her back and walking  Speech/Language:   Clear and Coherent  Affect:  N/a  telehealth  Mood:  anxious and depressed  Thought process:  Some tangentiality  Thought content:    Some rumination  Sensory/Perceptual disturbances:    WNL  Orientation:  oriented to person, place, time/date, situation, day of week, month of year, year, and stated date of Oct. 11, 2022  Attention:  Fair  Concentration:  Fair  Memory:  [place] of knowledge:   Good  Insight:    Fair  Judgment:   Good and Fair  Impulse Control:  Good    Risk Assessment: Danger to Self:  No Self-injurious Behavior: No Danger to Others: No Duty to Warn:no Physical Aggression / Violence:No  Access to Firearms a concern: No  Gang Involvement:No   Subjective:  Patient today expressing anxiety, depression, and tending to assume worst case scenarios.  States I didn't used to be a negative person. Discussed today how hanging onto the negatives from the past, keeps her from moving forward.  Tearfully talked about how she feels medically she has "gone downhill more" and that is hard to accept. Talked through a lot of her fears/anxieties/negativity and helped her try to change those thoughts to be more reality-based and empowering for patient.  Also worked collaboratively on helping her let go of anxious/negative assumptions and try looking more for "what might go right versus wrong".  This is a longtime habit for patient and is understandably difficult for her to change but she did show more effort today and doing this, which is encouraging for her.  Husband, even though he has some health issues, is very much a strong supporter for her as well.  Son in [place] is moving to a halfway house reportedly on November 9 and at that point will have more freedom to have increased contact with patient, which will also be supportive of her.  Interventions: Solution-Oriented/Positive Psychology and Ego-Supportive  Diagnosis:   ICD-10-CM   1. Generalized anxiety disorder  F41.1      Treatment Goal Plan:  Patient not signing treatment plan on computer screen due to [place]. Treatment Goals: Treatment  goals will remain on treatment plan as patient works with strategies to achieve her goals.  Progress will be noted each session and documented in the "progress" section of note. Long term goal: Develop the ability to recognize, accept, and cope with feelings of anxiety and depression. Short term goal: Verbalize any unresolved grief issues that may be contributing to  anxiety and depression. Strategy: Replace negative self-defeating self talk with verbalization of realistic and positive cognitive messages to lessen depression/anxiety and improve mood.     Plan:  Patient today showing increased motivation and participated well in session today.  Initially she was very distraught and lacking in motivation but as the session went on she was able to join with me in working more on her goals and showing improved motivation.  Processed a lot of her anxious/fearful/negative thoughts in order to challenge them and replace with more accurate, reality based, and empowering thoughts to help encourage her.  Discussed at length her tendency for what she says has been most of her life to look for the "negatives versus the positives and also what might go wrong versus right".  States that she really does want to change this habit and we will be focusing on this more in future sessions.  It was interesting that she did hang on to this attitude for the rest of the session which was probably at least a third of the session towards the end which was really good effort shown by patient.  Encouraged her also in practicing some positive behaviors that have helped her previously including: Healthy nutrition, staying on her medications as prescribed, moving about carefully to get some exercise as she is able per her doctor, allowing for good sleep patterns, positive self talk more consistently, staying in the present focusing on what she can control versus cannot, remaining in touch with family and friends that are supportive, remaining in contact with her church and one of the ministers that has visited her, continuing the difficult work of letting go of negativity and past hurts and regrets in order to move forward, and feel good about the strength she shows in working with goal-directed behaviors as she tries to move in a direction that supports overall stability and improved emotional  health.  Goal review and progress/challenges noted with patient.  Next appointment within 2 to 3 weeks.  This record has been created using [employer].  Chart creation errors have been sought, but may not always have been located and corrected.  Such creation errors do not reflect on the standard of medical care provided.    Shanon Ace, LCSW

## 2021-05-04 ENCOUNTER — Ambulatory Visit (INDEPENDENT_AMBULATORY_CARE_PROVIDER_SITE_OTHER): Payer: Medicare Other | Admitting: Nurse Practitioner

## 2021-05-04 ENCOUNTER — Encounter: Payer: Self-pay | Admitting: Nurse Practitioner

## 2021-05-04 VITALS — BP 112/60 | HR 80 | Ht 59.0 in | Wt 98.0 lb

## 2021-05-04 DIAGNOSIS — R1012 Left upper quadrant pain: Secondary | ICD-10-CM | POA: Diagnosis not present

## 2021-05-04 DIAGNOSIS — R140 Abdominal distension (gaseous): Secondary | ICD-10-CM

## 2021-05-04 NOTE — Patient Instructions (Signed)
RECOMMENDATIONS: Gas-X 1 tablet 30 minutes before breakfast and dinner. Change Probiotic. IBgard 1 capsule twice a day as needed for abdominal bloating. Follow up with your primary care provider regarding left ovarian cyst. Follow FODMAP diet.  It was great seeing you today! Thank you for entrusting me with your care and choosing [facility].  Noralyn Pick, CRNP  Low-FODMAP Eating Plan FODMAP stands for fermentable oligosaccharides, disaccharides, monosaccharides, and polyols. These are sugars that are hard for some people to digest. A low-FODMAP eating plan may help some people who have irritable bowel syndrome (IBS) and certain other bowel (intestinal) diseases to manage their symptoms. This meal plan can be complicated to follow. Work with a diet and nutrition specialist (dietitian) to make a low-FODMAP eating plan that is right for you. A dietitian can help make sure that you get enough nutrition from this diet. What are tips for following this plan? Reading food labels Check labels for hidden FODMAPs such as: High-fructose syrup. Honey. Agave. Natural fruit flavors. Onion or garlic powder. Choose low-FODMAP foods that contain 3-4 grams of fiber per serving. Check food labels for serving sizes. Eat only one serving at a time to make sure FODMAP levels stay low. [employer] with a list of foods that are recommended on this diet and make a meal plan. Meal planning Follow a low-FODMAP eating plan for up to 6 weeks, or as told by your health care provider or dietitian. To follow the eating plan: Eliminate high-FODMAP foods from your diet completely. Choose only low-FODMAP foods to eat. You will do this for 2-6 weeks. Gradually reintroduce high-FODMAP foods into your diet one at a time. Most people should wait a few days before introducing the next new high-FODMAP food into their meal plan. Your dietitian can recommend how quickly you may reintroduce foods. Keep a  daily record of what and how much you eat and drink. Make note of any symptoms that you have after eating. Review your daily record with a dietitian regularly to identify which foods you can eat and which foods you should avoid. General tips Drink enough fluid each day to keep your urine pale yellow. Avoid processed foods. These often have added sugar and may be high in FODMAPs. Avoid most dairy products, whole grains, and sweeteners. Work with a dietitian to make sure you get enough fiber in your diet. Avoid high FODMAP foods at meals to manage symptoms. Recommended foods Fruits Bananas, oranges, tangerines, lemons, limes, blueberries, raspberries, strawberries, grapes, cantaloupe, honeydew melon, kiwi, papaya, passion fruit, and pineapple. Limited amounts of dried cranberries, banana chips, and shredded coconut. Vegetables Eggplant, zucchini, cucumber, peppers, green beans, bean sprouts, lettuce, arugula, kale, Swiss chard, spinach, collard greens, bok choy, summer squash, potato, and tomato. Limited amounts of corn, carrot, and sweet potato. Green parts of scallions. Grains Gluten-free grains, such as rice, oats, buckwheat, quinoa, corn, polenta, and millet. Gluten-free pasta, bread, or cereal. Rice noodles. Corn tortillas. Meats and other proteins Unseasoned beef, pork, poultry, or fish. Eggs. Berniece Salines. Tofu (firm) and tempeh. Limited amounts of nuts and seeds, such as almonds, walnuts, [place] nuts, pecans, peanuts, nut butters, pumpkin seeds, chia seeds, and sunflower seeds. Dairy Lactose-free milk, yogurt, and kefir. Lactose-free cottage cheese and ice cream. Non-dairy milks, such as almond, coconut, hemp, and rice milk. Non-dairy yogurt. Limited amounts of goat cheese, brie, mozzarella, parmesan, swiss, and other hard cheeses. Fats and oils Butter-free spreads. Vegetable oils, such as olive, canola, and sunflower oil. Seasoning and other foods  Artificial sweeteners with names that do not  end in "ol," such as aspartame, saccharine, and stevia. Maple syrup, white table sugar, raw sugar, brown sugar, and molasses. Mayonnaise, soy sauce, and tamari. Fresh basil, coriander, parsley, rosemary, and thyme. Beverages Water and mineral water. Sugar-sweetened soft drinks. Small amounts of orange juice or cranberry juice. Black and green tea. Most dry wines. Coffee. The items listed above may not be a complete list of foods and beverages you can eat. Contact a dietitian for more information. Foods to avoid Fruits Fresh, dried, and juiced forms of apple, pear, watermelon, peach, plum, cherries, apricots, blackberries, boysenberries, figs, nectarines, and mango. Avocado. Vegetables Chicory root, artichoke, asparagus, cabbage, snow peas, Brussels sprouts, broccoli, sugar snap peas, mushrooms, celery, and cauliflower. Onions, garlic, leeks, and the white part of scallions. Grains Wheat, including kamut, durum, and semolina. Barley and bulgur. Couscous. Wheat-based cereals. Wheat noodles, bread, crackers, and pastries. Meats and other proteins Fried or fatty meat. Sausage. Cashews and pistachios. Soybeans, baked beans, black beans, chickpeas, kidney beans, fava beans, navy beans, lentils, black-eyed peas, and split peas. Dairy Milk, yogurt, ice cream, and soft cheese. Cream and sour cream. Milk-based sauces. Custard. Buttermilk. Soy milk. Seasoning and other foods Any sugar-free gum or candy. Foods that contain artificial sweeteners such as sorbitol, mannitol, isomalt, or xylitol. Foods that contain honey, high-fructose corn syrup, or agave. Bouillon, vegetable stock, beef stock, and chicken stock. Garlic and onion powder. Condiments made with onion, such as hummus, chutney, pickles, relish, salad dressing, and salsa. Tomato paste. Beverages Chicory-based drinks. Coffee substitutes. Chamomile tea. Fennel tea. Sweet or fortified wines such as port or sherry. Diet soft drinks made with isomalt,  mannitol, maltitol, sorbitol, or xylitol. Apple, pear, and mango juice. Juices with high-fructose corn syrup. The items listed above may not be a complete list of foods and beverages you should avoid. Contact a dietitian for more information. Summary FODMAP stands for fermentable oligosaccharides, disaccharides, monosaccharides, and polyols. These are sugars that are hard for some people to digest. A low-FODMAP eating plan is a short-term diet that helps to ease symptoms of certain bowel diseases. The eating plan usually lasts up to 6 weeks. After that, high-FODMAP foods are reintroduced gradually and one at a time. This can help you find out which foods may be causing symptoms. A low-FODMAP eating plan can be complicated. It is best to work with a dietitian who has experience with this type of plan. This information is not intended to replace advice given to you by your health care provider. Make sure you discuss any questions you have with your health care provider. Document Revised: 11/27/2019 Document Reviewed: 11/27/2019 Elsevier Patient Education  [place].

## 2021-05-04 NOTE — Progress Notes (Signed)
05/04/2021 Tracy Malone [id number] [birth year]   Chief Complaint: Constipation, abdominal bloat   History of Present Illness: History of Present Illness: Tracy Malone  is an 87 year old female with a past medical history of Parkinson's disease, fibromyalgia and back pain. She is followed by Dr. Ardis Hughs. She presents to our office today accompanied by her daughter for follow up regarding chronic constipation and abdominal bloat. She has infrequent nausea, no vomiting. She feels like food just sits in her stomach at times. She is taking Miralax QD and Benefiber QOD which results in passing a regular soft formed stool, thin or loose stool daily. No rectal bleeding or black stools. She has intermittent LLQ pain, not severe. She underwent an abd/pelvic CT without contrast 03/07/2021 which showed a duodenal diverticulum otherwise normal bowel. A 2.9cm left adnexal cyst was present, stable since 2020. EGD and colonoscopy were done 10/2020, see results below. Weight remains 98 - 99 lbs.   EGD April 2021 for generalized abdominal pain, anorexia, nausea and weight loss showed mild nonspecific gastritis.  Pathology showed mild gastritis, no H. pylori.   Colonoscopy April 2021 for generalized abdominal pain, constipation, diarrhea and weight loss showed left-sided diverticulosis but was otherwise completely normal.  CBC Latest Ref Rng & Units 03/10/2021 03/07/2021 03/16/2020  WBC 3.4 - 10.8 x10E3/uL 8.4 12.5(H) 8.7  Hemoglobin 11.1 - 15.9 g/dL 13.4 13.9 14.3  Hematocrit 34.0 - 46.6 % 39.5 41.8 45.5  Platelets 150 - 450 x10E3/uL 266 253 316    CMP Latest Ref Rng & Units 03/10/2021 03/07/2021 03/16/2020  Glucose 70 - 99 mg/dL - 99 97  BUN 8 - 23 mg/dL - 13 10  Creatinine 0.44 - 1.00 mg/dL - 0.62 0.62  Sodium 135 - 145 mmol/L - 139 142  Potassium 3.5 - 5.1 mmol/L - 3.8 4.2  Chloride 98 - 111 mmol/L - 101 106  CO2 22 - 32 mmol/L - 29 27  Calcium 8.9 - 10.3 mg/dL - 9.7 9.6  Total Protein 6.0 - 8.5 g/dL  6.1 7.1 -  Total Bilirubin 0.0 - 1.2 mg/dL 0.4 1.4(H) -  Alkaline Phos 44 - 121 IU/L 84 81 -  AST 0 - 40 IU/L 24 135(H) -  ALT 0 - 32 IU/L 19 175(H) -     CTAP 03/07/2021: IMPRESSION: 1. No evidence of acute diverticulitis or other acute pathology in the abdomen or pelvis to explain the patient's pain. 2. Unchanged left adnexal cyst. Follow-up pelvic ultrasound in 1 year could be considered depending on patient goals of care and comorbidities. 3. Sludge and/or small stones in the gallbladder without evidence of acute cholecystitis. 4.  Aortic Atherosclerosis    Past Medical History:  Diagnosis Date   Bell's palsy 1966   Hx: right side facial droop, resolved per patient 04/02/19   Carotid artery disease ([place]) 2010   on vascular screening;unchanged 2013.(could not tolerate simvastatin, no other statins tried)--<30% blockage bilat 07/2011   Chronic abdominal pain    Chronic fatigue and malaise    Claustrophobia    Depression    treated in the past for years;stopped in 2010 for a years   Duodenal ulcer 1962   h/o   Dysrhythmia    ocassional PVC's, no current problems per patient on 04/02/19   Fibromyalgia    Frequent PVCs 07/2012   Seen by [facility]: benign, asymptomatic, normal EF   GERD (gastroesophageal reflux disease)    diet controlled   Glaucoma, narrow-angle    s/p  laser surgery   History of hiatal hernia    during endoscopy   Hypothyroid 9/08   IBS (irritable bowel syndrome)    Dr. Benson [place]   Ischemic colitis [facility]) 11/21/2018   no current problems per patient on 12/29/87   Lichenoid keratosis 38/04/1750   Dr.Stinehelfer   Ocular migraine    Osteoporosis 10/11   Dr.Hawkes   Panic attack    Parkinson disease ([place])    Parkinson's disease ([place]) 06/23/2016   Recurrent UTI    has cystocele-Dr.Grewal   Shingles 1999   h/o   Superficial thrombophlebitis 03/2009   RLE   Trochanteric bursitis 12/2008   bilateral   Current Medications, Allergies, Past Medical History,  Past Surgical History, Family History and Social History were reviewed in [employer] record.  Review of Systems:   Constitutional: Negative for fever, sweats, chills or weight loss.  Respiratory: Negative for shortness of breath.   Cardiovascular: Negative for chest pain, palpitations and leg swelling.  Gastrointestinal: See HPI.  Musculoskeletal: Negative for back pain or muscle aches.  Neurological: Negative for dizziness, headaches or paresthesias.   Physical Exam: LMP  (LMP Unknown)  BP 112/60   Pulse 80   Ht 4\' 11"  (1.499 m)   Wt 98 lb (44.5 kg)   LMP  (LMP Unknown)   BMI 19.79 kg/m   Wt Readings from Last 3 Encounters:  05/04/21 98 lb (44.5 kg)  04/14/21 97 lb 12.8 oz (44.4 kg)  03/24/21 99 lb 3.2 oz (45 kg)    General: 87 year old female in NAD.  Head: Normocephalic and atraumatic. Eyes: No scleral icterus. Conjunctiva pink . Ears: Normal auditory acuity. Mouth: Dentition intact. No ulcers or lesions.  Lungs: Clear throughout to auscultation. Heart: Regular rate and rhythm, no murmur. Abdomen: Soft, nontender and nondistended. No masses or hepatomegaly. Normal bowel sounds x 4 quadrants.  Rectal: Deferred.  Musculoskeletal: Symmetrical with no gross deformities. Extremities: No edema. Neurological: Alert oriented x 4. No focal deficits.  Psychological: Alert and cooperative. Normal mood and affect  Assessment and Recommendations:  59) 87 year old female with chronic constipation, abdominal  boat and intermittent LLQ pain. Colonoscopy 10/2019 showed sigmoid diverticulosis, no polyps. CTAP 02/2021 showed a normal colon.  -Continue Miralax and fiber as tolerated -Ibgard 1 po bid  -Probiotic QD -FODMAP diet  -Follow up PRN  2) 2.9cm left ovarian cyst, stable since 2020 per CT -Follow up with PCP regarding left ovarian cyst

## 2021-05-05 NOTE — Progress Notes (Signed)
I agree with the above note, plan 

## 2021-05-06 DIAGNOSIS — M7061 Trochanteric bursitis, right hip: Secondary | ICD-10-CM | POA: Diagnosis not present

## 2021-05-06 DIAGNOSIS — M5116 Intervertebral disc disorders with radiculopathy, lumbar region: Secondary | ICD-10-CM | POA: Diagnosis not present

## 2021-05-06 DIAGNOSIS — M7062 Trochanteric bursitis, left hip: Secondary | ICD-10-CM | POA: Diagnosis not present

## 2021-05-06 DIAGNOSIS — G20 Parkinson's disease: Secondary | ICD-10-CM | POA: Diagnosis not present

## 2021-05-06 DIAGNOSIS — F411 Generalized anxiety disorder: Secondary | ICD-10-CM | POA: Diagnosis not present

## 2021-05-06 DIAGNOSIS — M7551 Bursitis of right shoulder: Secondary | ICD-10-CM | POA: Diagnosis not present

## 2021-05-06 NOTE — Progress Notes (Signed)
Please have patient hold her Synthroid on Sunday (take 3mcg 6 days/week, rather than 7).  We can recheck her thyroid test at her visit in February, sooner if having thyroid-related symptoms.  I believe she gets her Synthroid through [employer] don't think we necessarily need to change the directions/prescription, so we can refill med when she needs it (per chart, looks like RF is due now, but often patients who get their meds through [place] tell me that they have plenty, so unsure if she needs it refilled or not).

## 2021-05-08 ENCOUNTER — Telehealth: Payer: Self-pay | Admitting: Adult Health

## 2021-05-08 DIAGNOSIS — F411 Generalized anxiety disorder: Secondary | ICD-10-CM

## 2021-05-09 DIAGNOSIS — R1032 Left lower quadrant pain: Secondary | ICD-10-CM | POA: Diagnosis not present

## 2021-05-10 ENCOUNTER — Other Ambulatory Visit: Payer: Self-pay

## 2021-05-10 ENCOUNTER — Telehealth: Payer: Self-pay | Admitting: Adult Health

## 2021-05-10 DIAGNOSIS — F411 Generalized anxiety disorder: Secondary | ICD-10-CM

## 2021-05-10 MED ORDER — ALPRAZOLAM 0.5 MG PO TABS: ORAL_TABLET | ORAL | 2 refills | Status: DC

## 2021-05-10 NOTE — Telephone Encounter (Signed)
Please call to clarify what she needs and pend script.

## 2021-05-10 NOTE — Telephone Encounter (Signed)
Noted  

## 2021-05-10 NOTE — Telephone Encounter (Signed)
Serenidy called  and said that she had her bottle of pills of Alprazolam 0.5 mg but she thinks she accidentally threw them in the trash by mistake. She said she has two left and needs them for her anxiety. Last refill was on 04/12/21 for Xanax #120 with two refills. She was crying on the phone and couldn't understand her. Her number is [phone]. She might have been saying she has two refills left. The last refill was at:  [facility] [id number] [place], [place]  Phone:  [phone]  Fax:  [phone]

## 2021-05-10 NOTE — Telephone Encounter (Signed)
Please approve this one

## 2021-05-10 NOTE — Telephone Encounter (Signed)
Its not too early to fill,but she wanted it at a different pharmacy.Cancelled refills and pended to HS

## 2021-05-10 NOTE — Telephone Encounter (Signed)
Patient's husband called back in asking about alprazolam. States that wife really needs it today because she is completely out. Pharmacy [place]. States Kristopher Oppenheim was denied. Pls rtc [phone]. Doesn't care where its sent as long as its sent today

## 2021-05-10 NOTE — Telephone Encounter (Signed)
Pt called crying stating that she gotten her Xanax last week from [facility].  But she thinks she may have thrown them away by mistake.  She only has 2 pills left and needs a refill.  She was crying and saying she really needed her medication.

## 2021-05-10 NOTE — Telephone Encounter (Signed)
Please review

## 2021-05-11 ENCOUNTER — Telehealth: Payer: Self-pay | Admitting: *Deleted

## 2021-05-11 DIAGNOSIS — M5116 Intervertebral disc disorders with radiculopathy, lumbar region: Secondary | ICD-10-CM | POA: Diagnosis not present

## 2021-05-11 DIAGNOSIS — M7061 Trochanteric bursitis, right hip: Secondary | ICD-10-CM | POA: Diagnosis not present

## 2021-05-11 DIAGNOSIS — M7551 Bursitis of right shoulder: Secondary | ICD-10-CM | POA: Diagnosis not present

## 2021-05-11 DIAGNOSIS — M7062 Trochanteric bursitis, left hip: Secondary | ICD-10-CM | POA: Diagnosis not present

## 2021-05-11 DIAGNOSIS — G20 Parkinson's disease: Secondary | ICD-10-CM | POA: Diagnosis not present

## 2021-05-11 DIAGNOSIS — F411 Generalized anxiety disorder: Secondary | ICD-10-CM | POA: Diagnosis not present

## 2021-05-11 NOTE — Telephone Encounter (Signed)
Took call form phone staff and spoke with Erin Alderman/PT. They are wanting pt to increase protein intake. Wanting to know how she should separate this from her sinemet. Per [name], she should take sinemet 7min or more before a meal. She verbalized understanding.

## 2021-05-12 ENCOUNTER — Telehealth: Payer: Self-pay | Admitting: Pharmacist

## 2021-05-12 DIAGNOSIS — E039 Hypothyroidism, unspecified: Secondary | ICD-10-CM

## 2021-05-12 NOTE — Telephone Encounter (Signed)
-----   Message from [name], MD sent at 05/06/2021 10:06 AM EDT ----- Please see my additional note: re contacting patient with dose change as we discussed.

## 2021-05-12 NOTE — Telephone Encounter (Addendum)
Called patient to relay note from PCP. Patient agreed to not take Synthroid on Sundays and take it all other days of the week. Patient referred to recheck her thryoid level prior to her appt in Feb. Will send message to PCP to order lab.  Per note from PCP:  Please have patient hold her Synthroid on Sunday (take 58mcg 6 days/week, rather than 7).  We can recheck her thyroid test at her visit in February, sooner if having thyroid-related symptoms.  I believe she gets her Synthroid through [employer] don't think we necessarily need to change the directions/prescription, so we can refill med when she needs it (per chart, looks like RF is due now, but often patients who get their meds through [place] tell me that they have plenty, so unsure if she needs it refilled or not).

## 2021-05-12 NOTE — Telephone Encounter (Signed)
Future order was entered for TSH.  Should wait a minimum of 6 weeks.  Can schedule lab visit for 6-8 weeks. Thanks

## 2021-05-16 ENCOUNTER — Telehealth: Payer: Self-pay | Admitting: Adult Health

## 2021-05-16 NOTE — Telephone Encounter (Signed)
Tracy Malone called and said the pills Rollene Fare gave her don't work and would like to go back to the Prozac. She is not feeling good now. Her phone number is [phone].

## 2021-05-16 NOTE — Telephone Encounter (Signed)
Noted. She can try the Prozac - have her take it with food and see if that helps. D/C the [place].

## 2021-05-16 NOTE — Telephone Encounter (Signed)
Pt stated the deplin samples are not helping.She feels more agitated.She stated she would rather go back to prozac and deal with the stomach issues.

## 2021-05-16 NOTE — Telephone Encounter (Signed)
Pt informed

## 2021-05-17 ENCOUNTER — Other Ambulatory Visit: Payer: Self-pay

## 2021-05-17 ENCOUNTER — Ambulatory Visit (INDEPENDENT_AMBULATORY_CARE_PROVIDER_SITE_OTHER): Payer: Medicare Other | Admitting: Psychiatry

## 2021-05-17 ENCOUNTER — Telehealth: Payer: Self-pay | Admitting: Family Medicine

## 2021-05-17 DIAGNOSIS — F411 Generalized anxiety disorder: Secondary | ICD-10-CM

## 2021-05-17 NOTE — Progress Notes (Signed)
[facility] Counselor/Therapist Progress Note  Patient ID: Tracy Malone, MRN: [id number],    Date: 05/17/2021  Time Spent: 58 minutes   Treatment Type: Individual Therapy  Reported Symptoms: anxiety, depression, some  Mental Status Exam:  Appearance:   Neat     Behavior:  Appropriate and Sharing  Motor:  Normal and Uses cane in walking  Speech/Language:   Clear and Coherent  Affect:  Depressed and anxious, some tearfulness  Mood:  anxious and depressed  Thought process:  Some ruminating  Thought content:    Obsessions and Rumination  Sensory/Perceptual disturbances:    WNL  Orientation:  oriented to person, place, time/date, situation, day of week, month of year, year, and stated date of Oct. 25, 2022  Attention:  Fair  Concentration:  Fair  Memory:  [place] of knowledge:   Good  Insight:    Fair  Judgment:   Fair  Impulse Control:  Good   Risk Assessment: Danger to Self:  No Self-injurious Behavior: No Danger to Others: No Duty to Warn:no Physical Aggression / Violence:No  Access to Firearms a concern: No  Gang Involvement:No    Subjective: Patient in today reporting anxiety, depression, and still looking for negatives and making assumptions, will not let go of the negatives especially from the past. Worked hard today on some very necessary "letting go" of the past in order not to feel so sad and feel that she can't be happy.  Patient worked well throughout session on this and seemed to get to a better place by the end of session in terms of really working to "let go of things in the past" that had been holding her back a long long time. Encouraged to continue this work between now and her next session and she agreed.   Interventions: Solution-Oriented/Positive Psychology and Ego-Supportive  Diagnosis:   ICD-10-CM   1. Generalized anxiety disorder  F41.1      Treatment Goal Plan:  Patient not signing treatment plan on computer screen due to  [place]. Treatment Goals: Treatment goals will remain on treatment plan as patient works with strategies to achieve her goals.  Progress will be noted each session and documented in the "progress" section of note. Long term goal: Develop the ability to recognize, accept, and cope with feelings of anxiety and depression. Short term goal: Verbalize any unresolved grief issues that may be contributing to anxiety and depression. Strategy: Replace negative self-defeating self talk with verbalization of realistic and positive cognitive messages to lessen depression/anxiety and improve mood.    Plan:  Patient today showing better motivation and participation in session as she worked the most consistent she is overworked in a session on "letting go of the past in order to move forward".  So much in her past has held her back over the years according to what patient and family tells me and today she put forth more effort and hopefully is going to feel differently about herself and the ability to make changes even at her age.  Encouraged patient to be practicing more of the positive behaviors that we have discussed previously including: Staying on her medications as prescribed, moving about carefully to get some exercise as she is able per her doctor, allowing for good sleep patterns, healthy nutrition, consistent positive self talk, staying in the present focusing on what she can control or change versus cannot, remaining in touch with family and friends that are supportive, reaching out to be involved  in some of the church activities, remaining in contact with the ministers at her church that have visited her and are supportive, continuing the difficult work of letting go of negativity and past hurts and regrets in order to move forward, to believe in herself, and to recognize the strength she shows working with goal-directed behaviors to move forward in a direction that supports improved emotional health and  overall stability.  Goal review and progress/challenges noted with patient.  Next appointment within 2 to 3 weeks.  This record has been created using [employer].  Chart creation errors have been sought, but may not always have been located and corrected.  Such creation errors do not reflect on the standard of medical care provided.    Shanon Ace, LCSW

## 2021-05-17 NOTE — Telephone Encounter (Signed)
Pt called and stated that she did want to have her labs done after she finishes the medication. She said she has 3 more Sundays to go and she will stop

## 2021-05-18 ENCOUNTER — Ambulatory Visit: Payer: Medicare Other | Admitting: Physician Assistant

## 2021-05-19 NOTE — Telephone Encounter (Signed)
Called back patient and had to leave a voicemail. Requested a call back to discuss further.

## 2021-05-19 NOTE — Telephone Encounter (Signed)
Patient called back and I was unavailable. Called her back and set up a lab visit for a repeat TSH in 6 weeks from dose change. Patient is aware.

## 2021-05-19 NOTE — Telephone Encounter (Signed)
Refer to other telephone encounter. Patient has been scheduled for repeat TSH.

## 2021-05-23 ENCOUNTER — Ambulatory Visit: Payer: Medicare Other | Admitting: Family Medicine

## 2021-05-23 DIAGNOSIS — E039 Hypothyroidism, unspecified: Secondary | ICD-10-CM | POA: Diagnosis not present

## 2021-05-23 DIAGNOSIS — F339 Major depressive disorder, recurrent, unspecified: Secondary | ICD-10-CM

## 2021-05-31 ENCOUNTER — Telehealth: Payer: Self-pay | Admitting: Psychiatry

## 2021-05-31 NOTE — Telephone Encounter (Signed)
Pt LVM sounding teary.  She asked for you to call her back.

## 2021-06-01 ENCOUNTER — Ambulatory Visit: Payer: Medicare Other | Admitting: Family Medicine

## 2021-06-02 ENCOUNTER — Telehealth: Payer: Self-pay | Admitting: Psychiatry

## 2021-06-02 NOTE — Telephone Encounter (Signed)
Pt LVM stating that she contacted Medicare and they will not accept a phone visit.  She said maybe she can do the telehealth but she doesn't know how.  She said to let you know this before 10.

## 2021-06-07 ENCOUNTER — Telehealth: Payer: Self-pay | Admitting: Pharmacist

## 2021-06-07 NOTE — Chronic Care Management (AMB) (Signed)
    Chronic Care Management Pharmacy Assistant   Name: ASTOU LADA  MRN: [id number] DOB: [birth year]  Reason for Encounter: General Assessment    Recent office visits:  None  Recent consult visits:  05/17/21 Shanon Ace, LCSW [facility]) - Patient presented for visit no details available.  05/09/21 Dian Queen ([facility]) - Patient presented for LLQ pain. No other visit details available.  05/04/21 Noralyn Pick, NP [facility]) - Patient presented for Abdominal bloating and other concerns. No medication changes.  05/03/21 Suella Broad D ([facility]) - Patient presented for Lumbar radiculopathy. No other visit details available.  Hospital visits:  None in previous 6 months  Medications: Outpatient Encounter Medications as of 06/07/2021  Medication Sig   acetaminophen (TYLENOL) 650 MG CR tablet Take 650 mg by mouth every 8 (eight) hours as needed for pain.   ALPRAZolam (XANAX) 0.5 MG tablet Take four tablets daily as needed for anxiety.   b complex vitamins tablet Take 1 tablet by mouth daily.   Calcium Carbonate (CALCIUM 600 PO) Take 1 capsule by mouth in the morning and at bedtime.   Carbidopa-Levodopa ER (SINEMET CR) 25-100 MG tablet controlled release Take 2 po qAm, one po qPM and 2 po qHS   Cholecalciferol (VITAMIN D) 2000 UNITS tablet Take 2,000 Units by mouth daily.   dicyclomine (BENTYL) 10 MG capsule As needed   Probiotic Product (ALIGN PO) Take 1 capsule by mouth daily.   QUEtiapine (SEROQUEL) 25 MG tablet Take 25 mg by mouth as needed.   SYNTHROID 25 MCG tablet TAKE 1 TABLET DAILY BEFORE BREAKFAST FOR HYPOTHYROIDISM   Facility-Administered Encounter Medications as of 06/07/2021  Medication   0.9 %  sodium chloride infusion   Notes:  Per Jeni Salles call to patient to check on her and inquire about how she is doing with her pain. Patient reports her pain level has remained the same unchanged is always in pain. She reports she thinks she is  having a bladder infection having a lot of pain in her lower back and bladder, no fever,  and wants to move up her appointment with her PCP a bit sooner than Feb. Advised I would reach out to see if we can get her scheduled more urgently and include in that visit labs for her urine or orders for urine test to be done prior if can not move the appointment and get back to her. She was in agreement. Per Jeni Salles offered appointment for 06/09/21 at 11:45 and patient was in agreement.  Care Gaps: Zoster Vaccine - Overdue TDAP - Overdue COVID Booster - Overdue AWV- 5/23 CCM- Need  Star Rating Drugs: None   Ned Clines [place] Clinical Pharmacist Assistant [phone]

## 2021-06-08 ENCOUNTER — Telehealth: Payer: Self-pay

## 2021-06-08 NOTE — Telephone Encounter (Signed)
SW scheduled palliative RN follow-up visit with patient. Visit scheduled for 06/21/21 @ 10:45 am.

## 2021-06-08 NOTE — Telephone Encounter (Signed)
Called the patient to offer an appointment for tomorrow.  Patient has already been scheduled to see her primary care.  She still has concerns of difficulty with eating and upset stomach.  Per the conversation from October 3 patient was advised to cut back to 1 pill of the carbidopa levodopa in the morning and 1 pill in the afternoon.  Patient has been taking 3 a day.  Patient will reduce to 2 a day and see if that helps. Advised the patient to keep her pcp apt tomorrow and also discuss this concern to rule out any other problems. Pt verbalized understanding. Pt would still like to be placed on wait list if something sooner opens up. Advised I would do that.

## 2021-06-09 ENCOUNTER — Other Ambulatory Visit: Payer: Self-pay

## 2021-06-09 ENCOUNTER — Ambulatory Visit: Payer: Medicare Other | Admitting: Family Medicine

## 2021-06-09 ENCOUNTER — Encounter: Payer: Self-pay | Admitting: Family Medicine

## 2021-06-09 ENCOUNTER — Ambulatory Visit (INDEPENDENT_AMBULATORY_CARE_PROVIDER_SITE_OTHER): Payer: Medicare Other | Admitting: Family Medicine

## 2021-06-09 VITALS — BP 128/68 | HR 72 | Temp 98.3°F | Ht 59.0 in | Wt 97.8 lb

## 2021-06-09 DIAGNOSIS — K5900 Constipation, unspecified: Secondary | ICD-10-CM | POA: Diagnosis not present

## 2021-06-09 DIAGNOSIS — R109 Unspecified abdominal pain: Secondary | ICD-10-CM | POA: Diagnosis not present

## 2021-06-09 DIAGNOSIS — M545 Low back pain, unspecified: Secondary | ICD-10-CM

## 2021-06-09 LAB — POCT URINALYSIS DIP (PROADVANTAGE DEVICE)
Bilirubin, UA: NEGATIVE
Blood, UA: NEGATIVE
Glucose, UA: NEGATIVE mg/dL
Ketones, POC UA: NEGATIVE mg/dL
Leukocytes, UA: NEGATIVE
Nitrite, UA: NEGATIVE
Protein Ur, POC: NEGATIVE mg/dL
Specific Gravity, Urine: 1.01
Urobilinogen, Ur: NEGATIVE
pH, UA: 6 (ref 5.0–8.0)

## 2021-06-09 NOTE — Patient Instructions (Signed)
Please try and limit the use of imodium--only use it when having frequent, loose/watery bowel movements (diarrhea). Continue the miralax daily. High fiber diet is recommended  Restart the omeprazole 20mg  once daily.  Take it for 1-2 weeks (until your upper stomach and morning nausea has resolved). If you need to continue it daily, you can (if pain recurs after stopping, restart it and stay on it).  Continue the Align and all of your usual medications.  There is no evidence of any urinary tract infection.  Be sure that you keep eating to keep your strength up. Try and get sleep--that is when your body heals.  Try some of the meditations or visualizations that we talked about.

## 2021-06-09 NOTE — Telephone Encounter (Signed)
Beth please call the patient, the left-sided abdominal pain/swelling is a new symptom.  Pls send her to the lab for CBC and CMP asap and schedule her for an abdominal/pelvic CT scan with oral contrast only as she is allergic to IV contrast asap.  She underwent an EGD 10/2019 which showed mild gastritis  If the CTAP is unrevealing I will order a gastric emptying study   Please instruct the patient to go to the emergency room if she develops severe abdominal pain, vomiting or no bowel movement thank you

## 2021-06-09 NOTE — Progress Notes (Signed)
Chief Complaint  Patient presents with   Back Pain    Low back pain, lower abdominal pain and left sided pain. When she eats she feels like the food is just not moving. No appetite and feels a little nauseous sometimes when she wakes up. No urinary symptoms except frequency, but states she drinks a lot of water.    Patient presents accompanied by her husband with complaints of back pain, abdominal pain. Currently feells bloated--across the top of the stomach, and on the left side. Some nausea a couple of times in the mornings, slight this morning. Resolves on its own. She has not been taking any omeprazole (has in the past).  Bowels are sporadic--sometimes normal, sometimes loose/soft. Can go 1-2 days without a bowel movement, last time this occurred was a week ago. Took 1 imodium yesterday.  She had 3 stools yesterday--first was normal, 2nd was pebbles, 3rd was mushy (not watery).  She took the imodium after this third stool.   Took miralax 2 nights ago.  Has bentyl, uses it periodically, last 3 weeks ago.   PMH, PSH, SH reviewed  Outpatient Encounter Medications as of 06/09/2021  Medication Sig Note   ALPRAZolam (XANAX) 0.5 MG tablet Take four tablets daily as needed for anxiety.    Carbidopa-Levodopa ER (SINEMET CR) 25-100 MG tablet controlled release Take 2 po qAm, one po qPM and 2 po qHS 06/09/2021: Taking 3/day, can't tolerate 5   SYNTHROID 25 MCG tablet TAKE 1 TABLET DAILY BEFORE BREAKFAST FOR HYPOTHYROIDISM 06/09/2021: Taking every day but Sunday   acetaminophen (TYLENOL) 650 MG CR tablet Take 650 mg by mouth every 8 (eight) hours as needed for pain. (Patient not taking: Reported on 06/09/2021) 06/09/2021: Takes prn   b complex vitamins tablet Take 1 tablet by mouth daily. (Patient not taking: Reported on 06/09/2021) 06/09/2021: Will take this afternoon   Calcium Carbonate (CALCIUM 600 PO) Take 1 capsule by mouth in the morning and at bedtime. (Patient not taking: Reported on  06/09/2021) 06/09/2021: Will take this afternoon   Cholecalciferol (VITAMIN D) 2000 UNITS tablet Take 2,000 Units by mouth daily. (Patient not taking: Reported on 06/09/2021) 06/09/2021: Will take this afternoon   dicyclomine (BENTYL) 10 MG capsule As needed (Patient not taking: Reported on 06/09/2021) 06/09/2021: Takes as needed for spasm   Probiotic Product (ALIGN PO) Take 1 capsule by mouth daily. (Patient not taking: Reported on 06/09/2021) 06/09/2021: Will take this afternoon   [DISCONTINUED] QUEtiapine (SEROQUEL) 25 MG tablet Take 25 mg by mouth as needed.    Facility-Administered Encounter Medications as of 06/09/2021  Medication   0.9 %  sodium chloride infusion   Allergies  Allergen Reactions   Iodinated Diagnostic Agents Anaphylaxis   Iodine Anaphylaxis    IV and topical forms. Other reaction(s): Unknown   Levsin [Hyoscyamine Sulfate]     Vision problems/pt has glaucoma   Salmon [Fish Allergy] Hives and Shortness Of Breath   Shellfish Allergy Anaphylaxis   Tramadol Swelling   Remeron [Mirtazapine] Other (See Comments)    Cause blurred vision and red eyes, pt has glaucoma   Aspirin Other (See Comments)    Sever stomach pain due to ulcer scaring.   Bis Subcit-Metronid-Tetracyc Swelling    Tongue swelling. Face tingling Other reaction(s): Unknown   Cephalexin Hives    Other reaction(s): hives   Ciprofloxacin Diarrhea   Codeine Nausea And Vomiting   Cyclobenzaprine Other (See Comments)    Tingly/prickly sensation. Other reaction(s): tingly/prickly sensation   Darvocet [Propoxyphene N-Acetaminophen]  Nausea And Vomiting   Demerol [Meperidine] Nausea Only   Dexlansoprazole Swelling    Redness, swelling and peeling of both feet. Other reaction(s): foot pain   Diphedryl [Diphenhydramine] Other (See Comments)    Increased pulse/small amount ok   Doxycycline Hyclate Other (See Comments)    GI intolerance.   Doxycycline Hyclate     Other reaction(s): GI intolerance    Epinephrine Other (See Comments)    Breathing problems Other reaction(s): breathing problems/fainting   Erythromycin Other (See Comments)    GI intolerance. Other reaction(s): GI   Fish Oil     Other reaction(s): breathing problems/hives   Hyoscyamine     Other reaction(s): eye pain   Latex Other (See Comments)    Gloves ok.  Skin gets red from elastic in underwear and latex bandaides.   Meperidine Hcl     Other reaction(s): vomiting   Nitrofurantoin Diarrhea   Other     Other reaction(s): migraines Other reaction(s): Unknown Other reaction(s): Unknown Other reaction(s): Unknown Other reaction(s): increased pulse, faint, diarrhea   Prednisone Other (See Comments)    Headache Other reaction(s): headache   Ra Diphedryl Allergy [Diphenhydramine Hcl]     Other reaction(s): increased pulse small dose okay   Sertraline Hcl Swelling and Other (See Comments)    Migraine Swelling of tongue/lip (09/2012) Other reaction(s): Unknown   Shellfish-Derived Products     Other reaction(s): Unknown   Sulfa Antibiotics Other (See Comments)    Increased pulse, fainting, diarrhea, thrush   Wellbutrin [Bupropion] Other (See Comments)    Headaches   Xylocaine [Lidocaine Hcl]     With epinephrine, given by dentist.  Speeded up heart rate and she passed out (occured twice, at dentist)   Xylocaine [Lidocaine]     Other reaction(s): Unknown   Biaxin [Clarithromycin] Rash    Started after completing 10 day course of 2000 mg /day, Lips swelling   Ibuprofen Other (See Comments)    Motrin ok with a GI effect. Other reaction(s): rash Motrin okay with a GI effect   ROS: No fever, chills; no bloody/black stools. No bleeding. Some bruising Chronic back pain. No bladder pain. No dysuria. Some occ headaches. No dizzy/LH No URI symptoms.  Some crusting in the nose, using saline spray. +depressed   PHYSICAL EXAM:  BP 128/68   Pulse 72   Temp 98.3 F (36.8 C) (Tympanic)   Ht 4\' 11"  (1.499 m)   Wt  97 lb 12.8 oz (44.4 kg)   LMP  (LMP Unknown)   BMI 19.75 kg/m   Wt Readings from Last 3 Encounters:  06/09/21 97 lb 12.8 oz (44.4 kg)  05/04/21 98 lb (44.5 kg)  04/14/21 97 lb 12.8 oz (44.4 kg)   Thin, elderly female, in no acute distress. HEENT: conjunctiva and sclera are clear, EOMI, wearing mask Neck: no lymphadenopathy or mass Heart: regular rate and rhythm Lungs: clear bilaterally Back--no spinal or CVA tenderness.  There is tenderness on the L lumbar paraspinous area, but no focal spasm, diffusely mildly tender. Abdomen: soft. Active bowel sounds.  She has mild epigastric tenderness.  She is tender at the LLQ--on palpation of this area, there was gurgling/gas movement, and pain level/discomfort fluctuated during the exam (after some of the gas moved around). There was no mass, rebound tenderness or guarding. Skin: Healed scars from shingles L breast Neuro: alert and oriented, normal gait, strength Psych: appears depressed, but not as severe as at prior visits  Urine dip: SG 1.010, negative for leuks,  nitrite, protein, blood   ASSESSMENT/PLAN:  Low back pain, unspecified back pain laterality, unspecified chronicity, unspecified whether sciatica present - she has chronic LBP. Today's exam seems muscular. Cont with her usual measures. Reassured no kidney infection - Plan: POCT Urinalysis DIP (Proadvantage Device)  Abdominal pain, unspecified abdominal location - mainly LLQ and epigastric. Suspect gas is a component, given the gurgling and fluctuating exam. Poss GERD contrib to epigastric and nausea - Plan: POCT Urinalysis DIP (Proadvantage Device)  Constipation, unspecified constipation type - I suspect this may be a component, has chronic L sided abd pain. Discussed high fiber diet avoiding imodium, cont miralax  These complaints are chronic, with periodic flares.  Nothing to suggest diverticulitis, urinary infection, or other significant issue.  Given the ebb/flow/chronicity of  her sx, think likely related to some constipation and IBS issues.  Encouraged high fiber diet, miralax. Discussed omeprazole to help with nausea/epigastric pain (likely just for 1-2 wks, can use prn). Counseled in detail.  She also mentioned a lot of trouble sleeping at night, asking what she could take. We discussed non-pharmacologic techniques for sleep (meditation, visualization, breathing).   I spent 34 minutes dedicated to the care of this patient, including pre-visit review of records, face to face time, post-visit ordering of testing and documentation.  Please try and limit the use of imodium--only use it when having frequent, loose/watery bowel movements (diarrhea). Continue the miralax daily. High fiber diet is recommended  Restart the omeprazole 20mg  once daily.  Take it for 1-2 weeks (until your upper stomach and morning nausea has resolved). If you need to continue it daily, you can (if pain recurs after stopping, restart it and stay on it).  Continue the Align and all of your usual medications.  There is no evidence of any urinary tract infection.  Be sure that you keep eating to keep your strength up. Try and get sleep--that is when your body heals.  Try some of the meditations or visualizations that we talked about.

## 2021-06-13 ENCOUNTER — Other Ambulatory Visit: Payer: Self-pay | Admitting: Nurse Practitioner

## 2021-06-13 ENCOUNTER — Other Ambulatory Visit: Payer: Self-pay

## 2021-06-13 DIAGNOSIS — R1084 Generalized abdominal pain: Secondary | ICD-10-CM

## 2021-06-14 ENCOUNTER — Other Ambulatory Visit (INDEPENDENT_AMBULATORY_CARE_PROVIDER_SITE_OTHER): Payer: Medicare Other

## 2021-06-14 DIAGNOSIS — R1084 Generalized abdominal pain: Secondary | ICD-10-CM

## 2021-06-14 LAB — COMPREHENSIVE METABOLIC PANEL
ALT: 12 U/L (ref 0–35)
AST: 21 U/L (ref 0–37)
Albumin: 4.2 g/dL (ref 3.5–5.2)
Alkaline Phosphatase: 96 U/L (ref 39–117)
BUN: 12 mg/dL (ref 6–23)
CO2: 30 mEq/L (ref 19–32)
Calcium: 9.5 mg/dL (ref 8.4–10.5)
Chloride: 102 mEq/L (ref 96–112)
Creatinine, Ser: 0.71 mg/dL (ref 0.40–1.20)
GFR: 76.21 mL/min (ref >60.00)
Glucose, Bld: 120 mg/dL — ABNORMAL HIGH (ref 70–99)
Potassium: 4 mEq/L (ref 3.5–5.1)
Sodium: 140 mEq/L (ref 135–145)
Total Bilirubin: 1.1 mg/dL (ref 0.2–1.2)
Total Protein: 7.1 g/dL (ref 6.0–8.3)

## 2021-06-14 LAB — CBC WITH DIFFERENTIAL/PLATELET
Basophils Absolute: 0.1 10*3/uL (ref 0.0–0.1)
Basophils Relative: 1.2 % (ref 0.0–3.0)
Eosinophils Absolute: 0.2 10*3/uL (ref 0.0–0.7)
Eosinophils Relative: 2.4 % (ref 0.0–5.0)
HCT: 43.1 % (ref 36.0–46.0)
Hemoglobin: 14.3 g/dL (ref 12.0–15.0)
Lymphocytes Relative: 29.9 % (ref 12.0–46.0)
Lymphs Abs: 2.3 10*3/uL (ref 0.7–4.0)
MCHC: 33.2 g/dL (ref 30.0–36.0)
MCV: 91.6 fl (ref 78.0–100.0)
Monocytes Absolute: 0.6 10*3/uL (ref 0.1–1.0)
Monocytes Relative: 8.3 % (ref 3.0–12.0)
Neutro Abs: 4.5 10*3/uL (ref 1.4–7.7)
Neutrophils Relative %: 58.2 % (ref 43.0–77.0)
Platelets: 348 10*3/uL (ref 150.0–400.0)
RBC: 4.71 Mil/uL (ref 3.87–5.11)
RDW: 13.6 % (ref 11.5–15.5)
WBC: 7.7 10*3/uL (ref 4.0–10.5)

## 2021-06-15 ENCOUNTER — Telehealth: Payer: Self-pay | Admitting: Nurse Practitioner

## 2021-06-15 NOTE — Telephone Encounter (Signed)
Patient called back stating she had a few more questions she needed to ask you.  Please give her a call.  Thank you.

## 2021-06-20 ENCOUNTER — Telehealth: Payer: Self-pay | Admitting: Neurology

## 2021-06-20 ENCOUNTER — Other Ambulatory Visit: Payer: Medicare Other

## 2021-06-20 ENCOUNTER — Other Ambulatory Visit: Payer: Self-pay

## 2021-06-20 DIAGNOSIS — E039 Hypothyroidism, unspecified: Secondary | ICD-10-CM | POA: Diagnosis not present

## 2021-06-20 NOTE — Telephone Encounter (Signed)
Called back to discuss. No answer. Left her a message to call. She had indicated she was feeling better last week.

## 2021-06-20 NOTE — Telephone Encounter (Signed)
Pt had sent a mychart message as well. I have responded to that offering a opening for wed

## 2021-06-20 NOTE — Telephone Encounter (Signed)
Pt called wanting to know if she needs to come in sooner due to the fact she is having some abdominal pain. Pt requesting a call back.

## 2021-06-20 NOTE — Telephone Encounter (Signed)
Patient called requesting to schedule CT said she really needs it is feeling horrible and requested a call back if she does not answer please leave her a voicemail.

## 2021-06-21 ENCOUNTER — Other Ambulatory Visit: Payer: Medicare Other

## 2021-06-21 ENCOUNTER — Other Ambulatory Visit: Payer: Medicare Other | Admitting: *Deleted

## 2021-06-21 ENCOUNTER — Ambulatory Visit (HOSPITAL_BASED_OUTPATIENT_CLINIC_OR_DEPARTMENT_OTHER): Payer: Medicare Other

## 2021-06-21 DIAGNOSIS — Z515 Encounter for palliative care: Secondary | ICD-10-CM

## 2021-06-21 LAB — TSH: TSH: 1.08 u[IU]/mL (ref 0.450–4.500)

## 2021-06-21 NOTE — Progress Notes (Signed)
COMMUNITY PALLIATIVE CARE SW NOTE  PATIENT NAME: Tracy Malone DOB: 11-Sep-1931 MRN: [id number]  PRIMARY CARE PROVIDER: [name], MD  RESPONSIBLE PARTY:  Acct ID - Guarantor Home Phone Work Phone Relationship Acct Type  [id number] Tracy Malone, Tracy [phone] Self P/F     [address], [place] [address]     PLAN OF CARE and INTERVENTIONS:             GOALS OF CARE/ ADVANCE CARE PLANNING:  Goal is for patient to remain at home with her husband. Patient is a DNR.  SOCIAL/EMOTIONAL/SPIRITUAL ASSESSMENT/ INTERVENTIONS:  SW and RN-M. Nadara Mustard completed a follow-up visit with patient her home, where she was present with her husband. Patient report ongoing left side stomach, back, hip and leg pain. Patient report that her appetite remains fair, but she does not have an appetite overall. Her current weight is 97.12 lbs.  She report that she does eat at least 3 small meals a day, but has concerns regarding what to eat. She states that she has has been told different things that she can/cannot eat. The team suggested that she request an referral for a dietician. Patient reported that she started taking her Prozac again about two months ago. Patient report seeing little improvement, but SW noted patient's calm disposition during this visit. Patient was less anxious and did not decreased tearfulness and fretfulness during this visit. Patient became tearful momentarily when she talked about her son. She also expressed concerns regarding her husband declining condition, however they are both content to remaining together in the home. SW briefly discussed patient's interest assisted living-she is not interested.  Patient reported that she had her thyroid check yesterday and is scheduled to see her neurologist tomorrow. Patient remain open to ongoing visits and support by the palliative care team.  PATIENT/CAREGIVER EDUCATION/ COPING:  Patient appear to be coping well.  PERSONAL EMERGENCY  PLAN:  911 can be accessed for emergencies. COMMUNITY RESOURCES COORDINATION/ HEALTH CARE NAVIGATION:  Patient has housekeeping services 1x month. FINANCIAL/LEGAL CONCERNS/INTERVENTIONS:  None.      SOCIAL HX:  Social History   Tobacco Use   Smoking status: Never   Smokeless tobacco: Never  Substance Use Topics   Alcohol use: No    Alcohol/week: 0.0 standard drinks    CODE STATUS: DNR ADVANCED DIRECTIVES: No MOST FORM COMPLETE:  No HOSPICE EDUCATION PROVIDED: No  PPS: Patient continues to ambulate independently, but unsteady at times due to back pain. She is independent for personal care needs.  Patient is having persistent pain to her back, leg and stomach. She is alert and oriented x3.   Duration of visit and documentation: 45 minutes Katheren Puller, LCSW

## 2021-06-22 ENCOUNTER — Encounter: Payer: Self-pay | Admitting: Neurology

## 2021-06-22 ENCOUNTER — Ambulatory Visit (INDEPENDENT_AMBULATORY_CARE_PROVIDER_SITE_OTHER): Payer: Medicare Other | Admitting: Neurology

## 2021-06-22 ENCOUNTER — Telehealth: Payer: Self-pay | Admitting: Nurse Practitioner

## 2021-06-22 VITALS — BP 125/66 | HR 74 | Ht 59.0 in | Wt 96.0 lb

## 2021-06-22 DIAGNOSIS — G20 Parkinson's disease: Secondary | ICD-10-CM

## 2021-06-22 DIAGNOSIS — F418 Other specified anxiety disorders: Secondary | ICD-10-CM | POA: Diagnosis not present

## 2021-06-22 DIAGNOSIS — R194 Change in bowel habit: Secondary | ICD-10-CM | POA: Diagnosis not present

## 2021-06-22 DIAGNOSIS — G250 Essential tremor: Secondary | ICD-10-CM | POA: Diagnosis not present

## 2021-06-22 DIAGNOSIS — R630 Anorexia: Secondary | ICD-10-CM

## 2021-06-22 DIAGNOSIS — R634 Abnormal weight loss: Secondary | ICD-10-CM

## 2021-06-22 DIAGNOSIS — G20A1 Parkinson's disease without dyskinesia, without mention of fluctuations: Secondary | ICD-10-CM

## 2021-06-22 NOTE — Telephone Encounter (Signed)
Tracy Malone, it had been 3 months since she had a CTAP. Since her lower abd pain has worsened a repeat CTAP without contrast (since she is allergic to contrast)  is appropriated. She had labs done a few weeks ago which showed a normal CBC and CMP.  Pls contact patient to schedule CTAP asap. If she develops severe abd pain she will need to go to the ED. I would go ahead and schedule her for a follow up appt with Dr. Ardis Hughs. I will cc him on this msg. THX.

## 2021-06-22 NOTE — Telephone Encounter (Signed)
The patient sends me a message about the CT imaging and the left sided pain.   "I saw my NEUROLOGIST today and he said the pain  in the left lower side and pelvic area need to be addressed with gastrointestinal DR.   It is ALWAYS swollen and painful.   I would like to have an  idea as to what to do.    IBGARD WAS AWFUL  AND it doesn't feel like gas it CONSTANTLY Aches. And i dont sleep and  i am losing weight.   Would like to know what to do.   Right now i feel like i should have done  contrast free ct scan. "   Do you want me to re-activate the CT imaging order?

## 2021-06-22 NOTE — Progress Notes (Addendum)
[facility] RN NOTE  PATIENT NAME: Tracy Malone DOB: [birth year] MRN: [id number]  PRIMARY CARE PROVIDER: [name], MD  RESPONSIBLE PARTY: [name] (spouse) Acct ID - Guarantor Home Phone Work Phone Relationship Acct Type  [id number] EZINNE, [id number] [phone] Self P/F     [address], [place], [place] [address]   Covid-19 Pre-screening Negative  PLAN OF CARE and INTERVENTION:  ADVANCE CARE PLANNING/GOALS OF CARE: Goal is to remain in her home and avoid hospitalizations. She is a Full code. PATIENT/CAREGIVER EDUCATION: Symptom management, pain management, safe mobility DISEASE STATUS: Joint visit made with LCSW, M. Lonon. Met with patient and her spouse in their home. Patient continues to report pain in her left hip that radiates to her left abdomen and down into her left leg. She says that she is considering doing acupuncture. She takes Tylenol which she says takes the edge off. She has difficulty sleeping at night as she is uncomfortable. She does not like taking stronger pain medications due to side effects. She has an unsteady gait at times but remains ambulatory. No recent falls. She uses a cane at times. She remains able to perform tasks such as laundry, bathing/dressing herself, and preparing meals. She says she had labs drawn yesterday ordered by Dr. Tomi Bamberger and she has an appointment with her Neurologist tomorrow. She continues on Carbidopa-Levodopa for her Parkinson's disease. She continues with a right hand tremor. She has a continued decrease in appetite. She appears thinner overall. She is eating 3-4 small meals/day. She says she currently weights 97 lbs. She has tried drinking Boost for nutritional supplementation but feels this caused her to have 4 loose stools after drinking this. She experienced severe pain in her abdomen yesterday and had to take her prn Bentyl, which did help. She has a difficult time figuring what is safe for her  to eat without causing stomach issues. Recommended that she see a dietician. Her mood was improved today. She says she continues to have frequent crying spells, but none during visit today as noted during past visits. She has been back on Prozac for about the past 2 months. She continues to take Xanax for anxiety. Will continue to monitor.  HISTORY OF PRESENT ILLNESS:  This is a 87 yo female with a diagnosis of Parkinson's. She was seen in the ED on 03/07/21 for increasing abdominal pain (left-sided), nausea, fever of 100 at home, chills, fatigue and head/body aches. Patient had stabilized and was released back to home. Palliative care continues to follow patient for symptom management, goals of care and complex decision making.  CODE STATUS: Full code ADVANCED DIRECTIVES: Y MOST FORM: no PPS: 60%   (Duration of visit and documentation 45 minutes)   [name], RN BSN

## 2021-06-22 NOTE — Progress Notes (Signed)
[facility]  PATIENT: Tracy Malone DOB: Jul 09, 1932  REFERRING DOCTOR OR PCP:  Joselyn Arrow SOURCE: patient and EMR records  _________________________________   HISTORICAL  CHIEF COMPLAINT:  Chief Complaint  Patient presents with   Follow-up    Rm 2, w husband. Here for 6 month PD f/u. Pt reports issues with digestion. Feels like her stomach is not empty. Pt not sleeping well and tired all day.     HISTORY OF PRESENT ILLNESS:  Tracy Malone is a 87 y.o.  woman with tremor, anxiety/depression and history of L1 fracture.    Update 06/22/2021: She feels tremor is about the same    She notes it while resting but gets worse when she writes or if she gets upset.. It is also worse when anxious..    She is noting more GI upset.   She feels food does not move out of stomach.   She is losing weight due to poor appetite.    She saw GI.  She was told to change her diet.    She takes some vitamins.  She reduced the Sinemet to 3 times a day and we discussed taking it after meals.       She was placed on prn Bentyl .   She rarely takes ondansetron for nausea.     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    On Sinemet, her gait appeared to improve but the tremor did not change much.  Alprazolam 0.5 mg po qAm,   1 mg qHS has helped her tremor some.  She had a lot of benefit taking propranolol for the tremor but she had hypotension and needed to stop.    Her handwriting is poor.   Tremor is at rest and with intention.       She has anxiety and epression with some crying.  She is not sure how much alprazolam helps.  Prozac helps some but causes stomach problems and headaches.  She sees [facility].   She was on Seroquel 25 mg and sleep was much better.   It was stopped due to a concern about interaction with Sinemet - the interaction is not dangerous.  Also, low dose dod not worse tremor or gait.     She fractured the L1 vertebrae September 2020 while moving a mattress and had kyphoplasty  04/03/2019.   She has also had shots in her back (nerve blocks and ESI) and PT.  She feels PT helped more than the shots..      She had lumbar surgery (Dr. Shon Baton) recently and continues to have pain (back and leg ito the thigh).     Tremor History:      She first noted a right hand tremor in late 2015.    She notes the tremor in the right hand much more than the left.   It worsened with intention but also present at rest   Tremor is not present when asleep.    She has not noted the tremor in the head.   Initially, she did not have any difficulty with her gait or balance.   There is no family history of tremors.   Alprazolam seemed to help the tremor.   Propranolol helped the tremor quite a bit but was poorly tolerated.   She started to have more gait issues around 2019   Sinemet seemed to help the gait but not the tremor.       REVIEW OF SYSTEMS: Constitutional: No fevers,  chills, sweats, or change in appetite Eyes: No visual changes, double vision, eye pain Ear, nose and throat: No hearing loss, ear pain, nasal congestion, sore throat Cardiovascular: No chest pain, palpitations.  Has had palpitations Respiratory:  No shortness of breath at rest or with exertion.   No wheezes GastrointestinaI: No nausea, vomiting, diarrhea, abdominal pain, fecal incontinence Genitourinary:  No dysuria, urinary retention or frequency.  No nocturia. Musculoskeletal:  No neck pain, back pain.  She notes right shoulder pain Integumentary: No rash, pruritus, skin lesions Neurological: as above Psychiatric: Some crying spells..  Some anxiety Endocrine: No palpitations, diaphoresis, change in appetite, change in weigh or increased thirst Hematologic/Lymphatic:  No anemia, purpura, petechiae. Allergic/Immunologic: No itchy/runny eyes, nasal congestion, recent allergic reactions, rashes  ALLERGIES: Allergies  Allergen Reactions   Iodinated Diagnostic Agents Anaphylaxis   Iodine Anaphylaxis    IV and topical  forms. Other reaction(s): Unknown   Levsin [Hyoscyamine Sulfate]     Vision problems/pt has glaucoma   Salmon [Fish Allergy] Hives and Shortness Of Breath   Shellfish Allergy Anaphylaxis   Tramadol Swelling   Remeron [Mirtazapine] Other (See Comments)    Cause blurred vision and red eyes, pt has glaucoma   Aspirin Other (See Comments)    Sever stomach pain due to ulcer scaring.   Bis Subcit-Metronid-Tetracyc Swelling    Tongue swelling. Face tingling Other reaction(s): Unknown   Cephalexin Hives    Other reaction(s): hives   Ciprofloxacin Diarrhea   Codeine Nausea And Vomiting   Cyclobenzaprine Other (See Comments)    Tingly/prickly sensation. Other reaction(s): tingly/prickly sensation   Darvocet [Propoxyphene N-Acetaminophen] Nausea And Vomiting   Demerol [Meperidine] Nausea Only   Dexlansoprazole Swelling    Redness, swelling and peeling of both feet. Other reaction(s): foot pain   Diphedryl [Diphenhydramine] Other (See Comments)    Increased pulse/small amount ok   Doxycycline Hyclate Other (See Comments)    GI intolerance.   Doxycycline Hyclate     Other reaction(s): GI intolerance   Epinephrine Other (See Comments)    Breathing problems Other reaction(s): breathing problems/fainting   Erythromycin Other (See Comments)    GI intolerance. Other reaction(s): GI   Fish Oil     Other reaction(s): breathing problems/hives   Hyoscyamine     Other reaction(s): eye pain   Latex Other (See Comments)    Gloves ok.  Skin gets red from elastic in underwear and latex bandaides.   Meperidine Hcl     Other reaction(s): vomiting   Nitrofurantoin Diarrhea   Other     Other reaction(s): migraines Other reaction(s): Unknown Other reaction(s): Unknown Other reaction(s): Unknown Other reaction(s): increased pulse, faint, diarrhea   Prednisone Other (See Comments)    Headache Other reaction(s): headache   Ra Diphedryl Allergy [Diphenhydramine Hcl]     Other reaction(s):  increased pulse small dose okay   Sertraline Hcl Swelling and Other (See Comments)    Migraine Swelling of tongue/lip (09/2012) Other reaction(s): Unknown   Shellfish-Derived Products     Other reaction(s): Unknown   Sulfa Antibiotics Other (See Comments)    Increased pulse, fainting, diarrhea, thrush   Wellbutrin [Bupropion] Other (See Comments)    Headaches   Xylocaine [Lidocaine Hcl]     With epinephrine, given by dentist.  Speeded up heart rate and she passed out (occured twice, at dentist)   Xylocaine [Lidocaine]     Other reaction(s): Unknown   Biaxin [Clarithromycin] Rash    Started after completing 10 day course of 2000  mg /day, Lips swelling   Ibuprofen Other (See Comments)    Motrin ok with a GI effect. Other reaction(s): rash Motrin okay with a GI effect    HOME MEDICATIONS:  Current Outpatient Medications:    acetaminophen (TYLENOL) 650 MG CR tablet, Take 650 mg by mouth every 8 (eight) hours as needed for pain., Disp: , Rfl:    ALPRAZolam (XANAX) 0.5 MG tablet, Take four tablets daily as needed for anxiety., Disp: 120 tablet, Rfl: 2   b complex vitamins tablet, Take 1 tablet by mouth daily., Disp: , Rfl:    Calcium Carbonate (CALCIUM 600 PO), Take 1 capsule by mouth in the morning and at bedtime., Disp: , Rfl:    Carbidopa-Levodopa ER (SINEMET CR) 25-100 MG tablet controlled release, Take 2 po qAm, one po qPM and 2 po qHS, Disp: 450 tablet, Rfl: 3   Cholecalciferol (VITAMIN D) 2000 UNITS tablet, Take 2,000 Units by mouth daily., Disp: , Rfl:    dicyclomine (BENTYL) 10 MG capsule, As needed, Disp: , Rfl:    FLUoxetine (PROZAC) 10 MG tablet, Take 10 mg by mouth daily., Disp: , Rfl:    Probiotic Product (ALIGN PO), Take 1 capsule by mouth daily., Disp: , Rfl:    SYNTHROID 25 MCG tablet, TAKE 1 TABLET DAILY BEFORE BREAKFAST FOR HYPOTHYROIDISM, Disp: 90 tablet, Rfl: 0  Current Facility-Administered Medications:    0.9 %  sodium chloride infusion, 500 mL, Intravenous, Once,  Rachael Fee, MD  PAST MEDICAL HISTORY: Past Medical History:  Diagnosis Date   Bell's palsy 1966   Hx: right side facial droop, resolved per patient 04/02/19   Carotid artery disease (HCC) 2010   on vascular screening;unchanged 2013.(could not tolerate simvastatin, no other statins tried)--<30% blockage bilat 07/2011   Chronic abdominal pain    Chronic fatigue and malaise    Claustrophobia    Depression    treated in the past for years;stopped in 2010 for a years   Duodenal ulcer 1962   h/o   Dysrhythmia    ocassional PVC's, no current problems per patient on 04/02/19   Fibromyalgia    Frequent PVCs 07/2012   Seen by [facility]: benign, asymptomatic, normal EF   GERD (gastroesophageal reflux disease)    diet controlled   Glaucoma, narrow-angle    s/p laser surgery   History of hiatal hernia    during endoscopy   Hypothyroid 9/08   IBS (irritable bowel syndrome)    Dr. Elnoria Howard   Ischemic colitis (HCC) 11/21/2018   no current problems per patient on 04/02/19   Lichenoid keratosis 12/03/2020   Dr.Stinehelfer   Ocular migraine    Osteoporosis 10/11   Dr.Hawkes   Panic attack    Parkinson disease (HCC)    Parkinson's disease (HCC) 06/23/2016   Recurrent UTI    has cystocele-Dr.Grewal   Shingles 1999   h/o   Superficial thrombophlebitis 03/2009   RLE   Trochanteric bursitis 12/2008   bilateral    PAST SURGICAL HISTORY: Past Surgical History:  Procedure Laterality Date   ABDOMINAL HYSTERECTOMY     BACK SURGERY  05/2019   CATARACT EXTRACTION, BILATERAL  1995, 1996   EYE SURGERY Bilateral    laser - glaucoma   Flexible sigmoidoscopy     KYPHOPLASTY N/A 04/03/2019   Procedure: KYPHOPLASTY L1;  Surgeon: Venita Lick, MD;  Location: [facility] OR;  Service: Orthopedics;  Laterality: N/A;  90 mins   THYROIDECTOMY, PARTIAL  09/2005   L nodule; Dr. Gerrit Friends  TONSILLECTOMY  1946   UPPER GI ENDOSCOPY  06/27/2012   VAGINAL HYSTERECTOMY  1971   and bladder repair.  Still has  ovaries   WISDOM TOOTH EXTRACTION      FAMILY HISTORY: Family History  Problem Relation Age of Onset   Heart disease Mother    Hypertension Mother    Hypertension Sister    HIV Son    Heart disease Brother    Lung cancer Brother        lung   Diabetes Maternal Grandfather    Diabetes Granddaughter        type 1   Colon cancer Neg Hx    Esophageal cancer Neg Hx    Prostate cancer Neg Hx    Stomach cancer Neg Hx    Rectal cancer Neg Hx     SOCIAL HISTORY:  Social History   Socioeconomic History   Marital status: Married    Spouse name: Chrissie Noa   Number of children: 2   Years of education: Not on file   Highest education level: Not on file  Occupational History   Occupation: retired (school system)  Tobacco Use   Smoking status: Never   Smokeless tobacco: Never  Vaping Use   Vaping Use: Never used  Substance and Sexual Activity   Alcohol use: No    Alcohol/week: 0.0 standard drinks   Drug use: No   Sexual activity: Not Currently    Partners: Male    Birth control/protection: Other-see comments    Comment: Hysterectomy  Other Topics Concern   Not on file  Social History Narrative   Married.  Son lives in [place]; Daughter [name] lives in [place]; 2 grandchildren   Social Determinants of Health   Financial Resource Strain: Low Risk    Difficulty of Paying Living Expenses: Not hard at all  Food Insecurity: Not on file  Transportation Needs: No Transportation Needs   Lack of Transportation (Medical): No   Lack of Transportation (Non-Medical): No  Physical Activity: Not on file  Stress: Not on file  Social Connections: Not on file  Intimate Partner Violence: Not on file     PHYSICAL EXAM  Vitals:   06/22/21 1042  BP: 125/66  Pulse: 74  Weight: 96 lb (43.5 kg)  Height: 4\' 11"  (1.499 m)    Body mass index is 19.39 kg/m.   General: The patient is well-developed and well-nourished and in no acute distress  Skin:   No edema.    There were a couple  small subcutaneous nodules in the right arm. .    Neurologic Exam  Mental status: The patient is alert and oriented x 3 at the time of the examination. The patient has apparent normal recent and remote memory, with an apparently normal attention span and concentration ability.   Speech is normal.  Cranial nerves: Extraocular movements are full.   Facial strength is normal.  Trapezius and sternocleidomastoid strength is normal. No dysarthria is noted.    Motor: She does not have bradykinesia.  There is a 6 Hz tremor on the right>> left  hand.  It is worse during rest and with intention.   Tone is normal. No cogwheeling.   Muscle bulk is normal.   Strength is  5 / 5 in all 4 extremities.   Sensory: Sensory testing is intact to touch and vibration sensation in all 4 extremities.  Coordination: Cerebellar testing reveals good finger-nose-finger bilaterally.  Gait and station: Station is normal.   The gait has  a mildly reduced stride, (likely normal for age 12) and normal arm swing.   Gait is difficult to test due to her lumbar fracture and back pain.  She was able to turn 180 degrees in 3 steps (normal for age).  Romberg is negative.   Reflexes: Deep tendon reflexes are symmetric and normal bilaterally.    DIAGNOSTIC DATA (LABS, IMAGING, TESTING) - I reviewed patient records, labs, notes, testing and imaging myself where available.  Lab Results  Component Value Date   WBC 7.7 06/14/2021   HGB 14.3 06/14/2021   HCT 43.1 06/14/2021   MCV 91.6 06/14/2021   PLT 348.0 06/14/2021      Component Value Date/Time   NA 140 06/14/2021 1029   NA 141 09/03/2019 1117   K 4.0 06/14/2021 1029   CL 102 06/14/2021 1029   CO2 30 06/14/2021 1029   GLUCOSE 120 (H) 06/14/2021 1029   BUN 12 06/14/2021 1029   BUN 14 09/03/2019 1117   CREATININE 0.71 06/14/2021 1029   CREATININE 0.75 10/22/2018 1401   CREATININE 0.67 11/23/2016 0815   CALCIUM 9.5 06/14/2021 1029   PROT 7.1 06/14/2021 1029   PROT 6.1  03/10/2021 1547   ALBUMIN 4.2 06/14/2021 1029   ALBUMIN 4.0 03/10/2021 1547   AST 21 06/14/2021 1029   AST 17 10/22/2018 1401   ALT 12 06/14/2021 1029   ALT 7 10/22/2018 1401   ALKPHOS 96 06/14/2021 1029   BILITOT 1.1 06/14/2021 1029   BILITOT 0.4 03/10/2021 1547   BILITOT 0.8 10/22/2018 1401   GFRNONAA >60 03/07/2021 0755   GFRNONAA >60 10/22/2018 1401   GFRAA >60 03/16/2020 1153   GFRAA >60 10/22/2018 1401   Lab Results  Component Value Date   CHOL 198 03/10/2021   HDL 66 03/10/2021   LDLCALC 112 (H) 03/10/2021   TRIG 111 03/10/2021   CHOLHDL 3.0 03/10/2021   Lab Results  Component Value Date   HGBA1C 5.3 10/07/2020   No results found for: VITAMINB12 Lab Results  Component Value Date   TSH 1.080 06/20/2021       ASSESSMENT AND PLAN    1. Parkinson's disease (HCC)   2. Essential tremor   3. Loss of weight   4. Poor appetite   5. Altered bowel habits       1.    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  continue Sinemet CR 25/100 3 pills a day (she felt stomach issues less when she took before a meal an dshe will start doing that.) Continue alprazolam (written by psychiatry) which will help BET some 2.    Continue to use walker for safety 3.    She is on Prozac.   Ok to take low dose Seroquel as a low dose unlikely to have extrapyramidal effects.  Try 1/2 pill or 12.5 mg 4.    She will return to see me in 6 months or as needed if there are new or worsening neurologic symptoms.   Yaasir Menken A. Epimenio Foot, MD, PhD 06/22/2021, 8:30 PM Certified in Neurology, Clinical Neurophysiology, Sleep Medicine, Pain Medicine and Neuroimaging  [facility] [address] [place], [place] [address] [phone]

## 2021-06-22 NOTE — Telephone Encounter (Signed)
Multiple open messages on this patient. She is now scheduled for her CT scan 06/29/21 at [facility] at 1:30 pm. She has been notified.

## 2021-06-22 NOTE — Telephone Encounter (Signed)
Patient called stating she was still having issues with nausea and weight loss and wanted to go ahead and schedule a CT scan.  Please call and advise.

## 2021-06-23 ENCOUNTER — Telehealth: Payer: Self-pay | Admitting: *Deleted

## 2021-06-23 ENCOUNTER — Ambulatory Visit: Payer: Medicare Other | Admitting: Psychiatry

## 2021-06-23 NOTE — Telephone Encounter (Signed)
See phone msg 06/22/2021, CTAP was scheduled.

## 2021-06-23 NOTE — Telephone Encounter (Signed)
Patient called and states that every morning when she wakes up she experiences some pain and burning when she urinates first thing in the morning and does not go very much. The rest of the day she does not have any issues at all. She said she was recently checked for UTI and she did not have one. Just really wants your advice. Do you think she needs an appt?

## 2021-06-23 NOTE — Telephone Encounter (Signed)
Patient advised of Dr.Knapp's recommendations. 

## 2021-06-23 NOTE — Telephone Encounter (Signed)
This may be more related to irritation, or some other cause. If no symptoms throughout the day, it isn't likely an infection.  She likely has atrophic changes (related to age, menopause) that can affect urination.  If it is worsening, she may want to discuss it with her GYN.  As long as it isn't too bothersome or worsening, she doesn't need to rush to see somebody about it right now.

## 2021-06-24 ENCOUNTER — Encounter: Payer: Self-pay | Admitting: Family Medicine

## 2021-06-24 ENCOUNTER — Telehealth: Payer: Self-pay | Admitting: Family Medicine

## 2021-06-24 ENCOUNTER — Ambulatory Visit: Payer: Medicare Other | Admitting: Family Medicine

## 2021-06-24 DIAGNOSIS — R300 Dysuria: Secondary | ICD-10-CM | POA: Diagnosis not present

## 2021-06-24 DIAGNOSIS — N760 Acute vaginitis: Secondary | ICD-10-CM | POA: Diagnosis not present

## 2021-06-24 NOTE — Telephone Encounter (Signed)
Pt called and states she was having painful urination, unable to urinate and blood in urine She saw Dr. Helane Rima today and was given Rx for [facility] but she can't take it because she is allergic to sulfa. She called their office but they have closed for the day and she wants our office to prescribe She states that she was not able to give urine specimen at Dr Gracy Racer but they prescribed based on exam and her symptoms  She also states that she can't take Cipro and Doxycycline  She wants rx sent to [facility] on [address]

## 2021-06-24 NOTE — Telephone Encounter (Signed)
Pt was called and advised to come by and give a sample. Pt husband was at an appointment and she will try to come after he returns.

## 2021-06-24 NOTE — Telephone Encounter (Signed)
Pt does not want the abx ( cipro 250 MG) and will go get the azo relief until she see you on Monday. Bledsoe

## 2021-06-27 ENCOUNTER — Encounter: Payer: Self-pay | Admitting: Family Medicine

## 2021-06-27 ENCOUNTER — Ambulatory Visit (INDEPENDENT_AMBULATORY_CARE_PROVIDER_SITE_OTHER): Payer: Medicare Other | Admitting: Family Medicine

## 2021-06-27 ENCOUNTER — Other Ambulatory Visit: Payer: Self-pay | Admitting: Adult Health

## 2021-06-27 ENCOUNTER — Other Ambulatory Visit: Payer: Self-pay

## 2021-06-27 VITALS — BP 118/64 | HR 72 | Temp 97.4°F | Ht 59.0 in | Wt 97.4 lb

## 2021-06-27 DIAGNOSIS — R300 Dysuria: Secondary | ICD-10-CM

## 2021-06-27 DIAGNOSIS — M79671 Pain in right foot: Secondary | ICD-10-CM

## 2021-06-27 DIAGNOSIS — N3001 Acute cystitis with hematuria: Secondary | ICD-10-CM | POA: Diagnosis not present

## 2021-06-27 DIAGNOSIS — Z889 Allergy status to unspecified drugs, medicaments and biological substances status: Secondary | ICD-10-CM

## 2021-06-27 LAB — POCT URINALYSIS DIP (PROADVANTAGE DEVICE)
Bilirubin, UA: NEGATIVE
Glucose, UA: NEGATIVE mg/dL
Ketones, POC UA: NEGATIVE mg/dL
Nitrite, UA: NEGATIVE
Specific Gravity, Urine: 1.015
Urobilinogen, Ur: NEGATIVE
pH, UA: 6 (ref 5.0–8.0)

## 2021-06-27 MED ORDER — NITROFURANTOIN MONOHYD MACRO 100 MG PO CAPS: 100.0000 mg | ORAL_CAPSULE | Freq: Two times a day (BID) | ORAL | 0 refills | Status: DC

## 2021-06-27 MED ORDER — FLUOXETINE HCL 10 MG PO TABS: 10.0000 mg | ORAL_TABLET | Freq: Every day | ORAL | 0 refills | Status: DC

## 2021-06-27 NOTE — Telephone Encounter (Signed)
Pt left a message that she needs a refill on her prozac 10 mg one daily. Please send to [facility] on [address]

## 2021-06-27 NOTE — Telephone Encounter (Signed)
Pt coming in today to provide sample. Tracy Malone

## 2021-06-27 NOTE — Patient Instructions (Addendum)
Please stay well hydrated. Do not continue to use any Azo. Take the nitrofurantoin (macrobid) twice daily.   Take some probiotics as well, since this has caused diarrhea in the past. We will contact you if therapy needs to be changed based on the culture. Let us know if you develop fever, flank pain, worsening abdominal pain or vomiting.

## 2021-06-27 NOTE — Progress Notes (Signed)
Chief Complaint  Patient presents with   Dysuria    The other day when we spoke she had told me that the pain and burning was only in the morning. Now she is also having some burning and pain after urination during the night as well as first urination in the morning. Not happening during the day. LBP is worse than usual and feels very fatigued. Saw GYN Friday-they did not do a urine test as she could not give sample. Was given Bactrim for some infection upon gynecological exam-she cannot Bactrim.    Friday, 12/2, she had painful urination.  She noted some blood in the urine, noted it on toilet paper when wiping. She saw her GYN that day (not Dr. Helane Rima, but another provider). She states she had a pelvic exam and was told she had an infection.  She didn't provider a urine specimen to them.  She was prescribed an antibiotic that she is allergic to. She was seen late in the day, and the office was closed once she realized that. She took Azo over the weekend, which helped some.  Last dose was yesterday.  Today she feels tired.  After she finished voiding today, she had some pain. She hasn't noticed any blood since Saturday.  She denies any vaginal bleeding, vaginal discharge. She recalls seeing that her vaginal area looked very red.  She dropped something heavy on her R foot yesterday, having pain on the top of the foot. It hurts some to walk.    PMH, PSH, SH reviewed  Outpatient Encounter Medications as of 06/27/2021  Medication Sig Note   Carbidopa-Levodopa ER (SINEMET CR) 25-100 MG tablet controlled release Take 2 po qAm, one po qPM and 2 po qHS 06/09/2021: Taking 3/day, can't tolerate 5   FLUoxetine (PROZAC) 10 MG tablet Take 10 mg by mouth daily.    acetaminophen (TYLENOL) 650 MG CR tablet Take 650 mg by mouth every 8 (eight) hours as needed for pain. (Patient not taking: Reported on 06/27/2021) 06/09/2021: Takes prn   ALPRAZolam (XANAX) 0.5 MG tablet Take four tablets daily as needed for anxiety.  (Patient not taking: Reported on 06/27/2021)    b complex vitamins tablet Take 1 tablet by mouth daily. (Patient not taking: Reported on 06/27/2021) 06/09/2021: Will take this afternoon   Calcium Carbonate (CALCIUM 600 PO) Take 1 capsule by mouth in the morning and at bedtime. (Patient not taking: Reported on 06/27/2021) 06/09/2021: Will take this afternoon   Cholecalciferol (VITAMIN D) 2000 UNITS tablet Take 2,000 Units by mouth daily. (Patient not taking: Reported on 06/27/2021) 06/09/2021: Will take this afternoon   dicyclomine (BENTYL) 10 MG capsule As needed (Patient not taking: Reported on 06/27/2021) 06/09/2021: Takes as needed for spasm   Probiotic Product (ALIGN PO) Take 1 capsule by mouth daily. (Patient not taking: Reported on 06/27/2021) 06/09/2021: Will take this afternoon   sulfamethoxazole-trimethoprim (BACTRIM DS) 800-160 MG tablet Bactrim DS 800 mg-160 mg tablet  Take 1 tablet every 12 hours by oral route. (Patient not taking: Reported on 06/27/2021)    SYNTHROID 25 MCG tablet TAKE 1 TABLET DAILY BEFORE BREAKFAST FOR HYPOTHYROIDISM (Patient not taking: Reported on 06/27/2021) 06/09/2021: Taking every day but Sunday   Facility-Administered Encounter Medications as of 06/27/2021  Medication   0.9 %  sodium chloride infusion   Allergies  Allergen Reactions   Iodinated Diagnostic Agents Anaphylaxis   Iodine Anaphylaxis    IV and topical forms. Other reaction(s): Unknown   Levsin [Hyoscyamine Sulfate]  Vision problems/pt has glaucoma   Salmon [Fish Allergy] Hives and Shortness Of Breath   Shellfish Allergy Anaphylaxis   Tramadol Swelling   Remeron [Mirtazapine] Other (See Comments)    Cause blurred vision and red eyes, pt has glaucoma   Aspirin Other (See Comments)    Sever stomach pain due to ulcer scaring.   Bis Subcit-Metronid-Tetracyc Swelling    Tongue swelling. Face tingling Other reaction(s): Unknown   Cephalexin Hives    Other reaction(s): hives   Ciprofloxacin  Diarrhea   Codeine Nausea And Vomiting   Cyclobenzaprine Other (See Comments)    Tingly/prickly sensation. Other reaction(s): tingly/prickly sensation   Darvocet [Propoxyphene N-Acetaminophen] Nausea And Vomiting   Demerol [Meperidine] Nausea Only   Dexlansoprazole Swelling    Redness, swelling and peeling of both feet. Other reaction(s): foot pain   Diphedryl [Diphenhydramine] Other (See Comments)    Increased pulse/small amount ok   Doxycycline Hyclate Other (See Comments)    GI intolerance.   Doxycycline Hyclate     Other reaction(s): GI intolerance   Epinephrine Other (See Comments)    Breathing problems Other reaction(s): breathing problems/fainting   Erythromycin Other (See Comments)    GI intolerance. Other reaction(s): GI   Fish Oil     Other reaction(s): breathing problems/hives   Hyoscyamine     Other reaction(s): eye pain   Latex Other (See Comments)    Gloves ok.  Skin gets red from elastic in underwear and latex bandaides.   Meperidine Hcl     Other reaction(s): vomiting   Nitrofurantoin Diarrhea   Other     Other reaction(s): migraines Other reaction(s): Unknown Other reaction(s): Unknown Other reaction(s): Unknown Other reaction(s): increased pulse, faint, diarrhea   Prednisone Other (See Comments)    Headache Other reaction(s): headache   Ra Diphedryl Allergy [Diphenhydramine Hcl]     Other reaction(s): increased pulse small dose okay   Sertraline Hcl Swelling and Other (See Comments)    Migraine Swelling of tongue/lip (09/2012) Other reaction(s): Unknown   Shellfish-Derived Products     Other reaction(s): Unknown   Sulfa Antibiotics Other (See Comments)    Increased pulse, fainting, diarrhea, thrush   Wellbutrin [Bupropion] Other (See Comments)    Headaches   Xylocaine [Lidocaine Hcl]     With epinephrine, given by dentist.  Speeded up heart rate and she passed out (occured twice, at dentist)   Xylocaine [Lidocaine]     Other reaction(s): Unknown    Biaxin [Clarithromycin] Rash    Started after completing 10 day course of 2000 mg /day, Lips swelling   Ibuprofen Other (See Comments)    Motrin ok with a GI effect. Other reaction(s): rash Motrin okay with a GI effect   ROS: no fever, chills, URI symptoms. Mild nausea off/on (she relates to her PD meds), no vomiting, diarrhea (bowels are moving, perhaps slight constipation).  +epigastric and L-sided pain (chronic swelling/aching--CT scan is scheduled for Wednesday). No bleeding, or rashes, some easy bruising. R foot pain   PHYSICAL EXAM:  BP 118/64   Pulse 72   Temp (!) 97.4 F (36.3 C) (Tympanic)   Ht 4\' 11"  (1.499 m)   Wt 97 lb 6.4 oz (44.2 kg)   LMP  (LMP Unknown)   BMI 19.67 kg/m   Wt Readings from Last 3 Encounters:  06/27/21 97 lb 6.4 oz (44.2 kg)  06/22/21 96 lb (43.5 kg)  06/09/21 97 lb 12.8 oz (44.4 kg)    Thin, elderly female in no distress.  HEENT: conjunctiva and sclera are clear, EOMI. Wearing mask Neck: no lymphadenopathy Heart: regular rate and rhythm Lungs: clear Abdomen: tender in epigastrium, only minimal suprapubic discomfort. Back: no CVA tenderness GU: Mild erythema, espec on L side, near urethra. No lesions noted. No active lesions or bleeding, no discharge. Pelvic exam not performed (done by GYN 3d ago) Extremities: no edema, normal pulses.  Bruising on the dorsum of the R foot, overlying the 4th and 5th distal metatarsals, and the base of the 5th toe.  No bony stepoff.  Only slight swelling, mainly just bruised.  Psych: mildly depressed but overall affect improved today, not tearful, some smiles Neuro: alert and oriented.  Normal strength.  Walking with only slight antalgic gait, favoring right foot slightly.  She did bear her complete weight getting on/off exam table with mild discomfort.   Urine dip: moderate blood, trace protein, 3+ leuks, nitrite neg. SG 1.015   ASSESSMENT/PLAN:  Acute cystitis with hematuria - urine sent for culture.   Will treat with macrobid as pt states she has tolerated this the best. - Plan: Urine Culture, nitrofurantoin, macrocrystal-monohydrate, (MACROBID) 100 MG capsule  Burning with urination - related to UTI.  Poss some external irritation which has improved (poss blood on TP was from external, less inflamed today per pt report) - Plan: POCT Urinalysis DIP (Proadvantage Device)  Right foot pain - contusion from injury yesterday.  If persistent pain can check x-ray, but suspect just contusion.  Multiple allergies - reviewed ABX allergies and intolerances today. Discussed diarrhea from cipro and macrobid (not allergy)--recalls macrobid tolerated the best  I spent 33 minutes dedicated to the care of this patient, including pre-visit review of records, face to face time, post-visit ordering of testing and documentation.

## 2021-06-28 ENCOUNTER — Encounter: Payer: Self-pay | Admitting: Family Medicine

## 2021-06-29 ENCOUNTER — Other Ambulatory Visit: Payer: Self-pay

## 2021-06-29 ENCOUNTER — Encounter (HOSPITAL_BASED_OUTPATIENT_CLINIC_OR_DEPARTMENT_OTHER): Payer: Self-pay

## 2021-06-29 ENCOUNTER — Ambulatory Visit (HOSPITAL_BASED_OUTPATIENT_CLINIC_OR_DEPARTMENT_OTHER)
Admission: RE | Admit: 2021-06-29 | Discharge: 2021-06-29 | Disposition: A | Discharge status: Home / Self Care | Payer: Medicare Other | Source: Ambulatory Visit | Attending: Nurse Practitioner | Admitting: Nurse Practitioner

## 2021-06-29 DIAGNOSIS — R1084 Generalized abdominal pain: Secondary | ICD-10-CM | POA: Insufficient documentation

## 2021-06-29 DIAGNOSIS — N200 Calculus of kidney: Secondary | ICD-10-CM | POA: Diagnosis not present

## 2021-06-29 DIAGNOSIS — N83202 Unspecified ovarian cyst, left side: Secondary | ICD-10-CM | POA: Diagnosis not present

## 2021-06-30 ENCOUNTER — Ambulatory Visit (INDEPENDENT_AMBULATORY_CARE_PROVIDER_SITE_OTHER): Payer: Medicare Other | Admitting: Psychiatry

## 2021-06-30 DIAGNOSIS — F411 Generalized anxiety disorder: Secondary | ICD-10-CM

## 2021-06-30 NOTE — Progress Notes (Deleted)
[facility] Counselor/Therapist Progress Note  Patient ID: Tracy Malone, MRN: [id number],    Date: 06/30/2021  Time Spent: ***   Treatment Type: {CHL AMB THERAPY TYPES:[id number]}  Reported Symptoms: ***  Mental Status Exam:  Appearance:   {PSY:22683}     Behavior:  {PSY:21022743}  Motor:  {PSY:22302}  Speech/Language:   {PSY:22685}  Affect:  {PSY:22687}  Mood:  {PSY:31886}  Thought process:  {PSY:31888}  Thought content:    {[phone]}  Sensory/Perceptual disturbances:    {PSY:[phone]}  Orientation:  {PSY:30297}  Attention:  {PSY:22877}  Concentration:  {PSY:3304592052}  Memory:  {PSY:361 294 2906}  Fund of knowledge:   {PSY:3304592052}  Insight:    {PSY:3304592052}  Judgment:   {PSY:3304592052}  Impulse Control:  {PSY:3304592052}   Risk Assessment: Danger to Self:  {PSY:22692} Self-injurious Behavior: {PSY:22692} Danger to Others: {PSY:22692} Duty to Warn:{PSY:311194} Physical Aggression / Violence:{PSY:21197} Access to Firearms a concern: {PSY:21197} Gang Involvement:{PSY:21197}  Subjective: ***    Patient in today reporting anxiety, depression, and still looking for negatives and making assumptions, will not let go of the negatives especially from the past. Worked hard today on some very necessary "letting go" of the past in order not to feel so sad and feel that she can't be happy.  Patient worked well throughout session on this and seemed to get to a better place by the end of session in terms of really working to "let go of things in the past" that had been holding her back a long long time. Encouraged to continue this work between now and her next session and she agreed.       Interventions: {PSY:[phone]}      Treatment Goal Plan:  Patient not signing treatment plan on computer screen due to [place]. Treatment Goals: Treatment goals will remain on treatment plan as patient works with strategies to achieve her goals.  Progress will be  noted each session and documented in the "progress" section of note. Long term goal: Develop the ability to recognize, accept, and cope with feelings of anxiety and depression. Short term goal: Verbalize any unresolved grief issues that may be contributing to anxiety and depression. Strategy: Replace negative self-defeating self talk with verbalization of realistic and positive cognitive messages to lessen depression/anxiety and improve mood.   Diagnosis:No diagnosis found.  Plan: ***    Patient today showing better motivation and participation in session as she worked the most consistent she is overworked in a session on "letting go of the past in order to move forward".  So much in her past has held her back over the years according to what patient and family tells me and today she put forth more effort and hopefully is going to feel differently about herself and the ability to make changes even at her age.  Encouraged patient to be practicing more of the positive behaviors that we have discussed previously including: Staying on her medications as prescribed, moving about carefully to get some exercise as she is able per her doctor, allowing for good sleep patterns, healthy nutrition, consistent positive self talk, staying in the present focusing on what she can control or change versus cannot, remaining in touch with family and friends that are supportive, reaching out to be involved in some of the church activities, remaining in contact with the ministers at her church that have visited her and are supportive, continuing the difficult work of letting go of negativity and past hurts and regrets in order to move forward, to believe in  herself, and to recognize the strength she shows working with goal-directed behaviors to move forward in a direction that supports improved emotional health and overall stability.      Shanon Ace, LCSW

## 2021-06-30 NOTE — Progress Notes (Signed)
[facility] Counselor/Therapist Progress Note  Patient ID: Tracy Malone, MRN: [id number],    Date: 06/30/2021  Time Spent: 50 minutes   Treatment Type: Individual Therapy  Reported Symptoms: anxiety, depression is better and med is helping (Prozac)  Mental Status Exam:  Appearance:   Neat     Behavior:  Appropriate, Sharing, and Motivated  Motor:  Normal  Speech/Language:   Clear and Coherent  Affect:  Anxious, some depression  Mood:  anxious and depressed  Thought process:  normal  Thought content:    Rumination and overthinking, some obsessiveness  Sensory/Perceptual disturbances:    WNL  Orientation:  oriented to person, place, time/date, situation, day of week, month of year, year, and stated date of Dec. 8, 2022  Attention:  Fair  Concentration:  Fair  Memory:  "Not too bad, occasional forgetting things"  Fund of knowledge:   Good  Insight:    Good and Fair  Judgment:   Good  Impulse Control:  Good   Risk Assessment: Danger to Self:  No Self-injurious Behavior: No Danger to Others: No Duty to Warn:no Physical Aggression / Violence:No  Access to Firearms a concern: No  Gang Involvement:No   Subjective:  Patient in today reporting anxiety, depression, "but depression is decreased some". Today worked further on decreasing her anxious/negative thoughts that have improved some. Patient noticeably less depressed and affect is more full as she is not tearful and smiling occasionally.  Discussed with her what has been helpful to her in feeling less depressed and she stated "my medication (Prozac) and talking about my issues, and getting out a little more with her husband". They are both needing to be a little more careful and moving about due to age and health, but share today that they ate at a restaurant recently which they had not done in a long time and also have been outside the home a little more.  Patient did discuss a lot of her concerns related to family and  her personal health but was free of any tears at all of the session which is very unusual for her and good to see her feeling better.  She wonders "if it would last" and I encouraged her to keep doing the things she is doing and to focus more on the present going forward versus looking backwards at the way things have been, and to focus on the things that are feeling positive to her now.  Interventions: Solution-Oriented/Positive Psychology and Ego-Supportive  Treatment Goal Plan:  Patient not signing treatment plan on computer screen due to [place]. Treatment Goals: Treatment goals will remain on treatment plan as patient works with strategies to achieve her goals.  Progress will be noted each session and documented in the "progress" section of note. Long term goal: Develop the ability to recognize, accept, and cope with feelings of anxiety and depression. Short term goal: Verbalize any unresolved grief issues that may be contributing to anxiety and depression. Strategy: Replace negative self-defeating self talk with verbalization of realistic and positive cognitive messages to lessen depression/anxiety and improve mood.  Diagnosis:   ICD-10-CM   1. Generalized anxiety disorder  F41.1      Plan:  Patient today showing improved mood, motivation, and active participation in session today.  Her focus was noticeably more positive and she reports remaining on her medication more recently and feels like it is helping her be less depressed and feel like doing more things at home.  Worked in  session more on "letting go of the past in order to move forward", remaining on her medications as prescribed, and discussed some other activities she might enjoy during the day to help keep her as active as she can be considering her physical and emotional status, and encouraged some reduction in the time that she spends on her phone talking and reading things on the Internet that are sometimes not helpful and create  worry.  Encouraged patient and her practice of positive behaviors including: Remaining on her medication as prescribed, consider some other activities for her to be involved in that are of interest of her including reading books and some possible craft work and getting out to [employer] and other places where she and her husband might find it easy to get in and out and feel comfortable being there (considering COVID, flu), moving about carefully to get some exercise as she is able per her doctor, allowing for good sleep patterns including not napping quite as much during the day so maybe she can sleep better at night, healthy nutrition, consistent positive self talk, staying in the present focusing on what she can control or change versus cannot, remaining in touch with family and friends that are supportive and healthy for her she and enjoyed watching the services online intact with the ministers at her church that have visited and are supportive, continuing the difficult work of letting go of negativity and past hurts and regrets in order to move forward, believing in herself more in her ability to make certain changes, and to feel good about the strength she shows working with goal-directed behaviors to move forward in a direction that supports improved emotional health.  Goal review and progress/challenges noted with patient.  Next appointment within 2 to 3 weeks.  This record has been created using [employer].  Chart creation errors have been sought, but may not always have been located and corrected.  Such creation errors do not reflect on the standard of medical care provided.   Shanon Ace, LCSW

## 2021-07-02 LAB — URINE CULTURE

## 2021-07-03 MED ORDER — CIPROFLOXACIN HCL 250 MG PO TABS: 250.0000 mg | ORAL_TABLET | Freq: Two times a day (BID) | ORAL | 0 refills | Status: AC

## 2021-07-04 ENCOUNTER — Encounter: Payer: Self-pay | Admitting: Adult Health

## 2021-07-04 ENCOUNTER — Other Ambulatory Visit: Payer: Self-pay

## 2021-07-04 ENCOUNTER — Ambulatory Visit (INDEPENDENT_AMBULATORY_CARE_PROVIDER_SITE_OTHER): Payer: Medicare Other | Admitting: Adult Health

## 2021-07-04 DIAGNOSIS — F331 Major depressive disorder, recurrent, moderate: Secondary | ICD-10-CM | POA: Diagnosis not present

## 2021-07-04 DIAGNOSIS — F411 Generalized anxiety disorder: Secondary | ICD-10-CM | POA: Diagnosis not present

## 2021-07-04 DIAGNOSIS — G4700 Insomnia, unspecified: Secondary | ICD-10-CM | POA: Diagnosis not present

## 2021-07-04 DIAGNOSIS — F410 Panic disorder [episodic paroxysmal anxiety] without agoraphobia: Secondary | ICD-10-CM

## 2021-07-04 MED ORDER — ALPRAZOLAM 0.5 MG PO TABS: ORAL_TABLET | ORAL | 2 refills | Status: DC

## 2021-07-04 MED ORDER — FLUOXETINE HCL 10 MG PO TABS: 10.0000 mg | ORAL_TABLET | Freq: Every day | ORAL | 5 refills | Status: DC

## 2021-07-04 NOTE — Progress Notes (Signed)
Tracy Malone [id number] 06-Aug-1931 87 y.o.  Subjective:   Patient ID:  Tracy Malone is a 87 y.o. (DOB 1931/10/22) female.  Chief Complaint: No chief complaint on file.   HPI Tracy Malone presents to the office today for follow-up of MDD, GAD, panic attacks and insomnia.  Describes mood today as "ok". Pleasant. Tearful - "at times". Mood symptoms - reports decreased depression and anxiety. Irritability at times. Denies recent panic attacks. Still worries a lot. Stating "I'm doing a little better". Has restarted Prozac 10mg  and is tolerating well. Improved interest and motivation. Taking medications as prescribed. Seeing therapist - Tracy Malone. Energy levels lower. Using a rolling walker today. Active, does not have a regular exercise routine. Enjoys some usual interests and activities. Married. Lives with husband of 25 years. 2 children 59 and 55. Son lives in [place]. Spending time with family. Appetite adequate. Weight loss 94.2. Sleeping difficulties - "it's been bad". Averages 4 hours - then up and down during the night.  Focus and concentration stable. Completing tasks. Managing aspects of household. Retired.  Denies SI or HI.  Denies AH or VH.  Previous medication trials: Multiple medication trials    PHQ2-9    [facility] Office Visit from 03/24/2021 in [facility] Visit from 09/08/2020 in [facility] Visit from 09/03/2019 in [facility] Patient Outreach Telephone from 12/02/2018 in [facility] Visit from 08/29/2018 in [facility]  PHQ-2 Total Score 6 6 6 1 4   PHQ-9 Total Score 17 7 17  -- --      Flowsheet Row ED from 03/07/2021 in [facility]  C-SSRS RISK CATEGORY No Risk        Review of Systems:  Review of Systems  Musculoskeletal:  Negative for gait problem.  Neurological:  Negative for tremors.  Psychiatric/Behavioral:         Please refer to HPI    Medications: I have reviewed the patient's current medications.  Current Outpatient Medications  Medication Sig Dispense Refill   acetaminophen (TYLENOL) 650 MG CR tablet Take 650 mg by mouth every 8 (eight) hours as needed for pain. (Patient not taking: Reported on 06/27/2021)     ALPRAZolam (XANAX) 0.5 MG tablet Take four tablets daily as needed for anxiety. 120 tablet 2   b complex vitamins tablet Take 1 tablet by mouth daily. (Patient not taking: Reported on 06/27/2021)     Calcium Carbonate (CALCIUM 600 PO) Take 1 capsule by mouth in the morning and at bedtime. (Patient not taking: Reported on 06/27/2021)     Carbidopa-Levodopa ER (SINEMET CR) 25-100 MG tablet controlled release Take 2 po qAm, one po qPM and 2 po qHS 450 tablet 3   Cholecalciferol (VITAMIN D) 2000 UNITS tablet Take 2,000 Units by mouth daily. (Patient not taking: Reported on 06/27/2021)     ciprofloxacin (CIPRO) 250 MG tablet Take 1 tablet (250 mg total) by mouth 2 (two) times daily for 7 days. 14 tablet 0   dicyclomine (BENTYL) 10 MG capsule As needed (Patient not taking: Reported on 06/27/2021)     FLUoxetine (PROZAC) 10 MG tablet Take 1 tablet (10 mg total) by mouth daily. 30 tablet 5   nitrofurantoin, macrocrystal-monohydrate, (MACROBID) 100 MG capsule Take 1 capsule (100 mg total) by mouth 2 (two) times daily. 14 capsule 0   Probiotic Product (ALIGN PO) Take 1 capsule by mouth daily. (Patient not taking: Reported on 06/27/2021)     SYNTHROID 25  MCG tablet TAKE 1 TABLET DAILY BEFORE BREAKFAST FOR HYPOTHYROIDISM (Patient not taking: Reported on 06/27/2021) 90 tablet 0   Current Facility-Administered Medications  Medication Dose Route Frequency Provider Last Rate Last Admin   0.9 %  sodium chloride infusion  500 mL Intravenous Once Milus Banister, MD        Medication Side Effects: None  Allergies:  Allergies  Allergen Reactions   Iodinated Diagnostic Agents Anaphylaxis   Iodine Anaphylaxis    IV and topical  forms. Other reaction(s): Unknown   Levsin [Hyoscyamine Sulfate]     Vision problems/pt has glaucoma   Salmon [Fish Allergy] Hives and Shortness Of Breath   Shellfish Allergy Anaphylaxis   Tramadol Swelling   Remeron [Mirtazapine] Other (See Comments)    Cause blurred vision and red eyes, pt has glaucoma   Aspirin Other (See Comments)    Sever stomach pain due to ulcer scaring.   Bis Subcit-Metronid-Tetracyc Swelling    Tongue swelling. Face tingling Other reaction(s): Unknown   Cephalexin Hives    Other reaction(s): hives   Ciprofloxacin Diarrhea   Codeine Nausea And Vomiting   Cyclobenzaprine Other (See Comments)    Tingly/prickly sensation. Other reaction(s): tingly/prickly sensation   Darvocet [Propoxyphene N-Acetaminophen] Nausea And Vomiting   Demerol [Meperidine] Nausea Only   Dexlansoprazole Swelling    Redness, swelling and peeling of both feet. Other reaction(s): foot pain   Diphedryl [Diphenhydramine] Other (See Comments)    Increased pulse/small amount ok   Doxycycline Hyclate Other (See Comments)    GI intolerance.   Doxycycline Hyclate     Other reaction(s): GI intolerance   Epinephrine Other (See Comments)    Breathing problems Other reaction(s): breathing problems/fainting   Erythromycin Other (See Comments)    GI intolerance. Other reaction(s): GI   Fish Oil     Other reaction(s): breathing problems/hives   Hyoscyamine     Other reaction(s): eye pain   Latex Other (See Comments)    Gloves ok.  Skin gets red from elastic in underwear and latex bandaides.   Meperidine Hcl     Other reaction(s): vomiting   Nitrofurantoin Diarrhea   Other     Other reaction(s): migraines Other reaction(s): Unknown Other reaction(s): Unknown Other reaction(s): Unknown Other reaction(s): increased pulse, faint, diarrhea   Prednisone Other (See Comments)    Headache Other reaction(s): headache   Ra Diphedryl Allergy [Diphenhydramine Hcl]     Other reaction(s):  increased pulse small dose okay   Sertraline Hcl Swelling and Other (See Comments)    Migraine Swelling of tongue/lip (09/2012) Other reaction(s): Unknown   Shellfish-Derived Products     Other reaction(s): Unknown   Sulfa Antibiotics Other (See Comments)    Increased pulse, fainting, diarrhea, thrush   Wellbutrin [Bupropion] Other (See Comments)    Headaches   Xylocaine [Lidocaine Hcl]     With epinephrine, given by dentist.  Speeded up heart rate and she passed out (occured twice, at dentist)   Xylocaine [Lidocaine]     Other reaction(s): Unknown   Biaxin [Clarithromycin] Rash    Started after completing 10 day course of 2000 mg /day, Lips swelling   Ibuprofen Other (See Comments)    Motrin ok with a GI effect. Other reaction(s): rash Motrin okay with a GI effect    Past Medical History:  Diagnosis Date   Bell's palsy 1966   Hx: right side facial droop, resolved per patient 04/02/19   Carotid artery disease ([place]) 2010   on vascular screening;unchanged 2013.(could  not tolerate simvastatin, no other statins tried)--<30% blockage bilat 07/2011   Chronic abdominal pain    Chronic fatigue and malaise    Claustrophobia    Depression    treated in the past for years;stopped in 2010 for a years   Duodenal ulcer 1962   h/o   Dysrhythmia    ocassional PVC's, no current problems per patient on 04/02/19   Fibromyalgia    Frequent PVCs 07/2012   Seen by [facility]: benign, asymptomatic, normal EF   GERD (gastroesophageal reflux disease)    diet controlled   Glaucoma, narrow-angle    s/p laser surgery   History of hiatal hernia    during endoscopy   Hypothyroid 9/08   IBS (irritable bowel syndrome)    Dr. Benson [place]   Ischemic colitis ([place]) 11/21/2018   no current problems per patient on 4/0/10   Lichenoid keratosis 27/25/3664   Dr.Stinehelfer   Ocular migraine    Osteoporosis 10/11   Dr.Hawkes   Panic attack    Parkinson disease ([place])    Parkinson's disease ([place]) 06/23/2016    Recurrent UTI    has cystocele-Dr.Grewal   Shingles 1999   h/o   Superficial thrombophlebitis 03/2009   RLE   Trochanteric bursitis 12/2008   bilateral    Past Medical History, Surgical history, Social history, and Family history were reviewed and updated as appropriate.   Please see review of systems for further details on the patient's review from today.   Objective:   Physical Exam:  LMP  (LMP Unknown)   Physical Exam Constitutional:      General: She is not in acute distress. Musculoskeletal:        General: No deformity.  Neurological:     Mental Status: She is alert and oriented to person, place, and time.     Coordination: Coordination normal.  Psychiatric:        Attention and Perception: Attention and perception normal. She does not perceive auditory or visual hallucinations.        Mood and Affect: Mood normal. Mood is not anxious or depressed. Affect is not labile, blunt, angry or inappropriate.        Speech: Speech normal.        Behavior: Behavior normal.        Thought Content: Thought content normal. Thought content is not paranoid or delusional. Thought content does not include homicidal or suicidal ideation. Thought content does not include homicidal or suicidal plan.        Cognition and Memory: Cognition and memory normal.        Judgment: Judgment normal.     Comments: Insight intact    Lab Review:     Component Value Date/Time   NA 140 06/14/2021 1029   NA 141 09/03/2019 1117   K 4.0 06/14/2021 1029   CL 102 06/14/2021 1029   CO2 30 06/14/2021 1029   GLUCOSE 120 (H) 06/14/2021 1029   BUN 12 06/14/2021 1029   BUN 14 09/03/2019 1117   CREATININE 0.71 06/14/2021 1029   CREATININE 0.75 10/22/2018 1401   CREATININE 0.67 11/23/2016 0815   CALCIUM 9.5 06/14/2021 1029   PROT 7.1 06/14/2021 1029   PROT 6.1 03/10/2021 1547   ALBUMIN 4.2 06/14/2021 1029   ALBUMIN 4.0 03/10/2021 1547   AST 21 06/14/2021 1029   AST 17 10/22/2018 1401   ALT 12  06/14/2021 1029   ALT 7 10/22/2018 1401   ALKPHOS 96 06/14/2021 1029   BILITOT 1.1  06/14/2021 1029   BILITOT 0.4 03/10/2021 1547   BILITOT 0.8 10/22/2018 1401   GFRNONAA >60 03/07/2021 0755   GFRNONAA >60 10/22/2018 1401   GFRAA >60 03/16/2020 1153   GFRAA >60 10/22/2018 1401       Component Value Date/Time   WBC 7.7 06/14/2021 1029   RBC 4.71 06/14/2021 1029   HGB 14.3 06/14/2021 1029   HGB 13.4 03/10/2021 1547   HCT 43.1 06/14/2021 1029   HCT 39.5 03/10/2021 1547   PLT 348.0 06/14/2021 1029   PLT 266 03/10/2021 1547   MCV 91.6 06/14/2021 1029   MCV 89 03/10/2021 1547   MCH 30.1 03/10/2021 1547   MCH 30.4 03/07/2021 0755   MCHC 33.2 06/14/2021 1029   RDW 13.6 06/14/2021 1029   RDW 12.2 03/10/2021 1547   LYMPHSABS 2.3 06/14/2021 1029   LYMPHSABS 1.4 03/10/2021 1547   MONOABS 0.6 06/14/2021 1029   EOSABS 0.2 06/14/2021 1029   EOSABS 0.1 03/10/2021 1547   BASOSABS 0.1 06/14/2021 1029   BASOSABS 0.0 03/10/2021 1547    No results found for: POCLITH, LITHIUM   No results found for: PHENYTOIN, PHENOBARB, VALPROATE, CBMZ   .res Assessment: Plan:    Plan:  PDMP reviewed  1. Xanax 0.5mg  - 4 x daily prn anxiety    2. Prozac 10mg  daily    RTC 6 weeks  Patient advised to contact office with any questions, adverse effects, or acute worsening in signs and symptoms.  Discussed potential benefits, risk, and side effects of benzodiazepines to include potential risk of tolerance and dependence, as well as possible drowsiness.  Advised patient not to drive if experiencing drowsiness and to take lowest possible effective dose to minimize risk of dependence and tolerance.  Diagnoses and all orders for this visit:  Generalized anxiety disorder -     FLUoxetine (PROZAC) 10 MG tablet; Take 1 tablet (10 mg total) by mouth daily. -     ALPRAZolam (XANAX) 0.5 MG tablet; Take four tablets daily as needed for anxiety.  Major depressive disorder, recurrent episode, moderate  (HCC)  Panic attacks  Insomnia, unspecified type    Please see After Visit Summary for patient specific instructions.  Future Appointments  Date Time Provider [place]  07/07/2021  3:00 PM Shanon Ace, LCSW CP-CP None  07/28/2021  3:00 PM Shanon Ace, LCSW CP[facility] None  08/03/2021  3:00 PM Shanon Ace, LCSW CP-CP None  08/09/2021 11:00 AM Shanon Ace, LCSW CP[facility]CP None  08/16/2021  3:00 PM Shanon Ace, LCSW CP[facility]CP None  08/31/2021  1:45 PM [name], MD P[facility] [place]  11/30/2021  8:45 AM [name], MD P[facility]PFM [facility]  12/14/2021  1:30 PM Sater, Nanine Means, MD [facility] None    No orders of the defined types were placed in this encounter.   -------------------------------

## 2021-07-07 ENCOUNTER — Other Ambulatory Visit: Payer: Self-pay

## 2021-07-07 ENCOUNTER — Ambulatory Visit (INDEPENDENT_AMBULATORY_CARE_PROVIDER_SITE_OTHER): Payer: Medicare Other | Admitting: Psychiatry

## 2021-07-07 DIAGNOSIS — F411 Generalized anxiety disorder: Secondary | ICD-10-CM

## 2021-07-07 NOTE — Progress Notes (Signed)
[facility] Counselor/Therapist Progress Note  Patient ID: Tracy Malone, MRN: [id number],    Date: 07/07/2021  Time Spent: 55 minutes   Treatment Type: Individual Therapy  Reported Symptoms: anxiety, some depression (improving), some tearfulness  Mental Status Exam:  Appearance:   Neat     Behavior:  Appropriate, Sharing, and Motivated  Motor:  Uses cane or rollater  Speech/Language:   Clear and Coherent  Affect:  Depressed and anxious  Mood:  anxious  Thought process:  normal  Thought content:    Some rumination  Sensory/Perceptual disturbances:    WNL  Orientation:  oriented to person, place, time/date, situation, day of week, month of year, year, and stated date of Dec. 15, 2022  Attention:  Good  Concentration:  Good  Memory:  WNL  Fund of knowledge:   Good  Insight:    Fair  Judgment:   Good and Fair  Impulse Control:  Good and Fair   Risk Assessment: Danger to Self:  No Self-injurious Behavior: No Danger to Others: No Duty to Warn:no Physical Aggression / Violence:No  Access to Firearms a concern: No  Gang Involvement:No   Subjective:    Patient in today reporting anxiety, and some concern about her younger sister in [place] who is having serious health concerns. Some tearfulness and excessive worrying, and hard to let go of worrying, as she has admitted to worrying for a long part of her life. Depression is lessening. Worrying often about lots of people and herself and her husband.  Worked with patient today on her chronic worrying and some things that might help her decrease her worrying and try to be more a person of concern where she still really cares about something but does not get caught up in just looking for what might go wrong versus right.  Encouraged her to work on this more in between sessions, especially identifying and interrupting her pattern of worrying.     Interventions: Solution-Oriented/Positive Psychology and  Ego-Supportive  Treatment Goal Plan:  Patient not signing treatment plan on computer screen due to [place]. Treatment Goals: Treatment goals will remain on treatment plan as patient works with strategies to achieve her goals.  Progress will be noted each session and documented in the "progress" section of note. Long term goal: Develop the ability to recognize, accept, and cope with feelings of anxiety and depression. Short term goal: Verbalize any unresolved grief issues that may be contributing to anxiety and depression. Strategy: Replace negative self-defeating self talk with verbalization of realistic and positive cognitive messages to lessen depression/anxiety and improve mood  Diagnosis:   ICD-10-CM   1. Generalized anxiety disorder  F41.1      Plan: Patient today showing good motivation and participation in session as she worked more on her anxiety and particularly her habit of worrying about everything and everybody.  Does show some progress in this but it is hard for her to maintain.  Did well and working on this in session today as we applied it to some specific worries she has had most recently.  Encouraged patient in her practice of positive behaviors including: Believing more in herself and her ability to make some changes even with long term behaviors that are not healthy for her, remaining on her medication as prescribed, consider some other activities to be involved and that are of interest to her including reading books or some possible craft work or getting out to [employer] that is very close  to where she lives, moving about carefully to get some exercise as she is able per her doctor, allowing for good sleep patterns including not napping as much during the day, healthy nutrition, consistent positive self talk, staying in the present focusing on what she can control or change, remaining in touch with family and friends that are supportive and healthy for her, continue  watching the [place] services from her church that she is really enjoyed, continuing the difficult work of letting go of negativity and past hurts/regrets in order to move forward, and recognize the strengths she shows working with goal-directed behaviors to move forward in a direction that supports improved emotional health and overall wellbeing.  Review and progress/challenges noted with patient.  Next appointment within 2 to 3 weeks.  This record has been created using [employer].  Chart creation errors have been sought, but may not always have been located and corrected.  Such creation errors do not reflect on the standard of medical care provided.   Shanon Ace, LCSW

## 2021-07-20 ENCOUNTER — Telehealth: Payer: Self-pay | Admitting: Adult Health

## 2021-07-20 ENCOUNTER — Telehealth: Payer: Self-pay | Admitting: Family Medicine

## 2021-07-20 DIAGNOSIS — E46 Unspecified protein-calorie malnutrition: Secondary | ICD-10-CM

## 2021-07-20 NOTE — Telephone Encounter (Signed)
Pt called and wants to know If you can recommend a dietician for her to go to, states she has issues with eating states different people are telling her what to eat and what not to eat so she would like to go get some help, and states she is loosing weight,   She can be reached at [phone]

## 2021-07-20 NOTE — Telephone Encounter (Signed)
Please review

## 2021-07-20 NOTE — Telephone Encounter (Signed)
Called patient with this information and she was in agreement with the plan.

## 2021-07-20 NOTE — Telephone Encounter (Signed)
Advise pt that referral was entered for her

## 2021-07-20 NOTE — Telephone Encounter (Signed)
Patient LVM stating that she has been having problems with her Prozac. States that medication is causing both her eyes to be red and painful and some blurriness. Its also suppressing her appetite. She also reports having abdominal pain after she takes the Prozac as well as excessive fatigue. She "will have to stop taking" due to all the issues she's having. Please rtc [phone]

## 2021-07-20 NOTE — Telephone Encounter (Signed)
She can leave off the Prozac - have her advise how symptoms are without it. We can then discuss another medication.

## 2021-07-26 ENCOUNTER — Telehealth: Payer: Self-pay | Admitting: Neurology

## 2021-07-26 NOTE — Telephone Encounter (Signed)
Called the patient back. She has problems with nausea. Advised that this not likely related to the Parkinsons or her medication. She has been on the medication for a long time and per the last visit the way she was taking it seemed to be better. She also discussed having swelling in her abdomen. I have advised that this would need to be addressed with her GI doc. She mentioned having tried zofran for the nausea which didn't help. Pt was appreciative for the return call and will reach out to her gi doc.

## 2021-07-26 NOTE — Telephone Encounter (Signed)
Pt called states her parkinson's medication is making her very nauseous, she is unable to eat and loosing weight. Pt requesting a call back regarding what she should do.

## 2021-07-27 ENCOUNTER — Other Ambulatory Visit: Payer: Self-pay

## 2021-07-27 MED ORDER — ONDANSETRON 4 MG PO TBDP: 4.0000 mg | ORAL_TABLET | Freq: Three times a day (TID) | ORAL | 0 refills | Status: DC | PRN

## 2021-07-27 NOTE — Telephone Encounter (Signed)
Beth, pls contact the patient and schedule her for a follow up appt with Dr. Ardis Hughs (was recommended previously, refer to phone note 06/22/2021).  CTAP x 2 did not explain her sx. She can take Ibgard 1 po tid for her abdominal pain if she hasn't already tried it. She is 89 therefore I prefer not to order dicyclomine. Pls send Rx for Zofran 4mg  ODT 1 tab dissolve on the tongue every 6-8 hours as needed for nausea/vomiting.  I did not order a troponin level so I am not sure why she is asking me about this, defer to her PCP. Thx

## 2021-07-27 NOTE — Telephone Encounter (Signed)
Duplicate msg, see my msg to Beth and mychart reply to patient as follows:  Dear Tracy Malone, I am sorry to hear you are having ongoing left abdominal pain with nausea. My nurse will contact you to schedule a follow up appointment with Dr. Ardis Hughs.  Your abdominal/pelvic CAT scan showed a left ovarian cyst and I recommended for you to follow-up with your gynecologist regarding this result.  You can try IBgard which is peppermint oil and a capsule which alleviates abdominal pain and is purchased over-the-counter.  You can take IBgard 1 capsule 3 times daily as needed.  In regard to your question regarding troponin level, I did not order a troponin level on you.  Reviewing your chart, a troponin level was done 03/07/2021 and was normal.  Please contact your primary care physician if you have further questions regarding your past troponin levels.   Sincerely,

## 2021-07-28 ENCOUNTER — Ambulatory Visit (INDEPENDENT_AMBULATORY_CARE_PROVIDER_SITE_OTHER): Payer: Medicare Other | Admitting: Psychiatry

## 2021-07-28 ENCOUNTER — Telehealth: Payer: Self-pay | Admitting: Pharmacist

## 2021-07-28 ENCOUNTER — Other Ambulatory Visit: Payer: Self-pay

## 2021-07-28 DIAGNOSIS — F411 Generalized anxiety disorder: Secondary | ICD-10-CM

## 2021-07-28 NOTE — Telephone Encounter (Signed)
Patient called to inquire about calcium and vitamin D tablets dosing as she just got her calcium citrate to switch to. Recommended separating her tablets to take BID and she is going to take 400 mg calcium & 500 units vitamin D twice daily. Patient will stop her additional vitamin D supplement and plan to recheck her level at the next visit.

## 2021-07-28 NOTE — Progress Notes (Signed)
[facility] Counselor/Therapist Progress Note  Patient ID: Tracy Malone, MRN: [id number],    Date: 07/28/2021  Time Spent: 55 minutes   Treatment Type: Individual Therapy  Reported Symptoms: anxiety, depression  Mental Status Exam:  Appearance:   Casual     Behavior:  Appropriate, Sharing, and Motivated  Motor:  Uses a rollater when walking   Speech/Language:   Clear and Coherent  Affect:  Depressed and anxious  Mood:  anxious and depressed  Thought process:  goal directed  Thought content:    Rumination and overthinking  Sensory/Perceptual disturbances:    WNL  Orientation:  oriented to person, place, time/date, situation, day of week, month of year, year, and stated date of Jan. 5, 2023  Attention:  Good  Concentration:  Fair  Memory:  [place] of knowledge:   Good  Insight:    Fair  Judgment:   Good and Fair  Impulse Control:  Good   Risk Assessment: Danger to Self:  No Self-injurious Behavior: No Danger to Others: No Duty to Warn:no Physical Aggression / Violence:No  Access to Firearms a concern: No  Gang Involvement:No   Subjective: Patient in today reporting anxiety and depression regarding personal, health, and family issues.  Discouraged today especially about her health. Tends to look more for what may go wrong versus right and has a hard time being optimistic.  Discussed this more in session today and patient was able to reframe some of her thinking and find a couple of positives about her situation, and also discussed the effect that a lot of negative thinking has on [place] emotionally and potentially physically as well.  Patient did seem to respond well to this and used the rest of session to process some family concerns and also her desire to have another contact from her minister which I encouraged as she did enjoy his last home visit and found it helpful.  Worrying is not quite as excessive today nor is her tearfulness.  Does still worry a lot but it seems  to have more of a beginning and ending rather than being quite as pervasive and ongoing.  Is thankful that her son got released from where he was incarcerated and is in a program now to help him prepare for more independent living.  Patient to focus more between sessions on trying to not always look for and focus on the negatives and instead looking for also some positives, which she was able to name a couple right at the end of session.  Husband remains very supportive as they have been together 36 years.  Interventions: Solution-Oriented/Positive Psychology and Ego-Supportive  Treatment Goal Plan:  Patient not signing treatment plan on computer screen due to [place]. Treatment Goals: Treatment goals will remain on treatment plan as patient works with strategies to achieve her goals.  Progress will be noted each session and documented in the "progress" section of note. Long term goal: Develop the ability to recognize, accept, and cope with feelings of anxiety and depression. Short term goal: Verbalize any unresolved grief issues that may be contributing to anxiety and depression. Strategy: Replace negative self-defeating self talk with verbalization of realistic and positive cognitive messages to lessen depression/anxiety and improve mood  Diagnosis:   ICD-10-CM   1. Generalized anxiety disorder  F41.1      Plan:  Patient in today showing motivation and did actively participate in session working more on her anxiety and particularly her excessive worrying at times,  but I do believe the worrying is becoming a little less excessive although still needs to be addressed and she is agreeable to working further on this.  Does notice that when she is not excessively worrying, her day goes better.  Family remains supportive and her husband of 52 years remain supportive.  Also encouraged them to check out senior resources of [place] for some activities that patient might be interested in since she is able  to get out some with assistance.  Encouraged patient to be in touch with her minister as he made a visit several weeks back that she found very helpful and appreciated since she is not feeling comfortable getting out and being in large groups right now.  Patient to continue her practice of positive behaviors including: Believing in herself more in her ability to make changes even with some long term behaviors that are not healthy for her, remain on her medication as prescribed, pay attention to some of the progress she makes even small progress, consider some other activities to be involved in that are of interest to her including reading books or some possible craft work or going to [employer] that is very close to her home, moving about carefully to get some exercise as she is able per her doctor, allowing for good sleep patterns including not napping during the day, healthy nutrition, consistent positive self talk, staying in the present focusing on what she can control or change, remaining in touch with family and friends that are supportive and healthy for her, continue to watch the [employer] services from her church which she enjoys, continuing the difficult work of letting go of negativity and past hurts/regrets/resentments in order to move forward, for every negative thought create to positives, reflect more often on her progress that she is making, and realize the strengths she shows working with goal-directed behaviors to move forward in a direction that supports improved emotional health.  Goal review an progress/challenges noted with patient.  Next appt within 2-3 weeks.  This record has been created using [employer].  Chart creation errors have been sought, but may not always have been located and corrected.  Such creation errors do not reflect on the standard of medical care provided.   Shanon Ace, LCSW

## 2021-08-03 ENCOUNTER — Other Ambulatory Visit: Payer: Self-pay

## 2021-08-03 ENCOUNTER — Ambulatory Visit (INDEPENDENT_AMBULATORY_CARE_PROVIDER_SITE_OTHER): Payer: Medicare Other | Admitting: Psychiatry

## 2021-08-03 DIAGNOSIS — F411 Generalized anxiety disorder: Secondary | ICD-10-CM | POA: Diagnosis not present

## 2021-08-03 NOTE — Progress Notes (Signed)
[facility] Counselor/Therapist Progress Note  Patient ID: OLIVE ZMUDA, MRN: [id number],    Date: 08/03/2021  Time Spent: 50 minutes   Treatment Type: Individual Therapy  Reported Symptoms: anxiety, depression, fearful thoughts, worries  Mental Status Exam:  Appearance:   Well Groomed     Behavior:  Appropriate, Sharing, and Motivated  Motor:  Uses cane or scooter to help when walking  Speech/Language:   Clear and Coherent  Affect:  Depressed and anxious  Mood:  anxious and depressed  Thought process:  Some tangentiality  Thought content:    Obsessions and overthinking  Sensory/Perceptual disturbances:    WNL  Orientation:  oriented to person, place, time/date, situation, day of week, month of year, year, and stated date of Jan. 11, 2023  Attention:  Good  Concentration:  Good  Memory:  WNL  Fund of knowledge:   Good  Insight:    Good and Fair  Judgment:   Good  Impulse Control:  Good   Risk Assessment: Danger to Self:  No Self-injurious Behavior: No Danger to Others: No Duty to Warn:no Physical Aggression / Violence:No  Access to Firearms a concern: No  Gang Involvement:No    Subjective:  Patient in today reporting anxiety, worrying excessively, and depression, but not feeling the depression quite as strong as last session. "I do tend to anticipate trouble or pain."  Reports chronic low back pain, which also affects her mood and feels more anxious or depressed. Worked today on coping with emotional discomfort including various activities that she can be involved in and enjoys at her home, using encouraging quotes, and practicing looking more for the positives and what might go right versus the negatives and what might go wrong.  Admits she has tended to look for the negatives and what might go wrong for a long time but also wants to work on changing that.  Worked with several specific examples that patient could relate to, in session today.  Does notice that her  worrying is not quite as pervasive as it used to be and seems to have a beginning and an ending rather than just ongoing.  She did seem to be more upbeat by the end of session, actually smiling more, and feeling more grounded.  Husband of 66 years remains very supportive, as well as other family members locally, and her church is supportive.  Interventions: Solution-Oriented/Positive Psychology and Ego-Supportive   Treatment Goal Plan:  Patient not signing treatment plan on computer screen due to [place]. Treatment Goals: Treatment goals will remain on treatment plan as patient works with strategies to achieve her goals.  Progress will be noted each session and documented in the "progress" section of note. Long term goal: Develop the ability to recognize, accept, and cope with feelings of anxiety and depression. Short term goal: Verbalize any unresolved grief issues that may be contributing to anxiety and depression. Strategy: Replace negative self-defeating self talk with verbalization of realistic and positive cognitive messages to lessen depression/anxiety and improve mood  Diagnosis:   ICD-10-CM   1. Generalized anxiety disorder  F41.1      Plan: Patient in today showing motivation and active involvement in session focusing more on her anxiety depression, and excessive worrying.  Depression is not as strong as it was last session and her worrying, as noted above, is not as pervasive as it has been in the past but still a significant stressor for patient.  It is to continue to work with  goal-directed behaviors on these issues and following suggestions, as noted above, to make more progress and feel less depressed, anxious, and worried so as to enjoy life more on an ongoing basis even with its challenges.  Encouraged patient to continue her practice of positive behaviors including: Believing in herself more and her ability to make significant changes especially with some long-term behaviors that  are not healthy for her, remain on her medication as prescribed, pay attention to some of the progress she makes even small progress, consider some other activities to be involved and that are of interest to her including reading books, listening to her favorite music, going to the [employer] close to her home, moving about carefully to get some exercise as she is able per her doctor, allowing for good sleep patterns including not napping during the day, healthy nutrition, consistent positive self talk, staying in the present focusing on what she can control or change, remaining in touch with family and friends that are supportive and healthy for her, continue to watch the [employer] services from her church which she enjoys, continuing the difficult work of letting go of negativity and past hurts/regrets/resentments in order to move forward, for every negative thought create 2 positives, and recognize the strength she shows working with goal-directed behaviors to move forward in a direction that supports improved emotional health.  Goal review and progress/challenges noted with patient.  Next appointment within 2 weeks.  This record has been created using [employer].  Chart creation errors have been sought, but may not always have been located and corrected.  Such creation errors do not reflect on the standard of medical care provided.  Shanon Ace, LCSW

## 2021-08-07 ENCOUNTER — Other Ambulatory Visit: Payer: Self-pay | Admitting: Family Medicine

## 2021-08-07 DIAGNOSIS — E039 Hypothyroidism, unspecified: Secondary | ICD-10-CM

## 2021-08-09 ENCOUNTER — Other Ambulatory Visit: Payer: Self-pay

## 2021-08-09 ENCOUNTER — Ambulatory Visit (INDEPENDENT_AMBULATORY_CARE_PROVIDER_SITE_OTHER): Payer: Medicare Other | Admitting: Psychiatry

## 2021-08-09 DIAGNOSIS — F411 Generalized anxiety disorder: Secondary | ICD-10-CM

## 2021-08-09 NOTE — Progress Notes (Signed)
[facility] Counselor/Therapist Progress Note  Patient ID: Tracy Malone, MRN: [id number],    Date: 08/09/2021  Time Spent: 50 minutes   Treatment Type: Individual Therapy  Reported Symptoms: anxiety, irritability, worrying  Mental Status Exam:  Appearance:   Neat     Behavior:  Appropriate and Sharing  Motor:  Uses cane or scooter when walking  Speech/Language:   Clear and Coherent  Affect:  Depressed and Tearful  Mood:  anxious and depressed  Thought process:  goal directed  Thought content:    Rumination  Sensory/Perceptual disturbances:    WNL  Orientation:  oriented to person, place, time/date, situation, day of week, month of year, year, and stated date of Jan. 17, 2023  Attention:  Good  Concentration:  Fair  Memory:  [place] of knowledge:   Good  Insight:    Good and Fair  Judgment:   Good  Impulse Control:  Good   Risk Assessment: Danger to Self:  No Self-injurious Behavior: No Danger to Others: No Duty to Warn:no Physical Aggression / Violence:No  Access to Firearms a concern: No  Gang Involvement:No   Subjective:  Patient in today reporting worrying, anxiety, and depression. Irritable. Focused today on her negativity, over-worrying, irritability, and setting for realistic limits, not laying around all day in order to sleep better at night. Some intermittent tearfulness and getting stuck in her negative thinking but was able to transition to some more positive thinking with the suggestion of her therapist.  Continues the process of trying to look for fewer negatives and more positives and this is a work in progress.  She does continue to deal with some chronic pain and other issues from her Parkinson's disease.  Talked about her difficulties in living with pain and still trying to have some quality of life and she does do better when she does not lie around all day and instead gets up and moves around a bit and then usually sleeps better at night.  She does  feel that her worrying is not as often as on previous occasions and she does seem to have made some progress in this.  Interventions: Solution-Oriented/Positive Psychology, Ego-Supportive, and Insight-Oriented  Treatment Goal Plan:  Patient not signing treatment plan on computer screen due to [place]. Treatment Goals: Treatment goals will remain on treatment plan as patient works with strategies to achieve her goals.  Progress will be noted each session and documented in the "progress" section of note. Long term goal: Develop the ability to recognize, accept, and cope with feelings of anxiety and depression. Short term goal: Verbalize any unresolved grief issues that may be contributing to anxiety and depression. Strategy: Replace negative self-defeating self talk with verbalization of realistic and positive cognitive messages to lessen depression/anxiety and improve mood  Diagnosis:   ICD-10-CM   1. Generalized anxiety disorder  F41.1      Plan: Patient today showing motivation and engagement in session as she continued her work on her chronic worrying, anxiety, depression, and irritability.  Focused a lot on her over worrying and negativity today, in addition to trying to allow for more positive thinking and behaviors and eliminating the worry as that does nothing but send her in a negative direction with her thoughts and behaviors.  She actually agreed somewhat with this and admits that this is a very hard thing to change as she has been this way for a long time.  Continue to encourage her to work on  these in sessions and her family seems to notice some progress as well.  Encouraged patient to continue her efforts to practice more positive behaviors including: Looking for and acknowledging any progress she makes more often, believing in herself more in her ability to make significant changes especially with some long term behaviors that are not healthy for her, remain on her medications as  prescribed, consider some other activities to be involved in that are of interest to her including reading books and listening to her favorite music and going to the [employer] close to her home, move about carefully to get some exercise as she is able per her doctor, allowing for good sleep patterns including not napping a lot during the day, healthy nutrition, consistent positive self talk, staying in the present focusing on what she can control or change, remaining in touch with family and friends that are supportive and healthy for her, continue to watch the livestream services from her church which she enjoys, continuing the difficult work of letting go of negativity and past hurts/regrets/resentments in order to move forward, for every negative thought create 2 positives, and feel good about the strength that she shows working with goal-directed behaviors to move forward in a direction that supports improved emotional health.  Goal review and progress/challenges noted with patient.  Next appointment within 3 weeks.  This record has been created using [employer].  Chart creation errors have been sought, but may not always have been located and corrected.  Such creation errors do not reflect on the standard of medical care provided.   Shanon Ace, LCSW

## 2021-08-16 ENCOUNTER — Other Ambulatory Visit: Payer: Self-pay

## 2021-08-16 ENCOUNTER — Ambulatory Visit (INDEPENDENT_AMBULATORY_CARE_PROVIDER_SITE_OTHER): Payer: Medicare Other | Admitting: Psychiatry

## 2021-08-16 DIAGNOSIS — F411 Generalized anxiety disorder: Secondary | ICD-10-CM

## 2021-08-16 NOTE — Progress Notes (Signed)
[facility] Counselor/Therapist Progress Note  Patient ID: Tracy Malone, MRN: [id number],    Date: 08/16/2021  Time Spent: 58 minutes   Treatment Type: Individual Therapy  Reported Symptoms: anxiety, "Not really depressed more recently"  Mental Status Exam:  Appearance:   Casual     Behavior:  Appropriate, Sharing, and Motivated  Motor:  Walks with a cane or rollator  Speech/Language:   Clear and Coherent  Affect:  Anxious  Mood:  anxious  Thought process:  Some tangentiality  Thought content:    Rumination and overthinking  Sensory/Perceptual disturbances:    WNL  Orientation:  oriented to person, place, time/date, situation, day of week, month of year, year, and stated date of Jan. 24, 2023  Attention:  Good  Concentration:  Good and Fair  Memory:  [place] of knowledge:   Good  Insight:    Good and Fair  Judgment:   Good  Impulse Control:  Good   Risk Assessment: Danger to Self:  No Self-injurious Behavior: No Danger to Others: No Duty to Warn:no Physical Aggression / Violence:No  Access to Firearms a concern: No  Gang Involvement:No   Subjective:  Patient in today reporting symptoms of anxiety, frustration, some occasional tearfulness, depression has decreased "and I don't feel it now." Gets upset when trying to do things she can't do things she used to do "and I get aggravated." Having hard time sticking with things she can do and things she shouldn't be doing. Processed this more today and agreed to stick more regularly with her limitations and not put herself at risk for more injuries. Sleep is some better and states her meds and her counting has been helpful. States if I don't sleep well , I end up being irritable. Expressed her fears of Parkinsons disease progressing, which we processed in more depth today. Not as negative and she feels she is worrying less but husband has said felt that way. Some decrease in irritability.  Discussed the fact they may be  looking into a community retirement home.  Encouraged to get up and move around some each day and to not isolate at home.  Interventions: Solution-Oriented/Positive Psychology, Ego-Supportive, and Insight-Oriented  Treatment Goal Plan:  Patient not signing treatment plan on computer screen due to [place]. Treatment Goals: Treatment goals will remain on treatment plan as patient works with strategies to achieve her goals.  Progress will be noted each session and documented in the "progress" section of note. Long term goal: Develop the ability to recognize, accept, and cope with feelings of anxiety and depression. Short term goal: Verbalize any unresolved grief issues that may be contributing to anxiety and depression. Strategy: Replace negative self-defeating self talk with verbalization of realistic and positive cognitive messages to lessen depression/anxiety and improve mood   Diagnosis:   ICD-10-CM   1. Generalized anxiety disorder  F41.1      Plan: Patient today showing motivation and good participation as she worked on decreasing her worrying, her anxiety, and she reports that her depression has not really been a problem lately.  Showed good motivation in talking more openly today and is working to stay within her limits as suggested by her doctor for example, do not be lifting heavy objects, be careful moving about the home, getting out some as she is able, and definitely not isolating herself at home.  Husband continues to be very supportive.  Encouraged patient in her continued work on decreasing her worrying and  not assuming worst case scenarios.  Also encouraged her in practicing more positive behaviors including: Reflecting on the progress she makes more often, believing in herself more in her ability to make significant changes especially with some long term behaviors that are not healthy for her, remain on her medications as prescribed, consider some other activities to be involved in  that are of interest to her including reading books and listening to her favorite music and going to the [employer] close to her home, move about carefully to get some exercise as she is able per her doctor, allow for good sleep patterns including not napping a lot during the day, healthy nutrition, consistent positive self talk, staying in the present focusing on what she can control or change, remaining in touch with family and friends that are supportive and healthy for her, continue to watch the [employer] services from her church which she enjoys, continuing the difficult work of letting go of negativity and past hurts/regrets/resentments in order to move forward, for every negative thought create 2 positives, and realize the strength she shows working with goal-directed behaviors to move forward in a direction that supports improved emotional health and overall wellbeing.  Goal review and progress/challenges noted with patient.  Next appointment within 2 to 3 weeks.  This record has been created using [employer].  Chart creation errors have been sought, but may not always have been located and corrected.  Such creation errors do not reflect on the standard of medical care provided.  Shanon Ace, LCSW

## 2021-08-18 ENCOUNTER — Encounter: Payer: Self-pay | Admitting: Family Medicine

## 2021-08-18 DIAGNOSIS — N390 Urinary tract infection, site not specified: Secondary | ICD-10-CM | POA: Diagnosis not present

## 2021-08-18 DIAGNOSIS — N83209 Unspecified ovarian cyst, unspecified side: Secondary | ICD-10-CM | POA: Diagnosis not present

## 2021-08-22 ENCOUNTER — Ambulatory Visit: Payer: Medicare Other | Admitting: Adult Health

## 2021-08-23 NOTE — Telephone Encounter (Signed)
Pt would like a call from the nurse to discuss Carbidopa-Levodopa ER (SINEMET CR) 25-100 MG tablet controlled releaseis not working. Have little tremor in mouth,  tremor in right arm is worse, still having stomach issues ( will reschedule appt with GI physician).  Pt is at daughter's house: [phone]

## 2021-08-23 NOTE — Telephone Encounter (Signed)
Called pt back at number provided. Phone continued to ring, unable to leave message. Will try again tomorrow.

## 2021-08-24 ENCOUNTER — Encounter: Payer: Self-pay | Admitting: Gastroenterology

## 2021-08-24 ENCOUNTER — Ambulatory Visit (INDEPENDENT_AMBULATORY_CARE_PROVIDER_SITE_OTHER): Payer: Medicare Other | Admitting: Gastroenterology

## 2021-08-24 VITALS — BP 110/72 | HR 70 | Ht 59.0 in | Wt 96.0 lb

## 2021-08-24 DIAGNOSIS — R109 Unspecified abdominal pain: Secondary | ICD-10-CM

## 2021-08-24 DIAGNOSIS — K5900 Constipation, unspecified: Secondary | ICD-10-CM

## 2021-08-24 DIAGNOSIS — G8929 Other chronic pain: Secondary | ICD-10-CM | POA: Diagnosis not present

## 2021-08-24 MED ORDER — POLYETHYLENE GLYCOL 3350 17 G PO PACK: PACK | ORAL | 0 refills | Status: DC

## 2021-08-24 NOTE — Patient Instructions (Signed)
If you are age 86 or older, your body mass index should be between 23-30. Your Body mass index is 19.39 kg/m. If this is out of the aforementioned range listed, please consider follow up with your Primary Care Provider.  ________________________________________________________  The [facility] providers would like to encourage you to use [facility] to communicate with providers for non-urgent requests or questions.  Due to long hold times on the telephone, sending your provider a message by [facility] may be a faster and more efficient way to get a response.  Please allow 48 business hours for a response.  Please remember that this is for non-urgent requests.  _______________________________________________________  INCREASE: Miralax to 2 doses daily.  CONTINUE: Colace  You will follow up in our office on an as needed basis.  Thank you for entrusting me with your care and choosing [facility].  Dr Ardis Hughs

## 2021-08-24 NOTE — Progress Notes (Signed)
Review of previous GI testing; Dr. Benson Norway and changed to Dr. Ardis Hughs 10/2014:  -CBC, CMP, and TSH were normal in 07/2014 and again in 08/2014. CT scan02/26/2016. This was done without contrast due to dye allergy.  This showed no acute findings in the abdomen and pelvis specifically to explain her history of abdominal pain. There is a 3.0 cm left ovarian cystic lesion that is likely benign. As I stated above, she is following with gynecology and they're going to monitor that, but they think that it is benign.  Stomach, duodenum, small bowel, and colon grossly unremarkable.Abdominal ultrasound on 09/04/2014 showed no acute or chronic abnormalities of the hepatobiliary tree; evaluation of the pancreatic tail was limited. There is no acute abnormalities of the kidney demonstrated. A 1.6 cm diameter mid to lower pole cyst was noted in the right kidney.  August 2015 HIDA scan with CCK, which was normal.  This was performed for complaints of upper abdominal pain, appetite loss, fatigue, and weight loss at that time as well.EGD was in December 2013 at which time she was found to have moderate gastritis. Biopsies actually showed H. Pylori with chronic active gastritis, however, after taking 1 dose of the Pylera medication she discontinued it because of side effects (it made her feel horrible).  Therefore, she has not completed treatment for H. Pylori. Duodenal biopsies were unremarkable at that time.  flexible sigmoidoscopy also in December 2013 that was normal with only medium hemorrhoids noted. This was performed for diarrhea and biopsies of the descending and sigmoid colon were unremarkable. She states that she had a full colonoscopy prior to that with Dr. Benson Norway, however, after calling his office they stated they have never performed a full colonoscopy on her. Gastric emptying scan in January 2014 showed 33% of the ingested activity remaining in the stomach which is mildly abnormal compared to the normal range of less than 30%  of the ingested activity remaining at 120 minutes.  Repeat GES 09/2014 was normal.  EGD 11/2014 with Dr. Ardis Hughs showed pan-gastritis with positive Hpylori on biopsies; also small hiatal hernia.  Saw ID and was apparently treated with subsequent testing negative.  Repeat abdominal ultrasound 09/2015 was normal.  07/2016 H. Pylori stool antigen was negative. Repeat EGD 08/2016: mild gastritis, + for H pylori again. Referred to ID and tried a regimen but could not complete it due   2.  Presumed ischemic colitis.  April 2020, CT scan suggested "mild circumferential wall thickening about the transverse colon, which may be related to incomplete distention or possibly mild colitis".     EGD April 2021 for generalized abdominal pain, anorexia, nausea and weight loss showed mild nonspecific gastritis.  Pathology showed mild gastritis, no H. pylori.   Colonoscopy April 2021 for generalized abdominal pain, constipation, diarrhea and weight loss showed left-sided diverticulosis but was otherwise completely normal.   HPI: This is a pleasant 88 year old woman   She was last here in our office and she saw Jaclyn Shaggy about 3-1/2 months ago.  She was bothered by constipation, abdominal bloating, left lower quadrant discomforts.  Colleen recommended that she continue his MiraLAX and fiber as tolerated.  That she take IBgard probiotic 1 tab twice daily, that she try FODMAP diet and follow-up as needed.  Colleen arranged another CT scan abdomen pelvis December 2022 and this showed no clear etiology of her symptoms.  Blood work November 2022 TSH was normal, CBC was normal  Urine culture December 2022 grew Proteus Mirabilis  Her weight is down  2 pounds since her last office visit here 2 and half months ago.  Same scale  She tells me she is still bothered by left sided abdominal discomforts.  She has been bothered with this for at least 2 or 3 years now.  We have done extensive GI testing without any clear etiology.  The  pains are constant, they can get worse at times.  She has spasms in her bilateral lower abdomen as well.  ROS: complete GI ROS as described in HPI, all other review negative.  Constitutional:  No unintentional weight loss   Past Medical History:  Diagnosis Date   Bell's palsy 1966   Hx: right side facial droop, resolved per patient 04/02/19   Carotid artery disease ([place]) 2010   on vascular screening;unchanged 2013.(could not tolerate simvastatin, no other statins tried)--<30% blockage bilat 07/2011   Chronic abdominal pain    Chronic fatigue and malaise    Claustrophobia    Depression    treated in the past for years;stopped in 2010 for a years   Duodenal ulcer 1962   h/o   Dysrhythmia    ocassional PVC's, no current problems per patient on 04/02/19   Fibromyalgia    Frequent PVCs 07/2012   Seen by [facility]: benign, asymptomatic, normal EF   GERD (gastroesophageal reflux disease)    diet controlled   Glaucoma, narrow-angle    s/p laser surgery   History of hiatal hernia    during endoscopy   Hypothyroid 9/08   IBS (irritable bowel syndrome)    Dr. Benson Norway   Ischemic colitis ([place]) 11/21/2018   no current problems per patient on 11/24/27   Lichenoid keratosis 92/42/6834   Dr.Stinehelfer   Ocular migraine    Osteoporosis 10/11   Dr.Hawkes   Panic attack    Parkinson disease ([place])    Parkinson's disease ([place]) 06/23/2016   Recurrent UTI    has cystocele-Dr.Grewal   Shingles 1999   h/o   Superficial thrombophlebitis 03/2009   RLE   Trochanteric bursitis 12/2008   bilateral    Past Surgical History:  Procedure Laterality Date   ABDOMINAL HYSTERECTOMY     BACK SURGERY  05/2019   CATARACT EXTRACTION, BILATERAL  1995, 1996   EYE SURGERY Bilateral    laser - glaucoma   Flexible sigmoidoscopy     KYPHOPLASTY N/A 04/03/2019   Procedure: KYPHOPLASTY L1;  Surgeon: Melina Schools, MD;  Location: [facility];  Service: Orthopedics;  Laterality: N/A;  90 mins   THYROIDECTOMY, PARTIAL   09/2005   L nodule; Dr. Harlow Asa   TONSILLECTOMY  1946   UPPER GI ENDOSCOPY  06/27/2012   VAGINAL HYSTERECTOMY  1971   and bladder repair.  Still has ovaries   WISDOM TOOTH EXTRACTION      Current Outpatient Medications  Medication Sig Dispense Refill   acetaminophen (TYLENOL) 650 MG CR tablet Take 650 mg by mouth every 8 (eight) hours as needed for pain.     ALPRAZolam (XANAX) 0.5 MG tablet Take four tablets daily as needed for anxiety. 120 tablet 2   b complex vitamins tablet Take 1 tablet by mouth daily.     Calcium Citrate-Vitamin D (CALCIUM CITRATE +D PO) Take 1 tablet by mouth in the morning and at bedtime. Taking 400 mg of calcium and 500 units of vitamin D twice daily (total 800 mg calcium and 1000 units of vitamin D)     Carbidopa-Levodopa ER (SINEMET CR) 25-100 MG tablet controlled release Take 2 po qAm,  one po qPM and 2 po qHS 450 tablet 3   dicyclomine (BENTYL) 10 MG capsule As needed     FLUoxetine (PROZAC) 10 MG tablet Take 1 tablet (10 mg total) by mouth daily. 30 tablet 5   polyethylene glycol (MIRALAX) 17 g packet 2 doses daily 14 each 0   Probiotic Product (ALIGN PO) Take 1 capsule by mouth daily.     SYNTHROID 25 MCG tablet TAKE 1 TABLET DAILY BEFORE BREAKFAST FOR HYPOTHYROIDISM 90 tablet 0   Current Facility-Administered Medications  Medication Dose Route Frequency Provider Last Rate Last Admin   0.9 %  sodium chloride infusion  500 mL Intravenous Once Milus Banister, MD        Allergies as of 08/24/2021 - Review Complete 08/24/2021  Allergen Reaction Noted   Iodinated contrast media Anaphylaxis 08/27/2020   Iodine Anaphylaxis 05/13/2012   Levsin [hyoscyamine sulfate]  10/13/2015   Salmon [fish allergy] Hives and Shortness Of Breath 05/13/2012   Shellfish allergy Anaphylaxis 05/13/2012   Tramadol Swelling 04/11/2019   Remeron [mirtazapine] Other (See Comments) 08/25/2016   Aspirin Other (See Comments) 05/13/2012   Bis subcit-metronid-tetracyc Swelling  08/01/2012   Cephalexin Hives 05/13/2012   Ciprofloxacin Diarrhea 05/13/2012   Codeine Nausea And Vomiting 05/13/2012   Cyclobenzaprine Other (See Comments) 05/13/2012   Darvocet [propoxyphene n-acetaminophen] Nausea And Vomiting 05/13/2012   Demerol [meperidine] Nausea Only 05/13/2012   Dexlansoprazole Swelling 03/01/2016   Diphedryl [diphenhydramine] Other (See Comments) 05/13/2012   Doxycycline hyclate Other (See Comments) 05/13/2012   Doxycycline hyclate  07/30/2020   Epinephrine Other (See Comments) 05/13/2012   Erythromycin Other (See Comments) 05/13/2012   Fish oil  07/30/2020   Hyoscyamine  07/30/2020   Latex Other (See Comments) 05/13/2012   Meperidine hcl  02/20/2019   Nitrofurantoin Diarrhea 11/22/2018   Other  02/20/2019   Prednisone Other (See Comments) 05/13/2012   Ra diphedryl allergy [diphenhydramine hcl]  05/13/2012   Sertraline hcl Swelling and Other (See Comments) 05/13/2012   Shellfish-derived products  07/30/2020   Sulfa antibiotics Other (See Comments) 05/13/2012   Wellbutrin [bupropion] Other (See Comments) 07/29/2020   Xylocaine [lidocaine hcl]  05/13/2012   Xylocaine [lidocaine]  07/30/2020   Biaxin [clarithromycin] Rash 01/14/2015   Ibuprofen Other (See Comments) 05/13/2012    Family History  Problem Relation Age of Onset   Heart disease Mother    Hypertension Mother    Hypertension Sister    HIV Son    Heart disease Brother    Lung cancer Brother        lung   Diabetes Maternal Grandfather    Diabetes Granddaughter        type 1   Colon cancer Neg Hx    Esophageal cancer Neg Hx    Prostate cancer Neg Hx    Stomach cancer Neg Hx    Rectal cancer Neg Hx     Social History   Socioeconomic History   Marital status: Married    Spouse name: Gwyndolyn Saxon   Number of children: 2   Years of education: Not on file   Highest education level: Not on file  Occupational History   Occupation: retired (school system)  Tobacco Use   Smoking status:  Never   Smokeless tobacco: Never  Vaping Use   Vaping Use: Never used  Substance and Sexual Activity   Alcohol use: No    Alcohol/week: 0.0 standard drinks   Drug use: No   Sexual activity: Not Currently    Partners: Male  Birth control/protection: Other-see comments    Comment: Hysterectomy  Other Topics Concern   Not on file  Social History Narrative   Married.  Son lives in [place]; Daughter [name] lives in [place]; 2 grandchildren   Social Determinants of Health   Financial Resource Strain: Low Risk    Difficulty of Paying Living Expenses: Not hard at all  Food Insecurity: Not on file  Transportation Needs: No Transportation Needs   Lack of Transportation (Medical): No   Lack of Transportation (Non-Medical): No  Physical Activity: Not on file  Stress: Not on file  Social Connections: Not on file  Intimate Partner Violence: Not on file     Physical Exam: BP 110/72    Pulse 70    Ht 4\' 11"  (1.499 m)    Wt 96 lb (43.5 kg)    LMP  (LMP Unknown)    BMI 19.39 kg/m  Constitutional: Frail, elderly, sitting in a wheelchair Psychiatric: alert and oriented x3 Abdomen: soft, nontender, nondistended, no obvious ascites, no peritoneal signs, normal bowel sounds No peripheral edema noted in lower extremities  Assessment and plan: 88 y.o. female with chronic left-sided abdominal pains  She has had these pains for 2 or 3 years at least.  We have done extensive GI testing including numerous image tests, a colonoscopy, and upper endoscopy.  I explained to her that I do not recommend any further testing for this.  I tried to focus on getting her to move her bowels a bit more easily thinking that if we can accomplish that that some of her abdominal discomforts might improve.  To this end I recommended that she increase her MiraLAX to 2 doses once daily.  She can continue her Colace 1 or 2 pills a day.  She has told me that Bentyl has helped her in the past and she still has some at home.  I  explained she should definitely take it if it helps and I am happy to prescribe more if she needs.  She will follow-up on an as-needed basis.  Please see the "Patient Instructions" section for addition details about the plan.  Owens Loffler, MD [facility] 08/24/2021, 11:15 AM   Total time on date of encounter was 25 minutes (this included time spent preparing to see the patient reviewing records; obtaining and/or reviewing separately obtained history; performing a medically appropriate exam and/or evaluation; counseling and educating the patient and family if present; ordering medications, tests or procedures if applicable; and documenting clinical information in the health record).

## 2021-08-24 NOTE — Telephone Encounter (Signed)
Called pt at [phone]. LVM for her to call office back.

## 2021-08-25 ENCOUNTER — Ambulatory Visit: Payer: Medicare Other | Admitting: Neurology

## 2021-08-26 ENCOUNTER — Telehealth: Payer: Self-pay | Admitting: Gastroenterology

## 2021-08-26 NOTE — Telephone Encounter (Signed)
Patient called and stated that she needs a refill for Bentyl.   Would like to have it sent to:  [facility]  [address]. [place], [place]  [phone]

## 2021-08-29 MED ORDER — DICYCLOMINE HCL 10 MG PO CAPS: 10.0000 mg | ORAL_CAPSULE | Freq: Four times a day (QID) | ORAL | 0 refills | Status: DC | PRN

## 2021-08-29 NOTE — Telephone Encounter (Signed)
According to last recent office note the dicyclomine has been sent in as requested.

## 2021-08-30 ENCOUNTER — Ambulatory Visit: Payer: Medicare Other | Admitting: Psychiatry

## 2021-08-31 ENCOUNTER — Encounter: Payer: Medicare Other | Admitting: Family Medicine

## 2021-09-02 ENCOUNTER — Encounter: Payer: Self-pay | Admitting: Family Medicine

## 2021-09-02 ENCOUNTER — Telehealth (INDEPENDENT_AMBULATORY_CARE_PROVIDER_SITE_OTHER): Payer: Medicare Other | Admitting: Family Medicine

## 2021-09-02 ENCOUNTER — Other Ambulatory Visit: Payer: Self-pay

## 2021-09-02 VITALS — Temp 97.1°F | Wt 95.5 lb

## 2021-09-02 DIAGNOSIS — J0100 Acute maxillary sinusitis, unspecified: Secondary | ICD-10-CM | POA: Diagnosis not present

## 2021-09-02 MED ORDER — AZITHROMYCIN 500 MG PO TABS: 500.0000 mg | ORAL_TABLET | Freq: Every day | ORAL | 0 refills | Status: DC

## 2021-09-02 NOTE — Progress Notes (Signed)
° °  Subjective:    Patient ID: Tracy Malone, female    DOB: 1931/08/10, 88 y.o.   MRN: [id number]  HPI Documentation for virtual audio and video telecommunications through [facility] encounter: The patient was located at home. 2 patient identifiers used.  The provider was located in the office. The patient did consent to this visit and is aware of possible charges through their insurance for this visit. The other persons participating in this telemedicine service were none. Time spent on call was 5 minutes and in review of previous records >20 minutes total for counseling and coordination of care. This virtual service is not related to other E/M service within previous 7 days.  She states that approximately 1 week ago she developed a fever, headache, sinus congestion and purulent drainage occurred a couple days later as well as left maxillary tenderness.  She also complains of malaise.  She did have a fever early on but not now.  She did COVID testing which was negative.  No sore throat, earache, chest congestion or coughing. She has had this in the past and states that Z-Pak usually works. Review of Systems     Objective:   Physical Exam Alert and in no distress.  Her voice appears normal and she is not tachypneic.  She did state that palpation over the left maxillary sinus was uncomfortable.       Assessment & Plan:  Acute maxillary sinusitis, recurrence not specified - Plan: azithromycin (ZITHROMAX) 500 MG tablet She will call if further difficulties.

## 2021-09-05 ENCOUNTER — Telehealth: Payer: Self-pay | Admitting: Neurology

## 2021-09-05 ENCOUNTER — Telehealth: Payer: Self-pay | Admitting: Family Medicine

## 2021-09-05 MED ORDER — CARBIDOPA-LEVODOPA ER 25-100 MG PO TBCR: 1.0000 | EXTENDED_RELEASE_TABLET | Freq: Three times a day (TID) | ORAL | 3 refills | Status: DC

## 2021-09-05 NOTE — Telephone Encounter (Signed)
Reviewed Dr. Garth Bigness last note. He stated: "continue Sinemet CR 25/100 3 pills a day (she felt stomach issues less when she took before a meal and she will start doing that.) "  I e-scribed updated rx to [facility] per pt request.

## 2021-09-05 NOTE — Telephone Encounter (Signed)
Pt  called and states she has not started taking the zpak she thought it was suppose to be a 5 day pak, she wanted to make sure the 3 day was correct, and that this was ok for her to take,she wants to know what time of the day she needs to take it She wants also wants to know if she can take it with her sinmet cr([facility])

## 2021-09-05 NOTE — Telephone Encounter (Signed)
Pt is needing a refill request for her Carbidopa-Levodopa ER (SINEMET CR) 25-100 MG tablet controlled release sent to the [facility] on [address].

## 2021-09-07 ENCOUNTER — Ambulatory Visit (INDEPENDENT_AMBULATORY_CARE_PROVIDER_SITE_OTHER): Payer: Medicare Other | Admitting: Psychiatry

## 2021-09-07 DIAGNOSIS — F411 Generalized anxiety disorder: Secondary | ICD-10-CM

## 2021-09-07 NOTE — Progress Notes (Signed)
[facility] Counselor/Therapist Progress Note  Patient ID: Tracy Malone, MRN: [id number],    Date: 09/07/2021  Time Spent: 48 minutes   Virtual Visit via Telehealth Note: Telephone only, pt unable to do the video Connected with patient by a telemedicine/telehealth application, with their informed consent, and verified patient privacy and that I am speaking with the correct person using two identifiers. I discussed the limitations, risks, security and privacy concerns of performing psychotherapy and the availability of in person appointments. I also discussed with the patient that there may be a patient responsible charge related to this service. The patient expressed understanding and agreed to proceed. I discussed the treatment planning with the patient. The patient was provided an opportunity to ask questions and all were answered. The patient agreed with the plan and demonstrated an understanding of the instructions. The patient was advised to call  our office if  symptoms worsen or feel they are in a crisis state and need immediate contact.   Therapist Location: [facility] Patient Location: home   Treatment Type: Individual Therapy  Reported Symptoms: anxious, depressed, "worried"  Mental Status Exam:  Appearance:   N/A   telehealth      Behavior:  Appropriate, Sharing, and Motivated  Motor:  Uses cane or rollater  Speech/Language:   Clear and Coherent  Affect:  N/A   telehealth  Mood:  anxious and depressed  Thought process:  normal  Thought content:    Obsessiveness and overthinking  Sensory/Perceptual disturbances:    WNL  Orientation:  oriented to person, place, time/date, situation, day of week, month of year, year, and stated date of Feb. 15, 2023  Attention:  Good  Concentration:  Fair  Memory:  WNL  Fund of knowledge:   Good  Insight:    Fair  Judgment:   Good and Fair  Impulse Control:  Good   Risk Assessment: Danger to Self:   No Self-injurious Behavior: No Danger to Others: No Duty to Warn:no Physical Aggression / Violence:No  Access to Firearms a concern: No  Gang Involvement:No   Subjective: Patient, age 87 and very alert, today reporting frustration, anxiety, some tearfulness, and some depression. Symptoms mostly related to her husband's current illness and recent hospital discharge and still recuperating at home gradually. Needed session today to talk through her concerns fears, and anxieties, especially her fears concerning her husband's illness and hopes he's really getting better. (They've been married 81 yrs.) Focuses on her own self-care including staying in touch with family, taking her meds as prescribed, doing activities that are enjoyable to her, focus on the positives daily, not dwelling on the negatives, trying to allow for more sleep, accept the offers from church friends and others to help her and her husband, follow up with her medical doctor's recommendations, and move about carefully as she is able and per advice from her Dr. Did well in talking through some of her fears ad worries today, and after some time, was able to stay more in the moment with some "positives" versus repeatedly going back to the excessive worries.  Continue to encourage her to be up and around moving some each day, per the support of her doctor, not isolating, and staying in touch with other people.  Family remains supportive and she mentioned today that she and husband are beginning to consider possible retirement communities "due to our age and some increased health difficulties."  Interventions: Cognitive Behavioral Therapy, Ego-Supportive, and Insight-Oriented  Treatment Goal  Plan:  Patient not signing treatment plan on computer screen due to [place]. Treatment Goals: Treatment goals will remain on treatment plan as patient works with strategies to achieve her goals.  Progress will be noted each session and documented in the  "progress" section of note. Long term goal: Develop the ability to recognize, accept, and cope with feelings of anxiety and depression. Short term goal: Verbalize any unresolved grief issues that may be contributing to anxiety and depression. Strategy: Replace negative self-defeating self talk with verbalization of realistic and positive cognitive messages to lessen depression/anxiety and improve mood   Diagnosis:   ICD-10-CM   1. Generalized anxiety disorder  F41.1      Plan:  Patient today showing good motivation and participation in working on coping as well as possible with some decline in her husband's health and also her own.  They have been married 28 years and do quite well but recently has had increased in medical issues.  She did well in talking through her fears/anxieties/and worries in session today as noted above.  Denies any SI.  Also shared that they are talking a little more about possibly considering a move to one of the nearby retirement community's.  They plan to talk further with family and visit at least a couple of them that may be of interest for them to consider.  Continue to encourage patient not to assume "worst case scenarios" as that heightens her anxiety and depression.  Husband has recently been discharged from Hospital due to pulmonary issues and patient has had some sinus infection and started medications and was grateful to have the appointment by telephone today.  Reminded her to follow up with her med provider here at our office as she had to cancel that appointment due to illness yesterday. Encouraged patient in her practice of more positive behaviors including: Reflecting on her progress and feeling good about it, believing in herself and her ability to make significant changes especially with some long term behaviors that are not healthy for her, remain on her medication as prescribed, consider some other activities to be involved and that are of interest to her  including reading books and listening to her favorite music and going to the [employer] close to her home, move about carefully to get some exercise as she is able per her doctor, allow for good sleep patterns including not napping a lot during the day, healthy nutrition, consistent positive self talk, staying in the present focusing on what she can control or change, remaining in touch with family and friends that are supportive and healthy for her, continue to watch the online services from her church which she enjoys, continuing the difficult work of letting go of negativity and past hurts/regrets/resentments in order to move forward, for every negative thought create 2 positives, and recognize the strengths she shows working with goal-directed behaviors to move forward in a direction that supports her improved emotional health.  Goal review and progress/challenges noted with patient.  Next appointment within 2 weeks.  This record has been created using [employer].  Chart creation errors have been sought, but may not always have been located and corrected.  Such creation errors do not reflect on the standard of medical care provided.  Shanon Ace, LCSW

## 2021-09-12 ENCOUNTER — Telehealth: Payer: Self-pay | Admitting: Family Medicine

## 2021-09-12 ENCOUNTER — Other Ambulatory Visit: Payer: Medicare Other

## 2021-09-12 ENCOUNTER — Telehealth: Payer: Self-pay

## 2021-09-12 ENCOUNTER — Other Ambulatory Visit: Payer: Self-pay

## 2021-09-12 DIAGNOSIS — Z515 Encounter for palliative care: Secondary | ICD-10-CM

## 2021-09-12 NOTE — Telephone Encounter (Signed)
Patient advised to schedule appt here. She will call back.

## 2021-09-12 NOTE — Telephone Encounter (Signed)
Pt called and wants to know if she needs a referral  to go to the [facility] states she is still having some leg nerve pain down her left leg, and it stays swollenthe other ortho dr she goes to only wants to give her shots and she does not want to keep getting them, so she wants to go somewhere else  Pt can be reached at  [phone]

## 2021-09-12 NOTE — Telephone Encounter (Signed)
Phone call placed to patient to check in and offer to schedule visit. Phone rang with no answer and no VM

## 2021-09-13 ENCOUNTER — Encounter: Payer: Self-pay | Admitting: Neurology

## 2021-09-13 ENCOUNTER — Ambulatory Visit (INDEPENDENT_AMBULATORY_CARE_PROVIDER_SITE_OTHER): Payer: Medicare Other | Admitting: Psychiatry

## 2021-09-13 DIAGNOSIS — F411 Generalized anxiety disorder: Secondary | ICD-10-CM | POA: Diagnosis not present

## 2021-09-13 NOTE — Progress Notes (Signed)
[facility] Counselor/Therapist Progress Note  Patient ID: Tracy Malone, MRN: [id number],    Date: 09/13/2021  Time Spent: 50 minutes   Virtual Visit via Telehealth Note: Telephone Only Connected with patient by a telemedicine/telehealth application, with their informed consent, and verified patient privacy and that I am speaking with the correct person using two identifiers. I discussed the limitations, risks, security and privacy concerns of performing psychotherapy and the availability of in person appointments. I also discussed with the patient that there may be a patient responsible charge related to this service. The patient expressed understanding and agreed to proceed. I discussed the treatment planning with the patient. The patient was provided an opportunity to ask questions and all were answered. The patient agreed with the plan and demonstrated an understanding of the instructions. The patient was advised to call  our office if  symptoms worsen or feel they are in a crisis state and need immediate contact.   Therapist Location: home office  Patient Location: home  Treatment Type: Individual Therapy  Reported Symptoms: anxiety (reported as main symptom), depression  Mental Status Exam:  Appearance:   N/a   telehealth      Behavior:  Appropriate, Sharing, and lower motivation per patient  Motor:  Uses cane or rollater to walk  Speech/Language:   Clear and Coherent  Affect:  N/a  telehealth  Mood:  anxious and depressed  Thought process:  goal directed  Thought content:    Overthinking and ruminating  Sensory/Perceptual disturbances:    WNL  Orientation:  oriented to person, place, time/date, situation, day of week, month of year, year, and stated date of Feb. 21, 2023  Attention:  Good  Concentration:  Fair  Memory:  [place] of knowledge:   Good  Insight:    Fair  Judgment:   Good and Fair  Impulse Control:  Good   Risk Assessment: Danger to Self:   No Self-injurious Behavior: No Danger to Others: No Duty to Warn:no Physical Aggression / Violence:No  Access to Firearms a concern: No  Gang Involvement:No   Subjective:  Patient today reports depression, anxiety (which she reports is her strongest symptom). Some occasional tearfulness. Feels her stressors are her own health issues, and her husband's health. Patient and husband has been married 53 yrs. Today talked through some of her lack of motivation, concerns about her husband's increased health concern, fears,and anxieties.  Focused on her Parkinsons, and also her husband's health especially and felt "good to be heard".  Does tend to "worry about down the road and assume worst case scenarios" and encouraged her today in working to stay more in the present, following all of her doctors' suggestions, and staying in touch with supportive people and family, remain on meds as prescribed, and focusing more on some of the positives in her life which she was able to name.   States she is working on her gratitude list and that helps. Is getting some outside help from palliative care that "stops by every about 3 weeks and checks on [place] to see if we need anything."  Daughter also lives nearby and checks on patient and husband frequently. Mentioned again that she and husband are considering nearby retirement communities but haven't made any decisions yet.   Interventions: Solution-Oriented/Positive Psychology and Insight-Oriented  Treatment Goal Plan:  Patient not signing treatment plan on computer screen due to [place]. Treatment Goals: Treatment goals will remain on treatment plan as patient works with  strategies to achieve her goals.  Progress will be noted each session and documented in the "progress" section of note. Long term goal: Develop the ability to recognize, accept, and cope with feelings of anxiety and depression. Short term goal: Verbalize any unresolved grief issues that may be contributing  to anxiety and depression. Strategy: Replace negative self-defeating self talk with verbalization of realistic and positive cognitive messages to lessen depression/anxiety and improve mood.  Diagnosis:   ICD-10-CM   1. Generalized anxiety disorder  F41.1      Plan:   Patient today reporting some lowered motivation but did actively participate in session working on her depression and anxiety. Did well in sharing and talking through some of her specific fears/concerns related to her and her husband's heightened health issues.  Was more calm and grounded by session end and seemed to be less negative in her outlook, adding that "I do need to stop worrying so much" and we discussed her interruption of her "worrying thoughts" and paying attention to how they feed her anxiety and depression. Was able to reflect some of her  progress and feeling good about it, believing in herself and her ability to make significant changes, even when there are issues that are difficult for her to confront. Believing in herself more at times and shared that at other times, it's difficult but still working with this in and out of sessions. Encouraged patient to follow through on our work in session today and stay on her meds as prescribed, consider doing more of the activities she says she enjoys including reading and listening to music, getting books from nearby [employer], moving about carefully for exercise, allow for good sleep patterns and not sleeping too much during the day, staying in the present focusing on what she can control or change versus cannot, and remain in contact with family and friends who are supportive.   Goal review and progress/challenges noted with patient.   Next appt within 2 weeks.  This record has been created using [employer].  Chart creation errors have been sought, but may not always have been located and corrected.  Such creation errors do not reflect on the standard of medical care  provided.   Shanon Ace, LCSW

## 2021-09-13 NOTE — Progress Notes (Signed)
PATIENT NAME: FARRIE SANN DOB: 06/17/1932 MRN: [id number]  PRIMARY CARE PROVIDER: [name], MD  RESPONSIBLE PARTY:  Acct ID - Guarantor Home Phone Work Phone Relationship Acct Type  [id number] KIRSI, [phone] Self P/F     [address], [place], [place] [address]   [facility] PALLIATIVE CARE RN NOTE     PLAN OF CARE and INTERVENTION:  ADVANCE CARE PLANNING/GOALS OF CARE: comfort, would like to stay in her home, avoid hospitalizations. CAREGIVER EDUCATION: Safety, Symptom Management. DISEASE STATUS: RN Palliative care telephonic encounter completed with patient today. Patient verbally engaging. Denies any pain or shortness of breath. Patient expresses feeling depressed at times and notes that some of the medications she is on may be contributing to this. She has a Social worker who she finds helpful. Plan is to schedule a visit with Palliative NP for follow-up. Patient knows to call with any questions or concerns. HISTORY OF PRESENT ILLNESS: This is an 88- year-old female with a diagnosis of Parkinson Disease. Palliative care has been asked to follow for additional support, care and complex decision making.   CODE STATUS: Full Code PPS: 60% Physical Exam: deferred.       Duration: 40 min     Cornelius Moras, RN

## 2021-09-14 DIAGNOSIS — H52203 Unspecified astigmatism, bilateral: Secondary | ICD-10-CM | POA: Diagnosis not present

## 2021-09-14 DIAGNOSIS — H0100A Unspecified blepharitis right eye, upper and lower eyelids: Secondary | ICD-10-CM | POA: Diagnosis not present

## 2021-09-14 DIAGNOSIS — H43813 Vitreous degeneration, bilateral: Secondary | ICD-10-CM | POA: Diagnosis not present

## 2021-09-14 DIAGNOSIS — Z961 Presence of intraocular lens: Secondary | ICD-10-CM | POA: Diagnosis not present

## 2021-09-20 ENCOUNTER — Other Ambulatory Visit: Payer: Self-pay | Admitting: Adult Health

## 2021-09-20 DIAGNOSIS — F411 Generalized anxiety disorder: Secondary | ICD-10-CM

## 2021-09-22 ENCOUNTER — Encounter: Payer: Self-pay | Admitting: Gastroenterology

## 2021-09-23 ENCOUNTER — Other Ambulatory Visit (INDEPENDENT_AMBULATORY_CARE_PROVIDER_SITE_OTHER): Payer: Medicare Other

## 2021-09-23 ENCOUNTER — Other Ambulatory Visit: Payer: Self-pay

## 2021-09-23 DIAGNOSIS — R197 Diarrhea, unspecified: Secondary | ICD-10-CM

## 2021-09-23 LAB — BASIC METABOLIC PANEL
BUN: 12 mg/dL (ref 6–23)
CO2: 29 mEq/L (ref 19–32)
Calcium: 9.4 mg/dL (ref 8.4–10.5)
Chloride: 104 mEq/L (ref 96–112)
Creatinine, Ser: 0.63 mg/dL (ref 0.40–1.20)
GFR: 79.36 mL/min (ref >60.00)
Glucose, Bld: 147 mg/dL — ABNORMAL HIGH (ref 70–99)
Potassium: 3.5 mEq/L (ref 3.5–5.1)
Sodium: 141 mEq/L (ref 135–145)

## 2021-09-23 LAB — CBC WITH DIFFERENTIAL/PLATELET
Basophils Absolute: 0.1 10*3/uL (ref 0.0–0.1)
Basophils Relative: 0.9 % (ref 0.0–3.0)
Eosinophils Absolute: 0.1 10*3/uL (ref 0.0–0.7)
Eosinophils Relative: 1.8 % (ref 0.0–5.0)
HCT: 38.5 % (ref 36.0–46.0)
Hemoglobin: 12.8 g/dL (ref 12.0–15.0)
Lymphocytes Relative: 29.5 % (ref 12.0–46.0)
Lymphs Abs: 1.8 10*3/uL (ref 0.7–4.0)
MCHC: 33.3 g/dL (ref 30.0–36.0)
MCV: 91.5 fl (ref 78.0–100.0)
Monocytes Absolute: 0.5 10*3/uL (ref 0.1–1.0)
Monocytes Relative: 8.4 % (ref 3.0–12.0)
Neutro Abs: 3.6 10*3/uL (ref 1.4–7.7)
Neutrophils Relative %: 59.4 % (ref 43.0–77.0)
Platelets: 261 10*3/uL (ref 150.0–400.0)
RBC: 4.21 Mil/uL (ref 3.87–5.11)
RDW: 13.8 % (ref 11.5–15.5)
WBC: 6.1 10*3/uL (ref 4.0–10.5)

## 2021-09-23 NOTE — Telephone Encounter (Signed)
Dr Ardis Hughs would you like to order stool studies? See 2nd message as well  ?

## 2021-09-26 ENCOUNTER — Other Ambulatory Visit: Payer: Medicare Other

## 2021-09-26 DIAGNOSIS — R197 Diarrhea, unspecified: Secondary | ICD-10-CM | POA: Diagnosis not present

## 2021-09-26 DIAGNOSIS — O0480 (Induced) termination of pregnancy with unspecified complications: Secondary | ICD-10-CM | POA: Diagnosis not present

## 2021-09-26 DIAGNOSIS — O0930 Supervision of pregnancy with insufficient antenatal care, unspecified trimester: Secondary | ICD-10-CM | POA: Diagnosis not present

## 2021-09-27 ENCOUNTER — Encounter: Payer: Self-pay | Admitting: Gastroenterology

## 2021-09-27 ENCOUNTER — Telehealth: Payer: Self-pay | Admitting: Adult Health

## 2021-09-27 ENCOUNTER — Ambulatory Visit (INDEPENDENT_AMBULATORY_CARE_PROVIDER_SITE_OTHER): Payer: Medicare Other | Admitting: Psychiatry

## 2021-09-27 DIAGNOSIS — F411 Generalized anxiety disorder: Secondary | ICD-10-CM | POA: Diagnosis not present

## 2021-09-27 LAB — GI PROFILE, STOOL, PCR

## 2021-09-27 NOTE — Progress Notes (Signed)
?    [facility] Counselor/Therapist Progress Note ? ?Patient ID: Tracy Malone, MRN: [id number],   ? ?Date: 09/27/2021 ? ?Time Spent: 50 minutes  ? ?Virtual Visit via Telehealth Note:  Telephone only, Pt not able to do MyChart Video session ?Connected with patient by a telemedicine/telehealth application, with their informed consent, and verified patient privacy and that I am speaking with the correct person using two identifiers. I discussed the limitations, risks, security and privacy concerns of performing psychotherapy and the availability of in person appointments. I also discussed with the patient that there may be a patient responsible charge related to this service. The patient expressed understanding and agreed to proceed. ?I discussed the treatment planning with the patient. The patient was provided an opportunity to ask questions and all were answered. The patient agreed with the plan and demonstrated an understanding of the instructions. The patient was advised to call  our office if  symptoms worsen or feel they are in a crisis state and need immediate contact. ?  ?Therapist Location: office ?Patient Location: home ?  ?Treatment Type: Individual Therapy ? ?Reported Symptoms:  anxiety ? ?Mental Status Exam: ? ?Appearance:   N/a   telehealth      ?Behavior:  Appropriate, Sharing, and Motivated  ?Motor:  Walks with rollater,  sometimes cane  ?Speech/Language:   Clear and Coherent  ?Affect:  N/a   telehealth  ?Mood:  anxious and depressed  ?Thought process:  Some tangentiality  ?Thought content:    Rumination  ?Sensory/Perceptual disturbances:    WNL  ?Orientation:  oriented to person, place, time/date, situation, day of week, month of year, year, and stated date of September 27, 2021  ?Attention:  Good  ?Concentration:  Good  ?Memory:  WNL  ?Fund of knowledge:   Good  ?Insight:    Good and Fair  ?Judgment:   Good and Fair  ?Impulse Control:  Good and Fair  ? ?Risk Assessment: ?Danger to Self:   No ?Self-injurious Behavior: No ?Danger to Others: No ?Duty to Warn:no ?Physical Aggression / Violence:No  ?Access to Firearms a concern: No  ?Gang Involvement:No  ? ?Subjective:  Patient today reports anxiety, depression, with anxiety being her strongest symptom. Struggling with adult children trying to limit some things like patient's driving as they feel it's unsafe.  Patient is 18. "They're trying to make me an invalid." Hard for patient to not take it in a way that is "hurtful or limiting", "they're treating me like a child".  Denies any SI.  Talked through these concerns today and she eventually felt more calm and grounded. Husband taking Wellbutrin and patient feeling it's hurting not helping  and having what patient feels are side effects and plans to go with him to next Dr appt.  ?Patient feeling more down today and needed to talk through her concerns which she did at length but did seem to benefit from that as she was a more upbeat and trying to think of how she could spend her time at home participating in some things rather than dwelling on negatives.  Encouraged to interrupt her long-term habit of worrying as she is able and try to refocus on not assuming everything is going in a negative direction, and be open to seeing some of the positives around her and she can do this with a little bit of helpful suggestions as we did today. ? ?Interventions: Solution-Oriented/Positive Psychology and Ego-Supportive ?  ?Treatment Goal Plan:  ?Patient not signing treatment plan on  computer screen due to [place]. ?Treatment Goals: ?Treatment goals will remain on treatment plan as patient works with strategies to achieve her goals.  Progress will be noted each session and documented in the "progress" section of note. ?Long term goal: ?Develop the ability to recognize, accept, and cope with feelings of anxiety and depression. ?Short term goal: ?Verbalize any unresolved grief issues that may be contributing to anxiety and  depression. ?Strategy: ?Replace negative self-defeating self talk with verbalization of realistic and positive cognitive messages to lessen depression/anxiety and improve mood. ? ?Diagnosis: ?  ICD-10-CM   ?1. Generalized anxiety disorder  F41.1   ?  ? ? ?Plan:  Patient today reporting anxiety and depression as she and her husband continue to experience multiple health issues as well as some anxiety and depression.  Her husband's health has been some worse lately and patient has been more stressed over that, with it being hard for her not to assume worst case scenarios.  Worked as patient was able on her trying to refrain from assuming the worst, and instead hope for the best and trust that they will continue to manage their lives as well as possible and that they have help as needed.  Patient will at times admit "I know I need to stop worrying and I have done this a long time but it is just hard".  They are continuing to consider a move into a retirement community but has not made a final decision on any of that yet. Encouraged patient in practicing more positive behaviors and good self-care including: Remaining on her medications as prescribed, not overly focusing on negatives nor assuming worst case scenarios, listening to music which she finds soothing, consider doing more activities that she enjoys such as reading and talking with friends on the phone, getting books from her nearby [employer], moving about carefully for exercise, allow for good sleep patterns and not sleeping too much during the day, staying in the present and focusing on what she can control or change, remain in contact with family and friends who are supportive, and feel good about the strength she shows working with goal-directed behaviors to move in a direction that supports her improved emotional health. ? ?Goal review and progress/challenges noted with patient. ? ?Next appointment within 2 to 3 weeks ? ?This record has been created  using [employer].  Chart creation errors have been sought, but may not always have been located and corrected.  Such creation errors do not reflect on the standard of medical care provided. ? ? ?Shanon Ace, LCSW ? ? ? ? ? ? ? ? ? ? ? ? ? ? ? ? ? ? ?

## 2021-09-27 NOTE — Telephone Encounter (Signed)
Pt was crying when she spoke.She wants to know if it should be increased because she does not see a difference and is crying all the time.

## 2021-09-27 NOTE — Telephone Encounter (Addendum)
Pt LVM around 12:00 noon reporting she is due for refill on Prozac, but not sure it's working. Asking if dose needs to be increased to 20 mg. Currently taking 10 mg. Contact # [phone] Send to [facility], they deliver. ?

## 2021-09-27 NOTE — Telephone Encounter (Signed)
Pt informed

## 2021-09-27 NOTE — Telephone Encounter (Signed)
We can try the increase - but she has struggled with GI issues at the '10mg'$ .

## 2021-10-05 NOTE — Progress Notes (Signed)
Chief Complaint  ?Patient presents with  ? Sinus Problem  ?  Had sinus infection about a month ago-took prescribed abx (gave her diarrhea). Green mucus is gone. Still has soreness inside of her nose on left side and some facial swelling on left side. Also wanted to see if she could go to PT/sports medicaine for her L side lower back has some pain. Does not want anymore shots. [facility].   ? ? ?09/02/21 she saw Dr. Redmond School with complaint of sinus infection.  She was treated with azithromycin $RemoveBeforeDEI'500mg'[id number]$  x 3 days. At that time she had left maxillary sinus pain and purulent drainage. ? ?She then started having diarrhea and reached out to Dr. Ardis Hughs.  She had negative stool studies, and normal b-met and CBC. ?Her bowels are better. She is taking fiber gummies at night. ?Having daily bowel movements, "like clockwork", not loose or hard. She still has some pain in the upper stomach, and L sided discomfort, but not severe. ? ?When she wakes up in the morning, she notes swelling on the L cheek, and soreness on the inside of the nose on the left.  This gradually gets better during the day.  She sleeps on her right side (can't sleep on the left due to back pain).  Mucus is now clear.  No longer doing sinus rinses. ?She uses saline spray just at night. ?She states she saw ENT twice, and they didn't find anything. She also saw the dentist and the eye doctor. ? ?Review of chart--she saw ENT back in MAY 2022--she didn't realize it had been that long ago.  She states it had gotten better, somewhat, never completely better. ? ?Pietro Cassis 11/2020: ?I have suggested that she use the saline that she mixes up for the Nettie pot and just pour that into a spray bottle and change it out every day or so. She could possibly be sensitive to a preservative or inert ingredient in the commercial saline spray. ?Trial of Flonase nasal spray 2 sprays per nostril daily ?Mupirocin ointment twice daily to 3 times daily as needed for  irritation ?Continue working with dentist regarding jaw and TMJ ?Return here as needed ?She has not been using Flonase.  She doesn't recall the mupirocin. ? ?Hypothyroidism--still skipping her dose on Sundays. Wondering if she should still be doing this.  ?She is very tired, hair is falling out, skin is dry. ? ?Lab Results  ?Component Value Date  ? TSH 1.080 06/20/2021  ? ?She continues to have pain in her L lower back.  She talked to the PT that has been coming to see her husband.  She thinks it is a muscle that swells in her back.  He suggested that she see sports medicine doctors at [facility].  She has gotten injections from Dr. Nelva Bush which haven't helped. They just "deadened my legs, I couldn't feel my feet", but didn't help the pain. ?She is requesting a referral.  ?Pain is on left lower back; she cannot lay on that side. ?Uses Tylenol at night so she can sleep better, and once around lunch.  Takes it twice daily. ? ? ?PMH, PSH, SH reviewed ? ?Outpatient Encounter Medications as of 10/06/2021  ?Medication Sig Note  ? acetaminophen (TYLENOL) 650 MG CR tablet Take 650 mg by mouth every 8 (eight) hours as needed for pain. 10/06/2021: Took at 10:15 today  ? b complex vitamins tablet Take 1 tablet by mouth daily. 06/09/2021: Will take this afternoon  ? Calcium  Citrate-Vitamin D (CALCIUM CITRATE +D PO) Take 1 tablet by mouth in the morning and at bedtime. Taking 400 mg of calcium and 500 units of vitamin D twice daily (total 800 mg calcium and 1000 units of vitamin D)   ? Carbidopa-Levodopa ER (SINEMET CR) 25-100 MG tablet controlled release Take 1 tablet by mouth in the morning, at noon, and at bedtime.   ? Probiotic Product (ALIGN PO) Take 1 capsule by mouth daily. 06/09/2021: Will take this afternoon  ? SYNTHROID 25 MCG tablet TAKE 1 TABLET DAILY BEFORE BREAKFAST FOR HYPOTHYROIDISM 10/06/2021: She takes 1 daily Monday through Saturday, skipping Sunday's dose.  ? [DISCONTINUED] polyethylene glycol (MIRALAX) 17 g  packet 2 doses daily   ? ALPRAZolam (XANAX) 0.5 MG tablet Take four tablets daily as needed for anxiety. (Patient not taking: Reported on 10/06/2021) 10/06/2021: As needed  ? dicyclomine (BENTYL) 10 MG capsule Take 1 capsule (10 mg total) by mouth every 6 (six) hours as needed. (Patient not taking: Reported on 10/06/2021) 10/06/2021: As needed  ? [DISCONTINUED] azithromycin (ZITHROMAX) 500 MG tablet Take 1 tablet (500 mg total) by mouth daily.   ? [DISCONTINUED] FLUoxetine (PROZAC) 10 MG tablet Take 1 tablet (10 mg total) by mouth daily.   ? ?Facility-Administered Encounter Medications as of 10/06/2021  ?Medication  ? 0.9 %  sodium chloride infusion  ? ?Allergies  ?Allergen Reactions  ? Iodinated Contrast Media Anaphylaxis  ? Iodine Anaphylaxis  ?  IV and topical forms. ?Other reaction(s): Unknown  ? Levsin [Hyoscyamine Sulfate]   ?  Vision problems/pt has glaucoma  ? Salmon [Fish Allergy] Hives and Shortness Of Breath  ? Shellfish Allergy Anaphylaxis  ? Tramadol Swelling  ? Remeron [Mirtazapine] Other (See Comments)  ?  Cause blurred vision and red eyes, pt has glaucoma  ? Aspirin Other (See Comments)  ?  Sever stomach pain due to ulcer scaring.  ? Bis Subcit-Metronid-Tetracyc Swelling  ?  Tongue swelling. Face tingling ?Other reaction(s): Unknown  ? Cephalexin Hives  ?  Other reaction(s): hives  ? Ciprofloxacin Diarrhea  ? Codeine Nausea And Vomiting  ? Cyclobenzaprine Other (See Comments)  ?  Tingly/prickly sensation. ?Other reaction(s): tingly/prickly sensation  ? Darvocet [Propoxyphene N-Acetaminophen] Nausea And Vomiting  ? Demerol [Meperidine] Nausea Only  ? Dexlansoprazole Swelling  ?  Redness, swelling and peeling of both feet. ?Other reaction(s): foot pain  ? Diphedryl [Diphenhydramine] Other (See Comments)  ?  Increased pulse/small amount ok  ? Doxycycline Hyclate Other (See Comments)  ?  GI intolerance.  ? Doxycycline Hyclate   ?  Other reaction(s): GI intolerance  ? Epinephrine Other (See Comments)  ?   Breathing problems ?Other reaction(s): breathing problems/fainting  ? Erythromycin Other (See Comments)  ?  GI intolerance. ?Other reaction(s): GI  ? Fish Oil   ?  Other reaction(s): breathing problems/hives  ? Hyoscyamine   ?  Other reaction(s): eye pain  ? Latex Other (See Comments)  ?  Gloves ok.  Skin gets red from elastic in underwear and latex bandaides.  ? Meperidine Hcl   ?  Other reaction(s): vomiting  ? Nitrofurantoin Diarrhea  ? Other   ?  Other reaction(s): migraines ?Other reaction(s): Unknown ?Other reaction(s): Unknown ?Other reaction(s): Unknown ?Other reaction(s): increased pulse, faint, diarrhea  ? Prednisone Other (See Comments)  ?  Headache ?Other reaction(s): headache  ? Ra Diphedryl Allergy [Diphenhydramine Hcl]   ?  Other reaction(s): increased pulse small dose okay  ? Sertraline Hcl Swelling and Other (See Comments)  ?  Migraine ?Swelling of tongue/lip (09/2012) ?Other reaction(s): Unknown  ? Shellfish-Derived Products   ?  Other reaction(s): Unknown  ? Sulfa Antibiotics Other (See Comments)  ?  Increased pulse, fainting, diarrhea, thrush  ? Wellbutrin [Bupropion] Other (See Comments)  ?  Headaches  ? Xylocaine [Lidocaine Hcl]   ?  With epinephrine, given by dentist.  Speeded up heart rate and she passed out (occured twice, at dentist)  ? Xylocaine [Lidocaine]   ?  Other reaction(s): Unknown  ? Biaxin [Clarithromycin] Rash  ?  Started after completing 10 day course of 2000 mg /day, Lips swelling  ? Ibuprofen Other (See Comments)  ?  Motrin ok with a GI effect. ?Other reaction(s): rash Motrin okay with a GI effect  ? ?ROS: No fever, chills.  L sided nasal pain and cheek swelling in mornings, per HPI.  No purulent drainage, sore throat or cough. No chest pain or shortness of breath.  +L sided LBP. No urinary complaints. Bowels are regular. ?Tired, not sleeping well at night, only 4 hours. ?See HPI ? ? ?PHYSICAL EXAM: ? ?BP 140/78   Pulse 76   Temp (!) 97 ?F (36.1 ?C) (Tympanic)   Ht 4' 11"   (1.499 m)   Wt 95 lb (43.1 kg)   LMP  (LMP Unknown)   BMI 19.19 kg/m?  ? ?Wt Readings from Last 3 Encounters:  ?10/06/21 95 lb (43.1 kg)  ?09/02/21 95 lb 8 oz (43.3 kg)  ?08/24/21 96 lb (43.5 kg)  ? ?Thin, frail female in n

## 2021-10-06 ENCOUNTER — Other Ambulatory Visit: Payer: Self-pay

## 2021-10-06 ENCOUNTER — Ambulatory Visit (INDEPENDENT_AMBULATORY_CARE_PROVIDER_SITE_OTHER): Payer: Medicare Other | Admitting: Family Medicine

## 2021-10-06 ENCOUNTER — Encounter: Payer: Self-pay | Admitting: Family Medicine

## 2021-10-06 VITALS — BP 140/78 | HR 76 | Temp 97.0°F | Ht 59.0 in | Wt 95.0 lb

## 2021-10-06 DIAGNOSIS — J309 Allergic rhinitis, unspecified: Secondary | ICD-10-CM

## 2021-10-06 DIAGNOSIS — J3489 Other specified disorders of nose and nasal sinuses: Secondary | ICD-10-CM | POA: Diagnosis not present

## 2021-10-06 DIAGNOSIS — E039 Hypothyroidism, unspecified: Secondary | ICD-10-CM | POA: Diagnosis not present

## 2021-10-06 DIAGNOSIS — G8929 Other chronic pain: Secondary | ICD-10-CM | POA: Diagnosis not present

## 2021-10-06 DIAGNOSIS — M545 Low back pain, unspecified: Secondary | ICD-10-CM | POA: Diagnosis not present

## 2021-10-06 NOTE — Patient Instructions (Addendum)
?  Your nose appears to have swelling of the mucus membranes, consistent with allergies.  I do not see any sores. ?Use Flonase daily--2 gentle sniffs once daily. ?You should also use some saline spray in your nose (your homemade version as previously discussed with the ENT, since the store-bought ones caused you more discomfort/burning). Use it in the morning, and regularly throughout the day. Definitely use it when you wake up in the morning and it feels sore. ?There is no evidence of any sinus infection currently. ?If you have more pain and discolored mucus develops, return for re-evaluation. ? ?We are checking your thyroid again, to make sure we don't need to make any further dose changes. ? ?We are referring you to [facility], as requested.  I hope they are able to help you with your pain. ? ? ? ?

## 2021-10-07 LAB — TSH: TSH: 2.18 u[IU]/mL (ref 0.450–4.500)

## 2021-10-11 ENCOUNTER — Other Ambulatory Visit: Payer: Self-pay

## 2021-10-11 ENCOUNTER — Ambulatory Visit (INDEPENDENT_AMBULATORY_CARE_PROVIDER_SITE_OTHER): Payer: Medicare Other | Admitting: Psychiatry

## 2021-10-11 DIAGNOSIS — F411 Generalized anxiety disorder: Secondary | ICD-10-CM | POA: Diagnosis not present

## 2021-10-11 NOTE — Progress Notes (Signed)
?    [facility] Counselor/Therapist Progress Note ? ?Patient ID: Tracy Malone, MRN: [id number],   ? ?Date: 10/11/2021 ? ?Time Spent: 50 miinutes  ? ?Treatment Type: Individual Therapy ? ?Reported Symptoms: anxiety, depression. "Anxiety is worst symptom." ? ?Mental Status Exam: ? ?Appearance:   Neat     ?Behavior:  Appropriate, Sharing, and Motivated  ?Motor:  Some slowed but able to walk today without rollater  ?Speech/Language:   Clear and Coherent  ?Affect:  Anxious, some depression  ?Mood:  anxious and depressed  ?Thought process:  goal directed  ?Thought content:    overthinking  ?Sensory/Perceptual disturbances:    WNL  ?Orientation:  oriented to person, place, time/date, situation, day of week, month of year, year, and stated date of October 11, 2021  ?Attention:  Good  ?Concentration:  Good and Fair  ?Memory:  WNL  ?Fund of knowledge:   Good  ?Insight:    Fair  ?Judgment:   Good and Fair  ?Impulse Control:  Good and Fair  ? ?Risk Assessment: ?Danger to Self:  No ?Self-injurious Behavior: No ?Danger to Others: No ?Duty to Warn:no ?Physical Aggression / Violence:No  ?Access to Firearms a concern: No  ?Gang Involvement:No  ? ?Subjective:  Patient today reports anxiety, depression, with anxiety reported as her "worst symptom." States her anxiety and depression mostly related to her health issues and the heath issues of her husband and sister (living out of state). Vented freely her concerns, "fears and anxieties". Very difficult for patient to look for the positives, and she admits this openly today. Working on self-acceptance, hoping for the best and not automatically assuming the worst. Worked on this more today with patient and she did respond well "but it's hard to hold onto the positives".  She is to see a different doctor within the next couple of weeks for her back issues as she "did not want to keep getting shots."  Encouraged her to consider narrowing her focus to a couple positives and work to hold  onto them first, and she was able to select two (her long-time marriage, and supportive friends).  Encouraged her to interrupt herself if she catches herself in a spiral of worrying and practice, as we did in session, returning her thoughts to the more positive ones. calmer, and more grounded by session end.  Actually practiced some of the interruption with her in session today and she responded well. ? ?Interventions: Solution-Oriented/Positive Psychology, Ego-Supportive, and Insight-Oriented ? ?Treatment Goal Plan:  ?Patient not signing treatment plan on computer screen due to [place]. ?Treatment Goals: ?Treatment goals will remain on treatment plan as patient works with strategies to achieve her goals.  Progress will be noted each session and documented in the "progress" section of note. ?Long term goal: ?Develop the ability to recognize, accept, and cope with feelings of anxiety and depression. ?Short term goal: ?Verbalize any unresolved grief issues that may be contributing to anxiety and depression. ?Strategy: ?Replace negative self-defeating self talk with verbalization of realistic and positive cognitive messages to lessen depression/anxiety and improve mood. ? ?Diagnosis: ?  ICD-10-CM   ?1. Generalized anxiety disorder  F41.1   ?  ? ?Plan:  Patient today showing motivation and active participation in session as she continues to work on her anxiety and depression.  Feels that anxiety has been her stronger symptom but "sometimes I go back and forth".  Multiple healthcare challenges continued to confront patient and her husband.  They do have good support from their family  that live nearby and also from their church.  Husband's health issues led him to be hospitalized recently but he is out and doing better at this point.  That whole experience was very stressful for patient and very hard for her to limit her negative thinking and not be "assuming worst case scenarios".  Patient wanting to stop that train of  thought but feels helpless at times to stop it.  Can sometimes be engaged and feel like some progress is being made and not assuming the worst and instead being able to hope for the best and trust the really good support that they do have from family and friends. Encouraged patient in her practice of more positive behaviors and good self-care including: Not overly focusing on negatives nor assuming worst case scenarios, remaining on her medications as prescribed, listening to music which she finds soothing, consider doing more activities that she enjoys such as reading and talking with friends on the phone, getting books from her nearby [employer], moving about carefully for any exercise, allow for good sleep patterns and not sleeping too much during the day, staying in the present and focused on what she can control or change, remain in contact with family and friends who are supportive, and recognize the strength she shows working with goal-directed behaviors to move in a direction that supports her improved emotional health and overall wellbeing. ? ?Goal review and progress/challenges noted with patient ? ?Next appointment within 3 weeks. ? ?This record has been created using [employer].  Chart creation errors have been sought, but may not always have been located and corrected.  Such creation errors do not reflect on the standard of medical care provided. ? ? ?Shanon Ace, LCSW ? ? ? ? ? ? ? ? ? ? ? ? ? ? ? ? ? ? ?

## 2021-10-12 NOTE — Progress Notes (Signed)
? ? Benito Mccreedy D.Merril Abbe ?[facility] ?[place] ?Phone: [phone] ?  ?Assessment and Plan:   ?  ?1. Chronic left-sided low back pain with left-sided sciatica ?2. DDD (degenerative disc disease), lumbar ?3. Primary osteoarthritis of left hip ?-Chronic with exacerbation, initial sports medicine visit ?- Multiple musculoskeletal complaints with most prominent being left lower back rating to left hip ?- Pain is likely multifactorial with osteoarthritis of left hip and DDD lumbar spine.  Patient does have history of chronic L1 vertebral fracture, though this does not correlate with patient's presenting symptoms today ?- Start Tylenol 500 mg 2-3 times a day for daily pain relief ?- Start physical therapy for left hip, core, back strengthening ?- Recommend stationary bike, elliptical, water aerobics for exercise that avoids repetitive foot strike and may decrease back and hip pain ?  ?Pertinent previous records reviewed include CT abdomen pelvis 06/29/2021, CT abdomen pelvis 03/07/2021, MR lumbar spine 02/28/2021 ?  ?Follow Up: 3 to 4 weeks for reevaluation ?  ?Subjective:   ?I, Tracy Malone, am serving as a Education administrator for [employer] ? ?Chief Complaint: left side back pain ? ?HPI:  ?10/13/2021 ?Patient is a 88 year old female complaining of left side back pain. Patient states had surgery on back 2 years ago pcp thinks its a muscle pain got a nerve block and fell and fx her ribs a year ago, back pain has been bothering her for about 2 years from PT had a bad experience does get radiating pain down to her leg feels like a spasm cant sit for more than 30 mins laying Is awful standing isnt great either pain also radiate around to the front would like PT  ? ?Relevant Historical Information: History of L1 vertebral fracture, Parkinson's with essential tremor ? ?Additional pertinent review of systems negative. ? ? ?Current Outpatient Medications:  ?  acetaminophen  (TYLENOL) 650 MG CR tablet, Take 650 mg by mouth every 8 (eight) hours as needed for pain., Disp: , Rfl:  ?  ALPRAZolam (XANAX) 0.5 MG tablet, Take four tablets daily as needed for anxiety., Disp: 120 tablet, Rfl: 2 ?  b complex vitamins tablet, Take 1 tablet by mouth daily., Disp: , Rfl:  ?  Calcium Citrate-Vitamin D (CALCIUM CITRATE +D PO), Take 1 tablet by mouth in the morning and at bedtime. Taking 400 mg of calcium and 500 units of vitamin D twice daily (total 800 mg calcium and 1000 units of vitamin D), Disp: , Rfl:  ?  Carbidopa-Levodopa ER (SINEMET CR) 25-100 MG tablet controlled release, Take 1 tablet by mouth in the morning, at noon, and at bedtime., Disp: 90 tablet, Rfl: 3 ?  dicyclomine (BENTYL) 10 MG capsule, Take 1 capsule (10 mg total) by mouth every 6 (six) hours as needed., Disp: 30 capsule, Rfl: 0 ?  Probiotic Product (ALIGN PO), Take 1 capsule by mouth daily., Disp: , Rfl:  ?  SYNTHROID 25 MCG tablet, TAKE 1 TABLET DAILY BEFORE BREAKFAST FOR HYPOTHYROIDISM, Disp: 90 tablet, Rfl: 0 ? ?Current Facility-Administered Medications:  ?  0.9 %  sodium chloride infusion, 500 mL, Intravenous, Once, Milus Banister, MD  ? ?Objective:   ?  ?Vitals:  ? 10/13/21 1056  ?BP: 138/78  ?Pulse: 88  ?SpO2: 97%  ?Weight: 95 lb (43.1 kg)  ?Height: '4\' 11"'$  (1.499 m)  ?  ?  ?Body mass index is 19.19 kg/m?.  ?  ?Physical Exam:   ? ?Gen: Appears well,  nad, nontoxic and pleasant ?Psych: Alert and oriented, appropriate mood and affect ?Neuro: sensation intact, strength is 5/5 in upper and lower extremities, muscle tone wnl ?Skin: no susupicious lesions or rashes ? ?Back - Normal skin, Spine with normal alignment and no deformity.   ?No tenderness to vertebral process palpation.   ?Paraspinous muscles are mildly tender and without spasm ?Straight leg raise negative ?Piriformis negative  ? ? ?Electronically signed by:  ?Benito Mccreedy D.Merril Abbe ?[facility] ?11:48 AM 10/13/21 ?

## 2021-10-13 ENCOUNTER — Other Ambulatory Visit: Payer: Self-pay

## 2021-10-13 ENCOUNTER — Ambulatory Visit (INDEPENDENT_AMBULATORY_CARE_PROVIDER_SITE_OTHER): Payer: Medicare Other | Admitting: Sports Medicine

## 2021-10-13 VITALS — BP 138/78 | HR 88 | Ht 59.0 in | Wt 95.0 lb

## 2021-10-13 DIAGNOSIS — M1612 Unilateral primary osteoarthritis, left hip: Secondary | ICD-10-CM

## 2021-10-13 DIAGNOSIS — M5136 Other intervertebral disc degeneration, lumbar region: Secondary | ICD-10-CM | POA: Diagnosis not present

## 2021-10-13 DIAGNOSIS — G8929 Other chronic pain: Secondary | ICD-10-CM | POA: Diagnosis not present

## 2021-10-13 DIAGNOSIS — M5442 Lumbago with sciatica, left side: Secondary | ICD-10-CM

## 2021-10-13 NOTE — Patient Instructions (Addendum)
Good to see you  ?Tylenol [phone] mg 2-3 times a day for pain relief  ?Recommend stationary bike , elliptical, water aerobics for physical activity  ?Pt referral  ?Left hip low back core  ?3-4 week follow up  ?

## 2021-10-19 NOTE — Therapy (Incomplete)
?OUTPATIENT PHYSICAL THERAPY THORACOLUMBAR EVALUATION ? ? ?Patient Name: Tracy Malone ?MRN: [id number] ?DOB:[birth year], 88 y.o., female ?Today's Date: 10/19/2021 ? ? ? ?Past Medical History:  ?Diagnosis Date  ? Bell's palsy 1966  ? Hx: right side facial droop, resolved per patient 04/02/19  ? Carotid artery disease ([place]) 2010  ? on vascular screening;unchanged 2013.(could not tolerate simvastatin, no other statins tried)--<30% blockage bilat 07/2011  ? Chronic abdominal pain   ? Chronic fatigue and malaise   ? Claustrophobia   ? Depression   ? treated in the past for years;stopped in 2010 for a years  ? Duodenal ulcer 1962  ? h/o  ? Dysrhythmia   ? ocassional PVC's, no current problems per patient on 04/02/19  ? Fibromyalgia   ? Frequent PVCs 07/2012  ? Seen by [facility]: benign, asymptomatic, normal EF  ? GERD (gastroesophageal reflux disease)   ? diet controlled  ? Glaucoma, narrow-angle   ? s/p laser surgery  ? History of hiatal hernia   ? during endoscopy  ? Hypothyroid 9/08  ? IBS (irritable bowel syndrome)   ? Dr. Benson Norway  ? Ischemic colitis ([place]) 11/21/2018  ? no current problems per patient on 04/02/19  ? Lichenoid keratosis 58/85/0277  ? Dr.Stinehelfer  ? Ocular migraine   ? Osteoporosis 10/11  ? Dr.Hawkes  ? Panic attack   ? Parkinson disease ([place])   ? Parkinson's disease ([place]) 06/23/2016  ? Recurrent UTI   ? has cystocele-Dr.Grewal  ? Shingles 1999  ? h/o  ? Superficial thrombophlebitis 03/2009  ? RLE  ? Trochanteric bursitis 12/2008  ? bilateral  ? ?Past Surgical History:  ?Procedure Laterality Date  ? ABDOMINAL HYSTERECTOMY    ? BACK SURGERY  05/2019  ? CATARACT EXTRACTION, BILATERAL  1995, 1996  ? EYE SURGERY Bilateral   ? laser - glaucoma  ? Flexible sigmoidoscopy    ? KYPHOPLASTY N/A 04/03/2019  ? Procedure: KYPHOPLASTY L1;  Surgeon: Melina Schools, MD;  Location: [place];  Service: Orthopedics;  Laterality: N/A;  90 mins  ? THYROIDECTOMY, PARTIAL  09/2005  ? L nodule; Dr. Harlow Asa  ? TONSILLECTOMY  1946  ?  UPPER GI ENDOSCOPY  06/27/2012  ? VAGINAL HYSTERECTOMY  1971  ? and bladder repair.  Still has ovaries  ? WISDOM TOOTH EXTRACTION    ? ?Patient Active Problem List  ? Diagnosis Date Noted  ? Rash 03/18/2021  ? Herpes zoster without complication 41/28/7867  ? Multiple allergies 03/18/2021  ? Frail elderly 03/18/2021  ? LUQ pain 10/29/2019  ? Poor appetite 10/29/2019  ? Loss of weight 10/29/2019  ? Nausea and vomiting 10/29/2019  ? Altered bowel habits 10/29/2019  ? Lumbar compression fracture ([place]) 04/03/2019  ? Compression fracture of L1 lumbar vertebra ([place]) 03/16/2019  ? Osteoporosis of lumbar spine 03/16/2019  ? Essential tremor 02/06/2019  ? Ischemic colitis ([place])   ? Leukocytosis   ? Acute colitis 11/22/2018  ? Colitis 11/22/2018  ? Acute lower UTI 11/22/2018  ? Acute cystitis without hematuria   ? Parkinson's disease ([place]) 10/24/2018  ? Gait disturbance 11/28/2017  ? Chronic lymphocytic thyroiditis 04/24/2017  ? Status post removal of thyroid nodule 04/24/2017  ? Depression with anxiety 03/16/2017  ? Subcutaneous nodules 03/16/2017  ? Aortic atherosclerosis ([place]) 11/24/2016  ? Allergy to multiple antibiotics 10/07/2016  ? Seafood allergy, anaphylaxis, subsequent encounter 10/07/2016  ? Helicobacter pylori gastritis 10/07/2016  ? Fall 09/05/2016  ? Bloating 08/15/2016  ? Severe recurrent major depression without psychotic features ([place])  04/13/2016  ?  Class: Chronic  ? Rash and nonspecific skin eruption 03/01/2016  ? Leg pain, bilateral 03/01/2016  ? Functional dyspepsia 10/13/2015  ? Subacromial bursitis 03/02/2015  ? Resting tremor 02/01/2015  ? Epigastric fullness 10/12/2014  ? Early satiety 10/12/2014  ? Cystocele 12/30/2013  ? IBS (irritable bowel syndrome) 09/30/2013  ? GERD (gastroesophageal reflux disease) 06/27/2013  ? PVC's (premature ventricular contractions) 10/02/2012  ? Depressive disorder, not elsewhere classified 10/02/2012  ? Bradycardia 08/01/2012  ? Fatigue 08/01/2012  ? Anxiety state  05/13/2012  ? Osteopenia 05/13/2012  ? Hypothyroidism 05/13/2012  ? ? ?PCP: [name], MD ? ?REFERRING PROVIDER: [name], MD ? ?REFERRING DIAG: Chronic left-sided low back pain with left-sided sciatica ? ?THERAPY DIAG:  ?No diagnosis found. ? ?ONSET DATE: *** ? ?SUBJECTIVE:                                                                                                                                                                                          ? ?SUBJECTIVE STATEMENT: ?*** ?PERTINENT HISTORY:  ?Hx of chronic L1 vertebral fracture ?Parkinson's ? ?PAIN:  ?Are you having pain? Yes: {yespain:27235::"NPRS scale: ***/10","Pain location: ***","Pain description: ***","Aggravating factors: ***","Relieving factors: ***"} ? ? ?PRECAUTIONS: None ? ?WEIGHT BEARING RESTRICTIONS No ? ?FALLS:  ?Has patient fallen in last 6 months? {fallsyesno:27318} ? ?LIVING ENVIRONMENT: ?Lives with: {[facility] lives with:25569::"lives with their family"} ?Lives in: {Lives in:25570} ?Stairs: {opstairs:27293} ?Has following equipment at home: {Assistive devices:23999} ? ?OCCUPATION: *** ? ?PLOF: {PLOF:24004} ? ?PATIENT GOALS *** ? ? ?OBJECTIVE:  ? ?DIAGNOSTIC FINDINGS:  ?MRI on 02/28/2021 findings of:  ?Ordinary lumbar spine degeneration without neural compression.  ?Scoliosis ?Remote and healed L1 compression fracture. ? ?PATIENT SURVEYS:  ?FOTO *** ? ?SCREENING FOR RED FLAGS: ?Bowel or bladder incontinence: {Yes/No:304960894} ?Cauda equina syndrome: {Yes/No:304960894} ? ?COGNITION: ? Overall cognitive status: Within functional limits for tasks assessed   ?  ?SENSATION: ?{sensation:27233} ? ?POSTURE:  ?*** ? ?PALPATION: ?*** ? ?LUMBAR ROM:  ? ?Active  A/PROM  ?10/19/2021  ?Flexion   ?Extension   ?Right lateral flexion   ?Left lateral flexion   ?Right rotation   ?Left rotation   ? (Blank rows = not tested) ? ?LE ROM: ? ?Active  Right ?10/19/2021 Left ?10/19/2021  ?Hip flexion    ?Hip extension    ?Hip abduction    ?Hip adduction    ?Hip  internal rotation    ?Hip external rotation    ?Knee flexion    ?Knee extension    ?Ankle dorsiflexion    ?Ankle plantarflexion    ?Ankle inversion    ?Ankle eversion    ? (Blank  rows = not tested) ? ?LE MMT: ? ?MMT Right ?10/19/2021 Left ?10/19/2021  ?Hip flexion    ?Hip extension    ?Hip abduction    ?Hip adduction    ?Hip internal rotation    ?Hip external rotation    ?Knee flexion    ?Knee extension    ?Ankle dorsiflexion    ?Ankle plantarflexion    ?Ankle inversion    ?Ankle eversion    ? (Blank rows = not tested) ? ?LUMBAR SPECIAL TESTS:  ?Straight leg raise test: {pos/neg:25243}, Slump test: {pos/neg:25243}, Quadrant test: {pos/neg:25243}, SI Compression/distraction test: {pos/neg:25243}, FABER test: {pos/neg:25243}, and Thomas test: {pos/neg:25243} ? ?FUNCTIONAL TESTS:  ?{Functional tests:24029} ? ?GAIT: ?Distance walked: *** ?Assistive device utilized: {Assistive devices:23999} ?Level of assistance: {Levels of assistance:24026} ?Comments: *** ? ? ? ?TODAY'S TREATMENT  ?*** ? ? ?PATIENT EDUCATION:  ?Education details: *** ?Person educated: Patient ?Education method: Explanation, Demonstration, Tactile cues, Verbal cues, and Handouts ?Education comprehension: verbalized understanding ? ? ?HOME EXERCISE PROGRAM: ?*** ? ?ASSESSMENT: ? ?CLINICAL IMPRESSION: ?Patient is a 88 y.o. female who was seen today for physical therapy evaluation and treatment for left sided low back pain.  ? ? ?OBJECTIVE IMPAIRMENTS {opptimpairments:25111}.  ? ?ACTIVITY LIMITATIONS {activity limitations:25113}.  ? ?PERSONAL FACTORS {Personal factors:25162} are also affecting patient's functional outcome.  ? ? ?REHAB POTENTIAL: {rehabpotential:25112} ? ?CLINICAL DECISION MAKING: {clinical decision making:25114} ? ?EVALUATION COMPLEXITY: {Evaluation complexity:25115} ? ? ?GOALS: ?Goals reviewed with patient? No ? ?SHORT TERM GOALS: Target date: 11/02/2021 ? ?Patient will be independent with HEP for PT progression.  ?Baseline: initial HEP  addressed ?Goal status: INITIAL ? ? ? ?LONG TERM GOALS: Target date: 11/30/2021 ? ?Patient will score *** on FOTO. ?Baseline:  ?Goal status: INITIAL ? ?2.  *** ?Baseline:  ?Goal status: INITIAL ? ?3.  *** ?Baseline:  ?Go

## 2021-10-24 ENCOUNTER — Other Ambulatory Visit: Payer: Self-pay | Admitting: Sports Medicine

## 2021-10-24 ENCOUNTER — Telehealth: Payer: Self-pay | Admitting: Sports Medicine

## 2021-10-24 ENCOUNTER — Ambulatory Visit: Payer: Medicare Other

## 2021-10-24 DIAGNOSIS — L218 Other seborrheic dermatitis: Secondary | ICD-10-CM | POA: Diagnosis not present

## 2021-10-24 DIAGNOSIS — L821 Other seborrheic keratosis: Secondary | ICD-10-CM | POA: Diagnosis not present

## 2021-10-24 DIAGNOSIS — L820 Inflamed seborrheic keratosis: Secondary | ICD-10-CM | POA: Diagnosis not present

## 2021-10-24 DIAGNOSIS — G8929 Other chronic pain: Secondary | ICD-10-CM

## 2021-10-24 DIAGNOSIS — M5136 Other intervertebral disc degeneration, lumbar region: Secondary | ICD-10-CM

## 2021-10-24 DIAGNOSIS — Z85828 Personal history of other malignant neoplasm of skin: Secondary | ICD-10-CM | POA: Diagnosis not present

## 2021-10-24 DIAGNOSIS — B353 Tinea pedis: Secondary | ICD-10-CM | POA: Diagnosis not present

## 2021-10-24 DIAGNOSIS — L853 Xerosis cutis: Secondary | ICD-10-CM | POA: Diagnosis not present

## 2021-10-24 DIAGNOSIS — M1612 Unilateral primary osteoarthritis, left hip: Secondary | ICD-10-CM

## 2021-10-24 NOTE — Telephone Encounter (Signed)
New referral to [facility] was but in  ?

## 2021-10-24 NOTE — Telephone Encounter (Signed)
Patient called stating that she finally scheduled an appointment for PT with [facility] on [facility] and they called her this morning shortly before her appointment needing to cancel. Due to other scheduling issues, she is not able to see them any time soon. ?She asked if she could be referred to PT at the [facility] Location? ?Can the referral be changed? ? ?Please advise. ? ?

## 2021-10-25 ENCOUNTER — Ambulatory Visit (INDEPENDENT_AMBULATORY_CARE_PROVIDER_SITE_OTHER): Payer: Medicare Other | Admitting: Psychiatry

## 2021-10-25 DIAGNOSIS — F411 Generalized anxiety disorder: Secondary | ICD-10-CM

## 2021-10-25 NOTE — Progress Notes (Signed)
?    [facility] Counselor/Therapist Progress Note ? ?Patient ID: Tracy Malone, MRN: [id number],   ? ?Date: 10/25/2021 ? ?Time Spent: 48 minutes  ? ?Treatment Type: Individual Therapy ? ?Reported Symptoms: anxiety, depression, lonely "at times"  ? ?Mental Status Exam: ? ?Appearance:   Neat     ?Behavior:  Appropriate, Sharing, and Motivated  ?Motor:  Normal  ?Speech/Language:   Clear and Coherent  ?Affect:  Depressed and anxious  ?Mood:  anxious and depressed  ?Thought process:  goal directed  ?Thought content:    Overthinking, some ruminating  ?Sensory/Perceptual disturbances:    WNL  ?Orientation:  oriented to person, place, time/date, situation, day of week, month of year, year, and stated date of October 25, 2021  ?Attention:  Good  ?Concentration:  Good and Fair  ?Memory:  WNL  ?Fund of knowledge:   Good  ?Insight:    Good and Fair  ?Judgment:   Good  ?Impulse Control:  Good  ? ?Risk Assessment: ?Danger to Self:  No ?Self-injurious Behavior: No ?Danger to Others: No ?Duty to Warn:no ?Physical Aggression / Violence:No  ?Access to Firearms a concern: No  ?Gang Involvement:No  ? ?Subjective:   Patient in today reporting anxiety, some loneliness, and depression.  Denies any SI.  Uses session today to talk more about how she can use some strategies to feel less lonely including being willing to be outside more in her neighborhood where there are close neighbors and she would have opportunities to speak with them, staying in phone contact with friends and relatives, and consider getting back out to go to her church which she really has liked and watched them on livestream.  Is also to follow up from a referral made by her doctor to get some physical therapy for a "muscle problem" and her back that has been causing pain.  Tended to be doubtful as to whether or not that would help and I encouraged patient to be willing to give it a chance since other interventions have not worked, and she did agree to do so.  Was very  neatly dressed, had her hair all fixed up and even some make-up on today and I commented on her looking very sharp which she seemed to appreciate.  Today seem to be a better day for her as far as the way she was feeling and I think getting dressed up a little more and having make-up on helped her to feel even better.  Despite some of her emotional and physical challenges, she does quite well for an 88 year old and still has a lot of determination which helps keep her going.  Continue to encourage patient in not narrowing her focus too much in a negative direction and instead try to be looking for more positives versus negatives.  Reviewed today our previous work on some thought interruptions that can help her when she gets into a spiral of worrying, as she realizes the worrying does not help her and makes her anxiety worse, so trying to focus more on positive and calm her thoughts which and practice she can do well however it is more challenging in real life situations understandably. ? ?Interventions: Solution-Oriented/Positive Psychology and Insight-Oriented ? ?Treatment Goal Plan:  ?Patient not signing treatment plan on computer screen due to [facility]. ?Treatment Goals: ?Treatment goals will remain on treatment plan as patient works with strategies to achieve her goals.  Progress will be noted each session and documented in the "progress" section of note. ?Long term  goal: ?Develop the ability to recognize, accept, and cope with feelings of anxiety and depression. ?Short term goal: ?Verbalize any unresolved grief issues that may be contributing to anxiety and depression. ?Strategy: ?Replace negative self-defeating self talk with verbalization of realistic and positive cognitive messages to lessen depression/anxiety and improve mood. ? ?Diagnosis: ?  ICD-10-CM   ?1. Generalized anxiety disorder  F41.1   ?  ? ?Plan:  Patient today showing motivation and good participation in session as she focused more on her anxiety  and depression.  As noted above, she was dressed very neatly and had her hair done and actually wearing make-up today.  It did seem to be a better day for her which was good to see.  Still struggling with anxiety and some depression and some loneliness which we talked more about today and as noted above talked at length about some ways to combat her loneliness and especially within her neighborhood where the houses are close and people do a lot of outside walking in the afternoons and early evening.  She is willing to try sitting outside some, weather permitting, with her husband at the edge of their garage for people walk by and she could easily speak to them and maintain some contact over time with them which husband feels would be helpful for her as well.  Also encouraged patient to consider try going back to her church which she dearly loves and be able to see people in person rather than just on livestream. Encouraged patient to practice more positive behaviors and positive self-care including: Listening to music which she finds soothing, consider doing more activities that she enjoys such as reading and talking with friends on the phone, getting books from her nearby [employer], not overly focusing on negatives nor assuming worst case scenarios, remaining on her medications as prescribed, moving about carefully for any exercise, allow for good sleep patterns including not sleeping too much during the day, staying in the present and focused on what she can control or change, remaining contact with family and friends who are supportive, and realize the strength she shows working with goal-directed behaviors to move in a direction that supports her improved emotional health. ? ?Goal review and progress/challenges noted with patient. ? ?Next appointment within 2 to 3 weeks. ? ?This record has been created using [employer].  Chart creation errors have been sought, but may not always have been located and  corrected.  Such creation errors do not reflect on the standard of medical care provided. ? ? ?Shanon Ace, LCSW ? ? ? ? ? ? ? ? ? ? ? ? ? ? ? ? ? ? ?

## 2021-10-26 ENCOUNTER — Encounter: Payer: Self-pay | Admitting: Adult Health

## 2021-10-26 ENCOUNTER — Ambulatory Visit (INDEPENDENT_AMBULATORY_CARE_PROVIDER_SITE_OTHER): Payer: Medicare Other | Admitting: Adult Health

## 2021-10-26 DIAGNOSIS — F411 Generalized anxiety disorder: Secondary | ICD-10-CM

## 2021-10-26 MED ORDER — ALPRAZOLAM 0.5 MG PO TABS: ORAL_TABLET | ORAL | 2 refills | Status: DC

## 2021-10-26 MED ORDER — FLUOXETINE HCL 10 MG PO CAPS: 10.0000 mg | ORAL_CAPSULE | Freq: Every day | ORAL | 3 refills | Status: DC

## 2021-10-26 NOTE — Addendum Note (Signed)
Addended by: Judy Pimple R on: 10/26/2021 10:05 AM ? ? Modules accepted: Orders ? ?

## 2021-10-26 NOTE — Progress Notes (Signed)
Tracy Malone ?[id number] ?1932-02-04 ?88 y.o. ? ?Subjective:  ? ?Patient ID:  Tracy Malone is a 88 y.o. (DOB 12/15/1931) female. ? ?Chief Complaint: No chief complaint on file. ? ? ?HPI ?Tracy Malone presents to the office today for follow-up of MDD, GAD, panic attacks and insomnia. ? ?Describes mood today as "ok". Pleasant. Tearful - "at times". Mood symptoms - reports decreased depression and anxiety. Increased irritability at times. Denies recent panic attacks. Reports decreased worry and rumination. Stating "sometimes I feel good and sometimes I don't". Taking Prozac '10mg'$  daily and feels it is helpful - using Xanax as needed. Improved interest and motivation. Taking medications as prescribed. Seeing therapist - Rinaldo Cloud. ?Energy levels lower. Using a cane today. Active, does not have a regular exercise routine. ?Enjoys some usual interests and activities. Married. Lives with husband of 2 years. 2 children 59 and 55. Son lives in [place]. Spending time with family. ?Appetite adequate. Weight stable - 95 ?Sleeping difficulties. Averages 4 hours - then up and down during the night. Reports some daytime napping. ?Focus and concentration stable. Completing tasks. Managing aspects of household. Retired.  ?Denies SI or HI.  ?Denies AH or VH. ? ?Previous medication trials: Multiple medication trials ? ? ? ?PHQ2-9   ? ?[facility] Office Visit from 03/24/2021 in [facility] Visit from 09/08/2020 in [facility] Visit from 09/03/2019 in [facility] Patient Outreach Telephone from 12/02/2018 in [facility] Visit from 08/29/2018 in [facility]  ?PHQ-2 Total Score '6 6 6 1 4  '$ ?PHQ-9 Total Score '17 7 17 '$ -- --  ? ?  ? ?[facility] from 03/07/2021 in [facility]  ?C-SSRS RISK CATEGORY No Risk  ? ?  ?  ? ?Review of Systems:  ?Review of Systems  ?Musculoskeletal:  Negative for gait problem.  ?Neurological:   Negative for tremors.  ?Psychiatric/Behavioral:    ?     Please refer to HPI  ? ?Medications: I have reviewed the patient's current medications. ? ?Current Outpatient Medications  ?Medication Sig Dispense Refill  ? FLUoxetine (PROZAC) 10 MG capsule Take 1 capsule (10 mg total) by mouth daily. 90 capsule 3  ? acetaminophen (TYLENOL) 650 MG CR tablet Take 650 mg by mouth every 8 (eight) hours as needed for pain.    ? ALPRAZolam (XANAX) 0.5 MG tablet Take four tablets daily as needed for anxiety. 120 tablet 2  ? b complex vitamins tablet Take 1 tablet by mouth daily.    ? Calcium Citrate-Vitamin D (CALCIUM CITRATE +D PO) Take 1 tablet by mouth in the morning and at bedtime. Taking 400 mg of calcium and 500 units of vitamin D twice daily (total 800 mg calcium and 1000 units of vitamin D)    ? Carbidopa-Levodopa ER (SINEMET CR) 25-100 MG tablet controlled release Take 1 tablet by mouth in the morning, at noon, and at bedtime. 90 tablet 3  ? dicyclomine (BENTYL) 10 MG capsule Take 1 capsule (10 mg total) by mouth every 6 (six) hours as needed. 30 capsule 0  ? Probiotic Product (ALIGN PO) Take 1 capsule by mouth daily.    ? SYNTHROID 25 MCG tablet TAKE 1 TABLET DAILY BEFORE BREAKFAST FOR HYPOTHYROIDISM 90 tablet 0  ? ?Current Facility-Administered Medications  ?Medication Dose Route Frequency Provider Last Rate Last Admin  ? 0.9 %  sodium chloride infusion  500 mL Intravenous Once Milus Banister, MD      ? ? ?  Medication Side Effects: None ? ?Allergies:  ?Allergies  ?Allergen Reactions  ? Iodinated Contrast Media Anaphylaxis  ? Iodine Anaphylaxis  ?  IV and topical forms. ?Other reaction(s): Unknown  ? Levsin [Hyoscyamine Sulfate]   ?  Vision problems/pt has glaucoma  ? Salmon [Fish Allergy] Hives and Shortness Of Breath  ? Shellfish Allergy Anaphylaxis  ? Tramadol Swelling  ? Remeron [Mirtazapine] Other (See Comments)  ?  Cause blurred vision and red eyes, pt has glaucoma  ? Aspirin Other (See Comments)  ?  Sever stomach  pain due to ulcer scaring.  ? Bis Subcit-Metronid-Tetracyc Swelling  ?  Tongue swelling. Face tingling ?Other reaction(s): Unknown  ? Cephalexin Hives  ?  Other reaction(s): hives  ? Ciprofloxacin Diarrhea  ? Codeine Nausea And Vomiting  ? Cyclobenzaprine Other (See Comments)  ?  Tingly/prickly sensation. ?Other reaction(s): tingly/prickly sensation  ? Darvocet [Propoxyphene N-Acetaminophen] Nausea And Vomiting  ? Demerol [Meperidine] Nausea Only  ? Dexlansoprazole Swelling  ?  Redness, swelling and peeling of both feet. ?Other reaction(s): foot pain  ? Diphedryl [Diphenhydramine] Other (See Comments)  ?  Increased pulse/small amount ok  ? Doxycycline Hyclate Other (See Comments)  ?  GI intolerance.  ? Doxycycline Hyclate   ?  Other reaction(s): GI intolerance  ? Epinephrine Other (See Comments)  ?  Breathing problems ?Other reaction(s): breathing problems/fainting  ? Erythromycin Other (See Comments)  ?  GI intolerance. ?Other reaction(s): GI  ? Fish Oil   ?  Other reaction(s): breathing problems/hives  ? Hyoscyamine   ?  Other reaction(s): eye pain  ? Latex Other (See Comments)  ?  Gloves ok.  Skin gets red from elastic in underwear and latex bandaides.  ? Meperidine Hcl   ?  Other reaction(s): vomiting  ? Nitrofurantoin Diarrhea  ? Other   ?  Other reaction(s): migraines ?Other reaction(s): Unknown ?Other reaction(s): Unknown ?Other reaction(s): Unknown ?Other reaction(s): increased pulse, faint, diarrhea  ? Prednisone Other (See Comments)  ?  Headache ?Other reaction(s): headache  ? Ra Diphedryl Allergy [Diphenhydramine Hcl]   ?  Other reaction(s): increased pulse small dose okay  ? Sertraline Hcl Swelling and Other (See Comments)  ?  Migraine ?Swelling of tongue/lip (09/2012) ?Other reaction(s): Unknown  ? Shellfish-Derived Products   ?  Other reaction(s): Unknown  ? Sulfa Antibiotics Other (See Comments)  ?  Increased pulse, fainting, diarrhea, thrush  ? Wellbutrin [Bupropion] Other (See Comments)  ?  Headaches   ? Xylocaine [Lidocaine Hcl]   ?  With epinephrine, given by dentist.  Speeded up heart rate and she passed out (occured twice, at dentist)  ? Xylocaine [Lidocaine]   ?  Other reaction(s): Unknown  ? Biaxin [Clarithromycin] Rash  ?  Started after completing 10 day course of 2000 mg /day, Lips swelling  ? Ibuprofen Other (See Comments)  ?  Motrin ok with a GI effect. ?Other reaction(s): rash Motrin okay with a GI effect  ? ? ?Past Medical History:  ?Diagnosis Date  ? Bell's palsy 1966  ? Hx: right side facial droop, resolved per patient 04/02/19  ? Carotid artery disease ([place]) 2010  ? on vascular screening;unchanged 2013.(could not tolerate simvastatin, no other statins tried)--<30% blockage bilat 07/2011  ? Chronic abdominal pain   ? Chronic fatigue and malaise   ? Claustrophobia   ? Depression   ? treated in the past for years;stopped in 2010 for a years  ? Duodenal ulcer 1962  ? h/o  ? Dysrhythmia   ?  ocassional PVC's, no current problems per patient on 04/02/19  ? Fibromyalgia   ? Frequent PVCs 07/2012  ? Seen by  Cards: benign, asymptomatic, normal EF  ? GERD (gastroesophageal reflux disease)   ? diet controlled  ? Glaucoma, narrow-angle   ? s/p laser surgery  ? History of hiatal hernia   ? during endoscopy  ? Hypothyroid 9/08  ? IBS (irritable bowel syndrome)   ? Dr. Benson [place]  ? Ischemic colitis ([place]) 11/21/2018  ? no current problems per patient on 04/02/19  ? Lichenoid keratosis 16/55/3748  ? Dr.Stinehelfer  ? Ocular migraine   ? Osteoporosis 10/11  ? Dr.Hawkes  ? Panic attack   ? Parkinson disease ([place])   ? Parkinson's disease ([place]) 06/23/2016  ? Recurrent UTI   ? has cystocele-Dr.Grewal  ? Shingles 1999  ? h/o  ? Superficial thrombophlebitis 03/2009  ? RLE  ? Trochanteric bursitis 12/2008  ? bilateral  ? ? ?Past Medical History, Surgical history, Social history, and Family history were reviewed and updated as appropriate.  ? ?Please see review of systems for further details on the patient's review from today.   ? ?Objective:  ? ?Physical Exam:  ?LMP  (LMP Unknown)  ? ?Physical Exam ?Constitutional:   ?   General: She is not in acute distress. ?Musculoskeletal:     ?   General: No deformity.  ?Neurological:  ?   Men

## 2021-10-26 NOTE — Therapy (Signed)
?OUTPATIENT PHYSICAL THERAPY THORACOLUMBAR EVALUATION ? ? ?Patient Name: Tracy Malone ?MRN: [id number] ?DOB:Nov 19, 1931, 88 y.o., female ?Today's Date: 10/27/2021 ? ? PT End of Session - 10/27/21 1151   ? ? Visit Number 1   ? Date for PT Re-Evaluation 12/22/21   ? Authorization Type Medicare   ? PT Start Time 1145   ? PT Stop Time 1230   ? PT Time Calculation (min) 45 min   ? Activity Tolerance Patient tolerated treatment well   ? ?  ?  ? ?  ? ? ?Past Medical History:  ?Diagnosis Date  ? Bell's palsy 1966  ? Hx: right side facial droop, resolved per patient 04/02/19  ? Carotid artery disease ([place]) 2010  ? on vascular screening;unchanged 2013.(could not tolerate simvastatin, no other statins tried)--<30% blockage bilat 07/2011  ? Chronic abdominal pain   ? Chronic fatigue and malaise   ? Claustrophobia   ? Depression   ? treated in the past for years;stopped in 2010 for a years  ? Duodenal ulcer 1962  ? h/o  ? Dysrhythmia   ? ocassional PVC's, no current problems per patient on 04/02/19  ? Fibromyalgia   ? Frequent PVCs 07/2012  ? Seen by [facility]: benign, asymptomatic, normal EF  ? GERD (gastroesophageal reflux disease)   ? diet controlled  ? Glaucoma, narrow-angle   ? s/p laser surgery  ? History of hiatal hernia   ? during endoscopy  ? Hypothyroid 9/08  ? IBS (irritable bowel syndrome)   ? Dr. Benson [place]  ? Ischemic colitis ([place]) 11/21/2018  ? no current problems per patient on 04/02/19  ? Lichenoid keratosis 48/54/6270  ? Dr.Stinehelfer  ? Ocular migraine   ? Osteoporosis 10/11  ? Dr.Hawkes  ? Panic attack   ? Parkinson disease ([place])   ? Parkinson's disease ([place]) 06/23/2016  ? Recurrent UTI   ? has cystocele-Dr.Grewal  ? Shingles 1999  ? h/o  ? Superficial thrombophlebitis 03/2009  ? RLE  ? Trochanteric bursitis 12/2008  ? bilateral  ? ?Past Surgical History:  ?Procedure Laterality Date  ? ABDOMINAL HYSTERECTOMY    ? BACK SURGERY  05/2019  ? CATARACT EXTRACTION, BILATERAL  1995, 1996  ? EYE SURGERY Bilateral   ? laser -  glaucoma  ? Flexible sigmoidoscopy    ? KYPHOPLASTY N/A 04/03/2019  ? Procedure: KYPHOPLASTY L1;  Surgeon: Melina Schools, MD;  Location: [facility];  Service: Orthopedics;  Laterality: N/A;  90 mins  ? THYROIDECTOMY, PARTIAL  09/2005  ? L nodule; Dr. Harlow Asa  ? TONSILLECTOMY  1946  ? UPPER GI ENDOSCOPY  06/27/2012  ? VAGINAL HYSTERECTOMY  1971  ? and bladder repair.  Still has ovaries  ? WISDOM TOOTH EXTRACTION    ? ?Patient Active Problem List  ? Diagnosis Date Noted  ? Rash 03/18/2021  ? Herpes zoster without complication 35/00/9381  ? Multiple allergies 03/18/2021  ? Frail elderly 03/18/2021  ? LUQ pain 10/29/2019  ? Poor appetite 10/29/2019  ? Loss of weight 10/29/2019  ? Nausea and vomiting 10/29/2019  ? Altered bowel habits 10/29/2019  ? Lumbar compression fracture ([facility]) 04/03/2019  ? Compression fracture of L1 lumbar vertebra ([place]) 03/16/2019  ? Osteoporosis of lumbar spine 03/16/2019  ? Essential tremor 02/06/2019  ? Ischemic colitis ([place])   ? Leukocytosis   ? Acute colitis 11/22/2018  ? Colitis 11/22/2018  ? Acute lower UTI 11/22/2018  ? Acute cystitis without hematuria   ? Parkinson's disease ([place]) 10/24/2018  ? Gait disturbance 11/28/2017  ?  Chronic lymphocytic thyroiditis 04/24/2017  ? Status post removal of thyroid nodule 04/24/2017  ? Depression with anxiety 03/16/2017  ? Subcutaneous nodules 03/16/2017  ? Aortic atherosclerosis ([place]) 11/24/2016  ? Allergy to multiple antibiotics 10/07/2016  ? Seafood allergy, anaphylaxis, subsequent encounter 10/07/2016  ? Helicobacter pylori gastritis 10/07/2016  ? Fall 09/05/2016  ? Bloating 08/15/2016  ? Severe recurrent major depression without psychotic features ([place]) 04/13/2016  ?  Class: Chronic  ? Rash and nonspecific skin eruption 03/01/2016  ? Leg pain, bilateral 03/01/2016  ? Functional dyspepsia 10/13/2015  ? Subacromial bursitis 03/02/2015  ? Resting tremor 02/01/2015  ? Epigastric fullness 10/12/2014  ? Early satiety 10/12/2014  ? Cystocele 12/30/2013  ? IBS  (irritable bowel syndrome) 09/30/2013  ? GERD (gastroesophageal reflux disease) 06/27/2013  ? PVC's (premature ventricular contractions) 10/02/2012  ? Depressive disorder, not elsewhere classified 10/02/2012  ? Bradycardia 08/01/2012  ? Fatigue 08/01/2012  ? Anxiety state 05/13/2012  ? Osteopenia 05/13/2012  ? Hypothyroidism 05/13/2012  ? ? ?PCP: [name], MD ? ?REFERRING PROVIDER: Glennon Mac, DO ? ?REFERRING DIAG:  ?M54.42,G89.29 (ICD-10-CM) - Chronic left-sided low back pain with left-sided sciatica  ?M51.36 (ICD-10-CM) - DDD (degenerative disc disease), lumbar  ?M16.12 (ICD-10-CM) - Primary osteoarthritis of left hip  ? ? ?THERAPY DIAG:  ?Back pain and left hip pain ? ?ONSET DATE: 07/24/21 ? ?SUBJECTIVE:                                                                                                                                                                                          ? ?SUBJECTIVE STATEMENT: ?3 years ago I was turning mattress around and sustained L1 compression fracture.  Had back surgery to put in "cement"; had PT and on 3rd visit did an assisted stretch by therapist and that worsened the problem; left low back pain, buttock, post/lateral thigh, leg to heel constantly and progressively worsening over time ?PERTINENT HISTORY:  ?Parkinson's disease; L1 compression fracture 2020; osteoporosis; had nerve blocks in past and fell on left side; ?Osteoporosis;  fibromyalgia;  married 22 years; negative experience with previous PT ? ?PAIN:  ?PAIN:  ?Are you having pain? Yes ?NPRS scale: 9/10 ?Pain location: left back and leg  ?  ?Aggravating factors: 30 min max; 15 min in grocery store;  supine; bending; stretching to reach overhead ?Relieving factors: heat, ice  ? ?PRECAUTIONS: Other: osteoporosis ? ?WEIGHT BEARING RESTRICTIONS No ? ?FALLS:  ?Has patient fallen in last 6 months? No ? ?LIVING ENVIRONMENT: ?Lives with: lives with their spouse ?Lives in: House/apartment ?Stairs: Yes: Internal: 17  steps; can reach both can avoid if needed;  did 1x and  it actually felt good ?Has following equipment at home: Single point cane ? ?OCCUPATION: retired ? ?PLOF: Independent with basic ADLs ? ?PATIENT GOALS get this muscle in my back better so it doesn't pinch that nerve ? ? ?OBJECTIVE:  ? ?DIAGNOSTIC FINDINGS:  ?Not recently ? ?PATIENT SURVEYS:  ?FOTO 45%  ? ?SCREENING FOR RED FLAGS: ?Bowel or bladder incontinence: No ? ? ?COGNITION: ? Overall cognitive status: Within functional limits for tasks assessed   ?  ?SENSATION: ?WFL to light touch ? ? ?POSTURE:  ?Decreased lumbar lordosis, increased thoracic kyphosis; right shoulder elevated in sitting and standing with slight right shift;  hand tremor ? ?PALPATION: ?Tender points in bil gluteals and lumbar paraspinals;  decreased thoraco lumbar fascial mobility ? ?LUMBAR ROM:  Uses partial squat to pick up items from floor;  unable to tolerate supine lying but OK with right sidelying  ? ?Active  A/PROM  ?10/27/21  ?Flexion Not tested secondary to osteoporosis  ?Extension 0  ?Right lateral flexion   ?Left lateral flexion   ?Right rotation   ?Left rotation   ? (Blank rows = not tested) ?Trunk strength: decreased trunk extensor strength 4/5 and decreased activation of lower abdominals  ?LE ROM:  WFLs, right and left knee pain with full extension ?LE MMT:  Able to rise from standard chair without UE assist  ? ?LUMBAR SPECIAL TESTS:  ?Slump test: Negative ? ? ? ?GAIT: ?Distance walked: 80 feet ?Assistive device utilized: Single point cane ?Level of assistance: Complete Independence ?Comments: shorter step length  ? ? ? ?TODAY'S TREATMENT  ?Gentle oscillations pelvic distraction and neutral gapping with no relief in right sidelying;  Unable to tolerate supine to try manual techniques in this position ? ? ?PATIENT EDUCATION:  ?Education details: treatment plan for opening/gapping tech ?Person educated: Patient ?Education method: Explanation ?Education comprehension: verbalized  understanding ? ? ?HOME EXERCISE PROGRAM: ?Will initiate next visit ? ?ASSESSMENT: ? ?CLINICAL IMPRESSION: ?Patient is a 88 y.o. female who was seen today for physical therapy evaluation and treatment for b

## 2021-10-27 ENCOUNTER — Encounter: Payer: Self-pay | Admitting: Physical Therapy

## 2021-10-27 ENCOUNTER — Ambulatory Visit: Payer: Medicare Other | Attending: Sports Medicine | Admitting: Physical Therapy

## 2021-10-27 DIAGNOSIS — G8929 Other chronic pain: Secondary | ICD-10-CM | POA: Diagnosis not present

## 2021-10-27 DIAGNOSIS — M1612 Unilateral primary osteoarthritis, left hip: Secondary | ICD-10-CM | POA: Diagnosis not present

## 2021-10-27 DIAGNOSIS — M5136 Other intervertebral disc degeneration, lumbar region: Secondary | ICD-10-CM | POA: Insufficient documentation

## 2021-10-27 DIAGNOSIS — M6281 Muscle weakness (generalized): Secondary | ICD-10-CM

## 2021-10-27 DIAGNOSIS — M5459 Other low back pain: Secondary | ICD-10-CM

## 2021-10-27 DIAGNOSIS — M25552 Pain in left hip: Secondary | ICD-10-CM | POA: Insufficient documentation

## 2021-10-31 ENCOUNTER — Ambulatory Visit (INDEPENDENT_AMBULATORY_CARE_PROVIDER_SITE_OTHER): Payer: Medicare Other | Admitting: Family Medicine

## 2021-10-31 ENCOUNTER — Encounter: Payer: Self-pay | Admitting: Family Medicine

## 2021-10-31 VITALS — BP 128/70 | HR 60 | Temp 97.7°F | Ht 59.0 in | Wt 94.6 lb

## 2021-10-31 DIAGNOSIS — M79672 Pain in left foot: Secondary | ICD-10-CM | POA: Diagnosis not present

## 2021-10-31 DIAGNOSIS — M79671 Pain in right foot: Secondary | ICD-10-CM | POA: Diagnosis not present

## 2021-10-31 NOTE — Progress Notes (Signed)
Chief Complaint  ?Patient presents with  ? Burning of legs  ?  Redness and burning of her legs, wakes her up from her sleep. She gets a cold washcloth in the middle of the night to try to cool them down. Aquaphor does not help, nor does Vaseline. Also has been very nauseated today-had some oatmeal and a waffle. Had 4 bowel movements since she woke up.   ? ?For 2.5 weeks she has been having redness and pain in both feet. ?Redness is around the edges and bottom of her feet, along with burning pain.  The redness/burning extends up to the ankle bones bilaterally. ?Top of the foot gets red, but doesn't hurt. Pain is mainly on the SIDES of the feet, not bottom ? ?Saw derm last week, diagnosed with athlete's foot and prescribed a cream, using it twice daily. No change noted yet. ?She denies swelling in feet ?No new shoes, always wears socks. ?Denies any itching. ? ?Tried Aquaphor, vaseline, other lotions.  No help. ?Ankle feels tight. ? ?Started gradually getting red/pink. ?The discomfort wakes her up at night. ?Legs are cold from the knees down. ?This morning at 2am she felt very warm, had to take off her bedclothes, later got very cold again. No fever/chills. ? ?When she walks at the grocery store, notes discomfort in her upper stomach, which affects her breathing.  No cough, no heartburn. ? ?4 BM this morning--first 2 normal, 3rd was narrow like pencil, last was normal. ?Denies constipation. Not having L sided abdominal pain or bloating, just the upper pain. ? ? ?PMH, PSH, SH reviewed ? ?Outpatient Encounter Medications as of 10/31/2021  ?Medication Sig Note  ? ALPRAZolam (XANAX) 0.5 MG tablet Take four tablets daily as needed for anxiety.   ? b complex vitamins tablet Take 1 tablet by mouth daily. 06/09/2021: Will take this afternoon  ? Calcium Citrate-Vitamin D (CALCIUM CITRATE +D PO) Take 1 tablet by mouth in the morning and at bedtime. Taking 400 mg of calcium and 500 units of vitamin D twice daily (total 800 mg  calcium and 1000 units of vitamin D)   ? Carbidopa-Levodopa ER (SINEMET CR) 25-100 MG tablet controlled release Take 1 tablet by mouth in the morning, at noon, and at bedtime.   ? FLUoxetine (PROZAC) 10 MG capsule Take 1 capsule (10 mg total) by mouth daily.   ? Probiotic Product (ALIGN PO) Take 1 capsule by mouth daily. 06/09/2021: Will take this afternoon  ? SYNTHROID 25 MCG tablet TAKE 1 TABLET DAILY BEFORE BREAKFAST FOR HYPOTHYROIDISM 10/06/2021: She takes 1 daily Monday through Saturday, skipping Sunday's dose.  ? acetaminophen (TYLENOL) 650 MG CR tablet Take 650 mg by mouth every 8 (eight) hours as needed for pain. (Patient not taking: Reported on 10/31/2021)   ? dicyclomine (BENTYL) 10 MG capsule Take 1 capsule (10 mg total) by mouth every 6 (six) hours as needed. (Patient not taking: Reported on 10/31/2021) 10/06/2021: As needed  ? ?Facility-Administered Encounter Medications as of 10/31/2021  ?Medication  ? 0.9 %  sodium chloride infusion  ? ?Allergies  ?Allergen Reactions  ? Iodinated Contrast Media Anaphylaxis  ? Iodine Anaphylaxis  ?  IV and topical forms. ?Other reaction(s): Unknown  ? Levsin [Hyoscyamine Sulfate]   ?  Vision problems/pt has glaucoma  ? Salmon [Fish Allergy] Hives and Shortness Of Breath  ? Shellfish Allergy Anaphylaxis  ? Tramadol Swelling  ? Remeron [Mirtazapine] Other (See Comments)  ?  Cause blurred vision and red eyes, pt has glaucoma  ?  Aspirin Other (See Comments)  ?  Sever stomach pain due to ulcer scaring.  ? Bis Subcit-Metronid-Tetracyc Swelling  ?  Tongue swelling. Face tingling ?Other reaction(s): Unknown  ? Cephalexin Hives  ?  Other reaction(s): hives  ? Ciprofloxacin Diarrhea  ? Codeine Nausea And Vomiting  ? Cyclobenzaprine Other (See Comments)  ?  Tingly/prickly sensation. ?Other reaction(s): tingly/prickly sensation  ? Darvocet [Propoxyphene N-Acetaminophen] Nausea And Vomiting  ? Demerol [Meperidine] Nausea Only  ? Dexlansoprazole Swelling  ?  Redness, swelling and peeling  of both feet. ?Other reaction(s): foot pain  ? Diphedryl [Diphenhydramine] Other (See Comments)  ?  Increased pulse/small amount ok  ? Doxycycline Hyclate Other (See Comments)  ?  GI intolerance.  ? Doxycycline Hyclate   ?  Other reaction(s): GI intolerance  ? Epinephrine Other (See Comments)  ?  Breathing problems ?Other reaction(s): breathing problems/fainting  ? Erythromycin Other (See Comments)  ?  GI intolerance. ?Other reaction(s): GI  ? Fish Oil   ?  Other reaction(s): breathing problems/hives  ? Hyoscyamine   ?  Other reaction(s): eye pain  ? Latex Other (See Comments)  ?  Gloves ok.  Skin gets red from elastic in underwear and latex bandaides.  ? Meperidine Hcl   ?  Other reaction(s): vomiting  ? Nitrofurantoin Diarrhea  ? Other   ?  Other reaction(s): migraines ?Other reaction(s): Unknown ?Other reaction(s): Unknown ?Other reaction(s): Unknown ?Other reaction(s): increased pulse, faint, diarrhea  ? Prednisone Other (See Comments)  ?  Headache ?Other reaction(s): headache  ? Ra Diphedryl Allergy [Diphenhydramine Hcl]   ?  Other reaction(s): increased pulse small dose okay  ? Sertraline Hcl Swelling and Other (See Comments)  ?  Migraine ?Swelling of tongue/lip (09/2012) ?Other reaction(s): Unknown  ? Shellfish-Derived Products   ?  Other reaction(s): Unknown  ? Sulfa Antibiotics Other (See Comments)  ?  Increased pulse, fainting, diarrhea, thrush  ? Wellbutrin [Bupropion] Other (See Comments)  ?  Headaches  ? Xylocaine [Lidocaine Hcl]   ?  With epinephrine, given by dentist.  Speeded up heart rate and she passed out (occured twice, at dentist)  ? Xylocaine [Lidocaine]   ?  Other reaction(s): Unknown  ? Biaxin [Clarithromycin] Rash  ?  Started after completing 10 day course of 2000 mg /day, Lips swelling  ? Ibuprofen Other (See Comments)  ?  Motrin ok with a GI effect. ?Other reaction(s): rash Motrin okay with a GI effect  ? ?ROS:  no fever, chills.  +hot/cold last night. ?No URI symptoms.  Depression stable  (present, but improved). ?No n/v/d, no constipation currently. ?Some breathing issues related to upper abdominal discomfort when walking at grocery store. Currently denies any shortness of breath, no chest pain. ? ? ? ?PHYSICAL EXAM: ? ?BP 128/70   Pulse 60   Temp 97.7 ?F (36.5 ?C) (Tympanic)   Ht '4\' 11"'$  (1.499 m)   Wt 94 lb 9.6 oz (42.9 kg)   LMP  (LMP Unknown)   BMI 19.11 kg/m?  ? ?Frail, elderly female, in no distress ?HEENT: conjunctiva and sclera are clear, EOMI ?Neck: no lymphadenopathy or mass ?Heart: regular rate and rhythm ?Lungs: clear bilaterally ?Back: Spine NT. Tender at L lower lumbar back muscles, unchanged from lats visit. Mild discomfort related to back with position changes (sitting to laying). ?Abdomen: soft, nontender, nondistended. ?She has some mild tenderness along the inferior ribs on the left (medially)--this is the area that hurt when she took deep breaths;  nontender at ribs on right  side. ?Extremities: Unable to move toes on R foot, different than how she can move the toes on the left (states this is chronic x years, intermittent; not a new neuro finding). ?When feet are down there is some erythema (not really violaceous or mottled, just pink) of the bottom of the feet, extending to the sides of the feet and over the toes.  This immediately resolves when legs are elevated.  The associated burning resolves with leg elevation. ?There is slight flakiness at the edges of the feet, not much on the bottom. No maceration between the toes. ? ? ?ASSESSMENT/PLAN: ? ?Bilateral foot pain - suspect related to hyperemia/venous congestion. Compression and leg elevation.  ? ?Suspect burning in feet is related to slight vascular congestion/venous issue. Symptoms resolve with elevation.  Discussed sleeping with feet elevated to avoid being woken up with burning pain.  Can elevate legs during the day, and use compression socks when she can't. ? ?Possibly has partially treated tinea pedis; to complete  another week of antifungal. ? ?F/u later this week for AWV as scheduled. ?Pt has questions regarding labs, what med she can take. ?Lipids not needed, but will check fasting glu. ?She can take her meds with wate

## 2021-10-31 NOTE — Patient Instructions (Signed)
The redness of your feet is positional, and disappears when you elevated them.  Your burning pain also went away when the legs were elevated and no longer red. ?Try elevating your feet (use a pillow under them) when you sleep, in order to not be woken up with pain. ?You can keep your legs elevated when relaxing at home. ?Consider wearing compression socks to see if this alleviates the burning pain when you need to keep them down or be walking around. ? ?I would continue the antifungal cream for another week.  I think the flaking around the edges of your feet is unrelated to the burning and redness. ? ? ?

## 2021-11-01 ENCOUNTER — Encounter: Payer: Self-pay | Admitting: Family Medicine

## 2021-11-01 DIAGNOSIS — K8020 Calculus of gallbladder without cholecystitis without obstruction: Secondary | ICD-10-CM | POA: Insufficient documentation

## 2021-11-01 DIAGNOSIS — Z6820 Body mass index (BMI) 20.0-20.9, adult: Secondary | ICD-10-CM | POA: Diagnosis not present

## 2021-11-01 DIAGNOSIS — Z01419 Encounter for gynecological examination (general) (routine) without abnormal findings: Secondary | ICD-10-CM | POA: Diagnosis not present

## 2021-11-01 DIAGNOSIS — M791 Myalgia, unspecified site: Secondary | ICD-10-CM | POA: Diagnosis not present

## 2021-11-01 NOTE — Patient Instructions (Addendum)
Discussed monthly self breast exams and yearly mammograms after the age of 78; at least 30 minutes of aerobic activity at least 5 days/week; proper sunscreen use reviewed; healthy diet, including goals of calcium and vitamin D intake and alcohol recommendations (less than or equal to 1 drink/day) reviewed; regular seatbelt use; changing batteries in smoke detectors.  Immunization recommendations discussed.  Colonoscopy recommendations reviewed ? ?Ms. Kerkman , ?Thank you for taking time to come for your Medicare Wellness Visit. I appreciate your ongoing commitment to your health goals. Please review the following plan we discussed and let me know if I can assist you in the future.  ? ?This is a list of the screening recommended for you and due dates:  ?Health Maintenance  ?Topic Date Due  ? Zoster (Shingles) Vaccine (1 of 2) Never done  ? Tetanus Vaccine  09/24/2019  ? COVID-19 Vaccine (5 - Booster for Pfizer series) 09/06/2020  ? Flu Shot  02/21/2022  ? Pneumonia Vaccine  Completed  ? DEXA scan (bone density measurement)  Completed  ? HPV Vaccine  Aged Out  ? ?You are past due to get a tetanus booster.  Please get TdaP from the pharmacy. ? ?COVID booster (the newer bivalent) is recommended.  You need to get this 2 weeks apart from any other vaccine.  ? ?I recommend getting the new shingles vaccine (Shingrix). Since you have Medicare, you will need to get this from the pharmacy, as it is covered by Part D.  Under part D coverage, there is no longer a copay for this vaccine. ?This is a series of 2 injections, spaced 2 months apart.   ?This should be separated from other vaccines by at least 2 weeks. ?If you do not have Part D coverage, and it is cost prohibitive from your usual pharmacy, check the price at [facility]. ?This isn't as urgent of a recommendation as the tetanus and COVID booster, given that you recently had shingles. ? ?Please bring [place] copies of your Living Will and [place] so that it  can be scanned into your medical chart. ? ?Try using the flonase just once daily (2 sprays at a time if you can tolerate it, otherwise just 1). If this continues to irritate your nose, you can try a different spray (nasacort, nasonex, etc).  Be sure to aim it away from the midline, and continue to use saline at least 2x/day (can be more, if needed). ? ?I am very concerned about the degree of osteoporosis and your risk for recurrent compression fractures in your spine, or the risk for hip fracture. I highly recommend that you have your osteoporosis treated, per your discussions with Dr. Buddy Duty.  I will try and get any last notes from visits with him (I only have the one from 07/2020).  But I don't want you to ignore this. ? ? ?

## 2021-11-01 NOTE — Progress Notes (Signed)
?Chief Complaint  ?Patient presents with  ? AWV fasting   ?  AWV visit, fasting, no booster and doesn't want it, no tdap and shingrix either. Has upcoming appt with Dr. Buddy Duty soon, treatment for osteoporosis is taking calicum, never got reclast infusion, saw obgyn yesterday and Doctor doesn't recommend Mammo yet since she recently had shingles, needs more time to heal- hasn't seen cardiology in years, doesn't see Dr. Nelva Bush anymore, doesn't see Rheum nor Urologist anymore  ? ? ?Tracy Malone is a 88 y.o. female who presents for annual wellness visit and follow-up on chronic medical conditions.   ? ?She has h/o chronic constipation and frequent bloating, and has been under the care of GI for these complaints. She reports her bowels have been pretty good recently. ?She takes Electronics engineer probiotic. She has Bentyl to use prn for abdominal pain (used it x2d a couple of weeks ago).  She uses Miralax prn. ? ?Hypothyroidism:  She reports compliance with taking her Synthroid 66mg 6 days/week, skipping Sunday's.  She takes it on an empty stomach, separate from her other medications. She denies any significant changes to hair/skin/energy. She has chronic dryness to her skin; she continues to notice hair loss and thinning. She has chronic issues with fatigue and depression, not any worse.  Thyroid was adequately replaced on this dose per recent check. ? ?Lab Results  ?Component Value Date  ? TSH 2.180 10/06/2021  ? ?Allergies, sinus issues.  In a visit last month she was having some L sided nasal pain and cheek swelling, but mucus was clear.  She still notes this.  Mucus that she expectorates is a little brown.  She is using her homemade saline twice daily. She has been using Flonase 1 spray twice daily, notices some irritation. ? ?Osteoporosis, with L1 compression fracture, s/p kyphoplasty. She had known osteoporosis prior to this fracture (DEXA done in 2016 by Dr. HTrudie Reed. She stopped Prolia due to joint pains.  She has seen Dr.  KBuddy Dutyin follow-up to discuss her osteoporosis.  Last note we have was prior to the DEXA being repeated.  They had discussed Reclast infusion. This was never done.  She reports not having had any treatment of her osteoporosis.  She sees him once a year.  We do not have any notes since 07/2020. Will call for notes. ?DEXA was done by Dr. GHelane Rima1/27/22 ?T-4.1 at R femoral neck, -3.7 at L femoral neck, -3.2 spine. ?Vitamin D level was normal at 50 in 08/2019. ? ?Chronic back pain--she had no benefit from injections through [facility] in the past.  She most recently saw Dr. JGlennon Mac who reports that pain is likely multifactorial with osteoarthritis of left hip and DDD lumbar spine. She has started physical therapy.  She reports she has a muscular component to her L-sided back pain, and plans to try dry needling. ? ?Depression:  Under the care of psychiatrist and therapist. She is doing a little better. ? ?Parkinson's and Essential Tremor, under the care of Dr. SFelecia Shelling She last saw him in 05/2021. She continues on Sinemet 25/100 TID.  This was noted to improve her gait, but didn't affect tremor much.  Propranolol helped with tremor, but wasn't tolerated due to low BP. She has a lot of concerns and worries about side effects from this medication, but continues to take it. ?Tremor interferes with her writing, but not with her ADL's. ? ?Atherosclerotic calcification of the aortic knob was noted on CXR in 02/2020. ?She was also noted  to have 20-30% stenosis of R ICA on [place] in 2013, mild plaque noted. ?She was intolerant of statins due to myalgias. She refuses to retry these. ?She also had incidental finding of gallstones. She remains asymptomatic. ? ?Immunization History  ?Administered Date(s) Administered  ? Fluad Quad(high Dose 65+) 05/15/2019, 04/14/2021  ? Influenza Split 05/16/2011, 06/22/2015, 05/12/2016  ? Influenza, High Dose Seasonal PF 06/13/2013, 04/28/2014, 05/12/2017, 04/17/2018, 05/13/2020  ? Influenza-Unspecified  05/17/2016  ? PFIZER(Purple Top)SARS-COV-2 Vaccination 08/28/2019, 09/18/2019, 04/05/2020, 07/12/2020  ? Pneumococcal Conjugate-13 09/29/2013  ? Pneumococcal Polysaccharide-23 08/07/1999, 08/29/2018  ? Td 08/06/1997, 09/23/2009  ? Tdap 09/23/2009  ? ?Last Pap smear: 03/2015 with Dr. Helane Rima; She sees Dr. Helane Rima regularly (seen yesterday). ?Last mammogram: 06/2019.  Couldn't do it due to the ongoing pain from shingles at the L chest. ?Last colonoscopy: 2006; negative Cologard 04/2015 and 05/2018 ?Last DEXA: 07/2020 through Dr. Georgia Duff at R femoral neck, -3.7 at L femoral neck, -3.2 spine ?Dentist: twice yearly ?Ophtho: yearly ?Exercise: Home exercises from Dr. Glennon Mac.  Walks some in the house. Has some hand weights that she uses when sitting. ? ?Vitamin D-OH was 50 in 08/2019 ?Lipids: ?Lab Results  ?Component Value Date  ? CHOL 198 03/10/2021  ? HDL 66 03/10/2021  ? LDLCALC 112 (H) 03/10/2021  ? TRIG 111 03/10/2021  ? CHOLHDL 3.0 03/10/2021  ? ?Patient Care Team: ?[name], MD as PCP - General (Family Medicine) ?Duffy, Creola Corn, LCSW as Education officer, museum (Licensed Holiday representative) ?Conan Bowens, RN as Registered Nurse [facility] and Palliative Medicine) ?Viona Gilmore, [facility] as Pharmacist (Pharmacist) ?GYN:  Dr. Helane Rima ?Ophtho: Dr. Tora Kindred ?Dentist:  Dr. Sandria Bales ?GI: Dr. Ardis Hughs ?Neuro: [name]  ?Cardiology:  Dr. Johnsie Cancel (in the past) ?Derm: Dr. Derrel Nip ?Psychiatrist: Deloria Lair, NP ?Psychologist:  Shanon Ace ?Endocrine: Dr. Buddy Duty ?Podiatry: Dr. Elisha Ponder (no longer sees) ?Ortho: Dr. Eugenie Filler (or ankle), Dr. Rolena Infante (back)--no longer sees ?Pain: Dr. Nelva Bush (no longer sees) ?Veins: Dr. Renaldo Reel ?Rheum:  Dr. [name] (previously Dr. Trudie Reed), no longer sees ?Urologist: Dr. Pilar Jarvis (no longer sees) ? ?Depression Screening: ?[place] Office Visit from 11/02/2021 in [place]  ?PHQ-2 Total Score 6  ? ?  ?  ?[place] Office Visit from 11/02/2021 in [place]   ?PHQ-9 Total Score 18  ? ?  ? ? ?Falls screen:  ? ?  11/02/2021  ?  9:59 AM 04/14/2021  ? 11:26 AM 09/08/2020  ?  9:47 AM 09/03/2019  ?  9:51 AM 08/29/2018  ?  9:40 AM  ?Fall Risk   ?Falls in the past year? 1 0 1 0 0  ?Number falls in past yr: 0 0 0    ?Injury with Fall? 1 0 1    ?Comment   04/30/20-2 fx ribs (L side)    ?Risk for fall due to : Impaired balance/gait History of fall(s)     ?Follow up Falls evaluation completed Falls evaluation completed     ?  ?Only fall was 04/2020, none since then ?NO falls in the last year.  Fall screen corrected/edited. ? ?Functional Status Survey: ?Is the patient deaf or have difficulty hearing?: No ?Does the patient have difficulty seeing, even when wearing glasses/contacts?: Yes ?Does the patient have difficulty concentrating, remembering, or making decisions?: No ?Does the patient have difficulty walking or climbing stairs?: Yes ?Does the patient have difficulty dressing or bathing?: No ?Does the patient have difficulty doing errands alone such as visiting a doctor's office or shopping?: No ? ?  Mini-Cog Scoring: 3 She drew a circle (tiny!) with numbers along the edges. No hands on the clock, circled  the 3 and wrote at 3:00 (told nurse she only had digital clock). ?Had normal recall x3. ? ? ?End of Life: She has a healthcare power of attorney and living will. ? ? ?PMH, PSH, SH and FH reviewed ? ?Outpatient Encounter Medications as of 11/02/2021  ?Medication Sig Note  ? acetaminophen (TYLENOL) 650 MG CR tablet Take 650 mg by mouth every 8 (eight) hours as needed for pain.   ? ALPRAZolam (XANAX) 0.5 MG tablet Take four tablets daily as needed for anxiety.   ? b complex vitamins tablet Take 1 tablet by mouth daily. 06/09/2021: Will take this afternoon  ? Calcium Citrate-Vitamin D (CALCIUM CITRATE +D PO) Take 1 tablet by mouth in the morning and at bedtime. Taking 400 mg of calcium and 500 units of vitamin D twice daily (total 800 mg calcium and 1000 units of vitamin D)   ?  Carbidopa-Levodopa ER (SINEMET CR) 25-100 MG tablet controlled release Take 1 tablet by mouth in the morning, at noon, and at bedtime.   ? FLUoxetine (PROZAC) 10 MG capsule Take 1 capsule (10 mg total) by mouth daily

## 2021-11-02 ENCOUNTER — Encounter: Payer: Self-pay | Admitting: Family Medicine

## 2021-11-02 ENCOUNTER — Ambulatory Visit (INDEPENDENT_AMBULATORY_CARE_PROVIDER_SITE_OTHER): Payer: Medicare Other | Admitting: Family Medicine

## 2021-11-02 ENCOUNTER — Encounter: Payer: Medicare Other | Admitting: Physical Therapy

## 2021-11-02 VITALS — BP 110/64 | HR 68 | Ht <= 58 in | Wt 96.6 lb

## 2021-11-02 DIAGNOSIS — I700 Atherosclerosis of aorta: Secondary | ICD-10-CM | POA: Diagnosis not present

## 2021-11-02 DIAGNOSIS — E46 Unspecified protein-calorie malnutrition: Secondary | ICD-10-CM | POA: Diagnosis not present

## 2021-11-02 DIAGNOSIS — Z0000 Encounter for general adult medical examination without abnormal findings: Secondary | ICD-10-CM

## 2021-11-02 DIAGNOSIS — K8020 Calculus of gallbladder without cholecystitis without obstruction: Secondary | ICD-10-CM

## 2021-11-02 DIAGNOSIS — D692 Other nonthrombocytopenic purpura: Secondary | ICD-10-CM | POA: Diagnosis not present

## 2021-11-02 DIAGNOSIS — T466X5A Adverse effect of antihyperlipidemic and antiarteriosclerotic drugs, initial encounter: Secondary | ICD-10-CM

## 2021-11-02 DIAGNOSIS — Z5181 Encounter for therapeutic drug level monitoring: Secondary | ICD-10-CM | POA: Diagnosis not present

## 2021-11-02 DIAGNOSIS — M545 Low back pain, unspecified: Secondary | ICD-10-CM

## 2021-11-02 DIAGNOSIS — G250 Essential tremor: Secondary | ICD-10-CM

## 2021-11-02 DIAGNOSIS — G20 Parkinson's disease: Secondary | ICD-10-CM

## 2021-11-02 DIAGNOSIS — M810 Age-related osteoporosis without current pathological fracture: Secondary | ICD-10-CM

## 2021-11-02 DIAGNOSIS — E039 Hypothyroidism, unspecified: Secondary | ICD-10-CM | POA: Diagnosis not present

## 2021-11-02 DIAGNOSIS — F339 Major depressive disorder, recurrent, unspecified: Secondary | ICD-10-CM | POA: Diagnosis not present

## 2021-11-02 DIAGNOSIS — M791 Myalgia, unspecified site: Secondary | ICD-10-CM

## 2021-11-03 ENCOUNTER — Encounter: Payer: Self-pay | Admitting: Family Medicine

## 2021-11-03 ENCOUNTER — Ambulatory Visit: Payer: Medicare Other | Admitting: Sports Medicine

## 2021-11-03 LAB — COMPREHENSIVE METABOLIC PANEL
ALT: 4 IU/L (ref 0–32)
AST: 23 IU/L (ref 0–40)
Albumin/Globulin Ratio: 2.2 (ref 1.2–2.2)
Albumin: 4.4 g/dL (ref 3.6–4.6)
Alkaline Phosphatase: 69 IU/L (ref 44–121)
BUN/Creatinine Ratio: 19 (ref 12–28)
BUN: 12 mg/dL (ref 8–27)
Bilirubin Total: 1.3 mg/dL — ABNORMAL HIGH (ref 0.0–1.2)
CO2: 26 mmol/L (ref 20–29)
Calcium: 10 mg/dL (ref 8.7–10.3)
Chloride: 104 mmol/L (ref 96–106)
Creatinine, Ser: 0.64 mg/dL (ref 0.57–1.00)
Globulin, Total: 2 g/dL (ref 1.5–4.5)
Glucose: 89 mg/dL (ref 70–99)
Potassium: 4.1 mmol/L (ref 3.5–5.2)
Sodium: 142 mmol/L (ref 134–144)
Total Protein: 6.4 g/dL (ref 6.0–8.5)
eGFR: 85 mL/min/{1.73_m2} (ref >59)

## 2021-11-03 LAB — VITAMIN D 25 HYDROXY (VIT D DEFICIENCY, FRACTURES): Vit D, 25-Hydroxy: 48.9 ng/mL (ref 30.0–100.0)

## 2021-11-04 ENCOUNTER — Telehealth: Payer: Self-pay | Admitting: Adult Health

## 2021-11-04 DIAGNOSIS — D692 Other nonthrombocytopenic purpura: Secondary | ICD-10-CM | POA: Insufficient documentation

## 2021-11-04 NOTE — Telephone Encounter (Signed)
Notified patient it was okay to take at night and to let us know if sx persist.  ?

## 2021-11-04 NOTE — Telephone Encounter (Signed)
Pt LVM @ 10:15a.  She wants to know if she can take her Prozac at night, maybe around dinner time.  She is taking it in the morning and she feels tired and light-headed. ? ?Next appt. 7/5 ?

## 2021-11-05 ENCOUNTER — Ambulatory Visit: Payer: Medicare Other

## 2021-11-09 ENCOUNTER — Ambulatory Visit: Payer: Medicare Other

## 2021-11-09 DIAGNOSIS — M25552 Pain in left hip: Secondary | ICD-10-CM

## 2021-11-09 DIAGNOSIS — M1612 Unilateral primary osteoarthritis, left hip: Secondary | ICD-10-CM | POA: Diagnosis not present

## 2021-11-09 DIAGNOSIS — G8929 Other chronic pain: Secondary | ICD-10-CM | POA: Diagnosis not present

## 2021-11-09 DIAGNOSIS — M5459 Other low back pain: Secondary | ICD-10-CM

## 2021-11-09 DIAGNOSIS — M6281 Muscle weakness (generalized): Secondary | ICD-10-CM

## 2021-11-09 DIAGNOSIS — M5136 Other intervertebral disc degeneration, lumbar region: Secondary | ICD-10-CM | POA: Diagnosis not present

## 2021-11-09 NOTE — Therapy (Signed)
?OUTPATIENT PHYSICAL THERAPY TREATMENT NOTE ? ? ?Patient Name: Tracy Malone ?MRN: [id number] ?DOB:1932/02/01, 88 y.o., female ?Today's Date: 11/09/2021 ? ?PCP: [name], MD ?REFERRING PROVIDER: Glennon Mac, DO ? ?END OF SESSION:  ? PT End of Session - 11/09/21 1231   ? ? Visit Number 2   ? Date for PT Re-Evaluation 12/22/21   ? Authorization Type Medicare   ? PT Start Time 1147   ? PT Stop Time 1230   ? PT Time Calculation (min) 43 min   ? Activity Tolerance Patient tolerated treatment well   ? Behavior During Therapy [facility] for tasks assessed/performed   ? ?  ?  ? ?  ? ? ?Past Medical History:  ?Diagnosis Date  ? Bell's palsy 1966  ? Hx: right side facial droop, resolved per patient 04/02/19  ? Carotid artery disease ([place]) 2010  ? on vascular screening;unchanged 2013.(could not tolerate simvastatin, no other statins tried)--<30% blockage bilat 07/2011  ? Chronic abdominal pain   ? Chronic fatigue and malaise   ? Claustrophobia   ? Cyst of left ovary   ? last imaging 06/2021, benign, no further f/u  ? Depression   ? treated in the past for years;stopped in 2010 for a years  ? Duodenal ulcer 1962  ? h/o  ? Dysrhythmia   ? ocassional PVC's, no current problems per patient on 04/02/19  ? Fibromyalgia   ? Frequent PVCs 07/2012  ? Seen by [facility]: benign, asymptomatic, normal EF  ? Gallstones 02/2021  ? on [place]  ? GERD (gastroesophageal reflux disease)   ? diet controlled  ? Glaucoma, narrow-angle   ? s/p laser surgery  ? History of hiatal hernia   ? during endoscopy  ? Hypothyroid 03/2007  ? IBS (irritable bowel syndrome)   ? Dr. Benson Norway  ? Ischemic colitis ([place]) 11/21/2018  ? no current problems per patient on 04/02/19  ? Lichenoid keratosis 59/16/3846  ? Dr.Stinehelfer  ? Ocular migraine   ? Osteoporosis 04/2010  ? Dr.Hawkes; later consulted Dr. Buddy Duty (2022)  ? Panic attack   ? Parkinson disease ([place])   ? Parkinson's disease ([place]) 06/23/2016  ? Recurrent UTI   ? has cystocele-Dr.Grewal  ? Shingles 1999  ? h/o  ?  Superficial thrombophlebitis 03/2009  ? RLE  ? Trochanteric bursitis 12/2008  ? bilateral  ? ?Past Surgical History:  ?Procedure Laterality Date  ? ABDOMINAL HYSTERECTOMY    ? BACK SURGERY  05/2019  ? CATARACT EXTRACTION, BILATERAL  1995, 1996  ? EYE SURGERY Bilateral   ? laser - glaucoma  ? Flexible sigmoidoscopy    ? KYPHOPLASTY N/A 04/03/2019  ? Procedure: KYPHOPLASTY L1;  Surgeon: Melina Schools, MD;  Location: [facility];  Service: Orthopedics;  Laterality: N/A;  90 mins  ? THYROIDECTOMY, PARTIAL  09/2005  ? L nodule; Dr. Harlow Asa  ? TONSILLECTOMY  1946  ? UPPER GI ENDOSCOPY  06/27/2012  ? VAGINAL HYSTERECTOMY  1971  ? and bladder repair.  Still has ovaries  ? WISDOM TOOTH EXTRACTION    ? ?Patient Active Problem List  ? Diagnosis Date Noted  ? Senile purpura ([place]) 11/04/2021  ? Calculus of gallbladder without cholecystitis without obstruction 11/01/2021  ? Rash 03/18/2021  ? Herpes zoster without complication 65/99/3570  ? Multiple allergies 03/18/2021  ? Frail elderly 03/18/2021  ? LUQ pain 10/29/2019  ? Poor appetite 10/29/2019  ? Loss of weight 10/29/2019  ? Nausea and vomiting 10/29/2019  ? Altered bowel habits 10/29/2019  ? Lumbar  compression fracture ([place]) 04/03/2019  ? Compression fracture of L1 lumbar vertebra ([place]) 03/16/2019  ? Osteoporosis of multiple sites 03/16/2019  ? Essential tremor 02/06/2019  ? Ischemic colitis ([place])   ? Leukocytosis   ? Acute colitis 11/22/2018  ? Colitis 11/22/2018  ? Acute lower UTI 11/22/2018  ? Acute cystitis without hematuria   ? Parkinson's disease ([place]) 10/24/2018  ? Gait disturbance 11/28/2017  ? Chronic lymphocytic thyroiditis 04/24/2017  ? Status post removal of thyroid nodule 04/24/2017  ? Depression with anxiety 03/16/2017  ? Subcutaneous nodules 03/16/2017  ? Aortic atherosclerosis ([place]) 11/24/2016  ? Allergy to multiple antibiotics 10/07/2016  ? Seafood allergy, anaphylaxis, subsequent encounter 10/07/2016  ? Helicobacter pylori gastritis 10/07/2016  ? Fall 09/05/2016   ? Bloating 08/15/2016  ? Severe recurrent major depression without psychotic features ([place]) 04/13/2016  ?  Class: Chronic  ? Rash and nonspecific skin eruption 03/01/2016  ? Leg pain, bilateral 03/01/2016  ? Functional dyspepsia 10/13/2015  ? Subacromial bursitis 03/02/2015  ? Resting tremor 02/01/2015  ? Epigastric fullness 10/12/2014  ? Early satiety 10/12/2014  ? Cystocele 12/30/2013  ? IBS (irritable bowel syndrome) 09/30/2013  ? GERD (gastroesophageal reflux disease) 06/27/2013  ? PVC's (premature ventricular contractions) 10/02/2012  ? Depressive disorder, not elsewhere classified 10/02/2012  ? Bradycardia 08/01/2012  ? Fatigue 08/01/2012  ? Anxiety state 05/13/2012  ? Osteopenia 05/13/2012  ? Hypothyroidism 05/13/2012  ? ? ?REFERRING DIAG:  ?M54.42,G89.29 (ICD-10-CM) - Chronic left-sided low back pain with left-sided sciatica  ?M51.36 (ICD-10-CM) - DDD (degenerative disc disease), lumbar  ?M16.12 (ICD-10-CM) - Primary osteoarthritis of left hip  ? ? ?THERAPY DIAG:  ?Other low back pain ? ?Muscle weakness (generalized) ? ?Pain in left hip ? ?PERTINENT HISTORY: Parkinson's disease; L1 compression fracture 2020; osteoporosis; had nerve blocks in past and fell on left side; ?Osteoporosis;  fibromyalgia;  married 68 years; negative experience with previous PT ? ?PRECAUTIONS: osteoporosis  ? ?SUBJECTIVE: I had pain when I woke up this morning.   ? ?PAIN:  ?Are you having pain? Yes: NPRS scale: 7/10 ?Pain location: Lt back and down the leg ?Pain description: sore, painful ?Aggravating factors: bending, stretching, lifting  ?Relieving factors: ice ? ?OBJECTIVE: from evaluation on 10/27/21 ?  ?DIAGNOSTIC FINDINGS:  ?Not recently ?  ?PATIENT SURVEYS:  ?FOTO 45%  ?  ?SCREENING FOR RED FLAGS: ?Bowel or bladder incontinence: No ? ?  ?COGNITION: ?          Overall cognitive status: Within functional limits for tasks assessed               ?           ?SENSATION: ?WFL to light touch ?   ?POSTURE:  ?Decreased lumbar lordosis,  increased thoracic kyphosis; right shoulder elevated in sitting and standing with slight right shift;  hand tremor ?  ?PALPATION: ?Tender points in bil gluteals and lumbar paraspinals;  decreased thoraco lumbar fascial mobility ?  ?LUMBAR ROM:  Uses partial squat to pick up items from floor;  unable to tolerate supine lying but OK with right sidelying  ?  ?Active  A/PROM  ?10/27/21  ?Flexion Not tested secondary to osteoporosis  ?Extension 0  ?Right lateral flexion    ?Left lateral flexion    ?Right rotation    ?Left rotation    ? (Blank rows = not tested) ?Trunk strength: decreased trunk extensor strength 4/5 and decreased activation of lower abdominals  ?LE ROM:  WFLs, right and left knee pain with full  extension ?LE MMT:  Able to rise from standard chair without UE assist  ?  ?LUMBAR SPECIAL TESTS:  ?Slump test: Negative ?  ?GAIT: ?Distance walked: 80 feet ?Assistive device utilized: Single point cane ?Level of assistance: Complete Independence ?Comments: shorter step length  ?  ? ?TODAY'S TREATMENT  ?Treatment on date: 11/09/21 ?Hamstring stretch: 2x20 seconds sitting ?Piriformis stretch sitting on Rt x20 seconds, not able to perform in supine- too much pain ?Seated ball squeeze: 5" second squeeze x10 ?Hip abduction with yellow band: seated x10 ?Standing hip flexor stretch: x20 seconds Rt and Lt  ?Manual: elongation to Lt lumbar and thoracic paraspinals, gluteals and quadratus ? ?10/27/21: Gentle oscillations pelvic distraction and neutral gapping with no relief in right sidelying;  Unable to tolerate supine to try manual techniques in this position ?  ?  ?PATIENT EDUCATION:  ?Education details: [id number] ?Person educated: Patient ?Education method: Explanation ?Education comprehension: verbalized understanding ?  ?  ?HOME EXERCISE PROGRAM: ?Access Code: [email or web address] ?URL: [email or web address] ?Date: 11/09/2021 ?Prepared by: Claiborne Billings ? ?Exercises ?- Seated Hamstring Stretch  - 3 x daily - 7 x weekly - 1 sets -  3 reps - 20 hold ?- Seated Figure 4 Piriformis Stretch  - 1 x daily - 7 x weekly - 1 sets - 3 reps - 20 hold ?- Seated Hip Adduction Isometrics with Ball  - 2 x daily - 7 x weekly - 1 sets - 10 reps - 5 ho

## 2021-11-10 ENCOUNTER — Telehealth: Payer: Self-pay | Admitting: Family Medicine

## 2021-11-10 ENCOUNTER — Ambulatory Visit: Payer: Medicare Other | Admitting: Psychiatry

## 2021-11-10 NOTE — Telephone Encounter (Signed)
Called Dr. Cindra Eves office and left a message with the medical records. I advised Dr. Tomi Bamberger was requesting all records since 07/2020. I left my name, phone number and fax number.  ?

## 2021-11-11 ENCOUNTER — Encounter: Payer: Medicare Other | Admitting: Physical Therapy

## 2021-11-11 ENCOUNTER — Telehealth: Payer: Self-pay | Admitting: Gastroenterology

## 2021-11-11 NOTE — Telephone Encounter (Signed)
Patient called and wanted to know if the medications she was taking (Xanax and Carbidopa-Levodopa) would have any interaction with the dicyclomine Dr. Ardis Hughs prescribed her.  Please call patient and advise.  Thank you. ?

## 2021-11-11 NOTE — Telephone Encounter (Signed)
Called and spoke with patient. I informed pt to call and check with her pharmacist about any medication interactions. Pt verbalized understanding and had no concerns at the end of the call. ?

## 2021-11-14 ENCOUNTER — Ambulatory Visit: Payer: Medicare Other

## 2021-11-14 ENCOUNTER — Ambulatory Visit (INDEPENDENT_AMBULATORY_CARE_PROVIDER_SITE_OTHER): Payer: Medicare Other | Admitting: Gastroenterology

## 2021-11-14 ENCOUNTER — Encounter: Payer: Self-pay | Admitting: Gastroenterology

## 2021-11-14 VITALS — BP 132/64 | HR 99 | Ht 59.0 in | Wt 95.2 lb

## 2021-11-14 DIAGNOSIS — R109 Unspecified abdominal pain: Secondary | ICD-10-CM

## 2021-11-14 DIAGNOSIS — M6281 Muscle weakness (generalized): Secondary | ICD-10-CM

## 2021-11-14 DIAGNOSIS — M5459 Other low back pain: Secondary | ICD-10-CM

## 2021-11-14 DIAGNOSIS — M25552 Pain in left hip: Secondary | ICD-10-CM

## 2021-11-14 DIAGNOSIS — G8929 Other chronic pain: Secondary | ICD-10-CM | POA: Diagnosis not present

## 2021-11-14 DIAGNOSIS — M1612 Unilateral primary osteoarthritis, left hip: Secondary | ICD-10-CM | POA: Diagnosis not present

## 2021-11-14 DIAGNOSIS — M5136 Other intervertebral disc degeneration, lumbar region: Secondary | ICD-10-CM | POA: Diagnosis not present

## 2021-11-14 NOTE — Progress Notes (Signed)
Review of previous GI testing; Dr. Benson Norway and changed to Dr. Ardis Hughs 10/2014:  ?-CBC, CMP, and TSH were normal in 07/2014 and again in 08/2014. CT scan02/26/2016. This was done without contrast due to dye allergy.  This showed no acute findings in the abdomen and pelvis specifically to explain her history of abdominal pain. There is a 3.0 cm left ovarian cystic lesion that is likely benign. As I stated above, she is following with gynecology and they're going to monitor that, but they think that it is benign.  Stomach, duodenum, small bowel, and colon grossly unremarkable.Abdominal ultrasound on 09/04/2014 showed no acute or chronic abnormalities of the hepatobiliary tree; evaluation of the pancreatic tail was limited. There is no acute abnormalities of the kidney demonstrated. A 1.6 cm diameter mid to lower pole cyst was noted in the right kidney.  August 2015 HIDA scan with CCK, which was normal.  This was performed for complaints of upper abdominal pain, appetite loss, fatigue, and weight loss at that time as well.EGD was in December 2013 at which time she was found to have moderate gastritis. Biopsies actually showed H. Pylori with chronic active gastritis, however, after taking 1 dose of the Pylera medication she discontinued it because of side effects (it made her feel horrible).  Therefore, she has not completed treatment for H. Pylori. Duodenal biopsies were unremarkable at that time.  flexible sigmoidoscopy also in December 2013 that was normal with only medium hemorrhoids noted. This was performed for diarrhea and biopsies of the descending and sigmoid colon were unremarkable. She states that she had a full colonoscopy prior to that with Dr. Benson Norway, however, after calling his office they stated they have never performed a full colonoscopy on her. Gastric emptying scan in January 2014 showed 33% of the ingested activity remaining in the stomach which is mildly abnormal compared to the normal range of less than 30%  of the ingested activity remaining at 120 minutes.  Repeat GES 09/2014 was normal.  EGD 11/2014 with Dr. Ardis Hughs showed pan-gastritis with positive Hpylori on biopsies; also small hiatal hernia.  Saw ID and was apparently treated with subsequent testing negative.  Repeat abdominal ultrasound 09/2015 was normal.  07/2016 H. Pylori stool antigen was negative. Repeat EGD 08/2016: mild gastritis, + for H pylori again. Referred to ID and tried a regimen but could not complete it due ?  ?2.  Presumed ischemic colitis.  April 2020, CT scan suggested "mild circumferential wall thickening about the transverse colon, which may be related to incomplete distention or possibly mild colitis". ?  ?  ?EGD April 2021 for generalized abdominal pain, anorexia, nausea and weight loss showed mild nonspecific gastritis.  Pathology showed mild gastritis, no H. pylori. ?  ?Colonoscopy April 2021 for generalized abdominal pain, constipation, diarrhea and weight loss showed left-sided diverticulosis but was otherwise completely normal. ? ? ?HPI: ?This is a pleasant 88 year old woman ? ?I last saw her here in the office about 10 weeks ago.  We discussed her chronic left-sided abdominal pains which she had had for at least 2 or 3 years.  I again recommend no further testing since she has had extensive GI testing including imaging test colonoscopy, upper endoscopy, blood work.  Try to focus her on getting her bowels to move a little easier and I recommended she increase her MiraLAX to 2 doses once daily, continue her Colace 1 or 2 pills a day.  I refilled her Bentyl. ? ?She called several weeks ago with loose stools.  GI pathogen panel was negative.  TSH was normal, ? ?Her bowels are back to normal. ? ?She has a lot of bloating and Bentyl helps. ? ?She again brought up her left-sided abdominal pains.  These have been extensively worked up from a GI perspective. ? ?She is seeing physical therapy and might be getting some type of an injection to a  nerve root that might be causing problems around her left flank, left back. ? ? ?ROS: complete GI ROS as described in HPI, all other review negative. ? ?Constitutional:  No unintentional weight loss ? ? ?Past Medical History:  ?Diagnosis Date  ? Bell's palsy 1966  ? Hx: right side facial droop, resolved per patient 04/02/19  ? Carotid artery disease ([place]) 2010  ? on vascular screening;unchanged 2013.(could not tolerate simvastatin, no other statins tried)--<30% blockage bilat 07/2011  ? Chronic abdominal pain   ? Chronic fatigue and malaise   ? Claustrophobia   ? Cyst of left ovary   ? last imaging 06/2021, benign, no further f/u  ? Depression   ? treated in the past for years;stopped in 2010 for a years  ? Duodenal ulcer 1962  ? h/o  ? Dysrhythmia   ? ocassional PVC's, no current problems per patient on 04/02/19  ? Fibromyalgia   ? Frequent PVCs 07/2012  ? Seen by [facility]: benign, asymptomatic, normal EF  ? Gallstones 02/2021  ? on [place]  ? GERD (gastroesophageal reflux disease)   ? diet controlled  ? Glaucoma, narrow-angle   ? s/p laser surgery  ? History of hiatal hernia   ? during endoscopy  ? Hypothyroid 03/2007  ? IBS (irritable bowel syndrome)   ? Dr. Benson Norway  ? Ischemic colitis ([place]) 11/21/2018  ? no current problems per patient on 04/02/19  ? Lichenoid keratosis 76/19/5093  ? Dr.Stinehelfer  ? Ocular migraine   ? Osteoporosis 04/2010  ? Dr.Hawkes; later consulted Dr. Buddy Duty (2022)  ? Panic attack   ? Parkinson disease ([place])   ? Parkinson's disease ([place]) 06/23/2016  ? Recurrent UTI   ? has cystocele-Dr.Grewal  ? Shingles 1999  ? h/o  ? Superficial thrombophlebitis 03/2009  ? RLE  ? Trochanteric bursitis 12/2008  ? bilateral  ? ? ?Past Surgical History:  ?Procedure Laterality Date  ? ABDOMINAL HYSTERECTOMY    ? BACK SURGERY  05/2019  ? CATARACT EXTRACTION, BILATERAL  1995, 1996  ? EYE SURGERY Bilateral   ? laser - glaucoma  ? Flexible sigmoidoscopy    ? KYPHOPLASTY N/A 04/03/2019  ? Procedure: KYPHOPLASTY L1;   Surgeon: Melina Schools, MD;  Location: [facility];  Service: Orthopedics;  Laterality: N/A;  90 mins  ? THYROIDECTOMY, PARTIAL  09/2005  ? L nodule; Dr. Harlow Asa  ? TONSILLECTOMY  1946  ? UPPER GI ENDOSCOPY  06/27/2012  ? VAGINAL HYSTERECTOMY  1971  ? and bladder repair.  Still has ovaries  ? WISDOM TOOTH EXTRACTION    ? ? ?Current Outpatient Medications  ?Medication Instructions  ? acetaminophen (TYLENOL) 650 mg, Oral, Every 8 hours PRN  ? ALPRAZolam (XANAX) 0.5 MG tablet Take four tablets daily as needed for anxiety.  ? b complex vitamins tablet 1 tablet, Oral, Daily  ? Calcium Citrate-Vitamin D (CALCIUM CITRATE +D PO) 1 tablet, Oral, 2 times daily, Taking 400 mg of calcium and 500 units of vitamin D twice daily (total 800 mg calcium and 1000 units of vitamin D)  ? Carbidopa-Levodopa ER (SINEMET CR) 25-100 MG tablet controlled release 1 tablet, Oral,  3 times daily  ? dicyclomine (BENTYL) 10 mg, Oral, Every 6 hours PRN  ? FLUoxetine (PROZAC) 10 mg, Oral, Daily  ? Probiotic Product (ALIGN PO) 1 capsule, Oral, Daily  ? SYNTHROID 25 MCG tablet TAKE 1 TABLET DAILY BEFORE BREAKFAST FOR HYPOTHYROIDISM  ? ? ?Allergies as of 11/14/2021 - Review Complete 11/14/2021  ?Allergen Reaction Noted  ? Iodinated contrast media Anaphylaxis 08/27/2020  ? Iodine Anaphylaxis 05/13/2012  ? Levsin [hyoscyamine sulfate]  10/13/2015  ? Salmon [fish allergy] Hives and Shortness Of Breath 05/13/2012  ? Shellfish allergy Anaphylaxis 05/13/2012  ? Tramadol Swelling 04/11/2019  ? Remeron [mirtazapine] Other (See Comments) 08/25/2016  ? Aspirin Other (See Comments) 05/13/2012  ? Bis subcit-metronid-tetracyc Swelling 08/01/2012  ? Cephalexin Hives 05/13/2012  ? Ciprofloxacin Diarrhea 05/13/2012  ? Codeine Nausea And Vomiting 05/13/2012  ? Cyclobenzaprine Other (See Comments) 05/13/2012  ? Darvocet [propoxyphene n-acetaminophen] Nausea And Vomiting 05/13/2012  ? Demerol [meperidine] Nausea Only 05/13/2012  ? Dexlansoprazole Swelling 03/01/2016  ?  Diphedryl [diphenhydramine] Other (See Comments) 05/13/2012  ? Doxycycline hyclate Other (See Comments) 05/13/2012  ? Doxycycline hyclate  07/30/2020  ? Epinephrine Other (See Comments) 05/13/2012  ? Erythromycin Other

## 2021-11-14 NOTE — Therapy (Signed)
?OUTPATIENT PHYSICAL THERAPY TREATMENT NOTE ? ? ?Patient Name: Tracy Malone ?MRN: [id number] ?DOB:06-16-1932, 88 y.o., female ?Today's Date: 11/14/2021 ? ?PCP: [name], MD ?REFERRING PROVIDER: Glennon Mac, DO ? ?END OF SESSION:  ? PT End of Session - 11/14/21 1657   ? ? Visit Number 3   ? Date for PT Re-Evaluation 12/22/21   ? Authorization Type Medicare   ? PT Start Time 5638   ? PT Stop Time 9373   ? PT Time Calculation (min) 41 min   ? Activity Tolerance Patient tolerated treatment well   ? Behavior During Therapy [facility] for tasks assessed/performed   ? ?  ?  ? ?  ? ? ? ?Past Medical History:  ?Diagnosis Date  ? Bell's palsy 1966  ? Hx: right side facial droop, resolved per patient 04/02/19  ? Carotid artery disease ([place]) 2010  ? on vascular screening;unchanged 2013.(could not tolerate simvastatin, no other statins tried)--<30% blockage bilat 07/2011  ? Chronic abdominal pain   ? Chronic fatigue and malaise   ? Claustrophobia   ? Cyst of left ovary   ? last imaging 06/2021, benign, no further f/u  ? Depression   ? treated in the past for years;stopped in 2010 for a years  ? Duodenal ulcer 1962  ? h/o  ? Dysrhythmia   ? ocassional PVC's, no current problems per patient on 04/02/19  ? Fibromyalgia   ? Frequent PVCs 07/2012  ? Seen by [facility]: benign, asymptomatic, normal EF  ? Gallstones 02/2021  ? on [place]  ? GERD (gastroesophageal reflux disease)   ? diet controlled  ? Glaucoma, narrow-angle   ? s/p laser surgery  ? History of hiatal hernia   ? during endoscopy  ? Hypothyroid 03/2007  ? IBS (irritable bowel syndrome)   ? Dr. Benson Norway  ? Ischemic colitis ([place]) 11/21/2018  ? no current problems per patient on 04/02/19  ? Lichenoid keratosis 42/87/6811  ? Dr.Stinehelfer  ? Ocular migraine   ? Osteoporosis 04/2010  ? Dr.Hawkes; later consulted Dr. Buddy Duty (2022)  ? Panic attack   ? Parkinson disease ([place])   ? Parkinson's disease ([place]) 06/23/2016  ? Recurrent UTI   ? has cystocele-Dr.Grewal  ? Shingles 1999  ? h/o  ?  Superficial thrombophlebitis 03/2009  ? RLE  ? Trochanteric bursitis 12/2008  ? bilateral  ? ?Past Surgical History:  ?Procedure Laterality Date  ? ABDOMINAL HYSTERECTOMY    ? BACK SURGERY  05/2019  ? CATARACT EXTRACTION, BILATERAL  1995, 1996  ? EYE SURGERY Bilateral   ? laser - glaucoma  ? Flexible sigmoidoscopy    ? KYPHOPLASTY N/A 04/03/2019  ? Procedure: KYPHOPLASTY L1;  Surgeon: Melina Schools, MD;  Location: [place];  Service: Orthopedics;  Laterality: N/A;  90 mins  ? THYROIDECTOMY, PARTIAL  09/2005  ? L nodule; Dr. Harlow Asa  ? TONSILLECTOMY  1946  ? UPPER GI ENDOSCOPY  06/27/2012  ? VAGINAL HYSTERECTOMY  1971  ? and bladder repair.  Still has ovaries  ? WISDOM TOOTH EXTRACTION    ? ?Patient Active Problem List  ? Diagnosis Date Noted  ? Senile purpura ([place]) 11/04/2021  ? Calculus of gallbladder without cholecystitis without obstruction 11/01/2021  ? Rash 03/18/2021  ? Herpes zoster without complication 57/26/2035  ? Multiple allergies 03/18/2021  ? Frail elderly 03/18/2021  ? LUQ pain 10/29/2019  ? Poor appetite 10/29/2019  ? Loss of weight 10/29/2019  ? Nausea and vomiting 10/29/2019  ? Altered bowel habits 10/29/2019  ?  Lumbar compression fracture ([place]) 04/03/2019  ? Compression fracture of L1 lumbar vertebra ([place]) 03/16/2019  ? Osteoporosis of multiple sites 03/16/2019  ? Essential tremor 02/06/2019  ? Ischemic colitis ([place])   ? Leukocytosis   ? Acute colitis 11/22/2018  ? Colitis 11/22/2018  ? Acute lower UTI 11/22/2018  ? Acute cystitis without hematuria   ? Parkinson's disease ([place]) 10/24/2018  ? Gait disturbance 11/28/2017  ? Chronic lymphocytic thyroiditis 04/24/2017  ? Status post removal of thyroid nodule 04/24/2017  ? Depression with anxiety 03/16/2017  ? Subcutaneous nodules 03/16/2017  ? Aortic atherosclerosis ([place]) 11/24/2016  ? Allergy to multiple antibiotics 10/07/2016  ? Seafood allergy, anaphylaxis, subsequent encounter 10/07/2016  ? Helicobacter pylori gastritis 10/07/2016  ? Fall 09/05/2016   ? Bloating 08/15/2016  ? Severe recurrent major depression without psychotic features ([place]) 04/13/2016  ?  Class: Chronic  ? Rash and nonspecific skin eruption 03/01/2016  ? Leg pain, bilateral 03/01/2016  ? Functional dyspepsia 10/13/2015  ? Subacromial bursitis 03/02/2015  ? Resting tremor 02/01/2015  ? Epigastric fullness 10/12/2014  ? Early satiety 10/12/2014  ? Cystocele 12/30/2013  ? IBS (irritable bowel syndrome) 09/30/2013  ? GERD (gastroesophageal reflux disease) 06/27/2013  ? PVC's (premature ventricular contractions) 10/02/2012  ? Depressive disorder, not elsewhere classified 10/02/2012  ? Bradycardia 08/01/2012  ? Fatigue 08/01/2012  ? Anxiety state 05/13/2012  ? Osteopenia 05/13/2012  ? Hypothyroidism 05/13/2012  ? ? ?REFERRING DIAG:  ?M54.42,G89.29 (ICD-10-CM) - Chronic left-sided low back pain with left-sided sciatica  ?M51.36 (ICD-10-CM) - DDD (degenerative disc disease), lumbar  ?M16.12 (ICD-10-CM) - Primary osteoarthritis of left hip  ? ? ?THERAPY DIAG:  ?Other low back pain ? ?Muscle weakness (generalized) ? ?Pain in left hip ? ?PERTINENT HISTORY: Parkinson's disease; L1 compression fracture 2020; osteoporosis; had nerve blocks in past and fell on left side; ?Osteoporosis;  fibromyalgia;  married 37 years; negative experience with previous PT ? ?PRECAUTIONS: osteoporosis  ? ?SUBJECTIVE: I am hurting so bad today.   ? ?PAIN:  ?Are you having pain? Yes: NPRS scale: 9/10 ?Pain location: Lt back and down the leg ?Pain description: sore, painful ?Aggravating factors: bending, stretching, lifting  ?Relieving factors: ice ? ?OBJECTIVE: from evaluation on 10/27/21 ?  ?DIAGNOSTIC FINDINGS:  ?Not recently ?  ?PATIENT SURVEYS:  ?FOTO 45%  ?  ?SCREENING FOR RED FLAGS: ?Bowel or bladder incontinence: No ? ?  ?COGNITION: ?          Overall cognitive status: Within functional limits for tasks assessed               ?           ?SENSATION: ?WFL to light touch ?   ?POSTURE:  ?Decreased lumbar lordosis, increased  thoracic kyphosis; right shoulder elevated in sitting and standing with slight right shift;  hand tremor ?  ?PALPATION: ?Tender points in bil gluteals and lumbar paraspinals;  decreased thoraco lumbar fascial mobility ?  ?LUMBAR ROM:  Uses partial squat to pick up items from floor;  unable to tolerate supine lying but OK with right sidelying  ?  ?Active  A/PROM  ?10/27/21  ?Flexion Not tested secondary to osteoporosis  ?Extension 0  ?Right lateral flexion    ?Left lateral flexion    ?Right rotation    ?Left rotation    ? (Blank rows = not tested) ?Trunk strength: decreased trunk extensor strength 4/5 and decreased activation of lower abdominals  ?LE ROM:  WFLs, right and left knee pain with full extension ?LE  MMT:  Able to rise from standard chair without UE assist  ?  ?LUMBAR SPECIAL TESTS:  ?Slump test: Negative ?  ?GAIT: ?Distance walked: 80 feet ?Assistive device utilized: Single point cane ?Level of assistance: Complete Independence ?Comments: shorter step length  ?  ? ?TODAY'S TREATMENT  ?Treatment on date: 11/14/21 ? ?Seated ball squeeze: 5" second squeeze x10 ?Seated hamstring stretch 2x20 seconds  ?Trigger Point Dry-Needling  ?Treatment instructions: Expect mild to moderate muscle soreness. S/S of pneumothorax if dry needled over a lung field, and to seek immediate medical attention should they occur. Patient verbalized understanding of these instructions and education. ? ?Patient Consent Given: Yes ?Education handout provided: Yes ?Muscles treated: Rt and Lt gluteals ?Treatment response/outcome: twitch response and improved mobility  ? ?Skilled palpation and monitoring by PT during dry needling  ?Elongation and release after needling to Lt>Rt gluteals ? ? ?Treatment on date: 11/09/21 ?Hamstring stretch: 2x20 seconds sitting ?Piriformis stretch sitting on Rt x20 seconds, not able to perform in supine- too much pain ?Seated ball squeeze: 5" second squeeze x10 ?Hip abduction with yellow band: seated x10 ?Standing  hip flexor stretch: x20 seconds Rt and Lt  ?Manual: elongation to Lt lumbar and thoracic paraspinals, gluteals and quadratus ? ?10/27/21: Gentle oscillations pelvic distraction and neutral gapping with no rel

## 2021-11-14 NOTE — Patient Instructions (Signed)

## 2021-11-14 NOTE — Patient Instructions (Signed)
If you are age 86 or older, your body mass index should be between 23-30. Your Body mass index is 19.24 kg/m?Marland Kitchen If this is out of the aforementioned range listed, please consider follow up with your Primary Care Provider. ?________________________________________________________ ? ?The [facility] providers would like to encourage you to use [facility] to communicate with providers for non-urgent requests or questions.  Due to long hold times on the telephone, sending your provider a message by [facility] may be a faster and more efficient way to get a response.  Please allow 48 business hours for a response.  Please remember that this is for non-urgent requests.  ?_______________________________________________________ ? ?You will follow up in our office on an as needed basis. ? ?Thank you for entrusting me with your care and choosing [facility]. ? ?Dr Ardis Hughs ? ?

## 2021-11-15 DIAGNOSIS — Z20822 Contact with and (suspected) exposure to covid-19: Secondary | ICD-10-CM | POA: Diagnosis not present

## 2021-11-16 ENCOUNTER — Ambulatory Visit: Payer: Medicare Other

## 2021-11-16 DIAGNOSIS — M5459 Other low back pain: Secondary | ICD-10-CM

## 2021-11-16 DIAGNOSIS — M6281 Muscle weakness (generalized): Secondary | ICD-10-CM

## 2021-11-16 DIAGNOSIS — G8929 Other chronic pain: Secondary | ICD-10-CM | POA: Diagnosis not present

## 2021-11-16 DIAGNOSIS — M25552 Pain in left hip: Secondary | ICD-10-CM | POA: Diagnosis not present

## 2021-11-16 DIAGNOSIS — M1612 Unilateral primary osteoarthritis, left hip: Secondary | ICD-10-CM | POA: Diagnosis not present

## 2021-11-16 DIAGNOSIS — M5136 Other intervertebral disc degeneration, lumbar region: Secondary | ICD-10-CM | POA: Diagnosis not present

## 2021-11-16 NOTE — Therapy (Signed)
?OUTPATIENT PHYSICAL THERAPY TREATMENT NOTE ? ? ?Patient Name: Tracy Malone ?MRN: [id number] ?DOB:[birth year], 88 y.o., female ?Today's Date: 11/16/2021 ? ?PCP: [name], MD ?REFERRING PROVIDER: Glennon Mac, DO ? ?END OF SESSION:  ? PT End of Session - 11/16/21 1331   ? ? Visit Number 4   ? Date for PT Re-Evaluation 12/22/21   ? Authorization Type Medicare   ? PT Start Time 1231   ? PT Stop Time 1311   ? PT Time Calculation (min) 40 min   ? Activity Tolerance Patient tolerated treatment well   ? Behavior During Therapy [facility] for tasks assessed/performed   ? ?  ?  ? ?  ? ? ? ? ?Past Medical History:  ?Diagnosis Date  ? Bell's palsy 1966  ? Hx: right side facial droop, resolved per patient 04/02/19  ? Carotid artery disease ([place]) 2010  ? on vascular screening;unchanged 2013.(could not tolerate simvastatin, no other statins tried)--<30% blockage bilat 07/2011  ? Chronic abdominal pain   ? Chronic fatigue and malaise   ? Claustrophobia   ? Cyst of left ovary   ? last imaging 06/2021, benign, no further f/u  ? Depression   ? treated in the past for years;stopped in 2010 for a years  ? Duodenal ulcer 1962  ? h/o  ? Dysrhythmia   ? ocassional PVC's, no current problems per patient on 04/02/19  ? Fibromyalgia   ? Frequent PVCs 07/2012  ? Seen by [facility]: benign, asymptomatic, normal EF  ? Gallstones 02/2021  ? on [place]  ? GERD (gastroesophageal reflux disease)   ? diet controlled  ? Glaucoma, narrow-angle   ? s/p laser surgery  ? History of hiatal hernia   ? during endoscopy  ? Hypothyroid 03/2007  ? IBS (irritable bowel syndrome)   ? Dr. Benson Norway  ? Ischemic colitis ([place]) 11/21/2018  ? no current problems per patient on 04/02/19  ? Lichenoid keratosis 17/00/1749  ? Dr.Stinehelfer  ? Ocular migraine   ? Osteoporosis 04/2010  ? Dr.Hawkes; later consulted Dr. Buddy Duty (2022)  ? Panic attack   ? Parkinson disease ([place])   ? Parkinson's disease ([place]) 06/23/2016  ? Recurrent UTI   ? has cystocele-Dr.Grewal  ? Shingles 1999  ? h/o  ?  Superficial thrombophlebitis 03/2009  ? RLE  ? Trochanteric bursitis 12/2008  ? bilateral  ? ?Past Surgical History:  ?Procedure Laterality Date  ? ABDOMINAL HYSTERECTOMY    ? BACK SURGERY  05/2019  ? CATARACT EXTRACTION, BILATERAL  1995, 1996  ? EYE SURGERY Bilateral   ? laser - glaucoma  ? Flexible sigmoidoscopy    ? KYPHOPLASTY N/A 04/03/2019  ? Procedure: KYPHOPLASTY L1;  Surgeon: Melina Schools, MD;  Location: [place];  Service: Orthopedics;  Laterality: N/A;  90 mins  ? THYROIDECTOMY, PARTIAL  09/2005  ? L nodule; Dr. Harlow Asa  ? TONSILLECTOMY  1946  ? UPPER GI ENDOSCOPY  06/27/2012  ? VAGINAL HYSTERECTOMY  1971  ? and bladder repair.  Still has ovaries  ? WISDOM TOOTH EXTRACTION    ? ?Patient Active Problem List  ? Diagnosis Date Noted  ? Senile purpura ([place]) 11/04/2021  ? Calculus of gallbladder without cholecystitis without obstruction 11/01/2021  ? Rash 03/18/2021  ? Herpes zoster without complication 44/96/7591  ? Multiple allergies 03/18/2021  ? Frail elderly 03/18/2021  ? LUQ pain 10/29/2019  ? Poor appetite 10/29/2019  ? Loss of weight 10/29/2019  ? Nausea and vomiting 10/29/2019  ? Altered bowel habits 10/29/2019  ?  Lumbar compression fracture ([place]) 04/03/2019  ? Compression fracture of L1 lumbar vertebra ([place]) 03/16/2019  ? Osteoporosis of multiple sites 03/16/2019  ? Essential tremor 02/06/2019  ? Ischemic colitis ([place])   ? Leukocytosis   ? Acute colitis 11/22/2018  ? Colitis 11/22/2018  ? Acute lower UTI 11/22/2018  ? Acute cystitis without hematuria   ? Parkinson's disease ([place]) 10/24/2018  ? Gait disturbance 11/28/2017  ? Chronic lymphocytic thyroiditis 04/24/2017  ? Status post removal of thyroid nodule 04/24/2017  ? Depression with anxiety 03/16/2017  ? Subcutaneous nodules 03/16/2017  ? Aortic atherosclerosis ([place]) 11/24/2016  ? Allergy to multiple antibiotics 10/07/2016  ? Seafood allergy, anaphylaxis, subsequent encounter 10/07/2016  ? Helicobacter pylori gastritis 10/07/2016  ? Fall 09/05/2016   ? Bloating 08/15/2016  ? Severe recurrent major depression without psychotic features ([place]) 04/13/2016  ?  Class: Chronic  ? Rash and nonspecific skin eruption 03/01/2016  ? Leg pain, bilateral 03/01/2016  ? Functional dyspepsia 10/13/2015  ? Subacromial bursitis 03/02/2015  ? Resting tremor 02/01/2015  ? Epigastric fullness 10/12/2014  ? Early satiety 10/12/2014  ? Cystocele 12/30/2013  ? IBS (irritable bowel syndrome) 09/30/2013  ? GERD (gastroesophageal reflux disease) 06/27/2013  ? PVC's (premature ventricular contractions) 10/02/2012  ? Depressive disorder, not elsewhere classified 10/02/2012  ? Bradycardia 08/01/2012  ? Fatigue 08/01/2012  ? Anxiety state 05/13/2012  ? Osteopenia 05/13/2012  ? Hypothyroidism 05/13/2012  ? ? ?REFERRING DIAG:  ?M54.42,G89.29 (ICD-10-CM) - Chronic left-sided low back pain with left-sided sciatica  ?M51.36 (ICD-10-CM) - DDD (degenerative disc disease), lumbar  ?M16.12 (ICD-10-CM) - Primary osteoarthritis of left hip  ? ? ?THERAPY DIAG:  ?Other low back pain ? ?Muscle weakness (generalized) ? ?Pain in left hip ? ?PERTINENT HISTORY: Parkinson's disease; L1 compression fracture 2020; osteoporosis; had nerve blocks in past and fell on left side; ?Osteoporosis;  fibromyalgia;  married 7 years; negative experience with previous PT ? ?PRECAUTIONS: osteoporosis  ? ?SUBJECTIVE: I had a good night of sleep after I was here and had DN.  I felt very good yesterday.  Today I am feeling tightness in my legs.  ? ?PAIN:  ?Are you having pain? Yes: NPRS scale: 7/10 ?Pain location: Lt back and down the leg ?Pain description: sore, painful ?Aggravating factors: bending, stretching, lifting  ?Relieving factors: ice ? ?OBJECTIVE: from evaluation on 10/27/21 ?  ?DIAGNOSTIC FINDINGS:  ?Not recently ?  ?PATIENT SURVEYS:  ?FOTO 45%  ?  ?SCREENING FOR RED FLAGS: ?Bowel or bladder incontinence: No ? ?  ?COGNITION: ?          Overall cognitive status: Within functional limits for tasks assessed                ?           ?SENSATION: ?WFL to light touch ?   ?POSTURE:  ?Decreased lumbar lordosis, increased thoracic kyphosis; right shoulder elevated in sitting and standing with slight right shift;  hand tremor ?  ?PALPATION: ?Tender points in bil gluteals and lumbar paraspinals;  decreased thoraco lumbar fascial mobility ?  ?LUMBAR ROM:  Uses partial squat to pick up items from floor;  unable to tolerate supine lying but OK with right sidelying  ?  ?Active  A/PROM  ?10/27/21  ?Flexion Not tested secondary to osteoporosis  ?Extension 0  ?Right lateral flexion    ?Left lateral flexion    ?Right rotation    ?Left rotation    ? (Blank rows = not tested) ?Trunk strength: decreased trunk extensor  strength 4/5 and decreased activation of lower abdominals  ?LE ROM:  WFLs, right and left knee pain with full extension ?LE MMT:  Able to rise from standard chair without UE assist  ?  ?LUMBAR SPECIAL TESTS:  ?Slump test: Negative ?  ?GAIT: ?Distance walked: 80 feet ?Assistive device utilized: Single point cane ?Level of assistance: Complete Independence ?Comments: shorter step length  ?  ? ?TODAY'S TREATMENT  ?Treatment on date: 11/16/21 ?  ?Seated ball squeeze: 5" second squeeze x10 ?Hip abduction with yellow band: x20 ?Seated hamstring stretch 2x20 seconds  ?Trigger Point Dry-Needling  ?Treatment instructions: Expect mild to moderate muscle soreness. S/S of pneumothorax if dry needled over a lung field, and to seek immediate medical attention should they occur. Patient verbalized understanding of these instructions and education. ?  ?Patient Consent Given: Yes ?Education handout provided: Yes ?Muscles treated: Lt quadratus ?Treatment response/outcome: twitch response and improved mobility  ?  ?Skilled palpation and monitoring by PT during dry needling  ?Elongation and release to Lt gluteals and quadratus ?  ? ?Treatment on date: 11/14/21 ?  ?Seated ball squeeze: 5" second squeeze x10 ?Seated hamstring stretch 2x20 seconds  ?Trigger Point  Dry-Needling  ?Treatment instructions: Expect mild to moderate muscle soreness. S/S of pneumothorax if dry needled over a lung field, and to seek immediate medical attention should they occur. Patient v

## 2021-11-17 ENCOUNTER — Other Ambulatory Visit: Payer: Self-pay | Admitting: Neurology

## 2021-11-17 ENCOUNTER — Telehealth: Payer: Self-pay | Admitting: Adult Health

## 2021-11-17 NOTE — Telephone Encounter (Signed)
Patient says that about 40 minutes after taking Prozac she feels jittery and agitated, poor appetite. She also said she is tired and sleepy. She has tried taking AM and PM and neither make a difference in how she feels. She has no motivation. She can't specify if there was a trigger. In your last note she was pleased with Prozac. She is getting to sleep but can't stay asleep, "once I'm up, I'm up). She states getting 3-4 hours of sleep a night. Denies suicidal ideation.  She said she doesn't have any caffeine intake. She was tearful at the end of the conversation.  ?

## 2021-11-17 NOTE — Telephone Encounter (Signed)
Is she taking the '10mg'$ ? With food? We have been through this several times with Prozac. If she does not tolerate it, she can leave it off.

## 2021-11-17 NOTE — Telephone Encounter (Signed)
Next visit is 01/25/22. Tracy Malone called stating that she has tried Prozac in the PM and AM and it is not working for her. She states she is jittery, a lot of anxiety and doesn't feel good taking it both AM and PM. Please call her at [phone]. ?

## 2021-11-18 NOTE — Telephone Encounter (Signed)
Please call patient to schedule an earlier appt, either late May (I know Barnett Applebaum is off last week of May) or first of June. Keep current appt.  ?

## 2021-11-21 NOTE — Telephone Encounter (Signed)
Appt made 5/25 ?

## 2021-11-22 ENCOUNTER — Ambulatory Visit: Payer: Medicare Other | Attending: Sports Medicine

## 2021-11-22 DIAGNOSIS — M5459 Other low back pain: Secondary | ICD-10-CM

## 2021-11-22 DIAGNOSIS — M25552 Pain in left hip: Secondary | ICD-10-CM | POA: Diagnosis not present

## 2021-11-22 DIAGNOSIS — M6281 Muscle weakness (generalized): Secondary | ICD-10-CM | POA: Diagnosis not present

## 2021-11-22 NOTE — Therapy (Signed)
?OUTPATIENT PHYSICAL THERAPY TREATMENT NOTE ? ? ?Patient Name: Tracy Malone ?MRN: [id number] ?DOB:10/29/1931, 88 y.o., female ?Today's Date: 11/22/2021 ? ?PCP: [name], MD ?REFERRING PROVIDER: [name], MD ? ?END OF SESSION:  ? PT End of Session - 11/22/21 1318   ? ? Visit Number 5   ? Date for PT Re-Evaluation 12/22/21   ? Authorization Type Medicare   ? Progress Note Due on Visit 10   ? PT Start Time 1232   ? PT Stop Time 1318   ? PT Time Calculation (min) 46 min   ? Activity Tolerance Patient tolerated treatment well   ? Behavior During Therapy [facility] for tasks assessed/performed   ? ?  ?  ? ?  ? ? ? ? ? ?Past Medical History:  ?Diagnosis Date  ? Bell's palsy 1966  ? Hx: right side facial droop, resolved per patient 04/02/19  ? Carotid artery disease ([place]) 2010  ? on vascular screening;unchanged 2013.(could not tolerate simvastatin, no other statins tried)--<30% blockage bilat 07/2011  ? Chronic abdominal pain   ? Chronic fatigue and malaise   ? Claustrophobia   ? Cyst of left ovary   ? last imaging 06/2021, benign, no further f/u  ? Depression   ? treated in the past for years;stopped in 2010 for a years  ? Duodenal ulcer 1962  ? h/o  ? Dysrhythmia   ? ocassional PVC's, no current problems per patient on 04/02/19  ? Fibromyalgia   ? Frequent PVCs 07/2012  ? Seen by [facility]: benign, asymptomatic, normal EF  ? Gallstones 02/2021  ? on [place]  ? GERD (gastroesophageal reflux disease)   ? diet controlled  ? Glaucoma, narrow-angle   ? s/p laser surgery  ? History of hiatal hernia   ? during endoscopy  ? Hypothyroid 03/2007  ? IBS (irritable bowel syndrome)   ? Dr. Benson Norway  ? Ischemic colitis ([place]) 11/21/2018  ? no current problems per patient on 04/02/19  ? Lichenoid keratosis 16/07/930  ? Dr.Stinehelfer  ? Ocular migraine   ? Osteoporosis 04/2010  ? Dr.Hawkes; later consulted Dr. Buddy Duty (2022)  ? Panic attack   ? Parkinson disease ([place])   ? Parkinson's disease ([place]) 06/23/2016  ? Recurrent UTI   ? has cystocele-Dr.Grewal  ?  Shingles 1999  ? h/o  ? Superficial thrombophlebitis 03/2009  ? RLE  ? Trochanteric bursitis 12/2008  ? bilateral  ? ?Past Surgical History:  ?Procedure Laterality Date  ? ABDOMINAL HYSTERECTOMY    ? BACK SURGERY  05/2019  ? CATARACT EXTRACTION, BILATERAL  1995, 1996  ? EYE SURGERY Bilateral   ? laser - glaucoma  ? Flexible sigmoidoscopy    ? KYPHOPLASTY N/A 04/03/2019  ? Procedure: KYPHOPLASTY L1;  Surgeon: Melina Schools, MD;  Location: [place];  Service: Orthopedics;  Laterality: N/A;  90 mins  ? THYROIDECTOMY, PARTIAL  09/2005  ? L nodule; Dr. Harlow Asa  ? TONSILLECTOMY  1946  ? UPPER GI ENDOSCOPY  06/27/2012  ? VAGINAL HYSTERECTOMY  1971  ? and bladder repair.  Still has ovaries  ? WISDOM TOOTH EXTRACTION    ? ?Patient Active Problem List  ? Diagnosis Date Noted  ? Senile purpura ([place]) 11/04/2021  ? Calculus of gallbladder without cholecystitis without obstruction 11/01/2021  ? Rash 03/18/2021  ? Herpes zoster without complication 35/57/3220  ? Multiple allergies 03/18/2021  ? Frail elderly 03/18/2021  ? LUQ pain 10/29/2019  ? Poor appetite 10/29/2019  ? Loss of weight 10/29/2019  ? Nausea  and vomiting 10/29/2019  ? Altered bowel habits 10/29/2019  ? Lumbar compression fracture ([place]) 04/03/2019  ? Compression fracture of L1 lumbar vertebra ([place]) 03/16/2019  ? Osteoporosis of multiple sites 03/16/2019  ? Essential tremor 02/06/2019  ? Ischemic colitis ([place])   ? Leukocytosis   ? Acute colitis 11/22/2018  ? Colitis 11/22/2018  ? Acute lower UTI 11/22/2018  ? Acute cystitis without hematuria   ? Parkinson's disease ([place]) 10/24/2018  ? Gait disturbance 11/28/2017  ? Chronic lymphocytic thyroiditis 04/24/2017  ? Status post removal of thyroid nodule 04/24/2017  ? Depression with anxiety 03/16/2017  ? Subcutaneous nodules 03/16/2017  ? Aortic atherosclerosis ([place]) 11/24/2016  ? Allergy to multiple antibiotics 10/07/2016  ? Seafood allergy, anaphylaxis, subsequent encounter 10/07/2016  ? Helicobacter pylori gastritis  10/07/2016  ? Fall 09/05/2016  ? Bloating 08/15/2016  ? Severe recurrent major depression without psychotic features ([place]) 04/13/2016  ?  Class: Chronic  ? Rash and nonspecific skin eruption 03/01/2016  ? Leg pain, bilateral 03/01/2016  ? Functional dyspepsia 10/13/2015  ? Subacromial bursitis 03/02/2015  ? Resting tremor 02/01/2015  ? Epigastric fullness 10/12/2014  ? Early satiety 10/12/2014  ? Cystocele 12/30/2013  ? IBS (irritable bowel syndrome) 09/30/2013  ? GERD (gastroesophageal reflux disease) 06/27/2013  ? PVC's (premature ventricular contractions) 10/02/2012  ? Depressive disorder, not elsewhere classified 10/02/2012  ? Bradycardia 08/01/2012  ? Fatigue 08/01/2012  ? Anxiety state 05/13/2012  ? Osteopenia 05/13/2012  ? Hypothyroidism 05/13/2012  ? ? ?REFERRING DIAG:  ?M54.42,G89.29 (ICD-10-CM) - Chronic left-sided low back pain with left-sided sciatica  ?M51.36 (ICD-10-CM) - DDD (degenerative disc disease), lumbar  ?M16.12 (ICD-10-CM) - Primary osteoarthritis of left hip  ? ? ?THERAPY DIAG:  ?Other low back pain ? ?Muscle weakness (generalized) ? ?Pain in left hip ? ?PERTINENT HISTORY: Parkinson's disease; L1 compression fracture 2020; osteoporosis; had nerve blocks in past and fell on left side; ?Osteoporosis;  fibromyalgia;  married 20 years; negative experience with previous PT ? ?PRECAUTIONS: osteoporosis  ? ?SUBJECTIVE: I had one day without pain.  Now the pain fluctuates to the Rt and to the left.  ? ?PAIN:  ?Are you having pain? Yes: NPRS scale: 7/10 ?Pain location: Lt back and down the leg ?Pain description: sore, painful ?Aggravating factors: bending, stretching, lifting  ?Relieving factors: ice ? ?OBJECTIVE: from evaluation on 10/27/21 ?  ?DIAGNOSTIC FINDINGS:  ?Not recently ?  ?PATIENT SURVEYS:  ?FOTO 45%       ?           ?SENSATION: ?WFL to light touch ?   ?POSTURE:  ?Decreased lumbar lordosis, increased thoracic kyphosis; right shoulder elevated in sitting and standing with slight right shift;   hand tremor ?  ?PALPATION: ?Tender points in bil gluteals and lumbar paraspinals;  decreased thoraco lumbar fascial mobility ?  ?LUMBAR ROM:  Uses partial squat to pick up items from floor;  unable to tolerate supine lying but OK with right sidelying  ?  ?Active  A/PROM  ?10/27/21  ?Flexion Not tested secondary to osteoporosis  ?Extension 0  ?Right lateral flexion    ?Left lateral flexion    ?Right rotation    ?Left rotation    ? (Blank rows = not tested) ?Trunk strength: decreased trunk extensor strength 4/5 and decreased activation of lower abdominals  ?LE ROM:  WFLs, right and left knee pain with full extension ?LE MMT:  Able to rise from standard chair without UE assist  ?  ?LUMBAR SPECIAL TESTS:  ?Slump test: Negative ?  ?  GAIT: ?Distance walked: 80 feet ?Assistive device utilized: Single point cane ?Level of assistance: Complete Independence ?Comments: shorter step length  ?  ? ?TODAY'S TREATMENT  ?Treatment on date: 11/22/21 ?  ?Seated marching: 2x10 ?Long arc quads: 2x10 ?Sidelying quadratus stretch x 5 ?Seated quadratus stretch-gentle x 5 ?NuStep: level 1 x 5 minutes-PT present to discuss progress ?Seated hamstring stretch 2x20 seconds  ?Discussed aquatic PT today and pt is going to try this ?Trigger Point Dry-Needling  ?Treatment instructions: Expect mild to moderate muscle soreness. S/S of pneumothorax if dry needled over a lung field, and to seek immediate medical attention should they occur. Patient verbalized understanding of these instructions and education. ?  ?Patient Consent Given: Yes ?Education handout provided: Yes ?Muscles treated: Rt and Lt gluteals ?Treatment response/outcome: twitch response and improved mobility  ?  ?Skilled palpation and monitoring by PT during dry needling  ?Elongation and release after needling to Lt>Rt gluteals ?  ?Treatment on date: 11/16/21 ?  ?Seated ball squeeze: 5" second squeeze x10 ?Hip abduction with yellow band: x20 ?Seated hamstring stretch 2x20 seconds  ?Trigger  Point Dry-Needling  ?Treatment instructions: Expect mild to moderate muscle soreness. S/S of pneumothorax if dry needled over a lung field, and to seek immediate medical attention should they occur. [place]

## 2021-11-22 NOTE — Patient Instructions (Signed)
? ? ? ?  [facility] ?Welcome to [facility]! Here you will find all the information you will need regarding your pool therapy. If you have further questions at any time, please call our office at [phone]. ?After completing your initial evaluation in the [place] clinic, you may be eligible to complete a portion of your therapy in the pool. A typical week of therapy will consist of 1-2 typical physical therapy visits at our [place] location and an additional session of therapy in the pool located at the [facility]. [address], [place]. The phone number at the pool site is [phone]. Please call this number if you are running late or need to cancel your appointment.  ?Aquatic therapy will be offered on Wednesday mornings and Friday afternoons. Each session will last approximately 45 minutes. All scheduling and payments for aquatic therapy sessions, including cancelations, will be done through our [place] location.  ?To be eligible for aquatic therapy, these criteria must be met: ?You must be able to independently change in the locker room and get to the pool deck. A caregiver can come with you to help if needed. There are benches for a caregiver to sit on next to the pool. ?No one with an open wound is permitted in the pool.  ?Handicap parking is available in the front and there is a drop off option for even closer accessibility. ?Please arrive 15 minutes prior to your appointment to prepare for your pool session. You must sign in at the front desk upon your arrival. ?Please be sure to attend to any toileting needs prior to entering the pool. ?[place] rooms for changing are available.  There is direct access to the pool deck from the locker room. You can lock your belongings in a locker or bring them with you poolside. ?Your therapist will greet you on the pool deck. There may be other swimmers in the pool at the  same time but your session is one-on-one with the therapist. ?  ?

## 2021-11-23 ENCOUNTER — Ambulatory Visit (INDEPENDENT_AMBULATORY_CARE_PROVIDER_SITE_OTHER): Payer: Medicare Other | Admitting: Psychiatry

## 2021-11-23 DIAGNOSIS — F411 Generalized anxiety disorder: Secondary | ICD-10-CM

## 2021-11-23 NOTE — Progress Notes (Signed)
?    [facility] Counselor/Therapist Progress Note ? ?Patient ID: Tracy Malone, MRN: [id number],   ? ?Date: 11/23/2021 ? ?Time Spent: 48 minutes  ? ?Treatment Type: Individual Therapy ? ?Reported Symptoms: anxiety, depression ? ?Mental Status Exam: ? ?Appearance:   Neat     ?Behavior:  Appropriate, Sharing, and Motivated  ?Motor:  Walking with cane  ?Speech/Language:   Clear and Coherent  ?Affect:  Depressed, Tearful, and anxious  ?Mood:  anxious and depressed  ?Thought process:  goal directed  ?Thought content:    Some obsessiveness and overthinking  ?Sensory/Perceptual disturbances:    WNL  ?Orientation:  oriented to person, place, time/date, situation, day of week, month of year, year, and stated date of Nov 23, 2021  ?Attention:  Fair  ?Concentration:  Fair  ?Memory:  WNL  ?Fund of knowledge:   Good  ?Insight:    Good and Fair  ?Judgment:   Good  ?Impulse Control:  Good  ? ?Risk Assessment: ?Danger to Self:  No ?Self-injurious Behavior: No ?Danger to Others: No ?Duty to Warn:no ?Physical Aggression / Violence:No  ?Access to Firearms a concern: No  ?Gang Involvement:No  ? ?Subjective: Patient in today reporting depression and anxiety related mostly to ongoing health concerns and husband's health and aging. Tearfully processed some of her thoughts and feelings and was able to eventually realize "having a bad day today but also have some better days."Denies any SI. Discussed what contributes to a "good day" and what contributes to a bad day and patient was actively involved in this as she stated she "needed to remember this at the start of each day".  Also shared some concern she had about her husband's health.  Reviewed some points in her treatment goal plan with her especially in the strategy section regarding negative self talk and worked with patient on decreasing her negative self talk and shared how that negatively impacts herself by feeding the depression she is already feeling.  Also reviewed some  suggested behaviors from another session that involved getting out of her house more and seeing neighbors or going to nearby [employer] to browze and possibly check out books which she expressed interest in doing.  Did follow up with her doctor's referral for PT and has recently received some dry needling treatments that she patient stated "helped a little for my back pain".  Patient is encouraged to continue trying not to "narrow her focus so much in a negative direction" and instead try to be open to seeing more positives versus negatives.  She can do better working on this in the office but real life situations outside the office can understandably be more difficult. ? ?Interventions: Solution-Oriented/Positive Psychology and Ego-Supportive ? ?Treatment Goal Plan:  ?Patient not signing treatment plan on computer screen due to [place]. ?Treatment Goals: ?Treatment goals will remain on treatment plan as patient works with strategies to achieve her goals.  Progress will be noted each session and documented in the "progress" section of note. ?Long term goal: ?Develop the ability to recognize, accept, and cope with feelings of anxiety and depression. ?Short term goal: ?Verbalize any unresolved grief issues that may be contributing to anxiety and depression. ?Strategy: ?Replace negative self-defeating self talk with verbalization of realistic and positive cognitive messages to lessen depression/anxiety and improve mood. ? ?Diagnosis: ?  ICD-10-CM   ?1. Generalized anxiety disorder  F41.1   ?  ? ?Plan: Patient today showing good participation and motivation as we worked in session on her  tendency to look for the negatives versus the positives, her depression and anxiety, and also combating loneliness at times.  She was actively involved in discussing each of these areas and states that it is helpful to discuss strategies to use during these times but often very hard to follow through on "in the moment especially when  some things happen unexpectedly".  Encouraged her being more actively involved with others especially in their community with other neighbors close by and to consider activities of going to [employer] that she has previously enjoyed, and also following up on her desire to return to services at [employer] as she is able. Encouraged patient in her practice of more positive behaviors including positive self-care such as: Consider doing more activities that she enjoys such as reading and talking with friends on the phone, listening to music which she finds soothing, walking some as she is able, getting books from the nearby [employer], not overly focusing on negatives nor assuming worst case scenarios, remaining on her medications as prescribed, moving about carefully for any exercise, allow for good sleep patterns including not sleeping too much during the day, staying in the present and focused on what she can control or change, remaining in contact with family and friends who are supportive, and recognize the strength she shows working with goal directed behaviors to move in a direction that supports her improved emotional health and overall outlook. ? ?Goal review and progress/challenges noted with patient. ? ?Next appointment within 3 to 4 weeks. ? ?This record has been created using [employer].  Chart creation errors have been sought, but may not always have been located and corrected.  Such creation errors do not reflect on the standard of medical care provided. ? ? ?Shanon Ace, LCSW ? ? ? ? ? ? ? ? ? ? ? ? ? ? ? ? ? ? ?

## 2021-11-24 ENCOUNTER — Ambulatory Visit: Payer: Medicare Other

## 2021-11-24 DIAGNOSIS — M25552 Pain in left hip: Secondary | ICD-10-CM | POA: Diagnosis not present

## 2021-11-24 DIAGNOSIS — M5459 Other low back pain: Secondary | ICD-10-CM

## 2021-11-24 DIAGNOSIS — M6281 Muscle weakness (generalized): Secondary | ICD-10-CM

## 2021-11-24 NOTE — Therapy (Signed)
?OUTPATIENT PHYSICAL THERAPY TREATMENT NOTE ? ? ?Patient Name: Tracy Malone ?MRN: [id number] ?DOB:1932-07-13, 88 y.o., female ?Today's Date: 11/24/2021 ? ?PCP: [name], MD ?REFERRING PROVIDER: [name], MD ? ?END OF SESSION:  ? PT End of Session - 11/24/21 1225   ? ? Visit Number 6   ? Date for PT Re-Evaluation 12/22/21   ? Authorization Type Medicare   ? Progress Note Due on Visit 10   ? PT Start Time 1147   ? PT Stop Time 1226   ? PT Time Calculation (min) 39 min   ? Activity Tolerance Patient tolerated treatment well   ? Behavior During Therapy [facility] for tasks assessed/performed   ? ?  ?  ? ?  ? ? ? ? ? ? ?Past Medical History:  ?Diagnosis Date  ? Bell's palsy 1966  ? Hx: right side facial droop, resolved per patient 04/02/19  ? Carotid artery disease ([place]) 2010  ? on vascular screening;unchanged 2013.(could not tolerate simvastatin, no other statins tried)--<30% blockage bilat 07/2011  ? Chronic abdominal pain   ? Chronic fatigue and malaise   ? Claustrophobia   ? Cyst of left ovary   ? last imaging 06/2021, benign, no further f/u  ? Depression   ? treated in the past for years;stopped in 2010 for a years  ? Duodenal ulcer 1962  ? h/o  ? Dysrhythmia   ? ocassional PVC's, no current problems per patient on 04/02/19  ? Fibromyalgia   ? Frequent PVCs 07/2012  ? Seen by [facility]: benign, asymptomatic, normal EF  ? Gallstones 02/2021  ? on [place]  ? GERD (gastroesophageal reflux disease)   ? diet controlled  ? Glaucoma, narrow-angle   ? s/p laser surgery  ? History of hiatal hernia   ? during endoscopy  ? Hypothyroid 03/2007  ? IBS (irritable bowel syndrome)   ? Dr. Benson Norway  ? Ischemic colitis ([place]) 11/21/2018  ? no current problems per patient on 04/02/19  ? Lichenoid keratosis 84/69/6295  ? Dr.Stinehelfer  ? Ocular migraine   ? Osteoporosis 04/2010  ? Dr.Hawkes; later consulted Dr. Buddy Duty (2022)  ? Panic attack   ? Parkinson disease ([place])   ? Parkinson's disease ([place]) 06/23/2016  ? Recurrent UTI   ? has cystocele-Dr.Grewal   ? Shingles 1999  ? h/o  ? Superficial thrombophlebitis 03/2009  ? RLE  ? Trochanteric bursitis 12/2008  ? bilateral  ? ?Past Surgical History:  ?Procedure Laterality Date  ? ABDOMINAL HYSTERECTOMY    ? BACK SURGERY  05/2019  ? CATARACT EXTRACTION, BILATERAL  1995, 1996  ? EYE SURGERY Bilateral   ? laser - glaucoma  ? Flexible sigmoidoscopy    ? KYPHOPLASTY N/A 04/03/2019  ? Procedure: KYPHOPLASTY L1;  Surgeon: Melina Schools, MD;  Location: [facility];  Service: Orthopedics;  Laterality: N/A;  90 mins  ? THYROIDECTOMY, PARTIAL  09/2005  ? L nodule; Dr. Harlow Asa  ? TONSILLECTOMY  1946  ? UPPER GI ENDOSCOPY  06/27/2012  ? VAGINAL HYSTERECTOMY  1971  ? and bladder repair.  Still has ovaries  ? WISDOM TOOTH EXTRACTION    ? ?Patient Active Problem List  ? Diagnosis Date Noted  ? Senile purpura ([place]) 11/04/2021  ? Calculus of gallbladder without cholecystitis without obstruction 11/01/2021  ? Rash 03/18/2021  ? Herpes zoster without complication 28/41/3244  ? Multiple allergies 03/18/2021  ? Frail elderly 03/18/2021  ? LUQ pain 10/29/2019  ? Poor appetite 10/29/2019  ? Loss of weight 10/29/2019  ?  Nausea and vomiting 10/29/2019  ? Altered bowel habits 10/29/2019  ? Lumbar compression fracture ([place]) 04/03/2019  ? Compression fracture of L1 lumbar vertebra ([place]) 03/16/2019  ? Osteoporosis of multiple sites 03/16/2019  ? Essential tremor 02/06/2019  ? Ischemic colitis ([place])   ? Leukocytosis   ? Acute colitis 11/22/2018  ? Colitis 11/22/2018  ? Acute lower UTI 11/22/2018  ? Acute cystitis without hematuria   ? Parkinson's disease ([place]) 10/24/2018  ? Gait disturbance 11/28/2017  ? Chronic lymphocytic thyroiditis 04/24/2017  ? Status post removal of thyroid nodule 04/24/2017  ? Depression with anxiety 03/16/2017  ? Subcutaneous nodules 03/16/2017  ? Aortic atherosclerosis ([place]) 11/24/2016  ? Allergy to multiple antibiotics 10/07/2016  ? Seafood allergy, anaphylaxis, subsequent encounter 10/07/2016  ? Helicobacter pylori gastritis  10/07/2016  ? Fall 09/05/2016  ? Bloating 08/15/2016  ? Severe recurrent major depression without psychotic features ([place]) 04/13/2016  ?  Class: Chronic  ? Rash and nonspecific skin eruption 03/01/2016  ? Leg pain, bilateral 03/01/2016  ? Functional dyspepsia 10/13/2015  ? Subacromial bursitis 03/02/2015  ? Resting tremor 02/01/2015  ? Epigastric fullness 10/12/2014  ? Early satiety 10/12/2014  ? Cystocele 12/30/2013  ? IBS (irritable bowel syndrome) 09/30/2013  ? GERD (gastroesophageal reflux disease) 06/27/2013  ? PVC's (premature ventricular contractions) 10/02/2012  ? Depressive disorder, not elsewhere classified 10/02/2012  ? Bradycardia 08/01/2012  ? Fatigue 08/01/2012  ? Anxiety state 05/13/2012  ? Osteopenia 05/13/2012  ? Hypothyroidism 05/13/2012  ? ? ?REFERRING DIAG:  ?M54.42,G89.29 (ICD-10-CM) - Chronic left-sided low back pain with left-sided sciatica  ?M51.36 (ICD-10-CM) - DDD (degenerative disc disease), lumbar  ?M16.12 (ICD-10-CM) - Primary osteoarthritis of left hip  ? ? ?THERAPY DIAG:  ?Other low back pain ? ?Muscle weakness (generalized) ? ?Pain in left hip ? ?PERTINENT HISTORY: Parkinson's disease; L1 compression fracture 2020; osteoporosis; had nerve blocks in past and fell on left side; ?Osteoporosis;  fibromyalgia;  married 61 years; negative experience with previous PT ? ?PRECAUTIONS: osteoporosis  ? ?SUBJECTIVE: I had to lay down after I was here last time. ? ?PAIN:  ?Are you having pain? Yes: NPRS scale: 7/10 ?Pain location: Lt back and down the leg ?Pain description: sore, painful ?Aggravating factors: bending, stretching, lifting  ?Relieving factors: ice ? ?OBJECTIVE: from evaluation on 10/27/21 ?  ?DIAGNOSTIC FINDINGS:  ?Not recently ?  ?PATIENT SURVEYS:  ?FOTO 45%       ?           ?SENSATION: ?WFL to light touch ?   ?POSTURE:  ?Decreased lumbar lordosis, increased thoracic kyphosis; right shoulder elevated in sitting and standing with slight right shift;  hand tremor ?   ?PALPATION: ?Tender points in bil gluteals and lumbar paraspinals;  decreased thoraco lumbar fascial mobility ?  ?LUMBAR ROM:  Uses partial squat to pick up items from floor;  unable to tolerate supine lying but OK with right sidelying  ?  ?Active  A/PROM  ?10/27/21  ?Flexion Not tested secondary to osteoporosis  ?Extension 0  ?Right lateral flexion    ?Left lateral flexion    ?Right rotation    ?Left rotation    ? (Blank rows = not tested) ?Trunk strength: decreased trunk extensor strength 4/5 and decreased activation of lower abdominals  ?LE ROM:  WFLs, right and left knee pain with full extension ?LE MMT:  Able to rise from standard chair without UE assist  ?  ?LUMBAR SPECIAL TESTS:  ?Slump test: Negative ?  ?GAIT: ?Distance walked: 80 feet ?  Assistive device utilized: Single point cane ?Level of assistance: Complete Independence ?Comments: shorter step length  ?  ? ?TODAY'S TREATMENT  ?Treatment on date: 11/24/21 ?Seated marching: 2x10 ?Long arc quads: 2x10- 5" hold ?Sidelying quadratus stretch x 5 ?Seated quadratus stretch-gentle x 5 ?NuStep: level 1 x 5 minutes-PT present to discuss progress ?Seated hamstring stretch 2x20 seconds  ?Manual: elongation and release to Lt gluteals and quadratus ? ?Treatment on date: 11/22/21 ?  ?Seated marching: 2x10 ?Long arc quads: 2x10 ?Ball squeeze 5" hold 2x10 ?Seated quadratus stretch-gentle x 5 ?Seated hamstring stretch 2x20 seconds  ?Discussed aquatic PT today and pt is going to try this ?Trigger Point Dry-Needling  ?Treatment instructions: Expect mild to moderate muscle soreness. S/S of pneumothorax if dry needled over a lung field, and to seek immediate medical attention should they occur. Patient verbalized understanding of these instructions and education. ?  ?Patient Consent Given: Yes ?Education handout provided: Yes ?Muscles treated: Rt and Lt gluteals ?Treatment response/outcome: twitch response and improved mobility  ?  ?Skilled palpation and monitoring by PT during dry  needling  ?Elongation and release after needling to Lt>Rt gluteals ?  ?Treatment on date: 11/16/21 ?  ?Seated ball squeeze: 5" second squeeze x10 ?Hip abduction with yellow band: x20 ?Seated hamstring stretch 2x20 seconds

## 2021-11-28 ENCOUNTER — Ambulatory Visit: Payer: Medicare Other

## 2021-11-28 DIAGNOSIS — M6281 Muscle weakness (generalized): Secondary | ICD-10-CM

## 2021-11-28 DIAGNOSIS — M5459 Other low back pain: Secondary | ICD-10-CM | POA: Diagnosis not present

## 2021-11-28 DIAGNOSIS — M25552 Pain in left hip: Secondary | ICD-10-CM | POA: Diagnosis not present

## 2021-11-28 NOTE — Therapy (Signed)
?OUTPATIENT PHYSICAL THERAPY TREATMENT NOTE ? ? ?Patient Name: Tracy Malone ?MRN: [id number] ?DOB:[birth year], 88 y.o., female ?Today's Date: 11/28/2021 ? ?PCP: [name], MD ?REFERRING PROVIDER: Glennon Mac, DO ? ?END OF SESSION:  ? PT End of Session - 11/28/21 1227   ? ? Visit Number 7   ? Date for PT Re-Evaluation 12/22/21   ? Authorization Type Medicare   ? Progress Note Due on Visit 10   ? PT Start Time 1146   ? PT Stop Time 1228   ? PT Time Calculation (min) 42 min   ? Activity Tolerance Patient tolerated treatment well   ? Behavior During Therapy [facility] for tasks assessed/performed   ? ?  ?  ? ?  ? ? ? ? ? ? ? ?Past Medical History:  ?Diagnosis Date  ? Bell's palsy 1966  ? Hx: right side facial droop, resolved per patient 04/02/19  ? Carotid artery disease ([place]) 2010  ? on vascular screening;unchanged 2013.(could not tolerate simvastatin, no other statins tried)--<30% blockage bilat 07/2011  ? Chronic abdominal pain   ? Chronic fatigue and malaise   ? Claustrophobia   ? Cyst of left ovary   ? last imaging 06/2021, benign, no further f/u  ? Depression   ? treated in the past for years;stopped in 2010 for a years  ? Duodenal ulcer 1962  ? h/o  ? Dysrhythmia   ? ocassional PVC's, no current problems per patient on 04/02/19  ? Fibromyalgia   ? Frequent PVCs 07/2012  ? Seen by [facility]: benign, asymptomatic, normal EF  ? Gallstones 02/2021  ? on [place]  ? GERD (gastroesophageal reflux disease)   ? diet controlled  ? Glaucoma, narrow-angle   ? s/p laser surgery  ? History of hiatal hernia   ? during endoscopy  ? Hypothyroid 03/2007  ? IBS (irritable bowel syndrome)   ? Dr. Benson Norway  ? Ischemic colitis ([place]) 11/21/2018  ? no current problems per patient on 04/02/19  ? Lichenoid keratosis 70/62/3762  ? Dr.Stinehelfer  ? Ocular migraine   ? Osteoporosis 04/2010  ? Dr.Hawkes; later consulted Dr. Buddy Duty (2022)  ? Panic attack   ? Parkinson disease ([place])   ? Parkinson's disease ([place]) 06/23/2016  ? Recurrent UTI   ? has  cystocele-Dr.Grewal  ? Shingles 1999  ? h/o  ? Superficial thrombophlebitis 03/2009  ? RLE  ? Trochanteric bursitis 12/2008  ? bilateral  ? ?Past Surgical History:  ?Procedure Laterality Date  ? ABDOMINAL HYSTERECTOMY    ? BACK SURGERY  05/2019  ? CATARACT EXTRACTION, BILATERAL  1995, 1996  ? EYE SURGERY Bilateral   ? laser - glaucoma  ? Flexible sigmoidoscopy    ? KYPHOPLASTY N/A 04/03/2019  ? Procedure: KYPHOPLASTY L1;  Surgeon: Melina Schools, MD;  Location: [place];  Service: Orthopedics;  Laterality: N/A;  90 mins  ? THYROIDECTOMY, PARTIAL  09/2005  ? L nodule; Dr. Harlow Asa  ? TONSILLECTOMY  1946  ? UPPER GI ENDOSCOPY  06/27/2012  ? VAGINAL HYSTERECTOMY  1971  ? and bladder repair.  Still has ovaries  ? WISDOM TOOTH EXTRACTION    ? ?Patient Active Problem List  ? Diagnosis Date Noted  ? Senile purpura ([place]) 11/04/2021  ? Calculus of gallbladder without cholecystitis without obstruction 11/01/2021  ? Rash 03/18/2021  ? Herpes zoster without complication 83/15/1761  ? Multiple allergies 03/18/2021  ? Frail elderly 03/18/2021  ? LUQ pain 10/29/2019  ? Poor appetite 10/29/2019  ? Loss of weight 10/29/2019  ?  Nausea and vomiting 10/29/2019  ? Altered bowel habits 10/29/2019  ? Lumbar compression fracture ([place]) 04/03/2019  ? Compression fracture of L1 lumbar vertebra ([place]) 03/16/2019  ? Osteoporosis of multiple sites 03/16/2019  ? Essential tremor 02/06/2019  ? Ischemic colitis ([place])   ? Leukocytosis   ? Acute colitis 11/22/2018  ? Colitis 11/22/2018  ? Acute lower UTI 11/22/2018  ? Acute cystitis without hematuria   ? Parkinson's disease ([place]) 10/24/2018  ? Gait disturbance 11/28/2017  ? Chronic lymphocytic thyroiditis 04/24/2017  ? Status post removal of thyroid nodule 04/24/2017  ? Depression with anxiety 03/16/2017  ? Subcutaneous nodules 03/16/2017  ? Aortic atherosclerosis ([place]) 11/24/2016  ? Allergy to multiple antibiotics 10/07/2016  ? Seafood allergy, anaphylaxis, subsequent encounter 10/07/2016  ? Helicobacter  pylori gastritis 10/07/2016  ? Fall 09/05/2016  ? Bloating 08/15/2016  ? Severe recurrent major depression without psychotic features ([place]) 04/13/2016  ?  Class: Chronic  ? Rash and nonspecific skin eruption 03/01/2016  ? Leg pain, bilateral 03/01/2016  ? Functional dyspepsia 10/13/2015  ? Subacromial bursitis 03/02/2015  ? Resting tremor 02/01/2015  ? Epigastric fullness 10/12/2014  ? Early satiety 10/12/2014  ? Cystocele 12/30/2013  ? IBS (irritable bowel syndrome) 09/30/2013  ? GERD (gastroesophageal reflux disease) 06/27/2013  ? PVC's (premature ventricular contractions) 10/02/2012  ? Depressive disorder, not elsewhere classified 10/02/2012  ? Bradycardia 08/01/2012  ? Fatigue 08/01/2012  ? Anxiety state 05/13/2012  ? Osteopenia 05/13/2012  ? Hypothyroidism 05/13/2012  ? ? ?REFERRING DIAG:  ?M54.42,G89.29 (ICD-10-CM) - Chronic left-sided low back pain with left-sided sciatica  ?M51.36 (ICD-10-CM) - DDD (degenerative disc disease), lumbar  ?M16.12 (ICD-10-CM) - Primary osteoarthritis of left hip  ? ? ?THERAPY DIAG:  ?Other low back pain ? ?Muscle weakness (generalized) ? ?Pain in left hip ? ?PERTINENT HISTORY: Parkinson's disease; L1 compression fracture 2020; osteoporosis; had nerve blocks in past and fell on left side; ?Osteoporosis;  fibromyalgia;  married 98 years; negative experience with previous PT ? ?PRECAUTIONS: osteoporosis  ? ?SUBJECTIVE:I was sore for 2 days after being here last time.  I'm just so tired.  I feel like I need to walk more but I don't want to walk on the road.   ? ?PAIN:  ?Are you having pain? Yes: NPRS scale: 5/10 ?Pain location: Lt back and down the leg ?Pain description: sore, painful ?Aggravating factors: bending, stretching, lifting  ?Relieving factors: ice ? ?OBJECTIVE: from evaluation on 10/27/21 ?  ?DIAGNOSTIC FINDINGS:  ?Not recently ?  ?PATIENT SURVEYS:  ?FOTO 45%       ?           ?SENSATION: ?WFL to light touch ?   ?POSTURE:  ?Decreased lumbar lordosis, increased thoracic  kyphosis; right shoulder elevated in sitting and standing with slight right shift;  hand tremor ?  ?PALPATION: ?Tender points in bil gluteals and lumbar paraspinals;  decreased thoraco lumbar fascial mobility ?  ?LUMBAR ROM:  Uses partial squat to pick up items from floor;  unable to tolerate supine lying but OK with right sidelying  ?  ?Active  A/PROM  ?10/27/21  ?Flexion Not tested secondary to osteoporosis  ?Extension 0  ?Right lateral flexion    ?Left lateral flexion    ?Right rotation    ?Left rotation    ? (Blank rows = not tested) ?Trunk strength: decreased trunk extensor strength 4/5 and decreased activation of lower abdominals  ?LE ROM:  WFLs, right and left knee pain with full extension ?LE MMT:  Able to  rise from standard chair without UE assist  ?  ?LUMBAR SPECIAL TESTS:  ?Slump test: Negative ?  ?GAIT: ?Distance walked: 80 feet ?Assistive device utilized: Single point cane ?Level of assistance: Complete Independence ?Comments: shorter step length  ?  ? ?TODAY'S TREATMENT  ?Treatment on date: 11/28/21 ?Seated marching: 2x10 ?Long arc quads: 2x10- 5" hold ?Biceps curls and shoulder flexion 1# 2x10 bil each ?NuStep: level 1 x 8  minutes-PT present to discuss progress ?Seated hamstring stretch 2x20 seconds  ?Manual: elongation and release to Lt gluteals and quadratus ? ?Treatment on date: 11/24/21 ?Seated marching: 2x10 ?Long arc quads: 2x10- 5" hold ?Sidelying quadratus stretch x 5 ?Seated quadratus stretch-gentle x 5 ?NuStep: level 1 x 5 minutes-PT present to discuss progress ?Seated hamstring stretch 2x20 seconds  ?Manual: elongation and release to Lt gluteals and quadratus ? ?Treatment on date: 11/22/21 ?  ?Seated marching: 2x10 ?Long arc quads: 2x10 ?Ball squeeze 5" hold 2x10 ?Seated quadratus stretch-gentle x 5 ?Seated hamstring stretch 2x20 seconds  ?Discussed aquatic PT today and pt is going to try this ?Trigger Point Dry-Needling  ?Treatment instructions: Expect mild to moderate muscle soreness. S/S of  pneumothorax if dry needled over a lung field, and to seek immediate medical attention should they occur. Patient verbalized understanding of these instructions and education. ?  ?Patient Consent Given: Yes ?Education

## 2021-11-30 ENCOUNTER — Ambulatory Visit: Payer: Medicare Other | Admitting: Family Medicine

## 2021-12-02 ENCOUNTER — Ambulatory Visit: Payer: Medicare Other | Admitting: Physical Therapy

## 2021-12-02 ENCOUNTER — Encounter: Payer: Self-pay | Admitting: Physical Therapy

## 2021-12-02 DIAGNOSIS — M25552 Pain in left hip: Secondary | ICD-10-CM | POA: Diagnosis not present

## 2021-12-02 DIAGNOSIS — M5459 Other low back pain: Secondary | ICD-10-CM

## 2021-12-02 DIAGNOSIS — M6281 Muscle weakness (generalized): Secondary | ICD-10-CM

## 2021-12-02 NOTE — Therapy (Signed)
?OUTPATIENT PHYSICAL THERAPY TREATMENT NOTE ? ? ?Patient Name: Tracy Malone ?MRN: [id number] ?DOB:1931-08-29, 88 y.o., female ?Today's Date: 12/02/2021 ? ?PCP: [name], MD ?REFERRING PROVIDER: Glennon Mac, DO ? ?END OF SESSION:  ? PT End of Session - 12/02/21 1342   ? ? Visit Number 8   ? Date for PT Re-Evaluation 12/22/21   ? Authorization Type Medicare   ? Progress Note Due on Visit 10   ? PT Start Time 1341   ? PT Stop Time 1425   ? PT Time Calculation (min) 44 min   ? Activity Tolerance Patient tolerated treatment well   ? Behavior During Therapy [facility] for tasks assessed/performed   ? ?  ?  ? ?  ? ? ? ? ? ? ? ? ?Past Medical History:  ?Diagnosis Date  ? Bell's palsy 1966  ? Hx: right side facial droop, resolved per patient 04/02/19  ? Carotid artery disease ([place]) 2010  ? on vascular screening;unchanged 2013.(could not tolerate simvastatin, no other statins tried)--<30% blockage bilat 07/2011  ? Chronic abdominal pain   ? Chronic fatigue and malaise   ? Claustrophobia   ? Cyst of left ovary   ? last imaging 06/2021, benign, no further f/u  ? Depression   ? treated in the past for years;stopped in 2010 for a years  ? Duodenal ulcer 1962  ? h/o  ? Dysrhythmia   ? ocassional PVC's, no current problems per patient on 04/02/19  ? Fibromyalgia   ? Frequent PVCs 07/2012  ? Seen by [facility]: benign, asymptomatic, normal EF  ? Gallstones 02/2021  ? on [place]  ? GERD (gastroesophageal reflux disease)   ? diet controlled  ? Glaucoma, narrow-angle   ? s/p laser surgery  ? History of hiatal hernia   ? during endoscopy  ? Hypothyroid 03/2007  ? IBS (irritable bowel syndrome)   ? Dr. Benson Norway  ? Ischemic colitis ([place]) 11/21/2018  ? no current problems per patient on 04/02/19  ? Lichenoid keratosis 25/95/6387  ? Dr.Stinehelfer  ? Ocular migraine   ? Osteoporosis 04/2010  ? Dr.Hawkes; later consulted Dr. Buddy Duty (2022)  ? Panic attack   ? Parkinson disease ([place])   ? Parkinson's disease ([place]) 06/23/2016  ? Recurrent UTI   ? has  cystocele-Dr.Grewal  ? Shingles 1999  ? h/o  ? Superficial thrombophlebitis 03/2009  ? RLE  ? Trochanteric bursitis 12/2008  ? bilateral  ? ?Past Surgical History:  ?Procedure Laterality Date  ? ABDOMINAL HYSTERECTOMY    ? BACK SURGERY  05/2019  ? CATARACT EXTRACTION, BILATERAL  1995, 1996  ? EYE SURGERY Bilateral   ? laser - glaucoma  ? Flexible sigmoidoscopy    ? KYPHOPLASTY N/A 04/03/2019  ? Procedure: KYPHOPLASTY L1;  Surgeon: Melina Schools, MD;  Location: [place];  Service: Orthopedics;  Laterality: N/A;  90 mins  ? THYROIDECTOMY, PARTIAL  09/2005  ? L nodule; Dr. Harlow Asa  ? TONSILLECTOMY  1946  ? UPPER GI ENDOSCOPY  06/27/2012  ? VAGINAL HYSTERECTOMY  1971  ? and bladder repair.  Still has ovaries  ? WISDOM TOOTH EXTRACTION    ? ?Patient Active Problem List  ? Diagnosis Date Noted  ? Senile purpura ([place]) 11/04/2021  ? Calculus of gallbladder without cholecystitis without obstruction 11/01/2021  ? Rash 03/18/2021  ? Herpes zoster without complication 56/43/3295  ? Multiple allergies 03/18/2021  ? Frail elderly 03/18/2021  ? LUQ pain 10/29/2019  ? Poor appetite 10/29/2019  ? Loss of weight 10/29/2019  ?  Nausea and vomiting 10/29/2019  ? Altered bowel habits 10/29/2019  ? Lumbar compression fracture ([place]) 04/03/2019  ? Compression fracture of L1 lumbar vertebra ([place]) 03/16/2019  ? Osteoporosis of multiple sites 03/16/2019  ? Essential tremor 02/06/2019  ? Ischemic colitis ([place])   ? Leukocytosis   ? Acute colitis 11/22/2018  ? Colitis 11/22/2018  ? Acute lower UTI 11/22/2018  ? Acute cystitis without hematuria   ? Parkinson's disease ([place]) 10/24/2018  ? Gait disturbance 11/28/2017  ? Chronic lymphocytic thyroiditis 04/24/2017  ? Status post removal of thyroid nodule 04/24/2017  ? Depression with anxiety 03/16/2017  ? Subcutaneous nodules 03/16/2017  ? Aortic atherosclerosis ([place]) 11/24/2016  ? Allergy to multiple antibiotics 10/07/2016  ? Seafood allergy, anaphylaxis, subsequent encounter 10/07/2016  ? Helicobacter  pylori gastritis 10/07/2016  ? Fall 09/05/2016  ? Bloating 08/15/2016  ? Severe recurrent major depression without psychotic features ([place]) 04/13/2016  ?  Class: Chronic  ? Rash and nonspecific skin eruption 03/01/2016  ? Leg pain, bilateral 03/01/2016  ? Functional dyspepsia 10/13/2015  ? Subacromial bursitis 03/02/2015  ? Resting tremor 02/01/2015  ? Epigastric fullness 10/12/2014  ? Early satiety 10/12/2014  ? Cystocele 12/30/2013  ? IBS (irritable bowel syndrome) 09/30/2013  ? GERD (gastroesophageal reflux disease) 06/27/2013  ? PVC's (premature ventricular contractions) 10/02/2012  ? Depressive disorder, not elsewhere classified 10/02/2012  ? Bradycardia 08/01/2012  ? Fatigue 08/01/2012  ? Anxiety state 05/13/2012  ? Osteopenia 05/13/2012  ? Hypothyroidism 05/13/2012  ? ? ?REFERRING DIAG:  ?M54.42,G89.29 (ICD-10-CM) - Chronic left-sided low back pain with left-sided sciatica  ?M51.36 (ICD-10-CM) - DDD (degenerative disc disease), lumbar  ?M16.12 (ICD-10-CM) - Primary osteoarthritis of left hip  ? ? ?THERAPY DIAG:  ?Other low back pain ? ?Muscle weakness (generalized) ? ?Pain in left hip ? ?PERTINENT HISTORY: Parkinson's disease; L1 compression fracture 2020; osteoporosis; had nerve blocks in past and fell on left side; ?Osteoporosis;  fibromyalgia;  married 89 years; negative experience with previous PT ? ?PRECAUTIONS: osteoporosis  ? ?SUBJECTIVE: I am in a lot of pain today. Did not sleep very well last night. ? ?PAIN:  ?Are you having pain? Yes: NPRS scale: 8/10 ?Pain location: Lt back  ?Pain description: sore, painful ?Aggravating factors: bending, stretching, lifting  ?Relieving factors: ice ? ?OBJECTIVE: from evaluation on 10/27/21 ?  ?DIAGNOSTIC FINDINGS:  ?Not recently ?  ?PATIENT SURVEYS:  ?FOTO 45%       ?           ?SENSATION: ?WFL to light touch ?   ?POSTURE:  ?Decreased lumbar lordosis, increased thoracic kyphosis; right shoulder elevated in sitting and standing with slight right shift;  hand tremor ?   ?PALPATION: ?Tender points in bil gluteals and lumbar paraspinals;  decreased thoraco lumbar fascial mobility ?  ?LUMBAR ROM:  Uses partial squat to pick up items from floor;  unable to tolerate supine lying but OK with right sidelying  ?  ?Active  A/PROM  ?10/27/21  ?Flexion Not tested secondary to osteoporosis  ?Extension 0  ?Right lateral flexion    ?Left lateral flexion    ?Right rotation    ?Left rotation    ? (Blank rows = not tested) ?Trunk strength: decreased trunk extensor strength 4/5 and decreased activation of lower abdominals  ?LE ROM:  WFLs, right and left knee pain with full extension ?LE MMT:  Able to rise from standard chair without UE assist  ?  ?LUMBAR SPECIAL TESTS:  ?Slump test: Negative ?  ?GAIT: ?Distance walked: 80  feet ?Assistive device utilized: Single point cane ?Level of assistance: Complete Independence ?Comments: shorter step length  ?  ? ?TODAY'S TREATMENT  ?12/02/21 ?Treatment: Pt arrives for aquatic physical therapy. Treatment took place in 3.5-5.5 feet of water. Water temperature was 91 degrees F.. Pt entered the pool via stairs with moderate use of rails. Pt requires buoyancy of water for support and to offload joints with strengthening exercises.   ?Seated water bench with 75% submersion ?Pt performed seated LE AROM exercises 20x in all planes, TC to help pt stay seated. Pain assessment concurrent. ? ?75% depth leaning against the bench initially then progressing to vertical stance: shoulder flex/ext 10x, blue noodle trunk rotation to the RT for gentle lower Lt stretch 2x10: forward walking 4 lengths 2x holding large noodle for back support. Horizontal decompression float with 100% flotation and PTA providing lateral sway to mobilize trunk.  ? ?Treatment on date: 11/28/21 ?Seated marching: 2x10 ?Long arc quads: 2x10- 5" hold ?Biceps curls and shoulder flexion 1# 2x10 bil each ?NuStep: level 1 x 8  minutes-PT present to discuss progress ?Seated hamstring stretch 2x20 seconds  ?Manual:  elongation and release to Lt gluteals and quadratus ? ?Treatment on date: 11/24/21 ?Seated marching: 2x10 ?Long arc quads: 2x10- 5" hold ?Sidelying quadratus stretch x 5 ?Seated quadratus stretch-gentle x 5 ?N

## 2021-12-05 ENCOUNTER — Ambulatory Visit: Payer: Medicare Other

## 2021-12-05 DIAGNOSIS — M5459 Other low back pain: Secondary | ICD-10-CM | POA: Diagnosis not present

## 2021-12-05 DIAGNOSIS — M25552 Pain in left hip: Secondary | ICD-10-CM

## 2021-12-05 DIAGNOSIS — M6281 Muscle weakness (generalized): Secondary | ICD-10-CM

## 2021-12-05 NOTE — Therapy (Signed)
?OUTPATIENT PHYSICAL THERAPY TREATMENT NOTE ? ? ?Patient Name: Tracy Malone ?MRN: [id number] ?DOB:1932/02/01, 88 y.o., female ?Today's Date: 12/05/2021 ? ?PCP: [name], MD ?REFERRING PROVIDER: [name], MD ?Progress Note ?Reporting Period 10/27/21 to 12/05/21 ? ?See note below for Objective Data and Assessment of Progress/Goals.  ? ?  ?END OF SESSION:  ? PT End of Session - 12/05/21 1444   ? ? Visit Number 9   ? Date for PT Re-Evaluation 12/22/21   ? Authorization Type Medicare   ? Progress Note Due on Visit 19   ? PT Start Time 1402   ? PT Stop Time 0277   ? PT Time Calculation (min) 45 min   ? Activity Tolerance Patient tolerated treatment well   ? Behavior During Therapy [facility] for tasks assessed/performed   ? ?  ?  ? ?  ? ? ? ? ? ? ? ? ? ?Past Medical History:  ?Diagnosis Date  ? Bell's palsy 1966  ? Hx: right side facial droop, resolved per patient 04/02/19  ? Carotid artery disease ([place]) 2010  ? on vascular screening;unchanged 2013.(could not tolerate simvastatin, no other statins tried)--<30% blockage bilat 07/2011  ? Chronic abdominal pain   ? Chronic fatigue and malaise   ? Claustrophobia   ? Cyst of left ovary   ? last imaging 06/2021, benign, no further f/u  ? Depression   ? treated in the past for years;stopped in 2010 for a years  ? Duodenal ulcer 1962  ? h/o  ? Dysrhythmia   ? ocassional PVC's, no current problems per patient on 04/02/19  ? Fibromyalgia   ? Frequent PVCs 07/2012  ? Seen by [facility]: benign, asymptomatic, normal EF  ? Gallstones 02/2021  ? on [place]  ? GERD (gastroesophageal reflux disease)   ? diet controlled  ? Glaucoma, narrow-angle   ? s/p laser surgery  ? History of hiatal hernia   ? during endoscopy  ? Hypothyroid 03/2007  ? IBS (irritable bowel syndrome)   ? Dr. Benson Norway  ? Ischemic colitis ([place]) 11/21/2018  ? no current problems per patient on 04/02/19  ? Lichenoid keratosis 41/28/7867  ? Dr.Stinehelfer  ? Ocular migraine   ? Osteoporosis 04/2010  ? Dr.Hawkes; later consulted Dr. Buddy Duty  (2022)  ? Panic attack   ? Parkinson disease ([place])   ? Parkinson's disease ([place]) 06/23/2016  ? Recurrent UTI   ? has cystocele-Dr.Grewal  ? Shingles 1999  ? h/o  ? Superficial thrombophlebitis 03/2009  ? RLE  ? Trochanteric bursitis 12/2008  ? bilateral  ? ?Past Surgical History:  ?Procedure Laterality Date  ? ABDOMINAL HYSTERECTOMY    ? BACK SURGERY  05/2019  ? CATARACT EXTRACTION, BILATERAL  1995, 1996  ? EYE SURGERY Bilateral   ? laser - glaucoma  ? Flexible sigmoidoscopy    ? KYPHOPLASTY N/A 04/03/2019  ? Procedure: KYPHOPLASTY L1;  Surgeon: Melina Schools, MD;  Location: [place];  Service: Orthopedics;  Laterality: N/A;  90 mins  ? THYROIDECTOMY, PARTIAL  09/2005  ? L nodule; Dr. Harlow Asa  ? TONSILLECTOMY  1946  ? UPPER GI ENDOSCOPY  06/27/2012  ? VAGINAL HYSTERECTOMY  1971  ? and bladder repair.  Still has ovaries  ? WISDOM TOOTH EXTRACTION    ? ?Patient Active Problem List  ? Diagnosis Date Noted  ? Senile purpura ([place]) 11/04/2021  ? Calculus of gallbladder without cholecystitis without obstruction 11/01/2021  ? Rash 03/18/2021  ? Herpes zoster without complication 67/20/9470  ? Multiple allergies  03/18/2021  ? Frail elderly 03/18/2021  ? LUQ pain 10/29/2019  ? Poor appetite 10/29/2019  ? Loss of weight 10/29/2019  ? Nausea and vomiting 10/29/2019  ? Altered bowel habits 10/29/2019  ? Lumbar compression fracture ([place]) 04/03/2019  ? Compression fracture of L1 lumbar vertebra ([place]) 03/16/2019  ? Osteoporosis of multiple sites 03/16/2019  ? Essential tremor 02/06/2019  ? Ischemic colitis ([place])   ? Leukocytosis   ? Acute colitis 11/22/2018  ? Colitis 11/22/2018  ? Acute lower UTI 11/22/2018  ? Acute cystitis without hematuria   ? Parkinson's disease ([place]) 10/24/2018  ? Gait disturbance 11/28/2017  ? Chronic lymphocytic thyroiditis 04/24/2017  ? Status post removal of thyroid nodule 04/24/2017  ? Depression with anxiety 03/16/2017  ? Subcutaneous nodules 03/16/2017  ? Aortic atherosclerosis ([place]) 11/24/2016  ? Allergy  to multiple antibiotics 10/07/2016  ? Seafood allergy, anaphylaxis, subsequent encounter 10/07/2016  ? Helicobacter pylori gastritis 10/07/2016  ? Fall 09/05/2016  ? Bloating 08/15/2016  ? Severe recurrent major depression without psychotic features ([place]) 04/13/2016  ?  Class: Chronic  ? Rash and nonspecific skin eruption 03/01/2016  ? Leg pain, bilateral 03/01/2016  ? Functional dyspepsia 10/13/2015  ? Subacromial bursitis 03/02/2015  ? Resting tremor 02/01/2015  ? Epigastric fullness 10/12/2014  ? Early satiety 10/12/2014  ? Cystocele 12/30/2013  ? IBS (irritable bowel syndrome) 09/30/2013  ? GERD (gastroesophageal reflux disease) 06/27/2013  ? PVC's (premature ventricular contractions) 10/02/2012  ? Depressive disorder, not elsewhere classified 10/02/2012  ? Bradycardia 08/01/2012  ? Fatigue 08/01/2012  ? Anxiety state 05/13/2012  ? Osteopenia 05/13/2012  ? Hypothyroidism 05/13/2012  ? ? ?REFERRING DIAG:  ?M54.42,G89.29 (ICD-10-CM) - Chronic left-sided low back pain with left-sided sciatica  ?M51.36 (ICD-10-CM) - DDD (degenerative disc disease), lumbar  ?M16.12 (ICD-10-CM) - Primary osteoarthritis of left hip  ? ? ?THERAPY DIAG:  ?Other low back pain ? ?Muscle weakness (generalized) ? ?Pain in left hip ? ?PERTINENT HISTORY: Parkinson's disease; L1 compression fracture 2020; osteoporosis; had nerve blocks in past and fell on left side; ?Osteoporosis;  fibromyalgia;  married 110 years; negative experience with previous PT ? ?PRECAUTIONS: osteoporosis  ? ?SUBJECTIVE: I loved the pool.  I felt good for over 24 hours after. I was able to exercise and not have pain.  I'm having pain on the Rt side today. ? ?PAIN:  ?Are you having pain? Yes: NPRS scale: 6/10 ?Pain location: Rt gluteals ?Pain description: sore, painful ?Aggravating factors: bending, stretching, lifting  ?Relieving factors: ice ? ?OBJECTIVE: from evaluation on 10/27/21 ?  ?DIAGNOSTIC FINDINGS:  ?Not recently ?  ?PATIENT SURVEYS:  ?FOTO 45%       ?            ?SENSATION: ?WFL to light touch ?   ?POSTURE:  ?Decreased lumbar lordosis, increased thoracic kyphosis; right shoulder elevated in sitting and standing with slight right shift;  hand tremor ?  ?PALPATION: ?Tender points in bil gluteals and lumbar paraspinals;  decreased thoraco lumbar fascial mobility ?  ?LUMBAR ROM:  Uses partial squat to pick up items from floor;  unable to tolerate supine lying but OK with right sidelying  ?  ?Active  A/PROM  ?10/27/21  ?Flexion Not tested secondary to osteoporosis  ?Extension 0  ?Right lateral flexion    ?Left lateral flexion    ?Right rotation    ?Left rotation    ? (Blank rows = not tested) ?Trunk strength: decreased trunk extensor strength 4/5 and decreased activation of lower abdominals  ?LE  ROM:  WFLs, right and left knee pain with full extension ?LE MMT:  Able to rise from standard chair without UE assist  ?  ?LUMBAR SPECIAL TESTS:  ?Slump test: Negative ?  ?GAIT: ?Distance walked: 80 feet ?Assistive device utilized: Single point cane ?Level of assistance: Complete Independence ?Comments: shorter step length  ?  ? ?TODAY'S TREATMENT  ?Treatment on date: 12/05/21 ?Seated marching: 2x10 ?Long arc quads: 2x10- 5" hold ?Biceps curls and shoulder flexion 1# 2x10 bil each ?NuStep: level 1 x 8  minutes-PT present to discuss progress ?Seated hamstring stretch 2x20 seconds  ?Manual: elongation and release to Rt gluteals and quadratus ?12/02/21 ?Treatment: Pt arrives for aquatic physical therapy. Treatment took place in 3.5-5.5 feet of water. Water temperature was 91 degrees F.. Pt entered the pool via stairs with moderate use of rails. Pt requires buoyancy of water for support and to offload joints with strengthening exercises.   ?Seated water bench with 75% submersion ?Pt performed seated LE AROM exercises 20x in all planes, TC to help pt stay seated. Pain assessment concurrent. ? ?75% depth leaning against the bench initially then progressing to vertical stance: shoulder flex/ext  10x, blue noodle trunk rotation to the RT for gentle lower Lt stretch 2x10: forward walking 4 lengths 2x holding large noodle for back support. Horizontal decompression float with 100% flotation and PTA pro

## 2021-12-06 ENCOUNTER — Ambulatory Visit: Payer: Medicare Other | Admitting: Psychiatry

## 2021-12-07 ENCOUNTER — Ambulatory Visit: Payer: Medicare Other | Admitting: Physical Therapy

## 2021-12-07 DIAGNOSIS — M25552 Pain in left hip: Secondary | ICD-10-CM

## 2021-12-07 DIAGNOSIS — M5459 Other low back pain: Secondary | ICD-10-CM

## 2021-12-07 DIAGNOSIS — M6281 Muscle weakness (generalized): Secondary | ICD-10-CM | POA: Diagnosis not present

## 2021-12-07 NOTE — Therapy (Signed)
OUTPATIENT PHYSICAL THERAPY TREATMENT NOTE   Patient Name: Tracy Malone MRN: [id number] DOB:1932/06/13, 88 y.o., female Today's Date: 12/08/2021  PCP: [name], MD REFERRING PROVIDER: Glennon Mac, DO Progress Note Reporting Period 10/27/21 to 12/05/21  See note below for Objective Data and Assessment of Progress/Goals.     END OF SESSION:   PT End of Session - 12/08/21 0806     Visit Number 10    Date for PT Re-Evaluation 12/22/21    Authorization Type Medicare    Progress Note Due on Visit 19    PT Start Time 1100    PT Stop Time 1145    PT Time Calculation (min) 45 min    Activity Tolerance Patient tolerated treatment well    Behavior During Therapy [facility] for tasks assessed/performed                     Past Medical History:  Diagnosis Date   Bell's palsy 1966   Hx: right side facial droop, resolved per patient 04/02/19   Carotid artery disease ([place]) 2010   on vascular screening;unchanged 2013.(could not tolerate simvastatin, no other statins tried)--<30% blockage bilat 07/2011   Chronic abdominal pain    Chronic fatigue and malaise    Claustrophobia    Cyst of left ovary    last imaging 06/2021, benign, no further f/u   Depression    treated in the past for years;stopped in 2010 for a years   Duodenal ulcer 1962   h/o   Dysrhythmia    ocassional PVC's, no current problems per patient on 04/02/19   Fibromyalgia    Frequent PVCs 07/2012   Seen by [facility]: benign, asymptomatic, normal EF   Gallstones 02/2021   on [place]   GERD (gastroesophageal reflux disease)    diet controlled   Glaucoma, narrow-angle    s/p laser surgery   History of hiatal hernia    during endoscopy   Hypothyroid 03/2007   IBS (irritable bowel syndrome)    Dr. Benson Norway   Ischemic colitis ([place]) 11/21/2018   no current problems per patient on 4/0/98   Lichenoid keratosis 11/91/4782   Dr.Stinehelfer   Ocular migraine    Osteoporosis 04/2010   Dr.Hawkes; later consulted Dr.  Buddy Duty (2022)   Panic attack    Parkinson disease ([place])    Parkinson's disease ([place]) 06/23/2016   Recurrent UTI    has cystocele-Dr.Grewal   Shingles 1999   h/o   Superficial thrombophlebitis 03/2009   RLE   Trochanteric bursitis 12/2008   bilateral   Past Surgical History:  Procedure Laterality Date   ABDOMINAL HYSTERECTOMY     BACK SURGERY  05/2019   CATARACT EXTRACTION, BILATERAL  1995, 1996   EYE SURGERY Bilateral    laser - glaucoma   Flexible sigmoidoscopy     KYPHOPLASTY N/A 04/03/2019   Procedure: KYPHOPLASTY L1;  Surgeon: Melina Schools, MD;  Location: [place];  Service: Orthopedics;  Laterality: N/A;  90 mins   THYROIDECTOMY, PARTIAL  09/2005   L nodule; Dr. Harlow Asa   TONSILLECTOMY  1946   UPPER GI ENDOSCOPY  06/27/2012   VAGINAL HYSTERECTOMY  1971   and bladder repair.  Still has ovaries   WISDOM TOOTH EXTRACTION     Patient Active Problem List   Diagnosis Date Noted   Senile purpura ([place]) 11/04/2021   Calculus of gallbladder without cholecystitis without obstruction 11/01/2021   Rash 03/18/2021   Herpes zoster without complication 95/62/1308   Multiple  allergies 03/18/2021   Frail elderly 03/18/2021   LUQ pain 10/29/2019   Poor appetite 10/29/2019   Loss of weight 10/29/2019   Nausea and vomiting 10/29/2019   Altered bowel habits 10/29/2019   Lumbar compression fracture ([place]) 04/03/2019   Compression fracture of L1 lumbar vertebra ([facility]) 03/16/2019   Osteoporosis of multiple sites 03/16/2019   Essential tremor 02/06/2019   Ischemic colitis ([place])    Leukocytosis    Acute colitis 11/22/2018   Colitis 11/22/2018   Acute lower UTI 11/22/2018   Acute cystitis without hematuria    Parkinson's disease ([place]) 10/24/2018   Gait disturbance 11/28/2017   Chronic lymphocytic thyroiditis 04/24/2017   Status post removal of thyroid nodule 04/24/2017   Depression with anxiety 03/16/2017   Subcutaneous nodules 03/16/2017   Aortic atherosclerosis ([place]) 11/24/2016    Allergy to multiple antibiotics 10/07/2016   Seafood allergy, anaphylaxis, subsequent encounter 70/78/6754   Helicobacter pylori gastritis 10/07/2016   Fall 09/05/2016   Bloating 08/15/2016   Severe recurrent major depression without psychotic features ([place]) 04/13/2016    Class: Chronic   Rash and nonspecific skin eruption 03/01/2016   Leg pain, bilateral 03/01/2016   Functional dyspepsia 10/13/2015   Subacromial bursitis 03/02/2015   Resting tremor 02/01/2015   Epigastric fullness 10/12/2014   Early satiety 10/12/2014   Cystocele 12/30/2013   IBS (irritable bowel syndrome) 09/30/2013   GERD (gastroesophageal reflux disease) 06/27/2013   PVC's (premature ventricular contractions) 10/02/2012   Depressive disorder, not elsewhere classified 10/02/2012   Bradycardia 08/01/2012   Fatigue 08/01/2012   Anxiety state 05/13/2012   Osteopenia 05/13/2012   Hypothyroidism 05/13/2012    REFERRING DIAG:  M54.42,G89.29 (ICD-10-CM) - Chronic left-sided low back pain with left-sided sciatica  M51.36 (ICD-10-CM) - DDD (degenerative disc disease), lumbar  M16.12 (ICD-10-CM) - Primary osteoarthritis of left hip    THERAPY DIAG:  Other low back pain  Muscle weakness (generalized)  Pain in left hip  PERTINENT HISTORY: Parkinson's disease; L1 compression fracture 2020; osteoporosis; had nerve blocks in past and fell on left side; Osteoporosis;  fibromyalgia;  married 7 years; negative experience with previous PT  PRECAUTIONS: osteoporosis   SUBJECTIVE: I awoke pain free but as I do things I begin to hurt more.   PAIN:  Are you having pain? 6/10 in my Lt chest muscle and Lt low back.   OBJECTIVE: from evaluation on 10/27/21   DIAGNOSTIC FINDINGS:  Not recently   PATIENT SURVEYS:  FOTO 45%                  SENSATION: WFL to light touch    POSTURE:  Decreased lumbar lordosis, increased thoracic kyphosis; right shoulder elevated in sitting and standing with slight right shift;  hand  tremor   PALPATION: Tender points in bil gluteals and lumbar paraspinals;  decreased thoraco lumbar fascial mobility   LUMBAR ROM:  Uses partial squat to pick up items from floor;  unable to tolerate supine lying but OK with right sidelying    Active  A/PROM  10/27/21  Flexion Not tested secondary to osteoporosis  Extension 0  Right lateral flexion    Left lateral flexion    Right rotation    Left rotation     (Blank rows = not tested) Trunk strength: decreased trunk extensor strength 4/5 and decreased activation of lower abdominals  LE ROM:  WFLs, right and left knee pain with full extension LE MMT:  Able to rise from standard chair without UE assist  LUMBAR SPECIAL TESTS:  Slump test: Negative   GAIT: Distance walked: 80 feet Assistive device utilized: Single point cane Level of assistance: Complete Independence Comments: shorter step length     TODAY'S TREATMENT   12/07/21: Pt arrives for aquatic physical therapy. Treatment took place in 3.5-5.5 feet of water. Water temperature was 91 degrees F.. Pt entered the pool via stairs with moderate use of rails. Pt requires buoyancy of water for support and to offload joints with strengthening exercises.   Seated water bench with 75% submersion Pt performed seated LE AROM exercises 20x in all planes, TC to help pt stay seated. Pain assessment concurrent.  75% depth leaning against the bench initially then progressing to vertical stance: shoulder flex/ext 10x, blue noodle trunk rotation to the RT for gentle lower Lt stretch 3x10: forward walking 46lengths 2x holding large noodle for back support. Horizontal decompression float with 100% flotation and PTA providing lateral sway to mobilize trunk.  Treatment on date: 12/05/21 Seated marching: 2x10 Long arc quads: 2x10- 5" hold Biceps curls and shoulder flexion 1# 2x10 bil each NuStep: level 1 x 8  minutes-PT present to discuss progress Seated hamstring stretch 2x20 seconds  Manual:  elongation and release to Rt gluteals and quadratus 12/02/21 Treatment: Pt arrives for aquatic physical therapy. Treatment took place in 3.5-5.5 feet of water. Water temperature was 91 degrees F.. Pt entered the pool via stairs with moderate use of rails. Pt requires buoyancy of water for support and to offload joints with strengthening exercises.   Seated water bench with 75% submersion Pt performed seated LE AROM exercises 20x in all planes, TC to help pt stay seated. Pain assessment concurrent.  75% depth leaning against the bench initially then progressing to vertical stance: shoulder flex/ext 10x, blue noodle trunk rotation to the RT for gentle lower Lt stretch 2x10: forward walking 4 lengths 2x holding large noodle for back support. Horizontal decompression float with 100% flotation and PTA providing lateral sway to mobilize trunk.   Treatment on date: 11/28/21 Seated marching: 2x10 Long arc quads: 2x10- 5" hold Biceps curls and shoulder flexion 2# 2x10 bil each NuStep: level 1 x 8  minutes-PT present to discuss progress Seated hamstring stretch 2x20 seconds  Manual: elongation and release to Lt gluteals and quadratus   PATIENT EDUCATION:  Education details: [id number], aquatic info (11/22/21) Person educated: Patient Education method: Explanation Education comprehension: verbalized understanding     HOME EXERCISE PROGRAM: Access Code: [email or web address] URL: [email or web address] Date: 11/09/2021 Prepared by: Claiborne Billings  Exercises - Seated Hamstring Stretch  - 3 x daily - 7 x weekly - 1 sets - 3 reps - 20 hold - Seated Figure 4 Piriformis Stretch  - 1 x daily - 7 x weekly - 1 sets - 3 reps - 20 hold - Seated Hip Adduction Isometrics with Ball  - 2 x daily - 7 x weekly - 1 sets - 10 reps - 5 hold - Seated Hip Abduction with Resistance  - 2 x daily - 7 x weekly - 1 sets - 10 reps   ASSESSMENT:   CLINICAL IMPRESSION: Pt arrives to aquatic therapy with complaints of Lt pectoral pain  and low back pain. Pain was pretty much abolished when in the pool. Pt was able to make slight increases in her routine without too much fatigue. LE do get tired but appropriate for the task at at hand.     OBJECTIVE IMPAIRMENTS difficulty walking, decreased strength, increased fascial restrictions, and pain.    ACTIVITY  LIMITATIONS cleaning, meal prep, and shopping.    PERSONAL FACTORS Age, Past/current experiences, Time since onset of injury/illness/exacerbation, and 1 comorbidity: Parkinson's, fibromyalgia, osteoporosis   are also affecting patient's functional outcome.      REHAB POTENTIAL: Good   CLINICAL DECISION MAKING: Stable/uncomplicated   EVALUATION COMPLEXITY: Low     GOALS: Goals reviewed with patient? Yes   SHORT TERM GOALS: Target date: 11/24/2021   The patient will demonstrate knowledge of basic self care strategies and exercises to promote healing and pain relief Baseline: Goal status: MET   2.  The patient will report a 30% improvement in pain levels with functional activities which are currently difficult including standing and walking for shopping Baseline: pt has had some painfree days after PT (12/05/21) Goal status: in progress    3.  The patient will be able to walk in the grocery store 15 minutes with the shopping cart without major exacerbation of pain Baseline:  increased pain with this Goal status: INITIAL       LONG TERM GOALS: Target date: 12/22/2021   The patient will be independent in a safe self progression of a home exercise program to promote further recovery of function   Baseline:  Goal status: In progress    2.  The patient will report a 65% improvement in pain levels with functional activities which are currently difficult including standing and walking Baseline:  Goal status: INITIAL   3.  The patient will be able to stand for light meal prep 8-10 min without exacerbation of pain Baseline:  able to stand for up to 15 min Goal status:  MET *(12/05/21)   4.   FOTO score improved from 45% to 52% indicating improved function with less pain Baseline:  Goal status: INITIAL         PLAN: PT FREQUENCY: 2x/week   PT DURATION: 8 weeks   PLANNED INTERVENTIONS: Therapeutic exercises, Therapeutic activity, Neuromuscular re-education, Balance training, Gait training, Patient/Family education, Joint mobilization, Aquatic Therapy, Dry Needling, Electrical stimulation, Moist heat, Taping, Ultrasound, Ionotophoresis 71m/ml Dexamethasone, and Manual therapy.   PLAN FOR NEXT SESSION:  PT will do renewal next week.   JMyrene Galas PTA 12/08/21 8:08 AM    [facility] Harbor[address] [place] [address]Phone # [phone] [phone]

## 2021-12-08 ENCOUNTER — Encounter: Payer: Self-pay | Admitting: Physical Therapy

## 2021-12-13 ENCOUNTER — Ambulatory Visit (INDEPENDENT_AMBULATORY_CARE_PROVIDER_SITE_OTHER): Payer: Medicare Other | Admitting: Psychiatry

## 2021-12-13 DIAGNOSIS — I8312 Varicose veins of left lower extremity with inflammation: Secondary | ICD-10-CM | POA: Diagnosis not present

## 2021-12-13 DIAGNOSIS — F411 Generalized anxiety disorder: Secondary | ICD-10-CM | POA: Diagnosis not present

## 2021-12-13 DIAGNOSIS — I8311 Varicose veins of right lower extremity with inflammation: Secondary | ICD-10-CM | POA: Diagnosis not present

## 2021-12-13 NOTE — Progress Notes (Signed)
[facility] Counselor/Therapist Progress Note  Patient ID: Tracy Malone, MRN: [id number],    Date: 12/13/2021  Time Spent: 50 minutes   Treatment Type: Individual Therapy  Reported Symptoms: anxiety, some depression, some tearfulness but "not really sad", distressed over her continued back pain, "I get stressed real quick especially when my husband has difficulty hearing me talk"  Mental Status Exam:  Appearance:   Casual     Behavior:  Appropriate, Sharing, and Motivated  Motor:  Uses rollater when walking  Speech/Language:   Clear and Coherent  Affect:  Depressed and anxious  Mood:  anxious and depressed  Thought process:  goal directed  Thought content:    Overthinking, some rumination  Sensory/Perceptual disturbances:    WNL  Orientation:  oriented to person, place, time/date, situation, day of week, month of year, year, and stated date of Dec 13, 2021  Attention:  Good  Concentration:  Good and Fair  Memory:  WNL  Fund of knowledge:   Good  Insight:    Good and Fair  Judgment:   Good  Impulse Control:  Good   Risk Assessment: Danger to Self:  No Self-injurious Behavior: No Danger to Others: No Duty to Warn:no Physical Aggression / Violence:No  Access to Firearms a concern: No  Gang Involvement:No   Subjective: Patient in today reporting anxiety and depression, and worries about her husband's aging and hearing. Processed today a lot of her concerns and worries about her and her husband's health challenges. Mood fluctuates at times but more pleasant currently.  Did get tearful at 1 point in session talking about her concerns about her husband not hearing as well and has been reluctant to go have his hearing tested.  He has agreed more recently to check in to being tested as he is eligible through the [place].  Patient also shared that she has looked online at some area retirement community's but tends to have negative feelings about them without having gone and visited  yet.  States that she would like for her and her husband to be able to go and talk with someone at a few of the local places and may follow up on that by giving them a call.  States she still having her aches and pains and dealing with Parkinson's issues but no new "pains" and seemed to be having a better overall day today.  Does have some tearfulness at times, only once today, when she says that she will get teary-eyed but not really feeling sad.  Explained how some things just a that brings out tears and sometimes it could be good feelings or things that are dear to her heart.  Reviewed the negative self talk again with patient today as discussed last session and she does feel she is better with that.  Encouraging her also to get outside some even if it is just to sit in her driveway and wave at the neighbors when they go by like she has been on some other occasions with her husband.  Also encouraged her visiting the nearby [employer] to possibly check out some books that she would be interested in reading, as she has stated in sessions before that she enjoys that type of thing.  Continues involvement in PT and likes the swimming pool.  Trying to intercept negative thoughts that take her in a negative direction and instead being able to replace them with more positive thoughts that do not deflate her mood.  Interventions:  Solution-Oriented/Positive Psychology, Ego-Supportive, and Insight-Oriented   Treatment Goal Plan:  Patient not signing treatment plan on computer screen due to [place]. Treatment Goals: Treatment goals will remain on treatment plan as patient works with strategies to achieve her goals.  Progress will be noted each session and documented in the "progress" section of note. Long term goal: Develop the ability to recognize, accept, and cope with feelings of anxiety and depression. Short term goal: Verbalize any unresolved grief issues that may be contributing to anxiety and  depression. Strategy: Replace negative self-defeating self talk with verbalization of realistic and positive cognitive messages to lessen depression/anxiety and improve mood.  Diagnosis:   ICD-10-CM   1. Generalized anxiety disorder  F41.1      Plan:  Patient in today with good participation and motivation as she focused on her anxiety and some depression as it relates a lot to some issues of aging for her and her husband, her husband's decreased hearing capability, the need for more positive self talk on her part, and managing some of her depressive feelings and anxiety.  Patient was very active in her talking today and seemed to be more positive most of the time.  Did have a few tears as noted above and were more related to sentimental things or some anxieties about the future.  Did well in talking through several different stressors in session today and seemed to be feeling some stronger emotionally overall.  Does still struggle with looking more for the negatives versus positives but was to stop some negating and replace it with a more positive thought while we were in session today.  Notes that it is a challenge to break old habits.  Is hopeful that her husband will follow through on his statement about having his hearing checked as that would affect their ability and communicating more clearly.  With the weather getting better, patient encouraged to get outside more and interact with neighbors and at her church as they are able to attend which patient seems to really enjoy even if it is watching online. Encouraged patient in her practice of more positive behaviors including: Overall positive self-care, consider doing more activities that she enjoys such as reading and talking with friends on the phone, listening to music which she finds calming, walking some as she is able, getting books from the nearby [employer], not overly focusing on negatives nor assuming worst case scenarios, remaining on  her medications as prescribed, moving about carefully for exercise, allow for good sleep patterns including not sleeping too much during the day, staying in the present and focused on what she can control or change, remaining in contact with family and friends who are supportive, and realize the strength she shows working with goal directed behaviors to move in a direction that supports her improved emotional health.  Goal review and progress/challenges noted with patient.  Next appointment within approximately 3 weeks.  This record has been created using [employer].  Chart creation errors have been sought, but may not always have been located and corrected.  Such creation errors do not reflect on the standard of medical care provided.   Shanon Ace, LCSW

## 2021-12-14 ENCOUNTER — Ambulatory Visit: Payer: Medicare Other

## 2021-12-14 ENCOUNTER — Ambulatory Visit (INDEPENDENT_AMBULATORY_CARE_PROVIDER_SITE_OTHER): Payer: Medicare Other | Admitting: Neurology

## 2021-12-14 ENCOUNTER — Encounter: Payer: Self-pay | Admitting: Neurology

## 2021-12-14 VITALS — BP 140/60 | HR 74 | Ht 59.0 in | Wt 97.5 lb

## 2021-12-14 DIAGNOSIS — S32010S Wedge compression fracture of first lumbar vertebra, sequela: Secondary | ICD-10-CM

## 2021-12-14 DIAGNOSIS — R269 Unspecified abnormalities of gait and mobility: Secondary | ICD-10-CM | POA: Diagnosis not present

## 2021-12-14 DIAGNOSIS — F418 Other specified anxiety disorders: Secondary | ICD-10-CM

## 2021-12-14 DIAGNOSIS — G20 Parkinson's disease: Secondary | ICD-10-CM | POA: Diagnosis not present

## 2021-12-14 DIAGNOSIS — G250 Essential tremor: Secondary | ICD-10-CM | POA: Diagnosis not present

## 2021-12-14 DIAGNOSIS — M6281 Muscle weakness (generalized): Secondary | ICD-10-CM

## 2021-12-14 DIAGNOSIS — M25552 Pain in left hip: Secondary | ICD-10-CM | POA: Diagnosis not present

## 2021-12-14 DIAGNOSIS — M5459 Other low back pain: Secondary | ICD-10-CM

## 2021-12-14 DIAGNOSIS — G20A1 Parkinson's disease without dyskinesia, without mention of fluctuations: Secondary | ICD-10-CM

## 2021-12-14 NOTE — Progress Notes (Signed)
[facility]  PATIENT: Tracy Malone DOB: [birth year]  REFERRING DOCTOR OR PCP:  Joselyn Arrow SOURCE: patient and EMR records  _________________________________   HISTORICAL  CHIEF COMPLAINT:  Chief Complaint  Patient presents with   Follow-up    Rm 2, w husband. Here for 6 month PD f/u.     HISTORY OF PRESENT ILLNESS:  Tracy Malone is a 88 y.o.  woman with PD (vs PD/BET), anxiety/depression and history of L1 fracture.    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    On Sinemet, her gait improved but the tremor did not change much.  Alprazolam 0.5 mg po qAm,   1 mg qHS has helped her tremor some.  She had a lot of benefit taking propranolol for the tremor but she had hypotension and needed to stop.    Her handwriting is poor.   Tremor is at rest and with intention.       She is taking Sinemet 25/100 tid and tolerates it well She takes every 4-5 hours while awake.    She is not having GI issues.   She feels her gait is worse but she is having more back pain which is affecting the gait.   She had a fall and broke 2 ribs.    She notes the tremor more on her right than the left.   Right side also feels stiffer.     She has had more pain in the left buttock/hip.   Soma had helped her some in past and she is asking about that.     She is doing PT but is not sure if it is helping     She is asking about focused ultrasound for Parkinson   I have sen articles about it but do not know if anyone at [facility] is doing.    [facility] had a mention of it on their PD page.     She feels tremor is about the same    She notes it while resting but gets worse when she writes or if she gets upset.. It is also worse when anxious..     She has anxiety that makes her fidgety.   She has milder depression with occasional crying.  She is not sure how much alprazolam helps.  Prozac and Lexapro caused stomach problems and headaches.  She did ok on Zoloft but not generic sertraline.   She  sees [facility].   She was on Seroquel 25 mg and sleep was much better.   It was stopped due to a concern about interaction with Sinemet - the interaction is not dangerous.  Also, low dose did not worse tremor or gait.     She fractured the L1 vertebrae September 2020 while moving a mattress and had kyphoplasty 04/03/2019.   She has also had shots in her back (nerve blocks and ESI) and PT.  She feels PT helped more than the shots..      She had lumbar surgery (Dr. Shon Baton) recently and continues to have pain (back and leg ito the thigh).     Tremor History:      She first noted a right hand tremor in late 2015.    She notes the tremor in the right hand much more than the left.   It worsened with intention but also present at rest   Tremor is not present when asleep.    She has not noted the tremor in the head.  Initially, she did not have any difficulty with her gait or balance.   There is no family history of tremors.   Alprazolam seemed to help the tremor.   Propranolol helped the tremor quite a bit but was poorly tolerated.   She started to have more gait issues around 2019   Sinemet seemed to help the gait but not the tremor.       REVIEW OF SYSTEMS: Constitutional: No fevers, chills, sweats, or change in appetite Eyes: No visual changes, double vision, eye pain Ear, nose and throat: No hearing loss, ear pain, nasal congestion, sore throat Cardiovascular: No chest pain, palpitations.  Has had palpitations Respiratory:  No shortness of breath at rest or with exertion.   No wheezes GastrointestinaI: No nausea, vomiting, diarrhea, abdominal pain, fecal incontinence Genitourinary:  No dysuria, urinary retention or frequency.  No nocturia. Musculoskeletal:  No neck pain, back pain.  She notes right shoulder pain Integumentary: No rash, pruritus, skin lesions Neurological: as above Psychiatric: Some crying spells..  Some anxiety Endocrine: No palpitations, diaphoresis, change in appetite, change in  weigh or increased thirst Hematologic/Lymphatic:  No anemia, purpura, petechiae. Allergic/Immunologic: No itchy/runny eyes, nasal congestion, recent allergic reactions, rashes  ALLERGIES: Allergies  Allergen Reactions   Iodinated Contrast Media Anaphylaxis   Iodine Anaphylaxis    IV and topical forms. Other reaction(s): Unknown   Levsin [Hyoscyamine Sulfate]     Vision problems/pt has glaucoma   Salmon [Fish Allergy] Hives and Shortness Of Breath   Shellfish Allergy Anaphylaxis   Tramadol Swelling   Remeron [Mirtazapine] Other (See Comments)    Cause blurred vision and red eyes, pt has glaucoma   Aspirin Other (See Comments)    Sever stomach pain due to ulcer scaring.   Bis Subcit-Metronid-Tetracyc Swelling    Tongue swelling. Face tingling Other reaction(s): Unknown   Cephalexin Hives    Other reaction(s): hives   Ciprofloxacin Diarrhea   Codeine Nausea And Vomiting   Cyclobenzaprine Other (See Comments)    Tingly/prickly sensation. Other reaction(s): tingly/prickly sensation   Darvocet [Propoxyphene N-Acetaminophen] Nausea And Vomiting   Demerol [Meperidine] Nausea Only   Dexlansoprazole Swelling    Redness, swelling and peeling of both feet. Other reaction(s): foot pain   Diphedryl [Diphenhydramine] Other (See Comments)    Increased pulse/small amount ok   Doxycycline Hyclate Other (See Comments)    GI intolerance.   Doxycycline Hyclate     Other reaction(s): GI intolerance   Epinephrine Other (See Comments)    Breathing problems Other reaction(s): breathing problems/fainting   Erythromycin Other (See Comments)    GI intolerance. Other reaction(s): GI   Fish Oil     Other reaction(s): breathing problems/hives   Hyoscyamine     Other reaction(s): eye pain   Latex Other (See Comments)    Gloves ok.  Skin gets red from elastic in underwear and latex bandaides.   Meperidine Hcl     Other reaction(s): vomiting   Nitrofurantoin Diarrhea   Other     Other  reaction(s): migraines Other reaction(s): Unknown Other reaction(s): Unknown Other reaction(s): Unknown Other reaction(s): increased pulse, faint, diarrhea   Prednisone Other (See Comments)    Headache Other reaction(s): headache   Ra Diphedryl Allergy [Diphenhydramine Hcl]     Other reaction(s): increased pulse small dose okay   Sertraline Hcl Swelling and Other (See Comments)    Migraine Swelling of tongue/lip (09/2012) Other reaction(s): Unknown   Shellfish-Derived Products     Other reaction(s): Unknown   Sulfa  Antibiotics Other (See Comments)    Increased pulse, fainting, diarrhea, thrush   Wellbutrin [Bupropion] Other (See Comments)    Headaches   Xylocaine [Lidocaine Hcl]     With epinephrine, given by dentist.  Speeded up heart rate and she passed out (occured twice, at dentist)   Xylocaine [Lidocaine]     Other reaction(s): Unknown   Biaxin [Clarithromycin] Rash    Started after completing 10 day course of 2000 mg /day, Lips swelling   Ibuprofen Other (See Comments)    Motrin ok with a GI effect. Other reaction(s): rash Motrin okay with a GI effect    HOME MEDICATIONS:  Current Outpatient Medications:    ALPRAZolam (XANAX) 0.5 MG tablet, Take four tablets daily as needed for anxiety., Disp: 120 tablet, Rfl: 2   b complex vitamins tablet, Take 1 tablet by mouth daily., Disp: , Rfl:    Calcium Citrate-Vitamin D (CALCIUM CITRATE +D PO), Take 1 tablet by mouth in the morning and at bedtime. Taking 400 mg of calcium and 500 units of vitamin D twice daily (total 800 mg calcium and 1000 units of vitamin D), Disp: , Rfl:    Carbidopa-Levodopa ER (SINEMET CR) 25-100 MG tablet controlled release, TAKE ONE TABLET BY MOUTH EVERY MORNING; 1 TABLET AT NOON; AND 1 TABLET AT BEDTIME, Disp: 270 tablet, Rfl: 1   dicyclomine (BENTYL) 10 MG capsule, Take 1 capsule (10 mg total) by mouth every 6 (six) hours as needed., Disp: 30 capsule, Rfl: 0   Probiotic Product (ALIGN PO), Take 1 capsule  by mouth daily., Disp: , Rfl:    SYNTHROID 25 MCG tablet, TAKE 1 TABLET DAILY BEFORE BREAKFAST FOR HYPOTHYROIDISM, Disp: 90 tablet, Rfl: 0  PAST MEDICAL HISTORY: Past Medical History:  Diagnosis Date   Bell's palsy 1966   Hx: right side facial droop, resolved per patient 04/02/19   Carotid artery disease (HCC) 2010   on vascular screening;unchanged 2013.(could not tolerate simvastatin, no other statins tried)--<30% blockage bilat 07/2011   Chronic abdominal pain    Chronic fatigue and malaise    Claustrophobia    Cyst of left ovary    last imaging 06/2021, benign, no further f/u   Depression    treated in the past for years;stopped in 2010 for a years   Duodenal ulcer 1962   h/o   Dysrhythmia    ocassional PVC's, no current problems per patient on 04/02/19   Fibromyalgia    Frequent PVCs 07/2012   Seen by [facility]: benign, asymptomatic, normal EF   Gallstones 02/2021   on [place]   GERD (gastroesophageal reflux disease)    diet controlled   Glaucoma, narrow-angle    s/p laser surgery   History of hiatal hernia    during endoscopy   Hypothyroid 03/2007   IBS (irritable bowel syndrome)    Dr. Elnoria Howard   Ischemic colitis (HCC) 11/21/2018   no current problems per patient on 04/02/19   Lichenoid keratosis 12/03/2020   Dr.Stinehelfer   Ocular migraine    Osteoporosis 04/2010   Dr.Hawkes; later consulted Dr. Sharl Ma (2022)   Panic attack    Parkinson disease (HCC)    Parkinson's disease (HCC) 06/23/2016   Recurrent UTI    has cystocele-Dr.Grewal   Shingles 1999   h/o   Superficial thrombophlebitis 03/2009   RLE   Trochanteric bursitis 12/2008   bilateral    PAST SURGICAL HISTORY: Past Surgical History:  Procedure Laterality Date   ABDOMINAL HYSTERECTOMY     BACK SURGERY  05/2019   CATARACT EXTRACTION, BILATERAL  1995, 1996   EYE SURGERY Bilateral    laser - glaucoma   Flexible sigmoidoscopy     KYPHOPLASTY N/A 04/03/2019   Procedure: KYPHOPLASTY L1;  Surgeon: Venita Lick, MD;  Location: [facility] OR;  Service: Orthopedics;  Laterality: N/A;  90 mins   THYROIDECTOMY, PARTIAL  09/2005   L nodule; Dr. Gerrit Friends   TONSILLECTOMY  1946   UPPER GI ENDOSCOPY  06/27/2012   VAGINAL HYSTERECTOMY  1971   and bladder repair.  Still has ovaries   WISDOM TOOTH EXTRACTION      FAMILY HISTORY: Family History  Problem Relation Age of Onset   Heart disease Mother    Hypertension Mother    Hypertension Sister    HIV Son    Heart disease Brother    Lung cancer Brother        lung   Diabetes Maternal Grandfather    Diabetes Granddaughter        type 1   Colon cancer Neg Hx    Esophageal cancer Neg Hx    Prostate cancer Neg Hx    Stomach cancer Neg Hx    Rectal cancer Neg Hx     SOCIAL HISTORY:  Social History   Socioeconomic History   Marital status: Married    Spouse name: Chrissie Noa   Number of children: 2   Years of education: Not on file   Highest education level: Not on file  Occupational History   Occupation: retired (school system)  Tobacco Use   Smoking status: Never   Smokeless tobacco: Never  Vaping Use   Vaping Use: Never used  Substance and Sexual Activity   Alcohol use: No    Alcohol/week: 0.0 standard drinks   Drug use: No   Sexual activity: Not Currently    Partners: Male    Birth control/protection: Other-see comments    Comment: Hysterectomy  Other Topics Concern   Not on file  Social History Narrative   Married.  Son lives in [place]; Daughter [name] lives in [place]; 2 grandchildren   Social Determinants of Health   Financial Resource Strain: Low Risk    Difficulty of Paying Living Expenses: Not hard at all  Food Insecurity: Not on file  Transportation Needs: No Transportation Needs   Lack of Transportation (Medical): No   Lack of Transportation (Non-Medical): No  Physical Activity: Not on file  Stress: Not on file  Social Connections: Not on file  Intimate Partner Violence: Not on file     PHYSICAL EXAM  Vitals:    12/14/21 1332  BP: 140/60  Pulse: 74  Weight: 97 lb 8 oz (44.2 kg)  Height: 4\' 11"  (1.499 m)    Body mass index is 19.69 kg/m.   General: The patient is well-developed and well-nourished and in no acute distress  Skin:   No edema.    There were a couple small subcutaneous nodules in the right arm. .    Neurologic Exam  Mental status: The patient is alert and oriented x 3 at the time of the examination. The patient has apparent normal recent and remote memory, with an apparently normal attention span and concentration ability.   Speech is normal.  Cranial nerves: Extraocular movements are full.   Facial strength is normal.  Trapezius and sternocleidomastoid strength is normal. No dysarthria is noted.    Motor: She has mild bradykinesia.  There is a 5-6 Hz tremor on the right >> left  hand.  It is worse during rest and also increases with intention.  Talking about it worsened it.   Mild right cogwheeling.   Muscle bulk is normal.   Strength is  5 / 5 in all 4 extremities.   Sensory: Sensory testing is intact to touch and vibration sensation in all 4 extremities.  Coordination: Cerebellar testing reveals good finger-nose-finger bilaterally.  Gait and station: Station is normal.   The gait has a mildly reduced stride, and normal arm swing.    She was able to turn 180 degrees in 4 steps    Romberg is negative.   Reflexes: Deep tendon reflexes are symmetric and normal bilaterally.    DIAGNOSTIC DATA (LABS, IMAGING, TESTING) - I reviewed patient records, labs, notes, testing and imaging myself where available.  Lab Results  Component Value Date   WBC 6.1 09/23/2021   HGB 12.8 09/23/2021   HCT 38.5 09/23/2021   MCV 91.5 09/23/2021   PLT 261.0 09/23/2021      Component Value Date/Time   NA 142 11/02/2021 1117   K 4.1 11/02/2021 1117   CL 104 11/02/2021 1117   CO2 26 11/02/2021 1117   GLUCOSE 89 11/02/2021 1117   GLUCOSE 147 (H) 09/23/2021 1318   BUN 12 11/02/2021 1117    CREATININE 0.64 11/02/2021 1117   CREATININE 0.75 10/22/2018 1401   CREATININE 0.67 11/23/2016 0815   CALCIUM 10.0 11/02/2021 1117   PROT 6.4 11/02/2021 1117   ALBUMIN 4.4 11/02/2021 1117   AST 23 11/02/2021 1117   AST 17 10/22/2018 1401   ALT 4 11/02/2021 1117   ALT 7 10/22/2018 1401   ALKPHOS 69 11/02/2021 1117   BILITOT 1.3 (H) 11/02/2021 1117   BILITOT 0.8 10/22/2018 1401   GFRNONAA >60 03/07/2021 0755   GFRNONAA >60 10/22/2018 1401   GFRAA >60 03/16/2020 1153   GFRAA >60 10/22/2018 1401   Lab Results  Component Value Date   CHOL 198 03/10/2021   HDL 66 03/10/2021   LDLCALC 112 (H) 03/10/2021   TRIG 111 03/10/2021   CHOLHDL 3.0 03/10/2021   Lab Results  Component Value Date   HGBA1C 5.3 10/07/2020   No results found for: VITAMINB12 Lab Results  Component Value Date   TSH 2.180 10/06/2021       ASSESSMENT AND PLAN    1. Parkinson's disease (HCC)   2. Essential tremor   3. Compression fracture of L1 vertebra, sequela   4. Gait disturbance   5. Depression with anxiety      1.    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  continue Sinemet CR 25/100 3 pills a day (she felt stomach issues less when she took before a meal an dshe will start doing that.) Continue alprazolam (written by psychiatry) which will help BET some 2.   Advised to use walker for safety 3.    We discussed referral to a movement disorder specialist at [facility].   She has inquired about the focused ultrasound.   4.    Ok for low dose soma  (already has and just takes 1/4 pill which helps back). 5.   She will return to see me in 6 months or as needed if there are new or worsening neurologic symptoms.   Jeric Slagel A. Epimenio Foot, MD, PhD 12/14/2021, 2:18 PM Certified in Neurology, Clinical Neurophysiology, Sleep Medicine, Pain Medicine and Neuroimaging  [facility] [address], [address] [place], [place] [address] [phone]

## 2021-12-14 NOTE — Therapy (Signed)
OUTPATIENT PHYSICAL THERAPY TREATMENT NOTE   Patient Name: Tracy Malone MRN: [id number] DOB:1932/04/23, 88 y.o., female Today's Date: 12/14/2021  PCP: [name], MD REFERRING PROVIDER: [name], MD Progress Note Reporting Period 10/27/21 to 12/05/21  See note below for Objective Data and Assessment of Progress/Goals.     END OF SESSION:   PT End of Session - 12/14/21 1110     Visit Number 11    Date for PT Re-Evaluation 12/22/21    Authorization Type Medicare    Progress Note Due on Visit 19    PT Start Time 1016    PT Stop Time 1102    PT Time Calculation (min) 46 min    Activity Tolerance Patient tolerated treatment well    Behavior During Therapy [facility] for tasks assessed/performed                      Past Medical History:  Diagnosis Date   Bell's palsy 1966   Hx: right side facial droop, resolved per patient 04/02/19   Carotid artery disease ([place]) 2010   on vascular screening;unchanged 2013.(could not tolerate simvastatin, no other statins tried)--<30% blockage bilat 07/2011   Chronic abdominal pain    Chronic fatigue and malaise    Claustrophobia    Cyst of left ovary    last imaging 06/2021, benign, no further f/u   Depression    treated in the past for years;stopped in 2010 for a years   Duodenal ulcer 1962   h/o   Dysrhythmia    ocassional PVC's, no current problems per patient on 04/02/19   Fibromyalgia    Frequent PVCs 07/2012   Seen by [facility]: benign, asymptomatic, normal EF   Gallstones 02/2021   on [place]   GERD (gastroesophageal reflux disease)    diet controlled   Glaucoma, narrow-angle    s/p laser surgery   History of hiatal hernia    during endoscopy   Hypothyroid 03/2007   IBS (irritable bowel syndrome)    Dr. Benson [place]   Ischemic colitis ([place]) 11/21/2018   no current problems per patient on 12/23/42   Lichenoid keratosis 31/54/0086   Dr.Stinehelfer   Ocular migraine    Osteoporosis 04/2010   Dr.Hawkes; later consulted Dr. Buddy Duty  (2022)   Panic attack    Parkinson disease ([place])    Parkinson's disease ([place]) 06/23/2016   Recurrent UTI    has cystocele-Dr.Grewal   Shingles 1999   h/o   Superficial thrombophlebitis 03/2009   RLE   Trochanteric bursitis 12/2008   bilateral   Past Surgical History:  Procedure Laterality Date   ABDOMINAL HYSTERECTOMY     BACK SURGERY  05/2019   CATARACT EXTRACTION, BILATERAL  1995, 1996   EYE SURGERY Bilateral    laser - glaucoma   Flexible sigmoidoscopy     KYPHOPLASTY N/A 04/03/2019   Procedure: KYPHOPLASTY L1;  Surgeon: Melina Schools, MD;  Location: [place];  Service: Orthopedics;  Laterality: N/A;  90 mins   THYROIDECTOMY, PARTIAL  09/2005   L nodule; Dr. Harlow Asa   TONSILLECTOMY  1946   UPPER GI ENDOSCOPY  06/27/2012   VAGINAL HYSTERECTOMY  1971   and bladder repair.  Still has ovaries   WISDOM TOOTH EXTRACTION     Patient Active Problem List   Diagnosis Date Noted   Senile purpura ([place]) 11/04/2021   Calculus of gallbladder without cholecystitis without obstruction 11/01/2021   Rash 03/18/2021   Herpes zoster without complication 76/19/5093  Multiple allergies 03/18/2021   Frail elderly 03/18/2021   LUQ pain 10/29/2019   Poor appetite 10/29/2019   Loss of weight 10/29/2019   Nausea and vomiting 10/29/2019   Altered bowel habits 10/29/2019   Lumbar compression fracture ([place]) 04/03/2019   Compression fracture of L1 lumbar vertebra (HCC) 03/16/2019   Osteoporosis of multiple sites 03/16/2019   Essential tremor 02/06/2019   Ischemic colitis ([place])    Leukocytosis    Acute colitis 11/22/2018   Colitis 11/22/2018   Acute lower UTI 11/22/2018   Acute cystitis without hematuria    Parkinson's disease ([place]) 10/24/2018   Gait disturbance 11/28/2017   Chronic lymphocytic thyroiditis 04/24/2017   Status post removal of thyroid nodule 04/24/2017   Depression with anxiety 03/16/2017   Subcutaneous nodules 03/16/2017   Aortic atherosclerosis ([place]) 11/24/2016   Allergy  to multiple antibiotics 10/07/2016   Seafood allergy, anaphylaxis, subsequent encounter 11/94/1740   Helicobacter pylori gastritis 10/07/2016   Fall 09/05/2016   Bloating 08/15/2016   Severe recurrent major depression without psychotic features ([place]) 04/13/2016    Class: Chronic   Rash and nonspecific skin eruption 03/01/2016   Leg pain, bilateral 03/01/2016   Functional dyspepsia 10/13/2015   Subacromial bursitis 03/02/2015   Resting tremor 02/01/2015   Epigastric fullness 10/12/2014   Early satiety 10/12/2014   Cystocele 12/30/2013   IBS (irritable bowel syndrome) 09/30/2013   GERD (gastroesophageal reflux disease) 06/27/2013   PVC's (premature ventricular contractions) 10/02/2012   Depressive disorder, not elsewhere classified 10/02/2012   Bradycardia 08/01/2012   Fatigue 08/01/2012   Anxiety state 05/13/2012   Osteopenia 05/13/2012   Hypothyroidism 05/13/2012    REFERRING DIAG:  M54.42,G89.29 (ICD-10-CM) - Chronic left-sided low back pain with left-sided sciatica  M51.36 (ICD-10-CM) - DDD (degenerative disc disease), lumbar  M16.12 (ICD-10-CM) - Primary osteoarthritis of left hip    THERAPY DIAG:  Other low back pain  Muscle weakness (generalized)  Pain in left hip  PERTINENT HISTORY: Parkinson's disease; L1 compression fracture 2020; osteoporosis; had nerve blocks in past and fell on left side; Osteoporosis;  fibromyalgia;  married 35 years; negative experience with previous PT  PRECAUTIONS: osteoporosis   SUBJECTIVE: The belt that I use to float at the pool makes my waist hurt for 2 days after.  I am 60% better.    PAIN:  PAIN:  Are you having pain? Yes NPRS scale: 5/10 Pain location: bil gluteals and lateral legs  Pain orientation: Bilateral  PAIN TYPE: aching Pain description: constant  Aggravating factors: when I sleep, using my cane, activity Relieving factors: laying down   OBJECTIVE: from evaluation on 10/27/21   DIAGNOSTIC FINDINGS:  Not  recently   PATIENT SURVEYS:  FOTO 45%                  SENSATION: WFL to light touch    POSTURE:  Decreased lumbar lordosis, increased thoracic kyphosis; right shoulder elevated in sitting and standing with slight right shift;  hand tremor   PALPATION: Tender points in bil gluteals and lumbar paraspinals;  decreased thoraco lumbar fascial mobility   LUMBAR ROM:  Uses partial squat to pick up items from floor;  unable to tolerate supine lying but OK with right sidelying    Active  A/PROM  10/27/21  Flexion Not tested secondary to osteoporosis  Extension 0  Right lateral flexion    Left lateral flexion    Right rotation    Left rotation     (Blank rows = not tested) Trunk  strength: decreased trunk extensor strength 4/5 and decreased activation of lower abdominals  LE ROM:  WFLs, right and left knee pain with full extension LE MMT:  Able to rise from standard chair without UE assist    LUMBAR SPECIAL TESTS:  Slump test: Negative   GAIT: Distance walked: 80 feet Assistive device utilized: Single point cane Level of assistance: Complete Independence Comments: shorter step length     TODAY'S TREATMENT  Treatment on date: 12/14/21 Seated marching: 2x10 Long arc quads: 2x10- 5" hold Biceps curls and shoulder flexion 1# 2x10 bil each NuStep: level 1 x 8  minutes-PT present to discuss progress Seated hamstring stretch 2x20 seconds  Manual: elongation and release to Rt gluteals and quadratus  12/07/21: Pt arrives for aquatic physical therapy. Treatment took place in 3.5-5.5 feet of water. Water temperature was 91 degrees F.. Pt entered the pool via stairs with moderate use of rails. Pt requires buoyancy of water for support and to offload joints with strengthening exercises.   Seated water bench with 75% submersion Pt performed seated LE AROM exercises 20x in all planes, TC to help pt stay seated. Pain assessment concurrent.  75% depth leaning against the bench initially then  progressing to vertical stance: shoulder flex/ext 10x, blue noodle trunk rotation to the RT for gentle lower Lt stretch 3x10: forward walking 46lengths 2x holding large noodle for back support. Horizontal decompression float with 100% flotation and PTA providing lateral sway to mobilize trunk.  Treatment on date: 12/05/21 Seated marching: 2x10 Long arc quads: 2x10- 5" hold Biceps curls and shoulder flexion 1# 2x10 bil each NuStep: level 1 x 8  minutes-PT present to discuss progress Seated hamstring stretch 2x20 seconds  Manual: elongation and release to Lt gluteals and quadratus  PATIENT EDUCATION:  Education details: [id number], aquatic info (11/22/21) Person educated: Patient Education method: Explanation Education comprehension: verbalized understanding     HOME EXERCISE PROGRAM: Access Code: [email or web address] URL: [email or web address] Date: 11/09/2021 Prepared by: Claiborne Billings  Exercises - Seated Hamstring Stretch  - 3 x daily - 7 x weekly - 1 sets - 3 reps - 20 hold - Seated Figure 4 Piriformis Stretch  - 1 x daily - 7 x weekly - 1 sets - 3 reps - 20 hold - Seated Hip Adduction Isometrics with Ball  - 2 x daily - 7 x weekly - 1 sets - 10 reps - 5 hold - Seated Hip Abduction with Resistance  - 2 x daily - 7 x weekly - 1 sets - 10 reps   ASSESSMENT:   CLINICAL IMPRESSION: Pt reports 60-65% overall improvement in pain since the start of care.  Pt is doing well with aquatics 1x/wk.  Pt with increased ability to walk longer distances with less pain.  Pt is sleeping better overall.  Pt with significant tension in Lt gluteals and quadratus and responded well to manual  therapy.  PT monitored pt for pain and technique with exercise today.  Patient will benefit from skilled PT to address the below impairments and improve overall function.     OBJECTIVE IMPAIRMENTS difficulty walking, decreased strength, increased fascial restrictions, and pain.    ACTIVITY LIMITATIONS cleaning, meal prep,  and shopping.    PERSONAL FACTORS Age, Past/current experiences, Time since onset of injury/illness/exacerbation, and 1 comorbidity: Parkinson's, fibromyalgia, osteoporosis   are also affecting patient's functional outcome.      REHAB POTENTIAL: Good   CLINICAL DECISION MAKING: Stable/uncomplicated   EVALUATION COMPLEXITY: Low     GOALS: Goals reviewed  with patient? Yes   SHORT TERM GOALS: Target date: 11/24/2021   The patient will demonstrate knowledge of basic self care strategies and exercises to promote healing and pain relief Baseline: Goal status: MET   2.  The patient will report a 30% improvement in pain levels with functional activities which are currently difficult including standing and walking for shopping Baseline: pt has had some painfree days after PT (12/05/21) Goal status: in progress    3.  The patient will be able to walk in the grocery store 15 minutes with the shopping cart without major exacerbation of pain Baseline:  increased pain with this Goal status: In progress        LONG TERM GOALS: Target date: 12/22/2021   The patient will be independent in a safe self progression of a home exercise program to promote further recovery of function   Baseline:  Goal status: In progress    2.  The patient will report a 65% improvement in pain levels with functional activities which are currently difficult including standing and walking Baseline: 60% (12/14/21) Goal status: In progress   3.  The patient will be able to stand for light meal prep 8-10 min without exacerbation of pain Baseline:  able to stand for up to 15 min Goal status: MET *(12/05/21)   4.   FOTO score improved from 45% to 52% indicating improved function with less pain Baseline:  Goal status: INITIAL         PLAN: PT FREQUENCY: 2x/week   PT DURATION: 8 weeks   PLANNED INTERVENTIONS: Therapeutic exercises, Therapeutic activity, Neuromuscular re-education, Balance training, Gait training,  Patient/Family education, Joint mobilization, Aquatic Therapy, Dry Needling, Electrical stimulation, Moist heat, Taping, Ultrasound, Ionotophoresis 45m/ml Dexamethasone, and Manual therapy.   PLAN FOR NEXT SESSION:  ERO next week before MD appt on 12/21/21.  Continue to advance activity as able.  Manual to address pain.   KSigurd Sos PT 12/14/21 11:11 AM   [facility] [address] [place] [address]Phone # [phone] [phone]

## 2021-12-15 ENCOUNTER — Encounter: Payer: Self-pay | Admitting: Adult Health

## 2021-12-15 ENCOUNTER — Ambulatory Visit (INDEPENDENT_AMBULATORY_CARE_PROVIDER_SITE_OTHER): Payer: Medicare Other | Admitting: Adult Health

## 2021-12-15 ENCOUNTER — Telehealth: Payer: Self-pay | Admitting: Neurology

## 2021-12-15 DIAGNOSIS — F410 Panic disorder [episodic paroxysmal anxiety] without agoraphobia: Secondary | ICD-10-CM | POA: Diagnosis not present

## 2021-12-15 DIAGNOSIS — G4700 Insomnia, unspecified: Secondary | ICD-10-CM | POA: Diagnosis not present

## 2021-12-15 DIAGNOSIS — F411 Generalized anxiety disorder: Secondary | ICD-10-CM

## 2021-12-15 DIAGNOSIS — F331 Major depressive disorder, recurrent, moderate: Secondary | ICD-10-CM | POA: Diagnosis not present

## 2021-12-15 MED ORDER — FLUOXETINE HCL 10 MG PO CAPS: 10.0000 mg | ORAL_CAPSULE | Freq: Two times a day (BID) | ORAL | 5 refills | Status: DC

## 2021-12-15 NOTE — Progress Notes (Signed)
Tracy Malone [id number] May 11, 1932 88 y.o.  Subjective:   Patient ID:  Tracy Malone is a 88 y.o. (DOB [birth year]) female.  Chief Complaint: No chief complaint on file.   HPI Tracy Malone presents to the office today for follow-up of MDD, GAD, panic attacks and insomnia.  Describes mood today as "not the best". Pleasant. Tearful - "at times". Mood symptoms - reports increased depression and anxiety. Increased irritability at times. Reports increased worry and rumination. Denies recent panic attacks. Stating "I'm feeling depressed". Stopped the Prozac a few weeks ago and mood has declined - "I need to get back on it". Plans to restart dose at 10mg  for a few weeks, then increase to 20mg  daily. Improved interest and motivation. Taking medications as prescribed. Seeing therapist - Rockne Menghini. Energy levels lower. Using a rolling walker today. Active, does not have a regular exercise routine. Enjoys some usual interests and activities. Married. Lives with husband - 2 children 82 and 56. Son lives in [place]. Spending time with family. Appetite adequate. Weight gain - 97.5 Sleeping difficulties. Averages 6 hours - then up and down during the night. Reports some daytime napping. Focus and concentration stable. Completing tasks. Managing aspects of household. Retired.  Denies SI or HI.  Denies AH or VH.  Previous medication trials: Multiple medication trials    PHQ2-9    Flowsheet Row Office Visit from 11/02/2021 in [facility] Office Visit from 03/24/2021 in [facility] Office Visit from 09/08/2020 in [facility] Office Visit from 09/03/2019 in [facility] Patient Outreach Telephone from 12/02/2018 in [facility]  PHQ-2 Total Score 6 6 6 6 1   PHQ-9 Total Score 18 17 7 17  --      Flowsheet Row ED from 03/07/2021 in [facility]  C-SSRS RISK CATEGORY No Risk        Review of Systems:  Review of  Systems  Musculoskeletal:  Negative for gait problem.  Neurological:  Negative for tremors.  Psychiatric/Behavioral:         Please refer to HPI   Medications: I have reviewed the patient's current medications.  Current Outpatient Medications  Medication Sig Dispense Refill   FLUoxetine (PROZAC) 10 MG capsule Take 1 capsule (10 mg total) by mouth 2 (two) times daily. 60 capsule 5   ALPRAZolam (XANAX) 0.5 MG tablet Take four tablets daily as needed for anxiety. 120 tablet 2   b complex vitamins tablet Take 1 tablet by mouth daily.     Calcium Citrate-Vitamin D (CALCIUM CITRATE +D PO) Take 1 tablet by mouth in the morning and at bedtime. Taking 400 mg of calcium and 500 units of vitamin D twice daily (total 800 mg calcium and 1000 units of vitamin D)     Carbidopa-Levodopa ER (SINEMET CR) 25-100 MG tablet controlled release TAKE ONE TABLET BY MOUTH EVERY MORNING; 1 TABLET AT NOON; AND 1 TABLET AT BEDTIME 270 tablet 1   dicyclomine (BENTYL) 10 MG capsule Take 1 capsule (10 mg total) by mouth every 6 (six) hours as needed. 30 capsule 0   Probiotic Product (ALIGN PO) Take 1 capsule by mouth daily.     SYNTHROID 25 MCG tablet TAKE 1 TABLET DAILY BEFORE BREAKFAST FOR HYPOTHYROIDISM 90 tablet 0   No current facility-administered medications for this visit.    Medication Side Effects: None  Allergies:  Allergies  Allergen Reactions   Iodinated Contrast Media Anaphylaxis   Iodine Anaphylaxis    IV and  topical forms. Other reaction(s): Unknown   Levsin [Hyoscyamine Sulfate]     Vision problems/pt has glaucoma   Salmon [Fish Allergy] Hives and Shortness Of Breath   Shellfish Allergy Anaphylaxis   Tramadol Swelling   Remeron [Mirtazapine] Other (See Comments)    Cause blurred vision and red eyes, pt has glaucoma   Aspirin Other (See Comments)    Sever stomach pain due to ulcer scaring.   Bis Subcit-Metronid-Tetracyc Swelling    Tongue swelling. Face tingling Other reaction(s): Unknown    Cephalexin Hives    Other reaction(s): hives   Ciprofloxacin Diarrhea   Codeine Nausea And Vomiting   Cyclobenzaprine Other (See Comments)    Tingly/prickly sensation. Other reaction(s): tingly/prickly sensation   Darvocet [Propoxyphene N-Acetaminophen] Nausea And Vomiting   Demerol [Meperidine] Nausea Only   Dexlansoprazole Swelling    Redness, swelling and peeling of both feet. Other reaction(s): foot pain   Diphedryl [Diphenhydramine] Other (See Comments)    Increased pulse/small amount ok   Doxycycline Hyclate Other (See Comments)    GI intolerance.   Doxycycline Hyclate     Other reaction(s): GI intolerance   Epinephrine Other (See Comments)    Breathing problems Other reaction(s): breathing problems/fainting   Erythromycin Other (See Comments)    GI intolerance. Other reaction(s): GI   Fish Oil     Other reaction(s): breathing problems/hives   Hyoscyamine     Other reaction(s): eye pain   Latex Other (See Comments)    Gloves ok.  Skin gets red from elastic in underwear and latex bandaides.   Meperidine Hcl     Other reaction(s): vomiting   Nitrofurantoin Diarrhea   Other     Other reaction(s): migraines Other reaction(s): Unknown Other reaction(s): Unknown Other reaction(s): Unknown Other reaction(s): increased pulse, faint, diarrhea   Prednisone Other (See Comments)    Headache Other reaction(s): headache   Ra Diphedryl Allergy [Diphenhydramine Hcl]     Other reaction(s): increased pulse small dose okay   Sertraline Hcl Swelling and Other (See Comments)    Migraine Swelling of tongue/lip (09/2012) Other reaction(s): Unknown   Shellfish-Derived Products     Other reaction(s): Unknown   Sulfa Antibiotics Other (See Comments)    Increased pulse, fainting, diarrhea, thrush   Wellbutrin [Bupropion] Other (See Comments)    Headaches   Xylocaine [Lidocaine Hcl]     With epinephrine, given by dentist.  Speeded up heart rate and she passed out (occured twice, at  dentist)   Xylocaine [Lidocaine]     Other reaction(s): Unknown   Biaxin [Clarithromycin] Rash    Started after completing 10 day course of 2000 mg /day, Lips swelling   Ibuprofen Other (See Comments)    Motrin ok with a GI effect. Other reaction(s): rash Motrin okay with a GI effect    Past Medical History:  Diagnosis Date   Bell's palsy 1966   Hx: right side facial droop, resolved per patient 04/02/19   Carotid artery disease (HCC) 2010   on vascular screening;unchanged 2013.(could not tolerate simvastatin, no other statins tried)--<30% blockage bilat 07/2011   Chronic abdominal pain    Chronic fatigue and malaise    Claustrophobia    Cyst of left ovary    last imaging 06/2021, benign, no further f/u   Depression    treated in the past for years;stopped in 2010 for a years   Duodenal ulcer 1962   h/o   Dysrhythmia    ocassional PVC's, no current problems per patient on 04/02/19  Fibromyalgia    Frequent PVCs 07/2012   Seen by [facility]: benign, asymptomatic, normal EF   Gallstones 02/2021   on [place]   GERD (gastroesophageal reflux disease)    diet controlled   Glaucoma, narrow-angle    s/p laser surgery   History of hiatal hernia    during endoscopy   Hypothyroid 03/2007   IBS (irritable bowel syndrome)    Dr. Elnoria Howard   Ischemic colitis (HCC) 11/21/2018   no current problems per patient on 04/02/19   Lichenoid keratosis 12/03/2020   Dr.Stinehelfer   Ocular migraine    Osteoporosis 04/2010   Dr.Hawkes; later consulted Dr. Sharl Ma (2022)   Panic attack    Parkinson disease (HCC)    Parkinson's disease (HCC) 06/23/2016   Recurrent UTI    has cystocele-Dr.Grewal   Shingles 1999   h/o   Superficial thrombophlebitis 03/2009   RLE   Trochanteric bursitis 12/2008   bilateral    Past Medical History, Surgical history, Social history, and Family history were reviewed and updated as appropriate.   Please see review of systems for further details on the patient's review from  today.   Objective:   Physical Exam:  LMP  (LMP Unknown)   Physical Exam  Lab Review:     Component Value Date/Time   NA 142 11/02/2021 1117   K 4.1 11/02/2021 1117   CL 104 11/02/2021 1117   CO2 26 11/02/2021 1117   GLUCOSE 89 11/02/2021 1117   GLUCOSE 147 (H) 09/23/2021 1318   BUN 12 11/02/2021 1117   CREATININE 0.64 11/02/2021 1117   CREATININE 0.75 10/22/2018 1401   CREATININE 0.67 11/23/2016 0815   CALCIUM 10.0 11/02/2021 1117   PROT 6.4 11/02/2021 1117   ALBUMIN 4.4 11/02/2021 1117   AST 23 11/02/2021 1117   AST 17 10/22/2018 1401   ALT 4 11/02/2021 1117   ALT 7 10/22/2018 1401   ALKPHOS 69 11/02/2021 1117   BILITOT 1.3 (H) 11/02/2021 1117   BILITOT 0.8 10/22/2018 1401   GFRNONAA >60 03/07/2021 0755   GFRNONAA >60 10/22/2018 1401   GFRAA >60 03/16/2020 1153   GFRAA >60 10/22/2018 1401       Component Value Date/Time   WBC 6.1 09/23/2021 1318   RBC 4.21 09/23/2021 1318   HGB 12.8 09/23/2021 1318   HGB 13.4 03/10/2021 1547   HCT 38.5 09/23/2021 1318   HCT 39.5 03/10/2021 1547   PLT 261.0 09/23/2021 1318   PLT 266 03/10/2021 1547   MCV 91.5 09/23/2021 1318   MCV 89 03/10/2021 1547   MCH 30.1 03/10/2021 1547   MCH 30.4 03/07/2021 0755   MCHC 33.3 09/23/2021 1318   RDW 13.8 09/23/2021 1318   RDW 12.2 03/10/2021 1547   LYMPHSABS 1.8 09/23/2021 1318   LYMPHSABS 1.4 03/10/2021 1547   MONOABS 0.5 09/23/2021 1318   EOSABS 0.1 09/23/2021 1318   EOSABS 0.1 03/10/2021 1547   BASOSABS 0.1 09/23/2021 1318   BASOSABS 0.0 03/10/2021 1547    No results found for: POCLITH, LITHIUM   No results found for: PHENYTOIN, PHENOBARB, VALPROATE, CBMZ   .res Assessment: Plan:    Diagnoses and all orders for this visit:  Generalized anxiety disorder -     FLUoxetine (PROZAC) 10 MG capsule; Take 1 capsule (10 mg total) by mouth 2 (two) times daily.  Major depressive disorder, recurrent episode, moderate (HCC)  Panic attacks  Insomnia, unspecified type      Please see After Visit Summary for patient specific instructions.  Future Appointments  Date Time Provider Department Center  12/16/2021 12:15 PM Manfred Shirts, PTA [facility] None  12/21/2021 12:30 PM Edrick Oh, PT [facility] None  12/21/2021  2:30 PM Richardean Sale, DO [facility] None  12/23/2021 12:15 PM Manfred Shirts, PTA [facility] None  12/26/2021  2:00 PM Manfred Shirts, PTA [facility] None  12/28/2021  9:30 AM Manfred Shirts, PTA [facility] None  01/02/2022  2:00 PM Manfred Shirts, PTA [facility] None  01/03/2022 11:00 AM Mathis Fare, LCSW [facility] None  01/06/2022 12:15 PM Manfred Shirts, PTA [facility] None  01/09/2022  2:00 PM Edrick Oh, PT [facility] None  01/16/2022  2:00 PM Edrick Oh, PT [facility] None  01/17/2022 11:00 AM Mathis Fare, LCSW [facility] None  01/20/2022  1:45 PM Manfred Shirts, PTA [facility] None  01/23/2022  2:00 PM Edrick Oh, PT [facility] None  01/25/2022 11:20 AM Chai Routh, Thereasa Solo, NP [facility] None  01/27/2022 12:15 PM Manfred Shirts, PTA [facility] None  01/31/2022 11:00 AM Mathis Fare, LCSW [facility] None  02/14/2022 11:00 AM Mathis Fare, LCSW [facility] None  07/04/2022  3:30 PM Sater, Pearletha Furl, MD [facility] None    No orders of the defined types were placed in this encounter.   -------------------------------

## 2021-12-15 NOTE — Telephone Encounter (Signed)
Referral for Neurology sent to [facility] [phone]. 

## 2021-12-16 ENCOUNTER — Ambulatory Visit: Payer: Medicare Other | Admitting: Physical Therapy

## 2021-12-16 NOTE — Therapy (Unsigned)
OUTPATIENT PHYSICAL THERAPY TREATMENT NOTE   Patient Name: Tracy Malone MRN: [id number] DOB:Sep 15, 1931, 88 y.o., female Today's Date: 12/16/2021  PCP: [name], MD REFERRING PROVIDER: Glennon Mac, DO Progress Note Reporting Period 10/27/21 to 12/05/21  See note below for Objective Data and Assessment of Progress/Goals.     END OF SESSION:              Past Medical History:  Diagnosis Date   Bell's palsy 1966   Hx: right side facial droop, resolved per patient 04/02/19   Carotid artery disease ([place]) 2010   on vascular screening;unchanged 2013.(could not tolerate simvastatin, no other statins tried)--<30% blockage bilat 07/2011   Chronic abdominal pain    Chronic fatigue and malaise    Claustrophobia    Cyst of left ovary    last imaging 06/2021, benign, no further f/u   Depression    treated in the past for years;stopped in 2010 for a years   Duodenal ulcer 1962   h/o   Dysrhythmia    ocassional PVC's, no current problems per patient on 04/02/19   Fibromyalgia    Frequent PVCs 07/2012   Seen by [facility]: benign, asymptomatic, normal EF   Gallstones 02/2021   on [place]   GERD (gastroesophageal reflux disease)    diet controlled   Glaucoma, narrow-angle    s/p laser surgery   History of hiatal hernia    during endoscopy   Hypothyroid 03/2007   IBS (irritable bowel syndrome)    Dr. Benson [place]   Ischemic colitis ([place]) 11/21/2018   no current problems per patient on 6/0/10   Lichenoid keratosis 93/23/5573   Dr.Stinehelfer   Ocular migraine    Osteoporosis 04/2010   Dr.Hawkes; later consulted Dr. Buddy Duty (2022)   Panic attack    Parkinson disease ([place])    Parkinson's disease ([place]) 06/23/2016   Recurrent UTI    has cystocele-Dr.Grewal   Shingles 1999   h/o   Superficial thrombophlebitis 03/2009   RLE   Trochanteric bursitis 12/2008   bilateral   Past Surgical History:  Procedure Laterality Date   ABDOMINAL HYSTERECTOMY     BACK SURGERY  05/2019    CATARACT EXTRACTION, BILATERAL  1995, 1996   EYE SURGERY Bilateral    laser - glaucoma   Flexible sigmoidoscopy     KYPHOPLASTY N/A 04/03/2019   Procedure: KYPHOPLASTY L1;  Surgeon: Melina Schools, MD;  Location: [facility];  Service: Orthopedics;  Laterality: N/A;  90 mins   THYROIDECTOMY, PARTIAL  09/2005   L nodule; Dr. Harlow Asa   TONSILLECTOMY  1946   UPPER GI ENDOSCOPY  06/27/2012   VAGINAL HYSTERECTOMY  1971   and bladder repair.  Still has ovaries   WISDOM TOOTH EXTRACTION     Patient Active Problem List   Diagnosis Date Noted   Senile purpura ([place]) 11/04/2021   Calculus of gallbladder without cholecystitis without obstruction 11/01/2021   Rash 03/18/2021   Herpes zoster without complication 22/08/5425   Multiple allergies 03/18/2021   Frail elderly 03/18/2021   LUQ pain 10/29/2019   Poor appetite 10/29/2019   Loss of weight 10/29/2019   Nausea and vomiting 10/29/2019   Altered bowel habits 10/29/2019   Lumbar compression fracture ([place]) 04/03/2019   Compression fracture of L1 lumbar vertebra ([place]) 03/16/2019   Osteoporosis of multiple sites 03/16/2019   Essential tremor 02/06/2019   Ischemic colitis ([place])    Leukocytosis    Acute colitis 11/22/2018   Colitis 11/22/2018   Acute lower UTI  11/22/2018   Acute cystitis without hematuria    Parkinson's disease ([place]) 10/24/2018   Gait disturbance 11/28/2017   Chronic lymphocytic thyroiditis 04/24/2017   Status post removal of thyroid nodule 04/24/2017   Depression with anxiety 03/16/2017   Subcutaneous nodules 03/16/2017   Aortic atherosclerosis ([place]) 11/24/2016   Allergy to multiple antibiotics 10/07/2016   Seafood allergy, anaphylaxis, subsequent encounter 96/78/9381   Helicobacter pylori gastritis 10/07/2016   Fall 09/05/2016   Bloating 08/15/2016   Severe recurrent major depression without psychotic features ([place]) 04/13/2016    Class: Chronic   Rash and nonspecific skin eruption 03/01/2016   Leg pain, bilateral  03/01/2016   Functional dyspepsia 10/13/2015   Subacromial bursitis 03/02/2015   Resting tremor 02/01/2015   Epigastric fullness 10/12/2014   Early satiety 10/12/2014   Cystocele 12/30/2013   IBS (irritable bowel syndrome) 09/30/2013   GERD (gastroesophageal reflux disease) 06/27/2013   PVC's (premature ventricular contractions) 10/02/2012   Depressive disorder, not elsewhere classified 10/02/2012   Bradycardia 08/01/2012   Fatigue 08/01/2012   Anxiety state 05/13/2012   Osteopenia 05/13/2012   Hypothyroidism 05/13/2012    REFERRING DIAG:  M54.42,G89.29 (ICD-10-CM) - Chronic left-sided low back pain with left-sided sciatica  M51.36 (ICD-10-CM) - DDD (degenerative disc disease), lumbar  M16.12 (ICD-10-CM) - Primary osteoarthritis of left hip    THERAPY DIAG:  Other low back pain  Muscle weakness (generalized)  Pain in left hip  PERTINENT HISTORY: Parkinson's disease; L1 compression fracture 2020; osteoporosis; had nerve blocks in past and fell on left side; Osteoporosis;  fibromyalgia;  married 2 years; negative experience with previous PT  PRECAUTIONS: osteoporosis   SUBJECTIVE: The belt that I use to float at the pool makes my waist hurt for 2 days after.  I am 60% better.    PAIN:  PAIN:  Are you having pain? Yes NPRS scale: 5/10 Pain location: bil gluteals and lateral legs  Pain orientation: Bilateral  PAIN TYPE: aching Pain description: constant  Aggravating factors: when I sleep, using my cane, activity Relieving factors: laying down   OBJECTIVE: from evaluation on 10/27/21   DIAGNOSTIC FINDINGS:  Not recently   PATIENT SURVEYS:  FOTO 45%                  SENSATION: WFL to light touch    POSTURE:  Decreased lumbar lordosis, increased thoracic kyphosis; right shoulder elevated in sitting and standing with slight right shift;  hand tremor   PALPATION: Tender points in bil gluteals and lumbar paraspinals;  decreased thoraco lumbar fascial mobility    LUMBAR ROM:  Uses partial squat to pick up items from floor;  unable to tolerate supine lying but OK with right sidelying    Active  A/PROM  10/27/21  Flexion Not tested secondary to osteoporosis  Extension 0  Right lateral flexion    Left lateral flexion    Right rotation    Left rotation     (Blank rows = not tested) Trunk strength: decreased trunk extensor strength 4/5 and decreased activation of lower abdominals  LE ROM:  WFLs, right and left knee pain with full extension LE MMT:  Able to rise from standard chair without UE assist    LUMBAR SPECIAL TESTS:  Slump test: Negative   GAIT: Distance walked: 80 feet Assistive device utilized: Single point cane Level of assistance: Complete Independence Comments: shorter step length     TODAY'S TREATMENT   12/16/21: Pt arrives for aquatic physical therapy. Treatment took place in 3.5-5.5  feet of water. Water temperature was 91 degrees F.. Pt entered the pool via stairs with moderate use of rails. Pt requires buoyancy of water for support and to offload joints with strengthening exercises.   Seated water bench with 75% submersion Pt performed seated LE AROM exercises 20x in all planes, TC to help pt stay seated. Pain assessment concurrent.  Treatment on date: 12/14/21 Seated marching: 2x10 Long arc quads: 2x10- 5" hold Biceps curls and shoulder flexion 1# 2x10 bil each NuStep: level 1 x 8  minutes-PT present to discuss progress Seated hamstring stretch 2x20 seconds  Manual: elongation and release to Rt gluteals and quadratus  12/07/21: Pt arrives for aquatic physical therapy. Treatment took place in 3.5-5.5 feet of water. Water temperature was 91 degrees F.. Pt entered the pool via stairs with moderate use of rails. Pt requires buoyancy of water for support and to offload joints with strengthening exercises.   Seated water bench with 75% submersion Pt performed seated LE AROM exercises 20x in all planes, TC to help pt stay seated.  Pain assessment concurrent.  75% depth leaning against the bench initially then progressing to vertical stance: shoulder flex/ext 10x, blue noodle trunk rotation to the RT for gentle lower Lt stretch 3x10: forward walking 46lengths 2x holding large noodle for back support. Horizontal decompression float with 100% flotation and PTA providing lateral sway to mobilize trunk.  Treatment on date: 12/05/21 Seated marching: 2x10 Long arc quads: 2x10- 5" hold Biceps curls and shoulder flexion 1# 2x10 bil each NuStep: level 1 x 8  minutes-PT present to discuss progress Seated hamstring stretch 2x20 seconds  Manual: elongation and release to Lt gluteals and quadratus  PATIENT EDUCATION:  Education details: [id number], aquatic info (11/22/21) Person educated: Patient Education method: Explanation Education comprehension: verbalized understanding     HOME EXERCISE PROGRAM: Access Code: [email or web address] URL: [email or web address] Date: 11/09/2021 Prepared by: Claiborne Billings  Exercises - Seated Hamstring Stretch  - 3 x daily - 7 x weekly - 1 sets - 3 reps - 20 hold - Seated Figure 4 Piriformis Stretch  - 1 x daily - 7 x weekly - 1 sets - 3 reps - 20 hold - Seated Hip Adduction Isometrics with Ball  - 2 x daily - 7 x weekly - 1 sets - 10 reps - 5 hold - Seated Hip Abduction with Resistance  - 2 x daily - 7 x weekly - 1 sets - 10 reps   ASSESSMENT:   CLINICAL IMPRESSION: Pt reports 60-65% overall improvement in pain since the start of care.  Pt is doing well with aquatics 1x/wk.  Pt with increased ability to walk longer distances with less pain.  Pt is sleeping better overall.  Pt with significant tension in Lt gluteals and quadratus and responded well to manual  therapy.  PT monitored pt for pain and technique with exercise today.  Patient will benefit from skilled PT to address the below impairments and improve overall function.     OBJECTIVE IMPAIRMENTS difficulty walking, decreased strength,  increased fascial restrictions, and pain.    ACTIVITY LIMITATIONS cleaning, meal prep, and shopping.    PERSONAL FACTORS Age, Past/current experiences, Time since onset of injury/illness/exacerbation, and 1 comorbidity: Parkinson's, fibromyalgia, osteoporosis   are also affecting patient's functional outcome.      REHAB POTENTIAL: Good   CLINICAL DECISION MAKING: Stable/uncomplicated   EVALUATION COMPLEXITY: Low     GOALS: Goals reviewed with patient? Yes   SHORT TERM GOALS: Target date: 11/24/2021   The  patient will demonstrate knowledge of basic self care strategies and exercises to promote healing and pain relief Baseline: Goal status: MET   2.  The patient will report a 30% improvement in pain levels with functional activities which are currently difficult including standing and walking for shopping Baseline: pt has had some painfree days after PT (12/05/21) Goal status: in progress    3.  The patient will be able to walk in the grocery store 15 minutes with the shopping cart without major exacerbation of pain Baseline:  increased pain with this Goal status: In progress        LONG TERM GOALS: Target date: 12/22/2021   The patient will be independent in a safe self progression of a home exercise program to promote further recovery of function   Baseline:  Goal status: In progress    2.  The patient will report a 65% improvement in pain levels with functional activities which are currently difficult including standing and walking Baseline: 60% (12/14/21) Goal status: In progress   3.  The patient will be able to stand for light meal prep 8-10 min without exacerbation of pain Baseline:  able to stand for up to 15 min Goal status: MET *(12/05/21)   4.   FOTO score improved from 45% to 52% indicating improved function with less pain Baseline:  Goal status: INITIAL         PLAN: PT FREQUENCY: 2x/week   PT DURATION: 8 weeks   PLANNED INTERVENTIONS: Therapeutic  exercises, Therapeutic activity, Neuromuscular re-education, Balance training, Gait training, Patient/Family education, Joint mobilization, Aquatic Therapy, Dry Needling, Electrical stimulation, Moist heat, Taping, Ultrasound, Ionotophoresis 5m/ml Dexamethasone, and Manual therapy.   PLAN FOR NEXT SESSION:  ERO next week before MD appt on 12/21/21.  Continue to advance activity as able.  Manual to address pain.  JMyrene Galas PTA 12/16/21 10:58 AM   [facility] [address] [id number] [place] [address]Phone # [phone] [phone]

## 2021-12-20 ENCOUNTER — Ambulatory Visit: Payer: Medicare Other | Admitting: Psychiatry

## 2021-12-20 NOTE — Progress Notes (Unsigned)
    Tracy Malone D.[place] Phone: [phone]   Assessment and Plan:     There are no diagnoses linked to this encounter.  ***   Pertinent previous records reviewed include ***   Follow Up: ***     Subjective:   I, Tracy Malone, am serving as a Education administrator for Tracy Malone   Chief Complaint: left side back pain   HPI:  10/13/2021 Patient is a 88 year old female complaining of left side back pain. Patient states had surgery on back 2 years ago pcp thinks its a muscle pain got a nerve block and fell and fx her ribs a year ago, back pain has been bothering her for about 2 years from PT had a bad experience does get radiating pain down to her leg feels like a spasm cant sit for more than 30 mins laying Is awful standing isnt great either pain also radiate around to the front would like PT   12/21/2021 Patient states   Relevant Historical Information: History of L1 vertebral fracture, Parkinson's with essential tremor  Additional pertinent review of systems negative.   Current Outpatient Medications:    ALPRAZolam (XANAX) 0.5 MG tablet, Take four tablets daily as needed for anxiety., Disp: 120 tablet, Rfl: 2   b complex vitamins tablet, Take 1 tablet by mouth daily., Disp: , Rfl:    Calcium Citrate-Vitamin D (CALCIUM CITRATE +D PO), Take 1 tablet by mouth in the morning and at bedtime. Taking 400 mg of calcium and 500 units of vitamin D twice daily (total 800 mg calcium and 1000 units of vitamin D), Disp: , Rfl:    Carbidopa-Levodopa ER (SINEMET CR) 25-100 MG tablet controlled release, TAKE ONE TABLET BY MOUTH EVERY MORNING; 1 TABLET AT NOON; AND 1 TABLET AT BEDTIME, Disp: 270 tablet, Rfl: 1   dicyclomine (BENTYL) 10 MG capsule, Take 1 capsule (10 mg total) by mouth every 6 (six) hours as needed., Disp: 30 capsule, Rfl: 0   FLUoxetine (PROZAC) 10 MG capsule, Take 1 capsule (10 mg total) by mouth 2  (two) times daily., Disp: 60 capsule, Rfl: 5   Probiotic Product (ALIGN PO), Take 1 capsule by mouth daily., Disp: , Rfl:    SYNTHROID 25 MCG tablet, TAKE 1 TABLET DAILY BEFORE BREAKFAST FOR HYPOTHYROIDISM, Disp: 90 tablet, Rfl: 0   Objective:     There were no vitals filed for this visit.    There is no height or weight on file to calculate BMI.    Physical Exam:    ***   Electronically signed by:  Tracy Malone D.[facility] 8:06 AM 12/20/21

## 2021-12-21 ENCOUNTER — Ambulatory Visit: Payer: Medicare Other

## 2021-12-21 ENCOUNTER — Ambulatory Visit (INDEPENDENT_AMBULATORY_CARE_PROVIDER_SITE_OTHER): Payer: Medicare Other | Admitting: Sports Medicine

## 2021-12-21 VITALS — BP 110/78 | HR 40 | Ht 59.0 in | Wt 97.0 lb

## 2021-12-21 DIAGNOSIS — M5136 Other intervertebral disc degeneration, lumbar region: Secondary | ICD-10-CM

## 2021-12-21 DIAGNOSIS — M1612 Unilateral primary osteoarthritis, left hip: Secondary | ICD-10-CM

## 2021-12-21 DIAGNOSIS — M5442 Lumbago with sciatica, left side: Secondary | ICD-10-CM

## 2021-12-21 DIAGNOSIS — G8929 Other chronic pain: Secondary | ICD-10-CM

## 2021-12-21 DIAGNOSIS — M5459 Other low back pain: Secondary | ICD-10-CM

## 2021-12-21 DIAGNOSIS — M25552 Pain in left hip: Secondary | ICD-10-CM

## 2021-12-21 DIAGNOSIS — M6281 Muscle weakness (generalized): Secondary | ICD-10-CM | POA: Diagnosis not present

## 2021-12-21 NOTE — Patient Instructions (Addendum)
Good to see you  May use Tylenol [phone] mg 2 times a day for pain relief  Continue PT , aquatic therapy  May use heating pads over painful areas  As needed follow up  [employer]   TENS 7000 Digital TENS Unit with Accessories - TENS Unit Muscle Stimulator for Back Pain Relief, General Pain Relief, Neck Pain, Sciatica Pain Relief, Nerve Pain Relief

## 2021-12-21 NOTE — Therapy (Signed)
OUTPATIENT PHYSICAL THERAPY TREATMENT NOTE   Patient Name: Tracy Malone MRN: [id number] DOB:1932-05-24, 88 y.o., female Today's Date: 12/21/2021  PCP: [name], MD REFERRING PROVIDER: Glennon Mac, DO   END OF SESSION:   PT End of Session - 12/21/21 1243     Visit Number 12    Date for PT Re-Evaluation 02/03/22    Authorization Type Medicare- KX at 15    Progress Note Due on Visit 19    PT Start Time 1232    PT Stop Time 1310    PT Time Calculation (min) 38 min    Activity Tolerance Patient limited by pain    Behavior During Therapy [facility] for tasks assessed/performed                       Past Medical History:  Diagnosis Date   Bell's palsy 1966   Hx: right side facial droop, resolved per patient 04/02/19   Carotid artery disease ([place]) 2010   on vascular screening;unchanged 2013.(could not tolerate simvastatin, no other statins tried)--<30% blockage bilat 07/2011   Chronic abdominal pain    Chronic fatigue and malaise    Claustrophobia    Cyst of left ovary    last imaging 06/2021, benign, no further f/u   Depression    treated in the past for years;stopped in 2010 for a years   Duodenal ulcer 1962   h/o   Dysrhythmia    ocassional PVC's, no current problems per patient on 04/02/19   Fibromyalgia    Frequent PVCs 07/2012   Seen by [facility]: benign, asymptomatic, normal EF   Gallstones 02/2021   on [place]   GERD (gastroesophageal reflux disease)    diet controlled   Glaucoma, narrow-angle    s/p laser surgery   History of hiatal hernia    during endoscopy   Hypothyroid 03/2007   IBS (irritable bowel syndrome)    Dr. Benson [place]   Ischemic colitis ([place]) 11/21/2018   no current problems per patient on 12/24/67   Lichenoid keratosis 48/54/6270   Dr.Stinehelfer   Ocular migraine    Osteoporosis 04/2010   Dr.Hawkes; later consulted Dr. Buddy Duty (2022)   Panic attack    Parkinson disease ([place])    Parkinson's disease ([place]) 06/23/2016   Recurrent UTI     has cystocele-Dr.Grewal   Shingles 1999   h/o   Superficial thrombophlebitis 03/2009   RLE   Trochanteric bursitis 12/2008   bilateral   Past Surgical History:  Procedure Laterality Date   ABDOMINAL HYSTERECTOMY     BACK SURGERY  05/2019   CATARACT EXTRACTION, BILATERAL  1995, 1996   EYE SURGERY Bilateral    laser - glaucoma   Flexible sigmoidoscopy     KYPHOPLASTY N/A 04/03/2019   Procedure: KYPHOPLASTY L1;  Surgeon: Melina Schools, MD;  Location: [place];  Service: Orthopedics;  Laterality: N/A;  90 mins   THYROIDECTOMY, PARTIAL  09/2005   L nodule; Dr. Harlow Asa   TONSILLECTOMY  1946   UPPER GI ENDOSCOPY  06/27/2012   VAGINAL HYSTERECTOMY  1971   and bladder repair.  Still has ovaries   WISDOM TOOTH EXTRACTION     Patient Active Problem List   Diagnosis Date Noted   Senile purpura ([place]) 11/04/2021   Calculus of gallbladder without cholecystitis without obstruction 11/01/2021   Rash 03/18/2021   Herpes zoster without complication 35/00/9381   Multiple allergies 03/18/2021   Frail elderly 03/18/2021   LUQ pain 10/29/2019   Poor  appetite 10/29/2019   Loss of weight 10/29/2019   Nausea and vomiting 10/29/2019   Altered bowel habits 10/29/2019   Lumbar compression fracture ([place]) 04/03/2019   Compression fracture of L1 lumbar vertebra ([facility]) 03/16/2019   Osteoporosis of multiple sites 03/16/2019   Essential tremor 02/06/2019   Ischemic colitis ([place])    Leukocytosis    Acute colitis 11/22/2018   Colitis 11/22/2018   Acute lower UTI 11/22/2018   Acute cystitis without hematuria    Parkinson's disease ([place]) 10/24/2018   Gait disturbance 11/28/2017   Chronic lymphocytic thyroiditis 04/24/2017   Status post removal of thyroid nodule 04/24/2017   Depression with anxiety 03/16/2017   Subcutaneous nodules 03/16/2017   Aortic atherosclerosis ([place]) 11/24/2016   Allergy to multiple antibiotics 10/07/2016   Seafood allergy, anaphylaxis, subsequent encounter 04/54/0981    Helicobacter pylori gastritis 10/07/2016   Fall 09/05/2016   Bloating 08/15/2016   Severe recurrent major depression without psychotic features ([place]) 04/13/2016    Class: Chronic   Rash and nonspecific skin eruption 03/01/2016   Leg pain, bilateral 03/01/2016   Functional dyspepsia 10/13/2015   Subacromial bursitis 03/02/2015   Resting tremor 02/01/2015   Epigastric fullness 10/12/2014   Early satiety 10/12/2014   Cystocele 12/30/2013   IBS (irritable bowel syndrome) 09/30/2013   GERD (gastroesophageal reflux disease) 06/27/2013   PVC's (premature ventricular contractions) 10/02/2012   Depressive disorder, not elsewhere classified 10/02/2012   Bradycardia 08/01/2012   Fatigue 08/01/2012   Anxiety state 05/13/2012   Osteopenia 05/13/2012   Hypothyroidism 05/13/2012    REFERRING DIAG:  M54.42,G89.29 (ICD-10-CM) - Chronic left-sided low back pain with left-sided sciatica  M51.36 (ICD-10-CM) - DDD (degenerative disc disease), lumbar  M16.12 (ICD-10-CM) - Primary osteoarthritis of left hip    THERAPY DIAG:  Other low back pain - Plan: PT plan of care cert/re-cert  Muscle weakness (generalized) - Plan: PT plan of care cert/re-cert  Pain in left hip - Plan: PT plan of care cert/re-cert  PERTINENT HISTORY: Parkinson's disease; L1 compression fracture 2020; osteoporosis; had nerve blocks in past and fell on left side; Osteoporosis;  fibromyalgia;  married 46 years; negative experience with previous PT  PRECAUTIONS: osteoporosis   SUBJECTIVE: I think I pedaled my bike too much and I'm having a lot of pain today.  I am 60% better overall.    PAIN:  PAIN:  Are you having pain? Yes NPRS scale: 8/10 Pain location: bil gluteals and lateral legs  Pain orientation: Bilateral  PAIN TYPE: aching Pain description: constant  Aggravating factors: when I sleep, using my cane, activity Relieving factors: laying down   OBJECTIVE: from evaluation on 10/27/21   DIAGNOSTIC FINDINGS:  Not  recently   PATIENT SURVEYS:  FOTO 45%  12/21/21:  40 (pt is in a flare-up)                SENSATION: WFL to light touch    POSTURE:  Decreased lumbar lordosis, increased thoracic kyphosis; right shoulder elevated in sitting and standing with slight right shift;  hand tremor   PALPATION: Tender points in bil gluteals and lumbar paraspinals;  decreased thoraco lumbar fascial mobility   LUMBAR ROM:  Uses partial squat to pick up items from floor;  unable to tolerate supine lying but OK with right sidelying    Active  A/PROM  10/27/21  Flexion Not tested secondary to osteoporosis  Extension 0  Right lateral flexion    Left lateral flexion    Right rotation    Left  rotation     (Blank rows = not tested) Trunk strength: decreased trunk extensor strength 4/5 and decreased activation of lower abdominals  LE ROM:  WFLs, right and left knee pain with full extension LE MMT:  Able to rise from standard chair without UE assist    LUMBAR SPECIAL TESTS:  Slump test: Negative   GAIT: Distance walked: 80 feet Assistive device utilized: Single point cane Level of assistance: Complete Independence Comments: shorter step length     TODAY'S TREATMENT  12/21/21 NuStep Level 1x 3.5 min- stopped due to pain Goal assessment and re-cert performed Electrical stimulation and cold pack to Lt gluteals and quadratus x15 in in Rt sidelying  Treatment on date: 12/14/21 Seated marching: 2x10 Long arc quads: 2x10- 5" hold Biceps curls and shoulder flexion 1# 2x10 bil each NuStep: level 1 x 8  minutes-PT present to discuss progress Seated hamstring stretch 2x20 seconds  Manual: elongation and release to Rt gluteals and quadratus  12/07/21: Pt arrives for aquatic physical therapy. Treatment took place in 3.5-5.5 feet of water. Water temperature was 91 degrees F.. Pt entered the pool via stairs with moderate use of rails. Pt requires buoyancy of water for support and to offload joints with strengthening  exercises.   Seated water bench with 75% submersion Pt performed seated LE AROM exercises 20x in all planes, TC to help pt stay seated. Pain assessment concurrent.  75% depth leaning against the bench initially then progressing to vertical stance: shoulder flex/ext 10x, blue noodle trunk rotation to the RT for gentle lower Lt stretch 3x10: forward walking 46lengths 2x holding large noodle for back support. Horizontal decompression float with 100% flotation and PTA providing lateral sway to mobilize trunk.   PATIENT EDUCATION:  Education details: [id number], aquatic info (11/22/21) Person educated: Patient Education method: Explanation Education comprehension: verbalized understanding     HOME EXERCISE PROGRAM: Access Code: [email or web address] URL: [email or web address] Date: 11/09/2021 Prepared by: Claiborne Billings  Exercises - Seated Hamstring Stretch  - 3 x daily - 7 x weekly - 1 sets - 3 reps - 20 hold - Seated Figure 4 Piriformis Stretch  - 1 x daily - 7 x weekly - 1 sets - 3 reps - 20 hold - Seated Hip Adduction Isometrics with Ball  - 2 x daily - 7 x weekly - 1 sets - 10 reps - 5 hold - Seated Hip Abduction with Resistance  - 2 x daily - 7 x weekly - 1 sets - 10 reps   ASSESSMENT:   CLINICAL IMPRESSION: Pt arrived with increased pain today due to riding her floor bicycle too much yesterday. Pt reports 60-65% overall improvement in pain since the start of care although today is a bad day.  Pt is doing well with aquatics 1x/wk.  Pt with increased ability to walk longer distances with less pain.  Pt is sleeping better overall.  Session focused on re-assessment and pain management.  Pt with slow and inconsistent progress due to chronic nature of condition.  Patient will benefit from skilled PT to address the below impairments and improve overall function.     OBJECTIVE IMPAIRMENTS difficulty walking, decreased strength, increased fascial restrictions, and pain.    ACTIVITY LIMITATIONS  cleaning, meal prep, and shopping.    PERSONAL FACTORS Age, Past/current experiences, Time since onset of injury/illness/exacerbation, and 1 comorbidity: Parkinson's, fibromyalgia, osteoporosis   are also affecting patient's functional outcome.      REHAB POTENTIAL: Good   CLINICAL DECISION MAKING: Stable/uncomplicated   EVALUATION COMPLEXITY:  Low     GOALS: Goals reviewed with patient? Yes   SHORT TERM GOALS: Target date: 11/24/2021   The patient will demonstrate knowledge of basic self care strategies and exercises to promote healing and pain relief Baseline: Goal status: MET   2.  The patient will report a 30% improvement in pain levels with functional activities which are currently difficult including standing and walking for shopping Baseline: 60-65% (12/05/21) Goal status: MET   3.  The patient will be able to walk in the grocery store 15 minutes with the shopping cart without major exacerbation of pain Baseline:  30 minutes (12/21/21) Goal status: MET       LONG TERM GOALS: Target date: 02/03/22   The patient will be independent in a safe self progression of a home exercise program to promote further recovery of function   Baseline:  Goal status: In progress    2.  The patient will report a 75% improvement in pain levels with functional activities which are currently difficult including standing and walking Baseline: 60% (12/21/21) Goal status: Revised    3.  The patient will be able to stand for light meal prep 8-10 min without exacerbation of pain Baseline:  able to stand for up to 15 min Goal status: MET *(12/05/21)   4.   FOTO score improved from 45% to 52% indicating improved function with less pain Baseline: 40 Goal status: In progress         PLAN: PT FREQUENCY: 2x/week   PT DURATION: 8 weeks   PLANNED INTERVENTIONS: Therapeutic exercises, Therapeutic activity, Neuromuscular re-education, Balance training, Gait training, Patient/Family education, Joint  mobilization, Aquatic Therapy, Dry Needling, Electrical stimulation, Moist heat, Taping, Ultrasound, Ionotophoresis 24m/ml Dexamethasone, and Manual therapy.   PLAN FOR NEXT SESSION:    Continue to advance activity as able.  Manual to address pain. Continue aquatic PT  KSigurd Sos PT 12/21/21 1:02 PM   [facility] [address] [place] [address]Phone # [phone] [phone]

## 2021-12-23 ENCOUNTER — Encounter: Payer: Self-pay | Admitting: Physical Therapy

## 2021-12-23 ENCOUNTER — Ambulatory Visit: Payer: Medicare Other | Attending: Sports Medicine | Admitting: Physical Therapy

## 2021-12-23 DIAGNOSIS — M5459 Other low back pain: Secondary | ICD-10-CM | POA: Insufficient documentation

## 2021-12-23 DIAGNOSIS — M25552 Pain in left hip: Secondary | ICD-10-CM | POA: Diagnosis not present

## 2021-12-23 DIAGNOSIS — M6281 Muscle weakness (generalized): Secondary | ICD-10-CM | POA: Insufficient documentation

## 2021-12-23 NOTE — Therapy (Signed)
OUTPATIENT PHYSICAL THERAPY TREATMENT NOTE   Patient Name: Tracy Malone MRN: [id number] DOB:[birth year], 88 y.o., female Today's Date: 12/23/2021  PCP: [name], MD REFERRING PROVIDER: Glennon Mac, DO   END OF SESSION:   PT End of Session - 12/23/21 1212     Visit Number 13    Date for PT Re-Evaluation 02/03/22    Authorization Type Medicare- KX at 15    Progress Note Due on Visit 19    PT Start Time 1212    PT Stop Time 1254    PT Time Calculation (min) 42 min    Activity Tolerance Patient tolerated treatment well    Behavior During Therapy [facility] for tasks assessed/performed                        Past Medical History:  Diagnosis Date   Bell's palsy 1966   Hx: right side facial droop, resolved per patient 04/02/19   Carotid artery disease ([place]) 2010   on vascular screening;unchanged 2013.(could not tolerate simvastatin, no other statins tried)--<30% blockage bilat 07/2011   Chronic abdominal pain    Chronic fatigue and malaise    Claustrophobia    Cyst of left ovary    last imaging 06/2021, benign, no further f/u   Depression    treated in the past for years;stopped in 2010 for a years   Duodenal ulcer 1962   h/o   Dysrhythmia    ocassional PVC's, no current problems per patient on 04/02/19   Fibromyalgia    Frequent PVCs 07/2012   Seen by [facility]: benign, asymptomatic, normal EF   Gallstones 02/2021   on [place]   GERD (gastroesophageal reflux disease)    diet controlled   Glaucoma, narrow-angle    s/p laser surgery   History of hiatal hernia    during endoscopy   Hypothyroid 03/2007   IBS (irritable bowel syndrome)    Dr. Benson [place]   Ischemic colitis ([place]) 11/21/2018   no current problems per patient on 07/27/46   Lichenoid keratosis 18/56/3149   Dr.Stinehelfer   Ocular migraine    Osteoporosis 04/2010   Dr.Hawkes; later consulted Dr. Buddy Duty (2022)   Panic attack    Parkinson disease ([place])    Parkinson's disease ([place]) 06/23/2016   Recurrent  UTI    has cystocele-Dr.Grewal   Shingles 1999   h/o   Superficial thrombophlebitis 03/2009   RLE   Trochanteric bursitis 12/2008   bilateral   Past Surgical History:  Procedure Laterality Date   ABDOMINAL HYSTERECTOMY     BACK SURGERY  05/2019   CATARACT EXTRACTION, BILATERAL  1995, 1996   EYE SURGERY Bilateral    laser - glaucoma   Flexible sigmoidoscopy     KYPHOPLASTY N/A 04/03/2019   Procedure: KYPHOPLASTY L1;  Surgeon: Melina Schools, MD;  Location: [place];  Service: Orthopedics;  Laterality: N/A;  90 mins   THYROIDECTOMY, PARTIAL  09/2005   L nodule; Dr. Harlow Asa   TONSILLECTOMY  1946   UPPER GI ENDOSCOPY  06/27/2012   VAGINAL HYSTERECTOMY  1971   and bladder repair.  Still has ovaries   WISDOM TOOTH EXTRACTION     Patient Active Problem List   Diagnosis Date Noted   Senile purpura ([place]) 11/04/2021   Calculus of gallbladder without cholecystitis without obstruction 11/01/2021   Rash 03/18/2021   Herpes zoster without complication 70/26/3785   Multiple allergies 03/18/2021   Frail elderly 03/18/2021   LUQ pain 10/29/2019  Poor appetite 10/29/2019   Loss of weight 10/29/2019   Nausea and vomiting 10/29/2019   Altered bowel habits 10/29/2019   Lumbar compression fracture ([place]) 04/03/2019   Compression fracture of L1 lumbar vertebra ([facility]) 03/16/2019   Osteoporosis of multiple sites 03/16/2019   Essential tremor 02/06/2019   Ischemic colitis ([place])    Leukocytosis    Acute colitis 11/22/2018   Colitis 11/22/2018   Acute lower UTI 11/22/2018   Acute cystitis without hematuria    Parkinson's disease ([place]) 10/24/2018   Gait disturbance 11/28/2017   Chronic lymphocytic thyroiditis 04/24/2017   Status post removal of thyroid nodule 04/24/2017   Depression with anxiety 03/16/2017   Subcutaneous nodules 03/16/2017   Aortic atherosclerosis ([place]) 11/24/2016   Allergy to multiple antibiotics 10/07/2016   Seafood allergy, anaphylaxis, subsequent encounter 75/64/3329    Helicobacter pylori gastritis 10/07/2016   Fall 09/05/2016   Bloating 08/15/2016   Severe recurrent major depression without psychotic features ([place]) 04/13/2016    Class: Chronic   Rash and nonspecific skin eruption 03/01/2016   Leg pain, bilateral 03/01/2016   Functional dyspepsia 10/13/2015   Subacromial bursitis 03/02/2015   Resting tremor 02/01/2015   Epigastric fullness 10/12/2014   Early satiety 10/12/2014   Cystocele 12/30/2013   IBS (irritable bowel syndrome) 09/30/2013   GERD (gastroesophageal reflux disease) 06/27/2013   PVC's (premature ventricular contractions) 10/02/2012   Depressive disorder, not elsewhere classified 10/02/2012   Bradycardia 08/01/2012   Fatigue 08/01/2012   Anxiety state 05/13/2012   Osteopenia 05/13/2012   Hypothyroidism 05/13/2012    REFERRING DIAG:  M54.42,G89.29 (ICD-10-CM) - Chronic left-sided low back pain with left-sided sciatica  M51.36 (ICD-10-CM) - DDD (degenerative disc disease), lumbar  M16.12 (ICD-10-CM) - Primary osteoarthritis of left hip    THERAPY DIAG:  Other low back pain  Muscle weakness (generalized)  Pain in left hip  PERTINENT HISTORY: Parkinson's disease; L1 compression fracture 2020; osteoporosis; had nerve blocks in past and fell on left side; Osteoporosis;  fibromyalgia;  married 51 years; negative experience with previous PT  PRECAUTIONS: osteoporosis   SUBJECTIVE: I was very sore in my stomach after my first water therapy. I don't feel too much pain today. The TENS unit helped a lot and I ordered a new one.  PAIN:  PAIN:  Are you having pain? Yes NPRS scale: 4/10 Pain location: Lt LB Pain orientation: Bilateral  PAIN TYPE: aching Pain description: constant  Aggravating factors: when I sleep, using my cane, activity Relieving factors: laying down   OBJECTIVE: from evaluation on 10/27/21   DIAGNOSTIC FINDINGS:  Not recently   PATIENT SURVEYS:  FOTO 45%  12/21/21:  40 (pt is in a flare-up)                 SENSATION: WFL to light touch    POSTURE:  Decreased lumbar lordosis, increased thoracic kyphosis; right shoulder elevated in sitting and standing with slight right shift;  hand tremor   PALPATION: Tender points in bil gluteals and lumbar paraspinals;  decreased thoraco lumbar fascial mobility   LUMBAR ROM:  Uses partial squat to pick up items from floor;  unable to tolerate supine lying but OK with right sidelying    Active  A/PROM  10/27/21  Flexion Not tested secondary to osteoporosis  Extension 0  Right lateral flexion    Left lateral flexion    Right rotation    Left rotation     (Blank rows = not tested) Trunk strength: decreased trunk extensor strength 4/5  and decreased activation of lower abdominals  LE ROM:  WFLs, right and left knee pain with full extension LE MMT:  Able to rise from standard chair without UE assist    LUMBAR SPECIAL TESTS:  Slump test: Negative   GAIT: Distance walked: 80 feet Assistive device utilized: Single point cane Level of assistance: Complete Independence Comments: shorter step length     TODAY'S TREATMENT   12/23/21: Pt arrives for aquatic physical therapy. Treatment took place in 3.5-5.5 feet of water. Water temperature was 91 degrees F.. Pt entered the pool via stairs with moderate use of rails. Pt requires buoyancy of water for support and to offload joints with strengthening exercises.   Seated water bench with 75% submersion Pt performed seated LE AROM exercises 20x in all planes, TC to help pt stay seated. Pain assessment concurrent. 75% depth forward water walking with large noodle 6 lengths Small noodle trunk rotations 10x Hip 3 ways 10x Bil with BilUE on wall for balance Heel raises 10x Repeat forward walking 6x with noodle Seated decompression with square noodle  12/21/21 NuStep Level 1x 3.5 min- stopped due to pain Goal assessment and re-cert performed Electrical stimulation and cold pack to Lt gluteals and quadratus x15  in in Rt sidelying  Treatment on date: 12/14/21 Seated marching: 2x10 Long arc quads: 2x10- 5" hold Biceps curls and shoulder flexion 1# 2x10 bil each NuStep: level 1 x 8  minutes-PT present to discuss progress Seated hamstring stretch 2x20 seconds  Manual: elongation and release to Rt gluteals and quadratus  12/07/21: Pt arrives for aquatic physical therapy. Treatment took place in 3.5-5.5 feet of water. Water temperature was 91 degrees F.. Pt entered the pool via stairs with moderate use of rails. Pt requires buoyancy of water for support and to offload joints with strengthening exercises.   Seated water bench with 75% submersion Pt performed seated LE AROM exercises 20x in all planes, TC to help pt stay seated. Pain assessment concurrent.  75% depth leaning against the bench initially then progressing to vertical stance: shoulder flex/ext 10x, blue noodle trunk rotation to the RT for gentle lower Lt stretch 3x10: forward walking 46lengths 2x holding large noodle for back support. Horizontal decompression float with 100% flotation and PTA providing lateral sway to mobilize trunk.   PATIENT EDUCATION:  Education details: [id number], aquatic info (11/22/21) Person educated: Patient Education method: Explanation Education comprehension: verbalized understanding     HOME EXERCISE PROGRAM: Access Code: [email or web address] URL: [email or web address] Date: 11/09/2021 Prepared by: Claiborne Billings  Exercises - Seated Hamstring Stretch  - 3 x daily - 7 x weekly - 1 sets - 3 reps - 20 hold - Seated Figure 4 Piriformis Stretch  - 1 x daily - 7 x weekly - 1 sets - 3 reps - 20 hold - Seated Hip Adduction Isometrics with Ball  - 2 x daily - 7 x weekly - 1 sets - 10 reps - 5 hold - Seated Hip Abduction with Resistance  - 2 x daily - 7 x weekly - 1 sets - 10 reps   ASSESSMENT:   CLINICAL IMPRESSION: Pt arrives to aquatic PT today with mild pain. She requests not using the aqua jogger for decompression  float as it may have made her sore throughout her waist. PTA also reminded pt she did a lot of trunk rotation and that could also have made her muscles sore. Today we increased her volume of active exercise and she had very little increase in pain while she was  in the water. Pt will report on her next visit how she felt getting out of the water.    OBJECTIVE IMPAIRMENTS difficulty walking, decreased strength, increased fascial restrictions, and pain.    ACTIVITY LIMITATIONS cleaning, meal prep, and shopping.    PERSONAL FACTORS Age, Past/current experiences, Time since onset of injury/illness/exacerbation, and 1 comorbidity: Parkinson's, fibromyalgia, osteoporosis   are also affecting patient's functional outcome.      REHAB POTENTIAL: Good   CLINICAL DECISION MAKING: Stable/uncomplicated   EVALUATION COMPLEXITY: Low     GOALS: Goals reviewed with patient? Yes   SHORT TERM GOALS: Target date: 11/24/2021   The patient will demonstrate knowledge of basic self care strategies and exercises to promote healing and pain relief Baseline: Goal status: MET   2.  The patient will report a 30% improvement in pain levels with functional activities which are currently difficult including standing and walking for shopping Baseline: 60-65% (12/05/21) Goal status: MET   3.  The patient will be able to walk in the grocery store 15 minutes with the shopping cart without major exacerbation of pain Baseline:  30 minutes (12/21/21) Goal status: MET       LONG TERM GOALS: Target date: 02/03/22   The patient will be independent in a safe self progression of a home exercise program to promote further recovery of function   Baseline:  Goal status: In progress    2.  The patient will report a 75% improvement in pain levels with functional activities which are currently difficult including standing and walking Baseline: 60% (12/21/21) Goal status: Revised    3.  The patient will be able to stand for  light meal prep 8-10 min without exacerbation of pain Baseline:  able to stand for up to 15 min Goal status: MET *(12/05/21)   4.   FOTO score improved from 45% to 52% indicating improved function with less pain Baseline: 40 Goal status: In progress         PLAN: PT FREQUENCY: 2x/week   PT DURATION: 8 weeks   PLANNED INTERVENTIONS: Therapeutic exercises, Therapeutic activity, Neuromuscular re-education, Balance training, Gait training, Patient/Family education, Joint mobilization, Aquatic Therapy, Dry Needling, Electrical stimulation, Moist heat, Taping, Ultrasound, Ionotophoresis 49m/ml Dexamethasone, and Manual therapy.   PLAN FOR NEXT SESSION:    Continue to advance activity as able.  Manual to address pain. Continue aquatic PT  JMyrene Galas PTA 12/23/21 3:15 PM     [facility]ed[facility] HospitalSpecial[facility] [address] SDeer CreekGSavanna [place] [address]Phone # [phone] [phone]

## 2021-12-25 ENCOUNTER — Encounter: Payer: Self-pay | Admitting: Neurology

## 2021-12-26 ENCOUNTER — Ambulatory Visit: Payer: Medicare Other | Admitting: Physical Therapy

## 2021-12-26 ENCOUNTER — Ambulatory Visit: Payer: Medicare Other | Admitting: Family Medicine

## 2021-12-27 NOTE — Therapy (Unsigned)
OUTPATIENT PHYSICAL THERAPY TREATMENT NOTE   Patient Name: Tracy Malone MRN: [id number] DOB:14-Sep-1931, 88 y.o., female Today's Date: 12/28/2021  PCP: [name], MD REFERRING PROVIDER: Glennon Mac, DO   END OF SESSION:   PT End of Session - 12/28/21 1018     Visit Number 14    Date for PT Re-Evaluation 02/03/22    Authorization Type Medicare- KX at 15    Progress Note Due on Visit 34    PT Start Time 1015    PT Stop Time 1100    PT Time Calculation (min) 45 min    Activity Tolerance Patient tolerated treatment well    Behavior During Therapy [facility] for tasks assessed/performed                         Past Medical History:  Diagnosis Date   Bell's palsy 1966   Hx: right side facial droop, resolved per patient 04/02/19   Carotid artery disease ([place]) 2010   on vascular screening;unchanged 2013.(could not tolerate simvastatin, no other statins tried)--<30% blockage bilat 07/2011   Chronic abdominal pain    Chronic fatigue and malaise    Claustrophobia    Cyst of left ovary    last imaging 06/2021, benign, no further f/u   Depression    treated in the past for years;stopped in 2010 for a years   Duodenal ulcer 1962   h/o   Dysrhythmia    ocassional PVC's, no current problems per patient on 04/02/19   Fibromyalgia    Frequent PVCs 07/2012   Seen by [facility]: benign, asymptomatic, normal EF   Gallstones 02/2021   on [place]   GERD (gastroesophageal reflux disease)    diet controlled   Glaucoma, narrow-angle    s/p laser surgery   History of hiatal hernia    during endoscopy   Hypothyroid 03/2007   IBS (irritable bowel syndrome)    Dr. Benson [place]   Ischemic colitis ([place]) 11/21/2018   no current problems per patient on 1/0/25   Lichenoid keratosis 85/27/7824   Dr.Stinehelfer   Ocular migraine    Osteoporosis 04/2010   Dr.Hawkes; later consulted Dr. Buddy Duty (2022)   Panic attack    Parkinson disease ([place])    Parkinson's disease ([place]) 06/23/2016    Recurrent UTI    has cystocele-Dr.Grewal   Shingles 1999   h/o   Superficial thrombophlebitis 03/2009   RLE   Trochanteric bursitis 12/2008   bilateral   Past Surgical History:  Procedure Laterality Date   ABDOMINAL HYSTERECTOMY     BACK SURGERY  05/2019   CATARACT EXTRACTION, BILATERAL  1995, 1996   EYE SURGERY Bilateral    laser - glaucoma   Flexible sigmoidoscopy     KYPHOPLASTY N/A 04/03/2019   Procedure: KYPHOPLASTY L1;  Surgeon: Melina Schools, MD;  Location: [facility];  Service: Orthopedics;  Laterality: N/A;  90 mins   THYROIDECTOMY, PARTIAL  09/2005   L nodule; Dr. Harlow Asa   TONSILLECTOMY  1946   UPPER GI ENDOSCOPY  06/27/2012   VAGINAL HYSTERECTOMY  1971   and bladder repair.  Still has ovaries   WISDOM TOOTH EXTRACTION     Patient Active Problem List   Diagnosis Date Noted   Senile purpura ([place]) 11/04/2021   Calculus of gallbladder without cholecystitis without obstruction 11/01/2021   Rash 03/18/2021   Herpes zoster without complication 23/53/6144   Multiple allergies 03/18/2021   Frail elderly 03/18/2021   LUQ pain 10/29/2019  Poor appetite 10/29/2019   Loss of weight 10/29/2019   Nausea and vomiting 10/29/2019   Altered bowel habits 10/29/2019   Lumbar compression fracture ([place]) 04/03/2019   Compression fracture of L1 lumbar vertebra (HCC) 03/16/2019   Osteoporosis of multiple sites 03/16/2019   Essential tremor 02/06/2019   Ischemic colitis ([place])    Leukocytosis    Acute colitis 11/22/2018   Colitis 11/22/2018   Acute lower UTI 11/22/2018   Acute cystitis without hematuria    Parkinson's disease ([place]) 10/24/2018   Gait disturbance 11/28/2017   Chronic lymphocytic thyroiditis 04/24/2017   Status post removal of thyroid nodule 04/24/2017   Depression with anxiety 03/16/2017   Subcutaneous nodules 03/16/2017   Aortic atherosclerosis ([place]) 11/24/2016   Allergy to multiple antibiotics 10/07/2016   Seafood allergy, anaphylaxis, subsequent encounter  36/06/2448   Helicobacter pylori gastritis 10/07/2016   Fall 09/05/2016   Bloating 08/15/2016   Severe recurrent major depression without psychotic features ([place]) 04/13/2016    Class: Chronic   Rash and nonspecific skin eruption 03/01/2016   Leg pain, bilateral 03/01/2016   Functional dyspepsia 10/13/2015   Subacromial bursitis 03/02/2015   Resting tremor 02/01/2015   Epigastric fullness 10/12/2014   Early satiety 10/12/2014   Cystocele 12/30/2013   IBS (irritable bowel syndrome) 09/30/2013   GERD (gastroesophageal reflux disease) 06/27/2013   PVC's (premature ventricular contractions) 10/02/2012   Depressive disorder, not elsewhere classified 10/02/2012   Bradycardia 08/01/2012   Fatigue 08/01/2012   Anxiety state 05/13/2012   Osteopenia 05/13/2012   Hypothyroidism 05/13/2012    REFERRING DIAG:  M54.42,G89.29 (ICD-10-CM) - Chronic left-sided low back pain with left-sided sciatica  M51.36 (ICD-10-CM) - DDD (degenerative disc disease), lumbar  M16.12 (ICD-10-CM) - Primary osteoarthritis of left hip    THERAPY DIAG:  Other low back pain  Muscle weakness (generalized)  Pain in left hip  PERTINENT HISTORY: Parkinson's disease; L1 compression fracture 2020; osteoporosis; had nerve blocks in past and fell on left side; Osteoporosis;  fibromyalgia;  married 15 years; negative experience with previous PT  PRECAUTIONS: osteoporosis   SUBJECTIVE:  I have done a lot over the last 2 days! I have not had terrible pain even after doing all these chores at home. Denies current pain. Trunk muscles were sore after last pool treatment.   PAIN:  PAIN:  Are you having pain? No NPRS scale:  Pain location: Lt LB Pain orientation: Bilateral  PAIN TYPE: aching Pain description: constant  Aggravating factors: when I sleep, using my cane, activity Relieving factors: laying down   OBJECTIVE: from evaluation on 10/27/21   DIAGNOSTIC FINDINGS:  Not recently   PATIENT SURVEYS:  FOTO 45%   12/21/21:  40 (pt is in a flare-up)                SENSATION: WFL to light touch    POSTURE:  Decreased lumbar lordosis, increased thoracic kyphosis; right shoulder elevated in sitting and standing with slight right shift;  hand tremor   PALPATION: Tender points in bil gluteals and lumbar paraspinals;  decreased thoraco lumbar fascial mobility   LUMBAR ROM:  Uses partial squat to pick up items from floor;  unable to tolerate supine lying but OK with right sidelying    Active  A/PROM  10/27/21  Flexion Not tested secondary to osteoporosis  Extension 0  Right lateral flexion    Left lateral flexion    Right rotation    Left rotation     (Blank rows = not tested) Trunk  strength: decreased trunk extensor strength 4/5 and decreased activation of lower abdominals  LE ROM:  WFLs, right and left knee pain with full extension LE MMT:  Able to rise from standard chair without UE assist    LUMBAR SPECIAL TESTS:  Slump test: Negative   GAIT: Distance walked: 80 feet Assistive device utilized: Single point cane Level of assistance: Complete Independence Comments: shorter step length     TODAY'S TREATMENT   12/28/21: Pt arrives for aquatic physical therapy. Treatment took place in 3.5-5.5 feet of water. Water temperature was 91 degrees F.. Pt entered the pool via stairs with moderate use of rails stepping sideways. Pt requires buoyancy of water for support and to offload joints with strengthening exercises.   Seated water bench with 75% submersion Pt performed seated LE AROM exercises 20x in all planes, TC to help pt stay seated. Pain assessment concurrent. 75% depth forward water walking with large noodle 6 lengths, 4 length backwards and sidestepping today.  Hip 3 ways 10x Bil with BilUE on wall for balance Heel raises 10x, steps ups on water bench 10x BilLE Seated decompression with square noodle, PTA holding LE down, intermittent LE movements: scissors 10x in each direction and a  little flutter kicking.  12/23/21: Pt arrives for aquatic physical therapy. Treatment took place in 3.5-5.5 feet of water. Water temperature was 91 degrees F.. Pt entered the pool via stairs with moderate use of rails. Pt requires buoyancy of water for support and to offload joints with strengthening exercises.   Seated water bench with 75% submersion Pt performed seated LE AROM exercises 20x in all planes, TC to help pt stay seated. Pain assessment concurrent. 75% depth forward water walking with large noodle 6 lengths Small noodle trunk rotations 10x Hip 3 ways 10x Bil with BilUE on wall for balance Heel raises 10x Repeat forward walking 6x with noodle Seated decompression with square noodle  12/21/21 NuStep Level 1x 3.5 min- stopped due to pain Goal assessment and re-cert performed Electrical stimulation and cold pack to Lt gluteals and quadratus x15 in in Rt sidelying  Treatment on date: 12/14/21 Seated marching: 2x10 Long arc quads: 2x10- 5" hold Biceps curls and shoulder flexion 1# 2x10 bil each NuStep: level 1 x 8  minutes-PT present to discuss progress Seated hamstring stretch 2x20 seconds  Manual: elongation and release to Rt gluteals and quadratus  12/07/21: Pt arrives for aquatic physical therapy. Treatment took place in 3.5-5.5 feet of water. Water temperature was 91 degrees F.. Pt entered the pool via stairs with moderate use of rails. Pt requires buoyancy of water for support and to offload joints with strengthening exercises.   Seated water bench with 75% submersion Pt performed seated LE AROM exercises 20x in all planes, TC to help pt stay seated. Pain assessment concurrent.  75% depth leaning against the bench initially then progressing to vertical stance: shoulder flex/ext 10x, blue noodle trunk rotation to the RT for gentle lower Lt stretch 3x10: forward walking 46lengths 2x holding large noodle for back support. Horizontal decompression float with 100% flotation and PTA  providing lateral sway to mobilize trunk.   PATIENT EDUCATION:  Education details: [id number], aquatic info (11/22/21) Person educated: Patient Education method: Explanation Education comprehension: verbalized understanding     HOME EXERCISE PROGRAM: Access Code: [email or web address] URL: [email or web address] Date: 11/09/2021 Prepared by: Claiborne Billings  Exercises - Seated Hamstring Stretch  - 3 x daily - 7 x weekly - 1 sets - 3 reps - 20 hold - Seated Figure  4 Piriformis Stretch  - 1 x daily - 7 x weekly - 1 sets - 3 reps - 20 hold - Seated Hip Adduction Isometrics with Ball  - 2 x daily - 7 x weekly - 1 sets - 10 reps - 5 hold - Seated Hip Abduction with Resistance  - 2 x daily - 7 x weekly - 1 sets - 10 reps   ASSESSMENT:   CLINICAL IMPRESSION: Pt arrives to aquatic PT ambulating with SPC instead of her rolling walker. Pt reports increased activity over the last 2 days with decreased pain. Due to this improvement we were able to increase work load in the pool. Pt did report needing to giver her back a rest after the standing wall exercises.    OBJECTIVE IMPAIRMENTS difficulty walking, decreased strength, increased fascial restrictions, and pain.    ACTIVITY LIMITATIONS cleaning, meal prep, and shopping.    PERSONAL FACTORS Age, Past/current experiences, Time since onset of injury/illness/exacerbation, and 1 comorbidity: Parkinson's, fibromyalgia, osteoporosis   are also affecting patient's functional outcome.      REHAB POTENTIAL: Good   CLINICAL DECISION MAKING: Stable/uncomplicated   EVALUATION COMPLEXITY: Low     GOALS: Goals reviewed with patient? Yes   SHORT TERM GOALS: Target date: 11/24/2021   The patient will demonstrate knowledge of basic self care strategies and exercises to promote healing and pain relief Baseline: Goal status: MET   2.  The patient will report a 30% improvement in pain levels with functional activities which are currently difficult including  standing and walking for shopping Baseline: 60-65% (12/05/21) Goal status: MET   3.  The patient will be able to walk in the grocery store 15 minutes with the shopping cart without major exacerbation of pain Baseline:  30 minutes (12/21/21) Goal status: MET       LONG TERM GOALS: Target date: 02/03/22   The patient will be independent in a safe self progression of a home exercise program to promote further recovery of function   Baseline:  Goal status: In progress    2.  The patient will report a 75% improvement in pain levels with functional activities which are currently difficult including standing and walking Baseline: 60% (12/21/21) Goal status: Revised    3.  The patient will be able to stand for light meal prep 8-10 min without exacerbation of pain Baseline:  able to stand for up to 15 min Goal status: MET *(12/05/21)   4.   FOTO score improved from 45% to 52% indicating improved function with less pain Baseline: 40 Goal status: In progress         PLAN: PT FREQUENCY: 2x/week   PT DURATION: 8 weeks   PLANNED INTERVENTIONS: Therapeutic exercises, Therapeutic activity, Neuromuscular re-education, Balance training, Gait training, Patient/Family education, Joint mobilization, Aquatic Therapy, Dry Needling, Electrical stimulation, Moist heat, Taping, Ultrasound, Ionotophoresis 85m/ml Dexamethasone, and Manual therapy.   PLAN FOR NEXT SESSION:    Continue to advance activity as able.  Manual to address pain. Continue aquatic PT  JMyrene Galas PTA 12/28/21 7:28 PM     [facility] Center[facility] [address] SBraseltonGBeech Grove [place] [address]Phone # [phone] [phone]

## 2021-12-28 ENCOUNTER — Encounter: Payer: Self-pay | Admitting: Physical Therapy

## 2021-12-28 ENCOUNTER — Ambulatory Visit: Payer: Medicare Other | Admitting: Physical Therapy

## 2021-12-28 DIAGNOSIS — M5459 Other low back pain: Secondary | ICD-10-CM

## 2021-12-28 DIAGNOSIS — M25552 Pain in left hip: Secondary | ICD-10-CM

## 2021-12-28 DIAGNOSIS — M6281 Muscle weakness (generalized): Secondary | ICD-10-CM | POA: Diagnosis not present

## 2022-01-02 ENCOUNTER — Encounter: Payer: Medicare Other | Admitting: Physical Therapy

## 2022-01-03 ENCOUNTER — Telehealth (INDEPENDENT_AMBULATORY_CARE_PROVIDER_SITE_OTHER): Payer: Medicare Other | Admitting: Psychiatry

## 2022-01-03 DIAGNOSIS — F331 Major depressive disorder, recurrent, moderate: Secondary | ICD-10-CM

## 2022-01-03 NOTE — Progress Notes (Signed)
[facility] Counselor/Therapist Progress Note  Patient ID: Tracy Malone, MRN: [id number],    Date: 01/03/2022  Time Spent: 50 minutes   Virtual Visit via Telehealth Note; MyChart Video session Connected with patient by a telemedicine/telehealth application, with their informed consent, and verified patient privacy and that I am speaking with the correct person using two identifiers. I discussed the limitations, risks, security and privacy concerns of performing psychotherapy and the availability of in person appointments. I also discussed with the patient that there may be a patient responsible charge related to this service. The patient expressed understanding and agreed to proceed. I discussed the treatment planning with the patient. The patient was provided an opportunity to ask questions and all were answered. The patient agreed with the plan and demonstrated an understanding of the instructions. The patient was advised to call  our office if  symptoms worsen or feel they are in a crisis state and need immediate contact.   Therapist Location: office Patient Location: home   Treatment Type: Individual Therapy  Reported Symptoms: depression, overwhelmed, lonely, fear, anxiety  Mental Status Exam:  Appearance:   N/a       Behavior:  Appropriate, Sharing, and Motivated  Motor:  N/a  Speech/Language:   Clear and Coherent  Affect:  Appropriate  Mood:  anxious and depressed  Thought process:  Some tangentiality  Thought content:    Rumination and overthinking  Sensory/Perceptual disturbances:    WNL  Orientation:  oriented to person, place, time/date, situation, day of week, month of year, year, and stated date of January 03, 2022  Attention:  Fair  Concentration:  Fair  Memory:  [place] of knowledge:   Good and Fair  Insight:    Good and Fair  Judgment:   Good  Impulse Control:  Good   Risk Assessment: Danger to Self:  No Self-injurious Behavior: No Danger to Others:  No Duty to Warn:no Physical Aggression / Violence:No  Access to Firearms a concern: No  Gang Involvement:No   Subjective: Patient today reporting anxiety, depression increased some but no SI, fear, overwhelmed, and some loneliness. Worried about her and her husband's physical problems.  Some tearfulness intermittently.  Husband going today for a lung scan and patient concerned about "what the results might be and have been assuming the worst." Also worried "about his hearing and other aging issues." Patient reports she is to have a scan later in year that will provide more updated info on her current Parkinson's. Daughter supportive. Have close neighbors that husband interacts more with, but patient not too involved with them. States she may call their minister for added support. Still working some on her negative self-talk "but forgets sometimes." Tries to get "good sleep at night and when I do, that helps." Encouraged to get outside some as she is able to do so safely, and to interact some with her neighbors. Plans to ask her med provider here about possibly increasing her Prozac as "it might help me some for depression and anxiety".  "Able to watch TV and move about the house some which helps."  Family is checking in on her and husband.  Interventions: Solution-Oriented/Positive Psychology and Ego-Supportive  Treatment Goals: Treatment goals will remain on treatment plan as patient works with strategies to achieve her goals.  Progress will be noted each session and documented in the "progress" section of note. Long term goal: Develop the ability to recognize, accept, and cope with feelings of anxiety  and depression. Short term goal: Verbalize any unresolved grief issues that may be contributing to anxiety and depression. Strategy: Replace negative self-defeating self talk with verbalization of realistic and positive cognitive messages to lessen depression/anxiety and improve mood.  Diagnosis:    ICD-10-CM   1. Major depressive disorder, recurrent episode, moderate (HCC)  F33.1      Plan:  Patient today showing motivation and good participation in session, although has been struggling more with some depression and anxiety, fears, feeling overwhelmed, and some loneliness.  Encouraged patient to be in touch with some neighbors that live very close to their house and also to be in touch with their minister who has visited them and was very very supportive of patient.  Trying not to assume negatives and this is very challenging for patient.  Family is supportive.  Denies any SI.  Feels that when she is getting more consistent good sleep at night helps.  Encouraged her to call people that she knows is supportive of her and has been since they feel more limited in getting out right now.  Encouraged patient also to be practicing some of the positive behaviors discussed in sessions including: Overall positive self-care, consider doing more activities that she enjoys and is able to do such as reading and talking with friends on the phone and some movement within her home as she is able, listening to music which she finds calming, getting books when possible from the nearby [employer] that she enjoys, not overly focusing on negatives, allow for good sleep patterns including not sleeping too much during the day, staying in the present and focusing on what she can control or change, staying in contact with family members and friends who are supportive, and recognize the strength she shows working with goal directed behaviors to move in a direction that supports her improved emotional health and overall outlook.  Goal review and progress/challenges noted with patient.  Next appointment within 3 weeks.  This record has been created using [employer].  Chart creation errors have been sought, but may not always have been located and corrected.  Such creation errors do not reflect on the standard of  medical care provided.   Shanon Ace, LCSW

## 2022-01-04 ENCOUNTER — Other Ambulatory Visit: Payer: Medicare Other | Admitting: *Deleted

## 2022-01-04 DIAGNOSIS — Z515 Encounter for palliative care: Secondary | ICD-10-CM

## 2022-01-04 NOTE — Progress Notes (Signed)
[facility] COMMUNITY PALLIATIVE CARE RN NOTE  PATIENT NAME: Tracy Malone DOB: Feb 08, 1932 MRN: [id number]  PRIMARY CARE PROVIDER: [name], MD  RESPONSIBLE PARTY: [name] (spouse) Acct ID - Guarantor Home Phone Work Phone Relationship Acct Type  [id number] AYAN, [phone] [id number] Self P/F     [address] [place], [place] [address]   RN telephonic encounter completed with patient today. She reports continued issues with lower back and leg pain, mainly on the left side. She continues to go to outpatient PT where she takes water aerobics once weekly. She desires to go twice weekly. She remains ambulatory and able to perform ADLs independently but is very careful. Denies any recent falls. Feels her appetite is fair. Her bowels are moving regularly. She desires an in person visit so it is scheduled for 01/16/22'@11a'$ . Palliative care will continue to follow.    (Duration of visit and documentation 20 minutes)   [name], RN BSN

## 2022-01-05 ENCOUNTER — Telehealth: Payer: Self-pay | Admitting: Adult Health

## 2022-01-05 NOTE — Telephone Encounter (Signed)
Called patient and let her know that it can take 6-8 weeks for medication to be effective. She has been on less than a month. She understands and will call again in a couple of weeks if not effective.

## 2022-01-05 NOTE — Telephone Encounter (Signed)
Pt called at 3:10 pm and wants to know if she can increase her prozac to 20 mg. She says it is not working and she doesn't feel any better. Please call her at [phone]

## 2022-01-06 ENCOUNTER — Encounter: Payer: Self-pay | Admitting: Physical Therapy

## 2022-01-06 ENCOUNTER — Ambulatory Visit: Payer: Medicare Other | Admitting: Physical Therapy

## 2022-01-06 DIAGNOSIS — M5459 Other low back pain: Secondary | ICD-10-CM | POA: Diagnosis not present

## 2022-01-06 DIAGNOSIS — M25552 Pain in left hip: Secondary | ICD-10-CM

## 2022-01-06 DIAGNOSIS — M6281 Muscle weakness (generalized): Secondary | ICD-10-CM

## 2022-01-06 NOTE — Therapy (Signed)
OUTPATIENT PHYSICAL THERAPY TREATMENT NOTE   Patient Name: Tracy Malone MRN: [id number] DOB:09/27/1931, 88 y.o., female Today's Date: 01/06/2022  PCP: Knapp, Eve, MD REFERRING PROVIDER: Jackson, Benjamin, DO   END OF SESSION:   PT End of Session - 01/06/22 1217     Visit Number 15    Date for PT Re-Evaluation 02/03/22    Authorization Type Medicare- KX at 15    Progress Note Due on Visit 19    PT Start Time 1216    PT Stop Time 1300    PT Time Calculation (min) 44 min    Activity Tolerance Patient tolerated treatment well    Behavior During Therapy WFL for tasks assessed/performed                          Past Medical History:  Diagnosis Date   Bell's palsy 1966   Hx: right side facial droop, resolved per patient 04/02/19   Carotid artery disease (HCC) 2010   on vascular screening;unchanged 2013.(could not tolerate simvastatin, no other statins tried)--<30% blockage bilat 07/2011   Chronic abdominal pain    Chronic fatigue and malaise    Claustrophobia    Cyst of left ovary    last imaging 06/2021, benign, no further f/u   Depression    treated in the past for years;stopped in 2010 for a years   Duodenal ulcer 1962   h/o   Dysrhythmia    ocassional PVC's, no current problems per patient on 04/02/19   Fibromyalgia    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 Hung   Ischemic colitis (HCC) 11/21/2018   no current problems per patient on 04/02/19   Lichenoid keratosis 12/03/2020   Dr.Stinehelfer   Ocular migraine    Osteoporosis 04/2010   Dr.Hawkes; later consulted Dr. Kerr (2022)   Panic attack    Parkinson disease (HCC)    Parkinson's disease (HCC) 06/23/2016    Recurrent UTI    has cystocele-Dr.Grewal   Shingles 1999   h/o   Superficial thrombophlebitis 03/2009   RLE   Trochanteric bursitis 12/2008   bilateral   Past Surgical History:  Procedure Laterality Date   ABDOMINAL HYSTERECTOMY     BACK SURGERY  05/2019   CATARACT EXTRACTION, BILATERAL  1995, 1996   EYE SURGERY Bilateral    laser - glaucoma   Flexible sigmoidoscopy     KYPHOPLASTY N/A 04/03/2019   Procedure: KYPHOPLASTY L1;  Surgeon: Brooks, Dahari, MD;  Location: [facility] OR;  Service: Orthopedics;  Laterality: N/A;  90 mins   THYROIDECTOMY, PARTIAL  09/2005   L nodule; Dr. Gerkin   TONSILLECTOMY  1946   UPPER GI ENDOSCOPY  06/27/2012   VAGINAL HYSTERECTOMY  1971   and bladder repair.  Still has ovaries   WISDOM TOOTH EXTRACTION     Patient Active Problem List   Diagnosis Date Noted   Senile purpura (HCC) 11/04/2021   Calculus of gallbladder without cholecystitis without obstruction 11/01/2021   Rash 03/18/2021   Herpes zoster without complication 03/18/2021   Multiple allergies 03/18/2021   Frail elderly 03/18/2021   LUQ pain 10/29/2019     OUTPATIENT PHYSICAL THERAPY TREATMENT NOTE   Patient Name: Tracy Malone MRN: [id number] DOB:11/14/1931, 88 y.o., female Today's Date: 01/06/2022  PCP: Knapp, Eve, MD REFERRING PROVIDER: Jackson, Benjamin, DO   END OF SESSION:   PT End of Session - 01/06/22 1217     Visit Number 15    Date for PT Re-Evaluation 02/03/22    Authorization Type Medicare- KX at 15    Progress Note Due on Visit 19    PT Start Time 1216    PT Stop Time 1300    PT Time Calculation (min) 44 min    Activity Tolerance Patient tolerated treatment well    Behavior During Therapy WFL for tasks assessed/performed                          Past Medical History:  Diagnosis Date   Bell's palsy 1966   Hx: right side facial droop, resolved per patient 04/02/19   Carotid artery disease (HCC) 2010   on vascular screening;unchanged 2013.(could not tolerate simvastatin, no other statins tried)--<30% blockage bilat 07/2011   Chronic abdominal pain    Chronic fatigue and malaise    Claustrophobia    Cyst of left ovary    last imaging 06/2021, benign, no further f/u   Depression    treated in the past for years;stopped in 2010 for a years   Duodenal ulcer 1962   h/o   Dysrhythmia    ocassional PVC's, no current problems per patient on 04/02/19   Fibromyalgia    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 Hung   Ischemic colitis (HCC) 11/21/2018   no current problems per patient on 04/02/19   Lichenoid keratosis 12/03/2020   Dr.Stinehelfer   Ocular migraine    Osteoporosis 04/2010   Dr.Hawkes; later consulted Dr. Kerr (2022)   Panic attack    Parkinson disease (HCC)    Parkinson's disease (HCC) 06/23/2016    Recurrent UTI    has cystocele-Dr.Grewal   Shingles 1999   h/o   Superficial thrombophlebitis 03/2009   RLE   Trochanteric bursitis 12/2008   bilateral   Past Surgical History:  Procedure Laterality Date   ABDOMINAL HYSTERECTOMY     BACK SURGERY  05/2019   CATARACT EXTRACTION, BILATERAL  1995, 1996   EYE SURGERY Bilateral    laser - glaucoma   Flexible sigmoidoscopy     KYPHOPLASTY N/A 04/03/2019   Procedure: KYPHOPLASTY L1;  Surgeon: Brooks, Dahari, MD;  Location: [facility] OR;  Service: Orthopedics;  Laterality: N/A;  90 mins   THYROIDECTOMY, PARTIAL  09/2005   L nodule; Dr. Gerkin   TONSILLECTOMY  1946   UPPER GI ENDOSCOPY  06/27/2012   VAGINAL HYSTERECTOMY  1971   and bladder repair.  Still has ovaries   WISDOM TOOTH EXTRACTION     Patient Active Problem List   Diagnosis Date Noted   Senile purpura (HCC) 11/04/2021   Calculus of gallbladder without cholecystitis without obstruction 11/01/2021   Rash 03/18/2021   Herpes zoster without complication 03/18/2021   Multiple allergies 03/18/2021   Frail elderly 03/18/2021   LUQ pain 10/29/2019     OUTPATIENT PHYSICAL THERAPY TREATMENT NOTE   Patient Name: Tracy Malone MRN: [id number] DOB:09/27/1931, 88 y.o., female Today's Date: 01/06/2022  PCP: Knapp, Eve, MD REFERRING PROVIDER: Jackson, Benjamin, DO   END OF SESSION:   PT End of Session - 01/06/22 1217     Visit Number 15    Date for PT Re-Evaluation 02/03/22    Authorization Type Medicare- KX at 15    Progress Note Due on Visit 19    PT Start Time 1216    PT Stop Time 1300    PT Time Calculation (min) 44 min    Activity Tolerance Patient tolerated treatment well    Behavior During Therapy WFL for tasks assessed/performed                          Past Medical History:  Diagnosis Date   Bell's palsy 1966   Hx: right side facial droop, resolved per patient 04/02/19   Carotid artery disease (HCC) 2010   on vascular screening;unchanged 2013.(could not tolerate simvastatin, no other statins tried)--<30% blockage bilat 07/2011   Chronic abdominal pain    Chronic fatigue and malaise    Claustrophobia    Cyst of left ovary    last imaging 06/2021, benign, no further f/u   Depression    treated in the past for years;stopped in 2010 for a years   Duodenal ulcer 1962   h/o   Dysrhythmia    ocassional PVC's, no current problems per patient on 04/02/19   Fibromyalgia    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 Hung   Ischemic colitis (HCC) 11/21/2018   no current problems per patient on 04/02/19   Lichenoid keratosis 12/03/2020   Dr.Stinehelfer   Ocular migraine    Osteoporosis 04/2010   Dr.Hawkes; later consulted Dr. Kerr (2022)   Panic attack    Parkinson disease (HCC)    Parkinson's disease (HCC) 06/23/2016    Recurrent UTI    has cystocele-Dr.Grewal   Shingles 1999   h/o   Superficial thrombophlebitis 03/2009   RLE   Trochanteric bursitis 12/2008   bilateral   Past Surgical History:  Procedure Laterality Date   ABDOMINAL HYSTERECTOMY     BACK SURGERY  05/2019   CATARACT EXTRACTION, BILATERAL  1995, 1996   EYE SURGERY Bilateral    laser - glaucoma   Flexible sigmoidoscopy     KYPHOPLASTY N/A 04/03/2019   Procedure: KYPHOPLASTY L1;  Surgeon: Brooks, Dahari, MD;  Location: [facility] OR;  Service: Orthopedics;  Laterality: N/A;  90 mins   THYROIDECTOMY, PARTIAL  09/2005   L nodule; Dr. Gerkin   TONSILLECTOMY  1946   UPPER GI ENDOSCOPY  06/27/2012   VAGINAL HYSTERECTOMY  1971   and bladder repair.  Still has ovaries   WISDOM TOOTH EXTRACTION     Patient Active Problem List   Diagnosis Date Noted   Senile purpura (HCC) 11/04/2021   Calculus of gallbladder without cholecystitis without obstruction 11/01/2021   Rash 03/18/2021   Herpes zoster without complication 03/18/2021   Multiple allergies 03/18/2021   Frail elderly 03/18/2021   LUQ pain 10/29/2019     OUTPATIENT PHYSICAL THERAPY TREATMENT NOTE   Patient Name: Tracy Malone MRN: [id number] DOB:09/27/1931, 88 y.o., female Today's Date: 01/06/2022  PCP: Knapp, Eve, MD REFERRING PROVIDER: Jackson, Benjamin, DO   END OF SESSION:   PT End of Session - 01/06/22 1217     Visit Number 15    Date for PT Re-Evaluation 02/03/22    Authorization Type Medicare- KX at 15    Progress Note Due on Visit 19    PT Start Time 1216    PT Stop Time 1300    PT Time Calculation (min) 44 min    Activity Tolerance Patient tolerated treatment well    Behavior During Therapy WFL for tasks assessed/performed                          Past Medical History:  Diagnosis Date   Bell's palsy 1966   Hx: right side facial droop, resolved per patient 04/02/19   Carotid artery disease (HCC) 2010   on vascular screening;unchanged 2013.(could not tolerate simvastatin, no other statins tried)--<30% blockage bilat 07/2011   Chronic abdominal pain    Chronic fatigue and malaise    Claustrophobia    Cyst of left ovary    last imaging 06/2021, benign, no further f/u   Depression    treated in the past for years;stopped in 2010 for a years   Duodenal ulcer 1962   h/o   Dysrhythmia    ocassional PVC's, no current problems per patient on 04/02/19   Fibromyalgia    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 Hung   Ischemic colitis (HCC) 11/21/2018   no current problems per patient on 04/02/19   Lichenoid keratosis 12/03/2020   Dr.Stinehelfer   Ocular migraine    Osteoporosis 04/2010   Dr.Hawkes; later consulted Dr. Kerr (2022)   Panic attack    Parkinson disease (HCC)    Parkinson's disease (HCC) 06/23/2016    Recurrent UTI    has cystocele-Dr.Grewal   Shingles 1999   h/o   Superficial thrombophlebitis 03/2009   RLE   Trochanteric bursitis 12/2008   bilateral   Past Surgical History:  Procedure Laterality Date   ABDOMINAL HYSTERECTOMY     BACK SURGERY  05/2019   CATARACT EXTRACTION, BILATERAL  1995, 1996   EYE SURGERY Bilateral    laser - glaucoma   Flexible sigmoidoscopy     KYPHOPLASTY N/A 04/03/2019   Procedure: KYPHOPLASTY L1;  Surgeon: Brooks, Dahari, MD;  Location: [facility] OR;  Service: Orthopedics;  Laterality: N/A;  90 mins   THYROIDECTOMY, PARTIAL  09/2005   L nodule; Dr. Gerkin   TONSILLECTOMY  1946   UPPER GI ENDOSCOPY  06/27/2012   VAGINAL HYSTERECTOMY  1971   and bladder repair.  Still has ovaries   WISDOM TOOTH EXTRACTION     Patient Active Problem List   Diagnosis Date Noted   Senile purpura (HCC) 11/04/2021   Calculus of gallbladder without cholecystitis without obstruction 11/01/2021   Rash 03/18/2021   Herpes zoster without complication 03/18/2021   Multiple allergies 03/18/2021   Frail elderly 03/18/2021   LUQ pain 10/29/2019     OUTPATIENT PHYSICAL THERAPY TREATMENT NOTE   Patient Name: Tracy Malone MRN: [id number] DOB:09/27/1931, 88 y.o., female Today's Date: 01/06/2022  PCP: Knapp, Eve, MD REFERRING PROVIDER: Jackson, Benjamin, DO   END OF SESSION:   PT End of Session - 01/06/22 1217     Visit Number 15    Date for PT Re-Evaluation 02/03/22    Authorization Type Medicare- KX at 15    Progress Note Due on Visit 19    PT Start Time 1216    PT Stop Time 1300    PT Time Calculation (min) 44 min    Activity Tolerance Patient tolerated treatment well    Behavior During Therapy WFL for tasks assessed/performed                          Past Medical History:  Diagnosis Date   Bell's palsy 1966   Hx: right side facial droop, resolved per patient 04/02/19   Carotid artery disease (HCC) 2010   on vascular screening;unchanged 2013.(could not tolerate simvastatin, no other statins tried)--<30% blockage bilat 07/2011   Chronic abdominal pain    Chronic fatigue and malaise    Claustrophobia    Cyst of left ovary    last imaging 06/2021, benign, no further f/u   Depression    treated in the past for years;stopped in 2010 for a years   Duodenal ulcer 1962   h/o   Dysrhythmia    ocassional PVC's, no current problems per patient on 04/02/19   Fibromyalgia    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 Hung   Ischemic colitis (HCC) 11/21/2018   no current problems per patient on 04/02/19   Lichenoid keratosis 12/03/2020   Dr.Stinehelfer   Ocular migraine    Osteoporosis 04/2010   Dr.Hawkes; later consulted Dr. Kerr (2022)   Panic attack    Parkinson disease (HCC)    Parkinson's disease (HCC) 06/23/2016    Recurrent UTI    has cystocele-Dr.Grewal   Shingles 1999   h/o   Superficial thrombophlebitis 03/2009   RLE   Trochanteric bursitis 12/2008   bilateral   Past Surgical History:  Procedure Laterality Date   ABDOMINAL HYSTERECTOMY     BACK SURGERY  05/2019   CATARACT EXTRACTION, BILATERAL  1995, 1996   EYE SURGERY Bilateral    laser - glaucoma   Flexible sigmoidoscopy     KYPHOPLASTY N/A 04/03/2019   Procedure: KYPHOPLASTY L1;  Surgeon: Brooks, Dahari, MD;  Location: [facility] OR;  Service: Orthopedics;  Laterality: N/A;  90 mins   THYROIDECTOMY, PARTIAL  09/2005   L nodule; Dr. Gerkin   TONSILLECTOMY  1946   UPPER GI ENDOSCOPY  06/27/2012   VAGINAL HYSTERECTOMY  1971   and bladder repair.  Still has ovaries   WISDOM TOOTH EXTRACTION     Patient Active Problem List   Diagnosis Date Noted   Senile purpura (HCC) 11/04/2021   Calculus of gallbladder without cholecystitis without obstruction 11/01/2021   Rash 03/18/2021   Herpes zoster without complication 03/18/2021   Multiple allergies 03/18/2021   Frail elderly 03/18/2021   LUQ pain 10/29/2019  

## 2022-01-09 ENCOUNTER — Ambulatory Visit: Payer: Medicare Other

## 2022-01-09 DIAGNOSIS — M25552 Pain in left hip: Secondary | ICD-10-CM

## 2022-01-09 DIAGNOSIS — M5459 Other low back pain: Secondary | ICD-10-CM

## 2022-01-09 DIAGNOSIS — M6281 Muscle weakness (generalized): Secondary | ICD-10-CM

## 2022-01-09 NOTE — Therapy (Signed)
OUTPATIENT PHYSICAL THERAPY TREATMENT NOTE   Patient Name: Tracy Malone MRN: [id number] DOB:10-15-1931, 88 y.o., female Today's Date: 01/09/2022  PCP: [name], MD REFERRING PROVIDER: Glennon Mac, DO   END OF SESSION:   PT End of Session - 01/09/22 1447     Visit Number 16    Date for PT Re-Evaluation 02/03/22    Authorization Type Medicare- KX at 15    Progress Note Due on Visit 70    PT Start Time 1401    PT Stop Time 1441    PT Time Calculation (min) 40 min    Activity Tolerance Patient tolerated treatment well    Behavior During Therapy [facility] for tasks assessed/performed                           Past Medical History:  Diagnosis Date   Bell's palsy 1966   Hx: right side facial droop, resolved per patient 04/02/19   Carotid artery disease ([place]) 2010   on vascular screening;unchanged 2013.(could not tolerate simvastatin, no other statins tried)--<30% blockage bilat 07/2011   Chronic abdominal pain    Chronic fatigue and malaise    Claustrophobia    Cyst of left ovary    last imaging 06/2021, benign, no further f/u   Depression    treated in the past for years;stopped in 2010 for a years   Duodenal ulcer 1962   h/o   Dysrhythmia    ocassional PVC's, no current problems per patient on 04/02/19   Fibromyalgia    Frequent PVCs 07/2012   Seen by [facility]: benign, asymptomatic, normal EF   Gallstones 02/2021   on [place]   GERD (gastroesophageal reflux disease)    diet controlled   Glaucoma, narrow-angle    s/p laser surgery   History of hiatal hernia    during endoscopy   Hypothyroid 03/2007   IBS (irritable bowel syndrome)    Dr. Benson [place]   Ischemic colitis ([place]) 11/21/2018   no current problems per patient on 1/0/25   Lichenoid keratosis 85/27/7824   Dr.Stinehelfer   Ocular migraine    Osteoporosis 04/2010   Dr.Hawkes; later consulted Dr. Buddy Duty (2022)   Panic attack    Parkinson disease ([place])    Parkinson's disease ([place]) 06/23/2016    Recurrent UTI    has cystocele-Dr.Grewal   Shingles 1999   h/o   Superficial thrombophlebitis 03/2009   RLE   Trochanteric bursitis 12/2008   bilateral   Past Surgical History:  Procedure Laterality Date   ABDOMINAL HYSTERECTOMY     BACK SURGERY  05/2019   CATARACT EXTRACTION, BILATERAL  1995, 1996   EYE SURGERY Bilateral    laser - glaucoma   Flexible sigmoidoscopy     KYPHOPLASTY N/A 04/03/2019   Procedure: KYPHOPLASTY L1;  Surgeon: Melina Schools, MD;  Location: [facility];  Service: Orthopedics;  Laterality: N/A;  90 mins   THYROIDECTOMY, PARTIAL  09/2005   L nodule; Dr. Harlow Asa   TONSILLECTOMY  1946   UPPER GI ENDOSCOPY  06/27/2012   VAGINAL HYSTERECTOMY  1971   and bladder repair.  Still has ovaries   WISDOM TOOTH EXTRACTION     Patient Active Problem List   Diagnosis Date Noted   Senile purpura ([place]) 11/04/2021   Calculus of gallbladder without cholecystitis without obstruction 11/01/2021   Rash 03/18/2021   Herpes zoster without complication 23/53/6144   Multiple allergies 03/18/2021   Frail elderly 03/18/2021   LUQ pain  10/29/2019   Poor appetite 10/29/2019   Loss of weight 10/29/2019   Nausea and vomiting 10/29/2019   Altered bowel habits 10/29/2019   Lumbar compression fracture ([place]) 04/03/2019   Compression fracture of L1 lumbar vertebra ([facility]) 03/16/2019   Osteoporosis of multiple sites 03/16/2019   Essential tremor 02/06/2019   Ischemic colitis ([place])    Leukocytosis    Acute colitis 11/22/2018   Colitis 11/22/2018   Acute lower UTI 11/22/2018   Acute cystitis without hematuria    Parkinson's disease ([place]) 10/24/2018   Gait disturbance 11/28/2017   Chronic lymphocytic thyroiditis 04/24/2017   Status post removal of thyroid nodule 04/24/2017   Depression with anxiety 03/16/2017   Subcutaneous nodules 03/16/2017   Aortic atherosclerosis ([place]) 11/24/2016   Allergy to multiple antibiotics 10/07/2016   Seafood allergy, anaphylaxis, subsequent encounter  97/28/2060   Helicobacter pylori gastritis 10/07/2016   Fall 09/05/2016   Bloating 08/15/2016   Severe recurrent major depression without psychotic features ([place]) 04/13/2016    Class: Chronic   Rash and nonspecific skin eruption 03/01/2016   Leg pain, bilateral 03/01/2016   Functional dyspepsia 10/13/2015   Subacromial bursitis 03/02/2015   Resting tremor 02/01/2015   Epigastric fullness 10/12/2014   Early satiety 10/12/2014   Cystocele 12/30/2013   IBS (irritable bowel syndrome) 09/30/2013   GERD (gastroesophageal reflux disease) 06/27/2013   PVC's (premature ventricular contractions) 10/02/2012   Depressive disorder, not elsewhere classified 10/02/2012   Bradycardia 08/01/2012   Fatigue 08/01/2012   Anxiety state 05/13/2012   Osteopenia 05/13/2012   Hypothyroidism 05/13/2012    REFERRING DIAG:  M54.42,G89.29 (ICD-10-CM) - Chronic left-sided low back pain with left-sided sciatica  M51.36 (ICD-10-CM) - DDD (degenerative disc disease), lumbar  M16.12 (ICD-10-CM) - Primary osteoarthritis of left hip    THERAPY DIAG:  Other low back pain  Muscle weakness (generalized)  Pain in left hip  PERTINENT HISTORY: Parkinson's disease; L1 compression fracture 2020; osteoporosis; had nerve blocks in past and fell on left side; Osteoporosis;  fibromyalgia;  married 70 years; negative experience with previous PT  PRECAUTIONS: osteoporosis   SUBJECTIVE:  I am sore on my left side.  I love the pool, it really helps me.    PAIN:  Are you having pain Yes  NPRS scale:  7/10 Pain location: Lt lumbar and gluteals  Pain orientation: Bilateral  PAIN TYPE: aching Pain description: constant  Aggravating factors: when I sleep, using my cane, activity Relieving factors: laying down   OBJECTIVE: from evaluation on 10/27/21   DIAGNOSTIC FINDINGS:  Not recently   PATIENT SURVEYS:  FOTO 45%  12/21/21:  40 (pt is in a flare-up)                SENSATION: WFL to light touch    POSTURE:   Decreased lumbar lordosis, increased thoracic kyphosis; right shoulder elevated in sitting and standing with slight right shift;  hand tremor   PALPATION: Tender points in bil gluteals and lumbar paraspinals;  decreased thoraco lumbar fascial mobility   LUMBAR ROM:  Uses partial squat to pick up items from floor;  unable to tolerate supine lying but OK with right sidelying    Active  A/PROM  10/27/21  Flexion Not tested secondary to osteoporosis  Extension 0  Right lateral flexion    Left lateral flexion    Right rotation    Left rotation     (Blank rows = not tested) Trunk strength: decreased trunk extensor strength 4/5 and decreased activation of lower abdominals  LE ROM:  WFLs, right and left knee pain with full extension LE MMT:  Able to rise from standard chair without UE assist    LUMBAR SPECIAL TESTS:  Slump test: Negative   GAIT: Distance walked: 80 feet Assistive device utilized: Single point cane Level of assistance: Complete Independence Comments: shorter step length     TODAY'S TREATMENT  01/09/22:  Seated marching: 2x10 Long arc quads: 2x10- 5" hold Biceps curls and shoulder flexion 1# 2x10 bil each Shoulder flexion 1# 2x10 NuStep: level 1 x 6  minutes-PT present to discuss progress, stopped due to pain Seated hamstring stretch 2x20 seconds  Addaday to Lt gluteals and lumbar spine x 10 min   01/06/22:Pt arrives for aquatic physical therapy. Treatment took place in 3.5-5.5 feet of water. Water temperature was 91 degrees F.. Pt entered the pool via stairs with moderate use of rails stepping sideways. Pt requires buoyancy of water for support and to offload joints with strengthening exercises.   Seated water bench with 75% submersion Pt performed seated LE AROM exercises 20x in all planes, TC to help pt stay seated. Pain assessment concurrent. 75% depth forward water walking with large noodle 5x forward then no noodle ( no UE support) 5x, 4 length backwards and  sidestepping today.  Hip 3 ways 10x Bil with BilUE on wall for balance Heel raises 10x, steps ups on water bench 10x BilLE Seated decompression with square noodle, PTA holding LE down, intermittent LE movements: scissors 10x in each direction and a little flutter kicking about 1 min: repeat that series 3x  12/28/21: Pt arrives for aquatic physical therapy. Treatment took place in 3.5-5.5 feet of water. Water temperature was 91 degrees F.. Pt entered the pool via stairs with moderate use of rails stepping sideways. Pt requires buoyancy of water for support and to offload joints with strengthening exercises.   Seated water bench with 75% submersion Pt performed seated LE AROM exercises 20x in all planes, TC to help pt stay seated. Pain assessment concurrent. 75% depth forward water walking with large noodle 6 lengths, 4 length backwards and sidestepping today.  Hip 3 ways 10x Bil with BilUE on wall for balance Heel raises 10x, steps ups on water bench 10x BilLE Seated decompression with square noodle, PTA holding LE down, intermittent LE movements: scissors 10x in each direction and a little flutter kicking.     PATIENT EDUCATION:  Education details: [id number], aquatic info (11/22/21) Person educated: Patient Education method: Explanation Education comprehension: verbalized understanding     HOME EXERCISE PROGRAM: Access Code: [email or web address] URL: [email or web address] Date: 11/09/2021 Prepared by: Claiborne Billings  Exercises - Seated Hamstring Stretch  - 3 x daily - 7 x weekly - 1 sets - 3 reps - 20 hold - Seated Figure 4 Piriformis Stretch  - 1 x daily - 7 x weekly - 1 sets - 3 reps - 20 hold - Seated Hip Adduction Isometrics with Ball  - 2 x daily - 7 x weekly - 1 sets - 10 reps - 5 hold - Seated Hip Abduction with Resistance  - 2 x daily - 7 x weekly - 1 sets - 10 reps   ASSESSMENT:   CLINICAL IMPRESSION: Pt arrived with 7/10 pain in the Lt low back and gluteals today and was agreeable  to gentle exercise.  Pt reports that being in the water allow her to be painfree.  PT monitored pt throughout session for fatigue and pain.  Pt remains as active as she is able to  be at home.  She will often push it and do more than she should with housework, resulting in more pain. Pain is chronic and variable.  Patient will benefit from skilled PT to address the below impairments and improve overall function.    OBJECTIVE IMPAIRMENTS difficulty walking, decreased strength, increased fascial restrictions, and pain.     ACTIVITY LIMITATIONS cleaning, meal prep, and shopping.    PERSONAL FACTORS Age, Past/current experiences, Time since onset of injury/illness/exacerbation, and 1 comorbidity: Parkinson's, fibromyalgia, osteoporosis   are also affecting patient's functional outcome.      REHAB POTENTIAL: Good   CLINICAL DECISION MAKING: Stable/uncomplicated   EVALUATION COMPLEXITY: Low     GOALS: Goals reviewed with patient? Yes   SHORT TERM GOALS: Target date: 11/24/2021   The patient will demonstrate knowledge of basic self care strategies and exercises to promote healing and pain relief Baseline: Goal status: MET   2.  The patient will report a 30% improvement in pain levels with functional activities which are currently difficult including standing and walking for shopping Baseline: 60-65% (12/05/21) Goal status: MET   3.  The patient will be able to walk in the grocery store 15 minutes with the shopping cart without major exacerbation of pain Baseline:  30 minutes (12/21/21) Goal status: MET       LONG TERM GOALS: Target date: 02/03/22   The patient will be independent in a safe self progression of a home exercise program to promote further recovery of function   Baseline:  Goal status: In progress    2.  The patient will report a 75% improvement in pain levels with functional activities which are currently difficult including standing and walking Baseline: 60%  (12/21/21) Goal status: Revised    3.  The patient will be able to stand for light meal prep 8-10 min without exacerbation of pain Baseline:  able to stand for up to 15 min Goal status: MET (12/05/21)   4.   FOTO score improved from 45% to 52% indicating improved function with less pain Baseline: 40 Goal status: In progress         PLAN: PT FREQUENCY: 2x/week   PT DURATION: 8 weeks   PLANNED INTERVENTIONS: Therapeutic exercises, Therapeutic activity, Neuromuscular re-education, Balance training, Gait training, Patient/Family education, Joint mobilization, Aquatic Therapy, Dry Needling, Electrical stimulation, Moist heat, Taping, Ultrasound, Ionotophoresis 36m/ml Dexamethasone, and Manual therapy.   PLAN FOR NEXT SESSION:    Aquatics to manage pain and allow for improved mobility, exercise as able on land, manual for pain  KSigurd Sos PT 01/09/22 2:49 PM  [id number] [place] [id number] # [phone] [phone]

## 2022-01-10 ENCOUNTER — Ambulatory Visit: Payer: Medicare Other | Admitting: Psychiatry

## 2022-01-13 DIAGNOSIS — H04123 Dry eye syndrome of bilateral lacrimal glands: Secondary | ICD-10-CM | POA: Diagnosis not present

## 2022-01-13 DIAGNOSIS — H18831 Recurrent erosion of cornea, right eye: Secondary | ICD-10-CM | POA: Diagnosis not present

## 2022-01-16 ENCOUNTER — Other Ambulatory Visit: Payer: Medicare Other | Admitting: *Deleted

## 2022-01-16 ENCOUNTER — Ambulatory Visit: Payer: Medicare Other

## 2022-01-16 DIAGNOSIS — M25552 Pain in left hip: Secondary | ICD-10-CM

## 2022-01-16 DIAGNOSIS — M6281 Muscle weakness (generalized): Secondary | ICD-10-CM | POA: Diagnosis not present

## 2022-01-16 DIAGNOSIS — M5459 Other low back pain: Secondary | ICD-10-CM | POA: Diagnosis not present

## 2022-01-17 ENCOUNTER — Ambulatory Visit (INDEPENDENT_AMBULATORY_CARE_PROVIDER_SITE_OTHER): Payer: Medicare Other | Admitting: Psychiatry

## 2022-01-17 DIAGNOSIS — F411 Generalized anxiety disorder: Secondary | ICD-10-CM | POA: Diagnosis not present

## 2022-01-17 NOTE — Progress Notes (Addendum)
[facility] Counselor/Therapist Progress Note  Patient ID: Tracy Malone, MRN: [id number],    Date: 01/17/2022  Time Spent: 50 minutes   Virtual Visit via Telehealth Note : Telephone only, Pt unable to do Video session Connected with patient by a telemedicine/telehealth application, with their informed consent, and verified patient privacy and that I am speaking with the correct person using two identifiers. I discussed the limitations, risks, security and privacy concerns of performing psychotherapy and the availability of in person appointments. I also discussed with the patient that there may be a patient responsible charge related to this service. The patient expressed understanding and agreed to proceed. I discussed the treatment planning with the patient. The patient was provided an opportunity to ask questions and all were answered. The patient agreed with the plan and demonstrated an understanding of the instructions. The patient was advised to call  our office if  symptoms worsen or feel they are in a crisis state and need immediate contact.   Therapist Location: office Patient Location: home   Treatment Type: Individual Therapy  Reported Symptoms:  anxiety, depression  Mental Status Exam:  Appearance:   Casual     Behavior:  Appropriate and Sharing  Motor:  Affected by Parkinson's  Speech/Language:   Clear and Coherent  Affect:  Depressed and anxious  Mood:  anxious, depressed, and sad  Thought process:  goal directed (efforts)  Thought content:    Some rumination  Sensory/Perceptual disturbances:    WNL  Orientation:  oriented to person, place, time/date, situation, day of week, month of year, year, and stated date of January 17, 2022  Attention:  Good  Concentration:  Fair  Memory:  WNL  Fund of knowledge:   Good  Insight:    Good and Fair  Judgment:   Good  Impulse Control:  Good   Risk Assessment: Danger to Self:  No Self-injurious Behavior: No Danger to  Others: No Duty to Warn:no Physical Aggression / Violence:No  Access to Firearms a concern: No  Gang Involvement:No   Subjective:  Patient today reporting anxiety is stronger symptom, depression some better. Denies any SI. Not as lonely. Worries a lot and hard for her to change this because "I've always worried." Did discuss how she might try  interrupting her worrying thoughts so as to be able to look for what may go right versus wrong. Still going for therapy "for my back" and some neuropathy. Does get overwhelmed at times with her anxiety and states she tried strategies we've spoken about in therapy. Concerned about husband's health issues in addition to her own.  States she does not think they really want to go to an assisted living facility but may be considering it eventually.  Intermittent tearfulness which did get better over time as she talked through her concerns which seem to be very helpful to her today.  States that she is not sure whether she has "accepted that I have Parkinson's".  Processed this some more with patient and will check in with her again next time on this particular issue.  Encouraged her getting good sleep and staying on top of her medications as prescribed.   Interventions: Solution-Oriented/Positive Psychology, Ego-Supportive, and Insight-Oriented  Long term goal: Develop the ability to recognize, accept, and cope with feelings of anxiety and depression. Short term goal: Verbalize any unresolved grief issues that may be contributing to anxiety and depression. Strategy: Replace negative self-defeating self talk with verbalization of realistic and positive  cognitive messages to lessen depression/anxiety and improve mood.   Diagnosis:   ICD-10-CM   1. Generalized anxiety disorder  F41.1      Plan: Patient today showing good participation and was motivated through most of session talking about difficult issues related to her own health challenges and her husband's  health concerns.  Admits that she worries a lot and is always looking for what might go wrong versus right.  Discussed this more in session, giving some examples of how she might could let go of some of the negative patterns, seeing how that feeds her worries, but also heard her difficulties in changing her thoughts or ways of looking at things currently at her age.  Did encourage her to reach out a little more to friends, neighbors, and their church who could also be supportive of them and may not realize some of the health challenges with which patient and husband are trying to cope.  Did a good job today in talking through the issues above and did seem to feel more emotionally grounded by the end of session and the tearfulness has subsided.  Their family remains supportive.  Patient denies any SI.  States she will try to get good sleep and will remain on her medications as prescribed. Encouraged patient in her practice of positive behaviors discussed in sessions including: Listening to music which she finds very calming, positive self-care and self-talk, consider doing more activities that she enjoys and is able to do such as reading and talking with friends on the phone, getting books when possible from the nearby [employer] that she enjoys, not overly focusing on negatives, allow for good sleep patterns including not oversleeping during the day, remaining in the present focusing on what she can control or change, staying in contact with family members and friends who are supportive and willing to assist, and realize the strengths she shows working with goal directed behaviors to move in a direction that supports her improved emotional health and overall wellbeing.  Goal review and progress/challenges noted with patient.  Next appointment within 2-3 weeks.  This record has been created using [employer].  Chart creation errors have been sought, but may not always have been located and corrected.   Such creation errors do not reflect on the standard of medical care provided.   Shanon Ace, LCSW

## 2022-01-20 ENCOUNTER — Encounter: Payer: Self-pay | Admitting: Physical Therapy

## 2022-01-20 ENCOUNTER — Ambulatory Visit: Payer: Medicare Other | Admitting: Physical Therapy

## 2022-01-20 DIAGNOSIS — M25552 Pain in left hip: Secondary | ICD-10-CM

## 2022-01-20 DIAGNOSIS — M6281 Muscle weakness (generalized): Secondary | ICD-10-CM | POA: Diagnosis not present

## 2022-01-20 DIAGNOSIS — M5459 Other low back pain: Secondary | ICD-10-CM

## 2022-01-20 NOTE — Therapy (Signed)
OUTPATIENT PHYSICAL THERAPY TREATMENT NOTE   Patient Name: Tracy Malone MRN: [id number] DOB:1931-11-29, 88 y.o., female Today's Date: 01/20/2022  PCP: [name], MD REFERRING PROVIDER: Glennon Mac, DO   END OF SESSION:   PT End of Session - 01/20/22 1513     Visit Number 18    Date for PT Re-Evaluation 02/03/22    Authorization Type Medicare- KX at 15    Progress Note Due on Visit 89    PT Start Time 1345    PT Stop Time 1430    PT Time Calculation (min) 45 min    Activity Tolerance Patient tolerated treatment well    Behavior During Therapy [facility] for tasks assessed/performed                             Past Medical History:  Diagnosis Date   Bell's palsy 1966   Hx: right side facial droop, resolved per patient 04/02/19   Carotid artery disease ([place]) 2010   on vascular screening;unchanged 2013.(could not tolerate simvastatin, no other statins tried)--<30% blockage bilat 07/2011   Chronic abdominal pain    Chronic fatigue and malaise    Claustrophobia    Cyst of left ovary    last imaging 06/2021, benign, no further f/u   Depression    treated in the past for years;stopped in 2010 for a years   Duodenal ulcer 1962   h/o   Dysrhythmia    ocassional PVC's, no current problems per patient on 04/02/19   Fibromyalgia    Frequent PVCs 07/2012   Seen by [facility]: benign, asymptomatic, normal EF   Gallstones 02/2021   on [place]   GERD (gastroesophageal reflux disease)    diet controlled   Glaucoma, narrow-angle    s/p laser surgery   History of hiatal hernia    during endoscopy   Hypothyroid 03/2007   IBS (irritable bowel syndrome)    Dr. Benson [place]   Ischemic colitis ([place]) 11/21/2018   no current problems per patient on 0/2/63   Lichenoid keratosis 78/58/8502   Dr.Stinehelfer   Ocular migraine    Osteoporosis 04/2010   Dr.Hawkes; later consulted Dr. Buddy Duty (2022)   Panic attack    Parkinson disease ([place])    Parkinson's disease ([place]) 06/23/2016    Recurrent UTI    has cystocele-Dr.Grewal   Shingles 1999   h/o   Superficial thrombophlebitis 03/2009   RLE   Trochanteric bursitis 12/2008   bilateral   Past Surgical History:  Procedure Laterality Date   ABDOMINAL HYSTERECTOMY     BACK SURGERY  05/2019   CATARACT EXTRACTION, BILATERAL  1995, 1996   EYE SURGERY Bilateral    laser - glaucoma   Flexible sigmoidoscopy     KYPHOPLASTY N/A 04/03/2019   Procedure: KYPHOPLASTY L1;  Surgeon: Melina Schools, MD;  Location: [facility];  Service: Orthopedics;  Laterality: N/A;  90 mins   THYROIDECTOMY, PARTIAL  09/2005   L nodule; Dr. Harlow Asa   TONSILLECTOMY  1946   UPPER GI ENDOSCOPY  06/27/2012   VAGINAL HYSTERECTOMY  1971   and bladder repair.  Still has ovaries   WISDOM TOOTH EXTRACTION     Patient Active Problem List   Diagnosis Date Noted   Senile purpura ([place]) 11/04/2021   Calculus of gallbladder without cholecystitis without obstruction 11/01/2021   Rash 03/18/2021   Herpes zoster without complication 77/41/2878   Multiple allergies 03/18/2021   Frail elderly 03/18/2021  LUQ pain 10/29/2019   Poor appetite 10/29/2019   Loss of weight 10/29/2019   Nausea and vomiting 10/29/2019   Altered bowel habits 10/29/2019   Lumbar compression fracture (HCC) 04/03/2019   Compression fracture of L1 lumbar vertebra (HCC) 03/16/2019   Osteoporosis of multiple sites 03/16/2019   Essential tremor 02/06/2019   Ischemic colitis ([place])    Leukocytosis    Acute colitis 11/22/2018   Colitis 11/22/2018   Acute lower UTI 11/22/2018   Acute cystitis without hematuria    Parkinson's disease ([place]) 10/24/2018   Gait disturbance 11/28/2017   Chronic lymphocytic thyroiditis 04/24/2017   Status post removal of thyroid nodule 04/24/2017   Depression with anxiety 03/16/2017   Subcutaneous nodules 03/16/2017   Aortic atherosclerosis ([place]) 11/24/2016   Allergy to multiple antibiotics 10/07/2016   Seafood allergy, anaphylaxis, subsequent encounter  00/93/8182   Helicobacter pylori gastritis 10/07/2016   Fall 09/05/2016   Bloating 08/15/2016   Severe recurrent major depression without psychotic features ([place]) 04/13/2016    Class: Chronic   Rash and nonspecific skin eruption 03/01/2016   Leg pain, bilateral 03/01/2016   Functional dyspepsia 10/13/2015   Subacromial bursitis 03/02/2015   Resting tremor 02/01/2015   Epigastric fullness 10/12/2014   Early satiety 10/12/2014   Cystocele 12/30/2013   IBS (irritable bowel syndrome) 09/30/2013   GERD (gastroesophageal reflux disease) 06/27/2013   PVC's (premature ventricular contractions) 10/02/2012   Depressive disorder, not elsewhere classified 10/02/2012   Bradycardia 08/01/2012   Fatigue 08/01/2012   Anxiety state 05/13/2012   Osteopenia 05/13/2012   Hypothyroidism 05/13/2012    REFERRING DIAG:  M54.42,G89.29 (ICD-10-CM) - Chronic left-sided low back pain with left-sided sciatica  M51.36 (ICD-10-CM) - DDD (degenerative disc disease), lumbar  M16.12 (ICD-10-CM) - Primary osteoarthritis of left hip    THERAPY DIAG:  Other low back pain  Muscle weakness (generalized)  Pain in left hip  PERTINENT HISTORY: Parkinson's disease; L1 compression fracture 2020; osteoporosis; had nerve blocks in past and fell on left side; Osteoporosis;  fibromyalgia;  married 54 years; negative experience with previous PT  PRECAUTIONS: osteoporosis   SUBJECTIVE: My feet feel like lead today.  PAIN:  Are you having pain Yes  NPRS scale:  4-5/10 Pain location: Lt lumbar and gluteals  Pain orientation: Bilateral  PAIN TYPE: aching Pain description: constant  Aggravating factors: when I sleep, using my cane, activity Relieving factors: laying down   OBJECTIVE: from evaluation on 10/27/21   DIAGNOSTIC FINDINGS:  Not recently   PATIENT SURVEYS:  FOTO 45%  12/21/21:  40 (pt is in a flare-up)                SENSATION: WFL to light touch    POSTURE:  Decreased lumbar lordosis, increased  thoracic kyphosis; right shoulder elevated in sitting and standing with slight right shift;  hand tremor   PALPATION: Tender points in bil gluteals and lumbar paraspinals;  decreased thoraco lumbar fascial mobility   LUMBAR ROM:  Uses partial squat to pick up items from floor;  unable to tolerate supine lying but OK with right sidelying    Active  A/PROM  10/27/21  Flexion Not tested secondary to osteoporosis  Extension 0  Right lateral flexion    Left lateral flexion    Right rotation    Left rotation     (Blank rows = not tested) Trunk strength: decreased trunk extensor strength 4/5 and decreased activation of lower abdominals  LE ROM:  WFLs, right and left knee pain with  full extension LE MMT:  Able to rise from standard chair without UE assist    LUMBAR SPECIAL TESTS:  Slump test: Negative   GAIT: Distance walked: 80 feet Assistive device utilized: Single point cane Level of assistance: Complete Independence Comments: shorter step length     TODAY'S TREATMENT   01/20/22: Pt arrives for aquatic physical therapy. Treatment took place in 3.5-5.5 feet of water. Water temperature was 91 degrees F.. Pt entered the pool via stairs with moderate use of rails stepping sideways. Pt requires buoyancy of water for support and to offload joints with strengthening exercises.   Seated water bench with 75% submersion Pt performed seated LE AROM exercises 20x in all planes, TC to help pt stay seated. Pain assessment concurrent. 50% depth forward water walking with large noodle 8x forward., 6 length backwards and 6x sidestepping today.  Hip 3 ways 10x Bil with BilUE on wall for balance Heel raises 10x, steps ups on water bench 10x BilLE Seated decompression with square noodle, PTA holding LE down, intermittent LE movements: scissors 10x in each direction and a little flutter kicking about 2 min: repeat that series 3x . PTA with lateral sway to strtech lateral trunk.  01/16/22:  Seated marching:  2x10- added 2# Long arc quads: 2x10- 5" hold added 2#  Biceps curls and shoulder flexion 1# 2x10 bil each Shoulder flexion 1# 2x10 Seated hip abduction with blue band 2x10 NuStep: level 1 x 8  minutes-PT present to discuss progress, stopped due to pain Seated hamstring stretch 2x20 seconds  Addaday to Lt gluteals and lumbar spine x 10 min 01/09/22:  Seated marching: 2x10 Long arc quads: 2x10- 5" hold Biceps curls and shoulder flexion 1# 2x10 bil each Shoulder flexion 1# 2x10 NuStep: level 1 x 6  minutes-PT present to discuss progress, stopped due to pain Seated hamstring stretch 2x20 seconds  Addaday to Lt gluteals and lumbar spine x 10 min   01/06/22:Pt arrives for aquatic physical therapy. Treatment took place in 3.5-5.5 feet of water. Water temperature was 91 degrees F.. Pt entered the pool via stairs with moderate use of rails stepping sideways. Pt requires buoyancy of water for support and to offload joints with strengthening exercises.   Seated water bench with 75% submersion Pt performed seated LE AROM exercises 20x in all planes, TC to help pt stay seated. Pain assessment concurrent. 75% depth forward water walking with large noodle 5x forward then no noodle ( no UE support) 5x, 4 length backwards and sidestepping today.  Hip 3 ways 10x Bil with BilUE on wall for balance Heel raises 10x, steps ups on water bench 10x BilLE Seated decompression with square noodle, PTA holding LE down, intermittent LE movements: scissors 10x in each direction and a little flutter kicking about 1 min: repeat that series 3x   PATIENT EDUCATION:  Education details: [id number], aquatic info (11/22/21) Person educated: Patient Education method: Explanation Education comprehension: verbalized understanding     HOME EXERCISE PROGRAM: Access Code: [email or web address] URL: [email or web address] Date: 11/09/2021 Prepared by: Claiborne Billings  Exercises - Seated Hamstring Stretch  - 3 x daily - 7 x weekly - 1  sets - 3 reps - 20 hold - Seated Figure 4 Piriformis Stretch  - 1 x daily - 7 x weekly - 1 sets - 3 reps - 20 hold - Seated Hip Adduction Isometrics with Ball  - 2 x daily - 7 x weekly - 1 sets - 10 reps - 5 hold - Seated Hip Abduction with Resistance  -  2 x daily - 7 x weekly - 1 sets - 10 reps   ASSESSMENT:   CLINICAL IMPRESSION: Pt arrives with mild back pain but mostly complains of her feet feeling numb and very heavy. Pt tolerated small increases in most of her exercises today with no excessive fatigue or increased pain. Pain stayed about the same.     OBJECTIVE IMPAIRMENTS difficulty walking, decreased strength, increased fascial restrictions, and pain.     ACTIVITY LIMITATIONS cleaning, meal prep, and shopping.    PERSONAL FACTORS Age, Past/current experiences, Time since onset of injury/illness/exacerbation, and 1 comorbidity: Parkinson's, fibromyalgia, osteoporosis   are also affecting patient's functional outcome.      REHAB POTENTIAL: Good   CLINICAL DECISION MAKING: Stable/uncomplicated   EVALUATION COMPLEXITY: Low     GOALS: Goals reviewed with patient? Yes   SHORT TERM GOALS: Target date: 11/24/2021   The patient will demonstrate knowledge of basic self care strategies and exercises to promote healing and pain relief Baseline: Goal status: MET   2.  The patient will report a 30% improvement in pain levels with functional activities which are currently difficult including standing and walking for shopping Baseline: 60-65% (12/05/21) Goal status: MET   3.  The patient will be able to walk in the grocery store 15 minutes with the shopping cart without major exacerbation of pain Baseline:  30 minutes (12/21/21) Goal status: MET       LONG TERM GOALS: Target date: 02/03/22   The patient will be independent in a safe self progression of a home exercise program to promote further recovery of function   Baseline:  Goal status: In progress    2.  The patient will  report a 75% improvement in pain levels with functional activities which are currently difficult including standing and walking Baseline: 60% (12/21/21) Goal status: Revised    3.  The patient will be able to stand for light meal prep 8-10 min without exacerbation of pain Baseline:  able to stand for up to 15 min Goal status: MET (12/05/21)   4.   FOTO score improved from 45% to 52% indicating improved function with less pain Baseline: 40 Goal status: In progress         PLAN: PT FREQUENCY: 2x/week   PT DURATION: 8 weeks   PLANNED INTERVENTIONS: Therapeutic exercises, Therapeutic activity, Neuromuscular re-education, Balance training, Gait training, Patient/Family education, Joint mobilization, Aquatic Therapy, Dry Needling, Electrical stimulation, Moist heat, Taping, Ultrasound, Ionotophoresis 63m/ml Dexamethasone, and Manual therapy.   PLAN FOR NEXT SESSION:    Aquatics to manage pain and allow for improved mobility, exercise as able on land, manual for pain JMyrene Galas PTA 01/20/22 3:15 PM    [facility] [address] [place] [id number] # [phone] [phone]

## 2022-01-23 ENCOUNTER — Ambulatory Visit: Payer: Medicare Other | Attending: Sports Medicine

## 2022-01-23 DIAGNOSIS — R0781 Pleurodynia: Secondary | ICD-10-CM | POA: Insufficient documentation

## 2022-01-23 DIAGNOSIS — M25552 Pain in left hip: Secondary | ICD-10-CM | POA: Diagnosis not present

## 2022-01-23 DIAGNOSIS — M6281 Muscle weakness (generalized): Secondary | ICD-10-CM | POA: Insufficient documentation

## 2022-01-23 DIAGNOSIS — M5459 Other low back pain: Secondary | ICD-10-CM | POA: Insufficient documentation

## 2022-01-23 NOTE — Therapy (Signed)
OUTPATIENT PHYSICAL THERAPY TREATMENT NOTE   Patient Name: Tracy Malone MRN: [id number] DOB:June 24, 1932, 88 y.o., female Today's Date: 01/23/2022  PCP: [name], MD REFERRING PROVIDER: Glennon Mac, DO  Progress Note Reporting Period 12/21/21 to 01/23/22  See note below for Objective Data and Assessment of Progress/Goals.       END OF SESSION:   PT End of Session - 01/23/22 1439     Visit Number 19    Date for PT Re-Evaluation 02/03/22    Authorization Type Medicare- KX at 15    Progress Note Due on Visit 29    PT Start Time 1401    PT Stop Time 1445    PT Time Calculation (min) 44 min    Activity Tolerance Patient tolerated treatment well    Behavior During Therapy [facility] for tasks assessed/performed                              Past Medical History:  Diagnosis Date   Bell's palsy 1966   Hx: right side facial droop, resolved per patient 04/02/19   Carotid artery disease ([place]) 2010   on vascular screening;unchanged 2013.(could not tolerate simvastatin, no other statins tried)--<30% blockage bilat 07/2011   Chronic abdominal pain    Chronic fatigue and malaise    Claustrophobia    Cyst of left ovary    last imaging 06/2021, benign, no further f/u   Depression    treated in the past for years;stopped in 2010 for a years   Duodenal ulcer 1962   h/o   Dysrhythmia    ocassional PVC's, no current problems per patient on 04/02/19   Fibromyalgia    Frequent PVCs 07/2012   Seen by [facility]: benign, asymptomatic, normal EF   Gallstones 02/2021   on [place]   GERD (gastroesophageal reflux disease)    diet controlled   Glaucoma, narrow-angle    s/p laser surgery   History of hiatal hernia    during endoscopy   Hypothyroid 03/2007   IBS (irritable bowel syndrome)    Dr. Benson [place]   Ischemic colitis ([place]) 11/21/2018   no current problems per patient on 03/25/10   Lichenoid keratosis 94/17/4081   Dr.Stinehelfer   Ocular migraine    Osteoporosis 04/2010    Dr.Hawkes; later consulted Dr. Buddy Duty (2022)   Panic attack    Parkinson disease ([place])    Parkinson's disease ([place]) 06/23/2016   Recurrent UTI    has cystocele-Dr.Grewal   Shingles 1999   h/o   Superficial thrombophlebitis 03/2009   RLE   Trochanteric bursitis 12/2008   bilateral   Past Surgical History:  Procedure Laterality Date   ABDOMINAL HYSTERECTOMY     BACK SURGERY  05/2019   CATARACT EXTRACTION, BILATERAL  1995, 1996   EYE SURGERY Bilateral    laser - glaucoma   Flexible sigmoidoscopy     KYPHOPLASTY N/A 04/03/2019   Procedure: KYPHOPLASTY L1;  Surgeon: Melina Schools, MD;  Location: [facility];  Service: Orthopedics;  Laterality: N/A;  90 mins   THYROIDECTOMY, PARTIAL  09/2005   L nodule; Dr. Harlow Asa   TONSILLECTOMY  1946   UPPER GI ENDOSCOPY  06/27/2012   VAGINAL HYSTERECTOMY  1971   and bladder repair.  Still has ovaries   WISDOM TOOTH EXTRACTION     Patient Active Problem List   Diagnosis Date Noted   Senile purpura ([place]) 11/04/2021   Calculus of gallbladder without cholecystitis without obstruction  11/01/2021   Rash 03/18/2021   Herpes zoster without complication 07/26/7251   Multiple allergies 03/18/2021   Frail elderly 03/18/2021   LUQ pain 10/29/2019   Poor appetite 10/29/2019   Loss of weight 10/29/2019   Nausea and vomiting 10/29/2019   Altered bowel habits 10/29/2019   Lumbar compression fracture (HCC) 04/03/2019   Compression fracture of L1 lumbar vertebra (HCC) 03/16/2019   Osteoporosis of multiple sites 03/16/2019   Essential tremor 02/06/2019   Ischemic colitis ([place])    Leukocytosis    Acute colitis 11/22/2018   Colitis 11/22/2018   Acute lower UTI 11/22/2018   Acute cystitis without hematuria    Parkinson's disease ([place]) 10/24/2018   Gait disturbance 11/28/2017   Chronic lymphocytic thyroiditis 04/24/2017   Status post removal of thyroid nodule 04/24/2017   Depression with anxiety 03/16/2017   Subcutaneous nodules 03/16/2017   Aortic  atherosclerosis ([place]) 11/24/2016   Allergy to multiple antibiotics 10/07/2016   Seafood allergy, anaphylaxis, subsequent encounter 66/44/0347   Helicobacter pylori gastritis 10/07/2016   Fall 09/05/2016   Bloating 08/15/2016   Severe recurrent major depression without psychotic features ([place]) 04/13/2016    Class: Chronic   Rash and nonspecific skin eruption 03/01/2016   Leg pain, bilateral 03/01/2016   Functional dyspepsia 10/13/2015   Subacromial bursitis 03/02/2015   Resting tremor 02/01/2015   Epigastric fullness 10/12/2014   Early satiety 10/12/2014   Cystocele 12/30/2013   IBS (irritable bowel syndrome) 09/30/2013   GERD (gastroesophageal reflux disease) 06/27/2013   PVC's (premature ventricular contractions) 10/02/2012   Depressive disorder, not elsewhere classified 10/02/2012   Bradycardia 08/01/2012   Fatigue 08/01/2012   Anxiety state 05/13/2012   Osteopenia 05/13/2012   Hypothyroidism 05/13/2012    REFERRING DIAG:  M54.42,G89.29 (ICD-10-CM) - Chronic left-sided low back pain with left-sided sciatica  M51.36 (ICD-10-CM) - DDD (degenerative disc disease), lumbar  M16.12 (ICD-10-CM) - Primary osteoarthritis of left hip    THERAPY DIAG:  Other low back pain  Muscle weakness (generalized)  Pain in left hip  PERTINENT HISTORY: Parkinson's disease; L1 compression fracture 2020; osteoporosis; had nerve blocks in past and fell on left side; Osteoporosis;  fibromyalgia;  married 70 years; negative experience with previous PT  PRECAUTIONS: osteoporosis   SUBJECTIVE: I am tired of being in pain. I did too much in the pool.  It was my fault.   PAIN:  Are you having pain Yes  NPRS scale:  6/10 Pain location: Lt lumbar and gluteals  Pain orientation: Bilateral  PAIN TYPE: aching Pain description: constant  Aggravating factors: when I sleep, using my cane, activity Relieving factors: laying down   OBJECTIVE: from evaluation on 10/27/21   DIAGNOSTIC FINDINGS:  Not  recently   PATIENT SURVEYS:  FOTO 45%  12/21/21:  40 (pt is in a flare-up)     01/23/22: 56             SENSATION: WFL to light touch    POSTURE:  Decreased lumbar lordosis, increased thoracic kyphosis; right shoulder elevated in sitting and standing with slight right shift;  hand tremor   PALPATION: Tender points in bil gluteals and lumbar paraspinals;  decreased thoraco lumbar fascial mobility   LUMBAR ROM:  Uses partial squat to pick up items from floor;  unable to tolerate supine lying but OK with right sidelying    Active  A/PROM  10/27/21  Flexion Not tested secondary to osteoporosis  Extension 0  Right lateral flexion    Left lateral flexion  Right rotation    Left rotation     (Blank rows = not tested) Trunk strength: decreased trunk extensor strength 4/5 and decreased activation of lower abdominals  LE ROM:  WFLs, right and left knee pain with full extension LE MMT:  Able to rise from standard chair without UE assist    LUMBAR SPECIAL TESTS:  Slump test: Negative   GAIT: Distance walked: 80 feet Assistive device utilized: Single point cane Level of assistance: Complete Independence Comments: shorter step length     TODAY'S TREATMENT  01/23/22:  Seated marching: 2x10- added 2# Mini tramp: 3 way weight shifting x 1 min each Long arc quads: 2x10- 5" hold added 2#  Biceps curls 2# 2x10 bil each Shoulder flexion 1# 2x10 Seated hip abduction with blue band 2x10 NuStep: level 1 x 8  minutes-PT present to discuss progress Seated hamstring stretch 2x20 seconds      01/20/22: Pt arrives for aquatic physical therapy. Treatment took place in 3.5-5.5 feet of water. Water temperature was 91 degrees F.. Pt entered the pool via stairs with moderate use of rails stepping sideways. Pt requires buoyancy of water for support and to offload joints with strengthening exercises.   Seated water bench with 75% submersion Pt performed seated LE AROM exercises 20x in all planes, TC to  help pt stay seated. Pain assessment concurrent. 50% depth forward water walking with large noodle 8x forward., 6 length backwards and 6x sidestepping today.  Hip 3 ways 10x Bil with BilUE on wall for balance Heel raises 10x, steps ups on water bench 10x BilLE Seated decompression with square noodle, PTA holding LE down, intermittent LE movements: scissors 10x in each direction and a little flutter kicking about 2 min: repeat that series 3x . PTA with lateral sway to strtech lateral trunk.  01/16/22:  Seated marching: 2x10- added 2# Long arc quads: 2x10- 5" hold added 2#  Biceps curls and shoulder flexion 1# 2x10 bil each Shoulder flexion 1# 2x10 Seated hip abduction with blue band 2x10 NuStep: level 1 x 8  minutes-PT present to discuss progress, stopped due to pain Seated hamstring stretch 2x20 seconds  Addaday to Lt gluteals and lumbar spine x 10 min    PATIENT EDUCATION:  Education details: [id number], aquatic info (11/22/21) Person educated: Patient Education method: Explanation Education comprehension: verbalized understanding     HOME EXERCISE PROGRAM: Access Code: [email or web address] URL: [email or web address] Date: 11/09/2021 Prepared by: Claiborne Billings  Exercises - Seated Hamstring Stretch  - 3 x daily - 7 x weekly - 1 sets - 3 reps - 20 hold - Seated Figure 4 Piriformis Stretch  - 1 x daily - 7 x weekly - 1 sets - 3 reps - 20 hold - Seated Hip Adduction Isometrics with Ball  - 2 x daily - 7 x weekly - 1 sets - 10 reps - 5 hold - Seated Hip Abduction with Resistance  - 2 x daily - 7 x weekly - 1 sets - 10 reps   ASSESSMENT:   CLINICAL IMPRESSION: Pt arrived with some increased pain today.  Overall patient reports 65% improvement and is able to stand 40 minutes vs 30 as reported initially.  She was active this weekend with increased standing.  Pt reports that she overdid it at the pool because she felt so good in the water.  Pt did well with exercise in the clinic today and PT  monitored pt for pain, technique and fatigue.  Pt tolerated increased weights with biceps curls today. Patient  will benefit from skilled PT to address the below impairments and improve overall function.    OBJECTIVE IMPAIRMENTS difficulty walking, decreased strength, increased fascial restrictions, and pain.     ACTIVITY LIMITATIONS cleaning, meal prep, and shopping.    PERSONAL FACTORS Age, Past/current experiences, Time since onset of injury/illness/exacerbation, and 1 comorbidity: Parkinson's, fibromyalgia, osteoporosis   are also affecting patient's functional outcome.      REHAB POTENTIAL: Good   CLINICAL DECISION MAKING: Stable/uncomplicated   EVALUATION COMPLEXITY: Low     GOALS: Goals reviewed with patient? Yes   SHORT TERM GOALS: Target date: 11/24/2021   The patient will demonstrate knowledge of basic self care strategies and exercises to promote healing and pain relief Baseline: Goal status: MET   2.  The patient will report a 30% improvement in pain levels with functional activities which are currently difficult including standing and walking for shopping Baseline: 65% (01/23/22) Goal status: MET   3.  The patient will be able to walk in the grocery store 15 minutes with the shopping cart without major exacerbation of pain Baseline:  30 minutes (12/21/21) Goal status: MET       LONG TERM GOALS: Target date: 02/03/22   The patient will be independent in a safe self progression of a home exercise program to promote further recovery of function   Baseline:  Goal status: In progress    2.  The patient will report a 75% improvement in pain levels with functional activities which are currently difficult including standing and walking Baseline: 65% (01/23/22) Goal status: Revised    3.  The patient will be able to stand for light meal prep 8-10 min without exacerbation of pain Baseline:  able to stand for up to 15 min Goal status: MET (12/05/21)   4.   FOTO score  improved from 45% to 52% indicating improved function with less pain Baseline: 56 (01/23/22) Goal status: MET         PLAN: PT FREQUENCY: 2x/week   PT DURATION: 8 weeks   PLANNED INTERVENTIONS: Therapeutic exercises, Therapeutic activity, Neuromuscular re-education, Balance training, Gait training, Patient/Family education, Joint mobilization, Aquatic Therapy, Dry Needling, Electrical stimulation, Moist heat, Taping, Ultrasound, Ionotophoresis 76m/ml Dexamethasone, and Manual therapy.   PLAN FOR NEXT SESSION:    Aquatics to manage pain and allow for improved mobility, exercise as able on land, manual for pain KSigurd Sos PT 01/23/22 2:46 PM   [address] SNivervilleGTerminous [place] [address]Phone # [phone] [phone]

## 2022-01-25 ENCOUNTER — Encounter: Payer: Self-pay | Admitting: Adult Health

## 2022-01-25 ENCOUNTER — Ambulatory Visit (INDEPENDENT_AMBULATORY_CARE_PROVIDER_SITE_OTHER): Payer: Medicare Other | Admitting: Adult Health

## 2022-01-25 DIAGNOSIS — F410 Panic disorder [episodic paroxysmal anxiety] without agoraphobia: Secondary | ICD-10-CM

## 2022-01-25 DIAGNOSIS — F331 Major depressive disorder, recurrent, moderate: Secondary | ICD-10-CM

## 2022-01-25 DIAGNOSIS — G4700 Insomnia, unspecified: Secondary | ICD-10-CM

## 2022-01-25 DIAGNOSIS — F411 Generalized anxiety disorder: Secondary | ICD-10-CM

## 2022-01-25 MED ORDER — FLUOXETINE HCL 10 MG PO CAPS: 10.0000 mg | ORAL_CAPSULE | Freq: Two times a day (BID) | ORAL | 5 refills | Status: DC

## 2022-01-25 MED ORDER — ALPRAZOLAM 0.5 MG PO TABS: ORAL_TABLET | ORAL | 2 refills | Status: DC

## 2022-01-25 NOTE — Progress Notes (Signed)
Tracy Malone [id number] July 24, 1932 88 y.o.  Subjective:   Patient ID:  Tracy Malone is a 88 y.o. (DOB 1932/06/12) female.  Chief Complaint: No chief complaint on file.   HPI TAMBI THOLE presents to the office today for follow-up of MDD, GAD, panic attacks and insomnia.  Describes mood today as "not the best". Pleasant. Tearful - "at times". Mood symptoms - reports depression and anxiety. Increased irritability at times. Reports worry and rumination. Denies recent panic attacks. Feels like the addition of Prozac has been helpful. Would like to increase dose from '10mg'$  to '20mg'$  daily. Improved interest and motivation. Taking medications as prescribed. Seeing therapist - Rinaldo Cloud. Energy levels lower. Using a cane today. Active, does not have a regular exercise routine. Working with water therapy. Enjoys some usual interests and activities. Married. Lives with husband - 2 grown children. Spending time with family. Appetite decreased. Weight loss - 95.5 pounds Sleeping difficulties. Averages 6 hours. Reports some daytime napping. Focus and concentration stable. Completing tasks. Managing aspects of household. Retired.  Denies SI or HI.  Denies AH or VH.  Previous medication trials: Multiple medication trials    PHQ2-9    [facility] Office Visit from 11/02/2021 in [facility] Visit from 03/24/2021 in [facility] Visit from 09/08/2020 in [facility] Visit from 09/03/2019 in [facility] Telephone from 12/02/2018 in [facility]  PHQ-2 Total Score '6 6 6 6 1  '$ PHQ-9 Total Score '18 17 7 17 '$ --      Flowsheet Row ED from 03/07/2021 in [facility]  C-SSRS RISK CATEGORY No Risk        Review of Systems:  Review of Systems  Musculoskeletal:  Negative for gait problem.  Neurological:  Negative for tremors.  Psychiatric/Behavioral:         Please refer to  HPI    Medications: I have reviewed the patient's current medications.  Current Outpatient Medications  Medication Sig Dispense Refill   ALPRAZolam (XANAX) 0.5 MG tablet Take four tablets daily as needed for anxiety. 120 tablet 2   b complex vitamins tablet Take 1 tablet by mouth daily.     Calcium Citrate-Vitamin D (CALCIUM CITRATE +D PO) Take 1 tablet by mouth in the morning and at bedtime. Taking 400 mg of calcium and 500 units of vitamin D twice daily (total 800 mg calcium and 1000 units of vitamin D)     Carbidopa-Levodopa ER (SINEMET CR) 25-100 MG tablet controlled release TAKE ONE TABLET BY MOUTH EVERY MORNING; 1 TABLET AT NOON; AND 1 TABLET AT BEDTIME 270 tablet 1   dicyclomine (BENTYL) 10 MG capsule Take 1 capsule (10 mg total) by mouth every 6 (six) hours as needed. 30 capsule 0   FLUoxetine (PROZAC) 10 MG capsule Take 1 capsule (10 mg total) by mouth 2 (two) times daily. 60 capsule 5   Probiotic Product (ALIGN PO) Take 1 capsule by mouth daily.     SYNTHROID 25 MCG tablet TAKE 1 TABLET DAILY BEFORE BREAKFAST FOR HYPOTHYROIDISM 90 tablet 0   No current facility-administered medications for this visit.    Medication Side Effects: None  Allergies:  Allergies  Allergen Reactions   Iodinated Contrast Media Anaphylaxis   Iodine Anaphylaxis    IV and topical forms. Other reaction(s): Unknown   Levsin [Hyoscyamine Sulfate]     Vision problems/pt has glaucoma   Salmon [Fish Allergy] Hives and Shortness Of Breath  Shellfish Allergy Anaphylaxis   Tramadol Swelling   Remeron [Mirtazapine] Other (See Comments)    Cause blurred vision and red eyes, pt has glaucoma   Aspirin Other (See Comments)    Sever stomach pain due to ulcer scaring.   Bis Subcit-Metronid-Tetracyc Swelling    Tongue swelling. Face tingling Other reaction(s): Unknown   Cephalexin Hives    Other reaction(s): hives   Ciprofloxacin Diarrhea   Codeine Nausea And Vomiting   Cyclobenzaprine Other (See Comments)     Tingly/prickly sensation. Other reaction(s): tingly/prickly sensation   Darvocet [Propoxyphene N-Acetaminophen] Nausea And Vomiting   Demerol [Meperidine] Nausea Only   Dexlansoprazole Swelling    Redness, swelling and peeling of both feet. Other reaction(s): foot pain   Diphedryl [Diphenhydramine] Other (See Comments)    Increased pulse/small amount ok   Doxycycline Hyclate Other (See Comments)    GI intolerance.   Doxycycline Hyclate     Other reaction(s): GI intolerance   Epinephrine Other (See Comments)    Breathing problems Other reaction(s): breathing problems/fainting   Erythromycin Other (See Comments)    GI intolerance. Other reaction(s): GI   Fish Oil     Other reaction(s): breathing problems/hives   Hyoscyamine     Other reaction(s): eye pain   Latex Other (See Comments)    Gloves ok.  Skin gets red from elastic in underwear and latex bandaides.   Meperidine Hcl     Other reaction(s): vomiting   Nitrofurantoin Diarrhea   Other     Other reaction(s): migraines Other reaction(s): Unknown Other reaction(s): Unknown Other reaction(s): Unknown Other reaction(s): increased pulse, faint, diarrhea   Prednisone Other (See Comments)    Headache Other reaction(s): headache   Ra Diphedryl Allergy [Diphenhydramine Hcl]     Other reaction(s): increased pulse small dose okay   Sertraline Hcl Swelling and Other (See Comments)    Migraine Swelling of tongue/lip (09/2012) Other reaction(s): Unknown   Shellfish-Derived Products     Other reaction(s): Unknown   Sulfa Antibiotics Other (See Comments)    Increased pulse, fainting, diarrhea, thrush   Wellbutrin [Bupropion] Other (See Comments)    Headaches   Xylocaine [Lidocaine Hcl]     With epinephrine, given by dentist.  Speeded up heart rate and she passed out (occured twice, at dentist)   Xylocaine [Lidocaine]     Other reaction(s): Unknown   Biaxin [Clarithromycin] Rash    Started after completing 10 day course of  2000 mg /day, Lips swelling   Ibuprofen Other (See Comments)    Motrin ok with a GI effect. Other reaction(s): rash Motrin okay with a GI effect    Past Medical History:  Diagnosis Date   Bell's palsy 1966   Hx: right side facial droop, resolved per patient 04/02/19   Carotid artery disease ([place]) 2010   on vascular screening;unchanged 2013.(could not tolerate simvastatin, no other statins tried)--<30% blockage bilat 07/2011   Chronic abdominal pain    Chronic fatigue and malaise    Claustrophobia    Cyst of left ovary    last imaging 06/2021, benign, no further f/u   Depression    treated in the past for years;stopped in 2010 for a years   Duodenal ulcer 1962   h/o   Dysrhythmia    ocassional PVC's, no current problems per patient on 04/02/19   Fibromyalgia    Frequent PVCs 07/2012   Seen by [facility]: benign, asymptomatic, normal EF   Gallstones 02/2021   on [place]   GERD (gastroesophageal  reflux disease)    diet controlled   Glaucoma, narrow-angle    s/p laser surgery   History of hiatal hernia    during endoscopy   Hypothyroid 03/2007   IBS (irritable bowel syndrome)    Dr. Benson [place]   Ischemic colitis ([place]) 11/21/2018   no current problems per patient on 12/23/20   Lichenoid keratosis 97/98/9211   Dr.Stinehelfer   Ocular migraine    Osteoporosis 04/2010   Dr.Hawkes; later consulted Dr. Buddy Duty (2022)   Panic attack    Parkinson disease ([place])    Parkinson's disease ([place]) 06/23/2016   Recurrent UTI    has cystocele-Dr.Grewal   Shingles 1999   h/o   Superficial thrombophlebitis 03/2009   RLE   Trochanteric bursitis 12/2008   bilateral    Past Medical History, Surgical history, Social history, and Family history were reviewed and updated as appropriate.   Please see review of systems for further details on the patient's review from today.   Objective:   Physical Exam:  LMP  (LMP Unknown)   Physical Exam Constitutional:      General: She is not in acute  distress. Musculoskeletal:        General: No deformity.  Neurological:     Mental Status: She is alert and oriented to person, place, and time.     Coordination: Coordination normal.  Psychiatric:        Attention and Perception: Attention and perception normal. She does not perceive auditory or visual hallucinations.        Mood and Affect: Mood normal. Mood is not anxious or depressed. Affect is not labile, blunt, angry or inappropriate.        Speech: Speech normal.        Behavior: Behavior normal.        Thought Content: Thought content normal. Thought content is not paranoid or delusional. Thought content does not include homicidal or suicidal ideation. Thought content does not include homicidal or suicidal plan.        Cognition and Memory: Cognition and memory normal.        Judgment: Judgment normal.     Comments: Insight intact     Lab Review:     Component Value Date/Time   NA 142 11/02/2021 1117   K 4.1 11/02/2021 1117   CL 104 11/02/2021 1117   CO2 26 11/02/2021 1117   GLUCOSE 89 11/02/2021 1117   GLUCOSE 147 (H) 09/23/2021 1318   BUN 12 11/02/2021 1117   CREATININE 0.64 11/02/2021 1117   CREATININE 0.75 10/22/2018 1401   CREATININE 0.67 11/23/2016 0815   CALCIUM 10.0 11/02/2021 1117   PROT 6.4 11/02/2021 1117   ALBUMIN 4.4 11/02/2021 1117   AST 23 11/02/2021 1117   AST 17 10/22/2018 1401   ALT 4 11/02/2021 1117   ALT 7 10/22/2018 1401   ALKPHOS 69 11/02/2021 1117   BILITOT 1.3 (H) 11/02/2021 1117   BILITOT 0.8 10/22/2018 1401   GFRNONAA >60 03/07/2021 0755   GFRNONAA >60 10/22/2018 1401   GFRAA >60 03/16/2020 1153   GFRAA >60 10/22/2018 1401       Component Value Date/Time   WBC 6.1 09/23/2021 1318   RBC 4.21 09/23/2021 1318   HGB 12.8 09/23/2021 1318   HGB 13.4 03/10/2021 1547   HCT 38.5 09/23/2021 1318   HCT 39.5 03/10/2021 1547   PLT 261.0 09/23/2021 1318   PLT 266 03/10/2021 1547   MCV 91.5 09/23/2021 1318   MCV 89 03/10/2021 1547  MCH  30.1 03/10/2021 1547   MCH 30.4 03/07/2021 0755   MCHC 33.3 09/23/2021 1318   RDW 13.8 09/23/2021 1318   RDW 12.2 03/10/2021 1547   LYMPHSABS 1.8 09/23/2021 1318   LYMPHSABS 1.4 03/10/2021 1547   MONOABS 0.5 09/23/2021 1318   EOSABS 0.1 09/23/2021 1318   EOSABS 0.1 03/10/2021 1547   BASOSABS 0.1 09/23/2021 1318   BASOSABS 0.0 03/10/2021 1547    No results found for: "POCLITH", "LITHIUM"   No results found for: "PHENYTOIN", "PHENOBARB", "VALPROATE", "CBMZ"   .res Assessment: Plan:    Plan:  PDMP reviewed  1. Xanax 0.'5mg'$  - 4 x daily prn anxiety    2. Prozac '10mg'$  daily to twice daily    RTC 3 months  Time spent with patient was 20 minutes. Greater than 50% of face to face time with patient was spent on counseling and coordination of care.    Patient advised to contact office with any questions, adverse effects, or acute worsening in signs and symptoms.  Discussed potential benefits, risk, and side effects of benzodiazepines to include potential risk of tolerance and dependence, as well as possible drowsiness.  Advised patient not to drive if experiencing drowsiness and to take lowest possible effective dose to minimize risk of dependence and tolerance. Diagnoses and all orders for this visit:  Major depressive disorder, recurrent episode, moderate (HCC)  Generalized anxiety disorder -     ALPRAZolam (XANAX) 0.5 MG tablet; Take four tablets daily as needed for anxiety. -     FLUoxetine (PROZAC) 10 MG capsule; Take 1 capsule (10 mg total) by mouth 2 (two) times daily.  Panic attacks  Insomnia, unspecified type     Please see After Visit Summary for patient specific instructions.  Future Appointments  Date Time Provider [place]  01/26/2022 10:30 AM Marijo Conception, FNP [facility] None  01/27/2022 12:15 PM Altamese Dilling, PTA [facility] None  01/30/2022  1:00 PM Glennon Mac, DO [facility] None  01/31/2022 11:00 AM Shanon Ace, LCSW CP-CP None  02/14/2022  11:00 AM Shanon Ace, LCSW [facility] None  07/04/2022  3:30 PM Sater, Nanine Means, MD [facility] None    No orders of the defined types were placed in this encounter.   -------------------------------

## 2022-01-26 ENCOUNTER — Other Ambulatory Visit: Payer: Medicare Other | Admitting: Family Medicine

## 2022-01-26 ENCOUNTER — Encounter: Payer: Self-pay | Admitting: Family Medicine

## 2022-01-26 VITALS — BP 104/60 | HR 69 | Resp 16

## 2022-01-26 DIAGNOSIS — R634 Abnormal weight loss: Secondary | ICD-10-CM | POA: Diagnosis not present

## 2022-01-26 DIAGNOSIS — M5442 Lumbago with sciatica, left side: Secondary | ICD-10-CM | POA: Diagnosis not present

## 2022-01-26 DIAGNOSIS — G8929 Other chronic pain: Secondary | ICD-10-CM | POA: Diagnosis not present

## 2022-01-26 NOTE — Therapy (Deleted)
OUTPATIENT PHYSICAL THERAPY TREATMENT NOTE   Patient Name: Tracy Malone MRN: [id number] DOB:04/02/1932, 88 y.o., female Today's Date: 01/26/2022  PCP: Knapp, Eve, MD REFERRING PROVIDER: Jackson, Benjamin, DO  Progress Note Reporting Period 12/21/21 to 01/23/22  See note below for Objective Data and Assessment of Progress/Goals.       END OF SESSION:                      Past Medical History:  Diagnosis Date   Bell's palsy 1966   Hx: right side facial droop, resolved per patient 04/02/19   Carotid artery disease (HCC) 2010   on vascular screening;unchanged 2013.(could not tolerate simvastatin, no other statins tried)--<30% blockage bilat 07/2011   Chronic abdominal pain    Chronic fatigue and malaise    Claustrophobia    Cyst of left ovary    last imaging 06/2021, benign, no further f/u   Depression    treated in the past for years;stopped in 2010 for a years   Duodenal ulcer 1962   h/o   Dysrhythmia    ocassional PVC's, no current problems per patient on 04/02/19   Fibromyalgia    Frequent PVCs 07/2012   Seen by [facility]: benign, asymptomatic, normal EF   Gallstones 02/2021   on US   GERD (gastroesophageal reflux disease)    diet controlled   Glaucoma, narrow-angle    s/p laser surgery   History of hiatal hernia    during endoscopy   Hypothyroid 03/2007   IBS (irritable bowel syndrome)    Dr. Hung   Ischemic colitis (HCC) 11/21/2018   no current problems per patient on 04/02/19   Lichenoid keratosis 12/03/2020   Dr.Stinehelfer   Ocular migraine    Osteoporosis 04/2010   Dr.Hawkes; later consulted Dr. Kerr (2022)   Panic attack    Parkinson disease (HCC)    Parkinson's disease (HCC) 06/23/2016   Recurrent UTI    has cystocele-Dr.Grewal   Shingles 1999   h/o   Superficial thrombophlebitis 03/2009   RLE   Trochanteric bursitis 12/2008   bilateral   Past Surgical History:  Procedure Laterality Date   ABDOMINAL HYSTERECTOMY     BACK  SURGERY  05/2019   CATARACT EXTRACTION, BILATERAL  1995, 1996   EYE SURGERY Bilateral    laser - glaucoma   Flexible sigmoidoscopy     KYPHOPLASTY N/A 04/03/2019   Procedure: KYPHOPLASTY L1;  Surgeon: Brooks, Dahari, MD;  Location: [facility] OR;  Service: Orthopedics;  Laterality: N/A;  90 mins   THYROIDECTOMY, PARTIAL  09/2005   L nodule; Dr. Gerkin   TONSILLECTOMY  1946   UPPER GI ENDOSCOPY  06/27/2012   VAGINAL HYSTERECTOMY  1971   and bladder repair.  Still has ovaries   WISDOM TOOTH EXTRACTION     Patient Active Problem List   Diagnosis Date Noted   Senile purpura (HCC) 11/04/2021   Calculus of gallbladder without cholecystitis without obstruction 11/01/2021   Rash 03/18/2021   Herpes zoster without complication 03/18/2021   Multiple allergies 03/18/2021   Frail elderly 03/18/2021   LUQ pain 10/29/2019   Poor appetite 10/29/2019   Loss of weight 10/29/2019   Nausea and vomiting 10/29/2019   Altered bowel habits 10/29/2019   Lumbar compression fracture (HCC) 04/03/2019   Compression fracture of L1 lumbar vertebra (HCC) 03/16/2019   Osteoporosis of multiple sites 03/16/2019   Essential tremor 02/06/2019   Ischemic colitis (HCC)    Leukocytosis    Acute   colitis 11/22/2018   Colitis 11/22/2018   Acute lower UTI 11/22/2018   Acute cystitis without hematuria    Parkinson's disease (HCC) 10/24/2018   Gait disturbance 11/28/2017   Chronic lymphocytic thyroiditis 04/24/2017   Status post removal of thyroid nodule 04/24/2017   Depression with anxiety 03/16/2017   Subcutaneous nodules 03/16/2017   Aortic atherosclerosis (HCC) 11/24/2016   Allergy to multiple antibiotics 10/07/2016   Seafood allergy, anaphylaxis, subsequent encounter 10/07/2016   Helicobacter pylori gastritis 10/07/2016   Fall 09/05/2016   Bloating 08/15/2016   Severe recurrent major depression without psychotic features (HCC) 04/13/2016    Class: Chronic   Rash and nonspecific skin eruption 03/01/2016   Leg  pain, bilateral 03/01/2016   Functional dyspepsia 10/13/2015   Subacromial bursitis 03/02/2015   Resting tremor 02/01/2015   Epigastric fullness 10/12/2014   Early satiety 10/12/2014   Cystocele 12/30/2013   IBS (irritable bowel syndrome) 09/30/2013   GERD (gastroesophageal reflux disease) 06/27/2013   PVC's (premature ventricular contractions) 10/02/2012   Depressive disorder, not elsewhere classified 10/02/2012   Bradycardia 08/01/2012   Fatigue 08/01/2012   Anxiety state 05/13/2012   Osteopenia 05/13/2012   Hypothyroidism 05/13/2012    REFERRING DIAG:  M54.42,G89.29 (ICD-10-CM) - Chronic left-sided low back pain with left-sided sciatica  M51.36 (ICD-10-CM) - DDD (degenerative disc disease), lumbar  M16.12 (ICD-10-CM) - Primary osteoarthritis of left hip    THERAPY DIAG:  Other low back pain  Muscle weakness (generalized)  Pain in left hip  PERTINENT HISTORY: Parkinson's disease; L1 compression fracture 2020; osteoporosis; had nerve blocks in past and fell on left side; Osteoporosis;  fibromyalgia;  married 68 years; negative experience with previous PT  PRECAUTIONS: osteoporosis   SUBJECTIVE: I am tired of being in pain. I did too much in the pool.  It was my fault.   PAIN:  Are you having pain Yes  NPRS scale:  6/10 Pain location: Lt lumbar and gluteals  Pain orientation: Bilateral  PAIN TYPE: aching Pain description: constant  Aggravating factors: when I sleep, using my cane, activity Relieving factors: laying down   OBJECTIVE: from evaluation on 10/27/21   DIAGNOSTIC FINDINGS:  Not recently   PATIENT SURVEYS:  FOTO 45%  12/21/21:  40 (pt is in a flare-up)     01/23/22: 56             SENSATION: WFL to light touch    POSTURE:  Decreased lumbar lordosis, increased thoracic kyphosis; right shoulder elevated in sitting and standing with slight right shift;  hand tremor   PALPATION: Tender points in bil gluteals and lumbar paraspinals;  decreased thoraco  lumbar fascial mobility   LUMBAR ROM:  Uses partial squat to pick up items from floor;  unable to tolerate supine lying but OK with right sidelying    Active  A/PROM  10/27/21  Flexion Not tested secondary to osteoporosis  Extension 0  Right lateral flexion    Left lateral flexion    Right rotation    Left rotation     (Blank rows = not tested) Trunk strength: decreased trunk extensor strength 4/5 and decreased activation of lower abdominals  LE ROM:  WFLs, right and left knee pain with full extension LE MMT:  Able to rise from standard chair without UE assist    LUMBAR SPECIAL TESTS:  Slump test: Negative   GAIT: Distance walked: 80 feet Assistive device utilized: Single point cane Level of assistance: Complete Independence Comments: shorter step length     TODAY'S   TREATMENT   01/26/22:  Pt arrives for aquatic physical therapy. Treatment took place in 3.5-5.5 feet of water. Water temperature was 91 degrees F.. Pt entered the pool via stairs with moderate use of rails stepping sideways. Pt requires buoyancy of water for support and to offload joints with strengthening exercises.   Seated water bench with 75% submersion Pt performed seated LE AROM exercises 20x in all planes, TC to help pt stay seated. Pain assessment concurrent. 50% depth forward water walking with large noodle 8x forward., 6 length backwards and 6x sidestepping today.  Hip 3 ways 10x Bil with BilUE on wall for balance Heel raises 10x, steps ups on water bench 10x BilLE Seated decompression with square noodle, PTA holding LE down, intermittent LE movements: scissors 10x in each direction and a little flutter kicking about 2 min: repeat that series 3x . PTA with lateral sway to strtech lateral trunk. 01/23/22:  Seated marching: 2x10- added 2# Mini tramp: 3 way weight shifting x 1 min each Long arc quads: 2x10- 5" hold added 2#  Biceps curls 2# 2x10 bil each Shoulder flexion 1# 2x10 Seated hip abduction with blue  band 2x10 NuStep: level 1 x 8  minutes-PT present to discuss progress Seated hamstring stretch 2x20 seconds      01/20/22: Pt arrives for aquatic physical therapy. Treatment took place in 3.5-5.5 feet of water. Water temperature was 91 degrees F.. Pt entered the pool via stairs with moderate use of rails stepping sideways. Pt requires buoyancy of water for support and to offload joints with strengthening exercises.   Seated water bench with 75% submersion Pt performed seated LE AROM exercises 20x in all planes, TC to help pt stay seated. Pain assessment concurrent. 50% depth forward water walking with large noodle 8x forward., 6 length backwards and 6x sidestepping today.  Hip 3 ways 10x Bil with BilUE on wall for balance Heel raises 10x, steps ups on water bench 10x BilLE Seated decompression with square noodle, PTA holding LE down, intermittent LE movements: scissors 10x in each direction and a little flutter kicking about 2 min: repeat that series 3x . PTA with lateral sway to strtech lateral trunk.  01/16/22:  Seated marching: 2x10- added 2# Long arc quads: 2x10- 5" hold added 2#  Biceps curls and shoulder flexion 1# 2x10 bil each Shoulder flexion 1# 2x10 Seated hip abduction with blue band 2x10 NuStep: level 1 x 8  minutes-PT present to discuss progress, stopped due to pain Seated hamstring stretch 2x20 seconds  Addaday to Lt gluteals and lumbar spine x 10 min    PATIENT EDUCATION:  Education details: [id number], aquatic info (11/22/21) Person educated: Patient Education method: Explanation Education comprehension: verbalized understanding     HOME EXERCISE PROGRAM: Access Code: [email or web address] URL: [email or web address] Date: 11/09/2021 Prepared by: Claiborne Billings  Exercises - Seated Hamstring Stretch  - 3 x daily - 7 x weekly - 1 sets - 3 reps - 20 hold - Seated Figure 4 Piriformis Stretch  - 1 x daily - 7 x weekly - 1 sets - 3 reps - 20 hold - Seated Hip Adduction Isometrics  with Ball  - 2 x daily - 7 x weekly - 1 sets - 10 reps - 5 hold - Seated Hip Abduction with Resistance  - 2 x daily - 7 x weekly - 1 sets - 10 reps   ASSESSMENT:   CLINICAL IMPRESSION: Pt arrived with some increased pain today.  Overall patient reports 65% improvement and is able to stand  40 minutes vs 30 as reported initially.  She was active this weekend with increased standing.  Pt reports that she overdid it at the pool because she felt so good in the water.  Pt did well with exercise in the clinic today and PT monitored pt for pain, technique and fatigue.  Pt tolerated increased weights with biceps curls today. Patient will benefit from skilled PT to address the below impairments and improve overall function.    OBJECTIVE IMPAIRMENTS difficulty walking, decreased strength, increased fascial restrictions, and pain.     ACTIVITY LIMITATIONS cleaning, meal prep, and shopping.    PERSONAL FACTORS Age, Past/current experiences, Time since onset of injury/illness/exacerbation, and 1 comorbidity: Parkinson's, fibromyalgia, osteoporosis   are also affecting patient's functional outcome.      REHAB POTENTIAL: Good   CLINICAL DECISION MAKING: Stable/uncomplicated   EVALUATION COMPLEXITY: Low     GOALS: Goals reviewed with patient? Yes   SHORT TERM GOALS: Target date: 11/24/2021   The patient will demonstrate knowledge of basic self care strategies and exercises to promote healing and pain relief Baseline: Goal status: MET   2.  The patient will report a 30% improvement in pain levels with functional activities which are currently difficult including standing and walking for shopping Baseline: 65% (01/23/22) Goal status: MET   3.  The patient will be able to walk in the grocery store 15 minutes with the shopping cart without major exacerbation of pain Baseline:  30 minutes (12/21/21) Goal status: MET       LONG TERM GOALS: Target date: 02/03/22   The patient will be independent in a  safe self progression of a home exercise program to promote further recovery of function   Baseline:  Goal status: In progress    2.  The patient will report a 75% improvement in pain levels with functional activities which are currently difficult including standing and walking Baseline: 65% (01/23/22) Goal status: Revised    3.  The patient will be able to stand for light meal prep 8-10 min without exacerbation of pain Baseline:  able to stand for up to 15 min Goal status: MET (12/05/21)   4.   FOTO score improved from 45% to 52% indicating improved function with less pain Baseline: 56 (01/23/22) Goal status: MET         PLAN: PT FREQUENCY: 2x/week   PT DURATION: 8 weeks   PLANNED INTERVENTIONS: Therapeutic exercises, Therapeutic activity, Neuromuscular re-education, Balance training, Gait training, Patient/Family education, Joint mobilization, Aquatic Therapy, Dry Needling, Electrical stimulation, Moist heat, Taping, Ultrasound, Ionotophoresis 50m/ml Dexamethasone, and Manual therapy.   PLAN FOR NEXT SESSION:    Aquatics to manage pain and allow for improved mobility, exercise as able on land, manual for pain JMyrene Galas PTA 01/26/22 7:01 PM    [facility] [address] SHollow CreekGCoolin Elliott [address]Phone # [phone] [phone]

## 2022-01-26 NOTE — Progress Notes (Signed)
[facility] Palliative Care Consult Note Telephone: [phone]  Fax: [phone]    Date of encounter: 01/26/22 11:22 AM PATIENT NAME: Tracy Malone [place] [id number]   [phone] (home)  DOB: [birth year] MRN: [id number] PRIMARY CARE PROVIDER:    [name], MD,  [place] [address] [phone]  REFERRING PROVIDER:   Rita Malone, [address] [place],  [place] [address] [phone]  RESPONSIBLE PARTY:    Contact Information     Name Relation Home Work Mobile   Tracy Malone Spouse [phone]     Tracy Malone Daughter [phone]  [phone]        I met face to face with patient and spouse in their home. Palliative Care was asked to follow this patient by consultation request of  Tracy Ohara, MD to address advance care planning and complex medical decision making. This is a follow up visit.                           ASSESSMENT , SYMPTOM MANAGEMENT AND PLAN / RECOMMENDATIONS:   Chronic left sided low back pain with sciatica of left side Encouraged to continue use of TENs unit, alternate heat /ice for 15 min on/30 min off. Encouraged continued participation with PT and water exercise Use of lumbar roll when sitting  2,  Weight loss Stable with decreased appetite. Encouraged use of Protein supplement in smoothies/shakes Try to add nuts, beans, dairy and cheeses to foods to increase protein content to maintain    Advance Care Planning/Goals of Care: Goals include to maximize quality of life and symptom management.  Exploration of goals of care in the event of a sudden injury or illness-CPR without intubation Identification of a healthcare agent  Review of an existing advance directive document -MOST.  CODE STATUS: MOST as of 11/02/21: Attempt CPR with limited additional interventions, do not use intubation or mechanical ventilation Antibiotics if indicated IV fluids  and feeding tube for a defined trial period     Follow up Palliative Care Visit: Palliative care will continue to follow for complex medical decision making, advance care planning, and clarification of goals. Return 4-6 weeks or prn.    This visit was coded based on medical decision making (MDM).  PPS: 60%  HOSPICE ELIGIBILITY/DIAGNOSIS: TBD  Chief Complaint:  Palliative Care is following for chronic medical management in setting of Parkinson's disease and chronic back pain.  HISTORY OF PRESENT ILLNESS:  Tracy Malone is a 88 y.o. year old female with Parkinson's Disease, osteopenia, lumbar compression fracture, aortic atherosclerosis, GERD, IBS, functional dyspepsia, hx of colitis and ischemic colitis, chronic lymphocytic thyroiditis, cystocele, early satiety, depression with anxiety, low back pain with sciatica, bilateral leg pain and hx of trochanteric bursitis.  Pt states her back has been a problem since she had surgery 3 years ago.  She has pain at time referring from low back predominantly down buttock and left leg to knee or heel. Sometimes she states pain will cross the midline and travel down the opposite leg.  She has bowel control, has some rare urge incontinence if she can't get to the bathroom quickly enough.  She is independent with bathing, dressing and all ADLs.  She has had no falls.   Pain in low back and occasionally refers down left leg to knee and sometimes to heel.  Doing outpatient PT and aquatic therapy.  Using TENS unit with some relief. Back  pain interrupts sleep.  Pain is a 5 on 10 scale currently, at best is a 4 on a 10 scale and rarely the worst pain is 10/10.  Standing, sitting or laying for prolonged periods becomes painful.  Had gradual weight loss over 3 year period from 117 when she had back surgery to 97.4 lbs currently.  Eats small amounts, frequently due to delayed gastric emptying.  She has difficulty writing due to tremor and wants to look into a  procedure at [facility] where they treat the opposite side of the brain from the tremor with targeted tx (? Ablation).  She has had US done on her back previously with good results/pain relief, has also used dry needling and acupuncture.  She thinks that walking on a treadmill at a slow consistent pace will help improve her overall muscle function of BLE.  She states that PT is telling her she will not use the treadmill.  She is insistent about wanting to try.  Encouraged her to discuss the rationale behind not doing the treadmill with PT to see if there was a good rationale.  Explained that my guess would be balance issues and risk of falls.  History obtained from review of EMR, discussion with spouse and/or Tracy Malone.   Last labs 11/02/21 CMP remarkable only for mild total bilirubin elevation to 1.3.  Vitamin D sufficient at 48.9  TSH 10/06/21 normal at 2.180 Last CBC unremarkable 09/23/21  MRI lumbar spine without contrast 02/28/21: Disc levels:   T12- L1: Posttraumatic distortion of the disc space   L1-L2: Mild disc narrowing and bulging   L2-L3: Disc narrowing and mild bulging with small endplate spurs   J4-N8: Disc narrowing and bulging with endplate spurring.   L4-L5: Disc narrowing and bulging that is mild.   L5-S1:Disc narrowing and mild bulging. Mild-to-moderate facet spurring.   IMPRESSION: 1. 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 No explanation for extremity symptoms. 2. Scoliosis. 3. Remote and healed L1 compression fracture.  I reviewed available labs, medications, imaging, studies and related documents from the EMR.  Records reviewed and summarized above.   ROS General: NAD Cardiovascular: denies chest pain, denies DOE Pulmonary: denies cough, denies increased SOB Abdomen: endorses "fair" appetite without recent weight loss, endorses constipation, endorses continence of bowel GU: denies dysuria, endorses continence of urine with rare urge  incontinence MSK:  reports increased weakness after significant exertion, chronic low back pain and sciatic pain  no falls reported Neuro:  Low back pain with no numbness and intermittent bouts of insomnia due to back pain Psych: Endorses positive mood  Physical Exam: Current and past weights: stable at 97.4 lbs currently Constitutional: NAD General: thin, WD  EYES: anicteric sclera, lids intact, no discharge  ENMT: intact hearing, oral mucous membranes moist, dentition intact CV: S1S2, RRR, no LE edema Pulmonary: CTAB, no increased work of breathing, no cough, room air Abdomen:  normo-active BS + 4 quadrants, soft and non tender, no ascites MSK: no sarcopenia, moves all extremities, ambulatory Neuro:  no generalized weakness,  no cognitive impairment Psych: non-anxious affect, A and O x 3 Hem/lymph/immuno: no widespread bruising   Thank you for the opportunity to participate in the care of Ms. Wolanski.  The palliative care team will continue to follow. Please call our office at [phone] if we can be of additional assistance.   Marijo Conception, FNP   COVID-19 PATIENT SCREENING TOOL Asked and negative response unless otherwise noted:   Have you had symptoms of  covid, tested positive or been in contact with someone with symptoms/positive test in the past 5-10 days?  No

## 2022-01-27 ENCOUNTER — Ambulatory Visit: Payer: Medicare Other | Admitting: Physical Therapy

## 2022-01-27 DIAGNOSIS — M6281 Muscle weakness (generalized): Secondary | ICD-10-CM

## 2022-01-27 DIAGNOSIS — M25552 Pain in left hip: Secondary | ICD-10-CM

## 2022-01-27 DIAGNOSIS — M5459 Other low back pain: Secondary | ICD-10-CM

## 2022-01-27 NOTE — Progress Notes (Signed)
Tracy Malone D.[facility] [address] [place] [address] Phone: [phone]   Assessment and Plan:     1. Rib pain on left side 2. Chronic left-sided low back pain with left-sided sciatica 3. DDD (degenerative disc disease), lumbar 4. Closed fracture of multiple ribs of left side, sequela -Chronic with exacerbation, subsequent sports medicine visit - Multiple musculoskeletal complaints with most prominent being left flank related to healing chronic rib fractures. - Patient was receiving some benefit from aquatic therapy, but states that she overdid it 1 day and had a flare of pain.  I still feel that continuing aquatic therapy will be beneficial for patient long-term - Recommend increased use of Tylenol. Start Tylenol 500 to 1000 mg tablets 2-3 times a day for day-to-day pain relief  -May use Voltaren gel topically over pinpoint areas of pain - X-ray obtained in clinic.  My interpretation: No acute fracture or dislocation.  Evidence of healed rib fractures of left-sided false ribs with cortical thickening Pertinent previous records reviewed include none   Follow Up: 4 weeks for reevaluation.  Could consider advanced imaging if no improvement or worsening of symptoms   Subjective:   I, Tracy Malone, am serving as a Neurosurgeon for Doctor Richardean Sale   Chief Complaint: left side back pain   HPI:  10/13/2021 Patient is a 88 year old female complaining of left side back pain. Patient states had surgery on back 2 years ago pcp thinks its a muscle pain got a nerve block and fell and fx her ribs a year ago, back pain has been bothering her for about 2 years from PT had a bad experience does get radiating pain down to her leg feels like a spasm cant sit for more than 30 mins laying Is awful standing isnt great either pain also radiate around to the front would like PT    12/21/2021 Patient states that she had a bad night last night but went to PT  this morning Tens unit really helped , is having some sciatic pain , really enjoys aquatic therapy   01/30/2022 Patient states would like to continue with water therapy and the other , still sore from her rib pain when she fell     Relevant Historical Information: History of L1 vertebral fracture, Parkinson's with essential tremor  Additional pertinent review of systems negative.   Current Outpatient Medications:    ALPRAZolam (XANAX) 0.5 MG tablet, Take four tablets daily as needed for anxiety., Disp: 120 tablet, Rfl: 2   b complex vitamins tablet, Take 1 tablet by mouth daily., Disp: , Rfl:    Calcium Citrate-Vitamin D (CALCIUM CITRATE +D PO), Take 1 tablet by mouth in the morning and at bedtime. Taking 400 mg of calcium and 500 units of vitamin D twice daily (total 800 mg calcium and 1000 units of vitamin D), Disp: , Rfl:    Carbidopa-Levodopa ER (SINEMET CR) 25-100 MG tablet controlled release, TAKE ONE TABLET BY MOUTH EVERY MORNING; 1 TABLET AT NOON; AND 1 TABLET AT BEDTIME (Patient taking differently: 1 tablet in the morning, at noon, in the evening, and at bedtime.), Disp: 270 tablet, Rfl: 1   dicyclomine (BENTYL) 10 MG capsule, Take 1 capsule (10 mg total) by mouth every 6 (six) hours as needed., Disp: 30 capsule, Rfl: 0   FLUoxetine (PROZAC) 10 MG capsule, Take 1 capsule (10 mg total) by mouth 2 (two) times daily., Disp: 60 capsule, Rfl: 5   Probiotic Product (  ALIGN PO), Take 1 capsule by mouth daily., Disp: , Rfl:    SYNTHROID 25 MCG tablet, TAKE 1 TABLET DAILY BEFORE BREAKFAST FOR HYPOTHYROIDISM, Disp: 90 tablet, Rfl: 0   Objective:     Vitals:   01/30/22 1250  Pulse: 74  SpO2: 99%  Weight: 97 lb (44 kg)  Height: 4\' 11"  (1.499 m)      Body mass index is 19.59 kg/m.    Physical Exam:    Gen: Appears well, nad, nontoxic and pleasant.  Baseline tremor with history of Parkinson Psych: Alert and oriented, appropriate mood and affect Neuro: sensation intact, strength is 5/5  in upper and lower extremities, muscle tone wnl Skin: no susupicious lesions or rashes   Back - Normal skin, Spine with normal alignment and no deformity.   No tenderness to vertebral process palpation.   Left-sided lumbar paraspinous muscles are moderately tender and without spasm TTP left flank without flail chest     Electronically signed by:  Tracy Malone [facility] 1:32 PM 01/30/22

## 2022-01-30 ENCOUNTER — Ambulatory Visit (INDEPENDENT_AMBULATORY_CARE_PROVIDER_SITE_OTHER): Payer: Medicare Other | Admitting: Sports Medicine

## 2022-01-30 ENCOUNTER — Ambulatory Visit (INDEPENDENT_AMBULATORY_CARE_PROVIDER_SITE_OTHER): Payer: Medicare Other

## 2022-01-30 VITALS — HR 74 | Ht 59.0 in | Wt 97.0 lb

## 2022-01-30 DIAGNOSIS — R0781 Pleurodynia: Secondary | ICD-10-CM

## 2022-01-30 DIAGNOSIS — S2242XS Multiple fractures of ribs, left side, sequela: Secondary | ICD-10-CM | POA: Diagnosis not present

## 2022-01-30 DIAGNOSIS — M5442 Lumbago with sciatica, left side: Secondary | ICD-10-CM | POA: Diagnosis not present

## 2022-01-30 DIAGNOSIS — G8929 Other chronic pain: Secondary | ICD-10-CM | POA: Diagnosis not present

## 2022-01-30 DIAGNOSIS — M5136 Other intervertebral disc degeneration, lumbar region: Secondary | ICD-10-CM | POA: Diagnosis not present

## 2022-01-30 NOTE — Patient Instructions (Addendum)
Good to see you  Pt referral  Tylenol [phone] mg 3 times a day for pain relief  Voltaren gel over painful areas  4-5 week follow up

## 2022-01-31 ENCOUNTER — Ambulatory Visit (INDEPENDENT_AMBULATORY_CARE_PROVIDER_SITE_OTHER): Payer: Medicare Other | Admitting: Psychiatry

## 2022-01-31 DIAGNOSIS — F411 Generalized anxiety disorder: Secondary | ICD-10-CM | POA: Diagnosis not present

## 2022-01-31 NOTE — Progress Notes (Signed)
[facility] Counselor/Therapist Progress Note  Patient ID: Tracy Malone, MRN: [id number],    Date: 01/31/2022  Time Spent: 48 minutes   Treatment Type: Individual Therapy  Reported Symptoms: anxiety, depression, coping with Parkinson's  Mental Status Exam:  Appearance:   Casual     Behavior:  Appropriate, Sharing, and Motivated  Motor:  Uses cane to walk  Speech/Language:   Clear and Coherent  Affect:  Tearful and sad, anxious, depressed  Mood:  anxious, depressed, and sad  Thought process:  Some tangentiality  Thought content:    Rumination  Sensory/Perceptual disturbances:    WNL  Orientation:  oriented to person, place, time/date, situation, day of week, month of year, year, and stated date of January 31, 2022  Attention:  Good  Concentration:  Good and Fair  Memory:  WNL  Fund of knowledge:   Good  Insight:    Fair  Judgment:   Good  Impulse Control:  Good   Risk Assessment: Danger to Self:  No Self-injurious Behavior: No Danger to Others: No Duty to Warn:no Physical Aggression / Violence:No  Access to Firearms a concern: No  Gang Involvement:No   Subjective:  Patient today in office today reporting anxiety, depression, sadness, tearfulness, and was able to talk tearfully through a lot of things today that are contributing to those feelings including her health, her son out of state, her husband's health, and some isolation.  Denies any SI.  Supported her processing the issues stated above and also encouraged her and husband as they are able to get out a little more including in their neighborhood with other senior adults, and to a nearby [employer] that they both have interest in.  Was much more stable emotionally by session and and agreed to consider getting out a little more as they are able physically and as recommended by other physician.  Encouraged her to try to get more regular sleep and maybe not sleep quite as much during the day so as to hopefully  sleep better at night.  Interventions: Cognitive Behavioral Therapy and Solution-Oriented/Positive Psychology  Long term goal: Develop the ability to recognize, accept, and cope with feelings of anxiety and depression. Short term goal: Verbalize any unresolved grief issues that may be contributing to anxiety and depression. Strategy: Replace negative self-defeating self talk with verbalization of realistic and positive cognitive messages to lessen depression/anxiety and improve mood.   Diagnosis:   ICD-10-CM   1. Generalized anxiety disorder  F41.1      Plan:  Patient today showing good motivation and participation as she talked through a lot of her depressive and anxious thoughts as noted above.  This did seem to be helpful to her and she was more calm and not tearful by the end of session.  Reviewed some goal-directed behaviors that can help her more with her anxiety and depressive thoughts and feelings and she is in agreement to try some suggested behaviors as well as getting out of their house as they are able to be around other people especially in their neighborhood where a lot of senior adults live close by and are often outside in good weather.  Excessive worrying and we spoke about this some today as well and how it takes her away from feeling more positive about some of her blessings that she was able to name towards the end of session, including "family, friends, and good medical care".  Extended family remains supportive and some live locally.  Encouraged to remain on her medications as prescribed and try sleeping a little less during the day as she wants to be able to sleep better at night and reportedly does on some occasions. Encouraged patient in her practice of positive behaviors as discussed in session including: Positive self-care and self-talk, listening to music which she finds very calming, consider doing more activities that she enjoys and is able to do such as reading and  talking with friends on the phone, getting books when possible from the nearby [employer] which she enjoys, not overly focusing on negatives, allow for good sleep patterns including not oversleeping during the day, remaining in the present focusing on what she can control or change, remaining contact with family members and friends who are supportive and willing to assist as needed, and recognize the strength she shows working with goal directed behaviors to move in a direction that supports her improved emotional health and overall outlook  Goal review and progress/challenges noted with patient.  Next appointment within 3 weeks.  This record has been created using [employer].  Chart creation errors have been sought, but may not always have been located and corrected.  Such creation errors do not reflect on the standard of medical care provided.   Shanon Ace, LCSW

## 2022-02-03 DIAGNOSIS — M810 Age-related osteoporosis without current pathological fracture: Secondary | ICD-10-CM | POA: Diagnosis not present

## 2022-02-03 DIAGNOSIS — L659 Nonscarring hair loss, unspecified: Secondary | ICD-10-CM | POA: Diagnosis not present

## 2022-02-03 DIAGNOSIS — E039 Hypothyroidism, unspecified: Secondary | ICD-10-CM | POA: Diagnosis not present

## 2022-02-03 DIAGNOSIS — Z8781 Personal history of (healed) traumatic fracture: Secondary | ICD-10-CM | POA: Diagnosis not present

## 2022-02-03 DIAGNOSIS — E063 Autoimmune thyroiditis: Secondary | ICD-10-CM | POA: Diagnosis not present

## 2022-02-08 ENCOUNTER — Ambulatory Visit: Payer: Medicare Other

## 2022-02-08 DIAGNOSIS — M6281 Muscle weakness (generalized): Secondary | ICD-10-CM | POA: Diagnosis not present

## 2022-02-08 DIAGNOSIS — M25552 Pain in left hip: Secondary | ICD-10-CM | POA: Diagnosis not present

## 2022-02-08 DIAGNOSIS — M5459 Other low back pain: Secondary | ICD-10-CM | POA: Diagnosis not present

## 2022-02-08 DIAGNOSIS — R0781 Pleurodynia: Secondary | ICD-10-CM | POA: Diagnosis not present

## 2022-02-08 NOTE — Therapy (Signed)
OUTPATIENT PHYSICAL THERAPY TREATMENT NOTE   Patient Name: Tracy Malone MRN: [id number] DOB:1932/04/17, 88 y.o., female Today's Date: 02/08/2022  PCP: [name], MD REFERRING PROVIDER: Glennon Mac, DO  Progress Note Reporting Period 12/21/21 to 01/23/22  See note below for Objective Data and Assessment of Progress/Goals.       END OF SESSION:   PT End of Session - 02/08/22 1658     Visit Number 20    Date for PT Re-Evaluation 04/05/22    Authorization Type Medicare- KX at 15    Progress Note Due on Visit 29    PT Start Time 1617    PT Stop Time 1700    PT Time Calculation (min) 43 min    Activity Tolerance Patient tolerated treatment well    Behavior During Therapy [facility] for tasks assessed/performed                               Past Medical History:  Diagnosis Date   Bell's palsy 1966   Hx: right side facial droop, resolved per patient 04/02/19   Carotid artery disease ([place]) 2010   on vascular screening;unchanged 2013.(could not tolerate simvastatin, no other statins tried)--<30% blockage bilat 07/2011   Chronic abdominal pain    Chronic fatigue and malaise    Claustrophobia    Cyst of left ovary    last imaging 06/2021, benign, no further f/u   Depression    treated in the past for years;stopped in 2010 for a years   Duodenal ulcer 1962   h/o   Dysrhythmia    ocassional PVC's, no current problems per patient on 04/02/19   Fibromyalgia    Frequent PVCs 07/2012   Seen by [facility]: benign, asymptomatic, normal EF   Gallstones 02/2021   on [place]   GERD (gastroesophageal reflux disease)    diet controlled   Glaucoma, narrow-angle    s/p laser surgery   History of hiatal hernia    during endoscopy   Hypothyroid 03/2007   IBS (irritable bowel syndrome)    Dr. Benson [place]   Ischemic colitis ([place]) 11/21/2018   no current problems per patient on 11/24/60   Lichenoid keratosis 70/35/0093   Dr.Stinehelfer   Ocular migraine    Osteoporosis  04/2010   Dr.Hawkes; later consulted Dr. Buddy Duty (2022)   Panic attack    Parkinson disease ([place])    Parkinson's disease ([place]) 06/23/2016   Recurrent UTI    has cystocele-Dr.Grewal   Shingles 1999   h/o   Superficial thrombophlebitis 03/2009   RLE   Trochanteric bursitis 12/2008   bilateral   Past Surgical History:  Procedure Laterality Date   ABDOMINAL HYSTERECTOMY     BACK SURGERY  05/2019   CATARACT EXTRACTION, BILATERAL  1995, 1996   EYE SURGERY Bilateral    laser - glaucoma   Flexible sigmoidoscopy     KYPHOPLASTY N/A 04/03/2019   Procedure: KYPHOPLASTY L1;  Surgeon: Melina Schools, MD;  Location: [facility];  Service: Orthopedics;  Laterality: N/A;  90 mins   THYROIDECTOMY, PARTIAL  09/2005   L nodule; Dr. Harlow Asa   TONSILLECTOMY  1946   UPPER GI ENDOSCOPY  06/27/2012   VAGINAL HYSTERECTOMY  1971   and bladder repair.  Still has ovaries   WISDOM TOOTH EXTRACTION     Patient Active Problem List   Diagnosis Date Noted   Chronic left-sided low back pain with left-sided sciatica 01/26/2022   Senile  purpura ([place]) 11/04/2021   Calculus of gallbladder without cholecystitis without obstruction 11/01/2021   Rash 03/18/2021   Herpes zoster without complication 98/05/9146   Multiple allergies 03/18/2021   Frail elderly 03/18/2021   LUQ pain 10/29/2019   Poor appetite 10/29/2019   Loss of weight 10/29/2019   Nausea and vomiting 10/29/2019   Altered bowel habits 10/29/2019   Lumbar compression fracture ([place]) 04/03/2019   Compression fracture of L1 lumbar vertebra ([place]) 03/16/2019   Osteoporosis of multiple sites 03/16/2019   Essential tremor 02/06/2019   Ischemic colitis ([place])    Leukocytosis    Acute colitis 11/22/2018   Colitis 11/22/2018   Acute lower UTI 11/22/2018   Acute cystitis without hematuria    Parkinson's disease ([place]) 10/24/2018   Gait disturbance 11/28/2017   Chronic lymphocytic thyroiditis 04/24/2017   Status post removal of thyroid nodule 04/24/2017    Depression with anxiety 03/16/2017   Subcutaneous nodules 03/16/2017   Aortic atherosclerosis ([place]) 11/24/2016   Allergy to multiple antibiotics 10/07/2016   Seafood allergy, anaphylaxis, subsequent encounter 82/95/6213   Helicobacter pylori gastritis 10/07/2016   Fall 09/05/2016   Bloating 08/15/2016   Severe recurrent major depression without psychotic features ([place]) 04/13/2016    Class: Chronic   Rash and nonspecific skin eruption 03/01/2016   Leg pain, bilateral 03/01/2016   Functional dyspepsia 10/13/2015   Subacromial bursitis 03/02/2015   Resting tremor 02/01/2015   Epigastric fullness 10/12/2014   Early satiety 10/12/2014   Cystocele 12/30/2013   IBS (irritable bowel syndrome) 09/30/2013   GERD (gastroesophageal reflux disease) 06/27/2013   PVC's (premature ventricular contractions) 10/02/2012   Depressive disorder, not elsewhere classified 10/02/2012   Bradycardia 08/01/2012   Fatigue 08/01/2012   Anxiety state 05/13/2012   Osteopenia 05/13/2012   Hypothyroidism 05/13/2012    REFERRING DIAG:  M54.42,G89.29 (ICD-10-CM) - Chronic left-sided low back pain with left-sided sciatica  M51.36 (ICD-10-CM) - DDD (degenerative disc disease), lumbar  M16.12 (ICD-10-CM) - Primary osteoarthritis of left hip    THERAPY DIAG:  Other low back pain - Plan: PT plan of care cert/re-cert  Muscle weakness (generalized) - Plan: PT plan of care cert/re-cert  Pain in left hip - Plan: PT plan of care cert/re-cert  PERTINENT HISTORY: Parkinson's disease; L1 compression fracture 2020; osteoporosis; had nerve blocks in past and fell on left side; Osteoporosis;  fibromyalgia;  married 80 years; negative experience with previous PT  PRECAUTIONS: osteoporosis   SUBJECTIVE:I feel 60% better since the start of care.  I have been doing my exercises at home.    PAIN:  Are you having pain Yes  NPRS scale:  6/10 Pain location: Lt lumbar and gluteals  Pain orientation: Bilateral  PAIN TYPE:  aching Pain description: constant  Aggravating factors: when I sleep, using my cane, activity Relieving factors: laying down   OBJECTIVE: from evaluation on 10/27/21   DIAGNOSTIC FINDINGS:  Not recently   PATIENT SURVEYS:  FOTO 45%  12/21/21:  40 (pt is in a flare-up)     01/23/22: 56              SENSATION: WFL to light touch    POSTURE:  Decreased lumbar lordosis, increased thoracic kyphosis; right shoulder elevated in sitting and standing with slight right shift;  hand tremor   PALPATION: Tender points in bil gluteals and lumbar paraspinals;  decreased thoraco lumbar fascial mobility   LUMBAR ROM:  Uses partial squat to pick up items from floor;  unable to tolerate supine lying but OK  with right sidelying    Active  A/PROM  10/27/21  Flexion Not tested secondary to osteoporosis  Extension 0  Right lateral flexion    Left lateral flexion    Right rotation    Left rotation     (Blank rows = not tested) Trunk strength: decreased trunk extensor strength 4/5 and decreased activation of lower abdominals  LE ROM:  WFLs, right and left knee pain with full extension LE MMT:  Able to rise from standard chair without UE assist    LUMBAR SPECIAL TESTS:  Slump test: Negative   GAIT: Distance walked: 300 feet Assistive device utilized: Single point cane Level of assistance: Complete Independence Comments: shorter step length   02/08/22:  Functional tests:  5x sit to stand: 16 seconds  3 minute walk test: 315 feet using cane   TODAY'S TREATMENT  02/08/22:   Mini tramp: 3 way weight shifting x 1 min each Biceps curls 3# 2x10 bil each Shoulder flexion 1# 2x10 Seated hip abduction with blue band 2x10 NuStep: level 1 x 10  minutes-PT present to discuss progress Seated hamstring stretch 2x20 seconds  01/23/22:  Seated marching: 2x10- added 2# Mini tramp: 3 way weight shifting x 1 min each Long arc quads: 2x10- 5" hold added 2#  Biceps curls 2# 2x10 bil each Shoulder flexion 1#  2x10 Seated hip abduction with blue band 2x10 NuStep: level 1 x 8  minutes-PT present to discuss progress Seated hamstring stretch 2x20 seconds      01/20/22: Pt arrives for aquatic physical therapy. Treatment took place in 3.5-5.5 feet of water. Water temperature was 91 degrees F.. Pt entered the pool via stairs with moderate use of rails stepping sideways. Pt requires buoyancy of water for support and to offload joints with strengthening exercises.   Seated water bench with 75% submersion Pt performed seated LE AROM exercises 20x in all planes, TC to help pt stay seated. Pain assessment concurrent. 50% depth forward water walking with large noodle 8x forward., 6 length backwards and 6x sidestepping today.  Hip 3 ways 10x Bil with BilUE on wall for balance Heel raises 10x, steps ups on water bench 10x BilLE Seated decompression with square noodle, PTA holding LE down, intermittent LE movements: scissors 10x in each direction and a little flutter kicking about 2 min: repeat that series 3x . PTA with lateral sway to strtech lateral trunk.  PATIENT EDUCATION:  Education details: [id number], aquatic info (11/22/21) Person educated: Patient Education method: Explanation Education comprehension: verbalized understanding     HOME EXERCISE PROGRAM: Access Code: [email or web address] URL: [email or web address] Date: 11/09/2021 Prepared by: Claiborne Billings  Exercises - Seated Hamstring Stretch  - 3 x daily - 7 x weekly - 1 sets - 3 reps - 20 hold - Seated Figure 4 Piriformis Stretch  - 1 x daily - 7 x weekly - 1 sets - 3 reps - 20 hold - Seated Hip Adduction Isometrics with Ball  - 2 x daily - 7 x weekly - 1 sets - 10 reps - 5 hold - Seated Hip Abduction with Resistance  - 2 x daily - 7 x weekly - 1 sets - 10 reps   ASSESSMENT:   CLINICAL IMPRESSION: Pt with lapse in treatment since 01/24/11.  Overall patient reports 60-65% improvement and reports intermittent and variable pain. Pt is able to stand longer  for ADLs and is limited with walking in the community.   Pt performed 5x sit to stand in 16 seconds, indicating a falls risk.  Pt covered 315 feet with 3 minute walk test using her cane.  Pt with slow progress due to chronic nature of condition and underlying Parkinson's diagnosis.  Pt  Pt tolerated increased weights with biceps curls today. Patient will benefit from skilled PT to address the below impairments and improve overall function.    OBJECTIVE IMPAIRMENTS difficulty walking, decreased strength, increased fascial restrictions, and pain.     ACTIVITY LIMITATIONS cleaning, meal prep, and shopping.    PERSONAL FACTORS Age, Past/current experiences, Time since onset of injury/illness/exacerbation, and 1 comorbidity: Parkinson's, fibromyalgia, osteoporosis   are also affecting patient's functional outcome.      REHAB POTENTIAL: Good   CLINICAL DECISION MAKING: Stable/uncomplicated   EVALUATION COMPLEXITY: Low     GOALS: Goals reviewed with patient? Yes   SHORT TERM GOALS: Target date: 11/24/2021   The patient will demonstrate knowledge of basic self care strategies and exercises to promote healing and pain relief Baseline: Goal status: MET   2.  The patient will report a 30% improvement in pain levels with functional activities which are currently difficult including standing and walking for shopping Baseline: 65% (01/23/22) Goal status: MET   3.  The patient will be able to walk in the grocery store 15 minutes with the shopping cart without major exacerbation of pain Baseline:  30 minutes (12/21/21) Goal status: MET       LONG TERM GOALS: Target date: 04/05/22   The patient will be independent in a safe self progression of a home exercise program to promote further recovery of function   Baseline:  Goal status: In progress    2.  The patient will report a 75% improvement in pain levels with functional activities which are currently difficult including standing and  walking Baseline: 60-65% (01/23/22) Goal status: in progress    3.  The patient will be able to stand for light meal prep 8-10 min without exacerbation of pain Baseline: 25-30 min (02/08/22) Goal status: MET   4.   FOTO score improved from 45% to 52% indicating improved function with less pain Baseline: 56 (01/23/22) Goal status: MET    5.  Walk > or = to 30-45 minutes in the store without significant limitation Baseline: 30 min max and has increased pain and fatigue after  Goal Status: NEW  6. Walk > or = to 410 feet in 3 minutes to improve community distance Baseline: 315 feet Goal Status: NEW  7.  Perform sit to stand in < or = to 12 seconds to reduce falls risk. Baseline: 16 seconds  Goal Status: NEW     PLAN: PT FREQUENCY: 2x/week   PT DURATION: 8 weeks   PLANNED INTERVENTIONS: Therapeutic exercises, Therapeutic activity, Neuromuscular re-education, Balance training, Gait training, Patient/Family education, Joint mobilization, Aquatic Therapy, Dry Needling, Electrical stimulation, Moist heat, Taping, Ultrasound, Ionotophoresis 61m/ml Dexamethasone, and Manual therapy.   PLAN FOR NEXT SESSION:   2x/wk for aquatics and land based exercise for endurance, flexibility and pain management KSigurd Sos PT 02/08/22 5:05 PM   [address] SLake Buena VistaGDover [place] [address]Phone # [phone] [phone]

## 2022-02-13 ENCOUNTER — Ambulatory Visit: Payer: Medicare Other

## 2022-02-13 DIAGNOSIS — M5459 Other low back pain: Secondary | ICD-10-CM

## 2022-02-13 DIAGNOSIS — M6281 Muscle weakness (generalized): Secondary | ICD-10-CM

## 2022-02-13 DIAGNOSIS — M25552 Pain in left hip: Secondary | ICD-10-CM

## 2022-02-13 DIAGNOSIS — R0781 Pleurodynia: Secondary | ICD-10-CM | POA: Diagnosis not present

## 2022-02-13 NOTE — Therapy (Signed)
OUTPATIENT PHYSICAL THERAPY TREATMENT NOTE   Patient Name: Tracy Malone MRN: [id number] DOB:[birth year], 88 y.o., female Today's Date: 02/13/2022  PCP: [name], MD REFERRING PROVIDER: Glennon Mac, DO  Progress Note Reporting Period 12/21/21 to 01/23/22  See note below for Objective Data and Assessment of Progress/Goals.       END OF SESSION:   PT End of Session - 02/13/22 1442     Visit Number 21    Date for PT Re-Evaluation 04/05/22    Authorization Type Medicare- KX at 15    Progress Note Due on Visit 29    PT Start Time 1401    PT Stop Time 1442    PT Time Calculation (min) 41 min    Activity Tolerance Patient tolerated treatment well    Behavior During Therapy [facility] for tasks assessed/performed                                Past Medical History:  Diagnosis Date   Bell's palsy 1966   Hx: right side facial droop, resolved per patient 04/02/19   Carotid artery disease ([place]) 2010   on vascular screening;unchanged 2013.(could not tolerate simvastatin, no other statins tried)--<30% blockage bilat 07/2011   Chronic abdominal pain    Chronic fatigue and malaise    Claustrophobia    Cyst of left ovary    last imaging 06/2021, benign, no further f/u   Depression    treated in the past for years;stopped in 2010 for a years   Duodenal ulcer 1962   h/o   Dysrhythmia    ocassional PVC's, no current problems per patient on 04/02/19   Fibromyalgia    Frequent PVCs 07/2012   Seen by [facility]: benign, asymptomatic, normal EF   Gallstones 02/2021   on [place]   GERD (gastroesophageal reflux disease)    diet controlled   Glaucoma, narrow-angle    s/p laser surgery   History of hiatal hernia    during endoscopy   Hypothyroid 03/2007   IBS (irritable bowel syndrome)    Dr. Benson [place]   Ischemic colitis ([place]) 11/21/2018   no current problems per patient on 04/01/36   Lichenoid keratosis 16/96/7893   Dr.Stinehelfer   Ocular migraine    Osteoporosis  04/2010   Dr.Hawkes; later consulted Dr. Buddy Duty (2022)   Panic attack    Parkinson disease ([place])    Parkinson's disease ([place]) 06/23/2016   Recurrent UTI    has cystocele-Dr.Grewal   Shingles 1999   h/o   Superficial thrombophlebitis 03/2009   RLE   Trochanteric bursitis 12/2008   bilateral   Past Surgical History:  Procedure Laterality Date   ABDOMINAL HYSTERECTOMY     BACK SURGERY  05/2019   CATARACT EXTRACTION, BILATERAL  1995, 1996   EYE SURGERY Bilateral    laser - glaucoma   Flexible sigmoidoscopy     KYPHOPLASTY N/A 04/03/2019   Procedure: KYPHOPLASTY L1;  Surgeon: Melina Schools, MD;  Location: [place];  Service: Orthopedics;  Laterality: N/A;  90 mins   THYROIDECTOMY, PARTIAL  09/2005   L nodule; Dr. Harlow Asa   TONSILLECTOMY  1946   UPPER GI ENDOSCOPY  06/27/2012   VAGINAL HYSTERECTOMY  1971   and bladder repair.  Still has ovaries   WISDOM TOOTH EXTRACTION     Patient Active Problem List   Diagnosis Date Noted   Chronic left-sided low back pain with left-sided sciatica 01/26/2022  Senile purpura ([place]) 11/04/2021   Calculus of gallbladder without cholecystitis without obstruction 11/01/2021   Rash 03/18/2021   Herpes zoster without complication 08/65/7846   Multiple allergies 03/18/2021   Frail elderly 03/18/2021   LUQ pain 10/29/2019   Poor appetite 10/29/2019   Loss of weight 10/29/2019   Nausea and vomiting 10/29/2019   Altered bowel habits 10/29/2019   Lumbar compression fracture ([place]) 04/03/2019   Compression fracture of L1 lumbar vertebra ([place]) 03/16/2019   Osteoporosis of multiple sites 03/16/2019   Essential tremor 02/06/2019   Ischemic colitis ([place])    Leukocytosis    Acute colitis 11/22/2018   Colitis 11/22/2018   Acute lower UTI 11/22/2018   Acute cystitis without hematuria    Parkinson's disease ([place]) 10/24/2018   Gait disturbance 11/28/2017   Chronic lymphocytic thyroiditis 04/24/2017   Status post removal of thyroid nodule 04/24/2017    Depression with anxiety 03/16/2017   Subcutaneous nodules 03/16/2017   Aortic atherosclerosis ([place]) 11/24/2016   Allergy to multiple antibiotics 10/07/2016   Seafood allergy, anaphylaxis, subsequent encounter 96/29/5284   Helicobacter pylori gastritis 10/07/2016   Fall 09/05/2016   Bloating 08/15/2016   Severe recurrent major depression without psychotic features ([place]) 04/13/2016    Class: Chronic   Rash and nonspecific skin eruption 03/01/2016   Leg pain, bilateral 03/01/2016   Functional dyspepsia 10/13/2015   Subacromial bursitis 03/02/2015   Resting tremor 02/01/2015   Epigastric fullness 10/12/2014   Early satiety 10/12/2014   Cystocele 12/30/2013   IBS (irritable bowel syndrome) 09/30/2013   GERD (gastroesophageal reflux disease) 06/27/2013   PVC's (premature ventricular contractions) 10/02/2012   Depressive disorder, not elsewhere classified 10/02/2012   Bradycardia 08/01/2012   Fatigue 08/01/2012   Anxiety state 05/13/2012   Osteopenia 05/13/2012   Hypothyroidism 05/13/2012    REFERRING DIAG:  M54.42,G89.29 (ICD-10-CM) - Chronic left-sided low back pain with left-sided sciatica  M51.36 (ICD-10-CM) - DDD (degenerative disc disease), lumbar  M16.12 (ICD-10-CM) - Primary osteoarthritis of left hip    THERAPY DIAG:  Other low back pain  Muscle weakness (generalized)  Pain in left hip  PERTINENT HISTORY: Parkinson's disease; L1 compression fracture 2020; osteoporosis; had nerve blocks in past and fell on left side; Osteoporosis;  fibromyalgia;  married 10 years; negative experience with previous PT  PRECAUTIONS: osteoporosis   SUBJECTIVE:I feel 60% better since the start of care.  I had a lot of pain over the weekend and it was just terrible.  A little better now but still having pain  PAIN:  Are you having pain Yes  NPRS scale:  8/10 Pain location: Lt lumbar and gluteals  Pain orientation: Bilateral  PAIN TYPE: aching Pain description: constant,  sore Aggravating factors: when I sleep, using my cane, activity Relieving factors: laying down, ice   OBJECTIVE: from evaluation on 10/27/21   DIAGNOSTIC FINDINGS:  Not recently   PATIENT SURVEYS:  FOTO 45%  12/21/21:  40 (pt is in a flare-up)     01/23/22: 56              SENSATION: WFL to light touch    POSTURE:  Decreased lumbar lordosis, increased thoracic kyphosis; right shoulder elevated in sitting and standing with slight right shift;  hand tremor   PALPATION: Tender points in bil gluteals and lumbar paraspinals;  decreased thoraco lumbar fascial mobility   LUMBAR ROM:  Uses partial squat to pick up items from floor;  unable to tolerate supine lying but OK with right sidelying  Active  A/PROM  10/27/21  Flexion Not tested secondary to osteoporosis  Extension 0  Right lateral flexion    Left lateral flexion    Right rotation    Left rotation     (Blank rows = not tested) Trunk strength: decreased trunk extensor strength 4/5 and decreased activation of lower abdominals  LE ROM:  WFLs, right and left knee pain with full extension LE MMT:  Able to rise from standard chair without UE assist    LUMBAR SPECIAL TESTS:  Slump test: Negative   GAIT: Distance walked: 300 feet Assistive device utilized: Single point cane Level of assistance: Complete Independence Comments: shorter step length   02/08/22:  Functional tests:  5x sit to stand: 16 seconds  3 minute walk test: 315 feet using cane   TODAY'S TREATMENT  02/13/22:   Mini tramp: 3 way weight shifting x 1 min each Biceps curls 3# 2x10 bil each Seated marching: marching 2# added x20  Long arc quads: 2# added 2x10 Shoulder flexion 1# 2x10 Seated hip abduction with blue band 2x10 NuStep: level  2x 10  minutes-PT present to discuss progress Seated hamstring stretch 2x20 seconds  02/08/22:   Mini tramp: 3 way weight shifting x 1 min each Biceps curls 3# 2x10 bil each Shoulder flexion 1# 2x10 Seated hip  abduction with blue band 2x10 NuStep: level 1 x 10  minutes-PT present to discuss progress Seated hamstring stretch 2x20 seconds  01/23/22:  Seated marching: 2x10- added 2# Mini tramp: 3 way weight shifting x 1 min each Long arc quads: 2x10- 5" hold added 2#  Biceps curls 2# 2x10 bil each Shoulder flexion 1# 2x10 Seated hip abduction with blue band 2x10 NuStep: level 1 x 8  minutes-PT present to discuss progress Seated hamstring stretch 2x20 seconds      01/20/22: Pt arrives for aquatic physical therapy. Treatment took place in 3.5-5.5 feet of water. Water temperature was 91 degrees F.. Pt entered the pool via stairs with moderate use of rails stepping sideways. Pt requires buoyancy of water for support and to offload joints with strengthening exercises.   Seated water bench with 75% submersion Pt performed seated LE AROM exercises 20x in all planes, TC to help pt stay seated. Pain assessment concurrent. 50% depth forward water walking with large noodle 8x forward., 6 length backwards and 6x sidestepping today.  Hip 3 ways 10x Bil with BilUE on wall for balance Heel raises 10x, steps ups on water bench 10x BilLE Seated decompression with square noodle, PTA holding LE down, intermittent LE movements: scissors 10x in each direction and a little flutter kicking about 2 min: repeat that series 3x . PTA with lateral sway to strtech lateral trunk.  PATIENT EDUCATION:  Education details: [id number], aquatic info (11/22/21) Person educated: Patient Education method: Explanation Education comprehension: verbalized understanding     HOME EXERCISE PROGRAM: Access Code: [email or web address] URL: [email or web address] Date: 11/09/2021 Prepared by: Claiborne Billings  Exercises - Seated Hamstring Stretch  - 3 x daily - 7 x weekly - 1 sets - 3 reps - 20 hold - Seated Figure 4 Piriformis Stretch  - 1 x daily - 7 x weekly - 1 sets - 3 reps - 20 hold - Seated Hip Adduction Isometrics with Ball  - 2 x daily - 7 x  weekly - 1 sets - 10 reps - 5 hold - Seated Hip Abduction with Resistance  - 2 x daily - 7 x weekly - 1 sets - 10 reps   ASSESSMENT:  CLINICAL IMPRESSION: Pt arrived frustrated due to high pain levels over the weekend and emotional about her husband's health.  Pt tolerated gentle mobility in the clinic today and was monitored for pain and technique.  PT encouraged pt to take rest breaks during daily tasks to improve tolerance for cooking and self-care.  Pt with slow progress due to chronic nature of condition and underlying Parkinson's diagnosis.  Pt  Pt tolerated increased weights with biceps curls today. Patient will benefit from skilled PT to address the below impairments and improve overall function.    OBJECTIVE IMPAIRMENTS difficulty walking, decreased strength, increased fascial restrictions, and pain.     ACTIVITY LIMITATIONS cleaning, meal prep, and shopping.    PERSONAL FACTORS Age, Past/current experiences, Time since onset of injury/illness/exacerbation, and 1 comorbidity: Parkinson's, fibromyalgia, osteoporosis   are also affecting patient's functional outcome.      REHAB POTENTIAL: Good   CLINICAL DECISION MAKING: Stable/uncomplicated   EVALUATION COMPLEXITY: Low     GOALS: Goals reviewed with patient? Yes   SHORT TERM GOALS: Target date: 11/24/2021   The patient will demonstrate knowledge of basic self care strategies and exercises to promote healing and pain relief Baseline: Goal status: MET   2.  The patient will report a 30% improvement in pain levels with functional activities which are currently difficult including standing and walking for shopping Baseline: 65% (01/23/22) Goal status: MET   3.  The patient will be able to walk in the grocery store 15 minutes with the shopping cart without major exacerbation of pain Baseline:  30 minutes (12/21/21) Goal status: MET       LONG TERM GOALS: Target date: 04/05/22   The patient will be independent in a safe self  progression of a home exercise program to promote further recovery of function   Baseline:  Goal status: In progress    2.  The patient will report a 75% improvement in pain levels with functional activities which are currently difficult including standing and walking Baseline: 60-65% (01/23/22) Goal status: in progress    3.  The patient will be able to stand for light meal prep 8-10 min without exacerbation of pain Baseline: 25-30 min (02/08/22) Goal status: MET   4.   FOTO score improved from 45% to 52% indicating improved function with less pain Baseline: 56 (01/23/22) Goal status: MET    5.  Walk > or = to 30-45 minutes in the store without significant limitation Baseline: 30 min max and has increased pain and fatigue after  Goal Status: NEW  6. Walk > or = to 410 feet in 3 minutes to improve community distance Baseline: 315 feet Goal Status: NEW  7.  Perform sit to stand in < or = to 12 seconds to reduce falls risk. Baseline: 16 seconds  Goal Status: NEW     PLAN: PT FREQUENCY: 2x/week   PT DURATION: 8 weeks   PLANNED INTERVENTIONS: Therapeutic exercises, Therapeutic activity, Neuromuscular re-education, Balance training, Gait training, Patient/Family education, Joint mobilization, Aquatic Therapy, Dry Needling, Electrical stimulation, Moist heat, Taping, Ultrasound, Ionotophoresis 19m/ml Dexamethasone, and Manual therapy.   PLAN FOR NEXT SESSION:   2x/wk for aquatics and land based exercise for endurance, flexibility and pain management KSigurd Sos PT 02/13/22 2:42 PM   [facility] [address] [place] [address]Phone # [phone] [phone]

## 2022-02-14 ENCOUNTER — Ambulatory Visit (INDEPENDENT_AMBULATORY_CARE_PROVIDER_SITE_OTHER): Payer: Medicare Other | Admitting: Psychiatry

## 2022-02-14 DIAGNOSIS — F411 Generalized anxiety disorder: Secondary | ICD-10-CM | POA: Diagnosis not present

## 2022-02-14 NOTE — Progress Notes (Signed)
[facility] Counselor/Therapist Progress Note  Patient ID: Tracy Malone, MRN: [id number],    Date: 02/14/2022  Time Spent: 50 minutes   Treatment Type: Individual Therapy  Reported Symptoms: anxiety, depression  Mental Status Exam:  Appearance:   Well Groomed     Behavior:  Appropriate, Sharing, and Motivated  Motor:  Uses cane to walk  Speech/Language:   Clear and Coherent  Affect:  Anxious, some depression  Mood:  anxious and depressed  Thought process:  normal  Thought content:    Some rumination  Sensory/Perceptual disturbances:    WNL  Orientation:  oriented to person, place, time/date, situation, day of week, month of year, year, and stated date of February 14, 2022  Attention:  Good  Concentration:  Good  Memory:  WNL  Fund of knowledge:   Good  Insight:    Good and Fair  Judgment:   Good  Impulse Control:  Good   Risk Assessment: Danger to Self:  No Self-injurious Behavior: No Danger to Others: No Duty to Warn:no Physical Aggression / Violence:No  Access to Firearms a concern: No  Gang Involvement:No   Subjective: Patient in today reporting anxiety and depression. Looking forward to son coming to visit when he gets out half-way house in October. Mood not as low today. Anxiety she reports is her main symptom and can fluctuate in severity due to how her day goes and some of her physical symptoms worsening. No tearfulness today and does seem to be having a better day emotionally.  Hoping to go back to her church soon which I think would help her emotionally as well. Reports being able to see more positives and trying to "keep this up".  Husband also having some pulmonary issues but remains very supportive of patient as they have been together over 55 years in marriage.  Interventions: Cognitive Behavioral Therapy  Long term goal: Develop the ability to recognize, accept, and cope with feelings of anxiety and depression. Short term goal: Verbalize any  unresolved grief issues that may be contributing to anxiety and depression. Strategy: Replace negative self-defeating self talk with verbalization of realistic and positive cognitive messages to lessen depression/anxiety and improve mood.  Diagnosis:   ICD-10-CM   1. Generalized anxiety disorder  F41.1      Plan: Patient in today showing real good motivation and active participation.  She does still report anxiety and depression however today "is having a better day".  States that her days can vary due to level of pain, things that can happen or go wrong.  Also shares that she is trying to apply more of the strategies that we have talked about in sessions especially the way she talks to herself and trying to keep more positive thoughts and self-talk on a daily basis, and ways she can better manage when the days are more challenging.  Looking forward to her son coming to visit in October when he gets out of the halfway house out of state.  Continues her PT and feels that that is helping her pain level "on some days".  Encouraged her to continue her goal directed behaviors and her intentional use of positive thoughts and being in touch with neighbors a little more and to follow through on her desire to attend church soon.  Has good support in her neighborhood and also their family as daughter lives close by. Encouraged patient in her practice of more positive behaviors as discussed in session including: Listening to music  which she finds very emotionally calming, consider doing more activities that she enjoys and is able to do such as reading and talking with friends on the phone, positive self-care and self-talk, getting books when possible from the nearby [employer] which she enjoys, not overly focusing on the negatives, allow for good sleep patterns including not oversleeping during the day with naps, remaining in the present focusing on what she can control or change, staying in contact with family  members and friends who are supportive, and realize the strength she shows working with goal directed behaviors to move in a direction that supports her improved emotional health.  Goal review and progress/challenges noted with patient.  Next appointment within 2 to 3 weeks.  This record has been created using [employer].  Chart creation errors have been sought, but may not always have been located and corrected.  Such creation errors do not reflect on the standard of medical care provided.   Shanon Ace, LCSW

## 2022-02-24 ENCOUNTER — Ambulatory Visit: Payer: Medicare Other | Admitting: Physical Therapy

## 2022-02-24 DIAGNOSIS — H01004 Unspecified blepharitis left upper eyelid: Secondary | ICD-10-CM | POA: Diagnosis not present

## 2022-02-24 DIAGNOSIS — H01001 Unspecified blepharitis right upper eyelid: Secondary | ICD-10-CM | POA: Diagnosis not present

## 2022-02-27 ENCOUNTER — Ambulatory Visit: Payer: Medicare Other | Attending: Sports Medicine | Admitting: Physical Therapy

## 2022-02-27 ENCOUNTER — Encounter: Payer: Self-pay | Admitting: Physical Therapy

## 2022-02-27 DIAGNOSIS — M25552 Pain in left hip: Secondary | ICD-10-CM

## 2022-02-27 DIAGNOSIS — M5459 Other low back pain: Secondary | ICD-10-CM

## 2022-02-27 DIAGNOSIS — M6281 Muscle weakness (generalized): Secondary | ICD-10-CM | POA: Diagnosis not present

## 2022-02-27 NOTE — Therapy (Signed)
OUTPATIENT PHYSICAL THERAPY TREATMENT NOTE   Patient Name: Tracy Malone MRN: [id number] DOB:02-Jul-1932, 88 y.o., female Today's Date: 02/27/2022  PCP: [name], MD REFERRING PROVIDER: Glennon Mac, DO    See note below for Objective Data and Assessment of Progress/Goals.       END OF SESSION:   PT End of Session - 02/27/22 1233     Visit Number 22    Date for PT Re-Evaluation 04/05/22    Authorization Type Medicare- KX at 15    Progress Note Due on Visit 29    PT Start Time 1231    PT Stop Time 1309    PT Time Calculation (min) 38 min    Activity Tolerance Patient tolerated treatment well    Behavior During Therapy [facility] for tasks assessed/performed                                 Past Medical History:  Diagnosis Date   Bell's palsy 1966   Hx: right side facial droop, resolved per patient 04/02/19   Carotid artery disease ([place]) 2010   on vascular screening;unchanged 2013.(could not tolerate simvastatin, no other statins tried)--<30% blockage bilat 07/2011   Chronic abdominal pain    Chronic fatigue and malaise    Claustrophobia    Cyst of left ovary    last imaging 06/2021, benign, no further f/u   Depression    treated in the past for years;stopped in 2010 for a years   Duodenal ulcer 1962   h/o   Dysrhythmia    ocassional PVC's, no current problems per patient on 04/02/19   Fibromyalgia    Frequent PVCs 07/2012   Seen by [facility]: benign, asymptomatic, normal EF   Gallstones 02/2021   on [place]   GERD (gastroesophageal reflux disease)    diet controlled   Glaucoma, narrow-angle    s/p laser surgery   History of hiatal hernia    during endoscopy   Hypothyroid 03/2007   IBS (irritable bowel syndrome)    Dr. Benson [place]   Ischemic colitis ([place]) 11/21/2018   no current problems per patient on 08/30/04   Lichenoid keratosis 23/76/2831   Dr.Stinehelfer   Ocular migraine    Osteoporosis 04/2010   Dr.Hawkes; later consulted Dr. Buddy Duty  (2022)   Panic attack    Parkinson disease ([place])    Parkinson's disease ([place]) 06/23/2016   Recurrent UTI    has cystocele-Dr.Grewal   Shingles 1999   h/o   Superficial thrombophlebitis 03/2009   RLE   Trochanteric bursitis 12/2008   bilateral   Past Surgical History:  Procedure Laterality Date   ABDOMINAL HYSTERECTOMY     BACK SURGERY  05/2019   CATARACT EXTRACTION, BILATERAL  1995, 1996   EYE SURGERY Bilateral    laser - glaucoma   Flexible sigmoidoscopy     KYPHOPLASTY N/A 04/03/2019   Procedure: KYPHOPLASTY L1;  Surgeon: Melina Schools, MD;  Location: [place];  Service: Orthopedics;  Laterality: N/A;  90 mins   THYROIDECTOMY, PARTIAL  09/2005   L nodule; Dr. Harlow Asa   TONSILLECTOMY  1946   UPPER GI ENDOSCOPY  06/27/2012   VAGINAL HYSTERECTOMY  1971   and bladder repair.  Still has ovaries   WISDOM TOOTH EXTRACTION     Patient Active Problem List   Diagnosis Date Noted   Chronic left-sided low back pain with left-sided sciatica 01/26/2022   Senile purpura ([place]) 11/04/2021  Calculus of gallbladder without cholecystitis without obstruction 11/01/2021   Rash 03/18/2021   Herpes zoster without complication 89/16/9450   Multiple allergies 03/18/2021   Frail elderly 03/18/2021   LUQ pain 10/29/2019   Poor appetite 10/29/2019   Loss of weight 10/29/2019   Nausea and vomiting 10/29/2019   Altered bowel habits 10/29/2019   Lumbar compression fracture (HCC) 04/03/2019   Compression fracture of L1 lumbar vertebra (HCC) 03/16/2019   Osteoporosis of multiple sites 03/16/2019   Essential tremor 02/06/2019   Ischemic colitis ([place])    Leukocytosis    Acute colitis 11/22/2018   Colitis 11/22/2018   Acute lower UTI 11/22/2018   Acute cystitis without hematuria    Parkinson's disease ([place]) 10/24/2018   Gait disturbance 11/28/2017   Chronic lymphocytic thyroiditis 04/24/2017   Status post removal of thyroid nodule 04/24/2017   Depression with anxiety 03/16/2017   Subcutaneous  nodules 03/16/2017   Aortic atherosclerosis ([place]) 11/24/2016   Allergy to multiple antibiotics 10/07/2016   Seafood allergy, anaphylaxis, subsequent encounter 38/88/2800   Helicobacter pylori gastritis 10/07/2016   Fall 09/05/2016   Bloating 08/15/2016   Severe recurrent major depression without psychotic features ([place]) 04/13/2016    Class: Chronic   Rash and nonspecific skin eruption 03/01/2016   Leg pain, bilateral 03/01/2016   Functional dyspepsia 10/13/2015   Subacromial bursitis 03/02/2015   Resting tremor 02/01/2015   Epigastric fullness 10/12/2014   Early satiety 10/12/2014   Cystocele 12/30/2013   IBS (irritable bowel syndrome) 09/30/2013   GERD (gastroesophageal reflux disease) 06/27/2013   PVC's (premature ventricular contractions) 10/02/2012   Depressive disorder, not elsewhere classified 10/02/2012   Bradycardia 08/01/2012   Fatigue 08/01/2012   Anxiety state 05/13/2012   Osteopenia 05/13/2012   Hypothyroidism 05/13/2012    REFERRING DIAG:  M54.42,G89.29 (ICD-10-CM) - Chronic left-sided low back pain with left-sided sciatica  M51.36 (ICD-10-CM) - DDD (degenerative disc disease), lumbar  M16.12 (ICD-10-CM) - Primary osteoarthritis of left hip    THERAPY DIAG:  Other low back pain  Muscle weakness (generalized)  Pain in left hip  PERTINENT HISTORY: Parkinson's disease; L1 compression fracture 2020; osteoporosis; had nerve blocks in past and fell on left side; Osteoporosis;  fibromyalgia;  married 42 years; negative experience with previous PT  PRECAUTIONS: osteoporosis   SUBJECTIVE: LT hip constant pain.  PAIN:  Are you having pain Yes  NPRS scale:  5/10 Pain location: Lt lumbar and gluteals  Pain orientation: Bilateral  PAIN TYPE: aching Pain description: constant, sore Aggravating factors: when I sleep, using my cane, activity Relieving factors: laying down, ice   OBJECTIVE: from evaluation on 10/27/21   DIAGNOSTIC FINDINGS:  Not recently    PATIENT SURVEYS:  FOTO 45%  12/21/21:  40 (pt is in a flare-up)     01/23/22: 56              SENSATION: WFL to light touch    POSTURE:  Decreased lumbar lordosis, increased thoracic kyphosis; right shoulder elevated in sitting and standing with slight right shift;  hand tremor   PALPATION: Tender points in bil gluteals and lumbar paraspinals;  decreased thoraco lumbar fascial mobility   LUMBAR ROM:  Uses partial squat to pick up items from floor;  unable to tolerate supine lying but OK with right sidelying    Active  A/PROM  10/27/21  Flexion Not tested secondary to osteoporosis  Extension 0  Right lateral flexion    Left lateral flexion    Right rotation    Left  rotation     (Blank rows = not tested) Trunk strength: decreased trunk extensor strength 4/5 and decreased activation of lower abdominals  LE ROM:  WFLs, right and left knee pain with full extension LE MMT:  Able to rise from standard chair without UE assist    LUMBAR SPECIAL TESTS:  Slump test: Negative   GAIT: Distance walked: 300 feet Assistive device utilized: Single point cane Level of assistance: Complete Independence Comments: shorter step length   02/08/22:  Functional tests:  5x sit to stand: 16 seconds  3 minute walk test: 315 feet using cane   TODAY'S TREATMENT     02/27/22: Mini tramp: 3 way weight shifting x 1 min each; pain with weightshifting forward and back in Lt SI Biceps curls 3# 2x10 bil each Seated marching: marching 2# added x20  Long arc quads: 2# added 2x10 Shoulder flexion 1# 2x10: C/O Lt neck pain: shoulder rolls 20x post and Rt rotation for cervical 10x Seated hip abduction with blue band 2x10: complains of LT SI pain when doing the ex but nothing increased after.  NuStep: level  2x 10  minutes-PTA present to discuss progress Seated hamstring stretch 2x20 seconds   02/13/22:   Mini tramp: 3 way weight shifting x 1 min each Biceps curls 3# 2x10 bil each Seated marching: marching  2# added x20  Long arc quads: 2# added 2x10 Shoulder flexion 1# 2x10 Seated hip abduction with blue band 2x10 NuStep: level  2x 10  minutes-PT present to discuss progress Seated hamstring stretch 2x20 seconds  02/08/22:   Mini tramp: 3 way weight shifting x 1 min each Biceps curls 3# 2x10 bil each Shoulder flexion 1# 2x10 Seated hip abduction with blue band 2x10 NuStep: level 1 x 10  minutes-PT present to discuss progress Seated hamstring stretch 2x20 seconds  01/23/22:  Seated marching: 2x10- added 2# Mini tramp: 3 way weight shifting x 1 min each Long arc quads: 2x10- 5" hold added 2#  Biceps curls 2# 2x10 bil each Shoulder flexion 1# 2x10 Seated hip abduction with blue band 2x10 NuStep: level 1 x 8  minutes-PT present to discuss progress Seated hamstring stretch 2x20 seconds      01/20/22: Pt arrives for aquatic physical therapy. Treatment took place in 3.5-5.5 feet of water. Water temperature was 91 degrees F.. Pt entered the pool via stairs with moderate use of rails stepping sideways. Pt requires buoyancy of water for support and to offload joints with strengthening exercises.   Seated water bench with 75% submersion Pt performed seated LE AROM exercises 20x in all planes, TC to help pt stay seated. Pain assessment concurrent. 50% depth forward water walking with large noodle 8x forward., 6 length backwards and 6x sidestepping today.  Hip 3 ways 10x Bil with BilUE on wall for balance Heel raises 10x, steps ups on water bench 10x BilLE Seated decompression with square noodle, PTA holding LE down, intermittent LE movements: scissors 10x in each direction and a little flutter kicking about 2 min: repeat that series 3x . PTA with lateral sway to strtech lateral trunk.  PATIENT EDUCATION:  Education details: [id number], aquatic info (11/22/21) Person educated: Patient Education method: Explanation Education comprehension: verbalized understanding     HOME EXERCISE PROGRAM: Access  Code: [email or web address] URL: [email or web address] Date: 11/09/2021 Prepared by: Claiborne Billings  Exercises - Seated Hamstring Stretch  - 3 x daily - 7 x weekly - 1 sets - 3 reps - 20 hold - Seated Figure 4 Piriformis Stretch  -  1 x daily - 7 x weekly - 1 sets - 3 reps - 20 hold - Seated Hip Adduction Isometrics with Ball  - 2 x daily - 7 x weekly - 1 sets - 10 reps - 5 hold - Seated Hip Abduction with Resistance  - 2 x daily - 7 x weekly - 1 sets - 10 reps   ASSESSMENT:   CLINICAL IMPRESSION: Pt arrives to PT with Lt hip pain > Rt hip. Pt feels like her Lt hip pain is hopeless and she is going to have to live with it. Pt is considering getting an SI injection. Pt can tolerate all her exercises well but did request 1# for her biceps today. LE was painful with clamshells while she did the movement but did not hurt more after completing the exercise.    OBJECTIVE IMPAIRMENTS difficulty walking, decreased strength, increased fascial restrictions, and pain.     ACTIVITY LIMITATIONS cleaning, meal prep, and shopping.    PERSONAL FACTORS Age, Past/current experiences, Time since onset of injury/illness/exacerbation, and 1 comorbidity: Parkinson's, fibromyalgia, osteoporosis   are also affecting patient's functional outcome.      REHAB POTENTIAL: Good   CLINICAL DECISION MAKING: Stable/uncomplicated   EVALUATION COMPLEXITY: Low     GOALS: Goals reviewed with patient? Yes   SHORT TERM GOALS: Target date: 11/24/2021   The patient will demonstrate knowledge of basic self care strategies and exercises to promote healing and pain relief Baseline: Goal status: MET   2.  The patient will report a 30% improvement in pain levels with functional activities which are currently difficult including standing and walking for shopping Baseline: 65% (01/23/22) Goal status: MET   3.  The patient will be able to walk in the grocery store 15 minutes with the shopping cart without major exacerbation of  pain Baseline:  30 minutes (12/21/21) Goal status: MET       LONG TERM GOALS: Target date: 04/05/22   The patient will be independent in a safe self progression of a home exercise program to promote further recovery of function   Baseline:  Goal status: In progress    2.  The patient will report a 75% improvement in pain levels with functional activities which are currently difficult including standing and walking Baseline: 60-65% (01/23/22) Goal status: in progress    3.  The patient will be able to stand for light meal prep 8-10 min without exacerbation of pain Baseline: 25-30 min (02/08/22) Goal status: MET   4.   FOTO score improved from 45% to 52% indicating improved function with less pain Baseline: 56 (01/23/22) Goal status: MET    5.  Walk > or = to 30-45 minutes in the store without significant limitation Baseline: 30 min max and has increased pain and fatigue after  Goal Status: NEW  6. Walk > or = to 410 feet in 3 minutes to improve community distance Baseline: 315 feet Goal Status: NEW  7.  Perform sit to stand in < or = to 12 seconds to reduce falls risk. Baseline: 16 seconds  Goal Status: NEW     PLAN: PT FREQUENCY: 2x/week   PT DURATION: 8 weeks   PLANNED INTERVENTIONS: Therapeutic exercises, Therapeutic activity, Neuromuscular re-education, Balance training, Gait training, Patient/Family education, Joint mobilization, Aquatic Therapy, Dry Needling, Electrical stimulation, Moist heat, Taping, Ultrasound, Ionotophoresis 42m/ml Dexamethasone, and Manual therapy.   PLAN FOR NEXT SESSION:   2x/wk for aquatics and land based exercise for endurance, flexibility and pain management  Myrene Galas, PTA 02/27/22 1:08 PM   [facility] [address], [address] [place], [place] [address] Phone # [phone] Fax [phone]

## 2022-02-27 NOTE — Progress Notes (Signed)
Tracy Malone Phone: [phone]   Assessment and Plan:     1. Chronic left-sided low back pain with left-sided sciatica 3. DDD (degenerative disc disease), lumbar -Chronic with exacerbation, subsequent visit - Left-sided low back pain with radicular symptoms on the left leg are patient's primary concern at today's office visit and are consistent with sciatica, most likely from lumbar origin based on physical exam, HPI, prior lumbar MRI - Patient elects to proceed with epidural to left-sided L5-S1 - Continue Tylenol for day-to-day pain relief - Continue water aerobic therapy - It was recommended by patient's physical therapist that patient may benefit from a specific geriatric physical therapist and I agree with this assessment.  We will look into finding a local geriatric physical therapist that can help patient with conditioning and ongoing musculoskeletal complaints  2. Rib pain on left side 4. Closed fracture of multiple ribs of left side, sequela  -Chronic, improving, subsequent visit - Overall patient states that left-sided rib cage pain is improving with water aerobics therapy and intermittent Tylenol use - It was recommended by patient's physical therapist that patient may benefit from a specific geriatric physical therapist and I agree with this assessment.  We will look into finding a local geriatric physical therapist that can help patient with conditioning and ongoing musculoskeletal complaints  Pertinent previous records reviewed include PT note 03/06/2022, PT note 03/01/2022, lumbar MRI 02/28/2021   Follow Up: 2 weeks after epidural to discuss benefit from procedure   Subjective:   I, Pincus Badder, am serving as a Education administrator for Doctor Glennon Mac   Chief Complaint: left side back pain   HPI:  10/13/2021 Patient is a 88 year old female complaining of left side back pain. Patient  states had surgery on back 2 years ago pcp thinks its a muscle pain got a nerve block and fell and fx her ribs a year ago, back pain has been bothering her for about 2 years from PT had a bad experience does get radiating pain down to her leg feels like a spasm cant sit for more than 30 mins laying Is awful standing isnt great either pain also radiate around to the front would like PT    12/21/2021 Patient states that she had a bad night last night but went to PT this morning Tens unit really helped , is having some sciatic pain , really enjoys aquatic therapy    01/30/2022 Patient states would like to continue with water therapy and the other , still sore from her rib pain when she fell    03/07/2022 Patient states she is doing okay, wants to talk about her neck thinks she strained it a couple of weeks ago     Relevant Historical Information: History of L1 vertebral fracture, Parkinson's with essential tremor  Additional pertinent review of systems negative.   Current Outpatient Medications:    ALPRAZolam (XANAX) 0.5 MG tablet, Take four tablets daily as needed for anxiety., Disp: 120 tablet, Rfl: 2   b complex vitamins tablet, Take 1 tablet by mouth daily., Disp: , Rfl:    Calcium Citrate-Vitamin D (CALCIUM CITRATE +D PO), Take 1 tablet by mouth in the morning and at bedtime. Taking 400 mg of calcium and 500 units of vitamin D twice daily (total 800 mg calcium and 1000 units of vitamin D), Disp: , Rfl:    Carbidopa-Levodopa ER (SINEMET CR) 25-100 MG tablet controlled release, TAKE  ONE TABLET BY MOUTH EVERY MORNING; 1 TABLET AT NOON; AND 1 TABLET AT BEDTIME (Patient taking differently: 1 tablet in the morning, at noon, in the evening, and at bedtime.), Disp: 270 tablet, Rfl: 1   dicyclomine (BENTYL) 10 MG capsule, Take 1 capsule (10 mg total) by mouth every 6 (six) hours as needed., Disp: 30 capsule, Rfl: 0   FLUoxetine (PROZAC) 10 MG capsule, Take 1 capsule (10 mg total) by mouth 2 (two) times  daily., Disp: 60 capsule, Rfl: 5   Probiotic Product (ALIGN PO), Take 1 capsule by mouth daily., Disp: , Rfl:    SYNTHROID 25 MCG tablet, TAKE 1 TABLET DAILY BEFORE BREAKFAST FOR HYPOTHYROIDISM, Disp: 90 tablet, Rfl: 0   Objective:     Vitals:   03/07/22 1252  Pulse: 70  SpO2: 97%  Weight: 95 lb (43.1 kg)  Height: '4\' 11"'$  (1.499 m)      Body mass index is 19.19 kg/m.    Physical Exam:    Gen: Appears well, nad, nontoxic and pleasant Psych: Alert and oriented, appropriate mood and affect Neuro: sensation intact, strength is 5/5 in upper and lower extremities, muscle tone wnl Skin: no susupicious lesions or rashes  Back - Normal skin, Spine with normal alignment and no deformity.   No tenderness to vertebral process palpation.   TTP left-sided lumbar paraspinal L3-L5.  Right paraspinous muscles are not tender and without spasm Straight leg raise positive on left, negative on right TTP gluteal musculature on left   Electronically signed by:  Tracy Mccreedy D.[facility] 1:13 PM 03/07/22

## 2022-02-28 ENCOUNTER — Telehealth: Payer: Self-pay

## 2022-02-28 NOTE — Telephone Encounter (Signed)
Pt advised.

## 2022-02-28 NOTE — Telephone Encounter (Signed)
Heat, stretching and massage would be the first recommended steps.  She can try 1/2 carisoprodol (which is likely her husband's) at bedtime once only if these other measures aren't helpful.

## 2022-02-28 NOTE — Telephone Encounter (Signed)
Pt called and said she has been having neck pain and stiffness for about 3 days now and wants to see if she could cut the carisoprodol in half and take that?

## 2022-03-01 ENCOUNTER — Ambulatory Visit: Payer: Medicare Other | Admitting: Sports Medicine

## 2022-03-01 ENCOUNTER — Encounter: Payer: Self-pay | Admitting: Physical Therapy

## 2022-03-01 ENCOUNTER — Ambulatory Visit: Payer: Medicare Other | Admitting: Physical Therapy

## 2022-03-01 DIAGNOSIS — M6281 Muscle weakness (generalized): Secondary | ICD-10-CM

## 2022-03-01 DIAGNOSIS — M5459 Other low back pain: Secondary | ICD-10-CM | POA: Diagnosis not present

## 2022-03-01 DIAGNOSIS — M25552 Pain in left hip: Secondary | ICD-10-CM | POA: Diagnosis not present

## 2022-03-01 NOTE — Therapy (Signed)
OUTPATIENT PHYSICAL THERAPY TREATMENT NOTE   Patient Name: Tracy Malone MRN: [id number] DOB:[birth year], 88 y.o., female Today's Date: 03/01/2022  PCP: [name], MD REFERRING PROVIDER: Glennon Mac, DO    See note below for Objective Data and Assessment of Progress/Goals.       END OF SESSION:   PT End of Session - 03/01/22 1143     Visit Number 23    Date for PT Re-Evaluation 04/05/22    Authorization Type Medicare- KX at 15    Progress Note Due on Visit 29    PT Start Time 1100    PT Stop Time 1140    PT Time Calculation (min) 40 min    Activity Tolerance Patient tolerated treatment well    Behavior During Therapy [facility] for tasks assessed/performed                                 Past Medical History:  Diagnosis Date   Bell's palsy 1966   Hx: right side facial droop, resolved per patient 04/02/19   Carotid artery disease ([place]) 2010   on vascular screening;unchanged 2013.(could not tolerate simvastatin, no other statins tried)--<30% blockage bilat 07/2011   Chronic abdominal pain    Chronic fatigue and malaise    Claustrophobia    Cyst of left ovary    last imaging 06/2021, benign, no further f/u   Depression    treated in the past for years;stopped in 2010 for a years   Duodenal ulcer 1962   h/o   Dysrhythmia    ocassional PVC's, no current problems per patient on 04/02/19   Fibromyalgia    Frequent PVCs 07/2012   Seen by [facility]: benign, asymptomatic, normal EF   Gallstones 02/2021   on [place]   GERD (gastroesophageal reflux disease)    diet controlled   Glaucoma, narrow-angle    s/p laser surgery   History of hiatal hernia    during endoscopy   Hypothyroid 03/2007   IBS (irritable bowel syndrome)    Dr. Benson [place]   Ischemic colitis ([place]) 11/21/2018   no current problems per patient on 03/01/31   Lichenoid keratosis 54/98/2641   Dr.Stinehelfer   Ocular migraine    Osteoporosis 04/2010   Dr.Hawkes; later consulted Dr. Buddy Duty  (2022)   Panic attack    Parkinson disease ([place])    Parkinson's disease ([place]) 06/23/2016   Recurrent UTI    has cystocele-Dr.Grewal   Shingles 1999   h/o   Superficial thrombophlebitis 03/2009   RLE   Trochanteric bursitis 12/2008   bilateral   Past Surgical History:  Procedure Laterality Date   ABDOMINAL HYSTERECTOMY     BACK SURGERY  05/2019   CATARACT EXTRACTION, BILATERAL  1995, 1996   EYE SURGERY Bilateral    laser - glaucoma   Flexible sigmoidoscopy     KYPHOPLASTY N/A 04/03/2019   Procedure: KYPHOPLASTY L1;  Surgeon: Melina Schools, MD;  Location: [facility];  Service: Orthopedics;  Laterality: N/A;  90 mins   THYROIDECTOMY, PARTIAL  09/2005   L nodule; Dr. Harlow Asa   TONSILLECTOMY  1946   UPPER GI ENDOSCOPY  06/27/2012   VAGINAL HYSTERECTOMY  1971   and bladder repair.  Still has ovaries   WISDOM TOOTH EXTRACTION     Patient Active Problem List   Diagnosis Date Noted   Chronic left-sided low back pain with left-sided sciatica 01/26/2022   Senile purpura ([place]) 11/04/2021  Calculus of gallbladder without cholecystitis without obstruction 11/01/2021   Rash 03/18/2021   Herpes zoster without complication 83/29/1916   Multiple allergies 03/18/2021   Frail elderly 03/18/2021   LUQ pain 10/29/2019   Poor appetite 10/29/2019   Loss of weight 10/29/2019   Nausea and vomiting 10/29/2019   Altered bowel habits 10/29/2019   Lumbar compression fracture (HCC) 04/03/2019   Compression fracture of L1 lumbar vertebra (HCC) 03/16/2019   Osteoporosis of multiple sites 03/16/2019   Essential tremor 02/06/2019   Ischemic colitis ([place])    Leukocytosis    Acute colitis 11/22/2018   Colitis 11/22/2018   Acute lower UTI 11/22/2018   Acute cystitis without hematuria    Parkinson's disease ([place]) 10/24/2018   Gait disturbance 11/28/2017   Chronic lymphocytic thyroiditis 04/24/2017   Status post removal of thyroid nodule 04/24/2017   Depression with anxiety 03/16/2017   Subcutaneous  nodules 03/16/2017   Aortic atherosclerosis ([place]) 11/24/2016   Allergy to multiple antibiotics 10/07/2016   Seafood allergy, anaphylaxis, subsequent encounter 60/60/0459   Helicobacter pylori gastritis 10/07/2016   Fall 09/05/2016   Bloating 08/15/2016   Severe recurrent major depression without psychotic features ([place]) 04/13/2016    Class: Chronic   Rash and nonspecific skin eruption 03/01/2016   Leg pain, bilateral 03/01/2016   Functional dyspepsia 10/13/2015   Subacromial bursitis 03/02/2015   Resting tremor 02/01/2015   Epigastric fullness 10/12/2014   Early satiety 10/12/2014   Cystocele 12/30/2013   IBS (irritable bowel syndrome) 09/30/2013   GERD (gastroesophageal reflux disease) 06/27/2013   PVC's (premature ventricular contractions) 10/02/2012   Depressive disorder, not elsewhere classified 10/02/2012   Bradycardia 08/01/2012   Fatigue 08/01/2012   Anxiety state 05/13/2012   Osteopenia 05/13/2012   Hypothyroidism 05/13/2012    REFERRING DIAG:  M54.42,G89.29 (ICD-10-CM) - Chronic left-sided low back pain with left-sided sciatica  M51.36 (ICD-10-CM) - DDD (degenerative disc disease), lumbar  M16.12 (ICD-10-CM) - Primary osteoarthritis of left hip    THERAPY DIAG:  Other low back pain  Muscle weakness (generalized)  Pain in left hip  PERTINENT HISTORY: Parkinson's disease; L1 compression fracture 2020; osteoporosis; had nerve blocks in past and fell on left side; Osteoporosis;  fibromyalgia;  married 62 years; negative experience with previous PT  PRECAUTIONS: osteoporosis   SUBJECTIVE: I woke up with terrible left neck pain that goes up into my head and down to my shoulder. Painful to turn LT. PAIN:  Are you having pain Yes  NPRS scale:  5/10 Lt SI, 8/10 Left neck especially when turning LT. Pain location: Lt lumbar and gluteals  Pain orientation: Bilateral  PAIN TYPE: aching Pain description: constant, sore Aggravating factors: when I sleep, using my  cane, activity Relieving factors: laying down, ice   OBJECTIVE: from evaluation on 10/27/21   DIAGNOSTIC FINDINGS:  Not recently   PATIENT SURVEYS:  FOTO 45%  12/21/21:  40 (pt is in a flare-up)     01/23/22: 56              SENSATION: WFL to light touch    POSTURE:  Decreased lumbar lordosis, increased thoracic kyphosis; right shoulder elevated in sitting and standing with slight right shift;  hand tremor   PALPATION: Tender points in bil gluteals and lumbar paraspinals;  decreased thoraco lumbar fascial mobility   LUMBAR ROM:  Uses partial squat to pick up items from floor;  unable to tolerate supine lying but OK with right sidelying    Active  A/PROM  10/27/21  Flexion  Not tested secondary to osteoporosis  Extension 0  Right lateral flexion    Left lateral flexion    Right rotation    Left rotation     (Blank rows = not tested) Trunk strength: decreased trunk extensor strength 4/5 and decreased activation of lower abdominals  LE ROM:  WFLs, right and left knee pain with full extension LE MMT:  Able to rise from standard chair without UE assist    LUMBAR SPECIAL TESTS:  Slump test: Negative   GAIT: Distance walked: 300 feet Assistive device utilized: Single point cane Level of assistance: Complete Independence Comments: shorter step length   02/08/22:  Functional tests:  5x sit to stand: 16 seconds  3 minute walk test: 315 feet using cane   TODAY'S TREATMENT       03/01/22: Pt arrives for aquatic physical therapy. Treatment took place in 3.5-5.5 feet of water. Water temperature was 91 degrees F. Pt entered the pool via stairs step to step with heavy use of the hand rails. Pt requires buoyancy of water for support and to offload joints with strengthening exercises and pain reduction.  Seated water bench with 75% submersion Pt performed seated LE AROM exercises 20x in all planes, cervical rotation RT 5x slowly: very painful. This improved with 2 more sets . Seated at edge  of bench with feet on the floor: shoulder flex/ext gentle 2x10, gentle breast stroke arms 1 min, then horizontal add/abd 2x10.  Water walking forward and backwards 6x with blue noodle for support: slow, small steps with close SBA. Seated decompression with large noodle behind patient. PTA providing assistance to help hold noodle down so it didn't increase shoulder pain.  Intermittent LE AROM concurrent with 3x5 of cervical rotation to the RT.     02/27/22: Mini tramp: 3 way weight shifting x 1 min each; pain with weightshifting forward and back in Lt SI Biceps curls 3# 2x10 bil each Seated marching: marching 2# added x20  Long arc quads: 2# added 2x10 Shoulder flexion 1# 2x10: C/O Lt neck pain: shoulder rolls 20x post and Rt rotation for cervical 10x Seated hip abduction with blue band 2x10: complains of LT SI pain when doing the ex but nothing increased after.  NuStep: level  2x 10  minutes-PTA present to discuss progress Seated hamstring stretch 2x20 seconds   02/13/22:   Mini tramp: 3 way weight shifting x 1 min each Biceps curls 3# 2x10 bil each Seated marching: marching 2# added x20  Long arc quads: 2# added 2x10 Shoulder flexion 1# 2x10 Seated hip abduction with blue band 2x10 NuStep: level  2x 10  minutes-PT present to discuss progress Seated hamstring stretch 2x20 seconds  02/08/22:   Mini tramp: 3 way weight shifting x 1 min each Biceps curls 3# 2x10 bil each Shoulder flexion 1# 2x10 Seated hip abduction with blue band 2x10 NuStep: level 1 x 10  minutes-PT present to discuss progress Seated hamstring stretch 2x20 seconds  01/23/22:  Seated marching: 2x10- added 2# Mini tramp: 3 way weight shifting x 1 min each Long arc quads: 2x10- 5" hold added 2#  Biceps curls 2# 2x10 bil each Shoulder flexion 1# 2x10 Seated hip abduction with blue band 2x10 NuStep: level 1 x 8  minutes-PT present to discuss progress Seated hamstring stretch 2x20 seconds      01/20/22: Pt arrives for  aquatic physical therapy. Treatment took place in 3.5-5.5 feet of water. Water temperature was 91 degrees F.. Pt entered the pool via stairs  with moderate use of rails stepping sideways. Pt requires buoyancy of water for support and to offload joints with strengthening exercises.   Seated water bench with 75% submersion Pt performed seated LE AROM exercises 20x in all planes, TC to help pt stay seated. Pain assessment concurrent. 50% depth forward water walking with large noodle 8x forward., 6 length backwards and 6x sidestepping today.  Hip 3 ways 10x Bil with BilUE on wall for balance Heel raises 10x, steps ups on water bench 10x BilLE Seated decompression with square noodle, PTA holding LE down, intermittent LE movements: scissors 10x in each direction and a little flutter kicking about 2 min: repeat that series 3x . PTA with lateral sway to strtech lateral trunk.  PATIENT EDUCATION:  Education details: [id number], aquatic info (11/22/21) Person educated: Patient Education method: Explanation Education comprehension: verbalized understanding     HOME EXERCISE PROGRAM: Access Code: [email or web address] URL: [email or web address] Date: 11/09/2021 Prepared by: Claiborne Billings  Exercises - Seated Hamstring Stretch  - 3 x daily - 7 x weekly - 1 sets - 3 reps - 20 hold - Seated Figure 4 Piriformis Stretch  - 1 x daily - 7 x weekly - 1 sets - 3 reps - 20 hold - Seated Hip Adduction Isometrics with Ball  - 2 x daily - 7 x weekly - 1 sets - 10 reps - 5 hold - Seated Hip Abduction with Resistance  - 2 x daily - 7 x weekly - 1 sets - 10 reps   ASSESSMENT:   CLINICAL IMPRESSION: Pt arrives to aquatic Pt with severe Lt cervical pain. This improved throughout the session with gentle stretching. This limited her overall work load in addition to her Lt SI pain.   OBJECTIVE IMPAIRMENTS difficulty walking, decreased strength, increased fascial restrictions, and pain.     ACTIVITY LIMITATIONS cleaning, meal  prep, and shopping.    PERSONAL FACTORS Age, Past/current experiences, Time since onset of injury/illness/exacerbation, and 1 comorbidity: Parkinson's, fibromyalgia, osteoporosis   are also affecting patient's functional outcome.      REHAB POTENTIAL: Good   CLINICAL DECISION MAKING: Stable/uncomplicated   EVALUATION COMPLEXITY: Low     GOALS: Goals reviewed with patient? Yes   SHORT TERM GOALS: Target date: 11/24/2021   The patient will demonstrate knowledge of basic self care strategies and exercises to promote healing and pain relief Baseline: Goal status: MET   2.  The patient will report a 30% improvement in pain levels with functional activities which are currently difficult including standing and walking for shopping Baseline: 65% (01/23/22) Goal status: MET   3.  The patient will be able to walk in the grocery store 15 minutes with the shopping cart without major exacerbation of pain Baseline:  30 minutes (12/21/21) Goal status: MET       LONG TERM GOALS: Target date: 04/05/22   The patient will be independent in a safe self progression of a home exercise program to promote further recovery of function   Baseline:  Goal status: In progress    2.  The patient will report a 75% improvement in pain levels with functional activities which are currently difficult including standing and walking Baseline: 60-65% (01/23/22) Goal status: in progress    3.  The patient will be able to stand for light meal prep 8-10 min without exacerbation of pain Baseline: 25-30 min (02/08/22) Goal status: MET   4.   FOTO score improved from 45% to 52% indicating improved function with less pain Baseline: 56 (01/23/22)  Goal status: MET    5.  Walk > or = to 30-45 minutes in the store without significant limitation Baseline: 30 min max and has increased pain and fatigue after  Goal Status: NEW  6. Walk > or = to 410 feet in 3 minutes to improve community distance Baseline: 315 feet Goal  Status: NEW  7.  Perform sit to stand in < or = to 12 seconds to reduce falls risk. Baseline: 16 seconds  Goal Status: NEW     PLAN: PT FREQUENCY: 2x/week   PT DURATION: 8 weeks   PLANNED INTERVENTIONS: Therapeutic exercises, Therapeutic activity, Neuromuscular re-education, Balance training, Gait training, Patient/Family education, Joint mobilization, Aquatic Therapy, Dry Needling, Electrical stimulation, Moist heat, Taping, Ultrasound, Ionotophoresis 64m/ml Dexamethasone, and Manual therapy.   PLAN FOR NEXT SESSION:   2x/wk for aquatics and land based exercise for endurance, flexibility and pain management  JMyrene Galas PTA 03/01/22 8:03 PM   [id number] [place] [id number] # [phone] [phone]

## 2022-03-02 ENCOUNTER — Ambulatory Visit: Payer: Medicare Other | Admitting: Psychiatry

## 2022-03-06 ENCOUNTER — Ambulatory Visit: Payer: Medicare Other | Admitting: Physical Therapy

## 2022-03-06 ENCOUNTER — Encounter: Payer: Self-pay | Admitting: Physical Therapy

## 2022-03-06 ENCOUNTER — Encounter: Payer: Self-pay | Admitting: Family Medicine

## 2022-03-06 ENCOUNTER — Ambulatory Visit (INDEPENDENT_AMBULATORY_CARE_PROVIDER_SITE_OTHER): Payer: Medicare Other | Admitting: Psychiatry

## 2022-03-06 ENCOUNTER — Telehealth: Payer: Self-pay | Admitting: Neurology

## 2022-03-06 DIAGNOSIS — M6281 Muscle weakness (generalized): Secondary | ICD-10-CM | POA: Diagnosis not present

## 2022-03-06 DIAGNOSIS — F411 Generalized anxiety disorder: Secondary | ICD-10-CM | POA: Diagnosis not present

## 2022-03-06 DIAGNOSIS — M25552 Pain in left hip: Secondary | ICD-10-CM | POA: Diagnosis not present

## 2022-03-06 DIAGNOSIS — M5459 Other low back pain: Secondary | ICD-10-CM | POA: Diagnosis not present

## 2022-03-06 NOTE — Therapy (Signed)
OUTPATIENT PHYSICAL THERAPY TREATMENT NOTE   Patient Name: Tracy Malone MRN: [id number] DOB:26-Nov-1931, 88 y.o., female Today's Date: 03/06/2022  PCP: [name], MD REFERRING PROVIDER: Glennon Mac, DO    See note below for Objective Data and Assessment of Progress/Goals.       END OF SESSION:   PT End of Session - 03/06/22 1448     Visit Number 24    Date for PT Re-Evaluation 04/05/22    Authorization Type Medicare- KX at 15    Progress Note Due on Visit 29    PT Start Time 1447    PT Stop Time 1518    PT Time Calculation (min) 31 min    Activity Tolerance Patient limited by fatigue;Patient limited by pain;Patient limited by lethargy    Behavior During Therapy --                                 Past Medical History:  Diagnosis Date   Bell's palsy 1966   Hx: right side facial droop, resolved per patient 04/02/19   Carotid artery disease ([place]) 2010   on vascular screening;unchanged 2013.(could not tolerate simvastatin, no other statins tried)--<30% blockage bilat 07/2011   Chronic abdominal pain    Chronic fatigue and malaise    Claustrophobia    Cyst of left ovary    last imaging 06/2021, benign, no further f/u   Depression    treated in the past for years;stopped in 2010 for a years   Duodenal ulcer 1962   h/o   Dysrhythmia    ocassional PVC's, no current problems per patient on 04/02/19   Fibromyalgia    Frequent PVCs 07/2012   Seen by [facility]: benign, asymptomatic, normal EF   Gallstones 02/2021   on [place]   GERD (gastroesophageal reflux disease)    diet controlled   Glaucoma, narrow-angle    s/p laser surgery   History of hiatal hernia    during endoscopy   Hypothyroid 03/2007   IBS (irritable bowel syndrome)    Dr. Benson [place]   Ischemic colitis ([place]) 11/21/2018   no current problems per patient on 10/22/61   Lichenoid keratosis 84/53/6468   Dr.Stinehelfer   Ocular migraine    Osteoporosis 04/2010   Dr.Hawkes; later  consulted Dr. Buddy Duty (2022)   Panic attack    Parkinson disease ([place])    Parkinson's disease ([place]) 06/23/2016   Recurrent UTI    has cystocele-Dr.Grewal   Shingles 1999   h/o   Superficial thrombophlebitis 03/2009   RLE   Trochanteric bursitis 12/2008   bilateral   Past Surgical History:  Procedure Laterality Date   ABDOMINAL HYSTERECTOMY     BACK SURGERY  05/2019   CATARACT EXTRACTION, BILATERAL  1995, 1996   EYE SURGERY Bilateral    laser - glaucoma   Flexible sigmoidoscopy     KYPHOPLASTY N/A 04/03/2019   Procedure: KYPHOPLASTY L1;  Surgeon: Melina Schools, MD;  Location: [facility];  Service: Orthopedics;  Laterality: N/A;  90 mins   THYROIDECTOMY, PARTIAL  09/2005   L nodule; Dr. Harlow Asa   TONSILLECTOMY  1946   UPPER GI ENDOSCOPY  06/27/2012   VAGINAL HYSTERECTOMY  1971   and bladder repair.  Still has ovaries   WISDOM TOOTH EXTRACTION     Patient Active Problem List   Diagnosis Date Noted   Chronic left-sided low back pain with left-sided sciatica 01/26/2022   Senile purpura (  [place]) 11/04/2021   Calculus of gallbladder without cholecystitis without obstruction 11/01/2021   Rash 03/18/2021   Herpes zoster without complication 01/60/1093   Multiple allergies 03/18/2021   Frail elderly 03/18/2021   LUQ pain 10/29/2019   Poor appetite 10/29/2019   Loss of weight 10/29/2019   Nausea and vomiting 10/29/2019   Altered bowel habits 10/29/2019   Lumbar compression fracture ([place]) 04/03/2019   Compression fracture of L1 lumbar vertebra ([place]) 03/16/2019   Osteoporosis of multiple sites 03/16/2019   Essential tremor 02/06/2019   Ischemic colitis ([place])    Leukocytosis    Acute colitis 11/22/2018   Colitis 11/22/2018   Acute lower UTI 11/22/2018   Acute cystitis without hematuria    Parkinson's disease ([place]) 10/24/2018   Gait disturbance 11/28/2017   Chronic lymphocytic thyroiditis 04/24/2017   Status post removal of thyroid nodule 04/24/2017   Depression with anxiety  03/16/2017   Subcutaneous nodules 03/16/2017   Aortic atherosclerosis ([place]) 11/24/2016   Allergy to multiple antibiotics 10/07/2016   Seafood allergy, anaphylaxis, subsequent encounter 23/55/7322   Helicobacter pylori gastritis 10/07/2016   Fall 09/05/2016   Bloating 08/15/2016   Severe recurrent major depression without psychotic features ([place]) 04/13/2016    Class: Chronic   Rash and nonspecific skin eruption 03/01/2016   Leg pain, bilateral 03/01/2016   Functional dyspepsia 10/13/2015   Subacromial bursitis 03/02/2015   Resting tremor 02/01/2015   Epigastric fullness 10/12/2014   Early satiety 10/12/2014   Cystocele 12/30/2013   IBS (irritable bowel syndrome) 09/30/2013   GERD (gastroesophageal reflux disease) 06/27/2013   PVC's (premature ventricular contractions) 10/02/2012   Depressive disorder, not elsewhere classified 10/02/2012   Bradycardia 08/01/2012   Fatigue 08/01/2012   Anxiety state 05/13/2012   Osteopenia 05/13/2012   Hypothyroidism 05/13/2012    REFERRING DIAG:  M54.42,G89.29 (ICD-10-CM) - Chronic left-sided low back pain with left-sided sciatica  M51.36 (ICD-10-CM) - DDD (degenerative disc disease), lumbar  M16.12 (ICD-10-CM) - Primary osteoarthritis of left hip    THERAPY DIAG:  Other low back pain  Muscle weakness (generalized)  Pain in left hip  PERTINENT HISTORY: Parkinson's disease; L1 compression fracture 2020; osteoporosis; had nerve blocks in past and fell on left side; Osteoporosis;  fibromyalgia;  married 83 years; negative experience with previous PT  PRECAUTIONS: osteoporosis   SUBJECTIVE: Pt arrives teary and in pain. Pt reports she is so sick of this pain. Pt reports a lot of pain in her Lt SI and still is having trouble looking to the RT.   PAIN:  Are you having pain Yes  NPRS scale:  9/10 Lt SI, 8/10 Left neck especially when turning LT. Pain location: Lt lumbar and gluteals  Pain orientation: Bilateral  PAIN TYPE: aching Pain  description: constant, sore Aggravating factors: when I sleep, using my cane, activity Relieving factors: laying down, ice   OBJECTIVE: from evaluation on 10/27/21   DIAGNOSTIC FINDINGS:  Not recently   PATIENT SURVEYS:  FOTO 45%  12/21/21:  40 (pt is in a flare-up)     01/23/22: 56              SENSATION: WFL to light touch    POSTURE:  Decreased lumbar lordosis, increased thoracic kyphosis; right shoulder elevated in sitting and standing with slight right shift;  hand tremor   PALPATION: Tender points in bil gluteals and lumbar paraspinals;  decreased thoraco lumbar fascial mobility   LUMBAR ROM:  Uses partial squat to pick up items from floor;  unable to tolerate  supine lying but OK with right sidelying    Active  A/PROM  10/27/21  Flexion Not tested secondary to osteoporosis  Extension 0  Right lateral flexion    Left lateral flexion    Right rotation    Left rotation     (Blank rows = not tested) Trunk strength: decreased trunk extensor strength 4/5 and decreased activation of lower abdominals  LE ROM:  WFLs, right and left knee pain with full extension LE MMT:  Able to rise from standard chair without UE assist    LUMBAR SPECIAL TESTS:  Slump test: Negative   GAIT: Distance walked: 300 feet Assistive device utilized: Single point cane Level of assistance: Complete Independence Comments: shorter step length   02/08/22:  Functional tests:  5x sit to stand: 16 seconds  3 minute walk test: 315 feet using cane   TODAY'S TREATMENT    03/06/22: Nustep L1 x 71mn with PTA present to discuss current status.  Seated ball squeeze 2 sec hold 10x 1# hip to hip 10x, shoulder to shoulder 10x Cervical Rt rotation 5x in between sets   PTA suggested seeing if she can find a therapist that specializes in Geriatrics. Pt agreed and thought that was a good idea. She currently sees a general therapist.  Session ended early. PTA advised pt to go home and sleep/nap.        03/01/22: Pt  arrives for aquatic physical therapy. Treatment took place in 3.5-5.5 feet of water. Water temperature was 91 degrees F. Pt entered the pool via stairs step to step with heavy use of the hand rails. Pt requires buoyancy of water for support and to offload joints with strengthening exercises and pain reduction.  Seated water bench with 75% submersion Pt performed seated LE AROM exercises 20x in all planes, cervical rotation RT 5x slowly: very painful. This improved with 2 more sets . Seated at edge of bench with feet on the floor: shoulder flex/ext gentle 2x10, gentle breast stroke arms 1 min, then horizontal add/abd 2x10.  Water walking forward and backwards 6x with blue noodle for support: slow, small steps with close SBA. Seated decompression with large noodle behind patient. PTA providing assistance to help hold noodle down so it didn't increase shoulder pain.  Intermittent LE AROM concurrent with 3x5 of cervical rotation to the RT.     02/27/22: Mini tramp: 3 way weight shifting x 1 min each; pain with weightshifting forward and back in Lt SI Biceps curls 3# 2x10 bil each Seated marching: marching 2# added x20  Long arc quads: 2# added 2x10 Shoulder flexion 1# 2x10: C/O Lt neck pain: shoulder rolls 20x post and Rt rotation for cervical 10x Seated hip abduction with blue band 2x10: complains of LT SI pain when doing the ex but nothing increased after.  NuStep: level  2x 10  minutes-PTA present to discuss progress Seated hamstring stretch 2x20 seconds   02/13/22:   Mini tramp: 3 way weight shifting x 1 min each Biceps curls 3# 2x10 bil each Seated marching: marching 2# added x20  Long arc quads: 2# added 2x10 Shoulder flexion 1# 2x10 Seated hip abduction with blue band 2x10 NuStep: level  2x 10  minutes-PT present to discuss progress Seated hamstring stretch 2x20 seconds    PATIENT EDUCATION:  Education details:: [id number] aquatic info (11/22/21) Person educated: Patient Education method:  Explanation Education comprehension: verbalized understanding     HOME EXERCISE PROGRAM: Access Code: [email or web address]: [email or web address] Date: 11/09/2021 Prepared by: KClaiborne Billings  Exercises - Seated Hamstring Stretch  - 3 x daily - 7 x weekly - 1 sets - 3 reps - 20 hold - Seated Figure 4 Piriformis Stretch  - 1 x daily - 7 x weekly - 1 sets - 3 reps - 20 hold - Seated Hip Adduction Isometrics with Ball  - 2 x daily - 7 x weekly - 1 sets - 10 reps - 5 hold - Seated Hip Abduction with Resistance  - 2 x daily - 7 x weekly - 1 sets - 10 reps   ASSESSMENT:   CLINICAL IMPRESSION: Pt arrives teary and in a lot of pain. She spoke with her therapist this AM because she was feeling like giving up. She plans to see Dr Glennon Mac next week and ask for and SI injection. Exercises kept very simple, low level and shortend time.   OBJECTIVE IMPAIRMENTS difficulty walking, decreased strength, increased fascial restrictions, and pain.     ACTIVITY LIMITATIONS cleaning, meal prep, and shopping.    PERSONAL FACTORS Age, Past/current experiences, Time since onset of injury/illness/exacerbation, and 1 comorbidity: Parkinson's, fibromyalgia, osteoporosis   are also affecting patient's functional outcome.      REHAB POTENTIAL: Good   CLINICAL DECISION MAKING: Stable/uncomplicated   EVALUATION COMPLEXITY: Low     GOALS: Goals reviewed with patient? Yes   SHORT TERM GOALS: Target date: 11/24/2021   The patient will demonstrate knowledge of basic self care strategies and exercises to promote healing and pain relief Baseline: Goal status: MET   2.  The patient will report a 30% improvement in pain levels with functional activities which are currently difficult including standing and walking for shopping Baseline: 65% (01/23/22) Goal status: MET   3.  The patient will be able to walk in the grocery store 15 minutes with the shopping cart without major exacerbation of pain Baseline:  30 minutes  (12/21/21) Goal status: MET       LONG TERM GOALS: Target date: 04/05/22   The patient will be independent in a safe self progression of a home exercise program to promote further recovery of function   Baseline:  Goal status: In progress    2.  The patient will report a 75% improvement in pain levels with functional activities which are currently difficult including standing and walking Baseline: 60-65% (01/23/22) Goal status: in progress    3.  The patient will be able to stand for light meal prep 8-10 min without exacerbation of pain Baseline: 25-30 min (02/08/22) Goal status: MET   4.   FOTO score improved from 45% to 52% indicating improved function with less pain Baseline: 56 (01/23/22) Goal status: MET    5.  Walk > or = to 30-45 minutes in the store without significant limitation Baseline: 30 min max and has increased pain and fatigue after  Goal Status: NEW  6. Walk > or = to 410 feet in 3 minutes to improve community distance Baseline: 315 feet Goal Status: NEW  7.  Perform sit to stand in < or = to 12 seconds to reduce falls risk. Baseline: 16 seconds  Goal Status: NEW     PLAN: PT FREQUENCY: 2x/week   PT DURATION: 8 weeks   PLANNED INTERVENTIONS: Therapeutic exercises, Therapeutic activity, Neuromuscular re-education, Balance training, Gait training, Patient/Family education, Joint mobilization, Aquatic Therapy, Dry Needling, Electrical stimulation, Moist heat, Taping, Ultrasound, Ionotophoresis 72m/ml Dexamethasone, and Manual therapy.   PLAN FOR NEXT SESSION:   2x/wk for aquatics and land based exercise for endurance,  flexibility and pain management  Myrene Galas, PTA 03/06/22 3:19 PM   [facility] [address], [place], [place] [address] Phone # [phone] Fax [phone]

## 2022-03-06 NOTE — Progress Notes (Signed)
[facility] Counselor/Therapist Progress Note  Patient ID: Tracy Malone, MRN: [id number],    Date: 03/06/2022  Time Spent: 50 minutes   Treatment Type: Individual Therapy  Reported Symptoms: anxiety, depression, no SI  Mental Status Exam:  Appearance:   Casual     Behavior:  Appropriate and Sharing  Motor:  Walking without her cane today and did fine.   Speech/Language:   Clear and Coherent  Affect:  Anxious, depressed  Mood:  anxious and depressed  Thought process:  goal directed  Thought content:    Rumination  Sensory/Perceptual disturbances:    WNL  Orientation:  oriented to person, place, time/date, situation, day of week, month of year, year, and stated date of March 06, 2022  Attention:  Good  Concentration:  Good  Memory:  WNL  Fund of knowledge:   Good  Insight:    Good and Fair  Judgment:   Good  Impulse Control:  Good   Risk Assessment: Danger to Self:  No Self-injurious Behavior: No Danger to Others: No Duty to Warn:no Physical Aggression / Violence:No  Access to Firearms a concern: No  Gang Involvement:No   Subjective: Patient in today reporting anxiety and depression and today vents her thoughts and fears of future, and realizing she worries too much about too many things.  "I worry all the time".  Discussed the "benefits" of worrying per patient and in our discussion she was able to see how worrying really does not help in any way and if anything steals her ability to be calmer in the moment and in some cases happy in the moment.  Used several examples with patient as we talked through this in session today.  She admits that the key will be trying to hold on to this pattern as she worries a lot every day and she would have to be interrupting at all day long.  Did encourage her that the more she interrupts the anxiety, eventually, she will find that she is less anxious.  Also worked on some "replacement thoughts" that seem to be helpful to patient.   Anxiety and depression mostly related to her anxious thoughts and also some gradual decline in her aging with multiple health issues.  Acknowledged the positives and how well she is doing at her current age of 62.  She seemed proud of that and that she and husband have been married for almost 3 years.  Interventions: Cognitive Behavioral Therapy  Long term goal: Develop the ability to recognize, accept, and cope with feelings of anxiety and depression. Short term goal: Verbalize any unresolved grief issues that may be contributing to anxiety and depression. Strategy: Replace negative self-defeating self talk with verbalization of realistic and positive cognitive messages to lessen depression/anxiety and improve mood.  Diagnosis:   ICD-10-CM   1. Generalized anxiety disorder  F41.1      Plan:  Patient in today showing motivation and and active participation in working on her anxiety and trying to lessen that behavior more throughout the day.  Acknowledges that she does have chronic anxiety but has been able to interrupt it at times when we intentionally work on it in sessions.  Discussed more today about her following through more consistently at home and how she might can do this more successfully, including having her husband support her with this.  Positive self-talk encouraged.  Looking forward to her adult son being to visit her soon as she has not seen "in quite a  while".  (Not all details included in this note due to patient privacy needs.)  Patient encouraged to continue her goal directed behaviors and especially reaching out to neighbors more, making contact with her church, and practicing regular positive thoughts. Encouraged patient in her practice of more positive behaviors as we discussed in session including: Participate in some activities that she has enjoyed previously including reading/phone calls with friends/attend or reach out to her church, positive self-care and self-talk,  listening to music which she finds very emotionally calming, getting books from the nearby [employer] which she has stated she enjoys, did not defy and focus more on the positives versus negatives, allow for good sleep patterns and not oversleeping during the day with naps, stay in the present focusing on what she can control or change, remaining contact with family members and friends who are supportive, and recognize the strengths she shows working with goal directed behaviors to move in a direction that supports her improved emotional health and overall outlook.  Goal review and progress/challenges noted with patient.  Next appointment within 3 to 4 weeks.  This record has been created using [employer].  Chart creation errors have been sought, but may not always have been located and corrected.  Such creation errors do not reflect on the standard of medical care provided.   Shanon Ace, LCSW

## 2022-03-06 NOTE — Telephone Encounter (Signed)
Called the patient back. She states that she isn't eating well and not feeling well. She is jittery and crying all the time . Pt states that she feels this is related to the carbidopa levodopa. Advised that with her being on this medication for as long as she has, I do not feel that the medication is causing her symptoms. I asked the patient has she touched base with PCP and she states that she has but they "don't do anything". She states that she is already on antidepressant. I advised that her symptoms all turn to depression. She states that she doesn't have an appetite. She mentioned that her tremors seem to be worsening. I was able to work her in this week with Dr Felecia Shelling but highly encouraged the patient to continue to follow with PCP for her emotional concerns. Advised they could get her in with a counselor and the patient states that she is already established with someone and had a visit today.

## 2022-03-06 NOTE — Telephone Encounter (Signed)
Pt is calling and said she needs a sooner appointment because she feels its the medication and need to see provider before December.

## 2022-03-07 ENCOUNTER — Ambulatory Visit (INDEPENDENT_AMBULATORY_CARE_PROVIDER_SITE_OTHER): Payer: Medicare Other | Admitting: Sports Medicine

## 2022-03-07 VITALS — HR 70 | Ht 59.0 in | Wt 95.0 lb

## 2022-03-07 DIAGNOSIS — R0781 Pleurodynia: Secondary | ICD-10-CM | POA: Diagnosis not present

## 2022-03-07 DIAGNOSIS — G8929 Other chronic pain: Secondary | ICD-10-CM | POA: Diagnosis not present

## 2022-03-07 DIAGNOSIS — S2242XS Multiple fractures of ribs, left side, sequela: Secondary | ICD-10-CM

## 2022-03-07 DIAGNOSIS — M5136 Other intervertebral disc degeneration, lumbar region: Secondary | ICD-10-CM

## 2022-03-07 DIAGNOSIS — M5442 Lumbago with sciatica, left side: Secondary | ICD-10-CM

## 2022-03-07 NOTE — Patient Instructions (Addendum)
Good to see you  Epidural referral  Follow up 2 weeks after your injection

## 2022-03-08 ENCOUNTER — Telehealth: Payer: Self-pay | Admitting: Sports Medicine

## 2022-03-08 ENCOUNTER — Ambulatory Visit: Payer: Medicare Other | Admitting: Physical Therapy

## 2022-03-08 ENCOUNTER — Other Ambulatory Visit: Payer: Medicare Other | Admitting: Family Medicine

## 2022-03-08 ENCOUNTER — Encounter: Payer: Self-pay | Admitting: Physical Therapy

## 2022-03-08 VITALS — BP 118/78 | HR 73 | Resp 18

## 2022-03-08 DIAGNOSIS — G8929 Other chronic pain: Secondary | ICD-10-CM | POA: Diagnosis not present

## 2022-03-08 DIAGNOSIS — M25552 Pain in left hip: Secondary | ICD-10-CM

## 2022-03-08 DIAGNOSIS — M5459 Other low back pain: Secondary | ICD-10-CM

## 2022-03-08 DIAGNOSIS — M6281 Muscle weakness (generalized): Secondary | ICD-10-CM | POA: Diagnosis not present

## 2022-03-08 DIAGNOSIS — M5442 Lumbago with sciatica, left side: Secondary | ICD-10-CM | POA: Diagnosis not present

## 2022-03-08 DIAGNOSIS — R634 Abnormal weight loss: Secondary | ICD-10-CM | POA: Diagnosis not present

## 2022-03-08 DIAGNOSIS — F418 Other specified anxiety disorders: Secondary | ICD-10-CM | POA: Diagnosis not present

## 2022-03-08 NOTE — Addendum Note (Signed)
Addended by: Pincus Badder R on: 03/08/2022 08:27 AM   Modules accepted: Orders

## 2022-03-08 NOTE — Telephone Encounter (Signed)
Patient called stating that she is currently waiting to hear back from [facility] on if they can or can't do the epidural. She asked if Dr Glennon Mac could do an injection just on her left side to help the pain until she is able to get in for the epidural?  Please advise.  \

## 2022-03-08 NOTE — Progress Notes (Unsigned)
[facility] Palliative Care Consult Note Telephone: [phone]  Fax: [phone]    Date of encounter: 03/08/22 12:47 PM PATIENT NAME: Tracy Malone [place] [address]   [phone] (home)  DOB: 1932/04/27 MRN: [id number] PRIMARY CARE PROVIDER:    [name], MD,  [place] [id number] [phone]  REFERRING PROVIDER:   Rita Malone, [address] [place],  [place] [address] [phone]  RESPONSIBLE PARTY:    Contact Information     Name Relation Home Work Mobile   [place] Spouse [phone]     Tracy Malone Daughter [phone]  [phone]        I met face to face with patient and spouse in their home. Palliative Care was asked to follow this patient by consultation request of  Tracy Ohara, MD to address advance care planning and complex medical decision making. This is a follow up visit.                           ASSESSMENT , SYMPTOM MANAGEMENT AND PLAN / RECOMMENDATIONS:   Depression with Anxiety Encouraged joint activities with spouse, sitting on patio when cooler. Doing crafts together, playing cards/checkers or chess. Journaling reminiscences of positive times in their lives together and as a family particularly when negative or intrusive thoughts begin. Encourage continued participation with PT  Chronic left sided low back pain with sciatica of left side Continue PT and water exercise.  May benefit from massage therapy to decrease rigidity and spasm in back/leg. Encourage periodic movement to maintain mobility Use of lumbar roll when sitting  3.  Weight loss Slight weight loss Encouraged pt to use protein powder and speak with nutritionist about foods that will not exacerbate her colitis or IBS Uses Bentyl when pain is worst    Advance Care Planning/Goals of Care: Goals include to maximize quality of life and symptom management.   CODE STATUS: MOST as  of 11/02/21: Attempt CPR with limited additional interventions, do not use intubation or mechanical ventilation Antibiotics if indicated IV fluids and feeding tube for a defined trial period    Follow up Palliative Care Visit: Palliative care will continue to follow for complex medical decision making, advance care planning, and clarification of goals. Return 4 weeks or prn.    This visit was coded based on medical decision making (MDM).  PPS: 60%  HOSPICE ELIGIBILITY/DIAGNOSIS: TBD  Chief Complaint:  Palliative Care is following for chronic medical management in setting of Parkinsons.  HISTORY OF PRESENT ILLNESS:  Tracy Malone is a 88 y.o. year old female with Parkinson's Disease, osteopenia, lumbar compression fracture, aortic atherosclerosis, GERD, IBS, functional dyspepsia, hx of colitis and ischemic colitis, chronic lymphocytic thyroiditis, cystocele, early satiety, depression with anxiety, low back pain with sciatica, bilateral leg pain and hx of trochanteric bursitis. She continues to c/o back pain and intestinal bloating and pain with certain foods.  She states she is getting conflicting advice on what to eat and what to avoid as well as whether or not she should be taking Bentyl with severe spasm pain.  Denies dysuria.  States she is currently "in a rut where I worry about him and he worries about me." She states she has anxiety and depression "but I refuse to give up although it feels like I have been trapped in jail since the pandemic because I don't want to wear a mask and can't breathe so we just  sit here and look at the idiot box."   She verbalizes not liking to take medications including her Sinemet.  She wants to follow up with a Neurology specialist at [facility] that she has spoken with and done some research about a procedure to help with tremors.  She states she has been diagnosed with both Parkinson's and essential tremor. She is independent with bathing, dressing and all ADLs.   She has had no falls. She continues to work with PT, denies falls and continues to do some water classes.  Pain in low back and occasionally refers down left leg to knee and sometimes to heel.  Doing outpatient PT and aquatic therapy.  Using TENS unit with some relief. Back pain interrupts sleep.  Pain is a 5 on 10 scale currently, at best is a 4 on a 10 scale and rarely the worst pain is 10/10.  Standing, sitting or laying for prolonged periods becomes painful.  Had gradual weight loss over 3 year period from 117 when she had back surgery to 97.4 lbs currently.  Eats small amounts, frequently due to delayed gastric emptying.  She states she has had worsening since she had the last Baring vaccination and since her back surgery. Appetite is fair.  Has had eye problems which sometimes gets her up at night.  She is using Systane preservative free.  She states her balance is good.    History obtained from review of EMR, discussion with spouse and/or Tracy Malone.   Left rib xray 01/30/22 as she was continuing to have pain from a fall 1 year ago with no fracture on xray.  I reviewed available labs, medications, imaging, studies and related documents from the EMR.  Records reviewed and summarized above.   ROS General: NAD Cardiovascular: denies chest pain, denies DOE Pulmonary: denies cough, denies increased SOB Abdomen: endorses "fair" appetite, denies constipation, endorses continence of bowel GU: denies dysuria, endorses continence of urine with rare urge incontinence MSK:  denies weakness after significant exertion, chronic low back pain and sciatic pain  no falls reported Neuro:  Low back pain with no numbness and intermittent bouts of insomnia due to back pain Psych: Endorses depressed and anxious mood  Physical Exam: Current and past weights: currently weight is 95 lbs as of 03/07/22, prior weight was 97.4 lbs  Constitutional: NAD General: thin, WD  CV: S1S2, Occasionally irregular, no LE  edema Pulmonary: CTAB, no increased work of breathing, no cough, room air Abdomen:  normo-active BS + 4 quadrants, soft and non tender, no ascites MSK: no sarcopenia, moves all extremities, ambulatory Neuro:  no generalized weakness,  no cognitive impairment Psych: non-anxious affect, A and O x 3 Hem/lymph/immuno: no widespread bruising   Thank you for the opportunity to participate in the care of Ms. Procter.  The palliative care team will continue to follow. Please call our office at [phone] if we can be of additional assistance.   Marijo Conception, FNP -C  COVID-19 PATIENT SCREENING TOOL Asked and negative response unless otherwise noted:   Have you had symptoms of covid, tested positive or been in contact with someone with symptoms/positive test in the past 5-10 days?  No

## 2022-03-08 NOTE — Therapy (Signed)
OUTPATIENT PHYSICAL THERAPY TREATMENT NOTE   Patient Name: Tracy Malone MRN: [id number] DOB:December 24, 1931, 88 y.o., female Today's Date: 03/08/2022  PCP: [name], MD REFERRING PROVIDER: Glennon Mac, DO    See note below for Objective Data and Assessment of Progress/Goals.       END OF SESSION:   PT End of Session - 03/08/22 1151     Visit Number 25    Date for PT Re-Evaluation 04/05/22    Authorization Type Medicare- KX at 15    Progress Note Due on Visit 57    PT Start Time 1015    PT Stop Time 1100    PT Time Calculation (min) 45 min    Activity Tolerance Patient tolerated treatment well;No increased pain;Patient limited by pain    Behavior During Therapy [facility] for tasks assessed/performed                                 Past Medical History:  Diagnosis Date   Bell's palsy 1966   Hx: right side facial droop, resolved per patient 04/02/19   Carotid artery disease ([place]) 2010   on vascular screening;unchanged 2013.(could not tolerate simvastatin, no other statins tried)--<30% blockage bilat 07/2011   Chronic abdominal pain    Chronic fatigue and malaise    Claustrophobia    Cyst of left ovary    last imaging 06/2021, benign, no further f/u   Depression    treated in the past for years;stopped in 2010 for a years   Duodenal ulcer 1962   h/o   Dysrhythmia    ocassional PVC's, no current problems per patient on 04/02/19   Fibromyalgia    Frequent PVCs 07/2012   Seen by [facility]: benign, asymptomatic, normal EF   Gallstones 02/2021   on [place]   GERD (gastroesophageal reflux disease)    diet controlled   Glaucoma, narrow-angle    s/p laser surgery   History of hiatal hernia    during endoscopy   Hypothyroid 03/2007   IBS (irritable bowel syndrome)    Dr. Benson [place]   Ischemic colitis ([place]) 11/21/2018   no current problems per patient on 01/27/81   Lichenoid keratosis 42/35/3614   Dr.Stinehelfer   Ocular migraine    Osteoporosis 04/2010    Dr.Hawkes; later consulted Dr. Buddy Duty (2022)   Panic attack    Parkinson disease ([place])    Parkinson's disease ([place]) 06/23/2016   Recurrent UTI    has cystocele-Dr.Grewal   Shingles 1999   h/o   Superficial thrombophlebitis 03/2009   RLE   Trochanteric bursitis 12/2008   bilateral   Past Surgical History:  Procedure Laterality Date   ABDOMINAL HYSTERECTOMY     BACK SURGERY  05/2019   CATARACT EXTRACTION, BILATERAL  1995, 1996   EYE SURGERY Bilateral    laser - glaucoma   Flexible sigmoidoscopy     KYPHOPLASTY N/A 04/03/2019   Procedure: KYPHOPLASTY L1;  Surgeon: Melina Schools, MD;  Location: [place];  Service: Orthopedics;  Laterality: N/A;  90 mins   THYROIDECTOMY, PARTIAL  09/2005   L nodule; Dr. Harlow Asa   TONSILLECTOMY  1946   UPPER GI ENDOSCOPY  06/27/2012   VAGINAL HYSTERECTOMY  1971   and bladder repair.  Still has ovaries   WISDOM TOOTH EXTRACTION     Patient Active Problem List   Diagnosis Date Noted   Chronic left-sided low back pain with left-sided sciatica 01/26/2022  Senile purpura ([place]) 11/04/2021   Calculus of gallbladder without cholecystitis without obstruction 11/01/2021   Rash 03/18/2021   Herpes zoster without complication 60/73/7106   Multiple allergies 03/18/2021   Frail elderly 03/18/2021   LUQ pain 10/29/2019   Poor appetite 10/29/2019   Loss of weight 10/29/2019   Nausea and vomiting 10/29/2019   Altered bowel habits 10/29/2019   Lumbar compression fracture ([place]) 04/03/2019   Compression fracture of L1 lumbar vertebra ([place]) 03/16/2019   Osteoporosis of multiple sites 03/16/2019   Essential tremor 02/06/2019   Ischemic colitis ([place])    Leukocytosis    Acute colitis 11/22/2018   Colitis 11/22/2018   Acute lower UTI 11/22/2018   Acute cystitis without hematuria    Parkinson's disease ([place]) 10/24/2018   Gait disturbance 11/28/2017   Chronic lymphocytic thyroiditis 04/24/2017   Status post removal of thyroid nodule 04/24/2017   Depression  with anxiety 03/16/2017   Subcutaneous nodules 03/16/2017   Aortic atherosclerosis ([place]) 11/24/2016   Allergy to multiple antibiotics 10/07/2016   Seafood allergy, anaphylaxis, subsequent encounter 26/94/8546   Helicobacter pylori gastritis 10/07/2016   Fall 09/05/2016   Bloating 08/15/2016   Severe recurrent major depression without psychotic features ([place]) 04/13/2016    Class: Chronic   Rash and nonspecific skin eruption 03/01/2016   Leg pain, bilateral 03/01/2016   Functional dyspepsia 10/13/2015   Subacromial bursitis 03/02/2015   Resting tremor 02/01/2015   Epigastric fullness 10/12/2014   Early satiety 10/12/2014   Cystocele 12/30/2013   IBS (irritable bowel syndrome) 09/30/2013   GERD (gastroesophageal reflux disease) 06/27/2013   PVC's (premature ventricular contractions) 10/02/2012   Depressive disorder, not elsewhere classified 10/02/2012   Bradycardia 08/01/2012   Fatigue 08/01/2012   Anxiety state 05/13/2012   Osteopenia 05/13/2012   Hypothyroidism 05/13/2012    REFERRING DIAG:  M54.42,G89.29 (ICD-10-CM) - Chronic left-sided low back pain with left-sided sciatica  M51.36 (ICD-10-CM) - DDD (degenerative disc disease), lumbar  M16.12 (ICD-10-CM) - Primary osteoarthritis of left hip    THERAPY DIAG:  Other low back pain  Muscle weakness (generalized)  Pain in left hip  PERTINENT HISTORY: Parkinson's disease; L1 compression fracture 2020; osteoporosis; had nerve blocks in past and fell on left side; Osteoporosis;  fibromyalgia;  married 26 years; negative experience with previous PT  PRECAUTIONS: osteoporosis   SUBJECTIVE: Pt arrives teary and in pain. Pt reports she will have an epidural next week.     PAIN:  Are you having pain Yes  NPRS scale:  6/10 Lt SI, 8/10 Left neck especially when turning LT. Pain location: Lt lumbar and gluteals  Pain orientation: Bilateral  PAIN TYPE: aching Pain description: constant, sore Aggravating factors: when I sleep,  using my cane, activity Relieving factors: laying down, ice   OBJECTIVE: from evaluation on 10/27/21   DIAGNOSTIC FINDINGS:  Not recently   PATIENT SURVEYS:  FOTO 45%  12/21/21:  40 (pt is in a flare-up)     01/23/22: 56              SENSATION: WFL to light touch    POSTURE:  Decreased lumbar lordosis, increased thoracic kyphosis; right shoulder elevated in sitting and standing with slight right shift;  hand tremor   PALPATION: Tender points in bil gluteals and lumbar paraspinals;  decreased thoraco lumbar fascial mobility   LUMBAR ROM:  Uses partial squat to pick up items from floor;  unable to tolerate supine lying but OK with right sidelying    Active  A/PROM  10/27/21  Flexion Not tested secondary to osteoporosis  Extension 0  Right lateral flexion    Left lateral flexion    Right rotation    Left rotation     (Blank rows = not tested) Trunk strength: decreased trunk extensor strength 4/5 and decreased activation of lower abdominals  LE ROM:  WFLs, right and left knee pain with full extension LE MMT:  Able to rise from standard chair without UE assist    LUMBAR SPECIAL TESTS:  Slump test: Negative   GAIT: Distance walked: 300 feet Assistive device utilized: Single point cane Level of assistance: Complete Independence Comments: shorter step length   02/08/22:  Functional tests:  5x sit to stand: 16 seconds  3 minute walk test: 315 feet using cane   TODAY'S TREATMENT    03/08/22:Pt arrives for aquatic physical therapy. Treatment took place in 3.5-5.5 feet of water. Water temperature was 91 degrees F. Pt entered the pool via stairs step to step with heavy use of the hand rails. Pt requires buoyancy of water for support and to offload joints with strengthening exercises and pain reduction.  Seated water bench with 75% submersion Pt performed seated LE AROM exercises 20x in all planes, cervical rotation RT 5x slowly: very painful. This improved with 2 more sets . Seated  at edge of bench with feet on the floor: shoulder flex/ext gentle 2x10, gentle breast stroke arms 1 min, then horizontal add/abd 2x10.  Water walking forward and backwards 2x with blue noodle for support: slow, small steps with close SBA. Manual P/ROM Bil toes and ankle with Lt hip and buttocks soft tissue work in water.      03/06/22: Nustep L1 x 84min with PTA present to discuss current status.  Seated ball squeeze 2 sec hold 10x 1# hip to hip 10x, shoulder to shoulder 10x Cervical Rt rotation 5x in between sets   PTA suggested seeing if she can find a therapist that specializes in Geriatrics. Pt agreed and thought that was a good idea. She currently sees a general therapist.  Session ended early. PTA advised pt to go home and sleep/nap.        03/01/22: Pt arrives for aquatic physical therapy. Treatment took place in 3.5-5.5 feet of water. Water temperature was 91 degrees F. Pt entered the pool via stairs step to step with heavy use of the hand rails. Pt requires buoyancy of water for support and to offload joints with strengthening exercises and pain reduction.  Seated water bench with 75% submersion Pt performed seated LE AROM exercises 20x in all planes, cervical rotation RT 5x slowly: very painful. This improved with 2 more sets . Seated at edge of bench with feet on the floor: shoulder flex/ext gentle 2x10, gentle breast stroke arms 1 min, then horizontal add/abd 2x10.  Water walking forward and backwards 6x with blue noodle for support: slow, small steps with close SBA. Seated decompression with large noodle behind patient. PTA providing assistance to help hold noodle down so it didn't increase shoulder pain.  Intermittent LE AROM concurrent with 3x5 of cervical rotation to the RT.     02/27/22: Mini tramp: 3 way weight shifting x 1 min each; pain with weightshifting forward and back in Lt SI Biceps curls 3# 2x10 bil each Seated marching: marching 2# added x20  Long arc quads: 2# added  2x10 Shoulder flexion 1# 2x10: C/O Lt neck pain: shoulder rolls 20x post and Rt rotation for cervical 10x Seated hip abduction with blue band 2x10:  complains of LT SI pain when doing the ex but nothing increased after.  NuStep: level  2x 10  minutes-PTA present to discuss progress Seated hamstring stretch 2x20 seconds   02/13/22:   Mini tramp: 3 way weight shifting x 1 min each Biceps curls 3# 2x10 bil each Seated marching: marching 2# added x20  Long arc quads: 2# added 2x10 Shoulder flexion 1# 2x10 Seated hip abduction with blue band 2x10 NuStep: level  2x 10  minutes-PT present to discuss progress Seated hamstring stretch 2x20 seconds    PATIENT EDUCATION:  Education details: [id number], aquatic info (11/22/21) Person educated: Patient Education method: Explanation Education comprehension: verbalized understanding     HOME EXERCISE PROGRAM: Access Code: [email or web address] URL: [email or web address] Date: 11/09/2021 Prepared by: Tresa Endo  Exercises - Seated Hamstring Stretch  - 3 x daily - 7 x weekly - 1 sets - 3 reps - 20 hold - Seated Figure 4 Piriformis Stretch  - 1 x daily - 7 x weekly - 1 sets - 3 reps - 20 hold - Seated Hip Adduction Isometrics with Ball  - 2 x daily - 7 x weekly - 1 sets - 10 reps - 5 hold - Seated Hip Abduction with Resistance  - 2 x daily - 7 x weekly - 1 sets - 10 reps   ASSESSMENT:   CLINICAL IMPRESSION: Pt arrives in slightly less pain than she was on Monday but spirits remain low. One episode of teariness. Pt will receive an epidural shot next week. Pian limited treatment.   OBJECTIVE IMPAIRMENTS difficulty walking, decreased strength, increased fascial restrictions, and pain.     ACTIVITY LIMITATIONS cleaning, meal prep, and shopping.    PERSONAL FACTORS Age, Past/current experiences, Time since onset of injury/illness/exacerbation, and 1 comorbidity: Parkinson's, fibromyalgia, osteoporosis   are also affecting patient's functional outcome.       REHAB POTENTIAL: Good   CLINICAL DECISION MAKING: Stable/uncomplicated   EVALUATION COMPLEXITY: Low     GOALS: Goals reviewed with patient? Yes   SHORT TERM GOALS: Target date: 11/24/2021   The patient will demonstrate knowledge of basic self care strategies and exercises to promote healing and pain relief Baseline: Goal status: MET   2.  The patient will report a 30% improvement in pain levels with functional activities which are currently difficult including standing and walking for shopping Baseline: 65% (01/23/22) Goal status: MET   3.  The patient will be able to walk in the grocery store 15 minutes with the shopping cart without major exacerbation of pain Baseline:  30 minutes (12/21/21) Goal status: MET       LONG TERM GOALS: Target date: 04/05/22   The patient will be independent in a safe self progression of a home exercise program to promote further recovery of function   Baseline:  Goal status: In progress    2.  The patient will report a 75% improvement in pain levels with functional activities which are currently difficult including standing and walking Baseline: 60-65% (01/23/22) Goal status: in progress    3.  The patient will be able to stand for light meal prep 8-10 min without exacerbation of pain Baseline: 25-30 min (02/08/22) Goal status: MET   4.   FOTO score improved from 45% to 52% indicating improved function with less pain Baseline: 56 (01/23/22) Goal status: MET    5.  Walk > or = to 30-45 minutes in the store without significant limitation Baseline: 30 min max and has increased pain and fatigue after  Goal Status: NEW  6. Walk > or = to 410 feet in 3 minutes to improve community distance Baseline: 315 feet Goal Status: NEW  7.  Perform sit to stand in < or = to 12 seconds to reduce falls risk. Baseline: 16 seconds  Goal Status: NEW     PLAN: PT FREQUENCY: 2x/week   PT DURATION: 8 weeks   PLANNED INTERVENTIONS: Therapeutic  exercises, Therapeutic activity, Neuromuscular re-education, Balance training, Gait training, Patient/Family education, Joint mobilization, Aquatic Therapy, Dry Needling, Electrical stimulation, Moist heat, Taping, Ultrasound, Ionotophoresis 15m/ml Dexamethasone, and Manual therapy.   PLAN FOR NEXT SESSION:   2x/wk for aquatics and land based exercise for endurance, flexibility and pain management  JMyrene Galas PTA 03/08/22 4:36 PM   [facility] [address] [place] [id number] # [phone] [phone]

## 2022-03-09 ENCOUNTER — Ambulatory Visit: Payer: Medicare Other | Admitting: Neurology

## 2022-03-09 ENCOUNTER — Ambulatory Visit (INDEPENDENT_AMBULATORY_CARE_PROVIDER_SITE_OTHER): Payer: Medicare Other

## 2022-03-09 ENCOUNTER — Encounter: Payer: Self-pay | Admitting: Family Medicine

## 2022-03-09 DIAGNOSIS — R0781 Pleurodynia: Secondary | ICD-10-CM | POA: Diagnosis not present

## 2022-03-09 DIAGNOSIS — S2242XS Multiple fractures of ribs, left side, sequela: Secondary | ICD-10-CM | POA: Diagnosis not present

## 2022-03-09 DIAGNOSIS — G8929 Other chronic pain: Secondary | ICD-10-CM

## 2022-03-09 DIAGNOSIS — M5442 Lumbago with sciatica, left side: Secondary | ICD-10-CM

## 2022-03-09 MED ORDER — KETOROLAC TROMETHAMINE 30 MG/ML IJ SOLN
30.0000 mg | Freq: Once | INTRAMUSCULAR | Status: AC
  Administered 2022-03-09: 30 mg via INTRAMUSCULAR

## 2022-03-09 MED ORDER — METHYLPREDNISOLONE ACETATE 40 MG/ML IJ SUSP
40.0000 mg | Freq: Once | INTRAMUSCULAR | Status: AC
  Administered 2022-03-09: 40 mg via INTRAMUSCULAR

## 2022-03-09 NOTE — Progress Notes (Signed)
Patient given depo medrol '40mg'$  and Toradol '30mg'$  per Dr. Glennon Mac. We had checked to make sure there were no serious allergic reactions that could happen if given medications. Patient was given Toradol in the right upper outer buttock and the depo medrol in the left upper outer buttock and tolerated injections well.

## 2022-03-10 ENCOUNTER — Telehealth: Payer: Self-pay | Admitting: Sports Medicine

## 2022-03-10 NOTE — Telephone Encounter (Signed)
Pt husband called to speak with Dr. Glennon Mac or assistant. Pt struggling with possible reaction to injection given yesterday, nausea, unable to eat, pain.  Would like a call asap.

## 2022-03-10 NOTE — Telephone Encounter (Signed)
Read Dr. Marisue Brooklyn response to both pt and her husband. Pt expressed understanding and may go to [facility] ED if not better.

## 2022-03-13 ENCOUNTER — Encounter: Payer: Self-pay | Admitting: Physical Therapy

## 2022-03-13 ENCOUNTER — Ambulatory Visit: Payer: Medicare Other | Admitting: Physical Therapy

## 2022-03-13 DIAGNOSIS — M6281 Muscle weakness (generalized): Secondary | ICD-10-CM

## 2022-03-13 DIAGNOSIS — M5459 Other low back pain: Secondary | ICD-10-CM

## 2022-03-13 DIAGNOSIS — M25552 Pain in left hip: Secondary | ICD-10-CM

## 2022-03-13 NOTE — Therapy (Signed)
OUTPATIENT PHYSICAL THERAPY TREATMENT NOTE   Patient Name: Tracy Malone MRN: [id number] DOB:Dec 05, 1931, 88 y.o., female Today's Date: 03/13/2022  PCP: [name], MD REFERRING PROVIDER: Glennon Mac, DO    See note below for Objective Data and Assessment of Progress/Goals.       END OF SESSION:   PT End of Session - 03/13/22 1246     Visit Number 26    Date for PT Re-Evaluation 04/05/22    Authorization Type Medicare- KX at 15    Progress Note Due on Visit 29    PT Start Time 1227    PT Stop Time 1307    PT Time Calculation (min) 40 min    Behavior During Therapy Anxious   crying and very upset                                Past Medical History:  Diagnosis Date   Bell's palsy 1966   Hx: right side facial droop, resolved per patient 04/02/19   Carotid artery disease ([place]) 2010   on vascular screening;unchanged 2013.(could not tolerate simvastatin, no other statins tried)--<30% blockage bilat 07/2011   Chronic abdominal pain    Chronic fatigue and malaise    Claustrophobia    Cyst of left ovary    last imaging 06/2021, benign, no further f/u   Depression    treated in the past for years;stopped in 2010 for a years   Duodenal ulcer 1962   h/o   Dysrhythmia    ocassional PVC's, no current problems per patient on 04/02/19   Fibromyalgia    Frequent PVCs 07/2012   Seen by [facility]: benign, asymptomatic, normal EF   Gallstones 02/2021   on [place]   GERD (gastroesophageal reflux disease)    diet controlled   Glaucoma, narrow-angle    s/p laser surgery   History of hiatal hernia    during endoscopy   Hypothyroid 03/2007   IBS (irritable bowel syndrome)    Dr. Benson [place]   Ischemic colitis ([place]) 11/21/2018   no current problems per patient on 03/28/62   Lichenoid keratosis 87/56/4332   Dr.Stinehelfer   Ocular migraine    Osteoporosis 04/2010   Dr.Hawkes; later consulted Dr. Buddy Duty (2022)   Panic attack    Parkinson disease ([place])     Parkinson's disease ([place]) 06/23/2016   Recurrent UTI    has cystocele-Dr.Grewal   Shingles 1999   h/o   Superficial thrombophlebitis 03/2009   RLE   Trochanteric bursitis 12/2008   bilateral   Past Surgical History:  Procedure Laterality Date   ABDOMINAL HYSTERECTOMY     BACK SURGERY  05/2019   CATARACT EXTRACTION, BILATERAL  1995, 1996   EYE SURGERY Bilateral    laser - glaucoma   Flexible sigmoidoscopy     KYPHOPLASTY N/A 04/03/2019   Procedure: KYPHOPLASTY L1;  Surgeon: Melina Schools, MD;  Location: [facility];  Service: Orthopedics;  Laterality: N/A;  90 mins   THYROIDECTOMY, PARTIAL  09/2005   L nodule; Dr. Harlow Asa   TONSILLECTOMY  1946   UPPER GI ENDOSCOPY  06/27/2012   VAGINAL HYSTERECTOMY  1971   and bladder repair.  Still has ovaries   WISDOM TOOTH EXTRACTION     Patient Active Problem List   Diagnosis Date Noted   Chronic left-sided low back pain with left-sided sciatica 01/26/2022   Senile purpura ([place]) 11/04/2021   Calculus of gallbladder without cholecystitis without  obstruction 11/01/2021   Rash 03/18/2021   Herpes zoster without complication 23/53/6144   Multiple allergies 03/18/2021   Frail elderly 03/18/2021   LUQ pain 10/29/2019   Poor appetite 10/29/2019   Loss of weight 10/29/2019   Nausea and vomiting 10/29/2019   Altered bowel habits 10/29/2019   Lumbar compression fracture (HCC) 04/03/2019   Compression fracture of L1 lumbar vertebra (HCC) 03/16/2019   Osteoporosis of multiple sites 03/16/2019   Essential tremor 02/06/2019   Ischemic colitis ([place])    Leukocytosis    Acute colitis 11/22/2018   Colitis 11/22/2018   Acute lower UTI 11/22/2018   Acute cystitis without hematuria    Parkinson's disease ([place]) 10/24/2018   Gait disturbance 11/28/2017   Chronic lymphocytic thyroiditis 04/24/2017   Status post removal of thyroid nodule 04/24/2017   Depression with anxiety 03/16/2017   Subcutaneous nodules 03/16/2017   Aortic atherosclerosis ([place])  11/24/2016   Allergy to multiple antibiotics 10/07/2016   Seafood allergy, anaphylaxis, subsequent encounter 31/54/0086   Helicobacter pylori gastritis 10/07/2016   Fall 09/05/2016   Bloating 08/15/2016   Severe recurrent major depression without psychotic features ([place]) 04/13/2016    Class: Chronic   Rash and nonspecific skin eruption 03/01/2016   Leg pain, bilateral 03/01/2016   Functional dyspepsia 10/13/2015   Subacromial bursitis 03/02/2015   Resting tremor 02/01/2015   Epigastric fullness 10/12/2014   Early satiety 10/12/2014   Cystocele 12/30/2013   IBS (irritable bowel syndrome) 09/30/2013   GERD (gastroesophageal reflux disease) 06/27/2013   PVC's (premature ventricular contractions) 10/02/2012   Depressive disorder, not elsewhere classified 10/02/2012   Bradycardia 08/01/2012   Fatigue 08/01/2012   Anxiety state 05/13/2012   Osteopenia 05/13/2012   Hypothyroidism 05/13/2012    REFERRING DIAG:  M54.42,G89.29 (ICD-10-CM) - Chronic left-sided low back pain with left-sided sciatica  M51.36 (ICD-10-CM) - DDD (degenerative disc disease), lumbar  M16.12 (ICD-10-CM) - Primary osteoarthritis of left hip    THERAPY DIAG:  Other low back pain  Muscle weakness (generalized)  Pain in left hip  PERTINENT HISTORY: Parkinson's disease; L1 compression fracture 2020; osteoporosis; had nerve blocks in past and fell on left side; Osteoporosis;  fibromyalgia;  married 71 years; negative experience with previous PT  PRECAUTIONS: osteoporosis   SUBJECTIVE: Pt reports receiving the injectio last week and not having a good reaction to it. She has called MD office and they are aware. Pt sobbing for entire treatment. Pt was advised to call her therapist and talk with her.     PAIN:  Are you having pain Yes  NPRS scale:  6/10 Lt SI, 8/10 Left neck especially when turning LT. Pain location: Lt lumbar and gluteals  Pain orientation: Bilateral  PAIN TYPE: aching Pain description:  constant, sore Aggravating factors: when I sleep, using my cane, activity Relieving factors: laying down, ice   OBJECTIVE: from evaluation on 10/27/21   DIAGNOSTIC FINDINGS:  Not recently   PATIENT SURVEYS:  FOTO 45%  12/21/21:  40 (pt is in a flare-up)     01/23/22: 56              SENSATION: WFL to light touch    POSTURE:  Decreased lumbar lordosis, increased thoracic kyphosis; right shoulder elevated in sitting and standing with slight right shift;  hand tremor   PALPATION: Tender points in bil gluteals and lumbar paraspinals;  decreased thoraco lumbar fascial mobility   LUMBAR ROM:  Uses partial squat to pick up items from floor;  unable to tolerate supine lying  but OK with right sidelying    Active  A/PROM  10/27/21  Flexion Not tested secondary to osteoporosis  Extension 0  Right lateral flexion    Left lateral flexion    Right rotation    Left rotation     (Blank rows = not tested) Trunk strength: decreased trunk extensor strength 4/5 and decreased activation of lower abdominals  LE ROM:  WFLs, right and left knee pain with full extension LE MMT:  Able to rise from standard chair without UE assist    LUMBAR SPECIAL TESTS:  Slump test: Negative   GAIT: Distance walked: 300 feet Assistive device utilized: Single point cane Level of assistance: Complete Independence Comments: shorter step length   02/08/22:  Functional tests:  5x sit to stand: 16 seconds  3 minute walk test: 315 feet using cane   TODAY'S TREATMENT   03/13/22:  No treatment provided. Just hugs and tissues.   03/08/22:Pt arrives for aquatic physical therapy. Treatment took place in 3.5-5.5 feet of water. Water temperature was 91 degrees F. Pt entered the pool via stairs step to step with heavy use of the hand rails. Pt requires buoyancy of water for support and to offload joints with strengthening exercises and pain reduction.  Seated water bench with 75% submersion Pt performed seated LE AROM  exercises 20x in all planes, cervical rotation RT 5x slowly: very painful. This improved with 2 more sets . Seated at edge of bench with feet on the floor: shoulder flex/ext gentle 2x10, gentle breast stroke arms 1 min, then horizontal add/abd 2x10.  Water walking forward and backwards 2x with blue noodle for support: slow, small steps with close SBA. Manual P/ROM Bil toes and ankle with Lt hip and buttocks soft tissue work in water.      03/06/22: Nustep L1 x 32mn with PTA present to discuss current status.  Seated ball squeeze 2 sec hold 10x 1# hip to hip 10x, shoulder to shoulder 10x Cervical Rt rotation 5x in between sets   PTA suggested seeing if she can find a therapist that specializes in Geriatrics. Pt agreed and thought that was a good idea. She currently sees a general therapist.  Session ended early. PTA advised pt to go home and sleep/nap.        03/01/22: Pt arrives for aquatic physical therapy. Treatment took place in 3.5-5.5 feet of water. Water temperature was 91 degrees F. Pt entered the pool via stairs step to step with heavy use of the hand rails. Pt requires buoyancy of water for support and to offload joints with strengthening exercises and pain reduction.  Seated water bench with 75% submersion Pt performed seated LE AROM exercises 20x in all planes, cervical rotation RT 5x slowly: very painful. This improved with 2 more sets . Seated at edge of bench with feet on the floor: shoulder flex/ext gentle 2x10, gentle breast stroke arms 1 min, then horizontal add/abd 2x10.  Water walking forward and backwards 6x with blue noodle for support: slow, small steps with close SBA. Seated decompression with large noodle behind patient. PTA providing assistance to help hold noodle down so it didn't increase shoulder pain.  Intermittent LE AROM concurrent with 3x5 of cervical rotation to the RT.     02/27/22: Mini tramp: 3 way weight shifting x 1 min each; pain with weightshifting forward and  back in Lt SI Biceps curls 3# 2x10 bil each Seated marching: marching 2# added x20  Long arc quads: 2# added 2x10 Shoulder  flexion 1# 2x10: C/O Lt neck pain: shoulder rolls 20x post and Rt rotation for cervical 10x Seated hip abduction with blue band 2x10: complains of LT SI pain when doing the ex but nothing increased after.  NuStep: level  2x 10  minutes-PTA present to discuss progress Seated hamstring stretch 2x20 seconds    PATIENT EDUCATION:  Education details: [id number], aquatic info (11/22/21) Person educated: Patient Education method: Explanation Education comprehension: verbalized understanding     HOME EXERCISE PROGRAM: Access Code: [email or web address] URL: [email or web address] Date: 11/09/2021 Prepared by: Claiborne Billings  Exercises - Seated Hamstring Stretch  - 3 x daily - 7 x weekly - 1 sets - 3 reps - 20 hold - Seated Figure 4 Piriformis Stretch  - 1 x daily - 7 x weekly - 1 sets - 3 reps - 20 hold - Seated Hip Adduction Isometrics with Ball  - 2 x daily - 7 x weekly - 1 sets - 10 reps - 5 hold - Seated Hip Abduction with Resistance  - 2 x daily - 7 x weekly - 1 sets - 10 reps   ASSESSMENT:   CLINICAL IMPRESSION:  NO treatment was provided today as pt was sobbing and was hurting too much.   OBJECTIVE IMPAIRMENTS difficulty walking, decreased strength, increased fascial restrictions, and pain.     ACTIVITY LIMITATIONS cleaning, meal prep, and shopping.    PERSONAL FACTORS Age, Past/current experiences, Time since onset of injury/illness/exacerbation, and 1 comorbidity: Parkinson's, fibromyalgia, osteoporosis   are also affecting patient's functional outcome.      REHAB POTENTIAL: Good   CLINICAL DECISION MAKING: Stable/uncomplicated   EVALUATION COMPLEXITY: Low     GOALS: Goals reviewed with patient? Yes   SHORT TERM GOALS: Target date: 11/24/2021   The patient will demonstrate knowledge of basic self care strategies and exercises to promote healing and pain  relief Baseline: Goal status: MET   2.  The patient will report a 30% improvement in pain levels with functional activities which are currently difficult including standing and walking for shopping Baseline: 65% (01/23/22) Goal status: MET   3.  The patient will be able to walk in the grocery store 15 minutes with the shopping cart without major exacerbation of pain Baseline:  30 minutes (12/21/21) Goal status: MET       LONG TERM GOALS: Target date: 04/05/22   The patient will be independent in a safe self progression of a home exercise program to promote further recovery of function   Baseline:  Goal status: In progress    2.  The patient will report a 75% improvement in pain levels with functional activities which are currently difficult including standing and walking Baseline: 60-65% (01/23/22) Goal status: in progress    3.  The patient will be able to stand for light meal prep 8-10 min without exacerbation of pain Baseline: 25-30 min (02/08/22) Goal status: MET   4.   FOTO score improved from 45% to 52% indicating improved function with less pain Baseline: 56 (01/23/22) Goal status: MET    5.  Walk > or = to 30-45 minutes in the store without significant limitation Baseline: 30 min max and has increased pain and fatigue after  Goal Status: NEW  6. Walk > or = to 410 feet in 3 minutes to improve community distance Baseline: 315 feet Goal Status: NEW  7.  Perform sit to stand in < or = to 12 seconds to reduce falls risk. Baseline: 16 seconds  Goal Status: NEW  PLAN: PT FREQUENCY: 2x/week   PT DURATION: 8 weeks   PLANNED INTERVENTIONS: Therapeutic exercises, Therapeutic activity, Neuromuscular re-education, Balance training, Gait training, Patient/Family education, Joint mobilization, Aquatic Therapy, Dry Needling, Electrical stimulation, Moist heat, Taping, Ultrasound, Ionotophoresis 69m/ml Dexamethasone, and Manual therapy.   PLAN FOR NEXT SESSION:   2x/wk for  aquatics and land based exercise for endurance, flexibility and pain management  JMyrene Galas PTA 03/13/22 2:43 PM   [facility] [place] [place] [id number] # [phone] [phone] OUTPATIENT PHYSICAL THERAPY TREATMENT NOTE   Patient Name: Tracy BEIRNEMRN: [id number]:[birth year] 88y.o., female Today's Date: 03/13/2022  PCP: KRita Ohara MD REFERRING PROVIDER: JGlennon Mac DO    See note below for Objective Data and Assessment of Progress/Goals.       END OF SESSION:   PT End of Session - 03/13/22 1246     Visit Number 26    Date for PT Re-Evaluation 04/05/22    Authorization Type Medicare- KX at 15    Progress Note Due on Visit 29    PT Start Time 1227    PT Stop Time 1307    PT Time Calculation (min) 40 min    Behavior During Therapy Anxious   crying and very upset                                Past Medical History:  Diagnosis Date   Bell's palsy 1966   Hx: right side facial droop, resolved per patient 04/02/19   Car[place]   on vascular screening;unchanged 2013.(could not tolerate simvastatin, no other statins tried)--<30% blockage bilat 07/2011   Chronic abdominal pain    Chronic fatigue and malaise    Claustrophobia    Cyst of left ovary    last imaging 06/2021, benign, no further f/u   Depression    treated in the past for years;stopped in 2010 for a years   Duodenal ulcer 1962   h/o   Dysrhythmia    ocassional PVC's, no current problems per patient on 04/02/19   Fibromyalgia    Frequent PVCs 07/2012   Seen by [facility]: benign, asymptomatic, normal EF   Gallstones 02/2021   on [place]  GERD (gastroesophageal reflux disease)    diet controlled   Glaucoma, narrow-angle    s/p laser surgery   History of hiatal hernia    during endoscopy   Hypothyroid 03/2007   IBS (irritable bowel syndrome)    Dr. HBenson [place]  Ischemic colitis  ([place] 11/21/2018   no current problems per patient on 97/5/10  Lichenoid keratosis 025/85/2778  Dr.Stinehelfer   Ocular migraine    Osteoporosis 04/2010   Dr.Hawkes; later consulted Dr. KBuddy Duty(2022)   Panic attack    Parkinson disease ([place]    Parkinson's disease ([place] 06/23/2016   Recurrent UTI    has cystocele-Dr.Grewal   Shingles 1999   h/o   Superficial thrombophlebitis 03/2009   RLE   Trochanteric bursitis 12/2008   bilateral   Past Surgical History:  Procedure Laterality Date   ABDOMINAL HYSTERECTOMY     BACK SURGERY  05/2019   CATARACT EXTRACTION, BILATERAL  1995, 1996   EYE SURGERY Bilateral    laser - glaucoma   Flexible sigmoidoscopy     KYPHOPLASTY N/A 04/03/2019   Procedure: KYPHOPLASTY L1;  Surgeon: BMelina Schools MD;  Location:  [facility];  Service: Orthopedics;  Laterality: N/A;  90 mins   THYROIDECTOMY, PARTIAL  09/2005   L nodule; Dr. Harlow Asa   TONSILLECTOMY  1946   UPPER GI ENDOSCOPY  06/27/2012   VAGINAL HYSTERECTOMY  1971   and bladder repair.  Still has ovaries   WISDOM TOOTH EXTRACTION     Patient Active Problem List   Diagnosis Date Noted   Chronic left-sided low back pain with left-sided sciatica 01/26/2022   Senile purpura ([place]) 11/04/2021   Calculus of gallbladder without cholecystitis without obstruction 11/01/2021   Rash 03/18/2021   Herpes zoster without complication 23/55/7322   Multiple allergies 03/18/2021   Frail elderly 03/18/2021   LUQ pain 10/29/2019   Poor appetite 10/29/2019   Loss of weight 10/29/2019   Nausea and vomiting 10/29/2019   Altered bowel habits 10/29/2019   Lumbar compression fracture ([place]) 04/03/2019   Compression fracture of L1 lumbar vertebra ([place]) 03/16/2019   Osteoporosis of multiple sites 03/16/2019   Essential tremor 02/06/2019   Ischemic colitis ([place])    Leukocytosis    Acute colitis 11/22/2018   Colitis 11/22/2018   Acute lower UTI 11/22/2018   Acute cystitis without hematuria    Parkinson's disease  ([place]) 10/24/2018   Gait disturbance 11/28/2017   Chronic lymphocytic thyroiditis 04/24/2017   Status post removal of thyroid nodule 04/24/2017   Depression with anxiety 03/16/2017   Subcutaneous nodules 03/16/2017   Aortic atherosclerosis ([place]) 11/24/2016   Allergy to multiple antibiotics 10/07/2016   Seafood allergy, anaphylaxis, subsequent encounter 02/54/2706   Helicobacter pylori gastritis 10/07/2016   Fall 09/05/2016   Bloating 08/15/2016   Severe recurrent major depression without psychotic features ([place]) 04/13/2016    Class: Chronic   Rash and nonspecific skin eruption 03/01/2016   Leg pain, bilateral 03/01/2016   Functional dyspepsia 10/13/2015   Subacromial bursitis 03/02/2015   Resting tremor 02/01/2015   Epigastric fullness 10/12/2014   Early satiety 10/12/2014   Cystocele 12/30/2013   IBS (irritable bowel syndrome) 09/30/2013   GERD (gastroesophageal reflux disease) 06/27/2013   PVC's (premature ventricular contractions) 10/02/2012   Depressive disorder, not elsewhere classified 10/02/2012   Bradycardia 08/01/2012   Fatigue 08/01/2012   Anxiety state 05/13/2012   Osteopenia 05/13/2012   Hypothyroidism 05/13/2012    REFERRING DIAG:  M54.42,G89.29 (ICD-10-CM) - Chronic left-sided low back pain with left-sided sciatica  M51.36 (ICD-10-CM) - DDD (degenerative disc disease), lumbar  M16.12 (ICD-10-CM) - Primary osteoarthritis of left hip    THERAPY DIAG:  Other low back pain  Muscle weakness (generalized)  Pain in left hip  PERTINENT HISTORY: Parkinson's disease; L1 compression fracture 2020; osteoporosis; had nerve blocks in past and fell on left side; Osteoporosis;  fibromyalgia;  married 44 years; negative experience with previous PT  PRECAUTIONS: osteoporosis   SUBJECTIVE: Pt got both hips injected last week. Pt reports not having a positive effect. She called the MD office. She was told to go to the ED if she felt worse. Pt does not feel she needs to go  to the ED. Pt did tolerate last aquatics. Pt could not stop crying throughout session.    PAIN:  Are you having pain Yes  NPRS scale:  6/10 Lt SI, 8/10 Left neck especially when turning LT. Pain location: Lt lumbar and gluteals  Pain orientation: Bilateral  PAIN TYPE: aching Pain description: constant, sore Aggravating factors: when I sleep, using my cane, activity Relieving factors: laying down, ice   OBJECTIVE: from evaluation on 10/27/21   DIAGNOSTIC  FINDINGS:  Not recently   PATIENT SURVEYS:  FOTO 45%  12/21/21:  40 (pt is in a flare-up)     01/23/22: 56              SENSATION: WFL to light touch    POSTURE:  Decreased lumbar lordosis, increased thoracic kyphosis; right shoulder elevated in sitting and standing with slight right shift;  hand tremor   PALPATION: Tender points in bil gluteals and lumbar paraspinals;  decreased thoraco lumbar fascial mobility   LUMBAR ROM:  Uses partial squat to pick up items from floor;  unable to tolerate supine lying but OK with right sidelying    Active  A/PROM  10/27/21  Flexion Not tested secondary to osteoporosis  Extension 0  Right lateral flexion    Left lateral flexion    Right rotation    Left rotation     (Blank rows = not tested) Trunk strength: decreased trunk extensor strength 4/5 and decreased activation of lower abdominals  LE ROM:  WFLs, right and left knee pain with full extension LE MMT:  Able to rise from standard chair without UE assist    LUMBAR SPECIAL TESTS:  Slump test: Negative   GAIT: Distance walked: 300 feet Assistive device utilized: Single point cane Level of assistance: Complete Independence Comments: shorter step length   02/08/22:  Functional tests:  5x sit to stand: 16 seconds  3 minute walk test: 315 feet using cane   TODAY'S TREATMENT   03/13/22: No treatment. Pt in too much pain and could not stop crying.    03/08/22:Pt arrives for aquatic physical therapy. Treatment took place in 3.5-5.5  feet of water. Water temperature was 91 degrees F. Pt entered the pool via stairs step to step with heavy use of the hand rails. Pt requires buoyancy of water for support and to offload joints with strengthening exercises and pain reduction.  Seated water bench with 75% submersion Pt performed seated LE AROM exercises 20x in all planes, cervical rotation RT 5x slowly: very painful. This improved with 2 more sets . Seated at edge of bench with feet on the floor: shoulder flex/ext gentle 2x10, gentle breast stroke arms 1 min, then horizontal add/abd 2x10.  Water walking forward and backwards 2x with blue noodle for support: slow, small steps with close SBA. Manual P/ROM Bil toes and ankle with Lt hip and buttocks soft tissue work in water.      03/06/22: Nustep L1 x 43mn with PTA present to discuss current status.  Seated ball squeeze 2 sec hold 10x 1# hip to hip 10x, shoulder to shoulder 10x Cervical Rt rotation 5x in between sets   PTA suggested seeing if she can find a therapist that specializes in Geriatrics. Pt agreed and thought that was a good idea. She currently sees a general therapist.  Session ended early. PTA advised pt to go home and sleep/nap.        03/01/22: Pt arrives for aquatic physical therapy. Treatment took place in 3.5-5.5 feet of water. Water temperature was 91 degrees F. Pt entered the pool via stairs step to step with heavy use of the hand rails. Pt requires buoyancy of water for support and to offload joints with strengthening exercises and pain reduction.  Seated water bench with 75% submersion Pt performed seated LE AROM exercises 20x in all planes, cervical rotation RT 5x slowly: very painful. This improved with 2 more sets . Seated at edge of bench with feet on the floor:  shoulder flex/ext gentle 2x10, gentle breast stroke arms 1 min, then horizontal add/abd 2x10.  Water walking forward and backwards 6x with blue noodle for support: slow, small steps with close SBA. Seated  decompression with large noodle behind patient. PTA providing assistance to help hold noodle down so it didn't increase shoulder pain.  Intermittent LE AROM concurrent with 3x5 of cervical rotation to the RT.     02/27/22: Mini tramp: 3 way weight shifting x 1 min each; pain with weightshifting forward and back in Lt SI Biceps curls 3# 2x10 bil each Seated marching: marching 2# added x20  Long arc quads: 2# added 2x10 Shoulder flexion 1# 2x10: C/O Lt neck pain: shoulder rolls 20x post and Rt rotation for cervical 10x Seated hip abduction with blue band 2x10: complains of LT SI pain when doing the ex but nothing increased after.  NuStep: level  2x 10  minutes-PTA present to discuss progress Seated hamstring stretch 2x20 seconds   02/13/22:   Mini tramp: 3 way weight shifting x 1 min each Biceps curls 3# 2x10 bil each Seated marching: marching 2# added x20  Long arc quads: 2# added 2x10 Shoulder flexion 1# 2x10 Seated hip abduction with blue band 2x10 NuStep: level  2x 10  minutes-PT present to discuss progress Seated hamstring stretch 2x20 seconds    PATIENT EDUCATION:  Education details: [id number], aquatic info (11/22/21) Person educated: Patient Education method: Explanation Education comprehension: verbalized understanding     HOME EXERCISE PROGRAM: Access Code: [email or web address] URL: [email or web address] Date: 11/09/2021 Prepared by: Claiborne Billings  Exercises - Seated Hamstring Stretch  - 3 x daily - 7 x weekly - 1 sets - 3 reps - 20 hold - Seated Figure 4 Piriformis Stretch  - 1 x daily - 7 x weekly - 1 sets - 3 reps - 20 hold - Seated Hip Adduction Isometrics with Ball  - 2 x daily - 7 x weekly - 1 sets - 10 reps - 5 hold - Seated Hip Abduction with Resistance  - 2 x daily - 7 x weekly - 1 sets - 10 reps   ASSESSMENT:   CLINICAL IMPRESSION: Pt arrives in slightly less pain than she was on Monday but spirits remain low. One episode of teariness. Pt will receive an epidural  shot next week. Pian limited treatment.   OBJECTIVE IMPAIRMENTS difficulty walking, decreased strength, increased fascial restrictions, and pain.     ACTIVITY LIMITATIONS cleaning, meal prep, and shopping.    PERSONAL FACTORS Age, Past/current experiences, Time since onset of injury/illness/exacerbation, and 1 comorbidity: Parkinson's, fibromyalgia, osteoporosis   are also affecting patient's functional outcome.      REHAB POTENTIAL: Good   CLINICAL DECISION MAKING: Stable/uncomplicated   EVALUATION COMPLEXITY: Low     GOALS: Goals reviewed with patient? Yes   SHORT TERM GOALS: Target date: 11/24/2021   The patient will demonstrate knowledge of basic self care strategies and exercises to promote healing and pain relief Baseline: Goal status: MET   2.  The patient will report a 30% improvement in pain levels with functional activities which are currently difficult including standing and walking for shopping Baseline: 65% (01/23/22) Goal status: MET   3.  The patient will be able to walk in the grocery store 15 minutes with the shopping cart without major exacerbation of pain Baseline:  30 minutes (12/21/21) Goal status: MET       LONG TERM GOALS: Target date: 04/05/22   The patient will be independent in a safe self progression  of a home exercise program to promote further recovery of function   Baseline:  Goal status: In progress    2.  The patient will report a 75% improvement in pain levels with functional activities which are currently difficult including standing and walking Baseline: 60-65% (01/23/22) Goal status: in progress    3.  The patient will be able to stand for light meal prep 8-10 min without exacerbation of pain Baseline: 25-30 min (02/08/22) Goal status: MET   4.   FOTO score improved from 45% to 52% indicating improved function with less pain Baseline: 56 (01/23/22) Goal status: MET    5.  Walk > or = to 30-45 minutes in the store without significant  limitation Baseline: 30 min max and has increased pain and fatigue after  Goal Status: NEW  6. Walk > or = to 410 feet in 3 minutes to improve community distance Baseline: 315 feet Goal Status: NEW  7.  Perform sit to stand in < or = to 12 seconds to reduce falls risk. Baseline: 16 seconds  Goal Status: NEW     PLAN: PT FREQUENCY: 2x/week   PT DURATION: 8 weeks   PLANNED INTERVENTIONS: Therapeutic exercises, Therapeutic activity, Neuromuscular re-education, Balance training, Gait training, Patient/Family education, Joint mobilization, Aquatic Therapy, Dry Needling, Electrical stimulation, Moist heat, Taping, Ultrasound, Ionotophoresis 79m/ml Dexamethasone, and Manual therapy.   PLAN FOR NEXT SESSION:   Aquatics next  JMyrene Galas PDelaware08/21/23 2:43 PM   [id number] [place] [place] [id number] # [phone] [phone]

## 2022-03-15 ENCOUNTER — Ambulatory Visit: Payer: Medicare Other | Admitting: Physical Therapy

## 2022-03-20 ENCOUNTER — Ambulatory Visit: Payer: Medicare Other

## 2022-03-20 ENCOUNTER — Telehealth: Payer: Self-pay | Admitting: Adult Health

## 2022-03-20 ENCOUNTER — Ambulatory Visit (INDEPENDENT_AMBULATORY_CARE_PROVIDER_SITE_OTHER): Payer: Medicare Other | Admitting: Family Medicine

## 2022-03-20 ENCOUNTER — Encounter: Payer: Self-pay | Admitting: Family Medicine

## 2022-03-20 VITALS — BP 124/74 | HR 68 | Ht 59.0 in | Wt 93.8 lb

## 2022-03-20 DIAGNOSIS — E46 Unspecified protein-calorie malnutrition: Secondary | ICD-10-CM | POA: Diagnosis not present

## 2022-03-20 DIAGNOSIS — M25552 Pain in left hip: Secondary | ICD-10-CM

## 2022-03-20 DIAGNOSIS — G20 Parkinson's disease: Secondary | ICD-10-CM | POA: Diagnosis not present

## 2022-03-20 DIAGNOSIS — M5459 Other low back pain: Secondary | ICD-10-CM | POA: Diagnosis not present

## 2022-03-20 DIAGNOSIS — F332 Major depressive disorder, recurrent severe without psychotic features: Secondary | ICD-10-CM

## 2022-03-20 DIAGNOSIS — M6281 Muscle weakness (generalized): Secondary | ICD-10-CM

## 2022-03-20 NOTE — Progress Notes (Signed)
Chief Complaint  Patient presents with   Consult    Patient states she went to PT this am and she had to leave because she could not "keep it together." Feeling sad today.    Patient presents with crying/depression, accompanied by her husband. Pt states she couldn't stop crying at therapy--not related to pain, but from sadness/depression.  PT note reports that her treatment was limited due to agitation and anxiety. Referred to PT by sports med (Dr. Glennon Mac).  She has been seeing Dr. Glennon Mac for her chronic L low back pain with sciatica and lumbar DDD. On 8/17 she got depo medrol '40mg'$  and toradol '30mg'$  She developed nausea, abdominal pain and diarrhea Plan is for L sided L5-S1 epidural injection. This is scheduled for Friday, though patient may cancel it.   Trying to find her a geriatric PT, per notes.  Apparently there wasn't any kind of trigger for her episode today.  She has been feeling very sad, down. There was some concern that her prozac was causing her to have abdominal pain. She didn't take her prozac today. No other missed doses. She feels like it is making her worse, and doesn't want to take it anymore. Last dose of xanax 11:30 was this morning.  Her last visit with me was in April, at which time her moods were better, and she was also on fluoxetine at that time.  She was reminded that she has chronic issues with abdominal pain.  She denies any significant bloating or pain today.  She reports excessive worrying, worries about her son in [place], afraid that she will never see him again.  Her husband reports that they speak weekly, and he is doing okay. She hasn't had an appetite, doesn't want to eat or drink anything. Has lost weight. She worries about her Parkinson's.  She is under the care of [facility], and has an appt 9/5. Last saw therapist Rinaldo Cloud on 8/14, next appt 9/12.  PMH, PSH, SH reviewed  Outpatient Encounter Medications as of 03/20/2022  Medication Sig Note    ALPRAZolam (XANAX) 0.5 MG tablet Take four tablets daily as needed for anxiety. 03/20/2022: Took one at 11:30   b complex vitamins tablet Take 1 tablet by mouth daily. 06/09/2021: Will take this afternoon   Calcium Citrate-Vitamin D (CALCIUM CITRATE +D PO) Take 1 tablet by mouth in the morning and at bedtime. Taking 400 mg of calcium and 500 units of vitamin D twice daily (total 800 mg calcium and 1000 units of vitamin D)    Carbidopa-Levodopa ER (SINEMET CR) 25-100 MG tablet controlled release TAKE ONE TABLET BY MOUTH EVERY MORNING; 1 TABLET AT NOON; AND 1 TABLET AT BEDTIME (Patient taking differently: 1 tablet in the morning, at noon, in the evening, and at bedtime.)    Probiotic Product (ALIGN PO) Take 1 capsule by mouth daily.    SYNTHROID 25 MCG tablet TAKE 1 TABLET DAILY BEFORE BREAKFAST FOR HYPOTHYROIDISM 10/06/2021: She takes 1 daily Monday through Saturday, skipping Sunday's dose.   dicyclomine (BENTYL) 10 MG capsule Take 1 capsule (10 mg total) by mouth every 6 (six) hours as needed. (Patient not taking: Reported on 03/20/2022) 10/06/2021: As needed   FLUoxetine (PROZAC) 10 MG capsule Take 1 capsule (10 mg total) by mouth 2 (two) times daily. (Patient not taking: Reported on 03/20/2022) 03/20/2022: Did not take today   No facility-administered encounter medications on file as of 03/20/2022.   Allergies  Allergen Reactions   Iodinated Contrast Media Anaphylaxis  Iodine Anaphylaxis    IV and topical forms. Other reaction(s): Unknown   Levsin [Hyoscyamine Sulfate]     Vision problems/pt has glaucoma   Salmon [Fish Allergy] Hives and Shortness Of Breath   Shellfish Allergy Anaphylaxis   Tramadol Swelling   Remeron [Mirtazapine] Other (See Comments)    Cause blurred vision and red eyes, pt has glaucoma   Aspirin Other (See Comments)    Sever stomach pain due to ulcer scaring.   Bis Subcit-Metronid-Tetracyc Swelling    Tongue swelling. Face tingling Other reaction(s): Unknown   Cephalexin  Hives    Other reaction(s): hives   Ciprofloxacin Diarrhea   Codeine Nausea And Vomiting   Cyclobenzaprine Other (See Comments)    Tingly/prickly sensation. Other reaction(s): tingly/prickly sensation   Darvocet [Propoxyphene N-Acetaminophen] Nausea And Vomiting   Demerol [Meperidine] Nausea Only   Dexlansoprazole Swelling    Redness, swelling and peeling of both feet. Other reaction(s): foot pain   Diphedryl [Diphenhydramine] Other (See Comments)    Increased pulse/small amount ok   Doxycycline Hyclate Other (See Comments)    GI intolerance.   Doxycycline Hyclate     Other reaction(s): GI intolerance   Epinephrine Other (See Comments)    Breathing problems Other reaction(s): breathing problems/fainting   Erythromycin Other (See Comments)    GI intolerance. Other reaction(s): GI   Fish Oil     Other reaction(s): breathing problems/hives   Hyoscyamine     Other reaction(s): eye pain   Latex Other (See Comments)    Gloves ok.  Skin gets red from elastic in underwear and latex bandaides.   Meperidine Hcl     Other reaction(s): vomiting   Nitrofurantoin Diarrhea   Other     Other reaction(s): migraines Other reaction(s): Unknown Other reaction(s): Unknown Other reaction(s): Unknown Other reaction(s): increased pulse, faint, diarrhea   Prednisone Other (See Comments)    Headache Other reaction(s): headache   Ra Diphedryl Allergy [Diphenhydramine Hcl]     Other reaction(s): increased pulse small dose okay   Sertraline Hcl Swelling and Other (See Comments)    Migraine Swelling of tongue/lip (09/2012) Other reaction(s): Unknown   Shellfish-Derived Products     Other reaction(s): Unknown   Sulfa Antibiotics Other (See Comments)    Increased pulse, fainting, diarrhea, thrush   Wellbutrin [Bupropion] Other (See Comments)    Headaches   Xylocaine [Lidocaine Hcl]     With epinephrine, given by dentist.  Speeded up heart rate and she passed out (occured twice, at dentist)    Xylocaine [Lidocaine]     Other reaction(s): Unknown   Biaxin [Clarithromycin] Rash    Started after completing 10 day course of 2000 mg /day, Lips swelling   Ibuprofen Other (See Comments)    Motrin ok with a GI effect. Other reaction(s): rash Motrin okay with a GI effect   ROS:  no fever, chills, no current urinary problems, no significant abdominal pain currently.  No chest pain, shortness of breath. +depression, anxiety/worry, tremor/Parkinsons. Decreased appetite, weight loss.  No n/v/d.   PHYSICAL EXAM:  BP 124/74   Pulse 68   Ht '4\' 11"'$  (1.499 m)   Wt 93 lb 12.8 oz (42.5 kg)   LMP  (LMP Unknown)   BMI 18.95 kg/m   Wt Readings from Last 3 Encounters:  03/20/22 93 lb 12.8 oz (42.5 kg)  03/07/22 95 lb (43.1 kg)  01/30/22 97 lb (44 kg)   Thin, frail elderly female, crying for the first half of the visit. She was  then able to stop crying, listened well. Husband very supportive during visit. Hand tremors noted. Normal gait. Depressed mood/affect, +anxious. Normal hygiene and grooming     03/20/2022    3:08 PM 11/02/2021    9:59 AM 03/24/2021   11:31 AM 09/08/2020    9:54 AM 09/08/2020    9:47 AM  Depression screen PHQ 2/9  Decreased Interest '3 3 3 3 3  '$ Down, Depressed, Hopeless '3 3 3 3 3  '$ PHQ - 2 Score '6 6 6 6 6  '$ Altered sleeping '2 3 2 '$ 0   Tired, decreased energy '3 3 3 '$ 0   Change in appetite '3 3 1 1   '$ Feeling bad or failure about yourself  '3 3 3 '$ 0   Trouble concentrating 2 0 1 0   Moving slowly or fidgety/restless 0 0 1 0   Suicidal thoughts 0 0 0 0   PHQ-9 Score '19 18 17 7   '$ Difficult doing work/chores Not difficult at all Somewhat difficult Somewhat difficult Somewhat difficult     ASSESSMENT/PLAN:  Severe recurrent major depression without psychotic features ([place]) - f/u with psych and therapist. Counseled today  Protein-calorie malnutrition, unspecified severity ([place]) - encouraged her to eat/drink, protein supplements/shakes, if needed. Discussed importance of  eating even if no appetite.  Parkinson disease ([place])  Encouraged her to eat/drink regardless of hunger--to think of food as fuel, and is needed to keep her energy up and feel better.  Encouraged her to think of things she is grateful for (we have discussed gratitude journals in the past)--including her strong heart, lungs, overall good health (other than PD and depression/anxiety), and her memory being intact, no dementia associated with her PD, her supportive family, the ability to talk to her son weekly (even if unable to see/visit), and that he is reportedly doing well. She worries so much about him--discussed the importance of her taking care of herself because he likely also worries a lot about her, and trying to stay healthy (by eating, and continuing to seek treatment) for if/when they are able to see each other.  Discussed setting an intention for the day--something to focus on and something to let go of. Discussed distractions from her "pity party"--find things that bring her joy/laughter. She has been doing this by watching Frasier. Also discussed music, podcasts, books.  Husband was asking if she should stop vs increase her Prozac. I advised that this recommendation should come from her psychiatrist, who knows her prior medications better. I encouraged her to continue the prozac--had been helping her previously, and I do NOT think any abdominal pain is from the medication.  We discussed that this may be a somatic complaint related to her anxiety/depression. Briefly suggested possible remeron as anti-depressant that could help with appetite. She recalls trying this in the past. Will leave her psych treatment decisions to her psych.  Patient was feeling much better at the end of the visit.  I spent 45 minutes dedicated to the care of this patient, including pre-visit review of records, face to face time, post-visit ordering of testing and documentation.

## 2022-03-20 NOTE — Telephone Encounter (Signed)
Next appt is 03/28/22. Tracy Malone's [name] called regarding her Prozac. It is causing her abdominal pain. He wants to know if there are some more alternatives that she can take? The phone number is [phone]. Pharmacy is:  [id number] - [address] [place]  Phone:  [phone]  Fax:  [phone]

## 2022-03-20 NOTE — Therapy (Signed)
OUTPATIENT PHYSICAL THERAPY TREATMENT NOTE   Patient Name: Tracy Malone MRN: [id number] DOB:[birth year], 88 y.o., female Today's Date: 03/20/2022  PCP: [name], MD REFERRING PROVIDER: Glennon Mac, DO    See note below for Objective Data and Assessment of Progress/Goals.       END OF SESSION:   PT End of Session - 03/20/22 1227     Visit Number 27    PT Start Time 0511    PT Stop Time 1220    PT Time Calculation (min) 34 min    Activity Tolerance Treatment limited secondary to agitation    Behavior During Therapy Anxious                                  Past Medical History:  Diagnosis Date   Bell's palsy 1966   Hx: right side facial droop, resolved per patient 04/02/19   Carotid artery disease ([place]) 2010   on vascular screening;unchanged 2013.(could not tolerate simvastatin, no other statins tried)--<30% blockage bilat 07/2011   Chronic abdominal pain    Chronic fatigue and malaise    Claustrophobia    Cyst of left ovary    last imaging 06/2021, benign, no further f/u   Depression    treated in the past for years;stopped in 2010 for a years   Duodenal ulcer 1962   h/o   Dysrhythmia    ocassional PVC's, no current problems per patient on 04/02/19   Fibromyalgia    Frequent PVCs 07/2012   Seen by [facility]: benign, asymptomatic, normal EF   Gallstones 02/2021   on [place]   GERD (gastroesophageal reflux disease)    diet controlled   Glaucoma, narrow-angle    s/p laser surgery   History of hiatal hernia    during endoscopy   Hypothyroid 03/2007   IBS (irritable bowel syndrome)    Dr. Benson Norway   Ischemic colitis ([place]) 11/21/2018   no current problems per patient on 0/2/11   Lichenoid keratosis 17/35/6701   Dr.Stinehelfer   Ocular migraine    Osteoporosis 04/2010   Dr.Hawkes; later consulted Dr. Buddy Duty (2022)   Panic attack    Parkinson disease ([place])    Parkinson's disease ([place]) 06/23/2016   Recurrent UTI    has  cystocele-Dr.Grewal   Shingles 1999   h/o   Superficial thrombophlebitis 03/2009   RLE   Trochanteric bursitis 12/2008   bilateral   Past Surgical History:  Procedure Laterality Date   ABDOMINAL HYSTERECTOMY     BACK SURGERY  05/2019   CATARACT EXTRACTION, BILATERAL  1995, 1996   EYE SURGERY Bilateral    laser - glaucoma   Flexible sigmoidoscopy     KYPHOPLASTY N/A 04/03/2019   Procedure: KYPHOPLASTY L1;  Surgeon: Melina Schools, MD;  Location: [facility];  Service: Orthopedics;  Laterality: N/A;  90 mins   THYROIDECTOMY, PARTIAL  09/2005   L nodule; Dr. Harlow Asa   TONSILLECTOMY  1946   UPPER GI ENDOSCOPY  06/27/2012   VAGINAL HYSTERECTOMY  1971   and bladder repair.  Still has ovaries   WISDOM TOOTH EXTRACTION     Patient Active Problem List   Diagnosis Date Noted   Chronic left-sided low back pain with left-sided sciatica 01/26/2022   Senile purpura ([place]) 11/04/2021   Calculus of gallbladder without cholecystitis without obstruction 11/01/2021   Rash 03/18/2021   Herpes zoster without complication 41/09/129   Multiple allergies 03/18/2021  Frail elderly 03/18/2021   LUQ pain 10/29/2019   Poor appetite 10/29/2019   Loss of weight 10/29/2019   Nausea and vomiting 10/29/2019   Altered bowel habits 10/29/2019   Lumbar compression fracture ([place]) 04/03/2019   Compression fracture of L1 lumbar vertebra ([facility]) 03/16/2019   Osteoporosis of multiple sites 03/16/2019   Essential tremor 02/06/2019   Ischemic colitis ([place])    Leukocytosis    Acute colitis 11/22/2018   Colitis 11/22/2018   Acute lower UTI 11/22/2018   Acute cystitis without hematuria    Parkinson's disease ([place]) 10/24/2018   Gait disturbance 11/28/2017   Chronic lymphocytic thyroiditis 04/24/2017   Status post removal of thyroid nodule 04/24/2017   Depression with anxiety 03/16/2017   Subcutaneous nodules 03/16/2017   Aortic atherosclerosis ([place]) 11/24/2016   Allergy to multiple antibiotics 10/07/2016    Seafood allergy, anaphylaxis, subsequent encounter 01/75/1025   Helicobacter pylori gastritis 10/07/2016   Fall 09/05/2016   Bloating 08/15/2016   Severe recurrent major depression without psychotic features ([place]) 04/13/2016    Class: Chronic   Rash and nonspecific skin eruption 03/01/2016   Leg pain, bilateral 03/01/2016   Functional dyspepsia 10/13/2015   Subacromial bursitis 03/02/2015   Resting tremor 02/01/2015   Epigastric fullness 10/12/2014   Early satiety 10/12/2014   Cystocele 12/30/2013   IBS (irritable bowel syndrome) 09/30/2013   GERD (gastroesophageal reflux disease) 06/27/2013   PVC's (premature ventricular contractions) 10/02/2012   Depressive disorder, not elsewhere classified 10/02/2012   Bradycardia 08/01/2012   Fatigue 08/01/2012   Anxiety state 05/13/2012   Osteopenia 05/13/2012   Hypothyroidism 05/13/2012    REFERRING DIAG:  M54.42,G89.29 (ICD-10-CM) - Chronic left-sided low back pain with left-sided sciatica  M51.36 (ICD-10-CM) - DDD (degenerative disc disease), lumbar  M16.12 (ICD-10-CM) - Primary osteoarthritis of left hip    THERAPY DIAG:  Other low back pain  Muscle weakness (generalized)  Pain in left hip  PERTINENT HISTORY: Parkinson's disease; L1 compression fracture 2020; osteoporosis; had nerve blocks in past and fell on left side; Osteoporosis;  fibromyalgia;  married 65 years; negative experience with previous PT  PRECAUTIONS: osteoporosis   SUBJECTIVE: Pt reports receiving the injectio last week and not having a good reaction to it. She has called MD office and they are aware. Pt sobbing for entire treatment. Pt was advised to call her therapist and talk with her.     PAIN:  Are you having pain Yes  NPRS scale:  6/10 Lt SI, 8/10 Left neck especially when turning LT. Pain location: Lt lumbar and gluteals  Pain orientation: Bilateral  PAIN TYPE: aching Pain description: constant, sore Aggravating factors: when I sleep, using my  cane, activity Relieving factors: laying down, ice   OBJECTIVE: from evaluation on 10/27/21   DIAGNOSTIC FINDINGS:  Not recently   PATIENT SURVEYS:  FOTO 45%  12/21/21:  40 (pt is in a flare-up)     01/23/22: 56              SENSATION: WFL to light touch    POSTURE:  Decreased lumbar lordosis, increased thoracic kyphosis; right shoulder elevated in sitting and standing with slight right shift;  hand tremor   PALPATION: Tender points in bil gluteals and lumbar paraspinals;  decreased thoraco lumbar fascial mobility   LUMBAR ROM:  Uses partial squat to pick up items from floor;  unable to tolerate supine lying but OK with right sidelying    Active  A/PROM  10/27/21  Flexion Not tested secondary to osteoporosis  Extension 0  Right lateral flexion    Left lateral flexion    Right rotation    Left rotation     (Blank rows = not tested) Trunk strength: decreased trunk extensor strength 4/5 and decreased activation of lower abdominals  LE ROM:  WFLs, right and left knee pain with full extension LE MMT:  Able to rise from standard chair without UE assist    LUMBAR SPECIAL TESTS:  Slump test: Negative   GAIT: Distance walked: 300 feet Assistive device utilized: Single point cane Level of assistance: Complete Independence Comments: shorter step length   02/08/22:  Functional tests:  5x sit to stand: 16 seconds  3 minute walk test: 315 feet using cane   TODAY'S TREATMENT   03/13/22:  No treatment provided. Just hugs and tissues.   03/08/22:Pt arrives for aquatic physical therapy. Treatment took place in 3.5-5.5 feet of water. Water temperature was 91 degrees F. Pt entered the pool via stairs step to step with heavy use of the hand rails. Pt requires buoyancy of water for support and to offload joints with strengthening exercises and pain reduction.  Seated water bench with 75% submersion Pt performed seated LE AROM exercises 20x in all planes, cervical rotation RT 5x slowly: very  painful. This improved with 2 more sets . Seated at edge of bench with feet on the floor: shoulder flex/ext gentle 2x10, gentle breast stroke arms 1 min, then horizontal add/abd 2x10.  Water walking forward and backwards 2x with blue noodle for support: slow, small steps with close SBA. Manual P/ROM Bil toes and ankle with Lt hip and buttocks soft tissue work in water.      03/06/22: Nustep L1 x 27mn with PTA present to discuss current status.  Seated ball squeeze 2 sec hold 10x 1# hip to hip 10x, shoulder to shoulder 10x Cervical Rt rotation 5x in between sets   PTA suggested seeing if she can find a therapist that specializes in Geriatrics. Pt agreed and thought that was a good idea. She currently sees a general therapist.  Session ended early. PTA advised pt to go home and sleep/nap.        03/01/22: Pt arrives for aquatic physical therapy. Treatment took place in 3.5-5.5 feet of water. Water temperature was 91 degrees F. Pt entered the pool via stairs step to step with heavy use of the hand rails. Pt requires buoyancy of water for support and to offload joints with strengthening exercises and pain reduction.  Seated water bench with 75% submersion Pt performed seated LE AROM exercises 20x in all planes, cervical rotation RT 5x slowly: very painful. This improved with 2 more sets . Seated at edge of bench with feet on the floor: shoulder flex/ext gentle 2x10, gentle breast stroke arms 1 min, then horizontal add/abd 2x10.  Water walking forward and backwards 6x with blue noodle for support: slow, small steps with close SBA. Seated decompression with large noodle behind patient. PTA providing assistance to help hold noodle down so it didn't increase shoulder pain.  Intermittent LE AROM concurrent with 3x5 of cervical rotation to the RT.     02/27/22: Mini tramp: 3 way weight shifting x 1 min each; pain with weightshifting forward and back in Lt SI Biceps curls 3# 2x10 bil each Seated marching:  marching 2# added x20  Long arc quads: 2# added 2x10 Shoulder flexion 1# 2x10: C/O Lt neck pain: shoulder rolls 20x post and Rt rotation for cervical 10x Seated hip abduction with  blue band 2x10: complains of LT SI pain when doing the ex but nothing increased after.  NuStep: level  2x 10  minutes-PTA present to discuss progress Seated hamstring stretch 2x20 seconds    PATIENT EDUCATION:  Education details: [id number], aquatic info (11/22/21) Person educated: Patient Education method: Explanation Education comprehension: verbalized understanding     HOME EXERCISE PROGRAM: Access Code: [email or web address] URL: [email or web address] Date: 11/09/2021 Prepared by: Claiborne Billings  Exercises - Seated Hamstring Stretch  - 3 x daily - 7 x weekly - 1 sets - 3 reps - 20 hold - Seated Figure 4 Piriformis Stretch  - 1 x daily - 7 x weekly - 1 sets - 3 reps - 20 hold - Seated Hip Adduction Isometrics with Ball  - 2 x daily - 7 x weekly - 1 sets - 10 reps - 5 hold - Seated Hip Abduction with Resistance  - 2 x daily - 7 x weekly - 1 sets - 10 reps   ASSESSMENT:   CLINICAL IMPRESSION:  NO treatment was provided today as pt was sobbing and was hurting too much.   OBJECTIVE IMPAIRMENTS difficulty walking, decreased strength, increased fascial restrictions, and pain.     ACTIVITY LIMITATIONS cleaning, meal prep, and shopping.    PERSONAL FACTORS Age, Past/current experiences, Time since onset of injury/illness/exacerbation, and 1 comorbidity: Parkinson's, fibromyalgia, osteoporosis   are also affecting patient's functional outcome.      REHAB POTENTIAL: Good   CLINICAL DECISION MAKING: Stable/uncomplicated   EVALUATION COMPLEXITY: Low     GOALS: Goals reviewed with patient? Yes   SHORT TERM GOALS: Target date: 11/24/2021   The patient will demonstrate knowledge of basic self care strategies and exercises to promote healing and pain relief Baseline: Goal status: MET   2.  The patient will  report a 30% improvement in pain levels with functional activities which are currently difficult including standing and walking for shopping Baseline: 65% (01/23/22) Goal status: MET   3.  The patient will be able to walk in the grocery store 15 minutes with the shopping cart without major exacerbation of pain Baseline:  30 minutes (12/21/21) Goal status: MET       LONG TERM GOALS: Target date: 04/05/22   The patient will be independent in a safe self progression of a home exercise program to promote further recovery of function   Baseline:  Goal status: In progress    2.  The patient will report a 75% improvement in pain levels with functional activities which are currently difficult including standing and walking Baseline: 60-65% (01/23/22) Goal status: in progress    3.  The patient will be able to stand for light meal prep 8-10 min without exacerbation of pain Baseline: 25-30 min (02/08/22) Goal status: MET   4.   FOTO score improved from 45% to 52% indicating improved function with less pain Baseline: 56 (01/23/22) Goal status: MET    5.  Walk > or = to 30-45 minutes in the store without significant limitation Baseline: 30 min max and has increased pain and fatigue after  Goal Status: NEW  6. Walk > or = to 410 feet in 3 minutes to improve community distance Baseline: 315 feet Goal Status: NEW  7.  Perform sit to stand in < or = to 12 seconds to reduce falls risk. Baseline: 16 seconds  Goal Status: NEW     PLAN: PT FREQUENCY: 2x/week   PT DURATION: 8 weeks   PLANNED INTERVENTIONS: Therapeutic exercises, Therapeutic activity, Neuromuscular  re-education, Balance training, Gait training, Patient/Family education, Joint mobilization, Aquatic Therapy, Dry Needling, Electrical stimulation, Moist heat, Taping, Ultrasound, Ionotophoresis 70m/ml Dexamethasone, and Manual therapy.   PLAN FOR NEXT SESSION:   2x/wk for aquatics and land based exercise for endurance, flexibility and  pain management  JMyrene Galas PTA 03/20/22 12:28 PM   [id number] [place] [id number] # [phone] [phone] OUTPATIENT PHYSICAL THERAPY TREATMENT NOTE   Patient Name: Tracy GERGENMRN: [id number]:[birth year] 88y.o., female Today's Date: 03/20/2022  PCP: KRita Ohara MD REFERRING PROVIDER: JGlennon Mac DO    See note below for Objective Data and Assessment of Progress/Goals.       END OF SESSION:   PT End of Session - 03/20/22 1227     Visit Number 27    PT Start Time 17062   PT Stop Time 1220    PT Time Calculation (min) 34 min    Activity Tolerance Treatment limited secondary to agitation    Behavior During Therapy Anxious                                  Past Medical History:  Diagnosis Date   Bell's palsy 1966   Hx: right side facial droop, resolved per patient 04/02/19   [place]   on vascular screening;unchanged 2013.(could not tolerate simvastatin, no other statins tried)--<30% blockage bilat 07/2011   Chronic abdominal pain    Chronic fatigue and malaise    Claustrophobia    Cyst of left ovary    last imaging 06/2021, benign, no further f/u   Depression    treated in the past for years;stopped in 2010 for a years   Duodenal ulcer 1962   h/o   Dysrhythmia    ocassional PVC's, no current problems per patient on 04/02/19   Fibromyalgia    Frequent PVCs 07/2012   Seen by [facility]: benign, asymptomatic, normal EF   Gallstones 02/2021   on [place]  GERD (gastroesophageal reflux disease)    diet controlled   Glaucoma, narrow-angle    s/p laser surgery   History of hiatal hernia    during endoscopy   Hypothyroid 03/2007   IBS (irritable bowel syndrome)    Dr. HBenson [place]  Ischemic colitis ([place] 11/21/2018   no current problems per patient on 93/7/62  Lichenoid keratosis 083/15/1761  Dr.Stinehelfer   Ocular migraine     Osteoporosis 04/2010   Dr.Hawkes; later consulted Dr. KBuddy Duty(2022)   Panic attack    Parkinson disease ([place]    [place]inson's disease ([place] 06/23/2016   Recurrent UTI    has cystocele-Dr.Grewal   Shingles 1999   h/o   Superficial thrombophlebitis 03/2009   RLE   Trochanteric bursitis 12/2008   bilateral   Past Surgical History:  Procedure Laterality Date   ABDOMINAL HYSTERECTOMY     BACK SURGERY  05/2019   CATARACT EXTRACTION, BILATERAL  1995, 1996   EYE SURGERY Bilateral    laser - glaucoma   Flexible sigmoidoscopy     KYPHOPLASTY N/A 04/03/2019   Procedure: KYPHOPLASTY L1;  Surgeon: BMelina Schools MD;  [facility]  Service: Orthopedics;  Laterality: N/A;  90 mins   THYROIDECTOMY, PARTIAL  09/2005   L nodule; Dr. GHarlow Asa  TONSILLECTOMY  1946   UPPER GI ENDOSCOPY  06/27/2012   VAGINAL HYSTERECTOMY  1971   and bladder repair.  Still has ovaries   WISDOM TOOTH EXTRACTION     Patient Active Problem List   Diagnosis Date Noted   Chronic left-sided low back pain with left-sided sciatica 01/26/2022   Senile purpura ([place]) 11/04/2021   Calculus of gallbladder without cholecystitis without obstruction 11/01/2021   Rash 03/18/2021   Herpes zoster without complication 93/57/0177   Multiple allergies 03/18/2021   Frail elderly 03/18/2021   LUQ pain 10/29/2019   Poor appetite 10/29/2019   Loss of weight 10/29/2019   Nausea and vomiting 10/29/2019   Altered bowel habits 10/29/2019   Lumbar compression fracture ([place]) 04/03/2019   Compression fracture of L1 lumbar vertebra ([place]) 03/16/2019   Osteoporosis of multiple sites 03/16/2019   Essential tremor 02/06/2019   Ischemic colitis ([place])    Leukocytosis    Acute colitis 11/22/2018   Colitis 11/22/2018   Acute lower UTI 11/22/2018   Acute cystitis without hematuria    Parkinson's disease ([place]) 10/24/2018   Gait disturbance 11/28/2017   Chronic lymphocytic thyroiditis 04/24/2017   Status post removal of thyroid nodule  04/24/2017   Depression with anxiety 03/16/2017   Subcutaneous nodules 03/16/2017   Aortic atherosclerosis ([place]) 11/24/2016   Allergy to multiple antibiotics 10/07/2016   Seafood allergy, anaphylaxis, subsequent encounter 93/90/3009   Helicobacter pylori gastritis 10/07/2016   Fall 09/05/2016   Bloating 08/15/2016   Severe recurrent major depression without psychotic features ([place]) 04/13/2016    Class: Chronic   Rash and nonspecific skin eruption 03/01/2016   Leg pain, bilateral 03/01/2016   Functional dyspepsia 10/13/2015   Subacromial bursitis 03/02/2015   Resting tremor 02/01/2015   Epigastric fullness 10/12/2014   Early satiety 10/12/2014   Cystocele 12/30/2013   IBS (irritable bowel syndrome) 09/30/2013   GERD (gastroesophageal reflux disease) 06/27/2013   PVC's (premature ventricular contractions) 10/02/2012   Depressive disorder, not elsewhere classified 10/02/2012   Bradycardia 08/01/2012   Fatigue 08/01/2012   Anxiety state 05/13/2012   Osteopenia 05/13/2012   Hypothyroidism 05/13/2012    REFERRING DIAG:  M54.42,G89.29 (ICD-10-CM) - Chronic left-sided low back pain with left-sided sciatica  M51.36 (ICD-10-CM) - DDD (degenerative disc disease), lumbar  M16.12 (ICD-10-CM) - Primary osteoarthritis of left hip    THERAPY DIAG:  Other low back pain  Muscle weakness (generalized)  Pain in left hip  PERTINENT HISTORY: Parkinson's disease; L1 compression fracture 2020; osteoporosis; had nerve blocks in past and fell on left side; Osteoporosis;  fibromyalgia;  married 53 years; negative experience with previous PT  PRECAUTIONS: osteoporosis   SUBJECTIVE: I just feel so tired and I cry every time I come here.     PAIN:  No pain reported today.   OBJECTIVE: from evaluation on 10/27/21   DIAGNOSTIC FINDINGS:  Not recently   PATIENT SURVEYS:  FOTO 45%  12/21/21:  40 (pt is in a flare-up)     01/23/22: 56              SENSATION: WFL to light touch    POSTURE:   Decreased lumbar lordosis, increased thoracic kyphosis; right shoulder elevated in sitting and standing with slight right shift;  hand tremor   PALPATION: Tender points in bil gluteals and lumbar paraspinals;  decreased thoraco lumbar fascial mobility   LUMBAR ROM:  Uses partial squat to pick up items from floor;  unable to tolerate supine lying but OK with right sidelying    Active  A/PROM  10/27/21  Flexion Not tested secondary to osteoporosis  Extension 0  Right lateral flexion    Left lateral flexion    Right rotation    Left rotation     (Blank rows = not tested) Trunk strength: decreased trunk extensor strength 4/5 and decreased activation of lower abdominals  LE ROM:  WFLs, right and left knee pain with full extension LE MMT:  Able to rise from standard chair without UE assist    LUMBAR SPECIAL TESTS:  Slump test: Negative   GAIT: Distance walked: 300 feet Assistive device utilized: Single point cane Level of assistance: Complete Independence Comments: shorter step length   02/08/22:  Functional tests:  5x sit to stand: 16 seconds  3 minute walk test: 315 feet using cane   TODAY'S TREATMENT  03/20/22:  Pt arrived crying today and session spent counseling the patient and her husband regarding next steps.   Pt advised to discuss her physical complaints with [name], MD as she has appt on 03/23/22.  Her list of complaints are loss of appetite, fatigue, weakness and hair loss.   Pt will follow-up with the MD that prescribes her psychiatric medications due to recent changes in her mood.   Pt advised to continue with movement as able- walk, do HEP, get fresh air, deep breathing to help with stress levels. Pt's husband was a part of this conversation and both verbally agreed.    03/13/22: No treatment. Pt in too much pain and could not stop crying.    03/08/22:Pt arrives for aquatic physical therapy. Treatment took place in 3.5-5.5 feet of water. Water temperature was 91  degrees F. Pt entered the pool via stairs step to step with heavy use of the hand rails. Pt requires buoyancy of water for support and to offload joints with strengthening exercises and pain reduction.  Seated water bench with 75% submersion Pt performed seated LE AROM exercises 20x in all planes, cervical rotation RT 5x slowly: very painful. This improved with 2 more sets . Seated at edge of bench with feet on the floor: shoulder flex/ext gentle 2x10, gentle breast stroke arms 1 min, then horizontal add/abd 2x10.  Water walking forward and backwards 2x with blue noodle for support: slow, small steps with close SBA. Manual P/ROM Bil toes and ankle with Lt hip and buttocks soft tissue work in water.      03/06/22: Nustep L1 x 49mn with PTA present to discuss current status.  Seated ball squeeze 2 sec hold 10x 1# hip to hip 10x, shoulder to shoulder 10x Cervical Rt rotation 5x in between sets   PTA suggested seeing if she can find a therapist that specializes in Geriatrics. Pt agreed and thought that was a good idea. She currently sees a general therapist.  Session ended early. PTA advised pt to go home and sleep/nap.        03/01/22: Pt arrives for aquatic physical therapy. Treatment took place in 3.5-5.5 feet of water. Water temperature was 91 degrees F. Pt entered the pool via stairs step to step with heavy use of the hand rails. Pt requires buoyancy of water for support and to offload joints with strengthening exercises and pain reduction.  Seated water bench with 75% submersion Pt performed seated LE AROM exercises 20x in all planes, cervical rotation RT 5x slowly: very painful. This improved with 2 more sets . Seated at edge of bench with feet on the floor: shoulder flex/ext gentle 2x10, gentle breast stroke arms 1 min, then horizontal add/abd 2x10.  Water walking forward and  backwards 6x with blue noodle for support: slow, small steps with close SBA. Seated decompression with large noodle behind  patient. PTA providing assistance to help hold noodle down so it didn't increase shoulder pain.  Intermittent LE AROM concurrent with 3x5 of cervical rotation to the RT.      PATIENT EDUCATION:  Education details: [id number], aquatic info (11/22/21) Person educated: Patient Education method: Explanation Education comprehension: verbalized understanding     HOME EXERCISE PROGRAM: Access Code: [email or web address] URL: [email or web address] Date: 11/09/2021 Prepared by: Claiborne Billings  Exercises - Seated Hamstring Stretch  - 3 x daily - 7 x weekly - 1 sets - 3 reps - 20 hold - Seated Figure 4 Piriformis Stretch  - 1 x daily - 7 x weekly - 1 sets - 3 reps - 20 hold - Seated Hip Adduction Isometrics with Ball  - 2 x daily - 7 x weekly - 1 sets - 10 reps - 5 hold - Seated Hip Abduction with Resistance  - 2 x daily - 7 x weekly - 1 sets - 10 reps   ASSESSMENT:   CLINICAL IMPRESSION: Pt will D/C today.  Counseling session by PT as documented above.  Recent changes in her mood have impacted her ability to participate in PT.  She will follow-up with her MDs regarding these recent changes.    OBJECTIVE IMPAIRMENTS difficulty walking, decreased strength, increased fascial restrictions, and pain.       GOALS: Goals reviewed with patient? Yes   SHORT TERM GOALS: Target date: 11/24/2021   The patient will demonstrate knowledge of basic self care strategies and exercises to promote healing and pain relief Baseline: Goal status: MET   2.  The patient will report a 30% improvement in pain levels with functional activities which are currently difficult including standing and walking for shopping Baseline: 65% (01/23/22) Goal status: MET   3.  The patient will be able to walk in the grocery store 15 minutes with the shopping cart without major exacerbation of pain Baseline:  30 minutes (12/21/21) Goal status: MET       LONG TERM GOALS: Target date: 04/05/22   The patient will be independent in a safe  self progression of a home exercise program to promote further recovery of function   Baseline:  Goal status: In progress    2.  The patient will report a 75% improvement in pain levels with functional activities which are currently difficult including standing and walking Baseline: 60-65% (01/23/22) Goal status:Not met    3.  The patient will be able to stand for light meal prep 8-10 min without exacerbation of pain Baseline: 25-30 min (02/08/22) Goal status: MET   4.   FOTO score improved from 45% to 52% indicating improved function with less pain Baseline: 56 (01/23/22) Goal status: MET    5.  Walk > or = to 30-45 minutes in the store without significant limitation Baseline: 30 min max and has increased pain and fatigue after  Goal Status: NEW  6. Walk > or = to 410 feet in 3 minutes to improve community distance Baseline: 315 feet Goal Status: NEW  7.  Perform sit to stand in < or = to 12 seconds to reduce falls risk. Baseline: 16 seconds  Goal Status: NEW     PLAN:  D/C PT to HEP  PHYSICAL THERAPY DISCHARGE SUMMARY  Visits from Start of Care: 27  Current functional level related to goals / functional outcomes: See above for current PT status.  Pt has had a change in her physical and mental health status and has instructions to follow-up with her GP and psychiatric  MD   Remaining deficits: Reduced mobility, weakness and pain associated with chronic condition.    Education / Equipment: HEP   Patient agrees to discharge. Patient goals were partially met. Patient is being discharged due to a change in medical status.  Sigurd Sos, PT 03/20/22 12:28 PM  [facility] [address] [place], [place] [address] Phone # [phone] Fax [phone]

## 2022-03-20 NOTE — Patient Instructions (Signed)
Please follow-up with [facility] as scheduled. Your next visit with Rinaldo Cloud isn't until 9/12--you may want to contact her to see if you can be seen sooner.  Continue your current dose of prozac. You should discuss medication treatment options with her, and your concerns about your medications and depression.  Today we discussed some things to be grateful for (and to think of these things daily)--your strong heart, your strong mind (great memory, no dementia), good lungs and overall physically good health.  Your supportive husband, daughter, and son who loves you, and that you get to speak to weekly (even if you can't see him).  Do not spend your days worrying about things you have no control over and cannot change.  Find things that bring you joy or make you smile. Continue to watch Frasier and other things that make you laugh. We discussed the use of calming music, and maybe even listening to podcasts (I've been enjoying Gregary Signs Louis-Dreyfus' "Wiser Than Me").  We discussed the importance of eating and drinking to keep your strength and health up, even if you don't feel hungry or thirsty. Remember that food is your "fuel"--you need it whether you enjoy it or not.  It is fine to eat small amounts throughout the day, if you aren't able to eat large amounts at once.    Please know to reach out (to your psychiatrist, therapist, me, calling [phone] or your family) if you cannot trust yourself to stay safe.  We are all here for you.

## 2022-03-21 ENCOUNTER — Ambulatory Visit: Payer: Medicare Other | Admitting: Psychiatry

## 2022-03-21 NOTE — Telephone Encounter (Signed)
Pt wants to go back to 10 mg until appt.She wants to know if she can take it every other day.She stated she still has the stomach problems with the 10 mg.

## 2022-03-21 NOTE — Telephone Encounter (Signed)
That's fine

## 2022-03-21 NOTE — Telephone Encounter (Signed)
Pt informed

## 2022-03-22 ENCOUNTER — Ambulatory Visit: Payer: Medicare Other | Admitting: Physical Therapy

## 2022-03-22 ENCOUNTER — Telehealth (HOSPITAL_COMMUNITY): Payer: Self-pay

## 2022-03-22 NOTE — Telephone Encounter (Signed)
Returned pt's call. She would like to cancel her epidural injection for this Friday. She wants to see Dr. Glennon Mac first and she will call back when she is ready to reschedule. AW

## 2022-03-23 ENCOUNTER — Other Ambulatory Visit: Payer: Self-pay | Admitting: Gastroenterology

## 2022-03-23 ENCOUNTER — Ambulatory Visit: Payer: Medicare Other | Admitting: Family Medicine

## 2022-03-24 ENCOUNTER — Ambulatory Visit (HOSPITAL_COMMUNITY): Payer: Medicare Other

## 2022-03-28 ENCOUNTER — Encounter: Payer: Self-pay | Admitting: Adult Health

## 2022-03-28 ENCOUNTER — Ambulatory Visit (INDEPENDENT_AMBULATORY_CARE_PROVIDER_SITE_OTHER): Payer: Medicare Other | Admitting: Adult Health

## 2022-03-28 DIAGNOSIS — F411 Generalized anxiety disorder: Secondary | ICD-10-CM | POA: Diagnosis not present

## 2022-03-28 DIAGNOSIS — F331 Major depressive disorder, recurrent, moderate: Secondary | ICD-10-CM | POA: Diagnosis not present

## 2022-03-28 DIAGNOSIS — F410 Panic disorder [episodic paroxysmal anxiety] without agoraphobia: Secondary | ICD-10-CM

## 2022-03-28 DIAGNOSIS — G4700 Insomnia, unspecified: Secondary | ICD-10-CM

## 2022-03-28 MED ORDER — MIRTAZAPINE 15 MG PO TABS: 15.0000 mg | ORAL_TABLET | Freq: Every day | ORAL | 2 refills | Status: DC

## 2022-03-28 NOTE — Progress Notes (Signed)
CORINTHIAN KEMLER [id number] May 16, 1932 88 y.o.  Subjective:   Patient ID:  Tracy Malone is a 88 y.o. (DOB 1931/11/12) female.  Chief Complaint: No chief complaint on file.   HPI Tracy Malone presents to the office today for follow-up of MDD, GAD, panic attacks and insomnia.  Accompanied by daughter.  Describes mood today as "not good". Pleasant. Tearful throughout interview. Mood symptoms - reports depression and anxiety. Increased irritability at times. Reports worry and rumination. Denies recent panic attacks. Does not feel like the Prozac is working for her - having issues with stomach. Willing to consider other options.Decreased interest and motivation. Taking medications as prescribed. Seeing therapist - Rinaldo Cloud. Energy levels lower. Using a rolling walker. Active, does not have a regular exercise routine.  Enjoys some usual interests and activities. Married. Lives with husband - 2 grown children. Spending time with family. Appetite decreased. Not eating well - has lost weight, now at 92 pounds. Sleeping difficulties. Averages 5 hours. Reports some daytime napping. Focus and concentration stable. Completing tasks. Managing aspects of household. Retired.  Denies SI or HI.  Denies AH or VH. Denies self harm. Denies substance use.  Previous medication trials: Multiple medication trials   PHQ2-9    [place] Office Visit from 03/20/2022 in [place] Visit from 11/02/2021 in [place] Visit from 03/24/2021 in [place] Visit from 09/08/2020 in [place] Visit from 09/03/2019 in [place]  PHQ-2 Total Score '6 6 6 6 6  '$ PHQ-9 Total Score '19 18 17 7 17      '$ Flowsheet Row ED from 03/07/2021 in [facility]  C-SSRS RISK CATEGORY No Risk        Review of Systems:  Review of Systems  Musculoskeletal:  Negative for gait problem.  Neurological:   Negative for tremors.  Psychiatric/Behavioral:         Please refer to HPI    Medications: I have reviewed the patient's current medications.  Current Outpatient Medications  Medication Sig Dispense Refill   mirtazapine (REMERON) 15 MG tablet Take 1 tablet (15 mg total) by mouth at bedtime. 30 tablet 2   ALPRAZolam (XANAX) 0.5 MG tablet Take four tablets daily as needed for anxiety. 120 tablet 2   b complex vitamins tablet Take 1 tablet by mouth daily.     Calcium Citrate-Vitamin D (CALCIUM CITRATE +D PO) Take 1 tablet by mouth in the morning and at bedtime. Taking 400 mg of calcium and 500 units of vitamin D twice daily (total 800 mg calcium and 1000 units of vitamin D)     Carbidopa-Levodopa ER (SINEMET CR) 25-100 MG tablet controlled release TAKE ONE TABLET BY MOUTH EVERY MORNING; 1 TABLET AT NOON; AND 1 TABLET AT BEDTIME (Patient taking differently: 1 tablet in the morning, at noon, in the evening, and at bedtime.) 270 tablet 1   dicyclomine (BENTYL) 10 MG capsule TAKE 1 CAPSULE BY MOUTH EVERY 6 HOURS AS NEEDED 30 capsule 6   FLUoxetine (PROZAC) 10 MG capsule Take 1 capsule (10 mg total) by mouth 2 (two) times daily. (Patient not taking: Reported on 03/20/2022) 60 capsule 5   Probiotic Product (ALIGN PO) Take 1 capsule by mouth daily.     SYNTHROID 25 MCG tablet TAKE 1 TABLET DAILY BEFORE BREAKFAST FOR HYPOTHYROIDISM 90 tablet 0   No current facility-administered medications for this visit.    Medication Side Effects: None  Allergies:  Allergies  Allergen Reactions   Iodinated Contrast Media Anaphylaxis   Iodine Anaphylaxis    IV and topical forms. Other reaction(s): Unknown   Levsin [Hyoscyamine Sulfate]     Vision problems/pt has glaucoma   Salmon [Fish Allergy] Hives and Shortness Of Breath   Shellfish Allergy Anaphylaxis   Tramadol Swelling   Remeron [Mirtazapine] Other (See Comments)    Cause blurred vision and red eyes, pt has glaucoma   Aspirin Other (See Comments)     Sever stomach pain due to ulcer scaring.   Bis Subcit-Metronid-Tetracyc Swelling    Tongue swelling. Face tingling Other reaction(s): Unknown   Cephalexin Hives    Other reaction(s): hives   Ciprofloxacin Diarrhea   Codeine Nausea And Vomiting   Cyclobenzaprine Other (See Comments)    Tingly/prickly sensation. Other reaction(s): tingly/prickly sensation   Darvocet [Propoxyphene N-Acetaminophen] Nausea And Vomiting   Demerol [Meperidine] Nausea Only   Dexlansoprazole Swelling    Redness, swelling and peeling of both feet. Other reaction(s): foot pain   Diphedryl [Diphenhydramine] Other (See Comments)    Increased pulse/small amount ok   Doxycycline Hyclate Other (See Comments)    GI intolerance.   Doxycycline Hyclate     Other reaction(s): GI intolerance   Epinephrine Other (See Comments)    Breathing problems Other reaction(s): breathing problems/fainting   Erythromycin Other (See Comments)    GI intolerance. Other reaction(s): GI   Fish Oil     Other reaction(s): breathing problems/hives   Hyoscyamine     Other reaction(s): eye pain   Latex Other (See Comments)    Gloves ok.  Skin gets red from elastic in underwear and latex bandaides.   Meperidine Hcl     Other reaction(s): vomiting   Nitrofurantoin Diarrhea   Other     Other reaction(s): migraines Other reaction(s): Unknown Other reaction(s): Unknown Other reaction(s): Unknown Other reaction(s): increased pulse, faint, diarrhea   Prednisone Other (See Comments)    Headache Other reaction(s): headache   Ra Diphedryl Allergy [Diphenhydramine Hcl]     Other reaction(s): increased pulse small dose okay   Sertraline Hcl Swelling and Other (See Comments)    Migraine Swelling of tongue/lip (09/2012) Other reaction(s): Unknown   Shellfish-Derived Products     Other reaction(s): Unknown   Sulfa Antibiotics Other (See Comments)    Increased pulse, fainting, diarrhea, thrush   Wellbutrin [Bupropion] Other (See Comments)     Headaches   Xylocaine [Lidocaine Hcl]     With epinephrine, given by dentist.  Speeded up heart rate and she passed out (occured twice, at dentist)   Xylocaine [Lidocaine]     Other reaction(s): Unknown   Biaxin [Clarithromycin] Rash    Started after completing 10 day course of 2000 mg /day, Lips swelling   Ibuprofen Other (See Comments)    Motrin ok with a GI effect. Other reaction(s): rash Motrin okay with a GI effect    Past Medical History:  Diagnosis Date   Bell's palsy 1966   Hx: right side facial droop, resolved per patient 04/02/19   Carotid artery disease ([place]) 2010   on vascular screening;unchanged 2013.(could not tolerate simvastatin, no other statins tried)--<30% blockage bilat 07/2011   Chronic abdominal pain    Chronic fatigue and malaise    Claustrophobia    Cyst of left ovary    last imaging 06/2021, benign, no further f/u   Depression    treated in the past for years;stopped in 2010 for a years   Duodenal ulcer 1962  h/o   Dysrhythmia    ocassional PVC's, no current problems per patient on 04/02/19   Fibromyalgia    Frequent PVCs 07/2012   Seen by  Cards: benign, asymptomatic, normal EF   Gallstones 02/2021   on [place]   GERD (gastroesophageal reflux disease)    diet controlled   Glaucoma, narrow-angle    s/p laser surgery   History of hiatal hernia    during endoscopy   Hypothyroid 03/2007   IBS (irritable bowel syndrome)    Dr. Benson [place]   Ischemic colitis ([place]) 11/21/2018   no current problems per patient on 03/29/28   Lichenoid keratosis 52/84/1324   Dr.Stinehelfer   Ocular migraine    Osteoporosis 04/2010   Dr.Hawkes; later consulted Dr. Buddy Duty (2022)   Panic attack    Parkinson disease ([place])    Parkinson's disease ([place]) 06/23/2016   Recurrent UTI    has cystocele-Dr.Grewal   Shingles 1999   h/o   Superficial thrombophlebitis 03/2009   RLE   Trochanteric bursitis 12/2008   bilateral    Past Medical History, Surgical history, Social  history, and Family history were reviewed and updated as appropriate.   Please see review of systems for further details on the patient's review from today.   Objective:   Physical Exam:  LMP  (LMP Unknown)   Physical Exam Constitutional:      General: She is not in acute distress. Musculoskeletal:        General: No deformity.  Neurological:     Mental Status: She is alert and oriented to person, place, and time.     Coordination: Coordination normal.  Psychiatric:        Attention and Perception: Attention and perception normal. She does not perceive auditory or visual hallucinations.        Mood and Affect: Mood normal. Mood is not anxious or depressed. Affect is not labile, blunt, angry or inappropriate.        Speech: Speech normal.        Behavior: Behavior normal.        Thought Content: Thought content normal. Thought content is not paranoid or delusional. Thought content does not include homicidal or suicidal ideation. Thought content does not include homicidal or suicidal plan.        Cognition and Memory: Cognition and memory normal.        Judgment: Judgment normal.     Comments: Insight intact     Lab Review:     Component Value Date/Time   NA 142 11/02/2021 1117   K 4.1 11/02/2021 1117   CL 104 11/02/2021 1117   CO2 26 11/02/2021 1117   GLUCOSE 89 11/02/2021 1117   GLUCOSE 147 (H) 09/23/2021 1318   BUN 12 11/02/2021 1117   CREATININE 0.64 11/02/2021 1117   CREATININE 0.75 10/22/2018 1401   CREATININE 0.67 11/23/2016 0815   CALCIUM 10.0 11/02/2021 1117   PROT 6.4 11/02/2021 1117   ALBUMIN 4.4 11/02/2021 1117   AST 23 11/02/2021 1117   AST 17 10/22/2018 1401   ALT 4 11/02/2021 1117   ALT 7 10/22/2018 1401   ALKPHOS 69 11/02/2021 1117   BILITOT 1.3 (H) 11/02/2021 1117   BILITOT 0.8 10/22/2018 1401   GFRNONAA >60 03/07/2021 0755   GFRNONAA >60 10/22/2018 1401   GFRAA >60 03/16/2020 1153   GFRAA >60 10/22/2018 1401       Component Value Date/Time    WBC 6.1 09/23/2021 1318   RBC 4.21 09/23/2021 1318  HGB 12.8 09/23/2021 1318   HGB 13.4 03/10/2021 1547   HCT 38.5 09/23/2021 1318   HCT 39.5 03/10/2021 1547   PLT 261.0 09/23/2021 1318   PLT 266 03/10/2021 1547   MCV 91.5 09/23/2021 1318   MCV 89 03/10/2021 1547   MCH 30.1 03/10/2021 1547   MCH 30.4 03/07/2021 0755   MCHC 33.3 09/23/2021 1318   RDW 13.8 09/23/2021 1318   RDW 12.2 03/10/2021 1547   LYMPHSABS 1.8 09/23/2021 1318   LYMPHSABS 1.4 03/10/2021 1547   MONOABS 0.5 09/23/2021 1318   EOSABS 0.1 09/23/2021 1318   EOSABS 0.1 03/10/2021 1547   BASOSABS 0.1 09/23/2021 1318   BASOSABS 0.0 03/10/2021 1547    No results found for: "POCLITH", "LITHIUM"   No results found for: "PHENYTOIN", "PHENOBARB", "VALPROATE", "CBMZ"   .res Assessment: Plan:    Plan:  PDMP reviewed  1. Xanax 0.'5mg'$  - 2 x daily prn anxiety - usually undertakes 2. D/C Prozac '10mg'$  daily - stomach issues 3. Add Remeron '15mg'$  at hs  RTC 3 months  Patient advised to contact office with any questions, adverse effects, or acute worsening in signs and symptoms.  Discussed potential benefits, risk, and side effects of benzodiazepines to include potential risk of tolerance and dependence, as well as possible drowsiness.  Advised patient not to drive if experiencing drowsiness and to take lowest possible effective dose to minimize risk of dependence and tolerance. Diagnoses and all orders for this visit:  Insomnia, unspecified type -     mirtazapine (REMERON) 15 MG tablet; Take 1 tablet (15 mg total) by mouth at bedtime.  Generalized anxiety disorder -     mirtazapine (REMERON) 15 MG tablet; Take 1 tablet (15 mg total) by mouth at bedtime.  Major depressive disorder, recurrent episode, moderate (HCC) -     mirtazapine (REMERON) 15 MG tablet; Take 1 tablet (15 mg total) by mouth at bedtime.  Panic attacks     Please see After Visit Summary for patient specific instructions.  Future Appointments   Date Time Provider [place]  04/04/2022  3:00 PM Shanon Ace, LCSW CP-CP None  04/07/2022 12:00 PM Marijo Conception, FNP ACP-ACP None  04/17/2022  1:00 PM Shanon Ace, LCSW CP-CP None  07/04/2022  3:30 PM Sater, Nanine Means, MD [facility] None    No orders of the defined types were placed in this encounter.   -------------------------------

## 2022-03-29 ENCOUNTER — Ambulatory Visit: Payer: Medicare Other | Admitting: Family Medicine

## 2022-03-29 ENCOUNTER — Encounter: Payer: Self-pay | Admitting: Internal Medicine

## 2022-03-29 ENCOUNTER — Ambulatory Visit: Payer: Medicare Other | Admitting: Physical Therapy

## 2022-03-30 ENCOUNTER — Encounter: Payer: Self-pay | Admitting: Family Medicine

## 2022-03-30 ENCOUNTER — Ambulatory Visit (INDEPENDENT_AMBULATORY_CARE_PROVIDER_SITE_OTHER): Payer: Medicare Other | Admitting: Family Medicine

## 2022-03-30 VITALS — BP 134/70 | HR 72 | Temp 97.7°F | Ht 59.0 in | Wt 91.6 lb

## 2022-03-30 DIAGNOSIS — K1379 Other lesions of oral mucosa: Secondary | ICD-10-CM | POA: Diagnosis not present

## 2022-03-30 DIAGNOSIS — F332 Major depressive disorder, recurrent severe without psychotic features: Secondary | ICD-10-CM | POA: Diagnosis not present

## 2022-03-30 NOTE — Progress Notes (Signed)
Chief Complaint  Patient presents with   Mouth Lesions    Patient woke up Tuesday with sores in her mouth, roof of her mouth that are irritating. Tongue feels swollen as well. She states she had shingles in her mouth years ago when she lived in [place] and she was concerned.Her stomach is bothering her. Had some diarrhea last night.    2 days ago she woke up with sores on the upper left palate. Nothing on gums. Yesterday she noted the tongue feeling swollen/ridges on the left side. Lips feel swollen on the left side--upper and lower.   Husband denies them looking swollen.  No injury/trauma. No change to diet/food Denies trouble swallowing or breathing.  Feels uncomfortable all the time, but denies pain. She is able to eat on the other side of her mouth.  Denies that this is painful.  She continues to be very depressed.  She didn't want to continue with prozac, hasn't taken.  Saw psych, who prescribed remeron, but she hasn't started it yet. She is concerned because she saw that she wasn't supposed to take xanax with it.  She reports decreased appetite. She had some diarrhea last night, used bentyl.  Today she has some abdominal discomfort, and feels "swollen" in her stomach,  PMH, PSH, SH reviewed Hasn't had B12 level checked, but gets it in her B complex  Outpatient Encounter Medications as of 03/30/2022  Medication Sig Note   dicyclomine (BENTYL) 10 MG capsule TAKE 1 CAPSULE BY MOUTH EVERY 6 HOURS AS NEEDED 03/30/2022: Took one last night   SYNTHROID 25 MCG tablet TAKE 1 TABLET DAILY BEFORE BREAKFAST FOR HYPOTHYROIDISM 10/06/2021: She takes 1 daily Monday through Saturday, skipping Sunday's dose.   ALPRAZolam (XANAX) 0.5 MG tablet Take four tablets daily as needed for anxiety. (Patient not taking: Reported on 03/30/2022) 03/30/2022: Has not taken today   b complex vitamins tablet Take 1 tablet by mouth daily. (Patient not taking: Reported on 03/30/2022) 06/09/2021: Will take this afternoon    Calcium Citrate-Vitamin D (CALCIUM CITRATE +D PO) Take 1 tablet by mouth in the morning and at bedtime. Taking 400 mg of calcium and 500 units of vitamin D twice daily (total 800 mg calcium and 1000 units of vitamin D) (Patient not taking: Reported on 03/30/2022) 03/30/2022: Will take later this afternoon   Carbidopa-Levodopa ER (SINEMET CR) 25-100 MG tablet controlled release TAKE ONE TABLET BY MOUTH EVERY MORNING; 1 TABLET AT NOON; AND 1 TABLET AT BEDTIME (Patient not taking: Reported on 03/30/2022) 03/30/2022: Will take later this afternoon    mirtazapine (REMERON) 15 MG tablet Take 1 tablet (15 mg total) by mouth at bedtime. (Patient not taking: Reported on 03/30/2022) 03/30/2022: Has not yet started   Probiotic Product (ALIGN PO) Take 1 capsule by mouth daily. (Patient not taking: Reported on 03/30/2022) 03/30/2022: Will take later this afternoon    [DISCONTINUED] FLUoxetine (PROZAC) 10 MG capsule Take 1 capsule (10 mg total) by mouth 2 (two) times daily. (Patient not taking: Reported on 03/20/2022) 03/30/2022: No longer taking   No facility-administered encounter medications on file as of 03/30/2022.   Allergies  Allergen Reactions   Iodinated Contrast Media Anaphylaxis   Iodine Anaphylaxis    IV and topical forms. Other reaction(s): Unknown   Levsin [Hyoscyamine Sulfate]     Vision problems/pt has glaucoma   Salmon [Fish Allergy] Hives and Shortness Of Breath   Shellfish Allergy Anaphylaxis   Tramadol Swelling   Remeron [Mirtazapine] Other (See Comments)    Cause  blurred vision and red eyes, pt has glaucoma   Aspirin Other (See Comments)    Sever stomach pain due to ulcer scaring.   Bis Subcit-Metronid-Tetracyc Swelling    Tongue swelling. Face tingling Other reaction(s): Unknown   Cephalexin Hives    Other reaction(s): hives   Ciprofloxacin Diarrhea   Codeine Nausea And Vomiting   Cyclobenzaprine Other (See Comments)    Tingly/prickly sensation. Other reaction(s): tingly/prickly sensation    Darvocet [Propoxyphene N-Acetaminophen] Nausea And Vomiting   Demerol [Meperidine] Nausea Only   Dexlansoprazole Swelling    Redness, swelling and peeling of both feet. Other reaction(s): foot pain   Diphedryl [Diphenhydramine] Other (See Comments)    Increased pulse/small amount ok   Doxycycline Hyclate Other (See Comments)    GI intolerance.   Doxycycline Hyclate     Other reaction(s): GI intolerance   Epinephrine Other (See Comments)    Breathing problems Other reaction(s): breathing problems/fainting   Erythromycin Other (See Comments)    GI intolerance. Other reaction(s): GI   Fish Oil     Other reaction(s): breathing problems/hives   Hyoscyamine     Other reaction(s): eye pain   Latex Other (See Comments)    Gloves ok.  Skin gets red from elastic in underwear and latex bandaides.   Meperidine Hcl     Other reaction(s): vomiting   Nitrofurantoin Diarrhea   Other     Other reaction(s): migraines Other reaction(s): Unknown Other reaction(s): Unknown Other reaction(s): Unknown Other reaction(s): increased pulse, faint, diarrhea   Prednisone Other (See Comments)    Headache Other reaction(s): headache   Ra Diphedryl Allergy [Diphenhydramine Hcl]     Other reaction(s): increased pulse small dose okay   Sertraline Hcl Swelling and Other (See Comments)    Migraine Swelling of tongue/lip (09/2012) Other reaction(s): Unknown   Shellfish-Derived Products     Other reaction(s): Unknown   Sulfa Antibiotics Other (See Comments)    Increased pulse, fainting, diarrhea, thrush   Wellbutrin [Bupropion] Other (See Comments)    Headaches   Xylocaine [Lidocaine Hcl]     With epinephrine, given by dentist.  Speeded up heart rate and she passed out (occured twice, at dentist)   Xylocaine [Lidocaine]     Other reaction(s): Unknown   Biaxin [Clarithromycin] Rash    Started after completing 10 day course of 2000 mg /day, Lips swelling   Ibuprofen Other (See Comments)    Motrin ok  with a GI effect. Other reaction(s): rash Motrin okay with a GI effect   ROS:  mouth sores per HPI. No URI symptoms, sore throat, cough, shortness of breath, chest pain.  +decreased appetite, burning in stomach, diarrhea last night. +depression. See HPI    PHYSICAL EXAM:  BP 134/70   Pulse 72   Temp 97.7 F (36.5 C) (Tympanic)   Ht '4\' 11"'$  (1.499 m)   Wt 91 lb 9.6 oz (41.5 kg)   LMP  (LMP Unknown)   BMI 18.50 kg/m   Wt Readings from Last 3 Encounters:  03/30/22 91 lb 9.6 oz (41.5 kg)  03/20/22 93 lb 12.8 oz (42.5 kg)  03/07/22 95 lb (43.1 kg)   Thin, frail elderly female, who appears depressed.  She is in no acute distress HEENT: conjunctiva and sclera are clear, EOMI. OP: 2 smal aphthous ulcers next to each other on upper left palate. No surrounding soft tissue swelling or erythema.Remainder of oral exam is normal. Tongue and lips appear normal. Neck: no lymphadenopathy or mass Psych: appears depressed, intermittently  on verge of tears.  Affect is limited. Normal hygiene and grooming  ASSESSMENT/PLAN:   Mouth sore - reassurred not c/w shingles; aphthous, minimal discomfort.OTC measures reviewed, avoid citrus, sharp foods (ie chips)  Severe recurrent major depression without psychotic features ([place]) - encouraged to start the remeron as rx'd by psych; to address concerns re: xanax with her psych   Recommended flu shot today, declined, prefers to get later (husband got today).

## 2022-03-30 NOTE — Patient Instructions (Addendum)
Your small ulcerations in the mouth do NOT appear to be related to shingles. It is likely a small aphthous ulcer. Since it is not very painful, there is no treatment necessary. If it becomes painful, you can try some over-the-counter medications to treat canker sores. Avoid acidic/citrus foods/juices which may irritate the area and cause more discomfort.  I encourage you to follow your psychiatrist's recommendations regarding medications.  I think remeron is a good idea, as it can help with appetite and moods.

## 2022-03-31 ENCOUNTER — Other Ambulatory Visit: Payer: Self-pay | Admitting: Family Medicine

## 2022-03-31 DIAGNOSIS — E039 Hypothyroidism, unspecified: Secondary | ICD-10-CM

## 2022-04-03 ENCOUNTER — Telehealth: Payer: Self-pay | Admitting: Adult Health

## 2022-04-03 ENCOUNTER — Ambulatory Visit: Payer: Medicare Other

## 2022-04-03 NOTE — Telephone Encounter (Signed)
Have her stop taking if not tolerating.

## 2022-04-03 NOTE — Telephone Encounter (Signed)
Pt stated she would rather deal with the GI symptoms then the headaches and dizziness.She has prozac 10 mg at home

## 2022-04-03 NOTE — Telephone Encounter (Signed)
Noted. Ty!

## 2022-04-03 NOTE — Telephone Encounter (Signed)
Pt stated mirtazapine is causing severe headaches and she is unable to get out of bed.The med has not helped with sleep either she is still waking up at 2am.Please advise

## 2022-04-03 NOTE — Telephone Encounter (Signed)
Pt called and said that she has been taking remereon. She has been experiencing dizzyness and headaches. She is unable to walk without holding on to things. Please call her and let her know what to do

## 2022-04-03 NOTE — Telephone Encounter (Signed)
Pt wants to know if she can go back on prozac because she is having a hard time with depression.She is still having trouble sleeping since mirtazapine did not work also.

## 2022-04-03 NOTE — Telephone Encounter (Signed)
She was having GI issues with it.

## 2022-04-04 ENCOUNTER — Ambulatory Visit (INDEPENDENT_AMBULATORY_CARE_PROVIDER_SITE_OTHER): Payer: Medicare Other | Admitting: Psychiatry

## 2022-04-04 DIAGNOSIS — F411 Generalized anxiety disorder: Secondary | ICD-10-CM

## 2022-04-04 NOTE — Progress Notes (Signed)
[facility] Counselor/Therapist Progress Note  Patient ID: GLYNDA SOLIDAY, MRN: [id number],    Date: 04/04/2022  Time Spent: 48 minutes  Virtual Visit via Telehealth Note: Telephone only as patient not able to manage the video Connected with patient by a telemedicine/telehealth application, with their informed consent, and verified patient privacy and that I am speaking with the correct person using two identifiers. I discussed the limitations, risks, security and privacy concerns of performing psychotherapy and the availability of in person appointments. I also discussed with the patient that there may be a patient responsible charge related to this service. The patient expressed understanding and agreed to proceed. I discussed the treatment planning with the patient. The patient was provided an opportunity to ask questions and all were answered. The patient agreed with the plan and demonstrated an understanding of the instructions. The patient was advised to call  our office if  symptoms worsen or feel they are in a crisis state and need immediate contact.   Therapist Location: office Patient Location: home  Treatment Type: Individual Therapy  Reported Symptoms: anxiety, depression, crying, "I'm just a wreck" (shared she's having med adjustment that will hopefully help)  Mental Status Exam:  Appearance:   N/A      Behavior:  Appropriate, Sharing, and some motivation  Motor:  Uses rollater to walk  Speech/Language:   Very tearful as she talks  Affect:  N/a  telehealth  Mood:  anxious, depressed, and sad  Thought process:  Difficulty focusing on sadness but did well in expressing it today  Thought content:    Rumination and some obsessive thoughts  Sensory/Perceptual disturbances:    WNL  Orientation:  oriented to person, place, time/date, situation, day of week, month of year, year, and stated date of Sept. 12, 2023  Attention:  Fair  Concentration:  Good and Fair  Memory:   [place] of knowledge:   Good and Fair  Insight:    Good and Fair  Judgment:   Good  Impulse Control:  Good   Risk Assessment: Danger to Self:  No Self-injurious Behavior: No Danger to Others: No Duty to Warn:no Physical Aggression / Violence:No  Access to Firearms a concern: No  Gang Involvement:No   Subjective: Patient  today reporting anxiety being stronger, fearful thoughts,depression, tearfulness, and feeling "I'm just a wreck, I'm a wreck again and my meds are off." Is working with her med provider with a medication shift. Needed session today to talk through her anxieties/fearful thoughts/depression/worries mostly related to her aging issues and increased health issues for her and husband.  Very tearful initially and was able to openly express her anxieties, sadness, and fears and eventually reached a point where she was much more calm and grounded and stated she felt better after letting herself cry.  Was able to see some positives in her future even though she and her husband are having more health issues.  Trying not to automatically assume worst case scenarios.  Has been talking with her family and she and husband are considering looking at a couple of long-term retirement care facilities here locally.  Was able to practice at end of session today naming some positives and some things that she feels are encouraging in their lives including some with her friends, having family that lives locally, and their church.  Reviewed some of our work in previous sessions on excessive worrying and how that supports depression and anxiety. States that when she "is not  upset it is easy for me to remember some of the things that I have learned from working in therapy "but much harder when she gets really upset or worried over something."  Still celebrating the fact that she and her husband have been married 30 years, still live in their own home and are managing relatively well a good part of the time  for their age.  Interventions: Cognitive Behavioral Therapy and Ego-Supportive  Long term goal: Develop the ability to recognize, accept, and cope with feelings of anxiety and depression. Short term goal: Verbalize any unresolved grief issues that may be contributing to anxiety and depression. Strategy: Replace negative self-defeating self talk with verbalization of realistic and positive cognitive messages to lessen depression/anxiety and improve mood.   Diagnosis:   ICD-10-CM   1. Generalized anxiety disorder  F41.1      Plan: Patient today really struggling at beginning of session but ended up showing some good motivation and active participation in session as she confronted some anxiety, depression, and fearful thoughts especially around healthcare issues for her and her husband, both in their 15's, and having had increased health concerns more recently.  She worked through a lot of her tearfulness and did well in confronting some of her fears of the future as well as addressing concerns in the present, showing a lot of strength.  Is especially showing progress in her ability to work through really painful experiences or situations and follow through on goal-directed behaviors with support.  Needs continued therapy to help strengthen her progress and belief in herself as she and her husband face significant issues in their life.  Some better today and interrupting negative thoughts and change her focus in a more positive direction, along with using some of her positive self-talk which is a newer strength for her. Encouraged patient in practicing more positive behaviors as discussed in sessions including: Positive self-care and self-talk, participate in activities as she is able that she has previously enjoyed including reading/phone calls with friends, attend or reach out to her church friends that have been supportive previously, listening to music which she finds very emotionally calming,  getting books from the nearby [employer] which she states she enjoys, trying to see more of the positives versus negatives, allow for good sleep patterns, stay in the present focusing on what she can control or change, stay in contact with family members and friends who are supportive, and realize the strength she shows working with goal directed behaviors to move in a direction that supports her improved emotional health.  Goal review and progress/challenges noted with patient.  Next appointment within 3 to 4 weeks.  This record has been created using [employer].  Chart creation errors have been sought, but may not always have been located and corrected.  Such creation errors do not reflect on the standard of medical care provided.   Shanon Ace, LCSW

## 2022-04-05 ENCOUNTER — Ambulatory Visit: Payer: Medicare Other | Admitting: Physical Therapy

## 2022-04-07 ENCOUNTER — Encounter: Payer: Self-pay | Admitting: Family Medicine

## 2022-04-07 ENCOUNTER — Other Ambulatory Visit: Payer: Medicare Other | Admitting: Family Medicine

## 2022-04-07 VITALS — BP 142/76 | HR 62 | Resp 16

## 2022-04-07 DIAGNOSIS — H1012 Acute atopic conjunctivitis, left eye: Secondary | ICD-10-CM | POA: Diagnosis not present

## 2022-04-07 DIAGNOSIS — H04122 Dry eye syndrome of left lacrimal gland: Secondary | ICD-10-CM | POA: Diagnosis not present

## 2022-04-07 NOTE — Progress Notes (Unsigned)
[facility] Consult Note Telephone: [phone]  Fax: [phone]    Date of encounter: 04/07/22 1:13 PM PATIENT NAME: Tracy Malone [address] [address]   [phone] (home)  DOB: 18-Apr-1932 MRN: [id number] PRIMARY CARE PROVIDER:    [name], MD,  [place] [address] [phone]  REFERRING PROVIDER:   [name], [address] [place],  [place] [address] [phone]  RESPONSIBLE PARTY:    Contact Information     Name Relation Home Work Mobile   Blaine Spouse [phone]     Ned Card Daughter [phone]  [phone]        I met face to face with patient and spouse in their home. Palliative Care was asked to follow this patient by consultation request of  [name], MD to address advance care planning and complex medical decision making. This is a follow up visit.                           ASSESSMENT , SYMPTOM MANAGEMENT AND PLAN / RECOMMENDATIONS:   Depression with Anxiety Encouraged joint activities with spouse, sitting on patio when cooler. Doing crafts together, playing cards/checkers or chess. Journaling reminiscences of positive times in their lives together and as a family particularly when negative or intrusive thoughts begin. Encourage continued participation with PT  Chronic left sided low back pain with sciatica of left side Continue PT and water exercise.  May benefit from massage therapy to decrease rigidity and spasm in back/leg. Encourage periodic movement to maintain mobility Use of lumbar roll when sitting  3.  Weight loss Slight weight loss Encouraged pt to use protein powder and speak with nutritionist about foods that will not exacerbate her colitis or IBS Uses Bentyl when pain is worst   CONJUNCTIVITIS left eye-Zaditor, artificial tears, ophthalmology follow up next week   Advance Care Planning/Goals of Care: Goals  include to maximize quality of life and symptom management.   CODE STATUS: MOST as of 11/02/21: Attempt CPR with limited additional interventions, do not use intubation or mechanical ventilation Antibiotics if indicated IV fluids and feeding tube for a defined trial period    Follow up Palliative Care Visit: Palliative care will continue to follow for complex medical decision making, advance care planning, and clarification of goals. Return 4 weeks or prn.    This visit was coded based on medical decision making (MDM).  PPS: 60%  HOSPICE ELIGIBILITY/DIAGNOSIS: TBD  Chief Complaint:  Palliative Care is following for chronic medical management in setting of Parkinson's disease with c/o left eye pain and discharge.  HISTORY OF PRESENT ILLNESS:  Tracy Malone is a 88 y.o. year old female with Parkinson's Disease, osteopenia, lumbar compression fracture, aortic atherosclerosis, GERD, IBS, functional dyspepsia, hx of colitis and ischemic colitis, chronic lymphocytic thyroiditis, cystocele, early satiety, depression with anxiety, low back pain with sciatica, bilateral leg pain and hx of trochanteric bursitis. She continues to c/o back pain and intestinal bloating and pain with certain foods.  She states she is getting conflicting advice on what to eat and what to avoid as well as whether or not she should be taking Bentyl with severe spasm pain.  Denies dysuria.  States she is currently "in a rut where I worry about him and he worries about me." She states she has anxiety and depression "but I refuse to give up although it feels like I have been trapped  in jail since the pandemic because I don't want to wear a mask and can't breathe so we just sit here and look at the idiot box."   She verbalizes not liking to take medications including her Sinemet.  She wants to follow up with a Neurology specialist at [facility] that she has spoken with and done some research about a procedure to help with tremors.  She  states she has been diagnosed with both Parkinson's and essential tremor. She is independent with bathing, dressing and all ADLs.  She has had no falls. She continues to work with PT, denies falls and continues to do some water classes.  Pain in low back and occasionally refers down left leg to knee and sometimes to heel.  Doing outpatient PT and aquatic therapy.  Using TENS unit with some relief. Back pain interrupts sleep.  Pain is a 5 on 10 scale currently, at best is a 4 on a 10 scale and rarely the worst pain is 10/10.  Standing, sitting or laying for prolonged periods becomes painful.  Had gradual weight loss over 3 year period from 117 when she had back surgery to 97.4 lbs currently.  Eats small amounts, frequently due to delayed gastric emptying.  She states she has had worsening since she had the last [place] vaccination and since her back surgery. Appetite is fair.  Has had eye problems which sometimes gets her up at night.  She is using Systane preservative free.  She states her balance is good.    History obtained from review of EMR, discussion with spouse and/or Tracy Malone.   Left rib xray 01/30/22 as she was continuing to have pain from a fall 1 year ago with no fracture on xray.  I reviewed available labs, medications, imaging, studies and related documents from the EMR.  Records reviewed and summarized above.   ROS General: NAD Cardiovascular: denies chest pain, denies DOE Pulmonary: denies cough, denies increased SOB Abdomen: endorses "fair" appetite, denies constipation, endorses continence of bowel GU: denies dysuria, endorses continence of urine with rare urge incontinence MSK:  denies weakness after significant exertion, chronic low back pain and sciatic pain  no falls reported Neuro:  Low back pain with no numbness and intermittent bouts of insomnia due to back pain Psych: Endorses depressed and anxious mood  Physical Exam: Current and past weights: currently weight is 95  lbs as of 03/07/22, prior weight was 97.4 lbs  Constitutional: NAD General: thin, WD  CV: S1S2, Occasionally irregular, no LE edema Pulmonary: CTAB, no increased work of breathing, no cough, room air Abdomen:  normo-active BS + 4 quadrants, soft and non tender, no ascites MSK: no sarcopenia, moves all extremities, ambulatory Neuro:  no generalized weakness,  no cognitive impairment Psych: non-anxious affect, A and O x 3 Hem/lymph/immuno: no widespread bruising   Thank you for the opportunity to participate in the care of Tracy Malone.  The palliative care team will continue to follow. Please call our office at [phone] if we can be of additional assistance.   Marijo Conception, FNP -C  COVID-19 PATIENT SCREENING TOOL Asked and negative response unless otherwise noted:   Have you had symptoms of covid, tested positive or been in contact with someone with symptoms/positive test in the past 5-10 days?  No

## 2022-04-09 DIAGNOSIS — H1012 Acute atopic conjunctivitis, left eye: Secondary | ICD-10-CM | POA: Insufficient documentation

## 2022-04-09 DIAGNOSIS — H04122 Dry eye syndrome of left lacrimal gland: Secondary | ICD-10-CM | POA: Insufficient documentation

## 2022-04-12 DIAGNOSIS — Z85828 Personal history of other malignant neoplasm of skin: Secondary | ICD-10-CM | POA: Diagnosis not present

## 2022-04-12 DIAGNOSIS — L814 Other melanin hyperpigmentation: Secondary | ICD-10-CM | POA: Diagnosis not present

## 2022-04-12 DIAGNOSIS — D1801 Hemangioma of skin and subcutaneous tissue: Secondary | ICD-10-CM | POA: Diagnosis not present

## 2022-04-12 DIAGNOSIS — D225 Melanocytic nevi of trunk: Secondary | ICD-10-CM | POA: Diagnosis not present

## 2022-04-12 DIAGNOSIS — L820 Inflamed seborrheic keratosis: Secondary | ICD-10-CM | POA: Diagnosis not present

## 2022-04-12 DIAGNOSIS — L821 Other seborrheic keratosis: Secondary | ICD-10-CM | POA: Diagnosis not present

## 2022-04-12 DIAGNOSIS — L570 Actinic keratosis: Secondary | ICD-10-CM | POA: Diagnosis not present

## 2022-04-14 DIAGNOSIS — H0100B Unspecified blepharitis left eye, upper and lower eyelids: Secondary | ICD-10-CM | POA: Diagnosis not present

## 2022-04-14 DIAGNOSIS — H0100A Unspecified blepharitis right eye, upper and lower eyelids: Secondary | ICD-10-CM | POA: Diagnosis not present

## 2022-04-17 ENCOUNTER — Ambulatory Visit (INDEPENDENT_AMBULATORY_CARE_PROVIDER_SITE_OTHER): Payer: Medicare Other | Admitting: Psychiatry

## 2022-04-17 DIAGNOSIS — F411 Generalized anxiety disorder: Secondary | ICD-10-CM | POA: Diagnosis not present

## 2022-04-17 NOTE — Progress Notes (Addendum)
[facility] Counselor/Therapist Progress Note  Patient ID: Tracy Malone, MRN: [id number],    Date: 04/17/2022  Time Spent: 50 minutes   Virtual Visit via Telehealth Note: Telephone Only as patient unable to do the Video Connected with patient by a telemedicine/telehealth application, with their informed consent, and verified patient privacy and that I am speaking with the correct person using two identifiers. I discussed the limitations, risks, security and privacy concerns of performing psychotherapy and the availability of in person appointments. I also discussed with the patient that there may be a patient responsible charge related to this service. The patient expressed understanding and agreed to proceed. I discussed the treatment planning with the patient. The patient was provided an opportunity to ask questions and all were answered. The patient agreed with the plan and demonstrated an understanding of the instructions. The patient was advised to call  our office if  symptoms worsen or feel they are in a crisis state and need immediate contact.   Therapist Location: office Patient Location: home   Treatment Type: Individual Therapy  Reported Symptoms: anxiety, depressed, crying, "don't know why I'm depressed ", wonders if my Parkinson's is making me more depressed  Mental Status Exam:  Appearance:   N/a   telehealth      Behavior:  N/a  telehealth  Motor:  Walks with rollator or cane  Speech/Language:   Clear and Coherent  Affect:  N/a   telehealth  Mood:  anxious, depressed, and sad, tearful  Thought process:  Tearful, some tangentiality  Thought content:    Rumination  Sensory/Perceptual disturbances:    WNL  Orientation:  oriented to person, place, time/date, situation, day of week, month of year, year, and stated date of Sept 25, 2023  Attention:  Good  Concentration:  Good  Memory:  WNL  Fund of knowledge:   Good  Insight:    Good and Fair  Judgment:   Good   Impulse Control:  Good and Fair   Risk Assessment: Danger to Self:  No Self-injurious Behavior: No Danger to Others: No Duty to Warn:no Physical Aggression / Violence:No  Access to Firearms a concern: No  Gang Involvement:No   Subjective: Patient today reporting increased anxiety and depression, tearfulness stating that she is "just a wreck just a mess" today but eventually with support, her tearfulness did end and she was able to share and process her depressed feelings over her and her husband's health challenges.  They are both in their upper 37s and have been together married for 69 years and are definitely having more health concerns.  Patient feeling alone and that she had no one to talk to so needed the session to be able to vent, feel more understood and not alone, and also was able to help redirect her to some more positives in their lives including the family members that check on them, some of the progress that her husband has made in his health and some things patient can do to get more support from her family and reach out to her church.  Patient did seem more calm later in session.  Tearfulness ended and worked with her also on trying to let go of some of her worrying that supports a lot of her depression and anxiety, and also be able to recognize and focus more on some of her positives and what has helped her during discouraging moments previously.  She was able to recall some things that helped and  eventually seemed sad before ending session today.  Encouraged her to get outside some and reach out to her family and church who also help provide good support.  Interventions: Cognitive Behavioral Therapy and Ego-Supportive  Long term goal: Develop the ability to recognize, accept, and cope with feelings of anxiety and depression. Short term goal: Verbalize any unresolved grief issues that may be contributing to anxiety and depression. Strategy: Replace negative self-defeating self  talk with verbalization of realistic and positive cognitive messages to lessen depression/anxiety and improve mood.   Diagnosis:   ICD-10-CM   1. Generalized anxiety disorder  F41.1      Plan: Patient today was having a rougher time especially towards the beginning of session but was able to talk through a lot of her worries, crying often, but eventually got to a calmer place where she could talk some and be heard better as she was not crying.  Also was able to realize some of the things she needed to be sure and do to feel more supported such as reaching out and contacting family and church.  Encouraged to interrupt the negative assumptions that she was making and dwelling on which always leads her to feel more depressed.  Did respond well to this interruption and began talking a little more hopefully and was able to talk about some of the things she can do to help herself including not holding on to all the negative thoughts and assumptions.  Fears currently and fears into the future as they get older.  Was able to processes in session today but also realize how they have "a lot of blessings".  Encouraged patient in her practice of more positive behaviors as noted in session including: Positive self-care and self-talk, participating in activities as she is able and has previously enjoyed including reading/phone calls with friends, reach out to her church that has been supportive of her previously, listening to music which she finds very emotionally calming, reading, trying to see more the positives versus negatives, allow for good sleep patterns, stay in the present focusing on what she can control or change, remaining contact with family members and friends who are supportive, and recognize the strength she shows working with goal directed behaviors to move in a direction that supports her improved emotional health.  Goal review and progress/challenges noted with patient.  Next appointment within 3-4   weeks.  This record has been created using [employer].  Chart creation errors have been sought, but may not always have been located and corrected.  Such creation errors do not reflect on the standard of medical care provided.   Shanon Ace, LCSW

## 2022-04-18 ENCOUNTER — Telehealth: Payer: Self-pay | Admitting: Neurology

## 2022-04-18 ENCOUNTER — Telehealth: Payer: Self-pay | Admitting: Adult Health

## 2022-04-18 NOTE — Telephone Encounter (Signed)
Called pt. Advised she should continue medication as typically prescribed. She should not take any extra d/t missed doses. She noticed no tremor this am. She has f/u 06/07/22 and will discuss dose at appt. Offered sooner appt 05/09/22 w/ [name] but she declined. Husband had apt and unable to bring her then. I added her to wait list in case any cx occur

## 2022-04-18 NOTE — Telephone Encounter (Signed)
Pt called and said that she would like to increase the prozac from 10 mg to 20 mg. She doesn't feel that the 10 mg is working. Please call her and lett her know if she can increase the prozac. Call her at [phone]

## 2022-04-18 NOTE — Telephone Encounter (Signed)
Pt is calling. Stated she has missed two doses of  Carbidopa-Levodopa ER (SINEMET CR) 25-100 MG tablet controlled release. Pt is requesting a returning call.

## 2022-04-19 ENCOUNTER — Other Ambulatory Visit: Payer: Self-pay

## 2022-04-19 NOTE — Telephone Encounter (Signed)
Patient is asking to increase Prozac from 10 to 20 due to uncontrolled depression. She said she cries all the time. She can get to sleep, can't stay asleep. I discussed with her that she has been on this several times and she c/o gut issues. She said she has the gut issues anyway from the Sinemet but that she has to have something. Okay to increase?

## 2022-04-19 NOTE — Telephone Encounter (Signed)
Called patient to let her know it was okay to increase Prozac to 20 mg. She has an appt with Gina on 10/4. She said she had enough medication to last her until then, so I will not send in a Rx in case medication is changed.

## 2022-04-19 NOTE — Telephone Encounter (Signed)
Yes, that's fine 

## 2022-04-20 ENCOUNTER — Emergency Department (HOSPITAL_BASED_OUTPATIENT_CLINIC_OR_DEPARTMENT_OTHER): Payer: Medicare Other

## 2022-04-20 ENCOUNTER — Emergency Department (HOSPITAL_BASED_OUTPATIENT_CLINIC_OR_DEPARTMENT_OTHER)
Admission: EM | Admit: 2022-04-20 | Discharge: 2022-04-20 | Disposition: A | Discharge status: Home / Self Care | Payer: Medicare Other | Attending: Emergency Medicine | Admitting: Emergency Medicine

## 2022-04-20 ENCOUNTER — Other Ambulatory Visit: Payer: Self-pay

## 2022-04-20 ENCOUNTER — Encounter (HOSPITAL_BASED_OUTPATIENT_CLINIC_OR_DEPARTMENT_OTHER): Payer: Self-pay | Admitting: Emergency Medicine

## 2022-04-20 DIAGNOSIS — R109 Unspecified abdominal pain: Secondary | ICD-10-CM

## 2022-04-20 DIAGNOSIS — R1013 Epigastric pain: Secondary | ICD-10-CM | POA: Diagnosis present

## 2022-04-20 DIAGNOSIS — G20 Parkinson's disease: Secondary | ICD-10-CM | POA: Insufficient documentation

## 2022-04-20 DIAGNOSIS — Z91040 Latex allergy status: Secondary | ICD-10-CM | POA: Insufficient documentation

## 2022-04-20 DIAGNOSIS — R1032 Left lower quadrant pain: Secondary | ICD-10-CM | POA: Diagnosis not present

## 2022-04-20 DIAGNOSIS — K5900 Constipation, unspecified: Secondary | ICD-10-CM | POA: Diagnosis not present

## 2022-04-20 LAB — URINALYSIS, ROUTINE W REFLEX MICROSCOPIC
Bilirubin Urine: NEGATIVE
Glucose, UA: NEGATIVE mg/dL
Ketones, ur: NEGATIVE mg/dL
Nitrite: NEGATIVE
Protein, ur: NEGATIVE mg/dL
Specific Gravity, Urine: 1.006 (ref 1.005–1.030)
pH: 6.5 (ref 5.0–8.0)

## 2022-04-20 LAB — CBC WITH DIFFERENTIAL/PLATELET
Abs Immature Granulocytes: 0.01 10*3/uL (ref 0.00–0.07)
Basophils Absolute: 0.1 10*3/uL (ref 0.0–0.1)
Basophils Relative: 1 %
Eosinophils Absolute: 0.3 10*3/uL (ref 0.0–0.5)
Eosinophils Relative: 4 %
HCT: 47.3 % — ABNORMAL HIGH (ref 36.0–46.0)
Hemoglobin: 15.3 g/dL — ABNORMAL HIGH (ref 12.0–15.0)
Immature Granulocytes: 0 %
Lymphocytes Relative: 36 %
Lymphs Abs: 2.8 10*3/uL (ref 0.7–4.0)
MCH: 30.2 pg (ref 26.0–34.0)
MCHC: 32.3 g/dL (ref 30.0–36.0)
MCV: 93.5 fL (ref 80.0–100.0)
Monocytes Absolute: 0.6 10*3/uL (ref 0.1–1.0)
Monocytes Relative: 8 %
Neutro Abs: 3.9 10*3/uL (ref 1.7–7.7)
Neutrophils Relative %: 51 %
Platelets: 264 10*3/uL (ref 150–400)
RBC: 5.06 MIL/uL (ref 3.87–5.11)
RDW: 13.3 % (ref 11.5–15.5)
WBC: 7.7 10*3/uL (ref 4.0–10.5)
nRBC: 0 % (ref 0.0–0.2)

## 2022-04-20 LAB — COMPREHENSIVE METABOLIC PANEL
ALT: 6 U/L (ref 0–44)
AST: 19 U/L (ref 15–41)
Albumin: 4.3 g/dL (ref 3.5–5.0)
Alkaline Phosphatase: 57 U/L (ref 38–126)
Anion gap: 9 (ref 5–15)
BUN: 16 mg/dL (ref 8–23)
CO2: 29 mmol/L (ref 22–32)
Calcium: 9.8 mg/dL (ref 8.9–10.3)
Chloride: 100 mmol/L (ref 98–111)
Creatinine, Ser: 0.68 mg/dL (ref 0.44–1.00)
GFR, Estimated: >60 mL/min (ref >60)
Glucose, Bld: 91 mg/dL (ref 70–99)
Potassium: 3.9 mmol/L (ref 3.5–5.1)
Sodium: 138 mmol/L (ref 135–145)
Total Bilirubin: 1.3 mg/dL — ABNORMAL HIGH (ref 0.3–1.2)
Total Protein: 7.3 g/dL (ref 6.5–8.1)

## 2022-04-20 LAB — LIPASE, BLOOD: Lipase: 64 U/L — ABNORMAL HIGH (ref 11–51)

## 2022-04-20 LAB — LACTIC ACID, PLASMA: Lactic Acid, Venous: 1.4 mmol/L (ref 0.5–1.9)

## 2022-04-20 MED ORDER — SODIUM CHLORIDE 0.9 % IV BOLUS
500.0000 mL | Freq: Once | INTRAVENOUS | Status: AC
  Administered 2022-04-20: 500 mL via INTRAVENOUS

## 2022-04-20 MED ORDER — ACETAMINOPHEN 325 MG PO TABS
650.0000 mg | ORAL_TABLET | Freq: Four times a day (QID) | ORAL | Status: DC | PRN
  Administered 2022-04-20: 650 mg via ORAL
  Filled 2022-04-20 (×2): qty 2

## 2022-04-20 MED ORDER — DOCUSATE SODIUM 100 MG PO CAPS
100.0000 mg | ORAL_CAPSULE | Freq: Once | ORAL | Status: AC
  Administered 2022-04-20: 100 mg via ORAL
  Filled 2022-04-20: qty 1

## 2022-04-20 MED ORDER — DOCUSATE SODIUM 100 MG PO CAPS: 100.0000 mg | ORAL_CAPSULE | Freq: Two times a day (BID) | ORAL | 0 refills | Status: DC | PRN

## 2022-04-20 MED ORDER — ALUM & MAG HYDROXIDE-SIMETH 200-200-20 MG/5ML PO SUSP
15.0000 mL | Freq: Once | ORAL | Status: AC
  Administered 2022-04-20: 15 mL via ORAL
  Filled 2022-04-20: qty 30

## 2022-04-20 MED ORDER — POLYETHYLENE GLYCOL 3350 17 G PO PACK: 17.0000 g | PACK | Freq: Every day | ORAL | 0 refills | Status: DC | PRN

## 2022-04-20 NOTE — ED Triage Notes (Signed)
Pt from home c/o of constant LUQ abdominal pain that radiates into her back for the past weeks that gets worse at night. Pt states that she has not been eating much but denis N/V.

## 2022-04-20 NOTE — ED Provider Notes (Signed)
[facility] EMER[facility]ENC[facility] DEPT Provider Note   CSN: [id number] Arrival date & time: 04/20/22  3007     History  Chief Complaint  Patient presents with   Abdominal Pain   HPI Tracy Malone is a 88 y.o. female with PMH of GERD, IBS, Parkinson's and ischemic colitis presenting for abdominal pain.  Abdominal pain started 3 weeks ago.  Located in epigastric region and radiates to the right upper quadrant and back at times.  The pain is intermittent.  Feels sharp.  Denies nausea and vomiting but endorses some diarrhea and constipation.  Also mentioned that she had a fracture of both of the left lower ribs a year ago.  States that her pain today feels like the pain she had after her rib fracture.  Patient stated that it has been a few days since she had a good bowel movement.   Abdominal Pain      Home Medications Prior to Admission medications   Medication Sig Start Date End Date Taking? Authorizing Provider  docusate sodium (COLACE) 100 MG capsule Take 1 capsule (100 mg total) by mouth 2 (two) times daily as needed for mild constipation. 04/20/22  Yes Harriet Pho, PA-C  polyethylene glycol (MIRALAX) 17 g packet Take 17 g by mouth daily as needed. 04/20/22  Yes Harriet Pho, PA-C  ALPRAZolam Duanne Moron) 0.5 MG tablet Take four tablets daily as needed for anxiety. Patient not taking: Reported on 03/30/2022 01/25/22   Mozingo, Berdie Ogren, NP  Carbidopa-Levodopa ER (SINEMET CR) 25-100 MG tablet controlled release TAKE ONE TABLET BY MOUTH EVERY MORNING; 1 TABLET AT NOON; AND 1 TABLET AT BEDTIME Patient not taking: Reported on 03/30/2022 11/18/21   Sater, Nanine Means, MD  dicyclomine (BENTYL) 10 MG capsule TAKE 1 CAPSULE BY MOUTH EVERY 6 HOURS AS NEEDED 03/23/22   Milus Banister, MD  mirtazapine (REMERON) 15 MG tablet Take 1 tablet (15 mg total) by mouth at bedtime. Patient not taking: Reported on 03/30/2022 03/28/22   Mozingo, Berdie Ogren, NP  SYNTHROID 25 MCG tablet Take 1  tablet Monday through Saturday, skipping Sundays. 03/31/22   [name], MD  ZADITOR 0.025 % ophthalmic solution Place 1 drop into the left eye 2 (two) times daily. 04/07/22   [provider]      Allergies    Iodinated contrast media, Iodine, Levsin [hyoscyamine sulfate], Salmon [fish allergy], Shellfish allergy, Tramadol, Remeron [mirtazapine], Aspirin, Bis subcit-metronid-tetracyc, Cephalexin, Ciprofloxacin, Codeine, Cyclobenzaprine, Darvocet [propoxyphene n-acetaminophen], Demerol [meperidine], Dexlansoprazole, Diphedryl [diphenhydramine], Doxycycline hyclate, Doxycycline hyclate, Epinephrine, Erythromycin, Fish oil, Hyoscyamine, Latex, Meperidine hcl, Nitrofurantoin, Other, Prednisone, Ra diphedryl allergy [diphenhydramine hcl], Sertraline hcl, Shellfish-derived products, Sulfa antibiotics, Wellbutrin [bupropion], Xylocaine [lidocaine hcl], Xylocaine [lidocaine], Biaxin [clarithromycin], and Ibuprofen    Review of Systems   Review of Systems  Gastrointestinal:  Positive for abdominal pain.    Physical Exam Updated Vital Signs BP (!) 161/66   Pulse 62   Temp 97.9 F (36.6 C)   Resp 18   Ht '4\' 11"'$  (1.499 m)   Wt 40.8 kg   LMP  (LMP Unknown)   SpO2 97%   BMI 18.18 kg/m  Physical Exam Vitals and nursing note reviewed.  HENT:     Head: Normocephalic and atraumatic.     Mouth/Throat:     Mouth: Mucous membranes are moist.  Eyes:     General:        Right eye: No discharge.        Left eye: No discharge.  Conjunctiva/sclera: Conjunctivae normal.  Cardiovascular:     Rate and Rhythm: Normal rate and regular rhythm.     Pulses: Normal pulses.     Heart sounds: Normal heart sounds.  Pulmonary:     Effort: Pulmonary effort is normal.     Breath sounds: Normal breath sounds.  Abdominal:     General: Abdomen is flat.     Palpations: Abdomen is soft.     Tenderness: There is abdominal tenderness in the right upper quadrant, epigastric area and suprapubic area.  Skin:     General: Skin is warm and dry.  Neurological:     General: No focal deficit present.  Psychiatric:        Mood and Affect: Mood normal.     ED Results / Procedures / Treatments   Labs (all labs ordered are listed, but only abnormal results are displayed) Labs Reviewed  COMPREHENSIVE METABOLIC PANEL - Abnormal; Notable for the following components:      Result Value   Total Bilirubin 1.3 (*)    All other components within normal limits  LIPASE, BLOOD - Abnormal; Notable for the following components:   Lipase 64 (*)    All other components within normal limits  CBC WITH DIFFERENTIAL/PLATELET - Abnormal; Notable for the following components:   Hemoglobin 15.3 (*)    HCT 47.3 (*)    All other components within normal limits  URINALYSIS, ROUTINE W REFLEX MICROSCOPIC - Abnormal; Notable for the following components:   Color, Urine COLORLESS (*)    Hgb urine dipstick TRACE (*)    Leukocytes,Ua TRACE (*)    All other components within normal limits  LACTIC ACID, PLASMA  LACTIC ACID, PLASMA    EKG EKG Interpretation  Date/Time:  Thursday April 20 2022 10:01:46 EDT Ventricular Rate:  59 PR Interval:  236 QRS Duration: 104 QT Interval:  426 QTC Calculation: 421 R Axis:   -19 Text Interpretation: Sinus bradycardia with 1st degree A-V block Cannot rule out Anterior infarct , age undetermined Abnormal ECG When compared with ECG of 07-Mar-2021 07:53, PREVIOUS ECG IS PRESENT Confirmed by Garnette Gunner [id number]) on 04/20/2022 2:13:18 PM  Radiology CT Abdomen Pelvis Wo Contrast  Result Date: 04/20/2022 CLINICAL DATA:  Left upper quadrant abdominal pain for weeks EXAM: CT ABDOMEN AND PELVIS WITHOUT CONTRAST TECHNIQUE: Multidetector CT imaging of the abdomen and pelvis was performed following the standard protocol without IV contrast. RADIATION DOSE REDUCTION: This exam was performed according to the departmental dose-optimization program which includes automated exposure control,  adjustment of the mA and/or kV according to patient size and/or use of iterative reconstruction technique. COMPARISON:  06/29/2021 FINDINGS: Lower chest: No acute abnormality. Hepatobiliary: No solid liver abnormality is seen. No gallstones, gallbladder wall thickening, or biliary dilatation. Pancreas: Unremarkable. No pancreatic ductal dilatation or surrounding inflammatory changes. Spleen: Normal in size without significant abnormality. Adrenals/Urinary Tract: Adrenal glands are unremarkable. Kidneys are normal, without renal calculi, solid lesion, or hydronephrosis. Bladder is unremarkable. Stomach/Bowel: Stomach is within normal limits. Appendix appears normal. No evidence of bowel wall thickening, distention, or inflammatory changes. Moderate burden of stool throughout the colon with stool balls in the rectum. Vascular/Lymphatic: Aortic atherosclerosis. No enlarged abdominal or pelvic lymph nodes. Reproductive: Status post hysterectomy. Other: No abdominal wall hernia or abnormality. No ascites. Pelvic floor prolapse with a small cystocele (series 6, image 60). Musculoskeletal: No acute or significant osseous findings. Wedge deformity with vertebral cement augmentation of L1. IMPRESSION: 1. No acute noncontrast CT findings of the abdomen  or pelvis to explain left upper quadrant pain. 2. Moderate burden of stool throughout the colon with stool balls in the rectum. 3. Pelvic floor prolapse with a small cystocele. Aortic Atherosclerosis (ICD10-I70.0). Electronically Signed   By: Delanna Ahmadi M.D.   On: 04/20/2022 14:05    Procedures Procedures    Medications Ordered in ED Medications  acetaminophen (TYLENOL) tablet 650 mg (650 mg Oral Given 04/20/22 1429)  sodium chloride 0.9 % bolus 500 mL (0 mLs Intravenous Stopped 04/20/22 1513)  alum & mag hydroxide-simeth (MAALOX/MYLANTA) 200-200-20 MG/5ML suspension 15 mL (15 mLs Oral Given 04/20/22 1429)  docusate sodium (COLACE) capsule 100 mg (100 mg Oral Given  04/20/22 1621)    ED Course/ Medical Decision Making/ A&P                           Medical Decision Making Amount and/or Complexity of Data Reviewed Labs: ordered. Radiology: ordered.  Risk OTC drugs.   This patient presents to the ED for concern of abdominal pain, this involves a number of treatment options, and is a complaint that carries with it a high risk of complications and morbidity.  The differential diagnosis includes pancreatitis, gallbladder pathology, ACS, mesenteric ischemia, PUD, and acute cystitis.   Co morbidities: Discussed in HPI   EMR reviewed including pt PMHx, past surgical history and past visits to ER.   See HPI for more details   Lab Tests:   I ordered and independently interpreted labs. Labs notable for elevated lipase   Imaging Studies:  Abnormal findings. I personally reviewed all imaging studies. Imaging notable for moderate stool burden in the colon    Cardiac Monitoring:  The patient was maintained on a cardiac monitor.  I personally viewed and interpreted the cardiac monitored which showed an underlying rhythm of: first degree av block EKG non-ischemic   Medicines ordered:  I ordered medication including normal saline for volume resuscitation, Maalox and Mylanta for symptomatic relief, Colace for constipation Reevaluation of the patient after these medicines showed that the patient improved I have reviewed the patients home medicines and have made adjustments as needed   Consults/Attending Physician   I discussed this case with my attending physician who cosigned this note including patient's presenting symptoms, physical exam, and planned diagnostics and interventions. Attending physician stated agreement with plan or made changes to plan which were implemented.   Reevaluation:  After the interventions noted above I re-evaluated patient and found that they have :improved     Problem List / ED Course: Patient presented  for epigastric abdominal pain.  Labs revealed elevated lipase.  Initial concern was pancreatitis.  But unlikely given unremarkable CT scan.  Scan however did reveal moderate stool burden in the colon.  This is consistent with her history she stated she has not had a good bowel movement in few days.  This could also be contributing to her pain.  Also considered ACS given location of her abdominal pain but unlikely given that she is not short of breath and EKG is nonischemic.  Treated constipation with Colace here.  And ordered Colace and MiraLAX to be taken as needed at home for constipation.  Also recommended good hydration and fiber intake and follow-up with PCP regarding ongoing constipation.  Also considered mesenteric ischemia but unlikely given lactate within normal limits.  Dispostion:  After consideration of the diagnostic results and the patients response to treatment, I feel that the patent would benefit from  Final Clinical Impression(s) / ED Diagnoses Final diagnoses:  Abdominal pain, unspecified abdominal location  Constipation, unspecified constipation type    Rx / DC Orders ED Discharge Orders          Ordered    docusate sodium (COLACE) 100 MG capsule  2 times daily PRN        04/20/22 1708    polyethylene glycol (MIRALAX) 17 g packet  Daily PRN        04/20/22 1709              Harriet Pho, PA-C 04/20/22 1723    Cristie Hem, MD 04/21/22 [phone]

## 2022-04-20 NOTE — Discharge Instructions (Addendum)
Work-up for your abdominal pain was overall reassuring.  CT of your abdomen pelvis did reveal a moderate stool burden in your colon.  This is consistent with constipation.  Constipation could be causing your abdominal pain.  Recommend that you take Colace and MiraLAX as needed for constipation.  Also recommend fiber intake and good hydration.  Also please follow-up with your PCP regarding: Constipation.  If you have new fever, worsening abdominal pain, chest pain, shortness of breath please return to the emergency department further evaluation.

## 2022-04-21 ENCOUNTER — Telehealth: Payer: Self-pay

## 2022-04-21 NOTE — Telephone Encounter (Signed)
Transition Care Management Follow-up Telephone Call Date of discharge and from where: [facility]  How have you been since you were released from the hospital? Stomach burning but not as bad Any questions or concerns? No  Items Reviewed: Did the pt receive and understand the discharge instructions provided? Yes  Medications obtained and verified? Yes  Other? No  Any new allergies since your discharge? No  Dietary orders reviewed? Yes Do you have support at home? Yes   Home Care and Equipment/Supplies: Were home health services ordered? no  Follow up appointments reviewed:  PCP Hospital f/u appt confirmed? Yes  Scheduled to see Dr. Tomi Bamberger on 04/24/22 @ 2:15p.m. [facility] f/u appt confirmed? Yes  Scheduled to see GI on 04/28/22 @ 1:40. Are transportation arrangements needed? No  If their condition worsens, is the pt aware to call PCP or go to the Emergency Dept.? Yes Was the patient provided with contact information for the PCP's office or ED? Yes Was to pt encouraged to call back with questions or concerns? Yes

## 2022-04-23 NOTE — Progress Notes (Deleted)
No chief complaint on file.   Patient presents for ER f/u.  She went to ER on 9/28 with complaints of abdominal pain.  Pain was in epigastrium, RUQ and radiated to her back. Lipase was elevated, but CT was unremarkable.  There was moderate stool burden in colon, and pt reported some intermittent constipation. She was given colace, recommended colace and miralax at home, as well as fluids and high fiber diet.  Considered dx of mesenteric ischemia, but lactate was WNL.  She presents today for follow up and reports.  CT abdomen IMPRESSION: 1. No acute noncontrast CT findings of the abdomen or pelvis to explain left upper quadrant pain. 2. Moderate burden of stool throughout the colon with stool balls in the rectum. 3. Pelvic floor prolapse with a small cystocele.   Aortic Atherosclerosis (ICD10-I70.0).  Labs from ER:  Glu 91   Chemistry      Component Value Date/Time   NA 138 04/20/2022 1022   NA 142 11/02/2021 1117   K 3.9 04/20/2022 1022   CL 100 04/20/2022 1022   CO2 29 04/20/2022 1022   BUN 16 04/20/2022 1022   BUN 12 11/02/2021 1117   CREATININE 0.68 04/20/2022 1022   CREATININE 0.75 10/22/2018 1401   CREATININE 0.67 11/23/2016 0815      Component Value Date/Time   CALCIUM 9.8 04/20/2022 1022   ALKPHOS 57 04/20/2022 1022   AST 19 04/20/2022 1022   AST 17 10/22/2018 1401   ALT 6 04/20/2022 1022   ALT 7 10/22/2018 1401   BILITOT 1.3 (H) 04/20/2022 1022   BILITOT 1.3 (H) 11/02/2021 1117   BILITOT 0.8 10/22/2018 1401     Lab Results  Component Value Date   LIPASE 64 (H) 04/20/2022   Lab Results  Component Value Date   WBC 7.7 04/20/2022   HGB 15.3 (H) 04/20/2022   HCT 47.3 (H) 04/20/2022   MCV 93.5 04/20/2022   PLT 264 04/20/2022   Lactic acid 1.4 (nl) Urine: clear, trace Hgb and leuks. SG 1.006  Pt with hypothyroidism, on synthroid.  Last TSH WNL. Other than constipation, denies changes related to thyroid--denies changes to hair/skin/nails.  Chronic  decreased energy and depression.  Unchanged.   Lab Results  Component Value Date   TSH 2.180 10/06/2021     PMH, PSH, SH reviewed    ROS:  no fever, chills, URI symptoms, chest pain, shortness of breath. No nausea. Vomiting. Abdominal pain Bowels  No bleeding, bruising, rashes.   PHYSICAL EXAM:  LMP  (LMP Unknown)   Wt Readings from Last 3 Encounters:  04/20/22 90 lb (40.8 kg)  03/30/22 91 lb 9.6 oz (41.5 kg)  03/20/22 93 lb 12.8 oz (42.5 kg)     ASSESSMENT/PLAN:   Flu shot today COVID booster and RSV discussed  Has she gotten Tdap or Shingrix from pharmacy??

## 2022-04-24 ENCOUNTER — Inpatient Hospital Stay: Payer: Medicare Other | Admitting: Family Medicine

## 2022-04-26 ENCOUNTER — Ambulatory Visit: Payer: Medicare Other | Admitting: Adult Health

## 2022-04-28 ENCOUNTER — Encounter: Payer: Self-pay | Admitting: Gastroenterology

## 2022-04-28 ENCOUNTER — Ambulatory Visit (INDEPENDENT_AMBULATORY_CARE_PROVIDER_SITE_OTHER): Payer: Medicare Other | Admitting: Gastroenterology

## 2022-04-28 ENCOUNTER — Inpatient Hospital Stay: Payer: Medicare Other | Admitting: Family Medicine

## 2022-04-28 VITALS — BP 110/62 | HR 70 | Ht 59.0 in | Wt 93.0 lb

## 2022-04-28 DIAGNOSIS — R140 Abdominal distension (gaseous): Secondary | ICD-10-CM | POA: Diagnosis not present

## 2022-04-28 DIAGNOSIS — R0781 Pleurodynia: Secondary | ICD-10-CM | POA: Diagnosis not present

## 2022-04-28 DIAGNOSIS — K581 Irritable bowel syndrome with constipation: Secondary | ICD-10-CM

## 2022-04-28 NOTE — Progress Notes (Signed)
[id number] 

## 2022-04-28 NOTE — Patient Instructions (Addendum)
_______________________________________________________ If you are age 86 or older, your body mass index should be between 23-30. Your Body mass index is 18.78 kg/m. If this is out of the aforementioned range listed, please consider follow up with your Primary Care Provider.  ________________________________________________________  The [facility] providers would like to encourage you to use [facility] to communicate with providers for non-urgent requests or questions.  Due to long hold times on the telephone, sending your provider a message by [facility] may be a faster and more efficient way to get a response.  Please allow 48 business hours for a response.  Please remember that this is for non-urgent requests.  _______________________________________________________ Due to recent changes in healthcare laws, you may see the results of your imaging and laboratory studies on MyChart before your provider has had a chance to review them.  We understand that in some cases there may be results that are confusing or concerning to you. Not all laboratory results come back in the same time frame and the provider may be waiting for multiple results in order to interpret others.  Please give [place] 48 hours in order for your provider to thoroughly review all the results before contacting the office for clarification of your results.   Follow up as needed.  Thank you for choosing me and [facility].  Vito Cirigliano, D.O.

## 2022-04-28 NOTE — Progress Notes (Signed)
Chief Complaint:    LLQ pain, decreased appetite, constipation  Review of previous GI testing; Dr. Benson Norway and changed to Dr. Ardis Hughs 10/2014:  -CBC, CMP, and TSH were normal in 07/2014 and again in 08/2014. CT scan02/26/2016. This was done without contrast due to dye allergy.  This showed no acute findings in the abdomen and pelvis specifically to explain her history of abdominal pain. There is a 3.0 cm left ovarian cystic lesion that is likely benign. She is following with gynecology and they're going to monitor that, but they think that it is benign.  Stomach, duodenum, small bowel, and colon grossly unremarkable.Abdominal ultrasound on 09/04/2014 showed no acute or chronic abnormalities of the hepatobiliary tree; evaluation of the pancreatic tail was limited. There is no acute abnormalities of the kidney demonstrated. A 1.6 cm diameter mid to lower pole cyst was noted in the right kidney.  August 2015 HIDA scan with CCK, which was normal.  This was performed for complaints of upper abdominal pain, appetite loss, fatigue, and weight loss at that time as well.EGD was in December 2013 at which time she was found to have moderate gastritis. Biopsies actually showed H. Pylori with chronic active gastritis, however, after taking 1 dose of the Pylera medication she discontinued it because of side effects (it made her feel horrible).  Therefore, she has not completed treatment for H. Pylori. Duodenal biopsies were unremarkable at that time.  flexible sigmoidoscopy also in December 2013 that was normal with only medium hemorrhoids noted. This was performed for diarrhea and biopsies of the descending and sigmoid colon were unremarkable. She states that she had a full colonoscopy prior to that with Dr. Benson Norway, however, after calling his office they stated they have never performed a full colonoscopy on her. Gastric emptying scan in January 2014 showed 33% of the ingested activity remaining in the stomach which is mildly  abnormal compared to the normal range of less than 30% of the ingested activity remaining at 120 minutes.  Repeat GES 09/2014 was normal.  EGD 11/2014 with Dr. Ardis Hughs showed pan-gastritis with positive Hpylori on biopsies; also small hiatal hernia.  Saw ID and was apparently treated with subsequent testing negative.  Repeat abdominal ultrasound 09/2015 was normal.  07/2016 H. Pylori stool antigen was negative. Repeat EGD 08/2016: mild gastritis, + for H pylori again. Referred to ID and tried a regimen but could not complete it.  Abdominal ultrasound 02/2021 cholelithiasis without cholecystitis, minimal central intrahepatic duct dilation and CBD 0.8 cm with normal liver.  CT A/P 06/2021 normal GI tract.  CT A/P 03/2022 moderate stool burden throughout colon, pelvic floor prolapse with small cystocele, otherwise normal   2.  Presumed ischemic colitis.  April 2020, CT scan suggested "mild circumferential wall thickening about the transverse colon, which may be related to incomplete distention or possibly mild colitis".     EGD April 2021 for generalized abdominal pain, anorexia, nausea and weight loss showed mild nonspecific gastritis.  Pathology showed mild gastritis, no H. pylori.   Colonoscopy April 2021 for generalized abdominal pain, constipation, diarrhea and weight loss showed left-sided diverticulosis but was otherwise completely normal.  HPI:     Patient is a 88 y.o. female presenting [facility] for evaluation of LLQ pain, decreased appetite, and intermittent constipation.  Last seen by Dr. Ardis Hughs on 11/14/2021.  Main issue at that time was chronic left-sided abdominal pain for at least 2-3 years, which has been extensively evaluated as above and no further diagnostic  tests recommended.  Recommended increasing MiraLAX, continued Colace, continue Bentyl.  Today, her main issue is her chronic left sided abdominal pain. Same pain as previous. Decreased appetite for many months. Weight  stable from last appt. Has Bentyl which works for lower GI sxs, but has not tried for these sxs. Has trialed FDGuard which did not work for her.   She went to the ER on 04/20/2022 with abdominal pain and both diarrhea/constipation. - H/H 15.3/47.3, otherwise normal CBC - Normal CMP, lactate.  Lipase 64 - CT A/P: Moderate stool burden throughout colon, pelvic floor prolapse with small cystocele.  Otherwise normal - Treated with Colace, MiraLAX, and recommended hydration and increase fiber  Takes Colace BID, Miralax 1-2 times/day. Can have occasaional diarrhea. Has trialed Linzess and does not want to retry that or similar agents.    Review of systems:     No chest pain, no SOB, no fevers, no urinary sx   Past Medical History:  Diagnosis Date   Bell's palsy 1966   Hx: right side facial droop, resolved per patient 04/02/19   Carotid artery disease ([place]) 2010   on vascular screening;unchanged 2013.(could not tolerate simvastatin, no other statins tried)--<30% blockage bilat 07/2011   Chronic abdominal pain    Chronic fatigue and malaise    Claustrophobia    Cyst of left ovary    last imaging 06/2021, benign, no further f/u   Depression    treated in the past for years;stopped in 2010 for a years   Duodenal ulcer 1962   h/o   Dysrhythmia    ocassional PVC's, no current problems per patient on 04/02/19   Fibromyalgia    Frequent PVCs 07/2012   Seen by  Cards: benign, asymptomatic, normal EF   Gallstones 02/2021   on [place]   GERD (gastroesophageal reflux disease)    diet controlled   Glaucoma, narrow-angle    s/p laser surgery   History of hiatal hernia    during endoscopy   Hypothyroid 03/2007   IBS (irritable bowel syndrome)    Dr. Benson Norway   Ischemic colitis ([place]) 11/21/2018   no current problems per patient on 0/6/26   Lichenoid keratosis 94/85/4627   Dr.Stinehelfer   Ocular migraine    Osteoporosis 04/2010   Dr.Hawkes; later consulted Dr. Buddy Duty (2022)   Panic attack     Parkinson disease    Parkinson's disease 06/23/2016   Recurrent UTI    has cystocele-Dr.Grewal   Shingles 1999   h/o   Superficial thrombophlebitis 03/2009   RLE   Trochanteric bursitis 12/2008   bilateral    Patient's surgical history, family medical history, social history, medications and allergies were all reviewed in Epic    Current Outpatient Medications  Medication Sig Dispense Refill   ALPRAZolam (XANAX) 0.5 MG tablet Take four tablets daily as needed for anxiety. 120 tablet 2   Carbidopa-Levodopa ER (SINEMET CR) 25-100 MG tablet controlled release TAKE ONE TABLET BY MOUTH EVERY MORNING; 1 TABLET AT NOON; AND 1 TABLET AT BEDTIME 270 tablet 1   dicyclomine (BENTYL) 10 MG capsule TAKE 1 CAPSULE BY MOUTH EVERY 6 HOURS AS NEEDED 30 capsule 6   docusate sodium (COLACE) 100 MG capsule Take 1 capsule (100 mg total) by mouth 2 (two) times daily as needed for mild constipation. 60 capsule 0   polyethylene glycol (MIRALAX) 17 g packet Take 17 g by mouth daily as needed. 14 each 0   SYNTHROID 25 MCG tablet Take 1 tablet Monday through Saturday,  skipping Sundays. 90 tablet 0   mirtazapine (REMERON) 15 MG tablet Take 1 tablet (15 mg total) by mouth at bedtime. (Patient not taking: Reported on 03/30/2022) 30 tablet 2   ZADITOR 0.025 % ophthalmic solution Place 1 drop into the left eye 2 (two) times daily.     No current facility-administered medications for this visit.    Physical Exam:     BP 110/62   Pulse 70   Ht '4\' 11"'$  (1.499 m)   Wt 93 lb (42.2 kg)   LMP  (LMP Unknown)   BMI 18.78 kg/m   GENERAL:  Pleasant female in NAD, sitting in wheelchair PSYCH: : Cooperative, normal affect CARDIAC:  RRR, no murmur heard, no peripheral edema PULM: Normal respiratory effort, lungs CTA bilaterally, no wheezing ABDOMEN: TTP over left rib cage.  Abdomen otherwise nondistended, soft, nontender.  SKIN:  turgor, no lesions seen Musculoskeletal:  Normal muscle tone, normal strength NEURO:  Alert and oriented x 3, no focal neurologic deficits   IMPRESSION and PLAN:    1) Left-sided flank pain Exam c/w MSK etiology, and not GI/abdominal.  Recent CT abdomen/pelvis only notable for retained stool, but no areas of obstruction, inflammation.  She has otherwise had an extensive work-up in the past as outlined above. - Patient does not want to trial course of NSAIDs due to concern of ulcer, bleeding, etc.  Based on age, comorbidities, this seems to be a reasonable concern/precaution - Can trial heating pad - Follow-up with PCM on Monday as scheduled - No need for additional endoscopic or imaging studies or new labs  2) IBS-C 3) Abdominal bloating - Continue Bentyl as needed - Continue Colace and MiraLAX as currently doing - Continue adequate hydration   RTC prn           Lavena Bullion ,DO, FACG 04/28/2022, 1:55 PM

## 2022-04-30 NOTE — Progress Notes (Unsigned)
No chief complaint on file.  Patient presents for ER follow-up.   She went to ER on 9/28 with complaints of abdominal pain.  Pain was in epigastrium, RUQ and radiated to her back. Lipase was elevated, but CT was unremarkable.  There was moderate stool burden in colon, and pt reported some intermittent constipation. She was given colace, recommended colace and miralax at home, as well as fluids and high fiber diet.  Considered dx of mesenteric ischemia, but lactate was WNL.  She subsequently saw GI for f/u on 10/6. At that time, she was complaining of her chronic left sided abdominal pain, decreased appetite for many months.  Per GI notes--Takes Colace BID, Miralax 1-2 times/day. Can have occasional diarrhea. Has trialed Linzess and does not want to retry that or similar agents.  GI felt that her pain was MSK (at flank/ribs), not GI/abdominal.  Recommended heating pad, pt declined NSAID. She was to cont Bentyl, Colace and Miralax (along with adequate hydration) to help with her IBS-C and bloating.  Records reviewed:   CT abdomen IMPRESSION: 1. No acute noncontrast CT findings of the abdomen or pelvis to explain left upper quadrant pain. 2. Moderate burden of stool throughout the colon with stool balls in the rectum. 3. Pelvic floor prolapse with a small cystocele. Aortic Atherosclerosis (ICD10-I70.0).   Labs from ER:  Glu 91   Chemistry      Component Value Date/Time   NA 138 04/20/2022 1022   NA 142 11/02/2021 1117   K 3.9 04/20/2022 1022   CL 100 04/20/2022 1022   CO2 29 04/20/2022 1022   BUN 16 04/20/2022 1022   BUN 12 11/02/2021 1117   CREATININE 0.68 04/20/2022 1022   CREATININE 0.75 10/22/2018 1401   CREATININE 0.67 11/23/2016 0815      Component Value Date/Time   CALCIUM 9.8 04/20/2022 1022   ALKPHOS 57 04/20/2022 1022   AST 19 04/20/2022 1022   AST 17 10/22/2018 1401   ALT 6 04/20/2022 1022   ALT 7 10/22/2018 1401   BILITOT 1.3 (H) 04/20/2022 1022   BILITOT 1.3 (H)  11/02/2021 1117   BILITOT 0.8 10/22/2018 1401     Lab Results  Component Value Date   LIPASE 64 (H) 04/20/2022   Lab Results  Component Value Date   WBC 7.7 04/20/2022   HGB 15.3 (H) 04/20/2022   HCT 47.3 (H) 04/20/2022   MCV 93.5 04/20/2022   PLT 264 04/20/2022   Lactic acid 1.4 (nl) Urine: clear, trace Hgb and leuks. SG 1.006   Pt with hypothyroidism, on synthroid.  Last TSH WNL. Other than constipation, denies changes related to thyroid--denies changes to hair/skin/nails.  Chronic decreased energy and depression.  Unchanged.   Lab Results  Component Value Date   TSH 2.180 10/06/2021          PMH, PSH, SH reviewed       ROS:  no fever, chills, URI symptoms, chest pain, shortness of breath. No nausea. Vomiting. Abdominal pain Bowels   No bleeding, bruising, rashes.     PHYSICAL EXAM:   LMP  (LMP Unknown)   Wt Readings from Last 3 Encounters:  04/28/22 93 lb (42.2 kg)  04/20/22 90 lb (40.8 kg)  03/30/22 91 lb 9.6 oz (41.5 kg)          ASSESSMENT/PLAN:     Flu shot today COVID booster and RSV discussed   Has she gotten Tdap or Shingrix from pharmacy??

## 2022-05-01 ENCOUNTER — Encounter: Payer: Self-pay | Admitting: Family Medicine

## 2022-05-01 ENCOUNTER — Ambulatory Visit (INDEPENDENT_AMBULATORY_CARE_PROVIDER_SITE_OTHER): Payer: Medicare Other | Admitting: Family Medicine

## 2022-05-01 VITALS — BP 132/74 | HR 72 | Ht 59.0 in | Wt 93.2 lb

## 2022-05-01 DIAGNOSIS — Z23 Encounter for immunization: Secondary | ICD-10-CM

## 2022-05-01 DIAGNOSIS — K5900 Constipation, unspecified: Secondary | ICD-10-CM

## 2022-05-01 DIAGNOSIS — R0789 Other chest pain: Secondary | ICD-10-CM | POA: Diagnosis not present

## 2022-05-01 DIAGNOSIS — E039 Hypothyroidism, unspecified: Secondary | ICD-10-CM

## 2022-05-01 NOTE — Patient Instructions (Addendum)
Increase Colace to 2 pills/day. Continue Miralax daily.  Continue to use whatever works for your rib pain (ice or heat), continue topical lidocaine (ie SalonPas with lidocaine) as needed.  Get a COVID booster in 2 weeks.  Get a RSV vaccine from the pharmacy (also separated by 2 weeks).  Don't forget about getting tetanus booster (Tdap) and shingles vaccine, which are also past due, and you need to get from the pharmacy.

## 2022-05-02 ENCOUNTER — Encounter: Payer: Self-pay | Admitting: Internal Medicine

## 2022-05-03 LAB — TSH: TSH: 0.951 u[IU]/mL (ref 0.450–4.500)

## 2022-05-05 ENCOUNTER — Other Ambulatory Visit: Payer: Medicare Other | Admitting: Family Medicine

## 2022-05-05 VITALS — BP 130/62 | HR 56 | Resp 18

## 2022-05-05 DIAGNOSIS — R112 Nausea with vomiting, unspecified: Secondary | ICD-10-CM | POA: Diagnosis not present

## 2022-05-05 DIAGNOSIS — R194 Change in bowel habit: Secondary | ICD-10-CM

## 2022-05-05 DIAGNOSIS — R1012 Left upper quadrant pain: Secondary | ICD-10-CM | POA: Diagnosis not present

## 2022-05-05 DIAGNOSIS — R630 Anorexia: Secondary | ICD-10-CM | POA: Diagnosis not present

## 2022-05-05 NOTE — Progress Notes (Signed)
[facility] Consult Note Telephone: [phone]  Fax: [phone]    Date of encounter: 05/05/22 2:16 PM PATIENT NAME: Tracy Malone [place] [id number]   [phone] (home)  DOB: 05-11-1932 MRN: [id number] PRIMARY CARE PROVIDER:    [name], MD,  [place] [address] [phone]  REFERRING PROVIDER:   [name], [place],   [address] [phone]  RESPONSIBLE PARTY:    Contact Information     Name Relation Home Work Mobile   Winfield Spouse [phone]     Ned Card Daughter [phone]  [phone]        I met face to face with patient and spouse in their home. Palliative Care was asked to follow this patient by consultation request of  [name], MD to address advance care planning and complex medical decision making. This is a follow up visit.                           ASSESSMENT , SYMPTOM MANAGEMENT AND PLAN / RECOMMENDATIONS:  Abd pain (LUQ) Refers across abdomen and up into both shoulders. Recommend repeat measurement of lipase, LFTs and CBC with possible HIDA scan to check gallbladder EF.   2.  Nausea/vomiting/altered bowel habits/poor appetite Likely multifactorial including anxiety and depression, minimal activity, poor fluid intake, constipation (as noted on most recent CT) Noted stool balls in rectum with possible constipation contributing to bloating, pain and early satiety. Recommend Miralax 1 capful daily. Has some nausea but if peristalsis slow, needs to discuss with Neurologist if Reglan contraindicated with PD Also has simple left ovarian cyst which has been stable but may be source of pain/nausea and altered bowel habits-recommend follow up with GYN vs TV pelvic US to further characterize and/or CEA 125 given persistent cyst close to 3 cm in size on post menopausal woman for greater than 3 years.  Advance Care  Planning/Goals of Care: Goals include to maximize quality of life and symptom management.   CODE STATUS: MOST as of 11/02/21: Attempt CPR with limited additional interventions, do not use intubation or mechanical ventilation Antibiotics if indicated IV fluids and feeding tube for a defined trial period    Follow up Palliative Care Visit: Palliative care will continue to follow for complex medical decision making, advance care planning, and clarification of goals. Return 4 weeks or prn.    This visit was coded based on medical decision making (MDM).  PPS: 60%  HOSPICE ELIGIBILITY/DIAGNOSIS: TBD  Chief Complaint:  Palliative Care is continuing to follow patient for chronic medical management in setting of Parkinson's disease with acute abdominal pain currently.  HISTORY OF PRESENT ILLNESS:  Tracy Malone is a 88 y.o. year old female with Parkinson's Disease, osteopenia, lumbar compression fracture, aortic atherosclerosis, GERD, IBS, functional dyspepsia, hx of colitis and ischemic colitis, chronic lymphocytic thyroiditis, cystocele, early satiety, depression with anxiety, low back pain with sciatica, bilateral leg pain and hx of trochanteric bursitis. She is independent with bathing, dressing and all ADLs.  She woke up during the night with burning pain in her upper abdomen that refers to her bilateral shoulders.  States using an ice pack will help with pain.  Pt is seeing her Neurologist Dr Felecia Shelling on Tuesday. Having some bloating sensation after eating.  Pain appears to be sharp and colicky in nature, making it difficult to remain still.  Has a hx of ischemic colitis, H  pylori infection and GB sludge with layering gallstones and borderline biliary dilatation (02/2021).  Most recent CT 04/20/22 did not show gallstones but pt had elevated total bilirubin 1.3, lipase 64.  PPI does not seem to improve pain, has noted improvement with ice.  Denies hematemesis or blood in stool. States it feels like  she can't take a good deep breath. Does not necessarily increase after eating. Has in home help for herself and spouse through [place].   History obtained from review of EMR, discussion with spouse and/or Ms. Mullarkey.      Latest Ref Rng & Units 04/20/2022   10:22 AM 09/23/2021    1:18 PM 06/14/2021   10:29 AM  CBC  WBC 4.0 - 10.5 K/uL 7.7  6.1  7.7   Hemoglobin 12.0 - 15.0 g/dL 15.3  12.8  14.3   Hematocrit 36.0 - 46.0 % 47.3  38.5  43.1   Platelets 150 - 400 K/uL 264  261.0  348.0        Latest Ref Rng & Units 04/20/2022   10:22 AM 11/02/2021   11:17 AM 09/23/2021    1:18 PM  CMP  Glucose 70 - 99 mg/dL 91  89  147   BUN 8 - 23 mg/dL _0 Creatinine 0.44 - 1.00 mg/dL 0.68  0.64  0.63   Sodium 135 - 145 mmol/L 138  142  141   Potassium 3.5 - 5.1 mmol/L 3.9  4.1  3.5   Chloride 98 - 111 mmol/L 100  104  104   CO2 22 - 32 mmol/L _1 Calcium 8.9 - 10.3 mg/dL 9.8  10.0  9.4   Total Protein 6.5 - 8.1 g/dL 7.3  6.4    Total Bilirubin 0.3 - 1.2 mg/dL 1.3  1.3    Alkaline Phos 38 - 126 U/L 57  69    AST 15 - 41 U/L 19  23    ALT 0 - 44 U/L 6  4     Lipase     Component Value Date/Time   LIPASE 64 (H) 04/20/2022 1022    CT abd pelvis wo contrast 03/2822: IMPRESSION: 1. No acute noncontrast CT findings of the abdomen or pelvis to explain left upper quadrant pain. 2. Moderate burden of stool throughout the colon with stool balls in the rectum. 3. Pelvic floor prolapse with a small cystocele.    Aortic Atherosclerosis (ICD10-I70.0).   03/07/21 CT Abd/pelvis without contrast: IMPRESSION: 1. No evidence of acute diverticulitis or other acute pathology in the abdomen or pelvis to explain the patient's pain. 2. Unchanged left adnexal cyst. Follow-up pelvic ultrasound in 1 year could be considered depending on patient goals of care and comorbidities. 3. Sludge and/or small stones in the gallbladder without evidence of acute cholecystitis. 4.  Aortic  Atherosclerosis (ICD10-I70.0).  03/07/21 Limited abd [place]: FINDINGS: Gallbladder:   Layering gallstones and biliary sludge within a minimally-distended gallbladder. No gallbladder wall thickening visualized. No sonographic Murphy sign noted by sonographer.   Common bile duct:   Diameter: Near the upper limit of normal in size, at 0.8 cm. Minimal central intrahepatic biliary ductal dilation.   Liver:   No focal lesion identified. Within normal limits in parenchymal echogenicity. Portal vein is patent on color Doppler imaging with normal direction of blood flow towards the liver.   Other: No perihepatic ascites   IMPRESSION: 1. Cholelithiasis without sonographic evidence of acute cholecystitis. 2. Minimal  intrahepatic biliary ductal dilation, and CBD near the upper limit of normal in size. Correlate with obstructive enzymes.  I reviewed EMR for available labs, medications, imaging, studies and related documents and there were none since the last visit.   ROS General: NAD EENT:  No scleral injection, no current drainage or crusting of eyelashes, no visible laceration or foreign body noted in left eye.   Cardiovascular: denies chest pain, denies DOE Pulmonary: denies cough, denies increased SOB Abdomen: endorses "poor" appetite, denies constipation, endorses continence of bowel GU: denies dysuria, endorses continence of urine with rare urge incontinence MSK:  denies weakness after significant exertion, chronic low back pain and sciatic pain  no falls reported Neuro:  Low back pain, abd pain improved some with ice Psych: Endorses depressed and anxious mood  Physical Exam: Current and past weights: currently weight is  93 lbs 3.2 ounces as of 05/01/22,  91 lbs 9.6 ounces as of 03/30/22, prior weight was 97.4 lbs  Constitutional: NAD, appears uncomfortable General: thin, WD  CV: S1S2, Occasionally irregular, no LE edema Pulmonary: CTAB, no increased work of breathing, no cough, room  air Abdomen:  normo-active BS + 4 quadrants, soft, + Murphy's sign MSK: no sarcopenia, moves all extremities, ambulatory Neuro:  no generalized weakness, no cognitive impairment, noted tremor worse in right hand Psych: moderately anxious affect, A and O x 3 Hem/lymph/immuno: no widespread bruising   Thank you for the opportunity to participate in the care of Ms. Mcginty.  The palliative care team will continue to follow. Please call our office at [phone] if we can be of additional assistance.   Marijo Conception, FNP -C  COVID-19 PATIENT SCREENING TOOL Asked and negative response unless otherwise noted:   Have you had symptoms of covid, tested positive or been in contact with someone with symptoms/positive test in the past 5-10 days?  No

## 2022-05-09 ENCOUNTER — Encounter: Payer: Self-pay | Admitting: Neurology

## 2022-05-09 ENCOUNTER — Ambulatory Visit (INDEPENDENT_AMBULATORY_CARE_PROVIDER_SITE_OTHER): Payer: Medicare Other | Admitting: Neurology

## 2022-05-09 VITALS — BP 150/80 | HR 73 | Ht 59.0 in | Wt 90.5 lb

## 2022-05-09 DIAGNOSIS — R269 Unspecified abnormalities of gait and mobility: Secondary | ICD-10-CM | POA: Diagnosis not present

## 2022-05-09 DIAGNOSIS — G250 Essential tremor: Secondary | ICD-10-CM | POA: Diagnosis not present

## 2022-05-09 DIAGNOSIS — G20A1 Parkinson's disease without dyskinesia, without mention of fluctuations: Secondary | ICD-10-CM | POA: Diagnosis not present

## 2022-05-09 DIAGNOSIS — F418 Other specified anxiety disorders: Secondary | ICD-10-CM | POA: Diagnosis not present

## 2022-05-09 DIAGNOSIS — R630 Anorexia: Secondary | ICD-10-CM

## 2022-05-09 DIAGNOSIS — R634 Abnormal weight loss: Secondary | ICD-10-CM | POA: Diagnosis not present

## 2022-05-09 DIAGNOSIS — R194 Change in bowel habit: Secondary | ICD-10-CM | POA: Diagnosis not present

## 2022-05-09 MED ORDER — FLUOXETINE HCL 10 MG PO CAPS: ORAL_CAPSULE | ORAL | 11 refills | Status: DC

## 2022-05-09 MED ORDER — CARBIDOPA-LEVODOPA ER 25-100 MG PO TBCR: EXTENDED_RELEASE_TABLET | ORAL | 3 refills | Status: DC

## 2022-05-09 NOTE — Progress Notes (Signed)
[facility]  PATIENT: Tracy Malone DOB: 11/18/1931  REFERRING DOCTOR OR PCP:  Joselyn Arrow SOURCE: patient and EMR records  _________________________________   HISTORICAL  CHIEF COMPLAINT:  Chief Complaint  Patient presents with   Follow-up    RM 2. Last seen 12/14/21.  Ambulates with cane, no falls. Has had a loss of appetite, fatigued, irritable, agitated, crying.     HISTORY OF PRESENT ILLNESS:  Tracy Malone is a 88 y.o.  woman with PD (vs PD/BET), anxiety/depression and history of L1 fracture.    Update 05/09/2022: She is feeling worse.  She notes reduced appetite and she has lost some weight.  She has constipation. She is taking Sinemet 9 am, 1 pm, 5 pm.    She feels tremor is about the same    She notes it while resting but gets worse when she writes or if she gets upset.. It is also worse when anxious.  She has more depression and cries a lot.    Her tremor has been difficult to characterize and she appears to have a PD/BET overlap.    On Sinemet, her gait improved but the tremor did not change much.  Alprazolam has helped her tremor more.  She had a lot of benefit taking propranolol for the tremor but she had hypotension and needed to stop.  She is able to tolerate metoprolol a little better though it is less effective.  Her handwriting is poor.   Tremor is at rest and with intention.       She is taking Sinemet 25/100 tid and tolerates it well She takes every 4-5 hours while awake.    She is not having GI issues.  She also has GERD and we discussed that medicatoins for it may slow Sinemet absorption.   She continues to report pain in the left buttock/hip.   Soma had helped her some in past and she is asking about that.     She is doing PT but is not sure if it is helping     .    She has anxiety that has been a long-term issue.   She has depression with occasional crying.  She is not sure how much alprazolam helps.  She is on low-dose Prozac 10 mg.   Additionally for anxiety she is on alprazolam.  She fractured the L1 vertebrae September 2020 while moving a mattress and had kyphoplasty 04/03/2019.   She has also had shots in her back (nerve blocks and ESI) and PT.  She feels PT helped more than the shots..      She had lumbar surgery (Dr. Shon Baton) recently and continues to have pain (back and leg ito the thigh).     Tremor History:      She first noted a right hand tremor in late 2015.    She notes the tremor in the right hand much more than the left.   It worsened with intention but also present at rest   Tremor is not present when asleep.    She has not noted the tremor in the head.   Initially, she did not have any difficulty with her gait or balance.   There is no family history of tremors.   Alprazolam seemed to help the tremor.   Propranolol helped the tremor quite a bit but was poorly tolerated.   She started to have more gait issues around 2019   Sinemet seemed to help the gait but not the tremor.  REVIEW OF SYSTEMS: Constitutional: No fevers, chills, sweats, or change in appetite Eyes: No visual changes, double vision, eye pain Ear, nose and throat: No hearing loss, ear pain, nasal congestion, sore throat Cardiovascular: No chest pain, palpitations.  Has had palpitations Respiratory:  No shortness of breath at rest or with exertion.   No wheezes GastrointestinaI: No nausea, vomiting, diarrhea, abdominal pain, fecal incontinence Genitourinary:  No dysuria, urinary retention or frequency.  No nocturia. Musculoskeletal:  No neck pain, back pain.  She notes right shoulder pain Integumentary: No rash, pruritus, skin lesions Neurological: as above Psychiatric: Some crying spells..  Some anxiety Endocrine: No palpitations, diaphoresis, change in appetite, change in weigh or increased thirst Hematologic/Lymphatic:  No anemia, purpura, petechiae. Allergic/Immunologic: No itchy/runny eyes, nasal congestion, recent allergic reactions,  rashes  ALLERGIES: Allergies  Allergen Reactions   Iodinated Contrast Media Anaphylaxis   Iodine Anaphylaxis    IV and topical forms. Other reaction(s): Unknown   Levsin [Hyoscyamine Sulfate]     Vision problems/pt has glaucoma   Salmon [Fish Allergy] Hives and Shortness Of Breath   Shellfish Allergy Anaphylaxis   Tramadol Swelling   Remeron [Mirtazapine] Other (See Comments)    Cause blurred vision and red eyes, pt has glaucoma   Aspirin Other (See Comments)    Sever stomach pain due to ulcer scaring.   Bis Subcit-Metronid-Tetracyc Swelling    Tongue swelling. Face tingling Other reaction(s): Unknown   Cephalexin Hives    Other reaction(s): hives   Ciprofloxacin Diarrhea   Codeine Nausea And Vomiting   Cyclobenzaprine Other (See Comments)    Tingly/prickly sensation. Other reaction(s): tingly/prickly sensation   Darvocet [Propoxyphene N-Acetaminophen] Nausea And Vomiting   Demerol [Meperidine] Nausea Only   Dexlansoprazole Swelling    Redness, swelling and peeling of both feet. Other reaction(s): foot pain   Diphedryl [Diphenhydramine] Other (See Comments)    Increased pulse/small amount ok   Doxycycline Hyclate Other (See Comments)    GI intolerance.   Doxycycline Hyclate     Other reaction(s): GI intolerance   Epinephrine Other (See Comments)    Breathing problems Other reaction(s): breathing problems/fainting   Erythromycin Other (See Comments)    GI intolerance. Other reaction(s): GI   Fish Oil     Other reaction(s): breathing problems/hives   Hyoscyamine     Other reaction(s): eye pain   Latex Other (See Comments)    Gloves ok.  Skin gets red from elastic in underwear and latex bandaides.   Meperidine Hcl     Other reaction(s): vomiting   Nitrofurantoin Diarrhea   Other     Other reaction(s): migraines Other reaction(s): Unknown Other reaction(s): Unknown Other reaction(s): Unknown Other reaction(s): increased pulse, faint, diarrhea   Prednisone Other  (See Comments)    Headache Other reaction(s): headache   Ra Diphedryl Allergy [Diphenhydramine Hcl]     Other reaction(s): increased pulse small dose okay   Sertraline Hcl Swelling and Other (See Comments)    Migraine Swelling of tongue/lip (09/2012) Other reaction(s): Unknown   Shellfish-Derived Products     Other reaction(s): Unknown   Sulfa Antibiotics Other (See Comments)    Increased pulse, fainting, diarrhea, thrush   Wellbutrin [Bupropion] Other (See Comments)    Headaches   Xylocaine [Lidocaine Hcl]     With epinephrine, given by dentist.  Speeded up heart rate and she passed out (occured twice, at dentist)   Xylocaine [Lidocaine]     Other reaction(s): Unknown   Biaxin [Clarithromycin] Rash    Started after  completing 10 day course of 2000 mg /day, Lips swelling   Ibuprofen Other (See Comments)    Motrin ok with a GI effect. Other reaction(s): rash Motrin okay with a GI effect    HOME MEDICATIONS:  Current Outpatient Medications:    ALPRAZolam (XANAX) 0.5 MG tablet, Take four tablets daily as needed for anxiety., Disp: 120 tablet, Rfl: 2   Carbidopa-Levodopa ER (SINEMET CR) 25-100 MG tablet controlled release, TAKE ONE TABLET BY MOUTH EVERY MORNING; 1 TABLET AT NOON; AND 1 TABLET AT BEDTIME, Disp: 270 tablet, Rfl: 1   dicyclomine (BENTYL) 10 MG capsule, TAKE 1 CAPSULE BY MOUTH EVERY 6 HOURS AS NEEDED, Disp: 30 capsule, Rfl: 6   docusate sodium (COLACE) 100 MG capsule, Take 1 capsule (100 mg total) by mouth 2 (two) times daily as needed for mild constipation., Disp: 60 capsule, Rfl: 0   FLUoxetine (PROZAC) 10 MG capsule, Take 1 capsule every day by oral route., Disp: , Rfl:    polyethylene glycol (MIRALAX) 17 g packet, Take 17 g by mouth daily as needed., Disp: 14 each, Rfl: 0   SYNTHROID 25 MCG tablet, Take 1 tablet Monday through Saturday, skipping Sundays., Disp: 90 tablet, Rfl: 0  PAST MEDICAL HISTORY: Past Medical History:  Diagnosis Date   Bell's palsy 1966   Hx:  right side facial droop, resolved per patient 04/02/19   Carotid artery disease (HCC) 2010   on vascular screening;unchanged 2013.(could not tolerate simvastatin, no other statins tried)--<30% blockage bilat 07/2011   Chronic abdominal pain    Chronic fatigue and malaise    Claustrophobia    Cyst of left ovary    last imaging 06/2021, benign, no further f/u   Depression    treated in the past for years;stopped in 2010 for a years   Duodenal ulcer 1962   h/o   Dysrhythmia    ocassional PVC's, no current problems per patient on 04/02/19   Fibromyalgia    Frequent PVCs 07/2012   Seen by [facility]: benign, asymptomatic, normal EF   Gallstones 02/2021   on [place]   GERD (gastroesophageal reflux disease)    diet controlled   Glaucoma, narrow-angle    s/p laser surgery   History of hiatal hernia    during endoscopy   Hypothyroid 03/2007   IBS (irritable bowel syndrome)    Dr. Elnoria Howard   Ischemic colitis (HCC) 11/21/2018   no current problems per patient on 04/02/19   Lichenoid keratosis 12/03/2020   Dr.Stinehelfer   Ocular migraine    Osteoporosis 04/2010   Dr.Hawkes; later consulted Dr. Sharl Ma (2022)   Panic attack    Parkinson disease    Parkinson's disease 06/23/2016   Recurrent UTI    has cystocele-Dr.Grewal   Shingles 1999   h/o   Superficial thrombophlebitis 03/2009   RLE   Trochanteric bursitis 12/2008   bilateral    PAST SURGICAL HISTORY: Past Surgical History:  Procedure Laterality Date   ABDOMINAL HYSTERECTOMY     BACK SURGERY  05/2019   CATARACT EXTRACTION, BILATERAL  1995, 1996   EYE SURGERY Bilateral    laser - glaucoma   Flexible sigmoidoscopy     KYPHOPLASTY N/A 04/03/2019   Procedure: KYPHOPLASTY L1;  Surgeon: Venita Lick, MD;  Location: [facility] OR;  Service: Orthopedics;  Laterality: N/A;  90 mins   THYROIDECTOMY, PARTIAL  09/2005   L nodule; Dr. Gerrit Friends   TONSILLECTOMY  1946   UPPER GI ENDOSCOPY  06/27/2012   VAGINAL HYSTERECTOMY  1971  and bladder repair.   Still has ovaries   WISDOM TOOTH EXTRACTION      FAMILY HISTORY: Family History  Problem Relation Age of Onset   Heart disease Mother    Hypertension Mother    Hypertension Sister    HIV Son    Heart disease Brother    Lung cancer Brother        lung   Diabetes Maternal Grandfather    Diabetes Granddaughter        type 1   Colon cancer Neg Hx    Esophageal cancer Neg Hx    Prostate cancer Neg Hx    Stomach cancer Neg Hx    Rectal cancer Neg Hx     SOCIAL HISTORY:  Social History   Socioeconomic History   Marital status: Married    Spouse name: Chrissie Noa   Number of children: 2   Years of education: Not on file   Highest education level: Not on file  Occupational History   Occupation: retired (school system)  Tobacco Use   Smoking status: Never   Smokeless tobacco: Never  Vaping Use   Vaping Use: Never used  Substance and Sexual Activity   Alcohol use: No    Alcohol/week: 0.0 standard drinks of alcohol   Drug use: No   Sexual activity: Not Currently    Partners: Male    Birth control/protection: Other-see comments    Comment: Hysterectomy  Other Topics Concern   Not on file  Social History Narrative   Married.  Son lives in [place]; Daughter [name] lives in [place]; 2 grandchildren   Social Determinants of Health   Financial Resource Strain: Low Risk  (05/05/2021)   Overall Financial Resource Strain (CARDIA)    Difficulty of Paying Living Expenses: Not hard at all  Food Insecurity: Not on file  Transportation Needs: No Transportation Needs (05/05/2021)   PRAPARE - Administrator, Civil Service (Medical): No    Lack of Transportation (Non-Medical): No  Physical Activity: Not on file  Stress: Not on file  Social Connections: Not on file  Intimate Partner Violence: Not on file     PHYSICAL EXAM  Vitals:   05/09/22 1505  BP: (!) 150/80  Pulse: 73  Weight: 90 lb 8 oz (41.1 kg)  Height: 4\' 11"  (1.499 m)    Body mass index is 18.28  kg/m.   General: The patient is well-developed and well-nourished and in no acute distress  Skin:   No edema.    There were a couple small subcutaneous nodules in the right arm. .    Neurologic Exam  Mental status: The patient is alert and oriented x 3 at the time of the examination. The patient has apparent normal recent and remote memory, with an apparently normal attention span and concentration ability.   Speech is normal.  Cranial nerves: Extraocular movements are full.   Facial strength is normal.  Trapezius and sternocleidomastoid strength is normal. No dysarthria is noted.    Motor: She has mild bradykinesia.  There is a 5-6 Hz tremor on the right > left  hand.  It is worse during rest and also increases with intention.  Talking about it worsened it.   Mild cogwheeling.   Muscle bulk is normal.   Strength is  5 / 5 in all 4 extremities.   Sensory: Sensory testing is intact to touch and vibration sensation in all 4 extremities.  Coordination: Cerebellar testing reveals good finger-nose-finger bilaterally.  Gait and  station: Station is normal.   The gait has a mildly reduced stride, and normal arm swing.  Able to walk withou a cane  She was able to turn 180 degrees in 4 steps Romberg is negative.  Reflexes: Deep tendon reflexes are symmetric and normal bilaterally.    DIAGNOSTIC DATA (LABS, IMAGING, TESTING) - I reviewed patient records, labs, notes, testing and imaging myself where available.  Lab Results  Component Value Date   WBC 7.7 04/20/2022   HGB 15.3 (H) 04/20/2022   HCT 47.3 (H) 04/20/2022   MCV 93.5 04/20/2022   PLT 264 04/20/2022      Component Value Date/Time   NA 138 04/20/2022 1022   NA 142 11/02/2021 1117   K 3.9 04/20/2022 1022   CL 100 04/20/2022 1022   CO2 29 04/20/2022 1022   GLUCOSE 91 04/20/2022 1022   BUN 16 04/20/2022 1022   BUN 12 11/02/2021 1117   CREATININE 0.68 04/20/2022 1022   CREATININE 0.75 10/22/2018 1401   CREATININE 0.67  11/23/2016 0815   CALCIUM 9.8 04/20/2022 1022   PROT 7.3 04/20/2022 1022   PROT 6.4 11/02/2021 1117   ALBUMIN 4.3 04/20/2022 1022   ALBUMIN 4.4 11/02/2021 1117   AST 19 04/20/2022 1022   AST 17 10/22/2018 1401   ALT 6 04/20/2022 1022   ALT 7 10/22/2018 1401   ALKPHOS 57 04/20/2022 1022   BILITOT 1.3 (H) 04/20/2022 1022   BILITOT 1.3 (H) 11/02/2021 1117   BILITOT 0.8 10/22/2018 1401   GFRNONAA >60 04/20/2022 1022   GFRNONAA >60 10/22/2018 1401   GFRAA >60 03/16/2020 1153   GFRAA >60 10/22/2018 1401   Lab Results  Component Value Date   CHOL 198 03/10/2021   HDL 66 03/10/2021   LDLCALC 112 (H) 03/10/2021   TRIG 111 03/10/2021   CHOLHDL 3.0 03/10/2021   Lab Results  Component Value Date   HGBA1C 5.3 10/07/2020   No results found for: "VITAMINB12" Lab Results  Component Value Date   TSH 0.951 05/01/2022       ASSESSMENT AND PLAN    1. Parkinson's disease without dyskinesia, unspecified whether manifestations fluctuate   2. Essential tremor   3. Gait disturbance   4. Depression with anxiety   5. Loss of weight   6. Poor appetite   7. Altered bowel habits       1.    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  continue Sinemet CR 25/100 3 pills a day  Continue alprazolam (written by psychiatry) which will help BET some 2.   Advised to use walker or cane for safety 3.    We discussed referral to a movement disorder specialist at [facility] fi symptoms worsen.   4.     If more weight loss, she should discuss with PCP, consider Megace.   5.   She will return to see me in 6 months or as needed if there are new or worsening neurologic symptoms.   Lashandra Arauz A. Epimenio Foot, MD, PhD 05/09/2022, 3:49 PM Certified in Neurology, Clinical Neurophysiology, Sleep Medicine, Pain Medicine and Neuroimaging  [facility] [address], [address] [place], [place] [address] [phone]

## 2022-05-10 ENCOUNTER — Encounter: Payer: Self-pay | Admitting: Neurology

## 2022-05-10 ENCOUNTER — Telehealth: Payer: Self-pay | Admitting: Family Medicine

## 2022-05-10 NOTE — Telephone Encounter (Signed)
Pt called in and states she went to see her neuro doctor and he is concerned with her weight loss. She says she does not have an appetite and he mentioned a medication to help her have an appetite and gain weight and advised her to reach out to her primary doctor.   I did offer her to come in for an appointment but she wanted me to send back a message to you first.

## 2022-05-10 NOTE — Telephone Encounter (Signed)
Advise pt that I read neuro note, and saw him possibly mention Megace to help with appetite. She would need a visit to discuss potential side effects from this medication prior to starting. A lot of her decreased appetite is related to her depression/anxiety. At one point I thought we discussed remeron, for her to discuss with her psychiatrist, as that could help with appetite and depression.   I don't see that on her med list, not sure if she ever started it or tried it.  Happy to discuss with her further at visit to sort these things out with her, but I'm not just going to send in the prescription based on a recommendation from neurologist without discussing it, given her reactions to medications.

## 2022-05-10 NOTE — Telephone Encounter (Signed)
Spoke with patient and went over your recommendations. She will be coming in next Thursday @ 11:15 to discuss. She has taken Remeron in the past, it gave her neck pain and a terrible and would not be open to taking again.

## 2022-05-11 ENCOUNTER — Other Ambulatory Visit: Payer: Self-pay | Admitting: Neurology

## 2022-05-11 MED ORDER — MEGESTROL ACETATE 40 MG PO TABS: 40.0000 mg | ORAL_TABLET | Freq: Two times a day (BID) | ORAL | 5 refills | Status: DC

## 2022-05-14 ENCOUNTER — Encounter: Payer: Self-pay | Admitting: Family Medicine

## 2022-05-15 ENCOUNTER — Ambulatory Visit (INDEPENDENT_AMBULATORY_CARE_PROVIDER_SITE_OTHER): Payer: Medicare Other | Admitting: Psychiatry

## 2022-05-15 DIAGNOSIS — F331 Major depressive disorder, recurrent, moderate: Secondary | ICD-10-CM

## 2022-05-15 NOTE — Progress Notes (Signed)
[facility] Counselor/Therapist Progress Note  Patient ID: Tracy Malone, MRN: [id number],    Date: 05/15/2022  Time Spent:  55 minutes   Virtual Visit via Telehealth Note: Telephone only; Patient not able to do video Connected with patient by a telemedicine/telehealth application, with their informed consent, and verified patient privacy and that I am speaking with the correct person using two identifiers. I discussed the limitations, risks, security and privacy concerns of performing psychotherapy and the availability of in person appointments. I also discussed with the patient that there may be a patient responsible charge related to this service. The patient expressed understanding and agreed to proceed. I discussed the treatment planning with the patient. The patient was provided an opportunity to ask questions and all were answered. The patient agreed with the plan and demonstrated an understanding of the instructions. The patient was advised to call  our office if  symptoms worsen or feel they are in a crisis state and need immediate contact.   Therapist Location: office Patient Location: home  Treatment Type: Individual Therapy  Reported Symptoms:  depression, anxiety, "everything", denies any SI  Mental Status Exam:  Appearance:   N/a   telehealth      Behavior:  Sharing and difficulty with motivation  Motor:  Normal  Speech/Language:   Clear and Coherent  Affect:  N/a    telehealth  Mood:  anxious, depressed, and sad  Thought process:  Some tangentiality  Thought content:    Rumination  Sensory/Perceptual disturbances:    WNL  Orientation:  oriented to person, place, time/date, situation, day of week, month of year, year, and stated date of Oct. 23, 2023  Attention:  Fair  Concentration:  Fair  Memory:  Quite good for her age  Fund of knowledge:   Good  Insight:    Good and Fair  Judgment:   Good  Impulse Control:  Good and Fair   Risk Assessment: Danger to  Self:  No Self-injurious Behavior: No Danger to Others: No Duty to Warn:no Physical Aggression / Violence:No  Access to Firearms a concern: No  Gang Involvement:No   Subjective:   Patient reporting depression, anxiety, "can't control everything", appetite decreased, tearfulness, mostly related to her own health, husband's health decline, and not being able to see her son for another month. Tearfully processed her thoughts/feelings on each of these issues. Noticing more of her physical decline and also her husband's physical decline. Their current ages are 8 and 55. They both do have some issues but also some positives in their lives, which patient will acknowledge briefly but quickly revert back to the negatives, and admits she has a long time "habit of this". Husband has tried to get her to focus more on the positives also. Worked on this with patient today, first by giving her some time to focus on negatives, and then encouraged her to also look at some positives in her life. Worked on making a list of those positives and encouraged her to post it at home in a place she sees often, making it a practice to reflect on it often as she admitted she needed reminders. Encouraged her getting outside a little more as she is able, be in touch with others that care about her (family, son, church, neighbors). Am concerned that her tendency to overly focus on "what might go wrong versus right" may push some people away and limit her ability to get support, and tried to discuss this with  her in ways that would not be offensive to her.Tearfulness did end before session end and she was able to state a couple of things she is looking forward to in near future.  Encourage to remain on meds as prescribed and reinforced her need to notice the positives as well as the negatives, and to try getting out more this week.  Interventions: Cognitive Behavioral Therapy and Ego-Supportive  Long term goal: Develop the ability to  recognize, accept, and cope with feelings of anxiety and depression. Short term goal: Verbalize any unresolved grief issues that may be contributing to anxiety and depression. Strategy: Replace negative self-defeating self talk with verbalization of realistic and positive cognitive messages to lessen depression/anxiety and improve mood.   Diagnosis:   ICD-10-CM   1. Major depressive disorder, recurrent episode, moderate (HCC)  F33.1      Plan: Patient today showing good participation and some motivation although tending to overly focus on negatives vs positives.  She worked well on this today and stuck with some of the strategies longer than she normally is able.  Her tearfulness was strong during parts of the session but leveled out and stopped altogether before end of session.  Did seem to feel relieved after having vented her anxious and depressed thoughts.  Encouraged patient to frequently review the list that she worked on in session today of her positives to help her stay more centered.  Was talking and more hopeful ways towards end of session.  Did support her as she talked about her fears currently and into the future as she and her husband grow older and looked at some healthy habits for both of them that can support positive health.  Encouraged patient in her practice of positive behaviors as noted in session including: Positive self talk and self-care, participating in activities as she is able and has previously enjoyed including reading/phone calls with friends, reaching out to her church that has been supportive of her, listening to music which she finds emotionally calming, reading, trying to see more positives versus negatives, allow for good sleep patterns, stay in the present focusing on what she can control her change, remain in contact with family members and friends who are supportive, and realize the strength she shows when working with goal-directed behaviors to move in a  direction that supports her improved emotional health and overall wellbeing.  Goal review and progress/challenges noted with patient.  Next appointment within approximately 3 weeks.  This record has been created using [employer].  Chart creation errors have been sought, but may not always have been located and corrected.  Such creation errors do not reflect on the standard of medical care provided.   Shanon Ace, LCSW

## 2022-05-17 NOTE — Progress Notes (Signed)
Chief Complaint  Patient presents with   problems eating    Problems eating and having stomach pains, having to burp a lot., doesn't eating a lot. Sometimes throat will hurt.  All symptoms going on about 2-3 weeks   Patient presents to discuss medication to help with appetite. She had seen [name] on 10/17.   His note said "If more weight loss, she should discuss with PCP, consider Megace." She had called the office requesting the medication, and was advised to schedule visit. Apparently she messaged [name] back, and he sent in the prescription for her on 10/19 ('40mg'$  BID). She never picked it up. She read about it, and didn't want to try it.  Depression and anxiety contributes significantly to her decrease in appetite and weight. I had previously suggested that she speak with her psychiatrist about Remeron. She states that she did try this, it caused neck pain and posterior headache after taking it for a few days. HA went away when she stopped taking it. She wouldn't be open to taking it again.  She reports she has no appetite, no desire to eat. Eating less than normal.  Her husband states she will snack on crackers, cookies, waffles. She denies eating cookies or these snacks.  She reports eating eggs, oatmeal. Has cottage cheese and yogurt as snacks.  She reports not really eating much dinner.  She is complaining that she has been belching for a couple of weeks. The belching is new, but her stomach pain is ongoing, unchanged.  Not eating much, otherwise no change in diet. She hasn't taken any omeprazole in a "long" time.  She is also complaining of 5 days of tongue soreness, reports it looks white in the morning, feels swollen.  She was seen 9/7 with similar complaints--aphthous ulcers noted on upper palate at that time, tongue was normal.  Denies any sores in her mouth currently, just tongue feeling sore.   She is complaining of a raspy throat when she wakes up in the morning, lasts all  day, but worse at the end of the day. Denies sniffling, sneezing, runny nose or cough.  She is complaining about an itchy scalp.  She washes her hair once a week.  Tried using Head and Shoulder shampoo weekly, didn't help. She discussed this with her dermatologist, who prescribed something (a liquid ) that she used twice daily.  She felt that it made it worse, stopped taking it She reports her hair is falling out. She had previously mentioned this, so thyroid had been rechecked. Lab Results  Component Value Date   TSH 0.951 05/01/2022    PMH, PSH, SH reviewed  Outpatient Encounter Medications as of 05/18/2022  Medication Sig Note   ALPRAZolam (XANAX) 0.5 MG tablet Take four tablets daily as needed for anxiety.    Carbidopa-Levodopa ER (SINEMET CR) 25-100 MG tablet controlled release TAKE ONE TABLET THREE TIMES DAILY    dicyclomine (BENTYL) 10 MG capsule TAKE 1 CAPSULE BY MOUTH EVERY 6 HOURS AS NEEDED    docusate sodium (COLACE) 100 MG capsule Take 1 capsule (100 mg total) by mouth 2 (two) times daily as needed for mild constipation. 05/01/2022: Only taking once daily   FLUoxetine (PROZAC) 10 MG capsule Take 1 or 2 capsules every day by oral route.    omeprazole (PRILOSEC) 20 MG capsule Take 1 capsule (20 mg total) by mouth daily. Take before a meal    polyethylene glycol (MIRALAX) 17 g packet Take 17 g by mouth  daily as needed. 05/01/2022: Using daily   SYNTHROID 25 MCG tablet Take 1 tablet Monday through Saturday, skipping Sundays.    [DISCONTINUED] megestrol (MEGACE) 40 MG tablet Take 1 tablet (40 mg total) by mouth 2 (two) times daily.    No facility-administered encounter medications on file as of 05/18/2022.   Allergies  Allergen Reactions   Iodinated Contrast Media Anaphylaxis   Iodine Anaphylaxis    IV and topical forms. Other reaction(s): Unknown   Levsin [Hyoscyamine Sulfate]     Vision problems/pt has glaucoma   Salmon [Fish Allergy] Hives and Shortness Of Breath    Shellfish Allergy Anaphylaxis   Tramadol Swelling   Remeron [Mirtazapine] Other (See Comments)    Cause blurred vision and red eyes, pt has glaucoma   Aspirin Other (See Comments)    Sever stomach pain due to ulcer scaring.   Bis Subcit-Metronid-Tetracyc Swelling    Tongue swelling. Face tingling Other reaction(s): Unknown   Cephalexin Hives    Other reaction(s): hives   Ciprofloxacin Diarrhea   Codeine Nausea And Vomiting   Cyclobenzaprine Other (See Comments)    Tingly/prickly sensation. Other reaction(s): tingly/prickly sensation   Darvocet [Propoxyphene N-Acetaminophen] Nausea And Vomiting   Demerol [Meperidine] Nausea Only   Dexlansoprazole Swelling    Redness, swelling and peeling of both feet. Other reaction(s): foot pain   Diphedryl [Diphenhydramine] Other (See Comments)    Increased pulse/small amount ok   Doxycycline Hyclate Other (See Comments)    GI intolerance.   Doxycycline Hyclate     Other reaction(s): GI intolerance   Epinephrine Other (See Comments)    Breathing problems Other reaction(s): breathing problems/fainting   Erythromycin Other (See Comments)    GI intolerance. Other reaction(s): GI   Fish Oil     Other reaction(s): breathing problems/hives   Hyoscyamine     Other reaction(s): eye pain   Latex Other (See Comments)    Gloves ok.  Skin gets red from elastic in underwear and latex bandaides.   Meperidine Hcl     Other reaction(s): vomiting   Nitrofurantoin Diarrhea   Other     Other reaction(s): migraines Other reaction(s): Unknown Other reaction(s): Unknown Other reaction(s): Unknown Other reaction(s): increased pulse, faint, diarrhea   Prednisone Other (See Comments)    Headache Other reaction(s): headache   Ra Diphedryl Allergy [Diphenhydramine Hcl]     Other reaction(s): increased pulse small dose okay   Sertraline Hcl Swelling and Other (See Comments)    Migraine Swelling of tongue/lip (09/2012) Other reaction(s): Unknown    Shellfish-Derived Products     Other reaction(s): Unknown   Sulfa Antibiotics Other (See Comments)    Increased pulse, fainting, diarrhea, thrush   Wellbutrin [Bupropion] Other (See Comments)    Headaches   Xylocaine [Lidocaine Hcl]     With epinephrine, given by dentist.  Speeded up heart rate and she passed out (occured twice, at dentist)   Xylocaine [Lidocaine]     Other reaction(s): Unknown   Biaxin [Clarithromycin] Rash    Started after completing 10 day course of 2000 mg /day, Lips swelling   Ibuprofen Other (See Comments)    Motrin ok with a GI effect. Other reaction(s): rash Motrin okay with a GI effect    ROS: No fever. Some chills and sweats off/on. Decreased appetite, abdominal pain, belching, tongue pain, itchy scalp per HPI.  No bleeding, bruising, chest pain.  +chronic back pain.  No URI or allergy symptoms, cough, shortness of breath. Some constipation, bowel movements every 2  days, sometimes strains. See HPI   PHYSICAL EXAM:  BP 120/70   Pulse 67   Temp 97.9 F (36.6 C)   Wt 93 lb 9.6 oz (42.5 kg)   LMP  (LMP Unknown)   SpO2 98%   BMI 18.90 kg/m   Wt Readings from Last 3 Encounters:  05/18/22 93 lb 9.6 oz (42.5 kg)  05/09/22 90 lb 8 oz (41.1 kg)  05/01/22 93 lb 3.2 oz (42.3 kg)   Thin, elderly, frail female in no distress. Some frequent throat-clearing during visit. HEENT: Skin on scalp is normal, some thinning.  Conjunctiva and sclera are clear, EOMI. OP is clear--tongue appears normal, mucus membranes are moist, no lesions. Nasal mucosa is normal, no drainage, no sinus tenderness. Neck: no lymphadenopathy, thyromegaly or mass Heart: regular rate and rhythm Lungs: clear bilaterally Abdomen: tender in epigastrium and LUQ. Nontender elsewhere. No rebound, guarding or masses. Extremities: no edema Skin: no rashes. Limited exam, long sleeves/pants.   ASSESSMENT/PLAN:  Gastroesophageal reflux disease without esophagitis - Belching, raspy throat.   Reviewed precautions. To restart omeprazole '20mg'$  daily, before a meal. - Plan: omeprazole (PRILOSEC) 20 MG capsule  Mouth pain - reassured of normal exam. Can try coconut oil to painful areas of mouth prn  Itchy scalp - scalp appears normal (other than some thinning). Discussed shampoos/conditioner; f/u with derm if not improving  Decreased appetite - related to GI issues and depression. Unwilling to try Megace or retry the Remeron.  Protein-calorie malnutrition, unspecified severity ([place]) - She is maintaining weight despite decreased appetite. Reviewed diet, suggestions made  Constipation, unspecified constipation type - increase miralax to 1/2 capful every day, discussed titration as needed. Cont water intake. Increase fiber intake  I spent 36 minutes dedicated to the care of this patient, including pre-visit review of records, face to face time, post-visit ordering of testing and documentation.

## 2022-05-18 ENCOUNTER — Ambulatory Visit (INDEPENDENT_AMBULATORY_CARE_PROVIDER_SITE_OTHER): Payer: Medicare Other | Admitting: Psychiatry

## 2022-05-18 ENCOUNTER — Encounter: Payer: Self-pay | Admitting: Family Medicine

## 2022-05-18 ENCOUNTER — Ambulatory Visit (INDEPENDENT_AMBULATORY_CARE_PROVIDER_SITE_OTHER): Payer: Medicare Other | Admitting: Family Medicine

## 2022-05-18 VITALS — BP 120/70 | HR 67 | Temp 97.9°F | Wt 93.6 lb

## 2022-05-18 DIAGNOSIS — R630 Anorexia: Secondary | ICD-10-CM

## 2022-05-18 DIAGNOSIS — K219 Gastro-esophageal reflux disease without esophagitis: Secondary | ICD-10-CM

## 2022-05-18 DIAGNOSIS — K5900 Constipation, unspecified: Secondary | ICD-10-CM | POA: Diagnosis not present

## 2022-05-18 DIAGNOSIS — E46 Unspecified protein-calorie malnutrition: Secondary | ICD-10-CM | POA: Diagnosis not present

## 2022-05-18 DIAGNOSIS — L299 Pruritus, unspecified: Secondary | ICD-10-CM | POA: Diagnosis not present

## 2022-05-18 DIAGNOSIS — K1379 Other lesions of oral mucosa: Secondary | ICD-10-CM

## 2022-05-18 DIAGNOSIS — F411 Generalized anxiety disorder: Secondary | ICD-10-CM

## 2022-05-18 MED ORDER — OMEPRAZOLE 20 MG PO CPDR: 20.0000 mg | DELAYED_RELEASE_CAPSULE | Freq: Every day | ORAL | 0 refills | Status: DC

## 2022-05-18 NOTE — Patient Instructions (Addendum)
Follow up with the dermatologist if scalp itching doesn't improve. Your scalp appears normal, without any dandruff or inflammation. You can try medicated shampoos for itching (Selsun, Tea tree), leave them on for at least 5 minutes before washing off, and use them 2x/week. You can also try really massaging in a conditioner and leaving that on your scalp for longer than normal, to see if this helps moisturize the scalp If not better, talk with your dermatologist.  I recommend taking your Miralax daily, rather than every other day, since you are still having some abdominal discomfort, bloating, and not having having regular bowel movements. You stay at 1/2 capful, but not  having regular bowel movement, you can further increase the dose to a full cap.  I recommend restart the omeprazole '20mg'$  once daily. This may help with some of your throat symptoms and burping. If your throat symptoms don't improve with the omeprazole, start taking claritin daily (thinking that there may be some postnasal drainage contributing.  You can try rubbing a small amount of coconut oil to any sore spots in your mouth.

## 2022-05-18 NOTE — Progress Notes (Addendum)
[facility] Counselor/Therapist Progress Note  Patient ID: Tracy Malone, MRN: [id number],    Date: 05/18/2022  Time Spent: 50 minutes   Virtual Visit via Telehealth Note:  Telephone only session, pt not able to do the video. Connected with patient by a telemedicine/telehealth application, with their informed consent, and verified patient privacy and that I am speaking with the correct person using two identifiers. I discussed the limitations, risks, security and privacy concerns of performing psychotherapy and the availability of in person appointments. I also discussed with the patient that there may be a patient responsible charge related to this service. The patient expressed understanding and agreed to proceed. I discussed the treatment planning with the patient. The patient was provided an opportunity to ask questions and all were answered. The patient agreed with the plan and demonstrated an understanding of the instructions. The patient was advised to call  our office if  symptoms worsen or feel they are in a crisis state and need immediate contact.   Therapist Location: office Patient Location: home  Treatment Type: Individual Therapy  Reported Symptoms: anxiety "main symptom"  Mental Status Exam:  Appearance:   N/a   telehealth      Behavior:  N/a    telehealth  Motor:  Uses cane to walk  Speech/Language:   Clear and Coherent  Affect:  N/a     telehealth  Mood:  anxious and depressed  Thought process:  tangential  Thought content:    Rumination  Sensory/Perceptual disturbances:    WNL  Orientation:  oriented to person, place, time/date, situation, day of week, month of year, year, and stated date of Oct. 26, 2023  Attention:  Fair  Concentration:  Fair  Memory:  Tracy Malone of knowledge:   Good  Insight:    Fair  Judgment:   Good  Impulse Control:  Good   Risk Assessment: Danger to Self:  No Self-injurious Behavior: No Danger to Others: No Duty to  Warn:no Physical Aggression / Violence:No  Access to Firearms a concern: No  Gang Involvement:No   Subjective:  Patient reporting anxiety and depression. "I just don't know and I don't care" and continues to talk about her frustrations, but difficult to follow through with suggestions from medical personnel as to some changes that could benefit her.  Does plan to contact her doctors office later when she "feels more like it with any questions that she has.  Also stated she probably just needed a nap and might feel better afterwards.  Distressing for her to see the ways in which she is declining some and also her husband as he has shown some decline also.  They are in their upper 74s and still live independently but have talked recently how they realize at some point they may not be able to remain living on their own and may have to consider other options such as assisted living or retirement homes of some type.  Patient denies any thoughts of harming herself.  She was quite tearful throughout the first half of session and actually seemed better after having vented tears and frustrations as well as some fears but also some hopes for feeling better again.  Husband, although he has his physical issues, is very supportive of her.  Together they have sometimes worked on a list of the "positives" and husband had encouraged her to Post-it at home in a place where she sees often.  Patient spoke about this in her session  today stating how fortunate she is that they have live so long and have been able to stay together and are still pulling for each other.  Encouraged patient to get outside some as she is able and also to try focusing more on the positives versus negatives, remaining on her medications as prescribed.  Reviewed goal-directed behaviors and encouraged patient to try what she is able to in order to help herself but not to get down on herself when she cannot do everything that she would like to  do.  Interventions: Cognitive Behavioral Therapy and Ego-Supportive  Long term goal: Develop the ability to recognize, accept, and cope with feelings of anxiety and depression. Short term goal: Verbalize any unresolved grief issues that may be contributing to anxiety and depression. Strategy: Replace negative self-defeating self talk with verbalization of realistic and positive cognitive messages to lessen depression/anxiety and improve mood.  Diagnosis:   ICD-10-CM   1. Generalized anxiety disorder  F41.1      Plan:  Patient today participated well in session however was lacking some motivation that seem to be due to her ongoing health issues involving as she reports her back, some gastroesophageal issues, and her Parkinson's.  Shared that she really had not been able to focus too much on her goal recently of getting out more due to her physical problems which is totally understandable.  Seem to need session today to really talk about her discouragement at times, her anxiety and some depression but she felt today that anxiety was her stronger symptom and the one that we focused on more.  Discussed some strategies she has tried previously that she was not able to follow through on but did acknowledge that if she can get out a little more that might help and she seemed to have interest in getting outside some she is able but more interested in being able to go to their church.  They have felt warmly welcomed and supported there but have not been able to attend in some time due to her and her husband having health issues.  I suggested that maybe she could contact someone in the church office to see if there might be a church member who could come by on Sunday morning and pick them up for church and then give him a ride home afterwards, and patient seem very interested in that she would call the office.  Husband also having some health issues and is not driving much anymore.  Both patient and husband  are in their upper 37s and are currently living independently but are also realizing that eventually that may need to change in order to get help that they may eventually need.  Transitions are not easy for patient particularly.  Some tearfulness today but not as prolonged as on some occasions  Discussed some of goal-directed behaviors and what feels more realistic to her at this point.  Towards the end of our session she did sound more upbeat, more energetic, a willingness to follow through on strategies discussed today and especially in reaching out to her church which is proven to be very helpful to her previously.  Encourage patient to reflect occasionally on the list that we have worked on in a previous session that related to her positives as it has helped to keep her "centered" during times of stress.  Encouraged her in practicing positive behaviors as noted in session including: Positive self talk and self-care, participate in activities as she is able especially in  things she has previously enjoyed occluding phone calls with friends, getting outside of the house occasionally, reading, and reaching out to her church that has been very supportive of her previously, reading, allow for good sleep patterns, listening to music which she finds emotionally calming, staying in contact with family members and friends who are supportive, and recognize the strength she shows when working with goal-directed behaviors to move in a direction that supports her improved emotional health.   Goal review and progress/challenges noted with patient.  Next appointment within 2 to 3 weeks.  This record has been created using [employer].  Chart creation errors have been sought, but may not always have been located and corrected.  Such creation errors do not reflect on the standard of medical care provided.   Shanon Ace, LCSW

## 2022-05-24 ENCOUNTER — Ambulatory Visit (INDEPENDENT_AMBULATORY_CARE_PROVIDER_SITE_OTHER): Payer: Medicare Other | Admitting: Psychiatry

## 2022-05-24 ENCOUNTER — Telehealth: Payer: Self-pay | Admitting: Adult Health

## 2022-05-24 ENCOUNTER — Telehealth: Payer: Self-pay | Admitting: Family Medicine

## 2022-05-24 DIAGNOSIS — F331 Major depressive disorder, recurrent, moderate: Secondary | ICD-10-CM | POA: Diagnosis not present

## 2022-05-24 NOTE — Telephone Encounter (Signed)
Please advise,no upcoming appt

## 2022-05-24 NOTE — Telephone Encounter (Signed)
We need an appointment to discuss adding medications.

## 2022-05-24 NOTE — Telephone Encounter (Signed)
Advise him that this is best directed to her psychiatrist.  Insomnia and sleep is often treated by the psychiatrist (since it is a symptom of anxiety and depression).  Anything that might help her sleep, may also make her groggy during the day (in which case she won't like it or take it). Belsomra is the safest medication to take for sleep (in the elderly, without making someone too groggy).  Not sure if she has ever taken this or discussed it with her psychiatrist, but that would be my suggestion.

## 2022-05-24 NOTE — Progress Notes (Signed)
[facility] Counselor/Therapist Progress Note  Patient ID: Tracy Malone, MRN: [id number],    Date: 05/24/2022  Time Spent: 50 minutes   Virtual Visit via Telehealth Note: Telephone only as patient not able to handle the video Connected with patient by a telemedicine/telehealth application, with their informed consent, and verified patient privacy and that I am speaking with the correct person using two identifiers. I discussed the limitations, risks, security and privacy concerns of performing psychotherapy and the availability of in person appointments. I also discussed with the patient that there may be a patient responsible charge related to this service. The patient expressed understanding and agreed to proceed. I discussed the treatment planning with the patient. The patient was provided an opportunity to ask questions and all were answered. The patient agreed with the plan and demonstrated an understanding of the instructions. The patient was advised to call  our office if  symptoms worsen or feel they are in a crisis state and need immediate contact.   Therapist Location: office Patient Location: home  Treatment Type: Individual Therapy  Reported Symptoms: depression, anxiety, tired "and I'm supposed to call my PCP Tracy Malone today about a possible med to help me sleep better  Mental Status Exam:  Appearance:   N/a    Telehealth  Behavior:  Sharing and motivation challenged  Motor:  Uses rollater or cane in walking  Speech/Language:   Clear and Coherent  Affect:  N/a   telehealth  Mood:  anxious and depressed  Thought process:  Some tangentiality  Thought content:    Rumination  Sensory/Perceptual disturbances:    WNL  Orientation:  oriented to person, place, time/date, situation, day of week, month of year, year, and stated date of Nov. 1, 2023  Attention:  Good  Concentration:  Good and Fair  Memory:  Tracy Malone of knowledge:   Good  Insight:    Fair  Judgment:    Good and Fair  Impulse Control:  Good   Risk Assessment: Danger to Self:  No Self-injurious Behavior: No Danger to Others: No Duty to Warn:no Physical Aggression / Violence:No  Access to Firearms a concern: No  Gang Involvement:No   Subjective: Patient participating in session today and reporting continued anxiety, depression, and feeling tired "as my sleep has not been very good."  Very hard to focus on any positives even though "positives and negatives" are brought up as she interacts in session. Son who is now free to visit her, is planning to visit in about 3-3 1/2 weeks. Reports she has not eating as well, and we discussed some healthier eating habits for her with which she agrees to go. Does have a lady coming out to assist at home daily, lady is through [employer], and patient has been very accepting of her being in their home. Did discuss some concerns re: son who is to visit later this month. Worries also about her husband with his health concerns. Reflecting on the years she and husband have been together 61 yrs "as of 2 days ago" ; patient is 11 yrs old and husband is 57 yrs old, "the challenges and the good times."  Reflecting seem to be helpful to patient but difficult for her right now to stay more in the present or to stay focused on any positives even for short periods of time.  States she had already planned to call her PCP today about some sleep medication that might help her get more  regular sleep.  Overall, not as tearful today.  Still working on her list of "positives" that she posts at home per her report.  Did encourage patient to get outside as she is able even for very small amounts of time.  Encouraged her to continue trying to do what she is able to do, and let that be a good thing for her right now rather than having unrealistic expectations of what she feels "she should be able to do."  Interventions: Cognitive Behavioral Therapy, Solution-Oriented/Positive Psychology,  and Ego-Supportive  Long term goal: Develop the ability to recognize, accept, and cope with feelings of anxiety and depression. Short term goal: Verbalize any unresolved grief issues that may be contributing to anxiety and depression. Strategy: Replace negative self-defeating self talk with verbalization of realistic and positive cognitive messages to lessen depression/anxiety and improve mood.   Diagnosis:   ICD-10-CM   1. Major depressive disorder, recurrent episode, moderate (HCC)  F33.1      Plan: Patient today participating in session and states that she is still having depression, anxiety, and not sleeping very well but is "to call her PCP today about some medication to help her sleep".  A significant positive for patient is that she is accepting of the lady from [employer] that comes out each day during the week to help out around the house for patient and her husband who is also having some physical challenges.  Patient still struggles with dwelling on the negatives and the past, although can work in therapy on naming some positives and talking about them but very difficult to not go back to the negatives.  Is hoping that she will be able to get better and more consistent sleep to where that might positively impact her mood and behaviors as well.  Seem to really appreciate being able to talk through situations today that trouble her and also trying to recognize the positives for she and her husband.  Encouraged him also to be in touch with her church who has been very supportive in the past.  Did sound a little more upbeat by the end of our phone session today and not quite as overwhelmed.  Patient states she still has a list that we worked on in a previous session that does tend to help center her at times during increased stress/anxiety, and encouraged her to use that list in ways that are meaningful to her and help reduce her symptoms.  Encouraged patient in practicing positive  behaviors as noted in session including participating in activities as she is able especially in things she used to enjoy including phone calls with friends, getting outside of the house occasionally, reading, reaching out to her church, and positive self talk and self-care.  Goal review and progress/challenges noted with patient.  Next appointment within 2 weeks.  This record has been created using [employer].  Chart creation errors have been sought, but may not always have been located and corrected.  Such creation errors do not reflect on the standard of medical care provided.   Shanon Ace, LCSW

## 2022-05-24 NOTE — Telephone Encounter (Signed)
Tracy Malone called wanted to know what recommendations Dr. Tomi Bamberger might have to help Tracy Malone sleep. Pt was offered an appt but he just wanted to ask Dr. Tomi Bamberger first. He can be reached at [phone].

## 2022-05-24 NOTE — Telephone Encounter (Signed)
Pt called reporting she can not sleep and see if RM could give her Rx for Belsomra. She spoke to PCP and he advised would have to talk with RM. [facility] due to delivery

## 2022-05-24 NOTE — Telephone Encounter (Signed)
Could we pls follow up with patient.

## 2022-05-24 NOTE — Telephone Encounter (Signed)
Apt 11/2 at 1:20. Pt asking what she can do until apt? She has got to get some sleep. Advise Pt @ [phone]

## 2022-05-24 NOTE — Telephone Encounter (Signed)
Pt called and advised

## 2022-05-24 NOTE — Telephone Encounter (Signed)
Please call pt to schedule appt

## 2022-05-25 NOTE — Telephone Encounter (Signed)
We cn discuss at appointment tomorrow.

## 2022-05-25 NOTE — Telephone Encounter (Signed)
Rtc to pt and she reports last night she slept all night, first time in months maybe years she says. She didn't do anything different. Maybe just exhausted. She reports she thinks the Alprazolam isn't working as well as it use to. She also reports that she has these horrible muscle spasms mostly in her legs at night causing them to be restless so she is unable to sleep. She does have apt tomorrow 11/03 but didn't know if Barnett Applebaum could suggest or recommend something before then. Informed her I would update Barnett Applebaum and follow up if there was anything prior to her apt tomorrow.

## 2022-05-26 ENCOUNTER — Encounter: Payer: Self-pay | Admitting: Adult Health

## 2022-05-26 ENCOUNTER — Ambulatory Visit (INDEPENDENT_AMBULATORY_CARE_PROVIDER_SITE_OTHER): Payer: Medicare Other | Admitting: Adult Health

## 2022-05-26 DIAGNOSIS — F410 Panic disorder [episodic paroxysmal anxiety] without agoraphobia: Secondary | ICD-10-CM

## 2022-05-26 DIAGNOSIS — G4700 Insomnia, unspecified: Secondary | ICD-10-CM | POA: Diagnosis not present

## 2022-05-26 DIAGNOSIS — F411 Generalized anxiety disorder: Secondary | ICD-10-CM | POA: Diagnosis not present

## 2022-05-26 DIAGNOSIS — F331 Major depressive disorder, recurrent, moderate: Secondary | ICD-10-CM | POA: Diagnosis not present

## 2022-05-26 MED ORDER — MIRTAZAPINE 7.5 MG PO TABS: 7.5000 mg | ORAL_TABLET | Freq: Every day | ORAL | 0 refills | Status: DC

## 2022-05-26 NOTE — Progress Notes (Signed)
SERI Malone [id number] [birth year] 88 y.o.  Virtual Visit via Telephone Note  I connected with pt on 05/26/22 at  1:20 PM EDT by telephone and verified that I am speaking with the correct person using two identifiers.   I discussed the limitations, risks, security and privacy concerns of performing an evaluation and management service by telephone and the availability of in person appointments. I also discussed with the patient that there may be a patient responsible charge related to this service. The patient expressed understanding and agreed to proceed.   I discussed the assessment and treatment plan with the patient. The patient was provided an opportunity to ask questions and all were answered. The patient agreed with the plan and demonstrated an understanding of the instructions.   The patient was advised to call back or seek an in-person evaluation if the symptoms worsen or if the condition fails to improve as anticipated.  I provided 25 minutes of non-face-to-face time during this encounter.  The patient was located at home.  The provider was located at [facility].   Tracy Gibbs, NP   Subjective:   Patient ID:  Tracy Malone is a 88 y.o. (DOB 05/24/1932) female.  Chief Complaint: No chief complaint on file.   HPI Tracy Malone presents for follow-up of MDD, GAD, panic attacks and insomnia.  Husband also on phone call.  Describes mood today as "not good at all". Pleasant. Tearful throughout interview. Mood symptoms - reports depression and anxiety. Reports irritability at times. Reports worry and rumination. Denies recent panic attacks. Does not feel like the Prozac is working well to manage symptoms. Willing to consider other options. Decreased interest and motivation. Taking medications as prescribed. Seeing therapist - Tracy Malone. Energy levels lower. Using a rolling walker. Active, does not have a regular exercise routine.  Enjoys some usual interests  and activities. Married. Lives with husband - 2 grown children. Spending time with family. Appetite decreased. Having difficulties eating. Weight loss. Sleeping difficulties - "I can't sleep". Focus and concentration difficulties. Completing tasks. Managing aspects of household. Retired.  Denies SI or HI.  Denies AH or VH. Denies self harm. Denies substance use.  Previous medication trials: Multiple medication trials    Review of Systems:  Review of Systems  Musculoskeletal:  Negative for gait problem.  Neurological:  Negative for tremors.  Psychiatric/Behavioral:         Please refer to HPI    Medications: I have reviewed the patient's current medications.  Current Outpatient Medications  Medication Sig Dispense Refill   ALPRAZolam (XANAX) 0.5 MG tablet Take four tablets daily as needed for anxiety. 120 tablet 2   Carbidopa-Levodopa ER (SINEMET CR) 25-100 MG tablet controlled release TAKE ONE TABLET THREE TIMES DAILY 270 tablet 3   dicyclomine (BENTYL) 10 MG capsule TAKE 1 CAPSULE BY MOUTH EVERY 6 HOURS AS NEEDED 30 capsule 6   docusate sodium (COLACE) 100 MG capsule Take 1 capsule (100 mg total) by mouth 2 (two) times daily as needed for mild constipation. 60 capsule 0   FLUoxetine (PROZAC) 10 MG capsule Take 1 or 2 capsules every day by oral route. 60 capsule 11   omeprazole (PRILOSEC) 20 MG capsule Take 1 capsule (20 mg total) by mouth daily. Take before a meal 1 capsule 0   polyethylene glycol (MIRALAX) 17 g packet Take 17 g by mouth daily as needed. 14 each 0   SYNTHROID 25 MCG tablet Take 1 tablet Monday through Saturday, skipping  Sundays. 90 tablet 0   No current facility-administered medications for this visit.    Medication Side Effects: None  Allergies:  Allergies  Allergen Reactions   Iodinated Contrast Media Anaphylaxis   Iodine Anaphylaxis    IV and topical forms. Other reaction(s): Unknown   Levsin [Hyoscyamine Sulfate]     Vision problems/pt has glaucoma    Salmon [Fish Allergy] Hives and Shortness Of Breath   Shellfish Allergy Anaphylaxis   Tramadol Swelling   Remeron [Mirtazapine] Other (See Comments)    Cause blurred vision and red eyes, pt has glaucoma   Aspirin Other (See Comments)    Sever stomach pain due to ulcer scaring.   Bis Subcit-Metronid-Tetracyc Swelling    Tongue swelling. Face tingling Other reaction(s): Unknown   Cephalexin Hives    Other reaction(s): hives   Ciprofloxacin Diarrhea   Codeine Nausea And Vomiting   Cyclobenzaprine Other (See Comments)    Tingly/prickly sensation. Other reaction(s): tingly/prickly sensation   Darvocet [Propoxyphene N-Acetaminophen] Nausea And Vomiting   Demerol [Meperidine] Nausea Only   Dexlansoprazole Swelling    Redness, swelling and peeling of both feet. Other reaction(s): foot pain   Diphedryl [Diphenhydramine] Other (See Comments)    Increased pulse/small amount ok   Doxycycline Hyclate Other (See Comments)    GI intolerance.   Doxycycline Hyclate     Other reaction(s): GI intolerance   Epinephrine Other (See Comments)    Breathing problems Other reaction(s): breathing problems/fainting   Erythromycin Other (See Comments)    GI intolerance. Other reaction(s): GI   Fish Oil     Other reaction(s): breathing problems/hives   Hyoscyamine     Other reaction(s): eye pain   Latex Other (See Comments)    Gloves ok.  Skin gets red from elastic in underwear and latex bandaides.   Meperidine Hcl     Other reaction(s): vomiting   Nitrofurantoin Diarrhea   Other     Other reaction(s): migraines Other reaction(s): Unknown Other reaction(s): Unknown Other reaction(s): Unknown Other reaction(s): increased pulse, faint, diarrhea   Prednisone Other (See Comments)    Headache Other reaction(s): headache   Ra Diphedryl Allergy [Diphenhydramine Hcl]     Other reaction(s): increased pulse small dose okay   Sertraline Hcl Swelling and Other (See Comments)    Migraine Swelling of  tongue/lip (09/2012) Other reaction(s): Unknown   Shellfish-Derived Products     Other reaction(s): Unknown   Sulfa Antibiotics Other (See Comments)    Increased pulse, fainting, diarrhea, thrush   Wellbutrin [Bupropion] Other (See Comments)    Headaches   Xylocaine [Lidocaine Hcl]     With epinephrine, given by dentist.  Speeded up heart rate and she passed out (occured twice, at dentist)   Xylocaine [Lidocaine]     Other reaction(s): Unknown   Biaxin [Clarithromycin] Rash    Started after completing 10 day course of 2000 mg /day, Lips swelling   Ibuprofen Other (See Comments)    Motrin ok with a GI effect. Other reaction(s): rash Motrin okay with a GI effect    Past Medical History:  Diagnosis Date   Bell's palsy 1966   Hx: right side facial droop, resolved per patient 04/02/19   Carotid artery disease (HCC) 2010   on vascular screening;unchanged 2013.(could not tolerate simvastatin, no other statins tried)--<30% blockage bilat 07/2011   Chronic abdominal pain    Chronic fatigue and malaise    Claustrophobia    Cyst of left ovary    last imaging 06/2021, benign, no further  f/u   Depression    treated in the past for years;stopped in 2010 for a years   Duodenal ulcer 1962   h/o   Dysrhythmia    ocassional PVC's, no current problems per patient on 04/02/19   Fibromyalgia    Frequent PVCs 07/2012   Seen by [facility]: benign, asymptomatic, normal EF   Gallstones 02/2021   on [place]   GERD (gastroesophageal reflux disease)    diet controlled   Glaucoma, narrow-angle    s/p laser surgery   History of hiatal hernia    during endoscopy   Hypothyroid 03/2007   IBS (irritable bowel syndrome)    Dr. Elnoria Howard   Ischemic colitis (HCC) 11/21/2018   no current problems per patient on 04/02/19   Lichenoid keratosis 12/03/2020   Dr.Stinehelfer   Ocular migraine    Osteoporosis 04/2010   Dr.Hawkes; later consulted Dr. Sharl Ma (2022)   Panic attack    Parkinson disease    Parkinson's  disease 06/23/2016   Recurrent UTI    has cystocele-Dr.Grewal   Shingles 1999   h/o   Superficial thrombophlebitis 03/2009   RLE   Trochanteric bursitis 12/2008   bilateral    Family History  Problem Relation Age of Onset   Heart disease Mother    Hypertension Mother    Hypertension Sister    HIV Son    Heart disease Brother    Lung cancer Brother        lung   Diabetes Maternal Grandfather    Diabetes Granddaughter        type 1   Colon cancer Neg Hx    Esophageal cancer Neg Hx    Prostate cancer Neg Hx    Stomach cancer Neg Hx    Rectal cancer Neg Hx     Social History   Socioeconomic History   Marital status: Married    Spouse name: Chrissie Noa   Number of children: 2   Years of education: Not on file   Highest education level: Not on file  Occupational History   Occupation: retired (school system)  Tobacco Use   Smoking status: Never   Smokeless tobacco: Never  Vaping Use   Vaping Use: Never used  Substance and Sexual Activity   Alcohol use: No    Alcohol/week: 0.0 standard drinks of alcohol   Drug use: No   Sexual activity: Not Currently    Partners: Male    Birth control/protection: Other-see comments    Comment: Hysterectomy  Other Topics Concern   Not on file  Social History Narrative   Married.  Son lives in [place]; Daughter [name] lives in [place]; 2 grandchildren   Social Determinants of Health   Financial Resource Strain: Low Risk  (05/05/2021)   Overall Financial Resource Strain (CARDIA)    Difficulty of Paying Living Expenses: Not hard at all  Food Insecurity: Not on file  Transportation Needs: No Transportation Needs (05/05/2021)   PRAPARE - Administrator, Civil Service (Medical): No    Lack of Transportation (Non-Medical): No  Physical Activity: Not on file  Stress: Not on file  Social Connections: Not on file  Intimate Partner Violence: Not on file    Past Medical History, Surgical history, Social history, and Family  history were reviewed and updated as appropriate.   Please see review of systems for further details on the patient's review from today.   Objective:   Physical Exam:  LMP  (LMP Unknown)   Physical Exam  Constitutional:      General: She is not in acute distress. Musculoskeletal:        General: No deformity.  Neurological:     Mental Status: She is alert and oriented to person, place, and time.     Cranial Nerves: No dysarthria.     Coordination: Coordination normal.  Psychiatric:        Attention and Perception: Attention and perception normal. She does not perceive auditory or visual hallucinations.        Mood and Affect: Mood normal. Mood is not anxious or depressed. Affect is not labile, blunt, angry or inappropriate.        Speech: Speech normal.        Behavior: Behavior normal. Behavior is cooperative.        Thought Content: Thought content normal. Thought content is not paranoid or delusional. Thought content does not include homicidal or suicidal ideation. Thought content does not include homicidal or suicidal plan.        Cognition and Memory: Cognition and memory normal.        Judgment: Judgment normal.     Comments: Insight intact     Lab Review:     Component Value Date/Time   NA 138 04/20/2022 1022   NA 142 11/02/2021 1117   K 3.9 04/20/2022 1022   CL 100 04/20/2022 1022   CO2 29 04/20/2022 1022   GLUCOSE 91 04/20/2022 1022   BUN 16 04/20/2022 1022   BUN 12 11/02/2021 1117   CREATININE 0.68 04/20/2022 1022   CREATININE 0.75 10/22/2018 1401   CREATININE 0.67 11/23/2016 0815   CALCIUM 9.8 04/20/2022 1022   PROT 7.3 04/20/2022 1022   PROT 6.4 11/02/2021 1117   ALBUMIN 4.3 04/20/2022 1022   ALBUMIN 4.4 11/02/2021 1117   AST 19 04/20/2022 1022   AST 17 10/22/2018 1401   ALT 6 04/20/2022 1022   ALT 7 10/22/2018 1401   ALKPHOS 57 04/20/2022 1022   BILITOT 1.3 (H) 04/20/2022 1022   BILITOT 1.3 (H) 11/02/2021 1117   BILITOT 0.8 10/22/2018 1401    GFRNONAA >60 04/20/2022 1022   GFRNONAA >60 10/22/2018 1401   GFRAA >60 03/16/2020 1153   GFRAA >60 10/22/2018 1401       Component Value Date/Time   WBC 7.7 04/20/2022 1022   RBC 5.06 04/20/2022 1022   HGB 15.3 (H) 04/20/2022 1022   HGB 13.4 03/10/2021 1547   HCT 47.3 (H) 04/20/2022 1022   HCT 39.5 03/10/2021 1547   PLT 264 04/20/2022 1022   PLT 266 03/10/2021 1547   MCV 93.5 04/20/2022 1022   MCV 89 03/10/2021 1547   MCH 30.2 04/20/2022 1022   MCHC 32.3 04/20/2022 1022   RDW 13.3 04/20/2022 1022   RDW 12.2 03/10/2021 1547   LYMPHSABS 2.8 04/20/2022 1022   LYMPHSABS 1.4 03/10/2021 1547   MONOABS 0.6 04/20/2022 1022   EOSABS 0.3 04/20/2022 1022   EOSABS 0.1 03/10/2021 1547   BASOSABS 0.1 04/20/2022 1022   BASOSABS 0.0 03/10/2021 1547    No results found for: "POCLITH", "LITHIUM"   No results found for: "PHENYTOIN", "PHENOBARB", "VALPROATE", "CBMZ"   .res Assessment: Plan:    Plan:  PDMP reviewed  1. Xanax 0.5mg  - 2 x daily prn anxiety - usually undertakes 2. D/C Prozac 10mg  daily - stomach issues 3. Add Remeron 7.5mg  at hs - has medication  Home health - M-F coming to help out - 4 hours a day.   RTC 3 months  Patient advised to contact office with any questions, adverse effects, or acute worsening in signs and symptoms.  Discussed potential benefits, risk, and side effects of benzodiazepines to include potential risk of tolerance and dependence, as well as possible drowsiness. Advised patient not to drive if experiencing drowsiness and to take lowest possible effective dose to minimize risk of dependence and tolerance.   There are no diagnoses linked to this encounter.  Please see After Visit Summary for patient specific instructions.  Future Appointments  Date Time Provider Department Center  05/26/2022  1:20 PM Hager Compston, Thereasa Solo, NP [facility] None  06/07/2022  1:00 PM Mathis Fare, LCSW [facility] None  11/08/2022  2:45 PM Sater, Pearletha Furl, MD [facility] None     No orders of the defined types were placed in this encounter.     -------------------------------

## 2022-05-29 ENCOUNTER — Telehealth: Payer: Self-pay | Admitting: Adult Health

## 2022-05-29 NOTE — Telephone Encounter (Signed)
Patient called regarding her medication. States she took 1/2 a pill and about 20 minutes later she was dizzy and her vision was blurry as well as feeling nauseous.She also reports feeling jittery and nervous. She was told to call in and inform RM about what happened with medication. Dottie also says that taking a whole pill gave her a bad headache. Please rtc [phone]

## 2022-05-29 NOTE — Telephone Encounter (Signed)
Please review for gina according to the note she may be referring to mirtazapine

## 2022-05-29 NOTE — Telephone Encounter (Signed)
Rtc to pt and she reports taking 1/2 tablet of Remeron 7.5 mg and it really hit her. She reports blurry vision, couldn't hardly stand or walk, dizzy, lightheaded, nauseous. The next day she still felt out of it. She reports when she took a whole tablet it only caused a headache but it did last 3 days. She is not taking anything now, advised her not to take anymore Remeron until discussed with [facility]. She agreed and reports she will go back to Prozac even with stomach pain or try Zoloft again. Whatever she recommends.  Informed her Rollene Fare would be back in the AM and I would discuss with her then. She was appreciative of call.

## 2022-05-29 NOTE — Telephone Encounter (Signed)
Noted will contact pt

## 2022-05-30 NOTE — Telephone Encounter (Signed)
Noted. Ty!

## 2022-05-31 ENCOUNTER — Telehealth: Payer: Self-pay | Admitting: Adult Health

## 2022-05-31 NOTE — Telephone Encounter (Signed)
LVM to RC 

## 2022-05-31 NOTE — Telephone Encounter (Signed)
Tracy Malone called to see if she should go back on Prozac or not? See previous messages from 11/6. Her number is [phone].

## 2022-06-01 NOTE — Telephone Encounter (Signed)
She can try the Prozac. Is she taking it with food?

## 2022-06-01 NOTE — Telephone Encounter (Signed)
Let patient know that Tracy Malone said ok to restart Prozac. She said she has decided that the stomach issues are from something else and not caused by the Prozac. She said she had plenty of Prozac available and did not need a fill at this time.

## 2022-06-01 NOTE — Telephone Encounter (Signed)
Patient said she takes Prozac with dinner. She said she thinks the stomach issues are from something besides the medication.

## 2022-06-01 NOTE — Telephone Encounter (Signed)
Patient is asking to restart Prozac. She has been off and on this several times due to stomach pain, but nothing else seems to work for her. She mentions retrying Zoloft. This was discontinued by PCP years ago due to SE. The mirtazapine didn't work for her. Any other recommendations in place of Prozac?

## 2022-06-01 NOTE — Telephone Encounter (Signed)
Pt Lvm @ 12:21.  She can't take the Remeron.  She wants to know if she can go back to the Prozac.  No upcoming appt scheduled.Marland Kitchen

## 2022-06-07 ENCOUNTER — Ambulatory Visit (INDEPENDENT_AMBULATORY_CARE_PROVIDER_SITE_OTHER): Payer: Medicare Other | Admitting: Psychiatry

## 2022-06-07 ENCOUNTER — Telehealth: Payer: Self-pay | Admitting: Pharmacist

## 2022-06-07 ENCOUNTER — Ambulatory Visit: Payer: Medicare Other | Admitting: Neurology

## 2022-06-07 ENCOUNTER — Encounter: Payer: Self-pay | Admitting: Psychiatry

## 2022-06-07 DIAGNOSIS — H04123 Dry eye syndrome of bilateral lacrimal glands: Secondary | ICD-10-CM | POA: Diagnosis not present

## 2022-06-07 DIAGNOSIS — H0100A Unspecified blepharitis right eye, upper and lower eyelids: Secondary | ICD-10-CM | POA: Diagnosis not present

## 2022-06-07 DIAGNOSIS — F411 Generalized anxiety disorder: Secondary | ICD-10-CM | POA: Diagnosis not present

## 2022-06-07 DIAGNOSIS — H0100B Unspecified blepharitis left eye, upper and lower eyelids: Secondary | ICD-10-CM | POA: Diagnosis not present

## 2022-06-07 NOTE — Progress Notes (Signed)
[facility] Counselor/Therapist Progress Note  Patient ID: Tracy Malone, MRN: [id number],    Date: 06/07/2022  Time Spent: 50 minutes   Virtual Visit via Telehealth Note: Telephone only, patient not able to do the Video due to physical reasons Connected with patient by a telemedicine/telehealth application, with their informed consent, and verified patient privacy and that I am speaking with the correct person using two identifiers. I discussed the limitations, risks, security and privacy concerns of performing psychotherapy and the availability of in person appointments. I also discussed with the patient that there may be a patient responsible charge related to this service. The patient expressed understanding and agreed to proceed. I discussed the treatment planning with the patient. The patient was provided an opportunity to ask questions and all were answered. The patient agreed with the plan and demonstrated an understanding of the instructions. The patient was advised to call  our office if  symptoms worsen or feel they are in a crisis state and need immediate contact.   Therapist Location: office Patient Location: home   Treatment Type: Individual Therapy  Reported Symptoms: anxiety, depression  Mental Status Exam:  Appearance:   N/a   telehealth      Behavior:  Appropriate and Sharing  Motor:  Uses rollater to walk  Speech/Language:   Clear and Coherent  Affect:  N/a  teleheath  Mood:  anxious and depressed  Thought process:  goal directed  Thought content:    Rumination  Sensory/Perceptual disturbances:    WNL  Orientation:  oriented to person, place, time/date, situation, day of week, month of year, year, and stated date of Nov. 15, 2023  Attention:  Good  Concentration:  Good and Fair  Memory:  WNL  Fund of knowledge:   Good  Insight:    Good and Fair  Judgment:   Good  Impulse Control:  Good   Risk Assessment: Danger to Self:  No Self-injurious Behavior:  No Danger to Others: No Duty to Warn:no Physical Aggression / Violence:No  Access to Firearms a concern: No  Gang Involvement:No   Subjective: Patient today tearful and admits she is assuming worst case scenarios.  Was extremely tearful initially today and worked with her on talking through her fears and concerns presently and into the future.  Was hard for her not to be totally consumed with fears of the future.  Threatening to cancel her doctor's appointment today after talking about how badly she needed it and that she did not want to have to wait in the lobby.  Try to work with her and coach her through using a different option and taking a book or her phone and some air pods with her to be able to listen to music which she does enjoy a lot and tends to also calm her down.  Patient agreed and plans to keep her appointment with the doctor this afternoon in reference to some type of "irritation" she is having with one of her eyes.  She is in a difficult situation with her and her husband's health issues but does not want to leave their home at this point, but may be considering at in the future.  Still has a person coming from [place] 5 days a week to help out at home.  Cried a lot initially but then was more calm and rational and able to talk things through including fevers, her tendency to focus on negatives and worst-case scenarios.  We discussed how  those tend to keep her more depressed and more on edge and how in previous times when she was able to let go and stay more in the present not assuming things 1 where the other, she did better and felt better.  She agrees to try that more once we get off the phone today and was calmer by the end of session.  Denies any thoughts to harm self or others.  Is rescheduling again the next.  Urged her to use what she refers to as her "list of positives" that has been helpful to her before and feeling more grounded and calm.  Another positive for her and her  husband is that the lady from [place] is a known person to them and they feel comfortable with her.  Interventions: Cognitive Behavioral Therapy, Solution-Oriented/Positive Psychology, and Ego-Supportive  Long term goal: Develop the ability to recognize, accept, and cope with feelings of anxiety and depression. Short term goal: Verbalize any unresolved grief issues that may be contributing to anxiety and depression. Strategy: Replace negative self-defeating self talk with verbalization of realistic and positive cognitive messages to lessen depression/anxiety and improve mood.  Is a non-smoker, never smoked, per patient report.  Diagnosis:   ICD-10-CM   1. Generalized anxiety disorder  F41.1      Plan: Patient today did well and working through a lot of tearfulness, sadness, fears, and her assuming of worst-case scenarios for she and her husband.  They are both experiencing health difficulties and are ages 70 and 49.  Patient did hint today that they might be considering some type of retirement or medical community placement in the future but not ready just yet.  Very difficult for patient to stay in the present and not worry about lots of things into the future but did seem to make that effort today and I do know it is typically hard for her to hold onto but she knows she can call our office anytime and she is also looking for another appointment 1 to 2 weeks out with the knowledge she can call if needed before that time.  Upon ending session she is calm and focused on going to her eye doctor this afternoon. Encouraged patient in the strength that she shows in getting through tough times and how she has done better in the past when she did not focus on worst-case scenarios.  Did well in talking through this some today but very hard for her to maintain.  Is still using the list that we worked on in a previous session that she says helps her to "get centered" at times of high anxiety and  stress and uses a list as a reminder to look for the positives and not focus only on the negatives.  Encouraged her and practicing more positive behaviors especially in participating in activities that she enjoys and that she says helps her feel better including phone calls with friends, reading, music, spending time with her husband talking, getting out of the house occasionally, and reading.  Encouraged continued positive self-care and self talk.  Goal review and progress/challenges noted with patient.  Next appt within 3 weeks.  This record has been created using [employer].  Chart creation errors have been sought, but may not always have been located and corrected.  Such creation errors do not reflect on the standard of medical care provided.   Shanon Ace, LCSW

## 2022-06-07 NOTE — Progress Notes (Signed)
Chronic Care Management Pharmacy Assistant   Name: Tracy Malone  MRN: [id number] DOB: 01/28/1932  Reason for Encounter: Disease State/ General Assessment   Recent office visits:  05/18/22 [name], MD - Patient presented for GERD and other concerns. Prescribed Omeprazole. Stopped Megestrol Acetate.   05/01/22 [name], MD  - Patient presented for Constipation unspecified type and other concerns. Stopped Ketotifen Stopped Mirtazapine.   03/30/22 [name], MD - Patient presented for Mouth sore and other concerns. Stopped Fluoxetine.   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 No medication changes.   03/09/22 Pollyann Glen, CMA - Patient [facility] for Chronic left sided low back pain with left sided sciatica and other concerns. Toradol & Depo Medrol  administered. No medication changes.    Recent consult visits:  05/26/22 Mozingo, Berdie Ogren (Behavioral health) - Patient presented for Major depressive disorder. No other visit details available.   05/24/22 Shanon Ace LCSW [facility]) - Patient presented for Major depressive disorder. No other visit details available.   05/09/22 Sater, Nanine Means, MD (Neurology) - Patient presented for Parkinson's disease without dyskinesia unspecified whether manifestations fluctuate and other concerns. Changed Sinemet. Changed Fluoxetine.   05/05/22 Marijo Conception, FNP (Palliative Care) - Home visit for LUQ pain and other concerns. No medication changes.   04/28/22 Cirigliano, Dominic Pea, DO [facility]) - Patient presented for Rib pain on left side and other concerns. No medication changes.   04/07/22 Marijo Conception, FNP (Palliative Care) - Home visit for Allergic conjunctivitis of left eye and other concerns. -Zaditor given for eye.   03/28/22 Mozingo, Berdie Ogren (Behavioral health) - Patient presented for Insomnia and other  concerns. No other visit details available.   03/20/22 Danie Binder, PT - Patient presented for Other low back pain and other concerns. No medication changes.   03/08/22 Marijo Conception, FNP (Palliative Care) - Home visit for depression with anxiety and other concerns. No medication changes.   03/07/22 Glennon Mac, DO (Sports Med) - Patient presented for Chronic left sided low back pain with left sided sciatica and other concerns.No medication changes.   01/30/22 Glennon Mac, DO (Sports Med) - Patient presented for Rib pain on left side and other concerns. No medication changes.   01/26/22 Marijo Conception, FNP (Palliative Care) - Home visit for Chronic left sided low back pain with left sided sciatica and other concerns. No medication changes.   01/25/22  Mozingo, Berdie Ogren (Behavioral health) - Patient presented for Insomnia and other concerns. No other visit details available.   01/04/22 Conan Bowens, RN - Patient presented via phone for lower back and leg pain. No medication changes.   01/03/22 Irish Lack - Patient presented via video for Major depressive disorder. No other visit details available.   12/21/21 Glennon Mac, DO (Sports Med) - Patient presented for Chronic left sided low back pain with left sided sciatica and other concerns.   12/15/21 Mozingo, Berdie Ogren (Behavioral health) - Patient presented for Insomnia and other concerns. No other visit details available.   12/14/21 Sater, Nanine Means, MD (Neurology) - Patient presented for Parkinson's disease and other concerns. Stopped Acetaminophen Stopped Fluoxetine.   Hospital visits:  Medication Reconciliation was completed by comparing discharge summary, patient's EMR and Pharmacy list, and upon discussion with patient.  Patient presented to [facility] ED on 04/20/22 due to Abdominal Pain unspecified  abdominal location and other concerns. Patient was present for 5  hours.  New?Medications Started at [facility] Discharge:?? -started  docusate sodium (COLACE) 100 MG capsule  polyethylene glycol (MIRALAX) 17 g packet   Medication Changes at Hospital Discharge: -Changed  none  Medications Discontinued at Hospital Discharge: -Stopped  None  Medications that remain the same after Hospital Discharge:??  -All other medications will remain the same.    Medications: Outpatient Encounter Medications as of 06/07/2022  Medication Sig Note   ALPRAZolam (XANAX) 0.5 MG tablet Take four tablets daily as needed for anxiety.    Carbidopa-Levodopa ER (SINEMET CR) 25-100 MG tablet controlled release TAKE ONE TABLET THREE TIMES DAILY    dicyclomine (BENTYL) 10 MG capsule TAKE 1 CAPSULE BY MOUTH EVERY 6 HOURS AS NEEDED    docusate sodium (COLACE) 100 MG capsule Take 1 capsule (100 mg total) by mouth 2 (two) times daily as needed for mild constipation. 05/01/2022: Only taking once daily   mirtazapine (REMERON) 7.5 MG tablet Take 1 tablet (7.5 mg total) by mouth at bedtime.    omeprazole (PRILOSEC) 20 MG capsule Take 1 capsule (20 mg total) by mouth daily. Take before a meal    polyethylene glycol (MIRALAX) 17 g packet Take 17 g by mouth daily as needed. 05/01/2022: Using daily   SYNTHROID 25 MCG tablet Take 1 tablet Monday through Saturday, skipping Sundays.    No facility-administered encounter medications on file as of 06/07/2022.   [place] for General Review Call   Adherence Review:  Does the Clinical Pharmacist Assistant have access to adherence rates? Yes Adherence rates for STAR metric medications  No star rated medications   Disease State Questions:  Able to connect with Patient? Yes Did patient have any problems with their health recently? No  Have you had any admissions or emergency room visits or worsening of your condition(s) since last visit? Yes  Have you had any visits with new specialists or providers since your last visit?  No  Have you had any new health care problem(s) since your last visit? No  Have you run out of any of your medications since you last spoke with clinical pharmacist? No  Are there any medications you are not taking as prescribed? No  Are you having any issues or side effects with your medications? No : Do you have any other health concerns or questions you want to discuss with your Clinical Pharmacist before your next visit? No  Are there any health concerns that you feel we can do a better job addressing? No Patient reports she is doing fair, she does not have any concerns or needs at present and she will be sure to reach out if that changes.  Are you having any problems with any of the following since the last visit: (select all that apply)  None  12. Any falls since last visit? No  13. Any increased or uncontrolled pain since last visit? No  Care Gaps: Zoster Vaccine - Overdue TDAP - Overdue COVID Booster - Overdue CCM- pt Declined at this time   Star Rating Drugs: None     Ned Clines [place] Clinical Pharmacist Assistant [phone]

## 2022-06-19 ENCOUNTER — Telehealth: Payer: Self-pay | Admitting: Neurology

## 2022-06-19 ENCOUNTER — Telehealth: Payer: Self-pay | Admitting: Adult Health

## 2022-06-19 NOTE — Telephone Encounter (Signed)
Pt states she is unable to sleep at night.  She is asking for something mild to be called in for her, she can not take Ambien, please call.

## 2022-06-19 NOTE — Telephone Encounter (Signed)
Pt lvm that she is not sleeping and she is tired and upset. She needs something mild that will go with her other medications. Please call her at [phone]

## 2022-06-19 NOTE — Telephone Encounter (Signed)
Patient c/o not being able to sleep. Mirtazapine had been prescribed previously, but she said that gives her a headache and she can't take it. Alprazolam helps her get to sleep, but doesn't help with staying asleep. She said she has been up since 2 AM. She is anxious/depressed/tired and is asking for something "light" to help with sleep. She doesn't want Ambien - said it caused hallucinations. Medication needs to be compatible with Sinemet. Pharmacy is [facility].  I asked her if she was still taking Prozac and she said no, that you told her not to. I talked with her on 11/8 and she wanted to restart it, then she said she guessed she was still taking it, that she was so tired she didn't know what she was doing.

## 2022-06-19 NOTE — Telephone Encounter (Signed)
Called pt back. Recommended she speak with psychiatrist about sleep issues/medication options. She had visit with her on 05/26/22 and sleep issues mentioned at this visit. She will call her.  She will let us know what her recommendation is to ensure [name] ok with her taking with other medications he prescribes.

## 2022-06-19 NOTE — Telephone Encounter (Signed)
We can try Hydroxyzine '10mg'$  at hs for sleep.

## 2022-06-20 MED ORDER — HYDROXYZINE HCL 10 MG PO TABS: 10.0000 mg | ORAL_TABLET | Freq: Every day | ORAL | 0 refills | Status: DC

## 2022-06-20 NOTE — Telephone Encounter (Signed)
Rx sent after speaking with patient.

## 2022-06-21 ENCOUNTER — Ambulatory Visit (INDEPENDENT_AMBULATORY_CARE_PROVIDER_SITE_OTHER): Payer: Medicare Other | Admitting: Psychiatry

## 2022-06-21 DIAGNOSIS — F411 Generalized anxiety disorder: Secondary | ICD-10-CM

## 2022-06-21 NOTE — Progress Notes (Signed)
[facility] Counselor/Therapist Progress Note  Patient ID: Tracy Malone, MRN: [id number],    Date: 06/21/2022  Time Spent: 48 minutes   Virtual Visit via Telehealth Note: Telephone only, patient unable to do the video Connected with patient by a telemedicine/telehealth application, with their informed consent, and verified patient privacy and that I am speaking with the correct person using two identifiers. I discussed the limitations, risks, security and privacy concerns of performing psychotherapy and the availability of in person appointments. I also discussed with the patient that there may be a patient responsible charge related to this service. The patient expressed understanding and agreed to proceed. I discussed the treatment planning with the patient. The patient was provided an opportunity to ask questions and all were answered. The patient agreed with the plan and demonstrated an understanding of the instructions. The patient was advised to call  our office if  symptoms worsen or feel they are in a crisis state and need immediate contact.   Therapist Location: office Patient Location: home   Treatment Type: Individual Therapy  Reported Symptoms: anxiety, depression, overwhelmed, when I get upset I get agitated and "hard to let go of negatives", worried about husband (age 86); states "I just can't let go of things"  Mental Status Exam:  Appearance:   N/a   telehealth      Behavior:  Sharing  Motor:  Uses rollater to walk  Speech/Language:   Clear and Coherent  Affect:  N/a   telehealth  Mood:  anxious and sad/ depressed  Thought process:  goal directed  Thought content:    Rumination  Sensory/Perceptual disturbances:    WNL  Orientation:  oriented to person, place, time/date, situation, day of week, month of year, year, and stated date of Nov. 29, 2023  Attention:  Good  Concentration:  Fair  Memory:  "Not really having memory issues"  Fund of knowledge:   Good   Insight:    Good and Fair  Judgment:   Good  Impulse Control:  Good   Risk Assessment: Danger to Self:  No Self-injurious Behavior: No Danger to Others: No Duty to Warn:no Physical Aggression / Violence:No  Access to Firearms a concern: No  Gang Involvement:No   Subjective: Patient today reporting anxiety "my main symptom", depression, and tearfulness. Denies any SI. Tearful most of session until nearer the end and was more leveled out emotionally at that point. Needed to vent a lot of her negative thoughts and worked on her being able to let go of them and be able to see more positives. Tends to find it difficult to refrain from focusing on worst case scenarios. Vented a lot of negative and challenge her to name some of her positives and she was able to do that.  Just hard to truly let go of hurtful thoughts and feelings. To reach out to her church and inquire about a visit and that was very helpful to patient and husband several months ago. Is liking the lady from [place] that comes to their home each day M-F. Seemed more upbeat by session end and talking about her son is to come visit sometime later in December.  Denied any thoughts of harming self or others.  Urged patient again to make use of her "positives list" that we worked together on previously and another session and she reports that that list has been helpful to her and being more calm and grounded and feeling comforted.  Interventions: Cognitive  Behavioral Therapy, Solution-Oriented/Positive Psychology, and Ego-Supportive  Long term goal: Develop the ability to recognize, accept, and cope with feelings of anxiety and depression. Short term goal: Verbalize any unresolved grief issues that may be contributing to anxiety and depression. Strategy: Replace negative self-defeating self talk with verbalization of realistic and positive cognitive messages to lessen depression/anxiety and improve mood.  Diagnosis:   ICD-10-CM    1. Generalized anxiety disorder  F41.1      Plan:   (Patient reports no new medical issues since last session).  Patient today was quite tearful initially but did get better over time.  Denies any SI.  States that sometimes she does not know why she is crying but usually is about her health status and her not having seen her son for quite a while.  He is out now and staying in the [place] area from what I have been told.  He is supposedly coming to see her before Christmas but she is not sure when.  States that if she sees him that will help her a lot.  Is able to engage in therapy and reports having tried some strategies however I think it is hard for her to keep it up in between sessions.  I encouraged her to walk some at a nearby gym close to where she lives and she also felt that would help her.  The lady from [place] that comes during the week stated that she would go with her and walk also.  Hoping they will follow through on this as I really think it would help patient to get out more as she is able and especially do some walking to keep up her energy and some muscle tone, as well as being around more people.  Patient and her husband are both quite elderly, ages 40 and 33 and both have significant health issues. Encourage patient to follow through and suggestions made today especially trying to focus more on the positives, having more contact with other people, try getting out some to a nearby gym to walk along with the lady that comes from [place], listening to her music which she enjoys a lot, phone calls with friends, reading, spending time with her husband.  Reminded her to follow through with the contact to her church as they have visited patient and her husband before and that was helpful.  Goal review and progress/challenges noted with patient.  Next appointment within 2 to 3 weeks.  This record has been created using [employer].  Chart creation errors have been sought,  but may not always have been located and corrected.  Such creation errors do not reflect on the standard of medical care provided.   Shanon Ace, LCSW

## 2022-06-29 ENCOUNTER — Ambulatory Visit (INDEPENDENT_AMBULATORY_CARE_PROVIDER_SITE_OTHER): Payer: Medicare Other | Admitting: Psychiatry

## 2022-06-29 DIAGNOSIS — F331 Major depressive disorder, recurrent, moderate: Secondary | ICD-10-CM | POA: Diagnosis not present
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
## 2022-06-29 NOTE — Addendum Note (Signed)
Addended byShanon Ace on: 06/29/2022 12:26 PM   Modules accepted: Level of Service

## 2022-06-30 ENCOUNTER — Ambulatory Visit (INDEPENDENT_AMBULATORY_CARE_PROVIDER_SITE_OTHER): Payer: Medicare Other | Admitting: Nurse Practitioner

## 2022-06-30 ENCOUNTER — Encounter: Payer: Self-pay | Admitting: Nurse Practitioner

## 2022-06-30 VITALS — BP 118/72 | HR 73 | Ht 59.0 in | Wt 91.4 lb

## 2022-06-30 DIAGNOSIS — N3001 Acute cystitis with hematuria: Secondary | ICD-10-CM

## 2022-06-30 DIAGNOSIS — M545 Low back pain, unspecified: Secondary | ICD-10-CM

## 2022-06-30 LAB — POCT URINALYSIS DIP (CLINITEK)
Bilirubin, UA: NEGATIVE
Glucose, UA: NEGATIVE mg/dL
Ketones, POC UA: NEGATIVE mg/dL
Nitrite, UA: POSITIVE — AB
POC PROTEIN,UA: NEGATIVE
Spec Grav, UA: <=1.005 — AB (ref 1.010–1.025)
Urobilinogen, UA: 0.2 E.U./dL
pH, UA: 6 (ref 5.0–8.0)

## 2022-06-30 MED ORDER — CIPROFLOXACIN HCL 250 MG PO TABS: 250.0000 mg | ORAL_TABLET | Freq: Two times a day (BID) | ORAL | 0 refills | Status: AC

## 2022-06-30 MED ORDER — CEFTRIAXONE SODIUM 1 G IJ SOLR
1.0000 g | Freq: Once | INTRAMUSCULAR | Status: AC
  Administered 2022-06-30: 1 g via INTRAMUSCULAR

## 2022-06-30 NOTE — Progress Notes (Signed)
Tracy Render, DNP, AGNP-c [place], [place] [address] [phone]  Subjective:   Tracy Malone is a 88 y.o. female presents to day for evaluation of: Pelvic Pain Ammy reports suprapubic pain and pressure, as well as pressure int he lower back for the past several days. She tells me the pain is constant and nothing seems to make it better. She had a cyst on her left ovary that is being monitored, she is not sure if this is the cyst or possible UTI. She has been drinking plenty of water and not holding her urine. She does not believe she has had fevers, but she has had chills. No vaginal or bowel symptoms present.  Patient ok to take rocephin  PMH, Medications, and Allergies reviewed and updated in chart as appropriate.   ROS negative except for what is listed in HPI. Objective:  BP 118/72 (BP Location: Right Arm, Patient Position: Sitting)   Pulse 73   Ht '4\' 11"'$  (1.499 m)   Wt 91 lb 6.4 oz (41.5 kg)   LMP  (LMP Unknown)   SpO2 99%   BMI 18.46 kg/m  Physical Exam Vitals and nursing note reviewed.  Constitutional:      Appearance: She is ill-appearing.  HENT:     Head: Normocephalic.  Eyes:     Pupils: Pupils are equal, round, and reactive to light.  Cardiovascular:     Rate and Rhythm: Normal rate and regular rhythm.     Pulses: Normal pulses.     Heart sounds: Normal heart sounds.  Pulmonary:     Effort: Pulmonary effort is normal.     Breath sounds: Normal breath sounds.  Abdominal:     General: Abdomen is flat. Bowel sounds are normal. There is no distension.     Palpations: Abdomen is soft.     Tenderness: There is abdominal tenderness in the suprapubic area. There is right CVA tenderness. There is no left CVA tenderness or guarding.  Skin:    General: Skin is warm and dry.     Capillary Refill: Capillary refill takes less than 2 seconds.  Neurological:     General: No focal deficit present.     Mental Status: She is  alert.  Psychiatric:        Mood and Affect: Mood normal.           Assessment & Plan:   Problem List Items Addressed This Visit     Acute cystitis with hematuria - Primary    Suprapubic pain with low back and right sided costovertebral angle tenderness present. UA shows signs of hematuria and leukocytes.  Given the current symptoms and presentation we will go ahead and begin therapy today with antibiotics.  She does have multiple antibiotic allergies therefore selection is quite limited.  We did discuss the option of cephalosporins and she tells me she has taken the safely in the past.  Will provide Rocephin injection in the office today given the severity of her symptoms and an additional 3 days of oral treatment to be started tomorrow.  If there is no improvement of symptoms or symptoms worsen she will plan to follow-up.      Other Visit Diagnoses     Low back pain, unspecified back pain laterality, unspecified chronicity, unspecified whether sciatica present       Relevant Orders   POCT URINALYSIS DIP (CLINITEK) (Completed)       Time: 31 minutes, >50% spent counseling, care coordination,  chart review, and documentation.    Tracy Render, DNP, AGNP-c 07/19/2022  2:33 PM    History, Medications, Surgery, SDOH, and Family History reviewed and updated as appropriate.

## 2022-06-30 NOTE — Patient Instructions (Signed)
I have sent in a medication for you called Ciprofloxacin that you will need to take twice a day for 3 days starting tomorrow. You need to take this with food to make sure that it doesn't upset your stomach. If you get diarrhea ,you can take imodium to help calm it down. I also recommend eating yogurt.

## 2022-07-03 ENCOUNTER — Ambulatory Visit: Payer: Medicare Other | Admitting: Family Medicine

## 2022-07-03 ENCOUNTER — Telehealth: Payer: Self-pay | Admitting: Family Medicine

## 2022-07-03 NOTE — Telephone Encounter (Signed)
I believe you saw this patient on 06/30/22. Please advise, message below. Thank you.

## 2022-07-03 NOTE — Telephone Encounter (Signed)
Pt  called on Cipro for UTI.   She is having bad cramps and diarrhea. She wants to know what she can do for the cramping. She is taking meds with food, today is last day of Cipro

## 2022-07-04 ENCOUNTER — Ambulatory Visit: Payer: Medicare Other | Admitting: Neurology

## 2022-07-04 NOTE — Telephone Encounter (Signed)
Patient advised of Tracy Malone's recommendations.

## 2022-07-05 NOTE — Progress Notes (Unsigned)
No chief complaint on file.  Patient presents with complaint of diarrhea. She saw SaraBeth Early on 12/8 with low back pain and UTI.  Urine showed 2+ leuks, +nitrite. Culture wasn't sent. She was put on cipro x 3d. She was advised to take it with food, and to use imodium if she develops diarrhea.  Eating yogurt was also recommended.

## 2022-07-06 ENCOUNTER — Encounter: Payer: Self-pay | Admitting: Family Medicine

## 2022-07-06 ENCOUNTER — Ambulatory Visit (INDEPENDENT_AMBULATORY_CARE_PROVIDER_SITE_OTHER): Payer: Medicare Other | Admitting: Family Medicine

## 2022-07-06 VITALS — BP 124/70 | HR 80 | Temp 97.1°F | Ht 59.0 in | Wt 92.4 lb

## 2022-07-06 DIAGNOSIS — Z87440 Personal history of urinary (tract) infections: Secondary | ICD-10-CM | POA: Diagnosis not present

## 2022-07-06 DIAGNOSIS — B3731 Acute candidiasis of vulva and vagina: Secondary | ICD-10-CM | POA: Diagnosis not present

## 2022-07-06 DIAGNOSIS — R197 Diarrhea, unspecified: Secondary | ICD-10-CM

## 2022-07-06 DIAGNOSIS — F332 Major depressive disorder, recurrent severe without psychotic features: Secondary | ICD-10-CM | POA: Diagnosis not present

## 2022-07-06 LAB — POCT URINALYSIS DIP (PROADVANTAGE DEVICE)
Bilirubin, UA: NEGATIVE
Blood, UA: NEGATIVE
Glucose, UA: NEGATIVE mg/dL
Ketones, POC UA: NEGATIVE mg/dL
Leukocytes, UA: NEGATIVE
Nitrite, UA: NEGATIVE
Protein Ur, POC: NEGATIVE mg/dL
Specific Gravity, Urine: 1.005
Urobilinogen, Ur: 0.2
pH, UA: 6 (ref 5.0–8.0)

## 2022-07-06 NOTE — Patient Instructions (Signed)
Your urine was clear--no more infection! Your skin was very red and raw.  Since you mentioned the thick vaginal discharge, I suspect that you have a yeast infection from the antibiotics. You preferred to use a topical medication rather than a fluconazole pill.  Use an over-the-counter Monistat as directed on the package--use some of it externally on the red area of skin. You may also use a zinc oxide cream or Desitin as a barrier, to protect the red, inflamed skin from further irritation (from urine, stool, wiping).  Back off on taking the imodium, since we know that it easily leads you to more constipation, which makes your stomach feel even more swollen and worse.  I hope you feel back to your normal self soon. Remember to find something positive each day, focus on a happy memory, something that makes you smile. Follow up with your therapist as planned for next week.  Reach out if you need anything else.  Happy holidays to you and your family.

## 2022-07-07 ENCOUNTER — Encounter: Payer: Self-pay | Admitting: Family Medicine

## 2022-07-07 MED ORDER — FLUCONAZOLE 150 MG PO TABS: 150.0000 mg | ORAL_TABLET | Freq: Once | ORAL | 0 refills | Status: AC

## 2022-07-10 ENCOUNTER — Telehealth: Payer: Self-pay | Admitting: Family Medicine

## 2022-07-10 NOTE — Telephone Encounter (Signed)
Sent MyChart message reply

## 2022-07-10 NOTE — Telephone Encounter (Signed)
Pt called and wanted you to know she wet her pad this morning.  She is still using the monostat.  There was green on the pad.  Still having bladder and back pain.  No fever.  Please advise pt if anything further needs to be done.

## 2022-07-11 ENCOUNTER — Encounter: Payer: Self-pay | Admitting: Neurology

## 2022-07-11 ENCOUNTER — Telehealth: Payer: Self-pay | Admitting: Family Medicine

## 2022-07-11 NOTE — Progress Notes (Unsigned)
No chief complaint on file.   Patient presents with complaint of L abdominal pain and diarrhea.  She was treated for UTI with rocephin and 3d of cipro. She developed diarrhea, took some imodium. She also developed external vaginal irritation and some discharge. She used Monistat, which caused some stinging.  We sent in diflucan, but she continued with monistat.  ?ever took diflucan?  On the night of 12/18 she again had diarrhea, 8 episodes, with painful stomach cramps. She had bloating, nausea, sweating and cold during episodes.

## 2022-07-11 NOTE — Telephone Encounter (Signed)
Spoke with patient. I reminded her that when we spoke earlier she told me that she was not having any worsening pain and if she was Dr. Tomi Bamberger has advised her to be seen today in our office by one of the providers. She said she will try to make it unti tomorrow otherwise she will go to the ER. I offered to move her appt to an earlier time tomorrow but she said that would not work for her.

## 2022-07-11 NOTE — Telephone Encounter (Signed)
Pt requesting call from nurse Verdene Lennert) C/o  left side pain, she states it has been going on for a while but " nobody is listening"  She also complains of nausea  Tried to schedule appointment but she states she already has one and can not come in earlier  She states she is hurting and needs to know what she can do

## 2022-07-12 ENCOUNTER — Ambulatory Visit (INDEPENDENT_AMBULATORY_CARE_PROVIDER_SITE_OTHER): Payer: Medicare Other | Admitting: Family Medicine

## 2022-07-12 ENCOUNTER — Encounter: Payer: Self-pay | Admitting: Family Medicine

## 2022-07-12 VITALS — BP 130/70 | HR 72 | Temp 98.6°F | Ht 59.0 in | Wt 90.8 lb

## 2022-07-12 DIAGNOSIS — R109 Unspecified abdominal pain: Secondary | ICD-10-CM | POA: Diagnosis not present

## 2022-07-12 DIAGNOSIS — R197 Diarrhea, unspecified: Secondary | ICD-10-CM | POA: Diagnosis not present

## 2022-07-12 DIAGNOSIS — Z87440 Personal history of urinary (tract) infections: Secondary | ICD-10-CM | POA: Diagnosis not present

## 2022-07-12 DIAGNOSIS — B3731 Acute candidiasis of vulva and vagina: Secondary | ICD-10-CM | POA: Diagnosis not present

## 2022-07-12 LAB — POCT URINALYSIS DIP (PROADVANTAGE DEVICE)
Bilirubin, UA: NEGATIVE
Blood, UA: NEGATIVE
Glucose, UA: NEGATIVE mg/dL
Ketones, POC UA: NEGATIVE mg/dL
Nitrite, UA: NEGATIVE
Protein Ur, POC: NEGATIVE mg/dL
Specific Gravity, Urine: 1.015
Urobilinogen, Ur: 0.2
pH, UA: 6 (ref 5.0–8.0)

## 2022-07-12 NOTE — Patient Instructions (Signed)
Continue to stay well hydrated. Continue your probiotic pill. Avoid dairy. Stick with BRAT diet--bananas, rice, applesauce, toast. You can advance the diet to grilled chicken and other bland foods as tolerated. You can use the bentyl as needed for left sided abdominal pain/spasms.  Follow up right away if you develop fever, severe abdominal pain, blood in the stool or black stools.  Don't use any laxatives.  Try and eat a high fiber diet (restarting fiber once your stomach feels less bloated).  We are checking your labs for blood count and a chem panel.  Check your MyChart tomorrow for those results.  I hope you feel better soon!

## 2022-07-13 ENCOUNTER — Telehealth: Payer: Self-pay | Admitting: Neurology

## 2022-07-13 ENCOUNTER — Encounter: Payer: Self-pay | Admitting: Family Medicine

## 2022-07-13 ENCOUNTER — Ambulatory Visit (INDEPENDENT_AMBULATORY_CARE_PROVIDER_SITE_OTHER): Payer: Medicare Other | Admitting: Psychiatry

## 2022-07-13 ENCOUNTER — Ambulatory Visit: Payer: Medicare Other | Admitting: Family Medicine

## 2022-07-13 DIAGNOSIS — F411 Generalized anxiety disorder: Secondary | ICD-10-CM | POA: Diagnosis not present

## 2022-07-13 LAB — CBC WITH DIFFERENTIAL/PLATELET
Basophils Absolute: 0.1 10*3/uL (ref 0.0–0.2)
Basos: 1 %
EOS (ABSOLUTE): 0.3 10*3/uL (ref 0.0–0.4)
Eos: 4 %
Hematocrit: 42.5 % (ref 34.0–46.6)
Hemoglobin: 13.7 g/dL (ref 11.1–15.9)
Immature Grans (Abs): 0 10*3/uL (ref 0.0–0.1)
Immature Granulocytes: 0 %
Lymphocytes Absolute: 2 10*3/uL (ref 0.7–3.1)
Lymphs: 25 %
MCH: 29.7 pg (ref 26.6–33.0)
MCHC: 32.2 g/dL (ref 31.5–35.7)
MCV: 92 fL (ref 79–97)
Monocytes Absolute: 0.8 10*3/uL (ref 0.1–0.9)
Monocytes: 10 %
Neutrophils Absolute: 4.7 10*3/uL (ref 1.4–7.0)
Neutrophils: 60 %
Platelets: 285 10*3/uL (ref 150–450)
RBC: 4.62 x10E6/uL (ref 3.77–5.28)
RDW: 12 % (ref 11.7–15.4)
WBC: 7.9 10*3/uL (ref 3.4–10.8)

## 2022-07-13 LAB — COMPREHENSIVE METABOLIC PANEL
ALT: 6 IU/L (ref 0–32)
AST: 23 IU/L (ref 0–40)
Albumin/Globulin Ratio: 1.6 (ref 1.2–2.2)
Albumin: 4.3 g/dL (ref 3.7–4.7)
Alkaline Phosphatase: 70 IU/L (ref 44–121)
BUN/Creatinine Ratio: 16 (ref 12–28)
BUN: 12 mg/dL (ref 8–27)
Bilirubin Total: 1.2 mg/dL (ref 0.0–1.2)
CO2: 27 mmol/L (ref 20–29)
Calcium: 9.7 mg/dL (ref 8.7–10.3)
Chloride: 104 mmol/L (ref 96–106)
Creatinine, Ser: 0.74 mg/dL (ref 0.57–1.00)
Globulin, Total: 2.7 g/dL (ref 1.5–4.5)
Glucose: 98 mg/dL (ref 70–99)
Potassium: 4.3 mmol/L (ref 3.5–5.2)
Sodium: 144 mmol/L (ref 134–144)
Total Protein: 7 g/dL (ref 6.0–8.5)
eGFR: 78 mL/min/{1.73_m2} (ref >59)

## 2022-07-13 NOTE — Telephone Encounter (Signed)
Noted  

## 2022-07-13 NOTE — Telephone Encounter (Signed)
Pt called, Emma,RN said it would be ok for phone rep to relay the response from Dr Felecia Shelling.  Message relayed to pt, she will try what Dr Felecia Shelling suggested and call back next week if there are any issues.  This is FYI, no call back requested.
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
## 2022-07-13 NOTE — Telephone Encounter (Signed)
Pt states when she takes the Carbidopa-Levodopa ER (SINEMET CR) 25-100 MG tablet controlled release , her upper abdomen area feels swollen and she is unable to eat.  Pt would like a call to discuss.

## 2022-07-13 NOTE — Telephone Encounter (Signed)
Called pt back. She did not take Sinemet yesterday. Saw PCP yesterday for check up and everything ok. Diarrhea has resolved. No current infection. Restarted sinemet today. About 74mn after, bloating in abdomen occurred. No pain. This SE from med started about a month ago.  Has not started any other new meds recently. Wanting to know what MD recommends. She is not in favor of starting a new med.   Confirmed she typically takes med as follows:  Carbidopa-Levodopa ER (SINEMET CR) 25-100 MG tablet controlled release TAKE ONE TABLET THREE TIMES DAILY   Aware I will ask MD to review and call her back.

## 2022-07-19 ENCOUNTER — Other Ambulatory Visit: Payer: Self-pay | Admitting: *Deleted

## 2022-07-19 ENCOUNTER — Other Ambulatory Visit: Payer: Self-pay | Admitting: Neurology

## 2022-07-19 ENCOUNTER — Ambulatory Visit: Payer: Medicare Other | Admitting: Family Medicine

## 2022-07-19 MED ORDER — METHOCARBAMOL 500 MG PO TABS: ORAL_TABLET | ORAL | 2 refills | Status: DC

## 2022-07-19 NOTE — Assessment & Plan Note (Signed)
Suprapubic pain with low back and right sided costovertebral angle tenderness present. UA shows signs of hematuria and leukocytes.  Given the current symptoms and presentation we will go ahead and begin therapy today with antibiotics.  She does have multiple antibiotic allergies therefore selection is quite limited.  We did discuss the option of cephalosporins and she tells me she has taken the safely in the past.  Will provide Rocephin injection in the office today given the severity of her symptoms and an additional 3 days of oral treatment to be started tomorrow.  If there is no improvement of symptoms or symptoms worsen she will plan to follow-up.

## 2022-07-20 ENCOUNTER — Telehealth: Payer: Self-pay | Admitting: Sports Medicine

## 2022-07-20 NOTE — Telephone Encounter (Signed)
Patient called asking if there was somewhere that she could go to get a back brace that would be sturdy and help her walk better? She was not a fan of [employer] with past experiences.

## 2022-07-20 NOTE — Telephone Encounter (Signed)
Called patient and notified her of [employer] [facility] [employer]

## 2022-07-26 ENCOUNTER — Ambulatory Visit (INDEPENDENT_AMBULATORY_CARE_PROVIDER_SITE_OTHER): Payer: Medicare Other | Admitting: Psychiatry

## 2022-07-26 ENCOUNTER — Other Ambulatory Visit: Payer: Self-pay | Admitting: Adult Health

## 2022-07-26 DIAGNOSIS — F331 Major depressive disorder, recurrent, moderate: Secondary | ICD-10-CM | POA: Diagnosis not present

## 2022-07-26 DIAGNOSIS — F411 Generalized anxiety disorder: Secondary | ICD-10-CM

## 2022-07-26 NOTE — Progress Notes (Signed)
[facility] Counselor/Therapist Progress Note  Patient ID: ADAMARIS KING, MRN: [id number],    Date: 07/26/2022  Time Spent: 55 minutes              Treatment Type: Individual Therapy  Virtual Visit via Telehealth Note:  Telephone session as Patient unable to do the video Connected with patient by a telemedicine/telehealth application, with their informed consent, and verified patient privacy and that I am speaking with the correct person using two identifiers. I discussed the limitations, risks, security and privacy concerns of performing psychotherapy and the availability of in person appointments. I also discussed with the patient that there may be a patient responsible charge related to this service. The patient expressed understanding and agreed to proceed. I discussed the treatment planning with the patient. The patient was provided an opportunity to ask questions and all were answered. The patient agreed with the plan and demonstrated an understanding of the instructions. The patient was advised to call  our office if  symptoms worsen or feel they are in a crisis state and need immediate contact.   Therapist Location: office Patient Location: home   Reported Symptoms: depression, anxiety, Denies any SI  Mental Status Exam:  Appearance:   N/a   telehealth       Behavior:  Appropriate and Motivated  Motor:  N/a  telehealth  Speech/Language:   Clear and Coherent  Affect:  N/a   telehealth  Mood:  anxious and depressed  Thought process:  goal directed  Thought content:    Some obsessiveness  Sensory/Perceptual disturbances:    WNL  Orientation:  oriented to person, place, time/date, situation, day of week, month of year, year, and stated date of Jan. 3, 2024  Attention:  Good  Concentration:  Good and Fair  Memory:  [place] of knowledge:   Good  Insight:    Good and Fair  Judgment:   Good  Impulse Control:  Good   Risk Assessment: Danger to Self:  No Self-injurious  Behavior: No Danger to Others: No Duty to Warn:no Physical Aggression / Violence:No  Access to Firearms a concern: No  Gang Involvement:No   Subjective:  Patient reporting today in session symptoms of depression and anxiety, but no SI. Later shares she has felt more irritable at times and about "little piddly things". Think about things "I should have done but I didn't too." "Concerned about Bill and his health and don't want anything to happen to him."  "I think my Parkinson's getting worse as I have a little more problem in swallowing, and I noticed recently that I drooled some when I laid down." Focusing on all the negatives, unable to see positives although there are some. Feeling overwhelmed and needing to talk through her fears and concerns today which she did, and was able to feel calmer, and eventually focus on some positives, acknowledging she did feel stronger.  States that she has continued to not watch the TV program that she has been watching regularly, but was upsetting her a lot emotionally as it had a lot of traumatic content to it.  Encouraged patient to continue not watching the program as it works against her treatment to help her get better.  Interventions: Cognitive Behavioral Therapy, Solution-Oriented/Positive Psychology, and Ego-Supportive  Long term goal: Develop the ability to recognize, accept, and cope with feelings of anxiety and depression. Short term goal: Verbalize any unresolved grief issues that may be contributing to anxiety and depression.  Strategy: Replace negative self-defeating self talk with verbalization of realistic and positive cognitive messages to lessen depression/anxiety and improve mood.  Diagnosis:   ICD-10-CM   1. Major depressive disorder, recurrent episode, moderate (HCC)  F33.1      Plan: Patient today patient today initially very tearful and talk through a lot of frustrations negativity's, and worst-case scenarios but eventually was able to  round out her thoughts to be more positive even though she does definitely have some challenges in her life.  Does seem to recognize that when she is more positive she has more strength but admits this is hard for her to hold onto.  Is fearful about her Parkinson's and possibly getting worse as noted above.  Reports that she is following all doctors suggestions and staying on her medication as prescribed.  Also tries to be up and about some inside her house which helps at times.  Is concerned about her aging husband and his health as he has become some more fragile.  Is happy with a caretaker that comes in 5 days a week and helps in care of their home and also meal preparation.  Encouraged patient to stay in contact with supportive people including her church.  Feels that she is managing a very sensitive issue from her past much better now and has talked more about it some and a couple more sessions.  Feels that she "wants to focus on moving forward and being in the best health that she can be at this age" and in following doctors suggestions.  To remain on her medications as prescribed, and work with goal-directed behaviors that can help her eventually feel more stable and having more positive outlook, although still with some health challenges. Encouraged patient and practicing positive behaviors as discussed in session including: Follow-up on her doctors suggestions, remain on medications as prescribed, recognizing the strengths she has shown in getting through some tough times previously, refrain from assuming worst-case scenarios and all the negatives, refer to the list that we worked on in a previous session that patient has said helps her to "get centered" whenever she is anxious and stressed, use another list we worked on as a reminder for the positives and not to focus just on the negatives, participate in activities that she enjoys as she is able to do so physically and which she has admitted helps her  feel better when she does, continue phone calls with friends, music, positive self talk and self-care, reading, occasionally getting out of the house that she is able, and feel good about the strength she shows when she works with goal-directed behaviors to move in a direction that supports increased confidence, improved emotional health, and outlook.  Goal review and progress/challenges noted with patient.  Next appointment within 3 weeks.  This record has been created using [employer].  Chart creation errors have been sought, but may not always have been located and corrected.  Such creation errors do not reflect on the standard of medical care provided.   Shanon Ace, LCSW

## 2022-07-27 NOTE — Telephone Encounter (Signed)
Filled 12/2 appt 09/04/22

## 2022-08-08 NOTE — Progress Notes (Signed)
Chief Complaint  Patient presents with   Abdominal Pain    Has been having abdominal pain. Having constipation, then diarrhea, back to constipation. Woke up in the middle of night with pain in upper abdomen, also had pain left side of face. Then she had pain in her left arm that radiated into her left armpit and the pain felt hot. Husband reports that patient is not eating.    Woke up twice last night with a "heavy feeling" in her upper stomach, extending up to her lower chest. She had some associated belching.  She took Mylanta, and it helped.  She states she didn't take enough, only took half the recommended amount, and she took the rest when she woke up the second time with same symptoms.  Last night she also noted pain at her left side of her face--the neck, ear and jaw, and felt a hot sensation to her armpit. Currently denies any neck pain or any other L sided pain. Denies paresthesias or weakness in the LUE.  She has been having epigastric pain off and on for 2 weeks, with some nausea.  No vomiting. +belching.  Last bowel movement was 2 days ago.  Part was regular, part was loose.  A hemorrhoid was flaring, noticed some BRB on the toilet paper.  Felt like she emptied well. The BM prior to that was the day before, and it was diarrhea. Denies taking any laxative.  She tries to avoid imodium, hasn't taken any in the last 2 weeks. She uses miralax "off and on"--not often because it gets too runny.  She has been taking 1/2 capful every other day. She sometimes will go 3-4 days without a bowel movement.  About 2x/week sees hard stools (small balls). She takes Electronics engineer daily. Hasn't been taking fiber supplements, recalls feeling bloated (benefiber).  Her husband reports she isn't eating very much.  She admits to just "picking". She doesn't have much of an appetite, and is afraid to eat, worried she will have stomach pain.  Her stomach doesn't bloat or hurt when she doesn't eat.  She reports she  hasn't taken any of her pills today.  On chart review it was noted that she had developed bloating 45 minutes after taking Sinimet (she had called her neurologist).  She was advised to decrease to 1/2 tablet TID before meals, but she states tat the pharmacist told her not to cut the tablets--that they weren't scored, meds might not be evenly distributed.  She has been taking the full tablet TID before meals, and reports she still gets bloated. She hasn't felt bloated today, but hasn't eaten anything or taken her carbidopa/levadopa (or any other meds).  She was last seen here 12/20 with L abdominal pain and diarrhea. She got diarrhea and yeast infection after being treated for a UTI with rocephin and 3d of cipro earlier that month. She frequently complains of bloating and swelling on the left side, worse with eating.    PMH, PSH, SH reviewed  Outpatient Encounter Medications as of 08/09/2022  Medication Sig Note   ALPRAZolam (XANAX) 0.5 MG tablet TAKE FOUR TABLETS BY MOUTH DAILY AS NEEDED FOR ANXIETY 08/09/2022: Took one at 11am   Carbidopa-Levodopa ER (SINEMET CR) 25-100 MG tablet controlled release TAKE ONE TABLET THREE TIMES DAILY (Patient not taking: Reported on 08/09/2022) 08/09/2022: Has not taken today   dicyclomine (BENTYL) 10 MG capsule TAKE 1 CAPSULE BY MOUTH EVERY 6 HOURS AS NEEDED (Patient not taking: Reported on 07/06/2022) 07/06/2022: prn  docusate sodium (COLACE) 100 MG capsule Take 1 capsule (100 mg total) by mouth 2 (two) times daily as needed for mild constipation. (Patient not taking: Reported on 07/06/2022) 07/06/2022: prn   FLUoxetine (PROZAC) 10 MG tablet Take 10 mg by mouth daily. (Patient not taking: Reported on 07/12/2022)    hydrOXYzine (ATARAX) 10 MG tablet Take 1 tablet (10 mg total) by mouth at bedtime. (Patient not taking: Reported on 06/30/2022) 07/06/2022: prn   loperamide (IMODIUM) 2 MG capsule Take 2 mg by mouth as needed for diarrhea or loose stools. (Patient not  taking: Reported on 07/12/2022) 07/12/2022: prn   methocarbamol (ROBAXIN) 500 MG tablet One po qHS prn (Patient not taking: Reported on 08/09/2022)    omeprazole (PRILOSEC) 20 MG capsule Take 1 capsule (20 mg total) by mouth daily. Take before a meal (Patient not taking: Reported on 06/30/2022) 07/06/2022: prn   polyethylene glycol (MIRALAX) 17 g packet Take 17 g by mouth daily as needed. (Patient not taking: Reported on 07/06/2022) 07/06/2022: prn   SYNTHROID 25 MCG tablet Take 1 tablet Monday through Saturday, skipping Sundays. (Patient not taking: Reported on 08/09/2022) 08/09/2022: Did not take today.   No facility-administered encounter medications on file as of 08/09/2022.   Allergies  Allergen Reactions   Iodinated Contrast Media Anaphylaxis   Iodine Anaphylaxis    IV and topical forms. Other reaction(s): Unknown   Levsin [Hyoscyamine Sulfate]     Vision problems/pt has glaucoma   Salmon [Fish Allergy] Hives and Shortness Of Breath   Shellfish Allergy Anaphylaxis   Tramadol Swelling   Remeron [Mirtazapine] Other (See Comments)    Cause blurred vision and red eyes, pt has glaucoma   Aspirin Other (See Comments)    Sever stomach pain due to ulcer scaring.   Bis Subcit-Metronid-Tetracyc Swelling    Tongue swelling. Face tingling Other reaction(s): Unknown   Cephalexin Hives    Other reaction(s): hives   Ciprofloxacin Diarrhea   Codeine Nausea And Vomiting   Cyclobenzaprine Other (See Comments)    Tingly/prickly sensation. Other reaction(s): tingly/prickly sensation   Darvocet [Propoxyphene N-Acetaminophen] Nausea And Vomiting   Demerol [Meperidine] Nausea Only   Dexlansoprazole Swelling    Redness, swelling and peeling of both feet. Other reaction(s): foot pain   Diphedryl [Diphenhydramine] Other (See Comments)    Increased pulse/small amount ok   Doxycycline Hyclate Other (See Comments)    GI intolerance.   Doxycycline Hyclate     Other reaction(s): GI intolerance    Epinephrine Other (See Comments)    Breathing problems Other reaction(s): breathing problems/fainting   Erythromycin Other (See Comments)    GI intolerance. Other reaction(s): GI   Fish Oil     Other reaction(s): breathing problems/hives   Hyoscyamine     Other reaction(s): eye pain   Latex Other (See Comments)    Gloves ok.  Skin gets red from elastic in underwear and latex bandaides.   Meperidine Hcl     Other reaction(s): vomiting   Nitrofurantoin Diarrhea   Other     Other reaction(s): migraines Other reaction(s): Unknown Other reaction(s): Unknown Other reaction(s): Unknown Other reaction(s): increased pulse, faint, diarrhea   Prednisone Other (See Comments)    Headache Other reaction(s): headache   Ra Diphedryl Allergy [Diphenhydramine Hcl]     Other reaction(s): increased pulse small dose okay   Sertraline Hcl Swelling and Other (See Comments)    Migraine Swelling of tongue/lip (09/2012) Other reaction(s): Unknown   Shellfish-Derived Products     Other reaction(s): Unknown  Sulfa Antibiotics Other (See Comments)    Increased pulse, fainting, diarrhea, thrush   Wellbutrin [Bupropion] Other (See Comments)    Headaches   Xylocaine [Lidocaine Hcl]     With epinephrine, given by dentist.  Speeded up heart rate and she passed out (occured twice, at dentist)   Xylocaine [Lidocaine]     Other reaction(s): Unknown   Biaxin [Clarithromycin] Rash    Started after completing 10 day course of 2000 mg /day, Lips swelling   Ibuprofen Other (See Comments)    Motrin ok with a GI effect. Other reaction(s): rash Motrin okay with a GI effect   ROS: epigastric pain, belching and L sided complaints (head/jaw/neck/axilla) per HPI.   Off/on headaches, none currently. Has some runny nose on L side. Denies sinus pain, sore throat, cough. Denies exertional chest pain, but not moving much. Chronic pain on L side from prior rib fracture, and chronic back pain. Tremor is much better. She  denies any urinary complaints.    PHYSICAL EXAM:  BP 128/82   Pulse 74   Ht '4\' 11"'$  (1.499 m)   Wt 89 lb 12.8 oz (40.7 kg)   LMP  (LMP Unknown)   BMI 18.14 kg/m   Wt Readings from Last 3 Encounters:  08/09/22 89 lb 12.8 oz (40.7 kg)  07/12/22 90 lb 12.8 oz (41.2 kg)  07/06/22 92 lb 6.4 oz (41.9 kg)   Frail, elderly female, accompanied by her husband. She is not tearful today. She is alert and oriented, and doesn't appear to be in acute distress. Some discomfort with position changes on the exam table (ribs/back). HEENT: conjunctiva and sclera are clear, EOMI.  Nontender at temporalis muscles, temporal arteries, TMJ. Sinuses nontender.  OP clear Neck: no lymphadenopathy or mass Heart: regular rate and rhythm Lungs: clear bilaterally Back: no CVA tenderness Abdomen: +epigastric tenderness, no rebound or guarding.  Slightly tender in LUQ, nontender elsewhere. No organomegaly or mass.  Normal bowel sounds Extremities: no edema Neuro: mild tremor of RUE. Alert and oriented. Cranial nerves grossly intact.  Slow but normal gait. Psych: depressed, somewhat flat affect, normal hygiene and grooming, eye contact and speech.   ASSESSMENT/PLAN:  Gastroesophageal reflux disease, unspecified whether esophagitis present - encouraged to resume prilosec '20mg'$  once daily for at least 2 weeks. Proper diet reviewed  Bloating - none today, worse with eating, might possibly be related to her med. Bland diet, skip med tonight and see if bloating is less  Constipation, unspecified constipation type - and frequent diarrhea. Cont Align. Discussed high fiber diet, poss fiber supplements, Miralax dosing, and need for adequate water intake  Severe recurrent major depression without psychotic features ([place]) - under the care of psychiatrist and therapist.  Counseled today re: chronic pain, the need for adequate nutrition for energy and moods  Protein-calorie malnutrition, unspecified severity ([place]) -  Discussed impact on her health and well-being of inadequate caloric intake. Encouraged her to use shakes as snacks, not meal replacements  Please take omeprazole once daliy for the next 2 weeks (and can be longer, if you continue to get benefit from it, meaning that your symptoms recur when you stop it).  I recommend taking this before dinner or before bedtime.  Check with the pharmacist to see how long BEFORE you take the carbidopa/levodopa with dinner that you should take the omeprazole.  If it is too confusing, take it at bedtime, when you aren't taking any of the carbidopa/levadopa.  It is hard to determine if your bloating  and pain is related to eating food or from the medication you take prior to eating. Today you haven't done either, and you don't feel bloated. I encourage you to eat a meal today and NOT take the medication, to see how your stomach fares. Take the omeprazole this evening. Stick with a bland diet--chicken noodle soup, crackers, grilled chicken, apple.  Avoid too many bananas which can be constipating. Try eating prunes regularly, or drinking the prune juice daily. Consider re-trying a fiber supplement (a powder such as benefiber or metamucil), starting slow, at 1/3 to 1/2 the recommended dose, with LOTS of water.  Taking this daily can help bulk up loose stools, and help prevent constipation.  Please try and use lactose-free supplements as snacks, between meals, not in place of meals.  I spent 43 minutes dedicated to the care of this patient, including pre-visit review of records, face to face time, post-visit ordering of testing and documentation.

## 2022-08-09 ENCOUNTER — Encounter: Payer: Self-pay | Admitting: Family Medicine

## 2022-08-09 ENCOUNTER — Ambulatory Visit (INDEPENDENT_AMBULATORY_CARE_PROVIDER_SITE_OTHER): Payer: Medicare Other | Admitting: Family Medicine

## 2022-08-09 VITALS — BP 128/82 | HR 74 | Ht 59.0 in | Wt 89.8 lb

## 2022-08-09 DIAGNOSIS — K5900 Constipation, unspecified: Secondary | ICD-10-CM

## 2022-08-09 DIAGNOSIS — F332 Major depressive disorder, recurrent severe without psychotic features: Secondary | ICD-10-CM | POA: Diagnosis not present

## 2022-08-09 DIAGNOSIS — E46 Unspecified protein-calorie malnutrition: Secondary | ICD-10-CM | POA: Diagnosis not present

## 2022-08-09 DIAGNOSIS — R140 Abdominal distension (gaseous): Secondary | ICD-10-CM

## 2022-08-09 DIAGNOSIS — K219 Gastro-esophageal reflux disease without esophagitis: Secondary | ICD-10-CM | POA: Diagnosis not present

## 2022-08-09 NOTE — Patient Instructions (Signed)
  Please take omeprazole once daliy for the next 2 weeks (and can be longer, if you continue to get benefit from it, meaning that your symptoms recur when you stop it).  I recommend taking this before dinner or before bedtime.  Check with the pharmacist to see how long BEFORE you take the carbidopa/levodopa with dinner that you should take the omeprazole.  If it is too confusing, take it at bedtime, when you aren't taking any of the carbidopa/levadopa.  It is hard to determine if your bloating and pain is related to eating food or from the medication you take prior to eating. Today you haven't done either, and you don't feel bloated. I encourage you to eat a meal today and NOT take the medication, to see how your stomach fares. Take the omeprazole this evening. Stick with a bland diet--chicken noodle soup, crackers, grilled chicken, apple.  Avoid too many bananas which can be constipating. Try eating prunes regularly, or drinking the prune juice daily. Consider re-trying a fiber supplement (a powder such as benefiber or metamucil), starting slow, at 1/3 to 1/2 the recommended dose, with LOTS of water.  Taking this daily can help bulk up loose stools, and help prevent constipation.  Continue the miralax 1/2 capful every other day.  Please try and use lactose-free supplements as snacks, between meals, not in place of meals.

## 2022-08-15 ENCOUNTER — Encounter: Payer: Self-pay | Admitting: Family Medicine

## 2022-08-16 ENCOUNTER — Ambulatory Visit (INDEPENDENT_AMBULATORY_CARE_PROVIDER_SITE_OTHER): Payer: Medicare Other | Admitting: Psychiatry

## 2022-08-16 DIAGNOSIS — F411 Generalized anxiety disorder: Secondary | ICD-10-CM | POA: Diagnosis not present

## 2022-08-16 NOTE — Progress Notes (Signed)
[facility] Counselor/Therapist Progress Note  Patient ID: Tracy Malone, MRN: [id number],    Date: 08/16/2022  Time Spent: 55 minutes   Treatment Type: Individual Therapy  Reported Symptoms: anxiety, depression, frustration  Mental Status Exam:  Appearance:   Casual     Behavior:  Appropriate, Sharing, and Motivated  Motor:  Uses rollator to walk  Speech/Language:   Clear and Coherent  Affect:  Depressed and anxious  Mood:  anxious and depressed  Thought process:  goal directed  Thought content:    Some obsessive thoughts, rumination  Sensory/Perceptual disturbances:    WNL  Orientation:  oriented to person, place, time/date, situation, day of week, month of year, year, and stated date of Jan. 24, 2024  Attention:  Good  Concentration:  Good  Memory:  WNL  Fund of knowledge:   Good  Insight:    Good and Fair  Judgment:   Good  Impulse Control:  Good   Risk Assessment: Danger to Self:  No Self-injurious Behavior: No Danger to Others: No Duty to Warn:no Physical Aggression / Violence:No  Access to Firearms a concern: No  Gang Involvement:No   Subjective:  Patient in today reporting anxiety as her main symptom along with depression. Denies any SI. Has been more patient with myself but not more patient with others. States daughter has been closer with her recently and patient appreciates it but does tend to second-guess some behaviors versus more acceptance. Worked today more on her tendency to always see "dangers or the negatives" (as related to triangle diagram) and patient feels her responses are very influenced by her Parkinson's which affects her pain level and her mood/outlook. Processed this more with patient, including looking at ways to view situations/circumstances in ways that she is not always looking for what may go wrong versus right and in ways to feel less danger/fearfulness. Acknowledges some behaviors that feed fearfulness and looking at things from a  danger point of view versus ever having confidence and trust that she can cope with situations/changes and not cling as much to "what might be of danger". Frustrated with trying to eat healthy per what she feels and what doctors feel and at times they don't agree. Patient states she doesn't ask for clarification and I've encouraged her to ask when needed as her compliance may be better with their recommendations if she understands them better.   Interventions: Cognitive Behavioral Therapy and Ego-Supportive  Long term goal: Develop the ability to recognize, accept, and cope with feelings of anxiety and depression. Short term goal: Verbalize any unresolved grief issues that may be contributing to anxiety and depression. Strategy: Replace negative self-defeating self talk with verbalization of realistic and positive cognitive messages to lessen depression/anxiety and improve mood.  Diagnosis:   ICD-10-CM   1. Generalized anxiety disorder  F41.1      Plan:  Patient today did show improved efforts as she talked through issues in session especially working on her anxiety.  Trying to separate out "what I can control and what I cannot control".  And showing more motivation today.  Seems like she felt better physically today.  Smiled more.  Daughter brought her to the session but sat out in the lobby.  Is trying to be healthier and some of her habits including eating healthier, having more person-to-person contacts, getting enough sleep, and following up on all doctor recommendations.  Today he is not looking as stressed and reports that she did sleep better last  night.  Is concerned about her aging husband in his 4s and his declining health.  Processed some of her specific thoughts and feelings about this which is very difficult for patient.  Very happy with the 5 days a week caretaker that she and husband have as she is very personable, approachable, and attentive to their needs.  Continue to encourage  patient to remain in contact with the supportive people from her church and trying not to be isolated from others, making phone calls as she feels able.  Outlook today seemed more positive even with her health challenges and her concerns about her husband's health. Encouraged patient to practice positive behaviors as noted in session including: Following through on her doctors suggestions, stay on medications as prescribed, recognize the strength she shows in getting through some tough times, refrain from assuming worst-case scenarios and all the negatives, refer to the list that we worked on previously that patient has said helps her to "get centered" whenever she is anxious and stressed, use list we worked on as a reminder for the positives and not to focus only on the negatives, participate in activities that she enjoys and is able to do physically which she has admitted helps her feel better when she does, continue phone calls with friends, music, positive self talk and self-care, reading, occasionally getting out of the house as she is able, and recognize the strength she shows when working with goal-directed behaviors to move in a direction that supports her improved emotional health and wellbeing.  Goal review and progress/challenges noted with patient.  Next appointment within 3 weeks.  This record has been created using [employer].  Chart creation errors have been sought, but may not always have been located and corrected.  Such creation errors do not reflect on the standard of medical care provided.   Shanon Ace, LCSW

## 2022-08-23 ENCOUNTER — Ambulatory Visit (INDEPENDENT_AMBULATORY_CARE_PROVIDER_SITE_OTHER): Payer: Medicare Other | Admitting: Sports Medicine

## 2022-08-23 VITALS — HR 72 | Ht 59.0 in | Wt 89.0 lb

## 2022-08-23 DIAGNOSIS — R29898 Other symptoms and signs involving the musculoskeletal system: Secondary | ICD-10-CM

## 2022-08-23 DIAGNOSIS — M5442 Lumbago with sciatica, left side: Secondary | ICD-10-CM | POA: Diagnosis not present

## 2022-08-23 DIAGNOSIS — M5136 Other intervertebral disc degeneration, lumbar region: Secondary | ICD-10-CM

## 2022-08-23 DIAGNOSIS — G8929 Other chronic pain: Secondary | ICD-10-CM | POA: Diagnosis not present

## 2022-08-23 MED ORDER — METHYLPREDNISOLONE 4 MG PO TBPK: ORAL_TABLET | ORAL | 0 refills | Status: DC

## 2022-08-23 NOTE — Patient Instructions (Addendum)
Good to see you Prednisone dos pak  Tylenol [phone] mg 2-3 times a day for pain relief  Low back HEP  MRI referral  Follow up 3 days after MRI to discuss your results

## 2022-08-23 NOTE — Progress Notes (Signed)
Tracy Malone D.[place] Phone: [phone]   Assessment and Plan:     1. Chronic left-sided low back pain with left-sided sciatica 2. DDD (degenerative disc disease), lumbar 3.  Left leg weakness - Chronic with exacerbation, subsequent visit - Patient has had progressively worse low back pain with worsening left leg radicular symptoms including worsening left leg weakness - Patient has failed conservative therapy for >6 weeks, has red flag signs of progressive weakness into left leg, has pain frequently >6/10, pain that is limiting day-to-day activity, previous x-ray and MRI images, so at this time I feel it is necessary to proceed with lumbar MRI to further evaluate - Start prednisone Dosepak - May use Tylenol 500 to 1000 mg 2-3 times daily as needed for pain control - If pain becomes more manageable, can start HEP - MR Lumbar Spine Wo Contrast; Future  Other orders - methylPREDNISolone (MEDROL DOSEPAK) 4 MG TBPK tablet; Take 6 tablets on day 1.  Take 5 tablets on day 2.  Take 4 tablets on day 3.  Take 3 tablets on day 4.  Take 2 tablets on day 5.  Take 1 tablet on day 6.    Pertinent previous records reviewed include MRI lumbar spine 02/28/2021, CT abdomen pelvis 04/20/2022, CT abdomen pelvis 06/29/2021   Follow Up: 3 days after MRI to review results and discuss treatment plan.  If current course is not sufficient in improving patient's symptoms, patient may require epidural CSI based on MRI results.  Patient is hesitant to have epidural performed, but would consider it if there are no other options.  Patient is not interested in surgery.   Subjective:   I, Tracy Malone, am serving as a Education administrator for Doctor Glennon Mac   Chief Complaint: left side back pain   HPI:  10/13/2021 Patient is a 89 year old female complaining of left side back pain. Patient states had surgery on back 2 years ago pcp thinks its  a muscle pain got a nerve block and fell and fx her ribs a year ago, back pain has been bothering her for about 2 years from PT had a bad experience does get radiating pain down to her leg feels like a spasm cant sit for more than 30 mins laying Is awful standing isnt great either pain also radiate around to the front would like PT    12/21/2021 Patient states that she had a bad night last night but went to PT this morning Tens unit really helped , is having some sciatic pain , really enjoys aquatic therapy    01/30/2022 Patient states would like to continue with water therapy and the other , still sore from her rib pain when she fell    03/07/2022 Patient states she is doing okay, wants to talk about her neck thinks she strained it a couple of weeks ago   08/23/2022 Patient states she does not want an epidural , she want a good eval , she has low back pain  since the last time she was here , ice made the pain worse, pain radiates down her leg and to her heal , ribs are still sore from her fall last time , quit water therapy , would like a referral to water therapy if possible , isnt able to lift her legs to cross     Relevant Historical Information: History of L1 vertebral fracture, Parkinson's with essential tremor  Additional pertinent  review of systems negative.   Current Outpatient Medications:    ALPRAZolam (XANAX) 0.5 MG tablet, TAKE FOUR TABLETS BY MOUTH DAILY AS NEEDED FOR ANXIETY, Disp: 120 tablet, Rfl: 0   Carbidopa-Levodopa ER (SINEMET CR) 25-100 MG tablet controlled release, TAKE ONE TABLET THREE TIMES DAILY, Disp: 270 tablet, Rfl: 3   dicyclomine (BENTYL) 10 MG capsule, TAKE 1 CAPSULE BY MOUTH EVERY 6 HOURS AS NEEDED, Disp: 30 capsule, Rfl: 6   docusate sodium (COLACE) 100 MG capsule, Take 1 capsule (100 mg total) by mouth 2 (two) times daily as needed for mild constipation., Disp: 60 capsule, Rfl: 0   FLUoxetine (PROZAC) 10 MG tablet, Take 10 mg by mouth daily., Disp: , Rfl:     hydrOXYzine (ATARAX) 10 MG tablet, Take 1 tablet (10 mg total) by mouth at bedtime., Disp: 30 tablet, Rfl: 0   loperamide (IMODIUM) 2 MG capsule, Take 2 mg by mouth as needed for diarrhea or loose stools., Disp: , Rfl:    methocarbamol (ROBAXIN) 500 MG tablet, One po qHS prn, Disp: 30 tablet, Rfl: 2   methylPREDNISolone (MEDROL DOSEPAK) 4 MG TBPK tablet, Take 6 tablets on day 1.  Take 5 tablets on day 2.  Take 4 tablets on day 3.  Take 3 tablets on day 4.  Take 2 tablets on day 5.  Take 1 tablet on day 6., Disp: 21 tablet, Rfl: 0   omeprazole (PRILOSEC) 20 MG capsule, Take 1 capsule (20 mg total) by mouth daily. Take before a meal, Disp: 1 capsule, Rfl: 0   polyethylene glycol (MIRALAX) 17 g packet, Take 17 g by mouth daily as needed., Disp: 14 each, Rfl: 0   SYNTHROID 25 MCG tablet, Take 1 tablet Monday through Saturday, skipping Sundays., Disp: 90 tablet, Rfl: 0   Objective:     Vitals:   08/23/22 1423  Pulse: 72  SpO2: 99%  Weight: 89 lb (40.4 kg)  Height: '4\' 11"'$  (1.499 m)      Body mass index is 17.98 kg/m.    Physical Exam:    Gen: Appears well, nad, nontoxic and pleasant Psych: Alert and oriented, appropriate mood and affect Neuro: strength globally 4/5 in left lower extremity and otherwise strength is 5/5 in upper and lower extremities, muscle tone wnl Skin: no susupicious lesions or rashes  Back - Normal skin, Spine with normal alignment and no deformity.   No tenderness to vertebral process palpation.   Left lumbar paraspinous muscles are moderately tender and without spasm TTP left gluteal musculature Straight leg raise positive left, negative right  Piriformis Test positive left, negative right   Electronically signed by:  Tracy Malone D.[facility] 2:57 PM 08/23/22

## 2022-08-23 NOTE — Progress Notes (Signed)
Chief Complaint  Patient presents with   Med check    2 week follow up, she saw Dr. Glennon Mac yesterday. She is not doing much better today-very tired. Felt good one day out of this week.    Patient presents for 2 week follow-up. At that visit she was having epigastric discomfort, extending to her lower chest, with belching. She had some pain at he L face/neck/ear/jaw at one point, not present at visit.  She had 2 weeks of pain/nausea/belching off/on.  She was advised to take omeprazole daily for 2 weeks, before dinner or before bedtime.  Today she reports her stomach feels swollen, not painful.  No longer having the belching or epigastric pain. She took omeprazole x 4 days. She states it only helped her stomach one day, but caused her to have a headache. She switched to gaviscon and Tums every night  Stomach feels a little better.  Swollen feeling is just on her left side.  She has had regular bowel movements for the last 5 mornings, normal consistency.  She deals with constipation and loose stools frequently.  She has been taking miralax 1/2 capful every other day. At last visit, on the same regimen, she reported she sometimes will go 3-4 days without a bowel movement, and about 2x/week sees hard stools (small balls). Her appetite has improved some, and she has been eating more than at her last visit. She is eating more per meal, and having a protein shake daily.  She is not bloating as much after eating as she was at her last visit. Her bowels have been regular, as reported above. She continues to [employer] daily.  She had sent messages about vitamins, and is asking about her calcium.  The pills are very large, hard to swallow.  Since she has been taking Tums, is asking if she still needs to take it--she likes Tums better. She drinks 2 cups of Lactaid milk daily.  She saw Dr. Glennon Mac yesterday with progressively worsening low back pain, with left-sided radicular symptoms, including worsening left leg  weakness. MRI was ordered. He put her on a steroid dose-pack--she hasn't started it yet. Her main complaint of pain to me is at her L ribs (but also her back).   PMH, PSH, SH reviewed  Outpatient Encounter Medications as of 08/24/2022  Medication Sig Note   ALPRAZolam (XANAX) 0.5 MG tablet TAKE FOUR TABLETS BY MOUTH DAILY AS NEEDED FOR ANXIETY 08/24/2022: Took one at 10:30am   Carbidopa-Levodopa ER (SINEMET CR) 25-100 MG tablet controlled release TAKE ONE TABLET THREE TIMES DAILY    SYNTHROID 25 MCG tablet Take 1 tablet Monday through Saturday, skipping Sundays.    dicyclomine (BENTYL) 10 MG capsule TAKE 1 CAPSULE BY MOUTH EVERY 6 HOURS AS NEEDED (Patient not taking: Reported on 08/24/2022) 07/06/2022: prn   docusate sodium (COLACE) 100 MG capsule Take 1 capsule (100 mg total) by mouth 2 (two) times daily as needed for mild constipation. (Patient not taking: Reported on 08/24/2022) 07/06/2022: prn   FLUoxetine (PROZAC) 10 MG tablet Take 10 mg by mouth daily. (Patient not taking: Reported on 08/24/2022)    hydrOXYzine (ATARAX) 10 MG tablet Take 1 tablet (10 mg total) by mouth at bedtime. (Patient not taking: Reported on 08/24/2022) 07/06/2022: prn   loperamide (IMODIUM) 2 MG capsule Take 2 mg by mouth as needed for diarrhea or loose stools. (Patient not taking: Reported on 08/24/2022) 07/12/2022: prn   methocarbamol (ROBAXIN) 500 MG tablet One po qHS prn (Patient not taking:  Reported on 08/24/2022) 08/24/2022: As needed   methylPREDNISolone (MEDROL DOSEPAK) 4 MG TBPK tablet Take 6 tablets on day 1.  Take 5 tablets on day 2.  Take 4 tablets on day 3.  Take 3 tablets on day 4.  Take 2 tablets on day 5.  Take 1 tablet on day 6. (Patient not taking: Reported on 08/24/2022) 08/24/2022: Has not started yet   omeprazole (PRILOSEC) 20 MG capsule Take 1 capsule (20 mg total) by mouth daily. Take before a meal (Patient not taking: Reported on 08/24/2022) 07/06/2022: prn   polyethylene glycol (MIRALAX) 17 g packet Take 17 g by mouth  daily as needed. (Patient not taking: Reported on 08/24/2022) 07/06/2022: prn   No facility-administered encounter medications on file as of 08/24/2022.   Allergies  Allergen Reactions   Iodinated Contrast Media Anaphylaxis   Iodine Anaphylaxis    IV and topical forms. Other reaction(s): Unknown   Levsin [Hyoscyamine Sulfate]     Vision problems/pt has glaucoma   Salmon [Fish Allergy] Hives and Shortness Of Breath   Shellfish Allergy Anaphylaxis   Tramadol Swelling   Remeron [Mirtazapine] Other (See Comments)    Cause blurred vision and red eyes, pt has glaucoma   Aspirin Other (See Comments)    Sever stomach pain due to ulcer scaring.   Bis Subcit-Metronid-Tetracyc Swelling    Tongue swelling. Face tingling Other reaction(s): Unknown   Cephalexin Hives    Other reaction(s): hives   Ciprofloxacin Diarrhea   Codeine Nausea And Vomiting   Cyclobenzaprine Other (See Comments)    Tingly/prickly sensation. Other reaction(s): tingly/prickly sensation   Darvocet [Propoxyphene N-Acetaminophen] Nausea And Vomiting   Demerol [Meperidine] Nausea Only   Dexlansoprazole Swelling    Redness, swelling and peeling of both feet. Other reaction(s): foot pain   Diphedryl [Diphenhydramine] Other (See Comments)    Increased pulse/small amount ok   Doxycycline Hyclate Other (See Comments)    GI intolerance.   Doxycycline Hyclate     Other reaction(s): GI intolerance   Epinephrine Other (See Comments)    Breathing problems Other reaction(s): breathing problems/fainting   Erythromycin Other (See Comments)    GI intolerance. Other reaction(s): GI   Fish Oil     Other reaction(s): breathing problems/hives   Hyoscyamine     Other reaction(s): eye pain   Latex Other (See Comments)    Gloves ok.  Skin gets red from elastic in underwear and latex bandaides.   Meperidine Hcl     Other reaction(s): vomiting   Nitrofurantoin Diarrhea   Other     Other reaction(s): migraines Other reaction(s):  Unknown Other reaction(s): Unknown Other reaction(s): Unknown Other reaction(s): increased pulse, faint, diarrhea   Prednisone Other (See Comments)    Headache Other reaction(s): headache   Ra Diphedryl Allergy [Diphenhydramine Hcl]     Other reaction(s): increased pulse small dose okay   Sertraline Hcl Swelling and Other (See Comments)    Migraine Swelling of tongue/lip (09/2012) Other reaction(s): Unknown   Shellfish-Derived Products     Other reaction(s): Unknown   Sulfa Antibiotics Other (See Comments)    Increased pulse, fainting, diarrhea, thrush   Wellbutrin [Bupropion] Other (See Comments)    Headaches   Xylocaine [Lidocaine Hcl]     With epinephrine, given by dentist.  Speeded up heart rate and she passed out (occured twice, at dentist)   Xylocaine [Lidocaine]     Other reaction(s): Unknown   Biaxin [Clarithromycin] Rash    Started after completing 10 day course of  2000 mg /day, Lips swelling   Ibuprofen Other (See Comments)    Motrin ok with a GI effect. Other reaction(s): rash Motrin okay with a GI effect    ROS: No fever, chills.  Bowels are improved, less bloating, no pain, belching/heartburn, and appetite a little better. No n/v/d, constipation improved.  No URI symptoms. Chronic pain on L side from prior rib fracture, and chronic back pain. Tremor is unchanged. She denies any urinary complaints.   PHYSICAL EXAM:  BP 118/68   Pulse 80   Ht '4\' 11"'$  (1.499 m)   Wt 89 lb 14.4 oz (40.8 kg)   LMP  (LMP Unknown)   BMI 18.16 kg/m   Wt Readings from Last 3 Encounters:  08/24/22 89 lb 14.4 oz (40.8 kg)  08/23/22 89 lb (40.4 kg)  08/09/22 89 lb 12.8 oz (40.7 kg)   Frail, elderly female, accompanied by her husband. She is in slightly better spirits compared to prior visits. She is alert and oriented, in no distress. Some discomfort with position changes on the exam table (ribs/back). HEENT: conjunctiva and sclera are clear, EOMI. Neck: no lymphadenopathy,  thyromegaly or mass Heart: regular rate and rhythm Lungs: clear bilaterally Chest: tender at left inferior ribs, anteriorly and laterally Back: no CVA tenderness Abdomen: Soft, nontender, normal bowel sounds.  No epigastric tenderness.  Minimal discomfort at L side.  No rebound, guarding, mass or organomegaly. Extremities: no edema Neuro: mild tremor of RUE. Alert and oriented. Cranial nerves grossly intact.  Slow but normal gait. Psych: depressed, somewhat flat affect, normal hygiene and grooming, eye contact and speech.   ASSESSMENT/PLAN:  Gastroesophageal reflux disease, unspecified whether esophagitis present - Improved; didn't tolerate omeprazole d/t HA. If any recurrent pain/sx, rec pepcid. OK to use Tums prn  Constipation, unspecified constipation type - currently doing well with qod Miralax, and improved intake. High fiber diet rec, adequate water. Discussed Ca and constipation  Protein-calorie malnutrition, unspecified severity ([place]) - No further loss, not gaining yet, but intake and appetite improved. Cont supplements/shakes  Low back pain, unspecified back pain laterality, unspecified chronicity, unspecified whether sciatica present - chronic LBP; MRI planned. Hasn't started steroids--risks/SE reviewed in detail, to help with compliance  Left-sided chest wall pain - chronic; reviewed topical therapies, tylenol prn  Osteoporosis of multiple sites - monitored/managed by GYN; reviewed calcium recommendations--trying to get more from diet; switching to Tums eliminates D, would need MVI  Discussed calcium recommendations in detail, in this patient with osteoporosis. Prefer her to get as much from her diet as possible (since calcium is constipating, she doesn't like the large pills).  Tums prn is fine, wouldn't take the place of Ca with D, as there isn't any D.  We had answered questions re: MVI via MyChart, and discussed these again (to ensure she gets 1000 IU of D3)--using the mini's  (of Centrum Silver for women, reminding her that she needs to take TWO), vs gummies, etc.  Reviewed side effects of steroids, to help her stay compliant with the course. All questions were answered.  I spent 40 minutes dedicated to the care of this patient, including pre-visit review of records, face to face time, post-visit ordering of testing and documentation.   If you develop upper abdominal pain, since you cannot tolerate the omeprazole (you said it caused a headache), you can try taking famotidine (which is Pepcid), over the counter. The steroid pills sometimes can bother peoples stomach.  Continue to eat frequently, with protein shakes daily.

## 2022-08-24 ENCOUNTER — Encounter: Payer: Self-pay | Admitting: Family Medicine

## 2022-08-24 ENCOUNTER — Ambulatory Visit (INDEPENDENT_AMBULATORY_CARE_PROVIDER_SITE_OTHER): Payer: Medicare Other | Admitting: Family Medicine

## 2022-08-24 VITALS — BP 118/68 | HR 80 | Ht 59.0 in | Wt 89.9 lb

## 2022-08-24 DIAGNOSIS — K219 Gastro-esophageal reflux disease without esophagitis: Secondary | ICD-10-CM

## 2022-08-24 DIAGNOSIS — E46 Unspecified protein-calorie malnutrition: Secondary | ICD-10-CM | POA: Diagnosis not present

## 2022-08-24 DIAGNOSIS — M810 Age-related osteoporosis without current pathological fracture: Secondary | ICD-10-CM

## 2022-08-24 DIAGNOSIS — M545 Low back pain, unspecified: Secondary | ICD-10-CM | POA: Diagnosis not present

## 2022-08-24 DIAGNOSIS — R0789 Other chest pain: Secondary | ICD-10-CM | POA: Diagnosis not present

## 2022-08-24 DIAGNOSIS — K5900 Constipation, unspecified: Secondary | ICD-10-CM

## 2022-08-24 NOTE — Patient Instructions (Signed)
  If you develop upper abdominal pain, since you cannot tolerate the omeprazole (you said it caused a headache), you can try taking famotidine (which is Pepcid), over the counter. The steroid pills sometimes can bother peoples stomach.  Continue to eat frequently, with protein shakes daily.

## 2022-08-29 ENCOUNTER — Telehealth: Payer: Self-pay | Admitting: Sports Medicine

## 2022-08-29 ENCOUNTER — Other Ambulatory Visit: Payer: Self-pay | Admitting: Sports Medicine

## 2022-08-29 DIAGNOSIS — J342 Deviated nasal septum: Secondary | ICD-10-CM | POA: Diagnosis not present

## 2022-08-29 DIAGNOSIS — M51369 Other intervertebral disc degeneration, lumbar region without mention of lumbar back pain or lower extremity pain: Secondary | ICD-10-CM

## 2022-08-29 DIAGNOSIS — G8929 Other chronic pain: Secondary | ICD-10-CM

## 2022-08-29 DIAGNOSIS — S2242XS Multiple fractures of ribs, left side, sequela: Secondary | ICD-10-CM

## 2022-08-29 DIAGNOSIS — R29898 Other symptoms and signs involving the musculoskeletal system: Secondary | ICD-10-CM

## 2022-08-29 DIAGNOSIS — J3489 Other specified disorders of nose and nasal sinuses: Secondary | ICD-10-CM | POA: Diagnosis not present

## 2022-08-29 DIAGNOSIS — M5136 Other intervertebral disc degeneration, lumbar region: Secondary | ICD-10-CM

## 2022-08-29 DIAGNOSIS — M1612 Unilateral primary osteoarthritis, left hip: Secondary | ICD-10-CM

## 2022-08-29 DIAGNOSIS — H6121 Impacted cerumen, right ear: Secondary | ICD-10-CM | POA: Diagnosis not present

## 2022-08-29 DIAGNOSIS — R0781 Pleurodynia: Secondary | ICD-10-CM

## 2022-08-29 NOTE — Telephone Encounter (Signed)
MRI ordered to [facility] BUT pt able to get in this Friday at [facility].  Can we send order/change location so she can get this done at [facility]?

## 2022-08-29 NOTE — Telephone Encounter (Signed)
Patient called after hours and left a message in reference to her MRI. She is scheduled at [facility] for 2/22 but would like to have it done somewhere else sooner if possible. She asked if [facility] would be a possibility?   She also said that the medication that Dr Glennon Mac prescribed made her sick on her stomach so she is not going to be taking that anymore.  Please advise.

## 2022-08-29 NOTE — Telephone Encounter (Signed)
Messaged pt via mychart.

## 2022-08-29 NOTE — Telephone Encounter (Signed)
Already has been sent to [facility] and no Josem Kaufmann is required for patients insurance

## 2022-08-29 NOTE — Progress Notes (Unsigned)
Referral to [facility] was placed

## 2022-08-31 ENCOUNTER — Encounter (HOSPITAL_BASED_OUTPATIENT_CLINIC_OR_DEPARTMENT_OTHER): Payer: Self-pay

## 2022-09-01 ENCOUNTER — Ambulatory Visit (HOSPITAL_BASED_OUTPATIENT_CLINIC_OR_DEPARTMENT_OTHER)
Admission: RE | Admit: 2022-09-01 | Discharge: 2022-09-01 | Disposition: A | Discharge status: Home / Self Care | Payer: Medicare Other | Source: Ambulatory Visit | Attending: Sports Medicine | Admitting: Sports Medicine

## 2022-09-01 DIAGNOSIS — M1612 Unilateral primary osteoarthritis, left hip: Secondary | ICD-10-CM

## 2022-09-01 DIAGNOSIS — M5136 Other intervertebral disc degeneration, lumbar region: Secondary | ICD-10-CM | POA: Insufficient documentation

## 2022-09-01 DIAGNOSIS — M5442 Lumbago with sciatica, left side: Secondary | ICD-10-CM | POA: Diagnosis not present

## 2022-09-01 DIAGNOSIS — R29898 Other symptoms and signs involving the musculoskeletal system: Secondary | ICD-10-CM | POA: Diagnosis not present

## 2022-09-01 DIAGNOSIS — X58XXXS Exposure to other specified factors, sequela: Secondary | ICD-10-CM | POA: Insufficient documentation

## 2022-09-01 DIAGNOSIS — M545 Low back pain, unspecified: Secondary | ICD-10-CM | POA: Diagnosis not present

## 2022-09-01 DIAGNOSIS — M48061 Spinal stenosis, lumbar region without neurogenic claudication: Secondary | ICD-10-CM | POA: Insufficient documentation

## 2022-09-01 DIAGNOSIS — M47816 Spondylosis without myelopathy or radiculopathy, lumbar region: Secondary | ICD-10-CM | POA: Diagnosis not present

## 2022-09-01 DIAGNOSIS — G8929 Other chronic pain: Secondary | ICD-10-CM | POA: Diagnosis not present

## 2022-09-01 DIAGNOSIS — S2242XS Multiple fractures of ribs, left side, sequela: Secondary | ICD-10-CM | POA: Diagnosis not present

## 2022-09-01 DIAGNOSIS — M4316 Spondylolisthesis, lumbar region: Secondary | ICD-10-CM | POA: Diagnosis not present

## 2022-09-01 DIAGNOSIS — R0781 Pleurodynia: Secondary | ICD-10-CM | POA: Diagnosis not present

## 2022-09-04 ENCOUNTER — Ambulatory Visit: Payer: Medicare Other | Admitting: Adult Health

## 2022-09-04 ENCOUNTER — Telehealth: Payer: Self-pay | Admitting: Sports Medicine

## 2022-09-04 NOTE — Telephone Encounter (Signed)
Patient asked if we could call her back with her MRI results before scheduling her visit.

## 2022-09-04 NOTE — Telephone Encounter (Signed)
Patient called in response to her MRI results.  She was trying to schedule an appointment to come in but the soonest she could come would be Friday and the time we have available did not work for her husband to drive her. Could we call her with Dr Marisue Brooklyn thoughts on the MRI and go from them to see what next steps might be? She is still having a lot of pain.

## 2022-09-05 ENCOUNTER — Ambulatory Visit (INDEPENDENT_AMBULATORY_CARE_PROVIDER_SITE_OTHER): Payer: Medicare Other | Admitting: Psychiatry

## 2022-09-05 DIAGNOSIS — F331 Major depressive disorder, recurrent, moderate: Secondary | ICD-10-CM | POA: Diagnosis not present

## 2022-09-05 NOTE — Progress Notes (Signed)
[facility] Counselor/Therapist Progress Note  Patient ID: Tracy CABRALES, MRN: [id number],    Date: 09/05/2022  Time Spent: 55 minutes  Treatment Type: Individual Therapy  Reported Symptoms: depression, anxiety, sad  Mental Status Exam:  Appearance:   Casual and Neat     Behavior:  Appropriate, Sharing, and Motivated  Motor:  Slowed some by her Parkinson's, uses cane  Speech/Language:   Clear and Coherent  Affect:  Depressed and anxious  Mood:  anxious and depressed  Thought process:  goal directed  Thought content:    Rumination  Sensory/Perceptual disturbances:    WNL  Orientation:  oriented to person, place, time/date, situation, day of week, month of year, year, and stated date of Feb. 13, 2024  Attention:  Good  Concentration:  Good  Memory:  WNL  Fund of knowledge:   Good  Insight:    Good and Fair  Judgment:   Good  Impulse Control:  Good   Risk Assessment: Danger to Self:  No Self-injurious Behavior: No Danger to Others: No Duty to Warn:no Physical Aggression / Violence:No  Access to Firearms a concern: No  Gang Involvement:No   Subjective:  Patient in today reporting anxiety, depression and focused mainly on her getting older, her health concerns, and some worsening "due to my age". States "when I don't sleep, I'm at my worst physically and emotionally. " Discussed her being able to sleep better and she states she" sleeps better when watching less TV especially News, scary movies, and anything upsetting." Focusing more on "letting go" of hurts in the past and able to name several that were very impactful to patient. "Wants to work on letting go and also letting go of feeling negative about so many things, old feelings and feeling I didn't do enough for "this and that". Daughter still checking in on her and husband.  Worked on some of her sadness and also some communication skills that can help her and husband, and patient seemed to make some progress that she  feels she can hold onto.  Patient talks in a very soft and low tone and husband's hearing is getting worse.  Reviewed some of the work we did last session regarding negatives and how her Parkinson's disease affects her mind/outlook.  Will refocus on that some more next session and build up on her work today on communication.  Trying to look more for what might go right versus wrong and not assume worst-case scenarios.  Fearfulness does not seem as strong today.  Concerned about an MRI she had done recently on her back and does not want more injections which she states that she has told her doctor.  Interventions: Cognitive Behavioral Therapy and Ego-Supportive  Long term goal: Develop the ability to recognize, accept, and cope with feelings of anxiety and depression. Short term goal: Verbalize any unresolved grief issues that may be contributing to anxiety and depression. Strategy: Replace negative self-defeating self talk with verbalization of realistic and positive cognitive messages to lessen depression/anxiety and improve mood.  Diagnosis:   ICD-10-CM   1. Major depressive disorder, recurrent episode, moderate (HCC)  F33.1      Plan: Patient today participating well in session and showing motivation as she focused on her depression and anxiety mostly related to "my getting older, my health concerns, worsening due to my age, and concerns for her husband and his health.  Seem to be having a better day today even though she showed some tearfulness she  also showed more hope despite her health challenges.  Trying to eat healthier.  Very happy with the caretaker they have 5 days a week.  Outlook seems more positive.  Making progress and continues to work with goal-directed behaviors to keep moving in a more hopeful forward direction. Encouraged patient to work on practicing positive behaviors as noted in session including: Staying on her medications as prescribed, following through on any of her  doctors suggestions, recognize the strengths she shows in getting through some tough times, refrain from assuming worst-case scenarios, refer to the list that we worked on previously that patient has said helps her to "get centered" whenever she is anxious and stressed, use list we worked on as a reminder for the positives and not to focus only on the negatives, participate in activities that she enjoys and is able to do physically which she has admitted helps her feel better when she does, continue phone calls with friends, music, positive self talk and self-care, reading, occasionally getting out of the house that she is able and realize the strength she shows working with goal-directed behaviors to move in a direction that supports her improved emotional health and outlook.  Goal review and progress/challenges noted with patient.  Next appointment within 2-3 weeks.  This record has been created using [employer].  Chart creation errors have been sought, but may not always have been located and corrected.  Such creation errors do not reflect on the standard of medical care provided.   Shanon Ace, LCSW

## 2022-09-06 ENCOUNTER — Telehealth: Payer: Self-pay

## 2022-09-06 NOTE — Telephone Encounter (Signed)
Appointment scheduled

## 2022-09-06 NOTE — Progress Notes (Signed)
Care Management & Coordination Services Pharmacy Team  Reason for Encounter: General adherence update   Contacted patient for general health update and medication adherence call.  Spoke with patient on 09/13/2022    What concerns do you have about your medications?Patient reports none. Patient states she had not been taking her prozac but plans to start again very soon.  The patient denies side effects with their medications.   How often do you forget or accidentally miss a dose? Never  Do you use a pillbox? No  Are you having any problems getting your medications from your pharmacy? No  Has the cost of your medications been a concern? No  The patient has not had an ED visit since last contact.   The patient reports the following problems with their health.  Patient reports she has been dealing with some hip pain and cramping. She reports she has an appointment on today to follow up with this as it is really bothering her and affecting her appetite she has been making herself eat because she isn't feeling good with her hip. She also reports some weight loss. Patient reports she will call to the office if after this appointment she is not feeling better. Patient declines follow up with Pharmacist at this time.  Patient denies concerns or questions for Theo Dills, PharmD at this time.   Counseled patient on: Access to carecoordination team for any cost, medication or pharmacy concerns.   Chart Updates:  Recent office visits:  08/24/22 [name], MD - Clinical Support Encounter for GERD and other concerns.  08/09/22 [name], MD - Patient presented for GERD and other concerns. No medication changes.  07/12/22 [name], MD - Patient presented for Abdominal pain and other concerns. No medication changes.  07/06/22 [name], MD - Patient presented for Diarrhea unspecified and other concerns. Advised to use Monistat or Zinc oxide cream.  06/30/22 Early, Coralee Pesa, NP - Patient  presented for Acute cystitis with hematuria and other concerns. Rocephin Injection Administered.   Recent consult visits:  09/07/22 Mozingo, Berdie Ogren [facility]) - Patient presented for Major depressive disorder and other concerns. No other visit details available.   09/05/22 Shanon Ace LCSW [facility]) - Patient presented for Major depressive disorder and other concerns. No other visit details available.   09/01/22 Patient presented to [facility] on 09/01/22 for MR Lumbar Spine WO Contrast.   08/29/22 Spainhour, Ann Lions, PA-C (ENT) - Patient presented for Nasal septal deviation and other concerns. No medication changes.   08/23/22 Glennon Mac, DO (Sports Med) - Patient presented for Chronic left sided low back pain with left sided sciatica and other concerns. No medication changes.   08/16/22  Shanon Ace LCSW [facility]) - Patient presented for Generalized anxiety disorder. No other visit details available.   07/26/22 Shanon Ace LCSW [facility]) - Patient presented for Major Depressive disorder and other concerns. No other visit details available.   07/13/22  Shanon Ace LCSW [facility]) - Patient presented for Generalized anxiety disorder. No other visit details available.  06/29/22 Shanon Ace LCSW [facility]) - Patient presented for Major Depressive disorder and other concerns. No other visit details available.   06/21/22 Shanon Ace LCSW [facility]) - Patient presented for Generalized anxiety disorder. No other visit details available.   06/07/22 Shanon Ace LCSW [facility]) - Patient presented for Generalized anxiety disorder. No other visit details available.  Hospital  visits:  Medication Reconciliation was completed by comparing discharge summary, patient's EMR and Pharmacy list, and upon discussion with patient.  Patient presented to [facility] ED on 04/20/22 due  to Abdominal Pain unspecified location and other concerns. Patient was present for 5 hours.  New?Medications Started at [facility] Discharge:?? -started  Docusate Sodium 100 mg Polyethylene Glycol 3350 17 g    Medication Changes at Hospital Discharge: -Changed  none  Medications Discontinued at Hospital Discharge: -Stopped  none  Medications that remain the same after Hospital Discharge:??  -All other medications will remain the same.    Medications: Outpatient Encounter Medications as of 09/06/2022  Medication Sig Note   ALPRAZolam (XANAX) 0.5 MG tablet TAKE FOUR TABLETS BY MOUTH DAILY AS NEEDED FOR ANXIETY 08/24/2022: Took one at 10:30am   Carbidopa-Levodopa ER (SINEMET CR) 25-100 MG tablet controlled release TAKE ONE TABLET THREE TIMES DAILY    dicyclomine (BENTYL) 10 MG capsule TAKE 1 CAPSULE BY MOUTH EVERY 6 HOURS AS NEEDED (Patient not taking: Reported on 08/24/2022) 07/06/2022: prn   docusate sodium (COLACE) 100 MG capsule Take 1 capsule (100 mg total) by mouth 2 (two) times daily as needed for mild constipation. (Patient not taking: Reported on 08/24/2022) 07/06/2022: prn   FLUoxetine (PROZAC) 10 MG tablet Take 10 mg by mouth daily. (Patient not taking: Reported on 08/24/2022)    hydrOXYzine (ATARAX) 10 MG tablet Take 1 tablet (10 mg total) by mouth at bedtime. (Patient not taking: Reported on 08/24/2022) 07/06/2022: prn   loperamide (IMODIUM) 2 MG capsule Take 2 mg by mouth as needed for diarrhea or loose stools. (Patient not taking: Reported on 08/24/2022) 07/12/2022: prn   methocarbamol (ROBAXIN) 500 MG tablet One po qHS prn (Patient not taking: Reported on 08/24/2022) 08/24/2022: As needed   methylPREDNISolone (MEDROL DOSEPAK) 4 MG TBPK tablet Take 6 tablets on day 1.  Take 5 tablets on day 2.  Take 4 tablets on day 3.  Take 3 tablets on day 4.  Take 2 tablets on day 5.  Take 1 tablet on day 6. (Patient not taking: Reported on 08/24/2022) 08/24/2022: Has not started yet   omeprazole (PRILOSEC) 20  MG capsule Take 1 capsule (20 mg total) by mouth daily. Take before a meal (Patient not taking: Reported on 08/24/2022) 07/06/2022: prn   polyethylene glycol (MIRALAX) 17 g packet Take 17 g by mouth daily as needed. (Patient not taking: Reported on 08/24/2022) 07/06/2022: prn   SYNTHROID 25 MCG tablet Take 1 tablet Monday through Saturday, skipping Sundays.    No facility-administered encounter medications on file as of 09/06/2022.    Recent vitals BP Readings from Last 3 Encounters:  08/24/22 118/68  08/09/22 128/82  07/12/22 130/70   Pulse Readings from Last 3 Encounters:  08/24/22 80  08/23/22 72  08/09/22 74   Wt Readings from Last 3 Encounters:  08/24/22 89 lb 14.4 oz (40.8 kg)  08/23/22 89 lb (40.4 kg)  08/09/22 89 lb 12.8 oz (40.7 kg)   BMI Readings from Last 3 Encounters:  08/24/22 18.16 kg/m  08/23/22 17.98 kg/m  08/09/22 18.14 kg/m    Recent lab results    Component Value Date/Time   NA 144 07/12/2022 1632   K 4.3 07/12/2022 1632   CL 104 07/12/2022 1632   CO2 27 07/12/2022 1632   GLUCOSE 98 07/12/2022 1632   GLUCOSE 91 04/20/2022 1022   BUN 12 07/12/2022 1632   CREATININE 0.74 07/12/2022 1632   CREATININE 0.75 10/22/2018 1401   CREATININE 0.67  11/23/2016 0815   CALCIUM 9.7 07/12/2022 1632    Lab Results  Component Value Date   CREATININE 0.74 07/12/2022   GFR 79.36 09/23/2021   EGFR 78 07/12/2022   GFRNONAA >60 04/20/2022   GFRAA >60 03/16/2020   Lab Results  Component Value Date/Time   HGBA1C 5.3 10/07/2020 01:43 PM   HGBA1C 5.5 01/23/2018 10:46 AM    Lab Results  Component Value Date   CHOL 198 03/10/2021   HDL 66 03/10/2021   LDLCALC 112 (H) 03/10/2021   TRIG 111 03/10/2021   CHOLHDL 3.0 03/10/2021    Care Gaps: Zoster Vaccine - Overdue TDAP - Overdue COVID Booster - Overdue AWV - 11/02/21   Star Rating Drugs:  None     Ned Clines Outlook Clinical Pharmacist Assistant [phone]

## 2022-09-11 ENCOUNTER — Ambulatory Visit: Payer: Medicare Other | Admitting: Sports Medicine

## 2022-09-12 ENCOUNTER — Ambulatory Visit (INDEPENDENT_AMBULATORY_CARE_PROVIDER_SITE_OTHER): Payer: Medicare Other | Admitting: Adult Health

## 2022-09-12 ENCOUNTER — Encounter: Payer: Self-pay | Admitting: Adult Health

## 2022-09-12 DIAGNOSIS — G4700 Insomnia, unspecified: Secondary | ICD-10-CM | POA: Diagnosis not present

## 2022-09-12 DIAGNOSIS — F410 Panic disorder [episodic paroxysmal anxiety] without agoraphobia: Secondary | ICD-10-CM

## 2022-09-12 DIAGNOSIS — F331 Major depressive disorder, recurrent, moderate: Secondary | ICD-10-CM | POA: Diagnosis not present

## 2022-09-12 DIAGNOSIS — F411 Generalized anxiety disorder: Secondary | ICD-10-CM | POA: Diagnosis not present

## 2022-09-12 MED ORDER — ALPRAZOLAM 0.5 MG PO TABS: ORAL_TABLET | ORAL | 2 refills | Status: DC

## 2022-09-12 NOTE — Progress Notes (Unsigned)
Tracy Malone D.[facility] Phone: [phone]   Assessment and Plan:     There are no diagnoses linked to this encounter.  ***   Pertinent previous records reviewed include ***   Follow Up: ***     Subjective:   I, Tracy Malone, am serving as a Education administrator for Doctor Glennon Mac   Chief Complaint: left side back pain   HPI:  10/13/2021 Patient is a 89 year old female complaining of left side back pain. Patient states had surgery on back 2 years ago pcp thinks its a muscle pain got a nerve block and fell and fx her ribs a year ago, back pain has been bothering her for about 2 years from PT had a bad experience does get radiating pain down to her leg feels like a spasm cant sit for more than 30 mins laying Is awful standing isnt great either pain also radiate around to the front would like PT    12/21/2021 Patient states that she had a bad night last night but went to PT this morning Tens unit really helped , is having some sciatic pain , really enjoys aquatic therapy    01/30/2022 Patient states would like to continue with water therapy and the other , still sore from her rib pain when she fell    03/07/2022 Patient states she is doing okay, wants to talk about her neck thinks she strained it a couple of weeks ago    08/23/2022 Patient states she does not want an epidural , she want a good eval , she has low back pain  since the last time she was here , ice made the pain worse, pain radiates down her leg and to her heal , ribs are still sore from her fall last time , quit water therapy , would like a referral to water therapy if possible , isnt able to lift her legs to cross   09/13/2022 Patient states    Relevant Historical Information: History of L1 vertebral fracture, Parkinson's with essential tremor  Additional pertinent review of systems negative.   Current Outpatient Medications:     ALPRAZolam (XANAX) 0.5 MG tablet, TAKE FOUR TABLETS BY MOUTH DAILY AS NEEDED FOR ANXIETY, Disp: 120 tablet, Rfl: 0   Carbidopa-Levodopa ER (SINEMET CR) 25-100 MG tablet controlled release, TAKE ONE TABLET THREE TIMES DAILY, Disp: 270 tablet, Rfl: 3   dicyclomine (BENTYL) 10 MG capsule, TAKE 1 CAPSULE BY MOUTH EVERY 6 HOURS AS NEEDED (Patient not taking: Reported on 08/24/2022), Disp: 30 capsule, Rfl: 6   docusate sodium (COLACE) 100 MG capsule, Take 1 capsule (100 mg total) by mouth 2 (two) times daily as needed for mild constipation. (Patient not taking: Reported on 08/24/2022), Disp: 60 capsule, Rfl: 0   FLUoxetine (PROZAC) 10 MG tablet, Take 10 mg by mouth daily. (Patient not taking: Reported on 08/24/2022), Disp: , Rfl:    hydrOXYzine (ATARAX) 10 MG tablet, Take 1 tablet (10 mg total) by mouth at bedtime. (Patient not taking: Reported on 08/24/2022), Disp: 30 tablet, Rfl: 0   loperamide (IMODIUM) 2 MG capsule, Take 2 mg by mouth as needed for diarrhea or loose stools. (Patient not taking: Reported on 08/24/2022), Disp: , Rfl:    methocarbamol (ROBAXIN) 500 MG tablet, One po qHS prn (Patient not taking: Reported on 08/24/2022), Disp: 30 tablet, Rfl: 2   methylPREDNISolone (MEDROL DOSEPAK) 4 MG TBPK tablet, Take 6 tablets on day 1.  Take 5 tablets on day 2.  Take 4 tablets on day 3.  Take 3 tablets on day 4.  Take 2 tablets on day 5.  Take 1 tablet on day 6. (Patient not taking: Reported on 08/24/2022), Disp: 21 tablet, Rfl: 0   omeprazole (PRILOSEC) 20 MG capsule, Take 1 capsule (20 mg total) by mouth daily. Take before a meal (Patient not taking: Reported on 08/24/2022), Disp: 1 capsule, Rfl: 0   polyethylene glycol (MIRALAX) 17 g packet, Take 17 g by mouth daily as needed. (Patient not taking: Reported on 08/24/2022), Disp: 14 each, Rfl: 0   SYNTHROID 25 MCG tablet, Take 1 tablet Monday through Saturday, skipping Sundays., Disp: 90 tablet, Rfl: 0   Objective:     There were no vitals filed for this visit.     There is no height or weight on file to calculate BMI.    Physical Exam:    ***   Electronically signed by:  Tracy Malone D.[facility] Mal[facility] 9:23 AM 09/12/22

## 2022-09-12 NOTE — Progress Notes (Signed)
Tracy Malone [id number] Nov 07, 1931 89 y.o.  Subjective:   Patient ID:  Tracy Malone is a 89 y.o. (DOB 1932/01/09) female.  Chief Complaint: No chief complaint on file.   HPI Tracy Malone presents to the office today for follow-up of MDD, GAD, panic attacks and insomnia.  Accompanied by husband.  Describes mood today as "not good". Pleasant. Tearful throughout interview. Mood symptoms - reports depression, anxiety and irritable. Reports worry and rumination. Denies recent panic attacks. Would like to restart Prozac 21m daily. Taking Xanax as needed and feels it is helpful. Decreased interest and motivation. Taking medications as prescribed. Seeing therapist - DRinaldo [employer] levels lower. Ambulating with a cane today. Active, does not have a regular exercise routine.  Does not enjoy usual interests and activities. Married. Lives with husband - 2 grown children. Spending time with family. Appetite decreased. Not eating well - has lost weight, now at 88 pounds. Sleeping difficulties. Averages 4 to 5 hours. Reports some daytime napping - laying in the bed ost of the day. Focus and concentration stable. Completing tasks. Managing aspects of household. Retired.  Denies SI or HI.  Denies AH or VH. Denies self harm. Denies substance use.  Previous medication trials: Multiple medication trials   PHQ2-9    [place] [facility] [place] PHQ-2 Total Score 6 6 6 6 6  $ PHQ-9 Total Score 19 18 17 7 17      $ Flowsheet Row [facility] No Risk        Review  of Systems:  Review of Systems  Musculoskeletal:  Negative for gait problem.  Neurological:  Negative for tremors.  Psychiatric/Behavioral:         Please refer to HPI    Medications: I have reviewed the patient's current medications.  Current Outpatient Medications  Medication Sig Dispense Refill   ALPRAZolam (XANAX) 0.5 MG tablet TAKE FOUR TABLETS BY MOUTH DAILY AS NEEDED FOR ANXIETY 120 tablet 2   Carbidopa-Levodopa ER (SINEMET CR) 25-100 MG tablet controlled release TAKE ONE TABLET THREE TIMES DAILY 270 tablet 3   dicyclomine (BENTYL) 10 MG capsule TAKE 1 CAPSULE BY MOUTH EVERY 6 HOURS AS NEEDED (Patient not taking: Reported on 08/24/2022) 30 capsule 6   docusate sodium (COLACE) 100 MG capsule Take 1 capsule (100 mg total) by mouth 2 (two) times daily as needed for mild constipation. (Patient not taking: Reported on 08/24/2022) 60 capsule 0   FLUoxetine (PROZAC) 10 MG tablet Take 10 mg by mouth daily. (Patient not taking: Reported on 08/24/2022)     hydrOXYzine (ATARAX) 10 MG tablet Take 1 tablet (10 mg total) by mouth at bedtime. (Patient not taking: Reported on 08/24/2022) 30 tablet 0   loperamide (IMODIUM) 2 MG capsule Take 2 mg by mouth as needed for diarrhea or loose stools. (Patient not taking: Reported on 08/24/2022)     methocarbamol (ROBAXIN) 500 MG tablet One po qHS prn (Patient not taking: Reported on 08/24/2022) 30 tablet 2   methylPREDNISolone (MEDROL DOSEPAK) 4 MG TBPK tablet Take 6 tablets on day 1.  Take 5 tablets on day 2.  Take 4 tablets  on day 3.  Take 3 tablets on day 4.  Take 2 tablets on day 5.  Take 1 tablet on day 6. (Patient not taking: Reported on 08/24/2022) 21 tablet 0   omeprazole (PRILOSEC) 20 MG capsule Take 1 capsule (20 mg total) by mouth daily. Take before a meal (Patient not taking: Reported on 08/24/2022) 1 capsule 0   polyethylene glycol (MIRALAX) 17 g packet Take 17 g by mouth daily as needed. (Patient not taking: Reported on 08/24/2022) 14 each 0   SYNTHROID 25 MCG  tablet Take 1 tablet Monday through Saturday, skipping Sundays. 90 tablet 0   No current facility-administered medications for this visit.    Medication Side Effects: None  Allergies:  Allergies  Allergen Reactions   Iodinated Contrast Media Anaphylaxis   Iodine Anaphylaxis    IV and topical forms. Other reaction(s): Unknown   Levsin [Hyoscyamine Sulfate]     Vision problems/pt has glaucoma   Salmon [Fish Allergy] Hives and Shortness Of Breath   Shellfish Allergy Anaphylaxis   Tramadol Swelling   Remeron [Mirtazapine] Other (See Comments)    Cause blurred vision and red eyes, pt has glaucoma   Aspirin Other (See Comments)    Sever stomach pain due to ulcer scaring.   Bis Subcit-Metronid-Tetracyc Swelling    Tongue swelling. Face tingling Other reaction(s): Unknown   Cephalexin Hives    Other reaction(s): hives   Ciprofloxacin Diarrhea   Codeine Nausea And Vomiting   Cyclobenzaprine Other (See Comments)    Tingly/prickly sensation. Other reaction(s): tingly/prickly sensation   Darvocet [Propoxyphene N-Acetaminophen] Nausea And Vomiting   Demerol [Meperidine] Nausea Only   Dexlansoprazole Swelling    Redness, swelling and peeling of both feet. Other reaction(s): foot pain   Diphedryl [Diphenhydramine] Other (See Comments)    Increased pulse/small amount ok   Doxycycline Hyclate Other (See Comments)    GI intolerance.   Doxycycline Hyclate     Other reaction(s): GI intolerance   Epinephrine Other (See Comments)    Breathing problems Other reaction(s): breathing problems/fainting   Erythromycin Other (See Comments)    GI intolerance. Other reaction(s): GI   Fish Oil     Other reaction(s): breathing problems/hives   Hyoscyamine     Other reaction(s): eye pain   Latex Other (See Comments)    Gloves ok.  Skin gets red from elastic in underwear and latex bandaides.   Meperidine Hcl     Other reaction(s): vomiting   Nitrofurantoin Diarrhea   Other     Other  reaction(s): migraines Other reaction(s): Unknown Other reaction(s): Unknown Other reaction(s): Unknown Other reaction(s): increased pulse, faint, diarrhea   Prednisone Other (See Comments)    Headache Other reaction(s): headache   Ra Diphedryl Allergy [Diphenhydramine Hcl]     Other reaction(s): increased pulse small dose okay   Sertraline Hcl Swelling and Other (See Comments)    Migraine Swelling of tongue/lip (09/2012) Other reaction(s): Unknown   Shellfish-Derived Products     Other reaction(s): Unknown   Sulfa Antibiotics Other (See Comments)    Increased pulse, fainting, diarrhea, thrush   Wellbutrin [Bupropion] Other (See Comments)    Headaches   Xylocaine [Lidocaine Hcl]     With epinephrine, given by dentist.  Speeded up heart rate and she passed out (occured twice, at dentist)   Xylocaine [Lidocaine]     Other reaction(s): Unknown   Biaxin [Clarithromycin] Rash    Started after completing 10 day course of 2000 mg /day, Lips swelling   Ibuprofen Other (  See Comments)    Motrin ok with a GI effect. Other reaction(s): rash Motrin okay with a GI effect    Past Medical History:  Diagnosis Date   Bell's palsy 1966   Hx: right side facial droop, resolved per patient 04/02/19   Carotid artery disease ([place]) 2010   on vascular screening;unchanged 2013.(could not tolerate simvastatin, no other statins tried)--<30% blockage bilat 07/2011   Chronic abdominal pain    Chronic fatigue and malaise    Claustrophobia    Cyst of left ovary    last imaging 06/2021, benign, no further f/u   Depression    treated in the past for years;stopped in 2010 for a years   Duodenal ulcer 1962   h/o   Dysrhythmia    ocassional PVC's, no current problems per patient on 04/02/19   Fibromyalgia    Frequent PVCs 07/2012   Seen by [facility]: benign, asymptomatic, normal EF   Gallstones 02/2021   on [place]   GERD (gastroesophageal reflux disease)    diet controlled   Glaucoma, narrow-angle    s/p  laser surgery   History of hiatal hernia    during endoscopy   Hypothyroid 03/2007   IBS (irritable bowel syndrome)    Dr. Benson [place]   Ischemic colitis ([place]) 11/21/2018   no current problems per patient on [id number]   Lichenoid keratosis [id number]   Dr.Stinehelfer   Nausea and vomiting 10/29/2019   Ocular migraine    Osteoporosis 04/2010   Dr.Hawkes; later consulted Dr. Buddy Duty (2022)   Panic attack    Parkinson disease    Parkinson's disease 06/23/2016   Recurrent UTI    has cystocele-Dr.Grewal   Shingles 1999   h/o   Superficial thrombophlebitis 03/2009   RLE   Trochanteric bursitis 12/2008   bilateral    Past Medical History, Surgical history, Social history, and Family history were reviewed and updated as appropriate.   Please see review of systems for further details on the patient's review from today.   Objective:   Physical Exam:  LMP  (LMP Unknown)   Physical Exam Constitutional:      General: She is not in acute distress. Musculoskeletal:        General: No deformity.  Neurological:     Mental Status: She is alert and oriented to person, place, and time.     Coordination: Coordination normal.  Psychiatric:        Attention and Perception: Attention and perception normal. She does not perceive auditory or visual hallucinations.        Mood and Affect: Mood normal. Mood is not anxious or depressed. Affect is not labile, blunt, angry or inappropriate.        Speech: Speech normal.        Behavior: Behavior normal.        Thought Content: Thought content normal. Thought content is not paranoid or delusional. Thought content does not include homicidal or suicidal ideation. Thought content does not include homicidal or suicidal plan.        Cognition and Memory: Cognition and memory normal.        Judgment: Judgment normal.     Comments: Insight intact     Lab Review:     Component Value Date/Time   NA 144 07/12/2022 1632   K 4.3 07/12/2022 1632   CL 104  07/12/2022 1632   CO2 27 07/12/2022 1632   GLUCOSE 98 07/12/2022 1632   GLUCOSE 91 04/20/2022 1022   BUN  12 07/12/2022 1632   CREATININE 0.74 07/12/2022 1632   CREATININE 0.75 10/22/2018 1401   CREATININE 0.67 11/23/2016 0815   CALCIUM 9.7 07/12/2022 1632   PROT 7.0 07/12/2022 1632   ALBUMIN 4.3 07/12/2022 1632   AST 23 07/12/2022 1632   AST 17 10/22/2018 1401   ALT 6 07/12/2022 1632   ALT 7 10/22/2018 1401   ALKPHOS 70 07/12/2022 1632   BILITOT 1.2 07/12/2022 1632   BILITOT 0.8 10/22/2018 1401   GFRNONAA >60 04/20/2022 1022   GFRNONAA >60 10/22/2018 1401   GFRAA >60 03/16/2020 1153   GFRAA >60 10/22/2018 1401       Component Value Date/Time   WBC 7.9 07/12/2022 1632   WBC 7.7 04/20/2022 1022   RBC 4.62 07/12/2022 1632   RBC 5.06 04/20/2022 1022   HGB 13.7 07/12/2022 1632   HCT 42.5 07/12/2022 1632   PLT 285 07/12/2022 1632   MCV 92 07/12/2022 1632   MCH 29.7 07/12/2022 1632   MCH 30.2 04/20/2022 1022   MCHC 32.2 07/12/2022 1632   MCHC 32.3 04/20/2022 1022   RDW 12.0 07/12/2022 1632   LYMPHSABS 2.0 07/12/2022 1632   MONOABS 0.6 04/20/2022 1022   EOSABS 0.3 07/12/2022 1632   BASOSABS 0.1 07/12/2022 1632    No results found for: "POCLITH", "LITHIUM"   No results found for: "PHENYTOIN", "PHENOBARB", "VALPROATE", "CBMZ"   .res Assessment: Plan:   Plan:  PDMP reviewed  Xanax 0.43m - 2 x daily prn anxiety - usually undertakes  Restart Prozac 167mdaily - has medication - husband feels like she needs to give the medication a chance to work instead of starting and stopping.  Home health - M-F coming to help out - 4 hours a day.   RTC 3 months  Patient advised to contact office with any questions, adverse effects, or acute worsening in signs and symptoms.  Discussed potential benefits, risk, and side effects of benzodiazepines to include potential risk of tolerance and dependence, as well as possible drowsiness. Advised patient not to drive if experiencing  drowsiness and to take lowest possible effective dose to minimize risk of dependence and tolerance.  Diagnoses and all orders for this visit:  Major depressive disorder, recurrent episode, moderate (HCC)  Generalized anxiety disorder -     ALPRAZolam (XANAX) 0.5 MG tablet; TAKE FOUR TABLETS BY MOUTH DAILY AS NEEDED FOR ANXIETY  Panic attacks  Insomnia, unspecified type     Please see After Visit Summary for patient specific instructions.  Future Appointments  Date Time Provider DeParmelee2/21/2024  2:30 PM JaGlennon MacDO [facility] None  09/19/2022  4:00 PM DoShanon AceLCSW [facility] None  10/05/2022  3:00 PM DoShanon AceLCSW [facility] None  10/19/2022  3:00 PM DoShanon AceLCSW [facility] None  11/02/2022  3:00 PM DoShanon AceLCSW [facility] None  11/08/2022  2:45 PM Sater, RiNanine MeansMD [facility] None    No orders of the defined types were placed in this encounter.   -------------------------------

## 2022-09-13 ENCOUNTER — Ambulatory Visit (INDEPENDENT_AMBULATORY_CARE_PROVIDER_SITE_OTHER): Payer: Medicare Other | Admitting: Sports Medicine

## 2022-09-13 VITALS — BP 108/78 | Ht 59.0 in | Wt 90.0 lb

## 2022-09-13 DIAGNOSIS — M5136 Other intervertebral disc degeneration, lumbar region: Secondary | ICD-10-CM

## 2022-09-13 DIAGNOSIS — R29898 Other symptoms and signs involving the musculoskeletal system: Secondary | ICD-10-CM | POA: Diagnosis not present

## 2022-09-13 DIAGNOSIS — G8929 Other chronic pain: Secondary | ICD-10-CM

## 2022-09-13 DIAGNOSIS — M5442 Lumbago with sciatica, left side: Secondary | ICD-10-CM

## 2022-09-13 DIAGNOSIS — M51369 Other intervertebral disc degeneration, lumbar region without mention of lumbar back pain or lower extremity pain: Secondary | ICD-10-CM

## 2022-09-13 NOTE — Patient Instructions (Addendum)
Good to see you Epidural left L3-L4 Follow up 2 weeks after to discuss results

## 2022-09-14 ENCOUNTER — Telehealth: Payer: Self-pay | Admitting: Adult Health

## 2022-09-14 ENCOUNTER — Inpatient Hospital Stay: Admission: RE | Admit: 2022-09-14 | Payer: Medicare Other | Source: Ambulatory Visit

## 2022-09-14 NOTE — Telephone Encounter (Signed)
Tracy Malone called at 11:23 needing clarification on restarting the Prozac.  Please call with instructions and starting and then if shew is to increase after a certain time.

## 2022-09-14 NOTE — Telephone Encounter (Signed)
Patient notified of instructions from visit yesterday.

## 2022-09-15 ENCOUNTER — Telehealth: Payer: Self-pay | Admitting: Sports Medicine

## 2022-09-15 NOTE — Telephone Encounter (Signed)
Patient called asking if it would be okay for her to take Carisoprodol? She said that she knows she can not handle a whole pill but thinking half may help her to relax and not have as much pain?  Please advise.

## 2022-09-15 NOTE — Telephone Encounter (Signed)
[facility] called pt to schedule MRI. Pt cannot have contrast due to a bad allergy to iodine ( this is per patient ).  Elmo Putt at [facility] [phone] ) said they could [place] Godolinium instead, but pt wanted to be sure this would be safe for her.

## 2022-09-18 ENCOUNTER — Telehealth: Payer: Self-pay | Admitting: Adult Health

## 2022-09-18 ENCOUNTER — Other Ambulatory Visit: Payer: Self-pay | Admitting: Family Medicine

## 2022-09-18 DIAGNOSIS — E039 Hypothyroidism, unspecified: Secondary | ICD-10-CM

## 2022-09-18 NOTE — Telephone Encounter (Signed)
That's is a muscle relaxer - I do not prescribe that. She can request from her PCP.

## 2022-09-18 NOTE — Telephone Encounter (Signed)
Patient is asking for something for sleep. She said she can get to sleep with the Xanax but can't stay asleep. She is asking for [employer]. She said she didn't mention it at her last visit because she wasn't feeling that bad. She said she has not been on Soma previously.

## 2022-09-18 NOTE — Telephone Encounter (Signed)
Tracy Malone called at 1:55 wanting to know if she can take something at night to help with sleep.She has a particular medication or supplement in mind.  The Xanax is just helping.  She is not sleeping and is just all tied up in knots.  She doesn't relax and wakes up just exhausted. Please call to discuss.

## 2022-09-18 NOTE — Telephone Encounter (Signed)
Called pt, informed of Dr. Marisue Brooklyn response and to ctc [facility] to schedule MRI with alternate contrast.

## 2022-09-19 ENCOUNTER — Ambulatory Visit (INDEPENDENT_AMBULATORY_CARE_PROVIDER_SITE_OTHER): Payer: Medicare Other | Admitting: Psychiatry

## 2022-09-19 DIAGNOSIS — F411 Generalized anxiety disorder: Secondary | ICD-10-CM

## 2022-09-19 NOTE — Telephone Encounter (Signed)
This message was referencing the patient trying Carisoprodol (a muscle relaxer) to help with the pain.  Please advise.

## 2022-09-19 NOTE — Telephone Encounter (Signed)
Patient notified

## 2022-09-19 NOTE — Telephone Encounter (Signed)
Called patient to follow up about injection and she notified me that she is refusing at this time,because she does not want any kind of dye in her body period. She states that she is going to go somewhere else for a second opinion

## 2022-09-19 NOTE — Progress Notes (Signed)
[facility] Counselor/Therapist Progress Note  Patient ID: Tracy Malone, MRN: [id number],    Date: 09/19/2022  Time Spent: 48 minutes   Virtual Visit via Telehealth Note: Telephone only as patient not able to do the video Connected with patient by a telemedicine/telehealth application, with their informed consent, and verified patient privacy and that I am speaking with the correct person using two identifiers. I discussed the limitations, risks, security and privacy concerns of performing psychotherapy and the availability of in person appointments. I also discussed with the patient that there may be a patient responsible charge related to this service. The patient expressed understanding and agreed to proceed. I discussed the treatment planning with the patient. The patient was provided an opportunity to ask questions and all were answered. The patient agreed with the plan and demonstrated an understanding of the instructions. The patient was advised to call  our office if  symptoms worsen or feel they are in a crisis state and need immediate contact.   Therapist Location: office Patient Location: home   Treatment Type: Individual Therapy  Reported Symptoms: anxiety, some depression, having back pain   Mental Status Exam:  Appearance:   N/A      Behavior:  Appropriate  Motor:  Uses a cane when walking  Speech/Language:   Clear and Coherent  Affect:  N/a  Mood:  anxious and depressed  Thought process:  goal directed  Thought content:    WNL and Rumination  Sensory/Perceptual disturbances:    WNL  Orientation:  oriented to person, place, time/date, situation, day of week, month of year, year, and stated date of Feb. 27, 2024  Attention:  Fair  Concentration:  Fair  Memory:  [place] of knowledge:   Good  Insight:    Good and Fair  Judgment:   Good  Impulse Control:  Good and Fair   Risk Assessment: Danger to Self:  No Self-injurious Behavior: No Danger to Others:  No Duty to Warn:no Physical Aggression / Violence:No  Access to Firearms a concern: No  Gang Involvement:No   Subjective:   Patient today reporting anxiety, depression, mostly related to some things in past, but mostly due to current health challenges. Is to see Dr tomorrow about her back pain. Feels that it is worsening due to her aging. May attend class at gym on Parkinson's. Also considering more water therapy at the gym but hasn't been lately . Likes the lady who comes to help patient and husband from [employer]. Reports sleep is usually better but not last night due to her back pain. Reports she has been working on assignment from last session re: letting go of old hurts in the past. Sadness, she reports is a little better. Better communication but "then it gets worse again because he can't hear me."  Problem-solved this with patient including getting in the same room before they start talking to each other.  Less fearful. Some irritability "and I'm working on it."   Interventions: Cognitive Behavioral Therapy, Solution-Oriented/Positive Psychology, and Ego-Supportive  Long term goal: Develop the ability to recognize, accept, and cope with feelings of anxiety and depression. Short term goal: Verbalize any unresolved grief issues that may be contributing to anxiety and depression. Strategy: Replace negative self-defeating self talk with verbalization of realistic and positive cognitive messages to lessen depression/anxiety and improve mood.  Diagnosis:   ICD-10-CM   1. Generalized anxiety disorder  F41.1      Plan:   Patient  today participating well working on her depression, anxiety, irritability, a lot of which is related to the past but also due to current health challenges and challenges at home.  Encouraged that she is to see a doctor tomorrow about her back.  As noted above, she is getting involved in a new class for Parkinson's patients at the gym and plans to also consider the  water therapy there.  Is definitely making progress even though it is up-and-down at times, and needs to continue with goal-directed behaviors in order to keep moving forward in a positive direction. Encouraged patient in her practice of more positive/healing/and self affirming thoughts and behaviors including: Remaining on her medication as prescribed, following through on any of her doctors suggestions, recognizing the strength she shows in getting through some tough times already, refrain from assuming worst-case scenarios, refer to the list that we worked with previously that patient says helps her to "get centered" whenever she is anxious and stressed, use list we also worked on as a reminder for the positives and not to focus only on the negatives, participate in activities that she enjoys and is able to do physically which she has admitted helps her feel better when she does, continue phone calls with friends, listening to music that she loves, positive self talk and self-care, reading, occasionally getting out of the house as she is able, and recognize the strength she shows working with goal-directed behaviors to move in a direction that supports her improved emotional health and overall wellbeing.  Goal review and progress/challenges noted with patient.  Next appointment within 3 weeks.  This record has been created using [employer].  Chart creation errors have been sought, but may not always have been located and corrected.  Such creation errors do not reflect on the standard of medical care provided.   Shanon Ace, LCSW

## 2022-09-20 ENCOUNTER — Ambulatory Visit: Payer: Medicare Other | Admitting: Podiatry

## 2022-09-21 ENCOUNTER — Ambulatory Visit: Payer: Medicare Other | Admitting: Podiatry

## 2022-09-21 DIAGNOSIS — S32010S Wedge compression fracture of first lumbar vertebra, sequela: Secondary | ICD-10-CM | POA: Diagnosis not present

## 2022-09-21 DIAGNOSIS — M47816 Spondylosis without myelopathy or radiculopathy, lumbar region: Secondary | ICD-10-CM | POA: Diagnosis not present

## 2022-09-26 ENCOUNTER — Ambulatory Visit (INDEPENDENT_AMBULATORY_CARE_PROVIDER_SITE_OTHER): Payer: Medicare Other | Admitting: Podiatry

## 2022-09-26 DIAGNOSIS — G6289 Other specified polyneuropathies: Secondary | ICD-10-CM | POA: Diagnosis not present

## 2022-09-26 DIAGNOSIS — M79675 Pain in left toe(s): Secondary | ICD-10-CM | POA: Diagnosis not present

## 2022-09-26 DIAGNOSIS — M79674 Pain in right toe(s): Secondary | ICD-10-CM

## 2022-09-26 DIAGNOSIS — B351 Tinea unguium: Secondary | ICD-10-CM | POA: Diagnosis not present

## 2022-09-27 ENCOUNTER — Encounter: Payer: Self-pay | Admitting: Podiatry

## 2022-09-27 NOTE — Progress Notes (Signed)
  Subjective:  Patient ID: Tracy Malone, female    DOB: Aug 09, 1931,  MRN: [id number]  Chief Complaint  Patient presents with   Nail Problem    Thick painful toenails   Foot Pain    (np) left foot joint pain   Callouses    5th submet - very painful    89 y.o. female presents with the above complaint. History confirmed with patient.  She has burning aching pain in the feet in the front of the ankles, this is worse in the morning and sometimes at night, her nails are thickened elongated causing pain and discomfort.  The pains developed after 2020 when she had back surgery.  Objective:  Physical Exam: warm, good capillary refill, no trophic changes or ulcerative lesions, normal DP and PT pulses, and abnormal sensory exam. Left Foot: dystrophic yellowed discolored nail plates with subungual debris Right Foot: dystrophic yellowed discolored nail plates with subungual debris   Assessment:   1. Other polyneuropathy   2. Pain due to onychomycosis of toenails of both feet      Plan:  Patient was evaluated and treated and all questions answered.   We discussed etiology of polyneuropathy and how this relates to age-related changes as well as the possibility of spinal involvement with her history of back surgery.  She is an upcoming appointment with her back surgeon she will discuss with him.  Discussed the etiology and treatment options for the condition in detail with the patient. Educated patient on the topical and oral treatment options for mycotic nails. Recommended debridement of the nails today. Sharp and mechanical debridement performed of all painful and mycotic nails today. Nails debrided in length and thickness using a nail nipper to level of comfort. Discussed treatment options including appropriate shoe gear. Follow up as needed for painful nails.    Return if symptoms worsen or fail to improve.

## 2022-09-28 ENCOUNTER — Telehealth: Payer: Self-pay | Admitting: *Deleted

## 2022-09-28 NOTE — Telephone Encounter (Signed)
Patient advised.

## 2022-09-28 NOTE — Telephone Encounter (Signed)
Patient called to let you know she has been upset lately and not sleeping well. Wants to know if you think it would be okay for her to take half of a half of carisoprodol to try to help her get some rest and calm her down.

## 2022-09-28 NOTE — Telephone Encounter (Signed)
I do not recommend using a muscle relaxant to help her relax and sleep. Sounds like this is more related to anxiety/moods. This shouldn't be mixed with her alprazolam (which would help her relax more). Deep breathing and relaxation techniques (not sure if her therapist has recommended any apps or meditations) each evening may help.

## 2022-10-02 DIAGNOSIS — M47816 Spondylosis without myelopathy or radiculopathy, lumbar region: Secondary | ICD-10-CM | POA: Diagnosis not present

## 2022-10-05 ENCOUNTER — Ambulatory Visit: Payer: Medicare Other | Admitting: Psychiatry

## 2022-10-11 ENCOUNTER — Telehealth: Payer: Self-pay | Admitting: Adult Health

## 2022-10-11 ENCOUNTER — Telehealth: Payer: Self-pay | Admitting: *Deleted

## 2022-10-11 ENCOUNTER — Other Ambulatory Visit: Payer: Self-pay

## 2022-10-11 DIAGNOSIS — F411 Generalized anxiety disorder: Secondary | ICD-10-CM

## 2022-10-11 MED ORDER — ALPRAZOLAM 0.5 MG PO TABS: ORAL_TABLET | ORAL | 2 refills | Status: DC

## 2022-10-11 NOTE — Telephone Encounter (Signed)
Tracy Malone called stating he is waiting on you to okay a visit with [facility] for [name]. I cannot find anything that they have sent to [place] requesting anything for her. I do see that he was referred by his pulm and they come out and do visits for him. He said that they offered to see her as well and need you to approve and it is holding up having them see her. I am assuming he is asking for a referral for her as well.

## 2022-10-11 NOTE — Telephone Encounter (Signed)
Ok to resume what she had before, ok for another order. Failure to thrive, depression, chronic pain, fibromyalgia

## 2022-10-11 NOTE — Telephone Encounter (Signed)
Noted. Ty.

## 2022-10-11 NOTE — Telephone Encounter (Signed)
Does she need it right now or is this for future reference?

## 2022-10-11 NOTE — Telephone Encounter (Signed)
So it looks like [facility] was who used to come out and see her. Do you want to put in a new referral? Or do you want her to come in for a visit to discuss? Mr. Shake said he called over there today and they wanted a referral.

## 2022-10-11 NOTE — Telephone Encounter (Signed)
I know nothing about this.  Not sure which doctor could have mentioned or suggested it to her. I haven't heard anything about this. I would think a referral for hospice (or palliative care) should have some sort of discussion.  Did they discuss this with anyone through [facility]? I don't see any documention of this. Of course she sees lots of non-[facility] providers, so not sure who suggested this to her, or know what kind of discussion they may have had.  Maybe we SHOULD have the care coordinator from [facility] touch base.   (They used to be followed by palliative care, but apparently that was stopped).

## 2022-10-11 NOTE — Telephone Encounter (Signed)
Tyia called asking for her xanax to be sent to the [facility] at [address]. [facility]

## 2022-10-11 NOTE — Telephone Encounter (Signed)
Rx sent to [facility] and canceled at [facility].

## 2022-10-12 ENCOUNTER — Other Ambulatory Visit: Payer: Self-pay | Admitting: *Deleted

## 2022-10-12 ENCOUNTER — Telehealth: Payer: Self-pay

## 2022-10-12 DIAGNOSIS — R627 Adult failure to thrive: Secondary | ICD-10-CM

## 2022-10-12 NOTE — Telephone Encounter (Signed)
(  2:49 pm) PC SW scheduled an initial visit for patient with her husband. Initial palliative care visit is scheduled for 10/13/22 @ 11am.

## 2022-10-12 NOTE — Telephone Encounter (Signed)
Order placed and patient advised.  

## 2022-10-13 ENCOUNTER — Other Ambulatory Visit: Payer: Medicare Other

## 2022-10-13 VITALS — BP 100/50 | HR 66 | Resp 18

## 2022-10-13 DIAGNOSIS — Z515 Encounter for palliative care: Secondary | ICD-10-CM

## 2022-10-15 NOTE — Progress Notes (Unsigned)
No chief complaint on file.  I was sent a staff message on 3/22 from [place], a Palliative Care nurse with [facility] who went out to see Tracy Malone and she reported that she has felt weak, dizzy, and overall bad for a couple of weeks now. Blood pressure was 96/50 in the right arm and 100/52 in left arm. She had said this was very low and asked PJ to contact our office with this info, and requested to come in for blood work.  She presents to discuss these concerns.  She had sent a message 3/7 stating she was upset and not sleeping well. (Asking if she could take carisoprodol, which was not recommended). I had advised that was a muscle relaxant, could be sedating, but not to be mixed with her other sedating meds (xanax), and not just for sleep. Encouraged her to use relaxation techniques or other tools she has likely learned from her therapist.  In reviewing chart today, I see that she had asked her psychiatrist to prescribe her Soma on 2/26, she declined and deferred to PCP. Last visit was therapist was 2/27, scheduled for 3/28.  BP Readings from Last 3 Encounters:  09/13/22 108/78  08/24/22 118/68  08/09/22 128/82     PMH, PSH, SH reviewed    ROS:   PHYSICAL EXAM:  LMP  (LMP Unknown)   Wt Readings from Last 3 Encounters:  09/13/22 90 lb (40.8 kg)  08/24/22 89 lb 14.4 oz (40.8 kg)  08/23/22 89 lb (40.4 kg)

## 2022-10-16 ENCOUNTER — Encounter: Payer: Self-pay | Admitting: Family Medicine

## 2022-10-16 ENCOUNTER — Ambulatory Visit (INDEPENDENT_AMBULATORY_CARE_PROVIDER_SITE_OTHER): Payer: Medicare Other | Admitting: Family Medicine

## 2022-10-16 VITALS — BP 130/70 | HR 72 | Ht 59.0 in | Wt 86.8 lb

## 2022-10-16 DIAGNOSIS — K649 Unspecified hemorrhoids: Secondary | ICD-10-CM | POA: Diagnosis not present

## 2022-10-16 DIAGNOSIS — E039 Hypothyroidism, unspecified: Secondary | ICD-10-CM

## 2022-10-16 DIAGNOSIS — E46 Unspecified protein-calorie malnutrition: Secondary | ICD-10-CM

## 2022-10-16 DIAGNOSIS — F339 Major depressive disorder, recurrent, unspecified: Secondary | ICD-10-CM

## 2022-10-16 DIAGNOSIS — R5383 Other fatigue: Secondary | ICD-10-CM | POA: Diagnosis not present

## 2022-10-16 NOTE — Patient Instructions (Signed)
[employer] makes a lactose-free protein drink. I like the chocolate one. You can ask the palliative care folks for other suggestions.  This only has 150 calories, and you need more supplemental caloric intake than just this one extra shake. Please have small frequent snacks during the day, and continue your healthy meals. You need to eat in order to have energy.  We are checking blood counts and thyroid today.  Be sure to follow up with your therapist as planned. I agree with getting the next injection in your back--getting your pain controlled will help you sleep better and help with your moods.

## 2022-10-17 LAB — CBC WITH DIFFERENTIAL/PLATELET
Basophils Absolute: 0.1 10*3/uL (ref 0.0–0.2)
Basos: 1 %
EOS (ABSOLUTE): 0.2 10*3/uL (ref 0.0–0.4)
Eos: 2 %
Hematocrit: 41.7 % (ref 34.0–46.6)
Hemoglobin: 14.2 g/dL (ref 11.1–15.9)
Immature Grans (Abs): 0 10*3/uL (ref 0.0–0.1)
Immature Granulocytes: 0 %
Lymphocytes Absolute: 1.7 10*3/uL (ref 0.7–3.1)
Lymphs: 20 %
MCH: 31 pg (ref 26.6–33.0)
MCHC: 34.1 g/dL (ref 31.5–35.7)
MCV: 91 fL (ref 79–97)
Monocytes Absolute: 0.6 10*3/uL (ref 0.1–0.9)
Monocytes: 7 %
Neutrophils Absolute: 6 10*3/uL (ref 1.4–7.0)
Neutrophils: 70 %
Platelets: 255 10*3/uL (ref 150–450)
RBC: 4.58 x10E6/uL (ref 3.77–5.28)
RDW: 12.3 % (ref 11.7–15.4)
WBC: 8.7 10*3/uL (ref 3.4–10.8)

## 2022-10-17 LAB — TSH: TSH: 1.13 u[IU]/mL (ref 0.450–4.500)

## 2022-10-18 ENCOUNTER — Encounter: Payer: Self-pay | Admitting: Family Medicine

## 2022-10-19 ENCOUNTER — Ambulatory Visit (INDEPENDENT_AMBULATORY_CARE_PROVIDER_SITE_OTHER): Payer: Medicare Other | Admitting: Psychiatry

## 2022-10-19 DIAGNOSIS — F331 Major depressive disorder, recurrent, moderate: Secondary | ICD-10-CM | POA: Diagnosis not present

## 2022-10-19 NOTE — Progress Notes (Signed)
[facility] Counselor/Therapist Progress Note  Patient ID: Tracy Malone, MRN: [id number],    Date: 10/19/2022  Time Spent: 48 minutes  Treatment Type: Individual Therapy  Virtual Visit via Telehealth Note: Telephone only as patient not able to do the video Connected with patient by a telemedicine/telehealth application, with their informed consent, and verified patient privacy and that I am speaking with the correct person using two identifiers. I discussed the limitations, risks, security and privacy concerns of performing psychotherapy and the availability of in person appointments. I also discussed with the patient that there may be a patient responsible charge related to this service. The patient expressed understanding and agreed to proceed. I discussed the treatment planning with the patient. The patient was provided an opportunity to ask questions and all were answered. The patient agreed with the plan and demonstrated an understanding of the instructions. The patient was advised to call  our office if  symptoms worsen or feel they are in a crisis state and need immediate contact.   Therapist Location: office Patient Location: home   Reported Symptoms: depression, anxiety  Mental Status Exam:  Appearance:   N/a   telehealth      Behavior:  Sharing  Motor:  Uses rollater to walk  Speech/Language:   Clear and Coherent  Affect:  N/a  telehealth  Mood:  anxious and depressed  Thought process:  goal directed  Thought content:    Rumination  Sensory/Perceptual disturbances:    WNL  Orientation:  oriented to person, place, time/date, situation, day of week, month of year, year, and stated date of October 19, 2022  Attention:  Fair  Concentration:  Good and Fair  Memory:  Emery of knowledge:   Good  Insight:    Good and Fair  Judgment:   Good  Impulse Control:  Good   Risk Assessment: Danger to Self:  No Self-injurious Behavior: No Danger to Others: No Duty to  Warn:no Physical Aggression / Violence:No  Access to Firearms a concern: No  Gang Involvement:No   Subjective:  Patient today reporting depression, anxiety, and having some physical pain issues in her back. Still "longing for her adult son to come see her and husband, but he is not able to do so currently. (Not all details included in this note due to patient privacy needs. )  Patient did well today and talking through her depression and anxiety however is finding it very hard to let go of the negatives and see anything positive.  She denies any SI and does not allude to any thoughts of harming self or anybody else.  She does have a fairly long history of getting stuck on the negatives per family report previously.  Tried to work with her on that today and she did respond some, but has a hard time maintaining.  Was a little more upbeat and talking about some of the hospice services that they are getting and was quick to say "not for people who are dying" but for people who have health issues and just want some extra help.  I think she and husband will find that help very supportive.  As they are both in their upper 80s and have health issues.  Reports her back pain has affected her sleep some but "it is not horrible".  Really likes her care worker that comes from [place] at least 4 times a week.    Interventions: Cognitive Behavioral Therapy and Ego-Supportive  Long  term goal: Develop the ability to recognize, accept, and cope with feelings of anxiety and depression. Short term goal: Verbalize any unresolved grief issues that may be contributing to anxiety and depression. Strategy: Replace negative self-defeating self talk with verbalization of realistic and positive cognitive messages to lessen depression/anxiety and improve mood.  Diagnosis:   ICD-10-CM   1. Major depressive disorder, recurrent episode, moderate (HCC)  F33.1      Plan:  Patient today participating in session and  focusing on her depression and anxiety partly due to physical back pain and her history of multiple health problems, along with currently still not being able to see her son who is out of state.  Patient is working to make progress and needs to continue working with goal-directed behaviors to move in a more positive direction. Encouraged patient and practicing more positive/self affirming/healing behaviors including: Staying on her medication as prescribed, recognizing the strength that she shows in getting through tough times already, refrain from assuming worst-case scenarios, refer to the list that we worked with previously that patient says helps her to "get centered" whenever she is anxious and stressed, use the other list that we worked on as a reminder for the positives and not to focus only on the negatives, participate in activities that she enjoys and is able to do physically which she has admitted helps her feel better when she does, continue phone calls with friends, listening to music that she loves, positive self talk and self-care, reading, occasionally getting out of the house as she is able, and recognize the strength she shows when working with goal-directed behaviors to move in a direction that supports her improved emotional health and outlook.  Goal review and progress/challenges noted with patient.  Next appointment within 3 weeks.  This record has been created using [employer].  Chart creation errors have been sought, but may not always have been located and corrected.  Such creation errors do not reflect on the standard of medical care provided.   Shanon Ace, LCSW

## 2022-10-19 NOTE — Progress Notes (Signed)
[facility] Initial Encounter Note   PATIENT NAME: Tracy Malone DOB: 07-29-1931 MRN: [id number]  PRIMARY CARE PROVIDER: [name], MD  RESPONSIBLE PARTY:  Acct ID - Guarantor Home Phone Work Phone Relationship Acct Type  [id number] DOSHIE, [phone]  Self P/F     [address], [place], [place] [address]   RN completed home visit. Husband also present    HISTORY OF PRESENT ILLNESS:  89 y.o. year old female with Parkinson's Disease, osteopenia, lumbar compression fracture, aortic atherosclerosis, GERD, IBS, functional dyspepsia, hx of colitis and ischemic colitis, chronic lymphocytic thyroiditis, cystocele, early satiety, depression with anxiety, low back pain with sciatica, bilateral leg pain and hx of trochanteric bursitis.   Socially: lives in town home with husband. Has paid caregiver that comes in 5 days a week in the afternoon.   Cognitive: Alert and oriented x 3. Engages appropriately in conversations.    Appetite: Pt reports never being hungry. Questions whether it is from pain. Pt reports that she is down to 90 pounds, down from 109 pounds.    Mobility: Ambulates slowly. Dragging left foot some. Uses cane always when she leaves the home and sometimes inside as well. Denies any falls. Last fall was 2 years ago   Sleeping Pattern: Reports that she can't sleep well ever. Pain is one of the worst reasons. Pt reports sleeping an average of 5 hours a night and typically naps for an hour to hour and half a day.   Pain: C/O lower back pain. Pt had injections in neurosurgeon's office. Dr. Trenton Gammon. Did help some pain went from 10 down to 7 only. Currently is a 5 on 1-10 scale. Pt noted to be dragging left foot some when ambulating. Pt takes Tylenol for pain.   Palliative Care/ Hospice: RN explained role and purpose of palliative care including visit frequency. Also discussed benefits of hospice care as well as the differences between the two with patient.   Goals of  Care: Stay at home with my husband as long as possible. Does not want to go to hospitals if at all possible.     CODE STATUS: DNI per MOST form ADVANCED DIRECTIVES: N MOST FORM: Yes (found in Epic) PPS:   Next appt scheduled: April 18th at [facility]:   VITALS: Today's Vitals   10/13/22 1145  BP: (!) 100/50  Pulse: 66  Resp: 18  SpO2: 96%  PainSc: 5   PainLoc: Back    Pt is very concerned about how low her blood pressure is at today's visit. States, "my blood pressure is never that low. No wonder I feel so bad." Pt requesting that RN notify PCP office. RN will reach out to let pcp know via Epic.  LUNGS: clear to auscultate bilat, no cough CARDIAC:  regular, no JVD EXTREMITIES: slight tremor noted in right hand. No edema  Jacqulyn Cane, RN

## 2022-10-19 NOTE — Progress Notes (Deleted)
      [facility] Counselor/Therapist Progress Note  Patient ID: PHYILLIS KIRST, MRN: [id number],    Date: 10/19/2022  Time Spent: ***   Treatment Type: {CHL AMB THERAPY [phone]}  Reported Symptoms: ***  Mental Status Exam:  Appearance:   {PSY:22683}     Behavior:  {PSY:21022743}  Motor:  {PSY:22302}  Speech/Language:   {PSY:22685}  Affect:  {PSY:22687}  Mood:  {PSY:31886}  Thought process:  {PSY:31888}  Thought content:    {PSY:249-135-6024}  Sensory/Perceptual disturbances:    {PSY:313-116-6871}  Orientation:  {PSY:30297}  Attention:  {PSY:22877}  Concentration:  {PSY:8648458927}  Memory:  {PSY:272-810-9789}  Fund of knowledge:   {PSY:8648458927}  Insight:    {PSY:8648458927}  Judgment:   {PSY:8648458927}  Impulse Control:  {PSY:8648458927}   Risk Assessment: Danger to Self:  {PSY:22692} Self-injurious Behavior: {PSY:22692} Danger to Others: {PSY:22692} Duty to Warn:{PSY:311194} Physical Aggression / Violence:{PSY:21197} Access to Firearms a concern: {PSY:21197} Gang Involvement:{PSY:21197}  Subjective: ***   Interventions: {PSY:331-329-8768}  Diagnosis:No diagnosis found.  Plan: ***  Shanon Ace, LCSW

## 2022-10-23 DIAGNOSIS — M47816 Spondylosis without myelopathy or radiculopathy, lumbar region: Secondary | ICD-10-CM | POA: Diagnosis not present

## 2022-10-26 ENCOUNTER — Telehealth: Payer: Self-pay

## 2022-10-26 NOTE — Telephone Encounter (Signed)
[facility] Palliative Care Note    RN called and spoke with pt to change date and time of scheduled appt for April. Pt agreeable to change. Reported having second round of "back injections this past Monday. It feels a little better, but I am going to see Dr. Trenton Malone to see about ablation." No other issues reported.

## 2022-11-01 DIAGNOSIS — M47816 Spondylosis without myelopathy or radiculopathy, lumbar region: Secondary | ICD-10-CM | POA: Diagnosis not present

## 2022-11-02 ENCOUNTER — Ambulatory Visit (INDEPENDENT_AMBULATORY_CARE_PROVIDER_SITE_OTHER): Payer: Medicare Other | Admitting: Psychiatry

## 2022-11-02 DIAGNOSIS — F411 Generalized anxiety disorder: Secondary | ICD-10-CM | POA: Diagnosis not present

## 2022-11-02 NOTE — Progress Notes (Signed)
[facility] Counselor/Therapist Progress Note  Patient ID: Tracy Malone, MRN: [id number],    Date: 11/02/2022  Time Spent: 50 minutes  Treatment Type: Individual Therapy  Virtual Visit via Telehealth Note: Telephone only as patient not able to do the Video Connected with patient by a telemedicine/telehealth application, with their informed consent, and verified patient privacy and that I am speaking with the correct person using two identifiers. I discussed the limitations, risks, security and privacy concerns of performing psychotherapy and the availability of in person appointments. I also discussed with the patient that there may be a patient responsible charge related to this service. The patient expressed understanding and agreed to proceed. I discussed the treatment planning with the patient. The patient was provided an opportunity to ask questions and all were answered. The patient agreed with the plan and demonstrated an understanding of the instructions. The patient was advised to call  our office if  symptoms worsen or feel they are in a crisis state and need immediate contact.   Therapist Location: office Patient Location: home   Reported Symptoms:    Mental Status Exam:  Appearance:   N/a  -- telehealth      Behavior:  Appropriate, Sharing, and Motivated  Motor:  Uses cane to walk  Speech/Language:   Clear and Coherent  Affect:  N/a    telehealth  Mood:  anxious, depressed, and irritable  Thought process:  goal directed  Thought content:    Rumination  Sensory/Perceptual disturbances:    WNL  Orientation:  oriented to person, place, time/date, situation, day of week, month of year, year, and stated date of November 02, 2022  Attention:  Good  Concentration:  Good and Fair  Memory:  WNL  Fund of knowledge:   Good  Insight:    Good and Fair  Judgment:   Good  Impulse Control:  Good   Risk Assessment: Danger to Self:  No Self-injurious Behavior: No Danger to  Others: No Duty to Warn:no Physical Aggression / Violence:No  Access to Firearms a concern: No  Gang Involvement:No   Subjective: Patient today struggling physically and emotionally due to a return of increased back pain. Has  been dealing with her pain for a while but worsens at times and more recently this has been the case. Is in midst of getting several pain treatments, with her next treatment (oblation) within 2 weeks. Today needed session to process her concerns and fears re: her painful back situation. Feeling impatient, "get tired of waiting, back pain continues." Vented freely in our session today and seemed to benefit from the support and coping skills discussed in session that can help her especially during this time of waiting for her oblation on her back to be done within a couple weeks per her report. Also processed some sadness she is still feeling due to her son not being able to come visit yet from out of state.  Encouraged patient in her talking with others that care about her and not isolating herself even though right now she cannot physically get about very much due to her pain.  Interventions: Cognitive Behavioral Therapy and Ego-Supportive  Long term goal: Develop the ability to recognize, accept, and cope with feelings of anxiety and depression. Short term goal: Verbalize any unresolved grief issues that may be contributing to anxiety and depression. Strategy: Replace negative self-defeating self talk with verbalization of realistic and positive cognitive messages to lessen depression/anxiety and improve mood.  Diagnosis:  ICD-10-CM   1. Generalized anxiety disorder  F41.1      Plan: Patient in session today (telehealth) focusing on her anxiety and depression but showed good participation and motivation in the conversation.  Is making some gains and needs to continue working with goal-directed behaviors to move and a forward direction.  Encouraged patient in her  practice of more positive/self affirming/healing behaviors including: Remain on her medication as prescribed, refrain from assuming worst-case scenarios, refer to the list that we worked with previously that patient says helps her to "get centered" whenever she is anxious and stressed, use the other list that we worked on as a reminder for the positives and not to focus only on the negatives, participate in activities that she enjoys and is able to do physically and is okayed by her doctor, continue phone calls with friends, listening to music that she loves, positive self talk and self-care, occasionally getting out of the house that she is able, reading, and recognize the strength that she shows when working with goal-directed behaviors to move in a direction that supports her improved emotional health and overall wellbeing.  Goal review and progress/challenges noted with patient.  Next appt within 2-3 weeks.  This record has been created using [employer].  Chart creation errors have been sought, but may not always have been located and corrected.  Such creation errors do not reflect on the standard of medical care provided.   Mathis Fare, LCSW

## 2022-11-08 ENCOUNTER — Ambulatory Visit (INDEPENDENT_AMBULATORY_CARE_PROVIDER_SITE_OTHER): Payer: Medicare Other | Admitting: Neurology

## 2022-11-08 ENCOUNTER — Encounter: Payer: Self-pay | Admitting: Neurology

## 2022-11-08 VITALS — BP 108/45 | HR 61 | Ht 59.0 in | Wt 89.0 lb

## 2022-11-08 DIAGNOSIS — R269 Unspecified abnormalities of gait and mobility: Secondary | ICD-10-CM

## 2022-11-08 DIAGNOSIS — G250 Essential tremor: Secondary | ICD-10-CM

## 2022-11-08 DIAGNOSIS — R630 Anorexia: Secondary | ICD-10-CM

## 2022-11-08 DIAGNOSIS — G20A1 Parkinson's disease without dyskinesia, without mention of fluctuations: Secondary | ICD-10-CM | POA: Diagnosis not present

## 2022-11-08 DIAGNOSIS — F418 Other specified anxiety disorders: Secondary | ICD-10-CM | POA: Diagnosis not present

## 2022-11-08 NOTE — Patient Instructions (Incomplete)
HEALTH MAINTENANCE RECOMMENDATIONS:  It is recommended that you get at least 30 minutes of aerobic exercise at least 5 days/week (for weight loss, you may need as much as 60-90 minutes). This can be any activity that gets your heart rate up. This can be divided in 10-15 minute intervals if needed, but try and build up your endurance at least once a week.  Weight bearing exercise is also recommended twice weekly.  Eat a healthy diet with lots of vegetables, fruits and fiber.  "Colorful" foods have a lot of vitamins (ie green vegetables, tomatoes, red peppers, etc).  Limit sweet tea, regular sodas and alcoholic beverages, all of which has a lot of calories and sugar.  Up to 1 alcoholic drink daily may be beneficial for women (unless trying to lose weight, watch sugars).  Drink a lot of water.  Calcium recommendations are 1200-1500 mg daily (1500 mg for postmenopausal women or women without ovaries), and vitamin D 1000 IU daily.  This should be obtained from diet and/or supplements (vitamins), and calcium should not be taken all at once, but in divided doses.  Monthly self breast exams and yearly mammograms for women over the age of 47 is recommended.  Sunscreen of at least SPF 30 should be used on all sun-exposed parts of the skin when outside between the hours of 10 am and 4 pm (not just when at beach or pool, but even with exercise, golf, tennis, and yard work!)  Use a sunscreen that says "broad spectrum" so it covers both UVA and UVB rays, and make sure to reapply every 1-2 hours.  Remember to change the batteries in your smoke detectors when changing your clock times in the spring and fall. Carbon monoxide detectors are recommended for your home.  Use your seat belt every time you are in a car, and please drive safely and not be distracted with cell phones and texting while driving.   Tracy Malone , Thank you for taking time to come for your Medicare Wellness Visit. I appreciate your ongoing  commitment to your health goals. Please review the following plan we discussed and let me know if I can assist you in the future.   This is a list of the screening recommended for you and due dates:  Health Maintenance  Topic Date Due   Zoster (Shingles) Vaccine (1 of 2) Never done   DTaP/Tdap/Td vaccine (4 - Td or Tdap) 09/24/2019   COVID-19 Vaccine (6 - 2023-24 season) 03/24/2022   Flu Shot  02/22/2023   Pneumonia Vaccine  Completed   DEXA scan (bone density measurement)  Completed   HPV Vaccine  Aged Out   We discussed the recommendations for the following vaccines:  TdaP (tetanus booster)--you need to get this from the pharmacy RSV vaccine--you need to get this from the pharmacy, in the Fall.  You can get this the same day as the flu shot, or separate them by 2 weeks. COVID booster is recommended--you can get this from our office or from the pharmacy. Shingrix (shingles vaccine)--you need to get this from the pharmacy, and it is 2 doses, given 2 months apart.  Get them separated from other vaccines by 2 weeks.  The vaccines from the pharmacy are typically covered by Medicare Part D. If you don't have Part D or any supplemental coverage, you will need to discuss the cost with the pharmacist.  We discussed the severity of your osteoporosis, and the fracture risks.  You are currently not getting  any treatment for your osteoporosis. We briefly discussed Reclast infusions and Evenity (monthly injections for a year).  If you are willing to consider any of these, schedule a follow up appointment with Dr. Sharl Ma to further discuss them. In the meantime, all you can do is try and get adequate calcium in your diet, Vitamin D and weight-bearing exercise.  These help prevent further bone loss, but do not strengthen them.  Avoiding the pollen has been working well for you. If you develop any recurrent allergy symptoms, restart the flonase nasal spray.  You might want to try the flonase now, since you  are having watery eyes, and symptoms which could be related to allergies (somewhat odd that the left eye is only affected--usually allergies affects both eyes, but not always equally).  Consider doing seated exercises, until you are able to get back to walking.  Raising your arms above your head can help get your heart rate up and counts as aerobic exercise.

## 2022-11-08 NOTE — Progress Notes (Signed)
[facility]  PATIENT: Tracy Malone DOB: Jan 17, 1932  REFERRING DOCTOR OR PCP:  Joselyn Arrow SOURCE: patient and EMR records  _________________________________   HISTORICAL  CHIEF COMPLAINT:  Chief Complaint  Patient presents with   Room 11    Pt is here with her Husband.  Pt states that she is tired all the time. Pt states that she had back 2020.  Pt states that her tremors in her hands come and goes. Pt states that she is having muscle spasms in her right arm.     HISTORY OF PRESENT ILLNESS:  Tracy Malone is a 89 y.o.  woman with PD (vs PD/BET), anxiety/depression and history of L1 fracture.    Update 11/08/2022: She reports feeling worse.  She has more pain and feels more tired.  She continues to note a reduced appetite.  She has constipation. She is taking Sinemet 9 am, 1 pm, 5 pm.    She feels tremor is about the same    She notes it while resting but gets worse when she writes or if she gets upset.. It is also worse when anxious.  She has more depression and cries a lot.  She is sleeping poorly due to pain  Her tremor has been difficult to characterize and she appears to have a PD/BET overlap.    On Sinemet, her gait improved but the tremor did not change much.  Alprazolam has helped her tremor more.  She had a lot of benefit taking propranolol for the tremor but she had hypotension and needed to stop.  She is able to tolerate metoprolol a little better though it is less effective.  Her handwriting is poor.   Tremor is at rest and with intention.       She is taking Sinemet 25/100 tid and tolerates it well .  She takes every 4-5 hours while awake.    She is not having GI issues.  She also has GERD and we discussed that medicatoins for it may slow Sinemet absorption.   .    She has anxiety that has been a long-term issue.   She has depression with occasional crying.  She is not sure how much alprazolam helps.  She is on low-dose Prozac 10 mg.  Additionally for  anxiety she is on alprazolam.  She fractured the L1 vertebrae September 2020 while moving a mattress and had kyphoplasty 04/03/2019.   She will be having RFA medial branches for facet syndrome (had good result with MBB temporarily.   Tremor History:      She first noted a right hand tremor in late 2015.    She notes the tremor in the right hand much more than the left.   It worsened with intention but also present at rest   Tremor is not present when asleep.    She has not noted the tremor in the head.   Initially, she did not have any difficulty with her gait or balance.   There is no family history of tremors.   Alprazolam seemed to help the tremor.   Propranolol helped the tremor quite a bit but was poorly tolerated.   She started to have more gait issues around 2019   Sinemet seemed to help the gait but not the tremor.       REVIEW OF SYSTEMS: Constitutional: No fevers, chills, sweats, or change in appetite Eyes: No visual changes, double vision, eye pain Ear, nose and throat: No hearing loss, ear pain,  nasal congestion, sore throat Cardiovascular: No chest pain, palpitations.  Has had palpitations Respiratory:  No shortness of breath at rest or with exertion.   No wheezes GastrointestinaI: No nausea, vomiting, diarrhea, abdominal pain, fecal incontinence Genitourinary:  No dysuria, urinary retention or frequency.  No nocturia. Musculoskeletal:  No neck pain, back pain.  She notes right shoulder pain Integumentary: No rash, pruritus, skin lesions Neurological: as above Psychiatric: Some crying spells..  Some anxiety Endocrine: No palpitations, diaphoresis, change in appetite, change in weigh or increased thirst Hematologic/Lymphatic:  No anemia, purpura, petechiae. Allergic/Immunologic: No itchy/runny eyes, nasal congestion, recent allergic reactions, rashes  ALLERGIES: Allergies  Allergen Reactions   Iodinated Contrast Media Anaphylaxis   Iodine Anaphylaxis    IV and topical  forms. Other reaction(s): Unknown   Levsin [Hyoscyamine Sulfate]     Vision problems/pt has glaucoma   Salmon [Fish Allergy] Hives and Shortness Of Breath   Shellfish Allergy Anaphylaxis   Tramadol Swelling   Remeron [Mirtazapine] Other (See Comments)    Cause blurred vision and red eyes, pt has glaucoma   Aspirin Other (See Comments)    Sever stomach pain due to ulcer scaring.   Bis Subcit-Metronid-Tetracyc Swelling    Tongue swelling. Face tingling Other reaction(s): Unknown   Cephalexin Hives    Other reaction(s): hives   Ciprofloxacin Diarrhea   Codeine Nausea And Vomiting   Cyclobenzaprine Other (See Comments)    Tingly/prickly sensation. Other reaction(s): tingly/prickly sensation   Darvocet [Propoxyphene N-Acetaminophen] Nausea And Vomiting   Demerol [Meperidine] Nausea Only   Dexlansoprazole Swelling    Redness, swelling and peeling of both feet. Other reaction(s): foot pain   Diphedryl [Diphenhydramine] Other (See Comments)    Increased pulse/small amount ok   Doxycycline Hyclate Other (See Comments)    GI intolerance.   Doxycycline Hyclate     Other reaction(s): GI intolerance   Epinephrine Other (See Comments)    Breathing problems Other reaction(s): breathing problems/fainting   Erythromycin Other (See Comments)    GI intolerance. Other reaction(s): GI   Fish Oil     Other reaction(s): breathing problems/hives   Hyoscyamine     Other reaction(s): eye pain   Latex Other (See Comments)    Gloves ok.  Skin gets red from elastic in underwear and latex bandaides.   Meperidine Hcl     Other reaction(s): vomiting   Nitrofurantoin Diarrhea   Other     Other reaction(s): migraines Other reaction(s): Unknown Other reaction(s): Unknown Other reaction(s): Unknown Other reaction(s): increased pulse, faint, diarrhea   Prednisone Other (See Comments)    Headache Other reaction(s): headache   Ra Diphedryl Allergy [Diphenhydramine Hcl]     Other reaction(s):  increased pulse small dose okay   Sertraline Hcl Swelling and Other (See Comments)    Migraine Swelling of tongue/lip (09/2012) Other reaction(s): Unknown   Shellfish-Derived Products     Other reaction(s): Unknown   Sulfa Antibiotics Other (See Comments)    Increased pulse, fainting, diarrhea, thrush   Wellbutrin [Bupropion] Other (See Comments)    Headaches   Xylocaine [Lidocaine Hcl]     With epinephrine, given by dentist.  Speeded up heart rate and she passed out (occured twice, at dentist)   Xylocaine [Lidocaine]     Other reaction(s): Unknown   Biaxin [Clarithromycin] Rash    Started after completing 10 day course of 2000 mg /day, Lips swelling   Ibuprofen Other (See Comments)    Motrin ok with a GI effect. Other reaction(s): rash Motrin  okay with a GI effect    HOME MEDICATIONS:  Current Outpatient Medications:    Carbidopa-Levodopa ER (SINEMET CR) 25-100 MG tablet controlled release, TAKE ONE TABLET THREE TIMES DAILY, Disp: 270 tablet, Rfl: 3   FLUoxetine (PROZAC) 10 MG tablet, Take 10 mg by mouth daily., Disp: , Rfl:    SYNTHROID 25 MCG tablet, TAKE 1 TABLET MONDAY THROUGH SATURDAY, SKIP SUNDAY FOR HYPOTHYROIDISM, Disp: 90 tablet, Rfl: 0   ALPRAZolam (XANAX) 0.5 MG tablet, TAKE FOUR TABLETS BY MOUTH DAILY AS NEEDED FOR ANXIETY, Disp: 120 tablet, Rfl: 2   dicyclomine (BENTYL) 10 MG capsule, TAKE 1 CAPSULE BY MOUTH EVERY 6 HOURS AS NEEDED (Patient not taking: Reported on 08/24/2022), Disp: 30 capsule, Rfl: 6   docusate sodium (COLACE) 100 MG capsule, Take 1 capsule (100 mg total) by mouth 2 (two) times daily as needed for mild constipation. (Patient not taking: Reported on 08/24/2022), Disp: 60 capsule, Rfl: 0   loperamide (IMODIUM) 2 MG capsule, Take 2 mg by mouth as needed for diarrhea or loose stools. (Patient not taking: Reported on 08/24/2022), Disp: , Rfl:    polyethylene glycol (MIRALAX) 17 g packet, Take 17 g by mouth daily as needed. (Patient not taking: Reported on  08/24/2022), Disp: 14 each, Rfl: 0  PAST MEDICAL HISTORY: Past Medical History:  Diagnosis Date   Bell's palsy 1966   Hx: right side facial droop, resolved per patient 04/02/19   Carotid artery disease 2010   on vascular screening;unchanged 2013.(could not tolerate simvastatin, no other statins tried)--<30% blockage bilat 07/2011   Chronic abdominal pain    Chronic fatigue and malaise    Claustrophobia    Cyst of left ovary    last imaging 06/2021, benign, no further f/u   Depression    treated in the past for years;stopped in 2010 for a years   Duodenal ulcer 1962   h/o   Dysrhythmia    ocassional PVC's, no current problems per patient on 04/02/19   Fibromyalgia    Frequent PVCs 07/2012   Seen by [facility]: benign, asymptomatic, normal EF   Gallstones 02/2021   on [place]   GERD (gastroesophageal reflux disease)    diet controlled   Glaucoma, narrow-angle    s/p laser surgery   History of hiatal hernia    during endoscopy   Hypothyroid 03/2007   IBS (irritable bowel syndrome)    Dr. Elnoria Howard   Ischemic colitis 11/21/2018   no current problems per patient on 04/02/19   Lichenoid keratosis 12/03/2020   Dr.Stinehelfer   Nausea and vomiting 10/29/2019   Ocular migraine    Osteoporosis 04/2010   Dr.Hawkes; later consulted Dr. Sharl Ma (2022)   Panic attack    Parkinson disease    Parkinson's disease 06/23/2016   Recurrent UTI    has cystocele-Dr.Grewal   Shingles 1999   h/o   Superficial thrombophlebitis 03/2009   RLE   Trochanteric bursitis 12/2008   bilateral    PAST SURGICAL HISTORY: Past Surgical History:  Procedure Laterality Date   ABDOMINAL HYSTERECTOMY     BACK SURGERY  05/2019   CATARACT EXTRACTION, BILATERAL  1995, 1996   EYE SURGERY Bilateral    laser - glaucoma   Flexible sigmoidoscopy     KYPHOPLASTY N/A 04/03/2019   Procedure: KYPHOPLASTY L1;  Surgeon: Venita Lick, MD;  Location: [facility] OR;  Service: Orthopedics;  Laterality: N/A;  90 mins   THYROIDECTOMY,  PARTIAL  09/2005   L nodule; Dr. Gerrit Friends   TONSILLECTOMY  1946   UPPER GI ENDOSCOPY  06/27/2012   VAGINAL HYSTERECTOMY  1971   and bladder repair.  Still has ovaries   WISDOM TOOTH EXTRACTION      FAMILY HISTORY: Family History  Problem Relation Age of Onset   Heart disease Mother    Hypertension Mother    Hypertension Sister    HIV Son    Heart disease Brother    Lung cancer Brother        lung   Diabetes Maternal Grandfather    Diabetes Granddaughter        type 1   Colon cancer Neg Hx    Esophageal cancer Neg Hx    Prostate cancer Neg Hx    Stomach cancer Neg Hx    Rectal cancer Neg Hx     SOCIAL HISTORY:  Social History   Socioeconomic History   Marital status: Married    Spouse name: Chrissie Noa   Number of children: 2   Years of education: Not on file   Highest education level: Not on file  Occupational History   Occupation: retired (school system)  Tobacco Use   Smoking status: Never   Smokeless tobacco: Never  Vaping Use   Vaping Use: Never used  Substance and Sexual Activity   Alcohol use: No    Alcohol/week: 0.0 standard drinks of alcohol   Drug use: No   Sexual activity: Not Currently    Partners: Male    Birth control/protection: Other-see comments    Comment: Hysterectomy  Other Topics Concern   Not on file  Social History Narrative   Married.  Son lives in [place]; Daughter [name] lives in [place]; 2 grandchildren   Social Determinants of Health   Financial Resource Strain: Low Risk  (05/05/2021)   Overall Financial Resource Strain (CARDIA)    Difficulty of Paying Living Expenses: Not hard at all  Food Insecurity: Not on file  Transportation Needs: No Transportation Needs (05/05/2021)   PRAPARE - Administrator, Civil Service (Medical): No    Lack of Transportation (Non-Medical): No  Physical Activity: Not on file  Stress: Not on file  Social Connections: Not on file  Intimate Partner Violence: Not on file     PHYSICAL  EXAM  Vitals:   11/08/22 1417  BP: (!) 108/45  Pulse: 61  Weight: 89 lb (40.4 kg)  Height:  (1.499 m)    Body mass index is 17.98 kg/m.   General: The patient is well-developed and well-nourished and in no acute distress  Skin:   No edema.    There were a couple small subcutaneous nodules in the right arm. .    Neurologic Exam  Mental status: The patient is alert and oriented x 3 at the time of the examination. The patient has apparent normal recent and remote memory, with an apparently normal attention span and concentration ability.   Speech is normal.  Cranial nerves: Extraocular movements are full.   Facial strength is normal.  Trapezius and sternocleidomastoid strength is normal. No dysarthria is noted.    Motor: She has mild bradykinesia.  There is a 5-6 Hz tremor on the right > left  hand.  It is worse during rest and also increases with intention.  Talking about it worsened it.   Mild cogwheeling.   Muscle bulk is normal.   Strength is  5 / 5 in all 4 extremities.   Sensory: Sensory testing is intact to touch and vibration sensation in all  4 extremities.  Coordination: Cerebellar testing reveals good finger-nose-finger bilaterally.  Gait and station: Station is normal.   The gait has a mildly reduced stride, and normal arm swing.  Able to walk withou a cane  She was able to turn 180 degrees in 4 steps Romberg is negative.  Reflexes: Deep tendon reflexes are symmetric and normal bilaterally.    DIAGNOSTIC DATA (LABS, IMAGING, TESTING) - I reviewed patient records, labs, notes, testing and imaging myself where available.  Lab Results  Component Value Date   WBC 8.7 10/16/2022   HGB 14.2 10/16/2022   HCT 41.7 10/16/2022   MCV 91 10/16/2022   PLT 255 10/16/2022      Component Value Date/Time   NA 144 07/12/2022 1632   K 4.3 07/12/2022 1632   CL 104 07/12/2022 1632   CO2 27 07/12/2022 1632   GLUCOSE 98 07/12/2022 1632   GLUCOSE 91 04/20/2022 1022   BUN  12 07/12/2022 1632   CREATININE 0.74 07/12/2022 1632   CREATININE 0.75 10/22/2018 1401   CREATININE 0.67 11/23/2016 0815   CALCIUM 9.7 07/12/2022 1632   PROT 7.0 07/12/2022 1632   ALBUMIN 4.3 07/12/2022 1632   AST 23 07/12/2022 1632   AST 17 10/22/2018 1401   ALT 6 07/12/2022 1632   ALT 7 10/22/2018 1401   ALKPHOS 70 07/12/2022 1632   BILITOT 1.2 07/12/2022 1632   BILITOT 0.8 10/22/2018 1401   GFRNONAA >60 04/20/2022 1022   GFRNONAA >60 10/22/2018 1401   GFRAA >60 03/16/2020 1153   GFRAA >60 10/22/2018 1401   Lab Results  Component Value Date   CHOL 198 03/10/2021   HDL 66 03/10/2021   LDLCALC 112 (H) 03/10/2021   TRIG 111 03/10/2021   CHOLHDL 3.0 03/10/2021   Lab Results  Component Value Date   HGBA1C 5.3 10/07/2020   No results found for: "VITAMINB12" Lab Results  Component Value Date   TSH 1.130 10/16/2022       ASSESSMENT AND PLAN    1. Parkinson's disease without dyskinesia, unspecified whether manifestations fluctuate   2. Essential tremor   3. Gait disturbance   4. Depression with anxiety   5. Poor appetite      1.    She appears to have PD/BET overlap.  continue Sinemet CR 25/100 3 pills a day.  She had clear-cut benefit when initiated.  Continue alprazolam (written by psychiatry) which will help BET some 2.   Advised to use walker or cane for safety 3.    She has had weight loss.  We discussed switching from Prozac to sertraline or mirtazapine but she states sertraline  did not hep her in past and mirtazapine caused blurry vision.   She asked about medical MJ.  We discussed medical marijuana is not an option in the state but she could try Cbd for insomnia.    5.   She will return to see me in 6 months or as needed if there are new or worsening neurologic symptoms.  41-minute office visit with the majority of the time spent face-to-face for history and physical, discussion/counseling and decision-making.  Additional time with record review and  documentation.  Marlys Stegmaier A. Epimenio Foot, MD, PhD 11/08/2022, 5:13 PM Certified in Neurology, Clinical Neurophysiology, Sleep Medicine, Pain Medicine and Neuroimaging  [facility] [address], [address] [place], [place] [address] [phone]

## 2022-11-08 NOTE — Progress Notes (Unsigned)
No chief complaint on file.  Tracy Malone is a 89 y.o. female who presents for annual wellness visit and follow-up on chronic medical conditions.    She remains very depressed. She is under the care of psychiatrist and therapist. She was seen a few weeks ago, and was having trouble sleeping, and chronic pain was a factor affecting sleep and moods. At that time she had a medial branch block procedure for her pain, it helped a little. Since then, she had another injection (4/1?? UPDATE ***), may possibly have ablation in the future.  She reported having some muscle spasms in the legs and back, and numbness down to her ankle.   She requested labs at her recent visit (3/25), felt weak, tired, hair falling out.  Husband said she wasn't eating enough. She had normal TSH and CBC at that visit.  We had discussed lactose-free supplements. She is now also followed by Palliative Care.  She has some chronic GI issues, including constipation and bloating.  At one point (Jan, Feb) she was having epigastric discomfort, belching.  She took omeprazole x 4 days (stopped due to headache, and reports her symptoms had improved).  She uses Gaviscon and Tums instead, when needed. She has been moving bowels daily, some straining.  UPDATE Denies melena or hematochezia. She continues to take Align daily. Bentyl? Miralax?    UPDATE BELOW Osteoporosis, with L1 compression fracture, s/p kyphoplasty. She had known osteoporosis prior to this fracture (DEXA done in 2016 by Dr. Nickola Major). She stopped Prolia due to joint pains.  She has seen Dr. Sharl Ma in follow-up to discuss her osteoporosis. She declined all treatments that were suggested.  She last saw him in July 2023, notes not received until requested upon preparing for this visit.  DEXA was done by Dr. Vincente Poli 08/19/20--T-4.1 at R femoral neck, -3.7 at L femoral neck, -3.2 spine. Vitamin D level was normal at 48.9 in 10/2021.  Hypothyroidism:  She reports compliance  with taking her Synthroid 6 days/week, skipping Sunday's.  She takes it on an empty stomach, separate from her other medications. She has chronic issues with fatigue, hair loss, dry skin, depression.  No significant changes.  Recent TSH was normal.  Review of Dr. Daune Perch note from July mentioned recommendation for Rogaine for Women.  Lab Results  Component Value Date   TSH 1.130 10/16/2022   Allergies--previously controlled with homemade saline twice daily. She has been using Flonase 1 spray twice daily. UPDATE  Parkinson's and Essential Tremor, under the care of Dr. Epimenio Foot. She last saw him yesterday. She continues on Sinemet 25/100 TID (helps gait, not tremor).  Propranolol helped with tremor, but wasn't tolerated due to low BP.  Tremor is worse when anxious, xanax helps it. Tremor interferes with her writing, but not with her ADL's.  Atherosclerotic calcification of the aortic knob was noted on CXR in 02/2020. She was also noted to have 20-30% stenosis of R ICA on [place] in 2013, mild plaque noted. She was intolerant of statins due to myalgias. She has refused to retry these. She also had incidental finding of gallstones. She remains asymptomatic.   Immunization History  Administered Date(s) Administered   Fluad Quad(high Dose 65+) 05/15/2019, 04/14/2021, 05/01/2022   Influenza Split 05/16/2011, 06/22/2015, 05/12/2016   Influenza, High Dose Seasonal PF 06/13/2013, 04/28/2014, 05/12/2017, 04/17/2018, 05/13/2020   Influenza-Unspecified 05/17/2016   PFIZER(Purple Top)SARS-COV-2 Vaccination 08/28/2019, 09/18/2019, 04/05/2020, 07/12/2020, 08/17/2020   Pneumococcal Conjugate-13 09/29/2013   Pneumococcal Polysaccharide-23 08/07/1999, 08/29/2018  Td 08/06/1997, 09/23/2009   Tdap 09/23/2009   Last Pap smear: 03/2015 with Dr. Vincente Poli; Last saw Dr. Vincente Poli 10/2021 Last mammogram: 06/2019.  Couldn't do it due to the ongoing pain from shingles at the L chest, rib pain. Last colonoscopy: 2006;  negative Cologard 04/2015 and 05/2018 Last DEXA: 07/2020 through Dr. Leitha Schuller at R femoral neck, -3.7 at L femoral neck, -3.2 spine Dentist: twice yearly Ophtho: yearly Exercise:  Home exercises from Dr. Jean Rosenthal.  Walks some in the house. Has some hand weights that she uses when sitting.  Lipids: Lab Results  Component Value Date   CHOL 198 03/10/2021   HDL 66 03/10/2021   LDLCALC 112 (H) 03/10/2021   TRIG 111 03/10/2021   CHOLHDL 3.0 03/10/2021   Patient Care Team: Joselyn Arrow, MD as PCP - General (Family Medicine) Duffy, Vella Kohler, LCSW as Social Worker (Licensed Clinical Social Worker) Crawford Givens, RN as Registered Nurse (Hospice and Palliative Medicine) Verner Chol, [facility] (Inactive) as Pharmacist (Pharmacist) GYN:  Dr. Vincente Poli Ophtho: Dr. Iva Lento Dentist:  Dr. Jerald Kief GI: Dr. Christella Hartigan Neuro: Dr. Epimenio Foot  Cardiology:  Dr. Eden Emms (in the past) Derm: Dr. Lovenia Kim Psychiatrist: Yvette Rack, NP Psychologist:  Mathis Fare Endocrine: Dr. Sharl Ma Podiatry: Dr. Eloy End (no longer sees) Ortho: Dr. Corine Shelter (or ankle), Dr. Shon Baton (back)--no longer sees Pain: Dr. Ethelene Hal (no longer sees) Veins: Dr. Ardyth Gal Rheum:  Dr. Kathi Ludwig (previously Dr. Nickola Major), no longer sees Urologist: Dr. Sherryl Barters (no longer sees)  Depression Screening: Flowsheet Row Office Visit from 03/20/2022 in [facility]  PHQ-2 Total Score 6       Flowsheet Row Office Visit from 03/20/2022 in [facility]  PHQ-9 Total Score 19       Falls screen:     08/24/2022    1:57 PM 03/20/2022    3:04 PM 11/02/2021    9:59 AM 04/14/2021   11:26 AM 09/08/2020    9:47 AM  Fall Risk   Falls in the past year? 0 0 0 0 1  Number falls in past yr: 0 0 0 0 0  Injury with Fall? 0 0 0 0 1  Comment     04/30/20-2 fx ribs (L side)  Risk for fall due to : No Fall Risks  Impaired balance/gait History of fall(s)   Follow up Falls evaluation completed Falls evaluation completed Falls  evaluation completed Falls evaluation completed      Functional Status Survey:         End of Life: She has a healthcare power of attorney and living will.  Not in chart.   PMH, PSH, SH and FH reviewed    ROS:  The patient denies fever, headaches, decreased hearing, breast concerns, exertional chest pain, dizziness, syncope, cough, swelling, nausea, vomiting, melena, hematochezia, hematuria, incontinence, dysuria, vaginal bleeding, discharge, odor or itch, genital lesions, numbness, tingling, weakness, suspicious skin lesions, abnormal bleeding, or enlarged lymph nodes.  Some easy bruising. Chronic low back pain Depression and anxiety Constipation and bloating, with L sided abdominal pain--improved. Tremor R>L unchanged Some knee pain, chronic and mild, comes and goes. Decreased appetite, but forces herself to eat. +weight loss   PHYSICAL EXAM:  LMP  (LMP Unknown)   Wt Readings from Last 3 Encounters:  11/08/22 89 lb (40.4 kg)  10/16/22 86 lb 12.8 oz (39.4 kg)  09/13/22 90 lb (40.8 kg)  Wt 96# 9.6 oz at [facility] 10/2021 Wt 99# 6.4 oz at [facility] 08/2020 Wt 100# 3.2 oz at her  [facility] 08/2019  Pleasant, thin, frail female.  She is in pretty good spirits today, not complaining about much (just chronic back pain, not acutely flaring). She is alert and oriented.  She has a pill-rolling, resting tremor, R>L. She drew very well within the spiral, only minimal tremor noted. She has depressed mood, but normal eye contact, speech, hygiene and grooming.  Moods do seem better, improved range of affect. Purpura noted on hands/forearms.  UPDATE ENTIRE PHYSICAL   ASSESSMENT/PLAN:  Did she ever get Tdap from pharmacy?  RSV? COVID booster? Shingrix? I'm not sure if they have part D--may be the reason , not sure of cost for Tdap, suspect others are $$  Is she scheduled to see Dr. Vincente Poli (last 10/2021)  Discussed monthly self breast exams and yearly mammograms (past due, if still desired, fine to  stop if that is her preference); at least 30 minutes of aerobic activity at least 5 days/week, weight-bearing exercise at least 2-3x/week; proper sunscreen use reviewed; healthy diet, including goals of calcium and vitamin D intake and alcohol recommendations (less than or equal to 1 drink/day) reviewed; regular seatbelt use; changing batteries in smoke detectors.  Immunization recommendations discussed--continue yearly high dose flu shots.  Past due for TdaP from pharmacy.  Shingrix also recommended. RSV vaccine is recommended in the Fall. She reports not having part D, so may need to check with [facility] re: cost.  Bivalent COVID booster recommended, declined. Colon cancer screening recommendations reviewed--no longer recommended.    MOST form reviewed, Full Code. Does not want intubation/ICU. Asked for copies of her living will/HCPOA to be scanned into chart.  F/u 6 mos for med check, sooner prn,    Medicare Attestation I have personally reviewed: The patient's medical and social history Their use of alcohol, tobacco or illicit drugs Their current medications and supplements The patient's functional ability including ADLs,fall risks, home safety risks, cognitive, and hearing and visual impairment Diet and physical activities Evidence for depression or mood disorders  The patient's weight, height, BMI have been recorded in the chart.  I have made referrals, counseling, and provided education to the patient based on review of the above and I have provided the patient with a written personalized care plan for preventive services.

## 2022-11-09 ENCOUNTER — Encounter: Payer: Self-pay | Admitting: Family Medicine

## 2022-11-09 ENCOUNTER — Ambulatory Visit (INDEPENDENT_AMBULATORY_CARE_PROVIDER_SITE_OTHER): Payer: Medicare Other | Admitting: Family Medicine

## 2022-11-09 VITALS — BP 132/70 | HR 64 | Ht <= 58 in | Wt 88.4 lb

## 2022-11-09 DIAGNOSIS — M810 Age-related osteoporosis without current pathological fracture: Secondary | ICD-10-CM

## 2022-11-09 DIAGNOSIS — E039 Hypothyroidism, unspecified: Secondary | ICD-10-CM | POA: Diagnosis not present

## 2022-11-09 DIAGNOSIS — Z7185 Encounter for immunization safety counseling: Secondary | ICD-10-CM

## 2022-11-09 DIAGNOSIS — T466X5A Adverse effect of antihyperlipidemic and antiarteriosclerotic drugs, initial encounter: Secondary | ICD-10-CM | POA: Diagnosis not present

## 2022-11-09 DIAGNOSIS — K8020 Calculus of gallbladder without cholecystitis without obstruction: Secondary | ICD-10-CM | POA: Diagnosis not present

## 2022-11-09 DIAGNOSIS — Z0000 Encounter for general adult medical examination without abnormal findings: Secondary | ICD-10-CM

## 2022-11-09 DIAGNOSIS — M545 Low back pain, unspecified: Secondary | ICD-10-CM

## 2022-11-09 DIAGNOSIS — G250 Essential tremor: Secondary | ICD-10-CM | POA: Diagnosis not present

## 2022-11-09 DIAGNOSIS — G20A1 Parkinson's disease without dyskinesia, without mention of fluctuations: Secondary | ICD-10-CM | POA: Diagnosis not present

## 2022-11-09 DIAGNOSIS — E46 Unspecified protein-calorie malnutrition: Secondary | ICD-10-CM

## 2022-11-09 DIAGNOSIS — I700 Atherosclerosis of aorta: Secondary | ICD-10-CM

## 2022-11-09 DIAGNOSIS — F332 Major depressive disorder, recurrent severe without psychotic features: Secondary | ICD-10-CM | POA: Diagnosis not present

## 2022-11-09 DIAGNOSIS — M791 Myalgia, unspecified site: Secondary | ICD-10-CM | POA: Diagnosis not present

## 2022-11-14 ENCOUNTER — Encounter: Payer: Self-pay | Admitting: Family Medicine

## 2022-11-14 ENCOUNTER — Other Ambulatory Visit: Payer: Medicare Other

## 2022-11-14 DIAGNOSIS — M47816 Spondylosis without myelopathy or radiculopathy, lumbar region: Secondary | ICD-10-CM | POA: Diagnosis not present

## 2022-11-14 HISTORY — PX: RADIOFREQUENCY ABLATION: SHX2290

## 2022-11-17 ENCOUNTER — Other Ambulatory Visit: Payer: Medicare Other

## 2022-11-17 VITALS — BP 120/60 | HR 63 | Temp 99.1°F

## 2022-11-17 DIAGNOSIS — Z515 Encounter for palliative care: Secondary | ICD-10-CM

## 2022-11-17 NOTE — Progress Notes (Signed)
PATIENT NAME: Tracy Malone DOB: Dec 28, 1931 MRN: [id number]  PRIMARY CARE PROVIDER: Joselyn Arrow, MD  RESPONSIBLE PARTY:  Acct ID - Guarantor Home Phone Work Phone Relationship Acct Type  [id number] Tracy Malone, MELITO* [phone]  Self P/F     [address], [place], [place] [address]   Palliative Care Follow Up Encounter Note   Completed home visit. [name] also present.     HISTORY OF PRESENT ILLNESS:  89 y.o. year old female with Parkinson's Disease, osteopenia, lumbar compression fracture, aortic atherosclerosis, GERD, IBS, functional dyspepsia, hx of colitis and ischemic colitis, chronic lymphocytic thyroiditis, cystocele, early satiety, depression with anxiety, low back pain with sciatica, bilateral leg pain and hx of trochanteric bursitis   TODAY'S VISIT:  Respiratory: denies SOB during this visit; reports she gets SOB when she is anxious  Cardiac: no edema; no incidences of PVC in the past year  Cognitive: alert and oriented   Appetite: pt reports she doesn't have an appetite but she eats small meals often; occasional nutritional snacks; she reports she "drinks a lot of water"; at least 8-8oz glasses  GI/GU: has a BM daily; no urinary issues   Mobility: ambulates independently  ADLs: able to groom, bathe and dress herself;  pays a housekeeper to do other ADLs   Sleeping Pattern: sleeps throughout the night but can go back to sleep occasionally  Pain: 8/10 pain to the back; pt refused all pain meds except Tylenol  Wt: remains at 88 lbs    Goals of Care: To stay in the home as long as possible with her husband    Next Appt Scheduled For: 12/12/22 @ 11:30am    CODE STATUS: Full Code ADVANCED DIRECTIVES: N MOST FORM: Y PPS: 70%   PHYSICAL EXAM:   VITALS: Today's Vitals   11/17/22 1016  BP: 120/60  Pulse: 63  Temp: 99.1 F (37.3 C)  TempSrc: Temporal  SpO2: 98%  PainSc: 8   PainLoc: Back    LUNGS: clear to auscultation  CARDIAC: Cor  RRR EXTREMITIES: AROM x4; R Great toe angry red, no open areas; pt reports 6/10 (reported to and seen by Podiatrist) SKIN: Skin color, texture, turgor normal. No rashes or lesions or nails normal without clubbing, no edema, no evidence of bleeding or bruising, and no lesions noted  NEURO: negative except for tremors and weakness       Zionna Homewood Clementeen Graham, LPN

## 2022-11-21 ENCOUNTER — Encounter: Payer: Self-pay | Admitting: Family Medicine

## 2022-11-22 ENCOUNTER — Ambulatory Visit (INDEPENDENT_AMBULATORY_CARE_PROVIDER_SITE_OTHER): Payer: Medicare Other | Admitting: Psychiatry

## 2022-11-22 DIAGNOSIS — F411 Generalized anxiety disorder: Secondary | ICD-10-CM

## 2022-11-22 NOTE — Progress Notes (Signed)
[facility] Counselor/Therapist Progress Note  Patient ID: Tracy Malone, MRN: [id number],    Date: 11/22/2022            Time Spent: 45 minutes   Treatment Type: Individual Therapy  Virtual Visit via Telehealth Note: Telephone only; Patient cannot do the video due to physical issues Connected with patient by a telemedicine/telehealth application, with their informed consent, and verified patient privacy and that I am speaking with the correct person using two identifiers. I discussed the limitations, risks, security and privacy concerns of performing psychotherapy and the availability of in person appointments. I also discussed with the patient that there may be a patient responsible charge related to this service. The patient expressed understanding and agreed to proceed. I discussed the treatment planning with the patient. The patient was provided an opportunity to ask questions and all were answered. The patient agreed with the plan and demonstrated an understanding of the instructions. The patient was advised to call  our office if  symptoms worsen or feel they are in a crisis state and need immediate contact.   Therapist Location: office Patient Location: home  Reported Symptoms: "I just feel so upset" referring to her pain from recent back injection. Very upset today about her back situation but did not want to postpone our telehealth appt. Very tearful and processing how discouraged she is with the pain she is having with her back. Quite tearful and irritable. Had recent procedure and hard to manage the pain she is feeling. Has called her Dr's office to see if there's something that she can get to help her pain and is waiting to hear back from them. Was able to eventually calm some and talk about some personal and family issues which seemed helpful to her.  Reports that her main symptom is anxiety but also having some depression.  Mental Status Exam:  Appearance:   N/a   Telehealth       Behavior:  Difficulty being motivated  Motor:  Uses a rollator to walk  Speech/Language:   Initially  extremely tearful but did calm more the more we talked and she felt heard  Affect:  N/a   Telehealth  Mood:  anxious and tearful, irritable  Thought process:  Reports that it's "harder to think because of her pain level" but did not want to end session early  Thought content:    Rumination  Sensory/Perceptual disturbances:    WNL  Orientation:  oriented to person, place, time/date, situation, day of week, month of year, year, and stated date of Nov 22, 2022  Attention:  Fair  Concentration:  Fair  Memory:  WNL  Fund of knowledge:   Good and Fair  Insight:    Good and Fair  Judgment:   Good  Impulse Control:  Good   Risk Assessment: Danger to Self:  No Self-injurious Behavior: No Danger to Others: No Duty to Warn:no Physical Aggression / Violence:No  Access to Firearms a concern: No  Gang Involvement:No   Subjective: Patient today struggling a lot physically per her report as she had a recent type of back injection and is having a lot of pain from it.  I offered for [place] to reschedule her appointment and she made it very clear that she did not want to do that so we continued with it today.  Needed most of the time to feel more supported and cared about as she seemed to feel others were not hearing her.  Talked with patient about the fact she is surrounded by people that care about her and she may just not feel it so much right now since she is reporting significant pain.  Very tearful initially and eventually the tearfulness decreased and stopped.  Talked about her fears and concerns about her back situation and whether or not she would get better, as it was difficult for her to feel anything would help.  She did state however that she is called her doctor's office to see what she might be able to do to help with her pain.  It seemed that talking things through more thoroughly it did help  her because she got better with less complaints of pain but did not have any extra medication on board and we were talking, she did use the time well to freely vent and get some emotional support, with the tearfulness having ended although she still stated she was in pain.  Denies SI.  Encouraged her to follow what ever recommendations her doctor provides her with and she is scheduled for another appointment with this therapist within 2 weeks.  Denies any thoughts to harm self or others.  Does have her husband there with her as well as a care provider from [facility].  Interventions: Cognitive Behavioral Therapy and Ego-Supportive  Long term goal: Develop the ability to recognize, accept, and cope with feelings of anxiety and depression. Short term goal: Verbalize any unresolved grief issues that may be contributing to anxiety and depression. Strategy: Replace negative self-defeating self talk with verbalization of realistic and positive cognitive messages to lessen depression/anxiety and improve mood.  Diagnosis:   ICD-10-CM   1. Generalized anxiety disorder  F41.1      Plan:  Patient in session today reporting that she needs to focus on her anxiety and depression because of her physical pain she is feeling from a recent back procedure, as noted above.  Needed a lot of emotional support this session and was able to finally be less anxious and talk through a lot of her concerns about her pain and feeling that her back is not getting better.  Patient is progressing and needs to continue with goal-directed behaviors to move in a forward direction. Encouraged patient and practicing more positive/self affirming/healing behaviors including: Staying on her medication as prescribed, not assuming worst-case scenarios, refer to the list that we worked with previously that patient says helps her to "get centered" whenever she is anxious and stressed, use the other list that we worked on as a reminder for  the positives and not to focus only on the negatives, participate in activities that she enjoys and is able to do physically and is okayed by her doctor, continue phone calls with friends, listening to music that she loves, positive self talk and self-care, occasionally getting out of the house as she is able, and realize the strength she shows when working with goal-directed behaviors to move in a direction that supports her improved emotional health and outlook.  Goal review and progress/challenges noted with patient.  Next appointment within 2 to 3 weeks.   Mathis Fare, LCSW

## 2022-11-23 ENCOUNTER — Ambulatory Visit (INDEPENDENT_AMBULATORY_CARE_PROVIDER_SITE_OTHER): Payer: Medicare Other | Admitting: Family Medicine

## 2022-11-23 ENCOUNTER — Encounter: Payer: Self-pay | Admitting: Family Medicine

## 2022-11-23 VITALS — BP 122/74 | HR 72 | Temp 98.2°F | Ht <= 58 in | Wt 88.6 lb

## 2022-11-23 DIAGNOSIS — M79674 Pain in right toe(s): Secondary | ICD-10-CM

## 2022-11-23 NOTE — Patient Instructions (Signed)
  Try your new shoes to see how the great toes feel. Consider a donut type of pad, where you put the painful area in the center of the pad. I recommend following up with a podiatrist for re-evaluation if you have ongoing pain in the great toes.

## 2022-11-23 NOTE — Progress Notes (Signed)
Chief Complaint  Patient presents with   Callouses    Patient has a callus on bottom of right foot. Very painful.    She is having pain on the bottom of the right great toe.  She reports there is a hard area on the bottom of the toe. Her husband thinks it is a callous. She states that it doesn't hurt to touch or press on, but she has pain with walking. It has gotten larger and more painful since the visit with podiatrist (3/5). She states she reported this problem to podiatrist, but didn't feel like it was addressed.  She has been using salicylic acid twice daily, after soaking the toe. Tried pumice stone but it seems loose, not hard.   Denies any foreign body sensation or stepping on anything.  She just got new shoes with more metatarsal support, hasn't tried them yet.   PMH, PSH, SH reviewed  Outpatient Encounter Medications as of 11/23/2022  Medication Sig Note   Carbidopa-Levodopa ER (SINEMET CR) 25-100 MG tablet controlled release TAKE ONE TABLET THREE TIMES DAILY    FLUoxetine (PROZAC) 10 MG tablet Take 10 mg by mouth daily.    SYNTHROID 25 MCG tablet TAKE 1 TABLET MONDAY THROUGH SATURDAY, SKIP SUNDAY FOR HYPOTHYROIDISM    ALPRAZolam (XANAX) 0.5 MG tablet TAKE FOUR TABLETS BY MOUTH DAILY AS NEEDED FOR ANXIETY (Patient not taking: Reported on 11/23/2022) 11/23/2022: Last dose last night   dicyclomine (BENTYL) 10 MG capsule TAKE 1 CAPSULE BY MOUTH EVERY 6 HOURS AS NEEDED (Patient not taking: Reported on 11/09/2022) 11/23/2022: As needed   docusate sodium (COLACE) 100 MG capsule Take 1 capsule (100 mg total) by mouth 2 (two) times daily as needed for mild constipation. (Patient not taking: Reported on 11/23/2022) 11/23/2022: As needed   loperamide (IMODIUM) 2 MG capsule Take 2 mg by mouth as needed for diarrhea or loose stools. (Patient not taking: Reported on 08/24/2022) 11/23/2022: As needed, has not needed     polyethylene glycol (MIRALAX) 17 g packet Take 17 g by mouth daily as needed. (Patient not  taking: Reported on 08/24/2022) 11/23/2022: As needed, has not needed lately     No facility-administered encounter medications on file as of 11/23/2022.   Allergies  Allergen Reactions   Iodinated Contrast Media Anaphylaxis   Iodine Anaphylaxis    IV and topical forms. Other reaction(s): Unknown   Levsin [Hyoscyamine Sulfate]     Vision problems/pt has glaucoma   Salmon [Fish Allergy] Hives and Shortness Of Breath   Shellfish Allergy Anaphylaxis   Tramadol Swelling   Remeron [Mirtazapine] Other (See Comments)    Cause blurred vision and red eyes, pt has glaucoma   Aspirin Other (See Comments)    Sever stomach pain due to ulcer scaring.   Bis Subcit-Metronid-Tetracyc Swelling    Tongue swelling. Face tingling Other reaction(s): Unknown   Cephalexin Hives    Other reaction(s): hives   Ciprofloxacin Diarrhea   Codeine Nausea And Vomiting   Cyclobenzaprine Other (See Comments)    Tingly/prickly sensation. Other reaction(s): tingly/prickly sensation   Darvocet [Propoxyphene N-Acetaminophen] Nausea And Vomiting   Demerol [Meperidine] Nausea Only   Dexlansoprazole Swelling    Redness, swelling and peeling of both feet. Other reaction(s): foot pain   Diphedryl [Diphenhydramine] Other (See Comments)    Increased pulse/small amount ok   Doxycycline Hyclate Other (See Comments)    GI intolerance.   Doxycycline Hyclate     Other reaction(s): GI intolerance   Epinephrine Other (See Comments)  Breathing problems Other reaction(s): breathing problems/fainting   Erythromycin Other (See Comments)    GI intolerance. Other reaction(s): GI   Fish Oil     Other reaction(s): breathing problems/hives   Hyoscyamine     Other reaction(s): eye pain   Latex Other (See Comments)    Gloves ok.  Skin gets red from elastic in underwear and latex bandaides.   Meperidine Hcl     Other reaction(s): vomiting   Nitrofurantoin Diarrhea   Other     Other reaction(s): migraines Other reaction(s):  Unknown Other reaction(s): Unknown Other reaction(s): Unknown Other reaction(s): increased pulse, faint, diarrhea   Prednisone Other (See Comments)    Headache Other reaction(s): headache   Ra Diphedryl Allergy [Diphenhydramine Hcl]     Other reaction(s): increased pulse small dose okay   Sertraline Hcl Swelling and Other (See Comments)    Migraine Swelling of tongue/lip (09/2012) Other reaction(s): Unknown   Shellfish-Derived Products     Other reaction(s): Unknown   Sulfa Antibiotics Other (See Comments)    Increased pulse, fainting, diarrhea, thrush   Wellbutrin [Bupropion] Other (See Comments)    Headaches   Xylocaine [Lidocaine Hcl]     With epinephrine, given by dentist.  Speeded up heart rate and she passed out (occured twice, at dentist)   Xylocaine [Lidocaine]     Other reaction(s): Unknown   Biaxin [Clarithromycin] Rash    Started after completing 10 day course of 2000 mg /day, Lips swelling   Ibuprofen Other (See Comments)    Motrin ok with a GI effect. Other reaction(s): rash Motrin okay with a GI effect   ROS:  No fever, chills. Some flushing. She had the RF ablation, reports back pain is much better, but that she is now having pain in the RLE. She had a new medication prescribed for the RLE pain--hasn't picked it up from the pharmacy yet (cannot recall name, not in chart). Denies any new GI complaints, or other concerns today.   PHYSICAL EXAM:  BP 122/74   Pulse 72   Temp 98.2 F (36.8 C) (Tympanic)   Ht 4' 9.5" (1.461 m)   Wt 88 lb 9.6 oz (40.2 kg)   LMP  (LMP Unknown)   BMI 18.84 kg/m   Wt Readings from Last 3 Encounters:  11/23/22 88 lb 9.6 oz (40.2 kg)  11/09/22 88 lb 6.4 oz (40.1 kg)  11/08/22 89 lb (40.4 kg)   Thin, frail female, accompanied by her husband today.  They both seem to be in fairly good spirits today. Right great toe--there is a 1 cm circular raised area at the base of the distal phalanx of the right great toe, at lateral aspect. It  is soft, and not very thick.  There is bony prominence below this focal soft tissue, as well as medially in the same location of the toe. There is no erythema or warmth, and it is nontender to touch The tip of both great toes angle up slightly, putting more pressure on the bone rather than the pulp/soft tissue. She also has some tenderness on the left great toe, over the bony prominence medially.  Skin overlying this area is normal. Right 2nd toe overlaps the 3rd slightly. No edema, 2+ pulses.   ASSESSMENT/PLAN:  Great toe pain, right - discussed off-loading the pressure with either pads, or trying the new shoes she got. f/u with podiatrist if not improving.  Try your new shoes to see how the great toes feel. Consider a donut type of  pad, where you put the painful area in the center of the pad. I recommend following up with a podiatrist for re-evaluation if you have ongoing pain in the great toes.

## 2022-11-28 DIAGNOSIS — M5416 Radiculopathy, lumbar region: Secondary | ICD-10-CM | POA: Diagnosis not present

## 2022-11-28 DIAGNOSIS — Z681 Body mass index (BMI) 19 or less, adult: Secondary | ICD-10-CM | POA: Diagnosis not present

## 2022-11-28 DIAGNOSIS — M47816 Spondylosis without myelopathy or radiculopathy, lumbar region: Secondary | ICD-10-CM | POA: Diagnosis not present

## 2022-11-30 ENCOUNTER — Ambulatory Visit (INDEPENDENT_AMBULATORY_CARE_PROVIDER_SITE_OTHER): Payer: Medicare Other | Admitting: Psychiatry

## 2022-11-30 DIAGNOSIS — F411 Generalized anxiety disorder: Secondary | ICD-10-CM

## 2022-11-30 NOTE — Progress Notes (Signed)
[facility] Counselor/Therapist Progress Note  Patient ID: Tracy Malone, MRN: [id number],    Date: 11/30/2022  Time Spent: 45 minutes  Treatment Type: Individual Therapy  Virtual Visit via Telehealth Note: Telephone only as patient not able to do the video due to health reasons Connected with patient by a telemedicine/telehealth application, with their informed consent, and verified patient privacy and that I am speaking with the correct person using two identifiers. I discussed the limitations, risks, security and privacy concerns of performing psychotherapy and the availability of in person appointments. I also discussed with the patient that there may be a patient responsible charge related to this service. The patient expressed understanding and agreed to proceed. I discussed the treatment planning with the patient. The patient was provided an opportunity to ask questions and all were answered. The patient agreed with the plan and demonstrated an understanding of the instructions. The patient was advised to call  our office if  symptoms worsen or feel they are in a crisis state and need immediate contact.   Therapist Location: office Patient Location: home  Reported Symptoms: anxiety "is my strongest symptom" and some depression  Mental Status Exam:  Appearance:   N/a  telehealth      Behavior:  Appropriate  Motor:  Normal  Speech/Language:   N/a  Telehealth  Affect:  N/a   telehealth  Mood:  anxious and depressed  Thought process:  goal directed  Thought content:    Rumination  Sensory/Perceptual disturbances:    WNL  Orientation:  oriented to person, place, time/date, situation, day of week, month of year, year, and stated date of Nov 30, 2022  Attention:  Good  Concentration:  Good  Memory:  WNL  Fund of knowledge:   Good  Insight:    Good and Fair  Judgment:   Good  Impulse Control:  Good   Risk Assessment: Danger to Self:  No Self-injurious Behavior: No Danger  to Others: No Duty to Warn:no Physical Aggression / Violence:No  Access to Firearms a concern: No  Gang Involvement:No   Subjective:   Patient today reporting anxiety and some depression mostly related to her health issues, less sleep, her adult son out of state, and most recent back procedure. "Am miserable because of my back pain, my husband's medical issues, my son."  Very frustrated, some tearfulness, doubting herself and family at times, vented a lot of frustration and tears due to pain and her health concerns.  Reports she is staying on her medication and is happy with the sitter that comes several hours a day for patient and her husband.  Was able to focus on some positives before the end of our session, also adding that her faith is for her.  Continue to encourage patient on following all medical advice from her physicians and especially right now with pain management doctors.  Encouraged to take her medication as prescribed.  Denies any SI.  Did seem a little less stressed by end of our telehealth appointment today.  Interventions: Cognitive Behavioral Therapy and Ego-Supportive  Long term goal: Develop the ability to recognize, accept, and cope with feelings of anxiety and depression. Short term goal: Verbalize any unresolved grief issues that may be contributing to anxiety and depression. Strategy: Replace negative self-defeating self talk with verbalization of realistic and positive cognitive messages to lessen depression/anxiety and improve mood.   Diagnosis:   ICD-10-CM   1. Generalized anxiety disorder  F41.1  Plan: Patient today participating well in session although was in and out of tears for the first part of it as she is frustrated with her leg and back pain after having recent procedure to try and help alleviate her pain.  Did share about a conversation she had with her son out of state and that her daughter had visited with her new great grandchild who is now about 83  months old and she seemed to really enjoy that.  Has made some progress over time and needs to continue working with goal-directed behaviors aimed at further progress. Encouraged patient in her practice of more positive/self affirming/healing behaviors including: Staying on her medication as prescribed, not assuming worst-case scenarios, refer to the list that we worked on previously that patient says helps her to "get centered" whenever she is anxious and stressed, use the other list that we worked on as a reminder for the positives and not to focus only on the negatives, dissipating activities that she enjoys and is able to do physically and is okayed by her doctor, continue phone calls with friends, listening to music that she loves, positive self talk and self-care, occasionally getting out of her house as she is able, and recognize the strengths she shows when working with goal-directed behaviors to move in a direction that supports her improved emotional health and overall wellbeing.  Goal review and progress/challenges noted with patient.  Next appointment within 2-3 weeks.   Mathis Fare, LCSW

## 2022-12-04 ENCOUNTER — Encounter: Payer: Self-pay | Admitting: Family Medicine

## 2022-12-06 DIAGNOSIS — M47816 Spondylosis without myelopathy or radiculopathy, lumbar region: Secondary | ICD-10-CM | POA: Diagnosis not present

## 2022-12-06 DIAGNOSIS — Z681 Body mass index (BMI) 19 or less, adult: Secondary | ICD-10-CM | POA: Diagnosis not present

## 2022-12-12 ENCOUNTER — Other Ambulatory Visit: Payer: Medicare Other

## 2022-12-12 VITALS — BP 118/58 | HR 65 | Temp 97.6°F

## 2022-12-12 DIAGNOSIS — Z515 Encounter for palliative care: Secondary | ICD-10-CM

## 2022-12-12 NOTE — Progress Notes (Signed)
PATIENT NAME: Tracy Malone DOB: [birth year] MRN: [id number]  PRIMARY CARE PROVIDER: Joselyn Arrow, MD  RESPONSIBLE PARTY:  Acct ID - Guarantor Home Phone Work Phone Relationship Acct Type  [id number] FLOR, BIERNAT* [phone]  Self P/F     [address] [place], [place] [address]   Palliative Care Follow Up Encounter Note    Completed home visit. Husband Chrissie Noa also present.     HISTORY OF PRESENT ILLNESS:  89 y.o. year old female with Parkinson's Disease, osteopenia, lumbar compression fracture, aortic atherosclerosis, GERD, IBS, functional dyspepsia, hx of colitis and ischemic colitis, chronic lymphocytic thyroiditis, cystocele, early satiety, depression with anxiety, low back pain with sciatica, bilateral leg pain and hx of trochanteric bursitis    TODAY'S VISIT: pt has several complaints today, her L side and her feet hurt, she has nausea, she's very thirsty and she reports she was told years ago that she has Lupus but additional tests proved otherwise   Respiratory: denies SOB during this visit   Cardiac: no edema   Cognitive: alert and oriented   Appetite: pt reports she doesn't have an appetite but she eats small meals often; today the pt was eating an egg on a piece of toast; occasional nutritional snacks; she reports she "drinks a lot of water"; at least 8-8oz glasses   GI/GU: has a BM daily; sometimes takes colace with the Miralax to make BMs softer; no urinary issues   Mobility: ambulates independently   ADLs: able to groom, bathe and dress herself;  pays a housekeeper to do other ADLs   Sleeping Pattern: currently not able to sleep throughout the night but can go back to sleep on occasion; encouraged pt to sleep in the daytime if needed   Pain: 8/10 pain to her legs and the L side of her abdomen; pt has noted swelling to the L side   Wt: remains at 88.9 lbs       Goals of Care: To stay in the home as long as possible with her husband     Next Appt Scheduled  For: 01/23/23 @ 1pm      CODE STATUS: Full Code ADVANCED DIRECTIVES: N MOST FORM: Y PPS: 70%   PHYSICAL EXAM:   VITALS: Today's Vitals   12/12/22 1124  BP: (!) 118/58  Pulse: 65  Temp: 97.6 F (36.4 C)  TempSrc: Temporal  SpO2: 97%  PainSc: 8   PainLoc: Leg    LUNGS: clear to auscultation  CARDIAC: Cor RRR EXTREMITIES: dry,  SKIN: Skin color, texture, turgor normal. No rashes or lesions or ecchymoses - arm(s) left  NEURO: negative except for tremors and weakness       Kaylor Maiers Clementeen Graham, LPN

## 2022-12-19 DIAGNOSIS — M5416 Radiculopathy, lumbar region: Secondary | ICD-10-CM | POA: Diagnosis not present

## 2022-12-20 ENCOUNTER — Telehealth: Payer: Self-pay | Admitting: Neurology

## 2022-12-20 ENCOUNTER — Other Ambulatory Visit: Payer: Self-pay

## 2022-12-20 NOTE — Telephone Encounter (Signed)
Pt called wanting to discuss her Carbidopa-Levodopa ER (SINEMET CR) 25-100 MG tablet controlled release with RN or MD. Pt would not give any other information.

## 2022-12-20 NOTE — Telephone Encounter (Signed)
Called pt and she stated that her Carbidopa-Levodopa ER is giving her an upset stomach. Told pt to try eating first and then taking medication. Pt verbalized understanding.

## 2022-12-24 ENCOUNTER — Encounter: Payer: Self-pay | Admitting: Family Medicine

## 2022-12-25 ENCOUNTER — Ambulatory Visit (INDEPENDENT_AMBULATORY_CARE_PROVIDER_SITE_OTHER): Payer: Medicare Other | Admitting: Family Medicine

## 2022-12-25 ENCOUNTER — Encounter: Payer: Self-pay | Admitting: Family Medicine

## 2022-12-25 VITALS — BP 130/70 | HR 72 | Temp 98.9°F | Ht <= 58 in | Wt 86.2 lb

## 2022-12-25 DIAGNOSIS — K146 Glossodynia: Secondary | ICD-10-CM

## 2022-12-25 DIAGNOSIS — E46 Unspecified protein-calorie malnutrition: Secondary | ICD-10-CM | POA: Diagnosis not present

## 2022-12-25 DIAGNOSIS — B370 Candidal stomatitis: Secondary | ICD-10-CM

## 2022-12-25 DIAGNOSIS — R682 Dry mouth, unspecified: Secondary | ICD-10-CM

## 2022-12-25 MED ORDER — NYSTATIN 100000 UNIT/ML MT SUSP: 5.0000 mL | Freq: Four times a day (QID) | OROMUCOSAL | 0 refills | Status: DC

## 2022-12-25 NOTE — Progress Notes (Signed)
Chief Complaint  Patient presents with   Tracy Malone    Had cortisone shot last Tuesday and states that trush started over the weekend.    Epidural was done last Tuesday, for low back pain. She had ablation prior to that, and she has had improvement in some of her upper back pain.  Friday (3 days ago) she noticed her right side of her mouth was very dry. Drinking water helped. Last night she slept with a cloth with ice, to help with the dry mouth.  She noticed white on her tongue, and white spots on her lips and sides of the tongue. She complains that her mouth burns. She had discomfort at the tongue, roof of mouth. She is able to eat (but has no appetite).   Lips feel prickly.  Her mouth feels dry only at night. Denies any sore throat.  PMH, PSH, SH reviewed  Outpatient Encounter Medications as of 12/25/2022  Medication Sig Note   ALPRAZolam (XANAX) 0.5 MG tablet TAKE FOUR TABLETS BY MOUTH DAILY AS NEEDED FOR ANXIETY (Patient taking differently: Take 0.5 mg by mouth 2 (two) times daily as needed. TAKE FOUR TABLETS BY MOUTH DAILY AS NEEDED FOR ANXIETY) 12/25/2022: Last dose this am   Carbidopa-Levodopa ER (SINEMET CR) 25-100 MG tablet controlled release TAKE ONE TABLET THREE TIMES DAILY    FLUoxetine (PROZAC) 10 MG tablet Take 10 mg by mouth daily.    SYNTHROID 25 MCG tablet TAKE 1 TABLET MONDAY THROUGH SATURDAY, SKIP SUNDAY FOR HYPOTHYROIDISM    dicyclomine (BENTYL) 10 MG capsule TAKE 1 CAPSULE BY MOUTH EVERY 6 HOURS AS NEEDED (Patient not taking: Reported on 11/09/2022) 12/25/2022: As needed   docusate sodium (COLACE) 100 MG capsule Take 1 capsule (100 mg total) by mouth 2 (two) times daily as needed for mild constipation. (Patient not taking: Reported on 11/23/2022) 12/25/2022: As needed   polyethylene glycol (MIRALAX) 17 g packet Take 17 g by mouth daily as needed. (Patient not taking: Reported on 08/24/2022) 12/25/2022: As needed, has not needed lately   [DISCONTINUED] loperamide (IMODIUM) 2 MG capsule  Take 2 mg by mouth as needed for diarrhea or loose stools. (Patient not taking: Reported on 08/24/2022) 11/23/2022: As needed, has not needed     No facility-administered encounter medications on file as of 12/25/2022.   Allergies  Allergen Reactions   Iodinated Contrast Media Anaphylaxis   Iodine Anaphylaxis    IV and topical forms. Other reaction(s): Unknown   Levsin [Hyoscyamine Sulfate]     Vision problems/pt has glaucoma   Salmon [Fish Allergy] Hives and Shortness Of Breath   Shellfish Allergy Anaphylaxis   Tramadol Swelling   Remeron [Mirtazapine] Other (See Comments)    Cause blurred vision and red eyes, pt has glaucoma   Aspirin Other (See Comments)    Sever stomach pain due to ulcer scaring.   Bis Subcit-Metronid-Tetracyc Swelling    Tongue swelling. Face tingling Other reaction(s): Unknown   Cephalexin Hives    Other reaction(s): hives   Ciprofloxacin Diarrhea   Codeine Nausea And Vomiting   Cyclobenzaprine Other (See Comments)    Tingly/prickly sensation. Other reaction(s): tingly/prickly sensation   Darvocet [Propoxyphene N-Acetaminophen] Nausea And Vomiting   Demerol [Meperidine] Nausea Only   Dexlansoprazole Swelling    Redness, swelling and peeling of both feet. Other reaction(s): foot pain   Diphedryl [Diphenhydramine] Other (See Comments)    Increased pulse/small amount ok   Doxycycline Hyclate Other (See Comments)    GI intolerance.   Doxycycline Hyclate  Other reaction(s): GI intolerance   Epinephrine Other (See Comments)    Breathing problems Other reaction(s): breathing problems/fainting   Erythromycin Other (See Comments)    GI intolerance. Other reaction(s): GI   Fish Oil     Other reaction(s): breathing problems/hives   Hyoscyamine     Other reaction(s): eye pain   Latex Other (See Comments)    Gloves ok.  Skin gets red from elastic in underwear and latex bandaides.   Meperidine Hcl     Other reaction(s): vomiting   Nitrofurantoin Diarrhea    Other     Other reaction(s): migraines Other reaction(s): Unknown Other reaction(s): Unknown Other reaction(s): Unknown Other reaction(s): increased pulse, faint, diarrhea   Prednisone Other (See Comments)    Headache Other reaction(s): headache   Ra Diphedryl Allergy [Diphenhydramine Hcl]     Other reaction(s): increased pulse small dose okay   Sertraline Hcl Swelling and Other (See Comments)    Migraine Swelling of tongue/lip (09/2012) Other reaction(s): Unknown   Shellfish-Derived Products     Other reaction(s): Unknown   Sulfa Antibiotics Other (See Comments)    Increased pulse, fainting, diarrhea, thrush   Wellbutrin [Bupropion] Other (See Comments)    Headaches   Xylocaine [Lidocaine Hcl]     With epinephrine, given by dentist.  Speeded up heart rate and she passed out (occured twice, at dentist)   Xylocaine [Lidocaine]     Other reaction(s): Unknown   Biaxin [Clarithromycin] Rash    Started after completing 10 day course of 2000 mg /day, Lips swelling   Ibuprofen Other (See Comments)    Motrin ok with a GI effect. Other reaction(s): rash Motrin okay with a GI effect    ROS: No f/c, URI symptoms, cough, sore throat. Decreased appetite.  Husband states she is sometimes afraid to eat too much, worries about constipation. Denies constipation currently.   She denies nausea, vomiting. +depressed, tired. LBP; upper back pain has improved s/p ablation. Denies rash, bleeding.    PHYSICAL EXAM:  BP 130/70   Pulse 72   Temp 98.9 F (37.2 C) (Tympanic)   Ht 4' 9.5" (1.461 m)   Wt 86 lb 3.2 oz (39.1 kg)   LMP  (LMP Unknown)   BMI 18.33 kg/m   Wt Readings from Last 3 Encounters:  12/25/22 86 lb 3.2 oz (39.1 kg)  11/23/22 88 lb 9.6 oz (40.2 kg)  11/09/22 88 lb 6.4 oz (40.1 kg)   Patient was crying on my arrival to exam room.  She denies being in any pain or distress, just "tired" Tired of not feeling good. HEENT: conjunctiva and sclera are clear, EOMI. OP:  top  of the tongue is normal, no white areas.  On the left side of the tongue there are 2 small white spots. No white present elsewhere on the tongue, none on the lips or the buccal mucosa.  Palate is normal. Mucus membranes are dry and tacky (the tongue depressor stuck to it). Neck: no lymphadenopathy or mass Heart: regular rate and rhythm Lungs: clear bilaterally Extremities: no edema Psych: depressed/tearful.  Full range of affect Neuro: alert and oriented. Normal gait, grossly normal strength.   ASSESSMENT/PLAN:   Tongue pain - burning discomfort, in the setting of very dry mouth.  Encouraged adequate hydration, can continue Biotine. Tylenol prn  Thrush - only 2 small white spots present. Rest resolved w/o treatment, so doubt all was thrush. Will treat with nystatin. - Plan: nystatin (MYCOSTATIN) 100000 UNIT/ML suspension  Dry mouth - tacky  mucus membranes. Encouraged adequate oral intake of both fluids and food  Protein-calorie malnutrition, unspecified severity (HCC) - Weight is down some. Discussed importance of adequate nutrition to help with energy and healing. Encouraged high fiber diet with adequate protein, calories   Be sure to drink plenty of water. Use the biotine mouth spray/rinse as needed. There were just 2 small areas of white spots that could be thrush. . I'm not convinced that everything you saw was thrush, since most of the white spots went away on their own. Use the Nystatin as directed until better.  Work on eating lots of fiber in your diet (fruits, vegetables, or fiber supplement such as Benefiber, metamucil, etc). Try and get some activity--walking around (even if you can only do it in short bursts) can help your bowel stay active.

## 2022-12-25 NOTE — Patient Instructions (Signed)
  Be sure to drink plenty of water. Use the biotine mouth spray/rinse as needed. There were just 2 small areas of white spots that could be thrush. . I'm not convinced that everything you saw was thrush, since most of the white spots went away on their own. Use the Nystatin as directed until better.  Work on eating lots of fiber in your diet (fruits, vegetables, or fiber supplement such as Benefiber, metamucil, etc). Try and get some activity--walking around (even if you can only do it in short bursts) can help your bowel stay active.

## 2022-12-26 ENCOUNTER — Encounter: Payer: Self-pay | Admitting: Family Medicine

## 2022-12-26 ENCOUNTER — Ambulatory Visit (INDEPENDENT_AMBULATORY_CARE_PROVIDER_SITE_OTHER): Payer: Medicare Other | Admitting: Psychiatry

## 2022-12-26 DIAGNOSIS — F411 Generalized anxiety disorder: Secondary | ICD-10-CM | POA: Diagnosis not present

## 2022-12-26 NOTE — Progress Notes (Signed)
[facility] Counselor/Therapist Progress Note  Patient ID: Tracy Malone, MRN: [id number],    Date: 12/26/2022  Time Spent: 55 minutes   Treatment Type: Individual Therapy  Virtual Visit via Telehealth Note: Telephone session as patient is not able to use the MyChart video currently due to health situation Connected with patient by a telemedicine/telehealth application, with their informed consent, and verified patient privacy and that I am speaking with the correct person using two identifiers. I discussed the limitations, risks, security and privacy concerns of performing psychotherapy and the availability of in person appointments. I also discussed with the patient that there may be a patient responsible charge related to this service. The patient expressed understanding and agreed to proceed. I discussed the treatment planning with the patient. The patient was provided an opportunity to ask questions and all were answered. The patient agreed with the plan and demonstrated an understanding of the instructions. The patient was advised to call  our office if  symptoms worsen or feel they are in a crisis state and need immediate contact.   Therapist Location: office Patient Location: home  Reported Symptoms: anxiety, excessive worrying, overthinking, difficulty being motivated   Mental Status Exam:  Appearance:   N/a   telehealth      Behavior:  Appropriate and Sharing  Motor:  Uses cane or rollater in walking  Speech/Language:   Clear and Coherent  Affect:  N/a   telehealth  Mood:  anxious  Thought process:  goal directed  Thought content:    Rumination  Sensory/Perceptual disturbances:    WNL  Orientation:  oriented to person, place, time/date, situation, day of week, month of year, year, and stated date of December 26, 2022  Attention:  Good  Concentration:  Good and Fair  Memory:  WNL  Fund of knowledge:   Good  Insight:    Good and Fair  Judgment:   Good  Impulse Control:   Good   Risk Assessment: Danger to Self:  No Self-injurious Behavior: No Danger to Others: No Duty to Warn:no Physical Aggression / Violence:No  Access to Firearms a concern: No  Gang Involvement:No   Subjective: Patient today doing session by telehealth and reports anxiety, overthinking, excessive worrying especially about her son out of state and her health challenges. Difficulty feeling motivated but does "feel motivated to watch TV programs that has some humor because I hardly laugh anymore."  Processed her lack of laughter and desire to experience more humor in her life as she continues to deal with a lot of stress and physical issues. "And it's hard to get the past out of my way, some of it's gone." Shared more of her past today and states it does help her at times to talk things through "like today", but some other times are more difficult. Encouraged her with strategies we discussed today in looking more for the positives and paying closer attention to the days she reports are better for her and notice what she is doing differently those days such as focusing more on the positives, having more interaction with others, and participating in activities she enjoys including  watching shows on TV she enjoys and "makes me laugh" and listening to her music, and reading books re: her faith which is another positive for patient.  Did seem calmer by the end of session and less stressed.  Felt heard and wants to work further on trying to have a more positive outlook and not focus only  on the negatives  Interventions: Cognitive Behavioral Therapy, Solution-Oriented/Positive Psychology, and Ego-Supportive  Long term goal: Develop the ability to recognize, accept, and cope with feelings of anxiety and depression. Short term goal: Verbalize any unresolved grief issues that may be contributing to anxiety and depression. Strategy: Replace negative self-defeating self talk with verbalization of realistic  and positive cognitive messages to lessen depression/anxiety and improve mood.  Diagnosis:   ICD-10-CM   1. Generalized anxiety disorder  F41.1      Plan: Patient actively participating in session today via telehealth as she continues to work on her goals and is making progress even with the many obstacles that she faces with her health challenges.  Needs to continue working on her goals in order to keep moving in a forward direction.  Encouraged patient in practicing more positive/self affirming/healing behaviors including: Remaining on her medication as prescribed, not assuming worst-case scenarios, refer to the list that we worked on previously that patient says helps her to "get centered" when she is anxious and stressed, use the other list we worked on as a reminder for the positives and not to focus only on the negatives, participate in activities that she enjoys and is able to do physically and is okayed by her doctor, continue phone calls with friends and extended family, listening to music that she loves, positive self talk, occasionally getting out of her house as she is able physically, and realize the strength she shows working with goal-directed behaviors to move in a direction that supports her improved emotional health and outlook.  Goal review and progress/challenges noted with patient.  Next appointment within 2 to 3 weeks.   Mathis Fare, LCSW

## 2022-12-27 ENCOUNTER — Telehealth: Payer: Self-pay | Admitting: *Deleted

## 2022-12-27 NOTE — Telephone Encounter (Signed)
Spoke with patient and gave her all the information you provided. She said she will do the full teaspoon and thanked you.

## 2022-12-27 NOTE — Telephone Encounter (Signed)
Patient called and wanted to know if she can use 1/2 teas of the nystatin instead of 1 teaspoon QID. Also she said she forgot to mention to you when she was here that she has been having night sweats, she wants to know if it could be from whatever she currently has? She wakes up from the abdomen up each night soaked, her hair is wet and everything.

## 2022-12-27 NOTE — Telephone Encounter (Signed)
Her mouth was very dry.  I suspect that her having sweats is more likely to lead to her mouth being dry than the other way around.  A teaspoon isn't a lot of liquid. She needs to make sure it gets all over her tongue/cheeks/mouth and then spit it out. The larger volume (which is still small) is likely needed to ensure everywhere gets treated.   Not sure why she wants to try a smaller amount. No harm, just harder to ensure that the surfaces all get treated with that small amount.

## 2022-12-28 ENCOUNTER — Encounter: Payer: Self-pay | Admitting: Family Medicine

## 2022-12-31 ENCOUNTER — Encounter: Payer: Self-pay | Admitting: Family Medicine

## 2023-01-01 ENCOUNTER — Telehealth: Payer: Self-pay | Admitting: Adult Health

## 2023-01-01 ENCOUNTER — Encounter: Payer: Self-pay | Admitting: Family Medicine

## 2023-01-01 ENCOUNTER — Encounter: Payer: Self-pay | Admitting: Neurology

## 2023-01-01 NOTE — Telephone Encounter (Signed)
Pt LVM @ 11:08a.  She is asking if Almira Coaster can prescribe something for her for sleep.  She also asked if it's ok for her to take Melatonin.  She said she wakes up 3-4 times a night and is not getting enough rest.  Next appt 8/20

## 2023-01-01 NOTE — Telephone Encounter (Signed)
Patient reports trouble sleeping. She said with alprazolam she sleeps about 4 hours total, waking up every 2 hours. She is asking if she can take melatonin. Denies caffeine use.

## 2023-01-01 NOTE — Telephone Encounter (Signed)
Patient notified

## 2023-01-01 NOTE — Telephone Encounter (Signed)
That's fine

## 2023-01-03 ENCOUNTER — Encounter: Payer: Self-pay | Admitting: *Deleted

## 2023-01-03 NOTE — Progress Notes (Signed)
Chief Complaint  Patient presents with   Follow-up    Still having mouth pain. Lips are dry and throat is as well. Feels like this could be coming from her stomach, her stomach feels bloated. Feels very tired.    Patient presents to follow-up on her multiple concerns. See many MyChart messages (at least 7 since her last visit)  She was seen 12/25/22 with complaints of white spots on mouth/lips/tongue, and burning of her tongue. She was given nystatin to treat possible thrush (though only 2 small white areas were noted). She reports having white tongue in the morning, had been going away later in the day. 2 days ago she noticed it staying white all day. The burning of her mouth and tongue improved after she stopped taking the nystatin. She felt like the nystatin was making the mouth burning worse, felt like her mouth was on fire. She last used nystatin 2 days ago. Today her throat feel dry, raspy. Feels like she isn't producing enough saliva.  She tried mylanta--took it orally, helped her stomach.  Didn't rinse with it. Never tried the combo with benadryl that was suggested.  She had been eating well/better, but poorly for the last 2-3 days. She has recurrent L sided abdominal bloating and discomfort (this is a frequent complaint). Bowels--she passed some hard small stool yesterday, with a 2nd BM later that was more normal. She has h/o IBS.  Uses bentyl prn for spasms.  Husband states she last took this 4 nights ago (she states longer than that). Not currently having any cramping.  As always, she is asking if all of her problems are coming from the carbidopa/levadopa medication.  She is having ongoing pain in the R toe (prev evaluated, advised to f/u with podiatrist). She has an appointment scheduled with Dr. Al Corpus.  Hasn't been taking her Synthroid 30 mins prior to other meds. See last message.  States she takes it when she gets up (which may be longer in relation to when she eats). She  denies any changes to hair/skin/nails. Bowels as per above, ongoing depression, moods unchanged.  Some weight changes up and down (concern for loss, not gain). Last TSH was normal, and had been taking meds as she is now. Lab Results  Component Value Date   TSH 1.130 10/16/2022   She brought in a wrist BP monitor today. It was checked against ours, and was found to be reading low, not accurate.   PMH, PSH, SH reviewed  Outpatient Encounter Medications as of 01/04/2023  Medication Sig Note   ALPRAZolam (XANAX) 0.5 MG tablet TAKE FOUR TABLETS BY MOUTH DAILY AS NEEDED FOR ANXIETY (Patient taking differently: Take 0.5 mg by mouth 2 (two) times daily as needed. TAKE FOUR TABLETS BY MOUTH DAILY AS NEEDED FOR ANXIETY) 01/04/2023: Took 1/2 this am   Carbidopa-Levodopa ER (SINEMET CR) 25-100 MG tablet controlled release TAKE ONE TABLET THREE TIMES DAILY    Cholecalciferol (VITAMIN D) 50 MCG (2000 UT) CAPS Take 1 capsule by mouth daily.    docusate sodium (COLACE) 100 MG capsule Take 1 capsule (100 mg total) by mouth 2 (two) times daily as needed for mild constipation. 01/04/2023: Took one last night   b complex vitamins capsule Take 1 capsule by mouth daily. (Patient not taking: Reported on 01/04/2023) 01/04/2023: Did not take today   dicyclomine (BENTYL) 10 MG capsule TAKE 1 CAPSULE BY MOUTH EVERY 6 HOURS AS NEEDED (Patient not taking: Reported on 11/09/2022) 01/04/2023: As needed   FLUoxetine (  PROZAC) 10 MG tablet Take 10 mg by mouth daily. (Patient not taking: Reported on 01/04/2023)    nystatin (MYCOSTATIN) 100000 UNIT/ML suspension Take 5 mLs (500,000 Units total) by mouth 4 (four) times daily. (Patient not taking: Reported on 01/04/2023) 01/04/2023: Last used Tuesday   polyethylene glycol (MIRALAX) 17 g packet Take 17 g by mouth daily as needed. (Patient not taking: Reported on 08/24/2022) 01/04/2023: Last dose was on Tuesday   SYNTHROID 25 MCG tablet TAKE 1 TABLET MONDAY THROUGH SATURDAY, SKIP SUNDAY FOR  HYPOTHYROIDISM (Patient not taking: Reported on 01/04/2023) 01/04/2023: Did not take today   No facility-administered encounter medications on file as of 01/04/2023.   Allergies  Allergen Reactions   Iodinated Contrast Media Anaphylaxis   Iodine Anaphylaxis    IV and topical forms. Other reaction(s): Unknown   Levsin [Hyoscyamine Sulfate]     Vision problems/pt has glaucoma   Salmon [Fish Allergy] Hives and Shortness Of Breath   Shellfish Allergy Anaphylaxis   Tramadol Swelling   Remeron [Mirtazapine] Other (See Comments)    Cause blurred vision and red eyes, pt has glaucoma   Aspirin Other (See Comments)    Sever stomach pain due to ulcer scaring.   Bis Subcit-Metronid-Tetracyc Swelling    Tongue swelling. Face tingling Other reaction(s): Unknown   Cephalexin Hives    Other reaction(s): hives   Ciprofloxacin Diarrhea   Codeine Nausea And Vomiting   Cyclobenzaprine Other (See Comments)    Tingly/prickly sensation. Other reaction(s): tingly/prickly sensation   Darvocet [Propoxyphene N-Acetaminophen] Nausea And Vomiting   Demerol [Meperidine] Nausea Only   Dexlansoprazole Swelling    Redness, swelling and peeling of both feet. Other reaction(s): foot pain   Diphedryl [Diphenhydramine] Other (See Comments)    Increased pulse/small amount ok   Doxycycline Hyclate Other (See Comments)    GI intolerance.   Doxycycline Hyclate     Other reaction(s): GI intolerance   Epinephrine Other (See Comments)    Breathing problems Other reaction(s): breathing problems/fainting   Erythromycin Other (See Comments)    GI intolerance. Other reaction(s): GI   Fish Oil     Other reaction(s): breathing problems/hives   Hyoscyamine     Other reaction(s): eye pain   Latex Other (See Comments)    Gloves ok.  Skin gets red from elastic in underwear and latex bandaides.   Meperidine Hcl     Other reaction(s): vomiting   Nitrofurantoin Diarrhea   Other     Other reaction(s): migraines Other  reaction(s): Unknown Other reaction(s): Unknown Other reaction(s): Unknown Other reaction(s): increased pulse, faint, diarrhea   Prednisone Other (See Comments)    Headache Other reaction(s): headache   Ra Diphedryl Allergy [Diphenhydramine Hcl]     Other reaction(s): increased pulse small dose okay   Sertraline Hcl Swelling and Other (See Comments)    Migraine Swelling of tongue/lip (09/2012) Other reaction(s): Unknown   Shellfish-Derived Products     Other reaction(s): Unknown   Sulfa Antibiotics Other (See Comments)    Increased pulse, fainting, diarrhea, thrush   Wellbutrin [Bupropion] Other (See Comments)    Headaches   Xylocaine [Lidocaine Hcl]     With epinephrine, given by dentist.  Speeded up heart rate and she passed out (occured twice, at dentist)   Xylocaine [Lidocaine]     Other reaction(s): Unknown   Biaxin [Clarithromycin] Rash    Started after completing 10 day course of 2000 mg /day, Lips swelling   Ibuprofen Other (See Comments)    Motrin ok with a  GI effect. Other reaction(s): rash Motrin okay with a GI effect    ROS: No blood or mucus in stool. No fever, some sweats. Throat feels dry Has some crusting at her nose in the morning. Throat and tongue are worse in the morning. Tongue feels swollen now. Bloating, gassy, intermittent constipation per HPI. Appetite improved.    PHYSICAL EXAM:  BP 130/70   Pulse 72   Temp 97.8 F (36.6 C) (Tympanic)   Ht 4' 9.5" (1.461 m)   Wt 88 lb 3.2 oz (40 kg)   LMP  (LMP Unknown)   BMI 18.76 kg/m   Wt Readings from Last 3 Encounters:  01/04/23 88 lb 3.2 oz (40 kg)  12/25/22 86 lb 3.2 oz (39.1 kg)  11/23/22 88 lb 9.6 oz (40.2 kg)   Thin, frail female, in no distress.  HEENT: conjunctiva and sclera are clear, EOMI. TM and EAC's normal. Nasal mucosa is normal, with some yellow crusting noted on the left. OP--MM are a little moister than last time, still tacky.  Tongue does appear more white across the top  today, doesn't scrape off, nontender, doesn't appear swollen. Normal mucosal membranes elsewhere. No white spots.   Neck: no lymphadenopathy or mass Heart: regular rate and rhythm Lungs: clear bilaterally Extremities: no edema Psych: depressed, but  not tearful today. Normal hygiene, grooming, eye contact, speech. Neuro: alert and oriented.  Gait is slow/cautious, but steady.  Cranial nerves grossly intact.    ASSESSMENT/PLAN:  Tongue pain - improved since stopping Nystatin (though has been more white).  Suspect dry mouth, PND contrib to her sx, not thrush. Supportive measures discussed  Throat pain - based on nasal exam, likely has PND contrib to sx. Discussed antihistamine, flonase, sinus rinses, mucinex  Constipation, unspecified constipation type - discussed importance of high fiber diet, which fibers she might tolerate better.  Stay hydrated, stay active.  Acquired hypothyroidism - clinically euthyroid, last TSH normal. Pt reassured, and reviewed rec for timing/taking synthroid.  Irritable bowel syndrome, unspecified type - reviewed bentyl; side effects can be dry mouth, exacerbating mouth complaints. Simethicone prn  Depression with anxiety - likely contributing to somatic complaints. Under care of psych. Reassured pt re her complaints. Reviewed all her meds and SEs   Consider magic mouthwash if worse  I spent 43 minutes dedicated to the care of this patient, including pre-visit review of records, face to face time, post-visit ordering of testing and documentation.   Wrist monitor was not accurate today.  It reads lower than what we got when we checked your blood pressure.  Your last thyroid test was normal taking your medications the way you have been. Thyroid medication should be taken first thing on an empty stomach. Wait at least 30 minutes before eating and other medications. Wait 4 hours prior to vitamins, calcium etc (taking those with lunch or later in the day is  fine).  The bentyl (dicyclomine) can definitely cause dry mouth (as is also a side effect of the carbidopa/levodopa).  Just be aware of this--obviously the bentyl helps you when having stomach spasms. Recognize the connections to worsening dry mouth when you take it.  You seem to be having some constipation and gas which can contribute to your abdominal bloating and discomfort. You can try simethicone as needed for gas pain and bloating (as well as positional changes--fetal position, hugging your knees). Be sure to stay well hydrated, eat enough food (don't skip meals). Be sure to get a high fiber diet. Have  more regular pruines (or prune juice).  If your tongue remains sore/burning/swollen, you can try swishing 1-2 teaspoons of mylanta around your mouth. (You can take it like a normal dose and swallow it if your stomach needs it, but for your tongue, you can just swish it around your mouth and spit it out). Same thing with liquid benadryl. This can sometimes help with a sore/swollen tongue.  DO NOT SWALLOW the benadryl, as it may make you sleep.  Continue to use Biotene (spray, lozenges, mouthwash) to help with your dry mouth.  You did have some crusting in the nose.  This suggests that perhaps postnasal drainage may be contributing to some of your throat issues (at night and in the morning). I recommend using a nasal saline spray a few times per day to see if this helps. If you have worsening congestion or sinus pain, you may want to use Flonase daily in addition.

## 2023-01-04 ENCOUNTER — Encounter: Payer: Self-pay | Admitting: Family Medicine

## 2023-01-04 ENCOUNTER — Ambulatory Visit (INDEPENDENT_AMBULATORY_CARE_PROVIDER_SITE_OTHER): Payer: Medicare Other | Admitting: Family Medicine

## 2023-01-04 VITALS — BP 130/70 | HR 72 | Temp 97.8°F | Ht <= 58 in | Wt 88.2 lb

## 2023-01-04 DIAGNOSIS — K5900 Constipation, unspecified: Secondary | ICD-10-CM | POA: Diagnosis not present

## 2023-01-04 DIAGNOSIS — F418 Other specified anxiety disorders: Secondary | ICD-10-CM

## 2023-01-04 DIAGNOSIS — K146 Glossodynia: Secondary | ICD-10-CM | POA: Diagnosis not present

## 2023-01-04 DIAGNOSIS — E039 Hypothyroidism, unspecified: Secondary | ICD-10-CM | POA: Diagnosis not present

## 2023-01-04 DIAGNOSIS — R070 Pain in throat: Secondary | ICD-10-CM

## 2023-01-04 DIAGNOSIS — K589 Irritable bowel syndrome without diarrhea: Secondary | ICD-10-CM

## 2023-01-04 NOTE — Patient Instructions (Addendum)
Wrist monitor was not accurate today.  It reads lower than what we got when we checked your blood pressure.   Your last thyroid test was normal taking your medications the way you have been. Thyroid medication should be taken first thing on an empty stomach. Wait at least 30 minutes before eating and other medications. Wait 4 hours prior to vitamins, calcium etc (taking those with lunch or later in the day is fine).  The bentyl (dicyclomine) can definitely cause dry mouth (as is also a side effect of the carbidopa/levodopa).  Just be aware of this--obviously the bentyl helps you when having stomach spasms. Recognize the connections to worsening dry mouth when you take it.  You seem to be having some constipation and gas which can contribute to your abdominal bloating and discomfort. You can try simethicone as needed for gas pain and bloating (as well as positional changes--fetal position, hugging your knees). Be sure to stay well hydrated, eat enough food (don't skip meals). Be sure to get a high fiber diet. Have more regular pruines (or prune juice).  If your tongue remains sore/burning/swollen, you can try swishing 1-2 teaspoons of mylanta around your mouth. (You can take it like a normal dose and swallow it if your stomach needs it, but for your tongue, you can just swish it around your mouth and spit it out). Same thing with liquid benadryl. This can sometimes help with a sore/swollen tongue.  DO NOT SWALLOW the benadryl, as it may make you sleep.  Continue to use Biotene (spray, lozenges, mouthwash) to help with your dry mouth.  You did have some crusting in the nose.  This suggests that perhaps postnasal drainage may be contributing to some of your throat issues (at night and in the morning). I recommend using a nasal saline spray a few times per day to see if this helps. If you have worsening congestion or sinus pain, you may want to use Flonase daily in addition.

## 2023-01-05 ENCOUNTER — Encounter: Payer: Self-pay | Admitting: Family Medicine

## 2023-01-09 ENCOUNTER — Ambulatory Visit (INDEPENDENT_AMBULATORY_CARE_PROVIDER_SITE_OTHER): Payer: Medicare Other | Admitting: Podiatry

## 2023-01-09 DIAGNOSIS — M79675 Pain in left toe(s): Secondary | ICD-10-CM

## 2023-01-09 DIAGNOSIS — M79674 Pain in right toe(s): Secondary | ICD-10-CM | POA: Diagnosis not present

## 2023-01-09 DIAGNOSIS — B351 Tinea unguium: Secondary | ICD-10-CM

## 2023-01-09 NOTE — Progress Notes (Signed)
She presents today with her husband she is wheelchair-bound states that she has painful toenails but she also wants to talk about her big toe on her right foot.  States that she stepped on a jet in a swimming pool and that is when her toe started hurting.  She states has been about a year and 1/2 to 2 years ago now.  Objective: Vital signs are stable oriented x 3.  Pulses palpable.  Neurologic sensorium is intact she does have severe extensors at the level of the interphalangeal joint with pain on palpation at the plantar aspect of the distal proximal phalanx.  She has no ability to plantarflex the interphalangeal joint at all.  There is no callus present and there is no skin breakdown.  Her toenails otherwise are thick yellow dystrophic and clinically mycotic.  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  Pain in limb secondary to onychomycosis.  Plan: Placed silicone pad about the hallux.  Also debrided nails 1 through 5 bilateral.

## 2023-01-10 ENCOUNTER — Other Ambulatory Visit: Payer: Self-pay | Admitting: Adult Health

## 2023-01-10 ENCOUNTER — Telehealth: Payer: Self-pay | Admitting: Adult Health

## 2023-01-10 DIAGNOSIS — F411 Generalized anxiety disorder: Secondary | ICD-10-CM

## 2023-01-10 MED ORDER — ALPRAZOLAM 0.5 MG PO TABS
ORAL_TABLET | ORAL | 2 refills | Status: AC
Start: 2023-01-10 — End: ?

## 2023-01-10 NOTE — Telephone Encounter (Signed)
Script sent  

## 2023-01-10 NOTE — Telephone Encounter (Signed)
Pt called asking for a refill on her xanax. Pharmacy is [facility] [address]

## 2023-01-11 ENCOUNTER — Ambulatory Visit (INDEPENDENT_AMBULATORY_CARE_PROVIDER_SITE_OTHER): Payer: Medicare Other | Admitting: Psychiatry

## 2023-01-11 DIAGNOSIS — F411 Generalized anxiety disorder: Secondary | ICD-10-CM | POA: Diagnosis not present

## 2023-01-11 NOTE — Progress Notes (Signed)
[facility] Counselor/Therapist Progress Note  Patient ID: SHARE ETTEN, MRN: [id number],    Date: 01/11/2023  Time Spent: 50 minutes   Treatment Type: Individual Therapy  Virtual Visit via Telehealth Note: Telephone only, patient sick and cannot do the video Connected with patient by a telemedicine/telehealth application, with their informed consent, and verified patient privacy and that I am speaking with the correct person using two identifiers. I discussed the limitations, risks, security and privacy concerns of performing psychotherapy and the availability of in person appointments. I also discussed with the patient that there may be a patient responsible charge related to this service. The patient expressed understanding and agreed to proceed. I discussed the treatment planning with the patient. The patient was provided an opportunity to ask questions and all were answered. The patient agreed with the plan and demonstrated an understanding of the instructions. The patient was advised to call  our office if  symptoms worsen or feel they are in a crisis state and need immediate contact.   Therapist Location: office Patient Location: home  Reported Symptoms: "I'm just feeling awful, physically, mentally, all of it." , tearful, having some physical issues that are being addressed  Mental Status Exam:  Appearance:   N/a  telehealth      Behavior:  Appropriate and Sharing  Motor:  Uses rollator when walking  Speech/Language:   Clear and Coherent  Affect:  N/a  telehealth  Mood:  anxious, depressed, and irritable  Thought process:  normal  Thought content:    Rumination  Sensory/Perceptual disturbances:    WNL  Orientation:  oriented to person, place, time/date, situation, day of week, month of year, year, and stated date of June 20,   Attention:  Fair  Concentration:  Fair  Memory:  Some short term memory issues and states her Dr is aware  Fund of knowledge:   Good   Insight:    Good and Fair  Judgment:   Good  Impulse Control:  Good   Risk Assessment: Danger to Self:  No Self-injurious Behavior: No Danger to Others: No Duty to Warn:no Physical Aggression / Violence:No  Access to Firearms a concern: No  Gang Involvement:No   Subjective:  Patient today having session by telehealth, tearful, anxious, depression, and irritable, mostly related to her ongoing physical issues and concerns re: adult son who has not yet been released to travel out of state and visit parents. (No all details included in this note due to sensitive info and patient's privacy needs.)  Continues to struggle with overthinking, excessive worrying , anxiety, some depression, and irritability. Does better when talking issues through in sessions but is very difficult for her to hang onto positives.  Did eventually engage in conversation about some things that have helped previously and how to hold onto some of those things even after her appointment has ended.  Discussed thought patterns that are more helpful, refraining from assuming worst-case scenarios, allowing other people to be supportive, getting outside some as she is able with support, using the music that she loves, and watch some of the TV programs that she has said brings her humor which helps her feel better.  Does show a little progress with some of her previously unresolved grief but still needing to work on some of her self talk that is self-defeating as we noted in session today.  Denies any SI.  Does enjoy the company of 2 of her caregivers that are in and out  of the home frequently.  By end of session was definitely more calm, not tearful, and expressed some hope.  Interventions: Cognitive Behavioral Therapy and Ego-Supportive  Long term goal: Develop the ability to recognize, accept, and cope with feelings of anxiety and depression. Short term goal: Verbalize any unresolved grief issues that may be contributing to anxiety  and depression. Strategy: Replace negative self-defeating self talk with verbalization of realistic and positive cognitive messages to lessen depression/anxiety and improve mood.  Diagnosis:   ICD-10-CM   1. Generalized anxiety disorder  F41.1      Plan:   Patient showing good participation in session today via telehealth sharing a lot of her anxious, irritable, and depressive feelings but able to eventually reach a better place of more hope and determination as noted above. Patient is making progress and needs to continue working with goal-directed behaviors and the various people that are involved in her life helping, in order to move in a positive direction. Encouraged patient in her practice of more positive/self affirming/healing behaviors including: Remaining on her medication as prescribed, not assuming worst-case scenarios, refer to the list that we worked on previously that patient says helps her to "get centered" when she is anxious and stressed, use the other list we worked on as a reminder for the positives and not to focus only on the negatives, participate in activities that she enjoys and is able to do physically and is approved by her doctor, continue phone calls with friends and extended family, listening to music that she loves, positive self talk, occasionally getting out of her house as she is able physically, and realize the strengths she shows working with goal-directed behaviors to move in a direction that supports her improved emotional health and overall wellbeing.  Goal review and progress/challenges noted with patient.  Next appointment within 3 weeks.   Mathis Fare, LCSW

## 2023-01-12 DIAGNOSIS — J342 Deviated nasal septum: Secondary | ICD-10-CM | POA: Diagnosis not present

## 2023-01-12 DIAGNOSIS — J3489 Other specified disorders of nose and nasal sinuses: Secondary | ICD-10-CM | POA: Diagnosis not present

## 2023-01-17 ENCOUNTER — Encounter: Payer: Self-pay | Admitting: Neurology

## 2023-01-18 NOTE — Telephone Encounter (Signed)
Pt called needing to speak to the RN due to confusion with medications being told to take. Pt also wants to know if there will be any interactions with her other medications. Please advise.

## 2023-01-22 ENCOUNTER — Encounter (INDEPENDENT_AMBULATORY_CARE_PROVIDER_SITE_OTHER): Payer: Self-pay

## 2023-01-22 ENCOUNTER — Encounter: Payer: Self-pay | Admitting: Neurology

## 2023-01-22 ENCOUNTER — Other Ambulatory Visit: Payer: Self-pay | Admitting: Neurology

## 2023-01-22 MED ORDER — RASAGILINE MESYLATE 0.5 MG PO TABS: 0.5000 mg | ORAL_TABLET | Freq: Every day | ORAL | 11 refills | Status: DC

## 2023-01-23 ENCOUNTER — Other Ambulatory Visit: Payer: Medicare Other

## 2023-01-26 DIAGNOSIS — R630 Anorexia: Secondary | ICD-10-CM | POA: Diagnosis not present

## 2023-01-26 DIAGNOSIS — Z8781 Personal history of (healed) traumatic fracture: Secondary | ICD-10-CM | POA: Diagnosis not present

## 2023-01-26 DIAGNOSIS — E063 Autoimmune thyroiditis: Secondary | ICD-10-CM | POA: Diagnosis not present

## 2023-01-26 DIAGNOSIS — L659 Nonscarring hair loss, unspecified: Secondary | ICD-10-CM | POA: Diagnosis not present

## 2023-01-26 DIAGNOSIS — M810 Age-related osteoporosis without current pathological fracture: Secondary | ICD-10-CM | POA: Diagnosis not present

## 2023-01-26 DIAGNOSIS — R634 Abnormal weight loss: Secondary | ICD-10-CM | POA: Diagnosis not present

## 2023-01-26 DIAGNOSIS — Z131 Encounter for screening for diabetes mellitus: Secondary | ICD-10-CM | POA: Diagnosis not present

## 2023-01-26 DIAGNOSIS — Z681 Body mass index (BMI) 19 or less, adult: Secondary | ICD-10-CM | POA: Diagnosis not present

## 2023-01-26 DIAGNOSIS — E039 Hypothyroidism, unspecified: Secondary | ICD-10-CM | POA: Diagnosis not present

## 2023-01-29 DIAGNOSIS — R634 Abnormal weight loss: Secondary | ICD-10-CM | POA: Diagnosis not present

## 2023-01-29 DIAGNOSIS — R630 Anorexia: Secondary | ICD-10-CM | POA: Diagnosis not present

## 2023-02-01 ENCOUNTER — Ambulatory Visit (INDEPENDENT_AMBULATORY_CARE_PROVIDER_SITE_OTHER): Payer: Medicare Other | Admitting: Psychiatry

## 2023-02-01 DIAGNOSIS — F411 Generalized anxiety disorder: Secondary | ICD-10-CM | POA: Diagnosis not present

## 2023-02-01 NOTE — Progress Notes (Signed)
[facility] Counselor/Therapist Progress Note  Patient ID: Tracy Malone, MRN: [id number],    Date: 02/01/2023  Time Spent: 48 minutes   Treatment Type: Individual Therapy  Virtual Visit via Telehealth Note: Telephone only as patient is not able to do the video  Connected with patient by a telemedicine/telehealth application, with their informed consent, and verified patient privacy and that I am speaking with the correct person using two identifiers. I discussed the limitations, risks, security and privacy concerns of performing psychotherapy and the availability of in person appointments. I also discussed with the patient that there may be a patient responsible charge related to this service. The patient expressed understanding and agreed to proceed. I discussed the treatment planning with the patient. The patient was provided an opportunity to ask questions and all were answered. The patient agreed with the plan and demonstrated an understanding of the instructions. The patient was advised to call  our office if  symptoms worsen or feel they are in a crisis state and need immediate contact.   Therapist Location: office Patient Location: home  Reported Symptoms: depression, anxiety, stressed, some anger, denies any SI  Mental Status Exam:  Appearance:   N/a  telehealth      Behavior:  Sharing  Motor:  Affected by her medical issues currently  Speech/Language:   Clear and Coherent  Affect:  N/a  telehealth  Mood:  angry, anxious, depressed, and irritable  Thought process:  goal directed  Thought content:    Rumination  Sensory/Perceptual disturbances:    WNL  Orientation:  oriented to person, place, time/date, situation, day of week, month of year, year, and stated date of February 01, 2023  Attention:  Good  Concentration:  Good  Memory:  WNL  Fund of knowledge:   Good  Insight:    Good and Fair  Judgment:   Good  Impulse Control:  Good   Risk Assessment: Danger to Self:   No Self-injurious Behavior: No Danger to Others: No Duty to Warn:no Physical Aggression / Violence:No  Access to Firearms a concern: No  Gang Involvement:No   Subjective: Patient today reports mood is "down" and having more medical issues, "I have thyroid issue" and I have another Dr appt tomorrow to get checked further." Still having back and side pain and big toe. Reports anxiety, depression, anger, and frustration related to medical, family, son in [place], but mostly regarding her own health concerns. Today needed session to vent her frustration/anxiousness/anger, feel heard, and process her concerns about her health and her family. Did really well in sharing her concerns and emotions related to her areas of distress and did seem more leveled out emotionally and stated she felt some better after sharing.  Interventions: Cognitive Behavioral Therapy and Ego-Supportive  Long term goal: Develop the ability to recognize, accept, and cope with feelings of anxiety and depression. Short term goal: Verbalize any unresolved grief issues that may be contributing to anxiety and depression. Strategy: Replace negative self-defeating self talk with verbalization of realistic and positive cognitive messages to lessen depression/anxiety and improve mood.  Diagnosis:   ICD-10-CM   1. Generalized anxiety disorder  F41.1      Plan: Patient  today actively participating in session, very frustrated, anxious, depressed, and angry re: ongoing medical issues and increase in an issue with her big toe, in addition to some family stressors. Vented a lot of her thoughts and feelings today and was calmer and sounding less irritable and less  anxious by session end. Encouraged patient to practice more positive/self affirming behaviors including: Staying on her prescribed medication, refrain from assuming worst-case scenarios, use the "getting centered"list that we worked on in previous session to help during her more  anxious and frustrating times, remember her positives and not dwell on negatives, phone calls with extended family, listening to her music that she loves, positive self talk, occasionally get out of her house as she is able, and recognize the strength she shows working with goal-directed behaviors to move in a direction that supports her improved emotional health and outlook.  Goal review and progress/challenges noted with patient.  Next appointment within 2 weeks.   Mathis Fare, LCSW

## 2023-02-02 DIAGNOSIS — E2740 Unspecified adrenocortical insufficiency: Secondary | ICD-10-CM | POA: Diagnosis not present

## 2023-02-05 DIAGNOSIS — R634 Abnormal weight loss: Secondary | ICD-10-CM | POA: Diagnosis not present

## 2023-02-05 DIAGNOSIS — Z8781 Personal history of (healed) traumatic fracture: Secondary | ICD-10-CM | POA: Diagnosis not present

## 2023-02-05 DIAGNOSIS — E039 Hypothyroidism, unspecified: Secondary | ICD-10-CM | POA: Diagnosis not present

## 2023-02-05 DIAGNOSIS — M810 Age-related osteoporosis without current pathological fracture: Secondary | ICD-10-CM | POA: Diagnosis not present

## 2023-02-05 DIAGNOSIS — E063 Autoimmune thyroiditis: Secondary | ICD-10-CM | POA: Diagnosis not present

## 2023-02-05 LAB — HEMOGLOBIN A1C: Hemoglobin A1C: 5.4

## 2023-02-05 LAB — TSH: TSH: 0.89 (ref 0.41–5.90)

## 2023-02-06 DIAGNOSIS — Z01419 Encounter for gynecological examination (general) (routine) without abnormal findings: Secondary | ICD-10-CM | POA: Diagnosis not present

## 2023-02-06 DIAGNOSIS — Z681 Body mass index (BMI) 19 or less, adult: Secondary | ICD-10-CM | POA: Diagnosis not present

## 2023-02-07 ENCOUNTER — Encounter: Payer: Self-pay | Admitting: *Deleted

## 2023-02-07 ENCOUNTER — Other Ambulatory Visit (HOSPITAL_BASED_OUTPATIENT_CLINIC_OR_DEPARTMENT_OTHER): Payer: Self-pay | Admitting: Neurosurgery

## 2023-02-07 DIAGNOSIS — M5416 Radiculopathy, lumbar region: Secondary | ICD-10-CM

## 2023-02-07 DIAGNOSIS — Z681 Body mass index (BMI) 19 or less, adult: Secondary | ICD-10-CM | POA: Diagnosis not present

## 2023-02-09 ENCOUNTER — Encounter: Payer: Self-pay | Admitting: Adult Health

## 2023-02-09 ENCOUNTER — Ambulatory Visit (INDEPENDENT_AMBULATORY_CARE_PROVIDER_SITE_OTHER): Payer: Medicare Other | Admitting: Adult Health

## 2023-02-09 DIAGNOSIS — F410 Panic disorder [episodic paroxysmal anxiety] without agoraphobia: Secondary | ICD-10-CM

## 2023-02-09 DIAGNOSIS — F411 Generalized anxiety disorder: Secondary | ICD-10-CM | POA: Diagnosis not present

## 2023-02-09 DIAGNOSIS — G4700 Insomnia, unspecified: Secondary | ICD-10-CM | POA: Diagnosis not present

## 2023-02-09 DIAGNOSIS — F331 Major depressive disorder, recurrent, moderate: Secondary | ICD-10-CM | POA: Diagnosis not present

## 2023-02-09 NOTE — Progress Notes (Signed)
Tracy Malone [id number] [birth year] 89 y.o.  Virtual Visit via Telephone Note  I connected with pt on 02/09/23 at  3:20 PM EDT by telephone and verified that I am speaking with the correct person using two identifiers.   I discussed the limitations, risks, security and privacy concerns of performing an evaluation and management service by telephone and the availability of in person appointments. I also discussed with the patient that there may be a patient responsible charge related to this service. The patient expressed understanding and agreed to proceed.   I discussed the assessment and treatment plan with the patient. The patient was provided an opportunity to ask questions and all were answered. The patient agreed with the plan and demonstrated an understanding of the instructions.   The patient was advised to call back or seek an in-person evaluation if the symptoms worsen or if the condition fails to improve as anticipated.  I provided 25 minutes of non-face-to-face time during this encounter.  The patient was located at home.  The provider was located at [facility].   Dorothyann Gibbs, NP   Subjective:   Patient ID:  Tracy Malone is a 89 y.o. (DOB [birth year]) female.  Chief Complaint: No chief complaint on file.   HPI Tracy Malone presents for follow-up of MDD, GAD, panic attacks and insomnia.  Accompanied by husband.  Describes mood today as "about the same". Pleasant. Tearful at times. Mood symptoms - reports depression, anxiety and irritable. Reports worry and rumination. Denies recent panic attacks. Feels like Xanax is helpful as needed. Varying interest and motivation. Taking medications as prescribed. Seeing therapist - Rockne Menghini. Energy levels lower. Active, does not have a regular exercise routine.  Does not enjoy usual interests and activities. Married. Lives with husband - 2 grown children. Spending time with family. Appetite decreased. Weight  loss - 87.5 pounds. Sleeping difficulties. Averages 4 to 5 hours. Reports some daytime napping. Focus and concentration stable. Completing tasks. Managing aspects of household. Retired.  Denies SI or HI.  Denies AH or VH. Denies self harm. Denies substance use.  Previous medication trials: Multiple medication trials   Review of Systems:  Review of Systems  Musculoskeletal:  Negative for gait problem.  Neurological:  Negative for tremors.  Psychiatric/Behavioral:         Please refer to HPI    Medications: I have reviewed the patient's current medications.  Current Outpatient Medications  Medication Sig Dispense Refill   ALPRAZolam (XANAX) 0.5 MG tablet TAKE FOUR TABLETS BY MOUTH DAILY AS NEEDED FOR ANXIETY 120 tablet 2   b complex vitamins capsule Take 1 capsule by mouth daily. (Patient not taking: Reported on 01/04/2023)     Carbidopa-Levodopa ER (SINEMET CR) 25-100 MG tablet controlled release TAKE ONE TABLET THREE TIMES DAILY 270 tablet 3   Cholecalciferol (VITAMIN D) 50 MCG (2000 UT) CAPS Take 1 capsule by mouth daily.     dicyclomine (BENTYL) 10 MG capsule TAKE 1 CAPSULE BY MOUTH EVERY 6 HOURS AS NEEDED (Patient not taking: Reported on 11/09/2022) 30 capsule 6   docusate sodium (COLACE) 100 MG capsule Take 1 capsule (100 mg total) by mouth 2 (two) times daily as needed for mild constipation. 60 capsule 0   nystatin (MYCOSTATIN) 100000 UNIT/ML suspension Take 5 mLs (500,000 Units total) by mouth 4 (four) times daily. (Patient not taking: Reported on 01/04/2023) 120 mL 0   polyethylene glycol (MIRALAX) 17 g packet Take 17 g by mouth daily as needed. (  Patient not taking: Reported on 08/24/2022) 14 each 0   rasagiline (AZILECT) 0.5 MG TABS tablet Take 1 tablet (0.5 mg total) by mouth daily. 30 tablet 11   SYNTHROID 25 MCG tablet TAKE 1 TABLET MONDAY THROUGH SATURDAY, SKIP SUNDAY FOR HYPOTHYROIDISM (Patient not taking: Reported on 01/04/2023) 90 tablet 0   No current facility-administered  medications for this visit.    Medication Side Effects: None  Allergies:  Allergies  Allergen Reactions   Iodinated Contrast Media Anaphylaxis   Iodine Anaphylaxis    IV and topical forms. Other reaction(s): Unknown   Levsin [Hyoscyamine Sulfate]     Vision problems/pt has glaucoma   Salmon [Fish Allergy] Hives and Shortness Of Breath   Shellfish Allergy Anaphylaxis   Tramadol Swelling   Remeron [Mirtazapine] Other (See Comments)    Cause blurred vision and red eyes, pt has glaucoma   Aspirin Other (See Comments)    Sever stomach pain due to ulcer scaring.   Bis Subcit-Metronid-Tetracyc Swelling    Tongue swelling. Face tingling Other reaction(s): Unknown   Cephalexin Hives    Other reaction(s): hives   Ciprofloxacin Diarrhea   Codeine Nausea And Vomiting   Cyclobenzaprine Other (See Comments)    Tingly/prickly sensation. Other reaction(s): tingly/prickly sensation   Darvocet [Propoxyphene N-Acetaminophen] Nausea And Vomiting   Demerol [Meperidine] Nausea Only   Dexlansoprazole Swelling    Redness, swelling and peeling of both feet. Other reaction(s): foot pain   Diphedryl [Diphenhydramine] Other (See Comments)    Increased pulse/small amount ok   Doxycycline Hyclate Other (See Comments)    GI intolerance.   Doxycycline Hyclate     Other reaction(s): GI intolerance   Epinephrine Other (See Comments)    Breathing problems Other reaction(s): breathing problems/fainting   Erythromycin Other (See Comments)    GI intolerance. Other reaction(s): GI   Fish Oil     Other reaction(s): breathing problems/hives   Hyoscyamine     Other reaction(s): eye pain   Latex Other (See Comments)    Gloves ok.  Skin gets red from elastic in underwear and latex bandaides.   Meperidine Hcl     Other reaction(s): vomiting   Nitrofurantoin Diarrhea   Other     Other reaction(s): migraines Other reaction(s): Unknown Other reaction(s): Unknown Other reaction(s): Unknown Other  reaction(s): increased pulse, faint, diarrhea   Prednisone Other (See Comments)    Headache Other reaction(s): headache   Ra Diphedryl Allergy [Diphenhydramine Hcl]     Other reaction(s): increased pulse small dose okay   Sertraline Hcl Swelling and Other (See Comments)    Migraine Swelling of tongue/lip (09/2012) Other reaction(s): Unknown   Shellfish-Derived Products     Other reaction(s): Unknown   Sulfa Antibiotics Other (See Comments)    Increased pulse, fainting, diarrhea, thrush   Wellbutrin [Bupropion] Other (See Comments)    Headaches   Xylocaine [Lidocaine Hcl]     With epinephrine, given by dentist.  Speeded up heart rate and she passed out (occured twice, at dentist)   Xylocaine [Lidocaine]     Other reaction(s): Unknown   Biaxin [Clarithromycin] Rash    Started after completing 10 day course of 2000 mg /day, Lips swelling   Ibuprofen Other (See Comments)    Motrin ok with a GI effect. Other reaction(s): rash Motrin okay with a GI effect    Past Medical History:  Diagnosis Date   Allergy    Bell's palsy 1966   Hx: right side facial droop, resolved per patient 04/02/19  Carotid artery disease (HCC) 2010   on vascular screening;unchanged 2013.(could not tolerate simvastatin, no other statins tried)--<30% blockage bilat 07/2011   Chronic abdominal pain    Chronic fatigue and malaise    Claustrophobia    Cyst of left ovary    last imaging 06/2021, benign, no further f/u   Depression    treated in the past for years;stopped in 2010 for a years   Duodenal ulcer 1962   h/o   Dysrhythmia    ocassional PVC's, no current problems per patient on 04/02/19   Fibromyalgia    Frequent PVCs 07/2012   Seen by [facility]: benign, asymptomatic, normal EF   Gallstones 02/2021   on [place]   GERD (gastroesophageal reflux disease)    diet controlled   Glaucoma, narrow-angle    s/p laser surgery   History of hiatal hernia    during endoscopy   Hypothyroid 03/2007   IBS  (irritable bowel syndrome)    Dr. Elnoria Howard   Ischemic colitis (HCC) 11/21/2018   no current problems per patient on 04/02/19   Lichenoid keratosis 12/03/2020   Dr.Stinehelfer   Nausea and vomiting 10/29/2019   Ocular migraine    Osteoporosis 04/2010   Dr.Hawkes; later consulted Dr. Sharl Ma (2022)   Panic attack    Parkinson disease    Parkinson's disease 06/23/2016   Recurrent UTI    has cystocele-Dr.Grewal   Shingles 1999   h/o   Superficial thrombophlebitis 03/2009   RLE   Trochanteric bursitis 12/2008   bilateral    Family History  Problem Relation Age of Onset   Heart disease Mother    Hypertension Mother    Hypertension Sister    Arthritis Sister    Heart disease Brother    Lung cancer Brother        lung   Diabetes Maternal Grandfather    HIV Son    Diabetes Granddaughter        type 1   Colon cancer Neg Hx    Esophageal cancer Neg Hx    Prostate cancer Neg Hx    Stomach cancer Neg Hx    Rectal cancer Neg Hx     Social History   Socioeconomic History   Marital status: Married    Spouse name: Chrissie Noa   Number of children: 2   Years of education: Not on file   Highest education level: Not on file  Occupational History   Occupation: retired (school system)  Tobacco Use   Smoking status: Never   Smokeless tobacco: Never  Vaping Use   Vaping status: Never Used  Substance and Sexual Activity   Alcohol use: No    Alcohol/week: 0.0 standard drinks of alcohol   Drug use: No   Sexual activity: Not Currently    Partners: Male    Comment: Hysterectomy  Other Topics Concern   Not on file  Social History Narrative   Married.  Son lives in [place]; Daughter [name] lives in [place]; 2 grandchildren, 1 great grandson (in [place])      Updated 10/2022   Social Determinants of Health   Financial Resource Strain: Low Risk  (05/05/2021)   Overall Financial Resource Strain (CARDIA)    Difficulty of Paying Living Expenses: Not hard at all  Food Insecurity: Not on file   Transportation Needs: No Transportation Needs (05/05/2021)   PRAPARE - Administrator, Civil Service (Medical): No    Lack of Transportation (Non-Medical): No  Physical Activity: Not on file  Stress: Not on file  Social Connections: Unknown (12/06/2021)   [facility]   Social Network    Social Network: Not on file  Intimate Partner Violence: Unknown (10/28/2021)   Received from [facility]   HITS    Physically Hurt: Not on file    Insult or Talk Down To: Not on file    Threaten Physical Harm: Not on file    Scream or Curse: Not on file    Past Medical History, Surgical history, Social history, and Family history were reviewed and updated as appropriate.   Please see review of systems for further details on the patient's review from today.   Objective:   Physical Exam:  LMP  (LMP Unknown)   Physical Exam Neurological:     Mental Status: She is alert and oriented to person, place, and time.     Cranial Nerves: No dysarthria.  Psychiatric:        Attention and Perception: Attention and perception normal.        Mood and Affect: Mood normal.        Speech: Speech normal.        Behavior: Behavior is cooperative.        Thought Content: Thought content normal. Thought content is not paranoid or delusional. Thought content does not include homicidal or suicidal ideation. Thought content does not include homicidal or suicidal plan.        Cognition and Memory: Cognition and memory normal.        Judgment: Judgment normal.     Comments: Insight intact     Lab Review:     Component Value Date/Time   NA 144 07/12/2022 1632   K 4.3 07/12/2022 1632   CL 104 07/12/2022 1632   CO2 27 07/12/2022 1632   GLUCOSE 98 07/12/2022 1632   GLUCOSE 91 04/20/2022 1022   BUN 12 07/12/2022 1632   CREATININE 0.74 07/12/2022 1632   CREATININE 0.75 10/22/2018 1401   CREATININE 0.67 11/23/2016 0815   CALCIUM 9.7 07/12/2022 1632   PROT 7.0 07/12/2022 1632    ALBUMIN 4.3 07/12/2022 1632   AST 23 07/12/2022 1632   AST 17 10/22/2018 1401   ALT 6 07/12/2022 1632   ALT 7 10/22/2018 1401   ALKPHOS 70 07/12/2022 1632   BILITOT 1.2 07/12/2022 1632   BILITOT 0.8 10/22/2018 1401   GFRNONAA >60 04/20/2022 1022   GFRNONAA >60 10/22/2018 1401   GFRAA >60 03/16/2020 1153   GFRAA >60 10/22/2018 1401       Component Value Date/Time   WBC 8.7 10/16/2022 1551   WBC 7.7 04/20/2022 1022   RBC 4.58 10/16/2022 1551   RBC 5.06 04/20/2022 1022   HGB 14.2 10/16/2022 1551   HCT 41.7 10/16/2022 1551   PLT 255 10/16/2022 1551   MCV 91 10/16/2022 1551   MCH 31.0 10/16/2022 1551   MCH 30.2 04/20/2022 1022   MCHC 34.1 10/16/2022 1551   MCHC 32.3 04/20/2022 1022   RDW 12.3 10/16/2022 1551   LYMPHSABS 1.7 10/16/2022 1551   MONOABS 0.6 04/20/2022 1022   EOSABS 0.2 10/16/2022 1551   BASOSABS 0.1 10/16/2022 1551    No results found for: "POCLITH", "LITHIUM"   No results found for: "PHENYTOIN", "PHENOBARB", "VALPROATE", "CBMZ"   .res Assessment: Plan:    Plan:  PDMP reviewed  Xanax 0.5mg  - 2 x daily prn anxiety - usually undertakes  Home health - M-F coming to help out - 4 hours a day.  RTC 3 months  Patient advised to contact office with any questions, adverse effects, or acute worsening in signs and symptoms.  Discussed potential benefits, risk, and side effects of benzodiazepines to include potential risk of tolerance and dependence, as well as possible drowsiness. Advised patient not to drive if experiencing drowsiness and to take lowest possible effective dose to minimize risk of dependence and tolerance.  Diagnoses and all orders for this visit:  Major depressive disorder, recurrent episode, moderate (HCC)  Generalized anxiety disorder  Panic attacks  Insomnia, unspecified type    Please see After Visit Summary for patient specific instructions.  Future Appointments  Date Time Provider Department Center  02/13/2023  3:15 PM [place] [facility]  02/15/2023  1:00 PM Mathis Fare, LCSW [facility] None  05/07/2023  2:45 PM Joselyn Arrow, MD [facility]  05/16/2023  2:30 PM [place], Pearletha Furl, MD [facility] None  11/12/2023  1:45 PM Joselyn Arrow, MD [facility]    No orders of the defined types were placed in this encounter.     -------------------------------

## 2023-02-13 ENCOUNTER — Ambulatory Visit (INDEPENDENT_AMBULATORY_CARE_PROVIDER_SITE_OTHER): Payer: Medicare Other | Admitting: Podiatry

## 2023-02-13 DIAGNOSIS — G6289 Other specified polyneuropathies: Secondary | ICD-10-CM

## 2023-02-13 DIAGNOSIS — M19079 Primary osteoarthritis, unspecified ankle and foot: Secondary | ICD-10-CM | POA: Diagnosis not present

## 2023-02-14 ENCOUNTER — Ambulatory Visit (INDEPENDENT_AMBULATORY_CARE_PROVIDER_SITE_OTHER): Payer: Medicare Other | Admitting: Psychiatry

## 2023-02-14 DIAGNOSIS — F411 Generalized anxiety disorder: Secondary | ICD-10-CM | POA: Diagnosis not present

## 2023-02-14 NOTE — Progress Notes (Signed)
She presents today for follow-up of her painful hallux right foot.  States that she is starting to develop more numbness across the top of the foot but the hallux is really just killing her.  Last time she was in we had discussed possible extensor tenotomy.  Objective: Vital signs are stable she is alert and oriented x 3.  Pulses are palpable.  The toe is exquisitely sore and has significant osteoarthritic change at the level of the interphalangeal joint.  I am not sure that an extensor tenotomy would help the situation at all but because of the fixed position of the interphalangeal joint.  Assessment: Painful hallux secondary to osteoarthritic change.  Plan: Provided her with padding discussed appropriate shoe gear I will follow-up with her on an as-needed basis.

## 2023-02-14 NOTE — Progress Notes (Signed)
[facility] Counselor/Therapist Progress Note  Patient ID: Tracy Malone, MRN: [id number],    Date: 02/14/2023  Time Spent: 50 minutes   Treatment Type: Individual Therapy  Virtual Visit via Telehealth Note: telephone session as patient is not physically able to do the video Connected with patient by a telemedicine/telehealth application, with their informed consent, and verified patient privacy and that I am speaking with the correct person using two identifiers. I discussed the limitations, risks, security and privacy concerns of performing psychotherapy and the availability of in person appointments. I also discussed with the patient that there may be a patient responsible charge related to this service. The patient expressed understanding and agreed to proceed. I discussed the treatment planning with the patient. The patient was provided an opportunity to ask questions and all were answered. The patient agreed with the plan and demonstrated an understanding of the instructions. The patient was advised to call  our office if  symptoms worsen or feel they are in a crisis state and need immediate contact.   Therapist Location: office Patient Location: home  Reported Symptoms: anxiety, some frustrations, "not much depression"  Mental Status Exam:  Appearance:   N/A  telehealth      Behavior:  Appropriate, Sharing, and Motivated  Motor:  Uses rollator to walk  Speech/Language:   Clear and Coherent  Affect:  N/A  telehealth  Mood:  anxious  Thought process:  goal directed  Thought content:    Rumination  Sensory/Perceptual disturbances:    WNL  Orientation:  oriented to person, place, time/date, situation, day of week, month of year, year, and stated date of February 14, 2023  Attention:  Good  Concentration:  Good and Fair  Memory:  WNL  Fund of knowledge:   Good  Insight:    Good and Fair  Judgment:   Good  Impulse Control:  Good   Risk Assessment: Danger to Self:   No Self-injurious Behavior: No Danger to Others: No Duty to Warn:no Physical Aggression / Violence:No  Access to Firearms a concern: No  Gang Involvement:No   Subjective: Patient today reporting anxiety, some frustration "but not as bad as it was previously".  Reports no current depression.  States that she is remaining on her medications as prescribed.  Having decreased physical pain has definitely impacted her mood most recently.  Sleep is some better. Continues to have l some ow back and hip pain, "but not as bad as it was".  Is to have "a scan done on Sunday for left side of back due to pain." Reports being able to move around a bit better which is encouraging for her, along with the pain having subsided some.  Worked more today on some positive thought processes which she was able to do better because of having less pain, understandably.  Is looking forward to her son reportedly coming to visit her in August.  Was able to engage in conversation about several positives in her life especially her husband and their relationship, and her extended family.  Focusing on things that she can do at home including enjoying her music, some TV, phone calls with extended family, and possibly since her pain is less may be able to get outside a little more and see other people including longtime neighbors.  Encouraged her and her progress as she continues working with her goals and therapy.  Less self-defeating self talk this session which was definitely noticeable.  Also was able to mention  some things that she would like to do "in the future" which she had stopped talking much about the future previously.  Interventions: Cognitive Behavioral Therapy and Ego-Supportive  Long term goal: Develop the ability to recognize, accept, and cope with feelings of anxiety and depression. Short term goal: Verbalize any unresolved grief issues that may be contributing to anxiety and depression. Strategy: Replace negative  self-defeating self talk with verbalization of realistic and positive cognitive messages to lessen depression/anxiety and improve mood.  Diagnosis:   ICD-10-CM   1. Generalized anxiety disorder  F41.1      Plan:  Patient today participating well in session and working further on her anxiety, and some frustrations, all of which are mostly related to her ongoing medical issues and family stressors.  With her back pain having decreased some, that is definitely impacted her mood in a positive way.  She has made progress and needs to continue working with her goal-directed behaviors in order to move in a forward direction. Encouraged patient and practicing more positive/self affirming behaviors including: Remaining on her prescribed medication, refrain from assuming worst-case scenarios, use the "getting centered" list that we worked on her previous session to help her when she does have anxious or frustrating times, remember her positives, reach out to extended family via phone calls, listen to her music that she loves, positive self talk, get out of her house occasionally as she is able with help, and realize the strength she shows working with goal-directed behaviors to move in a direction that supports her improved emotional health and overall wellbeing.  Goal review and progress/challenges noted with patient.  Next appointment within 2 to 3 weeks.  Mathis Fare, LCSW

## 2023-02-15 ENCOUNTER — Ambulatory Visit: Payer: Medicare Other | Admitting: Psychiatry

## 2023-02-16 DIAGNOSIS — H18831 Recurrent erosion of cornea, right eye: Secondary | ICD-10-CM | POA: Diagnosis not present

## 2023-02-16 DIAGNOSIS — H04123 Dry eye syndrome of bilateral lacrimal glands: Secondary | ICD-10-CM | POA: Diagnosis not present

## 2023-02-18 ENCOUNTER — Ambulatory Visit (HOSPITAL_BASED_OUTPATIENT_CLINIC_OR_DEPARTMENT_OTHER)
Admission: RE | Admit: 2023-02-18 | Discharge: 2023-02-18 | Disposition: A | Discharge status: Home / Self Care | Payer: Medicare Other | Source: Ambulatory Visit | Attending: Neurosurgery | Admitting: Neurosurgery

## 2023-02-18 DIAGNOSIS — M47817 Spondylosis without myelopathy or radiculopathy, lumbosacral region: Secondary | ICD-10-CM | POA: Diagnosis not present

## 2023-02-18 DIAGNOSIS — M5126 Other intervertebral disc displacement, lumbar region: Secondary | ICD-10-CM | POA: Diagnosis not present

## 2023-02-18 DIAGNOSIS — M5416 Radiculopathy, lumbar region: Secondary | ICD-10-CM | POA: Diagnosis present

## 2023-02-18 DIAGNOSIS — M4807 Spinal stenosis, lumbosacral region: Secondary | ICD-10-CM | POA: Diagnosis not present

## 2023-02-18 DIAGNOSIS — M47816 Spondylosis without myelopathy or radiculopathy, lumbar region: Secondary | ICD-10-CM | POA: Diagnosis not present

## 2023-02-20 DIAGNOSIS — Z681 Body mass index (BMI) 19 or less, adult: Secondary | ICD-10-CM | POA: Diagnosis not present

## 2023-02-20 DIAGNOSIS — M533 Sacrococcygeal disorders, not elsewhere classified: Secondary | ICD-10-CM | POA: Diagnosis not present

## 2023-02-20 DIAGNOSIS — G8929 Other chronic pain: Secondary | ICD-10-CM | POA: Diagnosis not present

## 2023-02-26 ENCOUNTER — Encounter: Payer: Self-pay | Admitting: Neurology

## 2023-02-26 ENCOUNTER — Ambulatory Visit (INDEPENDENT_AMBULATORY_CARE_PROVIDER_SITE_OTHER): Payer: Medicare Other | Admitting: Psychiatry

## 2023-02-26 ENCOUNTER — Ambulatory Visit (INDEPENDENT_AMBULATORY_CARE_PROVIDER_SITE_OTHER): Payer: Medicare Other | Admitting: Neurology

## 2023-02-26 VITALS — BP 117/71 | HR 71 | Ht 59.0 in | Wt 88.0 lb

## 2023-02-26 DIAGNOSIS — F411 Generalized anxiety disorder: Secondary | ICD-10-CM

## 2023-02-26 DIAGNOSIS — G20A1 Parkinson's disease without dyskinesia, without mention of fluctuations: Secondary | ICD-10-CM

## 2023-02-26 DIAGNOSIS — G250 Essential tremor: Secondary | ICD-10-CM

## 2023-02-26 DIAGNOSIS — F418 Other specified anxiety disorders: Secondary | ICD-10-CM

## 2023-02-26 DIAGNOSIS — R269 Unspecified abnormalities of gait and mobility: Secondary | ICD-10-CM

## 2023-02-26 MED ORDER — CARBIDOPA-LEVODOPA ER 25-100 MG PO TBCR: EXTENDED_RELEASE_TABLET | ORAL | 3 refills | Status: DC

## 2023-02-26 NOTE — Addendum Note (Signed)
Addended by: Asa Lente on: 02/26/2023 06:46 PM   Modules accepted: Level of Service

## 2023-02-26 NOTE — Progress Notes (Signed)
[facility] Counselor/Therapist Progress Note  Patient ID: ALLEA HERTZBERG, MRN: [id number],    Date: 02/26/2023  Time Spent: 48 minutes   Treatment Type: Individual Therapy  Virtual Visit via Telehealth Note: Telephone only as patient not able to do the video session Connected with patient by a telemedicine/telehealth application, with their informed consent, and verified patient privacy and that I am speaking with the correct person using two identifiers. I discussed the limitations, risks, security and privacy concerns of performing psychotherapy and the availability of in person appointments. I also discussed with the patient that there may be a patient responsible charge related to this service. The patient expressed understanding and agreed to proceed. I discussed the treatment planning with the patient. The patient was provided an opportunity to ask questions and all were answered. The patient agreed with the plan and demonstrated an understanding of the instructions. The patient was advised to call  our office if  symptoms worsen or feel they are in a crisis state and need immediate contact.   Therapist Location: office Patient Location: home   Reported Symptoms: anxiety "not quite as bad",  depression "improved significantly"    Mental Status Exam:  Appearance:   N/a  telehealth        Behavior:  Appropriate, Sharing, and Motivated  Motor:  Uses rollator to walk  Speech/Language:   Clear and Coherent  Affect:  N/a  telehealth  Mood:  Anxiety (improving)  Thought process:  goal directed  Thought content:    WNL  Sensory/Perceptual disturbances:    WNL  Orientation:  oriented to person, place, time/date, situation, day of week, month of year, year, and stated date of Aug. 5, 2024  Attention:  Fair  Concentration:  Good and Fair  Memory:  WNL  Fund of knowledge:   Good  Insight:    Good and Fair  Judgment:   Good  Impulse Control:  Good   Risk Assessment: Danger to  Self:  No Self-injurious Behavior: No Danger to Others: No Duty to Warn:no Physical Aggression / Violence:No  Access to Firearms a concern: No  Gang Involvement:No   Subjective:  Patient today reporting decreased anxiety and decreased depression. Having less physical pain and remaining on meds as prescribed. Mood is some better. More energy. Actually laughed a couple times in session. Not as negative today. Denies hopelessness. Sleep is better. Still having some some low back pain and planning to get injection for that within 2 weeks. Able to hold on to "a couple of positive thoughts" which is progress for this patient. Encouraged her continued use of the positive thought patterns and recognizing  and working to let go of further negative thoughts. Also encouraged contact with the positive people in her life, and continue the activities that bring her joy including her music, phone calls with relatives, and time enjoying TV and especially her music. Continues to be less self-defeating in her outlook and in her talking.  Interventions: Cognitive Behavioral Therapy and Ego-Supportive  Long term goal: Develop the ability to recognize, accept, and cope with feelings of anxiety and depression. Short term goal: Verbalize any unresolved grief issues that may be contributing to anxiety and depression. Strategy: Replace negative self-defeating self talk with verbalization of realistic and positive cognitive messages to lessen depression/anxiety and improve mood.  Diagnosis:   ICD-10-CM   1. Generalized anxiety disorder  F41.1      Plan: Patient today showing good participation in session as she  continues working on her family stressors, anxiety, and  some depression dealing with ongoing medical issues. Mood is considerably better as noted above. Making progress and patient needs to continue working with goal-directed behaviors to move in a forward direction. Reminded and encouraged patient to be  practicing more positive/self affirming thoughts and behaviors including: Staying on her prescribed medication, refrain from assuming worst-case scenarios, use the "getting centered" list that we worked on in a previous session with patient to help her when she has anxious a frustrating times, remember her positives, reach out to extended family via phone calls, listening to her music that she loves and finds calming, use of positive self talk, get outside of her house occasionally as she is able with help, and recognize the strength she shows working with goal-directed behaviors to move in a direction that supports her improved emotional health and outlook.  Goal review and progress/challenges noted with patient.  Next appointment within 4 weeks. (To have back injection within couple wks)   Mathis Fare, LCSW

## 2023-02-26 NOTE — Progress Notes (Addendum)
[facility]  PATIENT: Tracy Malone DOB: 1932/07/24  REFERRING DOCTOR OR PCP:  Joselyn Arrow SOURCE: patient and EMR records  _________________________________   HISTORICAL  CHIEF COMPLAINT:  Chief Complaint  Patient presents with   Follow-up    Pt in room 10, husband in room. Here for Parkinson follow up. Pt reports bloating, reports she has constipation at times, takes colace. Pt said her eye get really red, just got new glasses about 1 month now. Pt said she falls asleep and then wakes up and can't go back to sleep.    HISTORY OF PRESENT ILLNESS:  Tracy Malone is a 89 y.o.  woman with PD (vs PD/BET), anxiety/depression and history of L1 fracture.    Update 02/26/2023: She reports a runny nose recently.    She denies a history of seasonal allergies.   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    She is on Sinemet.   She feels gait improved with Sinemet .  Alprazolam has helped her tremor more.  She had more benefit taking propranolol for the tremor but she had hypotension and needed to stop.  She is able to tolerate metoprolol a little better though it is less effective.  Her handwriting is poor.   Tremor is at rest and with intention.       She gets calf spasms at night once or twice a month that wakes her up.  She feels gait worsened after back surgery (L1 kyhoplasty) and toe issues.   She has sciatica  She is taking Sinemet 25/100 tid and tolerates it well .  She takes every 4-5 hours while awake.    She is not having GI issues.  She also has GERD and we discussed that medicatoins for it may slow Sinemet absorption.   She reports feeling worse.  She has more pain and feels more tired.  She continues to note a reduced appetite.  She has constipation. She is taking Sinemet 9 am, 1 pm, 5 pm.    She feels tremor is about the same    She notes it while  resting but gets worse when she writes or if she gets upset.. It is also worse when anxious.  She has more depression and cries a lot.  She is sleeping poorly due to pain  She has anxiety that has been a long-term issue.   She has depression with occasional crying.  She is not sure how much alprazolam helps.  She is on low-dose Prozac 10 mg.  Additionally for anxiety she is on alprazolam.  She fractured the L1 vertebrae September 2020 while moving a mattress and had kyphoplasty 04/03/2019.   She will be having RFA medial branches for facet syndrome (had good result with MBB temporarily.    She has sciatica.   Tremor History:      She first noted a right hand tremor in late 2015.    She notes the tremor in the right hand much more than the left.   It worsened with intention but also present at rest   Tremor is not present when asleep.    She has not noted the tremor in the head.   Initially, she did not have any difficulty with her gait or balance.   There is no family history of tremors.   Alprazolam seemed to help the tremor.  Propranolol helped the tremor quite a bit but was poorly tolerated.   She started to have more gait issues around 2019   Sinemet seemed to help the gait but not the tremor.       REVIEW OF SYSTEMS: Constitutional: No fevers, chills, sweats, or change in appetite Eyes: No visual changes, double vision, eye pain Ear, nose and throat: No hearing loss, ear pain, nasal congestion, sore throat Cardiovascular: No chest pain, palpitations.  Has had palpitations Respiratory:  No shortness of breath at rest or with exertion.   No wheezes GastrointestinaI: No nausea, vomiting, diarrhea, abdominal pain, fecal incontinence Genitourinary:  No dysuria, urinary retention or frequency.  No nocturia. Musculoskeletal:  No neck pain, back pain.  She notes right shoulder pain Integumentary: No rash, pruritus, skin lesions Neurological: as above Psychiatric: Some crying spells..  Some  anxiety Endocrine: No palpitations, diaphoresis, change in appetite, change in weigh or increased thirst Hematologic/Lymphatic:  No anemia, purpura, petechiae. Allergic/Immunologic: No itchy/runny eyes, nasal congestion, recent allergic reactions, rashes  ALLERGIES: Allergies  Allergen Reactions   Iodinated Contrast Media Anaphylaxis   Iodine Anaphylaxis    IV and topical forms. Other reaction(s): Unknown   Levsin [Hyoscyamine Sulfate]     Vision problems/pt has glaucoma   [place] [Fish Allergy] Hives and Shortness Of Breath   Shellfish Allergy Anaphylaxis   Tramadol Swelling   Remeron [Mirtazapine] Other (See Comments)    Cause blurred vision and red eyes, pt has glaucoma   Aspirin Other (See Comments)    Sever stomach pain due to ulcer scaring.   Bis Subcit-Metronid-Tetracyc Swelling    Tongue swelling. Face tingling Other reaction(s): Unknown   Cephalexin Hives    Other reaction(s): hives   Ciprofloxacin Diarrhea   Codeine Nausea And Vomiting   Cyclobenzaprine Other (See Comments)    Tingly/prickly sensation. Other reaction(s): tingly/prickly sensation   Darvocet [Propoxyphene N-Acetaminophen] Nausea And Vomiting   Demerol [Meperidine] Nausea Only   Dexlansoprazole Swelling    Redness, swelling and peeling of both feet. Other reaction(s): foot pain   Diphedryl [Diphenhydramine] Other (See Comments)    Increased pulse/small amount ok   Doxycycline Hyclate Other (See Comments)    GI intolerance.   Doxycycline Hyclate     Other reaction(s): GI intolerance   Epinephrine Other (See Comments)    Breathing problems Other reaction(s): breathing problems/fainting   Erythromycin Other (See Comments)    GI intolerance. Other reaction(s): GI   Fish Oil     Other reaction(s): breathing problems/hives   Hyoscyamine     Other reaction(s): eye pain   Latex Other (See Comments)    Gloves ok.  Skin gets red from elastic in underwear and latex bandaides.   Meperidine Hcl      Other reaction(s): vomiting   Nitrofurantoin Diarrhea   Other     Other reaction(s): migraines Other reaction(s): Unknown Other reaction(s): Unknown Other reaction(s): Unknown Other reaction(s): increased pulse, faint, diarrhea   Prednisone Other (See Comments)    Headache Other reaction(s): headache   Ra Diphedryl Allergy [Diphenhydramine Hcl]     Other reaction(s): increased pulse small dose okay   Sertraline Hcl Swelling and Other (See Comments)    Migraine Swelling of tongue/lip (09/2012) Other reaction(s): Unknown   Shellfish-Derived Products     Other reaction(s): Unknown   Sulfa Antibiotics Other (See Comments)    Increased pulse, fainting, diarrhea, thrush   Wellbutrin [Bupropion] Other (See Comments)    Headaches   Xylocaine [Lidocaine Hcl]  With epinephrine, given by dentist.  Speeded up heart rate and she passed out (occured twice, at dentist)   Xylocaine [Lidocaine]     Other reaction(s): Unknown   Biaxin [Clarithromycin] Rash    Started after completing 10 day course of 2000 mg /day, Lips swelling   Ibuprofen Other (See Comments)    Motrin ok with a GI effect. Other reaction(s): rash Motrin okay with a GI effect    HOME MEDICATIONS:  Current Outpatient Medications:    ALPRAZolam (XANAX) 0.5 MG tablet, TAKE FOUR TABLETS BY MOUTH DAILY AS NEEDED FOR ANXIETY, Disp: 120 tablet, Rfl: 2   b complex vitamins capsule, Take 1 capsule by mouth daily., Disp: , Rfl:    Cholecalciferol (VITAMIN D) 50 MCG (2000 UT) CAPS, Take 1 capsule by mouth daily., Disp: , Rfl:    Carbidopa-Levodopa ER (SINEMET CR) 25-100 MG tablet controlled release, TAKE ONE TABLET THREE TIMES DAILY, Disp: 270 tablet, Rfl: 3   dicyclomine (BENTYL) 10 MG capsule, TAKE 1 CAPSULE BY MOUTH EVERY 6 HOURS AS NEEDED (Patient not taking: Reported on 11/09/2022), Disp: 30 capsule, Rfl: 6   docusate sodium (COLACE) 100 MG capsule, Take 1 capsule (100 mg total) by mouth 2 (two) times daily as needed for mild  constipation., Disp: 60 capsule, Rfl: 0   nystatin (MYCOSTATIN) 100000 UNIT/ML suspension, Take 5 mLs (500,000 Units total) by mouth 4 (four) times daily. (Patient not taking: Reported on 01/04/2023), Disp: 120 mL, Rfl: 0   polyethylene glycol (MIRALAX) 17 g packet, Take 17 g by mouth daily as needed. (Patient not taking: Reported on 08/24/2022), Disp: 14 each, Rfl: 0   rasagiline (AZILECT) 0.5 MG TABS tablet, Take 1 tablet (0.5 mg total) by mouth daily., Disp: 30 tablet, Rfl: 11   SYNTHROID 25 MCG tablet, TAKE 1 TABLET MONDAY THROUGH SATURDAY, SKIP SUNDAY FOR HYPOTHYROIDISM (Patient not taking: Reported on 01/04/2023), Disp: 90 tablet, Rfl: 0  PAST MEDICAL HISTORY: Past Medical History:  Diagnosis Date   Allergy    Bell's palsy 1966   Hx: right side facial droop, resolved per patient 04/02/19   Carotid artery disease (HCC) 2010   on vascular screening;unchanged 2013.(could not tolerate simvastatin, no other statins tried)--<30% blockage bilat 07/2011   Chronic abdominal pain    Chronic fatigue and malaise    Claustrophobia    Cyst of left ovary    last imaging 06/2021, benign, no further f/u   Depression    treated in the past for years;stopped in 2010 for a years   Duodenal ulcer 1962   h/o   Dysrhythmia    ocassional PVC's, no current problems per patient on 04/02/19   Fibromyalgia    Frequent PVCs 07/2012   Seen by [facility]: benign, asymptomatic, normal EF   Gallstones 02/2021   on [place]   GERD (gastroesophageal reflux disease)    diet controlled   Glaucoma, narrow-angle    s/p laser surgery   History of hiatal hernia    during endoscopy   Hypothyroid 03/2007   IBS (irritable bowel syndrome)    Dr. Elnoria Howard   Ischemic colitis (HCC) 11/21/2018   no current problems per patient on 04/02/19   Lichenoid keratosis 12/03/2020   Dr.Stinehelfer   Nausea and vomiting 10/29/2019   Ocular migraine    Osteoporosis 04/2010   Dr.Hawkes; later consulted Dr. Sharl Ma (2022)   Panic attack     Parkinson disease    Parkinson's disease 06/23/2016   Recurrent UTI    has cystocele-Dr.Grewal  Shingles 1999   h/o   Superficial thrombophlebitis 03/2009   RLE   Trochanteric bursitis 12/2008   bilateral    PAST SURGICAL HISTORY: Past Surgical History:  Procedure Laterality Date   ABDOMINAL HYSTERECTOMY     BACK SURGERY  05/2019   CATARACT EXTRACTION, BILATERAL  1995, 1996   EYE SURGERY Bilateral    laser - glaucoma   Flexible sigmoidoscopy     FRACTURE SURGERY  2020, Sept.  12th (!)   KYPHOPLASTY N/A 04/03/2019   Procedure: KYPHOPLASTY L1;  Surgeon: Venita Lick, MD;  Location: [facility] OR;  Service: Orthopedics;  Laterality: N/A;  90 mins   RADIOFREQUENCY ABLATION Bilateral 11/14/2022   L3-4, L4-5 bilateral ( Neurosurg)   THYROIDECTOMY, PARTIAL  09/2005   L nodule; Dr. Gerrit Friends   TONSILLECTOMY  1946   UPPER GI ENDOSCOPY  06/27/2012   VAGINAL HYSTERECTOMY  1971   and bladder repair.  Still has ovaries   WISDOM TOOTH EXTRACTION      FAMILY HISTORY: Family History  Problem Relation Age of Onset   Heart disease Mother    Hypertension Mother    Hypertension Sister    Arthritis Sister    Heart disease Brother    Lung cancer Brother        lung   Diabetes Maternal Grandfather    HIV Son    Diabetes Granddaughter        type 1   Colon cancer Neg Hx    Esophageal cancer Neg Hx    Prostate cancer Neg Hx    Stomach cancer Neg Hx    Rectal cancer Neg Hx     SOCIAL HISTORY:  Social History   Socioeconomic History   Marital status: Married    Spouse name: Chrissie Noa   Number of children: 2   Years of education: Not on file   Highest education level: Not on file  Occupational History   Occupation: retired (school system)  Tobacco Use   Smoking status: Never   Smokeless tobacco: Never  Vaping Use   Vaping status: Never Used  Substance and Sexual Activity   Alcohol use: No    Alcohol/week: 0.0 standard drinks of alcohol   Drug use: No   Sexual activity:  Not Currently    Partners: Male    Comment: Hysterectomy  Other Topics Concern   Not on file  Social History Narrative   Married.  Son lives in [place]; Daughter [name] lives in [place]; 2 grandchildren, 1 great grandson (in [place])      Updated 10/2022   Social Determinants of Health   Financial Resource Strain: Low Risk  (05/05/2021)   Overall Financial Resource Strain (CARDIA)    Difficulty of Paying Living Expenses: Not hard at all  Food Insecurity: Not on file  Transportation Needs: No Transportation Needs (05/05/2021)   PRAPARE - Administrator, Civil Service (Medical): No    Lack of Transportation (Non-Medical): No  Physical Activity: Not on file  Stress: Not on file  Social Connections: Unknown (12/06/2021)   [facility]   Social Network    Social Network: Not on file  Intimate Partner Violence: Unknown (10/28/2021)   Received from [facility]   HITS    Physically Hurt: Not on file    Insult or Talk Down To: Not on file    Threaten Physical Harm: Not on file    Scream or Curse: Not on file     PHYSICAL EXAM  Vitals:  02/26/23 1340  BP: 117/71  Pulse: 71  Weight: 88 lb (39.9 kg)  Height: 4\' 11"  (1.499 m)    Body mass index is 17.77 kg/m.   General: The patient is well-developed and well-nourished and in no acute distress  Skin:   No edema.    There were a couple small subcutaneous nodules in the right arm. .    Neurologic Exam  Mental status: The patient is alert and oriented x 3 at the time of the examination. The patient has apparent normal recent and remote memory, with an apparently normal attention span and concentration ability.   Speech is normal.  Cranial nerves: Extraocular movements are full.   Facial strength is normal.  Trapezius and sternocleidomastoid strength is normal. No dysarthria is noted.    Motor: She has mild bradykinesia.  There is a 5-6 Hz tremor on the right > left  hand.  It is worse during rest and  also increases with intention.  Talking about it worsened it.   Mild cogwheeling.   Muscle bulk is normal.   Strength is  5 / 5 in all 4 extremities.   Sensory: Sensory testing is intact to touch and vibration sensation in all 4 extremities.  Coordination: Cerebellar testing reveals good finger-nose-finger bilaterally.  Gait and station: Station is normal.   The gait has a mildly reduced stride, and normal arm swing.  Able to walk withou a cane  She was able to turn 180 degrees in 4 steps Romberg is negative.  Reflexes: Deep tendon reflexes are symmetric and normal bilaterally.    DIAGNOSTIC DATA (LABS, IMAGING, TESTING) - I reviewed patient records, labs, notes, testing and imaging myself where available.  Lab Results  Component Value Date   WBC 8.7 10/16/2022   HGB 14.2 10/16/2022   HCT 41.7 10/16/2022   MCV 91 10/16/2022   PLT 255 10/16/2022      Component Value Date/Time   NA 144 07/12/2022 1632   K 4.3 07/12/2022 1632   CL 104 07/12/2022 1632   CO2 27 07/12/2022 1632   GLUCOSE 98 07/12/2022 1632   GLUCOSE 91 04/20/2022 1022   BUN 12 07/12/2022 1632   CREATININE 0.74 07/12/2022 1632   CREATININE 0.75 10/22/2018 1401   CREATININE 0.67 11/23/2016 0815   CALCIUM 9.7 07/12/2022 1632   PROT 7.0 07/12/2022 1632   ALBUMIN 4.3 07/12/2022 1632   AST 23 07/12/2022 1632   AST 17 10/22/2018 1401   ALT 6 07/12/2022 1632   ALT 7 10/22/2018 1401   ALKPHOS 70 07/12/2022 1632   BILITOT 1.2 07/12/2022 1632   BILITOT 0.8 10/22/2018 1401   GFRNONAA >60 04/20/2022 1022   GFRNONAA >60 10/22/2018 1401   GFRAA >60 03/16/2020 1153   GFRAA >60 10/22/2018 1401   Lab Results  Component Value Date   CHOL 198 03/10/2021   HDL 66 03/10/2021   LDLCALC 112 (H) 03/10/2021   TRIG 111 03/10/2021   CHOLHDL 3.0 03/10/2021   Lab Results  Component Value Date   HGBA1C 5.4 02/05/2023   No results found for: "VITAMINB12" Lab Results  Component Value Date   TSH 0.89 02/05/2023        ASSESSMENT AND PLAN    1. Parkinson's disease without dyskinesia, unspecified whether manifestations fluctuate   2. Essential tremor   3. Gait disturbance   4. Depression with anxiety      1.    She appears to have PD/BET overlap.  continue Sinemet CR 25/100 3 pills  a day.  She had clear-cut benefit when initiated.   She opted not to take Azilect.    Continue alprazolam (written by psychiatry) which will help BET some 2.   Gait is stable though turns are wide.  Advised to use walker or cane for safety 3.    She has had weight loss.    She has GI issues with many medications.    Ok to take sena -- try every other day first.   5.   She will return to see me in 6 months or as needed if there are new or worsening neurologic symptoms.   This visit is part of a comprehensive longitudinal care medical relationship regarding the patients primary diagnosis of Parkinson's disease and related concerns.  Kalden Wanke A. Epimenio Foot, MD, PhD 02/26/2023, 2:02 PM Certified in Neurology, Clinical Neurophysiology, Sleep Medicine, Pain Medicine and Neuroimaging  [facility] [address] [place], [place] [address] [phone]

## 2023-03-08 DIAGNOSIS — M533 Sacrococcygeal disorders, not elsewhere classified: Secondary | ICD-10-CM | POA: Diagnosis not present

## 2023-03-13 ENCOUNTER — Ambulatory Visit: Payer: Medicare Other | Admitting: Adult Health

## 2023-03-15 ENCOUNTER — Other Ambulatory Visit: Payer: Self-pay | Admitting: Family Medicine

## 2023-03-15 DIAGNOSIS — E039 Hypothyroidism, unspecified: Secondary | ICD-10-CM

## 2023-03-19 ENCOUNTER — Encounter: Payer: Self-pay | Admitting: Family Medicine

## 2023-03-19 ENCOUNTER — Ambulatory Visit (INDEPENDENT_AMBULATORY_CARE_PROVIDER_SITE_OTHER): Payer: Medicare Other | Admitting: Family Medicine

## 2023-03-19 VITALS — BP 134/70 | HR 72 | Temp 98.5°F | Ht 59.0 in | Wt 85.4 lb

## 2023-03-19 DIAGNOSIS — F332 Major depressive disorder, recurrent severe without psychotic features: Secondary | ICD-10-CM | POA: Diagnosis not present

## 2023-03-19 DIAGNOSIS — E46 Unspecified protein-calorie malnutrition: Secondary | ICD-10-CM

## 2023-03-19 DIAGNOSIS — K582 Mixed irritable bowel syndrome: Secondary | ICD-10-CM

## 2023-03-19 DIAGNOSIS — F411 Generalized anxiety disorder: Secondary | ICD-10-CM

## 2023-03-19 NOTE — Progress Notes (Signed)
Chief Complaint  Patient presents with   Abdominal Pain    Left abd pain that has been ongoing. Had shot 03/08/23 L side SI joint. She told the doctor about it, he said it cannot be gas and told her to see her PCP and have an [place] or MRI. Wants to wait on her flu shot.   Patient presents with complaint of ongoing left-sided abdominal pain.  She is accompanied by her caregiver Iris  This has been an ongoing issue, periodically. She reports she hadn't had a bowel movement x 4 days last week, so she restarted taking Miralax on Friday. She has been taking 1/2 capful daily. She has been having frequent bowel movements, which are very soft, "mushy".  She had 5-6 BM's daily the last 2 days, 3 so far today. She denies any mucus or blood in the stools. She has some temporary improvement in her L sided discomfort after a bowel movement. She denies feeling gassy. She feels weak. Feels nauseated. Decreased appetite--afraid to eat. She denies heartburn/indigestion.  She does endorse some cramping, and crampy pain.  She has not tried bentyl for this, has it at home.  She reports she tried following the Mediterranean diet, and didn't like it--she ate more nuts, which bothered her. She does like pistachios. She didn't tolerate avocado.   PMH, PSH, SH reviewed  Outpatient Encounter Medications as of 03/19/2023  Medication Sig Note   ALPRAZolam (XANAX) 0.5 MG tablet TAKE FOUR TABLETS BY MOUTH DAILY AS NEEDED FOR ANXIETY 03/19/2023: Took 1/2 tablet today   b complex vitamins capsule Take 1 capsule by mouth daily.    Carbidopa-Levodopa ER (SINEMET CR) 25-100 MG tablet controlled release TAKE ONE TABLET THREE TIMES DAILY    Cholecalciferol (VITAMIN D) 50 MCG (2000 UT) CAPS Take 1 capsule by mouth daily.    polyethylene glycol (MIRALAX) 17 g packet Take 17 g by mouth daily as needed. 03/19/2023: Took yesterday   SYNTHROID 25 MCG tablet TAKE 1 TABLET DAILY MONDAY THROUGH SATURDAY, SKIP SUNDAY FOR HYPOTHYROIDISM     dicyclomine (BENTYL) 10 MG capsule TAKE 1 CAPSULE BY MOUTH EVERY 6 HOURS AS NEEDED (Patient not taking: Reported on 11/09/2022) 03/19/2023: As needed   [DISCONTINUED] docusate sodium (COLACE) 100 MG capsule Take 1 capsule (100 mg total) by mouth 2 (two) times daily as needed for mild constipation. 01/04/2023: Took one last night   No facility-administered encounter medications on file as of 03/19/2023.   Allergies  Allergen Reactions   Iodinated Contrast Media Anaphylaxis   Iodine Anaphylaxis    IV and topical forms. Other reaction(s): Unknown   Levsin [Hyoscyamine Sulfate]     Vision problems/pt has glaucoma   Salmon [Fish Allergy] Hives and Shortness Of Breath   Shellfish Allergy Anaphylaxis   Tramadol Swelling   Remeron [Mirtazapine] Other (See Comments)    Cause blurred vision and red eyes, pt has glaucoma   Aspirin Other (See Comments)    Sever stomach pain due to ulcer scaring.   Bis Subcit-Metronid-Tetracyc Swelling    Tongue swelling. Face tingling Other reaction(s): Unknown   Cephalexin Hives    Other reaction(s): hives   Ciprofloxacin Diarrhea   Codeine Nausea And Vomiting   Cyclobenzaprine Other (See Comments)    Tingly/prickly sensation. Other reaction(s): tingly/prickly sensation   Darvocet [Propoxyphene N-Acetaminophen] Nausea And Vomiting   Demerol [Meperidine] Nausea Only   Dexlansoprazole Swelling    Redness, swelling and peeling of both feet. Other reaction(s): foot pain   Diphedryl [Diphenhydramine] Other (See  Comments)    Increased pulse/small amount ok   Doxycycline Hyclate Other (See Comments)    GI intolerance.   Doxycycline Hyclate     Other reaction(s): GI intolerance   Epinephrine Other (See Comments)    Breathing problems Other reaction(s): breathing problems/fainting   Erythromycin Other (See Comments)    GI intolerance. Other reaction(s): GI   Fish Oil     Other reaction(s): breathing problems/hives   Hyoscyamine     Other reaction(s): eye  pain   Latex Other (See Comments)    Gloves ok.  Skin gets red from elastic in underwear and latex bandaides.   Meperidine Hcl     Other reaction(s): vomiting   Nitrofurantoin Diarrhea   Other     Other reaction(s): migraines Other reaction(s): Unknown Other reaction(s): Unknown Other reaction(s): Unknown Other reaction(s): increased pulse, faint, diarrhea   Prednisone Other (See Comments)    Headache Other reaction(s): headache   Ra Diphedryl Allergy [Diphenhydramine Hcl]     Other reaction(s): increased pulse small dose okay   Sertraline Hcl Swelling and Other (See Comments)    Migraine Swelling of tongue/lip (09/2012) Other reaction(s): Unknown   Shellfish-Derived Products     Other reaction(s): Unknown   Sulfa Antibiotics Other (See Comments)    Increased pulse, fainting, diarrhea, thrush   Wellbutrin [Bupropion] Other (See Comments)    Headaches   Xylocaine [Lidocaine Hcl]     With epinephrine, given by dentist.  Speeded up heart rate and she passed out (occured twice, at dentist)   Xylocaine [Lidocaine]     Other reaction(s): Unknown   Biaxin [Clarithromycin] Rash    Started after completing 10 day course of 2000 mg /day, Lips swelling   Ibuprofen Other (See Comments)    Motrin ok with a GI effect. Other reaction(s): rash Motrin okay with a GI effect   ROS: no fever, chills, URI symptoms.  +GI complaints per HPI. +weight loss +depression, worries a lot about her husband. No chest pain, shortness of breath.  No changes to hair/skin. Back pain improved since recent SI joint injection.    PHYSICAL EXAM:  BP 134/70   Pulse 72   Temp 98.5 F (36.9 C) (Tympanic)   Ht 4\' 11"  (1.499 m)   Wt 85 lb 6.4 oz (38.7 kg)   LMP  (LMP Unknown)   BMI 17.25 kg/m   Wt Readings from Last 3 Encounters:  03/19/23 85 lb 6.4 oz (38.7 kg)  02/26/23 88 lb (39.9 kg)  01/04/23 88 lb 3.2 oz (40 kg)   Elderly, thin, frail-appearing female. She is alert and oriented. HEENT:  conjunctiva and sclera are clear, EOMI, OP clear Neck: no lymphadenopathy or mass Heart: regular rate and rhythm Lungs: clear Back: no spinal or CVA tenderness. Abdomen: soft, nondistended.  Normoactive bowel sounds.  Tender in LUQ, no mass, rebound or guarding. Nontender elsewhere. Extremities: no edema Psych: full range of affect, intermittently tearful. Depressed/anxious.  Normal eye contact, speech, hygiene and grooming. Neuro: alert and oriented, grossly normal cranial nerves, walks with cane.    ASSESSMENT/PLAN:  Irritable bowel syndrome with both constipation and diarrhea Assessment & Plan: LUQ pain--initially constipated, now having frequent stools since taking Miralax. Discussed titrating Miralax dose down, but continue it fairly regularly. Has prev had imaging. No red flags in history of exam. She does have some weight loss--related to fear of eating/anxiety. Can use bentyl prn for cramping/spasm. Counseled on proper diet (high fiber, stay hydrated, bland)   Protein-calorie malnutrition, unspecified severity (  HCC) Assessment & Plan: Counseled re: diet.  Discussed Mediterranean diet--not being rigid or eating things she doesn't like or doesn't agree with her. She likes pistachios--recommended small amounts as snacks. Discussed fruits/vegetables/high fiber diet (didn't tolerate fiber supplements in the past). To eat a fairly bland diet. To avoid processed foods/meats.   Severe recurrent major depression without psychotic features [facility]) Assessment & Plan: Under care of psych. Counseled today--reminding her of the good things in her life (support, loving family, her memory/brain, etc), rather than focusing on the things that bother/worry her (her pain, husband's breathing issues).   Anxiety state Assessment & Plan: Under care of psych and therapist. Very worried about her husband. Counseled today.      I spent 55 minutes dedicated to the care of this patient, including  pre-visit review of records, face to face time, post-visit ordering of testing and documentation.  Please back down on your Miralax use. It is fine to use once daily just initially when you are constipated, but once you start going more regularly, you want to back down on either the dose or the frequency, to avoid having frequent stools or diarrhea. You can cut back to every other day (and cut back further if still too frequent), vs using it daily, but a smaller amount (ie 1/4 capful or less).  You may use the dicyclomine (bentyl) as needed for cramping  Go back to your bland, "BRAT" diet--bananas, rice, applesauce, toast.  Fine to have baked chicken with it.  Try and eat a high fiber diet.  Eat whole grain breads. Continue to eat chick peas and hummus.  You can also eat beans and lentils if your stomach tolerates these. Eat the vegetables that you like, avoid the ones that you don't or that bother your stomach. Snack on pistachios (small quantities, as tolerated).  Drink plenty of fluids.

## 2023-03-19 NOTE — Assessment & Plan Note (Signed)
LUQ pain--initially constipated, now having frequent stools since taking Miralax. Discussed titrating Miralax dose down, but continue it fairly regularly. Has prev had imaging. No red flags in history of exam. She does have some weight loss--related to fear of eating/anxiety. Can use bentyl prn for cramping/spasm. Counseled on proper diet (high fiber, stay hydrated, bland)

## 2023-03-19 NOTE — Assessment & Plan Note (Signed)
Under care of psych. Counseled today--reminding her of the good things in her life (support, loving family, her memory/brain, etc), rather than focusing on the things that bother/worry her (her pain, husband's breathing issues).

## 2023-03-19 NOTE — Assessment & Plan Note (Signed)
Under care of psych and therapist. Very worried about her husband. Counseled today.

## 2023-03-19 NOTE — Assessment & Plan Note (Signed)
Counseled re: diet.  Discussed Mediterranean diet--not being rigid or eating things she doesn't like or doesn't agree with her. She likes pistachios--recommended small amounts as snacks. Discussed fruits/vegetables/high fiber diet (didn't tolerate fiber supplements in the past). To eat a fairly bland diet. To avoid processed foods/meats.

## 2023-03-19 NOTE — Patient Instructions (Signed)
Please back down on your Miralax use. It is fine to use once daily just initially when you are constipated, but once you start going more regularly, you want to back down on either the dose or the frequency, to avoid having frequent stools or diarrhea. You can cut back to every other day (and cut back further if still too frequent), vs using it daily, but a smaller amount (ie 1/4 capful or less).  You may use the dicyclomine (bentyl) as needed for cramping  Go back to your bland, "BRAT" diet--bananas, rice, applesauce, toast.  Fine to have baked chicken with it.  Try and eat a high fiber diet.  Eat whole grain breads. Continue to eat chick peas and hummus.  You can also eat beans and lentils if your stomach tolerates these. Eat the vegetables that you like, avoid the ones that you don't or that bother your stomach. Snack on pistachios (small quantities, as tolerated).  Drink plenty of fluids.

## 2023-03-21 ENCOUNTER — Ambulatory Visit (INDEPENDENT_AMBULATORY_CARE_PROVIDER_SITE_OTHER): Payer: Medicare Other | Admitting: Psychiatry

## 2023-03-21 DIAGNOSIS — H04322 Acute dacryocystitis of left lacrimal passage: Secondary | ICD-10-CM | POA: Diagnosis not present

## 2023-03-21 DIAGNOSIS — H04123 Dry eye syndrome of bilateral lacrimal glands: Secondary | ICD-10-CM | POA: Diagnosis not present

## 2023-03-21 DIAGNOSIS — F411 Generalized anxiety disorder: Secondary | ICD-10-CM

## 2023-03-21 DIAGNOSIS — H18831 Recurrent erosion of cornea, right eye: Secondary | ICD-10-CM | POA: Diagnosis not present

## 2023-03-21 DIAGNOSIS — H00013 Hordeolum externum right eye, unspecified eyelid: Secondary | ICD-10-CM | POA: Diagnosis not present

## 2023-03-21 NOTE — Progress Notes (Addendum)
[facility] Counselor/Therapist Progress Note  Patient ID: GIZZELLE CREDIT, MRN: [id number],    Date: 03/21/2023  Time Spent: 50 minutes   Treatment Type: Individual Therapy  Virtual Visit via Telehealth Note: Telephone visit only as patient not able to do the Video session Connected with patient by a telemedicine/telehealth application, with their informed consent, and verified patient privacy and that I am speaking with the correct person using two identifiers. I discussed the limitations, risks, security and privacy concerns of performing psychotherapy and the availability of in person appointments. I also discussed with the patient that there may be a patient responsible charge related to this service. The patient expressed understanding and agreed to proceed. I discussed the treatment planning with the patient. The patient was provided an opportunity to ask questions and all were answered. The patient agreed with the plan and demonstrated an understanding of the instructions. The patient was advised to call  our office if  symptoms worsen or feel they are in a crisis state and need immediate contact.   Therapist Location: office Patient Location: home   Reported Symptoms: anxiety, depression improved   Mental Status Exam:  Appearance:   N/a  telehealth      Behavior:  Appropriate, Sharing, and Motivated  Motor:  Uses cane to walk  Speech/Language:   Clear and Coherent  Affect:  N/a telehealth  Mood:  anxious and some depression  Thought process:  goal directed  Thought content:    Rumination  Sensory/Perceptual disturbances:    WNL  Orientation:  oriented to person, place, time/date, situation, day of week, month of year, year, and stated date of Aug. 28, 2024  Attention:  Good  Concentration:  Good  Memory:  WNL  Fund of knowledge:   Good  Insight:    Good and Fair  Judgment:   Good  Impulse Control:  Good   Risk Assessment: Danger to Self:  No Self-injurious  Behavior: No Danger to Others: No Duty to Warn:no Physical Aggression / Violence:No  Access to Firearms a concern: No  Gang Involvement:No   Subjective: Patient today reporting anxiety and decreased depression. Anxiety mostly related to her multiple medical problems, family relationships, and "not being able to do all the things I used to be able to to." Difficulty "being patient." Looking forward to her son coming to visit her soon, as he has reportedly gotten approval from his p.o. patient reports less physical pain today and that is helping her mood.  Today needed to share and work on certain family relationships and also adjusting to the fact "I cannot do everything that I used to do".  Patient is 89 years old and with increased physical problems, there are obstacles that prevent her from doing "all I used to do".  Talked through some disappointment as well as some grief acknowledging certain physical limitations, but also acknowledging how at her age, she still can do some things and moving about and cognitively she functions much better than some of her peers.  Encouraged patient to focus on her strengths and continue to hold onto positive thoughts that she has had more recently and shared today.  Also encouraged her to be in contact with friends and family with whom she has positive relationships, and to consider getting outside some with doctors approval, as she has felt very confined inside.  Trying to let go more of negative thoughts from the past and be able to create more of a pattern of  positive thoughts going forward.  Encouraged her to continue activities that bring her joy including enjoying her music, watching certain TV programs, talking with relatives.  Mood has been noticeably improved today.  Interventions: Cognitive Behavioral Therapy and Ego-Supportive  Long term goal: Develop the ability to recognize, accept, and cope with feelings of anxiety and depression. Short term  goal: Verbalize any unresolved grief issues that may be contributing to anxiety and depression. Strategy: Replace negative self-defeating self talk with verbalization of realistic and positive cognitive messages to lessen depression/anxiety and improve mood.   Diagnosis:   ICD-10-CM   1. Generalized anxiety disorder  F41.1      Plan:  Patient today participating well in session and as noted above is showing more progress than in recent weeks.  She does need to continue working with goal-directed behaviors in order to keep moving in a forward direction.  Reminded and encouraged patient to be practicing more positive/self affirming thoughts and behaviors including: Remaining on her prescribed medication, refrain from assuming worst-case scenarios, use the "getting centered" list that we worked on in her previous session to help her when she has anxious or frustrating times, remember her positives, reach out to extended family via phone calls, listen to her music that she loves and finds calming, positive self talk, get outside of her house occasionally as she is able to with help, and realize the strengths she shows working with goal-directed behaviors to move in a direction that supports her improved emotional health and overall wellbeing.  Goal review and progress/challenges noted with patient.  Next appointment within 3 weeks.   Mathis Fare, LCSW

## 2023-03-21 NOTE — Addendum Note (Signed)
Addended byMathis Fare on: 03/21/2023 04:04 PM   Modules accepted: Level of Service

## 2023-04-05 ENCOUNTER — Ambulatory Visit: Payer: Medicare Other | Admitting: Psychiatry

## 2023-04-05 DIAGNOSIS — F411 Generalized anxiety disorder: Secondary | ICD-10-CM

## 2023-04-05 NOTE — Progress Notes (Signed)
[facility] Counselor/Therapist Progress Note  Patient ID: Tracy Malone, MRN: [id number],    Date: 04/05/2023  Time Spent: 48 minutes   Treatment Type: Individual Therapy  Virtual Visit via Telehealth Note: Telephone session as patient is not physically able to do the MyChart Video Connected with patient by a telemedicine/telehealth application, with their informed consent, and verified patient privacy and that I am speaking with the correct person using two identifiers. I discussed the limitations, risks, security and privacy concerns of performing psychotherapy and the availability of in person appointments. I also discussed with the patient that there may be a patient responsible charge related to this service. The patient expressed understanding and agreed to proceed. I discussed the treatment planning with the patient. The patient was provided an opportunity to ask questions and all were answered. The patient agreed with the plan and demonstrated an understanding of the instructions. The patient was advised to call  our office if  symptoms worsen or feel they are in a crisis state and need immediate contact.   Therapist Location: office Patient Location: home   Reported Symptoms: anxiety, frustration, "no depression"   Mental Status Exam:  Appearance:   N/a  telehealth      Behavior:  Appropriate and Sharing  Motor:  Uses cane or rollater when walking  Speech/Language:   Clear and Coherent  Affect:  N/a telehealth  Mood:  anxious  Thought process:  goal directed  Thought content:    Rumination  Sensory/Perceptual disturbances:    WNL  Orientation:  oriented to person, place, time/date, situation, day of week, month of year, year, and stated date of Sept. 12, 2024  Attention:  Fair  Concentration:  Good  Memory:  WNL  Fund of knowledge:   Good  Insight:    Good  Judgment:   Good  Impulse Control:  Good   Risk Assessment: Danger to Self:  No Self-injurious  Behavior: No Danger to Others: No Duty to Warn:no Physical Aggression / Violence:No  Access to Firearms a concern: No  Gang Involvement:No   Subjective:   Patient in virtual session today reporting anxiety, "no depression", but just "tired of all my medical problems", and concerned about her husband and his health concerns. Did not follow through in reaching out to her church family nor neighbors. Worked some on her frustration and anger issues today and was able to vent a lot and feel heard.  A good part of her frustration and anger is in reference to some family issues from the past that continues some in the present, but also her ongoing medical issues and discomfort.  Patient did quite well in sharing her concerns and seemed to be feeling heard.  Her son who lives in [place] and has not been able to visit her in a while is planning a visit later this month, and patient spoke very favorably about this.  She is 89 years old and realizes she is having more more pain and that is hard to deal with.  Have encouraged her to consider getting outside of her home a little more which she is physically able to do, and her husband has encouraged her to be outside with him and speak more with neighbors who are close by and often sit outside in their driveways.  And working with patient there is a blend of empathizing with her challenges of aging and also trying to help support her doing what ever she can in order to  stay connected in someway with other people whether it be in person, by phone, or online.  Patient shared and we worked more today on some of her grief over the loss of certain physical abilities although she is rather sharp mentally/emotionally.  Encouraged patient to remain on her medications as prescribed, stay involved in activities that bring her joy including contact with relatives, spending meaningful time with her husband, listening to her favorite music, and watching favorite TV  programs.  Interventions: Cognitive Behavioral Therapy and Ego-Supportive  Long term goal: Develop the ability to recognize, accept, and cope with feelings of anxiety and depression. Short term goal: Verbalize any unresolved grief issues that may be contributing to anxiety and depression. Strategy: Replace negative self-defeating self talk with verbalization of realistic and positive cognitive messages to lessen depression/anxiety and improve mood.  Diagnosis:   ICD-10-CM   1. Generalized anxiety disorder  F41.1      Plan: Patient today actively participating in session and has made progress over time.  She needs to continue working with goal-directed behaviors in order to keep herself moving and a positive direction. Reminded and encouraged patient in her practice of more positive/self affirming thoughts and behaviors including: Staying on her prescribed medication, refrain from assuming worst-case scenarios, use the "getting centered" list that we worked on in her previous session to help her when she has anxious or frustrating times, remember her positives, reach out to extended family via phone calls, listen to her music that she loves and finds calming, positive self talk, get outside of her house occasionally as she is able to with help, and recognize the strengths she shows working with goal-directed behaviors to move in a direction that supports her improved emotional health and overall wellbeing.  Goal review and progress/challenges noted with patient.  Next appointment within 3 weeks.   Mathis Fare, LCSW

## 2023-04-11 ENCOUNTER — Encounter: Payer: Self-pay | Admitting: Family Medicine

## 2023-04-11 ENCOUNTER — Ambulatory Visit (INDEPENDENT_AMBULATORY_CARE_PROVIDER_SITE_OTHER): Payer: Medicare Other | Admitting: Family Medicine

## 2023-04-11 VITALS — BP 132/68 | HR 72 | Temp 98.9°F | Ht 59.0 in | Wt 83.6 lb

## 2023-04-11 DIAGNOSIS — R197 Diarrhea, unspecified: Secondary | ICD-10-CM

## 2023-04-11 DIAGNOSIS — E46 Unspecified protein-calorie malnutrition: Secondary | ICD-10-CM | POA: Diagnosis not present

## 2023-04-11 DIAGNOSIS — R350 Frequency of micturition: Secondary | ICD-10-CM | POA: Diagnosis not present

## 2023-04-11 DIAGNOSIS — E039 Hypothyroidism, unspecified: Secondary | ICD-10-CM

## 2023-04-11 DIAGNOSIS — R5383 Other fatigue: Secondary | ICD-10-CM

## 2023-04-11 LAB — POCT URINALYSIS DIP (PROADVANTAGE DEVICE)
Bilirubin, UA: NEGATIVE
Glucose, UA: NEGATIVE mg/dL
Ketones, POC UA: NEGATIVE mg/dL
Nitrite, UA: NEGATIVE
Protein Ur, POC: NEGATIVE mg/dL
Specific Gravity, Urine: 1.015
Urobilinogen, Ur: 0.2
pH, UA: 5 (ref 5.0–8.0)

## 2023-04-11 NOTE — Progress Notes (Signed)
Chief Complaint  Patient presents with   Urinary Frequency    Has had urinary frequency x 1 week and once in a while burns. Had diarrhea all day Monday. Did eat some chicken soup that her son in law made Sunday, wonders if it made her sick.   Monday 9/16 she had 10 BM's (solid at first, diarrhea by the end). She had 3 episodes of fecal incontinence. She reports she got cleaned up very quickly. She was fatigued yesterday. She was drinking a lot of water and voiding frequently. Last stool was diarrhea on Monday evening, none since.  Off and on she has had some burning with voiding. It is intermittent, and did not notice any discomfort with urination today. Today she feels exhausted.    PMH, PSH, SH reviewed  Outpatient Encounter Medications as of 04/11/2023  Medication Sig Note   ALPRAZolam (XANAX) 0.5 MG tablet TAKE FOUR TABLETS BY MOUTH DAILY AS NEEDED FOR ANXIETY 04/11/2023: Took one this am   Carbidopa-Levodopa ER (SINEMET CR) 25-100 MG tablet controlled release TAKE ONE TABLET THREE TIMES DAILY    SYNTHROID 25 MCG tablet TAKE 1 TABLET DAILY MONDAY THROUGH SATURDAY, SKIP SUNDAY FOR HYPOTHYROIDISM    b complex vitamins capsule Take 1 capsule by mouth daily. (Patient not taking: Reported on 04/11/2023) 04/11/2023: Has not taken in 2 days   dicyclomine (BENTYL) 10 MG capsule TAKE 1 CAPSULE BY MOUTH EVERY 6 HOURS AS NEEDED (Patient not taking: Reported on 11/09/2022) 04/11/2023: As needed   polyethylene glycol (MIRALAX) 17 g packet Take 17 g by mouth daily as needed. (Patient not taking: Reported on 04/11/2023)    [DISCONTINUED] Cholecalciferol (VITAMIN D) 50 MCG (2000 UT) CAPS Take 1 capsule by mouth daily.    No facility-administered encounter medications on file as of 04/11/2023.   Allergies  Allergen Reactions   Iodinated Contrast Media Anaphylaxis   Iodine Anaphylaxis    IV and topical forms. Other reaction(s): Unknown   Levsin [Hyoscyamine Sulfate]     Vision problems/pt has glaucoma    Salmon [Fish Allergy] Hives and Shortness Of Breath   Shellfish Allergy Anaphylaxis   Tramadol Swelling   Remeron [Mirtazapine] Other (See Comments)    Cause blurred vision and red eyes, pt has glaucoma   Aspirin Other (See Comments)    Sever stomach pain due to ulcer scaring.   Bis Subcit-Metronid-Tetracyc Swelling    Tongue swelling. Face tingling Other reaction(s): Unknown   Cephalexin Hives    Other reaction(s): hives   Ciprofloxacin Diarrhea   Codeine Nausea And Vomiting   Cyclobenzaprine Other (See Comments)    Tingly/prickly sensation. Other reaction(s): tingly/prickly sensation   Darvocet [Propoxyphene N-Acetaminophen] Nausea And Vomiting   Demerol [Meperidine] Nausea Only   Dexlansoprazole Swelling    Redness, swelling and peeling of both feet. Other reaction(s): foot pain   Diphedryl [Diphenhydramine] Other (See Comments)    Increased pulse/small amount ok   Doxycycline Hyclate Other (See Comments)    GI intolerance.   Doxycycline Hyclate     Other reaction(s): GI intolerance   Epinephrine Other (See Comments)    Breathing problems Other reaction(s): breathing problems/fainting   Erythromycin Other (See Comments)    GI intolerance. Other reaction(s): GI   Fish Oil     Other reaction(s): breathing problems/hives   Hyoscyamine     Other reaction(s): eye pain   Latex Other (See Comments)    Gloves ok.  Skin gets red from elastic in underwear and latex bandaides.   Meperidine Hcl  Other reaction(s): vomiting   Nitrofurantoin Diarrhea   Other     Other reaction(s): migraines Other reaction(s): Unknown Other reaction(s): Unknown Other reaction(s): Unknown Other reaction(s): increased pulse, faint, diarrhea   Prednisone Other (See Comments)    Headache Other reaction(s): headache   Ra Diphedryl Allergy [Diphenhydramine Hcl]     Other reaction(s): increased pulse small dose okay   Sertraline Hcl Swelling and Other (See Comments)    Migraine Swelling of  tongue/lip (09/2012) Other reaction(s): Unknown   Shellfish-Derived Products     Other reaction(s): Unknown   Sulfa Antibiotics Other (See Comments)    Increased pulse, fainting, diarrhea, thrush   Wellbutrin [Bupropion] Other (See Comments)    Headaches   Xylocaine [Lidocaine Hcl]     With epinephrine, given by dentist.  Speeded up heart rate and she passed out (occured twice, at dentist)   Xylocaine [Lidocaine]     Other reaction(s): Unknown   Biaxin [Clarithromycin] Rash    Started after completing 10 day course of 2000 mg /day, Lips swelling   Ibuprofen Other (See Comments)    Motrin ok with a GI effect. Other reaction(s): rash Motrin okay with a GI effect   ROS:  Still has some L sided pain (abdominal, possibly also muscular, due to pushing herself up onto her bed). Some chills, no fever No nausea or vomiting. Intermittent bloating and nausea. Intermittent burning with urination. No hematuria, odor, incontinence. Diarrhea earlier this week, resolved.  Getting a tooth pulled tomorrow, somewhat anxious about this.  PHYSICAL EXAM:  BP 132/68   Pulse 72   Temp 98.9 F (37.2 C) (Tympanic)   Ht 4\' 11"  (1.499 m)   Wt 83 lb 9.6 oz (37.9 kg)   LMP  (LMP Unknown)   BMI 16.89 kg/m   Wt Readings from Last 3 Encounters:  04/11/23 83 lb 9.6 oz (37.9 kg)  03/19/23 85 lb 6.4 oz (38.7 kg)  02/26/23 88 lb (39.9 kg)   Frail, mildly anxious female, in no distress HEENT: conjunctiva and sclera are clear, EOMI Neck: no lymphadenopathy or mass Heart: regular rate and rhythm Lungs: clear bilaterally Back: no CVA tenderness Abdomen: no suprapubic tenderness. Mildly tender on L, no rebound or guarding.  Active bowel sounds Extremities: no edema Neuro: alert and oriented.  Some tremor of hands noted.  Normal, slow gait. Psych: slightly anxious, not seeming as depressed as at last visit.   Urine: SG 1.015, trace blood, trace leuks, negative nitrite,  protein   ASSESSMENT/PLAN:   Urinary frequency - frequent voiding related to hydrating/increased intake. No e/o infection. f/u if sx persist/worsen.  to discuss burning with GYN - Plan: POCT Urinalysis DIP (Proadvantage Device)  Diarrhea, unspecified type - acute illness 2 days ago, with incontinence; at risk for UTI but none found. Diarrhea resolved. Cont probiotics, proper hygiene  Protein-calorie malnutrition, unspecified severity (HCC) - some additional weight loss likely related to GI illness earlier this week. Advance diet as tolerated back to normal.  Continue to stay well hydrated. Continue the daily probiotic. You are doing a good job--you are not dehydrated. There doesn't appear to be any bladder infection. Please return if you have more persistent pain with urination, cloudy or smelly urine, blood in the urine, or worsening abdominal pain.  Discuss any ongoing bladder/urinary issues with Dr. Vincente Poli  (Menopausal issues can contribute to pain with urination not caused by infections).

## 2023-04-11 NOTE — Patient Instructions (Signed)
   Continue to stay well hydrated. Continue the daily probiotic. You are doing a good job--you are not dehydrated. There doesn't appear to be any bladder infection. Please return if you have more persistent pain with urination, cloudy or smelly urine, blood in the urine, or worsening abdominal pain.  Discuss any ongoing bladder/urinary issues with Dr. Vincente Poli  (Menopausal issues can contribute to pain with urination not caused by infections).

## 2023-04-16 ENCOUNTER — Telehealth: Payer: Self-pay | Admitting: Family Medicine

## 2023-04-16 NOTE — Telephone Encounter (Signed)
Pt called and states that for about 3 weeks now she has had a dry cough said she forgot to tell you the other day, said it bothers her some at night and when she walks around, said she wanted to inform you, and I tried to make her a appt said she did not want to come in,

## 2023-04-16 NOTE — Telephone Encounter (Signed)
Patient advised.

## 2023-04-16 NOTE — Telephone Encounter (Signed)
Let her know that her lungs were clear at her recent visit. She had mentioned having runny nose after the visit--of course postnasal drainage can contribute to cough. She should take an antihistamine (like claritin), if needed for runny nose, and mucinex or robitussin if needed for cough.

## 2023-04-18 ENCOUNTER — Ambulatory Visit: Payer: Medicare Other | Admitting: Family Medicine

## 2023-04-18 ENCOUNTER — Telehealth: Payer: Self-pay | Admitting: Family Medicine

## 2023-04-18 ENCOUNTER — Ambulatory Visit: Payer: Medicare Other | Admitting: Psychiatry

## 2023-04-18 DIAGNOSIS — F331 Major depressive disorder, recurrent, moderate: Secondary | ICD-10-CM

## 2023-04-18 NOTE — Telephone Encounter (Signed)
Diarrhea started this afternoon and wants to know what she can take She took 1 imodium about an hour ago and took Bentyl about an hour ago

## 2023-04-18 NOTE — Telephone Encounter (Signed)
She has already taken something. She should stay hydrated and not taking anything else. If she has recurrent significant diarrhea, she can take another dose of imodium. She should temporarily hold the miralax while having diarrhea.

## 2023-04-18 NOTE — Progress Notes (Signed)
[facility] Counselor/Therapist Progress Note  Patient ID: GURJOT THIELEMANN, MRN: [id number],    Date: 04/18/2023  Time Spent: 45 minutes   Treatment Type: Individual Therapy  Virtual Visit via Telehealth Note: Telephone session as patient not able to do the video Connected with patient by a telemedicine/telehealth application, with their informed consent, and verified patient privacy and that I am speaking with the correct person using two identifiers. I discussed the limitations, risks, security and privacy concerns of performing psychotherapy and the availability of in person appointments. I also discussed with the patient that there may be a patient responsible charge related to this service. The patient expressed understanding and agreed to proceed. I discussed the treatment planning with the patient. The patient was provided an opportunity to ask questions and all were answered. The patient agreed with the plan and demonstrated an understanding of the instructions. The patient was advised to call  our office if  symptoms worsen or feel they are in a crisis state and need immediate contact.   Therapist Location: office Patient Location: home   Reported Symptoms:  anxiety, depression  Mental Status Exam:  Appearance:   N/A telehealth      Behavior:  Sharing  Motor:  Uses walker in moving about  Speech/Language:   Clear and Coherent  Affect:  N/a  telehealth  Mood:  anxious, depressed, and sad  Thought process:  goal directed  Thought content:    Rumination  Sensory/Perceptual disturbances:    WNL  Orientation:  oriented to person, place, time/date, situation, day of week, month of year, year, and stated date of Sept. 25, 2024  Attention:  Fair  Concentration:  Good and Fair  Memory:  WNL  Fund of knowledge:   Good  Insight:    Good and Fair  Judgment:   Good  Impulse Control:  Good   Risk Assessment: Danger to Self:  No Self-injurious Behavior: No Danger to Others:  No Duty to Warn:no Physical Aggression / Violence:No  Access to Firearms a concern: No  Gang Involvement:No   Subjective:    Patient participating well in virtual session today and working further on her anxiety, frustration with continued medical issues.  Today needed session to discuss her medical issues further and she became very tearful just needing to be heard and encouraged.  Feeling very "tired from all the medical issues".  No SI.  She has shown some progress but feels like more recently it has been 1 discouragement after another.  Her husband is also having increasing health concerns which she discussed also today.  Their son is coming to visit tomorrow and that is a real positive for patient and husband.  Due to some personal issues, he has not been able to come visit from out of state for a while, so patient is very happy about this upcoming visit and shared a lot about son and his plans to be here for about a week and then return home.  Did respond well to the support in session today and was able to and on a more encouraging note especially and looking forward to sons visit starting tomorrow.    Interventions: Cognitive Behavioral Therapy and Ego-Supportive  Long term goal: Develop the ability to recognize, accept, and cope with feelings of anxiety and depression. Short term goal: Verbalize any unresolved grief issues that may be contributing to anxiety and depression. Strategy: Replace negative self-defeating self talk with verbalization of realistic and positive cognitive messages  to lessen depression/anxiety and improve mood.   Diagnosis:   ICD-10-CM   1. Major depressive disorder, recurrent episode, moderate (HCC)  F33.1      Plan:  Patient today showing good participation in session working on her sadness, grief, and tiredness and dealing with some many health issues.  She did seem more elevated in mood by the end of session and is looking forward to tomorrow's visit from her  son who lives out of state and she has not seen him in quite a while.  Patient has made progress and needs to continue working with goal-directed behaviors in order to keep moving in a more positive direction. Reminded and encouraged patient to be practicing more positive/self affirming behaviors and thoughts including: Staying on her prescribed medication, refrain from assuming worst-case scenarios, use the "getting centered" list that we worked on in her previous sessions to help her when she is more anxious or frustrated, remember her positives, reach out to extended family via phone calls, listen to her music that she loves and finds calming, positive self talk, get outside of her house occasionally as she is able to with help, and realize the strength she shows when working with goal-directed behaviors to move in a direction that supports her improved emotional health and outlook into the future.  Goal review and progress/challenges noted with patient.  Next appointment within 3 weeks.   Mathis Fare, LCSW

## 2023-04-18 NOTE — Telephone Encounter (Signed)
Left message for patient with these instructions.

## 2023-04-19 ENCOUNTER — Encounter: Payer: Self-pay | Admitting: Family Medicine

## 2023-04-19 ENCOUNTER — Ambulatory Visit: Payer: Medicare Other | Admitting: Family Medicine

## 2023-04-19 VITALS — BP 138/80 | HR 84 | Temp 98.6°F | Ht 59.0 in | Wt 82.6 lb

## 2023-04-19 DIAGNOSIS — M545 Low back pain, unspecified: Secondary | ICD-10-CM | POA: Diagnosis not present

## 2023-04-19 DIAGNOSIS — R059 Cough, unspecified: Secondary | ICD-10-CM

## 2023-04-19 DIAGNOSIS — R630 Anorexia: Secondary | ICD-10-CM | POA: Diagnosis not present

## 2023-04-19 DIAGNOSIS — R1013 Epigastric pain: Secondary | ICD-10-CM | POA: Diagnosis not present

## 2023-04-19 DIAGNOSIS — K582 Mixed irritable bowel syndrome: Secondary | ICD-10-CM

## 2023-04-19 LAB — POC COVID19 BINAXNOW: SARS Coronavirus 2 Ag: NEGATIVE

## 2023-04-19 LAB — POCT INFLUENZA A/B
Influenza A, POC: NEGATIVE
Influenza B, POC: NEGATIVE

## 2023-04-19 NOTE — Patient Instructions (Signed)
Be sure to stay very well hydrated (aim for very pale urine). I suspect the brown phlegm is related to postnasal drainage, and possibly some residual bleedng from your recent tooth extraction.  I don't see any evidence of infection. Your flu and COVID tests were negative.  Given the soreness in your upper stomach, I recommend that you restart taking omeprazole once daily. Take it before breakfast. Be sure to eat enough food, or you will continue to lose weight! Continue with protein shakes/supplements, and eating small amounts frequently during the day. Continue to take your probiotic daily, and stick with a bland diet (nothing fried/greasy, continue to avoid dairy).

## 2023-04-19 NOTE — Telephone Encounter (Signed)
Patient could not be here for the 1:30 visit

## 2023-04-19 NOTE — Progress Notes (Signed)
Chief Complaint  Patient presents with   Extremity Weakness    Legs feel weak and when she wakes up in the am her throat is full of brown stuff. Did covid test Monday and it was negative. Diarrhea is better. Left sided abd burning and pain.    Patient presents with complaint of having thick brown mucus in her throat every morning. She denies any phlegm later in the day, just first thing in the morning. She had reported headache and runny nose within the week as well, in addition to diarrhea. Her son is coming home today, hasn't seen him in 5 years. She is anxious about this, doesn't feel well. She also has ongoing pain on her L side, near her ribs.   9/20 she had headache and runny nose. She had negative COVID test on 9/20. Took another test since then, feels worse now. She had diarrhea on 9/20. She had some loose stool intermittently.  She had more diarrhea yesterday x 3 episodes. She last took Miralax 9/22. No BM yet today. She has some upper abdominal pain, and LUQ pain. She is afraid to eat.  Sometimes it hurts to eat (epigastric). She hasn't taken any omeprazole--she took some over a week ago, but none in the last 4 days. Stopped it when she felt better.  She had tooth extraction 9/19. She still notes some blood on gauze off and on. Denies pain.    PMH, PSH, SH reviewed  Outpatient Encounter Medications as of 04/19/2023  Medication Sig Note   ALPRAZolam (XANAX) 0.5 MG tablet TAKE FOUR TABLETS BY MOUTH DAILY AS NEEDED FOR ANXIETY 04/19/2023: Took one this am   b complex vitamins capsule Take 1 capsule by mouth daily. (Patient not taking: Reported on 04/11/2023) 04/19/2023: Did not take today   Carbidopa-Levodopa ER (SINEMET CR) 25-100 MG tablet controlled release TAKE ONE TABLET THREE TIMES DAILY (Patient not taking: Reported on 04/19/2023) 04/19/2023: Did not take today   dicyclomine (BENTYL) 10 MG capsule TAKE 1 CAPSULE BY MOUTH EVERY 6 HOURS AS NEEDED (Patient not taking: Reported on  11/09/2022) 04/11/2023: As needed   polyethylene glycol (MIRALAX) 17 g packet Take 17 g by mouth daily as needed. (Patient not taking: Reported on 04/11/2023)    SYNTHROID 25 MCG tablet TAKE 1 TABLET DAILY MONDAY THROUGH SATURDAY, SKIP SUNDAY FOR HYPOTHYROIDISM (Patient not taking: Reported on 04/19/2023) 04/19/2023: Did not take today   No facility-administered encounter medications on file as of 04/19/2023.   Allergies  Allergen Reactions   Iodinated Contrast Media Anaphylaxis   Iodine Anaphylaxis    IV and topical forms. Other reaction(s): Unknown   Levsin [Hyoscyamine Sulfate]     Vision problems/pt has glaucoma   Salmon [Fish Allergy] Hives and Shortness Of Breath   Shellfish Allergy Anaphylaxis   Tramadol Swelling   Remeron [Mirtazapine] Other (See Comments)    Cause blurred vision and red eyes, pt has glaucoma   Aspirin Other (See Comments)    Sever stomach pain due to ulcer scaring.   Bis Subcit-Metronid-Tetracyc Swelling    Tongue swelling. Face tingling Other reaction(s): Unknown   Cephalexin Hives    Other reaction(s): hives   Ciprofloxacin Diarrhea   Codeine Nausea And Vomiting   Cyclobenzaprine Other (See Comments)    Tingly/prickly sensation. Other reaction(s): tingly/prickly sensation   Darvocet [Propoxyphene N-Acetaminophen] Nausea And Vomiting   Demerol [Meperidine] Nausea Only   Dexlansoprazole Swelling    Redness, swelling and peeling of both feet. Other reaction(s): foot pain  Diphedryl [Diphenhydramine] Other (See Comments)    Increased pulse/small amount ok   Doxycycline Hyclate Other (See Comments)    GI intolerance.   Doxycycline Hyclate     Other reaction(s): GI intolerance   Epinephrine Other (See Comments)    Breathing problems Other reaction(s): breathing problems/fainting   Erythromycin Other (See Comments)    GI intolerance. Other reaction(s): GI   Fish Oil     Other reaction(s): breathing problems/hives   Hyoscyamine     Other reaction(s):  eye pain   Latex Other (See Comments)    Gloves ok.  Skin gets red from elastic in underwear and latex bandaides.   Meperidine Hcl     Other reaction(s): vomiting   Nitrofurantoin Diarrhea   Other     Other reaction(s): migraines Other reaction(s): Unknown Other reaction(s): Unknown Other reaction(s): Unknown Other reaction(s): increased pulse, faint, diarrhea   Prednisone Other (See Comments)    Headache Other reaction(s): headache   Ra Diphedryl Allergy [Diphenhydramine Hcl]     Other reaction(s): increased pulse small dose okay   Sertraline Hcl Swelling and Other (See Comments)    Migraine Swelling of tongue/lip (09/2012) Other reaction(s): Unknown   Shellfish-Derived Products     Other reaction(s): Unknown   Sulfa Antibiotics Other (See Comments)    Increased pulse, fainting, diarrhea, thrush   Wellbutrin [Bupropion] Other (See Comments)    Headaches   Xylocaine [Lidocaine Hcl]     With epinephrine, given by dentist.  Speeded up heart rate and she passed out (occured twice, at dentist)   Xylocaine [Lidocaine]     Other reaction(s): Unknown   Biaxin [Clarithromycin] Rash    Started after completing 10 day course of 2000 mg /day, Lips swelling   Ibuprofen Other (See Comments)    Motrin ok with a GI effect. Other reaction(s): rash Motrin okay with a GI effect    ROS: No known fever, no chills. Some nausea, decreased appetite, intermittent diarrhea, epigastric discomfort per HPI. No blood in the stool Runny nose improved, but now has nasal crusting. Sneezed x 5-6 days, resolved. No sore throat. Slight HA across forehead. Denies dysuria, urgency/frequency. Not voiding as often, not getting up at night like she usually does. +weight loss noted.   PHYSICAL EXAM:  BP 138/80   Pulse 84   Temp 98.6 F (37 C) (Tympanic)   Ht 4\' 11"  (1.499 m)   Wt 82 lb 9.6 oz (37.5 kg)   LMP  (LMP Unknown)   BMI 16.68 kg/m   Wt Readings from Last 3 Encounters:  04/19/23 82 lb  9.6 oz (37.5 kg)  04/11/23 83 lb 9.6 oz (37.9 kg)  03/19/23 85 lb 6.4 oz (38.7 kg)   Frail, moaning with movements (mainly related to back pain with position changes, chronic). HEENT: conjunctiva and sclera are clear, EOMI.  TM's and EAc's normal. Nasal mucosa--mild edema on the left, pale, no drainage. Sinuses are nontender. OP--tooth removed from R lower posterior jaw.  No active bleeding, tissue appears normal.  OP is clear. No erythema or lesions Neck:no lymphadenopathy or mass Heart: regular rate and rhythm, no murmur or ectopy noted Lungs: clear bilaterally Abdomen: soft, active bowel sounds.  Tender in epigastrium. Mildly tender at L abdomen (entire left side)--mild, no mass, rebound or guarding.  More tender over the inferior ribs on the left. Extremities: no edema Back: no spinal or CVA tenderness.  Tender at L SI joint. No muscle spasm. Psych: depressed, anxious.  Normal eye contact, speech, hygiene  and grooming Neuro: alert and oriented. Cranial nerves and strength intact. Tremor of hands noted, unchanged.   Influenza and COVID tests negative    ASSESSMENT/PLAN:  Cough, unspecified type - her concern is brown phlegm in mornings--suspect related to PND, poss some bleeding from recent extraction. Reassured no e/o infection or other problem - Plan: POC COVID-19, Influenza A/B  Epigastric pain - unclear etiology; decreased appetite. Encouraged her to resume omeprazole daily until resolved. L sided pain is chronic. +MSK tenderness at ribs  Decreased appetite - pt with significant depression, IBS with recent diarrhea, and epigastric tenderness.  To resume omeprazole, and try and eat frequently  Irritable bowel syndrome with both constipation and diarrhea - recent diarrhea, resolved. Suspect that stress/anxiety over son coming home contributes. Cont bland diet, frequent meals to avoid weight loss  Low back pain, unspecified back pain laterality, unspecified chronicity,  unspecified whether sciatica present - tender at L SI joint. Chronic. Injections didn't help, denies any benefit from topical meds   Anxiety/depression-- She is worried that her son will not like how she looks, be worried/disappointed. Counseled. Encouraged laughter, reminiscing, looking through photo albums. She has nothing to be ashamed of--almost 89 years old. Hasn't seen him in 5 years, okay if she looks older!  I spent 40 minutes dedicated to the care of this patient, including pre-visit review of records, face to face time, post-visit ordering of testing and documentation.  Be sure to stay very well hydrated (aim for very pale urine). I suspect the brown phlegm is related to postnasal drainage, and possibly some residual bleedng from your recent tooth extraction.  I don't see any evidence of infection. Your flu and COVID tests were negative.  Given the soreness in your upper stomach, I recommend that you restart taking omeprazole once daily. Take it before breakfast. Be sure to eat enough food, or you will continue to lose weight! Continue with protein shakes/supplements, and eating small amounts frequently during the day. Continue to take your probiotic daily, and stick with a bland diet (nothing fried/greasy, continue to avoid dairy).

## 2023-04-20 ENCOUNTER — Other Ambulatory Visit: Payer: Self-pay | Admitting: Adult Health

## 2023-04-20 DIAGNOSIS — F411 Generalized anxiety disorder: Secondary | ICD-10-CM

## 2023-04-25 ENCOUNTER — Telehealth: Payer: Self-pay | Admitting: Family Medicine

## 2023-04-25 NOTE — Telephone Encounter (Signed)
An x-ray is not an appropriate test for upper abdominal pain. Her exam was fairly benign at last visit, no indication for CT (she has chronic issues with pain (had CT a year ago for same pain as at her visit last week).  Ensure that she is taking omeprazole daily

## 2023-04-25 NOTE — Telephone Encounter (Signed)
Tracy Malone called and states she is still having bad upper abdominal pain that has caused extreme nausea. She wants to go have an xray done particular at [facility], she expressed it is making her miserable.

## 2023-04-25 NOTE — Telephone Encounter (Signed)
Spoke with patient and she is taking omeprazole daily. Gave her Dr. Delford Field recommendations. She did say she was feeling much better.

## 2023-05-01 ENCOUNTER — Other Ambulatory Visit: Payer: Medicare Other

## 2023-05-01 ENCOUNTER — Ambulatory Visit: Payer: Medicare Other | Admitting: Psychiatry

## 2023-05-01 DIAGNOSIS — F331 Major depressive disorder, recurrent, moderate: Secondary | ICD-10-CM | POA: Diagnosis not present

## 2023-05-01 DIAGNOSIS — R197 Diarrhea, unspecified: Secondary | ICD-10-CM | POA: Diagnosis not present

## 2023-05-01 DIAGNOSIS — R5383 Other fatigue: Secondary | ICD-10-CM | POA: Diagnosis not present

## 2023-05-01 DIAGNOSIS — E039 Hypothyroidism, unspecified: Secondary | ICD-10-CM | POA: Diagnosis not present

## 2023-05-01 NOTE — Progress Notes (Signed)
[facility] Counselor/Therapist Progress Note  Patient ID: Tracy Malone, MRN: [id number],    Date: 05/01/2023  Time Spent: 45 minutes  Treatment Type: Individual Therapy  Virtual Visit via Telehealth Note: Telephone session as patient not able to do the video Connected with patient by a telemedicine/telehealth application, with their informed consent, and verified patient privacy and that I am speaking with the correct person using two identifiers. I discussed the limitations, risks, security and privacy concerns of performing psychotherapy and the availability of in person appointments. I also discussed with the patient that there may be a patient responsible charge related to this service. The patient expressed understanding and agreed to proceed. I discussed the treatment planning with the patient. The patient was provided an opportunity to ask questions and all were answered. The patient agreed with the plan and demonstrated an understanding of the instructions. The patient was advised to call  our office if  symptoms worsen or feel they are in a crisis state and need immediate contact.   Therapist Location: office Patient Location: home   Reported Symptoms: depression, anxiety, sad re: husband's health   Mental Status Exam:  Appearance:   N/a- telehealth      Behavior:  Appropriate and Sharing  Motor:  Challenges in motor skills and "getting up out of bed"  Speech/Language:   Clear and Coherent  Affect:  N/a  telehealth  Mood:  anxious and depressed  Thought process:  goal directed  Thought content:    Rumination  Sensory/Perceptual disturbances:    WNL  Orientation:  oriented to person, place, time/date, situation, day of week, month of year, year, and stated date of Oct. 8, 2024  Attention:  Good  Concentration:  Good  Memory:  WNL  Fund of knowledge:   Good  Insight:    Good and Fair  Judgment:   Good  Impulse Control:  Good   Risk Assessment: Danger to Self:   No Self-injurious Behavior: No Danger to Others: No Duty to Warn:no Physical Aggression / Violence:No  Access to Firearms a concern: No  Gang Involvement:No   Subjective:  Patient participating well in session today although "not much good news". States she's not feeling well physically and is challenged with moving about and doing things at her home.  Depressed but no thoughts of self-harm.  Talked at length about her depression and she is going to be in contact with her med provider about resuming her taking of Prozac.  Patient needed session today to express her depression and anxiety, along with her tearfulness and concerns about the health of herself and her husband as they age and face more challenges.  Is definitely want to her home as long as possible rather than go to any type of retirement facility.  Has a daughter living locally and her son from out of state visited last week which she particularly enjoyed.  Patient states that she tires more easily and never quite knows how she is going to feel from 1 day to another with energy and motivation.  Continues to deny any thoughts of self-harm.  3 she hated being able to vent freely her concerns today in session and seemed to appreciate the support.  Was calmer although still tired as we ended session.  Interventions: Cognitive Behavioral Therapy and Ego-Supportive  Long term goal: Develop the ability to recognize, accept, and cope with feelings of anxiety and depression. Short term goal: Verbalize any unresolved grief issues that may  be contributing to anxiety and depression. Strategy: Replace negative self-defeating self talk with verbalization of realistic and positive cognitive messages to lessen depression/anxiety and improve mood.  Diagnosis:   ICD-10-CM   1. Major depressive disorder, recurrent episode, moderate (HCC)  F33.1      Plan:  Patient today showing good motivation and participation in session, although was feeling more  tired as her energy level was down.  States that this happens with her off and on throughout the week where sometimes she has more energy than other times.  Was able to focus on and express her feelings about the challenges she and her husband face and they are aging as they are both in their 32s and have health issues.  They are hopeful to remain in their own home as long as possible.  They do have a daughter that lives locally and checks in on them.  They are also connected to their church where they have received support.  In spite of her challenges, patient has made progress in therapy and needs to continue working with goal-directed behaviors to keep moving in a direction that helps support her best emotional health possible. Reminded and encouraged patient to be practicing more positive/self affirming behaviors and thoughts including: Staying on her prescribed medication, refrain from assuming worst-case scenarios, use the "getting centered" list that we worked on in her previous sessions to help her when she is more anxious or frustrated, remember her positives, reach out to extended family via phone calls, listen to her music that she loves and finds calming, positive self talk, get outside of her house occasionally as she is able with help, and realize the strength she shows when working with goal-directed behaviors to move in a direction that supports her improved emotional health and overall wellbeing.  Goal review and progress/challenges noted with patient.  Next appointment within 3 weeks.   Mathis Fare, LCSW

## 2023-05-02 LAB — CBC WITH DIFFERENTIAL/PLATELET
Basophils Absolute: 0.1 10*3/uL (ref 0.0–0.2)
Basos: 1 %
EOS (ABSOLUTE): 0.1 10*3/uL (ref 0.0–0.4)
Eos: 2 %
Hematocrit: 44 % (ref 34.0–46.6)
Hemoglobin: 14.4 g/dL (ref 11.1–15.9)
Immature Grans (Abs): 0 10*3/uL (ref 0.0–0.1)
Immature Granulocytes: 0 %
Lymphocytes Absolute: 2.3 10*3/uL (ref 0.7–3.1)
Lymphs: 32 %
MCH: 31.1 pg (ref 26.6–33.0)
MCHC: 32.7 g/dL (ref 31.5–35.7)
MCV: 95 fL (ref 79–97)
Monocytes Absolute: 0.7 10*3/uL (ref 0.1–0.9)
Monocytes: 10 %
Neutrophils Absolute: 3.9 10*3/uL (ref 1.4–7.0)
Neutrophils: 55 %
Platelets: 249 10*3/uL (ref 150–450)
RBC: 4.63 x10E6/uL (ref 3.77–5.28)
RDW: 12.2 % (ref 11.7–15.4)
WBC: 7 10*3/uL (ref 3.4–10.8)

## 2023-05-02 LAB — COMPREHENSIVE METABOLIC PANEL
ALT: 13 IU/L (ref 0–32)
AST: 23 IU/L (ref 0–40)
Albumin: 4.2 g/dL (ref 3.7–4.7)
Alkaline Phosphatase: 68 IU/L (ref 44–121)
BUN/Creatinine Ratio: 23 (ref 12–28)
BUN: 14 mg/dL (ref 8–27)
Bilirubin Total: 0.8 mg/dL (ref 0.0–1.2)
CO2: 26 mmol/L (ref 20–29)
Calcium: 9.5 mg/dL (ref 8.7–10.3)
Chloride: 102 mmol/L (ref 96–106)
Creatinine, Ser: 0.6 mg/dL (ref 0.57–1.00)
Globulin, Total: 2.3 g/dL (ref 1.5–4.5)
Glucose: 89 mg/dL (ref 70–99)
Potassium: 3.9 mmol/L (ref 3.5–5.2)
Sodium: 142 mmol/L (ref 134–144)
Total Protein: 6.5 g/dL (ref 6.0–8.5)
eGFR: 86 mL/min/{1.73_m2} (ref >59)

## 2023-05-02 LAB — TSH: TSH: 1.75 u[IU]/mL (ref 0.450–4.500)

## 2023-05-03 ENCOUNTER — Telehealth: Payer: Self-pay | Admitting: Adult Health

## 2023-05-03 NOTE — Telephone Encounter (Signed)
Next visit is 05/10/23. Tracy Malone said she needs something else for her depression. She is not sleeping and exhausted. States that Prozac doesn't help. Can she get something else. Her phone number is [phone]. Pharmacy is:  [facility] - [place], [place] - [address]   Phone: [phone]  Fax: [phone]

## 2023-05-04 ENCOUNTER — Other Ambulatory Visit: Payer: Self-pay

## 2023-05-04 ENCOUNTER — Telehealth: Payer: Self-pay | Admitting: Nurse Practitioner

## 2023-05-04 ENCOUNTER — Telehealth: Payer: Self-pay | Admitting: Adult Health

## 2023-05-04 MED ORDER — FLUOXETINE HCL 10 MG PO TABS: 10.0000 mg | ORAL_TABLET | Freq: Every day | ORAL | 0 refills | Status: DC

## 2023-05-04 MED ORDER — HYDROXYZINE HCL 10 MG PO TABS: 10.0000 mg | ORAL_TABLET | Freq: Every day | ORAL | 0 refills | Status: DC

## 2023-05-04 NOTE — Telephone Encounter (Signed)
Patient is asking for something for sleep. She gets to sleep, but only sleeping 3-4 hours and unable to get back to sleep. Denies any caffeine. She has previously been on hydroxyzine 10 mg and trazodone 50 mg. She is also asking to restart Prozac. I told her that she had been off and on it several times. She will request it and then wants to stop due to stomach pain. She said her stomach hurts anyway and she is getting worse and worse and needs something. She has an appt with Deb on 10/16 and you on 10/17.

## 2023-05-04 NOTE — Telephone Encounter (Signed)
Inbound call from patient following up on note below. Please advise.

## 2023-05-04 NOTE — Telephone Encounter (Signed)
Scripts sent, patient notified

## 2023-05-04 NOTE — Telephone Encounter (Signed)
Patient sent MyChart message requesting appointment with [facility].  Scheduled for 08/06/22 at 2:30 p.m.  Patient is complaining of nausea, diarrhea, losing weight, with no appetite.  She would like to know what she can do between now and her visit.  Please call patient and advise.  Thank you.

## 2023-05-04 NOTE — Telephone Encounter (Signed)
Pt lvm that she wants to talk to gina. She said that she is not sleeping and needs something for sleep, She also wants to talk about prozac. Please give her a call at [phone]

## 2023-05-04 NOTE — Telephone Encounter (Signed)
Patient states that over the last several months, she has been experiencing general GI upset. Has multiple episodes of mucous with diarrhea daily alternating with soft stool, followed by several days of constipation. She denies any melena of hematochezia. Patient states that she has abdominal swelling along with generalized abdominal discomfort, though not painful. She has loss of appetite and has lost about 10 pounds within a 3 month period. Does note some nausea but denies any vomiting. She also says she has had some reflux. She says that over the last few days, she has taken omeprazole 20 mg daily but she had not previously been taking this on a regular basis. Also notes that she uses dicyclomine for abdominal discomfort which is helpful after about 1 hour after taking the medicine. Patient states "Im about to give up. I just dont know what else to do."  Patient has been scheduled for an appointment with Quentin Mulling, PA-C on 05/09/23 at 11 am for further evaluation. Patient verbalizes understanding of this information.

## 2023-05-07 ENCOUNTER — Encounter: Payer: Medicare Other | Admitting: Family Medicine

## 2023-05-07 ENCOUNTER — Telehealth: Payer: Self-pay | Admitting: Psychiatry

## 2023-05-07 NOTE — Telephone Encounter (Signed)
Tracy Malone called and LM at 3:56 reporting that her medications was sent to [facility].  The pharmacist at [facility] pick up the iodine that is in the medication and red flagged it due to the iodine. Also the pills make her very nauseous and gives her an extreme headache.  She wants to only have the capsules of this medication.  She will talk with you about this in appt 10/17 but wanted you to be aware.  She never indicated which medication she was talking about

## 2023-05-08 NOTE — Telephone Encounter (Signed)
She states the hydroxyzine has red dye in it and she is allergic. She has tried Prozac multiple times. Last Rx was tablets and she said they gave her a bad headache. She has recently had tablets without any complaints other than the stomach issues. She has FU this week and will discuss at that time.

## 2023-05-09 ENCOUNTER — Ambulatory Visit: Payer: Medicare Other | Admitting: Physician Assistant

## 2023-05-09 ENCOUNTER — Other Ambulatory Visit: Payer: Self-pay | Admitting: Adult Health

## 2023-05-09 ENCOUNTER — Ambulatory Visit: Payer: Medicare Other | Admitting: Psychiatry

## 2023-05-09 DIAGNOSIS — F331 Major depressive disorder, recurrent, moderate: Secondary | ICD-10-CM | POA: Diagnosis not present

## 2023-05-09 DIAGNOSIS — F411 Generalized anxiety disorder: Secondary | ICD-10-CM

## 2023-05-09 NOTE — Telephone Encounter (Signed)
Has appt. tomorrow

## 2023-05-09 NOTE — Progress Notes (Signed)
[facility] Counselor/Therapist Progress Note  Patient ID: Tracy Malone, MRN: [id number],    Date: 05/09/2023  Time Spent: 45 minutes   Treatment Type: Individual Therapy  Virtual Visit via Telehealth Note: Telephone session, as patient is not physically able to do the video session Connected with patient by a telemedicine/telehealth application, with their informed consent, and verified patient privacy and that I am speaking with the correct person using two identifiers. I discussed the limitations, risks, security and privacy concerns of performing psychotherapy and the availability of in person appointments. I also discussed with the patient that there may be a patient responsible charge related to this service. The patient expressed understanding and agreed to proceed. I discussed the treatment planning with the patient. The patient was provided an opportunity to ask questions and all were answered. The patient agreed with the plan and demonstrated an understanding of the instructions. The patient was advised to call  our office if  symptoms worsen or feel they are in a crisis state and need immediate contact.   Therapist Location: office Patient Location: home  Reported Symptoms: depression, No SI, anxiety, some tearfulness  Mental Status Exam:  Appearance:   N/a   telehealth appt      Behavior:  Appropriate and Sharing  Motor:  Uses cane and rollater in walking  Speech/Language:   Clear and Coherent  Affect:  N/a   telehealth appt  Mood:  anxious and depressed  Thought process:  goal directed  Thought content:    Rumination  Sensory/Perceptual disturbances:    WNL  Orientation:  oriented to person, place, time/date, situation, day of week, month of year, year, and stated date of Oct. 16, 2024  Attention:  Fair  Concentration:  Fair  Memory:  WNL  Fund of knowledge:   Good  Insight:    Good and Fair  Judgment:   Good and Fair  Impulse Control:  Good   Risk  Assessment: Danger to Self:  No Self-injurious Behavior: No Danger to Others: No Duty to Warn:no Physical Aggression / Violence:No  Access to Firearms a concern: No  Gang Involvement:No   Subjective: Patient reporting more depression today and  needed session mostly to process her depressed and anxious feelings related to her physical and emotional health, and her family. Denies any SI. Frustrated, not sure her meds are helping, tearful off and on. Someone from palliative care is checking on her today and husband is with her also. Questions her medication and is to talk with her back doctor soon and also her med provider tomorrow at our office. Venting freely her concerns today and did seem to be calmer by end of session although still "not feeling well" due to physical issues "affecting my feelings."  Encouraged her and patient agreed to try to lessen her self-defeating self talk as that always tends to worsen depression and anxiety.  Interventions: Cognitive Behavioral Therapy and Ego-Supportive  Long term goal: Develop the ability to recognize, accept, and cope with feelings of anxiety and depression. Short term goal: Verbalize any unresolved grief issues that may be contributing to anxiety and depression. Strategy: Replace negative self-defeating self talk with verbalization of realistic and positive cognitive messages to lessen depression/anxiety and improve mood.  Diagnosis:   ICD-10-CM   1. Major depressive disorder, recurrent episode, moderate (HCC)  F33.1      Plan:  Patient today participating well in session although very frustrated as she said she was "depressed again and  did not feel her medications were working".  Is being checked on through palliative care and her husband is also present with her.  She denies any thoughts of harming herself or anyone else.  States that she does have an appointment tomorrow with her med provider here at our office.  Has a daughter that lives  locally and checks in on her and is also connected somewhat with her church who has reached out to patient and husband as well.  Patient has made progress over time and needs to continue working with goal-directed behaviors to move in a more hopeful and healthier direction is much as possible. Reminded and encouraged patient to be practicing more positive/self affirming behaviors and thoughts including: Staying on her prescribed medication, refrain from assuming worst-case scenarios, use the "getting centered" list that we worked on in her previous sessions to help her when she is more anxious or frustrated, remember her positives, reach out to her extended family via phone calls, listen to her music that she loves and finds calming, positive self talk, get outside of her house occasionally as she is able with help, and recognize the strength she shows when working with goal-directed behaviors to move in a direction that supports her improved emotional health and her overall outlook into the future.  Goal review and progress/challenges noted with patient.  Next appointment within 2 to 3 weeks.   Mathis Fare, LCSW

## 2023-05-10 ENCOUNTER — Telehealth: Payer: Self-pay | Admitting: Neurology

## 2023-05-10 ENCOUNTER — Ambulatory Visit: Payer: Medicare Other | Admitting: Adult Health

## 2023-05-10 ENCOUNTER — Encounter: Payer: Self-pay | Admitting: Adult Health

## 2023-05-10 DIAGNOSIS — F411 Generalized anxiety disorder: Secondary | ICD-10-CM | POA: Diagnosis not present

## 2023-05-10 DIAGNOSIS — G4700 Insomnia, unspecified: Secondary | ICD-10-CM | POA: Diagnosis not present

## 2023-05-10 DIAGNOSIS — F331 Major depressive disorder, recurrent, moderate: Secondary | ICD-10-CM | POA: Diagnosis not present

## 2023-05-10 DIAGNOSIS — F410 Panic disorder [episodic paroxysmal anxiety] without agoraphobia: Secondary | ICD-10-CM | POA: Diagnosis not present

## 2023-05-10 MED ORDER — FLUOXETINE HCL 10 MG PO CAPS
10.0000 mg | ORAL_CAPSULE | Freq: Every day | ORAL | 5 refills | Status: DC
Start: 2023-05-10 — End: 2023-07-20

## 2023-05-10 NOTE — Telephone Encounter (Signed)
Called pt. Last seen 02/2023. Instructed her to call her pharmacy to ask if manufacturer changed and that is the reason for appearance change. She will call the pharmacy.

## 2023-05-10 NOTE — Progress Notes (Signed)
Tracy Malone [id number] 06/13/1932 89 y.o.  Virtual Visit via Telephone Note  I connected with pt on 05/10/23 at  1:40 PM EDT by telephone and verified that I am speaking with the correct person using two identifiers.   I discussed the limitations, risks, security and privacy concerns of performing an evaluation and management service by telephone and the availability of in person appointments. I also discussed with the patient that there may be a patient responsible charge related to this service. The patient expressed understanding and agreed to proceed.   I discussed the assessment and treatment plan with the patient. The patient was provided an opportunity to ask questions and all were answered. The patient agreed with the plan and demonstrated an understanding of the instructions.   The patient was advised to call back or seek an in-person evaluation if the symptoms worsen or if the condition fails to improve as anticipated.  I provided 12 minutes of non-face-to-face time during this encounter.  The patient was located at home.  The provider was located at [facility].   Dorothyann Gibbs, NP   Subjective:   Patient ID:  Tracy Malone is a 89 y.o. (DOB 1931/12/04) female.  Chief Complaint: No chief complaint on file.   HPI Tracy Malone presents for follow-up of MDD, GAD, panic attacks and insomnia.   Describes mood today as "not too good". Pleasant. Reports tearfulness at times. Mood symptoms - reports some depression, anxiety and irritability. Denies recent panic attacks. Reports worry, rumination, and over thinking. Mood is lower. Stating "I haven't been feeling too well". Would like to restart Prozac 10mg  capsule. Reports using  Xanax as needed. Reports chronic pain issues. Varying interest and motivation. Taking medications as prescribed. Seeing therapist - Rockne Menghini. Energy levels lower. Active, does not have a regular exercise routine.  Does not enjoy usual  interests and activities. Married. Lives with husband - 2 grown children. Spending time with family. Appetite decreased. Weight loss - 86 pounds. Sleeping difficulties. Averages 4 to 5 hours. Reports some daytime napping. Focus and concentration stable. Completing tasks. Managing aspects of household. Retired.  Denies SI or HI.  Denies AH or VH. Denies self harm. Denies substance use.  Previous medication trials: Multiple medication trials   Review of Systems:  Review of Systems  Musculoskeletal:  Negative for gait problem.  Neurological:  Negative for tremors.  Psychiatric/Behavioral:         Please refer to HPI    Medications: I have reviewed the patient's current medications.  Current Outpatient Medications  Medication Sig Dispense Refill   ALPRAZolam (XANAX) 0.5 MG tablet TAKE FOUR TABLETS BY MOUTH DAILY AS NEEDED FOR ANXIETY 120 tablet 0   b complex vitamins capsule Take 1 capsule by mouth daily. (Patient not taking: Reported on 04/11/2023)     Carbidopa-Levodopa ER (SINEMET CR) 25-100 MG tablet controlled release TAKE ONE TABLET THREE TIMES DAILY (Patient not taking: Reported on 04/19/2023) 270 tablet 3   dicyclomine (BENTYL) 10 MG capsule TAKE 1 CAPSULE BY MOUTH EVERY 6 HOURS AS NEEDED (Patient not taking: Reported on 11/09/2022) 30 capsule 6   polyethylene glycol (MIRALAX) 17 g packet Take 17 g by mouth daily as needed. (Patient not taking: Reported on 04/11/2023) 14 each 0   SYNTHROID 25 MCG tablet TAKE 1 TABLET DAILY MONDAY THROUGH SATURDAY, SKIP SUNDAY FOR HYPOTHYROIDISM (Patient not taking: Reported on 04/19/2023) 90 tablet 1   No current facility-administered medications for this visit.  Medication Side Effects: None  Allergies:  Allergies  Allergen Reactions   Iodinated Contrast Media Anaphylaxis   Iodine Anaphylaxis    IV and topical forms. Other reaction(s): Unknown   Levsin [Hyoscyamine Sulfate]     Vision problems/pt has glaucoma   Salmon [Fish Allergy]  Hives and Shortness Of Breath   Shellfish Allergy Anaphylaxis   Tramadol Swelling   Remeron [Mirtazapine] Other (See Comments)    Cause blurred vision and red eyes, pt has glaucoma   Aspirin Other (See Comments)    Sever stomach pain due to ulcer scaring.   Bis Subcit-Metronid-Tetracyc Swelling    Tongue swelling. Face tingling Other reaction(s): Unknown   Cephalexin Hives    Other reaction(s): hives   Ciprofloxacin Diarrhea   Codeine Nausea And Vomiting   Cyclobenzaprine Other (See Comments)    Tingly/prickly sensation. Other reaction(s): tingly/prickly sensation   Darvocet [Propoxyphene N-Acetaminophen] Nausea And Vomiting   Demerol [Meperidine] Nausea Only   Dexlansoprazole Swelling    Redness, swelling and peeling of both feet. Other reaction(s): foot pain   Diphedryl [Diphenhydramine] Other (See Comments)    Increased pulse/small amount ok   Doxycycline Hyclate Other (See Comments)    GI intolerance.   Doxycycline Hyclate     Other reaction(s): GI intolerance   Epinephrine Other (See Comments)    Breathing problems Other reaction(s): breathing problems/fainting   Erythromycin Other (See Comments)    GI intolerance. Other reaction(s): GI   Fish Oil     Other reaction(s): breathing problems/hives   Hyoscyamine     Other reaction(s): eye pain   Latex Other (See Comments)    Gloves ok.  Skin gets red from elastic in underwear and latex bandaides.   Meperidine Hcl     Other reaction(s): vomiting   Nitrofurantoin Diarrhea   Other     Other reaction(s): migraines Other reaction(s): Unknown Other reaction(s): Unknown Other reaction(s): Unknown Other reaction(s): increased pulse, faint, diarrhea   Prednisone Other (See Comments)    Headache Other reaction(s): headache   Ra Diphedryl Allergy [Diphenhydramine Hcl]     Other reaction(s): increased pulse small dose okay   Sertraline Hcl Swelling and Other (See Comments)    Migraine Swelling of tongue/lip  (09/2012) Other reaction(s): Unknown   Shellfish-Derived Products     Other reaction(s): Unknown   Sulfa Antibiotics Other (See Comments)    Increased pulse, fainting, diarrhea, thrush   Wellbutrin [Bupropion] Other (See Comments)    Headaches   Xylocaine [Lidocaine Hcl]     With epinephrine, given by dentist.  Speeded up heart rate and she passed out (occured twice, at dentist)   Xylocaine [Lidocaine]     Other reaction(s): Unknown   Biaxin [Clarithromycin] Rash    Started after completing 10 day course of 2000 mg /day, Lips swelling   Ibuprofen Other (See Comments)    Motrin ok with a GI effect. Other reaction(s): rash Motrin okay with a GI effect    Past Medical History:  Diagnosis Date   Allergy    Bell's palsy 1966   Hx: right side facial droop, resolved per patient 04/02/19   Carotid artery disease (HCC) 2010   on vascular screening;unchanged 2013.(could not tolerate simvastatin, no other statins tried)--<30% blockage bilat 07/2011   Chronic abdominal pain    Chronic fatigue and malaise    Claustrophobia    Cyst of left ovary    last imaging 06/2021, benign, no further f/u   Depression    treated in the past for  years;stopped in 2010 for a years   Duodenal ulcer 1962   h/o   Dysrhythmia    ocassional PVC's, no current problems per patient on 04/02/19   Fibromyalgia    Frequent PVCs 07/2012   Seen by [facility]: benign, asymptomatic, normal EF   Gallstones 02/2021   on [place]   GERD (gastroesophageal reflux disease)    diet controlled   Glaucoma, narrow-angle    s/p laser surgery   History of hiatal hernia    during endoscopy   Hypothyroid 03/2007   IBS (irritable bowel syndrome)    Dr. Elnoria Howard   Ischemic colitis (HCC) 11/21/2018   no current problems per patient on 04/02/19   Lichenoid keratosis 12/03/2020   Dr.Stinehelfer   Nausea and vomiting 10/29/2019   Ocular migraine    Osteoporosis 04/2010   Dr.Hawkes; later consulted Dr. Sharl Ma (2022)   Panic attack     Parkinson disease    Parkinson's disease 06/23/2016   Recurrent UTI    has cystocele-Dr.Grewal   Shingles 1999   h/o   Superficial thrombophlebitis 03/2009   RLE   Trochanteric bursitis 12/2008   bilateral    Family History  Problem Relation Age of Onset   Heart disease Mother    Hypertension Mother    Hypertension Sister    Arthritis Sister    Heart disease Brother    Lung cancer Brother        lung   Diabetes Maternal Grandfather    HIV Son    Diabetes Granddaughter        type 1   Colon cancer Neg Hx    Esophageal cancer Neg Hx    Prostate cancer Neg Hx    Stomach cancer Neg Hx    Rectal cancer Neg Hx     Social History   Socioeconomic History   Marital status: Married    Spouse name: Chrissie Noa   Number of children: 2   Years of education: Not on file   Highest education level: Not on file  Occupational History   Occupation: retired (school system)  Tobacco Use   Smoking status: Never   Smokeless tobacco: Never  Vaping Use   Vaping status: Never Used  Substance and Sexual Activity   Alcohol use: No    Alcohol/week: 0.0 standard drinks of alcohol   Drug use: No   Sexual activity: Not Currently    Partners: Male    Comment: Hysterectomy  Other Topics Concern   Not on file  Social History Narrative   Married.  Son lives in [place]; Daughter [name] lives in [place]; 2 grandchildren, 1 great grandson (in [place])      Updated 10/2022   Social Determinants of Health   Financial Resource Strain: Low Risk  (05/05/2021)   Overall Financial Resource Strain (CARDIA)    Difficulty of Paying Living Expenses: Not hard at all  Food Insecurity: Not on file  Transportation Needs: No Transportation Needs (05/05/2021)   PRAPARE - Administrator, Civil Service (Medical): No    Lack of Transportation (Non-Medical): No  Physical Activity: Not on file  Stress: Not on file  Social Connections: Unknown (12/06/2021)   [facility]    Social Network    Social Network: Not on file  Intimate Partner Violence: Unknown (10/28/2021)   [facility]   HITS    Physically Hurt: Not on file    Insult or Talk Down To: Not on  file    Threaten Physical Harm: Not on file    Scream or Curse: Not on file    Past Medical History, Surgical history, Social history, and Family history were reviewed and updated as appropriate.   Please see review of systems for further details on the patient's review from today.   Objective:   Physical Exam:  LMP  (LMP Unknown)   Physical Exam Constitutional:      General: She is not in acute distress. Musculoskeletal:        General: No deformity.  Neurological:     Mental Status: She is alert and oriented to person, place, and time.     Coordination: Coordination normal.  Psychiatric:        Attention and Perception: Attention and perception normal. She does not perceive auditory or visual hallucinations.        Mood and Affect: Mood normal. Mood is not anxious or depressed. Affect is not labile, blunt, angry or inappropriate.        Speech: Speech normal.        Behavior: Behavior normal.        Thought Content: Thought content normal. Thought content is not paranoid or delusional. Thought content does not include homicidal or suicidal ideation. Thought content does not include homicidal or suicidal plan.        Cognition and Memory: Cognition and memory normal.        Judgment: Judgment normal.     Comments: Insight intact     Lab Review:     Component Value Date/Time   NA 142 05/01/2023 1405   K 3.9 05/01/2023 1405   CL 102 05/01/2023 1405   CO2 26 05/01/2023 1405   GLUCOSE 89 05/01/2023 1405   GLUCOSE 91 04/20/2022 1022   BUN 14 05/01/2023 1405   CREATININE 0.60 05/01/2023 1405   CREATININE 0.75 10/22/2018 1401   CREATININE 0.67 11/23/2016 0815   CALCIUM 9.5 05/01/2023 1405   PROT 6.5 05/01/2023 1405   ALBUMIN 4.2 05/01/2023 1405   AST 23  05/01/2023 1405   AST 17 10/22/2018 1401   ALT 13 05/01/2023 1405   ALT 7 10/22/2018 1401   ALKPHOS 68 05/01/2023 1405   BILITOT 0.8 05/01/2023 1405   BILITOT 0.8 10/22/2018 1401   GFRNONAA >60 04/20/2022 1022   GFRNONAA >60 10/22/2018 1401   GFRAA >60 03/16/2020 1153   GFRAA >60 10/22/2018 1401       Component Value Date/Time   WBC 7.0 05/01/2023 1405   WBC 7.7 04/20/2022 1022   RBC 4.63 05/01/2023 1405   RBC 5.06 04/20/2022 1022   HGB 14.4 05/01/2023 1405   HCT 44.0 05/01/2023 1405   PLT 249 05/01/2023 1405   MCV 95 05/01/2023 1405   MCH 31.1 05/01/2023 1405   MCH 30.2 04/20/2022 1022   MCHC 32.7 05/01/2023 1405   MCHC 32.3 04/20/2022 1022   RDW 12.2 05/01/2023 1405   LYMPHSABS 2.3 05/01/2023 1405   MONOABS 0.6 04/20/2022 1022   EOSABS 0.1 05/01/2023 1405   BASOSABS 0.1 05/01/2023 1405    No results found for: "POCLITH", "LITHIUM"   No results found for: "PHENYTOIN", "PHENOBARB", "VALPROATE", "CBMZ"   .res Assessment: Plan:     Plan:  PDMP reviewed  Xanax 0.5mg  - take 1 and 1/2 tablets during the day and one tablets at bedtime - usually undertakes  Home health - M-F coming to help out - 4 hours a day.   RTC 3 months  Patient advised  to contact office with any questions, adverse effects, or acute worsening in signs and symptoms.  Discussed potential benefits, risk, and side effects of benzodiazepines to include potential risk of tolerance and dependence, as well as possible drowsiness. Advised patient not to drive if experiencing drowsiness and to take lowest possible effective dose to minimize risk of dependence and tolerance.   Diagnoses and all orders for this visit:  Major depressive disorder, recurrent episode, moderate (HCC)  Generalized anxiety disorder  Panic attacks  Insomnia, unspecified type    Please see After Visit Summary for patient specific instructions.  Future Appointments  Date Time Provider Department Center  05/17/2023   4:00 PM Mathis Fare, [place] CP-CP None  05/22/2023  3:00 PM Mathis Fare, LCSW CP-CP None  07/10/2023  2:30 PM Terri Piedra, DO CHD-DERM None  08/07/2023  2:30 PM Arnaldo Natal, NP [facility]GI [facility]  09/05/2023  2:00 PM Sater, Pearletha Furl, MD GNA-GNA None  11/12/2023  1:45 PM Joselyn Arrow, MD PFM-PFM [facility]SM    No orders of the defined types were placed in this encounter.     -------------------------------

## 2023-05-10 NOTE — Telephone Encounter (Signed)
Pt called wanting to discuss her Carbidopa-Levodopa ER (SINEMET CR) 25-100 MG tablet controlled release with the RN or MD. She states that she has had a change in color of the pill and she is not feeling the same way she fills when she took the original. Please advise.

## 2023-05-11 ENCOUNTER — Emergency Department (HOSPITAL_COMMUNITY): Payer: Medicare Other

## 2023-05-11 ENCOUNTER — Encounter (HOSPITAL_COMMUNITY): Payer: Self-pay

## 2023-05-11 ENCOUNTER — Emergency Department (HOSPITAL_COMMUNITY)
Admission: EM | Admit: 2023-05-11 | Discharge: 2023-05-11 | Disposition: A | Discharge status: Home / Self Care | Payer: Medicare Other | Attending: Emergency Medicine | Admitting: Emergency Medicine

## 2023-05-11 ENCOUNTER — Other Ambulatory Visit: Payer: Self-pay

## 2023-05-11 DIAGNOSIS — R9431 Abnormal electrocardiogram [ECG] [EKG]: Secondary | ICD-10-CM | POA: Diagnosis not present

## 2023-05-11 DIAGNOSIS — R110 Nausea: Secondary | ICD-10-CM | POA: Insufficient documentation

## 2023-05-11 DIAGNOSIS — G20C Parkinsonism, unspecified: Secondary | ICD-10-CM | POA: Diagnosis not present

## 2023-05-11 DIAGNOSIS — F32A Depression, unspecified: Secondary | ICD-10-CM | POA: Diagnosis not present

## 2023-05-11 DIAGNOSIS — R1032 Left lower quadrant pain: Secondary | ICD-10-CM | POA: Diagnosis not present

## 2023-05-11 DIAGNOSIS — N8110 Cystocele, unspecified: Secondary | ICD-10-CM | POA: Diagnosis not present

## 2023-05-11 DIAGNOSIS — K5730 Diverticulosis of large intestine without perforation or abscess without bleeding: Secondary | ICD-10-CM | POA: Diagnosis not present

## 2023-05-11 DIAGNOSIS — Z91040 Latex allergy status: Secondary | ICD-10-CM | POA: Insufficient documentation

## 2023-05-11 DIAGNOSIS — R531 Weakness: Secondary | ICD-10-CM | POA: Diagnosis not present

## 2023-05-11 DIAGNOSIS — K8020 Calculus of gallbladder without cholecystitis without obstruction: Secondary | ICD-10-CM | POA: Diagnosis not present

## 2023-05-11 DIAGNOSIS — R1084 Generalized abdominal pain: Secondary | ICD-10-CM | POA: Diagnosis not present

## 2023-05-11 DIAGNOSIS — N281 Cyst of kidney, acquired: Secondary | ICD-10-CM | POA: Diagnosis not present

## 2023-05-11 DIAGNOSIS — R197 Diarrhea, unspecified: Secondary | ICD-10-CM | POA: Insufficient documentation

## 2023-05-11 LAB — CBC WITH DIFFERENTIAL/PLATELET
Abs Immature Granulocytes: 0.02 10*3/uL (ref 0.00–0.07)
Basophils Absolute: 0.1 10*3/uL (ref 0.0–0.1)
Basophils Relative: 1 %
Eosinophils Absolute: 0.2 10*3/uL (ref 0.0–0.5)
Eosinophils Relative: 2 %
HCT: 46.6 % — ABNORMAL HIGH (ref 36.0–46.0)
Hemoglobin: 15 g/dL (ref 12.0–15.0)
Immature Granulocytes: 0 %
Lymphocytes Relative: 26 %
Lymphs Abs: 1.8 10*3/uL (ref 0.7–4.0)
MCH: 30.8 pg (ref 26.0–34.0)
MCHC: 32.2 g/dL (ref 30.0–36.0)
MCV: 95.7 fL (ref 80.0–100.0)
Monocytes Absolute: 0.6 10*3/uL (ref 0.1–1.0)
Monocytes Relative: 9 %
Neutro Abs: 4.4 10*3/uL (ref 1.7–7.7)
Neutrophils Relative %: 62 %
Platelets: 247 10*3/uL (ref 150–400)
RBC: 4.87 MIL/uL (ref 3.87–5.11)
RDW: 12.9 % (ref 11.5–15.5)
WBC: 7.1 10*3/uL (ref 4.0–10.5)
nRBC: 0 % (ref 0.0–0.2)

## 2023-05-11 LAB — URINALYSIS, ROUTINE W REFLEX MICROSCOPIC
Bacteria, UA: NONE SEEN
Bilirubin Urine: NEGATIVE
Glucose, UA: NEGATIVE mg/dL
Ketones, ur: NEGATIVE mg/dL
Nitrite: NEGATIVE
Protein, ur: NEGATIVE mg/dL
Specific Gravity, Urine: 1.009 (ref 1.005–1.030)
pH: 6 (ref 5.0–8.0)

## 2023-05-11 LAB — COMPREHENSIVE METABOLIC PANEL
ALT: 18 U/L (ref 0–44)
AST: 22 U/L (ref 15–41)
Albumin: 4 g/dL (ref 3.5–5.0)
Alkaline Phosphatase: 53 U/L (ref 38–126)
Anion gap: 10 (ref 5–15)
BUN: 13 mg/dL (ref 8–23)
CO2: 25 mmol/L (ref 22–32)
Calcium: 9.3 mg/dL (ref 8.9–10.3)
Chloride: 105 mmol/L (ref 98–111)
Creatinine, Ser: 0.61 mg/dL (ref 0.44–1.00)
GFR, Estimated: >60 mL/min (ref >60)
Glucose, Bld: 105 mg/dL — ABNORMAL HIGH (ref 70–99)
Potassium: 3.7 mmol/L (ref 3.5–5.1)
Sodium: 140 mmol/L (ref 135–145)
Total Bilirubin: 1.7 mg/dL — ABNORMAL HIGH (ref 0.3–1.2)
Total Protein: 7.1 g/dL (ref 6.5–8.1)

## 2023-05-11 LAB — TROPONIN I (HIGH SENSITIVITY): Troponin I (High Sensitivity): 5 ng/L (ref <18)

## 2023-05-11 LAB — LIPASE, BLOOD: Lipase: 47 U/L (ref 11–51)

## 2023-05-11 MED ORDER — ALPRAZOLAM 0.5 MG PO TABS
0.5000 mg | ORAL_TABLET | Freq: Once | ORAL | Status: AC
  Administered 2023-05-11: 0.5 mg via ORAL
  Filled 2023-05-11: qty 1

## 2023-05-11 NOTE — ED Triage Notes (Addendum)
Per EMS, pt coming from home. Pt complaining of decreased appetite, intermittent abd pain, and increased depression. Husband has been placed in hospice care recently, and home health says pt has not been taking her medications. Pt denies any thoughts of hurting herself.

## 2023-05-11 NOTE — ED Provider Notes (Signed)
[place] [facility] AT [facility] Provider Note   CSN: [id number] Arrival date & time: 05/11/23  1430     History  Chief Complaint  Patient presents with   Abdominal Pain   Depression    Tracy Malone is a 89 y.o. female, history of Parkinson's, colitis, GERD, who presents to the ED secondary to left lower quadrant abdominal pain, has been going on for the last few months, but got worse today.  She states she has associated nausea, had a few episodes of diarrhea, denies any urinary symptoms, or recent antibiotics.  She states that sharp, and just hurts.  States her husband told her to come in today, because he is tired of that going on.  Home Medications Prior to Admission medications   Medication Sig Start Date End Date Taking? Authorizing Provider  ALPRAZolam Prudy Feeler) 0.5 MG tablet TAKE FOUR TABLETS BY MOUTH DAILY AS NEEDED FOR ANXIETY 04/20/23   Mozingo, Thereasa Solo, NP  b complex vitamins capsule Take 1 capsule by mouth daily. Patient not taking: Reported on 04/11/2023    [provider]  Carbidopa-Levodopa ER (SINEMET CR) 25-100 MG tablet controlled release TAKE ONE TABLET THREE TIMES DAILY Patient not taking: Reported on 04/19/2023 02/26/23   Sater, Pearletha Furl, MD  dicyclomine (BENTYL) 10 MG capsule TAKE 1 CAPSULE BY MOUTH EVERY 6 HOURS AS NEEDED Patient not taking: Reported on 11/09/2022 03/23/22   Rachael Fee, MD  FLUoxetine (PROZAC) 10 MG capsule Take 1 capsule (10 mg total) by mouth daily. 05/10/23   Mozingo, Thereasa Solo, NP  polyethylene glycol (MIRALAX) 17 g packet Take 17 g by mouth daily as needed. Patient not taking: Reported on 04/11/2023 04/20/22   Gareth Eagle, PA-C  SYNTHROID 25 MCG tablet TAKE 1 TABLET DAILY MONDAY THROUGH SATURDAY, SKIP SUNDAY FOR HYPOTHYROIDISM Patient not taking: Reported on 04/19/2023 03/15/23   Joselyn Arrow, MD      Allergies    Iodinated contrast media, Iodine, Levsin [hyoscyamine sulfate], Salmon  [fish allergy], Shellfish allergy, Tramadol, Remeron [mirtazapine], Aspirin, Bis subcit-metronid-tetracyc, Cephalexin, Ciprofloxacin, Codeine, Cyclobenzaprine, Darvocet [propoxyphene n-acetaminophen], Demerol [meperidine], Dexlansoprazole, Diphedryl [diphenhydramine], Doxycycline hyclate, Doxycycline hyclate, Epinephrine, Erythromycin, Fish oil, Hyoscyamine, Latex, Meperidine hcl, Nitrofurantoin, Other, Prednisone, Ra diphedryl allergy [diphenhydramine hcl], Sertraline hcl, Shellfish-derived products, Sulfa antibiotics, Wellbutrin [bupropion], Xylocaine [lidocaine hcl], Xylocaine [lidocaine], Biaxin [clarithromycin], and Ibuprofen    Review of Systems   Review of Systems  Gastrointestinal:  Positive for abdominal pain, diarrhea and nausea. Negative for constipation.    Physical Exam Updated Vital Signs BP (!) 135/47 (BP Location: Left Arm)   Pulse 70   Temp 98.5 F (36.9 C) (Oral)   Resp 16   Ht 4\' 11"  (1.499 m)   Wt 36.3 kg   LMP  (LMP Unknown)   SpO2 99%   BMI 16.16 kg/m  Physical Exam Vitals and nursing note reviewed.  Constitutional:      General: She is not in acute distress.    Appearance: She is well-developed.  HENT:     Head: Normocephalic and atraumatic.  Eyes:     Conjunctiva/sclera: Conjunctivae normal.  Cardiovascular:     Rate and Rhythm: Normal rate and regular rhythm.     Heart sounds: No murmur heard. Pulmonary:     Effort: Pulmonary effort is normal. No respiratory distress.     Breath sounds: Normal breath sounds.  Abdominal:     Palpations: Abdomen is soft.     Tenderness: There is abdominal tenderness in the  left lower quadrant.  Musculoskeletal:        General: No swelling.     Cervical back: Neck supple.  Skin:    General: Skin is warm and dry.     Capillary Refill: Capillary refill takes less than 2 seconds.  Neurological:     Mental Status: She is alert.  Psychiatric:        Mood and Affect: Mood normal.     ED Results / Procedures /  Treatments   Labs (all labs ordered are listed, but only abnormal results are displayed) Labs Reviewed  CBC WITH DIFFERENTIAL/PLATELET - Abnormal; Notable for the following components:      Result Value   HCT 46.6 (*)    All other components within normal limits  COMPREHENSIVE METABOLIC PANEL - Abnormal; Notable for the following components:   Glucose, Bld 105 (*)    Total Bilirubin 1.7 (*)    All other components within normal limits  URINALYSIS, ROUTINE W REFLEX MICROSCOPIC - Abnormal; Notable for the following components:   Hgb urine dipstick Trendon Zaring (*)    Leukocytes,Ua Jizel Cheeks (*)    All other components within normal limits  LIPASE, BLOOD  TROPONIN I (HIGH SENSITIVITY)    EKG EKG Interpretation Date/Time:  Friday May 11 2023 17:14:27 EDT Ventricular Rate:  62 PR Interval:    QRS Duration:  124 QT Interval:  468 QTC Calculation: 475 R Axis:   24  Text Interpretation: Wide QRS rhythm with occasional Premature ventricular complexes Left bundle branch block Abnormal ECG When compared with ECG of 20-Apr-2022 10:01, PREVIOUS ECG IS PRESENT Confirmed by Beckey Downing [phone]) on 05/11/2023 5:22:30 PM  Radiology CT ABDOMEN PELVIS WO CONTRAST  Result Date: 05/11/2023 CLINICAL DATA:  LLQ abdominal pain. EXAM: CT ABDOMEN AND PELVIS WITHOUT CONTRAST TECHNIQUE: Multidetector CT imaging of the abdomen and pelvis was performed following the standard protocol without IV contrast. RADIATION DOSE REDUCTION: This exam was performed according to the departmental dose-optimization program which includes automated exposure control, adjustment of the mA and/or kV according to patient size and/or use of iterative reconstruction technique. COMPARISON:  CT scan abdomen and pelvis from 04/20/2022. FINDINGS: Lower chest: The lung bases are clear. No pleural effusion. The heart is normal in size. No pericardial effusion. Hepatobiliary: The liver is normal in size. Non-cirrhotic configuration. No suspicious  mass. No intrahepatic or extrahepatic bile duct dilation. Very Rosaly Labarbera volume layering calcified gallstones/sludge noted without imaging signs of acute cholecystitis. Normal gallbladder wall thickness. No pericholecystic inflammatory changes. Pancreas: Unremarkable. No pancreatic ductal dilatation or surrounding inflammatory changes. Spleen: Within normal limits. No focal lesion. Adrenals/Urinary Tract: Adrenal glands are unremarkable. No suspicious renal mass. There are several sinus cysts in the left kidney with largest measuring up to 1.2 x 1.2 cm. No hydronephrosis. No renal or ureteric calculi. Unremarkable urinary bladder. Redemonstration of pelvic floor collapse with Clemons Salvucci cystocele. Stomach/Bowel: No disproportionate dilation of the Ellysia Char or large bowel loops. No evidence of abnormal bowel wall thickening or inflammatory changes. The appendix is unremarkable. There are scattered diverticula mainly in the sigmoid colon, without imaging signs of diverticulitis. Vascular/Lymphatic: No ascites or pneumoperitoneum. No abdominal or pelvic lymphadenopathy, by size criteria. No aneurysmal dilation of the major abdominal arteries. There are mild peripheral atherosclerotic vascular calcifications of the aorta and its major branches. Reproductive: The uterus is surgically absent. There is a stable 2.8 x 3.5 cm fluid attenuation well-circumscribed structure in the left adnexa, incompletely characterized on the current examination but probable ovarian in etiology and  favored to represent a senescent ovarian cyst. Consider annual follow-up ultrasound to document stability. Other: The visualized soft tissues and abdominal wall are unremarkable. Musculoskeletal: No suspicious osseous lesions. There are mild - moderate multilevel degenerative changes in the visualized spine. L1 kyphoplasty noted. IMPRESSION: 1. No acute abnormality in the abdomen or pelvis. 2. Colonic diverticulosis without imaging signs of diverticulitis. 3.  Cholelithiasis without imaging signs of acute cholecystitis. 4. Multiple other nonacute observations, as described above. Aortic Atherosclerosis (ICD10-I70.0). Electronically Signed   By: Jules Schick M.D.   On: 05/11/2023 17:37    Procedures Procedures    Medications Ordered in ED Medications  ALPRAZolam Prudy Feeler) tablet 0.5 mg (0.5 mg Oral Given 05/11/23 1630)    ED Course/ Medical Decision Making/ A&P                                 Medical Decision Making Patient is a 89 year old female, here for left lower quadrant pain, has been going for several months, went today because her husband said she should.  She has left lower quadrant pain, exam without any guarding or rebound.  She is overall well-appearing, is not uncomfortable in her bed.  Obtain CT ab pelvis for further evaluation as well as basic blood work.  She is requesting her home Ativan  Amount and/or Complexity of Data Reviewed Labs: ordered.    Details: Unremarkable Radiology: ordered.    Details: 2.8 x 2.5 cm circumscribed structure in the left adnexa Discussion of management or test interpretation with external provider(s): Discussed with patient, she is feeling better after home Ativan, we discussed that she has a mass on her left adnexa, she is aware of this, states that she has noticed for some time, and thinks that is what is causing her pain.  She states she has intermittent pain in the sublingual area for some time.  She has a appointment for follow-up on Monday, to get her adnexa checked.  Will have her follow-up with a primary care doctor, and return if symptoms worsen.  Risk Prescription drug management.   Final Clinical Impression(s) / ED Diagnoses Final diagnoses:  LLQ pain    Rx / DC Orders ED Discharge Orders     None         Kamber Vignola, Harley Alto, PA 05/11/23 1854    Linwood Dibbles, MD 05/14/23 [phone]

## 2023-05-11 NOTE — Discharge Instructions (Signed)
Your CT scan shows that there is a possible ovarian mass on your left ovary, please follow-up with your primary care doctor for further evaluation.  This may be causing your intermittent pain.  Return to the ER if your pain is severe give intractable nausea, vomiting.  Your workup today was overall very reassuring

## 2023-05-14 DIAGNOSIS — K5730 Diverticulosis of large intestine without perforation or abscess without bleeding: Secondary | ICD-10-CM | POA: Diagnosis not present

## 2023-05-16 ENCOUNTER — Telehealth: Payer: Self-pay | Admitting: Gastroenterology

## 2023-05-16 ENCOUNTER — Telehealth: Payer: Self-pay | Admitting: Family Medicine

## 2023-05-16 ENCOUNTER — Ambulatory Visit: Payer: Medicare Other | Admitting: Neurology

## 2023-05-16 NOTE — Telephone Encounter (Signed)
Patient coming in tomorrow @ 1:45pm

## 2023-05-16 NOTE — Telephone Encounter (Signed)
Pt wants to talk to you about her side pain/ back pain  Can't eat or sleep, nausea and weight loss and needs some help She went to hospital and they told her nothing was wrong  Another doctor told her she had diverticulosis and somebody else told her diverticulitis and then hospital told her nothing was wrong

## 2023-05-16 NOTE — Telephone Encounter (Signed)
Inbound call from patient, requesting a sooner appointment. Patient is currently having extreme nausea, unintentional weight loss, and abdominal pain. Patient would like to discuss with a nurse symptoms.  Please advise.

## 2023-05-16 NOTE — Telephone Encounter (Signed)
Patient again calls with severe nausea, generalized abdominal pain and unintentional weight loss (same symptoms as described in 05/04/23 phone note). Patient was previously scheduled to see Quentin Mulling, PA-C for evaluation on 05/09/23. Unfortunately, patient did not keep this appointment. In the meantime, she did go to the emergency room at the request of her husband. Imaging tests and blood tests were unrevealing. Patient states she has also gone to her PCP who has not been able to pinpoint a cause to her symptoms. Patient states she needs a sooner appointment. I offered patient an appointment on 28-Jun-2023 with Dr Barron Alvine (work in appointment). Patient states "I saw him last time and he did nothing for me." Husband asks if patient can see Dr Adela Lank. I advised that this would require sending a note to both physicians to inquire about the change. Patient tells me "I will be dead by then" in regards to 2023/06/28 appointment but tells me she would like me to go ahead and schedule this appointment. I advised that if she is continuing to have severe symptoms, and is unable to wait for the June 28, 2023 appointment, she should return to the emergency room. Patient states "Im not going back to the emergency room. They didn't do a thing for me." Again, reiterated that unfortunately, we are limited in options. My advise to go to the ER remains.

## 2023-05-17 ENCOUNTER — Ambulatory Visit: Payer: Medicare Other | Admitting: Psychiatry

## 2023-05-17 ENCOUNTER — Ambulatory Visit: Payer: Medicare Other | Admitting: Family Medicine

## 2023-05-17 DIAGNOSIS — F331 Major depressive disorder, recurrent, moderate: Secondary | ICD-10-CM

## 2023-05-21 ENCOUNTER — Other Ambulatory Visit: Payer: Self-pay | Admitting: Adult Health

## 2023-05-21 DIAGNOSIS — F411 Generalized anxiety disorder: Secondary | ICD-10-CM

## 2023-05-22 ENCOUNTER — Ambulatory Visit: Payer: Medicare Other | Admitting: Psychiatry

## 2023-05-22 DIAGNOSIS — F411 Generalized anxiety disorder: Secondary | ICD-10-CM

## 2023-05-22 NOTE — Progress Notes (Signed)
[facility] Counselor/Therapist Progress Note  Patient ID: Tracy Malone, MRN: [id number],    Date: 05/22/2023  Time Spent: 48 minutes   Treatment Type: Individual Therapy  Virtual Visit via Telehealth Note: Telephone session as patient is not able to do the video session Connected with patient by a telemedicine/telehealth application, with their informed consent, and verified patient privacy and that I am speaking with the correct person using two identifiers. I discussed the limitations, risks, security and privacy concerns of performing psychotherapy and the availability of in person appointments. I also discussed with the patient that there may be a patient responsible charge related to this service. The patient expressed understanding and agreed to proceed. I discussed the treatment planning with the patient. The patient was provided an opportunity to ask questions and all were answered. The patient agreed with the plan and demonstrated an understanding of the instructions. The patient was advised to call  our office if  symptoms worsen or feel they are in a crisis state and need immediate contact.   Therapist Location: office Patient Location: home  Reported Symptoms:  anxiety, depression   Mental Status Exam:  Appearance:   N/a    telehealth      Behavior:  Appropriate, Sharing, and some motivation  Motor:  Using rollator as needed in walking  Speech/Language:   Clear and Coherent  Affect:  N/a     telehealth  Mood:  anxious, depressed, and irritable  Thought process:  goal directed  Thought content:    Rumination  Sensory/Perceptual disturbances:    WNL  Orientation:  oriented to person, place, time/date, situation, day of week, month of year, year, and stated date of Oct. 29, 2024  Attention:  Fair  Concentration:  Good and Fair  Memory:  WNL  Fund of knowledge:   Good  Insight:    Good and Fair  Judgment:   Good  Impulse Control:  Good and Fair   Risk  Assessment: Danger to Self:  No Self-injurious Behavior: No Danger to Others: No Duty to Warn:no Physical Aggression / Violence:No  Access to Firearms a concern: No  Gang Involvement:No   Subjective:) Patient in telehealth session today reporting anxiety, depression, and "worrying".  States that her worries are mostly related to health issues and family.  Did process multiple concerns in session today that related to her health issues and her worrying.  Also reflected on a prior session when we had focused on worrying along with some of its negative impact on her and reviewed some of this today as she had seemed to find it helpful previously. Reports back issues continue, not sleeping too good. Is in touch with her back doctor and to see him next week. Fears "about my husband and me" and "having so many issues as we get older" and therapist encouraged her to follow the recommendations from her doctors. Vented a lot of her fears and frustrations of aging and having increased health issues. Seemed to feel heard. More calm. No tearfulness today. Her physical and emotional challenges continue but today is some better and not having quite as much physical pain, but not pain-free at this point.  Encouraged patient and trying to decrease her self-defeating self talk as that always tends to aggravate her depression and deflate her mood.  Also encouraged her as she is able to use some of the music and reading that she enjoys and has said previously helps her feel more peace.  Interventions:  Cognitive Behavioral Therapy and Ego-Supportive  Long term goal: Develop the ability to recognize, accept, and cope with feelings of anxiety and depression. Short term goal: Verbalize any unresolved grief issues that may be contributing to anxiety and depression. Strategy: Replace negative self-defeating self talk with verbalization of realistic and positive cognitive messages to lessen depression/anxiety and improve  mood.  Diagnosis:   ICD-10-CM   1. Generalized anxiety disorder  F41.1      Plan: Patient today participating well in session, still having some depression although did not sound as depressed today as prior session.  Talked with a little more strength in her voice.  Still having some back pain and is to see her back doctor within a week.  Denies any thoughts of harming herself or anyone else.  Reports trying to decrease her negative self talk and today she did not seem to use that as much.  States that her music helps her especially, as does being there with and talking with her husband who remains very supportive however he also has significant health issues that have worsened with age.  Patient over time has made progress and needs to continue her work with goal-directed behaviors to move in a more hopeful and healthier direction as much as possible. Reminded and encouraged patient to be practicing more positive/self affirming behaviors and thoughts including: Remaining on her prescribed medication, refrain from assuming worst-case scenarios, use the "getting centered" list that we worked on in her previous sessions to help her when she is more anxious or frustrated, remember her positives, reach out to her extended family via phone calls, listen to the music that she loves and finds calming, positive self talk, and recognize the strength she shows when working with goal-directed behaviors to move in a direction that supports her improved emotional health and her overall wellbeing.  Goal review and progress/challenges noted with patient.  Next appointment within 2 to 3 weeks.   Mathis Fare, LCSW

## 2023-05-24 ENCOUNTER — Emergency Department (HOSPITAL_BASED_OUTPATIENT_CLINIC_OR_DEPARTMENT_OTHER): Payer: Medicare Other

## 2023-05-24 ENCOUNTER — Encounter (HOSPITAL_BASED_OUTPATIENT_CLINIC_OR_DEPARTMENT_OTHER): Payer: Self-pay

## 2023-05-24 ENCOUNTER — Emergency Department (HOSPITAL_BASED_OUTPATIENT_CLINIC_OR_DEPARTMENT_OTHER)
Admission: EM | Admit: 2023-05-24 | Discharge: 2023-05-24 | Disposition: A | Discharge status: Home / Self Care | Payer: Medicare Other | Attending: Emergency Medicine | Admitting: Emergency Medicine

## 2023-05-24 ENCOUNTER — Other Ambulatory Visit (HOSPITAL_BASED_OUTPATIENT_CLINIC_OR_DEPARTMENT_OTHER): Payer: Self-pay

## 2023-05-24 ENCOUNTER — Other Ambulatory Visit: Payer: Self-pay

## 2023-05-24 DIAGNOSIS — Z79890 Hormone replacement therapy (postmenopausal): Secondary | ICD-10-CM | POA: Diagnosis not present

## 2023-05-24 DIAGNOSIS — G20C Parkinsonism, unspecified: Secondary | ICD-10-CM | POA: Diagnosis not present

## 2023-05-24 DIAGNOSIS — Z91040 Latex allergy status: Secondary | ICD-10-CM | POA: Diagnosis not present

## 2023-05-24 DIAGNOSIS — R748 Abnormal levels of other serum enzymes: Secondary | ICD-10-CM | POA: Insufficient documentation

## 2023-05-24 DIAGNOSIS — K5790 Diverticulosis of intestine, part unspecified, without perforation or abscess without bleeding: Secondary | ICD-10-CM | POA: Diagnosis not present

## 2023-05-24 DIAGNOSIS — E039 Hypothyroidism, unspecified: Secondary | ICD-10-CM | POA: Insufficient documentation

## 2023-05-24 DIAGNOSIS — I2510 Atherosclerotic heart disease of native coronary artery without angina pectoris: Secondary | ICD-10-CM | POA: Diagnosis not present

## 2023-05-24 DIAGNOSIS — K8020 Calculus of gallbladder without cholecystitis without obstruction: Secondary | ICD-10-CM | POA: Diagnosis not present

## 2023-05-24 DIAGNOSIS — N8110 Cystocele, unspecified: Secondary | ICD-10-CM | POA: Diagnosis not present

## 2023-05-24 DIAGNOSIS — K5900 Constipation, unspecified: Secondary | ICD-10-CM | POA: Insufficient documentation

## 2023-05-24 DIAGNOSIS — R1032 Left lower quadrant pain: Secondary | ICD-10-CM

## 2023-05-24 DIAGNOSIS — R109 Unspecified abdominal pain: Secondary | ICD-10-CM | POA: Diagnosis not present

## 2023-05-24 LAB — URINALYSIS, ROUTINE W REFLEX MICROSCOPIC
Bilirubin Urine: NEGATIVE
Glucose, UA: NEGATIVE mg/dL
Hgb urine dipstick: NEGATIVE
Ketones, ur: NEGATIVE mg/dL
Leukocytes,Ua: NEGATIVE
Nitrite: NEGATIVE
Protein, ur: NEGATIVE mg/dL
Specific Gravity, Urine: <1.005 — ABNORMAL LOW (ref 1.005–1.030)
pH: 6 (ref 5.0–8.0)

## 2023-05-24 LAB — COMPREHENSIVE METABOLIC PANEL
ALT: 13 U/L (ref 0–44)
AST: 21 U/L (ref 15–41)
Albumin: 4.4 g/dL (ref 3.5–5.0)
Alkaline Phosphatase: 51 U/L (ref 38–126)
Anion gap: 7 (ref 5–15)
BUN: 12 mg/dL (ref 8–23)
CO2: 30 mmol/L (ref 22–32)
Calcium: 10.2 mg/dL (ref 8.9–10.3)
Chloride: 104 mmol/L (ref 98–111)
Creatinine, Ser: 0.63 mg/dL (ref 0.44–1.00)
GFR, Estimated: >60 mL/min (ref >60)
Glucose, Bld: 92 mg/dL (ref 70–99)
Potassium: 4.2 mmol/L (ref 3.5–5.1)
Sodium: 141 mmol/L (ref 135–145)
Total Bilirubin: 1.4 mg/dL — ABNORMAL HIGH (ref 0.3–1.2)
Total Protein: 6.9 g/dL (ref 6.5–8.1)

## 2023-05-24 LAB — CBC
HCT: 44 % (ref 36.0–46.0)
Hemoglobin: 14.6 g/dL (ref 12.0–15.0)
MCH: 30.8 pg (ref 26.0–34.0)
MCHC: 33.2 g/dL (ref 30.0–36.0)
MCV: 92.8 fL (ref 80.0–100.0)
Platelets: 268 10*3/uL (ref 150–400)
RBC: 4.74 MIL/uL (ref 3.87–5.11)
RDW: 13.2 % (ref 11.5–15.5)
WBC: 7.5 10*3/uL (ref 4.0–10.5)
nRBC: 0 % (ref 0.0–0.2)

## 2023-05-24 LAB — LIPASE, BLOOD: Lipase: 56 U/L — ABNORMAL HIGH (ref 11–51)

## 2023-05-24 MED ORDER — FENTANYL CITRATE PF 50 MCG/ML IJ SOSY
50.0000 ug | PREFILLED_SYRINGE | Freq: Once | INTRAMUSCULAR | Status: AC
  Administered 2023-05-24: 50 ug via INTRAVENOUS
  Filled 2023-05-24: qty 1

## 2023-05-24 MED ORDER — ONDANSETRON HCL 4 MG/2ML IJ SOLN
4.0000 mg | Freq: Once | INTRAMUSCULAR | Status: AC
  Administered 2023-05-24: 4 mg via INTRAVENOUS
  Filled 2023-05-24: qty 2

## 2023-05-24 NOTE — ED Notes (Signed)
Pt reports left sided pain for over a month; states it has gotten gradually worse, was told by her pcp that it was gas, but it hasn't gone away. Pt reports no BM in a week, states BMs have been irregular for the same length of time. Denies vomiting, endorses nausea "once in a while." Pt alert & oriented, nad noted.

## 2023-05-24 NOTE — ED Notes (Signed)
Pt discharged home after verbalizing understanding of discharge instructions; nad noted. 

## 2023-05-24 NOTE — ED Notes (Signed)
Pt ambulated to restroom with assistance.  

## 2023-05-24 NOTE — ED Provider Notes (Signed)
[place] EMERGENCY DEPARTMENT AT [facility] Provider Note   CSN: [id number] Arrival date & time: 05/24/23  1052     History  No chief complaint on file.   Tracy Malone is a 89 y.o. female with past medical history significant for ischemic colitis, IBS, fibromyalgia, anxiety, depression, osteoporosis, CAD, Parkinson's disease, hypothyroidism presents to the ED complaining of left-sided abdominal pain, nausea, and constipation.  She states that the pain has been on and off for the past couple of months, but it became worse over the past couple of days.  She states her last bowel movement was around 7 days ago.  She also reports that she has had a loss of appetite and has lost around 20 pounds in 1 month.  Endorses fatigue and generalized weakness as well.  Denies fever, chills, urinary symptoms.         Home Medications Prior to Admission medications   Medication Sig Start Date End Date Taking? Authorizing Provider  ALPRAZolam Prudy Feeler) 0.5 MG tablet TAKE FOUR TABLETS BY MOUTH DAILY AS NEEDED FOR ANXIETY 05/22/23   Mozingo, Thereasa Solo, NP  b complex vitamins capsule Take 1 capsule by mouth daily. Patient not taking: Reported on 04/11/2023    [provider]  Carbidopa-Levodopa ER (SINEMET CR) 25-100 MG tablet controlled release TAKE ONE TABLET THREE TIMES DAILY Patient not taking: Reported on 04/19/2023 02/26/23   Sater, Pearletha Furl, MD  dicyclomine (BENTYL) 10 MG capsule TAKE 1 CAPSULE BY MOUTH EVERY 6 HOURS AS NEEDED Patient not taking: Reported on 11/09/2022 03/23/22   Rachael Fee, MD  FLUoxetine (PROZAC) 10 MG capsule Take 1 capsule (10 mg total) by mouth daily. 05/10/23   Mozingo, Thereasa Solo, NP  polyethylene glycol (MIRALAX) 17 g packet Take 17 g by mouth daily as needed. Patient not taking: Reported on 04/11/2023 04/20/22   Gareth Eagle, PA-C  SYNTHROID 25 MCG tablet TAKE 1 TABLET DAILY MONDAY THROUGH SATURDAY, SKIP SUNDAY FOR  HYPOTHYROIDISM Patient not taking: Reported on 04/19/2023 03/15/23   Joselyn Arrow, MD      Allergies    Iodinated contrast media, Iodine, Levsin [hyoscyamine sulfate], Salmon [fish allergy], Shellfish allergy, Tramadol, Remeron [mirtazapine], Aspirin, Bis subcit-metronid-tetracyc, Cephalexin, Ciprofloxacin, Codeine, Cyclobenzaprine, Darvocet [propoxyphene n-acetaminophen], Demerol [meperidine], Dexlansoprazole, Diphedryl [diphenhydramine], Doxycycline hyclate, Doxycycline hyclate, Epinephrine, Erythromycin, Fish oil, Hyoscyamine, Latex, Meperidine hcl, Nitrofurantoin, Other, Prednisone, Ra diphedryl allergy [diphenhydramine hcl], Sertraline hcl, Shellfish-derived products, Sulfa antibiotics, Wellbutrin [bupropion], Xylocaine [lidocaine hcl], Xylocaine [lidocaine], Biaxin [clarithromycin], and Ibuprofen    Review of Systems   Review of Systems  Constitutional:  Positive for appetite change (decreased appetite) and fatigue. Negative for chills and fever.  Gastrointestinal:  Positive for abdominal pain, constipation and nausea. Negative for diarrhea and vomiting.  Genitourinary:  Negative for dysuria, frequency and urgency.  Neurological:  Positive for weakness (generalized).    Physical Exam Updated Vital Signs BP (!) 143/58   Pulse 71   Temp 97.8 F (36.6 C) (Oral)   Resp 16   LMP  (LMP Unknown)   SpO2 99%  Physical Exam Vitals and nursing note reviewed.  Constitutional:      General: She is not in acute distress.    Appearance: She is underweight. She is ill-appearing. She is not diaphoretic.  Cardiovascular:     Rate and Rhythm: Normal rate and regular rhythm.  Pulmonary:     Effort: Pulmonary effort is normal.  Abdominal:     General: Abdomen is flat. Bowel sounds are normal.  Palpations: Abdomen is soft.     Tenderness: There is abdominal tenderness in the suprapubic area, left upper quadrant and left lower quadrant. There is no guarding or rebound.  Skin:    General: Skin is  warm and dry.     Capillary Refill: Capillary refill takes less than 2 seconds.     Coloration: Skin is pale.  Neurological:     Mental Status: She is alert. Mental status is at baseline.  Psychiatric:        Mood and Affect: Mood normal.        Behavior: Behavior normal.     ED Results / Procedures / Treatments   Labs (all labs ordered are listed, but only abnormal results are displayed) Labs Reviewed  LIPASE, BLOOD - Abnormal; Notable for the following components:      Result Value   Lipase 56 (*)    All other components within normal limits  COMPREHENSIVE METABOLIC PANEL - Abnormal; Notable for the following components:   Total Bilirubin 1.4 (*)    All other components within normal limits  URINALYSIS, ROUTINE W REFLEX MICROSCOPIC - Abnormal; Notable for the following components:   Color, Urine COLORLESS (*)    Specific Gravity, Urine <1.005 (*)    All other components within normal limits  CBC    EKG None  Radiology CT ABDOMEN PELVIS WO CONTRAST  Result Date: 05/24/2023 CLINICAL DATA:  One-month history of slowly worsening left-sided abdominal pain EXAM: CT ABDOMEN AND PELVIS WITHOUT CONTRAST TECHNIQUE: Multidetector CT imaging of the abdomen and pelvis was performed following the standard protocol without IV contrast. RADIATION DOSE REDUCTION: This exam was performed according to the departmental dose-optimization program which includes automated exposure control, adjustment of the mA and/or kV according to patient size and/or use of iterative reconstruction technique. COMPARISON:  CT abdomen and pelvis dated 05/11/2023 and multiple priors FINDINGS: Lower chest: No focal consolidation or pulmonary nodule in the lung bases. No pleural effusion or pneumothorax demonstrated. Partially imaged heart size is normal. Hepatobiliary: No focal hepatic lesions. No intra or extrahepatic biliary ductal dilation. Cholelithiasis. Pancreas: No focal lesions or main ductal dilation. Spleen:  Normal in size without focal abnormality. Adrenals/Urinary Tract: No adrenal nodules. No suspicious renal mass on this noncontrast enhanced examination, calculi or hydronephrosis. No focal bladder wall thickening. Small inferior cystocele. Stomach/Bowel: Normal appearance of the stomach. No evidence of bowel wall thickening, distention, or inflammatory changes. Small to moderate volume stool throughout the colon, which is hyperattenuating. Diverticulosis without acute diverticulitis. Normal appendix. Vascular/Lymphatic: Aortic atherosclerosis. No enlarged abdominal or pelvic lymph nodes. Reproductive: No right adnexal masses. Simple-appearing left adnexal cyst measuring 3.3 x 2.8 cm is unchanged dating back to at least 11/22/2018 when it was evaluated by ultrasound. No specific follow-up imaging recommended. Other: No free fluid, fluid collection, or free air. Musculoskeletal: No acute or abnormal lytic or blastic osseous lesions. Multilevel degenerative changes of the partially imaged thoracic and lumbar spine. Unchanged wedging of L1 status post vertebral augmentation. IMPRESSION: 1. No acute abdominopelvic findings. 2. Small to moderate volume hyperdense stool throughout the colon. Recommend correlation with recent oral contrast ingestion. 3. Diverticulosis without acute diverticulitis. 4. Cholelithiasis. 5.  Aortic Atherosclerosis (ICD10-I70.0). Electronically Signed   By: Agustin Cree M.D.   On: 05/24/2023 16:41    Procedures Procedures    Medications Ordered in ED Medications  fentaNYL (SUBLIMAZE) injection 50 mcg (50 mcg Intravenous Given 05/24/23 1344)  ondansetron (ZOFRAN) injection 4 mg (4 mg Intravenous Given 05/24/23 1340)  ED Course/ Medical Decision Making/ A&P                                 Medical Decision Making Amount and/or Complexity of Data Reviewed Labs: ordered.   This patient presents to the ED with chief complaint(s) of abdominal pain, nausea, constipation with pertinent  past medical history of recurrent UTI, colitis, GERD, left ovarian cyst.  The complaint involves an extensive differential diagnosis and also carries with it a high risk of complications and morbidity.    The differential diagnosis includes diverticulitis, colitis, constipation, increasing left ovarian cyst   The initial plan is to obtain UA, labs  Initial Assessment:   Exam significant for ill-appearing, underweight patient who is not in acute distress.  She does appear uncomfortable.  Abdomen is soft with LLQ and suprapubic tenderness.  No distension or overlying skin changes.    Independent ECG/labs interpretation:  The following labs were independently interpreted:  CBC without leukocytosis or anemia.  UA without infection.  Metabolic panel without electrolyte disturbance.  Renal function is normal.  Lipase mildly elevated, but not indicative of pancreatitis.  Independent visualization and interpretation of imaging: I independently visualized the following imaging with scope of interpretation limited to determining acute life threatening conditions related to emergency care: CT abdomen pelvis without contrast, which revealed moderate amount of stool in the colon and a stable left-sided ovarian cyst.  No other acute findings to explain patient's symptoms.  There is no evidence of diverticulitis, colitis, appendicitis.  Treatment and Reassessment: Patient was given Zofran and fentanyl with improvement in her symptoms.  Patient was able to sip on water without issue.  Disposition:   Suspect patient symptoms are secondary to constipation as she has not had a significant bowel movement in the past week.  Patient reports that she deals with chronic constipation issues and this is an on and off problem.  Patient's family member at bedside states that she has a follow-up appointment on Tuesday with GI.  Will send patient home with information about a bowel regimen to help with constipation.  The  patient has been appropriately medically screened and/or stabilized in the ED. I have low suspicion for any other emergent medical condition which would require further screening, evaluation or treatment in the ED or require inpatient management. At time of discharge the patient is hemodynamically stable and in no acute distress. I have discussed work-up results and diagnosis with patient and answered all questions. Patient is agreeable with discharge plan. We discussed strict return precautions for returning to the emergency department and they verbalized understanding.             Final Clinical Impression(s) / ED Diagnoses Final diagnoses:  Constipation, unspecified constipation type  Abdominal pain, LLQ (left lower quadrant)    Rx / DC Orders ED Discharge Orders     None         Lenard Simmer, PA-C 05/24/23 1729    Rexford Maus, DO 05/24/23 1738

## 2023-05-24 NOTE — ED Triage Notes (Signed)
She c/o left-sided abd. Pain. Her daughter is with her.

## 2023-05-24 NOTE — Discharge Instructions (Addendum)
You were seen in the emergency department today for constipation.  We recommend that you use one or more of the following over-the-counter medications in the order described:   1)  Colace (or Dulcolax) 100 mg:  This is a stool softener, and you may take it once or twice a day as needed. 2)  Senna tablets:  This is a bowel stimulant that will help "push" out your stool. It is the next step to add after you have tried a stool softener. 3)  Miralax (powder):  This medication works by drawing additional fluid into your intestines and helps to flush out your stool.  Mix the powder with water or juice according to label instructions.  It may help if the Colace and Senna are not sufficient, but you must be sure to use the recommended amount of water or juice when you mix up the powder. Remember that narcotic pain medications are constipating, so avoid them or minimize their use.  Drink plenty of fluids.  If you do not have relief with the above mentioned medications, you may try using a saline or mineral oil enema.    Please return to the Emergency Department immediately if you develop new or worsening symptoms that concern you, such as (but not limited to) fever > 100.4 degrees, severe abdominal pain, or persistent vomiting, or if you are unable to have a bowel movement despite over the counter medications.  Follow up with your GI doctor as scheduled on Tuesday.

## 2023-05-29 ENCOUNTER — Encounter: Payer: Self-pay | Admitting: Gastroenterology

## 2023-05-29 ENCOUNTER — Ambulatory Visit (INDEPENDENT_AMBULATORY_CARE_PROVIDER_SITE_OTHER): Payer: Medicare Other | Admitting: Gastroenterology

## 2023-05-29 ENCOUNTER — Other Ambulatory Visit: Payer: Self-pay | Admitting: Family Medicine

## 2023-05-29 VITALS — BP 130/68 | HR 68 | Ht <= 58 in | Wt 82.1 lb

## 2023-05-29 DIAGNOSIS — K5900 Constipation, unspecified: Secondary | ICD-10-CM | POA: Diagnosis not present

## 2023-05-29 DIAGNOSIS — R109 Unspecified abdominal pain: Secondary | ICD-10-CM | POA: Diagnosis not present

## 2023-05-29 DIAGNOSIS — G20A1 Parkinson's disease without dyskinesia, without mention of fluctuations: Secondary | ICD-10-CM

## 2023-05-29 DIAGNOSIS — E46 Unspecified protein-calorie malnutrition: Secondary | ICD-10-CM

## 2023-05-29 DIAGNOSIS — R110 Nausea: Secondary | ICD-10-CM

## 2023-05-29 DIAGNOSIS — F332 Major depressive disorder, recurrent severe without psychotic features: Secondary | ICD-10-CM

## 2023-05-29 DIAGNOSIS — R627 Adult failure to thrive: Secondary | ICD-10-CM

## 2023-05-29 MED ORDER — DICYCLOMINE HCL 10 MG PO CAPS: 10.0000 mg | ORAL_CAPSULE | Freq: Four times a day (QID) | ORAL | 6 refills | Status: DC | PRN

## 2023-05-29 NOTE — Progress Notes (Unsigned)
Chief Complaint:    Nausea, abdominal pain, weight loss, decreased appetite, change in bowel habits  GI History: Review of previous GI testing; Dr. Elnoria Howard and changed to Dr. Christella Hartigan 10/2014:  -CBC, CMP, and TSH were normal in 07/2014 and again in 08/2014. CT scan02/26/2016. This was done without contrast due to dye allergy.  This showed no acute findings in the abdomen and pelvis specifically to explain her history of abdominal pain. There is a 3.0 cm left ovarian cystic lesion that is likely benign. She is following with gynecology and they're going to monitor that, but they think that it is benign.  Stomach, duodenum, small bowel, and colon grossly unremarkable.Abdominal ultrasound on 09/04/2014 showed no acute or chronic abnormalities of the hepatobiliary tree; evaluation of the pancreatic tail was limited. There is no acute abnormalities of the kidney demonstrated. A 1.6 cm diameter mid to lower pole cyst was noted in the right kidney.  August 2015 HIDA scan with [facility], which was normal.  This was performed for complaints of upper abdominal pain, appetite loss, fatigue, and weight loss at that time as well.EGD was in December 2013 at which time she was found to have moderate gastritis. Biopsies actually showed H. Pylori with chronic active gastritis, however, after taking 1 dose of the Pylera medication she discontinued it because of side effects (it made her feel horrible).  Therefore, she has not completed treatment for H. Pylori. Duodenal biopsies were unremarkable at that time.  flexible sigmoidoscopy also in December 2013 that was normal with only medium hemorrhoids noted. This was performed for diarrhea and biopsies of the descending and sigmoid colon were unremarkable. She states that she had a full colonoscopy prior to that with Dr. Elnoria Howard, however, after calling his office they stated they have never performed a full colonoscopy on her. Gastric emptying scan in January 2014 showed 33% of the ingested  activity remaining in the stomach which is mildly abnormal compared to the normal range of less than 30% of the ingested activity remaining at 120 minutes.  Repeat GES 09/2014 was normal.  EGD 11/2014 with Dr. Christella Hartigan showed pan-gastritis with positive Hpylori on biopsies; also small hiatal hernia.  Saw ID and was apparently treated with subsequent testing negative.  Repeat abdominal ultrasound 09/2015 was normal.  07/2016 H. Pylori stool antigen was negative. Repeat EGD 08/2016: mild gastritis, + for H pylori again. Referred to ID and tried a regimen but could not complete it.  Repeat EGD 10/2019 with mild nonspecific, nonulcer gastritis.  Colonoscopy 10/2019 with left-sided diverticulosis, otherwise normal.  Abdominal ultrasound 02/2021 cholelithiasis without cholecystitis, minimal central intrahepatic duct dilation and CBD 0.8 cm with normal liver.  CT A/P 06/2021 normal GI tract.  CT A/P 03/2022 moderate stool burden throughout colon, pelvic floor prolapse with small cystocele, otherwise normal.  ER evaluation on 04/20/2022 with normal labs, CT A/P with moderate stool burden throughout the colon, pelvic floor prolapse with small cystocele. Otherwise normal.  ER evaluation 05/11/2023 and again 05/24/2023 for these same symptoms.  CT x 2 on each of those occasions with small to moderate stool but otherwise unremarkable.  Labs unremarkable.     2.  Presumed ischemic colitis.  April 2020, CT scan suggested "mild circumferential wall thickening about the transverse colon, which may be related to incomplete distention or possibly mild colitis".    Most recent endoscopy: EGD April 2021 for generalized abdominal pain, anorexia, nausea and weight loss showed mild nonspecific gastritis.  Pathology showed mild gastritis, no H.  pylori.   Colonoscopy April 2021 for generalized abdominal pain, constipation, diarrhea and weight loss showed left-sided diverticulosis but was otherwise completely normal.  HPI:     Patient is  a 89 y.o. female presenting to the Gastroenterology Clinic for evaluation of multiple GI symptoms, to include nausea, abdominal pain, weight loss, decreased appetite, change in bowel habits/constipation.  Was last seen by me in the GI clinic on 04/28/2022 for LLQ pain, decreased appetite, intermittent constipation.  Abdominal pain diagnosed as MSK etiology and patient declined trial of NSAIDs.  Recommended heating pad and follow-up with PCM.  Recommended continuing Bentyl, Colace, MiraLAX for IBS-C.  Was seen in the ER on 05/11/2023 with the same symptoms.  Labs unremarkable.  CT A/P without any acute intra-abdominal pathology.  Was again seen in the ER on 05/24/2023 with left-sided abdominal pain, constipation, decreased appetite.  Lipase 56, otherwise labs were unremarkable.  CT A/P with small to moderate stool, otherwise unremarkable.  Treated with fentanyl, Zofran, IV fluid and discharged home.  Since then started Colace 100 mg BID w/o change.  Tried senna x1 day and stopped due to cramping.  She reports no improvement with MiraLAX.  Previously trialed Linzess, and stopped due to diarrhea.   Does have episodic nausea most days.  Tends to improve for 1-2 days after a BM, then slowly returns.  No emesis.  Still with left-sided abdominal pain which is largely unchanged from previous.  This pain has been evaluated extensively as outlined above.  Presents to the office with her daughter.  She reports the patient maintains a largely sedentary lifestyle.  She is asking about the possibility of reengaging palliative/hospice care.  Nausea improves for 1-2 days when she has a BM.   Decreased bowel movements. Last BM was 5 days ago. Can go up to 7-10 days without BM.     Review of systems:     No chest pain, no SOB, no fevers, no urinary sx   Past Medical History:  Diagnosis Date   Allergy    Bell's palsy 1966   Hx: right side facial droop, resolved per patient 04/02/19   Carotid artery disease  (HCC) 2010   on vascular screening;unchanged 2013.(could not tolerate simvastatin, no other statins tried)--<30% blockage bilat 07/2011   Chronic abdominal pain    Chronic fatigue and malaise    Claustrophobia    Cyst of left ovary    last imaging 06/2021, benign, no further f/u   Depression    treated in the past for years;stopped in 2010 for a years   Duodenal ulcer 1962   h/o   Dysrhythmia    ocassional PVC's, no current problems per patient on 04/02/19   Fibromyalgia    Frequent PVCs 07/2012   Seen by [facility]: benign, asymptomatic, normal EF   Gallstones 02/2021   on [place]   GERD (gastroesophageal reflux disease)    diet controlled   Glaucoma, narrow-angle    s/p laser surgery   History of hiatal hernia    during endoscopy   Hypothyroid 03/2007   IBS (irritable bowel syndrome)    Dr. Elnoria Howard   Ischemic colitis (HCC) 11/21/2018   no current problems per patient on 04/02/19   Lichenoid keratosis 12/03/2020   Dr.Stinehelfer   Nausea and vomiting 10/29/2019   Ocular migraine    Osteoporosis 04/2010   Dr.Hawkes; later consulted Dr. Sharl Ma (2022)   Panic attack    Parkinson disease (HCC)    Parkinson's disease (HCC) 06/23/2016  Recurrent UTI    has cystocele-Dr.Grewal   Shingles 1999   h/o   Superficial thrombophlebitis 03/2009   RLE   Trochanteric bursitis 12/2008   bilateral    Patient's surgical history, family medical history, social history, medications and allergies were all reviewed in Epic    Current Outpatient Medications  Medication Sig Dispense Refill   ALPRAZolam (XANAX) 0.5 MG tablet TAKE FOUR TABLETS BY MOUTH DAILY AS NEEDED FOR ANXIETY 120 tablet 0   b complex vitamins capsule Take 1 capsule by mouth daily.     Carbidopa-Levodopa ER (SINEMET CR) 25-100 MG tablet controlled release TAKE ONE TABLET THREE TIMES DAILY 270 tablet 3   Cholecalciferol (VITAMIN D-3 PO) Take 1 capsule by mouth every other day.     dicyclomine (BENTYL) 10 MG capsule TAKE 1  CAPSULE BY MOUTH EVERY 6 HOURS AS NEEDED 30 capsule 6   FLUoxetine (PROZAC) 10 MG capsule Take 1 capsule (10 mg total) by mouth daily. 30 capsule 5   polyethylene glycol (MIRALAX) 17 g packet Take 17 g by mouth daily as needed. 14 each 0   Probiotic Product (PROBIOTIC PO) Take 1 capsule by mouth daily.     SYNTHROID 25 MCG tablet TAKE 1 TABLET DAILY MONDAY THROUGH SATURDAY, SKIP SUNDAY FOR HYPOTHYROIDISM 90 tablet 1   No current facility-administered medications for this visit.    Physical Exam:     BP 130/68 (BP Location: Left Arm, Patient Position: Sitting, Cuff Size: Normal)   Pulse 68   Ht 4\' 8"  (1.422 m) Comment: height measured without shoes  Wt 82 lb 2 oz (37.3 kg)   LMP  (LMP Unknown)   BMI 18.41 kg/m   GENERAL:  Pleasant female, sitting in wheelchair PSYCH: Cooperative, normal affect EENT:  conjunctiva pink, mucous membranes moist, neck supple without masses CARDIAC:  RRR, no murmur heard, no peripheral edema PULM: Normal respiratory effort, lungs CTA bilaterally, no wheezing ABDOMEN: Mild generalized TTP without any focal points of tenderness.  No guarding or peritoneal signs.  Abdomen soft.  Normal bowel sounds NEURO: Alert and oriented x 3, no focal neurologic deficits   IMPRESSION and PLAN:    1) Constipation/IBS-C 2) Change in bowel habits 3) Abdominal cramping Decreased stool frequency and recent CT with retained colonic stool.  Discussed likely multifactorial etiology to include sedentary lifestyle, decreased p.o. intake/decreased hydration, Parkinson's, medication ADR profile, etc.  Discussed treatment options at length with patient and her daughter. - Has previously trialed Linzess, but stopped due to diarrhea.  I tried to explain that that is a common side effect early in the course and can be mitigated by dose titration.  She does not want to retrial this.  Offered other options such as Amitiza, Trulance, Motegrity, and again does not want to trial any of  these - Trial MiraLAX purge, then start MiraLAX 1 cap bid for goal of soft stools without straining to have BM - Start Benefiber - Resume Colace bid and titrate - Refill Bentyl 10 mg po prn Q6H  4) Abdominal pain This has been extensively evaluated as above, to include numerous CT scans (including 2 CT scans last month), EGD, colonoscopy, labs, ultrasound.  Suspect 2/2 underlying IBS-C, but also potentially an MSK component.  Will treat GI issues as above, but otherwise can follow-up with PCM  5) Weight loss Adult failure to thrive.  Can hopefully improve p.o. intake with treatment of constipation.  Patient's daughter asking about referral to palliative/hospice care.  I will coordinate directly  with her PCM regarding that request.          Shellia Cleverly ,DO, FACG 05/29/2023, 2:52 PM

## 2023-05-29 NOTE — Patient Instructions (Signed)
_______________________________________________________  If your blood pressure at your visit was 140/90 or greater, please contact your primary care physician to follow up on this.  _______________________________________________________  If you are age 87 or older, your body mass index should be between 23-30. Your Body mass index is 18.41 kg/m. If this is out of the aforementioned range listed, please consider follow up with your Primary Care Provider.  If you are age 58 or younger, your body mass index should be between 19-25. Your Body mass index is 18.41 kg/m. If this is out of the aformentioned range listed, please consider follow up with your Primary Care Provider.   ________________________________________________________  The [facility] providers would like to encourage you to use [facility] to communicate with providers for non-urgent requests or questions.  Due to long hold times on the telephone, sending your provider a message by [facility] may be a faster and more efficient way to get a response.  Please allow 48 business hours for a response.  Please remember that this is for non-urgent requests.  _______________________________________________________  We have sent the following medications to your pharmacy for you to pick up at your convenience:  START: bentyl 10mg  every 6 hours as needed.  It has been recommended that you complete a bowel purge (to clean out your bowels). Please do the following:  Purchase a bottle of Miralax over the counter as well as a box of 5 mg dulcolax tablets.  Take 4 dulcolax tablets.  Wait 1 hour.  You will then drink 6-8 capfuls of Miralax mixed in an adequate amount of water/juice/gatorade (you may choose which of these liquids to drink) over the next 2-3 hours.  You should expect results within 1 to 6 hours after completing the bowel purge.  It was a pleasure to see you today!  Vito Cirigliano, D.O.

## 2023-05-31 ENCOUNTER — Telehealth: Payer: Self-pay | Admitting: Family Medicine

## 2023-05-31 DIAGNOSIS — I10 Essential (primary) hypertension: Secondary | ICD-10-CM | POA: Diagnosis not present

## 2023-05-31 DIAGNOSIS — M797 Fibromyalgia: Secondary | ICD-10-CM | POA: Diagnosis not present

## 2023-05-31 DIAGNOSIS — F32A Depression, unspecified: Secondary | ICD-10-CM | POA: Diagnosis not present

## 2023-05-31 DIAGNOSIS — I25119 Atherosclerotic heart disease of native coronary artery with unspecified angina pectoris: Secondary | ICD-10-CM | POA: Diagnosis not present

## 2023-05-31 DIAGNOSIS — E46 Unspecified protein-calorie malnutrition: Secondary | ICD-10-CM | POA: Diagnosis not present

## 2023-05-31 DIAGNOSIS — G20A1 Parkinson's disease without dyskinesia, without mention of fluctuations: Secondary | ICD-10-CM | POA: Diagnosis not present

## 2023-05-31 DIAGNOSIS — K219 Gastro-esophageal reflux disease without esophagitis: Secondary | ICD-10-CM | POA: Diagnosis not present

## 2023-05-31 DIAGNOSIS — K589 Irritable bowel syndrome without diarrhea: Secondary | ICD-10-CM | POA: Diagnosis not present

## 2023-05-31 DIAGNOSIS — H409 Unspecified glaucoma: Secondary | ICD-10-CM | POA: Diagnosis not present

## 2023-05-31 DIAGNOSIS — F419 Anxiety disorder, unspecified: Secondary | ICD-10-CM | POA: Diagnosis not present

## 2023-05-31 DIAGNOSIS — E039 Hypothyroidism, unspecified: Secondary | ICD-10-CM | POA: Diagnosis not present

## 2023-05-31 DIAGNOSIS — K55059 Acute (reversible) ischemia of intestine, part and extent unspecified: Secondary | ICD-10-CM | POA: Diagnosis not present

## 2023-05-31 NOTE — Telephone Encounter (Signed)
Spoke to [place] with [facility]  She said they have you listed as Attending on record for Mr. Tangredi and she did not see anything documented about you deferring symptom care

## 2023-05-31 NOTE — Telephone Encounter (Signed)
I know I get paperwork on the husband all the time, and Dr. Ivar Drape name is on it. I usually use them for when it is comfort measures, but manage everything else.

## 2023-05-31 NOTE — Telephone Encounter (Signed)
I'd like it to be the same as we have for her husband. I believe Dr. Delanna Notice is the attending for hospice needs.

## 2023-05-31 NOTE — Telephone Encounter (Signed)
[facility]  [facility]) Archie Patten  [phone]  called  They just admitted her and they are on a deadline and only have 48 hours to get an answer   Pt wants to you be her attending Dr on record  and if you agree,they defer symptom management needs and they will also need certificate of terminal illness and that can be done verbally

## 2023-06-01 DIAGNOSIS — I25119 Atherosclerotic heart disease of native coronary artery with unspecified angina pectoris: Secondary | ICD-10-CM | POA: Diagnosis not present

## 2023-06-01 DIAGNOSIS — G20A1 Parkinson's disease without dyskinesia, without mention of fluctuations: Secondary | ICD-10-CM | POA: Diagnosis not present

## 2023-06-01 DIAGNOSIS — F32A Depression, unspecified: Secondary | ICD-10-CM | POA: Diagnosis not present

## 2023-06-01 DIAGNOSIS — F419 Anxiety disorder, unspecified: Secondary | ICD-10-CM | POA: Diagnosis not present

## 2023-06-01 DIAGNOSIS — I10 Essential (primary) hypertension: Secondary | ICD-10-CM | POA: Diagnosis not present

## 2023-06-01 DIAGNOSIS — E46 Unspecified protein-calorie malnutrition: Secondary | ICD-10-CM | POA: Diagnosis not present

## 2023-06-01 NOTE — Telephone Encounter (Signed)
Thank you for clarifying.  Diagnoses are failure to thrive (protein-calorie malnutrition), Parkinson's disease, severe depression I believe life expectance is 6 months or less.  Okay for verbal order.

## 2023-06-01 NOTE — Telephone Encounter (Signed)
Informed Tonya at [facility]

## 2023-06-01 NOTE — Telephone Encounter (Signed)
I spoke to [place] at [facility] again this morning For Mr Tracy Malone are the attending and Dr. Delanna Notice is listed as the Hospice Dr and takes care of comfort measures.  Do you want to do the same for Ms. Bovey?  Also she needs Certificate of terminal illness, verbal order that you believe she has less than 6 months to live

## 2023-06-02 DIAGNOSIS — F419 Anxiety disorder, unspecified: Secondary | ICD-10-CM | POA: Diagnosis not present

## 2023-06-02 DIAGNOSIS — F32A Depression, unspecified: Secondary | ICD-10-CM | POA: Diagnosis not present

## 2023-06-02 DIAGNOSIS — E46 Unspecified protein-calorie malnutrition: Secondary | ICD-10-CM | POA: Diagnosis not present

## 2023-06-02 DIAGNOSIS — I25119 Atherosclerotic heart disease of native coronary artery with unspecified angina pectoris: Secondary | ICD-10-CM | POA: Diagnosis not present

## 2023-06-02 DIAGNOSIS — G20A1 Parkinson's disease without dyskinesia, without mention of fluctuations: Secondary | ICD-10-CM | POA: Diagnosis not present

## 2023-06-02 DIAGNOSIS — I10 Essential (primary) hypertension: Secondary | ICD-10-CM | POA: Diagnosis not present

## 2023-06-04 ENCOUNTER — Ambulatory Visit: Payer: Medicare Other | Admitting: Family Medicine

## 2023-06-04 DIAGNOSIS — F419 Anxiety disorder, unspecified: Secondary | ICD-10-CM | POA: Diagnosis not present

## 2023-06-04 DIAGNOSIS — I10 Essential (primary) hypertension: Secondary | ICD-10-CM | POA: Diagnosis not present

## 2023-06-04 DIAGNOSIS — I25119 Atherosclerotic heart disease of native coronary artery with unspecified angina pectoris: Secondary | ICD-10-CM | POA: Diagnosis not present

## 2023-06-04 DIAGNOSIS — E46 Unspecified protein-calorie malnutrition: Secondary | ICD-10-CM | POA: Diagnosis not present

## 2023-06-04 DIAGNOSIS — F32A Depression, unspecified: Secondary | ICD-10-CM | POA: Diagnosis not present

## 2023-06-04 DIAGNOSIS — G20A1 Parkinson's disease without dyskinesia, without mention of fluctuations: Secondary | ICD-10-CM | POA: Diagnosis not present

## 2023-06-06 ENCOUNTER — Encounter: Payer: Self-pay | Admitting: Psychiatry

## 2023-06-06 ENCOUNTER — Ambulatory Visit: Payer: Medicare Other | Admitting: Psychiatry

## 2023-06-06 DIAGNOSIS — F411 Generalized anxiety disorder: Secondary | ICD-10-CM

## 2023-06-07 ENCOUNTER — Telehealth: Payer: Self-pay | Admitting: Family Medicine

## 2023-06-07 ENCOUNTER — Encounter: Payer: Self-pay | Admitting: Family Medicine

## 2023-06-07 ENCOUNTER — Telehealth: Payer: Self-pay | Admitting: *Deleted

## 2023-06-07 DIAGNOSIS — I25119 Atherosclerotic heart disease of native coronary artery with unspecified angina pectoris: Secondary | ICD-10-CM | POA: Diagnosis not present

## 2023-06-07 DIAGNOSIS — G20A1 Parkinson's disease without dyskinesia, without mention of fluctuations: Secondary | ICD-10-CM | POA: Diagnosis not present

## 2023-06-07 DIAGNOSIS — I10 Essential (primary) hypertension: Secondary | ICD-10-CM | POA: Diagnosis not present

## 2023-06-07 DIAGNOSIS — F419 Anxiety disorder, unspecified: Secondary | ICD-10-CM | POA: Diagnosis not present

## 2023-06-07 DIAGNOSIS — F32A Depression, unspecified: Secondary | ICD-10-CM | POA: Diagnosis not present

## 2023-06-07 DIAGNOSIS — E46 Unspecified protein-calorie malnutrition: Secondary | ICD-10-CM | POA: Diagnosis not present

## 2023-06-07 NOTE — Telephone Encounter (Signed)
Spoke with patient and husband. Patient is very upset--was told by hospice nurse Arlys John that she wouldn't be able to see her regular therapist, that Medicare wouldn't pay for it.  Husband gave me Brian's number [phone]). I left message to try and confirm/clarify, and if that was incorrect, to please let patient be aware. If this is true, to call me back to discuss.  I discussed role of hospice for her--in order to make sure that she wasn't feeling abandoned, but to address concerns with them first (rather than us)--that they may be able to help her easier.  That was the case here--where they could check her urine at home, rather than needing to drive here in the rain.  She was in a very poor mood.  She was prescribed an antibiotic, didn't sound like she had it yet, planned to take it in the morning. I wanted to confirm if she truly wanted to see SaraBeth tomorrow (she scheduled appt). She did--she doesn't want to take antibiotics unnecessarily. She wanted a culture done, wasn't done by Hospice. She was advised NOT to take the antibiotic prior to visit here if she wanted a culture done. She understood this.

## 2023-06-07 NOTE — Telephone Encounter (Signed)
[name] called and wanted to know what the procedure was now as far as Mrs. Hornak coming in to see you since Hospice being involved. She said you were still her PCP, so wants know if she can still make appointments with you? Just wanting to know what they can and cannot be seen for vs what they should be calling [facility] for.

## 2023-06-07 NOTE — Telephone Encounter (Signed)
Patient is going to check with hospice and let us know.

## 2023-06-07 NOTE — Telephone Encounter (Signed)
Didn't leave message on lisa's VM (didn't say her name). Called her home number, spoke to her husband. Tried to clarify role of Hospice--for her to check with them first, to make life easier, to see what they can help her with, rather than having to get transportation to our office for visits. He will pass the message on to [place], who can call back if she has additional concerns. (I then called and spoke to both Mr and Mrs Stankus to discuss things as well).

## 2023-06-07 NOTE — Telephone Encounter (Signed)
Tracy Malone called to tell you hospice came and states she has some white blood cells and no nitrates and he put her on macrobid. She also scheduled an appointment tomorrow with Les Pou.

## 2023-06-07 NOTE — Telephone Encounter (Signed)
Is this needed?

## 2023-06-08 ENCOUNTER — Encounter: Payer: Self-pay | Admitting: Nurse Practitioner

## 2023-06-08 ENCOUNTER — Ambulatory Visit (INDEPENDENT_AMBULATORY_CARE_PROVIDER_SITE_OTHER): Payer: Medicare Other | Admitting: Nurse Practitioner

## 2023-06-08 VITALS — BP 126/80 | HR 71 | Wt 81.8 lb

## 2023-06-08 DIAGNOSIS — R300 Dysuria: Secondary | ICD-10-CM | POA: Diagnosis not present

## 2023-06-08 DIAGNOSIS — Z881 Allergy status to other antibiotic agents status: Secondary | ICD-10-CM | POA: Diagnosis not present

## 2023-06-08 DIAGNOSIS — N3090 Cystitis, unspecified without hematuria: Secondary | ICD-10-CM | POA: Diagnosis not present

## 2023-06-08 DIAGNOSIS — G20A1 Parkinson's disease without dyskinesia, without mention of fluctuations: Secondary | ICD-10-CM | POA: Diagnosis not present

## 2023-06-08 LAB — POCT URINALYSIS DIP (CLINITEK)
Bilirubin, UA: NEGATIVE
Glucose, UA: NEGATIVE mg/dL
Ketones, POC UA: NEGATIVE mg/dL
Nitrite, UA: NEGATIVE
POC PROTEIN,UA: NEGATIVE
Spec Grav, UA: <=1.005 — AB (ref 1.010–1.025)
Urobilinogen, UA: 0.2 U/dL
pH, UA: 6 (ref 5.0–8.0)

## 2023-06-08 NOTE — Progress Notes (Signed)
Tollie Eth, DNP, AGNP-c [facility] [address] [place], [place] [address] [phone]   ACUTE VISIT- ESTABLISHED PATIENT  Blood pressure 126/80, pulse 71, weight 81 lb 12.8 oz (37.1 kg), SpO2 100%.  Subjective:  HPI ISMARI FENRICH is a 89 y.o. female presents to day for evaluation of acute concern(s).   History of Present Illness The patient, with a history of Parkinson's disease, presents with a urinary tract infection (UTI). She reports experiencing burning and irritation, which she attributes to the infection. The patient has been prescribed Macrobid for the UTI by home hospice but has not started taking it. She has a history of strong reactions to certain antibiotics, but she has not had issues with Macrobid in the past. Currently allergy list includes 38 medications.   The patient also reports significant stress and anxiety due to personal circumstances, including the illness of family members. She describes feeling extremely tired, which she believes may be due to the UTI. She also reports lower back pain, which started a few days prior to the consultation. The pain is located in the middle of the lower back and is associated with a burning sensation. The patient fell recently but does not believe this fall caused the back pain.  The patient also reports feeling "tight" in the abdomen, particularly on one side. She fell recently but does not believe this fall caused the abdominal discomfort. She also reports a lack of appetite and difficulty sleeping. She has been trying to maintain her nutrition by consuming milkshakes with protein powder.  ROS negative except for what is listed in HPI. History, Medications, Surgery, SDOH, and Family History reviewed and updated as appropriate.  Objective:  Physical Exam Vitals and nursing note reviewed.  Constitutional:      General: She is in acute distress.     Comments: Emotionally distraught.  Eyes:      Conjunctiva/sclera: Conjunctivae normal.  Neck:     Vascular: No carotid bruit.  Cardiovascular:     Rate and Rhythm: Normal rate and regular rhythm.     Pulses: Normal pulses.     Heart sounds: Normal heart sounds.  Pulmonary:     Effort: Pulmonary effort is normal.     Breath sounds: Normal breath sounds.  Abdominal:     General: Bowel sounds are normal. There is no distension.     Palpations: Abdomen is soft. There is no mass.     Tenderness: There is abdominal tenderness. There is guarding. There is no right CVA tenderness, left CVA tenderness or rebound.  Musculoskeletal:        General: Tenderness present.     Right lower leg: No edema.     Left lower leg: No edema.  Skin:    General: Skin is warm and dry.     Capillary Refill: Capillary refill takes less than 2 seconds.  Neurological:     General: No focal deficit present.     Mental Status: She is alert.     Motor: Weakness present.     Gait: Gait abnormal.  Psychiatric:        Attention and Perception: Attention normal.        Mood and Affect: Mood is depressed. Affect is flat and tearful.        Speech: Speech normal.        Behavior: Behavior is withdrawn.        Thought Content: Thought content normal.        Cognition and  Memory: Memory is impaired.          Assessment & Plan:   Problem List Items Addressed This Visit     Allergy to multiple antibiotics    Difficulty in management of UTI given her significant antibiotic allergies and adverse effects. If current treatment is not effective, consider fosfomycin for management. Will await culture results for final determination.       Parkinson's disease (HCC)    Contributing to overall fatigue and complicating UTI management. Reports increased stress and anxiety, exacerbating symptoms. Discussed supportive measures and potential referral to a counselor for talk therapy. - Continue current Parkinson's disease management - Address stress and anxiety through  supportive measures and potential referral to a counselor      Cystitis - Primary    Presents with symptoms of burning and irritation, consistent with a UTI. Reports lower back pain, a common symptom associated with UTIs. Parkinson's disease may complicate the UTI. Started on Macrobid (Nitrofurantoin). Discussed potential for a culture to confirm the infection and adjust treatment if necessary. Expressed concerns about taking additional antibiotics, particularly Cipro, due to past adverse reactions. Discussed alternative antibiotic Rosadan if culture indicates need for stronger treatment. Informed about potential side effects of Macrobid and possibility of yeast infections. Prefers to avoid additional pills and is open to a one-time shot if needed. - Continue Macrobid (Nitrofurantoin) 100 mg twice daily for 14 days - Send urine culture to confirm infection - Consider alternative antibiotic if culture indicates need for stronger treatment (fosfomycin may be appropriate) - Monitor for side effects and efficacy of treatment      Relevant Orders   Urine Culture (Completed)   POCT URINALYSIS DIP (CLINITEK) (Completed)   Chronic abdominal pain noted with difficulty in assessment of acute pain. No alarm symptoms are present on examination.   Tollie Eth, DNP, AGNP-c  Time: 34 minutes, >50% spent counseling, care coordination, chart review, and documentation.

## 2023-06-09 DIAGNOSIS — I10 Essential (primary) hypertension: Secondary | ICD-10-CM | POA: Diagnosis not present

## 2023-06-09 DIAGNOSIS — F419 Anxiety disorder, unspecified: Secondary | ICD-10-CM | POA: Diagnosis not present

## 2023-06-09 DIAGNOSIS — I25119 Atherosclerotic heart disease of native coronary artery with unspecified angina pectoris: Secondary | ICD-10-CM | POA: Diagnosis not present

## 2023-06-09 DIAGNOSIS — F32A Depression, unspecified: Secondary | ICD-10-CM | POA: Diagnosis not present

## 2023-06-09 DIAGNOSIS — E46 Unspecified protein-calorie malnutrition: Secondary | ICD-10-CM | POA: Diagnosis not present

## 2023-06-09 DIAGNOSIS — G20A1 Parkinson's disease without dyskinesia, without mention of fluctuations: Secondary | ICD-10-CM | POA: Diagnosis not present

## 2023-06-11 ENCOUNTER — Other Ambulatory Visit: Payer: Self-pay | Admitting: Family Medicine

## 2023-06-11 NOTE — Telephone Encounter (Signed)
Pt called to check on urine culture, advised her not in yet   She states she can't take macrobid, caused stomach to burn and she couldn't urinate and pharmacist told her not to take

## 2023-06-11 NOTE — Telephone Encounter (Signed)
Does she want to try something else, or wait for culture? I pended an rx for fosfomycin. I don't believe she has ever taken this. It is a one time treatment.

## 2023-06-11 NOTE — Telephone Encounter (Signed)
It can take a few days to get the culture back, even longer to find out if the macrobid was the correct antibiotic (to see which medications are sensitive or resistant). If she can take it twice daily, she might be better by the time the results come back.  She needs to drink a lot of water. How many doses has she taken, and has she been voiding since??  Pt saw SaraBeth, so lab results will be going to her.  Cc'ing message to her for when results come.

## 2023-06-12 ENCOUNTER — Other Ambulatory Visit: Payer: Self-pay | Admitting: *Deleted

## 2023-06-12 DIAGNOSIS — I10 Essential (primary) hypertension: Secondary | ICD-10-CM | POA: Diagnosis not present

## 2023-06-12 DIAGNOSIS — E46 Unspecified protein-calorie malnutrition: Secondary | ICD-10-CM | POA: Diagnosis not present

## 2023-06-12 DIAGNOSIS — F419 Anxiety disorder, unspecified: Secondary | ICD-10-CM | POA: Diagnosis not present

## 2023-06-12 DIAGNOSIS — G20A1 Parkinson's disease without dyskinesia, without mention of fluctuations: Secondary | ICD-10-CM | POA: Diagnosis not present

## 2023-06-12 DIAGNOSIS — I25119 Atherosclerotic heart disease of native coronary artery with unspecified angina pectoris: Secondary | ICD-10-CM | POA: Diagnosis not present

## 2023-06-12 DIAGNOSIS — F32A Depression, unspecified: Secondary | ICD-10-CM | POA: Diagnosis not present

## 2023-06-12 MED ORDER — FOSFOMYCIN TROMETHAMINE 3 G PO PACK: 3.0000 g | PACK | Freq: Once | ORAL | 0 refills | Status: AC

## 2023-06-12 NOTE — Telephone Encounter (Signed)
Spoke with patient and she would like the rx, but she wants sent to [facility].

## 2023-06-13 LAB — URINE CULTURE

## 2023-06-14 ENCOUNTER — Telehealth: Payer: Self-pay | Admitting: Family Medicine

## 2023-06-14 DIAGNOSIS — E46 Unspecified protein-calorie malnutrition: Secondary | ICD-10-CM | POA: Diagnosis not present

## 2023-06-14 DIAGNOSIS — G20A1 Parkinson's disease without dyskinesia, without mention of fluctuations: Secondary | ICD-10-CM | POA: Diagnosis not present

## 2023-06-14 DIAGNOSIS — I10 Essential (primary) hypertension: Secondary | ICD-10-CM | POA: Diagnosis not present

## 2023-06-14 DIAGNOSIS — F419 Anxiety disorder, unspecified: Secondary | ICD-10-CM | POA: Diagnosis not present

## 2023-06-14 DIAGNOSIS — F32A Depression, unspecified: Secondary | ICD-10-CM | POA: Diagnosis not present

## 2023-06-14 DIAGNOSIS — I25119 Atherosclerotic heart disease of native coronary artery with unspecified angina pectoris: Secondary | ICD-10-CM | POA: Diagnosis not present

## 2023-06-14 NOTE — Telephone Encounter (Signed)
Ensure she isn't having any rash or hives.  That would indicate an allergic reaction. Otherwise, symptoms can be nonspecific, related to other things (including postnasal drainage, allergies, etc).  I'm glad to hear that her urinary symptoms improved

## 2023-06-14 NOTE — Telephone Encounter (Signed)
Patient notified

## 2023-06-14 NOTE — Telephone Encounter (Signed)
Patient called back and said the burning is so severe under her rib cage she wants to know if she is having a reaction from the abx she took the other day. I reassured her once again that it is not. She said she needs to take her carbidopa/levodopa and she needs to know if she can take it and please ask doctor Lynelle Doctor and please hurry.

## 2023-06-14 NOTE — Telephone Encounter (Signed)
Please call re urine culture results   States she doesn't feel well, not sure if from meds but "her body feels strange"

## 2023-06-14 NOTE — Telephone Encounter (Signed)
Forwarding to you. I called her before and she was not having rash or hives. We talked about other things, PND and allergies. She was pleasant and said she would call back if she felt like she started developing cold symptoms.

## 2023-06-14 NOTE — Telephone Encounter (Signed)
Tell her I'm so glad that your urinary symptoms resolved (the culture is still pending). I'm sorry that you is having the burning in her stomach.  This does NOT sound like an allergic reaction, and doesn't get in the way of her taking her carbidopa/levadopa.  She can try some mylanta or maalox for burning under her ribs, or an omeprazole, if needed.  If it was a side effect from her medication (not an allergy), this should improve on its own.

## 2023-06-14 NOTE — Telephone Encounter (Signed)
Called patient back, she took the abx Tuesday. Urinary symptoms are completely gone as well as back pain. She is still having abdominal burning. Last night starting having hoarseness and trouble swallowing water, neck feels swollen. I asked her if she might be getting sick, she said no. I told her I would send you a message, she asked if she could be having a reaction to the abx.

## 2023-06-14 NOTE — Telephone Encounter (Signed)
Spoke with patient and she is not having any rashes, hives. She will call if anything changes.

## 2023-06-14 NOTE — Telephone Encounter (Signed)
Pt called c/o upper abd pain on both sides near rib cage, burning pain  States started last night, getting worse not sure if from meds    states she needs some help Pt crying/ moaning during call

## 2023-06-15 ENCOUNTER — Telehealth: Payer: Self-pay | Admitting: Family Medicine

## 2023-06-15 ENCOUNTER — Ambulatory Visit: Payer: Medicare Other | Admitting: Psychiatry

## 2023-06-15 DIAGNOSIS — F411 Generalized anxiety disorder: Secondary | ICD-10-CM | POA: Diagnosis not present

## 2023-06-15 NOTE — Telephone Encounter (Signed)
Please advise about urine culture.

## 2023-06-15 NOTE — Telephone Encounter (Signed)
Pt called to check on urine culture  thinks she still has bladder infection  Not feeling well, light headed " just not doing well"

## 2023-06-15 NOTE — Progress Notes (Signed)
[facility] Counselor/Therapist Progress Note  Patient ID: Tracy Malone, MRN: [id number],    Date: 06/15/2023  Time Spent: 45 minutes   Treatment Type: Individual Therapy  Virtual Visit via Telehealth Note; Telephone session as patient is not able to do the video session Connected with patient by a telemedicine/telehealth application, with their informed consent, and verified patient privacy and that I am speaking with the correct person using two identifiers. I discussed the limitations, risks, security and privacy concerns of performing psychotherapy and the availability of in person appointments. I also discussed with the patient that there may be a patient responsible charge related to this service. The patient expressed understanding and agreed to proceed. I discussed the treatment planning with the patient. The patient was provided an opportunity to ask questions and all were answered. The patient agreed with the plan and demonstrated an understanding of the instructions. The patient was advised to call  our office if  symptoms worsen or feel they are in a crisis state and need immediate contact.   Therapist Location: office Patient Location: home   Reported Symptoms: anxiety "is my stronger symptom", some trouble walking and a bit shaky   Mental Status Exam:  Appearance:   N/A   telehealth visit      Behavior:  Appropriate, Sharing, and difficulty motivating due to some physical issues for which she is being treated  Motor:  Uses rollator to walk  Speech/Language:   Clear and Coherent  Affect:  N/A   telehealth visit  Mood:  anxious and depressed  Thought process:  goal directed  Thought content:    Rumination  Sensory/Perceptual disturbances:    WNL  Orientation:  oriented to person, place, time/date, situation, day of week, month of year, year, and stated date of Nov. 22, 2024  Attention:  Good  Concentration:  Fair  Memory:  WNL  Fund of knowledge:   Good   Insight:    Good and Fair  Judgment:   Good  Impulse Control:  Good   Risk Assessment: Danger to Self:  No Self-injurious Behavior: No Danger to Others: No Duty to Warn:no Physical Aggression / Violence:No  Access to Firearms a concern: No  Gang Involvement:No   Subjective:  Patient in telehealth session today and reports anxiety, worrying, depression, and physical issues going on. Has notice her appetite being lower also and states her medical doctor is aware of all her symptoms.  Denies any SI.  States she may go under Hospice care but hasn't made that decision yet, adding that Hospice is to check back with them. Needed session today to talk through her frustrating medical concerns, and more importantly her mental/emotional health. Is really having a difficult time with her and her husband's aging and having increased physical issues particularly and having to make some decisions as noted above, but wanting to stay as independent as possible. Very difficult and sad situation for patient and did seem to feel better after talking more in depth and feeling heard.  Did discuss with patient, as she had questions, about what happens if she gets treatments for it and do hospice care with him visiting her in her home.  I explained to patient, that with hospice, she could also have counseling as she was concerned about not having a counselor at some point.  She seems to now be considering that as an option and is to let them know within the next few days.  Her husband is  already being overseen by hospice care and they both continue to live in their home together but their physical needs are increasing.  It does seem that for both of them to be under hospice care would be an advantage for them and could help with multiple different things, especially since they are no longer able to drive and get out to medical or therapy appointments and it is difficult for them to always have a ride available.  Physically,  it sounds like patient is having increased challenges and I supported her in their thinking through the decisions about allowing hospice to help support them in care further needs.  Was more calm and less tearful near end of session but also having recurring physical pain and she agreed that she was calling her doctor's office for further advice.  Interventions: Cognitive Behavioral Therapy and Ego-Supportive  Long term goal: Develop the ability to recognize, accept, and cope with feelings of anxiety and depression. Short term goal: Verbalize any unresolved grief issues that may be contributing to anxiety and depression. Strategy: Replace negative self-defeating self talk with verbalization of realistic and positive cognitive messages to lessen depression/anxiety and improve mood.  Diagnosis:   ICD-10-CM   1. Generalized anxiety disorder  F41.1      Plan: Patient today participating in session and focusing on several issues but primarily her anxiety, worrying, and depression.  Denies any SI.  Did well and talking about her increasing physical health challenges which also impact her emotional health challenges.  Her husband is also having increased physical issues.  They are in the midst of talking with family and to make a decision about patient also going under hospice care in near future, as her husband is already being followed by hospice as so far, they have been able to remain in their home.  If they go under hospice care, they will then be eligible to see a hospice counselor and this therapist will end my working with this patient.  Patient participated well in session and seemed more open to the possibility of going under hospice care. Encouraged patient and practicing positive/self affirming behaviors including: Remaining on her prescribed medication, trying to refrain from assuming worst-case scenarios, reach out to her extended family via phone calls, listen to the music that she loves and  finds calming, and continue letting others help her more at this point in her life at 89 years of age, and with her having increased medical issues and less stability in her being mobile.  Will wait and learn family's decision about patient going under hospice care or not, and if she does, we will close out my serving as therapist for this patient and encouraged her as she steps forward with hospice.  Goal review and progress/challenges noted with patient.   Mathis Fare, LCSW

## 2023-06-17 DIAGNOSIS — G20A1 Parkinson's disease without dyskinesia, without mention of fluctuations: Secondary | ICD-10-CM | POA: Diagnosis not present

## 2023-06-17 DIAGNOSIS — I10 Essential (primary) hypertension: Secondary | ICD-10-CM | POA: Diagnosis not present

## 2023-06-17 DIAGNOSIS — I25119 Atherosclerotic heart disease of native coronary artery with unspecified angina pectoris: Secondary | ICD-10-CM | POA: Diagnosis not present

## 2023-06-17 DIAGNOSIS — E46 Unspecified protein-calorie malnutrition: Secondary | ICD-10-CM | POA: Diagnosis not present

## 2023-06-17 DIAGNOSIS — F419 Anxiety disorder, unspecified: Secondary | ICD-10-CM | POA: Diagnosis not present

## 2023-06-17 DIAGNOSIS — F32A Depression, unspecified: Secondary | ICD-10-CM | POA: Diagnosis not present

## 2023-06-18 ENCOUNTER — Telehealth: Payer: Self-pay | Admitting: Family Medicine

## 2023-06-18 ENCOUNTER — Encounter: Payer: Self-pay | Admitting: Nurse Practitioner

## 2023-06-18 ENCOUNTER — Ambulatory Visit: Payer: Medicare Other | Admitting: Family Medicine

## 2023-06-18 NOTE — Assessment & Plan Note (Signed)
Difficulty in management of UTI given her significant antibiotic allergies and adverse effects. If current treatment is not effective, consider fosfomycin for management. Will await culture results for final determination.

## 2023-06-18 NOTE — Telephone Encounter (Signed)
Pt called this morning c/o side effects of her medication, feet and heels hurting and peeling and she just did not feel well Spoke with Dr Lynelle Doctor and advised her that her antibiotic was one time dose 1 week ago and any possible side effects she may have felt she be improving. Advised her I can schedule her this morning with Dr Lynelle Doctor or she can call her Hospice doctor and she states she will call Hospice doctor.  Pt also asked about urine culture, per Dr. Lynelle Doctor culture did show infection but it did not specifically test for the antibiotic she was on and advised her that she could have her urine rechecked and she can have the Hospice doctor do that

## 2023-06-18 NOTE — Assessment & Plan Note (Signed)
Contributing to overall fatigue and complicating UTI management. Reports increased stress and anxiety, exacerbating symptoms. Discussed supportive measures and potential referral to a counselor for talk therapy. - Continue current Parkinson's disease management - Address stress and anxiety through supportive measures and potential referral to a counselor

## 2023-06-18 NOTE — Assessment & Plan Note (Signed)
Presents with symptoms of burning and irritation, consistent with a UTI. Reports lower back pain, a common symptom associated with UTIs. Parkinson's disease may complicate the UTI. Started on Macrobid (Nitrofurantoin). Discussed potential for a culture to confirm the infection and adjust treatment if necessary. Expressed concerns about taking additional antibiotics, particularly Cipro, due to past adverse reactions. Discussed alternative antibiotic Rosadan if culture indicates need for stronger treatment. Informed about potential side effects of Macrobid and possibility of yeast infections. Prefers to avoid additional pills and is open to a one-time shot if needed. - Continue Macrobid (Nitrofurantoin) 100 mg twice daily for 14 days - Send urine culture to confirm infection - Consider alternative antibiotic if culture indicates need for stronger treatment (fosfomycin may be appropriate) - Monitor for side effects and efficacy of treatment

## 2023-06-19 NOTE — Progress Notes (Signed)
No charge- duplicate

## 2023-06-19 NOTE — Telephone Encounter (Signed)
Patient was treated with fosfomycin by her PCP for the infection 11/20 with review of results at that time. 11/21 resolution of urinary symptoms reported. If she is having recurrence of symptoms, a new urine culture will be required to ensure that this is infectious in nature so we can be sure what we are trying to treat.

## 2023-06-19 NOTE — Progress Notes (Signed)
Error- no charge.

## 2023-06-20 ENCOUNTER — Ambulatory Visit: Payer: BLUE CROSS/BLUE SHIELD | Admitting: Psychiatry

## 2023-06-20 ENCOUNTER — Ambulatory Visit: Payer: Medicare Other | Admitting: Family Medicine

## 2023-06-20 VITALS — BP 130/70 | HR 64 | Ht <= 58 in | Wt 83.2 lb

## 2023-06-20 DIAGNOSIS — Z87440 Personal history of urinary (tract) infections: Secondary | ICD-10-CM

## 2023-06-20 DIAGNOSIS — F32A Depression, unspecified: Secondary | ICD-10-CM | POA: Diagnosis not present

## 2023-06-20 DIAGNOSIS — F332 Major depressive disorder, recurrent severe without psychotic features: Secondary | ICD-10-CM | POA: Diagnosis not present

## 2023-06-20 DIAGNOSIS — I25119 Atherosclerotic heart disease of native coronary artery with unspecified angina pectoris: Secondary | ICD-10-CM | POA: Diagnosis not present

## 2023-06-20 DIAGNOSIS — R1030 Lower abdominal pain, unspecified: Secondary | ICD-10-CM

## 2023-06-20 DIAGNOSIS — E46 Unspecified protein-calorie malnutrition: Secondary | ICD-10-CM | POA: Diagnosis not present

## 2023-06-20 DIAGNOSIS — I10 Essential (primary) hypertension: Secondary | ICD-10-CM | POA: Diagnosis not present

## 2023-06-20 DIAGNOSIS — N3090 Cystitis, unspecified without hematuria: Secondary | ICD-10-CM

## 2023-06-20 DIAGNOSIS — F419 Anxiety disorder, unspecified: Secondary | ICD-10-CM | POA: Diagnosis not present

## 2023-06-20 DIAGNOSIS — G20A1 Parkinson's disease without dyskinesia, without mention of fluctuations: Secondary | ICD-10-CM | POA: Diagnosis not present

## 2023-06-20 LAB — POCT URINALYSIS DIP (PROADVANTAGE DEVICE)
Bilirubin, UA: NEGATIVE
Blood, UA: NEGATIVE
Glucose, UA: NEGATIVE mg/dL
Ketones, POC UA: NEGATIVE mg/dL
Leukocytes, UA: NEGATIVE
Nitrite, UA: NEGATIVE
Protein Ur, POC: NEGATIVE mg/dL
Specific Gravity, Urine: 1.01
Urobilinogen, Ur: 0.2
pH, UA: 6 (ref 5.0–8.0)

## 2023-06-20 NOTE — Patient Instructions (Addendum)
Please have Arlys John (hospice nurse) call Dr. Lynelle Doctor.   I left a voicemail for him 1-2 weeks ago, and he didn't return my call. We need to discuss your other doctors, who you can/cannot see. You may want to have your therapist to speak with him as well, so that we can make sure that you have ongoing counseling (either through your regular therapist or someone else).   Use vaseline or eucerin ointment to the dry areas of the foot.  Use a small amount, covered with a cotton sock, and wear slippers.  Be sure to be careful so you don't fall. You should follow up with your podiatrist for the corn/wart that is causing you pain on the right foot.  If you notice a cut/split in your skin, use bacitracin or neosporin ointment to help prevent infection until the skin heals.  I don't see any reaction to the antibiotic you took for the bladder infection.  I'm not concerned about your swelling in the feet (none today and your lungs sounded good).

## 2023-06-20 NOTE — Telephone Encounter (Signed)
Spoke to patient. Pt is not having any urinary symptoms but thinks she is having an allergic reaction to the antibiotic she took. She has rash/redness on her feet. She hasn't looked at the rest of her body as she is afriad too but she is having peeling.   She is scheduled for 4pm today for the rash on her feet

## 2023-06-20 NOTE — Progress Notes (Signed)
Chief Complaint  Patient presents with   Consult    Patient started having rash/redness on her feet last Thursday. Called back on Monday 11/25 and they had begun peeling. She is also stating that her stomach is sore, not burning when she urinates or frequency. Just like someone hit her in the stomach with a 2x4. Fell into the toilet this am and hurt her back.     Patient is accompanied by a friend today. Chief complaints are her feet peeling, which she relates to recent antibiotic to treat UTI.  She was prescribed macrobid by Hospice, but she preferred to be seen in office and have culture taken prior to starting the antibiotic. She started the antibiotic, only took 1 dose, then developed urinary retention, had to be catheterized by hospice. She reports she has been voiding well since then.  Urine culture was obtained at visit 11/15, showing infection with citrobacter amalonaticus.  She took one dose of fosfomycin on 06/12/23. She didn't feel great with the fosfomycin, but not as bad as with cipro in the pst.  Currently she has slight discomfort across the lower stomach. No burning or pain with urination. Denies odor or cloudiness. Prior to ABX she also had pain in the back. She thinks the antibiotic helped, no longer has the back discomfort.  She started having redness of her feet, followed by peeling on 11/21. Not itchy, painful. L toe painful--due to dropping peanut butter on the toe  She fell today--hit her back on the back of the toilet (husband left the toilet up).  She thinks she strained her R arm keeping her from falling in.  Denies any swelling.   PMH, PSH, SH reviewed  Outpatient Encounter Medications as of 06/20/2023  Medication Sig Note   ALPRAZolam (XANAX) 0.5 MG tablet TAKE FOUR TABLETS BY MOUTH DAILY AS NEEDED FOR ANXIETY 06/20/2023: Took one this am   Carbidopa-Levodopa ER (SINEMET CR) 25-100 MG tablet controlled release TAKE ONE TABLET THREE TIMES DAILY    dicyclomine  (BENTYL) 10 MG capsule Take 1 capsule (10 mg total) by mouth every 6 (six) hours as needed for spasms. 06/20/2023: Took one last night   polyethylene glycol (MIRALAX) 17 g packet Take 17 g by mouth daily as needed. 06/20/2023: Took last night   SYNTHROID 25 MCG tablet TAKE 1 TABLET DAILY MONDAY THROUGH SATURDAY, SKIP SUNDAY FOR HYPOTHYROIDISM    b complex vitamins capsule Take 1 capsule by mouth daily. (Patient not taking: Reported on 06/20/2023) 06/20/2023: Did not take today   Cholecalciferol (VITAMIN D-3 PO) Take 1 capsule by mouth every other day. (Patient not taking: Reported on 06/20/2023) 06/20/2023: Did not take today   FLUoxetine (PROZAC) 10 MG capsule Take 1 capsule (10 mg total) by mouth daily. (Patient not taking: Reported on 06/20/2023) 06/20/2023: Did not take today   Probiotic Product (PROBIOTIC PO) Take 1 capsule by mouth daily. (Patient not taking: Reported on 06/20/2023) 06/20/2023: Did not take today   No facility-administered encounter medications on file as of 06/20/2023.   Allergies  Allergen Reactions   Iodinated Contrast Media Anaphylaxis   Iodine Anaphylaxis    IV and topical forms. Other reaction(s): Unknown   Levsin [Hyoscyamine Sulfate]     Vision problems/pt has glaucoma   Salmon [Fish Allergy] Hives and Shortness Of Breath   Shellfish Allergy Anaphylaxis   Tramadol Swelling   Remeron [Mirtazapine] Other (See Comments)    Cause blurred vision and red eyes, pt has glaucoma   Aspirin Other (See  Comments)    Sever stomach pain due to ulcer scaring.   Bis Subcit-Metronid-Tetracyc Swelling    Tongue swelling. Face tingling Other reaction(s): Unknown   Cephalexin Hives    Other reaction(s): hives   Ciprofloxacin Diarrhea   Codeine Nausea And Vomiting   Cyclobenzaprine Other (See Comments)    Tingly/prickly sensation. Other reaction(s): tingly/prickly sensation   Darvocet [Propoxyphene N-Acetaminophen] Nausea And Vomiting   Demerol [Meperidine] Nausea Only    Dexlansoprazole Swelling    Redness, swelling and peeling of both feet. Other reaction(s): foot pain   Diphedryl [Diphenhydramine] Other (See Comments)    Increased pulse/small amount ok   Doxycycline Hyclate Other (See Comments)    GI intolerance.   Doxycycline Hyclate     Other reaction(s): GI intolerance   Epinephrine Other (See Comments)    Breathing problems Other reaction(s): breathing problems/fainting   Erythromycin Other (See Comments)    GI intolerance. Other reaction(s): GI   Fish Oil     Other reaction(s): breathing problems/hives   Hyoscyamine     Other reaction(s): eye pain   Latex Other (See Comments)    Gloves ok.  Skin gets red from elastic in underwear and latex bandaides.   Meperidine Hcl     Other reaction(s): vomiting   Nitrofurantoin Diarrhea   Other     Other reaction(s): migraines Other reaction(s): Unknown Other reaction(s): Unknown Other reaction(s): Unknown Other reaction(s): increased pulse, faint, diarrhea   Prednisone Other (See Comments)    Headache Other reaction(s): headache   Ra Diphedryl Allergy [Diphenhydramine Hcl]     Other reaction(s): increased pulse small dose okay   Sertraline Hcl Swelling and Other (See Comments)    Migraine Swelling of tongue/lip (09/2012) Other reaction(s): Unknown   Shellfish-Derived Products     Other reaction(s): Unknown   Sulfa Antibiotics Other (See Comments)    Increased pulse, fainting, diarrhea, thrush   Wellbutrin [Bupropion] Other (See Comments)    Headaches   Xylocaine [Lidocaine Hcl]     With epinephrine, given by dentist.  Speeded up heart rate and she passed out (occured twice, at dentist)   Xylocaine [Lidocaine]     Other reaction(s): Unknown   Biaxin [Clarithromycin] Rash    Started after completing 10 day course of 2000 mg /day, Lips swelling   Ibuprofen Other (See Comments)    Motrin ok with a GI effect. Other reaction(s): rash Motrin okay with a GI effect   Penicillins Hives and Rash     ROS: no fever, chills, cold symptoms.  Rare dry cough No n/v/d. No urinary complaints. Some lower abdominal mild discomfort. She reports swelling in her feet, more in the mornings rather than later in the day. +depression    PHYSICAL EXAM:  BP 130/70   Pulse 64   Ht 4\' 8"  (1.422 m)   Wt 83 lb 3.2 oz (37.7 kg)   LMP  (LMP Unknown)   BMI 18.65 kg/m   Wt Readings from Last 3 Encounters:  06/20/23 83 lb 3.2 oz (37.7 kg)  06/08/23 81 lb 12.8 oz (37.1 kg)  05/29/23 82 lb 2 oz (37.3 kg)   Thin, frail female, in no distress HEENT: conjunctiva and sclera are clear, EOMI Heart: regular rate and rhythm Lungs: clear, slight squeak/ronchi intermittently at the base on left Abdomen: mildly tender across lower abdomen Extremities: no edema, normal pulses. There is erythema with some peeling at the top of the L great toe (where she had dropped something on it), healing. Corn/wart at base  of left 5th MTP, mildly tender Slightly thickened area at the heel, some flaking, rest appears normal. No erythema. Psych: depressed mood, full range of affect Neuro: alert and oriented, slow but normal gait    ASSESSMENT/PLAN:  Lower abdominal pain - mild, reassured - Plan: POCT Urinalysis DIP (Proadvantage Device)  History of UTI - s/p fosfomycin. Discussed culture results, sensitivity for this ABX not checked. Clinically improved, and repeat urine dip normal. Reassured  Protein-calorie malnutrition, unspecified severity (HCC) - she brought in a low calorie protein shake, asking if okay to take. Discouraged--prefer more caloric supplement, without milk  Severe recurrent major depression without psychotic features [facility]) - asked her to have hospice nurse contact me; need to address her counseling/therapy. She needs to be getting therapy

## 2023-06-22 ENCOUNTER — Encounter: Payer: Self-pay | Admitting: Family Medicine

## 2023-06-24 DIAGNOSIS — I25119 Atherosclerotic heart disease of native coronary artery with unspecified angina pectoris: Secondary | ICD-10-CM | POA: Diagnosis not present

## 2023-06-24 DIAGNOSIS — K219 Gastro-esophageal reflux disease without esophagitis: Secondary | ICD-10-CM | POA: Diagnosis not present

## 2023-06-24 DIAGNOSIS — K589 Irritable bowel syndrome without diarrhea: Secondary | ICD-10-CM | POA: Diagnosis not present

## 2023-06-24 DIAGNOSIS — E039 Hypothyroidism, unspecified: Secondary | ICD-10-CM | POA: Diagnosis not present

## 2023-06-24 DIAGNOSIS — F419 Anxiety disorder, unspecified: Secondary | ICD-10-CM | POA: Diagnosis not present

## 2023-06-24 DIAGNOSIS — H409 Unspecified glaucoma: Secondary | ICD-10-CM | POA: Diagnosis not present

## 2023-06-24 DIAGNOSIS — G20A1 Parkinson's disease without dyskinesia, without mention of fluctuations: Secondary | ICD-10-CM | POA: Diagnosis not present

## 2023-06-24 DIAGNOSIS — E46 Unspecified protein-calorie malnutrition: Secondary | ICD-10-CM | POA: Diagnosis not present

## 2023-06-24 DIAGNOSIS — M797 Fibromyalgia: Secondary | ICD-10-CM | POA: Diagnosis not present

## 2023-06-24 DIAGNOSIS — F32A Depression, unspecified: Secondary | ICD-10-CM | POA: Diagnosis not present

## 2023-06-24 DIAGNOSIS — I10 Essential (primary) hypertension: Secondary | ICD-10-CM | POA: Diagnosis not present

## 2023-06-24 DIAGNOSIS — K55059 Acute (reversible) ischemia of intestine, part and extent unspecified: Secondary | ICD-10-CM | POA: Diagnosis not present

## 2023-06-25 DIAGNOSIS — E46 Unspecified protein-calorie malnutrition: Secondary | ICD-10-CM | POA: Diagnosis not present

## 2023-06-25 DIAGNOSIS — F419 Anxiety disorder, unspecified: Secondary | ICD-10-CM | POA: Diagnosis not present

## 2023-06-25 DIAGNOSIS — G20A1 Parkinson's disease without dyskinesia, without mention of fluctuations: Secondary | ICD-10-CM | POA: Diagnosis not present

## 2023-06-25 DIAGNOSIS — I10 Essential (primary) hypertension: Secondary | ICD-10-CM | POA: Diagnosis not present

## 2023-06-25 DIAGNOSIS — I25119 Atherosclerotic heart disease of native coronary artery with unspecified angina pectoris: Secondary | ICD-10-CM | POA: Diagnosis not present

## 2023-06-25 DIAGNOSIS — F32A Depression, unspecified: Secondary | ICD-10-CM | POA: Diagnosis not present

## 2023-06-26 DIAGNOSIS — G20A1 Parkinson's disease without dyskinesia, without mention of fluctuations: Secondary | ICD-10-CM | POA: Diagnosis not present

## 2023-06-26 DIAGNOSIS — I10 Essential (primary) hypertension: Secondary | ICD-10-CM | POA: Diagnosis not present

## 2023-06-26 DIAGNOSIS — F32A Depression, unspecified: Secondary | ICD-10-CM | POA: Diagnosis not present

## 2023-06-26 DIAGNOSIS — I25119 Atherosclerotic heart disease of native coronary artery with unspecified angina pectoris: Secondary | ICD-10-CM | POA: Diagnosis not present

## 2023-06-26 DIAGNOSIS — F419 Anxiety disorder, unspecified: Secondary | ICD-10-CM | POA: Diagnosis not present

## 2023-06-26 DIAGNOSIS — E46 Unspecified protein-calorie malnutrition: Secondary | ICD-10-CM | POA: Diagnosis not present

## 2023-06-27 ENCOUNTER — Ambulatory Visit: Payer: BLUE CROSS/BLUE SHIELD | Admitting: Psychiatry

## 2023-06-27 DIAGNOSIS — I25119 Atherosclerotic heart disease of native coronary artery with unspecified angina pectoris: Secondary | ICD-10-CM | POA: Diagnosis not present

## 2023-06-27 DIAGNOSIS — E46 Unspecified protein-calorie malnutrition: Secondary | ICD-10-CM | POA: Diagnosis not present

## 2023-06-27 DIAGNOSIS — F32A Depression, unspecified: Secondary | ICD-10-CM | POA: Diagnosis not present

## 2023-06-27 DIAGNOSIS — F419 Anxiety disorder, unspecified: Secondary | ICD-10-CM | POA: Diagnosis not present

## 2023-06-27 DIAGNOSIS — G20A1 Parkinson's disease without dyskinesia, without mention of fluctuations: Secondary | ICD-10-CM | POA: Diagnosis not present

## 2023-06-27 DIAGNOSIS — I10 Essential (primary) hypertension: Secondary | ICD-10-CM | POA: Diagnosis not present

## 2023-06-28 ENCOUNTER — Other Ambulatory Visit: Payer: Self-pay | Admitting: Neurology

## 2023-06-28 DIAGNOSIS — I10 Essential (primary) hypertension: Secondary | ICD-10-CM | POA: Diagnosis not present

## 2023-06-28 DIAGNOSIS — F32A Depression, unspecified: Secondary | ICD-10-CM | POA: Diagnosis not present

## 2023-06-28 DIAGNOSIS — G20A1 Parkinson's disease without dyskinesia, without mention of fluctuations: Secondary | ICD-10-CM | POA: Diagnosis not present

## 2023-06-28 DIAGNOSIS — E46 Unspecified protein-calorie malnutrition: Secondary | ICD-10-CM | POA: Diagnosis not present

## 2023-06-28 DIAGNOSIS — I25119 Atherosclerotic heart disease of native coronary artery with unspecified angina pectoris: Secondary | ICD-10-CM | POA: Diagnosis not present

## 2023-06-28 DIAGNOSIS — F419 Anxiety disorder, unspecified: Secondary | ICD-10-CM | POA: Diagnosis not present

## 2023-06-28 NOTE — Telephone Encounter (Signed)
Last seen on 02/26/23 Follow up scheduled 09/05/23 Last filled on 06/28/23 #30 tablets by Kern Reap, MD

## 2023-07-02 ENCOUNTER — Encounter: Payer: Self-pay | Admitting: Neurology

## 2023-07-02 DIAGNOSIS — G20A1 Parkinson's disease without dyskinesia, without mention of fluctuations: Secondary | ICD-10-CM | POA: Diagnosis not present

## 2023-07-02 DIAGNOSIS — F419 Anxiety disorder, unspecified: Secondary | ICD-10-CM | POA: Diagnosis not present

## 2023-07-02 DIAGNOSIS — F32A Depression, unspecified: Secondary | ICD-10-CM | POA: Diagnosis not present

## 2023-07-02 DIAGNOSIS — E46 Unspecified protein-calorie malnutrition: Secondary | ICD-10-CM | POA: Diagnosis not present

## 2023-07-02 DIAGNOSIS — I25119 Atherosclerotic heart disease of native coronary artery with unspecified angina pectoris: Secondary | ICD-10-CM | POA: Diagnosis not present

## 2023-07-02 DIAGNOSIS — I10 Essential (primary) hypertension: Secondary | ICD-10-CM | POA: Diagnosis not present

## 2023-07-03 DIAGNOSIS — F32A Depression, unspecified: Secondary | ICD-10-CM | POA: Diagnosis not present

## 2023-07-03 DIAGNOSIS — G20A1 Parkinson's disease without dyskinesia, without mention of fluctuations: Secondary | ICD-10-CM | POA: Diagnosis not present

## 2023-07-03 DIAGNOSIS — I25119 Atherosclerotic heart disease of native coronary artery with unspecified angina pectoris: Secondary | ICD-10-CM | POA: Diagnosis not present

## 2023-07-03 DIAGNOSIS — I10 Essential (primary) hypertension: Secondary | ICD-10-CM | POA: Diagnosis not present

## 2023-07-03 DIAGNOSIS — F419 Anxiety disorder, unspecified: Secondary | ICD-10-CM | POA: Diagnosis not present

## 2023-07-03 DIAGNOSIS — E46 Unspecified protein-calorie malnutrition: Secondary | ICD-10-CM | POA: Diagnosis not present

## 2023-07-04 ENCOUNTER — Ambulatory Visit: Payer: BLUE CROSS/BLUE SHIELD | Admitting: Psychiatry

## 2023-07-04 DIAGNOSIS — G20A1 Parkinson's disease without dyskinesia, without mention of fluctuations: Secondary | ICD-10-CM | POA: Diagnosis not present

## 2023-07-04 DIAGNOSIS — I25119 Atherosclerotic heart disease of native coronary artery with unspecified angina pectoris: Secondary | ICD-10-CM | POA: Diagnosis not present

## 2023-07-04 DIAGNOSIS — F32A Depression, unspecified: Secondary | ICD-10-CM | POA: Diagnosis not present

## 2023-07-04 DIAGNOSIS — I10 Essential (primary) hypertension: Secondary | ICD-10-CM | POA: Diagnosis not present

## 2023-07-04 DIAGNOSIS — E46 Unspecified protein-calorie malnutrition: Secondary | ICD-10-CM | POA: Diagnosis not present

## 2023-07-04 DIAGNOSIS — F419 Anxiety disorder, unspecified: Secondary | ICD-10-CM | POA: Diagnosis not present

## 2023-07-05 ENCOUNTER — Emergency Department (HOSPITAL_BASED_OUTPATIENT_CLINIC_OR_DEPARTMENT_OTHER): Payer: Medicare Other

## 2023-07-05 ENCOUNTER — Inpatient Hospital Stay (HOSPITAL_BASED_OUTPATIENT_CLINIC_OR_DEPARTMENT_OTHER)
Admission: EM | Admit: 2023-07-05 | Discharge: 2023-07-11 | DRG: 480 | Disposition: A | Discharge status: Skilled Nursing Facility | Payer: Medicare Other | Attending: Internal Medicine | Admitting: Internal Medicine

## 2023-07-05 ENCOUNTER — Other Ambulatory Visit: Payer: Self-pay

## 2023-07-05 DIAGNOSIS — Z8249 Family history of ischemic heart disease and other diseases of the circulatory system: Secondary | ICD-10-CM

## 2023-07-05 DIAGNOSIS — Z79899 Other long term (current) drug therapy: Secondary | ICD-10-CM

## 2023-07-05 DIAGNOSIS — G20A1 Parkinson's disease without dyskinesia, without mention of fluctuations: Secondary | ICD-10-CM | POA: Diagnosis present

## 2023-07-05 DIAGNOSIS — M79651 Pain in right thigh: Secondary | ICD-10-CM | POA: Diagnosis not present

## 2023-07-05 DIAGNOSIS — S5291XA Unspecified fracture of right forearm, initial encounter for closed fracture: Secondary | ICD-10-CM | POA: Diagnosis not present

## 2023-07-05 DIAGNOSIS — E46 Unspecified protein-calorie malnutrition: Secondary | ICD-10-CM | POA: Diagnosis not present

## 2023-07-05 DIAGNOSIS — M810 Age-related osteoporosis without current pathological fracture: Secondary | ICD-10-CM | POA: Diagnosis present

## 2023-07-05 DIAGNOSIS — Z888 Allergy status to other drugs, medicaments and biological substances status: Secondary | ICD-10-CM

## 2023-07-05 DIAGNOSIS — Z681 Body mass index (BMI) 19 or less, adult: Secondary | ICD-10-CM | POA: Diagnosis not present

## 2023-07-05 DIAGNOSIS — R627 Adult failure to thrive: Secondary | ICD-10-CM | POA: Diagnosis present

## 2023-07-05 DIAGNOSIS — I6529 Occlusion and stenosis of unspecified carotid artery: Secondary | ICD-10-CM | POA: Diagnosis present

## 2023-07-05 DIAGNOSIS — Z8261 Family history of arthritis: Secondary | ICD-10-CM

## 2023-07-05 DIAGNOSIS — I25119 Atherosclerotic heart disease of native coronary artery with unspecified angina pectoris: Secondary | ICD-10-CM | POA: Diagnosis not present

## 2023-07-05 DIAGNOSIS — Z91013 Allergy to seafood: Secondary | ICD-10-CM

## 2023-07-05 DIAGNOSIS — S42291A Other displaced fracture of upper end of right humerus, initial encounter for closed fracture: Secondary | ICD-10-CM | POA: Diagnosis not present

## 2023-07-05 DIAGNOSIS — M79641 Pain in right hand: Secondary | ICD-10-CM | POA: Diagnosis not present

## 2023-07-05 DIAGNOSIS — Z885 Allergy status to narcotic agent status: Secondary | ICD-10-CM

## 2023-07-05 DIAGNOSIS — R1312 Dysphagia, oropharyngeal phase: Secondary | ICD-10-CM | POA: Diagnosis present

## 2023-07-05 DIAGNOSIS — Z9841 Cataract extraction status, right eye: Secondary | ICD-10-CM

## 2023-07-05 DIAGNOSIS — S72001A Fracture of unspecified part of neck of right femur, initial encounter for closed fracture: Secondary | ICD-10-CM | POA: Diagnosis not present

## 2023-07-05 DIAGNOSIS — W19XXXA Unspecified fall, initial encounter: Secondary | ICD-10-CM

## 2023-07-05 DIAGNOSIS — I10 Essential (primary) hypertension: Secondary | ICD-10-CM | POA: Diagnosis not present

## 2023-07-05 DIAGNOSIS — Z833 Family history of diabetes mellitus: Secondary | ICD-10-CM

## 2023-07-05 DIAGNOSIS — Z66 Do not resuscitate: Secondary | ICD-10-CM | POA: Diagnosis present

## 2023-07-05 DIAGNOSIS — S72044A Nondisplaced fracture of base of neck of right femur, initial encounter for closed fracture: Secondary | ICD-10-CM | POA: Diagnosis present

## 2023-07-05 DIAGNOSIS — F411 Generalized anxiety disorder: Secondary | ICD-10-CM

## 2023-07-05 DIAGNOSIS — M797 Fibromyalgia: Secondary | ICD-10-CM | POA: Diagnosis present

## 2023-07-05 DIAGNOSIS — Z91040 Latex allergy status: Secondary | ICD-10-CM

## 2023-07-05 DIAGNOSIS — Z91041 Radiographic dye allergy status: Secondary | ICD-10-CM

## 2023-07-05 DIAGNOSIS — M85841 Other specified disorders of bone density and structure, right hand: Secondary | ICD-10-CM | POA: Diagnosis not present

## 2023-07-05 DIAGNOSIS — E039 Hypothyroidism, unspecified: Secondary | ICD-10-CM | POA: Diagnosis present

## 2023-07-05 DIAGNOSIS — Z9842 Cataract extraction status, left eye: Secondary | ICD-10-CM

## 2023-07-05 DIAGNOSIS — R5382 Chronic fatigue, unspecified: Secondary | ICD-10-CM | POA: Diagnosis present

## 2023-07-05 DIAGNOSIS — D72829 Elevated white blood cell count, unspecified: Secondary | ICD-10-CM | POA: Diagnosis not present

## 2023-07-05 DIAGNOSIS — F419 Anxiety disorder, unspecified: Secondary | ICD-10-CM | POA: Diagnosis present

## 2023-07-05 DIAGNOSIS — M79631 Pain in right forearm: Secondary | ICD-10-CM | POA: Diagnosis not present

## 2023-07-05 DIAGNOSIS — M25551 Pain in right hip: Secondary | ICD-10-CM | POA: Diagnosis not present

## 2023-07-05 DIAGNOSIS — H4020X0 Unspecified primary angle-closure glaucoma, stage unspecified: Secondary | ICD-10-CM | POA: Diagnosis present

## 2023-07-05 DIAGNOSIS — R69 Illness, unspecified: Secondary | ICD-10-CM | POA: Diagnosis not present

## 2023-07-05 DIAGNOSIS — Z881 Allergy status to other antibiotic agents status: Secondary | ICD-10-CM

## 2023-07-05 DIAGNOSIS — Z515 Encounter for palliative care: Secondary | ICD-10-CM

## 2023-07-05 DIAGNOSIS — Y9301 Activity, walking, marching and hiking: Secondary | ICD-10-CM | POA: Diagnosis present

## 2023-07-05 DIAGNOSIS — E440 Moderate protein-calorie malnutrition: Secondary | ICD-10-CM | POA: Diagnosis present

## 2023-07-05 DIAGNOSIS — W010XXA Fall on same level from slipping, tripping and stumbling without subsequent striking against object, initial encounter: Secondary | ICD-10-CM | POA: Diagnosis present

## 2023-07-05 DIAGNOSIS — H9313 Tinnitus, bilateral: Secondary | ICD-10-CM | POA: Diagnosis not present

## 2023-07-05 DIAGNOSIS — K589 Irritable bowel syndrome without diarrhea: Secondary | ICD-10-CM | POA: Diagnosis present

## 2023-07-05 DIAGNOSIS — Z886 Allergy status to analgesic agent status: Secondary | ICD-10-CM | POA: Diagnosis not present

## 2023-07-05 DIAGNOSIS — Z79890 Hormone replacement therapy (postmenopausal): Secondary | ICD-10-CM

## 2023-07-05 DIAGNOSIS — F32A Depression, unspecified: Secondary | ICD-10-CM | POA: Diagnosis not present

## 2023-07-05 DIAGNOSIS — M5030 Other cervical disc degeneration, unspecified cervical region: Secondary | ICD-10-CM | POA: Diagnosis not present

## 2023-07-05 DIAGNOSIS — Z043 Encounter for examination and observation following other accident: Secondary | ICD-10-CM | POA: Diagnosis not present

## 2023-07-05 DIAGNOSIS — S42201A Unspecified fracture of upper end of right humerus, initial encounter for closed fracture: Secondary | ICD-10-CM | POA: Diagnosis not present

## 2023-07-05 DIAGNOSIS — S199XXA Unspecified injury of neck, initial encounter: Secondary | ICD-10-CM | POA: Diagnosis not present

## 2023-07-05 DIAGNOSIS — I739 Peripheral vascular disease, unspecified: Secondary | ICD-10-CM | POA: Diagnosis present

## 2023-07-05 DIAGNOSIS — Z90710 Acquired absence of both cervix and uterus: Secondary | ICD-10-CM

## 2023-07-05 DIAGNOSIS — S0990XA Unspecified injury of head, initial encounter: Secondary | ICD-10-CM | POA: Diagnosis not present

## 2023-07-05 DIAGNOSIS — Z801 Family history of malignant neoplasm of trachea, bronchus and lung: Secondary | ICD-10-CM

## 2023-07-05 DIAGNOSIS — Z882 Allergy status to sulfonamides status: Secondary | ICD-10-CM

## 2023-07-05 DIAGNOSIS — Z87440 Personal history of urinary (tract) infections: Secondary | ICD-10-CM

## 2023-07-05 DIAGNOSIS — Z880 Allergy status to penicillin: Secondary | ICD-10-CM

## 2023-07-05 DIAGNOSIS — M19041 Primary osteoarthritis, right hand: Secondary | ICD-10-CM | POA: Diagnosis not present

## 2023-07-05 LAB — CBC
HCT: 34.8 % — ABNORMAL LOW (ref 36.0–46.0)
Hemoglobin: 11.6 g/dL — ABNORMAL LOW (ref 12.0–15.0)
MCH: 31.4 pg (ref 26.0–34.0)
MCHC: 33.3 g/dL (ref 30.0–36.0)
MCV: 94.3 fL (ref 80.0–100.0)
Platelets: 196 10*3/uL (ref 150–400)
RBC: 3.69 MIL/uL — ABNORMAL LOW (ref 3.87–5.11)
RDW: 13.1 % (ref 11.5–15.5)
WBC: 19.3 10*3/uL — ABNORMAL HIGH (ref 4.0–10.5)
nRBC: 0 % (ref 0.0–0.2)

## 2023-07-05 MED ORDER — CARBIDOPA-LEVODOPA ER 25-100 MG PO TBCR
1.0000 | EXTENDED_RELEASE_TABLET | Freq: Three times a day (TID) | ORAL | Status: DC
  Administered 2023-07-06 – 2023-07-10 (×3): 1 via ORAL
  Filled 2023-07-05 (×16): qty 1

## 2023-07-05 MED ORDER — HYDROMORPHONE HCL 1 MG/ML IJ SOLN
0.5000 mg | Freq: Once | INTRAMUSCULAR | Status: AC
  Administered 2023-07-05: 0.5 mg via INTRAVENOUS
  Filled 2023-07-05: qty 1

## 2023-07-05 NOTE — Progress Notes (Addendum)
Plan of Care Note for accepted transfer   Patient: Tracy Malone MRN: [id number]   DOA: 07/05/2023  Facility requesting transfer: [facility]   Requesting Provider: Dr. Jarold Motto   Reason for transfer: Right hip fracture, right proximal humerus fracture   Facility course: 89 yr old female with Parkinson Disease on home hospice d/t failure to thrive presents with right arm and right thigh pain after a trip and fall.   She is right-handed and typically ambulates with a cane.   Plain radiographs demonstrate proximal right humerus fracture. Right hip fracture is seen on CT.   Orthopedic surgery (Dr. Christell Constant) was consulted by the ED physician and recommended medical admission to [facility].   Plan of care: The patient is accepted for admission to Med-surg  unit, at [facility].   Author: Briscoe Deutscher, MD 07/05/2023  Check [email or web address] for on-call coverage.  Nursing staff, Please call [facility] Admits & Consults System-Wide number on Amion as soon as patient's arrival, so appropriate admitting provider can evaluate the pt.

## 2023-07-05 NOTE — ED Triage Notes (Signed)
Tripped at home. Landed on right side. Pain and obvious deformity to right upper arm, Right thigh pain. Pelvis stable with lateral and anterior pressure. Hospice home care on scene when EMS arrived. Given 100 mcg Fentanyl by EMS.  DNR at bedside.

## 2023-07-05 NOTE — ED Provider Notes (Signed)
[place] EMERGENCY DEPARTMENT AT [facility] Provider Note   CSN: [id number] Arrival date & time: 07/05/23  2126     History  Chief Complaint  Patient presents with   Marletta Lor    Tracy Malone is a 89 y.o. female.  89 year old female with history of failure to thrive on hospice and Parkinson's disease who presents to the emergency department after a fall.  Patient was walking away and tripped on something and fell onto her right side.  Unsure if she hit her head.  Has been having severe pain in her right arm since.  Also has been having some pain in her right hip.  911 was called and she was given fentanyl and brought to the emergency department.  Not on blood thinners.  Lives at home with her husband and typically walks with a cane.  Is right-handed.       Home Medications Prior to Admission medications   Medication Sig Start Date End Date Taking? Authorizing Provider  ALPRAZolam (XANAX) 0.5 MG tablet TAKE FOUR TABLETS BY MOUTH DAILY AS NEEDED FOR ANXIETY Patient taking differently: Take 0.5 mg by mouth in the morning, at noon, in the evening, and at bedtime. 05/22/23  Yes Mozingo, Thereasa Solo, NP  b complex vitamins capsule Take 1 capsule by mouth daily.   Yes [provider]  Carbidopa-Levodopa ER (SINEMET CR) 25-100 MG tablet controlled release TAKE ONE TABLET BY MOUTH THREE TIMES A DAY 06/28/23  Yes Sater, Pearletha Furl, MD  Cholecalciferol (VITAMIN D-3 PO) Take 1 capsule by mouth 3 (three) times a week.   Yes [provider]  dicyclomine (BENTYL) 10 MG capsule Take 1 capsule (10 mg total) by mouth every 6 (six) hours as needed for spasms. Patient taking differently: Take 10 mg by mouth at bedtime as needed for spasms. 05/29/23  Yes Cirigliano, Vito V, DO  FLUoxetine (PROZAC) 10 MG capsule Take 1 capsule (10 mg total) by mouth daily. 05/10/23  Yes Mozingo, Thereasa Solo, NP  polyethylene glycol (MIRALAX) 17 g packet Take 17 g by mouth daily as  needed. Patient taking differently: Take 17 g by mouth daily. 04/20/22  Yes Gareth Eagle, PA-C  Probiotic Product (PROBIOTIC PO) Take 1 capsule by mouth 2 (two) times a week.   Yes [provider]  SYNTHROID 25 MCG tablet TAKE 1 TABLET DAILY MONDAY THROUGH SATURDAY, SKIP SUNDAY FOR HYPOTHYROIDISM 03/15/23  Yes Joselyn Arrow, MD      Allergies    Iodinated contrast media, Iodine, Levsin [hyoscyamine sulfate], Salmon [fish allergy], Shellfish allergy, Tramadol, Remeron [mirtazapine], Aspirin, Bis subcit-metronid-tetracyc, Cephalexin, Ciprofloxacin, Codeine, Cyclobenzaprine, Darvocet [propoxyphene n-acetaminophen], Demerol [meperidine], Dexlansoprazole, Diphedryl [diphenhydramine], Doxycycline hyclate, Doxycycline hyclate, Epinephrine, Erythromycin, Fish oil, Hyoscyamine, Latex, Meperidine hcl, Nitrofurantoin, Nsaids, Other, Prednisone, Ra diphedryl allergy [diphenhydramine hcl], Sertraline hcl, Shellfish-derived products, Sulfa antibiotics, Wellbutrin [bupropion], Xylocaine [lidocaine hcl], Xylocaine [lidocaine], Biaxin [clarithromycin], Ibuprofen, and Penicillins    Review of Systems   Review of Systems  Physical Exam Updated Vital Signs BP 106/70   Pulse 96   Temp 98.1 F (36.7 C) (Axillary)   Resp 18   LMP  (LMP Unknown)   SpO2 95%  Physical Exam Vitals and nursing note reviewed.  Constitutional:      General: She is not in acute distress.    Appearance: Normal appearance. She is well-developed. She is not ill-appearing.  HENT:     Head: Normocephalic.     Comments: Some bruising to bridge of the nose on the right.  Right Ear: External ear normal.     Left Ear: External ear normal.     Nose: Nose normal.     Mouth/Throat:     Mouth: Mucous membranes are moist.     Pharynx: Oropharynx is clear.  Eyes:     Extraocular Movements: Extraocular movements intact.     Conjunctiva/sclera: Conjunctivae normal.     Pupils: Pupils are equal, round, and reactive to light.  Neck:      Comments: No C-spine midline tenderness to palpation Cardiovascular:     Rate and Rhythm: Normal rate and regular rhythm.     Pulses: Normal pulses.     Heart sounds: Normal heart sounds. No murmur heard.    Comments: Right radial pulse 2+ Pulmonary:     Effort: Pulmonary effort is normal. No respiratory distress.     Breath sounds: Normal breath sounds.  Abdominal:     General: Abdomen is flat. There is no distension.     Palpations: Abdomen is soft. There is no mass.     Tenderness: There is no abdominal tenderness. There is no guarding.  Musculoskeletal:        General: Deformity (Right upper extremity) present. Normal range of motion.     Cervical back: Normal range of motion and neck supple. No rigidity or tenderness.     Right lower leg: No edema.     Left lower leg: No edema.     Comments: No tenderness to palpation of midline thoracic or lumbar spine.  No step-offs palpated.  No tenderness to palpation of chest wall.  No bruising noted.  No tenderness to palpation of bilateral clavicles.  No tenderness to palpation, bruising, or deformities noted of bilateral wrists, knees, or ankles.  Tenderness to palpation of right hip and femur.  Tenderness to palpation of right shoulder and right elbow.  Skin:    General: Skin is warm and dry.  Neurological:     General: No focal deficit present.     Mental Status: She is alert and oriented to person, place, and time. Mental status is at baseline.     Cranial Nerves: No cranial nerve deficit.     Sensory: No sensory deficit.     Motor: No weakness.  Psychiatric:        Mood and Affect: Mood normal.     ED Results / Procedures / Treatments   Labs (all labs ordered are listed, but only abnormal results are displayed) Labs Reviewed  BASIC METABOLIC PANEL - Abnormal; Notable for the following components:      Result Value   Glucose, Bld 134 (*)    All other components within normal limits  CBC - Abnormal; Notable for the  following components:   WBC 19.3 (*)    RBC 3.69 (*)    Hemoglobin 11.6 (*)    HCT 34.8 (*)    All other components within normal limits    EKG None  Radiology CT Hip Right Wo Contrast Result Date: 07/05/2023 CLINICAL DATA:  Recent fall with leg pain, initial encounter EXAM: CT OF THE RIGHT HIP WITHOUT CONTRAST TECHNIQUE: Multidetector CT imaging of the right hip was performed according to the standard protocol. Multiplanar CT image reconstructions were also generated. RADIATION DOSE REDUCTION: This exam was performed according to the departmental dose-optimization program which includes automated exposure control, adjustment of the mA and/or kV according to patient size and/or use of iterative reconstruction technique. COMPARISON:  Films from earlier in the same day. FINDINGS: Bones/Joint/Cartilage  There is an right impacted subcapital femoral neck fracture identified. No dislocation is seen. No intratrochanteric component is noted. No other fractures are seen. No joint effusion is noted. Ligaments Suboptimally assessed by CT. Muscles and Tendons Surrounding musculature appears within normal limits. Soft tissues No soft tissue abnormality is noted. The visualized pelvic structures are within limits. IMPRESSION: Impacted subcapital femoral neck fracture on the right. Electronically Signed   By: Alcide Clever M.D.   On: 07/05/2023 23:59   DG HIP PORT UNILAT WITH PELVIS 1V RIGHT Result Date: 07/05/2023 CLINICAL DATA:  Fall, pain EXAM: DG HIP (WITH OR WITHOUT PELVIS) 1V PORT RIGHT COMPARISON:  None Available. FINDINGS: No definite femoral neck fracture seen on this single view. AP view on the pelvis series is concerning for impacted femoral neck fracture. No subluxation or dislocation. IMPRESSION: No definite fracture seen on this single view. Consider further evaluation with CT given the appearance on pelvis series. Electronically Signed   By: Charlett Nose M.D.   On: 07/05/2023 23:29   DG Shoulder  Right Result Date: 07/05/2023 CLINICAL DATA:  Right shoulder pain, fall EXAM: RIGHT SHOULDER - 2+ VIEW COMPARISON:  None Available. FINDINGS: Proximal right humeral fracture involving the right humeral neck and greater tuberosity region. No subluxation or dislocation. IMPRESSION: Right humeral neck/head fracture. Electronically Signed   By: Charlett Nose M.D.   On: 07/05/2023 23:27   CT Cervical Spine Wo Contrast Result Date: 07/05/2023 CLINICAL DATA:  Neck trauma (Age >= 65y).  Fall. EXAM: CT CERVICAL SPINE WITHOUT CONTRAST TECHNIQUE: Multidetector CT imaging of the cervical spine was performed without intravenous contrast. Multiplanar CT image reconstructions were also generated. RADIATION DOSE REDUCTION: This exam was performed according to the departmental dose-optimization program which includes automated exposure control, adjustment of the mA and/or kV according to patient size and/or use of iterative reconstruction technique. COMPARISON:  None available FINDINGS: Alignment: Normal Skull base and vertebrae: No acute fracture. No primary bone lesion or focal pathologic process. Soft tissues and spinal canal: No prevertebral fluid or swelling. No visible canal hematoma. Disc levels: Mild diffuse degenerative disc disease and facet disease. Upper chest: No acute findings Other: None IMPRESSION: No acute bony abnormality. Electronically Signed   By: Charlett Nose M.D.   On: 07/05/2023 23:23   CT Head Wo Contrast Result Date: 07/05/2023 CLINICAL DATA:  Head trauma, minor (Age >= 65y).  Fall. EXAM: CT HEAD WITHOUT CONTRAST TECHNIQUE: Contiguous axial images were obtained from the base of the skull through the vertex without intravenous contrast. RADIATION DOSE REDUCTION: This exam was performed according to the departmental dose-optimization program which includes automated exposure control, adjustment of the mA and/or kV according to patient size and/or use of iterative reconstruction technique. COMPARISON:   None Available. FINDINGS: Brain: There is atrophy and chronic small vessel disease changes. No acute intracranial abnormality. Specifically, no hemorrhage, hydrocephalus, mass lesion, acute infarction, or significant intracranial injury. Vascular: No hyperdense vessel or unexpected calcification. Skull: No acute calvarial abnormality. Sinuses/Orbits: No acute findings Other: None IMPRESSION: Atrophy, chronic microvascular disease. No acute intracranial abnormality. Electronically Signed   By: Charlett Nose M.D.   On: 07/05/2023 23:21   DG Pelvis Portable Result Date: 07/05/2023 CLINICAL DATA:  Fall EXAM: PORTABLE PELVIS 1-2 VIEWS COMPARISON:  None Available. FINDINGS: There is foreshortening of the right femoral neck concerning for impacted fracture. No subluxation or dislocation. IMPRESSION: Concern for impacted right femoral neck fracture. Electronically Signed   By: Charlett Nose M.D.   On: 07/05/2023 23:09  DG Hand Complete Right Result Date: 07/05/2023 CLINICAL DATA:  Fall, right side pain EXAM: RIGHT HAND - COMPLETE 3+ VIEW COMPARISON:  None Available. 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 Diffuse osteopenia. No acute bony abnormality. Specifically, no fracture, subluxation, or dislocation. Soft tissues are intact. IMPRESSION: No acute bony abnormality. Electronically Signed   By: Charlett Nose M.D.   On: 07/05/2023 23:04   DG Forearm Right Result Date: 07/05/2023 CLINICAL DATA:  Fall, right side pain EXAM: RIGHT FOREARM - 2 VIEW COMPARISON:  None Available. FINDINGS: There is no evidence of fracture or other focal bone lesions. Soft tissues are unremarkable. IMPRESSION: Negative. Electronically Signed   By: Charlett Nose M.D.   On: 07/05/2023 23:04   DG Humerus Right Result Date: 07/05/2023 CLINICAL DATA:  Fall, right side pain EXAM: RIGHT HUMERUS - 2+ VIEW COMPARISON:  None Available. FINDINGS: There is a proximal right humeral fracture involving the greater tuberosity region of  the humeral head. Extension across the humeral neck suspected but difficult to visualize. No subluxation or dislocation. IMPRESSION: Proximal right humeral fracture involving the greater tuberosity and possibly humeral neck. Electronically Signed   By: Charlett Nose M.D.   On: 07/05/2023 23:03   DG Femur 1 View Right Result Date: 07/05/2023 CLINICAL DATA:  Fall, pain EXAM: RIGHT FEMUR 1 VIEW COMPARISON:  None Available. FINDINGS: Limited single view of the right femur. No visible fracture, subluxation or dislocation. IMPRESSION: Limited study.  No visible acute bony abnormality. Electronically Signed   By: Charlett Nose M.D.   On: 07/05/2023 23:03   DG Chest Portable 1 View Result Date: 07/05/2023 CLINICAL DATA:  Fall EXAM: PORTABLE CHEST 1 VIEW COMPARISON:  01/30/2022 FINDINGS: Heart and mediastinal contours are within normal limits. No focal opacities or effusions. Proximal right humeral fracture in the region of the humeral neck/head. See humerus series report. IMPRESSION: No acute cardiopulmonary disease. Proximal right humeral fracture. Electronically Signed   By: Charlett Nose M.D.   On: 07/05/2023 23:02    Procedures Procedures    Medications Ordered in ED Medications  Carbidopa-Levodopa ER (SINEMET CR) 25-100 MG tablet controlled release 1 tablet (1 tablet Oral Patient Refused/Not Given 07/06/23 1124)  HYDROmorphone (DILAUDID) injection 0.5-1 mg (1 mg Intravenous Given 07/06/23 1118)  ALPRAZolam (XANAX) tablet 0.5 mg (0.5 mg Oral Given 07/06/23 1123)  FLUoxetine (PROZAC) capsule 10 mg (10 mg Oral Patient Refused/Not Given 07/06/23 1124)  levothyroxine (SYNTHROID) tablet 25 mcg (25 mcg Oral Patient Refused/Not Given 07/06/23 1124)  dicyclomine (BENTYL) capsule 10 mg (has no administration in time range)  polyethylene glycol (MIRALAX / GLYCOLAX) packet 17 g (17 g Oral Patient Refused/Not Given 07/06/23 1124)  acidophilus (RISAQUAD) capsule 1 capsule (has no administration in time range)   methocarbamol (ROBAXIN) tablet 500 mg (has no administration in time range)    Or  methocarbamol (ROBAXIN) injection 500 mg (has no administration in time range)  HYDROmorphone (DILAUDID) injection 0.5 mg (0.5 mg Intravenous Given 07/05/23 2153)  HYDROmorphone (DILAUDID) injection 0.5 mg (0.5 mg Intravenous Given 07/05/23 2352)  ALPRAZolam (XANAX) tablet 0.5 mg (0.5 mg Oral Given 07/06/23 0027)  HYDROmorphone (DILAUDID) injection 1 mg (1 mg Intravenous Given 07/06/23 0208)  HYDROmorphone (DILAUDID) injection 0.5 mg (0.5 mg Intravenous Given 07/06/23 4098)    ED Course/ Medical Decision Making/ A&P Clinical Course as of 07/06/23 1230  Thu Jul 05, 2023  2327 Dr Christell Constant from ortho recommending admission to [facility] and CT of the hip.  [RP]  2347 Admitted to hospitalist  by Dr Antionette Char [RP]    Clinical Course User Index [RP] Rondel Baton, MD                                 Medical Decision Making Amount and/or Complexity of Data Reviewed Labs: ordered. Radiology: ordered.  Risk Prescription drug management. Decision regarding hospitalization.   Tracy Malone is a 89 y.o. female with comorbidities that complicate the patient evaluation including history of failure to thrive on hospice and Parkinson's disease who presents to the emergency department after a fall.    Initial Ddx:  Mechanical fall, TBI, C-spine injury, proximal humerus fracture, humerus fracture, hand fracture, hip fracture  MDM/Course:  Patient resents emergency department with right shoulder and right lower extremity pain after a fall.  Unclear if she hit her head or not.  On exam is mentating well without any significant signs of head trauma.  Does have tenderness to palpation of the right upper extremity as well as the right hip/femur.  Given her age did obtain a CT head and C-spine which did not show acute abnormality.  Does have a proximal humerus fracture as well as a possible impacted femoral neck fracture.   Discussed with orthopedics who recommends transfer to [facility].  Also obtaining a CT scan of the hip at this time.  Patient pain able to be controlled with Dilaudid and upon re-evaluation is feeling much better.  Admitted to hospitalist for further management.  This patient presents to the ED for concern of complaints listed in HPI, this involves an extensive number of treatment options, and is a complaint that carries with it a high risk of complications and morbidity. Disposition including potential need for admission considered.   Dispo: Admit to Step Down  Additional history obtained from daughter Records reviewed Outpatient Clinic Notes The following labs were independently interpreted: Chemistry and show no acute abnormality I independently reviewed the following imaging with scope of interpretation limited to determining acute life threatening conditions related to emergency care: Extremity x-ray(s) and agree with the radiologist interpretation with the following exceptions: none I personally reviewed and interpreted cardiac monitoring: normal sinus rhythm  I personally reviewed and interpreted the pt's EKG: see above for interpretation  I have reviewed the patients home medications and made adjustments as needed Consults: Hospitalist and Orthopedics Social Determinants of health:  Elderly  Portions of this note were generated with Scientist, clinical (histocompatibility and immunogenetics). Dictation errors may occur despite best attempts at proofreading.     Final Clinical Impression(s) / ED Diagnoses Final diagnoses:  Closed fracture of proximal end of right humerus, unspecified fracture morphology, initial encounter  Closed fracture of neck of right femur, initial encounter (HCC)  Fall, initial encounter    Rx / DC Orders ED Discharge Orders     None         Rondel Baton, MD 07/06/23 1230

## 2023-07-06 ENCOUNTER — Encounter (HOSPITAL_COMMUNITY): Payer: Self-pay | Admitting: Family Medicine

## 2023-07-06 DIAGNOSIS — Z8249 Family history of ischemic heart disease and other diseases of the circulatory system: Secondary | ICD-10-CM | POA: Diagnosis not present

## 2023-07-06 DIAGNOSIS — S42301D Unspecified fracture of shaft of humerus, right arm, subsequent encounter for fracture with routine healing: Secondary | ICD-10-CM | POA: Diagnosis not present

## 2023-07-06 DIAGNOSIS — M797 Fibromyalgia: Secondary | ICD-10-CM | POA: Diagnosis present

## 2023-07-06 DIAGNOSIS — H9313 Tinnitus, bilateral: Secondary | ICD-10-CM | POA: Diagnosis not present

## 2023-07-06 DIAGNOSIS — G250 Essential tremor: Secondary | ICD-10-CM | POA: Diagnosis not present

## 2023-07-06 DIAGNOSIS — K219 Gastro-esophageal reflux disease without esophagitis: Secondary | ICD-10-CM | POA: Diagnosis not present

## 2023-07-06 DIAGNOSIS — E440 Moderate protein-calorie malnutrition: Secondary | ICD-10-CM | POA: Diagnosis not present

## 2023-07-06 DIAGNOSIS — B029 Zoster without complications: Secondary | ICD-10-CM | POA: Diagnosis not present

## 2023-07-06 DIAGNOSIS — F419 Anxiety disorder, unspecified: Secondary | ICD-10-CM | POA: Diagnosis not present

## 2023-07-06 DIAGNOSIS — Z91041 Radiographic dye allergy status: Secondary | ICD-10-CM | POA: Diagnosis not present

## 2023-07-06 DIAGNOSIS — F32A Depression, unspecified: Secondary | ICD-10-CM | POA: Diagnosis not present

## 2023-07-06 DIAGNOSIS — S72044A Nondisplaced fracture of base of neck of right femur, initial encounter for closed fracture: Secondary | ICD-10-CM | POA: Diagnosis not present

## 2023-07-06 DIAGNOSIS — M810 Age-related osteoporosis without current pathological fracture: Secondary | ICD-10-CM | POA: Diagnosis present

## 2023-07-06 DIAGNOSIS — I2510 Atherosclerotic heart disease of native coronary artery without angina pectoris: Secondary | ICD-10-CM | POA: Diagnosis not present

## 2023-07-06 DIAGNOSIS — Z79890 Hormone replacement therapy (postmenopausal): Secondary | ICD-10-CM | POA: Diagnosis not present

## 2023-07-06 DIAGNOSIS — M6281 Muscle weakness (generalized): Secondary | ICD-10-CM | POA: Diagnosis not present

## 2023-07-06 DIAGNOSIS — E039 Hypothyroidism, unspecified: Secondary | ICD-10-CM | POA: Diagnosis present

## 2023-07-06 DIAGNOSIS — G20A1 Parkinson's disease without dyskinesia, without mention of fluctuations: Secondary | ICD-10-CM | POA: Diagnosis present

## 2023-07-06 DIAGNOSIS — S72001S Fracture of unspecified part of neck of right femur, sequela: Secondary | ICD-10-CM | POA: Diagnosis not present

## 2023-07-06 DIAGNOSIS — Z881 Allergy status to other antibiotic agents status: Secondary | ICD-10-CM | POA: Diagnosis not present

## 2023-07-06 DIAGNOSIS — Z66 Do not resuscitate: Secondary | ICD-10-CM | POA: Diagnosis not present

## 2023-07-06 DIAGNOSIS — E46 Unspecified protein-calorie malnutrition: Secondary | ICD-10-CM | POA: Diagnosis not present

## 2023-07-06 DIAGNOSIS — F418 Other specified anxiety disorders: Secondary | ICD-10-CM | POA: Diagnosis not present

## 2023-07-06 DIAGNOSIS — Z515 Encounter for palliative care: Secondary | ICD-10-CM | POA: Diagnosis not present

## 2023-07-06 DIAGNOSIS — R2689 Other abnormalities of gait and mobility: Secondary | ICD-10-CM | POA: Diagnosis not present

## 2023-07-06 DIAGNOSIS — S5291XA Unspecified fracture of right forearm, initial encounter for closed fracture: Secondary | ICD-10-CM | POA: Diagnosis present

## 2023-07-06 DIAGNOSIS — K589 Irritable bowel syndrome without diarrhea: Secondary | ICD-10-CM | POA: Diagnosis present

## 2023-07-06 DIAGNOSIS — I959 Hypotension, unspecified: Secondary | ICD-10-CM | POA: Diagnosis not present

## 2023-07-06 DIAGNOSIS — Y9301 Activity, walking, marching and hiking: Secondary | ICD-10-CM | POA: Diagnosis present

## 2023-07-06 DIAGNOSIS — S42201A Unspecified fracture of upper end of right humerus, initial encounter for closed fracture: Secondary | ICD-10-CM

## 2023-07-06 DIAGNOSIS — D72829 Elevated white blood cell count, unspecified: Secondary | ICD-10-CM | POA: Diagnosis not present

## 2023-07-06 DIAGNOSIS — Z7401 Bed confinement status: Secondary | ICD-10-CM | POA: Diagnosis not present

## 2023-07-06 DIAGNOSIS — F411 Generalized anxiety disorder: Secondary | ICD-10-CM | POA: Diagnosis not present

## 2023-07-06 DIAGNOSIS — H4020X0 Unspecified primary angle-closure glaucoma, stage unspecified: Secondary | ICD-10-CM | POA: Diagnosis present

## 2023-07-06 DIAGNOSIS — R531 Weakness: Secondary | ICD-10-CM | POA: Diagnosis not present

## 2023-07-06 DIAGNOSIS — I739 Peripheral vascular disease, unspecified: Secondary | ICD-10-CM | POA: Diagnosis present

## 2023-07-06 DIAGNOSIS — R627 Adult failure to thrive: Secondary | ICD-10-CM | POA: Diagnosis not present

## 2023-07-06 DIAGNOSIS — Z886 Allergy status to analgesic agent status: Secondary | ICD-10-CM | POA: Diagnosis not present

## 2023-07-06 DIAGNOSIS — Z889 Allergy status to unspecified drugs, medicaments and biological substances status: Secondary | ICD-10-CM | POA: Diagnosis not present

## 2023-07-06 DIAGNOSIS — W19XXXA Unspecified fall, initial encounter: Secondary | ICD-10-CM | POA: Diagnosis not present

## 2023-07-06 DIAGNOSIS — W010XXA Fall on same level from slipping, tripping and stumbling without subsequent striking against object, initial encounter: Secondary | ICD-10-CM | POA: Diagnosis present

## 2023-07-06 DIAGNOSIS — S72001A Fracture of unspecified part of neck of right femur, initial encounter for closed fracture: Secondary | ICD-10-CM | POA: Diagnosis not present

## 2023-07-06 DIAGNOSIS — Z681 Body mass index (BMI) 19 or less, adult: Secondary | ICD-10-CM | POA: Diagnosis not present

## 2023-07-06 DIAGNOSIS — I10 Essential (primary) hypertension: Secondary | ICD-10-CM | POA: Diagnosis not present

## 2023-07-06 DIAGNOSIS — Z888 Allergy status to other drugs, medicaments and biological substances status: Secondary | ICD-10-CM | POA: Diagnosis not present

## 2023-07-06 DIAGNOSIS — I25119 Atherosclerotic heart disease of native coronary artery with unspecified angina pectoris: Secondary | ICD-10-CM | POA: Diagnosis not present

## 2023-07-06 DIAGNOSIS — R54 Age-related physical debility: Secondary | ICD-10-CM | POA: Diagnosis not present

## 2023-07-06 DIAGNOSIS — R1312 Dysphagia, oropharyngeal phase: Secondary | ICD-10-CM | POA: Diagnosis present

## 2023-07-06 DIAGNOSIS — R278 Other lack of coordination: Secondary | ICD-10-CM | POA: Diagnosis not present

## 2023-07-06 DIAGNOSIS — I6529 Occlusion and stenosis of unspecified carotid artery: Secondary | ICD-10-CM | POA: Diagnosis present

## 2023-07-06 DIAGNOSIS — S72001D Fracture of unspecified part of neck of right femur, subsequent encounter for closed fracture with routine healing: Secondary | ICD-10-CM | POA: Diagnosis not present

## 2023-07-06 LAB — BASIC METABOLIC PANEL
Anion gap: 11 (ref 5–15)
BUN: 19 mg/dL (ref 8–23)
CO2: 25 mmol/L (ref 22–32)
Calcium: 9.3 mg/dL (ref 8.9–10.3)
Chloride: 102 mmol/L (ref 98–111)
Creatinine, Ser: 0.58 mg/dL (ref 0.44–1.00)
GFR, Estimated: >60 mL/min (ref >60)
Glucose, Bld: 134 mg/dL — ABNORMAL HIGH (ref 70–99)
Potassium: 4.3 mmol/L (ref 3.5–5.1)
Sodium: 138 mmol/L (ref 135–145)

## 2023-07-06 MED ORDER — FLUOXETINE HCL 10 MG PO CAPS
10.0000 mg | ORAL_CAPSULE | Freq: Every day | ORAL | Status: DC
  Administered 2023-07-06 – 2023-07-10 (×2): 10 mg via ORAL
  Filled 2023-07-06 (×6): qty 1

## 2023-07-06 MED ORDER — CLINDAMYCIN PHOSPHATE 600 MG/50ML IV SOLN
600.0000 mg | INTRAVENOUS | Status: AC
  Administered 2023-07-07: 600 mg via INTRAVENOUS
  Filled 2023-07-06: qty 50

## 2023-07-06 MED ORDER — ALPRAZOLAM 0.5 MG PO TABS
0.5000 mg | ORAL_TABLET | Freq: Once | ORAL | Status: AC
  Administered 2023-07-06: 0.5 mg via ORAL
  Filled 2023-07-06: qty 1

## 2023-07-06 MED ORDER — HYDROMORPHONE HCL 1 MG/ML IJ SOLN
1.0000 mg | Freq: Once | INTRAMUSCULAR | Status: AC
  Administered 2023-07-06: 1 mg via INTRAVENOUS
  Filled 2023-07-06: qty 1

## 2023-07-06 MED ORDER — DICYCLOMINE HCL 10 MG PO CAPS: 10.0000 mg | ORAL_CAPSULE | Freq: Every evening | ORAL | Status: DC | PRN

## 2023-07-06 MED ORDER — POLYETHYLENE GLYCOL 3350 17 G PO PACK
17.0000 g | PACK | Freq: Every day | ORAL | Status: DC
  Administered 2023-07-10: 17 g via ORAL
  Filled 2023-07-06 (×2): qty 1

## 2023-07-06 MED ORDER — METHOCARBAMOL 500 MG PO TABS
500.0000 mg | ORAL_TABLET | Freq: Four times a day (QID) | ORAL | Status: DC | PRN
Start: 2023-07-06 — End: 2023-07-11

## 2023-07-06 MED ORDER — TRANEXAMIC ACID-NACL 1000-0.7 MG/100ML-% IV SOLN
1000.0000 mg | INTRAVENOUS | Status: AC
  Administered 2023-07-07: 1000 mg via INTRAVENOUS
  Filled 2023-07-06: qty 100

## 2023-07-06 MED ORDER — METHOCARBAMOL 1000 MG/10ML IJ SOLN
500.0000 mg | Freq: Four times a day (QID) | INTRAMUSCULAR | Status: DC | PRN
  Administered 2023-07-09: 500 mg via INTRAVENOUS
  Filled 2023-07-06: qty 10

## 2023-07-06 MED ORDER — ALPRAZOLAM 0.5 MG PO TABS
0.5000 mg | ORAL_TABLET | Freq: Four times a day (QID) | ORAL | Status: DC | PRN
  Administered 2023-07-06 (×2): 0.5 mg via ORAL
  Filled 2023-07-06 (×2): qty 1

## 2023-07-06 MED ORDER — HYDROMORPHONE HCL 1 MG/ML IJ SOLN
0.5000 mg | Freq: Once | INTRAMUSCULAR | Status: AC
  Administered 2023-07-06: 0.5 mg via INTRAVENOUS
  Filled 2023-07-06: qty 1

## 2023-07-06 MED ORDER — HYDROMORPHONE HCL 1 MG/ML IJ SOLN
0.5000 mg | INTRAMUSCULAR | Status: DC | PRN
  Administered 2023-07-06 (×4): 1 mg via INTRAVENOUS
  Administered 2023-07-07: 0.5 mg via INTRAVENOUS
  Administered 2023-07-07: 1 mg via INTRAVENOUS
  Administered 2023-07-07 (×2): 0.5 mg via INTRAVENOUS
  Filled 2023-07-06 (×2): qty 1
  Filled 2023-07-06 (×2): qty 0.5
  Filled 2023-07-06: qty 1
  Filled 2023-07-06: qty 0.5
  Filled 2023-07-06 (×3): qty 1

## 2023-07-06 MED ORDER — LEVOTHYROXINE SODIUM 25 MCG PO TABS
25.0000 ug | ORAL_TABLET | Freq: Every day | ORAL | Status: DC
  Administered 2023-07-07: 25 ug via ORAL
  Filled 2023-07-06 (×3): qty 1

## 2023-07-06 MED ORDER — ENSURE ENLIVE PO LIQD
237.0000 mL | Freq: Two times a day (BID) | ORAL | Status: DC
  Administered 2023-07-10: 237 mL via ORAL

## 2023-07-06 MED ORDER — RISAQUAD PO CAPS
1.0000 | ORAL_CAPSULE | ORAL | Status: DC
  Administered 2023-07-09: 1 via ORAL
  Filled 2023-07-06 (×2): qty 1

## 2023-07-06 NOTE — ED Notes (Signed)
Pt. O2 sats have been dropping into the high 80's while on 2l/min of O2. O2 bumped up to 3.5l/min. Pt. Has also been coughing when trying to sip from a straw. Several times she held the water in her mouth then spit it into an emesis bag. Pt. Has remained alert and oriented throughout her ED stay.Sling was placed on pt.'s right arm per this writer and Alex Rohr,RN.

## 2023-07-06 NOTE — ED Notes (Signed)
Writer messaged attending provider Opyd, MD notifying that patient coughed after taking small sips of water and that patient was initiated on 2L [place] earlier in shift after dilaudid administration and has since needed to be bumped up to 3.5L [place].

## 2023-07-06 NOTE — TOC CM/SW Note (Signed)
Transition of Care [facility]) - Inpatient Brief Assessment   Patient Details  Name: Tracy Malone MRN: [id number] Date of Birth: December 23, 1931  Transition [facility] Care [facility] CM/SW Contact:    Mearl Latin, LCSW Phone Number: 07/06/2023, 9:38 AM   Clinical Narrative: Patient was admitted from home with spouse and is active with [facility]. TOC will continue to follow.    Transition of Care Asessment: Insurance and Status: Insurance coverage has been reviewed Patient has primary care physician: Yes Home environment has been reviewed: From home Prior level of function:: Mod Indep Prior/Current Home Services: Current home services Social Drivers of Health Review: SDOH reviewed no interventions necessary Readmission risk has been reviewed: Yes Transition of care needs: transition of care needs identified, TOC will continue to follow

## 2023-07-06 NOTE — ED Notes (Signed)
Spoke w/ pt's daughter [name] on the phone to let her know pt is being loaded up w/ [facility] at this time

## 2023-07-06 NOTE — Progress Notes (Signed)
[name] Patient Visit  Ms. Saja Balaguer is a current hospice patient, with hospice diagnosis of Parkinson's unspecified and protein calorie malnutrition. Shee was admitted 07/05/23 with diagnosis of Right hip and Right humeral fracture. [facility] was notified by husband that patient had fallen and Hospice nurse was with patient when EMS arrived. Per Dr. Patric Dykes, hospice physician, this is a related hospital admission.   Visited with patient and daughter in hospital. She is sleeping in no distress at the time of my visit. Discussed with [name], her daughter at length idea of surgical stabilization vs. not pursuing surgery. At this time her daughter feels that if the orthopedic provider thinks surgery can be done safely, she would like to have surgical stabilization.    Patient is GIP appropriate due to need for IV pain medications and surgical stabilization of fracture.  Vital Signs: 98.1/96/18   106/70    95% on RA  I&O: not documented  Abnormal labs: WBC 19.3, HBG 11.6  Diagnostics: XRAY to Right Forearm/ Right Femur/ Right Hand/ CT head/ CT cervical spine: without significant finding.   Xray Right Humerus: proximal Right humeral fracture involving the greater tuberosity and possibly the humeral neck.  Xray Pelvis: concern for impacted right femoral neck fracture  Xray Right shoulder: Right humeral neck head fracture  CT Right Hip: impacted subcapital femoral neck fracture on right  IV/PRN Meds: Dilaudid 0.5 - 1 mg IV x 6, Xanax 0.5mg  po x 1  Assessment and Plan: Principal Problem:   Closed right hip fracture, initial encounter (HCC) Active Problems:   Closed fracture of right proximal forearm   Femoral neck fracture Apparently mechanical fall resulting in hip fracture Orthopedics consulted Family is deciding about whether to proceed with surgery SCDs overnight, start Lovenox post-operatively (or as per ortho) Pain  control is complicated given her 80(!) documented allergies; will treat with Robaxin, Dilaudid prn If not proceeding with surgery, likely to benefit from transition to comfort only Hospice also consulted to assist   Pre-operative stratification Orthopedic/spinal surgery is associated with an intermediate (1-5%) cardiovascular risk for cardiac death and nonfatal MI Her Detsky's Modified Cardiac Risk Index score is Class I, with a low cardiac risk It is reasonable for her to go to the OR without additional evaluation   Humerus fracture Non-operative Will need sling and pain control This will complicate use of a walker regarding hip fracture      Anxiety Continue alprazolam, fluoxetine   Discharge Planning: Ongoing  Family Contact: spoke with daughter at bedside  IDT: Updated  Goals of Care: DNR  Glenna Fellows BSN, RN, [facility] hospital liaison [phone]

## 2023-07-06 NOTE — ED Notes (Signed)
-  [facility]ed [facility] at 407am for transportation to [facility] 5W.

## 2023-07-06 NOTE — Consult Note (Signed)
Orthopedic Surgery H&P Note  Assessment: Patient is a 89 y.o. female with right proximal humerus fracture, right valgus impacted femoral neck fracture   Plan: -Planning for operative fixation of her right femoral neck fracture -No operative plans for the right proximal humerus fracture -Diet: NPO at midnight -Will plan for ASA 81mg  post-op for dvt ppx -Antibiotics: clindamycin on call to OR -TXA on call to OR -Weight bearing status: NWB RUE in sling, NWB RLE -PT/OT evaluate and treat post-operatively -Pain control -Dispo: pending completion of operative plans   Had two discussions with the patients daughter, [name], about her mom's fractures. Her mom is mobile and ambulates but she has dementia and is in hospice care for failure to thrive. I went over the recommendation for surgical management of her hip to help her mobilize and give her the best shot at continuing to ambulate. Discussed fixation of the hip fracture with cannulated screws as the surgical treatment. I told her that with her age and co-morbidities, I would treat the proximal humerus fracture non-operatively. [name] thought about things and talked with her mom after which I called again. They elected to proceed with surgery. I went over the risks of surgery including but not limited to: death, intraarticular screw placement, screw loosening, hardware failure, peri-implant fracture, need for additional procedures, stiffness, persistent pain, dvt/pe, nonunion, and malunion. The benefits and alternatives were also covered. After this discussion, [name] again elected to proceed with surgical management on behalf of her mom.    ___________________________________________________________________________   Reason for consult: right proximal humerus fracture and right femoral neck fracture  History:  Patient is a 89 y.o. female who is in home hospice and had a fall that resulted in right shoulder and right hip pain. She presented to the  [facility]. Work up revealed a proximal humerus fracture and a right femoral neck fracture. She was transferred to [facility] ER and was admitted to a medicine service. She was seen on the floor this evening. She is reporting right arm and right hip pain. No pain elsewhere.    Past Medical History:  Diagnosis Date   Acute lower UTI 11/22/2018   Bell's palsy 1966   Hx: right side facial droop, resolved per patient 04/02/19   Carotid artery disease (HCC) 2010   on vascular screening;unchanged 2013.(could not tolerate simvastatin, no other statins tried)--<30% blockage bilat 07/2011   Claustrophobia    Depression    treated in the past for years;stopped in 2010 for a years   Duodenal ulcer 1962   h/o   Dysrhythmia    ocassional PVC's, no current problems per patient on 04/02/19   Fibromyalgia    chronic fatigue, chronic abdominal pain   Frequent PVCs 07/2012   Seen by [facility]: benign, asymptomatic, normal EF   Gallstones 02/2021   on [place]   GERD (gastroesophageal reflux disease)    diet controlled   Glaucoma, narrow-angle    s/p laser surgery   History of hiatal hernia    during endoscopy   Hypothyroid 03/2007   IBS (irritable bowel syndrome)    Dr. Elnoria Howard   Ischemic colitis (HCC) 11/21/2018   no current problems per patient on 04/02/19   Lichenoid keratosis 12/03/2020   Dr.Stinehelfer   Ocular migraine    Osteoporosis 04/2010   Dr.Hawkes; later consulted Dr. Sharl Ma (2022)   Panic attack    Parkinson's disease (HCC) 06/23/2016   Recurrent UTI    has cystocele-Dr.Grewal   Shingles 1999   h/o  Superficial thrombophlebitis 03/2009   RLE   Trochanteric bursitis 12/2008   bilateral   Allergies  Allergen Reactions   Iodinated Contrast Media Anaphylaxis   Iodine Anaphylaxis    IV and topical forms. Unknown   Levsin [Hyoscyamine Sulfate]     Vision problems/pt has glaucoma   Salmon [Fish Allergy] Hives and Shortness Of Breath   Shellfish Allergy Anaphylaxis   Tramadol  Swelling   Remeron [Mirtazapine] Other (See Comments)    Cause blurred vision and red eyes, pt has glaucoma   Aspirin Other (See Comments)    Sever stomach pain due to ulcer scaring.   Bis Subcit-Metronid-Tetracyc Swelling    Tongue swelling. Face tingling    Cephalexin Hives   Ciprofloxacin Diarrhea   Codeine Nausea And Vomiting   Cyclobenzaprine Other (See Comments)    Tingly/prickly sensation.    Darvocet [Propoxyphene N-Acetaminophen] Nausea And Vomiting   Demerol [Meperidine] Nausea Only   Dexlansoprazole Swelling    Redness, swelling and peeling of both feet, foot pain   Diphedryl [Diphenhydramine] Other (See Comments)    Increased pulse/small amount ok   Doxycycline Hyclate Other (See Comments)    GI intolerance.   Doxycycline Hyclate      GI intolerance   Epinephrine Other (See Comments)    Breathing problems/fainting   Erythromycin Other (See Comments)    GI intolerance.    Fish Oil     breathing problems/hives   Hyoscyamine     eye pain   Latex Other (See Comments)    Gloves ok.  Skin gets red from elastic in underwear and latex bandaides.   Meperidine Hcl Nausea And Vomiting   Nitrofurantoin Diarrhea   Nsaids Other (See Comments)   Other     migraines: increased pulse, faint, diarrhea   Prednisone Other (See Comments)    Headache Other reaction(s): headache   Ra Diphedryl Allergy [Diphenhydramine Hcl]     Other reaction(s): increased pulse small dose okay   Sertraline Hcl Swelling and Other (See Comments)    Migraine Swelling of tongue/lip (09/2012) Other reaction(s): Unknown   Shellfish-Derived Products     Other reaction(s): Unknown   Sulfa Antibiotics Other (See Comments)    Increased pulse, fainting, diarrhea, thrush   Wellbutrin [Bupropion] Other (See Comments)    Headaches   Xylocaine [Lidocaine Hcl]     With epinephrine, given by dentist.  Speeded up heart rate and she passed out (occured twice, at dentist)   Xylocaine [Lidocaine]     Unknown    Biaxin [Clarithromycin] Rash    Started after completing 10 day course of 2000 mg /day, Lips swelling   Ibuprofen Other (See Comments)    Motrin ok with a GI effect. rash Motrin okay with a GI effect   Penicillins Hives and Rash    Past Surgical History:  Procedure Laterality Date   ABDOMINAL HYSTERECTOMY     BACK SURGERY  05/2019   CATARACT EXTRACTION, BILATERAL  1995, 1996   EYE SURGERY Bilateral    laser - glaucoma   Flexible sigmoidoscopy     FRACTURE SURGERY  2020, Sept.  12th (!)   KYPHOPLASTY N/A 04/03/2019   Procedure: KYPHOPLASTY L1;  Surgeon: Venita Lick, MD;  Location: [facility] OR;  Service: Orthopedics;  Laterality: N/A;  90 mins   RADIOFREQUENCY ABLATION Bilateral 11/14/2022   L3-4, L4-5 bilateral ([facility])   THYROIDECTOMY, PARTIAL  09/2005   L nodule; Dr. Gerrit Friends   TONSILLECTOMY  1946   UPPER GI ENDOSCOPY  06/27/2012  VAGINAL HYSTERECTOMY  1971   and bladder repair.  Still has ovaries   WISDOM TOOTH EXTRACTION      Social History   Tobacco Use   Smoking status: Never   Smokeless tobacco: Never  Substance Use Topics   Alcohol use: No    Alcohol/week: 0.0 standard drinks of alcohol     Family history: -reviewed and not pertinent to proximal humerus and femoral neck fracture   Physical Exam:  BMI of 18.3  General: no acute distress, appears stated age Neurologic: alert, answering questions appropriately, following commands Cardiovascular: regular rate, no cyanosis Respiratory: unlabored breathing on room air, symmetric chest rise Psychiatric: appropriate affect, normal cadence to speech  MSK:   -Right upper extremities  TTP around the proximal humerus, no other tenderness to palpation, swelling around the proximal humerus, no open wounds seen  AIN/PIN/IO intact  Palpable radial pulse  Sensation intact to light touch in median/ulnar/radial/axillary nerve distributions  Hand warm and well perfused  -Right lower extremity  No  tenderness to palpation over extremity over the hip, pain with log roll, no gross deformity, no open wounds EHL/TA/GSC intact Plantarflexes and dorsiflexes toes Sensation intact to light touch in sural, saphenous, tibial, deep peroneal, and superficial peroneal nerve distributions Foot warm and well perfused  Imaging: XR of the pelvis 07/05/2023 was independently reviewed and interpreted, showing a valgus impacted femoral neck fracture on the right. No other fractures seen. No dislocation noted.   XRs of the right humerus from 07/05/2023 were independently reviewed and interpreted, showing a lucency in the neck of the humerus consisted with suspected fracture. There is a large greater tuberosity fracture seen. No other fractures seen. No dislocation noted.    Patient name: Tracy Malone Patient MRN: [id number] Date: 07/06/23

## 2023-07-06 NOTE — Progress Notes (Signed)
[name] liaison note     This patient is a current hospice patient with [facility].    Liaison will continue to follow for any discharge planning needs and to coordinate continuation of hospice care.    Please don't hesitate to call with any Hospice related questions or concerns.    Thank you for the opportunity to participate in this patient's care.  Glenna Fellows, BSN, RN, OCN [employer] [phone] or [phone]

## 2023-07-06 NOTE — H&P (Signed)
History and Physical    Patient: Tracy Malone GMW:[id number] DOB: 04-Aug-1931 DOA: 07/05/2023 DOS: the patient was seen and examined on 07/06/2023 PCP: Joselyn Arrow, MD  Patient coming from: Home - lives with  husband (also dealing with health problems); NOK: Daughter, Lonna Cobb, [phone]   Chief Complaint: fall  HPI: Tracy Malone is a 89 y.o. female with medical history significant of carotid stenosis, fibromyalgia, hypothyroidism, and Parkinson's who presented on 12/12 with a fall.  She has a R humerus and R hip fracture.  Patient has been on home hospice for FTT.  She got up today and had a mechanical fall, landing on R side and injuring both her R arm and leg.  Her primary concerns currently are pain and anxiety.  She is uncertain about whether she will proceed with surgery.  Daughter is also uncertain and wants to think about the decision more.  Hospice also notified of admission.    ER Course:  R hip and humerus fractures from fall.  Orthopedics to consult.     Review of Systems: As mentioned in the history of present illness. All other systems reviewed and are negative. Past Medical History:  Diagnosis Date   Acute lower UTI 11/22/2018   Allergy    Bell's palsy 1966   Hx: right side facial droop, resolved per patient 04/02/19   Carotid artery disease (HCC) 2010   on vascular screening;unchanged 2013.(could not tolerate simvastatin, no other statins tried)--<30% blockage bilat 07/2011   Chronic abdominal pain    Chronic fatigue and malaise    Claustrophobia    Cyst of left ovary    last imaging 06/2021, benign, no further f/u   Depression    treated in the past for years;stopped in 2010 for a years   Duodenal ulcer 1962   h/o   Dysrhythmia    ocassional PVC's, no current problems per patient on 04/02/19   Fibromyalgia    Frequent PVCs 07/2012   Seen by [facility]: benign, asymptomatic, normal EF   Gallstones 02/2021   on [place]   GERD (gastroesophageal reflux  disease)    diet controlled   Glaucoma, narrow-angle    s/p laser surgery   History of hiatal hernia    during endoscopy   Hypothyroid 03/2007   IBS (irritable bowel syndrome)    Dr. Elnoria Howard   Ischemic colitis (HCC) 11/21/2018   no current problems per patient on 04/02/19   Lichenoid keratosis 12/03/2020   Dr.Stinehelfer   Nausea and vomiting 10/29/2019   Ocular migraine    Osteoporosis 04/2010   Dr.Hawkes; later consulted Dr. Sharl Ma (2022)   Panic attack    Parkinson disease (HCC)    Parkinson's disease (HCC) 06/23/2016   Recurrent UTI    has cystocele-Dr.Grewal   Shingles 1999   h/o   Superficial thrombophlebitis 03/2009   RLE   Trochanteric bursitis 12/2008   bilateral   Past Surgical History:  Procedure Laterality Date   ABDOMINAL HYSTERECTOMY     BACK SURGERY  05/2019   CATARACT EXTRACTION, BILATERAL  1995, 1996   EYE SURGERY Bilateral    laser - glaucoma   Flexible sigmoidoscopy     FRACTURE SURGERY  2020, Sept.  12th (!)   KYPHOPLASTY N/A 04/03/2019   Procedure: KYPHOPLASTY L1;  Surgeon: Venita Lick, MD;  Location: [facility] OR;  Service: Orthopedics;  Laterality: N/A;  90 mins   RADIOFREQUENCY ABLATION Bilateral 11/14/2022   L3-4, L4-5 bilateral ([facility])   THYROIDECTOMY, PARTIAL  09/2005   L nodule; Dr. Gerrit Friends   TONSILLECTOMY  1946   UPPER GI ENDOSCOPY  06/27/2012   VAGINAL HYSTERECTOMY  1971   and bladder repair.  Still has ovaries   WISDOM TOOTH EXTRACTION     Social History:  reports that she has never smoked. She has never used smokeless tobacco. She reports that she does not drink alcohol and does not use drugs.  Allergies  Allergen Reactions   Iodinated Contrast Media Anaphylaxis   Iodine Anaphylaxis    IV and topical forms. Unknown   Levsin [Hyoscyamine Sulfate]     Vision problems/pt has glaucoma   [place] [Fish Allergy] Hives and Shortness Of Breath   Shellfish Allergy Anaphylaxis   Tramadol Swelling   Remeron [Mirtazapine] Other (See  Comments)    Cause blurred vision and red eyes, pt has glaucoma   Aspirin Other (See Comments)    Sever stomach pain due to ulcer scaring.   Bis Subcit-Metronid-Tetracyc Swelling    Tongue swelling. Face tingling    Cephalexin Hives   Ciprofloxacin Diarrhea   Codeine Nausea And Vomiting   Cyclobenzaprine Other (See Comments)    Tingly/prickly sensation.    Darvocet [Propoxyphene N-Acetaminophen] Nausea And Vomiting   Demerol [Meperidine] Nausea Only   Dexlansoprazole Swelling    Redness, swelling and peeling of both feet, foot pain   Diphedryl [Diphenhydramine] Other (See Comments)    Increased pulse/small amount ok   Doxycycline Hyclate Other (See Comments)    GI intolerance.   Doxycycline Hyclate      GI intolerance   Epinephrine Other (See Comments)    Breathing problems/fainting   Erythromycin Other (See Comments)    GI intolerance.    Fish Oil     breathing problems/hives   Hyoscyamine     eye pain   Latex Other (See Comments)    Gloves ok.  Skin gets red from elastic in underwear and latex bandaides.   Meperidine Hcl Nausea And Vomiting   Nitrofurantoin Diarrhea   Nsaids Other (See Comments)   Other     migraines: increased pulse, faint, diarrhea   Prednisone Other (See Comments)    Headache Other reaction(s): headache   Ra Diphedryl Allergy [Diphenhydramine Hcl]     Other reaction(s): increased pulse small dose okay   Sertraline Hcl Swelling and Other (See Comments)    Migraine Swelling of tongue/lip (09/2012) Other reaction(s): Unknown   Shellfish-Derived Products     Other reaction(s): Unknown   Sulfa Antibiotics Other (See Comments)    Increased pulse, fainting, diarrhea, thrush   Wellbutrin [Bupropion] Other (See Comments)    Headaches   Xylocaine [Lidocaine Hcl]     With epinephrine, given by dentist.  Speeded up heart rate and she passed out (occured twice, at dentist)   Xylocaine [Lidocaine]     Unknown   Biaxin [Clarithromycin] Rash    Started  after completing 10 day course of 2000 mg /day, Lips swelling   Ibuprofen Other (See Comments)    Motrin ok with a GI effect. rash Motrin okay with a GI effect   Penicillins Hives and Rash    Family History  Problem Relation Age of Onset   Heart disease Mother    Hypertension Mother    Hypertension Sister    Arthritis Sister    Heart disease Brother    Lung cancer Brother        lung   Diabetes Maternal Grandfather    HIV Son    Diabetes Granddaughter  type 1   Colon cancer Neg Hx    Esophageal cancer Neg Hx    Prostate cancer Neg Hx    Stomach cancer Neg Hx    Rectal cancer Neg Hx     Prior to Admission medications   Medication Sig Start Date End Date Taking? Authorizing Provider  ALPRAZolam Prudy Feeler) 0.5 MG tablet TAKE FOUR TABLETS BY MOUTH DAILY AS NEEDED FOR ANXIETY 05/22/23   Mozingo, Thereasa Solo, NP  b complex vitamins capsule Take 1 capsule by mouth daily. Patient not taking: Reported on 06/20/2023    [provider]  Carbidopa-Levodopa ER (SINEMET CR) 25-100 MG tablet controlled release TAKE ONE TABLET BY MOUTH THREE TIMES A DAY 06/28/23   Sater, Pearletha Furl, MD  Cholecalciferol (VITAMIN D-3 PO) Take 1 capsule by mouth every other day. Patient not taking: Reported on 06/20/2023    [provider]  dicyclomine (BENTYL) 10 MG capsule Take 1 capsule (10 mg total) by mouth every 6 (six) hours as needed for spasms. 05/29/23   Cirigliano, Vito V, DO  FLUoxetine (PROZAC) 10 MG capsule Take 1 capsule (10 mg total) by mouth daily. Patient not taking: Reported on 06/20/2023 05/10/23   Mozingo, Thereasa Solo, NP  polyethylene glycol (MIRALAX) 17 g packet Take 17 g by mouth daily as needed. 04/20/22   Gareth Eagle, PA-C  Probiotic Product (PROBIOTIC PO) Take 1 capsule by mouth daily. Patient not taking: Reported on 06/20/2023    [provider]  SYNTHROID 25 MCG tablet TAKE 1 TABLET DAILY MONDAY THROUGH [facility], SKIP SUNDAY FOR HYPOTHYROIDISM  03/15/23   Joselyn Arrow, MD    Physical Exam: Vitals:   07/06/23 0300 07/06/23 0315 07/06/23 0430 07/06/23 0756  BP: (!) 98/49  106/70   Pulse: 92 94 96   Resp:   18   Temp:    98.1 F (36.7 C)  TempSrc:    Axillary  SpO2: 97% 95% 95%    General:  Appears calm and comfortable and is in NAD Eyes:   EOMI, normal lids, iris ENT:  grossly normal hearing, lips & tongue, mmm Neck:  no LAD, masses or thyromegaly Cardiovascular:  RRR, no m/r/g. No LE edema.  Respiratory:   CTA bilaterally with no wheezes/rales/rhonchi.  Normal respiratory effort. On [place] O2. Abdomen:  soft, NT, ND Skin:  no rash or induration seen on limited exam Musculoskeletal:  RUE in sling, RLE pain with movement but no frank deformity Lower extremity:  No LE edema.  Limited foot exam with no ulcerations.  2+ distal pulses. Psychiatric:  blunted mood and affect, speech fluent and appropriate, AOx3 Neurologic:  CN 2-12 grossly intact   Radiological Exams on Admission: Independently reviewed - see discussion in A/P where applicable  CT Hip Right Wo Contrast Result Date: 07/05/2023 CLINICAL DATA:  Recent fall with leg pain, initial encounter EXAM: CT OF THE RIGHT HIP WITHOUT CONTRAST TECHNIQUE: Multidetector CT imaging of the right hip was performed according to the standard protocol. Multiplanar CT image reconstructions were also generated. RADIATION DOSE REDUCTION: This exam was performed according to the departmental dose-optimization program which includes automated exposure control, adjustment of the mA and/or kV according to patient size and/or use of iterative reconstruction technique. COMPARISON:  Films from earlier in the same day. FINDINGS: Bones/Joint/Cartilage There is an right impacted subcapital femoral neck fracture identified. No dislocation is seen. No intratrochanteric component is noted. No other fractures are seen. No joint effusion is noted. Ligaments Suboptimally assessed by CT. Muscles and Tendons  Surrounding musculature appears within normal limits. Soft tissues No soft tissue abnormality is noted. The visualized pelvic structures are within limits. IMPRESSION: Impacted subcapital femoral neck fracture on the right. Electronically Signed   By: Alcide Clever M.D.   On: 07/05/2023 23:59   DG HIP PORT UNILAT WITH PELVIS 1V RIGHT Result Date: 07/05/2023 CLINICAL DATA:  Fall, pain EXAM: DG HIP (WITH OR WITHOUT PELVIS) 1V PORT RIGHT COMPARISON:  None Available. FINDINGS: No definite femoral neck fracture seen on this single view. AP view on the pelvis series is concerning for impacted femoral neck fracture. No subluxation or dislocation. IMPRESSION: No definite fracture seen on this single view. Consider further evaluation with CT given the appearance on pelvis series. Electronically Signed   By: Charlett Nose M.D.   On: 07/05/2023 23:29   DG Shoulder Right Result Date: 07/05/2023 CLINICAL DATA:  Right shoulder pain, fall EXAM: RIGHT SHOULDER - 2+ VIEW COMPARISON:  None Available. FINDINGS: Proximal right humeral fracture involving the right humeral neck and greater tuberosity region. No subluxation or dislocation. IMPRESSION: Right humeral neck/head fracture. Electronically Signed   By: Charlett Nose M.D.   On: 07/05/2023 23:27   CT Cervical Spine Wo Contrast Result Date: 07/05/2023 CLINICAL DATA:  Neck trauma (Age >= 65y).  Fall. EXAM: CT CERVICAL SPINE WITHOUT CONTRAST TECHNIQUE: Multidetector CT imaging of the cervical spine was performed without intravenous contrast. Multiplanar CT image reconstructions were also generated. RADIATION DOSE REDUCTION: This exam was performed according to the departmental dose-optimization program which includes automated exposure control, adjustment of the mA and/or kV according to patient size and/or use of iterative reconstruction technique. COMPARISON:  None available FINDINGS: Alignment: Normal Skull base and vertebrae: No acute fracture. No primary bone lesion or  focal pathologic process. Soft tissues and spinal canal: No prevertebral fluid or swelling. No visible canal hematoma. Disc levels: Mild diffuse degenerative disc disease and facet disease. Upper chest: No acute findings Other: None IMPRESSION: No acute bony abnormality. Electronically Signed   By: Charlett Nose M.D.   On: 07/05/2023 23:23   CT Head Wo Contrast Result Date: 07/05/2023 CLINICAL DATA:  Head trauma, minor (Age >= 65y).  Fall. EXAM: CT HEAD WITHOUT CONTRAST TECHNIQUE: Contiguous axial images were obtained from the base of the skull through the vertex without intravenous contrast. RADIATION DOSE REDUCTION: This exam was performed according to the departmental dose-optimization program which includes automated exposure control, adjustment of the mA and/or kV according to patient size and/or use of iterative reconstruction technique. COMPARISON:  None Available. FINDINGS: Brain: There is atrophy and chronic small vessel disease changes. No acute intracranial abnormality. Specifically, no hemorrhage, hydrocephalus, mass lesion, acute infarction, or significant intracranial injury. Vascular: No hyperdense vessel or unexpected calcification. Skull: No acute calvarial abnormality. Sinuses/Orbits: No acute findings Other: None IMPRESSION: Atrophy, chronic microvascular disease. No acute intracranial abnormality. Electronically Signed   By: Charlett Nose M.D.   On: 07/05/2023 23:21   DG Pelvis Portable Result Date: 07/05/2023 CLINICAL DATA:  Fall EXAM: PORTABLE PELVIS 1-2 VIEWS COMPARISON:  None Available. FINDINGS: There is foreshortening of the right femoral neck concerning for impacted fracture. No subluxation or dislocation. IMPRESSION: Concern for impacted right femoral neck fracture. Electronically Signed   By: Charlett Nose M.D.   On: 07/05/2023 23:09   DG Hand Complete Right Result Date: 07/05/2023 CLINICAL DATA:  Fall, right side pain EXAM: RIGHT HAND - COMPLETE 3+ VIEW COMPARISON:  None  Available. 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 Diffuse osteopenia. No  acute bony abnormality. Specifically, no fracture, subluxation, or dislocation. Soft tissues are intact. IMPRESSION: No acute bony abnormality. Electronically Signed   By: Charlett Nose M.D.   On: 07/05/2023 23:04   DG Forearm Right Result Date: 07/05/2023 CLINICAL DATA:  Fall, right side pain EXAM: RIGHT FOREARM - 2 VIEW COMPARISON:  None Available. FINDINGS: There is no evidence of fracture or other focal bone lesions. Soft tissues are unremarkable. IMPRESSION: Negative. Electronically Signed   By: Charlett Nose M.D.   On: 07/05/2023 23:04   DG Humerus Right Result Date: 07/05/2023 CLINICAL DATA:  Fall, right side pain EXAM: RIGHT HUMERUS - 2+ VIEW COMPARISON:  None Available. FINDINGS: There is a proximal right humeral fracture involving the greater tuberosity region of the humeral head. Extension across the humeral neck suspected but difficult to visualize. No subluxation or dislocation. IMPRESSION: Proximal right humeral fracture involving the greater tuberosity and possibly humeral neck. Electronically Signed   By: Charlett Nose M.D.   On: 07/05/2023 23:03   DG Femur 1 View Right Result Date: 07/05/2023 CLINICAL DATA:  Fall, pain EXAM: RIGHT FEMUR 1 VIEW COMPARISON:  None Available. FINDINGS: Limited single view of the right femur. No visible fracture, subluxation or dislocation. IMPRESSION: Limited study.  No visible acute bony abnormality. Electronically Signed   By: Charlett Nose M.D.   On: 07/05/2023 23:03   DG Chest Portable 1 View Result Date: 07/05/2023 CLINICAL DATA:  Fall EXAM: PORTABLE CHEST 1 VIEW COMPARISON:  01/30/2022 FINDINGS: Heart and mediastinal contours are within normal limits. No focal opacities or effusions. Proximal right humeral fracture in the region of the humeral neck/head. See humerus series report. IMPRESSION: No acute cardiopulmonary disease. Proximal right humeral fracture.  Electronically Signed   By: Charlett Nose M.D.   On: 07/05/2023 23:02    EKG: none   Labs on Admission: I have personally reviewed the available labs and imaging studies at the time of the admission.  Pertinent labs:     Assessment and Plan: Principal Problem:   Closed right hip fracture, initial encounter [facility]) Active Problems:   Closed fracture of right proximal forearm    Femoral neck fracture Apparently mechanical fall resulting in hip fracture Orthopedics consulted Family is deciding about whether to proceed with surgery SCDs overnight, start Lovenox post-operatively (or as per ortho) Pain control is complicated given her 7(!) documented allergies; will treat with Robaxin, Dilaudid prn If not proceeding with surgery, likely to benefit from transition to comfort only Hospice also consulted to assist  Pre-operative stratification Orthopedic/spinal surgery is associated with an intermediate (1-5%) cardiovascular risk for cardiac death and nonfatal MI Her Detsky's Modified Cardiac Risk Index score is Class I, with a low cardiac risk It is reasonable for her to go to the OR without additional evaluation  Humerus fracture Non-operative Will need sling and pain control This will complicate use of a walker regarding hip fracture  Parkinson's Continue Sinemet  Carotid stenosis No longer appears to be following with vascular  Fibromyalgia/IBS She "hurts all the time" Continue Bentyl prn  Hypothyroidism Continue Synthroid  Anxiety Continue alprazolam, fluoxetine  DNR I have discussed code status with the patient and her family and they are in agreement that the patient would not desire resuscitation and would prefer to die a natural death should that situation arise.     Advance Care Planning:   Code Status: Do not attempt resuscitation (DNR) - Comfort care   Consults: Orthopedics; hospice; nutrition; may need PT/OT  DVT Prophylaxis:  SCDs  Family  Communication: Her son in law was at the bedside and I spoke with her daughter by telephone  Severity of Illness: The appropriate patient status for this patient is INPATIENT. Inpatient status is judged to be reasonable and necessary in order to provide the required intensity of service to ensure the patient's safety. The patient's presenting symptoms, physical exam findings, and initial radiographic and laboratory data in the context of their chronic comorbidities is felt to place them at high risk for further clinical deterioration. Furthermore, it is not anticipated that the patient will be medically stable for discharge from the hospital within 2 midnights of admission.   * I certify that at the point of admission it is my clinical judgment that the patient will require inpatient hospital care spanning beyond 2 midnights from the point of admission due to high intensity of service, high risk for further deterioration and high frequency of surveillance required.*  Author: Jonah Blue, MD 07/06/2023 10:17 AM  For on call review [email or web address].

## 2023-07-06 NOTE — ED Notes (Signed)
Pt placed on 2L [place]

## 2023-07-06 NOTE — ED Notes (Signed)
-  [facility] called for report at 602am, spoke to RN and EDP regarding patient not being splinted. Unknown of outcome of call aside from EDP stating the patient did not need a splint. EDP ended call and RN never finishing giving report to [facility].  -Called [facility] at 642am to see where the truck was for transportation due to no one arriving yet. [facility] advised the call was handed off the dayshift, dispatcher said she did not know the reason the call was handed off the day shift when asked.  -No one informed [facility] staff that [facility] was no longer coming to get patient nor a reason why.

## 2023-07-07 ENCOUNTER — Encounter (HOSPITAL_COMMUNITY): Payer: Self-pay | Admitting: Family Medicine

## 2023-07-07 ENCOUNTER — Inpatient Hospital Stay (HOSPITAL_COMMUNITY): Payer: Medicare Other | Admitting: Anesthesiology

## 2023-07-07 ENCOUNTER — Encounter (HOSPITAL_COMMUNITY)
Admission: EM | Disposition: A | Discharge status: Skilled Nursing Facility | Payer: Self-pay | Source: Home / Self Care | Attending: Internal Medicine

## 2023-07-07 ENCOUNTER — Inpatient Hospital Stay (HOSPITAL_COMMUNITY): Payer: Medicare Other

## 2023-07-07 ENCOUNTER — Other Ambulatory Visit: Payer: Self-pay

## 2023-07-07 DIAGNOSIS — S42201A Unspecified fracture of upper end of right humerus, initial encounter for closed fracture: Secondary | ICD-10-CM | POA: Diagnosis not present

## 2023-07-07 DIAGNOSIS — S5291XA Unspecified fracture of right forearm, initial encounter for closed fracture: Secondary | ICD-10-CM | POA: Diagnosis not present

## 2023-07-07 DIAGNOSIS — S72044A Nondisplaced fracture of base of neck of right femur, initial encounter for closed fracture: Secondary | ICD-10-CM | POA: Diagnosis not present

## 2023-07-07 DIAGNOSIS — S72001S Fracture of unspecified part of neck of right femur, sequela: Secondary | ICD-10-CM | POA: Diagnosis not present

## 2023-07-07 DIAGNOSIS — S72001A Fracture of unspecified part of neck of right femur, initial encounter for closed fracture: Secondary | ICD-10-CM

## 2023-07-07 HISTORY — PX: HIP PINNING,CANNULATED: SHX1758

## 2023-07-07 LAB — BASIC METABOLIC PANEL WITH GFR
Anion gap: 15 (ref 5–15)
BUN: 19 mg/dL (ref 8–23)
CO2: 21 mmol/L — ABNORMAL LOW (ref 22–32)
Calcium: 9.5 mg/dL (ref 8.9–10.3)
Chloride: 105 mmol/L (ref 98–111)
Creatinine, Ser: 0.88 mg/dL (ref 0.44–1.00)
GFR, Estimated: >60 mL/min (ref >60)
Glucose, Bld: 79 mg/dL (ref 70–99)
Potassium: 5 mmol/L (ref 3.5–5.1)
Sodium: 141 mmol/L (ref 135–145)

## 2023-07-07 LAB — CBC
HCT: 38.8 % (ref 36.0–46.0)
Hemoglobin: 12.4 g/dL (ref 12.0–15.0)
MCH: 30.7 pg (ref 26.0–34.0)
MCHC: 32 g/dL (ref 30.0–36.0)
MCV: 96 fL (ref 80.0–100.0)
Platelets: 200 10*3/uL (ref 150–400)
RBC: 4.04 MIL/uL (ref 3.87–5.11)
RDW: 13.2 % (ref 11.5–15.5)
WBC: 25.6 10*3/uL — ABNORMAL HIGH (ref 4.0–10.5)
nRBC: 0 % (ref 0.0–0.2)

## 2023-07-07 LAB — TYPE AND SCREEN
ABO/RH(D): B POS
Antibody Screen: NEGATIVE

## 2023-07-07 LAB — GLUCOSE, CAPILLARY: Glucose-Capillary: 94 mg/dL (ref 70–99)

## 2023-07-07 SURGERY — FIXATION, FEMUR, NECK, PERCUTANEOUS, USING SCREW
Anesthesia: Monitor Anesthesia Care | Site: Hip | Laterality: Right

## 2023-07-07 MED ORDER — PHENYLEPHRINE 80 MCG/ML (10ML) SYRINGE FOR IV PUSH (FOR BLOOD PRESSURE SUPPORT)
PREFILLED_SYRINGE | INTRAVENOUS | Status: DC | PRN
  Administered 2023-07-07: 80 ug via INTRAVENOUS
  Administered 2023-07-07: 120 ug via INTRAVENOUS
  Administered 2023-07-07 (×6): 80 ug via INTRAVENOUS
  Administered 2023-07-07: 120 ug via INTRAVENOUS

## 2023-07-07 MED ORDER — LORAZEPAM 2 MG/ML IJ SOLN
0.5000 mg | Freq: Four times a day (QID) | INTRAMUSCULAR | Status: DC | PRN
  Administered 2023-07-08 – 2023-07-11 (×6): 0.5 mg via INTRAVENOUS
  Filled 2023-07-07 (×6): qty 1

## 2023-07-07 MED ORDER — FENTANYL CITRATE (PF) 100 MCG/2ML IJ SOLN: 25.0000 ug | INTRAMUSCULAR | Status: DC | PRN

## 2023-07-07 MED ORDER — LIDOCAINE 2% (20 MG/ML) 5 ML SYRINGE
INTRAMUSCULAR | Status: AC
  Filled 2023-07-07: qty 5

## 2023-07-07 MED ORDER — ORAL CARE MOUTH RINSE: 15.0000 mL | Freq: Once | OROMUCOSAL | Status: AC

## 2023-07-07 MED ORDER — LACTATED RINGERS IV SOLN: INTRAVENOUS | Status: DC

## 2023-07-07 MED ORDER — ROCURONIUM BROMIDE 10 MG/ML (PF) SYRINGE
PREFILLED_SYRINGE | INTRAVENOUS | Status: AC
  Filled 2023-07-07: qty 10

## 2023-07-07 MED ORDER — FENTANYL CITRATE (PF) 250 MCG/5ML IJ SOLN
INTRAMUSCULAR | Status: AC
  Filled 2023-07-07: qty 5

## 2023-07-07 MED ORDER — CHLORHEXIDINE GLUCONATE 0.12 % MT SOLN
OROMUCOSAL | Status: AC
  Administered 2023-07-07: 15 mL via OROMUCOSAL
  Filled 2023-07-07: qty 15

## 2023-07-07 MED ORDER — LIDOCAINE 2% (20 MG/ML) 5 ML SYRINGE
INTRAMUSCULAR | Status: DC | PRN
  Administered 2023-07-07: 20 mg via INTRAVENOUS

## 2023-07-07 MED ORDER — FENTANYL CITRATE PF 50 MCG/ML IJ SOSY
25.0000 ug | PREFILLED_SYRINGE | INTRAMUSCULAR | Status: DC | PRN
  Administered 2023-07-08: 50 ug via INTRAVENOUS
  Administered 2023-07-08: 25 ug via INTRAVENOUS
  Administered 2023-07-09 (×2): 50 ug via INTRAVENOUS
  Administered 2023-07-09: 25 ug via INTRAVENOUS
  Administered 2023-07-10 – 2023-07-11 (×6): 50 ug via INTRAVENOUS
  Filled 2023-07-07 (×12): qty 1

## 2023-07-07 MED ORDER — DEXAMETHASONE SODIUM PHOSPHATE 10 MG/ML IJ SOLN
INTRAMUSCULAR | Status: AC
  Filled 2023-07-07: qty 1

## 2023-07-07 MED ORDER — BUPIVACAINE IN DEXTROSE 0.75-8.25 % IT SOLN
INTRATHECAL | Status: DC | PRN
  Administered 2023-07-07: 1.2 mL via INTRATHECAL

## 2023-07-07 MED ORDER — DEXTROSE-SODIUM CHLORIDE 5-0.45 % IV SOLN: INTRAVENOUS | Status: AC

## 2023-07-07 MED ORDER — 0.9 % SODIUM CHLORIDE (POUR BTL) OPTIME
TOPICAL | Status: DC | PRN
  Administered 2023-07-07: 1000 mL

## 2023-07-07 MED ORDER — CEFAZOLIN SODIUM-DEXTROSE 1-4 GM/50ML-% IV SOLN
INTRAVENOUS | Status: AC
  Filled 2023-07-07: qty 50

## 2023-07-07 MED ORDER — ONDANSETRON HCL 4 MG/2ML IJ SOLN
INTRAMUSCULAR | Status: AC
  Filled 2023-07-07: qty 2

## 2023-07-07 MED ORDER — PROPOFOL 10 MG/ML IV BOLUS
INTRAVENOUS | Status: DC | PRN
  Administered 2023-07-07: 20 ug/kg/min via INTRAVENOUS
  Administered 2023-07-07: 20 mg via INTRAVENOUS
  Administered 2023-07-07 (×2): 10 mg via INTRAVENOUS
  Administered 2023-07-07: 20 mg via INTRAVENOUS

## 2023-07-07 MED ORDER — TRANEXAMIC ACID-NACL 1000-0.7 MG/100ML-% IV SOLN
INTRAVENOUS | Status: AC
  Filled 2023-07-07: qty 100

## 2023-07-07 MED ORDER — FENTANYL CITRATE (PF) 250 MCG/5ML IJ SOLN
INTRAMUSCULAR | Status: DC | PRN
  Administered 2023-07-07: 25 ug via INTRAVENOUS

## 2023-07-07 MED ORDER — PROPOFOL 1000 MG/100ML IV EMUL
INTRAVENOUS | Status: AC
Start: 2023-07-07 — End: ?
  Filled 2023-07-07: qty 100

## 2023-07-07 MED ORDER — PROPOFOL 10 MG/ML IV BOLUS
INTRAVENOUS | Status: AC
  Filled 2023-07-07: qty 20

## 2023-07-07 MED ORDER — CHLORHEXIDINE GLUCONATE 0.12 % MT SOLN
15.0000 mL | Freq: Once | OROMUCOSAL | Status: AC
Start: 2023-07-07 — End: 2023-07-07

## 2023-07-07 SURGICAL SUPPLY — 41 items
BAG COUNTER SPONGE SURGICOUNT (BAG) ×2 IMPLANT
BIT DRILL 3.2 QUICK MINI 300 (DRILL) IMPLANT
BIT DRILL 5.0 QC 6.5 (BIT) IMPLANT
BNDG COHESIVE 4X5 TAN STRL (GAUZE/BANDAGES/DRESSINGS) ×2 IMPLANT
BNDG GAUZE DERMACEA FLUFF 4 (GAUZE/BANDAGES/DRESSINGS) ×2 IMPLANT
COVER PERINEAL POST (MISCELLANEOUS) ×2 IMPLANT
DERMABOND ADVANCED .7 DNX12 (GAUZE/BANDAGES/DRESSINGS) IMPLANT
DRAPE STERI IOBAN 125X83 (DRAPES) ×2 IMPLANT
DRESSING MEPILEX FLEX 4X4 (GAUZE/BANDAGES/DRESSINGS) ×2 IMPLANT
DRSG ADAPTIC 3X8 NADH LF (GAUZE/BANDAGES/DRESSINGS) ×2 IMPLANT
DRSG MEPILEX FLEX 4X4 (GAUZE/BANDAGES/DRESSINGS)
DRSG MEPILEX POST OP 4X8 (GAUZE/BANDAGES/DRESSINGS) IMPLANT
DURAPREP 26ML APPLICATOR (WOUND CARE) ×2 IMPLANT
ELECT CAUTERY BLADE 6.4 (BLADE) ×2 IMPLANT
ELECT REM PT RETURN 9FT ADLT (ELECTROSURGICAL) ×1
ELECTRODE REM PT RTRN 9FT ADLT (ELECTROSURGICAL) ×2 IMPLANT
GLOVE BIOGEL PI IND STRL 7.0 (GLOVE) ×4 IMPLANT
GLOVE BIOGEL PI IND STRL 7.5 (GLOVE) ×2 IMPLANT
GLOVE ECLIPSE 7.0 STRL STRAW (GLOVE) ×2 IMPLANT
GLOVE SKINSENSE STRL SZ7.5 (GLOVE) ×4 IMPLANT
GLOVE SURG SYN 7.5 E (GLOVE) ×1 IMPLANT
GLOVE SURG SYN 7.5 PF PI (GLOVE) ×4 IMPLANT
GLOVE SURG UNDER POLY LF SZ7 (GLOVE) ×38 IMPLANT
GLOVE SURG UNDER POLY LF SZ7.5 (GLOVE) ×8 IMPLANT
GOWN STRL SURGICAL XL XLNG (GOWN DISPOSABLE) ×6 IMPLANT
KIT BASIN OR (CUSTOM PROCEDURE TRAY) ×2 IMPLANT
KIT TURNOVER KIT B (KITS) ×2 IMPLANT
MANIFOLD NEPTUNE II (INSTRUMENTS) ×2 IMPLANT
NS IRRIG 1000ML POUR BTL (IV SOLUTION) ×2 IMPLANT
PACK GENERAL/GYN (CUSTOM PROCEDURE TRAY) ×2 IMPLANT
PAD ARMBOARD 7.5X6 YLW CONV (MISCELLANEOUS) ×4 IMPLANT
SCREW CANN PTHRD 6.5X65 (Screw) IMPLANT
SCREW CANN PTHRD 6.5X70 (Screw) IMPLANT
SCREW THREADED 6.5X85 (Screw) IMPLANT
STAPLER VISISTAT 35W (STAPLE) IMPLANT
SUT VIC AB 0 CT1 27XBRD ANBCTR (SUTURE) ×2 IMPLANT
SUT VIC AB 2-0 CT1 TAPERPNT 27 (SUTURE) ×2 IMPLANT
TOWEL GREEN STERILE (TOWEL DISPOSABLE) ×2 IMPLANT
TOWEL GREEN STERILE FF (TOWEL DISPOSABLE) ×2 IMPLANT
WASHER CANN 12.7 STRL (Washer) IMPLANT
WATER STERILE IRR 1000ML POUR (IV SOLUTION) ×2 IMPLANT

## 2023-07-07 NOTE — Progress Notes (Signed)
PROGRESS NOTE    Tracy Malone  KGM:[id number] DOB: Apr 17, 1932 DOA: 07/05/2023 PCP: Joselyn Arrow, MD    Chief Complaint  Patient presents with   Fall    Brief Narrative:    Tracy Malone is a 89 y.o. female with medical history significant of carotid stenosis, fibromyalgia, hypothyroidism, and Parkinson's who presented on 12/12 with a fall.  She has a R humerus and R hip fracture.  Patient has been on home hospice for FTT.  She got up today and had a mechanical fall, landing on R side and injuring both her R arm and leg, she was admitted for further workup. Assessment & Plan:   Principal Problem:   Closed fracture of neck of right femur (HCC) Active Problems:   Closed fracture of right proximal forearm   Closed fracture of right proximal humerus     Femoral neck fracture Apparently mechanical fall resulting in hip fracture Orthopedics consulted, they have discussed with the family, at this point daughter, son-in-law proceeded to proceed with surgery, -DVT prophylaxis per general surgery -PT-OT consulted, going to be difficult specially with her humerus fracture Family is deciding about whether to proceed with surgery   Humerus fracture Non-operative Will need sling and pain control This will complicate use of a walker regarding hip fracture   Parkinson's Continue Sinemet   Carotid stenosis No longer appears to be following with vascular   Fibromyalgia/IBS She "hurts all the time" Continue Bentyl prn   Hypothyroidism Continue Synthroid   Anxiety Continue alprazolam, fluoxetine   Leukocytosis -Most likely stress related, she is nontoxic-appearing, will check UA no acute findings on chest x-ray      DVT prophylaxis: Open ox postop Code Status: DNR Family Communication: Discussed with son-in-law at bedside, and daughter by phone Disposition:   Status is: Inpatient    Consultants:  Orthopedic   Subjective:  Patient complains of right hip pain, lower  back pain this morning  Objective: Vitals:   07/07/23 0403 07/07/23 0404 07/07/23 0800 07/07/23 1016  BP:   (!) 116/46   Pulse: 94 94 92   Resp:   18   Temp:   98.5 F (36.9 C)   TempSrc:   Oral   SpO2: (!) 89% 92% 91%   Weight:    37 kg  Height:    4' 7.98" (1.422 m)    Intake/Output Summary (Last 24 hours) at 07/07/2023 1026 Last data filed at 07/07/2023 0600 Gross per 24 hour  Intake --  Output 600 ml  Net -600 ml   Filed Weights   07/06/23 1329 07/07/23 1016  Weight: 37 kg 37 kg    Examination:  Awake Alert, x 2, answering most questions appropriately, but some confusion as there, mildly uncomfortable due to right hip and lower back pain, extremely frail, deconditioned Symmetrical Chest wall movement, Good air movement bilaterally, CTAB RRR,No Gallops,Rubs or new Murmurs, No Parasternal Heave +ve B.Sounds, Abd Soft, No tenderness, No rebound - guarding or rigidity. Right arm in a sling    Data Reviewed: I have personally reviewed following labs and imaging studies  CBC: Recent Labs  Lab 07/05/23 2331 07/07/23 0452  WBC 19.3* 25.6*  HGB 11.6* 12.4  HCT 34.8* 38.8  MCV 94.3 96.0  PLT 196 200    Basic Metabolic Panel: Recent Labs  Lab 07/05/23 2331 07/07/23 0452  NA 138 141  K 4.3 5.0  CL 102 105  CO2 25 21*  GLUCOSE 134* 79  BUN 19 19  CREATININE 0.58 0.88  CALCIUM 9.3 9.5    GFR: Estimated Creatinine Clearance: 24.8 mL/min (by C-G formula based on SCr of 0.88 mg/dL).  Liver Function Tests: No results for input(s): "AST", "ALT", "ALKPHOS", "BILITOT", "PROT", "ALBUMIN" in the last 168 hours.  CBG: Recent Labs  Lab 07/07/23 0209  GLUCAP 94     No results found for this or any previous visit (from the past 240 hours).       Radiology Studies: CT Hip Right Wo Contrast Result Date: 07/05/2023 CLINICAL DATA:  Recent fall with leg pain, initial encounter EXAM: CT OF THE RIGHT HIP WITHOUT CONTRAST TECHNIQUE: Multidetector CT imaging  of the right hip was performed according to the standard protocol. Multiplanar CT image reconstructions were also generated. RADIATION DOSE REDUCTION: This exam was performed according to the departmental dose-optimization program which includes automated exposure control, adjustment of the mA and/or kV according to patient size and/or use of iterative reconstruction technique. COMPARISON:  Films from earlier in the same day. FINDINGS: Bones/Joint/Cartilage There is an right impacted subcapital femoral neck fracture identified. No dislocation is seen. No intratrochanteric component is noted. No other fractures are seen. No joint effusion is noted. Ligaments Suboptimally assessed by CT. Muscles and Tendons Surrounding musculature appears within normal limits. Soft tissues No soft tissue abnormality is noted. The visualized pelvic structures are within limits. IMPRESSION: Impacted subcapital femoral neck fracture on the right. Electronically Signed   By: Alcide Clever M.D.   On: 07/05/2023 23:59   DG HIP PORT UNILAT WITH PELVIS 1V RIGHT Result Date: 07/05/2023 CLINICAL DATA:  Fall, pain EXAM: DG HIP (WITH OR WITHOUT PELVIS) 1V PORT RIGHT COMPARISON:  None Available. FINDINGS: No definite femoral neck fracture seen on this single view. AP view on the pelvis series is concerning for impacted femoral neck fracture. No subluxation or dislocation. IMPRESSION: No definite fracture seen on this single view. Consider further evaluation with CT given the appearance on pelvis series. Electronically Signed   By: Charlett Nose M.D.   On: 07/05/2023 23:29   DG Shoulder Right Result Date: 07/05/2023 CLINICAL DATA:  Right shoulder pain, fall EXAM: RIGHT SHOULDER - 2+ VIEW COMPARISON:  None Available. FINDINGS: Proximal right humeral fracture involving the right humeral neck and greater tuberosity region. No subluxation or dislocation. IMPRESSION: Right humeral neck/head fracture. Electronically Signed   By: Charlett Nose M.D.    On: 07/05/2023 23:27   CT Cervical Spine Wo Contrast Result Date: 07/05/2023 CLINICAL DATA:  Neck trauma (Age >= 65y).  Fall. EXAM: CT CERVICAL SPINE WITHOUT CONTRAST TECHNIQUE: Multidetector CT imaging of the cervical spine was performed without intravenous contrast. Multiplanar CT image reconstructions were also generated. RADIATION DOSE REDUCTION: This exam was performed according to the departmental dose-optimization program which includes automated exposure control, adjustment of the mA and/or kV according to patient size and/or use of iterative reconstruction technique. COMPARISON:  None available FINDINGS: Alignment: Normal Skull base and vertebrae: No acute fracture. No primary bone lesion or focal pathologic process. Soft tissues and spinal canal: No prevertebral fluid or swelling. No visible canal hematoma. Disc levels: Mild diffuse degenerative disc disease and facet disease. Upper chest: No acute findings Other: None IMPRESSION: No acute bony abnormality. Electronically Signed   By: Charlett Nose M.D.   On: 07/05/2023 23:23   CT Head Wo Contrast Result Date: 07/05/2023 CLINICAL DATA:  Head trauma, minor (Age >= 65y).  Fall. EXAM: CT HEAD WITHOUT CONTRAST TECHNIQUE: Contiguous axial images were obtained from the base of  the skull through the vertex without intravenous contrast. RADIATION DOSE REDUCTION: This exam was performed according to the departmental dose-optimization program which includes automated exposure control, adjustment of the mA and/or kV according to patient size and/or use of iterative reconstruction technique. COMPARISON:  None Available. FINDINGS: Brain: There is atrophy and chronic small vessel disease changes. No acute intracranial abnormality. Specifically, no hemorrhage, hydrocephalus, mass lesion, acute infarction, or significant intracranial injury. Vascular: No hyperdense vessel or unexpected calcification. Skull: No acute calvarial abnormality. Sinuses/Orbits: No acute  findings Other: None IMPRESSION: Atrophy, chronic microvascular disease. No acute intracranial abnormality. Electronically Signed   By: Charlett Nose M.D.   On: 07/05/2023 23:21   DG Pelvis Portable Result Date: 07/05/2023 CLINICAL DATA:  Fall EXAM: PORTABLE PELVIS 1-2 VIEWS COMPARISON:  None Available. FINDINGS: There is foreshortening of the right femoral neck concerning for impacted fracture. No subluxation or dislocation. IMPRESSION: Concern for impacted right femoral neck fracture. Electronically Signed   By: Charlett Nose M.D.   On: 07/05/2023 23:09   DG Hand Complete Right Result Date: 07/05/2023 CLINICAL DATA:  Fall, right side pain EXAM: RIGHT HAND - COMPLETE 3+ VIEW COMPARISON:  None Available. 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 Diffuse osteopenia. No acute bony abnormality. Specifically, no fracture, subluxation, or dislocation. Soft tissues are intact. IMPRESSION: No acute bony abnormality. Electronically Signed   By: Charlett Nose M.D.   On: 07/05/2023 23:04   DG Forearm Right Result Date: 07/05/2023 CLINICAL DATA:  Fall, right side pain EXAM: RIGHT FOREARM - 2 VIEW COMPARISON:  None Available. FINDINGS: There is no evidence of fracture or other focal bone lesions. Soft tissues are unremarkable. IMPRESSION: Negative. Electronically Signed   By: Charlett Nose M.D.   On: 07/05/2023 23:04   DG Humerus Right Result Date: 07/05/2023 CLINICAL DATA:  Fall, right side pain EXAM: RIGHT HUMERUS - 2+ VIEW COMPARISON:  None Available. FINDINGS: There is a proximal right humeral fracture involving the greater tuberosity region of the humeral head. Extension across the humeral neck suspected but difficult to visualize. No subluxation or dislocation. IMPRESSION: Proximal right humeral fracture involving the greater tuberosity and possibly humeral neck. Electronically Signed   By: Charlett Nose M.D.   On: 07/05/2023 23:03   DG Femur 1 View Right Result Date: 07/05/2023 CLINICAL DATA:   Fall, pain EXAM: RIGHT FEMUR 1 VIEW COMPARISON:  None Available. FINDINGS: Limited single view of the right femur. No visible fracture, subluxation or dislocation. IMPRESSION: Limited study.  No visible acute bony abnormality. Electronically Signed   By: Charlett Nose M.D.   On: 07/05/2023 23:03   DG Chest Portable 1 View Result Date: 07/05/2023 CLINICAL DATA:  Fall EXAM: PORTABLE CHEST 1 VIEW COMPARISON:  01/30/2022 FINDINGS: Heart and mediastinal contours are within normal limits. No focal opacities or effusions. Proximal right humeral fracture in the region of the humeral neck/head. See humerus series report. IMPRESSION: No acute cardiopulmonary disease. Proximal right humeral fracture. Electronically Signed   By: Charlett Nose M.D.   On: 07/05/2023 23:02        Scheduled Meds:  [MAR Hold] acidophilus  1 capsule Oral Once per day on Monday Thursday   [facility] Hold] Carbidopa-Levodopa ER  1 tablet Oral TID   chlorhexidine  15 mL Mouth/Throat Once   Or   mouth rinse  15 mL Mouth Rinse Once   chlorhexidine       [MAR Hold] feeding supplement  237 mL Oral BID BM   [MAR Hold] FLUoxetine  10 mg Oral Daily   [MAR Hold] levothyroxine  25 mcg Oral Q0600   [MAR Hold] polyethylene glycol  17 g Oral Daily   Continuous Infusions:  [MAR Hold] clindamycin (CLEOCIN) IV     dextrose 5 % and 0.45 % NaCl     lactated ringers     tranexamic acid     [MAR Hold] tranexamic acid       LOS: 1 day      Huey Bienenstock, MD [facility]   To contact the attending provider between 7A-7P or the covering provider during after hours 7P-7A, please log into the web site [email or web address] and access using universal [place] password for that web site. If you do not have the password, please call the hospital operator.  07/07/2023, 10:26 AM

## 2023-07-07 NOTE — Progress Notes (Signed)
   07/07/23 0600  Urine Characteristics  Bladder Scan Volume (mL) 557 mL  Intermittent/Straight Cath (mL) 600 mL

## 2023-07-07 NOTE — Progress Notes (Signed)
Tracy Malone [id number] [facility] Patient Visit   [name] is a current hospice patient, with hospice diagnosis of Parkinson's unspecified and protein calorie malnutrition. She was admitted 07/05/23 with diagnosis of Right hip and Right humeral fracture. [facility] was notified by husband that patient had fallen and Hospice nurse was with patient when EMS arrived. Per Dr. Patric Dykes, hospice physician, this is a related hospital admission.    Visited patient in the hospital but she was still in post op after her procedure for fixation of the right femoral neck. Spoke with daughter by phone who reported that surgery went well. She has concerns about physical therapy needs with regards to hospice post discharge. Answered her questions and advised we will follow progress.   Patient is GIP appropriate due to need for IV pain medications and surgical stabilization of fracture.  Vital Signs: 98.1/85/21   103/46    93% on RA   I&O: not documented/600   Abnormal labs: WBC 25.6, CO2 21   Diagnostics:  Xrays hip, arm, pelvis   IV/PRN Meds: Dilaudid 0.5 - 1 mg IV x 2   Problem list per Elgergawy, Leana Roe, MD progress note dated 12.14.24 Assessment & Plan:   Principal Problem:   Closed fracture of neck of right femur (HCC) Active Problems:   Closed fracture of right proximal forearm   Closed fracture of right proximal humerus    Femoral neck fracture Apparently mechanical fall resulting in hip fracture Orthopedics consulted, they have discussed with the family, at this point daughter, son-in-law proceeded to proceed with surgery, -DVT prophylaxis per general surgery -PT-OT consulted, going to be difficult specially with her humerus fracture Family is deciding about whether to proceed with surgery   Humerus fracture Non-operative Will need sling and pain control This will complicate use of a walker regarding hip fracture   Parkinson's Continue  Sinemet   Carotid stenosis No longer appears to be following with vascular   Fibromyalgia/IBS She "hurts all the time" Continue Bentyl prn   Hypothyroidism Continue Synthroid   Anxiety Continue alprazolam, fluoxetine   Leukocytosis -Most likely stress related, she is nontoxic-appearing, will check UA no acute findings on chest x-ray   Discharge Planning: Ongoing   Family Contact: spoke with daughter by phone   IDT: Updated   Goals of Care: DNR   [facility] hospital liaison [phone]

## 2023-07-07 NOTE — Brief Op Note (Signed)
07/07/2023  1:11 PM  PATIENT:  Alinda Dooms  89 y.o. female  PRE-OPERATIVE DIAGNOSIS:  Right Hip Fracture  POST-OPERATIVE DIAGNOSIS:  Right Hip Fracture  PROCEDURE:  Procedure(s): PERCUTANEOUS FIXATION OF FEMORAL NECK (Right)  SURGEON:  Surgeons and Role:    London Sheer, MD - Primary  PHYSICIAN ASSISTANT: none  ASSISTANTS: none   ANESTHESIA:   spinal  EBL:  20 mL   BLOOD ADMINISTERED:none  DRAINS: none   LOCAL MEDICATIONS USED:  NONE  SPECIMEN:  No Specimen  DISPOSITION OF SPECIMEN:  N/A  COUNTS:  YES  TOURNIQUET:  None  DICTATION: .Note written in EPIC  PLAN OF CARE: Admit to inpatient   PATIENT DISPOSITION:  PACU - hemodynamically stable.   Delay start of Pharmacological VTE agent (>24hrs) due to surgical blood loss or risk of bleeding: no

## 2023-07-07 NOTE — Evaluation (Signed)
SLP Cancellation Note  Patient Details Name: GENNI CARLOCK MRN: [id number] DOB: [birth year]   Cancelled treatment:       Reason Eval/Treat Not Completed: Other (comment);Medical issues which prohibited therapy (pt is npo for orthopedic surgery today; will continue efforts)   Chales Abrahams 07/07/2023, 7:46 AM Rolena Infante, MS [facility] SLP Acute Rehab Services Office [phone]

## 2023-07-07 NOTE — Transfer of Care (Signed)
Immediate Anesthesia Transfer of Care Note  Patient: Tracy Malone  Procedure(s) Performed: PERCUTANEOUS FIXATION OF FEMORAL NECK (Right: Hip)  Patient Location: PACU  Anesthesia Type:Spinal  Level of Consciousness: awake, alert , oriented, and patient cooperative  Airway & Oxygen Therapy: Patient Spontanous Breathing and Patient connected to nasal cannula oxygen  Post-op Assessment: Report given to RN and Post -op Vital signs reviewed and stable  Post vital signs: Reviewed and stable  Last Vitals:  Vitals Value Taken Time  BP 152/52 07/07/23 1308  Temp 98.42F   Pulse 87 07/07/23 1312  Resp 21 07/07/23 1312  SpO2 94 % 07/07/23 1312  Vitals shown include unfiled device data.  Last Pain:  Vitals:   07/07/23 1038  TempSrc: Oral  PainSc:       Patients Stated Pain Goal: 0 (07/07/23 0600)  Complications: There were no known notable events for this encounter.

## 2023-07-07 NOTE — Op Note (Signed)
Orthopedic Surgery Operative Report   Procedure: Right femoral neck fracture percutaneous screw fixation   Modifier: none   Date of procedure: 07/07/2023   Patient name: Tracy Malone MRN: [id number] DOB: 1932/03/14   Surgeon: Willia Craze, MD Assistant: None Pre-operative diagnosis: right valgus impacted femoral neck fracture Post-operative diagnosis: same as above Findings: right valgus impacted femoral neck fracture   Specimens: none Anesthesia: spinal EBL: 20cc Complications: none Pre-incision antibiotic: ancef TXA was given prior to incision as well   Implants:  Implant Name Type Inv. Item Serial No. Manufacturer Lot No. LRB No. Used Action  WASHER CANN 12.7 STRL - [id number] Washer WASHER CANN 12.7 STRL  [employer] [id number] Right 2 Implanted  WASHER CANN 12.7 STRL - [id number] Washer WASHER CANN 12.7 STRL  [employer] [id number] Right 1 Implanted  6.5x85 PT screw    [employer]  Right 1 Implanted  6.5x65 PT screw    [employer]  Right 1 Implanted  6.5x70 PT screw    [employer]  Right 1 Implanted      Indication for procedure: Patient is a 89 year old female who presented to the ER after a fall. The patient had right hip pain and x-rays revealed a valgus impacted right femoral neck fracture. The patient was admitted to a medicine service with orthopedics consulted. I met the patient and discussed the fracture. I also discussed it with the patient's daughter, Tracy Malone. I recommended operative management in the form of percutaneous screw fixation. Explained the risks of this procedure included, but were not limited to: nonunion, malunion, fixation failure or malposition, infection, bleeding, stiffness, peri-implant fracture, need for additional procedures, deep vein thrombosis, pulmonary embolism, MI, and death. The alternatives of this surgery would be to treat the fracture with immobilization or  to perform no intervention. After our discussion, patient and her daughter elected to proceed with surgery.    Procedure Description: The patient was met in the pre-operative holding area. The patient's identity and consent were verified. The operative site was marked by myself. The patient and her daughter's remaining questions about the surgery were answered. The patient was brought back to the operating room. Patient was sedated and spinal anesthesia was performed. The patient was transferred to the [facility] table. All bony prominences were well padded. Fluoroscopy confirmed similar position of the fracture when compared to her films obtained in the ER. The surgical area was cleansed with alcohol. Clindamycin and TXA were administered by anesthesia. The patient's skin was then prepped and draped in a standard, sterile fashion. A time out was performed that identified the patient, the procedure, and the operative site. All team members agreed with what was stated in the time out.    Fluoroscopy was brought in to mark the approximated start point for the pins. A lateral longitudinal incision was made over this area. Incision was taken sharply down through skin, dermis, and fascia. A guide pin was then placed over the lateral aspect of the proximal femur just proximal to the lesser trochanter. The pin was advanced under AP and lateral fluoroscopic guidance along the inferior aspect of the femoral neck. The same process was repeated to place two more pins in the more proximal aspect of the femora neck. One of these pins was placed more anteriorly and one was placed more posteriorly. AP and lateral fluoroscopy confirmed satisfactory position of the pins. The estimated length of the screws was measured off of  the wires that had been placed. 6.38mm diameter screws were selected as the size prior to surgery. First, the inferior screw was placed over the guide wire which was a 6.5x85 screw. Then, the guide wire was removed  once good purchase was obtained. The same process was repeated to insert the other two cannulated screws. Once all three screws had been placed and the guide pins had been removed, final AP and lateral fluoroscopic images were obtained which confirmed satisfactory position of the fixation in both the AP and lateral planes.    The wound was copiously irrigated with sterile saline. The fascia was closed with 0 vicryl. The deep dermal layer was closed with 2-0 vicryl. The skin was closed with monocryl. Dermabond was applied over the skin. Dressings were applied. All counts were correct at the end of the case. Patient was transferred back to a hospital bed. The patient was awakened from sedation and brought back to the post-anesthesia care unit in stable condition.     Post-operative plan: The patient will recover in the post-anesthesia care unit and then go to the floor on the medicine service. The patient will receive two post-operative doses of clindamycin. The patient will be weight bearing as tolerated on her right lower extremity. The patient will work with physical therapy. Xarelto will be started as her dvt ppx due to an allergy to aspirin. The patient's disposition will be determined by the medicine service.        Willia Craze, MD Orthopedic Surgeon

## 2023-07-07 NOTE — Discharge Instructions (Signed)
Orthopedic Surgery Discharge Instructions  Patient name: Tracy Malone Fracture: right femoral neck fracture Procedure Performed: right femoral neck fracture percutaneous pinning Date of Surgery: 07/07/2023 Surgeon: Willia Craze, MD  Activity: You are allowed to put as much weight on your leg as you would like. You can walk as much as you would like. You can perform household activities such as cleaning dishes, doing laundry, vacuuming, etc.  Incision Care: Your incision site has a dressing over it. That dressing should remain in place and dry at all times for a total of one week after surgery. After one week, you can remove the dressing. Underneath the dressing, you will find skin glue. You should leave the glue in place. They will fall off with time. Do not pick, rub, or scrub at the glue. Do not put cream or lotion over the surgical area. After one week and once the dressing is off, it is okay to let soap and water run over your incision. Again, do not pick, scrub, or rub at the glue when bathing. Do not submerge (e.g., take a bath, swim, go in a hot tub, etc.) until six weeks after surgery. There may be some bloody drainage from the incision into the dressing after surgery. This is normal. You do not need to replace the dressing. Continue to leave it in place for the one week as instructed above. Should the dressing become saturated with blood or drainage, please call the office for further instructions.   Medications: You have been prescribed oxycodone. This is a narcotic pain medication and should only be taken as prescribed. You should not drink alcohol or operate heavy machinery (including driving) while taking this medication. The oxycodone can cause constipation as a side effect. For that reason, you have been prescribed senna and miralax. These are both laxatives. You do not need to take this medication if you develop diarrhea. Should you remain constipated even while taking the senna and  miralax, please use the miralax twice daily. Tylenol has been prescribed to be taken every 8 hours, which will give you additional pain relief.   You have been prescribed Xarelto as a blood thinner. This medication is to be taken to prevent blood clots. Take 10 milligrams once per day. You should refrain from using other blood thinners (warfarin, apixaban, plavix, xarelto, etc.) while using the Xarelto. You will need to take this medication for a total of 6 weeks after your surgery.   You should not use over-the-counter NSAIDs (ibuprofen, Aleve, Celebrex, naproxen, meloxicam, etc.) for pain relief because they can thin your blood similar to what Xarelto is doing.  In order to set expectations for opioid prescriptions, you will only be prescribed opioids for a total of six weeks after surgery and, at two-weeks after surgery, your opioid prescription will start to tapered (decreased dosage and number of pills). If you have ongoing need for opioid medication six weeks after surgery, you will be referred to pain management. If you are already established with a provider that is giving you opioid medications, you should schedule an appointment with them for six weeks after surgery if you feel you are going to need another prescription. State law only allows for opioid prescriptions one week at a time. If you are running out of opioid medication near the end of the week, please call the office during business hours before running out so I can send you another prescription.   Diet: You are safe to resume your regular diet after  surgery.   Reasons to Call the Office After Surgery: You should feel free to call the office with any concerns or questions you have in the post-operative period, but you should definitely notify the office if you develop: -shortness of breath, chest pain, or trouble breathing -excessive bleeding, drainage, redness, or swelling around the surgical site -fevers, chills, or pain that is  getting worse with each passing day -persistent nausea or vomiting -new weakness in the right leg, new or worsening numbness or tingling in the right leg -other concerns about your surgery  Follow Up Appointments: You have a follow up appointment with Dr. Christell Constant scheduled on 07/23/2023 at 4:15pm. Please arrive on time. Dr. Christell Constant will get x-rays and look at your incision at that visit.  Office Information:  -Willia Craze, MD -Phone number: [phone] -Address: [address]       [place], [place] [address]

## 2023-07-07 NOTE — Progress Notes (Addendum)
Patient complains of tinnitus after each administration of Dilaudid. Plan to try a different analgesic.

## 2023-07-07 NOTE — Progress Notes (Addendum)
Orthopedic Surgery Progress Note   Assessment: Patient is a 89 y.o. female with right femoral neck fracture, right proximal humerus fracture   Plan: -Planning for operative fixation of her right hip today -Diet: NPO for procedure -DVT ppx: will start xarelto post-operatively since she has an aspirin allergy -Antibiotics: clindamycin on call to OR -Weight bearing status: NWB RLE and RUE, will be WBAT RLE post-op -PT evaluate and treat -Pain control -Dispo: pending completion of operative plans  ___________________________________________________________________________  Subjective: No acute events overnight. Pain well controlled. Understands plan for surgery this morning. All remaining questions were answered to her satisfaction.    Physical Exam:  General: no acute distress, appears stated age Neurologic: alert, answering questions appropriately, following commands Respiratory: unlabored breathing on room air, symmetric chest rise Psychiatric: appropriate affect, normal cadence to speech  MSK:   -Right upper extremity Sling in place  AIN/PIN/IO intact  Palpable radial pulse  Sensation intact to light touch in median/ulnar/radial/axillary nerve distributions  Hand warm and well perfused  -Right lower extremity EHL/TA/GSC intact Plantarflexes and dorsiflexes toes Sensation intact to light touch in sural, saphenous, tibial, deep peroneal, and superficial peroneal nerve distributions Foot warm and well perfused   Patient name: Tracy Malone Patient MRN: [id number] Date: 07/07/23

## 2023-07-07 NOTE — Anesthesia Preprocedure Evaluation (Signed)
Anesthesia Evaluation  Patient identified by MRN, date of birth, ID band Patient awake    Reviewed: Allergy & Precautions, NPO status , Patient's Chart, lab work & pertinent test results  Airway Mallampati: II  TM Distance: >3 FB Neck ROM: Full    Dental no notable dental hx.    Pulmonary    Pulmonary exam normal        Cardiovascular + Peripheral Vascular Disease  + dysrhythmias  Rhythm:Regular Rate:Normal     Neuro/Psych  Headaches  Anxiety Depression    Parkinsons    GI/Hepatic Neg liver ROS, hiatal hernia, PUD,GERD  ,,  Endo/Other  Hypothyroidism    Renal/GU   negative genitourinary   Musculoskeletal  (+)  Fibromyalgia -Hip fracture    Abdominal Normal abdominal exam  (+)   Peds  Hematology Lab Results      Component                Value               Date                      WBC                      25.6 (H)            07/07/2023                HGB                      12.4                07/07/2023                HCT                      38.8                07/07/2023                MCV                      96.0                07/07/2023                PLT                      200                 07/07/2023              Anesthesia Other Findings   Reproductive/Obstetrics                             Anesthesia Physical Anesthesia Plan  ASA: 3  Anesthesia Plan: MAC and Spinal   Post-op Pain Management:    Induction: Intravenous  PONV Risk Score and Plan: 2 and Ondansetron, Dexamethasone, Propofol infusion and Treatment may vary due to age or medical condition  Airway Management Planned: Simple Face Mask and Nasal Cannula  Additional Equipment: None  Intra-op Plan:   Post-operative Plan:   Informed Consent: I have reviewed the patients History and Physical, chart, labs and discussed the procedure including the risks, benefits and alternatives for the proposed anesthesia  with the patient or  authorized representative who has indicated his/her understanding and acceptance.   Patient has DNR.   Dental advisory given  Plan Discussed with: CRNA  Anesthesia Plan Comments:        Anesthesia Quick Evaluation

## 2023-07-07 NOTE — H&P (Signed)
Orthopedic Surgery H&P Update  Patient's history and physical from yesterday reviewed - no updates at this time Risks of surgery were covered again with patient and patient's daughter [name], patient and her daughter elected to continue with planned procedure Written consent verified Site marked Hold anticoagulation in anticipation of surgery Clindamycin on call to OR TXA on call to OR To OR when ready   Willia Craze, MD Orthopedic Surgeon

## 2023-07-07 NOTE — Anesthesia Postprocedure Evaluation (Signed)
Anesthesia Post Note  Patient: Tracy Malone  Procedure(s) Performed: PERCUTANEOUS FIXATION OF FEMORAL NECK (Right: Hip)     Patient location during evaluation: PACU Anesthesia Type: MAC and Spinal Level of consciousness: awake and alert Pain management: pain level controlled Vital Signs Assessment: post-procedure vital signs reviewed and stable Respiratory status: spontaneous breathing, nonlabored ventilation, respiratory function stable and patient connected to nasal cannula oxygen Cardiovascular status: stable and blood pressure returned to baseline Postop Assessment: no apparent nausea or vomiting Anesthetic complications: no   There were no known notable events for this encounter.  Last Vitals:  Vitals:   07/07/23 1415 07/07/23 1430  BP: (!) 102/49 (!) 103/46  Pulse: 95 85  Resp: (!) 21 (!) 21  Temp:    SpO2: 93% 93%    Last Pain:  Vitals:   07/07/23 1430  TempSrc:   PainSc: 0-No pain                 Earl Lites P Aurore Redinger

## 2023-07-07 NOTE — Anesthesia Procedure Notes (Signed)
Spinal  Patient location during procedure: OR Start time: 07/07/2023 11:28 AM End time: 07/07/2023 11:32 AM Staffing Performed: anesthesiologist  Anesthesiologist: Atilano Median, DO Performed by: Atilano Median, DO Authorized by: Atilano Median, DO   Preanesthetic Checklist Completed: patient identified, IV checked, site marked, risks and benefits discussed, surgical consent, monitors and equipment checked, pre-op evaluation and timeout performed Spinal Block Patient position: left lateral decubitus Prep: DuraPrep Patient monitoring: heart rate, cardiac monitor, continuous pulse ox and blood pressure Approach: midline Location: L3-4 Injection technique: single-shot Needle Needle type: Pencan  Needle gauge: 24 G Needle length: 10 cm Assessment Events: CSF return Additional Notes Patient identified. Risks/Benefits/Options discussed with patient including but not limited to bleeding, infection, nerve damage, paralysis, failed block, incomplete pain control, headache, blood pressure changes, nausea, vomiting, reactions to medications, itching and postpartum back pain. Confirmed with bedside nurse the patient's most recent platelet count. Confirmed with patient that they are not currently taking any anticoagulation, have any bleeding history or any family history of bleeding disorders. Patient expressed understanding and wished to proceed. All questions were answered. Sterile technique was used throughout the entire procedure. Please see nursing notes for vital signs. Warning signs of high block given to the patient including shortness of breath, tingling/numbness in hands, complete motor block, or any concerning symptoms with instructions to call for help. Patient was given instructions on fall risk and not to get out of bed. All questions and concerns addressed with instructions to call with any issues or inadequate analgesia.

## 2023-07-08 DIAGNOSIS — S72001S Fracture of unspecified part of neck of right femur, sequela: Secondary | ICD-10-CM | POA: Diagnosis not present

## 2023-07-08 LAB — CBC
HCT: 33.4 % — ABNORMAL LOW (ref 36.0–46.0)
Hemoglobin: 11 g/dL — ABNORMAL LOW (ref 12.0–15.0)
MCH: 30.7 pg (ref 26.0–34.0)
MCHC: 32.9 g/dL (ref 30.0–36.0)
MCV: 93.3 fL (ref 80.0–100.0)
Platelets: 174 10*3/uL (ref 150–400)
RBC: 3.58 MIL/uL — ABNORMAL LOW (ref 3.87–5.11)
RDW: 13.2 % (ref 11.5–15.5)
WBC: 16.1 10*3/uL — ABNORMAL HIGH (ref 4.0–10.5)
nRBC: 0 % (ref 0.0–0.2)

## 2023-07-08 LAB — URINALYSIS, ROUTINE W REFLEX MICROSCOPIC
Bilirubin Urine: NEGATIVE
Glucose, UA: NEGATIVE mg/dL
Ketones, ur: 5 mg/dL — AB
Leukocytes,Ua: NEGATIVE
Nitrite: NEGATIVE
Protein, ur: 30 mg/dL — AB
Specific Gravity, Urine: 1.023 (ref 1.005–1.030)
pH: 5 (ref 5.0–8.0)

## 2023-07-08 LAB — BASIC METABOLIC PANEL
Anion gap: 7 (ref 5–15)
BUN: 13 mg/dL (ref 8–23)
CO2: 26 mmol/L (ref 22–32)
Calcium: 9.1 mg/dL (ref 8.9–10.3)
Chloride: 104 mmol/L (ref 98–111)
Creatinine, Ser: 0.57 mg/dL (ref 0.44–1.00)
GFR, Estimated: >60 mL/min (ref >60)
Glucose, Bld: 127 mg/dL — ABNORMAL HIGH (ref 70–99)
Potassium: 3.9 mmol/L (ref 3.5–5.1)
Sodium: 137 mmol/L (ref 135–145)

## 2023-07-08 MED ORDER — ENOXAPARIN SODIUM 30 MG/0.3ML IJ SOSY
30.0000 mg | PREFILLED_SYRINGE | INTRAMUSCULAR | Status: DC
  Administered 2023-07-08 – 2023-07-10 (×3): 30 mg via SUBCUTANEOUS
  Filled 2023-07-08 (×3): qty 0.3

## 2023-07-08 MED ORDER — MORPHINE SULFATE (CONCENTRATE) 10 MG /0.5 ML PO SOLN
2.0000 mg | ORAL | Status: DC | PRN
  Administered 2023-07-08: 2 mg via ORAL
  Filled 2023-07-08: qty 0.5

## 2023-07-08 MED ORDER — ACETAMINOPHEN 10 MG/ML IV SOLN
1000.0000 mg | Freq: Once | INTRAVENOUS | Status: AC
  Administered 2023-07-08: 1000 mg via INTRAVENOUS
  Filled 2023-07-08: qty 100

## 2023-07-08 MED ORDER — KCL IN DEXTROSE-NACL 20-5-0.45 MEQ/L-%-% IV SOLN
INTRAVENOUS | Status: AC
  Filled 2023-07-08 (×2): qty 1000

## 2023-07-08 NOTE — Progress Notes (Signed)
PROGRESS NOTE    Tracy Malone  ZOX:[id number] DOB: [birth year] DOA: 07/05/2023 PCP: Joselyn Arrow, MD    Chief Complaint  Patient presents with   Fall    Brief Narrative:    Tracy Malone is a 89 y.o. female with medical history significant of carotid stenosis, fibromyalgia, hypothyroidism, and Parkinson's who presented on 12/12 with a fall.  She has a R humerus and R hip fracture.  Patient has been on home hospice for FTT.  She got up today and had a mechanical fall, landing on R side and injuring both her R arm and leg, she was admitted for further workup.  Patient is under hospice care, seen by orthopedic, upon discussion with family, plan was to proceed with surgery, that is post right femoral neck fracture percutaneous screw fixation by Dr. Christell Constant 07/07/2023.  Assessment & Plan:   Principal Problem:   Closed fracture of neck of right femur (HCC) Active Problems:   Closed fracture of right proximal forearm   Closed fracture of right proximal humerus   Closed nondisplaced fracture of base of neck of right femur (HCC)     Femoral neck fracture Apparently mechanical fall resulting in hip fracture Orthopedics consulted, they have discussed with the family, at this point daughter, son-in-law proceeded to proceed with surgery, -DVT prophylaxis per orthopedic, has aspirin allergy, recommendation for Xarelto, she will be on Lovenox during hospital stay, and Xarelto upon discharge -PT-OT consulted, going to be difficult specially with her humerus fracture -Status post right femoral neck fracture percutaneous screw fixation by Dr. Christell Constant 07/07/2023. -Multiple allergies, she is on IV fentanyl for pain, as well liquid morphine (has dysphagia   Humerus fracture Non-operative Will need sling and pain control This will complicate use of a walker regarding hip fracture   Parkinson's Continue Sinemet  Dysphagia -Failed bedside swallow x 2, plan for MBS tomorrow, meanwhile remains n.p.o.,  on IV fluids   Carotid stenosis No longer appears to be following with vascular   Fibromyalgia/IBS She "hurts all the time" Continue Bentyl prn   Hypothyroidism Continue Synthroid   Anxiety Continue alprazolam, fluoxetine   Leukocytosis -Most likely stress related, she is nontoxic-appearing, will check UA no acute findings on chest x-ray      DVT prophylaxis: Lovenox Code Status: DNR Family Communication: None at bedside Disposition:   Status is: Inpatient    Consultants:  Orthopedic   Subjective:  Patient failed her swallow evaluation  Objective: Vitals:   07/07/23 2124 07/07/23 2328 07/08/23 0437 07/08/23 0848  BP: (!) 118/50 (!) 118/56 (!) 107/55 (!) 124/57  Pulse:  69 77 81  Resp:  18 18 18   Temp:  97.6 F (36.4 C) 98 F (36.7 C) 98.5 F (36.9 C)  TempSrc:  Oral Oral Oral  SpO2:  95% 99% 100%  Weight:      Height:        Intake/Output Summary (Last 24 hours) at 07/08/2023 1406 Last data filed at 07/08/2023 1225 Gross per 24 hour  Intake --  Output 475 ml  Net -475 ml   Filed Weights   07/06/23 1329 07/07/23 1016  Weight: 37 kg 37 kg    Examination:  Awake Alert, x 2, pleasant, frail, deconditioned Symmetrical Chest wall movement, Good air movement bilaterally, CTAB RRR,No Gallops,Rubs or new Murmurs, No Parasternal Heave +ve B.Sounds, Abd Soft, No tenderness, No rebound - guarding or rigidity. No Cyanosis, right arm in a sling   Data Reviewed: I have personally reviewed following labs  and imaging studies  CBC: Recent Labs  Lab 07/05/23 2331 07/07/23 0452 07/08/23 0544  WBC 19.3* 25.6* 16.1*  HGB 11.6* 12.4 11.0*  HCT 34.8* 38.8 33.4*  MCV 94.3 96.0 93.3  PLT 196 200 174    Basic Metabolic Panel: Recent Labs  Lab 07/05/23 2331 07/07/23 0452 07/08/23 0544  NA 138 141 137  K 4.3 5.0 3.9  CL 102 105 104  CO2 25 21* 26  GLUCOSE 134* 79 127*  BUN 19 19 13   CREATININE 0.58 0.88 0.57  CALCIUM 9.3 9.5 9.1     GFR: Estimated Creatinine Clearance: 27.3 mL/min (by C-G formula based on SCr of 0.57 mg/dL).  Liver Function Tests: No results for input(s): "AST", "ALT", "ALKPHOS", "BILITOT", "PROT", "ALBUMIN" in the last 168 hours.  CBG: Recent Labs  Lab 07/07/23 0209  GLUCAP 94     No results found for this or any previous visit (from the past 240 hours).       Radiology Studies: DG Pelvis 1-2 Views Result Date: 07/07/2023 CLINICAL DATA:  Postoperative. Status post right proximal femoral ORIF. EXAM: PELVIS - 1-2 VIEW COMPARISON:  AP bilateral hips and lateral view of the right hip 07/05/2023 FINDINGS: There is diffuse decreased bone mineralization. Interval ORIF of the previously seen subcapital proximal femoral neck fracture with 3 screws. No hardware complication is seen. Mild bilateral superior femoroacetabular joint space narrowing and peripheral osteophytosis, similar to prior. Mild bilateral sacroiliac subchondral sclerosis. IMPRESSION: Interval ORIF of the previously seen subcapital proximal right femoral neck fracture. No hardware complication is seen. Electronically Signed   By: Neita Garnet M.D.   On: 07/07/2023 14:48   DG HIP UNILAT WITH PELVIS 2-3 VIEWS RIGHT Result Date: 07/07/2023 CLINICAL DATA:  Elective surgery. Narrowing of right femoral neck fracture. Intraoperative fluoroscopy. EXAM: DG HIP (WITH OR WITHOUT PELVIS) 2-3V RIGHT COMPARISON:  Right hip and femur radiographs 07/05/2023 FINDINGS: Images were performed intraoperatively without the presence of a radiologist. There are 3 new screws fixating the previously seen right subcapital acute fracture. No hardware complication is seen. Total fluoroscopy images: 3 Total fluoroscopy time: 101 seconds Total dose: Radiation Exposure Index (as provided by the fluoroscopic device): 5.77 mGy air Kerma Please see intraoperative findings for further detail. IMPRESSION: Intraoperative fluoroscopy for right femoral neck fracture fixation.  Electronically Signed   By: Neita Garnet M.D.   On: 07/07/2023 14:47   DG C-Arm 1-60 Min-No Report Result Date: 07/07/2023 Fluoroscopy was utilized by the requesting physician.  No radiographic interpretation.   DG C-Arm 1-60 Min-No Report Result Date: 07/07/2023 Fluoroscopy was utilized by the requesting physician.  No radiographic interpretation.        Scheduled Meds:  [START ON 07/09/2023] acidophilus  1 capsule Oral Once per day on Monday Thursday   Carbidopa-Levodopa ER  1 tablet Oral TID   feeding supplement  237 mL Oral BID BM   FLUoxetine  10 mg Oral Daily   levothyroxine  25 mcg Oral Q0600   polyethylene glycol  17 g Oral Daily   Continuous Infusions:  dextrose 5 % and 0.45 % NaCl with KCl 20 mEq/L       LOS: 2 days      Huey Bienenstock, MD [facility]   To contact the attending provider between 7A-7P or the covering provider during after hours 7P-7A, please log into the web site [email or web address] and access using universal [place] password for that web site. If you do not have the password, please call the hospital  operator.  07/08/2023, 2:06 PM

## 2023-07-08 NOTE — Progress Notes (Signed)
[name] 2R[facility] Patient Visit   [name] is a current hospice patient, with hospice diagnosis of Parkinson's unspecified and protein calorie malnutrition. She was admitted 07/05/23 with diagnosis of Right hip and Right humeral fracture. [facility] was notified by husband that patient had fallen and Hospice nurse was with patient when EMS arrived. Per Dr. Patric Dykes, hospice physician, this is a related hospital admission.    Visited patient at bedside. She was much more alert today.  She advised that she had just been helped back to bed after sitting in the chair for an hour and a half. She stated that she was not able to swallow well and reports pain score 5-6. Bedside nurse advised that pt will be having a swallow test tomorrow. Patient was conversational and in better spirits today. She is NPO awaiting swallow test. Tried reaching the daughter by phone but she was not available.    Patient is GIP appropriate due to need for IV pain medications and surgical stabilization of fracture.   Vital Signs: 98.5/81/18   124/57   100% on 2 lpm   I&O: not documented/225   Abnormal labs: Glu 127, WBC 16.1, RBC 3.58, Hgb 11.0, HCT 33.4   Diagnostics: none new  IV/PRN Meds: PO Morphine 2mg  x1, IV Ativan 0.5 mg x1, IV Fentanyl 25 mcg x1   Problem list per Elgergawy, Leana Roe, MD progress note dated 12.15.24  Assessment & Plan:   Principal Problem:   Closed fracture of neck of right femur (HCC) Active Problems:   Closed fracture of right proximal forearm   Closed fracture of right proximal humerus   Closed nondisplaced fracture of base of neck of right femur (HCC)   Femoral neck fracture Apparently mechanical fall resulting in hip fracture Orthopedics consulted, they have discussed with the family, at this point daughter, son-in-law proceeded to proceed with surgery, -DVT prophylaxis per orthopedic, has aspirin allergy, recommendation for  Xarelto, she will be on Lovenox during hospital stay, and Xarelto upon discharge -PT-OT consulted, going to be difficult specially with her humerus fracture -Status post right femoral neck fracture percutaneous screw fixation by Dr. Christell Constant 07/07/2023. -Multiple allergies, she is on IV fentanyl for pain, as well liquid morphine (has dysphagia   Humerus fracture Non-operative Will need sling and pain control This will complicate use of a walker regarding hip fracture   Parkinson's Continue Sinemet   Dysphagia -Failed bedside swallow x 2, plan for MBS tomorrow, meanwhile remains n.p.o., on IV fluids   Carotid stenosis No longer appears to be following with vascular   Fibromyalgia/IBS She "hurts all the time" Continue Bentyl prn   Hypothyroidism Continue Synthroid   Anxiety Continue alprazolam, fluoxetine   Leukocytosis -Most likely stress related, she is nontoxic-appearing, will check UA no acute findings on chest x-ray   Discharge Planning: Ongoing   Family Contact: left message for daughter  IDT: Updated   Goals of Care: DNR   [facility] hospital liaison [phone]

## 2023-07-08 NOTE — Evaluation (Signed)
Clinical/Bedside Swallow Evaluation Patient Details  Name: Tracy Malone MRN: [id number] Date of Birth: [birth year]  Today's Date: 07/08/2023 Time: SLP Start Time (ACUTE ONLY): 1222 SLP Stop Time (ACUTE ONLY): 1250 SLP Time Calculation (min) (ACUTE ONLY): 28 min  Past Medical History:  Past Medical History:  Diagnosis Date   Acute lower UTI 11/22/2018   Bell's palsy 1966   Hx: right side facial droop, resolved per patient 04/02/19   Carotid artery disease (HCC) 2010   on vascular screening;unchanged 2013.(could not tolerate simvastatin, no other statins tried)--<30% blockage bilat 07/2011   Claustrophobia    Depression    treated in the past for years;stopped in 2010 for a years   Duodenal ulcer 1962   h/o   Dysrhythmia    ocassional PVC's, no current problems per patient on 04/02/19   Fibromyalgia    chronic fatigue, chronic abdominal pain   Frequent PVCs 07/2012   Seen by [facility]: benign, asymptomatic, normal EF   Gallstones 02/2021   on [place]   GERD (gastroesophageal reflux disease)    diet controlled   Glaucoma, narrow-angle    s/p laser surgery   History of hiatal hernia    during endoscopy   Hypothyroid 03/2007   IBS (irritable bowel syndrome)    Dr. Elnoria Howard   Ischemic colitis (HCC) 11/21/2018   no current problems per patient on 04/02/19   Lichenoid keratosis 12/03/2020   Dr.Stinehelfer   Ocular migraine    Osteoporosis 04/2010   Dr.Hawkes; later consulted Dr. Sharl Ma (2022)   Panic attack    Parkinson's disease (HCC) 06/23/2016   Recurrent UTI    has cystocele-Dr.Grewal   Shingles 1999   h/o   Superficial thrombophlebitis 03/2009   RLE   Trochanteric bursitis 12/2008   bilateral   Past Surgical History:  Past Surgical History:  Procedure Laterality Date   ABDOMINAL HYSTERECTOMY     BACK SURGERY  05/2019   CATARACT EXTRACTION, BILATERAL  1995, 1996   EYE SURGERY Bilateral    laser - glaucoma   Flexible sigmoidoscopy     FRACTURE SURGERY  2020, Sept.  12th  (!)   KYPHOPLASTY N/A 04/03/2019   Procedure: KYPHOPLASTY L1;  Surgeon: Venita Lick, MD;  Location: [facility] OR;  Service: Orthopedics;  Laterality: N/A;  90 mins   RADIOFREQUENCY ABLATION Bilateral 11/14/2022   L3-4, L4-5 bilateral ([facility])   THYROIDECTOMY, PARTIAL  09/2005   L nodule; Dr. Gerrit Friends   TONSILLECTOMY  1946   UPPER GI ENDOSCOPY  06/27/2012   VAGINAL HYSTERECTOMY  1971   and bladder repair.  Still has ovaries   WISDOM TOOTH EXTRACTION     HPI:  Tracy Malone is a 89 y.o. female with medical history significant of carotid stenosis, fibromyalgia, hypothyroidism, and Parkinson's who presented on 12/12 with a fall.  She has a R humerus and R hip fracture.  Patient has been on home hospice for FTT.  She got up today and had a mechanical fall, landing on R side and injuring both her R arm and leg. Pt underwent surgery for right femoral neck fracture on 12/14.    Assessment / Plan / Recommendation  Clinical Impression  Pt was seen for a clinical swallow evaluation and she presents with suspected oropharyngeal dysphagia.  Pt reported that dysphagia occurred following surgery in addition to throat discomfort and vocal changes.  Of note, pt was not intubated for surgery yesterday (12/14).  Voice was noted to be hoarse with low vocal quality.  Pt reported that she consumes regular solids and thin liquids at baseline without apparent dysphagia.  Pt was seen with trials of ice chips, thin liquid, nectar-thick liquid, and puree.  She exhibited an immediate cough with thin liquid and an immediate throat clear with nectar-thick liquids.  Pt was noted to have significant discomfort and multiple swallows per bolus with independent oral suctioning with all trials except for ice chips.  Recommend continuation of NPO at this time with allowance for ice chips PRN for comfort with nursing supervision.  Pt would benefit from an instrumental swallow study to further evaluate swallow function.  SLP  Visit Diagnosis: Dysphagia, oropharyngeal phase (R13.12)    Aspiration Risk  Mild aspiration risk;Moderate aspiration risk    Diet Recommendation Ice chips PRN after oral care    Medication Administration: Via alternative means Compensations: Minimize environmental distractions;Slow rate;Small sips/bites    Other  Recommendations Oral Care Recommendations: Oral care QID;Oral care prior to ice chip/H20 Caregiver Recommendations: Have oral suction available    Recommendations for follow up therapy are one component of a multi-disciplinary discharge planning process, led by the attending physician.  Recommendations may be updated based on patient status, additional functional criteria and insurance authorization.  Follow up Recommendations Skilled nursing-short term rehab (<3 hours/day)      Assistance Recommended at Discharge    Functional Status Assessment Patient has had a recent decline in their functional status and demonstrates the ability to make significant improvements in function in a reasonable and predictable amount of time.  Frequency and Duration min 2x/week  2 weeks       Prognosis Prognosis for improved oropharyngeal function: Fair      Swallow Study   General HPI: Tracy Malone is a 89 y.o. female with medical history significant of carotid stenosis, fibromyalgia, hypothyroidism, and Parkinson's who presented on 12/12 with a fall.  She has a R humerus and R hip fracture.  Patient has been on home hospice for FTT.  She got up today and had a mechanical fall, landing on R side and injuring both her R arm and leg. Pt underwent surgery for right femoral neck fracture on 12/14. Type of Study: Bedside Swallow Evaluation Previous Swallow Assessment: N/A Diet Prior to this Study: NPO Temperature Spikes Noted: Yes Respiratory Status: Nasal cannula History of Recent Intubation: No Behavior/Cognition: Alert;Cooperative;Pleasant mood Oral Cavity Assessment: Within  Functional Limits Oral Care Completed by SLP: No Oral Cavity - Dentition: Adequate natural dentition Vision: Functional for self-feeding Self-Feeding Abilities: Needs assist Patient Positioning: Upright in chair Baseline Vocal Quality: Hoarse;Breathy;Low vocal intensity Volitional Cough: Weak Volitional Swallow: Able to elicit    Oral/Motor/Sensory Function Overall Oral Motor/Sensory Function: Within functional limits   Ice Chips Ice chips: Within functional limits Presentation: Spoon   Thin Liquid Thin Liquid: Impaired Presentation: Spoon Pharyngeal  Phase Impairments: Cough - Immediate;Multiple swallows    Nectar Thick Nectar Thick Liquid: Impaired Presentation: Cup Pharyngeal Phase Impairments: Throat Clearing - Immediate;Multiple swallows   Honey Thick Honey Thick Liquid: Not tested   Puree Puree: Impaired Presentation: Spoon Pharyngeal Phase Impairments: Multiple swallows   Solid     Solid: Not tested     Eino Farber, M.S., CCC-SLP Acute Rehabilitation Services Office: [phone]  Shanon Rosser Yeva Bissette 07/08/2023,1:10 PM

## 2023-07-08 NOTE — Plan of Care (Signed)
  Problem: Health Behavior/Discharge Planning: Goal: Ability to manage health-related needs will improve Outcome: Progressing   Problem: Clinical Measurements: Goal: Ability to maintain clinical measurements within normal limits will improve Outcome: Progressing Goal: Respiratory complications will improve Outcome: Progressing   Problem: Activity: Goal: Risk for activity intolerance will decrease Outcome: Progressing   Problem: Coping: Goal: Level of anxiety will decrease Outcome: Progressing   Problem: Pain Management: Goal: General experience of comfort will improve Outcome: Progressing   Problem: Skin Integrity: Goal: Risk for impaired skin integrity will decrease Outcome: Progressing

## 2023-07-08 NOTE — Evaluation (Signed)
Occupational Therapy Evaluation Patient Details Name: Tracy Malone MRN: [id number] DOB: [birth year] Today's Date: 07/08/2023   History of Present Illness 89 y.o. female admitted 07/05/23 after fall sustaining R femoral neck fx, R proximal humerus fx (non-operative management). S/p R femur fixation 12/14. Of note, pt on home hospice for failure to thrive. Other PMH includes Parkinsons, fibromyalgia, carotid stenosis, kyphoplasty.   Clinical Impression   PTA, pt was living at home with her husband and was performing basic ADLs and using cane for mobility. Both patient and her husband are on hospice care, and patient receives an aide assistance for bathing and IADLs. Pt currently requiring Mod-Max A for UB ADLs and Max A for LB ADLs. Performing functional transfers with Min-Mod A +2. Demonstrating high motivation to participate in therapy and return home. VSS throughout on RA. Pt will require 24/7 support and assistance for ADLs at dc. Recommending dc to post-acute rehab to optimize return to independence with ADLs. However, if able to gain 24/7 assistance, can dc to home with HHOT.        If plan is discharge home, recommend the following: A lot of help with walking and/or transfers;A lot of help with bathing/dressing/bathroom    Functional Status Assessment  Patient has had a recent decline in their functional status and demonstrates the ability to make significant improvements in function in a reasonable and predictable amount of time.  Equipment Recommendations  BSC/3in1    Recommendations for Other Services       Precautions / Restrictions Precautions Precautions: Fall Required Braces or Orthoses: Sling (Shoulder immobilizer) Restrictions Weight Bearing Restrictions Per Provider Order: Yes RUE Weight Bearing Per Provider Order: Non weight bearing RLE Weight Bearing Per Provider Order: Weight bearing as tolerated Other Position/Activity Restrictions: Maintain RUE in shoulder  immobilizer. Assuming no ROM of R hsoulder; will seek clarification      Mobility Bed Mobility Overal bed mobility: Needs Assistance Bed Mobility: Supine to Sit     Supine to sit: Max assist, +2 for physical assistance, +2 for safety/equipment, HOB elevated     General bed mobility comments: max A for managing LEs and elevating trunk. Limited by pain.    Transfers Overall transfer level: Needs assistance Equipment used: 1 person hand held assist Transfers: Sit to/from Stand, Bed to chair/wheelchair/BSC       Step pivot transfers: Min assist, +2 physical assistance     General transfer comment: Pt performing sit<>stand with Min A for power up and +2 for gaining balance. Pt holding to therapist with LUE for support. Taking steps towards recliner with Min A +2.      Balance Overall balance assessment: Needs assistance Sitting-balance support: Single extremity supported, Feet supported Sitting balance-Leahy Scale: Fair     Standing balance support: Single extremity supported, During functional activity Standing balance-Leahy Scale: Poor Standing balance comment: reliant on assistance                           ADL either performed or assessed with clinical judgement   ADL Overall ADL's : Needs assistance/impaired Eating/Feeding: Minimal assistance;Sitting   Grooming: Moderate assistance;Sitting   Upper Body Bathing: Moderate assistance;Sitting   Lower Body Bathing: Maximal assistance;Sit to/from stand   Upper Body Dressing : Maximal assistance;Sitting   Lower Body Dressing: Maximal assistance;Sit to/from stand   Toilet Transfer: Moderate assistance;Minimal assistance;+2 for safety/equipment;Stand-pivot (simulated to recliner)   Toileting- Clothing Manipulation and Hygiene: Maximal assistance  Functional mobility during ADLs: Minimal assistance;Moderate assistance;+2 for safety/equipment       Vision Baseline Vision/History: 1 Wears  glasses Patient Visual Report: No change from baseline (Glasses are at home)       Perception         Praxis         Pertinent Vitals/Pain Pain Assessment Pain Assessment: Faces Faces Pain Scale: Hurts whole lot Pain Location: RLE and R shoulder Pain Descriptors / Indicators: Grimacing, Guarding, Moaning Pain Intervention(s): Monitored during session, Repositioned, Patient requesting pain meds-RN notified     Extremity/Trunk Assessment Upper Extremity Assessment Upper Extremity Assessment: RUE deficits/detail;Right hand dominant RUE Deficits / Details: Proximal humerus fx RUE: Unable to fully assess due to immobilization RUE Coordination: decreased fine motor;decreased gross motor   Lower Extremity Assessment Lower Extremity Assessment: Defer to PT evaluation   Cervical / Trunk Assessment Cervical / Trunk Assessment: Kyphotic   Communication Communication Communication: No apparent difficulties   Cognition Arousal: Alert Behavior During Therapy: WFL for tasks assessed/performed Overall Cognitive Status: Within Functional Limits for tasks assessed                                 General Comments: Pt following conversation and aware of her medical situation. Following commands and demonstrating good problem solving.     General Comments  VSS on RA    Exercises     Shoulder Instructions      Home Living Family/patient expects to be discharged to:: Private residence Living Arrangements: Spouse/significant other Available Help at Discharge: Family;Available 24 hours/day (Hospice) Type of Home: House Home Access: Level entry     Home Layout: Two level;Able to live on main level with bedroom/bathroom     Bathroom Shower/Tub: Producer, television/film/video: Standard     Home Equipment: Production assistant, radio - single point   Additional Comments: Daughter visits everyone and awhile      Prior Functioning/Environment Prior Level of Function :  Needs assist             Mobility Comments: Using a cane for functional mobiility ADLs Comments: Both patient and husband are on hospice and have aides who come in to assist with IADLs. Aide also assist with bathing. Comes once a day for "as many hours as needed".        OT Problem List: Decreased strength;Decreased activity tolerance;Decreased range of motion;Impaired balance (sitting and/or standing);Decreased knowledge of use of DME or AE;Decreased knowledge of precautions      OT Treatment/Interventions: Self-care/ADL training;Therapeutic exercise;Energy conservation;DME and/or AE instruction;Therapeutic activities;Patient/family education    OT Goals(Current goals can be found in the care plan section) Acute Rehab OT Goals Patient Stated Goal: Get stronger and go home OT Goal Formulation: With patient Time For Goal Achievement: 07/22/23 Potential to Achieve Goals: Good  OT Frequency: Min 1X/week    Co-evaluation PT/OT/SLP Co-Evaluation/Treatment: Yes Reason for Co-Treatment: For patient/therapist safety;To address functional/ADL transfers PT goals addressed during session: Balance;Mobility/safety with mobility OT goals addressed during session: ADL's and self-care      AM-PAC OT "6 Clicks" Daily Activity     Outcome Measure Help from another person eating meals?: A Little Help from another person taking care of personal grooming?: A Little Help from another person toileting, which includes using toliet, bedpan, or urinal?: A Lot Help from another person bathing (including washing, rinsing, drying)?: A Lot Help from another person to put on and  taking off regular upper body clothing?: A Lot Help from another person to put on and taking off regular lower body clothing?: A Lot 6 Click Score: 14   End of Session Equipment Utilized During Treatment: Gait belt;Other (comment) (sling) Nurse Communication: Mobility status;Precautions;Weight bearing status (red spot on thorasic  spine with potential for wound.)  Activity Tolerance: Patient tolerated treatment well Patient left: in chair;with call bell/phone within reach;with chair alarm set  OT Visit Diagnosis: Unsteadiness on feet (R26.81);Other abnormalities of gait and mobility (R26.89);Muscle weakness (generalized) (M62.81);Pain Pain - Right/Left: Right Pain - part of body: Leg;Shoulder                Time: 2956-2130 OT Time Calculation (min): 24 min Charges:  OT General Charges $OT Visit: 1 Visit OT Evaluation $OT Eval Moderate Complexity: 1 Mod  Elroy Schembri MSOT, OTR/L Acute Rehab Office: [phone]  Theodoro Grist Neyland Pettengill 07/08/2023, 1:16 PM

## 2023-07-08 NOTE — Progress Notes (Signed)
Orthopedic Surgery Progress Note   Assessment: Patient is a 89 y.o. female with right femoral neck fracture, right proximal humerus fracture   Plan: -Operative plans: complete -Diet: regular -DVT ppx: xarelto since she has ASA allergy -Antibiotics: clindamycin x2 post-op doses -Weight bearing status: WBAT RLE, NWB RUE in sling -PT evaluate and treat -Pain control -Dispo: per primary  ___________________________________________________________________________  Subjective: No acute events overnight. Was able to get some sleep last night. Having some pain in her hip and shoulder. No other extremity pain. No other complaints this morning.    Physical Exam:  General: no acute distress, appears stated age Neurologic: sleeping but awakes to voice, answering questions appropriately, following commands Respiratory: unlabored breathing on room air, symmetric chest rise  MSK:   -Right upper extremity Sling in place  AIN/PIN/IO intact  Palpable radial pulse  Sensation intact to light touch in median/ulnar/radial/axillary nerve distributions  Hand warm and well perfused  -Right lower extremity  Dressing over hip c/d/i EHL/TA/GSC intact Plantarflexes and dorsiflexes toes Sensation intact to light touch in sural, saphenous, tibial, deep peroneal, and superficial peroneal nerve distributions Foot warm and well perfused   Patient name: Tracy Malone Patient MRN: [id number] Date: 07/08/23

## 2023-07-08 NOTE — Evaluation (Signed)
Physical Therapy Evaluation Patient Details Name: Tracy Malone MRN: [id number] DOB: [birth year] Today's Date: 07/08/2023  History of Present Illness  89 y.o. female admitted 07/05/23 after fall sustaining R femoral neck fx, R proximal humerus fx (non-operative management). S/p R femur fixation 12/14. Of note, pt on home hospice for failure to thrive. Other PMH includes Parkinsons, fibromyalgia, carotid stenosis, kyphoplasty.   Clinical Impression  Pt presents with an overall decrease in functional mobility secondary to above. PTA, pt mod indep with SPC, lives with husband, has daily PCA assist for ADL/iADLs as needed; pt and husband both on home hospice. Today, pt requiring min-maxA+2 for mobility, able to take steps to recliner. Pt limited by generalized weakness, pain, decreased activity tolerance and impaired balance strategies. Pt would benefit from post-acute rehab services (< 3 hrs/day) to maximize functional mobility and independence prior to d/c home. If pt to return home with hospice, will need 24/7 physical assist    If plan is discharge home, recommend the following: A lot of help with walking and/or transfers;A lot of help with bathing/dressing/bathroom;Assistance with cooking/housework;Assist for transportation;Help with stairs or ramp for entrance   Can travel by private vehicle    No    Equipment Recommendations  (if d/c home - BSC/3in1, wheelchair)  Recommendations for Other Services       Functional Status Assessment Patient has had a recent decline in their functional status and demonstrates the ability to make significant improvements in function in a reasonable and predictable amount of time.     Precautions / Restrictions Precautions Precautions: Fall Required Braces or Orthoses: Sling (Shoulder immobilizer) Restrictions Weight Bearing Restrictions Per Provider Order: Yes RUE Weight Bearing Per Provider Order: Non weight bearing RLE Weight Bearing Per Provider  Order: Weight bearing as tolerated Other Position/Activity Restrictions: Maintain RUE in shoulder immobilizer. Assuming no ROM of R hsoulder; will seek clarification      Mobility  Bed Mobility Overal bed mobility: Needs Assistance Bed Mobility: Supine to Sit     Supine to sit: Max assist, +2 for physical assistance, +2 for safety/equipment, HOB elevated     General bed mobility comments: max A for managing LEs and elevating trunk. Limited by pain.    Transfers Overall transfer level: Needs assistance Equipment used: 1 person hand held assist Transfers: Sit to/from Stand, Bed to chair/wheelchair/BSC Sit to Stand: Mod assist   Step pivot transfers: Min assist, +2 physical assistance       General transfer comment: Pt performing sit<>stand with Min A for power up and +2 for gaining balance. Pt holding to therapist with LUE for support. Taking steps towards recliner with Min A +2.    Ambulation/Gait                  Stairs            Wheelchair Mobility     Tilt Bed    Modified Rankin (Stroke Patients Only)       Balance Overall balance assessment: Needs assistance Sitting-balance support: Single extremity supported, Feet supported, No upper extremity supported Sitting balance-Leahy Scale: Fair     Standing balance support: Single extremity supported, During functional activity Standing balance-Leahy Scale: Poor                               Pertinent Vitals/Pain Pain Assessment Pain Assessment: Faces Faces Pain Scale: Hurts whole lot Pain Location: RUE, RLE, headache Pain Descriptors / Indicators:  Discomfort, Grimacing, Guarding Pain Intervention(s): Patient requesting pain meds-RN notified, Limited activity within patient's tolerance, Repositioned    Home Living Family/patient expects to be discharged to:: Private residence Living Arrangements: Spouse/significant other Available Help at Discharge: Family;Available 24 hours/day  (Hospice) Type of Home: House Home Access: Level entry       Home Layout: Two level;Able to live on main level with bedroom/bathroom Home Equipment: Shower seat;Cane - single point Additional Comments: Daughter visits everyone and awhile. Hospice comes 1x/wk for medical checks    Prior Function Prior Level of Function : Needs assist             Mobility Comments: mod indep ambulating with SPC; reports she does not leave house much ADLs Comments: Both patient and husband are on hospice and have aides who come in to assist with IADLs. Aide also assist with bathing. Comes once a day for "as many hours as needed".     Extremity/Trunk Assessment   Upper Extremity Assessment Upper Extremity Assessment: Right hand dominant;RUE deficits/detail;Generalized weakness RUE Deficits / Details: Proximal humerus fx RUE: Unable to fully assess due to immobilization RUE Coordination: decreased fine motor;decreased gross motor    Lower Extremity Assessment Lower Extremity Assessment: Generalized weakness;RLE deficits/detail RLE Deficits / Details: s/p R femur fixation with expected post-op pain and weakness; gross strength 3/5 knee, </ 3/5 hip    Cervical / Trunk Assessment Cervical / Trunk Assessment: Kyphotic  Communication   Communication Communication: No apparent difficulties  Cognition                                                General Comments General comments (skin integrity, edema, etc.): SpO2 99% on RA. initiated educ re: POC, precautions, activity recommendations, importance of OOB mobility, discharge needs. noted wound on mid-back spine bony prominence with skin breakdown, RN looked at during session and covered with dressing    Exercises     Assessment/Plan    PT Assessment Patient needs continued PT services  PT Problem List Decreased strength;Decreased range of motion;Decreased activity tolerance;Decreased balance;Decreased mobility;Decreased  knowledge of use of DME;Decreased knowledge of precautions;Pain       PT Treatment Interventions DME instruction;Gait training;Stair training;Functional mobility training;Therapeutic activities;Therapeutic exercise;Balance training;Patient/family education    PT Goals (Current goals can be found in the Care Plan section)  Acute Rehab PT Goals Patient Stated Goal: decreased pain, return home PT Goal Formulation: With patient Time For Goal Achievement: 07/22/23 Potential to Achieve Goals: Fair    Frequency Min 1X/week     Co-evaluation PT/OT/SLP Co-Evaluation/Treatment: Yes Reason for Co-Treatment: For patient/therapist safety;To address functional/ADL transfers PT goals addressed during session: Balance;Mobility/safety with mobility OT goals addressed during session: ADL's and self-care       AM-PAC PT "6 Clicks" Mobility  Outcome Measure Help needed turning from your back to your side while in a flat bed without using bedrails?: A Lot Help needed moving from lying on your back to sitting on the side of a flat bed without using bedrails?: A Lot Help needed moving to and from a bed to a chair (including a wheelchair)?: A Lot Help needed standing up from a chair using your arms (e.g., wheelchair or bedside chair)?: A Lot Help needed to walk in hospital room?: Total Help needed climbing 3-5 steps with a railing? : Total 6 Click Score: 10  End of Session Equipment Utilized During Treatment: Gait belt Activity Tolerance: Patient tolerated treatment well;Patient limited by pain Patient left: in chair;with call bell/phone within reach;with chair alarm set Nurse Communication: Mobility status PT Visit Diagnosis: Other abnormalities of gait and mobility (R26.89);Muscle weakness (generalized) (M62.81)    Time: 1610-9604 PT Time Calculation (min) (ACUTE ONLY): 27 min   Charges:   PT Evaluation $PT Eval Moderate Complexity: 1 Mod   PT General Charges $$ ACUTE PT VISIT: 1  Visit       Ina Homes, PT, DPT Acute Rehabilitation Services  Personal: Secure Chat Rehab Office: [phone]  Malachy Chamber 07/08/2023, 1:24 PM

## 2023-07-08 NOTE — Progress Notes (Signed)
Initial Nutrition Assessment  DOCUMENTATION CODES:   Underweight  INTERVENTION:  Advance diet as medically applicable. When pt no longer NPO Ensure Plus High Protein po TID, each supplement provides 350 kcal and 20 grams of protein.  When patient no longer NPO add Multivitamin with minerals  NUTRITION DIAGNOSIS:   Increased nutrient needs related to hip fracture as evidenced by estimated needs.    GOAL:   Patient will meet greater than or equal to 90% of their needs    MONITOR:   PO intake, Supplement acceptance, Diet advancement  REASON FOR ASSESSMENT:   Consult Hip fracture protocol, Assessment of nutrition requirement/status  ASSESSMENT:  89 y.o. F presented from home where she lives with husband due to a fall on 12/12, resulting in R humerus and R hip fracture. Currently on Home hospice for FTT.  PMH: Depression, panic attack, fibromyalgia, IBS, CAD, recurrent UTI, Ocular migraine, GERD, Bell's palsy, Parkinson's.  Admit weight: 37 kg Current weight: 37 kg  Weight history; 07/07/23 37 kg  06/20/23 37.7 kg  06/08/23 37.1 kg  05/29/23 37.3 kg  05/11/23 36.3 kg  04/19/23 37.5 kg  04/11/23 37.9 kg  03/19/23 38.7 kg  02/26/23 39.9 kg  01/04/23 40 kg       Average Meal Intake: Currently NPO  Nutritionally Relevant Medications: Scheduled Meds:  [START ON 07/09/2023] acidophilus  1 capsule Oral Once per day on Monday Thursday   Carbidopa-Levodopa ER  1 tablet Oral TID   feeding supplement  237 mL Oral BID BM   levothyroxine  25 mcg Oral Q0600    PRN Meds:.ALPRAZolam, dicyclomine, fentaNYL (SUBLIMAZE) injection, LORazepam, methocarbamol **OR** methocarbamol (ROBAXIN) injection  Labs reviewed    NUTRITION - FOCUSED PHYSICAL EXAM:  Deferred   Diet Order:   Diet Order             Diet NPO time specified Except for: Sips with Meds  Diet effective midnight                   EDUCATION NEEDS:   Not appropriate for education at this  time  Skin:  Skin Assessment: Skin Integrity Issues: Skin Integrity Issues:: Incisions Incisions: Right hip  Last BM:  PTA  Height:   Ht Readings from Last 1 Encounters:  07/07/23 4' 7.98" (1.422 m)    Weight:   Wt Readings from Last 1 Encounters:  07/07/23 37 kg    Ideal Body Weight:     BMI:  Body mass index is 18.3 kg/m.  Estimated Nutritional Needs:   Kcal:  1305-1500 kcal/d  Protein:  60-75 g/d  Fluid:  >/= 1.5 L/D    Jamelle Haring RDN, LDN Clinical Dietitian  Pleas see Amion for contact information

## 2023-07-09 ENCOUNTER — Inpatient Hospital Stay (HOSPITAL_COMMUNITY): Payer: Medicare Other

## 2023-07-09 ENCOUNTER — Telehealth: Payer: Self-pay | Admitting: Orthopedic Surgery

## 2023-07-09 ENCOUNTER — Encounter (HOSPITAL_COMMUNITY): Payer: Self-pay | Admitting: Orthopedic Surgery

## 2023-07-09 DIAGNOSIS — Z881 Allergy status to other antibiotic agents status: Secondary | ICD-10-CM | POA: Diagnosis not present

## 2023-07-09 DIAGNOSIS — Y9301 Activity, walking, marching and hiking: Secondary | ICD-10-CM | POA: Diagnosis present

## 2023-07-09 DIAGNOSIS — Z515 Encounter for palliative care: Secondary | ICD-10-CM | POA: Diagnosis not present

## 2023-07-09 DIAGNOSIS — Z888 Allergy status to other drugs, medicaments and biological substances status: Secondary | ICD-10-CM | POA: Diagnosis not present

## 2023-07-09 DIAGNOSIS — Z886 Allergy status to analgesic agent status: Secondary | ICD-10-CM | POA: Diagnosis not present

## 2023-07-09 DIAGNOSIS — S72001S Fracture of unspecified part of neck of right femur, sequela: Secondary | ICD-10-CM | POA: Diagnosis not present

## 2023-07-09 DIAGNOSIS — E039 Hypothyroidism, unspecified: Secondary | ICD-10-CM | POA: Diagnosis present

## 2023-07-09 DIAGNOSIS — F411 Generalized anxiety disorder: Secondary | ICD-10-CM | POA: Diagnosis not present

## 2023-07-09 DIAGNOSIS — Z91041 Radiographic dye allergy status: Secondary | ICD-10-CM | POA: Diagnosis not present

## 2023-07-09 DIAGNOSIS — W010XXA Fall on same level from slipping, tripping and stumbling without subsequent striking against object, initial encounter: Secondary | ICD-10-CM | POA: Diagnosis present

## 2023-07-09 DIAGNOSIS — R1312 Dysphagia, oropharyngeal phase: Secondary | ICD-10-CM | POA: Diagnosis present

## 2023-07-09 DIAGNOSIS — I739 Peripheral vascular disease, unspecified: Secondary | ICD-10-CM | POA: Diagnosis present

## 2023-07-09 DIAGNOSIS — W19XXXA Unspecified fall, initial encounter: Secondary | ICD-10-CM | POA: Diagnosis not present

## 2023-07-09 DIAGNOSIS — Z66 Do not resuscitate: Secondary | ICD-10-CM | POA: Diagnosis present

## 2023-07-09 DIAGNOSIS — Z681 Body mass index (BMI) 19 or less, adult: Secondary | ICD-10-CM | POA: Diagnosis not present

## 2023-07-09 DIAGNOSIS — H4020X0 Unspecified primary angle-closure glaucoma, stage unspecified: Secondary | ICD-10-CM | POA: Diagnosis present

## 2023-07-09 DIAGNOSIS — D72829 Elevated white blood cell count, unspecified: Secondary | ICD-10-CM | POA: Diagnosis not present

## 2023-07-09 DIAGNOSIS — S72044A Nondisplaced fracture of base of neck of right femur, initial encounter for closed fracture: Secondary | ICD-10-CM | POA: Diagnosis present

## 2023-07-09 DIAGNOSIS — Z8249 Family history of ischemic heart disease and other diseases of the circulatory system: Secondary | ICD-10-CM | POA: Diagnosis not present

## 2023-07-09 DIAGNOSIS — E440 Moderate protein-calorie malnutrition: Secondary | ICD-10-CM | POA: Diagnosis present

## 2023-07-09 DIAGNOSIS — R627 Adult failure to thrive: Secondary | ICD-10-CM | POA: Diagnosis present

## 2023-07-09 DIAGNOSIS — K589 Irritable bowel syndrome without diarrhea: Secondary | ICD-10-CM | POA: Diagnosis present

## 2023-07-09 DIAGNOSIS — M810 Age-related osteoporosis without current pathological fracture: Secondary | ICD-10-CM | POA: Diagnosis present

## 2023-07-09 DIAGNOSIS — I6529 Occlusion and stenosis of unspecified carotid artery: Secondary | ICD-10-CM | POA: Diagnosis present

## 2023-07-09 DIAGNOSIS — H9313 Tinnitus, bilateral: Secondary | ICD-10-CM | POA: Diagnosis not present

## 2023-07-09 DIAGNOSIS — Z79890 Hormone replacement therapy (postmenopausal): Secondary | ICD-10-CM | POA: Diagnosis not present

## 2023-07-09 DIAGNOSIS — G20A1 Parkinson's disease without dyskinesia, without mention of fluctuations: Secondary | ICD-10-CM | POA: Diagnosis present

## 2023-07-09 DIAGNOSIS — M797 Fibromyalgia: Secondary | ICD-10-CM | POA: Diagnosis present

## 2023-07-09 DIAGNOSIS — S42201A Unspecified fracture of upper end of right humerus, initial encounter for closed fracture: Secondary | ICD-10-CM | POA: Diagnosis not present

## 2023-07-09 DIAGNOSIS — S5291XA Unspecified fracture of right forearm, initial encounter for closed fracture: Secondary | ICD-10-CM | POA: Diagnosis present

## 2023-07-09 LAB — CBC
HCT: 38.9 % (ref 36.0–46.0)
Hemoglobin: 12.2 g/dL (ref 12.0–15.0)
MCH: 30.7 pg (ref 26.0–34.0)
MCHC: 31.4 g/dL (ref 30.0–36.0)
MCV: 97.7 fL (ref 80.0–100.0)
Platelets: 141 10*3/uL — ABNORMAL LOW (ref 150–400)
RBC: 3.98 MIL/uL (ref 3.87–5.11)
RDW: 13.2 % (ref 11.5–15.5)
WBC: 14.5 10*3/uL — ABNORMAL HIGH (ref 4.0–10.5)
nRBC: 0 % (ref 0.0–0.2)

## 2023-07-09 LAB — BASIC METABOLIC PANEL
Anion gap: 6 (ref 5–15)
BUN: 8 mg/dL (ref 8–23)
CO2: 23 mmol/L (ref 22–32)
Calcium: 8.2 mg/dL — ABNORMAL LOW (ref 8.9–10.3)
Chloride: 108 mmol/L (ref 98–111)
Creatinine, Ser: 0.57 mg/dL (ref 0.44–1.00)
GFR, Estimated: >60 mL/min (ref >60)
Glucose, Bld: 98 mg/dL (ref 70–99)
Potassium: 3.3 mmol/L — ABNORMAL LOW (ref 3.5–5.1)
Sodium: 137 mmol/L (ref 135–145)

## 2023-07-09 MED ORDER — OXYCODONE HCL 5 MG PO TABS: 2.5000 mg | ORAL_TABLET | Freq: Four times a day (QID) | ORAL | Status: DC | PRN

## 2023-07-09 MED ORDER — ACETAMINOPHEN 325 MG PO TABS
650.0000 mg | ORAL_TABLET | Freq: Four times a day (QID) | ORAL | Status: DC | PRN
  Administered 2023-07-09: 650 mg via ORAL

## 2023-07-09 MED ORDER — LACTATED RINGERS IV SOLN: INTRAVENOUS | Status: AC

## 2023-07-09 MED ORDER — ADULT MULTIVITAMIN W/MINERALS CH
1.0000 | ORAL_TABLET | Freq: Every day | ORAL | Status: DC
  Administered 2023-07-10: 1 via ORAL
  Filled 2023-07-09 (×2): qty 1

## 2023-07-09 NOTE — Progress Notes (Signed)
[facility] Liaison Note  This patient has elected to revoke the Hospice Medicare Benefit to pursue rehab post R humerus fracture and R hip fracture.  Paperwork completed and outpatient palliative referral has been placed to follow patient at the facility.  Please call with any questions.  Thank you, Haynes Bast, BSN, [facility] [phone]

## 2023-07-09 NOTE — Progress Notes (Addendum)
  Patient reported that she is not eating much and feels like she is choking while swallowing her food.  -Patient is refusing oral food.  Continue food as patient tolerates.  Patient already has been evaluated by SLP recommended regular diet and mildly thick liquid.  Crushed medication. - Will start some maintenance fluid.

## 2023-07-09 NOTE — Telephone Encounter (Signed)
Tracy Malone (OT) called from [facility]. Tracy Malone is asking if any resrictions on right shoulder  and also can pt come out of sling for daily bathing and dressing. Tracy Malone secure number is [phone].

## 2023-07-09 NOTE — Telephone Encounter (Signed)
I called and advised. 

## 2023-07-09 NOTE — Progress Notes (Signed)
Physical Therapy Treatment Patient Details Name: Tracy Malone MRN: [id number] DOB: [birth year] Today's Date: 07/09/2023   History of Present Illness 89 y.o. female admitted 07/05/23 after fall sustaining R femoral neck fx, R proximal humerus fx (non-operative management). S/p R femur fixation 12/14. Of note, pt on home hospice for failure to thrive. Other PMH includes Parkinsons, fibromyalgia, carotid stenosis, kyphoplasty.    PT Comments  Pt received in supine and agreeable to session. Pt continues to require increased assist for bed mobility due to LUE precautions and R hip pain. Pt able to tolerate gait trial with L HHA and min A+2. Pt is limited by R hip pain and decreased WB tolerance, however demonstrates improved stability this session. Pt continues to benefit from PT services to progress toward functional mobility goals.     If plan is discharge home, recommend the following: A lot of help with walking and/or transfers;A lot of help with bathing/dressing/bathroom;Assistance with cooking/housework;Assist for transportation;Help with stairs or ramp for entrance   Can travel by private vehicle        Equipment Recommendations       Recommendations for Other Services       Precautions / Restrictions Precautions Precautions: Fall Required Braces or Orthoses: Sling Restrictions Weight Bearing Restrictions Per Provider Order: Yes RUE Weight Bearing Per Provider Order: Non weight bearing RLE Weight Bearing Per Provider Order: Weight bearing as tolerated Other Position/Activity Restrictions: Maintain RUE in shoulder immobilizer. Assuming no ROM of R shoulder; will seek clarification     Mobility  Bed Mobility Overal bed mobility: Needs Assistance Bed Mobility: Supine to Sit     Supine to sit: Max assist, +2 for physical assistance, +2 for safety/equipment, HOB elevated     General bed mobility comments: Max A for managing hips; pt able to assist in pushing into upright  posture with LUE and advancing BLE to EOB    Transfers Overall transfer level: Needs assistance Equipment used: 1 person hand held assist Transfers: Sit to/from Stand, Bed to chair/wheelchair/BSC Sit to Stand: Mod assist, +2 physical assistance   Step pivot transfers: Min assist, +2 physical assistance       General transfer comment: Pt performing sit<>stand with Mod A +2 from EOB and taking steps to reclienr. Pt then completing second sit<>stand with Min A +2.    Ambulation/Gait Ambulation/Gait assistance: +2 safety/equipment, Min assist Gait Distance (Feet): 15 Feet Assistive device: 1 person hand held assist Gait Pattern/deviations: Shuffle, Decreased stride length, Step-to pattern, Antalgic, Decreased step length - left       General Gait Details: Pt demonstrates very slow, short steps with slight improvement with cues for increased step length. Min A+2 for balance and L HHA. chair follow for safety.      Balance Overall balance assessment: Needs assistance Sitting-balance support: Single extremity supported, Feet supported, No upper extremity supported Sitting balance-Leahy Scale: Fair Sitting balance - Comments: sitting EOB   Standing balance support: Single extremity supported, During functional activity Standing balance-Leahy Scale: Poor Standing balance comment: with L HHA                            Cognition Arousal: Alert Behavior During Therapy: WFL for tasks assessed/performed Overall Cognitive Status: Within Functional Limits for tasks assessed  Exercises      General Comments General comments (skin integrity, edema, etc.): Daughter present and supportive throughout      Pertinent Vitals/Pain Pain Assessment Pain Assessment: Faces Faces Pain Scale: Hurts even more Pain Location: RLE Pain Descriptors / Indicators: Grimacing, Discomfort Pain Intervention(s): Monitored during  session, Limited activity within patient's tolerance, Repositioned     PT Goals (current goals can now be found in the care plan section) Acute Rehab PT Goals Patient Stated Goal: decreased pain, return home PT Goal Formulation: With patient Time For Goal Achievement: 07/22/23 Progress towards PT goals: Progressing toward goals    Frequency    Min 1X/week           Co-evaluation PT/OT/SLP Co-Evaluation/Treatment: Yes Reason for Co-Treatment: For patient/therapist safety;To address functional/ADL transfers PT goals addressed during session: Balance;Mobility/safety with mobility OT goals addressed during session: ADL's and self-care      AM-PAC PT "6 Clicks" Mobility   Outcome Measure  Help needed turning from your back to your side while in a flat bed without using bedrails?: A Lot Help needed moving from lying on your back to sitting on the side of a flat bed without using bedrails?: A Lot Help needed moving to and from a bed to a chair (including a wheelchair)?: A Lot Help needed standing up from a chair using your arms (e.g., wheelchair or bedside chair)?: A Lot Help needed to walk in hospital room?: Total Help needed climbing 3-5 steps with a railing? : Total 6 Click Score: 10    End of Session Equipment Utilized During Treatment: Gait belt Activity Tolerance: Patient tolerated treatment well;Patient limited by pain Patient left: in chair;with call bell/phone within reach;with chair alarm set;with family/visitor present Nurse Communication: Mobility status PT Visit Diagnosis: Other abnormalities of gait and mobility (R26.89);Muscle weakness (generalized) (M62.81)     Time: 1308-6578 PT Time Calculation (min) (ACUTE ONLY): 29 min  Charges:    $Gait Training: 8-22 mins PT General Charges $$ ACUTE PT VISIT: 1 Visit                     Johny Shock, PTA Acute Rehabilitation Services Secure Chat Preferred  [phone]    Johny Shock 07/09/2023, 1:19 PM

## 2023-07-09 NOTE — [place] FL2 (Addendum)
[place] MEDICAID [id number] LEVEL OF CARE FORM     IDENTIFICATION  Patient Name: Tracy Malone Birthdate: [birth year] Sex: female Admission Date (Current Location): 07/05/2023  [facility] and [place] Number:  Producer, television/film/video and Address:  [facility], [address], [place], [place] [address]      Provider Number: [id number]  Attending Physician Name and Address:  Elgergawy, Leana Roe, MD  Relative Name and Phone Number:       Current Level of Care: Hospital Recommended Level of Care: Skilled Nursing Facility Prior Approval Number:    Date Approved/Denied:   PASRR Number: [id number] E expires 08/08/23  Discharge Plan: Home    Current Diagnoses: Patient Active Problem List   Diagnosis Date Noted   Closed nondisplaced fracture of base of neck of right femur (HCC) 07/07/2023   Closed fracture of neck of right femur (HCC) 07/06/2023   Closed fracture of right proximal humerus 07/06/2023   Closed fracture of right proximal forearm 07/05/2023   Protein-calorie malnutrition (HCC) 03/19/2023   Allergic conjunctivitis of left eye 04/09/2022   Dry eye of left side 04/09/2022   Chronic left-sided low back pain with left-sided sciatica 01/26/2022   Senile purpura (HCC) 11/04/2021   Calculus of gallbladder without cholecystitis without obstruction 11/01/2021   Rash 03/18/2021   Herpes zoster without complication 03/18/2021   Multiple allergies 03/18/2021   Frail elderly 03/18/2021   LUQ pain 10/29/2019   Poor appetite 10/29/2019   Loss of weight 10/29/2019   Nausea and vomiting 10/29/2019   Altered bowel habits 10/29/2019   Lumbar compression fracture (HCC) 04/03/2019   Compression fracture of L1 lumbar vertebra (HCC) 03/16/2019   Osteoporosis of multiple sites 03/16/2019   Essential tremor 02/06/2019   Ischemic colitis (HCC)    Leukocytosis    Acute colitis 11/22/2018   Colitis 11/22/2018   Cystitis    Parkinson's disease (HCC) 10/24/2018   Gait  disturbance 11/28/2017   Chronic lymphocytic thyroiditis 04/24/2017   Status post removal of thyroid nodule 04/24/2017   Depression with anxiety 03/16/2017   Subcutaneous nodules 03/16/2017   Aortic atherosclerosis (HCC) 11/24/2016   Allergy to multiple antibiotics 10/07/2016   Seafood allergy, anaphylaxis, subsequent encounter 10/07/2016   Helicobacter pylori gastritis 10/07/2016   Fall 09/05/2016   Bloating 08/15/2016   Severe recurrent major depression without psychotic features (HCC) 04/13/2016   Rash and nonspecific skin eruption 03/01/2016   Leg pain, bilateral 03/01/2016   Functional dyspepsia 10/13/2015   Subacromial bursitis 03/02/2015   Resting tremor 02/01/2015   Epigastric fullness 10/12/2014   Early satiety 10/12/2014   Cystocele 12/30/2013   IBS (irritable bowel syndrome) 09/30/2013   GERD (gastroesophageal reflux disease) 06/27/2013   PVC's (premature ventricular contractions) 10/02/2012   Depressive disorder, not elsewhere classified 10/02/2012   Bradycardia 08/01/2012   Fatigue 08/01/2012   Anxiety state 05/13/2012   Osteopenia 05/13/2012   Hypothyroidism 05/13/2012    Orientation RESPIRATION BLADDER Height & Weight     Self, Situation, Time, Place  Normal Incontinent, External catheter Weight: 81 lb 9.1 oz (37 kg) Height:  4' 7.98" (142.2 cm)  BEHAVIORAL SYMPTOMS/MOOD NEUROLOGICAL BOWEL NUTRITION STATUS      Continent Diet (See dc summary)  AMBULATORY STATUS COMMUNICATION OF NEEDS Skin   Extensive Assist Verbally Surgical wounds (Closed incision on hip)                       Personal Care Assistance Level of Assistance  Bathing,  Feeding, Dressing Bathing Assistance: Maximum assistance Feeding assistance: Limited assistance Dressing Assistance: Limited assistance     Functional Limitations Info  Sight, Hearing Sight Info: Impaired Hearing Info: Impaired      SPECIAL CARE FACTORS FREQUENCY  PT (By licensed PT), OT (By licensed OT)     PT  Frequency: 5x/week OT Frequency: 5x/week            Contractures Contractures Info: Not present    Additional Factors Info  Code Status, Allergies, Psychotropic Code Status Info: DNR Allergies Info: Iodinated Contrast Media, Iodine, Levsin (Hyoscyamine Sulfate), Salmon (Fish Allergy), Shellfish Allergy, Tramadol, Remeron (Mirtazapine), Aspirin, Bis Subcit-metronid-tetracyc, Cephalexin, Ciprofloxacin, Codeine, Cyclobenzaprine, Darvocet (Propoxyphene N-acetaminophen), Demerol (Meperidine), Dexlansoprazole, Diphedryl (Diphenhydramine), Doxycycline Hyclate, Doxycycline Hyclate, Epinephrine, Erythromycin, Fish Oil, Hyoscyamine, Latex, Nitrofurantoin, Nsaids, Other, Prednisone, Ra Diphedryl Allergy (Diphenhydramine Hcl), Sertraline Hcl, Shellfish-derived Products, Sulfa Antibiotics, Wellbutrin (Bupropion), Xylocaine (Lidocaine Hcl), Xylocaine (Lidocaine), Biaxin (Clarithromycin), Ibuprofen, Meperidine Hcl, Penicillins Psychotropic Info: Prozac         Current Medications (07/09/2023):  This is the current hospital active medication list Current Facility-Administered Medications  Medication Dose Route Frequency Provider Last Rate Last Admin   acetaminophen (TYLENOL) tablet 650 mg  650 mg Oral Q6H PRN Elgergawy, Leana Roe, MD   650 mg at 07/09/23 1114   acidophilus (RISAQUAD) capsule 1 capsule  1 capsule Oral Once per day on Monday Thursday Jonah Blue, MD   1 capsule at 07/09/23 1038   ALPRAZolam Prudy Feeler) tablet 0.5 mg  0.5 mg Oral Q6H PRN Jonah Blue, MD   0.5 mg at 07/06/23 2052   Carbidopa-Levodopa ER (SINEMET CR) 25-100 MG tablet controlled release 1 tablet  1 tablet Oral TID Jonah Blue, MD   1 tablet at 07/06/23 2126   dextrose 5 % and 0.45 % NaCl with KCl 20 mEq/L infusion   Intravenous Continuous Elgergawy, Leana Roe, MD 75 mL/hr at 07/09/23 0431 New Bag at 07/09/23 0431   dicyclomine (BENTYL) capsule 10 mg  10 mg Oral QHS PRN Jonah Blue, MD       enoxaparin (LOVENOX)  injection 30 mg  30 mg Subcutaneous Q24H Elgergawy, Leana Roe, MD   30 mg at 07/08/23 1600   feeding supplement (ENSURE ENLIVE / ENSURE PLUS) liquid 237 mL  237 mL Oral BID BM Jonah Blue, MD       fentaNYL (SUBLIMAZE) injection 25-50 mcg  25-50 mcg Intravenous Q2H PRN Opyd, Lavone Neri, MD   50 mcg at 07/09/23 1157   FLUoxetine (PROZAC) capsule 10 mg  10 mg Oral Daily Jonah Blue, MD   10 mg at 07/06/23 1544   levothyroxine (SYNTHROID) tablet 25 mcg  25 mcg Oral Q0600 Jonah Blue, MD   25 mcg at 07/07/23 0554   LORazepam (ATIVAN) injection 0.5 mg  0.5 mg Intravenous Q6H PRN Elgergawy, Leana Roe, MD   0.5 mg at 07/08/23 1959   methocarbamol (ROBAXIN) tablet 500 mg  500 mg Oral Q6H PRN Jonah Blue, MD       Or   methocarbamol (ROBAXIN) injection 500 mg  500 mg Intravenous Q6H PRN Jonah Blue, MD   500 mg at 07/09/23 1157   oxyCODONE (Oxy IR/ROXICODONE) immediate release tablet 2.5 mg  2.5 mg Oral Q6H PRN Elgergawy, Leana Roe, MD       polyethylene glycol (MIRALAX / GLYCOLAX) packet 17 g  17 g Oral Daily Jonah Blue, MD         Discharge Medications: Please see discharge summary for a list of discharge medications.  Relevant Imaging Results:  Relevant Lab Results:   Additional Information SSN: [id number] [place], [place]

## 2023-07-09 NOTE — Progress Notes (Signed)
Occupational Therapy Treatment Patient Details Name: Tracy Malone MRN: [id number] DOB: Jun 09, 1932 Today's Date: 07/09/2023   History of present illness 89 y.o. female admitted 07/05/23 after fall sustaining R femoral neck fx, R proximal humerus fx (non-operative management). S/p R femur fixation 12/14. Of note, pt on home hospice for failure to thrive. Other PMH includes Parkinsons, fibromyalgia, carotid stenosis, kyphoplasty.   OT comments  Pt progressing well towards established OT goals and very motivated to participate despite pain. Pt requiring Max A +2 for bed mobility. Pt performing sit<>stand from EOB with Mod A +2 and then from recliner seat with Min A +2. Pt requiring Max A for reposition of shoulder immobilizer. Pt performing functional mobility with Min-Mod A +2. Discussing dc recommendation, WB status, and ROM restriction with daughter. Called [facility] for clarification on shoulder ROM restrictions and sling wear for ADLs; awaiting confirmation. Continue to recommend dc to post-acute rehab and will continue to follow acutely as admitted.       If plan is discharge home, recommend the following:  A lot of help with walking and/or transfers;A lot of help with bathing/dressing/bathroom   Equipment Recommendations  BSC/3in1    Recommendations for Other Services      Precautions / Restrictions Precautions Precautions: Fall Required Braces or Orthoses: Sling (Shoulder immobilizer) Restrictions Weight Bearing Restrictions Per Provider Order: Yes RUE Weight Bearing Per Provider Order: Non weight bearing RLE Weight Bearing Per Provider Order: Weight bearing as tolerated Other Position/Activity Restrictions: Maintain RUE in shoulder immobilizer. Assuming no ROM of R hsoulder; will seek clarification       Mobility Bed Mobility Overal bed mobility: Needs Assistance Bed Mobility: Supine to Sit     Supine to sit: Max assist, +2 for physical assistance, +2 for  safety/equipment, HOB elevated     General bed mobility comments: Max A for managing hips; pt able to assist in pushing into upright posture with LUE    Transfers Overall transfer level: Needs assistance Equipment used: 1 person hand held assist Transfers: Sit to/from Stand, Bed to chair/wheelchair/BSC Sit to Stand: Mod assist     Step pivot transfers: Min assist, +2 physical assistance     General transfer comment: Pt performing sit<>stand with Mod A +2 from EOB and taking steps to reclienr. Pt then completing second sit<>stand with Min A +2.     Balance Overall balance assessment: Needs assistance Sitting-balance support: Single extremity supported, Feet supported, No upper extremity supported Sitting balance-Leahy Scale: Fair     Standing balance support: Single extremity supported, During functional activity Standing balance-Leahy Scale: Poor Standing balance comment: reliant on assistance                           ADL either performed or assessed with clinical judgement   ADL Overall ADL's : Needs assistance/impaired                 Upper Body Dressing : Maximal assistance;Sitting Upper Body Dressing Details (indicate cue type and reason): max A for repositioning sling     Toilet Transfer: Moderate assistance;Minimal assistance;+2 for safety/equipment;Ambulation (simulated to recliner) Toilet Transfer Details (indicate cue type and reason): Pt performing sit<>stand with Mod A +2 from EOB and taking steps to reclienr. Pt then completing forward mobility with Min-Mod A +2 (chair follow)         Functional mobility during ADLs: Minimal assistance;Moderate assistance;+2 for safety/equipment General ADL Comments: Pt demonstrating increasing actvitiy toelrance.  Extremity/Trunk Assessment Upper Extremity Assessment Upper Extremity Assessment: Right hand dominant;RUE deficits/detail RUE Deficits / Details: Proximal humerus fx RUE Coordination:  decreased fine motor;decreased gross motor   Lower Extremity Assessment Lower Extremity Assessment: Defer to PT evaluation RLE Deficits / Details: s/p R femur fixation with expected post-op pain and weakness; gross strength 3/5 knee, </ 3/5 hip        Vision       Perception     Praxis      Cognition Arousal: Alert Behavior During Therapy: WFL for tasks assessed/performed Overall Cognitive Status: Within Functional Limits for tasks assessed                                          Exercises      Shoulder Instructions       General Comments Daughter present    Pertinent Vitals/ Pain       Pain Assessment Pain Assessment: Faces Faces Pain Scale: Hurts a little bit Pain Location: back Pain Descriptors / Indicators: Grimacing, Discomfort Pain Intervention(s): Monitored during session, Limited activity within patient's tolerance, Repositioned  Home Living                                          Prior Functioning/Environment              Frequency  Min 1X/week        Progress Toward Goals  OT Goals(current goals can now be found in the care plan section)  Progress towards OT goals: Progressing toward goals  Acute Rehab OT Goals OT Goal Formulation: With patient Time For Goal Achievement: 07/22/23 Potential to Achieve Goals: Good ADL Goals Pt Will Perform Upper Body Bathing: with min assist;sitting Pt Will Perform Upper Body Dressing: with min assist;sitting Pt Will Perform Lower Body Dressing: with min assist;sit to/from stand Pt Will Transfer to Toilet: bedside commode;stand pivot transfer;with contact guard assist Pt Will Perform Toileting - Clothing Manipulation and hygiene: with min assist;sit to/from stand;sitting/lateral leans  Plan      Co-evaluation    PT/OT/SLP Co-Evaluation/Treatment: Yes Reason for Co-Treatment: For patient/therapist safety;To address functional/ADL transfers   OT goals addressed  during session: ADL's and self-care      AM-PAC OT "6 Clicks" Daily Activity     Outcome Measure   Help from another person eating meals?: A Little Help from another person taking care of personal grooming?: A Little Help from another person toileting, which includes using toliet, bedpan, or urinal?: A Lot Help from another person bathing (including washing, rinsing, drying)?: A Lot Help from another person to put on and taking off regular upper body clothing?: A Lot Help from another person to put on and taking off regular lower body clothing?: A Lot 6 Click Score: 14    End of Session Equipment Utilized During Treatment: Gait belt;Other (comment) (sling)  OT Visit Diagnosis: Unsteadiness on feet (R26.81);Other abnormalities of gait and mobility (R26.89);Muscle weakness (generalized) (M62.81);Pain Pain - Right/Left: Right Pain - part of body: Leg;Shoulder   Activity Tolerance Patient tolerated treatment well   Patient Left in chair;with call bell/phone within reach;with chair alarm set;with family/visitor present   Nurse Communication Mobility status;Precautions;Weight bearing status;Patient requests pain meds        Time: 1129-1203 OT Time Calculation (min):  34 min  Charges: OT General Charges $OT Visit: 1 Visit OT Treatments $Self Care/Home Management : 8-22 mins  Pasqual Farias MSOT, OTR/L Acute Rehab Office: [phone]  Theodoro Grist Ioanna Colquhoun 07/09/2023, 12:01 PM

## 2023-07-09 NOTE — Plan of Care (Signed)
  Problem: Health Behavior/Discharge Planning: Goal: Ability to manage health-related needs will improve Outcome: Progressing   Problem: Clinical Measurements: Goal: Ability to maintain clinical measurements within normal limits will improve Outcome: Progressing Goal: Respiratory complications will improve Outcome: Progressing   Problem: Activity: Goal: Risk for activity intolerance will decrease Outcome: Progressing   Problem: Coping: Goal: Level of anxiety will decrease Outcome: Progressing   Problem: Pain Management: Goal: General experience of comfort will improve Outcome: Progressing

## 2023-07-09 NOTE — TOC Initial Note (Addendum)
Transition of Care [facility]) - Initial/Assessment Note    Patient Details  Name: TASHEE WIER MRN: [id number] Date of Birth: January 09, 1932  Transition of Care [facility]) CM/SW Contact:    Mearl Latin, LCSW Phone Number: 07/09/2023, 9:47 AM  Clinical Narrative:                 9:47am-CSW received consult for possible SNF placement at time of discharge. CSW spoke with patient's daughter as patient is down for test. She reported that patient does not want to go to SNF and she wants to return home with [facility], however, daughter is not sure how patient will be able to move at home. She requested CSW speak with the patient to see if she would be agreeable to SNF as her 42 year old husband is also at home. Patient has a rollator and toilet riser at home and has an aide come in.   1:27 PM-CSW met with patient but she reported being very uncomfortable and RN has given pain medication. CSW left list of SNF rehab bed offers and will follow up. Pasrr is pending.  5pm-Pasrr received and placed on FL2.  Skilled Nursing Rehab Facilities-   [email or web address]   Ratings out of 5 stars (5 the highest)   Name Address  Phone # Quality Care Staffing Health Inspection Overall  [facility] [address] [place], [place] [phone] 2 2 5 5   [facility] [address] [place] [phone] 4 2 4 4   [facility] [address], [place]in[place] Otto [phone] [facility] [address], [place] [phone] 3 1 4 3   [facility]  [address], [place] [phone] 4 4 5 5   [facility] [address], [facility] [phone] 3 2 2 2   [facility] [address], [place] [phone] 5 1 2 2   [facility] [address] [place] [phone] 1 1 3 1   [address] ([facility]) [address], [place] [phone] 2 2 2 2   [facility] [address] [place], [place] [phone] 2 2 1 1    [facility] [address], [place] [phone] 3 1 1 1   [facility] [address] Liliane [place] [phone] 3 3 4 4   [facility] [address] [place] [phone] 2 2 3 3           [facility] [address], [place] [phone] 4 2 1 1   [facility], [place] [address], [place] [phone] 1 1 2 1   [facility] [address] [employer] [phone] 2 1 4 3   [facility] [place] [address] [phone] 2 1 4 3   [facility] [address], [place] [phone] 2 3 3 3           [address] (no [facility]) [address], [place] [phone] 4 5 5 5   [facility] (No [employer]) [address] [place], [place] [phone] 1 2 4 3   [facility] [address], [place] [phone] 2 1 2 1   [facility] (No [employer]) [address] [place] [address], [place] [phone] 4 1 5 4   [facility] [address], [place] [phone] 3 4 2 2   [facility] [address], [place] [phone] 2 1 1 1   [facility] [address] Lil[place] [phone] 4 2 5 5   [facility]  [address] [place] [phone] 2 2 3 3   [facility] [address], [place]  [phone] 4 1 1 1   [facility] [address] [place], [place] [phone] 2 2 3 3           [facility] [address], [place] [phone] 2 1 1 1   [facility] [address] [place]lyn Clines  [phone] 3 3 3 3   [facility] (No Humana) [address], [place] [phone] 2 2 4 4   [facility]) [address], [place] [phone] 2 1 1 1   [facility] [address] [place]an [phone] 4 3 5 5   [facility] [address] [place], [place] [phone] 1 1 1 1           [facility] [address] [place], [place] [phone] 5 4 5 5   [facility])  [address], [place] [phone] 1 1 2 1   [facility]) [address], [place] [phone]  2 4 4   [facility] [address] [place] [phone] 3 5 5 5   [address] [place], [place] [phone] 4 2 2 2   [facility]) [address] [place] [phone] 2 1 3 2        Barriers to Discharge: Continued Medical Work up   Patient Goals and CMS Choice Patient states their goals for this hospitalization and ongoing recovery are:: Return home CMS Medicare.gov Compare Post Acute Care list provided to:: Patient Choice offered to / list presented to : Patient [place] ownership interest in [facility].provided to:: Patient    Expected Discharge Plan and Services In-house Referral: Clinical Social Work, Hospice / Palliative Care     Living arrangements for the past 2 months: Single Family Home                                      Prior Living Arrangements/Services Living arrangements for the past 2 months: Single Family Home Lives with:: Spouse Patient language and need for interpreter reviewed:: Yes Do you feel safe going back to the place where you live?: Yes      Need for Family Participation in Patient Care: Yes (Comment) Care giver support system in place?: Yes (comment) Current home services: DME, Homehealth aide (Rollator and potty chair) Criminal Activity/Legal Involvement Pertinent to Current Situation/Hospitalization: No - Comment as needed  Activities of Daily Living   ADL Screening (condition at time of admission) Independently performs ADLs?: No Does the patient have a NEW difficulty with bathing/dressing/toileting/self-feeding that is expected to last >3 days?: Yes (Initiates electronic notice to provider for possible OT consult) Does the patient have a NEW difficulty with getting in/out of bed, walking, or climbing stairs that is expected to last >3 days?: Yes (Initiates electronic notice to provider for possible PT consult) Does the patient have  a NEW difficulty with communication that is expected to last >3 days?: No Is the patient deaf or have difficulty hearing?: No Does the patient have difficulty seeing, even when wearing glasses/contacts?: No Does the patient have difficulty concentrating, remembering, or making decisions?: No  Permission Sought/Granted Permission sought to share information with : Facility Medical sales representative, Family Supports Permission granted to share information with : Yes, Verbal Permission Granted  Share Information with NAME: [name]  Permission granted to share info w AGENCY: Hospice  Permission granted to share info w Relationship: Daughter  Permission granted to share info w Contact Information: [phone]  Emotional Assessment Appearance:: Appears stated age Attitude/Demeanor/Rapport: Engaged Affect (typically observed): Accepting Orientation: : Oriented to Self, Oriented to Place, Oriented to  Time, Oriented to Situation Alcohol / Substance Use: Not Applicable Psych Involvement: No (comment)  Admission diagnosis:  Closed fracture of right proximal forearm [S52.91XA] Closed fracture of proximal end of right humerus, unspecified fracture morphology, initial encounter [S42.201A] Patient Active Problem List   Diagnosis Date Noted   Closed nondisplaced fracture of base of neck of right femur (HCC) 07/07/2023   Closed fracture of neck of right femur (HCC) 07/06/2023   Closed fracture of right proximal humerus 07/06/2023   Closed fracture of right proximal forearm 07/05/2023   Protein-calorie malnutrition (HCC) 03/19/2023   Allergic conjunctivitis of left eye 04/09/2022   Dry eye of left side 04/09/2022   Chronic left-sided low back pain with left-sided sciatica 01/26/2022   Senile purpura (HCC) 11/04/2021   Calculus of gallbladder without cholecystitis without obstruction 11/01/2021   Rash 03/18/2021   Herpes zoster without complication 03/18/2021   Multiple allergies 03/18/2021   Frail  elderly 03/18/2021   LUQ pain 10/29/2019   Poor appetite 10/29/2019   Loss of weight 10/29/2019   Nausea and vomiting 10/29/2019   Altered bowel habits 10/29/2019   Lumbar compression fracture (HCC) 04/03/2019   Compression fracture of L1 lumbar vertebra (HCC) 03/16/2019   Osteoporosis of multiple sites 03/16/2019   Essential tremor 02/06/2019   Ischemic colitis (HCC)    Leukocytosis    Acute colitis 11/22/2018   Colitis 11/22/2018   Cystitis    Parkinson's disease (HCC) 10/24/2018   Gait disturbance 11/28/2017   Chronic lymphocytic thyroiditis 04/24/2017   Status post removal of thyroid nodule 04/24/2017   Depression with anxiety 03/16/2017   Subcutaneous nodules 03/16/2017   Aortic atherosclerosis (HCC) 11/24/2016   Allergy to multiple antibiotics 10/07/2016   Seafood allergy, anaphylaxis, subsequent encounter 10/07/2016   Helicobacter pylori gastritis 10/07/2016   Fall 09/05/2016   Bloating 08/15/2016   Severe recurrent major depression without psychotic features (HCC) 04/13/2016    Class: Chronic   Rash and nonspecific skin eruption 03/01/2016   Leg pain, bilateral 03/01/2016   Functional dyspepsia 10/13/2015   Subacromial bursitis 03/02/2015   Resting tremor 02/01/2015   Epigastric fullness 10/12/2014   Early satiety 10/12/2014   Cystocele 12/30/2013   IBS (irritable bowel syndrome) 09/30/2013   GERD (gastroesophageal reflux disease) 06/27/2013   PVC's (premature ventricular contractions) 10/02/2012   Depressive disorder, not elsewhere classified 10/02/2012   Bradycardia 08/01/2012   Fatigue 08/01/2012   Anxiety state 05/13/2012   Osteopenia 05/13/2012   Hypothyroidism 05/13/2012   PCP:  Joselyn Arrow, MD Pharmacy:   [facility] [id number] - [address], [place] - [address] [place] [address] Phone: [phone] Fax: [phone]     Social Drivers of Health (SDOH) Social History: SDOH Screenings   Food Insecurity: No  Food Insecurity (07/06/2023)  Housing: Low Risk  (07/06/2023)  Transportation Needs: No Transportation Needs (07/06/2023)  Utilities: Not At Risk (07/06/2023)  Depression (PHQ2-9): High Risk (11/09/2022)  Financial Resource Strain: Low Risk  (05/05/2021)  Social Connections: Unknown (12/06/2021)   [facility]  Tobacco Use: Low Risk  (07/07/2023)   SDOH Interventions:     Readmission Risk Interventions     No data to display

## 2023-07-09 NOTE — Telephone Encounter (Signed)
I called pt, spoke to husband of pt trying to move up her appt per her request.  She is in hospital due to fall (broke hip and shoulder),

## 2023-07-09 NOTE — Progress Notes (Addendum)
Modified Barium Swallow Study  Patient Details  Name: AKAI SWEETING MRN: [id number] Date of Birth: June 25, 1932  Today's Date: 07/09/2023  Modified Barium Swallow completed.  Full report located under Chart Review in the Imaging Section.  History of Present Illness LILJA JOST is a 89 y.o. female with medical history significant of carotid stenosis, fibromyalgia, hypothyroidism, and Parkinson's who presented on 12/12 with a fall.  She has a R humerus and R hip fracture.  Patient has been on home hospice for FTT.  She got up today and had a mechanical fall, landing on R side and injuring both her R arm and leg. Pt underwent surgery for right femoral neck fracture on 12/14.   Clinical Impression Pt demonstrated moderate oropharyngeal dysphagia. Suspect pt had a baseline dysphagia that is now exacerbated with deconditioned state. She was able to maintain an orally cohesive bolus and transit briskly however her tongue base retraction and pharyngeal wall contraction and stripping wave were reduced leaving residue in posterior pharynx and valleculae that she frequently brought back up into oral cavity and reswallowed. Material briefly entered area between soft palate and pharyngeal wall. She could achieve adequate laryngeal elevation but anterior excursion was reduced. Her epiglottic deflection was decreased reaching only horizontal position. Pt silently aspirated thin liquids during multiple swallows in attempts to clear diffuse pharyngeal residue. Cue for hard cough could not clear aspirate and and a chin tuck was ineffective to prevent aspiration. Nectar thick penetrated (PAS 3) with reflexive throat clear that cleared penetrate. Pt did aspirate nectar when using a straw from the pyriform sinus residue. Verbal cues for second swallow intermittently reduced pharyngeal residue to min-mild. Honey thick was silently aspirated from swallow of pyriform sinus residue. She was able to clear esophagus seen on  esophageal scan. Recommend regular texture, nectar thick liquids, no straws, swallow twice, throat clear/cough during meals and crush meds. ST will continue to follow. Factors that may increase risk of adverse event in presence of aspiration Rubye Oaks [employer] Clearance Coots 2021): Frail or deconditioned  Swallow Evaluation Recommendations Recommendations: PO diet PO Diet Recommendation: Regular;Mildly thick liquids (Level 2, nectar thick) Liquid Administration via: No straw;Cup Medication Administration: Crushed with puree Supervision: Patient able to self-feed;Full supervision/cueing for swallowing strategies Swallowing strategies  : Slow rate;Small bites/sips;Multiple dry swallows after each bite/sip;Clear throat intermittently Postural changes: Position pt fully upright for meals Oral care recommendations: Oral care BID (2x/day)      Royce Macadamia 07/09/2023,11:33 AM

## 2023-07-09 NOTE — Progress Notes (Signed)
Nutrition Follow-up  DOCUMENTATION CODES:   Underweight  INTERVENTION:  Ensure Plus High Protein po BID, each supplement provides 350 kcal and 20 grams of protein. Multivitamin with minerals   NUTRITION DIAGNOSIS:   Increased nutrient needs related to hip fracture as evidenced by estimated needs.    GOAL:   Patient will meet greater than or equal to 90% of their needs    MONITOR:   PO intake, Supplement acceptance, Diet advancement  REASON FOR ASSESSMENT:   Consult Hip fracture protocol, Assessment of nutrition requirement/status  ASSESSMENT:   89 y.o. F presented from home where she lives with husband due to a fall on 12/12, resulting in R humerus and R hip fracture. Currently on Home hospice for FTT.  PMH: Depression, panic attack, fibromyalgia, IBS, CAD, recurrent UTI, Ocular migraine, GERD, Bell's palsy, Parkinson's.  Pt resting at time of visit no family at bed side at time of visit. Pt did not wake to RD voice.  Suspect current nutr poc providing adequate nutrition.    NUTRITION - FOCUSED PHYSICAL EXAM:  Flowsheet Row Most Recent Value  Orbital Region Moderate depletion  Upper Arm Region Moderate depletion  Thoracic and Lumbar Region Moderate depletion  Buccal Region Moderate depletion  Temple Region Moderate depletion  Clavicle Bone Region Moderate depletion  Clavicle and Acromion Bone Region Moderate depletion  Scapular Bone Region Unable to assess  Dorsal Hand Moderate depletion  Patellar Region Moderate depletion  Anterior Thigh Region Unable to assess  Posterior Calf Region Unable to assess  Edema (RD Assessment) None  Hair Reviewed  Eyes Reviewed  Mouth Reviewed  Skin Reviewed  Nails Reviewed       Diet Order:   Diet Order             Diet regular Room service appropriate? No; Fluid consistency: Nectar Thick  Diet effective now                   EDUCATION NEEDS:   Not appropriate for education at this time  Skin:  Skin  Assessment: Skin Integrity Issues: Skin Integrity Issues:: Incisions Incisions: Right hip  Last BM:  PTA  Height:   Ht Readings from Last 1 Encounters:  07/07/23 4' 7.98" (1.422 m)    Weight:   Wt Readings from Last 1 Encounters:  07/07/23 37 kg    Ideal Body Weight:     BMI:  Body mass index is 18.3 kg/m.  Estimated Nutritional Needs:   Kcal:  1305-1500 kcal/d  Protein:  60-75 g/d  Fluid:  >/= 1.5 L/D    Jamelle Haring RDN, LDN Clinical Dietitian  Pleas see Amion for contact information

## 2023-07-09 NOTE — Progress Notes (Signed)
PROGRESS NOTE    Tracy Malone  TFT:[id number] DOB: Dec 03, 1931 DOA: 07/05/2023 PCP: Joselyn Arrow, MD    Chief Complaint  Patient presents with   Fall    Brief Narrative:    Tracy Malone is a 89 y.o. female with medical history significant of carotid stenosis, fibromyalgia, hypothyroidism, and Parkinson's who presented on 12/12 with a fall.  She has a R humerus and R hip fracture.  Patient has been on home hospice for FTT.  She got up today and had a mechanical fall, landing on R side and injuring both her R arm and leg, she was admitted for further workup.  Patient is under hospice care, seen by orthopedic, upon discussion with family, plan was to proceed with surgery, that is post right femoral neck fracture percutaneous screw fixation by Dr. Christell Constant 07/07/2023.  Patient had difficulty passing bedside swallow evaluation, went for [facility] 12/16, where she was started on regular, thickened liquid diet  Assessment & Plan:   Principal Problem:   Closed fracture of neck of right femur (HCC) Active Problems:   Closed fracture of right proximal forearm   Closed fracture of right proximal humerus   Closed nondisplaced fracture of base of neck of right femur (HCC)     Femoral neck fracture Apparently mechanical fall resulting in hip fracture Orthopedics consulted, they have discussed with the family, at this point daughter, son-in-law proceeded to proceed with surgery, -DVT prophylaxis per orthopedic, has aspirin allergy, recommendation for Xarelto, she will be on Lovenox during hospital stay, and Xarelto upon discharge -PT-OT consulted, going to be difficult specially with her humerus fracture -Status post right femoral neck fracture percutaneous screw fixation by Dr. Christell Constant 07/07/2023. -Multiple allergies, she is on IV fentanyl for pain, and now she is on a diet will add as needed oxycodone.     Humerus fracture Non-operative Will need sling and pain control This will complicate use of a  walker regarding hip fracture   Parkinson's Continue Sinemet  Dysphagia -MBS today, diet advanced to regular with thickened liquids   Carotid stenosis No longer appears to be following with vascular   Fibromyalgia/IBS She "hurts all the time" Continue Bentyl prn   Hypothyroidism Continue Synthroid   Anxiety Continue alprazolam, fluoxetine   Leukocytosis -Most likely stress related, she is nontoxic-appearing, ending down, negative, UA no acute findings on chest x-ray      DVT prophylaxis: Lovenox Code Status: DNR Family Communication: None at bedside Disposition:   Status is: Inpatient    Consultants:  Orthopedic   Subjective:  She has been complaining of pain, asking when she can eat.  Objective: Vitals:   07/08/23 2306 07/09/23 0400 07/09/23 0754 07/09/23 0800  BP: 125/60 (!) 129/57 (!) 120/46 (!) 119/52  Pulse: 78 91 96 78  Resp: 18 19 18    Temp: 98.5 F (36.9 C) 98.3 F (36.8 C) 98.6 F (37 C)   TempSrc: Oral Oral Oral   SpO2: 97% 95% 96% 95%  Weight:      Height:        Intake/Output Summary (Last 24 hours) at 07/09/2023 1202 Last data filed at 07/09/2023 0432 Gross per 24 hour  Intake --  Output 325 ml  Net -325 ml   Filed Weights   07/06/23 1329 07/07/23 1016  Weight: 37 kg 37 kg    Examination:  Awake Alert, Oriented X 2, extremely frail and deconditioned. Symmetrical Chest wall movement, Good air movement bilaterally, CTAB RRR,No Gallops,Rubs or new Murmurs, No  Parasternal Heave +ve B.Sounds, Abd Soft, No tenderness, No rebound - guarding or rigidity. No Cyanosis, , right arm in a sling   Data Reviewed: I have personally reviewed following labs and imaging studies  CBC: Recent Labs  Lab 07/05/23 2331 07/07/23 0452 07/08/23 0544 07/09/23 0428  WBC 19.3* 25.6* 16.1* 14.5*  HGB 11.6* 12.4 11.0* 12.2  HCT 34.8* 38.8 33.4* 38.9  MCV 94.3 96.0 93.3 97.7  PLT 196 200 174 141*    Basic Metabolic Panel: Recent Labs  Lab  07/05/23 2331 07/07/23 0452 07/08/23 0544 07/09/23 0428  NA 138 141 137 137  K 4.3 5.0 3.9 3.3*  CL 102 105 104 108  CO2 25 21* 26 23  GLUCOSE 134* 79 127* 98  BUN 19 19 13 8   CREATININE 0.58 0.88 0.57 0.57  CALCIUM 9.3 9.5 9.1 8.2*    GFR: Estimated Creatinine Clearance: 27.3 mL/min (by C-G formula based on SCr of 0.57 mg/dL).  Liver Function Tests: No results for input(s): "AST", "ALT", "ALKPHOS", "BILITOT", "PROT", "ALBUMIN" in the last 168 hours.  CBG: Recent Labs  Lab 07/07/23 0209  GLUCAP 94     No results found for this or any previous visit (from the past 240 hours).       Radiology Studies: DG Swallowing Func-Speech Pathology Result Date: 07/09/2023 Table formatting from the original result was not included. Modified Barium Swallow Study Patient Details Name: Tracy Malone MRN: [id number] Date of Birth: 03-17-1932 Today's Date: 07/09/2023 HPI/PMH: HPI: Tracy Malone is a 89 y.o. female with medical history significant of carotid stenosis, fibromyalgia, hypothyroidism, and Parkinson's who presented on 12/12 with a fall.  She has a R humerus and R hip fracture.  Patient has been on home hospice for FTT.  She got up today and had a mechanical fall, landing on R side and injuring both her R arm and leg. Pt underwent surgery for right femoral neck fracture on 12/14. Clinical Impression: Clinical Impression: Pt demonstrated moderate oropharyngeal dysphagia. Suspect pt had a baseline dysphagia that is now exacerbated with deconditioned state. She was able to maintain an orally cohesive bolus and transit briskly however her tongue base retraction and pharyngeal wall contraction and stripping wave were reduced leaving residue in posterior pharynx and valleculae that she frequently brought back up into oral cavity and reswallowed. Material briefly entered area between soft palate and pharyngeal wall. She could achieve adequate laryngeal elevation but anterior excursion was  reduced. Her epiglottic deflection was decreased reaching only horizontal position. Pt silently aspirated thin liquids during multiple swallows in attempts to clear diffuse pharyngeal residue. Cue for hard cough could not clear aspirate and and a chin tuck was ineffective to prevent aspiration. Nectar thick penetrated (PAS 3) with reflexive throat clear that cleared penetrate. Pt did aspirate nectar when using a straw from the pyriform sinus residue. Verbal cues for second swallow intermittently reduced pharyngeal residue to min-mild. Honey thick was silently aspirated from swallow of pyriform sinus residue. She was able to clear esophagus seen on esophageal scan. Recommend regular texture, nectar thick liquids, no straws, swallow twice, throat clear/cough during meals and crush meds. ST will continue to follow. Factors that may increase risk of adverse event in presence of aspiration Rubye Oaks & Clearance Coots 2021): Factors that may increase risk of adverse event in presence of aspiration Rubye Oaks & Clearance Coots 2021): Frail or deconditioned Recommendations/Plan: Swallowing Evaluation Recommendations Swallowing Evaluation Recommendations Recommendations: PO diet PO Diet Recommendation: Regular; Mildly thick liquids (Level 2, nectar thick) Liquid  Administration via: No straw; Cup Medication Administration: Crushed with puree Supervision: Patient able to self-feed; Full supervision/cueing for swallowing strategies Swallowing strategies  : Slow rate; Small bites/sips; Multiple dry swallows after each bite/sip; Clear throat intermittently Postural changes: Position pt fully upright for meals Oral care recommendations: Oral care BID (2x/day) Caregiver Recommendations: Have oral suction available Treatment Plan Treatment Plan Treatment recommendations: Therapy as outlined in treatment plan below Follow-up recommendations: Outpatient SLP Functional status assessment: Patient has had a recent decline in their functional status and  demonstrates the ability to make significant improvements in function in a reasonable and predictable amount of time. Treatment frequency: Min 2x/week Treatment duration: 2 weeks Interventions: Aspiration precaution training; Oropharyngeal exercises; Compensatory techniques; Patient/family education; Trials of upgraded texture/liquids; Diet toleration management by SLP Recommendations Recommendations for follow up therapy are one component of a multi-disciplinary discharge planning process, led by the attending physician.  Recommendations may be updated based on patient status, additional functional criteria and insurance authorization. Assessment: Orofacial Exam: Orofacial Exam Oral Cavity: Oral Hygiene: WFL Oral Cavity - Dentition: Adequate natural dentition Orofacial Anatomy: WFL Oral Motor/Sensory Function: WFL Anatomy: Anatomy: WFL Boluses Administered: Boluses Administered Boluses Administered: Thin liquids (Level 0); Mildly thick liquids (Level 2, nectar thick); Moderately thick liquids (Level 3, honey thick); Puree; Solid  Oral Impairment Domain: Oral Impairment Domain Lip Closure: No labial escape Tongue control during bolus hold: Cohesive bolus between tongue to palatal seal Bolus preparation/mastication: Timely and efficient chewing and mashing Bolus transport/lingual motion: Brisk tongue motion Oral residue: Trace residue lining oral structures Location of oral residue : Tongue Initiation of pharyngeal swallow : Valleculae  Pharyngeal Impairment Domain: Pharyngeal Impairment Domain Soft palate elevation: Trace column of contrast or air between SP and PW Laryngeal elevation: Complete superior movement of thyroid cartilage with complete approximation of arytenoids to epiglottic petiole Anterior hyoid excursion: Partial anterior movement Epiglottic movement: Partial inversion Laryngeal vestibule closure: Incomplete, narrow column air/contrast in laryngeal vestibule Pharyngeal stripping wave : Present -  diminished Pharyngeal contraction (A/P view only): N/A Pharyngoesophageal segment opening: Partial distention/partial duration, partial obstruction of flow Tongue base retraction: Wide column of contrast or air between tongue base and PPW Pharyngeal residue: Collection of residue within or on pharyngeal structures Location of pharyngeal residue: Valleculae; Pyriform sinuses  Esophageal Impairment Domain: Esophageal Impairment Domain Esophageal clearance upright position: Complete clearance, esophageal coating Pill: No data recorded Penetration/Aspiration Scale Score: Penetration/Aspiration Scale Score 1.  Material does not enter airway: Solid; Puree 2.  Material enters airway, remains ABOVE vocal cords then ejected out: Mildly thick liquids (Level 2, nectar thick) 3.  Material enters airway, remains ABOVE vocal cords and not ejected out: Moderately thick liquids (Level 3, honey thick); Thin liquids (Level 0) 8.  Material enters airway, passes BELOW cords without attempt by patient to eject out (silent aspiration) : Thin liquids (Level 0); Mildly thick liquids (Level 2, nectar thick) (nectar aspirated with a straw) Compensatory Strategies: Compensatory Strategies Compensatory strategies: Yes Straw: Ineffective Ineffective Straw: Mildly thick liquid (Level 2, nectar thick) Multiple swallows: Effective Chin tuck: Ineffective Ineffective Chin Tuck: Thin liquid (Level 0)   General Information: Caregiver present: No  Diet Prior to this Study: Regular; Thin liquids (Level 0); Other (Comment) (per MD)   Temperature : Normal   Respiratory Status: WFL   Supplemental O2: None (Room air)   History of Recent Intubation: No (not intubated for surgery per notes and from daughter)  Behavior/Cognition: Alert; Cooperative; Pleasant mood; Requires cueing Self-Feeding Abilities: Able to self-feed  Baseline vocal quality/speech: Dysphonic Volitional Cough: Able to elicit Volitional Swallow: Able to elicit Exam Limitations: No limitations  Goal Planning: Prognosis for improved oropharyngeal function: -- (fair-good) No data recorded No data recorded Patient/Family Stated Goal: To eat/drink comfortably Consulted and agree with results and recommendations: Patient; Family member/caregiver Pain: Pain Assessment Pain Assessment: Faces Faces Pain Scale: 2 Pain Location: back Pain Descriptors / Indicators: Grimacing; Discomfort Pain Intervention(s): Monitored during session End of Session: Start Time:SLP Start Time (ACUTE ONLY): 0927 Stop Time: SLP Stop Time (ACUTE ONLY): 0950 Time Calculation:SLP Time Calculation (min) (ACUTE ONLY): 23 min Charges: SLP Evaluations $ SLP Speech Visit: 1 Visit SLP Evaluations $BSS Swallow: 1 Procedure $MBS Swallow: 1 Procedure SLP visit diagnosis: SLP Visit Diagnosis: Dysphagia, oropharyngeal phase (R13.12) Past Medical History: Past Medical History: Diagnosis Date  Acute lower UTI 11/22/2018  Bell's palsy 1966  Hx: right side facial droop, resolved per patient 04/02/19  Carotid artery disease (HCC) 2010  on vascular screening;unchanged 2013.(could not tolerate simvastatin, no other statins tried)--<30% blockage bilat 07/2011  Claustrophobia   Depression   treated in the past for years;stopped in 2010 for a years  Duodenal ulcer 1962  h/o  Dysrhythmia   ocassional PVC's, no current problems per patient on 04/02/19  Fibromyalgia   chronic fatigue, chronic abdominal pain  Frequent PVCs 07/2012  Seen by [facility]: benign, asymptomatic, normal EF  Gallstones 02/2021  on [place]  GERD (gastroesophageal reflux disease)   diet controlled  Glaucoma, narrow-angle   s/p laser surgery  History of hiatal hernia   during endoscopy  Hypothyroid 03/2007  IBS (irritable bowel syndrome)   Dr. Elnoria Howard  Ischemic colitis (HCC) 11/21/2018  no current problems per patient on 04/02/19  Lichenoid keratosis 12/03/2020  Dr.Stinehelfer  Ocular migraine   Osteoporosis 04/2010  Dr.Hawkes; later consulted Dr. Sharl Ma (2022)  Panic attack   Parkinson's disease (HCC)  06/23/2016  Recurrent UTI   has cystocele-Dr.Grewal  Shingles 1999  h/o  Superficial thrombophlebitis 03/2009  RLE  Trochanteric bursitis 12/2008  bilateral Past Surgical History: Past Surgical History: Procedure Laterality Date  ABDOMINAL HYSTERECTOMY    BACK SURGERY  05/2019  CATARACT EXTRACTION, BILATERAL  1995, 1996  EYE SURGERY Bilateral   laser - glaucoma  Flexible sigmoidoscopy    FRACTURE SURGERY  2020, Sept.  12th (!)  KYPHOPLASTY N/A 04/03/2019  Procedure: KYPHOPLASTY L1;  Surgeon: Venita Lick, MD;  Location: [facility] OR;  Service: Orthopedics;  Laterality: N/A;  90 mins  RADIOFREQUENCY ABLATION Bilateral 11/14/2022  L3-4, L4-5 bilateral ([facility])  THYROIDECTOMY, PARTIAL  09/2005  L nodule; Dr. Gerrit Friends  TONSILLECTOMY  1946  UPPER GI ENDOSCOPY  06/27/2012  VAGINAL HYSTERECTOMY  1971  and bladder repair.  Still has ovaries  WISDOM TOOTH EXTRACTION   Royce Macadamia 07/09/2023, 11:33 AM  DG Pelvis 1-2 Views Result Date: 07/07/2023 CLINICAL DATA:  Postoperative. Status post right proximal femoral ORIF. EXAM: PELVIS - 1-2 VIEW COMPARISON:  AP bilateral hips and lateral view of the right hip 07/05/2023 FINDINGS: There is diffuse decreased bone mineralization. Interval ORIF of the previously seen subcapital proximal femoral neck fracture with 3 screws. No hardware complication is seen. Mild bilateral superior femoroacetabular joint space narrowing and peripheral osteophytosis, similar to prior. Mild bilateral sacroiliac subchondral sclerosis. IMPRESSION: Interval ORIF of the previously seen subcapital proximal right femoral neck fracture. No hardware complication is seen. Electronically Signed   By: Neita Garnet M.D.   On: 07/07/2023 14:48   DG HIP UNILAT WITH PELVIS 2-3 VIEWS  RIGHT Result Date: 07/07/2023 CLINICAL DATA:  Elective surgery. Narrowing of right femoral neck fracture. Intraoperative fluoroscopy. EXAM: DG HIP (WITH OR WITHOUT PELVIS) 2-3V RIGHT COMPARISON:  Right hip and femur  radiographs 07/05/2023 FINDINGS: Images were performed intraoperatively without the presence of a radiologist. There are 3 new screws fixating the previously seen right subcapital acute fracture. No hardware complication is seen. Total fluoroscopy images: 3 Total fluoroscopy time: 101 seconds Total dose: Radiation Exposure Index (as provided by the fluoroscopic device): 5.77 mGy air Kerma Please see intraoperative findings for further detail. IMPRESSION: Intraoperative fluoroscopy for right femoral neck fracture fixation. Electronically Signed   By: Neita Garnet M.D.   On: 07/07/2023 14:47   DG C-Arm 1-60 Min-No Report Result Date: 07/07/2023 Fluoroscopy was utilized by the requesting physician.  No radiographic interpretation.   DG C-Arm 1-60 Min-No Report Result Date: 07/07/2023 Fluoroscopy was utilized by the requesting physician.  No radiographic interpretation.        Scheduled Meds:  acidophilus  1 capsule Oral Once per day on Monday Thursday   Carbidopa-Levodopa ER  1 tablet Oral TID   enoxaparin (LOVENOX) injection  30 mg Subcutaneous Q24H   feeding supplement  237 mL Oral BID BM   FLUoxetine  10 mg Oral Daily   levothyroxine  25 mcg Oral Q0600   polyethylene glycol  17 g Oral Daily   Continuous Infusions:  dextrose 5 % and 0.45 % NaCl with KCl 20 mEq/L 75 mL/hr at 07/09/23 0431     LOS: 3 days      Huey Bienenstock, MD [facility]   To contact the attending provider between 7A-7P or the covering provider during after hours 7P-7A, please log into the web site [email or web address] and access using universal [place] password for that web site. If you do not have the password, please call the hospital operator.  07/09/2023, 12:02 PM

## 2023-07-09 NOTE — Care Management Important Message (Signed)
Important Message  Patient Details  Name: Tracy Malone MRN: [id number] Date of Birth: 05-24-1932   Important Message Given:  Yes - Medicare IM     Dorena Bodo 07/09/2023, 4:27 PM

## 2023-07-10 ENCOUNTER — Ambulatory Visit: Payer: BLUE CROSS/BLUE SHIELD | Admitting: Dermatology

## 2023-07-10 DIAGNOSIS — S72001S Fracture of unspecified part of neck of right femur, sequela: Secondary | ICD-10-CM | POA: Diagnosis not present

## 2023-07-10 MED ORDER — DEXTROSE IN LACTATED RINGERS 5 % IV SOLN: INTRAVENOUS | Status: DC

## 2023-07-10 MED ORDER — POTASSIUM CHLORIDE CRYS ER 20 MEQ PO TBCR
40.0000 meq | EXTENDED_RELEASE_TABLET | Freq: Once | ORAL | Status: DC
  Filled 2023-07-10: qty 2

## 2023-07-10 NOTE — TOC Progression Note (Addendum)
Transition of Care [facility]) - Progression Note    Patient Details  Name: Tracy Malone MRN: [id number] Date of Birth: 02/15/1932  Transition [facility] CM/SW Contact  Mearl Latin, LCSW Phone Number: 07/10/2023, 12:40 PM  Clinical Narrative:    12:40 PM-CSW spoke with patient's daughter and emailed her the SNF bed offers and ratings list to [email or web address]. She will review in preparation for possible discharge tomorrow.   1:02 PM-Patient's daughter returned call and has selected [facility]. CSW contacted [facility] and they will have a bed for patient tomorrow. Daughter updated and requests [facility] for transport. She will bring clothes for patient in the morning.     Barriers to Discharge: Continued Medical Work up  Expected Discharge Plan and Services In-house Referral: Clinical Social Work, Hospice / Palliative Care     Living arrangements for the past 2 months: Single Family Home                                       Social Determinants of Health (SDOH) Interventions SDOH Screenings   Food Insecurity: No Food Insecurity (07/06/2023)  Housing: Low Risk  (07/06/2023)  Transportation Needs: No Transportation Needs (07/06/2023)  Utilities: Not At Risk (07/06/2023)  Depression (PHQ2-9): High Risk (11/09/2022)  Financial Resource Strain: Low Risk  (05/05/2021)  Social Connections: Unknown (12/06/2021)   Received from [facility]  Tobacco Use: Low Risk  (07/07/2023)    Readmission Risk Interventions     No data to display

## 2023-07-10 NOTE — Progress Notes (Signed)
PROGRESS NOTE    Tracy Malone  MVH:[id number] DOB: July 11, 1932 DOA: 07/05/2023 PCP: Joselyn Arrow, MD    Chief Complaint  Patient presents with   Fall    Brief Narrative:    Tracy Malone is a 89 y.o. female with medical history significant of carotid stenosis, fibromyalgia, hypothyroidism, and Parkinson's who presented on 12/12 with a fall.  She has a R humerus and R hip fracture.  Patient has been on home hospice for FTT.  She got up today and had a mechanical fall, landing on R side and injuring both her R arm and leg, she was admitted for further workup.  Patient is under hospice care, seen by orthopedic, upon discussion with family, plan was to proceed with surgery, that is post right femoral neck fracture percutaneous screw fixation by Dr. Christell Constant 07/07/2023.  Patient had difficulty passing bedside swallow evaluation, went for [facility] 12/16, where she was started on regular, thickened liquid diet, but overall she is with poor appetite, plan for SNF discharge.  Assessment & Plan:   Principal Problem:   Closed fracture of neck of right femur [facility]) Active Problems:   Closed fracture of right proximal forearm   Closed fracture of right proximal humerus   Closed nondisplaced fracture of base of neck of right femur (HCC)     Femoral neck fracture - Apparently mechanical fall resulting in hip fracture - Orthopedics consulted, they have discussed with the family, agreeable to surgery,  -Status post right femoral neck fracture percutaneous screw fixation by Dr. Christell Constant 07/07/2023. -DVT prophylaxis per orthopedic, has aspirin allergy, recommendation for Xarelto, she will be on Lovenox during hospital stay, and Xarelto upon discharge -PT-OT consulted, going to be difficult specially with her right humerus fracture, recommendation for SNF placement -Multiple allergies, she is on IV fentanyl for pain, and now she is on a diet will add as needed oxycodone.     Humerus  fracture Non-operative Will need sling and pain control This will complicate use of a walker regarding hip fracture   Parkinson's Continue Sinemet  Dysphagia -MBS today, diet advanced to regular with thickened liquids   Carotid stenosis No longer appears to be following with vascular   Fibromyalgia/IBS She "hurts all the time" Continue Bentyl prn   Hypothyroidism Continue Synthroid   Anxiety Continue alprazolam, fluoxetine   Leukocytosis -Most likely stress related, she is nontoxic-appearing, ending down, negative, UA no acute findings on chest x-ray      DVT prophylaxis: Lovenox Code Status: DNR Family Communication: I have discussed with daughter [name] by phone today Disposition: To go tomorrow to SNF.  Status is: Inpatient    Consultants:  Orthopedic   Subjective:  Patient remains with poor appetite, like to swallow her meds as full, but she swallows them crushed in a chocolate pudding.  Objective: Vitals:   07/10/23 0831 07/10/23 1004 07/10/23 1200 07/10/23 1453  BP: (!) 142/57 (!) 141/64 139/64 135/75  Pulse: 73 82 78   Resp: 17 18 17 16   Temp: 98.8 F (37.1 C)  97.7 F (36.5 C)   TempSrc: Axillary  Oral   SpO2:  97%    Weight:      Height:        Intake/Output Summary (Last 24 hours) at 07/10/2023 1506 Last data filed at 07/09/2023 2201 Gross per 24 hour  Intake --  Output 500 ml  Net -500 ml   Filed Weights   07/06/23 1329 07/07/23 1016  Weight: 37 kg 37 kg  Examination:  Awake Alert, Oriented X 2, extremely frail and deconditioned. Symmetrical Chest wall movement, Good air movement bilaterally, CTAB RRR,No Gallops,Rubs or new Murmurs, No Parasternal Heave +ve B.Sounds, Abd Soft, No tenderness, No rebound - guarding or rigidity. No Cyanosis, , right arm in a sling   Data Reviewed: I have personally reviewed following labs and imaging studies  CBC: Recent Labs  Lab 07/05/23 2331 07/07/23 0452 07/08/23 0544 07/09/23 0428   WBC 19.3* 25.6* 16.1* 14.5*  HGB 11.6* 12.4 11.0* 12.2  HCT 34.8* 38.8 33.4* 38.9  MCV 94.3 96.0 93.3 97.7  PLT 196 200 174 141*    Basic Metabolic Panel: Recent Labs  Lab 07/05/23 2331 07/07/23 0452 07/08/23 0544 07/09/23 0428  NA 138 141 137 137  K 4.3 5.0 3.9 3.3*  CL 102 105 104 108  CO2 25 21* 26 23  GLUCOSE 134* 79 127* 98  BUN 19 19 13 8   CREATININE 0.58 0.88 0.57 0.57  CALCIUM 9.3 9.5 9.1 8.2*    GFR: Estimated Creatinine Clearance: 27.3 mL/min (by C-G formula based on SCr of 0.57 mg/dL).  Liver Function Tests: No results for input(s): "AST", "ALT", "ALKPHOS", "BILITOT", "PROT", "ALBUMIN" in the last 168 hours.  CBG: Recent Labs  Lab 07/07/23 0209  GLUCAP 94     No results found for this or any previous visit (from the past 240 hours).       Radiology Studies: DG Swallowing Func-Speech Pathology Result Date: 07/09/2023 Table formatting from the original result was not included. Modified Barium Swallow Study Patient Details Name: Tracy Malone MRN: [id number] Date of Birth: 28-Oct-1931 Today's Date: 07/09/2023 HPI/PMH: HPI: Tracy Malone is a 89 y.o. female with medical history significant of carotid stenosis, fibromyalgia, hypothyroidism, and Parkinson's who presented on 12/12 with a fall.  She has a R humerus and R hip fracture.  Patient has been on home hospice for FTT.  She got up today and had a mechanical fall, landing on R side and injuring both her R arm and leg. Pt underwent surgery for right femoral neck fracture on 12/14. Clinical Impression: Clinical Impression: Pt demonstrated moderate oropharyngeal dysphagia. Suspect pt had a baseline dysphagia that is now exacerbated with deconditioned state. She was able to maintain an orally cohesive bolus and transit briskly however her tongue base retraction and pharyngeal wall contraction and stripping wave were reduced leaving residue in posterior pharynx and valleculae that she frequently brought back up  into oral cavity and reswallowed. Material briefly entered area between soft palate and pharyngeal wall. She could achieve adequate laryngeal elevation but anterior excursion was reduced. Her epiglottic deflection was decreased reaching only horizontal position. Pt silently aspirated thin liquids during multiple swallows in attempts to clear diffuse pharyngeal residue. Cue for hard cough could not clear aspirate and and a chin tuck was ineffective to prevent aspiration. Nectar thick penetrated (PAS 3) with reflexive throat clear that cleared penetrate. Pt did aspirate nectar when using a straw from the pyriform sinus residue. Verbal cues for second swallow intermittently reduced pharyngeal residue to min-mild. Honey thick was silently aspirated from swallow of pyriform sinus residue. She was able to clear esophagus seen on esophageal scan. Recommend regular texture, nectar thick liquids, no straws, swallow twice, throat clear/cough during meals and crush meds. ST will continue to follow. Factors that may increase risk of adverse event in presence of aspiration Rubye Oaks & Clearance Coots 2021): Factors that may increase risk of adverse event in presence of aspiration Rubye Oaks & Clearance Coots  2021): Frail or deconditioned Recommendations/Plan: Swallowing Evaluation Recommendations Swallowing Evaluation Recommendations Recommendations: PO diet PO Diet Recommendation: Regular; Mildly thick liquids (Level 2, nectar thick) Liquid Administration via: No straw; Cup Medication Administration: Crushed with puree Supervision: Patient able to self-feed; Full supervision/cueing for swallowing strategies Swallowing strategies  : Slow rate; Small bites/sips; Multiple dry swallows after each bite/sip; Clear throat intermittently Postural changes: Position pt fully upright for meals Oral care recommendations: Oral care BID (2x/day) Caregiver Recommendations: Have oral suction available Treatment Plan Treatment Plan Treatment recommendations:  Therapy as outlined in treatment plan below Follow-up recommendations: Outpatient SLP Functional status assessment: Patient has had a recent decline in their functional status and demonstrates the ability to make significant improvements in function in a reasonable and predictable amount of time. Treatment frequency: Min 2x/week Treatment duration: 2 weeks Interventions: Aspiration precaution training; Oropharyngeal exercises; Compensatory techniques; Patient/family education; Trials of upgraded texture/liquids; Diet toleration management by SLP Recommendations Recommendations for follow up therapy are one component of a multi-disciplinary discharge planning process, led by the attending physician.  Recommendations may be updated based on patient status, additional functional criteria and insurance authorization. Assessment: Orofacial Exam: Orofacial Exam Oral Cavity: Oral Hygiene: WFL Oral Cavity - Dentition: Adequate natural dentition Orofacial Anatomy: WFL Oral Motor/Sensory Function: WFL Anatomy: Anatomy: WFL Boluses Administered: Boluses Administered Boluses Administered: Thin liquids (Level 0); Mildly thick liquids (Level 2, nectar thick); Moderately thick liquids (Level 3, honey thick); Puree; Solid  Oral Impairment Domain: Oral Impairment Domain Lip Closure: No labial escape Tongue control during bolus hold: Cohesive bolus between tongue to palatal seal Bolus preparation/mastication: Timely and efficient chewing and mashing Bolus transport/lingual motion: Brisk tongue motion Oral residue: Trace residue lining oral structures Location of oral residue : Tongue Initiation of pharyngeal swallow : Valleculae  Pharyngeal Impairment Domain: Pharyngeal Impairment Domain Soft palate elevation: Trace column of contrast or air between SP and PW Laryngeal elevation: Complete superior movement of thyroid cartilage with complete approximation of arytenoids to epiglottic petiole Anterior hyoid excursion: Partial anterior  movement Epiglottic movement: Partial inversion Laryngeal vestibule closure: Incomplete, narrow column air/contrast in laryngeal vestibule Pharyngeal stripping wave : Present - diminished Pharyngeal contraction (A/P view only): N/A Pharyngoesophageal segment opening: Partial distention/partial duration, partial obstruction of flow Tongue base retraction: Wide column of contrast or air between tongue base and PPW Pharyngeal residue: Collection of residue within or on pharyngeal structures Location of pharyngeal residue: Valleculae; Pyriform sinuses  Esophageal Impairment Domain: Esophageal Impairment Domain Esophageal clearance upright position: Complete clearance, esophageal coating Pill: No data recorded Penetration/Aspiration Scale Score: Penetration/Aspiration Scale Score 1.  Material does not enter airway: Solid; Puree 2.  Material enters airway, remains ABOVE vocal cords then ejected out: Mildly thick liquids (Level 2, nectar thick) 3.  Material enters airway, remains ABOVE vocal cords and not ejected out: Moderately thick liquids (Level 3, honey thick); Thin liquids (Level 0) 8.  Material enters airway, passes BELOW cords without attempt by patient to eject out (silent aspiration) : Thin liquids (Level 0); Mildly thick liquids (Level 2, nectar thick) (nectar aspirated with a straw) Compensatory Strategies: Compensatory Strategies Compensatory strategies: Yes Straw: Ineffective Ineffective Straw: Mildly thick liquid (Level 2, nectar thick) Multiple swallows: Effective Chin tuck: Ineffective Ineffective Chin Tuck: Thin liquid (Level 0)   General Information: Caregiver present: No  Diet Prior to this Study: Regular; Thin liquids (Level 0); Other (Comment) (per MD)   Temperature : Normal   Respiratory Status: WFL   Supplemental O2: None (Room air)   History  of Recent Intubation: No (not intubated for surgery per notes and from daughter)  Behavior/Cognition: Alert; Cooperative; Pleasant mood; Requires cueing  Self-Feeding Abilities: Able to self-feed Baseline vocal quality/speech: Dysphonic Volitional Cough: Able to elicit Volitional Swallow: Able to elicit Exam Limitations: No limitations Goal Planning: Prognosis for improved oropharyngeal function: -- (fair-good) No data recorded No data recorded Patient/Family Stated Goal: To eat/drink comfortably Consulted and agree with results and recommendations: Patient; Family member/caregiver Pain: Pain Assessment Pain Assessment: Faces Faces Pain Scale: 2 Pain Location: back Pain Descriptors / Indicators: Grimacing; Discomfort Pain Intervention(s): Monitored during session End of Session: Start Time:SLP Start Time (ACUTE ONLY): 0927 Stop Time: SLP Stop Time (ACUTE ONLY): 0950 Time Calculation:SLP Time Calculation (min) (ACUTE ONLY): 23 min Charges: SLP Evaluations $ SLP Speech Visit: 1 Visit SLP Evaluations $BSS Swallow: 1 Procedure $MBS Swallow: 1 Procedure SLP visit diagnosis: SLP Visit Diagnosis: Dysphagia, oropharyngeal phase (R13.12) Past Medical History: Past Medical History: Diagnosis Date  Acute lower UTI 11/22/2018  Bell's palsy 1966  Hx: right side facial droop, resolved per patient 04/02/19  Carotid artery disease (HCC) 2010  on vascular screening;unchanged 2013.(could not tolerate simvastatin, no other statins tried)--<30% blockage bilat 07/2011  Claustrophobia   Depression   treated in the past for years;stopped in 2010 for a years  Duodenal ulcer 1962  h/o  Dysrhythmia   ocassional PVC's, no current problems per patient on 04/02/19  Fibromyalgia   chronic fatigue, chronic abdominal pain  Frequent PVCs 07/2012  Seen by [place] [facility]: benign, asymptomatic, normal EF  Gallstones 02/2021  on [place]  GERD (gastroesophageal reflux disease)   diet controlled  Glaucoma, narrow-angle   s/p laser surgery  History of hiatal hernia   during endoscopy  Hypothyroid 03/2007  IBS (irritable bowel syndrome)   Dr. Elnoria Howard  Ischemic colitis (HCC) 11/21/2018  no current problems per patient on  04/02/19  Lichenoid keratosis 12/03/2020  Dr.Stinehelfer  Ocular migraine   Osteoporosis 04/2010  Dr.Hawkes; later consulted Dr. Sharl Ma (2022)  Panic attack   Parkinson's disease (HCC) 06/23/2016  Recurrent UTI   has cystocele-Dr.Grewal  Shingles 1999  h/o  Superficial thrombophlebitis 03/2009  RLE  Trochanteric bursitis 12/2008  bilateral Past Surgical History: Past Surgical History: Procedure Laterality Date  ABDOMINAL HYSTERECTOMY    BACK SURGERY  05/2019  CATARACT EXTRACTION, BILATERAL  1995, 1996  EYE SURGERY Bilateral   laser - glaucoma  Flexible sigmoidoscopy    FRACTURE SURGERY  2020, Sept.  12th (!)  KYPHOPLASTY N/A 04/03/2019  Procedure: KYPHOPLASTY L1;  Surgeon: Venita Lick, MD;  Location: [facility] OR;  Service: Orthopedics;  Laterality: N/A;  90 mins  RADIOFREQUENCY ABLATION Bilateral 11/14/2022  L3-4, L4-5 bilateral ([facility])  THYROIDECTOMY, PARTIAL  09/2005  L nodule; Dr. Gerrit Friends  TONSILLECTOMY  1946  UPPER GI ENDOSCOPY  06/27/2012  VAGINAL HYSTERECTOMY  1971  and bladder repair.  Still has ovaries  WISDOM TOOTH EXTRACTION   Royce Macadamia 07/09/2023, 11:33 AM       Scheduled Meds:  acidophilus  1 capsule Oral Once per day on Monday Thursday   Carbidopa-Levodopa ER  1 tablet Oral TID   enoxaparin (LOVENOX) injection  30 mg Subcutaneous Q24H   feeding supplement  237 mL Oral BID BM   FLUoxetine  10 mg Oral Daily   levothyroxine  25 mcg Oral Q0600   multivitamin with minerals  1 tablet Oral Daily   polyethylene glycol  17 g Oral Daily   potassium chloride  40 mEq Oral Once  Continuous Infusions:  lactated ringers 75 mL/hr at 07/10/23 0932     LOS: 4 days      Huey Bienenstock, MD [facility]   To contact the attending provider between 7A-7P or the covering provider during after hours 7P-7A, please log into the web site [email or web address] and access using universal [place] password for that web site. If you do not have the password, please call the hospital  operator.  07/10/2023, 3:06 PM

## 2023-07-10 NOTE — Progress Notes (Signed)
Speech Language Pathology Treatment: Dysphagia  Patient Details Name: Tracy Malone MRN: [id number] DOB: [birth year] Today's Date: 07/10/2023 Time: 8295-6213 SLP Time Calculation (min) (ACUTE ONLY): 29 min  Assessment / Plan / Recommendation Clinical Impression  Progress note and new order from hospitalist with note stating pt felt she was choking when trying to swallow her food. When asked about this pt only mentioned (and son-in-law) that some of the crushed parts of the medicine was hard to swallow. Reviewed results of MBS with pt and son-in-law. She was able to state her strategies as they are written on the wall in front of her and implement during session with mod cues. There was no coughing but several delayed throat clears that did not seen related to her strategy during session. She was able to masticate solid texture without difficulty but only ate bites of banana for breakfast stating she didn't like the eggs and the potatoes appeared too hard. When asked if she wanted to try minced foods she declined and therapist changed her to room service with assist so she can order foods she may have more success with and will leave her on regular texture, nectar thick liquids, crushed meds, swallow twice and clear throat intermittently. On MBS pt found to have decreased tongue base retraction and was introduced to 2 exercises (Masako maneuver and effortful swallow). Pt was able to follow commands and perform exercises with supervision. Given handout and further explanation/demonstration and encouraged to perform 5-10 repetitions 2-3 times a day as able. Also provided with mouth rinse to perform oral care. ST will continue to follow.    HPI HPI: Tracy Malone is a 89 y.o. female with medical history significant of carotid stenosis, fibromyalgia, hypothyroidism, and Parkinson's who presented on 12/12 with a fall.  She has a R humerus and R hip fracture.  Patient has been on home hospice for FTT.  She  got up today and had a mechanical fall, landing on R side and injuring both her R arm and leg. Pt underwent surgery for right femoral neck fracture on 12/14.      SLP Plan  Continue with current plan of care      Recommendations for follow up therapy are one component of a multi-disciplinary discharge planning process, led by the attending physician.  Recommendations may be updated based on patient status, additional functional criteria and insurance authorization.    Recommendations  Diet recommendations: Regular;Nectar-thick liquid Liquids provided via: No straw;Cup Medication Administration: Crushed with puree Supervision: Patient able to self feed;Intermittent supervision to cue for compensatory strategies Compensations: Minimize environmental distractions;Slow rate;Small sips/bites;Clear throat intermittently;Multiple dry swallows after each bite/sip Postural Changes and/or Swallow Maneuvers: Seated upright 90 degrees                  Oral care BID   Intermittent Supervision/Assistance Dysphagia, oropharyngeal phase (R13.12)     Continue with current plan of care     Royce Macadamia  07/10/2023, 11:13 AM

## 2023-07-10 NOTE — Plan of Care (Signed)
  Problem: Health Behavior/Discharge Planning: Goal: Ability to manage health-related needs will improve Outcome: Progressing   Problem: Clinical Measurements: Goal: Ability to maintain clinical measurements within normal limits will improve Outcome: Progressing   Problem: Activity: Goal: Risk for activity intolerance will decrease Outcome: Progressing   Problem: Pain Management: Goal: General experience of comfort will improve Outcome: Progressing   Problem: Safety: Goal: Ability to remain free from injury will improve Outcome: Progressing

## 2023-07-11 ENCOUNTER — Ambulatory Visit: Payer: BLUE CROSS/BLUE SHIELD | Admitting: Psychiatry

## 2023-07-11 DIAGNOSIS — G250 Essential tremor: Secondary | ICD-10-CM | POA: Diagnosis not present

## 2023-07-11 DIAGNOSIS — G20A1 Parkinson's disease without dyskinesia, without mention of fluctuations: Secondary | ICD-10-CM | POA: Diagnosis not present

## 2023-07-11 DIAGNOSIS — R5382 Chronic fatigue, unspecified: Secondary | ICD-10-CM | POA: Diagnosis not present

## 2023-07-11 DIAGNOSIS — Z515 Encounter for palliative care: Secondary | ICD-10-CM | POA: Diagnosis not present

## 2023-07-11 DIAGNOSIS — Z681 Body mass index (BMI) 19 or less, adult: Secondary | ICD-10-CM | POA: Diagnosis not present

## 2023-07-11 DIAGNOSIS — S42201D Unspecified fracture of upper end of right humerus, subsequent encounter for fracture with routine healing: Secondary | ICD-10-CM | POA: Diagnosis not present

## 2023-07-11 DIAGNOSIS — J168 Pneumonia due to other specified infectious organisms: Secondary | ICD-10-CM | POA: Diagnosis not present

## 2023-07-11 DIAGNOSIS — M797 Fibromyalgia: Secondary | ICD-10-CM | POA: Diagnosis not present

## 2023-07-11 DIAGNOSIS — R627 Adult failure to thrive: Secondary | ICD-10-CM | POA: Diagnosis not present

## 2023-07-11 DIAGNOSIS — F0284 Dementia in other diseases classified elsewhere, unspecified severity, with anxiety: Secondary | ICD-10-CM | POA: Diagnosis not present

## 2023-07-11 DIAGNOSIS — D638 Anemia in other chronic diseases classified elsewhere: Secondary | ICD-10-CM | POA: Diagnosis not present

## 2023-07-11 DIAGNOSIS — Z7401 Bed confinement status: Secondary | ICD-10-CM | POA: Diagnosis not present

## 2023-07-11 DIAGNOSIS — J690 Pneumonitis due to inhalation of food and vomit: Secondary | ICD-10-CM | POA: Diagnosis not present

## 2023-07-11 DIAGNOSIS — E039 Hypothyroidism, unspecified: Secondary | ICD-10-CM | POA: Diagnosis not present

## 2023-07-11 DIAGNOSIS — S42301D Unspecified fracture of shaft of humerus, right arm, subsequent encounter for fracture with routine healing: Secondary | ICD-10-CM | POA: Diagnosis not present

## 2023-07-11 DIAGNOSIS — R059 Cough, unspecified: Secondary | ICD-10-CM | POA: Diagnosis not present

## 2023-07-11 DIAGNOSIS — F411 Generalized anxiety disorder: Secondary | ICD-10-CM | POA: Diagnosis not present

## 2023-07-11 DIAGNOSIS — I959 Hypotension, unspecified: Secondary | ICD-10-CM | POA: Diagnosis not present

## 2023-07-11 DIAGNOSIS — M810 Age-related osteoporosis without current pathological fracture: Secondary | ICD-10-CM | POA: Diagnosis not present

## 2023-07-11 DIAGNOSIS — F418 Other specified anxiety disorders: Secondary | ICD-10-CM | POA: Diagnosis not present

## 2023-07-11 DIAGNOSIS — D62 Acute posthemorrhagic anemia: Secondary | ICD-10-CM | POA: Diagnosis not present

## 2023-07-11 DIAGNOSIS — K219 Gastro-esophageal reflux disease without esophagitis: Secondary | ICD-10-CM | POA: Diagnosis not present

## 2023-07-11 DIAGNOSIS — R1312 Dysphagia, oropharyngeal phase: Secondary | ICD-10-CM | POA: Diagnosis not present

## 2023-07-11 DIAGNOSIS — W19XXXA Unspecified fall, initial encounter: Secondary | ICD-10-CM | POA: Diagnosis not present

## 2023-07-11 DIAGNOSIS — R2689 Other abnormalities of gait and mobility: Secondary | ICD-10-CM | POA: Diagnosis not present

## 2023-07-11 DIAGNOSIS — Z66 Do not resuscitate: Secondary | ICD-10-CM | POA: Diagnosis not present

## 2023-07-11 DIAGNOSIS — I2510 Atherosclerotic heart disease of native coronary artery without angina pectoris: Secondary | ICD-10-CM | POA: Diagnosis not present

## 2023-07-11 DIAGNOSIS — R531 Weakness: Secondary | ICD-10-CM | POA: Diagnosis not present

## 2023-07-11 DIAGNOSIS — J189 Pneumonia, unspecified organism: Secondary | ICD-10-CM | POA: Diagnosis not present

## 2023-07-11 DIAGNOSIS — J9601 Acute respiratory failure with hypoxia: Secondary | ICD-10-CM | POA: Diagnosis not present

## 2023-07-11 DIAGNOSIS — K30 Functional dyspepsia: Secondary | ICD-10-CM | POA: Diagnosis not present

## 2023-07-11 DIAGNOSIS — B029 Zoster without complications: Secondary | ICD-10-CM | POA: Diagnosis not present

## 2023-07-11 DIAGNOSIS — R0902 Hypoxemia: Secondary | ICD-10-CM | POA: Diagnosis not present

## 2023-07-11 DIAGNOSIS — S42201A Unspecified fracture of upper end of right humerus, initial encounter for closed fracture: Secondary | ICD-10-CM | POA: Diagnosis not present

## 2023-07-11 DIAGNOSIS — D75839 Thrombocytosis, unspecified: Secondary | ICD-10-CM | POA: Diagnosis not present

## 2023-07-11 DIAGNOSIS — R54 Age-related physical debility: Secondary | ICD-10-CM | POA: Diagnosis not present

## 2023-07-11 DIAGNOSIS — M79603 Pain in arm, unspecified: Secondary | ICD-10-CM | POA: Diagnosis not present

## 2023-07-11 DIAGNOSIS — W19XXXD Unspecified fall, subsequent encounter: Secondary | ICD-10-CM | POA: Diagnosis present

## 2023-07-11 DIAGNOSIS — K589 Irritable bowel syndrome without diarrhea: Secondary | ICD-10-CM | POA: Diagnosis not present

## 2023-07-11 DIAGNOSIS — E46 Unspecified protein-calorie malnutrition: Secondary | ICD-10-CM | POA: Diagnosis not present

## 2023-07-11 DIAGNOSIS — Z889 Allergy status to unspecified drugs, medicaments and biological substances status: Secondary | ICD-10-CM | POA: Diagnosis not present

## 2023-07-11 DIAGNOSIS — R918 Other nonspecific abnormal finding of lung field: Secondary | ICD-10-CM | POA: Diagnosis not present

## 2023-07-11 DIAGNOSIS — K922 Gastrointestinal hemorrhage, unspecified: Secondary | ICD-10-CM | POA: Diagnosis not present

## 2023-07-11 DIAGNOSIS — S72001D Fracture of unspecified part of neck of right femur, subsequent encounter for closed fracture with routine healing: Secondary | ICD-10-CM | POA: Diagnosis not present

## 2023-07-11 DIAGNOSIS — R278 Other lack of coordination: Secondary | ICD-10-CM | POA: Diagnosis not present

## 2023-07-11 DIAGNOSIS — M6281 Muscle weakness (generalized): Secondary | ICD-10-CM | POA: Diagnosis not present

## 2023-07-11 DIAGNOSIS — F02811 Dementia in other diseases classified elsewhere, unspecified severity, with agitation: Secondary | ICD-10-CM | POA: Diagnosis not present

## 2023-07-11 MED ORDER — RIVAROXABAN 10 MG PO TABS: 10.0000 mg | ORAL_TABLET | Freq: Every day | ORAL | Status: DC

## 2023-07-11 MED ORDER — ALPRAZOLAM 0.5 MG PO TABS: ORAL_TABLET | ORAL | 0 refills | Status: DC

## 2023-07-11 MED ORDER — METHOCARBAMOL 500 MG PO TABS: 500.0000 mg | ORAL_TABLET | Freq: Four times a day (QID) | ORAL | Status: DC | PRN

## 2023-07-11 MED ORDER — OXYCODONE HCL 5 MG PO TABS: 5.0000 mg | ORAL_TABLET | Freq: Four times a day (QID) | ORAL | 0 refills | Status: DC | PRN

## 2023-07-11 MED ORDER — ACETAMINOPHEN 325 MG PO TABS: 650.0000 mg | ORAL_TABLET | Freq: Four times a day (QID) | ORAL | Status: DC | PRN

## 2023-07-11 MED ORDER — ENSURE ENLIVE PO LIQD: 237.0000 mL | Freq: Two times a day (BID) | ORAL | Status: DC

## 2023-07-11 NOTE — Progress Notes (Signed)
Called and gave report to transfer facility RN, Curley Spice.

## 2023-07-11 NOTE — Progress Notes (Signed)
Attempted to call report to [facility] facility for transfer report. No answer. Voicemail left for return call with this RNs name and number.

## 2023-07-11 NOTE — TOC Transition Note (Signed)
Transition of Care [facility]) - Discharge Note   Patient Details  Name: Tracy Malone MRN: [id number] Date of Birth: [birth year]  Transition of Care [facility]) CM/SW Contact:  Erin Sons, LCSW Phone Number: 07/11/2023, 10:01 AM   Clinical Narrative:     Per MD patient ready for DC to [facility]. RN, patient, patient's family, and facility notified of DC. Discharge Summary and FL2 sent to facility. RN to call report prior to discharge [phone]). DC packet on chart. Ambulance transport requested for patient.   CSW will sign off for now as social work intervention is no longer needed. Please consult [place] again if new needs arise.   Final next level of care: Skilled Nursing Facility Barriers to Discharge: No Barriers Identified   Patient Goals and CMS Choice Patient states their goals for this hospitalization and ongoing recovery are:: Return home CMS Medicare.gov Compare Post Acute Care list provided to:: Patient Choice offered to / list presented to : Patient [place] ownership interest in [facility].provided to:: Patient    Discharge Placement              Patient chooses bed at: [facility] Patient to be transferred to facility by: [facility] Name of family member notified: Daughter [name] Patient and family notified of of transfer: 07/11/23  Discharge Plan and Services Additional resources added to the After Visit Summary for   In-house Referral: Clinical Social Work, Hospice / Palliative Care                                   Social Drivers of Health (SDOH) Interventions SDOH Screenings   Food Insecurity: No Food Insecurity (07/06/2023)  Housing: Low Risk  (07/06/2023)  Transportation Needs: No Transportation Needs (07/06/2023)  Utilities: Not At Risk (07/06/2023)  Depression (PHQ2-9): High Risk (11/09/2022)  Financial Resource Strain: Low Risk  (05/05/2021)  Social Connections: Unknown (12/06/2021)   Received from [facility], [facility]  Tobacco  Use: Low Risk  (07/07/2023)     Readmission Risk Interventions     No data to display

## 2023-07-11 NOTE — Progress Notes (Signed)
Physical Therapy Treatment Patient Details Name: Tracy Malone MRN: [id number] DOB: 04/15/1932 Today's Date: 07/11/2023   History of Present Illness 89 y.o. female admitted 07/05/23 after fall sustaining R femoral neck fx, R proximal humerus fx (non-operative management). S/p R femur fixation 12/14. Of note, pt on home hospice for failure to thrive. Other PMH includes Parkinsons, fibromyalgia, carotid stenosis, kyphoplasty.    PT Comments  Pt received in supine and agreeable to session. Pt limited by increased RLE pain this session. Pt reports dizziness once sitting EOB that improves with seated rest. Pt requires HHA during standing transfers for anterior weight shift and support. Dense cues required for sequencing throughout. Pt able to take a few steps forward with increased time and mod A +2 for support and balance. Pt requests to use BSC and is able to pivot to [facility] and back to recliner with mod A +2 due to RLE instability and pain. Pt continues to benefit from PT services to progress toward functional mobility goals.     If plan is discharge home, recommend the following: A lot of help with walking and/or transfers;A lot of help with bathing/dressing/bathroom;Assistance with cooking/housework;Assist for transportation;Help with stairs or ramp for entrance   Can travel by private vehicle        Equipment Recommendations       Recommendations for Other Services       Precautions / Restrictions Precautions Precautions: Fall Required Braces or Orthoses: Sling Restrictions Weight Bearing Restrictions Per Provider Order: Yes RUE Weight Bearing Per Provider Order: Non weight bearing RLE Weight Bearing Per Provider Order: Weight bearing as tolerated     Mobility  Bed Mobility Overal bed mobility: Needs Assistance Bed Mobility: Supine to Sit     Supine to sit: Max assist, +2 for physical assistance, +2 for safety/equipment, HOB elevated     General bed mobility comments: Pt able  to minimally advance BLE to EOB, however requires helicopter to EOB due to pain    Transfers Overall transfer level: Needs assistance Equipment used: 1 person hand held assist Transfers: Sit to/from Stand, Bed to chair/wheelchair/BSC Sit to Stand: Mod assist, +2 safety/equipment   Step pivot transfers: Mod assist, +2 physical assistance       General transfer comment: STS from EOB with mod A and HHA for anterior weight shift and power up. Attempted with pt pushing from EOB, however is unsuccessful because pt unable to shift weight over BOS. Mod A +2 for balance and support during step pivot due to RLE instability and difficulty advancing LLE    Ambulation/Gait Ambulation/Gait assistance: Mod assist, +2 physical assistance Gait Distance (Feet): 5 Feet Assistive device: 1 person hand held assist Gait Pattern/deviations: Shuffle, Decreased stride length, Step-to pattern, Antalgic, Decreased step length - left, Trunk flexed       General Gait Details: Pt demonstrates very slow, short steps with difficulty advancing LLE due to limited RLE WB tolerance. Mod A+2 for balance and assist with weight shift for LLE advancement.       Balance Overall balance assessment: Needs assistance Sitting-balance support: Single extremity supported, Feet supported, No upper extremity supported Sitting balance-Leahy Scale: Fair Sitting balance - Comments: sitting EOB   Standing balance support: Single extremity supported, During functional activity Standing balance-Leahy Scale: Poor Standing balance comment: with L HHA                            Cognition Arousal: Alert Behavior During Therapy:  WFL for tasks assessed/performed Overall Cognitive Status: Within Functional Limits for tasks assessed                                          Exercises      General Comments        Pertinent Vitals/Pain Pain Assessment Pain Assessment: Faces Faces Pain Scale: Hurts  whole lot Pain Location: RLE Pain Descriptors / Indicators: Aching, Grimacing, Guarding, Moaning Pain Intervention(s): Limited activity within patient's tolerance, Monitored during session, Repositioned     PT Goals (current goals can now be found in the care plan section) Acute Rehab PT Goals Patient Stated Goal: decreased pain, return home PT Goal Formulation: With patient Time For Goal Achievement: 07/22/23 Progress towards PT goals: Progressing toward goals    Frequency    Min 1X/week       AM-PAC PT "6 Clicks" Mobility   Outcome Measure  Help needed turning from your back to your side while in a flat bed without using bedrails?: A Lot Help needed moving from lying on your back to sitting on the side of a flat bed without using bedrails?: A Lot Help needed moving to and from a bed to a chair (including a wheelchair)?: A Lot Help needed standing up from a chair using your arms (e.g., wheelchair or bedside chair)?: A Lot Help needed to walk in hospital room?: Total Help needed climbing 3-5 steps with a railing? : Total 6 Click Score: 10    End of Session Equipment Utilized During Treatment: Gait belt Activity Tolerance: Patient limited by pain;Patient limited by fatigue Patient left: in chair;with call bell/phone within reach;with chair alarm set;with family/visitor present;with nursing/sitter in room Nurse Communication: Mobility status PT Visit Diagnosis: Other abnormalities of gait and mobility (R26.89);Muscle weakness (generalized) (M62.81)     Time: 1610-9604 PT Time Calculation (min) (ACUTE ONLY): 43 min  Charges:    $Gait Training: 8-22 mins $Therapeutic Activity: 23-37 mins PT General Charges $$ ACUTE PT VISIT: 1 Visit                    Johny Shock, PTA Acute Rehabilitation Services Secure Chat Preferred  [phone]    Johny Shock 07/11/2023, 10:00 AM

## 2023-07-11 NOTE — Discharge Summary (Signed)
PATIENT DETAILS Name: Tracy Malone Age: 89 y.o. Sex: female Date of Birth: 1931/08/31 MRN: [id number]. Admitting Physician: Briscoe Deutscher, MD ION:GEXBM, Eve, MD  Admit Date: 07/05/2023 Discharge date: 07/11/2023  Recommendations for Outpatient Follow-up:  Follow up with PCP in 1-2 weeks Please obtain CMP/CBC in one week Please ensure follow-up with orthopedics in 1-2 weeks.  Continued hospice follow-up at SNF  Admitted From:  Home  Disposition: Skilled nursing facility   Discharge Condition: fair  CODE STATUS:   Code Status: Do not attempt resuscitation (DNR) - Comfort care   Diet recommendation:  Diet Order             Diet general           Diet regular Room service appropriate? Yes with Assist; Fluid consistency: Nectar Thick  Diet effective now                    Brief Summary: 89 year old with advanced Parkinson's disease-on home hospice for failure to thrive syndrome-presented following a mechanical fall-she was found to have right humerus and right hip fracture.  Right humerus fracture was managed medically.  She underwent surgical management of the right hip fracture.  Brief Hospital Course: Right hip fracture S/p percutaneous screw fixation 12/14 Postoperative course unremarkable WBAT to RLE Xarelto for VTE prophylaxis Ensure follow-up with orthopedics post discharge in 1-2 weeks.  Right humerus fracture Nonoperative management with sling/pain control. NWB RUE to sling  Parkinson's disease Continue Sinemet  Oropharyngeal dysphagia Likely related to Parkinson's disease Regular diet with thick liquids SLP eval  Mild leukocytosis Likely stress margination/reactive to hip fracture/humerus fracture No clinical suggestion of infection UA/CXR negative  Hypothyroidism Synthroid  Anxiety disorder Stable Xanax/fluoxetine  Fibromyalgia/IBS Supportive care  Failure to thrive syndrome/palliative care DNR in place Followed by hospice  prior to this hospitalization-continue hospice follow-up at SNF  Nutrition Status: Nutrition Problem: Moderate Malnutrition Etiology: chronic illness Signs/Symptoms: moderate fat depletion, moderate muscle depletion Interventions: Ensure Enlive (each supplement provides 350kcal and 20 grams of protein), MVI  Discharge Diagnoses:  Principal Problem:   Closed fracture of neck of right femur (HCC) Active Problems:   Closed fracture of right proximal forearm   Closed fracture of right proximal humerus   Closed nondisplaced fracture of base of neck of right femur [facility])   Discharge Instructions:  Activity:  WBAT (weight bearing as tolerated) to right lower extremity NWB (nonweight bearing) RUE in sling  Discharge Instructions     Call MD for:  extreme fatigue   Complete by: As directed    Call MD for:  redness, tenderness, or signs of infection (pain, swelling, redness, odor or green/yellow discharge around incision site)   Complete by: As directed    Diet general   Complete by: As directed    Increase activity slowly   Complete by: As directed       Allergies as of 07/11/2023       Reactions   Iodinated Contrast Media Anaphylaxis   Iodine Anaphylaxis   IV and topical forms. Unknown   Levsin [hyoscyamine Sulfate]    Vision problems/pt has glaucoma   Salmon [fish Allergy] Hives, Shortness Of Breath   Shellfish Allergy Anaphylaxis   Tramadol Swelling   Remeron [mirtazapine] Other (See Comments)   Cause blurred vision and red eyes, pt has glaucoma   Aspirin Other (See Comments)   Sever stomach pain due to ulcer scaring.   Bis Subcit-metronid-tetracyc Swelling   Tongue swelling. Face tingling  Cephalexin Hives   Ciprofloxacin Diarrhea   Codeine Nausea And Vomiting   Cyclobenzaprine Other (See Comments)   Tingly/prickly sensation.   Darvocet [propoxyphene N-acetaminophen] Nausea And Vomiting   Demerol [meperidine] Nausea Only   Dexlansoprazole Swelling   Redness,  swelling and peeling of both feet, foot pain   Diphedryl [diphenhydramine] Other (See Comments)   Increased pulse/small amount ok   Doxycycline Hyclate Other (See Comments)   GI intolerance.   Doxycycline Hyclate     GI intolerance   Epinephrine Other (See Comments)   Breathing problems/fainting   Erythromycin Other (See Comments)   GI intolerance.   Fish Oil    breathing problems/hives   Hyoscyamine    eye pain   Latex Other (See Comments)   Gloves ok.  Skin gets red from elastic in underwear and latex bandaides.   Nitrofurantoin Diarrhea   Nsaids Other (See Comments)   Other    migraines: increased pulse, faint, diarrhea   Prednisone Other (See Comments)   Headache Other reaction(s): headache   Ra Diphedryl Allergy [diphenhydramine Hcl]    Other reaction(s): increased pulse small dose okay   Sertraline Hcl Swelling, Other (See Comments)   Migraine Swelling of tongue/lip (09/2012) Other reaction(s): Unknown   Shellfish-derived Products    Other reaction(s): Unknown   Sulfa Antibiotics Other (See Comments)   Increased pulse, fainting, diarrhea, thrush   Wellbutrin [bupropion] Other (See Comments)   Headaches   Xylocaine [lidocaine Hcl]    With epinephrine, given by dentist.  Speeded up heart rate and she passed out (occured twice, at dentist)   Xylocaine [lidocaine]    Unknown   Biaxin [clarithromycin] Rash   Started after completing 10 day course of 2000 mg /day, Lips swelling   Ibuprofen Other (See Comments)   Motrin ok with a GI effect. rash Motrin okay with a GI effect   Meperidine Hcl Nausea And Vomiting   Penicillins Hives, Rash        Medication List     TAKE these medications    acetaminophen 325 MG tablet Commonly known as: TYLENOL Take 2 tablets (650 mg total) by mouth every 6 (six) hours as needed for mild pain (pain score 1-3).   ALPRAZolam 0.5 MG tablet Commonly known as: XANAX TAKE FOUR TABLETS BY MOUTH DAILY AS NEEDED FOR ANXIETY What  changed: See the new instructions.   b complex vitamins capsule Take 1 capsule by mouth daily.   Carbidopa-Levodopa ER 25-100 MG tablet controlled release Commonly known as: SINEMET CR TAKE ONE TABLET BY MOUTH THREE TIMES A DAY   dicyclomine 10 MG capsule Commonly known as: BENTYL Take 1 capsule (10 mg total) by mouth every 6 (six) hours as needed for spasms. What changed: when to take this   feeding supplement Liqd Take 237 mLs by mouth 2 (two) times daily between meals.   FLUoxetine 10 MG capsule Commonly known as: PROzac Take 1 capsule (10 mg total) by mouth daily.   methocarbamol 500 MG tablet Commonly known as: ROBAXIN Take 1 tablet (500 mg total) by mouth every 6 (six) hours as needed for muscle spasms.   oxyCODONE 5 MG immediate release tablet Commonly known as: Oxy IR/ROXICODONE Take 1 tablet (5 mg total) by mouth every 6 (six) hours as needed for moderate pain (pain score 4-6).   polyethylene glycol 17 g packet Commonly known as: MiraLax Take 17 g by mouth daily as needed. What changed: when to take this   PROBIOTIC PO Take 1 capsule  by mouth 2 (two) times a week.   rivaroxaban 10 MG Tabs tablet Commonly known as: XARELTO Take 1 tablet (10 mg total) by mouth daily for 21 days.   Synthroid 25 MCG tablet Generic drug: levothyroxine TAKE 1 TABLET DAILY MONDAY THROUGH SATURDAY, SKIP SUNDAY FOR HYPOTHYROIDISM   VITAMIN D-3 PO Take 1 capsule by mouth 3 (three) times a week.        Contact information for follow-up providers     Joselyn Arrow, MD. Schedule an appointment as soon as possible for a visit in 1 week(s).   Specialty: Family Medicine Contact information: [address] [place] [address] [phone]         London Sheer, MD. Schedule an appointment as soon as possible for a visit in 1 week(s).   Specialty: Orthopedic Surgery Contact information: [address] [place] [address] [phone]              Contact  information for after-discharge care     Destination     [facility] .   Service: Skilled Nursing Contact information: [address] Update Address [place] [address] [phone]                    Allergies  Allergen Reactions   Iodinated Contrast Media Anaphylaxis   Iodine Anaphylaxis    IV and topical forms. Unknown   Levsin [Hyoscyamine Sulfate]     Vision problems/pt has glaucoma   Salmon [Fish Allergy] Hives and Shortness Of Breath   Shellfish Allergy Anaphylaxis   Tramadol Swelling   Remeron [Mirtazapine] Other (See Comments)    Cause blurred vision and red eyes, pt has glaucoma   Aspirin Other (See Comments)    Sever stomach pain due to ulcer scaring.   Bis Subcit-Metronid-Tetracyc Swelling    Tongue swelling. Face tingling    Cephalexin Hives   Ciprofloxacin Diarrhea   Codeine Nausea And Vomiting   Cyclobenzaprine Other (See Comments)    Tingly/prickly sensation.    Darvocet [Propoxyphene N-Acetaminophen] Nausea And Vomiting   Demerol [Meperidine] Nausea Only   Dexlansoprazole Swelling    Redness, swelling and peeling of both feet, foot pain   Diphedryl [Diphenhydramine] Other (See Comments)    Increased pulse/small amount ok   Doxycycline Hyclate Other (See Comments)    GI intolerance.   Doxycycline Hyclate      GI intolerance   Epinephrine Other (See Comments)    Breathing problems/fainting   Erythromycin Other (See Comments)    GI intolerance.    Fish Oil     breathing problems/hives   Hyoscyamine     eye pain   Latex Other (See Comments)    Gloves ok.  Skin gets red from elastic in underwear and latex bandaides.   Nitrofurantoin Diarrhea   Nsaids Other (See Comments)   Other     migraines: increased pulse, faint, diarrhea   Prednisone Other (See Comments)    Headache Other reaction(s): headache   Ra Diphedryl Allergy [Diphenhydramine Hcl]     Other reaction(s): increased pulse small dose  okay   Sertraline Hcl Swelling and Other (See Comments)    Migraine Swelling of tongue/lip (09/2012) Other reaction(s): Unknown   Shellfish-Derived Products     Other reaction(s): Unknown   Sulfa Antibiotics Other (See Comments)    Increased pulse, fainting, diarrhea, thrush   Wellbutrin [Bupropion] Other (See Comments)    Headaches   Xylocaine [Lidocaine Hcl]     With  epinephrine, given by dentist.  Speeded up heart rate and she passed out (occured twice, at dentist)   Xylocaine [Lidocaine]     Unknown   Biaxin [Clarithromycin] Rash    Started after completing 10 day course of 2000 mg /day, Lips swelling   Ibuprofen Other (See Comments)    Motrin ok with a GI effect. rash Motrin okay with a GI effect   Meperidine Hcl Nausea And Vomiting   Penicillins Hives and Rash     Other Procedures/Studies: DG Swallowing Func-Speech Pathology Result Date: 07/09/2023 Table formatting from the original result was not included. Modified Barium Swallow Study Patient Details Name: Tracy Malone MRN: [id number] Date of Birth: 1932-06-29 Today's Date: 07/09/2023 HPI/PMH: HPI: Tracy Malone is a 89 y.o. female with medical history significant of carotid stenosis, fibromyalgia, hypothyroidism, and Parkinson's who presented on 12/12 with a fall.  She has a R humerus and R hip fracture.  Patient has been on home hospice for FTT.  She got up today and had a mechanical fall, landing on R side and injuring both her R arm and leg. Pt underwent surgery for right femoral neck fracture on 12/14. Clinical Impression: Clinical Impression: Pt demonstrated moderate oropharyngeal dysphagia. Suspect pt had a baseline dysphagia that is now exacerbated with deconditioned state. She was able to maintain an orally cohesive bolus and transit briskly however her tongue base retraction and pharyngeal wall contraction and stripping wave were reduced leaving residue in posterior pharynx and valleculae that she frequently brought back  up into oral cavity and reswallowed. Material briefly entered area between soft palate and pharyngeal wall. She could achieve adequate laryngeal elevation but anterior excursion was reduced. Her epiglottic deflection was decreased reaching only horizontal position. Pt silently aspirated thin liquids during multiple swallows in attempts to clear diffuse pharyngeal residue. Cue for hard cough could not clear aspirate and and a chin tuck was ineffective to prevent aspiration. Nectar thick penetrated (PAS 3) with reflexive throat clear that cleared penetrate. Pt did aspirate nectar when using a straw from the pyriform sinus residue. Verbal cues for second swallow intermittently reduced pharyngeal residue to min-mild. Honey thick was silently aspirated from swallow of pyriform sinus residue. She was able to clear esophagus seen on esophageal scan. Recommend regular texture, nectar thick liquids, no straws, swallow twice, throat clear/cough during meals and crush meds. ST will continue to follow. Factors that may increase risk of adverse event in presence of aspiration Tracy Malone & Tracy Malone 2021): Factors that may increase risk of adverse event in presence of aspiration Tracy Malone & Tracy Malone 2021): Frail or deconditioned Recommendations/Plan: Swallowing Evaluation Recommendations Swallowing Evaluation Recommendations Recommendations: PO diet PO Diet Recommendation: Regular; Mildly thick liquids (Level 2, nectar thick) Liquid Administration via: No straw; Cup Medication Administration: Crushed with puree Supervision: Patient able to self-feed; Full supervision/cueing for swallowing strategies Swallowing strategies  : Slow rate; Small bites/sips; Multiple dry swallows after each bite/sip; Clear throat intermittently Postural changes: Position pt fully upright for meals Oral care recommendations: Oral care BID (2x/day) Caregiver Recommendations: Have oral suction available Treatment Plan Treatment Plan Treatment recommendations:  Therapy as outlined in treatment plan below Follow-up recommendations: Outpatient SLP Functional status assessment: Patient has had a recent decline in their functional status and demonstrates the ability to make significant improvements in function in a reasonable and predictable amount of time. Treatment frequency: Min 2x/week Treatment duration: 2 weeks Interventions: Aspiration precaution training; Oropharyngeal exercises; Compensatory techniques; Patient/family education; Trials of upgraded texture/liquids; Diet toleration management by  SLP Recommendations Recommendations for follow up therapy are one component of a multi-disciplinary discharge planning process, led by the attending physician.  Recommendations may be updated based on patient status, additional functional criteria and insurance authorization. Assessment: Orofacial Exam: Orofacial Exam Oral Cavity: Oral Hygiene: WFL Oral Cavity - Dentition: Adequate natural dentition Orofacial Anatomy: WFL Oral Motor/Sensory Function: WFL Anatomy: Anatomy: WFL Boluses Administered: Boluses Administered Boluses Administered: Thin liquids (Level 0); Mildly thick liquids (Level 2, nectar thick); Moderately thick liquids (Level 3, honey thick); Puree; Solid  Oral Impairment Domain: Oral Impairment Domain Lip Closure: No labial escape Tongue control during bolus hold: Cohesive bolus between tongue to palatal seal Bolus preparation/mastication: Timely and efficient chewing and mashing Bolus transport/lingual motion: Brisk tongue motion Oral residue: Trace residue lining oral structures Location of oral residue : Tongue Initiation of pharyngeal swallow : Valleculae  Pharyngeal Impairment Domain: Pharyngeal Impairment Domain Soft palate elevation: Trace column of contrast or air between SP and PW Laryngeal elevation: Complete superior movement of thyroid cartilage with complete approximation of arytenoids to epiglottic petiole Anterior hyoid excursion: Partial anterior  movement Epiglottic movement: Partial inversion Laryngeal vestibule closure: Incomplete, narrow column air/contrast in laryngeal vestibule Pharyngeal stripping wave : Present - diminished Pharyngeal contraction (A/P view only): N/A Pharyngoesophageal segment opening: Partial distention/partial duration, partial obstruction of flow Tongue base retraction: Wide column of contrast or air between tongue base and PPW Pharyngeal residue: Collection of residue within or on pharyngeal structures Location of pharyngeal residue: Valleculae; Pyriform sinuses  Esophageal Impairment Domain: Esophageal Impairment Domain Esophageal Tracy upright position: Complete Tracy, esophageal coating Pill: No data recorded Penetration/Aspiration Scale Score: Penetration/Aspiration Scale Score 1.  Material does not enter airway: Solid; Puree 2.  Material enters airway, remains ABOVE vocal cords then ejected out: Mildly thick liquids (Level 2, nectar thick) 3.  Material enters airway, remains ABOVE vocal cords and not ejected out: Moderately thick liquids (Level 3, honey thick); Thin liquids (Level 0) 8.  Material enters airway, passes BELOW cords without attempt by patient to eject out (silent aspiration) : Thin liquids (Level 0); Mildly thick liquids (Level 2, nectar thick) (nectar aspirated with a straw) Compensatory Strategies: Compensatory Strategies Compensatory strategies: Yes Straw: Ineffective Ineffective Straw: Mildly thick liquid (Level 2, nectar thick) Multiple swallows: Effective Chin tuck: Ineffective Ineffective Chin Tuck: Thin liquid (Level 0)   General Information: Caregiver present: No  Diet Prior to this Study: Regular; Thin liquids (Level 0); Other (Comment) (per MD)   Temperature : Normal   Respiratory Status: WFL   Supplemental O2: None (Room air)   History of Recent Intubation: No (not intubated for surgery per notes and from daughter)  Behavior/Cognition: Alert; Cooperative; Pleasant mood; Requires cueing  Self-Feeding Abilities: Able to self-feed Baseline vocal quality/speech: Dysphonic Volitional Cough: Able to elicit Volitional Swallow: Able to elicit Exam Limitations: No limitations Goal Planning: Prognosis for improved oropharyngeal function: -- (fair-good) No data recorded No data recorded Patient/Family Stated Goal: To eat/drink comfortably Consulted and agree with results and recommendations: Patient; Family member/caregiver Pain: Pain Assessment Pain Assessment: Faces Faces Pain Scale: 2 Pain Location: back Pain Descriptors / Indicators: Grimacing; Discomfort Pain Intervention(s): Monitored during session End of Session: Start Time:SLP Start Time (ACUTE ONLY): 1610 Stop Time: SLP Stop Time (ACUTE ONLY): 0950 Time Calculation:SLP Time Calculation (min) (ACUTE ONLY): 23 min Charges: SLP Evaluations $ SLP Speech Visit: 1 Visit SLP Evaluations $BSS Swallow: 1 Procedure $MBS Swallow: 1 Procedure SLP visit diagnosis: SLP Visit Diagnosis: Dysphagia, oropharyngeal phase (R13.12) Past Medical History:  Past Medical History: Diagnosis Date  Acute lower UTI 11/22/2018  Bell's palsy 1966  Hx: right side facial droop, resolved per patient 04/02/19  Carotid artery disease (HCC) 2010  on vascular screening;unchanged 2013.(could not tolerate simvastatin, no other statins tried)--<30% blockage bilat 07/2011  Claustrophobia   Depression   treated in the past for years;stopped in 2010 for a years  Duodenal ulcer 1962  h/o  Dysrhythmia   ocassional PVC's, no current problems per patient on 04/02/19  Fibromyalgia   chronic fatigue, chronic abdominal pain  Frequent PVCs 07/2012  Seen by [facility]: benign, asymptomatic, normal EF  Gallstones 02/2021  on [place]  GERD (gastroesophageal reflux disease)   diet controlled  Glaucoma, narrow-angle   s/p laser surgery  History of hiatal hernia   during endoscopy  Hypothyroid 03/2007  IBS (irritable bowel syndrome)   Dr. Elnoria Howard  Ischemic colitis (HCC) 11/21/2018  no current problems per patient on  04/02/19  Lichenoid keratosis 12/03/2020  Dr.Stinehelfer  Ocular migraine   Osteoporosis 04/2010  Dr.Hawkes; later consulted Dr. Sharl Ma (2022)  Panic attack   Parkinson's disease (HCC) 06/23/2016  Recurrent UTI   has cystocele-Dr.Grewal  Shingles 1999  h/o  Superficial thrombophlebitis 03/2009  RLE  Trochanteric bursitis 12/2008  bilateral Past Surgical History: Past Surgical History: Procedure Laterality Date  ABDOMINAL HYSTERECTOMY    BACK SURGERY  05/2019  CATARACT EXTRACTION, BILATERAL  1995, 1996  EYE SURGERY Bilateral   laser - glaucoma  Flexible sigmoidoscopy    FRACTURE SURGERY  2020, Sept.  12th (!)  KYPHOPLASTY N/A 04/03/2019  Procedure: KYPHOPLASTY L1;  Surgeon: Venita Lick, MD;  Location: [facility] OR;  Service: Orthopedics;  Laterality: N/A;  90 mins  RADIOFREQUENCY ABLATION Bilateral 11/14/2022  L3-4, L4-5 bilateral ( Neurosurg)  THYROIDECTOMY, PARTIAL  09/2005  L nodule; Dr. Gerrit Friends  TONSILLECTOMY  1946  UPPER GI ENDOSCOPY  06/27/2012  VAGINAL HYSTERECTOMY  1971  and bladder repair.  Still has ovaries  WISDOM TOOTH EXTRACTION   Tracy Malone 07/09/2023, 11:33 AM  DG Pelvis 1-2 Views Result Date: 07/07/2023 CLINICAL DATA:  Postoperative. Status post right proximal femoral ORIF. EXAM: PELVIS - 1-2 VIEW COMPARISON:  AP bilateral hips and lateral view of the right hip 07/05/2023 FINDINGS: There is diffuse decreased bone mineralization. Interval ORIF of the previously seen subcapital proximal femoral neck fracture with 3 screws. No hardware complication is seen. Mild bilateral superior femoroacetabular joint space narrowing and peripheral osteophytosis, similar to prior. Mild bilateral sacroiliac subchondral sclerosis. IMPRESSION: Interval ORIF of the previously seen subcapital proximal right femoral neck fracture. No hardware complication is seen. Electronically Signed   By: Neita Garnet M.D.   On: 07/07/2023 14:48   DG HIP UNILAT WITH PELVIS 2-3 VIEWS RIGHT Result Date: 07/07/2023 CLINICAL  DATA:  Elective surgery. Narrowing of right femoral neck fracture. Intraoperative fluoroscopy. EXAM: DG HIP (WITH OR WITHOUT PELVIS) 2-3V RIGHT COMPARISON:  Right hip and femur radiographs 07/05/2023 FINDINGS: Images were performed intraoperatively without the presence of a radiologist. There are 3 new screws fixating the previously seen right subcapital acute fracture. No hardware complication is seen. Total fluoroscopy images: 3 Total fluoroscopy time: 101 seconds Total dose: Radiation Exposure Index (as provided by the fluoroscopic device): 5.77 mGy air Kerma Please see intraoperative findings for further detail. IMPRESSION: Intraoperative fluoroscopy for right femoral neck fracture fixation. Electronically Signed   By: Neita Garnet M.D.   On: 07/07/2023 14:47   DG C-Arm 1-60 Min-No Report Result Date: 07/07/2023 Fluoroscopy was utilized by the  requesting physician.  No radiographic interpretation.   DG C-Arm 1-60 Min-No Report Result Date: 07/07/2023 Fluoroscopy was utilized by the requesting physician.  No radiographic interpretation.   CT Hip Right Wo Contrast Result Date: 07/05/2023 CLINICAL DATA:  Recent fall with leg pain, initial encounter EXAM: CT OF THE RIGHT HIP WITHOUT CONTRAST TECHNIQUE: Multidetector CT imaging of the right hip was performed according to the standard protocol. Multiplanar CT image reconstructions were also generated. RADIATION DOSE REDUCTION: This exam was performed according to the departmental dose-optimization program which includes automated exposure control, adjustment of the mA and/or kV according to patient size and/or use of iterative reconstruction technique. COMPARISON:  Films from earlier in the same day. FINDINGS: Bones/Joint/Cartilage There is an right impacted subcapital femoral neck fracture identified. No dislocation is seen. No intratrochanteric component is noted. No other fractures are seen. No joint effusion is noted. Ligaments Suboptimally assessed  by CT. Muscles and Tendons Surrounding musculature appears within normal limits. Soft tissues No soft tissue abnormality is noted. The visualized pelvic structures are within limits. IMPRESSION: Impacted subcapital femoral neck fracture on the right. Electronically Signed   By: Alcide Clever M.D.   On: 07/05/2023 23:59   DG HIP PORT UNILAT WITH PELVIS 1V RIGHT Result Date: 07/05/2023 CLINICAL DATA:  Fall, pain EXAM: DG HIP (WITH OR WITHOUT PELVIS) 1V PORT RIGHT COMPARISON:  None Available. FINDINGS: No definite femoral neck fracture seen on this single view. AP view on the pelvis series is concerning for impacted femoral neck fracture. No subluxation or dislocation. IMPRESSION: No definite fracture seen on this single view. Consider further evaluation with CT given the appearance on pelvis series. Electronically Signed   By: Charlett Nose M.D.   On: 07/05/2023 23:29   DG Shoulder Right Result Date: 07/05/2023 CLINICAL DATA:  Right shoulder pain, fall EXAM: RIGHT SHOULDER - 2+ VIEW COMPARISON:  None Available. FINDINGS: Proximal right humeral fracture involving the right humeral neck and greater tuberosity region. No subluxation or dislocation. IMPRESSION: Right humeral neck/head fracture. Electronically Signed   By: Charlett Nose M.D.   On: 07/05/2023 23:27   CT Cervical Spine Wo Contrast Result Date: 07/05/2023 CLINICAL DATA:  Neck trauma (Age >= 65y).  Fall. EXAM: CT CERVICAL SPINE WITHOUT CONTRAST TECHNIQUE: Multidetector CT imaging of the cervical spine was performed without intravenous contrast. Multiplanar CT image reconstructions were also generated. RADIATION DOSE REDUCTION: This exam was performed according to the departmental dose-optimization program which includes automated exposure control, adjustment of the mA and/or kV according to patient size and/or use of iterative reconstruction technique. COMPARISON:  None available FINDINGS: Alignment: Normal Skull base and vertebrae: No acute fracture.  No primary bone lesion or focal pathologic process. Soft tissues and spinal canal: No prevertebral fluid or swelling. No visible canal hematoma. Disc levels: Mild diffuse degenerative disc disease and facet disease. Upper chest: No acute findings Other: None IMPRESSION: No acute bony abnormality. Electronically Signed   By: Charlett Nose M.D.   On: 07/05/2023 23:23   CT Head Wo Contrast Result Date: 07/05/2023 CLINICAL DATA:  Head trauma, minor (Age >= 65y).  Fall. EXAM: CT HEAD WITHOUT CONTRAST TECHNIQUE: Contiguous axial images were obtained from the base of the skull through the vertex without intravenous contrast. RADIATION DOSE REDUCTION: This exam was performed according to the departmental dose-optimization program which includes automated exposure control, adjustment of the mA and/or kV according to patient size and/or use of iterative reconstruction technique. COMPARISON:  None Available. FINDINGS: Brain: There is atrophy and  chronic small vessel disease changes. No acute intracranial abnormality. Specifically, no hemorrhage, hydrocephalus, mass lesion, acute infarction, or significant intracranial injury. Vascular: No hyperdense vessel or unexpected calcification. Skull: No acute calvarial abnormality. Sinuses/Orbits: No acute findings Other: None IMPRESSION: Atrophy, chronic microvascular disease. No acute intracranial abnormality. Electronically Signed   By: Charlett Nose M.D.   On: 07/05/2023 23:21   DG Pelvis Portable Result Date: 07/05/2023 CLINICAL DATA:  Fall EXAM: PORTABLE PELVIS 1-2 VIEWS COMPARISON:  None Available. FINDINGS: There is foreshortening of the right femoral neck concerning for impacted fracture. No subluxation or dislocation. IMPRESSION: Concern for impacted right femoral neck fracture. Electronically Signed   By: Charlett Nose M.D.   On: 07/05/2023 23:09   DG Hand Complete Right Result Date: 07/05/2023 CLINICAL DATA:  Fall, right side pain EXAM: RIGHT HAND - COMPLETE 3+  VIEW COMPARISON:  None Available. 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 Diffuse osteopenia. No acute bony abnormality. Specifically, no fracture, subluxation, or dislocation. Soft tissues are intact. IMPRESSION: No acute bony abnormality. Electronically Signed   By: Charlett Nose M.D.   On: 07/05/2023 23:04   DG Forearm Right Result Date: 07/05/2023 CLINICAL DATA:  Fall, right side pain EXAM: RIGHT FOREARM - 2 VIEW COMPARISON:  None Available. FINDINGS: There is no evidence of fracture or other focal bone lesions. Soft tissues are unremarkable. IMPRESSION: Negative. Electronically Signed   By: Charlett Nose M.D.   On: 07/05/2023 23:04   DG Humerus Right Result Date: 07/05/2023 CLINICAL DATA:  Fall, right side pain EXAM: RIGHT HUMERUS - 2+ VIEW COMPARISON:  None Available. FINDINGS: There is a proximal right humeral fracture involving the greater tuberosity region of the humeral head. Extension across the humeral neck suspected but difficult to visualize. No subluxation or dislocation. IMPRESSION: Proximal right humeral fracture involving the greater tuberosity and possibly humeral neck. Electronically Signed   By: Charlett Nose M.D.   On: 07/05/2023 23:03   DG Femur 1 View Right Result Date: 07/05/2023 CLINICAL DATA:  Fall, pain EXAM: RIGHT FEMUR 1 VIEW COMPARISON:  None Available. FINDINGS: Limited single view of the right femur. No visible fracture, subluxation or dislocation. IMPRESSION: Limited study.  No visible acute bony abnormality. Electronically Signed   By: Charlett Nose M.D.   On: 07/05/2023 23:03   DG Chest Portable 1 View Result Date: 07/05/2023 CLINICAL DATA:  Fall EXAM: PORTABLE CHEST 1 VIEW COMPARISON:  01/30/2022 FINDINGS: Heart and mediastinal contours are within normal limits. No focal opacities or effusions. Proximal right humeral fracture in the region of the humeral neck/head. See humerus series report. IMPRESSION: No acute cardiopulmonary disease. Proximal right  humeral fracture. Electronically Signed   By: Charlett Nose M.D.   On: 07/05/2023 23:02     TODAY-DAY OF DISCHARGE:  Subjective:   Tracy Malone today has no headache,no chest abdominal pain,no new weakness tingling or numbness, feels much better wants to go home today.  Objective:   Blood pressure (!) 125/58, pulse 70, temperature 97.9 F (36.6 C), temperature source Axillary, resp. rate 17, height 4' 7.98" (1.422 m), weight 37 kg, SpO2 95%.  Intake/Output Summary (Last 24 hours) at 07/11/2023 0914 Last data filed at 07/11/2023 0746 Gross per 24 hour  Intake 2026.94 ml  Output 850 ml  Net 1176.94 ml   Filed Weights   07/06/23 1329 07/07/23 1016  Weight: 37 kg 37 kg    Exam: Awake Alert, Oriented *3, No new F.N deficits, Normal affect .AT,PERRAL Supple Neck,No JVD, No cervical lymphadenopathy  appriciated.  Symmetrical Chest wall movement, Good air movement bilaterally, CTAB RRR,No Gallops,Rubs or new Murmurs, No Parasternal Heave +ve B.Sounds, Abd Soft, Non tender, No organomegaly appriciated, No rebound -guarding or rigidity. No Cyanosis, Clubbing or edema, No new Rash or bruise   PERTINENT RADIOLOGIC STUDIES: DG Swallowing Func-Speech Pathology Result Date: 07/09/2023 Table formatting from the original result was not included. Modified Barium Swallow Study Patient Details Name: Tracy Malone MRN: [id number] Date of Birth: Apr 04, 1932 Today's Date: 07/09/2023 HPI/PMH: HPI: Tracy Malone is a 89 y.o. female with medical history significant of carotid stenosis, fibromyalgia, hypothyroidism, and Parkinson's who presented on 12/12 with a fall.  She has a R humerus and R hip fracture.  Patient has been on home hospice for FTT.  She got up today and had a mechanical fall, landing on R side and injuring both her R arm and leg. Pt underwent surgery for right femoral neck fracture on 12/14. Clinical Impression: Clinical Impression: Pt demonstrated moderate oropharyngeal dysphagia.  Suspect pt had a baseline dysphagia that is now exacerbated with deconditioned state. She was able to maintain an orally cohesive bolus and transit briskly however her tongue base retraction and pharyngeal wall contraction and stripping wave were reduced leaving residue in posterior pharynx and valleculae that she frequently brought back up into oral cavity and reswallowed. Material briefly entered area between soft palate and pharyngeal wall. She could achieve adequate laryngeal elevation but anterior excursion was reduced. Her epiglottic deflection was decreased reaching only horizontal position. Pt silently aspirated thin liquids during multiple swallows in attempts to clear diffuse pharyngeal residue. Cue for hard cough could not clear aspirate and and a chin tuck was ineffective to prevent aspiration. Nectar thick penetrated (PAS 3) with reflexive throat clear that cleared penetrate. Pt did aspirate nectar when using a straw from the pyriform sinus residue. Verbal cues for second swallow intermittently reduced pharyngeal residue to min-mild. Honey thick was silently aspirated from swallow of pyriform sinus residue. She was able to clear esophagus seen on esophageal scan. Recommend regular texture, nectar thick liquids, no straws, swallow twice, throat clear/cough during meals and crush meds. ST will continue to follow. Factors that may increase risk of adverse event in presence of aspiration Tracy Malone & Tracy Malone 2021): Factors that may increase risk of adverse event in presence of aspiration Tracy Malone & Tracy Malone 2021): Frail or deconditioned Recommendations/Plan: Swallowing Evaluation Recommendations Swallowing Evaluation Recommendations Recommendations: PO diet PO Diet Recommendation: Regular; Mildly thick liquids (Level 2, nectar thick) Liquid Administration via: No straw; Cup Medication Administration: Crushed with puree Supervision: Patient able to self-feed; Full supervision/cueing for swallowing strategies  Swallowing strategies  : Slow rate; Small bites/sips; Multiple dry swallows after each bite/sip; Clear throat intermittently Postural changes: Position pt fully upright for meals Oral care recommendations: Oral care BID (2x/day) Caregiver Recommendations: Have oral suction available Treatment Plan Treatment Plan Treatment recommendations: Therapy as outlined in treatment plan below Follow-up recommendations: Outpatient SLP Functional status assessment: Patient has had a recent decline in their functional status and demonstrates the ability to make significant improvements in function in a reasonable and predictable amount of time. Treatment frequency: Min 2x/week Treatment duration: 2 weeks Interventions: Aspiration precaution training; Oropharyngeal exercises; Compensatory techniques; Patient/family education; Trials of upgraded texture/liquids; Diet toleration management by SLP Recommendations Recommendations for follow up therapy are one component of a multi-disciplinary discharge planning process, led by the attending physician.  Recommendations may be updated based on patient status, additional functional criteria and insurance authorization. Assessment: Orofacial Exam:  Orofacial Exam Oral Cavity: Oral Hygiene: WFL Oral Cavity - Dentition: Adequate natural dentition Orofacial Anatomy: WFL Oral Motor/Sensory Function: WFL Anatomy: Anatomy: WFL Boluses Administered: Boluses Administered Boluses Administered: Thin liquids (Level 0); Mildly thick liquids (Level 2, nectar thick); Moderately thick liquids (Level 3, honey thick); Puree; Solid  Oral Impairment Domain: Oral Impairment Domain Lip Closure: No labial escape Tongue control during bolus hold: Cohesive bolus between tongue to palatal seal Bolus preparation/mastication: Timely and efficient chewing and mashing Bolus transport/lingual motion: Brisk tongue motion Oral residue: Trace residue lining oral structures Location of oral residue : Tongue Initiation of  pharyngeal swallow : Valleculae  Pharyngeal Impairment Domain: Pharyngeal Impairment Domain Soft palate elevation: Trace column of contrast or air between SP and PW Laryngeal elevation: Complete superior movement of thyroid cartilage with complete approximation of arytenoids to epiglottic petiole Anterior hyoid excursion: Partial anterior movement Epiglottic movement: Partial inversion Laryngeal vestibule closure: Incomplete, narrow column air/contrast in laryngeal vestibule Pharyngeal stripping wave : Present - diminished Pharyngeal contraction (A/P view only): N/A Pharyngoesophageal segment opening: Partial distention/partial duration, partial obstruction of flow Tongue base retraction: Wide column of contrast or air between tongue base and PPW Pharyngeal residue: Collection of residue within or on pharyngeal structures Location of pharyngeal residue: Valleculae; Pyriform sinuses  Esophageal Impairment Domain: Esophageal Impairment Domain Esophageal Tracy upright position: Complete Tracy, esophageal coating Pill: No data recorded Penetration/Aspiration Scale Score: Penetration/Aspiration Scale Score 1.  Material does not enter airway: Solid; Puree 2.  Material enters airway, remains ABOVE vocal cords then ejected out: Mildly thick liquids (Level 2, nectar thick) 3.  Material enters airway, remains ABOVE vocal cords and not ejected out: Moderately thick liquids (Level 3, honey thick); Thin liquids (Level 0) 8.  Material enters airway, passes BELOW cords without attempt by patient to eject out (silent aspiration) : Thin liquids (Level 0); Mildly thick liquids (Level 2, nectar thick) (nectar aspirated with a straw) Compensatory Strategies: Compensatory Strategies Compensatory strategies: Yes Straw: Ineffective Ineffective Straw: Mildly thick liquid (Level 2, nectar thick) Multiple swallows: Effective Chin tuck: Ineffective Ineffective Chin Tuck: Thin liquid (Level 0)   General Information: Caregiver present:  No  Diet Prior to this Study: Regular; Thin liquids (Level 0); Other (Comment) (per MD)   Temperature : Normal   Respiratory Status: WFL   Supplemental O2: None (Room air)   History of Recent Intubation: No (not intubated for surgery per notes and from daughter)  Behavior/Cognition: Alert; Cooperative; Pleasant mood; Requires cueing Self-Feeding Abilities: Able to self-feed Baseline vocal quality/speech: Dysphonic Volitional Cough: Able to elicit Volitional Swallow: Able to elicit Exam Limitations: No limitations Goal Planning: Prognosis for improved oropharyngeal function: -- (fair-good) No data recorded No data recorded Patient/Family Stated Goal: To eat/drink comfortably Consulted and agree with results and recommendations: Patient; Family member/caregiver Pain: Pain Assessment Pain Assessment: Faces Faces Pain Scale: 2 Pain Location: back Pain Descriptors / Indicators: Grimacing; Discomfort Pain Intervention(s): Monitored during session End of Session: Start Time:SLP Start Time (ACUTE ONLY): 0927 Stop Time: SLP Stop Time (ACUTE ONLY): 0950 Time Calculation:SLP Time Calculation (min) (ACUTE ONLY): 23 min Charges: SLP Evaluations $ SLP Speech Visit: 1 Visit SLP Evaluations $BSS Swallow: 1 Procedure $MBS Swallow: 1 Procedure SLP visit diagnosis: SLP Visit Diagnosis: Dysphagia, oropharyngeal phase (R13.12) Past Medical History: Past Medical History: Diagnosis Date  Acute lower UTI 11/22/2018  Bell's palsy 1966  Hx: right side facial droop, resolved per patient 04/02/19  Carotid artery disease (HCC) 2010  on vascular screening;unchanged 2013.(could not tolerate simvastatin, no  other statins tried)--<30% blockage bilat 07/2011  Claustrophobia   Depression   treated in the past for years;stopped in 2010 for a years  Duodenal ulcer 1962  h/o  Dysrhythmia   ocassional PVC's, no current problems per patient on 04/02/19  Fibromyalgia   chronic fatigue, chronic abdominal pain  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 Elnoria Howard  Ischemic colitis (HCC) 11/21/2018  no current problems per patient on 04/02/19  Lichenoid keratosis 12/03/2020  Dr.Stinehelfer  Ocular migraine   Osteoporosis 04/2010  Dr.Hawkes; later consulted Dr. Sharl Ma (2022)  Panic attack   Parkinson's disease (HCC) 06/23/2016  Recurrent UTI   has cystocele-Dr.Grewal  Shingles 1999  h/o  Superficial thrombophlebitis 03/2009  RLE  Trochanteric bursitis 12/2008  bilateral Past Surgical History: Past Surgical History: Procedure Laterality Date  ABDOMINAL HYSTERECTOMY    BACK SURGERY  05/2019  CATARACT EXTRACTION, BILATERAL  1995, 1996  EYE SURGERY Bilateral   laser - glaucoma  Flexible sigmoidoscopy    FRACTURE SURGERY  2020, Sept.  12th (!)  KYPHOPLASTY N/A 04/03/2019  Procedure: KYPHOPLASTY L1;  Surgeon: Venita Lick, MD;  Location: [facility] OR;  Service: Orthopedics;  Laterality: N/A;  90 mins  RADIOFREQUENCY ABLATION Bilateral 11/14/2022  L3-4, L4-5 bilateral ( [facility])  THYROIDECTOMY, PARTIAL  09/2005  L nodule; Dr. Gerrit Friends  TONSILLECTOMY  1946  UPPER GI ENDOSCOPY  06/27/2012  VAGINAL HYSTERECTOMY  1971  and bladder repair.  Still has ovaries  WISDOM TOOTH EXTRACTION   Tracy Malone 07/09/2023, 11:33 AM    PERTINENT LAB RESULTS: CBC: Recent Labs    07/09/23 0428  WBC 14.5*  HGB 12.2  HCT 38.9  PLT 141*   CMET CMP     Component Value Date/Time   NA 137 07/09/2023 0428   NA 142 05/01/2023 1405   K 3.3 (L) 07/09/2023 0428   CL 108 07/09/2023 0428   CO2 23 07/09/2023 0428   GLUCOSE 98 07/09/2023 0428   BUN 8 07/09/2023 0428   BUN 14 05/01/2023 1405   CREATININE 0.57 07/09/2023 0428   CREATININE 0.75 10/22/2018 1401   CREATININE 0.67 11/23/2016 0815   CALCIUM 8.2 (L) 07/09/2023  0428   PROT 6.9 05/24/2023 1104   PROT 6.5 05/01/2023 1405   ALBUMIN 4.4 05/24/2023 1104   ALBUMIN 4.2 05/01/2023 1405   AST 21 05/24/2023 1104   AST 17 10/22/2018 1401   ALT 13 05/24/2023 1104   ALT 7 10/22/2018 1401   ALKPHOS 51 05/24/2023 1104   BILITOT 1.4 (H) 05/24/2023 1104   BILITOT 0.8 05/01/2023 1405   BILITOT 0.8 10/22/2018 1401   GFR 79.36 09/23/2021 1318   EGFR 86 05/01/2023 1405   GFRNONAA >60 07/09/2023 0428   GFRNONAA >60 10/22/2018 1401    GFR Estimated Creatinine Tracy: 27.3 mL/min (by C-G formula based on SCr of 0.57 mg/dL). No results for input(s): "LIPASE", "AMYLASE" in the last 72 hours. No results for input(s): "CKTOTAL", "CKMB", "CKMBINDEX", "TROPONINI" in the last 72 hours. Invalid input(s): "POCBNP" No results for input(s): "DDIMER" in the last 72 hours. No results for input(s): "HGBA1C" in the last 72 hours. No results for input(s): "CHOL", "HDL", "LDLCALC", "TRIG", "CHOLHDL", "LDLDIRECT" in the last 72 hours. No results for input(s): "  TSH", "T4TOTAL", "T3FREE", "THYROIDAB" in the last 72 hours.  Invalid input(s): "FREET3" No results for input(s): "VITAMINB12", "FOLATE", "FERRITIN", "TIBC", "IRON", "RETICCTPCT" in the last 72 hours. Coags: No results for input(s): "INR" in the last 72 hours.  Invalid input(s): "PT" Microbiology: No results found for this or any previous visit (from the past 240 hours).  FURTHER DISCHARGE INSTRUCTIONS:  Get Medicines reviewed and adjusted: Please take all your medications with you for your next visit with your Primary MD  Laboratory/radiological data: Please request your Primary MD to go over all hospital tests and procedure/radiological results at the follow up, please ask your Primary MD to get all Hospital records sent to his/her office.  In some cases, they will be blood work, cultures and biopsy results pending at the time of your discharge. Please request that your primary care M.D. goes through all the  records of your hospital data and follows up on these results.  Also Note the following: If you experience worsening of your admission symptoms, develop shortness of breath, life threatening emergency, suicidal or homicidal thoughts you must seek medical attention immediately by calling 911 or calling your MD immediately  if symptoms less severe.  You must read complete instructions/literature along with all the possible adverse reactions/side effects for all the Medicines you take and that have been prescribed to you. Take any new Medicines after you have completely understood and accpet all the possible adverse reactions/side effects.   Do not drive when taking Pain medications or sleeping medications (Benzodaizepines)  Do not take more than prescribed Pain, Sleep and Anxiety Medications. It is not advisable to combine anxiety,sleep and pain medications without talking with your primary care practitioner  Special Instructions: If you have smoked or chewed Tobacco  in the last 2 yrs please stop smoking, stop any regular Alcohol  and or any Recreational drug use.  Wear Seat belts while driving.  Please note: You were cared for by a hospitalist during your hospital stay. Once you are discharged, your primary care physician will handle any further medical issues. Please note that NO REFILLS for any discharge medications will be authorized once you are discharged, as it is imperative that you return to your primary care physician (or establish a relationship with a primary care physician if you do not have one) for your post hospital discharge needs so that they can reassess your need for medications and monitor your lab values.  Total Time spent coordinating discharge including counseling, education and face to face time equals greater than 30 minutes.  SignedJeoffrey Massed 07/11/2023 9:14 AM

## 2023-07-11 NOTE — Progress Notes (Signed)
Discharge instructions (including medications) placed in discharge facility packet.  (AVS provided with instruction in D/C packet).

## 2023-07-11 NOTE — Plan of Care (Signed)

## 2023-07-12 ENCOUNTER — Telehealth: Payer: Self-pay | Admitting: Neurology

## 2023-07-12 DIAGNOSIS — R1312 Dysphagia, oropharyngeal phase: Secondary | ICD-10-CM | POA: Diagnosis not present

## 2023-07-12 DIAGNOSIS — G20A1 Parkinson's disease without dyskinesia, without mention of fluctuations: Secondary | ICD-10-CM | POA: Diagnosis not present

## 2023-07-12 DIAGNOSIS — S42201D Unspecified fracture of upper end of right humerus, subsequent encounter for fracture with routine healing: Secondary | ICD-10-CM | POA: Diagnosis not present

## 2023-07-12 DIAGNOSIS — S72001D Fracture of unspecified part of neck of right femur, subsequent encounter for closed fracture with routine healing: Secondary | ICD-10-CM | POA: Diagnosis not present

## 2023-07-12 NOTE — Telephone Encounter (Signed)
 [facility] confirmation

## 2023-07-16 ENCOUNTER — Ambulatory Visit: Payer: Medicare Other | Admitting: Neurology

## 2023-07-16 ENCOUNTER — Inpatient Hospital Stay (HOSPITAL_COMMUNITY)
Admission: EM | Admit: 2023-07-16 | Discharge: 2023-07-25 | DRG: 177 | Disposition: E | Payer: Medicare Other | Source: Skilled Nursing Facility | Attending: Internal Medicine | Admitting: Internal Medicine

## 2023-07-16 ENCOUNTER — Emergency Department (HOSPITAL_COMMUNITY): Payer: Medicare Other

## 2023-07-16 ENCOUNTER — Encounter (HOSPITAL_COMMUNITY): Payer: Self-pay

## 2023-07-16 ENCOUNTER — Telehealth: Payer: Self-pay | Admitting: Neurology

## 2023-07-16 ENCOUNTER — Other Ambulatory Visit: Payer: Self-pay

## 2023-07-16 DIAGNOSIS — K219 Gastro-esophageal reflux disease without esophagitis: Secondary | ICD-10-CM | POA: Diagnosis present

## 2023-07-16 DIAGNOSIS — Z91040 Latex allergy status: Secondary | ICD-10-CM

## 2023-07-16 DIAGNOSIS — F0284 Dementia in other diseases classified elsewhere, unspecified severity, with anxiety: Secondary | ICD-10-CM | POA: Diagnosis present

## 2023-07-16 DIAGNOSIS — S72001A Fracture of unspecified part of neck of right femur, initial encounter for closed fracture: Secondary | ICD-10-CM | POA: Diagnosis present

## 2023-07-16 DIAGNOSIS — R627 Adult failure to thrive: Secondary | ICD-10-CM | POA: Diagnosis present

## 2023-07-16 DIAGNOSIS — F411 Generalized anxiety disorder: Secondary | ICD-10-CM | POA: Diagnosis not present

## 2023-07-16 DIAGNOSIS — K922 Gastrointestinal hemorrhage, unspecified: Secondary | ICD-10-CM | POA: Diagnosis not present

## 2023-07-16 DIAGNOSIS — Z888 Allergy status to other drugs, medicaments and biological substances status: Secondary | ICD-10-CM

## 2023-07-16 DIAGNOSIS — Z882 Allergy status to sulfonamides status: Secondary | ICD-10-CM

## 2023-07-16 DIAGNOSIS — D72829 Elevated white blood cell count, unspecified: Secondary | ICD-10-CM | POA: Diagnosis present

## 2023-07-16 DIAGNOSIS — Z886 Allergy status to analgesic agent status: Secondary | ICD-10-CM

## 2023-07-16 DIAGNOSIS — M797 Fibromyalgia: Secondary | ICD-10-CM | POA: Diagnosis present

## 2023-07-16 DIAGNOSIS — Z885 Allergy status to narcotic agent status: Secondary | ICD-10-CM

## 2023-07-16 DIAGNOSIS — G252 Other specified forms of tremor: Secondary | ICD-10-CM | POA: Diagnosis present

## 2023-07-16 DIAGNOSIS — M810 Age-related osteoporosis without current pathological fracture: Secondary | ICD-10-CM | POA: Diagnosis present

## 2023-07-16 DIAGNOSIS — E039 Hypothyroidism, unspecified: Secondary | ICD-10-CM | POA: Diagnosis present

## 2023-07-16 DIAGNOSIS — Z8711 Personal history of peptic ulcer disease: Secondary | ICD-10-CM

## 2023-07-16 DIAGNOSIS — Z9889 Other specified postprocedural states: Secondary | ICD-10-CM | POA: Diagnosis not present

## 2023-07-16 DIAGNOSIS — Z79899 Other long term (current) drug therapy: Secondary | ICD-10-CM

## 2023-07-16 DIAGNOSIS — I959 Hypotension, unspecified: Secondary | ICD-10-CM | POA: Diagnosis present

## 2023-07-16 DIAGNOSIS — Z66 Do not resuscitate: Secondary | ICD-10-CM | POA: Diagnosis present

## 2023-07-16 DIAGNOSIS — Z9842 Cataract extraction status, left eye: Secondary | ICD-10-CM

## 2023-07-16 DIAGNOSIS — M79603 Pain in arm, unspecified: Secondary | ICD-10-CM | POA: Diagnosis not present

## 2023-07-16 DIAGNOSIS — R059 Cough, unspecified: Secondary | ICD-10-CM | POA: Diagnosis not present

## 2023-07-16 DIAGNOSIS — R1312 Dysphagia, oropharyngeal phase: Secondary | ICD-10-CM | POA: Diagnosis not present

## 2023-07-16 DIAGNOSIS — Z681 Body mass index (BMI) 19 or less, adult: Secondary | ICD-10-CM

## 2023-07-16 DIAGNOSIS — Z515 Encounter for palliative care: Secondary | ICD-10-CM

## 2023-07-16 DIAGNOSIS — Z8619 Personal history of other infectious and parasitic diseases: Secondary | ICD-10-CM

## 2023-07-16 DIAGNOSIS — S42301D Unspecified fracture of shaft of humerus, right arm, subsequent encounter for fracture with routine healing: Secondary | ICD-10-CM

## 2023-07-16 DIAGNOSIS — R0902 Hypoxemia: Secondary | ICD-10-CM | POA: Diagnosis not present

## 2023-07-16 DIAGNOSIS — D62 Acute posthemorrhagic anemia: Secondary | ICD-10-CM | POA: Diagnosis not present

## 2023-07-16 DIAGNOSIS — J168 Pneumonia due to other specified infectious organisms: Secondary | ICD-10-CM | POA: Diagnosis not present

## 2023-07-16 DIAGNOSIS — Z9841 Cataract extraction status, right eye: Secondary | ICD-10-CM

## 2023-07-16 DIAGNOSIS — F02811 Dementia in other diseases classified elsewhere, unspecified severity, with agitation: Secondary | ICD-10-CM | POA: Diagnosis present

## 2023-07-16 DIAGNOSIS — G20A1 Parkinson's disease without dyskinesia, without mention of fluctuations: Secondary | ICD-10-CM | POA: Diagnosis present

## 2023-07-16 DIAGNOSIS — Z881 Allergy status to other antibiotic agents status: Secondary | ICD-10-CM

## 2023-07-16 DIAGNOSIS — D638 Anemia in other chronic diseases classified elsewhere: Secondary | ICD-10-CM | POA: Diagnosis present

## 2023-07-16 DIAGNOSIS — I2510 Atherosclerotic heart disease of native coronary artery without angina pectoris: Secondary | ICD-10-CM | POA: Diagnosis not present

## 2023-07-16 DIAGNOSIS — J9601 Acute respiratory failure with hypoxia: Secondary | ICD-10-CM | POA: Diagnosis present

## 2023-07-16 DIAGNOSIS — R1032 Left lower quadrant pain: Secondary | ICD-10-CM | POA: Diagnosis not present

## 2023-07-16 DIAGNOSIS — R0600 Dyspnea, unspecified: Secondary | ICD-10-CM | POA: Diagnosis not present

## 2023-07-16 DIAGNOSIS — D75839 Thrombocytosis, unspecified: Secondary | ICD-10-CM | POA: Diagnosis present

## 2023-07-16 DIAGNOSIS — Z79890 Hormone replacement therapy (postmenopausal): Secondary | ICD-10-CM

## 2023-07-16 DIAGNOSIS — K30 Functional dyspepsia: Secondary | ICD-10-CM | POA: Diagnosis not present

## 2023-07-16 DIAGNOSIS — Z7901 Long term (current) use of anticoagulants: Secondary | ICD-10-CM

## 2023-07-16 DIAGNOSIS — Z8672 Personal history of thrombophlebitis: Secondary | ICD-10-CM

## 2023-07-16 DIAGNOSIS — J690 Pneumonitis due to inhalation of food and vomit: Secondary | ICD-10-CM | POA: Diagnosis present

## 2023-07-16 DIAGNOSIS — Z8249 Family history of ischemic heart disease and other diseases of the circulatory system: Secondary | ICD-10-CM

## 2023-07-16 DIAGNOSIS — S72001D Fracture of unspecified part of neck of right femur, subsequent encounter for closed fracture with routine healing: Secondary | ICD-10-CM

## 2023-07-16 DIAGNOSIS — Z91013 Allergy to seafood: Secondary | ICD-10-CM

## 2023-07-16 DIAGNOSIS — Z90710 Acquired absence of both cervix and uterus: Secondary | ICD-10-CM

## 2023-07-16 DIAGNOSIS — Z880 Allergy status to penicillin: Secondary | ICD-10-CM

## 2023-07-16 DIAGNOSIS — M8580 Other specified disorders of bone density and structure, unspecified site: Secondary | ICD-10-CM | POA: Diagnosis present

## 2023-07-16 DIAGNOSIS — Z87892 Personal history of anaphylaxis: Secondary | ICD-10-CM

## 2023-07-16 DIAGNOSIS — J189 Pneumonia, unspecified organism: Secondary | ICD-10-CM | POA: Diagnosis not present

## 2023-07-16 DIAGNOSIS — Z801 Family history of malignant neoplasm of trachea, bronchus and lung: Secondary | ICD-10-CM

## 2023-07-16 DIAGNOSIS — Z87440 Personal history of urinary (tract) infections: Secondary | ICD-10-CM

## 2023-07-16 DIAGNOSIS — W19XXXD Unspecified fall, subsequent encounter: Secondary | ICD-10-CM | POA: Diagnosis present

## 2023-07-16 DIAGNOSIS — R5382 Chronic fatigue, unspecified: Secondary | ICD-10-CM | POA: Diagnosis present

## 2023-07-16 DIAGNOSIS — Z91041 Radiographic dye allergy status: Secondary | ICD-10-CM

## 2023-07-16 DIAGNOSIS — Z8261 Family history of arthritis: Secondary | ICD-10-CM

## 2023-07-16 DIAGNOSIS — M7989 Other specified soft tissue disorders: Secondary | ICD-10-CM | POA: Diagnosis not present

## 2023-07-16 DIAGNOSIS — R918 Other nonspecific abnormal finding of lung field: Secondary | ICD-10-CM | POA: Diagnosis not present

## 2023-07-16 DIAGNOSIS — Z833 Family history of diabetes mellitus: Secondary | ICD-10-CM

## 2023-07-16 HISTORY — DX: Hypoxemia: R09.02

## 2023-07-16 LAB — COMPREHENSIVE METABOLIC PANEL
ALT: 7 U/L (ref 0–44)
AST: 21 U/L (ref 15–41)
Albumin: 3 g/dL — ABNORMAL LOW (ref 3.5–5.0)
Alkaline Phosphatase: 67 U/L (ref 38–126)
Anion gap: 9 (ref 5–15)
BUN: 17 mg/dL (ref 8–23)
CO2: 28 mmol/L (ref 22–32)
Calcium: 8.6 mg/dL — ABNORMAL LOW (ref 8.9–10.3)
Chloride: 107 mmol/L (ref 98–111)
Creatinine, Ser: 0.38 mg/dL — ABNORMAL LOW (ref 0.44–1.00)
GFR, Estimated: >60 mL/min (ref >60)
Glucose, Bld: 126 mg/dL — ABNORMAL HIGH (ref 70–99)
Potassium: 3.6 mmol/L (ref 3.5–5.1)
Sodium: 144 mmol/L (ref 135–145)
Total Bilirubin: 1.2 mg/dL — ABNORMAL HIGH (ref <1.2)
Total Protein: 6.2 g/dL — ABNORMAL LOW (ref 6.5–8.1)

## 2023-07-16 LAB — CBC
HCT: 37.5 % (ref 36.0–46.0)
Hemoglobin: 11.8 g/dL — ABNORMAL LOW (ref 12.0–15.0)
MCH: 30.2 pg (ref 26.0–34.0)
MCHC: 31.5 g/dL (ref 30.0–36.0)
MCV: 95.9 fL (ref 80.0–100.0)
Platelets: 524 10*3/uL — ABNORMAL HIGH (ref 150–400)
RBC: 3.91 MIL/uL (ref 3.87–5.11)
RDW: 13.8 % (ref 11.5–15.5)
WBC: 17.3 10*3/uL — ABNORMAL HIGH (ref 4.0–10.5)
nRBC: 0 % (ref 0.0–0.2)

## 2023-07-16 LAB — TROPONIN I (HIGH SENSITIVITY): Troponin I (High Sensitivity): 9 ng/L (ref <18)

## 2023-07-16 LAB — CBG MONITORING, ED: Glucose-Capillary: 111 mg/dL — ABNORMAL HIGH (ref 70–99)

## 2023-07-16 MED ORDER — LACTATED RINGERS BOLUS PEDS
500.0000 mL | INTRAVENOUS | Status: AC
  Administered 2023-07-16: 500 mL via INTRAVENOUS

## 2023-07-16 MED ORDER — CALCIUM CARBONATE ANTACID 500 MG PO CHEW
1.0000 | CHEWABLE_TABLET | Freq: Two times a day (BID) | ORAL | Status: DC
Start: 2023-07-16 — End: 2023-07-19
  Administered 2023-07-16 – 2023-07-19 (×5): 200 mg via ORAL
  Filled 2023-07-16 (×6): qty 1

## 2023-07-16 MED ORDER — ALPRAZOLAM 0.5 MG PO TABS
0.5000 mg | ORAL_TABLET | Freq: Once | ORAL | Status: AC
Start: 2023-07-16 — End: 2023-07-16
  Administered 2023-07-16: 0.5 mg via ORAL
  Filled 2023-07-16: qty 1

## 2023-07-16 MED ORDER — OXYCODONE HCL 5 MG PO TABS
5.0000 mg | ORAL_TABLET | ORAL | Status: DC | PRN
  Administered 2023-07-16 – 2023-07-19 (×8): 5 mg via ORAL
  Filled 2023-07-16 (×8): qty 1

## 2023-07-16 MED ORDER — CARBIDOPA-LEVODOPA ER 25-100 MG PO TBCR
1.0000 | EXTENDED_RELEASE_TABLET | Freq: Three times a day (TID) | ORAL | Status: DC
Start: 2023-07-16 — End: 2023-07-16
  Filled 2023-07-16 (×2): qty 1

## 2023-07-16 MED ORDER — METRONIDAZOLE 500 MG/100ML IV SOLN
500.0000 mg | Freq: Two times a day (BID) | INTRAVENOUS | Status: DC
  Administered 2023-07-16 – 2023-07-17 (×2): 500 mg via INTRAVENOUS
  Filled 2023-07-16 (×2): qty 100

## 2023-07-16 MED ORDER — POLYETHYLENE GLYCOL 3350 17 G PO PACK: 17.0000 g | PACK | Freq: Every day | ORAL | Status: DC | PRN

## 2023-07-16 MED ORDER — VANCOMYCIN HCL 750 MG/150ML IV SOLN: 750.0000 mg | INTRAVENOUS | Status: DC

## 2023-07-16 MED ORDER — SODIUM CHLORIDE 0.9% FLUSH
3.0000 mL | Freq: Two times a day (BID) | INTRAVENOUS | Status: DC
  Administered 2023-07-16 – 2023-07-19 (×5): 3 mL via INTRAVENOUS

## 2023-07-16 MED ORDER — RIVAROXABAN 10 MG PO TABS
10.0000 mg | ORAL_TABLET | Freq: Every day | ORAL | Status: DC
  Administered 2023-07-17 – 2023-07-18 (×2): 10 mg via ORAL
  Filled 2023-07-16 (×3): qty 1

## 2023-07-16 MED ORDER — ALPRAZOLAM 0.5 MG PO TABS
0.5000 mg | ORAL_TABLET | Freq: Four times a day (QID) | ORAL | Status: DC | PRN
  Administered 2023-07-16 – 2023-07-19 (×8): 0.5 mg via ORAL
  Filled 2023-07-16 (×10): qty 1

## 2023-07-16 MED ORDER — FOOD THICKENER (SIMPLYTHICK)
1.0000 | ORAL | Status: DC | PRN
  Administered 2023-07-16: 1 via ORAL
  Filled 2023-07-16 (×2): qty 1

## 2023-07-16 MED ORDER — LEVOFLOXACIN IN D5W 750 MG/150ML IV SOLN: 750.0000 mg | INTRAVENOUS | Status: DC

## 2023-07-16 MED ORDER — VANCOMYCIN HCL 750 MG/150ML IV SOLN
750.0000 mg | Freq: Once | INTRAVENOUS | Status: AC
  Administered 2023-07-16: 750 mg via INTRAVENOUS
  Filled 2023-07-16: qty 150

## 2023-07-16 MED ORDER — HYDROCODONE-ACETAMINOPHEN 7.5-325 MG/15ML PO SOLN
10.0000 mL | Freq: Once | ORAL | Status: AC
  Administered 2023-07-16: 10 mL via ORAL
  Filled 2023-07-16: qty 15

## 2023-07-16 MED ORDER — SENNOSIDES-DOCUSATE SODIUM 8.6-50 MG PO TABS
2.0000 | ORAL_TABLET | Freq: Two times a day (BID) | ORAL | Status: DC
  Administered 2023-07-16 – 2023-07-18 (×4): 2 via ORAL
  Filled 2023-07-16 (×5): qty 2

## 2023-07-16 MED ORDER — ALPRAZOLAM 0.5 MG PO TABS: 0.5000 mg | ORAL_TABLET | Freq: Three times a day (TID) | ORAL | Status: DC | PRN

## 2023-07-16 MED ORDER — FENTANYL CITRATE PF 50 MCG/ML IJ SOSY
12.5000 ug | PREFILLED_SYRINGE | Freq: Once | INTRAMUSCULAR | Status: AC
  Administered 2023-07-16: 12.5 ug via INTRAVENOUS
  Filled 2023-07-16: qty 1

## 2023-07-16 MED ORDER — LEVOFLOXACIN IN D5W 750 MG/150ML IV SOLN
750.0000 mg | Freq: Once | INTRAVENOUS | Status: AC
  Administered 2023-07-16: 750 mg via INTRAVENOUS
  Filled 2023-07-16: qty 150

## 2023-07-16 MED ORDER — LEVOTHYROXINE SODIUM 25 MCG PO TABS
25.0000 ug | ORAL_TABLET | Freq: Every day | ORAL | Status: DC
  Administered 2023-07-17 – 2023-07-18 (×2): 25 ug via ORAL
  Filled 2023-07-16: qty 1

## 2023-07-16 MED ORDER — CARBIDOPA-LEVODOPA 25-100 MG PO TABS
1.0000 | ORAL_TABLET | Freq: Three times a day (TID) | ORAL | Status: DC
  Administered 2023-07-16 – 2023-07-19 (×8): 1 via ORAL
  Filled 2023-07-16 (×10): qty 1

## 2023-07-16 NOTE — ED Provider Notes (Signed)
[place] EMERGENCY DEPARTMENT AT [facility] Provider Note   CSN: [id number] Arrival date & time: 07/16/23  1110     History  Chief Complaint  Patient presents with   Agitation    Tracy Malone is a 89 y.o. female.  The history is provided by the patient and the EMS personnel.  Patient presents to the ED with a 1 day history of decreased urine output and agitation per SNF personnel and EMS.  Patient has a prior medical history of Parkinson's on home hospice for failure to thrive, carotid stenosis, fibromyalgia, hypothyroidism, active right humerus fracture and right hip fracture with status post percutaneous screw fixation on 07/07/2023.  On Xarelto for VTE prophylaxis.  Scheduled to have follow-up with orthopedics.  Currently complaining of being agitated due to not being given her anxiety medication.  Using fluoxetine and Xanax.  Family states that she is dependent upon Xanax and will get incredibly agitated if not regularly provided.  She had a swallow study send she can take nectar thick liquids and usually takes her Xanax in pudding.  She is also complaining of a worsening cough coughing up darker colored phlegm.  On 5 L at home of O2.  She says that she also has felt lightheaded since arriving to the ED.  Denies fevers, chills, chest pain, shortness of breath, abdominal pain.  Endorsing pain to right arm and right hip due to injury post fall.  Previously discharged on 07/05/2023 after fall.  She states that she "does not want to live like this and just wants to die and meet the Lord."  Denies plan.     Home Medications Prior to Admission medications   Medication Sig Start Date End Date Taking? Authorizing Provider  acetaminophen (TYLENOL) 325 MG tablet Take 2 tablets (650 mg total) by mouth every 6 (six) hours as needed for mild pain (pain score 1-3). 07/11/23   Ghimire, Werner Lean, MD  ALPRAZolam Prudy Feeler) 0.5 MG tablet TAKE FOUR TABLETS BY MOUTH DAILY AS NEEDED FOR  ANXIETY 07/11/23   Ghimire, Werner Lean, MD  b complex vitamins capsule Take 1 capsule by mouth daily.    [provider]  Carbidopa-Levodopa ER (SINEMET CR) 25-100 MG tablet controlled release TAKE ONE TABLET BY MOUTH THREE TIMES A DAY 06/28/23   Sater, Pearletha Furl, MD  Cholecalciferol (VITAMIN D-3 PO) Take 1 capsule by mouth 3 (three) times a week.    [provider]  dicyclomine (BENTYL) 10 MG capsule Take 1 capsule (10 mg total) by mouth every 6 (six) hours as needed for spasms. Patient taking differently: Take 10 mg by mouth at bedtime as needed for spasms. 05/29/23   Cirigliano, Vito V, DO  feeding supplement (ENSURE ENLIVE / ENSURE PLUS) LIQD Take 237 mLs by mouth 2 (two) times daily between meals. 07/11/23   Ghimire, Werner Lean, MD  FLUoxetine (PROZAC) 10 MG capsule Take 1 capsule (10 mg total) by mouth daily. 05/10/23   Mozingo, Thereasa Solo, NP  methocarbamol (ROBAXIN) 500 MG tablet Take 1 tablet (500 mg total) by mouth every 6 (six) hours as needed for muscle spasms. 07/11/23   Ghimire, Werner Lean, MD  oxyCODONE (OXY IR/ROXICODONE) 5 MG immediate release tablet Take 1 tablet (5 mg total) by mouth every 6 (six) hours as needed for moderate pain (pain score 4-6). 07/11/23   Ghimire, Werner Lean, MD  polyethylene glycol (MIRALAX) 17 g packet Take 17 g by mouth daily as needed. Patient taking differently: Take 17 g  by mouth daily. 04/20/22   Gareth Eagle, PA-C  Probiotic Product (PROBIOTIC PO) Take 1 capsule by mouth 2 (two) times a week.    [provider]  rivaroxaban (XARELTO) 10 MG TABS tablet Take 1 tablet (10 mg total) by mouth daily for 21 days. 07/11/23 08/01/23  Ghimire, Werner Lean, MD  SYNTHROID 25 MCG tablet TAKE 1 TABLET DAILY MONDAY THROUGH [place], SKIP SUNDAY FOR HYPOTHYROIDISM 03/15/23   Joselyn Arrow, MD      Allergies    Iodinated contrast media, Iodine, Levsin [hyoscyamine sulfate], Salmon [fish allergy], Shellfish allergy, Tramadol, Remeron [mirtazapine],  Aspirin, Bis subcit-metronid-tetracyc, Cephalexin, Ciprofloxacin, Codeine, Cyclobenzaprine, Darvocet [propoxyphene n-acetaminophen], Demerol [meperidine], Dexlansoprazole, Diphedryl [diphenhydramine], Doxycycline hyclate, Doxycycline hyclate, Epinephrine, Erythromycin, Fish oil, Hyoscyamine, Latex, Nitrofurantoin, Nsaids, Other, Prednisone, Ra diphedryl allergy [diphenhydramine hcl], Sertraline hcl, Shellfish-derived products, Sulfa antibiotics, Wellbutrin [bupropion], Xylocaine [lidocaine hcl], Xylocaine [lidocaine], Biaxin [clarithromycin], Ibuprofen, Meperidine hcl, and Penicillins    Review of Systems   Review of Systems  Respiratory:  Positive for cough.   Musculoskeletal:  Positive for arthralgias and back pain.  Neurological:  Positive for light-headedness.  All other systems reviewed and are negative.   Physical Exam Updated Vital Signs BP (!) 121/49   Pulse 85   Temp 97.9 F (36.6 C) (Oral)   Resp 17   Ht 4\' 8"  (1.422 m)   Wt 38.1 kg   LMP  (LMP Unknown)   SpO2 94%   BMI 18.83 kg/m  Physical Exam Vitals and nursing note reviewed.  Constitutional:      General: She is not in acute distress. HENT:     Nose: Nose normal.     Mouth/Throat:     Mouth: Mucous membranes are dry.     Pharynx: Oropharyngeal exudate present.  Eyes:     General: No scleral icterus.       Right eye: No discharge.        Left eye: No discharge.     Extraocular Movements: Extraocular movements intact.     Conjunctiva/sclera: Conjunctivae normal.  Cardiovascular:     Rate and Rhythm: Normal rate.     Pulses: Normal pulses.     Heart sounds: Murmur heard.  Pulmonary:     Effort: Pulmonary effort is normal.     Breath sounds: Rales (Bilateral crackles and lower lobes) present.  Abdominal:     General: Abdomen is flat.     Palpations: Abdomen is soft.     Tenderness: There is no abdominal tenderness.  Musculoskeletal:        General: Signs of injury (Right arm in sling after right humeral  fracture.  Right hip well-healing post surgery.) present.  Skin:    General: Skin is warm and dry.     Coloration: Skin is not jaundiced or pale.     Findings: No erythema.  Neurological:     Mental Status: She is alert and oriented to person, place, and time. Mental status is at baseline.  Psychiatric:     Comments: Depressed mood noted with the desire to die from this infection.  She does not want to live.  However was consulted by her daughter and mood stabilized.     ED Results / Procedures / Treatments   Labs (all labs ordered are listed, but only abnormal results are displayed) Labs Reviewed  COMPREHENSIVE METABOLIC PANEL - Abnormal; Notable for the following components:      Result Value   Glucose, Bld 126 (*)    Creatinine, Ser  0.38 (*)    Calcium 8.6 (*)    Total Protein 6.2 (*)    Albumin 3.0 (*)    Total Bilirubin 1.2 (*)    All other components within normal limits  CBC - Abnormal; Notable for the following components:   WBC 17.3 (*)    Hemoglobin 11.8 (*)    Platelets 524 (*)    All other components within normal limits  CBG MONITORING, ED - Abnormal; Notable for the following components:   Glucose-Capillary 111 (*)    All other components within normal limits  URINALYSIS, ROUTINE W REFLEX MICROSCOPIC  APTT  PROTIME-INR  BASIC METABOLIC PANEL  CBC    EKG None  Radiology DG Chest 2 View Result Date: 07/16/2023 CLINICAL DATA:  Worsening cough. EXAM: CHEST - 2 VIEW COMPARISON:  07/05/2023 FINDINGS: The heart size and mediastinal contours are within normal limits. Pulmonary hyperinflation again seen, consistent with COPD. New infiltrates are seen in both lower lobes and inferior right upper lobe, consistent with pneumonia. No pleural effusion. IMPRESSION: New bilateral lower lobe and inferior right upper lobe infiltrates, consistent with pneumonia. COPD. Electronically Signed   By: Danae Orleans M.D.   On: 07/16/2023 13:39    Procedures Procedures     Medications Ordered in ED Medications  food thickener (SIMPLYTHICK (NECTAR/LEVEL 2/MILDLY THICK)) 1 packet (has no administration in time range)  levofloxacin (LEVAQUIN) IVPB 750 mg (750 mg Intravenous New Bag/Given 07/16/23 1701)  metroNIDAZOLE (FLAGYL) IVPB 500 mg (has no administration in time range)  polyethylene glycol (MIRALAX / GLYCOLAX) packet 17 g (has no administration in time range)  sodium chloride flush (NS) 0.9 % injection 3 mL (has no administration in time range)  ALPRAZolam (XANAX) tablet 0.5 mg (0.5 mg Oral Given 07/16/23 1238)  HYDROcodone-acetaminophen (HYCET) 7.5-325 mg/15 ml solution 10 mL (10 mLs Oral Given 07/16/23 1336)  ALPRAZolam (XANAX) tablet 0.5 mg (0.5 mg Oral Given 07/16/23 1651)    ED Course/ Medical Decision Making/ A&P                                 Medical Decision Making Amount and/or Complexity of Data Reviewed Labs: ordered. Radiology: ordered.  Risk Prescription drug management.   This patient is a 89 year old female who presents to the ED for concern of agitation, hypoxia, cough, and decreased urine output.   Differential diagnoses prior to evaluation: The emergent differential diagnosis includes, but is not limited to, dehydration, pneumonia, depression, anxiety, infection, UTI, ACS, PE. This is not an exhaustive differential.   Past Medical History / Co-morbidities / Social History: Advanced Parkinson's disease, early dementia, hypothyroidism, CAD, depression, anxiety, GERD, ischemic colitis  Additional history: Chart reviewed. Pertinent results include: Previously discharged from hospital on 07/05/2023 after having screw fixation of the right hip post fall.  She also suffers from a right fracture.  Review of her swallow study also showed that she could take nectar thick fluids.  Lab Tests/Imaging studies: I personally interpreted labs/imaging and the pertinent results include:   CMP shows decreased creatinine and 0.38, decreased  protein 6.2, decreased albumin at 3.0 CBC shows elevated white count at 17.3 and elevated platelets at 524 UA pending Chest x-ray shows new bilateral lower lobe and inferior right upper lobe infiltrates consistent with pneumonia. I agree with the radiologist interpretation.    Medications: I ordered medication including Xanax, Hycet, Levaquin, Flagyl.  I have reviewed the patients home medicines and have made  adjustments as needed.  Critical Interventions:  ED Course:  Patient is a 89 year old female presented to the ED with agitation, decreased urine output.  Previous medical history of advanced Parkinson disease, early dementia, hypothyroidism, ischemic bowel, CAD, depression, anxiety, GERD.  Previously discharged from hospital on 12/12 after a fall which she suffered a right hip fracture and right humeral fracture.  Right hip fracture was treated with screw fixation.  Currently on Xarelto for VTE prophylaxis.  Swallow study was also done showing aspiration risk requiring nectar thick fluids.  She came in on 4 L of O2 via nasal cannula which was abnormal for her.   On 4 L, her saturation was 94%.  Upon evaluation patient was coughing up dark-colored phlegm, crackles in bilateral lower lobes.  X-ray confirmed bilateral lower lobe infiltrates.  Patient initially wanted to die from this infection due to not wanting to live any longer.  However after calling daughter, she talked to the patient and convinced her to be admitted and undergo treatment.  Dr. Andrey Cota that patient does have a dependence on Xanax and becomes incredibly anxious, depressed when she is not regularly treated with Xanax.  Pharmacy was consulted to address allergies and figure out what antibiotics to be placing her on.  Due to patient's previous allergies to penicillins and cephalosporins, levofloxacin and metronidazole were provided for CAP due to risk of aspiration pneumonia.  Patient expressed agreement and understanding of this  plan.  Hospitalist was then called, care was transferred over to Dr. Maryjean Ka.    Disposition: After consideration of the diagnostic results and the patients response to treatment, I feel that patient benefit from admission, continue antibiotics, continued pain control.  Care was then transferred over to Dr.Goel.   Final Clinical Impression(s) / ED Diagnoses Final diagnoses:  Pneumonia of both lower lobes due to infectious organism    Rx / DC Orders ED Discharge Orders     None         Lavonia Drafts 07/16/23 1748    Pricilla Loveless, MD  [phone]

## 2023-07-16 NOTE — Assessment & Plan Note (Signed)
S/p nailing in Dec 2024 as mentione din HPI. Patient was started on xarelto for DVT ppx after that hospitalsiaiton. C.w. same for now.

## 2023-07-16 NOTE — H&P (Addendum)
History and Physical    Patient: Tracy Malone DOB: 01-Jun-1932 DOA: 07/16/2023 DOS: the patient was seen and examined on 07/16/2023 PCP: Joselyn Arrow, MD  Patient coming from:  rehab facility  Chief Complaint:  Chief Complaint  Patient presents with   Agitation   HPI: Tracy Malone is a 89 y.o. female with medical history significant of Parkinson's disease, suspected dementia as per patient's daughter [name].  Further patient was just discharged from [facility] on July 11, 2023 after treatment for her right femoral fracture S/p percutaneous screw fixation 12/14 .  Right humerus fracture conservative managemetn.  Oropharyngeal dysphagia.  Patient was at rehab facility today and had a multitude of findings including patient complaining of not feeling well/dizzy.  Patient felt to be slightly lethargic/confused by the staff, further details of this not available.  Further per verbal report patient's diastolic blood pressure was felt to be abnormally low.  Patient was sent to the ER with finding of hypoxemia, bilateral lung crackles by the ER attending.  Diagnosed with pneumonia and started on IV antibiotics.  Patient recalls in general basically not feeling well and does not give any other complaints.  Patient is a poor historian.  There is no report of vomiting diarrhea or rigors or fever.  Medical evaluation is sought Review of Systems: unable to review all systems due to the inability of the patient to answer questions. Past Medical History:  Diagnosis Date   Acute lower UTI 11/22/2018   Bell's palsy 1966   Hx: right side facial droop, resolved per patient 04/02/19   Carotid artery disease (HCC) 2010   on vascular screening;unchanged 2013.(could not tolerate simvastatin, no other statins tried)--<30% blockage bilat 07/2011   Claustrophobia    Depression    treated in the past for years;stopped in 2010 for a years   Duodenal ulcer 1962   h/o   Dysrhythmia     ocassional PVC's, no current problems per patient on 04/02/19   Fibromyalgia    chronic fatigue, chronic abdominal pain   Frequent PVCs 07/2012   Seen by [facility]: benign, asymptomatic, normal EF   Gallstones 02/2021   on [place]   GERD (gastroesophageal reflux disease)    diet controlled   Glaucoma, narrow-angle    s/p laser surgery   History of hiatal hernia    during endoscopy   Hypothyroid 03/2007   Hypoxia 07/16/2023   IBS (irritable bowel syndrome)    Dr. Elnoria Howard   Ischemic colitis (HCC) 11/21/2018   no current problems per patient on 04/02/19   Lichenoid keratosis 12/03/2020   Dr.Stinehelfer   Ocular migraine    Osteoporosis 04/2010   Dr.Hawkes; later consulted Dr. Sharl Ma (2022)   Panic attack    Parkinson's disease (HCC) 06/23/2016   Recurrent UTI    has cystocele-Dr.Grewal   Shingles 1999   h/o   Superficial thrombophlebitis 03/2009   RLE   Trochanteric bursitis 12/2008   bilateral   Past Surgical History:  Procedure Laterality Date   ABDOMINAL HYSTERECTOMY     BACK SURGERY  05/2019   CATARACT EXTRACTION, BILATERAL  1995, 1996   EYE SURGERY Bilateral    laser - glaucoma   Flexible sigmoidoscopy     FRACTURE SURGERY  2020, Sept.  12th (!)   HIP PINNING,CANNULATED Right 07/07/2023   Procedure: PERCUTANEOUS FIXATION OF FEMORAL NECK;  Surgeon: London Sheer, MD;  Location: [facility] OR;  Service: Orthopedics;  Laterality: Right;   KYPHOPLASTY N/A 04/03/2019  Procedure: KYPHOPLASTY L1;  Surgeon: Venita Lick, MD;  Location: [facility] OR;  Service: Orthopedics;  Laterality: N/A;  90 mins   RADIOFREQUENCY ABLATION Bilateral 11/14/2022   L3-4, L4-5 bilateral ([facility])   THYROIDECTOMY, PARTIAL  09/2005   L nodule; Dr. Gerrit Friends   TONSILLECTOMY  1946   UPPER GI ENDOSCOPY  06/27/2012   VAGINAL HYSTERECTOMY  1971   and bladder repair.  Still has ovaries   WISDOM TOOTH EXTRACTION     Social History:  reports that she has never smoked. She has never used smokeless tobacco.  She reports that she does not drink alcohol and does not use drugs.  Allergies  Allergen Reactions   Iodinated Contrast Media Anaphylaxis   Iodine Anaphylaxis    IV and topical forms. Unknown   Levsin [Hyoscyamine Sulfate]     Vision problems/pt has glaucoma   Salmon [Fish Allergy] Hives and Shortness Of Breath   Shellfish Allergy Anaphylaxis   Tramadol Swelling   Remeron [Mirtazapine] Other (See Comments)    Cause blurred vision and red eyes, pt has glaucoma   Aspirin Other (See Comments)    Sever stomach pain due to ulcer scaring.   Bis Subcit-Metronid-Tetracyc Swelling    Tongue swelling. Face tingling    Cephalexin Hives   Ciprofloxacin Diarrhea   Codeine Nausea And Vomiting   Cyclobenzaprine Other (See Comments)    Tingly/prickly sensation.    Darvocet [Propoxyphene N-Acetaminophen] Nausea And Vomiting   Demerol [Meperidine] Nausea Only   Dexlansoprazole Swelling    Redness, swelling and peeling of both feet, foot pain   Diphedryl [Diphenhydramine] Other (See Comments)    Increased pulse/small amount ok   Doxycycline Hyclate Other (See Comments)    GI intolerance.   Doxycycline Hyclate      GI intolerance   Epinephrine Other (See Comments)    Breathing problems/fainting   Erythromycin Other (See Comments)    GI intolerance.    Fish Oil     breathing problems/hives   Hyoscyamine     eye pain   Latex Other (See Comments)    Gloves ok.  Skin gets red from elastic in underwear and latex bandaides.   Nitrofurantoin Diarrhea   Nsaids Other (See Comments)   Other     migraines: increased pulse, faint, diarrhea   Prednisone Other (See Comments)    Headache Other reaction(s): headache   Ra Diphedryl Allergy [Diphenhydramine Hcl]     Other reaction(s): increased pulse small dose okay   Sertraline Hcl Swelling and Other (See Comments)    Migraine Swelling of tongue/lip (09/2012) Other reaction(s): Unknown   Shellfish-Derived Products     Other reaction(s):  Unknown   Sulfa Antibiotics Other (See Comments)    Increased pulse, fainting, diarrhea, thrush   Wellbutrin [Bupropion] Other (See Comments)    Headaches   Xylocaine [Lidocaine Hcl]     With epinephrine, given by dentist.  Speeded up heart rate and she passed out (occured twice, at dentist)   Xylocaine [Lidocaine]     Unknown   Biaxin [Clarithromycin] Rash    Started after completing 10 day course of 2000 mg /day, Lips swelling   Ibuprofen Other (See Comments)    Motrin ok with a GI effect. rash Motrin okay with a GI effect   Meperidine Hcl Nausea And Vomiting   Penicillins Hives and Rash    Family History  Problem Relation Age of Onset   Heart disease Mother    Hypertension Mother    Hypertension Sister  Arthritis Sister    Heart disease Brother    Lung cancer Brother        lung   Diabetes Maternal Grandfather    HIV Son    Diabetes Granddaughter        type 1   Colon cancer Neg Hx    Esophageal cancer Neg Hx    Prostate cancer Neg Hx    Stomach cancer Neg Hx    Rectal cancer Neg Hx     Prior to Admission medications   Medication Sig Start Date End Date Taking? Authorizing Provider  acetaminophen (TYLENOL) 325 MG tablet Take 2 tablets (650 mg total) by mouth every 6 (six) hours as needed for mild pain (pain score 1-3). 07/11/23   Ghimire, Werner Lean, MD  ALPRAZolam Prudy Feeler) 0.5 MG tablet TAKE FOUR TABLETS BY MOUTH DAILY AS NEEDED FOR ANXIETY 07/11/23   Ghimire, Werner Lean, MD  b complex vitamins capsule Take 1 capsule by mouth daily.    [provider]  Carbidopa-Levodopa ER (SINEMET CR) 25-100 MG tablet controlled release TAKE ONE TABLET BY MOUTH THREE TIMES A DAY 06/28/23   Sater, Pearletha Furl, MD  Cholecalciferol (VITAMIN D-3 PO) Take 1 capsule by mouth 3 (three) times a week.    [provider]  dicyclomine (BENTYL) 10 MG capsule Take 1 capsule (10 mg total) by mouth every 6 (six) hours as needed for spasms. Patient taking differently: Take 10 mg by  mouth at bedtime as needed for spasms. 05/29/23   Cirigliano, Vito V, DO  feeding supplement (ENSURE ENLIVE / ENSURE PLUS) LIQD Take 237 mLs by mouth 2 (two) times daily between meals. 07/11/23   Ghimire, Werner Lean, MD  FLUoxetine (PROZAC) 10 MG capsule Take 1 capsule (10 mg total) by mouth daily. 05/10/23   Mozingo, Thereasa Solo, NP  methocarbamol (ROBAXIN) 500 MG tablet Take 1 tablet (500 mg total) by mouth every 6 (six) hours as needed for muscle spasms. 07/11/23   Ghimire, Werner Lean, MD  oxyCODONE (OXY IR/ROXICODONE) 5 MG immediate release tablet Take 1 tablet (5 mg total) by mouth every 6 (six) hours as needed for moderate pain (pain score 4-6). 07/11/23   Ghimire, Werner Lean, MD  polyethylene glycol (MIRALAX) 17 g packet Take 17 g by mouth daily as needed. Patient taking differently: Take 17 g by mouth daily. 04/20/22   Gareth Eagle, PA-C  Probiotic Product (PROBIOTIC PO) Take 1 capsule by mouth 2 (two) times a week.    [provider]  rivaroxaban (XARELTO) 10 MG TABS tablet Take 1 tablet (10 mg total) by mouth daily for 21 days. 07/11/23 08/01/23  Ghimire, Werner Lean, MD  SYNTHROID 25 MCG tablet TAKE 1 TABLET DAILY MONDAY THROUGH [place], SKIP SUNDAY FOR HYPOTHYROIDISM 03/15/23   Joselyn Arrow, MD    Physical Exam: Vitals:   07/16/23 1515 07/16/23 1530 07/16/23 1800 07/16/23 1855  BP: (!) 120/57 (!) 121/49 (!) 96/51 (!) 117/48  Pulse: 83 85 83 90  Resp: 17  16 (!) 24  Temp:      TempSrc:      SpO2: 94% 94% 98% 98%  Weight:      Height:       General: Thin appearing frail lady right arm in sling.  Not distressed.  On 4 L/min of supplementary oxygen.  Alert and awake, gives history on her own, although it is very poor.  Patient did not tell me that she was at rehab, she same to suggest that she was  at home. Respiratory exam: Good excursion bilaterally left basilar posterior crackles Cardiovascular exam S1-S2 normal Abdomen soft nontender Extremities warm without edema limited  right hip range of motion, I did not insist on moving this due to patient discomfort.  Right arm in sling, distal function intact in all extremities Data Reviewed:  Labs on Admission:  Results for orders placed or performed during the hospital encounter of 07/16/23 (from the past 24 hours)  Comprehensive metabolic panel     Status: Abnormal   Collection Time: 07/16/23 11:56 AM  Result Value Ref Range   Sodium 144 135 - 145 mmol/L   Potassium 3.6 3.5 - 5.1 mmol/L   Chloride 107 98 - 111 mmol/L   CO2 28 22 - 32 mmol/L   Glucose, Bld 126 (H) 70 - 99 mg/dL   BUN 17 8 - 23 mg/dL   Creatinine, Ser 1.61 (L) 0.44 - 1.00 mg/dL   Calcium 8.6 (L) 8.9 - 10.3 mg/dL   Total Protein 6.2 (L) 6.5 - 8.1 g/dL   Albumin 3.0 (L) 3.5 - 5.0 g/dL   AST 21 15 - 41 U/L   ALT 7 0 - 44 U/L   Alkaline Phosphatase 67 38 - 126 U/L   Total Bilirubin 1.2 (H) <1.2 mg/dL   GFR, Estimated >09 >60 mL/min   Anion gap 9 5 - 15  CBC     Status: Abnormal   Collection Time: 07/16/23 11:56 AM  Result Value Ref Range   WBC 17.3 (H) 4.0 - 10.5 K/uL   RBC 3.91 3.87 - 5.11 MIL/uL   Hemoglobin 11.8 (L) 12.0 - 15.0 g/dL   HCT 45.4 09.8 - 11.9 %   MCV 95.9 80.0 - 100.0 fL   MCH 30.2 26.0 - 34.0 pg   MCHC 31.5 30.0 - 36.0 g/dL   RDW 14.7 82.9 - 56.2 %   Platelets 524 (H) 150 - 400 K/uL   nRBC 0.0 0.0 - 0.2 %  CBG monitoring, ED     Status: Abnormal   Collection Time: 07/16/23 12:15 PM  Result Value Ref Range   Glucose-Capillary 111 (H) 70 - 99 mg/dL   *Note: Due to a large number of results and/or encounters for the requested time period, some results have not been displayed. A complete set of results can be found in Results Review.   Basic Metabolic Panel: Recent Labs  Lab 07/16/23 1156  NA 144  K 3.6  CL 107  CO2 28  GLUCOSE 126*  BUN 17  CREATININE 0.38*  CALCIUM 8.6*   Liver Function Tests: Recent Labs  Lab 07/16/23 1156  AST 21  ALT 7  ALKPHOS 67  BILITOT 1.2*  PROT 6.2*  ALBUMIN 3.0*   No  results for input(s): "LIPASE", "AMYLASE" in the last 168 hours. No results for input(s): "AMMONIA" in the last 168 hours. CBC: Recent Labs  Lab 07/16/23 1156  WBC 17.3*  HGB 11.8*  HCT 37.5  MCV 95.9  PLT 524*   Cardiac Enzymes: No results for input(s): "CKTOTAL", "CKMB", "CKMBINDEX", "TROPONINIHS" in the last 168 hours.  BNP (last 3 results) No results for input(s): "PROBNP" in the last 8760 hours. CBG: Recent Labs  Lab 07/16/23 1215  GLUCAP 111*    Radiological Exams on Admission:  DG Chest 2 View Result Date: 07/16/2023 CLINICAL DATA:  Worsening cough. EXAM: CHEST - 2 VIEW COMPARISON:  07/05/2023 FINDINGS: The heart size and mediastinal contours are within normal limits. Pulmonary hyperinflation again seen, consistent with COPD. New  infiltrates are seen in both lower lobes and inferior right upper lobe, consistent with pneumonia. No pleural effusion. IMPRESSION: New bilateral lower lobe and inferior right upper lobe infiltrates, consistent with pneumonia. COPD. Electronically Signed   By: Danae Orleans M.D.   On: 07/16/2023 13:39     Assessment and Plan: * Hypoxia Patient presenting today with finding of soft blood pressure at rehab facility with additional finding of patient being slightly dizzy/not feeling well.  Found to be hypoxic on presentation with chest x-ray finding of bilateral pneumonia with worsening leukocytosis.  I will draw blood cultures.  Patient has received levofloxacin and metronidazole in the ER with concern for aspiration pneumonia.  I will continue with levofloxacin and metronidazole given that patient has allergies listed to both penicillin as well as cephalosporins.  I will add vancomycin given recent hospitalization till blood cultures are negative.  I will monitor patient's oxygen level.  Continue with 4 L/min of supplementary oxygen.  Closed fracture of neck of right femur (HCC) S/p nailing in Dec 2024 as mentione din HPI. Patient was started on  xarelto for DVT ppx after that hospitalsiaiton. C.w. same for now.  Functional dyspepsia This is chronically documented. Patient reports chronic burning sensation epigastric versus left upper quadrant area.  I will treat with tums.  Further workup as outpatient  Resting tremor Patient has diagnosis of Parkinson's disease, daughter reports that she has been advised of patient having dementia as well.  I will continue with Sinemet.  Fall precautions  Hypothyroidism [place] with Synthroid  Osteopenia Recent fracture of the right femoral neck as well as humerus after a fall.  Patient was discharged after treatment on July 11, 2023.  Continue with sling for the right forearm  Anxiety state Continue with act anxiety related medication Xanax as needed. Patinet apprently on stadnign xanax at home. Concern that this may be worsening aspiration. However, family reports she gets very agitated wihtout it. Will monitor.   Patient was previously on hospice care.  However after the femoral neck fracture in mid December, decision was made to get her out of hospice care and get her rehab.  Please review home medications after pharmacy input is complete.   Advance Care Planning:   Code Status: Limited: Do not attempt resuscitation (DNR) -DNR-LIMITED -Do Not Intubate/DNI discussed with patient and daughter  Consults: None at this time  Family Communication: Discussed with daughter [name] over the phone in detail  Severity of Illness: The appropriate patient status for this patient is INPATIENT. Inpatient status is judged to be reasonable and necessary in order to provide the required intensity of service to ensure the patient's safety. The patient's presenting symptoms, physical exam findings, and initial radiographic and laboratory data in the context of their chronic comorbidities is felt to place them at high risk for further clinical deterioration. Furthermore, it is not anticipated that the patient  will be medically stable for discharge from the hospital within 2 midnights of admission.   * I certify that at the point of admission it is my clinical judgment that the patient will require inpatient hospital care spanning beyond 2 midnights from the point of admission due to high intensity of service, high risk for further deterioration and high frequency of surveillance required.*  Author: Nolberto Hanlon, MD 07/16/2023 7:03 PM  For on call review [email or web address].

## 2023-07-16 NOTE — ED Notes (Signed)
Pt refused blood cultures, Dr. Maryjean Ka has been notified.

## 2023-07-16 NOTE — Assessment & Plan Note (Addendum)
This is chronically documented. Patient reports chronic burning sensation epigastric versus left upper quadrant area.  I will treat with tums.  Further workup as outpatient

## 2023-07-16 NOTE — ED Notes (Signed)
Pt was given a moistened mouth sponge and pt started choking on the minimal amount of water from it. Pt suctioned out and no further distress was noted.

## 2023-07-16 NOTE — ED Triage Notes (Signed)
Pt BIB [facility] from [facility]. Pt presents today for increased agitation and decreased urine output. Pt is A/Ox4 at baseline. Notes from facility stating that when pt doesn't take anxiety meds this is how pt presents. EMS report facility noted a B/P of 114/38.   EMS Vitals  112/60 HR 82 CBG 168 Temp 97.3 RR 20

## 2023-07-16 NOTE — Assessment & Plan Note (Signed)
Patient presenting today with finding of soft blood pressure at rehab facility with additional finding of patient being slightly dizzy/not feeling well.  Found to be hypoxic on presentation with chest x-ray finding of bilateral pneumonia with worsening leukocytosis.  I will draw blood cultures.  Patient has received levofloxacin and metronidazole in the ER with concern for aspiration pneumonia.  I will continue with levofloxacin and metronidazole given that patient has allergies listed to both penicillin as well as cephalosporins.  I will add vancomycin given recent hospitalization till blood cultures are negative.  I will monitor patient's oxygen level.  Continue with 4 L/min of supplementary oxygen.

## 2023-07-16 NOTE — Assessment & Plan Note (Addendum)
Continue with act anxiety related medication Xanax as needed. Patinet apprently on stadnign xanax at home. Concern that this may be worsening aspiration. However, family reports she gets very agitated wihtout it. Will monitor.

## 2023-07-16 NOTE — Assessment & Plan Note (Signed)
Patient has diagnosis of Parkinson's disease, daughter reports that she has been advised of patient having dementia as well.  I will continue with Sinemet.  Fall precautions

## 2023-07-16 NOTE — Telephone Encounter (Signed)
Pt's daughter reports pt has recently had a brain CT thru [facility].  She would like to know if Dr Epimenio Foot can get access to those results and call her to discuss.

## 2023-07-16 NOTE — Assessment & Plan Note (Signed)
Tinney with Synthroid

## 2023-07-16 NOTE — Progress Notes (Signed)
Patinet had hypotesnion to 96/46 with MAP of 60 about 40 minutes ago. Good response to Fludis. Examined in person. Exam otherwise as before. Will check tropnin and r.o VTE by doing a lower extremity doppler. Upgraded to progressive care.

## 2023-07-16 NOTE — Progress Notes (Signed)
Pharmacy Antibiotic Note  Tracy Malone is a 89 y.o. female admitted on 07/16/2023 with pneumonia.  Pharmacy has been consulted for vancomycin dosing.  Plan: Vancomycin 750 mg IV q48 hr (est AUC 508 based on SCr 0.8; Vd 0.72) Measure vancomycin AUC at steady state as indicated SCr q48 while on vanc MRSA PCR ordered; f/u and narrow vanc as appropriate Levaquin per MD; will adjust to 750 mg IV q48 hr based on CrCl 20-50 ml/min  Height: 4\' 8"  (142.2 cm) Weight: 38.1 kg (84 lb) IBW/kg (Calculated) : 36.3  Temp (24hrs), Avg:98.1 F (36.7 C), Min:97.9 F (36.6 C), Max:98.3 F (36.8 C)  Recent Labs  Lab 07/16/23 1156  WBC 17.3*  CREATININE 0.38*    Estimated Creatinine Clearance: 27.3 mL/min (A) (by C-G formula based on SCr of 0.38 mg/dL (L)).    Allergies  Allergen Reactions   Iodinated Contrast Media Anaphylaxis   Iodine Anaphylaxis    IV and topical forms. Unknown   Levsin [Hyoscyamine Sulfate]     Vision problems/pt has glaucoma   Salmon [Fish Allergy] Hives and Shortness Of Breath   Shellfish Allergy Anaphylaxis   Tramadol Swelling   Remeron [Mirtazapine] Other (See Comments)    Cause blurred vision and red eyes, pt has glaucoma   Aspirin Other (See Comments)    Sever stomach pain due to ulcer scaring.   Bis Subcit-Metronid-Tetracyc Swelling    Tongue swelling. Face tingling    Cephalexin Hives   Ciprofloxacin Diarrhea   Codeine Nausea And Vomiting   Cyclobenzaprine Other (See Comments)    Tingly/prickly sensation.    Darvocet [Propoxyphene N-Acetaminophen] Nausea And Vomiting   Demerol [Meperidine] Nausea Only   Dexlansoprazole Swelling    Redness, swelling and peeling of both feet, foot pain   Diphedryl [Diphenhydramine] Other (See Comments)    Increased pulse/small amount ok   Doxycycline Hyclate Other (See Comments)    GI intolerance.   Doxycycline Hyclate      GI intolerance   Epinephrine Other (See Comments)    Breathing problems/fainting    Erythromycin Other (See Comments)    GI intolerance.    Fish Oil     breathing problems/hives   Hydrocodone Swelling   Hyoscyamine     eye pain   Latex Other (See Comments)    Gloves ok.  Skin gets red from elastic in underwear and latex bandaides.   Nitrofurantoin Diarrhea   Nsaids Other (See Comments)   Other     migraines: increased pulse, faint, diarrhea   Prednisone Other (See Comments)    Headache Other reaction(s): headache   Ra Diphedryl Allergy [Diphenhydramine Hcl]     Other reaction(s): increased pulse small dose okay   Sertraline Hcl Swelling and Other (See Comments)    Migraine Swelling of tongue/lip (09/2012) Other reaction(s): Unknown   Shellfish-Derived Products     Other reaction(s): Unknown   Sulfa Antibiotics Other (See Comments)    Increased pulse, fainting, diarrhea, thrush   Wellbutrin [Bupropion] Other (See Comments)    Headaches   Xylocaine [Lidocaine Hcl]     With epinephrine, given by dentist.  Speeded up heart rate and she passed out (occured twice, at dentist)   Xylocaine [Lidocaine]     Unknown   Biaxin [Clarithromycin] Rash    Started after completing 10 day course of 2000 mg /day, Lips swelling   Fluoxetine Other (See Comments)    Dry mouth   Ibuprofen Other (See Comments)    Motrin ok with a GI  effect. rash Motrin okay with a GI effect   Meperidine Hcl Nausea And Vomiting   Penicillins Hives and Rash    Antimicrobials this admission: 12/23 levofloxacin >>  12/23 vancomycin >>   Dose adjustments this admission: N/a  Microbiology results: 12/23 BCx: sent 12/23 MRSA PCR: sent  Thank you for allowing pharmacy to be a part of this patient's care.  Adelae Yodice A 07/16/2023 7:39 PM

## 2023-07-16 NOTE — ED Notes (Signed)
Pt's given applesauce to swallow meds.

## 2023-07-16 NOTE — Assessment & Plan Note (Signed)
Recent fracture of the right femoral neck as well as humerus after a fall.  Patient was discharged after treatment on July 11, 2023.  Continue with sling for the right forearm

## 2023-07-17 ENCOUNTER — Inpatient Hospital Stay (HOSPITAL_COMMUNITY): Payer: Medicare Other

## 2023-07-17 DIAGNOSIS — M7989 Other specified soft tissue disorders: Secondary | ICD-10-CM | POA: Diagnosis not present

## 2023-07-17 DIAGNOSIS — R0902 Hypoxemia: Secondary | ICD-10-CM | POA: Diagnosis not present

## 2023-07-17 LAB — RESPIRATORY PANEL BY PCR

## 2023-07-17 LAB — URINALYSIS, ROUTINE W REFLEX MICROSCOPIC
Bilirubin Urine: NEGATIVE
Glucose, UA: NEGATIVE mg/dL
Ketones, ur: NEGATIVE mg/dL
Nitrite: POSITIVE — AB
Protein, ur: NEGATIVE mg/dL
Specific Gravity, Urine: 1.026 (ref 1.005–1.030)
WBC, UA: >50 WBC/hpf (ref 0–5)
pH: 5 (ref 5.0–8.0)

## 2023-07-17 LAB — APTT: aPTT: 22 s — ABNORMAL LOW (ref 24–36)

## 2023-07-17 LAB — CBC
HCT: 30.2 % — ABNORMAL LOW (ref 36.0–46.0)
Hemoglobin: 9.3 g/dL — ABNORMAL LOW (ref 12.0–15.0)
MCH: 30.6 pg (ref 26.0–34.0)
MCHC: 30.8 g/dL (ref 30.0–36.0)
MCV: 99.3 fL (ref 80.0–100.0)
Platelets: 419 10*3/uL — ABNORMAL HIGH (ref 150–400)
RBC: 3.04 MIL/uL — ABNORMAL LOW (ref 3.87–5.11)
RDW: 13.8 % (ref 11.5–15.5)
WBC: 22.8 10*3/uL — ABNORMAL HIGH (ref 4.0–10.5)
nRBC: 0 % (ref 0.0–0.2)

## 2023-07-17 LAB — BASIC METABOLIC PANEL
Anion gap: 9 (ref 5–15)
BUN: 22 mg/dL (ref 8–23)
CO2: 24 mmol/L (ref 22–32)
Calcium: 8.1 mg/dL — ABNORMAL LOW (ref 8.9–10.3)
Chloride: 110 mmol/L (ref 98–111)
Creatinine, Ser: 0.48 mg/dL (ref 0.44–1.00)
GFR, Estimated: >60 mL/min (ref >60)
Glucose, Bld: 117 mg/dL — ABNORMAL HIGH (ref 70–99)
Potassium: 4.1 mmol/L (ref 3.5–5.1)
Sodium: 143 mmol/L (ref 135–145)

## 2023-07-17 LAB — TROPONIN I (HIGH SENSITIVITY): Troponin I (High Sensitivity): 8 ng/L (ref <18)

## 2023-07-17 LAB — PROTIME-INR
INR: 1.2 (ref 0.8–1.2)
Prothrombin Time: 15.3 s — ABNORMAL HIGH (ref 11.4–15.2)

## 2023-07-17 LAB — SARS CORONAVIRUS 2 BY RT PCR: SARS Coronavirus 2 by RT PCR: NEGATIVE

## 2023-07-17 LAB — MRSA NEXT GEN BY PCR, NASAL: MRSA by PCR Next Gen: NOT DETECTED

## 2023-07-17 MED ORDER — SODIUM CHLORIDE 0.9 % IV SOLN
3.0000 g | Freq: Two times a day (BID) | INTRAVENOUS | Status: DC
  Administered 2023-07-17 – 2023-07-19 (×4): 3 g via INTRAVENOUS
  Filled 2023-07-17 (×5): qty 8

## 2023-07-17 MED ORDER — ADULT MULTIVITAMIN W/MINERALS CH
1.0000 | ORAL_TABLET | Freq: Every day | ORAL | Status: DC
  Administered 2023-07-17 – 2023-07-19 (×3): 1 via ORAL
  Filled 2023-07-17 (×3): qty 1

## 2023-07-17 MED ORDER — ENSURE ENLIVE PO LIQD
237.0000 mL | Freq: Two times a day (BID) | ORAL | Status: DC
  Administered 2023-07-17 – 2023-07-18 (×3): 237 mL via ORAL

## 2023-07-17 NOTE — Progress Notes (Signed)
Bilateral lower extremity venous duplex has been completed. Preliminary results can be found in CV Proc through chart review.   07/17/23 8:43 AM Olen Cordial RVT

## 2023-07-17 NOTE — Progress Notes (Signed)
PROGRESS NOTE    Tracy Malone  KGM:[id number] DOB: 1931-09-24 DOA: 07/16/2023 PCP: Joselyn Arrow, MD   Brief Narrative:   89 y.o. female with medical history significant of Parkinson's disease, suspected dementia, on hospice for failure to thrive prior to recent hospitalization from 07/05/2023 - 07/11/2023 for right hip fracture and right humerus fracture requiring percutaneous screw fixation of right hip fracture on 07/07/2023 with subsequent discharge to SNF.  She presented with increasing lethargy/confusion/agitation and low blood pressure.  On presentation, she was found to be hypoxic and chest x-ray showed possible pneumonia.  She was started on IV antibiotics.  Assessment & Plan:   Possible bilateral pneumonia with concern for aspiration pneumonia Acute respiratory failure with hypoxia -Still on 3 L oxygen by nasal cannula.  Respiratory panel PCR negative.  Switch antibiotics to IV Unasyn.  Doubt that patient has MRSA pneumonia.  DC vancomycin.  Wean off oxygen as able. -SLP evaluation.  Leukocytosis -Worsening.  Monitor.  Anemia of chronic disease -From chronic illnesses.  Hemoglobin stable.  Monitor intermittently  Thrombocytosis -Possibly reactive.  Monitor intermittently  Recent closed fracture of of neck of right femur -Status post nailing on 07/07/2023 and subsequent discharge to SNF on oral Xarelto.  Continue with the same.  PT eval.  Parkinson disease Possible Parkinson's dementia Tremors -Continue carbidopa levodopa.  Outpatient follow-up with neurology.  Fall precautions.  Hypothyroidism Continue levothyroxine  Osteopenia Recent right humerus fracture -Humerus fracture was treated conservatively.  Continue with sling for the right upper extremity.  Outpatient follow-up with orthopedics.  Continue calcium carbonate  Anxiety -Continue Xanax as needed.  Goals of care -Consult palliative care for goals of care discussion.  Patient apparently was on hospice  prior to her recent surgical intervention for failure to thrive.   DVT prophylaxis: Xarelto Code Status: DNR Family Communication: None at bedside Disposition Plan: Status is: Inpatient Remains inpatient appropriate because: Of severity of illness    Consultants: Consult palliative care  Procedures: None  Antimicrobials:  Anti-infectives (From admission, onward)    Start     Dose/Rate Route Frequency Ordered Stop   07/18/23 1400  levofloxacin (LEVAQUIN) IVPB 750 mg  Status:  Discontinued        750 mg 100 mL/hr over 90 Minutes Intravenous Every 48 hours 07/16/23 1901 07/17/23 0940   07/17/23 2200  vancomycin (VANCOREADY) IVPB 750 mg/150 mL  Status:  Discontinued        750 mg 150 mL/hr over 60 Minutes Intravenous Every 48 hours 07/16/23 1933 07/17/23 0940   07/17/23 1400  Ampicillin-Sulbactam (UNASYN) 3 g in sodium chloride 0.9 % 100 mL IVPB        3 g 200 mL/hr over 30 Minutes Intravenous Every 12 hours 07/17/23 0940     07/16/23 2000  vancomycin (VANCOREADY) IVPB 750 mg/150 mL        750 mg 150 mL/hr over 60 Minutes Intravenous  Once 07/16/23 1933 07/16/23 2131   07/16/23 1645  levofloxacin (LEVAQUIN) IVPB 750 mg        750 mg 100 mL/hr over 90 Minutes Intravenous  Once 07/16/23 1630 07/16/23 1844   07/16/23 1645  metroNIDAZOLE (FLAGYL) IVPB 500 mg  Status:  Discontinued        500 mg 100 mL/hr over 60 Minutes Intravenous Every 12 hours 07/16/23 1630 07/17/23 0940        Subjective: Patient seen and examined at bedside.  Wakes up slightly, extremely slow to respond and confused.  No fever, seizures, agitation  reported.  Objective: Vitals:   07/16/23 2100 07/16/23 2246 07/17/23 0157 07/17/23 0600  BP: (!) 96/46 113/61 (!) 104/39 (!) 107/52  Pulse: 79 86 70 68  Resp: 20  12   Temp:  98.7 F (37.1 C) 97.9 F (36.6 C) 97.9 F (36.6 C)  TempSrc:  Oral Oral Oral  SpO2: 99% 95% 100% 97%  Weight:      Height:        Intake/Output Summary (Last 24 hours) at  07/17/2023 1045 Last data filed at 07/17/2023 0600 Gross per 24 hour  Intake 937.72 ml  Output 300 ml  Net 637.72 ml   Filed Weights   07/16/23 1131  Weight: 38.1 kg    Examination:  General exam: Appears calm and comfortable.  On 3 L oxygen via nasal cannula.  Looks chronically ill and deconditioned.  Elderly female lying in bed. Respiratory system: Bilateral decreased breath sounds at bases with scattered crackles Cardiovascular system: S1 & S2 heard, Rate controlled Gastrointestinal system: Abdomen is nondistended, soft and nontender. Normal bowel sounds heard. Extremities: No cyanosis, clubbing, edema.  Right arm in sling. Central nervous system: Wakes up slightly, confused.  Extremely slow to respond.  No focal neurological deficits. Moving extremities Skin: No rashes, lesions or ulcers Psychiatry: Flat affect.  Not agitated.   Data Reviewed: I have personally reviewed following labs and imaging studies  CBC: Recent Labs  Lab 07/16/23 1156 07/17/23 0655  WBC 17.3* 22.8*  HGB 11.8* 9.3*  HCT 37.5 30.2*  MCV 95.9 99.3  PLT 524* 419*   Basic Metabolic Panel: Recent Labs  Lab 07/16/23 1156 07/17/23 0027  NA 144 143  K 3.6 4.1  CL 107 110  CO2 28 24  GLUCOSE 126* 117*  BUN 17 22  CREATININE 0.38* 0.48  CALCIUM 8.6* 8.1*   GFR: Estimated Creatinine Clearance: 27.3 mL/min (by C-G formula based on SCr of 0.48 mg/dL). Liver Function Tests: Recent Labs  Lab 07/16/23 1156  AST 21  ALT 7  ALKPHOS 67  BILITOT 1.2*  PROT 6.2*  ALBUMIN 3.0*   No results for input(s): "LIPASE", "AMYLASE" in the last 168 hours. No results for input(s): "AMMONIA" in the last 168 hours. Coagulation Profile: Recent Labs  Lab 07/17/23 0028  INR 1.2   Cardiac Enzymes: No results for input(s): "CKTOTAL", "CKMB", "CKMBINDEX", "TROPONINI" in the last 168 hours. BNP (last 3 results) No results for input(s): "PROBNP" in the last 8760 hours. HbA1C: No results for input(s):  "HGBA1C" in the last 72 hours. CBG: Recent Labs  Lab 07/16/23 1215  GLUCAP 111*   Lipid Profile: No results for input(s): "CHOL", "HDL", "LDLCALC", "TRIG", "CHOLHDL", "LDLDIRECT" in the last 72 hours. Thyroid Function Tests: No results for input(s): "TSH", "T4TOTAL", "FREET4", "T3FREE", "THYROIDAB" in the last 72 hours. Anemia Panel: No results for input(s): "VITAMINB12", "FOLATE", "FERRITIN", "TIBC", "IRON", "RETICCTPCT" in the last 72 hours. Sepsis Labs: No results for input(s): "PROCALCITON", "LATICACIDVEN" in the last 168 hours.  Recent Results (from the past 240 hours)  Respiratory (~20 pathogens) panel by PCR     Status: None   Collection Time: 07/16/23  9:59 PM   Specimen: Nasopharyngeal Swab; Respiratory  Result Value Ref Range Status   Adenovirus NOT DETECTED NOT DETECTED Final   Coronavirus 229E NOT DETECTED NOT DETECTED Final    Comment: (NOTE) The Coronavirus on the Respiratory Panel, DOES NOT test for the novel  Coronavirus (2019 nCoV)    Coronavirus HKU1 NOT DETECTED NOT DETECTED Final   Coronavirus  NL63 NOT DETECTED NOT DETECTED Final   Coronavirus OC43 NOT DETECTED NOT DETECTED Final   Metapneumovirus NOT DETECTED NOT DETECTED Final   Rhinovirus / Enterovirus NOT DETECTED NOT DETECTED Final   Influenza A NOT DETECTED NOT DETECTED Final   Influenza B NOT DETECTED NOT DETECTED Final   Parainfluenza Virus 1 NOT DETECTED NOT DETECTED Final   Parainfluenza Virus 2 NOT DETECTED NOT DETECTED Final   Parainfluenza Virus 3 NOT DETECTED NOT DETECTED Final   Parainfluenza Virus 4 NOT DETECTED NOT DETECTED Final   Respiratory Syncytial Virus NOT DETECTED NOT DETECTED Final   Bordetella pertussis NOT DETECTED NOT DETECTED Final   Bordetella Parapertussis NOT DETECTED NOT DETECTED Final   Chlamydophila pneumoniae NOT DETECTED NOT DETECTED Final   Mycoplasma pneumoniae NOT DETECTED NOT DETECTED Final    Comment: Performed at [facility], [address],  [place], [place] [address]  Culture, blood (Routine X 2) w Reflex to ID Panel     Status: None (Preliminary result)   Collection Time: 07/16/23 10:12 PM   Specimen: Left Antecubital; Blood  Result Value Ref Range Status   Specimen Description   Final    LEFT ANTECUBITAL Performed at [facility], [address] [place], [place] [address]    Special Requests   Final    BOTTLES DRAWN AEROBIC AND ANAEROBIC Blood Culture results may not be optimal due to an inadequate volume of blood received in culture bottles Performed at [facility], [address] [place], [place] [address]    Culture   Final    NO GROWTH < 12 HOURS Performed at [facility], [address] [place], [place] [address]    Report Status PENDING  Incomplete  MRSA Next Gen by PCR, Nasal     Status: None   Collection Time: 07/17/23 12:35 AM   Specimen: Nasal Mucosa; Nasal Swab  Result Value Ref Range Status   MRSA by PCR Next Gen NOT DETECTED NOT DETECTED Final    Comment: (NOTE) The GeneXpert MRSA Assay (FDA approved for NASAL specimens only), is one component of a comprehensive MRSA colonization surveillance program. It is not intended to diagnose MRSA infection nor to guide or monitor treatment for MRSA infections. Test performance is not FDA approved in patients less than 33 years old. Performed at [facility], [address] [place], [place] [address]          Radiology Studies: VAS [place] LOWER EXTREMITY VENOUS (DVT) Result Date: 07/17/2023  Lower Venous DVT Study Patient Name:  SHELVEY STAEBELL  Date of Exam:   07/17/2023 Medical Rec #: [id number]        Accession #:    [id number] Date of Birth: [birth year]         Patient Gender: F Patient Age:   6 years Exam Location:  [facility] Procedure:      VAS [place] LOWER EXTREMITY VENOUS (DVT) Referring Phys: [facility] GOEL --------------------------------------------------------------------------------   Indications: Swelling.  Risk Factors: Trauma. Limitations: Poor ultrasound/tissue interface and patient positioning, patient immobility, patient pain tolerance. Comparison Study: No prior studies. Performing Technologist: Chanda Busing RVT  Examination Guidelines: A complete evaluation includes B-mode imaging, spectral Doppler, color Doppler, and power Doppler as needed of all accessible portions of each vessel. Bilateral testing is considered an integral part of a complete examination. Limited examinations for reoccurring indications may be performed as noted. The reflux portion of the exam is performed with the patient  in reverse Trendelenburg.  +---------+---------------+---------+-----------+----------+--------------+ RIGHT    CompressibilityPhasicitySpontaneityPropertiesThrombus Aging +---------+---------------+---------+-----------+----------+--------------+ CFV      Full           Yes      Yes                                 +---------+---------------+---------+-----------+----------+--------------+ SFJ      Full                                                        +---------+---------------+---------+-----------+----------+--------------+ FV Prox  Full                                                        +---------+---------------+---------+-----------+----------+--------------+ FV Mid   Full                                                        +---------+---------------+---------+-----------+----------+--------------+ FV DistalFull                                                        +---------+---------------+---------+-----------+----------+--------------+ PFV      Full                                                        +---------+---------------+---------+-----------+----------+--------------+ POP      Full           Yes      Yes                                 +---------+---------------+---------+-----------+----------+--------------+  PTV      Full                                                        +---------+---------------+---------+-----------+----------+--------------+ PERO     Full                                                        +---------+---------------+---------+-----------+----------+--------------+   +---------+---------------+---------+-----------+----------+--------------+ LEFT     CompressibilityPhasicitySpontaneityPropertiesThrombus Aging +---------+---------------+---------+-----------+----------+--------------+ CFV      Full           Yes      Yes                                 +---------+---------------+---------+-----------+----------+--------------+  SFJ      Full                                                        +---------+---------------+---------+-----------+----------+--------------+ FV Prox  Full                                                        +---------+---------------+---------+-----------+----------+--------------+ FV Mid   Full                                                        +---------+---------------+---------+-----------+----------+--------------+ FV DistalFull                                                        +---------+---------------+---------+-----------+----------+--------------+ PFV      Full                                                        +---------+---------------+---------+-----------+----------+--------------+ POP      Full           Yes      Yes                                 +---------+---------------+---------+-----------+----------+--------------+ PTV      Full                                                        +---------+---------------+---------+-----------+----------+--------------+ PERO     Full                                                        +---------+---------------+---------+-----------+----------+--------------+    Summary: RIGHT: - There is no evidence of  deep vein thrombosis in the lower extremity.  - No cystic structure found in the popliteal fossa.  LEFT: - There is no evidence of deep vein thrombosis in the lower extremity.  - No cystic structure found in the popliteal fossa.  *See table(s) above for measurements and observations.    Preliminary    DG Chest 2 View Result Date: 07/16/2023 CLINICAL DATA:  Worsening cough. EXAM: CHEST - 2 VIEW COMPARISON:  07/05/2023 FINDINGS: The heart size and mediastinal contours are within normal limits. Pulmonary hyperinflation again seen, consistent with COPD. New infiltrates are seen in both  lower lobes and inferior right upper lobe, consistent with pneumonia. No pleural effusion. IMPRESSION: New bilateral lower lobe and inferior right upper lobe infiltrates, consistent with pneumonia. COPD. Electronically Signed   By: Danae Orleans M.D.   On: 07/16/2023 13:39        Scheduled Meds:  calcium carbonate  1 tablet Oral BID   carbidopa-levodopa  1 tablet Oral TID AC   levothyroxine  25 mcg Oral Q0600   rivaroxaban  10 mg Oral Q breakfast   senna-docusate  2 tablet Oral BID   sodium chloride flush  3 mL Intravenous Q12H   Continuous Infusions:  ampicillin-sulbactam (UNASYN) IV            Glade Lloyd, MD [facility] 07/17/2023, 10:45 AM

## 2023-07-17 NOTE — Evaluation (Signed)
Clinical/Bedside Swallow Evaluation Patient Details  Name: Tracy Malone MRN: [id number] Date of Birth: 11/01/1931  Today's Date: 07/17/2023 Time: SLP Start Time (ACUTE ONLY): 1140 SLP Stop Time (ACUTE ONLY): 1205 SLP Time Calculation (min) (ACUTE ONLY): 25 min  Past Medical History:  Past Medical History:  Diagnosis Date   Acute lower UTI 11/22/2018   Bell's palsy 1966   Hx: right side facial droop, resolved per patient 04/02/19   Carotid artery disease (HCC) 2010   on vascular screening;unchanged 2013.(could not tolerate simvastatin, no other statins tried)--<30% blockage bilat 07/2011   Claustrophobia    Depression    treated in the past for years;stopped in 2010 for a years   Duodenal ulcer 1962   h/o   Dysrhythmia    ocassional PVC's, no current problems per patient on 04/02/19   Fibromyalgia    chronic fatigue, chronic abdominal pain   Frequent PVCs 07/2012   Seen by [facility]: benign, asymptomatic, normal EF   Gallstones 02/2021   on [place]   GERD (gastroesophageal reflux disease)    diet controlled   Glaucoma, narrow-angle    s/p laser surgery   History of hiatal hernia    during endoscopy   Hypothyroid 03/2007   Hypoxia 07/16/2023   IBS (irritable bowel syndrome)    Dr. Elnoria Howard   Ischemic colitis (HCC) 11/21/2018   no current problems per patient on 04/02/19   Lichenoid keratosis 12/03/2020   Dr.Stinehelfer   Ocular migraine    Osteoporosis 04/2010   Dr.Hawkes; later consulted Dr. Sharl Ma (2022)   Panic attack    Parkinson's disease (HCC) 06/23/2016   Recurrent UTI    has cystocele-Dr.Grewal   Shingles 1999   h/o   Superficial thrombophlebitis 03/2009   RLE   Trochanteric bursitis 12/2008   bilateral   Past Surgical History:  Past Surgical History:  Procedure Laterality Date   ABDOMINAL HYSTERECTOMY     BACK SURGERY  05/2019   CATARACT EXTRACTION, BILATERAL  1995, 1996   EYE SURGERY Bilateral    laser - glaucoma   Flexible sigmoidoscopy     FRACTURE  SURGERY  2020, Sept.  12th (!)   HIP PINNING,CANNULATED Right 07/07/2023   Procedure: PERCUTANEOUS FIXATION OF FEMORAL NECK;  Surgeon: London Sheer, MD;  Location: [facility] OR;  Service: Orthopedics;  Laterality: Right;   KYPHOPLASTY N/A 04/03/2019   Procedure: KYPHOPLASTY L1;  Surgeon: Venita Lick, MD;  Location: [facility] OR;  Service: Orthopedics;  Laterality: N/A;  90 mins   RADIOFREQUENCY ABLATION Bilateral 11/14/2022   L3-4, L4-5 bilateral ([facility])   THYROIDECTOMY, PARTIAL  09/2005   L nodule; Dr. Gerrit Friends   TONSILLECTOMY  1946   UPPER GI ENDOSCOPY  06/27/2012   VAGINAL HYSTERECTOMY  1971   and bladder repair.  Still has ovaries   WISDOM TOOTH EXTRACTION     HPI:  Tracy Malone is a 89 y.o. female with medical history significant of carotid stenosis, fibromyalgia, hypothyroidism, and Parkinson's who presented on 12/12 with a fall.  She has a R humerus and R hip fracture.  Patient has been on home hospice for FTT.  She got up today and had a mechanical fall, landing on R side and injuring both her R arm and leg. Pt underwent surgery for right femoral neck fracture on 12/14. She had an MBS on 07/09/23 which reported moderate oropharyngeal dysphagia and evaluating SLP suspecting that patient had an exacerbation of a baseline dysphagia. Regular solids and nectar thick liquids were recommended  to allow patient to order foods she desired but also to reduce aspiration risk with nectar thick liquids, recommendation for medications crushed in puree and to not use straws. Patient had discharged to SNF for rehab but returned on 12/23 due to not feeling well, dizziness, lethargy and confusion. In ER, she was found to have hypoxemia, bilateral lung crackles and diagnosed with PNA. SLP swallow evaluation ordered.    Assessment / Plan / Recommendation  Clinical Impression  Patient presents with clinical s/s of dysphagia as per this bedside swallow evaluation, however do not suspect a significant  change in swallow function since MBS c completed on 07/09/23. With nectar thick liquids, patient takes small cup sips and does exhibit instances of fairly immediate throat clearing and cough (approximately 30% of the time. ) She exhibits prolonged mastication and multiple swallows with soft solids (banana). Per patient's RN, she only has seemed to tolerate sips of Ensure shake but not solid foods and she also had difficulty with swallowing meds crushed in applesauce. SLP spoke with patient and daughter regarding the various options related to diet consistencies and discussed her dysphagia which is likely chronic as well as pros and cons of making changes in liquid and solid texture consistencies. Patient stated that she did not want pureed foods and she wants to be able to order foods from the menu. Her daughter is in agreement with this and she will remain on regular solid textures to give her options. As far as liquids, SLP discussed the benefit of nectar thick liquids in reducing but not eliminating her aspiration risk but also the fact that nectar thick liquids do not help with hydration as much as thin liquids do. Patient also stated that she feels that some of the nectar thick liquids are "too thick" and it is hard for her to swallow. After discussion, patient, daughter and SLP all in agreement to remain on nectar thick liquids at meal time but allow plain (not thickened) water in between meals and after oral care. (water protocol) SLP will plan to follow up at least one time to ensure all education has been completed and patient is on the least restrictive oral diet. SLP Visit Diagnosis: Dysphagia, oropharyngeal phase (R13.12)    Aspiration Risk  Moderate aspiration risk    Diet Recommendation Regular;Nectar-thick liquid    Liquid Administration via: Cup;No straw Medication Administration: Crushed with puree Supervision: Patient able to self feed;Full supervision/cueing for compensatory  strategies Compensations: Slow rate;Small sips/bites;Minimize environmental distractions Postural Changes: Seated upright at 90 degrees    Other  Recommendations Oral Care Recommendations: Oral care BID    Recommendations for follow up therapy are one component of a multi-disciplinary discharge planning process, led by the attending physician.  Recommendations may be updated based on patient status, additional functional criteria and insurance authorization.  Follow up Recommendations Skilled nursing-short term rehab (<3 hours/day)      Assistance Recommended at Discharge    Functional Status Assessment Patient has had a recent decline in their functional status and demonstrates the ability to make significant improvements in function in a reasonable and predictable amount of time.  Frequency and Duration min 1 x/week  1 week       Prognosis Prognosis for improved oropharyngeal function: Fair Barriers to Reach Goals: Severity of deficits;Time post onset      Swallow Study   General Date of Onset: 07/16/23 HPI: Tracy Malone is a 89 y.o. female with medical history significant of carotid stenosis, fibromyalgia, hypothyroidism,  and Parkinson's who presented on 12/12 with a fall.  She has a R humerus and R hip fracture.  Patient has been on home hospice for FTT.  She got up today and had a mechanical fall, landing on R side and injuring both her R arm and leg. Pt underwent surgery for right femoral neck fracture on 12/14. She had an MBS on 07/09/23 which reported moderate oropharyngeal dysphagia and evaluating SLP suspecting that patient had an exacerbation of a baseline dysphagia. Regular solids and nectar thick liquids were recommended to allow patient to order foods she desired but also to reduce aspiration risk with nectar thick liquids, recommendation for medications crushed in puree and to not use straws. Patient had discharged to SNF for rehab but returned on 12/23 due to not feeling  well, dizziness, lethargy and confusion. In ER, she was found to have hypoxemia, bilateral lung crackles and diagnosed with PNA. SLP swallow evaluation ordered. Type of Study: Bedside Swallow Evaluation Previous Swallow Assessment: BSE and MBS during recent admission last week Diet Prior to this Study: Regular;Mildly thick liquids (Level 2, nectar thick) Temperature Spikes Noted: No Respiratory Status: Nasal cannula History of Recent Intubation: No Behavior/Cognition: Alert;Cooperative;Pleasant mood;Requires cueing Oral Cavity Assessment: Within Functional Limits Oral Care Completed by SLP: No Oral Cavity - Dentition: Adequate natural dentition Vision: Functional for self-feeding Self-Feeding Abilities: Able to feed self Patient Positioning: Upright in bed Baseline Vocal Quality: Low vocal intensity Volitional Cough: Weak Volitional Swallow: Able to elicit    Oral/Motor/Sensory Function Overall Oral Motor/Sensory Function: Within functional limits   Ice Chips     Thin Liquid Thin Liquid: Not tested    Nectar Thick Nectar Thick Liquid: Impaired Presentation: Cup;Self Fed Pharyngeal Phase Impairments: Throat Clearing - Immediate;Cough - Immediate   Honey Thick Honey Thick Liquid: Not tested   Puree Puree: Not tested   Solid     Solid: Impaired Presentation: Self Fed Oral Phase Impairments: Impaired mastication Oral Phase Functional Implications: Right anterior spillage Pharyngeal Phase Impairments: Multiple swallows;Suspected delayed Swallow      Angela Nevin, MA, CCC-SLP Speech Therapy

## 2023-07-17 NOTE — Progress Notes (Signed)
Initial Nutrition Assessment  INTERVENTION:   -Ensure Plus High Protein po BID, each supplement provides 350 kcal and 20 grams of protein.   -Multivitamin with minerals daily  NUTRITION DIAGNOSIS:   Increased nutrient needs related to acute illness as evidenced by estimated needs.  GOAL:   Patient will meet greater than or equal to 90% of their needs  MONITOR:   PO intake, Supplement acceptance, Labs, Weight trends, I & O's  REASON FOR ASSESSMENT:   Consult Assessment of nutrition requirement/status  ASSESSMENT:   89 y.o. female with medical history significant of Parkinson's disease, suspected dementia, on hospice for failure to thrive prior to recent hospitalization from 07/05/2023 - 07/11/2023 for right hip fracture and right humerus fracture requiring percutaneous screw fixation of right hip fracture on 07/07/2023 with subsequent discharge to SNF.  She presented with increasing lethargy/confusion/agitation and low blood pressure.  On presentation, she was found to be hypoxic and chest x-ray showed possible pneumonia.  Patient was recently hospitalized last week following hip surgery. Pt was at rehab. Developed pneumonia.  Pt was on home hospice prior to surgery this month. No PO documented at this time. Likely still has increased needs as surgery was 10 days ago. Will order Ensure supplements and daily MVI.  Per weight records, weight has remained stable over this past year. Current weight: 84 lbs.   Medications: Tums, Senokot  Labs reviewed: CBGs: 111   NUTRITION - FOCUSED PHYSICAL EXAM:  Unable to complete, working remotely.  Diet Order:   Diet Order             Diet regular Room service appropriate? Yes; Fluid consistency: Nectar Thick  Diet effective now                   EDUCATION NEEDS:   No education needs have been identified at this time  Skin:  Skin Integrity Issues:: Stage I Stage I: buttocks  Last BM:  12/23  Height:   Ht Readings  from Last 1 Encounters:  07/16/23 4\' 8"  (1.422 m)    Weight:   Wt Readings from Last 1 Encounters:  07/16/23 38.1 kg    BMI:  Body mass index is 18.83 kg/m.  Estimated Nutritional Needs:   Kcal:  1350-1550  Protein:  60-75g  Fluid:  1.5L/day  Tilda Franco, MS, RD, LDN Inpatient Clinical Dietitian Contact via Secure chat

## 2023-07-17 NOTE — Progress Notes (Signed)
PT Cancellation Note  Patient Details Name: Tracy Malone MRN: [id number] DOB: [birth year]   Cancelled Treatment:    Reason Eval/Treat Not Completed: Fatigue/lethargy limiting ability to participate, assisted repositioning patient in bed, patient reports pain and very fatigued. Patient not tolerating  mobility  at this time. Will attempt mobility another time. Patient comes from SNF/rehab. Blanchard Kelch PT Acute Rehabilitation Services Office [phone] Weekend [phone]    Rada Hay 07/17/2023, 2:55 PM

## 2023-07-18 DIAGNOSIS — R0902 Hypoxemia: Secondary | ICD-10-CM | POA: Diagnosis not present

## 2023-07-18 LAB — COMPREHENSIVE METABOLIC PANEL
ALT: 13 U/L (ref 0–44)
AST: 14 U/L — ABNORMAL LOW (ref 15–41)
Albumin: 2.4 g/dL — ABNORMAL LOW (ref 3.5–5.0)
Alkaline Phosphatase: 60 U/L (ref 38–126)
Anion gap: 9 (ref 5–15)
BUN: 26 mg/dL — ABNORMAL HIGH (ref 8–23)
CO2: 26 mmol/L (ref 22–32)
Calcium: 8.5 mg/dL — ABNORMAL LOW (ref 8.9–10.3)
Chloride: 107 mmol/L (ref 98–111)
Creatinine, Ser: 0.51 mg/dL (ref 0.44–1.00)
GFR, Estimated: >60 mL/min (ref >60)
Glucose, Bld: 145 mg/dL — ABNORMAL HIGH (ref 70–99)
Potassium: 3.6 mmol/L (ref 3.5–5.1)
Sodium: 142 mmol/L (ref 135–145)
Total Bilirubin: 1 mg/dL (ref <1.2)
Total Protein: 5.3 g/dL — ABNORMAL LOW (ref 6.5–8.1)

## 2023-07-18 LAB — CBC WITH DIFFERENTIAL/PLATELET
Abs Immature Granulocytes: 0.21 10*3/uL — ABNORMAL HIGH (ref 0.00–0.07)
Basophils Absolute: 0.1 10*3/uL (ref 0.0–0.1)
Basophils Relative: 0 %
Eosinophils Absolute: 0.1 10*3/uL (ref 0.0–0.5)
Eosinophils Relative: 0 %
HCT: 25.2 % — ABNORMAL LOW (ref 36.0–46.0)
Hemoglobin: 7.9 g/dL — ABNORMAL LOW (ref 12.0–15.0)
Immature Granulocytes: 1 %
Lymphocytes Relative: 5 %
Lymphs Abs: 1.2 10*3/uL (ref 0.7–4.0)
MCH: 30.5 pg (ref 26.0–34.0)
MCHC: 31.3 g/dL (ref 30.0–36.0)
MCV: 97.3 fL (ref 80.0–100.0)
Monocytes Absolute: 1.1 10*3/uL — ABNORMAL HIGH (ref 0.1–1.0)
Monocytes Relative: 5 %
Neutro Abs: 20.8 10*3/uL — ABNORMAL HIGH (ref 1.7–7.7)
Neutrophils Relative %: 89 %
Platelets: 468 10*3/uL — ABNORMAL HIGH (ref 150–400)
RBC: 2.59 MIL/uL — ABNORMAL LOW (ref 3.87–5.11)
RDW: 13.9 % (ref 11.5–15.5)
WBC: 23.4 10*3/uL — ABNORMAL HIGH (ref 4.0–10.5)
nRBC: 0 % (ref 0.0–0.2)

## 2023-07-18 LAB — HEMOGLOBIN AND HEMATOCRIT, BLOOD
HCT: 18 % — ABNORMAL LOW (ref 36.0–46.0)
Hemoglobin: 5.6 g/dL — CL (ref 12.0–15.0)

## 2023-07-18 LAB — MAGNESIUM: Magnesium: 2.1 mg/dL (ref 1.7–2.4)

## 2023-07-18 MED ORDER — IPRATROPIUM-ALBUTEROL 0.5-2.5 (3) MG/3ML IN SOLN: 3.0000 mL | RESPIRATORY_TRACT | Status: DC | PRN

## 2023-07-18 MED ORDER — PANTOPRAZOLE SODIUM 40 MG IV SOLR
40.0000 mg | Freq: Two times a day (BID) | INTRAVENOUS | Status: DC
  Administered 2023-07-19 (×2): 40 mg via INTRAVENOUS
  Filled 2023-07-18 (×2): qty 10

## 2023-07-18 MED ORDER — SODIUM CHLORIDE 0.9% IV SOLUTION: Freq: Once | INTRAVENOUS | Status: AC

## 2023-07-18 MED ORDER — SODIUM CHLORIDE 0.9 % IV BOLUS
500.0000 mL | Freq: Once | INTRAVENOUS | Status: AC
  Administered 2023-07-18: 500 mL via INTRAVENOUS

## 2023-07-18 NOTE — Consult Note (Signed)
Consultation Note Date: 07/18/2023   Patient Name: Tracy Malone  DOB: [birth year]  MRN: [id number]  Age / Sex: 89 y.o., female  PCP: Joselyn Arrow, MD Referring Physician: Glade Lloyd, MD  Reason for Consultation: Establishing goals of care  HPI/Patient Profile: 89 y.o. female admitted on 07/16/2023  89 y.o. female with medical history significant of Parkinson's disease, suspected dementia, on hospice for failure to thrive prior to recent hospitalization from 07/05/2023 - 07/11/2023 for right hip fracture and right humerus fracture requiring percutaneous screw fixation of right hip fracture on 07/07/2023 with subsequent discharge to SNF.  She presented with increasing lethargy/confusion/agitation and low blood pressure.  On presentation, she was found to be hypoxic and chest x-ray showed possible pneumonia.    Clinical Assessment and Goals of Care: Patient came from [facility] rehab.  She was under hospice care for Parkinson's and dementia and failure to thrive.  She follows with Dr. Sherol Dade from neurology. Patient admitted with aspiration pneumonia.  Seen and evaluated by SLP with recommendations for regular diet and nectar thick liquids.  Trying to participate in physical therapy. Palliative consult for goals of care discussions. Palliative medicine is specialized medical care for people living with serious illness. It focuses on providing relief from the symptoms and stress of a serious illness. The goal is to improve quality of life for both the patient and the family. Goals of care: Broad aims of medical therapy in relation to the patient's values and preferences. Our aim is to provide medical care aimed at enabling patients to achieve the goals that matter most to them, given the circumstances of their particular medical situation and their constraints.  Patient seen, chart reviewed, call placed and was able  to reach daughter Lonna Cobb  [facility] Daughter Lonna Cobb [phone].  SUMMARY OF RECOMMENDATIONS   Goals of care discussions: Continue current mode of care, continue participation with PT, monitor oral intake.  Goals of care discussed with daughter Lonna Cobb.  Complete rehabilitation attempt at [facility] nursing facility upon discharge and then resume hospice services. Thank you for the consult.  Code Status/Advance Care Planning: DNR   Symptom Management:     Palliative Prophylaxis:  Delirium Protocol    Psycho-social/Spiritual:  Desire for further Chaplaincy support:yes Additional Recommendations: Caregiving  Support/Resources  Prognosis:  < 12 months  Discharge Planning: Skilled Nursing Facility for rehab with Palliative care service follow-up      Primary Diagnoses: Present on Admission:  Hypoxia  Anxiety state  Osteopenia  Hypothyroidism  Functional dyspepsia  Resting tremor  Closed fracture of neck of right femur (HCC)  Pneumonia   I have reviewed the medical record, interviewed the patient and family, and examined the patient. The following aspects are pertinent.  Past Medical History:  Diagnosis Date   Acute lower UTI 11/22/2018   Bell's palsy 1966   Hx: right side facial droop, resolved per patient 04/02/19   Carotid artery disease (HCC) 2010   on vascular screening;unchanged 2013.(could not tolerate simvastatin, no other statins tried)--<30% blockage bilat  07/2011   Claustrophobia    Depression    treated in the past for years;stopped in 2010 for a years   Duodenal ulcer 1962   h/o   Dysrhythmia    ocassional PVC's, no current problems per patient on 04/02/19   Fibromyalgia    chronic fatigue, chronic abdominal pain   Frequent PVCs 07/2012   Seen by [facility]: benign, asymptomatic, normal EF   Gallstones 02/2021   on [place]   GERD (gastroesophageal reflux disease)    diet controlled   Glaucoma, narrow-angle    s/p laser surgery    History of hiatal hernia    during endoscopy   Hypothyroid 03/2007   Hypoxia 07/16/2023   IBS (irritable bowel syndrome)    Dr. Elnoria Howard   Ischemic colitis (HCC) 11/21/2018   no current problems per patient on 04/02/19   Lichenoid keratosis 12/03/2020   Dr.Stinehelfer   Ocular migraine    Osteoporosis 04/2010   Dr.Hawkes; later consulted Dr. Sharl Ma (2022)   Panic attack    Parkinson's disease (HCC) 06/23/2016   Recurrent UTI    has cystocele-Dr.Grewal   Shingles 1999   h/o   Superficial thrombophlebitis 03/2009   RLE   Trochanteric bursitis 12/2008   bilateral   Social History   Socioeconomic History   Marital status: Married    Spouse name: Chrissie Noa   Number of children: 2   Years of education: Not on file   Highest education level: Not on file  Occupational History   Occupation: retired (school system)  Tobacco Use   Smoking status: Never   Smokeless tobacco: Never  Vaping Use   Vaping status: Never Used  Substance and Sexual Activity   Alcohol use: No    Alcohol/week: 0.0 standard drinks of alcohol   Drug use: No   Sexual activity: Not Currently    Partners: Male    Comment: Hysterectomy  Other Topics Concern   Not on file  Social History Narrative   Married.  Son lives in [place]; Daughter [name] lives in [place]; 2 grandchildren, 1 great grandson (in [place])      Updated 10/2022   Social Drivers of Health   Financial Resource Strain: Low Risk  (05/05/2021)   Overall Financial Resource Strain (CARDIA)    Difficulty of Paying Living Expenses: Not hard at all  Food Insecurity: No Food Insecurity (07/16/2023)   Hunger Vital Sign    Worried About Running Out of Food in the Last Year: Never true    Ran Out of Food in the Last Year: Never true  Transportation Needs: No Transportation Needs (07/16/2023)   PRAPARE - Administrator, Civil Service (Medical): No    Lack of Transportation (Non-Medical): No  Physical Activity: Not on file  Stress: Not on file   Social Connections: Unknown (12/06/2021)   [facility]   Social Network    Social Network: Not on file   Family History  Problem Relation Age of Onset   Heart disease Mother    Hypertension Mother    Hypertension Sister    Arthritis Sister    Heart disease Brother    Lung cancer Brother        lung   Diabetes Maternal Grandfather    HIV Son    Diabetes Granddaughter        type 1   Colon cancer Neg Hx    Esophageal cancer Neg Hx    Prostate cancer Neg Hx  Stomach cancer Neg Hx    Rectal cancer Neg Hx    Scheduled Meds:  calcium carbonate  1 tablet Oral BID   carbidopa-levodopa  1 tablet Oral TID AC   feeding supplement  237 mL Oral BID BM   levothyroxine  25 mcg Oral Q0600   multivitamin with minerals  1 tablet Oral Daily   rivaroxaban  10 mg Oral Q breakfast   senna-docusate  2 tablet Oral BID   sodium chloride flush  3 mL Intravenous Q12H   Continuous Infusions:  ampicillin-sulbactam (UNASYN) IV 3 g (07/18/23 0129)   PRN Meds:.ALPRAZolam, food thickener, oxyCODONE, polyethylene glycol Medications Prior to Admission:  Prior to Admission medications   Medication Sig Start Date End Date Taking? Authorizing Provider  acetaminophen (TYLENOL) 325 MG tablet Take 2 tablets (650 mg total) by mouth every 6 (six) hours as needed for mild pain (pain score 1-3). 07/11/23  Yes Ghimire, Werner Lean, MD  ALPRAZolam Prudy Feeler) 0.5 MG tablet TAKE FOUR TABLETS BY MOUTH DAILY AS NEEDED FOR ANXIETY Patient taking differently: Take 0.5 mg by mouth every 6 (six) hours as needed for anxiety. TAKE FOUR TABLETS BY MOUTH DAILY AS NEEDED FOR ANXIETY 07/11/23  Yes Ghimire, Werner Lean, MD  b complex vitamins capsule Take 1 capsule by mouth daily.   Yes [provider]  Carbidopa-Levodopa ER (SINEMET CR) 25-100 MG tablet controlled release TAKE ONE TABLET BY MOUTH THREE TIMES A DAY 06/28/23  Yes Sater, Pearletha Furl, MD  Cholecalciferol (VITAMIN D-3 PO) Take 1,000  Units by mouth 3 (three) times a week. Monday Wednesday Friday   Yes [provider]  dicyclomine (BENTYL) 10 MG capsule Take 1 capsule (10 mg total) by mouth every 6 (six) hours as needed for spasms. 05/29/23  Yes Cirigliano, Vito V, DO  feeding supplement (ENSURE ENLIVE / ENSURE PLUS) LIQD Take 237 mLs by mouth 2 (two) times daily between meals. 07/11/23  Yes Ghimire, Werner Lean, MD  FLUoxetine (PROZAC) 10 MG capsule Take 1 capsule (10 mg total) by mouth daily. 05/10/23  Yes Mozingo, Thereasa Solo, NP  levothyroxine (SYNTHROID) 25 MCG tablet Take 25 mcg by mouth daily before breakfast.   Yes [provider]  methocarbamol (ROBAXIN) 500 MG tablet Take 1 tablet (500 mg total) by mouth every 6 (six) hours as needed for muscle spasms. 07/11/23  Yes Ghimire, Werner Lean, MD  oxyCODONE (OXY IR/ROXICODONE) 5 MG immediate release tablet Take 1 tablet (5 mg total) by mouth every 6 (six) hours as needed for moderate pain (pain score 4-6). 07/11/23  Yes Ghimire, Werner Lean, MD  polyethylene glycol (MIRALAX) 17 g packet Take 17 g by mouth daily as needed. 04/20/22  Yes Gareth Eagle, PA-C  Probiotic Product (PROBIOTIC PO) Take 1 capsule by mouth 2 (two) times a week. Monday Thursday   Yes [provider]  rivaroxaban (XARELTO) 10 MG TABS tablet Take 1 tablet (10 mg total) by mouth daily for 21 days. 07/11/23 08/01/23 Yes Ghimire, Werner Lean, MD   Allergies  Allergen Reactions   Iodinated Contrast Media Anaphylaxis   Iodine Anaphylaxis    IV and topical forms. Unknown   Levsin [Hyoscyamine Sulfate]     Vision problems/pt has glaucoma   Salmon [Fish Allergy] Hives and Shortness Of Breath   Shellfish Allergy Anaphylaxis   Tramadol Swelling   Remeron [Mirtazapine] Other (See Comments)    Cause blurred vision and red eyes, pt has glaucoma   Aspirin Other (See Comments)    Sever stomach  pain due to ulcer scaring.   Bis Subcit-Metronid-Tetracyc Swelling    Tongue swelling. Face  tingling    Cephalexin Hives   Ciprofloxacin Diarrhea   Codeine Nausea And Vomiting   Cyclobenzaprine Other (See Comments)    Tingly/prickly sensation.    Darvocet [Propoxyphene N-Acetaminophen] Nausea And Vomiting   Demerol [Meperidine] Nausea Only   Dexlansoprazole Swelling    Redness, swelling and peeling of both feet, foot pain   Diphedryl [Diphenhydramine] Other (See Comments)    Increased pulse/small amount ok   Doxycycline Hyclate Other (See Comments)    GI intolerance.   Doxycycline Hyclate      GI intolerance   Epinephrine Other (See Comments)    Breathing problems/fainting   Erythromycin Other (See Comments)    GI intolerance.    Fish Oil     breathing problems/hives   Hydrocodone Swelling   Hyoscyamine     eye pain   Latex Other (See Comments)    Gloves ok.  Skin gets red from elastic in underwear and latex bandaides.   Nitrofurantoin Diarrhea   Nsaids Other (See Comments)   Other     migraines: increased pulse, faint, diarrhea   Prednisone Other (See Comments)    Headache Other reaction(s): headache   Ra Diphedryl Allergy [Diphenhydramine Hcl]     Other reaction(s): increased pulse small dose okay   Sertraline Hcl Swelling and Other (See Comments)    Migraine Swelling of tongue/lip (09/2012) Other reaction(s): Unknown   Shellfish-Derived Products     Other reaction(s): Unknown   Sulfa Antibiotics Other (See Comments)    Increased pulse, fainting, diarrhea, thrush   Wellbutrin [Bupropion] Other (See Comments)    Headaches   Xylocaine [Lidocaine Hcl]     With epinephrine, given by dentist.  Speeded up heart rate and she passed out (occured twice, at dentist)   Xylocaine [Lidocaine]     Unknown   Biaxin [Clarithromycin] Rash    Started after completing 10 day course of 2000 mg /day, Lips swelling   Fluoxetine Other (See Comments)    Dry mouth   Ibuprofen Other (See Comments)    Motrin ok with a GI effect. rash Motrin okay with a GI effect    Meperidine Hcl Nausea And Vomiting   Penicillins Hives and Rash    Tolerated Zosyn in 2020    Review of Systems Episodic pain Physical Exam Lady resting in bed No acute distress Appears frail Chronic generalized weakness Shallow regular breath sounds  Vital Signs: BP (!) 102/44   Pulse 78   Temp 98.1 F (36.7 C) (Oral)   Resp 18   Ht 4\' 8"  (1.422 m)   Wt 38.1 kg   LMP  (LMP Unknown)   SpO2 94%   BMI 18.83 kg/m  Pain Scale: 0-10 POSS *See Group Information*: S-Acceptable,Sleep, easy to arouse Pain Score: 3    SpO2: SpO2: 94 % O2 Device:SpO2: 94 % O2 Flow Rate: .O2 Flow Rate (L/min): 2 L/min  IO: Intake/output summary:  Intake/Output Summary (Last 24 hours) at 07/18/2023 1328 Last data filed at 07/18/2023 0300 Gross per 24 hour  Intake 340 ml  Output --  Net 340 ml    LBM: Last BM Date : 07/18/23 Baseline Weight: Weight: 38.1 kg Most recent weight: Weight: 38.1 kg     Palliative Assessment/Data:   PPS 50%  Time In:  12.30 Time Out:  1330 Time Total:  60  Greater than 50%  of this time was spent counseling and coordinating  care related to the above assessment and plan.  Signed by: Rosalin Hawking, MD   Please contact Palliative Medicine Team phone at [phone] for questions and concerns.  For individual provider: See Loretha Stapler

## 2023-07-18 NOTE — Progress Notes (Incomplete)
    Patient Name: Tracy Malone           DOB: 1932/04/26  MRN: [id number]      Admission Date: 07/16/2023  Attending Provider: Glade Lloyd, MD  Primary Diagnosis: Hypoxia   Level of care: Med-Surg    CROSS COVER NOTE   Date of Service   07/18/2023   Tracy Malone, 89 y.o. female, was admitted on 07/16/2023 for Hypoxia.    HPI/Events of Note   Acute Blood Loss Anemia-nursing staff reporting liquid maroon stool Anemia of Chronic Disease  Hemoglobin on admission 11.8--> 9.3 --> 7.9 --> 5.6 Baseline hemoglobin ~ 11-12 earlier this December Patient had recent hospitalization from 07/05/2023 - 07/11/2023 for right hip fracture and right humerus fracture requiring percutaneous screw fixation of right hip fracture on 07/07/2023 with subsequent discharge to SNF.  Patient was discharged on Xarelto for DVT prophylaxis per orthopedic team. Last dose of Xarelto was given on 12/25 at 0938. No acute changes reported. BP soft 80/40 MAP 50's c/o SOB, dizziness. Patient denies abdominal discomfort, nausea, vomiting.  Has history of ulcer (1962).  No hematochezia or other bleeding reported.    Bedside Assessment:  Patient is awake, A/O x***, with no associated distress.  Appears pale.  Respiratory: Bilaterally clear, no wheezing, no crackles. Normal effort. No accessory muscle use.  Cardiovascular: Regular rate and rhythm. No extremity edema. 2+ pedal pulses. No carotid bruits.  Abdomen: Abdomen is soft and nontender.  Positive bowel sounds in all quadrants.    Addendum: Critical hemoglobin-5.6.  Will transfuse 2 units PRBC.   Interventions/ Plan   Occult blood card, stool H&H Fluid bolus, 500 cc Neb treatment as needed Blood transfusion, 2 unit PRBC Recheck H&H after transfusion is completed, transfuse if HGB <7        Anthoney Harada, DNP, ACNPC- AG [facility] [place]

## 2023-07-18 NOTE — Evaluation (Signed)
Physical Therapy Evaluation Patient Details Name: Tracy Malone MRN: [id number] DOB: 1931-08-16 Today's Date: 07/18/2023  History of Present Illness  89 y.o. female admitted 07/16/23 [facility] with PNA. Pt recently admitted  07/05/23 after fall sustaining R femoral neck fx, R proximal humerus fx (non-operative management). S/p R femur fixation 12/14. Of note, pt on home hospice for failure to thrive. Other PMH includes Parkinsons, fibromyalgia, carotid stenosis, kyphoplasty.  Clinical Impression  Pt admitted with above diagnosis. Pt found to be very heavily soiled in large amount of liquid and solid BM. Assisted pt to bedside commode with total assist for bed mobility and for stand pivot transfer. Max assist to stand for ~90 seconds for clean up of perineal area. Pt is quite weak and deconditioned. At end of session in semi reclined position in bed she reported dizziness, BP was 102/44, SpO2 94%. Pt is pleasant and oriented x 4. Patient will benefit from continued inpatient follow up therapy, <3 hours/day.  Pt currently with functional limitations due to the deficits listed below (see PT Problem List). Pt will benefit from acute skilled PT to increase their independence and safety with mobility to allow discharge.           If plan is discharge home, recommend the following: Two people to help with walking and/or transfers;A lot of help with bathing/dressing/bathroom;Assistance with cooking/housework;Assist for transportation;Help with stairs or ramp for entrance   Can travel by private vehicle   No    Equipment Recommendations None recommended by PT  Recommendations for Other Services       Functional Status Assessment Patient has had a recent decline in their functional status and demonstrates the ability to make significant improvements in function in a reasonable and predictable amount of time.     Precautions / Restrictions Precautions Precautions: Fall Precaution Comments: sling  RUE Required Braces or Orthoses: Sling Restrictions Weight Bearing Restrictions Per Provider Order: Yes RUE Weight Bearing Per Provider Order: Non weight bearing RLE Weight Bearing Per Provider Order: Weight bearing as tolerated Other Position/Activity Restrictions: Maintain RUE in shoulder immobilizer. WBAT RLE      Mobility  Bed Mobility Overal bed mobility: Needs Assistance Bed Mobility: Rolling, Sit to Supine, Sidelying to Sit Rolling: +2 for physical assistance, Total assist Sidelying to sit: +2 for physical assistance, +2 for safety/equipment, Total assist   Sit to supine: Total assist, +2 for physical assistance   General bed mobility comments: pt able to minimally assist (~10%) with rolling and sidelying to sit, limited by pain/fatigue    Transfers Overall transfer level: Needs assistance Equipment used: 1 person hand held assist Transfers: Sit to/from Stand, Bed to chair/wheelchair/BSC Sit to Stand: Total assist, +2 safety/equipment Stand pivot transfers: Total assist, +2 safety/equipment         General transfer comment: pt able to bear weight through BLE but required total assist to power up and to pivot bed to bedside commode then back to bed. In semi-reclined position in bed pt reported feeling dizzy, BP 102/44, SpO2 94% on room air.    Ambulation/Gait               General Gait Details: unable  Stairs            Wheelchair Mobility     Tilt Bed    Modified Rankin (Stroke Patients Only)       Balance Overall balance assessment: Needs assistance Sitting-balance support: Single extremity supported, Feet supported Sitting balance-Leahy Scale: Poor Sitting balance -  Comments: sitting EOB required single UE support   Standing balance support: Single extremity supported, During functional activity Standing balance-Leahy Scale: Zero Standing balance comment: with L HHA, max A to maintain standing during pericare                              Pertinent Vitals/Pain Pain Assessment Pain Score: 5  Pain Location: R knee, neck Pain Descriptors / Indicators: Aching, Grimacing, Guarding, Moaning Pain Intervention(s): Limited activity within patient's tolerance, Monitored during session, Premedicated before session, Repositioned    Home Living Family/patient expects to be discharged to:: Skilled nursing facility Living Arrangements: Spouse/significant other Available Help at Discharge: Family;Available PRN/intermittently (Hospice) Type of Home: House Home Access: Level entry       Home Layout: Two level;Able to live on main level with bedroom/bathroom Home Equipment: Shower seat;Cane - single point Additional Comments: Hospice comes 1x/wk for medical checks    Prior Function Prior Level of Function : Needs assist             Mobility Comments: Prior to 07/05/23 admission pt was at home where she was mod indep ambulating with [facility]; reports she did not leave house much. Pt admitted this time from [facility], where pt reports she was not walking. ADLs Comments: Both patient and husband are on hospice and have aides who come in to assist with IADLs. Aide also assist with bathing. Comes once a day for "as many hours as needed".     Extremity/Trunk Assessment   Upper Extremity Assessment Upper Extremity Assessment: Defer to OT evaluation RUE Deficits / Details: RUE in sling RUE: Unable to fully assess due to immobilization    Lower Extremity Assessment Lower Extremity Assessment: Generalized weakness RLE Deficits / Details: R knee ext +3/5    Cervical / Trunk Assessment Cervical / Trunk Assessment: Kyphotic  Communication   Communication Communication: No apparent difficulties  Cognition Arousal: Alert Behavior During Therapy: WFL for tasks assessed/performed Overall Cognitive Status: Within Functional Limits for tasks assessed                                 General Comments: Pt following  conversation and aware of her medical situation. Following commands and demonstrating good problem solving.        General Comments      Exercises     Assessment/Plan    PT Assessment Patient needs continued PT services  PT Problem List Decreased strength;Decreased range of motion;Decreased activity tolerance;Decreased balance;Decreased mobility;Decreased knowledge of use of DME;Decreased knowledge of precautions;Pain       PT Treatment Interventions DME instruction;Gait training;Stair training;Functional mobility training;Therapeutic activities;Therapeutic exercise;Balance training;Patient/family education    PT Goals (Current goals can be found in the Care Plan section)  Acute Rehab PT Goals Patient Stated Goal: decreased pain, return home PT Goal Formulation: With patient Time For Goal Achievement: 07/22/23    Frequency Min 1X/week     Co-evaluation PT/OT/SLP Co-Evaluation/Treatment: Yes Reason for Co-Treatment: For patient/therapist safety;To address functional/ADL transfers PT goals addressed during session: Balance;Mobility/safety with mobility OT goals addressed during session: ADL's and self-care       AM-PAC PT "6 Clicks" Mobility  Outcome Measure Help needed turning from your back to your side while in a flat bed without using bedrails?: Total Help needed moving from lying on your back to sitting on the side of a flat bed without  using bedrails?: Total Help needed moving to and from a bed to a chair (including a wheelchair)?: Total Help needed standing up from a chair using your arms (e.g., wheelchair or bedside chair)?: Total Help needed to walk in hospital room?: Total Help needed climbing 3-5 steps with a railing? : Total 6 Click Score: 6    End of Session Equipment Utilized During Treatment: Gait belt Activity Tolerance: Patient limited by pain;Patient limited by fatigue Patient left: in bed;with call bell/phone within reach;with bed alarm set Nurse  Communication: Mobility status;Other (comment) (pt and bed heavily soiled in BM) PT Visit Diagnosis: Other abnormalities of gait and mobility (R26.89);Muscle weakness (generalized) (M62.81)    Time: 8119-1478 PT Time Calculation (min) (ACUTE ONLY): 57 min   Charges:   PT Evaluation $PT Eval Moderate Complexity: 1 Mod PT Treatments $Therapeutic Activity: 8-22 mins PT General Charges $$ ACUTE PT VISIT: 1 Visit         Tamala Ser PT 07/18/2023  Acute Rehabilitation Services  Office [phone]

## 2023-07-18 NOTE — Progress Notes (Addendum)
PROGRESS NOTE    Tracy Malone  ZOX:[id number] DOB: [birth year] DOA: 07/16/2023 PCP: Joselyn Arrow, MD   Brief Narrative:   89 y.o. female with medical history significant of Parkinson's disease, suspected dementia, on hospice for failure to thrive prior to recent hospitalization from 07/05/2023 - 07/11/2023 for right hip fracture and right humerus fracture requiring percutaneous screw fixation of right hip fracture on 07/07/2023 with subsequent discharge to SNF.  She presented with increasing lethargy/confusion/agitation and low blood pressure.  On presentation, she was found to be hypoxic and chest x-ray showed possible pneumonia.  She was started on IV antibiotics.  Assessment & Plan:   Possible bilateral pneumonia with concern for aspiration pneumonia Acute respiratory failure with hypoxia -Currently on 2 L oxygen by nasal cannula.  Respiratory panel PCR negative.  Continue IV Unasyn.  Wean off oxygen as able. -Diet as per SLP recommendations  Leukocytosis -Worsening to 23.4 today.  Monitor.  Anemia of chronic disease -From chronic illnesses.  Hemoglobin stable.  Monitor intermittently  Thrombocytosis -Possibly reactive.  Monitor intermittently  Recent closed fracture of of neck of right femur -Status post nailing on 07/07/2023 and subsequent discharge to SNF on oral Xarelto.  Continue with the same.  PT recommended SNF placement again.  Consult TOC.  Parkinson disease Possible Parkinson's dementia Tremors -Continue carbidopa levodopa.  Outpatient follow-up with neurology.  Fall precautions.  Hypothyroidism -Continue levothyroxine  Osteopenia Recent right humerus fracture -Humerus fracture was treated conservatively.  Continue with sling for the right upper extremity.  Outpatient follow-up with orthopedics.  Continue calcium carbonate  Anxiety -Continue Xanax as needed.  Goals of care - palliative care consultation for goals of care discussion is pending.  Patient  apparently was on hospice prior to her recent surgical intervention for failure to thrive.   DVT prophylaxis: Xarelto Code Status: DNR Family Communication: None at bedside Disposition Plan: Status is: Inpatient Remains inpatient appropriate because: Of severity of illness    Consultants: palliative care  Procedures: None  Antimicrobials:  Anti-infectives (From admission, onward)    Start     Dose/Rate Route Frequency Ordered Stop   07/18/23 1400  levofloxacin (LEVAQUIN) IVPB 750 mg  Status:  Discontinued        750 mg 100 mL/hr over 90 Minutes Intravenous Every 48 hours 07/16/23 1901 07/17/23 0940   07/17/23 2200  vancomycin (VANCOREADY) IVPB 750 mg/150 mL  Status:  Discontinued        750 mg 150 mL/hr over 60 Minutes Intravenous Every 48 hours 07/16/23 1933 07/17/23 0940   07/17/23 1400  Ampicillin-Sulbactam (UNASYN) 3 g in sodium chloride 0.9 % 100 mL IVPB        3 g 200 mL/hr over 30 Minutes Intravenous Every 12 hours 07/17/23 0940     07/16/23 2000  vancomycin (VANCOREADY) IVPB 750 mg/150 mL        750 mg 150 mL/hr over 60 Minutes Intravenous  Once 07/16/23 1933 07/16/23 2131   07/16/23 1645  levofloxacin (LEVAQUIN) IVPB 750 mg        750 mg 100 mL/hr over 90 Minutes Intravenous  Once 07/16/23 1630 07/16/23 1844   07/16/23 1645  metroNIDAZOLE (FLAGYL) IVPB 500 mg  Status:  Discontinued        500 mg 100 mL/hr over 60 Minutes Intravenous Every 12 hours 07/16/23 1630 07/17/23 0940        Subjective: Patient seen and examined at bedside.  No agitation, seizures, vomiting reported.  Remains confused. Objective: Vitals:  07/17/23 0600 07/17/23 1053 07/17/23 1956 07/18/23 0459  BP: (!) 107/52 (!) 104/42 (!) 116/46 (!) 119/42  Pulse: 68 80 87 80  Resp:  18 15 18   Temp: 97.9 F (36.6 C) 98.2 F (36.8 C) (!) 97.4 F (36.3 C) 98.1 F (36.7 C)  TempSrc: Oral Oral  Oral  SpO2: 97% 98% 98% 94%  Weight:      Height:        Intake/Output Summary (Last 24 hours) at  07/18/2023 0750 Last data filed at 07/18/2023 0300 Gross per 24 hour  Intake 340 ml  Output --  Net 340 ml   Filed Weights   07/16/23 1131  Weight: 38.1 kg    Examination:  General: On 2 L oxygen via.  No distress.  Chronically ill and deconditioned looking.  Elderly female lying in bed. ENT/neck: No thyromegaly.  JVD is not elevated  respiratory: Decreased breath sounds at bases bilaterally with some crackles; no wheezing  CVS: S1-S2 heard, rate controlled currently Abdominal: Soft, nontender, slightly distended; no organomegaly, bowel sounds are heard Extremities: Trace lower extremity edema; no cyanosis  CNS: Remains confused.  Very slow to respond.  No focal neurologic deficit.  Moves extremities Lymph: No obvious lymphadenopathy Skin: No obvious ecchymosis/lesions  psych: Currently not agitated.  Extremely flat affect. musculoskeletal: Right arm is in a sling    Data Reviewed: I have personally reviewed following labs and imaging studies  CBC: Recent Labs  Lab 07/16/23 1156 07/17/23 0655 07/18/23 0359  WBC 17.3* 22.8* 23.4*  NEUTROABS  --   --  20.8*  HGB 11.8* 9.3* 7.9*  HCT 37.5 30.2* 25.2*  MCV 95.9 99.3 97.3  PLT 524* 419* 468*   Basic Metabolic Panel: Recent Labs  Lab 07/16/23 1156 07/17/23 0027 07/18/23 0359  NA 144 143 142  K 3.6 4.1 3.6  CL 107 110 107  CO2 28 24 26   GLUCOSE 126* 117* 145*  BUN 17 22 26*  CREATININE 0.38* 0.48 0.51  CALCIUM 8.6* 8.1* 8.5*  MG  --   --  2.1   GFR: Estimated Creatinine Clearance: 27.3 mL/min (by C-G formula based on SCr of 0.51 mg/dL). Liver Function Tests: Recent Labs  Lab 07/16/23 1156 07/18/23 0359  AST 21 14*  ALT 7 13  ALKPHOS 67 60  BILITOT 1.2* 1.0  PROT 6.2* 5.3*  ALBUMIN 3.0* 2.4*   No results for input(s): "LIPASE", "AMYLASE" in the last 168 hours. No results for input(s): "AMMONIA" in the last 168 hours. Coagulation Profile: Recent Labs  Lab 07/17/23 0028  INR 1.2   Cardiac  Enzymes: No results for input(s): "CKTOTAL", "CKMB", "CKMBINDEX", "TROPONINI" in the last 168 hours. BNP (last 3 results) No results for input(s): "PROBNP" in the last 8760 hours. HbA1C: No results for input(s): "HGBA1C" in the last 72 hours. CBG: Recent Labs  Lab 07/16/23 1215  GLUCAP 111*   Lipid Profile: No results for input(s): "CHOL", "HDL", "LDLCALC", "TRIG", "CHOLHDL", "LDLDIRECT" in the last 72 hours. Thyroid Function Tests: No results for input(s): "TSH", "T4TOTAL", "FREET4", "T3FREE", "THYROIDAB" in the last 72 hours. Anemia Panel: No results for input(s): "VITAMINB12", "FOLATE", "FERRITIN", "TIBC", "IRON", "RETICCTPCT" in the last 72 hours. Sepsis Labs: No results for input(s): "PROCALCITON", "LATICACIDVEN" in the last 168 hours.  Recent Results (from the past 240 hours)  SARS Coronavirus 2 by RT PCR (hospital order, performed in [facility] hospital lab) *cepheid single result test* Anterior Nasal Swab     Status: None   Collection Time:  07/16/23  9:59 PM   Specimen: Anterior Nasal Swab  Result Value Ref Range Status   SARS Coronavirus 2 by RT PCR NEGATIVE NEGATIVE Final    Comment: Performed at [facility], [address] [place], [place] [address]  Respiratory (~20 pathogens) panel by PCR     Status: None   Collection Time: 07/16/23  9:59 PM   Specimen: Nasopharyngeal Swab; Respiratory  Result Value Ref Range Status   Adenovirus NOT DETECTED NOT DETECTED Final   Coronavirus 229E NOT DETECTED NOT DETECTED Final    Comment: (NOTE) The Coronavirus on the Respiratory Panel, DOES NOT test for the novel  Coronavirus (2019 nCoV)    Coronavirus HKU1 NOT DETECTED NOT DETECTED Final   Coronavirus NL63 NOT DETECTED NOT DETECTED Final   Coronavirus OC43 NOT DETECTED NOT DETECTED Final   Metapneumovirus NOT DETECTED NOT DETECTED Final   Rhinovirus / Enterovirus NOT DETECTED NOT DETECTED Final   Influenza A NOT DETECTED NOT DETECTED Final   Influenza B NOT DETECTED  NOT DETECTED Final   Parainfluenza Virus 1 NOT DETECTED NOT DETECTED Final   Parainfluenza Virus 2 NOT DETECTED NOT DETECTED Final   Parainfluenza Virus 3 NOT DETECTED NOT DETECTED Final   Parainfluenza Virus 4 NOT DETECTED NOT DETECTED Final   Respiratory Syncytial Virus NOT DETECTED NOT DETECTED Final   Bordetella pertussis NOT DETECTED NOT DETECTED Final   Bordetella Parapertussis NOT DETECTED NOT DETECTED Final   Chlamydophila pneumoniae NOT DETECTED NOT DETECTED Final   Mycoplasma pneumoniae NOT DETECTED NOT DETECTED Final    Comment: Performed at [facility], [address], [place], [place] [address]  Culture, blood (Routine X 2) w Reflex to ID Panel     Status: None (Preliminary result)   Collection Time: 07/16/23 10:12 PM   Specimen: BLOOD  Result Value Ref Range Status   Specimen Description   Final    BLOOD LEFT ANTECUBITAL Performed at [facility], [address] [place], [place] [address]    Special Requests   Final    BOTTLES DRAWN AEROBIC AND ANAEROBIC Blood Culture results may not be optimal due to an inadequate volume of blood received in culture bottles Performed at [facility], [address] [place], [place] [address]    Culture   Final    NO GROWTH 2 DAYS Performed at [facility], [address], [place], [place] [address]    Report Status PENDING  Incomplete  Culture, blood (Routine X 2) w Reflex to ID Panel     Status: None (Preliminary result)   Collection Time: 07/17/23 12:28 AM   Specimen: BLOOD LEFT ARM  Result Value Ref Range Status   Specimen Description   Final    BLOOD LEFT ARM Performed at [facility], [address], [place], [place] [address]    Special Requests   Final    BOTTLES DRAWN AEROBIC ONLY Blood Culture results may not be optimal due to an inadequate volume of blood received in culture bottles Performed at [facility], [address], [place], [place] [address]     Culture   Final    NO GROWTH 1 DAY Performed at [facility], [address], [place], [place] [address]    Report Status PENDING  Incomplete  MRSA Next Gen by PCR, Nasal     Status: None   Collection Time: 07/17/23 12:35 AM   Specimen: Nasal Mucosa; Nasal Swab  Result Value Ref Range Status   MRSA by PCR Next Gen NOT DETECTED NOT DETECTED Final    Comment: (NOTE) The GeneXpert MRSA Assay (FDA approved for NASAL specimens only), is one component of a comprehensive MRSA colonization surveillance program. It is not intended to diagnose MRSA infection nor to guide or monitor treatment for MRSA infections. Test performance is not FDA approved in patients less than 45 years old. Performed at [facility], [address], [place], [place] [address]          Radiology Studies: VAS [place] LOWER EXTREMITY VENOUS (DVT) Result Date: 07/17/2023  Lower Venous DVT Study Patient Name:  KATTYA LOCKER  Date of Exam:   07/17/2023 Medical Rec #: [id number]        Accession #:    [id number] Date of Birth: 1932-06-16         Patient Gender: F Patient Age:   35 years Exam Location:  [facility] Procedure:      VAS [place] LOWER EXTREMITY VENOUS (DVT) Referring Phys: [facility] GOEL --------------------------------------------------------------------------------  Indications: Swelling.  Risk Factors: Trauma. Limitations: Poor ultrasound/tissue interface and patient positioning, patient immobility, patient pain tolerance. Comparison Study: No prior studies. Performing Technologist: Chanda Busing RVT  Examination Guidelines: A complete evaluation includes B-mode imaging, spectral Doppler, color Doppler, and power Doppler as needed of all accessible portions of each vessel. Bilateral testing is considered an integral part of a complete examination. Limited examinations for reoccurring indications may be performed as noted. The reflux portion of the exam is performed with the patient in reverse  Trendelenburg.  +---------+---------------+---------+-----------+----------+--------------+ RIGHT    CompressibilityPhasicitySpontaneityPropertiesThrombus Aging +---------+---------------+---------+-----------+----------+--------------+ CFV      Full           Yes      Yes                                 +---------+---------------+---------+-----------+----------+--------------+ SFJ      Full                                                        +---------+---------------+---------+-----------+----------+--------------+ FV Prox  Full                                                        +---------+---------------+---------+-----------+----------+--------------+ FV Mid   Full                                                        +---------+---------------+---------+-----------+----------+--------------+ FV DistalFull                                                        +---------+---------------+---------+-----------+----------+--------------+ PFV      Full                                                        +---------+---------------+---------+-----------+----------+--------------+  POP      Full           Yes      Yes                                 +---------+---------------+---------+-----------+----------+--------------+ PTV      Full                                                        +---------+---------------+---------+-----------+----------+--------------+ PERO     Full                                                        +---------+---------------+---------+-----------+----------+--------------+   +---------+---------------+---------+-----------+----------+--------------+ LEFT     CompressibilityPhasicitySpontaneityPropertiesThrombus Aging +---------+---------------+---------+-----------+----------+--------------+ CFV      Full           Yes      Yes                                  +---------+---------------+---------+-----------+----------+--------------+ SFJ      Full                                                        +---------+---------------+---------+-----------+----------+--------------+ FV Prox  Full                                                        +---------+---------------+---------+-----------+----------+--------------+ FV Mid   Full                                                        +---------+---------------+---------+-----------+----------+--------------+ FV DistalFull                                                        +---------+---------------+---------+-----------+----------+--------------+ PFV      Full                                                        +---------+---------------+---------+-----------+----------+--------------+ POP      Full           Yes      Yes                                 +---------+---------------+---------+-----------+----------+--------------+  PTV      Full                                                        +---------+---------------+---------+-----------+----------+--------------+ PERO     Full                                                        +---------+---------------+---------+-----------+----------+--------------+     Summary: RIGHT: - There is no evidence of deep vein thrombosis in the lower extremity.  - No cystic structure found in the popliteal fossa.  LEFT: - There is no evidence of deep vein thrombosis in the lower extremity.  - No cystic structure found in the popliteal fossa.  *See table(s) above for measurements and observations. Electronically signed by Gerarda Fraction on 07/17/2023 at 11:46:13 AM.    Final    DG Chest 2 View Result Date: 07/16/2023 CLINICAL DATA:  Worsening cough. EXAM: CHEST - 2 VIEW COMPARISON:  07/05/2023 FINDINGS: The heart size and mediastinal contours are within normal limits. Pulmonary hyperinflation again seen, consistent  with COPD. New infiltrates are seen in both lower lobes and inferior right upper lobe, consistent with pneumonia. No pleural effusion. IMPRESSION: New bilateral lower lobe and inferior right upper lobe infiltrates, consistent with pneumonia. COPD. Electronically Signed   By: Danae Orleans M.D.   On: 07/16/2023 13:39        Scheduled Meds:  calcium carbonate  1 tablet Oral BID   carbidopa-levodopa  1 tablet Oral TID AC   feeding supplement  237 mL Oral BID BM   levothyroxine  25 mcg Oral Q0600   multivitamin with minerals  1 tablet Oral Daily   rivaroxaban  10 mg Oral Q breakfast   senna-docusate  2 tablet Oral BID   sodium chloride flush  3 mL Intravenous Q12H   Continuous Infusions:  ampicillin-sulbactam (UNASYN) IV 3 g (07/18/23 0129)          Glade Lloyd, MD [facility] 07/18/2023, 7:50 AM

## 2023-07-18 NOTE — Evaluation (Signed)
Occupational Therapy Evaluation Patient Details Name: Tracy Malone MRN: [id number] DOB: 1932/07/11 Today's Date: 07/18/2023   History of Present Illness 89 yr old female admitted 07/16/23 from short term SNF with malaise, lethargy, and dizziness. Pt found to have suspected PNA. Pt with recent hospitalization after a fall sustaining R femoral neck fx, R proximal humerus fx (non-operative management of R UE); s/p R femur fixation 12/14. Of note, pt on home hospice for failure to thrive. Other PMH includes Parkinsons, fibromyalgia, carotid stenosis, kyphoplasty.   Clinical Impression   The pt is currently limited by the below listed deficits which compromise her ADL performance (see OT problem list). During the session today, she required max to total assist x2 for tasks, including supine to sit, to perform a squat pivot transfer onto and off bedside commode, and for toileting at bedside commode level. She reported 4/10 R knee pain & she was also noted to be with significant deconditioning and generalized weakness.Increased time and assist required to assist pt with toileting and peri-hygiene in bed then at bedside commode level, as she found to be notably soiled of loose bowel at the start of the session. She was also with dizziness. Her blood pressure was taken at the end of the session with the pt in bed and was noted to be 102/44. She will benefit from further OT services to facilitate progressive ADL performance & to decrease the risk for further weakness and deconditioning. Patient will benefit from continued inpatient follow up therapy, <3 hours/day       If plan is discharge home, recommend the following: A lot of help with bathing/dressing/bathroom;A lot of help with walking and/or transfers;Assistance with cooking/housework;Direct supervision/assist for medications management    Functional Status Assessment  Patient has had a recent decline in their functional status and demonstrates the  ability to make significant improvements in function in a reasonable and predictable amount of time.  Equipment Recommendations  Other (comment) (defer to next level of care)    Recommendations for Other Services       Precautions / Restrictions Precautions Precautions: Fall Precaution Comments: sling RUE Required Braces or Orthoses: Sling Restrictions RUE Weight Bearing Per Provider Order: Non weight bearing RLE Weight Bearing Per Provider Order: Weight bearing as tolerated Other Position/Activity Restrictions: Maintain RUE in shoulder immobilizer. WBAT RLE      Mobility Bed Mobility Overal bed mobility: Needs Assistance Bed Mobility: Rolling, Sit to Supine, Sidelying to Sit Rolling: +2 for physical assistance, Total assist Sidelying to sit: +2 for physical assistance, +2 for safety/equipment, Total assist   Sit to supine: Total assist, +2 for physical assistance   General bed mobility comments: pt able to minimally assist (~10%) with rolling and sidelying to sit, limited by pain/fatigue    Transfers Overall transfer level: Needs assistance   Transfers: Sit to/from Stand, Bed to chair/wheelchair/BSC     Squat pivot transfers: Total assist, +2 physical assistance       General transfer comment: pt able to bear weight through BLE but required total assist to power up and to pivot bed to bedside commode then back to bed. In semi-reclined position in bed pt reported feeling dizzy, BP 102/44, SpO2 94% on room air.      Balance     Sitting balance-Leahy Scale: Poor       Standing balance-Leahy Scale: Zero            ADL either performed or assessed with clinical judgement   ADL Overall ADL's :  Needs assistance/impaired Eating/Feeding: Minimal assistance Eating/Feeding Details (indicate cue type and reason): assist needed to open packets, remove lids and cut food, given RUE ROM limitations due to applied sling Grooming: Moderate assistance;Bed level            Upper Body Dressing : Maximal assistance;Sitting   Lower Body Dressing: Total assistance   Toilet Transfer: Total assistance;BSC/3in1;Cueing for sequencing Toilet Transfer Details (indicate cue type and reason): Pt required significant assist to perform a squat pivot transfer onto and off bedside commode. Verbal and tactile cue required for general body positioning and sequencing/transfer technique. Toileting- Clothing Manipulation and Hygiene: Total assistance;+2 for physical assistance;Sit to/from stand;Cueing for compensatory techniques;Cueing for sequencing Toileting - Clothing Manipulation Details (indicate cue type and reason): Pt noted to be heavily soiled of bowel in bed. After being assisted to the bedside commode, she required total assist for posterior and anterior peri-hygiene, as well as increased assist for balance, partial stand while as another performed hygiene and assist for clothing management.              Pertinent Vitals/Pain Pain Assessment Pain Assessment: 0-10 Pain Score: 4  Pain Location: R knee, neck Pain Intervention(s): Limited activity within patient's tolerance, Monitored during session, Repositioned     Extremity/Trunk Assessment Upper Extremity Assessment Upper Extremity Assessment: Right hand dominant RUE Deficits / Details: RUE in sling   Lower Extremity Assessment Lower Extremity Assessment: Generalized weakness       Communication Communication Communication: No apparent difficulties   Cognition Arousal: Alert Behavior During Therapy: WFL for tasks assessed/performed Overall Cognitive Status: Within Functional Limits for tasks assessed      General Comments: Pt following conversation and aware of her medical situation. Following basic commands. Oriented to person, place, month, and year.            Home Living Family/patient expects to be discharged to:: Skilled nursing facility Living Arrangements: Spouse/significant other    Type of Home: Other(Comment) (Condo) Home Access: Level entry        Home Equipment: Cane - single point          Prior Functioning/Environment               Mobility Comments: Prior to 07/05/23 admission pt was at home where she was ambulating with a cane; reports she did not leave house much. Pt admitted this time from SNF rehab, where pt reports she was not walking. ADLs Comments: Both patient and husband are on home hospice and have aides who come in to assist with IADLs. Aide also assisted with bathing.         OT Problem List: Decreased strength;Decreased activity tolerance;Decreased range of motion;Impaired balance (sitting and/or standing);Decreased knowledge of use of DME or AE;Decreased knowledge of precautions;Decreased coordination;Pain;Impaired UE functional use      OT Treatment/Interventions: Self-care/ADL training;Therapeutic exercise;Energy conservation;DME and/or AE instruction;Therapeutic activities;Patient/family education;Balance training    OT Goals(Current goals can be found in the care plan section) Acute Rehab OT Goals OT Goal Formulation: With patient Time For Goal Achievement: 08/01/23 Potential to Achieve Goals: Fair ADL Goals Pt Will Perform Grooming: with min assist;sitting Pt Will Perform Upper Body Dressing: with min assist;sitting Pt Will Transfer to Toilet: stand pivot transfer;bedside commode;with min assist Additional ADL Goal #1: The pt will perform bed mobility with min assist in prep for progressive ADL participation.  OT Frequency: Min 1X/week    Co-evaluation PT/OT/SLP Co-Evaluation/Treatment: Yes Reason for Co-Treatment: For patient/therapist safety;To address functional/ADL transfers PT goals  addressed during session: Balance;Mobility/safety with mobility OT goals addressed during session: ADL's and self-care      AM-PAC OT "6 Clicks" Daily Activity     Outcome Measure Help from another person eating meals?: A Little Help  from another person taking care of personal grooming?: A Lot Help from another person toileting, which includes using toliet, bedpan, or urinal?: Total Help from another person bathing (including washing, rinsing, drying)?: A Lot Help from another person to put on and taking off regular upper body clothing?: A Lot Help from another person to put on and taking off regular lower body clothing?: Total 6 Click Score: 11   End of Session Equipment Utilized During Treatment: Gait belt  Activity Tolerance: Other (comment) (Fair tolerance) Patient left: in bed;with call bell/phone within reach;with bed alarm set  OT Visit Diagnosis: Muscle weakness (generalized) (M62.81);Pain;Unsteadiness on feet (R26.81);Other abnormalities of gait and mobility (R26.89) Pain - Right/Left: Right Pain - part of body:  (knee)                Time: 6606-3016 OT Time Calculation (min): 57 min Charges:  OT General Charges $OT Visit: 1 Visit OT Evaluation $OT Eval Moderate Complexity: 1 Mod OT Treatments $Self Care/Home Management : 23-37 mins    Reuben Likes, OTR/L 07/18/2023, 5:59 PM

## 2023-07-19 ENCOUNTER — Inpatient Hospital Stay (HOSPITAL_COMMUNITY): Payer: Medicare Other

## 2023-07-19 ENCOUNTER — Other Ambulatory Visit: Payer: Self-pay

## 2023-07-19 DIAGNOSIS — K922 Gastrointestinal hemorrhage, unspecified: Secondary | ICD-10-CM | POA: Diagnosis not present

## 2023-07-19 DIAGNOSIS — R1032 Left lower quadrant pain: Secondary | ICD-10-CM

## 2023-07-19 DIAGNOSIS — Z8619 Personal history of other infectious and parasitic diseases: Secondary | ICD-10-CM

## 2023-07-19 DIAGNOSIS — K219 Gastro-esophageal reflux disease without esophagitis: Secondary | ICD-10-CM | POA: Diagnosis not present

## 2023-07-19 DIAGNOSIS — Z9889 Other specified postprocedural states: Secondary | ICD-10-CM

## 2023-07-19 DIAGNOSIS — D62 Acute posthemorrhagic anemia: Secondary | ICD-10-CM | POA: Diagnosis not present

## 2023-07-19 DIAGNOSIS — R0902 Hypoxemia: Secondary | ICD-10-CM | POA: Diagnosis not present

## 2023-07-19 LAB — CBC WITH DIFFERENTIAL/PLATELET
Abs Immature Granulocytes: 0.79 10*3/uL — ABNORMAL HIGH (ref 0.00–0.07)
Basophils Absolute: 0.1 10*3/uL (ref 0.0–0.1)
Basophils Relative: 0 %
Eosinophils Absolute: 0 10*3/uL (ref 0.0–0.5)
Eosinophils Relative: 0 %
HCT: 26.5 % — ABNORMAL LOW (ref 36.0–46.0)
Hemoglobin: 9 g/dL — ABNORMAL LOW (ref 12.0–15.0)
Immature Granulocytes: 3 %
Lymphocytes Relative: 5 %
Lymphs Abs: 1.3 10*3/uL (ref 0.7–4.0)
MCH: 26.9 pg (ref 26.0–34.0)
MCHC: 34 g/dL (ref 30.0–36.0)
MCV: 79.3 fL — ABNORMAL LOW (ref 80.0–100.0)
Monocytes Absolute: 1.7 10*3/uL — ABNORMAL HIGH (ref 0.1–1.0)
Monocytes Relative: 6 %
Neutro Abs: 23.4 10*3/uL — ABNORMAL HIGH (ref 1.7–7.7)
Neutrophils Relative %: 86 %
Platelet Morphology: NORMAL
Platelets: 280 10*3/uL (ref 150–400)
RBC: 3.34 MIL/uL — ABNORMAL LOW (ref 3.87–5.11)
RDW: 21.3 % — ABNORMAL HIGH (ref 11.5–15.5)
WBC: 27.3 10*3/uL — ABNORMAL HIGH (ref 4.0–10.5)
nRBC: 0 % (ref 0.0–0.2)

## 2023-07-19 LAB — HEMOGLOBIN AND HEMATOCRIT, BLOOD
HCT: 27.6 % — ABNORMAL LOW (ref 36.0–46.0)
HCT: 23.5 % — ABNORMAL LOW (ref 36.0–46.0)
Hemoglobin: 9.2 g/dL — ABNORMAL LOW (ref 12.0–15.0)
Hemoglobin: 7.5 g/dL — ABNORMAL LOW (ref 12.0–15.0)

## 2023-07-19 LAB — BASIC METABOLIC PANEL
Anion gap: 10 (ref 5–15)
BUN: 51 mg/dL — ABNORMAL HIGH (ref 8–23)
CO2: 24 mmol/L (ref 22–32)
Calcium: 7.7 mg/dL — ABNORMAL LOW (ref 8.9–10.3)
Chloride: 118 mmol/L — ABNORMAL HIGH (ref 98–111)
Creatinine, Ser: 0.77 mg/dL (ref 0.44–1.00)
GFR, Estimated: >60 mL/min (ref >60)
Glucose, Bld: 133 mg/dL — ABNORMAL HIGH (ref 70–99)
Potassium: 4.4 mmol/L (ref 3.5–5.1)
Sodium: 152 mmol/L — ABNORMAL HIGH (ref 135–145)

## 2023-07-19 LAB — OCCULT BLOOD X 1 CARD TO LAB, STOOL: Fecal Occult Bld: POSITIVE — AB

## 2023-07-19 LAB — PREPARE RBC (CROSSMATCH)

## 2023-07-19 LAB — MAGNESIUM: Magnesium: 2.1 mg/dL (ref 1.7–2.4)

## 2023-07-19 MED ORDER — LORAZEPAM 2 MG/ML PO CONC: 1.0000 mg | ORAL | Status: DC | PRN

## 2023-07-19 MED ORDER — HALOPERIDOL 0.5 MG PO TABS: 0.5000 mg | ORAL_TABLET | ORAL | Status: DC | PRN

## 2023-07-19 MED ORDER — MORPHINE SULFATE (PF) 2 MG/ML IV SOLN
1.0000 mg | INTRAVENOUS | Status: DC | PRN
  Administered 2023-07-19: 2 mg via INTRAVENOUS
  Filled 2023-07-19: qty 1

## 2023-07-19 MED ORDER — GERHARDT'S BUTT CREAM
TOPICAL_CREAM | Freq: Two times a day (BID) | CUTANEOUS | Status: DC
  Filled 2023-07-19: qty 60

## 2023-07-19 MED ORDER — MORPHINE SULFATE (PF) 2 MG/ML IV SOLN
1.0000 mg | INTRAVENOUS | Status: DC | PRN
  Administered 2023-07-19 (×2): 1 mg via INTRAVENOUS
  Filled 2023-07-19 (×2): qty 1

## 2023-07-19 MED ORDER — SODIUM CHLORIDE 0.9% IV SOLUTION: Freq: Once | INTRAVENOUS | Status: DC

## 2023-07-19 MED ORDER — DEXTROSE 5 % IV SOLN: INTRAVENOUS | Status: DC

## 2023-07-19 MED ORDER — HALOPERIDOL LACTATE 2 MG/ML PO CONC
0.5000 mg | ORAL | Status: DC | PRN
  Filled 2023-07-19: qty 5

## 2023-07-19 MED ORDER — LORAZEPAM 2 MG/ML IJ SOLN
1.0000 mg | INTRAMUSCULAR | Status: DC | PRN
  Administered 2023-07-19: 1 mg via INTRAVENOUS
  Filled 2023-07-19: qty 1

## 2023-07-19 MED ORDER — SODIUM CHLORIDE 0.9 % IV BOLUS
500.0000 mL | Freq: Once | INTRAVENOUS | Status: AC
  Administered 2023-07-19: 500 mL via INTRAVENOUS

## 2023-07-19 MED ORDER — LORAZEPAM 1 MG PO TABS: 1.0000 mg | ORAL_TABLET | ORAL | Status: DC | PRN

## 2023-07-19 MED ORDER — TECHNETIUM TC 99M-LABELED RED BLOOD CELLS IV KIT
20.0000 | PACK | Freq: Once | INTRAVENOUS | Status: AC
  Administered 2023-07-19: 22 via INTRAVENOUS

## 2023-07-19 MED ORDER — HALOPERIDOL LACTATE 5 MG/ML IJ SOLN: 0.5000 mg | INTRAMUSCULAR | Status: DC | PRN

## 2023-07-19 NOTE — Progress Notes (Signed)
Patient has had six loose maroon stools with clots, Hemoccult was positive for blood. Patient has had 2 doses of PRN Xanax. Patient receiving 2 units of RBC's. She has also complained of new IV to left upper arm. Several attempts were made to make patient comfortable, but was unsuccessful. Order placed for IV to reassess. Spoke with IV nurse no concern to remove IV at this time. Pt will need H/H and a.m. lab work drawn after 2 unit of RBC is completed. Also spoke with daughter on the phone and she stated that her and her dad would liked to be made aware and discussed any invasive procedures.

## 2023-07-19 NOTE — Consult Note (Addendum)
Referring Provider: Dr. Glade Lloyd Primary Care Physician:  Joselyn Arrow, MD Primary Gastroenterologist: Dr. Barron Alvine  Reason for Consultation:  GI bleed   HPI: Tracy Malone is a 89 y.o. female with a past medical history depression, fibromyalgia, early dementia, Parkinson's disease, carotid artery disease, hypothyroidism, GERD, H. Pylori gastritis, diverticulosis, ischemic colitis per CT 10/2018, failure to thrive, recent hospitalization 12/12 - 07/11/2023 secondary to  right humerus fracture and right hip fracture status post percutaneous screw fixation on 07/07/2023 on Xarelto for VTE prophylaxis.   She presented from [facility] via EMS to the [facility] ED 07/16/2023 secondary to increased agitation and decreased urine output.  Family indicated patient did not receive her Fluoxetine and Xanax which contributed to agitation.  Patient endorsed having a cough with dark sputum.  Chest x-ray identified bilateral lower lobe infiltrates consistent with immunity acquired pneumonia.  Labs showed a WBC count of 17.3.  Hemoglobin 11.8 down from 12.2 ten days ago.  Platelets 524.  Sodium 1.4.  Potassium 3.6.  BUN 17.  Creatinine 0.38.  Total bili 1.2.  Alk phos 67.  AST is 21.  ALT 7.  SARS coronavirus 2 negative.  Respiratory pathogen panel negative.  Blood cultures negative on day 3. Patient initially declined antibiotic treatment and informed the ED provider that she wanted to die from this infection.  Her daughter was notified and convince the patient to undergo treatment for pneumonia.  Her hemoglobin level dropped to 9.3 on 12/24 down to 7.9 12/25 with a repeat hemoglobin level 10:30 PM yesterday down to 5.6.  She is transfused 2 units of PRBCs, the second transfusion completed at today 7:58 AM.  Patient started passing maroon-colored blood per the rectum.  A GI consult was requested for further evaluation regarding GI bleed. Nursing staff reported patient started passing maroon  colored blood with clots overnight. Last dose of Xarelto was given on 12/25 at 0938.   Currently, patient is alert and oriented.  She denies having any chest pain or SOB.  She has an intermittent cough on oxygen 6L [place]. She has chronic LLQ pain which is not any worse since admission.  She has occasional heartburn and sometimes has difficulty swallowing thick foods. A swallow study was done due to concerns for possible aspiration pneumonia which showed moderate oropharyngeal dysphagia. Speech pathologist recommended regular texture, nectar thick liquids diet without the use of straws and to crush all meds. On exam, she was found to be sitting up in a large amount of maroon blood with clots. BP 106/56, HR 86. RN at the bedside.   Patient was last seen in our outpatient GI clinic by Dr. Barron Alvine 05/29/2023 due to having nausea, LLQ pain, decreased appetite, weight loss and constipation.  At that time, she was prescribed MiraLAX purge, Benefiber to improve constipation which would hopefully increase p.o. intake.  Patient was also referred to palliative/hospice care.  Her most recent EGD was 11/21/2019 which showed chronic gastritis without evidence of H. pylori.  A colonoscopy was done on the same date which identified diverticulosis in the left colon otherwise was unremarkable.   GI PROCEDURES:  EGD 11/21/2019: - Mild pan-gastritis. Biopsied. - The examination was otherwise normal.  Surgical [P], gastric antrum and gastric body - MILD CHRONIC GASTRITIS WITH REACTIVE CHANGES. - WARTHIN-STARRY IS NEGATIVE FOR HELICOBACTER PYLORI. - NO INTESTINAL METAPLASIA, DYSPLASIA, OR MALIGNANCY  Colonoscopy 11/21/2019: - Diverticulosis in the left colon. - The examination was otherwise normal on direct and retroflexion views. -  No specimens collected.  EGD 09/12/2016: - Normal esophagus. - Mild, non-specific gastritis. Biopsied.  - Normal examined duodenum. - CHRONIC MILDLY ACTIVE GASTRITIS WITH RARE  HELICOBACTER PYLORI. - WARTHIN-STARRY STAIN POSITIVE FOR HELICOBACTER PYLORI. - NO INTESTINAL METAPLASIA, DYSPLASIA OR MALIGNANCY.  EGD 12/09/2014: There was mild to moderate non-specific pan-gastritis. This was biopsied and sent to pathology. There was a small (1cm) hiatal hernia. The examination was otherwise normal  CHRONIC MILDLY ACTIVE GASTRITIS WITH HELICOBACTER PYLORI. - WARTHIN-STARRY STAIN POSITIVE FOR HELICOBACTER PYLORI. - NO DYSPLASIA OR MALIGNANCY.  Sigmoidoscopy 06/27/2012: Medium hemorrhoids Path report unremarkable colonic mucosa  EGD 06/27/2012: Moderate gastritis, path report positive for H. pylori  Past Medical History:  Diagnosis Date   Acute lower UTI 11/22/2018   Bell's palsy 1966   Hx: right side facial droop, resolved per patient 04/02/19   Carotid artery disease (HCC) 2010   on vascular screening;unchanged 2013.(could not tolerate simvastatin, no other statins tried)--<30% blockage bilat 07/2011   Claustrophobia    Depression    treated in the past for years;stopped in 2010 for a years   Duodenal ulcer 1962   h/o   Dysrhythmia    ocassional PVC's, no current problems per patient on 04/02/19   Fibromyalgia    chronic fatigue, chronic abdominal pain   Frequent PVCs 07/2012   Seen by [facility]: benign, asymptomatic, normal EF   Gallstones 02/2021   on [place]   GERD (gastroesophageal reflux disease)    diet controlled   Glaucoma, narrow-angle    s/p laser surgery   History of hiatal hernia    during endoscopy   Hypothyroid 03/2007   Hypoxia 07/16/2023   IBS (irritable bowel syndrome)    Dr. Elnoria Howard   Ischemic colitis (HCC) 11/21/2018   no current problems per patient on 04/02/19   Lichenoid keratosis 12/03/2020   Dr.Stinehelfer   Ocular migraine    Osteoporosis 04/2010   Dr.Hawkes; later consulted Dr. Sharl Ma (2022)   Panic attack    Parkinson's disease (HCC) 06/23/2016   Recurrent UTI    has cystocele-Dr.Grewal   Shingles 1999   h/o   Superficial  thrombophlebitis 03/2009   RLE   Trochanteric bursitis 12/2008   bilateral    Past Surgical History:  Procedure Laterality Date   ABDOMINAL HYSTERECTOMY     BACK SURGERY  05/2019   CATARACT EXTRACTION, BILATERAL  1995, 1996   EYE SURGERY Bilateral    laser - glaucoma   Flexible sigmoidoscopy     FRACTURE SURGERY  2020, Sept.  12th (!)   HIP PINNING,CANNULATED Right 07/07/2023   Procedure: PERCUTANEOUS FIXATION OF FEMORAL NECK;  Surgeon: London Sheer, MD;  Location: [facility] OR;  Service: Orthopedics;  Laterality: Right;   KYPHOPLASTY N/A 04/03/2019   Procedure: KYPHOPLASTY L1;  Surgeon: Venita Lick, MD;  Location: [facility] OR;  Service: Orthopedics;  Laterality: N/A;  90 mins   RADIOFREQUENCY ABLATION Bilateral 11/14/2022   L3-4, L4-5 bilateral ([facility])   THYROIDECTOMY, PARTIAL  09/2005   L nodule; Dr. Gerrit Friends   TONSILLECTOMY  1946   UPPER GI ENDOSCOPY  06/27/2012   VAGINAL HYSTERECTOMY  1971   and bladder repair.  Still has ovaries   WISDOM TOOTH EXTRACTION      Prior to Admission medications   Medication Sig Start Date End Date Taking? Authorizing Provider  acetaminophen (TYLENOL) 325 MG tablet Take 2 tablets (650 mg total) by mouth every 6 (six) hours as needed for mild pain (pain score 1-3). 07/11/23  Yes Ghimire,  Werner Lean, MD  ALPRAZolam Prudy Feeler) 0.5 MG tablet TAKE FOUR TABLETS BY MOUTH DAILY AS NEEDED FOR ANXIETY Patient taking differently: Take 0.5 mg by mouth every 6 (six) hours as needed for anxiety. TAKE FOUR TABLETS BY MOUTH DAILY AS NEEDED FOR ANXIETY 07/11/23  Yes Ghimire, Werner Lean, MD  b complex vitamins capsule Take 1 capsule by mouth daily.   Yes [provider]  Carbidopa-Levodopa ER (SINEMET CR) 25-100 MG tablet controlled release TAKE ONE TABLET BY MOUTH THREE TIMES A DAY 06/28/23  Yes Sater, Pearletha Furl, MD  Cholecalciferol (VITAMIN D-3 PO) Take 1,000 Units by mouth 3 (three) times a week. Monday Wednesday Friday   Yes [provider]   dicyclomine (BENTYL) 10 MG capsule Take 1 capsule (10 mg total) by mouth every 6 (six) hours as needed for spasms. 05/29/23  Yes Cirigliano, Vito V, DO  feeding supplement (ENSURE ENLIVE / ENSURE PLUS) LIQD Take 237 mLs by mouth 2 (two) times daily between meals. 07/11/23  Yes Ghimire, Werner Lean, MD  FLUoxetine (PROZAC) 10 MG capsule Take 1 capsule (10 mg total) by mouth daily. 05/10/23  Yes Mozingo, Thereasa Solo, NP  levothyroxine (SYNTHROID) 25 MCG tablet Take 25 mcg by mouth daily before breakfast.   Yes [provider]  methocarbamol (ROBAXIN) 500 MG tablet Take 1 tablet (500 mg total) by mouth every 6 (six) hours as needed for muscle spasms. 07/11/23  Yes Ghimire, Werner Lean, MD  oxyCODONE (OXY IR/ROXICODONE) 5 MG immediate release tablet Take 1 tablet (5 mg total) by mouth every 6 (six) hours as needed for moderate pain (pain score 4-6). 07/11/23  Yes Ghimire, Werner Lean, MD  polyethylene glycol (MIRALAX) 17 g packet Take 17 g by mouth daily as needed. 04/20/22  Yes Gareth Eagle, PA-C  Probiotic Product (PROBIOTIC PO) Take 1 capsule by mouth 2 (two) times a week. Monday Thursday   Yes [provider]  rivaroxaban (XARELTO) 10 MG TABS tablet Take 1 tablet (10 mg total) by mouth daily for 21 days. 07/11/23 08/01/23 Yes Ghimire, Werner Lean, MD    Current Facility-Administered Medications  Medication Dose Route Frequency Provider Last Rate Last Admin   ALPRAZolam Prudy Feeler) tablet 0.5 mg  0.5 mg Oral Q6H PRN Nolberto Hanlon, MD   0.5 mg at  0230   Ampicillin-Sulbactam (UNASYN) 3 g in sodium chloride 0.9 % 100 mL IVPB  3 g Intravenous Q12H Alekh, Kshitiz, MD 200 mL/hr at  0102 3 g at  0102   calcium carbonate (TUMS - dosed in mg elemental calcium) chewable tablet 200 mg of elemental calcium  1 tablet Oral BID Nolberto Hanlon, MD   200 mg of elemental calcium at 07/18/23 0938   carbidopa-levodopa (SINEMET IR) 25-100 MG per tablet immediate release 1 tablet  1 tablet  Oral TID AC Wofford, Deirdre Evener, RPH   1 tablet at 07/18/23 1711   feeding supplement (ENSURE ENLIVE / ENSURE PLUS) liquid 237 mL  237 mL Oral BID BM Alekh, Kshitiz, MD   237 mL at 07/18/23 1422   food thickener (SIMPLYTHICK (NECTAR/LEVEL 2/MILDLY THICK)) 1 packet  1 packet Oral PRN Nolberto Hanlon, MD   1 packet at 07/16/23 1943   ipratropium-albuterol (DUONEB) 0.5-2.5 (3) MG/3ML nebulizer solution 3 mL  3 mL Nebulization Q4H PRN Anthoney Harada, NP       levothyroxine (SYNTHROID) tablet 25 mcg  25 mcg Oral Q0600 Nolberto Hanlon, MD   25 mcg at 07/18/23 0518   multivitamin with minerals tablet 1 tablet  1 tablet Oral Daily Glade Lloyd, MD   1 tablet at 07/18/23 1610   oxyCODONE (Oxy IR/ROXICODONE) immediate release tablet 5 mg  5 mg Oral Q4H PRN Nolberto Hanlon, MD   5 mg at 07/18/23 1711   pantoprazole (PROTONIX) injection 40 mg  40 mg Intravenous Q12H Anthoney Harada, NP   40 mg at  0100   polyethylene glycol (MIRALAX / GLYCOLAX) packet 17 g  17 g Oral Daily PRN Nolberto Hanlon, MD       senna-docusate (Senokot-S) tablet 2 tablet  2 tablet Oral BID Nolberto Hanlon, MD   2 tablet at 07/18/23 9604   sodium chloride flush (NS) 0.9 % injection 3 mL  3 mL Intravenous Conchita Paris, MD   3 mL at 07/18/23 5409    Allergies as of 07/16/2023 - Review Complete 07/16/2023  Allergen Reaction Noted   Iodinated contrast media Anaphylaxis 08/27/2020   Iodine Anaphylaxis 05/13/2012   Levsin [hyoscyamine sulfate]  10/13/2015   Salmon [fish allergy] Hives and Shortness Of Breath 05/13/2012   Shellfish allergy Anaphylaxis 05/13/2012   Tramadol Swelling 04/11/2019   Remeron [mirtazapine] Other (See Comments) 08/25/2016   Aspirin Other (See Comments) 05/13/2012   Bis subcit-metronid-tetracyc Swelling 08/01/2012   Cephalexin Hives 05/13/2012   Ciprofloxacin Diarrhea 05/13/2012   Codeine Nausea And Vomiting 05/13/2012   Cyclobenzaprine Other (See Comments) 05/13/2012   Darvocet [propoxyphene n-acetaminophen] Nausea  And Vomiting 05/13/2012   Demerol [meperidine] Nausea Only 05/13/2012   Dexlansoprazole Swelling 03/01/2016   Diphedryl [diphenhydramine] Other (See Comments) 05/13/2012   Doxycycline hyclate Other (See Comments) 05/13/2012   Doxycycline hyclate  07/30/2020   Epinephrine Other (See Comments) 05/13/2012   Erythromycin Other (See Comments) 05/13/2012   Fish oil  07/30/2020   Hydrocodone Swelling 03/12/2019   Hyoscyamine  07/30/2020   Latex Other (See Comments) 05/13/2012   Nitrofurantoin Diarrhea 11/22/2018   Nsaids Other (See Comments) 07/06/2023   Other  02/20/2019   Prednisone Other (See Comments) 05/13/2012   Ra diphedryl allergy [diphenhydramine hcl]  05/13/2012   Sertraline hcl Swelling and Other (See Comments) 05/13/2012   Shellfish-derived products  07/30/2020   Sulfa antibiotics Other (See Comments) 05/13/2012   Wellbutrin [bupropion] Other (See Comments) 07/29/2020   Xylocaine [lidocaine hcl]  05/13/2012   Xylocaine [lidocaine]  07/30/2020   Biaxin [clarithromycin] Rash 01/14/2015   Fluoxetine Other (See Comments) 07/16/2023   Ibuprofen Other (See Comments) 05/13/2012   Meperidine hcl Nausea And Vomiting 02/20/2019   Penicillins Hives and Rash 05/13/2012    Family History  Problem Relation Age of Onset   Heart disease Mother    Hypertension Mother    Hypertension Sister    Arthritis Sister    Heart disease Brother    Lung cancer Brother        lung   Diabetes Maternal Grandfather    HIV Son    Diabetes Granddaughter        type 1   Colon cancer Neg Hx    Esophageal cancer Neg Hx    Prostate cancer Neg Hx    Stomach cancer Neg Hx    Rectal cancer Neg Hx     Social History   Socioeconomic History   Marital status: Married    Spouse name: Chrissie Noa   Number of children: 2   Years of education: Not on file   Highest education level: Not on file  Occupational History   Occupation: retired (school system)  Tobacco Use   Smoking status: Never   Smokeless  tobacco: Never  Vaping Use   Vaping status: Never Used  Substance and Sexual Activity   Alcohol use: No    Alcohol/week: 0.0 standard drinks of alcohol   Drug use: No   Sexual activity: Not Currently    Partners: Male    Comment: Hysterectomy  Other Topics Concern   Not on file  Social History Narrative   Married.  Son lives in [place]; Daughter [name] lives in [place]; 2 grandchildren, 1 great grandson (in [place])      Updated 10/2022   Social Drivers of Health   Financial Resource Strain: Low Risk  (05/05/2021)   Overall Financial Resource Strain (CARDIA)    Difficulty of Paying Living Expenses: Not hard at all  Food Insecurity: No Food Insecurity (07/16/2023)   Hunger Vital Sign    Worried About Running Out of Food in the Last Year: Never true    Ran Out of Food in the Last Year: Never true  Transportation Needs: No Transportation Needs (07/16/2023)   PRAPARE - Administrator, Civil Service (Medical): No    Lack of Transportation (Non-Medical): No  Physical Activity: Not on file  Stress: Not on file  Social Connections: Unknown (12/06/2021)   [facility]   Social Network    Social Network: Not on file  Intimate Partner Violence: Not At Risk (07/16/2023)   Humiliation, Afraid, Rape, and Kick questionnaire    Fear of Current or Ex-Partner: No    Emotionally Abused: No    Physically Abused: No    Sexually Abused: No    Review of Systems: Gen: + Weight loss. CV: Denies chest pain, palpitations or edema. Resp: Denies cough, shortness of breath of hemoptysis.  GI: See HPI. GU : Denies urinary burning, blood in urine, increased urinary frequency or incontinence. MS: + Arthritis, right hip and right arm pain. Derm: Denies rash, itchiness, skin lesions or unhealing ulcers. Psych: Denies depression, anxiety, memory loss or confusion. Heme: Denies easy bruising, bleeding. Neuro:  Denies headaches, dizziness or paresthesias. Endo:   Denies any problems with DM, thyroid or adrenal function.  Physical Exam: Vital signs in last 24 hours: Temp:  [97.3 F (36.3 C)-98.6 F (37 C)] 97.3 F (36.3 C) (12/26 0748) Pulse Rate:  [78-103] 83 (12/26 0748) Resp:  [15-28] 16 (12/26 0748) BP: (88-141)/(35-97) 108/53 (12/26 0748) SpO2:  [94 %-100 %] 100 % (12/26 0748) Last BM Date :  General: Chronically ill frail appearing 89 year old female. Head:  Normocephalic and atraumatic. Eyes:  No scleral icterus. Conjunctiva pink. Ears:  Normal auditory acuity. Nose:  No deformity, discharge or lesions. Mouth:  Dentition intact. No ulcers or lesions. Dry oral mucosa.  Neck:  Supple. No lymphadenopathy or thyromegaly.  Lungs: Breath sounds decreased throughout. On oxygen 6L [place]. Heart:  RRR, no murmurs.  Abdomen: Soft, nondistended. Nontender. + BS x 4 quads. No palpable mass. No bruit. Positive bowel sounds x 4 quads.  Rectal: Deferred. Bed pad saturated with a large amount of maroon colored blood with dark clots. No bright red blood.  Musculoskeletal: RUQ in sling. Pulses:  Normal pulses noted. Extremities: No edema. Neurologic:  Alert and  oriented x 4 at this time. Answers all questions appropriately. Speech is softly spoken. Moves all extremities weakly.  Skin:  Intact without significant lesions or rashes. Psych:  Alert and cooperative. Normal mood and affect.  Intake/Output from previous day: 12/25 0701 - 12/26 0700 In: 518.9 [P.O.:20; Blood:298.9; IV Piggyback:200] Out:  6 [Stool:6] Intake/Output this shift: Total I/O In: 429.1 [Blood:429.1] Out: -   Lab Results: Recent Labs    07/16/23 1156 07/17/23 0655 07/18/23 0359 07/18/23 2234  WBC 17.3* 22.8* 23.4*  --   HGB 11.8* 9.3* 7.9* 5.6*  HCT 37.5 30.2* 25.2* 18.0*  PLT 524* 419* 468*  --    BMET Recent Labs    07/16/23 1156 07/17/23 0027 07/18/23 0359  NA 144 143 142  K 3.6 4.1 3.6  CL 107 110 107  CO2 28 24 26   GLUCOSE 126* 117* 145*  BUN 17 22  26*  CREATININE 0.38* 0.48 0.51  CALCIUM 8.6* 8.1* 8.5*   LFT Recent Labs    07/18/23 0359  PROT 5.3*  ALBUMIN 2.4*  AST 14*  ALT 13  ALKPHOS 60  BILITOT 1.0   PT/INR Recent Labs    07/17/23 0028  LABPROT 15.3*  INR 1.2   Hepatitis Panel No results for input(s): "HEPBSAG", "HCVAB", "HEPAIGM", "HEPBIGM" in the last 72 hours.    Studies/Results: VAS [place] LOWER EXTREMITY VENOUS (DVT) Result Date: 07/17/2023  Lower Venous DVT Study Patient Name:  Tracy Malone  Date of Exam:   07/17/2023 Medical Rec #: [id number]        Accession #:    [id number] Date of Birth: 20-Nov-1931         Patient Gender: F Patient Age:   30 years Exam Location:  [facility] Procedure:      VAS [place] LOWER EXTREMITY VENOUS (DVT) Referring Phys: [facility] GOEL --------------------------------------------------------------------------------  Indications: Swelling.  Risk Factors: Trauma. Limitations: Poor ultrasound/tissue interface and patient positioning, patient immobility, patient pain tolerance. Comparison Study: No prior studies. Performing Technologist: Chanda Busing RVT  Examination Guidelines: A complete evaluation includes B-mode imaging, spectral Doppler, color Doppler, and power Doppler as needed of all accessible portions of each vessel. Bilateral testing is considered an integral part of a complete examination. Limited examinations for reoccurring indications may be performed as noted. The reflux portion of the exam is performed with the patient in reverse Trendelenburg.  +---------+---------------+---------+-----------+----------+--------------+ RIGHT    CompressibilityPhasicitySpontaneityPropertiesThrombus Aging +---------+---------------+---------+-----------+----------+--------------+ CFV      Full           Yes      Yes                                 +---------+---------------+---------+-----------+----------+--------------+ SFJ      Full                                                         +---------+---------------+---------+-----------+----------+--------------+ FV Prox  Full                                                        +---------+---------------+---------+-----------+----------+--------------+ FV Mid   Full                                                        +---------+---------------+---------+-----------+----------+--------------+  FV DistalFull                                                        +---------+---------------+---------+-----------+----------+--------------+ PFV      Full                                                        +---------+---------------+---------+-----------+----------+--------------+ POP      Full           Yes      Yes                                 +---------+---------------+---------+-----------+----------+--------------+ PTV      Full                                                        +---------+---------------+---------+-----------+----------+--------------+ PERO     Full                                                        +---------+---------------+---------+-----------+----------+--------------+   +---------+---------------+---------+-----------+----------+--------------+ LEFT     CompressibilityPhasicitySpontaneityPropertiesThrombus Aging +---------+---------------+---------+-----------+----------+--------------+ CFV      Full           Yes      Yes                                 +---------+---------------+---------+-----------+----------+--------------+ SFJ      Full                                                        +---------+---------------+---------+-----------+----------+--------------+ FV Prox  Full                                                        +---------+---------------+---------+-----------+----------+--------------+ FV Mid   Full                                                         +---------+---------------+---------+-----------+----------+--------------+ FV DistalFull                                                        +---------+---------------+---------+-----------+----------+--------------+  PFV      Full                                                        +---------+---------------+---------+-----------+----------+--------------+ POP      Full           Yes      Yes                                 +---------+---------------+---------+-----------+----------+--------------+ PTV      Full                                                        +---------+---------------+---------+-----------+----------+--------------+ PERO     Full                                                        +---------+---------------+---------+-----------+----------+--------------+     Summary: RIGHT: - There is no evidence of deep vein thrombosis in the lower extremity.  - No cystic structure found in the popliteal fossa.  LEFT: - There is no evidence of deep vein thrombosis in the lower extremity.  - No cystic structure found in the popliteal fossa.  *See table(s) above for measurements and observations. Electronically signed by Gerarda Fraction on 07/17/2023 at 11:46:13 AM.    Final     IMPRESSION/PLAN:  89 year old female admitted to the hospital with bilateral pneumonia with concern for aspiration pneumonia. On oxygen 6L [place]. On Unasyn IV.  -Management per the hospitalist  Acute GI bleed and anemia. Hg 11.8 -> 9.3 -> 7.9 -> 5.6 with maroon blood with dark clots -> transfused 2 units of PRBCs overnight, 2nd transfusion completed at 8am.  Patient found with bed pad soiled with a large amount of maroon blood with clots at 8am.  BP 106/56, HR 86. Last dose of Xarelto was 09:38AM on 12/25. Chronic LLQ pain. Suspect diverticular bleed.  -NPO -IV fluids and pain management per the hospitalist  -Await post transfusion H/H  -Transfuse for Hg < 8 or as needed if  symptomatic  -Stat tagged RBC scan (CTA deferred as patient reported having allergic reaction to IV contrast: hypotension/dizziness -Hospitalist Dr. Hanley Ben updated regarding persistent GI bleeding/tagged RBC scan ordered -I called the patient's daughter and provided update regarding GI bleeding and tagged RBC scan.   Acute on chronic anemia -See plan above   GERD, remote history of H. Pylori.  EGD 10/2019 showed chronic gastritis without evidence of H. pylori. -Continue PPI IV bid   Recent right humerus fracture and right hip fracture status post percutaneous screw fixation on 07/07/2023 on Xarelto for VTE prophylaxis.  -Continue to hold Xarelto  -Palliative care consult, patient previously under Hospice care at home   Anxiety   Parkinson's disease    Arnaldo Natal  , 9:50AM   Attending Physician Note   I have taken a history, reviewed the chart and examined the patient. I  performed more than 50% of this encounter in conjunction with the APP. I agree with the APP's note, impression and recommendations with my edits. My additional impressions and recommendations are as follows.   Patient profile: 89 year old female with Parkinson's disease, suspected dementia, recent R hip and R humerus fractures on hospice for FTT admitted to the hospital with bilateral pneumonia with concern for aspiration pneumonia. On oxygen 6L . On Unasyn IV.   Acute GI bleed - maroon stool, elevated BUN on Xarelto, LGI vs UGI. R/O diverticular, AVMs, ulcer, etc. Hold Xarelto. Tagged RBC scan instead of CTA given IV contrast reaction / allergy. IV PPI bid. If colonic or SB source is not identified consider EGD after Xarelto wash out. Palliative care input.   Chronic anemia with ABL anemia. Trend CBC. Transfuse for Hgb < 8  GERD, remote history of H pylori   Claudette Head, MD [facility] See [facility], [facility], for our on call provider

## 2023-07-19 NOTE — Progress Notes (Signed)
SLP Cancellation Note  Patient Details Name: Tracy Malone MRN: [id number] DOB: 1932-06-26   Cancelled treatment:       Reason Eval/Treat Not Completed: Other (comment);Medical issues which prohibited therapy (pt now NPO and for potentially for GI referral per chart, will continue efforts)  Rolena Infante, MS [facility] SLP Acute Rehab Services Office [phone]  Chales Abrahams , 4:47 AM

## 2023-07-19 NOTE — Progress Notes (Signed)
At bedside during patient's death. Patient TOD confirmed by 2 RN verification from Beverly Gust, RN, and Ardeen Fillers, RN. Patient TOD 2200. [facility] notified, patient is not a candidate for donation d/t age.   2340: Transport notified that patient is ready for transport to the morgue.

## 2023-07-19 NOTE — Progress Notes (Signed)
Called patient's daughter to obtain consent for PICC placement. Daughter expresses concern over procedure, and wants to speak with family before making a decision on placement of PICC. Daughter also mentions a possible withdrawal of further interventions as to ,"not put her through further pain." Family will make a decision and revisit at a later time (tonight or tomorrow). Primary RN aware and will alert attending MD.Pt currently has a  functioning PIV and will contact IVT if needed.

## 2023-07-19 NOTE — Plan of Care (Signed)
Patient expired at 2200

## 2023-07-19 NOTE — Progress Notes (Signed)
PROGRESS NOTE    Tracy Malone  ZOX:[id number] DOB: Feb 05, 1932 DOA: 07/16/2023 PCP: Joselyn Arrow, MD   Brief Narrative:   89 y.o. female with medical history significant of Parkinson's disease, suspected dementia, on hospice for failure to thrive prior to recent hospitalization from 07/05/2023 - 07/11/2023 for right hip fracture and right humerus fracture requiring percutaneous screw fixation of right hip fracture on 07/07/2023 with subsequent discharge to SNF.  She presented with increasing lethargy/confusion/agitation and low blood pressure.  On presentation, she was found to be hypoxic and chest x-ray showed possible pneumonia.  She was started on IV antibiotics.  Assessment & Plan:   Possible bilateral pneumonia with concern for aspiration pneumonia Acute respiratory failure with hypoxia -Respiratory status has worsened.  Currently on 6 L oxygen by nasal cannula.  Check chest x-ray.  Respiratory panel PCR negative.  Continue IV Unasyn.  Wean off oxygen as able. -Patient has been made n.p.o. for this morning  Possible lower GI bleeding -Patient having maroon stools with clots overnight of 07/18/2023.  Hemoglobin dropped to 5.6.  Being transfused 2 units packed red cells.  Monitor H&H.  Follow GI recommendations.  Xarelto has been held.  Continue Protonix IV every 12 hours for now.  Leukocytosis -Worsening to 23.4 on 07/18/2023.  Monitor.  Labs pending today  Anemia of chronic disease -From chronic illnesses.  Hemoglobin stable.  Monitor intermittently  Thrombocytosis -Possibly reactive.  Monitor intermittently  Recent closed fracture of of neck of right femur -Status post nailing on 07/07/2023 and subsequent discharge to SNF on oral Xarelto.  Continue with the same.  PT recommended SNF placement again.  Consult TOC.  Parkinson disease Possible Parkinson's dementia Tremors -Continue carbidopa levodopa.  Outpatient follow-up with neurology.  Fall  precautions.  Hypothyroidism -Continue levothyroxine  Osteopenia Recent right humerus fracture -Humerus fracture was treated conservatively.  Continue with sling for the right upper extremity.  Outpatient follow-up with orthopedics.  Continue calcium carbonate  Anxiety -Continue Xanax as needed.  Goals of care - palliative care following.  Patient apparently was on hospice prior to her recent surgical intervention for failure to thrive.   DVT prophylaxis: Xarelto held Code Status: DNR Family Communication: None at bedside Disposition Plan: Status is: Inpatient Remains inpatient appropriate because: Of severity of illness  Consultants: palliative care/GI  Procedures: None  Antimicrobials:  Anti-infectives (From admission, onward)    Start     Dose/Rate Route Frequency Ordered Stop   07/18/23 1400  levofloxacin (LEVAQUIN) IVPB 750 mg  Status:  Discontinued        750 mg 100 mL/hr over 90 Minutes Intravenous Every 48 hours 07/16/23 1901 07/17/23 0940   07/17/23 2200  vancomycin (VANCOREADY) IVPB 750 mg/150 mL  Status:  Discontinued        750 mg 150 mL/hr over 60 Minutes Intravenous Every 48 hours 07/16/23 1933 07/17/23 0940   07/17/23 1400  Ampicillin-Sulbactam (UNASYN) 3 g in sodium chloride 0.9 % 100 mL IVPB        3 g 200 mL/hr over 30 Minutes Intravenous Every 12 hours 07/17/23 0940     07/16/23 2000  vancomycin (VANCOREADY) IVPB 750 mg/150 mL        750 mg 150 mL/hr over 60 Minutes Intravenous  Once 07/16/23 1933 07/16/23 2131   07/16/23 1645  levofloxacin (LEVAQUIN) IVPB 750 mg        750 mg 100 mL/hr over 90 Minutes Intravenous  Once 07/16/23 1630 07/16/23 1844   07/16/23 1645  metroNIDAZOLE (  FLAGYL) IVPB 500 mg  Status:  Discontinued        500 mg 100 mL/hr over 60 Minutes Intravenous Every 12 hours 07/16/23 1630 07/17/23 0940        Subjective: Patient seen and examined at bedside.  Nursing staff reports loose maroon stools with clots overnight.  No  agitation, fever or vomiting reported.   Objective: Vitals:    0304  0423  0430  0455  BP: (!) 106/50 (!) 128/59 (!) 128/59 (!) 141/48  Pulse: 88 84 84 93  Resp: 20 (!) 28 (!) 28 (!) 24  Temp: 98 F (36.7 C) 98.6 F (37 C) 98.6 F (37 C) 98.6 F (37 C)  TempSrc: Oral Axillary Axillary Axillary  SpO2: 100% 100% 100% 100%  Weight:      Height:        Intake/Output Summary (Last 24 hours) at  0734 Last data filed at  0513 Gross per 24 hour  Intake 518.93 ml  Output 6 ml  Net 512.93 ml   Filed Weights   07/16/23 1131  Weight: 38.1 kg    Examination:  General: No acute distress.  Currently on 6 L oxygen via nasal cannula.  Chronically ill and deconditioned looking.  Elderly female lying in bed. ENT/neck: No obvious neck masses or JVD elevation noted respiratory: Bilateral decreased breath sounds at the bases with scattered crackles and intermittent tachypnea  CVS: Rate mostly controlled; S1 and S2 are heard Abdominal: Soft, nontender, remains distended; no organomegaly, normal bowel sounds heard  extremities: No clubbing; mild lower extremity edema present  CNS: Extremely slow to respond.  Confused.  No obvious focal deficits noted lymph: No obvious palpable lymphadenopathy Skin: No obvious petechiae/rashes psych: Very flat affect.  Not agitated.   Musculoskeletal: Right arm is in a sling    Data Reviewed: I have personally reviewed following labs and imaging studies  CBC: Recent Labs  Lab 07/16/23 1156 07/17/23 0655 07/18/23 0359 07/18/23 2234  WBC 17.3* 22.8* 23.4*  --   NEUTROABS  --   --  20.8*  --   HGB 11.8* 9.3* 7.9* 5.6*  HCT 37.5 30.2* 25.2* 18.0*  MCV 95.9 99.3 97.3  --   PLT 524* 419* 468*  --    Basic Metabolic Panel: Recent Labs  Lab 07/16/23 1156 07/17/23 0027 07/18/23 0359  NA 144 143 142  K 3.6 4.1 3.6  CL 107 110 107  CO2 28 24 26   GLUCOSE 126* 117* 145*  BUN 17 22 26*  CREATININE  0.38* 0.48 0.51  CALCIUM 8.6* 8.1* 8.5*  MG  --   --  2.1   GFR: Estimated Creatinine Clearance: 27.3 mL/min (by C-G formula based on SCr of 0.51 mg/dL). Liver Function Tests: Recent Labs  Lab 07/16/23 1156 07/18/23 0359  AST 21 14*  ALT 7 13  ALKPHOS 67 60  BILITOT 1.2* 1.0  PROT 6.2* 5.3*  ALBUMIN 3.0* 2.4*   No results for input(s): "LIPASE", "AMYLASE" in the last 168 hours. No results for input(s): "AMMONIA" in the last 168 hours. Coagulation Profile: Recent Labs  Lab 07/17/23 0028  INR 1.2   Cardiac Enzymes: No results for input(s): "CKTOTAL", "CKMB", "CKMBINDEX", "TROPONINI" in the last 168 hours. BNP (last 3 results) No results for input(s): "PROBNP" in the last 8760 hours. HbA1C: No results for input(s): "HGBA1C" in the last 72 hours. CBG: Recent Labs  Lab 07/16/23 1215  GLUCAP 111*   Lipid Profile: No results for input(s): "CHOL", "HDL", "LDLCALC", "  TRIG", "CHOLHDL", "LDLDIRECT" in the last 72 hours. Thyroid Function Tests: No results for input(s): "TSH", "T4TOTAL", "FREET4", "T3FREE", "THYROIDAB" in the last 72 hours. Anemia Panel: No results for input(s): "VITAMINB12", "FOLATE", "FERRITIN", "TIBC", "IRON", "RETICCTPCT" in the last 72 hours. Sepsis Labs: No results for input(s): "PROCALCITON", "LATICACIDVEN" in the last 168 hours.  Recent Results (from the past 240 hours)  SARS Coronavirus 2 by RT PCR (hospital order, performed in [facility] hospital lab) *cepheid single result test* Anterior Nasal Swab     Status: None   Collection Time: 07/16/23  9:59 PM   Specimen: Anterior Nasal Swab  Result Value Ref Range Status   SARS Coronavirus 2 by RT PCR NEGATIVE NEGATIVE Final    Comment: Performed at [facility], [address], [place], [place] [address]  Respiratory (~20 pathogens) panel by PCR     Status: None   Collection Time: 07/16/23  9:59 PM   Specimen: Nasopharyngeal Swab; Respiratory  Result Value Ref Range Status   Adenovirus NOT  DETECTED NOT DETECTED Final   Coronavirus 229E NOT DETECTED NOT DETECTED Final    Comment: (NOTE) The Coronavirus on the Respiratory Panel, DOES NOT test for the novel  Coronavirus (2019 nCoV)    Coronavirus HKU1 NOT DETECTED NOT DETECTED Final   Coronavirus NL63 NOT DETECTED NOT DETECTED Final   Coronavirus OC43 NOT DETECTED NOT DETECTED Final   Metapneumovirus NOT DETECTED NOT DETECTED Final   Rhinovirus / Enterovirus NOT DETECTED NOT DETECTED Final   Influenza A NOT DETECTED NOT DETECTED Final   Influenza B NOT DETECTED NOT DETECTED Final   Parainfluenza Virus 1 NOT DETECTED NOT DETECTED Final   Parainfluenza Virus 2 NOT DETECTED NOT DETECTED Final   Parainfluenza Virus 3 NOT DETECTED NOT DETECTED Final   Parainfluenza Virus 4 NOT DETECTED NOT DETECTED Final   Respiratory Syncytial Virus NOT DETECTED NOT DETECTED Final   Bordetella pertussis NOT DETECTED NOT DETECTED Final   Bordetella Parapertussis NOT DETECTED NOT DETECTED Final   Chlamydophila pneumoniae NOT DETECTED NOT DETECTED Final   Mycoplasma pneumoniae NOT DETECTED NOT DETECTED Final    Comment: Performed at [facility], [address], [place], [place] [address]  Culture, blood (Routine X 2) w Reflex to ID Panel     Status: None (Preliminary result)   Collection Time: 07/16/23 10:12 PM   Specimen: BLOOD  Result Value Ref Range Status   Specimen Description   Final    BLOOD LEFT ANTECUBITAL Performed at [facility], [address] [place], [place] [address]    Special Requests   Final    BOTTLES DRAWN AEROBIC AND ANAEROBIC Blood Culture results may not be optimal due to an inadequate volume of blood received in culture bottles Performed at [facility], [address], [place], [place] [address]    Culture   Final    NO GROWTH 3 DAYS Performed at [facility], [address] [place], [place] [address]    Report Status PENDING  Incomplete  Culture, blood (Routine X 2) w  Reflex to ID Panel     Status: None (Preliminary result)   Collection Time: 07/17/23 12:28 AM   Specimen: BLOOD LEFT ARM  Result Value Ref Range Status   Specimen Description   Final    BLOOD LEFT ARM Performed at [facility], [address] [place], [place] [address]    Special Requests   Final    BOTTLES DRAWN  AEROBIC ONLY Blood Culture results may not be optimal due to an inadequate volume of blood received in culture bottles Performed at [facility], [address] [place], [place] [address]    Culture   Final    NO GROWTH 2 DAYS Performed at [facility], [address] [place], [place] [address]    Report Status PENDING  Incomplete  MRSA Next Gen by PCR, Nasal     Status: None   Collection Time: 07/17/23 12:35 AM   Specimen: Nasal Mucosa; Nasal Swab  Result Value Ref Range Status   MRSA by PCR Next Gen NOT DETECTED NOT DETECTED Final    Comment: (NOTE) The GeneXpert MRSA Assay (FDA approved for NASAL specimens only), is one component of a comprehensive MRSA colonization surveillance program. It is not intended to diagnose MRSA infection nor to guide or monitor treatment for MRSA infections. Test performance is not FDA approved in patients less than 54 years old. Performed at [facility], [address] [place], [place] [address]          Radiology Studies: VAS [place] LOWER EXTREMITY VENOUS (DVT) Result Date: 07/17/2023  Lower Venous DVT Study Patient Name:  Tracy Malone  Date of Exam:   07/17/2023 Medical Rec #: [id number]        Accession #:    [id number] Date of Birth: [birth year]         Patient Gender: F Patient Age:   43 years Exam Location:  [facility] Procedure:      VAS [place] LOWER EXTREMITY VENOUS (DVT) Referring Phys: [facility] GOEL --------------------------------------------------------------------------------  Indications: Swelling.  Risk Factors: Trauma. Limitations: Poor ultrasound/tissue interface and  patient positioning, patient immobility, patient pain tolerance. Comparison Study: No prior studies. Performing Technologist: Chanda Busing RVT  Examination Guidelines: A complete evaluation includes B-mode imaging, spectral Doppler, color Doppler, and power Doppler as needed of all accessible portions of each vessel. Bilateral testing is considered an integral part of a complete examination. Limited examinations for reoccurring indications may be performed as noted. The reflux portion of the exam is performed with the patient in reverse Trendelenburg.  +---------+---------------+---------+-----------+----------+--------------+ RIGHT    CompressibilityPhasicitySpontaneityPropertiesThrombus Aging +---------+---------------+---------+-----------+----------+--------------+ CFV      Full           Yes      Yes                                 +---------+---------------+---------+-----------+----------+--------------+ SFJ      Full                                                        +---------+---------------+---------+-----------+----------+--------------+ FV Prox  Full                                                        +---------+---------------+---------+-----------+----------+--------------+ FV Mid   Full                                                        +---------+---------------+---------+-----------+----------+--------------+  FV DistalFull                                                        +---------+---------------+---------+-----------+----------+--------------+ PFV      Full                                                        +---------+---------------+---------+-----------+----------+--------------+ POP      Full           Yes      Yes                                 +---------+---------------+---------+-----------+----------+--------------+ PTV      Full                                                         +---------+---------------+---------+-----------+----------+--------------+ PERO     Full                                                        +---------+---------------+---------+-----------+----------+--------------+   +---------+---------------+---------+-----------+----------+--------------+ LEFT     CompressibilityPhasicitySpontaneityPropertiesThrombus Aging +---------+---------------+---------+-----------+----------+--------------+ CFV      Full           Yes      Yes                                 +---------+---------------+---------+-----------+----------+--------------+ SFJ      Full                                                        +---------+---------------+---------+-----------+----------+--------------+ FV Prox  Full                                                        +---------+---------------+---------+-----------+----------+--------------+ FV Mid   Full                                                        +---------+---------------+---------+-----------+----------+--------------+ FV DistalFull                                                        +---------+---------------+---------+-----------+----------+--------------+  PFV      Full                                                        +---------+---------------+---------+-----------+----------+--------------+ POP      Full           Yes      Yes                                 +---------+---------------+---------+-----------+----------+--------------+ PTV      Full                                                        +---------+---------------+---------+-----------+----------+--------------+ PERO     Full                                                        +---------+---------------+---------+-----------+----------+--------------+     Summary: RIGHT: - There is no evidence of deep vein thrombosis in the lower extremity.  - No cystic structure found in  the popliteal fossa.  LEFT: - There is no evidence of deep vein thrombosis in the lower extremity.  - No cystic structure found in the popliteal fossa.  *See table(s) above for measurements and observations. Electronically signed by Gerarda Fraction on 07/17/2023 at 11:46:13 AM.    Final         Scheduled Meds:  calcium carbonate  1 tablet Oral BID   carbidopa-levodopa  1 tablet Oral TID AC   feeding supplement  237 mL Oral BID BM   levothyroxine  25 mcg Oral Q0600   multivitamin with minerals  1 tablet Oral Daily   pantoprazole (PROTONIX) IV  40 mg Intravenous Q12H   senna-docusate  2 tablet Oral BID   sodium chloride flush  3 mL Intravenous Q12H   Continuous Infusions:  ampicillin-sulbactam (UNASYN) IV 3 g ( 0102)          Glade Lloyd, MD [facility] , 7:34 AM

## 2023-07-19 NOTE — Progress Notes (Signed)
On assessment of pt she appeared pale, c/o dizziness, shortness of breath, decrease b/p, increase RR and HR. Pt also noted to have maroon loose stool with clots. NP notified for possible order for H/H. NP replied with H/H order along with other orders. Patient was able to sign consent for blood transfusion with an X do to injury to right shoulder. New IV placed.

## 2023-07-19 NOTE — Progress Notes (Addendum)
    Patient Name: ZEAH VANCLEAVE           DOB: [birth year]  MRN: [id number]       OVERNIGHT EVENT    Notified by RN that patient has expired at 2200.  2 RN verified. Patient was comfort care.   Family has been notified.    Anthoney Harada, DNP, ACNPC- AG [facility]

## 2023-07-19 NOTE — Progress Notes (Signed)
I was notified by the nurse about patient's active rectal bleeding, hypotension.  Tagged RBC scan showed active possible colon bleeding.  GI team has discussed this finding with patient's daughter.  I was informed by the nurse that patient's blood pressure was in the 70s after she returned from the RBC-Scan with her requiring 6 L oxygen by nasal cannula along with constant rectal bleeding.  I saw her at bedside and she is constantly moaning and groaning.  I called the daughter/Lisa on phone and explained the overall very poor prognosis with increasing WBC count, increasing oxygen requirement, active GI bleeding, hypotension and possible futility of care.  Daughter is agreeable to proceed with comfort measures.  I will discontinue blood transfusion ordered, future blood draws and further investigation.  Family is on the way and after they see the patient, if they want to start continuous Morphine drip, we can proceed with this. Total time spent for the same was more than 30 minutes.

## 2023-07-20 LAB — BPAM RBC
Blood Product Expiration Date: 202501192359
Blood Product Expiration Date: 202501192359
Blood Product Expiration Date: 202501252359
Blood Product Expiration Date: 202501262359
Blood Product Expiration Date: 202501262359
Blood Product Expiration Date: 202501262359
ISSUE DATE / TIME: 202412260147
ISSUE DATE / TIME: 202412260431
Unit Type and Rh: 1700
Unit Type and Rh: 5100
Unit Type and Rh: 5100
Unit Type and Rh: 5100
Unit Type and Rh: 7300
Unit Type and Rh: 7300

## 2023-07-20 LAB — TYPE AND SCREEN
ABO/RH(D): B POS
Antibody Screen: NEGATIVE
Unit division: 0
Unit division: 0
Unit division: 0
Unit division: 0
Unit division: 0
Unit division: 0

## 2023-07-21 LAB — CULTURE, BLOOD (ROUTINE X 2): Culture: NO GROWTH

## 2023-07-22 LAB — CULTURE, BLOOD (ROUTINE X 2): Culture: NO GROWTH

## 2023-07-23 ENCOUNTER — Encounter: Payer: BLUE CROSS/BLUE SHIELD | Admitting: Orthopedic Surgery

## 2023-07-25 NOTE — Discharge Summary (Signed)
Death Summary  Tracy Malone ZOX:[id number] DOB: 07/11/1932 DOA: 2023-07-17  PCP: Tracy Northern, MD  Admit date: 07-17-2023 Date of Death: 06/26/2023 Time of Death: July 29, 2198   History of present illness:   89 y.o. female with medical history significant of Parkinson's disease, suspected dementia, on hospice for failure to thrive prior to recent hospitalization from 07/05/2023 - 07/11/2023 for right hip fracture and right humerus fracture requiring percutaneous screw fixation of right hip fracture on 07/07/2023 with subsequent discharge to SNF.  She presented with increasing lethargy/confusion/agitation and low blood pressure.  On presentation, she was found to be hypoxic and chest x-ray showed possible pneumonia.  She was started on IV antibiotics.  During the hospitalizing, her condition did not improve with worsening respiratory status and leukocytosis.  She started having possibly lower GI bleeding with maroon stools with clots overnight of 07/18/2023 which continued through 07/01/2023 with drop in hemoglobin requiring 2 units packed red cell transfusion.  Tagged RBC scan showed possible active colonic bleeding.  Because of overall very poor prognosis and worsening clinical condition, family decided to pursue comfort measures.  Patient expired on 07/23/2023 at 2200 hrs.  Family was notified.  Final Diagnoses:   Comfort measures only status Possible bilateral pneumonia with concern for aspiration pneumonia Acute respiratory failure with hypoxia Possible lower GI bleeding Leukocytosis Anemia of chronic disease Thrombocytosis Recent closed fracture of neck of right femur Recent right humerus fracture Parkinson's disease Possible Parkinson's dementia Tremors Hypothyroidism Osteopenia Anxiety   The results of significant diagnostics from this hospitalization (including imaging, microbiology, ancillary and laboratory) are listed below for reference.    Significant Diagnostic Studies: NM GI  Blood Loss Result Date: Jul 20, 2023 CLINICAL DATA:  Gastrointestinal bleeding EXAM: NUCLEAR MEDICINE GASTROINTESTINAL BLEEDING SCAN TECHNIQUE: Sequential abdominal images were obtained following intravenous administration of Tc-72m labeled red blood cells. RADIOPHARMACEUTICALS:  22.0 mCi Tc-64m pertechnetate in-vitro labeled red cells. COMPARISON:  CT abdomen from 05/24/2023 FINDINGS: The patient is moderately rotated to the right on today's examination. Starting at about 45 minutes after the initiation of imaging, we demonstrate abnormal activity compatible with active bleeding in bowel just below the left hepatic lobe. My sense is that this is probably within the transverse colon given the pattern of activity appearing to extend into the left colon and potentially sigmoid colon; distal gastric or proximal small bowel source possible but considered less likely given the pattern of spread. IMPRESSION: 1. Active GI bleeding starting about 45 minutes into the study. This initiates just below the left hepatic lobe. Given the pattern of distal spread I favor transverse colon source over a gastric or proximal duodenal source, although the rightward rotation during imaging does complicate this assessment. If the patient were to have a bloody nasogastric tube aspirate to favor upper GI source then clearly a distal gastric source would become favored. Electronically Signed   By: Tracy Malone M.D.   On: 07/24/2023 16:23   [place] EKG SITE RITE Result Date: 07/05/2023 If Site Rite image not attached, placement could not be confirmed due to current cardiac rhythm.  DG CHEST PORT 1 VIEW Result Date: 07/24/2023 CLINICAL DATA:  Dyspnea. EXAM: PORTABLE CHEST 1 VIEW COMPARISON:  07-17-2023. FINDINGS: The heart size and mediastinal contours are within normal limits. Patient is rotated to the right. Mild bibasilar subsegmental atelectasis or scarring is noted. The visualized skeletal structures are unremarkable.  IMPRESSION: Mild bibasilar subsegmental atelectasis or scarring. Electronically Signed   By: Tracy Malone M.D.   On:   11:25   VAS [place] LOWER EXTREMITY VENOUS (DVT) Result Date: 07/17/2023  Lower Venous DVT Study Patient Name:  Tracy Malone  Date of Exam:   07/17/2023 Medical Rec #: [id number]        Accession #:    [id number] Date of Birth: [birth year]         Patient Gender: F Patient Age:   89 years Exam Location:  [facility] Procedure:      VAS [place] LOWER EXTREMITY VENOUS (DVT) Referring Phys: [facility] GOEL --------------------------------------------------------------------------------  Indications: Swelling.  Risk Factors: Trauma. Limitations: Poor ultrasound/tissue interface and patient positioning, patient immobility, patient pain tolerance. Comparison Study: No prior studies. Performing Technologist: Tracy Malone RVT  Examination Guidelines: A complete evaluation includes B-mode imaging, spectral Doppler, color Doppler, and power Doppler as needed of all accessible portions of each vessel. Bilateral testing is considered an integral part of a complete examination. Limited examinations for reoccurring indications may be performed as noted. The reflux portion of the exam is performed with the patient in reverse Trendelenburg.  +---------+---------------+---------+-----------+----------+--------------+ RIGHT    CompressibilityPhasicitySpontaneityPropertiesThrombus Aging +---------+---------------+---------+-----------+----------+--------------+ CFV      Full           Yes      Yes                                 +---------+---------------+---------+-----------+----------+--------------+ SFJ      Full                                                        +---------+---------------+---------+-----------+----------+--------------+ FV Prox  Full                                                         +---------+---------------+---------+-----------+----------+--------------+ FV Mid   Full                                                        +---------+---------------+---------+-----------+----------+--------------+ FV DistalFull                                                        +---------+---------------+---------+-----------+----------+--------------+ PFV      Full                                                        +---------+---------------+---------+-----------+----------+--------------+ POP      Full           Yes      Yes                                 +---------+---------------+---------+-----------+----------+--------------+  PTV      Full                                                        +---------+---------------+---------+-----------+----------+--------------+ PERO     Full                                                        +---------+---------------+---------+-----------+----------+--------------+   +---------+---------------+---------+-----------+----------+--------------+ LEFT     CompressibilityPhasicitySpontaneityPropertiesThrombus Aging +---------+---------------+---------+-----------+----------+--------------+ CFV      Full           Yes      Yes                                 +---------+---------------+---------+-----------+----------+--------------+ SFJ      Full                                                        +---------+---------------+---------+-----------+----------+--------------+ FV Prox  Full                                                        +---------+---------------+---------+-----------+----------+--------------+ FV Mid   Full                                                        +---------+---------------+---------+-----------+----------+--------------+ FV DistalFull                                                         +---------+---------------+---------+-----------+----------+--------------+ PFV      Full                                                        +---------+---------------+---------+-----------+----------+--------------+ POP      Full           Yes      Yes                                 +---------+---------------+---------+-----------+----------+--------------+ PTV      Full                                                        +---------+---------------+---------+-----------+----------+--------------+  PERO     Full                                                        +---------+---------------+---------+-----------+----------+--------------+     Summary: RIGHT: - There is no evidence of deep vein thrombosis in the lower extremity.  - No cystic structure found in the popliteal fossa.  LEFT: - There is no evidence of deep vein thrombosis in the lower extremity.  - No cystic structure found in the popliteal fossa.  *See table(s) above for measurements and observations. Electronically signed by Gerarda Fraction on 07/17/2023 at 11:46:13 AM.    Final    DG Chest 2 View Result Date: 07/16/2023 CLINICAL DATA:  Worsening cough. EXAM: CHEST - 2 VIEW COMPARISON:  07/05/2023 FINDINGS: The heart size and mediastinal contours are within normal limits. Pulmonary hyperinflation again seen, consistent with COPD. New infiltrates are seen in both lower lobes and inferior right upper lobe, consistent with pneumonia. No pleural effusion. IMPRESSION: New bilateral lower lobe and inferior right upper lobe infiltrates, consistent with pneumonia. COPD. Electronically Signed   By: Danae Orleans M.D.   On: 07/16/2023 13:39   DG Swallowing Func-Speech Pathology Result Date: 07/09/2023 Table formatting from the original result was not included. Modified Barium Swallow Study Patient Details Name: MARIESSA ELZEY MRN: [id number] Date of Birth: Nov 15, 1931 Today's Date: 07/09/2023 HPI/PMH: HPI: JESSYE HOELZEL is  a 89 y.o. female with medical history significant of carotid stenosis, fibromyalgia, hypothyroidism, and Parkinson's who presented on 12/12 with a fall.  She has a R humerus and R hip fracture.  Patient has been on home hospice for FTT.  She got up today and had a mechanical fall, landing on R side and injuring both her R arm and leg. Pt underwent surgery for right femoral neck fracture on 12/14. Clinical Impression: Clinical Impression: Pt demonstrated moderate oropharyngeal dysphagia. Suspect pt had a baseline dysphagia that is now exacerbated with deconditioned state. She was able to maintain an orally cohesive bolus and transit briskly however her tongue base retraction and pharyngeal wall contraction and stripping wave were reduced leaving residue in posterior pharynx and valleculae that she frequently brought back up into oral cavity and reswallowed. Material briefly entered area between soft palate and pharyngeal wall. She could achieve adequate laryngeal elevation but anterior excursion was reduced. Her epiglottic deflection was decreased reaching only horizontal position. Pt silently aspirated thin liquids during multiple swallows in attempts to clear diffuse pharyngeal residue. Cue for hard cough could not clear aspirate and and a chin tuck was ineffective to prevent aspiration. Nectar thick penetrated (PAS 3) with reflexive throat clear that cleared penetrate. Pt did aspirate nectar when using a straw from the pyriform sinus residue. Verbal cues for second swallow intermittently reduced pharyngeal residue to min-mild. Honey thick was silently aspirated from swallow of pyriform sinus residue. She was able to clear esophagus seen on esophageal scan. Recommend regular texture, nectar thick liquids, no straws, swallow twice, throat clear/cough during meals and crush meds. ST will continue to follow. Factors that may increase risk of adverse event in presence of aspiration Rubye Oaks & Clearance Coots 2021): Factors that  may increase risk of adverse event in presence of aspiration Rubye Oaks & Clearance Coots 2021): Frail or deconditioned Recommendations/Plan: Swallowing Evaluation Recommendations Swallowing Evaluation Recommendations Recommendations: PO diet PO Diet  Recommendation: Regular; Mildly thick liquids (Level 2, nectar thick) Liquid Administration via: No straw; Cup Medication Administration: Crushed with puree Supervision: Patient able to self-feed; Full supervision/cueing for swallowing strategies Swallowing strategies  : Slow rate; Small bites/sips; Multiple dry swallows after each bite/sip; Clear throat intermittently Postural changes: Position pt fully upright for meals Oral care recommendations: Oral care BID (2x/day) Caregiver Recommendations: Have oral suction available Treatment Plan Treatment Plan Treatment recommendations: Therapy as outlined in treatment plan below Follow-up recommendations: Outpatient SLP Functional status assessment: Patient has had a recent decline in their functional status and demonstrates the ability to make significant improvements in function in a reasonable and predictable amount of time. Treatment frequency: Min 2x/week Treatment duration: 2 weeks Interventions: Aspiration precaution training; Oropharyngeal exercises; Compensatory techniques; Patient/family education; Trials of upgraded texture/liquids; Diet toleration management by SLP Recommendations Recommendations for follow up therapy are one component of a multi-disciplinary discharge planning process, led by the attending physician.  Recommendations may be updated based on patient status, additional functional criteria and insurance authorization. Assessment: Orofacial Exam: Orofacial Exam Oral Cavity: Oral Hygiene: WFL Oral Cavity - Dentition: Adequate natural dentition Orofacial Anatomy: WFL Oral Motor/Sensory Function: WFL Anatomy: Anatomy: WFL Boluses Administered: Boluses Administered Boluses Administered: Thin liquids (Level 0);  Mildly thick liquids (Level 2, nectar thick); Moderately thick liquids (Level 3, honey thick); Puree; Solid  Oral Impairment Domain: Oral Impairment Domain Lip Closure: No labial escape Tongue control during bolus hold: Cohesive bolus between tongue to palatal seal Bolus preparation/mastication: Timely and efficient chewing and mashing Bolus transport/lingual motion: Brisk tongue motion Oral residue: Trace residue lining oral structures Location of oral residue : Tongue Initiation of pharyngeal swallow : Valleculae  Pharyngeal Impairment Domain: Pharyngeal Impairment Domain Soft palate elevation: Trace column of contrast or air between SP and PW Laryngeal elevation: Complete superior movement of thyroid cartilage with complete approximation of arytenoids to epiglottic petiole Anterior hyoid excursion: Partial anterior movement Epiglottic movement: Partial inversion Laryngeal vestibule closure: Incomplete, narrow column air/contrast in laryngeal vestibule Pharyngeal stripping wave : Present - diminished Pharyngeal contraction (A/P view only): N/A Pharyngoesophageal segment opening: Partial distention/partial duration, partial obstruction of flow Tongue base retraction: Wide column of contrast or air between tongue base and PPW Pharyngeal residue: Collection of residue within or on pharyngeal structures Location of pharyngeal residue: Valleculae; Pyriform sinuses  Esophageal Impairment Domain: Esophageal Impairment Domain Esophageal clearance upright position: Complete clearance, esophageal coating Pill: No data recorded Penetration/Aspiration Scale Score: Penetration/Aspiration Scale Score 1.  Material does not enter airway: Solid; Puree 2.  Material enters airway, remains ABOVE vocal cords then ejected out: Mildly thick liquids (Level 2, nectar thick) 3.  Material enters airway, remains ABOVE vocal cords and not ejected out: Moderately thick liquids (Level 3, honey thick); Thin liquids (Level 0) 8.  Material enters  airway, passes BELOW cords without attempt by patient to eject out (silent aspiration) : Thin liquids (Level 0); Mildly thick liquids (Level 2, nectar thick) (nectar aspirated with a straw) Compensatory Strategies: Compensatory Strategies Compensatory strategies: Yes Straw: Ineffective Ineffective Straw: Mildly thick liquid (Level 2, nectar thick) Multiple swallows: Effective Chin tuck: Ineffective Ineffective Chin Tuck: Thin liquid (Level 0)   General Information: Caregiver present: No  Diet Prior to this Study: Regular; Thin liquids (Level 0); Other (Comment) (per MD)   Temperature : Normal   Respiratory Status: WFL   Supplemental O2: None (Room air)   History of Recent Intubation: No (not intubated for surgery per notes and from daughter)  Behavior/Cognition: Alert;  Cooperative; Pleasant mood; Requires cueing Self-Feeding Abilities: Able to self-feed Baseline vocal quality/speech: Dysphonic Volitional Cough: Able to elicit Volitional Swallow: Able to elicit Exam Limitations: No limitations Goal Planning: Prognosis for improved oropharyngeal function: -- (fair-good) No data recorded No data recorded Patient/Family Stated Goal: To eat/drink comfortably Consulted and agree with results and recommendations: Patient; Family member/caregiver Pain: Pain Assessment Pain Assessment: Faces Faces Pain Scale: 2 Pain Location: back Pain Descriptors / Indicators: Grimacing; Discomfort Pain Intervention(s): Monitored during session End of Session: Start Time:SLP Start Time (ACUTE ONLY): 0927 Stop Time: SLP Stop Time (ACUTE ONLY): 0950 Time Calculation:SLP Time Calculation (min) (ACUTE ONLY): 23 min Charges: SLP Evaluations $ SLP Speech Visit: 1 Visit SLP Evaluations $BSS Swallow: 1 Procedure $MBS Swallow: 1 Procedure SLP visit diagnosis: SLP Visit Diagnosis: Dysphagia, oropharyngeal phase (R13.12) Past Medical History: Past Medical History: Diagnosis Date  Acute lower UTI 11/22/2018  Bell's palsy 1966  Hx: right side facial  droop, resolved per patient 04/02/19  Carotid artery disease (HCC) 2010  on vascular screening;unchanged 2013.(could not tolerate simvastatin, no other statins tried)--<30% blockage bilat 07/2011  Claustrophobia   Depression   treated in the past for years;stopped in 2010 for a years  Duodenal ulcer 1962  h/o  Dysrhythmia   ocassional PVC's, no current problems per patient on 04/02/19  Fibromyalgia   chronic fatigue, chronic abdominal pain  Frequent PVCs 07/2012  Seen by [facility]: benign, asymptomatic, normal EF  Gallstones 02/2021  on [place]  GERD (gastroesophageal reflux disease)   diet controlled  Glaucoma, narrow-angle   s/p laser surgery  History of hiatal hernia   during endoscopy  Hypothyroid 03/2007  IBS (irritable bowel syndrome)   Dr. Elnoria Howard  Ischemic colitis (HCC) 11/21/2018  no current problems per patient on 04/02/19  Lichenoid keratosis 12/03/2020  Dr.Stinehelfer  Ocular migraine   Osteoporosis 04/2010  Dr.Hawkes; later consulted Dr. Sharl Ma (2022)  Panic attack   Parkinson's disease (HCC) 06/23/2016  Recurrent UTI   has cystocele-Dr.Grewal  Shingles 1999  h/o  Superficial thrombophlebitis 03/2009  RLE  Trochanteric bursitis 12/2008  bilateral Past Surgical History: Past Surgical History: Procedure Laterality Date  ABDOMINAL HYSTERECTOMY    BACK SURGERY  05/2019  CATARACT EXTRACTION, BILATERAL  1995, 1996  EYE SURGERY Bilateral   laser - glaucoma  Flexible sigmoidoscopy    FRACTURE SURGERY  2020, Sept.  12th (!)  KYPHOPLASTY N/A 04/03/2019  Procedure: KYPHOPLASTY L1;  Surgeon: Venita Lick, MD;  Location: [facility] OR;  Service: Orthopedics;  Laterality: N/A;  90 mins  RADIOFREQUENCY ABLATION Bilateral 11/14/2022  L3-4, L4-5 bilateral ([facility])  THYROIDECTOMY, PARTIAL  09/2005  L nodule; Dr. Gerrit Friends  TONSILLECTOMY  1946  UPPER GI ENDOSCOPY  06/27/2012  VAGINAL HYSTERECTOMY  1971  and bladder repair.  Still has ovaries  WISDOM TOOTH EXTRACTION   Royce Macadamia 07/09/2023, 11:33 AM  DG Pelvis 1-2  Views Result Date: 07/07/2023 CLINICAL DATA:  Postoperative. Status post right proximal femoral ORIF. EXAM: PELVIS - 1-2 VIEW COMPARISON:  AP bilateral hips and lateral view of the right hip 07/05/2023 FINDINGS: There is diffuse decreased bone mineralization. Interval ORIF of the previously seen subcapital proximal femoral neck fracture with 3 screws. No hardware complication is seen. Mild bilateral superior femoroacetabular joint space narrowing and peripheral osteophytosis, similar to prior. Mild bilateral sacroiliac subchondral sclerosis. IMPRESSION: Interval ORIF of the previously seen subcapital proximal right femoral neck fracture. No hardware complication is seen. Electronically Signed   By: Neita Garnet M.D.   On: 07/07/2023  14:48   DG HIP UNILAT WITH PELVIS 2-3 VIEWS RIGHT Result Date: 07/07/2023 CLINICAL DATA:  Elective surgery. Narrowing of right femoral neck fracture. Intraoperative fluoroscopy. EXAM: DG HIP (WITH OR WITHOUT PELVIS) 2-3V RIGHT COMPARISON:  Right hip and femur radiographs 07/05/2023 FINDINGS: Images were performed intraoperatively without the presence of a radiologist. There are 3 new screws fixating the previously seen right subcapital acute fracture. No hardware complication is seen. Total fluoroscopy images: 3 Total fluoroscopy time: 101 seconds Total dose: Radiation Exposure Index (as provided by the fluoroscopic device): 5.77 mGy air Kerma Please see intraoperative findings for further detail. IMPRESSION: Intraoperative fluoroscopy for right femoral neck fracture fixation. Electronically Signed   By: Neita Garnet M.D.   On: 07/07/2023 14:47   DG C-Arm 1-60 Min-No Report Result Date: 07/07/2023 Fluoroscopy was utilized by the requesting physician.  No radiographic interpretation.   DG C-Arm 1-60 Min-No Report Result Date: 07/07/2023 Fluoroscopy was utilized by the requesting physician.  No radiographic interpretation.   CT Hip Right Wo Contrast Result Date:  07/05/2023 CLINICAL DATA:  Recent fall with leg pain, initial encounter EXAM: CT OF THE RIGHT HIP WITHOUT CONTRAST TECHNIQUE: Multidetector CT imaging of the right hip was performed according to the standard protocol. Multiplanar CT image reconstructions were also generated. RADIATION DOSE REDUCTION: This exam was performed according to the departmental dose-optimization program which includes automated exposure control, adjustment of the mA and/or kV according to patient size and/or use of iterative reconstruction technique. COMPARISON:  Films from earlier in the same day. FINDINGS: Bones/Joint/Cartilage There is an right impacted subcapital femoral neck fracture identified. No dislocation is seen. No intratrochanteric component is noted. No other fractures are seen. No joint effusion is noted. Ligaments Suboptimally assessed by CT. Muscles and Tendons Surrounding musculature appears within normal limits. Soft tissues No soft tissue abnormality is noted. The visualized pelvic structures are within limits. IMPRESSION: Impacted subcapital femoral neck fracture on the right. Electronically Signed   By: Alcide Clever M.D.   On: 07/05/2023 23:59   DG HIP PORT UNILAT WITH PELVIS 1V RIGHT Result Date: 07/05/2023 CLINICAL DATA:  Fall, pain EXAM: DG HIP (WITH OR WITHOUT PELVIS) 1V PORT RIGHT COMPARISON:  None Available. FINDINGS: No definite femoral neck fracture seen on this single view. AP view on the pelvis series is concerning for impacted femoral neck fracture. No subluxation or dislocation. IMPRESSION: No definite fracture seen on this single view. Consider further evaluation with CT given the appearance on pelvis series. Electronically Signed   By: Charlett Nose M.D.   On: 07/05/2023 23:29   DG Shoulder Right Result Date: 07/05/2023 CLINICAL DATA:  Right shoulder pain, fall EXAM: RIGHT SHOULDER - 2+ VIEW COMPARISON:  None Available. FINDINGS: Proximal right humeral fracture involving the right humeral neck and  greater tuberosity region. No subluxation or dislocation. IMPRESSION: Right humeral neck/head fracture. Electronically Signed   By: Charlett Nose M.D.   On: 07/05/2023 23:27   CT Cervical Spine Wo Contrast Result Date: 07/05/2023 CLINICAL DATA:  Neck trauma (Age >= 65y).  Fall. EXAM: CT CERVICAL SPINE WITHOUT CONTRAST TECHNIQUE: Multidetector CT imaging of the cervical spine was performed without intravenous contrast. Multiplanar CT image reconstructions were also generated. RADIATION DOSE REDUCTION: This exam was performed according to the departmental dose-optimization program which includes automated exposure control, adjustment of the mA and/or kV according to patient size and/or use of iterative reconstruction technique. COMPARISON:  None available FINDINGS: Alignment: Normal Skull base and vertebrae: No acute fracture. No primary bone  lesion or focal pathologic process. Soft tissues and spinal canal: No prevertebral fluid or swelling. No visible canal hematoma. Disc levels: Mild diffuse degenerative disc disease and facet disease. Upper chest: No acute findings Other: None IMPRESSION: No acute bony abnormality. Electronically Signed   By: Charlett Nose M.D.   On: 07/05/2023 23:23   CT Head Wo Contrast Result Date: 07/05/2023 CLINICAL DATA:  Head trauma, minor (Age >= 65y).  Fall. EXAM: CT HEAD WITHOUT CONTRAST TECHNIQUE: Contiguous axial images were obtained from the base of the skull through the vertex without intravenous contrast. RADIATION DOSE REDUCTION: This exam was performed according to the departmental dose-optimization program which includes automated exposure control, adjustment of the mA and/or kV according to patient size and/or use of iterative reconstruction technique. COMPARISON:  None Available. FINDINGS: Brain: There is atrophy and chronic small vessel disease changes. No acute intracranial abnormality. Specifically, no hemorrhage, hydrocephalus, mass lesion, acute infarction, or  significant intracranial injury. Vascular: No hyperdense vessel or unexpected calcification. Skull: No acute calvarial abnormality. Sinuses/Orbits: No acute findings Other: None IMPRESSION: Atrophy, chronic microvascular disease. No acute intracranial abnormality. Electronically Signed   By: Charlett Nose M.D.   On: 07/05/2023 23:21   DG Pelvis Portable Result Date: 07/05/2023 CLINICAL DATA:  Fall EXAM: PORTABLE PELVIS 1-2 VIEWS COMPARISON:  None Available. FINDINGS: There is foreshortening of the right femoral neck concerning for impacted fracture. No subluxation or dislocation. IMPRESSION: Concern for impacted right femoral neck fracture. Electronically Signed   By: Charlett Nose M.D.   On: 07/05/2023 23:09   DG Hand Complete Right Result Date: 07/05/2023 CLINICAL DATA:  Fall, right side pain EXAM: RIGHT HAND - COMPLETE 3+ VIEW COMPARISON:  None Available. 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 Diffuse osteopenia. No acute bony abnormality. Specifically, no fracture, subluxation, or dislocation. Soft tissues are intact. IMPRESSION: No acute bony abnormality. Electronically Signed   By: Charlett Nose M.D.   On: 07/05/2023 23:04   DG Forearm Right Result Date: 07/05/2023 CLINICAL DATA:  Fall, right side pain EXAM: RIGHT FOREARM - 2 VIEW COMPARISON:  None Available. FINDINGS: There is no evidence of fracture or other focal bone lesions. Soft tissues are unremarkable. IMPRESSION: Negative. Electronically Signed   By: Charlett Nose M.D.   On: 07/05/2023 23:04   DG Humerus Right Result Date: 07/05/2023 CLINICAL DATA:  Fall, right side pain EXAM: RIGHT HUMERUS - 2+ VIEW COMPARISON:  None Available. FINDINGS: There is a proximal right humeral fracture involving the greater tuberosity region of the humeral head. Extension across the humeral neck suspected but difficult to visualize. No subluxation or dislocation. IMPRESSION: Proximal right humeral fracture involving the greater tuberosity and  possibly humeral neck. Electronically Signed   By: Charlett Nose M.D.   On: 07/05/2023 23:03   DG Femur 1 View Right Result Date: 07/05/2023 CLINICAL DATA:  Fall, pain EXAM: RIGHT FEMUR 1 VIEW COMPARISON:  None Available. FINDINGS: Limited single view of the right femur. No visible fracture, subluxation or dislocation. IMPRESSION: Limited study.  No visible acute bony abnormality. Electronically Signed   By: Charlett Nose M.D.   On: 07/05/2023 23:03   DG Chest Portable 1 View Result Date: 07/05/2023 CLINICAL DATA:  Fall EXAM: PORTABLE CHEST 1 VIEW COMPARISON:  01/30/2022 FINDINGS: Heart and mediastinal contours are within normal limits. No focal opacities or effusions. Proximal right humeral fracture in the region of the humeral neck/head. See humerus series report. IMPRESSION: No acute cardiopulmonary disease. Proximal right humeral fracture. Electronically Signed  By: Charlett Nose M.D.   On: 07/05/2023 23:02    Microbiology: Recent Results (from the past 240 hours)  SARS Coronavirus 2 by RT PCR (hospital order, performed in [facility] hospital lab) *cepheid single result test* Anterior Nasal Swab     Status: None   Collection Time: 07/16/23  9:59 PM   Specimen: Anterior Nasal Swab  Result Value Ref Range Status   SARS Coronavirus 2 by RT PCR NEGATIVE NEGATIVE Final    Comment: Performed at [facility], [address], [place], [place] [address]  Respiratory (~20 pathogens) panel by PCR     Status: None   Collection Time: 07/16/23  9:59 PM   Specimen: Nasopharyngeal Swab; Respiratory  Result Value Ref Range Status   Adenovirus NOT DETECTED NOT DETECTED Final   Coronavirus 229E NOT DETECTED NOT DETECTED Final    Comment: (NOTE) The Coronavirus on the Respiratory Panel, DOES NOT test for the novel  Coronavirus (2019 nCoV)    Coronavirus HKU1 NOT DETECTED NOT DETECTED Final   Coronavirus NL63 NOT DETECTED NOT DETECTED Final   Coronavirus OC43 NOT DETECTED NOT DETECTED Final    Metapneumovirus NOT DETECTED NOT DETECTED Final   Rhinovirus / Enterovirus NOT DETECTED NOT DETECTED Final   Influenza A NOT DETECTED NOT DETECTED Final   Influenza B NOT DETECTED NOT DETECTED Final   Parainfluenza Virus 1 NOT DETECTED NOT DETECTED Final   Parainfluenza Virus 2 NOT DETECTED NOT DETECTED Final   Parainfluenza Virus 3 NOT DETECTED NOT DETECTED Final   Parainfluenza Virus 4 NOT DETECTED NOT DETECTED Final   Respiratory Syncytial Virus NOT DETECTED NOT DETECTED Final   Bordetella pertussis NOT DETECTED NOT DETECTED Final   Bordetella Parapertussis NOT DETECTED NOT DETECTED Final   Chlamydophila pneumoniae NOT DETECTED NOT DETECTED Final   Mycoplasma pneumoniae NOT DETECTED NOT DETECTED Final    Comment: Performed at [facility], [address], [place], [place] [address]  Culture, blood (Routine X 2) w Reflex to ID Panel     Status: None (Preliminary result)   Collection Time: 07/16/23 10:12 PM   Specimen: BLOOD  Result Value Ref Range Status   Specimen Description   Final    BLOOD LEFT ANTECUBITAL Performed at [facility], [address], [place], [place] [address]    Special Requests   Final    BOTTLES DRAWN AEROBIC AND ANAEROBIC Blood Culture results may not be optimal due to an inadequate volume of blood received in culture bottles Performed at [facility], [address], [place], [place] [address]    Culture   Final    NO GROWTH 4 DAYS Performed at [facility], [address], [place], [place] [address]    Report Status PENDING  Incomplete  Culture, blood (Routine X 2) w Reflex to ID Panel     Status: None (Preliminary result)   Collection Time: 07/17/23 12:28 AM   Specimen: BLOOD LEFT ARM  Result Value Ref Range Status   Specimen Description   Final    BLOOD LEFT ARM Performed at [facility], [address], [place], [place] [address]    Special Requests   Final    BOTTLES DRAWN AEROBIC ONLY Blood Culture  results may not be optimal due to an inadequate volume of blood received in culture bottles Performed at [facility], [address], [place], [place] [address]    Culture   Final  NO GROWTH 3 DAYS Performed at [facility], [address] [place], [place] [address]    Report Status PENDING  Incomplete  MRSA Next Gen by PCR, Nasal     Status: None   Collection Time: 07/17/23 12:35 AM   Specimen: Nasal Mucosa; Nasal Swab  Result Value Ref Range Status   MRSA by PCR Next Gen NOT DETECTED NOT DETECTED Final    Comment: (NOTE) The GeneXpert MRSA Assay (FDA approved for NASAL specimens only), is one component of a comprehensive MRSA colonization surveillance program. It is not intended to diagnose MRSA infection nor to guide or monitor treatment for MRSA infections. Test performance is not FDA approved in patients less than 34 years old. Performed at [facility], [address] [place], [place] [address]      Labs: Basic Metabolic Panel: Recent Labs  Lab 07/16/23 1156 07/17/23 0027 07/18/23 0359  0821  NA 144 143 142 152*  K 3.6 4.1 3.6 4.4  CL 107 110 107 118*  CO2 28 24 26 24   GLUCOSE 126* 117* 145* 133*  BUN 17 22 26* 51*  CREATININE 0.38* 0.48 0.51 0.77  CALCIUM 8.6* 8.1* 8.5* 7.7*  MG  --   --  2.1 2.1   Liver Function Tests: Recent Labs  Lab 07/16/23 1156 07/18/23 0359  AST 21 14*  ALT 7 13  ALKPHOS 67 60  BILITOT 1.2* 1.0  PROT 6.2* 5.3*  ALBUMIN 3.0* 2.4*   No results for input(s): "LIPASE", "AMYLASE" in the last 168 hours. No results for input(s): "AMMONIA" in the last 168 hours. CBC: Recent Labs  Lab 07/16/23 1156 07/17/23 0655 07/18/23 0359 07/18/23 2234  0821  1028  1730  WBC 17.3* 22.8* 23.4*  --  27.3*  --   --   NEUTROABS  --   --  20.8*  --  23.4*  --   --   HGB 11.8* 9.3* 7.9* 5.6* 9.0* 9.2* 7.5*  HCT 37.5 30.2* 25.2* 18.0* 26.5* 27.6* 23.5*  MCV 95.9  99.3 97.3  --  79.3*  --   --   PLT 524* 419* 468*  --  280  --   --    Cardiac Enzymes: No results for input(s): "CKTOTAL", "CKMB", "CKMBINDEX", "TROPONINI" in the last 168 hours. D-Dimer No results for input(s): "DDIMER" in the last 72 hours. BNP: Invalid input(s): "POCBNP" CBG: Recent Labs  Lab 07/16/23 1215  GLUCAP 111*   Anemia work up No results for input(s): "VITAMINB12", "FOLATE", "FERRITIN", "TIBC", "IRON", "RETICCTPCT" in the last 72 hours. Urinalysis    Component Value Date/Time   COLORURINE AMBER (A) 07/17/2023 0035   APPEARANCEUR CLOUDY (A) 07/17/2023 0035   LABSPEC 1.026 07/17/2023 0035   LABSPEC 1.010 06/20/2023 1710   PHURINE 5.0 07/17/2023 0035   GLUCOSEU NEGATIVE 07/17/2023 0035   HGBUR SMALL (A) 07/17/2023 0035   BILIRUBINUR NEGATIVE 07/17/2023 0035   BILIRUBINUR negative 06/20/2023 1710   BILIRUBINUR neg 09/06/2015 1334   KETONESUR NEGATIVE 07/17/2023 0035   PROTEINUR NEGATIVE 07/17/2023 0035   UROBILINOGEN 0.2 06/08/2023 1426   UROBILINOGEN 0.2 07/04/2013 1050   NITRITE POSITIVE (A) 07/17/2023 0035   LEUKOCYTESUR LARGE (A) 07/17/2023 0035   Sepsis Labs Recent Labs  Lab 07/16/23 1156 07/17/23 0655 07/18/23 0359  0821  WBC 17.3* 22.8* 23.4* 27.3*       SIGNED:  Glade Lloyd, MD  [facility] 07/12/2023, 8:48 AM

## 2023-07-25 DEATH — deceased

## 2023-08-07 ENCOUNTER — Ambulatory Visit: Payer: Medicare Other | Admitting: Nurse Practitioner

## 2023-08-09 ENCOUNTER — Ambulatory Visit: Payer: BLUE CROSS/BLUE SHIELD | Admitting: Dermatology

## 2023-08-20 ENCOUNTER — Telehealth: Payer: Medicare Other | Admitting: Adult Health

## 2023-09-05 ENCOUNTER — Ambulatory Visit: Payer: Medicare Other | Admitting: Neurology

## 2023-11-12 ENCOUNTER — Ambulatory Visit: Payer: Medicare Other | Admitting: Family Medicine
